# Patient Record
Sex: Male | Born: 1940 | Race: White | Hispanic: No | Marital: Single | Smoking: Never smoker
Health system: Southern US, Community
[De-identification: ages and names within clinical notes are randomized; demographics above are authoritative.]

## PROBLEM LIST (undated history)

## (undated) DIAGNOSIS — I10 Essential (primary) hypertension: Secondary | ICD-10-CM

## (undated) DIAGNOSIS — R7303 Prediabetes: Secondary | ICD-10-CM

## (undated) DIAGNOSIS — E785 Hyperlipidemia, unspecified: Secondary | ICD-10-CM

## (undated) DIAGNOSIS — R0609 Other forms of dyspnea: Secondary | ICD-10-CM

## (undated) DIAGNOSIS — N183 Chronic kidney disease, stage 3 unspecified: Secondary | ICD-10-CM

## (undated) DIAGNOSIS — M2550 Pain in unspecified joint: Secondary | ICD-10-CM

## (undated) DIAGNOSIS — G4730 Sleep apnea, unspecified: Secondary | ICD-10-CM

## (undated) DIAGNOSIS — F329 Major depressive disorder, single episode, unspecified: Secondary | ICD-10-CM

## (undated) DIAGNOSIS — M7989 Other specified soft tissue disorders: Secondary | ICD-10-CM

## (undated) DIAGNOSIS — N400 Enlarged prostate without lower urinary tract symptoms: Secondary | ICD-10-CM

## (undated) DIAGNOSIS — G5600 Carpal tunnel syndrome, unspecified upper limb: Secondary | ICD-10-CM

## (undated) DIAGNOSIS — F419 Anxiety disorder, unspecified: Secondary | ICD-10-CM

## (undated) DIAGNOSIS — R0600 Dyspnea, unspecified: Secondary | ICD-10-CM

## (undated) DIAGNOSIS — R0602 Shortness of breath: Secondary | ICD-10-CM

## (undated) DIAGNOSIS — M1990 Unspecified osteoarthritis, unspecified site: Secondary | ICD-10-CM

## (undated) DIAGNOSIS — M549 Dorsalgia, unspecified: Secondary | ICD-10-CM

## (undated) DIAGNOSIS — G709 Myoneural disorder, unspecified: Secondary | ICD-10-CM

## (undated) DIAGNOSIS — Z87442 Personal history of urinary calculi: Secondary | ICD-10-CM

## (undated) DIAGNOSIS — IMO0002 Reserved for concepts with insufficient information to code with codable children: Secondary | ICD-10-CM

## (undated) DIAGNOSIS — K219 Gastro-esophageal reflux disease without esophagitis: Secondary | ICD-10-CM

## (undated) DIAGNOSIS — J45909 Unspecified asthma, uncomplicated: Secondary | ICD-10-CM

## (undated) DIAGNOSIS — R1310 Dysphagia, unspecified: Secondary | ICD-10-CM

## (undated) DIAGNOSIS — Z8619 Personal history of other infectious and parasitic diseases: Secondary | ICD-10-CM

## (undated) DIAGNOSIS — T7840XA Allergy, unspecified, initial encounter: Secondary | ICD-10-CM

## (undated) DIAGNOSIS — K429 Umbilical hernia without obstruction or gangrene: Secondary | ICD-10-CM

## (undated) DIAGNOSIS — F32A Depression, unspecified: Secondary | ICD-10-CM

## (undated) HISTORY — DX: Sleep apnea, unspecified: G47.30

## (undated) HISTORY — DX: Myoneural disorder, unspecified: G70.9

## (undated) HISTORY — DX: Other forms of dyspnea: R06.09

## (undated) HISTORY — DX: Anxiety disorder, unspecified: F41.9

## (undated) HISTORY — DX: Dysphagia, unspecified: R13.10

## (undated) HISTORY — DX: Unspecified osteoarthritis, unspecified site: M19.90

## (undated) HISTORY — DX: Carpal tunnel syndrome, unspecified upper limb: G56.00

## (undated) HISTORY — PX: HERNIA REPAIR: SHX51

## (undated) HISTORY — DX: Hyperlipidemia, unspecified: E78.5

## (undated) HISTORY — DX: Essential (primary) hypertension: I10

## (undated) HISTORY — PX: AMPUTATION FINGER: SHX6594

## (undated) HISTORY — DX: Reserved for concepts with insufficient information to code with codable children: IMO0002

## (undated) HISTORY — DX: Allergy, unspecified, initial encounter: T78.40XA

## (undated) HISTORY — DX: Personal history of other infectious and parasitic diseases: Z86.19

## (undated) HISTORY — DX: Prediabetes: R73.03

## (undated) HISTORY — DX: Unspecified asthma, uncomplicated: J45.909

## (undated) HISTORY — DX: Shortness of breath: R06.02

## (undated) HISTORY — PX: CATARACT EXTRACTION, BILATERAL: SHX1313

## (undated) HISTORY — DX: Depression, unspecified: F32.A

## (undated) HISTORY — DX: Dyspnea, unspecified: R06.00

## (undated) HISTORY — DX: Pain in unspecified joint: M25.50

## (undated) HISTORY — PX: BACK SURGERY: SHX140

## (undated) HISTORY — PX: OTHER SURGICAL HISTORY: SHX169

## (undated) HISTORY — DX: Major depressive disorder, single episode, unspecified: F32.9

---

## 1998-03-21 ENCOUNTER — Inpatient Hospital Stay (HOSPITAL_COMMUNITY): Admission: RE | Admit: 1998-03-21 | Discharge: 1998-03-25 | Payer: Self-pay | Admitting: Neurosurgery

## 1998-08-14 ENCOUNTER — Ambulatory Visit (HOSPITAL_COMMUNITY): Admission: RE | Admit: 1998-08-14 | Discharge: 1998-08-14 | Payer: Self-pay | Admitting: Internal Medicine

## 1998-11-14 ENCOUNTER — Ambulatory Visit (HOSPITAL_COMMUNITY): Admission: RE | Admit: 1998-11-14 | Discharge: 1998-11-14 | Payer: Self-pay | Admitting: Neurosurgery

## 1998-11-14 ENCOUNTER — Encounter: Payer: Self-pay | Admitting: Neurosurgery

## 2000-05-20 ENCOUNTER — Encounter: Admission: RE | Admit: 2000-05-20 | Discharge: 2000-05-20 | Payer: Self-pay | Admitting: Neurosurgery

## 2000-05-20 ENCOUNTER — Encounter: Payer: Self-pay | Admitting: Neurosurgery

## 2000-05-27 ENCOUNTER — Encounter: Admission: RE | Admit: 2000-05-27 | Discharge: 2000-05-27 | Payer: Self-pay | Admitting: Emergency Medicine

## 2000-05-27 ENCOUNTER — Encounter: Payer: Self-pay | Admitting: Emergency Medicine

## 2000-10-14 ENCOUNTER — Encounter: Admission: RE | Admit: 2000-10-14 | Discharge: 2000-11-24 | Payer: Self-pay | Admitting: Neurology

## 2000-11-23 ENCOUNTER — Encounter: Payer: Self-pay | Admitting: Neurosurgery

## 2000-11-23 ENCOUNTER — Encounter: Admission: RE | Admit: 2000-11-23 | Discharge: 2000-11-23 | Payer: Self-pay | Admitting: Neurosurgery

## 2000-12-02 ENCOUNTER — Ambulatory Visit (HOSPITAL_COMMUNITY): Admission: RE | Admit: 2000-12-02 | Discharge: 2000-12-02 | Payer: Self-pay | Admitting: Neurosurgery

## 2000-12-02 ENCOUNTER — Encounter: Payer: Self-pay | Admitting: Neurosurgery

## 2001-05-31 ENCOUNTER — Encounter: Admission: RE | Admit: 2001-05-31 | Discharge: 2001-05-31 | Payer: Self-pay | Admitting: Emergency Medicine

## 2001-05-31 ENCOUNTER — Encounter: Payer: Self-pay | Admitting: Emergency Medicine

## 2001-11-10 HISTORY — PX: BACK SURGERY: SHX140

## 2001-11-10 HISTORY — PX: OTHER SURGICAL HISTORY: SHX169

## 2003-08-02 ENCOUNTER — Ambulatory Visit (HOSPITAL_COMMUNITY): Admission: RE | Admit: 2003-08-02 | Discharge: 2003-08-02 | Payer: Self-pay | Admitting: *Deleted

## 2005-01-14 ENCOUNTER — Ambulatory Visit: Payer: Self-pay | Admitting: Family Medicine

## 2005-02-12 ENCOUNTER — Ambulatory Visit (HOSPITAL_BASED_OUTPATIENT_CLINIC_OR_DEPARTMENT_OTHER): Admission: RE | Admit: 2005-02-12 | Discharge: 2005-02-12 | Payer: Self-pay | Admitting: Otolaryngology

## 2006-08-26 ENCOUNTER — Encounter: Payer: Self-pay | Admitting: Pulmonary Disease

## 2007-06-17 ENCOUNTER — Ambulatory Visit (HOSPITAL_COMMUNITY): Admission: RE | Admit: 2007-06-17 | Discharge: 2007-06-17 | Payer: Self-pay | Admitting: Cardiovascular Disease

## 2008-02-28 ENCOUNTER — Ambulatory Visit: Payer: Self-pay | Admitting: Pulmonary Disease

## 2008-02-28 DIAGNOSIS — E785 Hyperlipidemia, unspecified: Secondary | ICD-10-CM | POA: Insufficient documentation

## 2008-02-28 DIAGNOSIS — J309 Allergic rhinitis, unspecified: Secondary | ICD-10-CM | POA: Insufficient documentation

## 2008-02-28 DIAGNOSIS — G4730 Sleep apnea, unspecified: Secondary | ICD-10-CM | POA: Insufficient documentation

## 2008-03-29 ENCOUNTER — Ambulatory Visit: Payer: Self-pay | Admitting: Pulmonary Disease

## 2008-04-08 ENCOUNTER — Ambulatory Visit (HOSPITAL_COMMUNITY): Admission: RE | Admit: 2008-04-08 | Discharge: 2008-04-08 | Payer: Self-pay | Admitting: Family Medicine

## 2008-04-24 ENCOUNTER — Ambulatory Visit (HOSPITAL_COMMUNITY): Admission: RE | Admit: 2008-04-24 | Discharge: 2008-04-24 | Payer: Self-pay | Admitting: Family Medicine

## 2008-06-13 ENCOUNTER — Ambulatory Visit: Payer: Self-pay | Admitting: Pulmonary Disease

## 2008-12-07 ENCOUNTER — Encounter: Admission: RE | Admit: 2008-12-07 | Discharge: 2008-12-07 | Payer: Self-pay | Admitting: Surgery

## 2008-12-20 ENCOUNTER — Encounter: Admission: RE | Admit: 2008-12-20 | Discharge: 2008-12-20 | Payer: Self-pay | Admitting: Neurosurgery

## 2009-09-26 ENCOUNTER — Ambulatory Visit (HOSPITAL_COMMUNITY): Admission: RE | Admit: 2009-09-26 | Discharge: 2009-09-26 | Payer: Self-pay | Admitting: Neurosurgery

## 2010-12-01 ENCOUNTER — Encounter: Payer: Self-pay | Admitting: Family Medicine

## 2010-12-01 ENCOUNTER — Encounter: Payer: Self-pay | Admitting: Neurosurgery

## 2011-02-12 LAB — BASIC METABOLIC PANEL
CO2: 28 mEq/L (ref 19–32)
Calcium: 8.5 mg/dL (ref 8.4–10.5)
GFR calc Af Amer: >60 mL/min (ref >60)
GFR calc non Af Amer: 57 mL/min — ABNORMAL LOW (ref >60)
Potassium: 3.8 mEq/L (ref 3.5–5.1)
Sodium: 138 mEq/L (ref 135–145)

## 2011-02-12 LAB — CBC
HCT: 44.7 % (ref 39.0–52.0)
Hemoglobin: 15.4 g/dL (ref 13.0–17.0)
MCHC: 34.5 g/dL (ref 30.0–36.0)
RBC: 4.65 MIL/uL (ref 4.22–5.81)

## 2011-02-12 LAB — TYPE AND SCREEN: Antibody Screen: NEGATIVE

## 2011-03-25 NOTE — Cardiovascular Report (Signed)
NAMEHARVIN, KONICEK NO.:  [id number]   MEDICAL RECORD NO.:  [id number]          PATIENT TYPE:  OIB   LOCATION:  2899                         FACILITY:  [facility]   PHYSICIAN:  Nanetta Batty, M.D.   DATE OF BIRTH:  1941/06/14   DATE OF PROCEDURE:  06/17/2007  DATE OF DISCHARGE:                            CARDIAC CATHETERIZATION   [name] is a 70 year old white male patient of Dr. Ellis Parents referred for  tachypalpitations and chest pain.  He has a history of negative Myoview  several years ago.  His other problems include hyperlipidemia and  positive family for heart disease.  He presents now for diagnostic  coronary arteriography to define his anatomy and rule out ischemic  etiology.   PROCEDURE DESCRIPTION:  The patient is brought to the second floor [facility] cardiac catheterization laboratory in the postabsorptive state.  He  was premedicated with p.o. Valium.  His right groin was prepped and  shaved in the usual sterile fashion.  Xylocaine 1% was used for local  anesthesia.  A 6-French sheath was inserted into the right femoral  artery using standard Seldinger technique.  Six-French right and left  Judkins diagnostic catheters as well as French pigtail catheter were  used for selective cholangiography and left ventriculography  respectively.  Visipaque dye was used for the entirety of the case.  Retrograde aorta, ventricular and pullback pressures were recorded.   HEMODYNAMIC RESULTS:  1. Aortic systolic pressure 147, diastolic pressure 72.  2. Left ventricular systolic pressure 147, end-diastolic pressure 22.   SELECTIVE CHOLANGIOGRAPHY:  1. Left main normal.  2. LAD normal.  3. Left circumflex normal.  4. Right coronary artery is dominant and normal.   LEFT VENTRICULOGRAPHY:  RAO left ventriculogram was performed using 24  mL of Visipaque dye at 12 mL per second.  The overall LVEF was estimated  at greater than 60% without focal wall motion  abnormalities.   IMPRESSION:  Mr. [name] has essentially normal coronaries and normal left  ventricular function.  I believe his chest pain is noncardiac.  The  sheath was removed and pressure was held on the groin to achieve  hemostasis.  The patient left the lab in stable condition.  He will be  discharged home later today as an outpatient and will see me back in the  office next week.      Nanetta Batty, M.D.  Electronically Signed     JB/MEDQ  D:  06/17/2007  T:  06/17/2007  Job:  [id number]   cc:   2nd floor [facility]  [facility]  Brett Canales A. Cleta Alberts, M.D.

## 2011-03-28 NOTE — Procedures (Signed)
NAME:  [name], [name]               ACCOUNT NO.:  [id number]   MEDICAL RECORD NO.:  [id number]          PATIENT TYPE:  OUT   LOCATION:  SLEEP CENTER                 FACILITY:  [facility]   PHYSICIAN:  Clinton D. Maple Hudson, M.D. DATE OF BIRTH:  [birth year]   DATE OF STUDY:  02/12/2005                              NOCTURNAL POLYSOMNOGRAM   INDICATION FOR STUDY:  Hypersomnia with sleep apnea.  Epworth Sleepiness  Score 12/24, BMI 27, weight 200 pounds.   SLEEP ARCHITECTURE:  Total sleep time 297 minutes with sleep efficiency 83%.  Stage I was 30%, stage II 51%, stage III and IV were 6%, and REM was 13% of  total sleep time.  Sleep latency 4 minutes,  REM latency 90 minutes, awake  after sleep onset 17 minutes, arousal index 30.7 which is increased.  Took  Tylenol for leg pain.   RESPIRATORY DATA:  Respiratory disturbance index (RDI, AHI), 31.7  obstructive events per hour indicating moderately severe obstructive sleep  apnea/hypopnea syndrome.  There were 142 obstructive apneas and 15  hypopneas.  Most events were recorded while supine or on the left side.  REM  RDI 6.2.  Technician indicates patient did not maintain sleep sufficiently  to qualify for CPAP titration by split protocol, and the patient did not  like initial presentation of CPAP, stating his that his wife would give him  a hard time about it.   OXYGEN DATA:  Loud snoring with oxygen desaturation to a nadir of 74%.  Mean  oxygen saturation was 93% on room air.   CARDIAC DATA:  Normal sinus rhythm with occasional premature junctional  contraction.   MOVEMENT/PARASOMNIA:  Occasional leg jerk.   IMPRESSION/RECOMMENDATION:  1.  Moderately severe obstructive sleep apnea/hypopnea syndrome, RDI 31.7      per hour with loud snoring and oxygen desaturation to 74%.  2.  Use of Tylenol for leg pain.  3.  Consider return for CPAP titration or evaluate with alternative therapy      as appropriate.      CDY/MEDQ  D:  02/16/2005 13:17:24   T:  02/16/2005 18:27:12  Job:  [id number]

## 2011-07-09 ENCOUNTER — Emergency Department (HOSPITAL_COMMUNITY): Payer: Medicare Other

## 2011-07-09 ENCOUNTER — Emergency Department (HOSPITAL_COMMUNITY)
Admission: EM | Admit: 2011-07-09 | Discharge: 2011-07-09 | Discharge status: Against Medical Advice | Payer: Medicare Other | Attending: Emergency Medicine | Admitting: Emergency Medicine

## 2011-07-09 DIAGNOSIS — Z79899 Other long term (current) drug therapy: Secondary | ICD-10-CM | POA: Insufficient documentation

## 2011-07-09 DIAGNOSIS — M549 Dorsalgia, unspecified: Secondary | ICD-10-CM | POA: Insufficient documentation

## 2011-07-09 DIAGNOSIS — W208XXA Other cause of strike by thrown, projected or falling object, initial encounter: Secondary | ICD-10-CM | POA: Insufficient documentation

## 2011-07-09 DIAGNOSIS — S51809A Unspecified open wound of unspecified forearm, initial encounter: Secondary | ICD-10-CM | POA: Insufficient documentation

## 2011-07-09 DIAGNOSIS — G8929 Other chronic pain: Secondary | ICD-10-CM | POA: Insufficient documentation

## 2011-07-09 DIAGNOSIS — Y990 Civilian activity done for income or pay: Secondary | ICD-10-CM | POA: Insufficient documentation

## 2011-10-22 ENCOUNTER — Ambulatory Visit (INDEPENDENT_AMBULATORY_CARE_PROVIDER_SITE_OTHER): Payer: Medicare Other

## 2011-10-22 DIAGNOSIS — E236 Other disorders of pituitary gland: Secondary | ICD-10-CM

## 2011-11-09 ENCOUNTER — Ambulatory Visit (INDEPENDENT_AMBULATORY_CARE_PROVIDER_SITE_OTHER): Payer: Medicare Other

## 2011-11-09 DIAGNOSIS — J069 Acute upper respiratory infection, unspecified: Secondary | ICD-10-CM

## 2011-11-09 DIAGNOSIS — E236 Other disorders of pituitary gland: Secondary | ICD-10-CM

## 2011-11-14 DIAGNOSIS — IMO0002 Reserved for concepts with insufficient information to code with codable children: Secondary | ICD-10-CM | POA: Diagnosis not present

## 2011-11-14 DIAGNOSIS — M47817 Spondylosis without myelopathy or radiculopathy, lumbosacral region: Secondary | ICD-10-CM | POA: Diagnosis not present

## 2011-12-08 ENCOUNTER — Ambulatory Visit (INDEPENDENT_AMBULATORY_CARE_PROVIDER_SITE_OTHER): Payer: Medicare Other

## 2011-12-08 DIAGNOSIS — E329 Disease of thymus, unspecified: Secondary | ICD-10-CM

## 2011-12-17 DIAGNOSIS — IMO0002 Reserved for concepts with insufficient information to code with codable children: Secondary | ICD-10-CM | POA: Diagnosis not present

## 2011-12-17 DIAGNOSIS — M47817 Spondylosis without myelopathy or radiculopathy, lumbosacral region: Secondary | ICD-10-CM | POA: Diagnosis not present

## 2011-12-25 ENCOUNTER — Ambulatory Visit (INDEPENDENT_AMBULATORY_CARE_PROVIDER_SITE_OTHER): Payer: Medicare Other | Admitting: Emergency Medicine

## 2011-12-25 DIAGNOSIS — I779 Disorder of arteries and arterioles, unspecified: Secondary | ICD-10-CM | POA: Diagnosis not present

## 2011-12-25 DIAGNOSIS — M79609 Pain in unspecified limb: Secondary | ICD-10-CM

## 2011-12-25 DIAGNOSIS — I739 Peripheral vascular disease, unspecified: Secondary | ICD-10-CM

## 2011-12-25 DIAGNOSIS — M79606 Pain in leg, unspecified: Secondary | ICD-10-CM

## 2011-12-25 DIAGNOSIS — E291 Testicular hypofunction: Secondary | ICD-10-CM | POA: Diagnosis not present

## 2011-12-25 MED ORDER — TESTOSTERONE ENANTHATE 200 MG/ML IM SOLN
150.0000 mg | Freq: Once | INTRAMUSCULAR | Status: AC
  Administered 2011-12-25: 150 mg via INTRAMUSCULAR

## 2011-12-25 NOTE — Progress Notes (Signed)
  Subjective:    Patient ID: [name], male    DOB: 1941-10-28, 71 y.o.   MRN: [id number]  HPI patient has been to his neurologist. He received epidural injections. He has received facet injections he is having persistent pain in his back and down the right leg. When he spoke with the neurologist he commented on the vascular changes of his right leg and suggested he get that checked.    Review of Systems     Objective:   Physical Exam the dorsalis pedis and posterior tibial pulses of the right leg are normal. There are venous stasis changes involving the right lower leg.        Assessment & Plan:  My assessment is that the pain in his leg is not related to a vascular component. He does have venous stasis changes to suggest to do arterial and venous studies at the suggestion of his neurologist.

## 2012-01-03 ENCOUNTER — Other Ambulatory Visit: Payer: Self-pay | Admitting: Emergency Medicine

## 2012-01-04 ENCOUNTER — Other Ambulatory Visit: Payer: Self-pay | Admitting: Internal Medicine

## 2012-01-04 MED ORDER — DOXAZOSIN MESYLATE 8 MG PO TABS: 8.0000 mg | ORAL_TABLET | Freq: Every day | ORAL | Status: DC

## 2012-01-05 ENCOUNTER — Other Ambulatory Visit: Payer: Self-pay | Admitting: Emergency Medicine

## 2012-01-13 ENCOUNTER — Ambulatory Visit (INDEPENDENT_AMBULATORY_CARE_PROVIDER_SITE_OTHER): Payer: Medicare Other | Admitting: Physician Assistant

## 2012-01-13 VITALS — BP 136/80 | HR 80 | Temp 97.9°F | Resp 16 | Ht 69.5 in | Wt 225.0 lb

## 2012-01-13 DIAGNOSIS — E291 Testicular hypofunction: Secondary | ICD-10-CM

## 2012-01-13 MED ORDER — TESTOSTERONE CYPIONATE 200 MG/ML IM SOLN
300.0000 mg | INTRAMUSCULAR | Status: DC
  Administered 2012-01-13: 300 mg via INTRAMUSCULAR

## 2012-01-13 NOTE — Progress Notes (Signed)
  Subjective:    Patient ID: [name], male    DOB: [birth year], 71 y.o.   MRN: [id number]  HPI Here for testosterone injection.    Review of Systems     Objective:   Physical Exam        Assessment & Plan:  Ok for 1.5 ml testosterone injection

## 2012-01-14 ENCOUNTER — Ambulatory Visit
Admission: RE | Admit: 2012-01-14 | Discharge: 2012-01-14 | Disposition: A | Discharge status: Home / Self Care | Payer: Self-pay | Source: Ambulatory Visit | Attending: Emergency Medicine | Admitting: Emergency Medicine

## 2012-01-14 DIAGNOSIS — M79609 Pain in unspecified limb: Secondary | ICD-10-CM | POA: Diagnosis not present

## 2012-01-14 DIAGNOSIS — I739 Peripheral vascular disease, unspecified: Secondary | ICD-10-CM

## 2012-01-14 DIAGNOSIS — M79606 Pain in leg, unspecified: Secondary | ICD-10-CM

## 2012-01-14 DIAGNOSIS — R609 Edema, unspecified: Secondary | ICD-10-CM | POA: Diagnosis not present

## 2012-01-15 ENCOUNTER — Ambulatory Visit
Admission: RE | Admit: 2012-01-15 | Discharge: 2012-01-15 | Disposition: A | Discharge status: Home / Self Care | Payer: Self-pay | Source: Ambulatory Visit | Attending: Emergency Medicine | Admitting: Emergency Medicine

## 2012-01-15 ENCOUNTER — Telehealth: Payer: Self-pay | Admitting: Radiology

## 2012-01-15 DIAGNOSIS — I739 Peripheral vascular disease, unspecified: Secondary | ICD-10-CM

## 2012-01-15 DIAGNOSIS — M79606 Pain in leg, unspecified: Secondary | ICD-10-CM

## 2012-01-15 DIAGNOSIS — M79609 Pain in unspecified limb: Secondary | ICD-10-CM | POA: Diagnosis not present

## 2012-01-15 NOTE — Telephone Encounter (Signed)
LMOM TO INFORM PT OF HIS NORMAL RESULTS.

## 2012-01-15 NOTE — Telephone Encounter (Signed)
Message copied by Luretha Murphy on Thu Jan 15, 2012  9:44 AM ------      Message from: Lesle Chris A      Created: Thu Jan 15, 2012  9:29 AM       Please call [name] and let him know there was no there is no evidence of clot on his of ultrasound.

## 2012-01-15 NOTE — Telephone Encounter (Signed)
Message copied by Luretha Murphy on Thu Jan 15, 2012  9:45 AM ------      Message from: Lesle Chris A      Created: Thu Jan 15, 2012  9:29 AM       Please call Mr. Dingley and let him know there was no there is no evidence of clot on his of ultrasound.

## 2012-01-25 ENCOUNTER — Telehealth: Payer: Self-pay

## 2012-01-25 NOTE — Telephone Encounter (Signed)
Dr Cleta Alberts Needs 2 referrals. Was here about 2 wks ago and talked to dr Cleta Alberts about possible referrals. 1)blistering on legs 2)refer to dr Lovell Sheehan (neuro). Has been to this office years ago. They told him best to get new referral

## 2012-01-25 NOTE — Telephone Encounter (Signed)
Chart in Daub box.

## 2012-01-26 ENCOUNTER — Other Ambulatory Visit: Payer: Self-pay | Admitting: Emergency Medicine

## 2012-01-26 DIAGNOSIS — M545 Low back pain, unspecified: Secondary | ICD-10-CM

## 2012-01-26 DIAGNOSIS — I878 Other specified disorders of veins: Secondary | ICD-10-CM

## 2012-01-26 NOTE — Telephone Encounter (Signed)
Notified pt that Dr Cleta Alberts has started the referrals for the two specialists he was requesting to see. Pt agreed

## 2012-01-30 ENCOUNTER — Other Ambulatory Visit: Payer: Self-pay

## 2012-01-30 DIAGNOSIS — I872 Venous insufficiency (chronic) (peripheral): Secondary | ICD-10-CM

## 2012-02-04 ENCOUNTER — Ambulatory Visit (INDEPENDENT_AMBULATORY_CARE_PROVIDER_SITE_OTHER): Payer: Medicare Other | Admitting: Family Medicine

## 2012-02-04 DIAGNOSIS — R140 Abdominal distension (gaseous): Secondary | ICD-10-CM

## 2012-02-04 DIAGNOSIS — E236 Other disorders of pituitary gland: Secondary | ICD-10-CM | POA: Diagnosis not present

## 2012-02-04 DIAGNOSIS — E291 Testicular hypofunction: Secondary | ICD-10-CM

## 2012-02-04 DIAGNOSIS — R5383 Other fatigue: Secondary | ICD-10-CM

## 2012-02-04 DIAGNOSIS — R5381 Other malaise: Secondary | ICD-10-CM

## 2012-02-04 MED ORDER — TESTOSTERONE CYPIONATE 200 MG/ML IM SOLN
300.0000 mg | INTRAMUSCULAR | Status: DC
  Administered 2012-02-04: 300 mg via INTRAMUSCULAR

## 2012-02-04 MED ORDER — TESTOSTERONE CYPIONATE 200 MG/ML IM SOLN: INTRAMUSCULAR | Status: DC

## 2012-02-04 NOTE — Progress Notes (Signed)
Addended by: Elvina Sidle on: 02/04/2012 08:13 PM   Modules accepted: Orders, Level of Service

## 2012-02-04 NOTE — Progress Notes (Addendum)
Ok for testosterone injection. Has a standing order for injection every 3 weeks.  [name]  [name] gets 1.5 cc of Dep0- testosterone 200 mg per mL every 3 weeks. He's been doing fine on this regimen.  Has to speak to me tonight because he's been getting extremely tired after eating a heavy meal. He finds that if he has big breakfast with cereal he cannot function for hours and has to lie down. He knows he notices the same thing but other mealtimes. If he has a light meal he has not have this quite fatigued.  Objective: I spent 40 minutes with [name] discussing diet and suggesting that he cut down on his carbohydrates and to lose some weight.  Assessment: Patient probably has prediabetes and needs to trim down. He understands this is going to try changing his diet and lose weight.

## 2012-02-11 ENCOUNTER — Encounter: Payer: Self-pay | Admitting: Vascular Surgery

## 2012-02-12 ENCOUNTER — Encounter (INDEPENDENT_AMBULATORY_CARE_PROVIDER_SITE_OTHER): Payer: Medicare Other | Admitting: *Deleted

## 2012-02-12 ENCOUNTER — Ambulatory Visit (INDEPENDENT_AMBULATORY_CARE_PROVIDER_SITE_OTHER): Payer: Medicare Other | Admitting: Vascular Surgery

## 2012-02-12 ENCOUNTER — Encounter: Payer: Self-pay | Admitting: Vascular Surgery

## 2012-02-12 VITALS — BP 125/76 | HR 83 | Resp 16 | Ht 71.0 in | Wt 232.2 lb

## 2012-02-12 DIAGNOSIS — M79609 Pain in unspecified limb: Secondary | ICD-10-CM | POA: Insufficient documentation

## 2012-02-12 DIAGNOSIS — I872 Venous insufficiency (chronic) (peripheral): Secondary | ICD-10-CM | POA: Diagnosis not present

## 2012-02-12 NOTE — Progress Notes (Signed)
[facility] HISTORY AND PHYSICAL   History of Present Illness:  Patient is a 71 y.o. year old male who presents for evaluation of leg swelling burning and aching.  The patient states his right leg is worse than his left. This is been going on for several years. He also has severe degenerative disc disease and facet multiple procedures related to this. He occasionally has some numbness and tingling in his right foot.  He also has difficulty healing up ulcers in his right leg. He has had several exacerbations and remissions of this. He currently has no open ulcers. He denies any prior history of DVT. He has been placed on diuretics in the past for leg swelling. He's had no prior lower extremity interventions. His legs get full heavy and more achy after he is on his feet all day. He also complains of a warm sensation in his right foot occasionally followed by very cool sensations and other temperature type sensations.The swelling and aching is somewhat relieved by elevating his legs. Other medical problems include sleep apnea and degenerative neck and lumbar spine disease.   Past Medical History  Diagnosis Date  . Arthritis   . 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 Sleep apnea   . 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 Epidural steroid injections     every 6 months   Dr. Despina Arias  . Hernia repair 2003  . Back surgery 2003    L4, L5     Social History History  Substance Use Topics  . Smoking status: Never Smoker   . Smokeless tobacco: Not on file  . Alcohol Use: Not on file    Family History No family history on file.  Allergies  Allergies  Allergen Reactions  . Keflex      Current Outpatient Prescriptions  Medication Sig Dispense Refill  . buPROPion (WELLBUTRIN XL) 300 MG 24 hr tablet TAKE 1 TABLET BY MOUTH ONCE A DAY  30 tablet  0  . doxazosin (CARDURA) 8 MG tablet Take 1  tablet (8 mg total) by mouth at bedtime.  30 tablet  3  . testosterone cypionate (DEPOTESTOTERONE CYPIONATE) 200 MG/ML injection 1.5 ml q3 weeks  10 mL  0    ROS:   General:  No weight loss, Fever, chills  HEENT: No recent headaches, no nasal bleeding, no visual changes, no sore throat  Neurologic: No dizziness, blackouts, seizures. No recent symptoms of stroke or mini- stroke. No recent episodes of slurred speech, or temporary blindness.  Cardiac: No recent episodes of chest pain/pressure, no shortness of breath at rest.  + shortness of breath with exertion.  Denies history of atrial fibrillation or irregular heartbeat  Vascular: No history of rest pain in feet.  No history of claudication.  + history of non-healing ulcer, No history of DVT   Pulmonary: No home oxygen, no productive cough, no hemoptysis,  No asthma or wheezing  Musculoskeletal:  [ ]  Arthritis, [x ] Low back pain,  [x ] Joint pain  Hematologic:No history of hypercoagulable state.  No history of easy bleeding.  No history of anemia  Gastrointestinal: No hematochezia or melena,  + gastroesophageal reflux, no trouble swallowing  Urinary: [ ]  chronic Kidney disease, [ ]  on HD - [ ]  MWF or [ ]  TTHS, [ ]  Burning with urination, [ ]  Frequent urination, [ ]  Difficulty urinating;  Skin: + rashes occasionally gaiter area bilat legs  Psychological: No history of anxiety,  No history of depression   Physical Examination  Filed Vitals:   02/12/12 1326  BP: 125/76  Pulse: 83  Resp: 16  Height: 5\' 11"  (1.803 m)  Weight: 232 lb 3.2 oz (105.325 kg)   General:  Alert and oriented, no acute distress HEENT: Normal Neck: No bruit or JVD Pulmonary: Clear to auscultation bilaterally Cardiac: Regular Rate and Rhythm without murmur Abdomen: Soft, non-tender, non-distended, no mass, no scars Skin: No rash, brawny staining and dry skin bilat lower extremity calf area, no obvious varicosities Extremity Pulses:  2+ radial,  brachial, femoral, dorsalis pedis, posterior tibial pulses bilaterally Musculoskeletal: No deformity biltat lower extremity trace edema  Neurologic: Upper and lower extremity motor 5/5 and symmetric  DATA: Had a venous duplex exam performed of his right lower extremity today. This showed reflux in the deep and superficial systems. The right greater saphenous is incompetent throughout its course.   ASSESSMENT: Bilateral lower extremity venous incompetence right greater than left with duplex evidence of right greater saphenous vein reflux and also deep vein reflux   PLAN:  Pathophysiology of venous disease was discussed with the patient today. I discussed with him wearing bilateral compression stockings. Because of his back problems he does not know if he will be only get these on his lower extremities as he has difficulty placing regular socks on his feet.  He will try 30 mm compression to see if he can get the stockings on. We could consider laser ablation of his greater saphenous vein at some point future if he does not have improvement with compression alone. I do not believe the burning sensation in his foot and the changes of cold and heat temperature in his foot are related to his venous disease. This is much more likely neuropathy or neurologic in nature. His arterial tree has intact pulses all the way down to his feet. I do not believe he has arterial etiology of his symptoms. He will followup on as-needed basis.  Fabienne Bruns, MD [facility] Office: [phone] Pager: [phone]

## 2012-02-20 NOTE — Procedures (Unsigned)
LOWER EXTREMITY VENOUS REFLUX EXAM  INDICATION:  Right lower extremity venous stasis.  EXAM:  Using color-flow imaging and pulse Doppler spectral analysis, the right common femoral, femoral, popliteal, posterior tibial, great and small saphenous veins are evaluated.  There is evidence suggesting deep venous insufficiency in the right lower extremity.  The right saphenofemoral junction is not competent with Reflux of >543milliseconds. The right great saphenous vein is not competent with Reflux of >569milliseconds with the caliber as described below.   The right proximal small saphenous vein demonstrates competency.  GSV Diameter (used if found to be incompetent only)                                           Right    Left Proximal Greater Saphenous Vein           0.65 cm  cm Proximal-to-mid-thigh                     0.51 cm  cm Mid thigh                                 0.45 cm  cm Mid-distal thigh                          cm       cm Distal thigh                              0.34 cm  cm Knee                                      0.39 cm  cm  IMPRESSION: 1. The right great saphenous vein is not competent with Reflux of     >545milliseconds. 2. The right great saphenous vein is not tortuous. 3. The right deep venous system is not competent with Reflux of     >540milliseconds. 4. The right small saphenous vein is competent.  ___________________________________________ Janetta Hora. Fields, MD  EM/MEDQ  D:  02/12/2012  T:  02/12/2012  Job:  [id number]

## 2012-02-26 ENCOUNTER — Telehealth: Payer: Self-pay

## 2012-02-26 NOTE — Telephone Encounter (Signed)
Pt CB and I gave him instr's from Dr Cleta Alberts. Pt agreed

## 2012-02-26 NOTE — Telephone Encounter (Signed)
Please call OK to  take diuretic twice a day. He is return to clinic 2 weeks for b.loodwork

## 2012-02-26 NOTE — Telephone Encounter (Signed)
Please advise on this.  

## 2012-02-26 NOTE — Telephone Encounter (Signed)
LMOM to call back

## 2012-02-26 NOTE — Telephone Encounter (Signed)
Dr. Cleta Alberts   Please is asking if he can increase his medications to two a day.  States the water retention in his legs is keeping them very swollen.  Please call [phone]

## 2012-02-29 ENCOUNTER — Ambulatory Visit (INDEPENDENT_AMBULATORY_CARE_PROVIDER_SITE_OTHER): Payer: Medicare Other | Admitting: Emergency Medicine

## 2012-02-29 VITALS — BP 128/68 | HR 70 | Temp 98.1°F | Resp 16 | Ht 69.5 in | Wt 235.0 lb

## 2012-02-29 DIAGNOSIS — E291 Testicular hypofunction: Secondary | ICD-10-CM

## 2012-02-29 MED ORDER — TESTOSTERONE CYPIONATE 200 MG/ML IM SOLN
300.0000 mg | INTRAMUSCULAR | Status: AC
  Administered 2012-02-29: 300 mg via INTRAMUSCULAR

## 2012-03-15 ENCOUNTER — Other Ambulatory Visit: Payer: Self-pay | Admitting: Emergency Medicine

## 2012-03-15 ENCOUNTER — Telehealth: Payer: Self-pay

## 2012-03-15 ENCOUNTER — Other Ambulatory Visit: Payer: Self-pay | Admitting: Physician Assistant

## 2012-03-15 ENCOUNTER — Other Ambulatory Visit: Payer: Self-pay | Admitting: Internal Medicine

## 2012-03-15 MED ORDER — BUPROPION HCL ER (XL) 300 MG PO TB24: 300.0000 mg | ORAL_TABLET | ORAL | Status: DC

## 2012-03-15 MED ORDER — DOXAZOSIN MESYLATE 8 MG PO TABS: 8.0000 mg | ORAL_TABLET | Freq: Every day | ORAL | Status: DC

## 2012-03-15 MED ORDER — FUROSEMIDE 40 MG PO TABS: 40.0000 mg | ORAL_TABLET | Freq: Every day | ORAL | Status: DC

## 2012-03-15 NOTE — Telephone Encounter (Signed)
Pt called multiple times asking about his medication RFs to [facility]. Rxs were RFd, but only for 30 days instead of 90 days d/t pt being overdue for f/up on his meds. Pt has been in several times for testosterone shot and/or acute problems, but not for labs/ med f/up. CPE due June/July. Pt states it costs him a lot more $ that he does not have for 30 days at a time. Pt does not understand why he wasn't told at any of his recent visits that he was due for med f/up. Dr Cleta Alberts, do you want to OK the RFs for 90 days on pt's Wellbutrin, Cardura, and Lasix, and when does he need to f/up for these?

## 2012-03-15 NOTE — Telephone Encounter (Signed)
Spoke with Dr. Cleta Alberts, he is ok with doing 90 day rx and having patient RTC for appt.  Patient notified and will call back to schedule appt.

## 2012-03-16 ENCOUNTER — Ambulatory Visit (INDEPENDENT_AMBULATORY_CARE_PROVIDER_SITE_OTHER): Payer: Medicare Other | Admitting: Family Medicine

## 2012-03-16 VITALS — BP 145/88 | HR 73 | Temp 97.7°F | Resp 18 | Ht 69.0 in | Wt 235.0 lb

## 2012-03-16 DIAGNOSIS — L02619 Cutaneous abscess of unspecified foot: Secondary | ICD-10-CM

## 2012-03-16 DIAGNOSIS — L0390 Cellulitis, unspecified: Secondary | ICD-10-CM

## 2012-03-16 DIAGNOSIS — E236 Other disorders of pituitary gland: Secondary | ICD-10-CM

## 2012-03-16 DIAGNOSIS — I10 Essential (primary) hypertension: Secondary | ICD-10-CM | POA: Diagnosis not present

## 2012-03-16 DIAGNOSIS — L03119 Cellulitis of unspecified part of limb: Secondary | ICD-10-CM | POA: Diagnosis not present

## 2012-03-16 DIAGNOSIS — F329 Major depressive disorder, single episode, unspecified: Secondary | ICD-10-CM

## 2012-03-16 DIAGNOSIS — R600 Localized edema: Secondary | ICD-10-CM

## 2012-03-16 DIAGNOSIS — R5383 Other fatigue: Secondary | ICD-10-CM

## 2012-03-16 DIAGNOSIS — M549 Dorsalgia, unspecified: Secondary | ICD-10-CM

## 2012-03-16 DIAGNOSIS — R5381 Other malaise: Secondary | ICD-10-CM | POA: Diagnosis not present

## 2012-03-16 DIAGNOSIS — F410 Panic disorder [episodic paroxysmal anxiety] without agoraphobia: Secondary | ICD-10-CM

## 2012-03-16 DIAGNOSIS — E291 Testicular hypofunction: Secondary | ICD-10-CM

## 2012-03-16 DIAGNOSIS — F32A Depression, unspecified: Secondary | ICD-10-CM

## 2012-03-16 LAB — TSH: TSH: 1.404 u[IU]/mL (ref 0.350–4.500)

## 2012-03-16 MED ORDER — HYDROCODONE-ACETAMINOPHEN 5-500 MG PO CAPS: 1.0000 | ORAL_CAPSULE | Freq: Three times a day (TID) | ORAL | Status: AC | PRN

## 2012-03-16 MED ORDER — LORAZEPAM 1 MG PO TABS: 0.5000 mg | ORAL_TABLET | Freq: Two times a day (BID) | ORAL | Status: AC | PRN

## 2012-03-16 MED ORDER — TESTOSTERONE CYPIONATE 200 MG/ML IM SOLN
300.0000 mg | INTRAMUSCULAR | Status: AC
  Administered 2012-03-16 – 2012-04-07 (×2): 300 mg via INTRAMUSCULAR
  Administered 2012-06-08: 220 mg via INTRAMUSCULAR
  Administered 2012-06-28: 300 mg via INTRAMUSCULAR

## 2012-03-16 MED ORDER — DOXAZOSIN MESYLATE 8 MG PO TABS: 8.0000 mg | ORAL_TABLET | Freq: Every day | ORAL | Status: DC

## 2012-03-16 MED ORDER — BUPROPION HCL ER (XL) 300 MG PO TB24: 300.0000 mg | ORAL_TABLET | ORAL | Status: DC

## 2012-03-16 MED ORDER — MUPIROCIN 2 % EX OINT: TOPICAL_OINTMENT | Freq: Two times a day (BID) | CUTANEOUS | Status: AC

## 2012-03-16 MED ORDER — FUROSEMIDE 40 MG PO TABS: 40.0000 mg | ORAL_TABLET | Freq: Every day | ORAL | Status: DC

## 2012-03-16 NOTE — Progress Notes (Signed)
Mr. [name] is a 71 year old single man who works in an [employer] business. He comes in today to get his testosterone shot and discuss nonhealing sores on his legs. He's had these sores on both of his shins for about a month now. They have slowly healed somewhat but the rate of healing has been slow and every time he bumps his shins the scabs come off and he starts bleeding again.  Patient also notes quite a bit of fatigue in the last month or 2 and he would like to see if the testosterone is the appropriate level.  Patient also needs his medications refilled. He has panic disorder for which he takes for his apparent, anxiety and depression for which he takes bupropion, hydrocodone for his back pain which he takes once a day, and blood pressure medicine. He is now taking Prilosec over-the-counter which is controlling his reflux and gagging reflex. He notes that if he doesn't take this regularly, after 2 or 3 days he starts to have his symptoms returned.  Objective: No acute distress, talkative gentleman, overweight, HEENT: Unremarkable  Chest: Clear to auscultation  Heart: Regular no murmur or gallop  Skin: Patient has 2+ ankle edema bilaterally with superficial ulcerations on both shins worse on the right  Patient is wearing a back brace  Assessment: Hypergonadism, fatigue, panic disorder, chronic pain, anxiety depression, and edema  Plan: Check labs: Testosterone and TSH Give testosterone shot Refill medicines Followup 3 weeks

## 2012-03-17 ENCOUNTER — Encounter: Payer: Medicare Other | Admitting: Vascular Surgery

## 2012-03-17 LAB — TESTOSTERONE, FREE, TOTAL, SHBG
Sex Hormone Binding: 26 nmol/L (ref 13–71)
Testosterone, Free: 86.2 pg/mL (ref 47.0–244.0)
Testosterone-% Free: 2.3 % (ref 1.6–2.9)
Testosterone: 374.99 ng/dL (ref 300–890)

## 2012-03-29 ENCOUNTER — Telehealth: Payer: Self-pay

## 2012-03-29 NOTE — Telephone Encounter (Signed)
Patient received message from Dr. Milus Glazier regarding testosterone levels. Would like call back, should he be increasing dosage or frequency of injections?

## 2012-03-30 NOTE — Telephone Encounter (Signed)
Advised pt of your note. Just FYI pt feels that he should increase his dose since he is in the low normal range.  He will be contacting urologist.

## 2012-03-30 NOTE — Telephone Encounter (Signed)
PLEASE ADVISE.

## 2012-03-30 NOTE — Telephone Encounter (Signed)
Continue current dose.  Testosterone adequate.

## 2012-03-31 ENCOUNTER — Telehealth: Payer: Self-pay | Admitting: Family Medicine

## 2012-03-31 ENCOUNTER — Encounter: Payer: Medicare Other | Admitting: Physician Assistant

## 2012-04-01 ENCOUNTER — Telehealth: Payer: Self-pay

## 2012-04-01 NOTE — Telephone Encounter (Signed)
Dr. Milus Glazier pt returned your phone call.

## 2012-04-02 NOTE — Telephone Encounter (Signed)
I will try to call patient this weekend.  I have been extremely busy with some very sick patients the last week.

## 2012-04-02 NOTE — Telephone Encounter (Signed)
Dr. Elbert Ewings - please call patient again [phone]

## 2012-04-05 NOTE — Telephone Encounter (Signed)
LMOM for pt that Dr L has not forgotten him and had been very busy will several very sick pts last week,  and will try to give him a call in next couple of days to discuss his ?s.

## 2012-04-06 NOTE — Telephone Encounter (Signed)
Called pt to make sure his number is correct and pt stated his number is correct in system and Dr L had just tried an incorrect number. Pt stated that he really does not need for Dr L to call him back though. He doesn't need to ask him any ?s at this time.

## 2012-04-07 ENCOUNTER — Ambulatory Visit (INDEPENDENT_AMBULATORY_CARE_PROVIDER_SITE_OTHER): Payer: Medicare Other | Admitting: Emergency Medicine

## 2012-04-07 VITALS — BP 144/69 | HR 79 | Temp 97.9°F | Resp 16 | Ht 69.5 in | Wt 239.0 lb

## 2012-04-07 DIAGNOSIS — R609 Edema, unspecified: Secondary | ICD-10-CM | POA: Diagnosis not present

## 2012-04-07 DIAGNOSIS — R600 Localized edema: Secondary | ICD-10-CM

## 2012-04-07 DIAGNOSIS — E236 Other disorders of pituitary gland: Secondary | ICD-10-CM

## 2012-04-07 DIAGNOSIS — E291 Testicular hypofunction: Secondary | ICD-10-CM

## 2012-04-07 LAB — POCT URINALYSIS DIPSTICK
Bilirubin, UA: NEGATIVE
Blood, UA: NEGATIVE
Leukocytes, UA: NEGATIVE
Nitrite, UA: NEGATIVE
Protein, UA: NEGATIVE
Urobilinogen, UA: 0.2
pH, UA: 7

## 2012-04-07 LAB — POCT CBC
Hemoglobin: 16.3 g/dL (ref 14.1–18.1)
Lymph, poc: 1.3 (ref 0.6–3.4)
MCH, POC: 31.8 pg — AB (ref 27–31.2)
MCHC: 32.7 g/dL (ref 31.8–35.4)
MID (cbc): 0.4 (ref 0–0.9)
MPV: 8.7 fL (ref 0–99.8)
POC Granulocyte: 5 (ref 2–6.9)
POC MID %: 6.5 %M (ref 0–12)
Platelet Count, POC: 229 10*3/uL (ref 142–424)
RDW, POC: 13.1 %
WBC: 6.7 10*3/uL (ref 4.6–10.2)

## 2012-04-07 LAB — BASIC METABOLIC PANEL
BUN: 15 mg/dL (ref 6–23)
Calcium: 9.1 mg/dL (ref 8.4–10.5)
Chloride: 99 mEq/L (ref 96–112)
Creat: 1.56 mg/dL — ABNORMAL HIGH (ref 0.50–1.35)

## 2012-04-07 MED ORDER — FUROSEMIDE 40 MG PO TABS: ORAL_TABLET | ORAL | Status: DC

## 2012-04-07 MED ORDER — POTASSIUM CHLORIDE CRYS ER 20 MEQ PO TBCR: 20.0000 meq | EXTENDED_RELEASE_TABLET | Freq: Two times a day (BID) | ORAL | Status: DC

## 2012-04-07 NOTE — Progress Notes (Signed)
  Subjective:    Patient ID: [name], male    DOB: [birth year], 71 y.o.   MRN: [id number]  HPI patient enters because he has had progressive swelling in his legs. Initially he had a lot of swelling in the right leg. He subsequently had venous Dopplers done of the legs and they revealed no evidence of clot. He also had arterial Dopplers done these were normal. The swelling now is primarily related to left leg. He had an injury to the left knee and required wearing an Ace wrap at work.    Review of Systems     Objective:   Physical Exam patient is alert and cooperative and not in any distress. He has a stasis ulcer medial side of the right lower leg with venous stasis changes in bilateral varicosities. Left leg has 2+ pitting edema which extends up to just below the patella. Examination of the knee reveals some swelling and medial joint tenderness.        Assessment & Plan:  Patient was requesting increased dose and his testosterone but I advised him that his level was normal and I would not increase the dose. He is going to check a go for medical to see if he might find a pair of support hose that he would be comfortable getting along. Increase in peripheral edema he is to weigh himself regular. We'll go ahead and put him on Lasix 40 twice a day and K-Lor con 20 mEq twice a day. He is to weigh himself regularly and give me a followup call in approximately 2 weeks to give me a progress report about his weight and his edema.

## 2012-04-12 ENCOUNTER — Telehealth: Payer: Self-pay

## 2012-04-12 NOTE — Telephone Encounter (Signed)
PT STATES WE HAD CALLED HIM AND HE WAS PRETTY SURE IT WAS REGARDING HIS LABS PLEASE CALL [phone]

## 2012-04-13 NOTE — Telephone Encounter (Signed)
Pt notified about his labs

## 2012-04-22 ENCOUNTER — Ambulatory Visit: Payer: Medicare Other

## 2012-04-22 ENCOUNTER — Encounter: Payer: Self-pay | Admitting: Emergency Medicine

## 2012-04-22 ENCOUNTER — Ambulatory Visit (INDEPENDENT_AMBULATORY_CARE_PROVIDER_SITE_OTHER): Payer: Medicare Other | Admitting: Emergency Medicine

## 2012-04-22 VITALS — BP 147/70 | HR 73 | Temp 98.1°F | Resp 18 | Ht 69.5 in | Wt 237.0 lb

## 2012-04-22 DIAGNOSIS — R609 Edema, unspecified: Secondary | ICD-10-CM | POA: Diagnosis not present

## 2012-04-22 LAB — COMPREHENSIVE METABOLIC PANEL
Albumin: 3.9 g/dL (ref 3.5–5.2)
BUN: 16 mg/dL (ref 6–23)
CO2: 29 mEq/L (ref 19–32)
Glucose, Bld: 93 mg/dL (ref 70–99)
Sodium: 139 mEq/L (ref 135–145)
Total Bilirubin: 0.5 mg/dL (ref 0.3–1.2)
Total Protein: 6.6 g/dL (ref 6.0–8.3)

## 2012-04-22 LAB — POCT CBC
HCT, POC: 45.9 % (ref 43.5–53.7)
Hemoglobin: 15.1 g/dL (ref 14.1–18.1)
Lymph, poc: 1.5 (ref 0.6–3.4)
MCHC: 32.9 g/dL (ref 31.8–35.4)
MCV: 95.8 fL (ref 80–97)
POC Granulocyte: 4.3 (ref 2–6.9)
POC LYMPH PERCENT: 23.2 %L (ref 10–50)
RDW, POC: 13.7 %
WBC: 6.4 10*3/uL (ref 4.6–10.2)

## 2012-04-22 NOTE — Progress Notes (Signed)
  Subjective:    Patient ID: [name], male    DOB: 11-04-1941, 71 y.o.   MRN: [id number]  HPI    Review of Systems     Objective:   Physical Exam   [facility] reading (PRIMARY) by  Dr.Toriann Spadoni patient shows signs of previous granulomatous disease. Heart size is normal no infiltrates.       Assessment & Plan:

## 2012-04-22 NOTE — Progress Notes (Signed)
  Subjective:    Patient ID: [name], male    DOB: 01/30/1941, 71 y.o.   MRN: [id number]  HPI patient and complaining of increasing swelling and pain in both legs the he had Doppler studies done in February and these were within normal limits. Without evidence of a clot the he enters today because of increasing swelling he is unable to tolerate the proposed plan to get him on to he has been to the vascular surgeon Dr. Vear Clock for evaluation. He continues to complain of increasing swelling and discomfort    Review of Systems he specifically denies chest pain shortness of breath or other cardiac symptoms the     Objective:   Physical Exam physical exam reveals significant varicose veins of both legs. There is significant swelling from the knees down with stasis changes and ulceration on the right leg        Assessment & Plan:  Patient has had progressive increase in swelling in his legs despite high-dose Lasix the his last creatinine that was done was slightly elevated and this will need to be rechecked prior to increasing his diuretics.

## 2012-04-23 NOTE — Progress Notes (Signed)
Patient here for testosterone injection only. No provider patient encounter occurred.

## 2012-05-10 ENCOUNTER — Ambulatory Visit (INDEPENDENT_AMBULATORY_CARE_PROVIDER_SITE_OTHER): Payer: Medicare Other | Admitting: Physician Assistant

## 2012-05-10 DIAGNOSIS — E291 Testicular hypofunction: Secondary | ICD-10-CM

## 2012-05-10 MED ORDER — TESTOSTERONE CYPIONATE 200 MG/ML IM SOLN: 300.0000 mg | Freq: Once | INTRAMUSCULAR | Status: DC

## 2012-05-10 NOTE — Progress Notes (Signed)
   Patient ID: ELFORD EVILSIZER MRN: [id number], DOB: 09/18/1941, 71 y.o. Date of Encounter: 05/10/2012, 6:04 PM  Primary Physician: Lucilla Edin, MD  Chief Complaint: Here for testosterone injection  71 y.o. year old male here for testosterone injection. Last injection 03/16/12.  Last PSA 3.4. Ok to give testosterone injection. Will need office visit prior to next injection to check PSA/DRE. This was a nursing only encounter. No provider/patient encounter occurred today.    Signed, Eula Listen, PA-C 05/10/2012 6:04 PM

## 2012-05-19 ENCOUNTER — Ambulatory Visit (INDEPENDENT_AMBULATORY_CARE_PROVIDER_SITE_OTHER): Payer: Medicare Other | Admitting: Emergency Medicine

## 2012-05-19 VITALS — BP 106/68 | HR 98 | Temp 98.1°F | Resp 18 | Ht 68.5 in | Wt 231.2 lb

## 2012-05-19 DIAGNOSIS — S81009A Unspecified open wound, unspecified knee, initial encounter: Secondary | ICD-10-CM

## 2012-05-19 DIAGNOSIS — R609 Edema, unspecified: Secondary | ICD-10-CM | POA: Diagnosis not present

## 2012-05-19 DIAGNOSIS — R600 Localized edema: Secondary | ICD-10-CM

## 2012-05-19 DIAGNOSIS — S81801A Unspecified open wound, right lower leg, initial encounter: Secondary | ICD-10-CM

## 2012-05-19 DIAGNOSIS — S81809A Unspecified open wound, unspecified lower leg, initial encounter: Secondary | ICD-10-CM | POA: Diagnosis not present

## 2012-05-19 DIAGNOSIS — S91009A Unspecified open wound, unspecified ankle, initial encounter: Secondary | ICD-10-CM | POA: Diagnosis not present

## 2012-05-19 LAB — BASIC METABOLIC PANEL
CO2: 31 mEq/L (ref 19–32)
Calcium: 9.1 mg/dL (ref 8.4–10.5)
Glucose, Bld: 94 mg/dL (ref 70–99)
Potassium: 3.4 mEq/L — ABNORMAL LOW (ref 3.5–5.3)
Sodium: 138 mEq/L (ref 135–145)

## 2012-05-19 NOTE — Progress Notes (Signed)
  Subjective:    Patient ID: [name], male    DOB: Nov 04, 1941, 71 y.o.   MRN: [id number]  HPI patient in to recheck the swelling in both of his legs. He has had venous and arterial Dopplers he has significant varicose veins and significant swelling in lower extremities related to his venous insufficiency. He has been on increasing doses of diuretics and currently is on Lasix 40 twice a day and potassium twice a day as well as Zaroxolyn 5 mg every other day he states when he is up on his legs any period of time he has significant leg swelling. He has had significant fluctuations in his weight at home. The swelling in his legs is definitely dependent on the amount of time he is up on his feet . He also suffers from severe back pain and is due to see Dr. Epimenio Foot his neurologist tomorrow.    Review of Systems     Objective:   Physical Exam being 1 x 2 cm sore on the right shin. The patient has significant bilateral varicosities pulses are 2+ and symmetrical dorsalis pedis and posterior tibial. 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 He will stay on his increased dose of diuretics. I checked to be met today to check on the status of his renal function. A culture was done of the lesion on his right shin and he'll continue soap and water cleaning followed by Bactroban to the open sores. Patient will followup with the vascular surgeon next week and will followup with his neurologist tomorrow. A letter will be written describing his medical condition and difficulty he is having.

## 2012-05-20 DIAGNOSIS — IMO0002 Reserved for concepts with insufficient information to code with codable children: Secondary | ICD-10-CM | POA: Diagnosis not present

## 2012-05-20 DIAGNOSIS — M47817 Spondylosis without myelopathy or radiculopathy, lumbosacral region: Secondary | ICD-10-CM | POA: Diagnosis not present

## 2012-05-20 DIAGNOSIS — M7989 Other specified soft tissue disorders: Secondary | ICD-10-CM | POA: Diagnosis not present

## 2012-05-22 LAB — WOUND CULTURE: Gram Stain: NONE SEEN

## 2012-05-24 ENCOUNTER — Encounter: Payer: Self-pay | Admitting: Vascular Surgery

## 2012-05-24 DIAGNOSIS — M25569 Pain in unspecified knee: Secondary | ICD-10-CM | POA: Diagnosis not present

## 2012-05-24 DIAGNOSIS — IMO0002 Reserved for concepts with insufficient information to code with codable children: Secondary | ICD-10-CM | POA: Diagnosis not present

## 2012-05-24 DIAGNOSIS — M79609 Pain in unspecified limb: Secondary | ICD-10-CM | POA: Diagnosis not present

## 2012-05-25 ENCOUNTER — Ambulatory Visit (INDEPENDENT_AMBULATORY_CARE_PROVIDER_SITE_OTHER): Payer: Medicare Other | Admitting: Vascular Surgery

## 2012-05-25 ENCOUNTER — Encounter (INDEPENDENT_AMBULATORY_CARE_PROVIDER_SITE_OTHER): Payer: Medicare Other | Admitting: *Deleted

## 2012-05-25 ENCOUNTER — Other Ambulatory Visit: Payer: Self-pay | Admitting: *Deleted

## 2012-05-25 ENCOUNTER — Encounter: Payer: Self-pay | Admitting: Vascular Surgery

## 2012-05-25 VITALS — BP 141/76 | HR 94 | Resp 18 | Ht 71.0 in | Wt 233.2 lb

## 2012-05-25 DIAGNOSIS — I872 Venous insufficiency (chronic) (peripheral): Secondary | ICD-10-CM | POA: Diagnosis not present

## 2012-05-25 DIAGNOSIS — I83893 Varicose veins of bilateral lower extremities with other complications: Secondary | ICD-10-CM

## 2012-05-25 NOTE — Progress Notes (Signed)
Problems with Activities of Daily Living Secondary to Leg Pain  1. Mr. Henton works in [employer] and has long hours of prolonged standing and moving furniture which is difficult due to leg pain.  2. Mr. Lazaro states he has had to severely limit exercise due to leg pain and swelling.  3. Mr. Wilbert states he has difficulty doing yard work due to leg pain and swelling.  Rankin, Neena Rhymes   Failure of  Conservative Therapy:  1. Worn 20-30 mm Hg thigh high compression hose >3 months with no relief of symptoms.  2. Frequently elevates legs-no relief of symptoms  3. Taken Ibuprofen 600 Mg TID with no relief of symptoms.  The patient continues to have severe discomfort is unable to tolerate his compression garments due to increased pain. He does have ulceration over the pretibial area which is very slowly healing.  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 Reviewed his venous duplex with him. This does show reflux throughout the right great saphenous vein. I have recommended laser ablation for improvement of venous hypertension. He does have some swelling of the left leg and duplex shows some deep venous and superficial incompetence on the left. We will schedule this at his earliest convenience

## 2012-06-04 NOTE — Procedures (Unsigned)
LOWER EXTREMITY VENOUS REFLUX EXAM  INDICATION:  Left lower extremity swelling.  EXAM:  Using color-flow imaging and pulse Doppler spectral analysis, the left common femoral, femoral, popliteal, posterior tibial, great and small saphenous veins were evaluated.  There is evidence suggesting deep venous insufficiency in the left common femoral vein.  The left saphenofemoral junction is competent.  The left GSV is competent.  The left proximal small saphenous vein demonstrates competency.  GSV Diameter (used if found to be incompetent only)                                           Right    Left Proximal Greater Saphenous Vein           cm       cm Proximal-to-mid-thigh                     cm       cm Mid thigh                                 cm       cm Mid-distal thigh                          cm       cm Distal thigh                              cm       cm Knee                                      cm       cm  IMPRESSION: 1. Left great saphenous vein is competent. 2. The deep venous system is not competent with reflux of >500     milliseconds in the common femoral vein. 3. The left small saphenous vein is competent.  ___________________________________________ Larina Earthly, M.D.  LT/MEDQ  D:  05/25/2012  T:  05/25/2012  Job:  [id number]

## 2012-06-08 ENCOUNTER — Ambulatory Visit (INDEPENDENT_AMBULATORY_CARE_PROVIDER_SITE_OTHER): Payer: Medicare Other

## 2012-06-08 DIAGNOSIS — E291 Testicular hypofunction: Secondary | ICD-10-CM

## 2012-06-08 DIAGNOSIS — M1710 Unilateral primary osteoarthritis, unspecified knee: Secondary | ICD-10-CM | POA: Diagnosis not present

## 2012-06-08 DIAGNOSIS — M25569 Pain in unspecified knee: Secondary | ICD-10-CM | POA: Diagnosis not present

## 2012-06-28 ENCOUNTER — Other Ambulatory Visit: Payer: Self-pay | Admitting: Emergency Medicine

## 2012-06-28 ENCOUNTER — Ambulatory Visit (INDEPENDENT_AMBULATORY_CARE_PROVIDER_SITE_OTHER): Payer: Medicare Other

## 2012-06-28 DIAGNOSIS — E291 Testicular hypofunction: Secondary | ICD-10-CM | POA: Diagnosis not present

## 2012-07-28 ENCOUNTER — Ambulatory Visit (INDEPENDENT_AMBULATORY_CARE_PROVIDER_SITE_OTHER): Payer: Medicare Other | Admitting: Family Medicine

## 2012-07-28 ENCOUNTER — Telehealth: Payer: Self-pay | Admitting: Emergency Medicine

## 2012-07-28 ENCOUNTER — Other Ambulatory Visit: Payer: Self-pay | Admitting: Emergency Medicine

## 2012-07-28 ENCOUNTER — Ambulatory Visit: Payer: Medicare Other

## 2012-07-28 VITALS — BP 100/68 | HR 59 | Temp 98.3°F | Resp 18 | Ht 68.5 in | Wt 233.6 lb

## 2012-07-28 DIAGNOSIS — J029 Acute pharyngitis, unspecified: Secondary | ICD-10-CM

## 2012-07-28 DIAGNOSIS — R220 Localized swelling, mass and lump, head: Secondary | ICD-10-CM | POA: Diagnosis not present

## 2012-07-28 DIAGNOSIS — J309 Allergic rhinitis, unspecified: Secondary | ICD-10-CM

## 2012-07-28 DIAGNOSIS — E291 Testicular hypofunction: Secondary | ICD-10-CM

## 2012-07-28 DIAGNOSIS — R221 Localized swelling, mass and lump, neck: Secondary | ICD-10-CM

## 2012-07-28 LAB — POCT CBC
Granulocyte percent: 65.9 %G (ref 37–80)
HCT, POC: 47.6 % (ref 43.5–53.7)
Hemoglobin: 15.2 g/dL (ref 14.1–18.1)
Lymph, poc: 1.7 (ref 0.6–3.4)
MCH, POC: 32.1 pg — AB (ref 27–31.2)
MCV: 100.4 fL — AB (ref 80–97)
MID (cbc): 0.5 (ref 0–0.9)
MPV: 9.2 fL (ref 0–99.8)
POC Granulocyte: 4.2 (ref 2–6.9)
POC LYMPH PERCENT: 26.7 %L (ref 10–50)
RBC: 4.74 M/uL (ref 4.69–6.13)

## 2012-07-28 MED ORDER — TESTOSTERONE CYPIONATE 200 MG/ML IM SOLN
300.0000 mg | Freq: Once | INTRAMUSCULAR | Status: AC
  Administered 2012-07-28: 300 mg via INTRAMUSCULAR

## 2012-07-28 MED ORDER — FLUTICASONE PROPIONATE 50 MCG/ACT NA SUSP: 2.0000 | Freq: Every day | NASAL | Status: DC

## 2012-07-28 NOTE — Assessment & Plan Note (Signed)
Patient was given testosterone injection today.

## 2012-07-28 NOTE — Progress Notes (Signed)
  Subjective:    Patient ID: [name], male    DOB: 02-16-1941, 71 y.o.   MRN: [id number]  HPI 71 yo male here for anterior neck swelling. Patient states that he was cleaning out a storage unit with a lot of dusty furniture and mold on Sunday. Patient states within hours she started having some swelling of his anterior neck. Patient denies any shortness of breath, fevers, chills, states that he's had a little bit of trouble with swallowing. Patient has never had this before but had multiple infections of the tonsils previously. Patient denies any new medications, and any travel history, any recent sick contacts. Patient states that he feels like himself otherwise. Patient has been continuing to take his antihistamine which is a Zyrtec a regular basis. Patient decided to come in today because the swelling did not seem to be going down.   Review of Systems As stated above in history of present illness. Patient denies any pain just a fullness in his neck.    Objective:   Physical Exam Vitals reviewed General: No apparent distress alert and oriented x3 mood and affect normal patient able to speak but does sound congested. HEENT: Patient's pupils are equal round reactive to light and accommodation extraocular movements are intact, nares is patent the patient does have clear rhinorrhea and minor swelling of the turbinates bilaterally. Patient's throat exam does show that he does have a significant postnasal drip and some mild erythema of the posterior pharynx. Patient also has some erythema and very shallow ulcer of the uvula. On neck exam patient has significant swelling of the submandibular glands. This is so large that is unable to palpate patient's thyroid. They do not appear to be tender. Respiratory: Patient has clear to auscultation but does not have any stridor. No wheezes crackles or rales Cardiovascular: Regular rate and rhythm 1/6 systolic ejection murmur that is benign mostly the left  lower sternal border the Skin: Patient has no rash no erythema.   x-rays were ordered but patient declined. Patient states he wanted to discuss this with his primary care provider  Lab Results  Component Value Date   WBC 6.4 07/28/2012   HGB 15.2 07/28/2012   HCT 47.6 07/28/2012   MCV 100.4* 07/28/2012   PLT 144* 09/24/2009        Assessment & Plan:

## 2012-07-28 NOTE — Assessment & Plan Note (Addendum)
Patient has an acute swelling of the  submandibular glands. Likely this is not thyroid. Differential includes infectious versus allergic reaction. Patient is not taking any new medications and has no recent travel history. Patient has no perirectal or swelling or parotid gland swelling and no rash making mumps very unlikely. Patient's white blood cell count normal which makes infection less likely Patient declined imaging secondary to radiation. Discuss with patient at length stating that did want to see if he had any Epiglottis swelling. Patient warned of the potential traumatizing aspects of this would occur. At this time we will have patient Patient will followup in 24 hours and see either his primary care provider or Dr. Neva Seat for further evaluation. Warned of potential red flags as well as when to seek medical attention. Patient given information about salivary stones as well as salivary gland infections. Patient was offered prednisone which she declined as well today. Patient would take Flonase which I did refill for today to

## 2012-07-28 NOTE — Telephone Encounter (Signed)
Please call patient and be sure he follows up with me this week regarding his neck swelling

## 2012-07-28 NOTE — Progress Notes (Signed)
Reviewed and agree.

## 2012-07-28 NOTE — Patient Instructions (Addendum)
The white blood cell count is normal today. This does not appear to be infectious but still if he gets any high fevers, shortness of breath or trouble breathing please call 911 immediately. Please followup with Dr. Cleta Alberts tomorrow at your earliest convenience. I think he can suggest potentially starting you on either other antihistamines or prednisone to try to help with the swelling.    Salivary Gland Infection A salivary gland infection can be caused by a virus, bacteria from the mouth, or a stone. Mumps and other viruses may settle in one or more of the saliva glands. This will result in swelling, pain, and difficulty eating. Bacteria may cause a more severe infection in a salivary gland. A salivary stone blocking the flow of saliva can make this worse. These infections may be related to other medical problems. Some of these are dehydration, recent surgery, poor nutrition, and some medications. TREATMENT  Treatment of a salivary gland infection depends on the cause. Mumps and other virus infections do not require antibiotics. If bacteria cause the infection, then antibiotics are needed to get rid of the infection. If there is a salivary stone blocking the duct, minor surgery to remove the stone may be needed.  HOME CARE INSTRUCTIONS   Get plenty of rest, increase your fluids, and use warm compresses on the swollen area for 15 to 20 minutes 4 times per day or as often as feels good to you.   Suck on hard candy or chew sugarless gum to promote saliva production.   Only take over-the-counter or prescription medicines for pain, discomfort, or fever as directed by your caregiver.  SEEK IMMEDIATE MEDICAL CARE IF:   You have increased swelling or pain or pain not relieved with medications.   You develop chills or a fever.   Any of your problems are getting worse rather than better.  Document Released: 12/04/2004 Document Revised: 10/16/2011 Document Reviewed: 10/27/2005 [facility] Patient Information  2012 [place], [place].  Salivary Stone Your exam shows you have a stone in one of your saliva glands. These small stones form around a mucous plug in the ducts of the glands and cause the saliva in the gland to be blocked. This makes the gland swollen and painful, especially when you eat. If repeated episodes occur, the gland can become infected. Sometimes these stones can be seen on x-ray. Treatment includes stimulating the production of saliva to push the stone out. You should suck on a lemon or sour candies several times daily. Antibiotic medicine may be needed if the gland is infected. Increasing fluids, applying warm compresses to the swollen area 3-4 times daily, and massaging the gland from back to front may encourage drainage and passage of the stone. Surgical treatment to remove the stone is sometimes necessary, so proper medical follow up is very important. Call your doctor for an appointment as recommended. Call right away if you have a high fever, severe headache, vomiting, uncontrolled pain, or other serious symptoms. Document Released: 12/04/2004 Document Revised: 10/16/2011 Document Reviewed: 10/27/2005 [facility] Patient Information 2012 [place], [place].

## 2012-07-29 ENCOUNTER — Telehealth: Payer: Self-pay | Admitting: Emergency Medicine

## 2012-07-29 NOTE — Telephone Encounter (Signed)
Please be sure Mr. [name] was called to followup with me later in the week. Either tomorrow or Sat.

## 2012-07-29 NOTE — Telephone Encounter (Signed)
Left message for him to call me back about this.  

## 2012-07-29 NOTE — Progress Notes (Signed)
Patient discussed with Dr. Katrinka Blazing and patient also examined by me.  Agree with differential and recommendations per Dr. Michaelle Copas note, including close follow up.

## 2012-07-30 ENCOUNTER — Ambulatory Visit (INDEPENDENT_AMBULATORY_CARE_PROVIDER_SITE_OTHER): Payer: Medicare Other | Admitting: Emergency Medicine

## 2012-07-30 VITALS — BP 118/76 | HR 94 | Temp 97.8°F | Resp 16 | Ht 68.5 in | Wt 231.0 lb

## 2012-07-30 DIAGNOSIS — J029 Acute pharyngitis, unspecified: Secondary | ICD-10-CM

## 2012-07-30 DIAGNOSIS — K1120 Sialoadenitis, unspecified: Secondary | ICD-10-CM

## 2012-07-30 MED ORDER — VALACYCLOVIR HCL 1 G PO TABS: 1000.0000 mg | ORAL_TABLET | Freq: Two times a day (BID) | ORAL | Status: DC

## 2012-07-30 MED ORDER — FIRST-DUKES MOUTHWASH MT SUSP: OROMUCOSAL | Status: DC

## 2012-07-30 NOTE — Telephone Encounter (Signed)
I have left 2 messages with patient and he has not returned my calls yet, he is advised he needs to come in and he needs to call me also.

## 2012-07-30 NOTE — Progress Notes (Signed)
  Subjective:    Patient ID: [name], male    DOB: 12/12/1940, 71 y.o.   MRN: [id number]  HPI  Pt rtc for follow up, tonsils feeling swollen, has been exposed to heavy dust and mold, denies fever, chills, is feeling fatigued, ulcer on uvula,  Denies eating any hot food that could have contributed to ulcer,  Difficulty swallowing at times, sensitivity to heat/cold.  Submaxillary glands are swollen. Patient was noted to have prominent submandibular glands in the night but was not placed on treatment   Review of Systems     Objective:   Physical Exam patient is alert and cooperative and does not appear ill. His neck is supple. His chest is clear to examination of the uvula reveals a 1 by one half centimeter ulceration on the right lateral portion.        Assessment & Plan:  Patient here with a uvulitis. I do her routine culture for virus and also done a strep screen . He does have prominent submandibular glands so I feel an ENT evaluation is in order for that. I'm going to give him some 2 weeks for his sore throat and cover him without effect for his ulceration

## 2012-07-30 NOTE — Telephone Encounter (Signed)
Patient here now to be seen  

## 2012-07-30 NOTE — Telephone Encounter (Signed)
Called again about this. Left another message for him to call me back about this.

## 2012-08-02 DIAGNOSIS — J309 Allergic rhinitis, unspecified: Secondary | ICD-10-CM | POA: Diagnosis not present

## 2012-08-02 DIAGNOSIS — J342 Deviated nasal septum: Secondary | ICD-10-CM | POA: Diagnosis not present

## 2012-08-02 DIAGNOSIS — K219 Gastro-esophageal reflux disease without esophagitis: Secondary | ICD-10-CM | POA: Diagnosis not present

## 2012-08-02 DIAGNOSIS — R1310 Dysphagia, unspecified: Secondary | ICD-10-CM | POA: Diagnosis not present

## 2012-08-04 NOTE — Progress Notes (Signed)
This encounter was created in error - please disregard.

## 2012-08-09 ENCOUNTER — Telehealth: Payer: Self-pay

## 2012-08-09 DIAGNOSIS — I10 Essential (primary) hypertension: Secondary | ICD-10-CM

## 2012-08-09 MED ORDER — DOXAZOSIN MESYLATE 8 MG PO TABS: 8.0000 mg | ORAL_TABLET | Freq: Every day | ORAL | Status: DC

## 2012-08-09 NOTE — Telephone Encounter (Signed)
Pt had called about his Rx for doxazosin and stated he needs a 90 day supply instead of 30 day and is at pharmacy now. Called [facility] who stated that they had just faxed [place] a request for #90 of doxazosin and also RF of pt's Flurazepam which pt said Dr Cleta Alberts had originally written for him. I OK'd a 90 day RF of doxazosin, but will forward this message to Dr Cleta Alberts to review RF req for Flurazepam.  Pt's chart [id number] w/faxed req for Flurazepam is in Dr Ellis Parents box.

## 2012-08-10 NOTE — Telephone Encounter (Signed)
LMOM for pt to CB. We need to verify strength and dose of Flurazepam as pt has been taking it recently. [facility] has not filled it before and our records of the Rx are from 01/2011 and need to verify it is still 15 mg Qhs as needed.

## 2012-08-10 NOTE — Telephone Encounter (Signed)
It is fine to give him both medications with a 90 day supply with refills

## 2012-08-10 NOTE — Telephone Encounter (Signed)
Is fine to give him both medications with a 90 day supply with refills

## 2012-08-11 ENCOUNTER — Other Ambulatory Visit: Payer: Self-pay | Admitting: Radiology

## 2012-08-11 NOTE — Telephone Encounter (Signed)
LMOM to CB to verify strength

## 2012-08-12 MED ORDER — FLURAZEPAM HCL 15 MG PO CAPS: 15.0000 mg | ORAL_CAPSULE | Freq: Every evening | ORAL | Status: DC | PRN

## 2012-08-12 NOTE — Telephone Encounter (Signed)
It is okay for him to be on flurazepam 15 mg 1 each bedtime when necessary. He can have #30 with 5 refills. I do not want to send in 90 sleeping pills.

## 2012-08-12 NOTE — Telephone Encounter (Signed)
Pt stated he doesn't have to take the Flurazepam all the time but verified that he takes 15 mg and sig is take 1 at bedtime as needed. Dr Cleta Alberts is it OK to RF #90? Any add'l RFs?

## 2012-08-12 NOTE — Telephone Encounter (Signed)
Called Rx into [facility] and notified pt that it was done.

## 2012-08-19 ENCOUNTER — Telehealth: Payer: Self-pay

## 2012-08-19 NOTE — Telephone Encounter (Signed)
Left message, he should eliminate dairy products. 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 He is advised to call back if he has further questions.

## 2012-08-19 NOTE — Telephone Encounter (Signed)
DAUB - PT WOULD LIKE TO KNOW HOW CAN YOU TELL IF YOU ARE LACTOSE INTOLERANT AND IF HE IS, IS THERE ANYTHING HE CAN TAKE TO HELP?  [phone]

## 2012-09-03 ENCOUNTER — Ambulatory Visit (INDEPENDENT_AMBULATORY_CARE_PROVIDER_SITE_OTHER): Payer: Medicare Other | Admitting: *Deleted

## 2012-09-03 DIAGNOSIS — E236 Other disorders of pituitary gland: Secondary | ICD-10-CM | POA: Diagnosis not present

## 2012-09-03 DIAGNOSIS — E291 Testicular hypofunction: Secondary | ICD-10-CM

## 2012-09-03 MED ORDER — TESTOSTERONE CYPIONATE 200 MG/ML IM SOLN
300.0000 mg | Freq: Once | INTRAMUSCULAR | Status: AC
  Administered 2012-09-03: 300 mg via INTRAMUSCULAR

## 2012-09-28 ENCOUNTER — Ambulatory Visit (INDEPENDENT_AMBULATORY_CARE_PROVIDER_SITE_OTHER): Payer: Medicare Other | Admitting: Family Medicine

## 2012-09-28 ENCOUNTER — Other Ambulatory Visit: Payer: Self-pay | Admitting: Emergency Medicine

## 2012-09-28 VITALS — BP 151/87 | HR 78 | Temp 97.9°F | Resp 18 | Ht 68.5 in | Wt 243.0 lb

## 2012-09-28 DIAGNOSIS — Z23 Encounter for immunization: Secondary | ICD-10-CM

## 2012-09-28 DIAGNOSIS — E291 Testicular hypofunction: Secondary | ICD-10-CM

## 2012-09-28 MED ORDER — TESTOSTERONE CYPIONATE 100 MG/ML IM SOLN
300.0000 mg | Freq: Once | INTRAMUSCULAR | Status: AC
  Administered 2012-09-28: 300 mg via INTRAMUSCULAR

## 2012-09-28 NOTE — Progress Notes (Signed)
  Subjective:    Patient ID: [name], male    DOB: 1941/03/24, 71 y.o.   MRN: [id number]  HPI Patient here today for testosterone injection.    Review of Systems     Objective:   Physical Exam        Assessment & Plan:

## 2012-10-04 ENCOUNTER — Telehealth: Payer: Self-pay

## 2012-10-04 NOTE — Telephone Encounter (Signed)
Patient is calling about refill for hydrocodone and lorazepam. *(I saw the fax this morning it should be at the nurse's station in the refill request pile) He says it is the fourth time they have sent that request. He is wondering what the status on this is. He would like to pick this up from [facility] tonight if possible.  He also has a question for Dr Cleta Alberts: His dominant hand goes numb occasionally when writing or typing on the computer. No pain. Happens about 3 times a day and then goes back to normal. He is wondering why this happens and what he can do about it. I encouraged him to come in for a visit, but he says Dr. Cleta Alberts usually helps him out with these questions.  Best [phone]

## 2012-10-05 MED ORDER — LORAZEPAM 1 MG PO TABS: ORAL_TABLET | ORAL | Status: DC

## 2012-10-05 MED ORDER — HYDROCODONE-ACETAMINOPHEN 5-325 MG PO TABS
1.0000 | ORAL_TABLET | Freq: Three times a day (TID) | ORAL | Status: DC | PRN
Start: 2012-10-05 — End: 2012-11-16

## 2012-10-05 NOTE — Telephone Encounter (Signed)
Patient can have a refill on his hydrocodone and lorazepam. Regarding the numbness in his hand I would have to examine him to see the source of this.

## 2012-10-08 ENCOUNTER — Telehealth: Payer: Self-pay

## 2012-10-08 NOTE — Telephone Encounter (Signed)
PT PICKED UP HIS HYDROCODONE REFILL, BUT THE DOSAGE WAS WRONG.  IT WAS SUPPOSED TO BE 500 MG AND IT WAS ONLY 350 MG.  CAN WE PLEASE SEND IN THE CORRECT ONE?  IN REGARDS TO THE NUMBNESS IN HIS RIGHT ARM HE PLANS ON COMING IN TO SEE DR. DAUB ON Monday, DEC. 2.  [phone]

## 2012-10-08 NOTE — Telephone Encounter (Signed)
Tylenol dose was decreased, but amt of narcotic same, left message to advise.

## 2012-10-09 ENCOUNTER — Other Ambulatory Visit: Payer: Self-pay | Admitting: Family Medicine

## 2012-10-09 ENCOUNTER — Encounter: Payer: Self-pay | Admitting: Family Medicine

## 2012-10-09 DIAGNOSIS — M549 Dorsalgia, unspecified: Secondary | ICD-10-CM

## 2012-10-20 ENCOUNTER — Ambulatory Visit (INDEPENDENT_AMBULATORY_CARE_PROVIDER_SITE_OTHER): Payer: Medicare Other | Admitting: Emergency Medicine

## 2012-10-20 ENCOUNTER — Ambulatory Visit: Payer: Medicare Other

## 2012-10-20 VITALS — BP 130/80 | HR 87 | Temp 98.2°F | Resp 18 | Ht 69.0 in | Wt 244.0 lb

## 2012-10-20 DIAGNOSIS — E291 Testicular hypofunction: Secondary | ICD-10-CM

## 2012-10-20 DIAGNOSIS — R5381 Other malaise: Secondary | ICD-10-CM | POA: Diagnosis not present

## 2012-10-20 DIAGNOSIS — R5383 Other fatigue: Secondary | ICD-10-CM | POA: Diagnosis not present

## 2012-10-20 DIAGNOSIS — M25539 Pain in unspecified wrist: Secondary | ICD-10-CM | POA: Diagnosis not present

## 2012-10-20 DIAGNOSIS — Z125 Encounter for screening for malignant neoplasm of prostate: Secondary | ICD-10-CM

## 2012-10-20 DIAGNOSIS — R209 Unspecified disturbances of skin sensation: Secondary | ICD-10-CM

## 2012-10-20 DIAGNOSIS — R200 Anesthesia of skin: Secondary | ICD-10-CM

## 2012-10-20 DIAGNOSIS — G4730 Sleep apnea, unspecified: Secondary | ICD-10-CM

## 2012-10-20 LAB — BASIC METABOLIC PANEL
Glucose, Bld: 93 mg/dL (ref 70–99)
Potassium: 3.9 mEq/L (ref 3.5–5.3)
Sodium: 138 mEq/L (ref 135–145)

## 2012-10-20 LAB — POCT CBC
MCH, POC: 30.7 pg (ref 27–31.2)
MCV: 99.6 fL — AB (ref 80–97)
MID (cbc): 0.5 (ref 0–0.9)
POC LYMPH PERCENT: 22.5 %L (ref 10–50)
Platelet Count, POC: 197 10*3/uL (ref 142–424)
RBC: 4.56 M/uL — AB (ref 4.69–6.13)
WBC: 6.7 10*3/uL (ref 4.6–10.2)

## 2012-10-20 MED ORDER — TESTOSTERONE CYPIONATE 200 MG/ML IM SOLN
300.0000 mg | Freq: Once | INTRAMUSCULAR | Status: AC
  Administered 2012-10-20: 300 mg via INTRAMUSCULAR

## 2012-10-20 NOTE — Progress Notes (Signed)
  Subjective:    Patient ID: [name], male    DOB: Jun 02, 1941, 71 y.o.   MRN: [id number]  HPI presents today with tingling numbness in fingers R hand. This has been going on for at least 8 weeks. He is not sure whether it is related to the neck disease he has with radiculopathy down the right arm where there is another problem related to the numbness in his hand. Problem #2 is left knee pain. He's been to the orthopedist and recommended to have a toe and knee but is not interested in at the present time. He is currently wearing a brace. Problem #3 is low back pain. T  He sees Dr. Epimenio Foot in [place] and gets injections to help with his pain. 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 He is here to get his testosterone injection for this. He states his incredibly fatigued in the morning. He states he does not want to get out of bed. He's been having vivid dreams at night. He is known to have obstructive sleep apnea. He says significantly problems with swelling in his legs. He has bilateral varicose veins. He is unable to get on  support stockings.   Review of Systems     Objective:   Physical Exam HEENT is unremarkable neck supple his chest was clear cardiac regular rate no murmurs. Phalen's Tinel's Finkelstein and dorsiflexion testing of the right hand are normal. He is no focal weakness of the right hand.  [facility] reading (PRIMARY) by  Dr Cleta Alberts  severe degenerative change changes especially at the base of the first meta-carpal. No fractures        Assessment & Plan:  Glands schedule an EMG nerve conduction study to be sure this is carpal tunnel syndrome and not related to radiculopathy from his neck. Have set him up to see Dr. Gery Pray cardiologist to rule out a cardiomyopathy/congestive heart failure as the etiology of his daytime fatigue.  also made a referral to Dr. Vickey Huger  to check patient to be sure the CPAP settings he has are appropriate.

## 2012-10-21 LAB — TESTOSTERONE, FREE, TOTAL, SHBG: Sex Hormone Binding: 30 nmol/L (ref 13–71)

## 2012-10-25 ENCOUNTER — Telehealth: Payer: Self-pay

## 2012-10-25 NOTE — Telephone Encounter (Signed)
Sleep study faxed with confirmation to [facility].

## 2012-10-25 NOTE — Telephone Encounter (Signed)
KISSA FROM [facility] STATES THEY NEED THE SLEEP STUDY DONE ON PT PLEASE FAX TO [phone] AND THE PHONE NUMBER IS [phone]

## 2012-11-15 ENCOUNTER — Telehealth: Payer: Self-pay

## 2012-11-15 NOTE — Telephone Encounter (Signed)
[facility] CALLING TO REFILL HYDROCODONE. PLEASE CALL KATIE BACK.

## 2012-11-16 ENCOUNTER — Ambulatory Visit: Payer: Medicare Other | Admitting: Cardiovascular Disease

## 2012-11-16 ENCOUNTER — Telehealth: Payer: Self-pay | Admitting: Emergency Medicine

## 2012-11-16 MED ORDER — HYDROCODONE-ACETAMINOPHEN 5-325 MG PO TABS: 1.0000 | ORAL_TABLET | Freq: Three times a day (TID) | ORAL | Status: DC | PRN

## 2012-11-16 NOTE — Telephone Encounter (Signed)
Called in for him. 

## 2012-11-16 NOTE — Telephone Encounter (Signed)
Please call [facility] and give him 2 refills on his hydrocodone prescription

## 2012-11-16 NOTE — Telephone Encounter (Signed)
Per Amy, she called this into [facility] only with one refill.

## 2012-11-16 NOTE — Telephone Encounter (Signed)
Please check the record and be sure his hydrocodone prescription was called in to [facility].

## 2012-11-17 DIAGNOSIS — M47817 Spondylosis without myelopathy or radiculopathy, lumbosacral region: Secondary | ICD-10-CM | POA: Diagnosis not present

## 2012-11-17 DIAGNOSIS — M545 Low back pain, unspecified: Secondary | ICD-10-CM | POA: Diagnosis not present

## 2012-11-17 DIAGNOSIS — IMO0002 Reserved for concepts with insufficient information to code with codable children: Secondary | ICD-10-CM | POA: Diagnosis not present

## 2012-11-17 DIAGNOSIS — M4310 Spondylolisthesis, site unspecified: Secondary | ICD-10-CM | POA: Diagnosis not present

## 2012-11-17 DIAGNOSIS — M25569 Pain in unspecified knee: Secondary | ICD-10-CM | POA: Diagnosis not present

## 2012-11-18 ENCOUNTER — Ambulatory Visit (INDEPENDENT_AMBULATORY_CARE_PROVIDER_SITE_OTHER): Payer: Medicare Other

## 2012-11-18 DIAGNOSIS — E291 Testicular hypofunction: Secondary | ICD-10-CM

## 2012-11-18 MED ORDER — TESTOSTERONE CYPIONATE 100 MG/ML IM SOLN
350.0000 mg | Freq: Once | INTRAMUSCULAR | Status: AC
  Administered 2012-11-18: 350 mg via INTRAMUSCULAR

## 2012-11-22 ENCOUNTER — Telehealth: Payer: Self-pay | Admitting: *Deleted

## 2012-11-22 ENCOUNTER — Other Ambulatory Visit: Payer: Self-pay | Admitting: Family Medicine

## 2012-11-22 DIAGNOSIS — E291 Testicular hypofunction: Secondary | ICD-10-CM

## 2012-11-22 MED ORDER — TESTOSTERONE CYPIONATE 200 MG/ML IM SOLN: INTRAMUSCULAR | Status: DC

## 2012-11-22 NOTE — Telephone Encounter (Signed)
PHARMACY REQUESTING REFILL ON TESTOSTERONE INJECTION LAST FILL ON 06/28/12

## 2012-11-23 DIAGNOSIS — G5600 Carpal tunnel syndrome, unspecified upper limb: Secondary | ICD-10-CM | POA: Diagnosis not present

## 2012-11-23 DIAGNOSIS — G542 Cervical root disorders, not elsewhere classified: Secondary | ICD-10-CM | POA: Diagnosis not present

## 2012-12-10 DIAGNOSIS — IMO0002 Reserved for concepts with insufficient information to code with codable children: Secondary | ICD-10-CM | POA: Diagnosis not present

## 2012-12-10 DIAGNOSIS — M542 Cervicalgia: Secondary | ICD-10-CM | POA: Diagnosis not present

## 2012-12-10 DIAGNOSIS — M545 Low back pain, unspecified: Secondary | ICD-10-CM | POA: Diagnosis not present

## 2012-12-10 DIAGNOSIS — M5412 Radiculopathy, cervical region: Secondary | ICD-10-CM | POA: Diagnosis not present

## 2012-12-10 DIAGNOSIS — M47817 Spondylosis without myelopathy or radiculopathy, lumbosacral region: Secondary | ICD-10-CM | POA: Diagnosis not present

## 2012-12-14 ENCOUNTER — Ambulatory Visit (INDEPENDENT_AMBULATORY_CARE_PROVIDER_SITE_OTHER): Payer: Medicare Other | Admitting: Emergency Medicine

## 2012-12-14 VITALS — BP 114/73 | HR 90 | Temp 98.6°F | Resp 17 | Ht 69.0 in | Wt 234.0 lb

## 2012-12-14 DIAGNOSIS — E291 Testicular hypofunction: Secondary | ICD-10-CM

## 2012-12-14 DIAGNOSIS — R12 Heartburn: Secondary | ICD-10-CM

## 2012-12-14 DIAGNOSIS — R6881 Early satiety: Secondary | ICD-10-CM | POA: Diagnosis not present

## 2012-12-14 NOTE — Patient Instructions (Addendum)
Gastritis, Adult Gastritis is soreness and swelling (inflammation) of the lining of the stomach. Gastritis can develop as a sudden onset (acute) or long-term (chronic) condition. If gastritis is not treated, it can lead to stomach bleeding and ulcers. CAUSES  Gastritis occurs when the stomach lining is weak or damaged. Digestive juices from the stomach then inflame the weakened stomach lining. The stomach lining may be weak or damaged due to viral or bacterial infections. One common bacterial infection is the Helicobacter pylori infection. Gastritis can also result from excessive alcohol consumption, taking certain medicines, or having too much acid in the stomach.  SYMPTOMS  In some cases, there are no symptoms. When symptoms are present, they may include:  Pain or a burning sensation in the upper abdomen.  Nausea.  Vomiting.  An uncomfortable feeling of fullness after eating. DIAGNOSIS  Your caregiver may suspect you have gastritis based on your symptoms and a physical exam. To determine the cause of your gastritis, your caregiver may perform the following:  Blood or stool tests to check for the H pylori bacterium.  Gastroscopy. A thin, flexible tube (endoscope) is passed down the esophagus and into the stomach. The endoscope has a light and camera on the end. Your caregiver uses the endoscope to view the inside of the stomach.  Taking a tissue sample (biopsy) from the stomach to examine under a microscope. TREATMENT  Depending on the cause of your gastritis, medicines may be prescribed. If you have a bacterial infection, such as an H pylori infection, antibiotics may be given. If your gastritis is caused by too much acid in the stomach, H2 blockers or antacids may be given. Your caregiver may recommend that you stop taking aspirin, ibuprofen, or other nonsteroidal anti-inflammatory drugs (NSAIDs). HOME CARE INSTRUCTIONS  Only take over-the-counter or prescription medicines as directed by  your caregiver.  If you were given antibiotic medicines, take them as directed. Finish them even if you start to feel better.  Drink enough fluids to keep your urine clear or pale yellow.  Avoid foods and drinks that make your symptoms worse, such as:  Caffeine or alcoholic drinks.  Chocolate.  Peppermint or mint flavorings.  Garlic and onions.  Spicy foods.  Citrus fruits, such as oranges, lemons, or limes.  Tomato-based foods such as sauce, chili, salsa, and pizza.  Fried and fatty foods.  Eat small, frequent meals instead of large meals. SEEK IMMEDIATE MEDICAL CARE IF:   You have black or dark red stools.  You vomit blood or material that looks like coffee grounds.  You are unable to keep fluids down.  Your abdominal pain gets worse.  You have a fever.  You do not feel better after 1 week.  You have any other questions or concerns. MAKE SURE YOU:  Understand these instructions.  Will watch your condition.  Will get help right away if you are not doing well or get worse. Document Released: 10/21/2001 Document Revised: 04/27/2012 Document Reviewed: 12/10/2011 [employer] Patient Information 2013 [employer].  

## 2012-12-14 NOTE — Progress Notes (Signed)
[facility] [address] [place] [address] [phone]  Date:  12/14/2012   Name:  [name]   DOB:  Sep 21, 1941   MRN:  [id number]  PCP:  Lucilla Edin, MD    Chief Complaint: Hypogonadism and Abdominal Pain   History of Present Illness:  [name] is a 72 y.o. very pleasant male patient who presents with the following:  Has problems with early satiety.  History of long term use of H2 blocker, currently on prilosec.  Seems to be controlling his indigestion but feels badly after eating a full meal with fatigue and tiredness.  Denies any nausea or vomiting.  Preparing to travel to the [place] and is facing an 18 hour flight.  Patient Active Problem List  Diagnosis  . HYPERLIPIDEMIA  . ALLERGIC RHINITIS  . SLEEP APNEA  . Pain in limb  . Hypogonadism male  . Unspecified venous (peripheral) insufficiency  . Swelling in head/neck    Past Medical History  Diagnosis Date  . Arthritis   . 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 Sleep apnea   . 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    Past Surgical History  Procedure Date  . Epidural steroid injections     every 6 months   Dr. Despina Arias  . Hernia repair 2003  . Back surgery 2003    L4, L5    History  Substance Use Topics  . Smoking status: Never Smoker   . Smokeless tobacco: Never Used  . Alcohol Use: No    Family History  Problem Relation Age of Onset  . Thyroid disease Mother   . Heart disease Father     Allergies  Allergen Reactions  . Cephalexin     Medication list has been reviewed and updated.  Current Outpatient Prescriptions on File Prior to Visit  Medication Sig Dispense Refill  . aspirin 81 MG tablet Take 81 mg by mouth daily.      [name] buPROPion (WELLBUTRIN XL) 300 MG 24 hr tablet TAKE 1 TABLET BY MOUTH EVERY MORNING  90 tablet  0  . calcium carbonate 200 MG capsule Take 250 mg by mouth daily.      . cetirizine (ZYRTEC) 10 MG chewable  tablet Chew 10 mg by mouth daily.      [name] doxazosin (CARDURA) 8 MG tablet TAKE 1 TABLET BY MOUTH DAILY AT BEDTIME  90 tablet  0  . flurazepam (DALMANE) 15 MG capsule Take 1 capsule (15 mg total) by mouth at bedtime as needed for sleep.  30 capsule  5  . fluticasone (FLONASE) 50 MCG/ACT nasal spray Place 2 sprays into the nose daily.  16 g  6  . furosemide (LASIX) 40 MG tablet TAKE 1 TABLET BY MOUTH ONCE A DAY  90 tablet  3  . HYDROcodone-acetaminophen (NORCO/VICODIN) 5-325 MG per tablet Take 1 tablet by mouth every 8 (eight) hours as needed for pain.  90 tablet  1  . HYDROcodone-acetaminophen (VICODIN) 5-500 MG per tablet Take 1 tablet by mouth every 6 (six) hours as needed. Pt unsure of dosage.      [name] LORazepam (ATIVAN) 1 MG tablet Take one half tablet bid prn anxiety  90 tablet  0  . metolazone (ZAROXOLYN) 2.5 MG tablet Take 2.5 mg by mouth every other day.      . Multiple Vitamin (MULTIVITAMIN) tablet Take 1 tablet by mouth daily.      [name]  omeprazole (PRILOSEC) 40 MG capsule Take 40 mg by mouth daily.      . potassium chloride SA (K-DUR,KLOR-CON) 20 MEQ tablet Take 1 tablet (20 mEq total) by mouth 2 (two) times daily.  180 tablet  3  . testosterone cypionate (DEPOTESTOTERONE CYPIONATE) 200 MG/ML injection 1.5 ml q3 weeks  10 mL  1  . Diphenhyd-Hydrocort-Nystatin (FIRST-DUKES MOUTHWASH) SUSP 1 teaspoon his rinse gargle and spit 4 times a day  120 mL  1  . valACYclovir (VALTREX) 1000 MG tablet Take 1 tablet (1,000 mg total) by mouth 2 (two) times daily.  20 tablet  0   Current Facility-Administered Medications on File Prior to Visit  Medication Dose Route Frequency Provider Last Rate Last Dose  . testosterone cypionate (DEPOTESTOTERONE CYPIONATE) injection 300 mg  300 mg Intramuscular Once Sondra Barges, PA-C        Review of Systems:  As per HPI, otherwise negative.    Physical Examination: Filed Vitals:   12/14/12 1744  BP: 114/73  Pulse: 90  Temp: 98.6 F (37 C)  Resp: 17   Filed  Vitals:   12/14/12 1744  Height: 5\' 9"  (1.753 m)  Weight: 234 lb (106.142 kg)   Body mass index is 34.56 kg/(m^2). Ideal Body Weight: Weight in (lb) to have BMI = 25: 168.9    GEN: WDWN, NAD, Non-toxic, Alert & Oriented x 3 HEENT: Atraumatic, Normocephalic.  Ears and Nose: No external deformity. EXTR: No clubbing/cyanosis/edema NEURO: Normal gait.  PSYCH: Normally interactive. Conversant. Not depressed or anxious appearing.  Calm demeanor.    Assessment and Plan: Hypogonadism Early satiety H pylori Injection 350 mg testosterone Follow up with Dr Hulan Saas, Tessa Lerner, MD

## 2012-12-16 LAB — H. PYLORI ANTIBODY, IGG: H Pylori IgG: 0.46 {ISR}

## 2012-12-20 ENCOUNTER — Encounter: Payer: Self-pay | Admitting: Radiology

## 2012-12-20 ENCOUNTER — Telehealth: Payer: Self-pay | Admitting: Radiology

## 2012-12-20 NOTE — Telephone Encounter (Signed)
I spoke to patient to advise of negative H Pylori labs, he states he is improving some but still not well.

## 2013-01-07 ENCOUNTER — Encounter: Payer: Self-pay | Admitting: Physician Assistant

## 2013-01-07 ENCOUNTER — Ambulatory Visit (INDEPENDENT_AMBULATORY_CARE_PROVIDER_SITE_OTHER): Payer: Medicare Other | Admitting: Physician Assistant

## 2013-01-07 VITALS — BP 132/79 | HR 64 | Temp 98.1°F | Resp 16 | Ht 68.0 in | Wt 234.0 lb

## 2013-01-07 DIAGNOSIS — E291 Testicular hypofunction: Secondary | ICD-10-CM | POA: Diagnosis not present

## 2013-01-07 MED ORDER — TESTOSTERONE CYPIONATE 200 MG/ML IM SOLN
300.0000 mg | Freq: Once | INTRAMUSCULAR | Status: DC
  Administered 2013-01-07: 300 mg via INTRAMUSCULAR

## 2013-01-07 NOTE — Progress Notes (Signed)
Patient here for testosterone injection. He is receiving 350 mg q3weeks. Ok to give. Not seen by a provider today.

## 2013-01-15 ENCOUNTER — Other Ambulatory Visit: Payer: Self-pay | Admitting: Physician Assistant

## 2013-01-19 ENCOUNTER — Telehealth: Payer: Self-pay

## 2013-01-19 NOTE — Telephone Encounter (Signed)
[facility] requesting refill on Lorazepam 1mg .

## 2013-01-19 NOTE — Telephone Encounter (Signed)
Okay to refill the medication he can have 3 refills

## 2013-01-20 MED ORDER — LORAZEPAM 1 MG PO TABS: ORAL_TABLET | ORAL | Status: DC

## 2013-01-20 NOTE — Telephone Encounter (Signed)
Sent to [facility]  

## 2013-01-20 NOTE — Telephone Encounter (Signed)
Printed please sign so I can fax to [employer]

## 2013-01-25 ENCOUNTER — Telehealth: Payer: Self-pay | Admitting: Physician Assistant

## 2013-01-25 NOTE — Telephone Encounter (Signed)
Encounter opened in error

## 2013-01-26 ENCOUNTER — Telehealth: Payer: Self-pay | Admitting: Radiology

## 2013-01-26 ENCOUNTER — Ambulatory Visit (INDEPENDENT_AMBULATORY_CARE_PROVIDER_SITE_OTHER): Payer: Medicare Other | Admitting: Radiology

## 2013-01-26 DIAGNOSIS — E291 Testicular hypofunction: Secondary | ICD-10-CM | POA: Diagnosis not present

## 2013-01-26 MED ORDER — TESTOSTERONE CYPIONATE 200 MG/ML IM SOLN
2000.0000 mg | Freq: Once | INTRAMUSCULAR | Status: AC
  Administered 2013-01-26: 2000 mg via INTRAMUSCULAR

## 2013-01-26 NOTE — Telephone Encounter (Signed)
Patient presented to office asking for a letter from Dr Cleta Alberts he needs letter to be written for him so he can present this to Marvia Pickles, he is asking to expedite a fiance visa for his fiance who is from the [place]. He is expecting to have lumbar surgery by Dr Lovell Sheehan and needs her to care for him.[name] Unfortunately Dr Cleta Alberts will not be able to write this for him. His surgeon should be able to help by writing a letter explaining urgency of need for lumbar surgery. Documented in Chart per Dr Cleta Alberts. To you for chart review.

## 2013-01-27 ENCOUNTER — Telehealth: Payer: Self-pay | Admitting: *Deleted

## 2013-01-27 NOTE — Telephone Encounter (Signed)
Discussed with Mr. [name] Dr. Bosie Hanson instructions that he would not write the letter that Mr. [name] was requesting without being seen by Dr. Arbie Hanson first (Mr. [name]'s last visit with Dr. Arbie Hanson was on 05-25-2012.  Dr. Arbie Hanson needs a current assessment and needs to update Mr. [name]'s clinical record.  Mr. [name] states he is not happy with this outcome and stated that we were being insensitive to his needs.  I offered again to make a follow up appointment with Dr. Arbie Hanson and then pre-cert for the endovenous laser ablation procedure.  Mr. [name] declined the offer.

## 2013-01-27 NOTE — Telephone Encounter (Signed)
Mr Plemons came to the clinic today requesting to see [facility] &/or Dr Arbie Cookey. I went out and brought him into my office to talk with him. First I told him Dr Arbie Cookey and Lamar Laundry were both out of the office on Wed. Then asked what he needed. He showed me a letter Dr Arbie Cookey had written for him in July. 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 I told Mr Pringle that I would discuss this with Dr Arbie Cookey on Thursday and would be back in touch with him. I,also, told him he would probably have to be seen again since it had been so long ago.  01/27/2013: I discussed Mr Bierly's need with Dr Arbie Cookey.He said he would have to see him again and that our standard of care was that a patient would need to be driven home the day of the procedure; but, did not need a caregiver following the procedure. Dr Arbie Cookey said that typically his patients are off Thursday and Friday then have the weekend and go back to work on Monday. The day of the procedure we ask them to elevate leg and rest but after that there are no restrictions. I have informed Lamar Laundry of this discussion.

## 2013-02-08 ENCOUNTER — Telehealth: Payer: Self-pay

## 2013-02-08 DIAGNOSIS — R972 Elevated prostate specific antigen [PSA]: Secondary | ICD-10-CM | POA: Diagnosis not present

## 2013-02-08 DIAGNOSIS — E291 Testicular hypofunction: Secondary | ICD-10-CM | POA: Diagnosis not present

## 2013-02-08 MED ORDER — LORAZEPAM 1 MG PO TABS: ORAL_TABLET | ORAL | Status: DC

## 2013-02-08 NOTE — Telephone Encounter (Signed)
Received fax from [facility] that they did not ever receive the Rx we sent in for lorazepam 1 mg on 01/20/13 and need RF now. I called in Rx as written on 01/20/13 and will update EPIC record to correct date.

## 2013-02-10 ENCOUNTER — Encounter: Payer: Self-pay | Admitting: Emergency Medicine

## 2013-02-14 DIAGNOSIS — N401 Enlarged prostate with lower urinary tract symptoms: Secondary | ICD-10-CM | POA: Diagnosis not present

## 2013-02-14 DIAGNOSIS — N138 Other obstructive and reflux uropathy: Secondary | ICD-10-CM | POA: Diagnosis not present

## 2013-02-14 DIAGNOSIS — R972 Elevated prostate specific antigen [PSA]: Secondary | ICD-10-CM | POA: Diagnosis not present

## 2013-02-14 DIAGNOSIS — N529 Male erectile dysfunction, unspecified: Secondary | ICD-10-CM | POA: Diagnosis not present

## 2013-02-14 DIAGNOSIS — E291 Testicular hypofunction: Secondary | ICD-10-CM | POA: Diagnosis not present

## 2013-02-15 ENCOUNTER — Ambulatory Visit (INDEPENDENT_AMBULATORY_CARE_PROVIDER_SITE_OTHER): Payer: Medicare Other | Admitting: Physician Assistant

## 2013-02-15 DIAGNOSIS — E291 Testicular hypofunction: Secondary | ICD-10-CM | POA: Diagnosis not present

## 2013-02-15 MED ORDER — TESTOSTERONE CYPIONATE 200 MG/ML IM SOLN
350.0000 mg | Freq: Once | INTRAMUSCULAR | Status: AC
  Administered 2013-02-15: 350 mg via INTRAMUSCULAR

## 2013-02-15 NOTE — Progress Notes (Signed)
   Patient ID: [name] MRN: [id number], DOB: June 26, 1941, 72 y.o. Date of Encounter: 02/15/2013, 8:32 PM  Primary Physician: Lucilla Edin, MD  Chief Complaint: Here for testosterone injection  72 y.o. male here for testosterone injection. Last injection 01/26/13.  Last PSA was 10/2012. Ok to give testosterone injection. This was a nursing only encounter. No provider/patient encounter occurred today.    SignedEula Listen, PA-C 02/15/2013 8:32 PM

## 2013-02-21 ENCOUNTER — Ambulatory Visit (INDEPENDENT_AMBULATORY_CARE_PROVIDER_SITE_OTHER): Payer: Medicare Other | Admitting: Physician Assistant

## 2013-02-21 DIAGNOSIS — E291 Testicular hypofunction: Secondary | ICD-10-CM | POA: Diagnosis not present

## 2013-02-21 MED ORDER — TESTOSTERONE CYPIONATE 200 MG/ML IM SOLN
100.0000 mg | INTRAMUSCULAR | Status: DC
  Administered 2013-02-21 – 2014-08-01 (×20): 100 mg via INTRAMUSCULAR

## 2013-02-21 NOTE — Progress Notes (Signed)
  Subjective:    Patient ID: [name], male    DOB: 1941/05/25, 72 y.o.   MRN: [id number]  HPI Per urologist, the dose on his testosterone injection needs to be changed from 1.87mLs/3 weeks to 0.53mL weekly, Dr. Merla Riches said this is fine. His last PSA was 10/2012.   Review of Systems not done     Objective:   Physical Exam Not done     Assessment & Plan:  Ok to give 0.65mL today. Dose change on testosterone to 0.53mL weekly.

## 2013-02-24 ENCOUNTER — Encounter: Payer: Self-pay | Admitting: Cardiovascular Disease

## 2013-03-01 ENCOUNTER — Ambulatory Visit (INDEPENDENT_AMBULATORY_CARE_PROVIDER_SITE_OTHER): Payer: Medicare Other

## 2013-03-01 DIAGNOSIS — E291 Testicular hypofunction: Secondary | ICD-10-CM

## 2013-03-03 ENCOUNTER — Other Ambulatory Visit: Payer: Self-pay | Admitting: Neurosurgery

## 2013-03-03 DIAGNOSIS — M549 Dorsalgia, unspecified: Secondary | ICD-10-CM

## 2013-03-03 DIAGNOSIS — M5410 Radiculopathy, site unspecified: Secondary | ICD-10-CM

## 2013-03-07 ENCOUNTER — Ambulatory Visit (INDEPENDENT_AMBULATORY_CARE_PROVIDER_SITE_OTHER): Payer: Medicare Other | Admitting: Family Medicine

## 2013-03-07 ENCOUNTER — Ambulatory Visit
Admission: RE | Admit: 2013-03-07 | Discharge: 2013-03-07 | Disposition: A | Discharge status: Home / Self Care | Payer: Medicare Other | Source: Ambulatory Visit | Attending: Neurosurgery | Admitting: Neurosurgery

## 2013-03-07 DIAGNOSIS — M48061 Spinal stenosis, lumbar region without neurogenic claudication: Secondary | ICD-10-CM | POA: Diagnosis not present

## 2013-03-07 DIAGNOSIS — M47817 Spondylosis without myelopathy or radiculopathy, lumbosacral region: Secondary | ICD-10-CM | POA: Diagnosis not present

## 2013-03-07 DIAGNOSIS — E291 Testicular hypofunction: Secondary | ICD-10-CM

## 2013-03-07 DIAGNOSIS — M549 Dorsalgia, unspecified: Secondary | ICD-10-CM

## 2013-03-07 DIAGNOSIS — M5410 Radiculopathy, site unspecified: Secondary | ICD-10-CM

## 2013-03-07 MED ORDER — TESTOSTERONE CYPIONATE 200 MG/ML IM SOLN
100.0000 mg | INTRAMUSCULAR | Status: AC
  Administered 2013-03-07 – 2013-05-12 (×6): 100 mg via INTRAMUSCULAR

## 2013-03-07 NOTE — Progress Notes (Signed)
Pt was here for his weekly testosterone injection of 100mg  that was changed by his urologist on 4/14.  The orders were updated in the computer and the instructions were changed on the patient's medications to allow for less confusion in the future.  This was a nurse visit only.

## 2013-03-21 ENCOUNTER — Ambulatory Visit (INDEPENDENT_AMBULATORY_CARE_PROVIDER_SITE_OTHER): Payer: Medicare Other

## 2013-03-21 VITALS — BP 125/76 | HR 89 | Temp 97.8°F | Resp 16

## 2013-03-21 DIAGNOSIS — E291 Testicular hypofunction: Secondary | ICD-10-CM | POA: Diagnosis not present

## 2013-03-22 ENCOUNTER — Other Ambulatory Visit: Payer: Self-pay | Admitting: Neurosurgery

## 2013-03-22 DIAGNOSIS — M542 Cervicalgia: Secondary | ICD-10-CM | POA: Diagnosis not present

## 2013-03-22 DIAGNOSIS — IMO0002 Reserved for concepts with insufficient information to code with codable children: Secondary | ICD-10-CM | POA: Diagnosis not present

## 2013-03-24 ENCOUNTER — Telehealth: Payer: Self-pay | Admitting: *Deleted

## 2013-03-24 ENCOUNTER — Inpatient Hospital Stay: Admission: RE | Admit: 2013-03-24 | Payer: PRIVATE HEALTH INSURANCE | Source: Ambulatory Visit

## 2013-03-24 ENCOUNTER — Other Ambulatory Visit: Payer: Self-pay | Admitting: *Deleted

## 2013-03-24 MED ORDER — HYDROCODONE-ACETAMINOPHEN 5-325 MG PO TABS: 1.0000 | ORAL_TABLET | Freq: Three times a day (TID) | ORAL | Status: DC | PRN

## 2013-03-24 MED ORDER — LORAZEPAM 1 MG PO TABS: ORAL_TABLET | ORAL | Status: DC

## 2013-03-24 NOTE — Telephone Encounter (Signed)
Per Dr. Cleta Alberts 30 day supply ok with 3 refills. rx's faxed to [facility].

## 2013-03-24 NOTE — Telephone Encounter (Signed)
Pt would like his hydrocodone and lorazepam refilled for 90 days.  Last refill for hydrocodone was 3/7 and lorazepam was 4/1.

## 2013-03-28 ENCOUNTER — Ambulatory Visit (INDEPENDENT_AMBULATORY_CARE_PROVIDER_SITE_OTHER): Payer: Medicare Other | Admitting: Radiology

## 2013-03-28 VITALS — BP 132/72 | HR 76 | Temp 97.8°F | Resp 16 | Ht 69.5 in | Wt 240.0 lb

## 2013-03-28 DIAGNOSIS — E291 Testicular hypofunction: Secondary | ICD-10-CM | POA: Diagnosis not present

## 2013-04-01 ENCOUNTER — Ambulatory Visit
Admission: RE | Admit: 2013-04-01 | Discharge: 2013-04-01 | Disposition: A | Discharge status: Home / Self Care | Payer: Medicare Other | Source: Ambulatory Visit | Attending: Neurosurgery | Admitting: Neurosurgery

## 2013-04-01 DIAGNOSIS — M5030 Other cervical disc degeneration, unspecified cervical region: Secondary | ICD-10-CM | POA: Diagnosis not present

## 2013-04-01 DIAGNOSIS — M542 Cervicalgia: Secondary | ICD-10-CM

## 2013-04-01 DIAGNOSIS — M4802 Spinal stenosis, cervical region: Secondary | ICD-10-CM | POA: Diagnosis not present

## 2013-04-01 DIAGNOSIS — M47812 Spondylosis without myelopathy or radiculopathy, cervical region: Secondary | ICD-10-CM | POA: Diagnosis not present

## 2013-04-11 ENCOUNTER — Ambulatory Visit (INDEPENDENT_AMBULATORY_CARE_PROVIDER_SITE_OTHER): Payer: Medicare Other | Admitting: Family Medicine

## 2013-04-11 ENCOUNTER — Other Ambulatory Visit: Payer: Self-pay | Admitting: Emergency Medicine

## 2013-04-11 DIAGNOSIS — E291 Testicular hypofunction: Secondary | ICD-10-CM | POA: Diagnosis not present

## 2013-04-18 ENCOUNTER — Ambulatory Visit (INDEPENDENT_AMBULATORY_CARE_PROVIDER_SITE_OTHER): Payer: Medicare Other | Admitting: Radiology

## 2013-04-18 DIAGNOSIS — E291 Testicular hypofunction: Secondary | ICD-10-CM | POA: Diagnosis not present

## 2013-04-18 MED ORDER — TESTOSTERONE CYPIONATE 100 MG/ML IM SOLN
200.0000 mg | Freq: Once | INTRAMUSCULAR | Status: AC
  Administered 2013-04-18: 200 mg via INTRAMUSCULAR

## 2013-04-25 ENCOUNTER — Ambulatory Visit (INDEPENDENT_AMBULATORY_CARE_PROVIDER_SITE_OTHER): Payer: Medicare Other | Admitting: Internal Medicine

## 2013-04-25 VITALS — BP 130/78 | HR 98 | Temp 98.3°F | Resp 18 | Ht 69.0 in | Wt 235.0 lb

## 2013-04-25 DIAGNOSIS — Z23 Encounter for immunization: Secondary | ICD-10-CM

## 2013-04-25 DIAGNOSIS — K219 Gastro-esophageal reflux disease without esophagitis: Secondary | ICD-10-CM | POA: Diagnosis not present

## 2013-04-25 DIAGNOSIS — N429 Disorder of prostate, unspecified: Secondary | ICD-10-CM

## 2013-04-25 DIAGNOSIS — R5383 Other fatigue: Secondary | ICD-10-CM

## 2013-04-25 DIAGNOSIS — R5381 Other malaise: Secondary | ICD-10-CM | POA: Diagnosis not present

## 2013-04-25 DIAGNOSIS — E291 Testicular hypofunction: Secondary | ICD-10-CM | POA: Diagnosis not present

## 2013-04-25 DIAGNOSIS — E785 Hyperlipidemia, unspecified: Secondary | ICD-10-CM

## 2013-04-25 MED ORDER — METOLAZONE 2.5 MG PO TABS: 2.5000 mg | ORAL_TABLET | Freq: Every day | ORAL | Status: DC

## 2013-04-25 MED ORDER — FUROSEMIDE 40 MG PO TABS: ORAL_TABLET | ORAL | Status: DC

## 2013-04-25 MED ORDER — POTASSIUM CHLORIDE CRYS ER 20 MEQ PO TBCR: 20.0000 meq | EXTENDED_RELEASE_TABLET | Freq: Two times a day (BID) | ORAL | Status: DC

## 2013-04-25 NOTE — Progress Notes (Signed)
Subjective:    Patient ID: [name], male    DOB: 1941-07-28, 72 y.o.   MRN: [id number]   HPI Patients presents today and would like to get his testetrone shot. He also would like to check and see when he had a Tdap booster as his daughter is expecting a baby. He also would like a refill on pottassium, Lasix, and Metolazone. Patient also has some questions about prilosec- he is concerned because he read the precautions which had a warning against taking the medication for more than two years. He also states that he has been burping burping a lot and has been lethargic after eating his evening meal and this has been happening a couple of months. If he eats regular portions he will get increased reflux at night with certain foods .Patient states that he switched to the Prilosec a couple of years ago, this has generally been helpful. Patient also states that he has had difficulty getting up in the morning. He is on CPAP and gets 8 hours of interrupted sleep. He has good energy during the middle part of the day, and is often able to stay up late. He mentions that he has recently changed is administration time on Wellbutrin from evening to in the morning.    see History of change in testosterone administration to weekly to see if it changed the effectiveness  Daughter expecting baby and so he requests pertussis vaccine  Patient Active Problem List   Diagnosis Date Noted  . Swelling in head/neck 07/28/2012  . 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 Pain in limb 02/12/2012  . HYPERLIPIDEMIA 02/28/2008  . ALLERGIC RHINITIS 02/28/2008  . SLEEP APNEA 02/28/2008   Current outpatient prescriptions:aspirin 81 MG tablet, Take 81 mg by mouth daily., Disp: , Rfl: ;  buPROPion (WELLBUTRIN XL) 300 MG 24 hr tablet, TAKE 1 TABLET BY MOUTH EVERY MORNING, Disp: 90 tablet, Rfl: 0;  calcium carbonate 200 MG capsule, Take 250 mg by mouth daily., Disp: , Rfl: ;   cetirizine (ZYRTEC) 10 MG chewable tablet, Chew 10 mg by mouth daily., Disp: , Rfl:  doxazosin (CARDURA) 8 MG tablet, TAKE 1 TABLET BY MOUTH DAILY AT BEDTIME, Disp: 90 tablet, Rfl: 0;  flurazepam (DALMANE) 15 MG capsule, Take 1 capsule (15 mg total) by mouth at bedtime as needed for sleep., Disp: 30 capsule, Rfl: 5;  fluticasone (FLONASE) 50 MCG/ACT nasal spray, Place 2 sprays into the nose daily., Disp: 16 g, Rfl: 6;  furosemide (LASIX) 40 MG tablet, TAKE 1 TABLET BY MOUTH ONCE A DAY, Disp: 90 tablet, Rfl: 3 HYDROcodone-acetaminophen (NORCO/VICODIN) 5-325 MG per tablet, Take 1 tablet by mouth every 8 (eight) hours as needed for pain., Disp: 90 tablet, Rfl: 3;  LORazepam (ATIVAN) 1 MG tablet, Take one tablet bid prn anxiety, Disp: 90 tablet, Rfl: 3;  metolazone (ZAROXOLYN) 2.5 MG tablet, Take 1 tablet (2.5 mg total) by mouth daily., Disp: 90 tablet, Rfl: 3;  Multiple Vitamin (MULTIVITAMIN) tablet, Take 1 tablet by mouth daily., Disp: , Rfl:  omeprazole (PRILOSEC) 40 MG capsule, Take 40 mg by mouth daily., Disp: , Rfl: ;  potassium chloride SA (K-DUR,KLOR-CON) 20 MEQ tablet, Take 1 tablet (20 mEq total) by mouth 2 (two) times daily., Disp: 180 tablet, Rfl: 3;  Diphenhyd-Hydrocort-Nystatin (FIRST-DUKES MOUTHWASH) SUSP, 1 teaspoon his rinse gargle and spit 4 times a day, Disp: 120 mL, Rfl: 1 HYDROcodone-acetaminophen (VICODIN) 5-500 MG per tablet, Take 1 tablet by mouth every 6 (six)  hours as needed. Pt unsure of dosage., Disp: , Rfl: ;  valACYclovir (VALTREX) 1000 MG tablet, Take 1 tablet (1,000 mg total) by mouth 2 (two) times daily., Disp: 20 tablet, Rfl: 0 Current facility-administered medications:testosterone cypionate (DEPOTESTOTERONE CYPIONATE) injection 100 mg, 100 mg, Intramuscular, Weekly, Anders Simmonds, PA-C, 100 mg at 03/01/13 1945;  testosterone cypionate (DEPOTESTOTERONE CYPIONATE) injection 100 mg, 100 mg, Intramuscular, Weekly, Morrell Riddle, PA-C, 100 mg at 04/11/13 1951    Review of  Systems  Constitutional: Negative for fever and appetite change.  HENT: Negative for neck pain.   Eyes: Negative.   Respiratory: Negative for shortness of breath.   Cardiovascular: Positive for leg swelling. Negative for chest pain and palpitations.  Gastrointestinal: Positive for abdominal pain (describes it as burning and irritating after eating, that disipates after an hour) and constipation. Negative for nausea, vomiting and diarrhea.  Genitourinary: Negative for frequency.  Allergic/Immunologic: Negative.   Hematological: Negative.   Psychiatric/Behavioral: Negative.    he has a lot of general symptoms of aches and pains without specific swelling or redness     Objective:   Physical Exam  Vitals reviewed. Constitutional: He appears well-developed and well-nourished.  HENT:  Head: Normocephalic and atraumatic.  Eyes: Conjunctivae are normal.  Neck: Normal range of motion.  Cardiovascular: Normal rate, regular rhythm and normal heart sounds.   Pulmonary/Chest: Effort normal.  Abdominal: Soft. Bowel sounds are normal. He exhibits no distension and no mass. There is no tenderness. There is no rebound and no guarding.  Musculoskeletal: Normal range of motion.          Assessment & Plan:  Assessment-  Other malaise and fatigue - Plan: Comprehensive metabolic panel, CBC with Differential, TSH, t4  Esophageal reflux - Plan: CBC with Differential/?endoscopy/R/O GB disorder  Hypogonadism male - Plan:, Free,total Testosterone, PSA  Need for Tdap vaccination - Plan: Tdap vaccine greater than or equal to 7yo IM  Other and unspecified hyperlipidemia - Plan: Lipid Panel  Edema-probably secondary to venous insufficiency -meds refilled  Mild renal insufficiency  Disorder of prostate must be ruled out because of chronic testosterone therapy  - Plan: PSA    Education provided to switch Wellbutrin back to the AM dose Refilled meds  Meds ordered this encounter  Medications  .  furosemide (LASIX) 40 MG tablet    Sig: TAKE 1 TABLET BY MOUTH ONCE A DAY    Dispense:  90 tablet    Refill:  3  . potassium chloride SA (K-DUR,KLOR-CON) 20 MEQ tablet    Sig: Take 1 tablet (20 mEq total) by mouth 2 (two) times daily.    Dispense:  180 tablet    Refill:  3  . metolazone (ZAROXOLYN) 2.5 MG tablet    Sig: Take 1 tablet (2.5 mg total) by mouth daily.    Dispense:  90 tablet    Refill:  3   Weekly testosterone given 100 mg  CBC with diff, PSA, CMP, Lipid profile, Testosterone, TSH, free t4

## 2013-04-26 LAB — CBC WITH DIFFERENTIAL/PLATELET
Basophils Absolute: 0 10*3/uL (ref 0.0–0.1)
Basophils Relative: 0 % (ref 0–1)
Eosinophils Absolute: 0.2 10*3/uL (ref 0.0–0.7)
Eosinophils Relative: 2 % (ref 0–5)
HCT: 47.6 % (ref 39.0–52.0)
Lymphocytes Relative: 20 % (ref 12–46)
MCH: 31.1 pg (ref 26.0–34.0)
MCHC: 35.7 g/dL (ref 30.0–36.0)
MCV: 87.2 fL (ref 78.0–100.0)
Monocytes Absolute: 0.6 10*3/uL (ref 0.1–1.0)
Platelets: 214 10*3/uL (ref 150–400)
RDW: 13.4 % (ref 11.5–15.5)
WBC: 9.5 10*3/uL (ref 4.0–10.5)

## 2013-04-26 LAB — COMPREHENSIVE METABOLIC PANEL
ALT: 33 U/L (ref 0–53)
Albumin: 4.5 g/dL (ref 3.5–5.2)
Alkaline Phosphatase: 82 U/L (ref 39–117)
Glucose, Bld: 98 mg/dL (ref 70–99)
Potassium: 2.8 mEq/L — ABNORMAL LOW (ref 3.5–5.3)
Sodium: 136 mEq/L (ref 135–145)
Total Bilirubin: 0.6 mg/dL (ref 0.3–1.2)
Total Protein: 7.7 g/dL (ref 6.0–8.3)

## 2013-04-26 LAB — LIPID PANEL
HDL: 42 mg/dL (ref >39)
LDL Cholesterol: 108 mg/dL — ABNORMAL HIGH (ref 0–99)

## 2013-04-26 LAB — TESTOSTERONE: Testosterone: 620 ng/dL (ref 300–890)

## 2013-04-26 LAB — TSH: TSH: 2.031 u[IU]/mL (ref 0.350–4.500)

## 2013-05-02 ENCOUNTER — Other Ambulatory Visit: Payer: Self-pay | Admitting: Physician Assistant

## 2013-05-03 ENCOUNTER — Ambulatory Visit (INDEPENDENT_AMBULATORY_CARE_PROVIDER_SITE_OTHER): Payer: Medicare Other

## 2013-05-03 DIAGNOSIS — E871 Hypo-osmolality and hyponatremia: Secondary | ICD-10-CM | POA: Diagnosis not present

## 2013-05-04 LAB — BASIC METABOLIC PANEL
BUN: 20 mg/dL (ref 6–23)
Creat: 1.73 mg/dL — ABNORMAL HIGH (ref 0.50–1.35)

## 2013-05-12 ENCOUNTER — Ambulatory Visit (INDEPENDENT_AMBULATORY_CARE_PROVIDER_SITE_OTHER): Payer: Medicare Other | Admitting: Family Medicine

## 2013-05-12 DIAGNOSIS — E291 Testicular hypofunction: Secondary | ICD-10-CM

## 2013-05-19 ENCOUNTER — Ambulatory Visit (INDEPENDENT_AMBULATORY_CARE_PROVIDER_SITE_OTHER): Payer: Medicare Other | Admitting: Radiology

## 2013-05-19 DIAGNOSIS — E291 Testicular hypofunction: Secondary | ICD-10-CM | POA: Diagnosis not present

## 2013-05-19 MED ORDER — TESTOSTERONE CYPIONATE 200 MG/ML IM SOLN
100.0000 mg | Freq: Once | INTRAMUSCULAR | Status: AC
  Administered 2013-05-19: 100 mg via INTRAMUSCULAR

## 2013-05-24 ENCOUNTER — Ambulatory Visit (INDEPENDENT_AMBULATORY_CARE_PROVIDER_SITE_OTHER): Payer: Medicare Other | Admitting: *Deleted

## 2013-05-24 DIAGNOSIS — Q828 Other specified congenital malformations of skin: Secondary | ICD-10-CM

## 2013-05-24 DIAGNOSIS — E291 Testicular hypofunction: Secondary | ICD-10-CM

## 2013-05-24 MED ORDER — TESTOSTERONE CYPIONATE 200 MG/ML IM SOLN
100.0000 mg | Freq: Once | INTRAMUSCULAR | Status: AC
  Administered 2013-05-24: 100 mg via INTRAMUSCULAR

## 2013-06-01 ENCOUNTER — Ambulatory Visit (INDEPENDENT_AMBULATORY_CARE_PROVIDER_SITE_OTHER): Payer: Medicare Other | Admitting: *Deleted

## 2013-06-01 DIAGNOSIS — E291 Testicular hypofunction: Secondary | ICD-10-CM

## 2013-06-06 ENCOUNTER — Encounter: Payer: Self-pay | Admitting: Internal Medicine

## 2013-06-13 ENCOUNTER — Ambulatory Visit (INDEPENDENT_AMBULATORY_CARE_PROVIDER_SITE_OTHER): Payer: Medicare Other | Admitting: *Deleted

## 2013-06-13 DIAGNOSIS — E291 Testicular hypofunction: Secondary | ICD-10-CM

## 2013-06-26 ENCOUNTER — Ambulatory Visit (INDEPENDENT_AMBULATORY_CARE_PROVIDER_SITE_OTHER): Payer: Medicare Other

## 2013-06-26 DIAGNOSIS — E291 Testicular hypofunction: Secondary | ICD-10-CM

## 2013-07-05 ENCOUNTER — Ambulatory Visit (INDEPENDENT_AMBULATORY_CARE_PROVIDER_SITE_OTHER): Payer: Medicare Other | Admitting: *Deleted

## 2013-07-05 DIAGNOSIS — E291 Testicular hypofunction: Secondary | ICD-10-CM | POA: Diagnosis not present

## 2013-07-05 MED ORDER — TESTOSTERONE CYPIONATE 200 MG/ML IM SOLN
100.0000 mg | Freq: Once | INTRAMUSCULAR | Status: AC
  Administered 2013-07-05: 100 mg via INTRAMUSCULAR

## 2013-07-14 ENCOUNTER — Ambulatory Visit (INDEPENDENT_AMBULATORY_CARE_PROVIDER_SITE_OTHER): Payer: Medicare Other | Admitting: Family Medicine

## 2013-07-14 DIAGNOSIS — E291 Testicular hypofunction: Secondary | ICD-10-CM | POA: Diagnosis not present

## 2013-07-25 ENCOUNTER — Ambulatory Visit (INDEPENDENT_AMBULATORY_CARE_PROVIDER_SITE_OTHER): Payer: Medicare Other | Admitting: Radiology

## 2013-07-25 DIAGNOSIS — E291 Testicular hypofunction: Secondary | ICD-10-CM

## 2013-08-03 ENCOUNTER — Ambulatory Visit (INDEPENDENT_AMBULATORY_CARE_PROVIDER_SITE_OTHER): Payer: Medicare Other | Admitting: Family Medicine

## 2013-08-03 VITALS — BP 142/82 | HR 80 | Temp 98.2°F | Resp 18 | Ht 69.0 in | Wt 237.0 lb

## 2013-08-03 DIAGNOSIS — N189 Chronic kidney disease, unspecified: Secondary | ICD-10-CM

## 2013-08-03 DIAGNOSIS — Z79899 Other long term (current) drug therapy: Secondary | ICD-10-CM

## 2013-08-03 DIAGNOSIS — R972 Elevated prostate specific antigen [PSA]: Secondary | ICD-10-CM | POA: Diagnosis not present

## 2013-08-03 DIAGNOSIS — L988 Other specified disorders of the skin and subcutaneous tissue: Secondary | ICD-10-CM

## 2013-08-03 DIAGNOSIS — R42 Dizziness and giddiness: Secondary | ICD-10-CM

## 2013-08-03 DIAGNOSIS — E291 Testicular hypofunction: Secondary | ICD-10-CM

## 2013-08-03 DIAGNOSIS — Z23 Encounter for immunization: Secondary | ICD-10-CM

## 2013-08-03 DIAGNOSIS — R7301 Impaired fasting glucose: Secondary | ICD-10-CM

## 2013-08-03 LAB — POCT GLYCOSYLATED HEMOGLOBIN (HGB A1C): Hemoglobin A1C: 5.6

## 2013-08-03 LAB — POCT URINALYSIS DIPSTICK
Bilirubin, UA: NEGATIVE
Blood, UA: NEGATIVE
Ketones, UA: NEGATIVE
Leukocytes, UA: NEGATIVE

## 2013-08-03 NOTE — Progress Notes (Signed)
Subjective:    Patient ID: [name], male    DOB: 1940-12-21, 72 y.o.   MRN: [id number] Chief Complaint  Patient presents with  . mole on back getting larger  . neck swelling    comes and goes hard to swollow and breath   . dizziness after eating sweets  . burping early morning    on going   . testosterone injection    HPI   Has episodes of getting very dizzy and hot.  Happening after he eats and is actually have some symptoms now - started about 10-15 minutes ago intensely as he has eaten about 45 min ago.  However, still feels very hot and head feels dizzyHappens more time than not but if he eats a little at a time. If he eats any amount will get really tired so not just carbs   Gets fuzzy right in his head and will take 1/2 an asa at times because he is so worried.  Will occ get arm pain but never coincides with the dizziness.  Dizzy sxs after eating definitely getting worse over the past 2 yrs  Has chronic back pain - needs to have a low lumbar fusion but doesn't have time. 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 Has a lot of chronic swelling in his legs from vascular insufficiency.  Can't lay flat due to neck pain.  Has a mole on his back that has been itching and getting bigger - was told to have evaluated.   Sees Dr. Annabell Howells for his hypogonadism management and rx - was getting every 3 wks but not adequate so changed to qwk but has only been able to come in every 9-10d. He has an appt w/ Dr. Annabell Howells in 2-3 wks and would like his hypogonadism.  Gets his injections here rather than at Dr. Belva Crome office due to appts and need for co-pays.  Mother and daughter both have parathyroid problems, father with CAD, also thyroid abnormality in family.  Has a lot of mold in his house - wonders if this could be a cause of his pain.  Several wks ago had some swelling in his neck when he was really stressed out - seemed to be a little hard to swallow.  Now resolved.  Wonders  if he is at risk of osteopenia/porosis from 7-8 yrs of epidural steroid injections.  Would like flu shot today.  Switched from glucosamine-chrondroiton to glucosamine bid recently.  Will see if it causes any difference in pain.  Past Medical History  Diagnosis Date  . Arthritis   . 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 Sleep apnea   . 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   Current Outpatient Prescriptions on File Prior to Visit  Medication Sig Dispense Refill  . aspirin 81 MG tablet Take 81 mg by mouth daily.      [name] buPROPion (WELLBUTRIN XL) 300 MG 24 hr tablet TAKE 1 TABLET BY MOUTH EVERY MORNING  90 tablet  1  . calcium carbonate 200 MG capsule Take 250 mg by mouth daily.      . cetirizine (ZYRTEC) 10 MG chewable tablet Chew 10 mg by mouth daily.      . Diphenhyd-Hydrocort-Nystatin (FIRST-DUKES MOUTHWASH) SUSP 1 teaspoon his rinse gargle and spit 4 times a day  120 mL  1  . doxazosin (CARDURA) 8 MG tablet TAKE 1 TABLET BY MOUTH ATBEDTIME  90 tablet  1  . flurazepam (DALMANE) 15 MG capsule Take 1 capsule (15 mg total) by mouth at bedtime as needed for sleep.  30 capsule  5  . fluticasone (FLONASE) 50 MCG/ACT nasal spray Place 2 sprays into the nose daily.  16 g  6  . furosemide (LASIX) 40 MG tablet TAKE 1 TABLET BY MOUTH ONCE A DAY  90 tablet  3  . HYDROcodone-acetaminophen (NORCO/VICODIN) 5-325 MG per tablet Take 1 tablet by mouth every 8 (eight) hours as needed for pain.  90 tablet  3  . HYDROcodone-acetaminophen (VICODIN) 5-500 MG per tablet Take 1 tablet by mouth every 6 (six) hours as needed. Pt unsure of dosage.      [name] LORazepam (ATIVAN) 1 MG tablet Take one tablet bid prn anxiety  90 tablet  3  . metolazone (ZAROXOLYN) 2.5 MG tablet Take 1 tablet (2.5 mg total) by mouth daily.  90 tablet  3  . Multiple Vitamin (MULTIVITAMIN) tablet Take 1 tablet by mouth daily.      [name] omeprazole (PRILOSEC) 40 MG capsule Take 40 mg by mouth daily.      . potassium chloride  SA (K-DUR,KLOR-CON) 20 MEQ tablet Take 1 tablet (20 mEq total) by mouth 2 (two) times daily.  180 tablet  3  . valACYclovir (VALTREX) 1000 MG tablet Take 1 tablet (1,000 mg total) by mouth 2 (two) times daily.  20 tablet  0   Current Facility-Administered Medications on File Prior to Visit  Medication Dose Route Frequency Provider Last Rate Last Dose  . testosterone cypionate (DEPOTESTOTERONE CYPIONATE) injection 100 mg  100 mg Intramuscular Weekly Marzella Schlein McClung, PA-C   100 mg at 07/25/13 1904   No Active Allergies  Family History  Problem Relation Age of Onset  . Thyroid disease Mother   . Heart disease Father     Review of Systems  Constitutional: Positive for diaphoresis. Negative for fever and chills.  HENT: Positive for neck pain.   Cardiovascular: Positive for leg swelling. Negative for chest pain and palpitations.  Gastrointestinal: Negative for abdominal pain, diarrhea and constipation.  Endocrine: Positive for heat intolerance.  Musculoskeletal: Positive for myalgias, back pain, joint swelling and gait problem.  Skin: Negative for color change and wound.  Neurological: Positive for dizziness, weakness, light-headedness, numbness and headaches.      BP 132/80  Pulse 91  Temp(Src) 98.2 F (36.8 C) (Oral)  Resp 18  Ht 5\' 9"  (1.753 m)  Wt 237 lb (107.502 kg)  BMI 34.98 kg/m2  SpO2 94% Objective:   Physical Exam  Constitutional: He is oriented to person, place, and time. He appears well-developed and well-nourished. No distress.  HENT:  Head: Normocephalic and atraumatic.  Eyes: No scleral icterus.  Pulmonary/Chest: Effort normal.  Neurological: He is alert and oriented to person, place, and time.  Skin: Skin is warm and dry. He is not diaphoretic.  Large fleshy pink well-defined skin polyp  Psychiatric: He has a normal mood and affect. His behavior is normal.    Results for orders placed in visit on 08/03/13  POCT GLYCOSYLATED HEMOGLOBIN (HGB A1C)      Result  Value Range   Hemoglobin A1C 5.6    POCT URINALYSIS DIPSTICK      Result Value Range   Color, UA yellow     Clarity, UA clear     Glucose, UA neg     Bilirubin, UA neg     Ketones, UA neg     Spec Grav, UA 1.020  Blood, UA neg     pH, UA 5.5     Protein, UA neg     Urobilinogen, UA 0.2     Nitrite, UA neg     Leukocytes, UA Negative         [facility] reading (PRIMARY) by  Dr. Clelia Croft. EKG: NSR, no ischemic changes Negative orthostatics other than HR which I suspect is artifact from pt talking and moving during exam. Assessment & Plan:  Dizziness and giddiness - Plan: EKG 12-Lead, EKG 12-Lead, POCT glycosylated hemoglobin (Hb A1C), Basic metabolic panel - rec cardiology referral for Holter/event monitor but pt declines - "would rather just drop dead than lug around a monitor for a few days."  Thyroid checked 3 mos ago by Dr. Merla Riches and was nml. Calcium always nml so do not suspect PTH abnmlity.  Polyp of skin -  Benign - pt reassured - offered removal for comfort - pt declined tonight but might do in future.  Encounter for long-term (current) use of other medications - Plan: Basic metabolic panel, PSA, Testosterone  Elevated PSA - Plan: PSA - recheck as last was 4.3 - forward to Dr. Annabell Howells  Impaired fasting glucose  - Plan: POCT glycosylated hemoglobin (Hb A1C)  Hypogonadism male - Plan: Testosterone - rx'ed and managed by dr. Annabell Howells, rechecked level per pt request, will forward to Dr. Annabell Howells  Chronic renal disease, unspecified stage - Plan: POCT urinalysis dipstick, Microalbumin/Creatinine Ratio, Urine - stable but new information for pt. Recheck, with urine microalb - eGFR 39% by last labs - consider nephrology referral.

## 2013-08-03 NOTE — Patient Instructions (Addendum)
We have sent your urine off for tests to see if you have any signs of chronic renal failure in your urine. Your kidney numbers are much higher than the "normal" range but have been stable for years so may just be your normal. No signs of pre-diabetes or any metabolism abnormality.  For the next step of evaluation of your dizziness after eating you may need a heart monitor if that is a step you choose to take. We will forward your prostate and testosterone labs to Dr. Annabell Howells for further management. Please make an appointment with your PCP to discuss your concerns further or make an appointment with Dr. Chilton Si or Dr. Conley Rolls to evaluate your knee further.

## 2013-08-04 LAB — BASIC METABOLIC PANEL
BUN: 23 mg/dL (ref 6–23)
CO2: 29 mEq/L (ref 19–32)
Chloride: 95 mEq/L — ABNORMAL LOW (ref 96–112)
Sodium: 137 mEq/L (ref 135–145)

## 2013-08-05 LAB — MICROALBUMIN / CREATININE URINE RATIO
Creatinine, Urine: 105.4 mg/dL
Microalb Creat Ratio: 5.7 mg/g (ref 0.0–30.0)
Microalb, Ur: 0.6 mg/dL (ref 0.00–1.89)

## 2013-08-11 ENCOUNTER — Ambulatory Visit (INDEPENDENT_AMBULATORY_CARE_PROVIDER_SITE_OTHER): Payer: Medicare Other | Admitting: Family Medicine

## 2013-08-11 DIAGNOSIS — E291 Testicular hypofunction: Secondary | ICD-10-CM

## 2013-08-17 ENCOUNTER — Ambulatory Visit (INDEPENDENT_AMBULATORY_CARE_PROVIDER_SITE_OTHER): Payer: Medicare Other | Admitting: *Deleted

## 2013-08-17 VITALS — BP 140/78 | HR 80 | Temp 98.2°F

## 2013-08-17 DIAGNOSIS — E291 Testicular hypofunction: Secondary | ICD-10-CM

## 2013-08-22 ENCOUNTER — Ambulatory Visit (INDEPENDENT_AMBULATORY_CARE_PROVIDER_SITE_OTHER): Payer: Medicare Other | Admitting: Emergency Medicine

## 2013-08-22 ENCOUNTER — Ambulatory Visit: Payer: Medicare Other

## 2013-08-22 ENCOUNTER — Encounter: Payer: Self-pay | Admitting: Emergency Medicine

## 2013-08-22 VITALS — BP 140/78 | HR 59 | Temp 97.9°F | Resp 16 | Ht 69.0 in | Wt 244.0 lb

## 2013-08-22 DIAGNOSIS — R635 Abnormal weight gain: Secondary | ICD-10-CM

## 2013-08-22 DIAGNOSIS — Z0000 Encounter for general adult medical examination without abnormal findings: Secondary | ICD-10-CM | POA: Diagnosis not present

## 2013-08-22 DIAGNOSIS — M549 Dorsalgia, unspecified: Secondary | ICD-10-CM

## 2013-08-22 DIAGNOSIS — E291 Testicular hypofunction: Secondary | ICD-10-CM | POA: Diagnosis not present

## 2013-08-22 DIAGNOSIS — R0602 Shortness of breath: Secondary | ICD-10-CM

## 2013-08-22 DIAGNOSIS — R0609 Other forms of dyspnea: Secondary | ICD-10-CM | POA: Diagnosis not present

## 2013-08-22 DIAGNOSIS — Z125 Encounter for screening for malignant neoplasm of prostate: Secondary | ICD-10-CM

## 2013-08-22 DIAGNOSIS — R609 Edema, unspecified: Secondary | ICD-10-CM | POA: Diagnosis not present

## 2013-08-22 DIAGNOSIS — Z79899 Other long term (current) drug therapy: Secondary | ICD-10-CM

## 2013-08-22 DIAGNOSIS — R0600 Dyspnea, unspecified: Secondary | ICD-10-CM

## 2013-08-22 DIAGNOSIS — L989 Disorder of the skin and subcutaneous tissue, unspecified: Secondary | ICD-10-CM

## 2013-08-22 LAB — CBC WITH DIFFERENTIAL/PLATELET
Basophils Absolute: 0 10*3/uL (ref 0.0–0.1)
Basophils Relative: 1 % (ref 0–1)
HCT: 41.8 % (ref 39.0–52.0)
Hemoglobin: 14.8 g/dL (ref 13.0–17.0)
Lymphocytes Relative: 18 % (ref 12–46)
Monocytes Absolute: 0.7 10*3/uL (ref 0.1–1.0)
Monocytes Relative: 10 % (ref 3–12)
Neutro Abs: 4.5 10*3/uL (ref 1.7–7.7)
Platelets: 174 10*3/uL (ref 150–400)
RBC: 4.82 MIL/uL (ref 4.22–5.81)
WBC: 6.6 10*3/uL (ref 4.0–10.5)

## 2013-08-22 LAB — POCT URINALYSIS DIPSTICK
Glucose, UA: NEGATIVE
Ketones, UA: NEGATIVE
Leukocytes, UA: NEGATIVE
Nitrite, UA: NEGATIVE
Protein, UA: NEGATIVE
pH, UA: 5.5

## 2013-08-22 LAB — LIPID PANEL
Cholesterol: 154 mg/dL (ref 0–200)
HDL: 36 mg/dL — ABNORMAL LOW (ref >39)
Total CHOL/HDL Ratio: 4.3 Ratio
VLDL: 27 mg/dL (ref 0–40)

## 2013-08-22 LAB — COMPREHENSIVE METABOLIC PANEL
ALT: 25 U/L (ref 0–53)
Alkaline Phosphatase: 66 U/L (ref 39–117)
BUN: 11 mg/dL (ref 6–23)
CO2: 28 mEq/L (ref 19–32)
Creat: 1.41 mg/dL — ABNORMAL HIGH (ref 0.50–1.35)
Total Bilirubin: 0.6 mg/dL (ref 0.3–1.2)

## 2013-08-22 LAB — TSH: TSH: 2.081 u[IU]/mL (ref 0.350–4.500)

## 2013-08-22 LAB — IFOBT (OCCULT BLOOD): IFOBT: POSITIVE

## 2013-08-22 MED ORDER — HYDROCODONE-ACETAMINOPHEN 5-325 MG PO TABS: 1.0000 | ORAL_TABLET | Freq: Three times a day (TID) | ORAL | Status: DC | PRN

## 2013-08-22 NOTE — Progress Notes (Signed)
  Subjective:    Patient ID: [name], male    DOB: 01/25/1941, 72 y.o.   MRN: [id number]  HPI Pt presents to clinic for CPE.   Review of Systems  Constitutional: Positive for fatigue and unexpected weight change.  HENT: Positive for dental problem and sinus pressure.   Eyes: Positive for discharge.  Respiratory: Positive for apnea and shortness of breath.   Cardiovascular: Positive for leg swelling.  Endocrine: Negative.   Genitourinary: Positive for urgency, frequency, difficulty urinating and genital sores.  Musculoskeletal: Positive for arthralgias, back pain, gait problem, joint swelling and myalgias.  Psychiatric/Behavioral: Positive for dysphoric mood and decreased concentration. The patient is nervous/anxious.        Objective:   Physical Exam HEENT exam reveals a crusted ulcerated area behind the left ear. The neck is supple and no carotid bruits were heard. Chest exam revealed a few basilar rousable and no dullness to cardiac exam revealed a 1/6 systolic murmur at the left sternal border but no gallop rhythm. Abdomen is obese liver and spleen not enlarged or no tenderness no masses felt to extremity exam reveals severe stasis changes bilaterally with bilateral varicose veins and 2+ edema  EKG normal sinus rhythm  [facility] reading (PRIMARY) by  Dr. Cleta Alberts the right diaphragm is elevated the right hilum is prominent similar to previous. There does appear to be an increase in vascular markings and prominence to the ascending aorta.     Assessment & Plan:  Patient has been referred to Dr. Jorja Loa because of these skin abnormalities. Referral made to Dr. Allyson Sabal because of his abnormal chest x-ray and evidence of worsening failure by chest x-ray. I did increase his medications to where he now resumes his Zaroxolyn 2.5 twice a day and he will take his potassium twice a day. Hydrocodone was refilled for his chronic back pain. He is scheduled to have a repeat epidural next week with Dr.  Epimenio Foot. He remains chronically depressed. He is not audible way to kill himself but would be happy if he died as and feels his family would be better off. He is trying to get a friend here from the [place] to live with him. His house is currently been condemned but he is allowed to stay there since it has no financial value . 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

## 2013-08-22 NOTE — Progress Notes (Deleted)
Patient ID: KALIQ LEGE MRN: [id number], DOB: Sep 18, 1941 72 y.o. Date of Encounter: 08/22/2013, 11:40 AM  Primary Physician: Lucilla Edin, MD  Chief Complaint: Physical (CPE)  HPI: 72 y.o. y/o male with history noted below here for CPE.  Doing well. No issues/complaints.      Objective:   Physical Exam HEENT exam reveals a crusted ulcerated area behind the left ear. The neck is supple and no carotid bruits were heard. Chest exam revealed a few basilar rousable and no dullness to cardiac exam revealed a 1/6 systolic murmur at the left sternal border but no gallop rhythm. Abdomen is obese liver and spleen not enlarged or no tenderness no masses felt to extremity exam reveals severe stasis changes bilaterally with bilateral varicose veins and 2+ edema  EKG normal sinus rhythm  [facility] reading (PRIMARY) by  Dr. Cleta Alberts the right diaphragm is elevated the right hilum is prominent similar to previous. There does appear to be an increase in vascular markings and prominence to the ascending aorta.    Past Medical History  Diagnosis Date  . Arthritis   . 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 Sleep apnea   . 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 Epidural steroid injections      every 6 months   Dr. Despina Arias  . Hernia repair  2003  . Back surgery  2003    L4, L5      Home Meds:  Prior to Admission medications   Medication Sig Start Date End Date Taking? Authorizing Provider  aspirin 81 MG tablet Take 81 mg by mouth daily.   Yes Historical Provider, MD  buPROPion (WELLBUTRIN XL) 300 MG 24 hr tablet TAKE 1 TABLET BY MOUTH EVERY MORNING 05/02/13  Yes Tonye Pearson, MD  calcium carbonate 200 MG capsule Take 250 mg by mouth daily.   Yes Historical Provider, MD  cetirizine (ZYRTEC) 10 MG chewable tablet Chew 10 mg by mouth daily.   Yes Historical Provider, MD  doxazosin (CARDURA) 8 MG tablet TAKE 1  TABLET BY MOUTH ATBEDTIME 05/02/13  Yes Tonye Pearson, MD  flurazepam [facility]) 15 MG capsule Take 1 capsule (15 mg total) by mouth at bedtime as needed for sleep. 08/12/12  Yes [name], MD  fluticasone (FLONASE) 50 MCG/ACT nasal spray Place 2 sprays into the nose daily. 07/28/12  Yes Judi Saa, DO  glucosamine-chondroitin 500-400 MG tablet Take 1 tablet by mouth 3 (three) times daily.   Yes Historical Provider, MD  HYDROcodone-acetaminophen (NORCO/VICODIN) 5-325 MG per tablet Take 1 tablet by mouth every 8 (eight) hours as needed for pain. 03/24/13  Yes [name], MD  HYDROcodone-acetaminophen (VICODIN) 5-500 MG per tablet Take 1 tablet by mouth every 6 (six) hours as needed. Pt unsure of dosage.   Yes Historical Provider, MD  LORazepam (ATIVAN) 1 MG tablet Take one tablet bid prn anxiety 03/24/13  Yes [name], MD  metolazone (ZAROXOLYN) 2.5 MG tablet Take 1 tablet (2.5 mg total) by mouth daily. 04/25/13  Yes Tonye Pearson, MD  Multiple Vitamin (MULTIVITAMIN) tablet Take 1 tablet by mouth daily.   Yes Historical Provider, MD  omeprazole (PRILOSEC) 40 MG capsule Take 40 mg by mouth daily.   Yes Historical Provider, MD  potassium chloride SA (K-DUR,KLOR-CON) 20 MEQ tablet Take 1 tablet (20 mEq total) by mouth 2 (two) times  daily. 04/25/13  Yes Tonye Pearson, MD  Diphenhyd-Hydrocort-Nystatin (FIRST-DUKES MOUTHWASH) SUSP 1 teaspoon his rinse gargle and spit 4 times a day 07/30/12   [name], MD  furosemide (LASIX) 40 MG tablet TAKE 1 TABLET BY MOUTH ONCE A DAY 04/25/13   Tonye Pearson, MD  valACYclovir (VALTREX) 1000 MG tablet Take 1 tablet (1,000 mg total) by mouth 2 (two) times daily. 07/30/12   [name], MD    Allergies: No Known Allergies  History   Social History  . Marital Status: Single    Spouse Name: N/A    Number of Children: N/A  . Years of Education: N/A   Occupational History  . Not on file.   Social History Main Topics  . Smoking  status: Never Smoker   . Smokeless tobacco: Never Used  . Alcohol Use: No  . Drug Use: No  . Sexual Activity: Not on file   Other Topics Concern  . Not on file   Social History Narrative  . No narrative on file    Family History  Problem Relation Age of Onset  . Thyroid disease Mother   . Heart disease Father     Physical Exam: Blood pressure 140/78, pulse 59, temperature 97.9 F (36.6 C), temperature source Oral, resp. rate 16, height 5\' 9"  (1.753 m), weight 244 lb (110.678 kg), SpO2 98.00%.  General: Well developed, well nourished, in no acute distress. Consitutional: No fever, chills, positive fatigue, night sweats, lymphadenopathy, positive weight changes HEENT: Normocephalic, atraumatic. Conjunctiva pink, sclera non-icteric. Pupils 2 mm constricting to 1 mm, round, regular, and equally reactive to light and accomodation. EOMI. Internal auditory canal clear. TMs with good cone of light and without pathology. Nasal mucosa pink. Nares are without discharge. No sinus tenderness. Oral mucosa pink. Dentition***. Pharynx without exudate.   Neck: Supple. Trachea midline. No thyromegaly. Full ROM. No lymphadenopathy. Lungs: Clear to auscultation bilaterally without wheezes, rales, or rhonchi. Breathing is of normal effort and unlabored. Respiratory: No cough, hemoptysis, positive SOB, or wheezing, positive apnea. Cardiovascular: RRR with S1 S2. No murmurs, rubs, or gallops appreciated. Distal pulses 2+ symmetrically. No carotid or abdominal bruits. Positive leg swelling Abdomen: Soft, non-tender, non-distended with normoactive bowel sounds. No hepatosplenomegaly or masses. No rebound/guarding. No CVA tenderness. Without hernias.  Gastrointestinal: No anorexia, dysphagia, reflux, pain, nausea, vomiting, hematemesis, diarrhea, constipation, BRBPR, or melena. Rectal: No external hemorrhoids or fissures. Rectal vault without masses.***  Genitourinary: *** circumcised male. No penile lesions.  Testes descended bilaterally, and smooth without tenderness or masses.  Musculoskeletal: Full range of motion and 5/5 strength throughout. Without swelling, atrophy, tenderness, crepitus, or warmth. Extremities without clubbing, cyanosis, or edema. Calves supple. Skin: Warm and moist without erythema, ecchymosis, wounds, or rash. Neurological: No headache, dizziness, syncope, seizures, tremors, memory loss, coordination problems, or paresthesias. Psychological: No anxiety, depression, hallucinations, SI/HI. Neuro: A+Ox3. CN II-XII grossly intact. Moves all extremities spontaneously. Full sensation throughout. Normal gait. DTR 2+ throughout upper and lower extremities. Finger to nose intact. Psych:  Responds to questions appropriately with a normal affect. Endocrine: No fatigue, polydipsia, polyphagia, polyuria, or known diabetes.   Assessment & Plan:  Patient has been referred to Dr. Jorja Loa because of these skin abnormalities. Referral made to Dr. Allyson Sabal because of his abnormal chest x-ray and evidence of worsening failure by chest x-ray. I did increase his medications to where he now resumes his Zaroxolyn 2.5 twice a day and he will take his potassium twice a day. Hydrocodone was  refilled for his chronic back pain. He is scheduled to have a repeat epidural next week with Dr. Epimenio Foot. He remains chronically depressed. He is not audible way to kill himself but would be happy if he died as and feels his family would be better off. He is trying to get a friend here from the [place] to live with him. His house is currently been condemned but he is allowed to stay there since it has no financial value . 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   Studies: CBC, CMET, Lipid, PSA, TSH, Vitamin D all pending. UA:   Assessment/Plan:  72 y.o. y/o male here for CPE

## 2013-08-23 LAB — BRAIN NATRIURETIC PEPTIDE: Brain Natriuretic Peptide: 12.7 pg/mL (ref 0.0–100.0)

## 2013-08-24 ENCOUNTER — Telehealth: Payer: Self-pay

## 2013-08-24 NOTE — Telephone Encounter (Signed)
Pharm requests RF of depo-testosterone 200 mg/ml.

## 2013-08-25 NOTE — Telephone Encounter (Signed)
Thank you. I have called him to advise him to follow up with Dr Annabell Howells. Left message for him to call me back.

## 2013-08-25 NOTE — Telephone Encounter (Signed)
Also looks like urology was concerned about elevated PSA in April, he was changed at that time to Axiron. Dr Cleta Alberts advised.

## 2013-08-25 NOTE — Telephone Encounter (Signed)
His testosterone was checked in September, but note on lab indicated follow up with urologist, who is prescribing the testosterone.

## 2013-08-25 NOTE — Telephone Encounter (Signed)
Ok to refill his testosterone. Be sure patient did return to clinic to have his blood work done .

## 2013-08-25 NOTE — Telephone Encounter (Signed)
URI prescribing person needs to be the urologist.

## 2013-08-26 ENCOUNTER — Ambulatory Visit (INDEPENDENT_AMBULATORY_CARE_PROVIDER_SITE_OTHER): Payer: Medicare Other | Admitting: *Deleted

## 2013-08-26 DIAGNOSIS — E291 Testicular hypofunction: Secondary | ICD-10-CM

## 2013-08-26 MED ORDER — TESTOSTERONE CYPIONATE 200 MG/ML IM SOLN: 100.0000 mg | Freq: Once | INTRAMUSCULAR | Status: DC

## 2013-08-26 NOTE — Telephone Encounter (Signed)
He can have one refill and enough medication to last him until his urology appointment

## 2013-08-26 NOTE — Telephone Encounter (Signed)
Patient advised the urologist should advise on this. He indicates he only has enough for 1 dose, he is scheduled to see them in one month. I have called pharmacy and advised them to send request to [facility], he is asking if you can fill it until he sees the urologist. Please advise.

## 2013-08-28 MED ORDER — TESTOSTERONE CYPIONATE 200 MG/ML IM SOLN: 100.0000 mg | INTRAMUSCULAR | Status: DC

## 2013-08-28 NOTE — Telephone Encounter (Signed)
Rx sent to [facility]

## 2013-08-29 DIAGNOSIS — M47817 Spondylosis without myelopathy or radiculopathy, lumbosacral region: Secondary | ICD-10-CM | POA: Diagnosis not present

## 2013-08-29 DIAGNOSIS — M25569 Pain in unspecified knee: Secondary | ICD-10-CM | POA: Diagnosis not present

## 2013-08-29 DIAGNOSIS — IMO0002 Reserved for concepts with insufficient information to code with codable children: Secondary | ICD-10-CM | POA: Diagnosis not present

## 2013-09-01 ENCOUNTER — Ambulatory Visit (INDEPENDENT_AMBULATORY_CARE_PROVIDER_SITE_OTHER): Payer: Medicare Other

## 2013-09-01 DIAGNOSIS — E291 Testicular hypofunction: Secondary | ICD-10-CM

## 2013-09-12 ENCOUNTER — Ambulatory Visit (INDEPENDENT_AMBULATORY_CARE_PROVIDER_SITE_OTHER): Payer: Medicare Other | Admitting: Radiology

## 2013-09-12 DIAGNOSIS — E291 Testicular hypofunction: Secondary | ICD-10-CM

## 2013-09-12 DIAGNOSIS — Z23 Encounter for immunization: Secondary | ICD-10-CM | POA: Diagnosis not present

## 2013-09-22 ENCOUNTER — Ambulatory Visit (INDEPENDENT_AMBULATORY_CARE_PROVIDER_SITE_OTHER): Payer: Medicare Other | Admitting: *Deleted

## 2013-09-22 DIAGNOSIS — E291 Testicular hypofunction: Secondary | ICD-10-CM | POA: Diagnosis not present

## 2013-10-03 ENCOUNTER — Other Ambulatory Visit: Payer: Self-pay | Admitting: Radiology

## 2013-10-03 ENCOUNTER — Ambulatory Visit (INDEPENDENT_AMBULATORY_CARE_PROVIDER_SITE_OTHER): Payer: Medicare Other | Admitting: Emergency Medicine

## 2013-10-03 VITALS — BP 110/72 | HR 65 | Temp 98.2°F | Resp 18 | Ht 69.0 in | Wt 238.0 lb

## 2013-10-03 DIAGNOSIS — I509 Heart failure, unspecified: Secondary | ICD-10-CM

## 2013-10-03 DIAGNOSIS — R609 Edema, unspecified: Secondary | ICD-10-CM

## 2013-10-03 DIAGNOSIS — E291 Testicular hypofunction: Secondary | ICD-10-CM | POA: Diagnosis not present

## 2013-10-03 DIAGNOSIS — M549 Dorsalgia, unspecified: Secondary | ICD-10-CM

## 2013-10-03 LAB — BASIC METABOLIC PANEL
BUN: 18 mg/dL (ref 6–23)
CO2: 26 mEq/L (ref 19–32)
Chloride: 101 mEq/L (ref 96–112)
Creat: 1.29 mg/dL (ref 0.50–1.35)
Glucose, Bld: 85 mg/dL (ref 70–99)

## 2013-10-03 MED ORDER — TESTOSTERONE CYPIONATE 200 MG/ML IM SOLN: 100.0000 mg | INTRAMUSCULAR | Status: DC

## 2013-10-03 NOTE — Progress Notes (Signed)
Depo Testosterone .5ml given in the right UOQ at 215pm AER

## 2013-10-03 NOTE — Progress Notes (Addendum)
This chart was scribed for Lesle Chris, MD by Joaquin Music, ED Scribe. This patient was seen in room Room/bed 10 and the patient's care was started at 1:14 PM. Subjective:    Patient ID: [name], male    DOB: Mar 05, 1941, 72 y.o.   MRN: [id number] Chief Complaint  Patient presents with  . Follow-up  . rx refills    norco   . testosterone injection   HPI [name] is a 72 y.o. male who presents to the [facility] complaining of rx refill, F/U, and testosterone injection. Pt state he has been having cramps and suspects this is due to low potassium levels.  Pt states he has not been taking double dose of his medications. He states he was not sure he had to take a double dose. He was under the impression the double dose was for the first week.  His current medications are: Current outpatient prescriptions:aspirin 81 MG tablet, Take 81 mg by mouth daily., Disp: , Rfl: ;  buPROPion (WELLBUTRIN XL) 300 MG 24 hr tablet, TAKE 1 TABLET BY MOUTH EVERY MORNING, Disp: 90 tablet, Rfl: 1;  calcium carbonate 200 MG capsule, Take 250 mg by mouth daily., Disp: , Rfl: ;  cetirizine (ZYRTEC) 10 MG chewable tablet, Chew 10 mg by mouth daily., Disp: , Rfl:  Diphenhyd-Hydrocort-Nystatin (FIRST-DUKES MOUTHWASH) SUSP, 1 teaspoon his rinse gargle and spit 4 times a day, Disp: 120 mL, Rfl: 1;  doxazosin (CARDURA) 8 MG tablet, TAKE 1 TABLET BY MOUTH ATBEDTIME, Disp: 90 tablet, Rfl: 1;  flurazepam (DALMANE) 15 MG capsule, Take 1 capsule (15 mg total) by mouth at bedtime as needed for sleep., Disp: 30 capsule, Rfl: 5 fluticasone (FLONASE) 50 MCG/ACT nasal spray, Place 2 sprays into the nose daily., Disp: 16 g, Rfl: 6;  furosemide (LASIX) 40 MG tablet, TAKE 1 TABLET BY MOUTH ONCE A DAY, Disp: 90 tablet, Rfl: 3;  glucosamine-chondroitin 500-400 MG tablet, Take 1 tablet by mouth 3 (three) times daily., Disp: , Rfl:  HYDROcodone-acetaminophen (NORCO/VICODIN) 5-325 MG per tablet, Take 1 tablet by mouth every 8  (eight) hours as needed for pain., Disp: 90 tablet, Rfl: 0;  HYDROcodone-acetaminophen (VICODIN) 5-500 MG per tablet, Take 1 tablet by mouth every 6 (six) hours as needed. Pt unsure of dosage., Disp: , Rfl: ;  LORazepam (ATIVAN) 1 MG tablet, Take one tablet bid prn anxiety, Disp: 90 tablet, Rfl: 3 metolazone (ZAROXOLYN) 2.5 MG tablet, Take 1 tablet (2.5 mg total) by mouth daily., Disp: 90 tablet, Rfl: 3;  Multiple Vitamin (MULTIVITAMIN) tablet, Take 1 tablet by mouth daily., Disp: , Rfl: ;  omeprazole (PRILOSEC) 40 MG capsule, Take 40 mg by mouth daily., Disp: , Rfl: ;  potassium chloride SA (K-DUR,KLOR-CON) 20 MEQ tablet, Take 1 tablet (20 mEq total) by mouth 2 (two) times daily., Disp: 180 tablet, Rfl: 3 testosterone cypionate (DEPOTESTOTERONE CYPIONATE) 200 MG/ML injection, Inject 0.5 mLs (100 mg total) into the muscle every 7 (seven) days., Disp: 10 mL, Rfl: 0;  valACYclovir (VALTREX) 1000 MG tablet, Take 1 tablet (1,000 mg total) by mouth 2 (two) times daily., Disp: 20 tablet, Rfl: 0 Current facility-administered medications:testosterone cypionate (DEPOTESTOTERONE CYPIONATE) injection 100 mg, 100 mg, Intramuscular, Weekly, Anders Simmonds, PA-C, 100 mg at 09/22/13 2033  Patient Active Problem List   Diagnosis Date Noted  . Swelling in head/neck 07/28/2012  . 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 Pain in limb 02/12/2012  . HYPERLIPIDEMIA 02/28/2008  . ALLERGIC RHINITIS 02/28/2008  .  SLEEP APNEA 02/28/2008   Review of Systems  All other systems reviewed and are negative.   Objective:   Physical Exam CONSTITUTIONAL: Well developed/well nourished HEAD: Normocephalic/atraumatic EYES: EOMI/PERRL ENMT: Mucous membranes moist NECK: supple no meningeal signs SPINE:entire spine nontender CV: S1/S2 noted, no murmurs/rubs/gallops noted LUNGS: Lungs are clear to auscultation bilaterally, no apparent distress his lungs were clear today there  are no rales. ABDOMEN: soft, nontender, no rebound or guarding GU:no cva tenderness NEURO: Pt is awake/alert, moves all extremitiesx4 EXTREMITIES: pulses normal, full ROM SKIN: warm, color normal PSYCH: no abnormalities of mood noted   Triage Vitals:BP 110/72  Pulse 65  Temp(Src) 98.2 F (36.8 C) (Oral)  Resp 18  Ht 5\' 9"  (1.753 m)  Wt 238 lb (110.678 kg)  BMI 36.02 kg/m2  SpO2 96% Assessment & Plan:  He has not been taking his potassium as prescribed. He is supposed to be taking it twice a day. I did go ahead and do a basic metabolic panel today to check his potassium level. He will continue his testosterone 0.5 a weekly basis. He is going to call personally and make the appointment with Dr. Terri Piedra for skin check. A second referral was done to Dr. Allyson Sabal for cardiac evaluation. He is currently taking Lasix 40 every day he takes Zaroxolyn every other day and he will be taking potassium twice a day. He does have a elevated creatinine level.  I personally performed the services described in this documentation, which was scribed in my presence. The recorded information has been reviewed and is accurate.

## 2013-10-03 NOTE — Telephone Encounter (Signed)
Patient requesting his pain medication renewed, pended please advise.

## 2013-10-04 MED ORDER — HYDROCODONE-ACETAMINOPHEN 5-325 MG PO TABS: 1.0000 | ORAL_TABLET | Freq: Three times a day (TID) | ORAL | Status: DC | PRN

## 2013-10-18 ENCOUNTER — Ambulatory Visit (INDEPENDENT_AMBULATORY_CARE_PROVIDER_SITE_OTHER): Payer: Medicare Other | Admitting: *Deleted

## 2013-10-18 DIAGNOSIS — E291 Testicular hypofunction: Secondary | ICD-10-CM

## 2013-10-27 ENCOUNTER — Ambulatory Visit (INDEPENDENT_AMBULATORY_CARE_PROVIDER_SITE_OTHER): Payer: Medicare Other | Admitting: Radiology

## 2013-10-27 DIAGNOSIS — E291 Testicular hypofunction: Secondary | ICD-10-CM | POA: Diagnosis not present

## 2013-10-27 DIAGNOSIS — R7989 Other specified abnormal findings of blood chemistry: Secondary | ICD-10-CM

## 2013-10-27 MED ORDER — TESTOSTERONE CYPIONATE 100 MG/ML IM SOLN
200.0000 mg | Freq: Once | INTRAMUSCULAR | Status: AC
  Administered 2013-10-27: 200 mg via INTRAMUSCULAR

## 2013-11-03 ENCOUNTER — Ambulatory Visit (INDEPENDENT_AMBULATORY_CARE_PROVIDER_SITE_OTHER): Payer: Medicare Other | Admitting: *Deleted

## 2013-11-03 VITALS — BP 136/72 | HR 72 | Temp 98.2°F | Resp 18 | Ht 69.0 in | Wt 228.8 lb

## 2013-11-03 DIAGNOSIS — E291 Testicular hypofunction: Secondary | ICD-10-CM

## 2013-11-03 MED ORDER — TESTOSTERONE CYPIONATE 200 MG/ML IM SOLN
100.0000 mg | INTRAMUSCULAR | Status: DC
  Administered 2013-11-03 – 2014-09-08 (×11): 100 mg via INTRAMUSCULAR

## 2013-11-03 NOTE — Addendum Note (Signed)
Addended by: Ihor Dow on: 11/03/2013 03:38 PM   Modules accepted: Level of Service

## 2013-11-09 ENCOUNTER — Other Ambulatory Visit: Payer: Self-pay | Admitting: Internal Medicine

## 2013-11-10 HISTORY — PX: VASCULAR SURGERY: SHX849

## 2013-11-15 ENCOUNTER — Ambulatory Visit (INDEPENDENT_AMBULATORY_CARE_PROVIDER_SITE_OTHER): Payer: Medicare Other | Admitting: Family Medicine

## 2013-11-15 DIAGNOSIS — E291 Testicular hypofunction: Secondary | ICD-10-CM

## 2013-11-22 ENCOUNTER — Telehealth: Payer: Self-pay

## 2013-11-22 ENCOUNTER — Other Ambulatory Visit: Payer: Self-pay | Admitting: Family Medicine

## 2013-11-22 DIAGNOSIS — M549 Dorsalgia, unspecified: Secondary | ICD-10-CM

## 2013-11-22 NOTE — Telephone Encounter (Signed)
Dissented prescription for Wellbutrin for one year period. Refilled Norco 5-325  one daily #90 no refills

## 2013-11-22 NOTE — Telephone Encounter (Signed)
Med renew  HYDROcodone-acetaminophen (NORCO/VICODIN) 5-325 MG per tablet buPROPion (WELLBUTRIN XL) 300 MG 24 hr tablet   [phone]

## 2013-11-23 ENCOUNTER — Ambulatory Visit (INDEPENDENT_AMBULATORY_CARE_PROVIDER_SITE_OTHER): Payer: Medicare Other | Admitting: *Deleted

## 2013-11-23 DIAGNOSIS — E291 Testicular hypofunction: Secondary | ICD-10-CM | POA: Diagnosis not present

## 2013-11-23 MED ORDER — HYDROCODONE-ACETAMINOPHEN 5-325 MG PO TABS: 1.0000 | ORAL_TABLET | Freq: Three times a day (TID) | ORAL | Status: DC | PRN

## 2013-11-23 MED ORDER — BUPROPION HCL ER (XL) 300 MG PO TB24: ORAL_TABLET | ORAL | Status: DC

## 2013-11-23 NOTE — Telephone Encounter (Signed)
Verified with Dr. Everlene Farrier. Ok to send prescriptions. Pt notified. Norco was faxed over to [facility].

## 2013-12-02 ENCOUNTER — Other Ambulatory Visit: Payer: Self-pay | Admitting: Family Medicine

## 2013-12-05 ENCOUNTER — Ambulatory Visit (INDEPENDENT_AMBULATORY_CARE_PROVIDER_SITE_OTHER): Payer: Medicare Other | Admitting: Radiology

## 2013-12-05 DIAGNOSIS — E291 Testicular hypofunction: Secondary | ICD-10-CM | POA: Diagnosis not present

## 2013-12-06 ENCOUNTER — Other Ambulatory Visit: Payer: Self-pay

## 2013-12-06 MED ORDER — LORAZEPAM 1 MG PO TABS: ORAL_TABLET | ORAL | Status: DC

## 2013-12-06 NOTE — Telephone Encounter (Signed)
Pharm reqs RF of lorazepam. Pended for review.

## 2013-12-07 NOTE — Telephone Encounter (Signed)
faxed

## 2013-12-14 ENCOUNTER — Ambulatory Visit (INDEPENDENT_AMBULATORY_CARE_PROVIDER_SITE_OTHER): Payer: Medicare Other | Admitting: Radiology

## 2013-12-14 DIAGNOSIS — E291 Testicular hypofunction: Secondary | ICD-10-CM | POA: Diagnosis not present

## 2013-12-19 ENCOUNTER — Ambulatory Visit (INDEPENDENT_AMBULATORY_CARE_PROVIDER_SITE_OTHER): Payer: Medicare Other | Admitting: Internal Medicine

## 2013-12-19 VITALS — BP 146/84 | HR 78 | Temp 97.6°F | Resp 18 | Ht 69.0 in | Wt 219.8 lb

## 2013-12-19 DIAGNOSIS — R634 Abnormal weight loss: Secondary | ICD-10-CM | POA: Diagnosis not present

## 2013-12-19 DIAGNOSIS — L03119 Cellulitis of unspecified part of limb: Secondary | ICD-10-CM

## 2013-12-19 DIAGNOSIS — M79609 Pain in unspecified limb: Secondary | ICD-10-CM

## 2013-12-19 DIAGNOSIS — R112 Nausea with vomiting, unspecified: Secondary | ICD-10-CM

## 2013-12-19 DIAGNOSIS — L02419 Cutaneous abscess of limb, unspecified: Secondary | ICD-10-CM | POA: Diagnosis not present

## 2013-12-19 DIAGNOSIS — L03115 Cellulitis of right lower limb: Secondary | ICD-10-CM

## 2013-12-19 DIAGNOSIS — E291 Testicular hypofunction: Secondary | ICD-10-CM

## 2013-12-19 DIAGNOSIS — J019 Acute sinusitis, unspecified: Secondary | ICD-10-CM | POA: Diagnosis not present

## 2013-12-19 DIAGNOSIS — R5381 Other malaise: Secondary | ICD-10-CM | POA: Diagnosis not present

## 2013-12-19 DIAGNOSIS — R5383 Other fatigue: Secondary | ICD-10-CM | POA: Diagnosis not present

## 2013-12-19 DIAGNOSIS — M47816 Spondylosis without myelopathy or radiculopathy, lumbar region: Secondary | ICD-10-CM

## 2013-12-19 DIAGNOSIS — M47812 Spondylosis without myelopathy or radiculopathy, cervical region: Secondary | ICD-10-CM

## 2013-12-19 DIAGNOSIS — I872 Venous insufficiency (chronic) (peripheral): Secondary | ICD-10-CM

## 2013-12-19 DIAGNOSIS — N289 Disorder of kidney and ureter, unspecified: Secondary | ICD-10-CM

## 2013-12-19 LAB — POCT URINALYSIS DIPSTICK
Bilirubin, UA: NEGATIVE
Blood, UA: NEGATIVE
GLUCOSE UA: NEGATIVE
Ketones, UA: NEGATIVE
Leukocytes, UA: NEGATIVE
NITRITE UA: NEGATIVE
PROTEIN UA: NEGATIVE
Spec Grav, UA: 1.015
UROBILINOGEN UA: 0.2
pH, UA: 5

## 2013-12-19 LAB — POCT UA - MICROSCOPIC ONLY

## 2013-12-19 LAB — POCT CBC
GRANULOCYTE PERCENT: 71 % (ref 37–80)
HCT, POC: 48.3 % (ref 43.5–53.7)
Hemoglobin: 14.2 g/dL (ref 14.1–18.1)
Lymph, poc: 1.2 (ref 0.6–3.4)
MCH, POC: 29 pg (ref 27–31.2)
MCHC: 29.4 g/dL — AB (ref 31.8–35.4)
MCV: 98.6 fL — AB (ref 80–97)
MID (CBC): 0.4 (ref 0–0.9)
MPV: 9.8 fL (ref 0–99.8)
PLATELET COUNT, POC: 174 10*3/uL (ref 142–424)
POC Granulocyte: 4 (ref 2–6.9)
POC LYMPH PERCENT: 21 %L (ref 10–50)
POC MID %: 8 % (ref 0–12)
RBC: 4.9 M/uL (ref 4.69–6.13)
RDW, POC: 13.3 %
WBC: 5.6 10*3/uL (ref 4.6–10.2)

## 2013-12-19 MED ORDER — AMOXICILLIN 500 MG PO CAPS: 1000.0000 mg | ORAL_CAPSULE | Freq: Two times a day (BID) | ORAL | Status: AC

## 2013-12-19 MED ORDER — TESTOSTERONE CYPIONATE 200 MG/ML IM SOLN
100.0000 mg | Freq: Once | INTRAMUSCULAR | Status: AC
  Administered 2013-12-19: 100 mg via INTRAMUSCULAR

## 2013-12-19 NOTE — Progress Notes (Addendum)
This chart was scribed for [name] Lin, MD by Einar Pheasant, ED Scribe. This [name] was seen in room 11 and the [name]'s care was started at 5:31 PM. Subjective:    [name] ID: [name], male    DOB: [birth year], 73 y.o.   MRN: [id number]  Chief Complaint  [name] presents with  . Cellulitis    R leg  . Fever    off and on for the past couple weeks  . Emesis    off and on for the past couple weeks  . Rectal Bleeding    2 days ago noticed blood in stool    HPI HPI Comments: [name] is a 73 y.o. male who presents to [facility] complaining of cellulitis- right leg that started months ago. [name] Pt is also complaining of associated fever 2 weeks ago, emesis 2 weeks ago, and hematochezia which he noticed two days ago, and congestion. Pt reports treating his flu like symptoms with OTC medication. He also reports 2 episodes or emesis last night and this morning. Pt states that he's lost 20 lbs since November. He states that he just does not feel like eating and his energy level has been fluctuating. Sometimes he feels lethargic after consumption  and does not feel like getting out of bed.   Pt reports hitting his shin which worsened his cellulitis on his right leg. He states that when he doesn't elevate his leg his symptoms worsen. He had been referred to vascular surgery a few months ago but was never able to find time to have surgery due to his busy work schedule.   [name] Active Problem List   Diagnosis Date Noted  . Swelling in head/neck 07/28/2012  . 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 Hypogonadism male----he is here for an injection tonight as well  03/16/2012  . Pain in limb 02/12/2012  . HYPERLIPIDEMIA 02/28/2008  . ALLERGIC RHINITIS 02/28/2008  . SLEEP APNEA 02/28/2008   Past Medical History  Diagnosis Date  . Arthritis   . 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 Sleep apnea   . Hx of [facility]  spotted fever childhood  . Allergy   . Depression   . Neuromuscular disorder   . Anxiety    Past Surgical History  Procedure Laterality Date  . Epidural steroid injections      every 6 months   Dr. Arlice Colt  . Hernia repair  2003  . Back surgery  2003    L4, L5   No Known Allergies Prior to Admission medications   Medication Sig Start Date End Date Taking? Authorizing Provider  aspirin 81 MG tablet Take 81 mg by mouth daily.   Yes Historical Provider, MD  buPROPion (WELLBUTRIN XL) 300 MG 24 hr tablet TAKE 1 TABLET BY MOUTH EVERY MORNING 11/23/13  Yes [name], MD  calcium carbonate 200 MG capsule Take 250 mg by mouth daily.   Yes Historical Provider, MD  cetirizine (ZYRTEC) 10 MG chewable tablet Chew 10 mg by mouth daily.   Yes Historical Provider, MD  Diphenhyd-Hydrocort-Nystatin (FIRST-DUKES MOUTHWASH) SUSP 1 teaspoon his rinse gargle and spit 4 times a day 07/30/12  Yes [name], MD  doxazosin (CARDURA) 8 MG tablet TAKE 1 TABLET BY MOUTH ATBEDTIME 05/02/13  Yes [name], MD  flurazepam [facility]) 15 MG capsule Take 1 capsule (15 mg total) by mouth at bedtime as needed for sleep. 08/12/12  Yes [name], MD  fluticasone (FLONASE) 50 MCG/ACT nasal spray Place 2 sprays into the nose daily. 07/28/12  Yes Lyndal Pulley, DO  furosemide (LASIX) 40 MG tablet TAKE 1 TABLET BY MOUTH ONCE A DAY 04/25/13  Yes [name], MD  glucosamine-chondroitin 500-400 MG tablet Take 1 tablet by mouth 3 (three) times daily.   Yes Historical Provider, MD  HYDROcodone-acetaminophen (NORCO/VICODIN) 5-325 MG per tablet Take 1 tablet by mouth every 8 (eight) hours as needed. 11/23/13  Yes [name], MD  LORazepam (ATIVAN) 1 MG tablet Take one tablet bid prn anxiety 12/06/13  Yes [name], MD  metolazone (ZAROXOLYN) 2.5 MG tablet Take 1 tablet (2.5 mg total) by mouth daily. 04/25/13  Yes [name], MD  Multiple Vitamin (MULTIVITAMIN) tablet Take 1 tablet by mouth  daily.   Yes Historical Provider, MD  mupirocin ointment (BACTROBAN) 2 % Apply topically 2 (two) times daily. [name] NEEDS OFFICE VISIT FOR ADDITIONAL REFILLS 12/02/13  Yes Mancel Bale, PA-C  omeprazole (PRILOSEC) 40 MG capsule Take 40 mg by mouth daily.   Yes Historical Provider, MD  potassium chloride SA (K-DUR,KLOR-CON) 20 MEQ tablet Take 1 tablet (20 mEq total) by mouth 2 (two) times daily. 04/25/13  Yes [name], MD  testosterone cypionate (DEPOTESTOTERONE CYPIONATE) 200 MG/ML injection Inject 0.5 mLs (100 mg total) into the muscle every 7 (seven) days. 10/03/13  Yes [name], MD  valACYclovir (VALTREX) 1000 MG tablet Take 1 tablet (1,000 mg total) by mouth 2 (two) times daily. 07/30/12  Yes [name], MD      Review of Systems  Constitutional: Positive for fever, activity change, appetite change, fatigue and unexpected weight change. Negative for diaphoresis.       His fatigue is at its worse after eating he has a one-hour slump often requiring a nap after a meal  HENT: Positive for congestion, nosebleeds, rhinorrhea and sinus pressure.   Eyes: Negative for visual disturbance.  Respiratory: Negative for cough, chest tightness and shortness of breath.   Cardiovascular: Negative for chest pain.  Gastrointestinal: Positive for nausea and vomiting. Negative for abdominal pain and diarrhea.  Genitourinary: Negative for difficulty urinating.  Musculoskeletal: Positive for arthralgias.  Skin: Negative for rash.  Neurological: Negative for headaches.  Hematological: Negative for adenopathy. Does not bruise/bleed easily.  Psychiatric/Behavioral:       Sleep apnea responding well to CPAP        Triage vitals: BP 146/84  Pulse 78  Temp(Src) 97.6 F (36.4 C) (Oral)  Resp 18  Ht 5\' 9"  (1.753 m)  Wt 219 lb 12.8 oz (99.701 kg)  BMI 32.44 kg/m2  SpO2 96% Objective:   Physical Exam  Nursing note and vitals reviewed. Constitutional: He is oriented to person, place, and  time. He appears well-developed and well-nourished. No distress.  HENT:  Head: Normocephalic and atraumatic.  Mouth/Throat: Posterior oropharyngeal erythema present.  There is maxillary area tenderness to percussion and purulent nasal discharge  Eyes: Conjunctivae and EOM are normal. Pupils are equal, round, and reactive to light.  Neck: Neck supple. No tracheal deviation present.  Cardiovascular: Normal rate, regular rhythm, normal heart sounds and intact distal pulses.   No murmur heard. Pulmonary/Chest: Effort normal and breath sounds normal. No respiratory distress. He has no wheezes. He exhibits no tenderness.  Abdominal: Soft. Bowel sounds are normal. He exhibits no mass. There is no tenderness.  Neurological: He is alert and oriented to person, place,  and time.  Skin: Skin is warm and dry.  The right leg has obvious vascular insufficiency with an open sore mid tibia that has minimal cellulitis around it and no exudate/there is some scant discharge on the bandage////mild pitting//induration w/ incr pigmentation around wound   Psychiatric: He has a normal mood and affect. His behavior is normal.    Results for orders placed in visit on 12/19/13  COMPLETE METABOLIC PANEL WITH GFR      Result Value Ref Range   Sodium 138  135 - 145 mEq/L   Potassium 3.9  3.5 - 5.3 mEq/L   Chloride 102  96 - 112 mEq/L   CO2 26  19 - 32 mEq/L   Glucose, Bld 82  70 - 99 mg/dL   BUN 20  6 - 23 mg/dL   Creat 1.45 (*) 0.50 - 1.35 mg/dL   Total Bilirubin 0.6  0.2 - 1.2 mg/dL   Alkaline Phosphatase 85  39 - 117 U/L   AST 20  0 - 37 U/L   ALT 22  0 - 53 U/L   Total Protein 6.9  6.0 - 8.3 g/dL   Albumin 4.0  3.5 - 5.2 g/dL   Calcium 8.9  8.4 - 10.5 mg/dL   GFR, Est African American 55 (*)    GFR, Est Non African American 48 (*)   TSH      Result Value Ref Range   TSH 1.889  0.350 - 4.500 uIU/mL  POCT CBC      Result Value Ref Range   WBC 5.6  4.6 - 10.2 K/uL   Lymph, poc 1.2  0.6 - 3.4   POC  LYMPH PERCENT 21.0  10 - 50 %L   MID (cbc) 0.4  0 - 0.9   POC MID % 8.0  0 - 12 %M   POC Granulocyte 4.0  2 - 6.9   Granulocyte percent 71.0  37 - 80 %G   RBC 4.90  4.69 - 6.13 M/uL   Hemoglobin 14.2  14.1 - 18.1 g/dL   HCT, POC 48.3  43.5 - 53.7 %   MCV 98.6 (*) 80 - 97 fL   MCH, POC 29.0  27 - 31.2 pg   MCHC 29.4 (*) 31.8 - 35.4 g/dL   RDW, POC 13.3     Platelet Count, POC 174  142 - 424 K/uL   MPV 9.8  0 - 99.8 fL  POCT URINALYSIS DIPSTICK      Result Value Ref Range   Color, UA yellow     Clarity, UA clear     Glucose, UA neg     Bilirubin, UA neg     Ketones, UA neg     Spec Grav, UA 1.015     Blood, UA neg     pH, UA 5.0     Protein, UA neg     Urobilinogen, UA 0.2     Nitrite, UA neg     Leukocytes, UA Negative           Assessment & Plan:  Acute sinusitis, unspecified -   Weight loss -decreased appetite--labs  Fatigue - labs  Cellulitis of leg, right -secondary to venous insufficiency chronic nonhealing wound  Refer to vascular surgery for opinion  Nausea with vomiting - uncertain etiology currently resolved/? 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 testosterone cypionate (DEPOTESTOTERONE CYPIONATE) injection 100 mg    Sig:   . amoxicillin (AMOXIL) 500 MG capsule    Sig: Take 2 capsules (1,000 mg total) by mouth 2 (two) times daily.    Dispense:  40 capsule    Refill:  0   Decongestants/Tylenol    I have completed the [name] encounter in its entirety as documented by the scribe, with editing by me where necessary. Robert P. Laney Pastor, M.D.

## 2013-12-20 LAB — COMPLETE METABOLIC PANEL WITH GFR
ALBUMIN: 4 g/dL (ref 3.5–5.2)
ALK PHOS: 85 U/L (ref 39–117)
ALT: 22 U/L (ref 0–53)
AST: 20 U/L (ref 0–37)
BILIRUBIN TOTAL: 0.6 mg/dL (ref 0.2–1.2)
BUN: 20 mg/dL (ref 6–23)
CO2: 26 meq/L (ref 19–32)
Calcium: 8.9 mg/dL (ref 8.4–10.5)
Chloride: 102 mEq/L (ref 96–112)
Creat: 1.45 mg/dL — ABNORMAL HIGH (ref 0.50–1.35)
GFR, Est African American: 55 mL/min — ABNORMAL LOW
GFR, Est Non African American: 48 mL/min — ABNORMAL LOW
GLUCOSE: 82 mg/dL (ref 70–99)
POTASSIUM: 3.9 meq/L (ref 3.5–5.3)
SODIUM: 138 meq/L (ref 135–145)
Total Protein: 6.9 g/dL (ref 6.0–8.3)

## 2013-12-20 LAB — TSH: TSH: 1.889 u[IU]/mL (ref 0.350–4.500)

## 2013-12-21 DIAGNOSIS — M47812 Spondylosis without myelopathy or radiculopathy, cervical region: Secondary | ICD-10-CM | POA: Insufficient documentation

## 2013-12-21 DIAGNOSIS — N289 Disorder of kidney and ureter, unspecified: Secondary | ICD-10-CM | POA: Insufficient documentation

## 2013-12-21 DIAGNOSIS — M47816 Spondylosis without myelopathy or radiculopathy, lumbar region: Secondary | ICD-10-CM | POA: Insufficient documentation

## 2013-12-23 ENCOUNTER — Other Ambulatory Visit: Payer: Self-pay | Admitting: *Deleted

## 2013-12-23 DIAGNOSIS — L97909 Non-pressure chronic ulcer of unspecified part of unspecified lower leg with unspecified severity: Secondary | ICD-10-CM

## 2013-12-24 ENCOUNTER — Encounter: Payer: Self-pay | Admitting: Internal Medicine

## 2013-12-26 ENCOUNTER — Ambulatory Visit (INDEPENDENT_AMBULATORY_CARE_PROVIDER_SITE_OTHER): Payer: Medicare Other | Admitting: *Deleted

## 2013-12-26 DIAGNOSIS — E291 Testicular hypofunction: Secondary | ICD-10-CM

## 2013-12-26 MED ORDER — TESTOSTERONE CYPIONATE 100 MG/ML IM SOLN
200.0000 mg | INTRAMUSCULAR | Status: DC
  Administered 2013-12-26 – 2014-08-22 (×2): 200 mg via INTRAMUSCULAR
  Administered 2014-09-15: 100 mg via INTRAMUSCULAR

## 2014-01-02 ENCOUNTER — Ambulatory Visit (INDEPENDENT_AMBULATORY_CARE_PROVIDER_SITE_OTHER): Payer: Medicare Other | Admitting: Radiology

## 2014-01-02 DIAGNOSIS — E291 Testicular hypofunction: Secondary | ICD-10-CM

## 2014-01-06 ENCOUNTER — Telehealth: Payer: Self-pay

## 2014-01-06 NOTE — Telephone Encounter (Signed)
Phone call from pt.  Requests appt. ASAP; reported hx of a sore on right shin x 6-7 weeks.  Reports there has not been improvement even though has been treated by PCP with prescription ointment.  Stated today there is increased redness involving 4-5" , both above, and below the ulcer.  Pt. reports "some drainage."  Stated "there is a hole the size of a dime, and approx. 1/16th " in depth."  Pt. stated over the past two days, the redness has increased, in the area of involvement, and c/o "shooting pains" in the open sore.  Has appt. on 01/10/14.  Advised pt. there is no provider in the office today, and he should go to Urgent care, or the ER with the above reported symptoms.  Verb. Understanding.  Agrees w/ plan.

## 2014-01-09 ENCOUNTER — Encounter: Payer: Self-pay | Admitting: Vascular Surgery

## 2014-01-10 ENCOUNTER — Ambulatory Visit (INDEPENDENT_AMBULATORY_CARE_PROVIDER_SITE_OTHER): Payer: Medicare Other | Admitting: Family Medicine

## 2014-01-10 ENCOUNTER — Ambulatory Visit (INDEPENDENT_AMBULATORY_CARE_PROVIDER_SITE_OTHER): Payer: Medicare Other | Admitting: Vascular Surgery

## 2014-01-10 ENCOUNTER — Encounter: Payer: Self-pay | Admitting: Vascular Surgery

## 2014-01-10 ENCOUNTER — Ambulatory Visit (HOSPITAL_COMMUNITY)
Admission: RE | Admit: 2014-01-10 | Discharge: 2014-01-10 | Disposition: A | Discharge status: Home / Self Care | Payer: Medicare Other | Source: Ambulatory Visit | Attending: Vascular Surgery | Admitting: Vascular Surgery

## 2014-01-10 VITALS — BP 138/80 | HR 77 | Resp 18 | Ht 71.0 in | Wt 220.8 lb

## 2014-01-10 DIAGNOSIS — R609 Edema, unspecified: Secondary | ICD-10-CM | POA: Diagnosis not present

## 2014-01-10 DIAGNOSIS — E291 Testicular hypofunction: Secondary | ICD-10-CM

## 2014-01-10 DIAGNOSIS — I83893 Varicose veins of bilateral lower extremities with other complications: Secondary | ICD-10-CM | POA: Diagnosis not present

## 2014-01-10 DIAGNOSIS — L97909 Non-pressure chronic ulcer of unspecified part of unspecified lower leg with unspecified severity: Secondary | ICD-10-CM

## 2014-01-10 NOTE — Progress Notes (Signed)
The patient presents today for evaluation of venous hypertension and venous stasis ulceration over his right pretibial area. I had last seen him in July of 2013. At that time he did not have open venous ulcers but had severe venous hypertension in his right leg. We had recommended laser ablation of his right great saphenous vein for improvement of his venous hypertension. He apparently did not feel he could miss work at that time and therefore did not follow through with the planned procedure and the knots been seen again until today. He has had a multiple month history of progressive changes of venous hypertension and has had several months of open ulceration over the pretibial area. 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 He does have marked swelling in his right leg. He does report occasional swelling in his left leg but there is none to present today. He also reports bilateral foot numbness which may be neuropathy. He does have a has been wearing compression garments with no effect. He does triple antibiotic over the open ulcer.  Past Medical History  Diagnosis Date  . Arthritis   . 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 Sleep apnea   . Hx of [facility] spotted fever childhood  . Allergy   . Depression   . Neuromuscular disorder   . Anxiety     History  Substance Use Topics  . Smoking status: Never Smoker   . Smokeless tobacco: Never Used  . Alcohol Use: No    Family History  Problem Relation Age of Onset  . Thyroid disease Mother   . Heart disease Father   . Diabetes Maternal Grandfather     No Known Allergies  Current outpatient prescriptions:amoxicillin (AMOXIL) 500 MG capsule, , Disp: , Rfl: ;  buPROPion (WELLBUTRIN XL) 300 MG 24 hr tablet, TAKE 1 TABLET BY MOUTH EVERY MORNING, Disp: 90 tablet, Rfl: 3;  calcium carbonate 200 MG capsule, Take 250 mg by mouth daily., Disp: , Rfl: ;  cetirizine (ZYRTEC) 10 MG  chewable tablet, Chew 10 mg by mouth daily., Disp: , Rfl:  doxazosin (CARDURA) 8 MG tablet, TAKE 1 TABLET BY MOUTH ATBEDTIME, Disp: 90 tablet, Rfl: 1;  esomeprazole (NEXIUM) 20 MG packet, Take 20 mg by mouth daily before breakfast., Disp: , Rfl: ;  flurazepam (DALMANE) 15 MG capsule, Take 1 capsule (15 mg total) by mouth at bedtime as needed for sleep., Disp: 30 capsule, Rfl: 5;  furosemide (LASIX) 40 MG tablet, TAKE 1 TABLET BY MOUTH ONCE A DAY, Disp: 90 tablet, Rfl: 3 gabapentin (NEURONTIN) 300 MG capsule, Take 300 mg by mouth 3 (three) times daily., Disp: , Rfl: ;  glucosamine-chondroitin 500-400 MG tablet, Take 1 tablet by mouth 3 (three) times daily., Disp: , Rfl: ;  HYDROcodone-acetaminophen (NORCO/VICODIN) 5-325 MG per tablet, Take 1 tablet by mouth every 8 (eight) hours as needed., Disp: 90 tablet, Rfl: 0;  LORazepam (ATIVAN) 1 MG tablet, Take one tablet bid prn anxiety, Disp: 90 tablet, Rfl: 1 meloxicam (MOBIC) 15 MG tablet, Take 15 mg by mouth daily., Disp: , Rfl: ;  methocarbamol (ROBAXIN) 500 MG tablet, Take 500 mg by mouth as needed for muscle spasms., Disp: , Rfl: ;  metolazone (ZAROXOLYN) 2.5 MG tablet, Take 1 tablet (2.5 mg total) by mouth daily., Disp: 90 tablet, Rfl: 3;  Multiple Vitamin (MULTIVITAMIN) tablet, Take 1 tablet by mouth daily., Disp: , Rfl:  omeprazole (PRILOSEC) 40 MG  capsule, Take 40 mg by mouth daily., Disp: , Rfl: ;  potassium chloride SA (K-DUR,KLOR-CON) 20 MEQ tablet, Take 1 tablet (20 mEq total) by mouth 2 (two) times daily., Disp: 180 tablet, Rfl: 3;  testosterone cypionate (DEPOTESTOTERONE CYPIONATE) 200 MG/ML injection, Inject 0.5 mLs (100 mg total) into the muscle every 7 (seven) days., Disp: 10 mL, Rfl: 0;  aspirin 81 MG tablet, Take 81 mg by mouth daily., Disp: , Rfl:  Diphenhyd-Hydrocort-Nystatin (FIRST-DUKES MOUTHWASH) SUSP, 1 teaspoon his rinse gargle and spit 4 times a day, Disp: 120 mL, Rfl: 1;  fluticasone (FLONASE) 50 MCG/ACT nasal spray, Place 2 sprays into  the nose daily., Disp: 16 g, Rfl: 6;  mupirocin ointment (BACTROBAN) 2 %, Apply topically 2 (two) times daily. PATIENT NEEDS OFFICE VISIT FOR ADDITIONAL REFILLS, Disp: 22 g, Rfl: 0 valACYclovir (VALTREX) 1000 MG tablet, Take 1 tablet (1,000 mg total) by mouth 2 (two) times daily., Disp: 20 tablet, Rfl: 0 Current facility-administered medications:testosterone cypionate (DEPOTESTOTERONE CYPIONATE) injection 100 mg, 100 mg, Intramuscular, Weekly, Argentina Donovan, PA-C, 100 mg at 01/02/14 2030;  testosterone cypionate (DEPOTESTOTERONE CYPIONATE) injection 100 mg, 100 mg, Intramuscular, Weekly, Wardell Honour, MD, 100 mg at 11/23/13 1818 testosterone cypionate (DEPOTESTOTERONE CYPIONATE) injection 200 mg, 200 mg, Intramuscular, Weekly, [name], MD, 200 mg at 12/26/13 1427  BP 138/80  Pulse 77  Resp 18  Ht 5\' 11"  (1.803 m)  Wt 220 lb 12.8 oz (100.154 kg)  BMI 30.81 kg/m2  Body mass index is 30.81 kg/(m^2).       Physical exam well-developed well-nourished gentleman acute distress Respirations are nonlabored He does have 2+ dorsalis pedis pulses bilaterally No swelling in his left leg and no skin changes Marked changes of venous hypertension in his right leg with hemosiderin deposits and erythema in an open venous ulcer over the pretibial area which is approximately 2-1/2 cm in diameter. Neurologically he is grossly intact  Venous duplex today reveals reflux in his great saphenous vein on the right. He also has reflux in his common femoral and femoral vein.  Impression and plan progression of severe venous hypertension now with open venous ulcer. I discussed this at length with the patient. I explained this is not limb threatening since he has no evidence of arterial insufficiency. He is having severe pain associated with this is a disabled from his prolonged venous ulceration. We'll start him today on Silvadene dressing and Ace wrap over this and instructed him on the use of this. I  feel that he would be an excellent candidate for ablation. He has worn compression but there is no clear documentation of this. He will discuss this with his primary care physician. I did explain that he may require 3 months conservative trial of we cannot confirm recent documentation of high compression usage. We will tentatively see him in 3 months for continued followup

## 2014-01-11 ENCOUNTER — Telehealth: Payer: Self-pay

## 2014-01-11 NOTE — Telephone Encounter (Signed)
PT WENT TO SEE ANOTHER DR YESTERDAY AND WOULD LIKE TO DISCUSS WITH DR DAUB WHAT WAS SAID PLEASE CALL [phone]

## 2014-01-12 ENCOUNTER — Telehealth: Payer: Self-pay

## 2014-01-12 NOTE — Telephone Encounter (Signed)
Patient called wanting to speak to Dr. Everlene Farrier. Patient stated that he needs to speak with Dr. Everlene Farrier today (Thursday 01-12-2014).     Thank You!!!

## 2014-01-12 NOTE — Telephone Encounter (Signed)
Spoke to pt. He wished only speek to dr Everlene Farrier concerning his doctor visit. Please give him a call at [phone]

## 2014-01-13 NOTE — Telephone Encounter (Signed)
I called patient and left a message.

## 2014-01-17 ENCOUNTER — Ambulatory Visit (INDEPENDENT_AMBULATORY_CARE_PROVIDER_SITE_OTHER): Payer: Medicare Other | Admitting: Radiology

## 2014-01-17 DIAGNOSIS — E291 Testicular hypofunction: Secondary | ICD-10-CM

## 2014-01-17 MED ORDER — TESTOSTERONE CYPIONATE 100 MG/ML IM SOLN
200.0000 mg | Freq: Once | INTRAMUSCULAR | Status: AC
  Administered 2014-01-17: 200 mg via INTRAMUSCULAR

## 2014-01-23 ENCOUNTER — Ambulatory Visit (INDEPENDENT_AMBULATORY_CARE_PROVIDER_SITE_OTHER): Payer: Medicare Other | Admitting: Radiology

## 2014-01-23 DIAGNOSIS — E291 Testicular hypofunction: Secondary | ICD-10-CM | POA: Diagnosis not present

## 2014-01-23 MED ORDER — TESTOSTERONE CYPIONATE 100 MG/ML IM SOLN
200.0000 mg | Freq: Once | INTRAMUSCULAR | Status: AC
  Administered 2014-01-23: 200 mg via INTRAMUSCULAR

## 2014-02-02 ENCOUNTER — Ambulatory Visit (INDEPENDENT_AMBULATORY_CARE_PROVIDER_SITE_OTHER): Payer: Medicare Other | Admitting: *Deleted

## 2014-02-02 DIAGNOSIS — E291 Testicular hypofunction: Secondary | ICD-10-CM | POA: Diagnosis not present

## 2014-02-07 ENCOUNTER — Other Ambulatory Visit: Payer: Self-pay | Admitting: Internal Medicine

## 2014-02-10 ENCOUNTER — Telehealth: Payer: Self-pay | Admitting: Family Medicine

## 2014-02-10 DIAGNOSIS — E291 Testicular hypofunction: Secondary | ICD-10-CM

## 2014-02-10 DIAGNOSIS — Z8639 Personal history of other endocrine, nutritional and metabolic disease: Secondary | ICD-10-CM

## 2014-02-10 DIAGNOSIS — M549 Dorsalgia, unspecified: Secondary | ICD-10-CM

## 2014-02-10 NOTE — Telephone Encounter (Signed)
Patient wants to get his testosterone refilled urgently- he says his pharmacy has not received this rx. At [place] on [address].   Pharmacy number call:  [phone] hit [phone]

## 2014-02-10 NOTE — Telephone Encounter (Signed)
Patient request refill for testosterone injection 200 mg q wk and hydrocodone 5/325.

## 2014-02-11 MED ORDER — TESTOSTERONE CYPIONATE 200 MG/ML IM SOLN: 100.0000 mg | INTRAMUSCULAR | Status: DC

## 2014-02-11 MED ORDER — HYDROCODONE-ACETAMINOPHEN 5-325 MG PO TABS: 1.0000 | ORAL_TABLET | Freq: Three times a day (TID) | ORAL | Status: DC | PRN

## 2014-02-11 NOTE — Telephone Encounter (Signed)
It is okay to refill both medication. Check the record and be sure he has had a PSA and testosterone level done in the last 6 months. If he has not he needs to come on at these 2 levels drawn.

## 2014-02-11 NOTE — Telephone Encounter (Signed)
Spoke with pt and printed meds. He is going to [facility] today for his testosterone injection and we will draw his labs at that time. Labs ordered.

## 2014-02-12 NOTE — Telephone Encounter (Signed)
I called this in for him yesterday

## 2014-02-15 ENCOUNTER — Other Ambulatory Visit (INDEPENDENT_AMBULATORY_CARE_PROVIDER_SITE_OTHER): Payer: Medicare Other | Admitting: *Deleted

## 2014-02-15 DIAGNOSIS — E291 Testicular hypofunction: Secondary | ICD-10-CM

## 2014-02-15 DIAGNOSIS — Z862 Personal history of diseases of the blood and blood-forming organs and certain disorders involving the immune mechanism: Secondary | ICD-10-CM

## 2014-02-15 DIAGNOSIS — Z8639 Personal history of other endocrine, nutritional and metabolic disease: Secondary | ICD-10-CM | POA: Diagnosis not present

## 2014-02-15 DIAGNOSIS — Z125 Encounter for screening for malignant neoplasm of prostate: Secondary | ICD-10-CM

## 2014-02-15 NOTE — Progress Notes (Signed)
Pt here for lab draw and testosterone shot .

## 2014-02-16 LAB — COMPREHENSIVE METABOLIC PANEL
ALBUMIN: 4.3 g/dL (ref 3.5–5.2)
ALK PHOS: 82 U/L (ref 39–117)
ALT: 33 U/L (ref 0–53)
AST: 33 U/L (ref 0–37)
BILIRUBIN TOTAL: 0.7 mg/dL (ref 0.2–1.2)
BUN: 20 mg/dL (ref 6–23)
CO2: 24 mEq/L (ref 19–32)
Calcium: 9.2 mg/dL (ref 8.4–10.5)
Chloride: 102 mEq/L (ref 96–112)
Creat: 1.69 mg/dL — ABNORMAL HIGH (ref 0.50–1.35)
GLUCOSE: 86 mg/dL (ref 70–99)
Potassium: 3.8 mEq/L (ref 3.5–5.3)
Sodium: 140 mEq/L (ref 135–145)
Total Protein: 7.5 g/dL (ref 6.0–8.3)

## 2014-02-17 LAB — TESTOSTERONE, FREE, TOTAL, SHBG
SEX HORMONE BINDING: 24 nmol/L (ref 13–71)
TESTOSTERONE: 502 ng/dL (ref 300–890)
Testosterone, Free: 124.8 pg/mL (ref 47.0–244.0)
Testosterone-% Free: 2.5 % (ref 1.6–2.9)

## 2014-02-17 LAB — PSA: PSA: 3.42 ng/mL (ref <=4.00)

## 2014-02-28 ENCOUNTER — Ambulatory Visit (INDEPENDENT_AMBULATORY_CARE_PROVIDER_SITE_OTHER): Payer: Medicare Other | Admitting: Family Medicine

## 2014-02-28 ENCOUNTER — Telehealth: Payer: Self-pay | Admitting: Family Medicine

## 2014-02-28 DIAGNOSIS — E291 Testicular hypofunction: Secondary | ICD-10-CM | POA: Diagnosis not present

## 2014-02-28 DIAGNOSIS — N289 Disorder of kidney and ureter, unspecified: Secondary | ICD-10-CM

## 2014-02-28 NOTE — Telephone Encounter (Signed)
Spoke to pt in office, he does want to have the renal US.  At what point does less functioning mean his kidney function stops?

## 2014-03-01 NOTE — Telephone Encounter (Signed)
LMVM to CB.  Put in order for renal US.

## 2014-03-01 NOTE — Telephone Encounter (Signed)
Please schedule a renal ultrasound. Typically with kidney dysfunction it is a slow decline over functioning of the kidney that may take 5-10 years. We follow this by routine blood work .

## 2014-03-03 NOTE — Telephone Encounter (Signed)
LMVM to CB. 

## 2014-03-06 NOTE — Telephone Encounter (Signed)
LMVM to CB. 

## 2014-03-07 ENCOUNTER — Ambulatory Visit (INDEPENDENT_AMBULATORY_CARE_PROVIDER_SITE_OTHER): Payer: Medicare Other | Admitting: Physician Assistant

## 2014-03-07 DIAGNOSIS — E291 Testicular hypofunction: Secondary | ICD-10-CM

## 2014-03-07 NOTE — Progress Notes (Signed)
   Subjective:    Patient ID: [name], male    DOB: 02-04-1941, 73 y.o.   MRN: [id number]  HPI here for testosterone injection 100 mg every 7 days. Last given 02/28/2014    Review of Systems     Objective:   Physical Exam        Assessment & Plan:  Testosterone injection 100 mg (0.5 ML) given

## 2014-03-07 NOTE — Telephone Encounter (Signed)
LMVM to CB. 

## 2014-03-08 NOTE — Telephone Encounter (Signed)
LMVM to CB. 

## 2014-03-08 NOTE — Telephone Encounter (Signed)
Spoke to patient.  Gave him message that renal US was being scheduled and they would call him with appointment time and date.  Also that kidney fx is a slow decline and can take up to 5-10 years to show up and that Dr. Everlene Farrier said he would monitor it with blood work. Patient verbalized understanding and was very grateful for phone call.

## 2014-03-10 ENCOUNTER — Telehealth: Payer: Self-pay | Admitting: *Deleted

## 2014-03-10 NOTE — Telephone Encounter (Signed)
Patient called to inquire if we have received a letter from Dr. Everlene Farrier stating that he had been in compression hose prior to Dr.Early's appt on 01-10-14. He states that he the silvadene tx and Ace wraps were helping for a period of time but the wound is not getting any better. He is still having pain and is having trouble sleeping. He says that he is afebrile but is having some odor coming from his wound. He states that "he will just drop in to the office and be seen on Monday morning before he goes out of town on Monday afternoon". I told him that we would like him to make an appt to be seen and that Dr. Donnetta Hutching was not in the office on Monday. I gave him Sonya's direct number and asked him to call us before coming over. He got very agitated when I told him this and said he may just go to the ED or urgent care this weekend because of his leg pain. I instructed him to go to a [facility] facility so that they would be able to see all documentation and vascular studies in [facility]. He said he would think about it and may call Sonya on Monday. Patient voiced understanding but couldn't understand why we would not just let him come by and drop in on Monday morning.

## 2014-03-10 NOTE — Telephone Encounter (Signed)
Message copied by Mena Goes on Fri Mar 10, 2014  3:57 PM ------      Message from: Merleen Nicely      Created: Fri Mar 10, 2014  3:27 PM      Regarding: pt. in pain       Contact: [phone]       Patient is in pain. Wants to be worked in on Monday. Please call.            Thanks      Ebony Hail ------

## 2014-03-13 ENCOUNTER — Ambulatory Visit (INDEPENDENT_AMBULATORY_CARE_PROVIDER_SITE_OTHER): Payer: Medicare Other | Admitting: Emergency Medicine

## 2014-03-13 ENCOUNTER — Encounter: Payer: Self-pay | Admitting: *Deleted

## 2014-03-13 VITALS — BP 144/80 | HR 59 | Temp 97.5°F | Resp 16 | Ht 68.5 in | Wt 230.0 lb

## 2014-03-13 DIAGNOSIS — S81809A Unspecified open wound, unspecified lower leg, initial encounter: Secondary | ICD-10-CM

## 2014-03-13 DIAGNOSIS — S81009A Unspecified open wound, unspecified knee, initial encounter: Secondary | ICD-10-CM

## 2014-03-13 DIAGNOSIS — S81801A Unspecified open wound, right lower leg, initial encounter: Secondary | ICD-10-CM

## 2014-03-13 DIAGNOSIS — S91009A Unspecified open wound, unspecified ankle, initial encounter: Secondary | ICD-10-CM

## 2014-03-13 MED ORDER — DOXYCYCLINE HYCLATE 100 MG PO TABS: 100.0000 mg | ORAL_TABLET | Freq: Two times a day (BID) | ORAL | Status: DC

## 2014-03-13 NOTE — Progress Notes (Signed)
   Subjective:    Patient ID: [name], male    DOB: Feb 04, 1941, 73 y.o.   MRN: [id number]  HPI  73 y.o. Male presents to clinic today for right leg pain. Has concerns about a sore on right leg that doesn't seem to be getting any better.States that sore has been persistent since January. Reports that it just doesn't seem to be getting any better. States that he changes dressing every day or to. Sees vein doctor that he reports gave a zinc ointment to put on this ulcer. He states he has been wearing his support stockings every day since January.   Review of Systems     Objective:   Physical Exam There is a 2 cm deep ulcer or over the right mid shin. There is surrounding redness and tenderness around the ulceration. There is a purulent odor to the exudate.       Assessment & Plan:  Patient with a 2 cm nonhealing ulcer anterior right shin. Referral made to the wound center culture was done. He will recheck here in one week with Dr. Laney Hanson. He will be on doxycycline and Xeroform application.

## 2014-03-13 NOTE — Patient Instructions (Signed)
Please see Dr. Martin Majestic in 1 week. A referral has been made to the wound center.

## 2014-03-15 LAB — WOUND CULTURE
GRAM STAIN: NONE SEEN
GRAM STAIN: NONE SEEN
ORGANISM ID, BACTERIA: NORMAL

## 2014-03-16 ENCOUNTER — Encounter: Payer: Self-pay | Admitting: Surgery

## 2014-03-16 ENCOUNTER — Telehealth: Payer: Self-pay | Admitting: *Deleted

## 2014-03-16 DIAGNOSIS — I87319 Chronic venous hypertension (idiopathic) with ulcer of unspecified lower extremity: Secondary | ICD-10-CM | POA: Diagnosis not present

## 2014-03-16 NOTE — Telephone Encounter (Signed)
The wound culture showed normal skin flora.  Please fax to the wound center and let the patient know.

## 2014-03-16 NOTE — Telephone Encounter (Signed)
Spoke with pt , results were faxed

## 2014-03-16 NOTE — Telephone Encounter (Signed)
Called and left message on patients phone regarding culture. Left msg for him to call us back and let us know which would care center he was at .

## 2014-03-16 NOTE — Telephone Encounter (Signed)
Patient called regarding wound culture done on Monday by Dr. Everlene Farrier. Patient states that he is at wound care center at [facility] today and was curious as to the results could you review this please.

## 2014-03-16 NOTE — Telephone Encounter (Signed)
Results have been faxed

## 2014-03-16 NOTE — Telephone Encounter (Signed)
Patient called back but didn't specify which amy. Amy L said to take message.  The wound care center at [facility] needs his culture labs to treat him now (he is currently there) please fax that to [phone]

## 2014-03-21 ENCOUNTER — Ambulatory Visit (INDEPENDENT_AMBULATORY_CARE_PROVIDER_SITE_OTHER): Payer: Medicare Other

## 2014-03-21 DIAGNOSIS — E291 Testicular hypofunction: Secondary | ICD-10-CM | POA: Diagnosis not present

## 2014-03-21 DIAGNOSIS — E349 Endocrine disorder, unspecified: Secondary | ICD-10-CM

## 2014-03-21 MED ORDER — TESTOSTERONE CYPIONATE 100 MG/ML IM SOLN: 200.0000 mg | Freq: Once | INTRAMUSCULAR | Status: DC

## 2014-03-25 ENCOUNTER — Other Ambulatory Visit: Payer: Self-pay | Admitting: Emergency Medicine

## 2014-03-25 NOTE — Telephone Encounter (Signed)
PT IS VERY ANXIOUS TO GET HIS MEDS REFILLED

## 2014-04-10 ENCOUNTER — Encounter: Payer: Self-pay | Admitting: Surgery

## 2014-04-10 ENCOUNTER — Ambulatory Visit (INDEPENDENT_AMBULATORY_CARE_PROVIDER_SITE_OTHER): Payer: Medicare Other | Admitting: Emergency Medicine

## 2014-04-10 VITALS — BP 110/88 | HR 62 | Temp 97.6°F | Resp 18 | Ht 69.0 in | Wt 233.8 lb

## 2014-04-10 DIAGNOSIS — M549 Dorsalgia, unspecified: Secondary | ICD-10-CM

## 2014-04-10 DIAGNOSIS — S81801A Unspecified open wound, right lower leg, initial encounter: Secondary | ICD-10-CM

## 2014-04-10 DIAGNOSIS — S81809A Unspecified open wound, unspecified lower leg, initial encounter: Secondary | ICD-10-CM

## 2014-04-10 DIAGNOSIS — E349 Endocrine disorder, unspecified: Secondary | ICD-10-CM

## 2014-04-10 DIAGNOSIS — S91009A Unspecified open wound, unspecified ankle, initial encounter: Secondary | ICD-10-CM

## 2014-04-10 DIAGNOSIS — S81009A Unspecified open wound, unspecified knee, initial encounter: Secondary | ICD-10-CM

## 2014-04-10 DIAGNOSIS — E291 Testicular hypofunction: Secondary | ICD-10-CM | POA: Diagnosis not present

## 2014-04-10 MED ORDER — DOXYCYCLINE HYCLATE 100 MG PO TABS: 100.0000 mg | ORAL_TABLET | Freq: Two times a day (BID) | ORAL | Status: DC

## 2014-04-10 MED ORDER — TESTOSTERONE CYPIONATE 100 MG/ML IM SOLN
200.0000 mg | Freq: Once | INTRAMUSCULAR | Status: AC
  Administered 2014-04-10: 200 mg via INTRAMUSCULAR

## 2014-04-10 MED ORDER — SILDENAFIL CITRATE 100 MG PO TABS: 50.0000 mg | ORAL_TABLET | Freq: Every day | ORAL | Status: DC | PRN

## 2014-04-10 MED ORDER — HYDROCODONE-ACETAMINOPHEN 5-325 MG PO TABS: ORAL_TABLET | ORAL | Status: DC

## 2014-04-10 NOTE — Progress Notes (Addendum)
This chart was scribed for [name], MD by Einar Pheasant, ED Scribe. This patient was seen in room 9 and the patient's care was started at 10:33 AM.  Subjective:    Patient ID: [name], male    DOB: 1941/02/27, 73 y.o.   MRN: [id number]  Chief Complaint  Patient presents with  . Follow-up    on leg  . Injections    testosterone injection  . Medication Refill    hydrocodone    HPI HPI Comments: [name] is a 73 y.o. male who presents to the [facility] for a follow up on his right left. He was seen by me on 03/13/14 complaining of right leg pain. He was referred to the wound center and a culture was done. He was prescribed Doxycycline and Xeroform application. Today, pt states that he is doing well. He states that he is being seen at the wound center. Pt states that he ran out of his antibiotics and would like to renew them. He states that last night he was experiencing a "pinching" pain in his right leg  Pt is also here to received his testosterone injection. He would also like a refill on his hydrocodone, which he takes for his leg and back. States that he takes about 2 pills a day.   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   Patient Active Problem List   Diagnosis Date Noted  . Ulcer of lower limb, unspecified 01/10/2014  . Varicose veins of lower extremities with other complications 29/51/8841  . 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 Renal insufficiency 12/21/2013  . Swelling in head/neck 07/28/2012  . 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 Pain in limb 02/12/2012  . HYPERLIPIDEMIA 02/28/2008  . ALLERGIC RHINITIS 02/28/2008  . SLEEP APNEA 02/28/2008   Past Medical History  Diagnosis Date  . Arthritis   . 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 Sleep apnea   . Hx of [facility] spotted  fever childhood  . Allergy   . Depression   . Neuromuscular disorder   . Anxiety    Past Surgical History  Procedure Laterality Date  . Epidural steroid injections      every 6 months   Dr. Arlice Colt  . Hernia repair  2003  . Back surgery  2003    L4, L5   No Known Allergies Prior to Admission medications   Medication Sig Start Date End Date Taking? Authorizing Provider  amoxicillin (AMOXIL) 500 MG capsule  12/19/13  Yes Historical Provider, MD  aspirin 81 MG tablet Take 81 mg by mouth daily.   Yes Historical Provider, MD  buPROPion (WELLBUTRIN XL) 300 MG 24 hr tablet TAKE 1 TABLET BY MOUTH EVERY MORNING 11/23/13  Yes [name], MD  calcium carbonate 200 MG capsule Take 250 mg by mouth daily.   Yes Historical Provider, MD  cetirizine (ZYRTEC) 10 MG chewable tablet Chew 10 mg by mouth daily.   Yes Historical Provider, MD  Diphenhyd-Hydrocort-Nystatin (FIRST-DUKES MOUTHWASH) SUSP 1 teaspoon his rinse gargle and spit 4 times a day 07/30/12  Yes [name], MD  doxazosin (CARDURA) 8 MG tablet TAKE 1 TABLET BY MOUTH ATBEDTIME   Yes Chelle S Jeffery, PA-C  doxycycline (VIBRA-TABS) 100 MG tablet Take 1 tablet (100 mg total) by mouth 2 (  two) times daily. 03/13/14  Yes [name], MD  esomeprazole (NEXIUM) 20 MG packet Take 20 mg by mouth daily before breakfast.   Yes Historical Provider, MD  flurazepam (DALMANE) 15 MG capsule Take 1 capsule (15 mg total) by mouth at bedtime as needed for sleep. 08/12/12  Yes [name], MD  fluticasone (FLONASE) 50 MCG/ACT nasal spray Place 2 sprays into the nose daily. 07/28/12  Yes Lyndal Pulley, DO  furosemide (LASIX) 40 MG tablet TAKE 1 TABLET BY MOUTH ONCE A DAY 04/25/13  Yes [name], MD  gabapentin (NEURONTIN) 300 MG capsule Take 300 mg by mouth 3 (three) times daily.   Yes Historical Provider, MD  glucosamine-chondroitin 500-400 MG tablet Take 1 tablet by mouth 3 (three) times daily.   Yes Historical Provider, MD    HYDROcodone-acetaminophen (NORCO/VICODIN) 5-325 MG per tablet Take 1 tablet by mouth every 8 (eight) hours as needed. 02/11/14  Yes [name], MD  LORazepam (ATIVAN) 1 MG tablet Take one tablet bid prn anxiety 12/06/13  Yes [name], MD  meloxicam (MOBIC) 15 MG tablet Take 15 mg by mouth daily.   Yes Historical Provider, MD  methocarbamol (ROBAXIN) 500 MG tablet Take 500 mg by mouth as needed for muscle spasms.   Yes Historical Provider, MD  metolazone (ZAROXOLYN) 2.5 MG tablet Take 1 tablet (2.5 mg total) by mouth daily. 04/25/13  Yes [name], MD  Multiple Vitamin (MULTIVITAMIN) tablet Take 1 tablet by mouth daily.   Yes Historical Provider, MD  mupirocin ointment (BACTROBAN) 2 % Apply topically 2 (two) times daily. PATIENT NEEDS OFFICE VISIT FOR ADDITIONAL REFILLS 12/02/13  Yes Mancel Bale, PA-C  omeprazole (PRILOSEC) 40 MG capsule Take 40 mg by mouth daily.   Yes Historical Provider, MD  potassium chloride SA (K-DUR,KLOR-CON) 20 MEQ tablet Take 1 tablet (20 mEq total) by mouth 2 (two) times daily. 04/25/13  Yes [name], MD  testosterone cypionate (DEPOTESTOTERONE CYPIONATE) 200 MG/ML injection Inject 0.5 mLs (100 mg total) into the muscle every 7 (seven) days. 02/11/14  Yes [name], MD  valACYclovir (VALTREX) 1000 MG tablet Take 1 tablet (1,000 mg total) by mouth 2 (two) times daily. 07/30/12  Yes [name], MD   History   Social History  . Marital Status: Single    Spouse Name: N/A    Number of Children: N/A  . Years of Education: N/A   Occupational History  . Antiques/Real Estate    Social History Main Topics  . Smoking status: Never Smoker   . Smokeless tobacco: Never Used  . Alcohol Use: No  . Drug Use: No  . Sexual Activity: Yes    Birth Control/ Protection: Condom   Other Topics Concern  . Not on file   Social History Narrative   Single. Education: Other.    Review of Systems  Constitutional: Negative for fatigue and unexpected weight  change.  Eyes: Negative for visual disturbance.  Respiratory: Negative for cough, chest tightness and shortness of breath.   Cardiovascular: Negative for chest pain, palpitations and leg swelling.  Gastrointestinal: Negative for abdominal pain and blood in stool.  Musculoskeletal: Positive for arthralgias (right leg).  Neurological: Negative for dizziness, light-headedness and headaches.   Objective:   Physical Exam CONSTITUTIONAL: Well developed/well nourished HEAD: Normocephalic/atraumatic EYES: EOMI/PERRL ENMT: Mucous membranes moist NECK: supple no meningeal signs SPINE:entire spine nontender CV: S1/S2 noted, no murmurs/rubs/gallops noted LUNGS: Lungs are clear to auscultation bilaterally, no  apparent distress ABDOMEN: soft, nontender, no rebound or guarding GU:no cva tenderness NEURO: Pt is awake/alert, moves all extremitiesx4 EXTREMITIES: pulses normal, full ROM SKIN: warm, color normal; He has a 2 cm open ulceration over the right mid tibia. There is skin discoloration around the ulceration. His compression dressing is on. He has bilateral severe varicose veins.  PSYCH: no abnormalities of mood noted   Filed Vitals:   04/10/14 1003  BP: 110/88  Pulse: 62  Temp: 97.6 F (36.4 C)  TempSrc: Oral  Resp: 18  Height: 5\' 9"  (1.753 m)  Weight: 233 lb 12.8 oz (106.051 kg)  SpO2: 95%    Assessment & Plan:  Medical status is about the same. He still has a nonhealing wound over the right mid shin. This was cleaned with soap followed by application of Xeroform. His medications including the antibiotics were refilled. His antibodies for refill of his pain medications were refilled and he was given a prescription for Viagra.  I personally performed the services described in this documentation, which was scribed in my presence. The recorded information has been reviewed and is accurate.

## 2014-04-12 ENCOUNTER — Telehealth: Payer: Self-pay

## 2014-04-12 NOTE — Telephone Encounter (Signed)
Pharm faxed request for pt to try the generic Viagra which is not covered by ins, but cheaper for pt to pay OOP. I have pended it for equvalent of 1 tab dosing, but you may want to write the equv of 1/2 tab - 1 tab and I don't know how many you want to give pt since not running through ins.

## 2014-04-13 ENCOUNTER — Other Ambulatory Visit: Payer: Self-pay | Admitting: Emergency Medicine

## 2014-04-13 ENCOUNTER — Other Ambulatory Visit: Payer: Self-pay | Admitting: Radiology

## 2014-04-13 DIAGNOSIS — N529 Male erectile dysfunction, unspecified: Secondary | ICD-10-CM

## 2014-04-13 MED ORDER — SILDENAFIL CITRATE 20 MG PO TABS: ORAL_TABLET | ORAL | Status: DC

## 2014-04-13 NOTE — Telephone Encounter (Signed)
I have printed a prescription. It is at [address]. We can fax it or give it to him

## 2014-04-13 NOTE — Telephone Encounter (Signed)
I resent this electronically.

## 2014-04-17 ENCOUNTER — Encounter: Payer: Self-pay | Admitting: Vascular Surgery

## 2014-04-18 ENCOUNTER — Telehealth: Payer: Self-pay

## 2014-04-18 ENCOUNTER — Ambulatory Visit (INDEPENDENT_AMBULATORY_CARE_PROVIDER_SITE_OTHER): Payer: Medicare Other | Admitting: Vascular Surgery

## 2014-04-18 ENCOUNTER — Encounter: Payer: Self-pay | Admitting: Vascular Surgery

## 2014-04-18 VITALS — BP 136/76 | HR 77 | Resp 18 | Ht 71.0 in | Wt 233.0 lb

## 2014-04-18 DIAGNOSIS — L97909 Non-pressure chronic ulcer of unspecified part of unspecified lower leg with unspecified severity: Secondary | ICD-10-CM

## 2014-04-18 DIAGNOSIS — I83893 Varicose veins of bilateral lower extremities with other complications: Secondary | ICD-10-CM | POA: Diagnosis not present

## 2014-04-18 DIAGNOSIS — M549 Dorsalgia, unspecified: Secondary | ICD-10-CM

## 2014-04-18 NOTE — Telephone Encounter (Signed)
Please call patient tell him I will have to take care of this tomorrow when I am at 102 not tonight.

## 2014-04-18 NOTE — Progress Notes (Signed)
Here today for continued followup of his right leg venous hypertension. He has had initial worsening of his pretibial venous ulcer. He has undergone a course of oral antibiotics and was also seen in the wound center [facility]. This has improved with some increase her compression. He does continue to have pain associated with this and does have marked swelling and very slowly healing venous ulcer.  Past Medical History  Diagnosis Date  . Arthritis   . 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 Sleep apnea   . Hx of [facility] spotted fever childhood  . Allergy   . Depression   . Neuromuscular disorder   . Anxiety     History  Substance Use Topics  . Smoking status: Never Smoker   . Smokeless tobacco: Never Used  . Alcohol Use: No    Family History  Problem Relation Age of Onset  . Thyroid disease Mother   . Heart disease Father   . Diabetes Maternal Grandfather     No Known Allergies  Current outpatient prescriptions:amoxicillin (AMOXIL) 500 MG capsule, , Disp: , Rfl: ;  aspirin 81 MG tablet, Take 81 mg by mouth daily., Disp: , Rfl: ;  buPROPion (WELLBUTRIN XL) 300 MG 24 hr tablet, TAKE 1 TABLET BY MOUTH EVERY MORNING, Disp: 90 tablet, Rfl: 3;  calcium carbonate 200 MG capsule, Take 250 mg by mouth daily., Disp: , Rfl: ;  cetirizine (ZYRTEC) 10 MG chewable tablet, Chew 10 mg by mouth daily., Disp: , Rfl:  Diphenhyd-Hydrocort-Nystatin (FIRST-DUKES MOUTHWASH) SUSP, 1 teaspoon his rinse gargle and spit 4 times a day, Disp: 120 mL, Rfl: 1;  doxazosin (CARDURA) 8 MG tablet, TAKE 1 TABLET BY MOUTH ATBEDTIME, Disp: 90 tablet, Rfl: 0;  doxycycline (VIBRA-TABS) 100 MG tablet, Take 1 tablet (100 mg total) by mouth 2 (two) times daily., Disp: 20 tablet, Rfl: 0;  esomeprazole (NEXIUM) 20 MG packet, Take 20 mg by mouth daily before breakfast., Disp: , Rfl:  flurazepam (DALMANE) 15 MG capsule, Take 1 capsule (15 mg total) by mouth at bedtime as needed for sleep., Disp: 30  capsule, Rfl: 5;  fluticasone (FLONASE) 50 MCG/ACT nasal spray, Place 2 sprays into the nose daily., Disp: 16 g, Rfl: 6;  furosemide (LASIX) 40 MG tablet, TAKE 1 TABLET BY MOUTH ONCE A DAY, Disp: 90 tablet, Rfl: 3;  gabapentin (NEURONTIN) 300 MG capsule, Take 300 mg by mouth 3 (three) times daily., Disp: , Rfl:  glucosamine-chondroitin 500-400 MG tablet, Take 1 tablet by mouth 3 (three) times daily., Disp: , Rfl: ;  HYDROcodone-acetaminophen (NORCO/VICODIN) 5-325 MG per tablet, Take 1-2 tablets per day as needed for back or leg pain., Disp: 60 tablet, Rfl: 0;  LORazepam (ATIVAN) 1 MG tablet, Take one tablet bid prn anxiety, Disp: 90 tablet, Rfl: 1;  meloxicam (MOBIC) 15 MG tablet, Take 15 mg by mouth daily., Disp: , Rfl:  methocarbamol (ROBAXIN) 500 MG tablet, Take 500 mg by mouth as needed for muscle spasms., Disp: , Rfl: ;  metolazone (ZAROXOLYN) 2.5 MG tablet, Take 1 tablet (2.5 mg total) by mouth daily., Disp: 90 tablet, Rfl: 3;  Multiple Vitamin (MULTIVITAMIN) tablet, Take 1 tablet by mouth daily., Disp: , Rfl: ;  mupirocin ointment (BACTROBAN) 2 %, Apply topically 2 (two) times daily. PATIENT NEEDS OFFICE VISIT FOR ADDITIONAL REFILLS, Disp: 22 g, Rfl: 0 omeprazole (PRILOSEC) 40 MG capsule, Take 40 mg by mouth daily., Disp: , Rfl: ;  potassium chloride SA (K-DUR,KLOR-CON) 20 MEQ tablet,  Take 1 tablet (20 mEq total) by mouth 2 (two) times daily., Disp: 180 tablet, Rfl: 3;  sildenafil (REVATIO) 20 MG tablet, Take 5 tablets daily as needed for erectile dysfunction., Disp: 50 tablet, Rfl: 11;  sildenafil (REVATIO) 20 MG tablet, Take 3 tablets one hour prior to intercourse, Disp: 30 tablet, Rfl: 2 sildenafil (VIAGRA) 100 MG tablet, Take 0.5-1 tablets (50-100 mg total) by mouth daily as needed for erectile dysfunction., Disp: 5 tablet, Rfl: 11;  testosterone cypionate (DEPOTESTOTERONE CYPIONATE) 200 MG/ML injection, Inject 0.5 mLs (100 mg total) into the muscle every 7 (seven) days., Disp: 10 mL, Rfl: 0;   valACYclovir (VALTREX) 1000 MG tablet, Take 1 tablet (1,000 mg total) by mouth 2 (two) times daily., Disp: 20 tablet, Rfl: 0 Current facility-administered medications:testosterone cypionate (DEPOTESTOTERONE CYPIONATE) injection 100 mg, 100 mg, Intramuscular, Weekly, [place] Donovan, PA-C, 100 mg at 01/10/14 1925;  testosterone cypionate (DEPOTESTOTERONE CYPIONATE) injection 100 mg, 100 mg, Intramuscular, Weekly, Wardell Honour, MD, 100 mg at 03/21/14 1949 testosterone cypionate (DEPOTESTOTERONE CYPIONATE) injection 200 mg, 200 mg, Intramuscular, Weekly, [name], MD, 200 mg at 12/26/13 1427  BP 136/76  Pulse 77  Resp 18  Ht 5\' 11"  (1.803 m)  Wt 233 lb (105.688 kg)  BMI 32.51 kg/m2  Body mass index is 32.51 kg/(m^2).       Physical exam he does have palpable dorsalis pedis pulses bilaterally He hasn't no significant swelling in his left leg. Marked pitting edema in his right leg from his knee distally. He has a 2-3 cm superficial clean ulceration over the pretibial area on his distal pretibial region.  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 Have recommended ablation. I did reimage this with SonoSite ultrasound today and this does show a dilated great saphenous vein reflux. I explained the procedure of laser ablation as an outpatient under local anesthesia. Explained the slight risk of DVT with the procedure. He wishes to proceed as soon as possible
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
## 2014-04-18 NOTE — Telephone Encounter (Signed)
LMOM for pt w/Dr Daub's info.

## 2014-04-18 NOTE — Telephone Encounter (Signed)
Spoke to pt to let him know dr Everlene Farrier is not in, however he will be in tomorrow and will be happy to refill these medication tomorrow.  [name] was rude stating, 'well he was at 43 today". At this point I explained that I was sorry, however Dr Everlene Farrier will be happy to take care of this tomorrow.  Pt stated he already planned his whole day around waiting for this refill, but that's ok he guessed he will just wait until tomorrow.  This is not the first time he has been rude. I  just wanted to make you aware of this conversation.
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
## 2014-04-19 ENCOUNTER — Other Ambulatory Visit: Payer: Self-pay | Admitting: Emergency Medicine

## 2014-04-19 DIAGNOSIS — M549 Dorsalgia, unspecified: Secondary | ICD-10-CM

## 2014-04-19 MED ORDER — HYDROCODONE-ACETAMINOPHEN 5-325 MG PO TABS: ORAL_TABLET | ORAL | Status: DC

## 2014-04-19 MED ORDER — DOXYCYCLINE HYCLATE 100 MG PO TABS: 100.0000 mg | ORAL_TABLET | Freq: Two times a day (BID) | ORAL | Status: DC

## 2014-04-19 NOTE — Addendum Note (Signed)
Addended by: Elwyn Reach A on: 04/19/2014 09:26 AM   Modules accepted: Orders
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
## 2014-04-19 NOTE — Telephone Encounter (Signed)
Notified pt hydrocodone Rx ready to p/up. Resent Abx to [facility].

## 2014-04-21 ENCOUNTER — Ambulatory Visit: Payer: Medicare Other | Admitting: *Deleted

## 2014-04-25 ENCOUNTER — Encounter: Payer: Self-pay | Admitting: Vascular Surgery

## 2014-04-26 ENCOUNTER — Ambulatory Visit: Payer: Medicare Other | Admitting: Vascular Surgery

## 2014-04-26 ENCOUNTER — Other Ambulatory Visit: Payer: Self-pay | Admitting: *Deleted

## 2014-04-26 DIAGNOSIS — L97909 Non-pressure chronic ulcer of unspecified part of unspecified lower leg with unspecified severity: Secondary | ICD-10-CM

## 2014-04-26 DIAGNOSIS — I83893 Varicose veins of bilateral lower extremities with other complications: Secondary | ICD-10-CM

## 2014-05-01 ENCOUNTER — Ambulatory Visit (INDEPENDENT_AMBULATORY_CARE_PROVIDER_SITE_OTHER): Payer: Medicare Other | Admitting: Radiology

## 2014-05-01 DIAGNOSIS — E349 Endocrine disorder, unspecified: Secondary | ICD-10-CM

## 2014-05-01 DIAGNOSIS — E291 Testicular hypofunction: Secondary | ICD-10-CM

## 2014-05-10 ENCOUNTER — Other Ambulatory Visit: Payer: Self-pay | Admitting: Internal Medicine

## 2014-05-10 ENCOUNTER — Other Ambulatory Visit: Payer: Self-pay | Admitting: Physician Assistant

## 2014-05-10 ENCOUNTER — Ambulatory Visit (INDEPENDENT_AMBULATORY_CARE_PROVIDER_SITE_OTHER): Payer: Medicare Other | Admitting: *Deleted

## 2014-05-10 DIAGNOSIS — E291 Testicular hypofunction: Secondary | ICD-10-CM | POA: Diagnosis not present

## 2014-05-10 DIAGNOSIS — E349 Endocrine disorder, unspecified: Secondary | ICD-10-CM

## 2014-05-10 NOTE — Telephone Encounter (Signed)
Heather ran [facility] and no one has filled the missing Rx written 04/10/14. I called and LMOM for Officer Jones and advised he can CB if he would like me to fax him the report which I have kept in my alph pending folder.

## 2014-05-19 ENCOUNTER — Ambulatory Visit (INDEPENDENT_AMBULATORY_CARE_PROVIDER_SITE_OTHER): Payer: Medicare Other | Admitting: Emergency Medicine

## 2014-05-19 DIAGNOSIS — M546 Pain in thoracic spine: Secondary | ICD-10-CM

## 2014-05-19 MED ORDER — HYDROCODONE-ACETAMINOPHEN 5-325 MG PO TABS: ORAL_TABLET | ORAL | Status: DC

## 2014-05-19 NOTE — Progress Notes (Signed)
   Subjective:    Patient ID: [name], male    DOB: November 06, 1941, 73 y.o.   MRN: [id number]  HPI Pt here for his testosterone shot and asked for a refill on his Hydrocodone. Dr. Everlene Farrier asked to have this printed for him. Signed and given to pt.   Review of Systems     Objective:   Physical Exam        Assessment & Plan:  Patient not examined. He was given his testosterone shot and a prescription for his hydrocodone.

## 2014-05-31 ENCOUNTER — Encounter: Payer: Self-pay | Admitting: Vascular Surgery

## 2014-06-01 ENCOUNTER — Encounter: Payer: Self-pay | Admitting: Vascular Surgery

## 2014-06-01 ENCOUNTER — Ambulatory Visit (INDEPENDENT_AMBULATORY_CARE_PROVIDER_SITE_OTHER): Payer: Medicare Other | Admitting: Vascular Surgery

## 2014-06-01 VITALS — BP 133/72 | HR 73 | Resp 16 | Ht 70.5 in | Wt 220.0 lb

## 2014-06-01 DIAGNOSIS — I83893 Varicose veins of bilateral lower extremities with other complications: Secondary | ICD-10-CM

## 2014-06-01 DIAGNOSIS — L97909 Non-pressure chronic ulcer of unspecified part of unspecified lower leg with unspecified severity: Secondary | ICD-10-CM

## 2014-06-01 HISTORY — PX: ENDOVENOUS ABLATION SAPHENOUS VEIN W/ LASER: SUR449

## 2014-06-01 NOTE — Progress Notes (Signed)
   Laser Ablation Procedure      Date: 06/01/2014    [name] DOB:1940-11-28  Consent signed: Yes  Surgeon:T.F. Makailee Nudelman  Procedure: Laser Ablation: right Greater Saphenous Vein  BP 133/72  Pulse 73  Resp 16  Ht 5' 10.5" (1.791 m)  Wt 220 lb (99.791 kg)  BMI 31.11 kg/m2  Start time: 11:00am   End time: 12:00pm  Tumescent Anesthesia: 450 cc 0.9% NaCl with 50 cc Lidocaine HCL with 1% Epi and 15 cc 8.4% NaHCO3  Local Anesthesia: 5 cc Lidocaine HCL and NaHCO3 (ratio 2:1)  Continuous Mode: 15 Watts Total Energy 2873 Joules Total Time3:11       Patient tolerated procedure well: Yes    Description of Procedure:  After marking the course of the saphenous vein and the secondary varicosities in the standing position, the patient was placed on the operating table in the supine position, and the right leg was prepped and draped in sterile fashion. Local anesthetic was administered, and under ultrasound guidance the saphenous vein was accessed with a micro needle and guide wire; then the micro puncture sheath was placed. A guide wire was inserted to the saphenofemoral junction, followed by a 5 french sheath.  The position of the sheath and then the laser fiber below the junction was confirmed using the ultrasound and visualization of the aiming beam.  Tumescent anesthesia was administered along the course of the saphenous vein using ultrasound guidance. Protective laser glasses were placed on the patient, and the laser was fired at at 15 watt continuous mode.  For a total of 2873 joules.  A steri strip was applied to the puncture site.   ABD pads and thigh high compression stockings were applied.  Ace wrap bandages were applied  at the top of the saphenofemoral junction.  Blood loss was less than 15 cc.  The patient ambulated out of the operating room having tolerated the procedure well.

## 2014-06-05 ENCOUNTER — Telehealth: Payer: Self-pay | Admitting: *Deleted

## 2014-06-05 NOTE — Telephone Encounter (Signed)
    06/05/2014  Time: 4:51 PM   Patient Name: [name]  Patient of: T.F. Early  Procedure:Laser Ablation right greater saphenous vein 06-01-2014  Reached patient at home and checked  His status  Yes    Comments/Actions Taken: Mr. Gaertner states no problems with pain or swelling.  States he is having difficulty with compression hose staying up on his thigh (rolling down).  Advised after the compression dressing is removed on Saturday, to use Spanx like garment over the compression hose to prevent roll down.  Reviewed post procedural instructions and reminded him of post laser ablation duplex and V[facility] Fu with Dr. Donnetta Hutching on 06-08-2014.        @SIGNATURE @

## 2014-06-06 ENCOUNTER — Ambulatory Visit (INDEPENDENT_AMBULATORY_CARE_PROVIDER_SITE_OTHER): Payer: Medicare Other

## 2014-06-06 DIAGNOSIS — E291 Testicular hypofunction: Secondary | ICD-10-CM

## 2014-06-06 DIAGNOSIS — E349 Endocrine disorder, unspecified: Secondary | ICD-10-CM

## 2014-06-06 MED ORDER — TESTOSTERONE CYPIONATE 200 MG/ML IM SOLN
200.0000 mg | Freq: Once | INTRAMUSCULAR | Status: AC
  Administered 2014-06-06: 200 mg via INTRAMUSCULAR

## 2014-06-07 ENCOUNTER — Encounter: Payer: Self-pay | Admitting: Vascular Surgery

## 2014-06-08 ENCOUNTER — Telehealth: Payer: Self-pay | Admitting: Family Medicine

## 2014-06-08 ENCOUNTER — Ambulatory Visit (HOSPITAL_COMMUNITY)
Admission: RE | Admit: 2014-06-08 | Discharge: 2014-06-08 | Disposition: A | Discharge status: Home / Self Care | Payer: Medicare Other | Source: Ambulatory Visit | Attending: Vascular Surgery | Admitting: Vascular Surgery

## 2014-06-08 ENCOUNTER — Encounter: Payer: Self-pay | Admitting: Vascular Surgery

## 2014-06-08 ENCOUNTER — Ambulatory Visit (INDEPENDENT_AMBULATORY_CARE_PROVIDER_SITE_OTHER): Payer: Medicare Other | Admitting: Vascular Surgery

## 2014-06-08 VITALS — BP 118/78 | HR 83 | Resp 18 | Ht 69.0 in | Wt 220.0 lb

## 2014-06-08 DIAGNOSIS — L97909 Non-pressure chronic ulcer of unspecified part of unspecified lower leg with unspecified severity: Secondary | ICD-10-CM

## 2014-06-08 DIAGNOSIS — I83893 Varicose veins of bilateral lower extremities with other complications: Secondary | ICD-10-CM | POA: Diagnosis not present

## 2014-06-08 NOTE — Telephone Encounter (Signed)
Lorazepam refill request. Last filled: 03/24/13 # 90  Refills: 3 Please advise

## 2014-06-08 NOTE — Progress Notes (Signed)
The patient presents today for followup of laser ablation of his right great saphenous vein on 06/01/2014. He has been up and active since the procedure. He has the usual amount of mild discomfort in the medial aspect of his right thigh. He reports that he does not feel well in general today. Nothing specific the just enough to his usual self with some diaphoresis. He is hemodynamically stable and afebrile.  Is a right leg looks quite good today. He has palpable dorsalis pedis pulse and has mild bruising on the medial aspect of the thigh the laser ablation site with no evidence of skin loss.  Venous duplex today was reviewed with the patient. This does show a closure of his great saphenous vein from the proximal calf to the saphenofemoral junction. There is no evidence of DVT.  Impression and plan successful ablation of great saphenous vein for correction of her superficial venous hypertension. He will continue to wear his compression garment for one additional week. He does work is very faithfully and will continue this even after the prescribed time. I explained this is quite important for him for improve chances of healing his venous ulcer and reduce risk for recurrence. He will see [place] again on an as-needed basis

## 2014-06-09 ENCOUNTER — Other Ambulatory Visit: Payer: Self-pay | Admitting: Emergency Medicine

## 2014-06-09 MED ORDER — LORAZEPAM 1 MG PO TABS: ORAL_TABLET | ORAL | Status: DC

## 2014-06-09 NOTE — Telephone Encounter (Signed)
Pt advised- script in pick up drawer.

## 2014-06-09 NOTE — Telephone Encounter (Signed)
Prescription has been written.

## 2014-06-10 ENCOUNTER — Emergency Department (HOSPITAL_COMMUNITY)
Admission: EM | Admit: 2014-06-10 | Discharge: 2014-06-10 | Disposition: A | Discharge status: Home / Self Care | Payer: Medicare Other | Attending: Emergency Medicine | Admitting: Emergency Medicine

## 2014-06-10 ENCOUNTER — Encounter (HOSPITAL_COMMUNITY): Payer: Self-pay | Admitting: Emergency Medicine

## 2014-06-10 DIAGNOSIS — S0100XA Unspecified open wound of scalp, initial encounter: Secondary | ICD-10-CM | POA: Diagnosis not present

## 2014-06-10 DIAGNOSIS — IMO0002 Reserved for concepts with insufficient information to code with codable children: Secondary | ICD-10-CM | POA: Insufficient documentation

## 2014-06-10 DIAGNOSIS — S0101XA Laceration without foreign body of scalp, initial encounter: Secondary | ICD-10-CM

## 2014-06-10 DIAGNOSIS — Z8739 Personal history of other diseases of the musculoskeletal system and connective tissue: Secondary | ICD-10-CM | POA: Insufficient documentation

## 2014-06-10 DIAGNOSIS — S0990XA Unspecified injury of head, initial encounter: Secondary | ICD-10-CM | POA: Insufficient documentation

## 2014-06-10 DIAGNOSIS — Y9389 Activity, other specified: Secondary | ICD-10-CM | POA: Insufficient documentation

## 2014-06-10 DIAGNOSIS — Y9289 Other specified places as the place of occurrence of the external cause: Secondary | ICD-10-CM | POA: Diagnosis not present

## 2014-06-10 HISTORY — DX: Dorsalgia, unspecified: M54.9

## 2014-06-10 NOTE — ED Provider Notes (Signed)
CSN: [id number]     Arrival date & time 06/10/14  2011 History  This chart was scribed for Jeannett Senior, PA-C, working with Richarda Blade, MD by Steva Colder, ED Scribe. The patient was seen in room TR08C/TR08C at 8:47 PM.   Chief Complaint  Patient presents with  . Head Injury     The history is provided by the patient. No language interpreter was used.   HPI Comments: [name] is a 73 y.o. male who presents to the Emergency Department complaining of a head injury onset today. He states that he was working on a truck and he hit his head on the truck. He states that he hit his head on the bolt that holds on the rear view mirror. He states that it bled a lot when the incident first occurred. He states that his next door neighbor is a doctor and informed him to come in. He denies LOC, HA, nausea, vomiting, visual disturbance. He states that his tetanus is UTD. He states that he does not take any blood thinners.   Past Medical History  Diagnosis Date  . Arthritis   . Back pain    Past Surgical History  Procedure Laterality Date  . Back surgery    . Vascular surgery    . Hernia repair     No family history on file. History  Substance Use Topics  . Smoking status: Never Smoker   . Smokeless tobacco: Not on file  . Alcohol Use: No    Review of Systems  Eyes: Negative for visual disturbance.  Gastrointestinal: Negative for nausea and vomiting.  Neurological: Negative for syncope and headaches.    Allergies  Review of patient's allergies indicates no known allergies.  Home Medications   Prior to Admission medications   Not on File   BP 149/76  Pulse 93  Temp(Src) 98.6 F (37 C) (Oral)  Resp 18  SpO2 96%  Physical Exam  Nursing note and vitals reviewed. Constitutional: He is oriented to person, place, and time. He appears well-developed and well-nourished. No distress.  HENT:  Head: Normocephalic and atraumatic.  Eyes: Conjunctivae and EOM are normal.  Pupils are equal, round, and reactive to light.  Neck: Normal range of motion. Neck supple. No tracheal deviation present.  Cardiovascular: Normal rate.   Pulmonary/Chest: Effort normal. No respiratory distress.  Musculoskeletal: Normal range of motion.  Neurological: He is alert and oriented to person, place, and time.  5/5 and equal upper and lower extremity strength bilaterally. Equal grip strength bilaterally. Normal finger to nose and heel to shin. No pronator drift. Patellar reflexes 2+   Skin: Skin is warm and dry.  3cm laceration to top of the scalp  Psychiatric: He has a normal mood and affect. His behavior is normal.    ED Course  Procedures (including critical care time) DIAGNOSTIC STUDIES: Oxygen Saturation is 96% on room air, normal by my interpretation.    COORDINATION OF CARE: 8:57 PM-Discussed treatment plan which includes Laceration repair with pt at bedside and pt agreed to plan.   Labs Review Labs Reviewed - No data to display  Imaging Review No results found.  LACERATION REPAIR Performed by: Renold Genta Authorized by: Jeannett Senior A Consent: Verbal consent obtained. Risks and benefits: risks, benefits and alternatives were discussed Consent given by: patient Patient identity confirmed: provided demographic data Prepped and Draped in normal sterile fashion Wound explored  Laceration Location: scalp  Laceration Length: 3cm  No Foreign Bodies seen  or palpated  Anesthesia:none  Amount of cleaning: standard  Skin closure: staples  Number of sutures: 3  Technique: surgical staples  Patient tolerance: Patient tolerated the procedure well with no immediate complications.   MDM   Final diagnoses:  Scalp laceration, initial encounter    Pt here with head injury. Not anticoagulated. Normal neurological exam. No headache, amnesia, LOC, visual disturbances. No indication for CT or any imaging. Tetanus up to date. Laceration  repaired with staples. Home with follow up as needed. Return precautions discussed.   Filed Vitals:   06/10/14 2019  BP: 149/76  Pulse: 93  Temp: 98.6 F (37 C)  TempSrc: Oral  Resp: 18  SpO2: 96%     I personally performed the services described in this documentation, which was scribed in my presence. The recorded information has been reviewed and is accurate.    Renold Genta, PA-C 06/10/14 [id number]

## 2014-06-10 NOTE — ED Notes (Signed)
Pt states he hit his head on the bolt that holds on the rear view mirror.  Pt denies LOC.  Bleeding controlled.  Pt states his neighbor is a doctor and told him he may need a staple or two.

## 2014-06-10 NOTE — ED Provider Notes (Signed)
  Face-to-face evaluation   History: Accidental cut to head, while working on a car. No significant contusion to the head  Physical exam: Alert, calm, cooperative. Laceration, left parietal, not bleeding after staple closure  Medical screening examination/treatment/procedure(s) were conducted as a shared visit with non-physician practitioner(s) and myself.  I personally evaluated the patient during the encounter  Richarda Blade, MD 06/11/14 [phone]

## 2014-06-10 NOTE — Discharge Instructions (Signed)
Keep laceration clean and dry. Apply pressure for bleeding. Bacitracin twice a day. Follow up for staple removal in 7-10 days.    Laceration Care, Adult A laceration is a cut or lesion that goes through all layers of the skin and into the tissue just beneath the skin. TREATMENT  Some lacerations may not require closure. Some lacerations may not be able to be closed due to an increased risk of infection. It is important to see your caregiver as soon as possible after an injury to minimize the risk of infection and maximize the opportunity for successful closure. If closure is appropriate, pain medicines may be given, if needed. The wound will be cleaned to help prevent infection. Your caregiver will use stitches (sutures), staples, wound glue (adhesive), or skin adhesive strips to repair the laceration. These tools bring the skin edges together to allow for faster healing and a better cosmetic outcome. However, all wounds will heal with a scar. Once the wound has healed, scarring can be minimized by covering the wound with sunscreen during the day for 1 full year. HOME CARE INSTRUCTIONS  For sutures or staples:  Keep the wound clean and dry.  If you were given a bandage (dressing), you should change it at least once a day. Also, change the dressing if it becomes wet or dirty, or as directed by your caregiver.  Wash the wound with soap and water 2 times a day. Rinse the wound off with water to remove all soap. Pat the wound dry with a clean towel.  After cleaning, apply a thin layer of the antibiotic ointment as recommended by your caregiver. This will help prevent infection and keep the dressing from sticking.  You may shower as usual after the first 24 hours. Do not soak the wound in water until the sutures are removed.  Only take over-the-counter or prescription medicines for pain, discomfort, or fever as directed by your caregiver.  Get your sutures or staples removed as directed by your  caregiver. For skin adhesive strips:  Keep the wound clean and dry.  Do not get the skin adhesive strips wet. You may bathe carefully, using caution to keep the wound dry.  If the wound gets wet, pat it dry with a clean towel.  Skin adhesive strips will fall off on their own. You may trim the strips as the wound heals. Do not remove skin adhesive strips that are still stuck to the wound. They will fall off in time. For wound adhesive:  You may briefly wet your wound in the shower or bath. Do not soak or scrub the wound. Do not swim. Avoid periods of heavy perspiration until the skin adhesive has fallen off on its own. After showering or bathing, gently pat the wound dry with a clean towel.  Do not apply liquid medicine, cream medicine, or ointment medicine to your wound while the skin adhesive is in place. This may loosen the film before your wound is healed.  If a dressing is placed over the wound, be careful not to apply tape directly over the skin adhesive. This may cause the adhesive to be pulled off before the wound is healed.  Avoid prolonged exposure to sunlight or tanning lamps while the skin adhesive is in place. Exposure to ultraviolet light in the first year will darken the scar.  The skin adhesive will usually remain in place for 5 to 10 days, then naturally fall off the skin. Do not pick at the adhesive film. You  may need a tetanus shot if:  You cannot remember when you had your last tetanus shot.  You have never had a tetanus shot. If you get a tetanus shot, your arm may swell, get red, and feel warm to the touch. This is common and not a problem. If you need a tetanus shot and you choose not to have one, there is a rare chance of getting tetanus. Sickness from tetanus can be serious. SEEK MEDICAL CARE IF:   You have redness, swelling, or increasing pain in the wound.  You see a red line that goes away from the wound.  You have yellowish-white fluid (pus) coming from  the wound.  You have a fever.  You notice a bad smell coming from the wound or dressing.  Your wound breaks open before or after sutures have been removed.  You notice something coming out of the wound such as wood or glass.  Your wound is on your hand or foot and you cannot move a finger or toe. SEEK IMMEDIATE MEDICAL CARE IF:   Your pain is not controlled with prescribed medicine.  You have severe swelling around the wound causing pain and numbness or a change in color in your arm, hand, leg, or foot.  Your wound splits open and starts bleeding.  You have worsening numbness, weakness, or loss of function of any joint around or beyond the wound.  You develop painful lumps near the wound or on the skin anywhere on your body. MAKE SURE YOU:   Understand these instructions.  Will watch your condition.  Will get help right away if you are not doing well or get worse. Document Released: 10/27/2005 Document Revised: 01/19/2012 Document Reviewed: 04/22/2011 [facility] Patient Information 2015 [place], [place]. This information is not intended to replace advice given to you by your health care provider. Make sure you discuss any questions you have with your health care provider.

## 2014-06-12 ENCOUNTER — Encounter: Payer: Self-pay | Admitting: Vascular Surgery

## 2014-06-13 ENCOUNTER — Encounter: Payer: Self-pay | Admitting: Vascular Surgery

## 2014-06-15 ENCOUNTER — Ambulatory Visit (INDEPENDENT_AMBULATORY_CARE_PROVIDER_SITE_OTHER): Payer: Medicare Other | Admitting: Family Medicine

## 2014-06-15 ENCOUNTER — Other Ambulatory Visit: Payer: Self-pay | Admitting: Emergency Medicine

## 2014-06-15 DIAGNOSIS — E291 Testicular hypofunction: Secondary | ICD-10-CM | POA: Diagnosis not present

## 2014-06-15 DIAGNOSIS — E349 Endocrine disorder, unspecified: Secondary | ICD-10-CM

## 2014-06-15 NOTE — Telephone Encounter (Signed)
Pt came in for his testosterone injection and asked about this refill request.  Pt was advised to RTC. He will be in tomorrow.

## 2014-06-15 NOTE — Telephone Encounter (Signed)
Dr Everlene Farrier, you saw pt just after we gave him a RF of this last month w/note that he needed to RTC for more, but he didn't talk to you about HTN. Do you want to RF? I do see that you did a CMP in Apr and there was a concern about renal function.

## 2014-06-17 ENCOUNTER — Telehealth: Payer: Self-pay | Admitting: Emergency Medicine

## 2014-06-17 NOTE — Telephone Encounter (Signed)
The pharmacy at [facility] called and said that they do not accept electronic prescriptions for controlled substances. They need a paper copy in order to refill the patient's Lorazapam.

## 2014-06-17 NOTE — Telephone Encounter (Deleted)
The pharmacy at [facility] called and said that they do not accept electronic prescriptions for controlled substances.  They need a paper copy in order to refill the patient's

## 2014-06-18 ENCOUNTER — Other Ambulatory Visit: Payer: Self-pay | Admitting: Emergency Medicine

## 2014-06-18 MED ORDER — LORAZEPAM 1 MG PO TABS
ORAL_TABLET | ORAL | Status: DC
Start: 2014-06-18 — End: 2014-12-13

## 2014-06-18 NOTE — Telephone Encounter (Signed)
Prescription left at the team leaders desk. for patient to pick up.

## 2014-06-18 NOTE — Telephone Encounter (Signed)
It looks like this printed out, did you give it to the patient?

## 2014-06-19 NOTE — Telephone Encounter (Signed)
LMOM that rx is up front for p/u 

## 2014-06-26 ENCOUNTER — Ambulatory Visit (INDEPENDENT_AMBULATORY_CARE_PROVIDER_SITE_OTHER): Payer: Medicare Other | Admitting: Internal Medicine

## 2014-06-26 VITALS — BP 120/80 | HR 84 | Temp 98.3°F | Resp 16 | Ht 69.0 in | Wt 232.0 lb

## 2014-06-26 DIAGNOSIS — Z4802 Encounter for removal of sutures: Secondary | ICD-10-CM

## 2014-06-26 DIAGNOSIS — S0100XA Unspecified open wound of scalp, initial encounter: Secondary | ICD-10-CM | POA: Diagnosis not present

## 2014-06-26 DIAGNOSIS — L989 Disorder of the skin and subcutaneous tissue, unspecified: Secondary | ICD-10-CM | POA: Diagnosis not present

## 2014-06-26 DIAGNOSIS — E291 Testicular hypofunction: Secondary | ICD-10-CM | POA: Diagnosis not present

## 2014-06-26 NOTE — Progress Notes (Signed)
Subjective:  This chart was scribed for [name] Lin, MD by Jeanell Sparrow, ED Scribe. This patient was seen in room 2 and the patient's care was started at 8:19 PM.   Patient ID: [name], male    DOB: 01-01-1941, 73 y.o.   MRN: [id number] Chief Complaint  Patient presents with  . Suture / Staple Removal    from scalp  . Injections    Testosterone    HPI HPI Comments: [name] is a 73 y.o. male who presents to the [facility] complaining of a need for staple removal. He states that he had a head laceration that occurred at work. Staples were placed on August 1 and he needs removal today  He reports that he has some lesions on his back that concern him. Over his upper back there are things that seem to become irritated or itch with frequency and then return to normal. He cannot see these lesions. Long history of sun exposure  He is here for his regular testosterone injection.  Patient Active Problem List   Diagnosis Date Noted  . Ulcer of lower limb, unspecified 01/10/2014  . Varicose veins of lower extremities with other complications 96/22/2979  . 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 Renal insufficiency 12/21/2013  . Swelling in head/neck 07/28/2012  . 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 Pain in limb 02/12/2012  . HYPERLIPIDEMIA 02/28/2008  . ALLERGIC RHINITIS 02/28/2008  . SLEEP APNEA 02/28/2008     No Known Allergies Prior to Admission medications   Medication Sig Start Date End Date Taking? Authorizing Provider  amoxicillin (AMOXIL) 500 MG capsule  12/19/13  Yes Historical Provider, MD  aspirin 81 MG tablet Take 81 mg by mouth daily.   Yes Historical Provider, MD  buPROPion (WELLBUTRIN XL) 300 MG 24 hr tablet TAKE 1 TABLET BY MOUTH EVERY MORNING 11/23/13  Yes [name], MD  calcium carbonate 200 MG capsule Take 250 mg by mouth daily.    Yes Historical Provider, MD  cetirizine (ZYRTEC) 10 MG chewable tablet Chew 10 mg by mouth daily.   Yes Historical Provider, MD  Diphenhyd-Hydrocort-Nystatin (FIRST-DUKES MOUTHWASH) SUSP 1 teaspoon his rinse gargle and spit 4 times a day 07/30/12  Yes [name], MD  doxazosin (CARDURA) 8 MG tablet Take 1 tablet (8 mg total) by mouth daily.   Yes [name], MD  doxycycline (VIBRA-TABS) 100 MG tablet Take 1 tablet (100 mg total) by mouth 2 (two) times daily. 04/19/14  Yes [name], MD  esomeprazole (NEXIUM) 20 MG packet Take 20 mg by mouth daily before breakfast.   Yes Historical Provider, MD  flurazepam (DALMANE) 15 MG capsule Take 1 capsule (15 mg total) by mouth at bedtime as needed for sleep. 08/12/12  Yes [name], MD  furosemide (LASIX) 40 MG tablet Take 1 tablet (40 mg total) by mouth daily. PATIENT NEEDS CHECK UP FOR ADDITIONAL REFILLS   Yes [name], MD  gabapentin (NEURONTIN) 300 MG capsule Take 300 mg by mouth 3 (three) times daily.   Yes Historical Provider, MD  glucosamine-chondroitin 500-400 MG tablet Take 1 tablet by mouth 3 (three) times daily.   Yes Historical Provider, MD  HYDROcodone-acetaminophen (NORCO/VICODIN) 5-325 MG per tablet Take 1-2 tablets per day as needed for back or leg pain. 05/19/14  Yes [name], MD  LORazepam (ATIVAN) 1 MG tablet Take one tablet  bid prn anxiety 06/18/14  Yes [name], MD  meloxicam (MOBIC) 15 MG tablet Take 15 mg by mouth daily.   Yes Historical Provider, MD  methocarbamol (ROBAXIN) 500 MG tablet Take 500 mg by mouth as needed for muscle spasms.   Yes Historical Provider, MD  metolazone (ZAROXOLYN) 2.5 MG tablet Take 1 tablet (2.5 mg total) by mouth daily. 04/25/13  Yes [name], MD  Multiple Vitamin (MULTIVITAMIN) tablet Take 1 tablet by mouth daily.   Yes Historical Provider, MD  mupirocin ointment (BACTROBAN) 2 % Apply topically 2 (two) times daily. PATIENT NEEDS OFFICE VISIT FOR ADDITIONAL REFILLS 12/02/13  Yes  Mancel Bale, PA-C  omeprazole (PRILOSEC) 40 MG capsule Take 40 mg by mouth daily.   Yes Historical Provider, MD  potassium chloride SA (K-DUR,KLOR-CON) 20 MEQ tablet Take 1 tablet (20 mEq total) by mouth 2 (two) times daily. PATIENT NEEDS OFFICE VISIT FOR ADDITIONAL REFILLS   Yes [name], MD  sildenafil (REVATIO) 20 MG tablet Take 5 tablets daily as needed for erectile dysfunction. 04/13/14  Yes [name], MD  sildenafil (REVATIO) 20 MG tablet Take 3 tablets one hour prior to intercourse 04/13/14  Yes [name], MD  sildenafil (VIAGRA) 100 MG tablet Take 0.5-1 tablets (50-100 mg total) by mouth daily as needed for erectile dysfunction. 04/10/14  Yes [name], MD  valACYclovir (VALTREX) 1000 MG tablet Take 1 tablet (1,000 mg total) by mouth 2 (two) times daily. 07/30/12  Yes [name], MD  fluticasone (FLONASE) 50 MCG/ACT nasal spray Place 2 sprays into the nose daily. 07/28/12   Lyndal Pulley, DO  testosterone cypionate (DEPOTESTOTERONE CYPIONATE) 200 MG/ML injection Inject 0.5 mLs (100 mg total) into the muscle every 7 (seven) days. 02/11/14   [name], MD       Review of Systems No fever or night sweats No abnormal weight loss No chest pains palpitation shortness of breath or edema No headaches or dizziness     Objective:   Physical Exam  Nursing note and vitals reviewed. Constitutional: He is oriented to person, place, and time. He appears well-developed and well-nourished. No distress.  HENT:  Head: Normocephalic.  Eyes: Conjunctivae and EOM are normal. Pupils are equal, round, and reactive to light.  Neck: Neck supple.  Cardiovascular: Normal rate.   Pulmonary/Chest: Effort normal.  Neurological: He is alert and oriented to person, place, and time. No cranial nerve deficit.  Skin: Skin is warm and dry.  Scalp wound well healed. Staples removed.   Mole survey of neck and back reveal normal findings with no extra crusting lesion or dysplastic lesions.     there is one atypical area that has been observed for several years without change the right periscapular area        Assessment & Plan:  I have completed the patient encounter in its entirety as documented by the scribe, with editing by me where necessary. Correna Meacham P. Laney Pastor, M.D. Hypogonadism male  Skin lesion  Encounter for staple removal  Open wound of scalp, without mention of complication  Testosterone shot given

## 2014-07-08 NOTE — Progress Notes (Signed)
Patient presenting for testosterone injection only.

## 2014-07-10 ENCOUNTER — Telehealth: Payer: Self-pay

## 2014-07-10 NOTE — Telephone Encounter (Signed)
HYDROcodone-acetaminophen (NORCO/VICODIN) 5-325 MG per tablet   Patient is requesting to pick  This up before five today.    [phone] (H)

## 2014-07-11 ENCOUNTER — Ambulatory Visit (INDEPENDENT_AMBULATORY_CARE_PROVIDER_SITE_OTHER): Payer: Medicare Other

## 2014-07-11 ENCOUNTER — Other Ambulatory Visit: Payer: Self-pay | Admitting: Emergency Medicine

## 2014-07-11 ENCOUNTER — Telehealth: Payer: Self-pay

## 2014-07-11 DIAGNOSIS — E291 Testicular hypofunction: Secondary | ICD-10-CM

## 2014-07-11 DIAGNOSIS — M546 Pain in thoracic spine: Secondary | ICD-10-CM

## 2014-07-11 MED ORDER — DOXAZOSIN MESYLATE 8 MG PO TABS: 8.0000 mg | ORAL_TABLET | Freq: Every day | ORAL | Status: DC

## 2014-07-11 MED ORDER — HYDROCODONE-ACETAMINOPHEN 5-325 MG PO TABS: ORAL_TABLET | ORAL | Status: DC

## 2014-07-11 MED ORDER — POTASSIUM CHLORIDE CRYS ER 20 MEQ PO TBCR: 20.0000 meq | EXTENDED_RELEASE_TABLET | Freq: Two times a day (BID) | ORAL | Status: DC

## 2014-07-11 NOTE — Telephone Encounter (Signed)
Pt called for third time to check on prescription put in yesterday, let him know that the request had been directed to Dr. Everlene Farrier, and  to allot for 24-72 hours, and he would receive a call when ready for pick up. Pt stated that he would be int the [facility] [address] this afternoon, and wants it to be ready for pick up then.

## 2014-07-11 NOTE — Telephone Encounter (Signed)
This has been done by Dr Everlene Farrier.

## 2014-07-11 NOTE — Telephone Encounter (Signed)
Pt needs a RF of his potassium. Advised him that he needs to get his blood drawn. He is also due for a testosterone check in 4 weeks. Will be seen for an OV when he comes in next for his testosterone injection.

## 2014-07-15 ENCOUNTER — Telehealth: Payer: Self-pay

## 2014-07-15 NOTE — Telephone Encounter (Signed)
Patient wanted to know since he had his leg surgery done 4 week ago does he need to still take "Lasix", please advise. Patients call back number is [phone]

## 2014-07-17 NOTE — Telephone Encounter (Signed)
LMOM for pt to CB.  

## 2014-07-20 NOTE — Telephone Encounter (Signed)
LM for pt to RTC- labs not done since April- last rx stated OV needed for further refills.

## 2014-07-21 ENCOUNTER — Ambulatory Visit (INDEPENDENT_AMBULATORY_CARE_PROVIDER_SITE_OTHER): Payer: Medicare Other | Admitting: Emergency Medicine

## 2014-07-21 VITALS — BP 134/72 | HR 83 | Temp 97.8°F | Resp 18 | Ht 69.0 in | Wt 233.0 lb

## 2014-07-21 DIAGNOSIS — M545 Low back pain, unspecified: Secondary | ICD-10-CM

## 2014-07-21 DIAGNOSIS — R5381 Other malaise: Secondary | ICD-10-CM | POA: Diagnosis not present

## 2014-07-21 DIAGNOSIS — G4733 Obstructive sleep apnea (adult) (pediatric): Secondary | ICD-10-CM

## 2014-07-21 DIAGNOSIS — R5383 Other fatigue: Secondary | ICD-10-CM | POA: Diagnosis not present

## 2014-07-21 DIAGNOSIS — R209 Unspecified disturbances of skin sensation: Secondary | ICD-10-CM

## 2014-07-21 DIAGNOSIS — R200 Anesthesia of skin: Secondary | ICD-10-CM

## 2014-07-21 DIAGNOSIS — E291 Testicular hypofunction: Secondary | ICD-10-CM

## 2014-07-21 DIAGNOSIS — Z23 Encounter for immunization: Secondary | ICD-10-CM | POA: Diagnosis not present

## 2014-07-21 DIAGNOSIS — E876 Hypokalemia: Secondary | ICD-10-CM

## 2014-07-21 DIAGNOSIS — Z9989 Dependence on other enabling machines and devices: Secondary | ICD-10-CM

## 2014-07-21 LAB — POCT CBC
Granulocyte percent: 73.6 %G (ref 37–80)
HCT, POC: 50 % (ref 43.5–53.7)
HEMOGLOBIN: 16.2 g/dL (ref 14.1–18.1)
Lymph, poc: 1.3 (ref 0.6–3.4)
MCH, POC: 31.9 pg — AB (ref 27–31.2)
MCHC: 32.4 g/dL (ref 31.8–35.4)
MCV: 98.4 fL — AB (ref 80–97)
MID (cbc): 0.6 (ref 0–0.9)
MPV: 7.7 fL (ref 0–99.8)
POC GRANULOCYTE: 5.2 (ref 2–6.9)
POC LYMPH PERCENT: 18.1 %L (ref 10–50)
POC MID %: 8.3 %M (ref 0–12)
Platelet Count, POC: 187 10*3/uL (ref 142–424)
RBC: 5.08 M/uL (ref 4.69–6.13)
RDW, POC: 14.2 %
WBC: 7 10*3/uL (ref 4.6–10.2)

## 2014-07-21 MED ORDER — TESTOSTERONE CYPIONATE 200 MG/ML IM SOLN: 100.0000 mg | INTRAMUSCULAR | Status: DC

## 2014-07-21 MED ORDER — BETAMETHASONE DIPROPIONATE AUG 0.05 % EX CREA: TOPICAL_CREAM | Freq: Two times a day (BID) | CUTANEOUS | Status: DC

## 2014-07-21 NOTE — Progress Notes (Signed)
Subjective:  This chart was scribed for [name], MD by Terressa Koyanagi, ED Scribe. This patient was seen in room 5 and the patient's care was started at 5:35 PM.      Patient ID: [name], male    DOB: 19-Mar-1941, 73 y.o.   MRN: [id number]  HPI HPI Comments: [name] is a 73 y.o. male, with medical Hx noted below, who presents to the [facility] for multiple complaints.   Testosterone Check: Pt reports he needs to get his testosterone injection today.   Rash: Pt also complains of erythematous rash to both his left and right forearms due to poison ivy exposure. Pt reports using topical creams with some relief, however, wishes for some alternative treatments.  Fatigue: Pt also complains of intermittent fatigue. Pt reports he has not been tested recently for sleep apnea.   Numbness of Right Arm: Pt reports that in the morning, while he is trying to work on his lap top, his right arm goes to sleep and he has to shake the arm for a bit before sensation returns.   Lasix & Potassium Meds: Pt reports the edema in his LE have subsided quite a bit since his surgery a few weeks ago, however, he states that his toes feel like they are dehydrated; pt states "it feels like my toes are wrapped in saran wrap." Pt is taking lasix 1x a day a potassium pill 1 a day.    Past Medical History  Diagnosis Date  . 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 Sleep apnea   . 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 Allergy   . Depression   . Neuromuscular disorder   . Anxiety   . Arthritis   . Back pain    Review of Systems  Constitutional: Positive for fatigue. Negative for fever and chills.  Musculoskeletal:       Edema LE  Skin: Positive for rash (upper extremities bilaterally).  Neurological: Positive for numbness (Right Arm).  Psychiatric/Behavioral: Negative for confusion.      Objective:   Physical Exam CONSTITUTIONAL: Well developed/well  nourished HEAD: Normocephalic/atraumatic EYES: EOMI/PERRL ENMT: Mucous membranes moist NECK: supple no meningeal signs SPINE:entire spine nontender CV: S1/S2 noted, no murmurs/rubs/gallops noted LUNGS: Lungs are clear to auscultation bilaterally, no apparent distress ABDOMEN: soft, nontender, no rebound or guarding GU:no cva tenderness NEURO: Pt is awake/alert, moves all extremitiesx4 EXTREMITIES: pulses normal, full ROM SKIN: warm, color normal. Firm vesicles with redness both forearms. 1x1 wound on right shin which is covered by Xeroform dressing. Pt is wearing support stockings.  PSYCH: no abnormalities of mood noted  Results for orders placed in visit on 07/21/14  POCT CBC      Result Value Ref Range   WBC 7.0  4.6 - 10.2 K/uL   Lymph, poc 1.3  0.6 - 3.4   POC LYMPH PERCENT 18.1  10 - 50 %L   MID (cbc) 0.6  0 - 0.9   POC MID % 8.3  0 - 12 %M   POC Granulocyte 5.2  2 - 6.9   Granulocyte percent 73.6  37 - 80 %G   RBC 5.08  4.69 - 6.13 M/uL   Hemoglobin 16.2  14.1 - 18.1 g/dL   HCT, POC 50.0  43.5 - 53.7 %   MCV 98.4 (*) 80 - 97 fL   MCH, POC 31.9 (*) 27 - 31.2 pg  MCHC 32.4  31.8 - 35.4 g/dL   RDW, POC 14.2     Platelet Count, POC 187  142 - 424 K/uL   MPV 7.7  0 - 99.8 fL       Assessment & Plan:  Testosterone level was done today. He had a PSA done in April. He needs to have this done in about 3 months. Have set him up for a repeat sleep study. 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 His creatinine was elevated last visit this will be repeated along with potassium level. He is currently taken one potassium pill and one Lasix pill daily

## 2014-07-22 LAB — COMPREHENSIVE METABOLIC PANEL
ALBUMIN: 4.2 g/dL (ref 3.5–5.2)
ALK PHOS: 93 U/L (ref 39–117)
ALT: 30 U/L (ref 0–53)
AST: 26 U/L (ref 0–37)
BUN: 21 mg/dL (ref 6–23)
CALCIUM: 9.2 mg/dL (ref 8.4–10.5)
CHLORIDE: 101 meq/L (ref 96–112)
CO2: 25 mEq/L (ref 19–32)
Creat: 1.47 mg/dL — ABNORMAL HIGH (ref 0.50–1.35)
Glucose, Bld: 86 mg/dL (ref 70–99)
POTASSIUM: 4.1 meq/L (ref 3.5–5.3)
SODIUM: 137 meq/L (ref 135–145)
Total Bilirubin: 0.6 mg/dL (ref 0.2–1.2)
Total Protein: 7 g/dL (ref 6.0–8.3)

## 2014-07-22 LAB — TSH: TSH: 1.684 u[IU]/mL (ref 0.350–4.500)

## 2014-07-24 LAB — TESTOSTERONE, FREE, TOTAL, SHBG
Sex Hormone Binding: 30 nmol/L (ref 13–71)
TESTOSTERONE: 405 ng/dL (ref 300–890)
Testosterone, Free: 87.1 pg/mL (ref 47.0–244.0)
Testosterone-% Free: 2.2 % (ref 1.6–2.9)

## 2014-07-28 ENCOUNTER — Institutional Professional Consult (permissible substitution): Payer: Medicare Other | Admitting: Neurology

## 2014-08-01 ENCOUNTER — Ambulatory Visit (INDEPENDENT_AMBULATORY_CARE_PROVIDER_SITE_OTHER): Payer: Medicare Other

## 2014-08-01 ENCOUNTER — Telehealth: Payer: Self-pay

## 2014-08-01 ENCOUNTER — Other Ambulatory Visit: Payer: Self-pay | Admitting: Emergency Medicine

## 2014-08-01 ENCOUNTER — Other Ambulatory Visit: Payer: Self-pay | Admitting: Internal Medicine

## 2014-08-01 DIAGNOSIS — E291 Testicular hypofunction: Secondary | ICD-10-CM

## 2014-08-01 MED ORDER — FUROSEMIDE 40 MG PO TABS: 40.0000 mg | ORAL_TABLET | Freq: Every day | ORAL | Status: DC

## 2014-08-01 MED ORDER — POTASSIUM CHLORIDE CRYS ER 20 MEQ PO TBCR: 20.0000 meq | EXTENDED_RELEASE_TABLET | Freq: Every day | ORAL | Status: DC

## 2014-08-01 NOTE — Telephone Encounter (Signed)
Pharmacy called advising that patient wants a 90 day supply of medications they requested

## 2014-08-01 NOTE — Progress Notes (Signed)
   Subjective:    Patient ID: [name], male    DOB: [birth year], 73 y.o.   MRN: [id number]  HPI  Pt needs a RF of his potassium while he is here for his testosterone injection. See labs. K+ RF'ed   Review of Systems     Objective:   Physical Exam        Assessment & Plan:

## 2014-08-03 ENCOUNTER — Institutional Professional Consult (permissible substitution): Payer: Medicare Other | Admitting: Neurology

## 2014-08-03 MED ORDER — FUROSEMIDE 40 MG PO TABS: 40.0000 mg | ORAL_TABLET | Freq: Every day | ORAL | Status: DC

## 2014-08-03 MED ORDER — POTASSIUM CHLORIDE CRYS ER 20 MEQ PO TBCR: 20.0000 meq | EXTENDED_RELEASE_TABLET | Freq: Every day | ORAL | Status: DC

## 2014-08-13 ENCOUNTER — Telehealth: Payer: Self-pay

## 2014-08-13 DIAGNOSIS — M546 Pain in thoracic spine: Secondary | ICD-10-CM

## 2014-08-13 NOTE — Telephone Encounter (Signed)
Patient requesting a refill on his "Hydrocodone". Please call patient when ready to be picked up at [phone]

## 2014-08-14 MED ORDER — HYDROCODONE-ACETAMINOPHEN 5-325 MG PO TABS: ORAL_TABLET | ORAL | Status: DC

## 2014-08-14 NOTE — Telephone Encounter (Signed)
Meds refill

## 2014-08-14 NOTE — Telephone Encounter (Signed)
LMOM that rx is ready for p/u 

## 2014-08-22 ENCOUNTER — Telehealth: Payer: Self-pay

## 2014-08-22 ENCOUNTER — Ambulatory Visit (INDEPENDENT_AMBULATORY_CARE_PROVIDER_SITE_OTHER): Payer: Medicare Other | Admitting: *Deleted

## 2014-08-22 ENCOUNTER — Telehealth: Payer: Self-pay | Admitting: *Deleted

## 2014-08-22 DIAGNOSIS — E291 Testicular hypofunction: Secondary | ICD-10-CM | POA: Diagnosis not present

## 2014-08-22 MED ORDER — TESTOSTERONE CYPIONATE 200 MG/ML IM SOLN: 100.0000 mg | INTRAMUSCULAR | Status: DC

## 2014-08-22 NOTE — Telephone Encounter (Signed)
The patient called about his prescription written during his visit on 08/22/14 for Hydrocodone  He said that it was written for 30 days, but he would like a prescription written for 90 days because the cost is lower.  He uses the pharmacy at [facility], which was closing for the night, so they could not call in to request the change.  Patient CB#: [phone]

## 2014-08-22 NOTE — Telephone Encounter (Signed)
Needs a refill on testosterone sent to [facility]

## 2014-08-23 NOTE — Telephone Encounter (Signed)
Pt advised.

## 2014-08-23 NOTE — Telephone Encounter (Signed)
Faxed script to the pharmacy.

## 2014-08-23 NOTE — Telephone Encounter (Signed)
rx for >30d controlled substance not allowed

## 2014-08-30 ENCOUNTER — Ambulatory Visit (INDEPENDENT_AMBULATORY_CARE_PROVIDER_SITE_OTHER): Payer: Medicare Other | Admitting: Radiology

## 2014-08-30 DIAGNOSIS — E291 Testicular hypofunction: Secondary | ICD-10-CM | POA: Diagnosis not present

## 2014-08-30 DIAGNOSIS — E349 Endocrine disorder, unspecified: Secondary | ICD-10-CM

## 2014-09-08 ENCOUNTER — Ambulatory Visit (INDEPENDENT_AMBULATORY_CARE_PROVIDER_SITE_OTHER): Payer: Medicare Other | Admitting: *Deleted

## 2014-09-08 DIAGNOSIS — E291 Testicular hypofunction: Secondary | ICD-10-CM | POA: Diagnosis not present

## 2014-09-14 DIAGNOSIS — M542 Cervicalgia: Secondary | ICD-10-CM | POA: Diagnosis not present

## 2014-09-14 DIAGNOSIS — M25569 Pain in unspecified knee: Secondary | ICD-10-CM | POA: Diagnosis not present

## 2014-09-14 DIAGNOSIS — M5416 Radiculopathy, lumbar region: Secondary | ICD-10-CM | POA: Diagnosis not present

## 2014-09-14 DIAGNOSIS — M47817 Spondylosis without myelopathy or radiculopathy, lumbosacral region: Secondary | ICD-10-CM | POA: Diagnosis not present

## 2014-09-15 ENCOUNTER — Ambulatory Visit (INDEPENDENT_AMBULATORY_CARE_PROVIDER_SITE_OTHER): Payer: Medicare Other | Admitting: Radiology

## 2014-09-15 VITALS — BP 138/70 | HR 70

## 2014-09-15 DIAGNOSIS — E291 Testicular hypofunction: Secondary | ICD-10-CM | POA: Diagnosis not present

## 2014-09-15 MED ORDER — TESTOSTERONE CYPIONATE 100 MG/ML IM SOLN
100.0000 mg | INTRAMUSCULAR | Status: AC
  Administered 2014-09-26: 100 mg via INTRAMUSCULAR

## 2014-09-22 DIAGNOSIS — M47817 Spondylosis without myelopathy or radiculopathy, lumbosacral region: Secondary | ICD-10-CM | POA: Diagnosis not present

## 2014-09-22 DIAGNOSIS — M5416 Radiculopathy, lumbar region: Secondary | ICD-10-CM | POA: Diagnosis not present

## 2014-09-26 ENCOUNTER — Telehealth: Payer: Self-pay | Admitting: Emergency Medicine

## 2014-09-26 ENCOUNTER — Ambulatory Visit (INDEPENDENT_AMBULATORY_CARE_PROVIDER_SITE_OTHER): Payer: Medicare Other

## 2014-09-26 DIAGNOSIS — E291 Testicular hypofunction: Secondary | ICD-10-CM | POA: Diagnosis not present

## 2014-09-26 NOTE — Telephone Encounter (Signed)
Patient requested a refill on Hydrocodone. Wants to pick this up this evening when he comes in for his shot.   [phone]

## 2014-09-27 ENCOUNTER — Other Ambulatory Visit: Payer: Self-pay | Admitting: Emergency Medicine

## 2014-09-27 DIAGNOSIS — M546 Pain in thoracic spine: Secondary | ICD-10-CM

## 2014-09-27 MED ORDER — HYDROCODONE-ACETAMINOPHEN 5-325 MG PO TABS: ORAL_TABLET | ORAL | Status: DC

## 2014-09-27 NOTE — Telephone Encounter (Signed)
Notified pt on VM Rx is ready. 

## 2014-10-10 ENCOUNTER — Ambulatory Visit (INDEPENDENT_AMBULATORY_CARE_PROVIDER_SITE_OTHER): Payer: Medicare Other | Admitting: Radiology

## 2014-10-10 ENCOUNTER — Telehealth: Payer: Self-pay

## 2014-10-10 DIAGNOSIS — E291 Testicular hypofunction: Secondary | ICD-10-CM

## 2014-10-10 NOTE — Progress Notes (Signed)
   Subjective:    Patient ID: [name], male    DOB: 04/01/1941, 73 y.o.   MRN: [id number]  HPI Patient presents for follow up testosterone injection. I explained to him I can not give the injection out of the bottle he presented with. The bottle was opened on 08/30/14. According to CDC guidelines can only use the injectable for 28 days after bottle opened.    Review of Systems     Objective:   Physical Exam        Assessment & Plan:  Patient would like to discuss with Dr Everlene Farrier.

## 2014-10-10 NOTE — Telephone Encounter (Signed)
Pt called and states he was seen at [facility] today and the staff did not give him his immunization because the vial was already opened. Pt states the vial is always open when he comes to have this done as it is a reusable vial. Pt would like to speak to Dr. Laney Pastor to find out what's going on. Please return call and advise. CB # [phone]

## 2014-10-12 NOTE — Telephone Encounter (Signed)
LM for rtn call. 

## 2014-10-16 NOTE — Telephone Encounter (Signed)
Patient is very rude and angry in regards to this matter. He is demanding Dr. Laney Pastor or Dr. Everlene Farrier call him back. This is a ridiculous policy that we have decided to interpret to our advantage. He states that he has looked into the CDC policy and only sees this rule as it relates to medication stored within a facility that administers injections to multiple patients and utilizes a multi use vial.   I pulled up the CDC website and read the policy from their website that clearly states medication use by date should not exceed 28 days from the opened date. He became very upset. States he wants a call back from [facility] or [facility] and hung up on me.

## 2014-10-16 NOTE — Telephone Encounter (Signed)
Lm on the additional contact number listed.

## 2014-10-16 NOTE — Telephone Encounter (Signed)
LM for rtn call. 

## 2014-10-16 NOTE — Telephone Encounter (Signed)
Patient is calling back returning [place]. Please advise call back number is: [phone]

## 2014-10-17 DIAGNOSIS — N401 Enlarged prostate with lower urinary tract symptoms: Secondary | ICD-10-CM | POA: Diagnosis not present

## 2014-10-17 DIAGNOSIS — E291 Testicular hypofunction: Secondary | ICD-10-CM | POA: Diagnosis not present

## 2014-10-17 DIAGNOSIS — R351 Nocturia: Secondary | ICD-10-CM | POA: Diagnosis not present

## 2014-10-17 DIAGNOSIS — R312 Other microscopic hematuria: Secondary | ICD-10-CM | POA: Diagnosis not present

## 2014-10-17 DIAGNOSIS — R350 Frequency of micturition: Secondary | ICD-10-CM | POA: Diagnosis not present

## 2014-10-20 DIAGNOSIS — N400 Enlarged prostate without lower urinary tract symptoms: Secondary | ICD-10-CM | POA: Diagnosis not present

## 2014-10-20 DIAGNOSIS — K429 Umbilical hernia without obstruction or gangrene: Secondary | ICD-10-CM | POA: Diagnosis not present

## 2014-10-20 DIAGNOSIS — N401 Enlarged prostate with lower urinary tract symptoms: Secondary | ICD-10-CM | POA: Diagnosis not present

## 2014-10-20 DIAGNOSIS — R312 Other microscopic hematuria: Secondary | ICD-10-CM | POA: Diagnosis not present

## 2014-10-20 DIAGNOSIS — N201 Calculus of ureter: Secondary | ICD-10-CM | POA: Diagnosis not present

## 2014-10-20 DIAGNOSIS — N200 Calculus of kidney: Secondary | ICD-10-CM | POA: Diagnosis not present

## 2014-10-25 DIAGNOSIS — N200 Calculus of kidney: Secondary | ICD-10-CM | POA: Diagnosis not present

## 2014-10-25 DIAGNOSIS — N183 Chronic kidney disease, stage 3 (moderate): Secondary | ICD-10-CM | POA: Diagnosis not present

## 2014-10-25 DIAGNOSIS — N201 Calculus of ureter: Secondary | ICD-10-CM | POA: Diagnosis not present

## 2014-10-25 DIAGNOSIS — R351 Nocturia: Secondary | ICD-10-CM | POA: Diagnosis not present

## 2014-10-25 DIAGNOSIS — N401 Enlarged prostate with lower urinary tract symptoms: Secondary | ICD-10-CM | POA: Diagnosis not present

## 2014-11-19 ENCOUNTER — Telehealth: Payer: Self-pay

## 2014-11-19 NOTE — Telephone Encounter (Signed)
Pt wants refill on his HYDROcodone-acetaminophen (NORCO/VICODIN) 5-325 MG per tablet [[id number] prescription. Please advise at [phone]

## 2014-11-20 ENCOUNTER — Other Ambulatory Visit: Payer: Self-pay | Admitting: Emergency Medicine

## 2014-11-20 DIAGNOSIS — M546 Pain in thoracic spine: Secondary | ICD-10-CM

## 2014-11-20 MED ORDER — HYDROCODONE-ACETAMINOPHEN 5-325 MG PO TABS: ORAL_TABLET | ORAL | Status: DC

## 2014-11-20 NOTE — Telephone Encounter (Signed)
LMOM that this is ready for p/u

## 2014-11-20 NOTE — Telephone Encounter (Signed)
Call patient he can pick up prescription at [phone]

## 2014-11-22 ENCOUNTER — Ambulatory Visit: Payer: Medicare Other | Admitting: Neurology

## 2014-11-28 DIAGNOSIS — N200 Calculus of kidney: Secondary | ICD-10-CM | POA: Diagnosis not present

## 2014-11-28 DIAGNOSIS — N201 Calculus of ureter: Secondary | ICD-10-CM | POA: Diagnosis not present

## 2014-12-12 ENCOUNTER — Ambulatory Visit (INDEPENDENT_AMBULATORY_CARE_PROVIDER_SITE_OTHER): Payer: Medicare Other | Admitting: Neurology

## 2014-12-12 ENCOUNTER — Encounter: Payer: Self-pay | Admitting: Neurology

## 2014-12-12 ENCOUNTER — Other Ambulatory Visit: Payer: Self-pay | Admitting: Emergency Medicine

## 2014-12-12 VITALS — BP 140/90 | Resp 16 | Ht 70.0 in | Wt 251.2 lb

## 2014-12-12 DIAGNOSIS — M47896 Other spondylosis, lumbar region: Secondary | ICD-10-CM | POA: Diagnosis not present

## 2014-12-12 DIAGNOSIS — M25562 Pain in left knee: Secondary | ICD-10-CM | POA: Diagnosis not present

## 2014-12-12 DIAGNOSIS — M5416 Radiculopathy, lumbar region: Secondary | ICD-10-CM | POA: Diagnosis not present

## 2014-12-12 DIAGNOSIS — M546 Pain in thoracic spine: Secondary | ICD-10-CM

## 2014-12-12 DIAGNOSIS — M47816 Spondylosis without myelopathy or radiculopathy, lumbar region: Secondary | ICD-10-CM | POA: Insufficient documentation

## 2014-12-12 MED ORDER — HYDROCODONE-ACETAMINOPHEN 5-325 MG PO TABS: ORAL_TABLET | ORAL | Status: DC

## 2014-12-12 MED ORDER — GABAPENTIN 300 MG PO CAPS: 300.0000 mg | ORAL_CAPSULE | Freq: Three times a day (TID) | ORAL | Status: DC

## 2014-12-12 NOTE — Patient Instructions (Signed)

## 2014-12-12 NOTE — Progress Notes (Signed)
[facility]  PATIENT: [name] DOB: [birth year]  REFERRING CLINICIAN: [name] HISTORY FROM: Patient REASON FOR VISIT: Pain Management   HISTORICAL  CHIEF COMPLAINT:  Chief Complaint  Patient presents with  . Back Pain    Sts. he has had more numbness across lower back, in right lower leg and foot.     HISTORY OF PRESENT ILLNESS:  Mr., Hanson is a 74 yo man with pain in the mid-back.   Current pain is mild in the morning but increases by noon.   Hydrocodone usually helps reduce the pain and then it often builds up again.    In the past, he has had ESI and facet joint injections with benefit.    RFA (medial branch) was not beneficial, though.     He is not having much radiating leg pain now but still gets numbness and has a lot of swelling in the right foot.   The edema is helped by TED stockings.    Lasix helps the edema.  He reports left knee pain.   Pain is only in the knee and is increased by walking, especially stairs.     Knee injections have helped in the past x 2-3 months.     He is also on methocarbamol but only takes sparingly.    Gabapentin 300 mg po tid has helped.    REVIEW OF SYSTEMS:  Constitutional: No fevers, chills, sweats, or change in appetite Eyes: No visual changes, double vision, eye pain Ear, nose and throat: No hearing loss, ear pain, nasal congestion, sore throat Cardiovascular: No chest pain, palpitations Respiratory:  No shortness of breath at rest or with exertion.   No wheezes GastrointestinaI: No nausea, vomiting, diarrhea, abdominal pain, fecal incontinence Genitourinary:  No dysuria, urinary retention or frequency.  No nocturia. Musculoskeletal:  see above Integumentary: No rash, pruritus, skin lesions.  Notes right leg edema and has h/o vein stripping Neurological: as above Psychiatric: No depression at this time.  No anxiety Endocrine: No palpitations, diaphoresis, change in appetite, change in weigh or increased  thirst Hematologic/Lymphatic:  No anemia, purpura, petechiae. Allergic/Immunologic: No itchy/runny eyes, nasal congestion, recent allergic reactions, rashes  ALLERGIES: No Known Allergies  HOME MEDICATIONS: Outpatient Prescriptions Prior to Visit  Medication Sig Dispense Refill  . amoxicillin (AMOXIL) 500 MG capsule     . aspirin 81 MG tablet Take 81 mg by mouth daily.    [name] buPROPion (WELLBUTRIN XL) 300 MG 24 hr tablet TAKE 1 TABLET BY MOUTH EVERY MORNING 90 tablet 3  . calcium carbonate 200 MG capsule Take 250 mg by mouth daily.    . cetirizine (ZYRTEC) 10 MG chewable tablet Chew 10 mg by mouth daily.    [name] doxazosin (CARDURA) 8 MG tablet Take 1 tablet (8 mg total) by mouth daily. 90 tablet 1  . esomeprazole (NEXIUM) 20 MG packet Take 20 mg by mouth daily before breakfast.    . flurazepam (DALMANE) 15 MG capsule Take 1 capsule (15 mg total) by mouth at bedtime as needed for sleep. 30 capsule 5  . fluticasone (FLONASE) 50 MCG/ACT nasal spray Place 2 sprays into the nose daily. 16 g 6  . furosemide (LASIX) 40 MG tablet Take 1 tablet (40 mg total) by mouth daily. 90 tablet 0  . gabapentin (NEURONTIN) 300 MG capsule Take 300 mg by mouth 3 (three) times daily.    [name] glucosamine-chondroitin 500-400 MG tablet Take 1 tablet by mouth 3 (three) times daily.    [name]  HYDROcodone-acetaminophen (NORCO/VICODIN) 5-325 MG per tablet Take 1-2 tablets per day as needed for back or leg pain. 60 tablet 0  . LORazepam (ATIVAN) 1 MG tablet Take one tablet bid prn anxiety 60 tablet 2  . meloxicam (MOBIC) 15 MG tablet Take 15 mg by mouth daily.    . methocarbamol (ROBAXIN) 500 MG tablet Take 500 mg by mouth as needed for muscle spasms.    . metolazone (ZAROXOLYN) 2.5 MG tablet Take 1 tablet (2.5 mg total) by mouth daily. 90 tablet 3  . Multiple Vitamin (MULTIVITAMIN) tablet Take 1 tablet by mouth daily.    [name] omeprazole (PRILOSEC) 40 MG capsule Take 40 mg by mouth daily.    . potassium chloride SA (K-DUR,KLOR-CON) 20  MEQ tablet Take 1 tablet (20 mEq total) by mouth daily. 90 tablet 0  . testosterone cypionate (DEPOTESTOTERONE CYPIONATE) 200 MG/ML injection Inject 0.5 mLs (100 mg total) into the muscle every 7 (seven) days. 10 mL 5  . augmented betamethasone dipropionate (DIPROLENE AF) 0.05 % cream Apply topically 2 (two) times daily. (Patient not taking: Reported on 12/12/2014) 50 g 0  . Diphenhyd-Hydrocort-Nystatin (FIRST-DUKES MOUTHWASH) SUSP 1 teaspoon his rinse gargle and spit 4 times a day (Patient not taking: Reported on 12/12/2014) 120 mL 1  . doxycycline (VIBRA-TABS) 100 MG tablet Take 1 tablet (100 mg total) by mouth 2 (two) times daily. (Patient not taking: Reported on 12/12/2014) 20 tablet 0  . mupirocin ointment (BACTROBAN) 2 % Apply topically 2 (two) times daily. PATIENT NEEDS OFFICE VISIT FOR ADDITIONAL REFILLS (Patient not taking: Reported on 12/12/2014) 22 g 0  . sildenafil (REVATIO) 20 MG tablet Take 5 tablets daily as needed for erectile dysfunction. (Patient not taking: Reported on 12/12/2014) 50 tablet 11  . valACYclovir (VALTREX) 1000 MG tablet Take 1 tablet (1,000 mg total) by mouth 2 (two) times daily. (Patient not taking: Reported on 12/12/2014) 20 tablet 0   No facility-administered medications prior to visit.    PAST MEDICAL HISTORY: Past Medical History  Diagnosis Date  . 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 Sleep apnea   . 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 Allergy   . Depression   . Neuromuscular disorder   . Anxiety   . Arthritis   . Back pain     PAST SURGICAL HISTORY: Past Surgical History  Procedure Laterality Date  . Epidural steroid injections      every 6 months   Dr. Arlice Colt  . Hernia repair  2003  . Back surgery  2003    L4, L5  . Endovenous ablation saphenous vein w/ laser Right 06-01-2014    EVLA right greater saphenous vein by Curt Jews MD   . Back surgery    . Vascular surgery    . Hernia repair      FAMILY HISTORY: Family  History  Problem Relation Age of Onset  . Thyroid disease Mother   . Heart disease Father   . Diabetes Maternal Grandfather     SOCIAL HISTORY:  History   Social History  . Marital Status: Single    Spouse Name: N/A    Number of Children: N/A  . Years of Education: N/A   Occupational History  . Antiques/Real Estate    Social History Main Topics  . Smoking status: Never Smoker   . Smokeless tobacco: Not on file  . Alcohol Use: No  . Drug Use: No  . Sexual Activity: Yes  Birth Control/ Protection: Condom   Other Topics Concern  . Not on file   Social History Narrative   ** Merged History Encounter **       Single. Education: Other.      PHYSICAL EXAM  Filed Vitals:   12/12/14 1316  BP: 140/90  Resp: 16  Height: 5\' 10"  (1.778 m)  Weight: 251 lb 3.2 oz (113.944 kg)    Body mass index is 36.04 kg/(m^2).   General: The patient is well-developed and well-nourished and in no acute distress  Eyes:  Funduscopic exam shows normal optic discs and retinal vessels.  Neck: The neck is supple, no carotid bruits are noted.  The neck is nontender.  Respiratory: The respiratory examination is clear.  Cardiovascular: The cardiovascular examination reveals a regular rate and rhythm, no murmurs, gallops or rubs are noted.  Skin: Extremities are without significant edema.  Neurologic Exam  Mental status: The patient is alert and oriented x 3 at the time of the examination. The patient has apparent normal recent and remote memory, with an apparently normal attention span and concentration ability.   Speech is normal.  Cranial nerves: Extraocular movements are full. Pupils are equal, round, and reactive to light and accomodation.  Visual fields are full.  Facial symmetry is present. There is good facial sensation to soft touch bilaterally.Facial strength is normal.  Trapezius and sternocleidomastoid strength is normal. No dysarthria is noted.  The tongue is midline, and  the patient has symmetric elevation of the soft palate. No obvious hearing deficits are noted.  Motor:  Muscle bulk and tone are normal. Strength is  5 / 5 in all 4 extremities.   Sensory: Sensory testing is intact to pinprick, soft touch, vibration sensation, and position sense on all 4 extremities.  Coordination: Cerebellar testing reveals good finger-nose-finger and heel-to-shin bilaterally.  Gait and station: Station and gait are normal. Tandem gait is normal. Romberg is negative.   Reflexes: Deep tendon reflexes are symmetric and normal bilaterally. Plantar responses are normal.    DIAGNOSTIC DATA (LABS, IMAGING, TESTING) - I reviewed patient records, labs, notes, testing and imaging myself where available.  Lab Results  Component Value Date   WBC 7.0 07/21/2014   HGB 16.2 07/21/2014   HCT 50.0 07/21/2014   MCV 98.4* 07/21/2014   PLT 174 08/22/2013      Component Value Date/Time   NA 137 07/21/2014 1756   K 4.1 07/21/2014 1756   CL 101 07/21/2014 1756   CO2 25 07/21/2014 1756   GLUCOSE 86 07/21/2014 1756   BUN 21 07/21/2014 1756   CREATININE 1.47* 07/21/2014 1756   CREATININE 1.25 09/24/2009 1549   CALCIUM 9.2 07/21/2014 1756   PROT 7.0 07/21/2014 1756   ALBUMIN 4.2 07/21/2014 1756   AST 26 07/21/2014 1756   ALT 30 07/21/2014 1756   ALKPHOS 93 07/21/2014 1756   BILITOT 0.6 07/21/2014 1756   GFRNONAA 48* 12/19/2013 1813   GFRNONAA 57* 09/24/2009 1549   GFRAA 55* 12/19/2013 1813   GFRAA  09/24/2009 1549    >60        The eGFR has been calculated using the MDRD equation. This calculation has not been validated in all clinical situations. eGFR's persistently <60 mL/min signify possible Chronic Kidney Disease.   Lab Results  Component Value Date   CHOL 154 08/22/2013   HDL 36* 08/22/2013   LDLCALC 91 08/22/2013   TRIG 135 08/22/2013   CHOLHDL 4.3 08/22/2013   Lab Results  Component  Value Date   HGBA1C 5.6 08/03/2013   No results found for:  VITAMINB12 Lab Results  Component Value Date   TSH 1.684 07/21/2014   No components found for: VITAMIND     ASSESSMENT AND PLAN  Lumbar radiculopathy - Plan: Ambulatory referral to Neurosurgery  Facet hypertrophy of lumbar region - Plan: Ambulatory referral to Neurosurgery  Left knee pain  Left-sided thoracic back pain - Plan: HYDROcodone-acetaminophen (NORCO/VICODIN) 5-325 MG per tablet   In summary, Mr. Mansel is a 74 year old man with back pain that is likely due to facet hypertrophy who also has  occasional lumbar radiculopathy and left knee pain.   He transferred his care to me at [facility] neurologic. Her, I am no longer doing pain management procedures and we discussed that he might prefer to be followed elsewhere for the pain management. He had seen Dr. Carloyn Manner at [facility] in the past and would like to follow-up with pain management there. We will make a referral for that. In the interim, I will renew his medicines additionally, a recurrence of the left knee pain that responded to an injection in the past. Using sterile technique 40 mg of Depo-Medrol in [place] was injected he tolerated the procedure well  Mr. Kappes  will follow up as needed and is advised to call if he has new or worsening symptoms before he is able to get established elsewhere.  Jimel A. Felecia Shelling, MD, PhD 06/15/3816, 7:11 PM Certified in Neurology, Clinical Neurophysiology, Sleep Medicine, Pain Medicine and Neuroimaging  [facility] [address] [place], [place] [address] [phone]

## 2014-12-13 ENCOUNTER — Other Ambulatory Visit: Payer: Self-pay

## 2014-12-13 MED ORDER — LORAZEPAM 1 MG PO TABS: ORAL_TABLET | ORAL | Status: DC

## 2014-12-13 NOTE — Telephone Encounter (Signed)
Called in.

## 2014-12-13 NOTE — Telephone Encounter (Signed)
Pharm reqs RF of lorazepam. Pended.

## 2014-12-16 ENCOUNTER — Ambulatory Visit (INDEPENDENT_AMBULATORY_CARE_PROVIDER_SITE_OTHER): Payer: Medicare Other

## 2014-12-16 ENCOUNTER — Ambulatory Visit (INDEPENDENT_AMBULATORY_CARE_PROVIDER_SITE_OTHER): Payer: Medicare Other | Admitting: Internal Medicine

## 2014-12-16 VITALS — BP 158/88 | HR 73 | Temp 97.7°F | Resp 19 | Ht 68.5 in | Wt 244.6 lb

## 2014-12-16 DIAGNOSIS — E291 Testicular hypofunction: Secondary | ICD-10-CM

## 2014-12-16 DIAGNOSIS — M47816 Spondylosis without myelopathy or radiculopathy, lumbar region: Secondary | ICD-10-CM | POA: Diagnosis not present

## 2014-12-16 DIAGNOSIS — L97919 Non-pressure chronic ulcer of unspecified part of right lower leg with unspecified severity: Secondary | ICD-10-CM

## 2014-12-16 DIAGNOSIS — E785 Hyperlipidemia, unspecified: Secondary | ICD-10-CM

## 2014-12-16 DIAGNOSIS — N429 Disorder of prostate, unspecified: Secondary | ICD-10-CM | POA: Diagnosis not present

## 2014-12-16 DIAGNOSIS — M47812 Spondylosis without myelopathy or radiculopathy, cervical region: Secondary | ICD-10-CM | POA: Diagnosis not present

## 2014-12-16 DIAGNOSIS — R609 Edema, unspecified: Secondary | ICD-10-CM

## 2014-12-16 DIAGNOSIS — R0600 Dyspnea, unspecified: Secondary | ICD-10-CM

## 2014-12-16 DIAGNOSIS — G4730 Sleep apnea, unspecified: Secondary | ICD-10-CM | POA: Diagnosis not present

## 2014-12-16 DIAGNOSIS — R0609 Other forms of dyspnea: Secondary | ICD-10-CM | POA: Diagnosis not present

## 2014-12-16 DIAGNOSIS — M47896 Other spondylosis, lumbar region: Secondary | ICD-10-CM

## 2014-12-16 DIAGNOSIS — R5383 Other fatigue: Secondary | ICD-10-CM

## 2014-12-16 DIAGNOSIS — N289 Disorder of kidney and ureter, unspecified: Secondary | ICD-10-CM

## 2014-12-16 MED ORDER — DOXYCYCLINE HYCLATE 100 MG PO TABS: 100.0000 mg | ORAL_TABLET | Freq: Two times a day (BID) | ORAL | Status: DC

## 2014-12-16 NOTE — Progress Notes (Addendum)
Subjective:  This chart was scribed for [name] Lin, MD by Dellis Filbert, ED Scribe at [facility].The patient was seen in exam room 02 and the patient's care was started at 3:08 PM.   Patient ID: [name], male    DOB: 19-Apr-1941, 74 y.o.   MRN: [id number] Chief Complaint  Patient presents with  . Leg Swelling    beleives place on shin is re-infected, within last week  . Shortness of Breath    last 1-2 weeks, worseing   HPI HPI Comments: PLEAS CARNEAL is a 74 y.o. male who presents to [facility] complaining of leg swelling, onset 2 weeks ago he believes a blister on his right shin is re-infected, within last week. Had a vein removed in his right leg which left him with a blister. Pt states that last night the blister was sore. The pain is primarily at the sight of the blister. Pt does have some drainage from the site of the sore. Pt states the way he applies his band-aid has improved his drainage.  He has less drainage the last couple days. He takes zinc oxide and mupirocin for relief. In the past he has taken an oral antibiotic due to an infection. Pt does have a history of intermittent leg swelling. He takes potassium supplements daily. Pt has been to [place] for wound care center, for similar complaints.  Pt reports being very exhausted and fatigued for the past few weeks. He does have SOB with activity. Pt states he cannot eat a full meal due to indigestion, this gives him intermittent abdominal pain. Notes weight gain due to exhaustion and a new project he is working on which keeps him sitting in front of his computer. He denies diarrhea and constipation. Last colonoscopy was 7-8 years ago. He gets testosterone once every 3 weeks. Pt states that his energy level varies from the time he gets his shot, towards the end of the 3 weeks pt notices his decrease in energy. Pt has noticed some resting tremors in both hands.  Patient Active Problem List   Diagnosis Date  Noted  . Lumbar radiculopathy 12/12/2014  . Facet hypertrophy of lumbar region 12/12/2014  . Left knee pain 12/12/2014  . Ulcer of lower limb, unspecified 01/10/2014  . Varicose veins of lower extremities with other complications 54/56/2563  . 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 Renal insufficiency 12/21/2013  . Swelling in head/neck 07/28/2012  . 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 Pain in limb 02/12/2012  . HYPERLIPIDEMIA 02/28/2008  . ALLERGIC RHINITIS 02/28/2008  . SLEEP APNEA 02/28/2008   Past Medical History  Diagnosis Date  . 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 Sleep apnea   . 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 Allergy   . Depression   . Neuromuscular disorder   . Anxiety   . Arthritis   . Back pain    Past Surgical History  Procedure Laterality Date  . Epidural steroid injections      every 6 months   Dr. Arlice Colt  . Hernia repair  2003  . Back surgery  2003    L4, L5  . Endovenous ablation saphenous vein w/ laser Right 06-01-2014    EVLA right greater saphenous vein by Curt Jews MD   . Back surgery    . Vascular surgery    .  Hernia repair     No Known Allergies Prior to Admission medications   Medication Sig Start Date End Date Taking? Authorizing Provider  amoxicillin (AMOXIL) 500 MG capsule  12/19/13  Yes Historical Provider, MD  aspirin 81 MG tablet Take 81 mg by mouth daily.   Yes Historical Provider, MD  augmented betamethasone dipropionate (DIPROLENE AF) 0.05 % cream Apply topically 2 (two) times daily. 07/21/14  Yes [name], MD  buPROPion (WELLBUTRIN XL) 300 MG 24 hr tablet TAKE 1 TABLET BY MOUTH EVERY MORNING 11/23/13  Yes [name], MD  calcium carbonate 200 MG capsule Take 250 mg by mouth daily.   Yes Historical Provider, MD  cetirizine (ZYRTEC) 10 MG chewable tablet Chew 10 mg by mouth daily.   Yes Historical  Provider, MD  Diphenhyd-Hydrocort-Nystatin (FIRST-DUKES MOUTHWASH) SUSP 1 teaspoon his rinse gargle and spit 4 times a day 07/30/12  Yes [name], MD  doxazosin (CARDURA) 8 MG tablet Take 1 tablet (8 mg total) by mouth daily. 07/11/14  Yes [name], MD  doxycycline (VIBRA-TABS) 100 MG tablet Take 1 tablet (100 mg total) by mouth 2 (two) times daily. 04/19/14  Yes [name], MD  esomeprazole (NEXIUM) 20 MG packet Take 20 mg by mouth daily before breakfast.   Yes Historical Provider, MD  flurazepam (DALMANE) 15 MG capsule Take 1 capsule (15 mg total) by mouth at bedtime as needed for sleep. 08/12/12  Yes [name], MD  fluticasone (FLONASE) 50 MCG/ACT nasal spray Place 2 sprays into the nose daily. 07/28/12  Yes Lyndal Pulley, DO  furosemide (LASIX) 40 MG tablet Take 1 tablet (40 mg total) by mouth daily. 08/03/14  Yes Mancel Bale, PA-C  gabapentin (NEURONTIN) 300 MG capsule Take 1 capsule (300 mg total) by mouth 3 (three) times daily. 12/12/14  Yes Kaniel A. Sater, MD  glucosamine-chondroitin 500-400 MG tablet Take 1 tablet by mouth 3 (three) times daily.   Yes Historical Provider, MD  HYDROcodone-acetaminophen (NORCO/VICODIN) 5-325 MG per tablet Take 2 - 3  tablets per day as needed for back or leg pain. 12/12/14  Yes Andrew A. Sater, MD  LORazepam (ATIVAN) 1 MG tablet Take one tablet bid prn anxiety 12/13/14  Yes [name], MD  meloxicam (MOBIC) 15 MG tablet Take 15 mg by mouth daily.   Yes Historical Provider, MD  methocarbamol (ROBAXIN) 500 MG tablet Take 500 mg by mouth as needed for muscle spasms.   Yes Historical Provider, MD  metolazone (ZAROXOLYN) 2.5 MG tablet Take 1 tablet (2.5 mg total) by mouth daily. PATIENT NEEDS OFFICE VISIT FOR ADDITIONAL REFILLS 12/13/14  Yes [name], MD  Multiple Vitamin (MULTIVITAMIN) tablet Take 1 tablet by mouth daily.   Yes Historical Provider, MD  mupirocin ointment (BACTROBAN) 2 % Apply topically 2 (two) times daily. PATIENT NEEDS  OFFICE VISIT FOR ADDITIONAL REFILLS 12/02/13  Yes Mancel Bale, PA-C  omeprazole (PRILOSEC) 40 MG capsule Take 40 mg by mouth daily.   Yes Historical Provider, MD  potassium chloride SA (K-DUR,KLOR-CON) 20 MEQ tablet Take 1 tablet (20 mEq total) by mouth daily. 08/03/14  Yes Mancel Bale, PA-C  sildenafil (REVATIO) 20 MG tablet Take 5 tablets daily as needed for erectile dysfunction. 04/13/14  Yes [name], MD  testosterone cypionate (DEPOTESTOTERONE CYPIONATE) 200 MG/ML injection Inject 0.5 mLs (100 mg total) into the muscle every 7 (seven) days. 08/22/14  Yes [name], MD  valACYclovir (VALTREX) 1000 MG  tablet Take 1 tablet (1,000 mg total) by mouth 2 (two) times daily. 07/30/12  Yes [name], MD   Review of Systems  Constitutional: Positive for fatigue and unexpected weight change.  Respiratory: Positive for shortness of breath.   Cardiovascular: Positive for leg swelling.  Gastrointestinal: Positive for abdominal pain. Negative for diarrhea and constipation.  Skin: Positive for wound.       Objective:  BP 158/88 mmHg  Pulse 73  Temp(Src) 97.7 F (36.5 C) (Oral)  Resp 19  Ht 5' 8.5" (1.74 m)  Wt 244 lb 9.6 oz (110.95 kg)  BMI 36.65 kg/m2  SpO2 95%  Physical Exam  Constitutional: He is oriented to person, place, and time. He appears well-developed and well-nourished. No distress.  HENT:  Head: Normocephalic and atraumatic.  Eyes: Pupils are equal, round, and reactive to light.  Neck: Normal range of motion.  Cardiovascular: Normal rate and regular rhythm.   Pulmonary/Chest: Effort normal. No respiratory distress.  Musculoskeletal: Normal range of motion. He exhibits no edema.  His vascular ulcer on the right lower extremity has purulence centrally with 2 cm x 3 cm redness but without induration or extensive erythema or tenderness Distal pulses are full  Neurological: He is alert and oriented to person, place, and time.  Skin: Skin is warm and dry.    Psychiatric: He has a normal mood and affect. His behavior is normal.  Nursing note and vitals reviewed.  [facility] reading (PRIMARY) by  Dr. Laney Pastor no acute changes. 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 Jeffie Pollock this week but their office is not set up to do weekly  Injections----see the information below  Renal insufficiency - Plan: Comprehensive metabolic panel  Facet hypertrophy of lumbar region - Plan: Ambulatory referral to Orthopedic Surgery to continue physiatry previously provided  by Dr.Sater  DOE (dyspnea on exertion) - Plan: DG Chest 2 View, TSH, Ambulatory referral to Cardiology--- he needs to have a stress test to  rule out coronary artery disease as a cause of his recent increase in fatigue and shortness of breath with activity  Other fatigue - Plan: TSH  Edema - Plan: TSH  Disorder of prostate - Plan: PSA must be followed because of testosterone therapy   Meds ordered this encounter  Medications  . doxycycline (VIBRA-TABS) 100 MG tablet    Sig: Take 1 tablet (100 mg total) by mouth 2 (two) times daily.    Dispense:  20 tablet    Refill:  0     When should multi-dose vials be discarded? CDC: Medication vials should always be discarded whenever sterility is compromised or questionable. In addition, the [place]  Pharmacopeia (USP) General Chapter 797 [16] recommends the following for multi-dose vials of sterile pharmaceuticals:  If a multi-dose has been opened or accessed (e.g., needle-punctured) the vial should be dated and discarded within 55  days unless the manufacturer specifies a different (shorter or longer) date for that opened vial.(HIS VIAL SAYS GOOD FOR 3 MONTHs  This interpretation would make it seem like a single user with a multidose vial can go by the instructions on the medication bottle and not have to adhere to a 28 day limit which would be imposed on our practice if we were keeping this vial and using it for multiple patients--- [facility] to see if they agree that I see no reason he should have to change systolic use use for many years(I'll have [facility] call)    55 minute ov-->50% direct consultation I have completed the patient encounter in its entirety as documented by the scribe, with editing by me where necessary. Tammara Massing P. Laney Pastor, M.D.

## 2014-12-16 NOTE — Patient Instructions (Signed)
When should multi-dose vials be discarded? CDC Medication vials should always be discarded whenever sterility is compromised or questionable. In addition, the [place] Pharmacopeia (USP) General Chapter 797 [16] recommends the following for multi-dose vials of sterile pharmaceuticals:  If a multi-dose has been opened or accessed (e.g., needle-punctured) the vial should be dated and discarded within 28 days unless the manufacturer specifies a different (shorter or longer) date for that opened vial.(HIS VIAL SAYS GOOD FOR 3 MONTHS)

## 2014-12-17 LAB — COMPREHENSIVE METABOLIC PANEL
ALT: 32 U/L (ref 0–53)
AST: 27 U/L (ref 0–37)
Albumin: 3.9 g/dL (ref 3.5–5.2)
Alkaline Phosphatase: 83 U/L (ref 39–117)
BUN: 19 mg/dL (ref 6–23)
CHLORIDE: 102 meq/L (ref 96–112)
CO2: 27 meq/L (ref 19–32)
CREATININE: 1.37 mg/dL — AB (ref 0.50–1.35)
Calcium: 9.3 mg/dL (ref 8.4–10.5)
Glucose, Bld: 98 mg/dL (ref 70–99)
Potassium: 4 mEq/L (ref 3.5–5.3)
SODIUM: 137 meq/L (ref 135–145)
Total Bilirubin: 0.7 mg/dL (ref 0.2–1.2)
Total Protein: 6.8 g/dL (ref 6.0–8.3)

## 2014-12-17 LAB — CBC WITH DIFFERENTIAL/PLATELET
BASOS ABS: 0 10*3/uL (ref 0.0–0.1)
Basophils Relative: 0 % (ref 0–1)
EOS ABS: 0.3 10*3/uL (ref 0.0–0.7)
Eosinophils Relative: 4 % (ref 0–5)
HEMATOCRIT: 44.3 % (ref 39.0–52.0)
Hemoglobin: 15.4 g/dL (ref 13.0–17.0)
LYMPHS ABS: 1.4 10*3/uL (ref 0.7–4.0)
Lymphocytes Relative: 20 % (ref 12–46)
MCH: 32.6 pg (ref 26.0–34.0)
MCHC: 34.8 g/dL (ref 30.0–36.0)
MCV: 93.9 fL (ref 78.0–100.0)
MONOS PCT: 8 % (ref 3–12)
MPV: 10 fL (ref 8.6–12.4)
Monocytes Absolute: 0.6 10*3/uL (ref 0.1–1.0)
Neutro Abs: 4.8 10*3/uL (ref 1.7–7.7)
Neutrophils Relative %: 68 % (ref 43–77)
Platelets: 163 10*3/uL (ref 150–400)
RBC: 4.72 MIL/uL (ref 4.22–5.81)
RDW: 14.1 % (ref 11.5–15.5)
WBC: 7 10*3/uL (ref 4.0–10.5)

## 2014-12-17 LAB — LIPID PANEL
CHOL/HDL RATIO: 4.9 ratio
CHOLESTEROL: 178 mg/dL (ref 0–200)
HDL: 36 mg/dL — AB (ref >39)
LDL Cholesterol: 83 mg/dL (ref 0–99)
Triglycerides: 297 mg/dL — ABNORMAL HIGH (ref <150)
VLDL: 59 mg/dL — AB (ref 0–40)

## 2014-12-17 LAB — TESTOSTERONE: Testosterone: 323 ng/dL (ref 300–890)

## 2014-12-17 LAB — TSH: TSH: 1.763 u[IU]/mL (ref 0.350–4.500)

## 2014-12-18 DIAGNOSIS — N201 Calculus of ureter: Secondary | ICD-10-CM | POA: Diagnosis not present

## 2014-12-18 LAB — PSA: PSA: 3.14 ng/mL (ref <=4.00)

## 2014-12-19 ENCOUNTER — Encounter: Payer: Self-pay | Admitting: Internal Medicine

## 2014-12-19 NOTE — Progress Notes (Signed)
I spoke to Richardson Landry, pharmacist w/[facility] out[facility]. He stated that he is most comfortable following the 28 day CDC rule, BUT if the label on the vial is a MANUFACTURER'S label, that advises that the vial can be used for up to 3 mos, he would have to agree that the pt could do so. He stated that he has many pt's using insulin vials for that long, but if the label was put on the bottle by the pharmacy and not the manufacturer, he would follow the CDC guidelines.

## 2014-12-25 ENCOUNTER — Other Ambulatory Visit: Payer: Self-pay | Admitting: Emergency Medicine

## 2014-12-25 ENCOUNTER — Telehealth: Payer: Self-pay

## 2014-12-25 ENCOUNTER — Ambulatory Visit (INDEPENDENT_AMBULATORY_CARE_PROVIDER_SITE_OTHER): Payer: Medicare Other

## 2014-12-25 DIAGNOSIS — E291 Testicular hypofunction: Secondary | ICD-10-CM

## 2014-12-25 DIAGNOSIS — E876 Hypokalemia: Secondary | ICD-10-CM

## 2014-12-25 MED ORDER — TESTOSTERONE CYPIONATE 200 MG/ML IM SOLN
200.0000 mg | Freq: Once | INTRAMUSCULAR | Status: AC
  Administered 2014-12-25: 200 mg via INTRAMUSCULAR

## 2014-12-25 NOTE — Telephone Encounter (Signed)
Pt states he takes 2 potassium a day,so his refill ran out faster,that needs to be addressed with [facility], He also states he needs refill on Diprolene and his most recent antibiotic   Best phone for pt is [phone]

## 2014-12-26 ENCOUNTER — Institutional Professional Consult (permissible substitution): Payer: Medicare Other | Admitting: Neurology

## 2014-12-26 NOTE — Telephone Encounter (Signed)
I was substituting for Dr. Delsa Sale him this request please

## 2014-12-26 NOTE — Telephone Encounter (Signed)
Dr Laney Pastor, you just saw pt for many chronic issues, but don't see this med discussed. OK to give RFs?

## 2014-12-27 ENCOUNTER — Encounter: Payer: Self-pay | Admitting: Neurology

## 2014-12-27 NOTE — Telephone Encounter (Signed)
Dr Everlene Farrier, I'm not sure whether to send this to you or Dr Laney Pastor who saw pt last, but will send to you since you are PCP and pt seems to be rather complicated. Your notes at 07/21/14 say that pt is taking 1 K+ tab daily and then lab notes from that visit advise to stay at that dose. Med list at last OV showed pt at 1 tab daily. Do you want him taking 2 tabs? Also please advise on Diprolene and Abx. Pended Rxs as last written.

## 2014-12-28 MED ORDER — DOXYCYCLINE HYCLATE 100 MG PO TABS: 100.0000 mg | ORAL_TABLET | Freq: Two times a day (BID) | ORAL | Status: DC

## 2014-12-28 MED ORDER — POTASSIUM CHLORIDE CRYS ER 20 MEQ PO TBCR: 40.0000 meq | EXTENDED_RELEASE_TABLET | Freq: Every day | ORAL | Status: DC

## 2014-12-28 MED ORDER — BETAMETHASONE DIPROPIONATE AUG 0.05 % EX CREA
TOPICAL_CREAM | Freq: Two times a day (BID) | CUTANEOUS | Status: DC
Start: 2014-12-28 — End: 2015-05-22

## 2014-12-28 NOTE — Telephone Encounter (Signed)
Left message for pt to call back  °

## 2014-12-28 NOTE — Telephone Encounter (Signed)
RFs were sent and lab ordered. Waiting for pt CB to inform/instruct.

## 2014-12-28 NOTE — Telephone Encounter (Signed)
Dr Everlene Farrier, do you want to RF this?

## 2014-12-28 NOTE — Telephone Encounter (Signed)
Okay to fill the Diprolene. Okay to refill his doxycycline. His last potassium level was normal and he needs to stay on the dose he has been taking. Put in a future order for a b met to be done in about a month

## 2014-12-29 NOTE — Telephone Encounter (Signed)
His labs are stable he has some degree of renal dysfunction which has been stable over the last year. His triglycerides are high with a low HDL which will improve with weight loss. His PSA and testosterone levels are normal

## 2014-12-29 NOTE — Telephone Encounter (Signed)
Spoke with [name], advised message from Dr. Everlene Farrier and advised him to come in for a blood draw. [name] agreed. Dr. Everlene Farrier can you review his labs and advise. [name] asked about these but I did not see any comments on the labs.

## 2015-01-02 ENCOUNTER — Ambulatory Visit: Payer: Medicare Other

## 2015-01-02 DIAGNOSIS — E291 Testicular hypofunction: Secondary | ICD-10-CM

## 2015-01-03 ENCOUNTER — Other Ambulatory Visit: Payer: Self-pay | Admitting: Family Medicine

## 2015-01-03 DIAGNOSIS — E291 Testicular hypofunction: Secondary | ICD-10-CM

## 2015-01-03 MED ORDER — TESTOSTERONE CYPIONATE 100 MG/ML IM SOLN: 100.0000 mg | INTRAMUSCULAR | Status: DC

## 2015-01-03 NOTE — Telephone Encounter (Signed)
New RX testosterone cypionate 100mg /ml inject 1 ml into the muscle q 7 days. Patient notified and voiced understanding.

## 2015-01-04 ENCOUNTER — Ambulatory Visit (INDEPENDENT_AMBULATORY_CARE_PROVIDER_SITE_OTHER): Payer: Medicare Other

## 2015-01-04 DIAGNOSIS — E291 Testicular hypofunction: Secondary | ICD-10-CM

## 2015-01-04 MED ORDER — TESTOSTERONE CYPIONATE 200 MG/ML IM SOLN
200.0000 mg | Freq: Once | INTRAMUSCULAR | Status: AC
  Administered 2015-01-04: 200 mg via INTRAMUSCULAR

## 2015-01-13 ENCOUNTER — Other Ambulatory Visit: Payer: Self-pay | Admitting: Emergency Medicine

## 2015-01-13 ENCOUNTER — Telehealth: Payer: Self-pay

## 2015-01-13 NOTE — Telephone Encounter (Signed)
The patient called to request refill of doxycycline called into [facility] on [address].  The patient may be reached at [phone] to discuss.

## 2015-01-15 ENCOUNTER — Ambulatory Visit (INDEPENDENT_AMBULATORY_CARE_PROVIDER_SITE_OTHER): Payer: Medicare Other | Admitting: Physician Assistant

## 2015-01-15 DIAGNOSIS — E291 Testicular hypofunction: Secondary | ICD-10-CM

## 2015-01-15 MED ORDER — TESTOSTERONE CYPIONATE 100 MG/ML IM SOLN
100.0000 mg | INTRAMUSCULAR | Status: DC
  Administered 2015-01-15 – 2015-02-21 (×3): 100 mg via INTRAMUSCULAR

## 2015-01-15 NOTE — Progress Notes (Signed)
Patient ID: [name], male   DOB: 1941-09-21, 74 y.o.   MRN: [id number] Presents for testosterone injection and training for self-injection.  There is an issue regarding how long the medication is safe to use once the vial is initially accessed. The CDC web site states 28 days.  The patient reports that he has done additional research, read the actual studies and called the CDC, and that the 28-day expiration is actually arbitrary, and only applies to multi-use vials used for multiple patients.  He also reports that at two other offices in town he's been told that the 28-day rule doesn't apply and that they do not follow it.  I agree to do some additional investigating, but for now we agree that the best thing to do is to teach him how to self inject. We still advise that he follow whatever guidelines the CDC recommends, but cannot prevent him from self-injecting after the expiration date.  Patient trained on sterile preparation, drawing up and self-injecting into the upper outer thigh. He was observed to perform the tasks correctly.  Advised that he needs a Sharps container to discard the used needles and that he can obtain one at his pharmacy.

## 2015-01-15 NOTE — Telephone Encounter (Signed)
This request is in the Rx refill pool and was sent to Dr. Everlene Farrier.

## 2015-01-15 NOTE — Telephone Encounter (Signed)
Do you want to RF again? Do you want to put more than one RF if he stays on this chronically?

## 2015-01-16 NOTE — Telephone Encounter (Signed)
Call patient and clarify why he is on the doxycycline and how he takes the medication. Also clarify when he first started on the doxycycline

## 2015-01-24 ENCOUNTER — Ambulatory Visit: Payer: Medicare Other | Admitting: *Deleted

## 2015-01-24 DIAGNOSIS — E349 Endocrine disorder, unspecified: Secondary | ICD-10-CM

## 2015-01-24 DIAGNOSIS — E291 Testicular hypofunction: Secondary | ICD-10-CM | POA: Diagnosis not present

## 2015-01-24 NOTE — Progress Notes (Unsigned)
   Subjective:    Patient ID: [name], male    DOB: 07-Feb-1941, 74 y.o.   MRN: [id number]  HPI Pt came in today for his Depo-Testosterone injection.  Pt last received injection by Harrison Mons, PA-C on 01/15/2015.  He was given 100mg /ml (59ml) injection in the LUOQ.  The bottle that was used was recently opened on 01/15/2015.  Pt states that he will start self-injections after this new bottle 28 days is up.\  This injection was given by Nadara Mustard, CMA  Review of Systems     Objective:   Physical Exam        Assessment & Plan:

## 2015-01-26 ENCOUNTER — Other Ambulatory Visit: Payer: Self-pay | Admitting: Internal Medicine

## 2015-01-26 NOTE — Telephone Encounter (Signed)
The original message for this refill request was put in on March 7 and the patient still has not heard from [place] about this.  He is needing this for the leg infection he has had before.  [phone]

## 2015-01-26 NOTE — Telephone Encounter (Signed)
Doxycycline 100mg  1 BID #20 for leg infection.

## 2015-01-29 NOTE — Telephone Encounter (Signed)
Spoke to  Pt, he states was first prescribed doxy by dr Everlene Farrier apx 3 months ago. Then prescribed  apx 3-4 weeks  By dr Laney Pastor for the infection on his leg. i informed him a refill was sent to his pharm 01/26/2015

## 2015-01-29 NOTE — Telephone Encounter (Signed)
Dr Everlene Farrier, pt was just in for check up in Feb, but I don't see HTN addressed. Do you want to OK RFs or RTC for HTN follow up?

## 2015-01-30 ENCOUNTER — Telehealth: Payer: Self-pay | Admitting: Neurology

## 2015-01-30 MED ORDER — GABAPENTIN 300 MG PO CAPS: 300.0000 mg | ORAL_CAPSULE | Freq: Three times a day (TID) | ORAL | Status: DC

## 2015-01-30 NOTE — Telephone Encounter (Addendum)
Patient is calling about Rx Gabapentin 300 mg.  He states he received a written 30 day prescription(1 tablet 3 times a day) February when he was here from Dr. Felecia Shelling and that Dr Felecia Shelling was going to refill for 90 days once his medical records were received here.  He is out and would like to pick up this afternoon.  Patient would like to use [phone].   Please call.

## 2015-01-30 NOTE — Telephone Encounter (Signed)
Per RAS, ok for 90 day supply of Gabapentin.  I have escribed this to [facility], and I spoke with Delfino Lovett and let him know this has been done/fim

## 2015-01-31 ENCOUNTER — Encounter: Payer: Self-pay | Admitting: Cardiovascular Disease

## 2015-01-31 ENCOUNTER — Ambulatory Visit (INDEPENDENT_AMBULATORY_CARE_PROVIDER_SITE_OTHER): Payer: Medicare Other | Admitting: Cardiovascular Disease

## 2015-01-31 ENCOUNTER — Ambulatory Visit: Payer: Medicare Other

## 2015-01-31 VITALS — BP 126/82 | HR 63 | Ht 70.0 in | Wt 249.0 lb

## 2015-01-31 DIAGNOSIS — R0609 Other forms of dyspnea: Secondary | ICD-10-CM | POA: Diagnosis not present

## 2015-01-31 DIAGNOSIS — R0600 Dyspnea, unspecified: Secondary | ICD-10-CM

## 2015-01-31 NOTE — Patient Instructions (Signed)
Your physician has requested that you have an echocardiogram. Echocardiography is a painless test that uses sound waves to create images of your heart. It provides your doctor with information about the size and shape of your heart and how well your heart's chambers and valves are working. This procedure takes approximately one hour. There are no restrictions for this procedure.  Your physician recommends that you schedule a follow-up appointment if echo is abnormal otherwise appointment on an as needed basis  No other changes

## 2015-01-31 NOTE — Assessment & Plan Note (Signed)
Patient complains of fatigue and dyspnea on exertion. I last saw him 8 years ago. He has gained 40 pounds. I performed cardiac catheterization on him 06/17/07 which is entirely normal. He denies chest pain. I doubt whether his dyspnea is cardiac in nature although I am going to get a 2-D echo to define his LV function. If this is normal, I suggested weight reduction and I will see him back when necessary.

## 2015-01-31 NOTE — Progress Notes (Signed)
01/31/2015 [place]   [birth year]  [id number]  Primary Physician DAUB, Lina Sayre, MD Primary Cardiologist: Lorretta Harp MD Renae Gloss   HPI:  [name] is a 74 year old moderately overweight divorced Caucasian male father of 3 living children (1 daughter committed suicide since I saw him last) who is a patient of Dr. Everlene Farrier and was referred back for evaluation of dyspnea. I last saw him in the office 06/23/07. His cardiac risk factor profile is notable for mild hyperlipidemia and family history the father who had an open heart surgery in his 56s. Because of chest pain and a mildly abnormal Cardiolite I performed cardiac catheterization on him 8//7/08 which was entirely normal. He does have problems with his back and knee. He's had venous stripping by Dr. Donnetta Hutching for reflux and swelling last year. He's been diagnosed with obstructive sleep apnea where C Pap which is beneficial for him. He has gained 40 pounds since I last saw him. He does not exercise because of physical limitations regarding his back and knees.    Current Outpatient Prescriptions  Medication Sig Dispense Refill  . aspirin 81 MG tablet Take 81 mg by mouth daily.    [name] augmented betamethasone dipropionate (DIPROLENE AF) 0.05 % cream Apply topically 2 (two) times daily. 50 g 0  . buPROPion (WELLBUTRIN XL) 300 MG 24 hr tablet TAKE 1 TABLET BY MOUTH EVERY MORNING 90 tablet 1  . calcium carbonate 200 MG capsule Take 250 mg by mouth daily.    . cetirizine (ZYRTEC) 10 MG chewable tablet Chew 10 mg by mouth daily.    [name] doxazosin (CARDURA) 8 MG tablet TAKE 1 TABLET BY MOUTH ONCE A DAY 90 tablet 1  . doxycycline (VIBRA-TABS) 100 MG tablet TAKE 1 TABLET BY MOUTH 2 TIMES DAILY. 20 tablet 0  . esomeprazole (NEXIUM) 20 MG packet Take 20 mg by mouth daily before breakfast.    . flurazepam (DALMANE) 15 MG capsule Take 1 capsule (15 mg total) by mouth at bedtime as needed for sleep. 30 capsule 5  . fluticasone (FLONASE) 50  MCG/ACT nasal spray Place 2 sprays into the nose daily. 16 g 6  . furosemide (LASIX) 40 MG tablet Take 1 tablet (40 mg total) by mouth daily. 90 tablet 0  . gabapentin (NEURONTIN) 300 MG capsule Take 1 capsule (300 mg total) by mouth 3 (three) times daily. 270 capsule 1  . glucosamine-chondroitin 500-400 MG tablet Take 1 tablet by mouth 3 (three) times daily.    [name] HYDROcodone-acetaminophen (NORCO/VICODIN) 5-325 MG per tablet Take 2 - 3  tablets per day as needed for back or leg pain. 90 tablet 0  . LORazepam (ATIVAN) 1 MG tablet Take one tablet bid prn anxiety 60 tablet 1  . meloxicam (MOBIC) 15 MG tablet Take 15 mg by mouth daily.    . methocarbamol (ROBAXIN) 500 MG tablet Take 500 mg by mouth as needed for muscle spasms.    . Multiple Vitamin (MULTIVITAMIN) tablet Take 1 tablet by mouth daily.    . mupirocin ointment (BACTROBAN) 2 % Apply topically 2 (two) times daily. PATIENT NEEDS OFFICE VISIT FOR ADDITIONAL REFILLS 22 g 0  . omeprazole (PRILOSEC) 40 MG capsule Take 40 mg by mouth daily.    . potassium chloride SA (K-DUR,KLOR-CON) 20 MEQ tablet Take 2 tablets (40 mEq total) by mouth daily. 180 tablet 0  . sildenafil (REVATIO) 20 MG tablet Take 5 tablets daily as needed for erectile dysfunction. 50 tablet 11  .  testosterone cypionate (DEPOTESTOTERONE CYPIONATE) 100 MG/ML injection Inject 1 mL (100 mg total) into the muscle every 7 (seven) days. For IM use only 10 mL 0   Current Facility-Administered Medications  Medication Dose Route Frequency Provider Last Rate Last Dose  . testosterone cypionate (DEPOTESTOTERONE CYPIONATE) injection 100 mg  100 mg Intramuscular Q7 days Fara Chute, PA-C   100 mg at 01/24/15 1917    No Known Allergies  History   Social History  . Marital Status: Single    Spouse Name: N/A  . Number of Children: N/A  . Years of Education: N/A   Occupational History  . Antiques/Real Estate    Social History Main Topics  . Smoking status: Never Smoker   .  Smokeless tobacco: Not on file  . Alcohol Use: No  . Drug Use: No  . Sexual Activity: Yes    Birth Control/ Protection: Condom   Other Topics Concern  . Not on file   Social History Narrative   ** Merged History Encounter **       Single. Education: Other.      Review of Systems: General: negative for chills, fever, night sweats or weight changes.  Cardiovascular: negative for chest pain, dyspnea on exertion, edema, orthopnea, palpitations, paroxysmal nocturnal dyspnea or shortness of breath Dermatological: negative for rash Respiratory: negative for cough or wheezing Urologic: negative for hematuria Abdominal: negative for nausea, vomiting, diarrhea, bright red blood per rectum, melena, or hematemesis Neurologic: negative for visual changes, syncope, or dizziness All other systems reviewed and are otherwise negative except as noted above.    Blood pressure 126/82, pulse 63, height 5\' 10"  (1.778 m), weight 249 lb (112.946 kg).  General appearance: alert and no distress Neck: no adenopathy, no carotid bruit, no JVD, supple, symmetrical, trachea midline and thyroid not enlarged, symmetric, no tenderness/mass/nodules Lungs: clear to auscultation bilaterally Heart: regular rate and rhythm, S1, S2 normal, no murmur, click, rub or gallop Extremities: 2+ edema on the right with a compression stocking  EKG normal sinus rhythm at 63 without ST or T-wave changes. I personally reviewed this EKG  ASSESSMENT AND PLAN:   Hyperlipidemia History of hyperlipidemia not on statin therapy with this measures a lipid profile performed 12/16/14 revealing a total cholesterol 178, LDL of 83 and HDL of 36   Sleep apnea History of obstructive sleep apnea on C Pap   Dyspnea on exertion Patient complains of fatigue and dyspnea on exertion. I last saw him 8 years ago. He has gained 40 pounds. I performed cardiac catheterization on him 06/17/07 which is entirely normal. He denies chest pain. I doubt  whether his dyspnea is cardiac in nature although I am going to get a 2-D echo to define his LV function. If this is normal, I suggested weight reduction and I will see him back when necessary.       Lorretta Harp MD FACP,FACC,FAHA, [facility] 01/31/2015 12:59 PM

## 2015-01-31 NOTE — Assessment & Plan Note (Signed)
History of obstructive sleep apnea on C Pap

## 2015-01-31 NOTE — Assessment & Plan Note (Signed)
History of hyperlipidemia not on statin therapy with this measures a lipid profile performed 12/16/14 revealing a total cholesterol 178, LDL of 83 and HDL of 36

## 2015-02-01 ENCOUNTER — Ambulatory Visit (INDEPENDENT_AMBULATORY_CARE_PROVIDER_SITE_OTHER): Payer: Medicare Other | Admitting: Radiology

## 2015-02-01 DIAGNOSIS — E291 Testicular hypofunction: Secondary | ICD-10-CM | POA: Diagnosis not present

## 2015-02-01 MED ORDER — TESTOSTERONE CYPIONATE 100 MG/ML IM SOLN
100.0000 mg | Freq: Once | INTRAMUSCULAR | Status: AC
  Administered 2015-02-01: 100 mg via INTRAMUSCULAR

## 2015-02-01 NOTE — Progress Notes (Signed)
   Subjective:    Patient ID: [name], male    DOB: [birth year], 74 y.o.   MRN: [id number]  HPI Patient presented in the office today , for his weekly Testosterone injection. Was given 100 mg/ml (1cc) in the RUOQ.   Review of Systems     Objective:   Physical Exam        Assessment & Plan:

## 2015-02-08 ENCOUNTER — Ambulatory Visit (INDEPENDENT_AMBULATORY_CARE_PROVIDER_SITE_OTHER): Payer: Medicare Other

## 2015-02-08 DIAGNOSIS — E349 Endocrine disorder, unspecified: Secondary | ICD-10-CM

## 2015-02-08 DIAGNOSIS — E291 Testicular hypofunction: Secondary | ICD-10-CM

## 2015-02-08 MED ORDER — TESTOSTERONE CYPIONATE 200 MG/ML IM SOLN
200.0000 mg | Freq: Once | INTRAMUSCULAR | Status: AC
  Administered 2015-02-08: 200 mg via INTRAMUSCULAR

## 2015-02-09 ENCOUNTER — Ambulatory Visit (HOSPITAL_COMMUNITY)
Admission: RE | Admit: 2015-02-09 | Discharge: 2015-02-09 | Disposition: A | Discharge status: Home / Self Care | Payer: Medicare Other | Source: Ambulatory Visit | Attending: Cardiovascular Disease | Admitting: Cardiovascular Disease

## 2015-02-09 DIAGNOSIS — R0600 Dyspnea, unspecified: Secondary | ICD-10-CM

## 2015-02-09 DIAGNOSIS — R0609 Other forms of dyspnea: Secondary | ICD-10-CM | POA: Diagnosis not present

## 2015-02-09 NOTE — Progress Notes (Signed)
2D Echocardiogram Complete.  02/09/2015   [name] [place], RDCS

## 2015-02-12 ENCOUNTER — Encounter: Payer: Self-pay | Admitting: *Deleted

## 2015-02-15 ENCOUNTER — Ambulatory Visit (INDEPENDENT_AMBULATORY_CARE_PROVIDER_SITE_OTHER): Payer: Medicare Other

## 2015-02-15 DIAGNOSIS — E291 Testicular hypofunction: Secondary | ICD-10-CM | POA: Diagnosis not present

## 2015-02-15 DIAGNOSIS — E349 Endocrine disorder, unspecified: Secondary | ICD-10-CM

## 2015-02-15 MED ORDER — TESTOSTERONE CYPIONATE 200 MG/ML IM SOLN
200.0000 mg | Freq: Once | INTRAMUSCULAR | Status: AC
  Administered 2015-02-15: 200 mg via INTRAMUSCULAR

## 2015-02-21 ENCOUNTER — Ambulatory Visit (INDEPENDENT_AMBULATORY_CARE_PROVIDER_SITE_OTHER): Payer: Medicare Other

## 2015-02-21 DIAGNOSIS — E291 Testicular hypofunction: Secondary | ICD-10-CM | POA: Diagnosis not present

## 2015-02-21 DIAGNOSIS — E349 Endocrine disorder, unspecified: Secondary | ICD-10-CM

## 2015-02-22 ENCOUNTER — Telehealth: Payer: Self-pay

## 2015-02-22 NOTE — Telephone Encounter (Signed)
Refill Request    HYDROcodone-acetaminophen (NORCO/VICODIN) 5-325 MG per tablet   [phone]

## 2015-02-23 ENCOUNTER — Telehealth: Payer: Self-pay

## 2015-02-23 ENCOUNTER — Other Ambulatory Visit: Payer: Self-pay | Admitting: Emergency Medicine

## 2015-02-23 DIAGNOSIS — M546 Pain in thoracic spine: Secondary | ICD-10-CM

## 2015-02-23 MED ORDER — HYDROCODONE-ACETAMINOPHEN 5-325 MG PO TABS: ORAL_TABLET | ORAL | Status: DC

## 2015-02-23 NOTE — Telephone Encounter (Signed)
Left message to return call 

## 2015-02-23 NOTE — Telephone Encounter (Signed)
Pt is asking for Ebony Hail to call him back.  He will not elaborate on what [phone]

## 2015-02-27 ENCOUNTER — Telehealth: Payer: Self-pay

## 2015-02-27 ENCOUNTER — Other Ambulatory Visit: Payer: Self-pay | Admitting: Internal Medicine

## 2015-02-27 ENCOUNTER — Ambulatory Visit (INDEPENDENT_AMBULATORY_CARE_PROVIDER_SITE_OTHER): Payer: Medicare Other

## 2015-02-27 DIAGNOSIS — M546 Pain in thoracic spine: Secondary | ICD-10-CM

## 2015-02-27 DIAGNOSIS — E291 Testicular hypofunction: Secondary | ICD-10-CM

## 2015-02-27 MED ORDER — TESTOSTERONE CYPIONATE 200 MG/ML IM SOLN
200.0000 mg | Freq: Once | INTRAMUSCULAR | Status: AC
  Administered 2015-02-27: 200 mg via INTRAMUSCULAR

## 2015-02-27 MED ORDER — HYDROCODONE-ACETAMINOPHEN 5-325 MG PO TABS: ORAL_TABLET | ORAL | Status: DC

## 2015-02-27 NOTE — Telephone Encounter (Signed)
Dr. Everlene Farrier,  [facility] pharmacy called regarding pt's Hydrocodone rx. They received two different sigs and wanted to know which one is correct. The only sig I saw was take 2-3 tablets per day for back/leg pain.

## 2015-02-27 NOTE — Telephone Encounter (Signed)
Spoke to Windell Hummingbird regarding pt's rx. Attempted to call pharmacy back to have them fax both rx's, but they closed at 7.

## 2015-02-28 NOTE — Progress Notes (Signed)
Thank you [name] for helping with [name]  prescription

## 2015-03-05 DIAGNOSIS — M25562 Pain in left knee: Secondary | ICD-10-CM | POA: Diagnosis not present

## 2015-03-05 DIAGNOSIS — M1712 Unilateral primary osteoarthritis, left knee: Secondary | ICD-10-CM | POA: Diagnosis not present

## 2015-03-08 ENCOUNTER — Telehealth: Payer: Self-pay | Admitting: *Deleted

## 2015-03-08 NOTE — Telephone Encounter (Signed)
Dr Marlou Sa requesting surgical clearance for left total knee arthroplasty on MR [place]. The procedure has not been scheduled yet.   I will defer to Dr Gwenlyn Found.

## 2015-03-08 NOTE — Telephone Encounter (Signed)
Recent normal 2-D echo, normal course by cath in 08, cleared for total knee replacement at low cardiovascular risk

## 2015-03-09 ENCOUNTER — Other Ambulatory Visit (HOSPITAL_COMMUNITY): Payer: Self-pay | Admitting: Orthopedic Surgery

## 2015-03-09 NOTE — Telephone Encounter (Signed)
Dr Gwenlyn Found is available for inpatient consultation.

## 2015-03-09 NOTE — Telephone Encounter (Signed)
I will fax this information to [employer].

## 2015-03-13 ENCOUNTER — Ambulatory Visit (INDEPENDENT_AMBULATORY_CARE_PROVIDER_SITE_OTHER): Payer: Medicare Other

## 2015-03-13 DIAGNOSIS — E291 Testicular hypofunction: Secondary | ICD-10-CM | POA: Diagnosis not present

## 2015-03-13 DIAGNOSIS — E349 Endocrine disorder, unspecified: Secondary | ICD-10-CM

## 2015-03-13 MED ORDER — TESTOSTERONE CYPIONATE 100 MG/ML IM SOLN
100.0000 mg | Freq: Once | INTRAMUSCULAR | Status: AC
  Administered 2015-03-13: 100 mg via INTRAMUSCULAR

## 2015-03-16 ENCOUNTER — Encounter (HOSPITAL_COMMUNITY): Payer: Self-pay

## 2015-03-16 ENCOUNTER — Inpatient Hospital Stay (HOSPITAL_COMMUNITY)
Admission: RE | Admit: 2015-03-16 | Discharge: 2015-03-16 | Disposition: A | Discharge status: Home / Self Care | Payer: Self-pay | Source: Ambulatory Visit

## 2015-03-16 HISTORY — DX: Gastro-esophageal reflux disease without esophagitis: K21.9

## 2015-03-16 NOTE — Progress Notes (Signed)
Cardiologist is Dr.Jonathan Gwenlyn Found with last visit in epic from 3/23-16  Echo report in epic from 2016  Heart cath report in epic from 2008  Sleep study report in epic from 2006  EKG in epic from 01-31-15  CXR in epic from 12-17-14  Medical Md is

## 2015-03-16 NOTE — Pre-Procedure Instructions (Signed)
[name]  03/16/2015   Your procedure is scheduled on:  Thurs, May 12 @ 11:30 AM  Report to [facility] En[facility]ance [facility]  at 9:30 AM.  Call this number if you have problems the morning of surgery: [phone]   Remember:   Do not eat food or drink liquids after midnight.   Take these medicines the morning of surgery with A SIP OF WATER: Wellbutrin(Bupropion),Zyrtec(Cetirizine),Cardura(Doxazosin),Nexium(Esomeprazole),Doxycycline(Vibra),Flonase(Fluticasone),Gabapentin(Neurontin),Pain Pill(if needed),Ativan(Lorazepam),and Omeprazole(Prilosec)               Stop taking your Mobic and Aspirin along with any Vitamins or Herbal Medications. No Goody's,BC's,Aleve,Ibuprofen,or Fish Oil.   Do not wear jewelry.  Do not wear lotions, powders, or colognes. You may wear deodorant.  Men may shave face and neck.  Do not bring valuables to the hospital.  [facility] is not responsible                  for any belongings or valuables.               Contacts, dentures or bridgework may not be worn into surgery.  Leave suitcase in the car. After surgery it may be brought to your room.  For patients admitted to the hospital, discharge time is determined by your                treatment team.               Patients discharged the day of surgery will not be allowed to drive  home.    Special Instructions:  [place] - Preparing for Surgery  Before surgery, you can play an important role.  Because skin is not sterile, your skin needs to be as free of germs as possible.  You can reduce the number of germs on you skin by washing with CHG (chlorahexidine gluconate) soap before surgery.  CHG is an antiseptic cleaner which kills germs and bonds with the skin to continue killing germs even after washing.  Please DO NOT use if you have an allergy to CHG or antibacterial soaps.  If your skin becomes reddened/irritated stop using the CHG and inform your nurse when you arrive at Short Stay.  Do not shave (including  legs and underarms) for at least 48 hours prior to the first CHG shower.  You may shave your face.  Please follow these instructions carefully:   1.  Shower with CHG Soap the night before surgery and the                                morning of Surgery.  2.  If you choose to wash your hair, wash your hair first as usual with your       normal shampoo.  3.  After you shampoo, rinse your hair and body thoroughly to remove the                      Shampoo.  4.  Use CHG as you would any other liquid soap.  You can apply chg directly       to the skin and wash gently with scrungie or a clean washcloth.  5.  Apply the CHG Soap to your body ONLY FROM THE NECK DOWN.        Do not use on open wounds or open sores.  Avoid contact with your eyes,  ears, mouth and genitals (private parts).  Wash genitals (private parts)       with your normal soap.  6.  Wash thoroughly, paying special attention to the area where your surgery        will be performed.  7.  Thoroughly rinse your body with warm water from the neck down.  8.  DO NOT shower/wash with your normal soap after using and rinsing off       the CHG Soap.  9.  Pat yourself dry with a clean towel.            10.  Wear clean pajamas.            11.  Place clean sheets on your bed the night of your first shower and do not        sleep with pets.  Day of Surgery  Do not apply any lotions/deoderants the morning of surgery.  Please wear clean clothes to the hospital/surgery center.     Please read over the following fact sheets that you were given: Pain Booklet, Coughing and Deep Breathing, MRSA Information and Surgical Site Infection Prevention

## 2015-03-21 ENCOUNTER — Ambulatory Visit (HOSPITAL_COMMUNITY): Admission: RE | Admit: 2015-03-21 | Payer: Medicare Other | Source: Ambulatory Visit

## 2015-03-21 ENCOUNTER — Encounter (HOSPITAL_COMMUNITY)
Admission: RE | Admit: 2015-03-21 | Discharge: 2015-03-21 | Disposition: A | Discharge status: Home / Self Care | Payer: Medicare Other | Source: Ambulatory Visit | Attending: Orthopedic Surgery | Admitting: Orthopedic Surgery

## 2015-03-21 ENCOUNTER — Encounter (HOSPITAL_COMMUNITY): Payer: Self-pay

## 2015-03-21 DIAGNOSIS — Z01818 Encounter for other preprocedural examination: Secondary | ICD-10-CM

## 2015-03-21 HISTORY — DX: Umbilical hernia without obstruction or gangrene: K42.9

## 2015-03-21 HISTORY — DX: Personal history of urinary calculi: Z87.442

## 2015-03-21 LAB — URINALYSIS, ROUTINE W REFLEX MICROSCOPIC
BILIRUBIN URINE: NEGATIVE
GLUCOSE, UA: NEGATIVE mg/dL
Hgb urine dipstick: NEGATIVE
Ketones, ur: NEGATIVE mg/dL
Leukocytes, UA: NEGATIVE
Nitrite: NEGATIVE
Protein, ur: NEGATIVE mg/dL
Specific Gravity, Urine: 1.012 (ref 1.005–1.030)
Urobilinogen, UA: 0.2 mg/dL (ref 0.0–1.0)
pH: 5 (ref 5.0–8.0)

## 2015-03-21 LAB — TYPE AND SCREEN
ABO/RH(D): O POS
Antibody Screen: NEGATIVE

## 2015-03-21 LAB — BASIC METABOLIC PANEL
Anion gap: 8 (ref 5–15)
BUN: 20 mg/dL (ref 6–20)
CALCIUM: 9 mg/dL (ref 8.9–10.3)
CHLORIDE: 104 mmol/L (ref 101–111)
CO2: 25 mmol/L (ref 22–32)
CREATININE: 1.76 mg/dL — AB (ref 0.61–1.24)
GFR calc non Af Amer: 37 mL/min — ABNORMAL LOW (ref >60)
GFR, EST AFRICAN AMERICAN: 42 mL/min — AB (ref >60)
Glucose, Bld: 121 mg/dL — ABNORMAL HIGH (ref 70–99)
Potassium: 3.9 mmol/L (ref 3.5–5.1)
Sodium: 137 mmol/L (ref 135–145)

## 2015-03-21 LAB — CBC
HEMATOCRIT: 45.2 % (ref 39.0–52.0)
Hemoglobin: 15.5 g/dL (ref 13.0–17.0)
MCH: 32.1 pg (ref 26.0–34.0)
MCHC: 34.3 g/dL (ref 30.0–36.0)
MCV: 93.6 fL (ref 78.0–100.0)
PLATELETS: 158 10*3/uL (ref 150–400)
RBC: 4.83 MIL/uL (ref 4.22–5.81)
RDW: 11.9 % (ref 11.5–15.5)
WBC: 5.7 10*3/uL (ref 4.0–10.5)

## 2015-03-21 LAB — SURGICAL PCR SCREEN
MRSA, PCR: NEGATIVE
Staphylococcus aureus: NEGATIVE

## 2015-03-21 LAB — PROTIME-INR
INR: 1.14 (ref 0.00–1.49)
Prothrombin Time: 14.7 seconds (ref 11.6–15.2)

## 2015-03-21 MED ORDER — CEFAZOLIN SODIUM-DEXTROSE 2-3 GM-% IV SOLR
2.0000 g | INTRAVENOUS | Status: AC
  Administered 2015-03-22: 2 g via INTRAVENOUS
  Filled 2015-03-21: qty 50

## 2015-03-21 MED ORDER — CHLORHEXIDINE GLUCONATE 4 % EX LIQD
60.0000 mL | CUTANEOUS | Status: DC
  Filled 2015-03-21: qty 60

## 2015-03-21 NOTE — Progress Notes (Signed)
Anesthesia Chart Review:  Pt is 74 year old male scheduled for L total knee arthroplasty on 03/22/2015 with Dr. Marlou Sa.   Cardiologist is Dr. Gwenlyn Found, last office visit 01/31/2015.   PMH includes: HTN, OSA, hyperlipidemia  Medications include: ASA, cardura, lasix, revatio  Chest x-ray 12/16/2014 reviewed. Hypoinflation without acute cardiopulmonary disease.   EKG 01/31/2015: NSR.   Echo 02/09/2015: - Left ventricle: The cavity size was normal. Wall thickness was normal. Systolic function was normal. The estimated ejection fraction was in the range of 55% to 60%. Wall motion was normal; there were no regional wall motion abnormalities. Doppler parameters are consistent with abnormal left ventricular relaxation (grade 1 diastolic dysfunction). - Aortic valve: There was trivial regurgitation. - Aortic root: The aortic root was mildly dilated. - Left atrium: The atrium was mildly dilated. Impressions:- Normal LV function; grade 1 diastolic dysfunction; mild LAE;trace AI/MR.  Cardiac cath 06/17/2007: 1. Left main normal. 2. LAD normal. 3. Left circumflex normal. 4. Right coronary artery is dominant and normal.  Dr. Gwenlyn Found has given cardiac clearance for procedure in Epic telephone encounter dated 03/08/2015.   If no changes, I anticipate pt can proceed with surgery as scheduled.   Willeen Cass, FNP-BC [facility]/Anesthesiology Phone: [phone] 03/21/2015 2:32 PM

## 2015-03-21 NOTE — Pre-Procedure Instructions (Addendum)
[name]  03/21/2015   Your procedure is scheduled on:  Thurs, May 12   Report to [facility] Entrance A  at 9:30 AM.  Call this number if you have problems the morning of surgery: [phone]   Remember:   Do not eat food or drink liquids after midnight.   Take these medicines the morning of surgery with A SIP OF WATER: Wellbutrin(Bupropion),,Cardura(Doxazosin),Nexium(Esomeprazole)or prilosec,,,Pain Pill(if needed),Ativan(Lorazepam)               Stop taking your Mobic(meloxicam) and Aspirin along with any Vitamins or Herbal Medications. No Goody's,BC's,Aleve,Ibuprofen,or Fish Oil.,OR ,glucosamine   Do not wear jewelry.  Do not wear lotions, powders, or colognes. You may wear deodorant.  Men may shave face and neck.  Do not bring valuables to the hospital.  [facility] is not responsible                  for any belongings or valuables.               Contacts, dentures or bridgework may not be worn into surgery.  Leave suitcase in the car. After surgery it may be brought to your room.  For patients admitted to the hospital, discharge time is determined by your                treatment team.               Patients discharged the day of surgery will not be allowed to drive  home.    Special Instructions:   - Preparing for Surgery  Before surgery, you can play an important role.  Because skin is not sterile, your skin needs to be as free of germs as possible.  You can reduce the number of germs on you skin by washing with CHG (chlorahexidine gluconate) soap before surgery.  CHG is an antiseptic cleaner which kills germs and bonds with the skin to continue killing germs even after washing.  Please DO NOT use if you have an allergy to CHG or antibacterial soaps.  If your skin becomes reddened/irritated stop using the CHG and inform your nurse when you arrive at Short Stay.  Do not shave (including legs and underarms) for at least 48 hours prior to the first CHG shower.   You may shave your face.  Please follow these instructions carefully:   1.  Shower with CHG Soap the night before surgery and the                                morning of Surgery.  2.  If you choose to wash your hair, wash your hair first as usual with your       normal shampoo.  3.  After you shampoo, rinse your hair and body thoroughly to remove the                      Shampoo.  4.  Use CHG as you would any other liquid soap.  You can apply chg directly       to the skin and wash gently with scrungie or a clean washcloth.  5.  Apply the CHG Soap to your body ONLY FROM THE NECK DOWN.        Do not use on open wounds or open sores.  Avoid contact with your eyes,       ears,  mouth and genitals (private parts).  Wash genitals (private parts)       with your normal soap.  6.  Wash thoroughly, paying special attention to the area where your surgery        will be performed.  7.  Thoroughly rinse your body with warm water from the neck down.  8.  DO NOT shower/wash with your normal soap after using and rinsing off       the CHG Soap.  9.  Pat yourself dry with a clean towel.            10.  Wear clean pajamas.            11.  Place clean sheets on your bed the night of your first shower and do not        sleep with pets.  Day of Surgery  Do not apply any lotions/deoderants the morning of surgery.  Please wear clean clothes to the hospital/surgery center.     Please read over the following fact sheets that you were given: Pain Booklet, Coughing and Deep Breathing, MRSA Information and Surgical Site Infection Prevention

## 2015-03-22 ENCOUNTER — Encounter (HOSPITAL_COMMUNITY): Payer: Self-pay | Admitting: *Deleted

## 2015-03-22 ENCOUNTER — Inpatient Hospital Stay (HOSPITAL_COMMUNITY): Payer: Medicare Other | Admitting: Anesthesiology

## 2015-03-22 ENCOUNTER — Encounter (HOSPITAL_COMMUNITY)
Admission: RE | Disposition: A | Discharge status: Skilled Nursing Facility | Payer: Self-pay | Source: Ambulatory Visit | Attending: Orthopedic Surgery

## 2015-03-22 ENCOUNTER — Inpatient Hospital Stay (HOSPITAL_COMMUNITY)
Admission: RE | Admit: 2015-03-22 | Discharge: 2015-03-25 | DRG: 470 | Disposition: A | Discharge status: Skilled Nursing Facility | Payer: Medicare Other | Source: Ambulatory Visit | Attending: Orthopedic Surgery | Admitting: Orthopedic Surgery

## 2015-03-22 ENCOUNTER — Inpatient Hospital Stay (HOSPITAL_COMMUNITY): Payer: Medicare Other | Admitting: Emergency Medicine

## 2015-03-22 DIAGNOSIS — F419 Anxiety disorder, unspecified: Secondary | ICD-10-CM | POA: Diagnosis present

## 2015-03-22 DIAGNOSIS — M5416 Radiculopathy, lumbar region: Secondary | ICD-10-CM | POA: Diagnosis not present

## 2015-03-22 DIAGNOSIS — E785 Hyperlipidemia, unspecified: Secondary | ICD-10-CM | POA: Diagnosis present

## 2015-03-22 DIAGNOSIS — K219 Gastro-esophageal reflux disease without esophagitis: Secondary | ICD-10-CM | POA: Diagnosis present

## 2015-03-22 DIAGNOSIS — R278 Other lack of coordination: Secondary | ICD-10-CM | POA: Diagnosis not present

## 2015-03-22 DIAGNOSIS — J302 Other seasonal allergic rhinitis: Secondary | ICD-10-CM | POA: Diagnosis present

## 2015-03-22 DIAGNOSIS — M1712 Unilateral primary osteoarthritis, left knee: Principal | ICD-10-CM | POA: Diagnosis present

## 2015-03-22 DIAGNOSIS — F329 Major depressive disorder, single episode, unspecified: Secondary | ICD-10-CM | POA: Diagnosis present

## 2015-03-22 DIAGNOSIS — Z96652 Presence of left artificial knee joint: Secondary | ICD-10-CM | POA: Diagnosis not present

## 2015-03-22 DIAGNOSIS — I872 Venous insufficiency (chronic) (peripheral): Secondary | ICD-10-CM | POA: Diagnosis not present

## 2015-03-22 DIAGNOSIS — Z89022 Acquired absence of left finger(s): Secondary | ICD-10-CM | POA: Diagnosis not present

## 2015-03-22 DIAGNOSIS — K210 Gastro-esophageal reflux disease with esophagitis: Secondary | ICD-10-CM | POA: Diagnosis not present

## 2015-03-22 DIAGNOSIS — G8918 Other acute postprocedural pain: Secondary | ICD-10-CM | POA: Diagnosis not present

## 2015-03-22 DIAGNOSIS — Z79891 Long term (current) use of opiate analgesic: Secondary | ICD-10-CM | POA: Diagnosis not present

## 2015-03-22 DIAGNOSIS — G4730 Sleep apnea, unspecified: Secondary | ICD-10-CM | POA: Diagnosis not present

## 2015-03-22 DIAGNOSIS — M5136 Other intervertebral disc degeneration, lumbar region: Secondary | ICD-10-CM | POA: Diagnosis not present

## 2015-03-22 DIAGNOSIS — Z7982 Long term (current) use of aspirin: Secondary | ICD-10-CM | POA: Diagnosis not present

## 2015-03-22 DIAGNOSIS — Z79899 Other long term (current) drug therapy: Secondary | ICD-10-CM

## 2015-03-22 DIAGNOSIS — G4733 Obstructive sleep apnea (adult) (pediatric): Secondary | ICD-10-CM | POA: Diagnosis present

## 2015-03-22 DIAGNOSIS — M179 Osteoarthritis of knee, unspecified: Secondary | ICD-10-CM | POA: Diagnosis not present

## 2015-03-22 DIAGNOSIS — R2681 Unsteadiness on feet: Secondary | ICD-10-CM | POA: Diagnosis not present

## 2015-03-22 DIAGNOSIS — E291 Testicular hypofunction: Secondary | ICD-10-CM | POA: Diagnosis not present

## 2015-03-22 DIAGNOSIS — J189 Pneumonia, unspecified organism: Secondary | ICD-10-CM

## 2015-03-22 DIAGNOSIS — G4700 Insomnia, unspecified: Secondary | ICD-10-CM | POA: Diagnosis not present

## 2015-03-22 DIAGNOSIS — R0602 Shortness of breath: Secondary | ICD-10-CM | POA: Diagnosis not present

## 2015-03-22 DIAGNOSIS — M6281 Muscle weakness (generalized): Secondary | ICD-10-CM | POA: Diagnosis not present

## 2015-03-22 DIAGNOSIS — Z471 Aftercare following joint replacement surgery: Secondary | ICD-10-CM | POA: Diagnosis not present

## 2015-03-22 DIAGNOSIS — M25562 Pain in left knee: Secondary | ICD-10-CM | POA: Diagnosis not present

## 2015-03-22 DIAGNOSIS — M1710 Unilateral primary osteoarthritis, unspecified knee: Secondary | ICD-10-CM | POA: Diagnosis present

## 2015-03-22 HISTORY — PX: TOTAL KNEE ARTHROPLASTY: SHX125

## 2015-03-22 SURGERY — ARTHROPLASTY, KNEE, TOTAL
Anesthesia: Monitor Anesthesia Care | Site: Knee | Laterality: Left

## 2015-03-22 MED ORDER — MIDAZOLAM HCL 2 MG/2ML IJ SOLN
INTRAMUSCULAR | Status: AC
  Filled 2015-03-22: qty 2

## 2015-03-22 MED ORDER — LACTATED RINGERS IV SOLN
INTRAVENOUS | Status: DC
  Administered 2015-03-22 (×3): via INTRAVENOUS

## 2015-03-22 MED ORDER — HYDROMORPHONE HCL 1 MG/ML IJ SOLN: 1.0000 mg | INTRAMUSCULAR | Status: DC | PRN

## 2015-03-22 MED ORDER — ACETAMINOPHEN 325 MG PO TABS
650.0000 mg | ORAL_TABLET | Freq: Four times a day (QID) | ORAL | Status: DC | PRN
Start: 2015-03-22 — End: 2015-03-25

## 2015-03-22 MED ORDER — ARTIFICIAL TEARS OP OINT
TOPICAL_OINTMENT | OPHTHALMIC | Status: AC
  Filled 2015-03-22: qty 3.5

## 2015-03-22 MED ORDER — NEOSTIGMINE METHYLSULFATE 10 MG/10ML IV SOLN
INTRAVENOUS | Status: AC
  Filled 2015-03-22: qty 1

## 2015-03-22 MED ORDER — BUPIVACAINE LIPOSOME 1.3 % IJ SUSP
20.0000 mL | INTRAMUSCULAR | Status: DC
  Filled 2015-03-22: qty 20

## 2015-03-22 MED ORDER — GABAPENTIN 300 MG PO CAPS
300.0000 mg | ORAL_CAPSULE | Freq: Three times a day (TID) | ORAL | Status: DC
  Administered 2015-03-22 – 2015-03-25 (×8): 300 mg via ORAL
  Filled 2015-03-22 (×8): qty 1

## 2015-03-22 MED ORDER — EPHEDRINE SULFATE 50 MG/ML IJ SOLN
INTRAMUSCULAR | Status: DC | PRN
  Administered 2015-03-22: 10 mg via INTRAVENOUS

## 2015-03-22 MED ORDER — LIDOCAINE HCL (CARDIAC) 20 MG/ML IV SOLN
INTRAVENOUS | Status: DC | PRN
  Administered 2015-03-22: 100 mg via INTRAVENOUS

## 2015-03-22 MED ORDER — FENTANYL CITRATE (PF) 100 MCG/2ML IJ SOLN
INTRAMUSCULAR | Status: DC | PRN
  Administered 2015-03-22 (×3): 50 ug via INTRAVENOUS
  Administered 2015-03-22: 100 ug via INTRAVENOUS

## 2015-03-22 MED ORDER — MIDAZOLAM HCL 2 MG/2ML IJ SOLN
2.0000 mg | Freq: Once | INTRAMUSCULAR | Status: DC
Start: 2015-03-22 — End: 2015-03-22

## 2015-03-22 MED ORDER — ACETAMINOPHEN 10 MG/ML IV SOLN
INTRAVENOUS | Status: AC
  Filled 2015-03-22: qty 100

## 2015-03-22 MED ORDER — MEPERIDINE HCL 25 MG/ML IJ SOLN: 6.2500 mg | INTRAMUSCULAR | Status: DC | PRN

## 2015-03-22 MED ORDER — BUPIVACAINE LIPOSOME 1.3 % IJ SUSP
INTRAMUSCULAR | Status: DC | PRN
  Administered 2015-03-22: 20 mL

## 2015-03-22 MED ORDER — FUROSEMIDE 40 MG PO TABS
40.0000 mg | ORAL_TABLET | Freq: Every day | ORAL | Status: DC
  Administered 2015-03-23 – 2015-03-25 (×3): 40 mg via ORAL
  Filled 2015-03-22 (×4): qty 1

## 2015-03-22 MED ORDER — LIDOCAINE HCL (CARDIAC) 20 MG/ML IV SOLN
INTRAVENOUS | Status: AC
  Filled 2015-03-22: qty 5

## 2015-03-22 MED ORDER — SUCCINYLCHOLINE CHLORIDE 20 MG/ML IJ SOLN
INTRAMUSCULAR | Status: DC | PRN
  Administered 2015-03-22: 120 mg via INTRAVENOUS

## 2015-03-22 MED ORDER — PROMETHAZINE HCL 25 MG/ML IJ SOLN
6.2500 mg | INTRAMUSCULAR | Status: DC | PRN
  Administered 2015-03-22: 12.5 mg via INTRAVENOUS

## 2015-03-22 MED ORDER — ADULT MULTIVITAMIN W/MINERALS CH
1.0000 | ORAL_TABLET | Freq: Every day | ORAL | Status: DC
  Administered 2015-03-23 – 2015-03-25 (×3): 1 via ORAL
  Filled 2015-03-22 (×3): qty 1

## 2015-03-22 MED ORDER — LORAZEPAM 1 MG PO TABS
1.0000 mg | ORAL_TABLET | Freq: Two times a day (BID) | ORAL | Status: DC
  Administered 2015-03-23 – 2015-03-25 (×5): 1 mg via ORAL
  Filled 2015-03-22 (×6): qty 1

## 2015-03-22 MED ORDER — ESOMEPRAZOLE MAGNESIUM 20 MG PO PACK: 20.0000 mg | PACK | Freq: Every day | ORAL | Status: DC

## 2015-03-22 MED ORDER — GLYCOPYRROLATE 0.2 MG/ML IJ SOLN
INTRAMUSCULAR | Status: DC | PRN
  Administered 2015-03-22: .6 mg via INTRAVENOUS

## 2015-03-22 MED ORDER — EPHEDRINE SULFATE 50 MG/ML IJ SOLN
INTRAMUSCULAR | Status: AC
  Filled 2015-03-22: qty 1

## 2015-03-22 MED ORDER — TRANEXAMIC ACID 1000 MG/10ML IV SOLN
2000.0000 mg | INTRAVENOUS | Status: DC | PRN
  Administered 2015-03-22: 2000 mg via INTRAVENOUS

## 2015-03-22 MED ORDER — POTASSIUM CHLORIDE IN NACL 20-0.9 MEQ/L-% IV SOLN
INTRAVENOUS | Status: AC
  Administered 2015-03-22: 21:00:00 via INTRAVENOUS
  Filled 2015-03-22 (×3): qty 1000

## 2015-03-22 MED ORDER — PHENOL 1.4 % MT LIQD: 1.0000 | OROMUCOSAL | Status: DC | PRN

## 2015-03-22 MED ORDER — BUPIVACAINE-EPINEPHRINE (PF) 0.5% -1:200000 IJ SOLN
INTRAMUSCULAR | Status: AC
  Filled 2015-03-22: qty 30

## 2015-03-22 MED ORDER — CLONIDINE HCL (ANALGESIA) 100 MCG/ML EP SOLN
EPIDURAL | Status: DC | PRN
  Administered 2015-03-22: 1 mL via INTRA_ARTICULAR

## 2015-03-22 MED ORDER — GLYCOPYRROLATE 0.2 MG/ML IJ SOLN
INTRAMUSCULAR | Status: AC
  Filled 2015-03-22: qty 3

## 2015-03-22 MED ORDER — ONDANSETRON HCL 4 MG/2ML IJ SOLN
INTRAMUSCULAR | Status: AC
  Filled 2015-03-22: qty 2

## 2015-03-22 MED ORDER — MORPHINE SULFATE 4 MG/ML IJ SOLN
INTRAMUSCULAR | Status: DC | PRN
  Administered 2015-03-22: 8 mg

## 2015-03-22 MED ORDER — DOXAZOSIN MESYLATE 8 MG PO TABS
8.0000 mg | ORAL_TABLET | Freq: Every day | ORAL | Status: DC
  Administered 2015-03-22 – 2015-03-25 (×4): 8 mg via ORAL
  Filled 2015-03-22 (×5): qty 1

## 2015-03-22 MED ORDER — METOCLOPRAMIDE HCL 5 MG PO TABS: 5.0000 mg | ORAL_TABLET | Freq: Three times a day (TID) | ORAL | Status: DC | PRN

## 2015-03-22 MED ORDER — FENTANYL CITRATE (PF) 250 MCG/5ML IJ SOLN
INTRAMUSCULAR | Status: AC
  Filled 2015-03-22: qty 5

## 2015-03-22 MED ORDER — ONDANSETRON HCL 4 MG/2ML IJ SOLN: 4.0000 mg | Freq: Four times a day (QID) | INTRAMUSCULAR | Status: DC | PRN

## 2015-03-22 MED ORDER — ACETAMINOPHEN 10 MG/ML IV SOLN
1000.0000 mg | Freq: Once | INTRAVENOUS | Status: AC
  Administered 2015-03-22: 1000 mg via INTRAVENOUS

## 2015-03-22 MED ORDER — DOCUSATE SODIUM 100 MG PO CAPS
100.0000 mg | ORAL_CAPSULE | Freq: Two times a day (BID) | ORAL | Status: DC
  Administered 2015-03-22 – 2015-03-25 (×6): 100 mg via ORAL
  Filled 2015-03-22 (×6): qty 1

## 2015-03-22 MED ORDER — HYDROMORPHONE HCL 1 MG/ML IJ SOLN
INTRAMUSCULAR | Status: AC
  Filled 2015-03-22: qty 1

## 2015-03-22 MED ORDER — METOCLOPRAMIDE HCL 5 MG/ML IJ SOLN: 5.0000 mg | Freq: Three times a day (TID) | INTRAMUSCULAR | Status: DC | PRN

## 2015-03-22 MED ORDER — NEOSTIGMINE METHYLSULFATE 10 MG/10ML IV SOLN
INTRAVENOUS | Status: DC | PRN
  Administered 2015-03-22: 4 mg via INTRAVENOUS

## 2015-03-22 MED ORDER — PROPOFOL 10 MG/ML IV BOLUS
INTRAVENOUS | Status: DC | PRN
  Administered 2015-03-22: 200 mg via INTRAVENOUS

## 2015-03-22 MED ORDER — CEFAZOLIN SODIUM-DEXTROSE 2-3 GM-% IV SOLR
2.0000 g | Freq: Three times a day (TID) | INTRAVENOUS | Status: AC
  Administered 2015-03-22 – 2015-03-23 (×2): 2 g via INTRAVENOUS
  Filled 2015-03-22 (×2): qty 50

## 2015-03-22 MED ORDER — ARTIFICIAL TEARS OP OINT
TOPICAL_OINTMENT | OPHTHALMIC | Status: DC | PRN
  Administered 2015-03-22: 1 via OPHTHALMIC

## 2015-03-22 MED ORDER — 0.9 % SODIUM CHLORIDE (POUR BTL) OPTIME
TOPICAL | Status: DC | PRN
  Administered 2015-03-22: 1000 mL

## 2015-03-22 MED ORDER — LORATADINE 10 MG PO TABS
10.0000 mg | ORAL_TABLET | Freq: Every day | ORAL | Status: DC
  Administered 2015-03-23 – 2015-03-25 (×3): 10 mg via ORAL
  Filled 2015-03-22 (×3): qty 1

## 2015-03-22 MED ORDER — MIDAZOLAM HCL 5 MG/5ML IJ SOLN
INTRAMUSCULAR | Status: DC | PRN
  Administered 2015-03-22: 2 mg via INTRAVENOUS

## 2015-03-22 MED ORDER — CLONIDINE HCL (ANALGESIA) 100 MCG/ML EP SOLN
150.0000 ug | EPIDURAL | Status: DC
  Filled 2015-03-22: qty 1.5

## 2015-03-22 MED ORDER — SODIUM CHLORIDE 0.9 % IJ SOLN
INTRAMUSCULAR | Status: DC | PRN
  Administered 2015-03-22: 40 mL

## 2015-03-22 MED ORDER — OXYCODONE HCL 5 MG PO TABS
5.0000 mg | ORAL_TABLET | ORAL | Status: DC | PRN
  Administered 2015-03-23 – 2015-03-25 (×7): 10 mg via ORAL
  Filled 2015-03-22 (×8): qty 2

## 2015-03-22 MED ORDER — TRANEXAMIC ACID 1000 MG/10ML IV SOLN
2000.0000 mg | INTRAVENOUS | Status: DC
  Filled 2015-03-22: qty 20

## 2015-03-22 MED ORDER — FENTANYL CITRATE (PF) 100 MCG/2ML IJ SOLN
100.0000 ug | Freq: Once | INTRAMUSCULAR | Status: AC
  Administered 2015-03-22: 100 ug via INTRAVENOUS

## 2015-03-22 MED ORDER — ROCURONIUM BROMIDE 100 MG/10ML IV SOLN
INTRAVENOUS | Status: DC | PRN
  Administered 2015-03-22: 50 mg via INTRAVENOUS

## 2015-03-22 MED ORDER — BUPROPION HCL ER (XL) 300 MG PO TB24
300.0000 mg | ORAL_TABLET | Freq: Every morning | ORAL | Status: DC
  Administered 2015-03-23 – 2015-03-25 (×3): 300 mg via ORAL
  Filled 2015-03-22 (×4): qty 1

## 2015-03-22 MED ORDER — PANTOPRAZOLE SODIUM 40 MG PO TBEC
40.0000 mg | DELAYED_RELEASE_TABLET | Freq: Every day | ORAL | Status: DC
  Administered 2015-03-22 – 2015-03-25 (×4): 40 mg via ORAL
  Filled 2015-03-22 (×4): qty 1

## 2015-03-22 MED ORDER — MENTHOL 3 MG MT LOZG
1.0000 | LOZENGE | OROMUCOSAL | Status: DC | PRN
  Filled 2015-03-22 (×2): qty 9

## 2015-03-22 MED ORDER — CALCIUM CARBONATE ANTACID 500 MG PO CHEW
1.0000 | CHEWABLE_TABLET | Freq: Every day | ORAL | Status: DC
  Administered 2015-03-23 – 2015-03-25 (×3): 200 mg via ORAL
  Filled 2015-03-22 (×4): qty 1

## 2015-03-22 MED ORDER — MORPHINE SULFATE 4 MG/ML IJ SOLN
INTRAMUSCULAR | Status: AC
  Filled 2015-03-22: qty 2

## 2015-03-22 MED ORDER — PROPOFOL 10 MG/ML IV BOLUS
INTRAVENOUS | Status: AC
  Filled 2015-03-22: qty 40

## 2015-03-22 MED ORDER — BUPIVACAINE HCL (PF) 0.25 % IJ SOLN
INTRAMUSCULAR | Status: DC | PRN
  Administered 2015-03-22: 30 mL

## 2015-03-22 MED ORDER — POTASSIUM CHLORIDE CRYS ER 20 MEQ PO TBCR
40.0000 meq | EXTENDED_RELEASE_TABLET | Freq: Every day | ORAL | Status: DC
  Administered 2015-03-22 – 2015-03-25 (×4): 40 meq via ORAL
  Filled 2015-03-22 (×4): qty 2

## 2015-03-22 MED ORDER — FENTANYL CITRATE (PF) 100 MCG/2ML IJ SOLN
INTRAMUSCULAR | Status: AC
  Administered 2015-03-22: 100 ug via INTRAVENOUS
  Filled 2015-03-22: qty 2

## 2015-03-22 MED ORDER — ACETAMINOPHEN 650 MG RE SUPP: 650.0000 mg | Freq: Four times a day (QID) | RECTAL | Status: DC | PRN

## 2015-03-22 MED ORDER — RIVAROXABAN 10 MG PO TABS
10.0000 mg | ORAL_TABLET | Freq: Every day | ORAL | Status: DC
  Administered 2015-03-23 – 2015-03-25 (×3): 10 mg via ORAL
  Filled 2015-03-22 (×3): qty 1

## 2015-03-22 MED ORDER — ONDANSETRON HCL 4 MG PO TABS: 4.0000 mg | ORAL_TABLET | Freq: Four times a day (QID) | ORAL | Status: DC | PRN

## 2015-03-22 MED ORDER — ONDANSETRON HCL 4 MG/2ML IJ SOLN
INTRAMUSCULAR | Status: DC | PRN
  Administered 2015-03-22: 4 mg via INTRAVENOUS

## 2015-03-22 MED ORDER — ROCURONIUM BROMIDE 50 MG/5ML IV SOLN
INTRAVENOUS | Status: AC
  Filled 2015-03-22: qty 1

## 2015-03-22 MED ORDER — TEMAZEPAM 15 MG PO CAPS: 15.0000 mg | ORAL_CAPSULE | Freq: Every evening | ORAL | Status: DC | PRN

## 2015-03-22 MED ORDER — CALCIUM CARBONATE 200 MG PO CAPS: 200.0000 mg | ORAL_CAPSULE | Freq: Every day | ORAL | Status: DC

## 2015-03-22 MED ORDER — ONE-DAILY MULTI VITAMINS PO TABS: 1.0000 | ORAL_TABLET | Freq: Every day | ORAL | Status: DC

## 2015-03-22 MED ORDER — MENTHOL 3 MG MT LOZG
LOZENGE | OROMUCOSAL | Status: AC
  Filled 2015-03-22: qty 9

## 2015-03-22 MED ORDER — METHOCARBAMOL 500 MG PO TABS
500.0000 mg | ORAL_TABLET | Freq: Four times a day (QID) | ORAL | Status: DC | PRN
  Filled 2015-03-22: qty 1

## 2015-03-22 MED ORDER — SODIUM CHLORIDE 0.9 % IJ SOLN
INTRAMUSCULAR | Status: AC
  Filled 2015-03-22: qty 10

## 2015-03-22 MED ORDER — HYDROMORPHONE HCL 1 MG/ML IJ SOLN
0.2500 mg | INTRAMUSCULAR | Status: DC | PRN
  Administered 2015-03-22 (×2): 0.5 mg via INTRAVENOUS

## 2015-03-22 SURGICAL SUPPLY — 77 items
BANDAGE ELASTIC 4 VELCRO ST LF (GAUZE/BANDAGES/DRESSINGS) ×2 IMPLANT
BANDAGE ELASTIC 6 VELCRO ST LF (GAUZE/BANDAGES/DRESSINGS) ×1 IMPLANT
BANDAGE ESMARK 6X9 LF (GAUZE/BANDAGES/DRESSINGS) ×1 IMPLANT
BLADE SAG 18X100X1.27 (BLADE) ×3 IMPLANT
BLADE SAW SGTL 13.0X1.19X90.0M (BLADE) ×1 IMPLANT
BNDG CMPR 9X6 STRL LF SNTH (GAUZE/BANDAGES/DRESSINGS) ×1
BNDG CMPR MED 10X6 ELC LF (GAUZE/BANDAGES/DRESSINGS) ×3
BNDG COHESIVE 6X5 TAN STRL LF (GAUZE/BANDAGES/DRESSINGS) ×2 IMPLANT
BNDG ELASTIC 6X10 VLCR STRL LF (GAUZE/BANDAGES/DRESSINGS) ×6 IMPLANT
BNDG ESMARK 6X9 LF (GAUZE/BANDAGES/DRESSINGS) ×2
BOWL SMART MIX CTS (DISPOSABLE) ×1 IMPLANT
CAPT KNEE TOTAL 3 ×1 IMPLANT
CEMENT BONE SIMPLEX SPEEDSET (Cement) ×2 IMPLANT
COVER SURGICAL LIGHT HANDLE (MISCELLANEOUS) ×2 IMPLANT
CUFF TOURNIQUET SINGLE 34IN LL (TOURNIQUET CUFF) ×1 IMPLANT
CUFF TOURNIQUET SINGLE 44IN (TOURNIQUET CUFF) IMPLANT
DRAPE IMP U-DRAPE 54X76 (DRAPES) ×2 IMPLANT
DRAPE INCISE IOBAN 66X45 STRL (DRAPES) ×1 IMPLANT
DRAPE ORTHO SPLIT 77X108 STRL (DRAPES) ×6
DRAPE SURG ORHT 6 SPLT 77X108 (DRAPES) ×3 IMPLANT
DRAPE U-SHAPE 47X51 STRL (DRAPES) ×2 IMPLANT
DRSG AQUACEL AG ADV 3.5X10 (GAUZE/BANDAGES/DRESSINGS) ×2 IMPLANT
DRSG PAD ABDOMINAL 8X10 ST (GAUZE/BANDAGES/DRESSINGS) ×3 IMPLANT
DURAPREP 26ML APPLICATOR (WOUND CARE) ×2 IMPLANT
ELECT REM PT RETURN 9FT ADLT (ELECTROSURGICAL) ×2
ELECTRODE REM PT RTRN 9FT ADLT (ELECTROSURGICAL) ×1 IMPLANT
FACESHIELD WRAPAROUND (MASK) ×2 IMPLANT
FACESHIELD WRAPAROUND OR TEAM (MASK) ×1 IMPLANT
GAUZE SPONGE 4X4 12PLY STRL (GAUZE/BANDAGES/DRESSINGS) ×2 IMPLANT
GAUZE XEROFORM 1X8 LF (GAUZE/BANDAGES/DRESSINGS) ×2 IMPLANT
GAUZE XEROFORM 5X9 LF (GAUZE/BANDAGES/DRESSINGS) ×2 IMPLANT
GLOVE BIOGEL PI IND STRL 7.5 (GLOVE) ×1 IMPLANT
GLOVE BIOGEL PI IND STRL 8 (GLOVE) ×2 IMPLANT
GLOVE BIOGEL PI INDICATOR 7.5 (GLOVE) ×1
GLOVE BIOGEL PI INDICATOR 8 (GLOVE) ×2
GLOVE ECLIPSE 7.0 STRL STRAW (GLOVE) ×4 IMPLANT
GLOVE SURG ORTHO 8.0 STRL STRW (GLOVE) ×4 IMPLANT
GOWN STRL REUS W/ TWL LRG LVL3 (GOWN DISPOSABLE) ×3 IMPLANT
GOWN STRL REUS W/TWL 2XL LVL3 (GOWN DISPOSABLE) ×2 IMPLANT
GOWN STRL REUS W/TWL LRG LVL3 (GOWN DISPOSABLE) ×6
HANDPIECE INTERPULSE COAX TIP (DISPOSABLE) ×2
HOOD PEEL AWAY FACE SHEILD DIS (HOOD) ×6 IMPLANT
IMMOBILIZER KNEE 20 (SOFTGOODS) IMPLANT
IMMOBILIZER KNEE 22 UNIV (SOFTGOODS) ×1 IMPLANT
IMMOBILIZER KNEE 24 THIGH 36 (MISCELLANEOUS) IMPLANT
IMMOBILIZER KNEE 24 UNIV (MISCELLANEOUS)
KIT BASIN OR (CUSTOM PROCEDURE TRAY) ×2 IMPLANT
KIT ROOM TURNOVER OR (KITS) ×2 IMPLANT
MANIFOLD NEPTUNE II (INSTRUMENTS) ×2 IMPLANT
NDL 18GX1X1/2 (RX/OR ONLY) (NEEDLE) ×1 IMPLANT
NDL SPNL 18GX3.5 QUINCKE PK (NEEDLE) ×1 IMPLANT
NEEDLE 18GX1X1/2 (RX/OR ONLY) (NEEDLE) ×2 IMPLANT
NEEDLE HYPO 22GX1.5 SAFETY (NEEDLE) ×4 IMPLANT
NEEDLE SPNL 18GX3.5 QUINCKE PK (NEEDLE) ×2 IMPLANT
NS IRRIG 1000ML POUR BTL (IV SOLUTION) ×4 IMPLANT
PACK TOTAL JOINT (CUSTOM PROCEDURE TRAY) ×2 IMPLANT
PACK UNIVERSAL I (CUSTOM PROCEDURE TRAY) ×2 IMPLANT
PAD ARMBOARD 7.5X6 YLW CONV (MISCELLANEOUS) ×4 IMPLANT
PAD CAST 4YDX4 CTTN HI CHSV (CAST SUPPLIES) ×1 IMPLANT
PADDING CAST COTTON 4X4 STRL (CAST SUPPLIES) ×2
PADDING CAST COTTON 6X4 STRL (CAST SUPPLIES) ×4 IMPLANT
RUBBERBAND STERILE (MISCELLANEOUS) IMPLANT
SET HNDPC FAN SPRY TIP SCT (DISPOSABLE) IMPLANT
SPONGE LAP 18X18 X RAY DECT (DISPOSABLE) IMPLANT
STAPLER VISISTAT 35W (STAPLE) ×2 IMPLANT
SUCTION FRAZIER TIP 10 FR DISP (SUCTIONS) ×2 IMPLANT
SUT VIC AB 0 CTB1 27 (SUTURE) ×6 IMPLANT
SUT VIC AB 1 CT1 27 (SUTURE) ×10
SUT VIC AB 1 CT1 27XBRD ANBCTR (SUTURE) ×5 IMPLANT
SUT VIC AB 2-0 CT1 27 (SUTURE) ×4
SUT VIC AB 2-0 CT1 TAPERPNT 27 (SUTURE) ×2 IMPLANT
SYR 30ML LL (SYRINGE) ×6 IMPLANT
SYR TB 1ML LUER SLIP (SYRINGE) ×2 IMPLANT
TOWEL OR 17X24 6PK STRL BLUE (TOWEL DISPOSABLE) ×4 IMPLANT
TOWEL OR 17X26 10 PK STRL BLUE (TOWEL DISPOSABLE) ×4 IMPLANT
TRAY FOLEY CATH 16FRSI W/METER (SET/KITS/TRAYS/PACK) IMPLANT
WATER STERILE IRR 1000ML POUR (IV SOLUTION) ×4 IMPLANT

## 2015-03-22 NOTE — Progress Notes (Signed)
Beth, CRNA at bedside.

## 2015-03-22 NOTE — Anesthesia Preprocedure Evaluation (Addendum)
Anesthesia Evaluation  Patient identified by MRN, date of birth, ID band Patient awake    Reviewed: Allergy & Precautions, NPO status , Patient's Chart, lab work & pertinent test results  Airway Mallampati: II  TM Distance: >3 FB Neck ROM: Full    Dental no notable dental hx.    Pulmonary shortness of breath, sleep apnea , Recent URI ,  breath sounds clear to auscultation  Pulmonary exam normal       Cardiovascular hypertension, Pt. on medications + Peripheral Vascular Disease Normal cardiovascular examRhythm:Regular Rate:Normal  Echo 02/2015 - Left ventricle: The cavity size was normal. Wall thickness wasnormal. Systolic function was normal. The estimated ejectionfraction was in the range of 55% to 60%. Wall motion was normal;there were no regional wall motion abnormalities. Dopplerparameters are cnsistent with abnormal left ventricularrelaxation (grade 1 diastolic dysfunction). - Aortic valve: There was trivial regurgitation. - Aortic root: The aortic root was mildly dilated. - Left atrium: The atrium was mildly dilated.  Impressions: - Normal LV function; grade 1 diastolic dysfunction; mild LAE;trace AI/MR.    Neuro/Psych PSYCHIATRIC DISORDERS Anxiety Depression negative neurological ROS     GI/Hepatic Neg liver ROS, GERD-  ,  Endo/Other  negative endocrine ROS  Renal/GU CRF and Renal InsufficiencyRenal disease     Musculoskeletal  (+) Arthritis -,   Abdominal   Peds  Hematology negative hematology ROS (+)   Anesthesia Other Findings   Reproductive/Obstetrics negative OB ROS                         Anesthesia Physical Anesthesia Plan  ASA: III  Anesthesia Plan: Spinal, Regional and MAC   Post-op Pain Management:    Induction:   Airway Management Planned:   Additional Equipment:   Intra-op Plan:   Post-operative Plan:   Informed Consent: I have reviewed the patients  History and Physical, chart, labs and discussed the procedure including the risks, benefits and alternatives for the proposed anesthesia with the patient or authorized representative who has indicated his/her understanding and acceptance.   Dental advisory given  Plan Discussed with: CRNA  Anesthesia Plan Comments:        Anesthesia Quick Evaluation

## 2015-03-22 NOTE — Progress Notes (Signed)
Orthopedic Tech Progress Note Patient Details:  [name] [facility] 03-Jul-1941 [id number]  CPM Left Knee CPM Left Knee: On Left Knee Flexion (Degrees): 40 Left Knee Extension (Degrees): 0  Ortho Devices Ortho Device/Splint Location: applied ohf to bed, and footsie roll Ortho Device/Splint Interventions: Ordered, Application   Braulio Bosch 03/22/2015, 4:19 PM

## 2015-03-22 NOTE — Progress Notes (Signed)
Spoke with Ebony Hail regarding order for PTT day of surgery, could not find any medical reason for test; therefore, discontinued.

## 2015-03-22 NOTE — H&P (Signed)
TOTAL KNEE ADMISSION H&P  Patient is being admitted for left total knee arthroplasty.  Subjective:  Chief Complaint:left knee pain.  HPI: [name], 74 y.o. male, has a history of pain and functional disability in the left knee due to arthritis and has failed non-surgical conservative treatments for greater than 12 weeks to includeNSAID's and/or analgesics, corticosteriod injections, flexibility and strengthening excercises, use of assistive devices and activity modification.  Onset of symptoms was gradual, starting >10 years ago with gradually worsening course since that time. The patient noted prior procedures on the knee to include  arthroscopy and menisectomy on the left knee(s).  Patient currently rates pain in the left knee(s) at 9 out of 10 with activity. Patient has night pain, worsening of pain with activity and weight bearing, pain that interferes with activities of daily living, pain with passive range of motion, crepitus and joint swelling.  Patient has evidence of subchondral sclerosis, periarticular osteophytes and joint space narrowing by imaging studies. This patient has had worsening of pain in both knees recently. There is no active infection.  Patient Active Problem List   Diagnosis Date Noted  . Dyspnea on exertion 01/31/2015  . Lumbar radiculopathy 12/12/2014  . Facet hypertrophy of lumbar region 12/12/2014  . Left knee pain 12/12/2014  . Ulcer of lower limb 01/10/2014  . Varicose veins of lower extremities with other complications 18/84/1660  . 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 Renal insufficiency 12/21/2013  . Swelling in head/neck 07/28/2012  . 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 Pain in limb 02/12/2012  . Hyperlipidemia 02/28/2008  . ALLERGIC RHINITIS 02/28/2008  . Sleep apnea 02/28/2008   Past Medical History  Diagnosis Date  . 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 Sleep apnea   . 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 Allergy     takes Zyrtec daily  . Neuromuscular disorder   . Arthritis   . Back pain   . Dyspnea on exertion   . Hyperlipidemia   . Depression     takes Wellbutrin daily  . GERD (gastroesophageal reflux disease)     takes Nexium and Omeprazole daily  . Anxiety     takes Ativan daily as needed  . Hypertension   . History of kidney stones   . Umbilical hernia     Past Surgical History  Procedure Laterality Date  . Epidural steroid injections      every 6 months   Dr. Arlice Colt  . Hernia repair  2003  . Back surgery  2003    L4, L5  . Endovenous ablation saphenous vein w/ laser Right 06-01-2014    EVLA right greater saphenous vein by Curt Jews MD   . Back surgery    . Vascular surgery    . Hernia repair      hernia inguinal  . 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 testosterone cypionate (DEPOTESTOTERONE CYPIONATE) injection 100 mg  100 mg Intramuscular Q7 days Daphane Shepherd, PA-C   100 mg at 02/21/15 1816   Prescriptions prior to admission  Medication Sig Dispense Refill Last Dose  . aspirin 81 MG tablet Take 40.5 mg by mouth daily.    Past Week at Unknown time  . augmented  betamethasone dipropionate (DIPROLENE AF) 0.05 % cream Apply topically 2 (two) times daily. 50 g 0 Past Week at Unknown time  . buPROPion (WELLBUTRIN XL) 300 MG 24 hr tablet TAKE 1 TABLET BY MOUTH EVERY MORNING 90 tablet 1 Past Week at Unknown time  . calcium carbonate 200 MG capsule Take 200 mg by mouth daily.    Past Week at Unknown time  . cetirizine (ZYRTEC) 10 MG tablet Take 10 mg by mouth daily as needed for allergies.   Past Week at Unknown time  . doxazosin (CARDURA) 8 MG tablet TAKE 1 TABLET BY MOUTH ONCE A DAY 90 tablet 1 03/21/2015 at Unknown time  .  esomeprazole (NEXIUM) 20 MG packet Take 20 mg by mouth daily before breakfast.   03/21/2015 at Unknown time  . fluticasone (FLONASE) 50 MCG/ACT nasal spray Place 2 sprays into the nose daily. 16 g 6 Past Week at Unknown time  . furosemide (LASIX) 40 MG tablet Take 1 tablet (40 mg total) by mouth daily. 90 tablet 0 03/21/2015 at Unknown time  . gabapentin (NEURONTIN) 300 MG capsule Take 1 capsule (300 mg total) by mouth 3 (three) times daily. 270 capsule 1 Past Week at Unknown time  . glucosamine-chondroitin 500-400 MG tablet Take 1 tablet by mouth 3 (three) times daily.   03/21/2015 at Unknown time  . HYDROcodone-acetaminophen (NORCO/VICODIN) 5-325 MG per tablet 1 tablet every 6-8 hours as needed for back pain 90 tablet 0 03/21/2015 at Unknown time  . LORazepam (ATIVAN) 1 MG tablet Take one tablet bid prn anxiety 60 tablet 1 Past Week at Unknown time  . meloxicam (MOBIC) 15 MG tablet Take 15 mg by mouth daily.   Past Week at Unknown time  . methocarbamol (ROBAXIN) 500 MG tablet Take 500 mg by mouth every 6 (six) hours as needed for muscle spasms.    Past Week at Unknown time  . Multiple Vitamin (MULTIVITAMIN) tablet Take 1 tablet by mouth daily.   03/21/2015 at Unknown time  . potassium chloride SA (K-DUR,KLOR-CON) 20 MEQ tablet Take 2 tablets (40 mEq total) by mouth daily. 180 tablet 0 03/21/2015 at Unknown time  . sildenafil (REVATIO) 20 MG tablet Take 5 tablets daily as needed for erectile dysfunction. 50 tablet 11 Past Month at Unknown time  . testosterone cypionate (DEPOTESTOTERONE CYPIONATE) 100 MG/ML injection Inject 1 mL (100 mg total) into the muscle every 7 (seven) days. For IM use only 10 mL 0 Past Week at Unknown time  . doxycycline (VIBRA-TABS) 100 MG tablet TAKE 1 TABLET BY MOUTH 2 TIMES DAILY. (Patient not taking: Reported on 03/21/2015) 20 tablet 0 Not Taking at Unknown time  . flurazepam (DALMANE) 15 MG capsule Take 1 capsule (15 mg total) by mouth at bedtime as needed for sleep. 30 capsule 5  More than a month at Unknown time  . mupirocin ointment (BACTROBAN) 2 % Apply topically 2 (two) times daily. PATIENT NEEDS OFFICE VISIT FOR ADDITIONAL REFILLS 22 g 0 More than a month at Unknown time  . omeprazole (PRILOSEC) 40 MG capsule Take 40 mg by mouth daily as needed (reflux).    More than a month at Unknown time   No Known Allergies  History  Substance Use Topics  . Smoking status: Never Smoker   . Smokeless tobacco: Never Used  . Alcohol Use: No    Family History  Problem Relation Age of Onset  . Thyroid disease Mother   . Heart disease Father   . Diabetes Maternal Grandfather  Review of Systems  Constitutional: Negative.   HENT: Negative.   Eyes: Negative.   Respiratory: Negative.   Cardiovascular: Negative.   Gastrointestinal: Negative.   Genitourinary: Negative.   Musculoskeletal: Positive for joint pain.  Skin: Negative.   Neurological: Negative.   Endo/Heme/Allergies: Negative.   Psychiatric/Behavioral: Negative.     Objective:  Physical Exam  Constitutional: He appears well-developed.  HENT:  Head: Normocephalic.  Eyes: Pupils are equal, round, and reactive to light.  Neck: Normal range of motion.  Cardiovascular: Normal rate.   Respiratory: Effort normal.  Neurological: He is alert.  Skin: Skin is warm.  Psychiatric: He has a normal mood and affect.  left knee skin intact - ext mechanism intact - dp 2/4 - df pf 5/5 collaterals stable rom 8 - 120  Vital signs in last 24 hours: Temp:  [97.9 F (36.6 C)-98.4 F (36.9 C)] 97.9 F (36.6 C) (05/12 1020) Pulse Rate:  [56-71] 63 (05/12 1200) Resp:  [18] 18 (05/12 1200) BP: (147-164)/(61-91) 163/84 mmHg (05/12 1200) SpO2:  [95 %-99 %] 99 % (05/12 1200) Weight:  [114.034 kg (251 lb 6.4 oz)] 114.034 kg (251 lb 6.4 oz) (05/12 1020)  Labs:   Estimated body mass index is 36.07 kg/(m^2) as calculated from the following:   Height as of this encounter: 5\' 10"  (1.778 m).   Weight as of this encounter:  114.034 kg (251 lb 6.4 oz).   Imaging Review Plain radiographs demonstrate severe degenerative joint disease of the left knee(s). The overall alignment ismild varus. The bone quality appears to be good for age and reported activity level.  Assessment/Plan:  End stage arthritis, left knee   The patient history, physical examination, clinical judgment of the provider and imaging studies are consistent with end stage degenerative joint disease of the left knee(s) and total knee arthroplasty is deemed medically necessary. The treatment options including medical management, injection therapy arthroscopy and arthroplasty were discussed at length. The risks and benefits of total knee arthroplasty were presented and reviewed. The risks due to aseptic loosening, infection, stiffness, patella tracking problems, thromboembolic complications and other imponderables were discussed. The patient acknowledged the explanation, agreed to proceed with the plan and consent was signed. Patient is being admitted for inpatient treatment for surgery, pain control, PT, OT, prophylactic antibiotics, VTE prophylaxis, progressive ambulation and ADL's and discharge planning. The patient is planning to be discharged to skilled nursing facility

## 2015-03-22 NOTE — Anesthesia Procedure Notes (Addendum)
Procedure Name: Intubation Date/Time: 03/22/2015 12:53 PM Performed by: Scheryl Darter Pre-anesthesia Checklist: Patient identified, Emergency Drugs available, Suction available, Patient being monitored and Timeout performed Patient Re-evaluated:Patient Re-evaluated prior to inductionOxygen Delivery Method: Circle system utilized Preoxygenation: Pre-oxygenation with 100% oxygen Intubation Type: IV induction Ventilation: Two handed mask ventilation required Laryngoscope Size: Miller and 3 Grade View: Grade II Tube type: Oral Tube size: 8.0 mm Number of attempts: 1 Airway Equipment and Method: Stylet Placement Confirmation: ETT inserted through vocal cords under direct vision,  positive ETCO2 and breath sounds checked- equal and bilateral Secured at: 24 cm Tube secured with: Tape Dental Injury: Teeth and Oropharynx as per pre-operative assessment  Comments: Anterior larynx, obesity    Performed by: Scheryl Darter    Anesthesia Regional Block:  Adductor canal block  Pre-Anesthetic Checklist: ,, timeout performed, Correct Patient, Correct Site, Correct Laterality, Correct Procedure, Correct Position, site marked, Risks and benefits discussed, Surgical consent,  Pre-op evaluation,  Post-op pain management  Laterality: Left  Prep: chloraprep       Needles:  Injection technique: Single-shot  Needle Type: Stimiplex     Needle Length: 9cm 9 cm Needle Gauge: 21 and 21 G    Additional Needles:  Procedures: ultrasound guided (picture in chart) Adductor canal block Narrative:  Injection made incrementally with aspirations every 5 mL.  Performed by: Personally  Anesthesiologist: Nolon Nations  Additional Notes: BP cuff, EKG monitors applied. Sedation begun. Artery and nerve location verified with U/S and anesthetic injected incrementally, slowly, and after negative aspirations under direct u/s guidance. Good fascial /perineural spread. Tolerated well.

## 2015-03-22 NOTE — Anesthesia Postprocedure Evaluation (Signed)
Anesthesia Post Note  Patient: [name]  Procedure(s) Performed: Procedure(s) (LRB): TOTAL KNEE ARTHROPLASTY (Left)  Anesthesia type: General  + adductor canal block  Patient location: PACU  Post pain: Pain level controlled  Post assessment: Post-op Vital signs reviewed  Last Vitals: BP 158/89 mmHg  Pulse 70  Temp(Src) 36.4 C (Oral)  Resp 14  Ht 5\' 10"  (1.778 m)  Wt 251 lb 6.4 oz (114.034 kg)  BMI 36.07 kg/m2  SpO2 98%  Post vital signs: Reviewed  Level of consciousness: sedated  Complications: No apparent anesthesia complications

## 2015-03-22 NOTE — Transfer of Care (Signed)
Immediate Anesthesia Transfer of Care Note  Patient: [name]  Procedure(s) Performed: Procedure(s): TOTAL KNEE ARTHROPLASTY (Left)  Patient Location: PACU  Anesthesia Type:General  Level of Consciousness: awake, alert  and patient cooperative  Airway & Oxygen Therapy: Patient Spontanous Breathing and Patient connected to nasal cannula oxygen  Post-op Assessment: Post -op Vital signs reviewed and stable and Patient moving all extremities  Post vital signs: Reviewed and stable  Last Vitals:  Filed Vitals:   03/22/15 1200  BP: 163/84  Pulse: 63  Temp:   Resp: 18    Complications: No apparent anesthesia complications

## 2015-03-22 NOTE — Brief Op Note (Signed)
03/22/2015  3:35 PM  PATIENT:  [name]  74 y.o. male  PRE-OPERATIVE DIAGNOSIS:  LEFT KNEE OSTEOARTHRITIS  POST-OPERATIVE DIAGNOSIS:  LEFT KNEE OSTEOARTHRITIS  PROCEDURE:  Procedure(s): TOTAL KNEE ARTHROPLASTY  SURGEON:  Surgeon(s): Meredith Pel, MD  ASSISTANT: carla bethune and April green rnfa  ANESTHESIA:   general  EBL: 50 ml    Total I/O In: 2000 [I.V.:2000] Out: -   BLOOD ADMINISTERED: none  DRAINS: none   LOCAL MEDICATIONS USED:  exparel marcaine mso4 clonidine  SPECIMEN:  No Specimen  COUNTS:  YES  TOURNIQUET:  96 min at 300 mm hg  DICTATION: .Other Dictation: Dictation Number [id number]  PLAN OF CARE: Admit to inpatient   PATIENT DISPOSITION:  PACU - hemodynamically stable

## 2015-03-23 ENCOUNTER — Inpatient Hospital Stay (HOSPITAL_COMMUNITY): Payer: Medicare Other

## 2015-03-23 ENCOUNTER — Telehealth: Payer: Self-pay

## 2015-03-23 ENCOUNTER — Encounter (HOSPITAL_COMMUNITY): Payer: Self-pay | Admitting: General Practice

## 2015-03-23 LAB — URINE CULTURE: Colony Count: 7000

## 2015-03-23 MED ORDER — RIVAROXABAN 10 MG PO TABS: 10.0000 mg | ORAL_TABLET | Freq: Every day | ORAL | Status: DC

## 2015-03-23 MED ORDER — OXYCODONE HCL 5 MG PO TABS: 5.0000 mg | ORAL_TABLET | ORAL | Status: DC | PRN

## 2015-03-23 MED ORDER — DOCUSATE SODIUM 100 MG PO CAPS: 100.0000 mg | ORAL_CAPSULE | Freq: Two times a day (BID) | ORAL | Status: DC

## 2015-03-23 NOTE — Clinical Social Work Note (Signed)
Clinical Social Work Assessment  Patient Details  Name: [name] MRN: [id number] Date of Birth: 10/28/1941  Date of referral:  03/23/15               Reason for consult:  Facility Placement                Permission sought to share information with:  Facility Sport and exercise psychologist, Other Magazine features editor) Permission granted to share information::  Yes, Verbal Permission Granted  Name::     Manual Meier, Oneta Rack Fax [phone]  Agency::   [facility])  Relationship::     Contact Information:     Housing/Transportation Living arrangements for the past 2 months:  Single Family Home Source of Information:  Patient Patient Interpreter Needed:  None Criminal Activity/Legal Involvement Pertinent to Current Situation/Hospitalization:  Yes ([employer] Monday letter needed-faxed) Significant Relationships:  Adult Children Lives with:    Do you feel safe going back to the place where you live?  No (fall risk) Need for family participation in patient care:     Care giving concerns:  No family at bedside.  Patient is alert and oriented x4.  No involvement necessary.   Social Worker assessment / plan:  CSW met with patient at bedside.  Patient has pre-arranged STR at [place] at time of discharge.  CSW confirmed plan with [facility].  Discharge projected for Sunday.  Employment status:  Retired Forensic scientist:  Medicare PT Recommendations:    Information / Referral to community resources:  [place]  Patient/Family's Response to care:  Patient is appreciative of CSW assistance.  Patient/Family's Understanding of and Emotional Response to Diagnosis, Current Treatment, and Prognosis:  Patient is hopeful to regain "better" mobility in the one leg and total independence.  Patient states that he will have the other knee replaced as soon as this one heals.  Patient presents as appropriate regarding dx and px.  Emotional Assessment Appearance:  Appears younger than  stated age Attitude/Demeanor/Rapport:   (appropriate) Affect (typically observed):  Accepting, Adaptable, Appropriate, Calm, Stable, Pleasant, Hopeful Orientation:  Oriented to Self, Oriented to Place, Oriented to  Time, Oriented to Situation Alcohol / Substance use:  Not Applicable Psych involvement (Current and /or in the community):  No (Comment)  Discharge Needs  Concerns to be addressed:  No discharge needs identified Readmission within the last 30 days:    Current discharge risk:  None Barriers to Discharge:  No Barriers Identified   Nonnie Done, LCSW [phone]  Psychiatric & Orthopedics (5N 1-8) Clinical Social Worker    03/23/2015, 12:21 PM

## 2015-03-23 NOTE — Progress Notes (Signed)
Physical Therapy Treatment Patient Details Name: [name] MRN: [id number] DOB: 14-Jan-1941 Today's Date: 03/23/2015    History of Present Illness Pt is a 74 y/o M s/p L TKA.  Pt's PMH includes DDD L4-S1, dypnea on exertion, depression, anxiety, HTN, and umbilical hernia.    PT Comments    Pt ambulated 60 ft this session and progressed w/ therapeutic exercises this session.  Pt limited by fatigue during ambulation.  Pt will benefit from continued skilled PT services to increase functional independence and safety.  Follow Up Recommendations  SNF;Supervision/Assistance - 24 hour     Equipment Recommendations   (TBD by next venue of care)    Recommendations for Other Services       Precautions / Restrictions Precautions Precautions: Fall;Knee Precaution Comments: Reviewed no pillow under knee; pt did not remember this precaution Required Braces or Orthoses: Knee Immobilizer - Left Knee Immobilizer - Left: Other (comment) (Pt wearing KI upon arrival, no specific order) Restrictions Weight Bearing Restrictions: Yes LLE Weight Bearing: Weight bearing as tolerated    Mobility  Bed Mobility               General bed mobility comments: in recliner  Transfers Overall transfer level: Needs assistance Equipment used: Rolling walker (2 wheeled) Transfers: Sit to/from Stand Sit to Stand: Min assist         General transfer comment: Min assist to stabilize RW during sit>stand and cues to push from chair rather than pull on RW  Ambulation/Gait Ambulation/Gait assistance: Min guard Ambulation Distance (Feet): 60 Feet Assistive device: Rolling walker (2 wheeled) Gait Pattern/deviations: Step-to pattern;Antalgic;Trunk flexed;Decreased stance time - left;Decreased stride length   Gait velocity interpretation: Below normal speed for age/gender General Gait Details: Dec gait speed, WB through BUEs to offload LLE, trunk flexed.  Cues to maintain walker at safe distance from  body for support.     Stairs            Wheelchair Mobility    Modified Rankin (Stroke Patients Only)       Balance Overall balance assessment: Needs assistance Sitting-balance support: Feet supported;No upper extremity supported Sitting balance-Leahy Scale: Fair     Standing balance support: Bilateral upper extremity supported;During functional activity Standing balance-Leahy Scale: Fair                      Cognition Arousal/Alertness: Awake/alert Behavior During Therapy: WFL for tasks assessed/performed Overall Cognitive Status: Within Functional Limits for tasks assessed       Memory: Decreased recall of precautions              Exercises Total Joint Exercises Long Arc Quad: AROM;Left;10 reps;Seated Knee Flexion: AROM;Left;10 reps;Standing Marching in Standing: AROM;Both;10 reps;Standing    General Comments        Pertinent Vitals/Pain Pain Assessment: 0-10 Pain Score: 2  Pain Location: L knee Pain Descriptors / Indicators: Aching;Tightness Pain Intervention(s): Limited activity within patient's tolerance;Monitored during session;Repositioned    Home Living                      Prior Function            PT Goals (current goals can now be found in the care plan section) Acute Rehab PT Goals Patient Stated Goal: to go to rehab Progress towards PT goals: Progressing toward goals    Frequency  7X/week    PT Plan Current plan remains appropriate    Co-evaluation  End of Session Equipment Utilized During Treatment: Gait belt Activity Tolerance: Patient limited by fatigue Patient left: in chair;with call bell/phone within reach     Time: 1426-1446 PT Time Calculation (min) (ACUTE ONLY): 20 min  Charges:  $Gait Training: 8-22 mins                    G Codes:      Joslyn Hy PT, [place] [phone] Pager: [phone] 03/23/2015, 3:11 PM

## 2015-03-23 NOTE — Progress Notes (Signed)
Subjective: Pt stable - pain ok   Objective: Vital signs in last 24 hours: Temp:  [97.4 F (36.3 C)-99.8 F (37.7 C)] 99.8 F (37.7 C) (05/13 0506) Pulse Rate:  [55-70] 65 (05/13 0506) Resp:  [10-22] 15 (05/13 0506) BP: (104-164)/(48-98) 122/55 mmHg (05/13 0506) SpO2:  [91 %-99 %] 95 % (05/13 0506) FiO2 (%):  [28 %] 28 % (05/12 1908) Weight:  [114.034 kg (251 lb 6.4 oz)] 114.034 kg (251 lb 6.4 oz) (05/12 1020)  Intake/Output from previous day: 05/12 0701 - 05/13 0700 In: 2816.3 [I.V.:2816.3] Out: 350 [Urine:325; Blood:25] Intake/Output this shift:    Exam:  Neurovascular intact Sensation intact distally Dorsiflexion/Plantar flexion intact  Labs:  Recent Labs  03/21/15 1412  HGB 15.5    Recent Labs  03/21/15 1412  WBC 5.7  RBC 4.83  HCT 45.2  PLT 158    Recent Labs  03/21/15 1412  NA 137  K 3.9  CL 104  CO2 25  BUN 20  CREATININE 1.76*  GLUCOSE 121*  CALCIUM 9.0    Recent Labs  03/21/15 1412  INR 1.14    Assessment/Plan: Plan cpm and pt today - cpm 6 hours per 8   DEAN,GREGORY SCOTT 03/23/2015, 8:01 AM

## 2015-03-23 NOTE — Op Note (Signed)
NAMEROEN, MACGOWAN NO.:  [id number]  MEDICAL RECORD NO.:  [id number]  LOCATION:  5N02C                        FACILITY:  [facility]  PHYSICIAN:  Anderson Malta, M.D.    DATE OF BIRTH:  [birth year]  DATE OF PROCEDURE:  03/22/2015 DATE OF DISCHARGE:                              OPERATIVE REPORT   PREOPERATIVE DIAGNOSIS:  Left knee degenerative arthritis.  POSTOPERATIVE DIAGNOSIS:  Left knee degenerative arthritis.  PROCEDURE:  Left total knee replacement utilizing Stryker Triathlon posterior cruciate retaining, size 6 femur, 6 tibia, 9 mm polyethylene insert, 38 mm 3-pegged cemented patella.  SURGEON:  Anderson Malta, M.D.  ASSISTANT:  Laure Kidney, RNFA and April Green, RNFA.  INDICATIONS:  [name] is a patient with left knee arthritis, presents for operative management after explanation of risks and benefits and failure of conservative management.  PROCEDURE IN DETAIL:  The patient was brought to the operating room where initially spinal anesthesia was attempted, but it failed thus general anesthetic was utilized.  Left leg was prescrubbed with alcohol and Betadine, allowed to air dry, prepped with DuraPrep solution, draped in a sterile manner.  Charlie Pitter was used to cover the operative field.  Time- out was called.  Left leg elevated and exsanguinated with Esmarch wrap. Tourniquet was inflated.  Anterior approach to knee was made.  Skin and subcutaneous tissues were sharply divided.  Median parapatellar approach was made, marked with #1 Vicryl suture.  Fat pad was partially excised. Lateral patellofemoral ligament released.  Osteophytes removed.  ACL removed.  Minimal soft tissue dissection performed medially enough to place a Z retractor.  Soft tissue removed from the anterior distal femur.  With the knee flexed, guide rod was placed into the tibial canal.  Intramedullary alignment was used to make cut parallel to the mechanical axis of the tibia.   Collateral posterior structures were protected.  11 mm cut made off the least affected lateral tibial plateau.  11 mm cut was required to get 1 to 2 mm under the more affected eburnated medial tibial plateau.  Intramedullary alignment then used, flex rod and distal cut on the femur, this was made at 8 mm. Femur was then sized to a size 6.  Anterior, posterior, and chamfer cuts were made with collaterals protected.  Nine poly spacer then gave symmetric flexion and extension gap, and he did achieve full extension. Osteophytes were removed from the posterior condyles.  Menisci were excised.  Tibia was keel punched.  Patella was prepared cutting down from 24 to 13.  11 mm patella was placed.  Trial components were positioned including a 9 mm spacer.  The patient achieved full extension, full flexion, excellent patellar tracking, no thumbs technique.  The trial implants removed, 3 L of irrigating solution utilized, Exparel injected peripherally into the capsule and joint. Thorough irrigation again performed.  Topical tranexamic acid then utilized for 2 minutes.  Then the components were cemented into position.  Thorough irrigation again performed, excess cement removed. After cement hardening had occurred, tourniquet was released, same stability parameters and range of motion parameters were maintained. Thorough irrigation again performed and the skin edges were anesthetized using Marcaine.  Knee was  then closed using #1 Vicryl suture to reapproximate the arthrotomy, 0-Vicryl suture, 2-0 Vicryl suture, and a running 3-0 Monocryl.  Aquacel dressing applied.  Bulky dressing applied, knee immobilizer.  The patient tolerated the procedure well without immediate complications.  Transferred to the recovery room in a stable condition.     Anderson Malta, M.D.     GSD/MEDQ  D:  03/22/2015  T:  03/23/2015  Job:  [id number]

## 2015-03-23 NOTE — Telephone Encounter (Signed)
If he wants to run by on the way to [facility] rehabilitation and get a Prevnar shot that would be fine. He has had the previous pneumonia 23 vaccine . They may be able to give him the Prevnar at the rehabilitation facility. Let me know what he wants to do

## 2015-03-23 NOTE — Care Management Note (Signed)
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 Patient underwent a left total knee arthroplasty.                  Action/Plan:   Patient is for shortterm rehab at [facility]. Will go to [facility].                    Expected Discharge Date: 03/25/15                  Expected Discharge Plan: Skilled Nursing Facility   In-House Referral:  Clinical Social Work  Discharge planning Services  CM Consult  Post Acute Care Choice:  NA Choice offered to:  NA  DME Arranged:    DME Agency:     HH Arranged:    [place] Agency:     Status of Service:  Completed, signed off  Medicare Important Message Given:    Date Medicare IM Given:    Medicare IM give by:    Date Additional Medicare IM Given:    Additional Medicare Important Message give by:     If discussed at [place] of Stay Meetings, dates discussed:    Additional Comments:  Ninfa Meeker, RN 03/23/2015, 10:11 AM

## 2015-03-23 NOTE — Discharge Instructions (Addendum)
Rivaroxaban oral tablets °What is this medicine? °RIVAROXABAN (ri va ROX a ban) is an anticoagulant (blood thinner). It is used to treat blood clots in the lungs or in the veins. It is also used after knee or hip surgeries to prevent blood clots. It is also used to lower the chance of stroke in people with a medical condition called atrial fibrillation. °This medicine may be used for other purposes; ask your health care provider or pharmacist if you have questions. °COMMON BRAND NAME(S): Xarelto, Xarelto Starter Pack °What should I tell my health care provider before I take this medicine? °They need to know if you have any of these conditions: °-bleeding disorders °-bleeding in the brain °-blood in your stools (black or tarry stools) or if you have blood in your vomit °-history of stomach bleeding °-kidney disease °-liver disease °-low blood counts, like low white cell, platelet, or red cell counts °-recent or planned spinal or epidural procedure °-take medicines that treat or prevent blood clots °-an unusual or allergic reaction to rivaroxaban, other medicines, foods, dyes, or preservatives °-pregnant or trying to get pregnant °-breast-feeding °How should I use this medicine? °Take this medicine by mouth with a glass of water. Follow the directions on the prescription label. Take your medicine at regular intervals. Do not take it more often than directed. Do not stop taking except on your doctor's advice. Stopping this medicine may increase your risk of a blot clot. Be sure to refill your prescription before you run out of medicine. °If you are taking this medicine after hip or knee replacement surgery, take it with or without food. If you are taking this medicine for atrial fibrillation, take it with your evening meal. If you are taking this medicine to treat blood clots, take it with food at the same time each day. If you are unable to swallow your tablet, you may crush the tablet and mix it in applesauce. Then,  immediately eat the applesauce. You should eat more food right after you eat the applesauce containing the crushed tablet. °Talk to your pediatrician regarding the use of this medicine in children. Special care may be needed. °Overdosage: If you think you have taken too much of this medicine contact a poison control center or emergency room at once. °NOTE: This medicine is only for you. Do not share this medicine with others. °What if I miss a dose? °If you take your medicine once a day and miss a dose, take the missed dose as soon as you remember. If you take your medicine twice a day and miss a dose, take the missed dose immediately. In this instance, 2 tablets may be taken at the same time. The next day you should take 1 tablet twice a day as directed. °What may interact with this medicine? °-aspirin and aspirin-like medicines °-certain antibiotics like erythromycin, azithromycin, and clarithromycin °-certain medicines for fungal infections like ketoconazole and itraconazole °-certain medicines for irregular heart beat like amiodarone, quinidine, dronedarone °-certain medicines for seizures like carbamazepine, phenytoin °-certain medicines that treat or prevent blood clots like warfarin, enoxaparin, and dalteparin °-conivaptan °-diltiazem °-felodipine °-indinavir °-lopinavir; ritonavir °-NSAIDS, medicines for pain and inflammation, like ibuprofen or naproxen °-ranolazine °-rifampin °-ritonavir °-St. John's wort °-verapamil °This list may not describe all possible interactions. Give your health care provider a list of all the medicines, herbs, non-prescription drugs, or dietary supplements you use. Also tell them if you smoke, drink alcohol, or use illegal drugs. Some items may interact with your medicine. °What   should I watch for while using this medicine? Visit your doctor or health care professional for regular checks on your progress. Your condition will be monitored carefully while you are receiving this  medicine. Notify your doctor or health care professional and seek emergency treatment if you develop breathing problems; changes in vision; chest pain; severe, sudden headache; pain, swelling, warmth in the leg; trouble speaking; sudden numbness or weakness of the face, arm, or leg. These can be signs that your condition has gotten worse. If you are going to have surgery, tell your doctor or health care professional that you are taking this medicine. Tell your health care professional that you use this medicine before you have a spinal or epidural procedure. Sometimes people who take this medicine have bleeding problems around the spine when they have a spinal or epidural procedure. This bleeding is very rare. If you have a spinal or epidural procedure while on this medicine, call your health care professional immediately if you have back pain, numbness or tingling (especially in your legs and feet), muscle weakness, paralysis, or loss of bladder or bowel control. Avoid sports and activities that might cause injury while you are using this medicine. Severe falls or injuries can cause unseen bleeding. Be careful when using sharp tools or knives. Consider using an Copy. Take special care brushing or flossing your teeth. Report any injuries, bruising, or red spots on the skin to your doctor or health care professional. What side effects may I notice from receiving this medicine? Side effects that you should report to your doctor or health care professional as soon as possible: -allergic reactions like skin rash, itching or hives, swelling of the face, lips, or tongue -back pain -redness, blistering, peeling or loosening of the skin, including inside the mouth -signs and symptoms of bleeding such as bloody or black, tarry stools; red or dark-brown urine; spitting up blood or brown material that looks like coffee grounds; red spots on the skin; unusual bruising or bleeding from the eye, gums, or  nose Side effects that usually do not require medical attention (Report these to your doctor or health care professional if they continue or are bothersome.): -dizziness -muscle pain This list may not describe all possible side effects. Call your doctor for medical advice about side effects. You may report side effects to FDA at 1-800-FDA-1088. Where should I keep my medicine? Keep out of the reach of children. Store at room temperature between 15 and 30 degrees C (59 and 86 degrees F). Throw away any unused medicine after the expiration date. NOTE: This sheet is a summary. It may not cover all possible information. If you have questions about this medicine, talk to your doctor, pharmacist, or health care provider.  2015, [employer]vier/Gold Standard. (2014-02-16 18:47:48) INSTRUCTIONS AFTER JOINT REPLACEMENT   o Remove items at home which could result in a fall. This includes throw rugs or furniture in walking pathways o ICE to the affected joint every three hours while awake for 30 minutes at a time, for at least the first 3-5 days, and then as needed for pain and swelling.  Continue to use ice for pain and swelling. You may notice swelling that will progress down to the foot and ankle.  This is normal after surgery.  Elevate your leg when you are not up walking on it.   o Continue to use the breathing machine you got in the hospital (incentive spirometer) which will help keep your temperature down.  It is  common for your temperature to cycle up and down following surgery, especially at night when you are not up moving around and exerting yourself.  The breathing machine keeps your lungs expanded and your temperature down.   DIET:  As you were doing prior to hospitalization, we recommend a well-balanced diet.  DRESSING / WOUND CARE / SHOWERING  Keep the surgical dressing until follow up.  The dressing is water proof, so you can shower without any extra covering.  IF THE DRESSING FALLS OFF or the  wound gets wet inside, change the dressing with sterile gauze.  Please use good hand washing techniques before changing the dressing.  Do not use any lotions or creams on the incision until instructed by your surgeon.    ACTIVITY  o Increase activity slowly as tolerated, but follow the weight bearing instructions below.   o No driving for 6 weeks or until further direction given by your physician.  You cannot drive while taking narcotics.  o No lifting or carrying greater than 10 lbs. until further directed by your surgeon. o Avoid periods of inactivity such as sitting longer than an hour when not asleep. This helps prevent blood clots.  o You may return to work once you are authorized by your doctor.     WEIGHT BEARING   Weight bearing as tolerated with assist device (walker, cane, etc) as directed, use it as long as suggested by your surgeon or therapist, typically at least 4-6 weeks.   EXERCISES  Results after joint replacement surgery are often greatly improved when you follow the exercise, range of motion and muscle strengthening exercises prescribed by your doctor. Safety measures are also important to protect the joint from further injury. Any time any of these exercises cause you to have increased pain or swelling, decrease what you are doing until you are comfortable again and then slowly increase them. If you have problems or questions, call your caregiver or physical therapist for advice.   Rehabilitation is important following a joint replacement. After just a few days of immobilization, the muscles of the leg can become weakened and shrink (atrophy).  These exercises are designed to build up the tone and strength of the thigh and leg muscles and to improve motion. Often times heat used for twenty to thirty minutes before working out will loosen up your tissues and help with improving the range of motion but do not use heat for the first two weeks following surgery (sometimes heat  can increase post-operative swelling).   These exercises can be done on a training (exercise) mat, on the floor, on a table or on a bed. Use whatever works the best and is most comfortable for you.    Use music or television while you are exercising so that the exercises are a pleasant break in your day. This will make your life better with the exercises acting as a break in your routine that you can look forward to.   Perform all exercises about fifteen times, three times per day or as directed.  You should exercise both the operative leg and the other leg as well.  Exercises include:    Quad Sets - Tighten up the muscle on the front of the thigh (Quad) and hold for 5-10 seconds.    Straight Leg Raises - With your knee straight (if you were given a brace, keep it on), lift the leg to 60 degrees, hold for 3 seconds, and slowly lower the leg.  Perform this  exercise against resistance later as your leg gets stronger.   Leg Slides: Lying on your back, slowly slide your foot toward your buttocks, bending your knee up off the floor (only go as far as is comfortable). Then slowly slide your foot back down until your leg is flat on the floor again.   Angel Wings: Lying on your back spread your legs to the side as far apart as you can without causing discomfort.   Hamstring Strength:  Lying on your back, push your heel against the floor with your leg straight by tightening up the muscles of your buttocks.  Repeat, but this time bend your knee to a comfortable angle, and push your heel against the floor.  You may put a pillow under the heel to make it more comfortable if necessary.   A rehabilitation program following joint replacement surgery can speed recovery and prevent re-injury in the future due to weakened muscles. Contact your doctor or a physical therapist for more information on knee rehabilitation.    CONSTIPATION  Constipation is defined medically as fewer than three stools per week and  severe constipation as less than one stool per week.  Even if you have a regular bowel pattern at home, your normal regimen is likely to be disrupted due to multiple reasons following surgery.  Combination of anesthesia, postoperative narcotics, change in appetite and fluid intake all can affect your bowels.   YOU MUST use at least one of the following options; they are listed in order of increasing strength to get the job done.  They are all available over the counter, and you may need to use some, POSSIBLY even all of these options:    Drink plenty of fluids (prune juice may be helpful) and high fiber foods Colace 100 mg by mouth twice a day  Senokot for constipation as directed and as needed Dulcolax (bisacodyl), take with full glass of water  Miralax (polyethylene glycol) once or twice a day as needed.  If you have tried all these things and are unable to have a bowel movement in the first 3-4 days after surgery call either your surgeon or your primary doctor.    If you experience loose stools or diarrhea, hold the medications until you stool forms back up.  If your symptoms do not get better within 1 week or if they get worse, check with your doctor.  If you experience "the worst abdominal pain ever" or develop nausea or vomiting, please contact the office immediately for further recommendations for treatment.   ITCHING:  If you experience itching with your medications, try taking only a single pain pill, or even half a pain pill at a time.  You can also use Benadryl over the counter for itching or also to help with sleep.   TED HOSE STOCKINGS:  Use stockings on both legs until for at least 2 weeks or as directed by physician office. They may be removed at night for sleeping.  MEDICATIONS:  See your medication summary on the After Visit Summary that nursing will review with you.  You may have some home medications which will be placed on hold until you complete the course of blood thinner  medication.  It is important for you to complete the blood thinner medication as prescribed.  PRECAUTIONS:  If you experience chest pain or shortness of breath - call 911 immediately for transfer to the hospital emergency department.   If you develop a fever greater that 101 F, purulent  drainage from wound, increased redness or drainage from wound, foul odor from the wound/dressing, or calf pain - CONTACT YOUR SURGEON.                                                   FOLLOW-UP APPOINTMENTS:  If you do not already have a post-op appointment, please call the office for an appointment to be seen by your surgeon.  Guidelines for how soon to be seen are listed in your After Visit Summary, but are typically between 1-4 weeks after surgery.  OTHER INSTRUCTIONS:   Knee Replacement:  Do not place pillow under knee, focus on keeping the knee straight while resting. CPM instructions: 0-90 degrees, 2 hours in the morning, 2 hours in the afternoon, and 2 hours in the evening. Place foam block, curve side up under heel at all times except when in CPM or when walking.  DO NOT modify, tear, cut, or change the foam block in any way.  MAKE SURE YOU:   Understand these instructions.   Get help right away if you are not doing well or get worse.    Thank you for letting us be a part of your medical care team.  It is a privilege we respect greatly.  We hope these instructions will help you stay on track for a fast and full recovery!

## 2015-03-23 NOTE — Progress Notes (Signed)
OT Cancellation Note  Patient Details Name: [name] MRN: [id number] DOB: 03/03/1941   Cancelled Treatment:    Reason Eval/Treat Not Completed: OT screened, no needs identified, will sign off. Plan is for patient to discharge > SNF. No acute OT needs identified, all needs can be met in SNF. Please send text page to OT services if any questions, concerns, or with new orders: [phone] OR call office at [phone]. Thank you for the order.   CLAY,PATRICIA , MS, OTR/L, CLT Pager: [phone]  03/23/2015, 10:13 AM 

## 2015-03-23 NOTE — Discharge Summary (Signed)
Physician Discharge Summary  Patient ID: [name] MRN: [id number] DOB/AGE: Aug 15, 1941 74 y.o.  Admit date: 03/22/2015 Discharge date: 03/25/2015  Admission Diagnoses:  Active Problems:   Degenerative arthritis of knee   Discharge Diagnoses:  Same  Surgeries: Procedure(s): TOTAL KNEE ARTHROPLASTY on 03/22/2015   Consultants:    Discharged Condition: Stable  Hospital Course: [name] is an 74 y.o. male who was admitted 03/22/2015 with a chief complaint of left knee pain, and found to have a diagnosis of left knee arthritis.  They were brought to the operating room on 03/22/2015 and underwent the above named procedures. He tolerated the procedure well and was mobilized with PT on POD 1. Made slow but steady progress with PT and was transferred to snf on POD 3   Antibiotics given:  Anti-infectives    Start     Dose/Rate Route Frequency Ordered Stop   03/22/15 2200  ceFAZolin (ANCEF) IVPB 2 g/50 mL premix     2 g 100 mL/hr over 30 Minutes Intravenous 3 times per day 03/22/15 1907 03/23/15 0645   03/22/15 1100  ceFAZolin (ANCEF) IVPB 2 g/50 mL premix     2 g 100 mL/hr over 30 Minutes Intravenous To Surgery 03/21/15 1327 03/22/15 1231    .  Recent vital signs:  Filed Vitals:   03/23/15 0506  BP: 122/55  Pulse: 65  Temp: 99.8 F (37.7 C)  Resp: 15    Recent laboratory studies:  Results for orders placed or performed during the hospital encounter of 03/21/15  Surgical pcr screen  Result Value Ref Range   MRSA, PCR NEGATIVE NEGATIVE   Staphylococcus aureus NEGATIVE NEGATIVE  Urine culture  Result Value Ref Range   Specimen Description URINE, CLEAN CATCH    Special Requests NONE    Colony Count      7,000 COLONIES/ML Performed at [facility]    Culture      INSIGNIFICANT GROWTH Performed at [facility]    Report Status 03/23/2015 FINAL   Basic metabolic panel  Result Value Ref Range   Sodium 137 135 - 145 mmol/L   Potassium 3.9 3.5  - 5.1 mmol/L   Chloride 104 101 - 111 mmol/L   CO2 25 22 - 32 mmol/L   Glucose, Bld 121 (H) 70 - 99 mg/dL   BUN 20 6 - 20 mg/dL   Creatinine, Ser 1.76 (H) 0.61 - 1.24 mg/dL   Calcium 9.0 8.9 - 10.3 mg/dL   GFR calc non Af Amer 37 (L) >60 mL/min   GFR calc Af Amer 42 (L) >60 mL/min   Anion gap 8 5 - 15  CBC  Result Value Ref Range   WBC 5.7 4.0 - 10.5 K/uL   RBC 4.83 4.22 - 5.81 MIL/uL   Hemoglobin 15.5 13.0 - 17.0 g/dL   HCT 45.2 39.0 - 52.0 %   MCV 93.6 78.0 - 100.0 fL   MCH 32.1 26.0 - 34.0 pg   MCHC 34.3 30.0 - 36.0 g/dL   RDW 11.9 11.5 - 15.5 %   Platelets 158 150 - 400 K/uL  Urinalysis, Routine w reflex microscopic  Result Value Ref Range   Color, Urine YELLOW YELLOW   APPearance CLEAR CLEAR   Specific Gravity, Urine 1.012 1.005 - 1.030   pH 5.0 5.0 - 8.0   Glucose, UA NEGATIVE NEGATIVE mg/dL   Hgb urine dipstick NEGATIVE NEGATIVE   Bilirubin Urine NEGATIVE NEGATIVE   Ketones, ur NEGATIVE NEGATIVE mg/dL  Protein, ur NEGATIVE NEGATIVE mg/dL   Urobilinogen, UA 0.2 0.0 - 1.0 mg/dL   Nitrite NEGATIVE NEGATIVE   Leukocytes, UA NEGATIVE NEGATIVE  PT- INR at PAT visit (Pre-admission Testing)  Result Value Ref Range   Prothrombin Time 14.7 11.6 - 15.2 seconds   INR 1.14 0.00 - 1.49  Type and screen  Result Value Ref Range   ABO/RH(D) O POS    Antibody Screen NEG    Sample Expiration 03/24/2015     Discharge Medications:     Medication List    STOP taking these medications        aspirin 81 MG tablet     doxycycline 100 MG tablet  Commonly known as:  VIBRA-TABS     glucosamine-chondroitin 500-400 MG tablet     HYDROcodone-acetaminophen 5-325 MG per tablet  Commonly known as:  NORCO/VICODIN     meloxicam 15 MG tablet  Commonly known as:  MOBIC     mupirocin ointment 2 %  Commonly known as:  BACTROBAN     testosterone cypionate 100 MG/ML injection  Commonly known as:  DEPOTESTOTERONE CYPIONATE      TAKE these medications        augmented  betamethasone dipropionate 0.05 % cream  Commonly known as:  DIPROLENE AF  Apply topically 2 (two) times daily.     buPROPion 300 MG 24 hr tablet  Commonly known as:  WELLBUTRIN XL  TAKE 1 TABLET BY MOUTH EVERY MORNING     calcium carbonate 200 MG capsule  Take 200 mg by mouth daily.     cetirizine 10 MG tablet  Commonly known as:  ZYRTEC  Take 10 mg by mouth daily as needed for allergies.     docusate sodium 100 MG capsule  Commonly known as:  COLACE  Take 1 capsule (100 mg total) by mouth 2 (two) times daily.     doxazosin 8 MG tablet  Commonly known as:  CARDURA  TAKE 1 TABLET BY MOUTH ONCE A DAY     flurazepam 15 MG capsule  Commonly known as:  DALMANE  Take 1 capsule (15 mg total) by mouth at bedtime as needed for sleep.     fluticasone 50 MCG/ACT nasal spray  Commonly known as:  FLONASE  Place 2 sprays into the nose daily.     furosemide 40 MG tablet  Commonly known as:  LASIX  Take 1 tablet (40 mg total) by mouth daily.     gabapentin 300 MG capsule  Commonly known as:  NEURONTIN  Take 1 capsule (300 mg total) by mouth 3 (three) times daily.     LORazepam 1 MG tablet  Commonly known as:  ATIVAN  Take one tablet bid prn anxiety     methocarbamol 500 MG tablet  Commonly known as:  ROBAXIN  Take 500 mg by mouth every 6 (six) hours as needed for muscle spasms.     multivitamin tablet  Take 1 tablet by mouth daily.     NEXIUM 20 MG packet  Generic drug:  esomeprazole  Take 20 mg by mouth daily before breakfast.     omeprazole 40 MG capsule  Commonly known as:  PRILOSEC  Take 40 mg by mouth daily as needed (reflux).     potassium chloride SA 20 MEQ tablet  Commonly known as:  K-DUR,KLOR-CON  Take 2 tablets (40 mEq total) by mouth daily.     rivaroxaban 10 MG Tabs tablet  Commonly known as:  XARELTO  Take 1  tablet (10 mg total) by mouth daily with breakfast.     sildenafil 20 MG tablet  Commonly known as:  REVATIO  Take 5 tablets daily as needed for  erectile dysfunction.        Diagnostic Studies: No results found.  Disposition: 01-Home or Self Care      Discharge Instructions    Call MD / Call 911    Complete by:  As directed   If you experience chest pain or shortness of breath, CALL 911 and be transported to the hospital emergency room.  If you develope a fever above 101 F, pus (white drainage) or increased drainage or redness at the wound, or calf pain, call your surgeon's office.     Constipation Prevention    Complete by:  As directed   Drink plenty of fluids.  Prune juice may be helpful.  You may use a stool softener, such as Colace (over the counter) 100 mg twice a day.  Use MiraLax (over the counter) for constipation as needed.     Diet - low sodium heart healthy    Complete by:  As directed      Discharge instructions    Complete by:  As directed   Weight bearing as tolerated  Keep incision dry cpm and PT 6 hours per day combined     Increase activity slowly as tolerated    Complete by:  As directed               Signed: DEAN,GREGORY SCOTT 03/23/2015, 8:15 AM

## 2015-03-23 NOTE — Evaluation (Addendum)
Physical Therapy Evaluation Patient Details Name: [name] MRN: [id number] DOB: 10/12/1941 Today's Date: 03/23/2015   History of Present Illness  Pt is a 74 y/o M s/p L TKA.  Pt's PMH includes DDD L4-S1, dypnea on exertion, depression, anxiety, HTN, and umbilical hernia.  Clinical Impression  Pt is s/p L TKA resulting in the deficits listed below (see PT Problem List). Pt ambulated 40 ft this session w/ RW.  Pt has no assist at home and has a full flight to get to 2nd floor where bedroom/bathroom is.   Pt will benefit from skilled PT to increase their independence and safety with mobility to allow discharge to the venue listed below.      Follow Up Recommendations SNF;Supervision/Assistance - 24 hour    Equipment Recommendations       Recommendations for Other Services       Precautions / Restrictions Precautions Precautions: Fall;Knee Precaution Comments: Reviewed no pillow under knee; pt did not remember this precaution Required Braces or Orthoses: Knee Immobilizer - Left Knee Immobilizer - Left: Other (comment) (Pt wearing KI upon arrival, no specific order) Restrictions Weight Bearing Restrictions: Yes LLE Weight Bearing: Weight bearing as tolerated      Mobility  Bed Mobility               General bed mobility comments: in recliner  Transfers Overall transfer level: Needs assistance Equipment used: Rolling walker (2 wheeled) Transfers: Sit to/from Stand Sit to Stand: Min assist         General transfer comment: Min assist to stabilize RW during sit>stand and cues to push from chair rather than pull on RW  Ambulation/Gait Ambulation/Gait assistance: Min guard Ambulation Distance (Feet): 60 Feet Assistive device: Rolling walker (2 wheeled) Gait Pattern/deviations: Step-to pattern;Antalgic;Trunk flexed;Decreased stance time - left;Decreased stride length   Gait velocity interpretation: Below normal speed for age/gender General Gait Details: Dec  gait speed, WB through BUEs to offload LLE, trunk flexed.  Cues to maintain walker at safe distance from body for support.    Stairs            Wheelchair Mobility    Modified Rankin (Stroke Patients Only)       Balance Overall balance assessment: Needs assistance Sitting-balance support: Feet supported;No upper extremity supported Sitting balance-Leahy Scale: Fair     Standing balance support: Bilateral upper extremity supported;During functional activity Standing balance-Leahy Scale: Fair                               Pertinent Vitals/Pain Pain Assessment: 0-10 Pain Score: 2  Pain Location: L knee Pain Descriptors / Indicators: Aching;Tightness Pain Intervention(s): Limited activity within patient's tolerance;Monitored during session;Repositioned    Home Living                        Prior Function                 Hand Dominance        Extremity/Trunk Assessment                         Communication      Cognition Arousal/Alertness: Awake/alert Behavior During Therapy: WFL for tasks assessed/performed Overall Cognitive Status: Within Functional Limits for tasks assessed       Memory: Decreased recall of precautions  General Comments      Exercises Total Joint Exercises Long Arc Quad: AROM;Left;10 reps;Seated Knee Flexion: AROM;Left;10 reps;Standing Marching in Standing: AROM;Both;10 reps;Standing      Assessment/Plan    PT Assessment    PT Diagnosis     PT Problem List    PT Treatment Interventions     PT Goals (Current goals can be found in the Care Plan section) Acute Rehab PT Goals Patient Stated Goal: to go to rehab    Frequency 7X/week   Barriers to discharge        Co-evaluation               End of Session Equipment Utilized During Treatment: Gait belt Activity Tolerance: Patient limited by fatigue Patient left: in chair;with call bell/phone within  reach Nurse Communication: Mobility status         Time: 9179-1505 PT Time Calculation (min) (ACUTE ONLY): 20 min   Charges:         PT G CodesJoslyn Hy PT, DPT [phone] Pager: [phone] 03/23/2015, 3:10 PM

## 2015-03-23 NOTE — Plan of Care (Signed)
Problem: Consults Goal: Diagnosis- Total Joint Replacement Outcome: Completed/Met Date Met:  03/23/15 Primary Total Knee Left

## 2015-03-23 NOTE — Telephone Encounter (Signed)
PATIENT STATES HE GOT A "NEW KNEE" ON Wednesday. HE WILL BE GOING TO [facility] NEXT WEEK. HE WOULD LIKE TO KNOW IF HE SHOULD COME IN THE OFFICE TO GET A PNEUMONIA SHOT FIRST? BEST PHONE [phone]  [place]

## 2015-03-24 LAB — CBC
HCT: 40.7 % (ref 39.0–52.0)
Hemoglobin: 13.4 g/dL (ref 13.0–17.0)
MCH: 31.4 pg (ref 26.0–34.0)
MCHC: 32.9 g/dL (ref 30.0–36.0)
MCV: 95.3 fL (ref 78.0–100.0)
Platelets: 136 10*3/uL — ABNORMAL LOW (ref 150–400)
RBC: 4.27 MIL/uL (ref 4.22–5.81)
RDW: 12.4 % (ref 11.5–15.5)
WBC: 8.6 10*3/uL (ref 4.0–10.5)

## 2015-03-24 LAB — BASIC METABOLIC PANEL
Anion gap: 8 (ref 5–15)
BUN: 14 mg/dL (ref 6–20)
CALCIUM: 7.9 mg/dL — AB (ref 8.9–10.3)
CO2: 26 mmol/L (ref 22–32)
CREATININE: 1.68 mg/dL — AB (ref 0.61–1.24)
Chloride: 101 mmol/L (ref 101–111)
GFR calc Af Amer: 45 mL/min — ABNORMAL LOW (ref >60)
GFR calc non Af Amer: 39 mL/min — ABNORMAL LOW (ref >60)
Glucose, Bld: 129 mg/dL — ABNORMAL HIGH (ref 65–99)
POTASSIUM: 3.8 mmol/L (ref 3.5–5.1)
SODIUM: 135 mmol/L (ref 135–145)

## 2015-03-24 MED ORDER — GUAIFENESIN ER 600 MG PO TB12
600.0000 mg | ORAL_TABLET | Freq: Two times a day (BID) | ORAL | Status: DC | PRN
  Administered 2015-03-24 – 2015-03-25 (×2): 600 mg via ORAL
  Filled 2015-03-24 (×2): qty 1

## 2015-03-24 MED ORDER — PNEUMOCOCCAL 13-VAL CONJ VACC IM SUSP
0.5000 mL | INTRAMUSCULAR | Status: AC
  Administered 2015-03-25: 0.5 mL via INTRAMUSCULAR
  Filled 2015-03-24: qty 0.5

## 2015-03-24 MED ORDER — PANTOPRAZOLE SODIUM 40 MG PO TBEC: 40.0000 mg | DELAYED_RELEASE_TABLET | Freq: Every day | ORAL | Status: DC

## 2015-03-24 NOTE — Progress Notes (Signed)
Physical Therapy Treatment Patient Details Name: [name] MRN: [id number] DOB: [birth year] Today's Date: 03/24/2015    History of Present Illness Pt is a 74 y/o M s/p L TKA.  Pt's PMH includes DDD L4-S1, dypnea on exertion, depression, anxiety, HTN, and umbilical hernia.    PT Comments    Patient progressing well although needs incr time to complete exercises.  Will continue to follow patient while on this venue of care to progress mobility.   Follow Up Recommendations  SNF;Supervision/Assistance - 24 hour     Equipment Recommendations  Rolling walker with 5" wheels;3in1 (PT) (TBD by next venue of care)    Recommendations for Other Services       Precautions / Restrictions Precautions Precautions: Fall;Knee Precaution Booklet Issued: Yes (comment) Precaution Comments: Reviewed no pillow under knee with return verbal understanding Required Braces or Orthoses: Knee Immobilizer - Left Knee Immobilizer - Left: Other (comment);On when out of bed or walking (Pt wearing KI upon arrival, no specific order) Restrictions Weight Bearing Restrictions: Yes LLE Weight Bearing: Weight bearing as tolerated    Mobility  Bed Mobility               General bed mobility comments: OOB to chair upon arrival. Patient declined return to bed  Transfers Overall transfer level: Needs assistance Equipment used: Rolling walker (2 wheeled) Transfers: Sit to/from Stand Sit to Stand: Min assist         General transfer comment: min cues for leg management and hand placement  Ambulation/Gait Ambulation/Gait assistance: Min guard Ambulation Distance (Feet): 160 Feet Assistive device: Rolling walker (2 wheeled) Gait Pattern/deviations: Step-through pattern;Trunk flexed     General Gait Details: good lateral weight shift onto Left   Stairs            Wheelchair Mobility    Modified Rankin (Stroke Patients Only)       Balance     Sitting balance-Leahy Scale: Good                              Cognition Arousal/Alertness: Awake/alert Behavior During Therapy: WFL for tasks assessed/performed Overall Cognitive Status: Within Functional Limits for tasks assessed                      Exercises Total Joint Exercises Ankle Circles/Pumps: AROM;Both;10 reps;Seated Quad Sets: AROM;Left;10 reps;Seated Towel Squeeze: AROM;Both;10 reps;Seated Short Arc Quad: AROM;Left;10 reps;Seated Heel Slides: AAROM;Left;10 reps;Seated Hip ABduction/ADduction: AROM;Both;10 reps;Seated Straight Leg Raises: Left;10 reps;Supine;AAROM Long [employer]: Left;10 reps;Seated;AAROM;Other (comment) (assist for terminal knee ext) Knee Flexion: Left;10 reps;AAROM;Seated    General Comments        Pertinent Vitals/Pain Pain Assessment: 0-10 Pain Score: 3  Pain Location: left knee Pain Descriptors / Indicators: Aching;Sore;Pressure;Tiring Pain Intervention(s): Limited activity within patient's tolerance;Monitored during session    Home Living                      Prior Function            PT Goals (current goals can now be found in the care plan section) Acute Rehab PT Goals Patient Stated Goal: to go to [facility] PT Goal Formulation: With patient Time For Goal Achievement: 03/30/15 Potential to Achieve Goals: Good Progress towards PT goals: Progressing toward goals    Frequency  7X/week    PT Plan Current plan remains appropriate    Co-evaluation  End of Session Equipment Utilized During Treatment: Gait belt;Left knee immobilizer Activity Tolerance: Patient tolerated treatment well;No increased pain Patient left: in chair;with call bell/phone within reach     Time: 2202-5427 PT Time Calculation (min) (ACUTE ONLY): 44 min  Charges:  $Gait Training: 8-22 mins $Therapeutic Exercise: 23-37 mins                    G CodesMalka So, [place] [phone]  [place] 03/24/2015, 5:02 PM

## 2015-03-24 NOTE — Progress Notes (Signed)
Patient ID: [name], male   DOB: Jul 08, 1941, 74 y.o.   MRN: [id number] Labs and vitals normal.  Chest x-ray with no evidence of pneumonia.  Apparently will go to [facility].  Will order Protonix and pneumovax.

## 2015-03-24 NOTE — Progress Notes (Signed)
Physical Therapy Treatment Patient Details Name: [name] MRN: [id number] DOB: Jun 11, 1941 Today's Date: 03/24/2015    History of Present Illness Pt is a 74 y/o M s/p L TKA.  Pt's PMH includes DDD L4-S1, dypnea on exertion, depression, anxiety, HTN, and umbilical hernia.    PT Comments    Patient progressing well although he is concerned about sore throat, congestion (productive cough).  Utilized knee immobilizer during gait for safety.  Therapy will continue to follow to assist with progression of mobility and ROM gains.  Follow Up Recommendations  SNF;Supervision/Assistance - 24 hour     Equipment Recommendations  Rolling walker with 5" wheels;3in1 (PT) (TBD by next venue of care)    Recommendations for Other Services       Precautions / Restrictions Precautions Precautions: Fall;Knee Precaution Booklet Issued: Yes (comment) Precaution Comments: Reviewed no pillow under knee with return verbal understanding Required Braces or Orthoses: Knee Immobilizer - Left Knee Immobilizer - Left: Other (comment);On when out of bed or walking (Pt wearing KI upon arrival, no specific order) Restrictions Weight Bearing Restrictions: Yes LLE Weight Bearing: Weight bearing as tolerated    Mobility  Bed Mobility Overal bed mobility: Modified Independent             General bed mobility comments: with raised HOB and Rt rail  Transfers Overall transfer level: Needs assistance Equipment used: Rolling walker (2 wheeled) Transfers: Sit to/from Stand Sit to Stand: Min assist         General transfer comment: mod cues for leg management and hand placement  Ambulation/Gait Ambulation/Gait assistance: Min guard Ambulation Distance (Feet): 110 Feet Assistive device: Rolling walker (2 wheeled) Gait Pattern/deviations: Step-through pattern;Trunk flexed     General Gait Details: good lateral weight shift onto Left   Stairs            Wheelchair Mobility    Modified  Rankin (Stroke Patients Only)       Balance Overall balance assessment: Needs assistance Sitting-balance support: No upper extremity supported;Feet supported Sitting balance-Leahy Scale: Good     Standing balance support: Bilateral upper extremity supported                        Cognition Arousal/Alertness: Awake/alert Behavior During Therapy: WFL for tasks assessed/performed Overall Cognitive Status: Within Functional Limits for tasks assessed                      Exercises Total Joint Exercises Ankle Circles/Pumps: AROM;Both;10 reps;Seated Quad Sets: AROM;Left;10 reps;Seated Towel Squeeze: AROM;Both;10 reps;Seated Hip ABduction/ADduction: AROM;Both;10 reps;Seated Straight Leg Raises: Left;10 reps;Supine;AAROM Long Arc Quad: AROM;Left;10 reps;Seated Knee Flexion: Left;10 reps;AAROM;Seated Goniometric ROM: 89 degrees flexion in sitting    General Comments        Pertinent Vitals/Pain Pain Assessment: 0-10 Pain Score: 2  Pain Location: left knee Pain Descriptors / Indicators: Sore;Aching Pain Intervention(s): Limited activity within patient's tolerance;Monitored during session    Home Living                      Prior Function            PT Goals (current goals can now be found in the care plan section) Acute Rehab PT Goals Patient Stated Goal: to go to [facility] PT Goal Formulation: With patient Time For Goal Achievement: 03/30/15 Potential to Achieve Goals: Good Progress towards PT goals: Progressing toward goals    Frequency  7X/week  PT Plan Current plan remains appropriate    Co-evaluation             End of Session Equipment Utilized During Treatment: Gait belt;Left knee immobilizer Activity Tolerance: Patient tolerated treatment well;No increased pain Patient left: in chair;with call bell/phone within reach     Time: 0856-0952 PT Time Calculation (min) (ACUTE ONLY): 56 min  Charges:  $Gait Training:  8-22 mins $Therapeutic Exercise: 23-37 mins $Therapeutic Activity: 8-22 mins                    G CodesMalka So, [place] [phone]  [place] 03/24/2015, 10:57 AM

## 2015-03-25 DIAGNOSIS — Z89022 Acquired absence of left finger(s): Secondary | ICD-10-CM | POA: Diagnosis not present

## 2015-03-25 DIAGNOSIS — M792 Neuralgia and neuritis, unspecified: Secondary | ICD-10-CM | POA: Diagnosis not present

## 2015-03-25 DIAGNOSIS — N289 Disorder of kidney and ureter, unspecified: Secondary | ICD-10-CM | POA: Diagnosis not present

## 2015-03-25 DIAGNOSIS — K5900 Constipation, unspecified: Secondary | ICD-10-CM | POA: Diagnosis not present

## 2015-03-25 DIAGNOSIS — K219 Gastro-esophageal reflux disease without esophagitis: Secondary | ICD-10-CM | POA: Diagnosis not present

## 2015-03-25 DIAGNOSIS — Z79891 Long term (current) use of opiate analgesic: Secondary | ICD-10-CM | POA: Diagnosis not present

## 2015-03-25 DIAGNOSIS — M1712 Unilateral primary osteoarthritis, left knee: Secondary | ICD-10-CM | POA: Diagnosis not present

## 2015-03-25 DIAGNOSIS — G4730 Sleep apnea, unspecified: Secondary | ICD-10-CM | POA: Diagnosis not present

## 2015-03-25 DIAGNOSIS — M25562 Pain in left knee: Secondary | ICD-10-CM | POA: Diagnosis present

## 2015-03-25 DIAGNOSIS — E785 Hyperlipidemia, unspecified: Secondary | ICD-10-CM | POA: Diagnosis not present

## 2015-03-25 DIAGNOSIS — G4733 Obstructive sleep apnea (adult) (pediatric): Secondary | ICD-10-CM | POA: Diagnosis not present

## 2015-03-25 DIAGNOSIS — E291 Testicular hypofunction: Secondary | ICD-10-CM | POA: Diagnosis not present

## 2015-03-25 DIAGNOSIS — J309 Allergic rhinitis, unspecified: Secondary | ICD-10-CM | POA: Diagnosis not present

## 2015-03-25 DIAGNOSIS — Z96652 Presence of left artificial knee joint: Secondary | ICD-10-CM | POA: Diagnosis not present

## 2015-03-25 DIAGNOSIS — I872 Venous insufficiency (chronic) (peripheral): Secondary | ICD-10-CM | POA: Diagnosis not present

## 2015-03-25 DIAGNOSIS — G4700 Insomnia, unspecified: Secondary | ICD-10-CM | POA: Diagnosis not present

## 2015-03-25 DIAGNOSIS — K210 Gastro-esophageal reflux disease with esophagitis: Secondary | ICD-10-CM | POA: Diagnosis not present

## 2015-03-25 DIAGNOSIS — J302 Other seasonal allergic rhinitis: Secondary | ICD-10-CM | POA: Diagnosis not present

## 2015-03-25 DIAGNOSIS — Z79899 Other long term (current) drug therapy: Secondary | ICD-10-CM | POA: Diagnosis not present

## 2015-03-25 DIAGNOSIS — E876 Hypokalemia: Secondary | ICD-10-CM | POA: Diagnosis not present

## 2015-03-25 DIAGNOSIS — Z471 Aftercare following joint replacement surgery: Secondary | ICD-10-CM | POA: Diagnosis not present

## 2015-03-25 DIAGNOSIS — R278 Other lack of coordination: Secondary | ICD-10-CM | POA: Diagnosis not present

## 2015-03-25 DIAGNOSIS — M6281 Muscle weakness (generalized): Secondary | ICD-10-CM | POA: Diagnosis not present

## 2015-03-25 DIAGNOSIS — F329 Major depressive disorder, single episode, unspecified: Secondary | ICD-10-CM | POA: Diagnosis not present

## 2015-03-25 DIAGNOSIS — M5416 Radiculopathy, lumbar region: Secondary | ICD-10-CM | POA: Diagnosis not present

## 2015-03-25 DIAGNOSIS — F419 Anxiety disorder, unspecified: Secondary | ICD-10-CM | POA: Diagnosis not present

## 2015-03-25 DIAGNOSIS — Z7982 Long term (current) use of aspirin: Secondary | ICD-10-CM | POA: Diagnosis not present

## 2015-03-25 DIAGNOSIS — G629 Polyneuropathy, unspecified: Secondary | ICD-10-CM | POA: Diagnosis not present

## 2015-03-25 DIAGNOSIS — M5136 Other intervertebral disc degeneration, lumbar region: Secondary | ICD-10-CM | POA: Diagnosis not present

## 2015-03-25 DIAGNOSIS — R2681 Unsteadiness on feet: Secondary | ICD-10-CM | POA: Diagnosis not present

## 2015-03-25 NOTE — Progress Notes (Signed)
Physical Therapy Treatment Patient Details Name: [name] MRN: [id number] DOB: 05-Aug-1941 Today's Date: 03/25/2015    History of Present Illness Pt is a 74 y/o M s/p L TKA.  Pt's PMH includes DDD L4-S1, dypnea on exertion, depression, anxiety, HTN, and umbilical hernia.    PT Comments    Progressing well. Possible d/c to [facility] today.  Follow Up Recommendations  SNF;Supervision/Assistance - 24 hour     Equipment Recommendations  Rolling walker with 5" wheels;3in1 (PT)    Recommendations for Other Services       Precautions / Restrictions Precautions Precautions: Fall;Knee Precaution Comments: Reviewed no pillow under knee with return verbal understanding Required Braces or Orthoses: Knee Immobilizer - Left Knee Immobilizer - Left: On when out of bed or walking Restrictions LLE Weight Bearing: Weight bearing as tolerated    Mobility  Bed Mobility Overal bed mobility: Needs Assistance Bed Mobility: Supine to Sit     Supine to sit: Min assist        Transfers   Equipment used: Rolling walker (2 wheeled)   Sit to Stand: Min assist         General transfer comment: verbal cues for hand placement and sequencing  Ambulation/Gait Ambulation/Gait assistance: Min guard Ambulation Distance (Feet): 300 Feet Assistive device: Rolling walker (2 wheeled) Gait Pattern/deviations: Step-through pattern;Trunk flexed   Gait velocity interpretation: Below normal speed for age/gender     Stairs            Wheelchair Mobility    Modified Rankin (Stroke Patients Only)       Balance                                    Cognition Arousal/Alertness: Awake/alert Behavior During Therapy: WFL for tasks assessed/performed Overall Cognitive Status: Within Functional Limits for tasks assessed                      Exercises Total Joint Exercises Ankle Circles/Pumps: AROM;Both;20 reps Quad Sets: AROM;Left;10 reps Heel Slides:  AAROM;Left;10 reps Goniometric ROM: 0-80 L knee AAROM in supine    General Comments        Pertinent Vitals/Pain Pain Assessment: 0-10 Pain Score: 5  Pain Location: left knee Pain Intervention(s): Monitored during session;Repositioned    Home Living                      Prior Function            PT Goals (current goals can now be found in the care plan section) Acute Rehab PT Goals Patient Stated Goal: to go to [facility] PT Goal Formulation: With patient Time For Goal Achievement: 03/30/15 Potential to Achieve Goals: Good Progress towards PT goals: Progressing toward goals    Frequency  7X/week    PT Plan Current plan remains appropriate    Co-evaluation             End of Session Equipment Utilized During Treatment: Gait belt;Left knee immobilizer Activity Tolerance: Patient tolerated treatment well;No increased pain Patient left: Other (comment);with call bell/phone within reach (in bathroom)     Time: 8841-6606 PT Time Calculation (min) (ACUTE ONLY): 32 min  Charges:  $Gait Training: 8-22 mins $Therapeutic Exercise: 8-22 mins                    G Codes:  Lorriane Shire 03/25/2015, 9:17 AM

## 2015-03-25 NOTE — Progress Notes (Signed)
[name] discharged to [facility] per MD order. All questions and concerns answered. Copy of instructions and scripts sent with patient..   Patient escorted by  PTAR.  No distress noted upon discharge.   [name] [place] 03/25/2015 2:00 PM

## 2015-03-25 NOTE — Progress Notes (Signed)
Patient ID: [name], male   DOB: 1941-08-11, 74 y.o.   MRN: [id number] No acute changes.  Looks great overall.  Feeling better.  Incision clean and dry. Calf soft.  Can be discharged to [facility].

## 2015-03-25 NOTE — Progress Notes (Signed)
CPAP has been set on 14cm.

## 2015-03-25 NOTE — Progress Notes (Signed)
Report given to Otila Kluver, Therapist, sports at [facility].

## 2015-03-25 NOTE — Clinical Social Work Placement (Signed)
   CLINICAL SOCIAL WORK PLACEMENT  NOTE  Date:  03/25/2015  Patient Details  Name: [name] MRN: [id number] Date of Birth: 1941/09/10  Clinical Social Work is seeking post-discharge placement for this patient at the [place] level of care (*CSW will initial, date and re-position this form in  chart as items are completed):  Yes   Patient/family provided with [place] Work Department's list of facilities offering this level of care within the geographic area requested by the patient (or if unable, by the patient's family).  Yes   Patient/family informed of their freedom to choose among providers that offer the needed level of care, that participate in Medicare, Medicaid or managed care program needed by the patient, have an available bed and are willing to accept the patient.  Yes   Patient/family informed of [place]'s ownership interest in [facility] and [facility], as well as of the fact that they are under no obligation to receive care at these facilities.  PASRR submitted to EDS on 03/23/15     PASRR number received on 03/23/15     Existing PASRR number confirmed on       FL2 transmitted to all facilities in geographic area requested by pt/family on 03/23/15     FL2 transmitted to all facilities within larger geographic area on       Patient informed that his/her managed care company has contracts with or will negotiate with certain facilities, including the following:        Yes   Patient/family informed of bed offers received.  Patient chooses bed at [facility]     Physician recommends and patient chooses bed at      Patient to be transferred to [facility] on 03/25/15.  Patient to be transferred to facility by Ambulance Corey Harold)     Patient family notified on 03/25/15 of transfer.  Name of family member notified:  Meta Hatchet - daughter; patient called son Randall Hiss as well     PHYSICIAN Please sign FL2, Please prepare  prescriptions, Please prepare priority discharge summary, including medications     Additional Comment: Ok per MD for d/c today to SNF- [facility] for short term rehab care.  Patient's son will bring CPAP to facility; CSW contacted respiratory therapy and parameters for CPAP documented and placed in packet for the SNF.  Nursing notified to call report. No further CSW needs identified. Patient noted to be very pleasant and had positive attitude re: d/c   CSW signing off. Lorie Phenix. Murrell Redden [phone]     _______________________________________________ Williemae Area, LCSW 03/25/2015, 10:55 AM

## 2015-03-26 ENCOUNTER — Other Ambulatory Visit: Payer: Self-pay | Admitting: *Deleted

## 2015-03-26 ENCOUNTER — Non-Acute Institutional Stay (SKILLED_NURSING_FACILITY): Payer: Medicare Other | Admitting: Adult Health

## 2015-03-26 ENCOUNTER — Encounter: Payer: Self-pay | Admitting: Adult Health

## 2015-03-26 DIAGNOSIS — G629 Polyneuropathy, unspecified: Secondary | ICD-10-CM

## 2015-03-26 DIAGNOSIS — K219 Gastro-esophageal reflux disease without esophagitis: Secondary | ICD-10-CM | POA: Diagnosis not present

## 2015-03-26 DIAGNOSIS — M1712 Unilateral primary osteoarthritis, left knee: Secondary | ICD-10-CM

## 2015-03-26 DIAGNOSIS — N289 Disorder of kidney and ureter, unspecified: Secondary | ICD-10-CM | POA: Diagnosis not present

## 2015-03-26 DIAGNOSIS — F329 Major depressive disorder, single episode, unspecified: Secondary | ICD-10-CM | POA: Diagnosis not present

## 2015-03-26 DIAGNOSIS — K5900 Constipation, unspecified: Secondary | ICD-10-CM

## 2015-03-26 DIAGNOSIS — F32A Depression, unspecified: Secondary | ICD-10-CM

## 2015-03-26 DIAGNOSIS — F419 Anxiety disorder, unspecified: Secondary | ICD-10-CM

## 2015-03-26 DIAGNOSIS — J309 Allergic rhinitis, unspecified: Secondary | ICD-10-CM | POA: Diagnosis not present

## 2015-03-26 DIAGNOSIS — I872 Venous insufficiency (chronic) (peripheral): Secondary | ICD-10-CM

## 2015-03-26 MED ORDER — FLURAZEPAM HCL 15 MG PO CAPS: 15.0000 mg | ORAL_CAPSULE | Freq: Every evening | ORAL | Status: DC | PRN

## 2015-03-26 NOTE — Telephone Encounter (Signed)
[facility] 

## 2015-03-26 NOTE — Telephone Encounter (Signed)
Spoke with patient he states that he [place] IF HE HAD EVER GOTTEN THE PNEUM SHOT BUT HE DID GET ONE IN MAY 2016

## 2015-03-26 NOTE — Telephone Encounter (Signed)
lmom to call back 

## 2015-03-27 ENCOUNTER — Encounter (HOSPITAL_COMMUNITY): Payer: Self-pay | Admitting: Orthopedic Surgery

## 2015-03-27 ENCOUNTER — Non-Acute Institutional Stay (SKILLED_NURSING_FACILITY): Payer: Medicare Other | Admitting: Internal Medicine

## 2015-03-27 DIAGNOSIS — M1712 Unilateral primary osteoarthritis, left knee: Secondary | ICD-10-CM | POA: Diagnosis not present

## 2015-03-27 DIAGNOSIS — K219 Gastro-esophageal reflux disease without esophagitis: Secondary | ICD-10-CM

## 2015-03-27 DIAGNOSIS — F32A Depression, unspecified: Secondary | ICD-10-CM

## 2015-03-27 DIAGNOSIS — I872 Venous insufficiency (chronic) (peripheral): Secondary | ICD-10-CM

## 2015-03-27 DIAGNOSIS — M792 Neuralgia and neuritis, unspecified: Secondary | ICD-10-CM

## 2015-03-27 DIAGNOSIS — K5900 Constipation, unspecified: Secondary | ICD-10-CM | POA: Diagnosis not present

## 2015-03-27 DIAGNOSIS — G4700 Insomnia, unspecified: Secondary | ICD-10-CM

## 2015-03-27 DIAGNOSIS — E876 Hypokalemia: Secondary | ICD-10-CM

## 2015-03-27 DIAGNOSIS — F329 Major depressive disorder, single episode, unspecified: Secondary | ICD-10-CM

## 2015-03-27 NOTE — Progress Notes (Addendum)
Patient ID: [name], male   DOB: 14-Mar-1941, 74 y.o.   MRN: [id number]     [facility]   PCP: DAUB, Lina Sayre, MD  Code Status: full code  No Known Allergies  Chief Complaint  Patient presents with  . New Admit To SNF     HPI:  74 year old patient is here for short term rehabilitation post hospital admission from 03/22/15-03/25/15 with degenerative arthritis of his left knee. He underwent left total knee arthroplasty. He is seen in his room today. He has worked with therapy. He mentions being uncomfortable post therapy with soreness in his knee. He has CPM machine on. He would like his gabapentin changed to 300 mg 2 tab at bedtime and potassium supplement changed to his home regimen- does not remember the dosing. His pain is under control with current pain regimen. No other concerns.  Review of Systems:  Constitutional: Negative for fever, chills, diaphoresis.  HENT: Negative for headache, congestion Respiratory: Negative for cough, shortness of breath and wheezing.   Cardiovascular: Negative for chest pain, palpitations, leg swelling.  Gastrointestinal: Negative for heartburn, nausea, vomiting, abdominal pain. Had a bowel movement today Genitourinary: Negative for dysuria Musculoskeletal: Negative for back pain, falls. Has left knee pain Skin: Negative for itching, rash.  Neurological: Negative for dizziness, tingling, focal weakness Psychiatric/Behavioral: Negative for depression   Past Medical History  Diagnosis Date  . 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 Sleep apnea   . Hx of [facility] spotted fever childhood  . Allergy     takes Zyrtec daily  . Neuromuscular disorder   . Arthritis   . Back pain   . Dyspnea on exertion   . Hyperlipidemia   . Depression     takes Wellbutrin daily  . GERD (gastroesophageal reflux disease)     takes Nexium and Omeprazole daily  . Anxiety     takes Ativan daily as needed  .  Hypertension   . History of kidney stones   . Umbilical hernia    Past Surgical History  Procedure Laterality Date  . Epidural steroid injections      every 6 months   Dr. Arlice Colt  . Hernia repair  2003  . Back surgery  2003    L4, L5  . Endovenous ablation saphenous vein w/ laser Right 06-01-2014    EVLA right greater saphenous vein by Curt Jews MD   . Back surgery    . Vascular surgery    . Hernia repair      hernia inguinal  . 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 Total knee arthroplasty Left 03/22/2015    Procedure: TOTAL KNEE ARTHROPLASTY;  Surgeon: Meredith Pel, MD;  Location: [facility];  Service: Orthopedics;  Laterality: Left;   Social History:   reports that he has never smoked. He has never used smokeless tobacco. He reports that he does not drink alcohol or use illicit drugs.  Family History  Problem Relation Age of Onset  . Thyroid disease Mother   . Heart disease Father   . Diabetes Maternal Grandfather     Medications: Patient's Medications  New Prescriptions   No medications on file  Previous Medications   AUGMENTED BETAMETHASONE DIPROPIONATE (DIPROLENE AF) 0.05 % CREAM    Apply topically 2 (two) times daily.   BUPROPION (WELLBUTRIN XL)  300 MG 24 HR TABLET    TAKE 1 TABLET BY MOUTH EVERY MORNING   CALCIUM CARBONATE 200 MG CAPSULE    Take 200 mg by mouth daily.    CETIRIZINE (ZYRTEC) 10 MG TABLET    Take 10 mg by mouth daily as needed for allergies.   DOCUSATE SODIUM (COLACE) 100 MG CAPSULE    Take 1 capsule (100 mg total) by mouth 2 (two) times daily.   DOXAZOSIN (CARDURA) 8 MG TABLET    TAKE 1 TABLET BY MOUTH ONCE A DAY   ESOMEPRAZOLE (NEXIUM) 20 MG PACKET    Take 20 mg by mouth daily before breakfast.   FLURAZEPAM (DALMANE) 15 MG CAPSULE    Take 1 capsule (15 mg total) by mouth at bedtime as needed for sleep.   FLUTICASONE (FLONASE) 50 MCG/ACT NASAL SPRAY    Place 2 sprays into the  nose daily.   FUROSEMIDE (LASIX) 40 MG TABLET    Take 1 tablet (40 mg total) by mouth daily.   GABAPENTIN (NEURONTIN) 300 MG CAPSULE    Take 1 capsule (300 mg total) by mouth 3 (three) times daily.   LORAZEPAM (ATIVAN) 1 MG TABLET    Take one tablet bid prn anxiety   METHOCARBAMOL (ROBAXIN) 500 MG TABLET    Take 500 mg by mouth every 6 (six) hours as needed for muscle spasms.    MULTIPLE VITAMIN (MULTIVITAMIN) TABLET    Take 1 tablet by mouth daily.   OMEPRAZOLE (PRILOSEC) 40 MG CAPSULE    Take 40 mg by mouth daily as needed (reflux).    OXYCODONE (OXY IR/ROXICODONE) 5 MG IMMEDIATE RELEASE TABLET    Take 1-2 tablets (5-10 mg total) by mouth every 3 (three) hours as needed for breakthrough pain.   POTASSIUM CHLORIDE SA (K-DUR,KLOR-CON) 20 MEQ TABLET    Take 2 tablets (40 mEq total) by mouth daily.   RIVAROXABAN (XARELTO) 10 MG TABS TABLET    Take 1 tablet (10 mg total) by mouth daily with breakfast.   SILDENAFIL (REVATIO) 20 MG TABLET    Take 5 tablets daily as needed for erectile dysfunction.  Modified Medications   No medications on file  Discontinued Medications   No medications on file     Physical Exam: Filed Vitals:   03/27/15 1345  BP: 164/88  Pulse: 84  Temp: 99.6 F (37.6 C)  Resp: 18  Weight: 251 lb 9.6 oz (114.125 kg)  SpO2: 96%    General- elderly male, obese, in no acute distress Head- normocephalic, atraumatic Nose- no maxillary or frontal sinus tenderness, no nasal discharge Throat- moist mucus membrane Eyes- no pallor, no icterus, no discharge, normal conjunctiva, normal sclera Neck- no cervical lymphadenopathy, no jugular vein distension Abdomen- bowel sounds present, soft, non tender Musculoskeletal- able to move all 4 extremities, left leg in CPM machine, left knee surgical incision with aquacel dressing, left leg trace edema  Neurological- no focal deficit Skin- warm and dry Psychiatry- alert and oriented to person, place and time, normal mood and  affect    Labs reviewed: Basic Metabolic Panel:  Recent Labs  12/16/14 1617 03/21/15 1412 03/24/15 0721  NA 137 137 135  K 4.0 3.9 3.8  CL 102 104 101  CO2 27 25 26   GLUCOSE 98 121* 129*  BUN 19 20 14   CREATININE 1.37* 1.76* 1.68*  CALCIUM 9.3 9.0 7.9*   Liver Function Tests:  Recent Labs  07/21/14 1756 12/16/14 1617  AST 26 27  ALT 30 32  ALKPHOS  93 83  BILITOT 0.6 0.7  PROT 7.0 6.8  ALBUMIN 4.2 3.9   No results for input(s): LIPASE, AMYLASE in the last 8760 hours. No results for input(s): AMMONIA in the last 8760 hours. 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 Continue robaxin 500 mg q6h prn for muscle spasm, oxyIR 5 mg 1-2 tab q3h prn for pain. Has follow up with orthopedics. Will have him work with physical therapy and occupational therapy team to help with gait training and muscle strengthening exercises.fall precautions. Skin care. Encourage to be out of bed. Continue xarelto for dvt prophylaxis  Venous insufficiency On lasix 40 mg daily and kcl supplement. Check bmp  Depression Continue wellbutrin 300 mg daily  Constipation On colace 100 mg bid. Hydration encouraged  gerd Stable, continue nexium 20 mg daily  Insomnia Continue flurazepam 15 mg qhs prn  Neuropathic pain On neurontin 300 mg tid. Reviewed his office note from February and march from his pcp and cardiology office. He has been on gabapentin 300 mg tid, continue this  Hypokalemia On kcl 20 meq 2 tab daily, pt would like this to be changed to twice a day. Will check bmp and assess further. Also verified dosing in prior OV notes and patient has been on 20 meq 2 tab daily on last cardiology note.  Elevated bp reading BP 164/88 mmHg  Pulse 84  Temp(Src) 99.6 F (37.6 C)   Resp 18  Wt 251 lb 9.6 oz (114.125 kg)  SpO2 96% No know history of HTN. Elevated bp reading today. Check bp q shift for a week anfd if > 3 readings are greater than 140/90, consider adding an antihypertensive    Goals of care: short term rehabilitation   Labs/tests ordered: bmp    Blanchie Serve, MD  [place] (Monday-Friday 8 am - 5 pm) [phone] (afterhours)

## 2015-03-30 ENCOUNTER — Encounter (HOSPITAL_COMMUNITY): Payer: Self-pay | Admitting: Orthopedic Surgery

## 2015-04-02 ENCOUNTER — Non-Acute Institutional Stay (SKILLED_NURSING_FACILITY): Payer: Medicare Other | Admitting: Adult Health

## 2015-04-02 ENCOUNTER — Encounter: Payer: Self-pay | Admitting: Adult Health

## 2015-04-02 DIAGNOSIS — F329 Major depressive disorder, single episode, unspecified: Secondary | ICD-10-CM | POA: Diagnosis not present

## 2015-04-02 DIAGNOSIS — N289 Disorder of kidney and ureter, unspecified: Secondary | ICD-10-CM

## 2015-04-02 DIAGNOSIS — M1712 Unilateral primary osteoarthritis, left knee: Secondary | ICD-10-CM

## 2015-04-02 DIAGNOSIS — K5900 Constipation, unspecified: Secondary | ICD-10-CM

## 2015-04-02 DIAGNOSIS — K219 Gastro-esophageal reflux disease without esophagitis: Secondary | ICD-10-CM | POA: Diagnosis not present

## 2015-04-02 DIAGNOSIS — G629 Polyneuropathy, unspecified: Secondary | ICD-10-CM | POA: Diagnosis not present

## 2015-04-02 DIAGNOSIS — F419 Anxiety disorder, unspecified: Secondary | ICD-10-CM

## 2015-04-02 DIAGNOSIS — I872 Venous insufficiency (chronic) (peripheral): Secondary | ICD-10-CM

## 2015-04-02 DIAGNOSIS — F32A Depression, unspecified: Secondary | ICD-10-CM

## 2015-04-02 DIAGNOSIS — G4700 Insomnia, unspecified: Secondary | ICD-10-CM | POA: Diagnosis not present

## 2015-04-02 DIAGNOSIS — J309 Allergic rhinitis, unspecified: Secondary | ICD-10-CM | POA: Diagnosis not present

## 2015-04-02 NOTE — Progress Notes (Signed)
Patient ID: [name], male   DOB: November 10, 1941, 74 y.o.   MRN: [id number]   03/26/15  Facility:  [facility] Home Location:  [place] Room Number: 307-P LEVEL OF CARE:  SNF (31)   Chief Complaint  Patient presents with  . Hospitalization Follow-up    Osteoarthritis S/P left total knee arthroplasty, depression, obstipation, allergic rhinitis, neuropathy, GERD, renal insufficiency and Venous insufficiency    HISTORY OF PRESENT ILLNESS:  This is a 74 year old male was readmitted to [facility] on 03/25/15 from [facility] with osteoarthritis S/P left total knee arthroplasty. He has PMH of renal insufficiency, hyperlipidemia, allergic rhinitis, sleep apnea, lumbar radiculopathy and hypogonadism.  He has been admitted for a short-term rehabilitation.  PAST MEDICAL HISTORY:  Past Medical History  Diagnosis Date  . 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 Sleep apnea   . Hx of [facility] spotted fever childhood  . Allergy     takes Zyrtec daily  . Neuromuscular disorder   . Arthritis   . Back pain   . Dyspnea on exertion   . Hyperlipidemia   . Depression     takes Wellbutrin daily  . GERD (gastroesophageal reflux disease)     takes Nexium and Omeprazole daily  . Anxiety     takes Ativan daily as needed  . Hypertension   . History of kidney stones   . Umbilical hernia     CURRENT MEDICATIONS: Reviewed per MAR/see medication list  No Known Allergies   REVIEW OF SYSTEMS:  GENERAL: no change in appetite, no fatigue, no weight changes, no fever, chills or weakness RESPIRATORY: no cough, SOB, DOE, wheezing, hemoptysis CARDIAC: no chest pain, edema or palpitations GI: no abdominal pain, diarrhea, constipation, heart burn, nausea or vomiting  PHYSICAL EXAMINATION  GENERAL: no acute distress, obese SKIN:  Left knee surgical incision has aquacel dressing, no redness, dry EYES: conjunctivae normal, sclerae normal,  normal eye lids NECK: supple, trachea midline, no neck masses, no thyroid tenderness, no thyromegaly LYMPHATICS: no LAN in the neck, no supraclavicular LAN RESPIRATORY: breathing is even & unlabored, BS CTAB CARDIAC: RRR, no murmur,no extra heart sounds, LLE edema 1+ GI: abdomen soft, normal BS, no masses, no tenderness, no hepatomegaly, no splenomegaly EXTREMITIES: Able to move 4 extremities PSYCHIATRIC: the patient is alert & oriented to person, affect & behavior appropriate  LABS/RADIOLOGY: Labs reviewed: Basic Metabolic Panel:  Recent Labs  12/16/14 1617 03/21/15 1412 03/24/15 0721  NA 137 137 135  K 4.0 3.9 3.8  CL 102 104 101  CO2 27 25 26   GLUCOSE 98 121* 129*  BUN 19 20 14   CREATININE 1.37* 1.76* 1.68*  CALCIUM 9.3 9.0 7.9*   Liver Function Tests:  Recent Labs  07/21/14 1756 12/16/14 1617  AST 26 27  ALT 30 32  ALKPHOS 93 83  BILITOT 0.6 0.7  PROT 7.0 6.8  ALBUMIN 4.2 3.9   CBC:  Recent Labs  12/16/14 1617 03/21/15 1412 03/24/15 0721  WBC 7.0 5.7 8.6  NEUTROABS 4.8  --   --   HGB 15.4 15.5 13.4  HCT 44.3 45.2 40.7  MCV 93.9 93.6 95.3  PLT 163 158 136*   Lipid Panel:  Recent Labs  12/16/14 1617  HDL 36*    Dg Chest Port 1 View  03/23/2015   CLINICAL DATA:  Acute onset of shortness of breath. Initial encounter.  EXAM: PORTABLE CHEST - 1 VIEW  COMPARISON:  Chest radiograph from 12/16/2014  FINDINGS: The lungs are slightly hypoexpanded. Mild vascular crowding and vascular congestion are seen. Peribronchial thickening is noted. No definite focal airspace consolidation is seen to suggest pneumonia. No pleural effusion or pneumothorax identified.  The cardiomediastinal silhouette is normal in size. No acute osseous abnormalities are identified.  IMPRESSION: Lungs slightly hypoexpanded. Mild vascular congestion noted. Peribronchial thickening noted. No definite focal airspace consolidation seen.   Electronically Signed   By: Garald Balding M.D.   On:  03/23/2015 23:36    ASSESSMENT/PLAN:  Osteoarthritis S/P left total knee arthroplasty - for rehabilitation; continue Xarelto 10 mg 1 tab by mouth daily for DVT prophylaxis; Robaxin 500 mg 1 tab by mouth every 6 hours when necessary for muscle spasm; and oxycodone 5 mg 1-2 tabs by mouth every 3 hours when necessary for pain Depression - mood is stable; continue Wellbutrin XL 300 mg 1 tab by mouth every morning Constipation - continue Colace 100 mg by mouth twice a day Allergic rhinitis - continue Flonase 50 g/ACT 2 nasal sprays in thedaily and Zyrtec 10 mg 1 tab by mouth daily when necessary Neuropathy - continue Neurontin 300 mg 1 capsule by mouth 3 times a day GERD - continue Nexium 20 mg 1 by mouth daily and Prilosec 40 mg 1 by mouth daily when necessary Renal insufficiency - creatinine 1.68; will monitor Venous Insufficiency -  continue Lasix 40 mg 1 tab PO Q D and K-dur 20 meq take 2 tabs PO Q D Anxiety - mood is stable; Ativan 1 mg PO BID PRN   Goals of care:  Short-term rehabilitation   Spent 50 minutes in patient care.    [facility], NP [employer] [phone]

## 2015-04-02 NOTE — Progress Notes (Signed)
Patient ID: [name], male   DOB: 1940-12-05, 74 y.o.   MRN: [id number]   04/02/15  Facility:  Nursing Home Location:  [place] Room Number: 307-P LEVEL OF CARE:  SNF (31)   Chief Complaint  Patient presents with  . Discharge Note    Osteoarthritis S/P left total knee arthroplasty, depression, constipation, insomnia, allergic rhinitis, neuropathy, GERD, renal insufficiency, anxiety and venous insufficiency    HISTORY OF PRESENT ILLNESS:  This is a 74 year old male who is for discharge home with Home health PT. DME: Rolling walker and bedside commode. He has been admitted to [facility] on 03/25/15 from [facility] with osteoarthritis S/P left total knee arthroplasty. He has PMH of renal insufficiency, hyperlipidemia, allergic rhinitis, sleep apnea, lumbar radiculopathy and hypogonadism.  Patient was admitted to this facility for short-term rehabilitation after the patient's recent hospitalization.  Patient has completed SNF rehabilitation and therapy has cleared the patient for discharge.  PAST MEDICAL HISTORY:  Past Medical History  Diagnosis Date  . 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 Sleep apnea   . Hx of [facility] spotted fever childhood  . Allergy     takes Zyrtec daily  . Neuromuscular disorder   . Arthritis   . Back pain   . Dyspnea on exertion   . Hyperlipidemia   . Depression     takes Wellbutrin daily  . GERD (gastroesophageal reflux disease)     takes Nexium and Omeprazole daily  . Anxiety     takes Ativan daily as needed  . Hypertension   . History of kidney stones   . Umbilical hernia     CURRENT MEDICATIONS: Reviewed per MAR/see medication list  No Known Allergies   REVIEW OF SYSTEMS:  GENERAL: no change in appetite, no fatigue, no weight changes, no fever, chills or weakness RESPIRATORY: no cough, SOB, DOE, wheezing, hemoptysis CARDIAC: no chest pain, edema or palpitations GI: no  abdominal pain, diarrhea, constipation, heart burn, nausea or vomiting  PHYSICAL EXAMINATION  GENERAL: no acute distress, obese SKIN:  Left knee surgical incision has aquacel dressing, no redness, dry NECK: supple, trachea midline, no neck masses, no thyroid tenderness, no thyromegaly LYMPHATICS: no LAN in the neck, no supraclavicular LAN RESPIRATORY: breathing is even & unlabored, BS CTAB CARDIAC: RRR, no murmur,no extra heart sounds, LLE edema 1+ GI: abdomen soft, normal BS, no masses, no tenderness, no hepatomegaly, no splenomegaly EXTREMITIES: Able to move 4 extremities PSYCHIATRIC: the patient is alert & oriented to person, affect & behavior appropriate  LABS/RADIOLOGY: Labs reviewed: 03/28/15  sodium 135 potassium 3.9 glucose 87 BUN 19 creatinine 1.46 calcium 8.1 Basic Metabolic Panel:  Recent Labs  12/16/14 1617 03/21/15 1412 03/24/15 0721  NA 137 137 135  K 4.0 3.9 3.8  CL 102 104 101  CO2 27 25 26   GLUCOSE 98 121* 129*  BUN 19 20 14   CREATININE 1.37* 1.76* 1.68*  CALCIUM 9.3 9.0 7.9*   Liver Function Tests:  Recent Labs  07/21/14 1756 12/16/14 1617  AST 26 27  ALT 30 32  ALKPHOS 93 83  BILITOT 0.6 0.7  PROT 7.0 6.8  ALBUMIN 4.2 3.9   CBC:  Recent Labs  12/16/14 1617 03/21/15 1412 03/24/15 0721  WBC 7.0 5.7 8.6  NEUTROABS 4.8  --   --   HGB 15.4 15.5 13.4  HCT 44.3 45.2 40.7  MCV 93.9 93.6 95.3  PLT 163 158  136*   Lipid Panel:  Recent Labs  12/16/14 1617  HDL 36*    Dg Chest Port 1 View  03/23/2015   CLINICAL DATA:  Acute onset of shortness of breath. Initial encounter.  EXAM: PORTABLE CHEST - 1 VIEW  COMPARISON:  Chest radiograph from 12/16/2014  FINDINGS: The lungs are slightly hypoexpanded. Mild vascular crowding and vascular congestion are seen. Peribronchial thickening is noted. No definite focal airspace consolidation is seen to suggest pneumonia. No pleural effusion or pneumothorax identified.  The cardiomediastinal silhouette is  normal in size. No acute osseous abnormalities are identified.  IMPRESSION: Lungs slightly hypoexpanded. Mild vascular congestion noted. Peribronchial thickening noted. No definite focal airspace consolidation seen.   Electronically Signed   By: Garald Balding M.D.   On: 03/23/2015 23:36    ASSESSMENT/PLAN:  Osteoarthritis S/P left total knee arthroplasty - for Home health PT; continue Xarelto 10 mg 1 tab by mouth daily for DVT prophylaxis; Robaxin 500 mg 1 tab by mouth every 6 hours when necessary for muscle spasm; and oxycodone 5 mg 1-2 tabs by mouth every 3 hours when necessary for pain Depression - mood is stable; continue Wellbutrin XL 300 mg 1 tab by mouth every morning Constipation - continue Colace 100 mg by mouth twice a day Allergic rhinitis - continue Flonase 50 g/ACT 2 nasal sprays in thedaily and Zyrtec 10 mg 1 tab by mouth daily when necessary Neuropathy - continue Neurontin 300 mg 2 capsules = 600 mg by mouth Q HS GERD - continue Nexium 20 mg 1 by mouth daily and Prilosec 40 mg 1 by mouth daily when necessary Renal insufficiency - creatinine 1.46; trending down from 5/14 1.68 Venous Insufficiency -  continue Lasix 40 mg 1 tab PO Q D and decrease  K-dur 20 meq take 1 tabs PO Q D Anxiety - mood is stable;  decrease Ativan 0.5 mg 1 tab PO Q 12 hours Insomnia - continue Dalmane 15 mg 1 capsule PO Q HS PRN    I have filled out patient's discharge paperwork and written prescriptions.  Patient will receive home health PT.  DME provided: rolling walker and bedside commode  Total discharge time: Greater than 30 minutes  Discharge time involved coordination of the discharge process with social worker, nursing staff and therapy department. Medical justification for home health services/DME verified.    [facility], NP [employer] [phone]

## 2015-04-03 ENCOUNTER — Other Ambulatory Visit: Payer: Self-pay | Admitting: *Deleted

## 2015-04-03 MED ORDER — LORAZEPAM 0.5 MG PO TABS: ORAL_TABLET | ORAL | Status: DC

## 2015-04-03 NOTE — Telephone Encounter (Signed)
[facility] 

## 2015-04-05 ENCOUNTER — Ambulatory Visit (INDEPENDENT_AMBULATORY_CARE_PROVIDER_SITE_OTHER): Payer: Medicare Other | Admitting: Family Medicine

## 2015-04-05 ENCOUNTER — Telehealth: Payer: Self-pay | Admitting: *Deleted

## 2015-04-05 ENCOUNTER — Ambulatory Visit (INDEPENDENT_AMBULATORY_CARE_PROVIDER_SITE_OTHER): Payer: Medicare Other

## 2015-04-05 VITALS — BP 114/78 | HR 102 | Temp 99.4°F | Resp 18 | Ht 70.0 in | Wt 229.4 lb

## 2015-04-05 DIAGNOSIS — R8299 Other abnormal findings in urine: Secondary | ICD-10-CM | POA: Diagnosis not present

## 2015-04-05 DIAGNOSIS — R52 Pain, unspecified: Secondary | ICD-10-CM | POA: Diagnosis not present

## 2015-04-05 DIAGNOSIS — M5135 Other intervertebral disc degeneration, thoracolumbar region: Secondary | ICD-10-CM

## 2015-04-05 DIAGNOSIS — R8290 Unspecified abnormal findings in urine: Secondary | ICD-10-CM | POA: Diagnosis not present

## 2015-04-05 DIAGNOSIS — R198 Other specified symptoms and signs involving the digestive system and abdomen: Secondary | ICD-10-CM

## 2015-04-05 DIAGNOSIS — M1712 Unilateral primary osteoarthritis, left knee: Secondary | ICD-10-CM | POA: Diagnosis not present

## 2015-04-05 DIAGNOSIS — R42 Dizziness and giddiness: Secondary | ICD-10-CM | POA: Diagnosis not present

## 2015-04-05 LAB — POCT CBC
Granulocyte percent: 74.9 %G (ref 37–80)
HCT, POC: 45.4 % (ref 43.5–53.7)
HEMOGLOBIN: 14.4 g/dL (ref 14.1–18.1)
Lymph, poc: 1.7 (ref 0.6–3.4)
MCH, POC: 29.9 pg (ref 27–31.2)
MCHC: 31.7 g/dL — AB (ref 31.8–35.4)
MCV: 94.3 fL (ref 80–97)
MID (CBC): 0.8 (ref 0–0.9)
MPV: 7.2 fL (ref 0–99.8)
PLATELET COUNT, POC: 427 10*3/uL — AB (ref 142–424)
POC Granulocyte: 7.6 — AB (ref 2–6.9)
POC LYMPH %: 17.1 % (ref 10–50)
POC MID %: 8 %M (ref 0–12)
RBC: 4.82 M/uL (ref 4.69–6.13)
RDW, POC: 13.2 %
WBC: 10.1 10*3/uL (ref 4.6–10.2)

## 2015-04-05 LAB — COMPREHENSIVE METABOLIC PANEL
ALK PHOS: 112 U/L (ref 39–117)
ALT: 23 U/L (ref 0–53)
AST: 22 U/L (ref 0–37)
Albumin: 4 g/dL (ref 3.5–5.2)
BILIRUBIN TOTAL: 0.9 mg/dL (ref 0.2–1.2)
BUN: 22 mg/dL (ref 6–23)
CO2: 26 meq/L (ref 19–32)
CREATININE: 1.5 mg/dL — AB (ref 0.50–1.35)
Calcium: 9.4 mg/dL (ref 8.4–10.5)
Chloride: 99 mEq/L (ref 96–112)
GLUCOSE: 107 mg/dL — AB (ref 70–99)
Potassium: 4.3 mEq/L (ref 3.5–5.3)
Sodium: 137 mEq/L (ref 135–145)
Total Protein: 7.5 g/dL (ref 6.0–8.3)

## 2015-04-05 LAB — POCT UA - MICROSCOPIC ONLY
Bacteria, U Microscopic: NEGATIVE
CASTS, UR, LPF, POC: NEGATIVE
Crystals, Ur, HPF, POC: NEGATIVE
Epithelial cells, urine per micros: NEGATIVE
MUCUS UA: NEGATIVE
RBC, urine, microscopic: NEGATIVE
Yeast, UA: NEGATIVE

## 2015-04-05 LAB — POCT URINALYSIS DIPSTICK
BILIRUBIN UA: NEGATIVE
Blood, UA: NEGATIVE
Glucose, UA: NEGATIVE
KETONES UA: NEGATIVE
NITRITE UA: NEGATIVE
Protein, UA: NEGATIVE
SPEC GRAV UA: 1.015
UROBILINOGEN UA: 0.2
pH, UA: 5.5

## 2015-04-05 NOTE — Telephone Encounter (Signed)
Patient called and stated that he needed a prior authorization for his Xarelto. I called [employer] and requested the prior authorization and tried to explain to them that patient was discharged from facility so usually the facility would initiate that authorization or the patient's primary provider. [employer] pharmacist to fax authorization.

## 2015-04-05 NOTE — Progress Notes (Signed)
Subjective:  Patient ID: [name], male    DOB: 10/03/1941  Age: 74 y.o. MRN: [id number]  74 year old patient who has been seen usually by Dr. Laney Pastor or Dr. Everlene Farrier.  Patient underwent a left total knee arthroplasty 2 weeks ago. It and before that he was having some feeling of fullness in discomfort up into his hiatal hernia and difficulty eating. However since surgery and since being in rehabilitation he has continued on with just not feeling good. He feels weak and tired, with a little exertion he gets worn out easily. He has had some dizziness and lightheadedness and feels like he might go down but he doesn't have room spinning sensation. He's not actually passed out. He has a history of hiatal hernia and that is bothers him some despite taking the Nexium. He has problems with his epigastrium feeling full. He looks at food and doesn't feel like he can eat it. The bowels been moving on a fairly regular basis. He was on hydrocodone 3 surgery, and is currently taking oxycodone for the pain but only taking about 3 times a day. The rest of the time he just lives with pain. He has had decreased mobility of his knee, an orthopedist wants him to keep working it harder and is going to get the mechanical exerciser for him. He is on other medications for hypogonadism, reflux, arms with varicose veins in the past. He is not diabetic. He has been very active prior to surgery. He would like to be able to get back to doing more seen.   Objective:   No major distress. Moderately overweight there is loss some weight through this ordeal. TMs normal. Throat clear. Neck supple without nodes thyromegaly. Chest is clear process. Heart regular without murmurs. Abdomen has normal bowel sounds, soft without masses or tenderness. He does have a little umbilical hernia. Genitorectal exam not done. Extremities unremarkable. Not really swollen.  [facility] reading (PRIMARY) by  Dr. Linna Darner Nonspecific abdomen.  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 and probable compression fracture  Results for orders placed or performed in visit on 04/05/15  POCT CBC  Result Value Ref Range   WBC 10.1 4.6 - 10.2 K/uL   Lymph, poc 1.7 0.6 - 3.4   POC LYMPH PERCENT 17.1 10 - 50 %L   MID (cbc) 0.8 0 - 0.9   POC MID % 8.0 0 - 12 %M   POC Granulocyte 7.6 (A) 2 - 6.9   Granulocyte percent 74.9 37 - 80 %G   RBC 4.82 4.69 - 6.13 M/uL   Hemoglobin 14.4 14.1 - 18.1 g/dL   HCT, POC 45.4 43.5 - 53.7 %   MCV 94.3 80 - 97 fL   MCH, POC 29.9 27 - 31.2 pg   MCHC 31.7 (A) 31.8 - 35.4 g/dL   RDW, POC 13.2 %   Platelet Count, POC 427 (A) 142 - 424 K/uL   MPV 7.2 0 - 99.8 fL  POCT UA - Microscopic Only  Result Value Ref Range   WBC, Ur, HPF, POC 1-10    RBC, urine, microscopic neg    Bacteria, U Microscopic neg    Mucus, UA neg    Epithelial cells, urine per micros neg    Crystals, Ur, HPF, POC neg    Casts, Ur, LPF, POC neg    Yeast, UA neg   POCT urinalysis dipstick  Result Value Ref Range   Color, UA yellow    Clarity, UA clear  Glucose, UA neg    Bilirubin, UA neg    Ketones, UA neg    Spec Grav, UA 1.015    Blood, UA neg    pH, UA 5.5    Protein, UA neg    Urobilinogen, UA 0.2    Nitrite, UA neg    Leukocytes, UA Trace    . Discussed with patient that the urinalysis is not quite normal but not a definite infection. Will get a culture of it. I think that there is little to be done specifically differently for right now pending the results of the additional labs.   Assessment & Plan:   Assessment: Generalized malaise Abdominal fullness History of reflux Status post knee arthroplasty Pain medication therapy Dizziness Hypogonadism  Plan:  Explained needs to give himself the testosterone vaccine when the bowel is been open for over 28 days. That is our office policy.  Patient Instructions  Drink plenty of fluids.   Suggest taking MiraLAX one half to one dose daily as needed to try and  keep bowels moving regularly, but taking regular doses of prunes is another option.  If symptoms are getting worse he is to let me know. I will let him know the results of the labs when they return in the few days.  I think that most of the symptoms will gradually improve with time post surgery.  Give self testosterone shots     Anaysia Germer, MD 04/05/2015

## 2015-04-05 NOTE — Patient Instructions (Signed)
Drink plenty of fluids.   Suggest taking MiraLAX one half to one dose daily as needed to try and keep bowels moving regularly, but taking regular doses of prunes is another option.  If symptoms are getting worse he is to let me know. I will let him know the results of the labs when they return in the few days.  I think that most of the symptoms will gradually improve with time post surgery.  Give self testosterone shots

## 2015-04-06 LAB — URINE CULTURE
COLONY COUNT: NO GROWTH
Organism ID, Bacteria: NO GROWTH

## 2015-04-06 NOTE — Telephone Encounter (Signed)
Submitted Prior Authorization through [email or web address]. Awaiting determination.

## 2015-04-06 NOTE — Telephone Encounter (Signed)
Received fax from [facility] [phone] and medication has been approved. Patient notified.

## 2015-04-10 NOTE — Addendum Note (Signed)
Addendum  created 04/10/15 1706 by Nolon Nations, MD   Modules edited: Anesthesia Blocks and Procedures, Clinical Notes   Clinical Notes:  File: [id number]

## 2015-04-13 ENCOUNTER — Ambulatory Visit: Payer: Medicare Other

## 2015-04-16 ENCOUNTER — Ambulatory Visit (INDEPENDENT_AMBULATORY_CARE_PROVIDER_SITE_OTHER): Payer: Medicare Other

## 2015-04-16 ENCOUNTER — Ambulatory Visit (INDEPENDENT_AMBULATORY_CARE_PROVIDER_SITE_OTHER): Payer: Medicare Other | Admitting: Internal Medicine

## 2015-04-16 VITALS — BP 100/70 | HR 107 | Temp 98.4°F | Resp 17 | Ht 68.75 in | Wt 221.8 lb

## 2015-04-16 DIAGNOSIS — R634 Abnormal weight loss: Secondary | ICD-10-CM

## 2015-04-16 DIAGNOSIS — R5383 Other fatigue: Secondary | ICD-10-CM

## 2015-04-16 DIAGNOSIS — R140 Abdominal distension (gaseous): Secondary | ICD-10-CM

## 2015-04-16 DIAGNOSIS — M79675 Pain in left toe(s): Secondary | ICD-10-CM | POA: Diagnosis not present

## 2015-04-16 DIAGNOSIS — D225 Melanocytic nevi of trunk: Secondary | ICD-10-CM

## 2015-04-16 DIAGNOSIS — R351 Nocturia: Secondary | ICD-10-CM

## 2015-04-16 DIAGNOSIS — R110 Nausea: Secondary | ICD-10-CM

## 2015-04-16 DIAGNOSIS — D235 Other benign neoplasm of skin of trunk: Secondary | ICD-10-CM

## 2015-04-16 DIAGNOSIS — K5909 Other constipation: Secondary | ICD-10-CM

## 2015-04-16 DIAGNOSIS — R0609 Other forms of dyspnea: Secondary | ICD-10-CM

## 2015-04-16 DIAGNOSIS — K2100 Gastro-esophageal reflux disease with esophagitis, without bleeding: Secondary | ICD-10-CM

## 2015-04-16 DIAGNOSIS — B351 Tinea unguium: Secondary | ICD-10-CM

## 2015-04-16 DIAGNOSIS — R0600 Dyspnea, unspecified: Secondary | ICD-10-CM

## 2015-04-16 DIAGNOSIS — R630 Anorexia: Secondary | ICD-10-CM

## 2015-04-16 DIAGNOSIS — Z96652 Presence of left artificial knee joint: Secondary | ICD-10-CM | POA: Diagnosis not present

## 2015-04-16 LAB — POCT CBC
Granulocyte percent: 73.6 %G (ref 37–80)
HCT, POC: 48.7 % (ref 43.5–53.7)
Hemoglobin: 16 g/dL (ref 14.1–18.1)
Lymph, poc: 1.6 (ref 0.6–3.4)
MCH: 30.6 pg (ref 27–31.2)
MCHC: 32.8 g/dL (ref 31.8–35.4)
MCV: 93.2 fL (ref 80–97)
MID (cbc): 0.6 (ref 0–0.9)
MPV: 7.1 fL (ref 0–99.8)
PLATELET COUNT, POC: 240 10*3/uL (ref 142–424)
POC GRANULOCYTE: 5.9 (ref 2–6.9)
POC LYMPH %: 19.5 % (ref 10–50)
POC MID %: 6.9 %M (ref 0–12)
RBC: 5.22 M/uL (ref 4.69–6.13)
RDW, POC: 13.1 %
WBC: 8 10*3/uL (ref 4.6–10.2)

## 2015-04-16 LAB — POCT UA - MICROSCOPIC ONLY
CASTS, UR, LPF, POC: NEGATIVE
Crystals, Ur, HPF, POC: NEGATIVE
Mucus, UA: NEGATIVE
YEAST UA: NEGATIVE

## 2015-04-16 LAB — POCT URINALYSIS DIPSTICK
Bilirubin, UA: NEGATIVE
GLUCOSE UA: NEGATIVE
Ketones, UA: NEGATIVE
Leukocytes, UA: NEGATIVE
Nitrite, UA: NEGATIVE
Protein, UA: NEGATIVE
Spec Grav, UA: <=1.005
Urobilinogen, UA: 0.2
pH, UA: 6

## 2015-04-16 MED ORDER — ONDANSETRON HCL 8 MG PO TABS: 8.0000 mg | ORAL_TABLET | Freq: Three times a day (TID) | ORAL | Status: DC | PRN

## 2015-04-16 MED ORDER — POLYETHYLENE GLYCOL 3350 17 GM/SCOOP PO POWD: 17.0000 g | Freq: Two times a day (BID) | ORAL | Status: DC | PRN

## 2015-04-16 NOTE — Progress Notes (Addendum)
Subjective:  This chart was scribed for [name], MD by Moises Blood, Medical Scribe. This patient was seen in Room 8 and the patient's care was started at 5:34 PM.     Patient ID: [name], male    DOB: February 26, 1941, 74 y.o.   MRN: [id number]  HPI [name] is a 74 y.o. male who presents to [facility] for follow forced significant easy fatigability after having knee replacement 03/22/15. He feels fatigue and feels nauseous after eating a minimal amount, with gagging. He can't breathe easily at some points. He gets tired after getting up and walking a short way, and after 15 minutes, he has to sit down. He initially had some coughing but has since resolved. His appetite is greatly reduced. He has a history of reflux and takes Nexium but his symptoms have been much worse without response. He has been extremely constipated. His abdomen has been distended and he has lots of gas. He has greatly reduced his narcotics post surgery thinking that is the cause of his constipation and even eventually pushed himself to diarrhea with laxatives 2 days ago.  He also mentions having a headache last night and took aspirin to small relief.   He has also had some sharp left toe pain for several days. He has a problem with thickened nails but this seems worse.   He also notes having some intermittent fever, nocturia 3 or 4, but no dysuria. At his evaluation with Dr. Linna Darner on 04/05/2015 he's urine culture was negative. He is on Cardura.  He complains of coldness in his right lower extremity compared to the left.his is not a recent problem and seemed to follow-up venous surgery. He was having swelling in both lower extremities being sedentary after surgery which is improved with use of compression stockings and the administration of Lasix and potassium.  He continues with intense knee pain on the left postsurgical but is improving slowly with physical therapy.  Over the past 2-3 days he has had  intermittent headaches which have responded aspirin. He also feels dizzy when he gets up from sitting. He is sleeping 10-15 hours a day due to his fatigue which is very unlike him.  He notices a tingling feeling on the top of his head and the back of his neck like that of being sunburned over the past week.  He has lost 30 lbs since operation. Unsure if this is due to reduced intake or some other process. His gastrointestinal symptoms get much worse anytime he tries to eat.   he continues on Xarelto for DVT /PE prophylaxis post surgery    Patient Active Problem List   Diagnosis Date Noted  . Degenerative arthritis of knee 03/22/2015  . Dyspnea on exertion 01/31/2015  . Lumbar radiculopathy 12/12/2014  . Facet hypertrophy of lumbar region 12/12/2014  . Left knee pain 12/12/2014  . Ulcer of lower limb 01/10/2014  . Varicose veins of lower extremities with other complications 54/65/0354  . 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 Renal insufficiency 12/21/2013  . Swelling in head/neck 07/28/2012  . Venous (peripheral) insufficiency 05/25/2012  . 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 Pain in limb 02/12/2012  . Hyperlipidemia 02/28/2008  . Allergic rhinitis 02/28/2008  . Sleep apnea 02/28/2008     Current Outpatient Prescriptions on File Prior to Visit  Medication Sig Dispense Refill  . augmented betamethasone dipropionate (DIPROLENE AF) 0.05 % cream Apply topically 2 (two) times daily. 50 g 0  .  buPROPion (WELLBUTRIN XL) 300 MG 24 hr tablet TAKE 1 TABLET BY MOUTH EVERY MORNING 90 tablet 1  . calcium carbonate 200 MG capsule Take 200 mg by mouth daily.     . cetirizine (ZYRTEC) 10 MG tablet Take 10 mg by mouth daily as needed for allergies.    [name] docusate sodium (COLACE) 100 MG capsule Take 1 capsule (100 mg total) by mouth 2 (two) times daily. 10 capsule 0  . doxazosin (CARDURA) 8 MG tablet TAKE 1 TABLET BY MOUTH ONCE A DAY 90 tablet 1  . esomeprazole  (NEXIUM) 20 MG packet Take 20 mg by mouth daily before breakfast.    . flurazepam (DALMANE) 15 MG capsule Take 1 capsule (15 mg total) by mouth at bedtime as needed for sleep. 30 capsule 0  . fluticasone (FLONASE) 50 MCG/ACT nasal spray Place 2 sprays into the nose daily. 16 g 6  . furosemide (LASIX) 40 MG tablet Take 1 tablet (40 mg total) by mouth daily. 90 tablet 0  . gabapentin (NEURONTIN) 300 MG capsule Take 300 mg by mouth at bedtime. Take 2 capsules = 600 mg Q HS    . LORazepam (ATIVAN) 0.5 MG tablet Take one tablet by mouth every 12 hours for anxiety 60 tablet 5  . methocarbamol (ROBAXIN) 500 MG tablet Take 500 mg by mouth every 6 (six) hours as needed for muscle spasms.     . Multiple Vitamin (MULTIVITAMIN) tablet Take 1 tablet by mouth daily.    [name] omeprazole (PRILOSEC) 40 MG capsule Take 40 mg by mouth daily as needed (reflux).     [name] oxyCODONE (OXY IR/ROXICODONE) 5 MG immediate release tablet Take 1-2 tablets (5-10 mg total) by mouth every 3 (three) hours as needed for breakthrough pain. 60 tablet 0  . potassium chloride SA (K-DUR,KLOR-CON) 20 MEQ tablet Take 2 tablets (40 mEq total) by mouth daily. 180 tablet 0  . rivaroxaban (XARELTO) 10 MG TABS tablet Take 1 tablet (10 mg total) by mouth daily with breakfast. 12 tablet 0  . sildenafil (REVATIO) 20 MG tablet Take 5 tablets daily as needed for erectile dysfunction. 50 tablet 11   No current facility-administered medications on file prior to visit.      Review of Systems  Constitutional: Positive for activity change, appetite change, fatigue and unexpected weight change. Negative for fever and chills.  HENT: Positive for trouble swallowing. Negative for mouth sores, rhinorrhea, sinus pressure and sore throat.   Eyes: Negative for visual disturbance.  Respiratory: Positive for choking, chest tightness and shortness of breath. Negative for wheezing.   Cardiovascular: Positive for leg swelling. Negative for chest pain and palpitations.   Gastrointestinal: Positive for nausea, constipation and abdominal distention. Negative for abdominal pain and anal bleeding.  Genitourinary: Positive for frequency. Negative for dysuria.  Musculoskeletal: Positive for gait problem. Negative for back pain and joint swelling.  Skin: Negative for rash.       There is an unusual dark growth on his back which he is concerned about and would like referral to dermatology  Neurological: Positive for weakness, light-headedness and headaches. Negative for tremors, speech difficulty and numbness.  Hematological: Does not bruise/bleed easily.       Objective:   Physical Exam  Constitutional: He is oriented to person, place, and time. He appears well-developed. He appears distressed.  He appears frail  HENT:  Right Ear: External ear normal.  Left Ear: External ear normal.  Nose: Nose normal.  Mouth/Throat: Oropharynx is clear and moist.  Eyes: Conjunctivae and EOM are normal. Pupils are equal, round, and reactive to light.  Neck: Neck supple. No thyromegaly present.  Cardiovascular: Normal rate, regular rhythm, normal heart sounds and intact distal pulses.   No murmur heard. No carotid bruits  Pulmonary/Chest: Effort normal and breath sounds normal. No respiratory distress. He has no wheezes. He has no rales.  Abdominal:  Mildly distended. Tympanitic. No masses or organomegaly. Mildly tender periumbilical and lower quadrants without rebound or guarding  Musculoskeletal: He exhibits no edema.  R lower extrem sl cold/blue when dependent but good perip pulse. The toes are all affected by fungal changes with thickened nails and there is marked tenderness at the tip of the left toe where callus is formed. No erythema. There are no areas of calf tenderness or thigh tenderness to suggest DVT. Sensation is intact in both lower extremities The left knee is still swollen post surgery but without erythema and with a fair range of motion  Lymphadenopathy:     He has no cervical adenopathy.  Neurological: He is alert and oriented to person, place, and time. No cranial nerve deficit.  Skin:  There is an atypical nevus on the right mid back which he is concerned about  Psychiatric: Judgment and thought content normal.    BP 100/70 mmHg  Pulse 107  Temp(Src) 98.4 F (36.9 C) (Oral)  Resp 17  Ht 5' 8.75" (1.746 m)  Wt 221 lb 12.8 oz (100.608 kg)  BMI 33.00 kg/m2      [facility] reading (PRIMARY) by  Dr. Chrissie Noa unchanged//Abd with less air distension but still with lots of stool particularly on the right side Results for orders placed or performed in visit on 04/16/15  POCT CBC  Result Value Ref Range   WBC 8.0 4.6 - 10.2 K/uL   Lymph, poc 1.6 0.6 - 3.4   POC LYMPH PERCENT 19.5 10 - 50 %L   MID (cbc) 0.6 0 - 0.9   POC MID % 6.9 0 - 12 %M   POC Granulocyte 5.9 2 - 6.9   Granulocyte percent 73.6 37 - 80 %G   RBC 5.22 4.69 - 6.13 M/uL   Hemoglobin 16.0 14.1 - 18.1 g/dL   HCT, POC 48.7 43.5 - 53.7 %   MCV 93.2 80 - 97 fL   MCH, POC 30.6 27 - 31.2 pg   MCHC 32.8 31.8 - 35.4 g/dL   RDW, POC 13.1 %   Platelet Count, POC 240 142 - 424 K/uL   MPV 7.1 0 - 99.8 fL  POCT UA - Microscopic Only  Result Value Ref Range   WBC, Ur, HPF, POC 0-2    RBC, urine, microscopic 0-3    Bacteria, U Microscopic trace    Mucus, UA neg    Epithelial cells, urine per micros 0-2    Crystals, Ur, HPF, POC neg    Casts, Ur, LPF, POC neg    Yeast, UA neg   POCT urinalysis dipstick  Result Value Ref Range   Color, UA yellow    Clarity, UA clear    Glucose, UA neg    Bilirubin, UA neg    Ketones, UA neg    Spec Grav, UA <=1.005    Blood, UA small    pH, UA 6.0    Protein, UA neg    Urobilinogen, UA 0.2    Nitrite, UA neg    Leukocytes, UA Negative      Assessment & Plan:  Easy fatigability - Plan:  TSH, Comprehensive metabolic panel///? Secondary to poor nutritional status postoperative with weight loss  Dyspnea on exertion - this may be  secondary to diaphragm pressure from his abdominal distention but will be followed closely on PE prophylaxis  Abdominal distention w/ constipation - Plan: MiraLAX twice a day/small feedings/ Ambulatory referral to Gastroenterology Appetite loss  Loss of weight  Reflux esophagitis relapsing Nausea without vomiting  Nocturia -- secondary to fluid intake  Pain of left great toe - Plan: Ambulatory referral to Podiatry///? Foreign body Onychomycosis  Atypical nevus of right midback - Plan: Ambulatory referral to Dermatology  Somewhat low blood pressure for him-Cont stockings but stop lasix and KCl  He needs close follow-up in 5-7 days or sooner if worse I have completed this 50 min patient encounter in its entirety as documented by the scribe, with editing by me where necessary. Robert P. Laney Pastor, M.D.

## 2015-04-17 ENCOUNTER — Telehealth: Payer: Self-pay

## 2015-04-17 ENCOUNTER — Encounter: Payer: Self-pay | Admitting: Internal Medicine

## 2015-04-17 LAB — COMPREHENSIVE METABOLIC PANEL
ALBUMIN: 3.9 g/dL (ref 3.5–5.2)
ALT: 20 U/L (ref 0–53)
AST: 21 U/L (ref 0–37)
Alkaline Phosphatase: 124 U/L — ABNORMAL HIGH (ref 39–117)
BUN: 14 mg/dL (ref 6–23)
CHLORIDE: 98 meq/L (ref 96–112)
CO2: 30 meq/L (ref 19–32)
CREATININE: 1.4 mg/dL — AB (ref 0.50–1.35)
Calcium: 9.5 mg/dL (ref 8.4–10.5)
GLUCOSE: 91 mg/dL (ref 70–99)
Potassium: 3.6 mEq/L (ref 3.5–5.3)
Sodium: 138 mEq/L (ref 135–145)
TOTAL PROTEIN: 7.6 g/dL (ref 6.0–8.3)
Total Bilirubin: 0.6 mg/dL (ref 0.2–1.2)

## 2015-04-17 LAB — TSH: TSH: 2.021 u[IU]/mL (ref 0.350–4.500)

## 2015-04-17 NOTE — Telephone Encounter (Signed)
Pt wants Dr. Laney Pastor to know that the referral he was set up with can't see him until 06/18/15. He feels this is to far out. He would like to know what Dr. Laney Pastor thinks about this. Please advise at [phone]

## 2015-04-18 NOTE — Telephone Encounter (Signed)
He needs a GI consult in the next 10days for post op problem Try anyone at [facility] even FNP or PA and then try [facility] or Dr Benson [place]

## 2015-04-19 ENCOUNTER — Encounter: Payer: Self-pay | Admitting: Internal Medicine

## 2015-04-19 DIAGNOSIS — M179 Osteoarthritis of knee, unspecified: Secondary | ICD-10-CM | POA: Diagnosis not present

## 2015-04-19 NOTE — Telephone Encounter (Signed)
Referrals can you help with this ?

## 2015-04-20 DIAGNOSIS — M179 Osteoarthritis of knee, unspecified: Secondary | ICD-10-CM | POA: Diagnosis not present

## 2015-04-24 DIAGNOSIS — M179 Osteoarthritis of knee, unspecified: Secondary | ICD-10-CM | POA: Diagnosis not present

## 2015-04-24 NOTE — Telephone Encounter (Signed)
Needs refills on oxyCODONE (OXY IR/ROXICODONE) 5 MG immediate release tablet And testosterone  [phone]

## 2015-04-24 NOTE — Telephone Encounter (Signed)
Advise on refill. 

## 2015-04-26 DIAGNOSIS — M179 Osteoarthritis of knee, unspecified: Secondary | ICD-10-CM | POA: Diagnosis not present

## 2015-04-27 ENCOUNTER — Other Ambulatory Visit: Payer: Self-pay | Admitting: Internal Medicine

## 2015-04-27 DIAGNOSIS — E291 Testicular hypofunction: Secondary | ICD-10-CM

## 2015-04-27 MED ORDER — TESTOSTERONE CYPIONATE 200 MG/ML IM SOLN: 100.0000 mg | INTRAMUSCULAR | Status: DC

## 2015-04-27 MED ORDER — OXYCODONE HCL 5 MG PO TABS: 5.0000 mg | ORAL_TABLET | ORAL | Status: DC | PRN

## 2015-04-30 ENCOUNTER — Ambulatory Visit (INDEPENDENT_AMBULATORY_CARE_PROVIDER_SITE_OTHER): Payer: Medicare Other

## 2015-04-30 ENCOUNTER — Other Ambulatory Visit: Payer: Self-pay | Admitting: Emergency Medicine

## 2015-04-30 DIAGNOSIS — R6881 Early satiety: Secondary | ICD-10-CM | POA: Diagnosis not present

## 2015-04-30 DIAGNOSIS — K219 Gastro-esophageal reflux disease without esophagitis: Secondary | ICD-10-CM | POA: Diagnosis not present

## 2015-04-30 DIAGNOSIS — L539 Erythematous condition, unspecified: Secondary | ICD-10-CM

## 2015-04-30 DIAGNOSIS — E291 Testicular hypofunction: Secondary | ICD-10-CM | POA: Diagnosis not present

## 2015-04-30 DIAGNOSIS — R110 Nausea: Secondary | ICD-10-CM | POA: Diagnosis not present

## 2015-04-30 DIAGNOSIS — E349 Endocrine disorder, unspecified: Secondary | ICD-10-CM

## 2015-04-30 DIAGNOSIS — M179 Osteoarthritis of knee, unspecified: Secondary | ICD-10-CM | POA: Diagnosis not present

## 2015-04-30 NOTE — Progress Notes (Deleted)
   Subjective:    Patient ID: [name], male    DOB: [birth year], 74 y.o.   MRN: [id number]  HPI    Review of Systems     Objective:   Physical Exam        Assessment & Plan:

## 2015-04-30 NOTE — Progress Notes (Signed)
Patient came in today for testosterone injection. Was given 0.36ml in the right upper outer quadrant. [place] [id number] Lot [id number] EXP 10/2016

## 2015-05-02 ENCOUNTER — Telehealth: Payer: Self-pay | Admitting: Family Medicine

## 2015-05-02 ENCOUNTER — Telehealth: Payer: Self-pay

## 2015-05-02 DIAGNOSIS — M546 Pain in thoracic spine: Secondary | ICD-10-CM

## 2015-05-02 DIAGNOSIS — M179 Osteoarthritis of knee, unspecified: Secondary | ICD-10-CM | POA: Diagnosis not present

## 2015-05-02 DIAGNOSIS — N289 Disorder of kidney and ureter, unspecified: Secondary | ICD-10-CM

## 2015-05-02 NOTE — Telephone Encounter (Signed)
Pt would like a CB from [facility]. He said they were talking earlier. Please advise at [phone]

## 2015-05-02 NOTE — Telephone Encounter (Signed)
Patient returned call. He agreed to have renal U/S. Order has been placed.

## 2015-05-02 NOTE — Telephone Encounter (Signed)
Per Dr. Everlene Farrier he did not have Renal U/S last year, he needs to have this done due to renal insufficiency, will he agree for [place] to get this set up for him and have this performed?   Left message to return call

## 2015-05-03 NOTE — Telephone Encounter (Signed)
Patient called back asking for Allie, Dr. Laney Pastor, or Dr. Everlene Farrier.

## 2015-05-04 NOTE — Telephone Encounter (Signed)
Left message for pt to call back  °

## 2015-05-07 ENCOUNTER — Other Ambulatory Visit: Payer: Self-pay | Admitting: Nurse Practitioner

## 2015-05-09 ENCOUNTER — Other Ambulatory Visit: Payer: Self-pay

## 2015-05-09 ENCOUNTER — Ambulatory Visit (INDEPENDENT_AMBULATORY_CARE_PROVIDER_SITE_OTHER): Payer: Medicare Other | Admitting: Radiology

## 2015-05-09 DIAGNOSIS — E349 Endocrine disorder, unspecified: Secondary | ICD-10-CM

## 2015-05-09 DIAGNOSIS — E291 Testicular hypofunction: Secondary | ICD-10-CM | POA: Diagnosis not present

## 2015-05-09 MED ORDER — TESTOSTERONE CYPIONATE 200 MG/ML IM SOLN
100.0000 mg | Freq: Once | INTRAMUSCULAR | Status: AC
  Administered 2015-05-09: 100 mg via INTRAMUSCULAR

## 2015-05-09 MED ORDER — METHOCARBAMOL 500 MG PO TABS: 500.0000 mg | ORAL_TABLET | Freq: Four times a day (QID) | ORAL | Status: DC | PRN

## 2015-05-09 NOTE — Telephone Encounter (Signed)
Pt came in tonight for his Testosterone injection & mentioned that he never received a call back. Patient had two concerns.   1) Pt wants to know why his testosterone was changed from the smaller 122ml bottle to the 27ml bottle. Pt states that he needs to stay on the smaller bottle.  2) pt states that his Hydrocodone was changed to Oxycodone while in rehab & it causes constipation. Pt will be out of his medication and will need a refill next week. He would like to go back on the Hydrocodone.  Please advise  Pt callback: [phone]

## 2015-05-09 NOTE — Addendum Note (Signed)
Addended by: Gustavus Bryant on: 05/09/2015 08:42 PM   Modules accepted: Level of Service

## 2015-05-09 NOTE — Telephone Encounter (Signed)
Dr Laney Pastor, you just saw pt for check up, but don't see this med discussed. Do you want to OK RFs?

## 2015-05-11 MED ORDER — HYDROCODONE-ACETAMINOPHEN 5-325 MG PO TABS: ORAL_TABLET | ORAL | Status: DC

## 2015-05-11 NOTE — Telephone Encounter (Signed)
Ok to change future test orders to 100cc by call in Will change to vicodin Meds ordered this encounter  Medications  . HYDROcodone-acetaminophen (NORCO/VICODIN) 5-325 MG per tablet    Sig: 1 tablet every 6-8 hours as needed for back pain    Dispense:  90 tablet    Refill:  0

## 2015-05-11 NOTE — Telephone Encounter (Signed)
Called pt to let him know. Left message for pt to call back.

## 2015-05-11 NOTE — Telephone Encounter (Signed)
Because he has elevated creatinine he should not use meloxicam on a regular basis===kidney damage

## 2015-05-11 NOTE — Telephone Encounter (Signed)
Spoke with pt, advised message and Rx ready to pick up. He would also would like to request a Rx for Meloxicam 15mg , take one daily. (verified dose at [facility])

## 2015-05-16 NOTE — Telephone Encounter (Signed)
Called pt again.   Left message to call back.

## 2015-05-21 ENCOUNTER — Ambulatory Visit: Payer: Medicare Other | Admitting: Family Medicine

## 2015-05-21 DIAGNOSIS — E291 Testicular hypofunction: Secondary | ICD-10-CM | POA: Diagnosis not present

## 2015-05-21 MED ORDER — TESTOSTERONE CYPIONATE 200 MG/ML IM SOLN
100.0000 mg | Freq: Once | INTRAMUSCULAR | Status: AC
  Administered 2015-05-21: 100 mg via INTRAMUSCULAR

## 2015-05-22 ENCOUNTER — Encounter (HOSPITAL_COMMUNITY): Payer: Self-pay | Admitting: Emergency Medicine

## 2015-05-22 ENCOUNTER — Emergency Department (HOSPITAL_COMMUNITY)
Admission: EM | Admit: 2015-05-22 | Discharge: 2015-05-23 | Disposition: A | Discharge status: Home / Self Care | Payer: Medicare Other | Attending: Emergency Medicine | Admitting: Emergency Medicine

## 2015-05-22 ENCOUNTER — Emergency Department (HOSPITAL_COMMUNITY): Payer: Medicare Other

## 2015-05-22 DIAGNOSIS — E860 Dehydration: Secondary | ICD-10-CM | POA: Diagnosis not present

## 2015-05-22 DIAGNOSIS — IMO0002 Reserved for concepts with insufficient information to code with codable children: Secondary | ICD-10-CM

## 2015-05-22 DIAGNOSIS — Z7951 Long term (current) use of inhaled steroids: Secondary | ICD-10-CM | POA: Diagnosis not present

## 2015-05-22 DIAGNOSIS — Z8619 Personal history of other infectious and parasitic diseases: Secondary | ICD-10-CM | POA: Diagnosis not present

## 2015-05-22 DIAGNOSIS — F329 Major depressive disorder, single episode, unspecified: Secondary | ICD-10-CM | POA: Diagnosis not present

## 2015-05-22 DIAGNOSIS — K219 Gastro-esophageal reflux disease without esophagitis: Secondary | ICD-10-CM | POA: Diagnosis not present

## 2015-05-22 DIAGNOSIS — Z87442 Personal history of urinary calculi: Secondary | ICD-10-CM | POA: Insufficient documentation

## 2015-05-22 DIAGNOSIS — F419 Anxiety disorder, unspecified: Secondary | ICD-10-CM | POA: Diagnosis not present

## 2015-05-22 DIAGNOSIS — I10 Essential (primary) hypertension: Secondary | ICD-10-CM | POA: Insufficient documentation

## 2015-05-22 DIAGNOSIS — M1990 Unspecified osteoarthritis, unspecified site: Secondary | ICD-10-CM | POA: Insufficient documentation

## 2015-05-22 DIAGNOSIS — G709 Myoneural disorder, unspecified: Secondary | ICD-10-CM | POA: Diagnosis not present

## 2015-05-22 DIAGNOSIS — R42 Dizziness and giddiness: Secondary | ICD-10-CM | POA: Diagnosis not present

## 2015-05-22 DIAGNOSIS — Z7901 Long term (current) use of anticoagulants: Secondary | ICD-10-CM | POA: Insufficient documentation

## 2015-05-22 DIAGNOSIS — Z79899 Other long term (current) drug therapy: Secondary | ICD-10-CM | POA: Insufficient documentation

## 2015-05-22 DIAGNOSIS — H8110 Benign paroxysmal vertigo, unspecified ear: Secondary | ICD-10-CM | POA: Insufficient documentation

## 2015-05-22 LAB — URINALYSIS, ROUTINE W REFLEX MICROSCOPIC
BILIRUBIN URINE: NEGATIVE
Glucose, UA: NEGATIVE mg/dL
KETONES UR: NEGATIVE mg/dL
NITRITE: NEGATIVE
Protein, ur: NEGATIVE mg/dL
Specific Gravity, Urine: 1.016 (ref 1.005–1.030)
Urobilinogen, UA: 0.2 mg/dL (ref 0.0–1.0)
pH: 5.5 (ref 5.0–8.0)

## 2015-05-22 LAB — CBC
HCT: 46.5 % (ref 39.0–52.0)
Hemoglobin: 16 g/dL (ref 13.0–17.0)
MCH: 31.6 pg (ref 26.0–34.0)
MCHC: 34.4 g/dL (ref 30.0–36.0)
MCV: 91.9 fL (ref 78.0–100.0)
Platelets: 206 10*3/uL (ref 150–400)
RBC: 5.06 MIL/uL (ref 4.22–5.81)
RDW: 13.3 % (ref 11.5–15.5)
WBC: 9.3 10*3/uL (ref 4.0–10.5)

## 2015-05-22 LAB — BASIC METABOLIC PANEL
ANION GAP: 11 (ref 5–15)
BUN: 12 mg/dL (ref 6–20)
CO2: 26 mmol/L (ref 22–32)
Calcium: 9.1 mg/dL (ref 8.9–10.3)
Chloride: 101 mmol/L (ref 101–111)
Creatinine, Ser: 2.01 mg/dL — ABNORMAL HIGH (ref 0.61–1.24)
GFR calc Af Amer: 36 mL/min — ABNORMAL LOW (ref >60)
GFR, EST NON AFRICAN AMERICAN: 31 mL/min — AB (ref >60)
Glucose, Bld: 130 mg/dL — ABNORMAL HIGH (ref 65–99)
Potassium: 3.2 mmol/L — ABNORMAL LOW (ref 3.5–5.1)
Sodium: 138 mmol/L (ref 135–145)

## 2015-05-22 LAB — URINE MICROSCOPIC-ADD ON

## 2015-05-22 LAB — CBG MONITORING, ED: GLUCOSE-CAPILLARY: 110 mg/dL — AB (ref 65–99)

## 2015-05-22 MED ORDER — MECLIZINE HCL 25 MG PO TABS
25.0000 mg | ORAL_TABLET | Freq: Once | ORAL | Status: AC
  Administered 2015-05-22: 25 mg via ORAL
  Filled 2015-05-22: qty 1

## 2015-05-22 MED ORDER — SODIUM CHLORIDE 0.9 % IV BOLUS (SEPSIS)
1000.0000 mL | Freq: Once | INTRAVENOUS | Status: AC
  Administered 2015-05-22: 1000 mL via INTRAVENOUS

## 2015-05-22 NOTE — ED Notes (Signed)
Patient given sandwich.  

## 2015-05-22 NOTE — ED Provider Notes (Signed)
CSN: [id number]     Arrival date & time 05/22/15  1948 History   First MD Initiated Contact with Patient 05/22/15 2031     Chief Complaint  Patient presents with  . Loss of Consciousness  . Dizziness     (Consider location/radiation/quality/duration/timing/severity/associated sxs/prior Treatment) HPI Comments: Patient is a 74 year old male with a past medical history of hypertension, GERD, hyperlipidemia, and seasonal allergies who presents with dizziness that started today about 2 hours prior to arrival. 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 Patient reports when he sat down, he may have lost consciousness while sitting in the chair. He did not fall and denies any head trauma. The dizziness is worse with head movement. Sitting still makes the symptoms better. Patient reports having mild similar symptoms over the past 3 weeks but today it became worse. No other associated symptoms.    Past Medical History  Diagnosis Date  . 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 Sleep apnea   . 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 Allergy     takes Zyrtec daily  . Neuromuscular disorder   . Arthritis   . Back pain   . Dyspnea on exertion   . Hyperlipidemia   . Depression     takes Wellbutrin daily  . GERD (gastroesophageal reflux disease)     takes Nexium and Omeprazole daily  . Anxiety     takes Ativan daily as needed  . Hypertension   . History of kidney stones   . Umbilical hernia    Past Surgical History  Procedure Laterality Date  . Epidural steroid injections      every 6 months   Dr. Arlice Colt  . Hernia repair  2003  . Back surgery  2003    L4, L5  . Endovenous ablation saphenous vein w/ laser Right 06-01-2014    EVLA right greater saphenous vein by Curt Jews MD   . Back surgery    . Vascular surgery    . Hernia repair      hernia inguinal  . 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 Thyroid disease Mother   . Heart disease Father   . Diabetes Maternal Grandfather    History  Substance Use Topics  . Smoking status: Never Smoker   . Smokeless tobacco: Never Used  . Alcohol Use: No    Review of Systems  Constitutional: Negative for fever, chills and fatigue.  HENT: Negative for trouble swallowing.   Eyes: Negative for visual disturbance.  Respiratory: Negative for shortness of breath.   Cardiovascular: Negative for chest pain and palpitations.  Gastrointestinal: Negative for nausea, vomiting, abdominal pain and diarrhea.  Genitourinary: Negative for dysuria and difficulty urinating.  Musculoskeletal: Negative for arthralgias and neck pain.  Skin: Negative for color change.  Neurological: Positive for dizziness. Negative for weakness.  Psychiatric/Behavioral: Negative for dysphoric mood.      Allergies  Review of patient's allergies indicates no known allergies.  Home Medications   Prior to Admission medications  Medication Sig Start Date End Date Taking? Authorizing Provider  augmented betamethasone dipropionate (DIPROLENE AF) 0.05 % cream Apply topically 2 (two) times daily. 12/28/14   [name], MD  buPROPion (WELLBUTRIN XL) 300 MG 24 hr tablet TAKE 1 TABLET BY MOUTH EVERY MORNING 12/29/14   [name], MD  calcium carbonate 200 MG capsule Take 200 mg by mouth daily.     Historical Provider, MD  cetirizine (ZYRTEC) 10 MG tablet Take 10 mg by mouth daily as needed for allergies.    Historical Provider, MD  docusate sodium (COLACE) 100 MG capsule Take 1 capsule (100 mg total) by mouth 2 (two) times daily. 03/23/15   Meredith Pel, MD   doxazosin (CARDURA) 8 MG tablet TAKE 1 TABLET BY MOUTH ONCE A DAY 01/29/15   [name], MD  esomeprazole (NEXIUM) 20 MG packet Take 20 mg by mouth daily before breakfast.    Historical Provider, MD  flurazepam (DALMANE) 15 MG capsule Take 1 capsule (15 mg total) by mouth at bedtime as needed for sleep. 03/26/15   Hennie Duos, MD  fluticasone (FLONASE) 50 MCG/ACT nasal spray Place 2 sprays into the nose daily. 07/28/12   Lyndal Pulley, DO  furosemide (LASIX) 40 MG tablet TAKE 1 TABLET BY MOUTH DAILY. 05/01/15   [name], MD  gabapentin (NEURONTIN) 300 MG capsule Take 300 mg by mouth at bedtime. Take 2 capsules = 600 mg Q HS    Historical Provider, MD  HYDROcodone-acetaminophen (NORCO/VICODIN) 5-325 MG per tablet 1 tablet every 6-8 hours as needed for back pain 05/11/15 06/06/15  [name], MD  LORazepam (ATIVAN) 0.5 MG tablet Take one tablet by mouth every 12 hours for anxiety 04/03/15   Lauree Chandler, NP  methocarbamol (ROBAXIN) 500 MG tablet Take 1 tablet (500 mg total) by mouth every 6 (six) hours as needed for muscle spasms. 05/09/15   [name], MD  Multiple Vitamin (MULTIVITAMIN) tablet Take 1 tablet by mouth daily.    Historical Provider, MD  omeprazole (PRILOSEC) 40 MG capsule Take 40 mg by mouth daily as needed (reflux).     Historical Provider, MD  ondansetron (ZOFRAN) 8 MG tablet Take 1 tablet (8 mg total) by mouth every 8 (eight) hours as needed for nausea or vomiting. 04/16/15   [name], MD  polyethylene glycol powder (GLYCOLAX/MIRALAX) powder Take 17 g by mouth 2 (two) times daily as needed. 04/16/15   [name], MD  potassium chloride SA (K-DUR,KLOR-CON) 20 MEQ tablet Take 2 tablets (40 mEq total) by mouth daily. 12/28/14   [name], MD  rivaroxaban (XARELTO) 10 MG TABS tablet Take 1 tablet (10 mg total) by mouth daily with breakfast. 03/23/15   Meredith Pel, MD  sildenafil (REVATIO) 20 MG tablet Take 5 tablets daily as needed for  erectile dysfunction. 04/13/14   [name], MD  testosterone cypionate (DEPOTESTOSTERONE CYPIONATE) 200 MG/ML injection Inject 0.5 mLs (100 mg total) into the muscle every 7 (seven) days. 04/27/15   [name], MD   BP 129/79 mmHg  Pulse 98  Temp(Src) 98.2 F (36.8 C) (Oral)  Resp 16  Ht 5\' 10"  (1.778 m)  Wt 231 lb (104.781 kg)  BMI 33.15 kg/m2  SpO2 95% Physical Exam  Constitutional: He is oriented to person, place, and time. He appears well-developed and well-nourished. No distress.  HENT:  Head: Normocephalic and atraumatic.  Mouth/Throat: Oropharynx is clear and moist. No oropharyngeal  exudate.  Eyes: Conjunctivae and EOM are normal. Pupils are equal, round, and reactive to light.  Neck: Normal range of motion.  Cardiovascular: Normal rate and regular rhythm.  Exam reveals no gallop and no friction rub.   No murmur heard. Pulmonary/Chest: Effort normal and breath sounds normal. He has no wheezes. He has no rales. He exhibits no tenderness.  Abdominal: Soft. He exhibits no distension. There is no tenderness. There is no rebound.  Musculoskeletal: Normal range of motion.  Neurological: He is alert and oriented to person, place, and time. No cranial nerve deficit. Coordination normal.  Extremity strength and sensation equal and intact bilaterally. Speech is goal-oriented. Moves limbs without ataxia.   Skin: Skin is warm and dry.  Psychiatric: He has a normal mood and affect. His behavior is normal.  Nursing note and vitals reviewed.   ED Course  Procedures (including critical care time) Labs Review Labs Reviewed  BASIC METABOLIC PANEL - Abnormal; Notable for the following:    Potassium 3.2 (*)    Glucose, Bld 130 (*)    Creatinine, Ser 2.01 (*)    GFR calc non Af Amer 31 (*)    GFR calc Af Amer 36 (*)    All other components within normal limits  URINALYSIS, ROUTINE W REFLEX MICROSCOPIC (NOT AT [facility]) - Abnormal; Notable for the following:    Hgb urine dipstick  SMALL (*)    Leukocytes, UA SMALL (*)    All other components within normal limits  CBG MONITORING, ED - Abnormal; Notable for the following:    Glucose-Capillary 110 (*)    All other components within normal limits  CBC  URINE MICROSCOPIC-ADD ON    Imaging Review Ct Head Wo Contrast  05/22/2015   CLINICAL DATA:  Dizziness  EXAM: CT HEAD WITHOUT CONTRAST  TECHNIQUE: Contiguous axial images were obtained from the base of the skull through the vertex without intravenous contrast.  COMPARISON:  04/08/2008  FINDINGS: No skull fracture is noted. Paranasal sinuses and mastoid air cells are unremarkable. No intracranial hemorrhage, mass effect or midline shift. Mild cerebral atrophy. Ventricular size is stable from prior exam. No mass lesion is noted on this unenhanced scan. No acute cortical infarction.  IMPRESSION: No acute intracranial abnormality.  No significant change.   Electronically Signed   By: Lahoma Crocker M.D.   On: 05/22/2015 21:11     EKG Interpretation None      MDM   Final diagnoses:  Dehydration  Positional vertigo, unspecified laterality    8:37 PM Labs and CT head pending. Vitals stable and patient afebrile.   10:14 PM Patient's head CT shows no acute changes. Labs show mildly elevated creatinine at 2.01, suggesting dehydration. No other acute changes. Patient will have fluids and meclizine for symptoms. Patient will likely be discharged with meclizine and instructions to stay hydrated. Patient's symptoms likely BPPV and less likely peripheral circulation stroke. Dr. Vanita Panda saw the patient and agreeable to plan.   Alvina Chou, PA-C 05/23/15 0348  Carmin Muskrat, MD 05/23/15 2050

## 2015-05-22 NOTE — ED Notes (Signed)
Patient here complaint of possible loss of consciousness today. States that he was at work and realized later that he had lost some time. Has felt dizzy for 3-4 weeks; today worsened. Any movement drastically exacerbates dizziness. Recent hx of knee replacement with complication of ventilator associated infection. Explains that he hasn't felt right since then.

## 2015-05-23 DIAGNOSIS — E860 Dehydration: Secondary | ICD-10-CM | POA: Diagnosis not present

## 2015-05-23 MED ORDER — MECLIZINE HCL 50 MG PO TABS: 25.0000 mg | ORAL_TABLET | Freq: Three times a day (TID) | ORAL | Status: DC | PRN

## 2015-05-23 NOTE — ED Notes (Signed)
Pt able to dress self in room. Gait steady and even.

## 2015-05-23 NOTE — Discharge Instructions (Signed)
Take Meclizine as needed for dizziness. Refer to attached documents for more information. Follow up with your doctor for further evaluation.

## 2015-05-28 DIAGNOSIS — R1310 Dysphagia, unspecified: Secondary | ICD-10-CM | POA: Diagnosis not present

## 2015-05-28 DIAGNOSIS — K219 Gastro-esophageal reflux disease without esophagitis: Secondary | ICD-10-CM | POA: Diagnosis not present

## 2015-05-29 DIAGNOSIS — R1310 Dysphagia, unspecified: Secondary | ICD-10-CM | POA: Diagnosis not present

## 2015-05-29 DIAGNOSIS — K219 Gastro-esophageal reflux disease without esophagitis: Secondary | ICD-10-CM | POA: Diagnosis not present

## 2015-05-29 DIAGNOSIS — R110 Nausea: Secondary | ICD-10-CM | POA: Diagnosis not present

## 2015-05-31 ENCOUNTER — Ambulatory Visit (INDEPENDENT_AMBULATORY_CARE_PROVIDER_SITE_OTHER): Payer: Medicare Other | Admitting: *Deleted

## 2015-05-31 DIAGNOSIS — E291 Testicular hypofunction: Secondary | ICD-10-CM

## 2015-05-31 DIAGNOSIS — E349 Endocrine disorder, unspecified: Secondary | ICD-10-CM

## 2015-05-31 MED ORDER — TESTOSTERONE CYPIONATE 200 MG/ML IM SOLN
200.0000 mg | Freq: Once | INTRAMUSCULAR | Status: AC
Start: 2015-05-31 — End: 2015-05-31
  Administered 2015-05-31: 200 mg via INTRAMUSCULAR

## 2015-05-31 NOTE — Progress Notes (Signed)
   Subjective:    Patient ID: [name], male    DOB: 1941/01/24, 74 y.o.   MRN: [id number]  HPI Patient here today for testosterone injection.   Review of Systems     Objective:   Physical Exam        Assessment & Plan:

## 2015-06-08 ENCOUNTER — Other Ambulatory Visit: Payer: Self-pay | Admitting: Emergency Medicine

## 2015-06-11 ENCOUNTER — Ambulatory Visit (INDEPENDENT_AMBULATORY_CARE_PROVIDER_SITE_OTHER): Payer: Medicare Other

## 2015-06-11 ENCOUNTER — Telehealth: Payer: Self-pay | Admitting: Physician Assistant

## 2015-06-11 DIAGNOSIS — E291 Testicular hypofunction: Secondary | ICD-10-CM | POA: Diagnosis not present

## 2015-06-11 MED ORDER — SYRINGE (REUSABLE) 3 ML MISC: Status: DC

## 2015-06-11 MED ORDER — "NEEDLE (DISP) 21G X 1-1/2"" MISC": Status: DC

## 2015-06-11 NOTE — Progress Notes (Signed)
   Subjective:    Patient ID: [name], male    DOB: 29-Jul-1941, 74 y.o.   MRN: [id number]  HPI Pt came to have testosterone injection. Bottled was opened on 04/29/2016. Due to being more than 28 days since opening of bottle I did not give injection.   Pt did not have the supplies needed at home to self inject. I spoke to [employer], (PA-C) who sent in needles and syringes to pt's pharmacy.    Review of Systems     Objective:   Physical Exam        Assessment & Plan:

## 2015-06-11 NOTE — Telephone Encounter (Signed)
Spoke with patient and Geophysical data processor.  Per the pharmacy, they sent fax and electronic requests for refill of lorazepam to [place] on 04/30/2015. There is no request noted in our system.  EMR indicates a prescription for lorazepam 0.5 mg #60, RF x 5 written by Ms. Dewaine Oats, NP on 04/03/2015. This was while the patient was at [facility] recovering from surgery. There is no record of that prescription on the [place] Controlled Substance Database.  The patient requests BOTH flurazepam (for HS, PRN) and lorazepam 1 mg BID PRN.

## 2015-06-12 ENCOUNTER — Other Ambulatory Visit: Payer: Self-pay | Admitting: Emergency Medicine

## 2015-06-12 MED ORDER — FLURAZEPAM HCL 15 MG PO CAPS: 15.0000 mg | ORAL_CAPSULE | Freq: Every evening | ORAL | Status: DC | PRN

## 2015-06-12 MED ORDER — LORAZEPAM 0.5 MG PO TABS
0.5000 mg | ORAL_TABLET | Freq: Two times a day (BID) | ORAL | Status: DC | PRN
Start: 2015-06-12 — End: 2015-12-21

## 2015-06-12 NOTE — Telephone Encounter (Signed)
Prescriptions will be faxed to his pharmacy.

## 2015-06-18 DIAGNOSIS — M179 Osteoarthritis of knee, unspecified: Secondary | ICD-10-CM | POA: Diagnosis not present

## 2015-06-20 ENCOUNTER — Telehealth: Payer: Self-pay

## 2015-06-20 NOTE — Telephone Encounter (Signed)
Pt also is needing a refill on his pain medication to be sent to [facility]

## 2015-06-21 NOTE — Telephone Encounter (Signed)
He is on hydrocodone. He can have it refilled. 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

## 2015-06-21 NOTE — Telephone Encounter (Signed)
There is no information in the database for this patient. What pain medication does he want? I don't see any in his medications list. Please clarify. If it is hydrocodone, I am willing to refill for 1 week only. But he needs to come in to see Dr. Everlene Farrier at the walk in clinic. There is increased risk of AMS, delirium, sedation in his age group. Please clarify what pain med he wants and I can help with this.

## 2015-06-21 NOTE — Telephone Encounter (Signed)
[name] spoke with patient and he stated he can wait for Dr. Everlene Farrier to write when he returns to office.

## 2015-06-21 NOTE — Telephone Encounter (Signed)
Patient is calling to follow up on refill. He states he would like his prescription today since he plans on going to [employer]. He would states that Ebony Hail should be able to help him and he would like for her to give him a call.

## 2015-06-21 NOTE — Telephone Encounter (Signed)
Dr. Everlene Farrier is out for a week. Please advise.

## 2015-06-22 MED ORDER — HYDROCODONE-ACETAMINOPHEN 5-325 MG PO TABS: 1.0000 | ORAL_TABLET | Freq: Four times a day (QID) | ORAL | Status: DC | PRN

## 2015-06-22 NOTE — Telephone Encounter (Signed)
Left message letting pt know Rx is ready to pick up.

## 2015-06-22 NOTE — Telephone Encounter (Signed)
Can we write Rx. Thanks.

## 2015-06-22 NOTE — Telephone Encounter (Signed)
Script printed. Please notify patient it is ready for pick up. Thank you!

## 2015-06-25 DIAGNOSIS — M179 Osteoarthritis of knee, unspecified: Secondary | ICD-10-CM | POA: Diagnosis not present

## 2015-07-02 DIAGNOSIS — M179 Osteoarthritis of knee, unspecified: Secondary | ICD-10-CM | POA: Diagnosis not present

## 2015-07-10 ENCOUNTER — Other Ambulatory Visit: Payer: Self-pay | Admitting: Emergency Medicine

## 2015-07-10 ENCOUNTER — Telehealth: Payer: Self-pay

## 2015-07-10 DIAGNOSIS — M5441 Lumbago with sciatica, right side: Secondary | ICD-10-CM

## 2015-07-10 DIAGNOSIS — M5442 Lumbago with sciatica, left side: Principal | ICD-10-CM

## 2015-07-10 MED ORDER — HYDROCODONE-ACETAMINOPHEN 5-325 MG PO TABS: 1.0000 | ORAL_TABLET | Freq: Four times a day (QID) | ORAL | Status: DC | PRN

## 2015-07-10 MED ORDER — GABAPENTIN 300 MG PO CAPS: ORAL_CAPSULE | ORAL | Status: DC

## 2015-07-10 NOTE — Telephone Encounter (Signed)
Needs a refill on is hydrocodone and gabapentin

## 2015-07-13 ENCOUNTER — Telehealth: Payer: Self-pay | Admitting: Emergency Medicine

## 2015-07-13 NOTE — Telephone Encounter (Signed)
Patient received his medication at 104. Patient is requesting a 90 day prescription instead of a 30 day of the Hydrocondone-Aceptaminophen 5-325 MG

## 2015-07-16 NOTE — Telephone Encounter (Signed)
Dr Daub? Please advise. 

## 2015-07-16 NOTE — Telephone Encounter (Signed)
I do not write 90 day prescriptions for hydrocodone anymore. It has to be a 30 day prescription. They are becoming very strict regarding pain medication.

## 2015-07-18 NOTE — Telephone Encounter (Signed)
Left detailed voicemail letting pt know.

## 2015-08-06 ENCOUNTER — Other Ambulatory Visit: Payer: Self-pay | Admitting: Emergency Medicine

## 2015-08-06 ENCOUNTER — Other Ambulatory Visit: Payer: Self-pay | Admitting: Physician Assistant

## 2015-08-06 ENCOUNTER — Telehealth: Payer: Self-pay

## 2015-08-06 DIAGNOSIS — M5441 Lumbago with sciatica, right side: Secondary | ICD-10-CM

## 2015-08-06 DIAGNOSIS — M5442 Lumbago with sciatica, left side: Principal | ICD-10-CM

## 2015-08-06 MED ORDER — HYDROCODONE-ACETAMINOPHEN 5-325 MG PO TABS: 1.0000 | ORAL_TABLET | Freq: Four times a day (QID) | ORAL | Status: DC | PRN

## 2015-08-06 NOTE — Telephone Encounter (Signed)
Pt is needing a refill on his hydrocodone.    

## 2015-08-07 NOTE — Telephone Encounter (Signed)
Notified pt on VM ready. 

## 2015-08-14 ENCOUNTER — Encounter: Payer: Self-pay | Admitting: Emergency Medicine

## 2015-08-20 ENCOUNTER — Telehealth: Payer: Self-pay

## 2015-08-20 DIAGNOSIS — E291 Testicular hypofunction: Secondary | ICD-10-CM

## 2015-08-20 NOTE — Telephone Encounter (Signed)
Patient called in stating that he needs his testosterone cypionate (DEPOTESTOSTERONE CYPIONATE) 200 MG/ML injection refilled but he stated that Dr. Laney Pastor and Fanny Skates know that he is suppose to have a lower dose and it wasn't prescribed that way the last time and he needs it to be this time. He uses the [facility] [address], [place].

## 2015-08-21 MED ORDER — TESTOSTERONE CYPIONATE 200 MG/ML IM SOLN: 100.0000 mg | INTRAMUSCULAR | Status: DC

## 2015-08-21 NOTE — Telephone Encounter (Signed)
Meds ordered this encounter  Medications  . testosterone cypionate (DEPOTESTOSTERONE CYPIONATE) 200 MG/ML injection    Sig: Inject 0.5 mLs (100 mg total) into the muscle every 7 (seven) days.    Dispense:  10 mL    Refill:  5   This is way its listed

## 2015-08-22 NOTE — Telephone Encounter (Signed)
Called in as Rxd.

## 2015-08-27 ENCOUNTER — Ambulatory Visit (INDEPENDENT_AMBULATORY_CARE_PROVIDER_SITE_OTHER): Payer: Medicare Other | Admitting: Internal Medicine

## 2015-08-27 VITALS — BP 138/88 | HR 97 | Temp 97.9°F | Resp 20 | Ht 68.0 in | Wt 247.4 lb

## 2015-08-27 DIAGNOSIS — R739 Hyperglycemia, unspecified: Secondary | ICD-10-CM

## 2015-08-27 DIAGNOSIS — L03115 Cellulitis of right lower limb: Secondary | ICD-10-CM | POA: Diagnosis not present

## 2015-08-27 DIAGNOSIS — E291 Testicular hypofunction: Secondary | ICD-10-CM | POA: Diagnosis not present

## 2015-08-27 DIAGNOSIS — E876 Hypokalemia: Secondary | ICD-10-CM | POA: Diagnosis not present

## 2015-08-27 DIAGNOSIS — M546 Pain in thoracic spine: Secondary | ICD-10-CM | POA: Diagnosis not present

## 2015-08-27 DIAGNOSIS — L989 Disorder of the skin and subcutaneous tissue, unspecified: Secondary | ICD-10-CM | POA: Diagnosis not present

## 2015-08-27 DIAGNOSIS — E349 Endocrine disorder, unspecified: Secondary | ICD-10-CM

## 2015-08-27 DIAGNOSIS — I872 Venous insufficiency (chronic) (peripheral): Secondary | ICD-10-CM | POA: Diagnosis not present

## 2015-08-27 DIAGNOSIS — M5416 Radiculopathy, lumbar region: Secondary | ICD-10-CM

## 2015-08-27 DIAGNOSIS — R7309 Other abnormal glucose: Secondary | ICD-10-CM | POA: Diagnosis not present

## 2015-08-27 MED ORDER — TESTOSTERONE CYPIONATE 200 MG/ML IM SOLN
100.0000 mg | Freq: Once | INTRAMUSCULAR | Status: AC
  Administered 2015-08-27: 100 mg via INTRAMUSCULAR

## 2015-08-27 MED ORDER — HYDROCODONE-ACETAMINOPHEN 5-325 MG PO TABS: ORAL_TABLET | ORAL | Status: AC

## 2015-08-27 MED ORDER — AMOXICILLIN-POT CLAVULANATE 875-125 MG PO TABS: 1.0000 | ORAL_TABLET | Freq: Two times a day (BID) | ORAL | Status: DC

## 2015-08-27 MED ORDER — CEFTRIAXONE SODIUM 1 G IJ SOLR
1.0000 g | Freq: Once | INTRAMUSCULAR | Status: AC
  Administered 2015-08-27: 1 g via INTRAMUSCULAR

## 2015-08-27 NOTE — Progress Notes (Addendum)
Subjective:  This chart was scribed for [name] Lin, MD by [name], ED Scribe. This patient was seen in room 11 and the patient's care was started at 8:43 PM.  Patient ID: [name], male    DOB: February 04, 1941, 74 y.o.   MRN: [id number]  HPI  Chief Complaint  Patient presents with  . Leg Swelling    C/O right leg swelling  . Injections    needs testosterone inj  . Flu Vaccine   HPI Comments: [name] is a 74 y.o. male who presents to the [facility] complaining of worsening right leg swelling.  Pt scraped his right lower leg on a pallet about 1 week ago. Since then, he's had increase swelling, redness and pain to right lower leg and foot. He has been wearing his compression hose as well as elevated leg during the night. He's also tried topical zinc. Past history of peripheral venous insufficiency status post surgery by vascular. Has been doing well until now. History of intermittent peripheral edema controlled with Lasix plus potassium. Recent potassium level was low. This was just after surgery and hospitalization for knee.  Pt has hx knee surgery and chronic back pain. Has been taking hydrocodone, last prescription was written 9/26 by Dr. Everlene Farrier for 30 days, but only for 30 tablets and his usual prescription is for 90 tablets. There was confusion and his phone call for refill request thinking he was asking for a 90 day supply. He was started and increased on hydrocodone over year ago to his current dose of 2-3 tablets a day which enables him to work through his back pains.[name] He is almost out of medication and would like a refill for 90 tablets..  Pt no longer finds as much relief with nexium for gerd.Not ready to change anything. Pt would also like a flu shot and testosterone injection.   Past Medical History  Diagnosis Date  . 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 Sleep apnea   . 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  Allergy     takes Zyrtec daily  . Neuromuscular disorder ([place])   . Arthritis   . Back pain   . Dyspnea on exertion   . Hyperlipidemia   . Depression     takes Wellbutrin daily  . GERD (gastroesophageal reflux disease)     takes Nexium and Omeprazole daily  . Anxiety     takes Ativan daily as needed  . Hypertension   . History of kidney stones   . Umbilical hernia    No Known Allergies Prior to Admission medications   Medication Sig Start Date End Date Taking? Authorizing Provider  buPROPion (WELLBUTRIN XL) 300 MG 24 hr tablet Take 1 tablet (300 mg total) by mouth daily. PATIENT NEEDS OFFICE VISIT FOR ADDITIONAL REFILLS 08/07/15  Yes [name], MD  calcium carbonate 200 MG capsule Take 200 mg by mouth daily.    Yes Historical Provider, MD  cetirizine (ZYRTEC) 10 MG tablet Take 10 mg by mouth daily.    Yes Historical Provider, MD  docusate sodium (COLACE) 100 MG capsule Take 1 capsule (100 mg total) by mouth 2 (two) times daily. 03/23/15  Yes Scott Marcene Duos, MD  doxazosin (CARDURA) 8 MG tablet Take 1 tablet (8 mg total) by mouth daily. PATIENT NEEDS OFFICE VISIT FOR ADDITIONAL REFILLS -overdue for follow up 08/07/15  Yes [name], MD  esomeprazole (  NEXIUM) 20 MG packet Take 20 mg by mouth daily before breakfast.   Yes Historical Provider, MD  flurazepam (DALMANE) 15 MG capsule Take 1 capsule (15 mg total) by mouth at bedtime as needed for sleep. 06/12/15  Yes [name], MD  fluticasone (FLONASE) 50 MCG/ACT nasal spray Place 2 sprays into the nose daily. 07/28/12  Yes Lyndal Pulley, DO  furosemide (LASIX) 40 MG tablet TAKE 1 TABLET BY MOUTH DAILY. 05/01/15  Yes [name], MD  gabapentin (NEURONTIN) 300 MG capsule Take 1-2 capsules at bedtime 07/10/15  Yes [name], MD  glucosamine-chondroitin 500-400 MG tablet Take 1 tablet by mouth 2 (two) times daily.   Yes Historical Provider, MD  HYDROcodone-acetaminophen (NORCO) 5-325 MG per tablet Take 1 tablet by mouth every 6  (six) hours as needed. 08/06/15  Yes [name], MD  LORazepam (ATIVAN) 0.5 MG tablet Take 1 tablet (0.5 mg total) by mouth 2 (two) times daily as needed for anxiety. 06/12/15  Yes [name], MD  meclizine (ANTIVERT) 50 MG tablet Take 0.5 tablets (25 mg total) by mouth 3 (three) times daily as needed for dizziness. 05/23/15  Yes Kaitlyn Szekalski, PA-C  methocarbamol (ROBAXIN) 500 MG tablet Take 1 tablet (500 mg total) by mouth every 6 (six) hours as needed for muscle spasms. 05/09/15  Yes [name], MD  Multiple Vitamin (MULTIVITAMIN) tablet Take 1 tablet by mouth daily.   Yes Historical Provider, MD  NEEDLE, DISP, 21 G 21G X 1-1/2" MISC Use to inject depotestosterone. 06/11/15  Yes [name], PA-C  polyethylene glycol powder (GLYCOLAX/MIRALAX) powder Take 17 g by mouth 2 (two) times daily as needed. 04/16/15  Yes [name], MD  potassium chloride SA (K-DUR,KLOR-CON) 20 MEQ tablet TAKE 2 TABLETS BY MOUTH DAILY. 06/09/15  Yes [name], MD  sildenafil (REVATIO) 20 MG tablet Take 5 tablets daily as needed for erectile dysfunction. 04/13/14  Yes [name], MD  Syringe, Reusable, 3 ML MISC Use to inject depotestosterone. 06/11/15  Yes [name], PA-C  testosterone cypionate (DEPOTESTOSTERONE CYPIONATE) 200 MG/ML injection Inject 0.5 mLs (100 mg total) into the muscle every 7 (seven) days. 08/21/15  Yes [name], MD   Review of Systems  Cardiovascular: Positive for leg swelling.  Skin: Positive for color change and wound.   Objective:   Physical Exam  Constitutional: He is oriented to person, place, and time. He appears well-developed and well-nourished. No distress.  HENT:  Head: Normocephalic and atraumatic.  Eyes: Conjunctivae and EOM are normal.  Neck: Neck supple.  Cardiovascular: Normal rate.   Pulmonary/Chest: Effort normal.  Musculoskeletal: Normal range of motion. He exhibits edema.  Wound anteriorly over LE with 2 areas denuded skin and a large  surrounding area of cellulitis that is tender to palpation. No underlying abscess. Calf nontender. There is dependent edema around the ankle that is moderate but without tenderness. Peripheral pulses are intact. No edema of the left leg.  Neurological: He is alert and oriented to person, place, and time.  Skin: Skin is warm and dry.  Psychiatric: He has a normal mood and affect. His behavior is normal.  Nursing note and vitals reviewed.   Filed Vitals:   08/27/15 2028  BP: 138/88  Pulse: 97  Temp: 97.9 F (36.6 C)  TempSrc: Oral  Resp: 20  Height: 5\' 8"  (1.727 m)  Weight: 247 lb 6 oz (112.209 kg)  SpO2: 96%   Assessment & Plan:  Testosterone deficiency - Plan: Give injection/check levels  Left-sided thoracic back pain  Lumbar radiculopathy  - Plan: HYDROcodone-acetaminophen (NORCO/VICODIN) 5-325 MG tablet #90 as given since 6/15  Uses 1-2 doses a day to keep working--See Dr Perfecto Kingdom notes  Venous (peripheral) insufficiency with new trauma to shin and 2ndary Cellulitis of leg, right with prominent dependent edema  -Rocephin 1 g  -Augmentin 875 twice a day  -Leg elevation as much as possible  -Continue compression stockings  -Follow-up 48 hours if not doing extremely well  Hyperglycemia - Plan: Hemoglobin A1c  Hypokalemia thought secondary to use of Lasix with incomplete potassium supplementation. Now on 10 mEq a day plus when necessary Lasix for dependent edema  - Plan: Comprehensive metabolic panel  Lesion of skin of face - Plan: Ambulatory referral to Dermatology to r/o [facility]  F/u for routine care w/ Dr Everlene Farrier for HM  By signing my name below, I, [name], attest that this documentation has been prepared under the direction and in the presence of [name] Lin, MD.  Electronically Signed: [name], ED Scribe. 08/27/2015. 9:15 PM.  I have completed the patient encounter in its entirety as documented by the scribe, with editing by me where necessary. Robert P.  Laney Pastor, M.D.

## 2015-08-28 LAB — COMPREHENSIVE METABOLIC PANEL
ALBUMIN: 4 g/dL (ref 3.6–5.1)
ALT: 29 U/L (ref 9–46)
AST: 31 U/L (ref 10–35)
Alkaline Phosphatase: 98 U/L (ref 40–115)
BUN: 18 mg/dL (ref 7–25)
CALCIUM: 8.9 mg/dL (ref 8.6–10.3)
CO2: 27 mmol/L (ref 20–31)
Chloride: 99 mmol/L (ref 98–110)
Creat: 1.65 mg/dL — ABNORMAL HIGH (ref 0.70–1.18)
Glucose, Bld: 127 mg/dL — ABNORMAL HIGH (ref 65–99)
Potassium: 3.6 mmol/L (ref 3.5–5.3)
Sodium: 137 mmol/L (ref 135–146)
Total Bilirubin: 0.5 mg/dL (ref 0.2–1.2)
Total Protein: 7.2 g/dL (ref 6.1–8.1)

## 2015-08-28 LAB — CBC WITH DIFFERENTIAL/PLATELET
BASOS ABS: 0 10*3/uL (ref 0.0–0.1)
Basophils Relative: 0 % (ref 0–1)
EOS ABS: 0.2 10*3/uL (ref 0.0–0.7)
Eosinophils Relative: 3 % (ref 0–5)
HEMATOCRIT: 45.4 % (ref 39.0–52.0)
Hemoglobin: 15.7 g/dL (ref 13.0–17.0)
LYMPHS ABS: 1.3 10*3/uL (ref 0.7–4.0)
LYMPHS PCT: 16 % (ref 12–46)
MCH: 31.3 pg (ref 26.0–34.0)
MCHC: 34.6 g/dL (ref 30.0–36.0)
MCV: 90.4 fL (ref 78.0–100.0)
MPV: 10.5 fL (ref 8.6–12.4)
Monocytes Absolute: 0.6 10*3/uL (ref 0.1–1.0)
Monocytes Relative: 7 % (ref 3–12)
NEUTROS PCT: 74 % (ref 43–77)
Neutro Abs: 6 10*3/uL (ref 1.7–7.7)
PLATELETS: 188 10*3/uL (ref 150–400)
RBC: 5.02 MIL/uL (ref 4.22–5.81)
RDW: 13.7 % (ref 11.5–15.5)
WBC: 8.1 10*3/uL (ref 4.0–10.5)

## 2015-08-28 LAB — HEMOGLOBIN A1C
Hgb A1c MFr Bld: 5.7 % — ABNORMAL HIGH (ref <5.7)
MEAN PLASMA GLUCOSE: 117 mg/dL — AB (ref <117)

## 2015-08-29 LAB — TESTOSTERONE, FREE, TOTAL, SHBG
SEX HORMONE BINDING: 24 nmol/L (ref 22–77)
TESTOSTERONE-% FREE: 2.5 % (ref 1.6–2.9)
TESTOSTERONE: 531 ng/dL (ref 300–890)
Testosterone, Free: 133.1 pg/mL (ref 47.0–244.0)

## 2015-08-31 ENCOUNTER — Encounter: Payer: Self-pay | Admitting: Internal Medicine

## 2015-09-07 ENCOUNTER — Telehealth: Payer: Self-pay | Admitting: Internal Medicine

## 2015-09-07 DIAGNOSIS — I739 Peripheral vascular disease, unspecified: Secondary | ICD-10-CM

## 2015-09-07 NOTE — Telephone Encounter (Signed)
Pt called and wanted to find out about his lab results. He wanted to see how much of the potassium he needs to take and how his testosterone level was.  He stated that he has a few abx left and wanted to know if he should have a refill of this abx, since his leg is still red to the area and does not seem much better.    Dr. Laney Pastor please advise on his lab results.

## 2015-09-08 NOTE — Telephone Encounter (Signed)
Sent letter with results on 10/21 " "Below are the results from your recent visit: Everything looks good. Testosterone well replaced. Hemoglobin A1c does not establish diagnosis of diabetes at this point.  Creatinine continues to be slightly elevated but there are no changes you have to make at this point other than weight loss which would be helpful."  If leg not responding to antibiotics then we need to have the vascular surgeons seen him to see if lack of blood flow is the issue---I'll set up the referral

## 2015-09-12 NOTE — Telephone Encounter (Signed)
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

## 2015-09-12 NOTE — Telephone Encounter (Signed)
Patient would like to know how much potassium he should be taking. He states he is out of the antibiotic, there is some redness in the infection but it is much better than it was and swelling has significantly decreased. He would also like his Meloxicam refilled. He has received his lab results. He will becoming in today for his testosterone shot.

## 2015-09-13 ENCOUNTER — Ambulatory Visit (INDEPENDENT_AMBULATORY_CARE_PROVIDER_SITE_OTHER): Payer: Medicare Other | Admitting: *Deleted

## 2015-09-13 DIAGNOSIS — E291 Testicular hypofunction: Secondary | ICD-10-CM

## 2015-09-13 DIAGNOSIS — Z23 Encounter for immunization: Secondary | ICD-10-CM

## 2015-09-13 MED ORDER — TESTOSTERONE CYPIONATE 200 MG/ML IM SOLN
100.0000 mg | Freq: Once | INTRAMUSCULAR | Status: AC
  Administered 2015-09-13: 100 mg via INTRAMUSCULAR

## 2015-09-13 NOTE — Telephone Encounter (Signed)
Left message for pt to call back., 

## 2015-09-13 NOTE — Progress Notes (Signed)
Patient here today for testosterone injection and flu vaccine.

## 2015-09-13 NOTE — Telephone Encounter (Signed)
[name] came in to get his testosterone injections.  He ask if a prescription for Meloxicam had been sent to his pharmacy.  I advised him that per [name] note that he should not be taking Meloxicam due to decreased kidney function. He states that him and [name] discussed this the last time he saw him and told him that he could take the meloxicam because his kidney functions levels really didn't change that much while he was taking the Meloxicam in the past. He is also wanting to know if his prescription for ampicillin needs to be extended a little longer.  He states his leg looks much better but there are still a few red spots left.  I advised I would send another note to [name] for his recommendations.

## 2015-09-16 IMAGING — CT CT HEAD W/O CM
1 series · 16 of 30 positions shown, 20 images · non-contrast
Comparison: 04/08/2008

CLINICAL DATA: Dizziness

EXAM:
CT HEAD WITHOUT CONTRAST
TECHNIQUE: Contiguous axial images were obtained from the base of the skull
through the vertex without intravenous contrast.

[Series 2: head 5.0 h30s · axial · 0.46mm/px · z∈[-183,-33]mm · 16 of 34 slices shown, 20 images]
[im 2/34  brain]
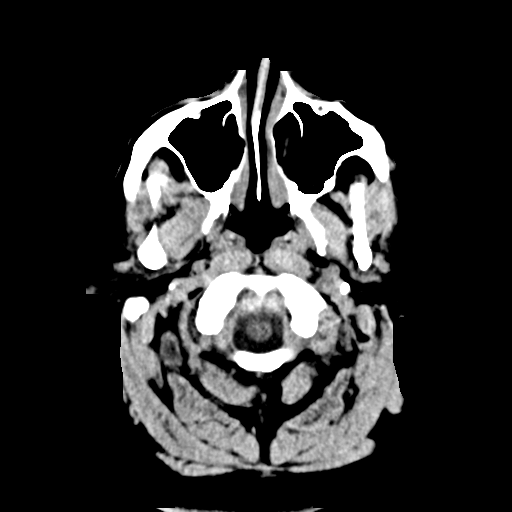
[im 2/34  bone]
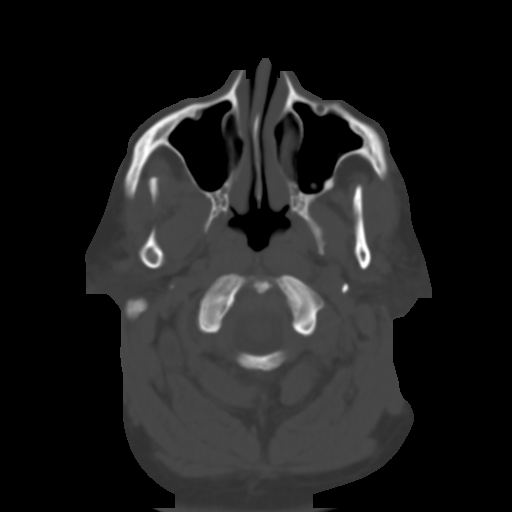
[im 4/34  brain]
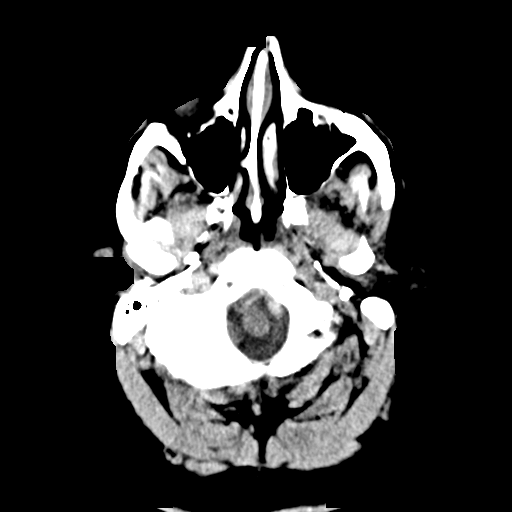
[im 6/34  brain]
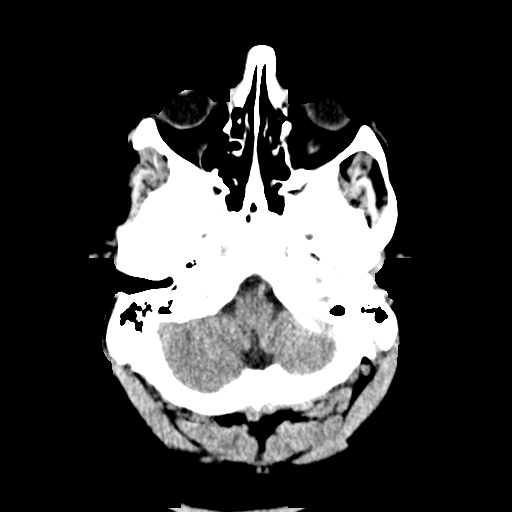
[im 8/34  brain]
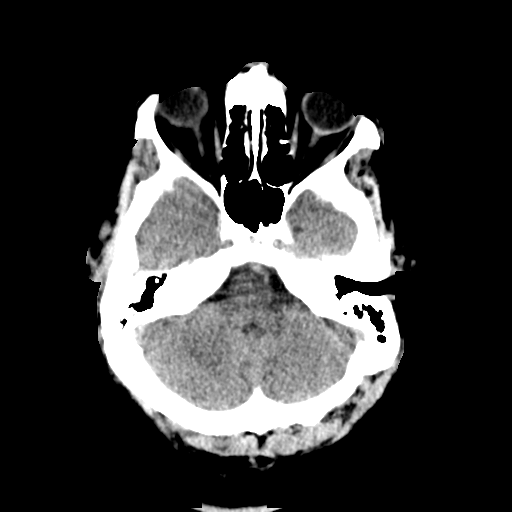
[im 10/34  brain]
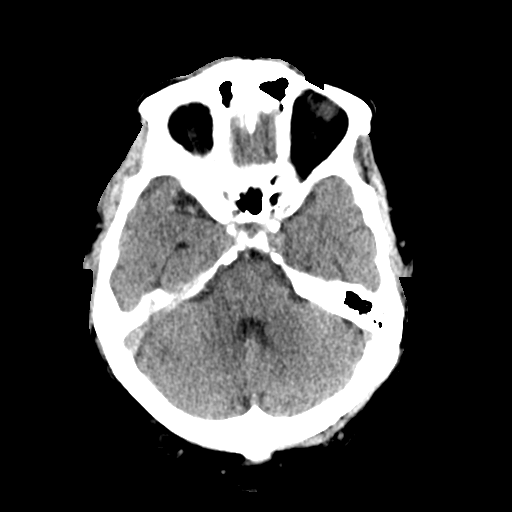
[im 10/34  bone]
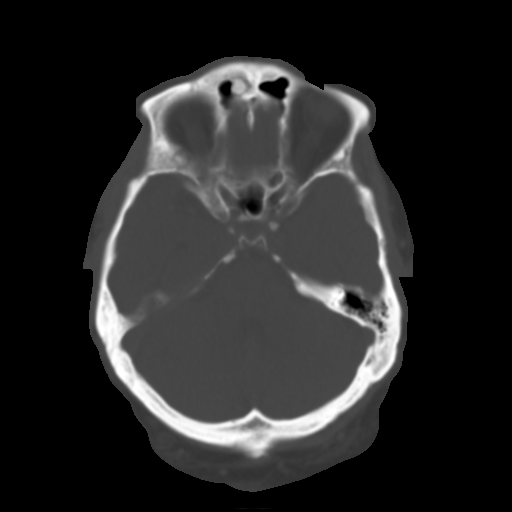
[im 12/34  brain]
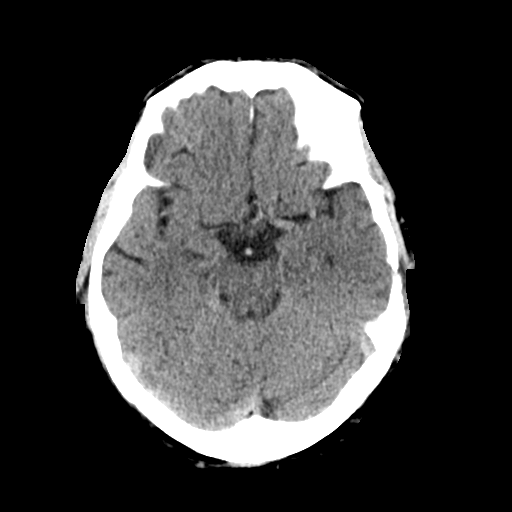
[im 14/34  brain]
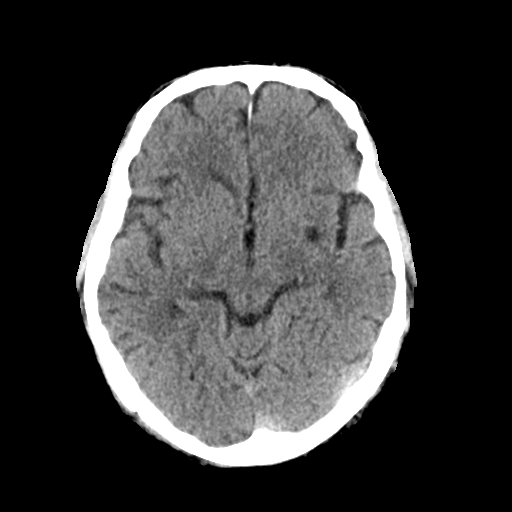
[im 16/34  brain]
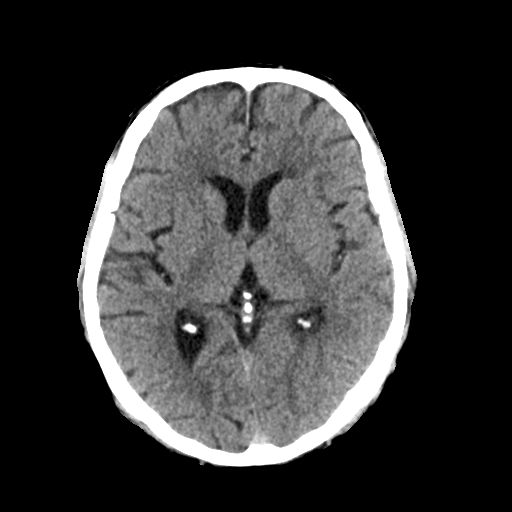
[im 18/34  brain]
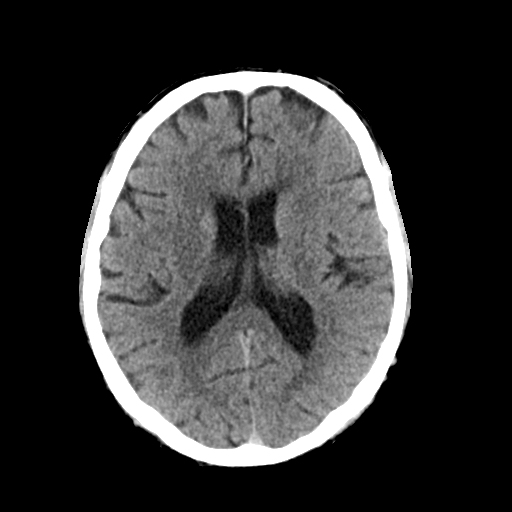
[im 18/34  bone]
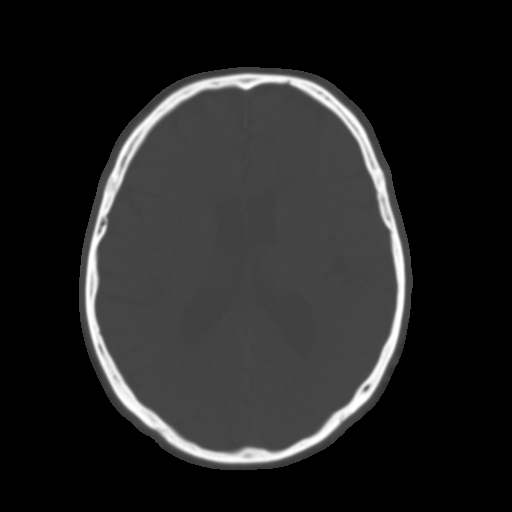
[im 20/34  brain]
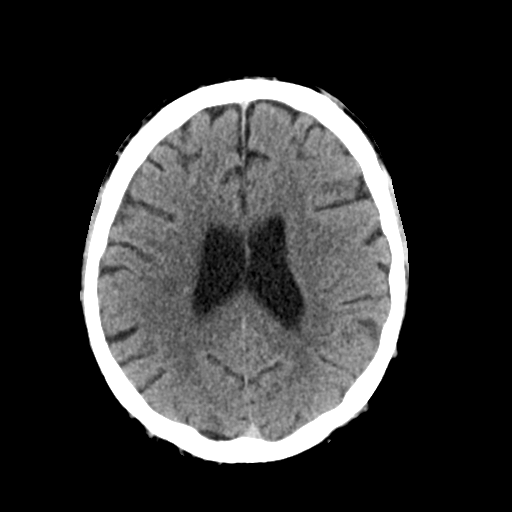
[im 22/34  brain]
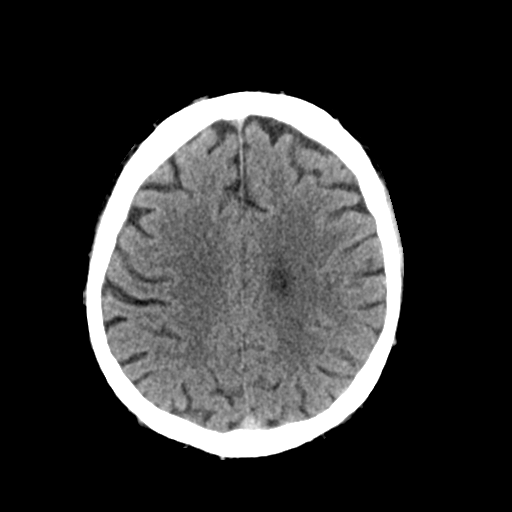
[im 24/34  brain]
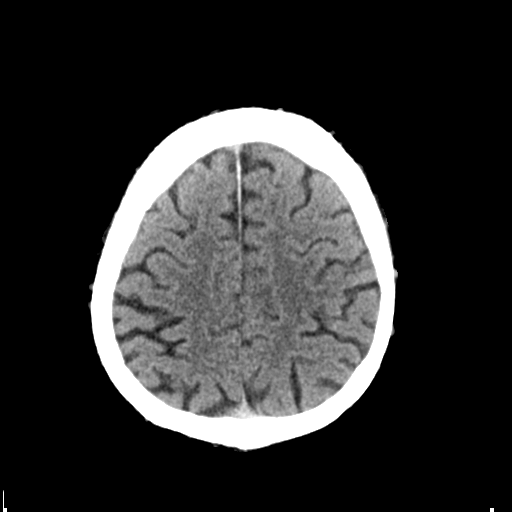
[im 26/34  brain]
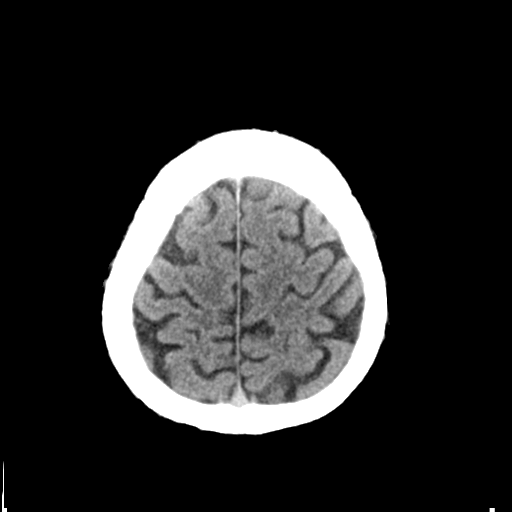
[im 26/34  bone]
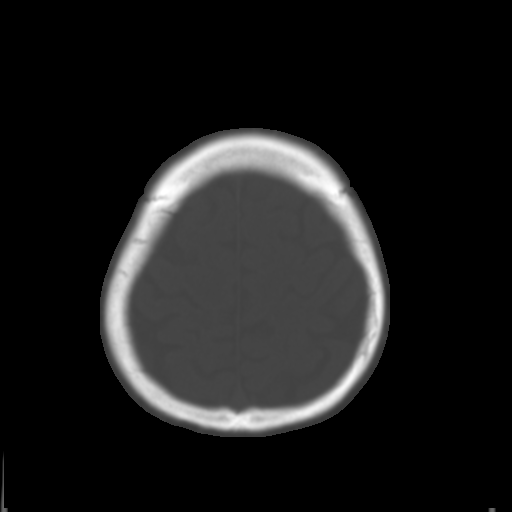
[im 28/34  brain]
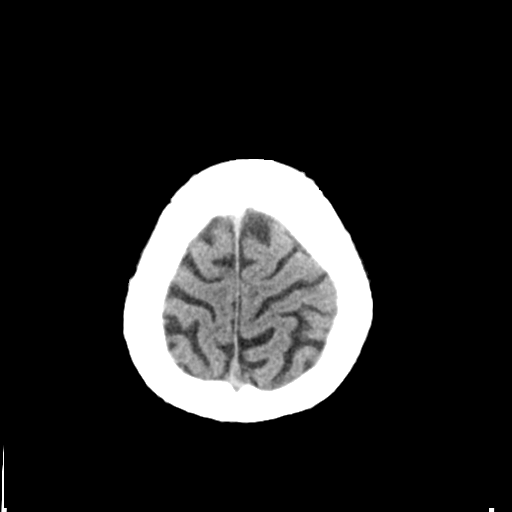
[im 30/34  brain]
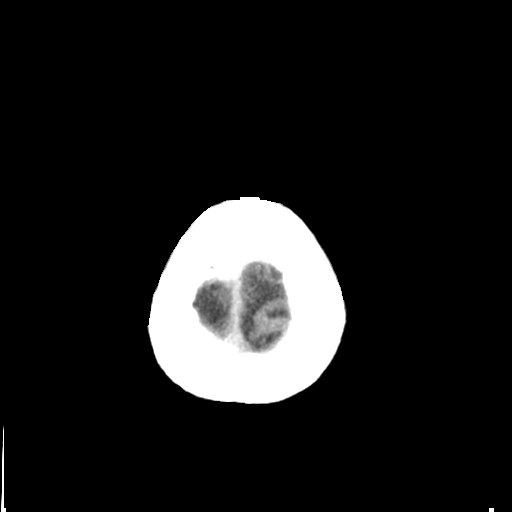
[im 32/34  brain]
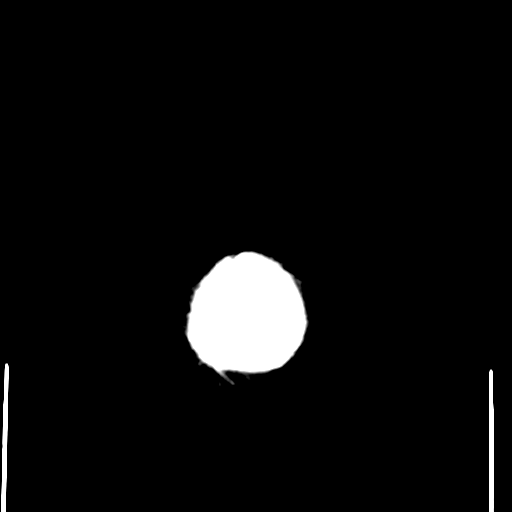

[16 of 30 positions shown; findings below may reference images not displayed]

FINDINGS: No skull fracture is noted. Paranasal sinuses and mastoid air cells
are unremarkable. No intracranial hemorrhage, mass effect or midline
shift. Mild cerebral atrophy. Ventricular size is stable from prior
exam. No mass lesion is noted on this unenhanced scan. No acute
cortical infarction.
IMPRESSION: No acute intracranial abnormality.  No significant change.

## 2015-09-17 ENCOUNTER — Other Ambulatory Visit: Payer: Self-pay | Admitting: Emergency Medicine

## 2015-09-17 ENCOUNTER — Telehealth: Payer: Self-pay

## 2015-09-17 MED ORDER — MELOXICAM 7.5 MG PO TABS: 7.5000 mg | ORAL_TABLET | Freq: Every day | ORAL | Status: DC

## 2015-09-17 MED ORDER — AMOXICILLIN-POT CLAVULANATE 875-125 MG PO TABS: 1.0000 | ORAL_TABLET | Freq: Two times a day (BID) | ORAL | Status: DC

## 2015-09-17 NOTE — Telephone Encounter (Signed)
i forgot he is willing to risk kidney injury since he is in so much pain Both sent in Meds ordered this encounter  Medications  . amoxicillin-clavulanate (AUGMENTIN) 875-125 MG tablet    Sig: Take 1 tablet by mouth 2 (two) times daily.    Dispense:  20 tablet    Refill:  0  . meloxicam (MOBIC) 7.5 MG tablet    Sig: Take 1 tablet (7.5 mg total) by mouth daily.    Dispense:  30 tablet    Refill:  2   He should still f/u with vascular surgery about his infection so they can measure the bloodflow to his legs

## 2015-09-17 NOTE — Telephone Encounter (Signed)
Pt requesting refills for his Doxazopin, and Dupropion  Best phone for pt is [place]

## 2015-09-18 NOTE — Telephone Encounter (Signed)
Rxs sent

## 2015-09-26 ENCOUNTER — Telehealth: Payer: Self-pay | Admitting: Emergency Medicine

## 2015-09-26 NOTE — Telephone Encounter (Signed)
Call patient and let him know he did not have the carotid ultrasound that we ordered. Ask if we can go ahead and reschedule this so schedule bilateral carotid ultrasound.

## 2015-10-05 NOTE — Telephone Encounter (Signed)
Left message for pt to call back  °

## 2015-10-16 ENCOUNTER — Telehealth: Payer: Self-pay

## 2015-10-16 ENCOUNTER — Other Ambulatory Visit: Payer: Self-pay | Admitting: Emergency Medicine

## 2015-10-16 DIAGNOSIS — M5442 Lumbago with sciatica, left side: Principal | ICD-10-CM

## 2015-10-16 DIAGNOSIS — M5441 Lumbago with sciatica, right side: Secondary | ICD-10-CM

## 2015-10-16 MED ORDER — METHOCARBAMOL 500 MG PO TABS: 500.0000 mg | ORAL_TABLET | Freq: Four times a day (QID) | ORAL | Status: DC | PRN

## 2015-10-16 MED ORDER — HYDROCODONE-ACETAMINOPHEN 5-325 MG PO TABS: 1.0000 | ORAL_TABLET | Freq: Four times a day (QID) | ORAL | Status: DC | PRN

## 2015-10-16 NOTE — Telephone Encounter (Signed)
Please advise on the 2 refills listed.

## 2015-10-16 NOTE — Telephone Encounter (Signed)
LMVM that patient's RX (Norco) is ready to pickup at front desk of [facility] center.

## 2015-10-16 NOTE — Telephone Encounter (Addendum)
Pt states he is out of his HYDROCODONE 5-325 MG. Also is out of ROBAXIN 500 MG and another medicine. Please call [phone]     [facility]

## 2015-10-22 MED ORDER — HYDROCODONE-ACETAMINOPHEN 5-325 MG PO TABS: ORAL_TABLET | ORAL | Status: DC

## 2015-10-22 NOTE — Telephone Encounter (Signed)
Pt came into pick up Rx from [facility] and they sent him to [facility] to ask for change in Rx. Dr Laney Pastor always has written for #90 which will last him a whole month. Dr Everlene Farrier, changed Rx to Q 6 hrs which would be #120 for a month's Rx, but only wrote for #30. Pt is in lobby waiting for Dr Everlene Farrier to review. Pended for max dose of Q6hrs for a month, #120.

## 2015-11-10 ENCOUNTER — Ambulatory Visit (INDEPENDENT_AMBULATORY_CARE_PROVIDER_SITE_OTHER): Payer: Medicare Other

## 2015-11-10 ENCOUNTER — Ambulatory Visit (INDEPENDENT_AMBULATORY_CARE_PROVIDER_SITE_OTHER): Payer: Medicare Other | Admitting: Internal Medicine

## 2015-11-10 VITALS — BP 132/72 | HR 63 | Temp 97.7°F | Resp 16 | Ht 69.0 in | Wt 241.6 lb

## 2015-11-10 DIAGNOSIS — R739 Hyperglycemia, unspecified: Secondary | ICD-10-CM | POA: Diagnosis not present

## 2015-11-10 DIAGNOSIS — M25572 Pain in left ankle and joints of left foot: Secondary | ICD-10-CM

## 2015-11-10 DIAGNOSIS — R5383 Other fatigue: Secondary | ICD-10-CM | POA: Diagnosis not present

## 2015-11-10 DIAGNOSIS — M25579 Pain in unspecified ankle and joints of unspecified foot: Secondary | ICD-10-CM | POA: Diagnosis not present

## 2015-11-10 LAB — POCT CBC
Granulocyte percent: 70 %G (ref 37–80)
HCT, POC: 48 % (ref 43.5–53.7)
Hemoglobin: 16.6 g/dL (ref 14.1–18.1)
LYMPH, POC: 1.5 (ref 0.6–3.4)
MCH: 31.2 pg (ref 27–31.2)
MCHC: 34.6 g/dL (ref 31.8–35.4)
MCV: 90.3 fL (ref 80–97)
MID (CBC): 0.4 (ref 0–0.9)
MPV: 7.6 fL (ref 0–99.8)
PLATELET COUNT, POC: 185 10*3/uL (ref 142–424)
POC Granulocyte: 4.4 (ref 2–6.9)
POC LYMPH %: 23.8 % (ref 10–50)
POC MID %: 6.2 % (ref 0–12)
RBC: 5.32 M/uL (ref 4.69–6.13)
RDW, POC: 13.6 %
WBC: 6.3 10*3/uL (ref 4.6–10.2)

## 2015-11-10 LAB — POCT GLYCOSYLATED HEMOGLOBIN (HGB A1C): Hemoglobin A1C: 5.5

## 2015-11-10 NOTE — Progress Notes (Signed)
Subjective:    Patient ID: [name], male    DOB: [birth year], 74 y.o.   MRN: [id number]  HPI  Chief Complaint  Patient presents with  . Fatigue  . Foot Pain    LEFT X 2 WEEKS  L foot pain forefoot at night when still Sleeps elevated duye to venous insufficiency Throbs--2 w/no injury  Energy level waxes and wanes recently Was working 10-12h days Cold hands yesterday, then wiped out Wakes often with the pain   Current outpatient prescriptions:  .  buPROPion (WELLBUTRIN XL) 300 MG 24 hr tablet, TAKE 1 TABLET BY MOUTH DAILY *NEEDS OFFICE VISITFOR ADDITIONAL REFILLS, Disp: 30 tablet, Rfl: 2 .  calcium carbonate 200 MG capsule, Take 200 mg by mouth daily. , Disp: , Rfl:  .  cetirizine (ZYRTEC) 10 MG tablet, Take 10 mg by mouth daily. , Disp: , Rfl:  .  doxazosin (CARDURA) 8 MG tablet, TAKE 1 TABLET BY MOUTH DAILY *NEEDS OFFICE VISITFOR ADDITIONAL REFILLS- OVERDUE FOR FOLLOW UP, Disp: 30 tablet, Rfl: 2 .  esomeprazole (NEXIUM) 20 MG packet, Take 20 mg by mouth daily before breakfast., Disp: , Rfl:  .  flurazepam (DALMANE) 15 MG capsule, Take 1 capsule (15 mg total) by mouth at bedtime as needed for sleep., Disp: 30 capsule, Rfl: 3 .  fluticasone (FLONASE) 50 MCG/ACT nasal spray, Place 2 sprays into the nose daily., Disp: 16 g, Rfl: 6 .  furosemide (LASIX) 40 MG tablet, TAKE 1 TABLET BY MOUTH DAILY., Disp: 90 tablet, Rfl: 0 .  gabapentin (NEURONTIN) 300 MG capsule, Take 1-2 capsules at bedtime, Disp: 180 capsule, Rfl: 1 .  glucosamine-chondroitin 500-400 MG tablet, Take 1 tablet by mouth 2 (two) times daily., Disp: , Rfl:  .  HYDROcodone-acetaminophen (NORCO) 5-325 MG tablet, 1 tab every 8 hours, Disp: 90 tablet, Rfl: 0 .  LORazepam (ATIVAN) 0.5 MG tablet, Take 1 tablet (0.5 mg total) by mouth 2 (two) times daily as needed for anxiety., Disp: 30 tablet, Rfl: 3 .  meloxicam (MOBIC) 7.5 MG tablet, uses maybe once a weekTake 1 tablet (7.5 mg total) by mouth daily., Disp: 30 tablet,  Rfl: 2 .  methocarbamol (ROBAXIN) 500 MG tablet, Take 1 tablet (500 mg total) by mouth every 6 (six) hours as needed for muscle spasms., Disp: 60 tablet, Rfl: 3 .  Multiple Vitamin (MULTIVITAMIN) tablet, Take 1 tablet by mouth daily., Disp: , Rfl:  .  NEEDLE, DISP, 21 G 21G X 1-1/2" MISC, Use to inject depotestosterone., Disp: 50 each, Rfl: 0 .  potassium chloride SA (K-DUR,KLOR-CON) 20 MEQ tablet, TAKE 2 TABLETS BY MOUTH DAILY., Disp: 180 tablet, Rfl: 1 .  Syringe, Reusable, 3 ML MISC, Use to inject depotestosterone., Disp: 1 each, Rfl: 0 .  testosterone cypionate (DEPOTESTOSTERONE CYPIONATE) 200 MG/ML injection, Inject 0.5 mLs (100 mg total) into the muscle every 7 (seven) days., Disp: 10 mL, Rfl: 5 .  sildenafil (REVATIO) 20 MG tablet, Take 5 tablets daily as needed for erectile dysfunction. (Patient not taking: Reported on 11/10/2015), Disp: 50 tablet, Rfl: 11   Review of Systems  Constitutional: Positive for fatigue. Negative for fever, appetite change and unexpected weight change.  HENT: Negative for trouble swallowing.   Eyes: Negative for visual disturbance.  Respiratory: Negative for shortness of breath and wheezing.   Cardiovascular: Negative for chest pain and palpitations.       Leg swelling fr chr ven insuff-wears compr stock  Gastrointestinal: Negative for abdominal pain.  Genitourinary: Negative for difficulty urinating.  Neurological: Negative for headaches.       Objective:   Physical Exam BP 132/72 mmHg  Pulse 63  Temp(Src) 97.7 F (36.5 C) (Oral)  Resp 16  Ht 5\' 9"  (1.753 m)  Wt 241 lb 9.6 oz (109.589 kg)  BMI 35.66 kg/m2  SpO2 98% HEENT clear No thyromegaly Heart regular without murmur Lungs clear Left foot is remarkably tender at the base of the first metatarsal with some mild swelling forefoot but no redness and no change in range of motion of the toes or ankle Mild edema w/ compr stock Good perip pulse No sens loss  Wt Readings from Last 3 Encounters:   11/10/15 241 lb 9.6 oz (109.589 kg)  08/27/15 247 lb 6 oz (112.209 kg)  05/22/15 231 lb (104.781 kg)   [facility] reading (PRIMARY) by  Dr. Stasia Cavalier bony changes around prox 1st metatars      Assessment & Plan:  Pain in joint, ankle and foot, left - Plan: POCT CBC, CK, Uric acid, Sedimentation Rate, DG Foot Complete Left  Other fatigue - Plan: Comprehensive metabolic panel, TSH, CK  Hyperglycemia - Plan: POCT glycosylated hemoglobin (Hb A1C)  Mild renal insuff--stable  No orders  were placed in this encounter and he will use his 7.5 melox 2 qd til labs available  Addendum 11/12/15 Labs Results for orders placed or performed in visit on 11/10/15  Comprehensive metabolic panel  Result Value Ref Range   Sodium 138 135 - 146 mmol/L   Potassium 3.6 3.5 - 5.3 mmol/L   Chloride 100 98 - 110 mmol/L   CO2 26 20 - 31 mmol/L   Glucose, Bld 117 (H) 65 - 99 mg/dL   BUN 13 7 - 25 mg/dL   Creat 1.36 (H) 0.70 - 1.18 mg/dL   Total Bilirubin 0.5 0.2 - 1.2 mg/dL   Alkaline Phosphatase 99 40 - 115 U/L   AST 28 10 - 35 U/L   ALT 30 9 - 46 U/L   Total Protein 7.2 6.1 - 8.1 g/dL   Albumin 3.9 3.6 - 5.1 g/dL   Calcium 8.5 (L) 8.6 - 10.3 mg/dL  TSH  Result Value Ref Range   TSH 2.359 0.350 - 4.500 uIU/mL  CK  Result Value Ref Range   Total CK 236 (H) 7 - 232 U/L  Uric acid  Result Value Ref Range   Uric Acid, Serum 7.3 4.0 - 7.8 mg/dL  Sedimentation Rate  Result Value Ref Range   Sed Rate 7 0 - 20 mm/hr  POCT CBC  Result Value Ref Range   WBC 6.3 4.6 - 10.2 K/uL   Lymph, poc 1.5 0.6 - 3.4   POC LYMPH PERCENT 23.8 10 - 50 %L   MID (cbc) 0.4 0 - 0.9   POC MID % 6.2 0 - 12 %M   POC Granulocyte 4.4 2 - 6.9   Granulocyte percent 70.0 37 - 80 %G   RBC 5.32 4.69 - 6.13 M/uL   Hemoglobin 16.6 14.1 - 18.1 g/dL   HCT, POC 48.0 43.5 - 53.7 %   MCV 90.3 80 - 97 fL   MCH, POC 31.2 27 - 31.2 pg   MCHC 34.6 31.8 - 35.4 g/dL   RDW, POC 13.6 %   Platelet Count, POC 185 142 - 424 K/uL   MPV 7.6  0 - 99.8 fL  POCT glycosylated hemoglobin (Hb A1C)  Result Value Ref Range   Hemoglobin A1C 5.5    If he isn't responding to  melox will consider trial of colchicine See no answer for fatigue from labs

## 2015-11-11 LAB — COMPREHENSIVE METABOLIC PANEL
ALT: 30 U/L (ref 9–46)
AST: 28 U/L (ref 10–35)
Albumin: 3.9 g/dL (ref 3.6–5.1)
Alkaline Phosphatase: 99 U/L (ref 40–115)
BUN: 13 mg/dL (ref 7–25)
CHLORIDE: 100 mmol/L (ref 98–110)
CO2: 26 mmol/L (ref 20–31)
CREATININE: 1.36 mg/dL — AB (ref 0.70–1.18)
Calcium: 8.5 mg/dL — ABNORMAL LOW (ref 8.6–10.3)
GLUCOSE: 117 mg/dL — AB (ref 65–99)
Potassium: 3.6 mmol/L (ref 3.5–5.3)
SODIUM: 138 mmol/L (ref 135–146)
TOTAL PROTEIN: 7.2 g/dL (ref 6.1–8.1)
Total Bilirubin: 0.5 mg/dL (ref 0.2–1.2)

## 2015-11-11 LAB — SEDIMENTATION RATE: Sed Rate: 7 mm/hr (ref 0–20)

## 2015-11-11 LAB — CK: Total CK: 236 U/L — ABNORMAL HIGH (ref 7–232)

## 2015-11-11 LAB — TSH: TSH: 2.359 u[IU]/mL (ref 0.350–4.500)

## 2015-11-11 LAB — URIC ACID: Uric Acid, Serum: 7.3 mg/dL (ref 4.0–7.8)

## 2015-11-12 ENCOUNTER — Telehealth: Payer: Self-pay

## 2015-11-12 NOTE — Telephone Encounter (Signed)
Patient lost his cell phone and wants to make sure we use this number temporarily for lab results until he finds his phone. [phone]

## 2015-11-12 NOTE — Telephone Encounter (Signed)
Please put this as contact number in the patient's chart.

## 2015-11-12 NOTE — Telephone Encounter (Signed)
See labs 

## 2015-11-19 ENCOUNTER — Other Ambulatory Visit: Payer: Self-pay | Admitting: Internal Medicine

## 2015-11-20 ENCOUNTER — Telehealth: Payer: Self-pay

## 2015-11-20 NOTE — Telephone Encounter (Signed)
Patient states Dr. Laney Pastor increased his meloxicam from 1 per day to 2 per day. He says the refill was sent to [facility] and refilled but it was for 1 per day instead of 2 per day. Can we change it? He only has enough for 15 days. Cb# [phone].

## 2015-11-22 ENCOUNTER — Telehealth: Payer: Self-pay

## 2015-11-22 MED ORDER — MELOXICAM 7.5 MG PO TABS: ORAL_TABLET | ORAL | Status: DC

## 2015-11-22 NOTE — Telephone Encounter (Signed)
Patient is calling for lab results and would also like a refill for hydrocodone

## 2015-11-22 NOTE — Telephone Encounter (Signed)
Left message,, Rx sent in.

## 2015-11-22 NOTE — Telephone Encounter (Signed)
Sent to [facility]  

## 2015-11-22 NOTE — Telephone Encounter (Signed)
Patient current number is [phone]

## 2015-11-23 ENCOUNTER — Other Ambulatory Visit: Payer: Self-pay | Admitting: Emergency Medicine

## 2015-11-23 DIAGNOSIS — M5442 Lumbago with sciatica, left side: Principal | ICD-10-CM

## 2015-11-23 DIAGNOSIS — M5441 Lumbago with sciatica, right side: Secondary | ICD-10-CM

## 2015-11-23 MED ORDER — HYDROCODONE-ACETAMINOPHEN 5-325 MG PO TABS: ORAL_TABLET | ORAL | Status: DC

## 2015-11-23 NOTE — Telephone Encounter (Signed)
See labs. Left message on machine to call back

## 2015-11-23 NOTE — Telephone Encounter (Signed)
LMOM for pt that Rx is ready, also lab letter mailed. Advised if he wants to know results sooner he may call back.

## 2015-11-23 NOTE — Telephone Encounter (Signed)
[name] was seen by Dr. Laney Pastor. The results of his lab work should come from Dr. Laney Pastor who saw him. I did refill his hydrocodone.

## 2015-12-11 ENCOUNTER — Other Ambulatory Visit: Payer: Self-pay | Admitting: Emergency Medicine

## 2015-12-21 ENCOUNTER — Other Ambulatory Visit: Payer: Self-pay

## 2015-12-21 MED ORDER — LORAZEPAM 0.5 MG PO TABS: 0.5000 mg | ORAL_TABLET | Freq: Two times a day (BID) | ORAL | Status: DC | PRN

## 2015-12-21 NOTE — Telephone Encounter (Signed)
Pharm reqs RF of lorazepam. Pended. 

## 2015-12-24 ENCOUNTER — Telehealth: Payer: Self-pay | Admitting: *Deleted

## 2015-12-24 NOTE — Telephone Encounter (Signed)
[facility] sent over form in regards to pt taking Lorazepam and Hydrocodone.  Please sign?  Form is in Dr. Caren Griffins box.

## 2015-12-24 NOTE — Telephone Encounter (Signed)
Called in.

## 2015-12-25 NOTE — Telephone Encounter (Signed)
Dr Laney Pastor I have placed this in your box for you to address.

## 2015-12-31 ENCOUNTER — Telehealth: Payer: Self-pay

## 2015-12-31 NOTE — Telephone Encounter (Signed)
Pt is in [place] and left his doxazosin at home and would like to have [place] to call [facility] and to speak with April and she would help to refill 7 days worth -[phone]  His number is [phone]

## 2015-12-31 NOTE — Telephone Encounter (Signed)
Called and spoke with April @ [facility]. Gave verbal to fill # 7. Patient notified and voiced understanding

## 2016-01-07 ENCOUNTER — Other Ambulatory Visit: Payer: Self-pay

## 2016-01-07 ENCOUNTER — Telehealth: Payer: Self-pay

## 2016-01-07 ENCOUNTER — Telehealth: Payer: Self-pay | Admitting: Family Medicine

## 2016-01-07 DIAGNOSIS — E291 Testicular hypofunction: Secondary | ICD-10-CM

## 2016-01-07 DIAGNOSIS — E349 Endocrine disorder, unspecified: Secondary | ICD-10-CM

## 2016-01-07 MED ORDER — TESTOSTERONE CYPIONATE 100 MG/ML IM SOLN: 100.0000 mg | INTRAMUSCULAR | Status: DC

## 2016-01-07 NOTE — Telephone Encounter (Signed)
Pharm reqs RFs of flurazepam. Dr Everlene Farrier, you are the only provider who has Rxd this for pt in [facility], but pt has seen Dr Laney Pastor the last several check ups, so I wasn't sure who to send this to?

## 2016-01-07 NOTE — Telephone Encounter (Signed)
Patient called regarding testosterone refill. Refill was for 200mg /ml inject 0.5 ml into muscle every 7 days

## 2016-01-07 NOTE — Telephone Encounter (Signed)
Pt states this is a follow up call from the last one for [name]-stated she would know what it was about   Best number [phone]

## 2016-01-07 NOTE — Telephone Encounter (Signed)
Has been getting 100mg /ml inject 1 ml into muscle q 7days due to the multi-dose vial guidelines. Last RX called in and went by 08/2015 rx. Gave verbal to pharmacist at [facility] to fill 100mg /ml. Patient notified and voiced understanding.

## 2016-01-08 MED ORDER — FLURAZEPAM HCL 15 MG PO CAPS: 15.0000 mg | ORAL_CAPSULE | Freq: Every evening | ORAL | Status: DC | PRN

## 2016-01-08 NOTE — Telephone Encounter (Signed)
Faxed

## 2016-01-13 NOTE — Telephone Encounter (Signed)
Returned patient call. He had questions about flurazepam refill request. Refill done

## 2016-01-16 ENCOUNTER — Telehealth: Payer: Self-pay

## 2016-01-16 NOTE — Telephone Encounter (Signed)
Pt needs a refill on Hydrocodone  Please advise  [phone]

## 2016-01-17 ENCOUNTER — Other Ambulatory Visit: Payer: Self-pay | Admitting: Emergency Medicine

## 2016-01-17 DIAGNOSIS — M5442 Lumbago with sciatica, left side: Principal | ICD-10-CM

## 2016-01-17 DIAGNOSIS — M5441 Lumbago with sciatica, right side: Secondary | ICD-10-CM

## 2016-01-17 MED ORDER — HYDROCODONE-ACETAMINOPHEN 5-325 MG PO TABS: ORAL_TABLET | ORAL | Status: DC

## 2016-01-17 NOTE — Telephone Encounter (Signed)
LMVM RX ready to pickup at front desk of [facility] appointment center.

## 2016-01-25 ENCOUNTER — Other Ambulatory Visit: Payer: Self-pay | Admitting: Emergency Medicine

## 2016-01-25 NOTE — Telephone Encounter (Signed)
Pt is needing to get a refill on welbutrin and lasix pt states he is almost out  And [facility] will be sending over a request for these meds as well  Best number [phone]

## 2016-01-26 ENCOUNTER — Other Ambulatory Visit: Payer: Self-pay | Admitting: *Deleted

## 2016-01-26 DIAGNOSIS — E291 Testicular hypofunction: Secondary | ICD-10-CM

## 2016-01-26 DIAGNOSIS — I872 Venous insufficiency (chronic) (peripheral): Secondary | ICD-10-CM

## 2016-01-26 MED ORDER — FUROSEMIDE 40 MG PO TABS: 40.0000 mg | ORAL_TABLET | Freq: Every day | ORAL | Status: DC

## 2016-01-26 MED ORDER — BUPROPION HCL ER (XL) 300 MG PO TB24: ORAL_TABLET | ORAL | Status: DC

## 2016-02-07 ENCOUNTER — Other Ambulatory Visit: Payer: Self-pay | Admitting: Emergency Medicine

## 2016-02-09 ENCOUNTER — Telehealth: Payer: Self-pay

## 2016-02-09 MED ORDER — DOXAZOSIN MESYLATE 8 MG PO TABS: ORAL_TABLET | ORAL | Status: DC

## 2016-02-09 NOTE — Telephone Encounter (Signed)
Pt would like a refill on his doxazosin (CARDURA) 8 MG tablet [id number]. He was told he needs an OV before he can get a refill on this by his pharmacy. He says he should be able to get this because he was seen recently here. He uses [facility] and they close at 6. Please advise at [phone]

## 2016-02-09 NOTE — Telephone Encounter (Signed)
One refill sent in for doxazosin.  Notes are confusing.  When would you like for patient to come back in?

## 2016-02-10 NOTE — Telephone Encounter (Signed)
He has been seen mostly for acute problems. He should have a regular checkup at some time in the next 4-6 weeks.

## 2016-02-18 ENCOUNTER — Other Ambulatory Visit: Payer: Self-pay | Admitting: Internal Medicine

## 2016-02-28 NOTE — Telephone Encounter (Signed)
Spoke with pt

## 2016-03-10 ENCOUNTER — Telehealth: Payer: Self-pay

## 2016-03-10 ENCOUNTER — Other Ambulatory Visit: Payer: Self-pay | Admitting: Emergency Medicine

## 2016-03-10 NOTE — Telephone Encounter (Signed)
Patient is calling to request a refill for hydrocodone

## 2016-03-10 NOTE — Telephone Encounter (Signed)
Patient is calling to request a refill for hydrocodone.

## 2016-03-11 ENCOUNTER — Telehealth: Payer: Self-pay

## 2016-03-11 ENCOUNTER — Ambulatory Visit (INDEPENDENT_AMBULATORY_CARE_PROVIDER_SITE_OTHER): Payer: Medicare Other | Admitting: Emergency Medicine

## 2016-03-11 VITALS — BP 142/82 | HR 81 | Temp 97.8°F | Resp 16 | Ht 69.0 in | Wt 241.0 lb

## 2016-03-11 DIAGNOSIS — G4700 Insomnia, unspecified: Secondary | ICD-10-CM | POA: Diagnosis not present

## 2016-03-11 DIAGNOSIS — N400 Enlarged prostate without lower urinary tract symptoms: Secondary | ICD-10-CM | POA: Diagnosis not present

## 2016-03-11 DIAGNOSIS — M5442 Lumbago with sciatica, left side: Secondary | ICD-10-CM

## 2016-03-11 DIAGNOSIS — R739 Hyperglycemia, unspecified: Secondary | ICD-10-CM | POA: Diagnosis not present

## 2016-03-11 DIAGNOSIS — Z91048 Other nonmedicinal substance allergy status: Secondary | ICD-10-CM | POA: Diagnosis not present

## 2016-03-11 DIAGNOSIS — K2100 Gastro-esophageal reflux disease with esophagitis, without bleeding: Secondary | ICD-10-CM

## 2016-03-11 DIAGNOSIS — R600 Localized edema: Secondary | ICD-10-CM

## 2016-03-11 DIAGNOSIS — E785 Hyperlipidemia, unspecified: Secondary | ICD-10-CM

## 2016-03-11 DIAGNOSIS — M5441 Lumbago with sciatica, right side: Secondary | ICD-10-CM

## 2016-03-11 DIAGNOSIS — R609 Edema, unspecified: Secondary | ICD-10-CM | POA: Diagnosis not present

## 2016-03-11 DIAGNOSIS — Z125 Encounter for screening for malignant neoplasm of prostate: Secondary | ICD-10-CM

## 2016-03-11 DIAGNOSIS — E291 Testicular hypofunction: Secondary | ICD-10-CM | POA: Diagnosis not present

## 2016-03-11 DIAGNOSIS — F329 Major depressive disorder, single episode, unspecified: Secondary | ICD-10-CM

## 2016-03-11 DIAGNOSIS — F411 Generalized anxiety disorder: Secondary | ICD-10-CM | POA: Diagnosis not present

## 2016-03-11 DIAGNOSIS — M159 Polyosteoarthritis, unspecified: Secondary | ICD-10-CM

## 2016-03-11 DIAGNOSIS — M5430 Sciatica, unspecified side: Secondary | ICD-10-CM | POA: Diagnosis not present

## 2016-03-11 DIAGNOSIS — F32A Depression, unspecified: Secondary | ICD-10-CM

## 2016-03-11 DIAGNOSIS — Z9109 Other allergy status, other than to drugs and biological substances: Secondary | ICD-10-CM

## 2016-03-11 DIAGNOSIS — M150 Primary generalized (osteo)arthritis: Secondary | ICD-10-CM | POA: Diagnosis not present

## 2016-03-11 LAB — COMPLETE METABOLIC PANEL WITH GFR
ALT: 24 U/L (ref 9–46)
AST: 22 U/L (ref 10–35)
Albumin: 4.2 g/dL (ref 3.6–5.1)
Alkaline Phosphatase: 91 U/L (ref 40–115)
BUN: 16 mg/dL (ref 7–25)
CO2: 27 mmol/L (ref 20–31)
Calcium: 8.9 mg/dL (ref 8.6–10.3)
Chloride: 102 mmol/L (ref 98–110)
Creat: 1.59 mg/dL — ABNORMAL HIGH (ref 0.70–1.18)
GFR, Est African American: 49 mL/min — ABNORMAL LOW (ref >=60)
GFR, Est Non African American: 42 mL/min — ABNORMAL LOW (ref >=60)
Glucose, Bld: 88 mg/dL (ref 65–99)
Potassium: 4 mmol/L (ref 3.5–5.3)
Sodium: 137 mmol/L (ref 135–146)
Total Bilirubin: 0.7 mg/dL (ref 0.2–1.2)
Total Protein: 7.1 g/dL (ref 6.1–8.1)

## 2016-03-11 LAB — POCT CBC
Granulocyte percent: 75.3 %G (ref 37–80)
HCT, POC: 46.7 % (ref 43.5–53.7)
Hemoglobin: 16.6 g/dL (ref 14.1–18.1)
LYMPH, POC: 1.4 (ref 0.6–3.4)
MCH: 32.4 pg — AB (ref 27–31.2)
MCHC: 35.6 g/dL — AB (ref 31.8–35.4)
MCV: 90.9 fL (ref 80–97)
MID (CBC): 0.4 (ref 0–0.9)
MPV: 7.1 fL (ref 0–99.8)
PLATELET COUNT, POC: 210 10*3/uL (ref 142–424)
POC Granulocyte: 5.5 (ref 2–6.9)
POC LYMPH %: 19.2 % (ref 10–50)
POC MID %: 5.5 %M (ref 0–12)
RBC: 5.14 M/uL (ref 4.69–6.13)
RDW, POC: 13.6 %
WBC: 7.3 10*3/uL (ref 4.6–10.2)

## 2016-03-11 LAB — LIPID PANEL
CHOLESTEROL: 175 mg/dL (ref 125–200)
HDL: 38 mg/dL — ABNORMAL LOW (ref >=40)
LDL Cholesterol: 97 mg/dL (ref <130)
TRIGLYCERIDES: 199 mg/dL — AB (ref <150)
Total CHOL/HDL Ratio: 4.6 Ratio (ref <=5.0)
VLDL: 40 mg/dL — AB (ref <30)

## 2016-03-11 LAB — GLUCOSE, POCT (MANUAL RESULT ENTRY): POC GLUCOSE: 83 mg/dL (ref 70–99)

## 2016-03-11 LAB — POCT GLYCOSYLATED HEMOGLOBIN (HGB A1C): Hemoglobin A1C: 5.8

## 2016-03-11 MED ORDER — FUROSEMIDE 40 MG PO TABS: 40.0000 mg | ORAL_TABLET | Freq: Every day | ORAL | Status: DC

## 2016-03-11 MED ORDER — HYDROCODONE-ACETAMINOPHEN 5-325 MG PO TABS: ORAL_TABLET | ORAL | Status: DC

## 2016-03-11 MED ORDER — LORAZEPAM 0.5 MG PO TABS: 0.5000 mg | ORAL_TABLET | Freq: Two times a day (BID) | ORAL | Status: DC | PRN

## 2016-03-11 MED ORDER — BUPROPION HCL ER (XL) 300 MG PO TB24: ORAL_TABLET | ORAL | Status: DC

## 2016-03-11 MED ORDER — MELOXICAM 7.5 MG PO TABS: ORAL_TABLET | ORAL | Status: DC

## 2016-03-11 MED ORDER — ESOMEPRAZOLE MAGNESIUM 20 MG PO CPDR: 20.0000 mg | DELAYED_RELEASE_CAPSULE | Freq: Every day | ORAL | Status: DC

## 2016-03-11 MED ORDER — METHOCARBAMOL 500 MG PO TABS
500.0000 mg | ORAL_TABLET | Freq: Four times a day (QID) | ORAL | Status: DC | PRN
Start: 2016-03-11 — End: 2021-08-08

## 2016-03-11 MED ORDER — FLUTICASONE PROPIONATE 50 MCG/ACT NA SUSP: 2.0000 | Freq: Every day | NASAL | Status: DC

## 2016-03-11 MED ORDER — DOXAZOSIN MESYLATE 8 MG PO TABS
ORAL_TABLET | ORAL | Status: DC
Start: 2016-03-11 — End: 2023-12-02

## 2016-03-11 MED ORDER — POTASSIUM CHLORIDE CRYS ER 20 MEQ PO TBCR: 40.0000 meq | EXTENDED_RELEASE_TABLET | Freq: Every day | ORAL | Status: DC

## 2016-03-11 MED ORDER — ESOMEPRAZOLE MAGNESIUM 20 MG PO PACK: 20.0000 mg | PACK | Freq: Every day | ORAL | Status: DC

## 2016-03-11 MED ORDER — GABAPENTIN 300 MG PO CAPS: ORAL_CAPSULE | ORAL | Status: DC

## 2016-03-11 MED ORDER — FLURAZEPAM HCL 15 MG PO CAPS: 15.0000 mg | ORAL_CAPSULE | Freq: Every evening | ORAL | Status: DC | PRN

## 2016-03-11 NOTE — Patient Instructions (Signed)
     IF you received an x-ray today, you will receive an invoice from [facility]. Please contact [facility] at [phone] with questions or concerns regarding your invoice.   IF you received labwork today, you will receive an invoice from [facility]. Please contact [facility] at [phone] with questions or concerns regarding your invoice.   Our billing staff will not be able to assist you with questions regarding bills from these companies.  You will be contacted with the lab results as soon as they are available. The fastest way to get your results is to activate your My Chart account. Instructions are located on the last page of this paperwork. If you have not heard from us regarding the results in 2 weeks, please contact this office.      

## 2016-03-11 NOTE — Progress Notes (Addendum)
By signing my name below, I, Mesha Guinyard, attest that this documentation has been prepared under the direction and in the presence of [name], MD.  Electronically Signed: Verlee Monte, Medical Scribe. 03/11/2016. 1:10 PM.  Chief Complaint:  Chief Complaint  Patient presents with  . Medication Refill    HPI: [name] is a 75 y.o. male who [facility] for a medication refill. Pt wants to get his blood work to check his testosterone levels. Pt takes hydrocodone QD or BID if he's having a hard day at work. Pt mentions his hernia gets uncomfortable when he's "irregular".   Past Medical History  Diagnosis Date  . 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 Sleep apnea   . 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 Allergy     takes Zyrtec daily  . Neuromuscular disorder ([place])   . Arthritis   . Back pain   . Dyspnea on exertion   . Hyperlipidemia   . Depression     takes Wellbutrin daily  . GERD (gastroesophageal reflux disease)     takes Nexium and Omeprazole daily  . Anxiety     takes Ativan daily as needed  . Hypertension   . History of kidney stones   . Umbilical hernia    Past Surgical History  Procedure Laterality Date  . Epidural steroid injections      every 6 months   Dr. Arlice Colt  . Hernia repair  2003  . Back surgery  2003    L4, L5  . Endovenous ablation saphenous vein w/ laser Right 06-01-2014    EVLA right greater saphenous vein by Curt Jews MD   . Back surgery    . Vascular surgery    . Hernia repair      hernia inguinal  . 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 Marital Status: Single    Spouse Name: N/A  . Number of Children: N/A    . Years of Education: N/A   Occupational History  . Antiques/Real Estate    Social History Main Topics  . Smoking status: Never Smoker   . Smokeless tobacco: Never Used  . Alcohol Use: No  . Drug Use: No  . Sexual Activity: Yes    Birth Control/ Protection: Condom   Other Topics Concern  . None   Social History Narrative   ** Merged History Encounter **       Single. Education: Other.    Family History  Problem Relation Age of Onset  . Thyroid disease Mother   . Heart disease Father   . Diabetes Maternal Grandfather    No Known Allergies Prior to Admission medications   Medication Sig Start Date End Date Taking? Authorizing Provider  buPROPion (WELLBUTRIN XL) 300 MG 24 hr tablet TAKE 1 TABLET BY MOUTH DAILY *NEEDS OFFICE VISIT 01/26/16  Yes [name], MD  calcium carbonate 200 MG capsule Take 200 mg by mouth daily.    Yes Historical Provider, MD  cetirizine (ZYRTEC) 10 MG tablet Take 10 mg by mouth daily.    Yes Historical Provider, MD  doxazosin (CARDURA)  8 MG tablet TAKE 1 TABLET BY MOUTH DAILY *NEEDS OFFICE VISITOR ADDITIONAL REFILLS-OVERDUE FOR FOLLOW UP 02/09/16  Yes [name], MD  esomeprazole (NEXIUM) 20 MG packet Take 20 mg by mouth daily before breakfast.   Yes Historical Provider, MD  flurazepam (DALMANE) 15 MG capsule Take 1 capsule (15 mg total) by mouth at bedtime as needed for sleep. 01/08/16  Yes [name], MD  fluticasone (FLONASE) 50 MCG/ACT nasal spray Place 2 sprays into the nose daily. 07/28/12  Yes Lyndal Pulley, DO  furosemide (LASIX) 40 MG tablet Take 1 tablet (40 mg total) by mouth daily. 01/26/16  Yes [name], MD  gabapentin (NEURONTIN) 300 MG capsule Take 1-2 capsules at bedtime 07/10/15  Yes [name], MD  glucosamine-chondroitin 500-400 MG tablet Take 1 tablet by mouth 2 (two) times daily.   Yes Historical Provider, MD  HYDROcodone-acetaminophen (NORCO) 5-325 MG tablet 1 tab every 8 hours 01/17/16  Yes [name], MD   LORazepam (ATIVAN) 0.5 MG tablet Take 1 tablet (0.5 mg total) by mouth 2 (two) times daily as needed for anxiety. 12/21/15  Yes [name], MD  meloxicam (MOBIC) 7.5 MG tablet TAKE 1 TABLET BY MOUTH DAILY OR TWICE DAILY IF NEEDED FOR THE NEXT MONTH 02/20/16  Yes [name], MD  methocarbamol (ROBAXIN) 500 MG tablet Take 1 tablet (500 mg total) by mouth every 6 (six) hours as needed for muscle spasms. 10/16/15  Yes [name], MD  Multiple Vitamin (MULTIVITAMIN) tablet Take 1 tablet by mouth daily.   Yes Historical Provider, MD  NEEDLE, DISP, 21 G 21G X 1-1/2" MISC Use to inject depotestosterone. 06/11/15  Yes [name], PA-C  potassium chloride SA (K-DUR,KLOR-CON) 20 MEQ tablet TAKE 2 TABLETS BY MOUTH DAILY. 06/09/15  Yes [name], MD  Syringe, Reusable, 3 ML MISC Use to inject depotestosterone. 06/11/15  Yes [name], PA-C  testosterone cypionate (DEPOTESTOTERONE CYPIONATE) 100 MG/ML injection Inject 1 mL (100 mg total) into the muscle every 7 (seven) days. For IM use only 01/07/16  Yes [name], MD  sildenafil (REVATIO) 20 MG tablet Take 5 tablets daily as needed for erectile dysfunction. Patient not taking: Reported on 11/10/2015 04/13/14   [name], MD    ROS: The patient denies fevers, chills, night sweats, unintentional weight loss, chest pain, palpitations, wheezing, dyspnea on exertion, nausea, vomiting, abdominal pain, dysuria, hematuria, melena, numbness, weakness, or tingling. Musculoskeletal: Pt has chronic problems with back. He had previous injections. He wants to go back for repeat epidural injections. He went to a orthopedic specialist, but can't remember which one. GU: He continues to have difficulty with urination. When he feels the urge to urinate he has to go quickly. He takes 100 mg of testosterone a week. He sates he need blood work today.  All other systems have been reviewed and were otherwise negative with the exception of those mentioned in the  HPI and as above.    PHYSICAL EXAM: Filed Vitals:   03/11/16 1156  BP: 142/82  Pulse: 81  Temp: 97.8 F (36.6 C)  Resp: 16   Body mass index is 35.57 kg/(m^2).   General: Alert, no acute distress HEENT:  Normocephalic, atraumatic, oropharynx patent. Throat nl.  Eye: Juliette Mangle [facility] Cardiovascular:  Regular rate and rhythm, no rubs murmurs or gallops.  No Carotid bruits, radial pulse intact. No pedal edema.  Respiratory: Clear to auscultation bilaterally.  No wheezes, rales, or rhonchi.  No cyanosis, no use of accessory musculature Abdominal: No organomegaly, abdomen is soft and non-tender, positive bowel sounds.  No masses. Abdomen obese 3 cm reducible hernia Musculoskeletal: Gait intact. No tenderness. Support stocking on 3+ edema GU: prostate enlarged. left lobe greater than right  Skin: No rashes. Neurologic: Facial musculature symmetric. Psychiatric: Patient acts appropriately throughout our interaction. Lymphatic: No cervical or submandibular lymphadenopathy  LABS: Results for orders placed or performed in visit on 03/11/16  POCT CBC  Result Value Ref Range   WBC 7.3 4.6 - 10.2 K/uL   Lymph, poc 1.4 0.6 - 3.4   POC LYMPH PERCENT 19.2 10 - 50 %L   MID (cbc) 0.4 0 - 0.9   POC MID % 5.5 0 - 12 %M   POC Granulocyte 5.5 2 - 6.9   Granulocyte percent 75.3 37 - 80 %G   RBC 5.14 4.69 - 6.13 M/uL   Hemoglobin 16.6 14.1 - 18.1 g/dL   HCT, POC 46.7 43.5 - 53.7 %   MCV 90.9 80 - 97 fL   MCH, POC 32.4 (A) 27 - 31.2 pg   MCHC 35.6 (A) 31.8 - 35.4 g/dL   RDW, POC 13.6 %   Platelet Count, POC 210 142 - 424 K/uL   MPV 7.1 0 - 99.8 fL  POCT glycosylated hemoglobin (Hb A1C)  Result Value Ref Range   Hemoglobin A1C 5.8   POCT glucose (manual entry)  Result Value Ref Range   POC Glucose 83 70 - 99 mg/dl     Meds ordered this encounter  Medications  . potassium chloride SA (K-DUR,KLOR-CON) 20 MEQ tablet    Sig: Take 2 tablets (40 mEq total) by mouth daily.    Dispense:  180  tablet    Refill:  1  . methocarbamol (ROBAXIN) 500 MG tablet    Sig: Take 1 tablet (500 mg total) by mouth every 6 (six) hours as needed for muscle spasms.    Dispense:  60 tablet    Refill:  3  . DISCONTD: meloxicam (MOBIC) 7.5 MG tablet    Sig: TAKE 1 TABLET BY MOUTH DAILY    Dispense:  60 tablet    Refill:  0    PER DR Julious Langlois PATIENT NEEDS REG CHECK UP FOR ADDITIONAL REFILLS  . LORazepam (ATIVAN) 0.5 MG tablet    Sig: Take 1 tablet (0.5 mg total) by mouth 2 (two) times daily as needed for anxiety.    Dispense:  30 tablet    Refill:  5  . HYDROcodone-acetaminophen (NORCO) 5-325 MG tablet    Sig: 1 tab every 8 hours    Dispense:  90 tablet    Refill:  0  . gabapentin (NEURONTIN) 300 MG capsule    Sig: Take 1-2 capsules at bedtime    Dispense:  180 capsule    Refill:  1  . furosemide (LASIX) 40 MG tablet    Sig: Take 1 tablet (40 mg total) by mouth daily.    Dispense:  30 tablet    Refill:  11    Office visit needed for refills  . fluticasone (FLONASE) 50 MCG/ACT nasal spray    Sig: Place 2 sprays into both nostrils daily.    Dispense:  16 g    Refill:  6  . flurazepam (DALMANE) 15 MG capsule    Sig: Take 1 capsule (15 mg total) by mouth at bedtime as needed for sleep.    Dispense:  30 capsule    Refill:  5  .  DISCONTD: esomeprazole (NEXIUM) 20 MG packet    Sig: Take 20 mg by mouth daily before breakfast.    Dispense:  30 each    Refill:  11  . doxazosin (CARDURA) 8 MG tablet    Sig: TAKE 1 TABLET BY MOUTH DAILY  . Take at night    Dispense:  30 tablet    Refill:  11    No more refills without office visit  . buPROPion (WELLBUTRIN XL) 300 MG 24 hr tablet    Sig: TAKE 1 TABLET BY MOUTH DAILY *NEEDS OFFICE VISIT    Dispense:  30 tablet    Refill:  11    Office visit needed for refills     EKG/XRAY:   Primary read interpreted by Dr. Everlene Farrier at [facility].   ASSESSMENT/PLAN: Meds were refilled. There were no major changes. He is taking 1-2 hydrocodone a day for pain in his  back and knee. I did refer him back to [facility] for their evaluation to see if he would benefit from epidurals. He does have a lot of urinary symptoms. He currently is on Cardura. He is seen Dr. Donnie Aho in the past. We will see what his PSA is doing and make further decisions once that his back. He overall is doing well with his depression and anxiety. Things are better with his 2 children.I personally performed the services described in this documentation, which was scribed in my presence. The recorded information has been reviewed and is accurate. He was concerned about his memory. He scored 30 out of 30 on his Mini-Mental status exam. I reassured him with this.   Gross sideeffects, risk and benefits, and alternatives of medications d/w patient. Patient is aware that all medications have potential sideeffects and we are unable to predict every sideeffect or drug-drug interaction that may occur.  [name] MD 03/11/2016 1:10 PM

## 2016-03-11 NOTE — Telephone Encounter (Signed)
Pharm called to ask for verification of two Rxs that were just sent over for meloxicam and esomeprazole. I checked w/Dr Everlene Farrier and re-sent the Rxs per Dr Perfecto Kingdom instr's. Called pharm back and advised they were re-sent.

## 2016-03-11 NOTE — Telephone Encounter (Signed)
Pt came in for OV and got this Rx.

## 2016-03-12 LAB — PSA, MEDICARE: PSA: 3.6 ng/mL (ref <=4.00)

## 2016-03-14 LAB — TESTOS,TOTAL,FREE AND SHBG (FEMALE)
SEX HORMONE BINDING GLOB.: 25 nmol/L (ref 22–77)
Testosterone, Free: 149.6 pg/mL — ABNORMAL HIGH (ref 30.0–135.0)
Testosterone,Total,LC/MS/MS: 671 ng/dL (ref 250–1100)

## 2016-03-17 ENCOUNTER — Other Ambulatory Visit: Payer: Self-pay | Admitting: Emergency Medicine

## 2016-03-17 DIAGNOSIS — E291 Testicular hypofunction: Secondary | ICD-10-CM

## 2016-03-19 DIAGNOSIS — M25572 Pain in left ankle and joints of left foot: Secondary | ICD-10-CM | POA: Diagnosis not present

## 2016-03-19 DIAGNOSIS — M25561 Pain in right knee: Secondary | ICD-10-CM | POA: Diagnosis not present

## 2016-03-19 DIAGNOSIS — M545 Low back pain: Secondary | ICD-10-CM | POA: Diagnosis not present

## 2016-03-20 ENCOUNTER — Other Ambulatory Visit: Payer: Self-pay | Admitting: Orthopedic Surgery

## 2016-03-20 DIAGNOSIS — M25572 Pain in left ankle and joints of left foot: Secondary | ICD-10-CM

## 2016-04-01 DIAGNOSIS — M961 Postlaminectomy syndrome, not elsewhere classified: Secondary | ICD-10-CM | POA: Diagnosis not present

## 2016-04-01 DIAGNOSIS — M4806 Spinal stenosis, lumbar region: Secondary | ICD-10-CM | POA: Diagnosis not present

## 2016-04-01 DIAGNOSIS — M5416 Radiculopathy, lumbar region: Secondary | ICD-10-CM | POA: Diagnosis not present

## 2016-04-02 ENCOUNTER — Ambulatory Visit
Admission: RE | Admit: 2016-04-02 | Discharge: 2016-04-02 | Disposition: A | Discharge status: Home / Self Care | Payer: Medicare Other | Source: Ambulatory Visit | Attending: Orthopedic Surgery | Admitting: Orthopedic Surgery

## 2016-04-02 DIAGNOSIS — M25572 Pain in left ankle and joints of left foot: Secondary | ICD-10-CM

## 2016-04-04 ENCOUNTER — Ambulatory Visit
Admission: RE | Admit: 2016-04-04 | Discharge: 2016-04-04 | Disposition: A | Discharge status: Home / Self Care | Payer: Medicare Other | Source: Ambulatory Visit | Attending: Orthopedic Surgery | Admitting: Orthopedic Surgery

## 2016-04-04 DIAGNOSIS — M65872 Other synovitis and tenosynovitis, left ankle and foot: Secondary | ICD-10-CM | POA: Diagnosis not present

## 2016-04-15 DIAGNOSIS — M4806 Spinal stenosis, lumbar region: Secondary | ICD-10-CM | POA: Diagnosis not present

## 2016-04-15 DIAGNOSIS — M961 Postlaminectomy syndrome, not elsewhere classified: Secondary | ICD-10-CM | POA: Diagnosis not present

## 2016-04-15 DIAGNOSIS — M5416 Radiculopathy, lumbar region: Secondary | ICD-10-CM | POA: Diagnosis not present

## 2016-04-17 DIAGNOSIS — M76812 Anterior tibial syndrome, left leg: Secondary | ICD-10-CM | POA: Diagnosis not present

## 2016-04-21 DIAGNOSIS — N200 Calculus of kidney: Secondary | ICD-10-CM | POA: Diagnosis not present

## 2016-04-21 DIAGNOSIS — R3915 Urgency of urination: Secondary | ICD-10-CM | POA: Diagnosis not present

## 2016-04-21 DIAGNOSIS — E291 Testicular hypofunction: Secondary | ICD-10-CM | POA: Diagnosis not present

## 2016-04-21 DIAGNOSIS — N320 Bladder-neck obstruction: Secondary | ICD-10-CM | POA: Diagnosis not present

## 2016-04-21 DIAGNOSIS — R3121 Asymptomatic microscopic hematuria: Secondary | ICD-10-CM | POA: Diagnosis not present

## 2016-05-15 DIAGNOSIS — M545 Low back pain: Secondary | ICD-10-CM | POA: Diagnosis not present

## 2016-05-19 ENCOUNTER — Ambulatory Visit (INDEPENDENT_AMBULATORY_CARE_PROVIDER_SITE_OTHER): Payer: Medicare Other

## 2016-05-19 ENCOUNTER — Ambulatory Visit (INDEPENDENT_AMBULATORY_CARE_PROVIDER_SITE_OTHER): Payer: Medicare Other | Admitting: Emergency Medicine

## 2016-05-19 VITALS — BP 124/80 | HR 79 | Temp 98.2°F | Resp 17 | Ht 69.0 in | Wt 240.0 lb

## 2016-05-19 DIAGNOSIS — R635 Abnormal weight gain: Secondary | ICD-10-CM | POA: Diagnosis not present

## 2016-05-19 DIAGNOSIS — R1032 Left lower quadrant pain: Secondary | ICD-10-CM | POA: Diagnosis not present

## 2016-05-19 DIAGNOSIS — K429 Umbilical hernia without obstruction or gangrene: Secondary | ICD-10-CM | POA: Diagnosis not present

## 2016-05-19 DIAGNOSIS — M5442 Lumbago with sciatica, left side: Secondary | ICD-10-CM | POA: Diagnosis not present

## 2016-05-19 DIAGNOSIS — M5441 Lumbago with sciatica, right side: Secondary | ICD-10-CM | POA: Diagnosis not present

## 2016-05-19 DIAGNOSIS — N200 Calculus of kidney: Secondary | ICD-10-CM | POA: Diagnosis not present

## 2016-05-19 LAB — COMPLETE METABOLIC PANEL WITH GFR
ALT: 23 U/L (ref 9–46)
AST: 21 U/L (ref 10–35)
Albumin: 4.1 g/dL (ref 3.6–5.1)
Alkaline Phosphatase: 86 U/L (ref 40–115)
BUN: 13 mg/dL (ref 7–25)
CALCIUM: 9.1 mg/dL (ref 8.6–10.3)
CHLORIDE: 99 mmol/L (ref 98–110)
CO2: 26 mmol/L (ref 20–31)
CREATININE: 1.51 mg/dL — AB (ref 0.70–1.18)
GFR, EST AFRICAN AMERICAN: 51 mL/min — AB (ref >=60)
GFR, EST NON AFRICAN AMERICAN: 45 mL/min — AB (ref >=60)
Glucose, Bld: 88 mg/dL (ref 65–99)
Potassium: 4.1 mmol/L (ref 3.5–5.3)
Sodium: 137 mmol/L (ref 135–146)
Total Bilirubin: 0.9 mg/dL (ref 0.2–1.2)
Total Protein: 7.1 g/dL (ref 6.1–8.1)

## 2016-05-19 LAB — POCT CBC
GRANULOCYTE PERCENT: 74.4 % (ref 37–80)
HCT, POC: 47.1 % (ref 43.5–53.7)
Hemoglobin: 16.7 g/dL (ref 14.1–18.1)
LYMPH, POC: 1.4 (ref 0.6–3.4)
MCH, POC: 32.3 pg — AB (ref 27–31.2)
MCHC: 35.6 g/dL — AB (ref 31.8–35.4)
MCV: 90.9 fL (ref 80–97)
MID (CBC): 0.2 (ref 0–0.9)
MPV: 7.3 fL (ref 0–99.8)
PLATELET COUNT, POC: 184 10*3/uL (ref 142–424)
POC Granulocyte: 4.8 (ref 2–6.9)
POC LYMPH %: 21.9 % (ref 10–50)
POC MID %: 3.7 %M (ref 0–12)
RBC: 5.18 M/uL (ref 4.69–6.13)
RDW, POC: 13.3 %
WBC: 6.5 10*3/uL (ref 4.6–10.2)

## 2016-05-19 LAB — POCT URINALYSIS DIP (MANUAL ENTRY)
BILIRUBIN UA: NEGATIVE
BILIRUBIN UA: NEGATIVE
Glucose, UA: NEGATIVE
LEUKOCYTES UA: NEGATIVE
Nitrite, UA: NEGATIVE
PH UA: 6
PROTEIN UA: NEGATIVE
RBC UA: NEGATIVE
SPEC GRAV UA: 1.01
Urobilinogen, UA: 0.2

## 2016-05-19 LAB — POC MICROSCOPIC URINALYSIS (UMFC): MUCUS RE: ABSENT

## 2016-05-19 MED ORDER — HYDROCODONE-ACETAMINOPHEN 5-325 MG PO TABS: ORAL_TABLET | ORAL | Status: DC

## 2016-05-19 NOTE — Progress Notes (Addendum)
By signing my name below, I, [name], attest that this documentation has been prepared under the direction and in the presence of [name] Queen, MD.  Electronically Signed: Thea Hanson, ED Scribe. 05/19/2016. 11:40 AM.  Chief Complaint:  Chief Complaint  Patient presents with  . Abdominal Pain    LLQ    HPI: [name] is a 75 y.o. male who reports to [facility] today complaining of left lower abdominal pain that began 5 days ago. Pt states while taking off his socks 5 days ago, he felt a sharp pain in left lower abdomen. He reports pain became worse last night finding himself doubling over with sitting and standing but pain subsided. He reports associated abdominal distention. He reports normal BM. He has hx of hernia.  Pt was seen by Dr. Benson Norway 1 year ago for abdominal distention.   Past Medical History  Diagnosis Date  . 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 Sleep apnea   . Hx of [facility] spotted fever childhood  . Allergy     takes Zyrtec daily  . Neuromuscular disorder ([place])   . Arthritis   . Back pain   . Dyspnea on exertion   . Hyperlipidemia   . Depression     takes Wellbutrin daily  . GERD (gastroesophageal reflux disease)     takes Nexium and Omeprazole daily  . Anxiety     takes Ativan daily as needed  . Hypertension   . History of kidney stones   . Umbilical hernia    Past Surgical History  Procedure Laterality Date  . Epidural steroid injections      every 6 months   Dr. Arlice Colt  . Hernia repair  2003  . Back surgery  2003    L4, L5  . Endovenous ablation saphenous vein w/ laser Right 06-01-2014    EVLA right greater saphenous vein by Curt Jews MD   . Back surgery    . Vascular surgery    . Hernia repair      hernia inguinal  . 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 Marital Status: Single    Spouse Name: N/A  . Number of Children: N/A  . Years of Education: N/A   Occupational History  . Antiques/Real Estate    Social History Main Topics  . Smoking status: Never Smoker   . Smokeless tobacco: Never Used  . Alcohol Use: No  . Drug Use: No  . Sexual Activity: Yes    Birth Control/ Protection: Condom   Other Topics Concern  . Not on file   Social History Narrative   ** Merged History Encounter **       Single. Education: Other.    Family History  Problem Relation Age of Onset  . Thyroid disease Mother   . Heart disease Father   . Diabetes Maternal Grandfather    No Known Allergies Prior to Admission medications   Medication Sig Start Date End Date Taking? Authorizing Provider  buPROPion (WELLBUTRIN XL) 300 MG 24 hr tablet TAKE 1 TABLET BY MOUTH DAILY *NEEDS OFFICE VISIT 03/11/16  Yes [name], MD  calcium carbonate 200 MG capsule Take 200 mg by mouth daily.    Yes Historical Provider, MD  cetirizine (ZYRTEC) 10 MG tablet Take 10 mg by mouth daily.    Yes Historical Provider, MD  doxazosin (CARDURA) 8 MG tablet TAKE 1 TABLET BY MOUTH DAILY  . Take at night 03/11/16  Yes [name], MD  esomeprazole (NEXIUM) 20 MG capsule Take 1 capsule (20 mg total) by mouth daily. 03/11/16  Yes [name], MD  flurazepam [facility]) 15 MG capsule Take 1 capsule (15 mg total) by mouth at bedtime as needed for sleep. 03/11/16  Yes [name], MD  fluticasone (FLONASE) 50 MCG/ACT nasal spray Place 2 sprays into both nostrils daily. 03/11/16  Yes [name], MD  furosemide (LASIX) 40 MG tablet Take 1 tablet (40 mg total) by mouth daily. 03/11/16  Yes [name], MD  gabapentin (NEURONTIN) 300 MG capsule Take 1-2 capsules at bedtime 03/11/16  Yes [name], MD  glucosamine-chondroitin 500-400 MG tablet Take 1 tablet by mouth 2  (two) times daily.   Yes Historical Provider, MD  HYDROcodone-acetaminophen (NORCO) 5-325 MG tablet 1 tab every 8 hours 03/11/16  Yes [name], MD  LORazepam (ATIVAN) 0.5 MG tablet Take 1 tablet (0.5 mg total) by mouth 2 (two) times daily as needed for anxiety. 03/11/16  Yes [name], MD  meloxicam (MOBIC) 7.5 MG tablet TAKE 1-2 TABLETS BY MOUTH DAILY 03/11/16  Yes [name], MD  methocarbamol (ROBAXIN) 500 MG tablet Take 1 tablet (500 mg total) by mouth every 6 (six) hours as needed for muscle spasms. 03/11/16  Yes [name], MD  Multiple Vitamin (MULTIVITAMIN) tablet Take 1 tablet by mouth daily.   Yes Historical Provider, MD  NEEDLE, DISP, 21 G 21G X 1-1/2" MISC Use to inject depotestosterone. 06/11/15  Yes [name] Jeffery, PA-C  potassium chloride SA (K-DUR,KLOR-CON) 20 MEQ tablet Take 2 tablets (40 mEq total) by mouth daily. 03/11/16  Yes [name], MD  Syringe, Reusable, 3 ML MISC Use to inject depotestosterone. 06/11/15  Yes [name] Jeffery, PA-C  testosterone cypionate (DEPOTESTOTERONE CYPIONATE) 100 MG/ML injection Inject 1 mL (100 mg total) into the muscle every 7 (seven) days. For IM use only 01/07/16  Yes [name] Koyanagi, MD  sildenafil (REVATIO) 20 MG tablet Take 5 tablets daily as needed for erectile dysfunction. Patient not taking: Reported on 11/10/2015 04/13/14   [name], MD     ROS: The patient denies fevers, chills, night sweats, unintentional weight loss, chest pain, palpitations, wheezing, dyspnea on exertion, nausea, vomiting, dysuria, hematuria, melena, numbness, weakness, or tingling.   All other systems have been reviewed and were otherwise negative with the exception of those mentioned in the HPI and as above.    PHYSICAL EXAM: Filed Vitals:   05/19/16 1112  BP: 124/80  Pulse: 79  Temp: 98.2 F (36.8 C)  Resp: 17   Body mass index is 35.43 kg/(m^2).   General: Alert, no acute distress HEENT:  Normocephalic, atraumatic, oropharynx patent. Eye:  [name] Cardiovascular:  Regular rate and rhythm, no rubs murmurs or gallops.  No Carotid bruits, radial pulse intact. No pedal edema.  Respiratory: Clear to auscultation bilaterally.  No wheezes, rales, or rhonchi.  No cyanosis, no use of accessory musculature Abdominal: No organomegaly, abdomen is soft and non-tender, positive bowel sounds.  No masses. Musculoskeletal: Gait intact. No edema, tenderness Skin: No rashes. Neurologic: Facial musculature symmetric. Psychiatric: Patient  acts appropriately throughout our interaction. Lymphatic: No cervical or submandibular lymphadenopathy  LABS: Results for orders placed or performed in visit on 05/19/16  POCT CBC  Result Value Ref Range   WBC 6.5 4.6 - 10.2 K/uL   Lymph, poc 1.4 0.6 - 3.4   POC LYMPH PERCENT 21.9 10 - 50 %L   MID (cbc) 0.2 0 - 0.9   POC MID % 3.7 0 - 12 %M   POC Granulocyte 4.8 2 - 6.9   Granulocyte percent 74.4 37 - 80 %G   RBC 5.18 4.69 - 6.13 M/uL   Hemoglobin 16.7 14.1 - 18.1 g/dL   HCT, POC 47.1 43.5 - 53.7 %   MCV 90.9 80 - 97 fL   MCH, POC 32.3 (A) 27 - 31.2 pg   MCHC 35.6 (A) 31.8 - 35.4 g/dL   RDW, POC 13.3 %   Platelet Count, POC 184 142 - 424 K/uL   MPV 7.3 0 - 99.8 fL  POCT Microscopic Urinalysis (UMFC)  Result Value Ref Range   WBC,UR,HPF,POC None None WBC/hpf   RBC,UR,HPF,POC None None RBC/hpf   Bacteria Few (A) None, Too numerous to count   Mucus Absent Absent   Epithelial Cells, UR Per Microscopy None None, Too numerous to count cells/hpf  POCT urinalysis dipstick  Result Value Ref Range   Color, UA yellow yellow   Clarity, UA clear clear   Glucose, UA negative negative   Bilirubin, UA negative negative   Ketones, POC UA negative negative   Spec Grav, UA 1.010    Blood, UA negative negative   pH, UA 6.0    Protein Ur, POC negative negative   Urobilinogen, UA 0.2    Nitrite, UA Negative Negative   Leukocytes, UA Negative Negative   EKG/XRAY:   Primary read interpreted by Dr. Everlene Farrier  at [facility]. Dg Abd Acute W/chest  05/19/2016  CLINICAL DATA:  Onset of sharp left lower abdominal pain 5 days ago while bending over. Some abdominal distension. Known umbilical hernia, bilateral kidney stones EXAM: DG ABDOMEN ACUTE W/ 1V CHEST COMPARISON:  Noncontrast abdominal and pelvic CT scan of April 21, 2016 and KUB images of April 16, 2015. FINDINGS: The lungs are mildly hypoinflated. The interstitial markings are coarse. The heart is top-normal in size. The pulmonary vascularity is not engorged. There is no pleural effusion. There is degenerative disc disease of the thoracic spine. There is gentle S shaped thoracolumbar scoliosis. Within the abdomen multiple subcentimeter kidney stones are present bilaterally. There phleboliths within the pelvis. No definite ureteral stones are observed. The bowel gas pattern is unremarkable. IMPRESSION: 1. Multiple bilateral kidney stones without definite ureteral or bladder stones. No acute bowel abnormality. 2. Coarse lung markings bilaterally. No alveolar pneumonia nor pulmonary edema. 3. Gentle S shaped thoracolumbar scoliosis with multilevel degenerative disc disease. Electronically Signed   By: David  Martinique M.D.   On: 05/19/2016 12:30   ASSESSMENT/PLAN: Patient does have a fairly large umbilical hernia. This is easily reducible. Films done showed no acute abnormalities. He does have bilateral kidney stones. He had a noncontrast CT recently which did not show any major abnormalities left lower abdomen. I have requested copies of his office note from when he saw Dr. Benson Norway last year. I could not find a copy of his last colonoscopy but will try and also obtain this. Referral made back to Dr. Benson Norway for his help.I personally performed the services described in this documentation, which was scribed in my presence. The recorded information  has been reviewed and is accurate. I did make a referral to general surgery to talk with them about repair of his umbilical hernia. Also  made referral to a nutritionist to help with his weight issue.I personally performed the services described in this documentation, which was scribed in my presence. The recorded information has been reviewed and is accurate.   Gross sideeffects, risk and benefits, and alternatives of medications d/w patient. Patient is aware that all medications have potential sideeffects and we are unable to predict every sideeffect or drug-drug interaction that may occur.  [name] Queen MD 05/19/2016 11:40 AM

## 2016-05-19 NOTE — Patient Instructions (Signed)
     IF you received an x-ray today, you will receive an invoice from [facility]. Please contact [facility] at [phone] with questions or concerns regarding your invoice.   IF you received labwork today, you will receive an invoice from [facility]. Please contact [facility] at [phone] with questions or concerns regarding your invoice.   Our billing staff will not be able to assist you with questions regarding bills from these companies.  You will be contacted with the lab results as soon as they are available. The fastest way to get your results is to activate your My Chart account. Instructions are located on the last page of this paperwork. If you have not heard from us regarding the results in 2 weeks, please contact this office.      

## 2016-05-20 DIAGNOSIS — R3121 Asymptomatic microscopic hematuria: Secondary | ICD-10-CM | POA: Diagnosis not present

## 2016-05-20 DIAGNOSIS — R350 Frequency of micturition: Secondary | ICD-10-CM | POA: Diagnosis not present

## 2016-05-26 ENCOUNTER — Other Ambulatory Visit: Payer: Self-pay | Admitting: Emergency Medicine

## 2016-05-26 ENCOUNTER — Telehealth: Payer: Self-pay

## 2016-05-26 DIAGNOSIS — R1084 Generalized abdominal pain: Secondary | ICD-10-CM

## 2016-05-26 NOTE — Telephone Encounter (Signed)
Please let patient know I sent in a referral to Dr. Benson Norway to evaluate him for colonoscopy.

## 2016-05-26 NOTE — Telephone Encounter (Signed)
Dr. Everlene Farrier, I called the [facility] in regards to patient's colonoscopy. Per the center, pt. Had an endoscopy last year in June. The center stated they would be happy to do one for him, all they would need is a referral since he is an established patient.

## 2016-05-27 NOTE — Telephone Encounter (Signed)
[name] I think this was done already.

## 2016-05-28 NOTE — Telephone Encounter (Signed)
The referral was sent on 05/21/16 to [facility] requesting Dr Benson Norway.

## 2016-05-29 ENCOUNTER — Other Ambulatory Visit: Payer: Self-pay | Admitting: Emergency Medicine

## 2016-05-29 ENCOUNTER — Telehealth: Payer: Self-pay

## 2016-05-29 DIAGNOSIS — R109 Unspecified abdominal pain: Secondary | ICD-10-CM

## 2016-05-29 NOTE — Telephone Encounter (Signed)
SPoke w/pt re:  Colonoscopy last done with [facility] 2004.  Pt had Air Contrast Barium Enema 2010 with Kaylyn Lim, MD.   Pt has referral placed to Dr Benson Norway for Colonoscopy ASAP.  Has appointment with [facility] 06/06/2016.

## 2016-06-05 DIAGNOSIS — K429 Umbilical hernia without obstruction or gangrene: Secondary | ICD-10-CM | POA: Diagnosis not present

## 2016-06-17 DIAGNOSIS — M79672 Pain in left foot: Secondary | ICD-10-CM | POA: Diagnosis not present

## 2016-06-23 ENCOUNTER — Ambulatory Visit: Payer: Self-pay | Admitting: Surgery

## 2016-06-23 DIAGNOSIS — R140 Abdominal distension (gaseous): Secondary | ICD-10-CM | POA: Diagnosis not present

## 2016-06-23 DIAGNOSIS — R1032 Left lower quadrant pain: Secondary | ICD-10-CM | POA: Diagnosis not present

## 2016-06-23 DIAGNOSIS — K429 Umbilical hernia without obstruction or gangrene: Secondary | ICD-10-CM | POA: Diagnosis not present

## 2016-06-23 DIAGNOSIS — Z1211 Encounter for screening for malignant neoplasm of colon: Secondary | ICD-10-CM | POA: Diagnosis not present

## 2016-06-23 NOTE — Patient Instructions (Addendum)
[name]  06/23/2016   Your procedure is scheduled on: 06-27-16  Report to [facility]  Entrance take [facility]  elevators to 3rd floor to  [name] at 530  AM.  Call this number if you have problems the morning of surgery [phone]   Remember: ONLY 1 PERSON MAY GO WITH YOU TO SHORT STAY TO GET  READY MORNING OF [name].  Do not eat food or drink liquids :After Midnight.  BRING CPAP MASK AND TUBING   Take these medicines the morning of surgery with A SIP OF WATER: ZRYTEC, BUPROPION, NEXIUM, FLONASE NASAL SPRAY, HYDROCODNE IF NEEDED, LORAZEPAM IF NEEDED                               You may not have any metal on your body including hair pins and              piercings  Do not wear jewelry, make-up, lotions, powders or perfumes, deodorant             Do not wear nail polish.  Do not shave  48 hours prior to surgery.              Men may shave face and neck.   Do not bring valuables to the hospital. [name].  Contacts, dentures or bridgework may not be worn into surgery.  Leave suitcase in the car. After surgery it may be brought to your room.                  Please read over the following fact sheets you were given: _____________________________________________________________________             [facility] - Preparing for Surgery Before surgery, you can play an important role.  Because skin is not sterile, your skin needs to be as free of germs as possible.  You can reduce the number of germs on your skin by washing with CHG (chlorahexidine gluconate) soap before surgery.  CHG is an antiseptic cleaner which kills germs and bonds with the skin to continue killing germs even after washing. Please DO NOT use if you have an allergy to CHG or antibacterial soaps.  If your skin becomes reddened/irritated stop using the CHG and inform your nurse when you arrive at Short Stay. Do not shave  (including legs and underarms) for at least 48 hours prior to the first CHG shower.  You may shave your face/neck. Please follow these instructions carefully:  1.  Shower with CHG Soap the night before surgery and the  morning of Surgery.  2.  If you choose to wash your hair, wash your hair first as usual with your  normal  shampoo.  3.  After you shampoo, rinse your hair and body thoroughly to remove the  shampoo.                           4.  Use CHG as you would any other liquid soap.  You can apply chg directly  to the skin and wash  Gently with a scrungie or clean washcloth.  5.  Apply the CHG Soap to your body ONLY FROM THE NECK DOWN.   Do not use on face/ open                           Wound or open sores. Avoid contact with eyes, ears mouth and genitals (private parts).                       Wash face,  Genitals (private parts) with your normal soap.             6.  Wash thoroughly, paying special attention to the area where your surgery  will be performed.  7.  Thoroughly rinse your body with warm water from the neck down.  8.  DO NOT shower/wash with your normal soap after using and rinsing off  the CHG Soap.                9.  Pat yourself dry with a clean towel.            10.  Wear clean pajamas.            11.  Place clean sheets on your bed the night of your first shower and do not  sleep with pets. Day of Surgery : Do not apply any lotions/deodorants the morning of surgery.  Please wear clean clothes to the hospital/surgery center.  FAILURE TO FOLLOW THESE INSTRUCTIONS MAY RESULT IN THE CANCELLATION OF YOUR SURGERY PATIENT SIGNATURE_________________________________  NURSE SIGNATURE__________________________________  ________________________________________________________________________

## 2016-06-24 ENCOUNTER — Encounter (HOSPITAL_COMMUNITY): Payer: Self-pay

## 2016-06-24 ENCOUNTER — Encounter (HOSPITAL_COMMUNITY)
Admission: RE | Admit: 2016-06-24 | Discharge: 2016-06-24 | Disposition: A | Discharge status: Home / Self Care | Payer: Medicare Other | Source: Ambulatory Visit | Attending: Surgery | Admitting: Surgery

## 2016-06-24 DIAGNOSIS — Z9989 Dependence on other enabling machines and devices: Secondary | ICD-10-CM | POA: Diagnosis not present

## 2016-06-24 DIAGNOSIS — F419 Anxiety disorder, unspecified: Secondary | ICD-10-CM | POA: Diagnosis not present

## 2016-06-24 DIAGNOSIS — Z96652 Presence of left artificial knee joint: Secondary | ICD-10-CM | POA: Diagnosis not present

## 2016-06-24 DIAGNOSIS — G4733 Obstructive sleep apnea (adult) (pediatric): Secondary | ICD-10-CM | POA: Diagnosis not present

## 2016-06-24 DIAGNOSIS — M1990 Unspecified osteoarthritis, unspecified site: Secondary | ICD-10-CM | POA: Diagnosis not present

## 2016-06-24 DIAGNOSIS — K429 Umbilical hernia without obstruction or gangrene: Secondary | ICD-10-CM | POA: Diagnosis not present

## 2016-06-24 DIAGNOSIS — F329 Major depressive disorder, single episode, unspecified: Secondary | ICD-10-CM | POA: Diagnosis not present

## 2016-06-24 DIAGNOSIS — N400 Enlarged prostate without lower urinary tract symptoms: Secondary | ICD-10-CM | POA: Diagnosis not present

## 2016-06-24 DIAGNOSIS — Z23 Encounter for immunization: Secondary | ICD-10-CM | POA: Diagnosis not present

## 2016-06-24 DIAGNOSIS — K219 Gastro-esophageal reflux disease without esophagitis: Secondary | ICD-10-CM | POA: Diagnosis not present

## 2016-06-24 DIAGNOSIS — Z89029 Acquired absence of unspecified finger(s): Secondary | ICD-10-CM | POA: Diagnosis not present

## 2016-06-24 HISTORY — DX: Benign prostatic hyperplasia without lower urinary tract symptoms: N40.0

## 2016-06-24 HISTORY — DX: Other specified soft tissue disorders: M79.89

## 2016-06-24 LAB — BASIC METABOLIC PANEL
Anion gap: 8 (ref 5–15)
BUN: 15 mg/dL (ref 6–20)
CHLORIDE: 101 mmol/L (ref 101–111)
CO2: 28 mmol/L (ref 22–32)
Calcium: 8.8 mg/dL — ABNORMAL LOW (ref 8.9–10.3)
Creatinine, Ser: 1.57 mg/dL — ABNORMAL HIGH (ref 0.61–1.24)
GFR, EST AFRICAN AMERICAN: 48 mL/min — AB (ref >60)
GFR, EST NON AFRICAN AMERICAN: 41 mL/min — AB (ref >60)
Glucose, Bld: 104 mg/dL — ABNORMAL HIGH (ref 65–99)
POTASSIUM: 3.9 mmol/L (ref 3.5–5.1)
Sodium: 137 mmol/L (ref 135–145)

## 2016-06-24 LAB — CBC
HEMATOCRIT: 46.6 % (ref 39.0–52.0)
HEMOGLOBIN: 16.4 g/dL (ref 13.0–17.0)
MCH: 31.5 pg (ref 26.0–34.0)
MCHC: 35.2 g/dL (ref 30.0–36.0)
MCV: 89.4 fL (ref 78.0–100.0)
Platelets: 178 10*3/uL (ref 150–400)
RBC: 5.21 MIL/uL (ref 4.22–5.81)
RDW: 13.1 % (ref 11.5–15.5)
WBC: 7.3 10*3/uL (ref 4.0–10.5)

## 2016-06-26 NOTE — Progress Notes (Signed)
Called and left message on [phone] with time change for surgery on 06/27/16.  Patient to arrive at 0900am surgery at 1100am.  Patient instructed to call back so we know message received.

## 2016-06-27 ENCOUNTER — Encounter (HOSPITAL_COMMUNITY): Payer: Self-pay | Admitting: Registered Nurse

## 2016-06-27 ENCOUNTER — Observation Stay (HOSPITAL_COMMUNITY)
Admission: RE | Admit: 2016-06-27 | Discharge: 2016-06-28 | Disposition: A | Discharge status: Home / Self Care | Payer: Medicare Other | Source: Ambulatory Visit | Attending: Surgery | Admitting: Surgery

## 2016-06-27 ENCOUNTER — Ambulatory Visit (HOSPITAL_COMMUNITY): Payer: Medicare Other | Admitting: Registered Nurse

## 2016-06-27 ENCOUNTER — Encounter (HOSPITAL_COMMUNITY)
Admission: RE | Disposition: A | Discharge status: Home / Self Care | Payer: Self-pay | Source: Ambulatory Visit | Attending: Surgery

## 2016-06-27 DIAGNOSIS — G4733 Obstructive sleep apnea (adult) (pediatric): Secondary | ICD-10-CM | POA: Diagnosis not present

## 2016-06-27 DIAGNOSIS — F329 Major depressive disorder, single episode, unspecified: Secondary | ICD-10-CM | POA: Diagnosis not present

## 2016-06-27 DIAGNOSIS — K219 Gastro-esophageal reflux disease without esophagitis: Secondary | ICD-10-CM | POA: Insufficient documentation

## 2016-06-27 DIAGNOSIS — K429 Umbilical hernia without obstruction or gangrene: Principal | ICD-10-CM | POA: Insufficient documentation

## 2016-06-27 DIAGNOSIS — M1990 Unspecified osteoarthritis, unspecified site: Secondary | ICD-10-CM | POA: Diagnosis not present

## 2016-06-27 DIAGNOSIS — F419 Anxiety disorder, unspecified: Secondary | ICD-10-CM | POA: Diagnosis not present

## 2016-06-27 DIAGNOSIS — Z9989 Dependence on other enabling machines and devices: Secondary | ICD-10-CM | POA: Insufficient documentation

## 2016-06-27 DIAGNOSIS — N400 Enlarged prostate without lower urinary tract symptoms: Secondary | ICD-10-CM | POA: Diagnosis not present

## 2016-06-27 DIAGNOSIS — Z96652 Presence of left artificial knee joint: Secondary | ICD-10-CM | POA: Insufficient documentation

## 2016-06-27 DIAGNOSIS — Z89029 Acquired absence of unspecified finger(s): Secondary | ICD-10-CM | POA: Insufficient documentation

## 2016-06-27 DIAGNOSIS — M5441 Lumbago with sciatica, right side: Secondary | ICD-10-CM

## 2016-06-27 DIAGNOSIS — Z23 Encounter for immunization: Secondary | ICD-10-CM | POA: Insufficient documentation

## 2016-06-27 DIAGNOSIS — N189 Chronic kidney disease, unspecified: Secondary | ICD-10-CM | POA: Diagnosis not present

## 2016-06-27 DIAGNOSIS — M5442 Lumbago with sciatica, left side: Secondary | ICD-10-CM

## 2016-06-27 HISTORY — PX: UMBILICAL HERNIA REPAIR: SHX196

## 2016-06-27 SURGERY — REPAIR, HERNIA, UMBILICAL, LAPAROSCOPIC
Anesthesia: General | Site: Abdomen

## 2016-06-27 MED ORDER — CEFAZOLIN SODIUM-DEXTROSE 2-4 GM/100ML-% IV SOLN
INTRAVENOUS | Status: AC
  Filled 2016-06-27: qty 100

## 2016-06-27 MED ORDER — FENTANYL CITRATE (PF) 100 MCG/2ML IJ SOLN
INTRAMUSCULAR | Status: DC | PRN
  Administered 2016-06-27 (×3): 50 ug via INTRAVENOUS

## 2016-06-27 MED ORDER — FENTANYL CITRATE (PF) 100 MCG/2ML IJ SOLN
INTRAMUSCULAR | Status: AC
  Filled 2016-06-27: qty 4

## 2016-06-27 MED ORDER — FLUTICASONE PROPIONATE 50 MCG/ACT NA SUSP
2.0000 | Freq: Every day | NASAL | Status: DC
  Administered 2016-06-28: 2 via NASAL
  Filled 2016-06-27: qty 16

## 2016-06-27 MED ORDER — CEFAZOLIN SODIUM-DEXTROSE 2-4 GM/100ML-% IV SOLN
2.0000 g | INTRAVENOUS | Status: AC
  Administered 2016-06-27: 2 g via INTRAVENOUS
  Filled 2016-06-27: qty 100

## 2016-06-27 MED ORDER — ONDANSETRON HCL 4 MG/2ML IJ SOLN
INTRAMUSCULAR | Status: AC
  Filled 2016-06-27: qty 2

## 2016-06-27 MED ORDER — CHLORHEXIDINE GLUCONATE CLOTH 2 % EX PADS: 6.0000 | MEDICATED_PAD | Freq: Once | CUTANEOUS | Status: DC

## 2016-06-27 MED ORDER — HEPARIN SODIUM (PORCINE) 5000 UNIT/ML IJ SOLN
5000.0000 [IU] | Freq: Three times a day (TID) | INTRAMUSCULAR | Status: DC
  Administered 2016-06-27 – 2016-06-28 (×3): 5000 [IU] via SUBCUTANEOUS
  Filled 2016-06-27 (×3): qty 1

## 2016-06-27 MED ORDER — SUGAMMADEX SODIUM 200 MG/2ML IV SOLN
INTRAVENOUS | Status: AC
  Filled 2016-06-27: qty 2

## 2016-06-27 MED ORDER — SUCCINYLCHOLINE CHLORIDE 20 MG/ML IJ SOLN
INTRAMUSCULAR | Status: DC | PRN
  Administered 2016-06-27: 100 mg via INTRAVENOUS

## 2016-06-27 MED ORDER — LORATADINE 10 MG PO TABS
10.0000 mg | ORAL_TABLET | Freq: Every day | ORAL | Status: DC
  Administered 2016-06-28: 10 mg via ORAL
  Filled 2016-06-27: qty 1

## 2016-06-27 MED ORDER — SUGAMMADEX SODIUM 200 MG/2ML IV SOLN
INTRAVENOUS | Status: DC | PRN
  Administered 2016-06-27: 200 mg via INTRAVENOUS

## 2016-06-27 MED ORDER — CALCIUM CARBONATE 200 MG PO CAPS: 200.0000 mg | ORAL_CAPSULE | Freq: Every day | ORAL | Status: DC

## 2016-06-27 MED ORDER — LIDOCAINE HCL (CARDIAC) 20 MG/ML IV SOLN
INTRAVENOUS | Status: AC
  Filled 2016-06-27: qty 5

## 2016-06-27 MED ORDER — HYDROCODONE-ACETAMINOPHEN 5-325 MG PO TABS
1.0000 | ORAL_TABLET | ORAL | Status: DC | PRN
  Administered 2016-06-28: 1 via ORAL
  Filled 2016-06-27: qty 1

## 2016-06-27 MED ORDER — BUPROPION HCL ER (XL) 150 MG PO TB24
150.0000 mg | ORAL_TABLET | Freq: Every day | ORAL | Status: DC
  Administered 2016-06-28: 150 mg via ORAL
  Filled 2016-06-27: qty 1

## 2016-06-27 MED ORDER — ONDANSETRON HCL 4 MG/2ML IJ SOLN: 4.0000 mg | Freq: Four times a day (QID) | INTRAMUSCULAR | Status: DC | PRN

## 2016-06-27 MED ORDER — GABAPENTIN 300 MG PO CAPS
300.0000 mg | ORAL_CAPSULE | Freq: Every day | ORAL | Status: DC
  Administered 2016-06-27: 600 mg via ORAL
  Filled 2016-06-27: qty 2

## 2016-06-27 MED ORDER — PROPOFOL 10 MG/ML IV BOLUS
INTRAVENOUS | Status: AC
  Filled 2016-06-27: qty 20

## 2016-06-27 MED ORDER — LORAZEPAM 0.5 MG PO TABS: 0.5000 mg | ORAL_TABLET | Freq: Two times a day (BID) | ORAL | Status: DC | PRN

## 2016-06-27 MED ORDER — ROCURONIUM BROMIDE 100 MG/10ML IV SOLN
INTRAVENOUS | Status: AC
  Filled 2016-06-27: qty 1

## 2016-06-27 MED ORDER — PNEUMOCOCCAL VAC POLYVALENT 25 MCG/0.5ML IJ INJ
0.5000 mL | INJECTION | INTRAMUSCULAR | Status: AC
  Administered 2016-06-28: 0.5 mL via INTRAMUSCULAR
  Filled 2016-06-27 (×2): qty 0.5

## 2016-06-27 MED ORDER — ADULT MULTIVITAMIN W/MINERALS CH
1.0000 | ORAL_TABLET | Freq: Every day | ORAL | Status: DC
  Administered 2016-06-28: 1 via ORAL
  Filled 2016-06-27 (×2): qty 1

## 2016-06-27 MED ORDER — KCL IN DEXTROSE-NACL 20-5-0.45 MEQ/L-%-% IV SOLN
INTRAVENOUS | Status: DC
  Administered 2016-06-27: 100 mL/h via INTRAVENOUS
  Administered 2016-06-28: 04:00:00 via INTRAVENOUS
  Filled 2016-06-27 (×3): qty 1000

## 2016-06-27 MED ORDER — CALCIUM CARBONATE ANTACID 500 MG PO CHEW
1.0000 | CHEWABLE_TABLET | Freq: Every evening | ORAL | Status: DC
  Filled 2016-06-27 (×2): qty 1

## 2016-06-27 MED ORDER — TEMAZEPAM 15 MG PO CAPS: 15.0000 mg | ORAL_CAPSULE | Freq: Every evening | ORAL | Status: DC | PRN

## 2016-06-27 MED ORDER — METHOCARBAMOL 500 MG PO TABS
500.0000 mg | ORAL_TABLET | Freq: Four times a day (QID) | ORAL | Status: DC | PRN
  Administered 2016-06-28: 500 mg via ORAL
  Filled 2016-06-27: qty 1

## 2016-06-27 MED ORDER — GLUCOSAMINE-CHONDROITIN 500-400 MG PO TABS: 1.0000 | ORAL_TABLET | Freq: Two times a day (BID) | ORAL | Status: DC

## 2016-06-27 MED ORDER — ONDANSETRON 4 MG PO TBDP: 4.0000 mg | ORAL_TABLET | Freq: Four times a day (QID) | ORAL | Status: DC | PRN

## 2016-06-27 MED ORDER — PANTOPRAZOLE SODIUM 40 MG PO TBEC
40.0000 mg | DELAYED_RELEASE_TABLET | Freq: Every day | ORAL | Status: DC
  Administered 2016-06-28: 40 mg via ORAL
  Filled 2016-06-27: qty 1

## 2016-06-27 MED ORDER — FUROSEMIDE 20 MG PO TABS
40.0000 mg | ORAL_TABLET | Freq: Every day | ORAL | Status: DC
  Administered 2016-06-28: 40 mg via ORAL
  Filled 2016-06-27 (×2): qty 2

## 2016-06-27 MED ORDER — HYDROMORPHONE HCL 1 MG/ML IJ SOLN: 0.2500 mg | INTRAMUSCULAR | Status: DC | PRN

## 2016-06-27 MED ORDER — HEPARIN SODIUM (PORCINE) 5000 UNIT/ML IJ SOLN
5000.0000 [IU] | Freq: Once | INTRAMUSCULAR | Status: AC
  Administered 2016-06-27: 5000 [IU] via SUBCUTANEOUS
  Filled 2016-06-27: qty 1

## 2016-06-27 MED ORDER — 0.9 % SODIUM CHLORIDE (POUR BTL) OPTIME
TOPICAL | Status: DC | PRN
  Administered 2016-06-27: 1000 mL

## 2016-06-27 MED ORDER — ONDANSETRON HCL 4 MG/2ML IJ SOLN
INTRAMUSCULAR | Status: DC | PRN
  Administered 2016-06-27: 4 mg via INTRAVENOUS

## 2016-06-27 MED ORDER — LIDOCAINE HCL (CARDIAC) 20 MG/ML IV SOLN
INTRAVENOUS | Status: DC | PRN
  Administered 2016-06-27: 80 mg via INTRAVENOUS

## 2016-06-27 MED ORDER — ROCURONIUM BROMIDE 100 MG/10ML IV SOLN
INTRAVENOUS | Status: DC | PRN
  Administered 2016-06-27: 10 mg via INTRAVENOUS
  Administered 2016-06-27: 20 mg via INTRAVENOUS

## 2016-06-27 MED ORDER — EPHEDRINE SULFATE 50 MG/ML IJ SOLN
INTRAMUSCULAR | Status: DC | PRN
  Administered 2016-06-27: 5 mg via INTRAVENOUS

## 2016-06-27 MED ORDER — BUPIVACAINE LIPOSOME 1.3 % IJ SUSP
20.0000 mL | Freq: Once | INTRAMUSCULAR | Status: AC
  Administered 2016-06-27: 20 mL
  Filled 2016-06-27: qty 20

## 2016-06-27 MED ORDER — PROPOFOL 10 MG/ML IV BOLUS
INTRAVENOUS | Status: DC | PRN
  Administered 2016-06-27: 150 mg via INTRAVENOUS

## 2016-06-27 MED ORDER — LACTATED RINGERS IV SOLN
INTRAVENOUS | Status: DC
  Administered 2016-06-27: 1000 mL via INTRAVENOUS

## 2016-06-27 MED ORDER — MORPHINE SULFATE (PF) 2 MG/ML IV SOLN: 1.0000 mg | INTRAVENOUS | Status: DC | PRN

## 2016-06-27 MED ORDER — DOXAZOSIN MESYLATE 1 MG PO TABS
1.0000 mg | ORAL_TABLET | Freq: Every day | ORAL | Status: DC
  Administered 2016-06-27: 1 mg via ORAL
  Filled 2016-06-27: qty 1

## 2016-06-27 SURGICAL SUPPLY — 42 items
BINDER ABDOMINAL 12 ML 46-62 (SOFTGOODS) IMPLANT
COVER SURGICAL LIGHT HANDLE (MISCELLANEOUS) ×2 IMPLANT
DECANTER SPIKE VIAL GLASS SM (MISCELLANEOUS) ×2 IMPLANT
DEVICE SECURE STRAP 25 ABSORB (INSTRUMENTS) IMPLANT
DEVICE TROCAR PUNCTURE CLOSURE (ENDOMECHANICALS) IMPLANT
DISSECTOR BLUNT TIP ENDO 5MM (MISCELLANEOUS) IMPLANT
DRAIN CHANNEL 19F RND (DRAIN) IMPLANT
DRAPE LAPAROSCOPIC ABDOMINAL (DRAPES) ×2 IMPLANT
ELECT PENCIL ROCKER SW 15FT (MISCELLANEOUS) ×2 IMPLANT
ELECT REM PT RETURN 9FT ADLT (ELECTROSURGICAL) ×2
ELECTRODE REM PT RTRN 9FT ADLT (ELECTROSURGICAL) ×1 IMPLANT
EVACUATOR SILICONE 100CC (DRAIN) IMPLANT
GLOVE BIOGEL M 8.0 STRL (GLOVE) ×2 IMPLANT
GOWN SPEC L4 XLG W/TWL (GOWN DISPOSABLE) ×2 IMPLANT
GOWN STRL REUS W/TWL XL LVL3 (GOWN DISPOSABLE) ×6 IMPLANT
IRRIG SUCT STRYKERFLOW 2 WTIP (MISCELLANEOUS)
IRRIGATION SUCT STRKRFLW 2 WTP (MISCELLANEOUS) IMPLANT
KIT BASIN OR (CUSTOM PROCEDURE TRAY) ×2 IMPLANT
LIQUID BAND (GAUZE/BANDAGES/DRESSINGS) ×1 IMPLANT
MARKER SKIN DUAL TIP RULER LAB (MISCELLANEOUS) ×1 IMPLANT
MESH VENTRALEX ST 8CM LRG (Mesh General) ×1 IMPLANT
NDL SPNL 22GX3.5 QUINCKE BK (NEEDLE) IMPLANT
NEEDLE SPNL 22GX3.5 QUINCKE BK (NEEDLE) ×2 IMPLANT
PAD POSITIONING PINK XL (MISCELLANEOUS) IMPLANT
POSITIONER SURGICAL ARM (MISCELLANEOUS) IMPLANT
SHEARS CURVED HARMONIC AC 45CM (MISCELLANEOUS) ×1 IMPLANT
SLEEVE XCEL OPT CAN 5 100 (ENDOMECHANICALS) ×3 IMPLANT
SOLUTION ANTI FOG 6CC (MISCELLANEOUS) ×2 IMPLANT
STAPLER VISISTAT 35W (STAPLE) IMPLANT
STRIP CLOSURE SKIN 1/2X4 (GAUZE/BANDAGES/DRESSINGS) IMPLANT
SUT NOVA 0 T19/GS 22DT (SUTURE) ×2 IMPLANT
SUT NOVA NAB DX-16 0-1 5-0 T12 (SUTURE) IMPLANT
SUT PDS AB 1 CT1 27 (SUTURE) ×1 IMPLANT
SUT VIC AB 4-0 SH 18 (SUTURE) ×2 IMPLANT
TACKER 5MM HERNIA 3.5CML NAB (ENDOMECHANICALS) ×2 IMPLANT
TOWEL OR 17X26 10 PK STRL BLUE (TOWEL DISPOSABLE) ×3 IMPLANT
TOWEL OR NON WOVEN STRL DISP B (DISPOSABLE) ×2 IMPLANT
TRAY FOLEY W/METER SILVER 16FR (SET/KITS/TRAYS/PACK) ×2 IMPLANT
TRAY LAPAROSCOPIC (CUSTOM PROCEDURE TRAY) ×2 IMPLANT
TROCAR BLADELESS OPT 5 100 (ENDOMECHANICALS) ×2 IMPLANT
TROCAR XCEL NON-BLD 11X100MML (ENDOMECHANICALS) ×1 IMPLANT
TUBING INSUF HEATED (TUBING) ×2 IMPLANT

## 2016-06-27 NOTE — H&P (Signed)
Chief Complaint:  Umbilical hernia  History of Present Illness:  [name] is an 75 y.o. male on whom I did bilateral inguinal herniae in early 2000s.  He presented with a prominent symptomatic umbilical hernia which he wanted repaired.  He lives alone.   Past Medical History:  Diagnosis Date  . Allergy    takes Zyrtec daily  . Anxiety    takes Ativan daily as needed  . Arthritis   . Back pain   . BPH (benign prostatic hyperplasia)    takes doxazosin for  . 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 Depression    takes Wellbutrin daily  . Dyspnea on exertion    with exertion  . GERD (gastroesophageal reflux disease)    takes Nexium and Omeprazole daily  . History of kidney stones   . Hx of [facility] spotted fever childhood  . Hyperlipidemia   . Neuromuscular disorder ([place])   . Sleep apnea   . Swelling of lower extremity    right more than leg leg  . Umbilical hernia     Past Surgical History:  Procedure Laterality Date  . 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 BACK SURGERY  2003   L4, L5  . BACK SURGERY    . ENDOVENOUS ABLATION SAPHENOUS VEIN W/ LASER Right 06-01-2014   EVLA right greater saphenous vein by Curt Jews MD   . epidural steroid injections        [facility]  . hernia repair  2003  . HERNIA REPAIR     hernia inguinal x 2  . TOTAL KNEE ARTHROPLASTY Left 03/22/2015   Procedure: TOTAL KNEE ARTHROPLASTY;  Surgeon: Meredith Pel, MD;  Location: [place];  Service: Orthopedics;  Laterality: Left;  [name] VASCULAR SURGERY  2015   right leg    Current Facility-Administered Medications  Medication Dose Route Frequency Provider Last Rate Last Dose  . ceFAZolin (ANCEF) IVPB 2g/100 mL premix  2 g Intravenous On Call to [place], MD      . Chlorhexidine Gluconate Cloth 2 % PADS 6 each  6 each Topical Once Johnathan Hausen, MD       And  . Chlorhexidine Gluconate Cloth 2 % PADS 6 each  6 each Topical Once  Johnathan Hausen, MD      . lactated ringers infusion   Intravenous Continuous Roderic Palau, MD 125 mL/hr at 06/27/16 1051 1,000 mL at 06/27/16 1051   Review of patient's allergies indicates no known allergies. Family History  Problem Relation Age of Onset  . Thyroid disease Mother   . Heart disease Father   . Diabetes Maternal Grandfather    Social History:   reports that he has never smoked. He has never used smokeless tobacco. He reports that he does not drink alcohol or use drugs.   REVIEW OF SYSTEMS : Negative except for see problem list  Physical Exam:   Blood pressure (!) 152/91, pulse 77, temperature 97.8 F (36.6 C), temperature source Oral, resp. rate 20, height 5\' 10"  (1.778 m), weight 107.5 kg (237 lb), SpO2 97 %. Body mass index is 34.01 kg/m.  Gen:  WDWN WM NAD  Neurological: Alert and oriented to person, place, and time. Motor and sensory function is grossly intact  Head: Normocephalic and atraumatic.  Eyes: Conjunctivae are normal. Pupils are equal, round, and reactive to light. No scleral icterus.  Neck: Normal range  of motion. Neck supple. No tracheal deviation or thyromegaly present.  Cardiovascular:  SR without murmurs or gallops.  No carotid bruits Breast:  Not examined Respiratory: Effort normal.  No respiratory distress. No chest wall tenderness. Breath sounds normal.  No wheezes, rales or rhonchi.  Abdomen:  Protrusion in umbilicus marked in holding area GU:  Not examined Musculoskeletal: Normal range of motion. Extremities are nontender. No cyanosis, edema or clubbing noted Lymphadenopathy: No cervical, preauricular, postauricular or axillary adenopathy is present Skin: Skin is warm and dry. No rash noted. No diaphoresis. No erythema. No pallor. Pscyh: Normal mood and affect. Behavior is normal. Judgment and thought content normal.   LABORATORY RESULTS: No results found for this or any previous visit (from the past 48 hour(s)).   RADIOLOGY  RESULTS: No results found.  Problem List: Patient Active Problem List   Diagnosis Date Noted  . Degenerative arthritis of knee 03/22/2015  . Dyspnea on exertion 01/31/2015  . Lumbar radiculopathy 12/12/2014  . Facet hypertrophy of lumbar region 12/12/2014  . Left knee pain 12/12/2014  . Ulcer of lower limb ([place]) 01/10/2014  . Varicose veins of lower extremities with other complications [id number]  . 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 Renal insufficiency 12/21/2013  . Swelling in head/neck 07/28/2012  . Venous (peripheral) insufficiency 05/25/2012  . 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 Pain in limb 02/12/2012  . Hyperlipidemia 02/28/2008  . Allergic rhinitis 02/28/2008  . Sleep apnea 02/28/2008    Assessment & Plan: Umbilical hernia-symptomatic for lap assisted repair.      Matt B. Hassell Done, MD, [facility] [phone] beeper [phone]  06/27/2016 12:05 PM

## 2016-06-27 NOTE — Op Note (Signed)
Surgeon: Kaylyn Lim, MD, FACS  Asst:  none  Anes:  general  Procedure: Lap assisted umbilical hernia repair with 8 cm Ventralex mesh patch  Diagnosis: Large umbilical herni  Complications: none  EBL:   minimal cc  Drains: none  Description of Procedure:  The patient was taken to OR 1 at [facility].  After anesthesia was administered and the patient was prepped a timeout was performed.  Access to the abdomen was achieved with a 5 mm Optiview through the left upper quadrant.  The abdomen was insufflated and examined.  The prior hernia repairs in the groin were intact.  There were multiple loops of small bowel that appears matted together and were consistent with possible prior incarceration or other injury.    A curvilinear incision was made and the sac was dissected from the umbilical skin.  This large, fatty sac was excised in toto.  An 8 cm Ventralex mesh patch was inserted and sewn in place with horizontal mattress sutures of 0 Novafil.  The fascia was closed over this with a running 0 PDS.  The abdomen was reinflated and the mesh was lying nicely and in good position.    The patient tolerated the procedure well and was taken to the PACU in stable condition.     Matt B. Hassell Done, [place], [facility], [place]

## 2016-06-27 NOTE — Transfer of Care (Signed)
Immediate Anesthesia Transfer of Care Note  Patient: [name]  Procedure(s) Performed: Procedure(s): LAPAROSCOPIC ASSISTED REPAIR OF UMBILICAL HERINA WITH MESH (N/A)  Patient Location: PACU  Anesthesia Type:General  Level of Consciousness: awake, alert , oriented and patient cooperative  Airway & Oxygen Therapy: Patient Spontanous Breathing and Patient connected to face mask oxygen  Post-op Assessment: Report given to RN, Post -op Vital signs reviewed and stable and Patient moving all extremities  Post vital signs: Reviewed and stable  Last Vitals:  Vitals:   06/27/16 0928  BP: (!) 152/91  Pulse: 77  Resp: 20  Temp: 36.6 C    Last Pain:  Vitals:   06/27/16 0928  TempSrc: Oral      Patients Stated Pain Goal: 4 ([id number])  Complications: No apparent anesthesia complications

## 2016-06-27 NOTE — Progress Notes (Signed)
PHARMACIST - PHYSICIAN ORDER COMMUNICATION  CONCERNING: P&T Medication Policy on Herbal Medications  DESCRIPTION:  This patient's order for:  Glucosamine/Chondroitin  has been noted.  This product(s) is classified as an "herbal" or natural product. Due to a lack of definitive safety studies or FDA approval, nonstandard manufacturing practices, plus the potential risk of unknown drug-drug interactions while on inpatient medications, the Pharmacy and Therapeutics Committee does not permit the use of "herbal" or natural products of this type within [facility].   ACTION TAKEN: The pharmacy department is unable to verify this order at this time and your patient has been informed of this safety policy. Please reevaluate patient's clinical condition at discharge and address if the herbal or natural product(s) should be resumed at that time.  Netta Cedars, PharmD, BCPS 06/27/2016@3 :38 PM

## 2016-06-27 NOTE — Anesthesia Procedure Notes (Signed)
Procedure Name: Intubation Date/Time: 06/27/2016 12:33 PM Performed by: Carleene Cooper A Pre-anesthesia Checklist: Patient identified, Timeout performed, Patient being monitored, Emergency Drugs available and Suction available Patient Re-evaluated:Patient Re-evaluated prior to inductionOxygen Delivery Method: Circle system utilized Preoxygenation: Pre-oxygenation with 100% oxygen Intubation Type: IV induction Ventilation: Mask ventilation without difficulty Laryngoscope Size: Mac and 4 Grade View: Grade I Tube type: Oral Tube size: 7.5 mm Number of attempts: 1 Airway Equipment and Method: Stylet Placement Confirmation: ETT inserted through vocal cords under direct vision,  positive ETCO2 and breath sounds checked- equal and bilateral Secured at: 23 cm Tube secured with: Tape Dental Injury: Teeth and Oropharynx as per pre-operative assessment

## 2016-06-27 NOTE — Anesthesia Preprocedure Evaluation (Addendum)
Anesthesia Evaluation  Patient identified by MRN, date of birth, ID band Patient awake    Reviewed: Allergy & Precautions, H&P , NPO status , Patient's Chart, lab work & pertinent test results  Airway Mallampati: II  TM Distance: >3 FB Neck ROM: Full    Dental no notable dental hx. (+) Teeth Intact, Dental Advisory Given   Pulmonary sleep apnea and Continuous Positive Airway Pressure Ventilation ,    Pulmonary exam normal breath sounds clear to auscultation       Cardiovascular negative cardio ROS   Rhythm:Regular Rate:Normal     Neuro/Psych Anxiety Depression negative neurological ROS     GI/Hepatic Neg liver ROS, GERD  Medicated and Controlled,  Endo/Other  negative endocrine ROS  Renal/GU negative Renal ROS  negative genitourinary   Musculoskeletal  (+) Arthritis , Osteoarthritis,    Abdominal   Peds  Hematology negative hematology ROS (+)   Anesthesia Other Findings   Reproductive/Obstetrics negative OB ROS                            Anesthesia Physical Anesthesia Plan  ASA: III  Anesthesia Plan: General   Post-op Pain Management:    Induction: Intravenous  Airway Management Planned: Oral ETT  Additional Equipment:   Intra-op Plan:   Post-operative Plan: Extubation in OR  Informed Consent: I have reviewed the patients History and Physical, chart, labs and discussed the procedure including the risks, benefits and alternatives for the proposed anesthesia with the patient or authorized representative who has indicated his/her understanding and acceptance.   Dental advisory given  Plan Discussed with: CRNA  Anesthesia Plan Comments:         Anesthesia Quick Evaluation

## 2016-06-27 NOTE — Anesthesia Postprocedure Evaluation (Signed)
Anesthesia Post Note  Patient: [name]  Procedure(s) Performed: Procedure(s) (LRB): LAPAROSCOPIC ASSISTED REPAIR OF UMBILICAL HERINA WITH MESH (N/A)  Patient location during evaluation: PACU Anesthesia Type: General Level of consciousness: awake and alert Pain management: pain level controlled Vital Signs Assessment: post-procedure vital signs reviewed and stable Respiratory status: spontaneous breathing, nonlabored ventilation, respiratory function stable and patient connected to nasal cannula oxygen Cardiovascular status: blood pressure returned to baseline and stable Postop Assessment: no signs of nausea or vomiting Anesthetic complications: no    Last Vitals:  Vitals:   06/27/16 1430 06/27/16 1445  BP: (!) 163/88 (!) 161/90  Pulse: (!) 50 (!) 48  Resp: 14 11  Temp:  36.6 C    Last Pain:  Vitals:   06/27/16 1445  TempSrc:   PainSc: 3                  Adamariz Gillott,W. EDMOND

## 2016-06-28 DIAGNOSIS — K429 Umbilical hernia without obstruction or gangrene: Secondary | ICD-10-CM | POA: Diagnosis not present

## 2016-06-28 DIAGNOSIS — N400 Enlarged prostate without lower urinary tract symptoms: Secondary | ICD-10-CM | POA: Diagnosis not present

## 2016-06-28 DIAGNOSIS — F329 Major depressive disorder, single episode, unspecified: Secondary | ICD-10-CM | POA: Diagnosis not present

## 2016-06-28 DIAGNOSIS — F419 Anxiety disorder, unspecified: Secondary | ICD-10-CM | POA: Diagnosis not present

## 2016-06-28 DIAGNOSIS — K219 Gastro-esophageal reflux disease without esophagitis: Secondary | ICD-10-CM | POA: Diagnosis not present

## 2016-06-28 DIAGNOSIS — G4733 Obstructive sleep apnea (adult) (pediatric): Secondary | ICD-10-CM | POA: Diagnosis not present

## 2016-06-28 NOTE — Progress Notes (Signed)
Discharge instructions reviewed with patient utilizing teach back method. No questions at this time. Patient discharged to home. 

## 2016-06-28 NOTE — Discharge Instructions (Signed)
HERNIA REPAIR: POST OP INSTRUCTIONS ° °###################################################################### ° °EAT °Gradually transition to a high fiber diet with a fiber supplement over the next few weeks after discharge.  Start with a pureed / full liquid diet (see below) ° °WALK °Walk an hour a day.  Control your pain to do that.   ° °CONTROL PAIN °Control pain so that you can walk, sleep, tolerate sneezing/coughing, go up/down stairs. ° °HAVE A BOWEL MOVEMENT DAILY °Keep your bowels regular to avoid problems.  OK to try a laxative to override constipation.  OK to use an antidairrheal to slow down diarrhea.  Call if not better after 2 tries ° °CALL IF YOU HAVE PROBLEMS/CONCERNS °Call if you are still struggling despite following these instructions. °Call if you have concerns not answered by these instructions ° °###################################################################### ° ° ° °1. DIET: Follow a light bland diet the first 24 hours after arrival home, such as soup, liquids, crackers, etc.  Be sure to include lots of fluids daily.  Avoid fast food or heavy meals as your are more likely to get nauseated.  Eat a low fat the next few days after surgery. °2. Take your usually prescribed home medications unless otherwise directed. °3. PAIN CONTROL: °a. Pain is best controlled by a usual combination of three different methods TOGETHER: °i. Ice/Heat °ii. Over the counter pain medication °iii. Prescription pain medication °b. Most patients will experience some swelling and bruising around the hernia(s) such as the bellybutton, groins, or old incisions.  Ice packs or heating pads (30-60 minutes up to 6 times a day) will help. Use ice for the first few days to help decrease swelling and bruising, then switch to heat to help relax tight/sore spots and speed recovery.  Some people prefer to use ice alone, heat alone, alternating between ice & heat.  Experiment to what works for you.  Swelling and bruising can take  several weeks to resolve.   °c. It is helpful to take an over-the-counter pain medication regularly for the first few weeks.  Choose one of the following that works best for you: °i. Naproxen (Aleve, etc)  Two 220mg tabs twice a day °ii. Ibuprofen (Advil, etc) Three 200mg tabs four times a day (every meal & bedtime) °iii. Acetaminophen (Tylenol, etc) 325-650mg four times a day (every meal & bedtime) °d. A  prescription for pain medication should be given to you upon discharge.  Take your pain medication as prescribed.  °i. If you are having problems/concerns with the prescription medicine (does not control pain, nausea, vomiting, rash, itching, etc), please call us [phone] to see if we need to switch you to a different pain medicine that will work better for you and/or control your side effect better. °ii. If you need a refill on your pain medication, please contact your pharmacy.  They will contact our office to request authorization. Prescriptions will not be filled after 5 pm or on week-ends. °4. Avoid getting constipated.  Between the surgery and the pain medications, it is common to experience some constipation.  Increasing fluid intake and taking a fiber supplement (such as Metamucil, Citrucel, FiberCon, MiraLax, etc) 1-2 times a day regularly will usually help prevent this problem from occurring.  A mild laxative (prune juice, Milk of Magnesia, MiraLax, etc) should be taken according to package directions if there are no bowel movements after 48 hours.   °5. Wash / shower every day.  You may shower over the dressings as they are waterproof.   °6. Remove   your waterproof bandages 5 days after surgery.  You may leave the incision open to air.  You may replace a dressing/Band-Aid to cover the incision for comfort if you wish.  Continue to shower over incision(s) after the dressing is off.    7. ACTIVITIES as tolerated:   a. You may resume regular (light) daily activities beginning the next day--such  as daily self-care, walking, climbing stairs--gradually increasing activities as tolerated.  If you can walk 30 minutes without difficulty, it is safe to try more intense activity such as jogging, treadmill, bicycling, low-impact aerobics, swimming, etc. b. Save the most intensive and strenuous activity for last such as sit-ups, heavy lifting, contact sports, etc  Refrain from any heavy lifting or straining until you are off narcotics for pain control.   c. DO NOT PUSH THROUGH PAIN.  Let pain be your guide: If it hurts to do something, don't do it.  Pain is your body warning you to avoid that activity for another week until the pain goes down. d. You may drive when you are no longer taking prescription pain medication, you can comfortably wear a seatbelt, and you can safely maneuver your car and apply brakes. e. Dennis Bast may have sexual intercourse when it is comfortable.  8. FOLLOW UP in our office a. Please call [facility] at [phone] to set up an appointment to see your surgeon in the office for a follow-up appointment approximately 2-3 weeks after your surgery. b. Make sure that you call for this appointment the day you arrive home to insure a convenient appointment time. 9.  IF YOU HAVE DISABILITY OR FAMILY LEAVE FORMS, BRING THEM TO THE OFFICE FOR PROCESSING.  DO NOT GIVE THEM TO YOUR DOCTOR.  WHEN TO CALL us [phone]: 1. Poor pain control 2. Reactions / problems with new medications (rash/itching, nausea, etc)  3. Fever over 101.5 F (38.5 C) 4. Inability to urinate 5. Nausea and/or vomiting 6. Worsening swelling or bruising 7. Continued bleeding from incision. 8. Increased pain, redness, or drainage from the incision   The clinic staff is available to answer your questions during regular business hours (8:30am-5pm).  Please dont hesitate to call and ask to speak to one of our nurses for clinical concerns.   If you have a medical emergency, go to the nearest emergency room or call  911.  A surgeon from [facility] is always on call at the hospitals in [facility], [place], [place], [place], [place]  [address] ?  [address] [address], [place], [place]   [address] MAIN: [phone] ? TOLL FREE: [phone] ? FAX: [phone] [email or web address]  Hernia, Adult A hernia is the bulging of an organ or tissue through a weak spot in the muscles of the abdomen (abdominal wall). Hernias develop most often near the navel or groin. There are many kinds of hernias. Common kinds include:  Femoral hernia. This kind of hernia develops under the groin in the upper thigh area.  Inguinal hernia. This kind of hernia develops in the groin or scrotum.  Umbilical hernia. This kind of hernia develops near the navel.  Hiatal hernia. This kind of hernia causes part of the stomach to be pushed up into the chest.  Incisional hernia. This kind of hernia bulges through a scar from an abdominal surgery. CAUSES This condition may be caused by:  Heavy lifting.  Coughing over a long period of time.  Straining to have a bowel movement.  An incision made during an abdominal surgery.  A birth defect (congenital defect).  Excess weight or obesity.  Smoking.  Poor nutrition.  Cystic fibrosis.  Excess fluid in the abdomen.  Undescended testicles. SYMPTOMS Symptoms of a hernia include:  A lump on the abdomen. This is the first sign of a hernia. The lump may become more obvious with standing, straining, or coughing. It may get bigger over time if it is not treated or if the condition causing it is not treated.  Pain. A hernia is usually painless, but it may become painful over time if treatment is delayed. The pain is usually dull and may get worse with standing or lifting heavy objects. Sometimes a hernia gets tightly squeezed in the weak spot (strangulated) or stuck there (incarcerated) and causes additional symptoms.  These symptoms may include:  Vomiting.  Nausea.  Constipation.  Irritability. DIAGNOSIS A hernia may be diagnosed with:  A physical exam. During the exam your health care provider may ask you to cough or to make a specific movement, because a hernia is usually more visible when you move.  Imaging tests. These can include:  X-rays.  Ultrasound.  CT scan. TREATMENT A hernia that is small and painless may not need to be treated. A hernia that is large or painful may be treated with surgery. Inguinal hernias may be treated with surgery to prevent incarceration or strangulation. Strangulated hernias are always treated with surgery, because lack of blood to the trapped organ or tissue can cause it to die. Surgery to treat a hernia involves pushing the bulge back into place and repairing the weak part of the abdomen. HOME CARE INSTRUCTIONS  Avoid straining.  Do not lift anything heavier than 10 lb (4.5 kg).  Lift with your leg muscles, not your back muscles. This helps avoid strain.  When coughing, try to cough gently.  Prevent constipation. Constipation leads to straining with bowel movements, which can make a hernia worse or cause a hernia repair to break down. You can prevent constipation by:  Eating a high-fiber diet that includes plenty of fruits and vegetables.  Drinking enough fluids to keep your urine clear or pale yellow. Aim to drink 6-8 glasses of water per day.  Using a stool softener as directed by your health care provider.  Lose weight, if you are overweight.  Do not use any tobacco products, including cigarettes, chewing tobacco, or electronic cigarettes. If you need help quitting, ask your health care provider.  Keep all follow-up visits as directed by your health care provider. This is important. Your health care provider may need to monitor your condition. SEEK MEDICAL CARE IF:  You have swelling, redness, and pain in the affected area.  Your bowel  habits change. SEEK IMMEDIATE MEDICAL CARE IF:  You have a fever.  You have abdominal pain that is getting worse.  You feel nauseous or you vomit.  You cannot push the hernia back in place by gently pressing on it while you are lying down.  The hernia:  Changes in shape or size.  Is stuck outside the abdomen.  Becomes discolored.  Feels hard or tender.   This information is not intended to replace advice given to you by your health care provider. Make sure you discuss any questions you have with your health care provider.   Document Released: 10/27/2005 Document Revised: 11/17/2014 Document Reviewed: 09/06/2014 Elsevier Interactive Patient Education [employer].

## 2016-06-28 NOTE — Discharge Summary (Signed)
Physician Discharge Summary  Patient ID: [name] MRN: [id number] DOB/AGE: February 20, 1941 75 y.o.  Admit date: 06/27/2016 Discharge date: 06/28/2016  Patient Care Team: [name], MD as PCP - General (Family Medicine) Irine Seal, MD as Attending Physician (Urology) [name], MD (Family Medicine)  Admission Diagnoses: Principal Problem:   Umbilical hernia s/p lap repair with mesh 06/27/2016   Discharge Diagnoses:  Principal Problem:   Umbilical hernia s/p lap repair with mesh 06/27/2016   POST-OPERATIVE DIAGNOSIS:   Large umbilical hernia  SURGERY:  06/27/2016  Procedure(s): LAPAROSCOPIC ASSISTED REPAIR OF UMBILICAL HERINA WITH MESH  SURGEON:    Surgeon(s): Johnathan Hausen, MD  Consults: None  Hospital Course:   The patient underwent the surgery above.  Postoperatively, the patient gradually mobilized and advanced to a solid diet.  Pain and other symptoms were treated aggressively.    By the time of discharge, the patient was walking well the hallways, eating food, having flatus.  Pain was well-controlled on an oral medications.  Based on meeting discharge criteria and continuing to recover, I felt it was safe for the patient to be discharged from the hospital to further recover with close followup. Postoperative recommendations were discussed in detail.  They are written as well.   Significant Diagnostic Studies:  No results found for this or any previous visit (from the past 72 hour(s)).  No results found.  Discharge Exam: Blood pressure 118/60, pulse 60, temperature 98.2 F (36.8 C), temperature source Oral, resp. rate 18, height 5\' 10"  (1.778 m), weight 107.5 kg (237 lb), SpO2 98 %.  General: Pt awake/alert/oriented x4 in no major acute distress Eyes: PERRL, normal EOM. Sclera nonicteric Neuro: CN II-XII intact w/o focal sensory/motor deficits. Lymph: No head/neck/groin lymphadenopathy Psych:  No delerium/psychosis/paranoia HENT: Normocephalic,  Mucus membranes moist.  No thrush Neck: Supple, No tracheal deviation Chest: No pain.  Good respiratory excursion. CV:  Pulses intact.  Regular rhythm MS: Normal AROM mjr joints.  No obvious deformity Abdomen: Soft, Nondistended.  Obese.  Mild TTP at bellybutton.  Rest is nontender.  No incarcerated hernias. Ext:  SCDs BLE.  No significant edema.  No cyanosis Skin: No petechiae / purpura  Discharged Condition: good   Past Medical History:  Diagnosis Date  . Allergy    takes Zyrtec daily  . Anxiety    takes Ativan daily as needed  . Arthritis   . Back pain   . BPH (benign prostatic hyperplasia)    takes doxazosin for  . 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 Depression    takes Wellbutrin daily  . Dyspnea on exertion    with exertion  . GERD (gastroesophageal reflux disease)    takes Nexium and Omeprazole daily  . History of kidney stones   . 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 Hyperlipidemia   . Neuromuscular disorder ([place])   . Sleep apnea   . Swelling of lower extremity    right more than leg leg  . Umbilical hernia     Past Surgical History:  Procedure Laterality Date  . 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 BACK SURGERY  2003   L4, L5  . BACK SURGERY    . ENDOVENOUS ABLATION SAPHENOUS VEIN W/ LASER Right 06-01-2014   EVLA right greater saphenous vein by Curt Jews MD   . epidural steroid injections        [facility] dr  [place]  . hernia repair  2003  . HERNIA REPAIR     hernia inguinal x 2  . 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 Marital status: Single    Spouse name: N/A  . Number of children: N/A  . Years of education: N/A   Occupational History  . [employer] Retired   Social  History Main Topics  . Smoking status: Never Smoker  . Smokeless tobacco: Never Used  . Alcohol use No  . Drug use: No  . Sexual activity: Yes    Birth control/ protection: Condom   Other Topics Concern  . Not on file   Social History Narrative   ** Merged History Encounter **       Single. Education: Other.     Family History  Problem Relation Age of Onset  . Thyroid disease Mother   . Heart disease Father   . Diabetes Maternal Grandfather     Current Facility-Administered Medications  Medication Dose Route Frequency Provider Last Rate Last Dose  . buPROPion (WELLBUTRIN XL) 24 hr tablet 150 mg  150 mg Oral Daily Johnathan Hausen, MD   150 mg at 06/28/16 0945  . calcium carbonate (TUMS - dosed in mg elemental calcium) chewable tablet 200 mg of elemental calcium  1 tablet Oral QPM Johnathan Hausen, MD      . dextrose 5 % and 0.45 % NaCl with KCl 20 mEq/L infusion   Intravenous Continuous Johnathan Hausen, MD 100 mL/hr at 06/28/16 0346    . doxazosin (CARDURA) tablet 1 mg  1 mg Oral QHS Johnathan Hausen, MD   1 mg at 06/27/16 2126  . fluticasone (FLONASE) 50 MCG/ACT nasal spray 2 spray  2 spray Each Nare Daily Johnathan Hausen, MD   2 spray at 06/28/16 1047  . furosemide (LASIX) tablet 40 mg  40 mg Oral Daily Johnathan Hausen, MD   40 mg at 06/28/16 0945  . gabapentin (NEURONTIN) capsule 300-600 mg  300-600 mg Oral QHS Johnathan Hausen, MD   600 mg at 06/27/16 2125  . heparin injection 5,000 Units  5,000 Units Subcutaneous Q8H Johnathan Hausen, MD   5,000 Units at 06/28/16 [phone]  . HYDROcodone-acetaminophen (NORCO/VICODIN) 5-325 MG per tablet 1 tablet  1 tablet Oral Q4H PRN Johnathan Hausen, MD   1 tablet at 06/28/16 0945  . loratadine (CLARITIN) tablet 10 mg  10 mg Oral Daily Johnathan Hausen, MD   10 mg at 06/28/16 0945  . LORazepam (ATIVAN) tablet 0.5 mg  0.5 mg Oral BID PRN Johnathan Hausen, MD      . methocarbamol (ROBAXIN) tablet 500 mg  500 mg Oral Q6H PRN Johnathan Hausen, MD   500 mg at 06/28/16  0945  . morphine 2 MG/ML injection 1 mg  1 mg Intravenous Q1H PRN Johnathan Hausen, MD      . multivitamin with minerals tablet 1 tablet  1 tablet Oral Daily Johnathan Hausen, MD   1 tablet at 06/28/16 0946  . ondansetron (ZOFRAN-ODT) disintegrating tablet 4 mg  4 mg Oral Q6H PRN Johnathan Hausen, MD       Or  . ondansetron [facility]) injection 4 mg  4 mg Intravenous Q6H PRN Johnathan Hausen, MD      . pantoprazole (PROTONIX) EC tablet 40  mg  40 mg Oral Daily Johnathan Hausen, MD   40 mg at 06/28/16 0946  . temazepam (RESTORIL) capsule 15 mg  15 mg Oral QHS PRN Johnathan Hausen, MD         No Known Allergies  Disposition: 01-Home or Self Care  Discharge Instructions    Call MD for:    Complete by:  As directed   Temperature > 101.80F   Call MD for:  extreme fatigue    Complete by:  As directed   Call MD for:  hives    Complete by:  As directed   Call MD for:  persistant nausea and vomiting    Complete by:  As directed   Call MD for:  redness, tenderness, or signs of infection (pain, swelling, redness, odor or green/yellow discharge around incision site)    Complete by:  As directed   Call MD for:  severe uncontrolled pain    Complete by:  As directed   Diet - low sodium heart healthy    Complete by:  As directed   Start with bland, low residue diet for a few days, then advance to a heart healthy (low fat, high fiber) diet.  If you feel nauseated or constipated, simplify to a liquid only diet for 48 hours until you are feeling better (no more nausea, farting/passing gas, having a bowel movement, etc...).  If you cannot tolerate even drinking liquids, or feeling worse, let your surgeon know or go to the Emergency Department for help.   Discharge instructions    Complete by:  As directed   Please see discharge instruction sheets.   Also refer to any handouts/printouts that may have been given from the [facility] surgery office (if you visited [place] there before surgery) Please call our office if you have any  questions or concerns [phone]   Discharge wound care:    Complete by:  As directed   If you have closed incisions: Shower and bathe over these incisions with soap and water every day.  It is OK to wash over the dressings: they are waterproof. Remove all surgical dressings on postoperative day #3.  You do not need to replace dressings over the closed incisions unless you feel more comfortable with a Band-Aid covering it.   If you have an open wound: That requires packing, so please see wound care instructions.   In general, remove all dressings, wash wound with soap and water and then replace with saline moistened gauze.  Do the dressing change at least every day.    Please call our office [phone] if you have further questions.   Driving Restrictions    Complete by:  As directed   No driving until off narcotics and can safely swerve away without pain during an emergency   Increase activity slowly    Complete by:  As directed   Lifting restrictions    Complete by:  As directed   Avoid heavy lifting initially, <20 pounds at first.   Do not push through pain.   You have no specific weight limit: If it hurts to do, DON'T DO IT.    If you feel no pain, you are not injuring anything.  Pain will protect you from injury.   Coughing and sneezing are far more stressful to your incision than any lifting.   Avoid resuming heavy lifting (>50 pounds) or other intense activity until off all narcotic pain medications.   When want to exercise more, give yourself 2  weeks to gradually get back to full intense exercise/activity.   May shower / Bathe    Complete by:  As directed   [place].  It is fine for dressings or wounds to be washed/rinsed.  Use gentle soap & water.  This will help the incisions and/or wounds get clean & minimize infection.   May walk up steps    Complete by:  As directed   Sexual Activity Restrictions    Complete by:  As directed   Sexual activity as tolerated.  Do not  push through pain.  Pain will protect you from injury.   Walk with assistance    Complete by:  As directed   Walk over an hour a day.  May use a walker/cane/companion to help with balance and stamina.       Medication List    TAKE these medications   buPROPion 300 MG 24 hr tablet Commonly known as:  WELLBUTRIN XL TAKE 1 TABLET BY MOUTH DAILY *NEEDS OFFICE VISIT   calcium carbonate 200 MG capsule Take 200 mg by mouth daily.   cetirizine 10 MG tablet Commonly known as:  ZYRTEC Take 10 mg by mouth every morning.   doxazosin 8 MG tablet Commonly known as:  CARDURA TAKE 1 TABLET BY MOUTH DAILY  . Take at night What changed:  when to take this  additional instructions   esomeprazole 20 MG capsule Commonly known as:  NEXIUM Take 1 capsule (20 mg total) by mouth daily.   flurazepam 15 MG capsule Commonly known as:  DALMANE Take 1 capsule (15 mg total) by mouth at bedtime as needed for sleep.   fluticasone 50 MCG/ACT nasal spray Commonly known as:  FLONASE Place 2 sprays into both nostrils daily.   furosemide 40 MG tablet Commonly known as:  LASIX Take 1 tablet (40 mg total) by mouth daily.   gabapentin 300 MG capsule Commonly known as:  NEURONTIN Take 1-2 capsules at bedtime   glucosamine-chondroitin 500-400 MG tablet Take 1 tablet by mouth 2 (two) times daily.   HYDROcodone-acetaminophen 5-325 MG tablet Commonly known as:  NORCO 1 tab every 8 hours   LORazepam 0.5 MG tablet Commonly known as:  ATIVAN Take 1 tablet (0.5 mg total) by mouth 2 (two) times daily as needed for anxiety.   meloxicam 7.5 MG tablet Commonly known as:  MOBIC TAKE 1-2 TABLETS BY MOUTH DAILY   methocarbamol 500 MG tablet Commonly known as:  ROBAXIN Take 1 tablet (500 mg total) by mouth every 6 (six) hours as needed for muscle spasms.   multivitamin tablet Take 1 tablet by mouth daily.   NEEDLE (DISP) 21 G 21G X 1-1/2" Misc Use to inject depotestosterone.   potassium chloride SA  20 MEQ tablet Commonly known as:  K-DUR,KLOR-CON Take 2 tablets (40 mEq total) by mouth daily. What changed:  how much to take  when to take this   sildenafil 20 MG tablet Commonly known as:  REVATIO Take 5 tablets daily as needed for erectile dysfunction.   Syringe (Reusable) 3 ML Misc Use to inject depotestosterone.   testosterone cypionate 100 MG/ML injection Commonly known as:  DEPOTESTOTERONE CYPIONATE Inject 1 mL (100 mg total) into the muscle every 7 (seven) days. For IM use only      Follow-up Information    Pedro Earls, MD.   Specialty:  General Surgery Contact information: [address] [place] [address] [phone]  Signed: Morton Peters, M.D., F.A.C.S. Gastrointestinal and Minimally Invasive Surgery [facility] [address] [place], [place] [address] [phone] Main / Paging   06/28/2016, 11:21 AM

## 2016-07-02 ENCOUNTER — Encounter (HOSPITAL_COMMUNITY): Payer: Self-pay | Admitting: Surgery

## 2016-07-21 ENCOUNTER — Telehealth: Payer: Self-pay

## 2016-07-21 DIAGNOSIS — R1032 Left lower quadrant pain: Secondary | ICD-10-CM | POA: Diagnosis not present

## 2016-07-21 DIAGNOSIS — Z1211 Encounter for screening for malignant neoplasm of colon: Secondary | ICD-10-CM | POA: Diagnosis not present

## 2016-07-21 NOTE — Telephone Encounter (Signed)
Patient is calling to request a refill for hydrocodone.   [phone]

## 2016-07-23 ENCOUNTER — Ambulatory Visit (INDEPENDENT_AMBULATORY_CARE_PROVIDER_SITE_OTHER): Payer: Medicare Other | Admitting: Emergency Medicine

## 2016-07-23 ENCOUNTER — Ambulatory Visit (INDEPENDENT_AMBULATORY_CARE_PROVIDER_SITE_OTHER): Payer: Medicare Other

## 2016-07-23 VITALS — BP 138/80 | HR 79 | Temp 98.1°F | Resp 16 | Ht 69.0 in | Wt 242.0 lb

## 2016-07-23 DIAGNOSIS — M25512 Pain in left shoulder: Secondary | ICD-10-CM | POA: Diagnosis not present

## 2016-07-23 DIAGNOSIS — R600 Localized edema: Secondary | ICD-10-CM

## 2016-07-23 DIAGNOSIS — G4733 Obstructive sleep apnea (adult) (pediatric): Secondary | ICD-10-CM

## 2016-07-23 DIAGNOSIS — Z23 Encounter for immunization: Secondary | ICD-10-CM | POA: Diagnosis not present

## 2016-07-23 DIAGNOSIS — H539 Unspecified visual disturbance: Secondary | ICD-10-CM

## 2016-07-23 DIAGNOSIS — R413 Other amnesia: Secondary | ICD-10-CM

## 2016-07-23 DIAGNOSIS — M5441 Lumbago with sciatica, right side: Secondary | ICD-10-CM

## 2016-07-23 DIAGNOSIS — M5442 Lumbago with sciatica, left side: Secondary | ICD-10-CM | POA: Diagnosis not present

## 2016-07-23 DIAGNOSIS — L989 Disorder of the skin and subcutaneous tissue, unspecified: Secondary | ICD-10-CM | POA: Diagnosis not present

## 2016-07-23 DIAGNOSIS — F411 Generalized anxiety disorder: Secondary | ICD-10-CM

## 2016-07-23 DIAGNOSIS — Z9989 Dependence on other enabling machines and devices: Secondary | ICD-10-CM

## 2016-07-23 DIAGNOSIS — M19012 Primary osteoarthritis, left shoulder: Secondary | ICD-10-CM | POA: Diagnosis not present

## 2016-07-23 MED ORDER — ESOMEPRAZOLE MAGNESIUM 20 MG PO CPDR: 20.0000 mg | DELAYED_RELEASE_CAPSULE | Freq: Every day | ORAL | 1 refills | Status: DC

## 2016-07-23 MED ORDER — HYDROCODONE-ACETAMINOPHEN 5-325 MG PO TABS: ORAL_TABLET | ORAL | 0 refills | Status: DC

## 2016-07-23 MED ORDER — MELOXICAM 7.5 MG PO TABS: ORAL_TABLET | ORAL | 1 refills | Status: DC

## 2016-07-23 MED ORDER — GABAPENTIN 300 MG PO CAPS: ORAL_CAPSULE | ORAL | 1 refills | Status: DC

## 2016-07-23 MED ORDER — POTASSIUM CHLORIDE CRYS ER 20 MEQ PO TBCR: 20.0000 meq | EXTENDED_RELEASE_TABLET | Freq: Two times a day (BID) | ORAL | 3 refills | Status: DC

## 2016-07-23 MED ORDER — LORAZEPAM 0.5 MG PO TABS: ORAL_TABLET | ORAL | 5 refills | Status: DC

## 2016-07-23 NOTE — Progress Notes (Addendum)
Patient ID: [name], male   DOB: 1941/05/10, 75 y.o.   MRN: [id number]    By signing my name below I, Tereasa Coop, attest that this documentation has been prepared under the direction and in the presence of [name], MD. Electonically Signed. Tereasa Coop, Scribe 07/23/2016 at 11:59 AM  Chief Complaint:  Chief Complaint  Patient presents with  . Follow-up    generalize health follow up   . Medication Refill    Hydrocodone and Lorazepam , and all chronic medications 90 day supply    HPI: [name] is a 75 y.o. male who reports to [facility] today for follow up evaluation and medication refill.   Pt reports that he never uses hydrocodone and lorazepam together. Pt only takes hydrocodone in the mornings if he worked hard the day before and did a lot of heavy lifting. Pt takes lorazepam as needed, usually at night to help with sleep. Pt also takes Wellbutrin.  Pt reports that he has been noticing multiple scabs and moles. Pt was referred to dermatology a year ago. Pt did not make it to the referral appointment and is requesting another dermatology referral.   Pt also reports of intermittent left shoulder pain that is unchanged with movement. Pt denies any strain or injury to shoulder.  Pt reports difficulty "getting going" in the morning. Pt has sleep apnea and uses he CPAP machine faithfully. CPAP machine has not been checked up on in a long time.   Pt wants to discuss new PCP options.   Pt is scheduled for a colonoscopy next week.  Pt has not seen an eye doctor in 10 years and is reporting "fuzzy vision" and requesting referral to eye doctor.   Pt states that he had his pneumonia and flu vaccine within the past month.   Tetanus shot is up to date.  Immunization History  Administered Date(s) Administered  . Influenza Split 09/28/2012  . Influenza,inj,Quad PF,36+ Mos 08/03/2013, 07/21/2014, 09/13/2015, 07/23/2016  . Pneumococcal Conjugate-13 03/25/2015  . Pneumococcal  Polysaccharide-23 04/29/2011, 06/28/2016  . Tdap 04/25/2013     Past Medical History:  Diagnosis Date  . Allergy    takes Zyrtec daily  . Anxiety    takes Ativan daily as needed  . Arthritis   . Back pain   . BPH (benign prostatic hyperplasia)    takes doxazosin for  . 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 Depression    takes Wellbutrin daily  . Dyspnea on exertion    with exertion  . GERD (gastroesophageal reflux disease)    takes Nexium and Omeprazole daily  . History of kidney stones   . 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 Hyperlipidemia   . Neuromuscular disorder ([place])   . Sleep apnea   . Swelling of lower extremity    right more than leg leg  . Umbilical hernia    Past Surgical History:  Procedure Laterality Date  . 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 BACK SURGERY  2003   L4, L5  . BACK SURGERY    . ENDOVENOUS ABLATION SAPHENOUS VEIN W/ LASER Right 06-01-2014   EVLA right greater saphenous vein by Curt Jews MD   . epidural steroid injections        [facility]  . hernia repair  2003  . HERNIA REPAIR     hernia inguinal x 2  .  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 UMBILICAL HERNIA REPAIR N/A 06/27/2016   Procedure: LAPAROSCOPIC ASSISTED REPAIR OF UMBILICAL HERINA WITH MESH;  Surgeon: Johnathan Hausen, MD;  Location[facility];  Service: General;  Laterality: N/A;  . VASCULAR SURGERY  2015   right leg   Social History   Social History  . Marital status: Single    Spouse name: N/A  . Number of children: N/A  . Years of education: N/A   Occupational History  . Antiques/Real Estate Retired   Social History Main Topics  . Smoking status: Never Smoker  . Smokeless tobacco: Never Used  . Alcohol use No  . Drug use: No  . Sexual activity: Yes    Birth control/  protection: Condom   Other Topics Concern  . None   Social History Narrative   ** Merged History Encounter **       Single. Education: Other.    Family History  Problem Relation Age of Onset  . Thyroid disease Mother   . Heart disease Father   . Diabetes Maternal Grandfather    No Known Allergies Prior to Admission medications   Medication Sig Start Date End Date Taking? Authorizing Provider  buPROPion (WELLBUTRIN XL) 300 MG 24 hr tablet TAKE 1 TABLET BY MOUTH DAILY *NEEDS OFFICE VISIT 03/11/16  Yes [name], MD  calcium carbonate 200 MG capsule Take 200 mg by mouth daily.    Yes Historical Provider, MD  cetirizine (ZYRTEC) 10 MG tablet Take 10 mg by mouth every morning.    Yes Historical Provider, MD  doxazosin (CARDURA) 8 MG tablet TAKE 1 TABLET BY MOUTH DAILY  . Take at night Patient taking differently: every evening. TAKE 1 TABLET BY MOUTH DAILY  . Take at night 03/11/16  Yes [name], MD  esomeprazole (NEXIUM) 20 MG capsule Take 1 capsule (20 mg total) by mouth daily. 03/11/16  Yes [name], MD  flurazepam [facility]) 15 MG capsule Take 1 capsule (15 mg total) by mouth at bedtime as needed for sleep. 03/11/16  Yes [name], MD  fluticasone (FLONASE) 50 MCG/ACT nasal spray Place 2 sprays into both nostrils daily. 03/11/16  Yes [name], MD  furosemide (LASIX) 40 MG tablet Take 1 tablet (40 mg total) by mouth daily. 03/11/16  Yes [name], MD  gabapentin (NEURONTIN) 300 MG capsule Take 1-2 capsules at bedtime 03/11/16  Yes [name], MD  glucosamine-chondroitin 500-400 MG tablet Take 1 tablet by mouth 2 (two) times daily.   Yes Historical Provider, MD  HYDROcodone-acetaminophen (NORCO) 5-325 MG tablet 1 tab every 8 hours 05/19/16  Yes [name], MD  LORazepam (ATIVAN) 0.5 MG tablet Take 1 tablet (0.5 mg total) by mouth 2 (two) times daily as needed for anxiety. 03/11/16  Yes [name], MD  meloxicam (MOBIC) 7.5 MG tablet TAKE 1-2 TABLETS BY MOUTH DAILY 03/11/16   Yes [name], MD  methocarbamol (ROBAXIN) 500 MG tablet Take 1 tablet (500 mg total) by mouth every 6 (six) hours as needed for muscle spasms. 03/11/16  Yes [name], MD  Multiple Vitamin (MULTIVITAMIN) tablet Take 1 tablet by mouth daily.   Yes Historical Provider, MD  NEEDLE, DISP, 21 G 21G X 1-1/2" MISC Use to inject depotestosterone. 06/11/15  Yes [name], PA-C  potassium chloride SA (  K-DUR,KLOR-CON) 20 MEQ tablet Take 2 tablets (40 mEq total) by mouth daily. Patient taking differently: Take 20 mEq by mouth 2 (two) times daily.  03/11/16  Yes [name], MD  sildenafil (REVATIO) 20 MG tablet Take 5 tablets daily as needed for erectile dysfunction. 04/13/14  Yes [name], MD  Syringe, Reusable, 3 ML MISC Use to inject depotestosterone. 06/11/15  Yes [name], PA-C  testosterone cypionate (DEPOTESTOTERONE CYPIONATE) 100 MG/ML injection Inject 1 mL (100 mg total) into the muscle every 7 (seven) days. For IM use only 01/07/16  Yes [name], MD     ROS: The patient denies fevers, chills, night sweats, unintentional weight loss, chest pain, palpitations, wheezing, dyspnea on exertion, nausea, vomiting, abdominal pain, dysuria, hematuria, melena, numbness, weakness, or tingling. Pt is positive for left shoulder pain.  All other systems have been reviewed and were otherwise negative with the exception of those mentioned in the HPI and as above.    PHYSICAL EXAM: Vitals:   07/23/16 1025  BP: 138/80  Pulse: 79  Resp: 16  Temp: 98.1 F (36.7 C)   Body mass index is 35.74 kg/m.   General: Alert, no acute distress HEENT:  Normocephalic, atraumatic, oropharynx patent. Eye: [facility] Cardiovascular:  Regular rate and rhythm, no rubs murmurs or gallops.  No Carotid bruits, radial pulse intact. No pedal edema.  Respiratory: Clear to auscultation bilaterally.  No wheezes, rales, or rhonchi.  No cyanosis, no use of accessory musculature Abdominal: No  organomegaly, abdomen is soft and non-tender, positive bowel sounds.  No masses. Musculoskeletal: Gait intact. No edema, tenderness Skin: No rashes. Pt has an old healing bruise on rt cheek from a fall. Neurologic: Facial musculature symmetric. Psychiatric: Patient acts appropriately throughout our interaction. Lymphatic: No cervical or submandibular lymphadenopathy    LABS:    EKG/XRAY:   Primary read interpreted by Dr. [facility]lene Farrier at [facility].  Dg Shoulder Left  Result Date: 07/23/2016 CLINICAL DATA:  Generalized left shoulder pain 3 weeks.  No injury. EXAM: LEFT SHOULDER - 2+ VIEW COMPARISON:  Chest x-ray 05/19/2016 FINDINGS: There are Mach degenerative changes of the glenohumeral joint and mild degenerate change of the [facility] joint. No acute fracture or dislocation. IMPRESSION: No acute findings. Degenerative changes as described glenohumeral joint worse in the [facility] joint. Electronically Signed   By: Marin Olp M.D.   On: 07/23/2016 11:48     ASSESSMENT/PLAN: I told him he will need to choose a PCP. I did refill his meds.He was pulled up on the [place] controlled reporting system. He knows he cannot take  lorazepam with hydrocodone. I did refill his pain medicines for the month. I did not refill his sleep aid. I told him he has Neurontin for sleep. Referrals were made to dermatology ophthalmology and to referrals for neurology 1 for general neurological evaluation of cognitive decline in the second to see Dr. Brett Fairy regarding the status of his CPAP machine and whether he it is on the correct settings. He continues to be very depressed. I told him to make a time I could see him next week and we would only address depression.I personally performed the services described in this documentation, which was scribed in my presence. The recorded information has been reviewed and is accurate. I also gave him information regarding [facility] is controlled substance policy for his records.I personally performed the  services described in this documentation, which was scribed in my presence. The recorded information has been reviewed and is  accurate. He was asking about hep A and hep B vaccines. He has had hepatitis B vaccines in the past. I tried to order an A vaccine but it was not approved under Medicare. He declined to pay  for the vaccine at the present time.I personally performed the services described in this documentation, which was scribed in my presence. The recorded information has been reviewed and is accurate.   Gross sideeffects, risk and benefits, and alternatives of medications d/w patient. Patient is aware that all medications have potential sideeffects and we are unable to predict every sideeffect or drug-drug interaction that may occur.  [name] MD 07/23/2016 11:00 AM

## 2016-07-23 NOTE — Patient Instructions (Addendum)
[facility] Policy for Prescribing Controlled Substances (Revised 09/2012) 1. Prescriptions for controlled substances will be filled by ONE provider at [facility] with whom you have established and developed a plan for your care, including follow-up. 2. You are encouraged to schedule an appointment with your prescriber at our appointment center for follow-up visits whenever possible. 3. If you request a prescription for the controlled substance while at [facility] for an acute problem (with someone other than your regular prescriber), you MAY be given a ONE-TIME prescription for a 30-day supply of the controlled substance, to allow time for you to return to see your regular prescriber for additional prescriptions.     IF you received an x-ray today, you will receive an invoice from [facility]. Please contact [facility] at [phone] with questions or concerns regarding your invoice.   IF you received labwork today, you will receive an invoice from [facility]. Please contact [facility] at [phone] with questions or concerns regarding your invoice.   Our billing staff will not be able to assist you with questions regarding bills from these companies.  You will be contacted with the lab results as soon as they are available. The fastest way to get your results is to activate your My Chart account. Instructions are located on the last page of this paperwork. If you have not heard from us regarding the results in 2 weeks, please contact this office.      

## 2016-07-23 NOTE — Telephone Encounter (Signed)
Patient came in for evaluation 

## 2016-07-25 ENCOUNTER — Other Ambulatory Visit: Payer: Self-pay | Admitting: Physician Assistant

## 2016-07-31 ENCOUNTER — Ambulatory Visit (INDEPENDENT_AMBULATORY_CARE_PROVIDER_SITE_OTHER): Payer: Medicare Other | Admitting: Emergency Medicine

## 2016-07-31 VITALS — BP 126/82 | HR 86 | Temp 98.1°F | Resp 18 | Ht 69.0 in | Wt 243.0 lb

## 2016-07-31 DIAGNOSIS — F32A Depression, unspecified: Secondary | ICD-10-CM

## 2016-07-31 DIAGNOSIS — M5442 Lumbago with sciatica, left side: Secondary | ICD-10-CM

## 2016-07-31 DIAGNOSIS — M5441 Lumbago with sciatica, right side: Secondary | ICD-10-CM | POA: Diagnosis not present

## 2016-07-31 DIAGNOSIS — F411 Generalized anxiety disorder: Secondary | ICD-10-CM

## 2016-07-31 DIAGNOSIS — F329 Major depressive disorder, single episode, unspecified: Secondary | ICD-10-CM | POA: Diagnosis not present

## 2016-07-31 DIAGNOSIS — Z23 Encounter for immunization: Secondary | ICD-10-CM | POA: Diagnosis not present

## 2016-07-31 DIAGNOSIS — G4733 Obstructive sleep apnea (adult) (pediatric): Secondary | ICD-10-CM | POA: Diagnosis not present

## 2016-07-31 NOTE — Progress Notes (Addendum)
Subjective:  This chart was scribed for [name] MD, by Tamsen Roers, at [facility].  This patient was seen in room 12 and the patient's care was started at 9:56 AM.   Chief Complaint  Patient presents with  . Follow-up     Patient ID: [name], male    DOB: 05/23/1941, 75 y.o.   MRN: [id number]  HPI HPI Comments: [name] is a 75 y.o. male with a history of depression who presents to the [facility] for a follow up.   Patient does not have a partner at the moment and states that he is too "independent" for a relationship.  His children are gone and no longer live at home.  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  He enjoys what he does at work but feels that it is making him very exhausted (especially when he doesn't have any clients and feels unproductive).  He feels like when he starts getting tired, his back starts hurting more.  Patient was doing PT for his knee (about a year ago) but does not go anymore.  Patient would like to start an exercise program so he can lose 30 pounds (which is his goal).  He started swimming in the summer and enjoyed this as a form of exercise.  Patient does not eat much at work but does eat junk food when he gets home and starts snacking.  He does not cook and would like to create a better regime for himself.    Patient is not able to sell his home as it on a storm drain and it is not worth anything where he can make a profit.    Patient is requesting a note in order for him to keep his CPAP machine.  He is happy with using it.  Patient has 4 grandchildren.    Patient Active Problem List   Diagnosis Date Noted  . Umbilical hernia s/p lap repair with mesh 06/27/2016 06/27/2016  . Degenerative arthritis of knee 03/22/2015  . Dyspnea on exertion 01/31/2015  . Lumbar  radiculopathy 12/12/2014  . Facet hypertrophy of lumbar region 12/12/2014  . Left knee pain 12/12/2014  . Ulcer of lower limb ([place]) 01/10/2014  . Varicose veins of lower extremities with other complications [id number]  . 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 Renal insufficiency 12/21/2013  . Swelling in head/neck 07/28/2012  . Venous (peripheral) insufficiency 05/25/2012  . 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 Pain in limb 02/12/2012  . Hyperlipidemia 02/28/2008  . Allergic rhinitis 02/28/2008  . Sleep apnea 02/28/2008   Past Medical History:  Diagnosis Date  . Allergy    takes Zyrtec daily  . Anxiety    takes Ativan daily as needed  . Arthritis   . Back pain   . BPH (benign prostatic hyperplasia)    takes doxazosin for  . 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 Depression    takes Wellbutrin daily  . Dyspnea on exertion    with exertion  . GERD (gastroesophageal reflux disease)    takes Nexium and Omeprazole daily  . History of kidney stones   . Hx of [facility] spotted fever childhood  . Hyperlipidemia   . Neuromuscular disorder ([place])   .  Sleep apnea   . Swelling of lower extremity    right more than leg leg  . Umbilical hernia    Past Surgical History:  Procedure Laterality Date  . 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 BACK SURGERY  2003   L4, L5  . BACK SURGERY    . ENDOVENOUS ABLATION SAPHENOUS VEIN W/ LASER Right 06-01-2014   EVLA right greater saphenous vein by Curt Jews MD   . epidural steroid injections        [facility]  . hernia repair  2003  . HERNIA REPAIR     hernia inguinal x 2  . TOTAL KNEE ARTHROPLASTY Left 03/22/2015   Procedure: TOTAL KNEE ARTHROPLASTY;  Surgeon: Meredith Pel, MD;  Location: [place];  Service: Orthopedics;  Laterality: Left;  . UMBILICAL HERNIA REPAIR N/A 06/27/2016   Procedure: LAPAROSCOPIC ASSISTED REPAIR OF UMBILICAL HERINA WITH  MESH;  Surgeon: Johnathan Hausen, MD;  [facility];  Service: General;  Laterality: N/A;  . VASCULAR SURGERY  2015   right leg   No Known Allergies Prior to Admission medications   Medication Sig Start Date End Date Taking? Authorizing Provider  B-D 3CC LUER-LOK SYR 21GX1-1/2 21G X 1-1/2" 3 ML MISC USE TO INJECT DEPOTESTOSTERONE. 07/28/16   [name], MD  buPROPion (WELLBUTRIN XL) 300 MG 24 hr tablet TAKE 1 TABLET BY MOUTH DAILY *NEEDS OFFICE VISIT 03/11/16   [name], MD  calcium carbonate 200 MG capsule Take 200 mg by mouth daily.     Historical Provider, MD  cetirizine (ZYRTEC) 10 MG tablet Take 10 mg by mouth every morning.     Historical Provider, MD  doxazosin (CARDURA) 8 MG tablet TAKE 1 TABLET BY MOUTH DAILY  . Take at night Patient taking differently: every evening. TAKE 1 TABLET BY MOUTH DAILY  . Take at night 03/11/16   [name], MD  esomeprazole (NEXIUM) 20 MG capsule Take 1 capsule (20 mg total) by mouth daily. 07/23/16   [name], MD  flurazepam [facility]) 15 MG capsule Take 1 capsule (15 mg total) by mouth at bedtime as needed for sleep. 03/11/16   [name], MD  fluticasone (FLONASE) 50 MCG/ACT nasal spray Place 2 sprays into both nostrils daily. 03/11/16   [name], MD  furosemide (LASIX) 40 MG tablet Take 1 tablet (40 mg total) by mouth daily. 03/11/16   [name], MD  gabapentin (NEURONTIN) 300 MG capsule Take 1-2 capsules at bedtime 07/23/16   [name], MD  glucosamine-chondroitin 500-400 MG tablet Take 1 tablet by mouth 2 (two) times daily.    Historical Provider, MD  HYDROcodone-acetaminophen [facility]) 5-325 MG tablet 1 tab every 8 hours 07/23/16   [name], MD  LORazepam (ATIVAN) 0.5 MG tablet Take 1 tablet as needed for stress on a daily basis. Not to be taken with pain meds 07/23/16   [name], MD  meloxicam (MOBIC) 7.5 MG tablet TAKE 1-2 TABLETS BY MOUTH DAILY 07/23/16   [name], MD  methocarbamol (ROBAXIN) 500 MG tablet Take 1  tablet (500 mg total) by mouth every 6 (six) hours as needed for muscle spasms. 03/11/16   [name], MD  Multiple Vitamin (MULTIVITAMIN) tablet Take 1 tablet by mouth daily.    Historical Provider, MD  NEEDLE, DISP, 21 G 21G X 1-1/2" MISC Use to inject depotestosterone. 06/11/15   Chelle  Jeffery, PA-C  potassium chloride SA (K-DUR,KLOR-CON) 20 MEQ tablet Take 1 tablet (20 mEq total) by mouth 2 (two) times daily. 07/23/16   [name], MD  sildenafil (REVATIO) 20 MG tablet Take 5 tablets daily as needed for erectile dysfunction. 04/13/14   [name], MD  Syringe, Reusable, 3 ML MISC Use to inject depotestosterone. 06/11/15   [name], PA-C  testosterone cypionate (DEPOTESTOTERONE CYPIONATE) 100 MG/ML injection Inject 1 mL (100 mg total) into the muscle every 7 (seven) days. For IM use only 01/07/16   [name], MD   Social History   Social History  . Marital status: Single    Spouse name: N/A  . Number of children: N/A  . Years of education: N/A   Occupational History  . Antiques/Real Estate Retired   Social History Main Topics  . Smoking status: Never Smoker  . Smokeless tobacco: Never Used  . Alcohol use No  . Drug use: No  . Sexual activity: Yes    Birth control/ protection: Condom   Other Topics Concern  . Not on file   Social History Narrative   ** Merged History Encounter **       Single. Education: Other.       Review of Systems  Constitutional: Negative for chills and fever.  Eyes: Negative for pain, redness and itching.  Musculoskeletal: Positive for back pain. Negative for neck pain and neck stiffness.       Objective:   Physical Exam Vitals:   07/31/16 0949  BP: 126/82  Pulse: 86  Resp: 18  Temp: 98.1 F (36.7 C)  TempSrc: Oral  SpO2: 95%  Weight: 243 lb (110.2 kg)  Height: 5\' 9"  (1.753 m)    CONSTITUTIONAL: Well developed/well nourished HEAD: Normocephalic/atraumatic EYES: EOMI/PERRL CV: S1/S2 noted, no murmurs/rubs/gallops  noted LUNGS: Lungs are clear to auscultation bilaterally, no apparent distress NEURO: Pt is awake/alert/appropriate, moves all extremitiesx4.  No facial droop.   EXTREMITIES: pulses normal/equal, full ROM SKIN: warm, color normal PSYCH: no abnormalities of mood noted, alert and oriented to situation      Assessment & Plan:  We had a long discussion about things he could do to decrease his depression. I encouraged him to be active with family. I encouraged him to work on weight loss. I encouraged him to go speak with the [facility] and see if he qualifies under silver sneakers. If he does not qualify I will be happy to refer him to physical therapy to work on strengthening core muscles and strengthening of his lower extremities. He is deconditioned. He will continue with his CPAP machine. Letter was written so he can obtain a new machine.I personally performed the services described in this documentation, which was scribed in my presence. The recorded information has been reviewed and is accurate.Patient given hepatitis A vaccine. He has concerns about exposure to coworkers where he works.  [name], MD

## 2016-07-31 NOTE — Patient Instructions (Signed)
     IF you received an x-ray today, you will receive an invoice from [facility]. Please contact [facility] at [phone] with questions or concerns regarding your invoice.   IF you received labwork today, you will receive an invoice from [facility]. Please contact [facility] at [phone] with questions or concerns regarding your invoice.   Our billing staff will not be able to assist you with questions regarding bills from these companies.  You will be contacted with the lab results as soon as they are available. The fastest way to get your results is to activate your My Chart account. Instructions are located on the last page of this paperwork. If you have not heard from us regarding the results in 2 weeks, please contact this office.      

## 2016-08-06 DIAGNOSIS — Z96652 Presence of left artificial knee joint: Secondary | ICD-10-CM | POA: Diagnosis not present

## 2016-08-06 DIAGNOSIS — M25561 Pain in right knee: Secondary | ICD-10-CM | POA: Diagnosis not present

## 2016-08-06 DIAGNOSIS — M545 Low back pain: Secondary | ICD-10-CM | POA: Diagnosis not present

## 2016-08-11 ENCOUNTER — Ambulatory Visit (INDEPENDENT_AMBULATORY_CARE_PROVIDER_SITE_OTHER): Payer: Medicare Other | Admitting: Orthopedic Surgery

## 2016-08-11 DIAGNOSIS — H35371 Puckering of macula, right eye: Secondary | ICD-10-CM | POA: Diagnosis not present

## 2016-08-11 DIAGNOSIS — H2512 Age-related nuclear cataract, left eye: Secondary | ICD-10-CM | POA: Diagnosis not present

## 2016-08-11 DIAGNOSIS — H25813 Combined forms of age-related cataract, bilateral: Secondary | ICD-10-CM | POA: Diagnosis not present

## 2016-08-13 DIAGNOSIS — L309 Dermatitis, unspecified: Secondary | ICD-10-CM | POA: Diagnosis not present

## 2016-08-13 DIAGNOSIS — D3617 Benign neoplasm of peripheral nerves and autonomic nervous system of trunk, unspecified: Secondary | ICD-10-CM | POA: Diagnosis not present

## 2016-08-13 DIAGNOSIS — C44319 Basal cell carcinoma of skin of other parts of face: Secondary | ICD-10-CM | POA: Diagnosis not present

## 2016-08-13 DIAGNOSIS — L570 Actinic keratosis: Secondary | ICD-10-CM | POA: Diagnosis not present

## 2016-08-13 DIAGNOSIS — D485 Neoplasm of uncertain behavior of skin: Secondary | ICD-10-CM | POA: Diagnosis not present

## 2016-08-13 DIAGNOSIS — D2111 Benign neoplasm of connective and other soft tissue of right upper limb, including shoulder: Secondary | ICD-10-CM | POA: Diagnosis not present

## 2016-08-14 ENCOUNTER — Encounter (INDEPENDENT_AMBULATORY_CARE_PROVIDER_SITE_OTHER): Payer: Medicare Other | Admitting: Physical Medicine and Rehabilitation

## 2016-08-14 DIAGNOSIS — M48061 Spinal stenosis, lumbar region without neurogenic claudication: Secondary | ICD-10-CM | POA: Diagnosis not present

## 2016-08-19 ENCOUNTER — Encounter: Payer: Self-pay | Admitting: Physician Assistant

## 2016-08-19 ENCOUNTER — Other Ambulatory Visit (INDEPENDENT_AMBULATORY_CARE_PROVIDER_SITE_OTHER): Payer: Self-pay | Admitting: Physical Medicine and Rehabilitation

## 2016-08-19 DIAGNOSIS — M542 Cervicalgia: Secondary | ICD-10-CM

## 2016-08-19 DIAGNOSIS — C4491 Basal cell carcinoma of skin, unspecified: Secondary | ICD-10-CM | POA: Insufficient documentation

## 2016-08-27 ENCOUNTER — Other Ambulatory Visit: Payer: Medicare Other

## 2016-08-30 ENCOUNTER — Ambulatory Visit
Admission: RE | Admit: 2016-08-30 | Discharge: 2016-08-30 | Disposition: A | Discharge status: Home / Self Care | Payer: Medicare Other | Source: Ambulatory Visit | Attending: Physical Medicine and Rehabilitation | Admitting: Physical Medicine and Rehabilitation

## 2016-08-30 DIAGNOSIS — M542 Cervicalgia: Secondary | ICD-10-CM | POA: Diagnosis not present

## 2016-09-11 ENCOUNTER — Encounter: Payer: Self-pay | Admitting: Emergency Medicine

## 2016-09-18 DIAGNOSIS — C44319 Basal cell carcinoma of skin of other parts of face: Secondary | ICD-10-CM | POA: Diagnosis not present

## 2016-09-18 DIAGNOSIS — L988 Other specified disorders of the skin and subcutaneous tissue: Secondary | ICD-10-CM | POA: Diagnosis not present

## 2016-09-25 DIAGNOSIS — Z4802 Encounter for removal of sutures: Secondary | ICD-10-CM | POA: Diagnosis not present

## 2016-10-13 ENCOUNTER — Telehealth: Payer: Self-pay

## 2016-10-13 DIAGNOSIS — F411 Generalized anxiety disorder: Secondary | ICD-10-CM

## 2016-10-13 NOTE — Telephone Encounter (Signed)
Patient is calling because the pharmacy faxed [place] a refill request for lorazepam and it's been over a week. Patient asked if it can possibly be filled today since he's been waiting for a while. Please advise!   [phone]

## 2016-10-13 NOTE — Telephone Encounter (Signed)
Benzodiazepines with hydrocodone is not appropriate.  No refills of benzo now.  He will need to see a provider who is comfortable with coadministration. Philis Fendt, MS, PA-C 7:55 PM, 10/13/2016

## 2016-10-13 NOTE — Telephone Encounter (Signed)
We did not receive a req from pharm over a week ago, but did get one a few days ago. Rx was printed at 9/13 OV for #30 + 5 RFs of lorazepam along with a Rx for hydrocodone. I called [facility] and they pulled the hard copy and the only Rx on it is for hydrocodone. LMOM for pt to CB to advise whether he may still have the lorazepam Rx at home.

## 2016-10-16 NOTE — Telephone Encounter (Signed)
lmtcb to discuss current use og hydrocodone and alprazolam.

## 2016-10-29 ENCOUNTER — Telehealth: Payer: Self-pay

## 2016-10-29 NOTE — Telephone Encounter (Signed)
I have reviewed the chart and the reduction in dose was intentional and Dr. Everlene Farrier is trying to reduce the risk of a bad outcome by reducing his dose.  I can discuss this with him (Dr. Everlene Farrier) but he has been reducing/stopping benzos on all of his chronic pain patients.  I will discuss the possibility of an early refill however please make patient aware this may not occur. Philis Fendt, MS, PA-C 9:30 PM, 10/29/2016

## 2016-10-29 NOTE — Telephone Encounter (Signed)
Pt called, he had not received VM that Santiago Glad left. Discussed problem with taking lorazepam and hydrocodone and how many providers will not be comfortable Rxing these together. Pt stated there is a problem with the last Rxs Dr Everlene Farrier wrote in Sept. He did find the Rx with his ppw, but the directions changed to just once a day. He stated that it used to be up to twice daily prn, and that this limits how many or often the pharm can refill his lorazepam. Pt would like to come in and see Dr Everlene Farrier next time he is in, but he has a balance. Larene Beach is checking w/Nikki so that she can give pt this info.  Dr Everlene Farrier, in case pt can not come in, can you check on correct dosing for Rx of lorazepam? Did you intend to decrease his dose or was this unintentional?

## 2016-10-29 NOTE — Telephone Encounter (Signed)
Pt needs to speak with dr. Everlene Farrier about his medications he would like. Dr. Everlene Farrier to call him at his earliest convenience   [phone]

## 2016-10-30 NOTE — Telephone Encounter (Signed)
Dr Everlene Farrier, I will forward this to you, but please see all notes under 10/13/16 for info about what pt is calling about.

## 2016-10-31 ENCOUNTER — Telehealth: Payer: Self-pay | Admitting: Emergency Medicine

## 2016-10-31 ENCOUNTER — Ambulatory Visit (INDEPENDENT_AMBULATORY_CARE_PROVIDER_SITE_OTHER): Payer: Medicare Other | Admitting: Urgent Care

## 2016-10-31 ENCOUNTER — Telehealth: Payer: Self-pay

## 2016-10-31 VITALS — BP 136/80 | HR 74 | Temp 97.9°F | Resp 18 | Ht 69.0 in | Wt 246.0 lb

## 2016-10-31 DIAGNOSIS — Z7189 Other specified counseling: Secondary | ICD-10-CM

## 2016-10-31 NOTE — Telephone Encounter (Signed)
Patient called and was advised he needed to return to clinic to discuss the appropriate medication she he should be on to be safe. Message left on his answering machine. Hopefully he will decide to come in and discuss this

## 2016-10-31 NOTE — Progress Notes (Signed)
Patient left without being seen. Dr. Everlene Farrier called patient and discussed need to establish PCP and continue working on weaning off of Xanax.

## 2016-10-31 NOTE — Progress Notes (Deleted)
    MRN: [id number] DOB: 1941/09/05  Subjective:   [name] is a 75 y.o. male presenting for chief complaint of Medication Problem (xanax)     [name] has a current medication list which includes the following prescription(s): b-d 3cc luer-lok syr 21gx1-1/2, bupropion, calcium carbonate, cetirizine, doxazosin, esomeprazole, flurazepam, fluticasone, furosemide, gabapentin, glucosamine-chondroitin, hydrocodone-acetaminophen, lorazepam, meloxicam, methocarbamol, multivitamin, needle (disp) 21 g, potassium chloride sa, sildenafil, syringe (reusable), and testosterone cypionate. Also has No Known Allergies.  [name]  has a past medical history of Allergy; Anxiety; Arthritis; Back pain; BPH (benign prostatic hyperplasia); Degenerative disc disease (15 years); Depression; Dyspnea on exertion; GERD (gastroesophageal reflux disease); History of kidney stones; [facility] spotted fever (childhood); Hyperlipidemia; Neuromuscular disorder ([place]); Sleep apnea; Swelling of lower extremity; and Umbilical hernia. Also  has a past surgical history that includes epidural steroid injections; hernia repair (2003); Endovenous ablation saphenous vein w/ laser (Right, 06-01-2014); Amputation finger; Total knee arthroplasty (Left, 03/22/2015); Vascular surgery (2015); Back surgery (2003); Back surgery; Hernia repair; and Umbilical hernia repair (N/A, 06/27/2016).  Objective:   Vitals: BP 136/80 (BP Location: Right Arm, Patient Position: Sitting, Cuff Size: Large)   Pulse 74   Temp 97.9 F (36.6 C) (Oral)   Resp 18   Ht 5\' 9"  (1.753 m)   Wt 246 lb (111.6 kg)   SpO2 96%   BMI 36.33 kg/m   Physical Exam  No results found for this or any previous visit (from the past 24 hour(s)).  Assessment and Plan :     [name] Eagles, PA-C [facility] [phone] 10/31/2016 11:57 AM

## 2016-10-31 NOTE — Telephone Encounter (Signed)
I called and spoke with Mr. [name]. I told him it was essential that he come in and sit down with a provider and clarify the exact instructions on his prescription. He also had some billing questions and I advised him he could discuss this with the billing department at same time. I told him I was not writing long-term prescriptions for my former patients since I left October 1. He was agreeable to make an appointment to see one of the other providers to make sure his medications are accurate.

## 2016-10-31 NOTE — Telephone Encounter (Signed)
fyi

## 2016-10-31 NOTE — Patient Instructions (Signed)
     IF you received an x-ray today, you will receive an invoice from [facility]. Please contact [facility] at [phone] with questions or concerns regarding your invoice.   IF you received labwork today, you will receive an invoice from [facility]. Please contact [facility] at [phone] with questions or concerns regarding your invoice.   Our billing staff will not be able to assist you with questions regarding bills from these companies.  You will be contacted with the lab results as soon as they are available. The fastest way to get your results is to activate your My Chart account. Instructions are located on the last page of this paperwork. If you have not heard from us regarding the results in 2 weeks, please contact this office.     

## 2016-10-31 NOTE — Telephone Encounter (Signed)
Pt called stating that he had a missed call from Dr Everlene Farrier. Would like a call back please.

## 2016-10-31 NOTE — Telephone Encounter (Signed)
Discussed case with Dr. Everlene Farrier. Patient needs an office visit to discuss tapering off of Xanax.

## 2016-12-09 DIAGNOSIS — D125 Benign neoplasm of sigmoid colon: Secondary | ICD-10-CM | POA: Diagnosis not present

## 2016-12-09 DIAGNOSIS — Z1211 Encounter for screening for malignant neoplasm of colon: Secondary | ICD-10-CM | POA: Diagnosis not present

## 2016-12-09 DIAGNOSIS — D120 Benign neoplasm of cecum: Secondary | ICD-10-CM | POA: Diagnosis not present

## 2016-12-09 DIAGNOSIS — K635 Polyp of colon: Secondary | ICD-10-CM | POA: Diagnosis not present

## 2016-12-09 DIAGNOSIS — K5730 Diverticulosis of large intestine without perforation or abscess without bleeding: Secondary | ICD-10-CM | POA: Diagnosis not present

## 2016-12-11 ENCOUNTER — Telehealth: Payer: Self-pay | Admitting: Cardiovascular Disease

## 2016-12-11 NOTE — Telephone Encounter (Signed)
Received records from [facility] for appointment on 12/30/16 with Dr Gwenlyn Found.  Records put with Dr Kennon Holter schedule for 12/30/16. lp

## 2016-12-12 ENCOUNTER — Telehealth: Payer: Self-pay | Admitting: *Deleted

## 2016-12-12 NOTE — Telephone Encounter (Signed)
Open in error

## 2016-12-19 ENCOUNTER — Other Ambulatory Visit: Payer: Self-pay | Admitting: Emergency Medicine

## 2016-12-20 NOTE — Telephone Encounter (Signed)
Last ov 10/29/16

## 2016-12-22 DIAGNOSIS — R197 Diarrhea, unspecified: Secondary | ICD-10-CM | POA: Diagnosis not present

## 2016-12-22 DIAGNOSIS — R1032 Left lower quadrant pain: Secondary | ICD-10-CM | POA: Diagnosis not present

## 2016-12-22 DIAGNOSIS — M545 Low back pain: Secondary | ICD-10-CM | POA: Diagnosis not present

## 2016-12-22 NOTE — Telephone Encounter (Signed)
Patient is due for follow. Please let him know that we sent a courtesy refill and that he needs to set up follow up with any provider here.

## 2016-12-23 ENCOUNTER — Encounter: Payer: Self-pay | Admitting: Cardiovascular Disease

## 2016-12-23 ENCOUNTER — Ambulatory Visit (INDEPENDENT_AMBULATORY_CARE_PROVIDER_SITE_OTHER): Payer: Medicare Other | Admitting: Cardiovascular Disease

## 2016-12-23 VITALS — BP 153/81 | HR 69 | Ht 70.0 in | Wt 251.6 lb

## 2016-12-23 DIAGNOSIS — E7800 Pure hypercholesterolemia, unspecified: Secondary | ICD-10-CM | POA: Diagnosis not present

## 2016-12-23 DIAGNOSIS — R0609 Other forms of dyspnea: Secondary | ICD-10-CM

## 2016-12-23 DIAGNOSIS — R0602 Shortness of breath: Secondary | ICD-10-CM | POA: Diagnosis not present

## 2016-12-23 DIAGNOSIS — G4730 Sleep apnea, unspecified: Secondary | ICD-10-CM | POA: Diagnosis not present

## 2016-12-23 DIAGNOSIS — R0600 Dyspnea, unspecified: Secondary | ICD-10-CM

## 2016-12-23 NOTE — Assessment & Plan Note (Signed)
History of hyperlipidemia not on statin therapy followed by his most recent lipid profile performed 03/11/16 revealed total cholesterol 175, LDL 97 and HDL of 38.

## 2016-12-23 NOTE — Assessment & Plan Note (Signed)
History of dyspnea on exertion worse in the recent past. I did perform cardiac catheterization on him 06/17/07 which was entirely normal. EF has been normal by 2-D echocardiogram exercise/1/16. We will repeat a 2-D echo and perform carotid Myoview stress test.

## 2016-12-23 NOTE — Patient Instructions (Signed)
Medication Instructions: Your physician recommends that you continue on your current medications as directed. Please refer to the Current Medication list given to you today.   Testing/Procedures: Your physician has requested that you have an echocardiogram. Echocardiography is a painless test that uses sound waves to create images of your heart. It provides your doctor with information about the size and shape of your heart and how well your heart's chambers and valves are working. This procedure takes approximately one hour. There are no restrictions for this procedure.  Your physician has requested that you have a lexiscan myoview. For further information please visit [email or web address]. Please follow instruction sheet, as given.  Follow-Up: Your physician recommends that you schedule a follow-up appointment after testing is completed.   Any Other Special Instructions will be listed below:  Pharmacologic Stress Electrocardiogram A pharmacologic stress electrocardiogram is a heart (cardiac) test that uses nuclear imaging to evaluate the blood supply to your heart. This test may also be called a pharmacologic stress electrocardiography. Pharmacologic means that a medicine is used to increase your heart rate and blood pressure.  This stress test is done to find areas of poor blood flow to the heart by determining the extent of coronary artery disease (CAD). Some people exercise on a treadmill, which naturally increases the blood flow to the heart. For those people unable to exercise on a treadmill, a medicine is used. This medicine stimulates your heart and will cause your heart to beat harder and more quickly, as if you were exercising.  Pharmacologic stress tests can help determine:  The adequacy of blood flow to your heart during increased levels of activity in order to clear you for discharge home.  The extent of coronary artery blockage caused by CAD.  Your prognosis if you have suffered  a heart attack.  The effectiveness of cardiac procedures done, such as an angioplasty, which can increase the circulation in your coronary arteries.  Causes of chest pain or pressure. LET [facility] CARE PROVIDER KNOW ABOUT:  Any allergies you have.  All medicines you are taking, including vitamins, herbs, eye drops, creams, and over-the-counter medicines.  Previous problems you or members of your family have had with the use of anesthetics.  Any blood disorders you have.  Previous surgeries you have had.  Medical conditions you have.  Possibility of pregnancy, if this applies.  If you are currently breastfeeding. RISKS AND COMPLICATIONS Generally, this is a safe procedure. However, as with any procedure, complications can occur. Possible complications include:  You develop pain or pressure in the following areas:  Chest.  Jaw or neck.  Between your shoulder blades.  Radiating down your left arm.  Headache.  Dizziness or light-headedness.  Shortness of breath.  Increased or irregular heartbeat.  Low blood pressure.  Nausea or vomiting.  Flushing.  Redness going up the arm and slight pain during injection of medicine.  Heart attack (rare). BEFORE THE PROCEDURE   Avoid all forms of caffeine for 24 hours before your test or as directed by your health care provider. This includes coffee, tea (even decaffeinated tea), caffeinated sodas, chocolate, cocoa, and certain pain medicines.  Follow your health care provider's instructions regarding eating and drinking before the test.  Take your medicines as directed at regular times with water unless instructed otherwise. Exceptions may include:  If you have diabetes, ask how you are to take your insulin or pills. It is common to adjust insulin dosing the morning of the test.  If you  are taking beta-blocker medicines, it is important to talk to your health care provider about these medicines well before the date of  your test. Taking beta-blocker medicines may interfere with the test. In some cases, these medicines need to be changed or stopped 24 hours or more before the test.  If you wear a nitroglycerin patch, it may need to be removed prior to the test. Ask your health care provider if the patch should be removed before the test.  If you use an inhaler for any breathing condition, bring it with you to the test.  If you are an outpatient, bring a snack so you can eat right after the stress phase of the test.  Do not smoke for 4 hours prior to the test or as directed by your health care provider.  Do not apply lotions, powders, creams, or oils on your chest prior to the test.  Wear comfortable shoes and clothing. Let your health care provider know if you were unable to complete or follow the preparations for your test. PROCEDURE   Multiple patches (electrodes) will be put on your chest. If needed, small areas of your chest may be shaved to get better contact with the electrodes. Once the electrodes are attached to your body, multiple wires will be attached to the electrodes, and your heart rate will be monitored.  An IV access will be started. A nuclear trace (isotope) is given. The isotope may be given intravenously, or it may be swallowed. Nuclear refers to several types of radioactive isotopes, and the nuclear isotope lights up the arteries so that the nuclear images are clear. The isotope is absorbed by your body. This results in low radiation exposure.  A resting nuclear image is taken to show how your heart functions at rest.  A medicine is given through the IV access.  A second scan is done about 1 hour after the medicine injection and determines how your heart functions under stress.  During this stress phase, you will be connected to an electrocardiogram machine. Your blood pressure and oxygen levels will be monitored. AFTER THE PROCEDURE   Your heart rate and blood pressure will be  monitored after the test.  You may return to your normal schedule, including diet,activities, and medicines, unless your health care provider tells you otherwise. This information is not intended to replace advice given to you by your health care provider. Make sure you discuss any questions you have with your health care provider. Document Released: 03/15/2009 Document Revised: 11/01/2013 Document Reviewed: 07/04/2013 Elsevier Interactive Patient Education  2017 [employer].    If you need a refill on your cardiac medications before your next appointment, please call your pharmacy.

## 2016-12-23 NOTE — Progress Notes (Signed)
12/23/2016 [name]   10-18-1941  [id number]  Primary Physician No primary care provider on file. Primary Cardiologist: Lorretta Harp MD Renae Gloss  HPI:  [name] is a 76 year old moderately overweight divorced Caucasian male father of 3 living children (1 daughter committed suicide since I saw him last) who is a patient of [name] and was referred back for evaluation of dyspnea. I last saw him in the office 01/31/15. His cardiac risk factor profile is notable for mild hyperlipidemia and family history the father who had an open heart surgery in his 76s. Because of chest pain and a mildly abnormal Cardiolite I performed cardiac catheterization on him 8//7/08 which was entirely normal. He does have problems with his back and knee. He's had venous stripping by [name] for reflux and swelling last year. He's been diagnosed with obstructive sleep apnea where C Pap which is beneficial for him.  He does not exercise because of physical limitations regarding his back and knees. I saw him last 2 years ago. Over the last several months he relates increasing dyspnea on exertion.   Current Outpatient Prescriptions  Medication Sig Dispense Refill  . B-D 3CC LUER-LOK SYR 21GX1-1/2 21G X 1-1/2" 3 ML MISC USE TO INJECT DEPOTESTOSTERONE. 20 each 0  . buPROPion (WELLBUTRIN XL) 300 MG 24 hr tablet TAKE 1 TABLET BY MOUTH DAILY *NEEDS OFFICE VISIT 30 tablet 11  . calcium carbonate 200 MG capsule Take 200 mg by mouth daily.     . cetirizine (ZYRTEC) 10 MG tablet Take 10 mg by mouth every morning.     [name] doxazosin (CARDURA) 8 MG tablet TAKE 1 TABLET BY MOUTH DAILY  . Take at night (Patient taking differently: every evening. TAKE 1 TABLET BY MOUTH DAILY  . Take at night) 30 tablet 11  . esomeprazole (NEXIUM) 20 MG capsule Take 1 capsule (20 mg total) by mouth daily. 90 capsule 1  . flurazepam (DALMANE) 15 MG capsule Take 1 capsule (15 mg total) by mouth at bedtime as needed for sleep. 30  capsule 5  . fluticasone (FLONASE) 50 MCG/ACT nasal spray Place 2 sprays into both nostrils daily. 16 g 6  . furosemide (LASIX) 20 MG tablet TAKE 2 TABLETS BY MOUTH ONCE A DAY 60 tablet 0  . furosemide (LASIX) 40 MG tablet Take 1 tablet (40 mg total) by mouth daily. 30 tablet 11  . gabapentin (NEURONTIN) 300 MG capsule Take 1-2 capsules at bedtime 180 capsule 1  . glucosamine-chondroitin 500-400 MG tablet Take 1 tablet by mouth 2 (two) times daily.    [name] HYDROcodone-acetaminophen (NORCO) 5-325 MG tablet 1 tab every 8 hours 90 tablet 0  . LORazepam (ATIVAN) 0.5 MG tablet Take 1 tablet as needed for stress on a daily basis. Not to be taken with pain meds 30 tablet 5  . meloxicam (MOBIC) 7.5 MG tablet TAKE 1-2 TABLETS BY MOUTH DAILY 60 tablet 1  . methocarbamol (ROBAXIN) 500 MG tablet Take 1 tablet (500 mg total) by mouth every 6 (six) hours as needed for muscle spasms. 60 tablet 3  . Multiple Vitamin (MULTIVITAMIN) tablet Take 1 tablet by mouth daily.    [name] NEEDLE, DISP, 21 G 21G X 1-1/2" MISC Use to inject depotestosterone. 50 each 0  . potassium chloride SA (K-DUR,KLOR-CON) 20 MEQ tablet Take 1 tablet (20 mEq total) by mouth 2 (two) times daily. 18 tablet 3  . sildenafil (REVATIO) 20 MG tablet Take 5 tablets daily as  needed for erectile dysfunction. 50 tablet 11  . Syringe, Reusable, 3 ML MISC Use to inject depotestosterone. 1 each 0  . testosterone cypionate (DEPOTESTOTERONE CYPIONATE) 100 MG/ML injection Inject 1 mL (100 mg total) into the muscle every 7 (seven) days. For IM use only 10 mL 1   No current facility-administered medications for this visit.     No Known Allergies  Social History   Social History  . Marital status: Single    Spouse name: N/A  . Number of children: N/A  . Years of education: N/A   Occupational History  . [employer]/Real Estate Retired   Social History Main Topics  . Smoking status: Never Smoker  . Smokeless tobacco: Never Used  . Alcohol use No  . Drug  use: No  . Sexual activity: Yes    Birth control/ protection: Condom   Other Topics Concern  . Not on file   Social History Narrative   ** Merged History Encounter **       Single. Education: Other.      Review of Systems: General: negative for chills, fever, night sweats or weight changes.  Cardiovascular: negative for chest pain, dyspnea on exertion, edema, orthopnea, palpitations, paroxysmal nocturnal dyspnea or shortness of breath Dermatological: negative for rash Respiratory: negative for cough or wheezing Urologic: negative for hematuria Abdominal: negative for nausea, vomiting, diarrhea, bright red blood per rectum, melena, or hematemesis Neurologic: negative for visual changes, syncope, or dizziness All other systems reviewed and are otherwise negative except as noted above.    Blood pressure (!) 153/81, pulse 69, height 5\' 10"  (1.778 m), weight 251 lb 9.6 oz (114.1 kg).  General appearance: alert and no distress Neck: no adenopathy, no carotid bruit, no JVD, supple, symmetrical, trachea midline and thyroid not enlarged, symmetric, no tenderness/mass/nodules Lungs: clear to auscultation bilaterally Heart: regular rate and rhythm, S1, S2 normal, no murmur, click, rub or gallop Extremities: 1+ bilateral pitting lower extremity edema  EKG sinus rhythm at 69 without ST or T-wave changes. I personally reviewed this EKG  ASSESSMENT AND PLAN:   Hyperlipidemia History of hyperlipidemia not on statin therapy followed by his most recent lipid profile performed 03/11/16 revealed total cholesterol 175, LDL 97 and HDL of 38.  Sleep apnea History of obstructive sleep apnea improved with CPAP  Dyspnea on exertion History of dyspnea on exertion worse in the recent past. I did perform cardiac catheterization on him 06/17/07 which was entirely normal. EF has been normal by 2-D echocardiogram exercise/1/16. We will repeat a 2-D echo and perform carotid Myoview stress  test.      Lorretta Harp MD [facility], [facility] 12/23/2016 5:06 PM

## 2016-12-23 NOTE — Assessment & Plan Note (Signed)
History of obstructive sleep apnea improved with CPAP

## 2016-12-24 ENCOUNTER — Ambulatory Visit (HOSPITAL_COMMUNITY): Payer: Medicare Other

## 2016-12-29 ENCOUNTER — Ambulatory Visit (HOSPITAL_COMMUNITY): Payer: Medicare Other

## 2016-12-29 ENCOUNTER — Encounter: Payer: Self-pay | Admitting: Family Medicine

## 2016-12-29 DIAGNOSIS — D126 Benign neoplasm of colon, unspecified: Secondary | ICD-10-CM | POA: Insufficient documentation

## 2016-12-30 ENCOUNTER — Ambulatory Visit: Payer: Medicare Other | Admitting: Cardiovascular Disease

## 2016-12-31 ENCOUNTER — Telehealth: Payer: Self-pay | Admitting: Cardiovascular Disease

## 2016-12-31 ENCOUNTER — Telehealth (INDEPENDENT_AMBULATORY_CARE_PROVIDER_SITE_OTHER): Payer: Self-pay | Admitting: Radiology

## 2016-12-31 ENCOUNTER — Telehealth (INDEPENDENT_AMBULATORY_CARE_PROVIDER_SITE_OTHER): Payer: Self-pay | Admitting: Physical Medicine and Rehabilitation

## 2016-12-31 NOTE — Telephone Encounter (Signed)
Need return office visit before prescription we will try to avoid narcotic pain medicine if possible

## 2016-12-31 NOTE — Telephone Encounter (Signed)
Patient called and is requesting a refill norco/hydrocodone, please advise.  He saw you for knees, Duda for foot and Ernestina Patches for his back, last seen here in clinic on 08/14/2016 by Ernestina Patches.  Sharol Given saw him 08/11/2016 and you on 08/06/2016.

## 2017-01-01 NOTE — Telephone Encounter (Signed)
Can you please call patient and tell him what Dr Marlou Sa said?  And schedule a ROV for him?   See my note, depends on what is hurting him as to who he needs to see I think- call me with questions.  Thanks so much!!

## 2017-01-01 NOTE — Telephone Encounter (Signed)
Ok to repeat for lumbar issues, we also had ordered cervical MRI around that time, so if confusing then ov first

## 2017-01-02 NOTE — Telephone Encounter (Signed)
Scheduled for 01/13/17 at 300.

## 2017-01-05 ENCOUNTER — Ambulatory Visit (INDEPENDENT_AMBULATORY_CARE_PROVIDER_SITE_OTHER): Payer: Medicare Other | Admitting: Orthopedic Surgery

## 2017-01-05 DIAGNOSIS — M1711 Unilateral primary osteoarthritis, right knee: Secondary | ICD-10-CM

## 2017-01-05 MED ORDER — HYDROCODONE-ACETAMINOPHEN 5-325 MG PO TABS: ORAL_TABLET | ORAL | 0 refills | Status: DC

## 2017-01-05 NOTE — Progress Notes (Signed)
Office Visit Note   Patient: [name]           Date of Birth: 1940-11-29           MRN: [id number] Visit Date: 01/05/2017 Requested by: No referring provider defined for this encounter. PCP: No PCP Per Patient  Subjective: No chief complaint on file.   HPI [name] is a 76 year old patient with bilateral knee pain.  He's had left knee replacement is doing recently well also has right knee arthritis.  Would like to have a steroid injection into that knee.  Try tramadol nonsteroidals which doesn't help.  He takes occasional hydrocodone which does help.  He states it is not addicted.  Prior records of pain medicine use are reviewed.              Review of Systems All systems reviewed are negative as they relate to the chief complaint within the history of present illness.  Patient denies  fevers or chills.    Assessment & Plan: Visit Diagnoses: No diagnosis found.  Plan: Impression is right knee pain with arthritis.  Injection performed today.  I'll refill his hydrocodone but he will need to seek further refills from other providers in the near future.  We do not do chronic long-term pain medicine.  Follow-up as needed  Follow-Up Instructions: Return if symptoms worsen or fail to improve.   Orders:  No orders of the defined types were placed in this encounter.  No orders of the defined types were placed in this encounter.     Procedures: Large Joint Inj Date/Time: 01/08/2017 1:21 AM Performed by: Meredith Pel Authorized by: Meredith Pel   Consent Given by:  Patient Site marked: the procedure site was marked   Timeout: prior to procedure the correct patient, procedure, and site was verified   Indications:  Pain, joint swelling and diagnostic evaluation Location:  Knee Site:  R knee Prep: patient was prepped and draped in usual sterile fashion   Needle Size:  18 G Needle Length:  1.5 inches Approach:  Superolateral Ultrasound Guidance: No     Fluoroscopic Guidance: No   Arthrogram: No   Medications:  5 mL lidocaine 1 %; 4 mL bupivacaine 0.25 %; 40 mg methylPREDNISolone acetate 40 MG/ML Patient tolerance:  Patient tolerated the procedure well with no immediate complications     Clinical Data: No additional findings.  Objective: Vital Signs: There were no vitals taken for this visit.  Physical Exam   Constitutional: Patient appears well-developed HEENT:  Head: Normocephalic Eyes:EOM are normal Neck: Normal range of motion Cardiovascular: Normal rate Pulmonary/chest: Effort normal Neurologic: Patient is alert Skin: Skin is warm Psychiatric: Patient has normal mood and affect   Ortho Exam right knee exam demonstrates trace effusion and good range of motion medial lateral joint line tenderness stable, crucial ligaments palpable pedal pulses no groin pain with internal/external rotation leg no other masses lymph adenopathy or skin changes noted in the right leg region or knee region.   Specialty Comments:  No specialty comments available.  Imaging: No results found.   PMFS History: Patient Active Problem List   Diagnosis Date Noted  . Tubular adenoma of colon 12/29/2016  . Basal cell carcinoma 08/19/2016  . Umbilical hernia s/p lap repair with mesh 06/27/2016 06/27/2016  . Degenerative arthritis of knee 03/22/2015  . Dyspnea on exertion 01/31/2015  . Lumbar radiculopathy 12/12/2014  . Facet hypertrophy of lumbar region 12/12/2014  . Left knee pain 12/12/2014  .  Ulcer of lower limb ([place]) 01/10/2014  . Varicose veins of lower extremities with other complications 123XX123  . 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 Renal insufficiency 12/21/2013  . Swelling in head/neck 07/28/2012  . Venous (peripheral) insufficiency 05/25/2012  . 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 Pain in limb 02/12/2012  . Hyperlipidemia 02/28/2008  . Allergic rhinitis 02/28/2008  . Sleep apnea  02/28/2008   Past Medical History:  Diagnosis Date  . Allergy    takes Zyrtec daily  . Anxiety    takes Ativan daily as needed  . Arthritis   . Back pain   . BPH (benign prostatic hyperplasia)    takes doxazosin for  . 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 Depression    takes Wellbutrin daily  . Dyspnea on exertion    with exertion  . GERD (gastroesophageal reflux disease)    takes Nexium and Omeprazole daily  . History of kidney stones   . Hx of [facility] spotted fever childhood  . Hyperlipidemia   . Neuromuscular disorder ([place])   . Sleep apnea   . Swelling of lower extremity    right more than leg leg  . Umbilical hernia     Family History  Problem Relation Age of Onset  . Thyroid disease Mother   . Heart disease Father   . Diabetes Maternal Grandfather     Past Surgical History:  Procedure Laterality Date  . 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 BACK SURGERY  2003   L4, L5  . BACK SURGERY    . ENDOVENOUS ABLATION SAPHENOUS VEIN W/ LASER Right 06-01-2014   EVLA right greater saphenous vein by Curt Jews MD   . epidural steroid injections        [facility] dr newton  . hernia repair  2003  . HERNIA REPAIR     hernia inguinal x 2  . TOTAL KNEE ARTHROPLASTY Left 03/22/2015   Procedure: TOTAL KNEE ARTHROPLASTY;  Surgeon: Meredith Pel, MD;  Location: [place];  Service: Orthopedics;  Laterality: Left;  . UMBILICAL HERNIA REPAIR N/A 06/27/2016   Procedure: LAPAROSCOPIC ASSISTED REPAIR OF UMBILICAL HERINA WITH MESH;  Surgeon: Johnathan Hausen, MD;  Location: [facility];  Service: General;  Laterality: N/A;  . VASCULAR SURGERY  2015   right leg   Social History   Occupational History  . [employer]/Real Estate Retired   Social History Main Topics  . Smoking status: Never Smoker  . Smokeless tobacco: Never Used  . Alcohol use No  . Drug use: No  . Sexual activity: Yes    Birth control/ protection: Condom

## 2017-01-06 ENCOUNTER — Telehealth: Payer: Self-pay | Admitting: Cardiovascular Disease

## 2017-01-06 ENCOUNTER — Encounter (INDEPENDENT_AMBULATORY_CARE_PROVIDER_SITE_OTHER): Payer: Self-pay | Admitting: Orthopedic Surgery

## 2017-01-06 ENCOUNTER — Ambulatory Visit (INDEPENDENT_AMBULATORY_CARE_PROVIDER_SITE_OTHER): Payer: Medicare Other | Admitting: Orthopedic Surgery

## 2017-01-06 DIAGNOSIS — M6701 Short Achilles tendon (acquired), right ankle: Secondary | ICD-10-CM

## 2017-01-06 DIAGNOSIS — I872 Venous insufficiency (chronic) (peripheral): Secondary | ICD-10-CM | POA: Diagnosis not present

## 2017-01-06 DIAGNOSIS — M205X2 Other deformities of toe(s) (acquired), left foot: Secondary | ICD-10-CM | POA: Insufficient documentation

## 2017-01-06 DIAGNOSIS — M6702 Short Achilles tendon (acquired), left ankle: Secondary | ICD-10-CM | POA: Diagnosis not present

## 2017-01-06 DIAGNOSIS — M898X7 Other specified disorders of bone, ankle and foot: Secondary | ICD-10-CM | POA: Insufficient documentation

## 2017-01-06 NOTE — Telephone Encounter (Signed)
New message  ° ° °Patient calling for test results.   °

## 2017-01-06 NOTE — Telephone Encounter (Signed)
Follow up    Pt is returning Nathan's call.

## 2017-01-06 NOTE — Progress Notes (Signed)
Office Visit Note   Patient: [name]           Date of Birth: 1941/01/12           MRN: [id number] Visit Date: 01/06/2017              Requested by: No referring provider defined for this encounter. PCP: No PCP Per Patient  Chief Complaint  Patient presents with  . Left Foot - Pain    HPI: Patient is a 76 y.o male who presents today for reevaluation of left foot. He had previous heel cord contracture with second metatarsalgia. He was pending gastrocnemius recession and PIP resection for fixed clawing of 2nd toe and Weil osteotomy of the 2nd metatarsal. Patient ended up cancelling his pain subsiding. He is taking meloxicam for his back pain and meloxicam on occasion. He complains of severe pain at times when he can hardly walk. Maxcine Ham, RT    Assessment & Plan: Visit Diagnoses:  1. Venous (peripheral) insufficiency   2. Claw toe, acquired, left   3. Achilles tendon contracture, bilateral   4. Pain in metatarsus of both feet     Plan: Recommended heel cord stretching for the Achilles contracture. Patient states he has difficulty considering surgery at this time. I have recommended and encouraged conservative treatment first to see if this will relieve his symptoms. Recommended a new balance walking sneaker that would unload pressure from the forefoot is currently wearing croc slip on sandals. Recommended arch supports to further unload the metatarsal heads.  Discussed if he fails conservative treatment options would be a gastrocnemius recession a Weil osteotomy for the second metatarsal plantar plate repair and resection of the PIP joint of the second toe left foot.  Follow-Up Instructions: Return if symptoms worsen or fail to improve.   Ortho Exam On examination patient is alert oriented no adenopathy well-dressed normal affect normal respiratory effort he does have an antalgic gait he is a good dorsalis pedis pulse bilaterally. He has dorsiflexion to neutral  with heel cord tightness. Pain is reproduced with palpation beneath the second metatarsal head left foot. He has fixed clawing of the second toe with callus over the PIP joint left foot second toe. There is dislocation of the MTP joint second toe left foot. There is no redness no cellulitis no ulceration.  Imaging: No results found.  Orders:  No orders of the defined types were placed in this encounter.  No orders of the defined types were placed in this encounter.    Procedures: No procedures performed  Clinical Data: No additional findings.  Subjective: Review of Systems  Objective: Vital Signs: There were no vitals taken for this visit.  Specialty Comments:  No specialty comments available.  PMFS History: Patient Active Problem List   Diagnosis Date Noted  . Claw toe, acquired, left 01/06/2017  . Achilles tendon contracture, bilateral 01/06/2017  . Pain in metatarsus of both feet 01/06/2017  . Tubular adenoma of colon 12/29/2016  . Basal cell carcinoma 08/19/2016  . Umbilical hernia s/p lap repair with mesh 06/27/2016 06/27/2016  . Degenerative arthritis of knee 03/22/2015  . Dyspnea on exertion 01/31/2015  . Lumbar radiculopathy 12/12/2014  . Facet hypertrophy of lumbar region 12/12/2014  . Left knee pain 12/12/2014  . Ulcer of lower limb ([place]) 01/10/2014  . Varicose veins of lower extremities with other complications 123XX123  . 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 Renal insufficiency 12/21/2013  .  Swelling in head/neck 07/28/2012  . Venous (peripheral) insufficiency 05/25/2012  . 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 Pain in limb 02/12/2012  . Hyperlipidemia 02/28/2008  . Allergic rhinitis 02/28/2008  . Sleep apnea 02/28/2008   Past Medical History:  Diagnosis Date  . Allergy    takes Zyrtec daily  . Anxiety    takes Ativan daily as needed  . Arthritis   . Back pain   . BPH (benign prostatic hyperplasia)    takes  doxazosin for  . 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 Depression    takes Wellbutrin daily  . Dyspnea on exertion    with exertion  . GERD (gastroesophageal reflux disease)    takes Nexium and Omeprazole daily  . History of kidney stones   . Hx of [facility] spotted fever childhood  . Hyperlipidemia   . Neuromuscular disorder ([place])   . Sleep apnea   . Swelling of lower extremity    right more than leg leg  . Umbilical hernia     Family History  Problem Relation Age of Onset  . Thyroid disease Mother   . Heart disease Father   . Diabetes Maternal Grandfather     Past Surgical History:  Procedure Laterality Date  . 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 BACK SURGERY  2003   L4, L5  . BACK SURGERY    . ENDOVENOUS ABLATION SAPHENOUS VEIN W/ LASER Right 06-01-2014   EVLA right greater saphenous vein by Curt Jews MD   . epidural steroid injections        [facility] dr [facility]  . hernia repair  2003  . HERNIA REPAIR     hernia inguinal x 2  . TOTAL KNEE ARTHROPLASTY Left 03/22/2015   Procedure: TOTAL KNEE ARTHROPLASTY;  Surgeon: Meredith Pel, MD;  Location: [place];  Service: Orthopedics;  Laterality: Left;  . UMBILICAL HERNIA REPAIR N/A 06/27/2016   Procedure: LAPAROSCOPIC ASSISTED REPAIR OF UMBILICAL HERINA WITH MESH;  Surgeon: Johnathan Hausen, MD;  Location: [facility];  Service: General;  Laterality: N/A;  . VASCULAR SURGERY  2015   right leg   Social History   Occupational History  . [employer]/Real Estate Retired   Social History Main Topics  . Smoking status: Never Smoker  . Smokeless tobacco: Never Used  . Alcohol use No  . Drug use: No  . Sexual activity: Yes    Birth control/ protection: Condom

## 2017-01-06 NOTE — Telephone Encounter (Signed)
Left msg for pt to call. 

## 2017-01-06 NOTE — Telephone Encounter (Signed)
Spoke to patient, he voiced frustration as he'd had tests done 2 weeks ago, and has yet to hear anything about the results. He wasn't clear on which tests he had done. However, does state he went to [facility] for one of these (so, possibly, he had the echocardiogram done there?) He knows he hasn't had stress test yet. This still needs to be scheduled.  I am not seeing results of the echo and am needing to find out what happened to this. I also don't see images for his ECG -- he requests results of these also.  If echo was done but report not scanned in, who can we contact to get assistance with this so that provider can read?

## 2017-01-06 NOTE — Telephone Encounter (Signed)
Called patient. I do not see that he has any recent test results. Was seen in office and testing ordered - he may be calling to schedule this. I've sent a msg to schedulers to see if this can be done.

## 2017-01-08 ENCOUNTER — Encounter (INDEPENDENT_AMBULATORY_CARE_PROVIDER_SITE_OTHER): Payer: Self-pay | Admitting: Orthopedic Surgery

## 2017-01-08 DIAGNOSIS — M1711 Unilateral primary osteoarthritis, right knee: Secondary | ICD-10-CM

## 2017-01-08 MED ORDER — BUPIVACAINE HCL 0.25 % IJ SOLN
4.0000 mL | INTRAMUSCULAR | Status: AC | PRN
  Administered 2017-01-08: 4 mL via INTRA_ARTICULAR

## 2017-01-08 MED ORDER — LIDOCAINE HCL 1 % IJ SOLN
5.0000 mL | INTRAMUSCULAR | Status: AC | PRN
  Administered 2017-01-08: 5 mL

## 2017-01-08 MED ORDER — METHYLPREDNISOLONE ACETATE 40 MG/ML IJ SUSP
40.0000 mg | INTRAMUSCULAR | Status: AC | PRN
  Administered 2017-01-08: 40 mg via INTRA_ARTICULAR

## 2017-01-12 NOTE — Telephone Encounter (Signed)
Pt has not had echo or stress test done yet, does not even appear to have been scheduled. Results of EKG are in Dr. Rachel Bo note from ov on 2/13, but unsure what happened to the EKG. I will find out.

## 2017-01-13 ENCOUNTER — Ambulatory Visit (INDEPENDENT_AMBULATORY_CARE_PROVIDER_SITE_OTHER): Payer: Medicare Other | Admitting: Physical Medicine and Rehabilitation

## 2017-01-13 ENCOUNTER — Encounter (INDEPENDENT_AMBULATORY_CARE_PROVIDER_SITE_OTHER): Payer: Self-pay | Admitting: Physical Medicine and Rehabilitation

## 2017-01-13 ENCOUNTER — Ambulatory Visit (INDEPENDENT_AMBULATORY_CARE_PROVIDER_SITE_OTHER): Payer: Self-pay

## 2017-01-13 VITALS — BP 145/81 | HR 67 | Temp 98.4°F

## 2017-01-13 DIAGNOSIS — M4316 Spondylolisthesis, lumbar region: Secondary | ICD-10-CM

## 2017-01-13 DIAGNOSIS — M5416 Radiculopathy, lumbar region: Secondary | ICD-10-CM

## 2017-01-13 DIAGNOSIS — M48062 Spinal stenosis, lumbar region with neurogenic claudication: Secondary | ICD-10-CM

## 2017-01-13 DIAGNOSIS — M4802 Spinal stenosis, cervical region: Secondary | ICD-10-CM

## 2017-01-13 MED ORDER — METHYLPREDNISOLONE ACETATE 80 MG/ML IJ SUSP
80.0000 mg | Freq: Once | INTRAMUSCULAR | Status: AC
  Administered 2017-01-15: 80 mg

## 2017-01-13 MED ORDER — LIDOCAINE HCL (PF) 1 % IJ SOLN: 0.3300 mL | Freq: Once | INTRAMUSCULAR | Status: DC

## 2017-01-13 NOTE — Progress Notes (Signed)
[name] - 76 y.o. male MRN [id number]  Date of birth: 10-Feb-1941  Office Visit Note: Visit Date: 01/13/2017 PCP: No PCP Per Patient Referred by: No ref. provider found  Subjective: Chief Complaint  Patient presents with  . Lower Back - Pain   HPI: Mr. [name] is a pleasant but somewhat anxious 76 year old gentleman who we have seen over the past year or so. He has a known stenosis at L4-5 with listhesis and quite significant stenosis. He goes through periods where he'll get primarily left-sided radicular pain with standing and ambulating and this has increased again over the last few weeks. He reports there were times he probably should've come in but he just didn't. It seems like it always flares up around the time of the [employer].  He is having a lot of left side pain but today pain is all the way across lower back. Worse with increased activity and walking long periods. Throbbing pain at times. Interestingly, he also comments about his cervical spine. We did obtain an MRI of his cervical spine last year and he felt a follow-up. He states that no one ever contacted him but we do have records that he was contacted on multiple occasions about that. However we should follow up with him in a couple weeks to see how he does with the injection today which was a planned L4 transforaminal injection and will evaluate fully his cervical spine at that point. I did show his MRI to him today because he was anxious to see what it looks like. Also of note he does not have a primary care physician and really needs to have a primary care physician and we may be able to see if we can try to find one for him.    ROS Otherwise per HPI.  Assessment & Plan: Visit Diagnoses:  1. Lumbar radiculopathy   2. Spinal stenosis of lumbar region with neurogenic claudication   3. Spondylolisthesis of lumbar region   4. Spinal stenosis of cervical region     Plan: Findings:  Bilateral L4 transforaminal  epidural steroid injection.    Meds & Orders:  Meds ordered this encounter  Medications  . DISCONTD: lidocaine (PF) (XYLOCAINE) 1 % injection 0.3 mL  . methylPREDNISolone acetate (DEPO-MEDROL) injection 80 mg    Orders Placed This Encounter  Procedures  . XR C-ARM NO REPORT  . Epidural Steroid injection    Follow-up: Return in about 3 weeks (around 02/03/2017) for review Cspine.   Procedures: No procedures performed  Lumbosacral Transforaminal Epidural Steroid Injection - Infraneural Approach with Fluoroscopic Guidance  Patient: [name]      Date of Birth: 09/23/1941 MRN: [id number] PCP: No PCP Per Patient      Visit Date: 01/13/2017   Universal Protocol:    Date/Time: 03/06/183:16 PM  Consent Given By: the patient  Position: PRONE   Additional Comments: Vital signs were monitored before and after the procedure. Patient was prepped and draped in the usual sterile fashion. The correct patient, procedure, and site was verified.   Injection Procedure Details:  Procedure Site One Meds Administered:  Meds ordered this encounter  Medications  . lidocaine (PF) (XYLOCAINE) 1 % injection 0.3 mL  . methylPREDNISolone acetate (DEPO-MEDROL) injection 80 mg      Laterality: Bilateral  Location/Site:  L4-L5  Needle size: 22 G  Needle type: Spinal  Needle Placement: Transforaminal  Findings:  -Contrast Used: 1 mL iohexol 180 mg iodine/mL   -Comments: Excellent  flow of contrast along the nerve and into the epidural space.  Procedure Details: After squaring off the end-plates of the desired vertebral level to get a true AP view, the C-arm was obliqued to the painful side so that the superior articulating process is positioned about 1/3 the length of the inferior endplate.  The needle was aimed toward the junction of the superior articular process and the transverse process of the inferior vertebrae. The needle's initial entry is in the lower third of the foramen  through Kambin's triangle. The soft tissues overlying this target were infiltrated with 2-3 ml. of 1% Lidocaine without Epinephrine.  The spinal needle was then inserted and advanced toward the target using a "trajectory" view along the fluoroscope beam.  Under AP and lateral visualization, the needle was advanced so it did not puncture dura and did not traverse medially beyond the 6 o'clock position of the pedicle. Bi-planar projections were used to confirm position. Aspiration was confirmed to be negative for CSF and/or blood. A 1-2 ml. volume of Isovue-250 was injected and flow of contrast was noted at each level. Radiographs were obtained for documentation purposes.   After attaining the desired flow of contrast documented above, a 0.5 to 1.0 ml test dose of 0.25% Marcaine was injected into each respective transforaminal space.  The patient was observed for 90 seconds post injection.  After no sensory deficits were reported, and normal lower extremity motor function was noted,   the above injectate was administered so that equal amounts of the injectate were placed at each foramen (level) into the transforaminal epidural space.   Additional Comments:  The patient tolerated the procedure well Dressing: Band-Aid    Post-procedure details: Patient was observed during the procedure. Post-procedure instructions were reviewed.  Patient left the clinic in stable condition.   Clinical History: IMPRESSION: Mild progressive degenerative changes when compared to prior exam. Summary of pertinent findings includes:  C1-2 degenerative changes with small amount of fluid right C1-2 lateral mass articulation.  C2-3 no significant spinal stenosis or foraminal narrowing.  C3-4 multifactorial mild to moderate foraminal narrowing greater on the left. Vertebral arteries extends into neural foramen. Spur posterior superior aspect of the C4 vertebra greatest left paracentral position. Mild indentation  ventral thecal sac and minimal left-sided cord contact.  C4-5 broad-based disc osteophyte complex greater to the right. Spinal stenosis and cord flattening greater on the right. Multifactorial marked bilateral foraminal narrowing greater on the right.  C5-6 broad-based disc osteophyte complex greater to the right. Mild right-sided cord flattening. Multifactorial mild to moderate right foraminal narrowing.  C6-7 broad-based disc osteophyte complex slightly greater to left. Ventral thecal sac narrowing with minimal left-sided cord contact. Multifactorial marked bilateral foraminal narrowing.  C7-T1 bulge and spur with narrowing ventral thecal sac. Moderate bilateral foraminal narrowing.   Electronically Signed By: Genia Del M.D. On: 08/30/2016 13:03  He reports that he has never smoked. He has never used smokeless tobacco.   Recent Labs  03/11/16 1357  HGBA1C 5.8    Objective:  VS:  HT:    WT:   BMI:     BP:(!) 145/81  HR:67bpm  TEMP:98.4 F (36.9 C)( )  RESP:95 % Physical Exam  Musculoskeletal:  Patient ambulates without aid with a forward flexed spine. He is stiff with lumbar extension with concordant pain he has good distal strength without any deficits and no clonus.    Ortho Exam Imaging: No results found.  Past Medical/Family/Surgical/Social History: Medications & Allergies reviewed per EMR  Patient Active Problem List   Diagnosis Date Noted  . Spinal stenosis of lumbar region with neurogenic claudication 01/15/2017  . Spondylolisthesis of lumbar region 01/15/2017  . Claw toe, acquired, left 01/06/2017  . Achilles tendon contracture, bilateral 01/06/2017  . Pain in metatarsus of both feet 01/06/2017  . Tubular adenoma of colon 12/29/2016  . Basal cell carcinoma 08/19/2016  . Umbilical hernia s/p lap repair with mesh 06/27/2016 06/27/2016  . Degenerative arthritis of knee 03/22/2015  . Dyspnea on exertion 01/31/2015  . Lumbar radiculopathy 12/12/2014    . Facet hypertrophy of lumbar region 12/12/2014  . Left knee pain 12/12/2014  . Ulcer of lower limb ([place]) 01/10/2014  . Varicose veins of lower extremities with other complications 123XX123  . 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 Renal insufficiency 12/21/2013  . Swelling in head/neck 07/28/2012  . Venous (peripheral) insufficiency 05/25/2012  . 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 Pain in limb 02/12/2012  . Hyperlipidemia 02/28/2008  . Allergic rhinitis 02/28/2008  . Sleep apnea 02/28/2008   Past Medical History:  Diagnosis Date  . Allergy    takes Zyrtec daily  . Anxiety    takes Ativan daily as needed  . Arthritis   . Back pain   . BPH (benign prostatic hyperplasia)    takes doxazosin for  . 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 Depression    takes Wellbutrin daily  . Dyspnea on exertion    with exertion  . GERD (gastroesophageal reflux disease)    takes Nexium and Omeprazole daily  . History of kidney stones   . Hx of [facility] spotted fever childhood  . Hyperlipidemia   . Neuromuscular disorder ([place])   . Sleep apnea   . Swelling of lower extremity    right more than leg leg  . Umbilical hernia    Family History  Problem Relation Age of Onset  . Thyroid disease Mother   . Heart disease Father   . Diabetes Maternal Grandfather    Past Surgical History:  Procedure Laterality Date  . 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 BACK SURGERY  2003   L4, L5  . BACK SURGERY    . ENDOVENOUS ABLATION SAPHENOUS VEIN W/ LASER Right 06-01-2014   EVLA right greater saphenous vein by Curt Jews MD   . epidural steroid injections        [facility] dr Jaylissa Felty  . hernia repair  2003  . HERNIA REPAIR     hernia inguinal x 2  . TOTAL KNEE ARTHROPLASTY Left 03/22/2015   Procedure: TOTAL KNEE ARTHROPLASTY;  Surgeon: Meredith Pel, MD;  Location: [place];  Service: Orthopedics;  Laterality:  Left;  . UMBILICAL HERNIA REPAIR N/A 06/27/2016   Procedure: LAPAROSCOPIC ASSISTED REPAIR OF UMBILICAL HERINA WITH MESH;  Surgeon: Johnathan Hausen, MD;  [facility];  Service: General;  Laterality: N/A;  . VASCULAR SURGERY  2015   right leg   Social History   Occupational History  . [employer]/Real Estate Retired   Social History Main Topics  . Smoking status: Never Smoker  . Smokeless tobacco: Never Used  . Alcohol use No  . Drug use: No  . Sexual activity: Yes    Birth control/ protection: Condom

## 2017-01-13 NOTE — Procedures (Signed)
Lumbosacral Transforaminal Epidural Steroid Injection - Infraneural Approach with Fluoroscopic Guidance  Patient: [name]      Date of Birth: 12-26-1940 MRN: [id number] PCP: No PCP Per Patient      Visit Date: 01/13/2017   Universal Protocol:    Date/Time: 03/06/183:16 PM  Consent Given By: the patient  Position: PRONE   Additional Comments: Vital signs were monitored before and after the procedure. Patient was prepped and draped in the usual sterile fashion. The correct patient, procedure, and site was verified.   Injection Procedure Details:  Procedure Site One Meds Administered:  Meds ordered this encounter  Medications  . lidocaine (PF) (XYLOCAINE) 1 % injection 0.3 mL  . methylPREDNISolone acetate (DEPO-MEDROL) injection 80 mg      Laterality: Bilateral  Location/Site:  L4-L5  Needle size: 22 G  Needle type: Spinal  Needle Placement: Transforaminal  Findings:  -Contrast Used: 1 mL iohexol 180 mg iodine/mL   -Comments: Excellent flow of contrast along the nerve and into the epidural space.  Procedure Details: After squaring off the end-plates of the desired vertebral level to get a true AP view, the C-arm was obliqued to the painful side so that the superior articulating process is positioned about 1/3 the length of the inferior endplate.  The needle was aimed toward the junction of the superior articular process and the transverse process of the inferior vertebrae. The needle's initial entry is in the lower third of the foramen through Kambin's triangle. The soft tissues overlying this target were infiltrated with 2-3 ml. of 1% Lidocaine without Epinephrine.  The spinal needle was then inserted and advanced toward the target using a "trajectory" view along the fluoroscope beam.  Under AP and lateral visualization, the needle was advanced so it did not puncture dura and did not traverse medially beyond the 6 o'clock position of the pedicle. Bi-planar  projections were used to confirm position. Aspiration was confirmed to be negative for CSF and/or blood. A 1-2 ml. volume of Isovue-250 was injected and flow of contrast was noted at each level. Radiographs were obtained for documentation purposes.   After attaining the desired flow of contrast documented above, a 0.5 to 1.0 ml test dose of 0.25% Marcaine was injected into each respective transforaminal space.  The patient was observed for 90 seconds post injection.  After no sensory deficits were reported, and normal lower extremity motor function was noted,   the above injectate was administered so that equal amounts of the injectate were placed at each foramen (level) into the transforaminal epidural space.   Additional Comments:  The patient tolerated the procedure well Dressing: Band-Aid    Post-procedure details: Patient was observed during the procedure. Post-procedure instructions were reviewed.  Patient left the clinic in stable condition.

## 2017-01-13 NOTE — Patient Instructions (Signed)

## 2017-01-15 ENCOUNTER — Other Ambulatory Visit: Payer: Self-pay | Admitting: Urgent Care

## 2017-01-15 ENCOUNTER — Other Ambulatory Visit: Payer: Self-pay | Admitting: Emergency Medicine

## 2017-01-15 DIAGNOSIS — M4316 Spondylolisthesis, lumbar region: Secondary | ICD-10-CM | POA: Insufficient documentation

## 2017-01-15 DIAGNOSIS — M5440 Lumbago with sciatica, unspecified side: Secondary | ICD-10-CM

## 2017-01-15 DIAGNOSIS — M48062 Spinal stenosis, lumbar region with neurogenic claudication: Secondary | ICD-10-CM | POA: Insufficient documentation

## 2017-01-15 DIAGNOSIS — M5416 Radiculopathy, lumbar region: Secondary | ICD-10-CM | POA: Diagnosis not present

## 2017-01-15 NOTE — Telephone Encounter (Signed)
10/2016 last ov

## 2017-01-16 NOTE — Telephone Encounter (Signed)
Patient needs an office visit. Please let him know that we need to see him to prescribed him medications.

## 2017-01-19 DIAGNOSIS — R197 Diarrhea, unspecified: Secondary | ICD-10-CM | POA: Diagnosis not present

## 2017-01-19 DIAGNOSIS — R140 Abdominal distension (gaseous): Secondary | ICD-10-CM | POA: Diagnosis not present

## 2017-01-19 DIAGNOSIS — M545 Low back pain: Secondary | ICD-10-CM | POA: Diagnosis not present

## 2017-01-19 DIAGNOSIS — R1032 Left lower quadrant pain: Secondary | ICD-10-CM | POA: Diagnosis not present

## 2017-01-22 ENCOUNTER — Ambulatory Visit (INDEPENDENT_AMBULATORY_CARE_PROVIDER_SITE_OTHER): Payer: Medicare Other | Admitting: Physical Medicine and Rehabilitation

## 2017-01-22 ENCOUNTER — Encounter (INDEPENDENT_AMBULATORY_CARE_PROVIDER_SITE_OTHER): Payer: Self-pay

## 2017-01-22 ENCOUNTER — Telehealth (INDEPENDENT_AMBULATORY_CARE_PROVIDER_SITE_OTHER): Payer: Self-pay | Admitting: Physical Medicine and Rehabilitation

## 2017-01-22 ENCOUNTER — Encounter (INDEPENDENT_AMBULATORY_CARE_PROVIDER_SITE_OTHER): Payer: Self-pay | Admitting: Physical Medicine and Rehabilitation

## 2017-01-22 VITALS — BP 126/67 | HR 62

## 2017-01-22 DIAGNOSIS — M4316 Spondylolisthesis, lumbar region: Secondary | ICD-10-CM | POA: Diagnosis not present

## 2017-01-22 DIAGNOSIS — M48062 Spinal stenosis, lumbar region with neurogenic claudication: Secondary | ICD-10-CM | POA: Diagnosis not present

## 2017-01-22 DIAGNOSIS — M47816 Spondylosis without myelopathy or radiculopathy, lumbar region: Secondary | ICD-10-CM

## 2017-01-22 DIAGNOSIS — M47896 Other spondylosis, lumbar region: Secondary | ICD-10-CM

## 2017-01-22 NOTE — Progress Notes (Signed)
[name] - 76 y.o. male MRN [id number]  Date of birth: 1941/05/01  Office Visit Note: Visit Date: 01/22/2017 PCP: No PCP Per Patient Referred by: No ref. provider found  Subjective: Chief Complaint  Patient presents with  . Neck - Pain   HPI: Mr. [name] is a 76 year old gentleman that I am seeing now over the past year initially for lumbar spine issues including listhesis with pretty significant stenosis and prior lumbar surgery. Last injection we performed was a bilateral L4 transforaminal injection and he states he didn't get as much relief as he normally does. He spends probably 15 minutes today speaking of symptoms of his low back and hip and leg issues. Mainly they're worse with standing and ambulating and twisting and turning and movement. There are tending to be worse during the day but also worse when he first gets up. He states that his back pain is really the more severe problem but he does endorse neck pain which I'll review below. He is very curious and spends an inordinate amount of time trying to figure out why he has the pain and why the injection was not as beneficial. The simple fact of the matter is a did review images with him today and he is a somewhat difficult injection in terms of the stenosis and narrowing and listhesis. And I do feel like particularly on the left side that there was some probably a better injection that was more epidural oriented. Although the flow of contrast was decent. We reviewed all his images with him again today as well. In terms of his leg pain he denies any focal weakness or foot drop. He has not been to physical therapy recently. He is also trying to find a primary care physician to take over. He has been followed by an urgent care family practitioner.  In terms of his neck pain he states it is really not the most severe issues that he is having but again he spends probably 15 more minutes discussing every symptom that he can think up because  he feels like if he can tell me all of the symptoms that we will figure out what's wrong. We do have an MRI of his cervical spine and this was done in October. He unfortunately failed to follow-up at that point stating that no one ever called them but regardless the staff does show multiple messages that were sent to him. He does have significant degenerative changes of the cervical spine. He has reversal of the normal lordosis particularly at the C5-6 area. The C5-6 area seems to be the worst level and would correspond somewhat with his more right-sided neck complaints. His neck in general gets some stiffness and cracking and popping with turning. He gets some pain referred into the shoulder nothing down the arms or hands. No numbness tingling in the hands. But he does have multilevel disease quite significant. Both his neck and low back are chronic long-term problems.  His case is complicated by anxiety and depression. He does take Wellbutrin. He has had a history of using some Ativan. He was given hydrocodone by Dr. Marlou Sa in February. He does take gabapentin.     Lower back- this time injection has not done what it normally does. Significant difficulty with back since injection.  Neck- hasn't been bothering him recently. When it does hurt it tends to radiate to right arm. Neck feels better if he keeps head bent slightly forward. Review of Systems  Constitutional: Negative  for chills, fever, malaise/fatigue and weight loss.  HENT: Negative for hearing loss and sinus pain.   Eyes: Negative for blurred vision, double vision and photophobia.  Respiratory: Negative for cough and shortness of breath.   Cardiovascular: Negative for chest pain, palpitations and leg swelling.  Gastrointestinal: Negative for abdominal pain, nausea and vomiting.  Genitourinary: Negative for flank pain.  Musculoskeletal: Positive for back pain, joint pain and neck pain. Negative for myalgias.  Skin: Negative for itching and  rash.  Neurological: Negative for tremors, focal weakness and weakness.  Endo/Heme/Allergies: Negative.   Psychiatric/Behavioral: Negative for depression.  All other systems reviewed and are negative.  Otherwise per HPI.  Assessment & Plan: Visit Diagnoses:  1. Spondylolisthesis of lumbar region   2. Facet hypertrophy of lumbar region   3. Spinal stenosis of lumbar region with neurogenic claudication     Plan: Findings:  Chronic worsening severe lumbar spine pain with referral into the left more than right hip and leg. This is likely secondary to grade 1 listhesis with pretty significant stenosis. He's had prior lumbar surgery. His pain complaints are also common. By history of depression and anxiety. He is taking gabapentin he has had some hydrocodone by Dr. Marlou Sa. Last injection did seem to help very much. He's had gotten relief in the past from transforaminal injection at the level of stenosis. I think the right thing to do is to repeat this 1 and see how he does with a very well-placed injection and see if that does make a difference. He obviously is someone that could be a surgical referral for the stenosis and listhesis. We also need to try to help him get a primary care physician as well. In terms of his neck pain this is chronic and ongoing. His cervical spine is very degenerative with stenosis as well. I told her these probably at increased risk for central cord syndrome with any kind of trauma. His symptoms are not quite as severe as the lumbar spine. Obviously we could look at goal epidural injection and we discussed the risk to benefit ratio of this. He wants to hold off on this at this point. I have encouraged him as well to probably look at physical therapy for at least a short course and is to think about that as well. I spent more than 45 minutes speaking face-to-face with the patient with 50% of the time in counseling.    Meds & Orders: No orders of the defined types were placed in  this encounter.  No orders of the defined types were placed in this encounter.   Follow-up: Return for Bilateral L4 transforaminal epidural steroid injection..   Procedures: No procedures performed  No notes on file   Clinical History: IMPRESSION: Mild progressive degenerative changes when compared to prior exam. Summary of pertinent findings includes:  C1-2 degenerative changes with small amount of fluid right C1-2 lateral mass articulation.  C2-3 no significant spinal stenosis or foraminal narrowing.  C3-4 multifactorial mild to moderate foraminal narrowing greater on the left. Vertebral arteries extends into neural foramen. Spur posterior superior aspect of the C4 vertebra greatest left paracentral position. Mild indentation ventral thecal sac and minimal left-sided cord contact.  C4-5 broad-based disc osteophyte complex greater to the right. Spinal stenosis and cord flattening greater on the right. Multifactorial marked bilateral foraminal narrowing greater on the right.  C5-6 broad-based disc osteophyte complex greater to the right. Mild right-sided cord flattening. Multifactorial mild to moderate right foraminal narrowing.  C6-7 broad-based  disc osteophyte complex slightly greater to left. Ventral thecal sac narrowing with minimal left-sided cord contact. Multifactorial marked bilateral foraminal narrowing.  C7-T1 bulge and spur with narrowing ventral thecal sac. Moderate bilateral foraminal narrowing.   Electronically Signed By: Genia Del M.D. On: 08/30/2016 13:03  He reports that he has never smoked. He has never used smokeless tobacco.   Recent Labs  03/11/16 1357  HGBA1C 5.8    Objective:  VS:  HT:    WT:   BMI:     BP:126/67  HR:62bpm  TEMP: ( )  RESP:  Physical Exam  Constitutional: He is oriented to person, place, and time. He appears well-developed and well-nourished. No distress.  HENT:  Head: Normocephalic and atraumatic.  Nose: Nose  normal.  Mouth/Throat: Oropharynx is clear and moist.  Eyes: Conjunctivae are normal. Pupils are equal, round, and reactive to light.  Neck: Neck supple. No tracheal deviation present. No thyromegaly present.  Cardiovascular: Regular rhythm and intact distal pulses.   Pulmonary/Chest: Effort normal and breath sounds normal.  Abdominal: Soft. He exhibits no distension.  Musculoskeletal: He exhibits no deformity.  General appearance: NAD, conversant  Psych: Appropriate affect, alert and oriented to person, place and time  Eyes: anicteric sclerae, moist conjunctivae; no lid-lag; PERRLA Lungs: normal respiratory effort and no intercostal retractions, no wheezing CVA: normal pulses Extremities: No peripheral edema  Skin: Normal temperature, turgor and texture; no rash, ulcers or subcutaneous nodules MSK:/Neuro:   On manual muscle testing there is 5/5 strength in the distal muscle groups of the lower extremities bilaterally without deficits. There is no clonus  bilaterally.  Shows no scoliosis but significant tightness throughout the lumbar spine with concordant low back pain upon extension rotation of the lumbar spine. No pain with hip rotation internal or external. No pain over the greater trochanters. Good distal strength. He does have some tenderness in the paraspinal quadratus region with focal trigger points. Cervical exam shows forward flexed cervical spine with limited rotation right and left at end ranges. More pain with extension than forward flexion. Equivocal Spurling's test to the right. He has no shoulder impingement signs although he does get some pain at the end ranges of internal rotation and external rotation. He has good upper extremity strength bilaterally with a negative Hoffmann's test bilaterally. Muscle stretch reflexes were not really obtainable in the upper extremity due to the patient's inability really to relax.  Lymphadenopathy:    He has no cervical adenopathy.    Neurological: He is alert and oriented to person, place, and time.  Skin: Skin is warm. No rash noted.  Psychiatric: He has a normal mood and affect. His behavior is normal.  Nursing note and vitals reviewed.   Ortho Exam Imaging: No results found.  Past Medical/Family/Surgical/Social History: Medications & Allergies reviewed per EMR Patient Active Problem List   Diagnosis Date Noted  . Spinal stenosis of lumbar region with neurogenic claudication 01/15/2017  . Spondylolisthesis of lumbar region 01/15/2017  . Claw toe, acquired, left 01/06/2017  . Achilles tendon contracture, bilateral 01/06/2017  . Pain in metatarsus of both feet 01/06/2017  . Tubular adenoma of colon 12/29/2016  . Basal cell carcinoma 08/19/2016  . Umbilical hernia s/p lap repair with mesh 06/27/2016 06/27/2016  . Degenerative arthritis of knee 03/22/2015  . Dyspnea on exertion 01/31/2015  . Lumbar radiculopathy 12/12/2014  . Facet hypertrophy of lumbar region 12/12/2014  . Left knee pain 12/12/2014  . Ulcer of lower limb ([place]) 01/10/2014  . Varicose veins  of lower extremities with other complications 27/78/2423  . 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 Renal insufficiency 12/21/2013  . Swelling in head/neck 07/28/2012  . Venous (peripheral) insufficiency 05/25/2012  . 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 Pain in limb 02/12/2012  . Hyperlipidemia 02/28/2008  . Allergic rhinitis 02/28/2008  . Sleep apnea 02/28/2008   Past Medical History:  Diagnosis Date  . Allergy    takes Zyrtec daily  . Anxiety    takes Ativan daily as needed  . Arthritis   . Back pain   . BPH (benign prostatic hyperplasia)    takes doxazosin for  . 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 Depression    takes Wellbutrin daily  . Dyspnea on exertion    with exertion  . GERD (gastroesophageal reflux disease)    takes Nexium and Omeprazole daily  . History of kidney stones   . Hx  of [facility] spotted fever childhood  . Hyperlipidemia   . Neuromuscular disorder ([place])   . Sleep apnea   . Swelling of lower extremity    right more than leg leg  . Umbilical hernia    Family History  Problem Relation Age of Onset  . Thyroid disease Mother   . Heart disease Father   . Diabetes Maternal Grandfather    Past Surgical History:  Procedure Laterality Date  . 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 BACK SURGERY  2003   L4, L5  . BACK SURGERY    . ENDOVENOUS ABLATION SAPHENOUS VEIN W/ LASER Right 06-01-2014   EVLA right greater saphenous vein by Curt Jews MD   . epidural steroid injections        [facility] dr Chaniqua Brisby  . hernia repair  2003  . HERNIA REPAIR     hernia inguinal x 2  . TOTAL KNEE ARTHROPLASTY Left 03/22/2015   Procedure: TOTAL KNEE ARTHROPLASTY;  Surgeon: Meredith Pel, MD;  Location: [facility];  Service: Orthopedics;  Laterality: Left;  . UMBILICAL HERNIA REPAIR N/A 06/27/2016   Procedure: LAPAROSCOPIC ASSISTED REPAIR OF UMBILICAL HERINA WITH MESH;  Surgeon: Johnathan Hausen, MD;  Location: [facility];  Service: General;  Laterality: N/A;  . VASCULAR SURGERY  2015   right leg   Social History   Occupational History  . [employer]/Real Estate Retired   Social History Main Topics  . Smoking status: Never Smoker  . Smokeless tobacco: Never Used  . Alcohol use No  . Drug use: No  . Sexual activity: Yes    Birth control/ protection: Condom

## 2017-01-23 NOTE — Telephone Encounter (Signed)
Called patient and gave him the information/ phone numbers.

## 2017-01-23 NOTE — Telephone Encounter (Signed)
Tell him to contact  [facility], [phone] , [place] [phone]

## 2017-01-24 DIAGNOSIS — R600 Localized edema: Secondary | ICD-10-CM | POA: Diagnosis not present

## 2017-01-26 ENCOUNTER — Encounter (INDEPENDENT_AMBULATORY_CARE_PROVIDER_SITE_OTHER): Payer: Self-pay | Admitting: Physical Medicine and Rehabilitation

## 2017-02-03 ENCOUNTER — Ambulatory Visit (INDEPENDENT_AMBULATORY_CARE_PROVIDER_SITE_OTHER): Payer: Medicare Other | Admitting: Physical Medicine and Rehabilitation

## 2017-02-03 ENCOUNTER — Encounter (INDEPENDENT_AMBULATORY_CARE_PROVIDER_SITE_OTHER): Payer: Self-pay | Admitting: Physical Medicine and Rehabilitation

## 2017-02-03 ENCOUNTER — Ambulatory Visit (INDEPENDENT_AMBULATORY_CARE_PROVIDER_SITE_OTHER): Payer: Self-pay

## 2017-02-03 ENCOUNTER — Encounter (HOSPITAL_COMMUNITY): Payer: Self-pay | Admitting: Cardiovascular Disease

## 2017-02-03 VITALS — BP 154/96 | HR 84 | Temp 98.4°F

## 2017-02-03 DIAGNOSIS — M48062 Spinal stenosis, lumbar region with neurogenic claudication: Secondary | ICD-10-CM

## 2017-02-03 DIAGNOSIS — M5416 Radiculopathy, lumbar region: Secondary | ICD-10-CM

## 2017-02-03 MED ORDER — LIDOCAINE HCL (PF) 1 % IJ SOLN
0.3300 mL | Freq: Once | INTRAMUSCULAR | Status: AC
  Administered 2017-02-03: 0.3 mL

## 2017-02-03 MED ORDER — METHYLPREDNISOLONE ACETATE 80 MG/ML IJ SUSP
80.0000 mg | Freq: Once | INTRAMUSCULAR | Status: AC
  Administered 2017-02-03: 80 mg

## 2017-02-03 NOTE — Patient Instructions (Signed)

## 2017-02-03 NOTE — Progress Notes (Signed)
NOA CONSTANTE - 76 y.o. male MRN [id number]  Date of birth: [birth year]  Office Visit Note: Visit Date: 02/03/2017 PCP: No PCP Per Patient Referred by: No ref. provider found  Subjective: Chief Complaint  Patient presents with  . Lower Back - Pain   HPI: Mr.  Hanson is a 76 year old gentleman who is here today for planned left L4 transforaminal injection. No change in symptoms. Please see our prior evaluation and management note for further details and justification.    ROS Otherwise per HPI.  Assessment & Plan: Visit Diagnoses:  1. Lumbar radiculopathy   2. Spinal stenosis of lumbar region with neurogenic claudication     Plan: Findings:  Left L4 transforaminal epidural steroid injection. Injection looked well placed with good flow of contrast but there is very tight foraminal and lateral recess stenosis here. Unfortunately this patient may be looking at more of a surgical resolution to this at some point.    Meds & Orders:  Meds ordered this encounter  Medications  . lidocaine (PF) (XYLOCAINE) 1 % injection 0.3 mL  . methylPREDNISolone acetate (DEPO-MEDROL) injection 80 mg    Orders Placed This Encounter  Procedures  . XR C-ARM NO REPORT  . Epidural Steroid injection    Follow-up: Return if symptoms worsen or fail to improve, 2 weeks.   Procedures: No procedures performed  Lumbosacral Transforaminal Epidural Steroid Injection - Infraneural Approach with Fluoroscopic Guidance  Patient: [name]      Date of Birth: [birth year] MRN: [id number] PCP: No PCP Per Patient      Visit Date: 02/03/2017   Universal Protocol:    Date/Time: 03/29/185:53 AM  Consent Given By: the patient  Position: PRONE   Additional Comments: Vital signs were monitored before and after the procedure. Patient was prepped and draped in the usual sterile fashion. The correct patient, procedure, and site was verified.   Injection Procedure Details:  Procedure Site One Meds  Administered:  Meds ordered this encounter  Medications  . lidocaine (PF) (XYLOCAINE) 1 % injection 0.3 mL  . methylPREDNISolone acetate (DEPO-MEDROL) injection 80 mg      Laterality: Left  Location/Site:  L4-L5  Needle size: 22 G  Needle type: Spinal  Needle Placement: Transforaminal  Findings:  -Contrast Used: 1 mL iohexol 180 mg iodine/mL   -Comments: Excellent flow of contrast along the nerve and into the epidural space.  Procedure Details: After squaring off the end-plates of the desired vertebral level to get a true AP view, the C-arm was obliqued to the painful side so that the superior articulating process is positioned about 1/3 the length of the inferior endplate.  The needle was aimed toward the junction of the superior articular process and the transverse process of the inferior vertebrae. The needle's initial entry is in the lower third of the foramen through Kambin's triangle. The soft tissues overlying this target were infiltrated with 2-3 ml. of 1% Lidocaine without Epinephrine.  The spinal needle was then inserted and advanced toward the target using a "trajectory" view along the fluoroscope beam.  Under AP and lateral visualization, the needle was advanced so it did not puncture dura and did not traverse medially beyond the 6 o'clock position of the pedicle. Bi-planar projections were used to confirm position. Aspiration was confirmed to be negative for CSF and/or blood. A 1-2 ml. volume of Isovue-250 was injected and flow of contrast was noted at each level. Radiographs were obtained for documentation purposes.   After attaining the  desired flow of contrast documented above, a 0.5 to 1.0 ml test dose of 0.25% Marcaine was injected into each respective transforaminal space.  The patient was observed for 90 seconds post injection.  After no sensory deficits were reported, and normal lower extremity motor function was noted,   the above injectate was administered so that  equal amounts of the injectate were placed at each foramen (level) into the transforaminal epidural space.   Additional Comments:  The patient tolerated the procedure well Dressing: Band-Aid    Post-procedure details: Patient was observed during the procedure. Post-procedure instructions were reviewed.  Patient left the clinic in stable condition.   Clinical History: Cervical MRI 08/30/2016 IMPRESSION: Mild progressive degenerative changes when compared to prior exam. Summary of pertinent findings includes:  C1-2 degenerative changes with small amount of fluid right C1-2 lateral mass articulation.  C2-3 no significant spinal stenosis or foraminal narrowing.  C3-4 multifactorial mild to moderate foraminal narrowing greater on the left. Vertebral arteries extends into neural foramen. Spur posterior superior aspect of the C4 vertebra greatest left paracentral position. Mild indentation ventral thecal sac and minimal left-sided cord contact.  C4-5 broad-based disc osteophyte complex greater to the right. Spinal stenosis and cord flattening greater on the right. Multifactorial marked bilateral foraminal narrowing greater on the right.  C5-6 broad-based disc osteophyte complex greater to the right. Mild right-sided cord flattening. Multifactorial mild to moderate right foraminal narrowing.  C6-7 broad-based disc osteophyte complex slightly greater to left. Ventral thecal sac narrowing with minimal left-sided cord contact. Multifactorial marked bilateral foraminal narrowing.  C7-T1 bulge and spur with narrowing ventral thecal sac. Moderate bilateral foraminal narrowing.  He reports that he has never smoked. He has never used smokeless tobacco.   Recent Labs  03/11/16 1357  HGBA1C 5.8    Objective:  VS:  HT:    WT:   BMI:     BP:(!) 154/96  HR:84bpm  TEMP:98.4 F (36.9 C)( )  RESP:95 % Physical Exam  Musculoskeletal:  The patient ambulates without aid with a  forward flexed spine with good distal strength.    Ortho Exam Imaging: No results found.  Past Medical/Family/Surgical/Social History: Medications & Allergies reviewed per EMR Patient Active Problem List   Diagnosis Date Noted  . Spinal stenosis of lumbar region with neurogenic claudication 01/15/2017  . Spondylolisthesis of lumbar region 01/15/2017  . Claw toe, acquired, left 01/06/2017  . Achilles tendon contracture, bilateral 01/06/2017  . Pain in metatarsus of both feet 01/06/2017  . Tubular adenoma of colon 12/29/2016  . Basal cell carcinoma 08/19/2016  . Umbilical hernia s/p lap repair with mesh 06/27/2016 06/27/2016  . Degenerative arthritis of knee 03/22/2015  . Dyspnea on exertion 01/31/2015  . Lumbar radiculopathy 12/12/2014  . Facet hypertrophy of lumbar region 12/12/2014  . Left knee pain 12/12/2014  . Ulcer of lower limb ([place]) 01/10/2014  . Varicose veins of lower extremities with other complications 83/38/2505  . 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 Renal insufficiency 12/21/2013  . Swelling in head/neck 07/28/2012  . Venous (peripheral) insufficiency 05/25/2012  . 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 Pain in limb 02/12/2012  . Hyperlipidemia 02/28/2008  . Allergic rhinitis 02/28/2008  . Sleep apnea 02/28/2008   Past Medical History:  Diagnosis Date  . Allergy    takes Zyrtec daily  . Anxiety    takes Ativan daily as needed  . Arthritis   . Back pain   . BPH (benign prostatic hyperplasia)  takes doxazosin for  . 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 Depression    takes Wellbutrin daily  . Dyspnea on exertion    with exertion  . GERD (gastroesophageal reflux disease)    takes Nexium and Omeprazole daily  . History of kidney stones   . Hx of [facility] spotted fever childhood  . Hyperlipidemia   . Neuromuscular disorder ([place])   . Sleep apnea   . Swelling of lower extremity    right more than  leg leg  . Umbilical hernia    Family History  Problem Relation Age of Onset  . Thyroid disease Mother   . Heart disease Father   . Diabetes Maternal Grandfather    Past Surgical History:  Procedure Laterality Date  . 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 BACK SURGERY  2003   L4, L5  . BACK SURGERY    . ENDOVENOUS ABLATION SAPHENOUS VEIN W/ LASER Right 06-01-2014   EVLA right greater saphenous vein by Curt Jews MD   . epidural steroid injections        [facility] dr Taylar Hartsough  . hernia repair  2003  . HERNIA REPAIR     hernia inguinal x 2  . TOTAL KNEE ARTHROPLASTY Left 03/22/2015   Procedure: TOTAL KNEE ARTHROPLASTY;  Surgeon: Meredith Pel, MD;  Location: [place];  Service: Orthopedics;  Laterality: Left;  . UMBILICAL HERNIA REPAIR N/A 06/27/2016   Procedure: LAPAROSCOPIC ASSISTED REPAIR OF UMBILICAL HERINA WITH MESH;  Surgeon: Johnathan Hausen, MD;  Location: [facility];  Service: General;  Laterality: N/A;  . VASCULAR SURGERY  2015   right leg   Social History   Occupational History  . [employer]iques/Real Estate Retired   Social History Main Topics  . Smoking status: Never Smoker  . Smokeless tobacco: Never Used  . Alcohol use No  . Drug use: No  . Sexual activity: Yes    Birth control/ protection: Condom

## 2017-02-05 DIAGNOSIS — R3121 Asymptomatic microscopic hematuria: Secondary | ICD-10-CM | POA: Diagnosis not present

## 2017-02-05 NOTE — Procedures (Signed)
Lumbosacral Transforaminal Epidural Steroid Injection - Infraneural Approach with Fluoroscopic Guidance  Patient: [name]      Date of Birth: Feb 17, 1941 MRN: [id number] PCP: No PCP Per Patient      Visit Date: 02/03/2017   Universal Protocol:    Date/Time: 03/29/185:53 AM  Consent Given By: the patient  Position: PRONE   Additional Comments: Vital signs were monitored before and after the procedure. Patient was prepped and draped in the usual sterile fashion. The correct patient, procedure, and site was verified.   Injection Procedure Details:  Procedure Site One Meds Administered:  Meds ordered this encounter  Medications  . lidocaine (PF) (XYLOCAINE) 1 % injection 0.3 mL  . methylPREDNISolone acetate (DEPO-MEDROL) injection 80 mg      Laterality: Left  Location/Site:  L4-L5  Needle size: 22 G  Needle type: Spinal  Needle Placement: Transforaminal  Findings:  -Contrast Used: 1 mL iohexol 180 mg iodine/mL   -Comments: Excellent flow of contrast along the nerve and into the epidural space.  Procedure Details: After squaring off the end-plates of the desired vertebral level to get a true AP view, the C-arm was obliqued to the painful side so that the superior articulating process is positioned about 1/3 the length of the inferior endplate.  The needle was aimed toward the junction of the superior articular process and the transverse process of the inferior vertebrae. The needle's initial entry is in the lower third of the foramen through Kambin's triangle. The soft tissues overlying this target were infiltrated with 2-3 ml. of 1% Lidocaine without Epinephrine.  The spinal needle was then inserted and advanced toward the target using a "trajectory" view along the fluoroscope beam.  Under AP and lateral visualization, the needle was advanced so it did not puncture dura and did not traverse medially beyond the 6 o'clock position of the pedicle. Bi-planar projections  were used to confirm position. Aspiration was confirmed to be negative for CSF and/or blood. A 1-2 ml. volume of Isovue-250 was injected and flow of contrast was noted at each level. Radiographs were obtained for documentation purposes.   After attaining the desired flow of contrast documented above, a 0.5 to 1.0 ml test dose of 0.25% Marcaine was injected into each respective transforaminal space.  The patient was observed for 90 seconds post injection.  After no sensory deficits were reported, and normal lower extremity motor function was noted,   the above injectate was administered so that equal amounts of the injectate were placed at each foramen (level) into the transforaminal epidural space.   Additional Comments:  The patient tolerated the procedure well Dressing: Band-Aid    Post-procedure details: Patient was observed during the procedure. Post-procedure instructions were reviewed.  Patient left the clinic in stable condition.

## 2017-02-06 ENCOUNTER — Telehealth (HOSPITAL_COMMUNITY): Payer: Self-pay

## 2017-02-06 NOTE — Telephone Encounter (Signed)
Encounter complete. 

## 2017-02-10 ENCOUNTER — Telehealth (HOSPITAL_COMMUNITY): Payer: Self-pay

## 2017-02-10 NOTE — Telephone Encounter (Signed)
Encounter complete. 

## 2017-02-11 ENCOUNTER — Ambulatory Visit (HOSPITAL_COMMUNITY)
Admission: RE | Admit: 2017-02-11 | Discharge: 2017-02-11 | Disposition: A | Discharge status: Home / Self Care | Payer: Medicare Other | Source: Ambulatory Visit | Attending: Cardiology | Admitting: Cardiology

## 2017-02-11 DIAGNOSIS — N401 Enlarged prostate with lower urinary tract symptoms: Secondary | ICD-10-CM | POA: Diagnosis not present

## 2017-02-11 DIAGNOSIS — R3915 Urgency of urination: Secondary | ICD-10-CM | POA: Diagnosis not present

## 2017-02-11 DIAGNOSIS — R0602 Shortness of breath: Secondary | ICD-10-CM

## 2017-02-11 DIAGNOSIS — E291 Testicular hypofunction: Secondary | ICD-10-CM | POA: Diagnosis not present

## 2017-02-17 ENCOUNTER — Telehealth (HOSPITAL_COMMUNITY): Payer: Self-pay

## 2017-02-17 DIAGNOSIS — H35371 Puckering of macula, right eye: Secondary | ICD-10-CM | POA: Diagnosis not present

## 2017-02-17 DIAGNOSIS — H25813 Combined forms of age-related cataract, bilateral: Secondary | ICD-10-CM | POA: Diagnosis not present

## 2017-02-17 NOTE — Telephone Encounter (Signed)
Encounter complete. 

## 2017-02-18 ENCOUNTER — Other Ambulatory Visit: Payer: Self-pay

## 2017-02-18 ENCOUNTER — Ambulatory Visit (HOSPITAL_COMMUNITY): Payer: Medicare Other | Attending: Cardiology

## 2017-02-18 DIAGNOSIS — I10 Essential (primary) hypertension: Secondary | ICD-10-CM | POA: Insufficient documentation

## 2017-02-18 DIAGNOSIS — R0602 Shortness of breath: Secondary | ICD-10-CM | POA: Diagnosis not present

## 2017-02-19 ENCOUNTER — Ambulatory Visit (HOSPITAL_COMMUNITY)
Admission: RE | Admit: 2017-02-19 | Discharge: 2017-02-19 | Disposition: A | Discharge status: Home / Self Care | Payer: Medicare Other | Source: Ambulatory Visit | Attending: Cardiovascular Disease | Admitting: Cardiovascular Disease

## 2017-02-19 DIAGNOSIS — G4733 Obstructive sleep apnea (adult) (pediatric): Secondary | ICD-10-CM | POA: Insufficient documentation

## 2017-02-19 DIAGNOSIS — R0602 Shortness of breath: Secondary | ICD-10-CM

## 2017-02-19 DIAGNOSIS — R0609 Other forms of dyspnea: Secondary | ICD-10-CM | POA: Insufficient documentation

## 2017-02-19 DIAGNOSIS — Z8249 Family history of ischemic heart disease and other diseases of the circulatory system: Secondary | ICD-10-CM | POA: Diagnosis not present

## 2017-02-19 LAB — MYOCARDIAL PERFUSION IMAGING
CHL CUP NUCLEAR SDS: 2
CHL CUP NUCLEAR SRS: 1
CHL CUP RESTING HR STRESS: 52 {beats}/min
CSEPPHR: 75 {beats}/min
LV dias vol: 138 mL (ref 62–150)
LVSYSVOL: 76 mL
NUC STRESS TID: 1.1
SSS: 3

## 2017-02-19 MED ORDER — AMINOPHYLLINE 25 MG/ML IV SOLN
75.0000 mg | Freq: Once | INTRAVENOUS | Status: AC
  Administered 2017-02-19: 75 mg via INTRAVENOUS

## 2017-02-19 MED ORDER — TECHNETIUM TC 99M TETROFOSMIN IV KIT
9.9000 | PACK | Freq: Once | INTRAVENOUS | Status: AC | PRN
  Administered 2017-02-19: 9.9 via INTRAVENOUS
  Filled 2017-02-19: qty 10

## 2017-02-19 MED ORDER — TECHNETIUM TC 99M TETROFOSMIN IV KIT
31.4000 | PACK | Freq: Once | INTRAVENOUS | Status: AC | PRN
  Administered 2017-02-19: 31.4 via INTRAVENOUS
  Filled 2017-02-19: qty 32

## 2017-02-19 MED ORDER — REGADENOSON 0.4 MG/5ML IV SOLN
0.4000 mg | Freq: Once | INTRAVENOUS | Status: AC
  Administered 2017-02-19: 0.4 mg via INTRAVENOUS

## 2017-02-24 ENCOUNTER — Telehealth: Payer: Self-pay | Admitting: Cardiovascular Disease

## 2017-02-24 NOTE — Telephone Encounter (Signed)
ECHOCARDIOGRAM COMPLETE  Order: [id number]  Status:  Final result Visible to patient:  No (Not Released) Dx:  SOB (shortness of breath)  Notes recorded by Therisa Doyne on 02/24/2017 at 2:37 PM EDT Left detail message with results, ok per DPR, and to call back if any questions.   ------  Notes recorded by Lorretta Harp, MD on 02/18/2017 at 5:34 PM EDT Essentially normal study. Repeat when clinically indicated.

## 2017-02-24 NOTE — Telephone Encounter (Signed)
Myocardial Perfusion Imaging  Order: [id number]  Status:  Final result Visible to patient:  No (Not Released) Dx:  SOB (shortness of breath)  Notes recorded by Therisa Doyne on 02/24/2017 at 1:51 PM EDT Left detail message with results, ok per DPR, and to call back if any questions.   ------  Notes recorded by Lorretta Harp, MD on 02/20/2017 at 6:29 AM EDT Essentially normal study. Repeat when clinically indicated.

## 2017-02-25 ENCOUNTER — Other Ambulatory Visit: Payer: Self-pay | Admitting: Physician Assistant

## 2017-02-25 DIAGNOSIS — M5440 Lumbago with sciatica, unspecified side: Secondary | ICD-10-CM

## 2017-02-27 ENCOUNTER — Telehealth (INDEPENDENT_AMBULATORY_CARE_PROVIDER_SITE_OTHER): Payer: Self-pay

## 2017-02-27 DIAGNOSIS — R600 Localized edema: Secondary | ICD-10-CM | POA: Diagnosis not present

## 2017-02-27 DIAGNOSIS — M545 Low back pain: Secondary | ICD-10-CM | POA: Diagnosis not present

## 2017-02-27 DIAGNOSIS — G6289 Other specified polyneuropathies: Secondary | ICD-10-CM | POA: Diagnosis not present

## 2017-02-27 NOTE — Telephone Encounter (Signed)
Patient left vm requesting an injection for right side low back pain. Had Lt L4-5 TF on 02/03/17 and he said no pain on left now and only pain on right.

## 2017-03-02 NOTE — Telephone Encounter (Signed)
Scheduled for 03/11/17 at 1445 with driver and no blood thinners.

## 2017-03-02 NOTE — Telephone Encounter (Signed)
Left message for pt to call back for scheduling.

## 2017-03-02 NOTE — Telephone Encounter (Signed)
Ok x 1

## 2017-03-03 DIAGNOSIS — E291 Testicular hypofunction: Secondary | ICD-10-CM | POA: Diagnosis not present

## 2017-03-03 DIAGNOSIS — K219 Gastro-esophageal reflux disease without esophagitis: Secondary | ICD-10-CM | POA: Diagnosis not present

## 2017-03-03 DIAGNOSIS — N401 Enlarged prostate with lower urinary tract symptoms: Secondary | ICD-10-CM | POA: Diagnosis not present

## 2017-03-03 DIAGNOSIS — R7303 Prediabetes: Secondary | ICD-10-CM | POA: Diagnosis not present

## 2017-03-03 DIAGNOSIS — J301 Allergic rhinitis due to pollen: Secondary | ICD-10-CM | POA: Diagnosis not present

## 2017-03-03 DIAGNOSIS — G4733 Obstructive sleep apnea (adult) (pediatric): Secondary | ICD-10-CM | POA: Diagnosis not present

## 2017-03-03 DIAGNOSIS — R600 Localized edema: Secondary | ICD-10-CM | POA: Diagnosis not present

## 2017-03-03 DIAGNOSIS — E782 Mixed hyperlipidemia: Secondary | ICD-10-CM | POA: Diagnosis not present

## 2017-03-03 DIAGNOSIS — F411 Generalized anxiety disorder: Secondary | ICD-10-CM | POA: Diagnosis not present

## 2017-03-03 DIAGNOSIS — M48062 Spinal stenosis, lumbar region with neurogenic claudication: Secondary | ICD-10-CM | POA: Diagnosis not present

## 2017-03-11 ENCOUNTER — Encounter (INDEPENDENT_AMBULATORY_CARE_PROVIDER_SITE_OTHER): Payer: Self-pay | Admitting: Physical Medicine and Rehabilitation

## 2017-03-11 ENCOUNTER — Encounter (INDEPENDENT_AMBULATORY_CARE_PROVIDER_SITE_OTHER): Payer: Self-pay

## 2017-03-11 ENCOUNTER — Ambulatory Visit (INDEPENDENT_AMBULATORY_CARE_PROVIDER_SITE_OTHER): Payer: Medicare Other | Admitting: Physical Medicine and Rehabilitation

## 2017-03-11 ENCOUNTER — Ambulatory Visit (INDEPENDENT_AMBULATORY_CARE_PROVIDER_SITE_OTHER): Payer: Medicare Other

## 2017-03-11 VITALS — BP 134/68 | HR 71

## 2017-03-11 DIAGNOSIS — M5416 Radiculopathy, lumbar region: Secondary | ICD-10-CM | POA: Diagnosis not present

## 2017-03-11 DIAGNOSIS — M48062 Spinal stenosis, lumbar region with neurogenic claudication: Secondary | ICD-10-CM | POA: Diagnosis not present

## 2017-03-11 MED ORDER — LIDOCAINE HCL (PF) 1 % IJ SOLN
2.0000 mL | Freq: Once | INTRAMUSCULAR | Status: AC
  Administered 2017-03-11: 2 mL

## 2017-03-11 MED ORDER — METHYLPREDNISOLONE ACETATE 80 MG/ML IJ SUSP
80.0000 mg | Freq: Once | INTRAMUSCULAR | Status: AC
  Administered 2017-03-11: 80 mg

## 2017-03-11 MED ORDER — IIOPAMIDOL (ISOVUE-250) INJECTION 51%
3.0000 mL | Freq: Once | INTRAVENOUS | Status: AC
  Administered 2017-03-11: 3 mL
  Filled 2017-03-11: qty 50

## 2017-03-11 NOTE — Progress Notes (Signed)
[name] - 76 y.o. male MRN [id number]  Date of birth: [birth year]  Office Visit Note: Visit Date: 03/11/2017 PCP: No PCP Per Patient Referred by: No ref. provider found  Subjective: Chief Complaint  Patient presents with  . Lower Back - Pain   HPI: [name] is a 76 year old gentleman with known stenosis and listhesis of L4 on L5 and also stenosis at L5-S1. Last injection was a left L4 transforaminal epidural steroid injection with good relief of his left-sided complaints. He is now having right-sided symptoms again. At this point I think is wise to try one more injection if he gets good relief for several months that may be something we can repeat. We have had conversations over and over with him considering the fact that he does have the stenosis and it's not going anywhere if not getting worse. Depending on relief I would probably update his lumbar spine MRI. He would probably be a good surgical candidate although that would be a fairly decent surgery with likely fusion. He could ultimately be a spinal cord stimulator trial candidate.    ROS Otherwise per HPI.  Assessment & Plan: Visit Diagnoses:  1. Lumbar radiculopathy   2. Spinal stenosis of lumbar region with neurogenic claudication     Plan: Findings:   Right L4 transforaminal epidural steroid injection.    Meds & Orders:  Meds ordered this encounter  Medications  . lidocaine (PF) (XYLOCAINE) 1 % injection 2 mL  . iopamidol (ISOVUE-250) 51 % injection 3 mL  . methylPREDNISolone acetate (DEPO-MEDROL) injection 80 mg    Orders Placed This Encounter  Procedures  . XR C-ARM NO REPORT  . Epidural Steroid injection    Follow-up: Return if symptoms worsen or fail to improve.   Procedures: No procedures performed  Lumbosacral Transforaminal Epidural Steroid Injection - Infraneural Approach with Fluoroscopic Guidance  Patient: [name]      Date of Birth: 05-Feb-1941 MRN: [id number] PCP: No PCP Per  Patient      Visit Date: 03/11/2017   Universal Protocol:    Date/Time: 05/02/183:00 PM  Consent Given By: the patient  Position: PRONE   Additional Comments: Vital signs were monitored before and after the procedure. Patient was prepped and draped in the usual sterile fashion. The correct patient, procedure, and site was verified.   Injection Procedure Details:  Procedure Site One Meds Administered:  Meds ordered this encounter  Medications  . lidocaine (PF) (XYLOCAINE) 1 % injection 2 mL  . iopamidol (ISOVUE-250) 51 % injection 3 mL  . methylPREDNISolone acetate (DEPO-MEDROL) injection 80 mg      Laterality: Right  Location/Site:  L4-L5  Needle size: 22 G  Needle type: Spinal  Needle Placement: Transforaminal  Findings:  -Contrast Used: 1 mL iohexol 180 mg iodine/mL   -Comments: Excellent flow of contrast along the nerve and into the epidural space.  Procedure Details: After squaring off the end-plates of the desired vertebral level to get a true AP view, the C-arm was obliqued to the painful side so that the superior articulating process is positioned about 1/3 the length of the inferior endplate.  The needle was aimed toward the junction of the superior articular process and the transverse process of the inferior vertebrae. The needle's initial entry is in the lower third of the foramen through Kambin's triangle. The soft tissues overlying this target were infiltrated with 2-3 ml. of 1% Lidocaine without Epinephrine.  The spinal needle was then inserted and advanced toward  the target using a "trajectory" view along the fluoroscope beam.  Under AP and lateral visualization, the needle was advanced so it did not puncture dura and did not traverse medially beyond the 6 o'clock position of the pedicle. Bi-planar projections were used to confirm position. Aspiration was confirmed to be negative for CSF and/or blood. A 1-2 ml. volume of Isovue-250 was injected and flow of  contrast was noted at each level. Radiographs were obtained for documentation purposes.   After attaining the desired flow of contrast documented above, a 0.5 to 1.0 ml test dose of 0.25% Marcaine was injected into each respective transforaminal space.  The patient was observed for 90 seconds post injection.  After no sensory deficits were reported, and normal lower extremity motor function was noted,   the above injectate was administered so that equal amounts of the injectate were placed at each foramen (level) into the transforaminal epidural space.   Additional Comments:  The patient tolerated the procedure well Dressing: Band-Aid    Post-procedure details: Patient was observed during the procedure. Post-procedure instructions were reviewed.  Patient left the clinic in stable condition.   Clinical History: Lumbar Spine MRI 2014 L3-4:  A diffuse bulging annulus, short pedicles and facet disease contribute to moderate spinal and bilateral lateral recess stenosis and mild bilateral foraminal stenosis.  This appears relatively stable.  L4-5:  Degenerative anterolisthesis of L4 with a bulging uncovered disc, short pedicles and severe facet disease contributing to moderately severe spinal and bilateral lateral recess stenosis and moderately severe bilateral foraminal stenosis.  These findings relatively stable.  L5-S1:  Persistent focal central disc protrusion and severe facet disease contributing to spinal and lateral recess stenosis with persistent mass effect on the right S1 nerve root.  No significant foraminal stenosis.  IMPRESSION: Severe degenerative lumbar spondylosis with multilevel multifactorial spinal, lateral recess and foraminal stenosis as specifically discussed above at the individual levels.  No significant interval change when compared the prior study.  Cervical MRI 08/30/2016 IMPRESSION: Mild progressive degenerative changes when compared to prior  exam. Summary of pertinent findings includes:  C1-2 degenerative changes with small amount of fluid right C1-2 lateral mass articulation.  C2-3 no significant spinal stenosis or foraminal narrowing.  C3-4 multifactorial mild to moderate foraminal narrowing greater on the left. Vertebral arteries extends into neural foramen. Spur posterior superior aspect of the C4 vertebra greatest left paracentral position. Mild indentation ventral thecal sac and minimal left-sided cord contact.  C4-5 broad-based disc osteophyte complex greater to the right. Spinal stenosis and cord flattening greater on the right. Multifactorial marked bilateral foraminal narrowing greater on the right.  C5-6 broad-based disc osteophyte complex greater to the right. Mild right-sided cord flattening. Multifactorial mild to moderate right foraminal narrowing.  C6-7 broad-based disc osteophyte complex slightly greater to left. Ventral thecal sac narrowing with minimal left-sided cord contact. Multifactorial marked bilateral foraminal narrowing.  C7-T1 bulge and spur with narrowing ventral thecal sac. Moderate bilateral foraminal narrowing.  He reports that he has never smoked. He has never used smokeless tobacco. No results for input(s): HGBA1C, LABURIC in the last 8760 hours.  Objective:  VS:  HT:    WT:   BMI:     BP:134/68  HR:71bpm  TEMP: ( )  RESP:94 % Physical Exam  Musculoskeletal:  The patient ambulates without aid with a forward flexed spine with good distal strength.    Ortho Exam Imaging: Xr C-arm No Report  Result Date: 03/11/2017 Please see Notes or Procedures tab for  imaging impression.   Past Medical/Family/Surgical/Social History: Medications & Allergies reviewed per EMR Patient Active Problem List   Diagnosis Date Noted  . Spinal stenosis of lumbar region with neurogenic claudication 01/15/2017  . Spondylolisthesis of lumbar region 01/15/2017  . Claw toe, acquired, left 01/06/2017   . Achilles tendon contracture, bilateral 01/06/2017  . Pain in metatarsus of both feet 01/06/2017  . Tubular adenoma of colon 12/29/2016  . Basal cell carcinoma 08/19/2016  . Umbilical hernia s/p lap repair with mesh 06/27/2016 06/27/2016  . Degenerative arthritis of knee 03/22/2015  . Dyspnea on exertion 01/31/2015  . Lumbar radiculopathy 12/12/2014  . Facet hypertrophy of lumbar region 12/12/2014  . Left knee pain 12/12/2014  . Ulcer of lower limb ([place]) 01/10/2014  . Varicose veins of lower extremities with other complications 86/76/1950  . 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 Renal insufficiency 12/21/2013  . Swelling in head/neck 07/28/2012  . Venous (peripheral) insufficiency 05/25/2012  . 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 Pain in limb 02/12/2012  . Hyperlipidemia 02/28/2008  . Allergic rhinitis 02/28/2008  . Sleep apnea 02/28/2008   Past Medical History:  Diagnosis Date  . Allergy    takes Zyrtec daily  . Anxiety    takes Ativan daily as needed  . Arthritis   . Back pain   . BPH (benign prostatic hyperplasia)    takes doxazosin for  . 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 Depression    takes Wellbutrin daily  . Dyspnea on exertion    with exertion  . GERD (gastroesophageal reflux disease)    takes Nexium and Omeprazole daily  . History of kidney stones   . 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 Hyperlipidemia   . Neuromuscular disorder ([place])   . Sleep apnea   . Swelling of lower extremity    right more than leg leg  . Umbilical hernia    Family History  Problem Relation Age of Onset  . Thyroid disease Mother   . Heart disease Father   . Diabetes Maternal Grandfather    Past Surgical History:  Procedure Laterality Date  . 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 BACK SURGERY  2003   L4, L5  . BACK SURGERY    . ENDOVENOUS ABLATION SAPHENOUS VEIN W/ LASER Right 06-01-2014    EVLA right greater saphenous vein by Curt Jews MD   . epidural steroid injections        [facility] dr Ernesha Ramone  . hernia repair  2003  . HERNIA REPAIR     hernia inguinal x 2  . TOTAL KNEE ARTHROPLASTY Left 03/22/2015   Procedure: TOTAL KNEE ARTHROPLASTY;  Surgeon: Meredith Pel, MD;  Location: [place];  Service: Orthopedics;  Laterality: Left;  . UMBILICAL HERNIA REPAIR N/A 06/27/2016   Procedure: LAPAROSCOPIC ASSISTED REPAIR OF UMBILICAL HERINA WITH MESH;  Surgeon: Johnathan Hausen, MD;  Location: [facility];  Service: General;  Laterality: N/A;  . VASCULAR SURGERY  2015   right leg   Social History   Occupational History  . Antiques/Real Estate Retired   Social History Main Topics  . Smoking status: Never Smoker  . Smokeless tobacco: Never Used  . Alcohol use No  . Drug use: No  . Sexual activity: Yes    Birth control/ protection: Condom

## 2017-03-11 NOTE — Patient Instructions (Signed)

## 2017-03-11 NOTE — Progress Notes (Deleted)
Right side low back pain. Comes and goes. Worse with increased activitu. Right foot feels hot when on feel a lot. States left side is doing well since last injection.

## 2017-03-11 NOTE — Procedures (Signed)
Lumbosacral Transforaminal Epidural Steroid Injection - Infraneural Approach with Fluoroscopic Guidance  Patient: [name]      Date of Birth: [birth year] MRN: [id number] PCP: No PCP Per Patient      Visit Date: 03/11/2017   Universal Protocol:    Date/Time: 05/02/183:00 PM  Consent Given By: the patient  Position: PRONE   Additional Comments: Vital signs were monitored before and after the procedure. Patient was prepped and draped in the usual sterile fashion. The correct patient, procedure, and site was verified.   Injection Procedure Details:  Procedure Site One Meds Administered:  Meds ordered this encounter  Medications  . lidocaine (PF) (XYLOCAINE) 1 % injection 2 mL  . iopamidol (ISOVUE-250) 51 % injection 3 mL  . methylPREDNISolone acetate (DEPO-MEDROL) injection 80 mg      Laterality: Right  Location/Site:  L4-L5  Needle size: 22 G  Needle type: Spinal  Needle Placement: Transforaminal  Findings:  -Contrast Used: 1 mL iohexol 180 mg iodine/mL   -Comments: Excellent flow of contrast along the nerve and into the epidural space.  Procedure Details: After squaring off the end-plates of the desired vertebral level to get a true AP view, the C-arm was obliqued to the painful side so that the superior articulating process is positioned about 1/3 the length of the inferior endplate.  The needle was aimed toward the junction of the superior articular process and the transverse process of the inferior vertebrae. The needle's initial entry is in the lower third of the foramen through Kambin's triangle. The soft tissues overlying this target were infiltrated with 2-3 ml. of 1% Lidocaine without Epinephrine.  The spinal needle was then inserted and advanced toward the target using a "trajectory" view along the fluoroscope beam.  Under AP and lateral visualization, the needle was advanced so it did not puncture dura and did not traverse medially beyond the 6 o'clock  position of the pedicle. Bi-planar projections were used to confirm position. Aspiration was confirmed to be negative for CSF and/or blood. A 1-2 ml. volume of Isovue-250 was injected and flow of contrast was noted at each level. Radiographs were obtained for documentation purposes.   After attaining the desired flow of contrast documented above, a 0.5 to 1.0 ml test dose of 0.25% Marcaine was injected into each respective transforaminal space.  The patient was observed for 90 seconds post injection.  After no sensory deficits were reported, and normal lower extremity motor function was noted,   the above injectate was administered so that equal amounts of the injectate were placed at each foramen (level) into the transforaminal epidural space.   Additional Comments:  The patient tolerated the procedure well Dressing: Band-Aid    Post-procedure details: Patient was observed during the procedure. Post-procedure instructions were reviewed.  Patient left the clinic in stable condition.

## 2017-03-13 ENCOUNTER — Other Ambulatory Visit: Payer: Self-pay | Admitting: Emergency Medicine

## 2017-03-13 DIAGNOSIS — F411 Generalized anxiety disorder: Secondary | ICD-10-CM

## 2017-03-13 MED ORDER — LORAZEPAM 0.5 MG PO TABS: ORAL_TABLET | ORAL | 0 refills | Status: DC

## 2017-03-13 NOTE — Telephone Encounter (Signed)
Called to [facility]

## 2017-03-13 NOTE — Telephone Encounter (Signed)
Please call patient to schedule visit to follow-up and establish care with new PCP. Lorazepam filled x 1 month.

## 2017-03-16 DIAGNOSIS — J069 Acute upper respiratory infection, unspecified: Secondary | ICD-10-CM | POA: Diagnosis not present

## 2017-03-16 DIAGNOSIS — R0602 Shortness of breath: Secondary | ICD-10-CM | POA: Diagnosis not present

## 2017-03-26 DIAGNOSIS — G4733 Obstructive sleep apnea (adult) (pediatric): Secondary | ICD-10-CM | POA: Diagnosis not present

## 2017-03-26 DIAGNOSIS — R7303 Prediabetes: Secondary | ICD-10-CM | POA: Diagnosis not present

## 2017-03-26 DIAGNOSIS — N183 Chronic kidney disease, stage 3 (moderate): Secondary | ICD-10-CM | POA: Diagnosis not present

## 2017-03-26 DIAGNOSIS — F411 Generalized anxiety disorder: Secondary | ICD-10-CM | POA: Diagnosis not present

## 2017-05-22 DIAGNOSIS — F411 Generalized anxiety disorder: Secondary | ICD-10-CM | POA: Diagnosis not present

## 2017-05-22 DIAGNOSIS — R140 Abdominal distension (gaseous): Secondary | ICD-10-CM | POA: Diagnosis not present

## 2017-05-22 DIAGNOSIS — L570 Actinic keratosis: Secondary | ICD-10-CM | POA: Diagnosis not present

## 2017-05-22 DIAGNOSIS — F5101 Primary insomnia: Secondary | ICD-10-CM | POA: Diagnosis not present

## 2017-06-15 ENCOUNTER — Telehealth (INDEPENDENT_AMBULATORY_CARE_PROVIDER_SITE_OTHER): Payer: Self-pay | Admitting: Radiology

## 2017-06-15 ENCOUNTER — Telehealth (INDEPENDENT_AMBULATORY_CARE_PROVIDER_SITE_OTHER): Payer: Self-pay | Admitting: *Deleted

## 2017-06-15 NOTE — Telephone Encounter (Signed)
Patient is wanitng to see Dr. Ernestina Patches about an injection for his back.  Please advise

## 2017-06-15 NOTE — Telephone Encounter (Signed)
Pt called stating he gets inj in left knee and is needing another one. I put him for 8/27 but he stated he may need to be seen sooner.

## 2017-06-16 NOTE — Telephone Encounter (Signed)
Tried calling patient. LMVM advising him that if he should need injection sooner would need to see different provider since Dr Marlou Sa would be out of office.

## 2017-06-16 NOTE — Telephone Encounter (Signed)
This has already been sched .

## 2017-06-16 NOTE — Telephone Encounter (Signed)
Ok for L4 transforaminal injection either side or bilateral

## 2017-06-23 ENCOUNTER — Telehealth: Payer: Self-pay | Admitting: Vascular Surgery

## 2017-06-23 DIAGNOSIS — I83202 Varicose veins of unspecified lower extremity with both ulcer of calf and inflammation: Secondary | ICD-10-CM | POA: Diagnosis not present

## 2017-06-23 DIAGNOSIS — R600 Localized edema: Secondary | ICD-10-CM | POA: Diagnosis not present

## 2017-06-23 NOTE — Telephone Encounter (Signed)
-----   Message from Rica Records, RN sent at 06/23/2017  7:58 AM EDT ----- Regarding: scheduling [name] is pt. of TFE. Laser ablation of R GSV done 06-01-2014.  Not seen in office since. Now, he has bilateral lower extremity pain, swelling, and difficulty moving. He needs new pt. appointment (> 3 years) and bilateral venous reflux study. He requests an appt. ASAP with TFE. OK to split lab and appt. If can get him in sooner. TFE will need venous reflux before he will see him.

## 2017-06-23 NOTE — Telephone Encounter (Signed)
Per instructions from [place], I scheduled this patient for an appointment on 08/18/17 at 12 noon for lab study and also to see TFE at 1pm. This is his first available new patient appt.  I mailed new patient information to the patient as well as place him on a wait/cancellation list. awt

## 2017-06-24 DIAGNOSIS — R5383 Other fatigue: Secondary | ICD-10-CM | POA: Diagnosis not present

## 2017-06-24 DIAGNOSIS — R140 Abdominal distension (gaseous): Secondary | ICD-10-CM | POA: Diagnosis not present

## 2017-06-24 DIAGNOSIS — E6609 Other obesity due to excess calories: Secondary | ICD-10-CM | POA: Diagnosis not present

## 2017-06-25 DIAGNOSIS — H35373 Puckering of macula, bilateral: Secondary | ICD-10-CM | POA: Diagnosis not present

## 2017-06-25 DIAGNOSIS — H25811 Combined forms of age-related cataract, right eye: Secondary | ICD-10-CM | POA: Diagnosis not present

## 2017-06-25 DIAGNOSIS — H25812 Combined forms of age-related cataract, left eye: Secondary | ICD-10-CM | POA: Diagnosis not present

## 2017-06-29 DIAGNOSIS — I83202 Varicose veins of unspecified lower extremity with both ulcer of calf and inflammation: Secondary | ICD-10-CM | POA: Diagnosis not present

## 2017-06-30 ENCOUNTER — Ambulatory Visit (INDEPENDENT_AMBULATORY_CARE_PROVIDER_SITE_OTHER): Payer: Medicare Other | Admitting: Physical Medicine and Rehabilitation

## 2017-06-30 ENCOUNTER — Ambulatory Visit (INDEPENDENT_AMBULATORY_CARE_PROVIDER_SITE_OTHER): Payer: Medicare Other

## 2017-06-30 ENCOUNTER — Encounter (INDEPENDENT_AMBULATORY_CARE_PROVIDER_SITE_OTHER): Payer: Self-pay | Admitting: Physical Medicine and Rehabilitation

## 2017-06-30 VITALS — BP 145/81 | HR 64

## 2017-06-30 DIAGNOSIS — M5416 Radiculopathy, lumbar region: Secondary | ICD-10-CM | POA: Diagnosis not present

## 2017-06-30 DIAGNOSIS — M48062 Spinal stenosis, lumbar region with neurogenic claudication: Secondary | ICD-10-CM | POA: Diagnosis not present

## 2017-06-30 DIAGNOSIS — M4316 Spondylolisthesis, lumbar region: Secondary | ICD-10-CM | POA: Diagnosis not present

## 2017-06-30 DIAGNOSIS — I872 Venous insufficiency (chronic) (peripheral): Secondary | ICD-10-CM

## 2017-06-30 MED ORDER — BETAMETHASONE SOD PHOS & ACET 6 (3-3) MG/ML IJ SUSP
12.0000 mg | Freq: Once | INTRAMUSCULAR | Status: AC
  Administered 2017-06-30: 12 mg

## 2017-06-30 MED ORDER — LIDOCAINE HCL (PF) 1 % IJ SOLN
2.0000 mL | Freq: Once | INTRAMUSCULAR | Status: AC
  Administered 2017-06-30: 2 mL

## 2017-06-30 NOTE — Progress Notes (Deleted)
Pain across lower back. "It moves around." Pain down both legs. Comes and goes. Some days no pain at all. Can't relate to any certain movement or activity.

## 2017-06-30 NOTE — Patient Instructions (Signed)

## 2017-07-01 ENCOUNTER — Encounter (INDEPENDENT_AMBULATORY_CARE_PROVIDER_SITE_OTHER): Payer: Self-pay | Admitting: Physical Medicine and Rehabilitation

## 2017-07-01 DIAGNOSIS — H25812 Combined forms of age-related cataract, left eye: Secondary | ICD-10-CM | POA: Diagnosis not present

## 2017-07-01 NOTE — Procedures (Cosign Needed)
Lumbosacral Transforaminal Epidural Steroid Injection - Infraneural Approach with Fluoroscopic Guidance  Patient: [name]      Date of Birth: [birth year] MRN: [id number] PCP: Patient, No Pcp Per      Visit Date: 06/30/2017   Universal Protocol:     Consent Given By: the patient  Position: PRONE   Additional Comments: Vital signs were monitored before and after the procedure. Patient was prepped and draped in the usual sterile fashion. The correct patient, procedure, and site was verified.   Injection Procedure Details:  Procedure Site One Meds Administered:  Meds ordered this encounter  Medications  . lidocaine (PF) (XYLOCAINE) 1 % injection 2 mL  . betamethasone acetate-betamethasone sodium phosphate (CELESTONE) injection 12 mg      Laterality: Bilateral  Location/Site:  L4-L5  Needle size: 22 G  Needle type: Spinal  Needle Placement: Transforaminal  Findings:  -Contrast Used: 1 mL iohexol 180 mg iodine/mL   -Comments: Excellent flow of contrast along the nerve and into the epidural space.  Procedure Details: After squaring off the end-plates of the desired vertebral level to get a true AP view, the C-arm was obliqued to the painful side so that the superior articulating process is positioned about 1/3 the length of the inferior endplate.  The needle was aimed toward the junction of the superior articular process and the transverse process of the inferior vertebrae. The needle's initial entry is in the lower third of the foramen through Kambin's triangle. The soft tissues overlying this target were infiltrated with 2-3 ml. of 1% Lidocaine without Epinephrine.  The spinal needle was then inserted and advanced toward the target using a "trajectory" view along the fluoroscope beam.  Under AP and lateral visualization, the needle was advanced so it did not puncture dura and did not traverse medially beyond the 6 o'clock position of the pedicle. Bi-planar projections  were used to confirm position. Aspiration was confirmed to be negative for CSF and/or blood. A 1-2 ml. volume of Isovue-250 was injected and flow of contrast was noted at each level. Radiographs were obtained for documentation purposes.   After attaining the desired flow of contrast documented above, a 0.5 to 1.0 ml test dose of 0.25% Marcaine was injected into each respective transforaminal space.  The patient was observed for 90 seconds post injection.  After no sensory deficits were reported, and normal lower extremity motor function was noted,   the above injectate was administered so that equal amounts of the injectate were placed at each foramen (level) into the transforaminal epidural space.   Additional Comments:  The patient tolerated the procedure well Dressing: Band-Aid    Post-procedure details: Patient was observed during the procedure. Post-procedure instructions were reviewed.  Patient left the clinic in stable condition.

## 2017-07-01 NOTE — Addendum Note (Signed)
Addended by: Raymondo Band on: 07/01/2017 06:11 AM   Modules accepted: Level of Service

## 2017-07-01 NOTE — Progress Notes (Signed)
[name] - 76 y.o. male MRN [id number]  Date of birth: [birth year]  Office Visit Note: Visit Date: 06/30/2017 PCP: Patient, No Pcp Per Referred by: No ref. provider found  Subjective: Chief Complaint  Patient presents with  . Lower Back - Pain  . Left Lower Leg - Pain, Edema, Weakness  . Right Lower Leg - Pain, Edema, Weakness  . Right Knee - Pain   HPI: [name] is a 76 year old gentleman with chronic pain complaints and essentially chronic multiple joint and body part pain. He continues to work and does a lot of lifting at work. The last time we saw him was in May of this year and we completed a right L4 transforaminal epidural steroid injection with good relief of his symptoms up until just the past several weeks and almost a month. Essentially weak at about 2 and half months of relief of his low back and bilateral thigh pain. Prior to that injection a few weeks before that we completed bilateral L4 transforaminal epidural steroid injections. We have completed these injections on an intermittent ASIS and he is actually done fairly well just on the last corset didn't do as well and has not lasted as long. He has an MRI from 2014 showing moderate multifactorial stenosis at L4-5 with multilevel facet arthropathy. His symptoms are worsening pain across the low back. He says he can move around left and right but essentially bilateral. It's worse with standing and better at rest with sitting. He can be very intermittent and on some days and he has no pain at all on some days. He does get pain down both legs with paresthesia. He feels like his legs are getting weaker and weaker. He can't relate his pain to a certain activity other than he can be worse with movement. He does report once again that the injections in his lumbar spine have helped his back and legs when they are done.  Nonetheless the patient provides a long history today of venous insufficiency and prior procedures done on his  veins by Dr. early. He is actually contacted Dr. early recently for complaints of leg pain with increased swelling of both legs. Evidently there was some issue in getting back to see them because it had been several years now he is seeing someone at [facility]. They have evidently done a test of his venous system and I'm unsure which test they have done. He reports he is following up with them. He does get extensive swelling in both legs and does wear support stockings. He also complains about increasing girth around his midsection and he is working with his GI doctor all this as well. Again multiple somatic complaints.  Lastly he is followed by Dr. Marlou Sa in our office for his knees. He is scheduled to have any injections soon. He has a prior left total knee arthroplasty and pain in the right knee. Continues to have pain in the left knee and after arthroplasty. He reports the injections in the knee do help. These have ongoing symptoms of knee pain. He asked today about the effects of steroid medications in the injections. This is despite the fact that on every occasion of spoken with him we have actually talked about this at length.    Review of Systems  Constitutional: Negative for chills, fever, malaise/fatigue and weight loss.  HENT: Negative for hearing loss and sinus pain.   Eyes: Negative for blurred vision, double vision and photophobia.  Respiratory: Negative for  cough and shortness of breath.   Cardiovascular: Positive for leg swelling. Negative for chest pain and palpitations.  Gastrointestinal: Positive for abdominal pain. Negative for nausea and vomiting.  Genitourinary: Negative for flank pain.  Musculoskeletal: Positive for back pain and joint pain. Negative for myalgias.  Skin: Negative for itching and rash.  Neurological: Positive for tingling and weakness. Negative for tremors and focal weakness.  Endo/Heme/Allergies: Negative.   Psychiatric/Behavioral: Negative for depression.   All other systems reviewed and are negative.  Otherwise per HPI.  Assessment & Plan: Visit Diagnoses:  1. Lumbar radiculopathy   2. Spinal stenosis of lumbar region with neurogenic claudication   3. Spondylolisthesis of lumbar region   4. Venous (peripheral) insufficiency     Plan: Findings:  In terms of his back and leg pain and I think this is likely mostly lumbar stenosis type symptoms into the legs and we have had this discussion logically about him getting better with the injections and that is diagnostic in terms of the stenosis. We've also talked about his MRI being from 2014. At this point I think it would be wise to repeat the injection once again with gone over the fact that it is diagnostic and for him to really look at his symptoms to see if he gets better after the injection. We talked about steroid injections and steroid medications and the risk and benefits versus long-term steroids which is sort of a different problem. Intermittent injection should not be a problem with her not done very frequently. He does have other medical issues and we have to keep into account for this. If he doesn't get relief of his leg symptoms he is continue follow-up with his vascular doctors. If he didn't get relief with the injection at all that I think they should continue with the workup of his vascular complaints as this may be an issue as well. He does have significant swelling and is on Lasix. Again we have spoken at length today for over 30 minutes based on logically how to continue treating his symptoms would try to figure out where the source of his pain is coming from. I think there is some underlying anxiety depression as well may be a factor. I would update his MRI if he did not get much relief from the injection.  In terms of his knee pain is following up with Dr. Marlou Sa. We have discussed again the issues of steroid medication as long as her intermittently done well think there is an issue at  this point. We have done very few of the injections and is been several months apart we have diagnosed. It is something to continue to monitor which we always do.    Meds & Orders:  Meds ordered this encounter  Medications  . lidocaine (PF) (XYLOCAINE) 1 % injection 2 mL  . betamethasone acetate-betamethasone sodium phosphate (CELESTONE) injection 12 mg    Orders Placed This Encounter  Procedures  . XR C-ARM NO REPORT  . Epidural Steroid injection    Follow-up: Return if symptoms worsen or fail to improve.   Procedures: No procedures performed  Lumbosacral Transforaminal Epidural Steroid Injection - Infraneural Approach with Fluoroscopic Guidance  Patient: [name]      Date of Birth: 1941-07-24 MRN: [id number] PCP: Patient, No Pcp Per      Visit Date: 06/30/2017   Universal Protocol:     Consent Given By: the patient  Position: PRONE   Additional Comments: Vital signs were monitored before  and after the procedure. Patient was prepped and draped in the usual sterile fashion. The correct patient, procedure, and site was verified.   Injection Procedure Details:  Procedure Site One Meds Administered:  Meds ordered this encounter  Medications  . lidocaine (PF) (XYLOCAINE) 1 % injection 2 mL  . betamethasone acetate-betamethasone sodium phosphate (CELESTONE) injection 12 mg      Laterality: Bilateral  Location/Site:  L4-L5  Needle size: 22 G  Needle type: Spinal  Needle Placement: Transforaminal  Findings:  -Contrast Used: 1 mL iohexol 180 mg iodine/mL   -Comments: Excellent flow of contrast along the nerve and into the epidural space.  Procedure Details: After squaring off the end-plates of the desired vertebral level to get a true AP view, the C-arm was obliqued to the painful side so that the superior articulating process is positioned about 1/3 the length of the inferior endplate.  The needle was aimed toward the junction of the superior articular  process and the transverse process of the inferior vertebrae. The needle's initial entry is in the lower third of the foramen through Kambin's triangle. The soft tissues overlying this target were infiltrated with 2-3 ml. of 1% Lidocaine without Epinephrine.  The spinal needle was then inserted and advanced toward the target using a "trajectory" view along the fluoroscope beam.  Under AP and lateral visualization, the needle was advanced so it did not puncture dura and did not traverse medially beyond the 6 o'clock position of the pedicle. Bi-planar projections were used to confirm position. Aspiration was confirmed to be negative for CSF and/or blood. A 1-2 ml. volume of Isovue-250 was injected and flow of contrast was noted at each level. Radiographs were obtained for documentation purposes.   After attaining the desired flow of contrast documented above, a 0.5 to 1.0 ml test dose of 0.25% Marcaine was injected into each respective transforaminal space.  The patient was observed for 90 seconds post injection.  After no sensory deficits were reported, and normal lower extremity motor function was noted,   the above injectate was administered so that equal amounts of the injectate were placed at each foramen (level) into the transforaminal epidural space.   Additional Comments:  The patient tolerated the procedure well Dressing: Band-Aid    Post-procedure details: Patient was observed during the procedure. Post-procedure instructions were reviewed.  Patient left the clinic in stable condition.   Clinical History: Lumbar Spine MRI 2014 L3-4:  A diffuse bulging annulus, short pedicles and facet disease contribute to moderate spinal and bilateral lateral recess stenosis and mild bilateral foraminal stenosis.  This appears relatively stable.  L4-5:  Degenerative anterolisthesis of L4 with a bulging uncovered disc, short pedicles and severe facet disease contributing to moderately severe  spinal and bilateral lateral recess stenosis and moderately severe bilateral foraminal stenosis.  These findings relatively stable.  L5-S1:  Persistent focal central disc protrusion and severe facet disease contributing to spinal and lateral recess stenosis with persistent mass effect on the right S1 nerve root.  No significant foraminal stenosis.  IMPRESSION: Severe degenerative lumbar spondylosis with multilevel multifactorial spinal, lateral recess and foraminal stenosis as specifically discussed above at the individual levels.  No significant interval change when compared the prior study.  Cervical MRI 08/30/2016 IMPRESSION: Mild progressive degenerative changes when compared to prior exam. Summary of pertinent findings includes:  C1-2 degenerative changes with small amount of fluid right C1-2 lateral mass articulation.  C2-3 no significant spinal stenosis or foraminal narrowing.  C3-4 multifactorial mild  to moderate foraminal narrowing greater on the left. Vertebral arteries extends into neural foramen. Spur posterior superior aspect of the C4 vertebra greatest left paracentral position. Mild indentation ventral thecal sac and minimal left-sided cord contact.  C4-5 broad-based disc osteophyte complex greater to the right. Spinal stenosis and cord flattening greater on the right. Multifactorial marked bilateral foraminal narrowing greater on the right.  C5-6 broad-based disc osteophyte complex greater to the right. Mild right-sided cord flattening. Multifactorial mild to moderate right foraminal narrowing.  C6-7 broad-based disc osteophyte complex slightly greater to left. Ventral thecal sac narrowing with minimal left-sided cord contact. Multifactorial marked bilateral foraminal narrowing.  C7-T1 bulge and spur with narrowing ventral thecal sac. Moderate bilateral foraminal narrowing.  He reports that he has never smoked. He has never used smokeless tobacco. No  results for input(s): HGBA1C, LABURIC in the last 8760 hours.  Objective:  VS:  HT:    WT:   BMI:     BP:(!) 145/81  HR:64bpm  TEMP: ( )  RESP:95 % Physical Exam  Constitutional: He is oriented to person, place, and time. He appears well-developed and well-nourished. No distress.  HENT:  Head: Normocephalic and atraumatic.  Eyes: Pupils are equal, round, and reactive to light. Conjunctivae are normal.  Neck: Neck supple.  Cardiovascular: Regular rhythm and intact distal pulses.   Pulmonary/Chest: Effort normal. No respiratory distress.  Musculoskeletal:  Patient is slow to rise from a seated position. He has a forward flexed lumbar spine. He is stiff with extension and rotation which does cause pain. No pain over the greater trochanters and no pain with hip movement. Brief look at his right knee does not show any active swelling or joint line tenderness. He has good distal strength. He does have significant swelling in the bilateral lower legs.  Neurological: He is alert and oriented to person, place, and time.  Skin: Skin is warm and dry. No rash noted. No erythema.  Psychiatric: He has a normal mood and affect.  Nursing note and vitals reviewed.   Ortho Exam Imaging: Xr C-arm No Report  Result Date: 06/30/2017 Please see Notes or Procedures tab for imaging impression.   Past Medical/Family/Surgical/Social History: Medications & Allergies reviewed per EMR Patient Active Problem List   Diagnosis Date Noted  . Spinal stenosis of lumbar region with neurogenic claudication 01/15/2017  . Spondylolisthesis of lumbar region 01/15/2017  . Claw toe, acquired, left 01/06/2017  . Achilles tendon contracture, bilateral 01/06/2017  . Pain in metatarsus of both feet 01/06/2017  . Tubular adenoma of colon 12/29/2016  . Basal cell carcinoma 08/19/2016  . Umbilical hernia s/p lap repair with mesh 06/27/2016 06/27/2016  . Degenerative arthritis of knee 03/22/2015  . Dyspnea on exertion  01/31/2015  . Lumbar radiculopathy 12/12/2014  . Facet hypertrophy of lumbar region 12/12/2014  . Left knee pain 12/12/2014  . Ulcer of lower limb ([place]) 01/10/2014  . Varicose veins of lower extremities with other complications 21/19/4174  . 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 Renal insufficiency 12/21/2013  . Swelling in head/neck 07/28/2012  . Venous (peripheral) insufficiency 05/25/2012  . 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 Pain in limb 02/12/2012  . Hyperlipidemia 02/28/2008  . Allergic rhinitis 02/28/2008  . Sleep apnea 02/28/2008   Past Medical History:  Diagnosis Date  . Allergy    takes Zyrtec daily  . Anxiety    takes Ativan daily as needed  . Arthritis   . Back pain   . BPH (  benign prostatic hyperplasia)    takes doxazosin for  . 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 Depression    takes Wellbutrin daily  . Dyspnea on exertion    with exertion  . GERD (gastroesophageal reflux disease)    takes Nexium and Omeprazole daily  . History of kidney stones   . 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 Hyperlipidemia   . Neuromuscular disorder ([place])   . Sleep apnea   . Swelling of lower extremity    right more than leg leg  . Umbilical hernia    Family History  Problem Relation Age of Onset  . Thyroid disease Mother   . Heart disease Father   . Diabetes Maternal Grandfather    Past Surgical History:  Procedure Laterality Date  . 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 BACK SURGERY  2003   L4, L5  . BACK SURGERY    . ENDOVENOUS ABLATION SAPHENOUS VEIN W/ LASER Right 06-01-2014   EVLA right greater saphenous vein by Curt Jews MD   . epidural steroid injections        [facility] dr Jayzen Paver  . hernia repair  2003  . HERNIA REPAIR     hernia inguinal x 2  . TOTAL KNEE ARTHROPLASTY Left 03/22/2015   Procedure: TOTAL KNEE ARTHROPLASTY;  Surgeon: Meredith Pel, MD;  Location:  [facility];  Service: Orthopedics;  Laterality: Left;  . UMBILICAL HERNIA REPAIR N/A 06/27/2016   Procedure: LAPAROSCOPIC ASSISTED REPAIR OF UMBILICAL HERINA WITH MESH;  Surgeon: Johnathan Hausen, MD;  Location: [facility];  Service: General;  Laterality: N/A;  . VASCULAR SURGERY  2015   right leg   Social History   Occupational History  . [employer] Retired   Social History Main Topics  . Smoking status: Never Smoker  . Smokeless tobacco: Never Used  . Alcohol use No  . Drug use: No  . Sexual activity: Yes    Birth control/ protection: Condom

## 2017-07-06 ENCOUNTER — Ambulatory Visit (INDEPENDENT_AMBULATORY_CARE_PROVIDER_SITE_OTHER): Payer: Medicare Other | Admitting: Orthopedic Surgery

## 2017-07-06 ENCOUNTER — Encounter (INDEPENDENT_AMBULATORY_CARE_PROVIDER_SITE_OTHER): Payer: Self-pay | Admitting: Orthopedic Surgery

## 2017-07-06 DIAGNOSIS — Z7689 Persons encountering health services in other specified circumstances: Secondary | ICD-10-CM

## 2017-07-06 DIAGNOSIS — M1711 Unilateral primary osteoarthritis, right knee: Secondary | ICD-10-CM

## 2017-07-06 MED ORDER — HYDROCODONE-ACETAMINOPHEN 5-325 MG PO TABS: ORAL_TABLET | ORAL | 0 refills | Status: DC

## 2017-07-08 ENCOUNTER — Ambulatory Visit (INDEPENDENT_AMBULATORY_CARE_PROVIDER_SITE_OTHER): Payer: Medicare Other | Admitting: Orthopedic Surgery

## 2017-07-08 DIAGNOSIS — M898X7 Other specified disorders of bone, ankle and foot: Secondary | ICD-10-CM

## 2017-07-08 DIAGNOSIS — M1711 Unilateral primary osteoarthritis, right knee: Secondary | ICD-10-CM | POA: Diagnosis not present

## 2017-07-08 DIAGNOSIS — M205X2 Other deformities of toe(s) (acquired), left foot: Secondary | ICD-10-CM

## 2017-07-08 DIAGNOSIS — M6701 Short Achilles tendon (acquired), right ankle: Secondary | ICD-10-CM

## 2017-07-08 DIAGNOSIS — M6702 Short Achilles tendon (acquired), left ankle: Secondary | ICD-10-CM

## 2017-07-08 MED ORDER — METHYLPREDNISOLONE ACETATE 40 MG/ML IJ SUSP
40.0000 mg | INTRAMUSCULAR | Status: AC | PRN
  Administered 2017-07-08: 40 mg via INTRA_ARTICULAR

## 2017-07-08 MED ORDER — BUPIVACAINE HCL 0.25 % IJ SOLN
4.0000 mL | INTRAMUSCULAR | Status: AC | PRN
  Administered 2017-07-08: 4 mL via INTRA_ARTICULAR

## 2017-07-08 MED ORDER — LIDOCAINE HCL 1 % IJ SOLN
5.0000 mL | INTRAMUSCULAR | Status: AC | PRN
  Administered 2017-07-08: 5 mL

## 2017-07-08 NOTE — Progress Notes (Signed)
Office Visit Note   Patient: [name]           Date of Birth: 12/30/1940           MRN: [id number] Visit Date: 07/06/2017 Requested by: No referring provider defined for this encounter. PCP: Patient, No Pcp Per  Subjective: Chief Complaint  Patient presents with  . Right Knee - Pain    HPI: [name] is a 27 show patient with right knee pain.  Had left total knee replacement and that is doing marginally but reasonably well.  Right knee was injected to 2618.  Describes swelling weakness giving way but no locking or popping.  He would like to have left leg physical therapy.  He also has some foot issues but he seen Dr. Sharol Given next week for all of those issues to be addressed.  He like to have a repeat injection into the right knee today.              ROS: All systems reviewed are negative as they relate to the chief complaint within the history of present illness.  Patient denies  fevers or chills.   Assessment & Plan: Visit Diagnoses:  1. Referral of patient     Plan: Impression is right knee arthritis.  Plan is right knee aspiration and injection today along with physical therapy for left leg strengthening refill Norco one per day #45 with no refills.  Follow up with me as needed.  Follow-Up Instructions: Return if symptoms worsen or fail to improve.   Orders:  Orders Placed This Encounter  Procedures  . Ambulatory referral to Physical Therapy   Meds ordered this encounter  Medications  . HYDROcodone-acetaminophen (NORCO) 5-325 MG tablet    Sig: 1 po q d prn pain    Dispense:  45 tablet    Refill:  0      Procedures: Large Joint Inj Date/Time: 07/08/2017 9:30 PM Performed by: Meredith Pel Authorized by: Meredith Pel   Consent Given by:  Patient Site marked: the procedure site was marked   Timeout: prior to procedure the correct patient, procedure, and site was verified   Indications:  Pain, joint swelling and diagnostic evaluation Location:   Knee Site:  R knee Prep: patient was prepped and draped in usual sterile fashion   Needle Size:  18 G Needle Length:  1.5 inches Approach:  Superolateral Ultrasound Guidance: No   Fluoroscopic Guidance: No   Arthrogram: No   Medications:  5 mL lidocaine 1 %; 4 mL bupivacaine 0.25 %; 40 mg methylPREDNISolone acetate 40 MG/ML Aspiration Attempted: Yes   Aspirate amount (mL):  25 Aspirate:  Yellow Patient tolerance:  Patient tolerated the procedure well with no immediate complications     Clinical Data: No additional findings.  Objective: Vital Signs: There were no vitals taken for this visit.  Physical Exam:   Constitutional: Patient appears well-developed HEENT:  Head: Normocephalic Eyes:EOM are normal Neck: Normal range of motion Cardiovascular: Normal rate Pulmonary/chest: Effort normal Neurologic: Patient is alert Skin: Skin is warm Psychiatric: Patient has normal mood and affect    Ortho Exam: Orthopedic exam demonstrates excellent range of motion on the left total knee replacement to about 110.  No effusion in the left knee.  On the right knee there is an effusion and tenderness to palpation diffusely.  Pedal pulses palpable.  No groin pain with internal rotation leg.  No other masses lymph adenopathy or skin changes noted in the right knee  region.  Extensor mechanism is intact.  Specialty Comments:  No specialty comments available.  Imaging: No results found.   PMFS History: Patient Active Problem List   Diagnosis Date Noted  . Spinal stenosis of lumbar region with neurogenic claudication 01/15/2017  . Spondylolisthesis of lumbar region 01/15/2017  . Claw toe, acquired, left 01/06/2017  . Achilles tendon contracture, bilateral 01/06/2017  . Pain in metatarsus of both feet 01/06/2017  . Tubular adenoma of colon 12/29/2016  . Basal cell carcinoma 08/19/2016  . Umbilical hernia s/p lap repair with mesh 06/27/2016 06/27/2016  . Degenerative arthritis of knee  03/22/2015  . Dyspnea on exertion 01/31/2015  . Lumbar radiculopathy 12/12/2014  . Facet hypertrophy of lumbar region 12/12/2014  . Left knee pain 12/12/2014  . Ulcer of lower limb ([place]) 01/10/2014  . Varicose veins of lower extremities with other complications 74/06/1447  . 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 Renal insufficiency 12/21/2013  . Swelling in head/neck 07/28/2012  . Venous (peripheral) insufficiency 05/25/2012  . 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 Pain in limb 02/12/2012  . Hyperlipidemia 02/28/2008  . Allergic rhinitis 02/28/2008  . Sleep apnea 02/28/2008   Past Medical History:  Diagnosis Date  . Allergy    takes Zyrtec daily  . Anxiety    takes Ativan daily as needed  . Arthritis   . Back pain   . BPH (benign prostatic hyperplasia)    takes doxazosin for  . 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 Depression    takes Wellbutrin daily  . Dyspnea on exertion    with exertion  . GERD (gastroesophageal reflux disease)    takes Nexium and Omeprazole daily  . History of kidney stones   . Hx of [facility] spotted fever childhood  . Hyperlipidemia   . Neuromuscular disorder ([place])   . Sleep apnea   . Swelling of lower extremity    right more than leg leg  . Umbilical hernia     Family History  Problem Relation Age of Onset  . Thyroid disease Mother   . Heart disease Father   . Diabetes Maternal Grandfather     Past Surgical History:  Procedure Laterality Date  . 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 BACK SURGERY  2003   L4, L5  . BACK SURGERY    . ENDOVENOUS ABLATION SAPHENOUS VEIN W/ LASER Right 06-01-2014   EVLA right greater saphenous vein by Curt Jews MD   . epidural steroid injections        [facility] dr [facility]  . hernia repair  2003  . HERNIA REPAIR     hernia inguinal x 2  . TOTAL KNEE ARTHROPLASTY Left 03/22/2015   Procedure: TOTAL KNEE ARTHROPLASTY;   Surgeon: Meredith Pel, MD;  Location: [place];  Service: Orthopedics;  Laterality: Left;  . UMBILICAL HERNIA REPAIR N/A 06/27/2016   Procedure: LAPAROSCOPIC ASSISTED REPAIR OF UMBILICAL HERINA WITH MESH;  Surgeon: Johnathan Hausen, MD;  Location: [facility];  Service: General;  Laterality: N/A;  . VASCULAR SURGERY  2015   right leg   Social History   Occupational History  . [employer] Retired   Social History Main Topics  . Smoking status: Never Smoker  . Smokeless tobacco: Never Used  . Alcohol use No  . Drug use: No  . Sexual activity: Yes    Birth control/ protection: Condom

## 2017-07-08 NOTE — Progress Notes (Signed)
Office Visit Note   Patient: [name]           Date of Birth: 1941/04/10           MRN: [id number] Visit Date: 07/08/2017              Requested by: No referring provider defined for this encounter. PCP: Patient, No Pcp Per  No chief complaint on file.   HPI: Patient is a 76 y.o male who presents today for reevaluation of bilateral feet. Complains of pain to bilateral balls of feet with ambulation as well some rolling out of his right foot with ambulation. States if he puts something in his shoe under the outside of his foot this will make his stand up straight and walk better. Has used over the counter orthotics with no relief. Is in crocs today with a heel pad on right and an orthotic on left.   Had previous heel cord contracture with second metatarsalgia. He was pending gastrocnemius recession and PIP resection for fixed clawing of 2nd toe and Weil osteotomy of the 2nd metatarsal about 6 months ago. Patient ended up cancelling as his pain subsided. He is taking meloxicam for his back pain.    Assessment & Plan: Visit Diagnoses:  1. Achilles tendon contracture, bilateral   2. Claw toe, acquired, left   3. Pain in metatarsus of both feet     Plan: Recommended heel cord stretching for the Achilles contracture. Recommended he have custom orthotics made, may benefit from lateral posting on right. Will refer to [facility].  Follow-Up Instructions: Return if symptoms worsen or fail to improve.   Ortho Exam On examination patient is alert oriented no adenopathy well-dressed normal affect normal respiratory effort he does have an antalgic gait. he is a good dorsalis pedis pulse bilaterally. Edema to bilateral lower extremities with compression garments in place. He has dorsiflexion to neutral with heel cord tightness. Pain is reproduced with palpation beneath the second metatarsal head left foot. He has fixed clawing of the second toe with callus over the PIP joint  left foot second toe. There is dislocation of the MTP joint second toe left foot. There is no redness no cellulitis no ulceration. Right ankle ligaments non tender. Ankle is stable. No swelling.  Imaging: No results found.  Orders:  Orders Placed This Encounter  Procedures  . Ambulatory referral to Podiatry   No orders of the defined types were placed in this encounter.    Procedures: No procedures performed  Clinical Data: No additional findings.  Subjective: Review of Systems  Constitutional: Negative for chills and fever.  Cardiovascular: Positive for leg swelling.  Musculoskeletal: Positive for arthralgias and gait problem.  Neurological: Negative for weakness and numbness.    Objective: Vital Signs: There were no vitals taken for this visit.  Specialty Comments:  No specialty comments available.  PMFS History: Patient Active Problem List   Diagnosis Date Noted  . Spinal stenosis of lumbar region with neurogenic claudication 01/15/2017  . Spondylolisthesis of lumbar region 01/15/2017  . Claw toe, acquired, left 01/06/2017  . Achilles tendon contracture, bilateral 01/06/2017  . Pain in metatarsus of both feet 01/06/2017  . Tubular adenoma of colon 12/29/2016  . Basal cell carcinoma 08/19/2016  . Umbilical hernia s/p lap repair with mesh 06/27/2016 06/27/2016  . Degenerative arthritis of knee 03/22/2015  . Dyspnea on exertion 01/31/2015  . Lumbar radiculopathy 12/12/2014  . Facet hypertrophy of lumbar region 12/12/2014  .  Left knee pain 12/12/2014  . Ulcer of lower limb ([place]) 01/10/2014  . Varicose veins of lower extremities with other complications 36/14/4315  . 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 Renal insufficiency 12/21/2013  . Swelling in head/neck 07/28/2012  . Venous (peripheral) insufficiency 05/25/2012  . 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 Pain in limb 02/12/2012  . Hyperlipidemia 02/28/2008  . Allergic  rhinitis 02/28/2008  . Sleep apnea 02/28/2008   Past Medical History:  Diagnosis Date  . Allergy    takes Zyrtec daily  . Anxiety    takes Ativan daily as needed  . Arthritis   . Back pain   . BPH (benign prostatic hyperplasia)    takes doxazosin for  . 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 Depression    takes Wellbutrin daily  . Dyspnea on exertion    with exertion  . GERD (gastroesophageal reflux disease)    takes Nexium and Omeprazole daily  . History of kidney stones   . Hx of [facility] spotted fever childhood  . Hyperlipidemia   . Neuromuscular disorder ([place])   . Sleep apnea   . Swelling of lower extremity    right more than leg leg  . Umbilical hernia     Family History  Problem Relation Age of Onset  . Thyroid disease Mother   . Heart disease Father   . Diabetes Maternal Grandfather     Past Surgical History:  Procedure Laterality Date  . 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 BACK SURGERY  2003   L4, L5  . BACK SURGERY    . ENDOVENOUS ABLATION SAPHENOUS VEIN W/ LASER Right 06-01-2014   EVLA right greater saphenous vein by Curt Jews MD   . epidural steroid injections        [facility]  . hernia repair  2003  . HERNIA REPAIR     hernia inguinal x 2  . TOTAL KNEE ARTHROPLASTY Left 03/22/2015   Procedure: TOTAL KNEE ARTHROPLASTY;  Surgeon: Meredith Pel, MD;  Location: [place];  Service: Orthopedics;  Laterality: Left;  . UMBILICAL HERNIA REPAIR N/A 06/27/2016   Procedure: LAPAROSCOPIC ASSISTED REPAIR OF UMBILICAL HERINA WITH MESH;  Surgeon: Johnathan Hausen, MD;  Location: [facility];  Service: General;  Laterality: N/A;  . VASCULAR SURGERY  2015   right leg   Social History   Occupational History  . [employer]/Real Estate Retired   Social History Main Topics  . Smoking status: Never Smoker  . Smokeless tobacco: Never Used  . Alcohol use No  . Drug use: No  . Sexual activity: Yes    Birth control/  protection: Condom

## 2017-07-16 DIAGNOSIS — I8312 Varicose veins of left lower extremity with inflammation: Secondary | ICD-10-CM | POA: Diagnosis not present

## 2017-07-16 DIAGNOSIS — I8311 Varicose veins of right lower extremity with inflammation: Secondary | ICD-10-CM | POA: Diagnosis not present

## 2017-07-16 DIAGNOSIS — I83893 Varicose veins of bilateral lower extremities with other complications: Secondary | ICD-10-CM | POA: Diagnosis not present

## 2017-07-17 ENCOUNTER — Telehealth (INDEPENDENT_AMBULATORY_CARE_PROVIDER_SITE_OTHER): Payer: Self-pay | Admitting: Physical Medicine and Rehabilitation

## 2017-07-17 DIAGNOSIS — M4807 Spinal stenosis, lumbosacral region: Secondary | ICD-10-CM

## 2017-07-17 DIAGNOSIS — M4316 Spondylolisthesis, lumbar region: Secondary | ICD-10-CM

## 2017-07-17 NOTE — Telephone Encounter (Signed)
Order placed and patient notified 

## 2017-07-17 NOTE — Telephone Encounter (Signed)
Time to repeat MRI lspine, last was 2014 and injections looked "perfect"

## 2017-07-20 DIAGNOSIS — H2511 Age-related nuclear cataract, right eye: Secondary | ICD-10-CM | POA: Diagnosis not present

## 2017-07-21 ENCOUNTER — Telehealth (INDEPENDENT_AMBULATORY_CARE_PROVIDER_SITE_OTHER): Payer: Self-pay | Admitting: Radiology

## 2017-07-21 DIAGNOSIS — E291 Testicular hypofunction: Secondary | ICD-10-CM | POA: Diagnosis not present

## 2017-07-21 NOTE — Telephone Encounter (Signed)
Patient came in states that he just got a ticket for his handicap placard being expired and that he needed to get a new form to be able to get one.  I had Dr. Sharol Given to complete this form and it was given to the patient to take to the [facility] to get a new tag.

## 2017-07-22 DIAGNOSIS — H2511 Age-related nuclear cataract, right eye: Secondary | ICD-10-CM | POA: Diagnosis not present

## 2017-07-22 DIAGNOSIS — H25811 Combined forms of age-related cataract, right eye: Secondary | ICD-10-CM | POA: Diagnosis not present

## 2017-07-23 ENCOUNTER — Encounter: Payer: Medicare Other | Admitting: Vascular Surgery

## 2017-07-23 ENCOUNTER — Encounter (HOSPITAL_COMMUNITY): Payer: Medicare Other

## 2017-07-24 ENCOUNTER — Encounter: Payer: Self-pay | Admitting: Podiatry

## 2017-07-24 ENCOUNTER — Ambulatory Visit (INDEPENDENT_AMBULATORY_CARE_PROVIDER_SITE_OTHER): Payer: Medicare Other

## 2017-07-24 ENCOUNTER — Ambulatory Visit (INDEPENDENT_AMBULATORY_CARE_PROVIDER_SITE_OTHER): Payer: Medicare Other | Admitting: Podiatry

## 2017-07-24 ENCOUNTER — Other Ambulatory Visit: Payer: Self-pay | Admitting: Podiatry

## 2017-07-24 VITALS — BP 162/89 | HR 60

## 2017-07-24 DIAGNOSIS — M2041 Other hammer toe(s) (acquired), right foot: Secondary | ICD-10-CM | POA: Diagnosis not present

## 2017-07-24 DIAGNOSIS — M2042 Other hammer toe(s) (acquired), left foot: Secondary | ICD-10-CM

## 2017-07-24 DIAGNOSIS — R52 Pain, unspecified: Secondary | ICD-10-CM | POA: Diagnosis not present

## 2017-07-24 DIAGNOSIS — M2142 Flat foot [pes planus] (acquired), left foot: Secondary | ICD-10-CM | POA: Diagnosis not present

## 2017-07-24 NOTE — Progress Notes (Signed)
Subjective:    Patient ID: [name], male    DOB: 23-Jun-1941, 76 y.o.   MRN: [id number]  HPI this patient presents the office with chief complaint of pain in both feet.  He states that his is severe hammer toe on the left foot that is severely contracted.  He says there is pain that is present behind this hammer toe on his left foot that causes extreme pain and discomfort up his foot  during the course of the day.  He also says that he has venous problems which also leads to swelling by 6 PM at night.  He also says he has a painful second toe on the right foot, which is also a hammertoe.  He says that he has balance problems, especially on his right foot, which causes him to pressure on the outside of his right foot and this shoe on the right foot breaks down shoes tread.  He was previously evaluated by [facility] about 2 weeks ago.  He was diagnosed with a claw toe, second left and surgery was recommended for the correction of this hammertoe position.  He was also diagnosed with tight Achilles tendons, which dorsiflex to the perpendicular.  He was given achilles tendon stretches.  He was referred to this office so he can be evaluated by a Liliane Channel. He presents the office to discuss the correction of the second toe of the left foot surgically. He also remarks that he frequently loses his balance because of his right foot.    Review of Systems  Constitutional: Positive for activity change, fatigue and unexpected weight change.  HENT:       Sinus problems  Eyes: Positive for pain, itching and visual disturbance.  Respiratory: Positive for shortness of breath.        Difficult breathing  Endocrine: Positive for heat intolerance and polyuria.  Genitourinary: Positive for frequency and urgency.  Musculoskeletal: Positive for back pain and gait problem.       Muscle pain  Neurological: Positive for weakness and light-headedness.  Hematological: Bruises/bleeds easily.       Slow to heal         Objective:   Physical Exam GENERAL APPEARANCE: Alert, conversant. Appropriately groomed. No acute distress.  VASCULAR: Pedal pulses are  palpable at  [facility] and PT bilateral.  Capillary refill time is immediate to all digits,  Normal temperature gradient.  Swelling secondary to venous stasis . NEUROLOGIC: sensation is normal to 5.07 monofilament at 5/5 sites bilateral.  Light touch is intact bilateral, Muscle strength normal.   MUSCULOSKELETAL: Muscle power was evaluated and the dorsiflexion on both feet is 1 out of 4.  Weak dorsiflexors.   Bony prominence retrocalcaneally right foot.  Upon weight bearing a normal foot profile is noted on his right foot. Upon weight-bearing there is a flattened foot profile on his left foot.  Severely dorsiflexed second digit left foot.  Mild hammer toe second right foot.   Patient points to the shaft of the second metatarsal left foot as site of pain. No motion IPJ  Right hallux.    DERMATOLOGIC: skin color, texture, and turgor are within normal limits.  No preulcerative lesions or ulcers  are seen, no interdigital maceration noted.  No open lesions present.  Digital nails are asymptomatic. No drainage noted.        Assessment & Plan:  Hammer toe second  B/L   Balance problems especially right foot.   IE  X-rays were taken  of both his right and his left foot.  His right foot reveals dorsal arthritic changes in the rear foot. Patient has arthritic changes in the IPJ of the right hallux.  Hammertoe deformity, second digit right foot noted.  Examination of his left foot does reveal a plantar flexexed  Talus.  This leads to his flatfootedness left foot.  Severe second digit hammertoe, left foot.  After my evaluation of his feet. I  discussed the condition with this patient.  I had 2 major impressions after my exam.  I first recommended that he be evaluated and treated by Liliane Channel  to help support both feet and help to limit his balance problems.  If the balance  problems and feet pain are resolved. I do not recommend surgery.  If he still has problems with his feet following evaluation and treatment by Liliane Channel,  I told him at that time, I might consider surgical correction. RTC for appointment with Liliane Channel.   Gardiner Barefoot DPM

## 2017-07-27 ENCOUNTER — Ambulatory Visit: Payer: Medicare Other | Admitting: Orthotics

## 2017-07-27 DIAGNOSIS — R52 Pain, unspecified: Secondary | ICD-10-CM

## 2017-07-27 DIAGNOSIS — M2142 Flat foot [pes planus] (acquired), left foot: Secondary | ICD-10-CM

## 2017-07-27 DIAGNOSIS — M2041 Other hammer toe(s) (acquired), right foot: Secondary | ICD-10-CM

## 2017-07-27 NOTE — Progress Notes (Signed)
[name] wants to "think" about getting f/o since Medicare doesn't cover them.  He thought Medicare or his supplemental policy would.   He will let us know how to procede.

## 2017-07-29 DIAGNOSIS — I83891 Varicose veins of right lower extremities with other complications: Secondary | ICD-10-CM | POA: Diagnosis not present

## 2017-07-29 DIAGNOSIS — I8311 Varicose veins of right lower extremity with inflammation: Secondary | ICD-10-CM | POA: Diagnosis not present

## 2017-08-05 ENCOUNTER — Telehealth (INDEPENDENT_AMBULATORY_CARE_PROVIDER_SITE_OTHER): Payer: Self-pay | Admitting: Physical Medicine and Rehabilitation

## 2017-08-07 ENCOUNTER — Ambulatory Visit
Admission: RE | Admit: 2017-08-07 | Discharge: 2017-08-07 | Disposition: A | Discharge status: Home / Self Care | Payer: Medicare Other | Source: Ambulatory Visit | Attending: Physical Medicine and Rehabilitation | Admitting: Physical Medicine and Rehabilitation

## 2017-08-07 DIAGNOSIS — M4316 Spondylolisthesis, lumbar region: Secondary | ICD-10-CM

## 2017-08-07 DIAGNOSIS — M4807 Spinal stenosis, lumbosacral region: Secondary | ICD-10-CM | POA: Diagnosis not present

## 2017-08-10 NOTE — Telephone Encounter (Signed)
Scheduled for 08/26/17.

## 2017-08-12 DIAGNOSIS — R3915 Urgency of urination: Secondary | ICD-10-CM | POA: Diagnosis not present

## 2017-08-13 DIAGNOSIS — R3915 Urgency of urination: Secondary | ICD-10-CM | POA: Diagnosis not present

## 2017-08-18 ENCOUNTER — Encounter: Payer: Medicare Other | Admitting: Vascular Surgery

## 2017-08-18 ENCOUNTER — Encounter (HOSPITAL_COMMUNITY): Payer: Medicare Other

## 2017-08-19 ENCOUNTER — Ambulatory Visit (INDEPENDENT_AMBULATORY_CARE_PROVIDER_SITE_OTHER): Payer: Medicare Other | Admitting: Physical Medicine and Rehabilitation

## 2017-08-19 ENCOUNTER — Encounter (INDEPENDENT_AMBULATORY_CARE_PROVIDER_SITE_OTHER): Payer: Self-pay | Admitting: Physical Medicine and Rehabilitation

## 2017-08-19 VITALS — BP 125/61 | HR 56

## 2017-08-19 DIAGNOSIS — Z0000 Encounter for general adult medical examination without abnormal findings: Secondary | ICD-10-CM | POA: Diagnosis not present

## 2017-08-19 DIAGNOSIS — R7303 Prediabetes: Secondary | ICD-10-CM | POA: Diagnosis not present

## 2017-08-19 DIAGNOSIS — F331 Major depressive disorder, recurrent, moderate: Secondary | ICD-10-CM | POA: Diagnosis not present

## 2017-08-19 DIAGNOSIS — R3915 Urgency of urination: Secondary | ICD-10-CM | POA: Diagnosis not present

## 2017-08-19 DIAGNOSIS — N183 Chronic kidney disease, stage 3 (moderate): Secondary | ICD-10-CM | POA: Diagnosis not present

## 2017-08-19 DIAGNOSIS — S80811A Abrasion, right lower leg, initial encounter: Secondary | ICD-10-CM | POA: Diagnosis not present

## 2017-08-19 DIAGNOSIS — F411 Generalized anxiety disorder: Secondary | ICD-10-CM | POA: Diagnosis not present

## 2017-08-19 DIAGNOSIS — Z23 Encounter for immunization: Secondary | ICD-10-CM | POA: Diagnosis not present

## 2017-08-19 DIAGNOSIS — B3789 Other sites of candidiasis: Secondary | ICD-10-CM | POA: Diagnosis not present

## 2017-08-19 DIAGNOSIS — G4733 Obstructive sleep apnea (adult) (pediatric): Secondary | ICD-10-CM | POA: Diagnosis not present

## 2017-08-19 DIAGNOSIS — M4316 Spondylolisthesis, lumbar region: Secondary | ICD-10-CM | POA: Diagnosis not present

## 2017-08-19 DIAGNOSIS — M4807 Spinal stenosis, lumbosacral region: Secondary | ICD-10-CM

## 2017-08-19 DIAGNOSIS — E782 Mixed hyperlipidemia: Secondary | ICD-10-CM | POA: Diagnosis not present

## 2017-08-19 DIAGNOSIS — B3749 Other urogenital candidiasis: Secondary | ICD-10-CM | POA: Diagnosis not present

## 2017-08-19 DIAGNOSIS — N401 Enlarged prostate with lower urinary tract symptoms: Secondary | ICD-10-CM | POA: Diagnosis not present

## 2017-08-19 DIAGNOSIS — N5201 Erectile dysfunction due to arterial insufficiency: Secondary | ICD-10-CM | POA: Diagnosis not present

## 2017-08-19 DIAGNOSIS — M48062 Spinal stenosis, lumbar region with neurogenic claudication: Secondary | ICD-10-CM | POA: Diagnosis not present

## 2017-08-19 DIAGNOSIS — E291 Testicular hypofunction: Secondary | ICD-10-CM | POA: Diagnosis not present

## 2017-08-19 NOTE — Progress Notes (Deleted)
Here for MRI review. Pain has been worse. Thinks "we didn't hit it with the epidural this time."

## 2017-08-20 ENCOUNTER — Telehealth (INDEPENDENT_AMBULATORY_CARE_PROVIDER_SITE_OTHER): Payer: Self-pay | Admitting: Orthopaedic Surgery

## 2017-08-20 ENCOUNTER — Encounter (INDEPENDENT_AMBULATORY_CARE_PROVIDER_SITE_OTHER): Payer: Self-pay | Admitting: Physical Medicine and Rehabilitation

## 2017-08-20 DIAGNOSIS — I8311 Varicose veins of right lower extremity with inflammation: Secondary | ICD-10-CM | POA: Diagnosis not present

## 2017-08-20 DIAGNOSIS — I83891 Varicose veins of right lower extremities with other complications: Secondary | ICD-10-CM | POA: Diagnosis not present

## 2017-08-20 NOTE — Progress Notes (Signed)
WARNER LADUCA - 76 y.o. male MRN [id number]  Date of birth: [birth year]  Office Visit Note: Visit Date: 08/19/2017 PCP: [name], No Pcp Per Referred by: No ref. provider found  Subjective: Chief Complaint  [name] presents with  . Lower Back - Pain  . Right Hip - Pain  . Left Hip - Pain   HPI: Mr. Greenfeld is a 76 year old gentleman that we've seen on a few occasions for his low back and radicular type pain. His brief history is that he had prior lumbar laminectomy for decompression at L4-5 with Dr. Carloyn Manner.  He was treated for neurologic condition which I'm unsure of exactly what he had. During that course he had facet joint blocks and ablation procedures of the facet joints. He was referred to see [place] through Dr. Marlou Sa in our office he treats his knees. We ultimately completed epidural injection with good relief of his symptoms but short-lived. We have completed about 4 epidural injections at L4 from a transforaminal approach. He had a listhesis and quite a bit of facet arthropathy on x-ray. We ultimately decided because he didn't get much relief with the last injection even though reviewing those images it was well placed to obtain MRI of the lumbar spine which she is here today to review. This basically shows significant severe multifactorial stenosis at L4-5 with facet arthropathy and listhesis. He has moderate to severe at L3-4 and moderate at L5. He has facet arthropathy throughout. He has no other real conditions of the lumbar spine than the degenerative disc loss as well as the noted above changes. This does fit with the symptoms. His course is complicated by history of renal insufficiency as well as use of diuretics for swelling. He reports continued weight gain. He reports persistent back pain and feels like the injections didn't do much last time. He was told at one point that he would need a multilevel fusion. He is really reluctant to pursue surgery. His cases, but depression and anxiety.      Review of Systems  Constitutional: Negative for chills, fever, malaise/fatigue and weight loss.       Weight gain  HENT: Negative for hearing loss and sinus pain.   Eyes: Negative for blurred vision, double vision and photophobia.  Respiratory: Negative for cough and shortness of breath.   Cardiovascular: Positive for leg swelling. Negative for chest pain and palpitations.  Gastrointestinal: Negative for abdominal pain, nausea and vomiting.  Genitourinary: Positive for frequency. Negative for flank pain.  Musculoskeletal: Positive for back pain and joint pain. Negative for myalgias.  Skin: Negative for itching and rash.  Neurological: Positive for weakness. Negative for tremors and focal weakness.  Endo/Heme/Allergies: Negative.   Psychiatric/Behavioral: Negative for depression.  All other systems reviewed and are negative.  Otherwise per HPI.  Assessment & Plan: Visit Diagnoses:  1. Spinal stenosis of lumbosacral region   2. Spondylolisthesis of lumbar region   3. Spinal stenosis of lumbar region with neurogenic claudication     Plan: Findings:  Chronic worsening severe low back pain and bilateral hip and buttock pain consistent with facet arthropathy and facet mediated low back pain as well as severe stenosis particularly at L4-5 with consistent findings here as well. MRI cooperates for worsening of these levels. We have also seen him in the past for cervical spine issues and he has the same sort of findings in the cervical spine as well. He has degenerative spine in general with facet arthropathy and stenosis. Prior injection at  L4 from a transforaminal approach at night in him much relief and was well placed. We'll discuss different alternatives including medication management and injection as well as surgical. The next step I think is to repeat the injection one time because he says he did get relief once before with the same injection. I think is worth trying one more time since he  has gotten relief and we'll just see if that helps. Obviously if it helps for many months at a time and is beneficial we could repeat that. We also want to get him seen by Dr. Lorin Mercy for surgical evaluation. I think he would do well with decompression of the L3-4 and L4-5 area but it may require lumbar fusion but he needs to hear this for more the surgeons.  He had an extremely large amount of questions today regarding injections as well as his MRI and potential surgery and uses of his lumbar support brace.Greater than 50% of this visit (total visit time was 40 minutes was spent in counseling and coordination of care discussing the above issues.    Meds & Orders: No orders of the defined types were placed in this encounter.   Orders Placed This Encounter  Procedures  . Ambulatory referral to Orthopedic Surgery    Follow-up: Return for Bilateral L4 transforaminal epidural steroid injection..   Procedures: No procedures performed  No notes on file   Clinical History: Lumbar Spine MRI 2014 L3-4:  A diffuse bulging annulus, short pedicles and facet disease contribute to moderate spinal and bilateral lateral recess stenosis and mild bilateral foraminal stenosis.  This appears relatively stable.  L4-5:  Degenerative anterolisthesis of L4 with a bulging uncovered disc, short pedicles and severe facet disease contributing to moderately severe spinal and bilateral lateral recess stenosis and moderately severe bilateral foraminal stenosis.  These findings relatively stable.  L5-S1:  Persistent focal central disc protrusion and severe facet disease contributing to spinal and lateral recess stenosis with persistent mass effect on the right S1 nerve root.  No significant foraminal stenosis.  IMPRESSION: Severe degenerative lumbar spondylosis with multilevel multifactorial spinal, lateral recess and foraminal stenosis as specifically discussed above at the individual levels.  No significant  interval change when compared the prior study.  Cervical MRI 08/30/2016 IMPRESSION: Mild progressive degenerative changes when compared to prior exam. Summary of pertinent findings includes:  C1-2 degenerative changes with small amount of fluid right C1-2 lateral mass articulation.  C2-3 no significant spinal stenosis or foraminal narrowing.  C3-4 multifactorial mild to moderate foraminal narrowing greater on the left. Vertebral arteries extends into neural foramen. Spur posterior superior aspect of the C4 vertebra greatest left paracentral position. Mild indentation ventral thecal sac and minimal left-sided cord contact.  C4-5 broad-based disc osteophyte complex greater to the right. Spinal stenosis and cord flattening greater on the right. Multifactorial marked bilateral foraminal narrowing greater on the right.  C5-6 broad-based disc osteophyte complex greater to the right. Mild right-sided cord flattening. Multifactorial mild to moderate right foraminal narrowing.  C6-7 broad-based disc osteophyte complex slightly greater to left. Ventral thecal sac narrowing with minimal left-sided cord contact. Multifactorial marked bilateral foraminal narrowing.  C7-T1 bulge and spur with narrowing ventral thecal sac. Moderate bilateral foraminal narrowing.  He reports that he has never smoked. He has never used smokeless tobacco. No results for input(s): HGBA1C, LABURIC in the last 8760 hours.  Objective:  VS:  HT:    WT:   BMI:     BP:125/61  HR:(!) 56bpm  TEMP: ( )  RESP:  Physical Exam  Constitutional: He is oriented to person, place, and time. He appears well-developed and well-nourished. No distress.  HENT:  Head: Normocephalic and atraumatic.  Nose: Nose normal.  Mouth/Throat: Oropharynx is clear and moist.  Eyes: Pupils are equal, round, and reactive to light. Conjunctivae are normal.  Neck: Neck supple. No tracheal deviation present.  Cardiovascular: Regular rhythm and  intact distal pulses.   Pulmonary/Chest: Effort normal and breath sounds normal. No respiratory distress.  Abdominal: Soft. He exhibits distension. There is no tenderness.  Musculoskeletal: He exhibits no deformity.  [name] ambulates without aid. He has some difficulty rising from a seated position and does have pain with extension and rotation of the lumbar spine. He has no pain over the greater trochanters. He has no pain with hip rotation. He has good distal strength without clonus. He does have swelling in both legs with 2+ edema  Lymphadenopathy:    He has no cervical adenopathy.  Neurological: He is alert and oriented to person, place, and time. Coordination normal.  Skin: Skin is warm and dry. No rash noted. No erythema.  Psychiatric: He has a normal mood and affect. His behavior is normal.  Nursing note and vitals reviewed.   Ortho Exam Imaging: No results found.  Past Medical/Family/Surgical/Social History: Medications & Allergies reviewed per EMR [name] Active Problem List   Diagnosis Date Noted  . Spinal stenosis of lumbar region with neurogenic claudication 01/15/2017  . Spondylolisthesis of lumbar region 01/15/2017  . Claw toe, acquired, left 01/06/2017  . Achilles tendon contracture, bilateral 01/06/2017  . Pain in metatarsus of both feet 01/06/2017  . Tubular adenoma of colon 12/29/2016  . Basal cell carcinoma 08/19/2016  . Umbilical hernia s/p lap repair with mesh 06/27/2016 06/27/2016  . Degenerative arthritis of knee 03/22/2015  . Dyspnea on exertion 01/31/2015  . Lumbar radiculopathy 12/12/2014  . Facet hypertrophy of lumbar region 12/12/2014  . Left knee pain 12/12/2014  . Ulcer of lower limb ([place]) 01/10/2014  . Varicose veins of lower extremities with other complications 71/69/6789  . 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 Renal insufficiency 12/21/2013  . Swelling in head/neck 07/28/2012  . Venous (peripheral)  insufficiency 05/25/2012  . 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 Pain in limb 02/12/2012  . Hyperlipidemia 02/28/2008  . Allergic rhinitis 02/28/2008  . Sleep apnea 02/28/2008   Past Medical History:  Diagnosis Date  . Allergy    takes Zyrtec daily  . Anxiety    takes Ativan daily as needed  . Arthritis   . Back pain   . BPH (benign prostatic hyperplasia)    takes doxazosin for  . 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 Depression    takes Wellbutrin daily  . Dyspnea on exertion    with exertion  . GERD (gastroesophageal reflux disease)    takes Nexium and Omeprazole daily  . History of kidney stones   . Hx of [facility] spotted fever childhood  . Hyperlipidemia   . Neuromuscular disorder ([place])   . Sleep apnea   . Swelling of lower extremity    right more than leg leg  . Umbilical hernia    Family History  Problem Relation Age of Onset  . Thyroid disease Mother   . Heart disease Father   . Diabetes Maternal Grandfather    Past Surgical History:  Procedure Laterality Date  . AMPUTATION FINGER  lft hand middle and second fingers  . BACK SURGERY  2003   L4, L5  . BACK SURGERY    . ENDOVENOUS ABLATION SAPHENOUS VEIN W/ LASER Right 06-01-2014   EVLA right greater saphenous vein by Curt Jews MD   . epidural steroid injections        [facility] dr Avarey Yaeger  . hernia repair  2003  . HERNIA REPAIR     hernia inguinal x 2  . TOTAL KNEE ARTHROPLASTY Left 03/22/2015   Procedure: TOTAL KNEE ARTHROPLASTY;  Surgeon: Meredith Pel, MD;  Location: [facility];  Service: Orthopedics;  Laterality: Left;  . UMBILICAL HERNIA REPAIR N/A 06/27/2016   Procedure: LAPAROSCOPIC ASSISTED REPAIR OF UMBILICAL HERINA WITH MESH;  Surgeon: Johnathan Hausen, MD;  Location: [facility];  Service: General;  Laterality: N/A;  . VASCULAR SURGERY  2015   right leg   Social History   Occupational History  . [employer]/Real Estate Retired   Social History Main Topics  . Smoking status:  Never Smoker  . Smokeless tobacco: Never Used  . Alcohol use No  . Drug use: No  . Sexual activity: Yes    Birth control/ protection: Condom

## 2017-08-20 NOTE — Telephone Encounter (Signed)
I contacted the patient to schedule his appointment.  I did not see any time available until the 5th of November.  He wanted to see if there was any other time or days that he can be seen earlier than that day.  CB#[phone].  Thank you.

## 2017-08-24 NOTE — Telephone Encounter (Signed)
I called patient. He is having an ESI with Dr. Ernestina Patches on 09/02/2017. I advised that we would want to see him a couple of weeks after injection to see how it helped. He will keep appt on 11/9.

## 2017-08-26 ENCOUNTER — Ambulatory Visit (INDEPENDENT_AMBULATORY_CARE_PROVIDER_SITE_OTHER): Payer: Medicare Other

## 2017-08-26 ENCOUNTER — Ambulatory Visit (INDEPENDENT_AMBULATORY_CARE_PROVIDER_SITE_OTHER): Payer: Medicare Other | Admitting: Physical Medicine and Rehabilitation

## 2017-08-26 ENCOUNTER — Encounter (INDEPENDENT_AMBULATORY_CARE_PROVIDER_SITE_OTHER): Payer: Self-pay | Admitting: Physical Medicine and Rehabilitation

## 2017-08-26 VITALS — BP 136/71 | HR 66 | Temp 97.7°F

## 2017-08-26 DIAGNOSIS — M4807 Spinal stenosis, lumbosacral region: Secondary | ICD-10-CM

## 2017-08-26 DIAGNOSIS — M5416 Radiculopathy, lumbar region: Secondary | ICD-10-CM

## 2017-08-26 MED ORDER — BETAMETHASONE SOD PHOS & ACET 6 (3-3) MG/ML IJ SUSP
12.0000 mg | Freq: Once | INTRAMUSCULAR | Status: AC
  Administered 2017-08-26: 12 mg

## 2017-08-26 MED ORDER — LIDOCAINE HCL (PF) 1 % IJ SOLN
2.0000 mL | Freq: Once | INTRAMUSCULAR | Status: AC
  Administered 2017-08-26: 2 mL

## 2017-08-26 NOTE — Progress Notes (Deleted)
+  Driver= Cindy, -bt's, - Dye allergy

## 2017-08-26 NOTE — Patient Instructions (Signed)

## 2017-09-02 ENCOUNTER — Encounter (INDEPENDENT_AMBULATORY_CARE_PROVIDER_SITE_OTHER): Payer: Medicare Other | Admitting: Physical Medicine and Rehabilitation

## 2017-09-07 ENCOUNTER — Encounter (INDEPENDENT_AMBULATORY_CARE_PROVIDER_SITE_OTHER): Payer: Self-pay | Admitting: Physical Medicine and Rehabilitation

## 2017-09-07 NOTE — Procedures (Signed)
[name] is a 76 year old gentleman who was recently seen for reevaluation of his lumbar spine and we did update his MRI.  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  He has symptoms consistent with this.  Prior injections have offered some relief.  We did agree to try one more injection.  We did have him follow up with a spine surgeon.  His case is complicated by heart disease.  Lumbosacral Transforaminal Epidural Steroid Injection - Sub-Pedicular Approach with Fluoroscopic Guidance  Patient: [name]      Date of Birth: 1941-05-14 MRN: [id number] PCP: Patient, No Pcp Per      Visit Date: 08/26/2017   Universal Protocol:    Date/Time: 08/26/2017  Consent Given By: the patient  Position: PRONE  Additional Comments: Vital signs were monitored before and after the procedure. Patient was prepped and draped in the usual sterile fashion. The correct patient, procedure, and site was verified.   Injection Procedure Details:  Procedure Site One Meds Administered:  Meds ordered this encounter  Medications  . lidocaine (PF) (XYLOCAINE) 1 % injection 2 mL  . betamethasone acetate-betamethasone sodium phosphate (CELESTONE) injection 12 mg    Laterality: Bilateral  Location/Site:  L4-L5  Needle size: 22 G  Needle type: Spinal  Needle Placement: Transforaminal  Findings:  -Contrast Used: 1 mL iohexol 180 mg iodine/mL   -Comments: Excellent flow of contrast along the nerve and into the epidural space.  Procedure Details: After squaring off the end-plates to get a true AP view, the C-arm was positioned so that an oblique view of the foramen as noted above was visualized. The target area is just inferior to the "nose of the scotty dog" or sub pedicular. The soft tissues overlying this structure were infiltrated with 2-3 ml. of 1% Lidocaine without Epinephrine.  The spinal needle was inserted toward the target using a "trajectory" view along the  fluoroscope beam.  Under AP and lateral visualization, the needle was advanced so it did not puncture dura and was located close the 6 O'Clock position of the pedical in AP tracterory. Biplanar projections were used to confirm position. Aspiration was confirmed to be negative for CSF and/or blood. A 1-2 ml. volume of Isovue-250 was injected and flow of contrast was noted at each level. Radiographs were obtained for documentation purposes.   After attaining the desired flow of contrast documented above, a 0.5 to 1.0 ml test dose of 0.25% Marcaine was injected into each respective transforaminal space.  The patient was observed for 90 seconds post injection.  After no sensory deficits were reported, and normal lower extremity motor function was noted,   the above injectate was administered so that equal amounts of the injectate were placed at each foramen (level) into the transforaminal epidural space.   Additional Comments:  The patient tolerated the procedure well Dressing: Band-Aid    Post-procedure details: Patient was observed during the procedure. Post-procedure instructions were reviewed.  Patient left the clinic in stable condition.

## 2017-09-09 DIAGNOSIS — L97211 Non-pressure chronic ulcer of right calf limited to breakdown of skin: Secondary | ICD-10-CM | POA: Diagnosis not present

## 2017-09-09 DIAGNOSIS — I8311 Varicose veins of right lower extremity with inflammation: Secondary | ICD-10-CM | POA: Diagnosis not present

## 2017-09-11 ENCOUNTER — Ambulatory Visit (INDEPENDENT_AMBULATORY_CARE_PROVIDER_SITE_OTHER): Payer: Medicare Other | Admitting: Orthopaedic Surgery

## 2017-09-11 ENCOUNTER — Encounter (INDEPENDENT_AMBULATORY_CARE_PROVIDER_SITE_OTHER): Payer: Self-pay | Admitting: Orthopaedic Surgery

## 2017-09-11 VITALS — BP 148/81 | HR 72 | Temp 97.4°F | Ht 70.0 in | Wt 251.0 lb

## 2017-09-11 DIAGNOSIS — M4316 Spondylolisthesis, lumbar region: Secondary | ICD-10-CM | POA: Diagnosis not present

## 2017-09-11 DIAGNOSIS — M48062 Spinal stenosis, lumbar region with neurogenic claudication: Secondary | ICD-10-CM

## 2017-09-16 ENCOUNTER — Encounter (INDEPENDENT_AMBULATORY_CARE_PROVIDER_SITE_OTHER): Payer: Self-pay | Admitting: Orthopaedic Surgery

## 2017-09-16 NOTE — Progress Notes (Signed)
Office Visit Note   Patient: [name]           Date of Birth: [birth year]           MRN: [id number] Visit Date: 09/11/2017              Requested by: Magnus Sinning, MD [address] [place], [place] [address] PCP: Patient, No Pcp Per   Assessment & Plan: Visit Diagnoses:  1. Spondylolisthesis of lumbar region   2. Spinal stenosis of lumbar region with neurogenic claudication     Plan: The patient continues to work.  He is interested in getting rid of the pain but does not want anything done that would slow him down from his current work.  He has multilevel changes and would require a multilevel decompression as well as stabilization due to foraminal stenosis and instability at L4-5 and with moderate changes at adjacent levels he likely would need multilevel instrumentation at the time of his decompression.  We will have him see Dr. Louanne Skye to review his most recent MRI scan.  We discussed problems with adjacent level progression if only a limited decompression at one level on would be addressed at the L4-5 level.  Follow-Up Instructions: No Follow-up on file.   Orders:  No orders of the defined types were placed in this encounter.  No orders of the defined types were placed in this encounter.     Procedures: No procedures performed   Clinical Data: No additional findings.   Subjective: Chief Complaint  Patient presents with  . Lower Back - Pain    HPI 76 year old male seen with chronic back pain.  He has had several other spine surgeons see him in the past.  He has had epidurals in the past most recently with Dr.Newton on 08/26/2017.  Previously got relief with epidural sometimes now the injections do not help.  He has burning in his legs he has had recent venous ablation therapy has swelling in his legs and uses Lasix.  He has pain and stiffness in the morning and has increased pain with prolonged ambulation or standing.  Previous flexion-extension lateral  lumbar x-ray showed shifting of 4 mm from 10-14 mm at L4-5 with flexion-extension x-rays.  He has moderate stenosis at L3-4 and L5-S1 with severe stenosis at L4-5.  Moderate stenosis at L2-3 with moderate foraminal stenosis.  Only mild narrowing at L1-2.  Patient has multilevel changes of moderate to severe stenosis with instability at L4-5 and disc degeneration at multiple levels with some lumbar curvature on the left apex at L1 to less than 20 degrees.  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  He states there is no one else who can do what he does.  He has been featured on some TV shows and states with prolonged standing he has more problems.  He gets relief with standing or leaning forward.   Review of Systems review of systems positive for increased BMI, renal insufficiency, sleep apnea, lumbar spondylolisthesis with stenosis, cervical disc degeneration, previous hernia surgery.  Venous insufficiency.  Total knee arthroplasty on the left by Dr. Marlou Sa otherwise negative as it pertains HPI.   Objective: Vital Signs: BP (!) 148/81 (BP Location: Left Arm, Patient Position: Sitting, Cuff Size: Large)   Pulse 72   Temp (!) 97.4 F (36.3 C) (Oral)  Ht 5\' 10"  (1.778 m)   Wt 251 lb (113.9 kg)   BMI 36.01 kg/m   Physical Exam  Constitutional: He is oriented to person, place, and time. He appears well-developed and well-nourished.  HENT:  Head: Normocephalic and atraumatic.  Eyes: EOM are normal. Pupils are equal, round, and reactive to light.  Neck: No tracheal deviation present. No thyromegaly present.  Cardiovascular: Normal rate.  Pulmonary/Chest: Effort normal. He has no wheezes.  Abdominal: Soft. Bowel sounds are normal.  Neurological: He is alert and oriented to person, place, and time.  Skin: Skin is warm and dry. Capillary refill takes less than 2  seconds.  Psychiatric: He has a normal mood and affect. His behavior is normal. Judgment and thought content normal.    Ortho Exam well-healed total knee arthroplasty.  He is able to get from sitting to standing ambulates in the office.  No significant pain with hip range of motion.  Ankle jerk intact. Specialty Comments:  No specialty comments available.  Imaging: CLINICAL DATA:  Low back pain with bilateral sciatica.  EXAM: MRI LUMBAR SPINE WITHOUT CONTRAST  TECHNIQUE: Multiplanar, multisequence MR imaging of the lumbar spine was performed. No intravenous contrast was administered.  COMPARISON:  Lumbar spine MRI 03/07/2017  FINDINGS: Segmentation:  Standard.  Alignment: Grade 1 retrolisthesis at L3-L4. Grade 1 anterolisthesis at L4-L5.  Vertebrae: Superior L2 and inferior L1 endplate abnormalities are unchanged .  Conus medullaris: Extends to the L1 level and appears normal.  Paraspinal and other soft tissues: Negative.  Disc levels:  Above the T12 level, there is no disc herniation, spinal canal stenosis or neuroforaminal stenosis.  T12-L1: Normal disc space and facets. No spinal canal or neuroforaminal stenosis.  L1-L2: Disc space narrowing with posterior disc osteophyte complex, unchanged. Mild spinal canal stenosis. Severe bilateral neural foraminal stenosis, unchanged.  L2-L3: Diffuse disc bulge, worsened from the prior study, with bilateral moderate facet hypertrophy, results in moderate spinal canal stenosis. Moderate right and left neural foraminal stenosis, unchanged.  L3-L4: Worsened, moderate spinal canal stenosis due to combination of disc bulge and facet hypertrophy. Mild right and moderate left neural foraminal stenosis is unchanged.  L4-L5: Grade 1 anterolisthesis with pseudo disc bulge and severe bilateral facet hypertrophy, unchanged. Severe spinal canal stenosis and severe bilateral neural foraminal stenosis is  unchanged.  L5-S1: Disc space narrowing with small bulge and moderate bilateral facet hypertrophy, unchanged. Moderate spinal canal stenosis and bilateral neural foraminal stenosis, unchanged.  Visualized sacrum: Normal.  IMPRESSION: 1. Worsening of degenerative disc disease at L2-L3 and L3-L4 with moderate spinal canal stenosis at both levels and moderate bilateral L2 and left L3 neural foraminal stenosis. 2. Severe spinal canal stenosis and bilateral neural foraminal stenosis at L4-L5 with grade 1 anterolisthesis and severe facet arthrosis, unchanged. 3. Unchanged moderate spinal canal stenosis at L5-S1 with moderate bilateral neural foraminal stenosis and moderate facet arthrosis. 4. Unchanged severe bilateral L1 neural foraminal stenosis.   Electronically Signed   By: Ulyses Jarred M.D.   On: 08/07/2017 20:23   PMFS History: Patient Active Problem List   Diagnosis Date Noted  . Spinal stenosis of lumbar region with neurogenic claudication 01/15/2017  . Spondylolisthesis of lumbar region 01/15/2017  . Claw toe, acquired, left 01/06/2017  . Achilles tendon contracture, bilateral 01/06/2017  . Pain in metatarsus of both feet 01/06/2017  . Tubular adenoma of colon 12/29/2016  . Basal cell carcinoma 08/19/2016  . Umbilical hernia s/p lap repair with mesh 06/27/2016 06/27/2016  . Degenerative arthritis  of knee 03/22/2015  . Dyspnea on exertion 01/31/2015  . Lumbar radiculopathy 12/12/2014  . Facet hypertrophy of lumbar region 12/12/2014  . Left knee pain 12/12/2014  . Ulcer of lower limb ([place]) 01/10/2014  . Varicose veins of lower extremities with other complications 58/52/7782  . 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 Renal insufficiency 12/21/2013  . Swelling in head/neck 07/28/2012  . Venous (peripheral) insufficiency 05/25/2012  . 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 Pain in limb 02/12/2012  . Hyperlipidemia 02/28/2008  .  Allergic rhinitis 02/28/2008  . Sleep apnea 02/28/2008   Past Medical History:  Diagnosis Date  . Allergy    takes Zyrtec daily  . Anxiety    takes Ativan daily as needed  . Arthritis   . Back pain   . BPH (benign prostatic hyperplasia)    takes doxazosin for  . 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 Depression    takes Wellbutrin daily  . Dyspnea on exertion    with exertion  . GERD (gastroesophageal reflux disease)    takes Nexium and Omeprazole daily  . History of kidney stones   . Hx of [facility] spotted fever childhood  . Hyperlipidemia   . Neuromuscular disorder ([place])   . Sleep apnea   . Swelling of lower extremity    right more than leg leg  . Umbilical hernia     Family History  Problem Relation Age of Onset  . Thyroid disease Mother   . Heart disease Father   . Diabetes Maternal Grandfather     Past Surgical History:  Procedure Laterality Date  . 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 BACK SURGERY  2003   L4, L5  . BACK SURGERY    . ENDOVENOUS ABLATION SAPHENOUS VEIN W/ LASER Right 06-01-2014   EVLA right greater saphenous vein by Curt Jews MD   . epidural steroid injections        [facility] dr [facility]  . hernia repair  2003  . HERNIA REPAIR     hernia inguinal x 2  . VASCULAR SURGERY  2015   right leg   Social History   Occupational History  . 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 Smoking status: Never Smoker  . Smokeless tobacco: Never Used  Substance and Sexual Activity  . Alcohol use: No  . Drug use: No  . Sexual activity: Yes    Birth control/protection: Condom

## 2017-09-18 ENCOUNTER — Ambulatory Visit (INDEPENDENT_AMBULATORY_CARE_PROVIDER_SITE_OTHER): Payer: Medicare Other | Admitting: Orthopaedic Surgery

## 2017-09-25 DIAGNOSIS — G4733 Obstructive sleep apnea (adult) (pediatric): Secondary | ICD-10-CM | POA: Diagnosis not present

## 2017-09-25 DIAGNOSIS — R5383 Other fatigue: Secondary | ICD-10-CM | POA: Diagnosis not present

## 2017-09-25 DIAGNOSIS — F331 Major depressive disorder, recurrent, moderate: Secondary | ICD-10-CM | POA: Diagnosis not present

## 2017-09-25 DIAGNOSIS — I8312 Varicose veins of left lower extremity with inflammation: Secondary | ICD-10-CM | POA: Diagnosis not present

## 2017-09-25 DIAGNOSIS — F411 Generalized anxiety disorder: Secondary | ICD-10-CM | POA: Diagnosis not present

## 2017-09-25 DIAGNOSIS — Z0000 Encounter for general adult medical examination without abnormal findings: Secondary | ICD-10-CM | POA: Diagnosis not present

## 2017-09-25 DIAGNOSIS — I83892 Varicose veins of left lower extremities with other complications: Secondary | ICD-10-CM | POA: Diagnosis not present

## 2017-09-25 DIAGNOSIS — N183 Chronic kidney disease, stage 3 (moderate): Secondary | ICD-10-CM | POA: Diagnosis not present

## 2017-09-25 DIAGNOSIS — R7303 Prediabetes: Secondary | ICD-10-CM | POA: Diagnosis not present

## 2017-09-25 DIAGNOSIS — E291 Testicular hypofunction: Secondary | ICD-10-CM | POA: Diagnosis not present

## 2017-09-25 DIAGNOSIS — Z23 Encounter for immunization: Secondary | ICD-10-CM | POA: Diagnosis not present

## 2017-09-25 DIAGNOSIS — E782 Mixed hyperlipidemia: Secondary | ICD-10-CM | POA: Diagnosis not present

## 2017-09-25 DIAGNOSIS — B3789 Other sites of candidiasis: Secondary | ICD-10-CM | POA: Diagnosis not present

## 2017-09-28 DIAGNOSIS — I8312 Varicose veins of left lower extremity with inflammation: Secondary | ICD-10-CM | POA: Diagnosis not present

## 2017-09-29 ENCOUNTER — Ambulatory Visit (INDEPENDENT_AMBULATORY_CARE_PROVIDER_SITE_OTHER): Payer: Medicare Other | Admitting: Orthopedic Surgery

## 2017-09-29 ENCOUNTER — Encounter (INDEPENDENT_AMBULATORY_CARE_PROVIDER_SITE_OTHER): Payer: Self-pay | Admitting: Orthopedic Surgery

## 2017-09-29 DIAGNOSIS — M6702 Short Achilles tendon (acquired), left ankle: Secondary | ICD-10-CM | POA: Diagnosis not present

## 2017-09-29 DIAGNOSIS — M205X2 Other deformities of toe(s) (acquired), left foot: Secondary | ICD-10-CM | POA: Diagnosis not present

## 2017-09-29 NOTE — Progress Notes (Signed)
Office Visit Note   Patient: [name]           Date of Birth: Jul 21, 1941           MRN: [id number] Visit Date: 09/29/2017              Requested by: No referring provider defined for this encounter. PCP: Patient, No Pcp Per  Chief Complaint  Patient presents with  . Right Foot - Follow-up  . Left Foot - Follow-up      HPI: Is a 76 year old gentleman who presents complaining of pain across his midfoot on the left.  He states he can hardly walk due to pain he has had a history of Achilles tendon contracture he has had metatarsalgia he has clawing of the second and third toes.  Patient states that he has tried orthotics and has tried shoewear without relief.    He is status post vein ablation and status post a using a thigh-high compression stocking for his vascular insufficiency.  Assessment & Plan: Visit Diagnoses:  1. Achilles tendon contracture, left   2. Claw toe, left     Plan: Will have the patient get a stiff soled pair of shoes.  He states that with his back he has difficulty getting shoes on discussed the importance of a stiff soled shoe to unload the Lisfranc joint.  Recommended sole orthotics to further support the arch and unload the Lisfranc joint.  Patient was given instruction and demonstrated heel cord stretching to be 5 times a day a minute at a time.  Discussed that if he fails conservative therapy he should follow-up we would obtain 3 view radiographs of the left foot and consider gastrocnemius recession as well as internal fixation across the Lisfranc joint.  Follow-Up Instructions: Return if symptoms worsen or fail to improve.   Ortho Exam  Patient is alert, oriented, no adenopathy, well-dressed, normal affect, normal respiratory effort. Examination patient has a good dorsalis pedis pulse he is wearing compression stockings he does have venous insufficiency.  Patient is point tender to palpation across the Lisfranc complex.  The metatarsal heads are  nontender to palpation he has fixed clawing of the lesser toes.  Patient has significant heel cord contracture with dorsiflexion short of neutral.  Imaging: No results found. No images are attached to the encounter.  Labs: Lab Results  Component Value Date   HGBA1C 5.8 03/11/2016   HGBA1C 5.5 11/10/2015   HGBA1C 5.7 (H) 08/27/2015   ESRSEDRATE 7 11/10/2015   LABURIC 7.3 11/10/2015   REPTSTATUS 03/23/2015 FINAL 03/21/2015   GRAMSTAIN No WBC Seen 03/13/2014   GRAMSTAIN No Squamous Epithelial Cells Seen 03/13/2014   GRAMSTAIN Rare Gram Positive Cocci In Pairs 03/13/2014   CULT  03/21/2015    INSIGNIFICANT GROWTH Performed at [place] 04/05/2015    @LABSALLVALUES (HGBA1)@  @BMI1 @  Orders:  No orders of the defined types were placed in this encounter.  No orders of the defined types were placed in this encounter.    Procedures: No procedures performed  Clinical Data: No additional findings.  ROS:  All other systems negative, except as noted in the HPI. Review of Systems  Objective: Vital Signs: There were no vitals taken for this visit.  Specialty Comments:  No specialty comments available.  PMFS History: Patient Active Problem List   Diagnosis Date Noted  . Spinal stenosis of lumbar region with neurogenic claudication 01/15/2017  . Spondylolisthesis of lumbar region 01/15/2017  .  Claw toe, acquired, left 01/06/2017  . Achilles tendon contracture, bilateral 01/06/2017  . Pain in metatarsus of both feet 01/06/2017  . Tubular adenoma of colon 12/29/2016  . Basal cell carcinoma 08/19/2016  . Umbilical hernia s/p lap repair with mesh 06/27/2016 06/27/2016  . Degenerative arthritis of knee 03/22/2015  . Dyspnea on exertion 01/31/2015  . Lumbar radiculopathy 12/12/2014  . Facet hypertrophy of lumbar region 12/12/2014  . Left knee pain 12/12/2014  . Ulcer of lower limb ([place]) 01/10/2014  . Varicose veins of lower extremities with  other complications 63/89/3734  . 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 Renal insufficiency 12/21/2013  . Swelling in head/neck 07/28/2012  . Venous (peripheral) insufficiency 05/25/2012  . 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 Pain in limb 02/12/2012  . Hyperlipidemia 02/28/2008  . Allergic rhinitis 02/28/2008  . Sleep apnea 02/28/2008   Past Medical History:  Diagnosis Date  . Allergy    takes Zyrtec daily  . Anxiety    takes Ativan daily as needed  . Arthritis   . Back pain   . BPH (benign prostatic hyperplasia)    takes doxazosin for  . 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 Depression    takes Wellbutrin daily  . Dyspnea on exertion    with exertion  . GERD (gastroesophageal reflux disease)    takes Nexium and Omeprazole daily  . History of kidney stones   . Hx of [facility] spotted fever childhood  . Hyperlipidemia   . Neuromuscular disorder ([place])   . Sleep apnea   . Swelling of lower extremity    right more than leg leg  . Umbilical hernia     Family History  Problem Relation Age of Onset  . Thyroid disease Mother   . Heart disease Father   . Diabetes Maternal Grandfather     Past Surgical History:  Procedure Laterality Date  . 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 BACK SURGERY  2003   L4, L5  . BACK SURGERY    . ENDOVENOUS ABLATION SAPHENOUS VEIN W/ LASER Right 06-01-2014   EVLA right greater saphenous vein by Curt Jews MD   . epidural steroid injections        [facility]  . hernia repair  2003  . HERNIA REPAIR     hernia inguinal x 2  . TOTAL KNEE ARTHROPLASTY Left 03/22/2015   Procedure: TOTAL KNEE ARTHROPLASTY;  Surgeon: Meredith Pel, MD;  Location: [place];  Service: Orthopedics;  Laterality: Left;  . UMBILICAL HERNIA REPAIR N/A 06/27/2016   Procedure: LAPAROSCOPIC ASSISTED REPAIR OF UMBILICAL HERINA WITH MESH;  Surgeon: Johnathan Hausen, MD;  Location: [facility];  Service: General;  Laterality: N/A;  . VASCULAR SURGERY  2015   right leg   Social History   Occupational History  . 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 Smoking status: Never Smoker  . Smokeless tobacco: Never Used  Substance and Sexual Activity  . Alcohol use: No  . Drug use: No  . Sexual activity: Yes    Birth control/protection: Condom

## 2017-09-30 ENCOUNTER — Ambulatory Visit (INDEPENDENT_AMBULATORY_CARE_PROVIDER_SITE_OTHER): Payer: Medicare Other | Admitting: Specialist

## 2017-09-30 ENCOUNTER — Encounter (INDEPENDENT_AMBULATORY_CARE_PROVIDER_SITE_OTHER): Payer: Self-pay | Admitting: Specialist

## 2017-09-30 VITALS — BP 129/67 | HR 74 | Ht 70.0 in | Wt 251.0 lb

## 2017-09-30 DIAGNOSIS — M4726 Other spondylosis with radiculopathy, lumbar region: Secondary | ICD-10-CM | POA: Diagnosis not present

## 2017-09-30 DIAGNOSIS — M48062 Spinal stenosis, lumbar region with neurogenic claudication: Secondary | ICD-10-CM

## 2017-09-30 DIAGNOSIS — M5136 Other intervertebral disc degeneration, lumbar region: Secondary | ICD-10-CM

## 2017-09-30 MED ORDER — TRAMADOL-ACETAMINOPHEN 37.5-325 MG PO TABS: 1.0000 | ORAL_TABLET | Freq: Four times a day (QID) | ORAL | 0 refills | Status: DC | PRN

## 2017-09-30 NOTE — Patient Instructions (Addendum)
Avoid bending, stooping and avoid lifting weights greater than 10 lbs. Avoid prolong standing and walking. Avoid frequent bending and stooping  No lifting greater than 10 lbs. May use ice or moist heat for pain. Weight loss is of benefit. Handicap license is approved.  

## 2017-09-30 NOTE — Progress Notes (Signed)
Office Visit Note   Patient: [name]           Date of Birth: [birth year]           MRN: [id number] Visit Date: 09/30/2017              Requested by: No referring provider defined for this encounter. PCP: Patient, No Pcp Per   Assessment & Plan: Visit Diagnoses:  1. Spinal stenosis of lumbar region with neurogenic claudication   2. Degenerative disc disease, lumbar   3. Other spondylosis with radiculopathy, lumbar region     Plan:Avoid bending, stooping and avoid lifting weights greater than 10 lbs. Avoid prolong standing and walking. Avoid frequent bending and stooping  No lifting greater than 10 lbs. May use ice or moist heat for pain. Weight loss is of benefit. Handicap license is approved.    Follow-Up Instructions: Return in about 3 weeks (around 10/21/2017).   Orders:  No orders of the defined types were placed in this encounter.  No orders of the defined types were placed in this encounter.     Procedures: No procedures performed   Clinical Data: No additional findings.   Subjective: Chief Complaint  Patient presents with  . Lower Back - Follow-up    76 year old male with history of back and leg pain, he has been followed by Dr. Ernestina Patches and Dr. Lorin Mercy and he was referred for evaluation of consideration of lumbar fusion or decompression. He has a previous history of evaluation with Dr. Glenna Fellows and he is trying to lose weight but is unsuccessful with getting weight off. 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 He is able to walk with a buggy, has a venous concern in both legs, on lasix and has had venous ablation on the left side recently this week. The right leg was ablated a year ago. He feels as though the leg continue to swell by the end of the day. He uses thigh high compression stockings on the left side. He has back pain more than leg pain. Pain with bending and  walking and stooping and lifting is painful. Riding in a car is not as bad, post 50-60 miles he has to void. No bowel or bladder difficulty, He has frequency of urination but prostate is okay. There is numbness and feelings of tightness in the feet. He feels the pain mainly in his back. Left foot is weak as is the right with inability to stand on the toes. Left TKR replacement but doesn't feel like it improved his status. He wants to know if surgery might help.     Review of Systems  Constitutional: Negative.   HENT: Negative.   Eyes: Negative.   Respiratory: Negative.   Cardiovascular: Negative.   Gastrointestinal: Negative.   Endocrine: Negative.   Genitourinary: Negative.   Musculoskeletal: Negative.   Skin: Negative.   Allergic/Immunologic: Negative.   Neurological: Negative.   Hematological: Negative.   Psychiatric/Behavioral: Negative.      Objective: Vital Signs: BP 129/67 (BP Location: Left Arm, Patient Position: Sitting)   Pulse 74   Ht 5\' 10"  (1.778 m)   Wt 251 lb (113.9 kg)   BMI 36.01 kg/m   Physical Exam  Ortho Exam  Specialty Comments:  No specialty comments available.  Imaging: No results found.   PMFS History: Patient Active Problem List   Diagnosis Date Noted  .  Spinal stenosis of lumbar region with neurogenic claudication 01/15/2017  . Spondylolisthesis of lumbar region 01/15/2017  . Claw toe, acquired, left 01/06/2017  . Achilles tendon contracture, bilateral 01/06/2017  . Pain in metatarsus of both feet 01/06/2017  . Tubular adenoma of colon 12/29/2016  . Basal cell carcinoma 08/19/2016  . Umbilical hernia s/p lap repair with mesh 06/27/2016 06/27/2016  . Degenerative arthritis of knee 03/22/2015  . Dyspnea on exertion 01/31/2015  . Lumbar radiculopathy 12/12/2014  . Facet hypertrophy of lumbar region 12/12/2014  . Left knee pain 12/12/2014  . Ulcer of lower limb ([place]) 01/10/2014  . Varicose veins of lower extremities with other  complications 81/19/1478  . 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 Renal insufficiency 12/21/2013  . Swelling in head/neck 07/28/2012  . Venous (peripheral) insufficiency 05/25/2012  . 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 Pain in limb 02/12/2012  . Hyperlipidemia 02/28/2008  . Allergic rhinitis 02/28/2008  . Sleep apnea 02/28/2008   Past Medical History:  Diagnosis Date  . Allergy    takes Zyrtec daily  . Anxiety    takes Ativan daily as needed  . Arthritis   . Back pain   . BPH (benign prostatic hyperplasia)    takes doxazosin for  . 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 Depression    takes Wellbutrin daily  . Dyspnea on exertion    with exertion  . GERD (gastroesophageal reflux disease)    takes Nexium and Omeprazole daily  . History of kidney stones   . 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 Hyperlipidemia   . Neuromuscular disorder ([place])   . Sleep apnea   . Swelling of lower extremity    right more than leg leg  . Umbilical hernia     Family History  Problem Relation Age of Onset  . Thyroid disease Mother   . Heart disease Father   . Diabetes Maternal Grandfather     Past Surgical History:  Procedure Laterality Date  . 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 BACK SURGERY  2003   L4, L5  . BACK SURGERY    . ENDOVENOUS ABLATION SAPHENOUS VEIN W/ LASER Right 06-01-2014   EVLA right greater saphenous vein by Curt Jews MD   . epidural steroid injections        [facility]  . hernia repair  2003  . HERNIA REPAIR     hernia inguinal x 2  . TOTAL KNEE ARTHROPLASTY Left 03/22/2015   Procedure: TOTAL KNEE ARTHROPLASTY;  Surgeon: Meredith Pel, MD;  Location: [name];  Service: Orthopedics;  Laterality: Left;  . UMBILICAL HERNIA REPAIR N/A 06/27/2016   Procedure: LAPAROSCOPIC ASSISTED REPAIR OF UMBILICAL HERINA WITH MESH;  Surgeon: Johnathan Hausen, MD;  Location: [facility];   Service: General;  Laterality: N/A;  . VASCULAR SURGERY  2015   right leg   Social History   Occupational History  . 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 Smoking status: Never Smoker  . Smokeless tobacco: Never Used  Substance and Sexual Activity  . Alcohol use: No  . Drug use: No  . Sexual activity: Yes    Birth control/protection: Condom

## 2017-10-09 ENCOUNTER — Other Ambulatory Visit (INDEPENDENT_AMBULATORY_CARE_PROVIDER_SITE_OTHER): Payer: Self-pay

## 2017-10-09 ENCOUNTER — Telehealth (INDEPENDENT_AMBULATORY_CARE_PROVIDER_SITE_OTHER): Payer: Self-pay | Admitting: Orthopedic Surgery

## 2017-10-09 DIAGNOSIS — R29898 Other symptoms and signs involving the musculoskeletal system: Secondary | ICD-10-CM

## 2017-10-09 NOTE — Telephone Encounter (Signed)
His PT received wrong information from MD per patient. He refused to go into detail but wanted a returned call back.

## 2017-10-09 NOTE — Telephone Encounter (Signed)
IC s/w patient and he stated that his referral for P.T. Has been delayed. I reviewed and saw notes in chart from 11/28 that needed to be referred to neuro rehab. This referral was corrected. I advised patient done.

## 2017-10-14 DIAGNOSIS — I83892 Varicose veins of left lower extremities with other complications: Secondary | ICD-10-CM | POA: Diagnosis not present

## 2017-10-14 DIAGNOSIS — I83812 Varicose veins of left lower extremities with pain: Secondary | ICD-10-CM | POA: Diagnosis not present

## 2017-10-14 DIAGNOSIS — I8312 Varicose veins of left lower extremity with inflammation: Secondary | ICD-10-CM | POA: Diagnosis not present

## 2017-11-11 DIAGNOSIS — I83892 Varicose veins of left lower extremities with other complications: Secondary | ICD-10-CM | POA: Diagnosis not present

## 2017-11-11 DIAGNOSIS — I8312 Varicose veins of left lower extremity with inflammation: Secondary | ICD-10-CM | POA: Diagnosis not present

## 2017-11-13 DIAGNOSIS — H01025 Squamous blepharitis left lower eyelid: Secondary | ICD-10-CM | POA: Diagnosis not present

## 2017-11-13 DIAGNOSIS — H35373 Puckering of macula, bilateral: Secondary | ICD-10-CM | POA: Diagnosis not present

## 2017-11-13 DIAGNOSIS — H01024 Squamous blepharitis left upper eyelid: Secondary | ICD-10-CM | POA: Diagnosis not present

## 2017-11-13 DIAGNOSIS — H04123 Dry eye syndrome of bilateral lacrimal glands: Secondary | ICD-10-CM | POA: Diagnosis not present

## 2017-11-13 DIAGNOSIS — H01021 Squamous blepharitis right upper eyelid: Secondary | ICD-10-CM | POA: Diagnosis not present

## 2017-11-13 DIAGNOSIS — Z961 Presence of intraocular lens: Secondary | ICD-10-CM | POA: Diagnosis not present

## 2017-11-13 DIAGNOSIS — H01022 Squamous blepharitis right lower eyelid: Secondary | ICD-10-CM | POA: Diagnosis not present

## 2017-11-23 DIAGNOSIS — G4733 Obstructive sleep apnea (adult) (pediatric): Secondary | ICD-10-CM | POA: Diagnosis not present

## 2017-11-30 ENCOUNTER — Other Ambulatory Visit (HOSPITAL_BASED_OUTPATIENT_CLINIC_OR_DEPARTMENT_OTHER): Payer: Self-pay

## 2017-11-30 DIAGNOSIS — G4710 Hypersomnia, unspecified: Secondary | ICD-10-CM

## 2017-11-30 DIAGNOSIS — G4730 Sleep apnea, unspecified: Secondary | ICD-10-CM

## 2017-11-30 DIAGNOSIS — R0683 Snoring: Secondary | ICD-10-CM

## 2017-12-09 ENCOUNTER — Encounter (HOSPITAL_BASED_OUTPATIENT_CLINIC_OR_DEPARTMENT_OTHER): Payer: Medicare Other

## 2018-01-15 ENCOUNTER — Ambulatory Visit (INDEPENDENT_AMBULATORY_CARE_PROVIDER_SITE_OTHER): Payer: Medicare Other | Admitting: Orthopaedic Surgery

## 2018-01-15 ENCOUNTER — Encounter (INDEPENDENT_AMBULATORY_CARE_PROVIDER_SITE_OTHER): Payer: Self-pay | Admitting: Orthopaedic Surgery

## 2018-01-15 ENCOUNTER — Telehealth (INDEPENDENT_AMBULATORY_CARE_PROVIDER_SITE_OTHER): Payer: Self-pay | Admitting: *Deleted

## 2018-01-15 ENCOUNTER — Ambulatory Visit (INDEPENDENT_AMBULATORY_CARE_PROVIDER_SITE_OTHER): Payer: Medicare Other

## 2018-01-15 VITALS — BP 151/77 | HR 50 | Ht 70.0 in | Wt 240.0 lb

## 2018-01-15 DIAGNOSIS — Z791 Long term (current) use of non-steroidal anti-inflammatories (NSAID): Secondary | ICD-10-CM

## 2018-01-15 DIAGNOSIS — M79671 Pain in right foot: Secondary | ICD-10-CM

## 2018-01-15 DIAGNOSIS — S90111A Contusion of right great toe without damage to nail, initial encounter: Secondary | ICD-10-CM | POA: Diagnosis not present

## 2018-01-15 LAB — BASIC METABOLIC PANEL
BUN/Creatinine Ratio: 11 (calc) (ref 6–22)
BUN: 19 mg/dL (ref 7–25)
CO2: 27 mmol/L (ref 20–32)
Calcium: 9 mg/dL (ref 8.6–10.3)
Chloride: 100 mmol/L (ref 98–110)
Creat: 1.72 mg/dL — ABNORMAL HIGH (ref 0.70–1.18)
GLUCOSE: 78 mg/dL (ref 65–99)
Potassium: 4.8 mmol/L (ref 3.5–5.3)
Sodium: 137 mmol/L (ref 135–146)

## 2018-01-15 NOTE — Progress Notes (Signed)
Office Visit Note   Patient: [name]           Date of Birth: 1941/03/11           MRN: [id number] Visit Date: 01/15/2018              Requested by: No referring provider defined for this encounter. PCP: Patient, No Pcp Per   Assessment & Plan: Visit Diagnoses:  1. Pain in right foot     Plan: He will elevate his foot is much as he can he has no evidence of infection negative for fracture considerable arthritic changes in both feet and I would recommend rechecking a uric acid.  He may have some gout in addition to the recent trauma.  I will call him with the lab results.  Follow-Up Instructions: No Follow-up on file.   Orders:  Orders Placed This Encounter  Procedures  . XR Foot Complete Right   No orders of the defined types were placed in this encounter.     Procedures: No procedures performed   Clinical Data: No additional findings.   Subjective: Chief Complaint  Patient presents with  . Right Great Toe - Pain    HPI 77 year old male seen with right toe pain and swelling after Sunday piece of furniture the 2 people caring got dumped onto his right great toe along the medial aspect of the IP joints getting it.  He is continue to work he always wears teds that are at least 30 compression due to chronic venous stasis problems.  He is worn some leaving the toes out on the right side has had increased pain to the point where he had great trouble walking.  He takes meloxicam at night he has had one episode of elevated uric acid 7.3 in 2016.  His creatinines have been mildly elevated at the 1.5-1.6 range.  He has had problems with his left foot with midfoot foot collapse and degenerative changes at the midfoot.  Shoes with arch buildup have been recommended by Dr. due to.  He has some clawing deformity of his toes on the left particularly second toe.  Achilles tendon contracture.  Review of Systems updated unchanged from last office visit 14 point  updated.   Objective: Vital Signs: BP (!) 151/77   Pulse (!) 50   Ht 5\' 10"  (1.778 m)   Wt 240 lb (108.9 kg)   BMI 34.44 kg/m   Physical Exam  Constitutional: He is oriented to person, place, and time. He appears well-developed and well-nourished.  HENT:  Head: Normocephalic and atraumatic.  Eyes: EOM are normal. Pupils are equal, round, and reactive to light.  Neck: No tracheal deviation present. No thyromegaly present.  Cardiovascular: Normal rate.  Pulmonary/Chest: Effort normal. He has no wheezes.  Abdominal: Soft. Bowel sounds are normal.  Neurological: He is alert and oriented to person, place, and time.  Skin: Skin is warm and dry. Capillary refill takes less than 2 seconds.  Psychiatric: He has a normal mood and affect. His behavior is normal. Judgment and thought content normal.    Ortho Exam patient has abrasion and a blister partially unroofed along the medial aspect of the right great toe.  There is slight erythema of the toe no definite cellulitis.  Pulses are palpable.  Chronic bilateral venous stasis discoloration without ulceration over the pretibial areas.  Pulses in the left foot are normal.  Plantar surface of the foot is normal.  Planus deformity left foot with midfoot  collapse.  Specialty Comments:  No specialty comments available.  Imaging: No results found.   PMFS History: Patient Active Problem List   Diagnosis Date Noted  . Spinal stenosis of lumbar region with neurogenic claudication 01/15/2017  . Spondylolisthesis of lumbar region 01/15/2017  . Claw toe, acquired, left 01/06/2017  . Achilles tendon contracture, bilateral 01/06/2017  . Pain in metatarsus of both feet 01/06/2017  . Tubular adenoma of colon 12/29/2016  . Basal cell carcinoma 08/19/2016  . Umbilical hernia s/p lap repair with mesh 06/27/2016 06/27/2016  . Degenerative arthritis of knee 03/22/2015  . Dyspnea on exertion 01/31/2015  . Lumbar radiculopathy 12/12/2014  . Facet  hypertrophy of lumbar region 12/12/2014  . Left knee pain 12/12/2014  . Ulcer of lower limb ([place]) 01/10/2014  . Varicose veins of lower extremities with other complications 84/16/6063  . 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 Renal insufficiency 12/21/2013  . Swelling in head/neck 07/28/2012  . Venous (peripheral) insufficiency 05/25/2012  . 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 Pain in limb 02/12/2012  . Hyperlipidemia 02/28/2008  . Allergic rhinitis 02/28/2008  . Sleep apnea 02/28/2008   Past Medical History:  Diagnosis Date  . Allergy    takes Zyrtec daily  . Anxiety    takes Ativan daily as needed  . Arthritis   . Back pain   . BPH (benign prostatic hyperplasia)    takes doxazosin for  . 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 Depression    takes Wellbutrin daily  . Dyspnea on exertion    with exertion  . GERD (gastroesophageal reflux disease)    takes Nexium and Omeprazole daily  . History of kidney stones   . 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 Hyperlipidemia   . Neuromuscular disorder ([place])   . Sleep apnea   . Swelling of lower extremity    right more than leg leg  . Umbilical hernia     Family History  Problem Relation Age of Onset  . Thyroid disease Mother   . Heart disease Father   . Diabetes Maternal Grandfather     Past Surgical History:  Procedure Laterality Date  . 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 BACK SURGERY  2003   L4, L5  . BACK SURGERY    . ENDOVENOUS ABLATION SAPHENOUS VEIN W/ LASER Right 06-01-2014   EVLA right greater saphenous vein by Curt Jews MD   . epidural steroid injections        [facility] dr newton  . hernia repair  2003  . HERNIA REPAIR     hernia inguinal x 2  . TOTAL KNEE ARTHROPLASTY Left 03/22/2015   Procedure: TOTAL KNEE ARTHROPLASTY;  Surgeon: Meredith Pel, MD;  Location: [place];  Service: Orthopedics;  Laterality: Left;  .  UMBILICAL HERNIA REPAIR N/A 06/27/2016   Procedure: LAPAROSCOPIC ASSISTED REPAIR OF UMBILICAL HERINA WITH MESH;  Surgeon: Johnathan Hausen, MD;  Location: [facility];  Service: General;  Laterality: N/A;  . VASCULAR SURGERY  2015   right leg   Social History   Occupational History  . 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 Smoking status: Never Smoker  . Smokeless tobacco: Never Used  Substance and Sexual Activity  . Alcohol use: No  . Drug use: No  . Sexual activity: Yes    Birth control/protection: Condom

## 2018-01-16 LAB — URIC ACID: URIC ACID, SERUM: 7 mg/dL (ref 4.0–8.0)

## 2018-01-18 NOTE — Telephone Encounter (Signed)
Yes ok 

## 2018-01-18 NOTE — Telephone Encounter (Signed)
Called pt to schedule appt, vm was full and could not leave a message.

## 2018-01-19 ENCOUNTER — Encounter (INDEPENDENT_AMBULATORY_CARE_PROVIDER_SITE_OTHER): Payer: Self-pay | Admitting: Orthopaedic Surgery

## 2018-02-01 DIAGNOSIS — R0789 Other chest pain: Secondary | ICD-10-CM | POA: Diagnosis not present

## 2018-02-01 DIAGNOSIS — B338 Other specified viral diseases: Secondary | ICD-10-CM | POA: Diagnosis not present

## 2018-02-02 ENCOUNTER — Ambulatory Visit (INDEPENDENT_AMBULATORY_CARE_PROVIDER_SITE_OTHER): Payer: Medicare Other | Admitting: Physical Medicine and Rehabilitation

## 2018-02-02 ENCOUNTER — Encounter (INDEPENDENT_AMBULATORY_CARE_PROVIDER_SITE_OTHER): Payer: Self-pay | Admitting: Physical Medicine and Rehabilitation

## 2018-02-02 ENCOUNTER — Ambulatory Visit (INDEPENDENT_AMBULATORY_CARE_PROVIDER_SITE_OTHER): Payer: Medicare Other

## 2018-02-02 VITALS — BP 164/95 | HR 82 | Temp 98.3°F

## 2018-02-02 DIAGNOSIS — M5416 Radiculopathy, lumbar region: Secondary | ICD-10-CM | POA: Diagnosis not present

## 2018-02-02 MED ORDER — BETAMETHASONE SOD PHOS & ACET 6 (3-3) MG/ML IJ SUSP
12.0000 mg | Freq: Once | INTRAMUSCULAR | Status: AC
  Administered 2018-02-02: 12 mg

## 2018-02-02 NOTE — Procedures (Signed)
Lumbosacral Transforaminal Epidural Steroid Injection - Sub-Pedicular Approach with Fluoroscopic Guidance  Patient: [name]      Date of Birth: [birth year] MRN: [id number] PCP: Patient, No Pcp Per      Visit Date: 02/02/2018   Universal Protocol:    Date/Time: 02/02/2018  Consent Given By: the patient  Position: PRONE  Additional Comments: Vital signs were monitored before and after the procedure. Patient was prepped and draped in the usual sterile fashion. The correct patient, procedure, and site was verified.   Injection Procedure Details:  Procedure Site One Meds Administered:  Meds ordered this encounter  Medications  . betamethasone acetate-betamethasone sodium phosphate (CELESTONE) injection 12 mg    Laterality: Bilateral  Location/Site:  L4-L5  Needle size: 22 G  Needle type: Spinal  Needle Placement: Transforaminal  Findings:    -Comments: Excellent flow of contrast along the nerve and into the epidural space.  Procedure Details: After squaring off the end-plates to get a true AP view, the C-arm was positioned so that an oblique view of the foramen as noted above was visualized. The target area is just inferior to the "nose of the scotty dog" or sub pedicular. The soft tissues overlying this structure were infiltrated with 2-3 ml. of 1% Lidocaine without Epinephrine.  The spinal needle was inserted toward the target using a "trajectory" view along the fluoroscope beam.  Under AP and lateral visualization, the needle was advanced so it did not puncture dura and was located close the 6 O'Clock position of the pedical in AP tracterory. Biplanar projections were used to confirm position. Aspiration was confirmed to be negative for CSF and/or blood. A 1-2 ml. volume of Isovue-250 was injected and flow of contrast was noted at each level. Radiographs were obtained for documentation purposes.   After attaining the desired flow of contrast documented above, a 0.5  to 1.0 ml test dose of 0.25% Marcaine was injected into each respective transforaminal space.  The patient was observed for 90 seconds post injection.  After no sensory deficits were reported, and normal lower extremity motor function was noted,   the above injectate was administered so that equal amounts of the injectate were placed at each foramen (level) into the transforaminal epidural space.   Additional Comments:  The patient tolerated the procedure well Dressing: Band-Aid    Post-procedure details: Patient was observed during the procedure. Post-procedure instructions were reviewed.  Patient left the clinic in stable condition.

## 2018-02-02 NOTE — Progress Notes (Signed)
 .  Numeric Pain Rating Scale and Functional Assessment Average Pain 8   In the last MONTH (on 0-10 scale) has pain interfered with the following?  1. General activity like being  able to carry out your everyday physical activities such as walking, climbing stairs, carrying groceries, or moving a chair?  Rating(8)   +Driver, -BT, -Dye Allergies.  

## 2018-02-02 NOTE — Patient Instructions (Signed)

## 2018-02-09 ENCOUNTER — Encounter: Payer: Self-pay | Admitting: Physical Therapy

## 2018-02-09 ENCOUNTER — Other Ambulatory Visit: Payer: Self-pay

## 2018-02-09 ENCOUNTER — Ambulatory Visit: Payer: Medicare Other | Attending: Orthopedic Surgery | Admitting: Physical Therapy

## 2018-02-09 DIAGNOSIS — R2689 Other abnormalities of gait and mobility: Secondary | ICD-10-CM | POA: Diagnosis not present

## 2018-02-09 DIAGNOSIS — M6281 Muscle weakness (generalized): Secondary | ICD-10-CM | POA: Diagnosis not present

## 2018-02-09 DIAGNOSIS — R2681 Unsteadiness on feet: Secondary | ICD-10-CM | POA: Insufficient documentation

## 2018-02-09 DIAGNOSIS — R293 Abnormal posture: Secondary | ICD-10-CM | POA: Diagnosis not present

## 2018-02-09 DIAGNOSIS — M5441 Lumbago with sciatica, right side: Secondary | ICD-10-CM | POA: Diagnosis not present

## 2018-02-09 DIAGNOSIS — M25662 Stiffness of left knee, not elsewhere classified: Secondary | ICD-10-CM | POA: Diagnosis not present

## 2018-02-09 DIAGNOSIS — G8929 Other chronic pain: Secondary | ICD-10-CM | POA: Diagnosis not present

## 2018-02-09 NOTE — Therapy (Signed)
[facility] [address] [place], [place], [address] Phone: [phone]   Fax:  [phone]  Physical Therapy Evaluation  Patient Details  Name: [name] MRN: [id number] Date of Birth: 09/01/1941 Referring Provider: Meredith Pel, MD    Encounter Date: 02/09/2018  PT End of Session - 02/09/18 1603    Visit Number  1    Number of Visits  17    Date for PT Re-Evaluation  04/06/18    PT Start Time  6606    PT Stop Time  1500    PT Time Calculation (min)  57 min    Equipment Utilized During Treatment  Gait belt    Activity Tolerance  Patient tolerated treatment well    Behavior During Therapy  [facility] for tasks assessed/performed;Anxious       Past Medical History:  Diagnosis Date  . Allergy    takes Zyrtec daily  . Anxiety    takes Ativan daily as needed  . Arthritis   . Back pain   . BPH (benign prostatic hyperplasia)    takes doxazosin for  . 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 Depression    takes Wellbutrin daily  . Dyspnea on exertion    with exertion  . GERD (gastroesophageal reflux disease)    takes Nexium and Omeprazole daily  . History of kidney stones   . 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 Hyperlipidemia   . Neuromuscular disorder ([place])   . Sleep apnea   . Swelling of lower extremity    right more than leg leg  . Umbilical hernia     Past Surgical History:  Procedure Laterality Date  . 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 BACK SURGERY  2003   L4, L5  . BACK SURGERY    . ENDOVENOUS ABLATION SAPHENOUS VEIN W/ LASER Right 06-01-2014   EVLA right greater saphenous vein by Curt Jews MD   . epidural steroid injections        [facility]  . hernia repair  2003  . HERNIA REPAIR     hernia inguinal x 2  . TOTAL KNEE ARTHROPLASTY Left 03/22/2015   Procedure: TOTAL KNEE ARTHROPLASTY;  Surgeon: Meredith Pel, MD;   Location: [facility];  Service: Orthopedics;  Laterality: Left;  . UMBILICAL HERNIA REPAIR N/A 06/27/2016   Procedure: LAPAROSCOPIC ASSISTED REPAIR OF UMBILICAL HERINA WITH MESH;  Surgeon: Johnathan Hausen, MD;  Location: [facility];  Service: General;  Laterality: N/A;  . VASCULAR SURGERY  2015   right leg    There were no vitals filed for this visit.   Subjective Assessment - 02/09/18 1414    Subjective  Pt is a 77 y/o M who has been referred to OPPT by Meredith Pel, MD for Bilateral LE weakness on 10/09/2017. pt last epidural was last Tuesday(3/26). Patient reports intermittent relief with injections. When it helps it can last up to 6 months. (pt reports he works in [employer] that is starting soon). Pt reports he has said no to surgery in the past. Pt reports he also has a Right knee surgery coming up.  pt reports he had significant R leg numbness yesterday. Pt reports not being able to fully extend his knee while standing and his RLE is shorter than his Lf. Pt is concerned about his loss of LE strength. Pt reports  that due to his bathroom being on the secound floor, he often steps outside to urinate to avoid stairs. Pt  also must descend stairs to basement often to pump water out. He reports stairs are steep and can be difficult. patient is also trying to improve function soon in order to take part in a work event coming up in the next month or so.     Pertinent History  MRI 08/07/2017)DDD(L2-L3 and L3-L4)moderate Spinal stenosis. L2-L3 neural foramina stenosis. severe spinal canal stenosis and bilateral neural foramina stenosis L4-L5. Hyperlipidemia, venous insufficiency, basal cell carcinoma(2017), spondylolisthesis, Achilles tendon contracture( bilateral).finger amputation, ( L3-4 back surgery, L TKA, Obesity, R knee OA    Limitations  Walking;Standing;House hold activities    Patient Stated Goals  Pt would like to improve leg strength for work.    Currently in Pain?  Yes 0/10     Pain  Score  -- " large about" no number givem    Pain Location  Back    Pain Orientation  Lower    Pain Descriptors / Indicators  Aching;Burning    Pain Type  Chronic pain    Pain Radiating Towards  bilateral feet    Pain Onset  More than a month ago    Pain Frequency  Constant    Aggravating Factors   standing up right    Pain Relieving Factors  medication and elevation of feet and    Effect of Pain on Daily Activities  standing,          [facility] PT Assessment - 02/09/18 1400      Assessment   Medical Diagnosis  Spinal stenosis     Referring Provider  Meredith Pel, MD     Onset Date/Surgical Date  10/09/17 referral to PT    Hand Dominance  Right    Prior Therapy  Yes PT      Precautions   Precautions  Fall      Restrictions   Weight Bearing Restrictions  No      Balance Screen   Has the patient fallen in the past 6 months  No    Has the patient had a decrease in activity level because of a fear of falling?   No    Is the patient reluctant to leave their home because of a fear of falling?   No      Home Environment   Living Environment  Private residence    Living Arrangements  Alone    Available Help at Discharge  -- none    Type of Walker to enter    Entrance Stairs-Number of Steps  3    Entrance Stairs-Rails  Cannot reach both;Right;Left    Home Layout  Two level    Alternate Level Stairs-Number of Steps  20 20 to 2nd floor & 14 to basement    Alternate Level Stairs-Rails  Left none for the basement    Home Equipment  Walker - 2 wheels;Cane - single point      Prior Function   Level of Independence  Independent with basic ADLs    Vocation  Full time employment    Vocation Requirements  [employer]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 Small dry sore on Rt. Great toe nail bed,  yellow/green discoloration of Lt. Great toe (superficial). Bilateral UE and LW dry flakey skin. RLE is significantly cooler to the touch than Rt.  bilateralcalf muscle wasting       Sensation   Light Touch  Impaired by gross assessment altered  foot ankle and medial shin sensation       Posture/Postural Control   Posture/Postural Control  Postural limitations    Postural Limitations  Forward head;Rounded Shoulders;Increased lumbar lordosis;Flexed trunk      ROM / Strength   AROM / PROM / Strength  Strength;AROM      AROM   Overall AROM   Deficits    AROM Assessment Site  Knee    Right/Left Knee  Left    Left Knee Extension  -5 5 degrees short of full extension       Strength   Overall Strength  Deficits    Strength Assessment Site  Knee;Ankle;Hip    Right/Left Hip  Left;Right    Right Hip Flexion  4/5    Left Hip Flexion  4/5    Right/Left Knee  Right;Left    Right Knee Extension  4/5    Left Knee Extension  4/5    Right/Left Ankle  Right;Left    Right Ankle Dorsiflexion  3+/5    Right Ankle Plantar Flexion  3/5    Right Ankle Inversion  3+/5    Right Ankle Eversion  3+/5    Left Ankle Dorsiflexion  4/5    Left Ankle Plantar Flexion  4/5    Left Ankle Inversion  3+/5    Left Ankle Eversion  3/5      Transfers   Transfers  Sit to Stand;Stand to Sit    Sit to Stand  4: Min guard    Stand to Sit  4: Min guard    Comments  unsteady on feet (unable to Plantarflex R ankle in standing      Ambulation/Gait   Ambulation/Gait  Yes    Ambulation/Gait Assistance  5: Supervision    Ambulation/Gait Assistance Details  unsteady on feet     Ambulation Distance (Feet)  120 Feet    Assistive device  None    Gait Pattern  Left flexed knee in stance;Lateral trunk lean to left;Decreased weight shift to right;Decreased trunk rotation;Trunk flexed;Poor foot clearance - left;Step-through  pattern;Decreased step length - left;Decreased step length - right    Ambulation Surface  Level;Indoor    Gait velocity  3.14ft/sec      Standardized Balance Assessment   Standardized Balance Assessment  Timed Up and Go Test      Timed Up and Go Test   TUG  Normal TUG    Normal TUG (seconds)  9.09    TUG Comments  TUG score does not indicate falls risk                 Objective measurements completed on examination: See above findings.              PT Education - 02/09/18 1602    Education provided  Yes    Education Details  PT POC, Falls risk reduction    Person(s) Educated  Patient    Methods  Explanation;Demonstration    Comprehension  Verbalized understanding;Returned demonstration       PT Short Term Goals - 02/09/18 1710      PT SHORT TERM GOAL #  1   Title  Patient will be Independent with initial HEP to facilitate progress during PT sessions(03/09/2018 target date for all goals)    Time  4    Period  Weeks    Status  New    Target Date  03/09/18      PT SHORT TERM GOAL #2   Title  Patient will demonstrate 560ft ambulation with LRAD Mod I(outdoors) with good foot clearance to improve muscular endurance     Time  4    Period  Weeks    Status  New    Target Date  03/09/18      PT SHORT TERM GOAL #3   Title  Pt will demonstrate stair negotiating 16 steps (total) with single railing, at supervision level in order to simulate home set and address muscular endurance    Time  4    Period  Weeks    Status  New    Target Date  03/09/18      PT SHORT TERM GOAL #4   Title  Pt will demonstrate or verbalize methods used to reduce acute pain flare ups     Time  4    Period  Weeks    Status  New    Target Date  03/09/18        PT Long Term Goals - 02/09/18 1712      PT LONG TERM GOAL #1   Title  Pt will demonstrate stair negotiation 24 steps (total) mod I with one hand rail  to simulate home environment and to address muscular endurance. (all LTGs  target date 04/09/2018)    Time  8    Period  Weeks    Status  New    Target Date  04/09/18      PT LONG TERM GOAL #2   Title  Pt will ambulate 1070ft with LRAD at Mod I over grass and pavement in order to simulate home and work environment.     Time  8    Period  Weeks    Status  New    Target Date  04/09/18      PT LONG TERM GOAL #3   Title  Pt will demonstrate ability to pick up small objects from ankle, knee height (5-15#) with correct lifting mechanics simulating work related tasks    Time  8    Period  Weeks    Status  New    Target Date  04/09/18      PT LONG TERM GOAL #4   Title  Pt will be independent with ongoing HEP/Fitness program in order to maintain function & help control pain.     Time  8    Period  Weeks    Status  New    Target Date  04/09/18             Plan - 02/09/18 1703    Clinical Impression Statement  Pt presents to PT with significant a history of Lumbar back pain that radiates down bilateral LE. Patient has had intermittent reduction in pain with spinal injections. Pt also presents with muscle weakness(R>L) as well as reduction in Lt. Knee ROM both greatly contributing to patients gait deviations. Patient currently is ambulating with a gait speed of 3.73ft/second indicating he currently is at a community level ambulatory pace. Patient also completed the time up and go assessment with a time of 9.09sec. Also indicating low falls risk with dynamic gait. Patient is currently ambulating  without any assistive devices and demonstrates adequate dynamic gait stability to be considered a low falls risk. However, based on patient muscular weakness and subjective reports of poor foot clearance and frequent near falls from catching feet an objects, Pt remains a falls risk.   In order for patient to reach his goals of improving his functional strength and ability to work in his antique [employer] (work that requires long walking and lifting demands) pt will greatly  benefit form PT in order to address issues above.     History and Personal Factors relevant to plan of care:  MRI 08/07/2017)DDD(L2-L3 and L3-L4)moderate Spinal stenosis. L2-L3 neural foramina stenosis. severe spinal canal stenosis and bilateral neural foramina stenosis L4-L5. Hyperlipidemia, venous insufficiency, basal cell carcinoma(2017), spondylolisthesis, Achilles tendon contracture( bilateral).finger amputation, ( L3-4 back surgery, L TKA, Obesity, R knee OA    Clinical Presentation  Evolving    Clinical Presentation due to:  progression of spinal stenosis and level of progressing muscle weakness    Clinical Decision Making  Moderate    Rehab Potential  Good    Clinical Impairments Affecting Rehab Potential  good motivation    PT Frequency  2x / week    PT Duration  8 weeks    PT Treatment/Interventions  ADLs/Self Care Home Management;Biofeedback;Cryotherapy;Electrical Stimulation;Iontophoresis 4mg /ml Dexamethasone;Moist Heat;Traction;Ultrasound;Gait training;Stair training;Functional mobility training;Therapeutic activities;Therapeutic exercise;Balance training;Neuromuscular re-education;Patient/family education;Orthotic Fit/Training;Compression bandaging;Passive range of motion;Energy conservation    PT Next Visit Plan  establish inital HEP, review patients current HEP and fitness plan for safety.     Consulted and Agree with Plan of Care  Patient       Patient will benefit from skilled therapeutic intervention in order to improve the following deficits and impairments:  Abnormal gait, Decreased skin integrity, Impaired sensation, Pain, Decreased mobility, Postural dysfunction, Decreased activity tolerance, Decreased range of motion, Decreased strength, Difficulty walking, Impaired flexibility, Obesity  Visit Diagnosis: Muscle weakness (generalized)  Other abnormalities of gait and mobility  Chronic bilateral low back pain with right-sided sciatica  Stiffness of left knee, not  elsewhere classified  Unsteadiness on feet  Abnormal posture     Problem List Patient Active Problem List   Diagnosis Date Noted  . Spinal stenosis of lumbar region with neurogenic claudication 01/15/2017  . Spondylolisthesis of lumbar region 01/15/2017  . Claw toe, acquired, left 01/06/2017  . Achilles tendon contracture, bilateral 01/06/2017  . Pain in metatarsus of both feet 01/06/2017  . Tubular adenoma of colon 12/29/2016  . Basal cell carcinoma 08/19/2016  . Umbilical hernia s/p lap repair with mesh 06/27/2016 06/27/2016  . Degenerative arthritis of knee 03/22/2015  . Dyspnea on exertion 01/31/2015  . Lumbar radiculopathy 12/12/2014  . Facet hypertrophy of lumbar region 12/12/2014  . Left knee pain 12/12/2014  . Ulcer of lower limb ([place]) 01/10/2014  . Varicose veins of lower extremities with other complications 49/44/9675  . 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 Renal insufficiency 12/21/2013  . Swelling in head/neck 07/28/2012  . Venous (peripheral) insufficiency 05/25/2012  . 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 Pain in limb 02/12/2012  . Hyperlipidemia 02/28/2008  . Allergic rhinitis 02/28/2008  . Sleep apnea 02/28/2008   Waunita Schooner SPT 02/09/2018, 5:15 PM  WALDRON,ROBIN PT, DPT 02/10/2018, 6:55 AM  [facility] [address] [place], [place], [address] Phone: [phone]   Fax:  [phone]  Name: [name] MRN: [id number] Date of Birth: [birth year]

## 2018-02-10 NOTE — Progress Notes (Signed)
[name] - 77 y.o. male MRN [id number]  Date of birth: 1941-05-02  Office Visit Note: Visit Date: 02/02/2018 PCP: Patient, No Pcp Per Referred by: No ref. provider found  Subjective: Chief Complaint  Patient presents with  . Lower Back - Pain  . Right Leg - Pain  . Left Leg - Pain   HPI: [name] is a 77 year old gentleman that we have seen in the past for interventional procedure of the lumbar spine.  He has known facet arthropathy and listhesis and stenosis particularly the L4-5 level but really multilevel facet arthropathy.  He is followed in the office by Dr. Lorin Mercy and is actually seeing Dr. Louanne Skye for evaluation for possible surgical intervention of the lumbar spine.  It appears Dr. Louanne Skye 1 to see him back for reevaluation but he has not gone back to see him.  He comes in today with pain in the back and radiating down both legs.  He says is been going on for several weeks and progressively worsening.  Last injection was performed in October of last year which was a bilateral L4 transforaminal epidural steroid injection with really good relief.  He reports any movement especially lifting is making things worse.  He does report easing of pain with lying down with his feet elevated.  He still reports significant swelling of the legs and he continues on a diuretic.  He is going to start trying to do some swimming he is using a back brace at times.  He also reports testosterone use weekly and this is something is done for quite some time and is wondering if that does anything in terms of his back pain.  I doubt the testosterone replacement has any issues in terms of his back pain.   ROS Otherwise per HPI.  Assessment & Plan: Visit Diagnoses:  1. Lumbar radiculopathy     Plan: No additional findings.   Meds & Orders:  Meds ordered this encounter  Medications  . betamethasone acetate-betamethasone sodium phosphate (CELESTONE) injection 12 mg    Orders Placed This Encounter    Procedures  . XR C-ARM NO REPORT  . Epidural Steroid injection    Follow-up: Return if symptoms worsen or fail to improve.   Procedures: No procedures performed  Lumbosacral Transforaminal Epidural Steroid Injection - Sub-Pedicular Approach with Fluoroscopic Guidance  Patient: [name]      Date of Birth: 07-20-1941 MRN: [id number] PCP: Patient, No Pcp Per      Visit Date: 02/02/2018   Universal Protocol:    Date/Time: 02/02/2018  Consent Given By: the patient  Position: PRONE  Additional Comments: Vital signs were monitored before and after the procedure. Patient was prepped and draped in the usual sterile fashion. The correct patient, procedure, and site was verified.   Injection Procedure Details:  Procedure Site One Meds Administered:  Meds ordered this encounter  Medications  . betamethasone acetate-betamethasone sodium phosphate (CELESTONE) injection 12 mg    Laterality: Bilateral  Location/Site:  L4-L5  Needle size: 22 G  Needle type: Spinal  Needle Placement: Transforaminal  Findings:    -Comments: Excellent flow of contrast along the nerve and into the epidural space.  Procedure Details: After squaring off the end-plates to get a true AP view, the C-arm was positioned so that an oblique view of the foramen as noted above was visualized. The target area is just inferior to the "nose of the scotty dog" or sub pedicular. The soft tissues overlying this structure were  infiltrated with 2-3 ml. of 1% Lidocaine without Epinephrine.  The spinal needle was inserted toward the target using a "trajectory" view along the fluoroscope beam.  Under AP and lateral visualization, the needle was advanced so it did not puncture dura and was located close the 6 O'Clock position of the pedical in AP tracterory. Biplanar projections were used to confirm position. Aspiration was confirmed to be negative for CSF and/or blood. A 1-2 ml. volume of Isovue-250 was injected  and flow of contrast was noted at each level. Radiographs were obtained for documentation purposes.   After attaining the desired flow of contrast documented above, a 0.5 to 1.0 ml test dose of 0.25% Marcaine was injected into each respective transforaminal space.  The patient was observed for 90 seconds post injection.  After no sensory deficits were reported, and normal lower extremity motor function was noted,   the above injectate was administered so that equal amounts of the injectate were placed at each foramen (level) into the transforaminal epidural space.   Additional Comments:  The patient tolerated the procedure well Dressing: Band-Aid    Post-procedure details: Patient was observed during the procedure. Post-procedure instructions were reviewed.  Patient left the clinic in stable condition.    Clinical History: Lumbar Spine MRI 2014 L3-4:  A diffuse bulging annulus, short pedicles and facet disease contribute to moderate spinal and bilateral lateral recess stenosis and mild bilateral foraminal stenosis.  This appears relatively stable.  L4-5:  Degenerative anterolisthesis of L4 with a bulging uncovered disc, short pedicles and severe facet disease contributing to moderately severe spinal and bilateral lateral recess stenosis and moderately severe bilateral foraminal stenosis.  These findings relatively stable.  L5-S1:  Persistent focal central disc protrusion and severe facet disease contributing to spinal and lateral recess stenosis with persistent mass effect on the right S1 nerve root.  No significant foraminal stenosis.  IMPRESSION: Severe degenerative lumbar spondylosis with multilevel multifactorial spinal, lateral recess and foraminal stenosis as specifically discussed above at the individual levels.  No significant interval change when compared the prior study.  Cervical MRI 08/30/2016 IMPRESSION: Mild progressive degenerative changes when compared to  prior exam. Summary of pertinent findings includes:  C1-2 degenerative changes with small amount of fluid right C1-2 lateral mass articulation.  C2-3 no significant spinal stenosis or foraminal narrowing.  C3-4 multifactorial mild to moderate foraminal narrowing greater on the left. Vertebral arteries extends into neural foramen. Spur posterior superior aspect of the C4 vertebra greatest left paracentral position. Mild indentation ventral thecal sac and minimal left-sided cord contact.  C4-5 broad-based disc osteophyte complex greater to the right. Spinal stenosis and cord flattening greater on the right. Multifactorial marked bilateral foraminal narrowing greater on the right.  C5-6 broad-based disc osteophyte complex greater to the right. Mild right-sided cord flattening. Multifactorial mild to moderate right foraminal narrowing.  C6-7 broad-based disc osteophyte complex slightly greater to left. Ventral thecal sac narrowing with minimal left-sided cord contact. Multifactorial marked bilateral foraminal narrowing.  C7-T1 bulge and spur with narrowing ventral thecal sac. Moderate bilateral foraminal narrowing.   He reports that he has never smoked. He has never used smokeless tobacco.  Recent Labs    01/15/18 1432  LABURIC 7.0    Objective:  VS:  HT:    WT:   BMI:     BP:(!) 164/95  HR:82bpm  TEMP:98.3 F (36.8 C)(Oral)  RESP:96 % Physical Exam  Musculoskeletal:  He is stiff with extension and has concordant pain with extension of  the lumbar spine.  He has no pain with hip rotation has no pain over the greater trochanters.  He has good distal strength.  He does have swelling in both legs.    Ortho Exam Imaging: No results found.  Past Medical/Family/Surgical/Social History: Medications & Allergies reviewed per EMR, new medications updated. Patient Active Problem List   Diagnosis Date Noted  . Spinal stenosis of lumbar region with neurogenic claudication  01/15/2017  . Spondylolisthesis of lumbar region 01/15/2017  . Claw toe, acquired, left 01/06/2017  . Achilles tendon contracture, bilateral 01/06/2017  . Pain in metatarsus of both feet 01/06/2017  . Tubular adenoma of colon 12/29/2016  . Basal cell carcinoma 08/19/2016  . Umbilical hernia s/p lap repair with mesh 06/27/2016 06/27/2016  . Degenerative arthritis of knee 03/22/2015  . Dyspnea on exertion 01/31/2015  . Lumbar radiculopathy 12/12/2014  . Facet hypertrophy of lumbar region 12/12/2014  . Left knee pain 12/12/2014  . Ulcer of lower limb ([place]) 01/10/2014  . Varicose veins of lower extremities with other complications 75/64/3329  . 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 Renal insufficiency 12/21/2013  . Swelling in head/neck 07/28/2012  . Venous (peripheral) insufficiency 05/25/2012  . 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 Pain in limb 02/12/2012  . Hyperlipidemia 02/28/2008  . Allergic rhinitis 02/28/2008  . Sleep apnea 02/28/2008   Past Medical History:  Diagnosis Date  . Allergy    takes Zyrtec daily  . Anxiety    takes Ativan daily as needed  . Arthritis   . Back pain   . BPH (benign prostatic hyperplasia)    takes doxazosin for  . 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 Depression    takes Wellbutrin daily  . Dyspnea on exertion    with exertion  . GERD (gastroesophageal reflux disease)    takes Nexium and Omeprazole daily  . History of kidney stones   . Hx of [facility] spotted fever childhood  . Hyperlipidemia   . Neuromuscular disorder ([place])   . Sleep apnea   . Swelling of lower extremity    right more than leg leg  . Umbilical hernia    Family History  Problem Relation Age of Onset  . Thyroid disease Mother   . Heart disease Father   . Diabetes Maternal Grandfather    Past Surgical History:  Procedure Laterality Date  . 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 BACK SURGERY   2003   L4, L5  . BACK SURGERY    . ENDOVENOUS ABLATION SAPHENOUS VEIN W/ LASER Right 06-01-2014   EVLA right greater saphenous vein by Curt Jews MD   . epidural steroid injections        [facility] dr Vernon Maish  . hernia repair  2003  . HERNIA REPAIR     hernia inguinal x 2  . TOTAL KNEE ARTHROPLASTY Left 03/22/2015   Procedure: TOTAL KNEE ARTHROPLASTY;  Surgeon: Meredith Pel, MD;  Location: [facility];  Service: Orthopedics;  Laterality: Left;  . UMBILICAL HERNIA REPAIR N/A 06/27/2016   Procedure: LAPAROSCOPIC ASSISTED REPAIR OF UMBILICAL HERINA WITH MESH;  Surgeon: Johnathan Hausen, MD;  Location: [facility];  Service: General;  Laterality: N/A;  . VASCULAR SURGERY  2015   right leg   Social History   Occupational History  . 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 Smoking status: Never Smoker  .  Smokeless tobacco: Never Used  Substance and Sexual Activity  . Alcohol use: No  . Drug use: No  . Sexual activity: Yes    Birth control/protection: Condom

## 2018-02-12 DIAGNOSIS — R05 Cough: Secondary | ICD-10-CM | POA: Diagnosis not present

## 2018-02-12 DIAGNOSIS — J209 Acute bronchitis, unspecified: Secondary | ICD-10-CM | POA: Diagnosis not present

## 2018-02-12 DIAGNOSIS — R0989 Other specified symptoms and signs involving the circulatory and respiratory systems: Secondary | ICD-10-CM | POA: Diagnosis not present

## 2018-02-12 DIAGNOSIS — R42 Dizziness and giddiness: Secondary | ICD-10-CM | POA: Diagnosis not present

## 2018-02-15 ENCOUNTER — Encounter: Payer: Self-pay | Admitting: Physical Therapy

## 2018-02-15 ENCOUNTER — Ambulatory Visit: Payer: Medicare Other | Admitting: Physical Therapy

## 2018-02-15 DIAGNOSIS — R2689 Other abnormalities of gait and mobility: Secondary | ICD-10-CM

## 2018-02-15 DIAGNOSIS — M6281 Muscle weakness (generalized): Secondary | ICD-10-CM

## 2018-02-15 DIAGNOSIS — R5382 Chronic fatigue, unspecified: Secondary | ICD-10-CM | POA: Diagnosis not present

## 2018-02-15 DIAGNOSIS — R2681 Unsteadiness on feet: Secondary | ICD-10-CM | POA: Diagnosis not present

## 2018-02-15 DIAGNOSIS — M5441 Lumbago with sciatica, right side: Secondary | ICD-10-CM | POA: Diagnosis not present

## 2018-02-15 DIAGNOSIS — M25662 Stiffness of left knee, not elsewhere classified: Secondary | ICD-10-CM

## 2018-02-15 DIAGNOSIS — N183 Chronic kidney disease, stage 3 (moderate): Secondary | ICD-10-CM | POA: Diagnosis not present

## 2018-02-15 DIAGNOSIS — R293 Abnormal posture: Secondary | ICD-10-CM

## 2018-02-15 DIAGNOSIS — R7303 Prediabetes: Secondary | ICD-10-CM | POA: Diagnosis not present

## 2018-02-15 DIAGNOSIS — G4733 Obstructive sleep apnea (adult) (pediatric): Secondary | ICD-10-CM | POA: Diagnosis not present

## 2018-02-15 DIAGNOSIS — G8929 Other chronic pain: Secondary | ICD-10-CM

## 2018-02-15 DIAGNOSIS — F411 Generalized anxiety disorder: Secondary | ICD-10-CM | POA: Diagnosis not present

## 2018-02-15 DIAGNOSIS — M48062 Spinal stenosis, lumbar region with neurogenic claudication: Secondary | ICD-10-CM | POA: Diagnosis not present

## 2018-02-15 DIAGNOSIS — E782 Mixed hyperlipidemia: Secondary | ICD-10-CM | POA: Diagnosis not present

## 2018-02-15 DIAGNOSIS — F331 Major depressive disorder, recurrent, moderate: Secondary | ICD-10-CM | POA: Diagnosis not present

## 2018-02-15 NOTE — Therapy (Signed)
[facility] [address] [place], [place], [address] Phone: [phone]   Fax:  [phone]  Physical Therapy Treatment  Patient Details  Name: [name] MRN: [id number] Date of Birth: 1941/01/25 Referring Provider: Meredith Pel, MD    Encounter Date: 02/15/2018  PT End of Session - 02/15/18 1451    Visit Number  2    Number of Visits  17    Date for PT Re-Evaluation  04/06/18    PT Start Time  1324    PT Stop Time  1451    PT Time Calculation (min)  46 min    Equipment Utilized During Treatment  Gait belt    Activity Tolerance  Patient tolerated treatment well;Patient limited by pain    Behavior During Therapy  [facility] for tasks assessed/performed;Anxious       Past Medical History:  Diagnosis Date  . Allergy    takes Zyrtec daily  . Anxiety    takes Ativan daily as needed  . Arthritis   . Back pain   . BPH (benign prostatic hyperplasia)    takes doxazosin for  . 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 Depression    takes Wellbutrin daily  . Dyspnea on exertion    with exertion  . GERD (gastroesophageal reflux disease)    takes Nexium and Omeprazole daily  . History of kidney stones   . Hx of [facility] spotted fever childhood  . Hyperlipidemia   . Neuromuscular disorder ([place])   . Sleep apnea   . Swelling of lower extremity    right more than leg leg  . Umbilical hernia     Past Surgical History:  Procedure Laterality Date  . 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 BACK SURGERY  2003   L4, L5  . BACK SURGERY    . ENDOVENOUS ABLATION SAPHENOUS VEIN W/ LASER Right 06-01-2014   EVLA right greater saphenous vein by Curt Jews MD   . epidural steroid injections        [facility] dr [place]  . hernia repair  2003  . HERNIA REPAIR     hernia inguinal x 2  . TOTAL KNEE ARTHROPLASTY Left 03/22/2015   Procedure: TOTAL KNEE ARTHROPLASTY;  Surgeon: Meredith Pel, MD;  Location: [facility];  Service: Orthopedics;  Laterality: Left;  . UMBILICAL HERNIA REPAIR N/A 06/27/2016   Procedure: LAPAROSCOPIC ASSISTED REPAIR OF UMBILICAL HERINA WITH MESH;  Surgeon: Johnathan Hausen, MD;  Location[facility];  Service: General;  Laterality: N/A;  . VASCULAR SURGERY  2015   right leg    There were no vitals filed for this visit.  Subjective Assessment - 02/15/18 1408    Subjective  pt reports no falls since last PT session. 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 pt reports he is not willing to try working with a Rollator. pt reports he is able to sit about 30 min before the pain  is significant with his upper back.     Pertinent History  MRI 08/07/2017)DDD(L2-L3 and L3-L4)moderate Spinal stenosis. L2-L3 neural foramina stenosis. severe spinal canal stenosis and bilateral neural foramina stenosis L4-L5. Hyperlipidemia, venous insufficiency, basal cell carcinoma(2017), spondylolisthesis, Achilles tendon contracture( bilateral).finger amputation, ( L3-4 back surgery, L TKA, Obesity, R knee OA    Limitations  Walking;Standing;House hold activities  Patient Stated Goals  Pt would like to improve leg strength for work.    Currently in Pain?  Yes    Pain Score  -- no number given     Pain Location  Back    Pain Orientation  Lower;Right    Pain Descriptors / Indicators  Aching    Pain Type  Chronic pain    Pain Onset  More than a month ago    Pain Frequency  Constant    Multiple Pain Sites  Yes    Pain Score  0 in am8-9/10 and when sitting 0/10     Pain Location  Foot    Pain Orientation  Left    Pain Descriptors / Indicators  Aching;Sharp    Pain Type  Chronic pain    Pain Onset  1 to 4 weeks ago    Pain Frequency  Intermittent morning and after stops    Aggravating Factors   starting movements after rest    Pain Relieving Factors  continuous movement                         [facility] Adult PT Treatment/Exercise - 02/15/18 1405      Transfers   Transfers  Supine to Sit;Sit to Stand;Stand to Sit    Sit to Stand  6: Modified independent (Device/Increase time)    Stand to Sit  6: Modified independent (Device/Increase time)    Supine to Sit  4: Min assist multiple attemps. needed HHA to get up    Supine to Sit Details (indicate cue type and reason)  PT demo log rolling and sit to stand, min A    Supine to Sit Details  Verbal cues for sequencing;Verbal cues for technique;Verbal cues for precautions/safety;Tactile cues for placement;Tactile cues for posture      Ambulation/Gait   Ambulation/Gait  Yes    Ambulation/Gait Assistance  5: Supervision    Ambulation Distance (Feet)  120 Feet    Assistive device  None    Gait Pattern  Left flexed knee in stance;Lateral trunk lean to left;Decreased weight shift to right;Decreased trunk rotation;Trunk flexed;Poor foot clearance - left;Step-through pattern;Decreased step length - left;Decreased step length - right      Posture/Postural Control   Posture/Postural Control  Postural limitations    Postural Limitations  Forward head;Rounded Shoulders;Increased lumbar lordosis;Flexed trunk    Posture Comments  HEP: chin tuck schoulder retraction PT demo, verbal cues, tactile cues       Self-Care   Self-Care  ADL's;Posture    ADL's  pt instructed to place resting spots around warehouse in order  to rest during long distance ambulations where he reports his LBP increases  and is reduced with  seated rest breaks. pt instructed to do log roll transfer(HEP) to get in to and out of bed.  to reduce LBP    Posture  HEP, Patient instructed to take standing breaks during long duration seated activities( computer work)       Exercises   Exercises  Other Exercises    Other Exercises   HEP: double knee to chest and supine lumbar rotations. PT verbal cues, tactile cues, manual manipulation for correct movement  patterns see HEP for details             PT Education - 02/15/18 1510    Education provided  Yes    Education Details  rest breaks when back pain has increased during long duration standing activites, movement  breaks  during long duration sitting tasks, OA and morning pain, sleeping positions (pillows and body position), transfers, HEP exercises    Person(s) Educated  Patient    Methods  Explanation;Demonstration    Comprehension  Verbalized understanding;Need further instruction       PT Short Term Goals - 02/09/18 1710      PT SHORT TERM GOAL #1   Title  Patient will be Independent with initial HEP to facilitate progress during PT sessions(03/09/2018 target date for all goals)    Time  4    Period  Weeks    Status  New    Target Date  03/09/18      PT SHORT TERM GOAL #2   Title  Patient will demonstrate 559ft ambulation with LRAD Mod I(outdoors) with good foot clearance to improve muscular endurance     Time  4    Period  Weeks    Status  New    Target Date  03/09/18      PT SHORT TERM GOAL #3   Title  Pt will demonstrate stair negotiating 16 steps (total) with single railing, at supervision level in order to simulate home set and address muscular endurance    Time  4    Period  Weeks    Status  New    Target Date  03/09/18      PT SHORT TERM GOAL #4   Title  Pt will demonstrate or verbalize methods used to reduce acute pain flare ups     Time  4    Period  Weeks    Status  New    Target Date  03/09/18        PT Long Term Goals - 02/09/18 1712      PT LONG TERM GOAL #1   Title  Pt will demonstrate stair negotiation 24 steps (total) mod I with one hand rail  to simulate home environment and to address muscular endurance. (all LTGs target date 04/09/2018)    Time  8    Period  Weeks    Status  New    Target Date  04/09/18      PT LONG TERM GOAL #2   Title  Pt will ambulate 1019ft with LRAD at Mod I over grass and pavement in order to simulate home and work  environment.     Time  8    Period  Weeks    Status  New    Target Date  04/09/18      PT LONG TERM GOAL #3   Title  Pt will demonstrate ability to pick up small objects from ankle, knee height (5-15#) with correct lifting mechanics simulating work related tasks    Time  8    Period  Weeks    Status  New    Target Date  04/09/18      PT LONG TERM GOAL #4   Title  Pt will be independent with ongoing HEP/Fitness program in order to maintain function & help control pain.     Time  8    Period  Weeks    Status  New    Target Date  04/09/18            Plan - 02/15/18 1520    Clinical Impression Statement  During today's PT session education was provided to patient about home and work modifications in order to further reduce patients Low back pain. 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 PT also was instructed on proper transfers into and out of his bed in order to reduce LBP when getting up in the morning and going to bed. PT also established initial HEP attempting to reduce LBP flare ups and correct posture. Pt is enjoys telling PT specifics about all subjects discussed and often needs redirecting on task at hand.     Rehab Potential  Good    Clinical Impairments Affecting Rehab Potential  good motivation    PT Frequency  2x / week    PT Duration  8 weeks    PT Next Visit Plan  Review patients current HEP and fitness plan for safety.( [employer] equipment that is safe to use)    Consulted and Agree with Plan of Care  Patient       Patient will benefit from skilled therapeutic intervention in order to improve the following deficits and impairments:  Abnormal gait, Decreased skin  integrity, Impaired sensation, Pain, Decreased mobility, Postural dysfunction, Decreased activity tolerance, Decreased range of motion, Decreased strength, Difficulty walking, Impaired flexibility, Obesity  Visit Diagnosis: Muscle weakness (generalized)  Other abnormalities of gait and mobility  Chronic bilateral low back pain with right-sided sciatica  Stiffness of left knee, not elsewhere classified  Unsteadiness on feet  Abnormal posture     Problem List Patient Active Problem List   Diagnosis Date Noted  . Spinal stenosis of lumbar region with neurogenic claudication 01/15/2017  . Spondylolisthesis of lumbar region 01/15/2017  . Claw toe, acquired, left 01/06/2017  . Achilles tendon contracture, bilateral 01/06/2017  . Pain in metatarsus of both feet 01/06/2017  . Tubular adenoma of colon 12/29/2016  . Basal cell carcinoma 08/19/2016  . Umbilical hernia s/p lap repair with mesh 06/27/2016 06/27/2016  . Degenerative arthritis of knee 03/22/2015  . Dyspnea on exertion 01/31/2015  . Lumbar radiculopathy 12/12/2014  . Facet hypertrophy of lumbar region 12/12/2014  . Left knee pain 12/12/2014  . Ulcer of lower limb ([place]) 01/10/2014  . Varicose veins of lower extremities with other complications 95/63/8756  . 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 Renal insufficiency 12/21/2013  . Swelling in head/neck 07/28/2012  . Venous (peripheral) insufficiency 05/25/2012  . 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 Pain in limb 02/12/2012  . Hyperlipidemia 02/28/2008  . Allergic rhinitis 02/28/2008  . Sleep apnea 02/28/2008    Waunita Schooner SPT 02/15/2018, 3:21 PM  [facility] [address] [place], [place], [address] Phone: [phone]   Fax:  [phone]  Name: [name] MRN: [id number] Date of Birth: 1941/05/10

## 2018-02-15 NOTE — Patient Instructions (Signed)
Shoulder Retraction    Tuck in chin and gently pull shoulders back. Hold _1-3___ seconds. Repeat __10__ times. Do _2___ sessions per day.  http://gt2.exer.us/4   Copyright  VHI. All rights reserved.  Lumbar Rotation (Non-Weight Bearing)    Feet on floor, slowly rock knees from side to side in small, pain-free range of motion. Allow lower back to rotate slightly. Repeat _20___ times per set. Do __2__ sets per session. Do _2___ sessions per day.  http://orth.exer.us/160   Copyright  VHI. All rights reserved.  BED MOBILITY: Side-Lying to Sit    Lie on side, move legs to edge of bed. Push down with both hands while moving legs off bed to reach sitting position. When you get up in the morning an when you get ready to get back in to bed.    Copyright  VHI. All rights reserved.

## 2018-02-17 ENCOUNTER — Encounter: Payer: Self-pay | Admitting: Physical Therapy

## 2018-02-17 ENCOUNTER — Ambulatory Visit: Payer: Medicare Other | Admitting: Physical Therapy

## 2018-02-17 ENCOUNTER — Telehealth (INDEPENDENT_AMBULATORY_CARE_PROVIDER_SITE_OTHER): Payer: Self-pay | Admitting: Physical Medicine and Rehabilitation

## 2018-02-17 VITALS — BP 153/63 | HR 73

## 2018-02-17 DIAGNOSIS — M6281 Muscle weakness (generalized): Secondary | ICD-10-CM

## 2018-02-17 DIAGNOSIS — M5441 Lumbago with sciatica, right side: Secondary | ICD-10-CM | POA: Diagnosis not present

## 2018-02-17 DIAGNOSIS — R2689 Other abnormalities of gait and mobility: Secondary | ICD-10-CM

## 2018-02-17 DIAGNOSIS — G8929 Other chronic pain: Secondary | ICD-10-CM | POA: Diagnosis not present

## 2018-02-17 DIAGNOSIS — R2681 Unsteadiness on feet: Secondary | ICD-10-CM

## 2018-02-17 DIAGNOSIS — M25662 Stiffness of left knee, not elsewhere classified: Secondary | ICD-10-CM

## 2018-02-17 DIAGNOSIS — R293 Abnormal posture: Secondary | ICD-10-CM

## 2018-02-17 NOTE — Telephone Encounter (Signed)
Patient states that his pain is mostly in his lower back. Scheduled for facet injections 03/08/18.

## 2018-02-17 NOTE — Telephone Encounter (Signed)
Injection looked good, could try facet if mostly back. Otherwise?

## 2018-02-17 NOTE — Therapy (Signed)
[facility] [address] [place], [place], [address] Phone: [phone]   Fax:  [phone]  Physical Therapy Treatment  Patient Details  Name: [name] MRN: [id number] Date of Birth: September 18, 1941 Referring Provider: Meredith Pel, MD    Encounter Date: 02/17/2018  PT End of Session - 02/17/18 1403    Visit Number  3    Number of Visits  17    Date for PT Re-Evaluation  04/06/18    PT Start Time  8938    PT Stop Time  1408    PT Time Calculation (min)  45 min    Activity Tolerance  Patient tolerated treatment well    Behavior During Therapy  [facility] for tasks assessed/performed;Impulsive;Restless       Past Medical History:  Diagnosis Date  . Allergy    takes Zyrtec daily  . Anxiety    takes Ativan daily as needed  . Arthritis   . Back pain   . BPH (benign prostatic hyperplasia)    takes doxazosin for  . 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 Depression    takes Wellbutrin daily  . Dyspnea on exertion    with exertion  . GERD (gastroesophageal reflux disease)    takes Nexium and Omeprazole daily  . History of kidney stones   . 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 Hyperlipidemia   . Neuromuscular disorder ([place])   . Sleep apnea   . Swelling of lower extremity    right more than leg leg  . Umbilical hernia     Past Surgical History:  Procedure Laterality Date  . 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 BACK SURGERY  2003   L4, L5  . BACK SURGERY    . ENDOVENOUS ABLATION SAPHENOUS VEIN W/ LASER Right 06-01-2014   EVLA right greater saphenous vein by Curt Jews MD   . epidural steroid injections        [facility]  . hernia repair  2003  . HERNIA REPAIR     hernia inguinal x 2  . TOTAL KNEE ARTHROPLASTY Left 03/22/2015   Procedure: TOTAL KNEE ARTHROPLASTY;  Surgeon: Meredith Pel, MD;  Location: [place];  Service: Orthopedics;   Laterality: Left;  . UMBILICAL HERNIA REPAIR N/A 06/27/2016   Procedure: LAPAROSCOPIC ASSISTED REPAIR OF UMBILICAL HERINA WITH MESH;  Surgeon: Johnathan Hausen, MD;  Location: [facility];  Service: General;  Laterality: N/A;  . VASCULAR SURGERY  2015   right leg    Vitals:   02/17/18 1323  BP: (!) 153/63  Pulse: 73    Subjective Assessment - 02/17/18 1323    Subjective  Pt reports he is feeling much better today. pt report he was working yesterday 4-5 hours standing  pain around 3.5 hours. patient founds that forward flexion helped reduce pain. pt reports he has been doing exercises to help reduce his pain. pt continues to have trouble with bed moblity due to collapsed mattris. pt reports he is back on melosicam on monday and reports mild pain reduction in feet     Pertinent History  MRI 08/07/2017)DDD(L2-L3 and L3-L4)moderate Spinal stenosis. L2-L3 neural foramina stenosis. severe spinal canal stenosis and bilateral neural foramina stenosis L4-L5. Hyperlipidemia, venous insufficiency, basal cell carcinoma(2017), spondylolisthesis, Achilles tendon contracture( bilateral).finger amputation, ( L3-4 back surgery, L TKA, Obesity, R knee OA    Limitations  Walking;Standing;House  hold activities    Patient Stated Goals  Pt would like to improve leg strength for work.    Currently in Pain?  Yes    Pain Score  0-No pain    Pain Score  2                       [facility] Adult PT Treatment/Exercise - 02/17/18 1315      Transfers   Transfers  Supine to Sit;Sit to Stand;Stand to Sit    Sit to Stand  6: Modified independent (Device/Increase time)    Stand to Sit  6: Modified independent (Device/Increase time)      Ambulation/Gait   Ambulation/Gait  Yes    Ambulation/Gait Assistance  6: Modified independent (Device/Increase time)    Ambulation Distance (Feet)  130 Feet    Assistive device  None    Gait Pattern  Left flexed knee in stance;Lateral trunk lean to left;Decreased weight shift to  right;Decreased trunk rotation;Trunk flexed;Poor foot clearance - left;Step-through pattern;Decreased step length - left;Decreased step length - right      Exercises   Other Exercises   Nustep 5 min level 3 and SCI fit level 2.0 5.5 min              PT Education - 02/17/18 1332    Education provided  Yes    Education Details  [employer] aquatic exercise, [employer] fitness classes, water shoes and wound prevention.  Nu step and Scifit set up and use. balanced diet     Person(s) Educated  Patient    Methods  Explanation;Demonstration    Comprehension  Verbalized understanding;Returned demonstration       PT Short Term Goals - 02/09/18 1710      PT SHORT TERM GOAL #1   Title  Patient will be Independent with initial HEP to facilitate progress during PT sessions(03/09/2018 target date for all goals)    Time  4    Period  Weeks    Status  New    Target Date  03/09/18      PT SHORT TERM GOAL #2   Title  Patient will demonstrate 546ft ambulation with LRAD Mod I(outdoors) with good foot clearance to improve muscular endurance     Time  4    Period  Weeks    Status  New    Target Date  03/09/18      PT SHORT TERM GOAL #3   Title  Pt will demonstrate stair negotiating 16 steps (total) with single railing, at supervision level in order to simulate home set and address muscular endurance    Time  4    Period  Weeks    Status  New    Target Date  03/09/18      PT SHORT TERM GOAL #4   Title  Pt will demonstrate or verbalize methods used to reduce acute pain flare ups     Time  4    Period  Weeks    Status  New    Target Date  03/09/18        PT Long Term Goals - 02/09/18 1712      PT LONG TERM GOAL #1   Title  Pt will demonstrate stair negotiation 24 steps (total) mod I with one hand rail  to simulate home environment and to address muscular endurance. (all LTGs target date 04/09/2018)    Time  8    Period  Weeks    Status  New    Target Date  04/09/18      PT LONG TERM GOAL #2    Title  Pt will ambulate 1095ft with LRAD at Mod I over grass and pavement in order to simulate home and work environment.     Time  8    Period  Weeks    Status  New    Target Date  04/09/18      PT LONG TERM GOAL #3   Title  Pt will demonstrate ability to pick up small objects from ankle, knee height (5-15#) with correct lifting mechanics simulating work related tasks    Time  8    Period  Weeks    Status  New    Target Date  04/09/18      PT LONG TERM GOAL #4   Title  Pt will be independent with ongoing HEP/Fitness program in order to maintain function & help control pain.     Time  8    Period  Weeks    Status  New    Target Date  04/09/18            Plan - 02/17/18 1431    Clinical Impression Statement  During today's PT session patient was educated set up and use of exercise equipment at the [facility]. Patient has been educated on aquatic fitness, per Pt request, at the [facility] and the benefits of aquatic exercises at the [facility]. During session patient completed short bouts of exercise on both the nustep and scifit steppers. Pt was eager to quickly increase resistance and duration in order to feel significant work. Patient also educated on benefits of increased activity level and how to safely progress duration followed by resistance in order to avoid excessive muscle soreness. Pt continues to be very impulsive and require maximal verbal cues to keep focus on the task at hand.     Rehab Potential  Good    Clinical Impairments Affecting Rehab Potential  good motivation    PT Frequency  2x / week    PT Duration  8 weeks    PT Treatment/Interventions  ADLs/Self Care Home Management;Biofeedback;Cryotherapy;Electrical Stimulation;Iontophoresis 4mg /ml Dexamethasone;Moist Heat;Traction;Ultrasound;Gait training;Stair training;Functional mobility training;Therapeutic activities;Therapeutic exercise;Balance training;Neuromuscular re-education;Patient/family education;Orthotic  Fit/Training;Compression bandaging;Passive range of motion;Energy conservation    PT Next Visit Plan  follow up on [employer] exercises and what he has done at the gym, work on stair trainin gand Production manager and Agree with Plan of Care  Patient       Patient will benefit from skilled therapeutic intervention in order to improve the following deficits and impairments:  Abnormal gait, Decreased skin integrity, Impaired sensation, Pain, Decreased mobility, Postural dysfunction, Decreased activity tolerance, Decreased range of motion, Decreased strength, Difficulty walking, Impaired flexibility, Obesity  Visit Diagnosis: Muscle weakness (generalized)  Other abnormalities of gait and mobility  Chronic bilateral low back pain with right-sided sciatica  Stiffness of left knee, not elsewhere classified  Unsteadiness on feet  Abnormal posture     Problem List Patient Active Problem List   Diagnosis Date Noted  . Spinal stenosis of lumbar region with neurogenic claudication 01/15/2017  . Spondylolisthesis of lumbar region 01/15/2017  . Claw toe, acquired, left 01/06/2017  . Achilles tendon contracture, bilateral 01/06/2017  . Pain in metatarsus of both feet 01/06/2017  . Tubular adenoma of colon 12/29/2016  . Basal cell carcinoma 08/19/2016  . Umbilical hernia s/p lap repair with mesh 06/27/2016 06/27/2016  . Degenerative  arthritis of knee 03/22/2015  . Dyspnea on exertion 01/31/2015  . Lumbar radiculopathy 12/12/2014  . Facet hypertrophy of lumbar region 12/12/2014  . Left knee pain 12/12/2014  . Ulcer of lower limb ([place]) 01/10/2014  . Varicose veins of lower extremities with other complications 50/72/2575  . 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 Renal insufficiency 12/21/2013  . Swelling in head/neck 07/28/2012  . Venous (peripheral) insufficiency 05/25/2012  . 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 Pain in limb 02/12/2012   . Hyperlipidemia 02/28/2008  . Allergic rhinitis 02/28/2008  . Sleep apnea 02/28/2008   Waunita Schooner SPT 02/17/2018, 2:34 PM  WALDRON,ROBIN PT, DPT 02/18/2018, 7:40 AM  [facility] [address] [place], [place], [address] Phone: [phone]   Fax:  [phone]  Name: ZAKARIE STURDIVANT MRN: [id number] Date of Birth: 01-Jul-1941

## 2018-02-23 ENCOUNTER — Encounter: Payer: Self-pay | Admitting: Physical Therapy

## 2018-02-23 ENCOUNTER — Ambulatory Visit: Payer: Medicare Other | Admitting: Physical Therapy

## 2018-02-23 DIAGNOSIS — M5441 Lumbago with sciatica, right side: Secondary | ICD-10-CM

## 2018-02-23 DIAGNOSIS — M6281 Muscle weakness (generalized): Secondary | ICD-10-CM | POA: Diagnosis not present

## 2018-02-23 DIAGNOSIS — G8929 Other chronic pain: Secondary | ICD-10-CM

## 2018-02-23 DIAGNOSIS — R2689 Other abnormalities of gait and mobility: Secondary | ICD-10-CM | POA: Diagnosis not present

## 2018-02-23 DIAGNOSIS — M25662 Stiffness of left knee, not elsewhere classified: Secondary | ICD-10-CM | POA: Diagnosis not present

## 2018-02-23 DIAGNOSIS — R293 Abnormal posture: Secondary | ICD-10-CM

## 2018-02-23 DIAGNOSIS — R2681 Unsteadiness on feet: Secondary | ICD-10-CM

## 2018-02-23 NOTE — Therapy (Signed)
[facility] [address] [place], [place], [address] Phone: [phone]   Fax:  [phone]  Physical Therapy Treatment  Patient Details  Name: [name] MRN: [id number] Date of Birth: June 04, 1941 Referring Provider: Meredith Pel, MD    Encounter Date: 02/23/2018  PT End of Session - 02/23/18 1109    Visit Number  4    Number of Visits  17    Date for PT Re-Evaluation  04/06/18    PT Start Time  1107    PT Stop Time  1145    PT Time Calculation (min)  38 min    Equipment Utilized During Treatment  Gait belt    Activity Tolerance  Patient tolerated treatment well    Behavior During Therapy  [facility] for tasks assessed/performed;Impulsive;Restless       Past Medical History:  Diagnosis Date  . Allergy    takes Zyrtec daily  . Anxiety    takes Ativan daily as needed  . Arthritis   . Back pain   . BPH (benign prostatic hyperplasia)    takes doxazosin for  . 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 Depression    takes Wellbutrin daily  . Dyspnea on exertion    with exertion  . GERD (gastroesophageal reflux disease)    takes Nexium and Omeprazole daily  . History of kidney stones   . 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 Hyperlipidemia   . Neuromuscular disorder ([place])   . Sleep apnea   . Swelling of lower extremity    right more than leg leg  . Umbilical hernia     Past Surgical History:  Procedure Laterality Date  . 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 BACK SURGERY  2003   L4, L5  . BACK SURGERY    . ENDOVENOUS ABLATION SAPHENOUS VEIN W/ LASER Right 06-01-2014   EVLA right greater saphenous vein by Curt Jews MD   . epidural steroid injections        [facility]  . hernia repair  2003  . HERNIA REPAIR     hernia inguinal x 2  . TOTAL KNEE ARTHROPLASTY Left 03/22/2015   Procedure: TOTAL KNEE ARTHROPLASTY;  Surgeon: Meredith Pel, MD;  Location: [place];  Service: Orthopedics;  Laterality: Left;  . UMBILICAL HERNIA REPAIR N/A 06/27/2016   Procedure: LAPAROSCOPIC ASSISTED REPAIR OF UMBILICAL HERINA WITH MESH;  Surgeon: Johnathan Hausen, MD;  Location[facility];  Service: General;  Laterality: N/A;  . VASCULAR SURGERY  2015   right leg    There were no vitals filed for this visit.  Subjective Assessment - 02/23/18 1112    Subjective  pt reports no falls since last PT session. patient reports that he has not yet gone to the [facility] due to increase in work. Pt reports he continues to get AM pain reduction during HEP stretching.     Pertinent History  MRI 08/07/2017)DDD(L2-L3 and L3-L4)moderate Spinal stenosis. L2-L3 neural foramina stenosis. severe spinal canal stenosis and bilateral neural foramina stenosis L4-L5. Hyperlipidemia, venous insufficiency, basal cell carcinoma(2017), spondylolisthesis, Achilles tendon contracture( bilateral).finger amputation, ( L3-4 back surgery, L TKA, Obesity, R knee OA    Limitations  Walking;Standing;House hold activities    Patient Stated Goals  Pt would like to improve leg strength for work.  [facility] Adult PT Treatment/Exercise - 02/23/18 1107      Transfers   Transfers  Supine to Sit;Sit to Stand;Stand to Sit    Stand to Sit  6: Modified independent (Device/Increase time)      Ambulation/Gait   Ambulation/Gait  Yes    Ambulation/Gait Assistance  6: Modified independent (Device/Increase time)    Ambulation Distance (Feet)  120 Feet    Assistive device  None    Gait Pattern  Left flexed knee in stance;Lateral trunk lean to left;Decreased weight shift to right;Decreased trunk rotation;Trunk flexed;Poor foot clearance - left;Step-through pattern;Decreased step length - left;Decreased step length - right    Ambulation Surface  Level;Indoor    Stairs  Yes    Stairs Assistance  4: Min guard Impulsive on stairs lead to minG for safety     Stair Management  Technique  One rail Right;One rail Left;No rails;Alternating pattern    Number of Stairs  4 6reps    Height of Stairs  6      Therapeutic Activites    Therapeutic Activities  --          Balance Exercises - 02/23/18 1107      Balance Exercises: Standing   Rockerboard  Anterior/posterior;Lateral;Head turns;EO;EC;5 reps;Intermittent UE support 5 reps each. Maximal verbal/tactile cues , in //bars minG         PT Education - 02/23/18 1109    Education provided  Yes    Education Details  compression socks, sock replacements don and doff compression socks , [employer] fitness,    Person(s) Educated  Patient    Methods  Explanation    Comprehension  Verbalized understanding       PT Short Term Goals - 02/09/18 1710      PT SHORT TERM GOAL #1   Title  Patient will be Independent with initial HEP to facilitate progress during PT sessions(03/09/2018 target date for all goals)    Time  4    Period  Weeks    Status  New    Target Date  03/09/18      PT SHORT TERM GOAL #2   Title  Patient will demonstrate 586ft ambulation with LRAD Mod I(outdoors) with good foot clearance to improve muscular endurance     Time  4    Period  Weeks    Status  New    Target Date  03/09/18      PT SHORT TERM GOAL #3   Title  Pt will demonstrate stair negotiating 16 steps (total) with single railing, at supervision level in order to simulate home set and address muscular endurance    Time  4    Period  Weeks    Status  New    Target Date  03/09/18      PT SHORT TERM GOAL #4   Title  Pt will demonstrate or verbalize methods used to reduce acute pain flare ups     Time  4    Period  Weeks    Status  New    Target Date  03/09/18        PT Long Term Goals - 02/09/18 1712      PT LONG TERM GOAL #1   Title  Pt will demonstrate stair negotiation 24 steps (total) mod I with one hand rail  to simulate home environment and to address muscular endurance. (all LTGs target date 04/09/2018)    Time  8     Period  Weeks  Status  New    Target Date  04/09/18      PT LONG TERM GOAL #2   Title  Pt will ambulate 1051ft with LRAD at Mod I over grass and pavement in order to simulate home and work environment.     Time  8    Period  Weeks    Status  New    Target Date  04/09/18      PT LONG TERM GOAL #3   Title  Pt will demonstrate ability to pick up small objects from ankle, knee height (5-15#) with correct lifting mechanics simulating work related tasks    Time  8    Period  Weeks    Status  New    Target Date  04/09/18      PT LONG TERM GOAL #4   Title  Pt will be independent with ongoing HEP/Fitness program in order to maintain function & help control pain.     Time  8    Period  Weeks    Status  New    Target Date  04/09/18            Plan - 02/23/18 1155    Clinical Impression Statement  During todays PT session patient continues to require maximal verbal cues to remain on task during PT sessions. During session patient continues to be impulsive during PT session requiring min G during stair training and balance training. Patient continues to make progress with lumbar pain reduction in the AM with use of his HEP stretches. Based on subjective information of increasing static and dynamic balance when he is holding on to something PT suggested he use a AD( cane) to improve balance in open areas. Pt is reluctant to the Idea of using any types of AD in the community.     Rehab Potential  Good    Clinical Impairments Affecting Rehab Potential  good motivation    PT Frequency  2x / week    PT Duration  8 weeks    PT Treatment/Interventions  ADLs/Self Care Home Management;Biofeedback;Cryotherapy;Electrical Stimulation;Iontophoresis 4mg /ml Dexamethasone;Moist Heat;Traction;Ultrasound;Gait training;Stair training;Functional mobility training;Therapeutic activities;Therapeutic exercise;Balance training;Neuromuscular re-education;Patient/family education;Orthotic Fit/Training;Compression  bandaging;Passive range of motion;Energy conservation    PT Next Visit Plan  follow up on [employer] exercises and what he has done at the gym, work on stair training and Engineer, production  as well as balance training. ( rocker board and stepping pattern)    Consulted and Agree with Plan of Care  Patient       Patient will benefit from skilled therapeutic intervention in order to improve the following deficits and impairments:  Abnormal gait, Decreased skin integrity, Impaired sensation, Pain, Decreased mobility, Postural dysfunction, Decreased activity tolerance, Decreased range of motion, Decreased strength, Difficulty walking, Impaired flexibility, Obesity  Visit Diagnosis: Muscle weakness (generalized)  Other abnormalities of gait and mobility  Chronic bilateral low back pain with right-sided sciatica  Stiffness of left knee, not elsewhere classified  Unsteadiness on feet  Abnormal posture     Problem List Patient Active Problem List   Diagnosis Date Noted  . Spinal stenosis of lumbar region with neurogenic claudication 01/15/2017  . Spondylolisthesis of lumbar region 01/15/2017  . Claw toe, acquired, left 01/06/2017  . Achilles tendon contracture, bilateral 01/06/2017  . Pain in metatarsus of both feet 01/06/2017  . Tubular adenoma of colon 12/29/2016  . Basal cell carcinoma 08/19/2016  . Umbilical hernia s/p lap repair with mesh 06/27/2016 06/27/2016  . Degenerative arthritis  of knee 03/22/2015  . Dyspnea on exertion 01/31/2015  . Lumbar radiculopathy 12/12/2014  . Facet hypertrophy of lumbar region 12/12/2014  . Left knee pain 12/12/2014  . Ulcer of lower limb ([place]) 01/10/2014  . Varicose veins of lower extremities with other complications 76/81/1572  . 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 Renal insufficiency 12/21/2013  . Swelling in head/neck 07/28/2012  . Venous (peripheral) insufficiency 05/25/2012  . Hypogonadism male  03/16/2012  . Pain in limb 02/12/2012  . Hyperlipidemia 02/28/2008  . Allergic rhinitis 02/28/2008  . Sleep apnea 02/28/2008    Waunita Schooner SPT 02/23/2018, 11:56 AM  [facility] [address] [place], [place], [address] Phone: [phone]   Fax:  [phone]  Name: [name] MRN: [id number] Date of Birth: 12-15-1940

## 2018-02-24 ENCOUNTER — Ambulatory Visit (INDEPENDENT_AMBULATORY_CARE_PROVIDER_SITE_OTHER): Payer: Medicare Other | Admitting: Physical Medicine and Rehabilitation

## 2018-02-24 ENCOUNTER — Ambulatory Visit (INDEPENDENT_AMBULATORY_CARE_PROVIDER_SITE_OTHER): Payer: Medicare Other

## 2018-02-24 ENCOUNTER — Encounter (INDEPENDENT_AMBULATORY_CARE_PROVIDER_SITE_OTHER): Payer: Self-pay | Admitting: Physical Medicine and Rehabilitation

## 2018-02-24 VITALS — BP 163/96 | HR 61 | Temp 97.6°F

## 2018-02-24 DIAGNOSIS — M47816 Spondylosis without myelopathy or radiculopathy, lumbar region: Secondary | ICD-10-CM

## 2018-02-24 DIAGNOSIS — M545 Low back pain: Secondary | ICD-10-CM

## 2018-02-24 DIAGNOSIS — G8929 Other chronic pain: Secondary | ICD-10-CM

## 2018-02-24 MED ORDER — METHYLPREDNISOLONE ACETATE 80 MG/ML IJ SUSP
80.0000 mg | Freq: Once | INTRAMUSCULAR | Status: AC
  Administered 2018-02-24: 80 mg

## 2018-02-24 NOTE — Progress Notes (Signed)
 .  Numeric Pain Rating Scale and Functional Assessment Average Pain 8   In the last MONTH (on 0-10 scale) has pain interfered with the following?  1. General activity like being  able to carry out your everyday physical activities such as walking, climbing stairs, carrying groceries, or moving a chair?  Rating(7)   +Driver, -BT, -Dye Allergies.  

## 2018-02-24 NOTE — Patient Instructions (Signed)

## 2018-02-25 ENCOUNTER — Ambulatory Visit: Payer: Medicare Other | Admitting: Physical Therapy

## 2018-03-01 ENCOUNTER — Ambulatory Visit: Payer: Medicare Other | Admitting: Physical Therapy

## 2018-03-05 ENCOUNTER — Encounter: Payer: Self-pay | Admitting: Physical Therapy

## 2018-03-05 ENCOUNTER — Ambulatory Visit: Payer: Medicare Other | Admitting: Physical Therapy

## 2018-03-05 DIAGNOSIS — M5441 Lumbago with sciatica, right side: Secondary | ICD-10-CM

## 2018-03-05 DIAGNOSIS — R2689 Other abnormalities of gait and mobility: Secondary | ICD-10-CM

## 2018-03-05 DIAGNOSIS — R293 Abnormal posture: Secondary | ICD-10-CM

## 2018-03-05 DIAGNOSIS — M6281 Muscle weakness (generalized): Secondary | ICD-10-CM | POA: Diagnosis not present

## 2018-03-05 DIAGNOSIS — M25662 Stiffness of left knee, not elsewhere classified: Secondary | ICD-10-CM | POA: Diagnosis not present

## 2018-03-05 DIAGNOSIS — R2681 Unsteadiness on feet: Secondary | ICD-10-CM | POA: Diagnosis not present

## 2018-03-05 DIAGNOSIS — G8929 Other chronic pain: Secondary | ICD-10-CM | POA: Diagnosis not present

## 2018-03-05 NOTE — Procedures (Signed)
Lumbar Facet Joint Intra-Articular Injection(s) with Fluoroscopic Guidance  Patient: [name]      Date of Birth: 07-27-1941 MRN: [id number] PCP: Patient, No Pcp Per      Visit Date: 02/24/2018   Universal Protocol:    Date/Time: 02/24/2018  Consent Given By: the patient  Position: PRONE   Additional Comments: Vital signs were monitored before and after the procedure. Patient was prepped and draped in the usual sterile fashion. The correct patient, procedure, and site was verified.   Injection Procedure Details:  Procedure Site One Meds Administered:  Meds ordered this encounter  Medications  . methylPREDNISolone acetate (DEPO-MEDROL) injection 80 mg     Laterality: Bilateral  Location/Site:  L4-L5  Needle size: 22 guage  Needle type: Spinal  Needle Placement: Articular  Findings:  -Comments: Excellent flow of contrast producing a partial arthrogram.  Procedure Details: The fluoroscope beam is vertically oriented in AP, and the inferior recess is visualized beneath the lower pole of the inferior apophyseal process, which represents the target point for needle insertion. When direct visualization is difficult the target point is located at the medial projection of the vertebral pedicle. The region overlying each aforementioned target is locally anesthetized with a 1 to 2 ml. volume of 1% Lidocaine without Epinephrine.   The spinal needle was inserted into each of the above mentioned facet joints using biplanar fluoroscopic guidance. A 0.25 to 0.5 ml. volume of Isovue-250 was injected and a partial facet joint arthrogram was obtained. A single spot film was obtained of the resulting arthrogram.    One to 1.25 ml of the steroid/anesthetic solution was then injected into each of the facet joints noted above.   Additional Comments:  The patient tolerated the procedure well Dressing: Band-Aid    Post-procedure details: Patient was observed during the  procedure. Post-procedure instructions were reviewed.  Patient left the clinic in stable condition.

## 2018-03-05 NOTE — Progress Notes (Signed)
AUDRIC VENN - 77 y.o. male MRN [id number]  Date of birth: 1941-01-27  Office Visit Note: Visit Date: 02/24/2018 PCP: Patient, No Pcp Per Referred by: No ref. provider found  Subjective: Chief Complaint  Patient presents with  . Middle Back - Pain   HPI: Mr. Gendron is a 77 year old gentleman who is followed by most of the orthopedic surgeons in our office and I have been seeing him on few occasions over the last year or so for his low back and radicular leg pain.  He is also been evaluated by Dr. Louanne Skye in terms of spine surgery evaluation.  Patient has severe facet arthropathy with listhesis of L4 on L5 with fairly severe stenosis multifactorial.  Patient returns after having had bilateral L4 transforaminal injection completed a few weeks ago with no relief.  Review of fluoroscopic images shows well-placed epidural injection.  Without getting any relief at all from the injection and those injections being that well-placed I will try facet joint blocks see if that helps his back pain.  We may be a point of diminishing returns with his stenosis.   ROS Otherwise per HPI.  Assessment & Plan: Visit Diagnoses:  1. Spondylosis without myelopathy or radiculopathy, lumbar region   2. Chronic bilateral low back pain without sciatica     Plan: No additional findings.   Meds & Orders:  Meds ordered this encounter  Medications  . methylPREDNISolone acetate (DEPO-MEDROL) injection 80 mg    Orders Placed This Encounter  Procedures  . Facet Injection  . XR C-ARM NO REPORT    Follow-up: Return if symptoms worsen or fail to improve.   Procedures: No procedures performed  No notes on file   Clinical History: Lumbar Spine MRI 2014 L3-4:  A diffuse bulging annulus, short pedicles and facet disease contribute to moderate spinal and bilateral lateral recess stenosis and mild bilateral foraminal stenosis.  This appears relatively stable.  L4-5:  Degenerative anterolisthesis of L4 with a  bulging uncovered disc, short pedicles and severe facet disease contributing to moderately severe spinal and bilateral lateral recess stenosis and moderately severe bilateral foraminal stenosis.  These findings relatively stable.  L5-S1:  Persistent focal central disc protrusion and severe facet disease contributing to spinal and lateral recess stenosis with persistent mass effect on the right S1 nerve root.  No significant foraminal stenosis.  IMPRESSION: Severe degenerative lumbar spondylosis with multilevel multifactorial spinal, lateral recess and foraminal stenosis as specifically discussed above at the individual levels.  No significant interval change when compared the prior study.  Cervical MRI 08/30/2016 IMPRESSION: Mild progressive degenerative changes when compared to prior exam. Summary of pertinent findings includes:  C1-2 degenerative changes with small amount of fluid right C1-2 lateral mass articulation.  C2-3 no significant spinal stenosis or foraminal narrowing.  C3-4 multifactorial mild to moderate foraminal narrowing greater on the left. Vertebral arteries extends into neural foramen. Spur posterior superior aspect of the C4 vertebra greatest left paracentral position. Mild indentation ventral thecal sac and minimal left-sided cord contact.  C4-5 broad-based disc osteophyte complex greater to the right. Spinal stenosis and cord flattening greater on the right. Multifactorial marked bilateral foraminal narrowing greater on the right.  C5-6 broad-based disc osteophyte complex greater to the right. Mild right-sided cord flattening. Multifactorial mild to moderate right foraminal narrowing.  C6-7 broad-based disc osteophyte complex slightly greater to left. Ventral thecal sac narrowing with minimal left-sided cord contact. Multifactorial marked bilateral foraminal narrowing.  C7-T1 bulge and spur with narrowing ventral  thecal sac. Moderate bilateral  foraminal narrowing.   He reports that he has never smoked. He has never used smokeless tobacco.  Recent Labs    01/15/18 1432  LABURIC 7.0    Objective:  VS:  HT:    WT:   BMI:     BP:(!) 163/96  HR:61bpm  TEMP:97.6 F (36.4 C)(Oral)  RESP:97 % Physical Exam  Ortho Exam Imaging: No results found.  Past Medical/Family/Surgical/Social History: Medications & Allergies reviewed per EMR, new medications updated. Patient Active Problem List   Diagnosis Date Noted  . Spinal stenosis of lumbar region with neurogenic claudication 01/15/2017  . Spondylolisthesis of lumbar region 01/15/2017  . Claw toe, acquired, left 01/06/2017  . Achilles tendon contracture, bilateral 01/06/2017  . Pain in metatarsus of both feet 01/06/2017  . Tubular adenoma of colon 12/29/2016  . Basal cell carcinoma 08/19/2016  . Umbilical hernia s/p lap repair with mesh 06/27/2016 06/27/2016  . Degenerative arthritis of knee 03/22/2015  . Dyspnea on exertion 01/31/2015  . Lumbar radiculopathy 12/12/2014  . Facet hypertrophy of lumbar region 12/12/2014  . Left knee pain 12/12/2014  . Ulcer of lower limb ([place]) 01/10/2014  . Varicose veins of lower extremities with other complications 16/08/9603  . 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 Renal insufficiency 12/21/2013  . Swelling in head/neck 07/28/2012  . Venous (peripheral) insufficiency 05/25/2012  . 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 Pain in limb 02/12/2012  . Hyperlipidemia 02/28/2008  . Allergic rhinitis 02/28/2008  . Sleep apnea 02/28/2008   Past Medical History:  Diagnosis Date  . Allergy    takes Zyrtec daily  . Anxiety    takes Ativan daily as needed  . Arthritis   . Back pain   . BPH (benign prostatic hyperplasia)    takes doxazosin for  . 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 Depression    takes Wellbutrin daily  . Dyspnea on exertion    with exertion  . GERD (gastroesophageal  reflux disease)    takes Nexium and Omeprazole daily  . History of kidney stones   . Hx of [facility] spotted fever childhood  . Hyperlipidemia   . Neuromuscular disorder ([place])   . Sleep apnea   . Swelling of lower extremity    right more than leg leg  . Umbilical hernia    Family History  Problem Relation Age of Onset  . Thyroid disease Mother   . Heart disease Father   . Diabetes Maternal Grandfather    Past Surgical History:  Procedure Laterality Date  . 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 BACK SURGERY  2003   L4, L5  . BACK SURGERY    . ENDOVENOUS ABLATION SAPHENOUS VEIN W/ LASER Right 06-01-2014   EVLA right greater saphenous vein by Curt Jews MD   . epidural steroid injections        [facility] dr Cletis Clack  . hernia repair  2003  . HERNIA REPAIR     hernia inguinal x 2  . TOTAL KNEE ARTHROPLASTY Left 03/22/2015   Procedure: TOTAL KNEE ARTHROPLASTY;  Surgeon: Meredith Pel, MD;  Location: [facility];  Service: Orthopedics;  Laterality: Left;  . UMBILICAL HERNIA REPAIR N/A 06/27/2016   Procedure: LAPAROSCOPIC ASSISTED REPAIR OF UMBILICAL HERINA WITH MESH;  Surgeon: Johnathan Hausen, MD;  Location: [facility];  Service: General;  Laterality: N/A;  . VASCULAR SURGERY  2015  right leg   Social History   Occupational History  . 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 Smoking status: Never Smoker  . Smokeless tobacco: Never Used  Substance and Sexual Activity  . Alcohol use: No  . Drug use: No  . Sexual activity: Yes    Birth control/protection: Condom

## 2018-03-06 NOTE — Therapy (Signed)
[facility] [address] [place], [place], [address] Phone: [phone]   Fax:  [phone]  Physical Therapy Treatment  Patient Details  Name: [name] MRN: [id number] Date of Birth: 1941/11/01 Referring Provider: Meredith Pel, MD    Encounter Date: 03/05/2018  PT End of Session - 03/05/18 1657    Visit Number  5    Number of Visits  17    Date for PT Re-Evaluation  04/06/18    PT Start Time  5643    PT Stop Time  1400    PT Time Calculation (min)  32 min    Equipment Utilized During Treatment  Gait belt    Activity Tolerance  Patient tolerated treatment well    Behavior During Therapy  [facility] for tasks assessed/performed       Past Medical History:  Diagnosis Date  . Allergy    takes Zyrtec daily  . Anxiety    takes Ativan daily as needed  . Arthritis   . Back pain   . BPH (benign prostatic hyperplasia)    takes doxazosin for  . 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 Depression    takes Wellbutrin daily  . Dyspnea on exertion    with exertion  . GERD (gastroesophageal reflux disease)    takes Nexium and Omeprazole daily  . History of kidney stones   . Hx of [facility] spotted fever childhood  . Hyperlipidemia   . Neuromuscular disorder ([place])   . Sleep apnea   . Swelling of lower extremity    right more than leg leg  . Umbilical hernia     Past Surgical History:  Procedure Laterality Date  . 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 BACK SURGERY  2003   L4, L5  . BACK SURGERY    . ENDOVENOUS ABLATION SAPHENOUS VEIN W/ LASER Right 06-01-2014   EVLA right greater saphenous vein by Curt Jews MD   . epidural steroid injections        [facility] dr [place]  . hernia repair  2003  . HERNIA REPAIR     hernia inguinal x 2  . TOTAL KNEE ARTHROPLASTY Left 03/22/2015   Procedure: TOTAL KNEE ARTHROPLASTY;  Surgeon: Meredith Pel, MD;  Location:  [facility];  Service: Orthopedics;  Laterality: Left;  . UMBILICAL HERNIA REPAIR N/A 06/27/2016   Procedure: LAPAROSCOPIC ASSISTED REPAIR OF UMBILICAL HERINA WITH MESH;  Surgeon: Johnathan Hausen, MD;  Location: [facility];  Service: General;  Laterality: N/A;  . VASCULAR SURGERY  2015   right leg    There were no vitals filed for this visit.  Subjective Assessment - 03/05/18 1329    Subjective  No falls.  He had shot into facets L4-5 on 02/24/2018 and thinks it may help some. But his activities vary so much it is hard to tell.  (Pended)     Pertinent History  MRI 08/07/2017)DDD(L2-L3 and L3-L4)moderate Spinal stenosis. L2-L3 neural foramina stenosis. severe spinal canal stenosis and bilateral neural foramina stenosis L4-L5. Hyperlipidemia, venous insufficiency, basal cell carcinoma(2017), spondylolisthesis, Achilles tendon contracture( bilateral).finger amputation, ( L3-4 back surgery, L TKA, Obesity, R knee OA    Limitations  Walking;Standing;House hold activities    Patient Stated Goals  Pt would like to improve leg strength for work.    Currently in Pain?  Yes  (Pended)     Pain Score  2   (  Pended)     Pain Location  Back  (Pended)     Pain Orientation  Lower;Right  (Pended)     Pain Descriptors / Indicators  Aching  (Pended)     Pain Type  Chronic pain  (Pended)     Pain Onset  More than a month ago  (Pended)     Pain Frequency  Constant  (Pended)     Aggravating Factors   standing upright  (Pended)     Pain Relieving Factors  medications & elevatoin of feet  (Pended)        Therapeutic Exercise: Seated stretches - trunk rotation, hamstring stretch and pelvic rocking lumbar extension/flexion. Standing stretches - at counter for support back extension, trunk rotation, standing on stair gastroc stretch,  All stretches held for 2 -3 deep breathes.                         PT Education - 03/05/18 1345    Education provided  Yes    Education Details  elevation of LEs above  heart with use of pillows under LEs on bed or couch, ankle exercises of DF/PF, circles or A-Z,  stretches in sitting & standing    Person(s) Educated  Patient    Methods  Explanation;Demonstration;Verbal cues    Comprehension  Verbalized understanding;Returned demonstration;Need further instruction       PT Short Term Goals - 03/05/18 1659      PT SHORT TERM GOAL #1   Title  Patient will be Independent with initial HEP to facilitate progress during PT sessions(03/09/2018 target date for all goals)    Baseline  MET 03/05/2018    Time  4    Period  Weeks    Status  Achieved      PT SHORT TERM GOAL #2   Title  Patient will demonstrate 56f ambulation with LRAD Mod I(outdoors) with good foot clearance to improve muscular endurance     Time  4    Period  Weeks    Status  On-going    Target Date  03/09/18      PT SHORT TERM GOAL #3   Title  Pt will demonstrate stair negotiating 16 steps (total) with single railing, at supervision level in order to simulate home set and address muscular endurance    Baseline  MET 03/05/2018    Time  4    Period  Weeks    Status  Achieved      PT SHORT TERM GOAL #4   Title  Pt will demonstrate or verbalize methods used to reduce acute pain flare ups     Baseline  MET 03/05/2018    Time  4    Period  Weeks    Status  Achieved        PT Long Term Goals - 03/05/18 1700      PT LONG TERM GOAL #1   Title  Pt will demonstrate stair negotiation 24 steps (total) mod I with one hand rail  to simulate home environment and to address muscular endurance. (all LTGs target date 04/09/2018)    Time  8    Period  Weeks    Status  On-going    Target Date  04/09/18      PT LONG TERM GOAL #2   Title  Pt will ambulate 10010fwith LRAD at Mod I over grass and pavement in order to simulate home and work environment.  Time  8    Period  Weeks    Status  On-going    Target Date  04/09/18      PT LONG TERM GOAL #3   Title  Pt will demonstrate ability to pick  up small objects from ankle, knee height (5-15#) with correct lifting mechanics simulating work related tasks    Time  8    Period  Weeks    Status  On-going    Target Date  04/09/18      PT LONG TERM GOAL #4   Title  Pt will be independent with ongoing HEP/Fitness program in order to maintain function & help control pain.     Time  8    Period  Weeks    Status  On-going    Target Date  04/09/18            Plan - 03/05/18 1701    Clinical Impression Statement  Patient met 3 STGs checked. Plans to check remaining gait STG next session. Pt verbalizes understanding of stretches that he can perform in sitting or standing to relief his back or change positions/ go for a short walk.     Rehab Potential  Good    Clinical Impairments Affecting Rehab Potential  good motivation    PT Frequency  2x / week    PT Duration  8 weeks    PT Treatment/Interventions  ADLs/Self Care Home Management;Biofeedback;Cryotherapy;Electrical Stimulation;Iontophoresis '4mg'$ /ml Dexamethasone;Moist Heat;Traction;Ultrasound;Gait training;Stair training;Functional mobility training;Therapeutic activities;Therapeutic exercise;Balance training;Neuromuscular re-education;Patient/family education;Orthotic Fit/Training;Compression bandaging;Passive range of motion;Energy conservation    PT Next Visit Plan  check remaining STG gait, follow up on [employer] exercises and what he has done at the gym    Consulted and Agree with Plan of Care  Patient       Patient will benefit from skilled therapeutic intervention in order to improve the following deficits and impairments:  Abnormal gait, Decreased skin integrity, Impaired sensation, Pain, Decreased mobility, Postural dysfunction, Decreased activity tolerance, Decreased range of motion, Decreased strength, Difficulty walking, Impaired flexibility, Obesity  Visit Diagnosis: Muscle weakness (generalized)  Other abnormalities of gait and mobility  Chronic bilateral low back pain  with right-sided sciatica  Unsteadiness on feet  Abnormal posture     Problem List Patient Active Problem List   Diagnosis Date Noted  . Spinal stenosis of lumbar region with neurogenic claudication 01/15/2017  . Spondylolisthesis of lumbar region 01/15/2017  . Claw toe, acquired, left 01/06/2017  . Achilles tendon contracture, bilateral 01/06/2017  . Pain in metatarsus of both feet 01/06/2017  . Tubular adenoma of colon 12/29/2016  . Basal cell carcinoma 08/19/2016  . Umbilical hernia s/p lap repair with mesh 06/27/2016 06/27/2016  . Degenerative arthritis of knee 03/22/2015  . Dyspnea on exertion 01/31/2015  . Lumbar radiculopathy 12/12/2014  . Facet hypertrophy of lumbar region 12/12/2014  . Left knee pain 12/12/2014  . Ulcer of lower limb ([place]) 01/10/2014  . Varicose veins of lower extremities with other complications 09/98/3382  . 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 Renal insufficiency 12/21/2013  . Swelling in head/neck 07/28/2012  . Venous (peripheral) insufficiency 05/25/2012  . 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 Pain in limb 02/12/2012  . Hyperlipidemia 02/28/2008  . Allergic rhinitis 02/28/2008  . Sleep apnea 02/28/2008    Jamey Reas PT, DPT 03/06/2018, 5:08 PM  [facility] [address] [place], [place], [address] Phone: [phone]   Fax:  [phone]  Name: [name]  [name] MRN: [id number] Date of Birth: 04/07/1941

## 2018-03-08 ENCOUNTER — Encounter (INDEPENDENT_AMBULATORY_CARE_PROVIDER_SITE_OTHER): Payer: Medicare Other | Admitting: Physical Medicine and Rehabilitation

## 2018-03-08 ENCOUNTER — Ambulatory Visit: Payer: Medicare Other | Admitting: Physical Therapy

## 2018-03-08 ENCOUNTER — Encounter: Payer: Self-pay | Admitting: Physical Therapy

## 2018-03-08 DIAGNOSIS — G8929 Other chronic pain: Secondary | ICD-10-CM | POA: Diagnosis not present

## 2018-03-08 DIAGNOSIS — M6281 Muscle weakness (generalized): Secondary | ICD-10-CM

## 2018-03-08 DIAGNOSIS — R293 Abnormal posture: Secondary | ICD-10-CM

## 2018-03-08 DIAGNOSIS — R2681 Unsteadiness on feet: Secondary | ICD-10-CM | POA: Diagnosis not present

## 2018-03-08 DIAGNOSIS — R2689 Other abnormalities of gait and mobility: Secondary | ICD-10-CM | POA: Diagnosis not present

## 2018-03-08 DIAGNOSIS — M25662 Stiffness of left knee, not elsewhere classified: Secondary | ICD-10-CM | POA: Diagnosis not present

## 2018-03-08 DIAGNOSIS — M5441 Lumbago with sciatica, right side: Secondary | ICD-10-CM

## 2018-03-08 NOTE — Patient Instructions (Signed)
Habituation - Tip Card  1.The goal of habituation training is to assist in decreasing symptoms of vertigo, dizziness, or nausea provoked by specific head and body motions. 2.These exercises may initially increase symptoms; however, be persistent and work through symptoms. With repetition and time, the exercises will assist in reducing or eliminating symptoms. 3.Exercises should be stopped and discussed with the therapist if you experience any of the following: - Sudden change or fluctuation in hearing - New onset of ringing in the ears, or increase in current intensity - Any fluid discharge from the ear - Severe pain in neck or back - Extreme nausea  Copyright  VHI. All rights reserved.   Habituation - Sit to Side-Lying   Sit on edge of bed. Lie down onto the right side and hold until dizziness stops, plus 20 seconds.  Return to sitting and wait until dizziness stops, plus 20 seconds.  Repeat to the left side. Repeat sequence 5 times per session. Do 2 sessions per day.  Copyright  VHI. All rights reserved.     

## 2018-03-09 NOTE — Therapy (Signed)
[facility] [address] [place], [place], [address] Phone: [phone]   Fax:  [phone]  Physical Therapy Treatment  Patient Details  Name: [name] MRN: [id number] Date of Birth: 08/18/1941 Referring Provider: Meredith Pel, MD    Encounter Date: 03/08/2018  PT End of Session - 03/08/18 1537    Visit Number  6    Number of Visits  17    Date for PT Re-Evaluation  04/06/18    PT Start Time  3546    PT Stop Time  1338    PT Time Calculation (min)  40 min    Activity Tolerance  Patient tolerated treatment well    Behavior During Therapy  [facility] for tasks assessed/performed       Past Medical History:  Diagnosis Date  . Allergy    takes Zyrtec daily  . Anxiety    takes Ativan daily as needed  . Arthritis   . Back pain   . BPH (benign prostatic hyperplasia)    takes doxazosin for  . 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 Depression    takes Wellbutrin daily  . Dyspnea on exertion    with exertion  . GERD (gastroesophageal reflux disease)    takes Nexium and Omeprazole daily  . History of kidney stones   . 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 Hyperlipidemia   . Neuromuscular disorder ([place])   . Sleep apnea   . Swelling of lower extremity    right more than leg leg  . Umbilical hernia     Past Surgical History:  Procedure Laterality Date  . 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 BACK SURGERY  2003   L4, L5  . BACK SURGERY    . ENDOVENOUS ABLATION SAPHENOUS VEIN W/ LASER Right 06-01-2014   EVLA right greater saphenous vein by Curt Jews MD   . epidural steroid injections        [facility]  . hernia repair  2003  . HERNIA REPAIR     hernia inguinal x 2  . TOTAL KNEE ARTHROPLASTY Left 03/22/2015   Procedure: TOTAL KNEE ARTHROPLASTY;  Surgeon: Meredith Pel, MD;  Location: [facility];  Service: Orthopedics;  Laterality: Left;  .  UMBILICAL HERNIA REPAIR N/A 06/27/2016   Procedure: LAPAROSCOPIC ASSISTED REPAIR OF UMBILICAL HERINA WITH MESH;  Surgeon: Johnathan Hausen, MD;  Location: [facility];  Service: General;  Laterality: N/A;  . VASCULAR SURGERY  2015   right leg    There were no vitals filed for this visit.  Subjective Assessment - 03/08/18 1300    Subjective  He has been dizzy over the last 2-3 days. He feels like his BP is low.     Pertinent History  MRI 08/07/2017)DDD(L2-L3 and L3-L4)moderate Spinal stenosis. L2-L3 neural foramina stenosis. severe spinal canal stenosis and bilateral neural foramina stenosis L4-L5. Hyperlipidemia, venous insufficiency, basal cell carcinoma(2017), spondylolisthesis, Achilles tendon contracture( bilateral).finger amputation, ( L3-4 back surgery, L TKA, Obesity, R knee OA    Limitations  Walking;Standing;House hold activities    Patient Stated Goals  Pt would like to improve leg strength for work.    Currently in Pain?  No/denies             Vestibular Assessment - 03/08/18 1300      Positional Testing   Dix-Hallpike  Dix-Hallpike Right;Dix-Hallpike Left  Sidelying Test  Sidelying Right;Sidelying Left    Horizontal Canal Testing  Horizontal Canal Right;Horizontal Canal Left      Dix-Hallpike Right   Dix-Hallpike Right Duration  none    Dix-Hallpike Right Symptoms  No nystagmus unable to get 30 degrees neck extension *tight c-spine*      Dix-Hallpike Left   Dix-Hallpike Left Duration  none    Dix-Hallpike Left Symptoms  No nystagmus only able to achieve neutral c-spine/ no extension      Sidelying Right   Sidelying Right Duration  no dizziness increase, no nystagmus right sidelying to sitting dizziness no nystagmus    Sidelying Right Symptoms  No nystagmus      Sidelying Left   Sidelying Left Duration  up beat for 10 seconds,     Sidelying Left Symptoms  Upbeat Nystagmus      Horizontal Canal Right   Horizontal Canal Right Duration  none    Horizontal Canal Right  Symptoms  Normal      Horizontal Canal Left   Horizontal Canal Left Duration  none    Horizontal Canal Left Symptoms  Normal      Orthostatics   BP supine (x 5 minutes)  155/68 back of eyes feel funny but not dizzy    HR supine (x 5 minutes)  63    BP sitting  145/74 dizziness  6/10, light headed    HR sitting  69    BP standing (after 1 minute)  147/80 dizzy 4/10, unstable several shuffling steps to stabilize    HR standing (after 1 minute)  73    BP standing (after 3 minutes)  162/89 dizziness 1/10 cloudy feeling,     HR standing (after 3 minutes)  75                Vestibular Treatment/Exercise - 03/08/18 1337      Vestibular Treatment/Exercise   Vestibular Treatment Provided  Habituation    Habituation Exercises  [name] Daroff   Number of Reps   2    Symptom Description   Symptoms have improved with repetition.  PT provided for HEP.             PT Education - 03/08/18 1339    Education provided  Yes    Education Details  HEP: habituation brandt daroff     Person(s) Educated  Patient    Methods  Explanation;Demonstration;Handout    Comprehension  Verbalized understanding;Returned demonstration       PT Short Term Goals - 03/05/18 1659      PT SHORT TERM GOAL #1   Title  Patient will be Independent with initial HEP to facilitate progress during PT sessions(03/09/2018 target date for all goals)    Baseline  MET 03/05/2018    Time  4    Period  Weeks    Status  Achieved      PT SHORT TERM GOAL #2   Title  Patient will demonstrate 542f ambulation with LRAD Mod I(outdoors) with good foot clearance to improve muscular endurance     Time  4    Period  Weeks    Status  On-going    Target Date  03/09/18      PT SHORT TERM GOAL #3   Title  Pt will demonstrate stair negotiating 16 steps (total) with single railing, at supervision level in order to simulate home set and address muscular endurance    Baseline  MET  03/05/2018    Time  4     Period  Weeks    Status  Achieved      PT SHORT TERM GOAL #4   Title  Pt will demonstrate or verbalize methods used to reduce acute pain flare ups     Baseline  MET 03/05/2018    Time  4    Period  Weeks    Status  Achieved        PT Long Term Goals - 03/05/18 1700      PT LONG TERM GOAL #1   Title  Pt will demonstrate stair negotiation 24 steps (total) mod I with one hand rail  to simulate home environment and to address muscular endurance. (all LTGs target date 04/09/2018)    Time  8    Period  Weeks    Status  On-going    Target Date  04/09/18      PT LONG TERM GOAL #2   Title  Pt will ambulate 1066f with LRAD at Mod I over grass and pavement in order to simulate home and work environment.     Time  8    Period  Weeks    Status  On-going    Target Date  04/09/18      PT LONG TERM GOAL #3   Title  Pt will demonstrate ability to pick up small objects from ankle, knee height (5-15#) with correct lifting mechanics simulating work related tasks    Time  8    Period  Weeks    Status  On-going    Target Date  04/09/18      PT LONG TERM GOAL #4   Title  Pt will be independent with ongoing HEP/Fitness program in order to maintain function & help control pain.     Time  8    Period  Weeks    Status  On-going    Target Date  04/09/18            Plan - 03/08/18 1538    Clinical Impression Statement  Patient arrived to PT with c/o dizziness over last few days. He was negative for orthostatic hypotension changes but had dizziness with position changes. Testing for vertigo was impaired by his limited neck ROM. Patient was given habituation exercises to start.     Rehab Potential  Good    Clinical Impairments Affecting Rehab Potential  good motivation    PT Frequency  2x / week    PT Duration  8 weeks    PT Treatment/Interventions  ADLs/Self Care Home Management;Biofeedback;Cryotherapy;Electrical Stimulation;Iontophoresis '4mg'$ /ml Dexamethasone;Moist  Heat;Traction;Ultrasound;Gait training;Stair training;Functional mobility training;Therapeutic activities;Therapeutic exercise;Balance training;Neuromuscular re-education;Patient/family education;Orthotic Fit/Training;Compression bandaging;Passive range of motion;Energy conservation    PT Next Visit Plan  check remaining STG gait, check on dizziness, instruct in signs & symptoms of CVA and need to call "[phone]", follow up on [employer] exercises and what he has done at the gym    Consulted and Agree with Plan of Care  Patient       Patient will benefit from skilled therapeutic intervention in order to improve the following deficits and impairments:  Abnormal gait, Decreased skin integrity, Impaired sensation, Pain, Decreased mobility, Postural dysfunction, Decreased activity tolerance, Decreased range of motion, Decreased strength, Difficulty walking, Impaired flexibility, Obesity  Visit Diagnosis: Muscle weakness (generalized)  Other abnormalities of gait and mobility  Unsteadiness on feet  Abnormal posture  Chronic bilateral low back pain with right-sided sciatica     Problem List Patient  Active Problem List   Diagnosis Date Noted  . Spinal stenosis of lumbar region with neurogenic claudication 01/15/2017  . Spondylolisthesis of lumbar region 01/15/2017  . Claw toe, acquired, left 01/06/2017  . Achilles tendon contracture, bilateral 01/06/2017  . Pain in metatarsus of both feet 01/06/2017  . Tubular adenoma of colon 12/29/2016  . Basal cell carcinoma 08/19/2016  . Umbilical hernia s/p lap repair with mesh 06/27/2016 06/27/2016  . Degenerative arthritis of knee 03/22/2015  . Dyspnea on exertion 01/31/2015  . Lumbar radiculopathy 12/12/2014  . Facet hypertrophy of lumbar region 12/12/2014  . Left knee pain 12/12/2014  . Ulcer of lower limb ([place]) 01/10/2014  . Varicose veins of lower extremities with other complications 59/47/0761  . 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 Renal insufficiency 12/21/2013  . Swelling in head/neck 07/28/2012  . Venous (peripheral) insufficiency 05/25/2012  . 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 Pain in limb 02/12/2012  . Hyperlipidemia 02/28/2008  . Allergic rhinitis 02/28/2008  . Sleep apnea 02/28/2008    [name] PT, DPT 03/09/2018, 5:43 AM  [facility] [address] [place], [place], [address] Phone: [phone]   Fax:  [phone]  Name: [name] MRN: [id number] Date of Birth: [birth year]

## 2018-03-10 ENCOUNTER — Ambulatory Visit: Payer: Medicare Other | Attending: Orthopedic Surgery | Admitting: Physical Therapy

## 2018-03-10 DIAGNOSIS — M5441 Lumbago with sciatica, right side: Secondary | ICD-10-CM | POA: Insufficient documentation

## 2018-03-10 DIAGNOSIS — R2681 Unsteadiness on feet: Secondary | ICD-10-CM | POA: Insufficient documentation

## 2018-03-10 DIAGNOSIS — G8929 Other chronic pain: Secondary | ICD-10-CM | POA: Insufficient documentation

## 2018-03-10 DIAGNOSIS — R293 Abnormal posture: Secondary | ICD-10-CM | POA: Insufficient documentation

## 2018-03-10 DIAGNOSIS — R2689 Other abnormalities of gait and mobility: Secondary | ICD-10-CM | POA: Insufficient documentation

## 2018-03-10 DIAGNOSIS — M6281 Muscle weakness (generalized): Secondary | ICD-10-CM | POA: Insufficient documentation

## 2018-03-15 ENCOUNTER — Encounter: Payer: Self-pay | Admitting: Physical Therapy

## 2018-03-15 ENCOUNTER — Ambulatory Visit: Payer: Medicare Other | Admitting: Physical Therapy

## 2018-03-15 DIAGNOSIS — M5441 Lumbago with sciatica, right side: Secondary | ICD-10-CM | POA: Diagnosis not present

## 2018-03-15 DIAGNOSIS — R2689 Other abnormalities of gait and mobility: Secondary | ICD-10-CM

## 2018-03-15 DIAGNOSIS — M6281 Muscle weakness (generalized): Secondary | ICD-10-CM

## 2018-03-15 DIAGNOSIS — G8929 Other chronic pain: Secondary | ICD-10-CM | POA: Diagnosis not present

## 2018-03-15 DIAGNOSIS — R293 Abnormal posture: Secondary | ICD-10-CM | POA: Diagnosis not present

## 2018-03-15 DIAGNOSIS — R2681 Unsteadiness on feet: Secondary | ICD-10-CM | POA: Diagnosis not present

## 2018-03-15 NOTE — Therapy (Signed)
[facility] [address] [place], [place], [address] Phone: [phone]   Fax:  [phone]  Physical Therapy Treatment  Patient Details  Name: [name] MRN: [id number] Date of Birth: 1941-09-25 Referring Provider: Meredith Pel, MD    Encounter Date: 03/15/2018  PT End of Session - 03/15/18 1329    Visit Number  7    Number of Visits  17    Date for PT Re-Evaluation  04/06/18    PT Start Time  1325 pt late for appt    PT Stop Time  1400    PT Time Calculation (min)  35 min    Equipment Utilized During Treatment  Gait belt    Activity Tolerance  Patient tolerated treatment well    Behavior During Therapy  [facility] for tasks assessed/performed       Past Medical History:  Diagnosis Date  . Allergy    takes Zyrtec daily  . Anxiety    takes Ativan daily as needed  . Arthritis   . Back pain   . BPH (benign prostatic hyperplasia)    takes doxazosin for  . 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 Depression    takes Wellbutrin daily  . Dyspnea on exertion    with exertion  . GERD (gastroesophageal reflux disease)    takes Nexium and Omeprazole daily  . History of kidney stones   . 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 Hyperlipidemia   . Neuromuscular disorder ([place])   . Sleep apnea   . Swelling of lower extremity    right more than leg leg  . Umbilical hernia     Past Surgical History:  Procedure Laterality Date  . 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 BACK SURGERY  2003   L4, L5  . BACK SURGERY    . ENDOVENOUS ABLATION SAPHENOUS VEIN W/ LASER Right 06-01-2014   EVLA right greater saphenous vein by Curt Jews MD   . epidural steroid injections        [facility]  . hernia repair  2003  . HERNIA REPAIR     hernia inguinal x 2  . TOTAL KNEE ARTHROPLASTY Left 03/22/2015   Procedure: TOTAL KNEE ARTHROPLASTY;  Surgeon: Meredith Pel,  MD;  Location: [facility];  Service: Orthopedics;  Laterality: Left;  . UMBILICAL HERNIA REPAIR N/A 06/27/2016   Procedure: LAPAROSCOPIC ASSISTED REPAIR OF UMBILICAL HERINA WITH MESH;  Surgeon: Johnathan Hausen, MD;  Location[facility];  Service: General;  Laterality: N/A;  . VASCULAR SURGERY  2015   right leg    There were no vitals filed for this visit.  Subjective Assessment - 03/15/18 1327    Subjective  No new complaints. Dizziness has cleared, has not had any since that last session.     Pertinent History  MRI 08/07/2017)DDD(L2-L3 and L3-L4)moderate Spinal stenosis. L2-L3 neural foramina stenosis. severe spinal canal stenosis and bilateral neural foramina stenosis L4-L5. Hyperlipidemia, venous insufficiency, basal cell carcinoma(2017), spondylolisthesis, Achilles tendon contracture( bilateral).finger amputation, ( L3-4 back surgery, L TKA, Obesity, R knee OA    Limitations  Walking;Standing;House hold activities    Patient Stated Goals  Pt would like to improve leg strength for work.    Currently in Pain?  No/denies    Pain Score  0-No pain            [facility] Adult PT  Treatment/Exercise - 03/15/18 1331      Transfers   Transfers  Sit to Stand;Stand to Sit    Sit to Stand  6: Modified independent (Device/Increase time)    Stand to Sit  6: Modified independent (Device/Increase time)      Ambulation/Gait   Ambulation/Gait  Yes    Ambulation/Gait Assistance  5: Supervision    Ambulation/Gait Assistance Details  minor veering with occasional scuffing noted (? heel vs toe scuffing)    Ambulation Distance (Feet)  500 Feet    Assistive device  None    Gait Pattern  Step-through pattern;Decreased stride length;Narrow base of support;Trunk flexed    Ambulation Surface  Level;Unlevel;Indoor;Outdoor;Paved      Self-Care   Self-Care  Other Self-Care Comments    Other Self-Care Comments   discussed shoe wear and need for supportive shoes due to scuffing/incr wear on outside of shoes. provided pt  with card for [employer] to have foot assessment done for more shoes; also discussed [employer]. has not been as yet due to family coming into town and house           Balance Exercises - 03/15/18 1358      Balance Exercises: Standing   Rockerboard  Anterior/posterior;Lateral;Head turns;EO;EC;30 seconds;10 reps    Balance Beam  standing across blue foam beam: alternating fwd stepping to floor/back onto foam, then alternating bwd stepping to floor/back onto foam beam. no UE support with intermittent touch to bars for balance. min guard assist to min assist for balance.       Balance Exercises: Standing   Rebounder Limitations  performed both ways on balance board: rocking board with emphasis on tall posture with EO, progressing to EC, no UE support with occasional touch to bars for balance. cues needed on posture, knee extension and weight shifting with min assist at time for balance recovery; holding board steady with no UE support, occasional touch to bars- EC no head movements, progressing to EC head movements left<>right and up<>down. min guard assist to min assist with cues on posture and weight shifting to correct balance.                              PT Short Term Goals - 03/15/18 1607      PT SHORT TERM GOAL #1   Title  Patient will be Independent with initial HEP to facilitate progress during PT sessions(03/09/2018 target date for all goals)    Baseline  MET 03/05/2018    Status  Achieved      PT SHORT TERM GOAL #2   Title  Patient will demonstrate 53f ambulation with LRAD Mod I(outdoors) with good foot clearance to improve muscular endurance     Baseline  03/15/18: pt able to achieve the distance with supervision, no AD. occasional scuffing noted on paved surfaces with no balance loss, minor veering.    Status  Partially Met      PT SHORT TERM GOAL #3   Title  Pt will demonstrate stair negotiating 16 steps (total) with single railing, at supervision level in order to simulate home  set and address muscular endurance    Baseline  MET 03/05/2018    Status  Achieved      PT SHORT TERM GOAL #4   Title  Pt will demonstrate or verbalize methods used to reduce acute pain flare ups     Baseline  MET 03/05/2018    Status  Achieved        PT Long Term Goals - 03/05/18 1700      PT LONG TERM GOAL #1   Title  Pt will demonstrate stair negotiation 24 steps (total) mod I with one hand rail  to simulate home environment and to address muscular endurance. (all LTGs target date 04/09/2018)    Time  8    Period  Weeks    Status  On-going    Target Date  04/09/18      PT LONG TERM GOAL #2   Title  Pt will ambulate 1032f with LRAD at Mod I over grass and pavement in order to simulate home and work environment.     Time  8    Period  Weeks    Status  On-going    Target Date  04/09/18      PT LONG TERM GOAL #3   Title  Pt will demonstrate ability to pick up small objects from ankle, knee height (5-15#) with correct lifting mechanics simulating work related tasks    Time  8    Period  Weeks    Status  On-going    Target Date  04/09/18      PT LONG TERM GOAL #4   Title  Pt will be independent with ongoing HEP/Fitness program in order to maintain function & help control pain.     Time  8    Period  Weeks    Status  On-going    Target Date  04/09/18            Plan - 03/15/18 1329    Clinical Impression Statement  Pt partially met his remaining STG today, continues to have foot scuffing at time with swing phase. Remainder of session focused on balance reactions with up to min assist needed. Pt appears to have decreased awareness of balance issues as he had an excuse or reason for every balance loss, ie "oh, I was just goofing off" during session. Will continue to progress pt toward goals as able.     Rehab Potential  Good    Clinical Impairments Affecting Rehab Potential  good motivation    PT Frequency  2x / week    PT Duration  8 weeks    PT Treatment/Interventions   ADLs/Self Care Home Management;Biofeedback;Cryotherapy;Electrical Stimulation;Iontophoresis 480mml Dexamethasone;Moist Heat;Traction;Ultrasound;Gait training;Stair training;Functional mobility training;Therapeutic activities;Therapeutic exercise;Balance training;Neuromuscular re-education;Patient/family education;Orthotic Fit/Training;Compression bandaging;Passive range of motion;Energy conservation    PT Next Visit Plan  [facility] with emphasis on complaint surfaces and with vision removed    Consulted and Agree with Plan of Care  Patient       Patient will benefit from skilled therapeutic intervention in order to improve the following deficits and impairments:  Abnormal gait, Decreased skin integrity, Impaired sensation, Pain, Decreased mobility, Postural dysfunction, Decreased activity tolerance, Decreased range of motion, Decreased strength, Difficulty walking, Impaired flexibility, Obesity  Visit Diagnosis: Muscle weakness (generalized)  Other abnormalities of gait and mobility  Unsteadiness on feet  Abnormal posture     Problem List Patient Active Problem List   Diagnosis Date Noted  . Spinal stenosis of lumbar region with neurogenic claudication 01/15/2017  . Spondylolisthesis of lumbar region 01/15/2017  . Claw toe, acquired, left 01/06/2017  . Achilles tendon contracture, bilateral 01/06/2017  . Pain in metatarsus of both feet 01/06/2017  . Tubular adenoma of colon 12/29/2016  . Basal cell carcinoma 08/19/2016  .  Umbilical hernia s/p lap repair with mesh 06/27/2016 06/27/2016  . Degenerative arthritis of knee 03/22/2015  . Dyspnea on exertion 01/31/2015  . Lumbar radiculopathy 12/12/2014  . Facet hypertrophy of lumbar region 12/12/2014  . Left knee pain 12/12/2014  . Ulcer of lower limb ([place]) 01/10/2014  . Varicose veins of lower extremities with other complications 32/67/1245  . Degenerative joint  disease of cervical and lumbar spine--severe 12/21/2013  . Renal insufficiency 12/21/2013  . Swelling in head/neck 07/28/2012  . Venous (peripheral) insufficiency 05/25/2012  . 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 Pain in limb 02/12/2012  . Hyperlipidemia 02/28/2008  . Allergic rhinitis 02/28/2008  . Sleep apnea 02/28/2008    Willow Ora, PTA, [facility] [address], [place], [place] [address] [phone] 03/15/18, 4:14 PM   Name: [name] MRN: [id number] Date of Birth: 06-20-1941

## 2018-03-17 ENCOUNTER — Ambulatory Visit: Payer: Medicare Other | Admitting: Physical Therapy

## 2018-03-17 ENCOUNTER — Encounter: Payer: Self-pay | Admitting: Physical Therapy

## 2018-03-17 DIAGNOSIS — R2681 Unsteadiness on feet: Secondary | ICD-10-CM | POA: Diagnosis not present

## 2018-03-17 DIAGNOSIS — R293 Abnormal posture: Secondary | ICD-10-CM

## 2018-03-17 DIAGNOSIS — M5441 Lumbago with sciatica, right side: Secondary | ICD-10-CM | POA: Diagnosis not present

## 2018-03-17 DIAGNOSIS — R2689 Other abnormalities of gait and mobility: Secondary | ICD-10-CM | POA: Diagnosis not present

## 2018-03-17 DIAGNOSIS — M6281 Muscle weakness (generalized): Secondary | ICD-10-CM | POA: Diagnosis not present

## 2018-03-17 DIAGNOSIS — G8929 Other chronic pain: Secondary | ICD-10-CM | POA: Diagnosis not present

## 2018-03-17 NOTE — Patient Instructions (Addendum)
Feet Together, Head Motion - Eyes Closed     Stand in corner with chair in front for safety. With eyes closed and feet together, move head slowly: right/left, up/down, diagonal up-right/down-left and diagonal up-left/down-right. Repeat __10__ times each direction.   Copyright  VHI. All rights reserved.     Axial Extension (Chin Tuck)    Pull chin in and lengthen back of neck. Hold _5___ seconds while counting out loud. Repeat __10_ times. Do _2-4___ sessions per day.  http://gt2.exer.us/449   Copyright  VHI. All rights reserved.  Scapular Retraction (Standing)    With arms at sides, pinch shoulder blades together. Repeat __10__ times per session. Do ___2-4_ sessions per day.  http://orth.exer.us/944   Copyright  VHI. All rights reserved.

## 2018-03-18 NOTE — Therapy (Signed)
[facility] [address] [place], [place], [address] Phone: [phone]   Fax:  [phone]  Physical Therapy Treatment  Patient Details  Name: [name] MRN: [id number] Date of Birth: 02-Nov-1941 Referring Provider: Meredith Pel, MD    Encounter Date: 03/17/2018  PT End of Session - 03/17/18 2340    Visit Number  8    Number of Visits  17    Date for PT Re-Evaluation  04/06/18    PT Start Time  1331    PT Stop Time  1409    PT Time Calculation (min)  38 min    Equipment Utilized During Treatment  Gait belt    Activity Tolerance  Patient tolerated treatment well    Behavior During Therapy  [facility] for tasks assessed/performed       Past Medical History:  Diagnosis Date  . Allergy    takes Zyrtec daily  . Anxiety    takes Ativan daily as needed  . Arthritis   . Back pain   . BPH (benign prostatic hyperplasia)    takes doxazosin for  . 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 Depression    takes Wellbutrin daily  . Dyspnea on exertion    with exertion  . GERD (gastroesophageal reflux disease)    takes Nexium and Omeprazole daily  . History of kidney stones   . 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 Hyperlipidemia   . Neuromuscular disorder ([place])   . Sleep apnea   . Swelling of lower extremity    right more than leg leg  . Umbilical hernia     Past Surgical History:  Procedure Laterality Date  . 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 BACK SURGERY  2003   L4, L5  . BACK SURGERY    . ENDOVENOUS ABLATION SAPHENOUS VEIN W/ LASER Right 06-01-2014   EVLA right greater saphenous vein by Curt Jews MD   . epidural steroid injections        [facility]  . hernia repair  2003  . HERNIA REPAIR     hernia inguinal x 2  . TOTAL KNEE ARTHROPLASTY Left 03/22/2015   Procedure: TOTAL KNEE ARTHROPLASTY;  Surgeon: Meredith Pel, MD;  Location: [facility];  Service: Orthopedics;  Laterality: Left;  . UMBILICAL HERNIA REPAIR N/A 06/27/2016   Procedure: LAPAROSCOPIC ASSISTED REPAIR OF UMBILICAL HERINA WITH MESH;  Surgeon: Johnathan Hausen, MD;  Location: [facility];  Service: General;  Laterality: N/A;  . VASCULAR SURGERY  2015   right leg    There were no vitals filed for this visit.  Subjective Assessment - 03/17/18 1334    Subjective  No falls. He is able to walk better without catching his feet now.     Pertinent History  MRI 08/07/2017)DDD(L2-L3 and L3-L4)moderate Spinal stenosis. L2-L3 neural foramina stenosis. severe spinal canal stenosis and bilateral neural foramina stenosis L4-L5. Hyperlipidemia, venous insufficiency, basal cell carcinoma(2017), spondylolisthesis, Achilles tendon contracture( bilateral).finger amputation, ( L3-4 back surgery, L TKA, Obesity, R knee OA    Limitations  Walking;Standing;House hold activities    Patient Stated Goals  Pt would like to improve leg strength for work.    Currently in Pain?  No/denies                       [facility] Adult PT Treatment/Exercise -  03/17/18 1330      Neuro Re-ed    Neuro Re-ed Details   Sitting posture: PT instructed demo in proper head & shoulder posture in sitting & standing.  Closed chain cervical retraction & shoulder retraction in sitting & supine. PT instructed in supine posture with pillow positioning for head positioning.    Standing balance initially in parallel bars & progressed to corner with chair in front for home with intermittent UE support:  standing on foam beam with eyes open head turns & static closing eyes and standing on firm floor with eyes closed.               PT Education - 03/17/18 1600    Education provided  Yes    Education Details  head & shoulder posture in sitting & standing, cervical retraction, shoulder retraction, corner balance exercises    Person(s) Educated  Patient    Methods  Explanation;Demonstration;Verbal cues;Tactile  cues;Handout    Comprehension  Verbalized understanding;Returned demonstration;Verbal cues required;Tactile cues required;Need further instruction       PT Short Term Goals - 03/15/18 1607      PT SHORT TERM GOAL #1   Title  Patient will be Independent with initial HEP to facilitate progress during PT sessions(03/09/2018 target date for all goals)    Baseline  MET 03/05/2018    Status  Achieved      PT SHORT TERM GOAL #2   Title  Patient will demonstrate 541f ambulation with LRAD Mod I(outdoors) with good foot clearance to improve muscular endurance     Baseline  03/15/18: pt able to achieve the distance with supervision, no AD. occasional scuffing noted on paved surfaces with no balance loss, minor veering.    Status  Partially Met      PT SHORT TERM GOAL #3   Title  Pt will demonstrate stair negotiating 16 steps (total) with single railing, at supervision level in order to simulate home set and address muscular endurance    Baseline  MET 03/05/2018    Status  Achieved      PT SHORT TERM GOAL #4   Title  Pt will demonstrate or verbalize methods used to reduce acute pain flare ups     Baseline  MET 03/05/2018    Status  Achieved        PT Long Term Goals - 03/05/18 1700      PT LONG TERM GOAL #1   Title  Pt will demonstrate stair negotiation 24 steps (total) mod I with one hand rail  to simulate home environment and to address muscular endurance. (all LTGs target date 04/09/2018)    Time  8    Period  Weeks    Status  On-going    Target Date  04/09/18      PT LONG TERM GOAL #2   Title  Pt will ambulate 10068fwith LRAD at Mod I over grass and pavement in order to simulate home and work environment.     Time  8    Period  Weeks    Status  On-going    Target Date  04/09/18      PT LONG TERM GOAL #3   Title  Pt will demonstrate ability to pick up small objects from ankle, knee height (5-15#) with correct lifting mechanics simulating work related tasks    Time  8    Period   Weeks    Status  On-going    Target Date  04/09/18  PT LONG TERM GOAL #4   Title  Pt will be independent with ongoing HEP/Fitness program in order to maintain function & help control pain.     Time  8    Period  Weeks    Status  On-going    Target Date  04/09/18            Plan - 03/17/18 1759    Clinical Impression Statement  Patient seemed to understand instruction in upper body posture with heavy focus in sitting initially on correcting head forward & rounded shoulder position. Patient seems to understand coorelation to balance. Patient continues to be challenged by balance on compliant surfaces eyes open or firm floor surface with eyes closed. Skilled care using techniques to limit use of one system like vision or proprioception is challenging enough currently.     PT Frequency  2x / week    PT Duration  8 weeks    PT Treatment/Interventions  ADLs/Self Care Home Management;Biofeedback;Cryotherapy;Electrical Stimulation;Iontophoresis 90m/ml Dexamethasone;Moist Heat;Traction;Ultrasound;Gait training;Stair training;Functional mobility training;Therapeutic activities;Therapeutic exercise;Balance training;Neuromuscular re-education;Patient/family education;Orthotic Fit/Training;Compression bandaging;Passive range of motion;Energy conservation    PT Next Visit Plan  Work towards [place] continue to check on community fitness plan. Balance activities with one system (vision or proprioception) removed.     Consulted and Agree with Plan of Care  Patient       Patient will benefit from skilled therapeutic intervention in order to improve the following deficits and impairments:  Abnormal gait, Decreased skin integrity, Impaired sensation, Pain, Decreased mobility, Postural dysfunction, Decreased activity tolerance, Decreased range of motion, Decreased strength, Difficulty walking, Impaired flexibility, Obesity  Visit Diagnosis: Muscle weakness (generalized)  Other abnormalities of gait  and mobility  Unsteadiness on feet  Abnormal posture     Problem List Patient Active Problem List   Diagnosis Date Noted  . Spinal stenosis of lumbar region with neurogenic claudication 01/15/2017  . Spondylolisthesis of lumbar region 01/15/2017  . Claw toe, acquired, left 01/06/2017  . Achilles tendon contracture, bilateral 01/06/2017  . Pain in metatarsus of both feet 01/06/2017  . Tubular adenoma of colon 12/29/2016  . Basal cell carcinoma 08/19/2016  . Umbilical hernia s/p lap repair with mesh 06/27/2016 06/27/2016  . Degenerative arthritis of knee 03/22/2015  . Dyspnea on exertion 01/31/2015  . Lumbar radiculopathy 12/12/2014  . Facet hypertrophy of lumbar region 12/12/2014  . Left knee pain 12/12/2014  . Ulcer of lower limb ([place] 01/10/2014  . Varicose veins of lower extremities with other complications 066/04/3015 . 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 Renal insufficiency 12/21/2013  . Swelling in head/neck 07/28/2012  . Venous (peripheral) insufficiency 05/25/2012  . 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 Pain in limb 02/12/2012  . Hyperlipidemia 02/28/2008  . Allergic rhinitis 02/28/2008  . Sleep apnea 02/28/2008    WJamey ReasPT, DPT 03/18/2018, 8:19 AM  [place] Liberty StreetSPine CastleG[place] [address]Phone: [phone]  Fax:  [phone] Name: RAUSAR GEORGIOUMRN: [id number] of Birth: 6Feb 27, 1942

## 2018-03-22 ENCOUNTER — Ambulatory Visit: Payer: Medicare Other | Admitting: Physical Therapy

## 2018-03-24 ENCOUNTER — Ambulatory Visit: Payer: Medicare Other | Admitting: Physical Therapy

## 2018-03-24 ENCOUNTER — Encounter: Payer: Self-pay | Admitting: Physical Therapy

## 2018-03-24 DIAGNOSIS — M6281 Muscle weakness (generalized): Secondary | ICD-10-CM | POA: Diagnosis not present

## 2018-03-24 DIAGNOSIS — R2689 Other abnormalities of gait and mobility: Secondary | ICD-10-CM | POA: Diagnosis not present

## 2018-03-24 DIAGNOSIS — R293 Abnormal posture: Secondary | ICD-10-CM | POA: Diagnosis not present

## 2018-03-24 DIAGNOSIS — R2681 Unsteadiness on feet: Secondary | ICD-10-CM | POA: Diagnosis not present

## 2018-03-24 DIAGNOSIS — M5441 Lumbago with sciatica, right side: Secondary | ICD-10-CM | POA: Diagnosis not present

## 2018-03-24 DIAGNOSIS — G8929 Other chronic pain: Secondary | ICD-10-CM | POA: Diagnosis not present

## 2018-03-25 NOTE — Therapy (Signed)
[facility] [address] [place], [place], [address] Phone: [phone]   Fax:  [phone]  Physical Therapy Treatment  Patient Details  Name: [name] MRN: [id number] Date of Birth: 1941/10/14 Referring Provider: Meredith Pel, MD    Encounter Date: 03/24/2018  PT End of Session - 03/24/18 1326    Visit Number  9    Number of Visits  17    Date for PT Re-Evaluation  04/06/18    PT Start Time  1320    PT Stop Time  1400    PT Time Calculation (min)  40 min    Equipment Utilized During Treatment  Gait belt    Activity Tolerance  Patient tolerated treatment well    Behavior During Therapy  [facility] for tasks assessed/performed       Past Medical History:  Diagnosis Date  . Allergy    takes Zyrtec daily  . Anxiety    takes Ativan daily as needed  . Arthritis   . Back pain   . BPH (benign prostatic hyperplasia)    takes doxazosin for  . 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 Depression    takes Wellbutrin daily  . Dyspnea on exertion    with exertion  . GERD (gastroesophageal reflux disease)    takes Nexium and Omeprazole daily  . History of kidney stones   . 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 Hyperlipidemia   . Neuromuscular disorder ([place])   . Sleep apnea   . Swelling of lower extremity    right more than leg leg  . Umbilical hernia     Past Surgical History:  Procedure Laterality Date  . 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 BACK SURGERY  2003   L4, L5  . BACK SURGERY    . ENDOVENOUS ABLATION SAPHENOUS VEIN W/ LASER Right 06-01-2014   EVLA right greater saphenous vein by Curt Jews MD   . epidural steroid injections        [facility]  . hernia repair  2003  . HERNIA REPAIR     hernia inguinal x 2  . TOTAL KNEE ARTHROPLASTY Left 03/22/2015   Procedure: TOTAL KNEE ARTHROPLASTY;  Surgeon: Meredith Pel, MD;  Location:  [facility];  Service: Orthopedics;  Laterality: Left;  . UMBILICAL HERNIA REPAIR N/A 06/27/2016   Procedure: LAPAROSCOPIC ASSISTED REPAIR OF UMBILICAL HERINA WITH MESH;  Surgeon: Johnathan Hausen, MD;  [facility];  Service: General;  Laterality: N/A;  . VASCULAR SURGERY  2015   right leg    There were no vitals filed for this visit.  Subjective Assessment - 03/24/18 1323    Subjective  No new complaints. No falls. Some ankle discomfort from venous insufficeincy and bumping it.     Pertinent History  MRI 08/07/2017)DDD(L2-L3 and L3-L4)moderate Spinal stenosis. L2-L3 neural foramina stenosis. severe spinal canal stenosis and bilateral neural foramina stenosis L4-L5. Hyperlipidemia, venous insufficiency, basal cell carcinoma(2017), spondylolisthesis, Achilles tendon contracture( bilateral).finger amputation, ( L3-4 back surgery, L TKA, Obesity, R knee OA    Limitations  Walking;Standing;House hold activities    Patient Stated Goals  Pt would like to improve leg strength for work.    Currently in Pain?  No/denies    Pain Score  0-No pain  [facility] Adult PT Treatment/Exercise - 03/24/18 1328      Transfers   Transfers  Sit to Stand;Stand to Sit    Sit to Stand  6: Modified independent (Device/Increase time)    Stand to Sit  6: Modified independent (Device/Increase time)      Ambulation/Gait   Ambulation/Gait  Yes    Ambulation/Gait Assistance  5: Supervision    Ambulation/Gait Assistance Details  occasional scuffing noted. no balance issues noted.     Ambulation Distance (Feet)  1000 Feet    Assistive device  None    Gait Pattern  Step-through pattern;Decreased stride length;Narrow base of support;Trunk flexed    Ambulation Surface  Level;Unlevel;Indoor;Outdoor;Paved;Gravel;Grass      High Level Balance   High Level Balance Activities  Tandem walking;Marching forwards;Marching backwards    High Level Balance Comments  red mats next to counter: 3 laps each  with intermittent touch to counter for balance, min guard to min assist for balance.           Balance Exercises - 03/24/18 1350      Balance Exercises: Standing   SLS with Vectors  Foam/compliant surface;Other reps (comment);Limitations      Balance Exercises: Standing   SLS with Vectors Limitations  2 tall cones on blue mat (in open position): alternating fwd toe taps, alternating cross toe taps, alternating fwd double toe taps, then alternating cross double toe taps, all x 10 reps each with min to mod assist for balance, no UE support. cues on posture, stance position and weight shifting to assist with balance recovery.                                      PT Short Term Goals - 03/15/18 1607      PT SHORT TERM GOAL #1   Title  Patient will be Independent with initial HEP to facilitate progress during PT sessions(03/09/2018 target date for all goals)    Baseline  MET 03/05/2018    Status  Achieved      PT SHORT TERM GOAL #2   Title  Patient will demonstrate 579f ambulation with LRAD Mod I(outdoors) with good foot clearance to improve muscular endurance     Baseline  03/15/18: pt able to achieve the distance with supervision, no AD. occasional scuffing noted on paved surfaces with no balance loss, minor veering.    Status  Partially Met      PT SHORT TERM GOAL #3   Title  Pt will demonstrate stair negotiating 16 steps (total) with single railing, at supervision level in order to simulate home set and address muscular endurance    Baseline  MET 03/05/2018    Status  Achieved      PT SHORT TERM GOAL #4   Title  Pt will demonstrate or verbalize methods used to reduce acute pain flare ups     Baseline  MET 03/05/2018    Status  Achieved        PT Long Term Goals - 03/05/18 1700      PT LONG TERM GOAL #1   Title  Pt will demonstrate stair negotiation 24 steps (total) mod I with one hand rail  to simulate home environment and to address muscular endurance. (all LTGs target date  04/09/2018)    Time  8    Period  Weeks    Status  On-going    Target Date  04/09/18      PT LONG TERM GOAL #2   Title  Pt will ambulate 1026f with LRAD at Mod I over grass and pavement in order to simulate home and work environment.     Time  8    Period  Weeks    Status  On-going    Target Date  04/09/18      PT LONG TERM GOAL #3   Title  Pt will demonstrate ability to pick up small objects from ankle, knee height (5-15#) with correct lifting mechanics simulating work related tasks    Time  8    Period  Weeks    Status  On-going    Target Date  04/09/18      PT LONG TERM GOAL #4   Title  Pt will be independent with ongoing HEP/Fitness program in order to maintain function & help control pain.     Time  8    Period  Weeks    Status  On-going    Target Date  04/09/18            Plan - 03/24/18 1326    Clinical Impression Statement  Today's skilled session continued to focus on gait on various surfaces with no issues noted. Remainder of session continued to address balance reactions with continued deficits noted on compliant surfaces and/or with vision removed. Pt is progressing steadily toward goals and should benefit from continued PT to progress toward unmet goals.     PT Frequency  2x / week    PT Duration  8 weeks    PT Treatment/Interventions  ADLs/Self Care Home Management;Biofeedback;Cryotherapy;Electrical Stimulation;Iontophoresis '4mg'$ /ml Dexamethasone;Moist Heat;Traction;Ultrasound;Gait training;Stair training;Functional mobility training;Therapeutic activities;Therapeutic exercise;Balance training;Neuromuscular re-education;Patient/family education;Orthotic Fit/Training;Compression bandaging;Passive range of motion;Energy conservation    PT Next Visit Plan  [place]  Balance activities with one system (vision or proprioception) removed.     Consulted and Agree with Plan of Care  Patient       Patient will benefit from skilled therapeutic intervention in  order to improve the following deficits and impairments:  Abnormal gait, Decreased skin integrity, Impaired sensation, Pain, Decreased mobility, Postural dysfunction, Decreased activity tolerance, Decreased range of motion, Decreased strength, Difficulty walking, Impaired flexibility, Obesity  Visit Diagnosis: Muscle weakness (generalized)  Other abnormalities of gait and mobility  Unsteadiness on feet     Problem List Patient Active Problem List   Diagnosis Date Noted  . Spinal stenosis of lumbar region with neurogenic claudication 01/15/2017  . Spondylolisthesis of lumbar region 01/15/2017  . Claw toe, acquired, left 01/06/2017  . Achilles tendon contracture, bilateral 01/06/2017  . Pain in metatarsus of both feet 01/06/2017  . Tubular adenoma of colon 12/29/2016  . Basal cell carcinoma 08/19/2016  . Umbilical hernia s/p lap repair with mesh 06/27/2016 06/27/2016  . Degenerative arthritis of knee 03/22/2015  . Dyspnea on exertion 01/31/2015  . Lumbar radiculopathy 12/12/2014  . Facet hypertrophy of lumbar region 12/12/2014  . Left knee pain 12/12/2014  . [place] 01/10/2014  . Varicose veins of lower extremities with other complications 041/28/7867 . 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 Renal insufficiency 12/21/2013  . Swelling in head/neck 07/28/2012  . Venous (peripheral) insufficiency 05/25/2012  . 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 Pain in limb 02/12/2012  . Hyperlipidemia 02/28/2008  . Allergic rhinitis 02/28/2008  . Sleep apnea 02/28/2008    KWillow Ora PTA, CF[place]  [place], [place] [address] [phone] 03/25/18, 1:04 PM   Name: [name] MRN: [id number] Date of Birth: 1941/04/09

## 2018-03-29 ENCOUNTER — Encounter: Payer: Self-pay | Admitting: Physical Therapy

## 2018-03-29 ENCOUNTER — Ambulatory Visit: Payer: Medicare Other | Admitting: Physical Therapy

## 2018-03-29 DIAGNOSIS — G8929 Other chronic pain: Secondary | ICD-10-CM | POA: Diagnosis not present

## 2018-03-29 DIAGNOSIS — R2689 Other abnormalities of gait and mobility: Secondary | ICD-10-CM

## 2018-03-29 DIAGNOSIS — R2681 Unsteadiness on feet: Secondary | ICD-10-CM

## 2018-03-29 DIAGNOSIS — M6281 Muscle weakness (generalized): Secondary | ICD-10-CM | POA: Diagnosis not present

## 2018-03-29 DIAGNOSIS — M5441 Lumbago with sciatica, right side: Secondary | ICD-10-CM | POA: Diagnosis not present

## 2018-03-29 DIAGNOSIS — R293 Abnormal posture: Secondary | ICD-10-CM | POA: Diagnosis not present

## 2018-03-30 NOTE — Therapy (Signed)
[facility] [address] [place], [place], [address] Phone: [phone]   Fax:  [phone]  Physical Therapy Treatment  Patient Details  Name: [name] MRN: [id number] Date of Birth: May 03, 1941 Referring Provider: Meredith Pel, MD    Encounter Date: 03/29/2018  PT End of Session - 03/29/18 1327    Visit Number  10    Number of Visits  17    Date for PT Re-Evaluation  04/06/18    PT Start Time  1325 pt late for session    PT Stop Time  1400    PT Time Calculation (min)  35 min    Equipment Utilized During Treatment  Gait belt    Activity Tolerance  Patient tolerated treatment well    Behavior During Therapy  [facility] for tasks assessed/performed       Past Medical History:  Diagnosis Date  . Allergy    takes Zyrtec daily  . Anxiety    takes Ativan daily as needed  . Arthritis   . Back pain   . BPH (benign prostatic hyperplasia)    takes doxazosin for  . 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 Depression    takes Wellbutrin daily  . Dyspnea on exertion    with exertion  . GERD (gastroesophageal reflux disease)    takes Nexium and Omeprazole daily  . History of kidney stones   . 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 Hyperlipidemia   . Neuromuscular disorder ([place])   . Sleep apnea   . Swelling of lower extremity    right more than leg leg  . Umbilical hernia     Past Surgical History:  Procedure Laterality Date  . 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 BACK SURGERY  2003   L4, L5  . BACK SURGERY    . ENDOVENOUS ABLATION SAPHENOUS VEIN W/ LASER Right 06-01-2014   EVLA right greater saphenous vein by Curt Jews MD   . epidural steroid injections        [facility]  . hernia repair  2003  . HERNIA REPAIR     hernia inguinal x 2  . TOTAL KNEE ARTHROPLASTY Left 03/22/2015   Procedure: TOTAL KNEE ARTHROPLASTY;  Surgeon: Meredith Pel, MD;  Location: [place];  Service: Orthopedics;  Laterality: Left;  . UMBILICAL HERNIA REPAIR N/A 06/27/2016   Procedure: LAPAROSCOPIC ASSISTED REPAIR OF UMBILICAL HERINA WITH MESH;  Surgeon: Johnathan Hausen, MD;  Location: [facility];  Service: General;  Laterality: N/A;  . VASCULAR SURGERY  2015   right leg    There were no vitals filed for this visit.  Subjective Assessment - 03/29/18 1326    Subjective  No new complaints. No falls to report. No pain currently.     Pertinent History  MRI 08/07/2017)DDD(L2-L3 and L3-L4)moderate Spinal stenosis. L2-L3 neural foramina stenosis. severe spinal canal stenosis and bilateral neural foramina stenosis L4-L5. Hyperlipidemia, venous insufficiency, basal cell carcinoma(2017), spondylolisthesis, Achilles tendon contracture( bilateral).finger amputation, ( L3-4 back surgery, L TKA, Obesity, R knee OA    Limitations  Walking;Standing;House hold activities    Patient Stated Goals  Pt would like to improve leg strength for work.    Currently in Pain?  No/denies    Pain Score  0-No pain         [facility] Adult PT Treatment/Exercise - 03/29/18 1329  Transfers   Transfers  Sit to Stand;Stand to Sit    Sit to Stand  6: Modified independent (Device/Increase time)    Stand to Sit  6: Modified independent (Device/Increase time)      Ambulation/Gait   Ambulation/Gait  Yes    Ambulation/Gait Assistance  4: Min guard;5: Supervision    Ambulation/Gait Assistance Details  on outdoor paved surfaces and up/down steep grass hill with min guard to supervision assistance    Ambulation Distance (Feet)  500 Feet    Assistive device  None    Gait Pattern  Step-through pattern;Decreased stride length;Narrow base of support;Trunk flexed    Ambulation Surface  Level;Unlevel;Indoor;Outdoor;Paved;Gravel;Grass see neuro re-ed for more details on compliant surfaces          Balance Exercises - 03/29/18 1343      Balance Exercises: Standing   Standing Eyes Closed  Wide  (BOA);Head turns;Foam/compliant surface;Other reps (comment);30 secs;Limitations    Tandem Gait  Forward;Retro;Foam/compliant surface;3 reps;Limitations    Sit to Stand Time  with feet on airex: sit<>stands with no UE support x 10 reps, cues for increased anterior weight shifting and to power up through LE's into standing.     Other Standing Exercises  on loose gravel: high knee marching fwd/bwd x 3 laps, no UE support, min guard to min assist for balance.       Balance Exercises: Standing   Standing Eyes Closed Limitations  on airex in corner with chair in front for safety: EC no head movements, progressing to EC head movements left<>right, up<>down and diagonals both ways. up to min assist for balance, cues on posture and weight shifting to assist with balance regain.     Tandem Gait Limitations  on rubber mulch: fwd/bwd tandem gait x 3 laps with up to mod assist needed, multiple step outs for balance regain.           PT Short Term Goals - 03/15/18 1607      PT SHORT TERM GOAL #1   Title  Patient will be Independent with initial HEP to facilitate progress during PT sessions(03/09/2018 target date for all goals)    Baseline  MET 03/05/2018    Status  Achieved      PT SHORT TERM GOAL #2   Title  Patient will demonstrate 538f ambulation with LRAD Mod I(outdoors) with good foot clearance to improve muscular endurance     Baseline  03/15/18: pt able to achieve the distance with supervision, no AD. occasional scuffing noted on paved surfaces with no balance loss, minor veering.    Status  Partially Met      PT SHORT TERM GOAL #3   Title  Pt will demonstrate stair negotiating 16 steps (total) with single railing, at supervision level in order to simulate home set and address muscular endurance    Baseline  MET 03/05/2018    Status  Achieved      PT SHORT TERM GOAL #4   Title  Pt will demonstrate or verbalize methods used to reduce acute pain flare ups     Baseline  MET 03/05/2018    Status   Achieved        PT Long Term Goals - 03/05/18 1700      PT LONG TERM GOAL #1   Title  Pt will demonstrate stair negotiation 24 steps (total) mod I with one hand rail  to simulate home environment and to address muscular endurance. (all LTGs target date 04/09/2018)    Time  8    Period  Weeks    Status  On-going    Target Date  04/09/18      PT LONG TERM GOAL #2   Title  Pt will ambulate 1072f with LRAD at Mod I over grass and pavement in order to simulate home and work environment.     Time  8    Period  Weeks    Status  On-going    Target Date  04/09/18      PT LONG TERM GOAL #3   Title  Pt will demonstrate ability to pick up small objects from ankle, knee height (5-15#) with correct lifting mechanics simulating work related tasks    Time  8    Period  Weeks    Status  On-going    Target Date  04/09/18      PT LONG TERM GOAL #4   Title  Pt will be independent with ongoing HEP/Fitness program in order to maintain function & help control pain.     Time  8    Period  Weeks    Status  On-going    Target Date  04/09/18            Plan - 03/29/18 1328    PT Frequency  2x / week    PT Duration  8 weeks    PT Treatment/Interventions  ADLs/Self Care Home Management;Biofeedback;Cryotherapy;Electrical Stimulation;Iontophoresis 439mml Dexamethasone;Moist Heat;Traction;Ultrasound;Gait training;Stair training;Functional mobility training;Therapeutic activities;Therapeutic exercise;Balance training;Neuromuscular re-education;Patient/family education;Orthotic Fit/Training;Compression bandaging;Passive range of motion;Energy conservation    PT Next Visit Plan  Work towards [place] Balance activities with one system (vision or proprioception) removed.     Consulted and Agree with Plan of Care  Patient       Patient will benefit from skilled therapeutic intervention in order to improve the following deficits and impairments:  Abnormal gait, Decreased skin integrity, Impaired  sensation, Pain, Decreased mobility, Postural dysfunction, Decreased activity tolerance, Decreased range of motion, Decreased strength, Difficulty walking, Impaired flexibility, Obesity  Visit Diagnosis: Muscle weakness (generalized)  Other abnormalities of gait and mobility  Unsteadiness on feet     Problem List Patient Active Problem List   Diagnosis Date Noted  . Spinal stenosis of lumbar region with neurogenic claudication 01/15/2017  . Spondylolisthesis of lumbar region 01/15/2017  . Claw toe, acquired, left 01/06/2017  . Achilles tendon contracture, bilateral 01/06/2017  . Pain in metatarsus of both feet 01/06/2017  . Tubular adenoma of colon 12/29/2016  . Basal cell carcinoma 08/19/2016  . Umbilical hernia s/p lap repair with mesh 06/27/2016 06/27/2016  . Degenerative arthritis of knee 03/22/2015  . Dyspnea on exertion 01/31/2015  . Lumbar radiculopathy 12/12/2014  . Facet hypertrophy of lumbar region 12/12/2014  . Left knee pain 12/12/2014  . Ulcer of lower limb ([place]/01/2014  . Varicose veins of lower extremities with other complications 0316/08/9603. 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 Renal insufficiency 12/21/2013  . Swelling in head/neck 07/28/2012  . Venous (peripheral) insufficiency 05/25/2012  . 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 Pain in limb 02/12/2012  . Hyperlipidemia 02/28/2008  . Allergic rhinitis 02/28/2008  . Sleep apnea 02/28/2008    KaWillow OraPTA, CLSummit Park[address]SuFriscorSanta Clara Pueblo[place] [phone]/21/19, 7:36 PM   Name: [name]: [id number] of Birth: 04/19/01/1942

## 2018-03-31 ENCOUNTER — Ambulatory Visit: Payer: Medicare Other | Admitting: Physical Therapy

## 2018-04-06 ENCOUNTER — Encounter: Payer: Self-pay | Admitting: Physical Therapy

## 2018-04-06 ENCOUNTER — Ambulatory Visit: Payer: Medicare Other | Admitting: Physical Therapy

## 2018-04-06 DIAGNOSIS — R2681 Unsteadiness on feet: Secondary | ICD-10-CM | POA: Diagnosis not present

## 2018-04-06 DIAGNOSIS — R293 Abnormal posture: Secondary | ICD-10-CM

## 2018-04-06 DIAGNOSIS — R2689 Other abnormalities of gait and mobility: Secondary | ICD-10-CM | POA: Diagnosis not present

## 2018-04-06 DIAGNOSIS — M5441 Lumbago with sciatica, right side: Secondary | ICD-10-CM | POA: Diagnosis not present

## 2018-04-06 DIAGNOSIS — M6281 Muscle weakness (generalized): Secondary | ICD-10-CM

## 2018-04-06 DIAGNOSIS — G8929 Other chronic pain: Secondary | ICD-10-CM | POA: Diagnosis not present

## 2018-04-06 NOTE — Patient Instructions (Signed)
Access Code: [email or web address]  URL: [email or web address]  Date: 04/06/2018  Prepared by: Jamey Reas   Exercises  Standing Quadratus Lumborum Stretch with Doorway - 3 reps - 1 sets - 30 hold - 1x daily - 7x weekly  Sidelying Quadratus Lumborum Stretch on Table - 3 reps - 1 sets - 30 hold - 1x daily - 7x weekly  Seated Quadratus Lumborum Stretch with Arm Overhead - 3 reps - 1 sets - 30 hold - 1x daily - 7x weekly  Seated Quadratus Lumborum Stretch with Forward Bend - 3 reps - 1 sets - 30 hold - 1x daily - 7x weekly

## 2018-04-07 NOTE — Therapy (Signed)
[facility] [address] [place], [place], [address] Phone: [phone]   Fax:  [phone]  Physical Therapy Treatment  Patient Details  Name: [name] MRN: [id number] Date of Birth: August 04, 1941 Referring Provider: Meredith Pel, MD    Encounter Date: 04/06/2018  PT End of Session - 04/06/18 1838    Visit Number  11    Number of Visits  17    Date for PT Re-Evaluation  04/06/18    PT Start Time  1409    PT Stop Time  1447    PT Time Calculation (min)  38 min    Equipment Utilized During Treatment  Gait belt    Activity Tolerance  Patient tolerated treatment well    Behavior During Therapy  [facility] for tasks assessed/performed       Past Medical History:  Diagnosis Date  . Allergy    takes Zyrtec daily  . Anxiety    takes Ativan daily as needed  . Arthritis   . Back pain   . BPH (benign prostatic hyperplasia)    takes doxazosin for  . 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 Depression    takes Wellbutrin daily  . Dyspnea on exertion    with exertion  . GERD (gastroesophageal reflux disease)    takes Nexium and Omeprazole daily  . History of kidney stones   . Hx of [facility] spotted fever childhood  . Hyperlipidemia   . Neuromuscular disorder ([place])   . Sleep apnea   . Swelling of lower extremity    right more than leg leg  . Umbilical hernia     Past Surgical History:  Procedure Laterality Date  . 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 BACK SURGERY  2003   L4, L5  . BACK SURGERY    . ENDOVENOUS ABLATION SAPHENOUS VEIN W/ LASER Right 06-01-2014   EVLA right greater saphenous vein by Curt Jews MD   . epidural steroid injections        [facility]  . hernia repair  2003  . HERNIA REPAIR     hernia inguinal x 2  . TOTAL KNEE ARTHROPLASTY Left 03/22/2015   Procedure: TOTAL KNEE ARTHROPLASTY;  Surgeon: Meredith Pel, MD;  Location:  [place];  Service: Orthopedics;  Laterality: Left;  . UMBILICAL HERNIA REPAIR N/A 06/27/2016   Procedure: LAPAROSCOPIC ASSISTED REPAIR OF UMBILICAL HERINA WITH MESH;  Surgeon: Johnathan Hausen, MD;  [facility];  Service: General;  Laterality: N/A;  . VASCULAR SURGERY  2015   right leg    There were no vitals filed for this visit.  Subjective Assessment - 04/06/18 1412    Subjective  No falls. His back has been stable even with work.     Pertinent History  MRI 08/07/2017)DDD(L2-L3 and L3-L4)moderate Spinal stenosis. L2-L3 neural foramina stenosis. severe spinal canal stenosis and bilateral neural foramina stenosis L4-L5. Hyperlipidemia, venous insufficiency, basal cell carcinoma(2017), spondylolisthesis, Achilles tendon contracture( bilateral).finger amputation, ( L3-4 back surgery, L TKA, Obesity, R knee OA    Limitations  Walking;Standing;House hold activities    Patient Stated Goals  Pt would like to improve leg strength for work.    Currently in Pain?  No/denies                       [facility] Adult PT Treatment/Exercise - 04/06/18 1410  Transfers   Transfers  Sit to Stand;Stand to Sit    Sit to Stand  6: Modified independent (Device/Increase time);Without upper extremity assist;From chair/3-in-1    Stand to Sit  6: Modified independent (Device/Increase time);Without upper extremity assist;To chair/3-in-1      Ambulation/Gait   Stairs  Yes    Stairs Assistance  6: Modified independent (Device/Increase time)    Stair Management Technique  Alternating pattern;Forwards;One rail Right intermittent light touch on right rail    Number of Stairs  24 24 total (6 reps of 4 steps in PT clinic)        Access Code: [email or web address]  URL: [email or web address]  Date: 04/06/2018  Prepared by: Jamey Reas   Exercises  Standing Quadratus Lumborum Stretch with Doorway - 3 reps - 1 sets - 30 hold - 1x daily - 7x weekly  Sidelying Quadratus Lumborum Stretch on Table - 3  reps - 1 sets - 30 hold - 1x daily - 7x weekly  Seated Quadratus Lumborum Stretch with Arm Overhead - 3 reps - 1 sets - 30 hold - 1x daily - 7x weekly  Seated Quadratus Lumborum Stretch with Forward Bend - 3 reps - 1 sets - 30 hold - 1x daily - 7x weekly       PT Education - 04/06/18 1430    Education provided  Yes    Education Details  area of back pain that he points out pain is Quadratus Lumborum. PT used internet to educate on muscle. Added Quadratus Lumborum stretches (4 options) to HEP. posture /extension standing at door frame or back to counter for stretches to relief prolonged trunk flexion.     Person(s) Educated  Patient    Methods  Explanation;Demonstration;Verbal cues;Tactile cues;Handout    Comprehension  Verbalized understanding       PT Short Term Goals - 03/15/18 1607      PT SHORT TERM GOAL #1   Title  Patient will be Independent with initial HEP to facilitate progress during PT sessions(03/09/2018 target date for all goals)    Baseline  MET 03/05/2018    Status  Achieved      PT SHORT TERM GOAL #2   Title  Patient will demonstrate 5108f ambulation with LRAD Mod I(outdoors) with good foot clearance to improve muscular endurance     Baseline  03/15/18: pt able to achieve the distance with supervision, no AD. occasional scuffing noted on paved surfaces with no balance loss, minor veering.    Status  Partially Met      PT SHORT TERM GOAL #3   Title  Pt will demonstrate stair negotiating 16 steps (total) with single railing, at supervision level in order to simulate home set and address muscular endurance    Baseline  MET 03/05/2018    Status  Achieved      PT SHORT TERM GOAL #4   Title  Pt will demonstrate or verbalize methods used to reduce acute pain flare ups     Baseline  MET 03/05/2018    Status  Achieved        PT Long Term Goals - 04/06/18 1843      PT LONG TERM GOAL #1   Title  Pt will demonstrate stair negotiation 24 steps (total) mod I with one hand  rail  to simulate home environment and to address muscular endurance. (all LTGs target date 04/09/2018)    Baseline  MET 04/06/2018    Time  8    Period  Weeks  Status  Achieved      PT LONG TERM GOAL #2   Title  Pt will ambulate 1036f with LRAD at Mod I over grass and pavement in order to simulate home and work environment.     Time  8    Period  Weeks    Status  On-going    Target Date  04/09/18      PT LONG TERM GOAL #3   Title  Pt will demonstrate ability to pick up small objects from ankle, knee height (5-15#) with correct lifting mechanics simulating work related tasks    Time  8    Period  Weeks    Status  On-going    Target Date  04/09/18      PT LONG TERM GOAL #4   Title  Pt will be independent with ongoing HEP/Fitness program in order to maintain function & help control pain.     Baseline  MET 04/06/2018    Time  8    Period  Weeks    Status  Achieved            Plan - 04/06/18 1844    Clinical Impression Statement  Patient seems to understand PT instruction in back stretch for Quadratus Lumborum muscle and standing back extension / posture stretch after prolonged flexion with work activities.  He verbalizes understanding of ongoing HEP / fitness recommendation. He met 2 LTGs checked today with other 2 to be checked next session.     PT Frequency  2x / week    PT Duration  8 weeks    PT Treatment/Interventions  ADLs/Self Care Home Management;Biofeedback;Cryotherapy;Electrical Stimulation;Iontophoresis '4mg'$ /ml Dexamethasone;Moist Heat;Traction;Ultrasound;Gait training;Stair training;Functional mobility training;Therapeutic activities;Therapeutic exercise;Balance training;Neuromuscular re-education;Patient/family education;Orthotic Fit/Training;Compression bandaging;Passive range of motion;Energy conservation    PT Next Visit Plan  check remaining 2 LTGs and discharge.     Consulted and Agree with Plan of Care  Patient       Patient will benefit from skilled  therapeutic intervention in order to improve the following deficits and impairments:  Abnormal gait, Decreased skin integrity, Impaired sensation, Pain, Decreased mobility, Postural dysfunction, Decreased activity tolerance, Decreased range of motion, Decreased strength, Difficulty walking, Impaired flexibility, Obesity  Visit Diagnosis: Muscle weakness (generalized)  Other abnormalities of gait and mobility  Unsteadiness on feet  Abnormal posture     Problem List Patient Active Problem List   Diagnosis Date Noted  . Spinal stenosis of lumbar region with neurogenic claudication 01/15/2017  . Spondylolisthesis of lumbar region 01/15/2017  . Claw toe, acquired, left 01/06/2017  . Achilles tendon contracture, bilateral 01/06/2017  . Pain in metatarsus of both feet 01/06/2017  . Tubular adenoma of colon 12/29/2016  . Basal cell carcinoma 08/19/2016  . Umbilical hernia s/p lap repair with mesh 06/27/2016 06/27/2016  . Degenerative arthritis of knee 03/22/2015  . Dyspnea on exertion 01/31/2015  . Lumbar radiculopathy 12/12/2014  . Facet hypertrophy of lumbar region 12/12/2014  . Left knee pain 12/12/2014  . Ulcer of lower limb ([place] 01/10/2014  . Varicose veins of lower extremities with other complications 029/93/7169 . 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 Renal insufficiency 12/21/2013  . Swelling in head/neck 07/28/2012  . Venous (peripheral) insufficiency 05/25/2012  . 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 Pain in limb 02/12/2012  . Hyperlipidemia 02/28/2008  . Allergic rhinitis 02/28/2008  . Sleep apnea 02/28/2008    Dayon Witt PT, DPT 04/07/2018, 10:49 AM  [facility]  [place], [place], [address] Phone: [phone]   Fax:  [phone]  Name: [name] MRN: [id number] Date of Birth: [birth year]

## 2018-04-08 ENCOUNTER — Encounter: Payer: Self-pay | Admitting: Physical Therapy

## 2018-04-08 ENCOUNTER — Ambulatory Visit: Payer: Medicare Other | Admitting: Physical Therapy

## 2018-04-08 DIAGNOSIS — M5441 Lumbago with sciatica, right side: Secondary | ICD-10-CM | POA: Diagnosis not present

## 2018-04-08 DIAGNOSIS — M6281 Muscle weakness (generalized): Secondary | ICD-10-CM | POA: Diagnosis not present

## 2018-04-08 DIAGNOSIS — R2689 Other abnormalities of gait and mobility: Secondary | ICD-10-CM | POA: Diagnosis not present

## 2018-04-08 DIAGNOSIS — R2681 Unsteadiness on feet: Secondary | ICD-10-CM | POA: Diagnosis not present

## 2018-04-08 DIAGNOSIS — G8929 Other chronic pain: Secondary | ICD-10-CM

## 2018-04-08 DIAGNOSIS — R293 Abnormal posture: Secondary | ICD-10-CM | POA: Diagnosis not present

## 2018-04-08 NOTE — Therapy (Addendum)
[facility] [address] [place], [place], [address] Phone: [phone]   Fax:  [phone]  Physical Therapy Treatment  Patient Details  Name: [name] MRN: [id number] Date of Birth: [birth year] Referring Provider: Meredith Pel, MD    Encounter Date: 04/08/2018  PT End of Session - 04/08/18 1328    Visit Number  12    Number of Visits  17    Date for PT Re-Evaluation  04/06/18    PT Start Time  1324 pt late for session today    PT Stop Time  1400    PT Time Calculation (min)  36 min    Equipment Utilized During Treatment  Gait belt    Activity Tolerance  Patient tolerated treatment well    Behavior During Therapy  [facility] for tasks assessed/performed       Past Medical History:  Diagnosis Date  . Allergy    takes Zyrtec daily  . Anxiety    takes Ativan daily as needed  . Arthritis   . Back pain   . BPH (benign prostatic hyperplasia)    takes doxazosin for  . 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 Depression    takes Wellbutrin daily  . Dyspnea on exertion    with exertion  . GERD (gastroesophageal reflux disease)    takes Nexium and Omeprazole daily  . History of kidney stones   . 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 Hyperlipidemia   . Neuromuscular disorder ([place])   . Sleep apnea   . Swelling of lower extremity    right more than leg leg  . Umbilical hernia     Past Surgical History:  Procedure Laterality Date  . 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 BACK SURGERY  2003   L4, L5  . BACK SURGERY    . ENDOVENOUS ABLATION SAPHENOUS VEIN W/ LASER Right 06-01-2014   EVLA right greater saphenous vein by Curt Jews MD   . epidural steroid injections        [facility] dr [place]  . hernia repair  2003  . HERNIA REPAIR     hernia inguinal x 2  . TOTAL KNEE ARTHROPLASTY Left 03/22/2015   Procedure: TOTAL KNEE ARTHROPLASTY;  Surgeon: Meredith Pel, MD;  Location: [place];  Service: Orthopedics;  Laterality: Left;  . UMBILICAL HERNIA REPAIR N/A 06/27/2016   Procedure: LAPAROSCOPIC ASSISTED REPAIR OF UMBILICAL HERINA WITH MESH;  Surgeon: Johnathan Hausen, MD;  Location: [facility];  Service: General;  Laterality: N/A;  . VASCULAR SURGERY  2015   right leg    There were no vitals filed for this visit.  Subjective Assessment - 04/08/18 1327    Subjective  No new complaints.  No falls. Has been doing the new stretches issued at last session.     Pertinent History  MRI 08/07/2017)DDD(L2-L3 and L3-L4)moderate Spinal stenosis. L2-L3 neural foramina stenosis. severe spinal canal stenosis and bilateral neural foramina stenosis L4-L5. Hyperlipidemia, venous insufficiency, basal cell carcinoma(2017), spondylolisthesis, Achilles tendon contracture( bilateral).finger amputation, ( L3-4 back surgery, L TKA, Obesity, R knee OA    Limitations  Walking;Standing;House hold activities    Patient Stated Goals  Pt would like to improve leg strength for work.    Currently in Pain?  No/denies    Pain Score  0-No pain           [facility]  Adult PT Treatment/Exercise - 04/08/18 1331      Transfers   Transfers  Sit to Stand;Stand to Sit    Sit to Stand  6: Modified independent (Device/Increase time);Without upper extremity assist;From chair/3-in-1    Stand to Sit  6: Modified independent (Device/Increase time);Without upper extremity assist;To chair/3-in-1      Ambulation/Gait   Ambulation/Gait  Yes    Ambulation/Gait Assistance  6: Modified independent (Device/Increase time)    Ambulation Distance (Feet)  1000 Feet    Assistive device  None    Gait Pattern  Step-through pattern    Ambulation Surface  Level;Unlevel;Indoor;Outdoor;Paved;Grass      Therapeutic Activites    Therapeutic Activities  Lifting    Lifting  pt educated with demo by PTA on proper lifting from floor, knee high, waist high and carrying of a loaded crate on level surfaces and  stairs. Pt was able to return demo of all of this during session.                      PT Short Term Goals - 03/15/18 1607      PT SHORT TERM GOAL #1   Title  Patient will be Independent with initial HEP to facilitate progress during PT sessions(03/09/2018 target date for all goals)    Baseline  MET 03/05/2018    Status  Achieved      PT SHORT TERM GOAL #2   Title  Patient will demonstrate 574f ambulation with LRAD Mod I(outdoors) with good foot clearance to improve muscular endurance     Baseline  03/15/18: pt able to achieve the distance with supervision, no AD. occasional scuffing noted on paved surfaces with no balance loss, minor veering.    Status  Partially Met      PT SHORT TERM GOAL #3   Title  Pt will demonstrate stair negotiating 16 steps (total) with single railing, at supervision level in order to simulate home set and address muscular endurance    Baseline  MET 03/05/2018    Status  Achieved      PT SHORT TERM GOAL #4   Title  Pt will demonstrate or verbalize methods used to reduce acute pain flare ups     Baseline  MET 03/05/2018    Status  Achieved        PT Long Term Goals - 04/08/18 1329      PT LONG TERM GOAL #1   Title  Pt will demonstrate stair negotiation 24 steps (total) mod I with one hand rail  to simulate home environment and to address muscular endurance. (all LTGs target date 04/09/2018)    Baseline  MET 04/06/2018    Status  Achieved      PT LONG TERM GOAL #2   Title  Pt will ambulate 10072fwith LRAD at Mod I over grass and pavement in order to simulate home and work environment.     Baseline  04/08/18: met today    Time  --    Period  --    Status  Achieved      PT LONG TERM GOAL #3   Title  Pt will demonstrate ability to pick up small objects from ankle, knee height (5-15#) with correct lifting mechanics simulating work related tasks    Baseline  04/08/18: met today    Time  --    Period  --    Status  Achieved      PT LONG TERM GOAL #4  Title  Pt will be independent with ongoing HEP/Fitness program in order to maintain function & help control pain.     Baseline  MET 04/06/2018    Status  Achieved         Plan - 04/08/18 1329    Clinical Impression Statement  Today's skilled session focused on checking remaining LTGs with both goals met. No issues reported with session. Pt agreeable to discharge today.     PT Frequency  2x / week    PT Duration  8 weeks    PT Treatment/Interventions  ADLs/Self Care Home Management;Biofeedback;Cryotherapy;Electrical Stimulation;Iontophoresis '4mg'$ /ml Dexamethasone;Moist Heat;Traction;Ultrasound;Gait training;Stair training;Functional mobility training;Therapeutic activities;Therapeutic exercise;Balance training;Neuromuscular re-education;Patient/family education;Orthotic Fit/Training;Compression bandaging;Passive range of motion;Energy conservation    PT Next Visit Plan  discharge per PT plan of care    Consulted and Agree with Plan of Care  Patient       Patient will benefit from skilled therapeutic intervention in order to improve the following deficits and impairments:  Abnormal gait, Decreased skin integrity, Impaired sensation, Pain, Decreased mobility, Postural dysfunction, Decreased activity tolerance, Decreased range of motion, Decreased strength, Difficulty walking, Impaired flexibility, Obesity  Visit Diagnosis: Muscle weakness (generalized)  Other abnormalities of gait and mobility  Unsteadiness on feet  Chronic bilateral low back pain with right-sided sciatica     Problem List Patient Active Problem List   Diagnosis Date Noted  . Spinal stenosis of lumbar region with neurogenic claudication 01/15/2017  . Spondylolisthesis of lumbar region 01/15/2017  . Claw toe, acquired, left 01/06/2017  . Achilles tendon contracture, bilateral 01/06/2017  . Pain in metatarsus of both feet 01/06/2017  . Tubular adenoma of colon 12/29/2016  . Basal cell carcinoma 08/19/2016  .  Umbilical hernia s/p lap repair with mesh 06/27/2016 06/27/2016  . Degenerative arthritis of knee 03/22/2015  . Dyspnea on exertion 01/31/2015  . Lumbar radiculopathy 12/12/2014  . Facet hypertrophy of lumbar region 12/12/2014  . Left knee pain 12/12/2014  . Ulcer of lower limb ([place]) 01/10/2014  . Varicose veins of lower extremities with other complications 17/61/6073  . 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 Renal insufficiency 12/21/2013  . Swelling in head/neck 07/28/2012  . Venous (peripheral) insufficiency 05/25/2012  . 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 Pain in limb 02/12/2012  . Hyperlipidemia 02/28/2008  . Allergic rhinitis 02/28/2008  . Sleep apnea 02/28/2008    Willow Ora, PTA, [facility] [address] [place], [place] [address] [phone] 04/08/18, 10:34 PM   Name: [name] MRN: [id number] Date of Birth: 1941/10/29  PHYSICAL THERAPY DISCHARGE SUMMARY  Visits from Start of Care: 12  Current functional level related to goals / functional outcomes: PT Long Term Goals - 04/08/18 1329      PT LONG TERM GOAL #1   Title  Pt will demonstrate stair negotiation 24 steps (total) mod I with one hand rail  to simulate home environment and to address muscular endurance. (all LTGs target date 04/09/2018)    Baseline  MET 04/06/2018    Status  Achieved      PT LONG TERM GOAL #2   Title  Pt will ambulate 1035f with LRAD at Mod I over grass and pavement in order to simulate home and work environment.     Baseline  04/08/18: met today    Time  --    Period  --    Status  Achieved      PT LONG TERM GOAL #3  Title  Pt will demonstrate ability to pick up small objects from ankle, knee height (5-15#) with correct lifting mechanics simulating work related tasks    Baseline  04/08/18: met today    Time  --    Period  --    Status  Achieved      PT LONG TERM GOAL #4   Title  Pt will be independent with  ongoing HEP/Fitness program in order to maintain function & help control pain.     Baseline  MET 04/06/2018    Status  Achieved        Remaining deficits: Arthritic pain limits activity tolerance. He has impaired flexibility & flexed posture but is aware of HEP.   Education / Equipment: HEP   Plan: Patient agrees to discharge.  Patient goals were met. Patient is being discharged due to meeting the stated rehab goals.  ?????         Jamey Reas, PT, DPT PT Specializing in [place] 04/12/18 10:50 AM Phone:  [phone]  Fax:  [phone] [facility] [address] [place], [place] [address]

## 2018-04-26 ENCOUNTER — Telehealth (INDEPENDENT_AMBULATORY_CARE_PROVIDER_SITE_OTHER): Payer: Self-pay | Admitting: *Deleted

## 2018-04-26 NOTE — Telephone Encounter (Signed)
blateral vs right L4 TF esi

## 2018-04-29 DIAGNOSIS — R600 Localized edema: Secondary | ICD-10-CM | POA: Diagnosis not present

## 2018-04-29 DIAGNOSIS — R1031 Right lower quadrant pain: Secondary | ICD-10-CM | POA: Diagnosis not present

## 2018-04-29 DIAGNOSIS — M62830 Muscle spasm of back: Secondary | ICD-10-CM | POA: Diagnosis not present

## 2018-04-29 DIAGNOSIS — M48062 Spinal stenosis, lumbar region with neurogenic claudication: Secondary | ICD-10-CM | POA: Diagnosis not present

## 2018-05-19 DIAGNOSIS — R948 Abnormal results of function studies of other organs and systems: Secondary | ICD-10-CM | POA: Diagnosis not present

## 2018-05-19 DIAGNOSIS — E291 Testicular hypofunction: Secondary | ICD-10-CM | POA: Diagnosis not present

## 2018-05-25 ENCOUNTER — Encounter (INDEPENDENT_AMBULATORY_CARE_PROVIDER_SITE_OTHER): Payer: Self-pay | Admitting: Physical Medicine and Rehabilitation

## 2018-05-25 ENCOUNTER — Ambulatory Visit (INDEPENDENT_AMBULATORY_CARE_PROVIDER_SITE_OTHER): Payer: Medicare Other | Admitting: Physical Medicine and Rehabilitation

## 2018-05-25 ENCOUNTER — Ambulatory Visit (INDEPENDENT_AMBULATORY_CARE_PROVIDER_SITE_OTHER): Payer: Self-pay

## 2018-05-25 VITALS — BP 153/86 | HR 77

## 2018-05-25 DIAGNOSIS — M5416 Radiculopathy, lumbar region: Secondary | ICD-10-CM | POA: Diagnosis not present

## 2018-05-25 DIAGNOSIS — M4316 Spondylolisthesis, lumbar region: Secondary | ICD-10-CM

## 2018-05-25 DIAGNOSIS — M48062 Spinal stenosis, lumbar region with neurogenic claudication: Secondary | ICD-10-CM

## 2018-05-25 MED ORDER — BETAMETHASONE SOD PHOS & ACET 6 (3-3) MG/ML IJ SUSP
12.0000 mg | Freq: Once | INTRAMUSCULAR | Status: AC
  Administered 2018-05-25: 12 mg

## 2018-05-25 NOTE — Progress Notes (Signed)
 .  Numeric Pain Rating Scale and Functional Assessment Average Pain 8   In the last MONTH (on 0-10 scale) has pain interfered with the following?  1. General activity like being  able to carry out your everyday physical activities such as walking, climbing stairs, carrying groceries, or moving a chair?  Rating(7)   +Driver, -BT, -Dye Allergies.  

## 2018-05-25 NOTE — Patient Instructions (Signed)

## 2018-05-27 DIAGNOSIS — R972 Elevated prostate specific antigen [PSA]: Secondary | ICD-10-CM | POA: Diagnosis not present

## 2018-05-27 DIAGNOSIS — R3915 Urgency of urination: Secondary | ICD-10-CM | POA: Diagnosis not present

## 2018-05-27 DIAGNOSIS — N5201 Erectile dysfunction due to arterial insufficiency: Secondary | ICD-10-CM | POA: Diagnosis not present

## 2018-05-27 DIAGNOSIS — N401 Enlarged prostate with lower urinary tract symptoms: Secondary | ICD-10-CM | POA: Diagnosis not present

## 2018-05-27 DIAGNOSIS — E291 Testicular hypofunction: Secondary | ICD-10-CM | POA: Diagnosis not present

## 2018-06-07 NOTE — Procedures (Signed)
Lumbosacral Transforaminal Epidural Steroid Injection - Sub-Pedicular Approach with Fluoroscopic Guidance  Patient: [name]      Date of Birth: December 29, 1940 MRN: [id number] PCP: Dineen Kid, MD      Visit Date: 05/25/2018   Universal Protocol:    Date/Time: 05/25/2018  Consent Given By: the patient  Position: PRONE  Additional Comments: Vital signs were monitored before and after the procedure. Patient was prepped and draped in the usual sterile fashion. The correct patient, procedure, and site was verified.   Injection Procedure Details:  Procedure Site One Meds Administered:  Meds ordered this encounter  Medications  . betamethasone acetate-betamethasone sodium phosphate (CELESTONE) injection 12 mg    Laterality: Right  Location/Site:  L3-L4  Needle size: 22 G  Needle type: Spinal  Needle Placement: Transforaminal  Findings:    -Comments: Excellent flow of contrast along the nerve and into the epidural space.  Procedure Details: After squaring off the end-plates to get a true AP view, the C-arm was positioned so that an oblique view of the foramen as noted above was visualized. The target area is just inferior to the "nose of the scotty dog" or sub pedicular. The soft tissues overlying this structure were infiltrated with 2-3 ml. of 1% Lidocaine without Epinephrine.  The spinal needle was inserted toward the target using a "trajectory" view along the fluoroscope beam.  Under AP and lateral visualization, the needle was advanced so it did not puncture dura and was located close the 6 O'Clock position of the pedical in AP tracterory. Biplanar projections were used to confirm position. Aspiration was confirmed to be negative for CSF and/or blood. A 1-2 ml. volume of Isovue-250 was injected and flow of contrast was noted at each level. Radiographs were obtained for documentation purposes.   After attaining the desired flow of contrast documented above, a 0.5 to 1.0  ml test dose of 0.25% Marcaine was injected into each respective transforaminal space.  The patient was observed for 90 seconds post injection.  After no sensory deficits were reported, and normal lower extremity motor function was noted,   the above injectate was administered so that equal amounts of the injectate were placed at each foramen (level) into the transforaminal epidural space.   Additional Comments:  The patient tolerated the procedure well Dressing: Band-Aid    Post-procedure details: Patient was observed during the procedure. Post-procedure instructions were reviewed.  Patient left the clinic in stable condition.

## 2018-06-07 NOTE — Progress Notes (Signed)
[name] - 77 y.o. male MRN [id number]  Date of birth: May 26, 1941  Office Visit Note: Visit Date: 05/25/2018 PCP: Dineen Kid, MD Referred by: No ref. provider found  Subjective: Chief Complaint  Patient presents with  . Lower Back - Pain  . Right Leg - Numbness   HPI: Mr. [name] is a 77 year old gentleman that returns today with continued chronic severe recalcitrant lower back and hip and leg pain with paresthesia.  His pain mostly is on the right side at this point referring down the leg and more of an L4 distribution.  He has known listhesis of L4 on L5 with pretty severe multifactorial stenosis at this level.  Last time I saw him was in April and we completed facet joint block which was really ineffective.  We did try that because previous epidural did not help as well.  He has had intermittent results with epidural injection with him sometimes helping for several months.  I think it is wise to go ahead today and try an L3 injection above the level of severe stenosis as he does have stenosis at L3-4 as well and may be getting medication at that level will do more good than the very tight spot.  He has sought out spine surgery evaluation through Dr. Louanne Skye.  He may wish to look at a neurosurgeon if possible.  He has other health concerns however.  I am not sure how he would do with spinal cord stimulator trial but that is an option.   ROS Otherwise per HPI.  Assessment & Plan: Visit Diagnoses:  1. Lumbar radiculopathy   2. Spinal stenosis of lumbar region with neurogenic claudication   3. Spondylolisthesis of lumbar region     Plan: No additional findings.   Meds & Orders:  Meds ordered this encounter  Medications  . betamethasone acetate-betamethasone sodium phosphate (CELESTONE) injection 12 mg    Orders Placed This Encounter  Procedures  . XR C-ARM NO REPORT  . Epidural Steroid injection    Follow-up: Return if symptoms worsen or fail to improve.   Procedures: No  procedures performed  Lumbosacral Transforaminal Epidural Steroid Injection - Sub-Pedicular Approach with Fluoroscopic Guidance  Patient: [name]      Date of Birth: 19-Feb-1941 MRN: [id number] PCP: Dineen Kid, MD      Visit Date: 05/25/2018   Universal Protocol:    Date/Time: 05/25/2018  Consent Given By: the patient  Position: PRONE  Additional Comments: Vital signs were monitored before and after the procedure. Patient was prepped and draped in the usual sterile fashion. The correct patient, procedure, and site was verified.   Injection Procedure Details:  Procedure Site One Meds Administered:  Meds ordered this encounter  Medications  . betamethasone acetate-betamethasone sodium phosphate (CELESTONE) injection 12 mg    Laterality: Right  Location/Site:  L3-L4  Needle size: 22 G  Needle type: Spinal  Needle Placement: Transforaminal  Findings:    -Comments: Excellent flow of contrast along the nerve and into the epidural space.  Procedure Details: After squaring off the end-plates to get a true AP view, the C-arm was positioned so that an oblique view of the foramen as noted above was visualized. The target area is just inferior to the "nose of the scotty dog" or sub pedicular. The soft tissues overlying this structure were infiltrated with 2-3 ml. of 1% Lidocaine without Epinephrine.  The spinal needle was inserted toward the target using a "trajectory" view along the fluoroscope beam.  Under AP and lateral visualization, the needle was advanced so it did not puncture dura and was located close the 6 O'Clock position of the pedical in AP tracterory. Biplanar projections were used to confirm position. Aspiration was confirmed to be negative for CSF and/or blood. A 1-2 ml. volume of Isovue-250 was injected and flow of contrast was noted at each level. Radiographs were obtained for documentation purposes.   After attaining the desired flow of contrast  documented above, a 0.5 to 1.0 ml test dose of 0.25% Marcaine was injected into each respective transforaminal space.  The patient was observed for 90 seconds post injection.  After no sensory deficits were reported, and normal lower extremity motor function was noted,   the above injectate was administered so that equal amounts of the injectate were placed at each foramen (level) into the transforaminal epidural space.   Additional Comments:  The patient tolerated the procedure well Dressing: Band-Aid    Post-procedure details: Patient was observed during the procedure. Post-procedure instructions were reviewed.  Patient left the clinic in stable condition.     Clinical History: Lumbar Spine MRI 2014 L3-4:  A diffuse bulging annulus, short pedicles and facet disease contribute to moderate spinal and bilateral lateral recess stenosis and mild bilateral foraminal stenosis.  This appears relatively stable.  L4-5:  Degenerative anterolisthesis of L4 with a bulging uncovered disc, short pedicles and severe facet disease contributing to moderately severe spinal and bilateral lateral recess stenosis and moderately severe bilateral foraminal stenosis.  These findings relatively stable.  L5-S1:  Persistent focal central disc protrusion and severe facet disease contributing to spinal and lateral recess stenosis with persistent mass effect on the right S1 nerve root.  No significant foraminal stenosis.  IMPRESSION: Severe degenerative lumbar spondylosis with multilevel multifactorial spinal, lateral recess and foraminal stenosis as specifically discussed above at the individual levels.  No significant interval change when compared the prior study.  Cervical MRI 08/30/2016 IMPRESSION: Mild progressive degenerative changes when compared to prior exam. Summary of pertinent findings includes:  C1-2 degenerative changes with small amount of fluid right C1-2 lateral mass  articulation.  C2-3 no significant spinal stenosis or foraminal narrowing.  C3-4 multifactorial mild to moderate foraminal narrowing greater on the left. Vertebral arteries extends into neural foramen. Spur posterior superior aspect of the C4 vertebra greatest left paracentral position. Mild indentation ventral thecal sac and minimal left-sided cord contact.  C4-5 broad-based disc osteophyte complex greater to the right. Spinal stenosis and cord flattening greater on the right. Multifactorial marked bilateral foraminal narrowing greater on the right.  C5-6 broad-based disc osteophyte complex greater to the right. Mild right-sided cord flattening. Multifactorial mild to moderate right foraminal narrowing.  C6-7 broad-based disc osteophyte complex slightly greater to left. Ventral thecal sac narrowing with minimal left-sided cord contact. Multifactorial marked bilateral foraminal narrowing.  C7-T1 bulge and spur with narrowing ventral thecal sac. Moderate bilateral foraminal narrowing.   He reports that he has never smoked. He has never used smokeless tobacco.  Recent Labs    01/15/18 1432  LABURIC 7.0    Objective:  VS:  HT:    WT:   BMI:     BP:(!) 153/86  HR:77bpm  TEMP: ( )  RESP:  Physical Exam  Ortho Exam Imaging: No results found.  Past Medical/Family/Surgical/Social History: Medications & Allergies reviewed per EMR, new medications updated. Patient Active Problem List   Diagnosis Date Noted  . Spinal stenosis of lumbar region with neurogenic claudication 01/15/2017  .  Spondylolisthesis of lumbar region 01/15/2017  . Claw toe, acquired, left 01/06/2017  . Achilles tendon contracture, bilateral 01/06/2017  . Pain in metatarsus of both feet 01/06/2017  . Tubular adenoma of colon 12/29/2016  . Basal cell carcinoma 08/19/2016  . Umbilical hernia s/p lap repair with mesh 06/27/2016 06/27/2016  . Degenerative arthritis of knee 03/22/2015  . Dyspnea on exertion  01/31/2015  . Lumbar radiculopathy 12/12/2014  . Facet hypertrophy of lumbar region 12/12/2014  . Left knee pain 12/12/2014  . Ulcer of lower limb ([place]) 01/10/2014  . Varicose veins of lower extremities with other complications 16/57/9038  . 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 Renal insufficiency 12/21/2013  . Swelling in head/neck 07/28/2012  . Venous (peripheral) insufficiency 05/25/2012  . 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 Pain in limb 02/12/2012  . Hyperlipidemia 02/28/2008  . Allergic rhinitis 02/28/2008  . Sleep apnea 02/28/2008   Past Medical History:  Diagnosis Date  . Allergy    takes Zyrtec daily  . Anxiety    takes Ativan daily as needed  . Arthritis   . Back pain   . BPH (benign prostatic hyperplasia)    takes doxazosin for  . 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 Depression    takes Wellbutrin daily  . Dyspnea on exertion    with exertion  . GERD (gastroesophageal reflux disease)    takes Nexium and Omeprazole daily  . History of kidney stones   . 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 Hyperlipidemia   . Neuromuscular disorder ([place])   . Sleep apnea   . Swelling of lower extremity    right more than leg leg  . Umbilical hernia    Family History  Problem Relation Age of Onset  . Thyroid disease Mother   . Heart disease Father   . Diabetes Maternal Grandfather    Past Surgical History:  Procedure Laterality Date  . 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 BACK SURGERY  2003   L4, L5  . BACK SURGERY    . ENDOVENOUS ABLATION SAPHENOUS VEIN W/ LASER Right 06-01-2014   EVLA right greater saphenous vein by Curt Jews MD   . epidural steroid injections        [facility] dr Shontia Gillooly  . hernia repair  2003  . HERNIA REPAIR     hernia inguinal x 2  . TOTAL KNEE ARTHROPLASTY Left 03/22/2015   Procedure: TOTAL KNEE ARTHROPLASTY;  Surgeon: Meredith Pel, MD;  Location:  [facility] [name];  Service: Orthopedics;  Laterality: Left;  . UMBILICAL HERNIA REPAIR N/A 06/27/2016   Procedure: LAPAROSCOPIC ASSISTED REPAIR OF UMBILICAL HERINA WITH MESH;  Surgeon: Johnathan Hausen, MD;  Location: [facility];  Service: General;  Laterality: N/A;  . VASCULAR SURGERY  2015   right leg   Social History   Occupational History  . 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 Smoking status: Never Smoker  . Smokeless tobacco: Never Used  Substance and Sexual Activity  . Alcohol use: No  . Drug use: No  . Sexual activity: Yes    Birth control/protection: Condom

## 2018-06-08 DIAGNOSIS — L97211 Non-pressure chronic ulcer of right calf limited to breakdown of skin: Secondary | ICD-10-CM | POA: Diagnosis not present

## 2018-06-14 DIAGNOSIS — S0340XA Sprain of jaw, unspecified side, initial encounter: Secondary | ICD-10-CM | POA: Diagnosis not present

## 2018-06-16 DIAGNOSIS — I8311 Varicose veins of right lower extremity with inflammation: Secondary | ICD-10-CM | POA: Diagnosis not present

## 2018-06-23 DIAGNOSIS — L97211 Non-pressure chronic ulcer of right calf limited to breakdown of skin: Secondary | ICD-10-CM | POA: Diagnosis not present

## 2018-06-23 DIAGNOSIS — I83892 Varicose veins of left lower extremities with other complications: Secondary | ICD-10-CM | POA: Diagnosis not present

## 2018-07-13 DIAGNOSIS — R600 Localized edema: Secondary | ICD-10-CM | POA: Diagnosis not present

## 2018-07-19 DIAGNOSIS — R3915 Urgency of urination: Secondary | ICD-10-CM | POA: Diagnosis not present

## 2018-07-19 DIAGNOSIS — N401 Enlarged prostate with lower urinary tract symptoms: Secondary | ICD-10-CM | POA: Diagnosis not present

## 2018-07-23 ENCOUNTER — Encounter (INDEPENDENT_AMBULATORY_CARE_PROVIDER_SITE_OTHER): Payer: Self-pay | Admitting: Family Medicine

## 2018-07-23 ENCOUNTER — Ambulatory Visit (INDEPENDENT_AMBULATORY_CARE_PROVIDER_SITE_OTHER): Payer: Medicare Other

## 2018-07-23 ENCOUNTER — Ambulatory Visit (INDEPENDENT_AMBULATORY_CARE_PROVIDER_SITE_OTHER): Payer: Medicare Other | Admitting: Family Medicine

## 2018-07-23 DIAGNOSIS — M25511 Pain in right shoulder: Secondary | ICD-10-CM

## 2018-07-23 DIAGNOSIS — H6980 Other specified disorders of Eustachian tube, unspecified ear: Secondary | ICD-10-CM | POA: Diagnosis not present

## 2018-07-23 DIAGNOSIS — J0110 Acute frontal sinusitis, unspecified: Secondary | ICD-10-CM | POA: Diagnosis not present

## 2018-07-23 MED ORDER — ETODOLAC 400 MG PO TABS: 400.0000 mg | ORAL_TABLET | Freq: Two times a day (BID) | ORAL | 3 refills | Status: DC | PRN

## 2018-07-23 NOTE — Progress Notes (Signed)
Office Visit Note   Patient: [name]           Date of Birth: June 12, 1941           MRN: [id number] Visit Date: 07/23/2018 Requested by: Dineen Kid, MD [address], [place] [id number] PCP: Dineen Kid, MD  Subjective: Chief Complaint  Patient presents with  . Right Upper Arm - Pain    Pain x approximately 5 days.  Remembers a sharp pain after lifting something heavy "with a jerk." Wakes him from sleep.    HPI: He is a 77 year old right-hand-dominant male with right shoulder pain.  About 4 or 5 days ago he was pushing a piece of furniture and felt a sudden pain in the biceps area.  He has had severe pain with certain movements since then.  Pain radiates toward the shoulder and to the mid biceps area.  Meloxicam does not seem to be helping.               Objective: Vital Signs: There were no vitals taken for this visit.  Physical Exam:  Right shoulder: No Popeye deformity.  Tender near the long head biceps tendon and in the biceps muscle belly.  Isometric rotator cuff strength is still 5/5 with minimal pain.  Full active range of motion of the shoulder.  Imaging: X-rays of the right shoulder: Mild glenohumeral arthritis, mild/moderate AC joint DJD with inferior spurring of the distal clavicle.  No sign of fracture or other acute abnormality.  Assessment & Plan: 1.  Right shoulder pain, possible strain of biceps muscle.  Cannot rule out subscapularis tear or partial biceps tear. -We will try a different anti-inflammatory.  Range of motion exercises at home to prevent stiffness.  Could contemplate physical therapy if symptoms persist, or MRI scan to look for rotator cuff tear if severe pain persists.   Follow-Up Instructions: No follow-ups on file.       Procedures: None   PMFS History: Patient Active Problem List   Diagnosis Date Noted  . Spinal stenosis of lumbar region with neurogenic claudication 01/15/2017  . Spondylolisthesis of lumbar region  01/15/2017  . Claw toe, acquired, left 01/06/2017  . Achilles tendon contracture, bilateral 01/06/2017  . Pain in metatarsus of both feet 01/06/2017  . Tubular adenoma of colon 12/29/2016  . Basal cell carcinoma 08/19/2016  . Umbilical hernia s/p lap repair with mesh 06/27/2016 06/27/2016  . Degenerative arthritis of knee 03/22/2015  . Dyspnea on exertion 01/31/2015  . Lumbar radiculopathy 12/12/2014  . Facet hypertrophy of lumbar region 12/12/2014  . Left knee pain 12/12/2014  . Ulcer of lower limb ([place]) 01/10/2014  . Varicose veins of lower extremities with other complications 19/14/7829  . 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 Renal insufficiency 12/21/2013  . Swelling in head/neck 07/28/2012  . Venous (peripheral) insufficiency 05/25/2012  . 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 Pain in limb 02/12/2012  . Hyperlipidemia 02/28/2008  . Allergic rhinitis 02/28/2008  . Sleep apnea 02/28/2008   Past Medical History:  Diagnosis Date  . Allergy    takes Zyrtec daily  . Anxiety    takes Ativan daily as needed  . Arthritis   . Back pain   . BPH (benign prostatic hyperplasia)    takes doxazosin for  . 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 Depression    takes Wellbutrin daily  . Dyspnea on exertion    with exertion  .  GERD (gastroesophageal reflux disease)    takes Nexium and Omeprazole daily  . History of kidney stones   . 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 Hyperlipidemia   . Neuromuscular disorder ([place])   . Sleep apnea   . Swelling of lower extremity    right more than leg leg  . Umbilical hernia     Family History  Problem Relation Age of Onset  . Thyroid disease Mother   . Heart disease Father   . Diabetes Maternal Grandfather     Past Surgical History:  Procedure Laterality Date  . 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 BACK SURGERY  2003   L4, L5  . BACK SURGERY    .  ENDOVENOUS ABLATION SAPHENOUS VEIN W/ LASER Right 06-01-2014   EVLA right greater saphenous vein by Curt Jews MD   . epidural steroid injections        [facility] dr [place]ton  . hernia repair  2003  . HERNIA REPAIR     hernia inguinal x 2  . TOTAL KNEE ARTHROPLASTY Left 03/22/2015   Procedure: TOTAL KNEE ARTHROPLASTY;  Surgeon: Meredith Pel, MD;  Location: [facility];  Service: Orthopedics;  Laterality: Left;  . UMBILICAL HERNIA REPAIR N/A 06/27/2016   Procedure: LAPAROSCOPIC ASSISTED REPAIR OF UMBILICAL HERINA WITH MESH;  Surgeon: Johnathan Hausen, MD;  Location: [facility];  Service: General;  Laterality: N/A;  . VASCULAR SURGERY  2015   right leg   Social History   Occupational History  . 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 Smoking status: Never Smoker  . Smokeless tobacco: Never Used  Substance and Sexual Activity  . Alcohol use: No  . Drug use: No  . Sexual activity: Yes    Birth control/protection: Condom

## 2018-08-02 DIAGNOSIS — J45909 Unspecified asthma, uncomplicated: Secondary | ICD-10-CM | POA: Diagnosis not present

## 2018-08-04 DIAGNOSIS — J209 Acute bronchitis, unspecified: Secondary | ICD-10-CM | POA: Diagnosis not present

## 2018-08-06 DIAGNOSIS — L97211 Non-pressure chronic ulcer of right calf limited to breakdown of skin: Secondary | ICD-10-CM | POA: Diagnosis not present

## 2018-08-06 DIAGNOSIS — I83892 Varicose veins of left lower extremities with other complications: Secondary | ICD-10-CM | POA: Diagnosis not present

## 2018-08-06 DIAGNOSIS — I8312 Varicose veins of left lower extremity with inflammation: Secondary | ICD-10-CM | POA: Diagnosis not present

## 2018-08-12 DIAGNOSIS — L97221 Non-pressure chronic ulcer of left calf limited to breakdown of skin: Secondary | ICD-10-CM | POA: Diagnosis not present

## 2018-08-13 ENCOUNTER — Other Ambulatory Visit: Payer: Self-pay | Admitting: Family Medicine

## 2018-08-13 ENCOUNTER — Ambulatory Visit
Admission: RE | Admit: 2018-08-13 | Discharge: 2018-08-13 | Disposition: A | Discharge status: Home / Self Care | Payer: Medicare Other | Source: Ambulatory Visit | Attending: Family Medicine | Admitting: Family Medicine

## 2018-08-13 DIAGNOSIS — R0602 Shortness of breath: Secondary | ICD-10-CM | POA: Diagnosis not present

## 2018-08-13 DIAGNOSIS — R065 Mouth breathing: Secondary | ICD-10-CM | POA: Diagnosis not present

## 2018-08-13 DIAGNOSIS — R059 Cough, unspecified: Secondary | ICD-10-CM

## 2018-08-13 DIAGNOSIS — M48062 Spinal stenosis, lumbar region with neurogenic claudication: Secondary | ICD-10-CM | POA: Diagnosis not present

## 2018-08-13 DIAGNOSIS — R05 Cough: Secondary | ICD-10-CM

## 2018-08-13 MED ORDER — IOPAMIDOL (ISOVUE-370) INJECTION 76%
75.0000 mL | Freq: Once | INTRAVENOUS | Status: AC | PRN
  Administered 2018-08-13: 75 mL via INTRAVENOUS

## 2018-08-17 DIAGNOSIS — D229 Melanocytic nevi, unspecified: Secondary | ICD-10-CM | POA: Diagnosis not present

## 2018-08-17 DIAGNOSIS — L821 Other seborrheic keratosis: Secondary | ICD-10-CM | POA: Diagnosis not present

## 2018-08-17 DIAGNOSIS — L570 Actinic keratosis: Secondary | ICD-10-CM | POA: Diagnosis not present

## 2018-08-17 DIAGNOSIS — D485 Neoplasm of uncertain behavior of skin: Secondary | ICD-10-CM | POA: Diagnosis not present

## 2018-08-27 DIAGNOSIS — I83892 Varicose veins of left lower extremities with other complications: Secondary | ICD-10-CM | POA: Diagnosis not present

## 2018-08-27 DIAGNOSIS — Z23 Encounter for immunization: Secondary | ICD-10-CM | POA: Diagnosis not present

## 2018-08-27 DIAGNOSIS — I8312 Varicose veins of left lower extremity with inflammation: Secondary | ICD-10-CM | POA: Diagnosis not present

## 2018-09-09 ENCOUNTER — Other Ambulatory Visit (INDEPENDENT_AMBULATORY_CARE_PROVIDER_SITE_OTHER): Payer: Self-pay | Admitting: Physical Medicine and Rehabilitation

## 2018-09-09 ENCOUNTER — Telehealth (INDEPENDENT_AMBULATORY_CARE_PROVIDER_SITE_OTHER): Payer: Self-pay | Admitting: Radiology

## 2018-09-09 MED ORDER — ACETAMINOPHEN-CODEINE #3 300-30 MG PO TABS: 1.0000 | ORAL_TABLET | Freq: Three times a day (TID) | ORAL | 0 refills | Status: AC | PRN

## 2018-09-09 NOTE — Telephone Encounter (Signed)
Patient on schedule Tues. 09/21/18 at 2:30 PM. Requests to be called if sooner appointment is available.  Rt L3-L4 TF

## 2018-09-09 NOTE — Telephone Encounter (Signed)
Patient left message requesting another injection. Last injection worked great. 05/25/18 right L3-L4 TF. Increased pain x 5 days.  Ok to repeat?  Call # [phone]

## 2018-09-09 NOTE — Progress Notes (Signed)
Mild pain med acute pain until injection.

## 2018-09-09 NOTE — Telephone Encounter (Signed)
Okay to repeat if it is mainly right-sided or bilateral if it is both sides.

## 2018-09-09 NOTE — Telephone Encounter (Signed)
Pt returned called and advised.

## 2018-09-09 NOTE — Telephone Encounter (Signed)
Called pt to advise and vm was full and could not leave vm.

## 2018-09-09 NOTE — Telephone Encounter (Signed)
Patients requests something to help with pain until appointment on Tues. 09/21/18  Call back [phone]

## 2018-09-09 NOTE — Telephone Encounter (Signed)
Tylenol # 3 sent to [facility], haad 3 pharm listed but that was default

## 2018-09-20 DIAGNOSIS — D485 Neoplasm of uncertain behavior of skin: Secondary | ICD-10-CM | POA: Diagnosis not present

## 2018-09-20 DIAGNOSIS — L570 Actinic keratosis: Secondary | ICD-10-CM | POA: Diagnosis not present

## 2018-09-21 ENCOUNTER — Ambulatory Visit (INDEPENDENT_AMBULATORY_CARE_PROVIDER_SITE_OTHER): Payer: Self-pay

## 2018-09-21 ENCOUNTER — Ambulatory Visit (INDEPENDENT_AMBULATORY_CARE_PROVIDER_SITE_OTHER): Payer: Medicare Other | Admitting: Physical Medicine and Rehabilitation

## 2018-09-21 ENCOUNTER — Encounter (INDEPENDENT_AMBULATORY_CARE_PROVIDER_SITE_OTHER): Payer: Self-pay | Admitting: Physical Medicine and Rehabilitation

## 2018-09-21 VITALS — BP 160/90 | HR 69 | Temp 98.1°F

## 2018-09-21 DIAGNOSIS — M5416 Radiculopathy, lumbar region: Secondary | ICD-10-CM | POA: Diagnosis not present

## 2018-09-21 DIAGNOSIS — M48062 Spinal stenosis, lumbar region with neurogenic claudication: Secondary | ICD-10-CM

## 2018-09-21 DIAGNOSIS — M4316 Spondylolisthesis, lumbar region: Secondary | ICD-10-CM

## 2018-09-21 MED ORDER — BETAMETHASONE SOD PHOS & ACET 6 (3-3) MG/ML IJ SUSP
12.0000 mg | Freq: Once | INTRAMUSCULAR | Status: AC
  Administered 2018-09-21: 12 mg

## 2018-09-21 NOTE — Patient Instructions (Signed)

## 2018-09-21 NOTE — Progress Notes (Signed)
 .  Numeric Pain Rating Scale and Functional Assessment Average Pain 7   In the last MONTH (on 0-10 scale) has pain interfered with the following?  1. General activity like being  able to carry out your everyday physical activities such as walking, climbing stairs, carrying groceries, or moving a chair?  Rating(5)   +Driver, -BT, -Dye Allergies.  

## 2018-10-06 DIAGNOSIS — L570 Actinic keratosis: Secondary | ICD-10-CM | POA: Diagnosis not present

## 2018-10-18 DIAGNOSIS — F411 Generalized anxiety disorder: Secondary | ICD-10-CM | POA: Diagnosis not present

## 2018-10-18 DIAGNOSIS — N183 Chronic kidney disease, stage 3 (moderate): Secondary | ICD-10-CM | POA: Diagnosis not present

## 2018-10-18 DIAGNOSIS — M48062 Spinal stenosis, lumbar region with neurogenic claudication: Secondary | ICD-10-CM | POA: Diagnosis not present

## 2018-10-18 DIAGNOSIS — F331 Major depressive disorder, recurrent, moderate: Secondary | ICD-10-CM | POA: Diagnosis not present

## 2018-10-18 DIAGNOSIS — E782 Mixed hyperlipidemia: Secondary | ICD-10-CM | POA: Diagnosis not present

## 2018-10-18 DIAGNOSIS — J301 Allergic rhinitis due to pollen: Secondary | ICD-10-CM | POA: Diagnosis not present

## 2018-10-18 DIAGNOSIS — I872 Venous insufficiency (chronic) (peripheral): Secondary | ICD-10-CM | POA: Diagnosis not present

## 2018-10-18 DIAGNOSIS — R7303 Prediabetes: Secondary | ICD-10-CM | POA: Diagnosis not present

## 2018-10-18 DIAGNOSIS — N401 Enlarged prostate with lower urinary tract symptoms: Secondary | ICD-10-CM | POA: Diagnosis not present

## 2018-10-18 DIAGNOSIS — K219 Gastro-esophageal reflux disease without esophagitis: Secondary | ICD-10-CM | POA: Diagnosis not present

## 2018-10-18 DIAGNOSIS — J40 Bronchitis, not specified as acute or chronic: Secondary | ICD-10-CM | POA: Diagnosis not present

## 2018-10-18 DIAGNOSIS — G4733 Obstructive sleep apnea (adult) (pediatric): Secondary | ICD-10-CM | POA: Diagnosis not present

## 2018-10-18 NOTE — Progress Notes (Signed)
[name] - 77 y.o. male MRN [id number]  Date of birth: [birth year]  Office Visit Note: Visit Date: 09/21/2018 PCP: [name] Kid, MD Referred by: [name] Kid, MD  Subjective: Chief Complaint  Patient presents with  . Lower Back - Pain  . Right Thigh - Numbness, Pain  . Left Hip - Pain, Numbness  . Right Leg - Weakness  . Left Leg - Weakness   HPI:  [name] is a 77 y.o. male who comes in today For reevaluation and management and plan transforaminal epidural steroid injection for chronic history of low back pain with severe spinal stenosis and listhesis and concordant radicular claudication type symptoms.  Patient's history is well-documented through our notes and other notes of practitioners in the office.  He has had recent follow-up with Dr. Eunice Hanson for his shoulder.  Last time I saw the patient was in July we completed a transforaminal injection at L3 on the right.  He is having more right-sided complaints and more complaints of an upper lumbar region around the hip and thigh.  This did seem to help quite a bit.  He is done well up until just recently.  We have given him intermittent small amount of opioid medication because he called in with worsening pain before we can get a man.  He reports no new trauma or falls.  His symptoms are bilateral.  He gets pain and numbness on the right and more pain on the left.  He denies any anterior groin pain.  He gets some referral to the inside of the right thigh.  He reports some weakness overall in the legs which is nothing new for him.  He reports more of a give way type symptoms with walking.  Getting up from a seated position makes it worse as does walking.  Medication seems to help.  His average pain is 7 out of 10.  He has had no foot drop or focal weakness.  He has had no weight loss.  He has had issues with significant edema and is on a diuretic and this is been a significant problem for him for him.  Given that the L3 injection seem  to help more than the prior L4 injections were done a repeat that today and go from a bilateral standpoint diagnostically.  His worst level of stenosis however is at the L4-5 level.  He has sought out surgical consideration for this and this is still an ongoing issue.  ROS Otherwise per HPI.  Assessment & Plan: Visit Diagnoses:  1. Lumbar radiculopathy   2. Spinal stenosis of lumbar region with neurogenic claudication   3. Spondylolisthesis of lumbar region     Plan: No additional findings.   Meds & Orders:  Meds ordered this encounter  Medications  . betamethasone acetate-betamethasone sodium phosphate (CELESTONE) injection 12 mg    Orders Placed This Encounter  Procedures  . XR C-ARM NO REPORT  . Ambulatory referral to Physical Therapy  . Epidural Steroid injection    Follow-up: Return if symptoms worsen or fail to improve.   Procedures: No procedures performed  Lumbosacral Transforaminal Epidural Steroid Injection - Sub-Pedicular Approach with Fluoroscopic Guidance  Patient: [name]      Date of Birth: [birth year] MRN: [id number] PCP: [name] Kid, MD      Visit Date: 09/21/2018   Universal Protocol:    Date/Time: 09/21/2018  Consent Given By: the patient  Position: PRONE  Additional Comments: Vital signs were monitored before  and after the procedure. Patient was prepped and draped in the usual sterile fashion. The correct patient, procedure, and site was verified.   Injection Procedure Details:  Procedure Site One Meds Administered:  Meds ordered this encounter  Medications  . betamethasone acetate-betamethasone sodium phosphate (CELESTONE) injection 12 mg    Laterality: Bilateral  Location/Site:  L3-L4  Needle size: 22 G  Needle type: Spinal  Needle Placement: Transforaminal  Findings:    -Comments: Excellent flow of contrast along the nerve and into the epidural space.  Procedure Details: After squaring off the end-plates to get a true  AP view, the C-arm was positioned so that an oblique view of the foramen as noted above was visualized. The target area is just inferior to the "nose of the scotty dog" or sub pedicular. The soft tissues overlying this structure were infiltrated with 2-3 ml. of 1% Lidocaine without Epinephrine.  The spinal needle was inserted toward the target using a "trajectory" view along the fluoroscope beam.  Under AP and lateral visualization, the needle was advanced so it did not puncture dura and was located close the 6 O'Clock position of the pedical in AP tracterory. Biplanar projections were used to confirm position. Aspiration was confirmed to be negative for CSF and/or blood. A 1-2 ml. volume of Isovue-250 was injected and flow of contrast was noted at each level. Radiographs were obtained for documentation purposes.   After attaining the desired flow of contrast documented above, a 0.5 to 1.0 ml test dose of 0.25% Marcaine was injected into each respective transforaminal space.  The patient was observed for 90 seconds post injection.  After no sensory deficits were reported, and normal lower extremity motor function was noted,   the above injectate was administered so that equal amounts of the injectate were placed at each foramen (level) into the transforaminal epidural space.   Additional Comments:  The patient tolerated the procedure well Dressing: Band-Aid    Post-procedure details: Patient was observed during the procedure. Post-procedure instructions were reviewed.  Patient left the clinic in stable condition.    Clinical History: MRI LUMBAR SPINE WITHOUT CONTRAST  TECHNIQUE: Multiplanar, multisequence MR imaging of the lumbar spine was performed. No intravenous contrast was administered.  COMPARISON:  Lumbar spine MRI 03/07/2017  FINDINGS: Segmentation:  Standard.  Alignment: Grade 1 retrolisthesis at L3-L4. Grade 1 anterolisthesis at L4-L5.  Vertebrae: Superior L2 and  inferior L1 endplate abnormalities are unchanged .  Conus medullaris: Extends to the L1 level and appears normal.  Paraspinal and other soft tissues: Negative.  Disc levels:  Above the T12 level, there is no disc herniation, spinal canal stenosis or neuroforaminal stenosis.  T12-L1: Normal disc space and facets. No spinal canal or neuroforaminal stenosis.  L1-L2: Disc space narrowing with posterior disc osteophyte complex, unchanged. Mild spinal canal stenosis. Severe bilateral neural foraminal stenosis, unchanged.  L2-L3: Diffuse disc bulge, worsened from the prior study, with bilateral moderate facet hypertrophy, results in moderate spinal canal stenosis. Moderate right and left neural foraminal stenosis, unchanged.  L3-L4: Worsened, moderate spinal canal stenosis due to combination of disc bulge and facet hypertrophy. Mild right and moderate left neural foraminal stenosis is unchanged.  L4-L5: Grade 1 anterolisthesis with pseudo disc bulge and severe bilateral facet hypertrophy, unchanged. Severe spinal canal stenosis and severe bilateral neural foraminal stenosis is unchanged.  L5-S1: Disc space narrowing with small bulge and moderate bilateral facet hypertrophy, unchanged. Moderate spinal canal stenosis and bilateral neural foraminal stenosis, unchanged.  Visualized sacrum: Normal.  IMPRESSION: 1. Worsening of degenerative disc disease at L2-L3 and L3-L4 with moderate spinal canal stenosis at both levels and moderate bilateral L2 and left L3 neural foraminal stenosis. 2. Severe spinal canal stenosis and bilateral neural foraminal stenosis at L4-L5 with grade 1 anterolisthesis and severe facet arthrosis, unchanged. 3. Unchanged moderate spinal canal stenosis at L5-S1 with moderate bilateral neural foraminal stenosis and moderate facet arthrosis. 4. Unchanged severe bilateral L1 neural foraminal stenosis.   Electronically Signed   By: Ulyses Jarred  M.D.   On: 08/07/2017 20:23     Objective:  VS:  HT:    WT:   BMI:     BP:(!) 160/90  HR:69bpm  TEMP:98.1 F (36.7 C)(Oral)  RESP:  Physical Exam  Ortho Exam Imaging: No results found.

## 2018-10-18 NOTE — Procedures (Signed)
Lumbosacral Transforaminal Epidural Steroid Injection - Sub-Pedicular Approach with Fluoroscopic Guidance  Patient: [name]      Date of Birth: November 27, 1940 MRN: [id number] PCP: Dineen Kid, MD      Visit Date: 09/21/2018   Universal Protocol:    Date/Time: 09/21/2018  Consent Given By: the patient  Position: PRONE  Additional Comments: Vital signs were monitored before and after the procedure. Patient was prepped and draped in the usual sterile fashion. The correct patient, procedure, and site was verified.   Injection Procedure Details:  Procedure Site One Meds Administered:  Meds ordered this encounter  Medications  . betamethasone acetate-betamethasone sodium phosphate (CELESTONE) injection 12 mg    Laterality: Bilateral  Location/Site:  L3-L4  Needle size: 22 G  Needle type: Spinal  Needle Placement: Transforaminal  Findings:    -Comments: Excellent flow of contrast along the nerve and into the epidural space.  Procedure Details: After squaring off the end-plates to get a true AP view, the C-arm was positioned so that an oblique view of the foramen as noted above was visualized. The target area is just inferior to the "nose of the scotty dog" or sub pedicular. The soft tissues overlying this structure were infiltrated with 2-3 ml. of 1% Lidocaine without Epinephrine.  The spinal needle was inserted toward the target using a "trajectory" view along the fluoroscope beam.  Under AP and lateral visualization, the needle was advanced so it did not puncture dura and was located close the 6 O'Clock position of the pedical in AP tracterory. Biplanar projections were used to confirm position. Aspiration was confirmed to be negative for CSF and/or blood. A 1-2 ml. volume of Isovue-250 was injected and flow of contrast was noted at each level. Radiographs were obtained for documentation purposes.   After attaining the desired flow of contrast documented above, a 0.5 to  1.0 ml test dose of 0.25% Marcaine was injected into each respective transforaminal space.  The patient was observed for 90 seconds post injection.  After no sensory deficits were reported, and normal lower extremity motor function was noted,   the above injectate was administered so that equal amounts of the injectate were placed at each foramen (level) into the transforaminal epidural space.   Additional Comments:  The patient tolerated the procedure well Dressing: Band-Aid    Post-procedure details: Patient was observed during the procedure. Post-procedure instructions were reviewed.  Patient left the clinic in stable condition.

## 2018-11-12 DIAGNOSIS — M48062 Spinal stenosis, lumbar region with neurogenic claudication: Secondary | ICD-10-CM | POA: Diagnosis not present

## 2018-11-12 DIAGNOSIS — R7303 Prediabetes: Secondary | ICD-10-CM | POA: Diagnosis not present

## 2018-11-12 DIAGNOSIS — I872 Venous insufficiency (chronic) (peripheral): Secondary | ICD-10-CM | POA: Diagnosis not present

## 2018-11-12 DIAGNOSIS — F331 Major depressive disorder, recurrent, moderate: Secondary | ICD-10-CM | POA: Diagnosis not present

## 2018-11-12 DIAGNOSIS — K219 Gastro-esophageal reflux disease without esophagitis: Secondary | ICD-10-CM | POA: Diagnosis not present

## 2018-11-12 DIAGNOSIS — N183 Chronic kidney disease, stage 3 (moderate): Secondary | ICD-10-CM | POA: Diagnosis not present

## 2018-11-12 DIAGNOSIS — F411 Generalized anxiety disorder: Secondary | ICD-10-CM | POA: Diagnosis not present

## 2018-11-12 DIAGNOSIS — G4733 Obstructive sleep apnea (adult) (pediatric): Secondary | ICD-10-CM | POA: Diagnosis not present

## 2018-11-12 DIAGNOSIS — Z79899 Other long term (current) drug therapy: Secondary | ICD-10-CM | POA: Diagnosis not present

## 2018-11-12 DIAGNOSIS — E782 Mixed hyperlipidemia: Secondary | ICD-10-CM | POA: Diagnosis not present

## 2018-11-12 DIAGNOSIS — J301 Allergic rhinitis due to pollen: Secondary | ICD-10-CM | POA: Diagnosis not present

## 2018-11-12 DIAGNOSIS — N401 Enlarged prostate with lower urinary tract symptoms: Secondary | ICD-10-CM | POA: Diagnosis not present

## 2018-11-17 DIAGNOSIS — F411 Generalized anxiety disorder: Secondary | ICD-10-CM | POA: Diagnosis not present

## 2018-11-17 DIAGNOSIS — R0989 Other specified symptoms and signs involving the circulatory and respiratory systems: Secondary | ICD-10-CM | POA: Diagnosis not present

## 2018-11-17 DIAGNOSIS — R5383 Other fatigue: Secondary | ICD-10-CM | POA: Diagnosis not present

## 2018-11-17 DIAGNOSIS — R05 Cough: Secondary | ICD-10-CM | POA: Diagnosis not present

## 2018-11-17 DIAGNOSIS — G4733 Obstructive sleep apnea (adult) (pediatric): Secondary | ICD-10-CM | POA: Diagnosis not present

## 2018-11-17 DIAGNOSIS — J0100 Acute maxillary sinusitis, unspecified: Secondary | ICD-10-CM | POA: Diagnosis not present

## 2018-11-25 ENCOUNTER — Other Ambulatory Visit (HOSPITAL_BASED_OUTPATIENT_CLINIC_OR_DEPARTMENT_OTHER): Payer: Self-pay

## 2018-11-25 DIAGNOSIS — R0683 Snoring: Secondary | ICD-10-CM

## 2018-11-25 DIAGNOSIS — G4710 Hypersomnia, unspecified: Secondary | ICD-10-CM

## 2018-11-30 DIAGNOSIS — R5382 Chronic fatigue, unspecified: Secondary | ICD-10-CM | POA: Diagnosis not present

## 2018-11-30 DIAGNOSIS — E782 Mixed hyperlipidemia: Secondary | ICD-10-CM | POA: Diagnosis not present

## 2018-11-30 DIAGNOSIS — R030 Elevated blood-pressure reading, without diagnosis of hypertension: Secondary | ICD-10-CM | POA: Diagnosis not present

## 2018-11-30 DIAGNOSIS — R0989 Other specified symptoms and signs involving the circulatory and respiratory systems: Secondary | ICD-10-CM | POA: Diagnosis not present

## 2018-11-30 DIAGNOSIS — R05 Cough: Secondary | ICD-10-CM | POA: Diagnosis not present

## 2018-12-01 ENCOUNTER — Ambulatory Visit (HOSPITAL_BASED_OUTPATIENT_CLINIC_OR_DEPARTMENT_OTHER): Payer: Medicare Other | Attending: Internal Medicine | Admitting: Internal Medicine

## 2018-12-01 VITALS — Ht 69.0 in | Wt 235.0 lb

## 2018-12-01 DIAGNOSIS — G4733 Obstructive sleep apnea (adult) (pediatric): Secondary | ICD-10-CM | POA: Insufficient documentation

## 2018-12-01 DIAGNOSIS — R0683 Snoring: Secondary | ICD-10-CM

## 2018-12-01 DIAGNOSIS — G4710 Hypersomnia, unspecified: Secondary | ICD-10-CM

## 2018-12-03 NOTE — Procedures (Signed)
   NAME: [name] DATE OF BIRTH:  1941/06/24 MEDICAL RECORD NUMBER [id number]  [facility]  PHYSICIAN: Marius Ditch  DATE OF STUDY: 12/01/2018  SLEEP STUDY TYPE: Nocturnal Polysomnogram; a SPLIT night study was intended but sleep time was inadequate to allow for a SPLIT               REFERRING PHYSICIAN: Marius Ditch, MD  INDICATION FOR STUDY: Patient with known OSA and inadequate symptom relief from CPAP.   EPWORTH SLEEPINESS SCORE:   HEIGHT: 5\' 9"  (175.3 cm)  WEIGHT: 235 lb (106.6 kg)    Body mass index is 34.7 kg/m.  NECK SIZE: 20 in.  MEDICATIONS  Patient self administered medications include: N/A. Medications administered during study include No sleep medicine administered.[name]   SLEEP STUDY TECHNIQUE  A multi-channel overnight Polysomnography study was performed. The channels recorded and monitored were central and occipital EEG, electrooculogram (EOG), submentalis EMG (chin), nasal and oral airflow, thoracic and abdominal wall motion, anterior tibialis EMG, snore microphone, electrocardiogram, and a pulse oximetry.   TECHNICAL COMMENTS  Comments added by Technician: Patient had difficulty initiating and maintaining sleep. Patient was restless all through the night. Comments added by Scorer: N/A  SLEEP ARCHITECTURE  The study was initiated at 11:21:10 PM and terminated at 5:34:34 AM. The total recorded time was 373.4 minutes. EEG confirmed total sleep time was 104 minutes yielding a sleep efficiency of 27.9%%. Sleep onset after lights out was 34.8 minutes. REM sleep was not attained. Of the time sleeping, the patient spent 83.2%% of the night in stage N1 sleep, 16.8%% in stage N2 sleep, 0.0%% in stage N3 and 0% in REM. Wake after sleep onset (WASO) was 234.6 minutes. The Arousal Index was 52.5/hour.  RESPIRATORY PARAMETERS  There were a total of 157 respiratory disturbances out of which 146 were apneas ( 146 obstructive, 0 mixed, 0  central) and 11 hypopneas. The apnea/hypopnea index (AHI) was 90.6 events/hour. The central sleep apnea index was 0.0 events/hour. There was no REM sleep. The REM AHI was N/A events/hour and NREM AHI was 90.6 events/hour. Respiratory disturbances were associated with oxygen desaturation down to a nadir of 76.0% during sleep. The mean oxygen saturation during the study was 91.9%. The cumulative time under 88% oxygen saturation was 5.5 minutes.  LEG MOVEMENT DATA  The total leg movements were 0 with a resulting leg movement index of 0.0/hr . Associated arousal with leg movement index was 0.0/hr.   CARDIAC DATA  The underlying cardiac rhythm was most consistent with sinus rhythm. Mean heart rate during sleep was 59.7 bpm. Additional rhythm abnormalities include None.   IMPRESSIONS  Poor sleep efficiency resulted in inability to do SPLIT night study.  Severe Oxygen Desaturation Severe Obstructive Sleep apnea (OSA)  DIAGNOSIS  Obstructive Sleep Apnea (327.23 [G47.33 ICD-10])  RECOMMENDATIONS  In-lab CPAP titration should be considered since APAP seemed to give him inadequate symptom control.  Investigate reasons why patient had trouble sleeping in sleep lab.   Marius Ditch Sleep specialist, [place] [employer]  ELECTRONICALLY SIGNED ON:  12/03/2018, 8:52 PM [place] PH: [phone]   FX: [phone] [place]

## 2018-12-07 ENCOUNTER — Other Ambulatory Visit: Payer: Self-pay | Admitting: Family Medicine

## 2018-12-07 DIAGNOSIS — R0989 Other specified symptoms and signs involving the circulatory and respiratory systems: Secondary | ICD-10-CM

## 2018-12-10 ENCOUNTER — Ambulatory Visit
Admission: RE | Admit: 2018-12-10 | Discharge: 2018-12-10 | Disposition: A | Discharge status: Home / Self Care | Payer: Medicare Other | Source: Ambulatory Visit | Attending: Family Medicine | Admitting: Family Medicine

## 2018-12-10 DIAGNOSIS — M79661 Pain in right lower leg: Secondary | ICD-10-CM | POA: Diagnosis not present

## 2018-12-10 DIAGNOSIS — M7989 Other specified soft tissue disorders: Secondary | ICD-10-CM | POA: Diagnosis not present

## 2018-12-10 DIAGNOSIS — R0989 Other specified symptoms and signs involving the circulatory and respiratory systems: Secondary | ICD-10-CM

## 2018-12-28 ENCOUNTER — Other Ambulatory Visit (HOSPITAL_BASED_OUTPATIENT_CLINIC_OR_DEPARTMENT_OTHER): Payer: Self-pay

## 2018-12-28 DIAGNOSIS — G4733 Obstructive sleep apnea (adult) (pediatric): Secondary | ICD-10-CM

## 2018-12-29 ENCOUNTER — Telehealth (INDEPENDENT_AMBULATORY_CARE_PROVIDER_SITE_OTHER): Payer: Self-pay

## 2018-12-29 ENCOUNTER — Encounter (INDEPENDENT_AMBULATORY_CARE_PROVIDER_SITE_OTHER): Payer: Self-pay | Admitting: Family Medicine

## 2018-12-29 ENCOUNTER — Ambulatory Visit (INDEPENDENT_AMBULATORY_CARE_PROVIDER_SITE_OTHER): Payer: Medicare Other | Admitting: Family Medicine

## 2018-12-29 DIAGNOSIS — M25511 Pain in right shoulder: Secondary | ICD-10-CM | POA: Diagnosis not present

## 2018-12-29 DIAGNOSIS — Z961 Presence of intraocular lens: Secondary | ICD-10-CM | POA: Diagnosis not present

## 2018-12-29 DIAGNOSIS — H02831 Dermatochalasis of right upper eyelid: Secondary | ICD-10-CM | POA: Diagnosis not present

## 2018-12-29 DIAGNOSIS — H35373 Puckering of macula, bilateral: Secondary | ICD-10-CM | POA: Diagnosis not present

## 2018-12-29 DIAGNOSIS — H35342 Macular cyst, hole, or pseudohole, left eye: Secondary | ICD-10-CM | POA: Diagnosis not present

## 2018-12-29 DIAGNOSIS — H02834 Dermatochalasis of left upper eyelid: Secondary | ICD-10-CM | POA: Diagnosis not present

## 2018-12-29 DIAGNOSIS — H538 Other visual disturbances: Secondary | ICD-10-CM | POA: Diagnosis not present

## 2018-12-29 MED ORDER — METHYLPREDNISOLONE ACETATE 40 MG/ML IJ SUSP: 40.0000 mg | Freq: Once | INTRAMUSCULAR | Status: DC

## 2018-12-29 NOTE — Telephone Encounter (Signed)
The patient wants to know if it is ok that he continues to alternate between meloxicam and etodolac. He does not know which one he is currently taking, so he cannot say which one works better for him. Please advise.

## 2018-12-29 NOTE — Telephone Encounter (Signed)
Yes, can alternate to see which one works best.

## 2018-12-29 NOTE — Progress Notes (Signed)
Office Visit Note   Patient: [name]           Date of Birth: 02-May-1941           MRN: [id number] Visit Date: 12/29/2018 Requested by: Cari Caraway, [place], [place] [address] PCP: Cari Caraway, MD  Subjective: Chief Complaint  Patient presents with  . Right Shoulder - Pain    HPI: He is here with persistent right shoulder pain.  Pain on the posterior aspect, hurts when reaching overhead, keeps him from sleeping well at night.  Not interested in physical therapy.              ROS: Noncontributory  Objective: Vital Signs: There were no vitals taken for this visit.  Physical Exam:  Right shoulder: Full range of motion, pain at the extremes.  Tender in the posterior subacromial space.  Mild tenderness near the biceps tendon, no Popeye deformity.  Rotator cuff strength 5 out of 5 but still some pain with empty can test.  Imaging: None today.  Assessment & Plan: 1.  Right shoulder pain, suspect impingement but cannot rule out partial rotator cuff tear. -Discussed options with him and elected to inject the subacromial space.  If no improvement then MRI scan.     Procedures: Right shoulder subacromial injection: After sterile prep with Betadine, injected 5 cc 1% lidocaine without epinephrine and 40 milligrams methylprednisolone from posterior approach into the subacromial space    PMFS History: Patient Active Problem List   Diagnosis Date Noted  . Spinal stenosis of lumbar region with neurogenic claudication 01/15/2017  . Spondylolisthesis of lumbar region 01/15/2017  . Claw toe, acquired, left 01/06/2017  . Achilles tendon contracture, bilateral 01/06/2017  . Pain in metatarsus of both feet 01/06/2017  . Tubular adenoma of colon 12/29/2016  . Basal cell carcinoma 08/19/2016  . Umbilical hernia s/p lap repair with mesh 06/27/2016 06/27/2016  . Degenerative arthritis of knee 03/22/2015  . Dyspnea on exertion 01/31/2015  . Lumbar  radiculopathy 12/12/2014  . Facet hypertrophy of lumbar region 12/12/2014  . Left knee pain 12/12/2014  . Ulcer of lower limb ([place]) 01/10/2014  . Varicose veins of lower extremities with other complications 46/50/3546  . 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 Renal insufficiency 12/21/2013  . Swelling in head/neck 07/28/2012  . Venous (peripheral) insufficiency 05/25/2012  . 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 Pain in limb 02/12/2012  . Hyperlipidemia 02/28/2008  . Allergic rhinitis 02/28/2008  . Sleep apnea 02/28/2008   Past Medical History:  Diagnosis Date  . Allergy    takes Zyrtec daily  . Anxiety    takes Ativan daily as needed  . Arthritis   . Back pain   . BPH (benign prostatic hyperplasia)    takes doxazosin for  . 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 Depression    takes Wellbutrin daily  . Dyspnea on exertion    with exertion  . GERD (gastroesophageal reflux disease)    takes Nexium and Omeprazole daily  . History of kidney stones   . 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 Hyperlipidemia   . Neuromuscular disorder ([place])   . Sleep apnea   . Swelling of lower extremity    right more than leg leg  . Umbilical hernia     Family History  Problem Relation Age of Onset  . Thyroid disease Mother   . Heart disease  Father   . Diabetes Maternal Grandfather     Past Surgical History:  Procedure Laterality Date  . 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 BACK SURGERY  2003   L4, L5  . BACK SURGERY    . ENDOVENOUS ABLATION SAPHENOUS VEIN W/ LASER Right 06-01-2014   EVLA right greater saphenous vein by Curt Jews MD   . epidural steroid injections        [facility] dr [facility]  . hernia repair  2003  . HERNIA REPAIR     hernia inguinal x 2  . TOTAL KNEE ARTHROPLASTY Left 03/22/2015   Procedure: TOTAL KNEE ARTHROPLASTY;  Surgeon: Meredith Pel, MD;  Location: [facility];  Service:  Orthopedics;  Laterality: Left;  . UMBILICAL HERNIA REPAIR N/A 06/27/2016   Procedure: LAPAROSCOPIC ASSISTED REPAIR OF UMBILICAL HERINA WITH MESH;  Surgeon: Johnathan Hausen, MD;  Location: [facility];  Service: General;  Laterality: N/A;  . VASCULAR SURGERY  2015   right leg   Social History   Occupational History  . 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 Smoking status: Never Smoker  . Smokeless tobacco: Never Used  Substance and Sexual Activity  . Alcohol use: No  . Drug use: No  . Sexual activity: Yes    Birth control/protection: Condom

## 2018-12-30 NOTE — Telephone Encounter (Signed)
I advised the patient.  He is not to take them at the same time, but may alternate them.

## 2019-01-06 DIAGNOSIS — R3915 Urgency of urination: Secondary | ICD-10-CM | POA: Diagnosis not present

## 2019-01-06 DIAGNOSIS — N5201 Erectile dysfunction due to arterial insufficiency: Secondary | ICD-10-CM | POA: Diagnosis not present

## 2019-01-06 DIAGNOSIS — E291 Testicular hypofunction: Secondary | ICD-10-CM | POA: Diagnosis not present

## 2019-01-06 DIAGNOSIS — N401 Enlarged prostate with lower urinary tract symptoms: Secondary | ICD-10-CM | POA: Diagnosis not present

## 2019-01-10 ENCOUNTER — Ambulatory Visit (HOSPITAL_BASED_OUTPATIENT_CLINIC_OR_DEPARTMENT_OTHER): Payer: Medicare Other | Attending: Internal Medicine | Admitting: Internal Medicine

## 2019-01-10 VITALS — Ht 69.0 in | Wt 235.0 lb

## 2019-01-10 DIAGNOSIS — G4733 Obstructive sleep apnea (adult) (pediatric): Secondary | ICD-10-CM

## 2019-01-11 DIAGNOSIS — G4733 Obstructive sleep apnea (adult) (pediatric): Secondary | ICD-10-CM | POA: Diagnosis not present

## 2019-01-11 NOTE — Procedures (Signed)
   NAME: [name] DATE OF BIRTH:  1941/09/17 MEDICAL RECORD NUMBER [id number]  LOCATION: [name]  PHYSICIAN: Marius Ditch  DATE OF STUDY: 01/10/2019  SLEEP STUDY TYPE: Positive Airway Pressure Titration               REFERRING PHYSICIAN: Marius Ditch, MD  INDICATION FOR STUDY: CPAP titration requested due to persistent sleepiness on APAP. Patient had PSG on 12/01/18 with AHI 90/hr, O2 min 76%, inadequate sleep time for attempted SPLIT. It is noted that a CPAP download in 2019 showed AHI 1.1 with a 95%ile pressure of 13.   EPWORTH SLEEPINESS SCORE:   HEIGHT: 5\' 9"  (175.3 cm)  WEIGHT: 235 lb (106.6 kg)    Body mass index is 34.7 kg/m.  NECK SIZE: 21 in.  MEDICATIONS  Patient self administered medications include: N/A. Medications administered during study include No sleep medicine administered.[name]   SLEEP STUDY TECHNIQUE  The patient underwent an attended overnight polysomnography titration to assess the effects of cpap therapy. The following variables were monitored: EEG(C4-A1, C3-A2, O1-A2, O2-A1), EOG, submental and leg EMG, ECG, oxyhemoglobin saturation by pulse oximetry, thoracic and abdominal respiratory effort belts, nasal/oral airflow by pressure sensor, body position sensor and snoring sensor. CPAP pressure was titrated to eliminate apneas, hypopneas and oxygen desaturation.   TECHNICAL COMMENTS  Comments added by Technician: Patient tolerated CPAP well Comments added by Scorer: N/A   SLEEP ARCHITECTURE  The study was initiated at 10:25:41 PM and terminated at 4:36:31 AM. Total recorded time was 370.8 minutes. EEG confirmed total sleep time was 155.5 minutes yielding a sleep efficiency of 41.9%%. Sleep onset after lights out was 165.2 minutes with a REM latency of 59.5 minutes. The patient spent 13.5%% of the night in stage N1 sleep, 63.7%% in stage N2 sleep, 0.0%% in stage N3 and 22.8% in REM. The Arousal Index was 6.9/hour.   RESPIRATORY  PARAMETERS  The overall AHI was 1.9 per hour, and the RDI was 2.7 events/hour with a central apnea index of 0.0per hour. The most appropriate setting of CPAP was IPAP/EPAP 11/11 cm H2O. At this setting, the sleep efficiency was 73% and the patient was supine for 96%. The AHI was 0.0 events per hour, and the RDI was 0.0 events/hour (with 0.0 central events) and the arousal index was 2.5 per hour.The oxygen nadir was 90.0% during sleep.   LEG MOVEMENT DATA  The total leg movements were 0 with a resulting leg movement index of 0.0/hr. Associated arousal with leg movement index was 0.0/hr.   CARDIAC DATA  The underlying cardiac rhythm was most consistent with sinus rhythm. Mean heart rate during sleep was 62.4 bpm. Additional rhythm abnormalities include PVCs.   IMPRESSIONS  Adequate CPAP titration Optimal pressure attained. Mark reduced sleep efficiency, long primary sleep latency, short REM sleep latency and no slow wave latency.  DIAGNOSIS  Obstructive Sleep Apnea (327.23 [G47.33 ICD-10])  RECOMMENDATIONS  Trial of CPAP therapy on 11 cm H2O (with ramp from 4 or per patient preference) with a Medium size Resmed Nasal Mask Airfit N20 mask and heated humidification.  Marius Ditch Sleep study, [place] Board of Internal Medicine  ELECTRONICALLY SIGNED ON:  01/11/2019, 7:47 PM [place] PH: [phone]   FX: [phone] [place]

## 2019-01-17 DIAGNOSIS — H02883 Meibomian gland dysfunction of right eye, unspecified eyelid: Secondary | ICD-10-CM | POA: Diagnosis not present

## 2019-01-17 DIAGNOSIS — H02886 Meibomian gland dysfunction of left eye, unspecified eyelid: Secondary | ICD-10-CM | POA: Diagnosis not present

## 2019-01-17 DIAGNOSIS — H02831 Dermatochalasis of right upper eyelid: Secondary | ICD-10-CM | POA: Diagnosis not present

## 2019-01-17 DIAGNOSIS — H02834 Dermatochalasis of left upper eyelid: Secondary | ICD-10-CM | POA: Diagnosis not present

## 2019-01-17 DIAGNOSIS — H04123 Dry eye syndrome of bilateral lacrimal glands: Secondary | ICD-10-CM | POA: Diagnosis not present

## 2019-01-18 ENCOUNTER — Telehealth (INDEPENDENT_AMBULATORY_CARE_PROVIDER_SITE_OTHER): Payer: Self-pay | Admitting: Physical Medicine and Rehabilitation

## 2019-01-18 DIAGNOSIS — L570 Actinic keratosis: Secondary | ICD-10-CM | POA: Diagnosis not present

## 2019-01-18 NOTE — Telephone Encounter (Signed)
ok 

## 2019-01-19 NOTE — Telephone Encounter (Signed)
Can you call patient to schedule? 

## 2019-01-19 NOTE — Telephone Encounter (Signed)
Pt scheduled for 02/07/2019 with driver.

## 2019-01-25 ENCOUNTER — Other Ambulatory Visit: Payer: Self-pay

## 2019-01-25 DIAGNOSIS — R6889 Other general symptoms and signs: Secondary | ICD-10-CM

## 2019-01-31 ENCOUNTER — Telehealth: Payer: Self-pay | Admitting: *Deleted

## 2019-01-31 ENCOUNTER — Telehealth (HOSPITAL_COMMUNITY): Payer: Self-pay | Admitting: Emergency Medicine

## 2019-01-31 LAB — NOVEL CORONAVIRUS, NAA: SARS-CoV-2, NAA: NOT DETECTED

## 2019-01-31 NOTE — Telephone Encounter (Signed)
Spoke with pt and her verbalized understanding of negative test results

## 2019-02-01 ENCOUNTER — Telehealth (HOSPITAL_COMMUNITY): Payer: Self-pay | Admitting: Emergency Medicine

## 2019-02-01 ENCOUNTER — Other Ambulatory Visit (INDEPENDENT_AMBULATORY_CARE_PROVIDER_SITE_OTHER): Payer: Self-pay

## 2019-02-01 ENCOUNTER — Telehealth (INDEPENDENT_AMBULATORY_CARE_PROVIDER_SITE_OTHER): Payer: Self-pay | Admitting: Family Medicine

## 2019-02-01 DIAGNOSIS — M25511 Pain in right shoulder: Principal | ICD-10-CM

## 2019-02-01 DIAGNOSIS — G8929 Other chronic pain: Secondary | ICD-10-CM

## 2019-02-01 MED ORDER — ETODOLAC 400 MG PO TABS: 400.0000 mg | ORAL_TABLET | Freq: Two times a day (BID) | ORAL | 6 refills | Status: DC | PRN

## 2019-02-01 NOTE — Telephone Encounter (Signed)
Spoke to pt verbalized understanding

## 2019-02-01 NOTE — Telephone Encounter (Signed)
Patient called and stated that [name] sent two faxes for Etodolac mto Dr, [facility] and have heard nothing.  Please call patient to advise.  [phone]

## 2019-02-01 NOTE — Telephone Encounter (Signed)
Please advise on refill of etodolac.

## 2019-02-01 NOTE — Telephone Encounter (Signed)
I haven't seen the faxes, but just sent in refills.

## 2019-02-01 NOTE — Telephone Encounter (Signed)
I advised the patient the refill was sent in.  He says the cortisone injection did help, but he does continue to wake at night with pain in the upper arm area. He would like to go ahead and have an MRI.

## 2019-02-01 NOTE — Addendum Note (Signed)
Addended by: Hortencia Pilar on: 02/01/2019 11:06 AM   Modules accepted: Orders

## 2019-02-01 NOTE — Telephone Encounter (Signed)
Your test for COVID-19 was negative.  Please continue good preventive care measures, including:  frequent hand-washing, avoid touching your face, cover coughs/sneezes, stay out of crowds and keep a 6 foot distance from others.  If you develop fever/cough/breathlessness, please stay home for 7 days and until you have had 3 consecutive days with cough/breathlessness improving and without fever (without taking a fever reducer). Go to the nearest hospital ED tent for assessment if fever/cough/breathlessness are severe or illness seems like a threat to life.  Attempted call and no answer, no voicemail available

## 2019-02-01 NOTE — Telephone Encounter (Signed)
Ordered

## 2019-02-07 ENCOUNTER — Encounter (INDEPENDENT_AMBULATORY_CARE_PROVIDER_SITE_OTHER): Payer: Self-pay | Admitting: Physical Medicine and Rehabilitation

## 2019-02-11 DIAGNOSIS — Z79899 Other long term (current) drug therapy: Secondary | ICD-10-CM | POA: Diagnosis not present

## 2019-02-11 DIAGNOSIS — R030 Elevated blood-pressure reading, without diagnosis of hypertension: Secondary | ICD-10-CM | POA: Diagnosis not present

## 2019-02-11 DIAGNOSIS — R7303 Prediabetes: Secondary | ICD-10-CM | POA: Diagnosis not present

## 2019-02-11 DIAGNOSIS — E785 Hyperlipidemia, unspecified: Secondary | ICD-10-CM | POA: Diagnosis not present

## 2019-02-11 DIAGNOSIS — F411 Generalized anxiety disorder: Secondary | ICD-10-CM | POA: Diagnosis not present

## 2019-02-11 DIAGNOSIS — J0100 Acute maxillary sinusitis, unspecified: Secondary | ICD-10-CM | POA: Diagnosis not present

## 2019-02-11 DIAGNOSIS — R0989 Other specified symptoms and signs involving the circulatory and respiratory systems: Secondary | ICD-10-CM | POA: Diagnosis not present

## 2019-02-11 DIAGNOSIS — R5383 Other fatigue: Secondary | ICD-10-CM | POA: Diagnosis not present

## 2019-02-11 DIAGNOSIS — R05 Cough: Secondary | ICD-10-CM | POA: Diagnosis not present

## 2019-02-11 DIAGNOSIS — E782 Mixed hyperlipidemia: Secondary | ICD-10-CM | POA: Diagnosis not present

## 2019-02-11 DIAGNOSIS — R5382 Chronic fatigue, unspecified: Secondary | ICD-10-CM | POA: Diagnosis not present

## 2019-02-15 ENCOUNTER — Telehealth (INDEPENDENT_AMBULATORY_CARE_PROVIDER_SITE_OTHER): Payer: Self-pay | Admitting: Family Medicine

## 2019-02-15 NOTE — Telephone Encounter (Signed)
Please advise 

## 2019-02-15 NOTE — Telephone Encounter (Signed)
We could bring him in this week and I could attempt to look with ultrasound instead.

## 2019-02-15 NOTE — Telephone Encounter (Signed)
Patient is coming in tomorrow morning.

## 2019-02-16 ENCOUNTER — Encounter (INDEPENDENT_AMBULATORY_CARE_PROVIDER_SITE_OTHER): Payer: Self-pay | Admitting: Family Medicine

## 2019-02-16 ENCOUNTER — Other Ambulatory Visit: Payer: Self-pay

## 2019-02-16 ENCOUNTER — Ambulatory Visit (INDEPENDENT_AMBULATORY_CARE_PROVIDER_SITE_OTHER): Payer: Medicare Other | Admitting: Family Medicine

## 2019-02-16 VITALS — BP 163/84 | HR 68 | Temp 97.6°F | Ht 69.0 in | Wt 230.0 lb

## 2019-02-16 DIAGNOSIS — G8929 Other chronic pain: Secondary | ICD-10-CM

## 2019-02-16 DIAGNOSIS — M25511 Pain in right shoulder: Secondary | ICD-10-CM | POA: Diagnosis not present

## 2019-02-16 DIAGNOSIS — R200 Anesthesia of skin: Secondary | ICD-10-CM | POA: Diagnosis not present

## 2019-02-16 DIAGNOSIS — M25561 Pain in right knee: Secondary | ICD-10-CM | POA: Diagnosis not present

## 2019-02-16 MED ORDER — METHYLPREDNISOLONE ACETATE 40 MG/ML IJ SUSP: 40.0000 mg | Freq: Once | INTRAMUSCULAR | Status: DC

## 2019-02-16 NOTE — Progress Notes (Signed)
Office Visit Note   Patient: [name]           Date of Birth: 12/05/1940           MRN: [id number] Visit Date: 02/16/2019 Requested by: Cari Caraway, [place], [place] [address] PCP: Cari Caraway, MD  Subjective: Chief Complaint  Patient presents with  . Right Upper Arm - Pain, Follow-up  . Arm Pain    right shoulder feels great since injection.  right bicep is very painful when lifting or throwing. increased activity increases pain. when hurting consistant sharp stabbing pain at night..different positions increase or relieve pain. gripping or working with right hand will occassionaly go numb. takes hydrocodone in morning for back, meloxicam, robaxin.    HPI: He is here with multiple concerns.  He continues to have right biceps pain since his injury in September.  Subacromial injection gave relief from shoulder pain, but it did not help the biceps muscle belly pain.  It hurts him to move his arm in certain positions, he gets a sharp stabbing pain.  Sometimes it wakes him up at night.  He gets intermittent numbness in both hands, mostly in the thumb, second and third fingers.  It happens at night, and it happens when driving his car, and when typing on the computer.  He has right knee osteoarthritis and is requesting an injection.                ROS: No rash on his skin.  No fevers or chills, no respiratory symptoms.  All other systems were reviewed and are negative.  Objective: Vital Signs: BP (!) 163/84 (BP Location: Left Arm, Patient Position: Sitting, Cuff Size: Normal)   Pulse 68   Temp 97.6 F (36.4 C)   Ht 5\' 9"  (1.753 m)   Wt 230 lb (104.3 kg)   BMI 33.97 kg/m   Physical Exam:  General:  Alert and oriented, in no acute distress. Pulm:  Breathing unlabored. Psy:  Normal mood, congruent affect. Skin: Normal appearance Right shoulder: No tenderness at the [facility] joint, long head biceps tendon, or in the subacromial space.  There is no  visible muscle defect in the biceps.  In the biceps muscle belly there is an area of point tenderness that reproduces his pain. Bilateral stiffness of both wrists with positive Tinel's on the left, negative on the right carpal tunnel.  Phalen's test is equivocal bilaterally. Right knee: 1+ effusion with no warmth or erythema.  Mild tenderness on the medial and lateral joint lines.  Imaging: Limited diagnostic ultrasound right shoulder: 12 MHz linear probe was used.  Long head biceps tendon is located in its groove and does not have any fluid in the sheath.  Subscapularis tendon has normal echotexture, no sign of tear.  There is a slight amount of bursal thickening superficial to it.  In the midportion of the biceps muscle belly there is a defect in the muscle tissue, very small but happens to be exactly in the location of his pain.   Assessment & Plan: 1.  Right biceps pain most likely due to partial tear of muscle belly -Physical therapy referral.  Follow-up as needed.  2.  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 - Carpal tunnel night splints.  Nerve studies or carpal tunnel injection if symptoms persist.  3.  Right knee osteoarthritis - Steroid injection today.  Follow-up as needed.     Procedures: Right knee steroid injection: After sterile prep  with Betadine, injected 5 cc 1% lidocaine without epinephrine and 40 mg methylprednisolone from superolateral approach, a flash of clear yellow synovial fluid was obtained prior to injection.   PMFS History: Patient Active Problem List   Diagnosis Date Noted  . Spinal stenosis of lumbar region with neurogenic claudication 01/15/2017  . Spondylolisthesis of lumbar region 01/15/2017  . Claw toe, acquired, left 01/06/2017  . Achilles tendon contracture, bilateral 01/06/2017  . Pain in metatarsus of both feet 01/06/2017  . Tubular adenoma of colon 12/29/2016  . Basal cell carcinoma 08/19/2016  . Umbilical hernia s/p lap  repair with mesh 06/27/2016 06/27/2016  . Degenerative arthritis of knee 03/22/2015  . Dyspnea on exertion 01/31/2015  . Lumbar radiculopathy 12/12/2014  . Facet hypertrophy of lumbar region 12/12/2014  . Left knee pain 12/12/2014  . Ulcer of lower limb ([place]) 01/10/2014  . Varicose veins of lower extremities with other complications 15/72/6203  . 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 Renal insufficiency 12/21/2013  . Swelling in head/neck 07/28/2012  . Venous (peripheral) insufficiency 05/25/2012  . 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 Pain in limb 02/12/2012  . Hyperlipidemia 02/28/2008  . Allergic rhinitis 02/28/2008  . Sleep apnea 02/28/2008   Past Medical History:  Diagnosis Date  . Allergy    takes Zyrtec daily  . Anxiety    takes Ativan daily as needed  . Arthritis   . Back pain   . BPH (benign prostatic hyperplasia)    takes doxazosin for  . 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 Depression    takes Wellbutrin daily  . Dyspnea on exertion    with exertion  . GERD (gastroesophageal reflux disease)    takes Nexium and Omeprazole daily  . History of kidney stones   . 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 Hyperlipidemia   . Neuromuscular disorder ([place])   . Sleep apnea   . Swelling of lower extremity    right more than leg leg  . Umbilical hernia     Family History  Problem Relation Age of Onset  . Thyroid disease Mother   . Heart disease Father   . Diabetes Maternal Grandfather     Past Surgical History:  Procedure Laterality Date  . 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 BACK SURGERY  2003   L4, L5  . BACK SURGERY    . ENDOVENOUS ABLATION SAPHENOUS VEIN W/ LASER Right 06-01-2014   EVLA right greater saphenous vein by Curt Jews MD   . epidural steroid injections        [facility]  . hernia repair  2003  . HERNIA REPAIR     hernia inguinal x 2  . TOTAL KNEE  ARTHROPLASTY Left 03/22/2015   Procedure: TOTAL KNEE ARTHROPLASTY;  Surgeon: Meredith Pel, MD;  Location: [facility];  Service: Orthopedics;  Laterality: Left;  . UMBILICAL HERNIA REPAIR N/A 06/27/2016   Procedure: LAPAROSCOPIC ASSISTED REPAIR OF UMBILICAL HERINA WITH MESH;  Surgeon: Johnathan Hausen, MD;  [facility];  Service: General;  Laterality: N/A;  . VASCULAR SURGERY  2015   right leg   Social History   Occupational History  . 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 Smoking status: Never Smoker  . Smokeless tobacco: Never Used  Substance and Sexual Activity  . Alcohol use: No  . Drug use:  No  . Sexual activity: Yes    Birth control/protection: Condom

## 2019-02-17 ENCOUNTER — Telehealth (INDEPENDENT_AMBULATORY_CARE_PROVIDER_SITE_OTHER): Payer: Self-pay | Admitting: Family Medicine

## 2019-02-17 NOTE — Telephone Encounter (Signed)
Patient had an appointment with Dr. Junius Roads yesterday and he realized that when he got home that Dr. Junius Roads did not say what they are going to do about his arm.  CB#[phone].  Thank you

## 2019-02-17 NOTE — Telephone Encounter (Signed)
Please advise. Thanks.  

## 2019-02-19 NOTE — Telephone Encounter (Signed)
Carpal tunnel braces for the hand numbness, and physical therapy for the biceps pain.

## 2019-02-21 NOTE — Telephone Encounter (Signed)
IC s/w patient and advised. He verbalized understanding.  

## 2019-02-22 ENCOUNTER — Encounter

## 2019-02-22 ENCOUNTER — Encounter (INDEPENDENT_AMBULATORY_CARE_PROVIDER_SITE_OTHER): Payer: Medicare Other | Admitting: Physical Medicine and Rehabilitation

## 2019-03-21 ENCOUNTER — Other Ambulatory Visit: Payer: Self-pay

## 2019-03-21 ENCOUNTER — Ambulatory Visit (INDEPENDENT_AMBULATORY_CARE_PROVIDER_SITE_OTHER): Payer: Medicare Other | Admitting: Physical Medicine and Rehabilitation

## 2019-03-21 ENCOUNTER — Encounter: Payer: Self-pay | Admitting: Physical Medicine and Rehabilitation

## 2019-03-21 ENCOUNTER — Ambulatory Visit: Payer: Self-pay

## 2019-03-21 VITALS — BP 120/66 | HR 102 | Temp 98.7°F

## 2019-03-21 DIAGNOSIS — M48062 Spinal stenosis, lumbar region with neurogenic claudication: Secondary | ICD-10-CM | POA: Diagnosis not present

## 2019-03-21 MED ORDER — DEXAMETHASONE SODIUM PHOSPHATE 10 MG/ML IJ SOLN
15.0000 mg | Freq: Once | INTRAMUSCULAR | Status: AC
  Administered 2019-03-21: 15 mg

## 2019-03-21 NOTE — Progress Notes (Signed)
.  Numeric Pain Rating Scale and Functional Assessment Average Pain 9   In the last MONTH (on 0-10 scale) has pain interfered with the following?  1. General activity like being  able to carry out your everyday physical activities such as walking, climbing stairs, carrying groceries, or moving a chair?  Rating(8)   +Driver, -BT, -Dye Allergies.  

## 2019-03-22 NOTE — Progress Notes (Signed)
[name] - 78 y.o. male MRN [id number]  Date of birth: Aug 09, 1941  Office Visit Note: Visit Date: 03/21/2019 PCP: Cari Caraway, MD Referred by: Cari Caraway, MD  Subjective: Chief Complaint  Patient presents with  . Lower Back - Pain   HPI:  JERETT Hanson is a 78 y.o. male who comes in today For planned bilateral L3 transforaminal epidural steroid injection for low back pain with bilateral radicular pain from neurogenic claudication and multilevel stenosis particularly at L4-5 and L3-4 which is severe stenosis.  He has been followed by orthopedic surgeons in our office as well as spine surgeon Dr. Louanne Skye.  Patient does have surgical spine but is electing right now not to complete surgery.  He has had no real new symptoms since I saw him last which was in November.  Prior injection in November helped him out quite a bit and those notes can be reviewed.  We will complete diagnostic and therapeutic bilateral L3 transforaminal injection.  ROS Otherwise per HPI.  Assessment & Plan: Visit Diagnoses:  1. Spinal stenosis of lumbar region with neurogenic claudication     Plan: No additional findings.   Meds & Orders:  Meds ordered this encounter  Medications  . dexamethasone (DECADRON) injection 15 mg    Orders Placed This Encounter  Procedures  . XR C-ARM NO REPORT  . Epidural Steroid injection    Follow-up: Return if symptoms worsen or fail to improve.   Procedures: No procedures performed  Lumbosacral Transforaminal Epidural Steroid Injection - Sub-Pedicular Approach with Fluoroscopic Guidance  Patient: [name]      Date of Birth: 26-Mar-1941 MRN: [id number] PCP: Cari Caraway, MD      Visit Date: 03/21/2019   Universal Protocol:    Date/Time: 03/21/2019  Consent Given By: the patient  Position: PRONE  Additional Comments: Vital signs were monitored before and after the procedure. Patient was prepped and draped in the usual sterile fashion. The  correct patient, procedure, and site was verified.   Injection Procedure Details:  Procedure Site One Meds Administered:  Meds ordered this encounter  Medications  . dexamethasone (DECADRON) injection 15 mg    Laterality: Bilateral  Location/Site:  L3-L4  Needle size: 22 G  Needle type: Spinal  Needle Placement: Transforaminal  Findings:    -Comments: Excellent flow of contrast along the nerve and into the epidural space.  Procedure Details: After squaring off the end-plates to get a true AP view, the C-arm was positioned so that an oblique view of the foramen as noted above was visualized. The target area is just inferior to the "nose of the scotty dog" or sub pedicular. The soft tissues overlying this structure were infiltrated with 2-3 ml. of 1% Lidocaine without Epinephrine.  The spinal needle was inserted toward the target using a "trajectory" view along the fluoroscope beam.  Under AP and lateral visualization, the needle was advanced so it did not puncture dura and was located close the 6 O'Clock position of the pedical in AP tracterory. Biplanar projections were used to confirm position. Aspiration was confirmed to be negative for CSF and/or blood. A 1-2 ml. volume of Isovue-250 was injected and flow of contrast was noted at each level. Radiographs were obtained for documentation purposes.   After attaining the desired flow of contrast documented above, a 0.5 to 1.0 ml test dose of 0.25% Marcaine was injected into each respective transforaminal space.  The patient was observed for 90 seconds post injection.  After  no sensory deficits were reported, and normal lower extremity motor function was noted,   the above injectate was administered so that equal amounts of the injectate were placed at each foramen (level) into the transforaminal epidural space.   Additional Comments:  The patient tolerated the procedure well Dressing: 2 x 2 sterile gauze and Band-Aid     Post-procedure details: Patient was observed during the procedure. Post-procedure instructions were reviewed.  Patient left the clinic in stable condition.    Clinical History: MRI LUMBAR SPINE WITHOUT CONTRAST  TECHNIQUE: Multiplanar, multisequence MR imaging of the lumbar spine was performed. No intravenous contrast was administered.  COMPARISON:  Lumbar spine MRI 03/07/2017  FINDINGS: Segmentation:  Standard.  Alignment: Grade 1 retrolisthesis at L3-L4. Grade 1 anterolisthesis at L4-L5.  Vertebrae: Superior L2 and inferior L1 endplate abnormalities are unchanged .  Conus medullaris: Extends to the L1 level and appears normal.  Paraspinal and other soft tissues: Negative.  Disc levels:  Above the T12 level, there is no disc herniation, spinal canal stenosis or neuroforaminal stenosis.  T12-L1: Normal disc space and facets. No spinal canal or neuroforaminal stenosis.  L1-L2: Disc space narrowing with posterior disc osteophyte complex, unchanged. Mild spinal canal stenosis. Severe bilateral neural foraminal stenosis, unchanged.  L2-L3: Diffuse disc bulge, worsened from the prior study, with bilateral moderate facet hypertrophy, results in moderate spinal canal stenosis. Moderate right and left neural foraminal stenosis, unchanged.  L3-L4: Worsened, moderate spinal canal stenosis due to combination of disc bulge and facet hypertrophy. Mild right and moderate left neural foraminal stenosis is unchanged.  L4-L5: Grade 1 anterolisthesis with pseudo disc bulge and severe bilateral facet hypertrophy, unchanged. Severe spinal canal stenosis and severe bilateral neural foraminal stenosis is unchanged.  L5-S1: Disc space narrowing with small bulge and moderate bilateral facet hypertrophy, unchanged. Moderate spinal canal stenosis and bilateral neural foraminal stenosis, unchanged.  Visualized sacrum: Normal.  IMPRESSION: 1. Worsening of  degenerative disc disease at L2-L3 and L3-L4 with moderate spinal canal stenosis at both levels and moderate bilateral L2 and left L3 neural foraminal stenosis. 2. Severe spinal canal stenosis and bilateral neural foraminal stenosis at L4-L5 with grade 1 anterolisthesis and severe facet arthrosis, unchanged. 3. Unchanged moderate spinal canal stenosis at L5-S1 with moderate bilateral neural foraminal stenosis and moderate facet arthrosis. 4. Unchanged severe bilateral L1 neural foraminal stenosis.   Electronically Signed   By: Ulyses Jarred M.D.   On: 08/07/2017 20:23     Objective:  VS:  HT:    WT:   BMI:     BP:120/66  HR:(!) 102bpm  TEMP:98.7 F (37.1 C)(Temporal)  RESP:  Physical Exam  Ortho Exam Imaging: Xr C-arm No Report  Result Date: 03/21/2019 Please see Notes tab for imaging impression.

## 2019-03-22 NOTE — Procedures (Signed)
Lumbosacral Transforaminal Epidural Steroid Injection - Sub-Pedicular Approach with Fluoroscopic Guidance  Patient: [name]      Date of Birth: 1941/07/07 MRN: [id number] PCP: Cari Caraway, MD      Visit Date: 03/21/2019   Universal Protocol:    Date/Time: 03/21/2019  Consent Given By: the patient  Position: PRONE  Additional Comments: Vital signs were monitored before and after the procedure. Patient was prepped and draped in the usual sterile fashion. The correct patient, procedure, and site was verified.   Injection Procedure Details:  Procedure Site One Meds Administered:  Meds ordered this encounter  Medications  . dexamethasone (DECADRON) injection 15 mg    Laterality: Bilateral  Location/Site:  L3-L4  Needle size: 22 G  Needle type: Spinal  Needle Placement: Transforaminal  Findings:    -Comments: Excellent flow of contrast along the nerve and into the epidural space.  Procedure Details: After squaring off the end-plates to get a true AP view, the C-arm was positioned so that an oblique view of the foramen as noted above was visualized. The target area is just inferior to the "nose of the scotty dog" or sub pedicular. The soft tissues overlying this structure were infiltrated with 2-3 ml. of 1% Lidocaine without Epinephrine.  The spinal needle was inserted toward the target using a "trajectory" view along the fluoroscope beam.  Under AP and lateral visualization, the needle was advanced so it did not puncture dura and was located close the 6 O'Clock position of the pedical in AP tracterory. Biplanar projections were used to confirm position. Aspiration was confirmed to be negative for CSF and/or blood. A 1-2 ml. volume of Isovue-250 was injected and flow of contrast was noted at each level. Radiographs were obtained for documentation purposes.   After attaining the desired flow of contrast documented above, a 0.5 to 1.0 ml test dose of 0.25% Marcaine  was injected into each respective transforaminal space.  The patient was observed for 90 seconds post injection.  After no sensory deficits were reported, and normal lower extremity motor function was noted,   the above injectate was administered so that equal amounts of the injectate were placed at each foramen (level) into the transforaminal epidural space.   Additional Comments:  The patient tolerated the procedure well Dressing: 2 x 2 sterile gauze and Band-Aid    Post-procedure details: Patient was observed during the procedure. Post-procedure instructions were reviewed.  Patient left the clinic in stable condition.

## 2019-04-19 ENCOUNTER — Other Ambulatory Visit: Payer: Self-pay | Admitting: Pediatric Intensive Care

## 2019-04-19 DIAGNOSIS — Z20822 Contact with and (suspected) exposure to covid-19: Secondary | ICD-10-CM

## 2019-04-20 DIAGNOSIS — L570 Actinic keratosis: Secondary | ICD-10-CM | POA: Diagnosis not present

## 2019-04-20 DIAGNOSIS — L821 Other seborrheic keratosis: Secondary | ICD-10-CM | POA: Diagnosis not present

## 2019-04-20 DIAGNOSIS — D229 Melanocytic nevi, unspecified: Secondary | ICD-10-CM | POA: Diagnosis not present

## 2019-04-21 ENCOUNTER — Telehealth: Payer: Self-pay | Admitting: Pediatric Intensive Care

## 2019-04-21 LAB — NOVEL CORONAVIRUS, NAA: SARS-CoV-2, NAA: NOT DETECTED

## 2019-04-21 NOTE — Telephone Encounter (Signed)
Pt returned call and I advised him he was negative for COVID19/not detected

## 2019-04-25 DIAGNOSIS — M26622 Arthralgia of left temporomandibular joint: Secondary | ICD-10-CM | POA: Diagnosis not present

## 2019-04-25 DIAGNOSIS — K219 Gastro-esophageal reflux disease without esophagitis: Secondary | ICD-10-CM | POA: Diagnosis not present

## 2019-04-25 DIAGNOSIS — F418 Other specified anxiety disorders: Secondary | ICD-10-CM | POA: Diagnosis not present

## 2019-04-25 DIAGNOSIS — E782 Mixed hyperlipidemia: Secondary | ICD-10-CM | POA: Diagnosis not present

## 2019-04-25 DIAGNOSIS — M48062 Spinal stenosis, lumbar region with neurogenic claudication: Secondary | ICD-10-CM | POA: Diagnosis not present

## 2019-04-25 DIAGNOSIS — Z79899 Other long term (current) drug therapy: Secondary | ICD-10-CM | POA: Diagnosis not present

## 2019-04-25 DIAGNOSIS — R7303 Prediabetes: Secondary | ICD-10-CM | POA: Diagnosis not present

## 2019-04-25 DIAGNOSIS — R0602 Shortness of breath: Secondary | ICD-10-CM | POA: Diagnosis not present

## 2019-05-02 ENCOUNTER — Telehealth: Payer: Self-pay | Admitting: Family Medicine

## 2019-05-02 NOTE — Telephone Encounter (Signed)
Called in about getting MRI scheduled

## 2019-05-03 NOTE — Telephone Encounter (Signed)
I called and sw pt and he stated he called [facility] to schedule appt for MRI and they told him he had to contact us so we can call them, I called [facility] and sw Manus Gunning and she will contact pt to schedule appt

## 2019-05-04 DIAGNOSIS — R140 Abdominal distension (gaseous): Secondary | ICD-10-CM | POA: Diagnosis not present

## 2019-05-04 DIAGNOSIS — R5383 Other fatigue: Secondary | ICD-10-CM | POA: Diagnosis not present

## 2019-05-04 DIAGNOSIS — K219 Gastro-esophageal reflux disease without esophagitis: Secondary | ICD-10-CM | POA: Diagnosis not present

## 2019-05-23 DIAGNOSIS — K219 Gastro-esophageal reflux disease without esophagitis: Secondary | ICD-10-CM | POA: Diagnosis not present

## 2019-05-23 DIAGNOSIS — E291 Testicular hypofunction: Secondary | ICD-10-CM | POA: Diagnosis not present

## 2019-05-23 DIAGNOSIS — F418 Other specified anxiety disorders: Secondary | ICD-10-CM | POA: Diagnosis not present

## 2019-05-23 DIAGNOSIS — E782 Mixed hyperlipidemia: Secondary | ICD-10-CM | POA: Diagnosis not present

## 2019-05-23 DIAGNOSIS — J301 Allergic rhinitis due to pollen: Secondary | ICD-10-CM | POA: Diagnosis not present

## 2019-05-23 DIAGNOSIS — M26622 Arthralgia of left temporomandibular joint: Secondary | ICD-10-CM | POA: Diagnosis not present

## 2019-05-23 DIAGNOSIS — N401 Enlarged prostate with lower urinary tract symptoms: Secondary | ICD-10-CM | POA: Diagnosis not present

## 2019-05-23 DIAGNOSIS — M48062 Spinal stenosis, lumbar region with neurogenic claudication: Secondary | ICD-10-CM | POA: Diagnosis not present

## 2019-05-23 DIAGNOSIS — R0602 Shortness of breath: Secondary | ICD-10-CM | POA: Diagnosis not present

## 2019-05-25 ENCOUNTER — Ambulatory Visit
Admission: RE | Admit: 2019-05-25 | Discharge: 2019-05-25 | Disposition: A | Discharge status: Home / Self Care | Payer: Medicare Other | Source: Ambulatory Visit | Attending: Family Medicine | Admitting: Family Medicine

## 2019-05-25 DIAGNOSIS — M25511 Pain in right shoulder: Secondary | ICD-10-CM

## 2019-05-25 DIAGNOSIS — G8929 Other chronic pain: Secondary | ICD-10-CM

## 2019-05-26 ENCOUNTER — Telehealth: Payer: Self-pay | Admitting: Family Medicine

## 2019-05-26 NOTE — Telephone Encounter (Signed)
MRI shows some arthritis in the shoulder, and also a torn rotator cuff tendon.    Options include surgical consult for rotator cuff repair, or possibly trial of glenohumeral injection.

## 2019-05-26 NOTE — Telephone Encounter (Signed)
I called and gave the patient his MRI results. He is opting for the [facility] injection. He is also continuing to have pain in his foot and would like this addressed again at the visit. I scheduled an appointment for tomorrow afternoon with Dr. Junius Roads.

## 2019-05-27 ENCOUNTER — Ambulatory Visit (INDEPENDENT_AMBULATORY_CARE_PROVIDER_SITE_OTHER): Payer: Medicare Other | Admitting: Family Medicine

## 2019-05-27 ENCOUNTER — Other Ambulatory Visit: Payer: Self-pay

## 2019-05-27 ENCOUNTER — Encounter: Payer: Self-pay | Admitting: Family Medicine

## 2019-05-27 DIAGNOSIS — G8929 Other chronic pain: Secondary | ICD-10-CM

## 2019-05-27 DIAGNOSIS — M19079 Primary osteoarthritis, unspecified ankle and foot: Secondary | ICD-10-CM | POA: Diagnosis not present

## 2019-05-27 DIAGNOSIS — M79672 Pain in left foot: Secondary | ICD-10-CM

## 2019-05-27 DIAGNOSIS — M25511 Pain in right shoulder: Secondary | ICD-10-CM

## 2019-05-27 NOTE — Progress Notes (Signed)
Office Visit Note   Patient: [name]           Date of Birth: 1941/09/02           MRN: [id number] Visit Date: 05/27/2019 Requested by: Cari Caraway, MD [place],  [place] [address] PCP: Cari Caraway, MD  Subjective: No chief complaint on file.   HPI: He is here with a couple concerns.  He is here to discuss MRI results right shoulder.  MRI showed partial rotator cuff tear as well as glenohumeral arthritis and degenerative labrum tear.  He would like to try another injection prior to surgery.  He has been seen here and at podiatry for chronic bilateral foot pain.  His left foot is hurting quite a bit, very difficult to put weight on it.  Pain seems to be in the midfoot radiating toward the toes.               ROS: No fever or chills.  All other systems were reviewed and are negative.  Objective: Vital Signs: There were no vitals taken for this visit.  Physical Exam:  General:  Alert and oriented, in no acute distress. Pulm:  Breathing unlabored. Psy:  Normal mood, congruent affect. Skin: No skin breakdown or rash. Right shoulder: Still has pretty good range of motion.  Pain with empty can test but adequate rotator cuff strength. Left foot: Pain seems to be most pronounced at the third and fourth TMT joints.   Imaging: None today.  Old foot x-rays show collapse of longitudinal arch with midfoot DJD.  Assessment & Plan: 1.  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  If no improvement, then possible surgery per Dr. Marlou Sa.  2.  Chronic left foot pain most likely related to midfoot DJD -MRI to further evaluate.     Procedures: Ultrasound guided right glenohumeral injection: After sterile prep with Betadine, injected 8 cc 1% lidocaine without epinephrine and 40 mg methylprednisolone from posterior approach into the glenohumeral joint.   Injectate was seen filling the joint capsule.  He had modest improvement in pain during the anesthetic phase.    PMFS History: Patient Active Problem List   Diagnosis Date Noted  . Spinal stenosis of lumbar region with neurogenic claudication 01/15/2017  . Spondylolisthesis of lumbar region 01/15/2017  . Claw toe, acquired, left 01/06/2017  . Achilles tendon contracture, bilateral 01/06/2017  . Pain in metatarsus of both feet 01/06/2017  . Tubular adenoma of colon 12/29/2016  . Basal cell carcinoma 08/19/2016  . Umbilical hernia s/p lap repair with mesh 06/27/2016 06/27/2016  . Degenerative arthritis of knee 03/22/2015  . Dyspnea on exertion 01/31/2015  . Lumbar radiculopathy 12/12/2014  . Facet hypertrophy of lumbar region 12/12/2014  . Left knee pain 12/12/2014  . Ulcer of lower limb ([place]) 01/10/2014  . Varicose veins of lower extremities with other complications 00/93/8182  . 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 Renal insufficiency 12/21/2013  . Swelling in head/neck 07/28/2012  . Venous (peripheral) insufficiency 05/25/2012  . 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 Pain in limb 02/12/2012  . Hyperlipidemia 02/28/2008  . Allergic rhinitis 02/28/2008  . Sleep apnea 02/28/2008   Past Medical History:  Diagnosis Date  . Allergy    takes Zyrtec daily  . Anxiety    takes Ativan daily as needed  . Arthritis   . Back pain   .  BPH (benign prostatic hyperplasia)    takes doxazosin for  . 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 Depression    takes Wellbutrin daily  . Dyspnea on exertion    with exertion  . GERD (gastroesophageal reflux disease)    takes Nexium and Omeprazole daily  . History of kidney stones   . Hx of [facility] spotted fever childhood  . Hyperlipidemia   . Neuromuscular disorder ([place])   . Sleep apnea   . Swelling of lower extremity    right more than leg leg  . Umbilical hernia     Family History   Problem Relation Age of Onset  . Thyroid disease Mother   . Heart disease Father   . Diabetes Maternal Grandfather     Past Surgical History:  Procedure Laterality Date  . 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 BACK SURGERY  2003   L4, L5  . BACK SURGERY    . ENDOVENOUS ABLATION SAPHENOUS VEIN W/ LASER Right 06-01-2014   EVLA right greater saphenous vein by Curt Jews MD   . epidural steroid injections        [facility] dr newton  . hernia repair  2003  . HERNIA REPAIR     hernia inguinal x 2  . TOTAL KNEE ARTHROPLASTY Left 03/22/2015   Procedure: TOTAL KNEE ARTHROPLASTY;  Surgeon: Meredith Pel, MD;  Location: [facility];  Service: Orthopedics;  Laterality: Left;  . UMBILICAL HERNIA REPAIR N/A 06/27/2016   Procedure: LAPAROSCOPIC ASSISTED REPAIR OF UMBILICAL HERINA WITH MESH;  Surgeon: Johnathan Hausen, MD;  Location: [facility];  Service: General;  Laterality: N/A;  . VASCULAR SURGERY  2015   right leg   Social History   Occupational History  . 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 Smoking status: Never Smoker  . Smokeless tobacco: Never Used  Substance and Sexual Activity  . Alcohol use: No  . Drug use: No  . Sexual activity: Yes    Birth control/protection: Condom

## 2019-05-30 ENCOUNTER — Other Ambulatory Visit: Payer: Self-pay | Admitting: *Deleted

## 2019-05-30 DIAGNOSIS — Z20822 Contact with and (suspected) exposure to covid-19: Secondary | ICD-10-CM

## 2019-06-05 LAB — NOVEL CORONAVIRUS, NAA: SARS-CoV-2, NAA: NOT DETECTED

## 2019-06-13 ENCOUNTER — Telehealth: Payer: Self-pay | Admitting: Radiology

## 2019-06-13 NOTE — Telephone Encounter (Signed)
This has been received, signed, and given to Tammy in Medical Records to fax back.

## 2019-06-13 NOTE — Telephone Encounter (Signed)
Received voicemail on triage line from [place] with [facility]. He states this is a mutual patient and he has faxed paperwork this morning that would need to be completed by Dr. Junius Roads and faxed back as soon as possible.  If we did not receive paperwork, [name] would like a return call to [phone].

## 2019-06-20 ENCOUNTER — Telehealth: Payer: Self-pay

## 2019-06-20 DIAGNOSIS — C44311 Basal cell carcinoma of skin of nose: Secondary | ICD-10-CM | POA: Diagnosis not present

## 2019-06-20 DIAGNOSIS — L570 Actinic keratosis: Secondary | ICD-10-CM | POA: Diagnosis not present

## 2019-06-20 NOTE — Telephone Encounter (Signed)
Will you take care of this please?

## 2019-06-20 NOTE — Telephone Encounter (Signed)
[name] with [facility] would like last office note faxed to [phone].  Cb# is [phone].  Please advise.  Thank you.

## 2019-06-21 ENCOUNTER — Ambulatory Visit: Payer: Medicare Other | Admitting: Cardiovascular Disease

## 2019-06-21 NOTE — Telephone Encounter (Signed)
faxed

## 2019-07-01 ENCOUNTER — Other Ambulatory Visit: Payer: Self-pay

## 2019-07-01 ENCOUNTER — Telehealth: Payer: Self-pay | Admitting: Family Medicine

## 2019-07-01 ENCOUNTER — Ambulatory Visit
Admission: RE | Admit: 2019-07-01 | Discharge: 2019-07-01 | Disposition: A | Discharge status: Home / Self Care | Payer: Medicare Other | Source: Ambulatory Visit | Attending: Family Medicine | Admitting: Family Medicine

## 2019-07-01 DIAGNOSIS — M19072 Primary osteoarthritis, left ankle and foot: Secondary | ICD-10-CM | POA: Diagnosis not present

## 2019-07-01 DIAGNOSIS — Z20822 Contact with and (suspected) exposure to covid-19: Secondary | ICD-10-CM

## 2019-07-01 DIAGNOSIS — M19079 Primary osteoarthritis, unspecified ankle and foot: Secondary | ICD-10-CM

## 2019-07-01 NOTE — Telephone Encounter (Signed)
Foot MRI shows lots of arthritis in the midfoot, but there also appears to be a stress fracture in the 4th metatarsal bone.  I recommend protecting the foot in a short fracture boot for about 3-6 weeks to let the bone heal.  If midfoot pain persists after that, then we'll consider a cortisone injection.  But for now, will avoid cortisone since it can delay stress fracture healing.

## 2019-07-02 LAB — NOVEL CORONAVIRUS, NAA: SARS-CoV-2, NAA: NOT DETECTED

## 2019-07-04 NOTE — Telephone Encounter (Signed)
I called the patient with his MRI results. He will be moving stuff out of a warehouse over the next 4-5 weeks. He would like to have his right knee checked again/possible cortisone injection, and get the fracture boot. He would also like to get a refill on an antiinflammatory (etodolac vs meloxicam) for his shoulder, foot and allover body aches. Will discuss this tomorrow. I will check [facility] in the meantime, to see which is more affordable for him.

## 2019-07-05 ENCOUNTER — Other Ambulatory Visit: Payer: Medicare Other

## 2019-07-05 ENCOUNTER — Ambulatory Visit: Payer: Medicare Other

## 2019-07-05 ENCOUNTER — Ambulatory Visit (INDEPENDENT_AMBULATORY_CARE_PROVIDER_SITE_OTHER): Payer: Medicare Other | Admitting: Family Medicine

## 2019-07-05 ENCOUNTER — Encounter: Payer: Self-pay | Admitting: Family Medicine

## 2019-07-05 DIAGNOSIS — M79672 Pain in left foot: Secondary | ICD-10-CM

## 2019-07-05 DIAGNOSIS — G8929 Other chronic pain: Secondary | ICD-10-CM

## 2019-07-05 DIAGNOSIS — M25561 Pain in right knee: Secondary | ICD-10-CM | POA: Diagnosis not present

## 2019-07-05 MED ORDER — MELOXICAM 15 MG PO TABS: 7.5000 mg | ORAL_TABLET | Freq: Every day | ORAL | 3 refills | Status: DC | PRN

## 2019-07-05 NOTE — Progress Notes (Signed)
I saw and examined the patient with Dr. Mayer Masker and agree with assessment and plan as outlined.  Left knee injected today.  Fracture boot applied to right foot.  Return in 4-6 weeks for recheck of foot.

## 2019-07-05 NOTE — Progress Notes (Signed)
[name] - 78 y.o. male MRN [id number]  Date of birth: [birth year]  Office Visit Note: Visit Date: 07/05/2019 PCP: Cari Caraway, MD Referred by: Cari Caraway, MD  Subjective: Chief Complaint  Patient presents with  . Right Foot - Pain    Stress fracture in 4th metatarsal, on MRI.   . Right Knee - Pain   HPI: [name] is a 78 y.o. male who comes in today for follow up of left foot pain as well as right knee pain.   He was seen on 7/17 for chronic bilateral foot pain with left pain worse than right and difficulty bearing weight on left foot. MRI was obtained which revealed arthritis in midfoot as well as a stress fracture in his 4th metatarsal. He would like alternatives to wearing a short fracture boot as he will be moving stuff out of a warehouse the next 4-5 weeks.   He also has arthritis in his right knee and would like a repeat corticosteroid injection as this has helped in the past. Currently having pain with walking and stairs.  Requesting refill of meloxicam.   ROS Otherwise per HPI.  Assessment & Plan: Visit Diagnoses:  1. Chronic pain of right knee   2. Left foot pain     Plan: - short fracture boot, wear with activity for 3-6 weeks to allow bone to heal - corticosteroid injection in right knee  - will order bone density studies given history of frequent steroid injections in spine, etc   Meds & Orders:  Meds ordered this encounter  Medications  . meloxicam (MOBIC) 15 MG tablet    Sig: Take 0.5-1 tablets (7.5-15 mg total) by mouth daily as needed for pain.    Dispense:  90 tablet    Refill:  3   No orders of the defined types were placed in this encounter.   Follow-up: No follow-ups on file.   Procedures: Procedure performed: knee intraarticular corticosteroid injection; palpation guided  Consent obtained and verified. Time-out conducted. Noted no overlying erythema, induration, or other signs of local infection. The superolateral right  knee was palpated and marked. The overlying skin was prepped in a sterile fashion. Topical analgesic spray: Ethyl chloride. Joint: right knee Needle: 25 gauge, 1.5 " Completed without difficulty. Meds: 3 cc 1% lidocaine, 40 mg methylprednisolone   Advised to call if fevers/chills, erythema, induration, drainage, or persistent bleeding.   Clinical History: No specialty comments available.   He reports that he has never smoked. He has never used smokeless tobacco. No results for input(s): HGBA1C, LABURIC in the last 8760 hours.  Objective:  VS:  HT:    WT:   BMI:     BP:   HR: bpm  TEMP: ( )  RESP:  Physical Exam  PHYSICAL EXAM: Gen: NAD, alert, cooperative with exam, well-appearing HEENT: clear conjunctiva,  CV:  no edema, capillary refill brisk, normal rate Resp: non-labored Skin: no rashes, normal turgor  Neuro: no gross deficits.  Psych:  alert and oriented  Ortho Exam  Right Knee: - Inspection: Mild effusion, no erythema or bruising. Skin intact - Palpation: mild TTP over medial joint line - ROM: full active ROM with flexion and extension in knee. Crepitus with movement  - Strength: 5/5 strength - Neuro/vasc: NV intact - Special Tests: - LIGAMENTS: negative anterior and posterior drawer, no MCL or LCL laxity  -- PF JOINT: nml patellar mobility bilaterally  Left foot: TTP at 4th tarsal-metatarsal joint Full ROM of  foot  Imaging: No results found.  Past Medical/Family/Surgical/Social History: Medications & Allergies reviewed per EMR, new medications updated. Patient Active Problem List   Diagnosis Date Noted  . Spinal stenosis of lumbar region with neurogenic claudication 01/15/2017  . Spondylolisthesis of lumbar region 01/15/2017  . Claw toe, acquired, left 01/06/2017  . Achilles tendon contracture, bilateral 01/06/2017  . Pain in metatarsus of both feet 01/06/2017  . Tubular adenoma of colon 12/29/2016  . Basal cell carcinoma 08/19/2016  . Umbilical  hernia s/p lap repair with mesh 06/27/2016 06/27/2016  . Degenerative arthritis of knee 03/22/2015  . Dyspnea on exertion 01/31/2015  . Lumbar radiculopathy 12/12/2014  . Facet hypertrophy of lumbar region 12/12/2014  . Left knee pain 12/12/2014  . Ulcer of lower limb ([place]) 01/10/2014  . Varicose veins of lower extremities with other complications [id number]  . 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 Renal insufficiency 12/21/2013  . Swelling in head/neck 07/28/2012  . Venous (peripheral) insufficiency 05/25/2012  . 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 Pain in limb 02/12/2012  . Hyperlipidemia 02/28/2008  . Allergic rhinitis 02/28/2008  . Sleep apnea 02/28/2008   Past Medical History:  Diagnosis Date  . Allergy    takes Zyrtec daily  . Anxiety    takes Ativan daily as needed  . Arthritis   . Back pain   . BPH (benign prostatic hyperplasia)    takes doxazosin for  . 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 Depression    takes Wellbutrin daily  . Dyspnea on exertion    with exertion  . GERD (gastroesophageal reflux disease)    takes Nexium and Omeprazole daily  . History of kidney stones   . 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 Hyperlipidemia   . Neuromuscular disorder ([place])   . Sleep apnea   . Swelling of lower extremity    right more than leg leg  . Umbilical hernia    Family History  Problem Relation Age of Onset  . Thyroid disease Mother   . Heart disease Father   . Diabetes Maternal Grandfather    Past Surgical History:  Procedure Laterality Date  . 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 BACK SURGERY  2003   L4, L5  . BACK SURGERY    . ENDOVENOUS ABLATION SAPHENOUS VEIN W/ LASER Right 06-01-2014   EVLA right greater saphenous vein by Curt Jews MD   . epidural steroid injections        [facility] dr new[facility]  . hernia repair  2003  . HERNIA REPAIR     hernia inguinal x 2  .  TOTAL KNEE ARTHROPLASTY Left 03/22/2015   Procedure: TOTAL KNEE ARTHROPLASTY;  Surgeon: Meredith Pel, MD;  Location: [facility];  Service: Orthopedics;  Laterality: Left;  . UMBILICAL HERNIA REPAIR N/A 06/27/2016   Procedure: LAPAROSCOPIC ASSISTED REPAIR OF UMBILICAL HERINA WITH MESH;  Surgeon: Johnathan Hausen, MD;  Location: [facility];  Service: General;  Laterality: N/A;  . VASCULAR SURGERY  2015   right leg   Social History   Occupational History  . 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 Smoking status: Never Smoker  . Smokeless tobacco: Never Used  Substance and Sexual Activity  . Alcohol use: No  . Drug use: No  . Sexual activity: Yes    Birth control/protection: Condom

## 2019-07-06 ENCOUNTER — Other Ambulatory Visit: Payer: Self-pay | Admitting: Family Medicine

## 2019-07-06 DIAGNOSIS — C44311 Basal cell carcinoma of skin of nose: Secondary | ICD-10-CM | POA: Diagnosis not present

## 2019-07-06 DIAGNOSIS — M84376G Stress fracture, unspecified foot, subsequent encounter for fracture with delayed healing: Secondary | ICD-10-CM

## 2019-07-06 DIAGNOSIS — M85871 Other specified disorders of bone density and structure, right ankle and foot: Secondary | ICD-10-CM

## 2019-07-12 DIAGNOSIS — M48062 Spinal stenosis, lumbar region with neurogenic claudication: Secondary | ICD-10-CM | POA: Diagnosis not present

## 2019-07-12 DIAGNOSIS — M25511 Pain in right shoulder: Secondary | ICD-10-CM | POA: Diagnosis not present

## 2019-07-12 DIAGNOSIS — M1711 Unilateral primary osteoarthritis, right knee: Secondary | ICD-10-CM | POA: Diagnosis not present

## 2019-07-12 DIAGNOSIS — M50122 Cervical disc disorder at C5-C6 level with radiculopathy: Secondary | ICD-10-CM | POA: Diagnosis not present

## 2019-07-12 DIAGNOSIS — Z23 Encounter for immunization: Secondary | ICD-10-CM | POA: Diagnosis not present

## 2019-07-14 DIAGNOSIS — M1711 Unilateral primary osteoarthritis, right knee: Secondary | ICD-10-CM | POA: Diagnosis not present

## 2019-07-14 DIAGNOSIS — M50122 Cervical disc disorder at C5-C6 level with radiculopathy: Secondary | ICD-10-CM | POA: Diagnosis not present

## 2019-07-14 DIAGNOSIS — M48062 Spinal stenosis, lumbar region with neurogenic claudication: Secondary | ICD-10-CM | POA: Diagnosis not present

## 2019-07-14 DIAGNOSIS — M25511 Pain in right shoulder: Secondary | ICD-10-CM | POA: Diagnosis not present

## 2019-07-19 DIAGNOSIS — M50122 Cervical disc disorder at C5-C6 level with radiculopathy: Secondary | ICD-10-CM | POA: Diagnosis not present

## 2019-07-19 DIAGNOSIS — M48062 Spinal stenosis, lumbar region with neurogenic claudication: Secondary | ICD-10-CM | POA: Diagnosis not present

## 2019-07-19 DIAGNOSIS — M25511 Pain in right shoulder: Secondary | ICD-10-CM | POA: Diagnosis not present

## 2019-07-19 DIAGNOSIS — M1711 Unilateral primary osteoarthritis, right knee: Secondary | ICD-10-CM | POA: Diagnosis not present

## 2019-07-21 DIAGNOSIS — M48062 Spinal stenosis, lumbar region with neurogenic claudication: Secondary | ICD-10-CM | POA: Diagnosis not present

## 2019-07-21 DIAGNOSIS — M50122 Cervical disc disorder at C5-C6 level with radiculopathy: Secondary | ICD-10-CM | POA: Diagnosis not present

## 2019-07-21 DIAGNOSIS — M1711 Unilateral primary osteoarthritis, right knee: Secondary | ICD-10-CM | POA: Diagnosis not present

## 2019-07-21 DIAGNOSIS — M25511 Pain in right shoulder: Secondary | ICD-10-CM | POA: Diagnosis not present

## 2019-08-01 ENCOUNTER — Ambulatory Visit: Payer: Medicare Other | Admitting: Cardiology

## 2019-08-02 DIAGNOSIS — M48062 Spinal stenosis, lumbar region with neurogenic claudication: Secondary | ICD-10-CM | POA: Diagnosis not present

## 2019-08-02 DIAGNOSIS — M1711 Unilateral primary osteoarthritis, right knee: Secondary | ICD-10-CM | POA: Diagnosis not present

## 2019-08-02 DIAGNOSIS — M50122 Cervical disc disorder at C5-C6 level with radiculopathy: Secondary | ICD-10-CM | POA: Diagnosis not present

## 2019-08-02 DIAGNOSIS — M25511 Pain in right shoulder: Secondary | ICD-10-CM | POA: Diagnosis not present

## 2019-08-04 ENCOUNTER — Encounter: Payer: Self-pay | Admitting: Cardiovascular Disease

## 2019-08-04 ENCOUNTER — Other Ambulatory Visit: Payer: Self-pay

## 2019-08-04 ENCOUNTER — Ambulatory Visit (INDEPENDENT_AMBULATORY_CARE_PROVIDER_SITE_OTHER): Payer: Medicare Other | Admitting: Cardiovascular Disease

## 2019-08-04 VITALS — BP 159/79 | HR 80 | Ht 69.5 in | Wt 254.0 lb

## 2019-08-04 DIAGNOSIS — R0600 Dyspnea, unspecified: Secondary | ICD-10-CM | POA: Diagnosis not present

## 2019-08-04 DIAGNOSIS — R0602 Shortness of breath: Secondary | ICD-10-CM | POA: Diagnosis not present

## 2019-08-04 NOTE — Assessment & Plan Note (Signed)
History of dyspnea on exertion of unclear etiology.  I did perform cardiac catheterization on him 06/17/2007 which was entirely normal.  2D echo is abnormal as well.  There is no history of tobacco abuse.  I am going refer him to a pulmonologist for further evaluation.

## 2019-08-04 NOTE — Assessment & Plan Note (Signed)
History of obstructive sleep apnea on CPAP. 

## 2019-08-04 NOTE — Patient Instructions (Addendum)
Medication Instructions:  Your physician recommends that you continue on your current medications as directed. Please refer to the Current Medication list given to you today.  If you need a refill on your cardiac medications before your next appointment, please call your pharmacy.   Lab work: none If you have labs (blood work) drawn today and your tests are completely normal, you will receive your results only by: [name] MyChart Message (if you have MyChart) OR . A paper copy in the mail If you have any lab test that is abnormal or we need to change your treatment, we will call you to review the results.  Testing/Procedures: Your physician has requested that you have an echocardiogram. Echocardiography is a painless test that uses sound waves to create images of your heart. It provides your doctor with information about the size and shape of your heart and how well your heart's chambers and valves are working. This procedure takes approximately one hour. There are no restrictions for this procedure. LOCATION: [facility]: [name], [name], [name] [address] TO BE SCHEDULED   Follow-Up: At [facility], you and your health needs are our priority.  As part of our continuing mission to provide you with exceptional heart care, we have created designated Provider Care Teams. These Care Teams include your primary Cardiologist (physician) and Advanced Practice Providers (APPs -  Physician Assistants and Nurse Practitioners) who all work together to provide you with the care you need, when you need it. You will need a follow up appointment in 12 months with [name].  Please call our office 2 months in advance to schedule this/each appointment.    Any Other Special Instructions Will Be Listed Below (If Applicable). REFERRAL TO DR. [name] PULMONOLOGY

## 2019-08-04 NOTE — Assessment & Plan Note (Signed)
History of hyperlipidemia on statin therapy with lipid profile performed 04/25/2019 revealing a total cholesterol 113, LDL 49 and HDL 36.

## 2019-08-04 NOTE — Progress Notes (Signed)
08/04/2019 [name]   June 27, 1941  [id number]  Primary Physician Cari Caraway, MD Primary Cardiologist: Lorretta Harp MD Lupe Carney, [place]  HPI:  [name] is a 78 y.o.  moderately overweight divorced Caucasian male father of 3 living children (1 daughter committed suicide since I saw him last) who is a patient of Dr. Everlene Farrier and was referred back for evaluation of dyspnea. I last saw him in the office  12/23/2016. His cardiac risk factor profile is notable for mild hyperlipidemia and family history the father who had an open heart surgery in his 62s. Because of chest pain and a mildly abnormal Cardiolite I performed cardiac catheterization on him 8//7/08 which was entirely normal. He does have problems with his back and knee. He's had venous stripping by Dr. Donnetta Hutching for reflux and swelling last year. He's been diagnosed with obstructive sleep apnea where C Pap which is beneficial for him.  He does not exercise because of physical limitations regarding his back and knees.  Since I saw him 2-1/2 years ago he continues to be limited by dyspnea as well as joint pain.  His last 2D echo performed 02/18/2017 revealed normal LV systolic function with grade 1 diastolic dysfunction.  He really denies chest pain.  There is no history of tobacco abuse.  Current Meds  Medication Sig  . B-D 3CC LUER-LOK SYR 21GX1-1/2 21G X 1-1/2" 3 ML MISC USE TO INJECT DEPOTESTOSTERONE.  [name] buPROPion (WELLBUTRIN XL) 300 MG 24 hr tablet TAKE 1 TABLET BY MOUTH DAILY *NEEDS OFFICE VISIT  . calcium carbonate 200 MG capsule Take 200 mg by mouth daily.   . cetirizine (ZYRTEC) 10 MG tablet Take 10 mg by mouth every morning.   . citalopram (CELEXA) 10 MG tablet Take 10 mg by mouth daily.  [name] doxazosin (CARDURA) 4 MG tablet Take 4 mg by mouth daily.  [name] doxazosin (CARDURA) 8 MG tablet TAKE 1 TABLET BY MOUTH DAILY  . Take at night (Patient taking differently: every evening. TAKE 1 TABLET BY MOUTH DAILY  . Take at  night)  . esomeprazole (NEXIUM) 20 MG capsule Take 1 capsule (20 mg total) by mouth daily.  [name] etodolac (LODINE) 400 MG tablet Take 1 tablet (400 mg total) by mouth 2 (two) times daily as needed.  . flurazepam (DALMANE) 15 MG capsule Take 1 capsule (15 mg total) by mouth at bedtime as needed for sleep.  . fluticasone (FLONASE) 50 MCG/ACT nasal spray Place 2 sprays into both nostrils daily.  . Fluticasone-Salmeterol (ADVAIR) 250-50 MCG/DOSE AEPB   . furosemide (LASIX) 20 MG tablet TAKE 2 TABLETS BY MOUTH ONCE A DAY  . gabapentin (NEURONTIN) 300 MG capsule TAKE 1 TO 2 CAPSULES BY MOUTH AT BEDTIME  . glucosamine-chondroitin 500-400 MG tablet Take 1 tablet by mouth 2 (two) times daily.  [name] HYDROcodone-acetaminophen (NORCO) 5-325 MG tablet 1 po q d prn pain  . ipratropium (ATROVENT) 0.06 % nasal spray   . LORazepam (ATIVAN) 0.5 MG tablet Take 1 tablet as needed for stress on a daily basis. Not to be taken with pain meds  . LORazepam (ATIVAN) 1 MG tablet TAKE 1 TABLET BY MOUTH TWICE DAILY AS NEEDED FOR 30 DAYS  . meloxicam (MOBIC) 15 MG tablet Take 0.5-1 tablets (7.5-15 mg total) by mouth daily as needed for pain.  . meloxicam (MOBIC) 7.5 MG tablet TAKE 1-2 TABLETS BY MOUTH DAILY  . methocarbamol (ROBAXIN) 500 MG tablet Take 1 tablet (500 mg total) by  mouth every 6 (six) hours as needed for muscle spasms.  . Multiple Vitamin (MULTIVITAMIN) tablet Take 1 tablet by mouth daily.  [name] NEEDLE, DISP, 21 G 21G X 1-1/2" MISC Use to inject depotestosterone.  . Potassium Chloride ER 20 MEQ TBCR Take 1 tablet by mouth 2 (two) times daily with a meal.  . potassium chloride SA (K-DUR,KLOR-CON) 20 MEQ tablet Take 1 tablet (20 mEq total) by mouth 2 (two) times daily.  [name] PROAIR HFA 108 (90 Base) MCG/ACT inhaler   . rosuvastatin (CRESTOR) 5 MG tablet Take 5 mg by mouth daily.  . sildenafil (REVATIO) 20 MG tablet Take 5 tablets daily as needed for erectile dysfunction.  . Syringe, Reusable, 3 ML MISC Use to inject  depotestosterone.  . testosterone cypionate (DEPOTESTOTERONE CYPIONATE) 100 MG/ML injection Inject 1 mL (100 mg total) into the muscle every 7 (seven) days. For IM use only   Current Facility-Administered Medications for the 08/04/19 encounter (Office Visit) with Lorretta Harp, MD  Medication  . methylPREDNISolone acetate (DEPO-MEDROL) injection 40 mg  . methylPREDNISolone acetate (DEPO-MEDROL) injection 40 mg     No Known Allergies  Social History   Socioeconomic History  . Marital status: Single    Spouse name: Not on file  . Number of children: Not on file  . Years of education: Not on file  . Highest education level: Not on file  Occupational History  . Occupation: Emergency planning/management officer: RETIRED  Social Needs  . Financial resource strain: Not on file  . Food insecurity    Worry: Not on file    Inability: Not on file  . Transportation needs    Medical: Not on file    Non-medical: Not on file  Tobacco Use  . Smoking status: Never Smoker  . Smokeless tobacco: Never Used  Substance and Sexual Activity  . Alcohol use: No  . Drug use: No  . Sexual activity: Yes    Birth control/protection: Condom  Lifestyle  . Physical activity    Days per week: Not on file    Minutes per session: Not on file  . Stress: Not on file  Relationships  . Social Herbalist on phone: Not on file    Gets together: Not on file    Attends religious service: Not on file    Active member of club or organization: Not on file    Attends meetings of clubs or organizations: Not on file    Relationship status: Not on file  . Intimate partner violence    Fear of current or ex partner: Not on file    Emotionally abused: Not on file    Physically abused: Not on file    Forced sexual activity: Not on file  Other Topics Concern  . Not on file  Social History Narrative   ** Merged History Encounter **       Single. Education: Other.      Review of Systems: General:  negative for chills, fever, night sweats or weight changes.  Cardiovascular: negative for chest pain, dyspnea on exertion, edema, orthopnea, palpitations, paroxysmal nocturnal dyspnea or shortness of breath Dermatological: negative for rash Respiratory: negative for cough or wheezing Urologic: negative for hematuria Abdominal: negative for nausea, vomiting, diarrhea, bright red blood per rectum, melena, or hematemesis Neurologic: negative for visual changes, syncope, or dizziness All other systems reviewed and are otherwise negative except as noted above.    Blood pressure (!) 159/79, pulse 80,  height 5' 9.5" (1.765 m), weight 254 lb (115.2 kg), SpO2 96 %.  General appearance: alert and no distress Neck: no adenopathy, no carotid bruit, no JVD, supple, symmetrical, trachea midline and thyroid not enlarged, symmetric, no tenderness/mass/nodules Lungs: clear to auscultation bilaterally Heart: regular rate and rhythm, S1, S2 normal, no murmur, click, rub or gallop Extremities: extremities normal, atraumatic, no cyanosis or edema Pulses: 2+ and symmetric Skin: Skin color, texture, turgor normal. No rashes or lesions Neurologic: Alert and oriented X 3, normal strength and tone. Normal symmetric reflexes. Normal coordination and gait  EKG not performed today  ASSESSMENT AND PLAN:   Hyperlipidemia History of hyperlipidemia on statin therapy with lipid profile performed 04/25/2019 revealing a total cholesterol 113, LDL 49 and HDL 36.  Sleep apnea History of obstructive sleep apnea on CPAP.  Renal insufficiency History of mild to moderate renal insufficiency with serum creatinines in the 1.5 range.  Dyspnea on exertion History of dyspnea on exertion of unclear etiology.  I did perform cardiac catheterization on him 06/17/2007 which was entirely normal.  2D echo is abnormal as well.  There is no history of tobacco abuse.  I am going refer him to a pulmonologist for further evaluation.       Lorretta Harp MD FACP,FACC,FAHA, [facility] 08/04/2019 9:40 AM

## 2019-08-04 NOTE — Assessment & Plan Note (Signed)
History of mild to moderate renal insufficiency with serum creatinines in the 1.5 range.

## 2019-08-05 ENCOUNTER — Ambulatory Visit (INDEPENDENT_AMBULATORY_CARE_PROVIDER_SITE_OTHER): Payer: Medicare Other

## 2019-08-05 ENCOUNTER — Ambulatory Visit (INDEPENDENT_AMBULATORY_CARE_PROVIDER_SITE_OTHER): Payer: Medicare Other | Admitting: Pulmonary Disease

## 2019-08-05 ENCOUNTER — Encounter: Payer: Self-pay | Admitting: Pulmonary Disease

## 2019-08-05 VITALS — BP 138/72 | HR 67 | Temp 97.9°F | Ht 69.0 in | Wt 254.8 lb

## 2019-08-05 DIAGNOSIS — R0609 Other forms of dyspnea: Secondary | ICD-10-CM

## 2019-08-05 DIAGNOSIS — K921 Melena: Secondary | ICD-10-CM | POA: Diagnosis not present

## 2019-08-05 DIAGNOSIS — R0600 Dyspnea, unspecified: Secondary | ICD-10-CM

## 2019-08-05 DIAGNOSIS — J9809 Other diseases of bronchus, not elsewhere classified: Secondary | ICD-10-CM | POA: Diagnosis not present

## 2019-08-05 LAB — CBC WITH DIFFERENTIAL/PLATELET
Basophils Absolute: 0 10*3/uL (ref 0.0–0.1)
Basophils Relative: 0.6 % (ref 0.0–3.0)
Eosinophils Absolute: 0.2 10*3/uL (ref 0.0–0.7)
Eosinophils Relative: 4 % (ref 0.0–5.0)
HCT: 44.6 % (ref 39.0–52.0)
Hemoglobin: 15.2 g/dL (ref 13.0–17.0)
Lymphocytes Relative: 18.9 % (ref 12.0–46.0)
Lymphs Abs: 1.1 10*3/uL (ref 0.7–4.0)
MCHC: 34 g/dL (ref 30.0–36.0)
MCV: 91.7 fl (ref 78.0–100.0)
Monocytes Absolute: 0.6 10*3/uL (ref 0.1–1.0)
Monocytes Relative: 10.5 % (ref 3.0–12.0)
Neutro Abs: 3.7 10*3/uL (ref 1.4–7.7)
Neutrophils Relative %: 66 % (ref 43.0–77.0)
Platelets: 156 10*3/uL (ref 150.0–400.0)
RBC: 4.87 Mil/uL (ref 4.22–5.81)
RDW: 14.8 % (ref 11.5–15.5)
WBC: 5.6 10*3/uL (ref 4.0–10.5)

## 2019-08-05 LAB — COMPREHENSIVE METABOLIC PANEL
ALT: 29 U/L (ref 0–53)
AST: 34 U/L (ref 0–37)
Albumin: 4 g/dL (ref 3.5–5.2)
Alkaline Phosphatase: 82 U/L (ref 39–117)
BUN: 17 mg/dL (ref 6–23)
CO2: 27 mEq/L (ref 19–32)
Calcium: 9.1 mg/dL (ref 8.4–10.5)
Chloride: 101 mEq/L (ref 96–112)
Creatinine, Ser: 1.64 mg/dL — ABNORMAL HIGH (ref 0.40–1.50)
GFR: 40.8 mL/min — ABNORMAL LOW (ref >60.00)
Glucose, Bld: 92 mg/dL (ref 70–99)
Potassium: 3.9 mEq/L (ref 3.5–5.1)
Sodium: 138 mEq/L (ref 135–145)
Total Bilirubin: 0.7 mg/dL (ref 0.2–1.2)
Total Protein: 6.8 g/dL (ref 6.0–8.3)

## 2019-08-05 LAB — BRAIN NATRIURETIC PEPTIDE: Pro B Natriuretic peptide (BNP): 42 pg/mL (ref 0.0–100.0)

## 2019-08-05 MED ORDER — BUDESONIDE-FORMOTEROL FUMARATE 160-4.5 MCG/ACT IN AERO: 2.0000 | INHALATION_SPRAY | Freq: Two times a day (BID) | RESPIRATORY_TRACT | 3 refills | Status: DC

## 2019-08-05 MED ORDER — BUDESONIDE-FORMOTEROL FUMARATE 160-4.5 MCG/ACT IN AERO: 2.0000 | INHALATION_SPRAY | Freq: Two times a day (BID) | RESPIRATORY_TRACT | 0 refills | Status: DC

## 2019-08-05 NOTE — Patient Instructions (Addendum)
We will check comprehensive metabolic panel, CBC with differential, IgE, hypersensitivity panel Schedule pulmonary function test, chest x-ray We will start you on a medication called Symbicort 160.  Use 2 puffs twice daily  Continue to use the Nexium daily.  Will refer [facility] to [facility] gastroenterology for further evaluation of the blood you are noticing in your stool Follow-up in 1 month after pulmonary function test.

## 2019-08-05 NOTE — Addendum Note (Signed)
Addended by: Hildred Alamin I on: 08/05/2019 11:19 AM   Modules accepted: Orders

## 2019-08-05 NOTE — Progress Notes (Signed)
[name] AMICONE    MS:[id number]    [birth year]  Primary Care Physician:McNeill, Abigail Butts, MD  Referring Physician: Lorretta Harp, MD [address] [place],  [place] [address]  Chief complaint: Consult for dyspnea  HPI: 78 year old with history of sleep apnea, allergic rhinitis, hyperlipidemia Referred for evaluation of dyspnea.  Symptoms have been going on for the past 2 years.  He has dyspnea on exertion and sometimes at rest.  Associated with wheeze, chest tightness.  Currently on albuterol inhaler.  Previously had tried Advair but cannot tell if it made a difference.  He stopped using it as it was too expensive.  Has seasonal allergies, significant issues with GERD and is on Nexium.  History also noted for colonic polyps.  Previously followed by Dr. Benson Norway Reports seeing blood in the stool recently and intermittent right upper quadrant pain.  Follows with Dr. Gwenlyn Found, cardiology with normal cardiac catheterization in 2008.  Echocardiogram and stress test in 2018 within normal limits.  Symptoms not felt to be related to the heart.  Has bilateral lower extremity edema which is attributed to venous insufficiency.  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  Reports significant basement dampness and flooding at home with mold issues.  No hot tub, Jacuzzi, down pillows, comforters Smoking history: Never smoker Travel history: No significant travel history Relevant family history: No significant family history of lung disease  Outpatient Encounter Medications as of 08/05/2019  Medication Sig  . B-D 3CC LUER-LOK SYR 21GX1-1/2 21G X 1-1/2" 3 ML MISC USE TO INJECT DEPOTESTOSTERONE.  [name] buPROPion (WELLBUTRIN XL) 300 MG 24 hr tablet TAKE 1 TABLET BY MOUTH DAILY *NEEDS OFFICE VISIT  . calcium carbonate 200 MG capsule Take 200 mg by mouth daily.   . cetirizine (ZYRTEC) 10 MG tablet Take 10 mg by  mouth every morning.   . citalopram (CELEXA) 10 MG tablet Take 10 mg by mouth daily.  [name] doxazosin (CARDURA) 4 MG tablet Take 4 mg by mouth daily.  [name] doxazosin (CARDURA) 8 MG tablet TAKE 1 TABLET BY MOUTH DAILY  . Take at night (Patient taking differently: every evening. TAKE 1 TABLET BY MOUTH DAILY  . Take at night)  . esomeprazole (NEXIUM) 20 MG capsule Take 1 capsule (20 mg total) by mouth daily.  [name] etodolac (LODINE) 400 MG tablet Take 1 tablet (400 mg total) by mouth 2 (two) times daily as needed.  . flurazepam (DALMANE) 15 MG capsule Take 1 capsule (15 mg total) by mouth at bedtime as needed for sleep.  . fluticasone (FLONASE) 50 MCG/ACT nasal spray Place 2 sprays into both nostrils daily.  . furosemide (LASIX) 20 MG tablet TAKE 2 TABLETS BY MOUTH ONCE A DAY  . gabapentin (NEURONTIN) 300 MG capsule TAKE 1 TO 2 CAPSULES BY MOUTH AT BEDTIME  . glucosamine-chondroitin 500-400 MG tablet Take 1 tablet by mouth 2 (two) times daily.  [name] HYDROcodone-acetaminophen (NORCO) 5-325 MG tablet 1 po q d prn pain  . ipratropium (ATROVENT) 0.06 % nasal spray   . LORazepam (ATIVAN) 0.5 MG tablet Take 1 tablet as needed for stress on a daily basis. Not to be taken with pain meds  . LORazepam (ATIVAN) 1 MG tablet TAKE 1 TABLET BY MOUTH TWICE DAILY AS NEEDED FOR 30 DAYS  . meloxicam (MOBIC) 15 MG tablet Take 0.5-1 tablets (7.5-15 mg total) by mouth daily as needed for pain.  [name]  meloxicam (MOBIC) 7.5 MG tablet TAKE 1-2 TABLETS BY MOUTH DAILY  . methocarbamol (ROBAXIN) 500 MG tablet Take 1 tablet (500 mg total) by mouth every 6 (six) hours as needed for muscle spasms.  . Multiple Vitamin (MULTIVITAMIN) tablet Take 1 tablet by mouth daily.  [name] NEEDLE, DISP, 21 G 21G X 1-1/2" MISC Use to inject depotestosterone.  . Potassium Chloride ER 20 MEQ TBCR Take 1 tablet by mouth 2 (two) times daily with a meal.  . potassium chloride SA (K-DUR,KLOR-CON) 20 MEQ tablet Take 1 tablet (20 mEq total) by mouth 2 (two) times daily.  [name]  PROAIR HFA 108 (90 Base) MCG/ACT inhaler   . rosuvastatin (CRESTOR) 5 MG tablet Take 5 mg by mouth daily.  . sildenafil (REVATIO) 20 MG tablet Take 5 tablets daily as needed for erectile dysfunction.  . Syringe, Reusable, 3 ML MISC Use to inject depotestosterone.  . testosterone cypionate (DEPOTESTOTERONE CYPIONATE) 100 MG/ML injection Inject 1 mL (100 mg total) into the muscle every 7 (seven) days. For IM use only  . [DISCONTINUED] Fluticasone-Salmeterol (ADVAIR) 250-50 MCG/DOSE AEPB    Facility-Administered Encounter Medications as of 08/05/2019  Medication  . methylPREDNISolone acetate (DEPO-MEDROL) injection 40 mg  . methylPREDNISolone acetate (DEPO-MEDROL) injection 40 mg    Allergies as of 08/05/2019  . (No Known Allergies)    Past Medical History:  Diagnosis Date  . Allergy    takes Zyrtec daily  . Anxiety    takes Ativan daily as needed  . Arthritis   . Back pain   . BPH (benign prostatic hyperplasia)    takes doxazosin for  . 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 Depression    takes Wellbutrin daily  . Dyspnea on exertion    with exertion  . GERD (gastroesophageal reflux disease)    takes Nexium and Omeprazole daily  . History of kidney stones   . 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 Hyperlipidemia   . Neuromuscular disorder ([place])   . Sleep apnea   . Swelling of lower extremity    right more than leg leg  . Umbilical hernia     Past Surgical History:  Procedure Laterality Date  . 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 BACK SURGERY  2003   L4, L5  . BACK SURGERY    . ENDOVENOUS ABLATION SAPHENOUS VEIN W/ LASER Right 06-01-2014   EVLA right greater saphenous vein by Curt Jews MD   . epidural steroid injections        [facility] dr [place]ton  . hernia repair  2003  . HERNIA REPAIR     hernia inguinal x 2  . 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 UMBILICAL HERNIA REPAIR N/A 06/27/2016   Procedure: LAPAROSCOPIC ASSISTED REPAIR OF UMBILICAL HERINA WITH MESH;  Surgeon: Johnathan Hausen, MD;  Location: [facility];  Service: General;  Laterality: N/A;  . VASCULAR SURGERY  2015   right leg    Family History  Problem Relation Age of Onset  . Thyroid disease Mother   . Heart disease Father   . Diabetes Maternal Grandfather     Social History   Socioeconomic History  . Marital status: Single    Spouse name: Not on file  . Number of children: Not on file  .  Years of education: Not on file  . Highest education level: Not on file  Occupational History  . Occupation: Emergency planning/management officer: RETIRED  Social Needs  . Financial resource strain: Not on file  . Food insecurity    Worry: Not on file    Inability: Not on file  . Transportation needs    Medical: Not on file    Non-medical: Not on file  Tobacco Use  . Smoking status: Never Smoker  . Smokeless tobacco: Never Used  Substance and Sexual Activity  . Alcohol use: No  . Drug use: No  . Sexual activity: Yes    Birth control/protection: Condom  Lifestyle  . Physical activity    Days per week: Not on file    Minutes per session: Not on file  . Stress: Not on file  Relationships  . Social Herbalist on phone: Not on file    Gets together: Not on file    Attends religious service: Not on file    Active member of club or organization: Not on file    Attends meetings of clubs or organizations: Not on file    Relationship status: Not on file  . Intimate partner violence    Fear of current or ex partner: Not on file    Emotionally abused: Not on file    Physically abused: Not on file    Forced sexual activity: Not on file  Other Topics Concern  . Not on file  Social History Narrative   ** Merged History Encounter **       Single. Education: Other.     Review of systems: Review  of Systems  Constitutional: Negative for fever and chills.  HENT: Negative.   Eyes: Negative for blurred vision.  Respiratory: as per HPI  Cardiovascular: Negative for chest pain and palpitations.  Gastrointestinal: Negative for vomiting, diarrhea, blood per rectum. Genitourinary: Negative for dysuria, urgency, frequency and hematuria.  Musculoskeletal: Negative for myalgias, back pain and joint pain.  Skin: Negative for itching and rash.  Neurological: Negative for dizziness, tremors, focal weakness, seizures and loss of consciousness.  Endo/Heme/Allergies: Negative for environmental allergies.  Psychiatric/Behavioral: Negative for depression, suicidal ideas and hallucinations.  All other systems reviewed and are negative.  Physical Exam: Blood pressure 138/72, pulse 67, temperature 97.9 F (36.6 C), temperature source Temporal, height 5\' 9"  (1.753 m), weight 254 lb 12.8 oz (115.6 kg), SpO2 96 %. Gen:      No acute distress, obese HEENT:  EOMI, sclera anicteric Neck:     No masses; no thyromegaly Lungs:    Clear to auscultation bilaterally; normal respiratory effort CV:         Regular rate and rhythm; no murmurs Abd:      + bowel sounds; soft, non-tender; no palpable masses, no distension Ext:    1-2+ edema; adequate peripheral perfusion Skin:      Warm and dry; no rash Neuro: alert and oriented x 3 Psych: normal mood and affect  Data Reviewed: Imaging: CTA 08/13/2018 no pulmonary embolism.  Bronchial wall thickening.  Dependent atelectasis. I have reviewed the images personally.  Cardiac: Echocardiogram 02/18/2017- LVEF 0000000, grade 1 diastolic dysfunction  Nuclear stress test 02/19/2017-mild diffuse hypokinesis, EF 45%.  Low risk study with no ischemia identified.  Sleep PSG 12/01/2018- severe sleep apnea with AHI 90.6 desaturations to 76%. CPAP titration/2/20- recommend trial CPAP on 11  Assessment:  Evaluation for dyspnea Suspect reactive airway disease, asthma.  Has  significant mold exposure at home Schedule chest x-ray, pulmonary function test Check CBC, IgE, hypersensitivity panel Previous CT noted for dependent atelectasis.  Consider high-resolution CT for evaluation of interstitial lung disease if there is evidence of restriction on pulmonary function test  Start Symbicort 160  GERD, blood in stool Check hemoglobin on CBC and LFTs as he has intermittent right upper quadrant pain Needs follow-up with GI to evaluate peptic ulcer disease and colon polyps He used to see Dr. Benson Norway but prefers referral to a different specialist  Continue Nexium  Severe Obstructive sleep apnea On CPAP Review download  Plan/Recommendations: - Symbicort 160 - Chest x-ray, PFTs - CBC, IgE, HP panel - GI referral - CPAP download  Marshell Garfinkel MD [facility] 08/05/2019, 9:56 AM  CC: Lorretta Harp, MD

## 2019-08-05 NOTE — Addendum Note (Signed)
Addended by: Suzzanne Cloud E on: 08/05/2019 10:53 AM   Modules accepted: Orders

## 2019-08-08 LAB — IGE: IgE (Immunoglobulin E), Serum: 89 kU/L (ref <=114)

## 2019-08-09 ENCOUNTER — Telehealth: Payer: Self-pay

## 2019-08-09 DIAGNOSIS — M50122 Cervical disc disorder at C5-C6 level with radiculopathy: Secondary | ICD-10-CM | POA: Diagnosis not present

## 2019-08-09 DIAGNOSIS — M48062 Spinal stenosis, lumbar region with neurogenic claudication: Secondary | ICD-10-CM | POA: Diagnosis not present

## 2019-08-09 DIAGNOSIS — M1711 Unilateral primary osteoarthritis, right knee: Secondary | ICD-10-CM | POA: Diagnosis not present

## 2019-08-09 DIAGNOSIS — R300 Dysuria: Secondary | ICD-10-CM | POA: Diagnosis not present

## 2019-08-09 DIAGNOSIS — M25511 Pain in right shoulder: Secondary | ICD-10-CM | POA: Diagnosis not present

## 2019-08-09 DIAGNOSIS — N472 Paraphimosis: Secondary | ICD-10-CM | POA: Diagnosis not present

## 2019-08-09 LAB — HYPERSENSITIVITY PNEUMONITIS
A. Pullulans Abs: NEGATIVE
A.Fumigatus #1 Abs: NEGATIVE
Micropolyspora faeni, IgG: NEGATIVE
Pigeon Serum Abs: NEGATIVE
Thermoact. Saccharii: NEGATIVE
Thermoactinomyces vulgaris, IgG: NEGATIVE

## 2019-08-09 NOTE — Telephone Encounter (Signed)
ROI fax to Dr. Benson Norway for patient records

## 2019-08-10 ENCOUNTER — Other Ambulatory Visit: Payer: Self-pay

## 2019-08-10 ENCOUNTER — Ambulatory Visit (HOSPITAL_COMMUNITY)
Admission: RE | Admit: 2019-08-10 | Discharge: 2019-08-10 | Disposition: A | Discharge status: Home / Self Care | Payer: Medicare Other | Source: Ambulatory Visit | Attending: Cardiovascular Disease | Admitting: Cardiovascular Disease

## 2019-08-10 ENCOUNTER — Other Ambulatory Visit (HOSPITAL_COMMUNITY): Payer: Medicare Other

## 2019-08-10 DIAGNOSIS — I088 Other rheumatic multiple valve diseases: Secondary | ICD-10-CM | POA: Insufficient documentation

## 2019-08-10 DIAGNOSIS — R0600 Dyspnea, unspecified: Secondary | ICD-10-CM | POA: Diagnosis not present

## 2019-08-10 NOTE — Progress Notes (Signed)
  Echocardiogram 2D Echocardiogram has been performed.  [name] 08/10/2019, 11:27 AM

## 2019-08-11 DIAGNOSIS — M48062 Spinal stenosis, lumbar region with neurogenic claudication: Secondary | ICD-10-CM | POA: Diagnosis not present

## 2019-08-11 DIAGNOSIS — M25511 Pain in right shoulder: Secondary | ICD-10-CM | POA: Diagnosis not present

## 2019-08-11 DIAGNOSIS — M1711 Unilateral primary osteoarthritis, right knee: Secondary | ICD-10-CM | POA: Diagnosis not present

## 2019-08-11 DIAGNOSIS — M50122 Cervical disc disorder at C5-C6 level with radiculopathy: Secondary | ICD-10-CM | POA: Diagnosis not present

## 2019-08-12 DIAGNOSIS — R1011 Right upper quadrant pain: Secondary | ICD-10-CM | POA: Diagnosis not present

## 2019-08-12 DIAGNOSIS — R222 Localized swelling, mass and lump, trunk: Secondary | ICD-10-CM | POA: Diagnosis not present

## 2019-08-15 ENCOUNTER — Other Ambulatory Visit: Payer: Self-pay | Admitting: Surgery

## 2019-08-15 DIAGNOSIS — R222 Localized swelling, mass and lump, trunk: Secondary | ICD-10-CM

## 2019-08-16 ENCOUNTER — Ambulatory Visit
Admission: RE | Admit: 2019-08-16 | Discharge: 2019-08-16 | Disposition: A | Discharge status: Home / Self Care | Payer: Medicare Other | Source: Ambulatory Visit | Attending: Surgery | Admitting: Surgery

## 2019-08-16 DIAGNOSIS — R222 Localized swelling, mass and lump, trunk: Secondary | ICD-10-CM

## 2019-08-16 MED ORDER — IOPAMIDOL (ISOVUE-300) INJECTION 61%
50.0000 mL | Freq: Once | INTRAVENOUS | Status: AC | PRN
  Administered 2019-08-16: 50 mL via INTRAVENOUS

## 2019-08-18 DIAGNOSIS — M48062 Spinal stenosis, lumbar region with neurogenic claudication: Secondary | ICD-10-CM | POA: Diagnosis not present

## 2019-08-18 DIAGNOSIS — M50122 Cervical disc disorder at C5-C6 level with radiculopathy: Secondary | ICD-10-CM | POA: Diagnosis not present

## 2019-08-18 DIAGNOSIS — M25511 Pain in right shoulder: Secondary | ICD-10-CM | POA: Diagnosis not present

## 2019-08-18 DIAGNOSIS — M1711 Unilateral primary osteoarthritis, right knee: Secondary | ICD-10-CM | POA: Diagnosis not present

## 2019-08-19 ENCOUNTER — Telehealth: Payer: Self-pay

## 2019-08-19 DIAGNOSIS — R222 Localized swelling, mass and lump, trunk: Secondary | ICD-10-CM | POA: Diagnosis not present

## 2019-08-19 NOTE — Telephone Encounter (Signed)
   [facility] Pre-operative Risk Assessment    Request for surgical clearance:  1. What type of surgery is being performed?  EXCISION OF RIGHT CHEST WALL MASS    2. When is this surgery scheduled?  TBD   3. What type of clearance is required (medical clearance vs. Pharmacy clearance to hold med vs. Both)?  MEDICAL  4. Are there any medications that need to be held prior to surgery and how long? NONE LISTED   5. Practice name and name of physician performing surgery? [facility] ATTN:[facility]MIRA    6. What is your office phone number [phone]    7.   What is your office fax number [phone]  8.   Anesthesia type (None, local, MAC, general) ? GENERAL

## 2019-08-23 DIAGNOSIS — M1711 Unilateral primary osteoarthritis, right knee: Secondary | ICD-10-CM | POA: Diagnosis not present

## 2019-08-23 DIAGNOSIS — M48062 Spinal stenosis, lumbar region with neurogenic claudication: Secondary | ICD-10-CM | POA: Diagnosis not present

## 2019-08-23 DIAGNOSIS — M50122 Cervical disc disorder at C5-C6 level with radiculopathy: Secondary | ICD-10-CM | POA: Diagnosis not present

## 2019-08-23 DIAGNOSIS — M25511 Pain in right shoulder: Secondary | ICD-10-CM | POA: Diagnosis not present

## 2019-08-23 NOTE — Telephone Encounter (Signed)
Left message for the patient to call back and speak to the preop APP. 

## 2019-08-23 NOTE — Telephone Encounter (Signed)
Confirmed with surgery scheduler, surgery is on the outside of rib cage, anterior to the right 10th rib.

## 2019-08-25 NOTE — Telephone Encounter (Signed)
Patient called back, the connection was very poor and was unable to hear anything besides static.  Rosaria Ferries, PA-C 08/25/2019 1:41 PM [facility] [phone]

## 2019-08-29 NOTE — Telephone Encounter (Signed)
He had a neg MV and nl 2D echo 2 years ago. Cleared at low risk  JJB

## 2019-08-29 NOTE — Telephone Encounter (Signed)
   Primary Cardiologist: Quay Burow, MD  [name] 78 year old male with a past medical history of venous insufficiency, sleep apnea, hypogonadism, lumbar radiculopathy, ulcer of lower limb, degenerative arthritis of knee, basal cell carcinoma, Achilles tendon contracture bilateral, renal insufficiency, hyperlipidemia, and dyspnea on exertion. Underwent myocardial perfusion stress test on 02/19/2017 which showed EF 45%, mild diffuse hypokinesis, LVEF 45 to 54%, low risk study.  Cardiac catheterization on 06/17/2007 was normal.  Chart reviewed as part of pre-operative protocol coverage. Given past medical history and time since last visit, based on ACC/AHA guidelines, JANIEL RUNKEL would be at acceptable risk for the planned procedure without further cardiovascular testing.   His RCRI is a class II with 0.9% risk of a major cardiac event.  He is currently not taking any blood thinning agent or aspirin at this time.  I will route this recommendation to the requesting party via Epic fax function and remove from pre-op pool.  Please call with questions.  Jossie Ng. Neila Teem NP-C  08/29/2019, 3:02 PM

## 2019-09-01 DIAGNOSIS — M25511 Pain in right shoulder: Secondary | ICD-10-CM | POA: Diagnosis not present

## 2019-09-01 DIAGNOSIS — M48062 Spinal stenosis, lumbar region with neurogenic claudication: Secondary | ICD-10-CM | POA: Diagnosis not present

## 2019-09-01 DIAGNOSIS — M50122 Cervical disc disorder at C5-C6 level with radiculopathy: Secondary | ICD-10-CM | POA: Diagnosis not present

## 2019-09-01 DIAGNOSIS — M1711 Unilateral primary osteoarthritis, right knee: Secondary | ICD-10-CM | POA: Diagnosis not present

## 2019-09-06 ENCOUNTER — Other Ambulatory Visit: Payer: Self-pay

## 2019-09-06 DIAGNOSIS — M25511 Pain in right shoulder: Secondary | ICD-10-CM | POA: Diagnosis not present

## 2019-09-06 DIAGNOSIS — M50122 Cervical disc disorder at C5-C6 level with radiculopathy: Secondary | ICD-10-CM | POA: Diagnosis not present

## 2019-09-06 DIAGNOSIS — M79642 Pain in left hand: Secondary | ICD-10-CM | POA: Diagnosis not present

## 2019-09-06 DIAGNOSIS — M1711 Unilateral primary osteoarthritis, right knee: Secondary | ICD-10-CM | POA: Diagnosis not present

## 2019-09-06 DIAGNOSIS — Z20822 Contact with and (suspected) exposure to covid-19: Secondary | ICD-10-CM

## 2019-09-06 DIAGNOSIS — E291 Testicular hypofunction: Secondary | ICD-10-CM | POA: Diagnosis not present

## 2019-09-06 DIAGNOSIS — M48062 Spinal stenosis, lumbar region with neurogenic claudication: Secondary | ICD-10-CM | POA: Diagnosis not present

## 2019-09-07 LAB — NOVEL CORONAVIRUS, NAA: SARS-CoV-2, NAA: NOT DETECTED

## 2019-09-14 ENCOUNTER — Telehealth: Payer: Self-pay | Admitting: Physical Medicine and Rehabilitation

## 2019-09-14 NOTE — Telephone Encounter (Signed)
ok 

## 2019-09-15 DIAGNOSIS — M1711 Unilateral primary osteoarthritis, right knee: Secondary | ICD-10-CM | POA: Diagnosis not present

## 2019-09-15 DIAGNOSIS — R0600 Dyspnea, unspecified: Secondary | ICD-10-CM | POA: Diagnosis not present

## 2019-09-15 DIAGNOSIS — K219 Gastro-esophageal reflux disease without esophagitis: Secondary | ICD-10-CM | POA: Diagnosis not present

## 2019-09-15 DIAGNOSIS — G479 Sleep disorder, unspecified: Secondary | ICD-10-CM | POA: Diagnosis not present

## 2019-09-15 DIAGNOSIS — F411 Generalized anxiety disorder: Secondary | ICD-10-CM | POA: Diagnosis not present

## 2019-09-15 DIAGNOSIS — M48062 Spinal stenosis, lumbar region with neurogenic claudication: Secondary | ICD-10-CM | POA: Diagnosis not present

## 2019-09-15 DIAGNOSIS — M50122 Cervical disc disorder at C5-C6 level with radiculopathy: Secondary | ICD-10-CM | POA: Diagnosis not present

## 2019-09-15 DIAGNOSIS — M25511 Pain in right shoulder: Secondary | ICD-10-CM | POA: Diagnosis not present

## 2019-09-15 NOTE — Telephone Encounter (Signed)
Left message #1

## 2019-09-16 ENCOUNTER — Other Ambulatory Visit: Payer: Self-pay

## 2019-09-16 ENCOUNTER — Ambulatory Visit
Admission: RE | Admit: 2019-09-16 | Discharge: 2019-09-16 | Disposition: A | Discharge status: Home / Self Care | Payer: Medicare Other | Source: Ambulatory Visit | Attending: Family Medicine | Admitting: Family Medicine

## 2019-09-16 DIAGNOSIS — Z13820 Encounter for screening for osteoporosis: Secondary | ICD-10-CM | POA: Diagnosis not present

## 2019-09-16 DIAGNOSIS — M84376G Stress fracture, unspecified foot, subsequent encounter for fracture with delayed healing: Secondary | ICD-10-CM

## 2019-09-16 DIAGNOSIS — M85871 Other specified disorders of bone density and structure, right ankle and foot: Secondary | ICD-10-CM

## 2019-09-16 NOTE — Telephone Encounter (Signed)
Patient called back. Returned his call and left message #2.

## 2019-09-19 NOTE — Telephone Encounter (Signed)
Scheduled for 11/25 at 1400 with driver.

## 2019-09-27 ENCOUNTER — Telehealth: Payer: Self-pay | Admitting: Pulmonary Disease

## 2019-09-27 DIAGNOSIS — H02831 Dermatochalasis of right upper eyelid: Secondary | ICD-10-CM | POA: Diagnosis not present

## 2019-09-27 DIAGNOSIS — H02834 Dermatochalasis of left upper eyelid: Secondary | ICD-10-CM | POA: Diagnosis not present

## 2019-09-27 DIAGNOSIS — H35373 Puckering of macula, bilateral: Secondary | ICD-10-CM | POA: Diagnosis not present

## 2019-09-27 DIAGNOSIS — H35342 Macular cyst, hole, or pseudohole, left eye: Secondary | ICD-10-CM | POA: Diagnosis not present

## 2019-09-27 DIAGNOSIS — H0288B Meibomian gland dysfunction left eye, upper and lower eyelids: Secondary | ICD-10-CM | POA: Diagnosis not present

## 2019-09-27 DIAGNOSIS — Z961 Presence of intraocular lens: Secondary | ICD-10-CM | POA: Diagnosis not present

## 2019-09-27 DIAGNOSIS — H04123 Dry eye syndrome of bilateral lacrimal glands: Secondary | ICD-10-CM | POA: Diagnosis not present

## 2019-09-27 DIAGNOSIS — H0288A Meibomian gland dysfunction right eye, upper and lower eyelids: Secondary | ICD-10-CM | POA: Diagnosis not present

## 2019-09-27 NOTE — Telephone Encounter (Signed)
Printed & placed in folder to be scheduled.  

## 2019-09-29 DIAGNOSIS — R1011 Right upper quadrant pain: Secondary | ICD-10-CM | POA: Diagnosis not present

## 2019-09-29 DIAGNOSIS — R140 Abdominal distension (gaseous): Secondary | ICD-10-CM | POA: Diagnosis not present

## 2019-09-29 DIAGNOSIS — R0602 Shortness of breath: Secondary | ICD-10-CM | POA: Diagnosis not present

## 2019-09-29 DIAGNOSIS — K219 Gastro-esophageal reflux disease without esophagitis: Secondary | ICD-10-CM | POA: Diagnosis not present

## 2019-10-04 DIAGNOSIS — M19031 Primary osteoarthritis, right wrist: Secondary | ICD-10-CM | POA: Diagnosis not present

## 2019-10-04 DIAGNOSIS — G5602 Carpal tunnel syndrome, left upper limb: Secondary | ICD-10-CM | POA: Diagnosis not present

## 2019-10-04 DIAGNOSIS — M25551 Pain in right hip: Secondary | ICD-10-CM | POA: Diagnosis not present

## 2019-10-04 DIAGNOSIS — G5601 Carpal tunnel syndrome, right upper limb: Secondary | ICD-10-CM | POA: Diagnosis not present

## 2019-10-05 ENCOUNTER — Ambulatory Visit (INDEPENDENT_AMBULATORY_CARE_PROVIDER_SITE_OTHER): Payer: Medicare Other | Admitting: Physical Medicine and Rehabilitation

## 2019-10-05 ENCOUNTER — Ambulatory Visit: Payer: Self-pay

## 2019-10-05 ENCOUNTER — Encounter: Payer: Self-pay | Admitting: Physical Medicine and Rehabilitation

## 2019-10-05 ENCOUNTER — Other Ambulatory Visit: Payer: Self-pay

## 2019-10-05 VITALS — BP 141/83 | HR 96

## 2019-10-05 DIAGNOSIS — M48062 Spinal stenosis, lumbar region with neurogenic claudication: Secondary | ICD-10-CM

## 2019-10-05 DIAGNOSIS — R202 Paresthesia of skin: Secondary | ICD-10-CM | POA: Diagnosis not present

## 2019-10-05 DIAGNOSIS — M21371 Foot drop, right foot: Secondary | ICD-10-CM

## 2019-10-05 DIAGNOSIS — M5416 Radiculopathy, lumbar region: Secondary | ICD-10-CM | POA: Diagnosis not present

## 2019-10-05 NOTE — Progress Notes (Signed)
Pt states pain in the lower back that radiates into the right leg all the way down. Pt also numbness in right leg and right foot. Pt states pain started over a month ago. Pt states an increase in activities makes pain worse. Hydrocodone helps with pain when its really bad.   .Numeric Pain Rating Scale and Functional Assessment Average Pain 7   In the last MONTH (on 0-10 scale) has pain interfered with the following?  1. General activity like being  able to carry out your everyday physical activities such as walking, climbing stairs, carrying groceries, or moving a chair?  Rating(8)   +Driver, -BT, -Dye Allergies.

## 2019-10-12 ENCOUNTER — Telehealth: Payer: Self-pay | Admitting: Physical Medicine and Rehabilitation

## 2019-10-12 DIAGNOSIS — M4316 Spondylolisthesis, lumbar region: Secondary | ICD-10-CM

## 2019-10-12 DIAGNOSIS — M48062 Spinal stenosis, lumbar region with neurogenic claudication: Secondary | ICD-10-CM

## 2019-10-12 DIAGNOSIS — M5416 Radiculopathy, lumbar region: Secondary | ICD-10-CM

## 2019-10-13 ENCOUNTER — Encounter: Payer: Self-pay | Admitting: Pulmonary Disease

## 2019-10-13 ENCOUNTER — Ambulatory Visit (INDEPENDENT_AMBULATORY_CARE_PROVIDER_SITE_OTHER): Payer: Medicare Other | Admitting: Pulmonary Disease

## 2019-10-13 ENCOUNTER — Other Ambulatory Visit: Payer: Self-pay

## 2019-10-13 DIAGNOSIS — R0602 Shortness of breath: Secondary | ICD-10-CM

## 2019-10-13 LAB — BRAIN NATRIURETIC PEPTIDE: Pro B Natriuretic peptide (BNP): 19 pg/mL (ref 0.0–100.0)

## 2019-10-13 NOTE — Progress Notes (Signed)
[name]    OP:[id number]    June 17, 1941  Primary Care Physician:McNeill, Abigail Butts, MD  Referring Physician: Cari Caraway, MD [name],  [place] [id number]  Chief complaint: Follow up for dyspnea  HPI: 78 year old with history of sleep apnea, allergic rhinitis, hyperlipidemia Referred for evaluation of dyspnea.  Symptoms have been going on for the past 2 years.  He has dyspnea on exertion and sometimes at rest.  Associated with wheeze, chest tightness.  Currently on albuterol inhaler.  Previously had tried Advair but cannot tell if it made a difference.  He stopped using it as it was too expensive.  Has seasonal allergies, significant issues with GERD and is on Nexium.  History also noted for colonic polyps.  Previously followed by Dr. Benson Norway Reports seeing blood in the stool recently and intermittent right upper quadrant pain.  Follows with Dr. Gwenlyn Found, cardiology with normal cardiac catheterization in 2008.  Echocardiogram and stress test in 2018 within normal limits.  Symptoms not felt to be related to the heart.  Has bilateral lower extremity edema which is attributed to venous insufficiency.  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  Reports significant basement dampness and flooding at home with mold issues.  No hot tub, Jacuzzi, down pillows, comforters Smoking history: Never smoker Travel history: No significant travel history Relevant family history: No significant family history of lung disease  Interim history: Started on Symbicort inhaler.  Cannot tell if it is making a difference.  He is using it only once daily. Continues to have significant dyspnea on exertion  Outpatient Encounter Medications as of 10/13/2019  Medication Sig  . B-D 3CC LUER-LOK SYR 21GX1-1/2 21G X 1-1/2" 3 ML MISC USE TO INJECT DEPOTESTOSTERONE.  . budesonide-formoterol (SYMBICORT) 160-4.5  MCG/ACT inhaler Inhale 2 puffs into the lungs 2 (two) times daily.  . budesonide-formoterol (SYMBICORT) 160-4.5 MCG/ACT inhaler Inhale 2 puffs into the lungs 2 (two) times daily.  [name] buPROPion (WELLBUTRIN XL) 300 MG 24 hr tablet TAKE 1 TABLET BY MOUTH DAILY *NEEDS OFFICE VISIT  . calcium carbonate 200 MG capsule Take 200 mg by mouth daily.   . cetirizine (ZYRTEC) 10 MG tablet Take 10 mg by mouth every morning.   . citalopram (CELEXA) 10 MG tablet Take 10 mg by mouth daily.  [name] doxazosin (CARDURA) 4 MG tablet Take 4 mg by mouth daily.  [name] doxazosin (CARDURA) 8 MG tablet TAKE 1 TABLET BY MOUTH DAILY  . Take at night (Patient taking differently: every evening. TAKE 1 TABLET BY MOUTH DAILY  . Take at night)  . esomeprazole (NEXIUM) 20 MG capsule Take 1 capsule (20 mg total) by mouth daily.  [name] etodolac (LODINE) 400 MG tablet Take 1 tablet (400 mg total) by mouth 2 (two) times daily as needed.  . flurazepam (DALMANE) 15 MG capsule Take 1 capsule (15 mg total) by mouth at bedtime as needed for sleep.  . fluticasone (FLONASE) 50 MCG/ACT nasal spray Place 2 sprays into both nostrils daily.  . furosemide (LASIX) 20 MG tablet TAKE 2 TABLETS BY MOUTH ONCE A DAY  . gabapentin (NEURONTIN) 300 MG capsule TAKE 1 TO 2 CAPSULES BY MOUTH AT BEDTIME  . glucosamine-chondroitin 500-400 MG tablet Take 1 tablet by mouth 2 (two) times daily.  [name] HYDROcodone-acetaminophen (NORCO) 5-325 MG tablet 1 po q d prn pain  . ipratropium (ATROVENT) 0.06 % nasal spray   .  LORazepam (ATIVAN) 0.5 MG tablet Take 1 tablet as needed for stress on a daily basis. Not to be taken with pain meds  . LORazepam (ATIVAN) 1 MG tablet TAKE 1 TABLET BY MOUTH TWICE DAILY AS NEEDED FOR 30 DAYS  . meloxicam (MOBIC) 15 MG tablet Take 0.5-1 tablets (7.5-15 mg total) by mouth daily as needed for pain.  . meloxicam (MOBIC) 7.5 MG tablet TAKE 1-2 TABLETS BY MOUTH DAILY  . methocarbamol (ROBAXIN) 500 MG tablet Take 1 tablet (500 mg total) by mouth every 6  (six) hours as needed for muscle spasms.  . Multiple Vitamin (MULTIVITAMIN) tablet Take 1 tablet by mouth daily.  [name] NEEDLE, DISP, 21 G 21G X 1-1/2" MISC Use to inject depotestosterone.  . Potassium Chloride ER 20 MEQ TBCR Take 1 tablet by mouth 2 (two) times daily with a meal.  . potassium chloride SA (K-DUR,KLOR-CON) 20 MEQ tablet Take 1 tablet (20 mEq total) by mouth 2 (two) times daily.  [name] PROAIR HFA 108 (90 Base) MCG/ACT inhaler   . rosuvastatin (CRESTOR) 5 MG tablet Take 5 mg by mouth daily.  . sildenafil (REVATIO) 20 MG tablet Take 5 tablets daily as needed for erectile dysfunction.  . Syringe, Reusable, 3 ML MISC Use to inject depotestosterone.  . testosterone cypionate (DEPOTESTOTERONE CYPIONATE) 100 MG/ML injection Inject 1 mL (100 mg total) into the muscle every 7 (seven) days. For IM use only   No facility-administered encounter medications on file as of 10/13/2019.     Allergies as of 10/13/2019  . (No Known Allergies)   Physical Exam: Blood pressure 138/72, pulse 67, temperature 97.9 F (36.6 C), temperature source Temporal, height 5\' 9"  (1.753 m), weight 254 lb 12.8 oz (115.6 kg), SpO2 96 %. Gen:      No acute distress, obese HEENT:  EOMI, sclera anicteric Neck:     No masses; no thyromegaly Lungs:    Clear to auscultation bilaterally; normal respiratory effort CV:         Regular rate and rhythm; no murmurs Abd:      + bowel sounds; soft, non-tender; no palpable masses, no distension Ext:    1-2+ edema; adequate peripheral perfusion Skin:      Warm and dry; no rash Neuro: alert and oriented x 3 Psych: normal mood and affect  Data Reviewed: Imaging: CTA 08/13/2018 no pulmonary embolism.  Bronchial wall thickening.  Dependent atelectasis.  CT chest 08/16/2019-mild dependent atelectasis, No evidence of interstitial lung disease. I have reviewed the images personally.  Cardiac: Echocardiogram 02/18/2017- LVEF 0000000, grade 1 diastolic dysfunction  Nuclear stress test  02/19/2017-mild diffuse hypokinesis, EF 45%.  Low risk study with no ischemia identified.   Labs: CMP 08/05/2019-significant for creatinine of 1.64, hepatic panel within normal limits CBC 08/05/2019-WBC 5.6, eos 4, absolute eosinophil count 224 IgE 08/05/2019-89 Hypersensitivity panel 08/05/2019-negative BNP 08/05/2019-42  SARS-CoV-2 09/06/2019-negative  Sleep PSG 12/01/2018- severe sleep apnea with AHI 90.6 desaturations to 76%. CPAP titration/2/20- recommend trial CPAP on 11  Assessment:  Evaluation for dyspnea Suspect reactive airway disease, asthma.  Has significant mold exposure at home Check mold antibody panel, basic CTD serologies, D-dimer and BNP Advised him to start using the Symbicort twice daily for maximal effect PFTs scheduled for January  He has had multiple CTs over the years noted for dependent atelectasis.  We will get a high-res CT for better evaluation  GERD, blood in stool, Intermittent right upper quadrant pain Hemoglobin has been stable.  Follows with Dr. Paulita Fujita, GI Continue Nexium  Severe Obstructive sleep apnea On CPAP Review download  Plan/Recommendations: - Symbicort twice daily - ANA, CCP, rheumatoid factor, mold antibody panel, D-dimer, BNP - High-res CT - CPAP download  Marshell Garfinkel MD [facility] 10/13/2019, 11:37 AM  CC: Cari Caraway, MD

## 2019-10-13 NOTE — Patient Instructions (Signed)
We will schedule you for high-res CT for better evaluation of your lungs Start using the Symbicort 2 puffs twice daily for maximal effect We will check some basic labs including ANA, rheumatoid factor, CCP, mold antibody panel, D-dimer, BNP Follow-up in 1 month after pulmonary function test

## 2019-10-14 ENCOUNTER — Telehealth: Payer: Self-pay

## 2019-10-14 ENCOUNTER — Other Ambulatory Visit: Payer: Self-pay

## 2019-10-14 DIAGNOSIS — R7989 Other specified abnormal findings of blood chemistry: Secondary | ICD-10-CM

## 2019-10-14 DIAGNOSIS — Z20822 Contact with and (suspected) exposure to covid-19: Secondary | ICD-10-CM

## 2019-10-14 LAB — ALLERGY PANEL 11, MOLD GROUP
Allergen, A. alternata, m6: <0.1 kU/L
Allergen, Mucor Racemosus, M4: <0.1 kU/L
Aspergillus fumigatus, m3: <0.1 kU/L
CLADOSPORIUM HERBARUM (M2) IGE: <0.1 kU/L
CLASS: 0
CLASS: 0
Candida Albicans: <0.1 kU/L
Class: 0
Class: 0
Class: 0

## 2019-10-14 LAB — RHEUMATOID FACTOR: Rheumatoid fact SerPl-aCnc: <14 IU/mL (ref <14)

## 2019-10-14 LAB — D-DIMER, QUANTITATIVE: D-Dimer, Quant: 0.84 mcg/mL FEU — ABNORMAL HIGH (ref <0.50)

## 2019-10-14 LAB — CYCLIC CITRUL PEPTIDE ANTIBODY, IGG: Cyclic Citrullin Peptide Ab: <16 UNITS

## 2019-10-14 LAB — ANA: Anti Nuclear Antibody (ANA): NEGATIVE

## 2019-10-14 LAB — INTERPRETATION:

## 2019-10-14 NOTE — Telephone Encounter (Signed)
I called and spoke with the patient and made him aware of the lab results and the change in his CT scan. He verbalized understanding and  a CTA of the chest has been ordered to change current order.

## 2019-10-14 NOTE — Telephone Encounter (Signed)
-----   Message from Marshell Garfinkel, MD sent at 10/14/2019  2:01 PM EST ----- D-dimer is elevated. Change high-res CT to CT angiogram to evaluate for PE

## 2019-10-15 LAB — NOVEL CORONAVIRUS, NAA: SARS-CoV-2, NAA: NOT DETECTED

## 2019-10-17 ENCOUNTER — Telehealth: Payer: Self-pay | Admitting: Pulmonary Disease

## 2019-10-17 ENCOUNTER — Telehealth: Payer: Self-pay | Admitting: Radiology

## 2019-10-17 DIAGNOSIS — R0602 Shortness of breath: Secondary | ICD-10-CM

## 2019-10-17 NOTE — Telephone Encounter (Signed)
Advised pt [name] has the order and would be giving him a call but he could call them too to expedite the apt.

## 2019-10-17 NOTE — Telephone Encounter (Signed)
CT is tomorrow.[name]

## 2019-10-17 NOTE — Telephone Encounter (Signed)
Yes please inform the patient that with numbness and weakness of that right leg shot is not going to help the situation and that he really probably does need to look at surgery for this or he could likely have permanent nerve damage.  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  He would be wise to either try to see Dr. Louanne Skye or a neurosurgeon.  He does not have to get the AFO although it would make him less likely to trip and fall.

## 2019-10-17 NOTE — Telephone Encounter (Signed)
Patient is calling to check the status of his MRI--he says that he hasn't heard anything regarding it.  Thanks

## 2019-10-17 NOTE — Telephone Encounter (Signed)
Spoke with [place]. She stated that the patient is scheduled for a CT scan tomorrow. Before the CT can be done, the patient will need to have a I-stat creatinine order placed. I advised her that I would check with Dr. Vaughan Browner first. She has requested to have this order printed and faxed over to her at [phone].   Spoke with Dr. Vaughan Browner. He stated that he was ok with the order.   Will go ahead and place the order. I will also fax it to her. Nothing further needed at time of call.

## 2019-10-18 ENCOUNTER — Other Ambulatory Visit: Payer: Self-pay

## 2019-10-18 ENCOUNTER — Ambulatory Visit (HOSPITAL_COMMUNITY)
Admission: RE | Admit: 2019-10-18 | Discharge: 2019-10-18 | Disposition: A | Discharge status: Home / Self Care | Payer: Medicare Other | Source: Ambulatory Visit | Attending: Pulmonary Disease | Admitting: Pulmonary Disease

## 2019-10-18 ENCOUNTER — Ambulatory Visit
Admission: RE | Admit: 2019-10-18 | Discharge: 2019-10-18 | Disposition: A | Discharge status: Home / Self Care | Payer: Medicare Other | Source: Ambulatory Visit | Attending: Physical Medicine and Rehabilitation | Admitting: Physical Medicine and Rehabilitation

## 2019-10-18 DIAGNOSIS — R7989 Other specified abnormal findings of blood chemistry: Secondary | ICD-10-CM | POA: Insufficient documentation

## 2019-10-18 DIAGNOSIS — M4316 Spondylolisthesis, lumbar region: Secondary | ICD-10-CM

## 2019-10-18 DIAGNOSIS — M48062 Spinal stenosis, lumbar region with neurogenic claudication: Secondary | ICD-10-CM

## 2019-10-18 DIAGNOSIS — M48061 Spinal stenosis, lumbar region without neurogenic claudication: Secondary | ICD-10-CM | POA: Diagnosis not present

## 2019-10-18 DIAGNOSIS — R0602 Shortness of breath: Secondary | ICD-10-CM | POA: Diagnosis not present

## 2019-10-18 DIAGNOSIS — M5416 Radiculopathy, lumbar region: Secondary | ICD-10-CM

## 2019-10-18 LAB — POCT I-STAT CREATININE: Creatinine, Ser: 1.7 mg/dL — ABNORMAL HIGH (ref 0.61–1.24)

## 2019-10-18 MED ORDER — IOHEXOL 350 MG/ML SOLN
100.0000 mL | Freq: Once | INTRAVENOUS | Status: AC | PRN
  Administered 2019-10-18: 100 mL via INTRAVENOUS

## 2019-10-18 MED ORDER — SODIUM CHLORIDE (PF) 0.9 % IJ SOLN
INTRAMUSCULAR | Status: AC
  Filled 2019-10-18: qty 50

## 2019-10-19 ENCOUNTER — Other Ambulatory Visit: Payer: Self-pay

## 2019-10-19 ENCOUNTER — Telehealth: Payer: Self-pay

## 2019-10-19 DIAGNOSIS — Z20828 Contact with and (suspected) exposure to other viral communicable diseases: Secondary | ICD-10-CM | POA: Diagnosis not present

## 2019-10-19 DIAGNOSIS — Z20822 Contact with and (suspected) exposure to covid-19: Secondary | ICD-10-CM

## 2019-10-19 NOTE — Telephone Encounter (Signed)
Patient is scheduled for a repeat injection on 12/31. He states that he needs something sooner because he can hardly use his leg. I advised him that, as per your discussion with him in the office and your last phone call note, he needs to see a spine surgeon or neurosurgeon for this. He asked why we had not yet referred him to a Psychologist, sport and exercise. I reminded him that he told me last week that he would not be able to have surgery any time soon. He did agree today to a referral and requested Dr. Ellene Route. I have placed that referral.

## 2019-10-19 NOTE — Telephone Encounter (Signed)
Yes, thank you. 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 I have sugested surgical referral in the past as well. He may have seen Dr. Louanne Skye in the past.

## 2019-10-19 NOTE — Telephone Encounter (Signed)
[facility] HIM dept faxed request for medical records to Dr. Benson Norway. 10/19/19 vlm

## 2019-10-21 LAB — NOVEL CORONAVIRUS, NAA: SARS-CoV-2, NAA: NOT DETECTED

## 2019-10-31 ENCOUNTER — Other Ambulatory Visit: Payer: Self-pay | Admitting: Neurological Surgery

## 2019-10-31 DIAGNOSIS — M5416 Radiculopathy, lumbar region: Secondary | ICD-10-CM

## 2019-11-10 ENCOUNTER — Ambulatory Visit (INDEPENDENT_AMBULATORY_CARE_PROVIDER_SITE_OTHER): Payer: Medicare Other | Admitting: Physical Medicine and Rehabilitation

## 2019-11-10 ENCOUNTER — Other Ambulatory Visit: Payer: Self-pay

## 2019-11-10 ENCOUNTER — Ambulatory Visit: Payer: Self-pay

## 2019-11-10 VITALS — BP 164/88 | HR 93

## 2019-11-10 DIAGNOSIS — M48062 Spinal stenosis, lumbar region with neurogenic claudication: Secondary | ICD-10-CM

## 2019-11-10 DIAGNOSIS — M4316 Spondylolisthesis, lumbar region: Secondary | ICD-10-CM

## 2019-11-10 DIAGNOSIS — M21371 Foot drop, right foot: Secondary | ICD-10-CM

## 2019-11-10 DIAGNOSIS — M5416 Radiculopathy, lumbar region: Secondary | ICD-10-CM

## 2019-11-10 MED ORDER — METHYLPREDNISOLONE ACETATE 80 MG/ML IJ SUSP: 40.0000 mg | Freq: Once | INTRAMUSCULAR | Status: DC

## 2019-11-10 NOTE — Progress Notes (Signed)
Pt states pain in the middle of the lower back that radiates into the right leg all the way down with numbness in right foot. Pt states pain started awhile ago. Resting and pain medication helps some with pain. Lifting, standing, and house work makes pain worse.   .Numeric Pain Rating Scale and Functional Assessment Average Pain 6   In the last MONTH (on 0-10 scale) has pain interfered with the following?  1. General activity like being  able to carry out your everyday physical activities such as walking, climbing stairs, carrying groceries, or moving a chair?  Rating(7)   +Driver, -BT, -Dye Allergies.

## 2019-11-22 ENCOUNTER — Other Ambulatory Visit (HOSPITAL_COMMUNITY)
Admission: RE | Admit: 2019-11-22 | Discharge: 2019-11-22 | Disposition: A | Discharge status: Home / Self Care | Payer: Medicare Other | Source: Ambulatory Visit | Attending: Pulmonary Disease | Admitting: Pulmonary Disease

## 2019-11-22 DIAGNOSIS — Z01812 Encounter for preprocedural laboratory examination: Secondary | ICD-10-CM | POA: Insufficient documentation

## 2019-11-22 DIAGNOSIS — Z20822 Contact with and (suspected) exposure to covid-19: Secondary | ICD-10-CM | POA: Diagnosis not present

## 2019-11-23 ENCOUNTER — Telehealth: Payer: Self-pay | Admitting: Gastroenterology

## 2019-11-23 ENCOUNTER — Other Ambulatory Visit: Payer: Self-pay

## 2019-11-23 ENCOUNTER — Encounter (HOSPITAL_COMMUNITY): Payer: Self-pay

## 2019-11-23 ENCOUNTER — Ambulatory Visit (HOSPITAL_COMMUNITY)
Admission: RE | Admit: 2019-11-23 | Discharge: 2019-11-23 | Disposition: A | Discharge status: Home / Self Care | Payer: Medicare Other | Source: Ambulatory Visit | Attending: Neurological Surgery | Admitting: Neurological Surgery

## 2019-11-23 ENCOUNTER — Ambulatory Visit (HOSPITAL_COMMUNITY): Payer: Medicare Other

## 2019-11-23 DIAGNOSIS — M5416 Radiculopathy, lumbar region: Secondary | ICD-10-CM

## 2019-11-23 LAB — NOVEL CORONAVIRUS, NAA (HOSP ORDER, SEND-OUT TO REF LAB; TAT 18-24 HRS): SARS-CoV-2, NAA: NOT DETECTED

## 2019-11-25 ENCOUNTER — Ambulatory Visit: Payer: Medicare Other

## 2019-11-25 ENCOUNTER — Other Ambulatory Visit: Payer: Self-pay

## 2019-11-25 DIAGNOSIS — R0600 Dyspnea, unspecified: Secondary | ICD-10-CM

## 2019-11-25 DIAGNOSIS — R0609 Other forms of dyspnea: Secondary | ICD-10-CM

## 2019-11-25 LAB — PULMONARY FUNCTION TEST
DL/VA % pred: 112 %
DL/VA: 4.45 ml/min/mmHg/L
DLCO unc % pred: 90 %
DLCO unc: 21.42 ml/min/mmHg
FEF 25-75 Post: 2.65 L/sec
FEF 25-75 Pre: 2.27 L/sec
FEF2575-%Change-Post: 16 %
FEF2575-%Pred-Post: 135 %
FEF2575-%Pred-Pre: 116 %
FEV1-%Change-Post: 5 %
FEV1-%Pred-Post: 93 %
FEV1-%Pred-Pre: 88 %
FEV1-Post: 2.61 L
FEV1-Pre: 2.48 L
FEV1FVC-%Change-Post: 4 %
FEV1FVC-%Pred-Pre: 108 %
FEV6-%Change-Post: 1 %
FEV6-%Pred-Post: 88 %
FEV6-%Pred-Pre: 86 %
FEV6-Post: 3.21 L
FEV6-Pre: 3.16 L
FEV6FVC-%Change-Post: 0 %
FEV6FVC-%Pred-Post: 107 %
FEV6FVC-%Pred-Pre: 106 %
FVC-%Change-Post: 1 %
FVC-%Pred-Post: 82 %
FVC-%Pred-Pre: 81 %
FVC-Post: 3.21 L
FVC-Pre: 3.17 L
Post FEV1/FVC ratio: 81 %
Post FEV6/FVC ratio: 100 %
Pre FEV1/FVC ratio: 78 %
Pre FEV6/FVC Ratio: 100 %
RV % pred: 108 %
RV: 2.77 L
TLC % pred: 87 %
TLC: 5.93 L

## 2019-12-05 ENCOUNTER — Ambulatory Visit (INDEPENDENT_AMBULATORY_CARE_PROVIDER_SITE_OTHER): Payer: Medicare Other | Admitting: Pulmonary Disease

## 2019-12-05 ENCOUNTER — Telehealth: Payer: Self-pay | Admitting: Pulmonary Disease

## 2019-12-05 ENCOUNTER — Encounter: Payer: Self-pay | Admitting: Pulmonary Disease

## 2019-12-05 ENCOUNTER — Other Ambulatory Visit: Payer: Self-pay

## 2019-12-05 VITALS — BP 130/64 | HR 80 | Temp 98.2°F | Ht 69.0 in | Wt 252.4 lb

## 2019-12-05 DIAGNOSIS — R0602 Shortness of breath: Secondary | ICD-10-CM | POA: Diagnosis not present

## 2019-12-05 DIAGNOSIS — R7989 Other specified abnormal findings of blood chemistry: Secondary | ICD-10-CM

## 2019-12-05 DIAGNOSIS — R0600 Dyspnea, unspecified: Secondary | ICD-10-CM

## 2019-12-05 DIAGNOSIS — Z6834 Body mass index (BMI) 34.0-34.9, adult: Secondary | ICD-10-CM

## 2019-12-05 DIAGNOSIS — R0609 Other forms of dyspnea: Secondary | ICD-10-CM

## 2019-12-05 DIAGNOSIS — E669 Obesity, unspecified: Secondary | ICD-10-CM

## 2019-12-05 LAB — CBC WITH DIFFERENTIAL/PLATELET
Basophils Absolute: 0 10*3/uL (ref 0.0–0.1)
Basophils Relative: 0.6 % (ref 0.0–3.0)
Eosinophils Absolute: 0.2 10*3/uL (ref 0.0–0.7)
Eosinophils Relative: 3.1 % (ref 0.0–5.0)
HCT: 46.5 % (ref 39.0–52.0)
Hemoglobin: 15.4 g/dL (ref 13.0–17.0)
Lymphocytes Relative: 15.2 % (ref 12.0–46.0)
Lymphs Abs: 0.9 10*3/uL (ref 0.7–4.0)
MCHC: 33.2 g/dL (ref 30.0–36.0)
MCV: 92.8 fl (ref 78.0–100.0)
Monocytes Absolute: 0.5 10*3/uL (ref 0.1–1.0)
Monocytes Relative: 8.7 % (ref 3.0–12.0)
Neutro Abs: 4.3 10*3/uL (ref 1.4–7.7)
Neutrophils Relative %: 72.4 % (ref 43.0–77.0)
Platelets: 164 10*3/uL (ref 150.0–400.0)
RBC: 5.01 Mil/uL (ref 4.22–5.81)
RDW: 14.1 % (ref 11.5–15.5)
WBC: 6 10*3/uL (ref 4.0–10.5)

## 2019-12-05 LAB — FERRITIN: Ferritin: 15.4 ng/mL — ABNORMAL LOW (ref 22.0–322.0)

## 2019-12-05 NOTE — Telephone Encounter (Signed)
Spoke with patient and advised his request would be forwarded to Dr. Vaughan Browner. Patient asked for a facility that is in network with his insurance.

## 2019-12-05 NOTE — Telephone Encounter (Signed)
Called the patient back and advised once the doctor has had a chance to review the results and advise of the recommendations he would be notified. The patient stated he received results in my chart.  Dr. Vaughan Browner can you review and advise of recommendations based on lab results received? Thank you.

## 2019-12-05 NOTE — Progress Notes (Signed)
[name]    OP:[id number]    1941/08/28  Primary Care Physician:McNeill, Abigail Butts, MD  Referring Physician: Cari Caraway, MD [place],  [place] [address]  Chief complaint: Follow up for dyspnea  HPI: 79 year old with history of sleep apnea, allergic rhinitis, hyperlipidemia Referred for evaluation of dyspnea.  Symptoms have been going on for the past 2 years.  He has dyspnea on exertion and sometimes at rest.  Associated with wheeze, chest tightness.  Currently on albuterol inhaler.  Previously had tried Advair but cannot tell if it made a difference.  He stopped using it as it was too expensive.  Has seasonal allergies, significant issues with GERD and is on Nexium.  History also noted for colonic polyps.  Previously followed by Dr. Benson Norway Reports seeing blood in the stool recently and intermittent right upper quadrant pain.  Follows with Dr. Gwenlyn Found, cardiology with normal cardiac catheterization in 2008.  Echocardiogram and stress test in 2018 within normal limits.  Symptoms not felt to be related to the heart.  Has bilateral lower extremity edema which is attributed to venous insufficiency.  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  Reports significant basement dampness and flooding at home with mold issues.  No hot tub, Jacuzzi, down pillows, comforters Smoking history: Never smoker Travel history: No significant travel history Relevant family history: No significant family history of lung disease  Interim history: Started on Symbicort inhaler.  He is not using it on a regular basis.. Continues to have significant dyspnea on exertion   Outpatient Encounter Medications as of 12/05/2019  Medication Sig  . B-D 3CC LUER-LOK SYR 21GX1-1/2 21G X 1-1/2" 3 ML MISC USE TO INJECT DEPOTESTOSTERONE.  [name] buPROPion (WELLBUTRIN XL) 300 MG 24 hr tablet TAKE 1 TABLET BY MOUTH DAILY *NEEDS  OFFICE VISIT  . calcium carbonate 200 MG capsule Take 200 mg by mouth daily.   . cetirizine (ZYRTEC) 10 MG tablet Take 10 mg by mouth every morning.   [name] doxazosin (CARDURA) 8 MG tablet TAKE 1 TABLET BY MOUTH DAILY  . Take at night (Patient taking differently: every evening. TAKE 1 TABLET BY MOUTH DAILY  . Take at night)  . esomeprazole (NEXIUM) 20 MG capsule Take 1 capsule (20 mg total) by mouth daily.  [name] etodolac (LODINE) 400 MG tablet Take 1 tablet (400 mg total) by mouth 2 (two) times daily as needed.  . flurazepam (DALMANE) 15 MG capsule Take 1 capsule (15 mg total) by mouth at bedtime as needed for sleep.  . fluticasone (FLONASE) 50 MCG/ACT nasal spray Place 2 sprays into both nostrils daily.  . furosemide (LASIX) 20 MG tablet TAKE 2 TABLETS BY MOUTH ONCE A DAY  . gabapentin (NEURONTIN) 300 MG capsule TAKE 1 TO 2 CAPSULES BY MOUTH AT BEDTIME  . glucosamine-chondroitin 500-400 MG tablet Take 1 tablet by mouth 2 (two) times daily.  [name] HYDROcodone-acetaminophen (NORCO) 5-325 MG tablet 1 po q d prn pain  . ipratropium (ATROVENT) 0.06 % nasal spray   . meloxicam (MOBIC) 15 MG tablet Take 0.5-1 tablets (7.5-15 mg total) by mouth daily as needed for pain.  . meloxicam (MOBIC) 7.5 MG tablet TAKE 1-2 TABLETS BY MOUTH DAILY  . methocarbamol (ROBAXIN) 500 MG tablet Take 1 tablet (500 mg total) by mouth every 6 (six) hours as needed for muscle spasms.  . Multiple Vitamin (MULTIVITAMIN) tablet Take 1 tablet  by mouth daily.  [name] NEEDLE, DISP, 21 G 21G X 1-1/2" MISC Use to inject depotestosterone.  . Potassium Chloride ER 20 MEQ TBCR Take 1 tablet by mouth 2 (two) times daily with a meal.  . potassium chloride SA (K-DUR,KLOR-CON) 20 MEQ tablet Take 1 tablet (20 mEq total) by mouth 2 (two) times daily.  [name] PROAIR HFA 108 (90 Base) MCG/ACT inhaler   . rosuvastatin (CRESTOR) 5 MG tablet Take 5 mg by mouth daily.  . sildenafil (REVATIO) 20 MG tablet Take 5 tablets daily as needed for erectile dysfunction.  .  Syringe, Reusable, 3 ML MISC Use to inject depotestosterone.  . testosterone cypionate (DEPOTESTOTERONE CYPIONATE) 100 MG/ML injection Inject 1 mL (100 mg total) into the muscle every 7 (seven) days. For IM use only  . [DISCONTINUED] doxazosin (CARDURA) 4 MG tablet Take 4 mg by mouth daily.  . budesonide-formoterol (SYMBICORT) 160-4.5 MCG/ACT inhaler Inhale 2 puffs into the lungs 2 (two) times daily. (Patient not taking: Reported on 12/05/2019)  . LORazepam (ATIVAN) 0.5 MG tablet Take 1 tablet as needed for stress on a daily basis. Not to be taken with pain meds (Patient not taking: Reported on 12/05/2019)  . LORazepam (ATIVAN) 1 MG tablet TAKE 1 TABLET BY MOUTH TWICE DAILY AS NEEDED FOR 30 DAYS  . [DISCONTINUED] budesonide-formoterol (SYMBICORT) 160-4.5 MCG/ACT inhaler Inhale 2 puffs into the lungs 2 (two) times daily.  . [DISCONTINUED] citalopram (CELEXA) 10 MG tablet Take 10 mg by mouth daily.   Facility-Administered Encounter Medications as of 12/05/2019  Medication  . methylPREDNISolone acetate (DEPO-MEDROL) injection 40 mg    Allergies as of 12/05/2019  . (No Known Allergies)   Physical Exam: Blood pressure 130/64, pulse 80, temperature 98.2 F (36.8 C), temperature source Temporal, height 5\' 9"  (1.753 m), weight 252 lb 6.4 oz (114.5 kg), SpO2 97 %. Gen:      No acute distress HEENT:  EOMI, sclera anicteric Neck:     No masses; no thyromegaly Lungs:    Clear to auscultation bilaterally; normal respiratory effort CV:         Regular rate and rhythm; no murmurs Abd:      + bowel sounds; soft, non-tender; no palpable masses, no distension Ext:    No edema; adequate peripheral perfusion Skin:      Warm and dry; no rash Neuro: alert and oriented x 3 Psych: normal mood and affect  Data Reviewed: Imaging: CTA 08/13/2018 no pulmonary embolism.  Bronchial wall thickening.  Dependent atelectasis.  CT chest 08/16/2019-mild dependent atelectasis, No evidence of interstitial lung disease. I  have reviewed the images personally.  CTA 10/18/2019-no pulmonary embolism or acute disease I have reviewed the images personally.  PFTs  11/25/2019 FVC 3.21 [82%], FEV1 2.61 [93%], F/F 81, TLC 5.93 [87%], DLCO 21.42 [90%] Normal test.  Cardiac: Echocardiogram 02/18/2017- LVEF 0000000, grade 1 diastolic dysfunction  Nuclear stress test 02/19/2017-mild diffuse hypokinesis, EF 45%.  Low risk study with no ischemia identified.   Labs: CMP 08/05/2019-significant for creatinine of 1.64, hepatic panel within normal limits CBC 08/05/2019-WBC 5.6, eos 4, absolute eosinophil count 224 IgE 08/05/2019-89 Hypersensitivity panel 08/05/2019-negative BNP 08/05/2019-42  SARS-CoV-2 09/06/2019-negative  Sleep PSG 12/01/2018- severe sleep apnea with AHI 90.6 desaturations to 76%. CPAP titration/2/20- recommend trial CPAP on 11  Assessment:  Evaluation for dyspnea Suspect reactive airway disease, asthma, with contribution from deconditioning, body habitus.  He has significant mold exposure at home but no evidence of hypersensitivity pneumonitis on labs or CT scan  Advised him  to start using the Symbicort twice daily for maximal effect He will benefit from weight loss with diet and exercise but his mobility is limited with back issues, spinal stenosis and he is being worked up for this by Dr. Ellene Route.  CT reviewed with mild dependent atelectasis and no evidence of interstitial lung disease.  PFTs are normal.  GERD, blood in stool, Intermittent right upper quadrant pain Scheduled to see [facility] Continue Nexium Check CBC to ensure it is not anemic.  Severe Obstructive sleep apnea On CPAP  Plan/Recommendations: - Symbicort twice daily - Encouraged to lose weight - Check CBC  Marshell Garfinkel MD  Pulmonary and Critical Care 12/05/2019, 9:24 AM  CC: Cari Caraway, MD

## 2019-12-05 NOTE — Telephone Encounter (Signed)
Spoke with pt, Dr. Vaughan Browner is ok with referring him to healthy weight loss clinic ([facility]). Order placed. Pt aware.  Nothing further needed at this time- will close encounter.

## 2019-12-05 NOTE — Patient Instructions (Addendum)
We will check a CBC with differential, IgE, iron studies today  Need to use the Symbicort every day twice daily for maximum effect We will refer to pharmacy for inhaler review and training Follow-up with GI regarding blood in the stools  Follow-up in 3 months.

## 2019-12-06 ENCOUNTER — Ambulatory Visit (HOSPITAL_COMMUNITY)
Admission: RE | Admit: 2019-12-06 | Discharge: 2019-12-06 | Disposition: A | Discharge status: Home / Self Care | Payer: Medicare Other | Source: Ambulatory Visit | Attending: Neurological Surgery | Admitting: Neurological Surgery

## 2019-12-06 ENCOUNTER — Other Ambulatory Visit: Payer: Self-pay

## 2019-12-06 DIAGNOSIS — M5416 Radiculopathy, lumbar region: Secondary | ICD-10-CM

## 2019-12-06 DIAGNOSIS — M48061 Spinal stenosis, lumbar region without neurogenic claudication: Secondary | ICD-10-CM | POA: Diagnosis not present

## 2019-12-06 DIAGNOSIS — M5116 Intervertebral disc disorders with radiculopathy, lumbar region: Secondary | ICD-10-CM | POA: Insufficient documentation

## 2019-12-06 DIAGNOSIS — Q762 Congenital spondylolisthesis: Secondary | ICD-10-CM | POA: Diagnosis not present

## 2019-12-06 LAB — IRON, TOTAL/TOTAL IRON BINDING CAP
%SAT: 28 % (calc) (ref 20–48)
Iron: 95 ug/dL (ref 50–180)
TIBC: 340 mcg/dL (calc) (ref 250–425)

## 2019-12-06 LAB — IGE: IgE (Immunoglobulin E), Serum: 85 kU/L (ref <=114)

## 2019-12-06 MED ORDER — HYDROCODONE-ACETAMINOPHEN 5-325 MG PO TABS: 1.0000 | ORAL_TABLET | ORAL | Status: DC | PRN

## 2019-12-06 MED ORDER — ONDANSETRON HCL 4 MG/2ML IJ SOLN: 4.0000 mg | Freq: Four times a day (QID) | INTRAMUSCULAR | Status: DC | PRN

## 2019-12-06 MED ORDER — LIDOCAINE HCL (PF) 1 % IJ SOLN
5.0000 mL | Freq: Once | INTRAMUSCULAR | Status: AC
  Administered 2019-12-06: 5 mL

## 2019-12-06 MED ORDER — IOHEXOL 180 MG/ML  SOLN
20.0000 mL | Freq: Once | INTRAMUSCULAR | Status: AC | PRN
  Administered 2019-12-06: 12 mL via INTRATHECAL

## 2019-12-06 MED ORDER — DIAZEPAM 5 MG PO TABS
10.0000 mg | ORAL_TABLET | Freq: Once | ORAL | Status: AC
  Administered 2019-12-06: 10 mg via ORAL
  Filled 2019-12-06 (×2): qty 2

## 2019-12-06 NOTE — Progress Notes (Signed)
Discharge instructions reviewed with pt voices understanding.  

## 2019-12-06 NOTE — Progress Notes (Signed)
Spoke with Otila Kluver in Radiology States Dr Ellene Route is ok to proceed. (pt doesn't have any one to stay with him after procedure)

## 2019-12-06 NOTE — Progress Notes (Signed)
Spoke with Otila Kluver in radiology informed her that pt does not have any one to stay with him after the procedure. Otila Kluver states she will let Dr Ellene Route know and call me back.

## 2019-12-06 NOTE — Progress Notes (Signed)
This RN received report from [place], Therapist, sports at 1330.

## 2019-12-06 NOTE — Discharge Instructions (Signed)
Myelogram  A myelogram is an imaging test. This test checks for problems in the spinal cord and the places where nerves attach to the spinal cord (nerve roots). A dye (contrast material) is put into your spine before the X-ray. This provides a clearer image for your doctor to see. You may need this test if you have a spinal cord problem that cannot be diagnosed with other imaging tests. You may also have this test to check your spine after surgery. Tell a doctor about:  Any allergies you have, especially to iodine.  All medicines you are taking, including vitamins, herbs, eye drops, creams, and over-the-counter medicines.  Any problems you or family members have had with anesthetic medicines or dye.  Any blood disorders you have.  Any surgeries you have had.  Any medical conditions you have or have had, including asthma.  Whether you are pregnant or may be pregnant. What are the risks? Generally, this is a safe procedure. However, problems may occur, including:  Infection.  Bleeding.  Allergic reaction to medicines or dyes.  Damage to your spinal cord or nerves.  Leaking of spinal fluid. This can cause a headache.  Damage to kidneys.  Seizures. This is rare. What happens before the procedure?  Follow instructions from your doctor about what you cannot eat or drink. You may be asked to drink more fluids.  Ask your doctor about changing or stopping your normal medicines. This is important if you take diabetes medicines or blood thinners.  Plan to have someone take you home from the hospital or clinic.  If you will be going home right after the procedure, plan to have someone with you for 24 hours. What happens during the procedure?  You will lie face down on a table.  Your doctor will find the best injection site on your spine. This is most often in the lower back.  This area will be washed with soap.  You will be given a medicine to numb the area (local  anesthetic).  Your doctor will place a long needle into the space around your spinal cord.  A sample of spinal fluid may be taken. This may be sent to the lab for testing.  The dye will be injected into the space around your spinal cord.  The exam table may be tilted. This helps the dye flow up or down your spine.  The X-ray will take images of your spinal cord.  A bandage (dressing) may be placed over the area where the dye was injected. The procedure may vary among doctors and hospitals. What can I expect after the procedure?  You may be monitored until you leave the hospital or clinic. This includes checking your blood pressure, heart rate, breathing rate, and blood oxygen level.  You may feel sore at the injection site. You may have a mild headache.  You will be told to lie flat with your head raised (elevated). This lowers the risk of a headache.  It is up to you to get the results of your procedure. Ask your doctor, or the department that is doing the procedure, when your results will be ready. Follow these instructions at home:   Rest as told by your doctor. Lie flat with your head slightly elevated.  Do not bend, lift, or do hard work for 24-48 hours, or as told by your doctor.  Take over-the-counter and prescription medicines only as told by your doctor.  Take care of your bandage as told by your   doctor.  Drink enough fluid to keep your pee (urine) pale yellow.  Bathe or shower as told by your doctor. Contact a doctor if:  You have a fever.  You have a headache that lasts longer than 24 hours.  You feel sick to your stomach (nauseous).  You vomit.  Your neck is stiff.  Your legs feel numb.  You cannot pee.  You cannot poop (no bowel movement).  You have a rash.  You are itchy or sneezing. Get help right away if:  You have new symptoms or your symptoms get worse.  You have a seizure.  You have trouble breathing. Summary  A myelogram is an  imaging test that checks for problems in the spinal cord and the places where nerves attach to the spinal cord (nerve roots).  Before the procedure, follow instructions from your doctor. You will be told what not to eat or drink, or what medicines to change or stop.  After the procedure, you will be told to lie flat with your head raised (elevated). This will lower your risk of a headache.  Do not bend, lift, or do any hard work for 24-48 hours, or as told by your doctor.  Contact a doctor if you have a stiff neck or numb legs. Get help right away if your symptoms get worse, or you have a seizure or trouble breathing. This information is not intended to replace advice given to you by your health care provider. Make sure you discuss any questions you have with your health care provider. Document Revised: 01/05/2019 Document Reviewed: 01/06/2019 Elsevier Patient Education  2020 [employer] Inc.  

## 2019-12-06 NOTE — Procedures (Signed)
Mr. 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  This is gotten progressively worse and more refractory in the recent past and he is now advised regarding myelography and a post myelogram CAT scan as he has multiple levels of spondylitic stenosis and motion films are desired to evaluate for dynamic component to his stenosis.  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

## 2019-12-07 DIAGNOSIS — R030 Elevated blood-pressure reading, without diagnosis of hypertension: Secondary | ICD-10-CM | POA: Diagnosis not present

## 2019-12-07 DIAGNOSIS — M4316 Spondylolisthesis, lumbar region: Secondary | ICD-10-CM | POA: Diagnosis not present

## 2019-12-07 DIAGNOSIS — Z6837 Body mass index (BMI) 37.0-37.9, adult: Secondary | ICD-10-CM | POA: Diagnosis not present

## 2019-12-07 DIAGNOSIS — M48062 Spinal stenosis, lumbar region with neurogenic claudication: Secondary | ICD-10-CM | POA: Diagnosis not present

## 2019-12-07 NOTE — Telephone Encounter (Signed)
I called and spoke with patient on 1/26 He has mild decrease in ferritin levels but iron and hemoglobin is normal Awaiting GI eval.  He will discuss discuss iron replacement with his primary care and GI

## 2019-12-08 DIAGNOSIS — R790 Abnormal level of blood mineral: Secondary | ICD-10-CM | POA: Diagnosis not present

## 2019-12-08 DIAGNOSIS — K219 Gastro-esophageal reflux disease without esophagitis: Secondary | ICD-10-CM | POA: Diagnosis not present

## 2019-12-08 DIAGNOSIS — J4520 Mild intermittent asthma, uncomplicated: Secondary | ICD-10-CM | POA: Diagnosis not present

## 2019-12-08 DIAGNOSIS — F5101 Primary insomnia: Secondary | ICD-10-CM | POA: Diagnosis not present

## 2019-12-08 DIAGNOSIS — M48062 Spinal stenosis, lumbar region with neurogenic claudication: Secondary | ICD-10-CM | POA: Diagnosis not present

## 2019-12-08 DIAGNOSIS — G479 Sleep disorder, unspecified: Secondary | ICD-10-CM | POA: Diagnosis not present

## 2019-12-14 NOTE — Progress Notes (Signed)
Referring Provider: Marshell Garfinkel, MD Primary Care Physician:  Cari Caraway, MD  Reason for Consultation:  Blood in the stool, "gut"   IMPRESSION:  Blood in the stool without associated anemia    - no obvious source identified on review of colonoscopy from 2018    - add a daily stool bulking agent such as Metamucil    - colonoscopy recommended Dyspepsia presenting with eructation, early satiety, flatus, post-prandial bloating, and epigastric pain    - ? Related to GERD or a concurrent process    - maximize treatment of GERD    - plan EGD for further evaluation    - consider a trial of FDGard while evaluation for organic etiologies is underway GERD with ongoing symptoms despite daily Nexium and Carafate    - increased Nexium to 40 mg twice daily    - increased Carafate to QID    - reviewed lifestyle modifications, not lying down immediately after dinner may improve his symptoms    - plan EGD to reassess for disease activity Fatigue    - etiology of fatigue is unclear, ? Related to GI symptoms    - consider celiac - will obtain duodenal biopsies on EGD Sigmoid diverticulosis History of colon polyps    - tubular adenoma and SSA on colonoscopy with Dr. Benson Norway 2018    - surveillance recommended in 5 years No known family history of colon cancer or polyps  PLAN: Increasing Nexium to 40 mg twice daily May use Carafate 1 g QID Add a daily dose of Metamucil for stool bulking Reviewed lifestyle modifications for reflux Colonoscopy EGD Abdominal ultrasound if endoscopic evaluation is negative Impedance pH and manometry testing if appropriate after EGD  I spent over 70 minutes of time, including in depth chart review,  face-to-face time with the patient, coordinating care and questions/concerns about the patient regarding our endoscopy center requirements for a driver, ordering studies and medications as appropriate, and documentation of the encounter.     HPI: [name] is  a 79 y.o. male self-referred for the "gut."  The history is obtained to the patient, review of his electronic health record, and 17 pages of records provided from the office of Dr. Benson Norway. He has sleep apnea, allergic rhinitis, asthma, prediabetes, hypertension, obesity, hyperlipidemia and GERD.  Umbilical hernia repair with Dr. Zigmund Daniel 2017. Takes testosterone.  Wanted to transfer care to a new gastroenterologist. Retired [employer]. Attended [name] in [place], [name].  Currently works with [name].  He has been followed by Dr. Benson Norway for several years with his last visit 05/04/2019. EGD with Dr. Benson Norway for dysphagia 05/29/2015 showed a normal esophagus including esophageal biopsies.  Normal stomach and duodenum.  Impedance pH and manometry testing recommended but never scheduled. At that time of his most recent office visit 05/04/19 he was reporting reflux, abdominal distention with a firm abdomen, and fatigue.  His symptoms were previously controlled on Nexium alternating with omeprazole.  Dr. Benson Norway added Carafate 1 g tablets 4 times daily.  Dr. Benson Norway noted that he had gained 25 pounds since their initial consultation.   History of diarrhea that improved with VSL #3.  History of colon polyps previously followed by Dr. Benson Norway.  Colonoscopy with Dr. Benson Norway 12/09/16: sigmoid diverticulosis, a small tubular adenoma and a small sessile serrated adenoma.  Surveillance colonoscopy recommended in 5 years.  Prior concerns today include: Severe reflux that "takes him to the floor."  Reflux for years, but historically not this bad.  Eructation,  early satiety, flatus, bloating, and epigastric pain.  Symptoms occur within minutes of eating. Drinking water causes the eructation.  Symptoms worse if he lies down after eating.  Has identified some foods that are triggers but he is unable to name them. Chips is the one that he can name.   He was previously on Nexium. He has been uses generic esmeprozole once tablet  daily.  One year ago he was prescribed Carafate. He will use it QD-BID for symptom control.  Has never used it QID. Has not tired any other OTC meds, herbal therapies, acupunture, Chinese therapies, or diet changes to manage his symptoms.  All symptoms are worse over the last 6-8 months. Associated shortness of breath and poor energy.  Fatigue has him particularly concerned.  Appetite is normal.  Weight stable despite efforts to lose weight.  Unable to exercise due to back pain.   Reports seeing blood in the stool recently. Not with every BM but seen every 3-5 days.   Labs 12/05/19: Hemoglobin 15.4 No recent abdominal imaging  Limited knowledge about family history. No known family history of colon cancer or polyps. No family history of uterine/endometrial cancer, pancreatic cancer or gastric/stomach cancer.   Past Medical History:  Diagnosis Date  . Allergy    takes Zyrtec daily  . Anxiety    takes Ativan daily as needed  . Arthritis   . Back pain   . BPH (benign prostatic hyperplasia)    takes doxazosin for  . 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 Depression    takes Wellbutrin daily  . Dyspnea on exertion    with exertion  . GERD (gastroesophageal reflux disease)    takes Nexium and Omeprazole daily  . History of kidney stones   . 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 Hyperlipidemia   . Neuromuscular disorder ([place])   . Sleep apnea   . Swelling of lower extremity    right more than leg leg  . Umbilical hernia     Past Surgical History:  Procedure Laterality Date  . 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 BACK SURGERY  2003   L4, L5  . BACK SURGERY    . ENDOVENOUS ABLATION SAPHENOUS VEIN W/ LASER Right 06-01-2014   EVLA right greater saphenous vein by Curt Jews MD   . epidural steroid injections        [facility] dr newton  . hernia repair  2003  . HERNIA REPAIR     hernia inguinal x 2  . 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 UMBILICAL HERNIA REPAIR N/A 06/27/2016   Procedure: LAPAROSCOPIC ASSISTED REPAIR OF UMBILICAL HERINA WITH MESH;  Surgeon: Johnathan Hausen, MD;  Location: [facility];  Service: General;  Laterality: N/A;  . VASCULAR SURGERY  2015   right leg    Current Outpatient Medications  Medication Sig Dispense Refill  . B-D 3CC LUER-LOK SYR 21GX1-1/2 21G X 1-1/2" 3 ML MISC USE TO INJECT DEPOTESTOSTERONE. 20 each 0  . budesonide-formoterol (SYMBICORT) 160-4.5 MCG/ACT inhaler Inhale 2 puffs into the lungs 2 (two) times daily. 1 Inhaler 3  . buPROPion (WELLBUTRIN XL) 300 MG 24 hr tablet TAKE 1 TABLET BY MOUTH DAILY *NEEDS OFFICE VISIT 30 tablet 11  . calcium carbonate 200 MG capsule Take  200 mg by mouth daily.     . cetirizine (ZYRTEC) 10 MG tablet Take 10 mg by mouth every morning.     [name] doxazosin (CARDURA) 8 MG tablet TAKE 1 TABLET BY MOUTH DAILY  . Take at night (Patient taking differently: every evening. TAKE 1 TABLET BY MOUTH DAILY  . Take at night) 30 tablet 11  . esomeprazole (NEXIUM) 20 MG capsule Take 1 capsule (20 mg total) by mouth daily. 90 capsule 1  . etodolac (LODINE) 400 MG tablet Take 1 tablet (400 mg total) by mouth 2 (two) times daily as needed. 60 tablet 6  . flurazepam (DALMANE) 15 MG capsule Take 1 capsule (15 mg total) by mouth at bedtime as needed for sleep. 30 capsule 5  . fluticasone (FLONASE) 50 MCG/ACT nasal spray Place 2 sprays into both nostrils daily. 16 g 6  . furosemide (LASIX) 20 MG tablet TAKE 2 TABLETS BY MOUTH ONCE A DAY 60 tablet 0  . gabapentin (NEURONTIN) 300 MG capsule TAKE 1 TO 2 CAPSULES BY MOUTH AT BEDTIME 60 capsule 0  . glucosamine-chondroitin 500-400 MG tablet Take 1 tablet by mouth 2 (two) times daily.    [name] HYDROcodone-acetaminophen (NORCO) 5-325 MG tablet 1 po q d prn pain 45 tablet 0  . ipratropium  (ATROVENT) 0.06 % nasal spray     . LORazepam (ATIVAN) 0.5 MG tablet Take 1 tablet as needed for stress on a daily basis. Not to be taken with pain meds (Patient not taking: Reported on 12/05/2019) 30 tablet 0  . LORazepam (ATIVAN) 1 MG tablet TAKE 1 TABLET BY MOUTH TWICE DAILY AS NEEDED FOR 30 DAYS    . meloxicam (MOBIC) 15 MG tablet Take 0.5-1 tablets (7.5-15 mg total) by mouth daily as needed for pain. 90 tablet 3  . meloxicam (MOBIC) 7.5 MG tablet TAKE 1-2 TABLETS BY MOUTH DAILY 60 tablet 1  . methocarbamol (ROBAXIN) 500 MG tablet Take 1 tablet (500 mg total) by mouth every 6 (six) hours as needed for muscle spasms. 60 tablet 3  . Multiple Vitamin (MULTIVITAMIN) tablet Take 1 tablet by mouth daily.    [name] NEEDLE, DISP, 21 G 21G X 1-1/2" MISC Use to inject depotestosterone. 50 each 0  . Potassium Chloride ER 20 MEQ TBCR Take 1 tablet by mouth 2 (two) times daily with a meal.    . potassium chloride SA (K-DUR,KLOR-CON) 20 MEQ tablet Take 1 tablet (20 mEq total) by mouth 2 (two) times daily. 18 tablet 3  . PROAIR HFA 108 (90 Base) MCG/ACT inhaler     . rosuvastatin (CRESTOR) 5 MG tablet Take 5 mg by mouth daily.    . sildenafil (REVATIO) 20 MG tablet Take 5 tablets daily as needed for erectile dysfunction. 50 tablet 11  . Syringe, Reusable, 3 ML MISC Use to inject depotestosterone. 1 each 0  . testosterone cypionate (DEPOTESTOTERONE CYPIONATE) 100 MG/ML injection Inject 1 mL (100 mg total) into the muscle every 7 (seven) days. For IM use only 10 mL 1   Current Facility-Administered Medications  Medication Dose Route Frequency Provider Last Rate Last Admin  . methylPREDNISolone acetate (DEPO-MEDROL) injection 40 mg  40 mg Other Once Magnus Sinning, MD        Allergies as of 12/15/2019  . (No Known Allergies)    Family History  Problem Relation Age of Onset  . Thyroid disease Mother   . Heart disease Father   . Diabetes Maternal Grandfather     Social History   Socioeconomic  History    . Marital status: Single    Spouse name: Not on file  . Number of children: Not on file  . Years of education: Not on file  . Highest education level: Not on file  Occupational History  . 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 Smoking status: Never Smoker  . Smokeless tobacco: Never Used  Substance and Sexual Activity  . Alcohol use: No  . Drug use: No  . Sexual activity: Yes    Birth control/protection: Condom  Other Topics Concern  . Not on file  Social History Narrative   ** Merged History Encounter **       Single. Education: Other.    Social Determinants of Health   Financial Resource Strain:   . Difficulty of Paying Living Expenses: Not on file  Food Insecurity:   . Worried About Charity fundraiser in the Last Year: Not on file  . Ran Out of Food in the Last Year: Not on file  Transportation Needs:   . Lack of Transportation (Medical): Not on file  . Lack of Transportation (Non-Medical): Not on file  Physical Activity:   . Days of Exercise per Week: Not on file  . Minutes of Exercise per Session: Not on file  Stress:   . Feeling of Stress : Not on file  Social Connections:   . Frequency of Communication with Friends and Family: Not on file  . Frequency of Social Gatherings with Friends and Family: Not on file  . Attends Religious Services: Not on file  . Active Member of Clubs or Organizations: Not on file  . Attends Archivist Meetings: Not on file  . Marital Status: Not on file  Intimate Partner Violence:   . Fear of Current or Ex-Partner: Not on file  . Emotionally Abused: Not on file  . Physically Abused: Not on file  . Sexually Abused: Not on file    Review of Systems: 12 system ROS is negative except as noted above with the additions of arthritis, back pain, blood in the urine, vision changes, fatigue, shortness of breath, sleeping problems, swelling of the feet, excessive urination.   Physical  Exam: General:   Alert,  well-nourished, pleasant and cooperative in NAD Head:  Normocephalic and atraumatic. Eyes:  Sclera clear, no icterus.   Conjunctiva pink. Ears:  Normal auditory acuity. Nose:  No deformity, discharge,  or lesions. Mouth:  No deformity or lesions.   Neck:  Supple; no masses or thyromegaly. Lungs:  Clear throughout to auscultation.   No wheezes. Heart:  Regular rate and rhythm; no murmurs. Abdomen:  Soft, protuberent, central obesity, nontender, nondistended, normal bowel sounds, no rebound or guarding. No hepatosplenomegaly.   Rectal:  Deferred  Msk:  Symmetrical. No boney deformities LAD: No inguinal or umbilical LAD Extremities:  No clubbing or edema. Neurologic:  Alert and  oriented x4;  grossly nonfocal Skin:  Intact without significant lesions or rashes. Psych:  Alert and cooperative. Normal mood and affect.    Bradlee Bridgers L. Tarri Glenn, MD, MPH 12/14/2019, 5:29 PM

## 2019-12-15 ENCOUNTER — Telehealth: Payer: Self-pay | Admitting: Pulmonary Disease

## 2019-12-15 ENCOUNTER — Ambulatory Visit (INDEPENDENT_AMBULATORY_CARE_PROVIDER_SITE_OTHER): Payer: Medicare Other | Admitting: Gastroenterology

## 2019-12-15 ENCOUNTER — Encounter: Payer: Self-pay | Admitting: Gastroenterology

## 2019-12-15 VITALS — BP 148/72 | HR 84 | Temp 98.1°F | Ht 69.5 in | Wt 256.0 lb

## 2019-12-15 DIAGNOSIS — R140 Abdominal distension (gaseous): Secondary | ICD-10-CM

## 2019-12-15 DIAGNOSIS — K219 Gastro-esophageal reflux disease without esophagitis: Secondary | ICD-10-CM | POA: Diagnosis not present

## 2019-12-15 DIAGNOSIS — Z9109 Other allergy status, other than to drugs and biological substances: Secondary | ICD-10-CM

## 2019-12-15 DIAGNOSIS — K921 Melena: Secondary | ICD-10-CM

## 2019-12-15 MED ORDER — PROAIR HFA 108 (90 BASE) MCG/ACT IN AERS: 1.0000 | INHALATION_SPRAY | Freq: Four times a day (QID) | RESPIRATORY_TRACT | 6 refills | Status: DC | PRN

## 2019-12-15 MED ORDER — FLUTICASONE PROPIONATE 50 MCG/ACT NA SUSP: 2.0000 | Freq: Every day | NASAL | 3 refills | Status: DC

## 2019-12-15 MED ORDER — ESOMEPRAZOLE MAGNESIUM 40 MG PO CPDR: 40.0000 mg | DELAYED_RELEASE_CAPSULE | Freq: Two times a day (BID) | ORAL | 3 refills | Status: DC

## 2019-12-15 MED ORDER — BUDESONIDE-FORMOTEROL FUMARATE 160-4.5 MCG/ACT IN AERO: 2.0000 | INHALATION_SPRAY | Freq: Two times a day (BID) | RESPIRATORY_TRACT | 3 refills | Status: DC

## 2019-12-15 NOTE — Telephone Encounter (Signed)
See other phone notes on pt. Multiple issues to handle. Quentin Ore, CMA, lmtcb for pt.

## 2019-12-15 NOTE — Telephone Encounter (Signed)
Ran eligibility check and found no active prescription coverage. Called [name] and pharmacy tech advised that patient only fills with Goodrx or other discount cards. [place] patient assistance would be best option for this patient.  12:25 PM Beatriz Chancellor, CPhT

## 2019-12-15 NOTE — Telephone Encounter (Signed)
Patient is returning phone call. Patient phone number is [phone]. 

## 2019-12-15 NOTE — Patient Instructions (Signed)
Increase your Nexium to 40 mg to twice daily.   Use Carafate 1 gram four times a day.   Patient advised to avoid spicy, acidic, citrus, chocolate, mints, fruit and fruit juices.  Limit the intake of caffeine, alcohol and Soda.  Don't exercise too soon after eating.  Don't lie down within 3-4 hours of eating.  Elevate the head of your bed.  It has been recommended to you by your physician that you have a(n) endoscopy/colonoscopy completed. Per your request, we did not schedule the procedure(s) today. Please contact our office at [phone] should you decide to have the procedure completed. You will be scheduled for a pre-visit and procedure at that time.

## 2019-12-15 NOTE — Telephone Encounter (Signed)
I will route this to Dr. Vaughan Browner and wait for further recommendations

## 2019-12-15 NOTE — Telephone Encounter (Signed)
Called pt but unable to reach. Left message for pt to return call. 

## 2019-12-15 NOTE — Telephone Encounter (Signed)
ATC pt, no answer. Left message for pt to call back.  

## 2019-12-15 NOTE — Telephone Encounter (Signed)
Patient in office to get refills on medication. App of day signed hand Rx for patient. Proair, symbicort, flonase.  Patient also scheduled with the pharmacy team.   Patient states the symbicort even with Good Rx is going to be over $100.   Dr. Vaughan Browner please advise if change inhalers or wait for recommendations from pharmacy  Routing to pharm team, any help for this patient to stay on symbicort?

## 2019-12-16 ENCOUNTER — Ambulatory Visit: Payer: Medicare Other

## 2019-12-16 NOTE — Telephone Encounter (Signed)
There was another message open on this issue. I have mailed out the patient assistance forms for the pt.

## 2019-12-16 NOTE — Telephone Encounter (Signed)
Spoke with pt. States that Symbicort is too expensive due to him not having prescription drug coverage. Advised him that I would mail him a copy of the patient assistance form. He verbalized understanding and I verified his address.

## 2019-12-16 NOTE — Telephone Encounter (Signed)
Spoke with pt. Advised him that currently there is a wait list for the weight loss clinic but they should get in touch with him to set up an appointment. Pt verbalized understanding. Nothing further was needed.

## 2019-12-19 ENCOUNTER — Telehealth: Payer: Self-pay | Admitting: Pulmonary Disease

## 2019-12-19 MED ORDER — ALBUTEROL SULFATE HFA 108 (90 BASE) MCG/ACT IN AERS: 2.0000 | INHALATION_SPRAY | Freq: Four times a day (QID) | RESPIRATORY_TRACT | 1 refills | Status: DC | PRN

## 2019-12-19 NOTE — Telephone Encounter (Signed)
Spoke with the pt  I advised that we can not email the forms for pt assistance, but we did mail him some on 12/16/19  I verified his address  I refilled his albuterol hfa per his request  Nothing further needed

## 2019-12-19 NOTE — Telephone Encounter (Signed)
Patient states has not gotten coupon thru email.

## 2019-12-23 ENCOUNTER — Ambulatory Visit (INDEPENDENT_AMBULATORY_CARE_PROVIDER_SITE_OTHER): Payer: Medicare Other | Admitting: Pharmacist

## 2019-12-23 ENCOUNTER — Other Ambulatory Visit: Payer: Self-pay

## 2019-12-23 DIAGNOSIS — R0602 Shortness of breath: Secondary | ICD-10-CM

## 2019-12-23 DIAGNOSIS — G4730 Sleep apnea, unspecified: Secondary | ICD-10-CM | POA: Diagnosis not present

## 2019-12-23 NOTE — Progress Notes (Signed)
Subjective:  Patient presents today to [facility] to see pharmacy team for inhaler optimization.  Past medical history includes allergic rhinitis, GERD, varicose veins, OSA, hypogonadism, lumbar radiculopathy, degerative arthritis, basal cell carcinoma, HLD, and hx of umbilical hernia s/p lap repair (2017). Patient was last seen and referred by Dr. Vaughan Browner on 12/05/19. Dr. Vaughan Browner is in the process of working patient up to determine underlying cause of dyspnea; he suspects reactive airway disease, asthma, with contribution from deconditioning/body habitus. He was initiated on Symbicort, however, has not been able to use it due to cost (patient does not have insurance).  Patient presents to initial appt with pharmacy team. Patient has not been adherent to maintenance inhalers; he reports he uses them every "couple" days. He states he has not noticed a difference in his breathing when using maintenance inhalers. Patient ran out of Symbicort 4 days before pharmacy appt. He complains that his dyspnea is the worst upon exertion. He specifies he has the most difficulty breathing when moving objects around at his job (reports working at [employer]). He is adamant about not wanting to purchase insurance coverage for prescription medications.   Respiratory Medications Current: albuterol prn Tried in past: Symbicort (cost), Advair (cost) Patient reports adherence challenges  Respiratory Symptomatology  Triggers: exertion, SOB: depends on activity Chest pain: sometimes, with max exertion Coughing at night: 0 days/week Coughing with exertion: denies Frequency of rescue inhaler use: 2-3x per day  ER visits in last year: never  Hospitalizations in last year: never  Objective: No Known Allergies  Outpatient Encounter Medications as of 12/23/2019  Medication Sig Note  . albuterol (VENTOLIN HFA) 108 (90 Base) MCG/ACT inhaler Inhale 2 puffs into the lungs every 6 (six) hours as needed for wheezing  or shortness of breath.   . B-D 3CC LUER-LOK SYR 21GX1-1/2 21G X 1-1/2" 3 ML MISC USE TO INJECT DEPOTESTOSTERONE.   . budesonide-formoterol (SYMBICORT) 160-4.5 MCG/ACT inhaler Inhale 2 puffs into the lungs 2 (two) times daily.   [name] buPROPion (WELLBUTRIN XL) 300 MG 24 hr tablet TAKE 1 TABLET BY MOUTH DAILY *NEEDS OFFICE VISIT   . calcium carbonate 200 MG capsule Take 200 mg by mouth daily.    . cetirizine (ZYRTEC) 10 MG tablet Take 10 mg by mouth every morning.    [name] doxazosin (CARDURA) 8 MG tablet TAKE 1 TABLET BY MOUTH DAILY  . Take at night (Patient taking differently: every evening. TAKE 1 TABLET BY MOUTH DAILY  . Take at night) 06/24/2016: For prostate, not HBP.    [name] esomeprazole (NEXIUM) 40 MG capsule Take 1 capsule (40 mg total) by mouth 2 (two) times daily before a meal.   . etodolac (LODINE) 400 MG tablet Take 1 tablet (400 mg total) by mouth 2 (two) times daily as needed.   . fluticasone (FLONASE) 50 MCG/ACT nasal spray Place 2 sprays into both nostrils daily.   . furosemide (LASIX) 20 MG tablet TAKE 2 TABLETS BY MOUTH ONCE A DAY   . gabapentin (NEURONTIN) 300 MG capsule TAKE 1 TO 2 CAPSULES BY MOUTH AT BEDTIME   . glucosamine-chondroitin 500-400 MG tablet Take 1 tablet by mouth 2 (two) times daily.   [name] HYDROcodone-acetaminophen (NORCO) 5-325 MG tablet 1 po q d prn pain   . ipratropium (ATROVENT) 0.06 % nasal spray    . meloxicam (MOBIC) 15 MG tablet Take 0.5-1 tablets (7.5-15 mg total) by mouth daily as needed for pain.   . methocarbamol (ROBAXIN) 500 MG tablet Take 1 tablet (  500 mg total) by mouth every 6 (six) hours as needed for muscle spasms.   . Multiple Vitamin (MULTIVITAMIN) tablet Take 1 tablet by mouth daily.   [name] NEEDLE, DISP, 21 G 21G X 1-1/2" MISC Use to inject depotestosterone.   . potassium chloride SA (K-DUR,KLOR-CON) 20 MEQ tablet Take 1 tablet (20 mEq total) by mouth 2 (two) times daily.   [name] PROAIR HFA 108 (90 Base) MCG/ACT inhaler Inhale 1-2 puffs into the lungs every 6  (six) hours as needed for wheezing or shortness of breath.   . rosuvastatin (CRESTOR) 5 MG tablet Take 5 mg by mouth daily.   . sildenafil (REVATIO) 20 MG tablet Take 5 tablets daily as needed for erectile dysfunction.   . Syringe, Reusable, 3 ML MISC Use to inject depotestosterone.   . testosterone cypionate (DEPOTESTOTERONE CYPIONATE) 100 MG/ML injection Inject 1 mL (100 mg total) into the muscle every 7 (seven) days. For IM use only    Facility-Administered Encounter Medications as of 12/23/2019  Medication  . methylPREDNISolone acetate (DEPO-MEDROL) injection 40 mg     Immunization History  Administered Date(s) Administered  . Hepatitis A, Adult 07/31/2016  . Influenza Split 09/28/2012  . Influenza,inj,Quad PF,6+ Mos 08/03/2013, 07/21/2014, 09/13/2015, 07/23/2016, 07/14/2019  . Pneumococcal Conjugate-13 03/25/2015  . Pneumococcal Polysaccharide-23 04/29/2011, 06/28/2016  . Tdap 04/25/2013     PFTs PFT Results Latest Ref Rng & Units 11/25/2019  FVC-Pre L 3.17  FVC-Predicted Pre % 81  FVC-Post L 3.21  FVC-Predicted Post % 82  Pre FEV1/FVC % % 78  Post FEV1/FCV % % 81  FEV1-Pre L 2.48  FEV1-Predicted Pre % 88  FEV1-Post L 2.61  DLCO UNC% % 90  DLCO COR %Predicted % 112  TLC L 5.93  TLC % Predicted % 87  RV % Predicted % 108     Chest X-ray  08/13/2018 no pulmonary embolism.  Bronchial wall thickening.  Dependent atelectasis.  08/16/2019-mild dependent atelectasis, No evidence of interstitial lung disease.  11/07/2019-no pulmonary embolism or acute disease  Eosinophils Most recent blood eosinophil count was 0.21 cells/microL taken on 12/05/19.   PIFR  Inspiratory flow measured using the In-check DIAL G16 and was in range of 30-90 for use of Medium DPI device (Ellipta). Patient scored 90.  Assessment and Plan  1. Inhaler Optimization a. PIFR i. Inspiratory flow measured using the In-check DIAL G16 and was in range of 30-90 for use of Medium DPI device (Ellipta).  Patient scored 90. b. Optimal inhaler for patient would be HFA/DPI/pMDI considering his PIFR score. Prefer to keep patient on Symbicort to keep regimen as simple as possible. Will complete a patient assistance application today for AZ&me for future coverage of Symbicort. Considering patient is completely out of maintenance inhaler medication will supply patient with a sample of Breo in the interim time frame until he is able to receive Symbicort in the mail.  i. Patient was counseled on the purpose, proper use, and adverse effects of Symbicort and Breo inhalers.  Instructed patient to rinse mouth with water after using in order to prevent fungal infection.  Patient verbalized understanding. ii. Reviewed appropriate use of maintenance vs rescue inhalers.  Stressed importance of using maintenance inhaler daily and rescue inhaler only as needed.  Patient verbalized understanding. iii. Demonstrated proper inhaler technique using Symbicort and Ellipta demo inhaler.  Patient able to demonstrate proper inhaler technique using teach back method. Patient was given sample in office today (lot # [id number]; expiration date 02/08/20). Due to time constraints unable  to have patient administer his first dose of sample in office, however, patient did confirm he felt comfortable enough to administer first dose of Breo Ellipta at home.   2. Medication Reconciliation a. A drug regimen assessment was performed, including review of allergies, interactions, disease-state management, dosing and immunization history. Medications were reviewed with the patient, including name, instructions, indication, goals of therapy, potential side effects, importance of adherence, and safe use. b. Drug interaction(s):  i. Calcium carbonate and esomeprazole: calcium carbonate requires acidic environment for absorption which is not adeqaute using esomperazole. --> Consider using calcium citrate 300 mg (equivalent of calcium carbonate 200  mg).  3. Immunizations a. Patient is indicated for the shingles vaccinations. Due to time constraints unable to address at today's appt; will address at future follow up.  This appointment required 60 minutes of patient care (this includes precharting, chart review, review of results, face-to-face care, etc.).  Thank you for involving pharmacy to assist in providing this patient's care.   Drexel Iha, PharmD PGY2 Ambulatory Care Pharmacy Resident

## 2019-12-23 NOTE — Patient Instructions (Signed)
It was a pleasure seeing you in clinic today [name]  Today the plan is... 1. START using Breo Ellipta one puff daily. Remember to rinse your mouth out afterwards please! 2. We will apply to a patient assistance program to get Symbicort. When Symbicort comes in the mail you will stop using breo ellipta and start using Symbicort. Use Symbicort two puffs twice daily. Remember to rinse your mouth out afterwards please!  Please call the PharmD clinic at [phone] if you have any questions that you would like to speak with a pharmacist about [name], Museum/gallery conservator).

## 2019-12-26 ENCOUNTER — Telehealth: Payer: Self-pay | Admitting: Pharmacist

## 2019-12-26 NOTE — Telephone Encounter (Signed)
Called patient on 12/26/2019 at 9:13 AM and left HIPAA-compliant VM with instructions to call [facility] clinic back   Patient has not financial documents for AZ&me patient assistance application. Unable to send application until patient supplies pharmacy team with financial documents.  Drexel Iha, PharmD PGY2 Ambulatory Care Pharmacy Resident

## 2019-12-28 DIAGNOSIS — M542 Cervicalgia: Secondary | ICD-10-CM | POA: Diagnosis not present

## 2019-12-28 DIAGNOSIS — M48062 Spinal stenosis, lumbar region with neurogenic claudication: Secondary | ICD-10-CM | POA: Diagnosis not present

## 2019-12-29 ENCOUNTER — Ambulatory Visit: Payer: Medicare Other | Admitting: Gastroenterology

## 2019-12-30 NOTE — Telephone Encounter (Signed)
Appointment scheduled 12-15-2019

## 2020-01-05 DIAGNOSIS — M47812 Spondylosis without myelopathy or radiculopathy, cervical region: Secondary | ICD-10-CM | POA: Diagnosis not present

## 2020-01-30 DIAGNOSIS — L03312 Cellulitis of back [any part except buttock]: Secondary | ICD-10-CM | POA: Diagnosis not present

## 2020-01-30 DIAGNOSIS — M79604 Pain in right leg: Secondary | ICD-10-CM | POA: Diagnosis not present

## 2020-01-30 DIAGNOSIS — R600 Localized edema: Secondary | ICD-10-CM | POA: Diagnosis not present

## 2020-01-30 DIAGNOSIS — M79671 Pain in right foot: Secondary | ICD-10-CM | POA: Diagnosis not present

## 2020-01-30 DIAGNOSIS — M79674 Pain in right toe(s): Secondary | ICD-10-CM | POA: Diagnosis not present

## 2020-02-06 DIAGNOSIS — M5416 Radiculopathy, lumbar region: Secondary | ICD-10-CM | POA: Diagnosis not present

## 2020-02-28 DIAGNOSIS — R7309 Other abnormal glucose: Secondary | ICD-10-CM | POA: Diagnosis not present

## 2020-02-28 DIAGNOSIS — E782 Mixed hyperlipidemia: Secondary | ICD-10-CM | POA: Diagnosis not present

## 2020-02-28 DIAGNOSIS — S91101D Unspecified open wound of right great toe without damage to nail, subsequent encounter: Secondary | ICD-10-CM | POA: Diagnosis not present

## 2020-02-28 DIAGNOSIS — R790 Abnormal level of blood mineral: Secondary | ICD-10-CM | POA: Diagnosis not present

## 2020-02-28 DIAGNOSIS — N183 Chronic kidney disease, stage 3 unspecified: Secondary | ICD-10-CM | POA: Diagnosis not present

## 2020-03-06 ENCOUNTER — Ambulatory Visit (HOSPITAL_COMMUNITY)
Admission: RE | Admit: 2020-03-06 | Discharge: 2020-03-06 | Disposition: A | Discharge status: Home / Self Care | Payer: Medicare Other | Source: Ambulatory Visit | Attending: Vascular Surgery | Admitting: Vascular Surgery

## 2020-03-06 ENCOUNTER — Encounter: Payer: Self-pay | Admitting: Vascular Surgery

## 2020-03-06 ENCOUNTER — Ambulatory Visit (INDEPENDENT_AMBULATORY_CARE_PROVIDER_SITE_OTHER): Payer: Medicare Other | Admitting: Vascular Surgery

## 2020-03-06 ENCOUNTER — Other Ambulatory Visit: Payer: Self-pay

## 2020-03-06 VITALS — BP 154/88 | HR 84 | Temp 97.0°F | Resp 22 | Ht 69.0 in | Wt 251.6 lb

## 2020-03-06 DIAGNOSIS — M79606 Pain in leg, unspecified: Secondary | ICD-10-CM

## 2020-03-06 DIAGNOSIS — L97919 Non-pressure chronic ulcer of unspecified part of right lower leg with unspecified severity: Secondary | ICD-10-CM

## 2020-03-06 NOTE — Progress Notes (Signed)
Patient name: [name] MRN: [id number] DOB: 1941/01/04 Sex: male  REASON FOR CONSULT: Evaluate coolness to right leg with right great toe ulcer  HPI: [name] is a 79 y.o. male, with history of degenerative disc disease, hyperlipidemia, prediabetes, lower extremity venous insufficiency that presents for evaluation of cold right lower extremity with toe ulcer.  Patient states that his foot has felt cold for a while.  He has noted a ulcer on the tip of his right great toe approximately 3 weeks ago.  He thought this was initially showing some progress but since then not sure it is really healing.  He has tried antibiotics and antibiotic cream from his PCP.  He denies any previous lower extremity revascularizations.  He has had bilateral saphenous vein interventions according to him.  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  States he wears compression socks.  No significant pain in the foot.  Possibly some subjective numbness.  Past Medical History:  Diagnosis Date  . Allergy    takes Zyrtec daily  . Anxiety    takes Ativan daily as needed  . Arthritis   . Asthma   . Back pain   . BPH (benign prostatic hyperplasia)    takes doxazosin for  . 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 Depression    takes Wellbutrin daily  . Dyspnea on exertion    with exertion  . GERD (gastroesophageal reflux disease)    takes Nexium and Omeprazole daily  . History of kidney stones   . Hx of [facility] spotted fever childhood  . Hyperlipidemia   . Neuromuscular disorder ([place])   . Prediabetes   . Sleep apnea   . Swelling of lower extremity    right more than leg leg  . Umbilical hernia     Past Surgical History:  Procedure Laterality Date  . 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 BACK SURGERY  2003   L4, L5  . BACK SURGERY    . CATARACT EXTRACTION, BILATERAL    . ENDOVENOUS ABLATION SAPHENOUS VEIN W/ LASER Right  06-01-2014   EVLA right greater saphenous vein by Curt Jews MD   . epidural steroid injections        [facility] dr [place]  . hernia repair  2003  . HERNIA REPAIR     hernia inguinal x 2  . 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 UMBILICAL HERNIA REPAIR N/A 06/27/2016   Procedure: LAPAROSCOPIC ASSISTED REPAIR OF UMBILICAL HERINA WITH MESH;  Surgeon: Johnathan Hausen, MD;  Location: [facility];  Service: General;  Laterality: N/A;  . VASCULAR SURGERY  2015   right leg    Family History  Problem Relation Age of Onset  . Thyroid disease Mother   . Heart disease Father   . Diabetes Maternal Grandfather   . Colon cancer Neg Hx   . Esophageal cancer Neg Hx     SOCIAL HISTORY: Social History   Socioeconomic History  . Marital status: Single    Spouse name: Not on file  . Number of children: Not on file  . Years of education: Not on file  . Highest education level: Not on file  Occupational History  . Occupation: [employer]iques/[employer]  Employer: RETIRED  Tobacco Use  . Smoking status: Never Smoker  . Smokeless tobacco: Never Used  Substance and Sexual Activity  . Alcohol use: No  . Drug use: No  . Sexual activity: Yes    Birth control/protection: Condom  Other Topics Concern  . Not on file  Social History Narrative   ** Merged History Encounter **       Single. Education: Other.    Social Determinants of Health   Financial Resource Strain:   . Difficulty of Paying Living Expenses:   Food Insecurity:   . Worried About Charity fundraiser in the Last Year:   . Arboriculturist in the Last Year:   Transportation Needs:   . Film/video editor (Medical):   [name] Lack of Transportation (Non-Medical):   Physical Activity:   . Days of Exercise per Week:   . Minutes of Exercise per Session:   Stress:   . Feeling of Stress :   Social  Connections:   . Frequency of Communication with Friends and Family:   . Frequency of Social Gatherings with Friends and Family:   . Attends Religious Services:   . Active Member of Clubs or Organizations:   . Attends Archivist Meetings:   [name] Marital Status:   Intimate Partner Violence:   . Fear of Current or Ex-Partner:   . Emotionally Abused:   [name] Physically Abused:   . Sexually Abused:     No Known Allergies  Current Outpatient Medications  Medication Sig Dispense Refill  . albuterol (VENTOLIN HFA) 108 (90 Base) MCG/ACT inhaler Inhale 2 puffs into the lungs every 6 (six) hours as needed for wheezing or shortness of breath. 18 g 1  . B-D 3CC LUER-LOK SYR 21GX1-1/2 21G X 1-1/2" 3 ML MISC USE TO INJECT DEPOTESTOSTERONE. 20 each 0  . budesonide-formoterol (SYMBICORT) 160-4.5 MCG/ACT inhaler Inhale 2 puffs into the lungs 2 (two) times daily. 1 Inhaler 3  . buPROPion (WELLBUTRIN XL) 300 MG 24 hr tablet TAKE 1 TABLET BY MOUTH DAILY *NEEDS OFFICE VISIT 30 tablet 11  . calcium carbonate 200 MG capsule Take 200 mg by mouth daily.     . cetirizine (ZYRTEC) 10 MG tablet Take 10 mg by mouth every morning.     [name] doxazosin (CARDURA) 8 MG tablet TAKE 1 TABLET BY MOUTH DAILY  . Take at night (Patient taking differently: every evening. TAKE 1 TABLET BY MOUTH DAILY  . Take at night) 30 tablet 11  . esomeprazole (NEXIUM) 40 MG capsule Take 1 capsule (40 mg total) by mouth 2 (two) times daily before a meal. 60 capsule 3  . etodolac (LODINE) 400 MG tablet Take 1 tablet (400 mg total) by mouth 2 (two) times daily as needed. 60 tablet 6  . fluticasone (FLONASE) 50 MCG/ACT nasal spray Place 2 sprays into both nostrils daily. 16 g 3  . furosemide (LASIX) 20 MG tablet TAKE 2 TABLETS BY MOUTH ONCE A DAY 60 tablet 0  . gabapentin (NEURONTIN) 300 MG capsule TAKE 1 TO 2 CAPSULES BY MOUTH AT BEDTIME 60 capsule 0  . glucosamine-chondroitin 500-400 MG tablet Take 1 tablet by mouth 2 (two) times daily.    [name]  HYDROcodone-acetaminophen (NORCO) 5-325 MG tablet 1 po q d prn pain 45 tablet 0  . ipratropium (ATROVENT) 0.06 % nasal spray     . meloxicam (MOBIC) 15 MG tablet Take 0.5-1 tablets (7.5-15 mg total) by mouth daily as needed for pain. 90 tablet  3  . methocarbamol (ROBAXIN) 500 MG tablet Take 1 tablet (500 mg total) by mouth every 6 (six) hours as needed for muscle spasms. 60 tablet 3  . Multiple Vitamin (MULTIVITAMIN) tablet Take 1 tablet by mouth daily.    [name] NEEDLE, DISP, 21 G 21G X 1-1/2" MISC Use to inject depotestosterone. 50 each 0  . potassium chloride SA (K-DUR,KLOR-CON) 20 MEQ tablet Take 1 tablet (20 mEq total) by mouth 2 (two) times daily. 18 tablet 3  . PROAIR HFA 108 (90 Base) MCG/ACT inhaler Inhale 1-2 puffs into the lungs every 6 (six) hours as needed for wheezing or shortness of breath. 8 g 6  . rosuvastatin (CRESTOR) 5 MG tablet Take 5 mg by mouth daily.    . sildenafil (REVATIO) 20 MG tablet Take 5 tablets daily as needed for erectile dysfunction. 50 tablet 11  . Syringe, Reusable, 3 ML MISC Use to inject depotestosterone. 1 each 0  . testosterone cypionate (DEPOTESTOTERONE CYPIONATE) 100 MG/ML injection Inject 1 mL (100 mg total) into the muscle every 7 (seven) days. For IM use only 10 mL 1   Current Facility-Administered Medications  Medication Dose Route Frequency Provider Last Rate Last Admin  . methylPREDNISolone acetate (DEPO-MEDROL) injection 40 mg  40 mg Other Once Magnus Sinning, MD        REVIEW OF SYSTEMS:  [X]  denotes positive finding, [ ]  denotes negative finding Cardiac  Comments:  Chest pain or chest pressure:    Shortness of breath upon exertion:    Short of breath when lying flat:    Irregular heart rhythm:        Vascular    Pain in calf, thigh, or hip brought on by ambulation:    Pain in feet at night that wakes you up from your sleep:     Blood clot in your veins:    Leg swelling:         Pulmonary    Oxygen at home:    Productive cough:       Wheezing:         Neurologic    Sudden weakness in arms or legs:     Sudden numbness in arms or legs:     Sudden onset of difficulty speaking or slurred speech:    Temporary loss of vision in one eye:     Problems with dizziness:         Gastrointestinal    Blood in stool:     Vomited blood:         Genitourinary    Burning when urinating:     Blood in urine:        Psychiatric    Major depression:         Hematologic    Bleeding problems:    Problems with blood clotting too easily:        Skin    Rashes or ulcers:        Constitutional    Fever or chills:      PHYSICAL EXAM: Vitals:   03/06/20 1252  BP: (!) 154/88  Pulse: 84  Resp: (!) 22  Temp: (!) 97 F (36.1 C)  TempSrc: Temporal  SpO2: 95%  Weight: 251 lb 9.6 oz (114.1 kg)  Height: 5\' 9"  (1.753 m)    GENERAL: The patient is a well-nourished male, in no acute distress. The vital signs are documented above. CARDIAC: There is a regular rate and rhythm.  VASCULAR:  Palpable femoral pulses bilaterally Right  DP and PT palpable Ulcer on the tip of the right great toe pictured below Significant cyanosis to the right leg consistent with venous congestion and skin thickening PULMONARY: There is good air exchange bilaterally without wheezing or rales. ABDOMEN: Soft and non-tender with normal pitched bowel sounds.  MUSCULOSKELETAL: There are no major deformities or cyanosis. NEUROLOGIC: No focal weakness or paresthesias are detected. SKIN: There are no ulcers or rashes noted. PSYCHIATRIC: The patient has a normal affect.      DATA:   ABIs show triphasic waveform at the ankle with lABI >1  Assessment/Plan:  79 year old male presents with cool right lower extremity with distal ulceration on the right great toe as pictured above over the past 2 to 3 weeks.  I can actually palpate a pedal pulse on exam and he has triphasic waveforms at the ankle.  I discussed that I would expect he has enough perfusion to  heal this toe ulceration.  That being said, he is still very concerned about limb loss.  As a result I have offered arteriogram of the right lower extremity just to ensure he has adequate perfusion and this may all be related to small vessel disease.  I think it is reasonable to proceed with arteriogram since he feels the wound is not making any progress.  May ultimately require toe amputation.  I instructed him to start Betadine paint to the tip of the toe.  Feel most of the discoloration is related to his underlying venous disease but this ulcer is certainly in traditional arterial distribution.     Marty Heck, MD [facility] Office: [phone]

## 2020-03-06 NOTE — H&P (View-Only) (Signed)
Patient name: [name] MRN: [id number] DOB: [birth year] Sex: male  REASON FOR CONSULT: Evaluate coolness to right leg with right great toe ulcer  HPI: [name] is a 79 y.o. male, with history of degenerative disc disease, hyperlipidemia, prediabetes, lower extremity venous insufficiency that presents for evaluation of cold right lower extremity with toe ulcer.  Patient states that his foot has felt cold for a while.  He has noted a ulcer on the tip of his right great toe approximately 3 weeks ago.  He thought this was initially showing some progress but since then not sure it is really healing.  He has tried antibiotics and antibiotic cream from his PCP.  He denies any previous lower extremity revascularizations.  He has had bilateral saphenous vein interventions according to him.  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  States he wears compression socks.  No significant pain in the foot.  Possibly some subjective numbness.  Past Medical History:  Diagnosis Date  . Allergy    takes Zyrtec daily  . Anxiety    takes Ativan daily as needed  . Arthritis   . Asthma   . Back pain   . BPH (benign prostatic hyperplasia)    takes doxazosin for  . 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 Depression    takes Wellbutrin daily  . Dyspnea on exertion    with exertion  . GERD (gastroesophageal reflux disease)    takes Nexium and Omeprazole daily  . History of kidney stones   . 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 Hyperlipidemia   . Neuromuscular disorder ([place])   . Prediabetes   . Sleep apnea   . Swelling of lower extremity    right more than leg leg  . Umbilical hernia     Past Surgical History:  Procedure Laterality Date  . 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 BACK SURGERY  2003   L4, L5  . BACK SURGERY    . CATARACT EXTRACTION, BILATERAL    . ENDOVENOUS ABLATION SAPHENOUS VEIN W/ LASER Right  06-01-2014   EVLA right greater saphenous vein by Curt Jews MD   . epidural steroid injections        [facility]  . hernia repair  2003  . HERNIA REPAIR     hernia inguinal x 2  . 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 UMBILICAL HERNIA REPAIR N/A 06/27/2016   Procedure: LAPAROSCOPIC ASSISTED REPAIR OF UMBILICAL HERINA WITH MESH;  Surgeon: Johnathan Hausen, MD;  Location: [facility];  Service: General;  Laterality: N/A;  . VASCULAR SURGERY  2015   right leg    Family History  Problem Relation Age of Onset  . Thyroid disease Mother   . Heart disease Father   . Diabetes Maternal Grandfather   . Colon cancer Neg Hx   . Esophageal cancer Neg Hx     SOCIAL HISTORY: Social History   Socioeconomic History  . Marital status: Single    Spouse name: Not on file  . Number of children: Not on file  . Years of education: Not on file  . Highest education level: Not on file  Occupational History  . Occupation: [employer]iques/[employer]  Employer: RETIRED  Tobacco Use  . Smoking status: Never Smoker  . Smokeless tobacco: Never Used  Substance and Sexual Activity  . Alcohol use: No  . Drug use: No  . Sexual activity: Yes    Birth control/protection: Condom  Other Topics Concern  . Not on file  Social History Narrative   ** Merged History Encounter **       Single. Education: Other.    Social Determinants of Health   Financial Resource Strain:   . Difficulty of Paying Living Expenses:   Food Insecurity:   . Worried About Charity fundraiser in the Last Year:   . Arboriculturist in the Last Year:   Transportation Needs:   . Film/video editor (Medical):   [name] Lack of Transportation (Non-Medical):   Physical Activity:   . Days of Exercise per Week:   . Minutes of Exercise per Session:   Stress:   . Feeling of Stress :   Social  Connections:   . Frequency of Communication with Friends and Family:   . Frequency of Social Gatherings with Friends and Family:   . Attends Religious Services:   . Active Member of Clubs or Organizations:   . Attends Archivist Meetings:   [name] Marital Status:   Intimate Partner Violence:   . Fear of Current or Ex-Partner:   . Emotionally Abused:   [name] Physically Abused:   . Sexually Abused:     No Known Allergies  Current Outpatient Medications  Medication Sig Dispense Refill  . albuterol (VENTOLIN HFA) 108 (90 Base) MCG/ACT inhaler Inhale 2 puffs into the lungs every 6 (six) hours as needed for wheezing or shortness of breath. 18 g 1  . B-D 3CC LUER-LOK SYR 21GX1-1/2 21G X 1-1/2" 3 ML MISC USE TO INJECT DEPOTESTOSTERONE. 20 each 0  . budesonide-formoterol (SYMBICORT) 160-4.5 MCG/ACT inhaler Inhale 2 puffs into the lungs 2 (two) times daily. 1 Inhaler 3  . buPROPion (WELLBUTRIN XL) 300 MG 24 hr tablet TAKE 1 TABLET BY MOUTH DAILY *NEEDS OFFICE VISIT 30 tablet 11  . calcium carbonate 200 MG capsule Take 200 mg by mouth daily.     . cetirizine (ZYRTEC) 10 MG tablet Take 10 mg by mouth every morning.     [name] doxazosin (CARDURA) 8 MG tablet TAKE 1 TABLET BY MOUTH DAILY  . Take at night (Patient taking differently: every evening. TAKE 1 TABLET BY MOUTH DAILY  . Take at night) 30 tablet 11  . esomeprazole (NEXIUM) 40 MG capsule Take 1 capsule (40 mg total) by mouth 2 (two) times daily before a meal. 60 capsule 3  . etodolac (LODINE) 400 MG tablet Take 1 tablet (400 mg total) by mouth 2 (two) times daily as needed. 60 tablet 6  . fluticasone (FLONASE) 50 MCG/ACT nasal spray Place 2 sprays into both nostrils daily. 16 g 3  . furosemide (LASIX) 20 MG tablet TAKE 2 TABLETS BY MOUTH ONCE A DAY 60 tablet 0  . gabapentin (NEURONTIN) 300 MG capsule TAKE 1 TO 2 CAPSULES BY MOUTH AT BEDTIME 60 capsule 0  . glucosamine-chondroitin 500-400 MG tablet Take 1 tablet by mouth 2 (two) times daily.    [name]  HYDROcodone-acetaminophen (NORCO) 5-325 MG tablet 1 po q d prn pain 45 tablet 0  . ipratropium (ATROVENT) 0.06 % nasal spray     . meloxicam (MOBIC) 15 MG tablet Take 0.5-1 tablets (7.5-15 mg total) by mouth daily as needed for pain. 90 tablet  3  . methocarbamol (ROBAXIN) 500 MG tablet Take 1 tablet (500 mg total) by mouth every 6 (six) hours as needed for muscle spasms. 60 tablet 3  . Multiple Vitamin (MULTIVITAMIN) tablet Take 1 tablet by mouth daily.    [name] NEEDLE, DISP, 21 G 21G X 1-1/2" MISC Use to inject depotestosterone. 50 each 0  . potassium chloride SA (K-DUR,KLOR-CON) 20 MEQ tablet Take 1 tablet (20 mEq total) by mouth 2 (two) times daily. 18 tablet 3  . PROAIR HFA 108 (90 Base) MCG/ACT inhaler Inhale 1-2 puffs into the lungs every 6 (six) hours as needed for wheezing or shortness of breath. 8 g 6  . rosuvastatin (CRESTOR) 5 MG tablet Take 5 mg by mouth daily.    . sildenafil (REVATIO) 20 MG tablet Take 5 tablets daily as needed for erectile dysfunction. 50 tablet 11  . Syringe, Reusable, 3 ML MISC Use to inject depotestosterone. 1 each 0  . testosterone cypionate (DEPOTESTOTERONE CYPIONATE) 100 MG/ML injection Inject 1 mL (100 mg total) into the muscle every 7 (seven) days. For IM use only 10 mL 1   Current Facility-Administered Medications  Medication Dose Route Frequency Provider Last Rate Last Admin  . methylPREDNISolone acetate (DEPO-MEDROL) injection 40 mg  40 mg Other Once Magnus Sinning, MD        REVIEW OF SYSTEMS:  [X]  denotes positive finding, [ ]  denotes negative finding Cardiac  Comments:  Chest pain or chest pressure:    Shortness of breath upon exertion:    Short of breath when lying flat:    Irregular heart rhythm:        Vascular    Pain in calf, thigh, or hip brought on by ambulation:    Pain in feet at night that wakes you up from your sleep:     Blood clot in your veins:    Leg swelling:         Pulmonary    Oxygen at home:    Productive cough:       Wheezing:         Neurologic    Sudden weakness in arms or legs:     Sudden numbness in arms or legs:     Sudden onset of difficulty speaking or slurred speech:    Temporary loss of vision in one eye:     Problems with dizziness:         Gastrointestinal    Blood in stool:     Vomited blood:         Genitourinary    Burning when urinating:     Blood in urine:        Psychiatric    Major depression:         Hematologic    Bleeding problems:    Problems with blood clotting too easily:        Skin    Rashes or ulcers:        Constitutional    Fever or chills:      PHYSICAL EXAM: Vitals:   03/06/20 1252  BP: (!) 154/88  Pulse: 84  Resp: (!) 22  Temp: (!) 97 F (36.1 C)  TempSrc: Temporal  SpO2: 95%  Weight: 251 lb 9.6 oz (114.1 kg)  Height: 5\' 9"  (1.753 m)    GENERAL: The patient is a well-nourished male, in no acute distress. The vital signs are documented above. CARDIAC: There is a regular rate and rhythm.  VASCULAR:  Palpable femoral pulses bilaterally Right  DP and PT palpable Ulcer on the tip of the right great toe pictured below Significant cyanosis to the right leg consistent with venous congestion and skin thickening PULMONARY: There is good air exchange bilaterally without wheezing or rales. ABDOMEN: Soft and non-tender with normal pitched bowel sounds.  MUSCULOSKELETAL: There are no major deformities or cyanosis. NEUROLOGIC: No focal weakness or paresthesias are detected. SKIN: There are no ulcers or rashes noted. PSYCHIATRIC: The patient has a normal affect.      DATA:   ABIs show triphasic waveform at the ankle with lABI >1  Assessment/Plan:  79 year old male presents with cool right lower extremity with distal ulceration on the right great toe as pictured above over the past 2 to 3 weeks.  I can actually palpate a pedal pulse on exam and he has triphasic waveforms at the ankle.  I discussed that I would expect he has enough perfusion to  heal this toe ulceration.  That being said, he is still very concerned about limb loss.  As a result I have offered arteriogram of the right lower extremity just to ensure he has adequate perfusion and this may all be related to small vessel disease.  I think it is reasonable to proceed with arteriogram since he feels the wound is not making any progress.  May ultimately require toe amputation.  I instructed him to start Betadine paint to the tip of the toe.  Feel most of the discoloration is related to his underlying venous disease but this ulcer is certainly in traditional arterial distribution.     Marty Heck, MD [facility] Office: [phone]

## 2020-03-13 ENCOUNTER — Other Ambulatory Visit (HOSPITAL_COMMUNITY)
Admission: RE | Admit: 2020-03-13 | Discharge: 2020-03-13 | Disposition: A | Discharge status: Home / Self Care | Payer: Medicare Other | Source: Ambulatory Visit | Attending: Vascular Surgery | Admitting: Vascular Surgery

## 2020-03-13 ENCOUNTER — Other Ambulatory Visit (HOSPITAL_COMMUNITY): Payer: Medicare Other

## 2020-03-13 DIAGNOSIS — R030 Elevated blood-pressure reading, without diagnosis of hypertension: Secondary | ICD-10-CM | POA: Diagnosis not present

## 2020-03-13 DIAGNOSIS — Z20822 Contact with and (suspected) exposure to covid-19: Secondary | ICD-10-CM | POA: Insufficient documentation

## 2020-03-13 DIAGNOSIS — Z01812 Encounter for preprocedural laboratory examination: Secondary | ICD-10-CM | POA: Insufficient documentation

## 2020-03-13 DIAGNOSIS — M5416 Radiculopathy, lumbar region: Secondary | ICD-10-CM | POA: Diagnosis not present

## 2020-03-13 DIAGNOSIS — Z6835 Body mass index (BMI) 35.0-35.9, adult: Secondary | ICD-10-CM | POA: Diagnosis not present

## 2020-03-13 LAB — SARS CORONAVIRUS 2 (TAT 6-24 HRS): SARS Coronavirus 2: NEGATIVE

## 2020-03-14 ENCOUNTER — Ambulatory Visit (HOSPITAL_COMMUNITY)
Admission: RE | Admit: 2020-03-14 | Discharge: 2020-03-14 | Disposition: A | Discharge status: Home / Self Care | Payer: Medicare Other | Attending: Vascular Surgery | Admitting: Vascular Surgery

## 2020-03-14 ENCOUNTER — Encounter (HOSPITAL_COMMUNITY)
Admission: RE | Disposition: A | Discharge status: Home / Self Care | Payer: Self-pay | Source: Home / Self Care | Attending: Vascular Surgery

## 2020-03-14 ENCOUNTER — Other Ambulatory Visit: Payer: Self-pay

## 2020-03-14 DIAGNOSIS — M1990 Unspecified osteoarthritis, unspecified site: Secondary | ICD-10-CM | POA: Diagnosis not present

## 2020-03-14 DIAGNOSIS — N400 Enlarged prostate without lower urinary tract symptoms: Secondary | ICD-10-CM | POA: Diagnosis not present

## 2020-03-14 DIAGNOSIS — Z96652 Presence of left artificial knee joint: Secondary | ICD-10-CM | POA: Diagnosis not present

## 2020-03-14 DIAGNOSIS — Z89022 Acquired absence of left finger(s): Secondary | ICD-10-CM | POA: Insufficient documentation

## 2020-03-14 DIAGNOSIS — Z79899 Other long term (current) drug therapy: Secondary | ICD-10-CM | POA: Diagnosis not present

## 2020-03-14 DIAGNOSIS — E785 Hyperlipidemia, unspecified: Secondary | ICD-10-CM | POA: Diagnosis not present

## 2020-03-14 DIAGNOSIS — Z7951 Long term (current) use of inhaled steroids: Secondary | ICD-10-CM | POA: Insufficient documentation

## 2020-03-14 DIAGNOSIS — Z8349 Family history of other endocrine, nutritional and metabolic diseases: Secondary | ICD-10-CM | POA: Insufficient documentation

## 2020-03-14 DIAGNOSIS — G709 Myoneural disorder, unspecified: Secondary | ICD-10-CM | POA: Diagnosis not present

## 2020-03-14 DIAGNOSIS — L97519 Non-pressure chronic ulcer of other part of right foot with unspecified severity: Secondary | ICD-10-CM | POA: Insufficient documentation

## 2020-03-14 DIAGNOSIS — I70235 Atherosclerosis of native arteries of right leg with ulceration of other part of foot: Secondary | ICD-10-CM | POA: Insufficient documentation

## 2020-03-14 DIAGNOSIS — F419 Anxiety disorder, unspecified: Secondary | ICD-10-CM | POA: Diagnosis not present

## 2020-03-14 DIAGNOSIS — R7303 Prediabetes: Secondary | ICD-10-CM | POA: Diagnosis not present

## 2020-03-14 DIAGNOSIS — Z8249 Family history of ischemic heart disease and other diseases of the circulatory system: Secondary | ICD-10-CM | POA: Diagnosis not present

## 2020-03-14 DIAGNOSIS — G4730 Sleep apnea, unspecified: Secondary | ICD-10-CM | POA: Insufficient documentation

## 2020-03-14 HISTORY — PX: ABDOMINAL AORTOGRAM W/LOWER EXTREMITY: CATH118223

## 2020-03-14 LAB — POCT I-STAT, CHEM 8
BUN: 17 mg/dL (ref 8–23)
Calcium, Ion: 1.1 mmol/L — ABNORMAL LOW (ref 1.15–1.40)
Chloride: 100 mmol/L (ref 98–111)
Creatinine, Ser: 1.6 mg/dL — ABNORMAL HIGH (ref 0.61–1.24)
Glucose, Bld: 87 mg/dL (ref 70–99)
HCT: 47 % (ref 39.0–52.0)
Hemoglobin: 16 g/dL (ref 13.0–17.0)
Potassium: 4 mmol/L (ref 3.5–5.1)
Sodium: 139 mmol/L (ref 135–145)
TCO2: 30 mmol/L (ref 22–32)

## 2020-03-14 SURGERY — ABDOMINAL AORTOGRAM W/LOWER EXTREMITY
Anesthesia: LOCAL

## 2020-03-14 MED ORDER — SODIUM CHLORIDE 0.9% FLUSH: 3.0000 mL | Freq: Two times a day (BID) | INTRAVENOUS | Status: DC

## 2020-03-14 MED ORDER — MIDAZOLAM HCL 2 MG/2ML IJ SOLN
INTRAMUSCULAR | Status: DC | PRN
  Administered 2020-03-14 (×2): 1 mg via INTRAVENOUS

## 2020-03-14 MED ORDER — LIDOCAINE HCL (PF) 1 % IJ SOLN
INTRAMUSCULAR | Status: AC
  Filled 2020-03-14: qty 30

## 2020-03-14 MED ORDER — FENTANYL CITRATE (PF) 100 MCG/2ML IJ SOLN
INTRAMUSCULAR | Status: DC | PRN
  Administered 2020-03-14: 50 ug via INTRAVENOUS
  Administered 2020-03-14: 25 ug via INTRAVENOUS

## 2020-03-14 MED ORDER — SODIUM CHLORIDE 0.9% FLUSH: 3.0000 mL | INTRAVENOUS | Status: DC | PRN

## 2020-03-14 MED ORDER — ACETAMINOPHEN 325 MG PO TABS: 650.0000 mg | ORAL_TABLET | ORAL | Status: DC | PRN

## 2020-03-14 MED ORDER — FENTANYL CITRATE (PF) 100 MCG/2ML IJ SOLN
INTRAMUSCULAR | Status: AC
  Filled 2020-03-14: qty 2

## 2020-03-14 MED ORDER — HEPARIN (PORCINE) IN NACL 1000-0.9 UT/500ML-% IV SOLN
INTRAVENOUS | Status: AC
  Filled 2020-03-14: qty 1000

## 2020-03-14 MED ORDER — LABETALOL HCL 5 MG/ML IV SOLN: 10.0000 mg | INTRAVENOUS | Status: DC | PRN

## 2020-03-14 MED ORDER — SODIUM CHLORIDE 0.9 % IV SOLN: INTRAVENOUS | Status: DC

## 2020-03-14 MED ORDER — OXYCODONE HCL 5 MG PO TABS: 5.0000 mg | ORAL_TABLET | ORAL | Status: DC | PRN

## 2020-03-14 MED ORDER — HEPARIN (PORCINE) IN NACL 1000-0.9 UT/500ML-% IV SOLN
INTRAVENOUS | Status: DC | PRN
  Administered 2020-03-14: 500 mL

## 2020-03-14 MED ORDER — MIDAZOLAM HCL 2 MG/2ML IJ SOLN
INTRAMUSCULAR | Status: AC
  Filled 2020-03-14: qty 2

## 2020-03-14 MED ORDER — HYDRALAZINE HCL 20 MG/ML IJ SOLN: 5.0000 mg | INTRAMUSCULAR | Status: DC | PRN

## 2020-03-14 MED ORDER — SODIUM CHLORIDE 0.9 % IV SOLN: 250.0000 mL | INTRAVENOUS | Status: DC | PRN

## 2020-03-14 MED ORDER — HYDRALAZINE HCL 20 MG/ML IJ SOLN
INTRAMUSCULAR | Status: DC | PRN
  Administered 2020-03-14: 10 mg via INTRAVENOUS

## 2020-03-14 MED ORDER — SODIUM CHLORIDE 0.9 % WEIGHT BASED INFUSION: 1.0000 mL/kg/h | INTRAVENOUS | Status: DC

## 2020-03-14 MED ORDER — LIDOCAINE HCL (PF) 1 % IJ SOLN
INTRAMUSCULAR | Status: DC | PRN
  Administered 2020-03-14: 30 mL

## 2020-03-14 MED ORDER — ONDANSETRON HCL 4 MG/2ML IJ SOLN: 4.0000 mg | Freq: Four times a day (QID) | INTRAMUSCULAR | Status: DC | PRN

## 2020-03-14 MED ORDER — IODIXANOL 320 MG/ML IV SOLN
INTRAVENOUS | Status: DC | PRN
  Administered 2020-03-14: 12:00:00 15 mL via INTRA_ARTERIAL

## 2020-03-14 MED ORDER — HYDRALAZINE HCL 20 MG/ML IJ SOLN
INTRAMUSCULAR | Status: AC
  Filled 2020-03-14: qty 1

## 2020-03-14 SURGICAL SUPPLY — 17 items
CATH NAVICROSS ANG 65CM (CATHETERS) IMPLANT
CATH OMNI FLUSH 5F 65CM (CATHETERS) ×1 IMPLANT
CATHETER NAVICROSS ANG 65CM (CATHETERS) ×2
CLOSURE MYNX CONTROL 5F (Vascular Products) ×1 IMPLANT
DEVICE TORQUE H2O (MISCELLANEOUS) ×1 IMPLANT
FILTER CO2 0.2 MICRON (VASCULAR PRODUCTS) ×1 IMPLANT
GUIDEWIRE ANGLED .035X150CM (WIRE) ×1 IMPLANT
KIT MICROPUNCTURE NIT STIFF (SHEATH) ×1 IMPLANT
KIT PV (KITS) ×2 IMPLANT
RESERVOIR CO2 (VASCULAR PRODUCTS) ×2 IMPLANT
SET FLUSH CO2 (MISCELLANEOUS) ×1 IMPLANT
SHEATH PINNACLE 5F 10CM (SHEATH) ×1 IMPLANT
SHEATH PROBE COVER 6X72 (BAG) ×1 IMPLANT
SYR MEDRAD MARK 7 150ML (SYRINGE) ×2 IMPLANT
TRANSDUCER W/STOPCOCK (MISCELLANEOUS) ×2 IMPLANT
TRAY PV CATH (CUSTOM PROCEDURE TRAY) ×2 IMPLANT
WIRE BENTSON .035X145CM (WIRE) ×1 IMPLANT

## 2020-03-14 NOTE — Op Note (Signed)
    Patient name: [name] MRN: [id number] DOB: 09-25-1941 Sex: male  03/14/2020 Pre-operative Diagnosis: Right toe ulcer Post-operative diagnosis:  Same Surgeon:  [name] Procedure Performed:  1.  Ultrasound-guided access, left femoral artery  2.  Abdominal aortogram  3.  Bilateral lower extremity runoff  4.  Second-order catheterization  5.  Conscious sedation, 37 minutes  6.  Closure device, Mynx    Indications: The patient has a right great toe wound and comes in today for arterial evaluation.  Procedure:  The patient was identified in the holding area and taken to room 8.  The patient was then placed supine on the table and prepped and draped in the usual sterile fashion.  A time out was called.  Conscious sedation was administered with the use of IV fentanyl and Versed under continuous physician and nurse monitoring.  Heart rate, blood pressure, and oxygen saturation were continuously monitored.  Total sedation time was 37 minutes.  Ultrasound was used to evaluate the left common femoral artery.  It was patent .  A digital ultrasound image was acquired.  A micropuncture needle was used to access the left common femoral artery under ultrasound guidance.  An 018 wire was advanced without resistance and a micropuncture sheath was placed.  The 018 wire was removed and a benson wire was placed.  The micropuncture sheath was exchanged for a 5 french sheath.  An omniflush catheter was advanced over the wire to the level of L-1.  An abdominal angiogram with CO2 was obtained.  Next the cath was pulled down to the aortic bifurcation, and images of the pelvis and both legs were obtained until I could no longer visualize things with CO2.  At this point, across the aortic bifurcation with a Nava cross catheter and a Glidewire, and place a catheter in the right common femoral artery and performed additional images of the right leg.  Injections through the sheath on the left were obtained to get  additional imaging of the left leg.  Findings:   Aortogram: No significant renal artery stenosis.  The infrarenal abdominal aorta is widely patent.  Bilateral common and external iliac arteries are widely patent.  Right Lower Extremity: The right common femoral profundofemoral and superficial femoral artery are widely patent.  Popliteal artery is widely patent.  There is three-vessel runoff.  There is an intact pedal arch.  Left Lower Extremity: Left common femoral profundofemoral and superficial femoral artery widely patent.  The popliteal artery is widely patent.  There is three-vessel runoff.  Intervention: None  Impression:  #1  No significant aortoiliac occlusive disease  #2  No significant outflow disease  #3  Three-vessel runoff bilaterally    *  V. [name], M.D., [facility] [place] Office: [phone] Pager:  [phone]

## 2020-03-14 NOTE — Discharge Instructions (Signed)
Femoral Site Care This sheet gives you information about how to care for yourself after your procedure. Your health care provider may also give you more specific instructions. If you have problems or questions, contact your health care provider. What can I expect after the procedure? After the procedure, it is common to have:  Bruising that usually fades within 1-2 weeks.  Tenderness at the site. Follow these instructions at home: Wound care  Follow instructions from your health care provider about how to take care of your insertion site. Make sure you: ? Wash your hands with soap and water before you change your bandage (dressing). If soap and water are not available, use hand sanitizer. ? Change your dressing as told by your health care provider. ? Leave stitches (sutures), skin glue, or adhesive strips in place. These skin closures may need to stay in place for 2 weeks or longer. If adhesive strip edges start to loosen and curl up, you may trim the loose edges. Do not remove adhesive strips completely unless your health care provider tells you to do that.  Do not take baths, swim, or use a hot tub until your health care provider approves.  You may shower 24-48 hours after the procedure or as told by your health care provider. ? Gently wash the site with plain soap and water. ? Pat the area dry with a clean towel. ? Do not rub the site. This may cause bleeding.  Do not apply powder or lotion to the site. Keep the site clean and dry.  Check your femoral site every day for signs of infection. Check for: ? Redness, swelling, or pain. ? Fluid or blood. ? Warmth. ? Pus or a bad smell. Activity  For the first 2-3 days after your procedure, or as long as directed: ? Avoid climbing stairs as much as possible. ? Do not squat.  Do not lift anything that is heavier than 10 lb (4.5 kg), or the limit that you are told, until your health care provider says that it is safe.  Rest as  directed. ? Avoid sitting for a long time without moving. Get up to take short walks every 1-2 hours.  Do not drive for 24 hours if you were given a medicine to help you relax (sedative). General instructions  Take over-the-counter and prescription medicines only as told by your health care provider.  Keep all follow-up visits as told by your health care provider. This is important. Contact a health care provider if you have:  A fever or chills.  You have redness, swelling, or pain around your insertion site. Get help right away if:  The catheter insertion area swells very fast.  You pass out.  You suddenly start to sweat or your skin gets clammy.  The catheter insertion area is bleeding, and the bleeding does not stop when you hold steady pressure on the area.  The area near or just beyond the catheter insertion site becomes pale, cool, tingly, or numb. These symptoms may represent a serious problem that is an emergency. Do not wait to see if the symptoms will go away. Get medical help right away. Call your local emergency services (911 in the [place] Do not drive yourself to the hospital. Summary  After the procedure, it is common to have bruising that usually fades within 1-2 weeks.  Check your femoral site every day for signs of infection.  Do not lift anything that is heavier than 10 lb (4.5 kg), or the   limit that you are told, until your health care provider says that it is safe. This information is not intended to replace advice given to you by your health care provider. Make sure you discuss any questions you have with your health care provider. Document Revised: 11/09/2017 Document Reviewed: 11/09/2017 Elsevier Patient Education  2020 [employer]  

## 2020-03-14 NOTE — Interval H&P Note (Signed)
History and Physical Interval Note:  03/14/2020 10:41 AM  [name]  has presented today for surgery, with the diagnosis of pad.  The various methods of treatment have been discussed with the patient and family. After consideration of risks, benefits and other options for treatment, the patient has consented to  Procedure(s): ABDOMINAL AORTOGRAM W/LOWER EXTREMITY (N/A) as a surgical intervention.  The patient's history has been reviewed, patient examined, no change in status, stable for surgery.  I have reviewed the patient's chart and labs.  Questions were answered to the patient's satisfaction.     [name]

## 2020-03-26 ENCOUNTER — Ambulatory Visit (INDEPENDENT_AMBULATORY_CARE_PROVIDER_SITE_OTHER): Payer: Medicare Other | Admitting: Physician Assistant

## 2020-03-26 ENCOUNTER — Ambulatory Visit (INDEPENDENT_AMBULATORY_CARE_PROVIDER_SITE_OTHER): Payer: Medicare Other

## 2020-03-26 ENCOUNTER — Other Ambulatory Visit: Payer: Self-pay

## 2020-03-26 ENCOUNTER — Encounter: Payer: Self-pay | Admitting: Orthopedic Surgery

## 2020-03-26 VITALS — Ht 69.0 in | Wt 245.0 lb

## 2020-03-26 DIAGNOSIS — M25551 Pain in right hip: Secondary | ICD-10-CM

## 2020-03-26 MED ORDER — PREDNISONE 10 MG PO TABS: 20.0000 mg | ORAL_TABLET | Freq: Every day | ORAL | 1 refills | Status: DC

## 2020-03-26 NOTE — Progress Notes (Signed)
Office Visit Note   Patient: [name]           Date of Birth: 07/03/1941           MRN: [id number] Visit Date: 03/26/2020              Requested by: Cari Caraway, [place],  [place] [address] PCP: Cari Caraway, MD  Chief Complaint  Patient presents with  . Right Hip - Pain      HPI: This is a pleasant gentleman with a long history of lower back pain.  He is seen by Dr. Ellene Route who has recommended surgery.  He had an MRI done in December which showed disc protrusion as well as stenosis.  He comes in today saying that he has pain in the lower back which is exaggerated only when he goes to pick something up.  He feels it is on the bone.  When he goes to straighten up he can feel pain that is significant enough to him that he has feelings like his back or his hip is locked up.  He denies any groin pain he denies any new pain other than this.  He admits he has been lifting a lot of heavy things regarding his work.  If he does not lift things the pain seems to go away  Assessment & Plan: Visit Diagnoses:  1. Pain in right hip     Plan: Reviewed x-rays and exam with Dr. Sharol Given.  This right-sided pain he is Dr. Sharol Given feels is still secondary to his back.  I will start him on a short course of prednisone orally to see if this helps him.  Otherwise I would refer him back to Dr. Ellene Route to further evaluate these new symptoms He will take 20 mg of prednisone and then wean down on this as his symptoms resolved.  He knows to take this with food.  I have asked that he not take excessive doses of other anti-inflammatories while he is taking prednisone Follow-Up Instructions: No follow-ups on file.   Ortho Exam  Patient is alert, oriented, no adenopathy, well-dressed, normal affect, normal respiratory effort. Focused examination demonstrates no focal tenderness.  He can reproduce the symptoms when he bends over and lifts up it is at the right lower back.  Unchanged distal  exam no new focal weakness or paresthesias.  Imaging: No results found. No images are attached to the encounter.  Labs: Lab Results  Component Value Date   HGBA1C 5.8 03/11/2016   HGBA1C 5.5 11/10/2015   HGBA1C 5.7 (H) 08/27/2015   ESRSEDRATE 7 11/10/2015   LABURIC 7.0 01/15/2018   LABURIC 7.3 11/10/2015   REPTSTATUS 03/23/2015 FINAL 03/21/2015   GRAMSTAIN No WBC Seen 03/13/2014   GRAMSTAIN No Squamous Epithelial Cells Seen 03/13/2014   GRAMSTAIN Rare Gram Positive Cocci In Pairs 03/13/2014   CULT  03/21/2015    INSIGNIFICANT GROWTH Performed at [facility] 04/05/2015     Lab Results  Component Value Date   ALBUMIN 4.0 08/05/2019   ALBUMIN 4.1 05/19/2016   ALBUMIN 4.2 03/11/2016   LABURIC 7.0 01/15/2018   LABURIC 7.3 11/10/2015    No results found for: MG No results found for: VD25OH  No results found for: PREALBUMIN CBC EXTENDED Latest Ref Rng & Units 03/14/2020 12/05/2019 08/05/2019  WBC 4.0 - 10.5 K/uL - 6.0 5.6  RBC 4.22 - 5.81 Mil/uL - 5.01 4.87  HGB 13.0 -  17.0 g/dL 16.0 15.4 15.2  HCT 39.0 - 52.0 % 47.0 46.5 44.6  PLT 150.0 - 400.0 K/uL - 164.0 156.0  NEUTROABS 1.4 - 7.7 K/uL - 4.3 3.7  LYMPHSABS 0.7 - 4.0 K/uL - 0.9 1.1     Body mass index is 36.18 kg/m.  Orders:  Orders Placed This Encounter  Procedures  . XR HIP UNILAT W OR W/O PELVIS 2-3 VIEWS RIGHT   Meds ordered this encounter  Medications  . predniSONE (DELTASONE) 10 MG tablet    Sig: Take 2 tablets (20 mg total) by mouth daily with breakfast.    Dispense:  60 tablet    Refill:  1     Procedures: No procedures performed  Clinical Data: No additional findings.  ROS:  All other systems negative, except as noted in the HPI. Review of Systems  Objective: Vital Signs: Ht 5\' 9"  (1.753 m)   Wt 245 lb (111.1 kg)   BMI 36.18 kg/m   Specialty Comments:  No specialty comments available.  PMFS History: Patient Active Problem List   Diagnosis Date  Noted  . Spinal stenosis of lumbar region with neurogenic claudication 01/15/2017  . Spondylolisthesis of lumbar region 01/15/2017  . Claw toe, acquired, left 01/06/2017  . Achilles tendon contracture, bilateral 01/06/2017  . Pain in metatarsus of both feet 01/06/2017  . Tubular adenoma of colon 12/29/2016  . Basal cell carcinoma 08/19/2016  . Umbilical hernia s/p lap repair with mesh 06/27/2016 06/27/2016  . Degenerative arthritis of knee 03/22/2015  . Dyspnea on exertion 01/31/2015  . Lumbar radiculopathy 12/12/2014  . Facet hypertrophy of lumbar region 12/12/2014  . Left knee pain 12/12/2014  . Ulcer of lower limb ([place]) 01/10/2014  . Varicose veins of lower extremities with other complications [id number]  . 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 Renal insufficiency 12/21/2013  . Swelling in head/neck 07/28/2012  . Venous (peripheral) insufficiency 05/25/2012  . 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 Pain in limb 02/12/2012  . Hyperlipidemia 02/28/2008  . Allergic rhinitis 02/28/2008  . Sleep apnea 02/28/2008   Past Medical History:  Diagnosis Date  . Allergy    takes Zyrtec daily  . Anxiety    takes Ativan daily as needed  . Arthritis   . Asthma   . Back pain   . BPH (benign prostatic hyperplasia)    takes doxazosin for  . 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 Depression    takes Wellbutrin daily  . Dyspnea on exertion    with exertion  . GERD (gastroesophageal reflux disease)    takes Nexium and Omeprazole daily  . History of kidney stones   . Hx of [facility] spotted fever childhood  . Hyperlipidemia   . Neuromuscular disorder ([place])   . Prediabetes   . Sleep apnea   . Swelling of lower extremity    right more than leg leg  . Umbilical hernia     Family History  Problem Relation Age of Onset  . Thyroid disease Mother   . Heart disease Father   . Diabetes Maternal Grandfather   . Colon cancer Neg Hx   .  Esophageal cancer Neg Hx     Past Surgical History:  Procedure Laterality Date  . ABDOMINAL AORTOGRAM W/LOWER EXTREMITY N/A 03/14/2020   Procedure: ABDOMINAL AORTOGRAM W/LOWER EXTREMITY;  Surgeon: Serafina Mitchell, MD;  Location: [facility];  Service: Vascular;  Laterality: N/A;  . AMPUTATION FINGER  lft hand middle and second fingers  . BACK SURGERY  2003   L4, L5  . BACK SURGERY    . CATARACT EXTRACTION, BILATERAL    . ENDOVENOUS ABLATION SAPHENOUS VEIN W/ LASER Right 06-01-2014   EVLA right greater saphenous vein by Curt Jews MD   . epidural steroid injections        [facility] dr [place]  . hernia repair  2003  . HERNIA REPAIR     hernia inguinal x 2  . TOTAL KNEE ARTHROPLASTY Left 03/22/2015   Procedure: TOTAL KNEE ARTHROPLASTY;  Surgeon: Meredith Pel, MD;  Location: [facility];  Service: Orthopedics;  Laterality: Left;  . UMBILICAL HERNIA REPAIR N/A 06/27/2016   Procedure: LAPAROSCOPIC ASSISTED REPAIR OF UMBILICAL HERINA WITH MESH;  Surgeon: Johnathan Hausen, MD;  Location: [facility];  Service: General;  Laterality: N/A;  . VASCULAR SURGERY  2015   right leg   Social History   Occupational History  . 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 Smoking status: Never Smoker  . Smokeless tobacco: Never Used  Substance and Sexual Activity  . Alcohol use: No  . Drug use: No  . Sexual activity: Yes    Birth control/protection: Condom

## 2020-04-05 DIAGNOSIS — M5416 Radiculopathy, lumbar region: Secondary | ICD-10-CM | POA: Diagnosis not present

## 2020-04-10 DIAGNOSIS — R3915 Urgency of urination: Secondary | ICD-10-CM | POA: Diagnosis not present

## 2020-04-10 DIAGNOSIS — N401 Enlarged prostate with lower urinary tract symptoms: Secondary | ICD-10-CM | POA: Diagnosis not present

## 2020-04-10 DIAGNOSIS — E291 Testicular hypofunction: Secondary | ICD-10-CM | POA: Diagnosis not present

## 2020-04-11 ENCOUNTER — Telehealth: Payer: Self-pay | Admitting: Family Medicine

## 2020-04-11 NOTE — Telephone Encounter (Signed)
Do you still have this, so that you could refax?

## 2020-04-11 NOTE — Telephone Encounter (Signed)
Rep from [facility] called asked if form has been signed and faxed back from Dr Junius Roads. I advised Rep the form will be faxed back as soon as possible.

## 2020-04-11 NOTE — Telephone Encounter (Signed)
Rep from [facility] called. Says the RX was cut off. Would like for the RX to be re faxed

## 2020-04-12 NOTE — Telephone Encounter (Signed)
Terri  I re-faxed  Mr. Kienitz form again per your request. Thx Judie Petit

## 2020-04-13 ENCOUNTER — Encounter: Payer: Self-pay | Admitting: Physical Medicine and Rehabilitation

## 2020-04-13 NOTE — Procedures (Signed)
Lumbar Epidural Steroid Injection - Interlaminar Approach with Fluoroscopic Guidance  Patient: [name]      Date of Birth: 1941/02/28 MRN: [id number] PCP: Cari Caraway, MD      Visit Date: 11/10/2019   Universal Protocol:     Consent Given By: the patient  Position: PRONE  Additional Comments: Vital signs were monitored before and after the procedure. Patient was prepped and draped in the usual sterile fashion. The correct patient, procedure, and site was verified.   Injection Procedure Details:  Procedure Site One Meds Administered:  Meds ordered this encounter  Medications  . methylPREDNISolone acetate (DEPO-MEDROL) injection 40 mg     Laterality: Right  Location/Site:  L5-S1  Needle size: 20 G  Needle type: Tuohy  Needle Placement: Paramedian epidural  Findings:   -Comments: Excellent flow of contrast into the epidural space.  Procedure Details: Using a paramedian approach from the side mentioned above, the region overlying the inferior lamina was localized under fluoroscopic visualization and the soft tissues overlying this structure were infiltrated with 4 ml. of 1% Lidocaine without Epinephrine. The Tuohy needle was inserted into the epidural space using a paramedian approach.   The epidural space was localized using loss of resistance along with lateral and bi-planar fluoroscopic views.  After negative aspirate for air, blood, and CSF, a 2 ml. volume of Isovue-250 was injected into the epidural space and the flow of contrast was observed. Radiographs were obtained for documentation purposes.    The injectate was administered into the level noted above.   Additional Comments:  The patient tolerated the procedure well Dressing: 2 x 2 sterile gauze and Band-Aid    Post-procedure details: Patient was observed during the procedure. Post-procedure instructions were reviewed.  Patient left the clinic in stable condition.

## 2020-04-13 NOTE — Progress Notes (Addendum)
[name] - 79 y.o. male MRN [id number]  Date of birth: 07-12-1941  Office Visit Note: Visit Date: 10/05/2019 PCP: Cari Caraway, MD Referred by: Cari Caraway, MD  Subjective: Chief Complaint  Patient presents with  . Lower Back - Pain  . Right Leg - Pain, Numbness  . Right Foot - Numbness   HPI: [name] is a 79 y.o. male who comes in today For reevaluation and management of chronic worsening severe low back pain with bilateral leg pain but now complaining of in particular right L5 radicular pain into the foot with weakness.  The last time we saw the patient was in May 2020 and we completed facet joint blocks because of mainly low back pain.  His history from a spine standpoint as he has an MRI from 2018 which is again reviewed today and reviewed with the patient.  The cyst moderate stenosis at L2-3 and L3-4 with severe stenosis at L4-5 with listhesis at this level.  He is actually seen Dr. Rodell Perna and Dr. Jeneen Rinks neck in the office concerning his spine condition.  He has declined surgery do to the fact that he really does not want surgery and he would get intermittent relief with epidural injection.  He is also followed in the office by Dr. Eunice Blase and I believe he is seeing Dr. Sharol Given as well.  He comes in today with history of 6 weeks of progressive right leg pain with numbness into the right foot in a predominantly L5 distribution with weakness with dorsiflexion but good plantar flexion.  He does have symptoms into the great toe.  No real symptoms on the left.  Worse with standing and ambulating.  Any activity seems to make it worse.  Hydrocodone has been helping with his pain when it is really bad.  He rates his symptoms as a 7 out of 10 at this point.  He is commenting that he is dragging his foot at times and tripping a little bit when he walks.  I had a long discussion with him today about severe stenosis of the lumbar spine and radicular weakness.  He describes no  change in bowel or bladder function although it has been an issue with him because he is on diuretics.  He did not have any trauma he has not had any unintended weight loss etc.  He has had significant weakness but not progressive weakness.  He has had no again bladder retention or anything severe.  Review of Systems  Musculoskeletal: Positive for back pain and joint pain.  Neurological: Positive for tingling and focal weakness.  All other systems reviewed and are negative.  Otherwise per HPI.  Assessment & Plan: Visit Diagnoses:  1. Spinal stenosis of lumbar region with neurogenic claudication   2. Lumbar radiculopathy   3. Foot drop, right   4. Paresthesia of skin     Plan: Findings:  Chronic pain syndrome and chronic history of lumbar stenosis and lumbar spondylolisthesis particular at L4-5 with severe narrowing.  Has been evaluated in the past by spine surgeons in the office Dr. Basil Dess Dr. Rodell Perna.  He now has right radicular pain and focal weakness.  Is not progressive and the patient does want an injection but I have also told him about the risk associated with nerve compression at this level.  I have cautioned him on red flag symptoms that should they occur he needs to go straight to the emergency room.  He understands this  and really just wants an injection to see if it will help.  I think he would do well with a AFO for the right foot and I tried to explain that to him at length.  Depending on how the injection goes I will be very quick to repeat MRI of the lumbar spine.  I do think he needs surgical consultation whether it is once again with Dr. Louanne Skye or one of the neurosurgeons.  He has commented that he has had recommendations for Dr. Ellene Route.    Meds & Orders: No orders of the defined types were placed in this encounter.   Orders Placed This Encounter  Procedures  . XR C-ARM NO REPORT    Follow-up: Return for MRI of the lumbar spine depending on relief with injection.    Procedures: Lumbosacral Transforaminal Epidural Steroid Injection - Sub-Pedicular Approach with Fluoroscopic Guidance  Patient: [name]      Date of Birth: [birth year] MRN: [id number] PCP: Cari Caraway, MD      Visit Date: 10/05/2019   Universal Protocol:    Date/Time: 10/05/2019  Consent Given By: the patient  Position: PRONE  Additional Comments: Vital signs were monitored before and after the procedure. Patient was prepped and draped in the usual sterile fashion. The correct patient, procedure, and site was verified.   Injection Procedure Details:  Procedure Site One Meds Administered: No orders of the defined types were placed in this encounter.   Laterality: Left  Location/Site:  L4-L5  Needle size: 60 G  Needle type: Spinal  Needle Placement: Transforaminal  Findings:    -Comments: Excellent flow of contrast along the nerve and into the epidural space.  Procedure Details: After squaring off the end-plates to get a true AP view, the C-arm was positioned so that an oblique view of the foramen as noted above was visualized. The target area is just inferior to the "nose of the scotty dog" or sub pedicular. The soft tissues overlying this structure were infiltrated with 2-3 ml. of 1% Lidocaine without Epinephrine.  The spinal needle was inserted toward the target using a "trajectory" view along the fluoroscope beam.  Under AP and lateral visualization, the needle was advanced so it did not puncture dura and was located close the 6 O'Clock position of the pedical in AP tracterory. Biplanar projections were used to confirm position. Aspiration was confirmed to be negative for CSF and/or blood. A 1-2 ml. volume of Isovue-250 was injected and flow of contrast was noted at each level. Radiographs were obtained for documentation purposes.   After attaining the desired flow of contrast documented above, a 0.5 to 1.0 ml test dose of 0.25% Marcaine was injected into  each respective transforaminal space.  The patient was observed for 90 seconds post injection.  After no sensory deficits were reported, and normal lower extremity motor function was noted,   the above injectate was administered so that equal amounts of the injectate were placed at each foramen (level) into the transforaminal epidural space.   Additional Comments:  The patient tolerated the procedure well Dressing: 2 x 2 sterile gauze and Band-Aid    Post-procedure details: Patient was observed during the procedure. Post-procedure instructions were reviewed.  Patient left the clinic in stable condition.    Clinical History: CLINICAL DATA:  Low back pain with bilateral sciatica.  EXAM: MRI LUMBAR SPINE WITHOUT CONTRAST  TECHNIQUE: Multiplanar, multisequence MR imaging of the lumbar spine was performed. No intravenous contrast was administered.  COMPARISON:  Lumbar  spine MRI 03/07/2017  FINDINGS: Segmentation:  Standard.  Alignment: Grade 1 retrolisthesis at L3-L4. Grade 1 anterolisthesis at L4-L5.  Vertebrae: Superior L2 and inferior L1 endplate abnormalities are unchanged .  Conus medullaris: Extends to the L1 level and appears normal.  Paraspinal and other soft tissues: Negative.  Disc levels:  Above the T12 level, there is no disc herniation, spinal canal stenosis or neuroforaminal stenosis.  T12-L1: Normal disc space and facets. No spinal canal or neuroforaminal stenosis.  L1-L2: Disc space narrowing with posterior disc osteophyte complex, unchanged. Mild spinal canal stenosis. Severe bilateral neural foraminal stenosis, unchanged.  L2-L3: Diffuse disc bulge, worsened from the prior study, with bilateral moderate facet hypertrophy, results in moderate spinal canal stenosis. Moderate right and left neural foraminal stenosis, unchanged.  L3-L4: Worsened, moderate spinal canal stenosis due to combination of disc bulge and facet hypertrophy.  Mild right and moderate left neural foraminal stenosis is unchanged.  L4-L5: Grade 1 anterolisthesis with pseudo disc bulge and severe bilateral facet hypertrophy, unchanged. Severe spinal canal stenosis and severe bilateral neural foraminal stenosis is unchanged.  L5-S1: Disc space narrowing with small bulge and moderate bilateral facet hypertrophy, unchanged. Moderate spinal canal stenosis and bilateral neural foraminal stenosis, unchanged.  Visualized sacrum: Normal.  IMPRESSION: 1. Worsening of degenerative disc disease at L2-L3 and L3-L4 with moderate spinal canal stenosis at both levels and moderate bilateral L2 and left L3 neural foraminal stenosis. 2. Severe spinal canal stenosis and bilateral neural foraminal stenosis at L4-L5 with grade 1 anterolisthesis and severe facet arthrosis, unchanged. 3. Unchanged moderate spinal canal stenosis at L5-S1 with moderate bilateral neural foraminal stenosis and moderate facet arthrosis. 4. Unchanged severe bilateral L1 neural foraminal stenosis.   Electronically Signed   By: Ulyses Jarred M.D.   On: 08/07/2017 20:23   He reports that he has never smoked. He has never used smokeless tobacco. No results for input(s): HGBA1C, LABURIC in the last 8760 hours.  Objective:  VS:  HT:    WT:   BMI:     BP:(!) 141/83  HR:96bpm  TEMP: ( )  RESP:  Physical Exam Vitals and nursing note reviewed.  Constitutional:      General: He is not in acute distress.    Appearance: Normal appearance. He is well-developed. He is obese.  HENT:     Head: Normocephalic and atraumatic.  Eyes:     Conjunctiva/sclera: Conjunctivae normal.     Pupils: Pupils are equal, round, and reactive to light.  Cardiovascular:     Rate and Rhythm: Normal rate.     Pulses: Normal pulses.     Heart sounds: Normal heart sounds.  Pulmonary:     Effort: Pulmonary effort is normal. No respiratory distress.  Musculoskeletal:     Cervical back: Normal range of  motion and neck supple. No rigidity.     Right lower leg: Edema present.     Left lower leg: Edema present.     Comments: Patient ambulates with a forward flexed lumbar spine.  He is slow to rise from seated position to full extension.  He has a negative slump test bilaterally no pain over the greater trochanters no pain with hip rotation.  He has decreased sensation in L5 dermatome on the right.  He does not appear to have focal atrophy of any musculature but he is having a lot of lower extremity edema.  He is weak and dorsiflexion on the right with 3+ out of 5.  He has good plantar flexion.  Skin:    General: Skin is warm and dry.     Findings: No erythema or rash.  Neurological:     General: No focal deficit present.     Mental Status: He is alert and oriented to person, place, and time.     Sensory: Sensory deficit present.     Motor: Weakness present.     Coordination: Coordination normal.     Gait: Gait abnormal.  Psychiatric:        Mood and Affect: Mood normal.        Behavior: Behavior normal.     Ortho Exam  Imaging: No results found.  Past Medical/Family/Surgical/Social History: Medications & Allergies reviewed per EMR, new medications updated. Patient Active Problem List   Diagnosis Date Noted  . Spinal stenosis of lumbar region with neurogenic claudication 01/15/2017  . Spondylolisthesis of lumbar region 01/15/2017  . Claw toe, acquired, left 01/06/2017  . Achilles tendon contracture, bilateral 01/06/2017  . Pain in metatarsus of both feet 01/06/2017  . Tubular adenoma of colon 12/29/2016  . Basal cell carcinoma 08/19/2016  . Umbilical hernia s/p lap repair with mesh 06/27/2016 06/27/2016  . Degenerative arthritis of knee 03/22/2015  . Dyspnea on exertion 01/31/2015  . Lumbar radiculopathy 12/12/2014  . Facet hypertrophy of lumbar region 12/12/2014  . Left knee pain 12/12/2014  . Ulcer of lower limb ([place]) 01/10/2014  . Varicose veins of lower extremities with  other complications 97/67/3419  . 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 Renal insufficiency 12/21/2013  . Swelling in head/neck 07/28/2012  . Venous (peripheral) insufficiency 05/25/2012  . 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 Pain in limb 02/12/2012  . Hyperlipidemia 02/28/2008  . Allergic rhinitis 02/28/2008  . Sleep apnea 02/28/2008   Past Medical History:  Diagnosis Date  . Allergy    takes Zyrtec daily  . Anxiety    takes Ativan daily as needed  . Arthritis   . Asthma   . Back pain   . BPH (benign prostatic hyperplasia)    takes doxazosin for  . 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 Depression    takes Wellbutrin daily  . Dyspnea on exertion    with exertion  . GERD (gastroesophageal reflux disease)    takes Nexium and Omeprazole daily  . History of kidney stones   . 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 Hyperlipidemia   . Neuromuscular disorder ([place])   . Prediabetes   . Sleep apnea   . Swelling of lower extremity    right more than leg leg  . Umbilical hernia    Family History  Problem Relation Age of Onset  . Thyroid disease Mother   . Heart disease Father   . Diabetes Maternal Grandfather   . Colon cancer Neg Hx   . Esophageal cancer Neg Hx    Past Surgical History:  Procedure Laterality Date  . ABDOMINAL AORTOGRAM W/LOWER EXTREMITY N/A 03/14/2020   Procedure: ABDOMINAL AORTOGRAM W/LOWER EXTREMITY;  Surgeon: Serafina Mitchell, MD;  Location: [facility];  Service: Vascular;  Laterality: N/A;  . 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 BACK SURGERY  2003   L4, L5  . BACK SURGERY    . CATARACT EXTRACTION, BILATERAL    . ENDOVENOUS ABLATION SAPHENOUS VEIN W/ LASER Right 06-01-2014   EVLA right greater saphenous vein by Curt Jews MD   . epidural  steroid injections        [facility] dr [facility]  . hernia repair  2003  . HERNIA REPAIR     hernia inguinal x 2  . TOTAL  KNEE ARTHROPLASTY Left 03/22/2015   Procedure: TOTAL KNEE ARTHROPLASTY;  Surgeon: Meredith Pel, MD;  Location: [facility];  Service: Orthopedics;  Laterality: Left;  . UMBILICAL HERNIA REPAIR N/A 06/27/2016   Procedure: LAPAROSCOPIC ASSISTED REPAIR OF UMBILICAL HERINA WITH MESH;  Surgeon: Johnathan Hausen, MD;  Location: [facility];  Service: General;  Laterality: N/A;  . VASCULAR SURGERY  2015   right leg   Social History   Occupational History  . 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 Smoking status: Never Smoker  . Smokeless tobacco: Never Used  Substance and Sexual Activity  . Alcohol use: No  . Drug use: No  . Sexual activity: Yes    Birth control/protection: Condom

## 2020-04-13 NOTE — Progress Notes (Signed)
[name] - 79 y.o. male MRN [id number]  Date of birth: 22-Nov-1940  Office Visit Note: Visit Date: 11/10/2019 PCP: Cari Caraway, MD Referred by: Cari Caraway, MD  Subjective: Chief Complaint  Patient presents with  . Lower Back - Pain  . Right Leg - Pain   HPI:  [name] is a 79 y.o. male who comes in today For reevaluation and management status post bilateral L4 transforaminal injection a few weeks ago and lumbar spine MRI which has been updated.  Please see our prior notes for further details and justification.  Patient continues to have mid lower back pain radiating the right leg down to the foot with numbness tingling and weakness.  Weakness has not been progressive but is still fairly weak with some foot slapping and foot drop on the right.  Much less pain on the left.  No bowel or bladder changes.  Patient does have bladder difficulties with history of diuretic use.  He called back after the last injection with it not helping and wanted another injection.  I tried to have a long talk with him about the fact that he seems to be having nerve damage at this level of the prior stenosis causing L5 pain.  We did update the MRI this is reviewed with him today and reviewed below.  He has progressive stenosis at L2-3 and L3-4 and L4-5.  He has listhesis of L4 on L5.  He has a very tight spot at L4-5 which is only gotten worse.  He does have disc protrusion at L5-S1 which would be more likely to affect the S1 nerve root and he is having symptoms more L5.  He has no problem with plantar flexion.  Patient is adamant today for an injection so we will go ahead and complete interlaminar injection below the level of stenosis and just see from an interlaminar approach to gives him any relief from a pain standpoint.  We will make referral to Dr. Ellene Route at [facility] and will also try to get set up an AFO for his right foot.  I am not sure I could afford it any  stronger that he needs surgical consultation.  We did talk about red flag complaints that would be even more severe in terms of neurogenic type issues and those were gone over at great length including urinary retention and bowel bladder incontinence.  ROS Otherwise per HPI.  Assessment & Plan: Visit Diagnoses:  1. Spinal stenosis of lumbar region with neurogenic claudication   2. Lumbar radiculopathy   3. Spondylolisthesis of lumbar region   4. Foot drop, right     Plan: No additional findings.   Meds & Orders:  Meds ordered this encounter  Medications  . methylPREDNISolone acetate (DEPO-MEDROL) injection 40 mg    Orders Placed This Encounter  Procedures  . XR C-ARM NO REPORT  . Epidural Steroid injection    Follow-up: Return for Referral to Dr. Kristeen Miss.   Procedures: No procedures performed  Lumbar Epidural Steroid Injection - Interlaminar Approach with Fluoroscopic Guidance  Patient: [name]      Date of Birth: 1941/09/06 MRN: [id number] PCP: Cari Caraway, MD      Visit Date: 11/10/2019   Universal Protocol:     Consent Given By: the patient  Position: PRONE  Additional Comments: Vital signs were monitored before and after the procedure. Patient was prepped and draped in the usual sterile fashion. The correct patient, procedure, and  site was verified.   Injection Procedure Details:  Procedure Site One Meds Administered:  Meds ordered this encounter  Medications  . methylPREDNISolone acetate (DEPO-MEDROL) injection 40 mg     Laterality: Right  Location/Site:  L5-S1  Needle size: 20 G  Needle type: Tuohy  Needle Placement: Paramedian epidural  Findings:   -Comments: Excellent flow of contrast into the epidural space.  Procedure Details: Using a paramedian approach from the side mentioned above, the region overlying the inferior lamina was localized under fluoroscopic visualization and the soft tissues overlying this structure were  infiltrated with 4 ml. of 1% Lidocaine without Epinephrine. The Tuohy needle was inserted into the epidural space using a paramedian approach.   The epidural space was localized using loss of resistance along with lateral and bi-planar fluoroscopic views.  After negative aspirate for air, blood, and CSF, a 2 ml. volume of Isovue-250 was injected into the epidural space and the flow of contrast was observed. Radiographs were obtained for documentation purposes.    The injectate was administered into the level noted above.   Additional Comments:  The patient tolerated the procedure well Dressing: 2 x 2 sterile gauze and Band-Aid    Post-procedure details: Patient was observed during the procedure. Post-procedure instructions were reviewed.  Patient left the clinic in stable condition.     Clinical History: MRI LUMBAR SPINE WITHOUT CONTRAST    TECHNIQUE:  Multiplanar, multisequence MR imaging of the lumbar spine was  performed. No intravenous contrast was administered.    COMPARISON: Are lumbar spine 08/07/2017    FINDINGS:  Segmentation: 5 non rib-bearing lumbar type vertebral bodies are  present. The lowest fully formed vertebral body is L5.    Alignment: Slight retrolisthesis at L1-2 and L2-3 is stable. Grade 1  anterolisthesis L4-5 measures 7 mm, slightly increased. Levoconvex  curvature is centered at L2.    Vertebrae: Progressive sclerotic endplate changes are present on the  right at L2-3. Progressive fatty endplate changes are present on the  right at L1-2. There is progression of mild chronic endplate changes  on the left at L4-5 and L5-S1.    Conus medullaris and cauda equina: Conus extends to the L1 level.  Conus and cauda equina appear normal.    Paraspinal and other soft tissues: Limited imaging the abdomen is  unremarkable. There is no significant adenopathy. No solid organ  lesions are present.    Disc levels:    T12-L1: Negative.    L1-2: A  broad-based disc protrusion is present. Moderate facet  hypertrophy is worse on the left. Progressive synovial cyst  contributes to moderate to severe right subarticular stenosis. Mild  left subarticular narrowing is present. Moderate foraminal stenosis  has progressed, worse right than left.    L2-3: A broad-based disc protrusion is present. Moderate facet  hypertrophy is noted bilaterally. Progressive moderate central canal  stenosis is present. Severe left and moderate right subarticular  narrowing is present. Severe left and moderate right foraminal  stenosis is present.    L3-4: A broad-based disc protrusion is present. Progressive facet  hypertrophy is evident. Facet spurring contributes to moderate  subarticular stenosis, left greater than right. Moderate left and  mild right foraminal stenosis is present.    L4-5: There is uncovering of a broad-based disc protrusion. Advanced  facet hypertrophy is noted. Progressive severe central canal  stenosis is present. Severe left and moderate right foraminal  stenosis is present.    L5-S1: A central disc protrusion is present. Moderate  central canal  stenosis has progressed. Severe right and moderate left subarticular  narrowing is present. Moderate foraminal stenosis is present  bilaterally.    IMPRESSION:  1. Progressive multilevel spondylosis of the lumbar spine as  described.  2. Progressive moderate to severe right subarticular and moderate  foraminal stenosis at L1-2.  3. Progressive moderate central canal stenosis at L2-3 with severe  left and moderate right subarticular stenosis.  4. Severe left and moderate right foraminal stenosis at L2-3.  5. Moderate subarticular and left greater than right foraminal  stenosis at L3-4.  6. Progressive severe central canal stenosis and severe left and  moderate right foraminal stenosis at L4-5.  7. Moderate central canal stenosis with severe right and moderate  left subarticular  and moderate foraminal stenosis bilaterally at  L5-S1.      Electronically Signed  By: San Morelle M.D.  On: 10/19/2019 08:40     Objective:  VS:  HT:    WT:   BMI:     BP:(!) 164/88  HR:93bpm  TEMP: ( )  RESP:  Physical Exam Constitutional:      General: He is not in acute distress.    Appearance: Normal appearance. He is not ill-appearing.  HENT:     Head: Normocephalic and atraumatic.     Right Ear: External ear normal.     Left Ear: External ear normal.  Eyes:     Extraocular Movements: Extraocular movements intact.  Cardiovascular:     Rate and Rhythm: Normal rate.     Pulses: Normal pulses.  Abdominal:     General: There is no distension.     Palpations: Abdomen is soft.  Musculoskeletal:        General: No tenderness or signs of injury.     Right lower leg: No edema.     Left lower leg: No edema.     Comments: Patient ambulates without aid with a weak right foot with foot drop.  He has decreased sensation in L5 dermatome on the right.  He has no pain with hip rotation he has good strength in the proximal lower limbs.  He has weakness with dorsiflexion on the right with good plantar flexion.  Skin:    Findings: No erythema or rash.  Neurological:     General: No focal deficit present.     Mental Status: He is alert and oriented to person, place, and time.     Sensory: No sensory deficit.     Motor: No weakness or abnormal muscle tone.     Coordination: Coordination normal.  Psychiatric:        Mood and Affect: Mood normal.        Behavior: Behavior normal.      Imaging: No results found.

## 2020-05-07 ENCOUNTER — Telehealth: Payer: Self-pay | Admitting: Physical Medicine and Rehabilitation

## 2020-05-07 NOTE — Telephone Encounter (Signed)
Patient called. He would like an appointment with Dr. Ernestina Patches. His call back number is

## 2020-05-08 DIAGNOSIS — G5602 Carpal tunnel syndrome, left upper limb: Secondary | ICD-10-CM | POA: Diagnosis not present

## 2020-05-17 ENCOUNTER — Other Ambulatory Visit: Payer: Self-pay | Admitting: Physician Assistant

## 2020-05-30 ENCOUNTER — Other Ambulatory Visit: Payer: Self-pay | Admitting: Physician Assistant

## 2020-05-30 ENCOUNTER — Telehealth: Payer: Self-pay | Admitting: Cardiovascular Disease

## 2020-05-30 NOTE — Telephone Encounter (Signed)
Pt c/o Shortness Of Breath: STAT if SOB developed within the last 24 hours or pt is noticeably SOB on the phone  1. Are you currently SOB (can you hear that pt is SOB on the phone)? Yes   2. How long have you been experiencing SOB? Last few days   3. Are you SOB when sitting or when up moving around? Both   4. Are you currently experiencing any other symptoms?

## 2020-05-30 NOTE — Telephone Encounter (Signed)
Call was sent in from schedulers as a stat call.  Patient is complaining of SOB, and stating that for the past few nights he has noticed when he is sleeping he can feel is pulse "beating in his head" he states when he gets up he becomes dizzy, and he becomes hot feeling very quickly, he complaints of having chest discomfort, and swelling in his legs, he also states he becomes exhausted quickly.   Patient was advised that I did not have any open spots at this time, and would have Dr.Berry review- if okay to wait for appointment in a few weeks.  I did suggest to patient if it continues to worsen to go to ER, and patient refused to go to ER due to covid at this time.  He will wait until he is able to be seen, patient states he will come in the waiting room until an appointment time became available he did not mind to do this, and I did advise with him that we can only have but so many in the office at once due to covid restrictions, so we would have to call him with an appointment.  Patient verbalized understanding.

## 2020-05-31 ENCOUNTER — Telehealth: Payer: Self-pay

## 2020-05-31 ENCOUNTER — Telehealth: Payer: Self-pay | Admitting: Pulmonary Disease

## 2020-05-31 NOTE — Telephone Encounter (Signed)
Left voice mail to see if he couldbe more specific

## 2020-05-31 NOTE — Telephone Encounter (Signed)
Patient called. He would like an appointment with Dr. Ernestina Patches. His call back number is   [phone]

## 2020-05-31 NOTE — Telephone Encounter (Signed)
Have patient see an APP in the office next week

## 2020-05-31 NOTE — Telephone Encounter (Signed)
Called and left message for patient to return call. Message says patient is requesting Breo samples but this medication is not on his profile. Patient is on Symbicort. Waiting for call.

## 2020-05-31 NOTE — Telephone Encounter (Signed)
Patient's pharmacy visit was in February. Per [employer], application was not received. Per notes, pharmacist was awaiting income documents. [employer] does not require at this time. Located patient's application and faxed to [employer]. Will follow up with [place] on status and update patient.   Please advise on Breo samples for patient.

## 2020-05-31 NOTE — Telephone Encounter (Signed)
Patient called in wanting to ask questions about medication

## 2020-06-01 ENCOUNTER — Telehealth: Payer: Self-pay

## 2020-06-01 ENCOUNTER — Telehealth: Payer: Self-pay | Admitting: Orthopedic Surgery

## 2020-06-01 MED ORDER — BREO ELLIPTA 100-25 MCG/INH IN AEPB: 1.0000 | INHALATION_SPRAY | Freq: Every day | RESPIRATORY_TRACT | 0 refills | Status: DC

## 2020-06-01 NOTE — Telephone Encounter (Signed)
Spoke with patient. He is ok with using Breo 100. I advised him that I would place 2 samples up front for him. He verbalized understanding of instructions.   Nothing further needed at time of call.

## 2020-06-01 NOTE — Telephone Encounter (Signed)
Ok to give samples of breo 200 if we have the samples

## 2020-06-01 NOTE — Telephone Encounter (Signed)
Pt called back about this, please return call.  

## 2020-06-01 NOTE — Telephone Encounter (Signed)
Pt states he missed a call from [facility] and would like fore her to try him again.   [phone]

## 2020-06-01 NOTE — Telephone Encounter (Signed)
Patient called stating that he would like to have a call back from Dr. Sharol Given due to not having a call back from [facility].  Cb# [phone].  Please advise.  Thank you.

## 2020-06-01 NOTE — Telephone Encounter (Signed)
I checked the samples closet but we only have a few samples of Breo 100.

## 2020-06-01 NOTE — Telephone Encounter (Signed)
Pt has an appt with Dr. Sharol Given on Monday at 8:45 pt very unhappy that his message was not relayed in its detail when he called yesterday and that his rx for prednisone was declined and that no one called him to make an appt for follow up once it had been declined.

## 2020-06-01 NOTE — Telephone Encounter (Signed)
Breo 100 is ok

## 2020-06-01 NOTE — Telephone Encounter (Signed)
Called and spoke with pt. Notified Dr.Berry wanted to get him an appt with an APP next week but there was nothing available. Able to schedule pt with Coletta Memos NP on 06/11/20. Pt states he does not want to see anyone but Dr.Berry. states he has seen APPs before and this doesn't work for him. Able to schedule pt with Dr.Berry on 06/15/20 at 9:00am. Pt reports he is still having the SOB, chest discomfort and "beating" in his head. He reports the beating wakes him up at 3am and the very top of his head just beats. Reported that if these symptoms became worse we recommend he go to the ED to be evaluated. Pt states he does not want to go to the ED because he does not want to get Covid. Pt thankful for the call and verbalized understanding with his appt with Dr.Berry. no other questions at this time.

## 2020-06-01 NOTE — Telephone Encounter (Signed)
LVM2CB 7/23  Per Dr.Berry, "Have patient see an APP in the office next week" No available appts next week with app. Able to schedule appt with Coletta Memos on 06/11/20 at 3:15pm.

## 2020-06-01 NOTE — Telephone Encounter (Signed)
Duplicate message pt has been called.

## 2020-06-01 NOTE — Telephone Encounter (Signed)
Scheduled for OV on 7/28. Patient states that he called in June and never received a call back. I do see a telephone encounter in his cart that he was requesting an appointment, but the call was not routed so we did not know he had called.

## 2020-06-01 NOTE — Telephone Encounter (Signed)
Patient called to check on status of callback.

## 2020-06-01 NOTE — Telephone Encounter (Signed)
Patient returned call, transferred to nurse [facility].

## 2020-06-01 NOTE — Telephone Encounter (Signed)
Patient still waiting on financial paperwork to come through. He is currently not taking Symbicort. He is requesting a sample of Breo but it is not on his profile. He reports needing something until the Symbicort assistance comes through. Please advise.

## 2020-06-04 ENCOUNTER — Ambulatory Visit (INDEPENDENT_AMBULATORY_CARE_PROVIDER_SITE_OTHER): Payer: Medicare Other | Admitting: Orthopedic Surgery

## 2020-06-04 ENCOUNTER — Other Ambulatory Visit: Payer: Self-pay

## 2020-06-04 ENCOUNTER — Encounter: Payer: Self-pay | Admitting: Orthopedic Surgery

## 2020-06-04 DIAGNOSIS — M25532 Pain in left wrist: Secondary | ICD-10-CM

## 2020-06-04 DIAGNOSIS — S39001A Unspecified injury of muscle, fascia and tendon of abdomen, initial encounter: Secondary | ICD-10-CM

## 2020-06-04 DIAGNOSIS — G5602 Carpal tunnel syndrome, left upper limb: Secondary | ICD-10-CM

## 2020-06-04 DIAGNOSIS — M25531 Pain in right wrist: Secondary | ICD-10-CM | POA: Diagnosis not present

## 2020-06-04 NOTE — Progress Notes (Signed)
Office Visit Note   Patient: [name]           Date of Birth: [birth year]           MRN: [id number] Visit Date: 06/04/2020              Requested by: Cari Caraway, [place],  [place] [address] PCP: Cari Caraway, MD  Chief Complaint  Patient presents with  . Right Hip - Pain      HPI: Patient is a 79 year old gentleman who complains of abdominal fascia pain just proximal to the iliac crest on the right.  Patient states the pain is worse with squatting and he has to push in on the fascia to stand up.  Patient states he has seen Dr. Zigmund Daniel in general surgery and was told that he does not have a fascial defect.  Patient states he has had a history of multiple hernias.  Patient also complains of numbness and tingling in the thumb index long and ring finger in the left hand.  Patient also complains of chronic lower back pain that states he is having this addressed with Dr. Ernestina Patches.  Assessment & Plan: Visit Diagnoses:  1. Bilateral wrist pain   2. Injury of fascia of abdomen   3. Carpal tunnel syndrome on left     Plan: Recommended weight loss to decrease the stress on the fascia of the abdomen.  We will set up a nerve conduction study to rule out carpal tunnel syndrome on the left.  Follow-Up Instructions: Return in about 4 weeks (around 07/02/2020).   Ortho Exam  Patient is alert, oriented, no adenopathy, well-dressed, normal affect, normal respiratory effort. Examination patient area of tenderness is just proximal to the fascial insertion on the iliac crest on the right.  There is no pain to palpation of the iliac crest no pain over the lateral femoral cutaneous nerve.  Pain is primarily over the fascia with increased abdominal pressure I cannot palpate a fascial defect.  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  Patient does have  decreased grip strength on the left.  Phalen's and Tinel's test are negative.  Imaging: No results found. No images are attached to the encounter.  Labs: Lab Results  Component Value Date   HGBA1C 5.8 03/11/2016   HGBA1C 5.5 11/10/2015   HGBA1C 5.7 (H) 08/27/2015   ESRSEDRATE 7 11/10/2015   LABURIC 7.0 01/15/2018   LABURIC 7.3 11/10/2015   REPTSTATUS 03/23/2015 FINAL 03/21/2015   GRAMSTAIN No WBC Seen 03/13/2014   GRAMSTAIN No Squamous Epithelial Cells Seen 03/13/2014   GRAMSTAIN Rare Gram Positive Cocci In Pairs 03/13/2014   CULT  03/21/2015    INSIGNIFICANT GROWTH Performed at [place] 04/05/2015     Lab Results  Component Value Date   ALBUMIN 4.0 08/05/2019   ALBUMIN 4.1 05/19/2016   ALBUMIN 4.2 03/11/2016   LABURIC 7.0 01/15/2018   LABURIC 7.3 11/10/2015    No results found for: MG No results found for: VD25OH  No results found for: PREALBUMIN CBC EXTENDED Latest Ref Rng & Units 03/14/2020 12/05/2019 08/05/2019  WBC 4.0 - 10.5 K/uL - 6.0 5.6  RBC 4.22 - 5.81 Mil/uL - 5.01 4.87  HGB 13.0 - 17.0 g/dL 16.0 15.4 15.2  HCT 39 - 52 % 47.0 46.5 44.6  PLT 150 -  400 K/uL - 164.0 156.0  NEUTROABS 1.4 - 7.7 K/uL - 4.3 3.7  LYMPHSABS 0.7 - 4.0 K/uL - 0.9 1.1     There is no height or weight on file to calculate BMI.  Orders:  Orders Placed This Encounter  Procedures  . Ambulatory referral to Physical Medicine Rehab   No orders of the defined types were placed in this encounter.    Procedures: No procedures performed  Clinical Data: No additional findings.  ROS:  All other systems negative, except as noted in the HPI. Review of Systems  Objective: Vital Signs: There were no vitals taken for this visit.  Specialty Comments:  No specialty comments available.  PMFS History: Patient Active Problem List   Diagnosis Date Noted  . Spinal stenosis of lumbar region with neurogenic claudication 01/15/2017  . Spondylolisthesis  of lumbar region 01/15/2017  . Claw toe, acquired, left 01/06/2017  . Achilles tendon contracture, bilateral 01/06/2017  . Pain in metatarsus of both feet 01/06/2017  . Tubular adenoma of colon 12/29/2016  . Basal cell carcinoma 08/19/2016  . Umbilical hernia s/p lap repair with mesh 06/27/2016 06/27/2016  . Degenerative arthritis of knee 03/22/2015  . Dyspnea on exertion 01/31/2015  . Lumbar radiculopathy 12/12/2014  . Facet hypertrophy of lumbar region 12/12/2014  . Left knee pain 12/12/2014  . Ulcer of lower limb ([place]) 01/10/2014  . Varicose veins of lower extremities with other complications 89/38/1017  . 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 Renal insufficiency 12/21/2013  . Swelling in head/neck 07/28/2012  . Venous (peripheral) insufficiency 05/25/2012  . 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 Pain in limb 02/12/2012  . Hyperlipidemia 02/28/2008  . Allergic rhinitis 02/28/2008  . Sleep apnea 02/28/2008   Past Medical History:  Diagnosis Date  . Allergy    takes Zyrtec daily  . Anxiety    takes Ativan daily as needed  . Arthritis   . Asthma   . Back pain   . BPH (benign prostatic hyperplasia)    takes doxazosin for  . 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 Depression    takes Wellbutrin daily  . Dyspnea on exertion    with exertion  . GERD (gastroesophageal reflux disease)    takes Nexium and Omeprazole daily  . History of kidney stones   . 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 Hyperlipidemia   . Neuromuscular disorder ([place])   . Prediabetes   . Sleep apnea   . Swelling of lower extremity    right more than leg leg  . Umbilical hernia     Family History  Problem Relation Age of Onset  . Thyroid disease Mother   . Heart disease Father   . Diabetes Maternal Grandfather   . Colon cancer Neg Hx   . Esophageal cancer Neg Hx     Past Surgical History:  Procedure Laterality Date  . ABDOMINAL AORTOGRAM  W/LOWER EXTREMITY N/A 03/14/2020   Procedure: ABDOMINAL AORTOGRAM W/LOWER EXTREMITY;  Surgeon: Serafina Mitchell, MD;  Location: [place] [facility];  Service: Vascular;  Laterality: N/A;  . 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 BACK SURGERY  2003   L4, L5  . BACK SURGERY    . CATARACT EXTRACTION, BILATERAL    . ENDOVENOUS ABLATION SAPHENOUS VEIN W/ LASER Right 06-01-2014   EVLA right greater saphenous vein by Curt Jews MD   . epidural  steroid injections        [facility] dr [place]  . hernia repair  2003  . HERNIA REPAIR     hernia inguinal x 2  . TOTAL KNEE ARTHROPLASTY Left 03/22/2015   Procedure: TOTAL KNEE ARTHROPLASTY;  Surgeon: Meredith Pel, MD;  Location: [place];  Service: Orthopedics;  Laterality: Left;  . UMBILICAL HERNIA REPAIR N/A 06/27/2016   Procedure: LAPAROSCOPIC ASSISTED REPAIR OF UMBILICAL HERINA WITH MESH;  Surgeon: Johnathan Hausen, MD;  Location: [facility];  Service: General;  Laterality: N/A;  . VASCULAR SURGERY  2015   right leg   Social History   Occupational History  . 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 Smoking status: Never Smoker  . Smokeless tobacco: Never Used  Vaping Use  . Vaping Use: Never used  Substance and Sexual Activity  . Alcohol use: No  . Drug use: No  . Sexual activity: Yes    Birth control/protection: Condom

## 2020-06-05 NOTE — Telephone Encounter (Signed)
Called [employer] to check status of Symbicort Patient Assistance.    Received notification from  [employer] regarding an Approval for Symbicort patient assistance from 06/04/20 to 06/03/21. 1st shipment will go out in 5-7 business days.  Phone number:[phone]  Called patient to advise

## 2020-06-06 ENCOUNTER — Other Ambulatory Visit: Payer: Self-pay

## 2020-06-06 ENCOUNTER — Ambulatory Visit (INDEPENDENT_AMBULATORY_CARE_PROVIDER_SITE_OTHER): Payer: Medicare Other | Admitting: Physical Medicine and Rehabilitation

## 2020-06-06 ENCOUNTER — Encounter: Payer: Self-pay | Admitting: Physical Medicine and Rehabilitation

## 2020-06-06 VITALS — BP 140/75 | HR 79

## 2020-06-06 DIAGNOSIS — M5416 Radiculopathy, lumbar region: Secondary | ICD-10-CM

## 2020-06-06 DIAGNOSIS — M48062 Spinal stenosis, lumbar region with neurogenic claudication: Secondary | ICD-10-CM

## 2020-06-06 DIAGNOSIS — M21371 Foot drop, right foot: Secondary | ICD-10-CM | POA: Diagnosis not present

## 2020-06-06 DIAGNOSIS — R202 Paresthesia of skin: Secondary | ICD-10-CM | POA: Diagnosis not present

## 2020-06-06 NOTE — Progress Notes (Signed)
Low back pain on both sides right more than left. Sharp pains. Right leg pain with sitting. Has "learned how to deal with foot drop."  Patient reports that he was late today because he is having dizziness and blurred vision and generally feels unwell.  Numeric Pain Rating Scale and Functional Assessment Average Pain 7   In the last MONTH (on 0-10 scale) has pain interfered with the following?  1. General activity like being  able to carry out your everyday physical activities such as walking, climbing stairs, carrying groceries, or moving a chair?  Rating(7)

## 2020-06-07 ENCOUNTER — Encounter: Payer: Medicare Other | Admitting: Physical Medicine and Rehabilitation

## 2020-06-07 ENCOUNTER — Encounter: Payer: Self-pay | Admitting: *Deleted

## 2020-06-08 ENCOUNTER — Encounter: Payer: Self-pay | Admitting: Physical Medicine and Rehabilitation

## 2020-06-08 DIAGNOSIS — M21371 Foot drop, right foot: Secondary | ICD-10-CM | POA: Insufficient documentation

## 2020-06-08 DIAGNOSIS — R202 Paresthesia of skin: Secondary | ICD-10-CM | POA: Insufficient documentation

## 2020-06-08 NOTE — Progress Notes (Signed)
[name] - 79 y.o. male MRN [id number]  Date of birth: 10-07-1941  Office Visit Note: Visit Date: 06/06/2020 PCP: Cari Caraway, MD Referred by: Cari Caraway, MD  Subjective: Chief Complaint  Patient presents with   Lower Back - Pain   HPI: [name] is a 79 y.o. male who comes in today For evaluation management of back pain and right more than left hip and leg pain.  Patient is well-known to's and really well-known to our practice.  Most recently he has been seen by Dr. Meridee Score and he is assistant.  The patient has a history of lumbar spine stenosis at multiple levels particular at L4-5 with listhesis and severe stenosis and most recently foot drop on the right.  He has been seeing Dr. Kristeen Miss at [facility].  He does have follow-up with him.  He has not really wanted to get an AFO for his foot.  He still reports numbness and tingling in the foot.  He reports with increased swelling that he has in the lower extremities combined with stockings it actually seems to help him when he walks.  He does report over the last several days he has just been not feeling well in general.  He is having some dizziness and vision changes to a degree.  No vertigo symptoms no falls.  No sign or symptoms of infection with cough or headache or fevers etc.  His main complaint is in his legs right leg worse with sitting does get some relief with moving around but also pain with standing and walking.  Again he says" that he has learned how to deal with the foot drop."  He was placed on a course of prednisone for almost 8 weeks by Dr. Sharol Given.  He just came off prednisone a few days ago.  He does feel overall that he is bloated.  He does not have any increased swelling today in the legs compared to normal.  He is on a diuretic.  He does have an appointment with his cardiologist upcoming.  He has not seen his primary care physician.  Vital signs today were normal.  He has  not noted any new weakness or bowel or bladder changes.  No falls.  Interestingly he just saw Dr. Sharol Given couple days ago but the complaints were more related to his hands and electrodiagnostic study of the hands was ordered at that time.  Review of Systems  Constitutional: Positive for malaise/fatigue.  Musculoskeletal: Positive for back pain and joint pain.  Neurological: Positive for tingling and focal weakness.  All other systems reviewed and are negative.  Otherwise per HPI.  Assessment & Plan: Visit Diagnoses:  1. Spinal stenosis of lumbar region with neurogenic claudication   2. Lumbar radiculopathy   3. Paresthesia of skin   4. Right foot drop     Plan: Findings:  1.  Chronic worsening severe at times back pain with radicular type pain with paresthesia on the right.  Unfortunately the right sided paresthesias down into the foot are likely just going to stay there with his severe stenosis until there is decompression or if there is any chance of nerve recovery.  He has foot drop on the right which is pretty weak he does have some motion in the right foot.  I think it would be fine to complete transforaminal injection again at the level of stenosis from a transforaminal approach with a small flow of medication just to see  if we get him some pain relief.  I tried to tell him on numerous occasions is not good to make the tingling or the weakness better.  He is considering surgical treatment by Dr. Ellene Route.  He continues to follow-up with him.  2.  In terms of his just feeling not very well lately I think it may be to coming off the 8-week course of prednisone.  It sounds like some of the effects of just maybe the prednisone itself may be making him feel as he does.  He does not carry a diagnosis of diabetes although obviously the prednisone could elevate his blood glucose.  He has eaten this morning.  I have asked him that if he could follow-up with his primary care physician that would be the  first start.  I do think it is probably nothing to worry about his vital signs appear well his blood pressure is good.  It does not seem to be adrenal crisis he has good blood pressure at this point.    Meds & Orders: No orders of the defined types were placed in this encounter.  No orders of the defined types were placed in this encounter.   Follow-up: Return for Bilateral L4 transforaminal injection.   Procedures: No procedures performed  No notes on file   Clinical History: MRI LUMBAR SPINE WITHOUT CONTRAST    TECHNIQUE:  Multiplanar, multisequence MR imaging of the lumbar spine was  performed. No intravenous contrast was administered.    COMPARISON: Are lumbar spine 08/07/2017    FINDINGS:  Segmentation: 5 non rib-bearing lumbar type vertebral bodies are  present. The lowest fully formed vertebral body is L5.    Alignment: Slight retrolisthesis at L1-2 and L2-3 is stable. Grade 1  anterolisthesis L4-5 measures 7 mm, slightly increased. Levoconvex  curvature is centered at L2.    Vertebrae: Progressive sclerotic endplate changes are present on the  right at L2-3. Progressive fatty endplate changes are present on the  right at L1-2. There is progression of mild chronic endplate changes  on the left at L4-5 and L5-S1.    Conus medullaris and cauda equina: Conus extends to the L1 level.  Conus and cauda equina appear normal.    Paraspinal and other soft tissues: Limited imaging the abdomen is  unremarkable. There is no significant adenopathy. No solid organ  lesions are present.    Disc levels:    T12-L1: Negative.    L1-2: A broad-based disc protrusion is present. Moderate facet  hypertrophy is worse on the left. Progressive synovial cyst  contributes to moderate to severe right subarticular stenosis. Mild  left subarticular narrowing is present. Moderate foraminal stenosis  has progressed, worse right than left.    L2-3: A broad-based disc protrusion  is present. Moderate facet  hypertrophy is noted bilaterally. Progressive moderate central canal  stenosis is present. Severe left and moderate right subarticular  narrowing is present. Severe left and moderate right foraminal  stenosis is present.    L3-4: A broad-based disc protrusion is present. Progressive facet  hypertrophy is evident. Facet spurring contributes to moderate  subarticular stenosis, left greater than right. Moderate left and  mild right foraminal stenosis is present.    L4-5: There is uncovering of a broad-based disc protrusion. Advanced  facet hypertrophy is noted. Progressive severe central canal  stenosis is present. Severe left and moderate right foraminal  stenosis is present.    L5-S1: A central disc protrusion is present. Moderate central canal  stenosis  has progressed. Severe right and moderate left subarticular  narrowing is present. Moderate foraminal stenosis is present  bilaterally.    IMPRESSION:  1. Progressive multilevel spondylosis of the lumbar spine as  described.  2. Progressive moderate to severe right subarticular and moderate  foraminal stenosis at L1-2.  3. Progressive moderate central canal stenosis at L2-3 with severe  left and moderate right subarticular stenosis.  4. Severe left and moderate right foraminal stenosis at L2-3.  5. Moderate subarticular and left greater than right foraminal  stenosis at L3-4.  6. Progressive severe central canal stenosis and severe left and  moderate right foraminal stenosis at L4-5.  7. Moderate central canal stenosis with severe right and moderate  left subarticular and moderate foraminal stenosis bilaterally at  L5-S1.      Electronically Signed  By: San Morelle M.D.  On: 10/19/2019 08:40   He reports that he has never smoked. He has never used smokeless tobacco. No results for input(s): HGBA1C, LABURIC in the last 8760 hours.  Objective:  VS:  HT:     WT:    BMI:       BP:(!) 140/75   HR:79bpm   TEMP: ( )   RESP:  Physical Exam Vitals and nursing note reviewed.  Constitutional:      General: He is not in acute distress.    Appearance: Normal appearance. He is well-developed. He is obese.  HENT:     Head: Normocephalic and atraumatic.  Eyes:     Conjunctiva/sclera: Conjunctivae normal.     Pupils: Pupils are equal, round, and reactive to light.  Cardiovascular:     Rate and Rhythm: Normal rate.     Pulses: Normal pulses.     Heart sounds: Normal heart sounds.  Pulmonary:     Effort: Pulmonary effort is normal. No respiratory distress.  Abdominal:     General: There is distension.     Palpations: Abdomen is soft. There is no mass.     Tenderness: There is no guarding or rebound.  Musculoskeletal:     Cervical back: Normal range of motion and neck supple. No rigidity.     Right lower leg: Edema present.     Left lower leg: Edema present.     Comments: Patient ambulates without aid.  He really is not dragging the right foot but he can tell that he is doing some hip hiking.  He has strength loss in the right foot chronic with dorsiflexion.  He has no pain with hip rotation.  He has pain with standing and extension.  Skin:    General: Skin is warm and dry.     Findings: No erythema or rash.  Neurological:     General: No focal deficit present.     Mental Status: He is alert and oriented to person, place, and time.     Sensory: No sensory deficit.     Coordination: Coordination normal.     Gait: Gait normal.  Psychiatric:        Mood and Affect: Mood normal.        Behavior: Behavior normal.     Ortho Exam  Imaging: No results found.  Past Medical/Family/Surgical/Social History: Medications & Allergies reviewed per EMR, new medications updated. Patient Active Problem List   Diagnosis Date Noted   Paresthesia of skin 06/08/2020   Right foot drop 06/08/2020   Spinal stenosis of lumbar region with neurogenic claudication 01/15/2017    Spondylolisthesis of lumbar region 01/15/2017  Claw toe, acquired, left 01/06/2017   Achilles tendon contracture, bilateral 01/06/2017   Pain in metatarsus of both feet 01/06/2017   Tubular adenoma of colon 12/29/2016   Basal cell carcinoma 75/08/2584   Umbilical hernia s/p lap repair with mesh 06/27/2016 06/27/2016   Degenerative arthritis of knee 03/22/2015   Dyspnea on exertion 01/31/2015   Lumbar radiculopathy 12/12/2014   Facet hypertrophy of lumbar region 12/12/2014   Left knee pain 12/12/2014   Ulcer of lower limb ([place]) 01/10/2014   Varicose veins of lower extremities with other complications 27/78/2423   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 12/21/2013   Renal insufficiency 12/21/2013   Swelling in head/neck 07/28/2012   Venous (peripheral) insufficiency 05/25/2012   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   Pain in limb 02/12/2012   Hyperlipidemia 02/28/2008   Allergic rhinitis 02/28/2008   Sleep apnea 02/28/2008   Past Medical History:  Diagnosis Date   Allergy    takes Zyrtec daily   Anxiety    takes Ativan daily as needed   Arthritis    Asthma    Back pain    BPH (benign prostatic hyperplasia)    takes doxazosin for   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   L4, L5 ,S1   Depression    takes Wellbutrin daily   Dyspnea on exertion    with exertion   GERD (gastroesophageal reflux disease)    takes Nexium and Omeprazole daily   History of kidney stones    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   Hyperlipidemia    Neuromuscular disorder ([place])    Prediabetes    Sleep apnea    Swelling of lower extremity    right more than leg leg   Umbilical hernia    Family History  Problem Relation Age of Onset   Thyroid disease Mother    Heart disease Father    Diabetes Maternal Grandfather    Colon cancer Neg Hx    Esophageal cancer Neg Hx    Past Surgical History:  Procedure Laterality Date    ABDOMINAL AORTOGRAM W/LOWER EXTREMITY N/A 03/14/2020   Procedure: ABDOMINAL AORTOGRAM W/LOWER EXTREMITY;  Surgeon: Serafina Mitchell, MD;  Location: [facility];  Service: Vascular;  Laterality: N/A;   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   BACK SURGERY  2003   L4, L5   BACK SURGERY     CATARACT EXTRACTION, BILATERAL     ENDOVENOUS ABLATION SAPHENOUS VEIN W/ LASER Right 06-01-2014   EVLA right greater saphenous vein by Curt Jews MD    epidural steroid injections        [facility] dr Benetta Maclaren   hernia repair  2003   HERNIA REPAIR     hernia inguinal x 2   TOTAL KNEE ARTHROPLASTY Left 03/22/2015   Procedure: TOTAL KNEE ARTHROPLASTY;  Surgeon: Meredith Pel, MD;  Location: [facility];  Service: Orthopedics;  Laterality: Left;   UMBILICAL HERNIA REPAIR N/A 06/27/2016   Procedure: LAPAROSCOPIC ASSISTED REPAIR OF UMBILICAL HERINA WITH MESH;  Surgeon: Johnathan Hausen, MD;  Location: [facility];  Service: General;  Laterality: N/A;   VASCULAR SURGERY  2015   right leg   Social History   Occupational History   Occupation: [employer]: RETIRED  Tobacco Use   Smoking status: Never Smoker   Smokeless tobacco: Never Used  Vaping Use   Vaping Use: Never used  Substance and Sexual Activity  Alcohol use: No   Drug use: No   Sexual activity: Yes    Birth control/protection: Condom

## 2020-06-11 ENCOUNTER — Ambulatory Visit: Payer: Medicare Other | Admitting: General Practice

## 2020-06-12 ENCOUNTER — Ambulatory Visit: Payer: Self-pay

## 2020-06-12 ENCOUNTER — Other Ambulatory Visit: Payer: Self-pay

## 2020-06-12 ENCOUNTER — Ambulatory Visit (INDEPENDENT_AMBULATORY_CARE_PROVIDER_SITE_OTHER): Payer: Medicare Other | Admitting: Physical Medicine and Rehabilitation

## 2020-06-12 ENCOUNTER — Encounter: Payer: Self-pay | Admitting: Physical Medicine and Rehabilitation

## 2020-06-12 VITALS — BP 134/76 | HR 80

## 2020-06-12 DIAGNOSIS — M48062 Spinal stenosis, lumbar region with neurogenic claudication: Secondary | ICD-10-CM

## 2020-06-12 DIAGNOSIS — M5416 Radiculopathy, lumbar region: Secondary | ICD-10-CM | POA: Diagnosis not present

## 2020-06-12 DIAGNOSIS — M25551 Pain in right hip: Secondary | ICD-10-CM | POA: Diagnosis not present

## 2020-06-12 MED ORDER — METHYLPREDNISOLONE ACETATE 80 MG/ML IJ SUSP
80.0000 mg | Freq: Once | INTRAMUSCULAR | Status: AC
  Administered 2020-06-12: 80 mg

## 2020-06-12 NOTE — Progress Notes (Signed)
Pt states right lower back that travels to right leg. Pt states breathing and moving makes the pain worse. Pt states sitting still for a minutes helps with the pain.  Numeric Pain Rating Scale and Functional Assessment Average Pain 9   In the last MONTH (on 0-10 scale) has pain interfered with the following?  1. General activity like being  able to carry out your everyday physical activities such as walking, climbing stairs, carrying groceries, or moving a chair?  Rating(9)   +Driver, -BT, -Dye Allergies.

## 2020-06-13 NOTE — Progress Notes (Signed)
[name] - 79 y.o. male MRN [id number]  Date of birth: Oct 03, 1941  Office Visit Note: Visit Date: 06/12/2020 PCP: Cari Caraway, MD Referred by: Cari Caraway, MD  Subjective: No chief complaint on file.  HPI:  [name] is a 79 y.o. male who comes in today at the request of Dr. Laurence Spates for planned Bilateral L4-L5 Lumbar epidural steroid injection with fluoroscopic guidance.  The patient has failed conservative care including home exercise, medications, time and activity modification.  This injection will be diagnostic and hopefully therapeutic.  Please see requesting physician notes for further details and justification.  Referring: Kristeen Miss, MD   Once again, even after recent office visit and discussion, he asks question about his right leg weakness and numbness. He has been counseled for many years on lumbar stenosis, nerve root pain and damage. He continues to have foot drop. Please refer to prior notes and notes from his neurosurgeon Dr. Ellene Route.   ROS Otherwise per HPI.  Assessment & Plan: Visit Diagnoses:  1. Spinal stenosis of lumbar region with neurogenic claudication   2. Lumbar radiculopathy     Plan: No additional findings.   Meds & Orders:  Meds ordered this encounter  Medications  . methylPREDNISolone acetate (DEPO-MEDROL) injection 80 mg    Orders Placed This Encounter  Procedures  . XR C-ARM NO REPORT  . Epidural Steroid injection    Follow-up: Return if symptoms worsen or fail to improve.   Procedures: No procedures performed  Lumbosacral Transforaminal Epidural Steroid Injection - Sub-Pedicular Approach with Fluoroscopic Guidance  Patient: [name]      Date of Birth: 1941-08-28 MRN: [id number] PCP: Cari Caraway, MD      Visit Date: 06/12/2020   Universal Protocol:    Date/Time: 06/12/2020  Consent Given By: the patient  Position: PRONE  Additional Comments: Vital signs were monitored before and after the  procedure. Patient was prepped and draped in the usual sterile fashion. The correct patient, procedure, and site was verified.   Injection Procedure Details:  Procedure Site One Meds Administered:  Meds ordered this encounter  Medications  . methylPREDNISolone acetate (DEPO-MEDROL) injection 80 mg    Laterality: Bilateral  Location/Site:  L4-L5  Needle size: 22 G  Needle type: Spinal  Needle Placement: Transforaminal  Findings:    -Comments: Excellent flow of contrast along the nerve, nerve root and into the epidural space.  Procedure Details: After squaring off the end-plates to get a true AP view, the C-arm was positioned so that an oblique view of the foramen as noted above was visualized. The target area is just inferior to the "nose of the scotty dog" or sub pedicular. The soft tissues overlying this structure were infiltrated with 2-3 ml. of 1% Lidocaine without Epinephrine.  The spinal needle was inserted toward the target using a "trajectory" view along the fluoroscope beam.  Under AP and lateral visualization, the needle was advanced so it did not puncture dura and was located close the 6 O'Clock position of the pedical in AP tracterory. Biplanar projections were used to confirm position. Aspiration was confirmed to be negative for CSF and/or blood. A 1-2 ml. volume of Isovue-250 was injected and flow of contrast was noted at each level. Radiographs were obtained for documentation purposes.   After attaining the desired flow of contrast documented above, a 0.5 to 1.0 ml test dose of 0.25% Marcaine was injected into each respective transforaminal space.  The patient was observed for 90  seconds post injection.  After no sensory deficits were reported, and normal lower extremity motor function was noted,   the above injectate was administered so that equal amounts of the injectate were placed at each foramen (level) into the transforaminal epidural space.   Additional  Comments:  The patient tolerated the procedure well Dressing: 2 x 2 sterile gauze and Band-Aid    Post-procedure details: Patient was observed during the procedure. Post-procedure instructions were reviewed.  Patient left the clinic in stable condition.      Clinical History: MRI LUMBAR SPINE WITHOUT CONTRAST    TECHNIQUE:  Multiplanar, multisequence MR imaging of the lumbar spine was  performed. No intravenous contrast was administered.    COMPARISON: Are lumbar spine 08/07/2017    FINDINGS:  Segmentation: 5 non rib-bearing lumbar type vertebral bodies are  present. The lowest fully formed vertebral body is L5.    Alignment: Slight retrolisthesis at L1-2 and L2-3 is stable. Grade 1  anterolisthesis L4-5 measures 7 mm, slightly increased. Levoconvex  curvature is centered at L2.    Vertebrae: Progressive sclerotic endplate changes are present on the  right at L2-3. Progressive fatty endplate changes are present on the  right at L1-2. There is progression of mild chronic endplate changes  on the left at L4-5 and L5-S1.    Conus medullaris and cauda equina: Conus extends to the L1 level.  Conus and cauda equina appear normal.    Paraspinal and other soft tissues: Limited imaging the abdomen is  unremarkable. There is no significant adenopathy. No solid organ  lesions are present.    Disc levels:    T12-L1: Negative.    L1-2: A broad-based disc protrusion is present. Moderate facet  hypertrophy is worse on the left. Progressive synovial cyst  contributes to moderate to severe right subarticular stenosis. Mild  left subarticular narrowing is present. Moderate foraminal stenosis  has progressed, worse right than left.    L2-3: A broad-based disc protrusion is present. Moderate facet  hypertrophy is noted bilaterally. Progressive moderate central canal  stenosis is present. Severe left and moderate right subarticular  narrowing is present. Severe left and  moderate right foraminal  stenosis is present.    L3-4: A broad-based disc protrusion is present. Progressive facet  hypertrophy is evident. Facet spurring contributes to moderate  subarticular stenosis, left greater than right. Moderate left and  mild right foraminal stenosis is present.    L4-5: There is uncovering of a broad-based disc protrusion. Advanced  facet hypertrophy is noted. Progressive severe central canal  stenosis is present. Severe left and moderate right foraminal  stenosis is present.    L5-S1: A central disc protrusion is present. Moderate central canal  stenosis has progressed. Severe right and moderate left subarticular  narrowing is present. Moderate foraminal stenosis is present  bilaterally.    IMPRESSION:  1. Progressive multilevel spondylosis of the lumbar spine as  described.  2. Progressive moderate to severe right subarticular and moderate  foraminal stenosis at L1-2.  3. Progressive moderate central canal stenosis at L2-3 with severe  left and moderate right subarticular stenosis.  4. Severe left and moderate right foraminal stenosis at L2-3.  5. Moderate subarticular and left greater than right foraminal  stenosis at L3-4.  6. Progressive severe central canal stenosis and severe left and  moderate right foraminal stenosis at L4-5.  7. Moderate central canal stenosis with severe right and moderate  left subarticular and moderate foraminal stenosis bilaterally at  L5-S1.  Electronically Signed  By: San Morelle M.D.  On: 10/19/2019 08:40     Objective:  VS:  HT:    WT:   BMI:     BP:134/76  HR:80bpm  TEMP: ( )  RESP:  Physical Exam Constitutional:      General: He is not in acute distress.    Appearance: Normal appearance. He is not ill-appearing.  HENT:     Head: Normocephalic and atraumatic.     Right Ear: External ear normal.     Left Ear: External ear normal.  Eyes:     Extraocular Movements: Extraocular  movements intact.  Cardiovascular:     Rate and Rhythm: Normal rate.     Pulses: Normal pulses.  Abdominal:     General: There is no distension.     Palpations: Abdomen is soft.  Musculoskeletal:        General: Swelling present. No tenderness or signs of injury.     Right lower leg: Edema present.     Left lower leg: Edema present.     Comments: Patient has good distal strength without clonus.  Skin:    Findings: No erythema or rash.  Neurological:     General: No focal deficit present.     Mental Status: He is alert and oriented to person, place, and time.     Sensory: Sensory deficit present.     Motor: Weakness present. No abnormal muscle tone.     Coordination: Coordination normal.     Comments: Right foot drop  Psychiatric:        Mood and Affect: Mood normal.        Behavior: Behavior normal.      Imaging: XR C-ARM NO REPORT  Result Date: 06/12/2020 Please see Notes tab for imaging impression.

## 2020-06-13 NOTE — Procedures (Signed)
Lumbosacral Transforaminal Epidural Steroid Injection - Sub-Pedicular Approach with Fluoroscopic Guidance  Patient: [name]      Date of Birth: 09/29/1941 MRN: [id number] PCP: Cari Caraway, MD      Visit Date: 06/12/2020   Universal Protocol:    Date/Time: 06/12/2020  Consent Given By: the patient  Position: PRONE  Additional Comments: Vital signs were monitored before and after the procedure. Patient was prepped and draped in the usual sterile fashion. The correct patient, procedure, and site was verified.   Injection Procedure Details:  Procedure Site One Meds Administered:  Meds ordered this encounter  Medications  . methylPREDNISolone acetate (DEPO-MEDROL) injection 80 mg    Laterality: Bilateral  Location/Site:  L4-L5  Needle size: 22 G  Needle type: Spinal  Needle Placement: Transforaminal  Findings:    -Comments: Excellent flow of contrast along the nerve, nerve root and into the epidural space.  Procedure Details: After squaring off the end-plates to get a true AP view, the C-arm was positioned so that an oblique view of the foramen as noted above was visualized. The target area is just inferior to the "nose of the scotty dog" or sub pedicular. The soft tissues overlying this structure were infiltrated with 2-3 ml. of 1% Lidocaine without Epinephrine.  The spinal needle was inserted toward the target using a "trajectory" view along the fluoroscope beam.  Under AP and lateral visualization, the needle was advanced so it did not puncture dura and was located close the 6 O'Clock position of the pedical in AP tracterory. Biplanar projections were used to confirm position. Aspiration was confirmed to be negative for CSF and/or blood. A 1-2 ml. volume of Isovue-250 was injected and flow of contrast was noted at each level. Radiographs were obtained for documentation purposes.   After attaining the desired flow of contrast documented above, a 0.5 to 1.0 ml  test dose of 0.25% Marcaine was injected into each respective transforaminal space.  The patient was observed for 90 seconds post injection.  After no sensory deficits were reported, and normal lower extremity motor function was noted,   the above injectate was administered so that equal amounts of the injectate were placed at each foramen (level) into the transforaminal epidural space.   Additional Comments:  The patient tolerated the procedure well Dressing: 2 x 2 sterile gauze and Band-Aid    Post-procedure details: Patient was observed during the procedure. Post-procedure instructions were reviewed.  Patient left the clinic in stable condition.

## 2020-06-14 DIAGNOSIS — G5603 Carpal tunnel syndrome, bilateral upper limbs: Secondary | ICD-10-CM | POA: Diagnosis not present

## 2020-06-14 DIAGNOSIS — M25531 Pain in right wrist: Secondary | ICD-10-CM | POA: Diagnosis not present

## 2020-06-14 DIAGNOSIS — M25551 Pain in right hip: Secondary | ICD-10-CM | POA: Diagnosis not present

## 2020-06-14 DIAGNOSIS — M25532 Pain in left wrist: Secondary | ICD-10-CM | POA: Diagnosis not present

## 2020-06-15 ENCOUNTER — Encounter: Payer: Self-pay | Admitting: Cardiovascular Disease

## 2020-06-15 ENCOUNTER — Ambulatory Visit (INDEPENDENT_AMBULATORY_CARE_PROVIDER_SITE_OTHER): Payer: Medicare Other | Admitting: Cardiovascular Disease

## 2020-06-15 ENCOUNTER — Telehealth: Payer: Self-pay | Admitting: Radiology

## 2020-06-15 ENCOUNTER — Other Ambulatory Visit: Payer: Self-pay

## 2020-06-15 VITALS — BP 151/87 | HR 88 | Ht 69.5 in | Wt 262.0 lb

## 2020-06-15 DIAGNOSIS — R002 Palpitations: Secondary | ICD-10-CM

## 2020-06-15 DIAGNOSIS — E782 Mixed hyperlipidemia: Secondary | ICD-10-CM

## 2020-06-15 DIAGNOSIS — R0609 Other forms of dyspnea: Secondary | ICD-10-CM

## 2020-06-15 DIAGNOSIS — Z6838 Body mass index (BMI) 38.0-38.9, adult: Secondary | ICD-10-CM | POA: Diagnosis not present

## 2020-06-15 DIAGNOSIS — R0600 Dyspnea, unspecified: Secondary | ICD-10-CM

## 2020-06-15 DIAGNOSIS — G4730 Sleep apnea, unspecified: Secondary | ICD-10-CM | POA: Diagnosis not present

## 2020-06-15 DIAGNOSIS — E669 Obesity, unspecified: Secondary | ICD-10-CM

## 2020-06-15 DIAGNOSIS — M48062 Spinal stenosis, lumbar region with neurogenic claudication: Secondary | ICD-10-CM | POA: Diagnosis not present

## 2020-06-15 NOTE — Assessment & Plan Note (Signed)
History of hyperlipidemia on statin therapy with lipid profile performed 02/28/2020 revealing total cholesterol 123, LDL 49 HDL 47.

## 2020-06-15 NOTE — Patient Instructions (Addendum)
Medication Instructions:  Your physician recommends that you continue on your current medications as directed. Please refer to the Current Medication list given to you today.  *If you need a refill on your cardiac medications before your next appointment, please call your pharmacy*  Testing: ZIO XT- Long Term Monitor Instructions   Your physician has requested you wear your ZIO patch monitor____14___days.   This is a single patch monitor.  [employer] supplies one patch monitor per enrollment.  Additional stickers are not available.   Please do not apply patch if you will be having a Nuclear Stress Test, Echocardiogram, Cardiac CT, MRI, or Chest Xray during the time frame you would be wearing the monitor. The patch cannot be worn during these tests.  You cannot remove and re-apply the ZIO XT patch monitor.   Your ZIO patch monitor will be sent USPS Priority mail from [facility] directly to your home address. The monitor may also be mailed to a PO BOX if home delivery is not available.   It may take 3-5 days to receive your monitor after you have been enrolled.   Once you have received you monitor, please review enclosed instructions.  Your monitor has already been registered assigning a specific monitor serial # to you.   Applying the monitor   Shave hair from upper left chest.   Hold abrader disc by orange tab.  Rub abrader in 40 strokes over left upper chest as indicated in your monitor instructions.   Clean area with 4 enclosed alcohol pads .  Use all pads to assure are is cleaned thoroughly.  Let dry.   Apply patch as indicated in monitor instructions.  Patch will be place under collarbone on left side of chest with arrow pointing upward.   Rub patch adhesive wings for 2 minutes.Remove white label marked "1".  Remove white label marked "2".  Rub patch adhesive wings for 2 additional minutes.   While looking in a mirror, press and release button in center of patch.  A small  green light will flash 3-4 times .  This will be your only indicator the monitor has been turned on.     Do not shower for the first 24 hours.  You may shower after the first 24 hours.   Press button if you feel a symptom. You will hear a small click.  Record Date, Time and Symptom in the Patient Log Book.   When you are ready to remove patch, follow instructions on last 2 pages of Patient Log Book.  Stick patch monitor onto last page of Patient Log Book.   Place Patient Log Book in [place] box.  Use locking tab on box and tape box closed securely.  The [employer] and [employer] has [employer] on it.  Please place in mailbox as soon as possible.  Your physician should have your test results approximately 7 days after the monitor has been mailed back to [facility].   Call [facility] at [phone] if you have questions regarding your ZIO XT patch monitor.  Call them immediately if you see an orange light blinking on your monitor.   If your monitor falls off in less than 4 days contact our Monitor department at [phone].  If your monitor becomes loose or falls off after 4 days call [employer] at [phone] for suggestions on securing your monitor.    Follow-Up: At [facility], you and your health needs are our priority.  As part of our continuing  mission to provide you with exceptional heart care, we have created designated Provider Care Teams.  These Care Teams include your primary Cardiologist (physician) and Advanced Practice Providers (APPs -  Physician Assistants and Nurse Practitioners) who all work together to provide you with the care you need, when you need it.  We recommend signing up for the patient portal called "MyChart".  Sign up information is provided on this After Visit Summary.  MyChart is used to connect with patients for Virtual Visits (Telemedicine).  Patients are able to view lab/test results, encounter notes, upcoming appointments, etc.  Non-urgent  messages can be sent to your provider as well.   To learn more about what you can do with MyChart, go to [email or web address].    Your next appointment:   6 month(s) with one of the following Advanced Practice Providers on your designated Care Team:    Kerin Ransom, PA-C  [place], [place]  Coletta Memos, [place]  12 month(s) with Dr. Gwenlyn Found     Other Instructions  You have been referred to Dr. Dennard Nip  [facility] [address] [place]. [place], [place] [address] [phone]

## 2020-06-15 NOTE — Addendum Note (Signed)
Addended by: Fidel Levy on: 06/15/2020 10:23 AM   Modules accepted: Orders

## 2020-06-15 NOTE — Assessment & Plan Note (Signed)
History of dyspnea on exertion which is probably multifactorial from obesity, diastolic dysfunction and reactive airways disease.

## 2020-06-15 NOTE — Progress Notes (Signed)
06/15/2020 [name]   10-Apr-1941  [id number]  Primary Physician Cari Caraway, MD Primary Cardiologist: Lorretta Harp MD Lupe Carney, [place]  HPI:  [name] is a 79 y.o.  moderately overweight divorced Caucasian male father of 3 living children (1 daughter committed suicide since I saw him last) who is a patient of Dr. Everlene Farrier and was referred back for evaluation of dyspnea. I last saw him in the office  08/04/2019. His cardiac risk factor profile is notable for mild hyperlipidemia and family history the father who had an open heart surgery in his 45s. Because of chest pain and a mildly abnormal Cardiolite I performed cardiac catheterization on him 8//7/08 which was entirely normal. He does have problems with his back and knee. He's had venous stripping by Dr. Donnetta Hutching for reflux and swelling last year. He's been diagnosed with obstructive sleep apnea where C Pap which is beneficial for him. He does not exercise because of physical limitations regarding his back and knees.  Since I saw him a year ago he continues to be limited by dyspnea as well as joint pain.  His last 2D echo performed 08/10/2019 revealed normal LV systolic function with grade 1 diastolic dysfunction.  He really denies chest pain.  There is no history of tobacco abuse.  He does have very somatic complaints of feeling diaphoretic and having a slow heart rate in the morning but I do not think these are necessarily relevant.   Current Meds  Medication Sig  . B-D 3CC LUER-LOK SYR 21GX1-1/2 21G X 1-1/2" 3 ML MISC USE TO INJECT DEPOTESTOSTERONE.  . budesonide-formoterol (SYMBICORT) 160-4.5 MCG/ACT inhaler Inhale 2 puffs into the lungs 2 (two) times daily.  [name] buPROPion (WELLBUTRIN XL) 300 MG 24 hr tablet TAKE 1 TABLET BY MOUTH DAILY *NEEDS OFFICE VISIT (Patient taking differently: Take 300 mg by mouth daily. )  . calcium carbonate 200 MG capsule Take 200 mg by mouth daily.   . cetirizine (ZYRTEC) 10 MG tablet Take  10 mg by mouth daily.   [name] doxazosin (CARDURA) 8 MG tablet TAKE 1 TABLET BY MOUTH DAILY  . Take at night (Patient taking differently: Take 8 mg by mouth at bedtime. )  . esomeprazole (NEXIUM) 40 MG capsule Take 1 capsule (40 mg total) by mouth 2 (two) times daily before a meal. (Patient taking differently: Take 40 mg by mouth daily as needed (reflux). )  . etodolac (LODINE) 400 MG tablet Take 1 tablet (400 mg total) by mouth 2 (two) times daily as needed. (Patient taking differently: Take 400 mg by mouth daily. )  . fluticasone (FLONASE) 50 MCG/ACT nasal spray Place 2 sprays into both nostrils daily. (Patient taking differently: Place 2 sprays into both nostrils daily as needed for allergies. )  . fluticasone furoate-vilanterol (BREO ELLIPTA) 100-25 MCG/INH AEPB Inhale 1 puff into the lungs daily.  . furosemide (LASIX) 20 MG tablet TAKE 2 TABLETS BY MOUTH ONCE A DAY (Patient taking differently: Take 20 mg by mouth daily. )  . gabapentin (NEURONTIN) 300 MG capsule TAKE 1 TO 2 CAPSULES BY MOUTH AT BEDTIME (Patient taking differently: Take 600 mg by mouth at bedtime. )  . glucosamine-chondroitin 500-400 MG tablet Take 1 tablet by mouth 2 (two) times daily.  [name] HYDROcodone-acetaminophen (NORCO) 5-325 MG tablet 1 po q d prn pain (Patient taking differently: Take 1 tablet by mouth daily. Additional taking in the afternoon as needed)  . ipratropium (ATROVENT) 0.06 % nasal spray Place  1 spray into both nostrils daily as needed for rhinitis.   [name] LORazepam (ATIVAN) 1 MG tablet Take 0.5-1 mg by mouth at bedtime.  . meloxicam (MOBIC) 15 MG tablet Take 0.5-1 tablets (7.5-15 mg total) by mouth daily as needed for pain.  . methocarbamol (ROBAXIN) 500 MG tablet Take 1 tablet (500 mg total) by mouth every 6 (six) hours as needed for muscle spasms. (Patient taking differently: Take 500 mg by mouth in the morning and at bedtime. )  . Multiple Vitamin (MULTIVITAMIN) tablet Take 1 tablet by mouth daily.  [name] NEEDLE, DISP, 21  G 21G X 1-1/2" MISC Use to inject depotestosterone.  . potassium chloride SA (K-DUR,KLOR-CON) 20 MEQ tablet Take 1 tablet (20 mEq total) by mouth 2 (two) times daily.  [name] PROAIR HFA 108 (90 Base) MCG/ACT inhaler Inhale 1-2 puffs into the lungs every 6 (six) hours as needed for wheezing or shortness of breath.  . rosuvastatin (CRESTOR) 5 MG tablet Take 5 mg by mouth daily.  . Syringe, Reusable, 3 ML MISC Use to inject depotestosterone.  . testosterone cypionate (DEPOTESTOTERONE CYPIONATE) 100 MG/ML injection Inject 1 mL (100 mg total) into the muscle every 7 (seven) days. For IM use only  . testosterone cypionate (DEPOTESTOTERONE CYPIONATE) 100 MG/ML injection SMARTSIG:1.5 Milliliter(s) IM Once a Week     No Known Allergies  Social History   Socioeconomic History  . Marital status: Single    Spouse name: Not on file  . Number of children: Not on file  . Years of education: Not on file  . Highest education level: Not on file  Occupational History  . 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 Smoking status: Never Smoker  . Smokeless tobacco: Never Used  Vaping Use  . Vaping Use: Never used  Substance and Sexual Activity  . Alcohol use: No  . Drug use: No  . Sexual activity: Yes    Birth control/protection: Condom  Other Topics Concern  . Not on file  Social History Narrative   ** Merged History Encounter **       Single. Education: Other.    Social Determinants of Health   Financial Resource Strain:   . Difficulty of Paying Living Expenses:   Food Insecurity:   . Worried About Charity fundraiser in the Last Year:   . Arboriculturist in the Last Year:   Transportation Needs:   . Film/video editor (Medical):   [name] Lack of Transportation (Non-Medical):   Physical Activity:   . Days of Exercise per Week:   . Minutes of Exercise per Session:   Stress:   . Feeling of Stress :   Social Connections:   . Frequency of Communication with Friends  and Family:   . Frequency of Social Gatherings with Friends and Family:   . Attends Religious Services:   . Active Member of Clubs or Organizations:   . Attends Archivist Meetings:   [name] Marital Status:   Intimate Partner Violence:   . Fear of Current or Ex-Partner:   . Emotionally Abused:   [name] Physically Abused:   . Sexually Abused:      Review of Systems: General: negative for chills, fever, night sweats or weight changes.  Cardiovascular: negative for chest pain, dyspnea on exertion, edema, orthopnea, palpitations, paroxysmal nocturnal dyspnea or shortness of breath Dermatological: negative for rash Respiratory: negative for cough or wheezing Urologic: negative for hematuria Abdominal: negative for nausea,  vomiting, diarrhea, bright red blood per rectum, melena, or hematemesis Neurologic: negative for visual changes, syncope, or dizziness All other systems reviewed and are otherwise negative except as noted above.    Blood pressure (!) 151/87, pulse 88, height 5' 9.5" (1.765 m), weight 262 lb (118.8 kg), SpO2 97 %.  General appearance: alert and no distress Neck: no adenopathy, no carotid bruit, no JVD, supple, symmetrical, trachea midline and thyroid not enlarged, symmetric, no tenderness/mass/nodules Lungs: clear to auscultation bilaterally Heart: regular rate and rhythm, S1, S2 normal, no murmur, click, rub or gallop Extremities: extremities normal, atraumatic, no cyanosis or edema Pulses: 2+ and symmetric Skin: Skin color, texture, turgor normal. No rashes or lesions Neurologic: Alert and oriented X 3, normal strength and tone. Normal symmetric reflexes. Normal coordination and gait  EKG sinus rhythm at 88 without ST or T wave changes.  I personally reviewed this EKG.  ASSESSMENT AND PLAN:   Hyperlipidemia History of hyperlipidemia on statin therapy with lipid profile performed 02/28/2020 revealing total cholesterol 123, LDL 49 HDL 47.  Sleep apnea History of  obstructive sleep apnea on CPAP which she benefits from  Dyspnea on exertion History of dyspnea on exertion which is probably multifactorial from obesity, diastolic dysfunction and reactive airways disease.      Lorretta Harp MD FACP,FACC,FAHA, [facility] 06/15/2020 10:04 AM

## 2020-06-15 NOTE — Telephone Encounter (Signed)
Enrolled patient for a 14 day Zio monitor to be mailed to patients home.  

## 2020-06-15 NOTE — Assessment & Plan Note (Signed)
History of obstructive sleep apnea on CPAP which she benefits from 

## 2020-06-18 ENCOUNTER — Telehealth: Payer: Self-pay

## 2020-06-18 NOTE — Telephone Encounter (Signed)
   Primary Cardiologist: Quay Burow, MD  Chart reviewed as part of pre-operative protocol coverage. Given past medical history and time since last visit, based on ACC/AHA guidelines, YASSER HEPP would be at acceptable risk for the planned procedure without further cardiovascular testing.   I will route this recommendation to the requesting party via Epic fax function and remove from pre-op pool.  Please call with questions.  Jossie Ng. Lillyauna Jenkinson NP-C    06/18/2020, 3:19 PM [address] Office [phone] Fax [phone]

## 2020-06-18 NOTE — Telephone Encounter (Signed)
   [name] Pre-operative Risk Assessment    Request for surgical clearance:  1. What type of surgery is being performed? L1-2 L2-3 L3-4 Anterolateral lumbar interbody fusion with posterior percutaneous pedicle screw fixation from L1-S1  2. When is this surgery scheduled? TBD  3. What type of clearance is required (medical clearance vs. Pharmacy clearance to hold med vs. Both)? Both  4. Are there any medications that need to be held prior to surgery and how long? None listed  5. Practice name and name of physician performing surgery? Dr. Kristeen Miss   6. What is the office phone number? [phone]   7.   What is the office fax number? [phone]  8.   Anesthesia type (None, local, MAC, general) ? Not listed    [name] 06/18/2020, 1:47 PM  _________________________________________________________________   (provider comments below)

## 2020-06-19 DIAGNOSIS — R269 Unspecified abnormalities of gait and mobility: Secondary | ICD-10-CM | POA: Diagnosis not present

## 2020-06-19 DIAGNOSIS — M545 Low back pain: Secondary | ICD-10-CM | POA: Diagnosis not present

## 2020-06-19 DIAGNOSIS — M25551 Pain in right hip: Secondary | ICD-10-CM | POA: Diagnosis not present

## 2020-06-19 DIAGNOSIS — M6281 Muscle weakness (generalized): Secondary | ICD-10-CM | POA: Diagnosis not present

## 2020-06-21 ENCOUNTER — Encounter (INDEPENDENT_AMBULATORY_CARE_PROVIDER_SITE_OTHER): Payer: Self-pay

## 2020-06-21 ENCOUNTER — Other Ambulatory Visit: Payer: Self-pay | Admitting: Neurological Surgery

## 2020-06-21 ENCOUNTER — Other Ambulatory Visit (HOSPITAL_COMMUNITY): Payer: Self-pay | Admitting: Neurological Surgery

## 2020-06-21 DIAGNOSIS — M48062 Spinal stenosis, lumbar region with neurogenic claudication: Secondary | ICD-10-CM

## 2020-06-27 ENCOUNTER — Telehealth: Payer: Self-pay | Admitting: Cardiovascular Disease

## 2020-06-27 DIAGNOSIS — M545 Low back pain: Secondary | ICD-10-CM | POA: Diagnosis not present

## 2020-06-27 DIAGNOSIS — M6281 Muscle weakness (generalized): Secondary | ICD-10-CM | POA: Diagnosis not present

## 2020-06-27 DIAGNOSIS — M25551 Pain in right hip: Secondary | ICD-10-CM | POA: Diagnosis not present

## 2020-06-27 DIAGNOSIS — R269 Unspecified abnormalities of gait and mobility: Secondary | ICD-10-CM | POA: Diagnosis not present

## 2020-06-27 NOTE — Telephone Encounter (Signed)
Patient is calling in because he states that Dr. Gwenlyn Found referred him to a [facility] Weight [facility] Program at [facility]. He states that it will be $100 for him to go and he does not have that kind of money on hand. He wanted to let Dr. Gwenlyn Found know.

## 2020-06-28 NOTE — Telephone Encounter (Signed)
Thx for letting me know 

## 2020-06-29 NOTE — Telephone Encounter (Signed)
[name], I wasn't sure how to forward the patient's mychart message to you, so I'm hoping this gets it to you.  Just wanted to share as a FYI.  Thank you,  [facility]

## 2020-07-02 ENCOUNTER — Ambulatory Visit: Payer: Medicare Other | Admitting: Orthopedic Surgery

## 2020-07-04 ENCOUNTER — Telehealth: Payer: Self-pay | Admitting: Gastroenterology

## 2020-07-04 DIAGNOSIS — M545 Low back pain: Secondary | ICD-10-CM | POA: Diagnosis not present

## 2020-07-04 DIAGNOSIS — G4733 Obstructive sleep apnea (adult) (pediatric): Secondary | ICD-10-CM | POA: Diagnosis not present

## 2020-07-04 DIAGNOSIS — I872 Venous insufficiency (chronic) (peripheral): Secondary | ICD-10-CM | POA: Diagnosis not present

## 2020-07-04 DIAGNOSIS — K219 Gastro-esophageal reflux disease without esophagitis: Secondary | ICD-10-CM | POA: Diagnosis not present

## 2020-07-04 DIAGNOSIS — R7309 Other abnormal glucose: Secondary | ICD-10-CM | POA: Diagnosis not present

## 2020-07-04 DIAGNOSIS — N1832 Chronic kidney disease, stage 3b: Secondary | ICD-10-CM | POA: Diagnosis not present

## 2020-07-04 DIAGNOSIS — F331 Major depressive disorder, recurrent, moderate: Secondary | ICD-10-CM | POA: Diagnosis not present

## 2020-07-04 DIAGNOSIS — Z01810 Encounter for preprocedural cardiovascular examination: Secondary | ICD-10-CM | POA: Diagnosis not present

## 2020-07-04 DIAGNOSIS — G8929 Other chronic pain: Secondary | ICD-10-CM | POA: Diagnosis not present

## 2020-07-04 DIAGNOSIS — E782 Mixed hyperlipidemia: Secondary | ICD-10-CM | POA: Diagnosis not present

## 2020-07-04 DIAGNOSIS — R0600 Dyspnea, unspecified: Secondary | ICD-10-CM | POA: Diagnosis not present

## 2020-07-04 NOTE — Telephone Encounter (Signed)
Offered pt an appt with PA tomorrow but pt wants to see Dr. Tarri Glenn. Pt scheduled to see Dr. Tarri Glenn 07/20/20@8 :30am. Pt aware of appt,.

## 2020-07-04 NOTE — Telephone Encounter (Signed)
Pt states that he has been having lower abd pain on the right side of his abdomen. Pain has been going on for a while but it has worsened since a month ago. He would some advise.

## 2020-07-04 NOTE — Telephone Encounter (Signed)
Left message for pt to call back  °

## 2020-07-05 ENCOUNTER — Other Ambulatory Visit: Payer: Self-pay

## 2020-07-05 ENCOUNTER — Ambulatory Visit (HOSPITAL_COMMUNITY)
Admission: RE | Admit: 2020-07-05 | Discharge: 2020-07-05 | Disposition: A | Discharge status: Home / Self Care | Payer: Medicare Other | Source: Ambulatory Visit | Attending: Neurological Surgery | Admitting: Neurological Surgery

## 2020-07-05 DIAGNOSIS — M47816 Spondylosis without myelopathy or radiculopathy, lumbar region: Secondary | ICD-10-CM | POA: Diagnosis not present

## 2020-07-05 DIAGNOSIS — Z01818 Encounter for other preprocedural examination: Secondary | ICD-10-CM | POA: Diagnosis not present

## 2020-07-05 DIAGNOSIS — M4186 Other forms of scoliosis, lumbar region: Secondary | ICD-10-CM | POA: Diagnosis not present

## 2020-07-05 DIAGNOSIS — M48062 Spinal stenosis, lumbar region with neurogenic claudication: Secondary | ICD-10-CM | POA: Diagnosis not present

## 2020-07-05 DIAGNOSIS — M4316 Spondylolisthesis, lumbar region: Secondary | ICD-10-CM | POA: Diagnosis not present

## 2020-07-09 DIAGNOSIS — R269 Unspecified abnormalities of gait and mobility: Secondary | ICD-10-CM | POA: Diagnosis not present

## 2020-07-09 DIAGNOSIS — M25551 Pain in right hip: Secondary | ICD-10-CM | POA: Diagnosis not present

## 2020-07-09 DIAGNOSIS — M545 Low back pain: Secondary | ICD-10-CM | POA: Diagnosis not present

## 2020-07-09 DIAGNOSIS — M6281 Muscle weakness (generalized): Secondary | ICD-10-CM | POA: Diagnosis not present

## 2020-07-09 DIAGNOSIS — G4733 Obstructive sleep apnea (adult) (pediatric): Secondary | ICD-10-CM | POA: Diagnosis not present

## 2020-07-11 ENCOUNTER — Telehealth: Payer: Self-pay | Admitting: Pulmonary Disease

## 2020-07-11 NOTE — Telephone Encounter (Signed)
Surgical form received, signed by Dr. Vaughan Browner on 07/10/20.  Attempted to fax x2 to [phone], received fax call report, states no answer.  I call this morning and verified that the fax # is correct, given [phone] to fax the surgical clearance form to attention Jessica. Received fax call report, verified successful transmission.

## 2020-07-18 ENCOUNTER — Encounter (INDEPENDENT_AMBULATORY_CARE_PROVIDER_SITE_OTHER): Payer: Self-pay

## 2020-07-18 DIAGNOSIS — M5416 Radiculopathy, lumbar region: Secondary | ICD-10-CM | POA: Diagnosis not present

## 2020-07-18 DIAGNOSIS — M48062 Spinal stenosis, lumbar region with neurogenic claudication: Secondary | ICD-10-CM | POA: Diagnosis not present

## 2020-07-20 ENCOUNTER — Telehealth: Payer: Self-pay | Admitting: Physical Medicine and Rehabilitation

## 2020-07-20 ENCOUNTER — Ambulatory Visit (INDEPENDENT_AMBULATORY_CARE_PROVIDER_SITE_OTHER): Payer: Medicare Other | Admitting: Physical Medicine and Rehabilitation

## 2020-07-20 ENCOUNTER — Ambulatory Visit (INDEPENDENT_AMBULATORY_CARE_PROVIDER_SITE_OTHER): Payer: Medicare Other | Admitting: Gastroenterology

## 2020-07-20 ENCOUNTER — Encounter: Payer: Self-pay | Admitting: Physical Medicine and Rehabilitation

## 2020-07-20 ENCOUNTER — Encounter: Payer: Self-pay | Admitting: Gastroenterology

## 2020-07-20 ENCOUNTER — Other Ambulatory Visit: Payer: Self-pay

## 2020-07-20 VITALS — BP 154/80 | HR 76 | Ht 67.75 in | Wt 254.5 lb

## 2020-07-20 DIAGNOSIS — K219 Gastro-esophageal reflux disease without esophagitis: Secondary | ICD-10-CM | POA: Diagnosis not present

## 2020-07-20 DIAGNOSIS — D509 Iron deficiency anemia, unspecified: Secondary | ICD-10-CM

## 2020-07-20 DIAGNOSIS — R1031 Right lower quadrant pain: Secondary | ICD-10-CM

## 2020-07-20 DIAGNOSIS — R194 Change in bowel habit: Secondary | ICD-10-CM | POA: Diagnosis not present

## 2020-07-20 DIAGNOSIS — R202 Paresthesia of skin: Secondary | ICD-10-CM | POA: Diagnosis not present

## 2020-07-20 MED ORDER — NA SULFATE-K SULFATE-MG SULF 17.5-3.13-1.6 GM/177ML PO SOLN: 1.0000 | Freq: Once | ORAL | 0 refills | Status: AC

## 2020-07-20 NOTE — Telephone Encounter (Signed)
Patient called requesting an immedicate call back from [place]. Patient phone number is [phone].

## 2020-07-20 NOTE — Progress Notes (Signed)
Referring Provider: Cari Caraway, MD Primary Care Physician:  Cari Caraway, MD  Chief complaint: Abdominal pain, change in bowel habits   IMPRESSION:  RLQ abdominal pain with altered bowel habits - worse over the last month    - Colonoscopy recommended to evaluate for organic GI disease    - Symptoms may be related to referred lumbar impingement Blood in the stool without associated anemia, labs consistent with iron deficiency    - no obvious source identified on review of colonoscopy from 2018    - add a daily stool bulking agent such as Metamucil    - colonoscopy and EGD recommended Dyspepsia presenting with eructation, early satiety, flatus, post-prandial bloating, and epigastric pain    - ? Related to GERD or a concurrent process    - Improved on increased PPI therapy for possible GERD    - plan EGD for further evaluation    - consider a trial of FDGard while evaluation for organic etiologies is underway GERD with ongoing symptoms despite daily Nexium and Carafate    - increased Nexium to 40 mg twice daily    - increased Carafate to QID    - reviewed lifestyle modifications, not lying down immediately after dinner may improve his symptoms    - plan EGD to reassess for disease activity Fatigue    - etiology of fatigue is unclear, ? Related to GI symptoms    - consider celiac - will obtain duodenal biopsies on EGD Sigmoid diverticulosis History of colon polyps    - tubular adenoma and SSA on colonoscopy with Dr. Benson Norway 2018    -Dr. Benson Norway recommended surveillance in 5 years No known family history of colon cancer or polyps      PLAN: - Continue Nexium to 40 mg twice daily - May use Carafate 1 g QID and/or TUMS to control his symptoms - Add a daily dose of Metamucil for stool bulking - Iron, ferritin - Colonoscopy - EGD - Abdominal ultrasound or contrasted CT scan if endoscopic evaluation is negative - Information provided today regarding possible transportation  options as this is what prevented him from proceeding with endoscopy earlier this year     HPI: [name] is a 79 y.o. male self-referred for the "gut" and initially seen in consultation 12/15/19. Endoscopic evaluation was recommended at that time. The patient returns today in follow-up. He presented late after going to the pulmonary office first thinking that his appointment was there. The interval history is obtained to the patient and review of his electronic health record. He has sleep apnea on CPAP, allergic rhinitis, prediabetes, hypertension, obesity, diastolic dysfunction, reactive airways disease,  hyperlipidemia and GERD.  Umbilical hernia repair with Dr. Zigmund Daniel 2017. Takes testosterone.  Previously followed by Dr. Benson Norway and Dr. Paulita Fujita. Wanted to transfer care to a new gastroenterologist. Retired [employer]. Attended [name] in [name], [name].  Currently works with [name].  He has been followed by Dr. Benson Norway for several years with his last visit 05/04/2019. EGD with Dr. Benson Norway for dysphagia 05/29/2015 showed a normal esophagus including esophageal biopsies.  Normal stomach and duodenum.  Impedance pH and manometry testing recommended but never scheduled. At that time of his most recent office visit 05/04/19 he was reporting reflux, abdominal distention with a firm abdomen, and fatigue.  His symptoms were previously controlled on Nexium alternating with omeprazole.  Dr. Benson Norway added Carafate 1 g tablets 4 times daily.  Dr. Benson Norway noted that he had gained 25 pounds since  their initial consultation.  Saw Dr. Paulita Fujita in 2020, although those records are not available to me.   History of diarrhea that improved with VSL #3.  History of colon polyps previously followed by Dr. Benson Norway.  Colonoscopy with Dr. Benson Norway 12/09/16: sigmoid diverticulosis, a small tubular adenoma and a small sessile serrated adenoma.  Surveillance colonoscopy recommended in 5 years.  Concerns at the time of his initial  visit included reflux for years, but recently more severe over the last 6-8 months as well as eructation, early satiety, flatus, bloating, and epigastric pain.  Symptoms occur within minutes of eating. Has identified some foods that are triggers but he is unable to name them.   He was previously on Nexium. One year ago he was prescribed Carafate. He will use it QD-BID for symptom control.  Has never used it QID. Has not tired any other OTC meds, herbal therapies, acupunture, Chinese therapies, or diet changes to manage his symptoms. Associated shortness of breath and poor energy.  Fatigue had him particularly concerned. Appetite normal. Weight stable.  Reports seeing blood in the stool recently. Not with every BM but seen every 3-5 days. Colonoscopy and EGD recommending as well as abdominal ultrasound if endoscopy was negative. These studies were not performed because he does not have a driver who can stay and wait during the procedure.   Returns today with a history of chronic RLQ pain that is worse over the last month, now with altered bowel habits. Bloating, flatus, eructation, and early satiety has improved on the high dose of Nexium. He notes a temporal association with stress. He has several friends from high school who have been dying.  He has unintentionally gained weight and is looking forward to working with [facility].   Labs 12/05/19: iron 95, ferritin 15.4 Labs 03/14/20: hemoglobin 16 No recent abdominal imaging CT lumbar spine 07/05/20: 1. 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 Neural impingement at each lumbar level as described above and demonstrated by myelography earlier this year. 3. T9-T12 spondylitic ankylosis.  Limited knowledge about family history. No known family history of colon cancer or polyps. No family history of uterine/endometrial cancer, pancreatic cancer or gastric/stomach cancer.   Past Medical History:    Diagnosis Date  . Allergy    takes Zyrtec daily  . Anxiety    takes Ativan daily as needed  . Arthritis   . Asthma   . Back pain   . BPH (benign prostatic hyperplasia)    takes doxazosin for  . 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 Depression    takes Wellbutrin daily  . Dyspnea on exertion    with exertion  . GERD (gastroesophageal reflux disease)    takes Nexium and Omeprazole daily  . History of kidney stones   . 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 Hyperlipidemia   . Neuromuscular disorder ([place])   . Prediabetes   . Sleep apnea   . Swelling of lower extremity    right more than leg leg  . Umbilical hernia     Past Surgical History:  Procedure Laterality Date  . ABDOMINAL AORTOGRAM W/LOWER EXTREMITY N/A 03/14/2020   Procedure: ABDOMINAL AORTOGRAM W/LOWER EXTREMITY;  Surgeon: Serafina Mitchell, MD;  Location: [facility];  Service: Vascular;  Laterality: N/A;  . 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 BACK SURGERY  2003  L4, L5  . BACK SURGERY    . CATARACT EXTRACTION, BILATERAL    . ENDOVENOUS ABLATION SAPHENOUS VEIN W/ LASER Right 06-01-2014   EVLA right greater saphenous vein by Curt Jews MD   . epidural steroid injections        [facility] dr [place]ton  . hernia repair  2003  . HERNIA REPAIR     hernia inguinal x 2  . TOTAL KNEE ARTHROPLASTY Left 03/22/2015   Procedure: TOTAL KNEE ARTHROPLASTY;  Surgeon: Meredith Pel, MD;  Location: [facility];  Service: Orthopedics;  Laterality: Left;  . UMBILICAL HERNIA REPAIR N/A 06/27/2016   Procedure: LAPAROSCOPIC ASSISTED REPAIR OF UMBILICAL HERINA WITH MESH;  Surgeon: Johnathan Hausen, MD;  Location: [facility];  Service: General;  Laterality: N/A;  . VASCULAR SURGERY  2015   right leg    Current Outpatient Medications  Medication Sig Dispense Refill  . B-D 3CC LUER-LOK SYR 21GX1-1/2 21G X 1-1/2" 3 ML MISC USE TO INJECT DEPOTESTOSTERONE. 20 each 0  .  budesonide-formoterol (SYMBICORT) 160-4.5 MCG/ACT inhaler Inhale 2 puffs into the lungs 2 (two) times daily. 1 Inhaler 3  . buPROPion (WELLBUTRIN XL) 300 MG 24 hr tablet TAKE 1 TABLET BY MOUTH DAILY *NEEDS OFFICE VISIT (Patient taking differently: Take 300 mg by mouth daily. ) 30 tablet 11  . calcium carbonate 200 MG capsule Take 200 mg by mouth daily.     . cetirizine (ZYRTEC) 10 MG tablet Take 10 mg by mouth daily.     [name] doxazosin (CARDURA) 8 MG tablet TAKE 1 TABLET BY MOUTH DAILY  . Take at night (Patient taking differently: Take 8 mg by mouth at bedtime. ) 30 tablet 11  . esomeprazole (NEXIUM) 40 MG capsule Take 1 capsule (40 mg total) by mouth 2 (two) times daily before a meal. (Patient taking differently: Take 40 mg by mouth daily as needed (reflux). ) 60 capsule 3  . etodolac (LODINE) 400 MG tablet Take 1 tablet (400 mg total) by mouth 2 (two) times daily as needed. (Patient taking differently: Take 400 mg by mouth daily. ) 60 tablet 6  . fluticasone (FLONASE) 50 MCG/ACT nasal spray Place 2 sprays into both nostrils daily. (Patient taking differently: Place 2 sprays into both nostrils daily as needed for allergies. ) 16 g 3  . fluticasone furoate-vilanterol (BREO ELLIPTA) 100-25 MCG/INH AEPB Inhale 1 puff into the lungs daily. 28 each 0  . furosemide (LASIX) 20 MG tablet TAKE 2 TABLETS BY MOUTH ONCE A DAY (Patient taking differently: Take 20 mg by mouth daily. ) 60 tablet 0  . gabapentin (NEURONTIN) 300 MG capsule TAKE 1 TO 2 CAPSULES BY MOUTH AT BEDTIME (Patient taking differently: Take 600 mg by mouth at bedtime. ) 60 capsule 0  . glucosamine-chondroitin 500-400 MG tablet Take 1 tablet by mouth 2 (two) times daily.    [name] HYDROcodone-acetaminophen (NORCO) 5-325 MG tablet 1 po q d prn pain (Patient taking differently: Take 1 tablet by mouth daily. Additional taking in the afternoon as needed) 45 tablet 0  . ipratropium (ATROVENT) 0.06 % nasal spray Place 1 spray into both nostrils daily as needed  for rhinitis.     [name] LORazepam (ATIVAN) 1 MG tablet Take 0.5-1 mg by mouth at bedtime.    . meloxicam (MOBIC) 15 MG tablet Take 0.5-1 tablets (7.5-15 mg total) by mouth daily as needed for pain. 90 tablet 3  . methocarbamol (ROBAXIN) 500 MG tablet Take 1 tablet (500 mg total) by mouth every  6 (six) hours as needed for muscle spasms. (Patient taking differently: Take 500 mg by mouth in the morning and at bedtime. ) 60 tablet 3  . Multiple Vitamin (MULTIVITAMIN) tablet Take 1 tablet by mouth daily.    [name] NEEDLE, DISP, 21 G 21G X 1-1/2" MISC Use to inject depotestosterone. 50 each 0  . potassium chloride SA (K-DUR,KLOR-CON) 20 MEQ tablet Take 1 tablet (20 mEq total) by mouth 2 (two) times daily. 18 tablet 3  . PROAIR HFA 108 (90 Base) MCG/ACT inhaler Inhale 1-2 puffs into the lungs every 6 (six) hours as needed for wheezing or shortness of breath. 8 g 6  . rosuvastatin (CRESTOR) 5 MG tablet Take 5 mg by mouth daily.    . Syringe, Reusable, 3 ML MISC Use to inject depotestosterone. 1 each 0  . testosterone cypionate (DEPOTESTOTERONE CYPIONATE) 100 MG/ML injection Inject 1 mL (100 mg total) into the muscle every 7 (seven) days. For IM use only 10 mL 1  . testosterone cypionate (DEPOTESTOTERONE CYPIONATE) 100 MG/ML injection SMARTSIG:1.5 Milliliter(s) IM Once a Week     No current facility-administered medications for this visit.    Allergies as of 07/20/2020  . (No Known Allergies)    Family History  Problem Relation Age of Onset  . Thyroid disease Mother   . Heart disease Father   . Diabetes Maternal Grandfather   . Colon cancer Neg Hx   . Esophageal cancer Neg Hx     Social History   Socioeconomic History  . Marital status: Single    Spouse name: Not on file  . Number of children: Not on file  . Years of education: Not on file  . Highest education level: Not on file  Occupational History  . 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 Smoking status: Never  Smoker  . Smokeless tobacco: Never Used  Vaping Use  . Vaping Use: Never used  Substance and Sexual Activity  . Alcohol use: No  . Drug use: No  . Sexual activity: Yes    Birth control/protection: Condom  Other Topics Concern  . Not on file  Social History Narrative   ** Merged History Encounter **       Single. Education: Other.    Social Determinants of Health   Financial Resource Strain:   . Difficulty of Paying Living Expenses: Not on file  Food Insecurity:   . Worried About Charity fundraiser in the Last Year: Not on file  . Ran Out of Food in the Last Year: Not on file  Transportation Needs:   . Lack of Transportation (Medical): Not on file  . Lack of Transportation (Non-Medical): Not on file  Physical Activity:   . Days of Exercise per Week: Not on file  . Minutes of Exercise per Session: Not on file  Stress:   . Feeling of Stress : Not on file  Social Connections:   . Frequency of Communication with Friends and Family: Not on file  . Frequency of Social Gatherings with Friends and Family: Not on file  . Attends Religious Services: Not on file  . Active Member of Clubs or Organizations: Not on file  . Attends Archivist Meetings: Not on file  . Marital Status: Not on file  Intimate Partner Violence:   . Fear of Current or Ex-Partner: Not on file  . Emotionally Abused: Not on file  . Physically Abused: Not on file  . Sexually Abused:  Not on file     Physical Exam: General:   Alert,  well-nourished, pleasant and cooperative in NAD Head:  Normocephalic and atraumatic. Eyes:  Sclera clear, no icterus.   Conjunctiva pink. Ears:  Normal auditory acuity. Abdomen:  Soft, protuberent, central obesity, nontender, nondistended, normal bowel sounds, no rebound or guarding. No hepatosplenomegaly.   Msk:  Symmetrical. No boney deformities LAD: No inguinal or umbilical LAD Extremities:  No clubbing or edema. Neurologic:  Alert and  oriented x4;  grossly  nonfocal Skin:  Intact without significant lesions or rashes. Psych:  Alert and cooperative. Normal mood and affect.    Anevay Campanella L. Tarri Glenn, MD, MPH 07/20/2020, 8:40 AM

## 2020-07-20 NOTE — Progress Notes (Signed)
Left hand constantly numb. Very little sensations. Can't feel keys to type. Pain in lft hand sometimes. Sometimes has symptoms on right late in the day. Right hand dominant Numeric Pain Rating Scale and Functional Assessment Average Pain 8   In the last MONTH (on 0-10 scale) has pain interfered with the following?  1. General activity like being  able to carry out your everyday physical activities such as walking, climbing stairs, carrying groceries, or moving a chair?  Rating(9)

## 2020-07-20 NOTE — Patient Instructions (Addendum)
Please use Metamucil at least once daily to try to even out your bowel habits.  Continue to take Nexium and Tums to control your gas and bloating.  I have recommended endoscopy. Hopefully we can identify transportation so that you can have this procedure performed.   If you are age 79 or older, your body mass index should be between 23-30. Your Body mass index is 38.98 kg/m. If this is out of the aforementioned range listed, please consider follow up with your Primary Care Provider.  If you are age 80 or younger, your body mass index should be between 19-25. Your Body mass index is 38.98 kg/m. If this is out of the aformentioned range listed, please consider follow up with your Primary Care Provider.   You have been scheduled for a colonoscopy. Please follow written instructions given to you at your visit today.  Please pick up your prep supplies at the pharmacy within the next 1-3 days. If you use inhalers (even only as needed), please bring them with you on the day of your procedure.  We have sent the following medications to your pharmacy for you to pick up at your convenience: Suprep  It was a pleasure to see you today!  Dr. Tarri Glenn

## 2020-07-24 NOTE — Procedures (Signed)
EMG & NCV Findings: Evaluation of the left median motor and the right median motor nerves showed prolonged distal onset latency (L4.4, R6.7 ms), reduced amplitude (L0.2, R2.3 mV), and decreased conduction velocity (Elbow-Wrist, L30, R48 m/s).  The left median (across palm) sensory nerve showed no response (Wrist) and no response (Palm).  The right median (across palm) sensory nerve showed no response (Palm) and prolonged distal peak latency (4.6 ms).  The left ulnar sensory nerve showed no response (Wrist).  The right ulnar sensory nerve showed prolonged distal peak latency (6.9 ms), reduced amplitude (6.9 V), and decreased conduction velocity (Wrist-5th Digit, 20 m/s).  All remaining nerves (as indicated in the following tables) were within normal limits.  Left vs. Right side comparison data for the median motor nerve indicates abnormal L-R latency difference (2.3 ms), abnormal L-R amplitude difference (91.3 %), and abnormal L-R velocity difference (Elbow-Wrist, 18 m/s).  All remaining left vs. right side differences were within normal limits.    Needle evaluation of the left abductor pollicis brevis muscle showed increased insertional activity, increased spontaneous activity, and diminished recruitment.  All remaining muscles (as indicated in the following table) showed no evidence of electrical instability.    Impression: The above electrodiagnostic study is ABNORMAL and reveals evidence of  1.  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 The lesion is characterized by sensory and motor demyelination with evidence of significant axonal injury.   2. 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  No significant evidence of other focal nerve neuropathy, brachial plexopathy or cervical radiculopathy.  Recommendations: 1.  Follow-up with referring physician. 2.   Continue current management of symptoms. 3.  Suggest surgical evaluation.  ___________________________ Laurence Spates FAAPMR Board Certified, American Board of Physical Medicine and Rehabilitation    Nerve Conduction Studies Anti Sensory Summary Table   Stim Site NR Peak (ms) Norm Peak (ms) P-T Amp (V) Norm P-T Amp Site1 Site2 Delta-P (ms) Dist (cm) Vel (m/s) Norm Vel (m/s)  Left Median Acr Palm Anti Sensory (2nd Digit)  32.3C  Wrist *NR  <3.6  >10 Wrist Palm  0.0    Palm *NR  <2.0          Right Median Acr Palm Anti Sensory (2nd Digit)  30.3C  Wrist    *4.6 <3.6 14.4 >10 Wrist Palm  0.0    Palm *NR  <2.0          Left Radial Anti Sensory (Base 1st Digit)  31.5C  Wrist    2.5 <3.1 13.4  Wrist Base 1st Digit 2.5 0.0    Right Radial Anti Sensory (Base 1st Digit)  30.4C  Wrist    2.5 <3.1 8.3  Wrist Base 1st Digit 2.5 0.0    Left Ulnar Anti Sensory (5th Digit)  32.4C  Wrist *NR  <3.7  >15.0 Wrist 5th Digit  14.0  >38  Right Ulnar Anti Sensory (5th Digit)  30.6C  Wrist    *6.9 <3.7 *6.9 >15.0 Wrist 5th Digit 6.9 14.0 *20 >38   Motor Summary Table   Stim Site NR Onset (ms) Norm Onset (ms) O-P Amp (mV) Norm O-P Amp Site1 Site2 Delta-0 (ms) Dist (cm) Vel (m/s) Norm Vel (m/s)  Left Median Motor (Abd Poll Brev)  32C  Wrist    *4.4 <4.2 *0.2 >5 Elbow Wrist 7.4 22.0 *30 >50  Elbow    11.8  0.1  Right Median Motor (Abd Poll Brev)  30.5C  Wrist    *6.7 <4.2 *2.3 >5 Elbow Wrist 4.9 23.5 *48 >50  Elbow    11.6  1.2         Left Ulnar Motor (Abd Dig Min)  32.1C  Wrist    3.4 <4.2 8.4 >3 B Elbow Wrist 3.9 21.5 55 >53  B Elbow    7.3  6.4  A Elbow B Elbow 1.7 10.0 59 >53  A Elbow    9.0  6.7         Right Ulnar Motor (Abd Dig Min)  31.7C  Wrist    3.2 <4.2 8.9 >3 B Elbow Wrist 3.9 21.0 54 >53  B Elbow    7.1  8.2  A Elbow B Elbow 1.7 10.0 59 >53  A Elbow    8.8  7.3          EMG   Side Muscle Nerve Root Ins Act Fibs Psw Amp Dur Poly Recrt Int Fraser Din Comment  Left Abd Poll  Brev Median C8-T1 *Incr *3+ *3+ Nml Nml 0 *Reduced Nml   Left 1stDorInt Ulnar C8-T1 Nml Nml Nml Nml Nml 0 Nml Nml   Left PronatorTeres Median C6-7 Nml Nml Nml Nml Nml 0 Nml Nml   Left Biceps Musculocut C5-6 Nml Nml Nml Nml Nml 0 Nml Nml   Left Deltoid Axillary C5-6 Nml Nml Nml Nml Nml 0 Nml Nml     Nerve Conduction Studies Anti Sensory Left/Right Comparison   Stim Site L Lat (ms) R Lat (ms) L-R Lat (ms) L Amp (V) R Amp (V) L-R Amp (%) Site1 Site2 L Vel (m/s) R Vel (m/s) L-R Vel (m/s)  Median Acr Palm Anti Sensory (2nd Digit)  32.3C  Wrist  *4.6   14.4  Wrist Palm     Palm             Radial Anti Sensory (Base 1st Digit)  31.5C  Wrist 2.5 2.5 0.0 13.4 8.3 38.1 Wrist Base 1st Digit     Ulnar Anti Sensory (5th Digit)  32.4C  Wrist  *6.9   *6.9  Wrist 5th Digit  20    Motor Left/Right Comparison   Stim Site L Lat (ms) R Lat (ms) L-R Lat (ms) L Amp (mV) R Amp (mV) L-R Amp (%) Site1 Site2 L Vel (m/s) R Vel (m/s) L-R Vel (m/s)  Median Motor (Abd Poll Brev)  32C  Wrist *4.4 *6.7 *2.3 *0.2 *2.3 *91.3 Elbow Wrist *30 *48 *18  Elbow 11.8 11.6 0.2 0.1 1.2 91.7       Ulnar Motor (Abd Dig Min)  32.1C  Wrist 3.4 3.2 0.2 8.4 8.9 5.6 B Elbow Wrist 55 54 1  B Elbow 7.3 7.1 0.2 6.4 8.2 22.0 A Elbow B Elbow 59 59 0  A Elbow 9.0 8.8 0.2 6.7 7.3 8.2          Waveforms:

## 2020-07-24 NOTE — Progress Notes (Signed)
[name] - 79 y.o. male MRN [id number]  Date of birth: 03-29-1941  Office Visit Note: Visit Date: 07/20/2020 PCP: Cari Caraway, MD Referred by: Cari Caraway, MD  Subjective: Chief Complaint  Patient presents with  . Left Hand - Numbness, Pain  . Right Hand - Numbness   HPI: [name] is a 79 y.o. male who comes in today at the request of Dr. Meridee Score for electrodiagnostic study of the Bilateral upper extremities.  Patient is Right hand dominant.  He reports that the left hand is constantly numb at this point in predominantly the radial digits but can feel like the whole hand.  He reports that there is very little sensation at this point and he really cannot feel the keys to try to type on the keyboard.  He has had this off and on but never sticking with him quite as long.  He gets similar symptoms on the right but just not as bad.  The symptoms on the right can occur later in the day.  He has had prior electrodiagnostic study in 2014 by Dr. Margette Fast at [facility] that did show moderate median nerve neuropathy on the right with perhaps underlying mild C6 radiculopathy..  He does have some neck pain but no frank radicular pain in the arms.   ROS Otherwise per HPI.  Assessment & Plan: Visit Diagnoses:  1. Paresthesia of skin     Plan: Impression: The above electrodiagnostic study is ABNORMAL and reveals evidence of  1.  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 The lesion is characterized by sensory and motor demyelination with evidence of significant axonal injury.   2. 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  No significant evidence of other focal nerve neuropathy, brachial plexopathy or cervical radiculopathy.  Recommendations: 1.  Follow-up with referring physician. 2.  Continue current  management of symptoms. 3.  Suggest surgical evaluation.  Meds & Orders: No orders of the defined types were placed in this encounter.   Orders Placed This Encounter  Procedures  . NCV with EMG (electromyography)    Follow-up: Return for  Meridee Score, M.D..   Procedures: No procedures performed  EMG & NCV Findings: Evaluation of the left median motor and the right median motor nerves showed prolonged distal onset latency (L4.4, R6.7 ms), reduced amplitude (L0.2, R2.3 mV), and decreased conduction velocity (Elbow-Wrist, L30, R48 m/s).  The left median (across palm) sensory nerve showed no response (Wrist) and no response (Palm).  The right median (across palm) sensory nerve showed no response (Palm) and prolonged distal peak latency (4.6 ms).  The left ulnar sensory nerve showed no response (Wrist).  The right ulnar sensory nerve showed prolonged distal peak latency (6.9 ms), reduced amplitude (6.9 V), and decreased conduction velocity (Wrist-5th Digit, 20 m/s).  All remaining nerves (as indicated in the following tables) were within normal limits.  Left vs. Right side comparison data for the median motor nerve indicates abnormal L-R latency difference (2.3 ms), abnormal L-R amplitude difference (91.3 %), and abnormal L-R velocity difference (Elbow-Wrist, 18 m/s).  All remaining left vs. right side differences were within normal limits.    Needle evaluation of the left abductor pollicis brevis muscle showed increased insertional activity, increased spontaneous activity, and diminished recruitment.  All remaining muscles (as indicated in the following table) showed no evidence of electrical instability.  Impression: The above electrodiagnostic study is ABNORMAL and reveals evidence of  1.  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 The lesion is characterized by sensory and motor demyelination  with evidence of significant axonal injury.   2. 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  No significant evidence of other focal nerve neuropathy, brachial plexopathy or cervical radiculopathy.  Recommendations: 1.  Follow-up with referring physician. 2.  Continue current management of symptoms. 3.  Suggest surgical evaluation.  ___________________________ Laurence Spates FAAPMR Board Certified, American Board of Physical Medicine and Rehabilitation    Nerve Conduction Studies Anti Sensory Summary Table   Stim Site NR Peak (ms) Norm Peak (ms) P-T Amp (V) Norm P-T Amp Site1 Site2 Delta-P (ms) Dist (cm) Vel (m/s) Norm Vel (m/s)  Left Median Acr Palm Anti Sensory (2nd Digit)  32.3C  Wrist *NR  <3.6  >10 Wrist Palm  0.0    Palm *NR  <2.0          Right Median Acr Palm Anti Sensory (2nd Digit)  30.3C  Wrist    *4.6 <3.6 14.4 >10 Wrist Palm  0.0    Palm *NR  <2.0          Left Radial Anti Sensory (Base 1st Digit)  31.5C  Wrist    2.5 <3.1 13.4  Wrist Base 1st Digit 2.5 0.0    Right Radial Anti Sensory (Base 1st Digit)  30.4C  Wrist    2.5 <3.1 8.3  Wrist Base 1st Digit 2.5 0.0    Left Ulnar Anti Sensory (5th Digit)  32.4C  Wrist *NR  <3.7  >15.0 Wrist 5th Digit  14.0  >38  Right Ulnar Anti Sensory (5th Digit)  30.6C  Wrist    *6.9 <3.7 *6.9 >15.0 Wrist 5th Digit 6.9 14.0 *20 >38   Motor Summary Table   Stim Site NR Onset (ms) Norm Onset (ms) O-P Amp (mV) Norm O-P Amp Site1 Site2 Delta-0 (ms) Dist (cm) Vel (m/s) Norm Vel (m/s)  Left Median Motor (Abd Poll Brev)  32C  Wrist    *4.4 <4.2 *0.2 >5 Elbow Wrist 7.4 22.0 *30 >50  Elbow    11.8  0.1         Right Median Motor (Abd Poll Brev)  30.5C  Wrist    *6.7 <4.2 *2.3 >5 Elbow Wrist 4.9 23.5 *48 >50  Elbow    11.6  1.2         Left Ulnar Motor (Abd Dig Min)  32.1C  Wrist    3.4 <4.2 8.4 >3 B Elbow Wrist 3.9 21.5 55 >53  B Elbow    7.3  6.4  A Elbow B Elbow 1.7 10.0 59 >53  A  Elbow    9.0  6.7         Right Ulnar Motor (Abd Dig Min)  31.7C  Wrist    3.2 <4.2 8.9 >3 B Elbow Wrist 3.9 21.0 54 >53  B Elbow    7.1  8.2  A Elbow B Elbow 1.7 10.0 59 >53  A Elbow    8.8  7.3          EMG   Side Muscle Nerve Root Ins Act Fibs Psw Amp Dur Poly Recrt Int Fraser Din Comment  Left Abd Poll Brev Median C8-T1 *Incr *3+ *3+ Nml Nml 0 *Reduced Nml   Left 1stDorInt Ulnar C8-T1 Nml Nml Nml Nml  Nml 0 Nml Nml   Left PronatorTeres Median C6-7 Nml Nml Nml Nml Nml 0 Nml Nml   Left Biceps Musculocut C5-6 Nml Nml Nml Nml Nml 0 Nml Nml   Left Deltoid Axillary C5-6 Nml Nml Nml Nml Nml 0 Nml Nml     Nerve Conduction Studies Anti Sensory Left/Right Comparison   Stim Site L Lat (ms) R Lat (ms) L-R Lat (ms) L Amp (V) R Amp (V) L-R Amp (%) Site1 Site2 L Vel (m/s) R Vel (m/s) L-R Vel (m/s)  Median Acr Palm Anti Sensory (2nd Digit)  32.3C  Wrist  *4.6   14.4  Wrist Palm     Palm             Radial Anti Sensory (Base 1st Digit)  31.5C  Wrist 2.5 2.5 0.0 13.4 8.3 38.1 Wrist Base 1st Digit     Ulnar Anti Sensory (5th Digit)  32.4C  Wrist  *6.9   *6.9  Wrist 5th Digit  20    Motor Left/Right Comparison   Stim Site L Lat (ms) R Lat (ms) L-R Lat (ms) L Amp (mV) R Amp (mV) L-R Amp (%) Site1 Site2 L Vel (m/s) R Vel (m/s) L-R Vel (m/s)  Median Motor (Abd Poll Brev)  32C  Wrist *4.4 *6.7 *2.3 *0.2 *2.3 *91.3 Elbow Wrist *30 *48 *18  Elbow 11.8 11.6 0.2 0.1 1.2 91.7       Ulnar Motor (Abd Dig Min)  32.1C  Wrist 3.4 3.2 0.2 8.4 8.9 5.6 B Elbow Wrist 55 54 1  B Elbow 7.3 7.1 0.2 6.4 8.2 22.0 A Elbow B Elbow 59 59 0  A Elbow 9.0 8.8 0.2 6.7 7.3 8.2          Waveforms:                      Clinical History: 11/23/2012 electrodiagnostic study of the right upper limb Impression: 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  Some underlying possible right C6 chronic mild radiculopathy. Performed by Dr. Margette Fast at [facility]   He reports that he has never smoked. He has never used smokeless tobacco. No results for input(s): HGBA1C, LABURIC in the last 8760 hours.  Objective:  VS:  HT:    WT:   BMI:     BP:   HR: bpm  TEMP: ( )  RESP:  Physical Exam Musculoskeletal:        General: No tenderness.     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 There is no swelling, color changes, allodynia or dystrophic changes. There is 5 out of 5 strength in the bilateral wrist extension, finger abduction and long finger flexion.  Decreased sensation in the left median nerve distribution.   There is a negative Hoffmann's test bilaterally.  Skin:    General: Skin is warm and dry.     Findings: No erythema or rash.  Neurological:     General: No focal deficit present.     Mental Status: He is alert and oriented to person, place, and time.     Sensory: No sensory deficit.     Motor: No weakness or abnormal muscle tone.     Coordination: Coordination normal.     Gait: Gait normal.  Psychiatric:  Mood and Affect: Mood normal.        Behavior: Behavior normal.        Thought Content: Thought content normal.     Ortho Exam  Imaging: No results found.  Past Medical/Family/Surgical/Social History: Medications & Allergies reviewed per EMR, new medications updated. Patient Active Problem List   Diagnosis Date Noted  . Paresthesia of skin 06/08/2020  . Right foot drop 06/08/2020  . Spinal stenosis of lumbar region with neurogenic claudication 01/15/2017  . Spondylolisthesis of lumbar region 01/15/2017  . Claw toe, acquired, left 01/06/2017  . Achilles tendon contracture, bilateral 01/06/2017  . Pain in metatarsus of both feet 01/06/2017  . Tubular adenoma of colon 12/29/2016  . Basal cell carcinoma 08/19/2016  . Umbilical hernia s/p lap repair with mesh  06/27/2016 06/27/2016  . Degenerative arthritis of knee 03/22/2015  . Dyspnea on exertion 01/31/2015  . Lumbar radiculopathy 12/12/2014  . Facet hypertrophy of lumbar region 12/12/2014  . Left knee pain 12/12/2014  . Ulcer of lower limb ([place]) 01/10/2014  . Varicose veins of lower extremities with other complications 83/66/2947  . 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 Renal insufficiency 12/21/2013  . Swelling in head/neck 07/28/2012  . Venous (peripheral) insufficiency 05/25/2012  . 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 Pain in limb 02/12/2012  . Hyperlipidemia 02/28/2008  . Allergic rhinitis 02/28/2008  . Sleep apnea 02/28/2008   Past Medical History:  Diagnosis Date  . Allergy    takes Zyrtec daily  . Anxiety    takes Ativan daily as needed  . Arthritis   . Asthma   . Back pain   . BPH (benign prostatic hyperplasia)    takes doxazosin for  . 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 Depression    takes Wellbutrin daily  . Dyspnea on exertion    with exertion  . GERD (gastroesophageal reflux disease)    takes Nexium and Omeprazole daily  . History of kidney stones   . Hx of [facility] spotted fever childhood  . Hyperlipidemia   . Neuromuscular disorder ([place])   . Prediabetes   . Sleep apnea   . Swelling of lower extremity    right more than leg leg  . Umbilical hernia    Family History  Problem Relation Age of Onset  . Thyroid disease Mother   . Heart disease Father   . Diabetes Maternal Grandfather   . Colon cancer Neg Hx   . Esophageal cancer Neg Hx    Past Surgical History:  Procedure Laterality Date  . ABDOMINAL AORTOGRAM W/LOWER EXTREMITY N/A 03/14/2020   Procedure: ABDOMINAL AORTOGRAM W/LOWER EXTREMITY;  Surgeon: Serafina Mitchell, MD;  Location: [facility];  Service: Vascular;  Laterality: N/A;  . 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 BACK SURGERY  2003   L4, L5  . BACK  SURGERY    . CATARACT EXTRACTION, BILATERAL    . ENDOVENOUS ABLATION SAPHENOUS VEIN W/ LASER Right 06-01-2014   EVLA right greater saphenous vein by Curt Jews MD   . epidural steroid injections        [facility] dr Ashani Pumphrey  . hernia repair  2003  . HERNIA REPAIR     hernia inguinal x 2  . TOTAL KNEE ARTHROPLASTY Left 03/22/2015   Procedure: TOTAL KNEE ARTHROPLASTY;  Surgeon: Meredith Pel, MD;  Location: [place];  Service: Orthopedics;  Laterality:  Left;  . UMBILICAL HERNIA REPAIR N/A 06/27/2016   Procedure: LAPAROSCOPIC ASSISTED REPAIR OF UMBILICAL HERINA WITH MESH;  Surgeon: Johnathan Hausen, MD;  Location: [facility];  Service: General;  Laterality: N/A;  . VASCULAR SURGERY  2015   right leg   Social History   Occupational History  . 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 Smoking status: Never Smoker  . Smokeless tobacco: Never Used  Vaping Use  . Vaping Use: Never used  Substance and Sexual Activity  . Alcohol use: No  . Drug use: No  . Sexual activity: Yes    Birth control/protection: Condom

## 2020-07-27 ENCOUNTER — Telehealth: Payer: Self-pay | Admitting: Gastroenterology

## 2020-07-27 NOTE — Telephone Encounter (Signed)
Pts ECL rescheduled to 08/16/20. Prep instructions reviewed with pt and he knows how to adjust the second dose of prep to start at 8:30am.

## 2020-07-31 ENCOUNTER — Encounter: Payer: Medicare Other | Admitting: Gastroenterology

## 2020-07-31 ENCOUNTER — Other Ambulatory Visit: Payer: Self-pay | Admitting: Gastroenterology

## 2020-08-06 ENCOUNTER — Telehealth: Payer: Self-pay | Admitting: Gastroenterology

## 2020-08-06 ENCOUNTER — Encounter: Payer: Self-pay | Admitting: Orthopedic Surgery

## 2020-08-06 ENCOUNTER — Ambulatory Visit (INDEPENDENT_AMBULATORY_CARE_PROVIDER_SITE_OTHER): Payer: Medicare Other | Admitting: Orthopedic Surgery

## 2020-08-06 VITALS — Ht 67.75 in | Wt 254.0 lb

## 2020-08-06 DIAGNOSIS — G5603 Carpal tunnel syndrome, bilateral upper limbs: Secondary | ICD-10-CM | POA: Diagnosis not present

## 2020-08-06 NOTE — Progress Notes (Signed)
Office Visit Note   Patient: [name]           Date of Birth: 1941/01/02           MRN: [id number] Visit Date: 08/06/2020              Requested by: Cari Caraway, [place],  [name] [id number] PCP: Cari Caraway, MD  Chief Complaint  Patient presents with  . Right Hand - Follow-up    EMG review  . Left Hand - Follow-up    EMG review      HPI: Patient is a 79 year old gentleman who presents in follow-up status post nerve conduction studies of both upper extremities.  Patient states he has most pain and symptoms in the left upper extremity compared to the right he states that yesterday he had some increased pain in the right hand.  Patient states that he occasionally has pain over the iliac crest on the right and massage has decreased the symptoms.  Patient states that he has been evaluated by Dr. Ellene Route and is considering L1-S1 fusion.   Assessment & Plan: Visit Diagnoses: No diagnosis found.  Plan: Discussed that with the fusion patient would have to be able to fully weight-bear through his hands to use a walker recommended proceeding with the carpal tunnel release on the left prior to considering the L1 S1 fusion.  Risks and benefits of surgery were discussed including infection neurovascular injury need for additional surgery.  Would plan for outpatient surgery at [facility] day surgery patient would like to set this up at his convenience.  Follow-Up Instructions: Return if symptoms worsen or fail to improve, for Follow-up 1 week after surgery.   Ortho Exam  Patient is alert, oriented, no adenopathy, well-dressed, normal affect, normal respiratory effort. Examination patient has a slow antalgic gait difficulty getting from a sitting.  Wound and positioning.  He has decreased grip strength bilaterally with a positive Phalen's and Tinel's test bilaterally.  Review of his nerve conduction studies shows severe bilateral left worse than right median nerve  entrapment at the wrist.  No evidence of other focal nerve neuropathy.  Imaging: No results found. No images are attached to the encounter.  Labs: Lab Results  Component Value Date   HGBA1C 5.8 03/11/2016   HGBA1C 5.5 11/10/2015   HGBA1C 5.7 (H) 08/27/2015   ESRSEDRATE 7 11/10/2015   LABURIC 7.0 01/15/2018   LABURIC 7.3 11/10/2015   REPTSTATUS 03/23/2015 FINAL 03/21/2015   GRAMSTAIN No WBC Seen 03/13/2014   GRAMSTAIN No Squamous Epithelial Cells Seen 03/13/2014   GRAMSTAIN Rare Gram Positive Cocci In Pairs 03/13/2014   CULT  03/21/2015    INSIGNIFICANT GROWTH Performed at [place] 04/05/2015     Lab Results  Component Value Date   ALBUMIN 4.0 08/05/2019   ALBUMIN 4.1 05/19/2016   ALBUMIN 4.2 03/11/2016   LABURIC 7.0 01/15/2018   LABURIC 7.3 11/10/2015    No results found for: MG No results found for: VD25OH  No results found for: PREALBUMIN CBC EXTENDED Latest Ref Rng & Units 03/14/2020 12/05/2019 08/05/2019  WBC 4.0 - 10.5 K/uL - 6.0 5.6  RBC 4.22 - 5.81 Mil/uL - 5.01 4.87  HGB 13.0 - 17.0 g/dL 16.0 15.4 15.2  HCT 39 - 52 % 47.0 46.5 44.6  PLT 150 - 400 K/uL - 164.0 156.0  NEUTROABS 1.4 - 7.7 K/uL - 4.3 3.7  LYMPHSABS 0.7 - 4.0 K/uL -  0.9 1.1     Body mass index is 38.91 kg/m.  Orders:  No orders of the defined types were placed in this encounter.  No orders of the defined types were placed in this encounter.    Procedures: No procedures performed  Clinical Data: No additional findings.  ROS:  All other systems negative, except as noted in the HPI. Review of Systems  Objective: Vital Signs: Ht 5' 7.75" (1.721 m)   Wt 254 lb (115.2 kg)   BMI 38.91 kg/m   Specialty Comments:  No specialty comments available.  PMFS History: Patient Active Problem List   Diagnosis Date Noted  . Paresthesia of skin 06/08/2020  . Right foot drop 06/08/2020  . Spinal stenosis of lumbar region with neurogenic claudication  01/15/2017  . Spondylolisthesis of lumbar region 01/15/2017  . Claw toe, acquired, left 01/06/2017  . Achilles tendon contracture, bilateral 01/06/2017  . Pain in metatarsus of both feet 01/06/2017  . Tubular adenoma of colon 12/29/2016  . Basal cell carcinoma 08/19/2016  . Umbilical hernia s/p lap repair with mesh 06/27/2016 06/27/2016  . Degenerative arthritis of knee 03/22/2015  . Dyspnea on exertion 01/31/2015  . Lumbar radiculopathy 12/12/2014  . Facet hypertrophy of lumbar region 12/12/2014  . Left knee pain 12/12/2014  . Ulcer of lower limb ([place]) 01/10/2014  . Varicose veins of lower extremities with other complications 58/07/9832  . 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 Renal insufficiency 12/21/2013  . Swelling in head/neck 07/28/2012  . Venous (peripheral) insufficiency 05/25/2012  . 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 Pain in limb 02/12/2012  . Hyperlipidemia 02/28/2008  . Allergic rhinitis 02/28/2008  . Sleep apnea 02/28/2008   Past Medical History:  Diagnosis Date  . Allergy    takes Zyrtec daily  . Anxiety    takes Ativan daily as needed  . Arthritis   . Asthma   . Back pain   . BPH (benign prostatic hyperplasia)    takes doxazosin for  . 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 Depression    takes Wellbutrin daily  . Dyspnea on exertion    with exertion  . GERD (gastroesophageal reflux disease)    takes Nexium and Omeprazole daily  . History of kidney stones   . 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 Hyperlipidemia   . Neuromuscular disorder ([place])   . Prediabetes   . Sleep apnea   . Swelling of lower extremity    right more than leg leg  . Umbilical hernia     Family History  Problem Relation Age of Onset  . Thyroid disease Mother   . Heart disease Father   . Diabetes Maternal Grandfather   . Colon cancer Neg Hx   . Esophageal cancer Neg Hx     Past Surgical History:  Procedure  Laterality Date  . ABDOMINAL AORTOGRAM W/LOWER EXTREMITY N/A 03/14/2020   Procedure: ABDOMINAL AORTOGRAM W/LOWER EXTREMITY;  Surgeon: Serafina Mitchell, MD;  Location: [place] [facility];  Service: Vascular;  Laterality: N/A;  . 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 BACK SURGERY  2003   L4, L5  . BACK SURGERY    . CATARACT EXTRACTION, BILATERAL    . ENDOVENOUS ABLATION SAPHENOUS VEIN W/ LASER Right 06-01-2014   EVLA right greater saphenous vein by Curt Jews MD   . epidural steroid injections  [facility] dr [place]  . hernia repair  2003  . HERNIA REPAIR     hernia inguinal x 2  . TOTAL KNEE ARTHROPLASTY Left 03/22/2015   Procedure: TOTAL KNEE ARTHROPLASTY;  Surgeon: Meredith Pel, MD;  Location: [place];  Service: Orthopedics;  Laterality: Left;  . UMBILICAL HERNIA REPAIR N/A 06/27/2016   Procedure: LAPAROSCOPIC ASSISTED REPAIR OF UMBILICAL HERINA WITH MESH;  Surgeon: Johnathan Hausen, MD;  Location: [facility];  Service: General;  Laterality: N/A;  . VASCULAR SURGERY  2015   right leg   Social History   Occupational History  . 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 Smoking status: Never Smoker  . Smokeless tobacco: Never Used  Vaping Use  . Vaping Use: Never used  Substance and Sexual Activity  . Alcohol use: No  . Drug use: No  . Sexual activity: Yes    Birth control/protection: Condom

## 2020-08-06 NOTE — Telephone Encounter (Signed)
Pt states his suprep copay was 100.00, reports he cannot afford this and is requesting something cheaper.   Pt provided with a Plenvu sample prep, updated instructions provided to the pt.

## 2020-08-06 NOTE — Telephone Encounter (Signed)
Pt is requesting a call back from a nurse regarding his upcoming procedure. Pt did not wish to specify.

## 2020-08-09 ENCOUNTER — Encounter (INDEPENDENT_AMBULATORY_CARE_PROVIDER_SITE_OTHER): Payer: Self-pay | Admitting: Family Medicine

## 2020-08-09 ENCOUNTER — Ambulatory Visit (INDEPENDENT_AMBULATORY_CARE_PROVIDER_SITE_OTHER): Payer: Medicare Other | Admitting: Family Medicine

## 2020-08-09 ENCOUNTER — Other Ambulatory Visit: Payer: Self-pay

## 2020-08-09 VITALS — BP 154/88 | HR 62 | Temp 98.2°F | Ht 68.0 in | Wt 255.0 lb

## 2020-08-09 DIAGNOSIS — R0602 Shortness of breath: Secondary | ICD-10-CM | POA: Diagnosis not present

## 2020-08-09 DIAGNOSIS — K219 Gastro-esophageal reflux disease without esophagitis: Secondary | ICD-10-CM

## 2020-08-09 DIAGNOSIS — G8929 Other chronic pain: Secondary | ICD-10-CM

## 2020-08-09 DIAGNOSIS — Z1331 Encounter for screening for depression: Secondary | ICD-10-CM | POA: Diagnosis not present

## 2020-08-09 DIAGNOSIS — Z9989 Dependence on other enabling machines and devices: Secondary | ICD-10-CM | POA: Diagnosis not present

## 2020-08-09 DIAGNOSIS — E65 Localized adiposity: Secondary | ICD-10-CM | POA: Diagnosis not present

## 2020-08-09 DIAGNOSIS — D508 Other iron deficiency anemias: Secondary | ICD-10-CM | POA: Diagnosis not present

## 2020-08-09 DIAGNOSIS — E7849 Other hyperlipidemia: Secondary | ICD-10-CM

## 2020-08-09 DIAGNOSIS — G4733 Obstructive sleep apnea (adult) (pediatric): Secondary | ICD-10-CM | POA: Diagnosis not present

## 2020-08-09 DIAGNOSIS — M545 Low back pain, unspecified: Secondary | ICD-10-CM

## 2020-08-09 DIAGNOSIS — F3289 Other specified depressive episodes: Secondary | ICD-10-CM

## 2020-08-09 DIAGNOSIS — R5383 Other fatigue: Secondary | ICD-10-CM | POA: Diagnosis not present

## 2020-08-09 DIAGNOSIS — Z6838 Body mass index (BMI) 38.0-38.9, adult: Secondary | ICD-10-CM | POA: Diagnosis not present

## 2020-08-13 NOTE — Progress Notes (Signed)
Dear Dr. Gwenlyn Found,   Thank you for referring [name] to our clinic. The following note includes my evaluation and treatment recommendations.  Chief Complaint:   [name] (MR# [id number]) is a 79 y.o. male who presents for evaluation and treatment of obesity and related comorbidities. Current BMI is Body mass index is 38.77 kg/m. [name] has been struggling with his weight for many years and has been unsuccessful in either losing weight, maintaining weight loss, or reaching his healthy weight goal.  [name] is currently in the action stage of change and ready to dedicate time achieving and maintaining a healthier weight. [name] is interested in becoming our patient and working on intensive lifestyle modifications including (but not limited to) diet and exercise for weight loss.  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  He is single.  He is scheduled for a colonoscopy next week.  He had labs a few weeks ago at Dr. Baldomero Lamy office.  [name] provided the following food recall today:  Breakfast:  Broccoli quiche Kristopher Hanson) or bran raisin muffin or liver pudding and cheese. Lunch:  [place] chicken covered in dressing (takes off skin), collard greens. Dinner:  Best boy, bean salad ([employer]) in vinegar.   He takes an apple vinegar shot in the morning.  He likes fruit pies, sometimes ice cream (no sugar added).  Drinks:  Instant coffee in the morning, water.  Rarely diet soda.  [name]'s habits were reviewed today and are as follows: his desired weight loss is 70 pounds, he started gaining weight after hurting his back, his heaviest weight ever was 260 pounds, he snacks frequently in the evenings, he is frequently drinking liquids with calories and he struggles with emotional eating.  Depression Screen [name]'s Food and Mood (modified PHQ-9) score was 10.  Depression screen PHQ 2/9 08/09/2020  Decreased Interest 3    Down, Depressed, Hopeless 2  PHQ - 2 Score 5  Altered sleeping 0  Tired, decreased energy 3  Change in appetite 1  Feeling bad or failure about yourself  1  Trouble concentrating 0  Moving slowly or fidgety/restless 0  Suicidal thoughts 0  PHQ-9 Score 10  Difficult doing work/chores Somewhat difficult  Some recent data might be hidden   Assessment/Plan:   1. Other fatigue Chadwin admits to daytime somnolence and denies waking up still tired. Patent has a history of symptoms of daytime fatigue and snoring. Keary generally gets 8 or 9 hours of sleep per night, and states that he has generally restful sleep. Snoring is present. Apneic episodes are not present. Epworth Sleepiness Score is 3.  [name] uses a CPAP machine.  [name] does feel that his weight is causing his energy to be lower than it should be. Fatigue may be related to obesity, depression or many other causes. Labs will be ordered, and in the meanwhile, Faron will focus on self care including making healthy food choices, increasing physical activity and focusing on stress reduction.  2. SOB (shortness of breath) on exertion Aedon notes increasing shortness of breath with exercising and seems to be worsening over time with weight gain. He notes getting out of breath sooner with activity than he used to. This has gotten worse recently. [name] denies shortness of breath at rest or orthopnea.  [name] does feel that he gets out of breath more easily that he used to when he exercises. [name]'s shortness of breath appears to be obesity related and exercise  induced. He has agreed to work on weight loss and gradually increase exercise to treat his exercise induced shortness of breath. Will continue to monitor closely.  3. Other hyperlipidemia Not optimized. [name] is taking Crestor. We will request updated labs from [name], pateint's PCP.  Lab Results  Component Value Date   ALT 29 08/05/2019   AST 34 08/05/2019    ALKPHOS 82 08/05/2019   BILITOT 0.7 08/05/2019   Lab Results  Component Value Date   CHOL 175 03/11/2016   HDL 38 (L) 03/11/2016   LDLCALC 97 03/11/2016   TRIG 199 (H) 03/11/2016   CHOLHDL 4.6 03/11/2016   4. Gastroesophageal reflux disease We discussed several lifestyle modifications today and he will continue to work on diet, exercise and weight loss efforts.   Counseling . If a person has gastroesophageal reflux disease (GERD), food and stomach acid move back up into the esophagus and cause symptoms or problems such as damage to the esophagus. . Anti-reflux measures include: raising the head of the bed, avoiding tight clothing or belts, avoiding eating late at night, not lying down shortly after mealtime, and achieving weight loss. . Avoid ASA, NSAID's, caffeine, alcohol, and tobacco.  . OTC Pepcid and/or Tums are often very helpful for as needed use.  [name] However, for persisting chronic or daily symptoms, stronger medications like Omeprazole may be needed. . You may need to avoid foods and drinks such as: ? Coffee and tea (with or without caffeine). ? Drinks that contain alcohol. ? Energy drinks and sports drinks. ? Bubbly (carbonated) drinks or sodas. ? Chocolate and cocoa. ? Peppermint and mint flavorings. ? Garlic and onions. ? Horseradish. ? Spicy and acidic foods. These include peppers, chili powder, curry powder, vinegar, hot sauces, and BBQ sauce. ? Citrus fruit juices and citrus fruits, such as oranges, lemons, and limes. ? Tomato-based foods. These include red sauce, chili, salsa, and pizza with red sauce. ? Fried and fatty foods. These include donuts, french fries, potato chips, and high-fat dressings. ? High-fat meats. These include hot dogs, rib eye steak, sausage, ham, and bacon.  5. Other iron deficiency anemia Nutrition: Iron-rich foods include dark leafy greens, red and white meats, eggs, seafood, and beans.  Certain foods and drinks prevent your body from  absorbing iron properly. Avoid eating these foods in the same meal as iron-rich foods or with iron supplements. These foods include: coffee, black tea, and red wine; milk, dairy products, and foods that are high in calcium; beans and soybeans; whole grains. Constipation can be a side effect of iron supplementation. Increased water and fiber intake are helpful. Water goal: > 2 liters/day. Fiber goal: > 25 grams/day.  6. Chronic low back pain, unspecified back pain laterality, unspecified whether sciatica present Maria sees Dr. Ellene Route for his back pain.  He says he is "on the verge" of an L1-S1 fusion.  7. Visceral obesity Visceral adipose tissue is a hormonally active component of total body fat. This body composition phenotype is associated with medical disorders such as metabolic syndrome, cardiovascular disease and several malignancies including prostate, breast, and colorectal cancers. Goal: Lose 7-10% of starting weight. Visceral fat rating should be < 13.  8. OSA on CPAP Brandon has a diagnosis of sleep apnea. He reports that he is using a CPAP regularly.   9. Other depression, with emotional eating PHQ-9 is 10.  Garret is struggling with emotional eating and using food for comfort to the extent that it is negatively impacting his health.  He has been working on behavior modification techniques to help reduce his emotional eating and has been unsuccessful. He shows no sign of suicidal or homicidal ideations.  10. Class 2 severe obesity with serious comorbidity and body mass index (BMI) of 38.0 to 38.9 in adult, unspecified obesity type ([place])  Faraaz is currently in the action stage of change and his goal is to continue with weight loss efforts. I recommend Carmello begin the structured treatment plan as follows:  He has agreed to keeping a food journal and adhering to recommended goals of 1200-1500 calories and 95+ grams of protein.  Exercise goals: No exercise has been prescribed at this  time.   Behavioral modification strategies: increasing lean protein intake, decreasing simple carbohydrates, increasing vegetables and increasing water intake.  He was informed of the importance of frequent follow-up visits to maximize his success with intensive lifestyle modifications for his multiple health conditions. He was informed we would discuss his lab results at his next visit unless there is a critical issue that needs to be addressed sooner. Antoino agreed to keep his next visit at the agreed upon time to discuss these results.  Objective:   Blood pressure (!) 154/88, pulse 62, temperature 98.2 F (36.8 C), temperature source Temporal, height 5\' 8"  (1.727 m), weight 255 lb (115.7 kg), SpO2 95 %. Body mass index is 38.77 kg/m.  Indirect Calorimeter completed today shows a VO2 of 278 and a REE of 1933.  His calculated basal metabolic rate is 9798 thus his basal metabolic rate is worse than expected.  General: Cooperative, alert, well developed, in no acute distress. HEENT: Conjunctivae and lids unremarkable. Cardiovascular: Regular rhythm.  Lungs: Normal work of breathing. Neurologic: No focal deficits.   Lab Results  Component Value Date   CREATININE 1.60 (H) 03/14/2020   BUN 17 03/14/2020   NA 139 03/14/2020   K 4.0 03/14/2020   CL 100 03/14/2020   CO2 27 08/05/2019   Lab Results  Component Value Date   ALT 29 08/05/2019   AST 34 08/05/2019   ALKPHOS 82 08/05/2019   BILITOT 0.7 08/05/2019   Lab Results  Component Value Date   HGBA1C 5.8 03/11/2016   HGBA1C 5.5 11/10/2015   HGBA1C 5.7 (H) 08/27/2015   HGBA1C 5.6 08/03/2013   Lab Results  Component Value Date   TSH 2.359 11/10/2015   Lab Results  Component Value Date   CHOL 175 03/11/2016   HDL 38 (L) 03/11/2016   LDLCALC 97 03/11/2016   TRIG 199 (H) 03/11/2016   CHOLHDL 4.6 03/11/2016   Lab Results  Component Value Date   WBC 6.0 12/05/2019   HGB 16.0 03/14/2020   HCT 47.0 03/14/2020   MCV 92.8  12/05/2019   PLT 164.0 12/05/2019   Lab Results  Component Value Date   IRON 95 12/05/2019   TIBC 340 12/05/2019   FERRITIN 15.4 (L) 12/05/2019   Obesity Behavioral Intervention:   Approximately 15 minutes were spent on the discussion below.  ASK: We discussed the diagnosis of obesity with Vashaun today and Cy agreed to give [place] permission to discuss obesity behavioral modification therapy today.  ASSESS: Millan has the diagnosis of obesity and his BMI today is 38.8. Maynor is in the action stage of change.   ADVISE: Zaidan was educated on the multiple health risks of obesity as well as the benefit of weight loss to improve his health. He was advised of the need for long term treatment and the importance of lifestyle  modifications to improve his current health and to decrease his risk of future health problems.  AGREE: Multiple dietary modification options and treatment options were discussed and Raistlin agreed to follow the recommendations documented in the above note.  ARRANGE: Jaelon was educated on the importance of frequent visits to treat obesity as outlined per CMS and USPSTF guidelines and agreed to schedule his next follow up appointment today.  Attestation Statements:   Reviewed by clinician on day of visit: allergies, medications, problem list, medical history, surgical history, family history, social history, and previous encounter notes.  I, Water quality scientist, CMA, am acting as transcriptionist for Briscoe Deutscher, DO  I have reviewed the above documentation for accuracy and completeness, and I agree with the above. Briscoe Deutscher, DO

## 2020-08-16 ENCOUNTER — Encounter: Payer: Medicare Other | Admitting: Gastroenterology

## 2020-08-22 ENCOUNTER — Ambulatory Visit (INDEPENDENT_AMBULATORY_CARE_PROVIDER_SITE_OTHER): Payer: Medicare Other | Admitting: Family Medicine

## 2020-08-22 ENCOUNTER — Encounter (INDEPENDENT_AMBULATORY_CARE_PROVIDER_SITE_OTHER): Payer: Self-pay

## 2020-08-24 DIAGNOSIS — Z23 Encounter for immunization: Secondary | ICD-10-CM | POA: Diagnosis not present

## 2020-08-30 ENCOUNTER — Other Ambulatory Visit: Payer: Self-pay | Admitting: Urology

## 2020-08-30 NOTE — Telephone Encounter (Signed)
This is a [facility] patient.  Not sure why this got sent via [facility] but I did it in [facility] since the patient called there for the refill.

## 2020-09-05 ENCOUNTER — Other Ambulatory Visit: Payer: Self-pay

## 2020-09-05 ENCOUNTER — Ambulatory Visit (AMBULATORY_SURGERY_CENTER): Payer: Medicare Other | Admitting: Gastroenterology

## 2020-09-05 ENCOUNTER — Encounter: Payer: Self-pay | Admitting: Gastroenterology

## 2020-09-05 VITALS — BP 111/62 | HR 59 | Temp 97.7°F | Resp 16 | Ht 67.0 in | Wt 254.0 lb

## 2020-09-05 DIAGNOSIS — K2270 Barrett's esophagus without dysplasia: Secondary | ICD-10-CM

## 2020-09-05 DIAGNOSIS — D125 Benign neoplasm of sigmoid colon: Secondary | ICD-10-CM

## 2020-09-05 DIAGNOSIS — K219 Gastro-esophageal reflux disease without esophagitis: Secondary | ICD-10-CM | POA: Diagnosis not present

## 2020-09-05 DIAGNOSIS — D123 Benign neoplasm of transverse colon: Secondary | ICD-10-CM

## 2020-09-05 DIAGNOSIS — K5730 Diverticulosis of large intestine without perforation or abscess without bleeding: Secondary | ICD-10-CM

## 2020-09-05 DIAGNOSIS — D126 Benign neoplasm of colon, unspecified: Secondary | ICD-10-CM

## 2020-09-05 DIAGNOSIS — R194 Change in bowel habit: Secondary | ICD-10-CM

## 2020-09-05 DIAGNOSIS — D122 Benign neoplasm of ascending colon: Secondary | ICD-10-CM | POA: Diagnosis not present

## 2020-09-05 DIAGNOSIS — R195 Other fecal abnormalities: Secondary | ICD-10-CM | POA: Diagnosis not present

## 2020-09-05 DIAGNOSIS — K317 Polyp of stomach and duodenum: Secondary | ICD-10-CM

## 2020-09-05 DIAGNOSIS — D124 Benign neoplasm of descending colon: Secondary | ICD-10-CM

## 2020-09-05 DIAGNOSIS — D121 Benign neoplasm of appendix: Secondary | ICD-10-CM

## 2020-09-05 MED ORDER — SODIUM CHLORIDE 0.9 % IV SOLN: 500.0000 mL | INTRAVENOUS | Status: DC

## 2020-09-05 NOTE — Op Note (Signed)
[facility] Patient Name: [name] Procedure Date: 09/05/2020 2:25 PM MRN: [id number] Endoscopist: Thornton Park MD, MD Age: 79 Referring MD:  Date of Birth: 07-08-1941 Gender: Male Account #: [id number] Procedure:                Upper GI endoscopy Indications:              Dyspepsia                           RLQ abdominal pain with altered bowel habits -                            worse over the last month                           Blood in the stool without associated anemia, labs                            consistent with iron deficiency                           Dyspepsia presenting with eructation, early                            satiety, flatus, post-prandial bloating, and                            epigastric pain                           GERD with ongoing symptoms despite daily Nexium and                            Carafate Medicines:                Monitored Anesthesia Care Procedure:                Pre-Anesthesia Assessment:                           - Prior to the procedure, a History and Physical                            was performed, and patient medications and                            allergies were reviewed. The patient's tolerance of                            previous anesthesia was also reviewed. The risks                            and benefits of the procedure and the sedation                            options and risks were discussed with the patient.  All questions were answered, and informed consent                            was obtained. Prior Anticoagulants: The patient has                            taken no previous anticoagulant or antiplatelet                            agents. ASA Grade Assessment: III - A patient with                            severe systemic disease. After reviewing the risks                            and benefits, the patient was deemed in                            satisfactory  condition to undergo the procedure.                           After obtaining informed consent, the endoscope was                            passed under direct vision. Throughout the                            procedure, the patient's blood pressure, pulse, and                            oxygen saturations were monitored continuously. The                            Endoscope was introduced through the mouth, and                            advanced to the third part of duodenum. The upper                            GI endoscopy was accomplished without difficulty.                            The patient tolerated the procedure well. Scope In: Scope Out: Findings:                 Two tongues of salmon-colored mucosa were present                            at 37 cm. No other visible abnormalities were                            present. The maximum longitudinal extent of these  esophageal mucosal changes was 1 cm in length.                            Biopsies were taken with a cold forceps for                            histology. Estimated blood loss was minimal.                           Diffuse minimal inflammation was found in the                            gastric body. Biopsies were taken from the antrum,                            body, and fundus with a cold forceps for histology.                            Estimated blood loss was minimal.                           Multiple small sessile polyps with no bleeding and                            no stigmata of recent bleeding were found in the                            gastric fundus and in the gastric body. Biopsies                            were taken with a cold forceps for histology.                            Estimated blood loss was minimal.                           The examined duodenum was normal. Biopsies were                            taken with a cold forceps for histology. Estimated                             blood loss was minimal.                           The cardia and gastric fundus were normal on                            retroflexion.                           The exam was otherwise without abnormality. Complications:            No immediate complications. Estimated blood loss:  Minimal. Estimated Blood Loss:     Estimated blood loss was minimal. Impression:               - Salmon-colored mucosa suspicious for                            short-segment Barrett's esophagus. Biopsied.                           - Gastritis. Biopsied.                           - Multiple gastric polyps. Biopsied.                           - Normal examined duodenum. Biopsied.                           - The examination was otherwise normal. Recommendation:           - Patient has a contact number available for                            emergencies. The signs and symptoms of potential                            delayed complications were discussed with the                            patient. Return to normal activities tomorrow.                            Written discharge instructions were provided to the                            patient.                           - Resume previous diet.                           - Continue present medications.                           - No aspirin, ibuprofen, naproxen, or other                            non-steroidal anti-inflammatory drugs.                           - Await pathology results.                           - Proceed with colonoscopy as previously planned. Thornton Park MD, MD 09/05/2020 3:26:24 PM This report has been signed electronically.

## 2020-09-05 NOTE — Progress Notes (Signed)
Called to room to assist during endoscopic procedure.  Patient ID and intended procedure confirmed with present staff. Received instructions for my participation in the procedure from the performing physician.  

## 2020-09-05 NOTE — Progress Notes (Signed)
To PACU, VSS. Report to [email or web address] 

## 2020-09-05 NOTE — Op Note (Signed)
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                            Benson [place] 2018                           -Dr. Benson [place] recommended surveillance in 5 years                           No known family history of colon cancer or polyps Medicines:                Monitored Anesthesia Care Procedure:                Pre-Anesthesia Assessment:                           - Prior to the procedure, a History and Physical                            was performed, and patient medications and                            allergies were reviewed. The patient's tolerance of                            previous anesthesia was also reviewed. The risks                            and benefits of the procedure and the sedation                            options and risks were discussed with the patient.  All questions were answered, and informed consent                            was obtained. Prior Anticoagulants: The patient has                            taken no previous anticoagulant or antiplatelet                            agents. ASA Grade Assessment: III - A patient with                            severe systemic disease. After reviewing the risks                            and benefits, the patient was deemed in                            satisfactory condition to undergo the  procedure.                           After obtaining informed consent, the colonoscope                            was passed under direct vision. Throughout the                            procedure, the patient's blood pressure, pulse, and                            oxygen saturations were monitored continuously. The                            Colonoscope was introduced through the anus and                            advanced to the the cecum, identified by                            appendiceal orifice and ileocecal valve. The                            colonoscopy was performed with moderate difficulty                            due to significant looping. Successful completion                            of the procedure was aided by applying abdominal                            pressure. The patient tolerated the procedure well.  The quality of the bowel preparation was good                            although there were areas with residual stool/prep.                            Over 1L of liquid was removed from the colon during                            the procedure. The ileocecal valve, appendiceal                            orifice, and rectum were photographed. Scope In: 2:57:36 PM Scope Out: 3:20:35 PM Scope Withdrawal Time: 0 hours 19 minutes 9 seconds  Total Procedure Duration: 0 hours 22 minutes 59 seconds  Findings:                 The perianal and digital rectal examinations were                            normal.                           Multiple small and large-mouthed diverticula were                            found in the sigmoid colon and descending colon.                           Six sessile polyps were found in the sigmoid colon                            (1), splenic flexure (1), hepatic flexure (2),                            ascending colon (1) and appendiceal orifice (1).                            The polyps were 1 to 2 mm in size.  These polyps                            were removed with a cold snare. Resection and                            retrieval were complete. Estimated blood loss was                            minimal.                           The examined colon was otherwise normal. Biopsies                            were taken with a cold forceps for histology.  Biopsies for histology were taken with a cold                            forceps from the right colon and left colon for                            evaluation of microscopic colitis. Estimated blood                            loss was minimal.                           The exam was otherwise without abnormality on                            direct and retroflexion views except for internal                            hemorrhoids. Complications:            No immediate complications. Estimated blood loss:                            Minimal. Estimated Blood Loss:     Estimated blood loss was minimal. Impression:               - Diverticulosis in the sigmoid colon and in the                            descending colon.                           - Six 1 to 2 mm polyps in the sigmoid colon, at the                            splenic flexure, at the hepatic flexure, in the                            ascending colon and at the appendiceal orifice,                            removed with a cold snare. Resected and retrieved.                           - The entire examined colon is normal. Biopsied.                           - The examination was otherwise normal on direct                            and retroflexion views. Recommendation:           - Patient has a contact number available for  emergencies. The signs and symptoms of potential                            delayed complications were discussed with the                            patient. Return to normal activities tomorrow.                             Written discharge instructions were provided to the                            patient.                           - Follow a high fiber diet. Drink at least 64                            ounces of water daily. Add a daily stool bulking                            agent such as psyllium (an exampled would be                            Metamucil).                           - Continue present medications.                           - Await pathology results.                           - Repeat colonoscopy date to be determined after                            pending pathology results are reviewed for                            surveillance.                           - Emerging evidence supports eating a diet of                            fruits, vegetables, grains, calcium, and yogurt                            while reducing red meat and alcohol may reduce the                            risk of colon cancer. Thornton Park MD, MD 09/05/2020 3:32:03 PM This report has been signed electronically.

## 2020-09-05 NOTE — Patient Instructions (Signed)
Handout given;  Diverticulosis, Polyps, High fiber Continue current medications await pathology results Resume previous diet No aspirin , ibuprofen, naproxen, Aleve, or any other NSAIDS Follow a high fiber diet   YOU HAD AN ENDOSCOPIC PROCEDURE TODAY AT [place]:   Refer to the procedure report that was given to you for any specific questions about what was found during the examination.  If the procedure report does not answer your questions, please call your gastroenterologist to clarify.  If you requested that your care partner not be given the details of your procedure findings, then the procedure report has been included in a sealed envelope for you to review at your convenience later.  YOU SHOULD EXPECT: Some feelings of bloating in the abdomen. Passage of more gas than usual.  Walking can help get rid of the air that was put into your GI tract during the procedure and reduce the bloating. If you had a lower endoscopy (such as a colonoscopy or flexible sigmoidoscopy) you may notice spotting of blood in your stool or on the toilet paper. If you underwent a bowel prep for your procedure, you may not have a normal bowel movement for a few days.  Please Note:  You might notice some irritation and congestion in your nose or some drainage.  This is from the oxygen used during your procedure.  There is no need for concern and it should clear up in a day or so.  SYMPTOMS TO REPORT IMMEDIATELY:   Following lower endoscopy (colonoscopy or flexible sigmoidoscopy):  Excessive amounts of blood in the stool  Significant tenderness or worsening of abdominal pains  Swelling of the abdomen that is new, acute  Fever of 100F or higher   Following upper endoscopy (EGD)  Vomiting of blood or coffee ground material  New chest pain or pain under the shoulder blades  Painful or persistently difficult swallowing  New shortness of breath  Fever of 100F or higher  Black, tarry-looking  stools  For urgent or emergent issues, a gastroenterologist can be reached at any hour by calling [phone]. Do not use MyChart messaging for urgent concerns.    DIET:  We do recommend a small meal at first, but then you may proceed to your regular diet.  Drink plenty of fluids but you should avoid alcoholic beverages for 24 hours.  ACTIVITY:  You should plan to take it easy for the rest of today and you should NOT DRIVE or use heavy machinery until tomorrow (because of the sedation medicines used during the test).    FOLLOW UP: Our staff will call the number listed on your records 48-72 hours following your procedure to check on you and address any questions or concerns that you may have regarding the information given to you following your procedure. If we do not reach you, we will leave a message.  We will attempt to reach you two times.  During this call, we will ask if you have developed any symptoms of COVID 19. If you develop any symptoms (ie: fever, flu-like symptoms, shortness of breath, cough etc.) before then, please call [phone].  If you test positive for Covid 19 in the 2 weeks post procedure, please call and report this information to [place].    If any biopsies were taken you will be contacted by phone or by letter within the next 1-3 weeks.  Please call us at [phone] if you have not heard about the biopsies in 3 weeks.  SIGNATURES/CONFIDENTIALITY: You and/or your care partner have signed paperwork which will be entered into your electronic medical record.  These signatures attest to the fact that that the information above on your After Visit Summary has been reviewed and is understood.  Full responsibility of the confidentiality of this discharge information lies with you and/or your care-partner.

## 2020-09-06 ENCOUNTER — Telehealth: Payer: Self-pay | Admitting: Physical Medicine and Rehabilitation

## 2020-09-06 DIAGNOSIS — M48062 Spinal stenosis, lumbar region with neurogenic claudication: Secondary | ICD-10-CM

## 2020-09-06 DIAGNOSIS — M5416 Radiculopathy, lumbar region: Secondary | ICD-10-CM

## 2020-09-06 NOTE — Telephone Encounter (Signed)
Patient called requesting a call back from Dr. Ernestina Patches. Patient did not disclose the nature of his call. Patient phone number is [phone].

## 2020-09-07 ENCOUNTER — Telehealth: Payer: Self-pay | Admitting: *Deleted

## 2020-09-07 NOTE — Telephone Encounter (Signed)
Follow up call made. 

## 2020-09-07 NOTE — Telephone Encounter (Signed)
  Follow up Call-  Call back number 09/05/2020  Post procedure Call Back phone  # [phone]  Permission to leave phone message Yes  Some recent data might be hidden     Patient questions:  Do you have a fever, pain , or abdominal swelling? No. Pain Score  0 *  Have you tolerated food without any problems? Yes.    Have you been able to return to your normal activities? Yes.    Do you have any questions about your discharge instructions: Diet   No. Medications  No. Follow up visit  No.  Do you have questions or concerns about your Care? No.  Actions: * If pain score is 4 or above: No action needed, pain <4.  1. Have you developed a fever since your procedure? no  2.   Have you had an respiratory symptoms (SOB or cough) since your procedure? no  3.   Have you tested positive for COVID 19 since your procedure no  4.   Have you had any family members/close contacts diagnosed with the COVID 19 since your procedure?  no   If yes to any of these questions please route to Joylene John, RN and Joella Prince, RN

## 2020-09-10 NOTE — Telephone Encounter (Signed)
Left message #1

## 2020-09-11 ENCOUNTER — Ambulatory Visit (HOSPITAL_BASED_OUTPATIENT_CLINIC_OR_DEPARTMENT_OTHER): Admission: RE | Admit: 2020-09-11 | Payer: Medicare Other | Source: Home / Self Care | Admitting: Orthopedic Surgery

## 2020-09-11 ENCOUNTER — Encounter (HOSPITAL_BASED_OUTPATIENT_CLINIC_OR_DEPARTMENT_OTHER): Admission: RE | Payer: Self-pay | Source: Home / Self Care

## 2020-09-11 SURGERY — CARPAL TUNNEL RELEASE
Anesthesia: Choice | Laterality: Left

## 2020-09-12 ENCOUNTER — Telehealth: Payer: Self-pay | Admitting: Physical Medicine and Rehabilitation

## 2020-09-12 NOTE — Telephone Encounter (Signed)
Patient would like you to make a referral to the surgeon at [facility] that he says you mentioned to him. Please advise.

## 2020-09-12 NOTE — Telephone Encounter (Signed)
Patient returned call asked for a call back as soon as possible.  The number to contact patient is [phone]

## 2020-09-12 NOTE — Telephone Encounter (Signed)
[place]. Not specific surgeon. Dr. Robyn Haber is lead

## 2020-09-12 NOTE — Telephone Encounter (Signed)
See previous message

## 2020-09-12 NOTE — Telephone Encounter (Signed)
Referral placed.

## 2020-09-14 ENCOUNTER — Encounter: Payer: Self-pay | Admitting: Gastroenterology

## 2020-09-14 NOTE — Progress Notes (Signed)
Opened in error

## 2020-09-26 ENCOUNTER — Telehealth: Payer: Self-pay | Admitting: Physical Medicine and Rehabilitation

## 2020-09-26 NOTE — Telephone Encounter (Signed)
Patient called asked for a call back concerning referral to [facility] for his back. The number to contact patient is [phone]

## 2020-09-27 NOTE — Telephone Encounter (Signed)
Called patient to advise that referral was faxed to [facility] on 11/4. Gave him the phone number to call.

## 2020-10-17 ENCOUNTER — Telehealth: Payer: Self-pay | Admitting: Physical Medicine and Rehabilitation

## 2020-10-17 NOTE — Telephone Encounter (Signed)
Patient states he need a return call back from [place]. Please call patient as soon as possible at [phone].

## 2020-10-17 NOTE — Telephone Encounter (Signed)
Called patient and will reach out to [facility] tomorrow.

## 2020-10-18 DIAGNOSIS — R600 Localized edema: Secondary | ICD-10-CM | POA: Diagnosis not present

## 2020-10-18 DIAGNOSIS — S90911A Unspecified superficial injury of right ankle, initial encounter: Secondary | ICD-10-CM | POA: Diagnosis not present

## 2020-10-18 NOTE — Telephone Encounter (Signed)
Faxed additional reports to Dr. Vivi Barrack- [facility].

## 2020-10-25 DIAGNOSIS — G959 Disease of spinal cord, unspecified: Secondary | ICD-10-CM | POA: Diagnosis not present

## 2020-10-25 DIAGNOSIS — M21371 Foot drop, right foot: Secondary | ICD-10-CM | POA: Diagnosis not present

## 2020-10-25 DIAGNOSIS — M5441 Lumbago with sciatica, right side: Secondary | ICD-10-CM | POA: Diagnosis not present

## 2020-10-25 DIAGNOSIS — R29898 Other symptoms and signs involving the musculoskeletal system: Secondary | ICD-10-CM | POA: Diagnosis not present

## 2020-10-25 DIAGNOSIS — R2689 Other abnormalities of gait and mobility: Secondary | ICD-10-CM | POA: Diagnosis not present

## 2020-10-25 DIAGNOSIS — M4802 Spinal stenosis, cervical region: Secondary | ICD-10-CM | POA: Diagnosis not present

## 2020-10-25 DIAGNOSIS — M4156 Other secondary scoliosis, lumbar region: Secondary | ICD-10-CM | POA: Diagnosis not present

## 2020-10-25 DIAGNOSIS — M545 Low back pain, unspecified: Secondary | ICD-10-CM | POA: Diagnosis not present

## 2020-10-25 DIAGNOSIS — G8929 Other chronic pain: Secondary | ICD-10-CM | POA: Diagnosis not present

## 2020-11-01 ENCOUNTER — Other Ambulatory Visit (HOSPITAL_BASED_OUTPATIENT_CLINIC_OR_DEPARTMENT_OTHER): Payer: Self-pay | Admitting: Orthopaedic Surgery

## 2020-11-01 DIAGNOSIS — M4802 Spinal stenosis, cervical region: Secondary | ICD-10-CM

## 2020-11-14 ENCOUNTER — Other Ambulatory Visit: Payer: Self-pay

## 2020-11-14 ENCOUNTER — Ambulatory Visit
Admission: RE | Admit: 2020-11-14 | Discharge: 2020-11-14 | Disposition: A | Discharge status: Home / Self Care | Payer: Medicare Other | Source: Ambulatory Visit | Attending: Orthopaedic Surgery | Admitting: Orthopaedic Surgery

## 2020-11-14 DIAGNOSIS — M4802 Spinal stenosis, cervical region: Secondary | ICD-10-CM | POA: Diagnosis not present

## 2020-12-17 ENCOUNTER — Other Ambulatory Visit (INDEPENDENT_AMBULATORY_CARE_PROVIDER_SITE_OTHER): Payer: Self-pay | Admitting: Family Medicine

## 2020-12-21 DIAGNOSIS — J301 Allergic rhinitis due to pollen: Secondary | ICD-10-CM | POA: Diagnosis not present

## 2020-12-21 DIAGNOSIS — N183 Chronic kidney disease, stage 3 unspecified: Secondary | ICD-10-CM | POA: Diagnosis not present

## 2020-12-21 DIAGNOSIS — F411 Generalized anxiety disorder: Secondary | ICD-10-CM | POA: Diagnosis not present

## 2020-12-21 DIAGNOSIS — L989 Disorder of the skin and subcutaneous tissue, unspecified: Secondary | ICD-10-CM | POA: Diagnosis not present

## 2020-12-21 DIAGNOSIS — G479 Sleep disorder, unspecified: Secondary | ICD-10-CM | POA: Diagnosis not present

## 2020-12-21 DIAGNOSIS — K219 Gastro-esophageal reflux disease without esophagitis: Secondary | ICD-10-CM | POA: Diagnosis not present

## 2020-12-21 DIAGNOSIS — E782 Mixed hyperlipidemia: Secondary | ICD-10-CM | POA: Diagnosis not present

## 2020-12-21 DIAGNOSIS — G4733 Obstructive sleep apnea (adult) (pediatric): Secondary | ICD-10-CM | POA: Diagnosis not present

## 2020-12-21 DIAGNOSIS — J4520 Mild intermittent asthma, uncomplicated: Secondary | ICD-10-CM | POA: Diagnosis not present

## 2020-12-21 DIAGNOSIS — M48062 Spinal stenosis, lumbar region with neurogenic claudication: Secondary | ICD-10-CM | POA: Diagnosis not present

## 2020-12-21 DIAGNOSIS — N401 Enlarged prostate with lower urinary tract symptoms: Secondary | ICD-10-CM | POA: Diagnosis not present

## 2020-12-21 DIAGNOSIS — E291 Testicular hypofunction: Secondary | ICD-10-CM | POA: Diagnosis not present

## 2020-12-29 NOTE — Progress Notes (Deleted)
Cardiology Office Note:    Date:  12/29/2020   ID:  [name], DOB 1941-10-29, MRN [id number]  PCP:  Cari Caraway, MD  Cardiologist:  Quay Burow, MD  Electrophysiologist:  None   Referring MD: Cari Caraway, MD   Chief Complaint: follow-up of chronic dyspnea   History of Present Illness:    [name] is a 80 y.o. male with a remote history of chest pain with normal coronary arteries on cardiac catheterization in 2008, chronic dyspnea, chronic lower extremity venous insufficiency s/p endovenous ablation of right saphenous vein in 05/2014, hypertension, hyperlipidemia, pre-diabetes, sleep apnea on CPAP, GERD, and anxiety/depression who is followed by Dr. Gwenlyn Found and presents today for routine follow-up.   Patient has a history of chest pain and underwent cardiac catheterization 2008 which showed normal coronaries. He underwent repeat Myoview in 02/2017 due to increasing dyspnea on exertion and this showed no evidence of ischemia. Last Echo is 07/2019 showed LVEF of 60-65% with grade 1 diastolic dysfunction and mild AI.   Patient has chronic lower extremity edema due to venous insuffiency and underwent endovenous ablation of right saphenous vein by Dr. Sherren Mocha in 2015. He was seen by Dr. Carlis Abbott in 02/2020 for further evaluation of cold right lower extremity with toe ulcer. Vascular ultrasound with ABIs were ordered and showed normal ABIs and TBIs bilaterally. He ultimately underwent peripheral angiogram in 03/2020 which showed no significant aortoiliac disease or significant outflow disease with three-vessel runoff bilaterally.   Patient was last seen by Dr. Gwenlyn Found in 06/2020 at which time he continued to be limited by dyspnea as well as joint pain but denied chest pain. He also reported very somatic complaints of feeling diaphoretic and having a slow heart rate in the morning but these were not felt to be related. Dyspnea felt to be multifactorial due to obesity, mild diasotlic dysfunction,  and reactive airways. Zio monitor was ordered for further evaluation of possible bradycardia. Unfortunately it does not look like this has been completed.  Patient presents today for follow-up. ***  Chronic Dyspnea - *** - Myoview in 02/2017 showed no ischemia.  - Last Echo in 07/2019 showed 60-65% with only grade 1 diastolic dysfunction. - Felt to be multifactorial due to obesity, mild diastolic dysfunction, and reactive airway disease. ***  Chronic Venous Insufficiency - *** - Continue Lasix 40mg  daily.  Obstructive Sleep Apnea - Compliant with CPAP. ***  Hypertension  Hyperlipidemia - Lipid panel from ***: *** - Continue Crestor 5mg  daily.  Past Medical History:  Diagnosis Date  . Allergy    takes Zyrtec daily  . Anxiety    takes Ativan daily as needed  . Arthritis   . Asthma   . Back pain   . BPH (benign prostatic hyperplasia)    takes doxazosin for  . Carpal tunnel syndrome   . 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 Depression    takes Wellbutrin daily  . Dyspnea on exertion    with exertion  . GERD (gastroesophageal reflux disease)    takes Nexium and Omeprazole daily  . History of kidney stones   . HTN (hypertension)   . Hx of [facility] spotted fever childhood  . Hyperlipidemia   . Joint pain   . Neuromuscular disorder ([place])   . Prediabetes   . Sleep apnea   . SOB (shortness of breath)   . Swallowing difficulty   . Swelling of lower extremity    right more than  leg leg  . Umbilical hernia     Past Surgical History:  Procedure Laterality Date  . ABDOMINAL AORTOGRAM W/LOWER EXTREMITY N/A 03/14/2020   Procedure: ABDOMINAL AORTOGRAM W/LOWER EXTREMITY;  Surgeon: Serafina Mitchell, MD;  Location: [facility];  Service: Vascular;  Laterality: N/A;  . 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 BACK SURGERY  2003   L4, L5  . BACK SURGERY    . CATARACT EXTRACTION, BILATERAL    . ENDOVENOUS ABLATION SAPHENOUS VEIN W/  LASER Right 06-01-2014   EVLA right greater saphenous vein by Curt Jews MD   . epidural steroid injections        [facility] dr [facility]  . hernia repair  2003  . HERNIA REPAIR     hernia inguinal x 2  . 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 UMBILICAL HERNIA REPAIR N/A 06/27/2016   Procedure: LAPAROSCOPIC ASSISTED REPAIR OF UMBILICAL HERINA WITH MESH;  Surgeon: Johnathan Hausen, MD;  Location: [facility];  Service: General;  Laterality: N/A;  . VASCULAR SURGERY  2015   right leg    Current Medications: No outpatient medications have been marked as taking for the 12/31/20 encounter (Appointment) with Darreld Mclean, PA-C.     Allergies:   Patient has no known allergies.   Social History   Socioeconomic History  . Marital status: Single    Spouse name: Not on file  . Number of children: Not on file  . Years of education: Not on file  . Highest education level: Not on file  Occupational History  . 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 Smoking status: Never Smoker  . Smokeless tobacco: Never Used  Vaping Use  . Vaping Use: Never used  Substance and Sexual Activity  . Alcohol use: No  . Drug use: No  . Sexual activity: Yes    Birth control/protection: Condom  Other Topics Concern  . Not on file  Social History Narrative   ** Merged History Encounter **       Single. Education: Other.    Social Determinants of Health   Financial Resource Strain: Not on file  Food Insecurity: Not on file  Transportation Needs: Not on file  Physical Activity: Not on file  Stress: Not on file  Social Connections: Not on file     Family History: The patient's family history includes Diabetes in his maternal grandfather; Heart disease in his father; Hypertension in his father; Thyroid disease in his mother. There is no  history of Colon cancer or Esophageal cancer.  ROS:   Please see the history of present illness.     EKGs/Labs/Other Studies Reviewed:    The following studies were reviewed today:  Myoview 02/19/2017:  Nuclear stress EF: 45%. Mild diffuse hypokinesis  The left ventricular ejection fraction is mildly decreased (45-54%). Note, echocardiogram demonstrates normal ejection fraction.  There was no ST segment deviation noted during stress.  This is a low risk study. No ischemia identified. _______________  Echocardiogram 08/10/2019: Impressions: 1. Left ventricular ejection fraction, by visual estimation, is 60 to  65%. The left ventricle has normal function. There is no left ventricular  hypertrophy.  2. Left ventricular diastolic Doppler parameters are consistent with  impaired relaxation pattern of LV diastolic filling.  3. Global right  ventricle has normal systolic function.The right  ventricular size is normal. No increase in right ventricular wall  thickness.  4. Left atrial size was normal.  5. Right atrial size was normal.  6. The mitral valve is grossly normal. Trace mitral valve regurgitation.  7. The tricuspid valve is grossly normal. Tricuspid valve regurgitation  is trivial.  8. The aortic valve is tricuspid Aortic valve regurgitation is mild by  color flow Doppler. Mild aortic valve sclerosis without stenosis.  9. The pulmonic valve was grossly normal. Pulmonic valve regurgitation is  mild by color flow Doppler.  10. The inferior vena cava is dilated in size with <50% respiratory  variability, suggesting right atrial pressure of 15 mmHg.  _______________  Peripheral Angiogram 03/14/2020: Findings: - Aortogram: No significant renal artery stenosis.  The infrarenal abdominal aorta is widely patent.  Bilateral common and external iliac arteries are widely patent. - Right Lower Extremity: The right common femoral profundofemoral and superficial femoral artery  are widely patent.  Popliteal artery is widely patent.  There is three-vessel runoff.  There is an intact pedal arch. - Left Lower Extremity: Left common femoral profundofemoral and superficial femoral artery widely patent.  The popliteal artery is widely patent.  There is three-vessel runoff.   EKG:  EKG not ordered today.   Recent Labs: 03/14/2020: BUN 17; Creatinine, Ser 1.60; Hemoglobin 16.0; Potassium 4.0; Sodium 139  Recent Lipid Panel    Component Value Date/Time   CHOL 175 03/11/2016 1340   TRIG 199 (H) 03/11/2016 1340   HDL 38 (L) 03/11/2016 1340   CHOLHDL 4.6 03/11/2016 1340   VLDL 40 (H) 03/11/2016 1340   LDLCALC 97 03/11/2016 1340    Physical Exam:    Vital Signs: There were no vitals taken for this visit.    Wt Readings from Last 3 Encounters:  09/05/20 254 lb (115.2 kg)  08/09/20 255 lb (115.7 kg)  08/06/20 254 lb (115.2 kg)     General: 80 y.o. male in no acute distress. HEENT: Normocephalic and atraumatic. Sclera clear. EOMs intact. Neck: Supple. No carotid bruits. No JVD. Heart: *** RRR. Distinct S1 and S2. No murmurs, gallops, or rubs. Radial and distal pedal pulses 2+ and equal bilaterally. Lungs: No increased work of breathing. Clear to ausculation bilaterally. No wheezes, rhonchi, or rales.  Abdomen: Soft, non-distended, and non-tender to palpation. Bowel sounds present in all 4 quadrants.  MSK: Normal strength and tone for age. *** Extremities: No lower extremity edema.    Skin: Warm and dry. Neuro: Alert and oriented x3. No focal deficits. Psych: Normal affect. Responds appropriately.   Assessment:    No diagnosis found.  Plan:     Disposition: Follow up in ***   Medication Adjustments/Labs and Tests Ordered: Current medicines are reviewed at length with the patient today.  Concerns regarding medicines are outlined above.  No orders of the defined types were placed in this encounter.  No orders of the defined types were placed in this  encounter.   There are no Patient Instructions on file for this visit.   Signed, Darreld Mclean, PA-C  12/29/2020 12:42 PM    [facility]

## 2020-12-31 ENCOUNTER — Ambulatory Visit: Payer: Medicare Other | Admitting: Student

## 2020-12-31 DIAGNOSIS — N183 Chronic kidney disease, stage 3 unspecified: Secondary | ICD-10-CM | POA: Diagnosis not present

## 2020-12-31 DIAGNOSIS — R7303 Prediabetes: Secondary | ICD-10-CM | POA: Diagnosis not present

## 2020-12-31 DIAGNOSIS — K219 Gastro-esophageal reflux disease without esophagitis: Secondary | ICD-10-CM | POA: Diagnosis not present

## 2020-12-31 DIAGNOSIS — G4733 Obstructive sleep apnea (adult) (pediatric): Secondary | ICD-10-CM | POA: Diagnosis not present

## 2020-12-31 DIAGNOSIS — N1832 Chronic kidney disease, stage 3b: Secondary | ICD-10-CM | POA: Diagnosis not present

## 2020-12-31 DIAGNOSIS — L03031 Cellulitis of right toe: Secondary | ICD-10-CM | POA: Diagnosis not present

## 2020-12-31 DIAGNOSIS — G479 Sleep disorder, unspecified: Secondary | ICD-10-CM | POA: Diagnosis not present

## 2020-12-31 DIAGNOSIS — I872 Venous insufficiency (chronic) (peripheral): Secondary | ICD-10-CM | POA: Diagnosis not present

## 2020-12-31 DIAGNOSIS — E782 Mixed hyperlipidemia: Secondary | ICD-10-CM | POA: Diagnosis not present

## 2020-12-31 DIAGNOSIS — J301 Allergic rhinitis due to pollen: Secondary | ICD-10-CM | POA: Diagnosis not present

## 2020-12-31 DIAGNOSIS — L97511 Non-pressure chronic ulcer of other part of right foot limited to breakdown of skin: Secondary | ICD-10-CM | POA: Diagnosis not present

## 2020-12-31 DIAGNOSIS — Z79899 Other long term (current) drug therapy: Secondary | ICD-10-CM | POA: Diagnosis not present

## 2020-12-31 DIAGNOSIS — L97311 Non-pressure chronic ulcer of right ankle limited to breakdown of skin: Secondary | ICD-10-CM | POA: Diagnosis not present

## 2020-12-31 DIAGNOSIS — G8929 Other chronic pain: Secondary | ICD-10-CM | POA: Diagnosis not present

## 2020-12-31 DIAGNOSIS — J4520 Mild intermittent asthma, uncomplicated: Secondary | ICD-10-CM | POA: Diagnosis not present

## 2021-01-02 DIAGNOSIS — L97311 Non-pressure chronic ulcer of right ankle limited to breakdown of skin: Secondary | ICD-10-CM | POA: Diagnosis not present

## 2021-01-02 DIAGNOSIS — L03031 Cellulitis of right toe: Secondary | ICD-10-CM | POA: Diagnosis not present

## 2021-01-02 DIAGNOSIS — Z5189 Encounter for other specified aftercare: Secondary | ICD-10-CM | POA: Diagnosis not present

## 2021-01-02 DIAGNOSIS — L97512 Non-pressure chronic ulcer of other part of right foot with fat layer exposed: Secondary | ICD-10-CM | POA: Diagnosis not present

## 2021-01-14 ENCOUNTER — Telehealth: Payer: Self-pay | Admitting: Orthopedic Surgery

## 2021-01-14 ENCOUNTER — Telehealth: Payer: Self-pay | Admitting: Physical Medicine and Rehabilitation

## 2021-01-14 NOTE — Telephone Encounter (Signed)
Do you have a surgical sheet on this pt?

## 2021-01-14 NOTE — Telephone Encounter (Signed)
Ok for pain not weaknes and ask what [name] thinks?

## 2021-01-14 NOTE — Telephone Encounter (Signed)
Pt called stating he would like to go ahead and set up his carpal tunnel surgery   [phone]

## 2021-01-14 NOTE — Telephone Encounter (Signed)
Bilateral L4 TF 06/12/20. Please advise.

## 2021-01-14 NOTE — Telephone Encounter (Signed)
Pt called stating he would like to get an injection   [phone]

## 2021-01-15 NOTE — Telephone Encounter (Signed)
Patient reports his pain is more on the left this time. Scheduled for 3/23 at 1400 with driver.

## 2021-01-17 DIAGNOSIS — L97511 Non-pressure chronic ulcer of other part of right foot limited to breakdown of skin: Secondary | ICD-10-CM | POA: Diagnosis not present

## 2021-01-17 DIAGNOSIS — M79672 Pain in left foot: Secondary | ICD-10-CM | POA: Diagnosis not present

## 2021-01-18 ENCOUNTER — Telehealth: Payer: Self-pay | Admitting: Physical Medicine and Rehabilitation

## 2021-01-18 NOTE — Telephone Encounter (Signed)
Pt called stating he has been trying to get in touch with courtney and would like a CB  [phone]

## 2021-01-18 NOTE — Telephone Encounter (Signed)
Patient wanted to see if we had any cancellations. I advised that we would call him if anyone cancels before his appointment on 3/23.

## 2021-01-24 ENCOUNTER — Ambulatory Visit: Payer: Medicare Other | Admitting: Podiatry

## 2021-01-30 ENCOUNTER — Encounter: Payer: Self-pay | Admitting: Physical Medicine and Rehabilitation

## 2021-01-30 ENCOUNTER — Other Ambulatory Visit: Payer: Self-pay

## 2021-01-30 ENCOUNTER — Ambulatory Visit (INDEPENDENT_AMBULATORY_CARE_PROVIDER_SITE_OTHER): Payer: Medicare Other | Admitting: Physical Medicine and Rehabilitation

## 2021-01-30 ENCOUNTER — Ambulatory Visit: Payer: Self-pay

## 2021-01-30 VITALS — BP 160/84 | HR 78

## 2021-01-30 DIAGNOSIS — M48062 Spinal stenosis, lumbar region with neurogenic claudication: Secondary | ICD-10-CM

## 2021-01-30 DIAGNOSIS — M5416 Radiculopathy, lumbar region: Secondary | ICD-10-CM

## 2021-01-30 DIAGNOSIS — G5602 Carpal tunnel syndrome, left upper limb: Secondary | ICD-10-CM

## 2021-01-30 DIAGNOSIS — M21371 Foot drop, right foot: Secondary | ICD-10-CM

## 2021-01-30 DIAGNOSIS — R202 Paresthesia of skin: Secondary | ICD-10-CM

## 2021-01-30 MED ORDER — BETAMETHASONE SOD PHOS & ACET 6 (3-3) MG/ML IJ SUSP
12.0000 mg | Freq: Once | INTRAMUSCULAR | Status: AC
  Administered 2021-01-30: 12 mg

## 2021-01-30 NOTE — Patient Instructions (Signed)

## 2021-01-30 NOTE — Progress Notes (Signed)
Hydrocodone 5mg  PCP, Pt state lower back pain on both sides mostly his left. Pt state walking, standing and sitting makes the pain worse. Pt state he takes pain meds to help ease the pain. Pt has hx of inj on 06/12/20 pt state it helped.  Numeric Pain Rating Scale and Functional Assessment Average Pain 2   In the last MONTH (on 0-10 scale) has pain interfered with the following?  1. General activity like being  able to carry out your everyday physical activities such as walking, climbing stairs, carrying groceries, or moving a chair?  Rating(7)   +Driver, -BT, -Dye Allergies.

## 2021-01-30 NOTE — Progress Notes (Signed)
[name] - 80 y.o. male MRN [id number]  Date of birth: 1941-09-01  Office Visit Note: Visit Date: 01/30/2021 PCP: Cari Caraway, MD Referred by: Cari Caraway, MD  Subjective: Chief Complaint  Patient presents with  . Lower Back - Pain   HPI: [name] is a 80 y.o. male who comes in today For continued evaluation and management of worsening low back pain with bilateral radicular type leg pain and right sided foot drop.  Please see our prior notes for further details and justification as we have seen this patient on many occasions.  He has had surgical consultation with Dr. Kristeen Miss at [facility] and Barbee Cough, MD at the [facility].  The notes from Dr. Fletcher Anon were reviewed.  The patient has not followed up with him recently and there is an outstanding MRI of the cervical spine.  The patient has had physical therapy they have had medication management they are on a small amount of hydrocodone from her primary care physician Dr. Leonides Schanz.  Patient complains of worsening severe low back pain worse with standing and walking better at rest.  Better with the hydrocodone.  No new focal weakness or bowel or bladder changes.  He does have increased urination Sharol Given being on chronic diuretic.  He has had the right foot drop for some time this is not worsened.  He is unable to wear an AFO because he has a hard time getting it on and off.  He still continues to have paresthesias particularly on the right.  There has been talk about surgical decompression and fusion and he has had 2 opinions now and continues to follow-up with Dr. Fletcher Anon.  He rates his average pain is a 2 out of 10 but he says when he is trying to stand and walk it is very limiting to what he can do at that point and the pain is greatly increased at that point.  He also endorses neck pain with paresthesias in the hands.  He is scheduled to have left-sided carpal tunnel release  performed by Dr. Meridee Score.  Electrodiagnostic studies had been performed.  At the time he did not show any great deal of cervical radiculopathy.  His clinical symptoms in the hand include a positive flick sign with symptoms in a classic median nerve distribution on the left.  He reports constant symptoms now on the left.  Some difficulty manipulating fine objects etc.  Review of Systems  Musculoskeletal: Positive for back pain, joint pain and neck pain.  Neurological: Positive for tingling.  All other systems reviewed and are negative.  Otherwise per HPI.  Assessment & Plan: Visit Diagnoses:    ICD-10-CM   1. Lumbar radiculopathy  M54.16 XR C-ARM NO REPORT    Epidural Steroid injection    betamethasone acetate-betamethasone sodium phosphate (CELESTONE) injection 12 mg  2. Spinal stenosis of lumbar region with neurogenic claudication  M48.062 XR C-ARM NO REPORT    Epidural Steroid injection    betamethasone acetate-betamethasone sodium phosphate (CELESTONE) injection 12 mg  3. Right foot drop  M21.371 XR C-ARM NO REPORT    Epidural Steroid injection    betamethasone acetate-betamethasone sodium phosphate (CELESTONE) injection 12 mg  4. Paresthesia of skin  R20.2   5. Carpal tunnel syndrome, left upper limb  G56.02      Plan: Findings:  1.  Chronic worsening severe axial low back pain with referral pain in the hips and buttocks and a  pretty classic neurogenic claudication type symptom.  He does have a positive grocery cart sign it is better with forward flexion while walking.  He has difficulty standing and ambulating.  He is better at rest.  He has had injections in the past which were beneficial.  These tend to last anywhere from 2 to 6 months.  The foot drop is not gotten any worse he still has weakness with dorsiflexion on the right.  He does trips.  AFO had been ordered for him but he cannot wear it do to difficulty getting on and off Duda body habitus and pain.  He has had follow-ups  and consultations with 2 neurosurgeons.  He continues to follow with Dr. Fletcher Anon at [facility].  They will review MRI etc. of the cervical spine.  We will complete today epidural injection diagnostically and hopefully therapeutically as this is helped him in the past and he is in quite a bit of pain.  He will continue with pain medication through primary care physician.  2.  Neck pain with tingling numbness particular in the left hand.  His left hand tingling in the seems to be pretty classic carpal tunnel syndrome this was confirmed electrodiagnostic study.  I did encourage him to continue with plans for having the surgical decompression of his left carpal tunnel as I think that will help him.  We discussed at length nerves and nerve compression and recovery etc.  He is asked a lot of questions about recovery from surgery which I encouraged him to talk Dr. Sharol Given.    Meds & Orders:  Meds ordered this encounter  Medications  . betamethasone acetate-betamethasone sodium phosphate (CELESTONE) injection 12 mg    Orders Placed This Encounter  Procedures  . XR C-ARM NO REPORT  . Epidural Steroid injection    Follow-up: Return if symptoms worsen or fail to improve.   Procedures: No procedures performed  Lumbosacral Transforaminal Epidural Steroid Injection - Sub-Pedicular Approach with Fluoroscopic Guidance  Patient: [name]      Date of Birth: [birth year] MRN: [id number] PCP: Cari Caraway, MD      Visit Date: 01/30/2021   Universal Protocol:    Date/Time: 01/30/2021  Consent Given By: the patient  Position: PRONE  Additional Comments: Vital signs were monitored before and after the procedure. Patient was prepped and draped in the usual sterile fashion. The correct patient, procedure, and site was verified.   Injection Procedure Details:   Procedure diagnoses: Lumbar radiculopathy [M54.16]    Meds Administered:  Meds ordered this encounter  Medications  . betamethasone  acetate-betamethasone sodium phosphate (CELESTONE) injection 12 mg    Laterality: Bilateral  Location/Site:  L4-L5  Needle:6.0 in., 22 ga.  Short bevel or Quincke spinal needle  Needle Placement: Transforaminal  Findings:    -Comments: Excellent flow of contrast along the nerve, nerve root and into the epidural space.  Procedure Details: After squaring off the end-plates to get a true AP view, the C-arm was positioned so that an oblique view of the foramen as noted above was visualized. The target area is just inferior to the "nose of the scotty dog" or sub pedicular. The soft tissues overlying this structure were infiltrated with 2-3 ml. of 1% Lidocaine without Epinephrine.  The spinal needle was inserted toward the target using a "trajectory" view along the fluoroscope beam.  Under AP and lateral visualization, the needle was advanced so it did not puncture dura and was located close the 6 O'Clock position of the  pedical in AP tracterory. Biplanar projections were used to confirm position. Aspiration was confirmed to be negative for CSF and/or blood. A 1-2 ml. volume of Isovue-250 was injected and flow of contrast was noted at each level. Radiographs were obtained for documentation purposes.   After attaining the desired flow of contrast documented above, a 0.5 to 1.0 ml test dose of 0.25% Marcaine was injected into each respective transforaminal space.  The patient was observed for 90 seconds post injection.  After no sensory deficits were reported, and normal lower extremity motor function was noted,   the above injectate was administered so that equal amounts of the injectate were placed at each foramen (level) into the transforaminal epidural space.   Additional Comments:  The patient tolerated the procedure well Dressing: 2 x 2 sterile gauze and Band-Aid    Post-procedure details: Patient was observed during the procedure. Post-procedure instructions were reviewed.  Patient  left the clinic in stable condition.      Clinical History: CT LUMBAR MYELOGRAM FINDINGS:  T10-11: No stenosis. Facet degeneration and ligamentous calcification. Solid bridging anterior osteophytes.  T11-12: No stenosis.  T12-L1: Mild bulging of the disc. No compressive stenosis. Conus tip at this level.  L1-2: 2 mm retrolisthesis. Moderate multifactorial spinal stenosis. Disc space narrowing. Endplate osteophytes. Facet and ligamentous hypertrophy. Foraminal stenosis on both sides that could compress the L1 nerves.  L2-3: 2 mm retrolisthesis. Chronic disc degeneration with loss of disc height and vacuum phenomenon. Endplate osteophytes. Facet and ligamentous hypertrophy. Severe multifactorial spinal stenosis likely to cause neural compression. Bilateral foraminal stenosis that could compress the L2 nerves.  L3-4: 2 mm retrolisthesis. Endplate osteophytes and bulging of the disc. Facet and ligamentous hypertrophy. Severe multifactorial spinal stenosis likely to cause neural compression. Bilateral foraminal stenosis that could affect the exiting L3 nerves.  L4-5: Chronic facet arthropathy with anterolisthesis of 1 cm. Disc degeneration with disc space narrowing and vacuum phenomenon. Endplate osteophytes and bulging of the disc. Severe stenosis of the central canal and both neural foramina likely to cause neural compression.  L5-S1: Chronic disc degeneration with loss of disc height and vacuum phenomenon. Endplate osteophytes. Facet hypertrophy and degeneration with some ligamentous calcification. Severe stenosis of the subarticular lateral recesses and neural foramina.  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 Curvature convex to the left in the thoracolumbar junction region and towards the right in the lower lumbar region. 2 mm retrolisthesis L1-2, L2-3 and L3-4. 1 cm anterolisthesis L4-5.  Severe  multifactorial spinal stenosis and neural foraminal stenosis likely to cause neural compression at L2-3, L3-4, L4-5 and L5-S1. Moderate multifactorial canal stenosis at L1-2. Bilateral foraminal stenosis at that level.   Electronically Signed   By: Nelson Chimes M.D.   On: 12/06/2019 10:12   He reports that he has never smoked. He has never used smokeless tobacco. No results for input(s): HGBA1C, LABURIC in the last 8760 hours.  Objective:  VS:  HT:    WT:   BMI:     BP:(!) 160/84  HR:78bpm  TEMP: ( )  RESP:  Physical Exam Vitals and nursing note reviewed.  Constitutional:      General: He is not in acute distress.    Appearance: Normal appearance. He is obese. He is not ill-appearing.  HENT:     Head: Normocephalic and atraumatic.     Right Ear: External ear normal.     Left Ear: External ear normal.     Nose: No congestion.  Eyes:     Extraocular Movements: Extraocular movements intact.  Cardiovascular:     Rate and Rhythm: Normal rate.     Pulses: Normal pulses.  Pulmonary:     Effort: Pulmonary effort is normal. No respiratory distress.  Abdominal:     General: There is no distension.     Palpations: Abdomen is soft.  Musculoskeletal:        General: No tenderness or signs of injury.     Cervical back: Neck supple.     Right lower leg: No edema.     Left lower leg: No edema.     Comments: Patient has difficulty going from sit to stand has pain with facet loading and extension.  He has decreased symptoms with forward flexion.  Is negative slump test bilaterally has good strength on the left he has 4 out of 5 dorsiflexion on the right.  Examination of his neck shows decreased range of motion of the cervical spine he has decreased sensation median nerve distribution on the left.  Skin:    Findings: No erythema or rash.  Neurological:     General: No focal deficit present.     Mental Status: He is alert and oriented to person, place, and time.     Sensory: No  sensory deficit.     Motor: No weakness or abnormal muscle tone.     Coordination: Coordination normal.  Psychiatric:        Mood and Affect: Mood normal.        Behavior: Behavior normal.     Ortho Exam  Imaging: XR C-ARM NO REPORT  Result Date: 01/30/2021 Please see Notes tab for imaging impression.   Past Medical/Family/Surgical/Social History: Medications & Allergies reviewed per EMR, new medications updated. Patient Active Problem List   Diagnosis Date Noted  . Paresthesia of skin 06/08/2020  . Right foot drop 06/08/2020  . Spinal stenosis of lumbar region with neurogenic claudication 01/15/2017  . Spondylolisthesis of lumbar region 01/15/2017  . Claw toe, acquired, left 01/06/2017  . Achilles tendon contracture, bilateral 01/06/2017  . Pain in metatarsus of both feet 01/06/2017  . Tubular adenoma of colon 12/29/2016  . Basal cell carcinoma 08/19/2016  . Umbilical hernia s/p lap repair with mesh 06/27/2016 06/27/2016  . Degenerative arthritis of knee 03/22/2015  . Dyspnea on exertion 01/31/2015  . Lumbar radiculopathy 12/12/2014  . Facet hypertrophy of lumbar region 12/12/2014  . Left knee pain 12/12/2014  . Ulcer of lower limb ([place]) 01/10/2014  . Varicose veins of lower extremities with other complications 49/70/2637  . 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 Renal insufficiency 12/21/2013  . Swelling in head/neck 07/28/2012  . Venous (peripheral) insufficiency 05/25/2012  . 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 Pain in limb 02/12/2012  . Hyperlipidemia 02/28/2008  . Allergic rhinitis 02/28/2008  . Sleep apnea 02/28/2008   Past Medical History:  Diagnosis Date  . Allergy    takes Zyrtec daily  . Anxiety    takes Ativan daily as needed  . Arthritis   . Asthma   . Back pain   . BPH (benign prostatic hyperplasia)    takes doxazosin for  . Carpal tunnel syndrome   . 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  Depression    takes Wellbutrin daily  . Dyspnea on exertion    with exertion  . GERD (gastroesophageal reflux disease)    takes Nexium and Omeprazole daily  . History  of kidney stones   . HTN (hypertension)   . 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 Hyperlipidemia   . Joint pain   . Neuromuscular disorder ([place])   . Prediabetes   . Sleep apnea   . SOB (shortness of breath)   . Swallowing difficulty   . Swelling of lower extremity    right more than leg leg  . Umbilical hernia    Family History  Problem Relation Age of Onset  . Thyroid disease Mother   . Heart disease Father   . Hypertension Father   . Diabetes Maternal Grandfather   . Colon cancer Neg Hx   . Esophageal cancer Neg Hx    Past Surgical History:  Procedure Laterality Date  . ABDOMINAL AORTOGRAM W/LOWER EXTREMITY N/A 03/14/2020   Procedure: ABDOMINAL AORTOGRAM W/LOWER EXTREMITY;  Surgeon: Serafina Mitchell, MD;  Location: [facility];  Service: Vascular;  Laterality: N/A;  . 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 BACK SURGERY  2003   L4, L5  . BACK SURGERY    . CATARACT EXTRACTION, BILATERAL    . ENDOVENOUS ABLATION SAPHENOUS VEIN W/ LASER Right 06-01-2014   EVLA right greater saphenous vein by Curt Jews MD   . epidural steroid injections        [facility] dr Kei Mcelhiney  . hernia repair  2003  . HERNIA REPAIR     hernia inguinal x 2  . TOTAL KNEE ARTHROPLASTY Left 03/22/2015   Procedure: TOTAL KNEE ARTHROPLASTY;  Surgeon: Meredith Pel, MD;  Location: [facility];  Service: Orthopedics;  Laterality: Left;  . UMBILICAL HERNIA REPAIR N/A 06/27/2016   Procedure: LAPAROSCOPIC ASSISTED REPAIR OF UMBILICAL HERINA WITH MESH;  Surgeon: Johnathan Hausen, MD;  Location: [facility];  Service: General;  Laterality: N/A;  . VASCULAR SURGERY  2015   right leg   Social History   Occupational History  . 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 Smoking status:  Never Smoker  . Smokeless tobacco: Never Used  Vaping Use  . Vaping Use: Never used  Substance and Sexual Activity  . Alcohol use: No  . Drug use: No  . Sexual activity: Yes    Birth control/protection: Condom

## 2021-01-30 NOTE — Procedures (Signed)
Lumbosacral Transforaminal Epidural Steroid Injection - Sub-Pedicular Approach with Fluoroscopic Guidance  Patient: [name]      Date of Birth: 04-Apr-1941 MRN: [id number] PCP: Cari Caraway, MD      Visit Date: 01/30/2021   Universal Protocol:    Date/Time: 01/30/2021  Consent Given By: the patient  Position: PRONE  Additional Comments: Vital signs were monitored before and after the procedure. Patient was prepped and draped in the usual sterile fashion. The correct patient, procedure, and site was verified.   Injection Procedure Details:   Procedure diagnoses: Lumbar radiculopathy [M54.16]    Meds Administered:  Meds ordered this encounter  Medications  . betamethasone acetate-betamethasone sodium phosphate (CELESTONE) injection 12 mg    Laterality: Bilateral  Location/Site:  L4-L5  Needle:6.0 in., 22 ga.  Short bevel or Quincke spinal needle  Needle Placement: Transforaminal  Findings:    -Comments: Excellent flow of contrast along the nerve, nerve root and into the epidural space.  Procedure Details: After squaring off the end-plates to get a true AP view, the C-arm was positioned so that an oblique view of the foramen as noted above was visualized. The target area is just inferior to the "nose of the scotty dog" or sub pedicular. The soft tissues overlying this structure were infiltrated with 2-3 ml. of 1% Lidocaine without Epinephrine.  The spinal needle was inserted toward the target using a "trajectory" view along the fluoroscope beam.  Under AP and lateral visualization, the needle was advanced so it did not puncture dura and was located close the 6 O'Clock position of the pedical in AP tracterory. Biplanar projections were used to confirm position. Aspiration was confirmed to be negative for CSF and/or blood. A 1-2 ml. volume of Isovue-250 was injected and flow of contrast was noted at each level. Radiographs were obtained for documentation purposes.    After attaining the desired flow of contrast documented above, a 0.5 to 1.0 ml test dose of 0.25% Marcaine was injected into each respective transforaminal space.  The patient was observed for 90 seconds post injection.  After no sensory deficits were reported, and normal lower extremity motor function was noted,   the above injectate was administered so that equal amounts of the injectate were placed at each foramen (level) into the transforaminal epidural space.   Additional Comments:  The patient tolerated the procedure well Dressing: 2 x 2 sterile gauze and Band-Aid    Post-procedure details: Patient was observed during the procedure. Post-procedure instructions were reviewed.  Patient left the clinic in stable condition.

## 2021-02-07 ENCOUNTER — Encounter (HOSPITAL_BASED_OUTPATIENT_CLINIC_OR_DEPARTMENT_OTHER): Payer: Medicare Other | Attending: Internal Medicine | Admitting: Internal Medicine

## 2021-02-07 ENCOUNTER — Other Ambulatory Visit: Payer: Self-pay

## 2021-02-07 DIAGNOSIS — M48062 Spinal stenosis, lumbar region with neurogenic claudication: Secondary | ICD-10-CM | POA: Insufficient documentation

## 2021-02-07 DIAGNOSIS — L97518 Non-pressure chronic ulcer of other part of right foot with other specified severity: Secondary | ICD-10-CM | POA: Diagnosis not present

## 2021-02-07 DIAGNOSIS — G6289 Other specified polyneuropathies: Secondary | ICD-10-CM | POA: Diagnosis not present

## 2021-02-07 DIAGNOSIS — L97512 Non-pressure chronic ulcer of other part of right foot with fat layer exposed: Secondary | ICD-10-CM | POA: Diagnosis not present

## 2021-02-07 NOTE — Progress Notes (Signed)
[name], Hanson ([id number]) Visit Report for 02/07/2021 Abuse/Suicide Risk Screen Details Patient Name: [name]: [name], [name] [name] 02/07/2021 2:45 PM Medical Record Number: [id number] Patient Account Number: [id number] Date of Birth/Sex: Treating RN: Dec 18, 1940 (80 y.o. [name] Primary Care Geoffery Aultman: [name] Other Clinician: Referring Lessie Manigo: Treating Franci Oshana/Extender: [name] Weeks in Treatment: 0 Abuse/Suicide Risk Screen Items Answer ABUSE RISK SCREEN: Has anyone close to you tried to hurt or harm you recentlyo No Do you feel uncomfortable with anyone in your familyo No Has anyone forced you do things that you didnt want to doo No Electronic Signature(s) Signed: 02/07/2021 5:36:16 PM By: Lorrin Jackson Entered By: Lorrin Jackson on 02/07/2021 15:21:54 -------------------------------------------------------------------------------- Activities of Daily Living Details Patient Name: [name]: [name], Hanson [name] 02/07/2021 2:45 PM Medical Record Number: [id number] Patient Account Number: [id number] Date of Birth/Sex: Treating RN: [birth year] (80 y.o. [name] Primary Care Corde Antonini: [name] Other Clinician: Referring Geri Hepler: Treating Rahima Fleishman/Extender: [name] Weeks in Treatment: 0 Activities of Daily Living Items Answer Activities of Daily Living (Please select one for each item) Drive Automobile Completely Able T Medications ake Completely Able Use T elephone Completely Able Care for Appearance Completely Able Use T oilet Completely Able Bath / Shower Completely Able Dress Self Completely Able Feed Self Completely Able Walk Completely Able Get In / Out Bed Completely Able Housework Completely Able Prepare Meals Completely [place] Completely Able Shop for Self Completely Able Electronic Signature(s) Signed: 02/07/2021 5:36:16 PM By: Lorrin Jackson Entered  By: Lorrin Jackson on 02/07/2021 15:22:18 -------------------------------------------------------------------------------- Education Screening Details Patient Name: [name]: [name] [name] 02/07/2021 2:45 PM Medical Record Number: [id number] Patient Account Number: [id number] Date of Birth/Sex: Treating RN: 20-Aug-1941 (80 y.o. [name] Primary Care Merle Cirelli: [name] Other Clinician: Referring Venora Kautzman: Treating Markeria Goetsch/Extender: [name] Weeks in Treatment: 0 Primary Learner Assessed: Patient Learning Preferences/Education Level/Primary Language Learning Preference: Explanation, Demonstration, Printed Material Highest Education Level: College or Above Preferred Language: English Cognitive Barrier Language Barrier: No Translator Needed: No Memory Deficit: No Emotional Barrier: No Cultural/Religious Beliefs Affecting Medical Care: No Physical Barrier Impaired Vision: No Impaired Hearing: No Decreased Hand dexterity: No Knowledge/Comprehension Knowledge Level: High Comprehension Level: High Ability to understand written instructions: High Ability to understand verbal instructions: High Motivation Anxiety Level: Calm Cooperation: Cooperative Education Importance: Acknowledges Need Interest in Health Problems: Asks Questions Perception: Coherent Willingness to Engage in Self-Management High Activities: Readiness to Engage in Self-Management High Activities: Electronic Signature(s) Signed: 02/07/2021 5:36:16 PM By: Lorrin Jackson Entered By: Lorrin Jackson on 02/07/2021 15:23:10 -------------------------------------------------------------------------------- Fall Risk Assessment Details Patient Name: [name]: [name] [name] 02/07/2021 2:45 PM Medical Record Number: [id number] Patient Account Number: [id number] Date of Birth/Sex: Treating RN: 1941-08-13 (80 y.o. [name] Primary Care [place] Searles:  [name] Other Clinician: Referring Lidia Clavijo: Treating Shaia Porath/Extender: [name] Weeks in Treatment: 0 Fall Risk Assessment Items Have you had 2 or more falls in the last 12 monthso 0 No Have you had any fall that resulted in injury in the last 12 monthso 0 No FALLS RISK SCREEN History of falling - immediate or within 3 months 0 No Secondary diagnosis (Do you have 2 or more medical diagnoseso) 0 No Ambulatory aid None/bed rest/wheelchair/nurse 0 Yes Crutches/cane/walker 0 No Furniture 0 No Intravenous therapy Access/Saline/Heparin Lock 0 No Gait/Transferring Normal/ bed rest/ wheelchair 0 Yes Weak (  short steps with or without shuffle, stooped but able to lift head while walking, may seek 0 No support from furniture) Impaired (short steps with shuffle, may have difficulty arising from chair, head down, impaired 0 No balance) Mental Status Oriented to own ability 0 Yes Electronic Signature(s) Signed: 02/07/2021 5:36:16 PM By: Lorrin Jackson Entered By: Lorrin Jackson on 02/07/2021 15:29:00 -------------------------------------------------------------------------------- Foot Assessment Details Patient Name: [name]: [name] [name] 02/07/2021 2:45 PM Medical Record Number: [id number] Patient Account Number: [id number] Date of Birth/Sex: Treating RN: [birth year] (80 y.o. [name] Primary Care Hiroto Saltzman: [name] Other Clinician: Referring Kemari Narez: Treating Syncere Kaminski/Extender: [name] Weeks in Treatment: 0 Foot Assessment Items Site Locations + = Sensation present, - = Sensation absent, C = Callus, U = Ulcer R = Redness, W = Warmth, M = Maceration, PU = Pre-ulcerative lesion F = Fissure, S = Swelling, D = Dryness Assessment Right: Left: Other Deformity: No No Prior Foot Ulcer: No No Prior Amputation: No No Charcot Joint: No No Ambulatory Status: Ambulatory Without Help Gait:  Steady Electronic Signature(s) Signed: 02/07/2021 5:36:16 PM By: Lorrin Jackson Entered By: Lorrin Jackson on 02/07/2021 15:33:09 -------------------------------------------------------------------------------- Nutrition Risk Screening Details Patient Name: [name]: [name] [name] 02/07/2021 2:45 PM Medical Record Number: [id number] Patient Account Number: [id number] Date of Birth/Sex: Treating RN: [birth year] (80 y.o. [name] Primary Care Ercell Razon: [name] Other Clinician: Referring Glema Takaki: Treating Verdene Creson/Extender: [name] Weeks in Treatment: 0 Height (in): Weight (lbs): Body Mass Index (BMI): Nutrition Risk Screening Items Score Screening NUTRITION RISK SCREEN: I have an illness or condition that made me change the kind and/or amount of food I eat 0 No I eat fewer than two meals per day 0 No I eat few fruits and vegetables, or milk products 0 No I have three or more drinks of beer, liquor or wine almost every day 0 No I have tooth or mouth problems that make it hard for me to eat 0 No I don't always have enough money to buy the food I need 0 No I eat alone most of the time 0 No I take three or more different prescribed or over-the-counter drugs a day 1 Yes Without wanting to, I have lost or gained 10 pounds in the last six months 0 No I am not always physically able to shop, cook and/or feed myself 0 No Nutrition Protocols Good Risk Protocol 0 No interventions needed Moderate Risk Protocol High Risk Proctocol Risk Level: Good Risk Score: 1 Electronic Signature(s) Signed: 02/07/2021 5:36:16 PM By: Lorrin Jackson Entered By: Lorrin Jackson on 02/07/2021 15:23:26

## 2021-02-07 NOTE — Progress Notes (Signed)
[name], [name] ([id number]) Visit Report for 02/07/2021 Allergy List Details Patient Name: [name]: [name], [name] [name] 02/07/2021 2:45 PM Medical Record Number: [id number] Patient Account Number: [id number] Date of Birth/Sex: Treating RN: 16-Mar-1941 (80 y.o. [name] Primary Care Lissette Schenk: [name] Other Clinician: Referring Karan Ramnauth: Treating Aviela Blundell/Extender: [name] Weeks in Treatment: 0 Allergies Active Allergies No Known Allergies Allergy Notes Electronic Signature(s) Signed: 02/07/2021 5:36:16 PM By: Lorrin Jackson Entered By: Lorrin Jackson on 02/07/2021 15:10:30 -------------------------------------------------------------------------------- Arrival Information Details Patient Name: [name]: [name] [name] 02/07/2021 2:45 PM Medical Record Number: [id number] Patient Account Number: [id number] Date of Birth/Sex: Treating RN: [birth year] (80 y.o. [name] Primary Care Batul Diego: [name] Other Clinician: Referring Josanne Boerema: Treating Keyoni Lapinski/Extender: [name] Weeks in Treatment: 0 Visit Information Patient Arrived: Ambulatory Arrival Time: 15:08 Transfer Assistance: None Patient Identification Verified: Yes Secondary Verification Process Completed: Yes Patient Requires Transmission-Based Precautions: No Patient Has Alerts: Yes Patient Alerts: R ABI non compressible Electronic Signature(s) Signed: 02/07/2021 5:36:16 PM By: Lorrin Jackson Entered By: Lorrin Jackson on 02/07/2021 15:46:45 -------------------------------------------------------------------------------- Clinic Level of Care Assessment Details Patient Name: [name]: [name], Hanson [name] 02/07/2021 2:45 PM Medical Record Number: [id number] Patient Account Number: [id number] Date of Birth/Sex: Treating RN: 08-May-1941 (80 y.o. [name] Primary Care Linn Goetze: [name] Other  Clinician: Referring Neilan Rizzo: Treating Brevin Mcfadden/Extender: [name] Weeks in Treatment: 0 Clinic Level of Care Assessment Items TOOL 1 Quantity Score X- 1 0 Use when EandM and Procedure is performed on INITIAL visit ASSESSMENTS - Nursing Assessment / Reassessment X- 1 20 General Physical Exam (combine w/ comprehensive assessment (listed just below) when performed on new pt. evals) X- 1 25 Comprehensive Assessment (HX, ROS, Risk Assessments, Wounds Hx, etc.) ASSESSMENTS - Wound and Skin Assessment / Reassessment X- 1 10 Dermatologic / Skin Assessment (not related to wound area) ASSESSMENTS - Ostomy and/or Continence Assessment and Care _0  - 0 Incontinence Assessment and Management _1  - 0 Ostomy Care Assessment and Management (repouching, etc.) PROCESS - Coordination of Care X - Simple Patient / Family Education for ongoing care 1 15 _2  - 0 Complex (extensive) Patient / Family Education for ongoing care X- 1 10 Staff obtains Programmer, systems, Records, T Results / Process Orders est _3  - 0 Staff telephones HHA, Nursing Homes / Clarify orders / etc _4  - 0 Routine Transfer to another Facility (non-emergent condition) _5  - 0 Routine Hospital Admission (non-emergent condition) X- 1 15 New Admissions / Biomedical engineer / Ordering NPWT Apligraf, etc. , _6  - 0 Emergency Hospital Admission (emergent condition) PROCESS - Special Needs _7  - 0 Pediatric / Minor Patient Management _8  - 0 Isolation Patient Management _9  - 0 Hearing / Language / Visual special needs _10  - 0 Assessment of Community assistance (transportation, D/C planning, etc.) _11  - 0 Additional assistance / Altered mentation _12  - 0 Support Surface(s) Assessment (bed, cushion, seat, etc.) INTERVENTIONS - Miscellaneous _13  - 0 External ear exam _14  - 0 Patient Transfer (multiple staff / Civil Service fast streamer / Similar devices) _15  - 0 Simple Staple / Suture removal (25 or less) _16  - 0 Complex  Staple / Suture removal (26 or more) _17  - 0 Hypo/Hyperglycemic Management (do not check if billed separately) X- 1 15 Ankle / Brachial Index (ABI) - do not check if billed separately Has the patient been seen at the hospital within the last three years: Yes Total Score: 110 Level  Of Care: New/Established - Level 3 Electronic Signature(s) Signed: 02/07/2021 5:47:39 PM By: Deon Pilling Entered By: Deon Pilling on 02/07/2021 17:12:38 -------------------------------------------------------------------------------- Encounter Discharge Information Details Patient Name: [name]: [name] [name] 02/07/2021 2:45 PM Medical Record Number: [id number] Patient Account Number: [id number] Date of Birth/Sex: Treating RN: 1941/01/10 (80 y.o. [name] Primary Care Tasmia Blumer: [name] Other Clinician: Referring Schae Cando: Treating Manna Gose/Extender: [name] Weeks in Treatment: 0 Encounter Discharge Information Items Post Procedure Vitals Discharge Condition: Stable Temperature (F): 98.4 Ambulatory Status: Ambulatory Pulse (bpm): 88 Discharge Destination: Home Respiratory Rate (breaths/min): 18 Transportation: Private Auto Blood Pressure (mmHg): 149/79 Accompanied By: self Schedule Follow-up Appointment: Yes Clinical Summary of Care: Patient Declined Electronic Signature(s) Signed: 02/07/2021 5:43:47 PM By: Baruch Gouty RN, BSN Entered By: Baruch Gouty on 02/07/2021 17:03:19 -------------------------------------------------------------------------------- Lower Extremity Assessment Details Patient Name: [name]: [name] [name] 02/07/2021 2:45 PM Medical Record Number: [id number] Patient Account Number: [id number] Date of Birth/Sex: Treating RN: 1940/12/15 (80 y.o. [name] Primary Care Iyanna Drummer: [name] Other Clinician: Referring Sheneka Schrom: Treating Toryn Mcclinton/Extender: [name] Weeks in Treatment: 0 Edema Assessment Assessed: [name]: No] [name]: Yes] Edema: [Left: Ye] [Right: s] Calf Left: Right: Point of Measurement: 33 cm From Medial Instep 41.2 cm Ankle Left: Right: Point of Measurement: 10 cm From Medial Instep 24 cm Vascular Assessment Pulses: Dorsalis Pedis Palpable: [Right:Yes] Notes Right ABI=Non compressible Electronic Signature(s) Signed: 02/07/2021 5:36:16 PM By: Lorrin Jackson Entered By: Lorrin Jackson on 02/07/2021 15:46:13 -------------------------------------------------------------------------------- Multi Wound Chart Details Patient Name: [name]: [name] [name] 02/07/2021 2:45 PM Medical Record Number: [id number] Patient Account Number: [id number] Date of Birth/Sex: Treating RN: April 15, 1941 (80 y.o. [name], [name] Primary Care Mitcheal Sweetin: [name] Other Clinician: Referring Edelin Fryer: Treating Elwin Tsou/Extender: [name] Weeks in Treatment: 0 Vital Signs Height(in): Pulse(bpm): 66 Weight(lbs): Blood Pressure(mmHg): 149/79 Body Mass Index(BMI): Temperature(F): 98.4 Respiratory Rate(breaths/min): 20 Photos: [1:No Photos Right T Great oe] [N/A:N/A N/A] Wound Location: [1:Other Lesion] [N/A:N/A] Wounding Event: [1:Lesion] [N/A:N/A] Primary Etiology: [1:Cataracts, Chronic sinus] [N/A:N/A] Comorbid History: [1:problems/congestion, Asthma, Sleep Apnea, Hypertension, Peripheral Venous Disease, Osteoarthritis 01/10/2020] [N/A:N/A] Date Acquired: [1:0] [N/A:N/A] Weeks of Treatment: [1:Open] [N/A:N/A] Wound Status: [1:1x1.2x0.2] [N/A:N/A] Measurements L x W x D (cm) [1:0.942] [N/A:N/A] A (cm) : rea [1:0.188] [N/A:N/A] Volume (cm) : [1:Full Thickness Without Exposed] [N/A:N/A] Classification: [1:Support Structures Medium] [N/A:N/A] Exudate A mount: [1:Serosanguineous] [N/A:N/A] Exudate Type: [1:red, brown] [N/A:N/A] Exudate Color: [1:Distinct, outline attached] [N/A:N/A] Wound Margin:  [1:Medium (34-66%)] [N/A:N/A] Granulation A mount: [1:Red] [N/A:N/A] Granulation Quality: [1:Medium (34-66%)] [N/A:N/A] Necrotic A mount: [1:Fat Layer (Subcutaneous Tissue): Yes N/A] Exposed Structures: [1:Fascia: No Tendon: No Muscle: No Joint: No Bone: No None] [N/A:N/A] Epithelialization: [1:Debridement - Excisional] [N/A:N/A] Debridement: Pre-procedure Verification/Time Out 16:05 [N/A:N/A] Taken: [1:Lidocaine 4% T opical Solution] [N/A:N/A] Pain Control: [1:Subcutaneous, Slough] [N/A:N/A] Tissue Debrided: [1:Skin/Subcutaneous Tissue] [N/A:N/A] Level: [1:2.25] [N/A:N/A] Debridement A (sq cm): [1:rea Curette] [N/A:N/A] Instrument: [1:Moderate] [N/A:N/A] Bleeding: [1:Silver Nitrate] [N/A:N/A] Hemostasis A chieved: [1:0] [N/A:N/A] Procedural Pain: [1:0] [N/A:N/A] Post Procedural Pain: [1:Procedure was tolerated well] [N/A:N/A] Debridement Treatment Response: [1:1x1.2x0.2] [N/A:N/A] Post Debridement Measurements L x W x D (cm) [1:0.188] [N/A:N/A] Post Debridement Volume: (cm) [1:Debridement] [N/A:N/A] Treatment Notes Wound #1 (Toe Great) Wound Laterality: Right Cleanser Wound Cleanser Discharge Instruction: Cleanse the wound with wound cleanser prior to applying a clean dressing using gauze sponges, not tissue or cotton balls. Peri-Wound Care Topical Primary Dressing Promogran Prisma Matrix, 4.34 (  sq in) (silver collagen) Discharge Instruction: Moisten collagen with saline or hydrogel Secondary Dressing Woven Gauze Sponges 2x2 in Discharge Instruction: Apply over primary dressing as directed. Secured With Conforming Stretch Gauze Bandage, Sterile 2x75 (in/in) Discharge Instruction: Secure with stretch gauze as directed. Compression Wrap Compression Stockings Add-Ons Electronic Signature(s) Signed: 02/07/2021 5:43:02 PM By: Linton Ham MD Signed: 02/07/2021 5:47:39 PM By: Deon Pilling Entered By: Linton Ham on 02/07/2021  17:03:48 -------------------------------------------------------------------------------- Multi-Disciplinary Care Plan Details Patient Name: [name]: [name] [name] 02/07/2021 2:45 PM Medical Record Number: [id number] Patient Account Number: [id number] Date of Birth/Sex: Treating RN: 10/14/1941 (80 y.o. [name], [name] Primary Care Allicia Culley: [name] Other Clinician: Referring Starlit Raburn: Treating Shavelle Runkel/Extender: [name] Weeks in Treatment: 0 Active Inactive Necrotic Tissue Nursing Diagnoses: Impaired tissue integrity related to necrotic/devitalized tissue Knowledge deficit related to management of necrotic/devitalized tissue Goals: Necrotic/devitalized tissue will be minimized in the wound bed Date Initiated: 02/07/2021 Target Resolution Date: 04/24/2021 Goal Status: Active Patient/caregiver will verbalize understanding of reason and process for debridement of necrotic tissue Date Initiated: 02/07/2021 Target Resolution Date: 02/15/2021 Goal Status: Active Interventions: Assess patient pain level pre-, during and post procedure and prior to discharge Provide education on necrotic tissue and debridement process Treatment Activities: T ordered outside of clinic : 02/07/2021 est Notes: Orientation to the Wound Care Program Nursing Diagnoses: Knowledge deficit related to the wound healing center program Goals: Patient/caregiver will verbalize understanding of the [place] Date Initiated: 02/07/2021 Target Resolution Date: 02/15/2021 Goal Status: Active Interventions: Provide education on orientation to the wound center Notes: Pain, Acute or Chronic Nursing Diagnoses: Pain, acute or chronic: actual or potential Potential alteration in comfort, pain Goals: Patient will verbalize adequate pain control and receive pain control interventions during procedures as needed Date Initiated: 02/07/2021 Target Resolution  Date: 02/15/2021 Goal Status: Active Patient/caregiver will verbalize comfort level met Date Initiated: 02/07/2021 Target Resolution Date: 02/15/2021 Goal Status: Active Interventions: Complete pain assessment as per visit requirements Provide education on pain management Reposition patient for comfort Treatment Activities: Administer pain control measures as ordered : 02/07/2021 Notes: Wound/Skin Impairment Nursing Diagnoses: Knowledge deficit related to ulceration/compromised skin integrity Goals: Patient/caregiver will verbalize understanding of skin care regimen Date Initiated: 02/07/2021 Target Resolution Date: 02/15/2021 Goal Status: Active Interventions: Assess patient/caregiver ability to obtain necessary supplies Assess patient/caregiver ability to perform ulcer/skin care regimen upon admission and as needed Provide education on ulcer and skin care Treatment Activities: Skin care regimen initiated : 02/07/2021 Topical wound management initiated : 02/07/2021 Notes: Electronic Signature(s) Signed: 02/07/2021 5:47:39 PM By: Deon Pilling Entered By: Deon Pilling on 02/07/2021 16:08:05 -------------------------------------------------------------------------------- Pain Assessment Details Patient Name: [name]: [name] [name] 02/07/2021 2:45 PM Medical Record Number: [id number] Patient Account Number: [id number] Date of Birth/Sex: Treating RN: Dec 11, 1940 (80 y.o. [name] Primary Care Maika Mcelveen: [name] Other Clinician: Referring Astor Gentle: Treating Katrice Goel/Extender: [name] Weeks in Treatment: 0 Active Problems Location of Pain Severity and Description of Pain Patient Has Paino No Site Locations Pain Management and Medication Current Pain Management: Electronic Signature(s) Signed: 02/07/2021 5:36:16 PM By: Lorrin Jackson Entered By: Lorrin Jackson on 02/07/2021  15:41:28 -------------------------------------------------------------------------------- Patient/Caregiver Education Details Patient Name: [name]: [name] RD Viona Gilmore 3/31/2022andnbsp2:45 PM Medical Record Number: [id number] Patient Account Number: [id number] Date of Birth/Gender: Treating RN: [birth year] (80 y.o. [name] Primary Care Physician: [name] Other Clinician: Referring Physician: Treating Physician/Extender: Cecil Cranker  W Weeks in Treatment: 0 Education Assessment Education Provided To: Patient Education Topics Provided Offloading: Handouts: What is Offloadingo Methods: Explain/Verbal, Printed Responses: Reinforcements needed [place]: o Handouts: Welcome T [facility] o Methods: Explain/Verbal, Printed Responses: Reinforcements needed Wound Debridement: Handouts: Wound Debridement Methods: Explain/Verbal, Printed Responses: Reinforcements needed Electronic Signature(s) Signed: 02/07/2021 5:47:39 PM By: Deon Pilling Signed: 02/07/2021 5:47:39 PM By: Deon Pilling Entered By: Deon Pilling on 02/07/2021 16:09:42 -------------------------------------------------------------------------------- Wound Assessment Details Patient Name: [name]: [name] [name] 02/07/2021 2:45 PM Medical Record Number: [id number] Patient Account Number: [id number] Date of Birth/Sex: Treating RN: [birth year] (80 y.o. [name] Primary Care Kanon Colunga: [name] Other Clinician: Referring Yamen Castrogiovanni: Treating Berk Pilot/Extender: [name] Weeks in Treatment: 0 Wound Status Wound Number: 1 Primary Neuropathic Ulcer-Non Diabetic Etiology: Wound Location: Right T Great oe Wound Open Wounding Event: Other Lesion Status: Date Acquired: 01/10/2020 Comorbid Cataracts, Chronic sinus problems/congestion, Asthma, Sleep Weeks Of Treatment: 0 History: Apnea,  Hypertension, Peripheral Venous Disease, Osteoarthritis Clustered Wound: No Photos Wound Measurements Length: (cm) 1 Width: (cm) 1.2 Depth: (cm) 0.2 Area: (cm) 0.942 Volume: (cm) 0.188 % Reduction in Area: 0% % Reduction in Volume: 0% Epithelialization: None Tunneling: No Undermining: No Wound Description Classification: Full Thickness Without Exposed Support Structures Wound Margin: Distinct, outline attached Exudate Amount: Medium Exudate Type: Serosanguineous Exudate Color: red, brown Foul Odor After Cleansing: No Slough/Fibrino Yes Wound Bed Granulation Amount: Medium (34-66%) Exposed Structure Granulation Quality: Red Fascia Exposed: No Necrotic Amount: Medium (34-66%) Fat Layer (Subcutaneous Tissue) Exposed: Yes Necrotic Quality: Adherent Slough Tendon Exposed: No Muscle Exposed: No Joint Exposed: No Bone Exposed: No Treatment Notes Wound #1 (Toe Great) Wound Laterality: Right Cleanser Wound Cleanser Discharge Instruction: Cleanse the wound with wound cleanser prior to applying a clean dressing using gauze sponges, not tissue or cotton balls. Peri-Wound Care Topical Primary Dressing Promogran Prisma Matrix, 4.34 (sq in) (silver collagen) Discharge Instruction: Moisten collagen with saline or hydrogel Secondary Dressing Woven Gauze Sponges 2x2 in Discharge Instruction: Apply over primary dressing as directed. Secured With Conforming Stretch Gauze Bandage, Sterile 2x75 (in/in) Discharge Instruction: Secure with stretch gauze as directed. Compression Wrap Compression Stockings Add-Ons Electronic Signature(s) Signed: 02/07/2021 5:28:26 PM By: Sandre Kitty Signed: 02/07/2021 5:36:16 PM By: Lorrin Jackson Entered By: Sandre Kitty on 02/07/2021 17:05:22 -------------------------------------------------------------------------------- Vitals Details Patient Name: [name]: [name] [name] 02/07/2021 2:45 PM Medical Record Number:  [id number] Patient Account Number: [id number] Date of Birth/Sex: Treating RN: 1941-11-09 (80 y.o. [name] Primary Care Haneen Bernales: [name] Other Clinician: Referring Raliegh Scobie: Treating Antwane Grose/Extender: [name] Weeks in Treatment: 0 Vital Signs Time Taken: 15:09 Temperature (F): 98.4 Pulse (bpm): 88 Respiratory Rate (breaths/min): 20 Blood Pressure (mmHg): 149/79 Reference Range: 80 - 120 mg / dl Electronic Signature(s) Signed: 02/07/2021 5:36:16 PM By: Lorrin Jackson Entered By: Lorrin Jackson on 02/07/2021 15:09:45

## 2021-02-07 NOTE — Progress Notes (Signed)
TRINI, CHRISTIANSEN ([id number]) Visit Report for 02/07/2021 Chief Complaint Document Details Patient Name: [name]: [name], [name] [name] 02/07/2021 2:45 PM Medical Record Number: [id number] Patient Account Number: [id number] Date of Birth/Sex: Treating RN: 1941-11-02 (80 y.o. [name] Primary Care Provider: [name] Other Clinician: Referring Provider: Treating Provider/Extender: [name] Weeks in Treatment: 0 Information Obtained from: Patient Chief Complaint 02/07/2021; patient is here for review of a wound on the tip of his right great toe Electronic Signature(s) Signed: 02/07/2021 5:43:02 PM By: Linton Ham MD Entered By: Linton Ham on 02/07/2021 17:06:56 -------------------------------------------------------------------------------- Debridement Details [name] 02/07/2021 2:45 PM Medical Record Number: [id number] Patient Account Number: [id number] Date of Birth/Sex: Treating RN: 1941-10-17 (80 y.o. [name] Primary Care Provider: [name] Other Clinician: Referring Provider: Treating Provider/Extender: [name] Weeks in Treatment: 0 Debridement Performed for Assessment: Wound #1 Right T Great oe Performed By: Physician Ricard Dillon., MD Debridement Type: Debridement Level of Consciousness (Pre-procedure): Responds to Painful Stimuli Pre-procedure Verification/Time Out Yes - 16:05 Taken: Start Time: 16:06 Pain Control: Lidocaine 4% T opical Solution T Area Debrided (L x W): otal 1.5 (cm) x 1.5 (cm) = 2.25 (cm) Tissue and other material debrided: Viable, Non-Viable, Slough, Subcutaneous, Skin: Dermis , Skin: Epidermis, Fibrin/Exudate, Slough Level: Skin/Subcutaneous Tissue Debridement Description: Excisional Instrument: Curette Bleeding: Moderate Hemostasis Achieved: Silver Nitrate End Time: 16:10 Procedural Pain: 0 Post Procedural  Pain: 0 Response to Treatment: Procedure was tolerated well Level of Consciousness (Post- Awake and Alert procedure): Post Debridement Measurements of Total Wound Length: (cm) 1 Width: (cm) 1.2 Depth: (cm) 0.2 Volume: (cm) 0.188 Character of Wound/Ulcer Post Debridement: Improved Post Procedure Diagnosis Same as Pre-procedure Electronic Signature(s) Signed: 02/07/2021 5:43:02 PM By: Linton Ham MD Signed: 02/07/2021 5:47:39 PM By: Deon Pilling Entered By: Linton Ham on 02/07/2021 17:03:59 -------------------------------------------------------------------------------- HPI Details [name] 02/07/2021 2:45 PM Medical Record Number: [id number] Patient Account Number: [id number] Date of Birth/Sex: Treating RN: [birth year] (80 y.o. [name] Primary Care Provider: [name] Other Clinician: Referring Provider: Treating Provider/Extender: [name] Weeks in Treatment: 0 History of Present Illness HPI Description: ADMISSION 02/07/2021 This is a 80 year old man who is not a diabetic "prediabetic" who comes in today letter asking [place] to look at an area on his right first toe tip that has been there about a year. He has been getting this dressed that [employer] and he had Xeroform on this with gauze. The patient is not exactly sure how this came about but he is not able to dress the wound himself because of limitations due to what sounds like spinal stenosis and pain. He had a wound on the ankle as well but that healed over. He was seen by vascular in May 2021. This was related to a right great toe wound. He had an angiogram that was completely normal on both sides good perfusion right into the feet. He also looks like he has lymphedema and he has compression stockings but he cannot get them on and off so he essentially leaves he is on even when he showering Past medical history includes prediabetes, low back pain  secondary I think the lumbar spinal stenosis has been offered a decompressive laminectomy but he has not gone forth with this. 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 ABI was noncompressible in our clinic on the right Electronic Signature(s) Signed: 02/07/2021 5:43:02 PM By: Linton Ham MD Entered By: Linton Ham on 02/07/2021 17:11:48 -------------------------------------------------------------------------------- Physical Exam Details [name] 02/07/2021 2:45 PM Medical Record Number: [id number] Patient Account Number: [id number] Date of Birth/Sex: Treating RN: Sep 02, 1941 (80 y.o. [name] Primary Care Provider: [name] Other Clinician: Referring Provider: Treating Provider/Extender: [name] Weeks in Treatment: 0 Constitutional Patient is hypertensive.. Pulse regular and within target range for patient.[name] Respirations regular, non-labored and within target range.. Temperature is normal and within the target range for the patient.[name] Appears in no distress. Respiratory work of breathing is normal. Cardiovascular Pedal pulses are normal on the right. Edema present in both extremities. This is nonpitting very dry scaly skin. Psychiatric appears at normal baseline. Notes Wound exam; the wound is on the tip of the right great toe. Nonviable necrotic surface which I debrided with a #3 curette this bled freely hemostasis with silver nitrate. He also had a lot of dry skin and necrotic debris around his nailbed I removed a lot of this and there is some mycotic nail I think growing back over this I did not investigate this further I do not think this is connected with the wound he is concerned about. Electronic Signature(s) Signed: 02/07/2021 5:43:02 PM By: Linton Ham MD Entered By: Linton Ham on 02/07/2021  17:11:02 -------------------------------------------------------------------------------- Physician Orders Details [name] 02/07/2021 2:45 PM Medical Record Number: [id number] Patient Account Number: [id number] Date of Birth/Sex: Treating RN: [birth year] (80 y.o. [name] Primary Care Provider: [name] Other Clinician: Referring Provider: Treating Provider/Extender: [name] Weeks in Treatment: 0 Verbal / Phone Orders: No Diagnosis Coding Follow-up Appointments ppointment in 1 week. - Thursday Return A Nurse Visit: - Monday or Tuesday Bathing/ Shower/ Hygiene May shower with protection but do not get wound dressing(s) wet. - use a cast protector. Edema Control - Lymphedema / SCD / Other Elevate legs to the level of the heart or above for 30 minutes daily and/or when sitting, a frequency of: - throughout the day. Avoid standing for long periods of time. Exercise regularly Moisturize legs daily. - both legs every night before bed. Off-Loading Open toe surgical shoe to: - wound center clinic to provide surgical shoe to patient to wear while walking and standing to right foot. Other: - Stop wearing the Croc slip on shoes. 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 Ensure no pressure of on the tip right great toe. Wound Treatment Wound #1 - T Great oe Wound Laterality: Right Cleanser: Wound Cleanser 2 x Per Week Discharge Instructions: Cleanse the wound with wound cleanser prior to applying a clean dressing using gauze sponges, not tissue or cotton balls. Prim Dressing: Promogran Prisma Matrix, 4.34 (sq in) (silver collagen) 2 x Per Week ary Discharge Instructions: Moisten collagen with saline or hydrogel Secondary Dressing: Woven Gauze Sponges 2x2 in 2 x Per Week Discharge Instructions: Apply over primary dressing as directed. Secured With: Child psychotherapist, Sterile 2x75 (in/in)  2 x Per Week Discharge Instructions: Secure with stretch gauze as directed. Radiology X-ray, toes right great toe - x-ray of right great toe related to nonhealing wound to toe. ICD code CPT code Patient Medications llergies: No Known Allergies A Notifications Medication Indication Start End lidocaine DOSE topical 4 % gel -  gel topical applied only in clinic. Electronic Signature(s) Signed: 02/07/2021 5:43:02 PM By: Linton Ham MD Signed: 02/07/2021 5:47:39 PM By: Deon Pilling Entered By: Deon Pilling on 02/07/2021 16:23:08 Prescription 02/07/2021 -------------------------------------------------------------------------------- Theora Master MD Patient Name: Provider: Nov 10, 1941 [id number] Date of Birth: NPI#: Jerilynn Mages [id number] Sex: DEA #: [phone] [id number] Phone #: License #: [employer] Patient Address: [address], [place] [address] [place], [place] [address] [phone] Allergies No Known Allergies Provider's Orders X-ray, toes right great toe - x-ray of right great toe related to nonhealing wound to toe. ICD code CPT code Hand Signature: Date(s): Prescription 02/07/2021 Marcy, Lisbeth Renshaw MD Patient Name: Provider: 06-09-1941 [id number] Date of Birth: NPI#: Jerilynn Mages [id number] Sex: DEA #: [phone] [id number] Phone #: License #: [place] Patient Address: [name], [place] [address] [place], [place] [address] [phone] Allergies No Known Allergies Medication Medication: Route: Strength: Form: lidocaine topical 4% gel Class: TOPICAL LOCAL ANESTHETICS Dose: Frequency / Time: Indication: gel topical applied only in clinic. Number of Refills: Number of Units: 0 Generic Substitution: Start Date: End Date: Administered at Facility: Substitution Permitted  Yes Time Administered: Time Discontinued: Note to Pharmacy: Hand Signature: Date(s): Electronic Signature(s) Signed: 02/07/2021 5:43:02 PM By: Linton Ham MD Signed: 02/07/2021 5:47:39 PM By: Deon Pilling Entered By: Deon Pilling on 02/07/2021 16:23:09 -------------------------------------------------------------------------------- Problem List Details [name] 02/07/2021 2:45 PM Medical Record Number: [id number] Patient Account Number: [id number] Date of Birth/Sex: Treating RN: 12/04/1940 (80 y.o. 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 02/07/2021 2:45 PM Medical Record Number: [id number] Patient Account Number: [id number] Date of Birth/Sex: Treating RN: May 17, 1941 (80 y.o. [name] Primary Care Provider: [name] Other Clinician: Referring Provider: Treating Provider/Extender: [name] Weeks in Treatment: 0 Subjective Chief Complaint Information obtained from Patient 02/07/2021; patient is here for review of a wound on the tip of his right great toe History of Present Illness (HPI) ADMISSION 02/07/2021 This is a 80 year old man who is not a diabetic "prediabetic" who comes in  today letter asking [place] to look at an area on his right first toe tip that has been there about a year. He has been getting this dressed that [employer] and he had Xeroform on this with gauze. The patient is not exactly sure how this came about but he is not able to dress the wound himself because of limitations due to what sounds like spinal stenosis and pain. He had a wound on the ankle as well but that healed over. He was seen by vascular in May 2021. This was related to a right great toe wound. He had an angiogram that was completely normal on both sides good perfusion right into the feet. He also looks like he has lymphedema and he has compression  stockings but he cannot get them on and off so he essentially leaves he is on even when he showering Past medical history includes prediabetes, low back pain secondary I think the lumbar spinal stenosis has been offered a decompressive laminectomy but he has not gone forth with this. 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 ABI was noncompressible in our clinic on the right Patient History Information obtained from Patient. Allergies No Known Allergies Family History Diabetes - Maternal Grandparents, Heart Disease - Father, Hypertension - Father, Stroke - Father, Thyroid Problems - Mother,Child, No family history of Cancer, Hereditary Spherocytosis, Kidney Disease, Lung Disease, Seizures, Tuberculosis. Social History Never smoker, Marital Status - Single, Alcohol Use - Rarely, Drug Use - No History, Caffeine Use - Daily - coffee. Medical History Eyes Patient has history of Cataracts - 2019 Ear/Nose/Mouth/Throat Patient has history of Chronic sinus problems/congestion Respiratory Patient has history of Asthma, Sleep Apnea Cardiovascular Patient has history of Hypertension, Peripheral Venous Disease Musculoskeletal Patient has history of Osteoarthritis Medical A Surgical History  Notes nd Gastrointestinal GERD Genitourinary BPH Review of Systems (ROS) Eyes Complains or has symptoms of Glasses / Contacts - Reading. Cardiovascular Denies complaints or symptoms of Chest pain. Gastrointestinal Denies complaints or symptoms of Frequent diarrhea, Nausea, Vomiting. Endocrine Denies complaints or symptoms of Heat/cold intolerance. Neurologic Denies complaints or symptoms of Numbness/parasthesias. Psychiatric Denies complaints or symptoms of Claustrophobia, Suicidal. Objective Constitutional Patient is hypertensive.. Pulse regular and within target range for patient.[name] Respirations regular, non-labored and within target range.. Temperature is normal and within the target range for the patient.[name] Appears in no distress. Vitals Time Taken: 3:09 PM, Temperature: 98.4 F, Pulse: 88 bpm, Respiratory Rate: 20 breaths/min, Blood Pressure: 149/79 mmHg. Respiratory work of breathing is normal. Cardiovascular Pedal pulses are normal on the right. Edema present in both extremities. This is nonpitting very dry scaly skin. Psychiatric appears at normal baseline. General Notes: Wound exam; the wound is on the tip of the right great toe. Nonviable necrotic surface which I debrided with a #3 curette this bled freely hemostasis with silver nitrate. ooHe also had a lot of dry skin and necrotic debris around his nailbed I removed a lot of this and there is some mycotic nail I think growing back over this I did not investigate this further I do not think this is connected with the wound he is concerned about. Integumentary (Hair, Skin) Wound #1 status is Open. Original cause of wound was Other Lesion. The date acquired was: 01/10/2020. The wound is located on the Right T Great. The wound oe measures 1cm length x 1.2cm width x 0.2cm depth; 0.942cm^2 area and 0.188cm^3 volume. There is Fat Layer (Subcutaneous Tissue) exposed. There is no tunneling or undermining noted. There is a medium  amount of serosanguineous drainage noted. The wound margin is distinct with the outline attached to the wound base. There is medium (34-66%) red granulation within the wound bed. There is a medium (34-66%) amount of necrotic tissue within the wound bed including Adherent Slough. 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 There was a Excisional Skin/Subcutaneous Tissue oe Debridement with a total area of 2.25 sq cm performed by Ricard Dillon., MD. With the following  instrument(s): Curette to remove Viable and Non-Viable tissue/material. Material removed includes Subcutaneous Tissue, Slough, Skin: Dermis, Skin: Epidermis, and Fibrin/Exudate after achieving pain control using Lidocaine 4% T opical Solution. A time out was conducted at 16:05, prior to the start of the procedure. A Moderate amount of bleeding was controlled with Silver Nitrate. The procedure was tolerated well with a pain level of 0 throughout and a pain level of 0 following the procedure. Post Debridement Measurements: 1cm length x 1.2cm width x 0.2cm depth; 0.188cm^3 volume. Character of Wound/Ulcer Post Debridement is improved. Post procedure Diagnosis Wound #1: Same as Pre-Procedure Plan Follow-up Appointments: Return Appointment in 1 week. - Thursday Nurse Visit: - Monday or Tuesday Bathing/ Shower/ Hygiene: May shower with protection but do not get wound dressing(s) wet. - use a cast protector. Edema Control - Lymphedema / SCD / Other: Elevate legs to the level of the heart or above for 30 minutes daily and/or when sitting, a frequency of: - throughout the day. Avoid standing for long periods of time. Exercise regularly Moisturize legs daily. - both legs every  night before bed. Off-Loading: Open toe surgical shoe to: - wound center clinic to provide surgical shoe to patient to wear while walking and standing to right foot. Other: - Stop wearing the Croc slip on shoes. 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 Ensure no pressure of on the tip right great toe. Radiology ordered were: X-ray, toes right great toe - x-ray of right great toe related to nonhealing wound to toe. ICD code CPT code The following medication(s) was prescribed: lidocaine topical 4 % gel gel topical applied only in clinic. was prescribed at facility WOUND #1: - T Great Wound Laterality: Right oe Cleanser: Wound Cleanser 2 x Per Week/ Discharge Instructions: Cleanse the wound with wound cleanser prior to applying a clean dressing using gauze sponges, not tissue or cotton balls. Prim Dressing: Promogran Prisma Matrix, 4.34 (sq in) (silver collagen) 2 x Per Week/ ary Discharge Instructions: Moisten collagen with saline or hydrogel Secondary Dressing: Woven Gauze Sponges 2x2 in 2 x Per Week/ Discharge Instructions: Apply over primary dressing as directed. Secured With: Child psychotherapist, Sterile 2x75 (in/in) 2 x Per Week/ Discharge Instructions: Secure with stretch gauze as directed. Moistened silver collagen kerlix and a toe sock ooGoing to x-ray the right great toe as well. This wound has been present for more than a year. He episodically complains of pain in the area although this may be neuropathic ooThe patient already tells [place] that he cannot dress this himself and he lives alone. Furthermore he came into the clinic in crocs and these are notorious for causing wounds on the tips of the toes secondary to movement of the feet and abrasion against the front of the shoe. We gave him a surgical sandal will see if he can wear this. 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 I simply do not see a way around this I spent 35 minutes to review this patient's past medical history, face-to-face evaluation and preparation of this record Electronic Signature(s) Signed: 02/07/2021 5:43:02 PM By: Linton Ham MD Entered By: Linton Ham on 02/07/2021 17:13:35 -------------------------------------------------------------------------------- HxROS Details [name] 02/07/2021 2:45 PM Medical Record Number: [id number] Patient Account Number:  [id number] Date of Birth/Sex: Treating RN: [birth year] (80 y.o. Marcheta Hanson Primary Care Provider: [name] Other Clinician: Referring Provider: Treating Provider/Extender: [name] Weeks in Treatment: 0 Information Obtained From Patient Eyes Complaints and Symptoms: Positive for: Glasses / Contacts - Reading Medical History: Positive for: Cataracts - 2019 Cardiovascular Complaints and Symptoms: Negative for: Chest pain Medical History: Positive for: Hypertension; Peripheral Venous Disease Gastrointestinal Complaints and Symptoms: Negative for: Frequent diarrhea; Nausea; Vomiting Medical History: Past Medical History Notes: GERD Endocrine Complaints and Symptoms: Negative for: Heat/cold intolerance Neurologic Complaints and Symptoms: Negative for: Numbness/parasthesias Psychiatric Complaints and Symptoms: Negative for: Claustrophobia; Suicidal Ear/Nose/Mouth/Throat Medical History: Positive for: Chronic sinus problems/congestion Hematologic/Lymphatic Respiratory Medical History: Positive for: Asthma; Sleep Apnea Genitourinary Medical History: Past Medical History Notes: BPH Immunological Integumentary (Skin) Musculoskeletal Medical History: Positive for: Osteoarthritis Oncologic HBO Extended History  Items Ear/Nose/Mouth/Throat: Eyes: Chronic sinus Cataracts problems/congestion Immunizations Pneumococcal Vaccine: Received Pneumococcal Vaccination: Yes Implantable Devices None Family and Social History Cancer: No; Diabetes: Yes - Maternal Grandparents; Heart Disease: Yes - Father; Hereditary Spherocytosis: No; Hypertension: Yes - Father; Kidney Disease: No; Lung Disease: No; Seizures: No; Stroke: Yes - Father; Thyroid Problems: Yes - Mother,Child; Tuberculosis: No; Never smoker; Marital Status - Single; Alcohol Use: Rarely; Drug Use: No History; Caffeine Use: Daily - coffee; Financial Concerns: No; Food, Clothing or Shelter Needs: No; Support System Lacking: No; Transportation Concerns: No Electronic Signature(s) Signed: 02/07/2021 5:36:16 PM By: Lorrin Jackson Signed: 02/07/2021 5:43:02 PM By: Linton Ham MD Entered By: Lorrin Jackson on 02/07/2021 15:21:46 -------------------------------------------------------------------------------- SuperBill Details [name] 02/07/2021 Medical Record Number: [id number] Patient Account Number: [id number] Date of Birth/Sex: Treating RN: Mar 07, 1941 (80 y.o. 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, lumbar region with neurogenic claudication Facility Procedures CPT4 Code: [id number] Description: [facility] VISIT-LEV 3 EST PT Modifier: Quantity: 1 CPT4 Code: [id number] Description: 02542 - DEB SUBQ TISSUE 20 SQ CM/< ICD-10 Diagnosis Description L97.518 Non-pressure chronic ulcer of other part of right foot with other specified sever Modifier: ity Quantity: 1 Physician Procedures : CPT4 Code  Description Modifier [id number] WC PHYS LEVEL 3 NEW PT 25 ICD-10 Diagnosis Description L97.518 Non-pressure chronic ulcer of other part of right foot with other specified severity M48.062 Spinal stenosis, lumbar region with neurogenic  claudication [id number] - WC PHYS SUBQ TISS 20 SQ CM 1 ICD-10 Diagnosis Description L97.518 Non-pressure chronic ulcer of other part of right foot with other specified severity Quantity: 1 Electronic Signature(s) Signed: 02/07/2021 5:43:02 PM By: Linton Ham MD Entered By: Linton Ham on 02/07/2021 17:14:39

## 2021-02-08 ENCOUNTER — Other Ambulatory Visit: Payer: Self-pay | Admitting: Physician Assistant

## 2021-02-08 ENCOUNTER — Other Ambulatory Visit (HOSPITAL_COMMUNITY): Payer: Medicare Other

## 2021-02-08 ENCOUNTER — Other Ambulatory Visit: Payer: Self-pay

## 2021-02-08 ENCOUNTER — Encounter (HOSPITAL_BASED_OUTPATIENT_CLINIC_OR_DEPARTMENT_OTHER)
Admission: RE | Admit: 2021-02-08 | Discharge: 2021-02-08 | Disposition: A | Discharge status: Home / Self Care | Payer: Medicare Other | Source: Ambulatory Visit | Attending: Orthopedic Surgery | Admitting: Orthopedic Surgery

## 2021-02-08 ENCOUNTER — Encounter (HOSPITAL_BASED_OUTPATIENT_CLINIC_OR_DEPARTMENT_OTHER): Payer: Self-pay | Admitting: Orthopedic Surgery

## 2021-02-08 DIAGNOSIS — Z01812 Encounter for preprocedural laboratory examination: Secondary | ICD-10-CM | POA: Insufficient documentation

## 2021-02-08 LAB — BASIC METABOLIC PANEL
Anion gap: 8 (ref 5–15)
BUN: 21 mg/dL (ref 8–23)
CO2: 27 mmol/L (ref 22–32)
Calcium: 9.1 mg/dL (ref 8.9–10.3)
Chloride: 102 mmol/L (ref 98–111)
Creatinine, Ser: 1.77 mg/dL — ABNORMAL HIGH (ref 0.61–1.24)
GFR, Estimated: 39 mL/min — ABNORMAL LOW (ref >60)
Glucose, Bld: 112 mg/dL — ABNORMAL HIGH (ref 70–99)
Potassium: 4 mmol/L (ref 3.5–5.1)
Sodium: 137 mmol/L (ref 135–145)

## 2021-02-08 NOTE — Progress Notes (Signed)

## 2021-02-09 ENCOUNTER — Other Ambulatory Visit (HOSPITAL_COMMUNITY): Payer: Medicare Other | Attending: Orthopedic Surgery

## 2021-02-11 ENCOUNTER — Other Ambulatory Visit (HOSPITAL_COMMUNITY)
Admission: RE | Admit: 2021-02-11 | Discharge: 2021-02-11 | Disposition: A | Discharge status: Home / Self Care | Payer: Medicare Other | Source: Ambulatory Visit | Attending: Orthopedic Surgery | Admitting: Orthopedic Surgery

## 2021-02-11 ENCOUNTER — Encounter (HOSPITAL_BASED_OUTPATIENT_CLINIC_OR_DEPARTMENT_OTHER): Payer: Medicare Other | Attending: Internal Medicine | Admitting: Internal Medicine

## 2021-02-11 ENCOUNTER — Encounter (HOSPITAL_COMMUNITY): Payer: Self-pay | Admitting: Anesthesiology

## 2021-02-11 ENCOUNTER — Other Ambulatory Visit: Payer: Self-pay

## 2021-02-11 DIAGNOSIS — Z01812 Encounter for preprocedural laboratory examination: Secondary | ICD-10-CM | POA: Insufficient documentation

## 2021-02-11 DIAGNOSIS — M48062 Spinal stenosis, lumbar region with neurogenic claudication: Secondary | ICD-10-CM | POA: Insufficient documentation

## 2021-02-11 DIAGNOSIS — L97518 Non-pressure chronic ulcer of other part of right foot with other specified severity: Secondary | ICD-10-CM | POA: Insufficient documentation

## 2021-02-11 DIAGNOSIS — Z20822 Contact with and (suspected) exposure to covid-19: Secondary | ICD-10-CM | POA: Diagnosis not present

## 2021-02-11 LAB — SARS CORONAVIRUS 2 (TAT 6-24 HRS): SARS Coronavirus 2: NEGATIVE

## 2021-02-11 NOTE — Progress Notes (Addendum)
Text reminder sent to patient to go for covid test today before 3pm in order to have surgery tomorrow. Called Dr Jess Barters office and left message on VM for Malachy Mood to inform that patient had not yet gone for covid testing, and advised that if test not collected by end of day, case would be dropped to the depot since it is a 7:30 case.

## 2021-02-12 ENCOUNTER — Ambulatory Visit (HOSPITAL_BASED_OUTPATIENT_CLINIC_OR_DEPARTMENT_OTHER): Admission: RE | Admit: 2021-02-12 | Payer: Medicare Other | Source: Home / Self Care | Admitting: Orthopedic Surgery

## 2021-02-12 SURGERY — CARPAL TUNNEL RELEASE
Anesthesia: Choice | Laterality: Left

## 2021-02-12 MED ORDER — LIDOCAINE HCL (PF) 1 % IJ SOLN
INTRAMUSCULAR | Status: AC
  Filled 2021-02-12: qty 30

## 2021-02-12 NOTE — H&P (Signed)
[name] is an 80 y.o. male.   Chief Complaint: left carpal tunnel Syndrome HPI: Patient is a 80 year old gentleman who presents in follow-up status post nerve conduction studies of both upper extremities.  Patient states he has most pain and symptoms in the left upper extremity compared to the right he states that yesterday he had some increased pain in the right hand.  Patient states that he occasionally has pain over the iliac crest on the right and massage has decreased the symptoms.  Past Medical History:  Diagnosis Date  . Allergy    takes Zyrtec daily  . Anxiety    takes Ativan daily as needed  . Arthritis   . Asthma   . Back pain   . BPH (benign prostatic hyperplasia)    takes doxazosin for  . Carpal tunnel syndrome   . 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 Depression    takes Wellbutrin daily  . Dyspnea on exertion    with exertion  . GERD (gastroesophageal reflux disease)    takes Nexium and Omeprazole daily  . History of kidney stones   . HTN (hypertension)   . 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 Hyperlipidemia   . Joint pain   . Neuromuscular disorder ([place])   . Prediabetes   . Sleep apnea    uses CPAP nightly  . SOB (shortness of breath)   . Swallowing difficulty   . Swelling of lower extremity    right more than leg leg  . Umbilical hernia     Past Surgical History:  Procedure Laterality Date  . ABDOMINAL AORTOGRAM W/LOWER EXTREMITY N/A 03/14/2020   Procedure: ABDOMINAL AORTOGRAM W/LOWER EXTREMITY;  Surgeon: [name] Mitchell, MD;  Location: [facility];  Service: Vascular;  Laterality: N/A;  . 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 BACK SURGERY  2003   L4, L5  . BACK SURGERY    . CATARACT EXTRACTION, BILATERAL    . ENDOVENOUS ABLATION SAPHENOUS VEIN W/ LASER Right 06-01-2014   EVLA right greater saphenous vein by [name] Jews MD   . epidural steroid injections        [facility] dr [name]  .  hernia repair  2003  . HERNIA REPAIR     hernia inguinal x 2  . TOTAL KNEE ARTHROPLASTY Left 03/22/2015   Procedure: TOTAL KNEE ARTHROPLASTY;  Surgeon: [name] Pel, MD;  Location: [facility];  Service: Orthopedics;  Laterality: Left;  . UMBILICAL HERNIA REPAIR N/A 06/27/2016   Procedure: LAPAROSCOPIC ASSISTED REPAIR OF UMBILICAL HERINA WITH MESH;  Surgeon: [name] Hausen, MD;  Location: [facility];  Service: General;  Laterality: N/A;  . VASCULAR SURGERY  2015   right leg    Family History  Problem Relation Age of Onset  . Thyroid disease Mother   . Heart disease Father   . Hypertension Father   . Diabetes Maternal Grandfather   . Colon cancer Neg Hx   . Esophageal cancer Neg Hx    Social History:  reports that he has never smoked. He has never used smokeless tobacco. He reports current alcohol use. He reports that he does not use drugs.  Allergies: No Known Allergies  No medications prior to admission.    Results for orders placed or performed during the hospital encounter of 02/11/21 (from the past 48 hour(s))  SARS CORONAVIRUS 2 (TAT 6-24 HRS) Nasopharyngeal Nasopharyngeal Swab  Status: None   Collection Time: 02/11/21 12:24 PM   Specimen: Nasopharyngeal Swab  Result Value Ref Range   SARS Coronavirus 2 NEGATIVE NEGATIVE    Comment: (NOTE) SARS-CoV-2 target nucleic acids are NOT DETECTED.  The SARS-CoV-2 RNA is generally detectable in upper and lower respiratory specimens during the acute phase of infection. Negative results do not preclude SARS-CoV-2 infection, do not rule out co-infections with other pathogens, and should not be used as the sole basis for treatment or other patient management decisions. Negative results must be combined with clinical observations, patient history, and epidemiological information. The expected result is Negative.  Fact Sheet for Patients: SugarRoll.be  Fact Sheet for Healthcare  Providers: https://www.woods-mathews.com/  This test is not yet approved or cleared by the [place] FDA and  has been authorized for detection and/or diagnosis of SARS-CoV-2 by FDA under an Emergency Use Authorization (EUA). This EUA will remain  in effect (meaning this test can be used) for the duration of the COVID-19 declaration under Se ction 564(b)(1) of the Act, 21 U.S.C. section 360bbb-3(b)(1), unless the authorization is terminated or revoked sooner.  Performed at [facility], [place] [address] [place], [place] [address]    No results found.  Review of Systems  All other systems reviewed and are negative.   Height 5\' 7"  (1.702 m), weight 114.8 kg. Physical Exam  Patient is alert, oriented, no adenopathy, well-dressed, normal affect, normal respiratory effort. Examination patient has a slow antalgic gait difficulty getting from a sitting.  Wound and positioning.  He has decreased grip strength bilaterally with a positive Phalen's and Tinel's test bilaterally.  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  No evidence of other focal nerve neuropathy.Heart RRR Lungs Clear Assessment/Plan   Plan: Discussed that with the fusion patient would have to be able to fully weight-bear through his hands to use a walker recommended proceeding with the carpal tunnel release on the left prior to considering the L1 S1 fusion.  Risks and benefits of surgery were discussed including infection neurovascular injury need for additional surgery.  Would plan for outpatient surgery at [facility] day surgery patient would like to set this up at his convenience. Bevely Hanson [name] Hogen, PA 02/12/2021, 6:40 AM

## 2021-02-13 ENCOUNTER — Ambulatory Visit
Admission: RE | Admit: 2021-02-13 | Discharge: 2021-02-13 | Disposition: A | Discharge status: Home / Self Care | Payer: Medicare Other | Source: Ambulatory Visit | Attending: Physician Assistant | Admitting: Physician Assistant

## 2021-02-13 ENCOUNTER — Other Ambulatory Visit: Payer: Self-pay | Admitting: Physician Assistant

## 2021-02-13 ENCOUNTER — Ambulatory Visit: Payer: Medicare Other | Admitting: Podiatry

## 2021-02-13 DIAGNOSIS — L97519 Non-pressure chronic ulcer of other part of right foot with unspecified severity: Secondary | ICD-10-CM | POA: Diagnosis not present

## 2021-02-13 DIAGNOSIS — M7989 Other specified soft tissue disorders: Secondary | ICD-10-CM | POA: Diagnosis not present

## 2021-02-13 DIAGNOSIS — L97518 Non-pressure chronic ulcer of other part of right foot with other specified severity: Secondary | ICD-10-CM

## 2021-02-13 DIAGNOSIS — L98499 Non-pressure chronic ulcer of skin of other sites with unspecified severity: Secondary | ICD-10-CM | POA: Diagnosis not present

## 2021-02-13 NOTE — Progress Notes (Signed)
[name], [name] ([id number]) Visit Report for 02/11/2021 SuperBill Details Patient Name: [name]: [name], [name] [name] 02/11/2021 Medical Record Number: [id number] Patient Account Number: [id number] Date of Birth/Sex: Treating RN: 04-May-1941 (80 y.o. 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

## 2021-02-13 NOTE — Progress Notes (Signed)
[name], [name] ([id number]) Visit Report for 02/11/2021 Arrival Information Details Patient Name: [name]: [name], [name] [name] 02/11/2021 11:15 A M Medical Record Number: [id number] Patient Account Number: [id number] Date of Birth/Sex: Treating RN: 1940/11/11 (80 y.o. [name] Primary Care [name]: [name] Other Clinician: Referring [name]: Treating [name]/Extender: [name] Weeks in Treatment: 0 Visit Information History Since Last Visit Added or deleted any medications: No Patient Arrived: Ambulatory Any new allergies or adverse reactions: No Arrival Time: 11:08 Had a fall or experienced change in No Accompanied By: self activities of daily living that may affect Transfer Assistance: None risk of falls: Patient Identification Verified: Yes Signs or symptoms of abuse/neglect since last visito No Secondary Verification Process Completed: Yes Hospitalized since last visit: No Patient Requires Transmission-Based Precautions: No Implantable device outside of the clinic excluding No Patient Has Alerts: Yes cellular tissue based products placed in the center Patient Alerts: R ABI non compressible since last visit: Has Dressing in Place as Prescribed: Yes Pain Present Now: No Electronic Signature(s) Signed: 02/11/2021 4:59:21 PM By: Lorrin Jackson Entered By: Lorrin Jackson on 02/11/2021 11:16:10 -------------------------------------------------------------------------------- Clinic Level of Care Assessment Details Patient Name: [name]: [name] [name] 02/11/2021 11:15 A M Medical Record Number: [id number] Patient Account Number: [id number] Date of Birth/Sex: Treating RN: 05/02/1941 (80 y.o. [name], [name] Primary Care Ardeth Repetto: [name] Other Clinician: Referring [name]: Treating [name]/Extender: [name] Weeks in Treatment: 0 Clinic Level of Care Assessment Items TOOL 4  Quantity Score X- 1 0 Use when only an EandM is performed on FOLLOW-UP visit ASSESSMENTS - Nursing Assessment / Reassessment X- 1 10 Reassessment of Co-morbidities (includes updates in patient status) X- 1 5 Reassessment of Adherence to Treatment Plan ASSESSMENTS - Wound and Skin A ssessment / Reassessment X - Simple Wound Assessment / Reassessment - one wound 1 5 []  - 0 Complex Wound Assessment / Reassessment - multiple wounds X- 1 10 Dermatologic / Skin Assessment (not related to wound area) ASSESSMENTS - Focused Assessment X- 1 5 Circumferential Edema Measurements - multi extremities X- 1 10 Nutritional Assessment / Counseling / Intervention []  - 0 Lower Extremity Assessment (monofilament, tuning fork, pulses) []  - 0 Peripheral Arterial Disease Assessment (using hand held doppler) ASSESSMENTS - Ostomy and/or Continence Assessment and Care []  - 0 Incontinence Assessment and Management []  - 0 Ostomy Care Assessment and Management (repouching, etc.) PROCESS - Coordination of Care X - Simple Patient / Family Education for ongoing care 1 15 []  - 0 Complex (extensive) Patient / Family Education for ongoing care X- 1 10 Staff obtains Programmer, systems, Records, T Results / Process Orders est []  - 0 Staff telephones HHA, Nursing Homes / Clarify orders / etc []  - 0 Routine Transfer to another Facility (non-emergent condition) []  - 0 Routine Hospital Admission (non-emergent condition) []  - 0 New Admissions / Biomedical engineer / Ordering NPWT Apligraf, etc. , []  - 0 Emergency Hospital Admission (emergent condition) X- 1 10 Simple Discharge Coordination []  - 0 Complex (extensive) Discharge Coordination PROCESS - Special Needs []  - 0 Pediatric / Minor Patient Management []  - 0 Isolation Patient Management []  - 0 Hearing / Language / Visual special needs []  - 0 Assessment of Community assistance (transportation, D/C planning, etc.) []  - 0 Additional assistance / Altered  mentation []  - 0 Support Surface(s) Assessment (bed, cushion, seat, etc.) INTERVENTIONS - Wound Cleansing / Measurement X - Simple Wound Cleansing - one wound 1  5 []  - 0 Complex Wound Cleansing - multiple wounds X- 1 5 Wound Imaging (photographs - any number of wounds) []  - 0 Wound Tracing (instead of photographs) X- 1 5 Simple Wound Measurement - one wound []  - 0 Complex Wound Measurement - multiple wounds INTERVENTIONS - Wound Dressings X - Small Wound Dressing one or multiple wounds 1 10 []  - 0 Medium Wound Dressing one or multiple wounds []  - 0 Large Wound Dressing one or multiple wounds []  - 0 Application of Medications - topical []  - 0 Application of Medications - injection INTERVENTIONS - Miscellaneous []  - 0 External ear exam []  - 0 Specimen Collection (cultures, biopsies, blood, body fluids, etc.) []  - 0 Specimen(s) / Culture(s) sent or taken to Lab for analysis []  - 0 Patient Transfer (multiple staff / Civil Service fast streamer / Similar devices) []  - 0 Simple Staple / Suture removal (25 or less) []  - 0 Complex Staple / Suture removal (26 or more) []  - 0 Hypo / Hyperglycemic Management (close monitor of Blood Glucose) []  - 0 Ankle / Brachial Index (ABI) - do not check if billed separately X- 1 5 Vital Signs Has the patient been seen at the hospital within the last three years: Yes Total Score: 110 Level Of Care: New/Established - Level 3 Electronic Signature(s) Signed: 02/13/2021 5:28:50 PM By: Rhae Hammock RN Entered By: Rhae Hammock on 02/11/2021 11:53:44 -------------------------------------------------------------------------------- Encounter Discharge Information Details Patient Name: [name]: [name] [name] 02/11/2021 11:15 A M Medical Record Number: [id number] Patient Account Number: [id number] Date of Birth/Sex: Treating RN: 07/18/1941 (80 y.o. [name], [name] Primary Care [name]: [name] Other Clinician: Referring  [name]: Treating Danyle Boening/Extender: [name] Weeks in Treatment: 0 Encounter Discharge Information Items Discharge Condition: Stable Ambulatory Status: Ambulatory Discharge Destination: Home Transportation: Private Auto Accompanied By: self Schedule Follow-up Appointment: Yes Clinical Summary of Care: Patient Declined Electronic Signature(s) Signed: 02/13/2021 5:28:50 PM By: Rhae Hammock RN Entered By: Rhae Hammock on 02/11/2021 11:52:05 -------------------------------------------------------------------------------- Patient/Caregiver Education Details Patient Name: [name]: [name] [name] 4/4/2022andnbsp11:15 A M Medical Record Number: [id number] Patient Account Number: [id number] Date of Birth/Gender: Treating RN: 07-Nov-1941 (80 y.o. [name] Primary Care Physician: [name] Other Clinician: Referring Physician: Treating Physician/Extender: Leonard Downing in Treatment: 0 Education Assessment Education Provided To: Patient Education Topics Provided Welcome T [facility]: o Methods: Explain/Verbal Responses: State content correctly Electronic Signature(s) Signed: 02/13/2021 5:28:50 PM By: Rhae Hammock RN Entered By: Rhae Hammock on 02/11/2021 11:51:52 -------------------------------------------------------------------------------- Wound Assessment Details Patient Name: [name]: [name] [name] 02/11/2021 11:15 A M Medical Record Number: [id number] Patient Account Number: [id number] Date of Birth/Sex: Treating RN: 07-30-1941 (80 y.o. [name] Primary Care Renda Pohlman: [name] Other Clinician: Referring Khloey Chern: Treating Ivory Bail/Extender: [name] Weeks in Treatment: 0 Wound Status Wound Number: 1 Primary Etiology: Neuropathic Ulcer-Non Diabetic Wound Location: Right T Great oe Wound Status: Open Wounding  Event: Other Lesion Date Acquired: 01/10/2020 Weeks Of Treatment: 0 Clustered Wound: No Wound Measurements Length: (cm) 1 Width: (cm) 1.2 Depth: (cm) 0.2 Area: (cm) 0.942 Volume: (cm) 0.188 % Reduction in Area: 0% % Reduction in Volume: 0% Wound Description Classification: Full Thickness Without Exposed Support Structur es Treatment Notes Wound #1 (Toe Great) Wound Laterality: Right Cleanser Wound Cleanser Discharge Instruction: Cleanse the wound with wound cleanser prior to applying a clean dressing using gauze sponges, not tissue or cotton  balls. Peri-Wound Care Topical Primary Dressing Promogran Prisma Matrix, 4.34 (sq in) (silver collagen) Discharge Instruction: Moisten collagen with saline or hydrogel Secondary Dressing Woven Gauze Sponges 2x2 in Discharge Instruction: Apply over primary dressing as directed. Secured With Conforming Stretch Gauze Bandage, Sterile 2x75 (in/in) Discharge Instruction: Secure with stretch gauze as directed. Compression Wrap Compression Stockings Add-Ons Electronic Signature(s) Signed: 02/11/2021 4:59:21 PM By: Lorrin Jackson Entered By: Lorrin Jackson on 02/11/2021 11:16:21 -------------------------------------------------------------------------------- Vitals Details Patient Name: [name]: [name] [name] 02/11/2021 11:15 A M Medical Record Number: [id number] Patient Account Number: [id number] Date of Birth/Sex: Treating RN: 1941/08/09 (80 y.o. [name] Primary Care Akaisha Truman: [name] Other Clinician: Referring Law Corsino: Treating Danne Vasek/Extender: [name] Weeks in Treatment: 0 Vital Signs Time Taken: 11:17 Temperature (F): 97.6 Pulse (bpm): 67 Respiratory Rate (breaths/min): 20 Blood Pressure (mmHg): 183/83 Reference Range: 80 - 120 mg / dl Electronic Signature(s) Signed: 02/11/2021 4:59:21 PM By: Lorrin Jackson Entered By: Lorrin Jackson on 02/11/2021 11:17:24

## 2021-02-14 ENCOUNTER — Encounter (HOSPITAL_BASED_OUTPATIENT_CLINIC_OR_DEPARTMENT_OTHER): Payer: Medicare Other | Admitting: Internal Medicine

## 2021-02-14 ENCOUNTER — Other Ambulatory Visit: Payer: Self-pay

## 2021-02-14 DIAGNOSIS — G5601 Carpal tunnel syndrome, right upper limb: Secondary | ICD-10-CM | POA: Diagnosis not present

## 2021-02-14 DIAGNOSIS — L97512 Non-pressure chronic ulcer of other part of right foot with fat layer exposed: Secondary | ICD-10-CM | POA: Diagnosis not present

## 2021-02-14 DIAGNOSIS — G5602 Carpal tunnel syndrome, left upper limb: Secondary | ICD-10-CM | POA: Diagnosis not present

## 2021-02-14 DIAGNOSIS — M48062 Spinal stenosis, lumbar region with neurogenic claudication: Secondary | ICD-10-CM | POA: Diagnosis not present

## 2021-02-14 DIAGNOSIS — L97518 Non-pressure chronic ulcer of other part of right foot with other specified severity: Secondary | ICD-10-CM | POA: Diagnosis not present

## 2021-02-15 NOTE — Progress Notes (Signed)
[name], [name] ([id number]) Visit Report for 02/14/2021 Arrival Information Details Patient Name: [name]: [name], [name] [name] 02/14/2021 9:15 A M Medical Record Number: [id number] Patient Account Number: [id number] Date of Birth/Sex: Treating RN: [birth year] (80 y.o. [name], [name] Primary Care Provider: Leitha Hanson Other Clinician: Referring Provider: Treating Provider/Extender: [name] Weeks in Treatment: 1 Visit Information History Since Last Visit Added or deleted any medications: No Patient Arrived: Ambulatory Any new allergies or adverse reactions: No Arrival Time: 09:19 Had a fall or experienced change in No Accompanied By: self activities of daily living that may affect Transfer Assistance: None risk of falls: Patient Identification Verified: Yes Signs or symptoms of abuse/neglect since last visito No Secondary Verification Process Completed: Yes Hospitalized since last visit: No Patient Requires Transmission-Based Precautions: No Implantable device outside of the clinic excluding No Patient Has Alerts: Yes cellular tissue based products placed in the center Patient Alerts: R ABI non compressible since last visit: Has Dressing in Place as Prescribed: Yes Pain Present Now: No Electronic Signature(s) Signed: 02/14/2021 4:58:23 PM By: [name] Entered By: [name] on 02/14/2021 09:19:33 -------------------------------------------------------------------------------- Clinic Level of Care Assessment Details Patient Name: [name]: [name], [name] [name] 02/14/2021 9:15 A M Medical Record Number: [id number] Patient Account Number: [id number] Date of Birth/Sex: Treating RN: December 21, 1940 (80 y.o. [name], [name] Primary Care Provider: Leitha Hanson Other Clinician: Referring Provider: Treating Provider/Extender: [name] Weeks in Treatment: 1 Clinic Level of Care Assessment Items TOOL 4  Quantity Score X- 1 0 Use when only an EandM is performed on FOLLOW-UP visit ASSESSMENTS - Nursing Assessment / Reassessment X- 1 10 Reassessment of Co-morbidities (includes updates in patient status) X- 1 5 Reassessment of Adherence to Treatment Plan ASSESSMENTS - Wound and Skin A ssessment / Reassessment X - Simple Wound Assessment / Reassessment - one wound 1 5 [] - 0 Complex Wound Assessment / Reassessment - multiple wounds X- 1 10 Dermatologic / Skin Assessment (not related to wound area) ASSESSMENTS - Focused Assessment X- 1 5 Circumferential Edema Measurements - multi extremities X- 1 10 Nutritional Assessment / Counseling / Intervention [] - 0 Lower Extremity Assessment (monofilament, tuning fork, pulses) [] - 0 Peripheral Arterial Disease Assessment (using hand held doppler) ASSESSMENTS - Ostomy and/or Continence Assessment and Care [] - 0 Incontinence Assessment and Management [] - 0 Ostomy Care Assessment and Management (repouching, etc.) PROCESS - Coordination of Care X - Simple Patient / Family Education for ongoing care 1 15 [] - 0 Complex (extensive) Patient / Family Education for ongoing care X- 1 10 Staff obtains Programmer, systems, Records, T Results / Process Orders est [] - 0 Staff telephones HHA, Nursing Homes / Clarify orders / etc [] - 0 Routine Transfer to another Facility (non-emergent condition) [] - 0 Routine Hospital Admission (non-emergent condition) [] - 0 New Admissions / Biomedical engineer / Ordering NPWT Apligraf, etc. , [] - 0 Emergency Hospital Admission (emergent condition) X- 1 10 Simple Discharge Coordination [] - 0 Complex (extensive) Discharge Coordination PROCESS - Special Needs [] - 0 Pediatric / Minor Patient Management [] - 0 Isolation Patient Management [] - 0 Hearing / Language / Visual special needs [] - 0 Assessment of Community assistance (transportation, D/C planning, etc.) [] - 0 Additional assistance / Altered  mentation [] - 0 Support Surface(s) Assessment (bed, cushion, seat, etc.) INTERVENTIONS - Wound Cleansing / Measurement X - Simple Wound Cleansing - one wound 1  5 [] - 0 Complex Wound Cleansing - multiple wounds X- 1 5 Wound Imaging (photographs - any number of wounds) [] - 0 Wound Tracing (instead of photographs) X- 1 5 Simple Wound Measurement - one wound [] - 0 Complex Wound Measurement - multiple wounds INTERVENTIONS - Wound Dressings X - Small Wound Dressing one or multiple wounds 1 10 [] - 0 Medium Wound Dressing one or multiple wounds [] - 0 Large Wound Dressing one or multiple wounds [] - 0 Application of Medications - topical [] - 0 Application of Medications - injection INTERVENTIONS - Miscellaneous [] - 0 External ear exam [] - 0 Specimen Collection (cultures, biopsies, blood, body fluids, etc.) [] - 0 Specimen(s) / Culture(s) sent or taken to Lab for analysis [] - 0 Patient Transfer (multiple staff / Civil Service fast streamer / Similar devices) [] - 0 Simple Staple / Suture removal (25 or less) [] - 0 Complex Staple / Suture removal (26 or more) [] - 0 Hypo / Hyperglycemic Management (close monitor of Blood Glucose) [] - 0 Ankle / Brachial Index (ABI) - do not check if billed separately X- 1 5 Vital Signs Has the patient been seen at the hospital within the last three years: Yes Total Score: 110 Level Of Care: New/Established - Level 3 Electronic Signature(s) Signed: 02/15/2021 4:53:51 PM By: [name] Entered By: [name] on 02/14/2021 10:11:54 -------------------------------------------------------------------------------- Encounter Discharge Information Details Patient Name: [name]: [name] Banker [name] 02/14/2021 9:15 A M Medical Record Number: [id number] Patient Account Number: [id number] Date of Birth/Sex: Treating RN: Jan 25, 1941 (80 y.o. [name] Primary Care [name]: [name] Other Clinician: Referring [name]: Treating  [name]/Extender: [name] Weeks in Treatment: 1 Encounter Discharge Information Items Discharge Condition: Stable Ambulatory Status: Ambulatory Discharge Destination: Home Transportation: Private Auto Accompanied By: self Schedule Follow-up Appointment: Yes Clinical Summary of Care: Electronic Signature(s) Signed: 02/15/2021 4:53:51 PM By: [name] Entered By: [name] on 02/14/2021 11:11:05 -------------------------------------------------------------------------------- Lower Extremity Assessment Details Patient Name: [name]: [name], [name] [name] 02/14/2021 9:15 A M Medical Record Number: [id number] Patient Account Number: [id number] Date of Birth/Sex: Treating RN: 1941-06-16 (80 y.o. [name] Primary Care Jionni Helming: [name] Other Clinician: Referring Naraly Fritcher: Treating Rikki Trosper/Extender: [name] Weeks in Treatment: 1 Edema Assessment Assessed: [name]: No] [name]: No] Edema: [Left: Ye] [Right: s] Calf Left: Right: Point of Measurement: 33 cm From Medial Instep 38 cm Ankle Left: Right: Point of Measurement: 10 cm From Medial Instep 24.6 cm Vascular Assessment Pulses: Dorsalis Pedis Palpable: [Right:Yes] Electronic Signature(s) Signed: 02/14/2021 5:35:17 PM By: Lorrin Jackson Entered By: Lorrin Jackson on 02/14/2021 09:27:43 -------------------------------------------------------------------------------- Multi Wound Chart Details Patient Name: [name]: [name] Banker [name] 02/14/2021 9:15 A M Medical Record Number: [id number] Patient Account Number: [id number] Date of Birth/Sex: Treating RN: January 23, 1941 (80 y.o. 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] [N/A:N/A] Primary Etiology: [1:Cataracts, Chronic sinus] [N/A:N/A] Comorbid History: [1:problems/congestion, Asthma, Sleep Apnea, Hypertension, Peripheral Venous Disease, Osteoarthritis 01/10/2020] [N/A:N/A] Date Acquired: [1:1] [N/A:N/A] Weeks of Treatment: [1:Open] [N/A:N/A] Wound Status: [1:0.7x1x0.2] [N/A:N/A] Measurements L x W x D (cm) [1:0.55] [N/A:N/A] A (cm) : rea [  1:0.11] [N/A:N/A] Volume (cm) : [1:41.60%] [N/A:N/A] % Reduction in Area: [1:41.50%] [N/A:N/A] % Reduction in Volume: [1:Full Thickness Without Exposed] [N/A:N/A] Classification: [1:Support Structures Medium] [N/A:N/A] Exudate Amount: [1:Serosanguineous] [N/A:N/A] Exudate Type: [1:red, brown] [N/A:N/A] Exudate Color: [1:Distinct, outline attached] [N/A:N/A] Wound Margin: [1:Medium (34-66%)] [N/A:N/A] Granulation Amount: [1:Red] [N/A:N/A] Granulation Quality: [1:Small (1-33%)] [N/A:N/A] Necrotic Amount: [1:Fat Layer (Subcutaneous Tissue): Yes N/A] Exposed Structures: [1:Fascia: No Tendon: No Muscle: No Joint: No Bone: No Small (1-33%)] [N/A:N/A] Treatment Notes Electronic Signature(s) Signed: 02/14/2021 5:22:04 PM By: Linton Ham MD Signed: 02/15/2021 4:53:51 PM By: [name] Entered By: Linton Ham on 02/14/2021 10:57:50 -------------------------------------------------------------------------------- Multi-Disciplinary Care Plan Details Patient Name: [name]: [name] Banker [name] 02/14/2021 9:15 A M Medical Record Number: [id number] Patient Account Number: [id number] Date of Birth/Sex: Treating RN: 07/30/1941 (80 y.o. [name], [name] Primary Care Provider: Leitha Hanson Other Clinician: Referring Provider: Treating Provider/Extender: [name] Weeks in  Treatment: 1 Active Inactive Necrotic Tissue Nursing Diagnoses: Impaired tissue integrity related to necrotic/devitalized tissue Knowledge deficit related to management of necrotic/devitalized tissue Goals: Necrotic/devitalized tissue will be minimized in the wound bed Date Initiated: 02/07/2021 Target Resolution Date: 04/24/2021 Goal Status: Active Patient/caregiver will verbalize understanding of reason and process for debridement of necrotic tissue Date Initiated: 02/07/2021 Target Resolution Date: 02/15/2021 Goal Status: Active Interventions: Assess patient pain level pre-, during and post procedure and prior to discharge Provide education on necrotic tissue and debridement process Treatment Activities: T ordered outside of clinic : 02/07/2021 est Notes: Pain, Acute or Chronic Nursing Diagnoses: Pain, acute or chronic: actual or potential Potential alteration in comfort, pain Goals: Patient will verbalize adequate pain control and receive pain control interventions during procedures as needed Date Initiated: 02/07/2021 Target Resolution Date: 02/15/2021 Goal Status: Active Patient/caregiver will verbalize comfort level met Date Initiated: 02/07/2021 Target Resolution Date: 02/15/2021 Goal Status: Active Interventions: Complete pain assessment as per visit requirements Provide education on pain management Reposition patient for comfort Treatment Activities: Administer pain control measures as ordered : 02/07/2021 Notes: Wound/Skin Impairment Nursing Diagnoses: Knowledge deficit related to ulceration/compromised skin integrity Goals: Patient/caregiver will verbalize understanding of skin care regimen Date Initiated: 02/07/2021 Target Resolution Date: 02/15/2021 Goal Status: Active Interventions: Assess patient/caregiver ability to obtain necessary supplies Assess patient/caregiver ability to perform ulcer/skin care regimen upon admission and as needed Provide education on ulcer  and skin care Treatment Activities: Skin care regimen initiated : 02/07/2021 Topical wound management initiated : 02/07/2021 Notes: Electronic Signature(s) Signed: 02/15/2021 4:53:51 PM By: [name] Entered By: [name] on 02/14/2021 09:22:16 -------------------------------------------------------------------------------- Pain Assessment Details Patient Name: [name]: [name] Banker [name] 02/14/2021 9:15 A M Medical Record Number: [id number] Patient Account Number: [id number] Date of Birth/Sex: Treating RN: 1941/04/28 (80 y.o. [name] Primary Care Provider: Leitha Hanson Other Clinician: Referring Provider: Treating Provider/Extender: [name] Weeks in Treatment: 1 Active Problems Location of Pain Severity and Description of Pain Patient Has Paino No Site Locations Pain Management and Medication Current Pain Management: Electronic Signature(s) Signed: 02/14/2021 4:58:23 PM By: [name] Signed: 02/15/2021 4:53:51 PM By: [name] Entered By: [name] on 02/14/2021 09:21:01 -------------------------------------------------------------------------------- Patient/Caregiver Education Details Patient Name: [name]: [name] Banker [name] 4/7/2022andnbsp9:15 A M Medical Record Number: [id number] Patient Account Number: [id number] Date of Birth/Gender: Treating RN: October 21, 1941 (80 y.o. [name] Primary Care Physician: [name] Other Clinician: Referring Physician: Treating Physician/Extender: [name] Weeks in Treatment: 1 Education Assessment  Education Provided To: Patient Education Topics Provided Pain: Handouts: A Guide to Pain Control Methods: Explain/Verbal Responses: Reinforcements needed Electronic Signature(s) Signed: 02/15/2021 4:53:51 PM By: [name] Entered By: [name] on 02/14/2021  09:22:27 -------------------------------------------------------------------------------- Wound Assessment Details Patient Name: [name]: [name] Banker [name] 02/14/2021 9:15 A M Medical Record Number: [id number] Patient Account Number: [id number] Date of Birth/Sex: Treating RN: 04-13-1941 (80 y.o. 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 Great oe Wound Open Wounding Event: Other Lesion Status: Date Acquired: 01/10/2020 Comorbid Cataracts, Chronic sinus problems/congestion, Asthma, Sleep Weeks Of Treatment: 1 History: Apnea, Hypertension, Peripheral Venous Disease, Osteoarthritis Clustered Wound: No Photos Wound Measurements Length: (cm) 0.7 Width: (cm) 1 Depth: (cm) 0.2 Area: (cm) 0.55 Volume: (cm) 0.11 % Reduction in Area: 41.6% % Reduction in Volume: 41.5% Epithelialization: Small (1-33%) Tunneling: No Undermining: No Wound Description Classification: Full Thickness Without Exposed Support Structures Wound Margin: Distinct, outline attached Exudate Amount: Medium Exudate Type: Serosanguineous Exudate Color: red, brown Foul Odor After Cleansing: No Slough/Fibrino Yes Wound Bed Granulation Amount: Medium (34-66%) Exposed Structure Granulation Quality: Red Fascia Exposed: No Necrotic Amount: Small (1-33%) Fat Layer (Subcutaneous Tissue) Exposed: Yes Necrotic Quality: Adherent Slough Tendon Exposed: No Muscle Exposed: No Joint Exposed: No Bone Exposed: No Treatment Notes Wound #1 (Toe Great) Wound Laterality: Right Cleanser Wound Cleanser Discharge Instruction: Cleanse the wound with wound cleanser prior to applying a clean dressing using gauze sponges, not tissue or cotton balls. Peri-Wound Care Topical Primary  Dressing Promogran Prisma Matrix, 4.34 (sq in) (silver collagen) Discharge Instruction: Moisten collagen with saline or hydrogel Secondary Dressing Woven Gauze Sponges 2x2 in Discharge Instruction: Apply over primary dressing as directed. Secured With Conforming Stretch Gauze Bandage, Sterile 2x75 (in/in) Discharge Instruction: Secure with stretch gauze as directed. Compression Wrap Compression Stockings Add-Ons Notes patient refused surgical shoe related to driving and then unlevel to other shoe causing back pain. MD aware. Electronic Signature(s) Signed: 02/14/2021 4:58:23 PM By: [name] Signed: 02/15/2021 4:53:51 PM By: [name] Entered By: [name] on 02/14/2021 16:36:02 -------------------------------------------------------------------------------- Vitals Details Patient Name: [name]: [name] Banker [name] 02/14/2021 9:15 A M Medical Record Number: [id number] Patient Account Number: [id number] Date of Birth/Sex: Treating RN: 01/21/1941 (80 y.o. [name] Primary Care Provider: Leitha Hanson Other Clinician: Referring Provider: Treating Provider/Extender: [name] Weeks in Treatment: 1 Vital Signs Time Taken: 09:19 Temperature (F): 98.3 Pulse (bpm): 72 Respiratory Rate (breaths/min): 20 Blood Pressure (mmHg): 168/72 Reference Range: 80 - 120 mg / dl Electronic Signature(s) Signed: 02/14/2021 4:58:23 PM By: [name] Entered By: [name] on 02/14/2021 09:20:57

## 2021-02-15 NOTE — Progress Notes (Signed)
[name], Hanson ([id number]) Visit Report for 02/14/2021 HPI Details Patient Name: [name]: [name], [name] [name] 02/14/2021 9:15 A M Medical Record Number: [id number] Patient Account Number: [id number] Date of Birth/Sex: Treating RN: May 18, 1941 (80 y.o. [name] Primary Care Provider: [name] Other Clinician: Referring Provider: Treating Provider/Extender: [name] Weeks in Treatment: 1 History of Present Illness HPI Description: ADMISSION 02/07/2021 This is a 80 year old man who is not a diabetic "prediabetic" who comes in today letter asking [place] to look at an area on his right first toe tip that has been there about a year. He has been getting this dressed that [employer] and he had Xeroform on this with gauze. The patient is not exactly sure how this came about but he is not able to dress the wound himself because of limitations due to what sounds like spinal stenosis and pain. He had a wound on the ankle as well but that healed over. He was seen by vascular in May 2021. This was related to a right great toe wound. He had an angiogram that was completely normal on both sides good perfusion right into the feet. He also looks like he has lymphedema and he has compression stockings but he cannot get them on and off so he essentially leaves he is on even when he showering Past medical history includes prediabetes, low back pain secondary I think the lumbar spinal stenosis has been offered a decompressive laminectomy but he has not gone forth with this. 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 ABI was noncompressible in our clinic on the right right first toe. 4/7; patient I admitted the clinic last week. He has an area on the tip of his right first toe. We cleaned this off last week and have been using silver collagen. He is in the same crocs today that he wore last week and I told him not  to use. For 1 reason or another he could not handle a surgical sandal. 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 02/14/2021 9:15 A M Medical Record Number: [id number] Patient Account Number: [id number] Date of Birth/Sex: Treating RN: 1940/12/10 (80 y.o. [name] Primary Care Provider: [name] Other Clinician: Referring Provider: Treating Provider/Extender: [name] Weeks in Treatment: 1 Constitutional Patient is hypertensive.. Pulse regular and within target range for patient.[name] Respirations regular, non-labored and within target range.. Temperature is normal and within the target range for the patient.[name] Appears in no distress. Musculoskeletal There is pain over the Achilles part of his right heel although I cannot localize this. He has minimal range in ankle dorsi and plantar flexion. Notes Wound exam; the area of the tip of her right great toe. Surface looks a lot better today. I am not sure about the actual surface area. No debridement is required. Wound bed still looks a little dry but not infected. Electronic Signature(s) Signed: 02/14/2021 5:22:04 PM By: Linton Ham MD Signed: 02/14/2021 5:22:04 PM By: Linton Ham MD Entered By: Linton Ham on 02/14/2021 11:00:57 -------------------------------------------------------------------------------- Physician Orders Details Patient Name: [name]: [name] [name] 02/14/2021 9:15 A M Medical Record Number: [id number] Patient Account  Number: [id number] Date of Birth/Sex: Treating RN: [birth year] (80 y.o. 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 - Thursday Return A Nurse Visit: - Monday or Tuesday Bathing/ Shower/ Hygiene May shower with protection but do not get wound dressing(s) wet. - use a cast protector. Edema Control - Lymphedema / SCD / Other Elevate legs to the level of the heart or above for 30 minutes daily and/or when sitting, a frequency of: - throughout the day. Avoid standing for long periods of time. Exercise regularly Moisturize legs daily. - both legs every night before bed. Off-Loading Open toe surgical shoe to: - wound center clinic to provide surgical shoe to patient to wear while walking and standing to right foot. Other: - Stop wearing the Croc slip on shoes. 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 Ensure no pressure of on the tip right great toe. Wound Treatment Wound #1 - T Great oe Wound Laterality: Right Cleanser: Wound Cleanser 2 x Per Week Discharge Instructions: Cleanse the wound with wound cleanser prior to applying a clean dressing using gauze sponges, not tissue or cotton balls. Prim Dressing: Promogran Prisma Matrix, 4.34 (sq in) (silver collagen) 2 x Per Week ary Discharge Instructions: Moisten collagen with saline or hydrogel Secondary Dressing: Woven Gauze Sponges 2x2 in 2 x Per Week Discharge Instructions: Apply over primary dressing as directed. Secured With: Child psychotherapist, Sterile 2x75 (in/in) 2 x Per Week Discharge Instructions: Secure with stretch gauze as directed. Electronic Signature(s) Signed: 02/14/2021 5:22:04 PM By: Linton Ham MD Signed:  02/15/2021 4:53:51 PM By: Deon Pilling Entered By: Deon Pilling on 02/14/2021 10:10:47 -------------------------------------------------------------------------------- Problem List Details Patient Name: [name]: [name] [name] 02/14/2021 9:15 A M Medical Record Number: [id number] Patient Account Number: [id number] Date of Birth/Sex: Treating RN: [birth year] (80 y.o. 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 02/14/2021 9:15 A M Medical Record Number: [id number] Patient Account Number: [id number] Date of Birth/Sex: Treating RN: [birth year] (80 y.o. [name] Primary Care Provider: [name] Other Clinician: Referring Provider: Treating Provider/Extender: [name] Weeks in Treatment: 1 Subjective History of Present Illness (HPI) ADMISSION 02/07/2021 This is a 80 year old man who is not a diabetic "prediabetic" who comes in today letter asking [place] to look at an area on his right first toe tip that has been there about a year. He has been getting this dressed that [employer] and he had Xeroform on this with gauze. The patient is not exactly sure how this came about but he is not able to dress the wound himself because of limitations due  to what sounds like spinal stenosis and  pain. He had a wound on the ankle as well but that healed over. He was seen by vascular in May 2021. This was related to a right great toe wound. He had an angiogram that was completely normal on both sides good perfusion right into the feet. He also looks like he has lymphedema and he has compression stockings but he cannot get them on and off so he essentially leaves he is on even when he showering Past medical history includes prediabetes, low back pain secondary I think the lumbar spinal stenosis has been offered a decompressive laminectomy but he has not gone forth with this. 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 ABI was noncompressible in our clinic on the right right first toe. 4/7; patient I admitted the clinic last week. He has an area on the tip of his right first toe. We cleaned this off last week and have been using silver collagen. He is in the same crocs today that he wore last week and I told him not to use. For 1 reason or another he could not handle a surgical sandal. 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 He is not a diabetic Objective Constitutional Patient is hypertensive.. Pulse regular and within target range for patient.[name] Respirations regular, non-labored and within target range.. Temperature is normal and within the target range for the patient.[name] Appears in no distress. Vitals Time Taken: 9:19 AM, Temperature: 98.3 F, Pulse: 72 bpm, Respiratory Rate: 20 breaths/min, Blood Pressure: 168/72 mmHg. Musculoskeletal There is pain over the Achilles part of his right heel although I cannot localize this. He has minimal range in ankle dorsi and plantar flexion. General Notes: Wound exam; the area of the tip of her right great toe. Surface looks a lot better today. I am not sure about the actual surface area. No debridement is required. Wound bed  still looks a little dry but not infected. Integumentary (Hair, Skin) Wound #1 status is Open. Original cause of wound was Other Lesion. The date acquired was: 01/10/2020. The wound has been in treatment 1 weeks. The wound is located on the Right T Great. The wound measures 0.7cm length x 1cm width x 0.2cm depth; 0.55cm^2 area and 0.11cm^3 volume. There is Fat Layer oe (Subcutaneous Tissue) exposed. There is no tunneling or undermining noted. There is a medium amount of serosanguineous drainage noted. The wound margin is distinct with the outline attached to the wound base. There is medium (34-66%) red granulation within the wound bed. There is a small (1-33%) amount of necrotic tissue within the wound bed including Adherent Slough. 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 - Thursday Nurse Visit: - Monday or Tuesday Bathing/ Shower/ Hygiene: May shower with protection but do not get wound dressing(s) wet. - use a cast protector. Edema Control - Lymphedema / SCD / Other: Elevate legs to the level of the heart or above for 30 minutes daily and/or when sitting, a frequency of: - throughout the day. Avoid standing for long periods of time. Exercise regularly Moisturize legs daily. - both legs every night before bed. Off-Loading: Open toe surgical shoe to: - wound center clinic to provide surgical shoe to patient to wear while walking and standing to right foot. Other: - Stop wearing  the Croc slip on shoes. 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 Ensure no pressure of on the tip right great toe. WOUND #1: - T Great Wound Laterality: Right oe Cleanser: Wound Cleanser 2 x Per Week/ Discharge Instructions: Cleanse the wound with wound cleanser prior to applying a clean dressing using gauze  sponges, not tissue or cotton balls. Prim Dressing: Promogran Prisma Matrix, 4.34 (sq in) (silver collagen) 2 x Per Week/ ary Discharge Instructions: Moisten collagen with saline or hydrogel Secondary Dressing: Woven Gauze Sponges 2x2 in 2 x Per Week/ Discharge Instructions: Apply over primary dressing as directed. Secured With: Child psychotherapist, Sterile 2x75 (in/in) 2 x Per Week/ Discharge Instructions: Secure with stretch gauze as directed. #1 thankfully his wound actually looks some better. Some improvement in the condition of the wound and some improvement in the surface area. No debridement was necessary 2. He does not have anybody to help him change dressings and he cannot seem to do this himself so we are bringing him in for nurse visits to change the wound dressing. 3. He cannot seem to manage his surgical sandal because of driving issues and balance etc. 4. I have asked him to find a different pair of shoes other than crocs as they do not seem to help people with foot wounds 5. I have advised him to follow-up with orthopedics perhaps Achilles tendinitis or Achilles tendon bursitis. He is going to [facility] which is satisfactory Electronic Signature(s) Signed: 02/14/2021 5:22:04 PM By: Linton Ham MD Entered By: Linton Ham on 02/14/2021 11:02:46 -------------------------------------------------------------------------------- SuperBill Details Patient Name: [name]: [name] [name] 02/14/2021 Medical Record Number: [id number] Patient Account Number: [id number] Date of Birth/Sex: Treating RN: 02-05-1941 (80 y.o. [name] Primary Care Provider: [name] Other Clinician: Referring Provider: Treating Provider/Extender: [name] Weeks in Treatment: 1 Diagnosis Coding ICD-10 Codes Code Description [phone] Non-pressure chronic ulcer of other part of right foot with other specified severity M48.062  Spinal stenosis, lumbar region with neurogenic claudication Facility Procedures CPT4 Code: [id number] Description: 99213 - WOUND CARE VISIT-LEV 3 EST PT Modifier: Quantity: 1 Physician Procedures : CPT4 Code Description Modifier [id number] - WC PHYS LEVEL 3 - EST PT ICD-10 Diagnosis Description L97.518 Non-pressure chronic ulcer of other part of right foot with other specified severity M48.062 Spinal stenosis, lumbar region with neurogenic  claudication Quantity: 1 Electronic Signature(s) Signed: 02/14/2021 5:22:04 PM By: Linton Ham MD Entered By: Linton Ham on 02/14/2021 11:03:01

## 2021-02-19 ENCOUNTER — Other Ambulatory Visit: Payer: Self-pay

## 2021-02-19 ENCOUNTER — Encounter (HOSPITAL_BASED_OUTPATIENT_CLINIC_OR_DEPARTMENT_OTHER): Payer: Medicare Other | Admitting: Internal Medicine

## 2021-02-19 DIAGNOSIS — G629 Polyneuropathy, unspecified: Secondary | ICD-10-CM | POA: Diagnosis not present

## 2021-02-19 DIAGNOSIS — L97518 Non-pressure chronic ulcer of other part of right foot with other specified severity: Secondary | ICD-10-CM | POA: Diagnosis not present

## 2021-02-19 DIAGNOSIS — L97512 Non-pressure chronic ulcer of other part of right foot with fat layer exposed: Secondary | ICD-10-CM | POA: Diagnosis not present

## 2021-02-19 DIAGNOSIS — M48062 Spinal stenosis, lumbar region with neurogenic claudication: Secondary | ICD-10-CM | POA: Diagnosis not present

## 2021-02-21 ENCOUNTER — Encounter (HOSPITAL_BASED_OUTPATIENT_CLINIC_OR_DEPARTMENT_OTHER): Payer: Medicare Other | Admitting: Internal Medicine

## 2021-02-21 NOTE — Progress Notes (Signed)
DOSS, CYBULSKI ([id number]) Visit Report for 02/19/2021 Arrival Information Details Patient Name: [name]: [name], [name] [name] 02/19/2021 10:30 A M Medical Record Number: [id number] Patient Account Number: [id number] Date of Birth/Sex: Treating RN: Sep 26, 1941 (80 y.o. [name], [name] Primary Care Luke Rigsbee: [name] Other Clinician: Referring Seraphine Gudiel: Treating Ranard Harte/Extender: [name] Weeks in Treatment: 1 Visit Information History Since Last Visit Added or deleted any medications: No Patient Arrived: Ambulatory Any new allergies or adverse reactions: No Arrival Time: 11:26 Had a fall or experienced change in No Accompanied By: self activities of daily living that may affect Transfer Assistance: None risk of falls: Patient Identification Verified: Yes Signs or symptoms of abuse/neglect since last visito No Secondary Verification Process Completed: Yes Hospitalized since last visit: No Patient Requires Transmission-Based Precautions: No Implantable device outside of the clinic excluding No Patient Has Alerts: Yes cellular tissue based products placed in the center Patient Alerts: R ABI non compressible since last visit: Has Dressing in Place as Prescribed: Yes Pain Present Now: No Electronic Signature(s) Signed: 02/20/2021 8:29:36 AM By: Sandre Kitty Entered By: Sandre Kitty on 02/19/2021 11:26:43 -------------------------------------------------------------------------------- Encounter Discharge Information Details Patient Name: [name]: [name] Banker [name] 02/19/2021 10:30 A M Medical Record Number: [id number] Patient Account Number: [id number] Date of Birth/Sex: Treating RN: 06-13-1941 (80 y.o. [name] Primary Care Surya Folden: [name] Other Clinician: Referring Lyndell Gillyard: Treating Tamala Manzer/Extender: [name] Weeks in Treatment: 1 Encounter Discharge Information Items Post  Procedure Vitals Discharge Condition: Stable Temperature (F): 98.3 Ambulatory Status: Ambulatory Pulse (bpm): 97 Discharge Destination: Home Respiratory Rate (breaths/min): 20 Transportation: Private Auto Blood Pressure (mmHg): 152/83 Schedule Follow-up Appointment: Yes Clinical Summary of Care: Provided on 02/19/2021 Form Type Recipient Paper Patient Patient Electronic Signature(s) Signed: 02/19/2021 12:05:02 PM By: Lorrin Jackson Entered By: Lorrin Jackson on 02/19/2021 12:05:02 -------------------------------------------------------------------------------- Lower Extremity Assessment Details Patient Name: [name]: [name] Banker [name] 02/19/2021 10:30 A M Medical Record Number: [id number] Patient Account Number: [id number] Date of Birth/Sex: Treating RN: [birth year] (80 y.o. [name] Primary Care Lataysha Vohra: [name] Other Clinician: Referring Hanan Mcwilliams: Treating Raynelle Fujikawa/Extender: [name] Weeks in Treatment: 1 Edema Assessment Assessed: [name]: No] [name]: Yes] Edema: [Left: Ye] [Right: s] Calf Left: Right: Point of Measurement: 33 cm From Medial Instep 37.4 cm Ankle Left: Right: Point of Measurement: 10 cm From Medial Instep 26 cm Vascular Assessment Pulses: Dorsalis Pedis Palpable: [Right:Yes] Electronic Signature(s) Signed: 02/19/2021 5:38:21 PM By: Lorrin Jackson Entered By: Lorrin Jackson on 02/19/2021 11:32:52 -------------------------------------------------------------------------------- Multi Wound Chart Details Patient Name: [name]: [name] Banker [name] 02/19/2021 10:30 A M Medical Record Number: [id number] Patient Account Number: [id number] Date of Birth/Sex: Treating RN: 10-15-1941 (80 y.o. 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] [N/A:N/A] Primary Etiology: [1:Cataracts, Chronic sinus] [N/A:N/A] Comorbid History: [1:problems/congestion, Asthma, Sleep Apnea, Hypertension, Peripheral Venous Disease, Osteoarthritis 01/10/2020] [N/A:N/A] Date Acquired: [1:1] [N/A:N/A] Weeks of Treatment: [1:Open] [N/A:N/A] Wound Status: [1:0.6x0.9x0.2] [N/A:N/A] Measurements L x W x D (cm) [1:0.424] [N/A:N/A] A (cm) : rea [1:0.085] [N/A:N/A] Volume (cm) : [1:55.00%] [N/A:N/A] % Reduction in Area: [1:54.80%] [N/A:N/A] % Reduction in Volume: [1:Full  Thickness Without Exposed] [N/A:N/A] Classification: [1:Support Structures Medium] [N/A:N/A] Exudate A mount: [1:Serosanguineous] [N/A:N/A] Exudate Type: [1:red, brown] [N/A:N/A] Exudate Color: [1:Distinct, outline attached] [N/A:N/A] Wound Margin: [1:Large (67-100%)] [N/A:N/A] Granulation A mount: [1:Red] [N/A:N/A] Granulation Quality: [1:Small (1-33%)] [N/A:N/A] Necrotic A mount: [1:Fat Layer (Subcutaneous Tissue): Yes N/A] Exposed Structures: [1:Fascia: No Tendon: No Muscle: No Joint: No Bone: No Small (1-33%)] [N/A:N/A] Epithelialization: [1:Debridement - Excisional] [N/A:N/A] Debridement: Pre-procedure Verification/Time Out 11:55 [N/A:N/A] Taken: [1:Lidocaine 4% T opical Solution] [N/A:N/A] Pain Control: [1:Subcutaneous, Slough] [N/A:N/A] Tissue Debrided: [1:Skin/Subcutaneous Tissue] [N/A:N/A] Level: [1:0.8] [N/A:N/A] Debridement A (sq cm): [1:rea Curette] [N/A:N/A] Instrument: [1:Moderate] [N/A:N/A] Bleeding: [1:Silver Nitrate] [N/A:N/A] Hemostasis A chieved: [1:0] [N/A:N/A] Procedural Pain: [1:0] [N/A:N/A] Post Procedural Pain: [1:Procedure was tolerated well]  [N/A:N/A] Debridement Treatment Response: [1:0.6x0.9x0.2] [N/A:N/A] Post Debridement Measurements L x W x D (cm) [1:0.085] [N/A:N/A] Post Debridement Volume: (cm) [1:Callused periwound, foot erythema] [N/A:N/A] Assessment Notes: [1:Debridement] [N/A:N/A] Treatment Notes Wound #1 (Toe Great) Wound Laterality: Right Cleanser Wound Cleanser Discharge Instruction: Cleanse the wound with wound cleanser prior to applying a clean dressing using gauze sponges, not tissue or cotton balls. Peri-Wound Care Topical Primary Dressing Promogran Prisma Matrix, 4.34 (sq in) (silver collagen) Discharge Instruction: Moisten collagen with saline or hydrogel Secondary Dressing Woven Gauze Sponges 2x2 in Discharge Instruction: Apply over primary dressing as directed. Secured With Conforming Stretch Gauze Bandage, Sterile 2x75 (in/in) Discharge Instruction: Secure with stretch gauze as directed. Compression Wrap Compression Stockings Add-Ons Electronic Signature(s) Signed: 02/19/2021 4:33:39 PM By: Linton Ham MD Signed: 02/21/2021 5:45:48 PM By: Rhae Hammock RN Entered By: Linton Ham on 02/19/2021 12:34:30 -------------------------------------------------------------------------------- Multi-Disciplinary Care Plan Details Patient Name: [name]: [name] Banker [name] 02/19/2021 10:30 A M Medical Record Number: [id number] Patient Account Number: [id number] Date of Birth/Sex: Treating RN: 02/01/1941 (80 y.o. [name], [name] Primary Care Ely Spragg: [name] Other Clinician: Referring Justyn Boyson: Treating Shruthi Northrup/Extender: [name] Weeks in Treatment: 1 Active Inactive Necrotic Tissue Nursing Diagnoses: Impaired tissue integrity related to necrotic/devitalized tissue Knowledge deficit related to management of necrotic/devitalized tissue Goals: Necrotic/devitalized tissue will be minimized in the wound bed Date Initiated: 02/07/2021 Target  Resolution Date: 04/24/2021 Goal Status: Active Patient/caregiver will verbalize understanding of reason and process for debridement of necrotic tissue Date Initiated: 02/07/2021 Target Resolution Date: 03/08/2021 Goal Status: Active Interventions: Assess patient pain level pre-, during and post procedure and prior to discharge Provide education on necrotic tissue and debridement process Treatment Activities: T ordered outside of clinic : 02/07/2021 est Notes: Pain, Acute or Chronic Nursing Diagnoses: Pain, acute or chronic: actual or potential Potential alteration in comfort, pain Goals: Patient will verbalize adequate pain control and receive pain control interventions during procedures as needed Date Initiated: 02/07/2021 Target Resolution Date: 03/08/2021 Goal Status: Active Patient/caregiver will verbalize comfort level met Date Initiated: 02/07/2021 Target Resolution Date: 03/08/2021 Goal Status: Active Interventions: Complete pain assessment as per visit requirements Provide education on pain management Reposition patient for comfort Treatment Activities: Administer pain control measures as ordered : 02/07/2021 Notes: Wound/Skin Impairment Nursing Diagnoses: Knowledge deficit related to ulceration/compromised skin integrity Goals: Patient/caregiver will verbalize understanding of skin care regimen Date Initiated: 02/07/2021 Target Resolution Date: 03/08/2021 Goal Status: Active Interventions: Assess patient/caregiver ability to obtain necessary supplies Assess patient/caregiver ability to perform ulcer/skin care regimen upon admission and as needed Provide education on ulcer and skin care Treatment Activities: Skin care regimen initiated : 02/07/2021 Topical wound management initiated : 02/07/2021 Notes: Electronic Signature(s) Signed: 02/19/2021 6:06:00 PM By: Deon Pilling Entered  By: Deon Pilling on 02/19/2021  11:51:49 -------------------------------------------------------------------------------- Pain Assessment Details Patient Name: [name]: [name], [name] [name] 02/19/2021 10:30 A M Medical Record Number: [id number] Patient Account Number: [id number] Date of Birth/Sex: Treating RN: 03/14/1941 (80 y.o. [name], [name] Primary Care Shella Lahman: [name] Other Clinician: Referring Tiffaney Heimann: Treating Reza Crymes/Extender: [name] Weeks in Treatment: 1 Active Problems Location of Pain Severity and Description of Pain Patient Has Paino No Site Locations Pain Management and Medication Current Pain Management: Electronic Signature(s) Signed: 02/20/2021 8:29:36 AM By: Sandre Kitty Signed: 02/21/2021 5:45:48 PM By: Rhae Hammock RN Entered By: Sandre Kitty on 02/19/2021 11:27:18 -------------------------------------------------------------------------------- Patient/Caregiver Education Details Patient Name: [name]: [name] Banker [name] 4/12/2022andnbsp10:30 A M Medical Record Number: [id number] Patient Account Number: [id number] Date of Birth/Gender: Treating RN: October 06, 1941 (80 y.o. [name] Primary Care Physician: [name] Other Clinician: Referring Physician: Treating Physician/Extender: Leonard Downing in Treatment: 1 Education Assessment Education Provided To: Patient Education Topics Provided Pain: Handouts: A Guide to Pain Control Methods: Explain/Verbal Responses: Reinforcements needed Electronic Signature(s) Signed: 02/19/2021 6:06:00 PM By: Deon Pilling Entered By: Deon Pilling on 02/19/2021 11:52:00 -------------------------------------------------------------------------------- Wound Assessment Details Patient Name: [name]: [name] Banker [name] 02/19/2021 10:30 A M Medical Record Number: [id number] Patient Account Number: [id number] Date of Birth/Sex: Treating  RN: [birth year] (80 y.o. 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 T Great oe Wound Open Wounding Event: Other Lesion Status: Date Acquired: 01/10/2020 Comorbid Cataracts, Chronic sinus problems/congestion, Asthma, Sleep Weeks Of Treatment: 1 History: Apnea, Hypertension, Peripheral Venous Disease, Osteoarthritis Clustered Wound: No Photos Wound Measurements Length: (cm) 0.6 Width: (cm) 0.9 Depth: (cm) 0.2 Area: (cm) 0.424 Volume: (cm) 0.085 % Reduction in Area: 55% % Reduction in Volume: 54.8% Epithelialization: Small (1-33%) Tunneling: No Undermining: No Wound Description Classification: Full Thickness Without Exposed Support Structures Wound Margin: Distinct, outline attached Exudate Amount: Medium Exudate Type: Serosanguineous Exudate Color: red, brown Foul Odor After Cleansing: No Slough/Fibrino Yes Wound Bed Granulation Amount: Large (67-100%) Exposed Structure Granulation Quality: Red Fascia Exposed: No Necrotic Amount: Small (1-33%) Fat Layer (Subcutaneous Tissue) Exposed: Yes Necrotic Quality: Adherent Slough Tendon Exposed: No Muscle Exposed: No Joint Exposed: No Bone Exposed: No Assessment Notes Callused periwound, foot erythema Treatment Notes Wound #1 (Toe Great) Wound Laterality: Right Cleanser Wound Cleanser Discharge Instruction: Cleanse the wound with wound cleanser prior to applying a clean dressing using gauze sponges, not tissue or cotton balls. Peri-Wound Care Topical Primary Dressing Promogran Prisma Matrix, 4.34 (sq in) (silver collagen) Discharge Instruction: Moisten collagen with saline or hydrogel Secondary Dressing Woven Gauze Sponges 2x2 in Discharge Instruction: Apply over primary dressing as  directed. Secured With Conforming Stretch Gauze Bandage, Sterile 2x75 (in/in) Discharge Instruction: Secure with stretch gauze as directed. Compression Wrap Compression Stockings Add-Ons Electronic Signature(s) Signed: 02/19/2021 5:38:21 PM By: Lorrin Jackson Signed: 02/20/2021 8:29:36 AM By: Sandre Kitty Entered By: Sandre Kitty on 02/19/2021 16:42:25 -------------------------------------------------------------------------------- Vitals Details Patient Name: [name]: [name] Banker [name] 02/19/2021 10:30 A M Medical Record Number: [id number] Patient Account Number: [id number] Date of Birth/Sex: Treating RN: 07/12/1941 (80 y.o. [name] Primary Care Glendora Clouatre: [name] Other Clinician: Referring Amel Gianino: Treating Jaxtin Raimondo/Extender: [name] Weeks in Treatment: 1 Vital Signs Time Taken: 11:26 Temperature (F):  98.3 Pulse (bpm): 97 Respiratory Rate (breaths/min): 20 Blood Pressure (mmHg): 152/83 Reference Range: 80 - 120 mg / dl Electronic Signature(s) Signed: 02/20/2021 8:29:36 AM By: Sandre Kitty Entered By: Sandre Kitty on 02/19/2021 11:27:06

## 2021-02-21 NOTE — Telephone Encounter (Signed)
Monitor was never returned and marked "lost" in the [employer] system. Order will be cancelled 

## 2021-02-21 NOTE — Progress Notes (Signed)
[name], [name] ([id number]) Visit Report for 02/19/2021 Debridement Details Patient Name: [name]: [name], BUELOW [name] 02/19/2021 10:30 A M Medical Record Number: [id number] Patient Account Number: [id number] Date of Birth/Sex: Treating RN: 03/11/1941 (80 y.o. [name], [name] Primary Care Provider: [name] Other Clinician: Referring Provider: Treating Provider/Extender: [name] Weeks in Treatment: 1 Debridement Performed for Assessment: Wound #1 Right T Great oe Performed By: Physician Ricard Dillon., MD Debridement Type: Debridement Level of Consciousness (Pre-procedure): Awake and Alert Pre-procedure Verification/Time Out Yes - 11:55 Taken: Start Time: 11:56 Pain Control: Lidocaine 4% T opical Solution T Area Debrided (L x W): otal 0.8 (cm) x 1 (cm) = 0.8 (cm) Tissue and other material debrided: Viable, Non-Viable, Slough, Subcutaneous, Skin: Dermis , Skin: Epidermis, Fibrin/Exudate, Slough Level: Skin/Subcutaneous Tissue Debridement Description: Excisional Instrument: Curette Bleeding: Moderate Hemostasis Achieved: Silver Nitrate End Time: 12:01 Procedural Pain: 0 Post Procedural Pain: 0 Response to Treatment: Procedure was tolerated well Level of Consciousness (Post- Awake and Alert procedure): Post Debridement Measurements of Total Wound Length: (cm) 0.6 Width: (cm) 0.9 Depth: (cm) 0.2 Volume: (cm) 0.085 Character of Wound/Ulcer Post Debridement: Improved Post Procedure Diagnosis Same as Pre-procedure Electronic Signature(s) Signed: 02/19/2021 4:33:39 PM By: Linton Ham MD Signed: 02/21/2021 5:45:48 PM By: Rhae Hammock RN Entered By: Linton Ham on 02/19/2021 12:34:47 -------------------------------------------------------------------------------- HPI Details Patient Name: [name]: [name] [name] 02/19/2021 10:30 A M Medical Record Number: [id number] Patient Account Number:  [id number] Date of Birth/Sex: Treating RN: [birth year] (80 y.o. [name], [name] Primary Care Provider: [name] Other Clinician: Referring Provider: Treating Provider/Extender: [name] Weeks in Treatment: 1 History of Present Illness HPI Description: ADMISSION 02/07/2021 This is a 80 year old man who is not a diabetic "prediabetic" who comes in today letter asking [place] to look at an area on his right first toe tip that has been there about a year. He has been getting this dressed that [employer] and he had Xeroform on this with gauze. The patient is not exactly sure how this came about but he is not able to dress the wound himself because of limitations due to what sounds like spinal stenosis and pain. He had a wound on the ankle as well but that healed over. He was seen by vascular in May 2021. This was related to a right great toe wound. He had an angiogram that was completely normal on both sides good perfusion right into the feet. He also looks like he has lymphedema and he has compression stockings but he cannot get them on and off so he essentially leaves he is on even when he showering Past medical history includes prediabetes, low back pain secondary I think the lumbar spinal stenosis has been offered a decompressive laminectomy but he has not gone forth with this. 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 ABI was noncompressible in our clinic on the right right first toe. 4/7; patient I admitted the clinic last week. He has an area on the tip of his right first toe. We cleaned this off last week and have been using silver collagen. He is in the same crocs today that he wore last week and I told him not to use. For 1 reason or another he could not handle a surgical sandal. 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 He is not a diabetic  4/12; right first toe wound  chronic. We have been using silver collagen. Once again he comes in then the same pair of old crocs that he comes in every week. 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 His x-ray suggested suspicious for osteomyelitis with bone irregularity at the tuft of the right great toe Electronic Signature(s) Signed: 02/19/2021 4:33:39 PM By: Linton Ham MD Entered By: Linton Ham on 02/19/2021 12:35:37 -------------------------------------------------------------------------------- Physical Exam Details Patient Name: [name]: [name] [name] 02/19/2021 10:30 A M Medical Record Number: [id number] Patient Account Number: [id number] Date of Birth/Sex: Treating RN: 06-23-1941 (80 y.o. [name] Primary Care Provider: [name] Other Clinician: Referring Provider: Treating Provider/Extender: [name] Weeks in Treatment: 1 Notes Wound exam; the area at the tip of the right great toe eschar around the wound I think in a dirty condition. Surface gritty nonviable. I used a #3 curette to clean this up. He has quite a bit of bleeding. Hemostasis with silver nitrate and a pressure dressing. This does not probe to bone. There is no evidence of surrounding infection Electronic Signature(s) Signed: 02/19/2021 4:33:39 PM By: Linton Ham MD Entered By: Linton Ham on 02/19/2021 12:37:57 -------------------------------------------------------------------------------- Physician Orders Details Patient Name: [name]: [name] [name] 02/19/2021 10:30 A M Medical Record Number: [id number] Patient Account Number: [id number] Date of Birth/Sex: Treating RN: 03-01-1941 (80 y.o. 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 - Thursday 02/28/2021 Return A Nurse Visit: - Friday 02/22/2021 Tuesday 02/26/2021 Bathing/ Shower/ Hygiene May shower with protection but do not get wound dressing(s) wet. - use a cast protector. Edema Control - Lymphedema / SCD / Other Elevate legs to the level of the heart or above for 30 minutes daily and/or when sitting, a frequency of: - throughout the day. Avoid standing for long periods of time. Exercise regularly Moisturize legs daily. - both legs every night before bed. Off-Loading Open toe surgical shoe to: - wound center clinic to provide surgical shoe to patient to wear while walking and standing to right foot. Other: - Stop wearing the Croc slip on shoes. 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 Ensure no pressure of on the tip right great toe. Wound Treatment Wound #1 - T Great oe Wound Laterality: Right Cleanser: Wound Cleanser 2 x Per Week Discharge Instructions: Cleanse the wound with wound cleanser prior to applying a clean dressing using gauze sponges, not tissue or cotton balls. Prim Dressing: Promogran Prisma Matrix, 4.34 (sq in) (silver collagen) 2 x Per Week ary Discharge Instructions: Moisten collagen with saline or hydrogel Secondary Dressing: Woven Gauze Sponges 2x2 in 2 x Per Week Discharge Instructions: Apply over primary dressing as directed. Secured With: Child psychotherapist, Sterile 2x75 (in/in) 2 x Per Week Discharge Instructions: Secure with stretch gauze as directed. Electronic Signature(s) Signed: 02/19/2021 4:33:39 PM By: Linton Ham MD Signed: 02/19/2021 6:06:00 PM By: Deon Pilling Entered By: Deon Pilling on 02/19/2021  12:02:25 -------------------------------------------------------------------------------- Problem List Details Patient Name: [name]: [name] [name] 02/19/2021 10:30 A M Medical Record Number:  [id number] Patient Account Number: [id number] Date of Birth/Sex: Treating RN: Jun 27, 1941 (80 y.o. 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 02/19/2021 10:30 A M Medical Record Number: [id number] Patient Account Number: [id number] Date of Birth/Sex: Treating RN: December 13, 1940 (80 y.o. [name], [name] Primary Care Provider: [name] Other Clinician: Referring Provider: Treating Provider/Extender: [name] Weeks in Treatment: 1 Subjective History of Present Illness (HPI) ADMISSION 02/07/2021 This is a 80 year old man who is not a diabetic "prediabetic" who comes in today letter asking [place] to look at an area on his right first toe tip that has been there about a year. He has been getting this dressed that [employer] and he had Xeroform on this with gauze. The patient is not exactly sure how this came about but he is not able to dress the wound himself because of limitations due to what sounds like spinal stenosis and pain. He had a wound on the  ankle as well but that healed over. He was seen by vascular in May 2021. This was related to a right great toe wound. He had an angiogram that was completely normal on both sides good perfusion right into the feet. He also looks like he has lymphedema and he has compression stockings but he cannot get them on and off so he essentially leaves he is on even when he showering Past medical history includes prediabetes, low back pain secondary I think the lumbar spinal stenosis has been offered a decompressive laminectomy but he has not gone forth with this. 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 ABI was noncompressible in our clinic on the right right first toe. 4/7; patient I admitted the clinic last week. He has an area on the tip of his right first toe. We cleaned this off last week and have been using silver collagen. He is in the same crocs today that he wore last week and I told him not to use. For 1 reason or another he could not handle a surgical sandal. 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 He is not a diabetic 4/12; right first toe wound chronic. We have been using silver collagen. Once again he comes in then the same pair of old crocs that he comes in every week. 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 His x-ray suggested suspicious for osteomyelitis with bone irregularity at the tuft of the right great toe Objective Constitutional Vitals Time Taken: 11:26 AM, Temperature: 98.3 F, Pulse: 97 bpm, Respiratory Rate: 20 breaths/min, Blood Pressure: 152/83 mmHg. Integumentary (Hair, Skin) Wound #1 status is Open. Original cause of wound was Other Lesion. The date acquired was: 01/10/2020. The wound has been in treatment 1 weeks. The wound is located on the Right T Great. The wound measures 0.6cm length x 0.9cm width x 0.2cm depth; 0.424cm^2  area and  0.085cm^3 volume. There is Fat Layer oe (Subcutaneous Tissue) exposed. There is no tunneling or undermining noted. There is a medium amount of serosanguineous drainage noted. The wound margin is distinct with the outline attached to the wound base. There is large (67-100%) red granulation within the wound bed. There is a small (1-33%) amount of necrotic tissue within the wound bed including Adherent Slough. 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 There was a Excisional Skin/Subcutaneous Tissue oe Debridement with a total area of 0.8 sq cm performed by Ricard Dillon., MD. With the following instrument(s): Curette to remove Viable and Non-Viable tissue/material. Material removed includes Subcutaneous Tissue, Slough, Skin: Dermis, Skin: Epidermis, and Fibrin/Exudate after achieving pain control using Lidocaine 4% T opical Solution. A time out was conducted at 11:55, prior to the start of the procedure. A Moderate amount of bleeding was controlled with Silver Nitrate. The procedure was tolerated well with a pain level of 0 throughout and a pain level of 0 following the procedure. Post Debridement Measurements: 0.6cm length x 0.9cm width x 0.2cm depth; 0.085cm^3 volume. Character of Wound/Ulcer Post Debridement is improved. Post procedure Diagnosis Wound #1: Same as Pre-Procedure Plan Follow-up Appointments: Return Appointment in 1 week. - Thursday 02/28/2021 Nurse Visit: - Friday 02/22/2021 Tuesday 02/26/2021 Bathing/ Shower/ Hygiene: May shower with protection but do not get wound dressing(s) wet. - use a cast protector. Edema Control - Lymphedema / SCD / Other: Elevate legs to the  level of the heart or above for 30 minutes daily and/or when sitting, a frequency of: - throughout the day. Avoid standing for long periods of time. Exercise regularly Moisturize legs daily. - both legs every night before bed. Off-Loading: Open toe surgical shoe to: - wound center clinic to provide surgical shoe to patient to wear while walking and standing to right foot. Other: - Stop wearing the Croc slip on shoes. 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 Ensure no pressure of on the tip right great toe. WOUND #1: - T Great Wound Laterality: Right oe Cleanser: Wound Cleanser 2 x Per Week/ Discharge Instructions: Cleanse the wound with wound cleanser prior to applying a clean dressing using gauze sponges, not tissue or cotton balls. Prim Dressing: Promogran Prisma Matrix, 4.34 (sq in) (silver collagen) 2 x Per Week/ ary Discharge Instructions: Moisten collagen with saline or hydrogel Secondary Dressing: Woven Gauze Sponges 2x2 in 2 x Per Week/ Discharge Instructions: Apply over primary dressing as directed. Secured With: Child psychotherapist, Sterile 2x75 (in/in) 2 x Per Week/ Discharge Instructions: Secure with stretch gauze as directed. 1. 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 2. This is slightly smaller but the surface of this no better. 3. I have continued to talk to him about foot wear. Crocs are not good for wounds since the foot slips all over the place and then including the tip of his right great toe. A neuropathic state he would not feel this. 4. I have reviewed the x-ray I do not see the evidence of cortical irregularityo Osteomyelitis although if this wound does not respond in the next 2 or 3 weeks then he is going to need an MRI. I talked to him about this today  5. I continue to think the bigger issue here is an adequate offloading of this area. I do not know what he is wearing at work although he says there is plenty of room to accommodate  his toe Electronic Signature(s) Signed: 02/19/2021 4:33:39 PM By: Linton Ham MD Entered By: Linton Ham on 02/19/2021 12:42:47 -------------------------------------------------------------------------------- SuperBill Details Patient Name: [name]: [name] [name] 02/19/2021 Medical Record Number: [id number] Patient Account Number: [id number] Date of Birth/Sex: Treating RN: 09/21/1941 (80 y.o. 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, lumbar region with neurogenic claudication Facility Procedures CPT4 Code: [id number] Description: 05397 - DEB SUBQ TISSUE 20 SQ CM/< ICD-10 Diagnosis Description L97.518 Non-pressure chronic ulcer of other part of right foot with other specified sev Modifier: erity Quantity: 1 Physician Procedures : CPT4 Code Description Modifier [id number] - WC PHYS SUBQ TISS 20 SQ CM ICD-10 Diagnosis Description L97.518 Non-pressure chronic ulcer of other part of right foot with other specified severity Quantity: 1 Electronic Signature(s) Signed: 02/19/2021 4:33:39 PM By: Linton Ham MD Entered By: Linton Ham on 02/19/2021 12:43:14

## 2021-02-22 ENCOUNTER — Encounter (HOSPITAL_BASED_OUTPATIENT_CLINIC_OR_DEPARTMENT_OTHER): Payer: Medicare Other | Admitting: Internal Medicine

## 2021-02-26 ENCOUNTER — Other Ambulatory Visit: Payer: Self-pay

## 2021-02-26 ENCOUNTER — Encounter (HOSPITAL_BASED_OUTPATIENT_CLINIC_OR_DEPARTMENT_OTHER): Payer: Medicare Other | Admitting: Internal Medicine

## 2021-02-26 DIAGNOSIS — M48062 Spinal stenosis, lumbar region with neurogenic claudication: Secondary | ICD-10-CM | POA: Diagnosis not present

## 2021-02-26 DIAGNOSIS — L97518 Non-pressure chronic ulcer of other part of right foot with other specified severity: Secondary | ICD-10-CM

## 2021-02-27 NOTE — Progress Notes (Signed)
TEGH, FRANEK ([id number]) Visit Report for 02/26/2021 Arrival Information Details Patient Name: [name]: [name], [name] [name] 02/26/2021 10:30 A M Medical Record Number: [id number] Patient Account Number: [id number] Date of Birth/Sex: Treating RN: Mar 19, 1941 (80 y.o. Ernestene Mention Primary Care Heinrich [name]: [name] Other Clinician: Referring Jishnu Jenniges: Treating Alysse Rathe/Extender: Alean Rinne Weeks in Treatment: 2 Visit Information History Since Last Visit Added or deleted any medications: No Patient Arrived: Ambulatory Any new allergies or adverse reactions: No Arrival Time: 11:02 Had a fall or experienced change in No Accompanied By: self activities of daily living that may affect Transfer Assistance: None risk of falls: Patient Identification Verified: Yes Signs or symptoms of abuse/neglect since last visito No Secondary Verification Process Completed: Yes Hospitalized since last visit: No Patient Requires Transmission-Based Precautions: No Implantable device outside of the clinic excluding No Patient Has Alerts: Yes cellular tissue based products placed in the center Patient Alerts: R ABI non compressible since last visit: Has Dressing in Place as Prescribed: Yes Pain Present Now: Yes Electronic Signature(s) Signed: 02/27/2021 6:20:01 PM By: Baruch Gouty RN, BSN Entered By: Baruch Gouty on 02/26/2021 11:03:22 -------------------------------------------------------------------------------- Clinic Level of Care Assessment Details Patient Name: [name]: [name] Banker [name] 02/26/2021 10:30 A M Medical Record Number: [id number] Patient Account Number: [id number] Date of Birth/Sex: Treating RN: Jul 04, 1941 (80 y.o. Ernestene Mention Primary Care Aissa Lisowski: [name] Other Clinician: Referring Avia Merkley: Treating Ardyn Forge/Extender: Alean Rinne Weeks in Treatment: 2 Clinic Level of Care Assessment  Items TOOL 4 Quantity Score []  - 0 Use when only an EandM is performed on FOLLOW-UP visit ASSESSMENTS - Nursing Assessment / Reassessment X- 1 10 Reassessment of Co-morbidities (includes updates in patient status) X- 1 5 Reassessment of Adherence to Treatment Plan ASSESSMENTS - Wound and Skin A ssessment / Reassessment X - Simple Wound Assessment / Reassessment - one wound 1 5 []  - 0 Complex Wound Assessment / Reassessment - multiple wounds []  - 0 Dermatologic / Skin Assessment (not related to wound area) ASSESSMENTS - Focused Assessment []  - 0 Circumferential Edema Measurements - multi extremities []  - 0 Nutritional Assessment / Counseling / Intervention []  - 0 Lower Extremity Assessment (monofilament, tuning fork, pulses) []  - 0 Peripheral Arterial Disease Assessment (using hand held doppler) ASSESSMENTS - Ostomy and/or Continence Assessment and Care []  - 0 Incontinence Assessment and Management []  - 0 Ostomy Care Assessment and Management (repouching, etc.) PROCESS - Coordination of Care X - Simple Patient / Family Education for ongoing care 1 15 []  - 0 Complex (extensive) Patient / Family Education for ongoing care X- 1 10 Staff obtains Programmer, systems, Records, T Results / Process Orders est []  - 0 Staff telephones HHA, Nursing Homes / Clarify orders / etc []  - 0 Routine Transfer to another Facility (non-emergent condition) []  - 0 Routine Hospital Admission (non-emergent condition) []  - 0 New Admissions / Biomedical engineer / Ordering NPWT Apligraf, etc. , []  - 0 Emergency Hospital Admission (emergent condition) X- 1 10 Simple Discharge Coordination []  - 0 Complex (extensive) Discharge Coordination PROCESS - Special Needs []  - 0 Pediatric / Minor Patient Management []  - 0 Isolation Patient Management []  - 0 Hearing / Language / Visual special needs []  - 0 Assessment of Community assistance (transportation, D/C planning, etc.) []  - 0 Additional  assistance / Altered mentation []  - 0 Support Surface(s) Assessment (bed, cushion, seat, etc.) INTERVENTIONS - Wound Cleansing / Measurement X - Simple Wound Cleansing - one  wound 1 5 []  - 0 Complex Wound Cleansing - multiple wounds []  - 0 Wound Imaging (photographs - any number of wounds) []  - 0 Wound Tracing (instead of photographs) []  - 0 Simple Wound Measurement - one wound []  - 0 Complex Wound Measurement - multiple wounds INTERVENTIONS - Wound Dressings X - Small Wound Dressing one or multiple wounds 1 10 []  - 0 Medium Wound Dressing one or multiple wounds []  - 0 Large Wound Dressing one or multiple wounds X- 1 5 Application of Medications - topical []  - 0 Application of Medications - injection INTERVENTIONS - Miscellaneous []  - 0 External ear exam []  - 0 Specimen Collection (cultures, biopsies, blood, body fluids, etc.) []  - 0 Specimen(s) / Culture(s) sent or taken to Lab for analysis []  - 0 Patient Transfer (multiple staff / Civil Service fast streamer / Similar devices) []  - 0 Simple Staple / Suture removal (25 or less) []  - 0 Complex Staple / Suture removal (26 or more) []  - 0 Hypo / Hyperglycemic Management (close monitor of Blood Glucose) []  - 0 Ankle / Brachial Index (ABI) - do not check if billed separately X- 1 5 Vital Signs Has the patient been seen at the hospital within the last three years: Yes Total Score: 80 Level Of Care: New/Established - Level 3 Electronic Signature(s) Signed: 02/27/2021 6:20:01 PM By: Baruch Gouty RN, BSN Entered By: Baruch Gouty on 02/26/2021 11:17:29 -------------------------------------------------------------------------------- Encounter Discharge Information Details Patient Name: [name]: [name] Banker [name] 02/26/2021 10:30 A M Medical Record Number: [id number] Patient Account Number: [id number] Date of Birth/Sex: Treating RN: 1941-03-26 (80 y.o. Ernestene Mention Primary Care Aalijah Lanphere: [name] Other  Clinician: Referring Shemeika Starzyk: Treating Abigayle Wilinski/Extender: Alean Rinne Weeks in Treatment: 2 Encounter Discharge Information Items Discharge Condition: Stable Ambulatory Status: Ambulatory Discharge Destination: Home Transportation: Private Auto Accompanied By: self Schedule Follow-up Appointment: Yes Clinical Summary of Care: Patient Declined Electronic Signature(s) Signed: 02/27/2021 6:20:01 PM By: Baruch Gouty RN, BSN Entered By: Baruch Gouty on 02/26/2021 11:16:50 -------------------------------------------------------------------------------- Pain Assessment Details Patient Name: [name]: [name] Banker [name] 02/26/2021 10:30 A M Medical Record Number: [id number] Patient Account Number: [id number] Date of Birth/Sex: Treating RN: [birth year] (80 y.o. Ernestene Mention Primary Care Lyanna Blystone: [name] Other Clinician: Referring Lile Mccurley: Treating Vencent Hauschild/Extender: Alean Rinne Weeks in Treatment: 2 Active Problems Location of Pain Severity and Description of Pain Patient Has Paino Yes Site Locations With Dressing Change: No Duration of the Pain. Constant / Intermittento Intermittent Rate the pain. Current Pain Level: 0 Worst Pain Level: 7 Least Pain Level: 0 Character of Pain Describe the Pain: Sharp, Shooting Pain Management and Medication Current Pain Management: Other: time Is the Current Pain Management Adequate: Adequate How does your wound impact your activities of daily livingo Sleep: Yes Bathing: No Appetite: No Relationship With Others: No Bladder Continence: No Emotions: No Bowel Continence: No Work: No Toileting: No Drive: No Dressing: No Hobbies: No Engineer, maintenance) Signed: 02/27/2021 6:20:01 PM By: Baruch Gouty RN, BSN Entered By: Baruch Gouty on 02/26/2021 11:05:14 -------------------------------------------------------------------------------- Patient/Caregiver  Education Details Patient Name: [name]: [name] Banker [name] 4/19/2022andnbsp10:30 A M Medical Record Number: [id number] Patient Account Number: [id number] Date of Birth/Gender: Treating RN: March 03, 1941 (80 y.o. Ernestene Mention Primary Care Physician: [name] Other Clinician: Referring Physician: Treating Physician/Extender: Lisbeth Renshaw in Treatment: 2 Education Assessment Education Provided To: Patient Education Topics Provided Wound/Skin Impairment: Methods: Explain/Verbal Responses: Reinforcements  needed, State content correctly Electronic Signature(s) Signed: 02/27/2021 6:20:01 PM By: Baruch Gouty RN, BSN Entered By: Baruch Gouty on 02/26/2021 11:16:34 -------------------------------------------------------------------------------- Wound Assessment Details Patient Name: [name]: [name] Banker [name] 02/26/2021 10:30 A M Medical Record Number: [id number] Patient Account Number: [id number] Date of Birth/Sex: Treating RN: 1941/10/19 (80 y.o. 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 Great oe Wound Open Wounding Event: Other Lesion Status: Date Acquired: 01/10/2020 Comorbid Cataracts, Chronic sinus problems/congestion, Asthma, Sleep Weeks Of Treatment: 2 History: Apnea, Hypertension, Peripheral Venous Disease, Osteoarthritis Clustered Wound: No Wound Measurements Length: (cm) 0.6 Width: (cm) 0.9 Depth: (cm) 0.2 Area: (cm) 0.424 Volume: (cm) 0.085 % Reduction in Area: 55% % Reduction in Volume: 54.8% Epithelialization: Small (1-33%) Tunneling: No Undermining: No Wound Description Classification: Full Thickness Without Exposed Support Structures Wound Margin:  Distinct, outline attached Exudate Amount: Medium Exudate Type: Serosanguineous Exudate Color: red, brown Foul Odor After Cleansing: No Slough/Fibrino Yes Wound Bed Granulation Amount: Medium (34-66%) Exposed Structure Granulation Quality: Red Fascia Exposed: No Necrotic Amount: Medium (34-66%) Fat Layer (Subcutaneous Tissue) Exposed: Yes Necrotic Quality: Adherent Slough Tendon Exposed: No Muscle Exposed: No Joint Exposed: No Bone Exposed: No Treatment Notes Wound #1 (Toe Great) Wound Laterality: Right Cleanser Wound Cleanser Discharge Instruction: Cleanse the wound with wound cleanser prior to applying a clean dressing using gauze sponges, not tissue or cotton balls. Peri-Wound Care Topical Primary Dressing Promogran Prisma Matrix, 4.34 (sq in) (silver collagen) Discharge Instruction: Moisten collagen with saline or hydrogel Secondary Dressing Woven Gauze Sponges 2x2 in Discharge Instruction: Apply over primary dressing as directed. Secured With Conforming Stretch Gauze Bandage, Sterile 2x75 (in/in) Discharge Instruction: Secure with stretch gauze as directed. Compression Wrap Compression Stockings Add-Ons Electronic Signature(s) Signed: 02/27/2021 6:20:01 PM By: Baruch Gouty RN, BSN Entered By: Baruch Gouty on 02/26/2021 11:15:28 -------------------------------------------------------------------------------- Vitals Details Patient Name: [name]: [name] Banker [name] 02/26/2021 10:30 A M Medical Record Number: [id number] Patient Account Number: [id number] Date of Birth/Sex: Treating RN: 03/10/1941 (80 y.o. Ernestene Mention Primary Care Americus Scheurich: [name] Other Clinician: Referring Damareon Lanni: Treating Franci Oshana/Extender: Alean Rinne Weeks in Treatment: 2 Vital Signs Time Taken: 11:03 Temperature (F): 97.9 Height (in): 69 Pulse (bpm): 89 Source: Stated Respiratory Rate (breaths/min): 18 Weight (lbs): 250 Blood  Pressure (mmHg): 198/73 Source: Stated Reference Range: 80 - 120 mg / dl Body Mass Index (BMI): 36.9 Electronic Signature(s) Signed: 02/27/2021 6:20:01 PM By: Baruch Gouty RN, BSN Entered By: Baruch Gouty on 02/26/2021 11:04:24

## 2021-02-28 ENCOUNTER — Other Ambulatory Visit: Payer: Self-pay

## 2021-02-28 ENCOUNTER — Encounter (HOSPITAL_BASED_OUTPATIENT_CLINIC_OR_DEPARTMENT_OTHER): Payer: Medicare Other | Admitting: Internal Medicine

## 2021-02-28 DIAGNOSIS — L97518 Non-pressure chronic ulcer of other part of right foot with other specified severity: Secondary | ICD-10-CM | POA: Diagnosis not present

## 2021-02-28 DIAGNOSIS — M48062 Spinal stenosis, lumbar region with neurogenic claudication: Secondary | ICD-10-CM | POA: Diagnosis not present

## 2021-02-28 NOTE — Progress Notes (Signed)
[name] ([id number]) Visit Report for 02/28/2021 Chief Complaint Document Details Patient Name: [name]: [name], [name] 02/28/2021 8:30 A M Medical Record Number: [id number] Patient Account Number: [id number] Date of Birth/Sex: Treating RN: 01-Feb-1941 (80 y.o. [name] Primary Care Provider: [name] Other Clinician: Referring Provider: Treating Provider/Extender: Alean Rinne Weeks in Treatment: 3 Information Obtained from: Patient Chief Complaint 02/07/2021; patient is here for review of a wound on the tip of his right great toe Electronic Signature(s) Signed: 02/28/2021 10:57:03 AM By: Kalman Shan DO Entered By: Kalman Shan on 02/28/2021 10:10:01 -------------------------------------------------------------------------------- Debridement Details Patient Name: [name]: [name] 02/28/2021 8:30 A M Medical Record Number: [id number] Patient Account Number: [id number] Date of Birth/Sex: Treating RN: September 13, 1941 (80 y.o. [name] Primary Care Provider: [name] Other Clinician: Referring Provider: Treating Provider/Extender: Alean Rinne Weeks in Treatment: 3 Debridement Performed for Assessment: Wound #1 Right T Great oe Performed By: Physician Kalman Shan, DO Debridement Type: Chemical/Enzymatic/Mechanical Agent Used: gauze and wound cleanser Level of Consciousness (Pre-procedure): Awake and Alert Pre-procedure Verification/Time Out No Taken: Bleeding: Minimum Hemostasis Achieved: Pressure Response to Treatment: Procedure was tolerated well Level of Consciousness (Post- Awake and Alert procedure): Post Debridement Measurements of Total Wound Length: (cm) 0.9 Width: (cm) 1.1 Depth: (cm) 0.2 Volume: (cm) 0.156 Character of Wound/Ulcer Post Debridement: Improved Post Procedure Diagnosis Same as Pre-procedure Electronic Signature(s) Signed: 02/28/2021  10:57:03 AM By: Kalman Shan DO Signed: 02/28/2021 5:27:07 PM By: Deon Pilling Entered By: Deon Pilling on 02/28/2021 09:39:27 -------------------------------------------------------------------------------- HPI Details Patient Name: [name]: [name] 02/28/2021 8:30 A M Medical Record Number: [id number] Patient Account Number: [id number] Date of Birth/Sex: Treating RN: August 16, 1941 (80 y.o. [name] Primary Care Provider: [name] Other Clinician: Referring Provider: Treating Provider/Extender: Alean Rinne Weeks in Treatment: 3 History of Present Illness HPI Description: ADMISSION 02/07/2021 This is a 80 year old man who is not a diabetic "prediabetic" who comes in today letter asking [place] to look at an area on his right first toe tip that has been there about a year. He has been getting this dressed that [employer] and he had Xeroform on this with gauze. The patient is not exactly sure how this came about but he is not able to dress the wound himself because of limitations due to what sounds like spinal stenosis and pain. He had a wound on the ankle as well but that healed over. He was seen by vascular in May 2021. This was related to a right great toe wound. He had an angiogram that was completely normal on both sides good perfusion right into the feet. He also looks like he has lymphedema and he has compression stockings but he cannot get them on and off so he essentially leaves he is on even when he showering Past medical history includes prediabetes, low back pain secondary I think the lumbar spinal stenosis has been offered a decompressive laminectomy but he has not gone forth with this. 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 ABI was noncompressible in our clinic on the right right first toe. 4/7; patient I admitted the clinic last week. He has an area on the tip of  his right first toe. We cleaned this off last week and have been using silver collagen. He is in the same crocs  today that he wore last week and I told him not to use. For 1 reason or another he could not handle a surgical sandal. 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 He is not a diabetic 4/12; right first toe wound chronic. We have been using silver collagen. Once again he comes in then the same pair of old crocs that he comes in every week. 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 His x-ray suggested suspicious for osteomyelitis with bone irregularity at the tuft of the right great toe 4/21; patient has been using silver collagen every other day to the wound on his right great toe. He continues to wear crocs. He has no complaints or issues today. Electronic Signature(s) Signed: 02/28/2021 10:57:03 AM By: Kalman Shan DO Entered By: Kalman Shan on 02/28/2021 10:40:16 -------------------------------------------------------------------------------- Physical Exam Details Patient Name: [name]: [name] 02/28/2021 8:30 A M Medical Record Number: [id number] Patient Account Number: [id number] Date of Birth/Sex: Treating RN: 11-24-1940 (80 y.o. [name] Primary Care Provider: [name] Other Clinician: Referring Provider: Treating Provider/Extender: Alean Rinne Weeks in Treatment: 3 Constitutional respirations regular, non-labored and within target range for patient.. Cardiovascular 2+ dorsalis pedis/posterior tibialis pulses. Notes Right great toe ulcer: Patient has granulation tissue to the wound bed although there is a gritty surface to it. This was mechanically debrided. There are no signs of infection. Overall size is slightly increased since last clinic visit. Electronic Signature(s) Signed: 02/28/2021 10:57:03 AM By: Kalman Shan DO Entered By:  Kalman Shan on 02/28/2021 10:41:22 -------------------------------------------------------------------------------- Physician Orders Details Patient Name: [name]: [name] 02/28/2021 8:30 A M Medical Record Number: [id number] Patient Account Number: [id number] Date of Birth/Sex: Treating RN: 1941/08/04 (80 y.o. 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 - Thursday Return A Nurse Visit: - Tuesday 03/05/2021 Thursday 03/07/2021 Tuesday 03/12/2021 Bathing/ Shower/ Hygiene May shower with protection but do not get wound dressing(s) wet. - use a cast protector. Edema Control - Lymphedema / SCD / Other Elevate legs to the level of the heart or above for 30 minutes daily and/or when sitting, a frequency of: - throughout the day. Avoid standing for long periods of time. Exercise regularly Moisturize legs daily. - both legs every night before bed. Off-Loading Open toe surgical shoe to: - wound center clinic to provide surgical shoe to patient to wear while walking and standing to right foot. Other: - Stop wearing the Croc slip on shoes. 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 Ensure no pressure of on the tip right great toe. Wound Treatment Wound #1 - T Great oe Wound Laterality: Right Cleanser: Wound Cleanser 2 x Per Week Discharge Instructions: Cleanse the wound with wound cleanser prior to applying a clean dressing using gauze sponges, not tissue or cotton balls. Prim Dressing: Hydrofera Blue Classic Foam, 2x2 in 2 x Per Week ary Discharge  Instructions: Moisten with saline prior to applying to wound bed Secondary Dressing: Woven Gauze Sponges 2x2 in 2 x Per Week Discharge Instructions: Apply over primary dressing as directed.  Secured With: Child psychotherapist, Sterile 2x75 (in/in) 2 x Per Week Discharge Instructions: Secure with stretch gauze as directed. Radiology MRI, lower extremity with and without contrast of right great toe. - [facility] MRI with and without contrast to right great toe related to non-healing ulcer. x-ray to right foot questioning osteomyelitis. CPT code - (ICD10 L97.518 - Non-pressure chronic ulcer of other part of right foot with other specified severity) Electronic Signature(s) Signed: 02/28/2021 10:57:03 AM By: Kalman Shan DO Entered By: Kalman Shan on 02/28/2021 10:41:36 Prescription 02/28/2021 -------------------------------------------------------------------------------- [name]. Kalman Shan DO Patient Name: Provider: 01-16-1941 [id number] Date of Birth: NPI#: Jerilynn Mages [id number] Sex: DEA #: [phone] Phone #: License #: [place] Patient Address: [address] [place], [place] [address] [place], [place] [address] [phone] Allergies No Known Allergies Provider's Orders MRI, lower extremity with and without contrast of right great toe. - ICD10: L97.518 - [facility] MRI with and without contrast to right great toe related to non-healing ulcer. x-ray to right foot questioning osteomyelitis. CPT code Hand Signature: Date(s): Electronic Signature(s) Signed: 02/28/2021 10:57:03 AM By: Kalman Shan DO Entered By: Kalman Shan on 02/28/2021 10:56:17 -------------------------------------------------------------------------------- Problem List Details Patient Name: [name]: [name] 02/28/2021 8:30 A M Medical Record Number:  [id number] Patient Account Number: [id number] Date of Birth/Sex: Treating RN: [birth year] (80 y.o. 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 02/28/2021 8:30 A M Medical Record Number: [id number] Patient Account Number: [id number] Date of Birth/Sex: Treating RN: 04-Apr-1941 (80 y.o. [name] Primary Care Provider: [name] Other Clinician: Referring Provider: Treating Provider/Extender: Alean Rinne Weeks in Treatment: 3 Subjective Chief Complaint Information obtained from Patient 02/07/2021; patient is here for review of a wound on the tip of his right great toe History of Present Illness (HPI) ADMISSION 02/07/2021 This is a 80 year old man who is not a diabetic "prediabetic" who comes in today letter asking [place] to look at an area on his right first toe tip that has been there about a year. He has been getting this dressed that [employer] and he had Xeroform on this with gauze. The patient is not exactly sure how this came about but he is not able to dress the wound himself because of limitations due to what sounds like spinal stenosis and pain. He had a wound on the ankle as well but that healed over. He was seen by vascular in May  2021. This was related to a right great toe wound. He had an angiogram that was completely normal on both sides good perfusion right into the feet. He also looks like he has lymphedema and he has compression stockings but he cannot get them on and off so he essentially leaves he is on even when he showering Past medical history includes prediabetes, low back pain secondary I think the lumbar spinal stenosis has been offered a decompressive  laminectomy but he has not gone forth with this. 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 ABI was noncompressible in our clinic on the right right first toe. 4/7; patient I admitted the clinic last week. He has an area on the tip of his right first toe. We cleaned this off last week and have been using silver collagen. He is in the same crocs today that he wore last week and I told him not to use. For 1 reason or another he could not handle a surgical sandal. 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 He is not a diabetic 4/12; right first toe wound chronic. We have been using silver collagen. Once again he comes in then the same pair of old crocs that he comes in every week. 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 His x-ray suggested suspicious for osteomyelitis with bone irregularity at the tuft of the right great toe 4/21; patient has been using silver collagen every other day to the wound on his right great toe. He continues to wear crocs. He has no complaints or issues today. Patient History Information obtained from Patient. Family History Diabetes - Maternal Grandparents, Heart Disease - Father, Hypertension - Father, Stroke - Father, Thyroid Problems - Mother,Child, No family history of Cancer, Hereditary Spherocytosis, Kidney Disease, Lung Disease, Seizures, Tuberculosis. Social History Never smoker, Marital  Status - Single, Alcohol Use - Rarely, Drug Use - No History, Caffeine Use - Daily - coffee. Medical History Eyes Patient has history of Cataracts - 2019 Ear/Nose/Mouth/Throat Patient has history of Chronic sinus problems/congestion Respiratory Patient has history of Asthma, Sleep Apnea Cardiovascular Patient has history of Hypertension, Peripheral Venous Disease Musculoskeletal Patient has history of Osteoarthritis Medical A Surgical History Notes nd Gastrointestinal GERD Genitourinary BPH Objective Constitutional respirations regular, non-labored and within target range for patient.. Vitals Time Taken: 9:14 AM, Height: 69 in, Weight: 250 lbs, BMI: 36.9, Temperature: 98 F, Pulse: 79 bpm, Respiratory Rate: 20 breaths/min, Blood Pressure: 172/64 mmHg. Cardiovascular 2+ dorsalis pedis/posterior tibialis pulses. General Notes: Right great toe ulcer: Patient has granulation tissue to the wound bed although there is a gritty surface to it. This was mechanically debrided. There are no signs of infection. Overall size is slightly increased since last clinic visit. Integumentary (Hair, Skin) Wound #1 status is Open. Original cause of wound was Other Lesion. The date acquired was: 01/10/2020. The wound has been in treatment 3 weeks. The wound is located on the Right T Great. The wound measures 0.9cm length x 1.1cm width x 0.2cm depth; 0.778cm^2 area and 0.156cm^3 volume. There is Fat Layer oe (Subcutaneous Tissue) exposed. There is no tunneling or undermining noted. There is a medium amount of serosanguineous drainage noted. The wound margin is distinct with the outline attached to the wound base. There is large (67-100%) red granulation within the wound bed. There is a small (1-33%) amount of necrotic tissue within the wound bed including Adherent Slough. 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 Would like to obtain an MRI at this time since his wound is not improving. We will need to switch from silver collagen to [facility] because of the MRI. Overall there are  no signs of infection today. 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 There was a Chemical/Enzymatic/Mechanical oe debridement performed by Kalman Shan, DO.. Other agent used was gauze and wound cleanser. A Minimum amount of bleeding was controlled with Pressure. The procedure was tolerated well. Post Debridement Measurements: 0.9cm length x 1.1cm width x 0.2cm depth; 0.156cm^3 volume. Character of Wound/Ulcer Post Debridement is improved. Post procedure Diagnosis Wound #1: Same as Pre-Procedure Plan Follow-up Appointments: Return Appointment in 2 weeks. - Thursday Nurse Visit: - Tuesday 03/05/2021 Thursday 03/07/2021 Tuesday 03/12/2021 Bathing/ Shower/ Hygiene: May shower with protection but do not get wound dressing(s) wet. - use a cast protector. Edema Control - Lymphedema / SCD / Other: Elevate legs to the level of the heart or above for 30 minutes daily and/or when sitting, a frequency of: - throughout the day. Avoid standing for long periods of time. Exercise regularly Moisturize legs daily. - both legs every night before bed. Off-Loading: Open toe surgical shoe to: - wound center clinic to provide surgical shoe to patient to wear while walking and standing to right foot. Other: - Stop wearing the Croc slip on shoes. 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 Ensure no pressure of on the tip right great toe. Radiology ordered were: MRI, lower extremity with and without contrast of right great toe. - [facility] MRI with and without contrast to right great toe related to non-healing ulcer. x-ray  to right foot questioning osteomyelitis. CPT code WOUND #1: - T Great Wound Laterality: Right oe Cleanser: Wound Cleanser 2 x Per Week/ Discharge Instructions: Cleanse the wound with wound cleanser prior to applying a clean dressing using gauze sponges, not tissue or cotton balls. Prim Dressing: Hydrofera Blue Classic Foam, 2x2 in 2 x Per Week/ ary Discharge Instructions: Moisten with saline prior to applying to wound bed Secondary Dressing: Woven Gauze Sponges 2x2 in 2 x Per Week/ Discharge Instructions: Apply over primary dressing as directed. Secured With: Child psychotherapist, Sterile 2x75 (in/in) 2 x Per Week/ Discharge Instructions: Secure with stretch gauze as directed. 1. MRI of the right foot 2. Hydrofera Blue every other day 3. Follow-up in 2 weeks Electronic Signature(s) Signed: 02/28/2021 10:57:03 AM By: Kalman Shan DO Entered By: Kalman Shan on 02/28/2021 10:44:00 -------------------------------------------------------------------------------- HxROS Details Patient Name: [name]: [name] 02/28/2021 8:30 A M Medical Record Number: [id number] Patient Account Number: [id number] Date of Birth/Sex: Treating RN: 1941/04/05 (80 y.o. [name], [name] Other Clinician: Referring Provider: Treating Provider/Extender: Alean Rinne Weeks in Treatment: 3 Information Obtained From Patient Eyes Medical History: Positive for: Cataracts - 2019 Ear/Nose/Mouth/Throat Medical History: Positive for: Chronic sinus problems/congestion Respiratory Medical History: Positive for: Asthma; Sleep Apnea Cardiovascular Medical History: Positive for: Hypertension; Peripheral Venous Disease Gastrointestinal Medical History: Past Medical History Notes: GERD Genitourinary Medical History: Past Medical History Notes: BPH Musculoskeletal Medical History: Positive for: Osteoarthritis HBO  Extended History Items Ear/Nose/Mouth/Throat: Eyes: Chronic sinus Cataracts problems/congestion Immunizations Pneumococcal Vaccine: Received Pneumococcal Vaccination: Yes Implantable Devices None Family and Social History Cancer: No; Diabetes: Yes - Maternal Grandparents; Heart Disease: Yes - Father; Hereditary Spherocytosis: No; Hypertension: Yes - Father; Kidney Disease: No; Lung Disease: No; Seizures: No; Stroke: Yes - Father; Thyroid Problems: Yes - Mother,Child; Tuberculosis: No; Never smoker; Marital Status - Single; Alcohol Use: Rarely; Drug Use: No History; Caffeine Use: Daily - coffee; Financial  Concerns: No; Food, Clothing or Shelter Needs: No; Support System Lacking: No; Transportation Concerns: No Electronic Signature(s) Signed: 02/28/2021 10:57:03 AM By: Kalman Shan DO Signed: 02/28/2021 5:27:07 PM By: Deon Pilling Entered By: Kalman Shan on 02/28/2021 10:40:22 -------------------------------------------------------------------------------- SuperBill Details Patient Name: [name]: [name] 02/28/2021 Medical Record Number: [id number] Patient Account Number: [id number] Date of Birth/Sex: Treating RN: 1941-10-01 (80 y.o. 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

## 2021-03-01 ENCOUNTER — Other Ambulatory Visit: Payer: Self-pay | Admitting: Internal Medicine

## 2021-03-01 DIAGNOSIS — L97518 Non-pressure chronic ulcer of other part of right foot with other specified severity: Secondary | ICD-10-CM

## 2021-03-05 ENCOUNTER — Encounter (HOSPITAL_BASED_OUTPATIENT_CLINIC_OR_DEPARTMENT_OTHER): Payer: Medicare Other | Admitting: Internal Medicine

## 2021-03-05 ENCOUNTER — Other Ambulatory Visit: Payer: Self-pay

## 2021-03-05 DIAGNOSIS — L97518 Non-pressure chronic ulcer of other part of right foot with other specified severity: Secondary | ICD-10-CM | POA: Diagnosis not present

## 2021-03-05 DIAGNOSIS — M48062 Spinal stenosis, lumbar region with neurogenic claudication: Secondary | ICD-10-CM | POA: Diagnosis not present

## 2021-03-05 NOTE — Progress Notes (Signed)
[name], [name] ([id number]) Visit Report for 02/28/2021 Arrival Information Details Patient Name: [name]: [name], [name] [name] 02/28/2021 8:30 A M Medical Record Number: [id number] Patient Account Number: [id number] Date of Birth/Sex: Treating RN: October 17, 1941 (80 y.o. [name], [name] Primary Care Desteni Piscopo: [name] Other Clinician: Referring Osceola Depaz: Treating Denene Alamillo/Extender: Alean Rinne Weeks in Treatment: 3 Visit Information History Since Last Visit Added or deleted any medications: No Patient Arrived: Ambulatory Any new allergies or adverse reactions: No Arrival Time: 09:14 Had a fall or experienced change in No Accompanied By: self activities of daily living that may affect Transfer Assistance: None risk of falls: Patient Identification Verified: Yes Signs or symptoms of abuse/neglect since No Secondary Verification Process Completed: Yes last visito Patient Requires Transmission-Based Precautions: No Hospitalized since last visit: No Patient Has Alerts: Yes Implantable device outside of the clinic No Patient Alerts: R ABI non compressible excluding cellular tissue based products placed in the center since last visit: Has Dressing in Place as Prescribed: Yes Has Footwear/Offloading in Place as No Prescribed: Right: Surgical Shoe with Pressure Relief Insole Pain Present Now: No Electronic Signature(s) Signed: 02/28/2021 5:27:07 PM By: Deon Pilling Entered By: Deon Pilling on 02/28/2021 09:14:53 -------------------------------------------------------------------------------- Lower Extremity Assessment Details Patient Name: [name]: [name] [name] 02/28/2021 8:30 A M Medical Record Number: [id number] Patient Account Number: [id number] Date of Birth/Sex: Treating RN: 10/31/1941 (80 y.o. [name] Primary Care Scotlyn Mccranie: [name] Other Clinician: Referring Pleasant Britz: Treating Caleyah Jr/Extender: Alean Rinne Weeks in Treatment: 3 Edema Assessment Assessed: [name]: No] [Right: Yes] Edema: [Left: Ye] [Right: s] Calf Left: Right: Point of Measurement: 33 cm From Medial Instep 37 cm Ankle Left: Right: Point of Measurement: 10 cm From Medial Instep 23 cm Vascular Assessment Pulses: Dorsalis Pedis Palpable: [Right:Yes] Electronic Signature(s) Signed: 02/28/2021 5:27:07 PM By: Deon Pilling Entered By: Deon Pilling on 02/28/2021 09:22:04 -------------------------------------------------------------------------------- Multi Wound Chart Details Patient Name: [name]: [name] [name] 02/28/2021 8:30 A M Medical Record Number: [id number] Patient Account Number: [id number] Date of Birth/Sex: Treating RN: 14-Jan-1941 (80 y.o. 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] [N/A:N/A] Primary Etiology: [1:Cataracts, Chronic sinus] [N/A:N/A] Comorbid History: [1:problems/congestion, Asthma, Sleep Apnea, Hypertension, Peripheral Venous Disease, Osteoarthritis 01/10/2020] [N/A:N/A] Date Acquired: [1:3] [N/A:N/A] Weeks of Treatment: [1:Open] [N/A:N/A] Wound Status: [1:0.9x1.1x0.2] [N/A:N/A] Measurements L x W x D (cm) [1:0.778] [N/A:N/A] A (cm) : rea [1:0.156] [N/A:N/A] Volume (cm) : [1:17.40%] [N/A:N/A] % Reduction in A [1:rea: 17.00%] [N/A:N/A] % Reduction in Volume: [1:Full Thickness Without Exposed] [N/A:N/A] Classification: [1:Support Structures Medium] [N/A:N/A] Exudate A mount: [1:Serosanguineous]  [N/A:N/A] Exudate Type: [1:red, brown] [N/A:N/A] Exudate Color: [1:Distinct, outline attached] [N/A:N/A] Wound Margin: [1:Large (67-100%)] [N/A:N/A] Granulation A mount: [1:Red] [N/A:N/A] Granulation Quality: [1:Small (1-33%)] [N/A:N/A] Necrotic A mount: [1:Fat Layer (Subcutaneous Tissue): Yes N/A] Exposed Structures: [1:Fascia: No Tendon: No Muscle: No Joint: No Bone: No Small (1-33%)] [N/A:N/A] Epithelialization: [1:Chemical/Enzymatic/Mechanical] [N/A:N/A] Debridement: [1:N/A] [N/A:N/A] Instrument: [1:Minimum] [N/A:N/A] Bleeding: [1:Pressure] [N/A:N/A] Hemostasis A chieved: Debridement Treatment Response: Procedure was tolerated well [N/A:N/A] Post Debridement Measurements L x 0.9x1.1x0.2 [N/A:N/A] W x D (cm) [1:0.156] [N/A:N/A] Post Debridement Volume: (cm) [1:Debridement] [N/A:N/A]  Procedures Performed: Treatment Notes Electronic Signature(s) Signed: 02/28/2021 10:57:03 AM By: Kalman Shan DO Signed: 02/28/2021 5:27:07 PM By: Deon Pilling Entered By: Kalman Shan on 02/28/2021 10:09:54 -------------------------------------------------------------------------------- Multi-Disciplinary Care Plan Details Patient Name: [name]: [name] [name] 02/28/2021 8:30 A M Medical Record Number: [id number] Patient Account Number: [id number] Date of Birth/Sex: Treating RN: 08/05/1941 (80 y.o. [name], [name] Primary Care Kamani Magnussen: [name] Other Clinician: Referring Cassady Stanczak: Treating Shavy Beachem/Extender: Alean Rinne Weeks in Treatment: 3 Active Inactive Necrotic Tissue Nursing Diagnoses: Impaired tissue integrity related to necrotic/devitalized tissue Knowledge deficit related to management of necrotic/devitalized tissue Goals: Necrotic/devitalized tissue will be minimized in the wound bed Date Initiated: 02/07/2021 Target Resolution Date: 04/24/2021 Goal Status: Active Patient/caregiver will verbalize understanding of reason and  process for debridement of necrotic tissue Date Initiated: 02/07/2021 Target Resolution Date: 03/29/2021 Goal Status: Active Interventions: Assess patient pain level pre-, during and post procedure and prior to discharge Provide education on necrotic tissue and debridement process Treatment Activities: T ordered outside of clinic : 02/07/2021 est Notes: Pain, Acute or Chronic Nursing Diagnoses: Pain, acute or chronic: actual or potential Potential alteration in comfort, pain Goals: Patient will verbalize adequate pain control and receive pain control interventions during procedures as needed Date Initiated: 02/07/2021 Target Resolution Date: 03/29/2021 Goal Status: Active Patient/caregiver will verbalize comfort level met Date Initiated: 02/07/2021 Date Inactivated: 02/28/2021 Target Resolution Date: 03/08/2021 Goal Status: Met Interventions: Complete pain assessment as per visit requirements Provide education on pain management Reposition patient for comfort Treatment Activities: Administer pain control measures as ordered : 02/07/2021 Notes: Electronic Signature(s) Signed: 02/28/2021 5:27:07 PM By: Deon Pilling Entered By: Deon Pilling on 02/28/2021 09:23:47 -------------------------------------------------------------------------------- Pain Assessment Details Patient Name: [name]: [name] [name] 02/28/2021 8:30 A M Medical Record Number: [id number] Patient Account Number: [id number] Date of Birth/Sex: Treating RN: 1941-05-03 (80 y.o. [name], [name] Primary Care Rasean Joos: [name] Other Clinician: Referring Tavarion Babington: Treating Whitman Meinhardt/Extender: Alean Rinne Weeks in Treatment: 3 Active Problems Location of Pain Severity and Description of Pain Patient Has Paino No Site Locations Pain Management and Medication Current Pain Management: Medication: No Cold Application: No Rest: No Massage: No Activity: No T.E.N.S.: No Heat  Application: No Leg drop or elevation: No Is the Current Pain Management Adequate: Adequate How does your wound impact your activities of daily livingo Sleep: No Bathing: No Appetite: No Relationship With Others: No Bladder Continence: No Emotions: No Bowel Continence: No Work: No Toileting: No Drive: No Dressing: No Hobbies: No Notes Per patient pain at night at times. Electronic Signature(s) Signed: 02/28/2021 5:27:07 PM By: Deon Pilling Entered By: Deon Pilling on 02/28/2021 09:20:32 -------------------------------------------------------------------------------- Patient/Caregiver Education Details Patient Name: [name]: [name] [name] 4/21/2022andnbsp8:30 A M Medical Record Number: [id number] Patient Account Number: [id number] Date of Birth/Gender: Treating RN: 27-Sep-1941 (80 y.o. [name] Primary Care Physician: [name] Other Clinician: Referring Physician: Treating Physician/Extender: Alean Rinne Weeks in Treatment: 3 Education Assessment Education Provided To: Patient Education Topics Provided Pain: Handouts: A Guide to Pain Control Methods: Explain/Verbal Responses: Reinforcements needed Electronic Signature(s) Signed: 02/28/2021 5:27:07 PM By: Deon Pilling Entered By: Deon Pilling on 02/28/2021 09:23:59 -------------------------------------------------------------------------------- Wound Assessment Details Patient Name: [name]: [name] [name] 02/28/2021 8:30 A M Medical Record Number: [id number] Patient Account Number: [id number] Date of Birth/Sex: Treating RN: 10-14-1941 (80 y.o. [name] Primary Care Arushi Partridge: [name] Other Clinician: Referring  Cionna Collantes: Treating Deanie Jupiter/Extender: Alean Rinne Weeks in Treatment: 3 Wound Status Wound Number: 1 Primary Neuropathic Ulcer-Non Diabetic Etiology: Wound Location: Right T Great oe Wound  Open Wounding Event: Other Lesion Status: Date Acquired: 01/10/2020 Comorbid Cataracts, Chronic sinus problems/congestion, Asthma, Sleep Weeks Of Treatment: 3 History: Apnea, Hypertension, Peripheral Venous Disease, Osteoarthritis Clustered Wound: No Photos Wound Measurements Length: (cm) 0.9 Width: (cm) 1.1 Depth: (cm) 0.2 Area: (cm) 0.778 Volume: (cm) 0.156 % Reduction in Area: 17.4% % Reduction in Volume: 17% Epithelialization: Small (1-33%) Tunneling: No Undermining: No Wound Description Classification: Full Thickness Without Exposed Support Structures Wound Margin: Distinct, outline attached Exudate Amount: Medium Exudate Type: Serosanguineous Exudate Color: red, brown Foul Odor After Cleansing: No Slough/Fibrino Yes Wound Bed Granulation Amount: Large (67-100%) Exposed Structure Granulation Quality: Red Fascia Exposed: No Necrotic Amount: Small (1-33%) Fat Layer (Subcutaneous Tissue) Exposed: Yes Necrotic Quality: Adherent Slough Tendon Exposed: No Muscle Exposed: No Joint Exposed: No Bone Exposed: No Electronic Signature(s) Signed: 02/28/2021 5:27:07 PM By: Deon Pilling Signed: 03/05/2021 2:09:33 PM By: Sandre Kitty Entered By: Sandre Kitty on 02/28/2021 16:44:18 -------------------------------------------------------------------------------- Vitals Details Patient Name: [name]: [name] [name] 02/28/2021 8:30 A M Medical Record Number: [id number] Patient Account Number: [id number] Date of Birth/Sex: Treating RN: Sep 09, 1941 (80 y.o. [name], [name] Primary Care Taneshia Lorence: [name] Other Clinician: Referring Leigh Blas: Treating Gabbi Whetstone/Extender: Alean Rinne Weeks in Treatment: 3 Vital Signs Time Taken: 09:14 Temperature (F): 98 Height (in): 69 Pulse (bpm): 79 Weight (lbs): 250 Respiratory Rate (breaths/min): 20 Body Mass Index (BMI): 36.9 Blood Pressure (mmHg): 172/64 Reference Range: 80 - 120 mg  / dl Electronic Signature(s) Signed: 02/28/2021 5:27:07 PM By: Deon Pilling Entered By: Deon Pilling on 02/28/2021 09:20:43

## 2021-03-06 NOTE — Progress Notes (Signed)
JOHARI, PINNEY ([id number]) Visit Report for 03/05/2021 Arrival Information Details Patient Name: [name]: PER, BEAGLEY [name] 03/05/2021 9:00 A M Medical Record Number: [id number] Patient Account Number: [id number] Date of Birth/Sex: Treating RN: Nov 06, 1941 (80 y.o. [name] Primary Care Dezyre Hoefer: [name] Other Clinician: Referring Rami Waddle: Treating Vikkie Goeden/Extender: Alean Rinne Weeks in Treatment: 3 Visit Information History Since Last Visit Added or deleted any medications: No Patient Arrived: Ambulatory Any new allergies or adverse reactions: No Arrival Time: 09:38 Had a fall or experienced change in No Transfer Assistance: None activities of daily living that may affect Patient Identification Verified: Yes risk of falls: Secondary Verification Process Completed: Yes Signs or symptoms of abuse/neglect since last visito No Patient Requires Transmission-Based Precautions: No Hospitalized since last visit: No Patient Has Alerts: Yes Implantable device outside of the clinic excluding No Patient Alerts: R ABI non compressible cellular tissue based products placed in the center since last visit: Has Dressing in Place as Prescribed: Yes Pain Present Now: No Electronic Signature(s) Signed: 03/05/2021 5:25:58 PM By: Lorrin Jackson Entered By: Lorrin Jackson on 03/05/2021 09:39:16 -------------------------------------------------------------------------------- Clinic Level of Care Assessment Details Patient Name: [name]: RODY, KEADLE [name] 03/05/2021 9:00 A M Medical Record Number: [id number] Patient Account Number: [id number] Date of Birth/Sex: Treating RN: 09-13-1941 (80 y.o. [name] Primary Care Fate Caster: [name] Other Clinician: Referring Elroy Schembri: Treating Ansel Ferrall/Extender: Alean Rinne Weeks in Treatment: 3 Clinic Level of Care Assessment Items TOOL 4 Quantity Score X- 1  0 Use when only an EandM is performed on FOLLOW-UP visit ASSESSMENTS - Nursing Assessment / Reassessment X- 1 10 Reassessment of Co-morbidities (includes updates in patient status) X- 1 5 Reassessment of Adherence to Treatment Plan ASSESSMENTS - Wound and Skin A ssessment / Reassessment X - Simple Wound Assessment / Reassessment - one wound 1 5 []  - 0 Complex Wound Assessment / Reassessment - multiple wounds []  - 0 Dermatologic / Skin Assessment (not related to wound area) ASSESSMENTS - Focused Assessment []  - 0 Circumferential Edema Measurements - multi extremities []  - 0 Nutritional Assessment / Counseling / Intervention []  - 0 Lower Extremity Assessment (monofilament, tuning fork, pulses) []  - 0 Peripheral Arterial Disease Assessment (using hand held doppler) ASSESSMENTS - Ostomy and/or Continence Assessment and Care []  - 0 Incontinence Assessment and Management []  - 0 Ostomy Care Assessment and Management (repouching, etc.) PROCESS - Coordination of Care []  - 0 Simple Patient / Family Education for ongoing care X- 1 20 Complex (extensive) Patient / Family Education for ongoing care X- 1 10 Staff obtains Programmer, systems, Records, T Results / Process Orders est []  - 0 Staff telephones HHA, Nursing Homes / Clarify orders / etc []  - 0 Routine Transfer to another Facility (non-emergent condition) []  - 0 Routine Hospital Admission (non-emergent condition) []  - 0 New Admissions / Biomedical engineer / Ordering NPWT Apligraf, etc. , []  - 0 Emergency Hospital Admission (emergent condition) []  - 0 Simple Discharge Coordination []  - 0 Complex (extensive) Discharge Coordination PROCESS - Special Needs []  - 0 Pediatric / Minor Patient Management []  - 0 Isolation Patient Management []  - 0 Hearing / Language / Visual special needs []  - 0 Assessment of Community assistance (transportation, D/C planning, etc.) []  - 0 Additional assistance / Altered mentation []  -  0 Support Surface(s) Assessment (bed, cushion, seat, etc.) INTERVENTIONS - Wound Cleansing / Measurement X - Simple Wound Cleansing - one wound 1 5 []  - 0  Complex Wound Cleansing - multiple wounds []  - 0 Wound Imaging (photographs - any number of wounds) []  - 0 Wound Tracing (instead of photographs) X- 1 5 Simple Wound Measurement - one wound []  - 0 Complex Wound Measurement - multiple wounds INTERVENTIONS - Wound Dressings X - Small Wound Dressing one or multiple wounds 1 10 []  - 0 Medium Wound Dressing one or multiple wounds []  - 0 Large Wound Dressing one or multiple wounds []  - 0 Application of Medications - topical []  - 0 Application of Medications - injection INTERVENTIONS - Miscellaneous []  - 0 External ear exam []  - 0 Specimen Collection (cultures, biopsies, blood, body fluids, etc.) []  - 0 Specimen(s) / Culture(s) sent or taken to Lab for analysis []  - 0 Patient Transfer (multiple staff / Civil Service fast streamer / Similar devices) []  - 0 Simple Staple / Suture removal (25 or less) []  - 0 Complex Staple / Suture removal (26 or more) []  - 0 Hypo / Hyperglycemic Management (close monitor of Blood Glucose) []  - 0 Ankle / Brachial Index (ABI) - do not check if billed separately X- 1 5 Vital Signs Has the patient been seen at the hospital within the last three years: Yes Total Score: 75 Level Of Care: New/Established - Level 2 Electronic Signature(s) Signed: 03/05/2021 5:52:22 PM By: Lorrin Jackson Entered By: Lorrin Jackson on 03/05/2021 17:52:03 -------------------------------------------------------------------------------- Encounter Discharge Information Details [name] 03/05/2021 9:00 A M Medical Record Number: [id number] Patient Account Number: [id number] Date of Birth/Sex: Treating RN: Nov 30, 1940 (80 y.o. [name] Primary Care Deshondra Worst: [name] Other Clinician: Referring Yexalen Deike: Treating  Davionne Dowty/Extender: Alean Rinne Weeks in Treatment: 3 Encounter Discharge Information Items Discharge Condition: Stable Ambulatory Status: Ambulatory Discharge Destination: Home Transportation: Private Auto Schedule Follow-up Appointment: Yes Clinical Summary of Care: Provided on 03/05/2021 Form Type Recipient Paper Patient Patient Electronic Signature(s) Signed: 03/05/2021 5:51:18 PM By: Lorrin Jackson Entered By: Lorrin Jackson on 03/05/2021 17:51:18 -------------------------------------------------------------------------------- Patient/Caregiver Education Details [name] 4/26/2022andnbsp9:00 A M Medical Record Number: [id number] Patient Account Number: [id number] Date of Birth/Gender: Treating RN: May 01, 1941 (80 y.o. [name] Primary Care Physician: [name] Other Clinician: Referring Physician: Treating Physician/Extender: Lisbeth Renshaw in Treatment: 3 Education Assessment Education Provided To: Patient Education Topics Provided Offloading: Methods: Explain/Verbal Responses: State content correctly Wound/Skin Impairment: Methods: Explain/Verbal Responses: State content correctly Electronic Signature(s) Signed: 03/05/2021 5:52:22 PM By: Lorrin Jackson Entered By: Lorrin Jackson on 03/05/2021 17:50:45 -------------------------------------------------------------------------------- Wound Assessment Details [name] 03/05/2021 9:00 A M Medical Record Number: [id number] Patient Account Number: [id number] Date of Birth/Sex: Treating RN: [birth year] (80 y.o. [name] Primary Care Torre Pikus: [name] Other Clinician: Referring Joaquim Tolen: Treating Keyontay Stolz/Extender: Alean Rinne Weeks in Treatment: 3 Wound Status Wound Number: 1 Primary Neuropathic Ulcer-Non Diabetic Etiology: Wound Location:  Right T Great oe Wound Open Wounding Event: Other Lesion Status: Date Acquired: 01/10/2020 Comorbid Cataracts, Chronic sinus problems/congestion, Asthma, Sleep Weeks Of Treatment: 3 History: Apnea, Hypertension, Peripheral Venous Disease, Osteoarthritis Clustered Wound: No Wound Measurements Length: (cm) 0.9 Width: (cm) 1.1 Depth: (cm) 0.2 Area: (cm) 0.778 Volume: (cm) 0.156 % Reduction in Area: 17.4% % Reduction in Volume: 17% Epithelialization: Small (1-33%) Tunneling: No Undermining: No Wound Description Classification: Full Thickness Without Exposed Support Structures Wound Margin: Distinct, outline attached Exudate Amount: Medium Exudate Type: Serosanguineous Exudate Color: red,  brown Foul Odor After Cleansing: No Slough/Fibrino Yes Wound Bed Granulation Amount: Large (67-100%) Exposed Structure Granulation Quality: Red Fascia Exposed: No Necrotic Amount: Small (1-33%) Fat Layer (Subcutaneous Tissue) Exposed: Yes Necrotic Quality: Adherent Slough Tendon Exposed: No Muscle Exposed: No Joint Exposed: No Bone Exposed: No Treatment Notes Wound #1 (Toe Great) Wound Laterality: Right Cleanser Wound Cleanser Discharge Instruction: Cleanse the wound with wound cleanser prior to applying a clean dressing using gauze sponges, not tissue or cotton balls. Peri-Wound Care Topical Primary Dressing Hydrofera Blue Classic Foam, 2x2 in Discharge Instruction: Moisten with saline prior to applying to wound bed Secondary Dressing Woven Gauze Sponges 2x2 in Discharge Instruction: Apply over primary dressing as directed. Secured With Conforming Stretch Gauze Bandage, Sterile 2x75 (in/in) Discharge Instruction: Secure with stretch gauze as directed. Compression Wrap Compression Stockings Add-Ons Electronic Signature(s) Signed: 03/05/2021 5:25:58 PM By: Lorrin Jackson Entered By: Lorrin Jackson on 03/05/2021  09:40:20 -------------------------------------------------------------------------------- Vitals Details [name] 03/05/2021 9:00 A M Medical Record Number: [id number] Patient Account Number: [id number] Date of Birth/Sex: Treating RN: 05/22/1941 (80 y.o. [name] Primary Care Osmany Azer: [name] Other Clinician: Referring Ason Heslin: Treating Wandell Scullion/Extender: Alean Rinne Weeks in Treatment: 3 Vital Signs Time Taken: 09:38 Temperature (F): 97.7 Height (in): 69 Pulse (bpm): 79 Weight (lbs): 250 Respiratory Rate (breaths/min): 18 Body Mass Index (BMI): 36.9 Blood Pressure (mmHg): 151/76 Reference Range: 80 - 120 mg / dl Electronic Signature(s) Signed: 03/05/2021 5:25:58 PM By: Lorrin Jackson Entered By: Lorrin Jackson on 03/05/2021 09:39:51

## 2021-03-07 ENCOUNTER — Encounter (HOSPITAL_BASED_OUTPATIENT_CLINIC_OR_DEPARTMENT_OTHER): Payer: Medicare Other | Admitting: Internal Medicine

## 2021-03-08 ENCOUNTER — Other Ambulatory Visit: Payer: Self-pay

## 2021-03-08 ENCOUNTER — Encounter (HOSPITAL_BASED_OUTPATIENT_CLINIC_OR_DEPARTMENT_OTHER): Payer: Medicare Other | Admitting: Internal Medicine

## 2021-03-08 DIAGNOSIS — L97518 Non-pressure chronic ulcer of other part of right foot with other specified severity: Secondary | ICD-10-CM | POA: Diagnosis not present

## 2021-03-08 DIAGNOSIS — M48062 Spinal stenosis, lumbar region with neurogenic claudication: Secondary | ICD-10-CM | POA: Diagnosis not present

## 2021-03-08 NOTE — Progress Notes (Signed)
[name], Hanson ([id number]) Visit Report for 03/08/2021 Arrival Information Details Patient Name: [name]: [name], [name] [name] 03/08/2021 9:15 A M Medical Record Number: [id number] Patient Account Number: [id number] Date of Birth/Sex: Treating RN: 1941-10-08 (80 y.o. [name] Primary Care Shelle Galdamez: [name] Other Clinician: Referring Kseniya Grunden: Treating Annina Piotrowski/Extender: Alean Rinne Weeks in Treatment: 4 Visit Information History Since Last Visit Added or deleted any medications: No Patient Arrived: Ambulatory Any new allergies or adverse reactions: No Arrival Time: 09:53 Had a fall or experienced change in No Accompanied By: self activities of daily living that may affect Transfer Assistance: None risk of falls: Patient Identification Verified: Yes Signs or symptoms of abuse/neglect since last visito No Secondary Verification Process Completed: Yes Hospitalized since last visit: No Patient Requires Transmission-Based Precautions: No Implantable device outside of the clinic excluding No Patient Has Alerts: Yes cellular tissue based products placed in the center Patient Alerts: R ABI non compressible since last visit: Has Dressing in Place as Prescribed: Yes Pain Present Now: No Electronic Signature(s) Signed: 03/08/2021 4:35:52 PM By: Sandre Kitty Entered By: Sandre Kitty on 03/08/2021 09:56:22 -------------------------------------------------------------------------------- Clinic Level of Care Assessment Details Patient Name: [name]: [name], MCKINNON [name] 03/08/2021 9:15 A M Medical Record Number: [id number] Patient Account Number: [id number] Date of Birth/Sex: Treating RN: 04-01-1941 (80 y.o. [name], [name] Primary Care Million Maharaj: [name] Other Clinician: Referring Heyward Douthit: Treating Carlie Solorzano/Extender: Alean Rinne Weeks in Treatment: 4 Clinic Level of Care Assessment Items TOOL 4  Quantity Score X- 1 0 Use when only an EandM is performed on FOLLOW-UP visit ASSESSMENTS - Nursing Assessment / Reassessment X- 1 10 Reassessment of Co-morbidities (includes updates in patient status) X- 1 5 Reassessment of Adherence to Treatment Plan ASSESSMENTS - Wound and Skin A ssessment / Reassessment X - Simple Wound Assessment / Reassessment - one wound 1 5 []  - 0 Complex Wound Assessment / Reassessment - multiple wounds X- 1 10 Dermatologic / Skin Assessment (not related to wound area) ASSESSMENTS - Focused Assessment []  - 0 Circumferential Edema Measurements - multi extremities X- 1 10 Nutritional Assessment / Counseling / Intervention []  - 0 Lower Extremity Assessment (monofilament, tuning fork, pulses) []  - 0 Peripheral Arterial Disease Assessment (using hand held doppler) ASSESSMENTS - Ostomy and/or Continence Assessment and Care []  - 0 Incontinence Assessment and Management []  - 0 Ostomy Care Assessment and Management (repouching, etc.) PROCESS - Coordination of Care X - Simple Patient / Family Education for ongoing care 1 15 []  - 0 Complex (extensive) Patient / Family Education for ongoing care X- 1 10 Staff obtains Programmer, systems, Records, T Results / Process Orders est []  - 0 Staff telephones HHA, Nursing Homes / Clarify orders / etc []  - 0 Routine Transfer to another Facility (non-emergent condition) []  - 0 Routine Hospital Admission (non-emergent condition) []  - 0 New Admissions / Biomedical engineer / Ordering NPWT Apligraf, etc. , []  - 0 Emergency Hospital Admission (emergent condition) X- 1 10 Simple Discharge Coordination []  - 0 Complex (extensive) Discharge Coordination PROCESS - Special Needs []  - 0 Pediatric / Minor Patient Management []  - 0 Isolation Patient Management []  - 0 Hearing / Language / Visual special needs []  - 0 Assessment of Community assistance (transportation, D/C planning, etc.) []  - 0 Additional assistance / Altered  mentation []  - 0 Support Surface(s) Assessment (bed, cushion, seat, etc.) INTERVENTIONS - Wound Cleansing / Measurement X - Simple Wound Cleansing - one wound 1  5 []  - 0 Complex Wound Cleansing - multiple wounds []  - 0 Wound Imaging (photographs - any number of wounds) []  - 0 Wound Tracing (instead of photographs) X- 1 5 Simple Wound Measurement - one wound []  - 0 Complex Wound Measurement - multiple wounds INTERVENTIONS - Wound Dressings X - Small Wound Dressing one or multiple wounds 1 10 []  - 0 Medium Wound Dressing one or multiple wounds []  - 0 Large Wound Dressing one or multiple wounds []  - 0 Application of Medications - topical []  - 0 Application of Medications - injection INTERVENTIONS - Miscellaneous []  - 0 External ear exam []  - 0 Specimen Collection (cultures, biopsies, blood, body fluids, etc.) []  - 0 Specimen(s) / Culture(s) sent or taken to Lab for analysis []  - 0 Patient Transfer (multiple staff / Civil Service fast streamer / Similar devices) []  - 0 Simple Staple / Suture removal (25 or less) []  - 0 Complex Staple / Suture removal (26 or more) []  - 0 Hypo / Hyperglycemic Management (close monitor of Blood Glucose) []  - 0 Ankle / Brachial Index (ABI) - do not check if billed separately X- 1 5 Vital Signs Has the patient been seen at the hospital within the last three years: Yes Total Score: 100 Level Of Care: New/Established - Level 3 Electronic Signature(s) Signed: 03/08/2021 5:30:47 PM By: Deon Pilling Entered By: Deon Pilling on 03/08/2021 11:46:08 -------------------------------------------------------------------------------- Encounter Discharge Information Details Patient Name: [name]: [name] [name] 03/08/2021 9:15 A M Medical Record Number: [id number] Patient Account Number: [id number] Date of Birth/Sex: Treating RN: 1940/12/01 (80 y.o. [name] Primary Care Takari Duncombe: [name] Other Clinician: Referring Dene Nazir: Treating  Loring Liskey/Extender: Alean Rinne Weeks in Treatment: 4 Encounter Discharge Information Items Discharge Condition: Stable Ambulatory Status: Ambulatory Discharge Destination: Home Transportation: Private Auto Accompanied By: self Schedule Follow-up Appointment: Yes Clinical Summary of Care: Electronic Signature(s) Signed: 03/08/2021 5:30:47 PM By: Deon Pilling Entered By: Deon Pilling on 03/08/2021 11:45:47 -------------------------------------------------------------------------------- Patient/Caregiver Education Details Patient Name: [name]: [name] [name] 4/29/2022andnbsp9:15 A M Medical Record Number: [id number] Patient Account Number: [id number] Date of Birth/Gender: Treating RN: 1940/11/25 (80 y.o. [name] Primary Care Physician: [name] Other Clinician: Referring Physician: Treating Physician/Extender: Alean Rinne Weeks in Treatment: 4 Education Assessment Education Provided To: Patient Education Topics Provided Pain: Handouts: A Guide to Pain Control Methods: Explain/Verbal Responses: Reinforcements needed Electronic Signature(s) Signed: 03/08/2021 5:30:47 PM By: Deon Pilling Entered By: Deon Pilling on 03/08/2021 11:45:38 -------------------------------------------------------------------------------- Wound Assessment Details Patient Name: [name]: [name] [name] 03/08/2021 9:15 A M Medical Record Number: [id number] Patient Account Number: [id number] Date of Birth/Sex: Treating RN: 07-Feb-1941 (80 y.o. 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 Peri-Wound Care Topical Primary Dressing  Hydrofera Blue Classic Foam, 2x2 in Discharge Instruction: Moisten with saline prior to applying to wound bed Secondary Dressing Woven Gauze Sponges 2x2 in Discharge Instruction: Apply over primary dressing as directed. Secured With Conforming Stretch Gauze Bandage, Sterile 2x75 (in/in) Discharge Instruction: Secure with stretch gauze as directed. Compression Wrap Compression Stockings Add-Ons Electronic Signature(s) Signed: 03/08/2021 4:35:52 PM By: Sandre Kitty Signed: 03/08/2021 5:13:52 PM By: Lorrin Jackson Entered By: Sandre Kitty on 03/08/2021 09:56:47 -------------------------------------------------------------------------------- Vitals Details Patient Name: [name]: [name] [name] 03/08/2021 9:15 A M Medical Record Number: [id number] Patient Account Number: [id number] Date of Birth/Sex: Treating RN: [birth year] (80 y.o. [name] Primary Care Marissia Blackham: [name] Other Clinician: Referring Jamisha Hoeschen: Treating Mariena Meares/Extender: Alean Rinne Weeks in Treatment: 4 Vital Signs Time Taken: 09:56 Temperature (F): 98.0 Height (in): 69 Pulse (bpm): 87 Weight (lbs): 250 Respiratory Rate (breaths/min): 18 Body Mass Index (BMI): 36.9 Blood Pressure (mmHg): 150/73 Reference Range: 80 - 120 mg / dl Electronic Signature(s) Signed: 03/08/2021 4:35:52 PM By: Sandre Kitty Entered By: Sandre Kitty on 03/08/2021 09:56:39

## 2021-03-12 ENCOUNTER — Encounter (HOSPITAL_BASED_OUTPATIENT_CLINIC_OR_DEPARTMENT_OTHER): Payer: Medicare Other | Attending: Internal Medicine | Admitting: Internal Medicine

## 2021-03-12 ENCOUNTER — Other Ambulatory Visit: Payer: Self-pay

## 2021-03-12 DIAGNOSIS — M48062 Spinal stenosis, lumbar region with neurogenic claudication: Secondary | ICD-10-CM | POA: Diagnosis not present

## 2021-03-12 DIAGNOSIS — M86171 Other acute osteomyelitis, right ankle and foot: Secondary | ICD-10-CM | POA: Diagnosis not present

## 2021-03-12 DIAGNOSIS — L97518 Non-pressure chronic ulcer of other part of right foot with other specified severity: Secondary | ICD-10-CM | POA: Diagnosis not present

## 2021-03-12 NOTE — Progress Notes (Signed)
[name], [name] ([id number]) Visit Report for 03/12/2021 SuperBill Details Patient Name: [name]: [name], [name] [name] 03/12/2021 Medical Record Number: [id number] Patient Account Number: [id number] Date of Birth/Sex: Treating RN: [birth year] (80 y.o. 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

## 2021-03-13 ENCOUNTER — Other Ambulatory Visit: Payer: Self-pay

## 2021-03-13 ENCOUNTER — Ambulatory Visit
Admission: RE | Admit: 2021-03-13 | Discharge: 2021-03-13 | Disposition: A | Discharge status: Home / Self Care | Payer: Medicare Other | Source: Ambulatory Visit | Attending: Internal Medicine | Admitting: Internal Medicine

## 2021-03-13 DIAGNOSIS — S91101A Unspecified open wound of right great toe without damage to nail, initial encounter: Secondary | ICD-10-CM | POA: Diagnosis not present

## 2021-03-13 DIAGNOSIS — L97518 Non-pressure chronic ulcer of other part of right foot with other specified severity: Secondary | ICD-10-CM

## 2021-03-13 MED ORDER — GADOBENATE DIMEGLUMINE 529 MG/ML IV SOLN
20.0000 mL | Freq: Once | INTRAVENOUS | Status: AC | PRN
  Administered 2021-03-13: 20 mL via INTRAVENOUS

## 2021-03-14 ENCOUNTER — Encounter (HOSPITAL_BASED_OUTPATIENT_CLINIC_OR_DEPARTMENT_OTHER): Payer: Medicare Other | Admitting: Internal Medicine

## 2021-03-14 DIAGNOSIS — L97512 Non-pressure chronic ulcer of other part of right foot with fat layer exposed: Secondary | ICD-10-CM | POA: Diagnosis not present

## 2021-03-14 DIAGNOSIS — M86171 Other acute osteomyelitis, right ankle and foot: Secondary | ICD-10-CM | POA: Diagnosis not present

## 2021-03-14 DIAGNOSIS — L97518 Non-pressure chronic ulcer of other part of right foot with other specified severity: Secondary | ICD-10-CM | POA: Diagnosis not present

## 2021-03-14 DIAGNOSIS — M48062 Spinal stenosis, lumbar region with neurogenic claudication: Secondary | ICD-10-CM | POA: Diagnosis not present

## 2021-03-14 NOTE — Progress Notes (Signed)
[name], [name] ([id number]) Visit Report for 03/14/2021 Arrival Information Details Patient Name: [name]: [name], [name] [name] 03/14/2021 9:00 A M Medical Record Number: [id number] Patient Account Number: [id number] Date of Birth/Sex: Treating RN: 1940-11-23 (80 y.o. [name] Primary Care Naseem Adler: [name] Other Clinician: Referring Jerzy Roepke: Treating Bricyn Labrada/Extender: [name] Weeks in Treatment: 5 Visit Information History Since Last Visit All ordered tests and consults were completed: Yes Patient Arrived: Ambulatory Added or deleted any medications: No Arrival Time: 09:43 Any new allergies or adverse reactions: No Transfer Assistance: None Had a fall or experienced change in No Patient Identification Verified: Yes activities of daily living that may affect Secondary Verification Process Completed: Yes risk of falls: Patient Requires Transmission-Based Precautions: No Signs or symptoms of abuse/neglect since last visito No Patient Has Alerts: Yes Hospitalized since last visit: No Patient Alerts: R ABI non compressible Implantable device outside of the clinic excluding No cellular tissue based products placed in the center since last visit: Has Dressing in Place as Prescribed: Yes Pain Present Now: No Electronic Signature(s) Signed: 03/14/2021 5:52:28 PM By: Lorrin Jackson Entered By: Lorrin Jackson on 03/14/2021 09:43:55 -------------------------------------------------------------------------------- Clinic Level of Care Assessment Details Patient Name: [name]: [name], [name] [name] 03/14/2021 9:00 A M Medical Record Number: [id number] Patient Account Number: [id number] Date of Birth/Sex: Treating RN: 1941-08-16 (80 y.o. [name], [name] Primary Care Deyani Hegarty: [name] Other Clinician: Referring Westin Knotts: Treating Christan Ciccarelli/Extender: [name] Weeks in Treatment: 5 Clinic Level of Care  Assessment Items TOOL 4 Quantity Score X- 1 0 Use when only an EandM is performed on FOLLOW-UP visit ASSESSMENTS - Nursing Assessment / Reassessment X- 1 10 Reassessment of Co-morbidities (includes updates in patient status) X- 1 5 Reassessment of Adherence to Treatment Plan ASSESSMENTS - Wound and Skin A ssessment / Reassessment X - Simple Wound Assessment / Reassessment - one wound 1 5 _0  - 0 Complex Wound Assessment / Reassessment - multiple wounds X- 1 10 Dermatologic / Skin Assessment (not related to wound area) ASSESSMENTS - Focused Assessment X- 1 5 Circumferential Edema Measurements - multi extremities X- 1 10 Nutritional Assessment / Counseling / Intervention _1  - 0 Lower Extremity Assessment (monofilament, tuning fork, pulses) _2  - 0 Peripheral Arterial Disease Assessment (using hand held doppler) ASSESSMENTS - Ostomy and/or Continence Assessment and Care _3  - 0 Incontinence Assessment and Management _4  - 0 Ostomy Care Assessment and Management (repouching, etc.) PROCESS - Coordination of Care X - Simple Patient / Family Education for ongoing care 1 15 _5  - 0 Complex (extensive) Patient / Family Education for ongoing care X- 1 10 Staff obtains Programmer, systems, Records, T Results / Process Orders est _6  - 0 Staff telephones HHA, Nursing Homes / Clarify orders / etc _7  - 0 Routine Transfer to another Facility (non-emergent condition) _8  - 0 Routine Hospital Admission (non-emergent condition) _9  - 0 New Admissions / Biomedical engineer / Ordering NPWT Apligraf, etc. , _10  - 0 Emergency Hospital Admission (emergent condition) X- 1 10 Simple Discharge Coordination _11  - 0 Complex (extensive) Discharge Coordination PROCESS - Special Needs _12  - 0 Pediatric / Minor Patient Management _13  - 0 Isolation Patient Management _14  - 0 Hearing / Language / Visual special needs _15  - 0 Assessment of Community assistance (transportation, D/C planning, etc.) _16  -  0 Additional assistance / Altered mentation _17  - 0 Support Surface(s) Assessment (bed, cushion, seat, etc.) INTERVENTIONS - Wound Cleansing / Measurement X - Simple Wound  Cleansing - one wound 1 5 _0  - 0 Complex Wound Cleansing - multiple wounds X- 1 5 Wound Imaging (photographs - any number of wounds) _1  - 0 Wound Tracing (instead of photographs) X- 1 5 Simple Wound Measurement - one wound _2  - 0 Complex Wound Measurement - multiple wounds INTERVENTIONS - Wound Dressings X - Small Wound Dressing one or multiple wounds 1 10 _3  - 0 Medium Wound Dressing one or multiple wounds _4  - 0 Large Wound Dressing one or multiple wounds <[id number]>_2<\/TFTDDUKGURKYHCWC>_3  - 0 Application of Medications - topical <JSEGBTDVVOHYWVPX>_1<\/GGYIRSWNIOEVOJJK>_0  - 0 Application of Medications - injection INTERVENTIONS - Miscellaneous _7  - 0 External ear exam _8  - 0 Specimen Collection (cultures, biopsies, blood, body fluids, etc.) _9  - 0 Specimen(s) / Culture(s) sent or taken to Lab for analysis _10  - 0 Patient Transfer (multiple staff / Civil Service fast streamer / Similar devices) _11  - 0 Simple Staple / Suture removal (25 or less) _12  - 0 Complex Staple / Suture removal (26 or more) _13  - 0 Hypo / Hyperglycemic Management (close monitor of Blood Glucose) _14  - 0 Ankle / Brachial Index (ABI) - do not check if billed separately X- 1 5 Vital Signs Has the patient been seen at the hospital within the last three years: Yes Total Score: 110 Level Of Care: New/Established - Level 3 Electronic Signature(s) Signed: 03/14/2021 6:21:36 PM By: Deon Pilling Entered By: Deon Pilling on 03/14/2021 10:18:43 -------------------------------------------------------------------------------- Lower Extremity Assessment Details Patient Name: [name]: [name] Banker [name] 03/14/2021 9:00 A M Medical Record Number: [id number] Patient Account Number: [id number] Date of Birth/Sex: Treating RN: Jun 26, 1941 (80 y.o. [name] Primary Care Rahil Passey: [name] Other  Clinician: Referring Andreus Cure: Treating Chyanna Flock/Extender: [name] Weeks in Treatment: 5 Edema Assessment Assessed: [name]: No] [name]: Yes] Edema: [Left: Ye] [Right: s] Calf Left: Right: Point of Measurement: 33 cm From Medial Instep 37.2 cm Ankle Left: Right: Point of Measurement: 10 cm From Medial Instep 22.5 cm Vascular Assessment Pulses: Dorsalis Pedis Palpable: [Right:Yes] Electronic Signature(s) Signed: 03/14/2021 5:52:28 PM By: Lorrin Jackson Entered By: Lorrin Jackson on 03/14/2021 09:50:50 -------------------------------------------------------------------------------- Multi Wound Chart Details Patient Name: [name]: [name] Banker [name] 03/14/2021 9:00 A M Medical Record Number: [id number] Patient Account Number: [id number] Date of Birth/Sex: Treating RN: 15-Feb-1941 (80 y.o. 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] [N/A:N/A] Primary Etiology: [1:Cataracts, Chronic sinus] [N/A:N/A] Comorbid History: [1:problems/congestion, Asthma, Sleep Apnea, Hypertension, Peripheral Venous Disease, Osteoarthritis 01/10/2020] [N/A:N/A] Date Acquired: [1:5] [N/A:N/A] Weeks of Treatment: [1:Open] [N/A:N/A] Wound Status: [1:0.9x1.1x0.2] [N/A:N/A] Measurements L x W x D (cm) [1:0.778] [N/A:N/A] A (cm) : rea [1:0.156] [N/A:N/A] Volume (cm) : [1:17.40%] [N/A:N/A] % Reduction in Area: [1:17.00%] [N/A:N/A] % Reduction in Volume: [1:Full Thickness Without Exposed] [N/A:N/A] Classification: [1:Support  Structures Medium] [N/A:N/A] Exudate Amount: [1:Serosanguineous] [N/A:N/A] Exudate Type: [1:red, brown] [N/A:N/A] Exudate Color: [1:Well defined, not attached] [N/A:N/A] Wound Margin: [1:Large (67-100%)] [N/A:N/A] Granulation Amount: [1:Pink] [N/A:N/A] Granulation Quality: [1:Small (1-33%)] [N/A:N/A] Necrotic Amount: [1:Fat Layer (Subcutaneous Tissue): Yes N/A] Exposed Structures: [1:Fascia: No Tendon: No Muscle: No Joint: No Bone: No Small (1-33%)] [N/A:N/A] Treatment Notes Electronic Signature(s) Signed: 03/14/2021 5:06:47 PM By: Linton Ham MD Signed:  03/14/2021 6:21:36 PM By: Deon Pilling Entered By: Linton Ham on 03/14/2021 10:20:53 -------------------------------------------------------------------------------- Multi-Disciplinary Care Plan Details Patient Name: [name]: [name] Banker [name] 03/14/2021 9:00 A M Medical Record Number: [id number] Patient Account Number: [id number] Date of Birth/Sex: Treating RN: Mar 02, 1941 (80 y.o. [name], [name] Primary Care Ed Rayson: [name] Other Clinician: Referring Kyanna Mahrt: Treating Jenisha Faison/Extender: [name] Weeks in Treatment: 5 Active Inactive Necrotic Tissue Nursing Diagnoses: Impaired tissue integrity related to necrotic/devitalized tissue Knowledge deficit related to management of necrotic/devitalized tissue Goals: Necrotic/devitalized tissue will be minimized in the wound bed Date Initiated: 02/07/2021 Target Resolution Date: 04/24/2021 Goal Status: Active Patient/caregiver will verbalize understanding of reason and process for debridement of necrotic tissue Date Initiated: 02/07/2021 Target Resolution Date: 03/29/2021 Goal Status: Active Interventions: Assess patient pain level pre-, during and post procedure and prior to discharge Provide education on necrotic tissue and debridement process Treatment Activities: T ordered outside of clinic :  02/07/2021 est Notes: Osteomyelitis Nursing Diagnoses: Infection: osteomyelitis Goals: Diagnostic evaluation for osteomyelitis completed as ordered Date Initiated: 03/14/2021 Target Resolution Date: 04/12/2021 Goal Status: Active Signs and symptoms for osteomyelitis will be recognized and promptly addressed Date Initiated: 03/14/2021 Target Resolution Date: 04/12/2021 Goal Status: Active Interventions: Assess for signs and symptoms of osteomyelitis resolution every visit Provide education on osteomyelitis Screen for HBO Treatment Activities: MRI : 03/13/2021 Systemic antibiotics : 03/14/2021 Notes: Pain, Acute or Chronic Nursing Diagnoses: Pain, acute or chronic: actual or potential Potential alteration in comfort, pain Goals: Patient will verbalize adequate pain control and receive pain control interventions during procedures as needed Date Initiated: 02/07/2021 Target Resolution Date: 03/29/2021 Goal Status: Active Patient/caregiver will verbalize comfort level met Date Initiated: 02/07/2021 Date Inactivated: 02/28/2021 Target Resolution Date: 03/08/2021 Goal Status: Met Interventions: Complete pain assessment as per visit requirements Provide education on pain management Reposition patient for comfort Treatment Activities: Administer pain control measures as ordered : 02/07/2021 Notes: Electronic Signature(s) Signed: 03/14/2021 6:21:36 PM By: Deon Pilling Entered By: Deon Pilling on 03/14/2021 10:06:34 -------------------------------------------------------------------------------- Pain Assessment Details Patient Name: [name]: [name] Banker [name] 03/14/2021 9:00 A M Medical Record Number: [id number] Patient Account Number: [id number] Date of Birth/Sex: Treating RN: 1940/12/25 (80 y.o. [name] Primary Care Kee Drudge: Other Clinician: [name] Referring Chamille Werntz: Treating Izeah Vossler/Extender: [name] Weeks in Treatment: 5 Active  Problems Location of Pain Severity and Description of Pain Patient Has Paino Yes Site Locations Pain Location: Generalized Pain Rate the pain. Current Pain Level: 7 Character of Pain Describe the Pain: Aching Pain Management and Medication Current Pain Management: Medication: Yes Cold Application: No Rest: Yes Massage: No Activity: No T.E.N.S.: No Heat Application: No Leg drop or elevation: No Is the Current Pain Management Adequate: Inadequate How does your wound impact your activities of daily livingo Sleep: No Bathing: No Appetite: No Relationship With Others: No Bladder Continence: No Emotions: No Bowel Continence: No Work: No Toileting: No Drive: No Dressing: No Hobbies: No Notes Patient having arthritis pain Electronic Signature(s) Signed: 03/14/2021 5:52:28 PM By: Lorrin Jackson Entered By: Lorrin Jackson on 03/14/2021 09:47:51 -------------------------------------------------------------------------------- Patient/Caregiver Education Details Patient Name: [name]: [name] Banker [name] 5/5/2022andnbsp9:00 A M Medical Record Number: [id number] Patient Account Number: [id number] Date of Birth/Gender: Treating RN: 1941-10-26 (80 y.o. [name] Primary Care Physician: [name] Other Clinician: Referring Physician: Treating Physician/Extender: Leonard Downing in Treatment: 5 Education Assessment Education Provided To: Patient Education Topics Provided Wound  Debridement: Handouts: Wound Debridement Methods: Explain/Verbal Responses: Reinforcements needed Electronic Signature(s) Signed: 03/14/2021 6:21:36 PM By: Deon Pilling Entered By: Deon Pilling on 03/14/2021 09:54:52 -------------------------------------------------------------------------------- Wound Assessment Details Patient Name: [name]: [name] Banker [name] 03/14/2021 9:00 A M Medical Record Number: [id number] Patient Account Number:  [id number] Date of Birth/Sex: Treating RN: Nov 17, 1940 (80 y.o. 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 Great oe Wound Open Wounding Event: Other Lesion Status: Date Acquired: 01/10/2020 Comorbid Cataracts, Chronic sinus problems/congestion, Asthma, Sleep Weeks Of Treatment: 5 History: Apnea, Hypertension, Peripheral Venous Disease, Osteoarthritis Clustered Wound: No Photos Wound Measurements Length: (cm) 0.9 Width: (cm) 1.1 Depth: (cm) 0.2 Area: (cm) 0.778 Volume: (cm) 0.156 % Reduction in Area: 17.4% % Reduction in Volume: 17% Epithelialization: Small (1-33%) Tunneling: No Undermining: No Wound Description Classification: Full Thickness Without Exposed Support Structures Wound Margin: Well defined, not attached Exudate Amount: Medium Exudate Type: Serosanguineous Exudate Color: red, brown Foul Odor After Cleansing: No Slough/Fibrino Yes Wound Bed Granulation Amount: Large (67-100%) Exposed Structure Granulation Quality: Pink Fascia Exposed: No Necrotic Amount: Small (1-33%) Fat Layer (Subcutaneous Tissue) Exposed: Yes Necrotic Quality: Adherent Slough Tendon Exposed: No Muscle Exposed: No Joint Exposed: No Bone Exposed: No Electronic Signature(s) Signed: 03/14/2021 5:12:45 PM By: Sandre Kitty Signed: 03/14/2021 5:52:28 PM By: Lorrin Jackson Entered By: Sandre Kitty on 03/14/2021 16:34:12 -------------------------------------------------------------------------------- Vitals Details Patient Name: [name]: [name] Banker [name] 03/14/2021 9:00 A M Medical Record Number: [id number] Patient Account Number: [id number] Date of Birth/Sex: Treating RN: [birth year] (80 y.o. [name] Primary Care [name] [name]:  [name] Other Clinician: Referring Irja Wheless: Treating Aashika Carta/Extender: [name] Weeks in Treatment: 5 Vital Signs Time Taken: 09:43 Temperature (F): 98.2 Height (in): 69 Pulse (bpm): 73 Weight (lbs): 250 Respiratory Rate (breaths/min): 20 Body Mass Index (BMI): 36.9 Blood Pressure (mmHg): 149/67 Reference Range: 80 - 120 mg / dl Electronic Signature(s) Signed: 03/14/2021 5:52:28 PM By: Lorrin Jackson Entered By: Lorrin Jackson on 03/14/2021 09:47:16

## 2021-03-14 NOTE — Progress Notes (Signed)
[name], Hanson ([id number]) Visit Report for 03/14/2021 HPI Details Patient Name: [name]: [name], [name] [name] 03/14/2021 9:00 A M Medical Record Number: [id number] Patient Account Number: [id number] Date of Birth/Sex: Treating RN: Dec 01, 1940 (80 y.o. [name] Primary Care Provider: Leitha Hanson Other Clinician: Referring Provider: Treating Provider/Extender: [name] Weeks in Treatment: 5 History of Present Illness HPI Description: ADMISSION 02/07/2021 This is a 80 year old man who is not a diabetic "prediabetic" who comes in today letter asking [place] to look at an area on his right first toe tip that has been there about a year. He has been getting this dressed that [employer] and he had Xeroform on this with gauze. The patient is not exactly sure how this came about but he is not able to dress the wound himself because of limitations due to what sounds like spinal stenosis and pain. He had a wound on the ankle as well but that healed over. He was seen by vascular in May 2021. This was related to a right great toe wound. He had an angiogram that was completely normal on both sides good perfusion right into the feet. He also looks like he has lymphedema and he has compression stockings but he cannot get them on and off so he essentially leaves he is on even when he showering Past medical history includes prediabetes, low back pain secondary I think the lumbar spinal stenosis has been offered a decompressive laminectomy but he has not gone forth with this. 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 ABI was noncompressible in our clinic on the right right first toe. 4/7; patient I admitted the clinic last week. He has an area on the tip of his right first toe. We cleaned this off last week and have been using silver collagen. He is in the same crocs today that he wore last week and I told him not  to use. For 1 reason or another he could not handle a surgical sandal. 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 He is not a diabetic 4/12; right first toe wound chronic. We have been using silver collagen. Once again he comes in then the same pair of old crocs that he comes in every week. 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 His x-ray suggested suspicious for osteomyelitis with bone irregularity at the tuft of the right great toe 4/21; patient has been using silver collagen every other day to the wound on his right great toe. He continues to wear crocs. He has no complaints or issues today. 80/5; 2-week follow-up. 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 Mild soft tissue edema with enhancement at the distal aspect of the great toe suggestive of cellulitis. Fortunately the wound actually looks better. Has been using Mudlogger) Signed: 03/14/2021 5:06:47 PM By: Linton Ham MD Entered By: Linton Ham on 03/14/2021 10:21:58 -------------------------------------------------------------------------------- Physical Exam Details Patient Name: [name]: [name] [name] 03/14/2021 9:00 A M Medical Record Number: [id number] Patient Account Number: [id number] Date of Birth/Sex: Treating RN: Feb 01, 1941 (80 y.o. [name] Primary Care Provider: Leitha Hanson Other Clinician: Referring Provider: Treating Provider/Extender: [name] Weeks in Treatment: 5 Constitutional Patient is hypertensive.. Pulse regular and within  target range for patient.[name] Respirations regular, non-labored and within target range.. Temperature is normal and within the target range for the patient.[name] Appears in no distress. Notes Wound exam; tip of his right great toe. The patient has decent looking granulation tissue there  is no probing area here. This is not currently close to bone Electronic Signature(s) Signed: 03/14/2021 5:06:47 PM By: Linton Ham MD Entered By: Linton Ham on 03/14/2021 10:25:22 -------------------------------------------------------------------------------- Physician Orders Details Patient Name: [name]: [name] [name] 03/14/2021 9:00 A M Medical Record Number: [id number] Patient Account Number: [id number] Date of Birth/Sex: Treating RN: 1941/03/08 (80 y.o. 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 - Thursday Dr. Dellia Nims Return A Nurse Visit: - Tuesday 03/19/2021 Bathing/ Shower/ Hygiene May shower with protection but do not get wound dressing(s) wet. - use a cast protector. Edema Control - Lymphedema / SCD / Other Elevate legs to the level of the heart or above for 30 minutes daily and/or when sitting, a frequency of: - throughout the day. Avoid standing for long periods of time. Exercise regularly Moisturize legs daily. - both legs every night before bed. Off-Loading Open toe surgical shoe to: - wound center clinic to provide surgical shoe to patient to wear while walking and standing to right foot. Other: - Stop wearing the Croc slip on shoes. 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 Ensure no pressure of on the tip right great toe. Additional Orders / Instructions Other: - Pick up oral antibiotics at pharmacy today. Wound Treatment Wound #1 - T Great oe Wound Laterality: Right Cleanser: Wound Cleanser 2 x Per Week Discharge  Instructions: Cleanse the wound with wound cleanser prior to applying a clean dressing using gauze sponges, not tissue or cotton balls. Prim Dressing: Hydrofera Blue Classic Foam, 2x2 in 2 x Per Week ary Discharge Instructions: Moisten with saline prior to applying to wound bed Secondary Dressing: Woven Gauze Sponges 2x2 in 2 x Per Week Discharge Instructions: Apply over primary dressing as directed. Secured With: Child psychotherapist, Sterile 2x75 (in/in) 2 x Per Week Discharge Instructions: Secure with stretch gauze as directed. Patient Medications llergies: No Known Allergies A Notifications Medication Indication Start End acute osteo rigtht first 03/14/2021 Augmentin toe DOSE oral 875 mg-125 mg tablet - 1tablet oral bid for 2 weeks Electronic Signature(s) Signed: 03/14/2021 10:29:07 AM By: Linton Ham MD Entered By: Linton Ham on 03/14/2021 10:29:06 -------------------------------------------------------------------------------- Problem List Details Patient Name: [name]: [name] [name] 03/14/2021 9:00 A M Medical Record Number: [id number] Patient Account Number: [id number] Date of Birth/Sex: Treating RN: 26-Aug-1941 (80 y.o. 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 03/14/2021 9:00 A M Medical Record Number: [id number] Patient Account Number: [id number] Date of Birth/Sex: Treating RN: 06-Nov-1941 (80 y.o. [name] Primary Care Provider: Leitha Hanson Other Clinician: Referring Provider: Treating Provider/Extender: [name] Weeks in Treatment: 5 Subjective History of Present Illness (HPI) ADMISSION 02/07/2021 This is a 80 year old man who is not a diabetic "prediabetic" who comes in today letter asking [place] to look at an area on his right first toe tip that has been there about a year. He has been getting this dressed that [employer] and he had Xeroform on this with gauze. The patient is not exactly sure how this came about but he is not able to dress the wound himself because of limitations due to what sounds like spinal stenosis and pain. He had a wound on the ankle as well but that healed over. He was seen by vascular in May 2021. This was related to a right great toe wound. He had an angiogram that was completely normal on both sides good perfusion right into the feet. He also looks like he has lymphedema and he has compression stockings but he cannot get them on and off so he essentially leaves he is on even when he showering Past medical history includes prediabetes, low back pain secondary I think the lumbar spinal stenosis has been offered a decompressive laminectomy but he has not gone forth with this. 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 ABI was noncompressible in our clinic on the right right first toe. 4/7; patient I admitted the clinic last week. He has an area on the tip of his right first toe. We cleaned this off last week and have been using silver collagen. He is in the same crocs  today that he wore last week and I told him not to use. For 1 reason or another he could not handle a surgical sandal. 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 He is not a diabetic 4/12; right first toe wound chronic. We have been using silver collagen. Once again he comes in then the same pair of old crocs that he comes in every week. 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 His x-ray suggested suspicious for osteomyelitis with bone irregularity at the tuft of the right great toe 4/21; patient has been using silver collagen every other day to the wound on his right great toe. He continues to wear crocs. He has no complaints or issues today. 80/5; 2-week follow-up. 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 Mild soft tissue edema with enhancement at the distal aspect of the great toe suggestive of cellulitis. Fortunately the wound actually looks better. Has been using Hydrofera Blue Objective Constitutional Patient is hypertensive.. Pulse regular and within target range for patient.[name] Respirations regular, non-labored and within target range.. Temperature is normal and within the target range for the patient.[name] Appears in no distress. Vitals Time Taken: 9:43 AM, Height: 69 in, Weight: 250 lbs, BMI: 36.9, Temperature: 98.2 F, Pulse: 73 bpm, Respiratory Rate: 20 breaths/min, Blood Pressure: 149/67 mmHg. General Notes: Wound exam; tip of his right great toe. The patient has decent looking granulation tissue there is no probing area here. This is not currently close  to bone Integumentary (Hair, Skin) Wound #1 status is Open. Original cause of wound was Other Lesion. The date acquired was: 01/10/2020. The wound has been in treatment 5 weeks. The wound is located on the Right T Great. The wound measures 0.9cm length x 1.1cm width x 0.2cm depth; 0.778cm^2 area and 0.156cm^3  volume. There is Fat Layer oe (Subcutaneous Tissue) exposed. There is no tunneling or undermining noted. There is a medium amount of serosanguineous drainage noted. The wound margin is well defined and not attached to the wound base. There is large (67-100%) pink granulation within the wound bed. There is a small (1-33%) amount of necrotic tissue within the wound bed including Adherent Slough. 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 - Thursday Dr. Leanord Hawking Nurse Visit: - Tuesday 03/19/2021 Bathing/ Shower/ Hygiene: May shower with protection but do not get wound dressing(s) wet. - use a cast protector. Edema Control - Lymphedema / SCD / Other: Elevate legs to the level of the heart or above for 30 minutes daily and/or when sitting, a frequency of: - throughout the day. Avoid standing for long periods of time. Exercise regularly Moisturize legs daily. - both legs every night before bed. Off-Loading: Open toe surgical shoe to: - wound center clinic to provide surgical shoe to patient to wear while walking and standing to right foot. Other: - Stop wearing the Croc slip on shoes. 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 Ensure no pressure of on the tip right great toe. Additional Orders / Instructions: Other: - Pick up oral antibiotics at pharmacy today. The following medication(s) was prescribed: Augmentin oral 875 mg-125 mg tablet 1tablet oral bid for 2 weeks for acute osteo rigtht first toe starting 03/14/2021 WOUND #1: - T Great Wound Laterality: Right oe Cleanser: Wound Cleanser 2 x Per Week/ Discharge Instructions: Cleanse the wound with wound cleanser prior to applying a clean dressing using gauze sponges, not tissue or cotton  balls. Prim Dressing: Hydrofera Blue Classic Foam, 2x2 in 2 x Per Week/ ary Discharge Instructions: Moisten with saline prior to applying to wound bed Secondary Dressing: Woven Gauze Sponges 2x2 in 2 x Per Week/ Discharge Instructions: Apply over primary dressing as directed. Secured With: Insurance underwriter, Sterile 2x75 (in/in) 2 x Per Week/ Discharge Instructions: Secure with stretch gauze as directed. #1 I am continue with Hydrofera Blue 2. I am going to give him a prolonged course of empiric Augmentin. I discussed the risk benefits of using oral antibiotics versus IV and using antibiotics at all versus an amputation of the distal tuft of the toe. He is very reluctant to consider any prolonged offloading of this toe which has been problematic in the past. 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 Fortunately things are improving 4. I am going to give him a prolonged course of Augmentin. He does have chronic renal failure which has been stable for a number of years 875 twice daily for 2 weeks at some point in time I am going to check his BUN/creatinine and inflammatory markers probably next week Electronic Signature(s) Signed: 03/14/2021 5:06:47 PM By: Baltazar Najjar MD Entered By: Baltazar Najjar on 03/14/2021 10:29:47 -------------------------------------------------------------------------------- SuperBill Details Patient Name: [name]: [name] [name]  03/14/2021 Medical Record Number: [id number] Patient Account Number: [id number] Date of Birth/Sex: Treating RN: 02-03-1941 (80 y.o. Tammy Sours Primary Care Provider: [name] Other Clinician: Referring Provider: Treating Provider/Extender: Jerelene Redden Weeks in Treatment: 5 Diagnosis Coding ICD-10 Codes Code Description [phone] Non-pressure chronic ulcer of other part of right foot with other specified severity M48.062 Spinal  stenosis, lumbar region with neurogenic claudication M86.071 Acute hematogenous osteomyelitis, right ankle and foot Facility Procedures CPT4 Code: [id number] Description: 99213 - WOUND CARE VISIT-LEV 3 EST PT Modifier: Quantity: 1 Physician Procedures : CPT4 Code Description Modifier [id number] - WC PHYS LEVEL 4 - EST PT ICD-10 Diagnosis Description L97.518 Non-pressure chronic ulcer of other part of right foot with other specified severity M86.071 Acute hematogenous osteomyelitis, right ankle and  foot Quantity: 1 Electronic Signature(s) Signed: 03/14/2021 5:06:47 PM By: Baltazar Najjar MD Entered By: Baltazar Najjar on 03/14/2021 10:29:55

## 2021-03-15 NOTE — Progress Notes (Signed)
[name], [name] ([id number]) Visit Report for 03/12/2021 Arrival Information Details Patient Name: [name]: [name], [name]. 03/12/2021 9:15 A M Medical Record Number: [id number] Patient Account Number: [id number] Date of Birth/Sex: Treating RN: 09/26/1941 (80 y.o. [name], [name] Primary Care Jannifer Fischler: [name] Other Clinician: Referring Lamari Youngers: Treating Valeriano Bain/Extender: [name] Weeks in Treatment: 4 Visit Information History Since Last Visit Added or deleted any medications: No Patient Arrived: Ambulatory Any new allergies or adverse reactions: No Arrival Time: 09:38 Had a fall or experienced change in No Accompanied By: self activities of daily living that may affect Transfer Assistance: None risk of falls: Patient Identification Verified: Yes Signs or symptoms of abuse/neglect since last visito No Secondary Verification Process Completed: Yes Hospitalized since last visit: No Patient Requires Transmission-Based Precautions: No Implantable device outside of the clinic excluding No Patient Has Alerts: Yes cellular tissue based products placed in the center Patient Alerts: R ABI non compressible since last visit: Has Dressing in Place as Prescribed: Yes Pain Present Now: No Electronic Signature(s) Signed: 03/12/2021 10:51:39 AM By: Sandre Kitty Entered By: Sandre Kitty on 03/12/2021 09:39:47 -------------------------------------------------------------------------------- Clinic Level of Care Assessment Details Patient Name: [name]: [name], [name]. 03/12/2021 9:15 A M Medical Record Number: [id number] Patient Account Number: [id number] Date of Birth/Sex: Treating RN: [birth year] (80 y.o. [name] Primary Care Odalis Jordan: [name] Other Clinician: Referring Jahvier Aldea: Treating Jahkai Yandell/Extender: [name] Weeks in Treatment: 4 Clinic Level of Care Assessment Items TOOL 4  Quantity Score X- 1 0 Use when only an EandM is performed on FOLLOW-UP visit ASSESSMENTS - Nursing Assessment / Reassessment []  - 0 Reassessment of Co-morbidities (includes updates in patient status) []  - 0 Reassessment of Adherence to Treatment Plan ASSESSMENTS - Wound and Skin A ssessment / Reassessment X - Simple Wound Assessment / Reassessment - one wound 1 5 []  - 0 Complex Wound Assessment / Reassessment - multiple wounds []  - 0 Dermatologic / Skin Assessment (not related to wound area) ASSESSMENTS - Focused Assessment []  - 0 Circumferential Edema Measurements - multi extremities []  - 0 Nutritional Assessment / Counseling / Intervention []  - 0 Lower Extremity Assessment (monofilament, tuning fork, pulses) []  - 0 Peripheral Arterial Disease Assessment (using hand held doppler) ASSESSMENTS - Ostomy and/or Continence Assessment and Care []  - 0 Incontinence Assessment and Management []  - 0 Ostomy Care Assessment and Management (repouching, etc.) PROCESS - Coordination of Care []  - 0 Simple Patient / Family Education for ongoing care X- 1 20 Complex (extensive) Patient / Family Education for ongoing care []  - 0 Staff obtains Programmer, systems, Records, T Results / Process Orders est []  - 0 Staff telephones HHA, Nursing Homes / Clarify orders / etc []  - 0 Routine Transfer to another Facility (non-emergent condition) []  - 0 Routine Hospital Admission (non-emergent condition) []  - 0 New Admissions / Biomedical engineer / Ordering NPWT Apligraf, etc. , []  - 0 Emergency Hospital Admission (emergent condition) []  - 0 Simple Discharge Coordination []  - 0 Complex (extensive) Discharge Coordination PROCESS - Special Needs []  - 0 Pediatric / Minor Patient Management []  - 0 Isolation Patient Management []  - 0 Hearing / Language / Visual special needs []  - 0 Assessment of Community assistance (transportation, D/C planning, etc.) []  - 0 Additional assistance / Altered  mentation []  - 0 Support Surface(s) Assessment (bed, cushion, seat, etc.) INTERVENTIONS - Wound Cleansing / Measurement X - Simple Wound Cleansing - one wound 1 5 []  -  0 Complex Wound Cleansing - multiple wounds []  - 0 Wound Imaging (photographs - any number of wounds) []  - 0 Wound Tracing (instead of photographs) X- 1 5 Simple Wound Measurement - one wound []  - 0 Complex Wound Measurement - multiple wounds INTERVENTIONS - Wound Dressings X - Small Wound Dressing one or multiple wounds 1 10 []  - 0 Medium Wound Dressing one or multiple wounds []  - 0 Large Wound Dressing one or multiple wounds []  - 0 Application of Medications - topical []  - 0 Application of Medications - injection INTERVENTIONS - Miscellaneous []  - 0 External ear exam []  - 0 Specimen Collection (cultures, biopsies, blood, body fluids, etc.) []  - 0 Specimen(s) / Culture(s) sent or taken to Lab for analysis []  - 0 Patient Transfer (multiple staff / Civil Service fast streamer / Similar devices) []  - 0 Simple Staple / Suture removal (25 or less) []  - 0 Complex Staple / Suture removal (26 or more) []  - 0 Hypo / Hyperglycemic Management (close monitor of Blood Glucose) []  - 0 Ankle / Brachial Index (ABI) - do not check if billed separately X- 1 5 Vital Signs Has the patient been seen at the hospital within the last three years: Yes Total Score: 50 Level Of Care: New/Established - Level 2 Electronic Signature(s) Signed: 03/12/2021 4:38:34 PM By: Lorrin Jackson Entered By: Lorrin Jackson on 03/12/2021 10:25:47 -------------------------------------------------------------------------------- Encounter Discharge Information Details Patient Name: [name]: [name]. 03/12/2021 9:15 A M Medical Record Number: [id number] Patient Account Number: [id number] Date of Birth/Sex: Treating RN: 11-12-1940 (80 y.o. [name] Primary Care Shelley Cocke: [name] Other Clinician: Referring Marletta Bousquet: Treating  Jabari Swoveland/Extender: [name] Weeks in Treatment: 4 Encounter Discharge Information Items Discharge Condition: Stable Ambulatory Status: Ambulatory Discharge Destination: Home Transportation: Private Auto Schedule Follow-up Appointment: Yes Clinical Summary of Care: Provided on 03/12/2021 Form Type Recipient Paper Patient Patient Electronic Signature(s) Signed: 03/12/2021 10:26:37 AM By: Lorrin Jackson Entered By: Lorrin Jackson on 03/12/2021 10:26:37 -------------------------------------------------------------------------------- Patient/Caregiver Education Details Patient Name: [name]: [name]. 5/3/2022andnbsp9:15 A M Medical Record Number: [id number] Patient Account Number: [id number] Date of Birth/Gender: Treating RN: [birth year] (79 y.o. [name] Primary Care Physician: [name] Other Clinician: Referring Physician: Treating Physician/Extender: [name] in Treatment: 4 Education Assessment Education Provided To: Patient Education Topics Provided Offloading: Methods: Explain/Verbal Responses: State content correctly Wound/Skin Impairment: Methods: Explain/Verbal Responses: State content correctly Electronic Signature(s) Signed: 03/12/2021 4:38:34 PM By: Lorrin Jackson Entered By: Lorrin Jackson on 03/12/2021 10:26:22 -------------------------------------------------------------------------------- Wound Assessment Details Patient Name: [name]: [name]. 03/12/2021 9:15 A M Medical Record Number: [id number] Patient Account Number: [id number] Date of Birth/Sex: Treating RN: 1941-07-16 (80 y.o. 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 Great oe Wound Status: Open Wounding Event: Other Lesion Date Acquired: 01/10/2020 Weeks Of Treatment: 4 Clustered Wound: No Wound Measurements Length: (cm) 0.9 Width: (cm) 1.1 Depth: (cm) 0.2 Area: (cm) 0.778 Volume: (cm) 0.156 % Reduction in Area: 17.4% % Reduction in Volume: 17% Wound Description Classification: Full Thickness Without Exposed Support Structur es Electronic Signature(s) Signed: 03/12/2021 10:51:39 AM By: Sandre Kitty Signed: 03/15/2021 3:14:58 PM By: Rhae Hammock RN Entered By: Sandre Kitty on 03/12/2021 09:40:15 -------------------------------------------------------------------------------- Vitals Details Patient Name: [name]: Claudean Kinds,  RICHA RD Hanson. 03/12/2021 9:15 A M Medical Record Number: [id number] Patient Account Number: [id number] Date of Birth/Sex: Treating RN: [birth year] (80 y.o. [name], [name] Primary Care Kirsty Monjaraz: [name] Other Clinician: Referring Jenney Brester: Treating Allin Frix/Extender: [name] Weeks in Treatment: 4 Vital Signs Time Taken: 09:39 Temperature (F): 97.4 Height (in): 69 Pulse (bpm): 78 Weight (lbs): 250 Respiratory Rate (breaths/min): 18 Body Mass Index (BMI): 36.9 Blood Pressure (mmHg): 151/81 Reference Range: 80 - 120 mg / dl Electronic Signature(s) Signed: 03/12/2021 10:51:39 AM By: Sandre Kitty Entered By: Sandre Kitty on 03/12/2021 09:40:06

## 2021-03-15 NOTE — Progress Notes (Signed)
ULYSESS, WITZ ([id number]) Visit Report for 02/26/2021 SuperBill Details Patient Name: [name]: [name], [name] [name] 02/26/2021 Medical Record Number: [id number] Patient Account Number: [id number] Date of Birth/Sex: Treating RN: 1941-02-11 (80 y.o. 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

## 2021-03-18 ENCOUNTER — Other Ambulatory Visit: Payer: Self-pay

## 2021-03-18 ENCOUNTER — Ambulatory Visit (INDEPENDENT_AMBULATORY_CARE_PROVIDER_SITE_OTHER): Payer: Medicare Other | Admitting: Podiatry

## 2021-03-18 ENCOUNTER — Ambulatory Visit (INDEPENDENT_AMBULATORY_CARE_PROVIDER_SITE_OTHER): Payer: Medicare Other

## 2021-03-18 DIAGNOSIS — M19072 Primary osteoarthritis, left ankle and foot: Secondary | ICD-10-CM

## 2021-03-18 DIAGNOSIS — M778 Other enthesopathies, not elsewhere classified: Secondary | ICD-10-CM

## 2021-03-18 DIAGNOSIS — S9032XA Contusion of left foot, initial encounter: Secondary | ICD-10-CM

## 2021-03-19 NOTE — Progress Notes (Signed)
MAYUR, DUMAN ([id number]) Visit Report for 03/05/2021 SuperBill Details Patient Name: [name]: [name], [name]. 03/05/2021 Medical Record Number: [id number] Patient Account Number: [id number] Date of Birth/Sex: Treating RN: 17-Jul-1941 (80 y.o. 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

## 2021-03-21 ENCOUNTER — Encounter (HOSPITAL_BASED_OUTPATIENT_CLINIC_OR_DEPARTMENT_OTHER): Payer: Medicare Other | Admitting: Internal Medicine

## 2021-03-21 ENCOUNTER — Other Ambulatory Visit: Payer: Self-pay

## 2021-03-21 DIAGNOSIS — M86171 Other acute osteomyelitis, right ankle and foot: Secondary | ICD-10-CM | POA: Diagnosis not present

## 2021-03-21 DIAGNOSIS — L97512 Non-pressure chronic ulcer of other part of right foot with fat layer exposed: Secondary | ICD-10-CM | POA: Diagnosis not present

## 2021-03-21 DIAGNOSIS — L97518 Non-pressure chronic ulcer of other part of right foot with other specified severity: Secondary | ICD-10-CM | POA: Diagnosis not present

## 2021-03-21 DIAGNOSIS — L988 Other specified disorders of the skin and subcutaneous tissue: Secondary | ICD-10-CM | POA: Diagnosis not present

## 2021-03-21 DIAGNOSIS — M48062 Spinal stenosis, lumbar region with neurogenic claudication: Secondary | ICD-10-CM | POA: Diagnosis not present

## 2021-03-22 NOTE — Progress Notes (Signed)
[name], [name] ([id number]) Visit Report for 03/08/2021 SuperBill Details Patient Name: [name]: [name], [name] 03/08/2021 Medical Record Number: [id number] Patient Account Number: [id number] Date of Birth/Sex: Treating RN: 1941-07-25 (80 y.o. 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

## 2021-03-25 ENCOUNTER — Ambulatory Visit: Payer: Medicare Other | Admitting: Podiatry

## 2021-03-25 DIAGNOSIS — J45901 Unspecified asthma with (acute) exacerbation: Secondary | ICD-10-CM | POA: Insufficient documentation

## 2021-03-25 DIAGNOSIS — F5101 Primary insomnia: Secondary | ICD-10-CM | POA: Insufficient documentation

## 2021-03-25 DIAGNOSIS — N401 Enlarged prostate with lower urinary tract symptoms: Secondary | ICD-10-CM | POA: Insufficient documentation

## 2021-03-25 DIAGNOSIS — N1832 Chronic kidney disease, stage 3b: Secondary | ICD-10-CM | POA: Insufficient documentation

## 2021-03-25 DIAGNOSIS — J4520 Mild intermittent asthma, uncomplicated: Secondary | ICD-10-CM | POA: Insufficient documentation

## 2021-03-25 DIAGNOSIS — N183 Chronic kidney disease, stage 3 unspecified: Secondary | ICD-10-CM | POA: Insufficient documentation

## 2021-03-25 DIAGNOSIS — J4530 Mild persistent asthma, uncomplicated: Secondary | ICD-10-CM | POA: Insufficient documentation

## 2021-03-25 DIAGNOSIS — F411 Generalized anxiety disorder: Secondary | ICD-10-CM | POA: Insufficient documentation

## 2021-03-25 DIAGNOSIS — G479 Sleep disorder, unspecified: Secondary | ICD-10-CM | POA: Insufficient documentation

## 2021-03-25 DIAGNOSIS — G8929 Other chronic pain: Secondary | ICD-10-CM | POA: Insufficient documentation

## 2021-03-25 DIAGNOSIS — R5382 Chronic fatigue, unspecified: Secondary | ICD-10-CM | POA: Insufficient documentation

## 2021-03-25 DIAGNOSIS — J301 Allergic rhinitis due to pollen: Secondary | ICD-10-CM | POA: Insufficient documentation

## 2021-03-25 DIAGNOSIS — E785 Hyperlipidemia, unspecified: Secondary | ICD-10-CM | POA: Insufficient documentation

## 2021-03-25 DIAGNOSIS — R7303 Prediabetes: Secondary | ICD-10-CM | POA: Insufficient documentation

## 2021-03-25 DIAGNOSIS — L97511 Non-pressure chronic ulcer of other part of right foot limited to breakdown of skin: Secondary | ICD-10-CM | POA: Insufficient documentation

## 2021-03-25 DIAGNOSIS — F331 Major depressive disorder, recurrent, moderate: Secondary | ICD-10-CM | POA: Insufficient documentation

## 2021-03-25 DIAGNOSIS — R609 Edema, unspecified: Secondary | ICD-10-CM | POA: Insufficient documentation

## 2021-03-25 DIAGNOSIS — E669 Obesity, unspecified: Secondary | ICD-10-CM | POA: Insufficient documentation

## 2021-03-25 DIAGNOSIS — G9332 Myalgic encephalomyelitis/chronic fatigue syndrome: Secondary | ICD-10-CM | POA: Insufficient documentation

## 2021-03-25 DIAGNOSIS — K219 Gastro-esophageal reflux disease without esophagitis: Secondary | ICD-10-CM | POA: Insufficient documentation

## 2021-03-25 NOTE — Progress Notes (Signed)
SIR, MALLIS ([id number]) Visit Report for 03/21/2021 Arrival Information Details Patient Name: [name]: LESTAT, GOLOB [name] 03/21/2021 8:45 A M Medical Record Number: [id number] Patient Account Number: [id number] Date of Birth/Sex: Treating RN: Nov 05, 1941 (80 y.o. [name] Primary Care Leilynn Pilat: [name] Other Clinician: Referring Torry Adamczak: Treating Maccoy Haubner/Extender: [name] Weeks in Treatment: 6 Visit Information History Since Last Visit Added or deleted any medications: No Patient Arrived: Ambulatory Any new allergies or adverse reactions: No Arrival Time: 09:30 Had a fall or experienced change in No Transfer Assistance: None activities of daily living that may affect Patient Identification Verified: Yes risk of falls: Secondary Verification Process Completed: Yes Signs or symptoms of abuse/neglect since last visito No Patient Requires Transmission-Based Precautions: No Hospitalized since last visit: No Patient Has Alerts: Yes Implantable device outside of the clinic excluding No Patient Alerts: R ABI non compressible cellular tissue based products placed in the center since last visit: Has Dressing in Place as Prescribed: Yes Pain Present Now: No Electronic Signature(s) Signed: 03/21/2021 4:36:36 PM By: Lorrin Jackson Entered By: Lorrin Jackson on 03/21/2021 09:33:15 -------------------------------------------------------------------------------- Clinic Level of Care Assessment Details Patient Name: [name]: ANGELL, HONSE [name] 03/21/2021 8:45 A M Medical Record Number: [id number] Patient Account Number: [id number] Date of Birth/Sex: Treating RN: Jul 27, 1941 (80 y.o. [name], [name] Primary Care Kyngston Pickelsimer: [name] Other Clinician: Referring Vahe Pienta: Treating Tynell Winchell/Extender: [name] Weeks in Treatment: 6 Clinic Level of Care Assessment Items TOOL 4 Quantity Score X- 1  0 Use when only an EandM is performed on FOLLOW-UP visit ASSESSMENTS - Nursing Assessment / Reassessment X- 1 10 Reassessment of Co-morbidities (includes updates in patient status) X- 1 5 Reassessment of Adherence to Treatment Plan ASSESSMENTS - Wound and Skin A ssessment / Reassessment X - Simple Wound Assessment / Reassessment - one wound 1 5 _0  - 0 Complex Wound Assessment / Reassessment - multiple wounds X- 1 10 Dermatologic / Skin Assessment (not related to wound area) ASSESSMENTS - Focused Assessment X- 1 5 Circumferential Edema Measurements - multi extremities X- 1 10 Nutritional Assessment / Counseling / Intervention X- 1 5 Lower Extremity Assessment (monofilament, tuning fork, pulses) _1  - 0 Peripheral Arterial Disease Assessment (using hand held doppler) ASSESSMENTS - Ostomy and/or Continence Assessment and Care _2  - 0 Incontinence Assessment and Management _3  - 0 Ostomy Care Assessment and Management (repouching, etc.) PROCESS - Coordination of Care X - Simple Patient / Family Education for ongoing care 1 15 _4  - 0 Complex (extensive) Patient / Family Education for ongoing care X- 1 10 Staff obtains Programmer, systems, Records, T Results / Process Orders est _5  - 0 Staff telephones HHA, Nursing Homes / Clarify orders / etc _6  - 0 Routine Transfer to another Facility (non-emergent condition) _7  - 0 Routine Hospital Admission (non-emergent condition) _8  - 0 New Admissions / Biomedical engineer / Ordering NPWT Apligraf, etc. , _9  - 0 Emergency Hospital Admission (emergent condition) X- 1 10 Simple Discharge Coordination _10  - 0 Complex (extensive) Discharge Coordination PROCESS - Special Needs _11  - 0 Pediatric / Minor Patient Management _12  - 0 Isolation Patient Management _13  - 0 Hearing / Language / Visual special needs _14  - 0 Assessment of Community assistance (transportation, D/C planning, etc.) _15  - 0 Additional assistance / Altered mentation _16  -  0 Support Surface(s) Assessment (bed, cushion, seat, etc.) INTERVENTIONS - Wound Cleansing / Measurement X - Simple Wound Cleansing - one wound 1 5 _17  -  0 Complex Wound Cleansing - multiple wounds X- 1 5 Wound Imaging (photographs - any number of wounds) _0  - 0 Wound Tracing (instead of photographs) X- 1 5 Simple Wound Measurement - one wound _1  - 0 Complex Wound Measurement - multiple wounds INTERVENTIONS - Wound Dressings X - Small Wound Dressing one or multiple wounds 1 10 _2  - 0 Medium Wound Dressing one or multiple wounds _3  - 0 Large Wound Dressing one or multiple wounds X- 1 5 Application of Medications - topical <NLGXQJJHERDEYCXK>_4<\/YJEHUDJSHFWYOVZC>_5  - 0 Application of Medications - injection INTERVENTIONS - Miscellaneous _5  - 0 External ear exam _6  - 0 Specimen Collection (cultures, biopsies, blood, body fluids, etc.) _7  - 0 Specimen(s) / Culture(s) sent or taken to Lab for analysis _8  - 0 Patient Transfer (multiple staff / Civil Service fast streamer / Similar devices) _9  - 0 Simple Staple / Suture removal (25 or less) _10  - 0 Complex Staple / Suture removal (26 or more) _11  - 0 Hypo / Hyperglycemic Management (close monitor of Blood Glucose) _12  - 0 Ankle / Brachial Index (ABI) - do not check if billed separately X- 1 5 Vital Signs Has the patient been seen at the hospital within the last three years: Yes Total Score: 120 Level Of Care: New/Established - Level 4 Electronic Signature(s) Signed: 03/25/2021 6:08:57 PM By: Rhae Hammock RN Entered By: Rhae Hammock on 03/21/2021 09:53:51 -------------------------------------------------------------------------------- Encounter Discharge Information Details [name] 03/21/2021 8:45 A M Medical Record Number: [id number] Patient Account Number: [id number] Date of Birth/Sex: Treating RN: Jun 16, 1941 (80 y.o. Ernestene Mention Primary Care Ameriah Lint: [name] Other Clinician: Referring Issaih Kaus: Treating  Leul Narramore/Extender: [name] Weeks in Treatment: 6 Encounter Discharge Information Items Discharge Condition: Stable Ambulatory Status: Ambulatory Discharge Destination: Home Transportation: Private Auto Accompanied By: self Schedule Follow-up Appointment: Yes Clinical Summary of Care: Patient Declined Electronic Signature(s) Signed: 03/25/2021 4:50:33 PM By: Baruch Gouty RN, BSN Entered By: Baruch Gouty on 03/21/2021 10:13:44 -------------------------------------------------------------------------------- Lower Extremity Assessment Details [name] 03/21/2021 8:45 A M Medical Record Number: [id number] Patient Account Number: [id number] Date of Birth/Sex: Treating RN: Dec 22, 1940 (80 y.o. [name] Primary Care Wray Goehring: [name] Other Clinician: Referring Gerson Fauth: Treating Ignacia Gentzler/Extender: [name] Weeks in Treatment: 6 Edema Assessment Assessed: [name]: No] [name]: Yes] Edema: [Left: Ye] [Right: s] Calf Left: Right: Point of Measurement: 33 cm From Medial Instep 37 cm Ankle Left: Right: Point of Measurement: 10 cm From Medial Instep 23 cm Vascular Assessment Pulses: Dorsalis Pedis Palpable: [Right:Yes] Electronic Signature(s) Signed: 03/21/2021 4:36:36 PM By: Lorrin Jackson Entered By: Lorrin Jackson on 03/21/2021 09:36:40 -------------------------------------------------------------------------------- Multi Wound Chart Details [name] 03/21/2021 8:45 A M Medical Record Number: [id number] Patient Account Number: [id number] Date of Birth/Sex: Treating RN: 1941/08/27 (80 y.o. 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] [N/A:N/A] Primary Etiology: [1:Cataracts, Chronic sinus] [N/A:N/A] Comorbid History: [1:problems/congestion, Asthma, Sleep Apnea, Hypertension, Peripheral Venous Disease, Osteoarthritis 01/10/2020] [N/A:N/A] Date Acquired: [1:6] [N/A:N/A] Weeks of Treatment: [1:Open] [N/A:N/A] Wound Status: [1:0.8x0.7x0.2] [N/A:N/A] Measurements L x W x D (cm) [1:0.44] [  N/A:N/A] A (cm) : rea [1:0.088] [N/A:N/A] Volume (cm) : [1:53.30%] [N/A:N/A] % Reduction in Area: [1:53.20%] [N/A:N/A] % Reduction in Volume: [1:Full Thickness Without Exposed] [N/A:N/A] Classification: [1:Support Structures Medium] [N/A:N/A] Exudate Amount: [1:Serosanguineous] [N/A:N/A] Exudate Type: [1:red, brown] [N/A:N/A] Exudate Color: [1:Well defined, not attached] [N/A:N/A] Wound Margin: [1:Large (67-100%)] [N/A:N/A] Granulation Amount: [1:Pink, Pale] [N/A:N/A] Granulation Quality: [1:Small (1-33%)] [N/A:N/A] Necrotic Amount: [1:Fat Layer (Subcutaneous Tissue): Yes N/A] Exposed Structures: [1:Fascia: No Tendon: No Muscle: No Joint: No Bone: No Small (1-33%)] [N/A:N/A] Treatment Notes Electronic Signature(s) Signed: 03/21/2021 3:44:17 PM By: Linton Ham MD Signed: 03/25/2021 6:08:57 PM By: Rhae Hammock RN Entered By: Linton Ham on 03/21/2021 09:55:05 -------------------------------------------------------------------------------- Multi-Disciplinary Care Plan Details [name] 03/21/2021 8:45 A M Medical Record Number: [id number] Patient Account Number: [id number] Date of Birth/Sex: Treating RN: 1941-08-10 (80 y.o. [name], [name] Primary Care Liston Thum: [name] Other Clinician: Referring  Dereka Lueras: Treating Stonewall Doss/Extender: [name] Weeks in Treatment: 6 Active Inactive Necrotic Tissue Nursing Diagnoses: Impaired tissue integrity related to necrotic/devitalized tissue Knowledge deficit related to management of necrotic/devitalized tissue Goals: Necrotic/devitalized tissue will be minimized in the wound bed Date Initiated: 02/07/2021 Target Resolution Date: 04/24/2021 Goal Status: Active Patient/caregiver will verbalize understanding of reason and process for debridement of necrotic tissue Date Initiated: 02/07/2021 Target Resolution Date: 04/05/2021 Goal Status: Active Interventions: Assess patient pain level pre-, during and post procedure and prior to discharge Provide education on necrotic tissue and debridement process Treatment Activities: T ordered outside of clinic : 02/07/2021 est Notes: Osteomyelitis Nursing Diagnoses: Infection: osteomyelitis Goals: Diagnostic evaluation for osteomyelitis completed as ordered Date Initiated: 03/14/2021 Target Resolution Date: 04/12/2021 Goal Status: Active Signs and symptoms for osteomyelitis will be recognized and promptly addressed Date Initiated: 03/14/2021 Target Resolution Date: 04/12/2021 Goal Status: Active Interventions: Assess for signs and symptoms of osteomyelitis resolution every visit Provide education on osteomyelitis Screen for HBO Treatment Activities: MRI : 03/13/2021 Systemic antibiotics : 03/14/2021 Notes: Pain, Acute or Chronic Nursing Diagnoses: Pain, acute or chronic: actual or potential Potential alteration in comfort, pain Goals: Patient will verbalize adequate pain control and receive pain control interventions during procedures as needed Date Initiated: 02/07/2021 Target Resolution Date: 04/06/2021 Goal Status: Active Patient/caregiver will verbalize comfort level met Date Initiated: 02/07/2021 Date Inactivated: 02/28/2021 Target Resolution Date: 03/08/2021 Goal Status:  Met Interventions: Complete pain assessment as per visit requirements Provide education on pain management Reposition patient for comfort Treatment Activities: Administer pain control measures as ordered : 02/07/2021 Notes: Electronic Signature(s) Signed: 03/25/2021 6:08:57 PM By: Rhae Hammock RN Entered By: Rhae Hammock on 03/21/2021 07:51:09 -------------------------------------------------------------------------------- Pain Assessment Details [name] 03/21/2021 8:45 A M Medical Record Number: [id number] Patient Account Number: [id number] Date of Birth/Sex: Treating RN: May 01, 1941 (80 y.o. [name] Primary Care Javarius Tsosie: [name] Other Clinician: Referring Finola Rosal: Treating Rea Kalama/Extender: [name] Weeks in Treatment: 6 Active Problems Location of Pain Severity and Description of Pain Patient Has Paino No Site Locations Pain Management and Medication Current Pain Management: Electronic Signature(s) Signed: 03/21/2021 4:36:36 PM By: Lorrin Jackson Entered By: Lorrin Jackson on 03/21/2021 09:35:46 -------------------------------------------------------------------------------- Patient/Caregiver Education Details [name] 5/12/2022andnbsp8:45 A M Medical Record Number: [id number] Patient Account Number: [id number] Date of Birth/Gender: Treating RN: Aug 14, 1941 (80 y.o. [name] Primary Care Physician: [name] Other Clinician: Referring Physician: Treating Physician/Extender: [name] Weeks in  Treatment: 6 Education Assessment Education Provided To: Patient Education Topics Provided Infection: Methods: Explain/Verbal Responses: Reinforcements needed Pain: Methods: Explain/Verbal Responses: Reinforcements needed Electronic Signature(s) Signed: 03/25/2021 6:08:57 PM By: Rhae Hammock  RN Entered By: Rhae Hammock on 03/21/2021 07:51:47 -------------------------------------------------------------------------------- Wound Assessment Details [name] 03/21/2021 8:45 A M Medical Record Number: [id number] Patient Account Number: [id number] Date of Birth/Sex: Treating RN: 1941-10-06 (80 y.o. 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 Wound Open Wounding Event: Other Lesion Status: Date Acquired: 01/10/2020 Comorbid Cataracts, Chronic sinus problems/congestion, Asthma, Sleep Weeks Of Treatment: 6 History: Apnea, Hypertension, Peripheral Venous Disease, Osteoarthritis Clustered Wound: No Photos Wound Measurements Length: (cm) 0.8 Width: (cm) 0.7 Depth: (cm) 0.2 Area: (cm) 0.44 Volume: (cm) 0.088 Wound Description Classification: Full Thickness Without Exposed Support Structu Wound Margin: Well defined, not attached Exudate Amount: Medium Exudate Type: Serosanguineous Exudate Color: red, brown Foul Odor After Cleansing: Slough/Fibrino % Reduction in Area: 53.3% % Reduction in Volume: 53.2% Epithelialization: Small (1-33%) Tunneling: No Undermining: No res No Yes Wound Bed Granulation Amount: Large (67-100%) Exposed Structure Granulation Quality: Pink, Pale Fascia Exposed: No Necrotic Amount: Small (1-33%) Fat Layer (Subcutaneous Tissue) Exposed: Yes Necrotic Quality: Adherent Slough Tendon Exposed: No Muscle Exposed: No Joint Exposed: No Bone Exposed: No Treatment Notes Wound #1 (Toe Great) Wound Laterality: Right Cleanser Wound Cleanser Discharge Instruction: Cleanse the wound with wound cleanser prior to applying a clean dressing using gauze sponges,  not tissue or cotton balls. Peri-Wound Care Topical Primary Dressing Hydrofera Blue Classic Foam, 2x2 in Discharge Instruction: Moisten with saline prior to applying to wound bed Secondary Dressing Woven Gauze Sponges 2x2 in Discharge Instruction: Apply over primary dressing as directed. Secured With Conforming Stretch Gauze Bandage, Sterile 2x75 (in/in) Discharge Instruction: Secure with stretch gauze as directed. Compression Wrap Compression Stockings Add-Ons Electronic Signature(s) Signed: 03/21/2021 3:37:13 PM By: Sandre Kitty Signed: 03/21/2021 4:36:36 PM By: Lorrin Jackson Entered By: Sandre Kitty on 03/21/2021 15:03:12 -------------------------------------------------------------------------------- Vitals Details [name] 03/21/2021 8:45 A M Medical Record Number: [id number] Patient Account Number: [id number] Date of Birth/Sex: Treating RN: 04-11-1941 (81 y.o. [name] Primary Care Niah Heinle: [name] Other Clinician: Referring Montoya Brandel: Treating Login Muckleroy/Extender: [name] Weeks in Treatment: 6 Vital Signs Time Taken: 09:33 Temperature (F): 97.6 Height (in): 69 Pulse (bpm): 62 Weight (lbs): 250 Respiratory Rate (breaths/min): 20 Body Mass Index (BMI): 36.9 Blood Pressure (mmHg): 152/66 Reference Range: 80 - 120 mg / dl Electronic Signature(s) Signed: 03/21/2021 4:36:36 PM By: Lorrin Jackson Entered By: Lorrin Jackson on 03/21/2021 09:35:39

## 2021-03-25 NOTE — Progress Notes (Signed)
[name], [name] ([id number]) Visit Report for 03/21/2021 HPI Details Patient Name: [name]: Hanson, [name] [name] 03/21/2021 8:45 A M Medical Record Number: [id number] Patient Account Number: [id number] Date of Birth/Sex: Treating RN: 1941/04/19 (80 y.o. Burnadette Hanson, [name] Primary Care Provider: [name] Other Clinician: Referring Provider: Treating Provider/Extender: [name] Weeks in Treatment: 6 History of Present Illness HPI Description: ADMISSION 02/07/2021 This is a 80 year old man who is not a diabetic "prediabetic" who comes in today letter asking [place] to look at an area on his right first toe tip that has been there about a year. He has been getting this dressed that [employer] and he had Xeroform on this with gauze. The patient is not exactly sure how this came about but he is not able to dress the wound himself because of limitations due to what sounds like spinal stenosis and pain. He had a wound on the ankle as well but that healed over. He was seen by vascular in May 2021. This was related to a right great toe wound. He had an angiogram that was completely normal on both sides good perfusion right into the feet. He also looks like he has lymphedema and he has compression stockings but he cannot get them on and off so he essentially leaves he is on even when he showering Past medical history includes prediabetes, low back pain secondary I think the lumbar spinal stenosis has been offered a decompressive laminectomy but he has not gone forth with this. 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 ABI was noncompressible in our clinic on the right right first toe. 4/7; patient I admitted the clinic last week. He has an area on the tip of his right first toe. We cleaned this off last week and have been using silver collagen. He is in the same crocs today that he wore last week and I told him  not to use. For 1 reason or another he could not handle a surgical sandal. 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 He is not a diabetic 4/12; right first toe wound chronic. We have been using silver collagen. Once again he comes in then the same pair of old crocs that he comes in every week. 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 His x-ray suggested suspicious for osteomyelitis with bone irregularity at the tuft of the right great toe 4/21; patient has been using silver collagen every other day to the wound on his right great toe. He continues to wear crocs. He has no complaints or issues today. 5/5; 2-week follow-up. 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 Mild soft tissue edema with enhancement at the distal aspect of the great toe suggestive of cellulitis. Fortunately the wound actually looks better. Has been using Hydrofera Blue 5/12; started him on Augmentin last week 875 twice daily he is tolerating this well. Last creatinine I see is 1.77 in early April. We are using Hydrofera Blue to the wound which actually looks better today. Electronic Signature(s) Signed: 03/21/2021 3:44:17 PM By: Linton Ham MD Entered By: Linton Ham on 03/21/2021 09:55:52 -------------------------------------------------------------------------------- Physical Exam Details Patient Name: [name]: [name] [name] 03/21/2021 8:45 A M Medical Record Number: [id number] Patient Account Number: [id number] Date of Birth/Sex:  Treating RN: Nov 05, 1941 (80 y.o. Hanson [name] Primary Care Provider: [name] Other Clinician: Referring Provider: Treating Provider/Extender: [name] Weeks in Treatment: 6 Constitutional Patient is hypertensive.. Pulse regular and within target range for patient.[name] Respirations regular, non-labored and  within target range.. Temperature is normal and within the target range for the patient.[name] Appears in no distress. Notes Wound exam; tip of the right great toe. This actually looks better. Healthy granulation tissue wound looks like it is filling in there is no surrounding erythema. No tenderness over the interphalangeal joint. Electronic Signature(s) Signed: 03/21/2021 3:44:17 PM By: Linton Ham MD Entered By: Linton Ham on 03/21/2021 09:56:49 -------------------------------------------------------------------------------- Physician Orders Details Patient Name: [name]: [name] [name] 03/21/2021 8:45 A M Medical Record Number: [id number] Patient Account Number: [id number] Date of Birth/Sex: Treating RN: 1941-05-13 (80 y.o. Hanson [name] Primary Care Provider: [name] Other Clinician: Referring Provider: Treating Provider/Extender: [name] Weeks in Treatment: 6 Verbal / Phone Orders: No Diagnosis Coding Follow-up Appointments ppointment in 1 week. - Thursday Dr. Dellia Nims Return A Nurse Visit: - Tuesday 03/19/2021 Bathing/ Shower/ Hygiene May shower with protection but do not get wound dressing(s) wet. - use a cast protector. Edema Control - Lymphedema / SCD / Other Elevate legs to the level of the heart or above for 30 minutes daily and/or when sitting, a frequency of: - throughout the day. Avoid standing for long periods of time. Exercise regularly Moisturize legs daily. - both legs every night before bed. Off-Loading Open toe surgical shoe to: - wound center clinic to provide surgical shoe to patient to wear while walking and standing to right foot. Other: - Stop wearing the Croc slip on shoes. 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 Ensure no pressure of on the tip right great toe. Wound Treatment Wound #1 - T Great oe Wound Laterality: Right Cleanser: Wound Cleanser Every Other Day Discharge  Instructions: Cleanse the wound with wound cleanser prior to applying a clean dressing using gauze sponges, not tissue or cotton balls. Prim Dressing: Hydrofera Blue Classic Foam, 2x2 in Every Other Day ary Discharge Instructions: Moisten with saline prior to applying to wound bed Secondary Dressing: Woven Gauze Sponges 2x2 in Every Other Day Discharge Instructions: Apply over primary dressing as directed. Secured With: Child psychotherapist, Sterile 2x75 (in/in) Every Other Day Discharge Instructions: Secure with stretch gauze as directed. Laboratory CBC W A Differential panel in Blood (HEM-CBC) uto LOINC Code: [phone] Convenience Name: CBC W Auto Differential panel Erythrocyte sedimentation rate (HEM) LOINC Code: [id number] Convenience Name: Sed rate-method unspecified C reactive protein [Mass/volume] in Serum or Plasma (CHEM) LOINC Code: 1988-5 Convenience Name: C Reactive Protein in serum or plasma Basic metabolic 7353 panel in Serum or Plasma (CHEM-panel) LOINC Code: 29924-2 Convenience Name: Basic Metabolic panelCMS Electronic Signature(s) Signed: 03/21/2021 3:44:17 PM By: Linton Ham MD Signed: 03/25/2021 6:08:57 PM By: Rhae Hammock RN Entered By: Rhae Hammock on 03/21/2021 09:57:50 -------------------------------------------------------------------------------- Problem List Details Patient Name: [name]: [name] [name] 03/21/2021 8:45 A M Medical Record Number: [id number] Patient Account Number: [id number] Date of Birth/Sex: Treating RN: June 26, 1941 (79 y.o. Hanson [name] Primary Care Provider: [name] Other Clinician: Referring Provider: Treating Provider/Extender: [name] Weeks in Treatment: 6 Active Problems ICD-10 Encounter Code Description Active Date MDM Diagnosis L97.518 Non-pressure chronic ulcer of other part of right foot with other specified 02/07/2021 No Yes severity M48.062  Spinal  stenosis, lumbar region with neurogenic claudication 02/07/2021 No Yes M86.171 Other acute osteomyelitis, right ankle and foot 03/21/2021 No Yes Inactive Problems Resolved Problems Electronic Signature(s) Signed: 03/21/2021 3:44:17 PM By: Linton Ham MD Entered By: Linton Ham on 03/21/2021 09:59:08 -------------------------------------------------------------------------------- Progress Note Details Patient Name: [name]: [name] [name] 03/21/2021 8:45 A M Medical Record Number: [id number] Patient Account Number: [id number] Date of Birth/Sex: Treating RN: 1941-04-18 (80 y.o. Burnadette Hanson, [name] Primary Care Provider: [name] Other Clinician: Referring Provider: Treating Provider/Extender: [name] Weeks in Treatment: 6 Subjective History of Present Illness (HPI) ADMISSION 02/07/2021 This is a 80 year old man who is not a diabetic "prediabetic" who comes in today letter asking [place] to look at an area on his right first toe tip that has been there about a year. He has been getting this dressed that [employer] and he had Xeroform on this with gauze. The patient is not exactly sure how this came about but he is not able to dress the wound himself because of limitations due to what sounds like spinal stenosis and pain. He had a wound on the ankle as well but that healed over. He was seen by vascular in May 2021. This was related to a right great toe wound. He had an angiogram that was completely normal on both sides good perfusion right into the feet. He also looks like he has lymphedema and he has compression stockings but he cannot get them on and off so he essentially leaves he is on even when he showering Past medical history includes prediabetes, low back pain secondary I think the lumbar spinal stenosis has been offered a decompressive laminectomy but he has not gone forth with this. 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 ABI was noncompressible in our clinic on the right right first toe. 4/7; patient I admitted the clinic last week. He has an area on the tip of his right first toe. We cleaned this off last week and have been using silver collagen. He is in the same crocs today that he wore last week and I told him not to use. For 1 reason or another he could not handle a surgical sandal. 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 He is not a diabetic 4/12; right first toe wound chronic. We have been using silver collagen. Once again he comes in then the same pair of old crocs that he comes in every week. 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 His x-ray suggested suspicious for osteomyelitis with bone irregularity at the tuft of the right great toe 4/21; patient has been using silver collagen every other day to the wound on his right great toe. He continues to wear crocs. He has no complaints or issues today. 5/5; 2-week follow-up. 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 Mild soft tissue edema with enhancement at the distal aspect of the great toe suggestive of cellulitis. Fortunately the wound actually looks better. Has been using Hydrofera Blue 5/12; started him on Augmentin last week 875 twice daily he is tolerating this well. Last creatinine I see is 1.77 in early April. We are using Hydrofera Blue to the wound which actually looks better today. Objective Constitutional Patient is hypertensive.. Pulse regular and  within target range for patient.[name] Respirations regular, non-labored and within target range.. Temperature is normal and within the target range for the patient.[name] Appears in no distress. Vitals Time Taken: 9:33 AM, Height: 69 in, Weight: 250 lbs, BMI: 36.9, Temperature: 97.6 F, Pulse: 62 bpm, Respiratory Rate: 20  breaths/min, Blood Pressure: 152/66 mmHg. General Notes: Wound exam; tip of the right great toe. This actually looks better. Healthy granulation tissue wound looks like it is filling in there is no surrounding erythema. No tenderness over the interphalangeal joint. Integumentary (Hair, Skin) Wound #1 status is Open. Original cause of wound was Other Lesion. The date acquired was: 01/10/2020. The wound has been in treatment 6 weeks. The wound is located on the Right T Great. The wound measures 0.8cm length x 0.7cm width x 0.2cm depth; 0.44cm^2 area and 0.088cm^3 volume. There is Fat Layer oe (Subcutaneous Tissue) exposed. There is no tunneling or undermining noted. There is a medium amount of serosanguineous drainage noted. The wound margin is well defined and not attached to the wound base. There is large (67-100%) pink, pale granulation within the wound bed. There is a small (1-33%) amount of necrotic tissue within the wound bed including Adherent Slough. 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 - Thursday Dr. Dellia Nims Nurse Visit: - Tuesday 03/19/2021 Bathing/ Shower/ Hygiene: May shower with protection but do not get wound dressing(s) wet. - use a cast protector. Edema Control - Lymphedema / SCD / Other: Elevate legs to the level of the heart or above for 30 minutes daily and/or when sitting, a frequency of: - throughout the day. Avoid standing for long periods of time. Exercise regularly Moisturize legs daily. - both legs every night before bed. Off-Loading: Open toe surgical shoe to: - wound center clinic to provide surgical shoe to patient to wear while walking and standing to right foot. Other: - Stop wearing the Croc slip on shoes. When having  to work in Nutritional therapist. Ensure no pressure of on the tip right great toe. Laboratory ordered were: CBC W Auto Differential panel, Sed rate -method unspecified, C Reactive Protein in serum or plasma, Basic Metabolic panel CMS WOUND #1: - T Great Wound Laterality: Right oe Cleanser: Wound Cleanser Every Other Day/ Discharge Instructions: Cleanse the wound with wound cleanser prior to applying a clean dressing using gauze sponges, not tissue or cotton balls. Prim Dressing: Hydrofera Blue Classic Foam, 2x2 in Every Other Day/ ary Discharge Instructions: Moisten with saline prior to applying to wound bed Secondary Dressing: Woven Gauze Sponges 2x2 in Every Other Day/ Discharge Instructions: Apply over primary dressing as directed. Secured With: Child psychotherapist, Sterile 2x75 (in/in) Every Other Day/ Discharge Instructions: Secure with stretch gauze as directed. #1 Hydrofera Blue to continue 2. I will need to renew his Augmentin next week assuming he is tolerating this well. I will give him 2 weeks at that point with 1 renewal 3. Today I am checking a BMP, CBC, sedimentation rate and C-reactive protein. As mentioned he has some degree of chronic renal failure I will probably monitor that 1 time before completion of his antibiotic Electronic Signature(s) Signed: 03/21/2021 9:59:48 AM By: Linton Ham MD Entered By: Linton Ham on 03/21/2021 09:59:47 -------------------------------------------------------------------------------- SuperBill Details Patient Name: [name]: [name] [name] 03/21/2021 Medical Record  Number: [id number] Patient Account Number: [id number] Date of Birth/Sex: Treating RN: [birth year] (80 y.o. Burnadette Hanson, [name] Primary Care Provider: [name] Other Clinician: Referring Provider: Treating Provider/Extender: [name] Weeks in Treatment: 6 Diagnosis Coding ICD-10 Codes Code  Description [phone] Non-pressure chronic ulcer of other part of right foot with other specified severity M48.062 Spinal stenosis, lumbar region with neurogenic claudication M86.171 Other acute osteomyelitis, right ankle and foot Facility Procedures CPT4 Code: [id number] Description: 99214 - WOUND CARE VISIT-LEV 4 EST PT Modifier: Quantity: 1 Physician Procedures : CPT4 Code Description Modifier [id number] - WC PHYS LEVEL 3 - EST PT ICD-10 Diagnosis Description L97.518 Non-pressure chronic ulcer of other part of right foot with other specified severity M86.171 Other acute osteomyelitis, right ankle and foot Quantity: 1 Electronic Signature(s) Signed: 03/21/2021 3:44:17 PM By: Linton Ham MD Entered By: Linton Ham on 03/21/2021 10:00:14

## 2021-03-26 ENCOUNTER — Encounter (HOSPITAL_BASED_OUTPATIENT_CLINIC_OR_DEPARTMENT_OTHER): Payer: Medicare Other | Admitting: Internal Medicine

## 2021-03-26 ENCOUNTER — Other Ambulatory Visit: Payer: Self-pay

## 2021-03-26 DIAGNOSIS — L97512 Non-pressure chronic ulcer of other part of right foot with fat layer exposed: Secondary | ICD-10-CM | POA: Diagnosis not present

## 2021-03-26 DIAGNOSIS — M86171 Other acute osteomyelitis, right ankle and foot: Secondary | ICD-10-CM | POA: Diagnosis not present

## 2021-03-26 DIAGNOSIS — M48062 Spinal stenosis, lumbar region with neurogenic claudication: Secondary | ICD-10-CM | POA: Diagnosis not present

## 2021-03-26 DIAGNOSIS — L97518 Non-pressure chronic ulcer of other part of right foot with other specified severity: Secondary | ICD-10-CM | POA: Diagnosis not present

## 2021-03-26 NOTE — Progress Notes (Signed)
NYHEEM, BINETTE ([id number]) Visit Report for 03/26/2021 HPI Details Patient Name: [name]: [name], [name] 03/26/2021 9:30 A M Medical Record Number: [id number] Patient Account Number: [id number] Date of Birth/Sex: Treating RN: 30-Jul-1941 (80 y.o. [name] Primary Care Provider: [name] Other Clinician: Referring Provider: Treating Provider/Extender: [name] Weeks in Treatment: 6 History of Present Illness HPI Description: ADMISSION 02/07/2021 This is a 80 year old man who is not a diabetic "prediabetic" who comes in today letter asking [place] to look at an area on his right first toe tip that has been there about a year. He has been getting this dressed that [employer] and he had Xeroform on this with gauze. The patient is not exactly sure how this came about but he is not able to dress the wound himself because of limitations due to what sounds like spinal stenosis and pain. He had a wound on the ankle as well but that healed over. He was seen by vascular in May 2021. This was related to a right great toe wound. He had an angiogram that was completely normal on both sides good perfusion right into the feet. He also looks like he has lymphedema and he has compression stockings but he cannot get them on and off so he essentially leaves he is on even when he showering Past medical history includes prediabetes, low back pain secondary I think the lumbar spinal stenosis has been offered a decompressive laminectomy but he has not gone forth with this. 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 ABI was noncompressible in our clinic on the right right first toe. 4/7; patient I admitted the clinic last week. He has an area on the tip of his right first toe. We cleaned this off last week and have been using silver collagen. He is in the same crocs today that he wore last week and I told him not  to use. For 1 reason or another he could not handle a surgical sandal. 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 He is not a diabetic 4/12; right first toe wound chronic. We have been using silver collagen. Once again he comes in then the same pair of old crocs that he comes in every week. 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 His x-ray suggested suspicious for osteomyelitis with bone irregularity at the tuft of the right great toe 4/21; patient has been using silver collagen every other day to the wound on his right great toe. He continues to wear crocs. He has no complaints or issues today. 5/5; 2-week follow-up. 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 Mild soft tissue edema with enhancement at the distal aspect of the great toe suggestive of cellulitis. Fortunately the wound actually looks better. Has been using Hydrofera Blue 5/12; started him on Augmentin last week 875 twice daily he is tolerating this well. Last creatinine I see is 1.77 in early April. We are using Hydrofera Blue to the wound which actually looks better today. 5/17; tolerating the Augmentin well. He says the wound is actually been hurting him episodically. Actually the surface of this looks quite good. He did not get his lab work done. 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 03/26/2021 9:30 A M Medical Record Number: [id number] Patient Account Number: [id number] Date of Birth/Sex: Treating RN: 06-03-1941 (80 y.o. [name]) Deaton, Bobbi Primary Care Provider: Pam DrownMcNeill, Wendy W Other Clinician: Referring  Provider: Treating Provider/Extender: Jerelene Reddenobson, Mirelle Biskup McNeill, Wendy W Weeks in Treatment: 6 Constitutional Patient is hypertensive.. Pulse regular and within target range for patient.[name]. Respirations regular, non-labored and within target range.. Temperature is normal and within the target range for the patient.[name]. Appears in no distress. Notes Wound exam; tip of the right great toe. This actually looks quite healthy. There is no evidence of infection nice epithelialization. No erythema. The wound does not probe to bone Electronic Signature(s) Signed: 03/26/2021 4:16:43 PM By: Baltazar Najjarobson, Cassy Sprowl MD Entered By: Baltazar Najjarobson, Kinslea Frances on 03/26/2021 10:40:59 -------------------------------------------------------------------------------- Physician Orders Details Patient Name: [name]: [name], [name] 03/26/2021 9:30 A M Medical Record Number: [id number] Patient Account Number: [id number] Date of Birth/Sex: Treating RN: 06-03-1941 (80 y.o. Charlean MerlM) Breedlove, [name] Primary Care Provider: Pam DrownMcNeill, Wendy W Other Clinician: Referring Provider: Treating Provider/Extender: Jerelene Reddenobson, Samarion Ehle McNeill, Wendy W Weeks in Treatment: 6 Verbal / Phone Orders: No Diagnosis Coding Follow-up Appointments ppointment in 2 weeks. - Dr. Leanord Hawkingobson on Thursday's!!!! Return A Bathing/ Shower/ Hygiene May shower with protection but do not get wound dressing(s) wet. - use a cast protector. Edema Control - Lymphedema / SCD / Other Elevate legs to the level of the heart or above for 30 minutes daily and/or when sitting, a frequency of: - throughout the day. Avoid standing for long periods of time. Exercise regularly Moisturize legs daily. - both legs every night before bed. Off-Loading Open toe surgical shoe to: - wound center clinic to provide surgical shoe to patient to wear while walking and standing to right foot. Other: - Stop wearing the Croc slip on shoes. 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 Ensure  no pressure of on the tip right great toe. Wound Treatment Wound #1 - T Great oe Wound Laterality: Right Cleanser: Wound Cleanser Every Other Day Discharge Instructions: Cleanse the wound with wound cleanser prior to applying a clean dressing using gauze sponges, not tissue or cotton balls. Prim Dressing: Hydrofera Blue Classic Foam, 2x2 in Every Other Day ary Discharge Instructions: Moisten with saline prior to applying to wound bed Secondary Dressing: Woven Gauze Sponges 2x2 in Every Other Day Discharge Instructions: Apply over primary dressing as directed. Secured With: Insurance underwriterConforming Stretch Gauze Bandage, Sterile 2x75 (in/in) Every Other Day Discharge Instructions: Secure with stretch gauze as directed. Patient Medications llergies: No Known Allergies A Notifications Medication Indication Start End osteomyelitis right first 03/26/2021 Augmentin toe DOSE oral 875 mg-125 mg tablet - 1 tablet oral bid for 14 days (continuing rx) Electronic Signature(s) Signed: 03/26/2021 10:42:47 AM By: Baltazar Najjarobson, Bresha Hosack MD Entered By: Baltazar Najjarobson, Tavio Biegel on 03/26/2021 10:42:46 -------------------------------------------------------------------------------- Problem List Details Patient Name: [name]: [name], [name] 03/26/2021 9:30 A M Medical Record Number: [id number] Patient Account Number: [id number] Date of Birth/Sex: Treating RN: 06-03-1941 (80 y.o. 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 03/26/2021 9:30 A M Medical Record Number: [id number] Patient Account Number: [id number] Date of Birth/Sex: Treating RN: [birth year] (80 y.o. [name] Primary Care Provider: [name] Other Clinician: Referring Provider: Treating Provider/Extender: [name] Weeks in Treatment: 6 Subjective History of Present Illness (HPI) ADMISSION 02/07/2021 This is a 80 year old man who is not a diabetic "prediabetic" who comes in today letter asking [place] to look at an area on his right first toe tip that has been there about a year. He has been getting this dressed that [employer] and he had Xeroform on this with gauze. The patient is not exactly sure how this came about but he is not able to dress the wound himself because of limitations due to what sounds like spinal stenosis and pain. He had a wound on the ankle as well but that healed over. He was seen by vascular in May 2021. This was related to a right great toe wound. He had an angiogram that was completely normal on both sides good perfusion right into the feet. He also looks like he has lymphedema and he has compression stockings but he cannot get them on and off so he essentially leaves he is on even when he showering Past medical history includes prediabetes, low back pain secondary I think the lumbar spinal stenosis has been offered a decompressive laminectomy but he has not gone forth with this. 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 ABI was noncompressible in our clinic on the right right first toe. 4/7; patient I admitted the clinic last  week. He has an area on the tip of his right first toe. We cleaned this off last week and have been using silver collagen. He is in the same crocs today that he wore last week and I told him not to use. For 1 reason or another he could not handle a surgical sandal. 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 He is not a diabetic 4/12; right first toe wound chronic. We have been using silver collagen. Once again he comes in then the same pair of old crocs that he comes in every week. 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 His x-ray suggested suspicious for osteomyelitis with bone irregularity at the tuft of the right great toe 4/21; patient has been using silver collagen every other day to the wound on his right great toe. He continues to wear crocs. He has no complaints or issues today. 5/5; 2-week follow-up. 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 Mild soft tissue edema with enhancement at the distal aspect of the great toe suggestive of cellulitis. Fortunately the wound actually looks better. Has been using Hydrofera Blue 5/12; started him on Augmentin last week 875 twice daily he is tolerating this well. Last creatinine I see is 1.77 in early April. We are using Hydrofera Blue to the wound which actually looks better today. 5/17; tolerating the Augmentin well. He says the wound is actually been hurting him episodically. Actually the surface  of this looks quite good. He did not get his lab work done. We are using Hydrofera Blue he is changing this himself Objective Constitutional Patient is hypertensive.. Pulse regular and within target range for patient.[name] Respirations regular, non-labored and within target range.. Temperature is normal and within the target range for the patient.[name] Appears in no distress. Vitals Time Taken: 10:02 AM, Height: 69 in, Weight: 250  lbs, BMI: 36.9, Temperature: 97.8 F, Pulse: 67 bpm, Respiratory Rate: 20 breaths/min, Blood Pressure: 169/73 mmHg. General Notes: Wound exam; tip of the right great toe. This actually looks quite healthy. There is no evidence of infection nice epithelialization. No erythema. The wound does not probe to bone Integumentary (Hair, Skin) Wound #1 status is Open. Original cause of wound was Other Lesion. The date acquired was: 01/10/2020. The wound has been in treatment 6 weeks. The wound is located on the Right T Great. The wound measures 0.3cm length x 0.2cm width x 0.1cm depth; 0.047cm^2 area and 0.005cm^3 volume. There is Fat Layer oe (Subcutaneous Tissue) exposed. There is no tunneling or undermining noted. There is a medium amount of serosanguineous drainage noted. The wound margin is distinct with the outline attached to the wound base. There is large (67-100%) pink, pale granulation within the wound bed. There is no necrotic tissue within the wound bed. 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 - Dr. Dellia Nims on Thursday's!!!! Bathing/ Shower/ Hygiene: May shower with protection but do not get wound dressing(s) wet. - use a cast protector. Edema Control - Lymphedema / SCD / Other: Elevate legs to the level of the heart or above for 30 minutes daily and/or when sitting, a frequency of: - throughout the day. Avoid standing for long periods of time. Exercise regularly Moisturize legs daily. - both legs every night before bed. Off-Loading: Open toe surgical shoe to: - wound center clinic to provide surgical shoe to patient to wear while walking and standing to right foot. Other: - Stop wearing the Croc slip on shoes. 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 Ensure no pressure of on the tip right great toe. The following medication(s) was prescribed: Augmentin oral 875 mg-125 mg tablet 1 tablet oral bid for 14 days (continuing rx) for osteomyelitis right first toe starting 03/26/2021 WOUND #1: - T Great Wound Laterality: Right oe Cleanser: Wound Cleanser Every Other Day/ Discharge Instructions: Cleanse the wound with wound cleanser prior to applying a clean dressing using gauze sponges, not tissue or cotton balls. Prim Dressing: Hydrofera Blue Classic Foam, 2x2 in Every Other Day/ ary Discharge Instructions: Moisten with saline prior to applying to wound bed Secondary Dressing: Woven Gauze Sponges 2x2 in Every Other Day/ Discharge Instructions: Apply over primary dressing as directed. Secured With: Child psychotherapist, Sterile 2x75 (in/in) Every Other Day/ Discharge Instructions: Secure with stretch gauze as directed. 1. I have continued his Augmentin 875 twice daily for another 2 weeks with 1 renewal this is for the osteomyelitis 2. The wound looks quite good. Using Hydrofera Blue I do not think we need to change this 3. He is complaining of pain episodically although I do not see any evidence of infection here the wound does not probe to bone and at this point. He has the same unkempt foot wear on and I cannot imagine  he is not putting pressure on this area however the wound is improving 4. I have ordered lab work on him including inflammatory markers. 5. If I can get this area to close I am not going to refer him. Continued monitoring will need to be done after this however Electronic Signature(s) Signed: 03/26/2021 4:16:43 PM By: Linton Ham MD Entered By: Linton Ham on 03/26/2021 10:44:12 -------------------------------------------------------------------------------- SuperBill Details Patient Name: [name]: [name] 03/26/2021 Medical Record Number: [id number] Patient Account  Number: [id number] Date of Birth/Sex: Treating RN: [birth year] (80 y.o. 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, lumbar region with neurogenic claudication M86.171 Other acute osteomyelitis, right ankle and foot Facility Procedures CPT4 Code: [id number] Description: 99213 - WOUND CARE VISIT-LEV 3 EST PT Modifier: Quantity: 1 Physician Procedures : CPT4 Code Description Modifier [id number] - WC PHYS LEVEL 4 - EST PT ICD-10 Diagnosis Description L97.518 Non-pressure chronic ulcer of other part of right foot with other specified severity M86.171 Other acute osteomyelitis, right ankle and foot Quantity: 1 Electronic Signature(s) Signed: 03/26/2021 4:16:43 PM By: Linton Ham MD Entered By: Linton Ham on 03/26/2021 10:44:30

## 2021-03-27 ENCOUNTER — Other Ambulatory Visit: Payer: Self-pay | Admitting: Urology

## 2021-03-28 NOTE — Progress Notes (Signed)
[name], Hanson ([id number]) Visit Report for 03/26/2021 Arrival Information Details Patient Name: [name]: [name], Hanson RD Hanson. 03/26/2021 9:30 A M Medical Record Number: [id number] Patient Account Number: [id number] Date of Birth/Sex: Treating RN: [birth year] (81 y.o. [name], [name] Primary Care Bryndan Bilyk: [name] Other Clinician: Referring Timmy Bubeck: Treating Karlei Waldo/Extender: [name] Weeks in Treatment: 6 Visit Information History Since Last Visit Added or deleted any medications: No Patient Arrived: Ambulatory Any new allergies or adverse reactions: No Arrival Time: 10:01 Had a fall or experienced change in No Accompanied By: self activities of daily living that may affect Transfer Assistance: None risk of falls: Patient Identification Verified: Yes Signs or symptoms of abuse/neglect since last visito No Secondary Verification Process Completed: Yes Hospitalized since last visit: No Patient Requires Transmission-Based Precautions: No Implantable device outside of the clinic excluding No Patient Has Alerts: Yes cellular tissue based products placed in the center Patient Alerts: R ABI non compressible since last visit: Has Dressing in Place as Prescribed: Yes Pain Present Now: No Electronic Signature(s) Signed: 03/26/2021 10:08:57 AM By: Sandre Kitty Entered By: Sandre Kitty on 03/26/2021 10:02:57 -------------------------------------------------------------------------------- Clinic Level of Care Assessment Details Patient Name: [name]: [name] RD Hanson. 03/26/2021 9:30 A M Medical Record Number: [id number] Patient Account Number: [id number] Date of Birth/Sex: Treating RN: 03-Feb-1941 (80 y.o. [name], [name] Primary Care Tylar Amborn: [name] Other Clinician: Referring Sharri Loya: Treating Markesha Hannig/Extender: [name] Weeks in Treatment: 6 Clinic Level of Care Assessment Items TOOL  4 Quantity Score X- 1 0 Use when only an EandM is performed on FOLLOW-UP visit ASSESSMENTS - Nursing Assessment / Reassessment X- 1 10 Reassessment of Co-morbidities (includes updates in patient status) X- 1 5 Reassessment of Adherence to Treatment Plan ASSESSMENTS - Wound and Skin A ssessment / Reassessment X - Simple Wound Assessment / Reassessment - one wound 1 5 []  - 0 Complex Wound Assessment / Reassessment - multiple wounds X- 1 10 Dermatologic / Skin Assessment (not related to wound area) ASSESSMENTS - Focused Assessment X- 1 5 Circumferential Edema Measurements - multi extremities []  - 0 Nutritional Assessment / Counseling / Intervention []  - 0 Lower Extremity Assessment (monofilament, tuning fork, pulses) []  - 0 Peripheral Arterial Disease Assessment (using hand held doppler) ASSESSMENTS - Ostomy and/or Continence Assessment and Care []  - 0 Incontinence Assessment and Management []  - 0 Ostomy Care Assessment and Management (repouching, etc.) PROCESS - Coordination of Care X - Simple Patient / Family Education for ongoing care 1 15 []  - 0 Complex (extensive) Patient / Family Education for ongoing care X- 1 10 Staff obtains Programmer, systems, Records, T Results / Process Orders est []  - 0 Staff telephones HHA, Nursing Homes / Clarify orders / etc []  - 0 Routine Transfer to another Facility (non-emergent condition) []  - 0 Routine Hospital Admission (non-emergent condition) []  - 0 New Admissions / Biomedical engineer / Ordering NPWT Apligraf, etc. , []  - 0 Emergency Hospital Admission (emergent condition) X- 1 10 Simple Discharge Coordination []  - 0 Complex (extensive) Discharge Coordination PROCESS - Special Needs []  - 0 Pediatric / Minor Patient Management []  - 0 Isolation Patient Management []  - 0 Hearing / Language / Visual special needs []  - 0 Assessment of Community assistance (transportation, D/C planning, etc.) []  - 0 Additional assistance /  Altered mentation []  - 0 Support Surface(s) Assessment (bed, cushion, seat, etc.) INTERVENTIONS - Wound Cleansing / Measurement X - Simple Wound Cleansing - one wound 1  5 '[]'$  - 0 Complex Wound Cleansing - multiple wounds X- 1 5 Wound Imaging (photographs - any number of wounds) $RemoveBe'[]'PZmYKrzFP$  - 0 Wound Tracing (instead of photographs) X- 1 5 Simple Wound Measurement - one wound $RemoveB'[]'FvVWjMLk$  - 0 Complex Wound Measurement - multiple wounds INTERVENTIONS - Wound Dressings X - Small Wound Dressing one or multiple wounds 1 10 $Re'[]'taW$  - 0 Medium Wound Dressing one or multiple wounds $RemoveBeforeD'[]'MVivqZRoXgYETf$  - 0 Large Wound Dressing one or multiple wounds X- 1 5 Application of Medications - topical $RemoveB'[]'RyBZUxpU$  - 0 Application of Medications - injection INTERVENTIONS - Miscellaneous $RemoveBeforeD'[]'eRiQxxOeiEVUHx$  - 0 External ear exam $Remove'[]'OSNAbNd$  - 0 Specimen Collection (cultures, biopsies, blood, body fluids, etc.) $RemoveBefor'[]'epuDjQzidisN$  - 0 Specimen(s) / Culture(s) sent or taken to Lab for analysis $RemoveBefo'[]'nFRzMEYkuwR$  - 0 Patient Transfer (multiple staff / Civil Service fast streamer / Similar devices) $RemoveBeforeDE'[]'ePWTlfwkahwLXzQ$  - 0 Simple Staple / Suture removal (25 or less) $Remove'[]'IjoRaGg$  - 0 Complex Staple / Suture removal (26 or more) $Remove'[]'HEgAVwr$  - 0 Hypo / Hyperglycemic Management (close monitor of Blood Glucose) $RemoveBefore'[]'hpjraPXgqdMFn$  - 0 Ankle / Brachial Index (ABI) - do not check if billed separately X- 1 5 Vital Signs Has the patient been seen at the hospital within the last three years: Yes Total Score: 105 Level Of Care: New/Established - Level 3 Electronic Signature(s) Signed: 03/26/2021 5:09:03 PM By: Rhae Hammock RN Entered By: Rhae Hammock on 03/26/2021 10:43:22 -------------------------------------------------------------------------------- Encounter Discharge Information Details Patient Name: [name]: [name] RD Hanson. 03/26/2021 9:30 A M Medical Record Number: [id number] Patient Account Number: [id number] Date of Birth/Sex: Treating RN: [birth year] (80 y.o. [name] Primary Care Delsy Etzkorn: [name] Other Clinician: Referring  Ulric Salzman: Treating Ike Maragh/Extender: [name] Weeks in Treatment: 6 Encounter Discharge Information Items Discharge Condition: Stable Ambulatory Status: Ambulatory Discharge Destination: Home Transportation: Private Auto Schedule Follow-up Appointment: Yes Clinical Summary of Care: Provided on 03/26/2021 Form Type Recipient Paper Patient Patient Electronic Signature(s) Signed: 03/26/2021 11:43:59 AM By: Lorrin Jackson Entered By: Lorrin Jackson on 03/26/2021 11:43:58 -------------------------------------------------------------------------------- Lower Extremity Assessment Details Patient Name: [name]: [name] RD Hanson. 03/26/2021 9:30 A M Medical Record Number: [id number] Patient Account Number: [id number] Date of Birth/Sex: Treating RN: [birth year] (80 y.o. [name] Primary Care Jessejames Steelman: [name] Other Clinician: Referring Jaicee Michelotti: Treating Tyree Fluharty/Extender: [name] Weeks in Treatment: 6 Edema Assessment Assessed: [name]: No] [name]: Yes] Edema: [Left: Ye] [Right: s] Calf Left: Right: Point of Measurement: 33 cm From Medial Instep 38 cm Ankle Left: Right: Point of Measurement: 10 cm From Medial Instep 23 cm Vascular Assessment Pulses: Dorsalis Pedis Palpable: [Right:Yes] Electronic Signature(s) Signed: 03/26/2021 5:10:48 PM By: Lorrin Jackson Entered By: Lorrin Jackson on 03/26/2021 10:11:49 -------------------------------------------------------------------------------- Multi Wound Chart Details Patient Name: [name]: [name] RD Hanson. 03/26/2021 9:30 A M Medical Record Number: [id number] Patient Account Number: [id number] Date of Birth/Sex: Treating RN: [birth year] (80 y.o. 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] [N/A:N/A] Primary Etiology: [1:Cataracts, Chronic sinus] [N/A:N/A] Comorbid History: [1:problems/congestion, Asthma, Sleep Apnea, Hypertension, Peripheral Venous Disease, Osteoarthritis 01/10/2020] [N/A:N/A] Date Acquired: [1:6] [N/A:N/A] Weeks of Treatment: [1:Open] [N/A:N/A] Wound Status: [1:0.3x0.2x0.1] [N/A:N/A] Measurements L x  Hanson x D (cm) [1:0.047] [N/A:N/A] A (cm) : rea [1:0.005] [N/A:N/A] Volume (cm) : [1:95.00%] [N/A:N/A] % Reduction in Area: [1:97.30%] [N/A:N/A] % Reduction in Volume: [1:Full Thickness Without Exposed] [N/A:N/A] Classification: [1:Support Structures Medium] [N/A:N/A] Exudate Amount: [1:Serosanguineous] [N/A:N/A] Exudate Type: [1:red, brown] [N/A:N/A] Exudate Color: [1:Distinct, outline attached] [N/A:N/A] Wound Margin: [1:Large (67-100%)] [N/A:N/A] Granulation Amount: [1:Pink, Pale] [N/A:N/A] Granulation Quality: [1:None Present (0%)] [N/A:N/A] Necrotic Amount: [1:Fat Layer (Subcutaneous Tissue): Yes N/A] Exposed Structures: [1:Fascia: No Tendon: No Muscle: No Joint: No Bone: No Large (67-100%)] [N/A:N/A] Treatment Notes Electronic Signature(s) Signed: 03/26/2021 4:16:43 PM By: Linton Ham MD Signed: 03/28/2021 6:00:49 PM By: Deon Pilling Entered By: Linton Ham on 03/26/2021 10:39:32 -------------------------------------------------------------------------------- Multi-Disciplinary Care Plan Details Patient Name: [name]: [name] RD Hanson. 03/26/2021 9:30 A M Medical Record Number: [id number] Patient Account Number: [id number] Date of Birth/Sex: Treating RN: 03-03-1941 (80 y.o. [name], [name] Primary Care Marvena Tally: [name] Other  Clinician: Referring Ellajane Stong: Treating Amair Shrout/Extender: [name] Weeks in Treatment: 6 Active Inactive Necrotic Tissue Nursing Diagnoses: Impaired tissue integrity related to necrotic/devitalized tissue Knowledge deficit related to management of necrotic/devitalized tissue Goals: Necrotic/devitalized tissue will be minimized in the wound bed Date Initiated: 02/07/2021 Target Resolution Date: 04/24/2021 Goal Status: Active Patient/caregiver will verbalize understanding of reason and process for debridement of necrotic tissue Date Initiated: 02/07/2021 Target Resolution Date: 04/05/2021 Goal Status: Active Interventions: Assess patient pain level pre-, during and post procedure and prior to discharge Provide education on necrotic tissue and debridement process Treatment Activities: T ordered outside of clinic : 02/07/2021 est Notes: Osteomyelitis Nursing Diagnoses: Infection: osteomyelitis Goals: Diagnostic evaluation for osteomyelitis completed as ordered Date Initiated: 03/14/2021 Target Resolution Date: 04/12/2021 Goal Status: Active Signs and symptoms for osteomyelitis will be recognized and promptly addressed Date Initiated: 03/14/2021 Target Resolution Date: 04/12/2021 Goal Status: Active Interventions: Assess for signs and symptoms of osteomyelitis resolution every visit Provide education on osteomyelitis Screen for HBO Treatment Activities: MRI : 03/13/2021 Systemic antibiotics : 03/14/2021 Notes: Pain, Acute or Chronic Nursing Diagnoses: Pain, acute or chronic: actual or potential Potential alteration in comfort, pain Goals: Patient will verbalize adequate pain control and receive pain control interventions during procedures as needed Date Initiated: 02/07/2021 Target Resolution Date: 04/06/2021 Goal Status: Active Patient/caregiver will verbalize comfort level met Date Initiated: 02/07/2021 Date Inactivated: 02/28/2021 Target Resolution Date:  03/08/2021 Goal Status: Met Interventions: Complete pain assessment as per visit requirements Provide education on pain management Reposition patient for comfort Treatment Activities: Administer pain control measures as ordered : 02/07/2021 Notes: Electronic Signature(s) Signed: 03/26/2021 5:09:03 PM By: Rhae Hammock RN Entered By: Rhae Hammock on 03/26/2021 10:42:04 -------------------------------------------------------------------------------- Pain Assessment Details Patient Name: [name]: [name] RD Hanson. 03/26/2021 9:30 A M Medical Record Number: [id number] Patient Account Number: [id number] Date of Birth/Sex: Treating RN: 1941-02-17 (80 y.o. [name] Primary Care Joell Buerger: [name] Other Clinician: Referring Antero Derosia: Treating Fedra Lanter/Extender: [name] Weeks in Treatment: 6 Active Problems Location of Pain Severity and Description of Pain Patient Has Paino No Site Locations Pain Management and Medication Current Pain Management: Electronic Signature(s) Signed: 03/26/2021 10:08:57 AM By: Sandre Kitty Signed: 03/28/2021 6:00:49 PM By: Deon Pilling Entered By: Sandre Kitty on 03/26/2021 10:03:21 -------------------------------------------------------------------------------- Patient/Caregiver Education Details Patient Name: [name]: [name] RD Hanson. 5/17/2022andnbsp9:30 A M Medical Record Number: [id number] Patient Account Number: [id number] Date of Birth/Gender: Treating RN: 1941-01-29 (80 y.o. [name] Primary Care Physician: [name] Other Clinician:  Referring Physician: Treating Physician/Extender: Leonard Downing in Treatment: 6 Education Assessment Education Provided To: Patient Education Topics Provided Infection: Methods: Explain/Verbal Responses: State content correctly Pain: Wound Debridement: Electronic Signature(s) Signed:  03/26/2021 5:09:03 PM By: Rhae Hammock RN Entered By: Rhae Hammock on 03/26/2021 10:42:13 -------------------------------------------------------------------------------- Wound Assessment Details Patient Name: [name]: [name] RD Hanson. 03/26/2021 9:30 A M Medical Record Number: [id number] Patient Account Number: [id number] Date of Birth/Sex: Treating RN: [birth year] (80 y.o. [name], [name] Primary Care Linken Mcglothen: [name] Other Clinician: Referring Isidor Bromell: Treating Chrishana Spargur/Extender: [name] Weeks in Treatment: 6 Wound Status Wound Number: 1 Primary Neuropathic Ulcer-Non Diabetic Etiology: Wound Location: Right T Great oe Wound Open Wounding Event: Other Lesion Status: Date Acquired: 01/10/2020 Comorbid Cataracts, Chronic sinus problems/congestion, Asthma, Sleep Weeks Of Treatment: 6 History: Apnea, Hypertension, Peripheral Venous Disease, Osteoarthritis Clustered Wound: No Photos Wound Measurements Length: (cm) 0.3 Width: (cm) 0.2 Depth: (cm) 0.1 Area: (cm) 0.047 Volume: (cm) 0.005 % Reduction in Area: 95% % Reduction in Volume: 97.3% Epithelialization: Large (67-100%) Tunneling: No Undermining: No Wound Description Classification: Full Thickness Without Exposed Support Structures Wound Margin: Distinct, outline attached Exudate Amount: Medium Exudate Type: Serosanguineous Exudate Color: red, brown Foul Odor After Cleansing: No Slough/Fibrino No Wound Bed Granulation Amount: Large (67-100%) Exposed Structure Granulation Quality: Pink, Pale Fascia Exposed: No Necrotic Amount: None Present (0%) Fat Layer (Subcutaneous Tissue) Exposed: Yes Tendon Exposed: No Muscle Exposed: No Joint Exposed: No Bone Exposed: No Treatment Notes Wound #1 (Toe Great) Wound Laterality: Right Cleanser Wound Cleanser Discharge Instruction: Cleanse the wound with wound cleanser prior to applying a clean dressing using gauze  sponges, not tissue or cotton balls. Peri-Wound Care Topical Primary Dressing Hydrofera Blue Classic Foam, 2x2 in Discharge Instruction: Moisten with saline prior to applying to wound bed Secondary Dressing Woven Gauze Sponges 2x2 in Discharge Instruction: Apply over primary dressing as directed. Secured With Conforming Stretch Gauze Bandage, Sterile 2x75 (in/in) Discharge Instruction: Secure with stretch gauze as directed. Compression Wrap Compression Stockings Add-Ons Electronic Signature(s) Signed: 03/27/2021 11:07:31 AM By: Sandre Kitty Signed: 03/28/2021 6:00:49 PM By: Deon Pilling Previous Signature: 03/26/2021 10:08:57 AM Version By: Sandre Kitty Entered By: Sandre Kitty on 03/26/2021 16:18:35 -------------------------------------------------------------------------------- Vitals Details Patient Name: [name]: [name] RD Hanson. 03/26/2021 9:30 A M Medical Record Number: [id number] Patient Account Number: [id number] Date of Birth/Sex: Treating RN: 11-14-1940 (80 y.o. [name], [name] Primary Care Raquel Sayres: [name] Other Clinician: Referring Artavious Trebilcock: Treating Hermilo Dutter/Extender: [name] Weeks in Treatment: 6 Vital Signs Time Taken: 10:02 Temperature (F): 97.8 Height (in): 69 Pulse (bpm): 67 Weight (lbs): 250 Respiratory Rate (breaths/min): 20 Body Mass Index (BMI): 36.9 Blood Pressure (mmHg): 169/73 Reference Range: 80 - 120 mg / dl Electronic Signature(s) Signed: 03/26/2021 10:08:57 AM By: Sandre Kitty Entered By: Sandre Kitty on 03/26/2021 10:03:14

## 2021-03-29 ENCOUNTER — Encounter (HOSPITAL_BASED_OUTPATIENT_CLINIC_OR_DEPARTMENT_OTHER): Payer: Medicare Other | Admitting: Internal Medicine

## 2021-03-29 ENCOUNTER — Other Ambulatory Visit: Payer: Self-pay

## 2021-03-29 ENCOUNTER — Other Ambulatory Visit: Payer: Self-pay | Admitting: Urology

## 2021-03-29 DIAGNOSIS — L97518 Non-pressure chronic ulcer of other part of right foot with other specified severity: Secondary | ICD-10-CM | POA: Diagnosis not present

## 2021-03-29 DIAGNOSIS — M48062 Spinal stenosis, lumbar region with neurogenic claudication: Secondary | ICD-10-CM | POA: Diagnosis not present

## 2021-03-29 DIAGNOSIS — E291 Testicular hypofunction: Secondary | ICD-10-CM | POA: Diagnosis not present

## 2021-03-29 DIAGNOSIS — M86171 Other acute osteomyelitis, right ankle and foot: Secondary | ICD-10-CM | POA: Diagnosis not present

## 2021-03-29 DIAGNOSIS — R948 Abnormal results of function studies of other organs and systems: Secondary | ICD-10-CM | POA: Diagnosis not present

## 2021-03-29 NOTE — Progress Notes (Signed)
CAYLEN, YARDLEY ([id number]) Visit Report for 03/29/2021 SuperBill Details Patient Name: [name]: ASIF, MUCHOW [name] 03/29/2021 Medical Record Number: [id number] Patient Account Number: [id number] Date of Birth/Sex: Treating RN: 1940/12/15 (80 y.o. 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

## 2021-03-29 NOTE — Progress Notes (Signed)
GARVIN, ELLENA ([id number]) Visit Report for 03/29/2021 Arrival Information Details Patient Name: [name]: NORWIN, ALEMAN [name] 03/29/2021 11:30 A M Medical Record Number: [id number] Patient Account Number: [id number] Date of Birth/Sex: Treating RN: [birth year] (80 y.o. [name], [name] Primary Care Desma Wilkowski: [name] Other Clinician: Referring Cabria Micalizzi: Treating Bobie Caris/Extender: [name] Weeks in Treatment: 7 Visit Information History Since Last Visit Added or deleted any medications: No Patient Arrived: Ambulatory Any new allergies or adverse reactions: No Arrival Time: 12:03 Had a fall or experienced change in No Accompanied By: self activities of daily living that may affect Transfer Assistance: None risk of falls: Patient Identification Verified: Yes Signs or symptoms of abuse/neglect since last visito No Secondary Verification Process Completed: Yes Hospitalized since last visit: No Patient Requires Transmission-Based Precautions: No Implantable device outside of the clinic excluding No Patient Has Alerts: Yes cellular tissue based products placed in the center Patient Alerts: R ABI non compressible since last visit: Has Dressing in Place as Prescribed: Yes Pain Present Now: No Electronic Signature(s) Signed: 03/29/2021 5:06:55 PM By: Deon Pilling Entered By: Deon Pilling on 03/29/2021 12:03:44 -------------------------------------------------------------------------------- Clinic Level of Care Assessment Details Patient Name: [name]: DONAVEN, CRISWELL [name] 03/29/2021 11:30 A M Medical Record Number: [id number] Patient Account Number: [id number] Date of Birth/Sex: Treating RN: 09/21/1941 (80 y.o. [name], [name] Primary Care Lacreasha Hinds: [name] Other Clinician: Referring Leilynn Pilat: Treating Lamira Borin/Extender: [name] Weeks in Treatment: 7 Clinic Level of Care Assessment Items TOOL 4  Quantity Score X- 1 0 Use when only an EandM is performed on FOLLOW-UP visit ASSESSMENTS - Nursing Assessment / Reassessment X- 1 10 Reassessment of Co-morbidities (includes updates in patient status) X- 1 5 Reassessment of Adherence to Treatment Plan ASSESSMENTS - Wound and Skin A ssessment / Reassessment X - Simple Wound Assessment / Reassessment - one wound 1 5 []  - 0 Complex Wound Assessment / Reassessment - multiple wounds X- 1 10 Dermatologic / Skin Assessment (not related to wound area) ASSESSMENTS - Focused Assessment []  - 0 Circumferential Edema Measurements - multi extremities []  - 0 Nutritional Assessment / Counseling / Intervention []  - 0 Lower Extremity Assessment (monofilament, tuning fork, pulses) []  - 0 Peripheral Arterial Disease Assessment (using hand held doppler) ASSESSMENTS - Ostomy and/or Continence Assessment and Care []  - 0 Incontinence Assessment and Management []  - 0 Ostomy Care Assessment and Management (repouching, etc.) PROCESS - Coordination of Care []  - 0 Simple Patient / Family Education for ongoing care []  - 0 Complex (extensive) Patient / Family Education for ongoing care []  - 0 Staff obtains Programmer, systems, Records, T Results / Process Orders est []  - 0 Staff telephones HHA, Nursing Homes / Clarify orders / etc []  - 0 Routine Transfer to another Facility (non-emergent condition) []  - 0 Routine Hospital Admission (non-emergent condition) []  - 0 New Admissions / Biomedical engineer / Ordering NPWT Apligraf, etc. , []  - 0 Emergency Hospital Admission (emergent condition) X- 1 10 Simple Discharge Coordination []  - 0 Complex (extensive) Discharge Coordination PROCESS - Special Needs []  - 0 Pediatric / Minor Patient Management []  - 0 Isolation Patient Management []  - 0 Hearing / Language / Visual special needs []  - 0 Assessment of Community assistance (transportation, D/C planning, etc.) []  - 0 Additional assistance / Altered  mentation []  - 0 Support Surface(s) Assessment (bed, cushion, seat, etc.) INTERVENTIONS - Wound Cleansing / Measurement X - Simple Wound Cleansing - one wound 1 5 []  -  0 Complex Wound Cleansing - multiple wounds []  - 0 Wound Imaging (photographs - any number of wounds) []  - 0 Wound Tracing (instead of photographs) X- 1 5 Simple Wound Measurement - one wound []  - 0 Complex Wound Measurement - multiple wounds INTERVENTIONS - Wound Dressings []  - 0 Small Wound Dressing one or multiple wounds []  - 0 Medium Wound Dressing one or multiple wounds []  - 0 Large Wound Dressing one or multiple wounds []  - 0 Application of Medications - topical []  - 0 Application of Medications - injection INTERVENTIONS - Miscellaneous []  - 0 External ear exam []  - 0 Specimen Collection (cultures, biopsies, blood, body fluids, etc.) []  - 0 Specimen(s) / Culture(s) sent or taken to Lab for analysis []  - 0 Patient Transfer (multiple staff / Civil Service fast streamer / Similar devices) []  - 0 Simple Staple / Suture removal (25 or less) []  - 0 Complex Staple / Suture removal (26 or more) []  - 0 Hypo / Hyperglycemic Management (close monitor of Blood Glucose) []  - 0 Ankle / Brachial Index (ABI) - do not check if billed separately []  - 0 Vital Signs Has the patient been seen at the hospital within the last three years: Yes Total Score: 50 Level Of Care: New/Established - Level 2 Electronic Signature(s) Signed: 03/29/2021 5:06:55 PM By: Deon Pilling Entered By: Deon Pilling on 03/29/2021 12:06:57 -------------------------------------------------------------------------------- Encounter Discharge Information Details [name] 03/29/2021 11:30 A M Medical Record Number: [id number] Patient Account Number: [id number] Date of Birth/Sex: Treating RN: 05/26/1941 (80 y.o. [name] Primary Care Alija Riano: [name] Other Clinician: Referring Kitty Cadavid: Treating  Shawna Wearing/Extender: [name] Weeks in Treatment: 7 Encounter Discharge Information Items Discharge Condition: Stable Ambulatory Status: Ambulatory Discharge Destination: Home Transportation: Private Auto Accompanied By: self Schedule Follow-up Appointment: Yes Clinical Summary of Care: Patient Declined Electronic Signature(s) Signed: 03/29/2021 5:06:55 PM By: Deon Pilling Entered By: Deon Pilling on 03/29/2021 12:06:20 -------------------------------------------------------------------------------- Patient/Caregiver Education Details [name] 5/20/2022andnbsp11:30 A M Medical Record Number: [id number] Patient Account Number: [id number] Date of Birth/Gender: Treating RN: [birth year] (79 y.o. [name] Primary Care Physician: [name] Other Clinician: Referring Physician: Treating Physician/Extender: Leonard Downing in Treatment: 7 Education Assessment Education Provided To: Patient Education Topics Provided Infection: Pain: Wound Debridement: Electronic Signature(s) Signed: 03/29/2021 5:06:55 PM By: Deon Pilling Entered By: Deon Pilling on 03/29/2021 12:06:07 -------------------------------------------------------------------------------- Wound Assessment Details [name] 03/29/2021 11:30 A M Medical Record Number: [id number] Patient Account Number: [id number] Date of Birth/Sex: Treating RN: 1941/06/22 (80 y.o. [name], [name] Primary Care Aliesha Dolata: [name] Other Clinician: Referring Webster Patrone: Treating Herve Haug/Extender: [name] Weeks in Treatment: 7 Wound Status Wound Number: 1 Primary Etiology: Neuropathic Ulcer-Non Diabetic Wound Location: Right T Great oe Wound Status: Open Wounding Event: Other Lesion Date Acquired: 01/10/2020 Weeks Of Treatment: 7 Clustered Wound: No Wound  Measurements Length: (cm) 0.3 Width: (cm) 0.2 Depth: (cm) 0.1 Area: (cm) 0.047 Volume: (cm) 0.005 % Reduction in Area: 95% % Reduction in Volume: 97.3% Wound Description Classification: Full Thickness Without Exposed Support Structur es Treatment Notes Wound #1 (Toe Great) Wound Laterality: Right Cleanser Wound Cleanser Discharge Instruction: Cleanse the wound with wound cleanser prior to applying a clean dressing using gauze sponges, not tissue or cotton balls. Peri-Wound Care Topical Primary Dressing Hydrofera Blue Classic Foam, 2x2 in Discharge Instruction: Moisten with  saline prior to applying to wound bed Secondary Dressing Woven Gauze Sponges 2x2 in Discharge Instruction: Apply over primary dressing as directed. Secured With Conforming Stretch Gauze Bandage, Sterile 2x75 (in/in) Discharge Instruction: Secure with stretch gauze as directed. Compression Wrap Compression Stockings Add-Ons Electronic Signature(s) Signed: 03/29/2021 5:06:55 PM By: Deon Pilling Entered By: Deon Pilling on 03/29/2021 12:05:42 -------------------------------------------------------------------------------- Vitals Details [name] 03/29/2021 11:30 A M Medical Record Number: [id number] Patient Account Number: [id number] Date of Birth/Sex: Treating RN: 06/14/1941 (80 y.o. [name], [name] Primary Care Holman Bonsignore: [name] Other Clinician: Referring Lundon Verdejo: Treating Kehinde Bowdish/Extender: [name] Weeks in Treatment: 7 Vital Signs Time Taken: 12:03 Temperature (F): 98 Height (in): 69 Pulse (bpm): 72 Weight (lbs): 250 Respiratory Rate (breaths/min): 17 Body Mass Index (BMI): 36.9 Blood Pressure (mmHg): 164/84 Reference Range: 80 - 120 mg / dl Electronic Signature(s) Signed: 03/29/2021 5:06:55 PM By: Deon Pilling Entered By: Deon Pilling on 03/29/2021 12:05:28

## 2021-04-01 DIAGNOSIS — M19072 Primary osteoarthritis, left ankle and foot: Secondary | ICD-10-CM | POA: Diagnosis not present

## 2021-04-01 DIAGNOSIS — M778 Other enthesopathies, not elsewhere classified: Secondary | ICD-10-CM | POA: Diagnosis not present

## 2021-04-01 MED ORDER — BETAMETHASONE SOD PHOS & ACET 6 (3-3) MG/ML IJ SUSP
3.0000 mg | Freq: Once | INTRAMUSCULAR | Status: AC
  Administered 2021-04-01: 3 mg via INTRA_ARTICULAR

## 2021-04-01 NOTE — Progress Notes (Signed)
   HPI: 80 y.o. male presenting today as a new patient for evaluation of left foot pain this been going on for approximately 8-12 months now.  Gradual onset.  He has intermittent pain now.  He states that he has major pain on the side of his left foot.  It radiates to the bottom of the foot.  It is aggravated with walking.  He has not done anything for treatment.  Past Medical History:  Diagnosis Date  . Allergy    takes Zyrtec daily  . Anxiety    takes Ativan daily as needed  . Arthritis   . Asthma   . Back pain   . BPH (benign prostatic hyperplasia)    takes doxazosin for  . Carpal tunnel syndrome   . 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 Depression    takes Wellbutrin daily  . Dyspnea on exertion    with exertion  . GERD (gastroesophageal reflux disease)    takes Nexium and Omeprazole daily  . History of kidney stones   . HTN (hypertension)   . Hx of [facility] spotted fever childhood  . Hyperlipidemia   . Joint pain   . Neuromuscular disorder ([place])   . Prediabetes   . Sleep apnea    uses CPAP nightly  . SOB (shortness of breath)   . Swallowing difficulty   . Swelling of lower extremity    right more than leg leg  . Umbilical hernia      Physical Exam: General: The patient is alert and oriented x3 in no acute distress.  Dermatology: Skin is warm, dry and supple bilateral lower extremities. Negative for open lesions or macerations.  Vascular: Palpable pedal pulses bilaterally. No edema or erythema noted. Capillary refill within normal limits.  Neurological: Epicritic and protective threshold grossly intact bilaterally.   Musculoskeletal Exam: Range of motion within normal limits to all pedal and ankle joints bilateral. Muscle strength 5/5 in all groups bilateral.  Pain on palpation along the lateral aspect of the left foot  Radiographic Exam:  Normal osseous mineralization. Joint spaces preserved. No fracture/dislocation/boney destruction.     Assessment: 1.  Capsulitis/DJD left foot 2.  Possible neuritis left lateral foot subfifth metatarsal   Plan of Care:  1. Patient evaluated. X-Rays reviewed.  2.  Injection of 0.5 cc Celestone Soluspan injected to the plantar aspect of the left fifth metatarsal tubercle at the area of most pain 3.  Recommend good supportive stability sneakers 4.  Patient does have history of lumbar back pain.  Continue management for the back 5.  Return to clinic on next scheduled routine foot care appointment.      Edrick Kins, DPM [facility]  Dr. Edrick Kins, DPM    [address], [place] [address]                Office [phone]  Fax [phone]

## 2021-04-02 ENCOUNTER — Encounter (HOSPITAL_BASED_OUTPATIENT_CLINIC_OR_DEPARTMENT_OTHER): Payer: Medicare Other | Admitting: Internal Medicine

## 2021-04-03 ENCOUNTER — Telehealth: Payer: Self-pay | Admitting: Pulmonary Disease

## 2021-04-03 NOTE — Telephone Encounter (Signed)
Spoke with pt and scheduled for OV on Thursday at 12 with Geraldo Pitter. Nothing further needed at this time.  Routing to [employer]  as Juluis Rainier.

## 2021-04-03 NOTE — Telephone Encounter (Signed)
He needs appointment today or tomorrow to assess further.

## 2021-04-03 NOTE — Telephone Encounter (Signed)
Spoke with pt who states has had chest congestion x 3-4 weeks with an increase in strength and depth over the last couple of days. Pt states he has increased SOB and cough over last couple days as well. Pt denies N/V/D/F/C and states he is Covid -. Pt sates he is using maintance inhaler (Symbicort) daily and Mucinex some nights before bed but does not know if it is working. Dr. Halford Chessman please advise as Dr. Vaughan Browner is currently out of the office.

## 2021-04-04 ENCOUNTER — Ambulatory Visit: Payer: Medicare Other | Admitting: Primary Care

## 2021-04-04 NOTE — Progress Notes (Deleted)
@Patient  ID: [name], male    DOB: 28-Apr-1941, 80 y.o.   MRN: [id number]  No chief complaint on file.   Referring provider: Cari Caraway, MD  HPI: 80 year old male, never smoked.  Past medical history significant for mild intermittent asthma, allergic rhinitis, sleep apnea, venous insufficiency, GERD, chronic fatigue syndrome, stage 3 kidney disease, obesity.  Patient of Dr. Vaughan Browner, last seen in office on 12/05/2019.  Previous [facility] pulmonary encounters: 11/25/19- Mannam 80 year old with history of sleep apnea, allergic rhinitis, hyperlipidemia Referred for evaluation of dyspnea.  Symptoms have been going on for the past 2 years.  He has dyspnea on exertion and sometimes at rest.  Associated with wheeze, chest tightness.  Currently on albuterol inhaler.  Previously had tried Advair but cannot tell if it made a difference.  He stopped using it as it was too expensive.  Has seasonal allergies, significant issues with GERD and is on Nexium.  History also noted for colonic polyps.  Previously followed by Dr. Benson Norway Reports seeing blood in the stool recently and intermittent right upper quadrant pain.  Follows with Dr. Gwenlyn Found, cardiology with normal cardiac catheterization in 2008.  Echocardiogram and stress test in 2018 within normal limits.  Symptoms not felt to be related to the heart.  Has bilateral lower extremity edema which is attributed to venous insufficiency.  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  Reports significant basement dampness and flooding at home with mold issues.  No hot tub, Jacuzzi, down pillows, comforters Smoking history: Never smoker Travel history: No significant travel history Relevant family history: No significant family history of lung disease  Interim history: Started on Symbicort inhaler.  He is not using it on a regular basis.. Continues to have significant dyspnea on  exertion      No Known Allergies  Immunization History  Administered Date(s) Administered  . Hepatitis A, Adult 07/31/2016  . Influenza Split 09/28/2012, 09/10/2020  . Influenza,inj,Quad PF,6+ Mos 08/03/2013, 07/21/2014, 09/13/2015, 07/23/2016, 07/14/2019  . Moderna Sars-Covid-2 Vaccination 11/21/2019, 12/19/2019, 08/24/2020  . Pneumococcal Conjugate-13 03/25/2015  . Pneumococcal Polysaccharide-23 04/29/2011, 06/28/2016  . Tdap 04/25/2013    Past Medical History:  Diagnosis Date  . Allergy    takes Zyrtec daily  . Anxiety    takes Ativan daily as needed  . Arthritis   . Asthma   . Back pain   . BPH (benign prostatic hyperplasia)    takes doxazosin for  . Carpal tunnel syndrome   . 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 Depression    takes Wellbutrin daily  . Dyspnea on exertion    with exertion  . GERD (gastroesophageal reflux disease)    takes Nexium and Omeprazole daily  . History of kidney stones   . HTN (hypertension)   . Hx of [facility] spotted fever childhood  . Hyperlipidemia   . Joint pain   . Neuromuscular disorder ([place])   . Prediabetes   . Sleep apnea    uses CPAP nightly  . SOB (shortness of breath)   . Swallowing difficulty   . Swelling of lower extremity    right more than leg leg  . Umbilical hernia     Tobacco History: Social History   Tobacco Use  Smoking Status Never Smoker  Smokeless Tobacco Never Used   Counseling given: Not Answered   Outpatient Medications Prior to Visit  Medication Sig Dispense Refill  .  B-D 3CC LUER-LOK SYR 21GX1-1/2 21G X 1-1/2" 3 ML MISC USE TO INJECT DEPOTESTOSTERONE. 20 each 0  . budesonide-formoterol (SYMBICORT) 160-4.5 MCG/ACT inhaler Inhale 2 puffs into the lungs 2 (two) times daily. 1 Inhaler 3  . buPROPion (WELLBUTRIN XL) 300 MG 24 hr tablet TAKE 1 TABLET BY MOUTH DAILY *NEEDS OFFICE VISIT (Patient taking differently: Take 300 mg by mouth daily.) 30 tablet 11  . calcium carbonate 200  MG capsule Take 200 mg by mouth daily.     . cetirizine (ZYRTEC) 10 MG tablet Take 10 mg by mouth daily.    . citalopram (CELEXA) 10 MG tablet Take by mouth.    . doxazosin (CARDURA) 8 MG tablet TAKE 1 TABLET BY MOUTH DAILY  . Take at night (Patient taking differently: Take 8 mg by mouth at bedtime.) 30 tablet 11  . esomeprazole (NEXIUM) 40 MG capsule TAKE ONE CAPSULE BY MOUTH TWICE A DAY BEFORE MEALS 90 capsule 3  . etodolac (LODINE) 400 MG tablet Take 1 tablet (400 mg total) by mouth 2 (two) times daily as needed. 180 tablet 1  . flurazepam (DALMANE) 15 MG capsule Take by mouth.    . fluticasone (FLONASE) 50 MCG/ACT nasal spray Place 2 sprays into both nostrils daily. (Patient taking differently: Place 2 sprays into both nostrils daily as needed for allergies.) 16 g 3  . fluticasone furoate-vilanterol (BREO ELLIPTA) 100-25 MCG/INH AEPB Inhale 1 puff into the lungs daily. 28 each 0  . furosemide (LASIX) 20 MG tablet TAKE 2 TABLETS BY MOUTH ONCE A DAY (Patient taking differently: Take 20 mg by mouth daily.) 60 tablet 0  . gabapentin (NEURONTIN) 300 MG capsule TAKE 1 TO 2 CAPSULES BY MOUTH AT BEDTIME (Patient taking differently: Take 600 mg by mouth at bedtime.) 60 capsule 0  . Glucosamine-Chondroit-Vit C-Mn (GLUCOSAMINE-CHONDROITIN) CAPS     . glucosamine-chondroitin 500-400 MG tablet Take 1 tablet by mouth 2 (two) times daily.    [name] HYDROcodone-acetaminophen (NORCO) 5-325 MG tablet 1 po q d prn pain (Patient taking differently: Take 1 tablet by mouth daily. Additional taking in the afternoon as needed) 45 tablet 0  . ipratropium (ATROVENT HFA) 17 MCG/ACT inhaler Inhale into the lungs.    [name] LORazepam (ATIVAN) 1 MG tablet Take 0.5-1 mg by mouth at bedtime.    . meloxicam (MOBIC) 15 MG tablet TAKE 1/2 TO 1 TABLET BY MOUTH DAILY AS NEEDED FOR PAIN 90 tablet 3  . methocarbamol (ROBAXIN) 500 MG tablet Take 1 tablet (500 mg total) by mouth every 6 (six) hours as needed for muscle spasms. (Patient taking  differently: Take 500 mg by mouth in the morning and at bedtime.) 60 tablet 3  . mirtazapine (REMERON) 15 MG tablet 1 tablet at bedtime.    . Multiple Vitamin (MULTIVITAMIN) tablet Take 1 tablet by mouth daily.    . Multiple Vitamins-Minerals (CENTRUM SILVER) tablet See admin instructions.    [name] NEEDLE, DISP, 21 G 21G X 1-1/2" MISC Use to inject depotestosterone. 50 each 0  . omeprazole (PRILOSEC) 20 MG capsule 1 capsule 30 minutes before morning meal    . potassium chloride (KLOR-CON) 20 MEQ packet Take by mouth.    . potassium chloride SA (K-DUR,KLOR-CON) 20 MEQ tablet Take 1 tablet (20 mEq total) by mouth 2 (two) times daily. 18 tablet 3  . PROAIR HFA 108 (90 Base) MCG/ACT inhaler Inhale 1-2 puffs into the lungs every 6 (six) hours as needed for wheezing or shortness of breath. 8 g 6  .  rosuvastatin (CRESTOR) 5 MG tablet Take 5 mg by mouth daily.    . sildenafil (REVATIO) 20 MG tablet Take by mouth.    . Syringe, Reusable, 3 ML MISC Use to inject depotestosterone. 1 each 0  . testosterone cypionate (DEPOTESTOTERONE CYPIONATE) 100 MG/ML injection 1 ml    . TESTOSTERONE IM Inject into the muscle once a week.     No facility-administered medications prior to visit.      Review of Systems  Review of Systems   Physical Exam  There were no vitals taken for this visit. Physical Exam   Lab Results:  CBC    Component Value Date/Time   WBC 6.0 12/05/2019 0957   RBC 5.01 12/05/2019 0957   HGB 16.0 03/14/2020 0817   HCT 47.0 03/14/2020 0817   PLT 164.0 12/05/2019 0957   MCV 92.8 12/05/2019 0957   MCV 90.9 05/19/2016 1235   MCH 31.5 06/24/2016 1500   MCHC 33.2 12/05/2019 0957   RDW 14.1 12/05/2019 0957   LYMPHSABS 0.9 12/05/2019 0957   MONOABS 0.5 12/05/2019 0957   EOSABS 0.2 12/05/2019 0957   BASOSABS 0.0 12/05/2019 0957    BMET    Component Value Date/Time   NA 137 02/08/2021 1542   K 4.0 02/08/2021 1542   CL 102 02/08/2021 1542   CO2 27 02/08/2021 1542   GLUCOSE 112  (H) 02/08/2021 1542   BUN 21 02/08/2021 1542   CREATININE 1.77 (H) 02/08/2021 1542   CREATININE 1.72 (H) 01/15/2018 1432   CALCIUM 9.1 02/08/2021 1542   GFRNONAA 39 (L) 02/08/2021 1542   GFRNONAA 45 (L) 05/19/2016 1215   GFRAA 48 (L) 06/24/2016 1500   GFRAA 51 (L) 05/19/2016 1215    BNP    Component Value Date/Time   BNP 12.7 08/22/2013 1155    ProBNP    Component Value Date/Time   PROBNP 19.0 10/13/2019 1209    Imaging: MR TOES RIGHT W WO CONTRAST  Result Date: 03/14/2021 CLINICAL DATA:  Nonhealing wound of great toe for 1 year EXAM: MRI OF THE RIGHT TOES WITHOUT AND WITH CONTRAST TECHNIQUE: Multiplanar, multisequence MR imaging of the right toes was performed both before and after administration of intravenous contrast. CONTRAST:  55mL MULTIHANCE GADOBENATE DIMEGLUMINE 529 MG/ML IV SOLN COMPARISON:  X-ray 02/13/2021 FINDINGS: Bones/Joint/Cartilage Bone marrow edema and enhancement within the distal 1.0 cm of the great toe distal phalanx with intermediate to low T1 marrow signal (series 8, image 11). Subtle erosion of the distal tuft, as seen radiographically. Remaining osseous structures of the distal forefoot are intact without additional site of bony erosion or marrow replacement. Mild degenerative changes of the first MTP joint, great toe IP joint, as well as the interphalangeal joints of the remaining digits. No joint effusion. Ligaments Intact Lisfranc ligament. Collateral ligaments of the forefoot appear intact. Muscles and Tendons Atrophy and fatty infiltration of the intrinsic foot musculature suggesting chronic denervation changes. Intact flexor and extensor tendons. No tenosynovitis. Soft tissues Mild soft tissue edema with enhancement at the distal aspect of the great toe distal phalanx. Mild dorsal subcutaneous edema. No organized or rim enhancing fluid collection. IMPRESSION: 1. Acute osteomyelitis involving the tuft of the great toe distal phalanx. 2. Mild soft tissue edema  with enhancement at the distal aspect of the great toe distal phalanx suggesting cellulitis. No organized or rim enhancing fluid collection. 3. Mild degenerative changes of the right forefoot. These results will be called to the ordering clinician or representative by the Radiologist Assistant, and  communication documented in the PACS or [employer]. Electronically Signed   By: Davina Poke D.O.   On: 03/14/2021 08:31   DG Foot Complete Left  Result Date: 03/18/2021 Please see detailed radiograph report in office note.    Assessment & Plan:   No problem-specific Assessment & Plan notes found for this encounter.     Martyn Ehrich, NP 04/04/2021

## 2021-04-05 ENCOUNTER — Encounter (HOSPITAL_BASED_OUTPATIENT_CLINIC_OR_DEPARTMENT_OTHER): Payer: Medicare Other | Admitting: Internal Medicine

## 2021-04-05 ENCOUNTER — Ambulatory Visit (INDEPENDENT_AMBULATORY_CARE_PROVIDER_SITE_OTHER): Payer: Medicare Other | Admitting: Primary Care

## 2021-04-05 ENCOUNTER — Other Ambulatory Visit: Payer: Self-pay

## 2021-04-05 ENCOUNTER — Encounter: Payer: Self-pay | Admitting: Primary Care

## 2021-04-05 ENCOUNTER — Ambulatory Visit (INDEPENDENT_AMBULATORY_CARE_PROVIDER_SITE_OTHER): Payer: Medicare Other

## 2021-04-05 VITALS — BP 132/84 | HR 77 | Temp 98.4°F | Ht 69.0 in | Wt 261.4 lb

## 2021-04-05 DIAGNOSIS — Z9109 Other allergy status, other than to drugs and biological substances: Secondary | ICD-10-CM

## 2021-04-05 DIAGNOSIS — R0602 Shortness of breath: Secondary | ICD-10-CM

## 2021-04-05 DIAGNOSIS — J4520 Mild intermittent asthma, uncomplicated: Secondary | ICD-10-CM | POA: Diagnosis not present

## 2021-04-05 DIAGNOSIS — J301 Allergic rhinitis due to pollen: Secondary | ICD-10-CM

## 2021-04-05 DIAGNOSIS — R21 Rash and other nonspecific skin eruption: Secondary | ICD-10-CM | POA: Diagnosis not present

## 2021-04-05 MED ORDER — FLUTICASONE PROPIONATE 50 MCG/ACT NA SUSP: 1.0000 | Freq: Every day | NASAL | 2 refills | Status: DC

## 2021-04-05 MED ORDER — ALBUTEROL SULFATE 108 (90 BASE) MCG/ACT IN AEPB: 2.0000 | INHALATION_SPRAY | Freq: Four times a day (QID) | RESPIRATORY_TRACT | 3 refills | Status: DC | PRN

## 2021-04-05 MED ORDER — PROAIR HFA 108 (90 BASE) MCG/ACT IN AERS: 1.0000 | INHALATION_SPRAY | Freq: Four times a day (QID) | RESPIRATORY_TRACT | 6 refills | Status: DC | PRN

## 2021-04-05 MED ORDER — GUAIFENESIN ER 600 MG PO TB12: 600.0000 mg | ORAL_TABLET | Freq: Two times a day (BID) | ORAL | 1 refills | Status: DC | PRN

## 2021-04-05 NOTE — Progress Notes (Addendum)
@Patient  ID: [name], male    DOB: 1941/04/28, 80 y.o.   MRN: [id number]  Chief Complaint  Patient presents with  . Follow-up    Congestion, SOB with exertion.    Referring provider: Cari Caraway, MD  HPI: 80 year old male, never smoked.  Past medical history significant for mild intermittent asthma, allergic rhinitis, sleep apnea, venous insufficiency, GERD, chronic fatigue syndrome, stage 3 kidney disease, obesity.  Patient of Dr. Vaughan Browner, last seen in office on 12/05/2019.  Previous [facility] pulmonary encounters: 11/25/19- Mannam 80 year old with history of sleep apnea, allergic rhinitis, hyperlipidemia Referred for evaluation of dyspnea.  Symptoms have been going on for the past 2 years.  He has dyspnea on exertion and sometimes at rest.  Associated with wheeze, chest tightness.  Currently on albuterol inhaler.  Previously had tried Advair but cannot tell if it made a difference.  He stopped using it as it was too expensive.  Has seasonal allergies, significant issues with GERD and is on Nexium.  History also noted for colonic polyps.  Previously followed by Dr. Benson Norway Reports seeing blood in the stool recently and intermittent right upper quadrant pain.  Follows with Dr. Gwenlyn Found, cardiology with normal cardiac catheterization in 2008.  Echocardiogram and stress test in 2018 within normal limits.  Symptoms not felt to be related to the heart.  Has bilateral lower extremity edema which is attributed to venous insufficiency.  Started on Symbicort inhaler.  He is not using it on a regular basis.. Continues to have significant dyspnea on exertion    04/05/2021 Interim history: Patient presents today for acute visit. He reports chest congestion for several months with some associated wheezing. Difficulty with fatigue and dyspnea with activity. Her overheats with exertion. Cough is non-productive, unable to get phlegm up. He is using Symbicort 149mxg two puffs morning and evening. He is not  taking anything over the counter for cough.    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  Reports significant basement dampness and flooding at home with mold issues.  No hot tub, Jacuzzi, down pillows, comforters Smoking history: Never smoker Travel history: No significant travel history Relevant family history: No significant family history of lung disease  No Known Allergies  Immunization History  Administered Date(s) Administered  . Hepatitis A, Adult 07/31/2016  . Influenza Split 09/28/2012, 09/10/2020  . Influenza,inj,Quad PF,6+ Mos 08/03/2013, 07/21/2014, 09/13/2015, 07/23/2016, 07/14/2019  . Moderna Sars-Covid-2 Vaccination 11/21/2019, 12/19/2019, 08/24/2020  . Pneumococcal Conjugate-13 03/25/2015  . Pneumococcal Polysaccharide-23 04/29/2011, 06/28/2016  . Tdap 04/25/2013    Past Medical History:  Diagnosis Date  . Allergy    takes Zyrtec daily  . Anxiety    takes Ativan daily as needed  . Arthritis   . Asthma   . Back pain   . BPH (benign prostatic hyperplasia)    takes doxazosin for  . Carpal tunnel syndrome   . 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 Depression    takes Wellbutrin daily  . Dyspnea on exertion    with exertion  . GERD (gastroesophageal reflux disease)    takes Nexium and Omeprazole daily  . History of kidney stones   . HTN (hypertension)   . 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 Hyperlipidemia   . Joint pain   . Neuromuscular disorder ([place])   . Prediabetes   . Sleep apnea  uses CPAP nightly  . SOB (shortness of breath)   . Swallowing difficulty   . Swelling of lower extremity    right more than leg leg  . Umbilical hernia     Tobacco History: Social History   Tobacco Use  Smoking Status Never Smoker  Smokeless Tobacco Never Used   Counseling given: Not Answered   Outpatient Medications Prior to  Visit  Medication Sig Dispense Refill  . B-D 3CC LUER-LOK SYR 21GX1-1/2 21G X 1-1/2" 3 ML MISC USE TO INJECT DEPOTESTOSTERONE. 20 each 0  . budesonide-formoterol (SYMBICORT) 160-4.5 MCG/ACT inhaler Inhale 2 puffs into the lungs 2 (two) times daily. 1 Inhaler 3  . buPROPion (WELLBUTRIN XL) 300 MG 24 hr tablet TAKE 1 TABLET BY MOUTH DAILY *NEEDS OFFICE VISIT (Patient taking differently: Take 300 mg by mouth daily.) 30 tablet 11  . calcium carbonate 200 MG capsule Take 200 mg by mouth daily.     . cetirizine (ZYRTEC) 10 MG tablet Take 10 mg by mouth daily.    . citalopram (CELEXA) 10 MG tablet Take by mouth.    . doxazosin (CARDURA) 8 MG tablet TAKE 1 TABLET BY MOUTH DAILY  . Take at night (Patient taking differently: Take 8 mg by mouth at bedtime.) 30 tablet 11  . esomeprazole (NEXIUM) 40 MG capsule TAKE ONE CAPSULE BY MOUTH TWICE A DAY BEFORE MEALS 90 capsule 3  . etodolac (LODINE) 400 MG tablet Take 1 tablet (400 mg total) by mouth 2 (two) times daily as needed. 180 tablet 1  . flurazepam (DALMANE) 15 MG capsule Take by mouth.    . furosemide (LASIX) 20 MG tablet TAKE 2 TABLETS BY MOUTH ONCE A DAY (Patient taking differently: Take 20 mg by mouth daily.) 60 tablet 0  . gabapentin (NEURONTIN) 300 MG capsule TAKE 1 TO 2 CAPSULES BY MOUTH AT BEDTIME (Patient taking differently: Take 600 mg by mouth at bedtime.) 60 capsule 0  . Glucosamine-Chondroit-Vit C-Mn (GLUCOSAMINE-CHONDROITIN) CAPS     . glucosamine-chondroitin 500-400 MG tablet Take 1 tablet by mouth 2 (two) times daily.    [name] HYDROcodone-acetaminophen (NORCO) 5-325 MG tablet 1 po q d prn pain (Patient taking differently: Take 1 tablet by mouth daily. Additional taking in the afternoon as needed) 45 tablet 0  . ipratropium (ATROVENT HFA) 17 MCG/ACT inhaler Inhale into the lungs.    [name] LORazepam (ATIVAN) 1 MG tablet Take 0.5-1 mg by mouth at bedtime.    . meloxicam (MOBIC) 15 MG tablet TAKE 1/2 TO 1 TABLET BY MOUTH DAILY AS NEEDED FOR PAIN 90  tablet 3  . methocarbamol (ROBAXIN) 500 MG tablet Take 1 tablet (500 mg total) by mouth every 6 (six) hours as needed for muscle spasms. (Patient taking differently: Take 500 mg by mouth in the morning and at bedtime.) 60 tablet 3  . mirtazapine (REMERON) 15 MG tablet 1 tablet at bedtime.    . Multiple Vitamin (MULTIVITAMIN) tablet Take 1 tablet by mouth daily.    . Multiple Vitamins-Minerals (CENTRUM SILVER) tablet See admin instructions.    [name] NEEDLE, DISP, 21 G 21G X 1-1/2" MISC Use to inject depotestosterone. 50 each 0  . omeprazole (PRILOSEC) 20 MG capsule 1 capsule 30 minutes before morning meal    . potassium chloride (KLOR-CON) 20 MEQ packet Take by mouth.    . potassium chloride SA (K-DUR,KLOR-CON) 20 MEQ tablet Take 1 tablet (20 mEq total) by mouth 2 (two) times daily. 18 tablet 3  . rosuvastatin (CRESTOR) 5 MG tablet Take  5 mg by mouth daily.    . sildenafil (REVATIO) 20 MG tablet Take by mouth.    . Syringe, Reusable, 3 ML MISC Use to inject depotestosterone. 1 each 0  . testosterone cypionate (DEPOTESTOTERONE CYPIONATE) 100 MG/ML injection 1 ml    . TESTOSTERONE IM Inject into the muscle once a week.    . fluticasone (FLONASE) 50 MCG/ACT nasal spray Place 2 sprays into both nostrils daily. (Patient taking differently: Place 2 sprays into both nostrils daily as needed for allergies.) 16 g 3  . fluticasone furoate-vilanterol (BREO ELLIPTA) 100-25 MCG/INH AEPB Inhale 1 puff into the lungs daily. (Patient not taking: Reported on 04/05/2021) 28 each 0  . PROAIR HFA 108 (90 Base) MCG/ACT inhaler Inhale 1-2 puffs into the lungs every 6 (six) hours as needed for wheezing or shortness of breath. (Patient not taking: Reported on 04/05/2021) 8 g 6   No facility-administered medications prior to visit.    Review of Systems  Review of Systems  Constitutional: Positive for fatigue.  HENT: Positive for congestion.        Left ear fullness  Respiratory: Positive for cough and wheezing.         Dyspnea  Cardiovascular: Negative.     Physical Exam  BP 132/84 (BP Location: Left Arm, Cuff Size: Normal)   Pulse 77   Temp 98.4 F (36.9 C) (Temporal)   Ht 5\' 9"  (1.753 m)   Wt 261 lb 6.4 oz (118.6 kg)   SpO2 95% Comment: RA  BMI 38.60 kg/m  Physical Exam Constitutional:      Appearance: Normal appearance.  HENT:     Right Ear: There is impacted cerumen.     Left Ear: Tympanic membrane normal.     Mouth/Throat:     Mouth: Mucous membranes are moist.     Pharynx: Oropharynx is clear.  Cardiovascular:     Rate and Rhythm: Normal rate and regular rhythm.  Pulmonary:     Effort: Pulmonary effort is normal.     Breath sounds: Normal breath sounds. No wheezing, rhonchi or rales.     Comments: CTA, poor effort  Musculoskeletal:        General: Normal range of motion.  Skin:    General: Skin is warm and dry.  Neurological:     General: No focal deficit present.     Mental Status: He is alert and oriented to person, place, and time. Mental status is at baseline.  Psychiatric:        Mood and Affect: Mood normal.        Behavior: Behavior normal.        Thought Content: Thought content normal.        Judgment: Judgment normal.      Lab Results:  CBC    Component Value Date/Time   WBC 6.0 12/05/2019 0957   RBC 5.01 12/05/2019 0957   HGB 16.0 03/14/2020 0817   HCT 47.0 03/14/2020 0817   PLT 164.0 12/05/2019 0957   MCV 92.8 12/05/2019 0957   MCV 90.9 05/19/2016 1235   MCH 31.5 06/24/2016 1500   MCHC 33.2 12/05/2019 0957   RDW 14.1 12/05/2019 0957   LYMPHSABS 0.9 12/05/2019 0957   MONOABS 0.5 12/05/2019 0957   EOSABS 0.2 12/05/2019 0957   BASOSABS 0.0 12/05/2019 0957    BMET    Component Value Date/Time   NA 137 02/08/2021 1542   K 4.0 02/08/2021 1542   CL 102 02/08/2021 1542   CO2 27 02/08/2021  1542   GLUCOSE 112 (H) 02/08/2021 1542   BUN 21 02/08/2021 1542   CREATININE 1.77 (H) 02/08/2021 1542   CREATININE 1.72 (H) 01/15/2018 1432   CALCIUM 9.1  02/08/2021 1542   GFRNONAA 39 (L) 02/08/2021 1542   GFRNONAA 45 (L) 05/19/2016 1215   GFRAA 48 (L) 06/24/2016 1500   GFRAA 51 (L) 05/19/2016 1215    BNP    Component Value Date/Time   BNP 12.7 08/22/2013 1155    ProBNP    Component Value Date/Time   PROBNP 19.0 10/13/2019 1209    Imaging: DG Chest 2 View  Result Date: 04/05/2021 CLINICAL DATA:  80 year old male with shortness of breath. EXAM: CHEST - 2 VIEW COMPARISON:  Chest radiograph dated 08/05/2019. FINDINGS: No focal consolidation, pleural effusion, or pneumothorax. There is chronic interstitial coarsening and bronchitic changes. Stable cardiac silhouette. No acute osseous pathology. Degenerative changes of the spine. IMPRESSION: No active cardiopulmonary disease. Electronically Signed   By: Anner Crete M.D.   On: 04/05/2021 15:52   MR TOES RIGHT W WO CONTRAST  Result Date: 03/14/2021 CLINICAL DATA:  Nonhealing wound of great toe for 1 year EXAM: MRI OF THE RIGHT TOES WITHOUT AND WITH CONTRAST TECHNIQUE: Multiplanar, multisequence MR imaging of the right toes was performed both before and after administration of intravenous contrast. CONTRAST:  55mL MULTIHANCE GADOBENATE DIMEGLUMINE 529 MG/ML IV SOLN COMPARISON:  X-ray 02/13/2021 FINDINGS: Bones/Joint/Cartilage Bone marrow edema and enhancement within the distal 1.0 cm of the great toe distal phalanx with intermediate to low T1 marrow signal (series 8, image 11). Subtle erosion of the distal tuft, as seen radiographically. Remaining osseous structures of the distal forefoot are intact without additional site of bony erosion or marrow replacement. Mild degenerative changes of the first MTP joint, great toe IP joint, as well as the interphalangeal joints of the remaining digits. No joint effusion. Ligaments Intact Lisfranc ligament. Collateral ligaments of the forefoot appear intact. Muscles and Tendons Atrophy and fatty infiltration of the intrinsic foot musculature suggesting  chronic denervation changes. Intact flexor and extensor tendons. No tenosynovitis. Soft tissues Mild soft tissue edema with enhancement at the distal aspect of the great toe distal phalanx. Mild dorsal subcutaneous edema. No organized or rim enhancing fluid collection. IMPRESSION: 1. Acute osteomyelitis involving the tuft of the great toe distal phalanx. 2. Mild soft tissue edema with enhancement at the distal aspect of the great toe distal phalanx suggesting cellulitis. No organized or rim enhancing fluid collection. 3. Mild degenerative changes of the right forefoot. These results will be called to the ordering clinician or representative by the Radiologist Assistant, and communication documented in the PACS or [employer]. Electronically Signed   By: Davina Poke D.O.   On: 03/14/2021 08:31   DG Foot Complete Left  Result Date: 03/18/2021 Please see detailed radiograph report in office note.    Assessment & Plan:   Mild intermittent asthma - Patient reports chest congestion for several months with associated wheezing  - Lungs clear on exam. O2 95% RA  - Compliant with Symbicort 160 two puffs twice daily; refill Albuterol hfa q 6 hours prn sob/wheezing - CXR today showed no acute process. There is chronic interstitial coarsening and bronchitic changes - Recommend patient start Mucinex 600mg  morning and evening with glass of water - Checking CBC, BMET and BNP  - Fu with Dr. Vaughan Browner in July, if cough peristing would consider getting HRCT   Allergic rhinitis due to pollen - Start Flonase nasal spray 1 puff  per nostril daily - Advised patient to ensure he is taking Cetirazine (Zyrtec) 10mg  daily    Martyn Ehrich, NP 04/05/2021

## 2021-04-05 NOTE — Assessment & Plan Note (Addendum)
-   Patient reports chest congestion for several months with associated wheezing  - Lungs clear on exam. O2 95% RA  - Compliant with Symbicort 160 two puffs twice daily; refill Albuterol hfa q 6 hours prn sob/wheezing - CXR today showed no acute process. There is chronic interstitial coarsening and bronchitic changes - Recommend patient start Mucinex 600mg  morning and evening with glass of water - Checking CBC, BMET and BNP  - Fu with Dr. Vaughan Browner in July, if cough peristing would consider getting HRCT

## 2021-04-05 NOTE — Assessment & Plan Note (Signed)
-   Start Flonase nasal spray 1 puff per nostril daily - Advised patient to ensure he is taking Cetirazine (Zyrtec) 10mg  daily

## 2021-04-05 NOTE — Patient Instructions (Addendum)
  Nice meeting you today [name], sorry you do not feel well   Recommendations: - Start Flonase nasal spray 1 puff per nostril daily - Start Mucinex 600mg  morning and evening with glass of water  - Make sure you are taking Cetirazine (Zyrtec) 10mg  daily  - Continue Symbicort 160 two puffs morning and evening (rinse mouth after use) - Use albuterol (proair) rescue inhaler 2 puffs every 4-6 hours as needed for shortness of breath - Try debrox ear drops for three days to right ear and then schedule ear lavage with PCP  Orders: - CXR today (ordered) - Labs today (ordered)  Follow-up: - July 25th, 26th or 27th with Dr. Vaughan Browner (15 min slot)

## 2021-04-06 LAB — CBC WITH DIFFERENTIAL/PLATELET
Absolute Monocytes: 458 cells/uL (ref 200–950)
Basophils Absolute: 31 cells/uL (ref 0–200)
Basophils Relative: 0.5 %
Eosinophils Absolute: 183 cells/uL (ref 15–500)
Eosinophils Relative: 3 %
HCT: 46.6 % (ref 38.5–50.0)
Hemoglobin: 15.9 g/dL (ref 13.2–17.1)
Lymphs Abs: 1031 cells/uL (ref 850–3900)
MCH: 31.3 pg (ref 27.0–33.0)
MCHC: 34.1 g/dL (ref 32.0–36.0)
MCV: 91.7 fL (ref 80.0–100.0)
MPV: 10.6 fL (ref 7.5–12.5)
Monocytes Relative: 7.5 %
Neutro Abs: 4398 cells/uL (ref 1500–7800)
Neutrophils Relative %: 72.1 %
Platelets: 162 10*3/uL (ref 140–400)
RBC: 5.08 10*6/uL (ref 4.20–5.80)
RDW: 13.5 % (ref 11.0–15.0)
Total Lymphocyte: 16.9 %
WBC: 6.1 10*3/uL (ref 3.8–10.8)

## 2021-04-06 LAB — BRAIN NATRIURETIC PEPTIDE: Brain Natriuretic Peptide: 24 pg/mL (ref <100)

## 2021-04-06 LAB — BASIC METABOLIC PANEL
BUN/Creatinine Ratio: 12 (calc) (ref 6–22)
BUN: 21 mg/dL (ref 7–25)
CO2: 30 mmol/L (ref 20–32)
Calcium: 9.9 mg/dL (ref 8.6–10.3)
Chloride: 103 mmol/L (ref 98–110)
Creat: 1.72 mg/dL — ABNORMAL HIGH (ref 0.70–1.18)
Glucose, Bld: 89 mg/dL (ref 65–99)
Potassium: 4.3 mmol/L (ref 3.5–5.3)
Sodium: 144 mmol/L (ref 135–146)

## 2021-04-09 ENCOUNTER — Encounter: Payer: Self-pay | Admitting: *Deleted

## 2021-04-09 NOTE — Progress Notes (Signed)
CBC and BNP were normal. Kidney function/creatinine was elevated but around his baseline 1.72

## 2021-04-11 ENCOUNTER — Other Ambulatory Visit: Payer: Self-pay

## 2021-04-11 ENCOUNTER — Encounter (HOSPITAL_BASED_OUTPATIENT_CLINIC_OR_DEPARTMENT_OTHER): Payer: Medicare Other | Attending: Internal Medicine | Admitting: Internal Medicine

## 2021-04-11 DIAGNOSIS — L97518 Non-pressure chronic ulcer of other part of right foot with other specified severity: Secondary | ICD-10-CM | POA: Diagnosis not present

## 2021-04-11 DIAGNOSIS — M48062 Spinal stenosis, lumbar region with neurogenic claudication: Secondary | ICD-10-CM | POA: Diagnosis not present

## 2021-04-11 DIAGNOSIS — M86171 Other acute osteomyelitis, right ankle and foot: Secondary | ICD-10-CM | POA: Diagnosis not present

## 2021-04-11 DIAGNOSIS — G629 Polyneuropathy, unspecified: Secondary | ICD-10-CM | POA: Diagnosis not present

## 2021-04-11 DIAGNOSIS — L97512 Non-pressure chronic ulcer of other part of right foot with fat layer exposed: Secondary | ICD-10-CM | POA: Diagnosis not present

## 2021-04-11 NOTE — Progress Notes (Signed)
[name], [name] ([id number]) Visit Report for 04/11/2021 Debridement Details Patient Name: [name]: [name], [name] [name] 04/11/2021 9:15 A M Medical Record Number: [id number] Patient Account Number: [id number] Date of Birth/Sex: Treating RN: 06-21-1941 (80 y.o. [name], [name] Primary Care Provider: [name] Other Clinician: Referring Provider: Treating Provider/Extender: [name] Weeks in Treatment: 9 Debridement Performed for Assessment: Wound #1 Right T Great oe Performed By: Physician Ricard Dillon., MD Debridement Type: Debridement Level of Consciousness (Pre-procedure): Awake and Alert Pre-procedure Verification/Time Out Yes - 09:58 Taken: Start Time: 09:59 Pain Control: Lidocaine 4% T opical Solution T Area Debrided (L x W): otal 1 (cm) x 1 (cm) = 1 (cm) Tissue and other material debrided: Viable, Non-Viable, Callus, Subcutaneous, Skin: Dermis , Skin: Epidermis Level: Skin/Subcutaneous Tissue Debridement Description: Excisional Instrument: Curette Bleeding: Minimum Hemostasis Achieved: Pressure End Time: 10:00 Procedural Pain: 0 Post Procedural Pain: 0 Response to Treatment: Procedure was tolerated well Level of Consciousness (Post- Awake and Alert procedure): Post Debridement Measurements of Total Wound Length: (cm) 0.6 Width: (cm) 0.7 Depth: (cm) 0.2 Volume: (cm) 0.066 Character of Wound/Ulcer Post Debridement: Improved Post Procedure Diagnosis Same as Pre-procedure Electronic Signature(s) Signed: 04/11/2021 4:50:31 PM By: Linton Ham MD Signed: 04/11/2021 5:16:10 PM By: Deon Pilling Entered By: Linton Ham on 04/11/2021 10:19:11 -------------------------------------------------------------------------------- HPI Details Patient Name: [name]: [name] [name] 04/11/2021 9:15 A M Medical Record Number: [id number] Patient Account Number: [id number] Date of Birth/Sex: Treating RN: 12-Jan-1941 (80 y.o. [name] Primary Care Provider: [name] Other Clinician: Referring Provider: Treating Provider/Extender: [name] Weeks in Treatment: 9 History of Present Illness HPI Description: ADMISSION 02/07/2021 This is a 80 year old man who is not a diabetic "prediabetic" who comes in today letter asking [place] to look at an area on his right first toe tip that has been there about a year. He has been getting this dressed that [employer] and he had Xeroform on this with gauze. The patient is not exactly sure how this came about but he is not able to dress the wound himself because of limitations due to what sounds like spinal stenosis and pain. He had a wound on the ankle as well but that healed over. He was seen by vascular in May 2021. This was related to a right great toe wound. He had an angiogram that was completely normal on both sides good perfusion right into the feet. He also looks like he has lymphedema and he has compression stockings but he cannot get them on and off so he essentially leaves he is on even when he showering Past medical history includes prediabetes, low back pain secondary I think the lumbar spinal stenosis has been offered a decompressive laminectomy but he has not gone forth with this. 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 ABI was noncompressible in our clinic on the right right first toe. 4/7; patient I admitted the clinic last week. He has an area on the tip of his right first toe. We cleaned this off last week and have been using silver collagen. He is in the same crocs today that he wore last week and I told him not to use. For 1 reason or another he could not handle a surgical sandal. 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 He is not a diabetic 4/12; right first toe  wound chronic. We have been using silver collagen. Once again he comes in then the  same pair of old crocs that he comes in every week. 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 His x-ray suggested suspicious for osteomyelitis with bone irregularity at the tuft of the right great toe 4/21; patient has been using silver collagen every other day to the wound on his right great toe. He continues to wear crocs. He has no complaints or issues today. 5/5; 2-week follow-up. 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 Mild soft tissue edema with enhancement at the distal aspect of the great toe suggestive of cellulitis. Fortunately the wound actually looks better. Has been using Hydrofera Blue 5/12; started him on Augmentin last week 875 twice daily he is tolerating this well. Last creatinine I see is 1.77 in early April. We are using Hydrofera Blue to the wound which actually looks better today. 5/17; tolerating the Augmentin well. He says the wound is actually been hurting him episodically. Actually the surface of this looks quite good. He did not get his lab work done. We are using [facility] he is changing this himself 6/2; still taking Augmentin he should be rounding into his last week. This was empirically for underlying osteomyelitis. The area on the tip of his toe is still open still with some depth but no palpable bone. We have been using Hydrofera Blue. Surface area of the wound has not been making much improvement Is work was essentially unremarkable except for a creatinine of 1.79. Reason for his renal insufficiency is not clear he is not a diabetic does not have hypertension however he does take NSAIDs and Lasix I wonder if this is the issue here. Note that his creatinine on 11/12/2018 was 1.45. Platelet count slightly low at 143 however his inflammatory markers were really in the normal range at 10 and 2 sedimentation rate and C-reactive protein respectively. Electronic  Signature(s) Signed: 04/11/2021 4:50:31 PM By: Linton Ham MD Entered By: Linton Ham on 04/11/2021 10:22:27 -------------------------------------------------------------------------------- Physical Exam Details Patient Name: [name]: [name] [name] 04/11/2021 9:15 A M Medical Record Number: [id number] Patient Account Number: [id number] Date of Birth/Sex: Treating RN: Oct 11, 1941 (80 y.o. [name] Primary Care Provider: [name] Other Clinician: Referring Provider: Treating Provider/Extender: [name] Weeks in Treatment: 9 Notes Wound exam; tip of the right great toe not particularly healthy looking today. I removed skin subcutaneous tissue around the wound margin also some debris on the surface. Hemostasis with direct pressure. There is no palpable bone no obvious surrounding infection. Electronic Signature(s) Signed: 04/11/2021 4:50:31 PM By: Linton Ham MD Entered By: Linton Ham on 04/11/2021 10:20:41 -------------------------------------------------------------------------------- Physician Orders Details Patient Name: [name]: [name] [name] 04/11/2021 9:15 A M Medical Record Number: [id number] Patient Account Number: [id number] Date of Birth/Sex: Treating RN: February 20, 1941 (80 y.o. 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 - Dr. Dellia Nims Thursday Return A Nurse Visit: - Monday or Tuesday Bathing/  Shower/ Hygiene May shower with protection  but do not get wound dressing(s) wet. - use a cast protector. Edema Control - Lymphedema / SCD / Other Elevate legs to the level of the heart or above for 30 minutes daily and/or when sitting, a frequency of: - throughout the day. Avoid standing for long periods of time. Exercise regularly Moisturize legs daily. - both legs every night before bed. Off-Loading Open toe surgical shoe to: - wound center clinic to provide surgical shoe to patient to wear while walking and standing to right foot. Other: - Stop wearing the Croc slip on shoes. 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 Ensure no pressure of on the tip right great toe. Additional Orders / Instructions Other: - continue to take oral antibiotics. Wound Treatment Wound #1 - T Great oe Wound Laterality: Right Cleanser: Wound Cleanser Every Other Day Discharge Instructions: Cleanse the wound with wound cleanser prior to applying a clean dressing using gauze sponges, not tissue or cotton balls. Prim Dressing: PolyMem Silver Non-Adhesive Dressing, 4.25x4.25 in Every Other Day ary Discharge Instructions: Apply to wound bed as instructed Secondary Dressing: Woven Gauze Sponges 2x2 in Every Other Day Discharge Instructions: Apply over primary dressing as directed. Secured With: Child psychotherapist, Sterile 2x75 (in/in) Every Other Day Discharge Instructions: Secure with stretch gauze as directed. Electronic Signature(s) Signed: 04/11/2021 4:50:31 PM By: Linton Ham MD Signed: 04/11/2021 5:16:10 PM By: Deon Pilling Entered By: Deon Pilling on 04/11/2021 10:07:03 -------------------------------------------------------------------------------- Problem List Details Patient Name: [name]: [name] [name] 04/11/2021 9:15 A M Medical Record Number: [id number] Patient Account Number: [id number] Date of Birth/Sex: Treating RN: February 22, 1941 (80 y.o. 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 04/11/2021 9:15 A M Medical Record Number: [id number] Patient Account Number: [id number] Date of Birth/Sex: Treating RN: [birth year] (80 y.o. [name] Primary Care Provider: [name] Other Clinician: Referring Provider: Treating Provider/Extender: [name] Weeks in Treatment: 9 Subjective History of Present Illness (HPI) ADMISSION 02/07/2021 This is a 80 year old man who is not a diabetic "prediabetic" who comes in today letter asking [place] to look at an area on his right first toe tip that has been there about a year. He has been getting this dressed that [employer] and he had Xeroform on this with gauze. The patient is not exactly sure how this came about but he is not able to dress the wound himself because of limitations due to what sounds like spinal stenosis and pain. He had a wound on the ankle as well but that healed over. He was seen by vascular in May 2021. This was related to a right great toe wound. He had an angiogram that was completely normal on both sides good perfusion right into the feet. He also looks like he has lymphedema and he has compression stockings but he cannot get  them on and off  so he essentially leaves he is on even when he showering Past medical history includes prediabetes, low back pain secondary I think the lumbar spinal stenosis has been offered a decompressive laminectomy but he has not gone forth with this. 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 ABI was noncompressible in our clinic on the right right first toe. 4/7; patient I admitted the clinic last week. He has an area on the tip of his right first toe. We cleaned this off last week and have been using silver collagen. He is in the same crocs today that he wore last week and I told him not to use. For 1 reason or another he could not handle a surgical sandal. 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 He is not a diabetic 4/12; right first toe wound chronic. We have been using silver collagen. Once again he comes in then the same pair of old crocs that he comes in every week. 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 His x-ray suggested suspicious for osteomyelitis with bone irregularity at the tuft of the right great toe 4/21; patient has been using silver collagen every other day to the wound on his right great toe. He continues to wear crocs. He has no complaints or issues today. 5/5; 2-week follow-up. 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 Mild soft tissue edema with enhancement at the distal aspect of the great toe suggestive of cellulitis. Fortunately the wound actually looks better. Has been using Hydrofera Blue 5/12; started him on Augmentin last week 875 twice daily he is tolerating this well. Last creatinine I see is 1.77 in early April. We are using Hydrofera Blue to the wound which actually looks better today. 5/17; tolerating the Augmentin well. He says the wound is actually been  hurting him episodically. Actually the surface of this looks quite good. He did not get his lab work done. We are using [facility] he is changing this himself 6/2; still taking Augmentin he should be rounding into his last week. This was empirically for underlying osteomyelitis. The area on the tip of his toe is still open still with some depth but no palpable bone. We have been using Hydrofera Blue. Surface area of the wound has not been making much improvement Objective Constitutional Vitals Time Taken: 9:42 AM, Height: 69 in, Weight: 250 lbs, BMI: 36.9, Temperature: 97.8 F, Pulse: 79 bpm, Respiratory Rate: 20 breaths/min, Blood Pressure: 133/68 mmHg. Integumentary (Hair, Skin) Wound #1 status is Open. Original cause of wound was Other Lesion. The date acquired was: 01/10/2020. The wound has been in treatment 9 weeks. The wound is located on the Right T Great. The wound measures 0.6cm length x 0.7cm width x 0.2cm depth; 0.33cm^2 area and 0.066cm^3 volume. There is Fat Layer oe (Subcutaneous Tissue) exposed. There is no tunneling or undermining noted. There is a medium amount of serosanguineous drainage noted. The wound margin is well defined and not attached to the wound base. There is large (67-100%) red, pink granulation within the wound bed. There is no necrotic tissue within the wound bed. 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 There was a Excisional Skin/Subcutaneous Tissue oe Debridement with a total area of 1 sq cm performed by Ricard Dillon., MD. With the following instrument(s): Curette to remove Viable and Non-Viable tissue/material. Material removed includes  Callus, Subcutaneous Tissue, Skin: Dermis, and Skin: Epidermis after achieving pain control using Lidocaine 4% T opical Solution. A time out was conducted at 09:58, prior to the start of the procedure. A Minimum amount of bleeding was controlled with Pressure. The procedure was tolerated well with a pain level of 0 throughout and a pain level of 0 following the procedure. Post Debridement Measurements: 0.6cm length x 0.7cm width x 0.2cm depth; 0.066cm^3 volume. Character of Wound/Ulcer Post Debridement is improved. Post procedure Diagnosis Wound #1: Same as Pre-Procedure Plan Follow-up Appointments: Return Appointment in 1 week. - Dr. Dellia Nims Thursday Nurse Visit: - Monday or Tuesday Bathing/ Shower/ Hygiene: May shower with protection but do not get wound dressing(s) wet. - use a cast protector. Edema Control - Lymphedema / SCD / Other: Elevate legs to the level of the heart or above for 30 minutes daily and/or when sitting, a frequency of: - throughout the day. Avoid standing for long periods of time. Exercise regularly Moisturize legs daily. - both legs every night before bed. Off-Loading: Open toe surgical shoe to: - wound center clinic to provide surgical shoe to patient to wear while walking and standing to right foot. Other: - Stop wearing the Croc slip on shoes. 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 Ensure no pressure of on the tip right great toe. Additional Orders / Instructions: Other: - continue to take oral antibiotics. WOUND #1: - T Great Wound Laterality: Right oe Cleanser: Wound Cleanser Every Other Day/ Discharge Instructions: Cleanse the wound with wound cleanser prior to applying a clean dressing using gauze sponges, not tissue or cotton balls. Prim Dressing: PolyMem Silver Non-Adhesive Dressing, 4.25x4.25 in Every Other Day/ ary Discharge Instructions: Apply to wound bed as instructed Secondary Dressing: Woven Gauze Sponges 2x2 in Every Other  Day/ Discharge Instructions: Apply over primary dressing as directed. Secured With: Child psychotherapist, Sterile 2x75 (in/in) Every Other Day/ Discharge Instructions: Secure with stretch gauze as directed. 1. I change the primary dressing to polymen 2. Should be completing his Augmentin 3. I do not know that he is adequately offloading this toe and the foot where he wears. 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 04/11/2021 Medical Record Number: [id number] Patient Account Number: [id number] Date of Birth/Sex: Treating RN: October 24, 1941 (80 y.o. 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, lumbar region with neurogenic claudication M86.171 Other acute osteomyelitis, right ankle and foot Facility Procedures CPT4 Code: [id number] Description: 11042 - DEB SUBQ TISSUE 20 SQ CM/< ICD-10 Diagnosis Description L97.518 Non-pressure chronic ulcer of other part of right foot with other specified sev Modifier: erity Quantity: 1 Physician Procedures : CPT4 Code Description Modifier [id number] 11042 - WC PHYS SUBQ TISS 20 SQ CM ICD-10 Diagnosis Description L97.518 Non-pressure chronic ulcer of other part of right foot  with other specified severity Quantity: 1 Electronic Signature(s) Signed: 04/11/2021 4:50:31 PM By: Linton Ham MD Signed: 04/11/2021 5:16:10 PM By: Deon Pilling Entered By: Deon Pilling on 04/11/2021 10:21:30

## 2021-04-12 NOTE — Progress Notes (Signed)
[name], Hanson ([id number]) Visit Report for 04/11/2021 Arrival Information Details Patient Name: [name]: [name], [name] [name] 04/11/2021 9:15 A M Medical Record Number: [id number] Patient Account Number: [id number] Date of Birth/Sex: Treating RN: [birth year] (80 y.o. [name] Primary Care Corbin Falck: [name] Other Clinician: Referring Kayd Launer: Treating Jalisha Enneking/Extender: [name] Weeks in Treatment: 9 Visit Information History Since Last Visit Added or deleted any medications: No Patient Arrived: Ambulatory Any new allergies or adverse reactions: No Arrival Time: 09:40 Had a fall or experienced change in No Transfer Assistance: None activities of daily living that may affect Patient Identification Verified: Yes risk of falls: Secondary Verification Process Completed: Yes Signs or symptoms of abuse/neglect since last visito No Patient Requires Transmission-Based Precautions: No Hospitalized since last visit: No Patient Has Alerts: Yes Implantable device outside of the clinic excluding No Patient Alerts: R ABI non compressible cellular tissue based products placed in the center since last visit: Has Dressing in Place as Prescribed: Yes Pain Present Now: No Electronic Signature(s) Signed: 04/11/2021 5:54:05 PM By: Lorrin Jackson Entered By: Lorrin Jackson on 04/11/2021 09:42:27 -------------------------------------------------------------------------------- Encounter Discharge Information Details Patient Name: [name]: [name] [name] 04/11/2021 9:15 A M Medical Record Number: [id number] Patient Account Number: [id number] Date of Birth/Sex: Treating RN: 1941-04-28 (80 y.o. Ernestene Mention Primary Care Jaydalee Bardwell: [name] Other Clinician: Referring Selwyn Reason: Treating Qiara Minetti/Extender: [name] Weeks in Treatment: 9 Encounter Discharge Information Items Post Procedure Vitals Discharge  Condition: Stable Temperature (F): 97.8 Ambulatory Status: Ambulatory Pulse (bpm): 79 Discharge Destination: Home Respiratory Rate (breaths/min): 18 Transportation: Private Auto Blood Pressure (mmHg): 133/68 Accompanied By: self Schedule Follow-up Appointment: Yes Clinical Summary of Care: Patient Declined Electronic Signature(s) Signed: 04/11/2021 5:38:56 PM By: Baruch Gouty RN, BSN Entered By: Baruch Gouty on 04/11/2021 10:26:25 -------------------------------------------------------------------------------- Lower Extremity Assessment Details Patient Name: [name]: [name] [name] 04/11/2021 9:15 A M Medical Record Number: [id number] Patient Account Number: [id number] Date of Birth/Sex: Treating RN: 01-09-1941 (80 y.o. [name] Primary Care Matheu Ploeger: [name] Other Clinician: Referring Toya Palacios: Treating Staceyann Knouff/Extender: [name] Weeks in Treatment: 9 Edema Assessment Assessed: [name]: No] [name]: Yes] Edema: [Left: Ye] [Right: s] Calf Left: Right: Point of Measurement: 33 cm From Medial Instep 33 cm Ankle Left: Right: Point of Measurement: 10 cm From Medial Instep 24 cm Vascular Assessment Pulses: Dorsalis Pedis Palpable: [Right:Yes] Electronic Signature(s) Signed: 04/11/2021 5:54:05 PM By: Lorrin Jackson Entered By: Lorrin Jackson on 04/11/2021 09:52:33 -------------------------------------------------------------------------------- Multi Wound Chart Details Patient Name: [name]: [name] [name] 04/11/2021 9:15 A M Medical Record Number: [id number] Patient Account Number: [id number] Date of Birth/Sex: Treating RN: [birth year] (80 y.o. 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] [N/A:N/A] Primary Etiology: [1:Cataracts, Chronic sinus] [N/A:N/A] Comorbid History: [1:problems/congestion, Asthma, Sleep Apnea, Hypertension, Peripheral Venous Disease, Osteoarthritis 01/10/2020] [N/A:N/A] Date Acquired: [1:9] [N/A:N/A] Weeks of Treatment: [1:Open] [N/A:N/A] Wound Status: [1:0.6x0.7x0.2] [N/A:N/A] Measurements L x W x D (cm) [1:0.33] [N/A:N/A] A (cm) : rea [1:0.066] [N/A:N/A] Volume (cm) : [1:65.00%] [N/A:N/A] % Reduction in Area: [1:64.90%] [N/A:N/A] % Reduction in Volume: [1:Full Thickness Without  Exposed] [N/A:N/A] Classification: [1:Support Structures Medium] [N/A:N/A] Exudate A mount: [1:Serosanguineous] [N/A:N/A] Exudate Type: [1:red, brown] [N/A:N/A] Exudate Color: [1:Well defined, not attached] [N/A:N/A] Wound Margin: [1:Large (67-100%)] [N/A:N/A] Granulation A mount: [1:Red, Pink] [N/A:N/A] Granulation Quality: [1:None Present (0%)] [N/A:N/A] Necrotic A mount: [1:Fat Layer (Subcutaneous Tissue): Yes N/A] Exposed Structures: [1:Fascia: No Tendon: No Muscle: No Joint: No Bone: No Medium (34-66%)] [N/A:N/A] Epithelialization: [1:Debridement - Selective/Open Wound N/A] Debridement: Pre-procedure Verification/Time Out 09:58 [N/A:N/A] Taken: [1:Lidocaine 4% Topical Solution] [N/A:N/A] Pain Control: [1:Callus] [N/A:N/A] Tissue Debrided: [1:Skin/Epidermis] [N/A:N/A] Level: [1:1] [N/A:N/A] Debridement A (sq cm): [1:rea Curette] [N/A:N/A] Instrument: [1:Minimum] [N/A:N/A] Bleeding: [1:Pressure] [N/A:N/A] Hemostasis A chieved: [1:0] [N/A:N/A] Procedural Pain: [1:0] [N/A:N/A] Post Procedural Pain: [1:Procedure was tolerated well] [N/A:N/A] Debridement Treatment Response: [1:0.6x0.7x0.2] [N/A:N/A] Post Debridement Measurements  L x W x D (cm) [1:0.066] [N/A:N/A] Post Debridement Volume: (cm) [1:Debridement] [N/A:N/A] Treatment Notes Electronic Signature(s) Signed: 04/11/2021 4:50:31 PM By: Linton Ham MD Signed: 04/11/2021 5:16:10 PM By: Deon Pilling Entered By: Linton Ham on 04/11/2021 10:17:27 -------------------------------------------------------------------------------- Multi-Disciplinary Care Plan Details Patient Name: [name]: [name] [name] 04/11/2021 9:15 A M Medical Record Number: [id number] Patient Account Number: [id number] Date of Birth/Sex: Treating RN: 1941-07-17 (80 y.o. [name], [name] Primary Care Pluma Diniz: [name] Other Clinician: Referring Deontrae Drinkard: Treating Lanea Vankirk/Extender: [name] Weeks in Treatment: 9 Active Inactive Necrotic Tissue Nursing Diagnoses: Impaired tissue integrity related to necrotic/devitalized tissue Knowledge deficit related to management of necrotic/devitalized tissue Goals: Necrotic/devitalized tissue will be minimized in the wound bed Date Initiated: 02/07/2021 Target Resolution Date: 04/24/2021 Goal Status: Active Patient/caregiver will verbalize understanding of reason and process for debridement of necrotic tissue Date Initiated: 02/07/2021 Target Resolution Date: 05/10/2021 Goal Status: Active Interventions: Assess patient pain level pre-, during and post procedure and prior to discharge Provide education on necrotic tissue and debridement process Treatment Activities: T ordered outside of clinic : 02/07/2021 est Notes: Osteomyelitis Nursing Diagnoses: Infection: osteomyelitis Goals: Diagnostic evaluation for osteomyelitis completed as ordered Date Initiated: 03/14/2021 Target Resolution Date: 05/10/2021 Goal Status: Active Signs and symptoms for osteomyelitis will be recognized and promptly addressed Date Initiated: 03/14/2021 Target Resolution Date: 05/10/2021 Goal Status:  Active Interventions: Assess for signs and symptoms of osteomyelitis resolution every visit Provide education on osteomyelitis Screen for HBO Treatment Activities: MRI : 03/13/2021 Systemic antibiotics : 03/14/2021 Notes: Electronic Signature(s) Signed: 04/11/2021 5:16:10 PM By: Deon Pilling Entered By: Deon Pilling on 04/11/2021 09:46:01 -------------------------------------------------------------------------------- Pain Assessment Details Patient Name: [name]: [name] [name] 04/11/2021 9:15 A M Medical Record Number: [id number] Patient Account Number: [id number] Date of Birth/Sex: Treating RN: [birth year] (80 y.o. [name] Primary Care Traveion Ruddock: [name] Other Clinician: Referring Lavonte Palos: Treating Kasy Iannacone/Extender: [name] Weeks in Treatment: 9 Active Problems Location of Pain Severity and Description of Pain Patient Has Paino No Site Locations Pain Management and Medication Current Pain Management: Electronic Signature(s) Signed: 04/11/2021 5:54:05 PM By: Lorrin Jackson Entered By: Lorrin Jackson on 04/11/2021 09:45:35 -------------------------------------------------------------------------------- Patient/Caregiver Education Details Patient Name: [name]: [name] [name] 6/2/2022andnbsp9:15 A M Medical Record Number: [id number] Patient Account Number: [id number] Date of Birth/Gender: Treating RN: 20-Feb-1941 (80 y.o. [name] Primary Care Physician: [name] Other Clinician: Referring Physician: Treating Physician/Extender: Leonard Downing in Treatment: 9 Education Assessment Education Provided To: Patient Education Topics Provided Infection: Handouts: Infection Prevention and Management Methods: Explain/Verbal Responses: Reinforcements needed Electronic Signature(s) Signed: 04/11/2021 5:16:10 PM By: Deon Pilling Entered By:  Deon Pilling on 04/11/2021  09:46:10 -------------------------------------------------------------------------------- Wound Assessment Details Patient Name: [name]: [name], [name] [name] 04/11/2021 9:15 A M Medical Record Number: [id number] Patient Account Number: [id number] Date of Birth/Sex: Treating RN: [birth year] (80 y.o. [name] Primary Care Lachell Rochette: [name] Other Clinician: Referring Kiamesha Samet: Treating Latifah Padin/Extender: [name] Weeks in Treatment: 9 Wound Status Wound Number: 1 Primary Neuropathic Ulcer-Non Diabetic Etiology: Wound Location: Right T Great oe Wound Open Wounding Event: Other Lesion Status: Date Acquired: 01/10/2020 Comorbid Cataracts, Chronic sinus problems/congestion, Asthma, Sleep Weeks Of Treatment: 9 History: Apnea, Hypertension, Peripheral Venous Disease, Osteoarthritis Clustered Wound: No Photos Wound Measurements Length: (cm) 0.6 Width: (cm) 0.7 Depth: (cm) 0.2 Area: (cm) 0.33 Volume: (cm) 0.066 % Reduction in Area: 65% % Reduction in Volume: 64.9% Epithelialization: Medium (34-66%) Tunneling: No Undermining: No Wound Description Classification: Full Thickness Without Exposed Support Structures Wound Margin: Well defined, not attached Exudate Amount: Medium Exudate Type: Serosanguineous Exudate Color: red, brown Foul Odor After Cleansing: No Slough/Fibrino No Wound Bed Granulation Amount: Large (67-100%) Exposed Structure Granulation Quality: Red, Pink Fascia Exposed: No Necrotic Amount: None Present (0%) Fat Layer (Subcutaneous Tissue) Exposed: Yes Tendon Exposed: No Muscle Exposed: No Joint Exposed: No Bone Exposed: No Treatment Notes Wound #1 (Toe Great) Wound Laterality: Right Cleanser Wound Cleanser Discharge Instruction: Cleanse the wound with wound cleanser prior to applying a clean dressing using gauze sponges, not tissue or cotton balls. Peri-Wound Care Topical Primary Dressing PolyMem Silver  Non-Adhesive Dressing, 4.25x4.25 in Discharge Instruction: Apply to wound bed as instructed Secondary Dressing Woven Gauze Sponges 2x2 in Discharge Instruction: Apply over primary dressing as directed. Secured With Conforming Stretch Gauze Bandage, Sterile 2x75 (in/in) Discharge Instruction: Secure with stretch gauze as directed. Compression Wrap Compression Stockings Add-Ons Electronic Signature(s) Signed: 04/11/2021 5:54:05 PM By: Lorrin Jackson Signed: 04/12/2021 5:48:41 PM By: Sandre Kitty Entered By: Sandre Kitty on 04/11/2021 16:43:17 -------------------------------------------------------------------------------- Vitals Details Patient Name: [name]: [name] [name] 04/11/2021 9:15 A M Medical Record Number: [id number] Patient Account Number: [id number] Date of Birth/Sex: Treating RN: 1941/01/10 (80 y.o. [name] Primary Care Miakoda Mcmillion: [name] Other Clinician: Referring Aisley Whan: Treating Alva Broxson/Extender: [name] Weeks in Treatment: 9 Vital Signs Time Taken: 09:42 Temperature (F): 97.8 Height (in): 69 Pulse (bpm): 79 Weight (lbs): 250 Respiratory Rate (breaths/min): 20 Body Mass Index (BMI): 36.9 Blood Pressure (mmHg): 133/68 Reference Range: 80 - 120 mg / dl Electronic Signature(s) Signed: 04/11/2021 5:54:05 PM By: Lorrin Jackson Entered By: Lorrin Jackson on 04/11/2021 09:44:54

## 2021-04-16 ENCOUNTER — Other Ambulatory Visit: Payer: Self-pay

## 2021-04-16 ENCOUNTER — Encounter (HOSPITAL_BASED_OUTPATIENT_CLINIC_OR_DEPARTMENT_OTHER): Payer: Medicare Other | Admitting: Internal Medicine

## 2021-04-16 DIAGNOSIS — L97518 Non-pressure chronic ulcer of other part of right foot with other specified severity: Secondary | ICD-10-CM | POA: Diagnosis not present

## 2021-04-16 DIAGNOSIS — M48062 Spinal stenosis, lumbar region with neurogenic claudication: Secondary | ICD-10-CM | POA: Diagnosis not present

## 2021-04-16 DIAGNOSIS — M86171 Other acute osteomyelitis, right ankle and foot: Secondary | ICD-10-CM | POA: Diagnosis not present

## 2021-04-17 NOTE — Progress Notes (Signed)
[name] ([id number]) Visit Report for 04/16/2021 Arrival Information Details Patient Name: [name]: [name] [name] 04/16/2021 10:30 A M Medical Record Number: [id number] Patient Account Number: [id number] Date of Birth/Sex: Treating RN: 02-04-1941 (80 y.o. [name] Primary Care Pebble Botkin: [name] Other Clinician: Referring Litisha Guagliardo: Treating Adrianah Prophete/Extender: [name] Weeks in Treatment: 9 Visit Information History Since Last Visit Added or deleted any medications: No Patient Arrived: Ambulatory Any new allergies or adverse reactions: No Arrival Time: 10:43 Had a fall or experienced change in No Accompanied By: self activities of daily living that may affect Transfer Assistance: None risk of falls: Patient Identification Verified: Yes Signs or symptoms of abuse/neglect since last visito No Secondary Verification Process Completed: Yes Hospitalized since last visit: No Patient Requires Transmission-Based Precautions: No Implantable device outside of the clinic excluding No Patient Has Alerts: Yes cellular tissue based products placed in the center Patient Alerts: R ABI non compressible since last visit: Has Dressing in Place as Prescribed: Yes Has Compression in Place as Prescribed: Yes Pain Present Now: No Electronic Signature(s) Signed: 04/17/2021 6:12:56 PM By: Baruch Gouty RN, BSN Entered By: Baruch Gouty on 04/16/2021 10:46:48 -------------------------------------------------------------------------------- Clinic Level of Care Assessment Details Patient Name: [name]: [name] Banker [name] 04/16/2021 10:30 A M Medical Record Number: [id number] Patient Account Number: [id number] Date of Birth/Sex: Treating RN: 05-09-1941 (80 y.o. [name] Primary Care Tresea Heine: [name] Other Clinician: Referring Galvin Aversa: Treating Lorenzo Pereyra/Extender: [name] Weeks in Treatment:  9 Clinic Level of Care Assessment Items TOOL 4 Quantity Score []  - 0 Use when only an EandM is performed on FOLLOW-UP visit ASSESSMENTS - Nursing Assessment / Reassessment X- 1 10 Reassessment of Co-morbidities (includes updates in patient status) X- 1 5 Reassessment of Adherence to Treatment Plan ASSESSMENTS - Wound and Skin A ssessment / Reassessment X - Simple Wound Assessment / Reassessment - one wound 1 5 []  - 0 Complex Wound Assessment / Reassessment - multiple wounds []  - 0 Dermatologic / Skin Assessment (not related to wound area) ASSESSMENTS - Focused Assessment []  - 0 Circumferential Edema Measurements - multi extremities []  - 0 Nutritional Assessment / Counseling / Intervention []  - 0 Lower Extremity Assessment (monofilament, tuning fork, pulses) []  - 0 Peripheral Arterial Disease Assessment (using hand held doppler) ASSESSMENTS - Ostomy and/or Continence Assessment and Care []  - 0 Incontinence Assessment and Management []  - 0 Ostomy Care Assessment and Management (repouching, etc.) PROCESS - Coordination of Care X - Simple Patient / Family Education for ongoing care 1 15 []  - 0 Complex (extensive) Patient / Family Education for ongoing care X- 1 10 Staff obtains Programmer, systems, Records, T Results / Process Orders est []  - 0 Staff telephones HHA, Nursing Homes / Clarify orders / etc []  - 0 Routine Transfer to another Facility (non-emergent condition) []  - 0 Routine Hospital Admission (non-emergent condition) []  - 0 New Admissions / Biomedical engineer / Ordering NPWT Apligraf, etc. , []  - 0 Emergency Hospital Admission (emergent condition) X- 1 10 Simple Discharge Coordination []  - 0 Complex (extensive) Discharge Coordination PROCESS - Special Needs []  - 0 Pediatric / Minor Patient Management []  - 0 Isolation Patient Management []  - 0 Hearing / Language / Visual special needs []  - 0 Assessment of Community assistance (transportation, D/C planning,  etc.) []  - 0 Additional assistance / Altered mentation []  - 0 Support Surface(s) Assessment (bed, cushion, seat, etc.) INTERVENTIONS - Wound Cleansing / Measurement  X - Simple Wound Cleansing - one wound 1 5 []  - 0 Complex Wound Cleansing - multiple wounds []  - 0 Wound Imaging (photographs - any number of wounds) []  - 0 Wound Tracing (instead of photographs) []  - 0 Simple Wound Measurement - one wound []  - 0 Complex Wound Measurement - multiple wounds INTERVENTIONS - Wound Dressings X - Small Wound Dressing one or multiple wounds 1 10 []  - 0 Medium Wound Dressing one or multiple wounds []  - 0 Large Wound Dressing one or multiple wounds X- 1 5 Application of Medications - topical []  - 0 Application of Medications - injection INTERVENTIONS - Miscellaneous []  - 0 External ear exam []  - 0 Specimen Collection (cultures, biopsies, blood, body fluids, etc.) []  - 0 Specimen(s) / Culture(s) sent or taken to Lab for analysis []  - 0 Patient Transfer (multiple staff / Civil Service fast streamer / Similar devices) []  - 0 Simple Staple / Suture removal (25 or less) []  - 0 Complex Staple / Suture removal (26 or more) []  - 0 Hypo / Hyperglycemic Management (close monitor of Blood Glucose) []  - 0 Ankle / Brachial Index (ABI) - do not check if billed separately X- 1 5 Vital Signs Has the patient been seen at the hospital within the last three years: Yes Total Score: 80 Level Of Care: New/Established - Level 3 Electronic Signature(s) Signed: 04/17/2021 6:12:56 PM By: Baruch Gouty RN, BSN Entered By: Baruch Gouty on 04/16/2021 10:59:35 -------------------------------------------------------------------------------- Encounter Discharge Information Details Patient Name: [name]: [name] Banker [name] 04/16/2021 10:30 A M Medical Record Number: [id number] Patient Account Number: [id number] Date of Birth/Sex: Treating RN: June 02, 1941 (80 y.o. [name] Primary Care Xai Frerking: [name] Other Clinician: Referring Leoma Folds: Treating Jayliana Valencia/Extender: [name] Weeks in Treatment: 9 Encounter Discharge Information Items Discharge Condition: Stable Ambulatory Status: Ambulatory Discharge Destination: Home Transportation: Private Auto Accompanied By: self Schedule Follow-up Appointment: Yes Clinical Summary of Care: Patient Declined Electronic Signature(s) Signed: 04/17/2021 6:12:56 PM By: Baruch Gouty RN, BSN Entered By: Baruch Gouty on 04/16/2021 11:00:20 -------------------------------------------------------------------------------- Patient/Caregiver Education Details Patient Name: [name]: [name] Banker [name] 6/7/2022andnbsp10:30 A M Medical Record Number: [id number] Patient Account Number: [id number] Date of Birth/Gender: Treating RN: 08-03-1941 (79 y.o. [name] Primary Care Physician: [name] Other Clinician: Referring Physician: Treating Physician/Extender: Leonard Downing in Treatment: 9 Education Assessment Education Provided To: Patient Education Topics Provided Wound Debridement: Methods: Explain/Verbal Responses: Reinforcements needed, State content correctly Electronic Signature(s) Signed: 04/17/2021 6:12:56 PM By: Baruch Gouty RN, BSN Entered By: Baruch Gouty on 04/16/2021 11:00:06 -------------------------------------------------------------------------------- Wound Assessment Details Patient Name: [name]: [name] Banker [name] 04/16/2021 10:30 A M Medical Record Number: [id number] Patient Account Number: [id number] Date of Birth/Sex: Treating RN: [birth year] (80 y.o. [name] Primary Care Nadiyah Zeis: [name] Other Clinician: Referring Danamarie Minami: Treating Torrance Stockley/Extender: [name] Weeks in Treatment: 9 Wound Status Wound Number: 1 Primary Neuropathic Ulcer-Non Diabetic Etiology: Wound Location: Right T  Great oe Wound Open Wounding Event: Other Lesion Status: Date Acquired: 01/10/2020 Comorbid Cataracts, Chronic sinus problems/congestion, Asthma, Sleep Weeks Of Treatment: 9 History: Apnea, Hypertension, Peripheral Venous Disease, Osteoarthritis Clustered Wound: No Wound Measurements Length: (cm) 0.6 Width: (cm) 0.7 Depth: (cm) 0.2 Area: (cm) 0.33 Volume: (cm) 0.066 % Reduction in Area: 65% % Reduction in Volume: 64.9% Epithelialization: Small (1-33%) Tunneling: No Undermining: No Wound Description Classification: Full Thickness With Exposed Support Structures Wound Margin: Distinct, outline  attached Exudate Amount: Medium Exudate Type: Serosanguineous Exudate Color: red, brown Foul Odor After Cleansing: No Slough/Fibrino No Wound Bed Granulation Amount: Large (67-100%) Exposed Structure Granulation Quality: Red Fascia Exposed: No Necrotic Amount: None Present (0%) Fat Layer (Subcutaneous Tissue) Exposed: Yes Tendon Exposed: No Muscle Exposed: No Joint Exposed: No Bone Exposed: Yes Treatment Notes Wound #1 (Toe Great) Wound Laterality: Right Cleanser Wound Cleanser Discharge Instruction: Cleanse the wound with wound cleanser prior to applying a clean dressing using gauze sponges, not tissue or cotton balls. Peri-Wound Care Topical Primary Dressing PolyMem Silver Non-Adhesive Dressing, 4.25x4.25 in Discharge Instruction: Apply to wound bed as instructed Secondary Dressing Woven Gauze Sponges 2x2 in Discharge Instruction: Apply over primary dressing as directed. Secured With Conforming Stretch Gauze Bandage, Sterile 2x75 (in/in) Discharge Instruction: Secure with stretch gauze as directed. Compression Wrap Compression Stockings Add-Ons Electronic Signature(s) Signed: 04/17/2021 6:12:56 PM By: Baruch Gouty RN, BSN Entered By: Baruch Gouty on 04/16/2021 10:58:58 -------------------------------------------------------------------------------- Vitals  Details Patient Name: [name]: [name] Banker [name] 04/16/2021 10:30 A M Medical Record Number: [id number] Patient Account Number: [id number] Date of Birth/Sex: Treating RN: [birth year] (80 y.o. [name] Primary Care Jace Fermin: [name] Other Clinician: Referring Jorden Mahl: Treating Kaithlyn Teagle/Extender: [name] Weeks in Treatment: 9 Vital Signs Time Taken: 10:46 Temperature (F): 98.7 Height (in): 69 Pulse (bpm): 82 Weight (lbs): 250 Respiratory Rate (breaths/min): 20 Body Mass Index (BMI): 36.9 Blood Pressure (mmHg): 140/66 Reference Range: 80 - 120 mg / dl Electronic Signature(s) Signed: 04/17/2021 6:12:56 PM By: Baruch Gouty RN, BSN Entered By: Baruch Gouty on 04/16/2021 10:49:11

## 2021-04-17 NOTE — Progress Notes (Signed)
[name], [name] ([id number]) Visit Report for 04/16/2021 SuperBill Details Patient Name: [name]: [name], [name] [name] 04/16/2021 Medical Record Number: [id number] Patient Account Number: [id number] Date of Birth/Sex: Treating RN: Apr 07, 1941 (80 y.o. 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

## 2021-04-18 ENCOUNTER — Encounter (HOSPITAL_BASED_OUTPATIENT_CLINIC_OR_DEPARTMENT_OTHER): Payer: Medicare Other | Admitting: Internal Medicine

## 2021-04-18 ENCOUNTER — Other Ambulatory Visit: Payer: Self-pay

## 2021-04-18 DIAGNOSIS — L97518 Non-pressure chronic ulcer of other part of right foot with other specified severity: Secondary | ICD-10-CM | POA: Diagnosis not present

## 2021-04-18 DIAGNOSIS — M86171 Other acute osteomyelitis, right ankle and foot: Secondary | ICD-10-CM | POA: Diagnosis not present

## 2021-04-18 DIAGNOSIS — L97514 Non-pressure chronic ulcer of other part of right foot with necrosis of bone: Secondary | ICD-10-CM | POA: Diagnosis not present

## 2021-04-18 DIAGNOSIS — M48062 Spinal stenosis, lumbar region with neurogenic claudication: Secondary | ICD-10-CM | POA: Diagnosis not present

## 2021-04-19 NOTE — Progress Notes (Signed)
DAVIS, AMBROSINI ([id number]) Visit Report for 04/18/2021 Arrival Information Details Patient Name: [name]: MAKIAH, CLAUSON [name] 04/18/2021 9:45 A M Medical Record Number: [id number] Patient Account Number: [id number] Date of Birth/Sex: Treating RN: [birth year] (80 y.o. [name] Primary Care Juni Glaab: [name] Other Clinician: Referring Jasey Cortez: Treating Dayon Witt/Extender: [name] Weeks in Treatment: 10 Visit Information History Since Last Visit Added or deleted any medications: No Patient Arrived: Ambulatory Any new allergies or adverse reactions: No Arrival Time: 10:29 Had a fall or experienced change in No Transfer Assistance: None activities of daily living that may affect Patient Identification Verified: Yes risk of falls: Secondary Verification Process Completed: Yes Signs or symptoms of abuse/neglect since last visito No Patient Requires Transmission-Based Precautions: No Hospitalized since last visit: No Patient Has Alerts: Yes Implantable device outside of the clinic excluding No Patient Alerts: R ABI non compressible cellular tissue based products placed in the center since last visit: Has Dressing in Place as Prescribed: Yes Pain Present Now: No Electronic Signature(s) Signed: 04/18/2021 5:42:55 PM By: Lorrin Jackson Entered By: Lorrin Jackson on 04/18/2021 10:33:01 -------------------------------------------------------------------------------- Clinic Level of Care Assessment Details Patient Name: [name]: PATSY, VARMA [name] 04/18/2021 9:45 A M Medical Record Number: [id number] Patient Account Number: [id number] Date of Birth/Sex: Treating RN: [birth year] (80 y.o. [name], [name] Primary Care Arnice Vanepps: [name] Other Clinician: Referring Dorinne Graeff: Treating Kaidence Sant/Extender: [name] Weeks in Treatment: 10 Clinic Level of Care Assessment Items TOOL 4 Quantity Score X- 1 0 Use  when only an EandM is performed on FOLLOW-UP visit ASSESSMENTS - Nursing Assessment / Reassessment X- 1 10 Reassessment of Co-morbidities (includes updates in patient status) X- 1 5 Reassessment of Adherence to Treatment Plan ASSESSMENTS - Wound and Skin A ssessment / Reassessment X - Simple Wound Assessment / Reassessment - one wound 1 5 []  - 0 Complex Wound Assessment / Reassessment - multiple wounds X- 1 10 Dermatologic / Skin Assessment (not related to wound area) ASSESSMENTS - Focused Assessment X- 1 5 Circumferential Edema Measurements - multi extremities X- 1 10 Nutritional Assessment / Counseling / Intervention []  - 0 Lower Extremity Assessment (monofilament, tuning fork, pulses) []  - 0 Peripheral Arterial Disease Assessment (using hand held doppler) ASSESSMENTS - Ostomy and/or Continence Assessment and Care []  - 0 Incontinence Assessment and Management []  - 0 Ostomy Care Assessment and Management (repouching, etc.) PROCESS - Coordination of Care X - Simple Patient / Family Education for ongoing care 1 15 []  - 0 Complex (extensive) Patient / Family Education for ongoing care X- 1 10 Staff obtains Programmer, systems, Records, T Results / Process Orders est []  - 0 Staff telephones HHA, Nursing Homes / Clarify orders / etc []  - 0 Routine Transfer to another Facility (non-emergent condition) []  - 0 Routine Hospital Admission (non-emergent condition) []  - 0 New Admissions / Biomedical engineer / Ordering NPWT Apligraf, etc. , []  - 0 Emergency Hospital Admission (emergent condition) X- 1 10 Simple Discharge Coordination []  - 0 Complex (extensive) Discharge Coordination PROCESS - Special Needs []  - 0 Pediatric / Minor Patient Management []  - 0 Isolation Patient Management []  - 0 Hearing / Language / Visual special needs []  - 0 Assessment of Community assistance (transportation, D/C planning, etc.) []  - 0 Additional assistance / Altered mentation []  - 0 Support  Surface(s) Assessment (bed, cushion, seat, etc.) INTERVENTIONS - Wound Cleansing / Measurement X - Simple Wound Cleansing - one wound 1 5 []  -  0 Complex Wound Cleansing - multiple wounds X- 1 5 Wound Imaging (photographs - any number of wounds) []  - 0 Wound Tracing (instead of photographs) X- 1 5 Simple Wound Measurement - one wound []  - 0 Complex Wound Measurement - multiple wounds INTERVENTIONS - Wound Dressings X - Small Wound Dressing one or multiple wounds 1 10 []  - 0 Medium Wound Dressing one or multiple wounds []  - 0 Large Wound Dressing one or multiple wounds []  - 0 Application of Medications - topical []  - 0 Application of Medications - injection INTERVENTIONS - Miscellaneous []  - 0 External ear exam []  - 0 Specimen Collection (cultures, biopsies, blood, body fluids, etc.) []  - 0 Specimen(s) / Culture(s) sent or taken to Lab for analysis []  - 0 Patient Transfer (multiple staff / Civil Service fast streamer / Similar devices) []  - 0 Simple Staple / Suture removal (25 or less) []  - 0 Complex Staple / Suture removal (26 or more) []  - 0 Hypo / Hyperglycemic Management (close monitor of Blood Glucose) []  - 0 Ankle / Brachial Index (ABI) - do not check if billed separately X- 1 5 Vital Signs Has the patient been seen at the hospital within the last three years: Yes Total Score: 110 Level Of Care: New/Established - Level 3 Electronic Signature(s) Signed: 04/18/2021 6:02:04 PM By: Deon Pilling Entered By: Deon Pilling on 04/18/2021 17:35:58 -------------------------------------------------------------------------------- Encounter Discharge Information Details [name] 04/18/2021 9:45 A M Medical Record Number: [id number] Patient Account Number: [id number] Date of Birth/Sex: Treating RN: 16-Nov-1940 (80 y.o. [name] Primary Care Domnick Chervenak: [name] Other Clinician: Referring Zanita Millman: Treating Iori Gigante/Extender: [name] Weeks in Treatment: 10 Encounter Discharge Information Items Discharge Condition: Stable Ambulatory Status: Ambulatory Discharge Destination: Home Transportation: Private Auto Schedule Follow-up Appointment: Yes Clinical Summary of Care: Provided on 04/18/2021 Form Type Recipient Paper Patient Patient Electronic Signature(s) Signed: 04/18/2021 11:10:51 AM By: Lorrin Jackson Entered By: Lorrin Jackson on 04/18/2021 11:10:51 -------------------------------------------------------------------------------- Lower Extremity Assessment Details [name] 04/18/2021 9:45 A M Medical Record Number: [id number] Patient Account Number: [id number] Date of Birth/Sex: Treating RN: 10/11/1941 (80 y.o. [name] Primary Care Christyn Gutkowski: [name] Other Clinician: Referring Tajon Moring: Treating Marquet Faircloth/Extender: [name] Weeks in Treatment: 10 Edema Assessment Assessed: [name]: No] [name]: Yes] Edema: [Left: Ye] [Right: s] Calf Left: Right: Point of Measurement: 33 cm From Medial Instep 37 cm Ankle Left: Right: Point of Measurement: 10 cm From Medial Instep 25 cm Vascular Assessment Pulses: Dorsalis Pedis Palpable: [Right:Yes] Electronic Signature(s) Signed: 04/18/2021 5:42:55 PM By: Lorrin Jackson Entered By: Lorrin Jackson on 04/18/2021 10:37:21 -------------------------------------------------------------------------------- Multi Wound Chart Details [name] 04/18/2021 9:45 A M Medical Record Number: [id number] Patient Account Number: [id number] Date of Birth/Sex: Treating RN: [birth year] (80 y.o. 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] [N/A:N/A] Primary Etiology: [1:Cataracts, Chronic sinus] [N/A:N/A] Comorbid History: [1:problems/congestion, Asthma, Sleep Apnea, Hypertension, Peripheral Venous Disease, Osteoarthritis 01/10/2020] [N/A:N/A] Date Acquired: [1:10] [N/A:N/A] Weeks of Treatment: [1:Open] [N/A:N/A] Wound Status: [1:0.5x0.6x0.3] [N/A:N/A] Measurements L x W x D (cm) [  1:0.236] [N/A:N/A] A (cm) : rea [1:0.071] [N/A:N/A] Volume (cm) : [1:74.90%] [N/A:N/A] % Reduction in Area: [1:62.20%] [N/A:N/A] % Reduction in Volume: [1:Full Thickness With Exposed Support N/A] Classification: [1:Structures Medium] [N/A:N/A] Exudate Amount: [1:Serosanguineous] [N/A:N/A] Exudate Type: [1:red, brown] [N/A:N/A] Exudate Color: [1:Distinct, outline attached] [N/A:N/A] Wound Margin: [1:Large (67-100%)] [N/A:N/A] Granulation Amount: [1:Red] [N/A:N/A] Granulation Quality: [1:None Present (0%)] [N/A:N/A] Necrotic Amount: [1:Fat Layer (Subcutaneous Tissue): Yes N/A] Exposed Structures: [1:Bone: Yes Fascia: No Tendon: No Muscle: No Joint: No Medium (34-66%)] [N/A:N/A] Epithelialization: [1:Calloused Periwound] [N/A:N/A] Treatment Notes Electronic Signature(s) Signed: 04/18/2021 4:38:36 PM By: Linton Ham MD Signed: 04/18/2021 6:02:04 PM By: Deon Pilling Entered By: Linton Ham on 04/18/2021 10:57:13 -------------------------------------------------------------------------------- Multi-Disciplinary Care Plan Details [name] 04/18/2021 9:45 A M Medical Record Number: [id number] Patient Account Number: [id number] Date of Birth/Sex: Treating RN: 06-29-1941 (80 y.o. [name], [name] Primary Care Semiyah Newgent: [name] Other  Clinician: Referring Keirston Saephanh: Treating Juna Caban/Extender: [name] Weeks in Treatment: 10 Active Inactive Necrotic Tissue Nursing Diagnoses: Impaired tissue integrity related to necrotic/devitalized tissue Knowledge deficit related to management of necrotic/devitalized tissue Goals: Necrotic/devitalized tissue will be minimized in the wound bed Date Initiated: 02/07/2021 Target Resolution Date: 05/02/2021 Goal Status: Active Patient/caregiver will verbalize understanding of reason and process for debridement of necrotic tissue Date Initiated: 02/07/2021 Target Resolution Date: 05/10/2021 Goal Status: Active Interventions: Assess patient pain level pre-, during and post procedure and prior to discharge Provide education on necrotic tissue and debridement process Treatment Activities: T ordered outside of clinic : 02/07/2021 est Notes: Osteomyelitis Nursing Diagnoses: Infection: osteomyelitis Goals: Diagnostic evaluation for osteomyelitis completed as ordered Date Initiated: 03/14/2021 Target Resolution Date: 05/10/2021 Goal Status: Active Signs and symptoms for osteomyelitis will be recognized and promptly addressed Date Initiated: 03/14/2021 Target Resolution Date: 05/10/2021 Goal Status: Active Interventions: Assess for signs and symptoms of osteomyelitis resolution every visit Provide education on osteomyelitis Screen for HBO Treatment Activities: MRI : 03/13/2021 Systemic antibiotics : 03/14/2021 Notes: Electronic Signature(s) Signed: 04/18/2021 6:02:04 PM By: Deon Pilling Entered By: Deon Pilling on 04/18/2021 10:51:48 -------------------------------------------------------------------------------- Pain Assessment Details [name] 04/18/2021 9:45 A M Medical Record Number: [id number] Patient Account Number: [id number] Date of Birth/Sex: Treating RN: [birth year] (80 y.o. [name] Primary Care Jesson Foskey:  [name] Other Clinician: Referring Deniece Rankin: Treating Conni Knighton/Extender: [name] Weeks in Treatment: 10 Active Problems Location of Pain Severity and Description of Pain Patient Has Paino No Site Locations Pain Management and Medication Current Pain Management: Electronic Signature(s) Signed: 04/18/2021 5:42:55 PM By: Lorrin Jackson Entered By: Lorrin Jackson on 04/18/2021 10:33:31 -------------------------------------------------------------------------------- Patient/Caregiver Education Details [name] 6/9/2022andnbsp9:45 A M Medical Record Number: [id number] Patient Account Number: [id number] Date of Birth/Gender: Treating RN: 1941-09-17 (80 y.o. [name] Primary Care Physician: [name] Other Clinician: Referring Physician: Treating Physician/Extender: Leonard Downing in Treatment: 10 Education Assessment Education Provided To: Patient Education Topics Provided Wound Debridement: Handouts: Wound Debridement Methods: Explain/Verbal Responses: Reinforcements needed Electronic Signature(s) Signed: 04/18/2021 6:02:04 PM By: Deon Pilling Entered By: Deon Pilling on 04/18/2021 10:52:07 -------------------------------------------------------------------------------- Wound Assessment Details [name] 04/18/2021 9:45 A M Medical Record Number: [id number] Patient Account Number: [id number] Date of Birth/Sex: Treating RN: 1941/02/15 (80 y.o. 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 Wound Open Wounding Event: Other Lesion Status: Date Acquired: 01/10/2020 Comorbid  Cataracts, Chronic sinus problems/congestion, Asthma, Sleep Weeks Of Treatment: 10 History: Apnea, Hypertension, Peripheral Venous Disease, Osteoarthritis Clustered Wound: No Photos Wound Measurements Length: (cm) 0.5 Width: (cm) 0.6 Depth: (cm) 0.3 Area: (cm) 0.236 Volume: (cm) 0.071 % Reduction in Area: 74.9% % Reduction in Volume: 62.2% Epithelialization: Medium (34-66%) Tunneling: No Undermining: No Wound Description Classification: Full Thickness With Exposed Support Structures Wound Margin: Distinct, outline attached Exudate Amount: Medium Exudate Type: Serosanguineous Exudate Color: red, brown Foul Odor After Cleansing: No Slough/Fibrino No Wound Bed Granulation Amount: Large (67-100%) Exposed Structure Granulation Quality: Red Fascia Exposed: No Necrotic Amount: None Present (0%) Fat Layer (Subcutaneous Tissue) Exposed: Yes Tendon Exposed: No Muscle Exposed: No Joint Exposed: No Bone Exposed: Yes Assessment Notes Calloused Periwound Treatment Notes Wound #1 (Toe Great) Wound Laterality: Right Cleanser Wound Cleanser Discharge Instruction: Cleanse the wound with wound cleanser prior to applying a clean dressing using gauze sponges, not tissue or cotton balls. Peri-Wound Care Topical Primary Dressing PolyMem Silver Non-Adhesive Dressing, 4.25x4.25 in Discharge Instruction: Apply to wound bed as instructed Secondary Dressing Woven Gauze Sponges 2x2 in Discharge Instruction: Apply over primary dressing as directed. Secured With Conforming Stretch Gauze Bandage, Sterile 2x75 (in/in) Discharge Instruction: Secure with stretch gauze as directed. Compression Wrap Compression Stockings Add-Ons Electronic Signature(s) Signed: 04/18/2021 5:42:55 PM By: Lorrin Jackson Signed: 04/19/2021 3:50:24 PM By: Sandre Kitty Entered By: Sandre Kitty on 04/18/2021 17:06:38 -------------------------------------------------------------------------------- [place]  Details [name] 04/18/2021 9:45 A M Medical Record Number: [id number] Patient Account Number: [id number] Date of Birth/Sex: Treating RN: [birth year] (80 y.o. [name] Primary Care Thais Silberstein: [name] Other Clinician: Referring Rashard Ryle: Treating Gavrielle Streck/Extender: [name] Weeks in Treatment: 10 Vital Signs Time Taken: 10:33 Temperature (F): 98.3 Height (in): 69 Pulse (bpm): 91 Weight (lbs): 250 Respiratory Rate (breaths/min): 20 Body Mass Index (BMI): 36.9 Blood Pressure (mmHg): 160/80 Reference Range: 80 - 120 mg / dl Electronic Signature(s) Signed: 04/18/2021 5:42:55 PM By: Lorrin Jackson Entered By: Lorrin Jackson on 04/18/2021 10:33:22

## 2021-04-19 NOTE — Progress Notes (Signed)
DAL, BLEW ([id number]) Visit Report for 04/18/2021 HPI Details Patient Name: [name]: AUGUSTO, DECKMAN [name] 04/18/2021 9:45 A M Medical Record Number: [id number] Patient Account Number: [id number] Date of Birth/Sex: Treating RN: [birth year] (80 y.o. [name] Primary Care Provider: [name] Other Clinician: Referring Provider: Treating Provider/Extender: [name] Weeks in Treatment: 10 History of Present Illness HPI Description: ADMISSION 02/07/2021 This is a 80 year old man who is not a diabetic "prediabetic" who comes in today letter asking [place] to look at an area on his right first toe tip that has been there about a year. He has been getting this dressed that [employer] and he had Xeroform on this with gauze. The patient is not exactly sure how this came about but he is not able to dress the wound himself because of limitations due to what sounds like spinal stenosis and pain. He had a wound on the ankle as well but that healed over. He was seen by vascular in May 2021. This was related to a right great toe wound. He had an angiogram that was completely normal on both sides good perfusion right into the feet. He also looks like he has lymphedema and he has compression stockings but he cannot get them on and off so he essentially leaves he is on even when he showering Past medical history includes prediabetes, low back pain secondary I think the lumbar spinal stenosis has been offered a decompressive laminectomy but he has not gone forth with this. 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 ABI was noncompressible in our clinic on the right right first toe. 4/7; patient I admitted the clinic last week. He has an area on the tip of his right first toe. We cleaned this off last week and have been using silver collagen. He is in the same crocs today that he wore last week and I told him not  to use. For 1 reason or another he could not handle a surgical sandal. 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 He is not a diabetic 4/12; right first toe wound chronic. We have been using silver collagen. Once again he comes in then the same pair of old crocs that he comes in every week. 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 His x-ray suggested suspicious for osteomyelitis with bone irregularity at the tuft of the right great toe 4/21; patient has been using silver collagen every other day to the wound on his right great toe. He continues to wear crocs. He has no complaints or issues today. 5/5; 2-week follow-up. 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 Mild soft tissue edema with enhancement at the distal aspect of the great toe suggestive of cellulitis. Fortunately the wound actually looks better. Has been using Hydrofera Blue 5/12; started him on Augmentin last week 875 twice daily he is tolerating this well. Last creatinine I see is 1.77 in early April. We are using Hydrofera Blue to the wound which actually looks better today. 5/17; tolerating the Augmentin well. He says the wound is actually been hurting him episodically. Actually the surface of this looks quite good. He did not get his lab work done. We are using [facility] he is changing this himself 6/2; still taking Augmentin  he should be rounding into his last week. This was empirically for underlying osteomyelitis. The area on the tip of his toe is still open still with some depth but no palpable bone. We have been using Hydrofera Blue. Surface area of the wound has not been making much improvement Is work was essentially unremarkable except for a creatinine of 1.79. Reason for his renal insufficiency is not clear he is not a diabetic does not have hypertension however he does take NSAIDs and  Lasix I wonder if this is the issue here. Note that his creatinine on 11/12/2018 was 1.45. Platelet count slightly low at 143 however his inflammatory markers were really in the normal range at 10 and 2 sedimentation rate and C-reactive protein respectively. 6/9; he apparently had another 2 weeks of Augmentin after we look that this last time. Therefore he should be coming into his last week next week. We are using polymen on the tip of the toe. He cannot get down to do the dressing so he comes in for a nurse visit. He comes in in crocs I have warned him against this although he says he is using a different shoe for most of the day he works in [employer]. Electronic Signature(s) Signed: 04/18/2021 4:38:36 PM By: Linton Ham MD Entered By: Linton Ham on 04/18/2021 10:58:11 -------------------------------------------------------------------------------- Physical Exam Details [name] 04/18/2021 9:45 A M Medical Record Number: [id number] Patient Account Number: [id number] Date of Birth/Sex: Treating RN: 1941/11/02 (80 y.o. [name] Primary Care Provider: [name] Other Clinician: Referring Provider: Treating Provider/Extender: [name] Weeks in Treatment: 10 Constitutional Patient is hypertensive.. Pulse regular and within target range for patient.[name] Respirations regular, non-labored and within target range.. Temperature is normal and within the target range for the patient.[name] Appears in no distress. Notes Wound exam; tip of the right great toe. This looks fairly healthy. No debridement was done. Measuring slightly smaller. This does not probe to bone no surrounding erythema. Pedal pulses are palpable at the dorsalis pedis Electronic Signature(s) Signed: 04/18/2021 4:38:36 PM By: Linton Ham MD Entered By: Linton Ham on 04/18/2021  10:59:03 -------------------------------------------------------------------------------- Physician Orders Details [name] 04/18/2021 9:45 A M Medical Record Number: [id number] Patient Account Number: [id number] Date of Birth/Sex: Treating RN: 17-Nov-1940 (80 y.o. 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 - Dr. Dellia Nims Thursday Return A Nurse Visit: - Monday or Tuesday Bathing/ Shower/ Hygiene May shower with protection but do not get wound dressing(s) wet. - use a cast protector. Edema Control - Lymphedema / SCD / Other Elevate legs to the level of the heart or above for 30 minutes daily and/or when sitting, a frequency of: - throughout the day. Avoid standing for long periods of time. Exercise regularly Moisturize legs daily. - both legs every night before bed. Off-Loading Open toe surgical shoe to: - wound center clinic to provide surgical shoe to patient to wear while walking and standing to right foot. Other: - Stop wearing the Croc slip on shoes. 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 Ensure no pressure of on the tip right great toe. Additional Orders / Instructions Other: - continue to take oral antibiotics. Wound Treatment Wound #1 - T Great oe Wound Laterality: Right Cleanser: Wound Cleanser Every Other Day Discharge Instructions: Cleanse the wound with wound cleanser prior to applying a clean dressing using gauze sponges, not tissue or cotton balls. Prim  Dressing: PolyMem Silver Non-Adhesive Dressing, 4.25x4.25 in Every Other Day ary Discharge Instructions: Apply to wound bed as instructed Secondary Dressing: Woven Gauze Sponges 2x2 in Every Other Day Discharge Instructions: Apply over primary dressing as directed. Secured With: Child psychotherapist, Sterile 2x75 (in/in) Every Other Day Discharge Instructions: Secure with stretch gauze as directed. Electronic Signature(s) Signed: 04/18/2021 4:38:36 PM By: Linton Ham MD Signed: 04/18/2021 6:02:04 PM By: Deon Pilling Entered By: Deon Pilling on 04/18/2021 10:52:49 -------------------------------------------------------------------------------- Problem List Details [name] 04/18/2021 9:45 A M Medical Record Number: [id number] Patient Account Number: [id number] Date of Birth/Sex: Treating RN: Apr 16, 1941 (80 y.o. 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 04/18/2021 9:45 A M Medical Record Number: [id number] Patient Account Number: [id number] Date of Birth/Sex: Treating RN: 12/06/1940 (80 y.o. [name] Primary Care Provider: [name] Other Clinician: Referring Provider: Treating Provider/Extender: [name] Weeks in Treatment: 10 Subjective History of Present Illness (HPI) ADMISSION 02/07/2021 This is a 80 year old man who is not a diabetic "prediabetic" who comes in today letter asking [place] to look at an area on his right first toe tip that has been there about a year. He has been getting this dressed that [employer] and he had Xeroform on this with gauze. The patient is not exactly sure how this came about but he is not able to dress the wound himself because of limitations due to what sounds like spinal stenosis and pain. He had a wound on the ankle as well but that healed over. He was seen by vascular in May 2021. This was related to a right great toe wound. He had an angiogram that was completely normal on both sides good perfusion right into the feet. He also looks like he has lymphedema and he has compression stockings but he cannot get them on and off so he essentially leaves he is on even when he showering Past medical history includes prediabetes, low back pain secondary I think the lumbar spinal stenosis has been offered a decompressive laminectomy but he has not gone forth with this. 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 ABI was noncompressible in our clinic on the right right first toe. 4/7; patient I admitted the clinic last week. He has an area on the tip of his right first toe. We cleaned this off last week and have been using silver collagen. He is in the same  crocs today that he wore last week and I told him not to use. For 1 reason or another he could not handle a surgical sandal. 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 He is not a diabetic 4/12; right first toe wound chronic. We have been using silver collagen. Once again he comes in then the same pair of old crocs that he comes in  every week. 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 His x-ray suggested suspicious for osteomyelitis with bone irregularity at the tuft of the right great toe 4/21; patient has been using silver collagen every other day to the wound on his right great toe. He continues to wear crocs. He has no complaints or issues today. 5/5; 2-week follow-up. 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 Mild soft tissue edema with enhancement at the distal aspect of the great toe suggestive of cellulitis. Fortunately the wound actually looks better. Has been using Hydrofera Blue 5/12; started him on Augmentin last week 875 twice daily he is tolerating this well. Last creatinine I see is 1.77 in early April. We are using Hydrofera Blue to the wound which actually looks better today. 5/17; tolerating the Augmentin well. He says the wound is actually been hurting him episodically. Actually the surface of this looks quite good. He did not get his lab work done. We are using [facility] he is changing this himself 6/2; still taking Augmentin he should be rounding into his last week. This was empirically for underlying osteomyelitis. The area on the tip of his toe is still open still with some depth but no palpable bone. We have been using Hydrofera Blue. Surface area of the wound has not been making much improvement Is work was essentially unremarkable except for a creatinine of 1.79. Reason for his renal insufficiency is not clear he is not a diabetic does not have hypertension however he does take NSAIDs and Lasix I wonder if this is the issue here. Note that his creatinine on 11/12/2018 was 1.45. Platelet count slightly low at 143 however his inflammatory markers were really in the normal range at 10 and 2 sedimentation rate and C-reactive protein respectively. 6/9; he apparently had another 2 weeks of  Augmentin after we look that this last time. Therefore he should be coming into his last week next week. We are using polymen on the tip of the toe. He cannot get down to do the dressing so he comes in for a nurse visit. He comes in in crocs I have warned him against this although he says he is using a different shoe for most of the day he works in [employer]. Objective Constitutional Patient is hypertensive.. Pulse regular and within target range for patient.[name] Respirations regular, non-labored and within target range.. Temperature is normal and within the target range for the patient.[name] Appears in no distress. Vitals Time Taken: 10:33 AM, Height: 69 in, Weight: 250 lbs, BMI: 36.9, Temperature: 98.3 F, Pulse: 91 bpm, Respiratory Rate: 20 breaths/min, Blood Pressure: 160/80 mmHg. General Notes: Wound exam; tip of the right great toe. This looks fairly healthy. No debridement was done. Measuring slightly smaller. This does not probe to bone no surrounding erythema. Pedal pulses are palpable at the dorsalis pedis Integumentary (Hair, Skin) Wound #1 status is Open. Original cause of wound was Other Lesion. The date acquired was: 01/10/2020. The  wound has been in treatment 10 weeks. The wound is located on the Right T Great. The wound measures 0.5cm length x 0.6cm width x 0.3cm depth; 0.236cm^2 area and 0.071cm^3 volume. There is bone and oe Fat Layer (Subcutaneous Tissue) exposed. There is no tunneling or undermining noted. There is a medium amount of serosanguineous drainage noted. The wound margin is distinct with the outline attached to the wound base. There is large (67-100%) red granulation within the wound bed. There is no necrotic tissue within the wound bed. 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 - Dr. Dellia Nims Thursday Nurse Visit: - Monday or Tuesday Bathing/ Shower/ Hygiene: May shower with protection but do not get wound dressing(s) wet. - use a cast protector. Edema Control - Lymphedema / SCD / Other: Elevate legs to the level of the heart or above for 30 minutes daily and/or when sitting, a frequency of: - throughout the day. Avoid standing for long periods of time. Exercise regularly Moisturize legs daily. - both legs every night before bed. Off-Loading: Open toe surgical shoe to: - wound center clinic to provide surgical shoe to patient to wear while walking and standing to right foot. Other: - Stop wearing the Croc slip on shoes. 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 Ensure no pressure of on the tip right great toe. Additional Orders / Instructions: Other: - continue to take oral antibiotics. WOUND #1: - T Great Wound Laterality: Right oe Cleanser: Wound Cleanser Every Other Day/ Discharge Instructions: Cleanse the wound with wound cleanser prior to applying a clean dressing using gauze sponges, not tissue or cotton balls. Prim Dressing: PolyMem Silver Non-Adhesive Dressing, 4.25x4.25 in Every Other Day/ ary Discharge Instructions: Apply to wound bed as instructed Secondary Dressing: Woven Gauze Sponges 2x2 in Every Other Day/ Discharge Instructions: Apply over primary dressing as directed. Secured With: Child psychotherapist, Sterile 2x75 (in/in) Every Other Day/ Discharge Instructions: Secure with stretch gauze as directed. 1. Continue with polymen 2. Should be entering his last week of the Augmentin which was done empirically for underlying osteomyelitis 3. The wound does not look too bad somewhat rolled edges although I did not debride this today. 4. Continued careful vigilance of the wound volume Electronic Signature(s) Signed: 04/18/2021 4:38:36  PM By: Linton Ham MD Entered By: Linton Ham on 04/18/2021 10:59:56 -------------------------------------------------------------------------------- SuperBill Details [name] 04/18/2021 Medical Record Number: [id number] Patient Account Number: [id number] Date of Birth/Sex: Treating RN: 07/01/1941 (80 y.o. 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, lumbar region with neurogenic claudication M86.171 Other acute osteomyelitis, right ankle and foot Facility Procedures CPT4 Code: [id number] Description: 99213 - WOUND CARE VISIT-LEV 3 EST PT Modifier: Quantity: 1 Physician Procedures : CPT4 Code Description Modifier [id number] - WC PHYS LEVEL 3 - EST PT ICD-10 Diagnosis Description L97.518 Non-pressure chronic ulcer of other part of right foot with other specified severity Quantity: 1 Electronic Signature(s) Signed: 04/18/2021 6:02:04 PM By: Deon Pilling Signed: 04/19/2021 5:16:47 PM By: Linton Ham MD Previous Signature: 04/18/2021  4:38:36 PM Version By: Linton Ham MD Entered By: Deon Pilling on 04/18/2021 17:36:05

## 2021-04-23 ENCOUNTER — Other Ambulatory Visit: Payer: Self-pay

## 2021-04-23 ENCOUNTER — Encounter (HOSPITAL_BASED_OUTPATIENT_CLINIC_OR_DEPARTMENT_OTHER): Payer: Medicare Other | Admitting: Internal Medicine

## 2021-04-23 ENCOUNTER — Other Ambulatory Visit: Payer: Self-pay | Admitting: Urology

## 2021-04-23 DIAGNOSIS — M79672 Pain in left foot: Secondary | ICD-10-CM | POA: Diagnosis not present

## 2021-04-23 DIAGNOSIS — G5602 Carpal tunnel syndrome, left upper limb: Secondary | ICD-10-CM | POA: Diagnosis not present

## 2021-04-23 DIAGNOSIS — M48062 Spinal stenosis, lumbar region with neurogenic claudication: Secondary | ICD-10-CM | POA: Diagnosis not present

## 2021-04-23 DIAGNOSIS — G5601 Carpal tunnel syndrome, right upper limb: Secondary | ICD-10-CM | POA: Diagnosis not present

## 2021-04-23 DIAGNOSIS — N400 Enlarged prostate without lower urinary tract symptoms: Secondary | ICD-10-CM

## 2021-04-23 DIAGNOSIS — L97518 Non-pressure chronic ulcer of other part of right foot with other specified severity: Secondary | ICD-10-CM | POA: Diagnosis not present

## 2021-04-23 DIAGNOSIS — M86171 Other acute osteomyelitis, right ankle and foot: Secondary | ICD-10-CM | POA: Diagnosis not present

## 2021-04-23 NOTE — Progress Notes (Signed)
EVANN, ERAZO ([id number]) Visit Report for 04/23/2021 SuperBill Details Patient Name: [name]: DATHAN, ATTIA [name] 04/23/2021 Medical Record Number: [id number] Patient Account Number: [id number] Date of Birth/Sex: Treating RN: Feb 25, 1941 (80 y.o. 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

## 2021-04-24 NOTE — Telephone Encounter (Signed)
No showed last appointment and hasn't been seen in the [place] office in a year.

## 2021-04-24 NOTE — Progress Notes (Signed)
[name] ([id number]) Visit Report for 04/23/2021 Arrival Information Details Patient Name: [name]: [name], [name] [name] 04/23/2021 9:15 A M Medical Record Number: [id number] Patient Account Number: [id number] Date of Birth/Sex: Treating RN: August 15, 1941 (80 y.o. [name], [name] Primary Care [name]: [name] Other Clinician: Referring Marice Guidone: Treating [name] Visit Information History Since Last Visit Added or deleted any medications: No Patient Arrived: Ambulatory Any new allergies or adverse reactions: No Arrival Time: 10:27 Had a fall or experienced change in No Accompanied By: self activities of daily living that may affect Transfer Assistance: None risk of falls: Patient Identification Verified: Yes Signs or symptoms of abuse/neglect since last visito No Secondary Verification Process Completed: Yes Hospitalized since last visit: No Patient Requires Transmission-Based Precautions: No Implantable device outside of the clinic excluding No Patient Has Alerts: Yes cellular tissue based products placed in the center Patient Alerts: R ABI non compressible since last visit: Has Dressing in Place as Prescribed: Yes Pain Present Now: No Electronic Signature(s) Signed: 04/23/2021 4:42:14 PM By: Sandre Kitty Entered By: Sandre Kitty on 04/23/2021 10:27:50 -------------------------------------------------------------------------------- Clinic Level of Care Assessment Details Patient Name: [name]: [name] [name] 04/23/2021 9:15 A M Medical Record Number: [id number] Patient Account Number: [id number] Date of Birth/Sex: Treating RN: Mar 01, 1941 (80 y.o. [name], [name] Primary Care Bracen Schum: [name] Other Clinician: Referring Reka Wist: Treating [name]/Extender: [name] Weeks in Treatment: 10 Clinic Level of Care Assessment Items TOOL  4 Quantity Score X- 1 0 Use when only an EandM is performed on FOLLOW-UP visit ASSESSMENTS - Nursing Assessment / Reassessment X- 1 10 Reassessment of Co-morbidities (includes updates in patient status) X- 1 5 Reassessment of Adherence to Treatment Plan ASSESSMENTS - Wound and Skin A ssessment / Reassessment X - Simple Wound Assessment / Reassessment - one wound 1 5 []  - 0 Complex Wound Assessment / Reassessment - multiple wounds X- 1 10 Dermatologic / Skin Assessment (not related to wound area) ASSESSMENTS - Focused Assessment []  - 0 Circumferential Edema Measurements - multi extremities X- 1 10 Nutritional Assessment / Counseling / Intervention []  - 0 Lower Extremity Assessment (monofilament, tuning fork, pulses) []  - 0 Peripheral Arterial Disease Assessment (using hand held doppler) ASSESSMENTS - Ostomy and/or Continence Assessment and Care []  - 0 Incontinence Assessment and Management []  - 0 Ostomy Care Assessment and Management (repouching, etc.) PROCESS - Coordination of Care X - Simple Patient / Family Education for ongoing care 1 15 []  - 0 Complex (extensive) Patient / Family Education for ongoing care X- 1 10 Staff obtains Programmer, systems, Records, T Results / Process Orders est []  - 0 Staff telephones HHA, Nursing Homes / Clarify orders / etc []  - 0 Routine Transfer to another Facility (non-emergent condition) []  - 0 Routine Hospital Admission (non-emergent condition) []  - 0 New Admissions / Biomedical engineer / Ordering NPWT Apligraf, etc. , []  - 0 Emergency Hospital Admission (emergent condition) X- 1 10 Simple Discharge Coordination []  - 0 Complex (extensive) Discharge Coordination PROCESS - Special Needs []  - 0 Pediatric / Minor Patient Management []  - 0 Isolation Patient Management []  - 0 Hearing / Language / Visual special needs []  - 0 Assessment of Community assistance (transportation, D/C planning, etc.) []  - 0 Additional assistance /  Altered mentation []  - 0 Support Surface(s) Assessment (bed, cushion, seat, etc.) INTERVENTIONS - Wound Cleansing / Measurement X - Simple Wound Cleansing - one wound 1  5 []  - 0 Complex Wound Cleansing - multiple wounds X- 1 5 Wound Imaging (photographs - any number of wounds) []  - 0 Wound Tracing (instead of photographs) X- 1 5 Simple Wound Measurement - one wound []  - 0 Complex Wound Measurement - multiple wounds INTERVENTIONS - Wound Dressings X - Small Wound Dressing one or multiple wounds 1 10 []  - 0 Medium Wound Dressing one or multiple wounds []  - 0 Large Wound Dressing one or multiple wounds []  - 0 Application of Medications - topical []  - 0 Application of Medications - injection INTERVENTIONS - Miscellaneous []  - 0 External ear exam []  - 0 Specimen Collection (cultures, biopsies, blood, body fluids, etc.) []  - 0 Specimen(s) / Culture(s) sent or taken to Lab for analysis []  - 0 Patient Transfer (multiple staff / Civil Service fast streamer / Similar devices) []  - 0 Simple Staple / Suture removal (25 or less) []  - 0 Complex Staple / Suture removal (26 or more) []  - 0 Hypo / Hyperglycemic Management (close monitor of Blood Glucose) []  - 0 Ankle / Brachial Index (ABI) - do not check if billed separately X- 1 5 Vital Signs Has the patient been seen at the hospital within the last three years: Yes Total Score: 105 Level Of Care: New/Established - Level 3 Electronic Signature(s) Signed: 04/23/2021 6:05:55 PM By: Deon Pilling Entered By: Deon Pilling on 04/23/2021 11:10:39 -------------------------------------------------------------------------------- Encounter Discharge Information Details Patient Name: [name]: [name] [name] 04/23/2021 9:15 A M Medical Record Number: [id number] Patient Account Number: [id number] Date of Birth/Sex: Treating RN: Oct 26, 1941 (80 y.o. [name] Primary Care Rockelle Heuerman: [name] Other Clinician: Referring  Evin Chirco: Treating Haskell Rihn/Extender: [name] Weeks in Treatment: 10 Encounter Discharge Information Items Discharge Condition: Stable Ambulatory Status: Ambulatory Discharge Destination: Home Transportation: Private Auto Accompanied By: self Schedule Follow-up Appointment: Yes Clinical Summary of Care: Electronic Signature(s) Signed: 04/23/2021 6:05:55 PM By: Deon Pilling Entered By: Deon Pilling on 04/23/2021 11:10:08 -------------------------------------------------------------------------------- Patient/Caregiver Education Details Patient Name: [name]: [name] [name] 6/14/2022andnbsp9:15 A M Medical Record Number: [id number] Patient Account Number: [id number] Date of Birth/Gender: Treating RN: 03-13-1941 (80 y.o. [name] Primary Care Physician: [name] Other Clinician: Referring Physician: Treating Physician/Extender: Leonard Downing in Treatment: 10 Education Assessment Education Provided To: Patient Education Topics Provided Infection: Handouts: CDC antimicrobial patient education_English, Infection Prevention and Management Methods: Explain/Verbal Responses: Reinforcements needed Electronic Signature(s) Signed: 04/23/2021 6:05:55 PM By: Deon Pilling Entered By: Deon Pilling on 04/23/2021 11:10:00 -------------------------------------------------------------------------------- Wound Assessment Details Patient Name: [name]: [name] [name] 04/23/2021 9:15 A M Medical Record Number: [id number] Patient Account Number: [id number] Date of Birth/Sex: Treating RN: 11/15/1940 (80 y.o. 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 Peri-Wound Care Topical  Primary Dressing PolyMem Silver Non-Adhesive Dressing, 4.25x4.25 in Discharge Instruction: Apply to wound bed as instructed Secondary Dressing Woven Gauze Sponges 2x2 in Discharge Instruction: Apply over primary dressing as directed. Secured With Conforming Stretch Gauze Bandage, Sterile 2x75 (in/in) Discharge Instruction: Secure with stretch gauze as directed. Compression Wrap Compression Stockings Add-Ons Electronic Signature(s) Signed: 04/23/2021 4:42:14 PM By: Sandre Kitty Signed: 04/24/2021 6:22:58 PM By: Rhae Hammock RN Entered By: Sandre Kitty on 04/23/2021 10:28:31 -------------------------------------------------------------------------------- Vitals Details Patient Name: [name]: [name] [name] 04/23/2021 9:15 A M Medical Record Number: [id number] Patient Account Number: [id number] Date of Birth/Sex: Treating RN: 09-10-1941 (80 y.o. [name], [name] Primary Care Iaan Oregel: [name] Other Clinician: Referring Chief Walkup: Treating Jamyah Folk/Extender: [name] Weeks in Treatment: 10 Vital Signs Time Taken: 10:27 Temperature (F): 98.3 Height (in): 69 Pulse (bpm): 90 Weight (lbs): 250 Respiratory Rate (breaths/min): 20 Body Mass Index (BMI): 36.9 Blood Pressure (mmHg): 164/73 Reference Range: 80  - 120 mg / dl Electronic Signature(s) Signed: 04/23/2021 4:42:14 PM By: Sandre Kitty Entered By: Sandre Kitty on 04/23/2021 77:11:65

## 2021-04-25 ENCOUNTER — Encounter (HOSPITAL_BASED_OUTPATIENT_CLINIC_OR_DEPARTMENT_OTHER): Payer: Medicare Other | Admitting: Internal Medicine

## 2021-04-25 ENCOUNTER — Other Ambulatory Visit: Payer: Self-pay

## 2021-04-25 DIAGNOSIS — L97512 Non-pressure chronic ulcer of other part of right foot with fat layer exposed: Secondary | ICD-10-CM | POA: Diagnosis not present

## 2021-04-25 DIAGNOSIS — L97518 Non-pressure chronic ulcer of other part of right foot with other specified severity: Secondary | ICD-10-CM | POA: Diagnosis not present

## 2021-04-25 DIAGNOSIS — L988 Other specified disorders of the skin and subcutaneous tissue: Secondary | ICD-10-CM | POA: Diagnosis not present

## 2021-04-25 DIAGNOSIS — M48062 Spinal stenosis, lumbar region with neurogenic claudication: Secondary | ICD-10-CM | POA: Diagnosis not present

## 2021-04-25 DIAGNOSIS — Z23 Encounter for immunization: Secondary | ICD-10-CM | POA: Diagnosis not present

## 2021-04-25 DIAGNOSIS — M86171 Other acute osteomyelitis, right ankle and foot: Secondary | ICD-10-CM | POA: Diagnosis not present

## 2021-04-26 NOTE — Progress Notes (Signed)
CHUONG, CASEBEER ([id number]) Visit Report for 04/25/2021 HPI Details Patient Name: [name]: MALIKAI, GUT [name] 04/25/2021 10:30 A M Medical Record Number: [id number] Patient Account Number: [id number] Date of Birth/Sex: Treating RN: Oct 26, 1941 (80 y.o. [name] Primary Care Provider: [name] Other Clinician: Referring Provider: Treating Provider/Extender: [name] Weeks in Treatment: 11 History of Present Illness HPI Description: ADMISSION 02/07/2021 This is a 80 year old man who is not a diabetic "prediabetic" who comes in today letter asking [place] to look at an area on his right first toe tip that has been there about a year. He has been getting this dressed that [employer] and he had Xeroform on this with gauze. The patient is not exactly sure how this came about but he is not able to dress the wound himself because of limitations due to what sounds like spinal stenosis and pain. He had a wound on the ankle as well but that healed over. He was seen by vascular in May 2021. This was related to a right great toe wound. He had an angiogram that was completely normal on both sides good perfusion right into the feet. He also looks like he has lymphedema and he has compression stockings but he cannot get them on and off so he essentially leaves he is on even when he showering Past medical history includes prediabetes, low back pain secondary I think the lumbar spinal stenosis has been offered a decompressive laminectomy but he has not gone forth with this. 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 ABI was noncompressible in our clinic on the right right first toe. 4/7; patient I admitted the clinic last week. He has an area on the tip of his right first toe. We cleaned this off last week and have been using silver collagen. He is in the same crocs today that he wore last week and I told him  not to use. For 1 reason or another he could not handle a surgical sandal. 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 He is not a diabetic 4/12; right first toe wound chronic. We have been using silver collagen. Once again he comes in then the same pair of old crocs that he comes in every week. 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 His x-ray suggested suspicious for osteomyelitis with bone irregularity at the tuft of the right great toe 4/21; patient has been using silver collagen every other day to the wound on his right great toe. He continues to wear crocs. He has no complaints or issues today. 5/5; 2-week follow-up. 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 Mild soft tissue edema with enhancement at the distal aspect of the great toe suggestive of cellulitis. Fortunately the wound actually looks better. Has been using Hydrofera Blue 5/12; started him on Augmentin last week 875 twice daily he is tolerating this well. Last creatinine I see is 1.77 in early April. We are using Hydrofera Blue to the wound which actually looks better today. 5/17; tolerating the Augmentin well. He says the wound is actually been hurting him episodically. Actually the surface of this looks quite good. He did not get his lab work done. We are using [facility] he is changing this himself 6/2; still taking Augmentin  he should be rounding into his last week. This was empirically for underlying osteomyelitis. The area on the tip of his toe is still open still with some depth but no palpable bone. We have been using Hydrofera Blue. Surface area of the wound has not been making much improvement Is work was essentially unremarkable except for a creatinine of 1.79. Reason for his renal insufficiency is not clear he is not a diabetic does not have hypertension however he does take NSAIDs and  Lasix I wonder if this is the issue here. Note that his creatinine on 11/12/2018 was 1.45. Platelet count slightly low at 143 however his inflammatory markers were really in the normal range at 10 and 2 sedimentation rate and C-reactive protein respectively. 6/9; he apparently had another 2 weeks of Augmentin after we look that this last time. Therefore he should be coming into his last week next week. We are using polymen on the tip of the toe. He cannot get down to do the dressing so he comes in for a nurse visit. He comes in in crocs I have warned him against this although he says he is using a different shoe for most of the day he works in [employer]. 6/16; he comes back in in crocs. I have spoken him about this before. His areas on the tip of his toe this actually looks quite good we have been using polymen. He has completed his antibiotics which we empirically gave him for underlying osteomyelitis. Everything looks better to me in spite of his noncompliance with footwear recommendation Electronic Signature(s) Signed: 04/26/2021 10:33:35 AM By: Linton Ham MD Entered By: Linton Ham on 04/25/2021 12:18:17 -------------------------------------------------------------------------------- Physical Exam Details [name] 04/25/2021 10:30 A M Medical Record Number: [id number] Patient Account Number: [id number] Date of Birth/Sex: Treating RN: [birth year] (80 y.o. [name] Primary Care Provider: Other Clinician: [name] Referring Provider: Treating Provider/Extender: [name] Weeks in Treatment: 11 Constitutional Patient is hypertensive.. Pulse regular and within target range for patient.[name] Respirations regular, non-labored and within target range.. Temperature is normal and within the target range for the patient.[name] Appears in no distress. Notes Wound exam; tip of the right great toe. This looks fairly  healthy and smaller since no debridement was done. Fortunately he has less callus. Pedal pulses are palpable Electronic Signature(s) Signed: 04/26/2021 10:33:35 AM By: Linton Ham MD Entered By: Linton Ham on 04/25/2021 12:25:18 -------------------------------------------------------------------------------- Physician Orders Details [name] 04/25/2021 10:30 A M Medical Record Number: [id number] Patient Account Number: [id number] Date of Birth/Sex: Treating RN: 01-25-1941 (81 y.o. 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 - Dr. Dellia Nims Thursday Return A Nurse Visit: - Monday or Tuesday Bathing/ Shower/ Hygiene May shower with protection but do not get wound dressing(s) wet. - use a cast protector. Edema Control - Lymphedema / SCD / Other Elevate legs to the level of the heart or above for 30 minutes daily and/or when sitting, a frequency of: - throughout the day. Avoid standing for long periods of time. Exercise regularly  Moisturize legs daily. - both legs every night before bed. Off-Loading Open toe surgical shoe to: - wound center clinic to provide surgical shoe to patient to wear while walking and standing to right foot. Other: - Stop wearing the Croc slip on shoes. 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 Ensure no pressure of on the tip right great toe. Wound Treatment Wound #1 - T Great oe Wound Laterality: Right Cleanser: Wound Cleanser Every Other  Day Discharge Instructions: Cleanse the wound with wound cleanser prior to applying a clean dressing using gauze sponges, not tissue or cotton balls. Prim Dressing: PolyMem Silver Non-Adhesive Dressing, 4.25x4.25 in Every Other Day ary Discharge Instructions: Apply to wound bed as instructed Secondary Dressing: Woven Gauze Sponges 2x2 in Every Other Day Discharge Instructions: Apply over primary dressing as directed. Secured With: Child psychotherapist, Sterile 2x75 (in/in) Every Other Day Discharge Instructions: Secure with stretch gauze as directed. Electronic Signature(s) Signed: 04/25/2021 5:31:56 PM By: Deon Pilling Signed: 04/26/2021 10:33:35 AM By: Linton Ham MD Entered By: Deon Pilling on 04/25/2021 11:50:20 -------------------------------------------------------------------------------- Problem List Details [name] 04/25/2021 10:30 A M Medical Record Number: [id number] Patient Account Number: [id number] Date of Birth/Sex: Treating RN: [birth year] (80 y.o. 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 04/25/2021 10:30 A M Medical Record Number: [id number] Patient Account Number: [id number] Date of Birth/Sex: Treating RN: 1941/10/03 (80 y.o. [name] Primary Care Provider: [name] Other Clinician: Referring Provider: Treating Provider/Extender: [name] Weeks in Treatment: 11 Subjective History of Present Illness (HPI) ADMISSION 02/07/2021 This is a 80 year old man who is not a diabetic "prediabetic" who comes in today letter asking [place] to look at an area on his right first toe tip that has been there about a year. He has been getting this dressed that [employer] and he had Xeroform on this with gauze. The patient is not exactly sure how this came about but he is not able to dress the wound himself because of limitations due to what sounds like spinal stenosis and pain. He had a wound on the ankle as well but that healed over. He was seen by vascular in May 2021. This was related to a right great toe wound. He had an angiogram that was completely normal on both sides good perfusion right into the feet. He also looks like he has lymphedema and he has compression stockings but he cannot get them on and off so he essentially leaves he is on even when he showering Past medical history includes prediabetes, low back pain secondary I think the lumbar spinal stenosis has been offered a decompressive laminectomy but he has not gone forth with this. 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 ABI was noncompressible in  our clinic on the right right first toe. 4/7; patient I admitted the clinic last week. He has an area on the tip of his right first toe. We cleaned this off last week and have been using silver collagen. He is in the same crocs today that he wore last week and I told him not to use. For 1 reason or another he could not handle a surgical sandal. 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 He is not a diabetic 4/12; right first toe wound chronic. We have been using silver collagen. Once again he comes in then the same pair of old crocs that he comes in every week. 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 His x-ray suggested suspicious for osteomyelitis with bone irregularity at the tuft of the right great toe 4/21; patient has been using silver collagen every other day to the wound on his right great toe. He continues to wear crocs. He has no complaints or issues today. 5/5; 2-week follow-up. 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 Mild soft tissue edema with enhancement at the distal aspect of the great toe suggestive of cellulitis. Fortunately the wound actually looks better. Has been using Hydrofera Blue 5/12; started him on Augmentin last week 875 twice daily he is tolerating this well. Last creatinine I see is 1.77 in early April. We are using Hydrofera Blue to the wound which actually looks better today. 5/17; tolerating the Augmentin well. He says the wound is actually been hurting him episodically. Actually the surface of this looks quite good. He did not get his lab work done. We are using [facility] he is changing this himself 6/2; still taking Augmentin he should be rounding into his last week. This was empirically for underlying osteomyelitis. The area on the tip of his toe is still open still with some depth but no palpable bone. We have been using Hydrofera Blue. Surface area of the wound has not been making much improvement Is work was essentially unremarkable except for a creatinine of 1.79. Reason for his renal insufficiency is not clear he is not a diabetic does not have hypertension however he does take NSAIDs and Lasix I wonder if this is the issue here. Note that his creatinine on 11/12/2018 was  1.45. Platelet count slightly low at 143 however his inflammatory markers were really in the normal range at 10 and 2 sedimentation rate and C-reactive protein respectively. 6/9; he apparently had another 2 weeks of Augmentin after we look that this last time. Therefore he should be coming into his last week next week. We are using polymen on the tip of the toe. He cannot get down to do the dressing so he comes in for a nurse visit. He comes in in crocs I have warned him against this although he says he is using a different shoe for most of the day he works in [employer]. 6/16; he comes back in in crocs. I have spoken him about this before. His areas on the tip of his toe this actually looks quite good we have been using polymen. He has completed his antibiotics which we empirically gave him for underlying osteomyelitis. Everything looks better to me in spite of his noncompliance with footwear recommendation Objective Constitutional Patient is hypertensive.. Pulse regular and within target range for patient.[name] Respirations regular, non-labored and within  target range.. Temperature is normal and within the target range for the patient.[name] Appears in no distress. Vitals Time Taken: 11:18 AM, Height: 69 in, Weight: 250 lbs, BMI: 36.9, Temperature: 98.3 F, Pulse: 76 bpm, Respiratory Rate: 20 breaths/min, Blood Pressure: 151/81 mmHg. General Notes: Wound exam; tip of the right great toe. This looks fairly healthy and smaller since no debridement was done. Fortunately he has less callus. Pedal pulses are palpable Integumentary (Hair, Skin) Wound #1 status is Open. Original cause of wound was Other Lesion. The date acquired was: 01/10/2020. The wound has been in treatment 11 weeks. The wound is located on the Right T Great. The wound measures 0.3cm length x 0.3cm width x 0.3cm depth; 0.071cm^2 area and 0.021cm^3 volume. There is Fat Layer oe (Subcutaneous Tissue) exposed. There is no tunneling or  undermining noted. There is a medium amount of serosanguineous drainage noted. The wound margin is well defined and not attached to the wound base. There is large (67-100%) pink, pale granulation within the wound bed. There is no necrotic tissue within the wound bed. 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 - Dr. Dellia Nims Thursday Nurse Visit: - Monday or Tuesday Bathing/ Shower/ Hygiene: May shower with protection but do not get wound dressing(s) wet. - use a cast protector. Edema Control - Lymphedema / SCD / Other: Elevate legs to the level of the heart or above for 30 minutes daily and/or when sitting, a frequency of: - throughout the day. Avoid standing for long periods of time. Exercise regularly Moisturize legs daily. - both legs every night before bed. Off-Loading: Open toe surgical shoe to: - wound center clinic to provide surgical shoe to patient to wear while walking and standing to right foot. Other: - Stop wearing the Croc slip on shoes. 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 Ensure no pressure of on the tip right great toe. WOUND #1: - T Great Wound Laterality: Right oe Cleanser: Wound Cleanser Every Other Day/ Discharge Instructions: Cleanse the wound with wound cleanser prior to applying a clean dressing using gauze sponges, not tissue or cotton balls. Prim Dressing: PolyMem Silver Non-Adhesive Dressing, 4.25x4.25 in Every Other Day/ ary Discharge Instructions: Apply to wound bed as instructed Secondary Dressing: Woven Gauze Sponges 2x2 in Every Other Day/ Discharge Instructions: Apply over primary dressing as directed. Secured With: Child psychotherapist, Sterile 2x75 (in/in) Every Other Day/ Discharge Instructions:  Secure with stretch gauze as directed. 1. Continue with polymen. 2. The patient has completed his antibiotics hopefully this is controlled his underlying bone infection 3. He is not offloading this toe properly and I been over this with him anytime Electronic Signature(s) Signed: 04/26/2021 10:33:35 AM By: Linton Ham MD Entered By: Linton Ham on 04/25/2021 12:26:06 -------------------------------------------------------------------------------- SuperBill Details [name] 04/25/2021 Medical Record Number: [id number] Patient Account Number: [id number] Date of Birth/Sex: Treating RN: November 12, 1940 (80 y.o. 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

## 2021-04-26 NOTE — Progress Notes (Signed)
AUSTON, HALFMANN ([id number]) Visit Report for 04/25/2021 Arrival Information Details Patient Name: [name]: STARLIN, STEIB [name] 04/25/2021 10:30 A M Medical Record Number: [id number] Patient Account Number: [id number] Date of Birth/Sex: Treating RN: [birth year] (80 y.o. [name] Primary Care Mikyah Alamo: [name] Other Clinician: Referring Delainey Winstanley: Treating Kallan Bischoff/Extender: [name] Weeks in Treatment: 11 Visit Information History Since Last Visit Added or deleted any medications: No Patient Arrived: Ambulatory Any new allergies or adverse reactions: No Arrival Time: 11:16 Had a fall or experienced change in No Transfer Assistance: None activities of daily living that may affect Patient Identification Verified: Yes risk of falls: Secondary Verification Process Completed: Yes Signs or symptoms of abuse/neglect since No Patient Requires Transmission-Based Precautions: No last visito Patient Has Alerts: Yes Hospitalized since last visit: No Patient Alerts: R ABI non compressible Implantable device outside of the clinic No excluding cellular tissue based products placed in the center since last visit: Has Dressing in Place as Prescribed: Yes Has Footwear/Offloading in Place as No Prescribed: Right: Surgical Shoe with Pressure Relief Insole Pain Present Now: No Electronic Signature(s) Signed: 04/25/2021 6:07:06 PM By: Lorrin Jackson Entered By: Lorrin Jackson on 04/25/2021 11:18:41 -------------------------------------------------------------------------------- Clinic Level of Care Assessment Details Patient Name: [name]: MAXWELL, LEMEN [name] 04/25/2021 10:30 A M Medical Record Number: [id number] Patient Account Number: [id number] Date of Birth/Sex: Treating RN: 03-Feb-1941 (80 y.o. [name], [name] Primary Care Dreonna Hussein: [name] Other Clinician: Referring Weaver Tweed: Treating Aldric Wenzler/Extender: [name] Weeks in Treatment: 11 Clinic Level of Care Assessment Items TOOL 4 Quantity Score X- 1 0 Use when only an EandM is performed on FOLLOW-UP visit ASSESSMENTS - Nursing Assessment / Reassessment X- 1 10 Reassessment of Co-morbidities (includes updates in patient status) X- 1 5 Reassessment of Adherence to Treatment Plan ASSESSMENTS - Wound and Skin A ssessment / Reassessment X - Simple Wound Assessment / Reassessment - one wound 1 5 []  - 0 Complex Wound Assessment / Reassessment - multiple wounds X- 1 10 Dermatologic / Skin Assessment (not related to wound area) ASSESSMENTS - Focused Assessment X- 1 5 Circumferential Edema Measurements - multi extremities X- 1 10 Nutritional Assessment / Counseling / Intervention []  - 0 Lower Extremity Assessment (monofilament, tuning fork, pulses) []  - 0 Peripheral Arterial Disease Assessment (using hand held doppler) ASSESSMENTS - Ostomy and/or Continence Assessment and Care []  - 0 Incontinence Assessment and Management []  - 0 Ostomy Care Assessment and Management (repouching, etc.) PROCESS - Coordination of Care X - Simple Patient / Family Education for ongoing care 1 15 []  - 0 Complex (extensive) Patient / Family Education for ongoing care X- 1 10 Staff obtains Programmer, systems, Records, T Results / Process Orders est []  - 0 Staff telephones HHA, Nursing Homes / Clarify orders / etc []  - 0 Routine Transfer to another Facility (non-emergent condition) []  - 0 Routine Hospital Admission (non-emergent condition) []  - 0 New Admissions / Biomedical engineer / Ordering NPWT Apligraf, etc. , []  - 0 Emergency Hospital Admission (emergent condition) X- 1 10 Simple Discharge Coordination []  - 0 Complex (extensive) Discharge Coordination PROCESS - Special Needs []  - 0 Pediatric / Minor Patient Management []  - 0 Isolation Patient Management []  - 0 Hearing / Language / Visual special needs []  - 0 Assessment of  Community assistance (transportation, D/C planning, etc.) []  - 0 Additional assistance / Altered mentation []  - 0 Support Surface(s) Assessment (bed, cushion, seat, etc.) INTERVENTIONS - Wound Cleansing /  Measurement X - Simple Wound Cleansing - one wound 1 5 []  - 0 Complex Wound Cleansing - multiple wounds X- 1 5 Wound Imaging (photographs - any number of wounds) []  - 0 Wound Tracing (instead of photographs) X- 1 5 Simple Wound Measurement - one wound []  - 0 Complex Wound Measurement - multiple wounds INTERVENTIONS - Wound Dressings X - Small Wound Dressing one or multiple wounds 1 10 []  - 0 Medium Wound Dressing one or multiple wounds []  - 0 Large Wound Dressing one or multiple wounds []  - 0 Application of Medications - topical []  - 0 Application of Medications - injection INTERVENTIONS - Miscellaneous []  - 0 External ear exam []  - 0 Specimen Collection (cultures, biopsies, blood, body fluids, etc.) []  - 0 Specimen(s) / Culture(s) sent or taken to Lab for analysis []  - 0 Patient Transfer (multiple staff / Civil Service fast streamer / Similar devices) []  - 0 Simple Staple / Suture removal (25 or less) []  - 0 Complex Staple / Suture removal (26 or more) []  - 0 Hypo / Hyperglycemic Management (close monitor of Blood Glucose) []  - 0 Ankle / Brachial Index (ABI) - do not check if billed separately X- 1 5 Vital Signs Has the patient been seen at the hospital within the last three years: Yes Total Score: 110 Level Of Care: New/Established - Level 3 Electronic Signature(s) Signed: 04/25/2021 5:31:56 PM By: Deon Pilling Entered By: Deon Pilling on 04/25/2021 13:54:38 -------------------------------------------------------------------------------- Lower Extremity Assessment Details [name] 04/25/2021 10:30 A M Medical Record Number: [id number] Patient Account Number: [id number] Date of Birth/Sex: Treating RN: July 08, 1941 (80 y.o. [name] Primary Care Kynedi Profitt: [name] Other Clinician: Referring Kingslee Dowse: Treating Lajoyce Tamura/Extender: [name] Weeks in Treatment: 11 Edema Assessment Assessed: [name]: No] [name]: Yes] Edema: [Left: Ye] [Right: s] Calf Left: Right: Point of Measurement: 33 cm From Medial Instep 39 cm Ankle Left: Right: Point of Measurement: 10 cm From Medial Instep 2.5 cm Vascular Assessment Pulses: Dorsalis Pedis Palpable: [Right:Yes] Electronic Signature(s) Signed: 04/25/2021 6:07:06 PM By: Lorrin Jackson Entered By: Lorrin Jackson on 04/25/2021 11:23:37 -------------------------------------------------------------------------------- Multi Wound Chart Details [name] 04/25/2021 10:30 A M Medical Record Number: [id number] Patient Account Number: [id number] Date of Birth/Sex: Treating RN: July 19, 1941 (80 y.o. 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] [N/A:N/A] Primary Etiology: [1:Cataracts, Chronic sinus] [N/A:N/A] Comorbid History: [1:problems/congestion, Asthma, Sleep Apnea, Hypertension, Peripheral Venous Disease, Osteoarthritis 01/10/2020] [N/A:N/A] Date Acquired: [1:11] [N/A:N/A] Weeks of Treatment: [1:Open] [N/A:N/A] Wound Status: [1:0.3x0.3x0.3] [N/A:N/A] Measurements L x W x D (cm) [1:0.071] [N/A:N/A] A (cm) : rea [1:0.021] [N/A:N/A] Volume (cm) : [1:92.50%] [N/A:N/A] % Reduction in Area: [1:88.80%] [N/A:N/A] % Reduction in Volume: [1:Full Thickness With  Exposed Support N/A] Classification: [1:Structures Medium] [N/A:N/A] Exudate Amount: [1:Serosanguineous] [N/A:N/A] Exudate Type: [1:red, brown] [N/A:N/A] Exudate Color: [1:Well defined, not attached] [N/A:N/A] Wound Margin: [1:Large (67-100%)] [N/A:N/A] Granulation Amount: [1:Pink, Pale] [N/A:N/A] Granulation Quality: [1:None Present (0%)] [N/A:N/A] Necrotic Amount: [1:Fat Layer (Subcutaneous Tissue): Yes N/A] Exposed Structures: [1:Fascia: No Tendon: No Muscle: No Joint: No Bone: No Calloused Periwound] [N/A:N/A] Treatment Notes Electronic Signature(s) Signed: 04/25/2021  5:31:56 PM By: Deon Pilling Signed: 04/26/2021 10:33:35 AM By: Linton Ham MD Entered By: Linton Ham on 04/25/2021 12:17:10 -------------------------------------------------------------------------------- Multi-Disciplinary Care Plan Details [name] 04/25/2021 10:30 A M Medical Record Number: [id number] Patient Account Number: [id number] Date of Birth/Sex: Treating RN: 01/15/1941 (80 y.o. [name], [name] Primary Care Kolbee Stallman: [name] Other Clinician: Referring Inman Fettig: Treating Gypsy Kellogg/Extender: [name] Weeks in Treatment: 11 Active Inactive Necrotic Tissue Nursing Diagnoses: Impaired tissue integrity related to necrotic/devitalized tissue Knowledge deficit related to management of necrotic/devitalized tissue Goals: Necrotic/devitalized tissue will be minimized in the wound bed Date Initiated: 02/07/2021 Target Resolution Date: 05/11/2021 Goal Status: Active Patient/caregiver will verbalize understanding of reason and process for debridement of necrotic tissue Date Initiated: 02/07/2021 Target Resolution Date: 05/10/2021 Goal Status: Active Interventions: Assess patient pain level pre-, during and post procedure and prior to discharge Provide education on necrotic tissue and debridement process Treatment Activities: T  ordered outside of clinic : 02/07/2021 est Notes: Osteomyelitis Nursing Diagnoses: Infection: osteomyelitis Goals: Diagnostic evaluation for osteomyelitis completed as ordered Date Initiated: 03/14/2021 Target Resolution Date: 05/10/2021 Goal Status: Active Signs and symptoms for osteomyelitis will be recognized and promptly addressed Date Initiated: 03/14/2021 Target Resolution Date: 05/10/2021 Goal Status: Active Interventions: Assess for signs and symptoms of osteomyelitis resolution every visit Provide education on osteomyelitis Screen for HBO Treatment Activities: MRI : 03/13/2021 Systemic antibiotics : 03/14/2021 Notes: Electronic Signature(s) Signed: 04/25/2021 5:31:56 PM By: Deon Pilling Entered By: Deon Pilling on 04/25/2021 13:53:58 -------------------------------------------------------------------------------- Pain Assessment Details [name] 04/25/2021 10:30 A M Medical Record Number: [id number] Patient Account Number: [id number] Date of Birth/Sex: Treating RN: 1941-08-08 (80 y.o. [name] Primary Care Niyam Bisping: [name] Other Clinician: Referring Darielys Giglia: Treating Dulcey Riederer/Extender: [name] Weeks in Treatment: 11 Active Problems Location of Pain Severity and Description of Pain Patient Has Paino No Site Locations Pain Management and Medication Current Pain Management: Electronic Signature(s) Signed: 04/25/2021 6:07:06 PM By: Lorrin Jackson Entered By: Lorrin Jackson on 04/25/2021 11:18:50 -------------------------------------------------------------------------------- Patient/Caregiver Education Details [name] 6/16/2022andnbsp10:30 A M Medical Record Number: [id number] Patient Account Number: [id number] Date of Birth/Gender: Treating RN: 16-Jun-1941 (80 y.o. [name] Primary Care Physician: [name] Other  Clinician: Referring Physician: Treating Physician/Extender: Leonard Downing in Treatment: 11 Education Assessment Education Provided To: Patient Education Topics Provided Wound/Skin Impairment: Handouts: Skin Care Do's and Dont's Methods: Explain/Verbal Responses: Reinforcements needed Electronic Signature(s) Signed: 04/25/2021 5:31:56 PM By: Deon Pilling Entered By: Deon Pilling on 04/25/2021 13:54:18 -------------------------------------------------------------------------------- Wound Assessment Details [name] 04/25/2021 10:30 A M Medical Record Number: [id number] Patient Account Number: [id number] Date of Birth/Sex: Treating RN: 10/07/1941 (80 y.o. 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 Great oe Wound Open Wounding Event: Other Lesion Status: Date Acquired: 01/10/2020 Comorbid Cataracts, Chronic sinus problems/congestion, Asthma, Sleep Weeks Of Treatment: 11 History: Apnea, Hypertension, Peripheral Venous Disease, Osteoarthritis Clustered Wound: No Photos Wound Measurements Length: (cm) 0.3 Width: (cm) 0.3 Depth: (cm) 0.3 Area: (cm) 0.071 Volume: (cm) 0.021 % Reduction in Area: 92.5% % Reduction in Volume: 88.8% Tunneling: No Undermining: No Wound Description Classification: Full Thickness With Exposed Support Structures  Wound Margin: Well defined, not attached Exudate Amount: Medium Exudate Type: Serosanguineous Exudate Color: red, brown Foul Odor After Cleansing: No Slough/Fibrino No Wound Bed Granulation Amount: Large (67-100%) Exposed Structure Granulation Quality: Pink, Pale Fascia Exposed: No Necrotic Amount: None Present (0%) Fat Layer  (Subcutaneous Tissue) Exposed: Yes Tendon Exposed: No Muscle Exposed: No Joint Exposed: No Bone Exposed: No Assessment Notes Calloused Periwound Electronic Signature(s) Signed: 04/25/2021 5:21:02 PM By: Sandre Kitty Signed: 04/25/2021 6:07:06 PM By: Lorrin Jackson Entered By: Sandre Kitty on 04/25/2021 16:51:20 -------------------------------------------------------------------------------- Vitals Details [name] 04/25/2021 10:30 A M Medical Record Number: [id number] Patient Account Number: [id number] Date of Birth/Sex: Treating RN: April 14, 1941 (80 y.o. [name] Primary Care Omeka Holben: [name] Other Clinician: Referring Lucila Klecka: Treating Jaydrian Corpening/Extender: [name] Weeks in Treatment: 11 Vital Signs Time Taken: 11:18 Temperature (F): 98.3 Height (in): 69 Pulse (bpm): 76 Weight (lbs): 250 Respiratory Rate (breaths/min): 20 Body Mass Index (BMI): 36.9 Blood Pressure (mmHg): 151/81 Reference Range: 80 - 120 mg / dl Electronic Signature(s) Signed: 04/25/2021 6:07:06 PM By: Lorrin Jackson Entered By: Lorrin Jackson on 04/25/2021 11:20:41

## 2021-04-30 ENCOUNTER — Other Ambulatory Visit: Payer: Self-pay

## 2021-04-30 ENCOUNTER — Encounter (HOSPITAL_BASED_OUTPATIENT_CLINIC_OR_DEPARTMENT_OTHER): Payer: Medicare Other | Admitting: Internal Medicine

## 2021-04-30 DIAGNOSIS — L97518 Non-pressure chronic ulcer of other part of right foot with other specified severity: Secondary | ICD-10-CM | POA: Diagnosis not present

## 2021-04-30 DIAGNOSIS — M86171 Other acute osteomyelitis, right ankle and foot: Secondary | ICD-10-CM | POA: Diagnosis not present

## 2021-04-30 DIAGNOSIS — M48062 Spinal stenosis, lumbar region with neurogenic claudication: Secondary | ICD-10-CM | POA: Diagnosis not present

## 2021-04-30 NOTE — Progress Notes (Signed)
AIDENJAMES, HECKMANN ([id number]) Visit Report for 04/30/2021 Arrival Information Details Patient Name: [name]: FARID, GRIGORIAN [name] 04/30/2021 8:45 A M Medical Record Number: [id number] Patient Account Number: [id number] Date of Birth/Sex: Treating RN: 01-04-1941 (80 y.o. [name], [name] Primary Care Kae Lauman: [name] Other Clinician: Referring Darus Hershman: Treating Neha Waight/Extender: [name] Weeks in Treatment: 11 Visit Information History Since Last Visit Added or deleted any medications: No Patient Arrived: Ambulatory Any new allergies or adverse reactions: No Arrival Time: 09:14 Had a fall or experienced change in No Accompanied By: self activities of daily living that may affect Transfer Assistance: None risk of falls: Patient Identification Verified: Yes Signs or symptoms of abuse/neglect since last visito No Secondary Verification Process Completed: Yes Hospitalized since last visit: No Patient Requires Transmission-Based Precautions: No Implantable device outside of the clinic excluding No Patient Has Alerts: Yes cellular tissue based products placed in the center Patient Alerts: R ABI non compressible since last visit: Has Dressing in Place as Prescribed: Yes Pain Present Now: No Electronic Signature(s) Signed: 04/30/2021 9:20:10 AM By: Sandre Kitty Entered By: Sandre Kitty on 04/30/2021 09:16:01 -------------------------------------------------------------------------------- Clinic Level of Care Assessment Details Patient Name: [name]: HARUO, STEPANEK [name] 04/30/2021 8:45 A M Medical Record Number: [id number] Patient Account Number: [id number] Date of Birth/Sex: Treating RN: [birth year] (80 y.o. [name] Primary Care Harce Volden: [name] Other Clinician: Referring Maicol Bowland: Treating Yaneli Keithley/Extender: [name] Weeks in Treatment: 11 Clinic Level of Care Assessment  Items TOOL 4 Quantity Score X- 1 0 Use when only an EandM is performed on FOLLOW-UP visit ASSESSMENTS - Nursing Assessment / Reassessment []  - 0 Reassessment of Co-morbidities (includes updates in patient status) []  - 0 Reassessment of Adherence to Treatment Plan ASSESSMENTS - Wound and Skin A ssessment / Reassessment X - Simple Wound Assessment / Reassessment - one wound 1 5 []  - 0 Complex Wound Assessment / Reassessment - multiple wounds []  - 0 Dermatologic / Skin Assessment (not related to wound area) ASSESSMENTS - Focused Assessment []  - 0 Circumferential Edema Measurements - multi extremities []  - 0 Nutritional Assessment / Counseling / Intervention []  - 0 Lower Extremity Assessment (monofilament, tuning fork, pulses) []  - 0 Peripheral Arterial Disease Assessment (using hand held doppler) ASSESSMENTS - Ostomy and/or Continence Assessment and Care []  - 0 Incontinence Assessment and Management []  - 0 Ostomy Care Assessment and Management (repouching, etc.) PROCESS - Coordination of Care []  - 0 Simple Patient / Family Education for ongoing care X- 1 20 Complex (extensive) Patient / Family Education for ongoing care []  - 0 Staff obtains Programmer, systems, Records, T Results / Process Orders est []  - 0 Staff telephones HHA, Nursing Homes / Clarify orders / etc []  - 0 Routine Transfer to another Facility (non-emergent condition) []  - 0 Routine Hospital Admission (non-emergent condition) []  - 0 New Admissions / Biomedical engineer / Ordering NPWT Apligraf, etc. , []  - 0 Emergency Hospital Admission (emergent condition) []  - 0 Simple Discharge Coordination []  - 0 Complex (extensive) Discharge Coordination PROCESS - Special Needs []  - 0 Pediatric / Minor Patient Management []  - 0 Isolation Patient Management []  - 0 Hearing / Language / Visual special needs []  - 0 Assessment of Community assistance (transportation, D/C planning, etc.) []  - 0 Additional assistance  / Altered mentation []  - 0 Support Surface(s) Assessment (bed, cushion, seat, etc.) INTERVENTIONS - Wound Cleansing / Measurement X - Simple Wound Cleansing - one wound 1 5 []  -  0 Complex Wound Cleansing - multiple wounds []  - 0 Wound Imaging (photographs - any number of wounds) []  - 0 Wound Tracing (instead of photographs) X- 1 5 Simple Wound Measurement - one wound []  - 0 Complex Wound Measurement - multiple wounds INTERVENTIONS - Wound Dressings []  - 0 Small Wound Dressing one or multiple wounds X- 1 15 Medium Wound Dressing one or multiple wounds []  - 0 Large Wound Dressing one or multiple wounds []  - 0 Application of Medications - topical []  - 0 Application of Medications - injection INTERVENTIONS - Miscellaneous []  - 0 External ear exam []  - 0 Specimen Collection (cultures, biopsies, blood, body fluids, etc.) []  - 0 Specimen(s) / Culture(s) sent or taken to Lab for analysis []  - 0 Patient Transfer (multiple staff / Civil Service fast streamer / Similar devices) []  - 0 Simple Staple / Suture removal (25 or less) []  - 0 Complex Staple / Suture removal (26 or more) []  - 0 Hypo / Hyperglycemic Management (close monitor of Blood Glucose) []  - 0 Ankle / Brachial Index (ABI) - do not check if billed separately X- 1 5 Vital Signs Has the patient been seen at the hospital within the last three years: Yes Total Score: 55 Level Of Care: New/Established - Level 2 Electronic Signature(s) Signed: 04/30/2021 4:28:34 PM By: Lorrin Jackson Entered By: Lorrin Jackson on 04/30/2021 10:16:25 -------------------------------------------------------------------------------- Encounter Discharge Information Details [name] 04/30/2021 8:45 A M Medical Record Number: [id number] Patient Account Number: [id number] Date of Birth/Sex: Treating RN: July 11, 1941 (80 y.o. [name] Primary Care Anwar Crill: [name] Other Clinician: Referring  Kathleen Likins: Treating Parnell Spieler/Extender: [name] Weeks in Treatment: 11 Encounter Discharge Information Items Discharge Condition: Stable Ambulatory Status: Ambulatory Discharge Destination: Home Transportation: Private Auto Schedule Follow-up Appointment: Yes Clinical Summary of Care: Provided on 04/30/2021 Form Type Recipient Paper Patient Patient Electronic Signature(s) Signed: 04/30/2021 10:17:36 AM By: Lorrin Jackson Entered By: Lorrin Jackson on 04/30/2021 10:17:35 -------------------------------------------------------------------------------- Patient/Caregiver Education Details [name] 6/21/2022andnbsp8:45 A M Medical Record Number: [id number] Patient Account Number: [id number] Date of Birth/Gender: Treating RN: 1941-03-07 (80 y.o. [name] Primary Care Physician: [name] Other Clinician: Referring Physician: Treating Physician/Extender: Leonard Downing in Treatment: 11 Education Assessment Education Provided To: Patient Education Topics Provided Venous: Methods: Explain/Verbal Responses: State content correctly Wound/Skin Impairment: Methods: Demonstration, Explain/Verbal, Printed Responses: State content correctly Electronic Signature(s) Signed: 04/30/2021 4:28:34 PM By: Lorrin Jackson Entered By: Lorrin Jackson on 04/30/2021 10:17:21 -------------------------------------------------------------------------------- Wound Assessment Details [name] 04/30/2021 8:45 A M Medical Record Number: [id number] Patient Account Number: [id number] Date of Birth/Sex: Treating RN: 07-11-1941 (80 y.o. [name], [name] Primary Care Elgar Scoggins: [name] Other Clinician: Referring Furman Trentman: Treating Olof Marcil/Extender: [name] Weeks in Treatment: 11 Wound Status Wound Number: 1 Primary Neuropathic  Ulcer-Non Diabetic Etiology: Wound Location: Right T Great oe Wound Open Wounding Event: Other Lesion Status: Date Acquired: 01/10/2020 Comorbid Cataracts, Chronic sinus problems/congestion, Asthma, Sleep Weeks Of Treatment: 11 History: Apnea, Hypertension, Peripheral Venous Disease, Osteoarthritis Clustered Wound: No Wound Measurements Length: (cm) 0.3 Width: (cm) 0.3 Depth: (cm) 0.3 Area: (cm) 0.071 Volume: (cm) 0.021 % Reduction in Area: 92.5% % Reduction in Volume: 88.8% Epithelialization: Medium (34-66%) Tunneling: No Undermining: No Wound Description Classification: Full Thickness With Exposed Support Structures Wound Margin: Well defined, not attached Exudate Amount: Medium Exudate Type: Serosanguineous  Exudate Color: red, brown Foul Odor After Cleansing: No Slough/Fibrino No Wound Bed Granulation Amount: Large (67-100%) Exposed Structure Granulation Quality: Pink, Pale Fascia Exposed: No Necrotic Amount: None Present (0%) Fat Layer (Subcutaneous Tissue) Exposed: Yes Tendon Exposed: No Muscle Exposed: No Joint Exposed: No Bone Exposed: No Treatment Notes Wound #1 (Toe Great) Wound Laterality: Right Cleanser Wound Cleanser Discharge Instruction: Cleanse the wound with wound cleanser prior to applying a clean dressing using gauze sponges, not tissue or cotton balls. Peri-Wound Care Topical Primary Dressing PolyMem Silver Non-Adhesive Dressing, 4.25x4.25 in Discharge Instruction: Apply to wound bed as instructed Secondary Dressing Woven Gauze Sponges 2x2 in Discharge Instruction: Apply over primary dressing as directed. Secured With Conforming Stretch Gauze Bandage, Sterile 2x75 (in/in) Discharge Instruction: Secure with stretch gauze as directed. Compression Wrap Compression Stockings Add-Ons Electronic Signature(s) Signed: 04/30/2021 10:16:50 AM By: Lorrin Jackson Signed: 04/30/2021 5:26:08 PM By: Rhae Hammock RN Previous Signature: 04/30/2021  9:20:10 AM Version By: Sandre Kitty Entered By: Lorrin Jackson on 04/30/2021 10:16:49 -------------------------------------------------------------------------------- Vitals Details [name] 04/30/2021 8:45 A M Medical Record Number: [id number] Patient Account Number: [id number] Date of Birth/Sex: Treating RN: Jan 10, 1941 (80 y.o. [name], [name] Primary Care Shareka Casale: [name] Other Clinician: Referring Chemika Nightengale: Treating Brier Firebaugh/Extender: [name] Weeks in Treatment: 11 Vital Signs Time Taken: 09:16 Temperature (F): 98.3 Height (in): 69 Pulse (bpm): 85 Weight (lbs): 250 Respiratory Rate (breaths/min): 20 Body Mass Index (BMI): 36.9 Blood Pressure (mmHg): 144/81 Reference Range: 80 - 120 mg / dl Electronic Signature(s) Signed: 04/30/2021 9:20:10 AM By: Sandre Kitty Entered By: Sandre Kitty on 04/30/2021 09:16:16

## 2021-04-30 NOTE — Progress Notes (Signed)
[name], [name] ([id number]) Visit Report for 04/30/2021 SuperBill Details Patient Name: [name]: [name], [name]. 04/30/2021 Medical Record Number: [id number] Patient Account Number: [id number] Date of Birth/Sex: Treating RN: 1941/05/13 (80 y.o. 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

## 2021-05-02 ENCOUNTER — Encounter (HOSPITAL_BASED_OUTPATIENT_CLINIC_OR_DEPARTMENT_OTHER): Payer: Medicare Other | Admitting: Internal Medicine

## 2021-05-07 ENCOUNTER — Other Ambulatory Visit: Payer: Self-pay

## 2021-05-07 ENCOUNTER — Encounter (HOSPITAL_BASED_OUTPATIENT_CLINIC_OR_DEPARTMENT_OTHER): Payer: Medicare Other | Admitting: Internal Medicine

## 2021-05-07 DIAGNOSIS — M86171 Other acute osteomyelitis, right ankle and foot: Secondary | ICD-10-CM | POA: Diagnosis not present

## 2021-05-07 DIAGNOSIS — L97518 Non-pressure chronic ulcer of other part of right foot with other specified severity: Secondary | ICD-10-CM | POA: Diagnosis not present

## 2021-05-07 DIAGNOSIS — M48062 Spinal stenosis, lumbar region with neurogenic claudication: Secondary | ICD-10-CM | POA: Diagnosis not present

## 2021-05-07 NOTE — Progress Notes (Signed)
HORRIS, SPEROS ([id number]) Visit Report for 05/07/2021 Arrival Information Details Patient Name: [name]: WENDEL, HOMEYER [name] 05/07/2021 9:15 A M Medical Record Number: [id number] Patient Account Number: [id number] Date of Birth/Sex: Treating RN: Aug 19, 1941 (80 y.o. Ernestene Mention Primary Care Emberli Ballester: [name] Other Clinician: Referring Phylliss Strege: Treating Nicolo Tomko/Extender: [name] Weeks in Treatment: 12 Visit Information History Since Last Visit Added or deleted any medications: No Patient Arrived: Ambulatory Any new allergies or adverse reactions: No Arrival Time: 09:33 Had a fall or experienced change in No Accompanied By: self activities of daily living that may affect Transfer Assistance: None risk of falls: Patient Identification Verified: Yes Signs or symptoms of abuse/neglect since last visito No Secondary Verification Process Completed: Yes Hospitalized since last visit: No Patient Requires Transmission-Based Precautions: No Implantable device outside of the clinic excluding No Patient Has Alerts: Yes cellular tissue based products placed in the center Patient Alerts: R ABI non compressible since last visit: Has Dressing in Place as Prescribed: Yes Has Compression in Place as Prescribed: Yes Pain Present Now: No Electronic Signature(s) Signed: 05/07/2021 4:32:22 PM By: Baruch Gouty RN, BSN Entered By: Baruch Gouty on 05/07/2021 09:34:32 -------------------------------------------------------------------------------- Clinic Level of Care Assessment Details [name] 05/07/2021 9:15 A M Medical Record Number: [id number] Patient Account Number: [id number] Date of Birth/Sex: Treating RN: [birth year] (80 y.o. Ernestene Mention Primary Care Javeion Cannedy: [name] Other Clinician: Referring Tylik Treese: Treating Lancelot Alyea/Extender: [name] Weeks in  Treatment: 12 Clinic Level of Care Assessment Items TOOL 4 Quantity Score []  - 0 Use when only an EandM is performed on FOLLOW-UP visit ASSESSMENTS - Nursing Assessment / Reassessment X- 1 10 Reassessment of Co-morbidities (includes updates in patient status) X- 1 5 Reassessment of Adherence to Treatment Plan ASSESSMENTS - Wound and Skin A ssessment / Reassessment X - Simple Wound Assessment / Reassessment - one wound 1 5 []  - 0 Complex Wound Assessment / Reassessment - multiple wounds []  - 0 Dermatologic / Skin Assessment (not related to wound area) ASSESSMENTS - Focused Assessment []  - 0 Circumferential Edema Measurements - multi extremities []  - 0 Nutritional Assessment / Counseling / Intervention []  - 0 Lower Extremity Assessment (monofilament, tuning fork, pulses) []  - 0 Peripheral Arterial Disease Assessment (using hand held doppler) ASSESSMENTS - Ostomy and/or Continence Assessment and Care []  - 0 Incontinence Assessment and Management []  - 0 Ostomy Care Assessment and Management (repouching, etc.) PROCESS - Coordination of Care X - Simple Patient / Family Education for ongoing care 1 15 []  - 0 Complex (extensive) Patient / Family Education for ongoing care X- 1 10 Staff obtains Programmer, systems, Records, T Results / Process Orders est []  - 0 Staff telephones HHA, Nursing Homes / Clarify orders / etc []  - 0 Routine Transfer to another Facility (non-emergent condition) []  - 0 Routine Hospital Admission (non-emergent condition) []  - 0 New Admissions / Biomedical engineer / Ordering NPWT Apligraf, etc. , []  - 0 Emergency Hospital Admission (emergent condition) X- 1 10 Simple Discharge Coordination []  - 0 Complex (extensive) Discharge Coordination PROCESS - Special Needs []  - 0 Pediatric / Minor Patient Management []  - 0 Isolation Patient Management []  - 0 Hearing / Language / Visual special needs []  - 0 Assessment of Community assistance (transportation,  D/C planning, etc.) []  - 0 Additional assistance / Altered mentation []  - 0 Support Surface(s) Assessment (bed, cushion, seat, etc.) INTERVENTIONS - Wound Cleansing / Measurement  X - Simple Wound Cleansing - one wound 1 5 []  - 0 Complex Wound Cleansing - multiple wounds []  - 0 Wound Imaging (photographs - any number of wounds) []  - 0 Wound Tracing (instead of photographs) []  - 0 Simple Wound Measurement - one wound []  - 0 Complex Wound Measurement - multiple wounds INTERVENTIONS - Wound Dressings X - Small Wound Dressing one or multiple wounds 1 10 []  - 0 Medium Wound Dressing one or multiple wounds []  - 0 Large Wound Dressing one or multiple wounds X- 1 5 Application of Medications - topical []  - 0 Application of Medications - injection INTERVENTIONS - Miscellaneous []  - 0 External ear exam []  - 0 Specimen Collection (cultures, biopsies, blood, body fluids, etc.) []  - 0 Specimen(s) / Culture(s) sent or taken to Lab for analysis []  - 0 Patient Transfer (multiple staff / Civil Service fast streamer / Similar devices) []  - 0 Simple Staple / Suture removal (25 or less) []  - 0 Complex Staple / Suture removal (26 or more) []  - 0 Hypo / Hyperglycemic Management (close monitor of Blood Glucose) []  - 0 Ankle / Brachial Index (ABI) - do not check if billed separately X- 1 5 Vital Signs Has the patient been seen at the hospital within the last three years: Yes Total Score: 80 Level Of Care: New/Established - Level 3 Electronic Signature(s) Signed: 05/07/2021 4:32:22 PM By: Baruch Gouty RN, BSN Entered By: Baruch Gouty on 05/07/2021 09:38:36 -------------------------------------------------------------------------------- Encounter Discharge Information Details [name] 05/07/2021 9:15 A M Medical Record Number: [id number] Patient Account Number: [id number] Date of Birth/Sex: Treating RN: 1941/01/06 (80 y.o. Ernestene Mention Primary Care  Brycin Kille: [name] Other Clinician: Referring [place]ua: Treating Kala Gassmann/Extender: [name] Weeks in Treatment: 12 Encounter Discharge Information Items Discharge Condition: Stable Ambulatory Status: Ambulatory Discharge Destination: Home Transportation: Private Auto Accompanied By: self Schedule Follow-up Appointment: Yes Clinical Summary of Care: Patient Declined Electronic Signature(s) Signed: 05/07/2021 4:32:22 PM By: Baruch Gouty RN, BSN Entered By: Baruch Gouty on 05/07/2021 09:41:03 -------------------------------------------------------------------------------- Patient/Caregiver Education Details [name] 6/28/2022andnbsp9:15 A M Medical Record Number: [id number] Patient Account Number: [id number] Date of Birth/Gender: Treating RN: 1940/12/10 (80 y.o. Ernestene Mention Primary Care Physician: [name] Other Clinician: Referring Physician: Treating Physician/Extender: Leonard Downing in Treatment: 12 Education Assessment Education Provided To: Patient Education Topics Provided Wound/Skin Impairment: Methods: Explain/Verbal Responses: Reinforcements needed, State content correctly [employer]) Signed: 05/07/2021 4:32:22 PM By: Baruch Gouty RN, BSN Entered By: Baruch Gouty on 05/07/2021 09:40:44 -------------------------------------------------------------------------------- Wound Assessment Details [name] 05/07/2021 9:15 A M Medical Record Number: [id number] Patient Account Number: [id number] Date of Birth/Sex: Treating RN: 06/24/1941 (80 y.o. Ernestene Mention Primary Care Merla Sawka: [name] Other Clinician: Referring Braidyn Scorsone: Treating Hindy Perrault/Extender: [name] Weeks in Treatment: 12 Wound Status Wound Number: 1 Primary Neuropathic Ulcer-Non  Diabetic Etiology: Wound Location: Right T Great oe Wound Open Wounding Event: Other Lesion Status: Date Acquired: 01/10/2020 Comorbid Cataracts, Chronic sinus problems/congestion, Asthma, Sleep Weeks Of Treatment: 12 History: Apnea, Hypertension, Peripheral Venous Disease, Osteoarthritis Clustered Wound: No Wound Measurements Length: (cm) 0.1 Width: (cm) 0.1 Depth: (cm) 0.1 Area: (cm) 0.008 Volume: (cm) 0.001 % Reduction in Area: 99.2% % Reduction in Volume: 99.5% Epithelialization: Large (67-100%) Tunneling: No Undermining: No Wound Description Classification: Full Thickness With Exposed Support Structures Wound Margin: Well defined,  not attached Exudate Amount: Small Exudate Type: Serosanguineous Exudate Color: red, brown Foul Odor After Cleansing: No Slough/Fibrino No Wound Bed Granulation Amount: Small (1-33%) Exposed Structure Granulation Quality: Pink, Pale Fascia Exposed: No Necrotic Amount: None Present (0%) Fat Layer (Subcutaneous Tissue) Exposed: Yes Tendon Exposed: No Muscle Exposed: No Joint Exposed: No Bone Exposed: No Treatment Notes Wound #1 (Toe Great) Wound Laterality: Right Cleanser Wound Cleanser Discharge Instruction: Cleanse the wound with wound cleanser prior to applying a clean dressing using gauze sponges, not tissue or cotton balls. Peri-Wound Care Topical Primary Dressing PolyMem Silver Non-Adhesive Dressing, 4.25x4.25 in Discharge Instruction: Apply to wound bed as instructed Secondary Dressing Woven Gauze Sponges 2x2 in Discharge Instruction: Apply over primary dressing as directed. Secured With Conforming Stretch Gauze Bandage, Sterile 2x75 (in/in) Discharge Instruction: Secure with stretch gauze as directed. Compression Wrap Compression Stockings Add-Ons Electronic Signature(s) Signed: 05/07/2021 4:32:22 PM By: Baruch Gouty RN, BSN Entered By: Baruch Gouty on 05/07/2021  09:37:16 -------------------------------------------------------------------------------- Vitals Details [name] 05/07/2021 9:15 A M Medical Record Number: [id number] Patient Account Number: [id number] Date of Birth/Sex: Treating RN: 1941/05/20 (80 y.o. Ernestene Mention Primary Care Avi Archuleta: [name] Other Clinician: Referring Masami Plata: Treating Priest Lockridge/Extender: [name] Weeks in Treatment: 12 Vital Signs Time Taken: 09:35 Temperature (F): 98.1 Height (in): 69 Pulse (bpm): 90 Weight (lbs): 250 Respiratory Rate (breaths/min): 20 Body Mass Index (BMI): 36.9 Blood Pressure (mmHg): 158/81 Reference Range: 80 - 120 mg / dl Electronic Signature(s) Signed: 05/07/2021 4:32:22 PM By: Baruch Gouty RN, BSN Entered By: Baruch Gouty on 05/07/2021 09:36:48

## 2021-05-07 NOTE — Progress Notes (Signed)
RED, MANDT ([id number]) Visit Report for 05/07/2021 SuperBill Details Patient Name: [name]: RUPERTO, KIERNAN [name] 05/07/2021 Medical Record Number: [id number] Patient Account Number: [id number] Date of Birth/Sex: Treating RN: 1941-07-06 (80 y.o. 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

## 2021-05-09 ENCOUNTER — Other Ambulatory Visit: Payer: Self-pay

## 2021-05-09 ENCOUNTER — Encounter (HOSPITAL_BASED_OUTPATIENT_CLINIC_OR_DEPARTMENT_OTHER): Payer: Medicare Other | Admitting: Internal Medicine

## 2021-05-09 ENCOUNTER — Other Ambulatory Visit: Payer: Self-pay | Admitting: Urology

## 2021-05-09 DIAGNOSIS — M86171 Other acute osteomyelitis, right ankle and foot: Secondary | ICD-10-CM | POA: Diagnosis not present

## 2021-05-09 DIAGNOSIS — N400 Enlarged prostate without lower urinary tract symptoms: Secondary | ICD-10-CM

## 2021-05-09 DIAGNOSIS — M48062 Spinal stenosis, lumbar region with neurogenic claudication: Secondary | ICD-10-CM | POA: Diagnosis not present

## 2021-05-09 DIAGNOSIS — L97518 Non-pressure chronic ulcer of other part of right foot with other specified severity: Secondary | ICD-10-CM | POA: Diagnosis not present

## 2021-05-09 NOTE — Telephone Encounter (Signed)
Overdue for f/u appointment.

## 2021-05-14 ENCOUNTER — Encounter (HOSPITAL_BASED_OUTPATIENT_CLINIC_OR_DEPARTMENT_OTHER): Payer: Medicare Other | Attending: Internal Medicine | Admitting: Internal Medicine

## 2021-05-14 DIAGNOSIS — L97519 Non-pressure chronic ulcer of other part of right foot with unspecified severity: Secondary | ICD-10-CM | POA: Insufficient documentation

## 2021-05-14 DIAGNOSIS — M86179 Other acute osteomyelitis, unspecified ankle and foot: Secondary | ICD-10-CM | POA: Insufficient documentation

## 2021-05-14 DIAGNOSIS — Z833 Family history of diabetes mellitus: Secondary | ICD-10-CM | POA: Insufficient documentation

## 2021-05-14 NOTE — Progress Notes (Signed)
[name], [name] ([id number]) Visit Report for 05/09/2021 Arrival Information Details Patient Name: [name]: [name], [name] [name] 05/09/2021 9:30 A M Medical Record Number: [id number] Patient Account Number: [id number] Date of Birth/Sex: Treating RN: Jan 23, 1941 (80 y.o. [name], [name] Primary Care Beryle Bagsby: [name] Other Clinician: Referring Kurstyn Larios: Treating Massie Cogliano/Extender: Alean Rinne Weeks in Treatment: 71 Visit Information History Since Last Visit Added or deleted any medications: No Patient Arrived: Ambulatory Any new allergies or adverse reactions: No Arrival Time: 10:55 Had a fall or experienced change in No Accompanied By: self activities of daily living that may affect Transfer Assistance: None risk of falls: Patient Identification Verified: Yes Signs or symptoms of abuse/neglect since last visito No Secondary Verification Process Completed: Yes Hospitalized since last visit: No Patient Requires Transmission-Based Precautions: No Implantable device outside of the clinic excluding No Patient Has Alerts: Yes cellular tissue based products placed in the center Patient Alerts: R ABI non compressible since last visit: Has Dressing in Place as Prescribed: Yes Pain Present Now: No Electronic Signature(s) Signed: 05/10/2021 10:25:49 AM By: Sandre Kitty Entered By: Sandre Kitty on 05/09/2021 10:56:35 -------------------------------------------------------------------------------- Clinic Level of Care Assessment Details Patient Name: [name]: [name], [name] [name] 05/09/2021 9:30 A M Medical Record Number: [id number] Patient Account Number: [id number] Date of Birth/Sex: Treating RN: June 27, 1941 (80 y.o. [name] Primary Care Elijio Staples: [name] Other Clinician: Referring Tameshia Bonneville: Treating Jezel Basto/Extender: Alean Rinne Weeks in Treatment: 13 Clinic Level of Care Assessment  Items TOOL 4 Quantity Score X- 1 0 Use when only an EandM is performed on FOLLOW-UP visit ASSESSMENTS - Nursing Assessment / Reassessment X- 1 10 Reassessment of Co-morbidities (includes updates in patient status) X- 1 5 Reassessment of Adherence to Treatment Plan ASSESSMENTS - Wound and Skin A ssessment / Reassessment X - Simple Wound Assessment / Reassessment - one wound 1 5 []  - 0 Complex Wound Assessment / Reassessment - multiple wounds []  - 0 Dermatologic / Skin Assessment (not related to wound area) ASSESSMENTS - Focused Assessment []  - 0 Circumferential Edema Measurements - multi extremities []  - 0 Nutritional Assessment / Counseling / Intervention []  - 0 Lower Extremity Assessment (monofilament, tuning fork, pulses) []  - 0 Peripheral Arterial Disease Assessment (using hand held doppler) ASSESSMENTS - Ostomy and/or Continence Assessment and Care []  - 0 Incontinence Assessment and Management []  - 0 Ostomy Care Assessment and Management (repouching, etc.) PROCESS - Coordination of Care X - Simple Patient / Family Education for ongoing care 1 15 []  - 0 Complex (extensive) Patient / Family Education for ongoing care X- 1 10 Staff obtains Programmer, systems, Records, T Results / Process Orders est []  - 0 Staff telephones HHA, Nursing Homes / Clarify orders / etc []  - 0 Routine Transfer to another Facility (non-emergent condition) []  - 0 Routine Hospital Admission (non-emergent condition) []  - 0 New Admissions / Biomedical engineer / Ordering NPWT Apligraf, etc. , []  - 0 Emergency Hospital Admission (emergent condition) X- 1 10 Simple Discharge Coordination []  - 0 Complex (extensive) Discharge Coordination PROCESS - Special Needs []  - 0 Pediatric / Minor Patient Management []  - 0 Isolation Patient Management []  - 0 Hearing / Language / Visual special needs []  - 0 Assessment of Community assistance (transportation, D/C planning, etc.) []  - 0 Additional  assistance / Altered mentation []  - 0 Support Surface(s) Assessment (bed, cushion, seat, etc.) INTERVENTIONS - Wound Cleansing / Measurement X - Simple Wound Cleansing - one wound 1  5 []  - 0 Complex Wound Cleansing - multiple wounds X- 1 5 Wound Imaging (photographs - any number of wounds) []  - 0 Wound Tracing (instead of photographs) X- 1 5 Simple Wound Measurement - one wound []  - 0 Complex Wound Measurement - multiple wounds INTERVENTIONS - Wound Dressings X - Small Wound Dressing one or multiple wounds 1 10 []  - 0 Medium Wound Dressing one or multiple wounds []  - 0 Large Wound Dressing one or multiple wounds []  - 0 Application of Medications - topical []  - 0 Application of Medications - injection INTERVENTIONS - Miscellaneous []  - 0 External ear exam []  - 0 Specimen Collection (cultures, biopsies, blood, body fluids, etc.) []  - 0 Specimen(s) / Culture(s) sent or taken to Lab for analysis []  - 0 Patient Transfer (multiple staff / Civil Service fast streamer / Similar devices) []  - 0 Simple Staple / Suture removal (25 or less) []  - 0 Complex Staple / Suture removal (26 or more) []  - 0 Hypo / Hyperglycemic Management (close monitor of Blood Glucose) []  - 0 Ankle / Brachial Index (ABI) - do not check if billed separately X- 1 5 Vital Signs Has the patient been seen at the hospital within the last three years: Yes Total Score: 85 Level Of Care: New/Established - Level 3 Electronic Signature(s) Signed: 05/14/2021 7:10:28 PM By: Levan Hurst RN, BSN Entered By: Levan Hurst on 05/09/2021 17:13:51 -------------------------------------------------------------------------------- Encounter Discharge Information Details Patient Name: [name]: [name] Banker [name] 05/09/2021 9:30 A M Medical Record Number: [id number] Patient Account Number: [id number] Date of Birth/Sex: Treating RN: 1941/04/02 (80 y.o. [name] Primary Care Sundra Haddix: [name] Other  Clinician: Referring Kang Ishida: Treating Myreon Wimer/Extender: Alean Rinne Weeks in Treatment: 13 Encounter Discharge Information Items Discharge Condition: Stable Ambulatory Status: Ambulatory Discharge Destination: Home Transportation: Private Auto Accompanied By: alone Schedule Follow-up Appointment: Yes Clinical Summary of Care: Patient Declined Electronic Signature(s) Signed: 05/14/2021 7:10:28 PM By: Levan Hurst RN, BSN Entered By: Levan Hurst on 05/09/2021 17:14:23 -------------------------------------------------------------------------------- Wound Assessment Details Patient Name: [name]: [name] Banker [name] 05/09/2021 9:30 A M Medical Record Number: [id number] Patient Account Number: [id number] Date of Birth/Sex: Treating RN: [birth year] (80 y.o. 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 Peri-Wound Care Topical Primary Dressing PolyMem Silver Non-Adhesive Dressing, 4.25x4.25 in Discharge Instruction: Apply to wound bed as instructed Secondary Dressing Woven Gauze Sponges 2x2 in Discharge  Instruction: Apply over primary dressing as directed. Secured With Conforming Stretch Gauze Bandage, Sterile 2x75 (in/in) Discharge Instruction: Secure with stretch gauze as directed. Compression Wrap Compression Stockings Add-Ons Electronic Signature(s) Signed: 05/09/2021 5:32:03 PM By: Rhae Hammock RN Signed: 05/10/2021 10:25:49 AM By: Sandre Kitty Entered By: Sandre Kitty on 05/09/2021 10:57:05 -------------------------------------------------------------------------------- Vitals Details Patient Name: [name]: [name] Banker [name] 05/09/2021  9:30 A M Medical Record Number: [id number] Patient Account Number: [id number] Date of Birth/Sex: Treating RN: [birth year] (80 y.o. [name], [name] Primary Care Milla Wahlberg: [name] Other Clinician: Referring Rick Warnick: Treating Anelisse Jacobson/Extender: Alean Rinne Weeks in Treatment: 13 Vital Signs Time Taken: 10:56 Temperature (F): 98.2 Height (in): 69 Pulse (bpm): 81 Weight (lbs): 250 Respiratory Rate (breaths/min): 20 Body Mass Index (BMI): 36.9 Blood Pressure (mmHg): 145/75 Reference Range: 80 - 120 mg / dl Electronic Signature(s) Signed: 05/10/2021 10:25:49 AM By: Sandre Kitty Entered By: Sandre Kitty on 05/09/2021 10:56:53

## 2021-05-14 NOTE — Progress Notes (Signed)
MORGEN, RITACCO ([id number]) Visit Report for 05/09/2021 SuperBill Details Patient Name: [name]: [name], [name]. 05/09/2021 Medical Record Number: [id number] Patient Account Number: [id number] Date of Birth/Sex: Treating RN: [birth year] (80 y.o. 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

## 2021-05-16 ENCOUNTER — Encounter (HOSPITAL_BASED_OUTPATIENT_CLINIC_OR_DEPARTMENT_OTHER): Payer: Medicare Other | Admitting: Internal Medicine

## 2021-05-17 ENCOUNTER — Telehealth: Payer: Self-pay

## 2021-05-17 NOTE — Telephone Encounter (Signed)
Patient called he is requesting a call back:[phone]

## 2021-05-17 NOTE — Telephone Encounter (Signed)
Tried calling patient to discuss. No answer.  

## 2021-05-20 ENCOUNTER — Encounter (HOSPITAL_BASED_OUTPATIENT_CLINIC_OR_DEPARTMENT_OTHER): Payer: Medicare Other | Admitting: Internal Medicine

## 2021-05-20 ENCOUNTER — Telehealth: Payer: Self-pay | Admitting: Physical Medicine and Rehabilitation

## 2021-05-20 ENCOUNTER — Other Ambulatory Visit: Payer: Self-pay

## 2021-05-20 DIAGNOSIS — N401 Enlarged prostate with lower urinary tract symptoms: Secondary | ICD-10-CM | POA: Diagnosis not present

## 2021-05-20 DIAGNOSIS — L97519 Non-pressure chronic ulcer of other part of right foot with unspecified severity: Secondary | ICD-10-CM | POA: Diagnosis not present

## 2021-05-20 DIAGNOSIS — M86179 Other acute osteomyelitis, unspecified ankle and foot: Secondary | ICD-10-CM | POA: Diagnosis not present

## 2021-05-20 DIAGNOSIS — Z833 Family history of diabetes mellitus: Secondary | ICD-10-CM | POA: Diagnosis not present

## 2021-05-20 DIAGNOSIS — L97518 Non-pressure chronic ulcer of other part of right foot with other specified severity: Secondary | ICD-10-CM | POA: Diagnosis not present

## 2021-05-20 DIAGNOSIS — E291 Testicular hypofunction: Secondary | ICD-10-CM | POA: Diagnosis not present

## 2021-05-20 DIAGNOSIS — R3915 Urgency of urination: Secondary | ICD-10-CM | POA: Diagnosis not present

## 2021-05-20 DIAGNOSIS — M48062 Spinal stenosis, lumbar region with neurogenic claudication: Secondary | ICD-10-CM | POA: Diagnosis not present

## 2021-05-20 NOTE — Progress Notes (Addendum)
[name], Hanson ([id number]) Visit Report for 05/20/2021 Arrival Information Details Patient Name: [name]: [name], Hanson [name] 05/20/2021 2:00 PM Medical Record Number: [id number] Patient Account Number: [id number] Date of Birth/Sex: Treating RN: July 27, 1941 (80 y.o. [name], [name] Primary Care Stanford Strauch: [name] Other Clinician: Referring Iveth Heidemann: Treating Alphonsa Brickle/Extender: Alean Rinne Weeks in Treatment: 14 Visit Information History Since Last Visit Added or deleted any medications: No Patient Arrived: Ambulatory Any new allergies or adverse reactions: No Arrival Time: 14:30 Had a fall or experienced change in No Accompanied By: self activities of daily living that may affect Transfer Assistance: None risk of falls: Patient Identification Verified: Yes Signs or symptoms of abuse/neglect since last visito No Secondary Verification Process Completed: Yes Hospitalized since last visit: No Patient Requires Transmission-Based Precautions: No Implantable device outside of the clinic excluding No Patient Has Alerts: Yes cellular tissue based products placed in the center Patient Alerts: R ABI non compressible since last visit: Has Dressing in Place as Prescribed: Yes Pain Present Now: No Electronic Signature(s) Signed: 05/20/2021 5:12:13 PM By: Rhae Hammock RN Entered By: Rhae Hammock on 05/20/2021 14:30:49 -------------------------------------------------------------------------------- Clinic Level of Care Assessment Details Patient Name: [name]: [name] [name] 05/20/2021 2:00 PM Medical Record Number: [id number] Patient Account Number: [id number] Date of Birth/Sex: Treating RN: 08-30-1941 (80 y.o. [name] Primary Care Tatsuo Musial: [name] Other Clinician: Referring Sayeed Weatherall: Treating Porshia Blizzard/Extender: Alean Rinne Weeks in Treatment: 14 Clinic Level of Care Assessment  Items TOOL 4 Quantity Score X- 1 0 Use when only an EandM is performed on FOLLOW-UP visit ASSESSMENTS - Nursing Assessment / Reassessment X- 1 10 Reassessment of Co-morbidities (includes updates in patient status) X- 1 5 Reassessment of Adherence to Treatment Plan ASSESSMENTS - Wound and Skin A ssessment / Reassessment X - Simple Wound Assessment / Reassessment - one wound 1 5 _0  - 0 Complex Wound Assessment / Reassessment - multiple wounds _1  - 0 Dermatologic / Skin Assessment (not related to wound area) ASSESSMENTS - Focused Assessment _2  - 0 Circumferential Edema Measurements - multi extremities _3  - 0 Nutritional Assessment / Counseling / Intervention _4  - 0 Lower Extremity Assessment (monofilament, tuning fork, pulses) _5  - 0 Peripheral Arterial Disease Assessment (using hand held doppler) ASSESSMENTS - Ostomy and/or Continence Assessment and Care _6  - 0 Incontinence Assessment and Management _7  - 0 Ostomy Care Assessment and Management (repouching, etc.) PROCESS - Coordination of Care X - Simple Patient / Family Education for ongoing care 1 15 _8  - 0 Complex (extensive) Patient / Family Education for ongoing care _9  - 0 Staff obtains Programmer, systems, Records, T Results / Process Orders est _10  - 0 Staff telephones HHA, Nursing Homes / Clarify orders / etc _11  - 0 Routine Transfer to another Facility (non-emergent condition) _12  - 0 Routine Hospital Admission (non-emergent condition) _13  - 0 New Admissions / Biomedical engineer / Ordering NPWT Apligraf, etc. , _14  - 0 Emergency Hospital Admission (emergent condition) _15  - 0 Simple Discharge Coordination _16  - 0 Complex (extensive) Discharge Coordination PROCESS - Special Needs _17  - 0 Pediatric / Minor Patient Management _18  - 0 Isolation Patient Management _19  - 0 Hearing / Language / Visual special needs _20  - 0 Assessment of Community assistance (transportation, D/C planning, etc.) _21  - 0 Additional assistance  / Altered mentation _22  - 0 Support Surface(s) Assessment (bed, cushion, seat, etc.) INTERVENTIONS - Wound Cleansing / Measurement X - Simple Wound Cleansing - one wound 1 5 _23  -  0 Complex Wound Cleansing - multiple wounds X- 1 5 Wound Imaging (photographs - any number of wounds) _0  - 0 Wound Tracing (instead of photographs) X- 1 5 Simple Wound Measurement - one wound _1  - 0 Complex Wound Measurement - multiple wounds INTERVENTIONS - Wound Dressings X - Small Wound Dressing one or multiple wounds 1 10 _2  - 0 Medium Wound Dressing one or multiple wounds _3  - 0 Large Wound Dressing one or multiple wounds <[id number]>_4<\/QIHKVQQVZDGLOVFI>_4  - 0 Application of Medications - topical <PPIRJJOACZYSAYTK>_1<\/SWFUXNATFTDDUKGU>_5  - 0 Application of Medications - injection INTERVENTIONS - Miscellaneous _6  - 0 External ear exam _7  - 0 Specimen Collection (cultures, biopsies, blood, body fluids, etc.) _8  - 0 Specimen(s) / Culture(s) sent or taken to Lab for analysis _9  - 0 Patient Transfer (multiple staff / Civil Service fast streamer / Similar devices) _10  - 0 Simple Staple / Suture removal (25 or less) _11  - 0 Complex Staple / Suture removal (26 or more) _12  - 0 Hypo / Hyperglycemic Management (close monitor of Blood Glucose) _13  - 0 Ankle / Brachial Index (ABI) - do not check if billed separately X- 1 5 Vital Signs Has the patient been seen at the hospital within the last three years: Yes Total Score: 65 Level Of Care: New/Established - Level 2 Electronic Signature(s) Signed: 05/20/2021 5:19:06 PM By: Lorrin Jackson Entered By: Lorrin Jackson on 05/20/2021 15:17:15 -------------------------------------------------------------------------------- Lower Extremity Assessment Details Patient Name: [name]: [name] [name] 05/20/2021 2:00 PM Medical Record Number: [id number] Patient Account Number: [id number] Date of Birth/Sex: Treating RN: [birth year] (80 y.o. [name], [name] Primary Care Jaquasha Carnevale: [name] Other Clinician: Referring  Johnatha Zeidman: Treating Watt Geiler/Extender: Alean Rinne Weeks in Treatment: 14 Edema Assessment Assessed: [name]: No] [name]: Yes] Edema: [Left: Ye] [Right: s] Calf Left: Right: Point of Measurement: 33 cm From Medial Instep 39 cm Ankle Left: Right: Point of Measurement: 10 cm From Medial Instep 2.5 cm Vascular Assessment Pulses: Dorsalis Pedis Palpable: [Right:Yes] Posterior Tibial Palpable: [Right:Yes] Electronic Signature(s) Signed: 05/20/2021 5:12:13 PM By: Rhae Hammock RN Entered By: Rhae Hammock on 05/20/2021 14:32:14 -------------------------------------------------------------------------------- Multi Wound Chart Details Patient Name: [name]: [name] [name] 05/20/2021 2:00 PM Medical Record Number: [id number] Patient Account Number: [id number] Date of Birth/Sex: Treating RN: 01/11/1941 (80 y.o. [name] Primary Care Merrill Deanda: [name] Other Clinician: Referring Lashawnda Hancox: Treating Winter Jocelyn/Extender: Alean Rinne Weeks in Treatment: 14 Vital Signs Height(in): 69 Pulse(bpm): 74 Weight(lbs): 250 Blood Pressure(mmHg): 135/74 Body Mass Index(BMI): 37 Temperature(F): 97.7 Respiratory Rate(breaths/min): 17 Photos: [1:No Photos Right T Great oe] [N/A:N/A N/A] Wound Location: [1:Other Lesion] [N/A:N/A] Wounding Event: [1:Neuropathic Ulcer-Non Diabetic] [N/A:N/A] Primary Etiology: [1:Cataracts, Chronic sinus] [N/A:N/A] Comorbid History: [1:problems/congestion, Asthma, Sleep Apnea, Hypertension, Peripheral Venous Disease, Osteoarthritis 01/10/2020] [N/A:N/A] Date Acquired: [1:14] [N/A:N/A] Weeks of Treatment: [1:Open] [N/A:N/A] Wound Status: [1:0.1x0.1x0.1] [N/A:N/A] Measurements L x W x D (cm) [1:0.008] [N/A:N/A] A (cm) : rea [1:0.001] [N/A:N/A] Volume (cm) : [1:99.20%] [N/A:N/A] % Reduction in Area: [1:99.50%] [N/A:N/A] % Reduction in Volume: [1:Full Thickness With Exposed Support  N/A] Classification: [1:Structures Medium] [N/A:N/A] Exudate Amount: [1:Serosanguineous] [N/A:N/A] Exudate Type: [1:red, brown] [N/A:N/A] Exudate Color: [1:Distinct, outline attached] [N/A:N/A] Wound Margin: [1:Large (67-100%)] [N/A:N/A] Granulation Amount: [1:Red, Pink] [N/A:N/A] Granulation Quality: [1:None Present (0%)] [N/A:N/A] Necrotic Amount: [1:Fat Layer (Subcutaneous Tissue): Yes N/A] Exposed Structures: [1:Fascia: No Tendon: No Muscle: No Joint: No Bone: No None] [N/A:N/A] Treatment Notes Electronic Signature(s) Signed: 05/20/2021 3:58:30 PM By: Kalman Shan DO Signed: 05/20/2021 5:19:06 PM By: Onnie Boer  Lenna Sciara By: Kalman Shan on 05/20/2021 15:52:36 -------------------------------------------------------------------------------- Multi-Disciplinary Care Plan Details Patient Name: [name]: TUVIA, WOODRICK [name] 05/20/2021 2:00 PM Medical Record Number: [id number] Patient Account Number: [id number] Date of Birth/Sex: Treating RN: [birth year] (80 y.o. [name] Primary Care Caasi Giglia: [name] Other Clinician: Referring Fleda Pagel: Treating Dequavius Kuhner/Extender: Alean Rinne Weeks in Treatment: 14 Active Inactive Wound/Skin Impairment Nursing Diagnoses: Knowledge deficit related to ulceration/compromised skin integrity Goals: Patient/caregiver will verbalize understanding of skin care regimen Date Initiated: 02/07/2021 Date Inactivated: 02/28/2021 Target Resolution Date: 03/08/2021 Goal Status: Met Ulcer/skin breakdown will heal within 14 weeks Date Initiated: 05/20/2021 Target Resolution Date: 06/03/2021 Goal Status: Active Interventions: Assess patient/caregiver ability to obtain necessary supplies Assess patient/caregiver ability to perform ulcer/skin care regimen upon admission and as needed Provide education on ulcer and skin care Treatment Activities: Skin care regimen initiated : 02/07/2021 Topical wound  management initiated : 02/07/2021 Notes: Electronic Signature(s) Signed: 05/20/2021 5:19:06 PM By: Lorrin Jackson Entered By: Lorrin Jackson on 05/20/2021 15:18:14 -------------------------------------------------------------------------------- Pain Assessment Details Patient Name: [name]: [name] [name] 05/20/2021 2:00 PM Medical Record Number: [id number] Patient Account Number: [id number] Date of Birth/Sex: Treating RN: May 13, 1941 (80 y.o. [name], [name] Primary Care Yavonne Kiss: [name] Other Clinician: Referring Irisa Grimsley: Treating Siraj Dermody/Extender: Alean Rinne Weeks in Treatment: 14 Active Problems Location of Pain Severity and Description of Pain Patient Has Paino No Site Locations Pain Management and Medication Current Pain Management: Electronic Signature(s) Signed: 05/20/2021 5:12:13 PM By: Rhae Hammock RN Entered By: Rhae Hammock on 05/20/2021 14:32:04 -------------------------------------------------------------------------------- Patient/Caregiver Education Details Patient Name: [name]: [name] [name] 7/11/2022andnbsp2:00 PM Medical Record Number: [id number] Patient Account Number: [id number] Date of Birth/Gender: Treating RN: August 23, 1941 (80 y.o. [name] Primary Care Physician: [name] Other Clinician: Referring Physician: Treating Physician/Extender: Alean Rinne Weeks in Treatment: 14 Education Assessment Education Provided To: Patient Education Topics Provided Wound/Skin Impairment: Methods: Explain/Verbal, Printed Responses: State content correctly [employer]) Signed: 05/20/2021 5:19:06 PM By: Lorrin Jackson Entered By: Lorrin Jackson on 05/20/2021 15:16:35 -------------------------------------------------------------------------------- Wound Assessment Details Patient Name: [name]: [name] [name] 05/20/2021 2:00  PM Medical Record Number: [id number] Patient Account Number: [id number] Date of Birth/Sex: Treating RN: September 23, 1941 (80 y.o. [name] Primary Care Marcas Bowsher: [name] Other Clinician: Referring Poet Hineman: Treating Octavian Godek/Extender: Alean Rinne Weeks in Treatment: 14 Wound Status Wound Number: 1 Primary Neuropathic Ulcer-Non Diabetic Etiology: Wound Location: Right T Great oe Wound Open Wounding Event: Other Lesion Status: Date Acquired: 01/10/2020 Comorbid Cataracts, Chronic sinus problems/congestion, Asthma, Sleep Weeks Of Treatment: 14 History: Apnea, Hypertension, Peripheral Venous Disease, Osteoarthritis Clustered Wound: No Photos Wound Measurements Length: (cm) 0.1 Width: (cm) 0.1 Depth: (cm) 0.1 Area: (cm) 0.008 Volume: (cm) 0.001 % Reduction in Area: 99.2% % Reduction in Volume: 99.5% Epithelialization: None Tunneling: No Undermining: No Wound Description Classification: Full Thickness With Exposed Support Structures Wound Margin: Distinct, outline attached Exudate Amount: Medium Exudate Type: Serosanguineous Exudate Color: red, brown Foul Odor After Cleansing: No Slough/Fibrino No Wound Bed Granulation Amount: Large (67-100%) Exposed Structure Granulation Quality: Red, Pink Fascia Exposed: No Necrotic Amount: None Present (0%) Fat Layer (Subcutaneous Tissue) Exposed: Yes Tendon Exposed: No Muscle Exposed: No Joint Exposed: No Bone Exposed: No Electronic Signature(s) Signed: 05/21/2021 2:49:47 PM By: Sandre Kitty Signed: 05/21/2021 5:30:58 PM By: Lorrin Jackson Previous Signature: 05/20/2021 5:19:06 PM Version By: Lorrin Jackson Entered By: Sandre Kitty on 05/21/2021  11:41:32 -------------------------------------------------------------------------------- Vitals Details Patient Name: [name]: RANVEER, WAHLSTROM [name] 05/20/2021 2:00 PM Medical Record Number: [id number] Patient Account Number:  [id number] Date of Birth/Sex: Treating RN: Nov 22, 1940 (80 y.o. [name], [name] Primary Care Damien Cisar: [name] Other Clinician: Referring Shadrach Bartunek: Treating Monserratt Knezevic/Extender: Alean Rinne Weeks in Treatment: 14 Vital Signs Time Taken: 14:31 Temperature (F): 97.7 Height (in): 69 Pulse (bpm): 74 Weight (lbs): 250 Respiratory Rate (breaths/min): 17 Body Mass Index (BMI): 36.9 Blood Pressure (mmHg): 135/74 Reference Range: 80 - 120 mg / dl Electronic Signature(s) Signed: 05/20/2021 5:12:13 PM By: Rhae Hammock RN Entered By: Rhae Hammock on 05/20/2021 14:31:46

## 2021-05-20 NOTE — Progress Notes (Signed)
ELMO, RIO ([id number]) Visit Report for 05/20/2021 Chief Complaint Document Details Patient Name: [name]: NIKOS, ANGLEMYER [name] 05/20/2021 2:00 PM Medical Record Number: [id number] Patient Account Number: [id number] Date of Birth/Sex: Treating RN: [birth year] (80 y.o. [name] Primary Care Provider: [name] Other Clinician: Referring Provider: Treating Provider/Extender: Alean Rinne Weeks in Treatment: 14 Information Obtained from: Patient Chief Complaint 02/07/2021; patient is here for review of a wound on the tip of his right great toe Electronic Signature(s) Signed: 05/20/2021 3:58:30 PM By: Kalman Shan DO Entered By: Kalman Shan on 05/20/2021 15:52:47 -------------------------------------------------------------------------------- HPI Details [name] 05/20/2021 2:00 PM Medical Record Number: [id number] Patient Account Number: [id number] Date of Birth/Sex: Treating RN: [birth year] (80 y.o. [name] Primary Care Provider: [name] Other Clinician: Referring Provider: Treating Provider/Extender: Alean Rinne Weeks in Treatment: 14 History of Present Illness HPI Description: ADMISSION 02/07/2021 This is a 80 year old man who is not a diabetic "prediabetic" who comes in today letter asking [place] to look at an area on his right first toe tip that has been there about a year. He has been getting this dressed that [employer] and he had Xeroform on this with gauze. The patient is not exactly sure how this came about but he is not able to dress the wound himself because of limitations due to what sounds like spinal stenosis and pain. He had a wound on the ankle as well but that healed over. He was seen by vascular in May 2021. This was related to a right great toe wound. He had an angiogram that was completely normal on both sides good perfusion right into the  feet. He also looks like he has lymphedema and he has compression stockings but he cannot get them on and off so he essentially leaves he is on even when he showering Past medical history includes prediabetes, low back pain secondary I think the lumbar spinal stenosis has been offered a decompressive laminectomy but he has not gone forth with this. 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 ABI was noncompressible in our clinic on the right right first toe. 4/7; patient I admitted the clinic last week. He has an area on the tip of his right first toe. We cleaned this off last week and have been using silver collagen. He is in the same crocs today that he wore last week and I told him not to use. For 1 reason or another he could not handle a surgical sandal. 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 He is not a diabetic 4/12; right first toe wound chronic. We have been using silver collagen. Once again he comes in then the same pair of old crocs that he comes in every week. 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 His x-ray suggested suspicious for osteomyelitis with bone irregularity at the tuft of the right great toe 4/21; patient has been using silver collagen every other day to the wound on his right great toe. He continues to wear crocs. He has no complaints or issues today. 5/5; 2-week follow-up. 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 Mild soft tissue edema with enhancement at the distal aspect of the great toe  suggestive of cellulitis. Fortunately the wound actually looks better. Has been using Hydrofera Blue 5/12; started him on Augmentin last week 875 twice daily he is tolerating this well. Last creatinine I see is 1.77 in early April. We are using Hydrofera Blue to the wound which actually  looks better today. 5/17; tolerating the Augmentin well. He says the wound is actually been hurting him episodically. Actually the surface of this looks quite good. He did not get his lab work done. We are using [facility] he is changing this himself 6/2; still taking Augmentin he should be rounding into his last week. This was empirically for underlying osteomyelitis. The area on the tip of his toe is still open still with some depth but no palpable bone. We have been using Hydrofera Blue. Surface area of the wound has not been making much improvement Is work was essentially unremarkable except for a creatinine of 1.79. Reason for his renal insufficiency is not clear he is not a diabetic does not have hypertension however he does take NSAIDs and Lasix I wonder if this is the issue here. Note that his creatinine on 11/12/2018 was 1.45. Platelet count slightly low at 143 however his inflammatory markers were really in the normal range at 10 and 2 sedimentation rate and C-reactive protein respectively. 6/9; he apparently had another 2 weeks of Augmentin after we look that this last time. Therefore he should be coming into his last week next week. We are using polymen on the tip of the toe. He cannot get down to do the dressing so he comes in for a nurse visit. He comes in in crocs I have warned him against this although he says he is using a different shoe for most of the day he works in [employer]. 6/16; he comes back in in crocs. I have spoken him about this before. His areas on the tip of his toe this actually looks quite good we have been using polymen. He has completed his antibiotics which we empirically gave him for underlying osteomyelitis. Everything looks better to me in spite of his noncompliance with footwear recommendation 7/11; patient has been followed through nurse visits. He was last seen by provider on 6/16. He reports improvement to the wound with the use of PolyMem. He denies  signs of infection. Electronic Signature(s) Signed: 05/20/2021 3:58:30 PM By: Kalman Shan DO Entered By: Kalman Shan on 05/20/2021 15:53:42 -------------------------------------------------------------------------------- Physical Exam Details [name] 05/20/2021 2:00 PM Medical Record Number: [id number] Patient Account Number: [id number] Date of Birth/Sex: Treating RN: August 14, 1941 (80 y.o. [name] Primary Care Provider: [name] Other Clinician: Referring Provider: Treating Provider/Extender: Alean Rinne Weeks in Treatment: 14 Constitutional respirations regular, non-labored and within target range for patient.. Cardiovascular 2+ dorsalis pedis/posterior tibialis pulses. Psychiatric pleasant and cooperative. Notes Right great toe with scab. After scab removed just a very small area of granulation tissue present. This is pretty much closed. Electronic Signature(s) Signed: 05/20/2021 3:58:30 PM By: Kalman Shan DO Entered By: Kalman Shan on 05/20/2021 15:55:24 -------------------------------------------------------------------------------- Physician Orders Details [name] 05/20/2021 2:00 PM Medical Record Number: [id number] Patient Account Number: [id number] Date of Birth/Sex: Treating RN: 1941/08/02 (80 y.o. 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 - Dr. Dellia Nims Return A Edema Control - Lymphedema / SCD / Other Elevate legs to  the level of the heart or above for 30 minutes daily and/or when sitting, a frequency of: - throughout the day. Avoid standing for long periods of time. Exercise regularly Moisturize legs daily. - both legs every night before bed. Off-Loading Open toe surgical shoe to: - wound center clinic to provide surgical shoe to patient to wear while walking and standing to right foot. Other: - Stop wearing the Croc slip on shoes. 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 Ensure no pressure of on the tip right great toe. Wound Treatment Wound #1 - T Great oe Wound Laterality: Right Cleanser: Wound Cleanser Every Other Day Discharge Instructions: Cleanse the wound with wound cleanser prior to applying a clean dressing using gauze sponges, not tissue or cotton balls. Prim Dressing: PolyMem Silver Non-Adhesive Dressing, 4.25x4.25 in Every Other Day ary Discharge Instructions: Apply to wound bed as instructed Secondary Dressing: Woven Gauze Sponges 2x2 in Every Other Day Discharge Instructions: Apply over primary dressing as directed. Secured With: Child psychotherapist, Sterile 2x75 (in/in) Every Other Day Discharge Instructions: Secure with stretch gauze as directed. Electronic Signature(s) Signed: 05/20/2021 3:58:30 PM By: Kalman Shan DO Entered By: Kalman Shan on 05/20/2021 15:55:40 -------------------------------------------------------------------------------- Problem List Details [name] 05/20/2021 2:00 PM Medical Record Number: [id number] Patient Account Number: [id number] Date of Birth/Sex: Treating RN: 05-05-1941 (80 y.o. 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 05/20/2021 2:00 PM Medical Record Number: [id number] Patient Account Number: [id number] Date of Birth/Sex: Treating RN: 12/15/1940 (80 y.o. [name] Primary Care Provider: [name] Other Clinician: Referring Provider: Treating Provider/Extender: Alean Rinne Weeks in Treatment: 14 Subjective Chief Complaint Information obtained from Patient 02/07/2021; patient is here for review of a wound on the tip of his right great toe History of Present Illness (HPI) ADMISSION 02/07/2021 This is a 80 year old man who is not a diabetic "prediabetic" who comes in today letter asking [place] to look at an area on his right first toe tip that has been there about a year. He has been getting this dressed that [employer] and he had Xeroform on this with gauze. The patient is not exactly sure how this came about but he is not able to dress the wound himself because of limitations due to what sounds like spinal stenosis and pain. He had a wound on the ankle as well but that healed over. He was seen by vascular in May 2021. This was related to a right great toe wound. He had an angiogram that was completely normal on both sides good perfusion right into the  feet. He also looks like he has lymphedema and he has compression stockings but he cannot get them on and off so he essentially leaves  he is on even when he showering Past medical history includes prediabetes, low back pain secondary I think the lumbar spinal stenosis has been offered a decompressive laminectomy but he has not gone forth with this. 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 ABI was noncompressible in our clinic on the right right first toe. 4/7; patient I admitted the clinic last week. He has an area on the tip of his right first toe. We cleaned this off last week and have been using silver collagen. He is in the same crocs today that he wore last week and I told him not to use. For 1 reason or another he could not handle a surgical sandal. 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 He is not a diabetic 4/12; right first toe wound chronic. We have been using silver collagen. Once again he comes in then the same pair of old crocs that he comes in every week. 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 His x-ray suggested suspicious for osteomyelitis with bone irregularity at the tuft of the right great toe 4/21; patient has been using silver collagen every other day to the wound on his right great toe. He continues to wear crocs. He has no complaints or issues today. 5/5; 2-week follow-up. 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 Mild soft tissue edema with enhancement at the distal aspect of the great toe suggestive of cellulitis. Fortunately the wound actually looks better. Has been using Hydrofera Blue 5/12; started him on Augmentin last week 875 twice daily he is tolerating this well. Last creatinine I see is 1.77 in early April. We are using Hydrofera Blue to the wound which actually looks better today. 5/17; tolerating the Augmentin well. He says the wound is actually been hurting him episodically. Actually the surface  of this looks quite good. He did not get his lab work done. We are using [facility] he is changing this himself 6/2; still taking Augmentin he should be rounding into his last week. This was empirically for underlying osteomyelitis. The area on the tip of his toe is still open still with some depth but no palpable bone. We have been using Hydrofera Blue. Surface area of the wound has not been making much improvement Is work was essentially unremarkable except for a creatinine of 1.79. Reason for his renal insufficiency is not clear he is not a diabetic does not have hypertension however he does take NSAIDs and Lasix I wonder if this is the issue here. Note that his creatinine on 11/12/2018 was 1.45. Platelet count slightly low at 143 however his inflammatory markers were really in the normal range at 10 and 2 sedimentation rate and C-reactive protein respectively. 6/9; he apparently had another 2 weeks of Augmentin after we look that this last time. Therefore he should be coming into his last week next week. We are using polymen on the tip of the toe. He cannot get down to do the dressing so he comes in for a nurse visit. He comes in in crocs I have warned him against this although he says he is using a different shoe  for most of the day he works in [employer]. 6/16; he comes back in in crocs. I have spoken him about this before. His areas on the tip of his toe this actually looks quite good we have been using polymen. He has completed his antibiotics which we empirically gave him for underlying osteomyelitis. Everything looks better to me in spite of his noncompliance with footwear recommendation 7/11; patient has been followed through nurse visits. He was last seen by provider on 6/16. He reports improvement to the wound with the use of PolyMem. He denies signs of infection. Patient History Information obtained from Patient. Family History Diabetes - Maternal Grandparents, Heart Disease -  Father, Hypertension - Father, Stroke - Father, Thyroid Problems - Mother,Child, No family history of Cancer, Hereditary Spherocytosis, Kidney Disease, Lung Disease, Seizures, Tuberculosis. Social History Never smoker, Marital Status - Single, Alcohol Use - Rarely, Drug Use - No History, Caffeine Use - Daily - coffee. Medical History Eyes Patient has history of Cataracts - 2019 Ear/Nose/Mouth/Throat Patient has history of Chronic sinus problems/congestion Respiratory Patient has history of Asthma, Sleep Apnea Cardiovascular Patient has history of Hypertension, Peripheral Venous Disease Musculoskeletal Patient has history of Osteoarthritis Medical A Surgical History Notes nd Gastrointestinal GERD Genitourinary BPH Objective Constitutional respirations regular, non-labored and within target range for patient.. Vitals Time Taken: 2:31 PM, Height: 69 in, Weight: 250 lbs, BMI: 36.9, Temperature: 97.7 F, Pulse: 74 bpm, Respiratory Rate: 17 breaths/min, Blood Pressure: 135/74 mmHg. Cardiovascular 2+ dorsalis pedis/posterior tibialis pulses. Psychiatric pleasant and cooperative. General Notes: Right great toe with scab. After scab removed just a very small area of granulation tissue present. This is pretty much closed. Integumentary (Hair, Skin) Wound #1 status is Open. Original cause of wound was Other Lesion. The date acquired was: 01/10/2020. The wound has been in treatment 14 weeks. The wound is located on the Right T Great. The wound measures 0.1cm length x 0.1cm width x 0.1cm depth; 0.008cm^2 area and 0.001cm^3 volume. There is Fat Layer oe (Subcutaneous Tissue) exposed. There is no tunneling or undermining noted. There is a medium amount of serosanguineous drainage noted. The wound margin is distinct with the outline attached to the wound base. There is large (67-100%) red, pink granulation within the wound bed. There is no necrotic tissue within the wound bed. 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 After scab was removed there is a small area of granulation tissue present however this appears almost closed. No signs of infection on exam. I think he will benefit from another week of PolyMem silver and aggressive offloading. He can follow-up next week. Plan Follow-up Appointments: Return Appointment in 1 week. - Dr. Dellia Nims Edema Control - Lymphedema / SCD / Other: Elevate legs to the level of the heart or above for 30 minutes daily and/or when sitting, a frequency of: - throughout the day. Avoid standing for long periods of time. Exercise regularly Moisturize legs daily. - both legs every night before bed. Off-Loading: Open toe surgical shoe to: - wound center clinic to provide surgical shoe to patient to wear while walking and standing to right foot. Other: - Stop wearing the Croc slip on shoes. 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 Ensure no pressure of on the tip right great toe. WOUND #1: Darien Ramus  oe Wound Laterality: Right Cleanser: Wound Cleanser Every Other Day/ Discharge Instructions: Cleanse the wound with wound cleanser prior to applying a clean dressing using gauze sponges, not tissue or cotton balls. Prim Dressing: PolyMem Silver Non-Adhesive Dressing, 4.25x4.25 in Every Other Day/ ary Discharge Instructions: Apply to wound bed as instructed Secondary Dressing: Woven Gauze Sponges 2x2 in Every Other Day/ Discharge Instructions: Apply over primary dressing as directed. Secured With: Child psychotherapist, Sterile 2x75 (in/in) Every Other Day/ Discharge Instructions: Secure with stretch gauze as directed. 1. PolyMem silver 2. Aggressive offloading 3. Follow-up in 1 week Electronic Signature(s) Signed:  05/20/2021 3:58:30 PM By: Kalman Shan DO Entered By: Kalman Shan on 05/20/2021 15:57:06 -------------------------------------------------------------------------------- HxROS Details [name] 05/20/2021 2:00 PM Medical Record Number: [id number] Patient Account Number: [id number] Date of Birth/Sex: Treating RN: [birth year] (80 y.o. [name] Primary Care Provider: [name] Other Clinician: Referring Provider: Treating Provider/Extender: Alean Rinne Weeks in Treatment: 14 Information Obtained From Patient Eyes Medical History: Positive for: Cataracts - 2019 Ear/Nose/Mouth/Throat Medical History: Positive for: Chronic sinus problems/congestion Respiratory Medical History: Positive for: Asthma; Sleep Apnea Cardiovascular Medical History: Positive for: Hypertension; Peripheral Venous Disease Gastrointestinal Medical History: Past Medical History Notes: GERD Genitourinary Medical History: Past Medical History Notes: BPH Musculoskeletal Medical History: Positive for: Osteoarthritis HBO Extended History Items Ear/Nose/Mouth/Throat: Eyes: Chronic sinus Cataracts problems/congestion Immunizations Pneumococcal Vaccine: Received Pneumococcal Vaccination: Yes Implantable Devices None Family and Social History Cancer: No; Diabetes: Yes - Maternal Grandparents; Heart Disease: Yes - Father; Hereditary Spherocytosis: No; Hypertension: Yes - Father; Kidney Disease: No; Lung Disease: No; Seizures: No; Stroke: Yes - Father; Thyroid Problems: Yes - Mother,Child; Tuberculosis: No; Never smoker; Marital Status - Single; Alcohol Use: Rarely; Drug Use: No History; Caffeine Use: Daily - coffee; Financial Concerns: No; Food, Clothing or Shelter Needs: No; Support System Lacking: No; Transportation Concerns: No Electronic Signature(s) Signed: 05/20/2021 3:58:30 PM By: Kalman Shan DO Signed:  05/20/2021 5:19:06 PM By: Lorrin Jackson Entered By: Kalman Shan on 05/20/2021 15:54:22 -------------------------------------------------------------------------------- SuperBill Details [name] 05/20/2021 Medical Record Number: [id number] Patient Account Number: [id number] Date of Birth/Sex: Treating RN: 1941-11-02 (80 y.o. 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

## 2021-05-20 NOTE — Telephone Encounter (Signed)
Pt calling to get in touch with Dr. Romona Curls nurse. The best call back number is [phone].

## 2021-05-21 DIAGNOSIS — G5602 Carpal tunnel syndrome, left upper limb: Secondary | ICD-10-CM | POA: Diagnosis not present

## 2021-05-21 DIAGNOSIS — M1711 Unilateral primary osteoarthritis, right knee: Secondary | ICD-10-CM | POA: Diagnosis not present

## 2021-05-21 NOTE — Telephone Encounter (Signed)
See previous message

## 2021-05-21 NOTE — Telephone Encounter (Signed)
Scheduled for 7/19 at 1430 with driver.

## 2021-05-21 NOTE — Telephone Encounter (Signed)
Bilateral L4 TF on 01/30/2021. Patient reports that the symptoms are the same and he had good relief from the last injection.

## 2021-05-27 ENCOUNTER — Telehealth: Payer: Self-pay

## 2021-05-27 NOTE — Telephone Encounter (Signed)
Pt called and would like a CB from [place] regarding his appt tomorrow.

## 2021-05-27 NOTE — Telephone Encounter (Signed)
Pt would like to speak with courtney regarding his appt. The best call back number is [phone].

## 2021-05-27 NOTE — Telephone Encounter (Signed)
Called patient and answered his questions regarding his appointment time tomorrow.

## 2021-05-28 ENCOUNTER — Ambulatory Visit: Payer: Medicare Other | Admitting: Physical Medicine and Rehabilitation

## 2021-05-28 ENCOUNTER — Encounter (HOSPITAL_BASED_OUTPATIENT_CLINIC_OR_DEPARTMENT_OTHER): Payer: Medicare Other | Admitting: Internal Medicine

## 2021-05-29 ENCOUNTER — Encounter (HOSPITAL_BASED_OUTPATIENT_CLINIC_OR_DEPARTMENT_OTHER): Payer: Medicare Other | Admitting: Internal Medicine

## 2021-05-30 ENCOUNTER — Other Ambulatory Visit: Payer: Self-pay

## 2021-05-30 ENCOUNTER — Encounter (HOSPITAL_BASED_OUTPATIENT_CLINIC_OR_DEPARTMENT_OTHER): Payer: Medicare Other | Admitting: Internal Medicine

## 2021-05-30 DIAGNOSIS — Z833 Family history of diabetes mellitus: Secondary | ICD-10-CM | POA: Diagnosis not present

## 2021-05-30 DIAGNOSIS — L97518 Non-pressure chronic ulcer of other part of right foot with other specified severity: Secondary | ICD-10-CM | POA: Diagnosis not present

## 2021-05-30 DIAGNOSIS — M86179 Other acute osteomyelitis, unspecified ankle and foot: Secondary | ICD-10-CM | POA: Diagnosis not present

## 2021-05-30 DIAGNOSIS — L97519 Non-pressure chronic ulcer of other part of right foot with unspecified severity: Secondary | ICD-10-CM | POA: Diagnosis not present

## 2021-05-30 NOTE — Progress Notes (Signed)
MASAMI, PLATA ([id number]) Visit Report for 05/30/2021 Arrival Information Details Patient Name: [name]: TECUMSEH, YEAGLEY [name] 05/30/2021 1:45 PM Medical Record Number: [id number] Patient Account Number: [id number] Date of Birth/Sex: Treating RN: 01-Oct-1941 (80 y.o. Ernestene Mention Primary Care Tye Juarez: [name] Other Clinician: Referring Khaliq Turay: Treating Jheri Mitter/Extender: [name] Weeks in Treatment: 16 Visit Information History Since Last Visit Added or deleted any medications: No Patient Arrived: [name] Any new allergies or adverse reactions: No Arrival Time: 14:24 Had a fall or experienced change in No Accompanied By: self activities of daily living that may affect Transfer Assistance: None risk of falls: Patient Identification Verified: Yes Signs or symptoms of abuse/neglect since last visito No Secondary Verification Process Completed: Yes Hospitalized since last visit: No Patient Requires Transmission-Based Precautions: No Implantable device outside of the clinic excluding No Patient Has Alerts: Yes cellular tissue based products placed in the center Patient Alerts: R ABI non compressible since last visit: Has Dressing in Place as Prescribed: Yes Pain Present Now: No Electronic Signature(s) Signed: 05/30/2021 6:07:52 PM By: Baruch Gouty RN, BSN Entered By: Baruch Gouty on 05/30/2021 14:28:37 -------------------------------------------------------------------------------- Clinic Level of Care Assessment Details [name] 05/30/2021 1:45 PM Medical Record Number: [id number] Patient Account Number: [id number] Date of Birth/Sex: Treating RN: 04-Jul-1941 (80 y.o. [name], [name] Primary Care Tekelia Kareem: [name] Other Clinician: Referring Jaskirat Schwieger: Treating Vicy Medico/Extender: [name] Weeks in Treatment: 16 Clinic Level of Care Assessment Items TOOL 4  Quantity Score X- 1 0 Use when only an EandM is performed on FOLLOW-UP visit ASSESSMENTS - Nursing Assessment / Reassessment X- 1 10 Reassessment of Co-morbidities (includes updates in patient status) X- 1 5 Reassessment of Adherence to Treatment Plan ASSESSMENTS - Wound and Skin A ssessment / Reassessment X - Simple Wound Assessment / Reassessment - one wound 1 5 []  - 0 Complex Wound Assessment / Reassessment - multiple wounds X- 1 10 Dermatologic / Skin Assessment (not related to wound area) ASSESSMENTS - Focused Assessment X- 1 5 Circumferential Edema Measurements - multi extremities X- 1 10 Nutritional Assessment / Counseling / Intervention []  - 0 Lower Extremity Assessment (monofilament, tuning fork, pulses) []  - 0 Peripheral Arterial Disease Assessment (using hand held doppler) ASSESSMENTS - Ostomy and/or Continence Assessment and Care []  - 0 Incontinence Assessment and Management []  - 0 Ostomy Care Assessment and Management (repouching, etc.) PROCESS - Coordination of Care X - Simple Patient / Family Education for ongoing care 1 15 []  - 0 Complex (extensive) Patient / Family Education for ongoing care X- 1 10 Staff obtains Programmer, systems, Records, T Results / Process Orders est []  - 0 Staff telephones HHA, Nursing Homes / Clarify orders / etc []  - 0 Routine Transfer to another Facility (non-emergent condition) []  - 0 Routine Hospital Admission (non-emergent condition) []  - 0 New Admissions / Biomedical engineer / Ordering NPWT Apligraf, etc. , []  - 0 Emergency Hospital Admission (emergent condition) X- 1 10 Simple Discharge Coordination []  - 0 Complex (extensive) Discharge Coordination PROCESS - Special Needs []  - 0 Pediatric / Minor Patient Management []  - 0 Isolation Patient Management []  - 0 Hearing / Language / Visual special needs []  - 0 Assessment of Community assistance (transportation, D/C planning, etc.) []  - 0 Additional assistance / Altered  mentation []  - 0 Support Surface(s) Assessment (bed, cushion, seat, etc.) INTERVENTIONS - Wound Cleansing / Measurement X - Simple Wound Cleansing - one wound 1  5 []  - 0 Complex Wound Cleansing - multiple wounds X- 1 5 Wound Imaging (photographs - any number of wounds) []  - 0 Wound Tracing (instead of photographs) X- 1 5 Simple Wound Measurement - one wound []  - 0 Complex Wound Measurement - multiple wounds INTERVENTIONS - Wound Dressings []  - 0 Small Wound Dressing one or multiple wounds []  - 0 Medium Wound Dressing one or multiple wounds []  - 0 Large Wound Dressing one or multiple wounds []  - 0 Application of Medications - topical []  - 0 Application of Medications - injection INTERVENTIONS - Miscellaneous []  - 0 External ear exam []  - 0 Specimen Collection (cultures, biopsies, blood, body fluids, etc.) []  - 0 Specimen(s) / Culture(s) sent or taken to Lab for analysis []  - 0 Patient Transfer (multiple staff / Civil Service fast streamer / Similar devices) []  - 0 Simple Staple / Suture removal (25 or less) []  - 0 Complex Staple / Suture removal (26 or more) []  - 0 Hypo / Hyperglycemic Management (close monitor of Blood Glucose) []  - 0 Ankle / Brachial Index (ABI) - do not check if billed separately X- 1 5 Vital Signs Has the patient been seen at the hospital within the last three years: Yes Total Score: 100 Level Of Care: New/Established - Level 3 Electronic Signature(s) Signed: 05/30/2021 6:31:47 PM By: Deon Pilling Entered By: Deon Pilling on 05/30/2021 14:42:46 -------------------------------------------------------------------------------- Encounter Discharge Information Details [name] 05/30/2021 1:45 PM Medical Record Number: [id number] Patient Account Number: [id number] Date of Birth/Sex: Treating RN: 04-27-1941 (80 y.o. [name] Primary Care Ionia Schey: [name] Other Clinician: Referring Koraline Phillipson: Treating  Bradie Sangiovanni/Extender: [name] Weeks in Treatment: 16 Encounter Discharge Information Items Discharge Condition: Stable Ambulatory Status: Cane Discharge Destination: Home Transportation: Private Auto Accompanied By: self Schedule Follow-up Appointment: No Clinical Summary of Care: Electronic Signature(s) Signed: 05/30/2021 6:31:47 PM By: Deon Pilling Entered By: Deon Pilling on 05/30/2021 14:43:11 -------------------------------------------------------------------------------- Lower Extremity Assessment Details Patient Name: [name]: KASEY, EWINGS [name] 05/30/2021 1:45 PM Medical Record Number: [id number] Patient Account Number: [id number] Date of Birth/Sex: Treating RN: August 28, 1941 (80 y.o. Ernestene Mention Primary Care Katherina Wimer: [name] Other Clinician: Referring Quamir Willemsen: Treating Tamer Baughman/Extender: [name] Weeks in Treatment: 16 Edema Assessment Assessed: [name]: No] [name]: No] Edema: [Left: Ye] [Right: s] Calf Left: Right: Point of Measurement: 33 cm From Medial Instep 39.5 cm Ankle Left: Right: Point of Measurement: 10 cm From Medial Instep 24.3 cm Vascular Assessment Pulses: Dorsalis Pedis Palpable: [Right:Yes] Electronic Signature(s) Signed: 05/30/2021 6:07:52 PM By: Baruch Gouty RN, BSN Entered By: Baruch Gouty on 05/30/2021 14:30:14 -------------------------------------------------------------------------------- Multi Wound Chart Details [name] 05/30/2021 1:45 PM Medical Record Number: [id number] Patient Account Number: [id number] Date of Birth/Sex: Treating RN: 1941-02-06 (80 y.o. 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] [N/A:N/A] Primary Etiology: [1:Cataracts, Chronic sinus] [N/A:N/A] Comorbid History: [1:problems/congestion, Asthma, Sleep Apnea, Hypertension, Peripheral Venous Disease, Osteoarthritis 01/10/2020] [N/A:N/A] Date Acquired: [1:16] [N/A:N/A] Weeks of Treatment: [1:Open] [N/A:N/A] Wound Status: [1:0x0x0] [N/A:N/A] Measurements L x W x D (cm) [1:0] [N/A:N/A] A (cm) :  rea [1:0] [N/A:N/A] Volume (cm) : [1:100.00%] [N/A:N/A] % Reduction in Area: [1:100.00%] [N/A:N/A] % Reduction in Volume: [1:Full Thickness With Exposed Support] [N/A:N/A] Classification: [1:Structures None Present] [N/A:N/A] Exudate Amount: [1:None Present (0%)] [N/A:N/A] Granulation Amount: [1:None Present (0%)] [N/A:N/A] Necrotic Amount: [1:Fascia: No] [N/A:N/A] Exposed Structures: [1:Fat Layer (Subcutaneous Tissue): No Tendon: No Muscle: No Joint: No Bone: No Large (67-100%)] [N/A:N/A] Treatment Notes Electronic Signature(s) Signed: 05/30/2021 5:24:41 PM By: Linton Ham MD Signed: 05/30/2021 6:31:47 PM By: Deon Pilling Entered By: Linton Ham on 05/30/2021 15:10:54 -------------------------------------------------------------------------------- Multi-Disciplinary Care Plan Details [name] 05/30/2021 1:45 PM Medical Record Number: [id number] Patient Account Number: [id number] Date of Birth/Sex: Treating RN: 14-Dec-1940 (80 y.o. [name] Primary Care Ferd Horrigan: [name] Other Clinician: Referring Knoxx Boeding: Treating Cruz Devilla/Extender: [name] Weeks in Treatment: 16 Active Inactive Electronic Signature(s) Signed: 05/30/2021 6:31:47 PM By: Deon Pilling Entered By: Deon Pilling on 05/30/2021  14:42:02 -------------------------------------------------------------------------------- Pain Assessment Details [name] 05/30/2021 1:45 PM Medical Record Number: [id number] Patient Account Number: [id number] Date of Birth/Sex: Treating RN: 1941/03/08 (80 y.o. Ernestene Mention Primary Care Lynne Takemoto: [name] Other Clinician: Referring Currie Dennin: Treating Juanda Luba/Extender: [name] Weeks in Treatment: 16 Active Problems Location of Pain Severity and Description of Pain Patient Has Paino No Site Locations Rate the pain. Current Pain Level: 0 Pain Management and Medication Current Pain Management: Electronic Signature(s) Signed: 05/30/2021 6:07:52 PM By: Baruch Gouty RN, BSN Entered By: Baruch Gouty on 05/30/2021 14:29:19 -------------------------------------------------------------------------------- Patient/Caregiver Education Details Patient Name: [name]: [name] RD Viona Gilmore 7/21/2022andnbsp1:45 PM Medical Record Number: [id number] Patient Account Number: [id number] Date of Birth/Gender: Treating RN: 1940-12-13 (80 y.o. [name] Primary Care Physician: [name] Other Clinician: Referring Physician: Treating Physician/Extender: Leonard Downing in Treatment: 16 Education Assessment Education Provided To: Patient Education Topics Provided Wound/Skin Impairment: Handouts: Skin Care Do's and Dont's Methods: Explain/Verbal Responses: Reinforcements needed Electronic Signature(s) Signed: 05/30/2021 6:31:47 PM By: Deon Pilling Entered By: Deon Pilling on 05/30/2021 14:42:14 -------------------------------------------------------------------------------- Wound Assessment Details [name] 05/30/2021 1:45 PM Medical Record Number: [id number] Patient Account Number: [id number] Date of Birth/Sex: Treating  RN: February 26, 1941 (80 y.o. 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 T Great oe Wound Open Wounding Event: Other Lesion Status: Date Acquired: 01/10/2020 Comorbid Cataracts, Chronic sinus problems/congestion, Asthma, Sleep Weeks Of Treatment: 16 History: Apnea, Hypertension, Peripheral Venous Disease, Osteoarthritis Clustered Wound: No Wound Measurements Length: (cm) Width: (cm) Depth: (cm) Area: (cm) Volume: (cm) 0 % Reduction in Area: 100% 0 % Reduction in Volume: 100% 0 Epithelialization: Large (67-100%) 0 Tunneling: No 0 Undermining: No Wound Description Classification: Full Thickness With Exposed Support Structures Exudate Amount: None Present Foul Odor After Cleansing: No Slough/Fibrino No Wound Bed Granulation Amount: None Present (0%) Exposed Structure Necrotic Amount: None Present (0%) Fascia Exposed: No Fat Layer (Subcutaneous Tissue) Exposed: No Tendon Exposed: No Muscle Exposed: No Joint Exposed: No Bone Exposed: No Electronic Signature(s) Signed: 05/30/2021 6:07:52 PM By: Baruch Gouty RN, BSN Entered By: Baruch Gouty on 05/30/2021 14:30:44 -------------------------------------------------------------------------------- [place] Details [name] 05/30/2021 1:45 PM Medical Record Number: [id number] Patient Account Number: [id number] Date of Birth/Sex:  Treating RN: 1941-08-20 (80 y.o. Ernestene Mention Primary Care Bryanah Sidell: [name] Other Clinician: Referring Rhiana Morash: Treating Akhilesh Sassone/Extender: [name] Weeks in Treatment: 16 Vital Signs Time Taken: 14:28 Temperature (F): 98.5 Height (in): 69 Pulse (bpm): 89 Source:  Stated Respiratory Rate (breaths/min): 18 Weight (lbs): 250 Blood Pressure (mmHg): 154/98 Source: Stated Reference Range: 80 - 120 mg / dl Body Mass Index (BMI): 36.9 Electronic Signature(s) Signed: 05/30/2021 6:07:52 PM By: Baruch Gouty RN, BSN Entered By: Baruch Gouty on 05/30/2021 14:29:06

## 2021-05-30 NOTE — Progress Notes (Signed)
[name], [name] ([id number]) Visit Report for 05/30/2021 HPI Details Patient Name: [name]: [name], [name] [name] 05/30/2021 1:45 PM Medical Record Number: [id number] Patient Account Number: [id number] Date of Birth/Sex: Treating RN: 1941-08-19 (80 y.o. [name] Primary Care Provider: [name] Other Clinician: Referring Provider: Treating Provider/Extender: [name] Weeks in Treatment: 16 History of Present Illness HPI Description: ADMISSION 02/07/2021 This is a 80 year old man who is not a diabetic "prediabetic" who comes in today letter asking [place] to look at an area on his right first toe tip that has been there about a year. He has been getting this dressed that [employer] and he had Xeroform on this with gauze. The patient is not exactly sure how this came about but he is not able to dress the wound himself because of limitations due to what sounds like spinal stenosis and pain. He had a wound on the ankle as well but that healed over. He was seen by vascular in May 2021. This was related to a right great toe wound. He had an angiogram that was completely normal on both sides good perfusion right into the feet. He also looks like he has lymphedema and he has compression stockings but he cannot get them on and off so he essentially leaves he is on even when he showering Past medical history includes prediabetes, low back pain secondary I think the lumbar spinal stenosis has been offered a decompressive laminectomy but he has not gone forth with this. 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 ABI was noncompressible in our clinic on the right right first toe. 4/7; patient I admitted the clinic last week. He has an area on the tip of his right first toe. We cleaned this off last week and have been using silver collagen. He is in the same crocs today that he wore last week and I told him not  to use. For 1 reason or another he could not handle a surgical sandal. 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 He is not a diabetic 4/12; right first toe wound chronic. We have been using silver collagen. Once again he comes in then the same pair of old crocs that he comes in every week. 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 His x-ray suggested suspicious for osteomyelitis with bone irregularity at the tuft of the right great toe 4/21; patient has been using silver collagen every other day to the wound on his right great toe. He continues to wear crocs. He has no complaints or issues today. 5/5; 2-week follow-up. 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 Mild soft tissue edema with enhancement at the distal aspect of the great toe suggestive of cellulitis. Fortunately the wound actually looks better. Has been using Hydrofera Blue 5/12; started him on Augmentin last week 875 twice daily he is tolerating this well. Last creatinine I see is 1.77 in early April. We are using Hydrofera Blue to the wound which actually looks better today. 5/17; tolerating the Augmentin well. He says the wound is actually been hurting him episodically. Actually the surface of this looks quite good. He did not get his lab work done. We are using [facility] he is changing this himself 6/2; still taking Augmentin he  should be rounding into his last week. This was empirically for underlying osteomyelitis. The area on the tip of his toe is still open still with some depth but no palpable bone. We have been using Hydrofera Blue. Surface area of the wound has not been making much improvement Is work was essentially unremarkable except for a creatinine of 1.79. Reason for his renal insufficiency is not clear he is not a diabetic does not have hypertension however he does take NSAIDs and  Lasix I wonder if this is the issue here. Note that his creatinine on 11/12/2018 was 1.45. Platelet count slightly low at 143 however his inflammatory markers were really in the normal range at 10 and 2 sedimentation rate and C-reactive protein respectively. 6/9; he apparently had another 2 weeks of Augmentin after we look that this last time. Therefore he should be coming into his last week next week. We are using polymen on the tip of the toe. He cannot get down to do the dressing so he comes in for a nurse visit. He comes in in crocs I have warned him against this although he says he is using a different shoe for most of the day he works in [employer]. 6/16; he comes back in in crocs. I have spoken him about this before. His areas on the tip of his toe this actually looks quite good we have been using polymen. He has completed his antibiotics which we empirically gave him for underlying osteomyelitis. Everything looks better to me in spite of his noncompliance with footwear recommendation 7/11; patient has been followed through nurse visits. He was last seen by provider on 6/16. He reports improvement to the wound with the use of PolyMem. He denies signs of infection. 7/21; the patient arrives with the area on the tip of his right great toe completely healed. He has the same proximal on that I see every time. I treated him empirically with Augmentin for osteomyelitis. He needs to be vigilant about this. Electronic Signature(s) Signed: 05/30/2021 5:24:41 PM By: Linton Ham MD Entered By: Linton Ham on 05/30/2021 15:13:13 -------------------------------------------------------------------------------- Physical Exam Details Patient Name: [name]: [name] [name] 05/30/2021 1:45 PM Medical Record Number: [id number] Patient Account Number: [id number] Date of Birth/Sex: Treating RN: December 17, 1940 (80 y.o. [name] Primary Care Provider: [name] Other  Clinician: Referring Provider: Treating Provider/Extender: [name] Weeks in Treatment: 16 Notes Wound exam; the area on the right great toe tip is healed. However he has nothing but skin over bone in this area it would not take much to have this reopened. There is no erythema however Electronic Signature(s) Signed: 05/30/2021 5:24:41 PM By: Linton Ham MD Entered By: Linton Ham on 05/30/2021 15:14:47 -------------------------------------------------------------------------------- Physician Orders Details Patient Name: [name]: [name] [name] 05/30/2021 1:45 PM Medical Record Number: [id number] Patient Account Number: [id number] Date of Birth/Sex: Treating RN: 12/12/1940 (80 y.o. [name] Primary Care Provider: [name] Other Clinician: Referring Provider: Treating Provider/Extender: [name] Weeks in Treatment: 16 Verbal / Phone Orders: No Diagnosis Coding Discharge From [facility] Services Discharge from [place] - call if any future wound care needs. Pad toe for protection. Edema Control - Lymphedema / SCD / Other Patient to wear own compression stockings every day. Exercise regularly Moisturize legs daily. - every night before bed. Off-Loading Other: - wear good fitting shoes. do not wear slip on Crocs. Electronic Signature(s) Signed: 05/30/2021 5:24:41  PM By: Linton Ham MD Signed: 05/30/2021 6:31:47 PM By: Deon Pilling Entered By: Deon Pilling on 05/30/2021 15:25:08 -------------------------------------------------------------------------------- Problem List Details Patient Name: [name]: [name] [name] 05/30/2021 1:45 PM Medical Record Number: [id number] Patient Account Number: [id number] Date of Birth/Sex: Treating RN: 1941/04/16 (80 y.o. 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 05/30/2021 1:45 PM Medical Record Number: [id number] Patient Account Number: [id number] Date of Birth/Sex: Treating RN: 21-Apr-1941 (80 y.o. [name] Primary Care Provider: [name] Other Clinician: Referring Provider: Treating Provider/Extender: [name] Weeks in Treatment: 16 Subjective History of Present Illness (HPI) ADMISSION 02/07/2021 This is a 80 year old man who is not a diabetic "prediabetic" who comes in today letter asking [place] to look at an area on his right first toe tip that has been there about a year. He has been getting this dressed that [employer] and he had Xeroform on this with gauze. The patient is not exactly sure how this came about but he is not able to dress the wound himself because of limitations due to what sounds like spinal stenosis and pain. He had a wound on the ankle as well but that healed over. He was seen by vascular in May 2021. This was related to a right great toe wound. He had an angiogram that was completely normal on both sides good perfusion right into the feet. He also looks like he has lymphedema and he has compression stockings but he cannot get  them on and off so he essentially leaves he is on even when he showering Past medical history includes prediabetes, low back pain secondary I think the lumbar spinal stenosis has been offered a decompressive laminectomy but he has not gone forth with this. 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 ABI was noncompressible in our clinic on the right right first toe. 4/7; patient I admitted the clinic last week. He has an area on the tip of his right first toe. We cleaned this off last week and have been using silver collagen. He is in the same crocs today that he wore last week and I told him not to use. For 1 reason or another he could not handle a surgical sandal. 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 He is not a diabetic 4/12; right first toe wound chronic. We have been using silver collagen. Once again he comes in then the same pair of old crocs that he comes in every week. 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 His x-ray suggested suspicious for osteomyelitis with bone irregularity at the tuft of the right great toe 4/21; patient has been using silver collagen every other day to the wound on his  right great toe. He continues to wear crocs. He has no complaints or issues today. 5/5; 2-week follow-up. 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 Mild soft tissue edema with enhancement at the distal aspect of the great toe suggestive of cellulitis. Fortunately the wound actually looks better. Has been using Hydrofera Blue 5/12; started him on Augmentin last week 875 twice daily he is tolerating this well. Last creatinine I see is 1.77 in early April. We are using Hydrofera Blue to the wound which actually looks better today. 5/17; tolerating the Augmentin well. He says the wound is actually been  hurting him episodically. Actually the surface of this looks quite good. He did not get his lab work done. We are using [facility] he is changing this himself 6/2; still taking Augmentin he should be rounding into his last week. This was empirically for underlying osteomyelitis. The area on the tip of his toe is still open still with some depth but no palpable bone. We have been using Hydrofera Blue. Surface area of the wound has not been making much improvement Is work was essentially unremarkable except for a creatinine of 1.79. Reason for his renal insufficiency is not clear he is not a diabetic does not have hypertension however he does take NSAIDs and Lasix I wonder if this is the issue here. Note that his creatinine on 11/12/2018 was 1.45. Platelet count slightly low at 143 however his inflammatory markers were really in the normal range at 10 and 2 sedimentation rate and C-reactive protein respectively. 6/9; he apparently had another 2 weeks of Augmentin after we look that this last time. Therefore he should be coming into his last week next week. We are using polymen on the tip of the toe. He cannot get down to do the dressing so he comes in for a nurse visit. He comes in in crocs I have warned him against this although he says he is using a different shoe for most of the day he works in [employer]. 6/16; he comes back in in crocs. I have spoken him about this before. His areas on the tip of his toe this actually looks quite good we have been using polymen. He has completed his antibiotics which we empirically gave him for underlying osteomyelitis. Everything looks better to me in spite of his noncompliance with footwear recommendation 7/11; patient has been followed through nurse visits. He was last seen by provider on 6/16. He reports improvement to the wound with the use of PolyMem. He denies signs of infection. 7/21; the patient arrives with the area on the tip of his right great toe  completely healed. He has the same proximal on that I see every time. I treated him empirically with Augmentin for osteomyelitis. He needs to be vigilant about this. Objective Constitutional Vitals Time Taken: 2:28 PM, Height: 69 in, Source: Stated, Weight: 250 lbs, Source: Stated, BMI: 36.9, Temperature: 98.5 F, Pulse: 89 bpm, Respiratory Rate: 18 breaths/min, Blood Pressure: 154/98 mmHg. Integumentary (Hair, Skin) Wound #1 status is Open. Original cause of wound was Other Lesion. The date acquired was: 01/10/2020. The wound has been in treatment 16 weeks. The wound is located on the Right T Great. The wound measures 0cm length x 0cm width x 0cm depth; 0cm^2 area and 0cm^3 volume. There is no tunneling or oe undermining noted. There is a none present amount of drainage noted. There is no granulation within the wound  bed. There is no necrotic tissue within the wound bed. 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 Pad toe for protection. Edema Control - Lymphedema / SCD / Other: Patient to wear own compression stockings every day. Exercise regularly Moisturize legs daily. - every night before bed. Off-Loading: Other: - wear good fitting shoes. do not wear slip on Crocs. 1. The right great toe tip is healed. Therefore the patient can be discharged 2. I have advised him to keep this area padded in his shoes. Once again we have the croc discussion he claims that the toe does not interact with the tip of the shoe 3. We treated him empirically for osteomyelitis he needs to be vigilant about reopening in this area perhaps for the next 10 to 12 months 4. He cannot get down to his lower legs to change compression stockings. He lives is alone. He has a  stocking on the left leg although it is all frayed on the bottom of his foot. I am doubt we can really count on him to be reliable in these areas. Electronic Signature(s) Signed: 05/30/2021 5:24:41 PM By: Linton Ham MD Signed: 05/30/2021 6:31:47 PM By: Deon Pilling Entered By: Deon Pilling on 05/30/2021 15:25:27 -------------------------------------------------------------------------------- SuperBill Details Patient Name: [name]: [name] [name] 05/30/2021 Medical Record Number: [id number] Patient Account Number: [id number] Date of Birth/Sex: Treating RN: [birth year] (80 y.o. 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

## 2021-06-03 ENCOUNTER — Ambulatory Visit: Payer: Medicare Other | Admitting: Pulmonary Disease

## 2021-06-04 ENCOUNTER — Ambulatory Visit (INDEPENDENT_AMBULATORY_CARE_PROVIDER_SITE_OTHER): Payer: Medicare Other | Admitting: Pulmonary Disease

## 2021-06-04 ENCOUNTER — Other Ambulatory Visit: Payer: Self-pay

## 2021-06-04 ENCOUNTER — Encounter: Payer: Self-pay | Admitting: Pulmonary Disease

## 2021-06-04 VITALS — BP 122/72 | HR 74 | Temp 97.8°F | Ht 69.0 in | Wt 255.4 lb

## 2021-06-04 DIAGNOSIS — R0609 Other forms of dyspnea: Secondary | ICD-10-CM

## 2021-06-04 DIAGNOSIS — J4520 Mild intermittent asthma, uncomplicated: Secondary | ICD-10-CM

## 2021-06-04 DIAGNOSIS — R0600 Dyspnea, unspecified: Secondary | ICD-10-CM

## 2021-06-04 NOTE — Patient Instructions (Signed)
Continue Symbicort Schedule high-res CT for further evaluation  Follow-up in 4 months

## 2021-06-04 NOTE — Progress Notes (Signed)
[name] THIARA    [id number]    05/04/1941  Primary Care Physician:McNeill, Abigail Butts, MD  Referring Physician: Cari Caraway, MD [place],  [place] [address]  Chief complaint: Follow up for dyspnea  HPI: 80 year old with history of sleep apnea, allergic rhinitis, hyperlipidemia Referred for evaluation of dyspnea.  Symptoms have been going on for the past 2 years.  He has dyspnea on exertion and sometimes at rest.  Associated with wheeze, chest tightness.  Currently on albuterol inhaler.  Previously had tried Advair but cannot tell if it made a difference.  He stopped using it as it was too expensive.  Has seasonal allergies, significant issues with GERD and is on Nexium.  History also noted for colonic polyps.  Previously followed by Dr. Benson Norway Reports seeing blood in the stool recently and intermittent right upper quadrant pain.  Follows with Dr. Gwenlyn Found, cardiology with normal cardiac catheterization in 2008.  Echocardiogram and stress test in 2018 within normal limits.  Symptoms not felt to be related to the heart.  Has bilateral lower extremity edema which is attributed to venous insufficiency.  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  Reports significant basement dampness and flooding at home with mold issues.  No hot tub, Jacuzzi, down pillows, comforters Smoking history: Never smoker Travel history: No significant travel history Relevant family history: No significant family history of lung disease  Interim history: Continues on Symbicort Continues to have significant dyspnea on exertion   He did not end up getting the low back surgery for spinal stenosis due to concern for his overall health status and ability to withstand surgery.  Outpatient Encounter Medications as of 06/04/2021  Medication Sig   Albuterol Sulfate (PROAIR RESPICLICK) [id number] (90 Base) MCG/ACT AEPB Inhale  2 puffs into the lungs every 6 (six) hours as needed.   B-D 3CC LUER-LOK SYR 21GX1-1/2 21G X 1-1/2" 3 ML MISC USE TO INJECT DEPOTESTOSTERONE.   budesonide-formoterol (SYMBICORT) 160-4.5 MCG/ACT inhaler Inhale 2 puffs into the lungs 2 (two) times daily.   buPROPion (WELLBUTRIN XL) 300 MG 24 hr tablet TAKE 1 TABLET BY MOUTH DAILY *NEEDS OFFICE VISIT (Patient taking differently: Take 300 mg by mouth daily.)   calcium carbonate 200 MG capsule Take 200 mg by mouth daily.    cetirizine (ZYRTEC) 10 MG tablet Take 10 mg by mouth daily.   citalopram (CELEXA) 10 MG tablet Take by mouth.   doxazosin (CARDURA) 8 MG tablet TAKE 1 TABLET BY MOUTH DAILY  . Take at night (Patient taking differently: Take 8 mg by mouth at bedtime.)   esomeprazole (NEXIUM) 40 MG capsule TAKE ONE CAPSULE BY MOUTH TWICE A DAY BEFORE MEALS   etodolac (LODINE) 400 MG tablet Take 1 tablet (400 mg total) by mouth 2 (two) times daily as needed.   flurazepam (DALMANE) 15 MG capsule Take by mouth.   fluticasone (FLONASE) 50 MCG/ACT nasal spray Place 1 spray into both nostrils daily.   furosemide (LASIX) 20 MG tablet TAKE 2 TABLETS BY MOUTH ONCE A DAY (Patient taking differently: Take 20 mg by mouth daily.)   gabapentin (NEURONTIN) 300 MG capsule TAKE 1 TO 2 CAPSULES BY MOUTH AT BEDTIME (Patient taking differently: Take 600 mg by mouth at bedtime.)   Glucosamine-Chondroit-Vit C-Mn (GLUCOSAMINE-CHONDROITIN) CAPS    glucosamine-chondroitin 500-400 MG tablet Take 1 tablet by mouth 2 (two) times daily.   guaiFENesin (MUCINEX) 600  MG 12 hr tablet Take 1 tablet (600 mg total) by mouth 2 (two) times daily as needed.   HYDROcodone-acetaminophen (NORCO) 5-325 MG tablet 1 po q d prn pain (Patient taking differently: Take 1 tablet by mouth daily. Additional taking in the afternoon as needed)   ipratropium (ATROVENT HFA) 17 MCG/ACT inhaler Inhale into the lungs.   LORazepam (ATIVAN) 1 MG tablet Take 0.5-1 mg by mouth at bedtime.   meloxicam (MOBIC) 15  MG tablet TAKE 1/2 TO 1 TABLET BY MOUTH DAILY AS NEEDED FOR PAIN   methocarbamol (ROBAXIN) 500 MG tablet Take 1 tablet (500 mg total) by mouth every 6 (six) hours as needed for muscle spasms. (Patient taking differently: Take 500 mg by mouth in the morning and at bedtime.)   mirtazapine (REMERON) 15 MG tablet 1 tablet at bedtime.   Multiple Vitamin (MULTIVITAMIN) tablet Take 1 tablet by mouth daily.   Multiple Vitamins-Minerals (CENTRUM SILVER) tablet See admin instructions.   NEEDLE, DISP, 21 G 21G X 1-1/2" MISC Use to inject depotestosterone.   omeprazole (PRILOSEC) 20 MG capsule 1 capsule 30 minutes before morning meal   potassium chloride (KLOR-CON) 20 MEQ packet Take by mouth.   potassium chloride SA (K-DUR,KLOR-CON) 20 MEQ tablet Take 1 tablet (20 mEq total) by mouth 2 (two) times daily.   rosuvastatin (CRESTOR) 5 MG tablet Take 5 mg by mouth daily.   sildenafil (REVATIO) 20 MG tablet Take by mouth.   Syringe, Reusable, 3 ML MISC Use to inject depotestosterone.   testosterone cypionate (DEPOTESTOTERONE CYPIONATE) 100 MG/ML injection 1 ml   TESTOSTERONE IM Inject into the muscle once a week.   [DISCONTINUED] fluticasone furoate-vilanterol (BREO ELLIPTA) 100-25 MCG/INH AEPB Inhale 1 puff into the lungs daily.   No facility-administered encounter medications on file as of 06/04/2021.    Allergies as of 06/04/2021   (No Known Allergies)   Physical Exam: Blood pressure 122/72, pulse 74, temperature 97.8 F (36.6 C), temperature source Oral, height '5\' 9"'$  (1.753 m), weight 255 lb 6.4 oz (115.8 kg), SpO2 97 %. Gen:      No acute distress HEENT:  EOMI, sclera anicteric Neck:     No masses; no thyromegaly Lungs:    Clear to auscultation bilaterally; normal respiratory effort CV:         Regular rate and rhythm; no murmurs Abd:      + bowel sounds; soft, non-tender; no palpable masses, no distension Ext:    No edema; adequate peripheral perfusion Skin:      Warm and dry; no rash Neuro:  alert and oriented x 3 Psych: normal mood and affect   Data Reviewed: Imaging: CTA 08/13/2018 no pulmonary embolism.  Bronchial wall thickening.  Dependent atelectasis.  CT chest 08/16/2019-mild dependent atelectasis, No evidence of interstitial lung disease. I have reviewed the images personally.  CTA 10/18/2019-no pulmonary embolism or acute disease  Chest x-ray 04/05/2021-chronic interstitial and bronchitic changes I have reviewed the images personally.  PFTs  11/25/2019 FVC 3.21 [82%], FEV1 2.61 [93%], F/F 81, TLC 5.93 [87%], DLCO 21.42 [90%] Normal test.  Cardiac: Echocardiogram 02/18/2017- LVEF 0000000, grade 1 diastolic dysfunction  Nuclear stress test 02/19/2017-mild diffuse hypokinesis, EF 45%.  Low risk study with no ischemia identified.   Labs: CMP 08/05/2019-significant for creatinine of 1.64, hepatic panel within normal limits CBC FasTRACT on 7/22-WBC 6.1, eos 3%, absolute eosinophil count 183 IgE 08/05/2019-89 Hypersensitivity panel 08/05/2019-negative  BNP 04/05/2021-24  SARS-CoV-2 09/06/2019-negative  Sleep PSG 12/01/2018- severe sleep apnea with AHI 90.6 desaturations  to 76%. CPAP titration/2/20- recommend trial CPAP on 11  Assessment:  Mild asthma Suspect reactive airway disease, asthma, with contribution from deconditioning, body habitus.  He has significant mold exposure at home but no evidence of hypersensitivity pneumonitis on labs or CT scan  Continues to have dyspnea on exertion.  Cardiac work-up noted as before with normal EF and BNP Since he continues to be symptomatic with chest x-ray showing mild interstitial changes I will get a high-res CT for better evaluation Continue Symbicort He will benefit from weight loss with diet and exercise but his mobility is limited with back issues, spinal stenosis.  GERD Continue Nexium  Severe Obstructive sleep apnea Compliant with CPAP  Plan/Recommendations: - Symbicort twice daily - Encouraged to lose  weight - High-res CT Marshell Garfinkel MD [facility] 06/04/2021, 9:37 AM  CC: Cari Caraway, MD

## 2021-06-05 ENCOUNTER — Ambulatory Visit (INDEPENDENT_AMBULATORY_CARE_PROVIDER_SITE_OTHER): Payer: Medicare Other | Admitting: Physical Medicine and Rehabilitation

## 2021-06-05 ENCOUNTER — Ambulatory Visit: Payer: Self-pay

## 2021-06-05 ENCOUNTER — Encounter: Payer: Self-pay | Admitting: Physical Medicine and Rehabilitation

## 2021-06-05 VITALS — BP 147/87 | HR 79

## 2021-06-05 DIAGNOSIS — M21371 Foot drop, right foot: Secondary | ICD-10-CM

## 2021-06-05 DIAGNOSIS — M5416 Radiculopathy, lumbar region: Secondary | ICD-10-CM

## 2021-06-05 DIAGNOSIS — M48062 Spinal stenosis, lumbar region with neurogenic claudication: Secondary | ICD-10-CM

## 2021-06-05 MED ORDER — METHYLPREDNISOLONE ACETATE 80 MG/ML IJ SUSP
80.0000 mg | Freq: Once | INTRAMUSCULAR | Status: AC
  Administered 2021-06-05: 80 mg

## 2021-06-05 NOTE — Patient Instructions (Signed)

## 2021-06-05 NOTE — Progress Notes (Signed)
Pt state lower back pain that travels to his right leg. Pt state any movement makes the pain worse. Pt state he takes pain meds to help ease his pain. Pt has hx inj on 01/30/21 pt state it helped.  Numeric Pain Rating Scale and Functional Assessment Average Pain 9   In the last MONTH (on 0-10 scale) has pain interfered with the following?  1. General activity like being  able to carry out your everyday physical activities such as walking, climbing stairs, carrying groceries, or moving a chair?  Rating(10)   +Driver, -BT, -Dye Allergies.

## 2021-06-07 NOTE — Progress Notes (Signed)
[name] - 80 y.o. male MRN [id number]  Date of birth: 1940/11/28  Office Visit Note: Visit Date: 06/05/2021 PCP: Cari Caraway, MD Referred by: Cari Caraway, MD  Subjective: Chief Complaint  Patient presents with   Lower Back - Pain   Right Leg - Pain   HPI:  [name] is a 80 y.o. male who comes in today For planned bilateral L4 transforaminal epidural steroid injection.  Prior injection in March did give him some meaningful relief of his back and hip and leg pain.  His case is complicated by severe multifactorial progressive stenosis and right-sided foot drop.  He was given prescription for AFO by myself but he continues not to wear that do to the fact that he has difficulty with swelling in the legs and he does wear stockings.  He comes in today with a hip hiking and foot slapping gait on the right.  His exam really is unchanged with chronic now foot drop.  He has been seen and evaluated by Dr. Kristeen Miss at [facility].  The patient has declined surgery at this point.  He has had no new symptoms and we will provide epidural injection today to try to get him some benefit.  He has not responded well to medications but may want to consider chronic pain management in the future since he really has no surgical step forward and in particular if epidural injections are less beneficial.  He also talks today about his hand pain and numbness.  We have completed electrodiagnostic studies of the hand showing severe carpal tunnel/median nerve neuropathy.  He canceled surgery on the left hand this past year.  Dr. Meridee Score follows him for that as well.  He also endorses some pain into the neck and shoulders and in the arms.  He has had cervical MRI performed by Dr. Barbee Cough at [facility].  Cervical MRI does show moderate narrowing and stenosis.  Patient decided not to follow-up with Dr. Fletcher Anon post MRI.  ROS Otherwise per HPI.  Assessment & Plan: Visit  Diagnoses:    ICD-10-CM   1. Lumbar radiculopathy  M54.16 XR C-ARM NO REPORT    Epidural Steroid injection    methylPREDNISolone acetate (DEPO-MEDROL) injection 80 mg    2. 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  Consider referral to [facility] pain management.  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  He probably does need release with that.  He canceled that with Dr. Sharol Given and he should follow-up with him in the future.  In terms of his neck pain and shoulder pain he should follow-up with Dr. Fletcher Anon for continued evaluation and management of that.   Meds & Orders:  Meds ordered this encounter  Medications   methylPREDNISolone acetate (DEPO-MEDROL) injection 80 mg    Orders Placed This Encounter  Procedures   XR C-ARM NO REPORT   Epidural Steroid injection    Follow-up: Return if symptoms worsen or fail to improve.   Procedures: No procedures performed  Lumbosacral Transforaminal Epidural Steroid Injection - Sub-Pedicular Approach with Fluoroscopic Guidance  Patient: [name]      Date of Birth: [birth year] MRN: [id number] PCP: Cari Caraway, MD      Visit Date: 06/05/2021  Universal Protocol:    Date/Time: 06/05/2021  Consent Given By: the patient  Position: PRONE  Additional Comments: Vital signs were monitored before and after the procedure. Patient was prepped and draped in the usual sterile fashion. The correct patient, procedure, and site was verified.   Injection Procedure Details:   Procedure diagnoses: Lumbar radiculopathy [M54.16]    Meds Administered:  Meds ordered this encounter  Medications   methylPREDNISolone acetate (DEPO-MEDROL) injection 80 mg    Laterality: Bilateral  Location/Site:  L4-L5  Needle:5.0 in., 22 ga.  Short bevel or Quincke  spinal needle  Needle Placement: Transforaminal  Findings:    -Comments: Excellent flow of contrast along the nerve, nerve root and into the epidural space.  Procedure Details: After squaring off the end-plates to get a true AP view, the C-arm was positioned so that an oblique view of the foramen as noted above was visualized. The target area is just inferior to the "nose of the scotty dog" or sub pedicular. The soft tissues overlying this structure were infiltrated with 2-3 ml. of 1% Lidocaine without Epinephrine.  The spinal needle was inserted toward the target using a "trajectory" view along the fluoroscope beam.  Under AP and lateral visualization, the needle was advanced so it did not puncture dura and was located close the 6 O'Clock position of the pedical in AP tracterory. Biplanar projections were used to confirm position. Aspiration was confirmed to be negative for CSF and/or blood. A 1-2 ml. volume of Isovue-250 was injected and flow of contrast was noted at each level. Radiographs were obtained for documentation purposes.   After attaining the desired flow of contrast documented above, a 0.5 to 1.0 ml test dose of 0.25% Marcaine was injected into each respective transforaminal space.  The patient was observed for 90 seconds post injection.  After no sensory deficits were reported, and normal lower extremity motor function was noted,   the above injectate was administered so that equal amounts of the injectate were placed at each foramen (level) into the transforaminal epidural space.   Additional Comments:  The patient tolerated the procedure well Dressing: 2 x 2 sterile gauze and Band-Aid    Post-procedure details: Patient was observed during the procedure. Post-procedure instructions were reviewed.  Patient left the clinic in stable condition.    Clinical History: CT LUMBAR MYELOGRAM FINDINGS:   T10-11: No stenosis. Facet degeneration and ligamentous calcification. Solid  bridging anterior osteophytes.   T11-12: No stenosis.   T12-L1: Mild bulging of the disc. No compressive stenosis. Conus tip at this level.   L1-2: 2 mm retrolisthesis. Moderate multifactorial spinal stenosis. Disc space narrowing. Endplate osteophytes. Facet and ligamentous hypertrophy. Foraminal stenosis on both sides that could compress the L1 nerves.   L2-3: 2 mm retrolisthesis. Chronic disc degeneration with loss of disc height and vacuum phenomenon. Endplate osteophytes. Facet and ligamentous hypertrophy. Severe multifactorial spinal stenosis likely to cause neural compression. Bilateral foraminal stenosis that could compress the L2 nerves.   L3-4: 2 mm retrolisthesis. Endplate osteophytes and bulging of the disc. Facet and ligamentous hypertrophy. Severe multifactorial spinal stenosis likely to cause neural compression. Bilateral foraminal stenosis that could affect the exiting L3 nerves.   L4-5: Chronic facet arthropathy with anterolisthesis of 1 cm. Disc degeneration with disc space narrowing and vacuum phenomenon. Endplate osteophytes and bulging of the disc. Severe stenosis of the central canal and both neural foramina likely to cause neural compression.   L5-S1: Chronic disc degeneration with loss of disc height and  vacuum phenomenon. Endplate osteophytes. Facet hypertrophy and degeneration with some ligamentous calcification. Severe stenosis of the subarticular lateral recesses and neural foramina.   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 Curvature convex to the left in the thoracolumbar junction region and towards the right in the lower lumbar region. 2 mm retrolisthesis L1-2, L2-3 and L3-4. 1 cm anterolisthesis L4-5.   Severe multifactorial spinal stenosis and neural foraminal stenosis likely to cause neural compression at L2-3, L3-4, L4-5 and L5-S1. Moderate multifactorial canal stenosis at L1-2. Bilateral  foraminal stenosis at that level.     Electronically Signed   By: Nelson Chimes M.D.   On: 12/06/2019 10:12     Objective:  VS:  HT:    WT:   BMI:     BP:(!) 147/87  HR:79bpm  TEMP: ( )  RESP:  Physical Exam Vitals and nursing note reviewed.  Constitutional:      General: He is not in acute distress.    Appearance: Normal appearance. He is obese. He is not ill-appearing.  HENT:     Head: Normocephalic and atraumatic.     Right Ear: External ear normal.     Left Ear: External ear normal.     Nose: No congestion.  Eyes:     Extraocular Movements: Extraocular movements intact.  Cardiovascular:     Rate and Rhythm: Normal rate.     Pulses: Normal pulses.  Pulmonary:     Effort: Pulmonary effort is normal. No respiratory distress.  Abdominal:     General: There is no distension.     Palpations: Abdomen is soft.  Musculoskeletal:        General: No tenderness or signs of injury.     Cervical back: Neck supple.     Right lower leg: No edema.     Left lower leg: No edema.     Comments: Patient has difficulty going from sit to stand and he does ambulate with a hip hiking foot slapping on the right.  He does have profound foot drop on the right.  He is wearing stockings today for swelling.  His legs appear less swollen than usual.  He has numbness and decreased sensation in the right L5 dermatome.  He has pain and stiffness with extension and facet loading.  No pain with hip rotation.  Skin:    Findings: No erythema or rash.  Neurological:     General: No focal deficit present.     Mental Status: He is alert and oriented to person, place, and time.     Sensory: No sensory deficit.     Motor: No weakness or abnormal muscle tone.     Coordination: Coordination normal.  Psychiatric:        Mood and Affect: Mood normal.        Behavior: Behavior normal.     Imaging: No results found.

## 2021-06-07 NOTE — Procedures (Signed)
Lumbosacral Transforaminal Epidural Steroid Injection - Sub-Pedicular Approach with Fluoroscopic Guidance  Patient: [name]      Date of Birth: Jan 26, 1941 MRN: [id number] PCP: Cari Caraway, MD      Visit Date: 06/05/2021   Universal Protocol:    Date/Time: 06/05/2021  Consent Given By: the patient  Position: PRONE  Additional Comments: Vital signs were monitored before and after the procedure. Patient was prepped and draped in the usual sterile fashion. The correct patient, procedure, and site was verified.   Injection Procedure Details:   Procedure diagnoses: Lumbar radiculopathy [M54.16]    Meds Administered:  Meds ordered this encounter  Medications   methylPREDNISolone acetate (DEPO-MEDROL) injection 80 mg    Laterality: Bilateral  Location/Site:  L4-L5  Needle:5.0 in., 22 ga.  Short bevel or Quincke spinal needle  Needle Placement: Transforaminal  Findings:    -Comments: Excellent flow of contrast along the nerve, nerve root and into the epidural space.  Procedure Details: After squaring off the end-plates to get a true AP view, the C-arm was positioned so that an oblique view of the foramen as noted above was visualized. The target area is just inferior to the "nose of the scotty dog" or sub pedicular. The soft tissues overlying this structure were infiltrated with 2-3 ml. of 1% Lidocaine without Epinephrine.  The spinal needle was inserted toward the target using a "trajectory" view along the fluoroscope beam.  Under AP and lateral visualization, the needle was advanced so it did not puncture dura and was located close the 6 O'Clock position of the pedical in AP tracterory. Biplanar projections were used to confirm position. Aspiration was confirmed to be negative for CSF and/or blood. A 1-2 ml. volume of Isovue-250 was injected and flow of contrast was noted at each level. Radiographs were obtained for documentation purposes.   After attaining the  desired flow of contrast documented above, a 0.5 to 1.0 ml test dose of 0.25% Marcaine was injected into each respective transforaminal space.  The patient was observed for 90 seconds post injection.  After no sensory deficits were reported, and normal lower extremity motor function was noted,   the above injectate was administered so that equal amounts of the injectate were placed at each foramen (level) into the transforaminal epidural space.   Additional Comments:  The patient tolerated the procedure well Dressing: 2 x 2 sterile gauze and Band-Aid    Post-procedure details: Patient was observed during the procedure. Post-procedure instructions were reviewed.  Patient left the clinic in stable condition.

## 2021-06-10 DIAGNOSIS — J4520 Mild intermittent asthma, uncomplicated: Secondary | ICD-10-CM | POA: Diagnosis not present

## 2021-06-10 DIAGNOSIS — M48062 Spinal stenosis, lumbar region with neurogenic claudication: Secondary | ICD-10-CM | POA: Diagnosis not present

## 2021-06-10 DIAGNOSIS — R5383 Other fatigue: Secondary | ICD-10-CM | POA: Diagnosis not present

## 2021-06-10 DIAGNOSIS — R7303 Prediabetes: Secondary | ICD-10-CM | POA: Diagnosis not present

## 2021-06-10 DIAGNOSIS — N1832 Chronic kidney disease, stage 3b: Secondary | ICD-10-CM | POA: Diagnosis not present

## 2021-06-10 DIAGNOSIS — G4733 Obstructive sleep apnea (adult) (pediatric): Secondary | ICD-10-CM | POA: Diagnosis not present

## 2021-06-10 DIAGNOSIS — I872 Venous insufficiency (chronic) (peripheral): Secondary | ICD-10-CM | POA: Diagnosis not present

## 2021-06-10 DIAGNOSIS — G479 Sleep disorder, unspecified: Secondary | ICD-10-CM | POA: Diagnosis not present

## 2021-06-10 DIAGNOSIS — R600 Localized edema: Secondary | ICD-10-CM | POA: Diagnosis not present

## 2021-06-10 DIAGNOSIS — I10 Essential (primary) hypertension: Secondary | ICD-10-CM | POA: Diagnosis not present

## 2021-06-10 DIAGNOSIS — E782 Mixed hyperlipidemia: Secondary | ICD-10-CM | POA: Diagnosis not present

## 2021-06-10 DIAGNOSIS — F418 Other specified anxiety disorders: Secondary | ICD-10-CM | POA: Diagnosis not present

## 2021-06-10 DIAGNOSIS — R7309 Other abnormal glucose: Secondary | ICD-10-CM | POA: Diagnosis not present

## 2021-06-11 DIAGNOSIS — Z20822 Contact with and (suspected) exposure to covid-19: Secondary | ICD-10-CM | POA: Diagnosis not present

## 2021-06-24 ENCOUNTER — Inpatient Hospital Stay: Admission: RE | Admit: 2021-06-24 | Payer: Medicare Other | Source: Ambulatory Visit

## 2021-07-10 ENCOUNTER — Other Ambulatory Visit: Payer: Medicare Other

## 2021-07-24 ENCOUNTER — Other Ambulatory Visit: Payer: Self-pay

## 2021-07-24 DIAGNOSIS — K219 Gastro-esophageal reflux disease without esophagitis: Secondary | ICD-10-CM

## 2021-07-24 MED ORDER — ESOMEPRAZOLE MAGNESIUM 40 MG PO CPDR: 40.0000 mg | DELAYED_RELEASE_CAPSULE | Freq: Two times a day (BID) | ORAL | 0 refills | Status: DC

## 2021-08-06 ENCOUNTER — Telehealth: Payer: Self-pay | Admitting: Pulmonary Disease

## 2021-08-06 NOTE — Telephone Encounter (Signed)
Pt states we helped pt apply for Symbicort pt assistance. Pt states the last shipment has not arrived and pt pt is needing medication. Pt states he is about to run out. Was told to call us if there was any interruption in shipment. Please advise [phone]

## 2021-08-07 ENCOUNTER — Emergency Department (HOSPITAL_COMMUNITY): Payer: Medicare Other

## 2021-08-07 ENCOUNTER — Emergency Department (HOSPITAL_COMMUNITY)
Admission: EM | Admit: 2021-08-07 | Discharge: 2021-08-08 | Disposition: A | Discharge status: Home / Self Care | Payer: Medicare Other | Attending: Emergency Medicine | Admitting: Emergency Medicine

## 2021-08-07 ENCOUNTER — Encounter (HOSPITAL_COMMUNITY): Payer: Self-pay

## 2021-08-07 ENCOUNTER — Other Ambulatory Visit: Payer: Self-pay

## 2021-08-07 DIAGNOSIS — K4090 Unilateral inguinal hernia, without obstruction or gangrene, not specified as recurrent: Secondary | ICD-10-CM | POA: Diagnosis not present

## 2021-08-07 DIAGNOSIS — Z85828 Personal history of other malignant neoplasm of skin: Secondary | ICD-10-CM | POA: Diagnosis not present

## 2021-08-07 DIAGNOSIS — J4520 Mild intermittent asthma, uncomplicated: Secondary | ICD-10-CM | POA: Diagnosis not present

## 2021-08-07 DIAGNOSIS — G8929 Other chronic pain: Secondary | ICD-10-CM | POA: Insufficient documentation

## 2021-08-07 DIAGNOSIS — N183 Chronic kidney disease, stage 3 unspecified: Secondary | ICD-10-CM | POA: Insufficient documentation

## 2021-08-07 DIAGNOSIS — R600 Localized edema: Secondary | ICD-10-CM | POA: Diagnosis not present

## 2021-08-07 DIAGNOSIS — Z7951 Long term (current) use of inhaled steroids: Secondary | ICD-10-CM | POA: Insufficient documentation

## 2021-08-07 DIAGNOSIS — M48061 Spinal stenosis, lumbar region without neurogenic claudication: Secondary | ICD-10-CM | POA: Diagnosis not present

## 2021-08-07 DIAGNOSIS — M542 Cervicalgia: Secondary | ICD-10-CM | POA: Diagnosis not present

## 2021-08-07 DIAGNOSIS — R0602 Shortness of breath: Secondary | ICD-10-CM

## 2021-08-07 DIAGNOSIS — I129 Hypertensive chronic kidney disease with stage 1 through stage 4 chronic kidney disease, or unspecified chronic kidney disease: Secondary | ICD-10-CM | POA: Insufficient documentation

## 2021-08-07 DIAGNOSIS — R42 Dizziness and giddiness: Secondary | ICD-10-CM | POA: Insufficient documentation

## 2021-08-07 DIAGNOSIS — R110 Nausea: Secondary | ICD-10-CM | POA: Insufficient documentation

## 2021-08-07 DIAGNOSIS — R1032 Left lower quadrant pain: Secondary | ICD-10-CM | POA: Insufficient documentation

## 2021-08-07 DIAGNOSIS — Z96652 Presence of left artificial knee joint: Secondary | ICD-10-CM | POA: Diagnosis not present

## 2021-08-07 DIAGNOSIS — M4186 Other forms of scoliosis, lumbar region: Secondary | ICD-10-CM | POA: Diagnosis not present

## 2021-08-07 DIAGNOSIS — N200 Calculus of kidney: Secondary | ICD-10-CM | POA: Diagnosis not present

## 2021-08-07 LAB — CBC WITH DIFFERENTIAL/PLATELET
Abs Immature Granulocytes: 0.03 10*3/uL (ref 0.00–0.07)
Basophils Absolute: 0 10*3/uL (ref 0.0–0.1)
Basophils Relative: 0 %
Eosinophils Absolute: 0.1 10*3/uL (ref 0.0–0.5)
Eosinophils Relative: 1 %
HCT: 49.9 % (ref 39.0–52.0)
Hemoglobin: 16.5 g/dL (ref 13.0–17.0)
Immature Granulocytes: 0 %
Lymphocytes Relative: 12 %
Lymphs Abs: 0.9 10*3/uL (ref 0.7–4.0)
MCH: 30.5 pg (ref 26.0–34.0)
MCHC: 33.1 g/dL (ref 30.0–36.0)
MCV: 92.2 fL (ref 80.0–100.0)
Monocytes Absolute: 0.5 10*3/uL (ref 0.1–1.0)
Monocytes Relative: 7 %
Neutro Abs: 6.2 10*3/uL (ref 1.7–7.7)
Neutrophils Relative %: 80 %
Platelets: 200 10*3/uL (ref 150–400)
RBC: 5.41 MIL/uL (ref 4.22–5.81)
RDW: 13.2 % (ref 11.5–15.5)
WBC: 7.8 10*3/uL (ref 4.0–10.5)
nRBC: 0 % (ref 0.0–0.2)

## 2021-08-07 LAB — URINALYSIS, ROUTINE W REFLEX MICROSCOPIC
Bilirubin Urine: NEGATIVE
Glucose, UA: NEGATIVE mg/dL
Hgb urine dipstick: NEGATIVE
Ketones, ur: NEGATIVE mg/dL
Leukocytes,Ua: NEGATIVE
Nitrite: NEGATIVE
Protein, ur: NEGATIVE mg/dL
Specific Gravity, Urine: 1.014 (ref 1.005–1.030)
pH: 5 (ref 5.0–8.0)

## 2021-08-07 LAB — COMPREHENSIVE METABOLIC PANEL
ALT: 33 U/L (ref 0–44)
AST: 35 U/L (ref 15–41)
Albumin: 3.8 g/dL (ref 3.5–5.0)
Alkaline Phosphatase: 108 U/L (ref 38–126)
Anion gap: 10 (ref 5–15)
BUN: 21 mg/dL (ref 8–23)
CO2: 27 mmol/L (ref 22–32)
Calcium: 9.3 mg/dL (ref 8.9–10.3)
Chloride: 101 mmol/L (ref 98–111)
Creatinine, Ser: 1.84 mg/dL — ABNORMAL HIGH (ref 0.61–1.24)
GFR, Estimated: 37 mL/min — ABNORMAL LOW (ref >60)
Glucose, Bld: 123 mg/dL — ABNORMAL HIGH (ref 70–99)
Potassium: 3.6 mmol/L (ref 3.5–5.1)
Sodium: 138 mmol/L (ref 135–145)
Total Bilirubin: 0.9 mg/dL (ref 0.3–1.2)
Total Protein: 7.7 g/dL (ref 6.5–8.1)

## 2021-08-07 LAB — BRAIN NATRIURETIC PEPTIDE: B Natriuretic Peptide: 15.1 pg/mL (ref 0.0–100.0)

## 2021-08-07 LAB — TROPONIN I (HIGH SENSITIVITY)
Troponin I (High Sensitivity): 11 ng/L (ref <18)
Troponin I (High Sensitivity): 11 ng/L (ref <18)

## 2021-08-07 LAB — LIPASE, BLOOD: Lipase: 72 U/L — ABNORMAL HIGH (ref 11–51)

## 2021-08-07 NOTE — Telephone Encounter (Signed)
I called the pt and he stated that he was supposed to come by and pick up the application to complete for the symbicort but he is currently in the ER at [facility].  He wanted to make PM aware of this and he is hoping that they will be able to complete his scan while he is there.  Will forward to PM to make him aware.

## 2021-08-07 NOTE — Telephone Encounter (Signed)
If  we cannot get the Symbicort assistance renewed then I am okay with switching to Breo 200

## 2021-08-07 NOTE — Telephone Encounter (Signed)
I called and spoke with patient regarding message. Patient was getting patient assistance through [employer] for Symbicort, which was approved from 07/21 to 07/22 and I informed him that the company should have sent something to re-enroll. Patient stated he received nothing and no one from our office said anything about it being only 1 year. I did apologize and printed off the application for the patient. It looks like he did try Breo 100 to see if he would like it and he cannot remember whether he liked it or not and wants to know Dr. Vaughan Browner advise on which to take.  Dr. Vaughan Browner, please advise. Thanks!

## 2021-08-07 NOTE — ED Triage Notes (Addendum)
Pt stated that he's not been feeling himself. States he's been dizzy and nauseous. Pt states that he can't lift his left leg without feeling some "growth in left groin" region. Pt states that he's had dyspnea with exertion accompanied with feeling hot.   Pt also states that he takes lasix and that it took him 15 minutes to get up flight of steps from home.

## 2021-08-07 NOTE — ED Provider Notes (Signed)
Emergency Medicine Provider Triage Evaluation Note  [name] , a 80 y.o. male  was evaluated in triage.  Pt complains of intermittent left inguinal pain for the past week.  Patient states whenever he tries to lift his left leg he will feel a bulge in the area and states he feels like it is causing limited range of motion of his left hip.  He also complains of some shortness of breath however is unsure if it was related to the pain.  He feels like his abdomen is more swollen.  He states he has been taking Lasix as indicated.  He denies any vomiting.  He states that he is having regular bowel movements and still passing gas.  Review of Systems  Positive: + groin pain, abdominal distention, SOB/dyspnea on exertion Negative: - chest pain, vomiting, constipation  Physical Exam  BP (!) 158/93 (BP Location: Left Arm)   Pulse 96   Temp 99 F (37.2 C) (Oral)   Resp 20   Ht 5\' 7"  (1.702 m)   Wt 113.4 kg   SpO2 97%   BMI 39.16 kg/m  Gen:   Awake, no distress   Resp:  Normal effort  MSK:   Moves extremities without difficulty  Other:  No obvious inguinal hernia appreciated on exam with chaperone at beside. No testicular pain or swelling. Abdomen slightly distended however no obvious TTP. LCTAB.   Medical Decision Making  Medically screening exam initiated at 5:19 PM.  Appropriate orders placed.  [name] was informed that the remainder of the evaluation will be completed by another provider, this initial triage assessment does not replace that evaluation, and the importance of remaining in the ED until their evaluation is complete.  Patient with multiple complaints.  Difficult to assess whether he is complaining of inguinal pain versus possible fluid overload.  Could be a combination of both.  Will work-up for same    [name] 08/07/21 1724    Milton Ferguson, MD 08/07/21 2140

## 2021-08-08 ENCOUNTER — Emergency Department (HOSPITAL_COMMUNITY): Payer: Medicare Other

## 2021-08-08 DIAGNOSIS — R0602 Shortness of breath: Secondary | ICD-10-CM | POA: Diagnosis not present

## 2021-08-08 DIAGNOSIS — N200 Calculus of kidney: Secondary | ICD-10-CM | POA: Diagnosis not present

## 2021-08-08 DIAGNOSIS — M4186 Other forms of scoliosis, lumbar region: Secondary | ICD-10-CM | POA: Diagnosis not present

## 2021-08-08 DIAGNOSIS — K4090 Unilateral inguinal hernia, without obstruction or gangrene, not specified as recurrent: Secondary | ICD-10-CM | POA: Diagnosis not present

## 2021-08-08 DIAGNOSIS — M48061 Spinal stenosis, lumbar region without neurogenic claudication: Secondary | ICD-10-CM | POA: Diagnosis not present

## 2021-08-08 MED ORDER — ONDANSETRON 4 MG PO TBDP
4.0000 mg | ORAL_TABLET | Freq: Once | ORAL | Status: AC
  Administered 2021-08-08: 4 mg via ORAL
  Filled 2021-08-08: qty 1

## 2021-08-08 MED ORDER — METHOCARBAMOL 500 MG PO TABS: 500.0000 mg | ORAL_TABLET | Freq: Three times a day (TID) | ORAL | 0 refills | Status: DC | PRN

## 2021-08-08 MED ORDER — IOHEXOL 350 MG/ML SOLN
70.0000 mL | Freq: Once | INTRAVENOUS | Status: AC | PRN
  Administered 2021-08-08: 70 mL via INTRAVENOUS

## 2021-08-08 MED ORDER — KETOROLAC TROMETHAMINE 15 MG/ML IJ SOLN
15.0000 mg | Freq: Once | INTRAMUSCULAR | Status: AC
  Administered 2021-08-08: 15 mg via INTRAVENOUS
  Filled 2021-08-08: qty 1

## 2021-08-08 MED ORDER — METHOCARBAMOL 500 MG PO TABS
500.0000 mg | ORAL_TABLET | Freq: Once | ORAL | Status: AC
  Administered 2021-08-08: 500 mg via ORAL
  Filled 2021-08-08: qty 1

## 2021-08-08 NOTE — ED Notes (Signed)
PA called to bedside at pt request d/t groin pain starting again at time for d/c.

## 2021-08-08 NOTE — ED Provider Notes (Signed)
[facility] EMERGENCY DEPARTMENT Provider Note   CSN: [id number] Arrival date & time: 08/07/21  1454     History Chief Complaint  Patient presents with   Groin Swelling   Dizziness   Shortness of Breath    [name] is a 80 y.o. 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  Presents to the emergency department with a chief complaint of intermittent left inguinal pain over the last week and a half.  Patient reports that pain is present when he is walking and moving his left lower leg.  At rest patient has no pain.  With movement patient rates pain 9/10 on the pain scale.  Patient also complains of pain with touch.  Patient describes pain as sharp.  Patient also reports swelling to left inguinal area.  Reports that swelling gets progressively worse throughout the day as he is more active.  Swelling is more noticeable when he is walking upstairs.  Patient reports shortness of breath with exertion.  Patient reports that this has been present over the last year and has gotten progressively worse over time.  Patient denies any associated chest pain with his symptoms.  Denies any palpitations, orthopnea, PND.  Patient uses CPAP at night.  Patient reports he has leg swelling to bilateral lower extremities at baseline, right greater than left at baseline.  Patient has been taking Lasix medication as prescribed.   Dizziness Associated symptoms: nausea, shortness of breath and weakness (generalized)   Associated symptoms: no chest pain, no headaches and no vomiting   Shortness of Breath Associated symptoms: neck pain (chronic)   Associated symptoms: no abdominal pain, no chest pain, no cough, no fever, no headaches, no rash and no vomiting       Past Medical History:   Diagnosis Date   Allergy    takes Zyrtec daily   Anxiety    takes Ativan daily as needed   Arthritis    Asthma    Back pain    BPH (benign prostatic hyperplasia)    takes doxazosin for   Carpal tunnel syndrome    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   L4, L5 ,S1   Depression    takes Wellbutrin daily   Dyspnea on exertion    with exertion   GERD (gastroesophageal reflux disease)    takes Nexium and Omeprazole daily   History of kidney stones    HTN (hypertension)    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   Hyperlipidemia    Joint pain    Neuromuscular disorder (HCC)    Prediabetes    Sleep apnea    uses CPAP nightly   SOB (shortness of breath)    Swallowing difficulty    Swelling of lower extremity    right more than leg leg   Umbilical hernia     Patient Active Problem List   Diagnosis Date Noted   Allergic rhinitis due to pollen 03/25/2021   Benign prostatic hyperplasia with lower urinary tract symptoms 03/25/2021   Chronic fatigue syndrome 03/25/2021   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   Chronic kidney disease, stage 3 unspecified ([place]) 03/25/2021   Chronic pain 03/25/2021   Dyslipidemia 03/25/2021   Edema 03/25/2021   Generalized anxiety disorder 03/25/2021   Mild intermittent asthma 03/25/2021   Moderate recurrent major depression ([place]) 03/25/2021   Morbid obesity (  [place]) 03/25/2021   Non-pressure chronic ulcer of other part of right foot limited to breakdown of skin ([place]) 03/25/2021   Prediabetes 03/25/2021   Primary insomnia 03/25/2021   Sleep disturbance 03/25/2021   Paresthesia of skin 06/08/2020   Right foot drop 06/08/2020   Body mass index (BMI) 35.0-35.9, adult 03/13/2020   Spondylosis of cervical region without myelopathy or radiculopathy 01/05/2020   Elevated blood-pressure reading, without diagnosis of hypertension 12/07/2019   Spinal stenosis of lumbar region with neurogenic claudication 01/15/2017   Spondylolisthesis of lumbar region 01/15/2017    Claw toe, acquired, left 01/06/2017   Achilles tendon contracture, bilateral 01/06/2017   Pain in metatarsus of both feet 01/06/2017   Tubular adenoma of colon 12/29/2016   Basal cell carcinoma 44/31/5400   Umbilical hernia s/p lap repair with mesh 06/27/2016 06/27/2016   Degenerative arthritis of knee 03/22/2015   Dyspnea on exertion 01/31/2015   Lumbar radiculopathy 12/12/2014   Facet hypertrophy of lumbar region 12/12/2014   Left knee pain 12/12/2014   Ulcer of lower limb ([place]) 01/10/2014   Varicose veins of lower extremities with other complications 86/76/1950   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 12/21/2013   Renal insufficiency 12/21/2013   Swelling in head/neck 07/28/2012   Venous (peripheral) insufficiency 05/25/2012   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   Pain in limb 02/12/2012   Hyperlipidemia 02/28/2008   Allergic rhinitis 02/28/2008   Sleep apnea 02/28/2008    Past Surgical History:  Procedure Laterality Date   ABDOMINAL AORTOGRAM W/LOWER EXTREMITY N/A 03/14/2020   Procedure: ABDOMINAL AORTOGRAM W/LOWER EXTREMITY;  Surgeon: Serafina Mitchell, MD;  Location: [facility];  Service: Vascular;  Laterality: N/A;   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   BACK SURGERY  2003   L4, L5   BACK SURGERY     CATARACT EXTRACTION, BILATERAL     ENDOVENOUS ABLATION SAPHENOUS VEIN W/ LASER Right 06-01-2014   EVLA right greater saphenous vein by Curt Jews MD    epidural steroid injections        [facility]   hernia repair  2003   HERNIA REPAIR     hernia inguinal x 2   TOTAL KNEE ARTHROPLASTY Left 03/22/2015   Procedure: TOTAL KNEE ARTHROPLASTY;  Surgeon: Meredith Pel, MD;  Location: [facility];  Service: Orthopedics;  Laterality: Left;   UMBILICAL HERNIA REPAIR N/A 06/27/2016   Procedure: LAPAROSCOPIC ASSISTED REPAIR OF UMBILICAL HERINA WITH MESH;  Surgeon: Johnathan Hausen, MD;  Location: [facility];  Service: General;   Laterality: N/A;   VASCULAR SURGERY  2015   right leg       Family History  Problem Relation Age of Onset   Thyroid disease Mother    Heart disease Father    Hypertension Father    Diabetes Maternal Grandfather    Colon cancer Neg Hx    Esophageal cancer Neg Hx     Social History   Tobacco Use   Smoking status: Never   Smokeless tobacco: Never  Vaping Use   Vaping Use: Never used  Substance Use Topics   Alcohol use: Yes    Comment: rare   Drug use: No    Home Medications Prior to Admission medications   Medication Sig Start Date End Date Taking? Authorizing Provider  Albuterol Sulfate (PROAIR RESPICLICK) 932 (90 Base) MCG/ACT AEPB Inhale 2 puffs into the lungs every 6 (six) hours as needed. 04/05/21   Geraldo Pitter  W, NP  B-D 3CC LUER-LOK SYR 21GX1-1/2 21G X 1-1/2" 3 ML MISC USE TO INJECT DEPOTESTOSTERONE. 07/28/16   [name], MD  budesonide-formoterol (SYMBICORT) 160-4.5 MCG/ACT inhaler Inhale 2 puffs into the lungs 2 (two) times daily. 12/15/19   Lauraine Rinne, NP  buPROPion (WELLBUTRIN XL) 300 MG 24 hr tablet TAKE 1 TABLET BY MOUTH DAILY *NEEDS OFFICE VISIT Patient taking differently: Take 300 mg by mouth daily. 03/11/16   [name], MD  calcium carbonate 200 MG capsule Take 200 mg by mouth daily.     [provider]  cetirizine (ZYRTEC) 10 MG tablet Take 10 mg by mouth daily.    [provider]  citalopram (CELEXA) 10 MG tablet Take by mouth.    [provider]  doxazosin (CARDURA) 8 MG tablet TAKE 1 TABLET BY MOUTH DAILY  . Take at night Patient taking differently: Take 8 mg by mouth at bedtime. 03/11/16   [name], MD  esomeprazole (NEXIUM) 40 MG capsule Take 1 capsule (40 mg total) by mouth 2 (two) times daily before a meal. 07/24/21   Thornton Park, MD  etodolac (LODINE) 400 MG tablet Take 1 tablet (400 mg total) by mouth 2 (two) times daily as needed. 12/17/20   Hilts, Legrand Como, MD  flurazepam [facility]) 15 MG capsule Take by  mouth.    [provider]  fluticasone (FLONASE) 50 MCG/ACT nasal spray Place 1 spray into both nostrils daily. 04/05/21   Martyn Ehrich, NP  furosemide (LASIX) 20 MG tablet TAKE 2 TABLETS BY MOUTH ONCE A DAY Patient taking differently: Take 20 mg by mouth daily. 12/22/16   Jaynee Eagles, PA-C  gabapentin (NEURONTIN) 300 MG capsule TAKE 1 TO 2 CAPSULES BY MOUTH AT BEDTIME Patient taking differently: Take 600 mg by mouth at bedtime. 01/15/17   Tereasa Coop, PA-C  Glucosamine-Chondroit-Vit C-Mn (GLUCOSAMINE-CHONDROITIN) CAPS     [provider]  glucosamine-chondroitin 500-400 MG tablet Take 1 tablet by mouth 2 (two) times daily.    [provider]  guaiFENesin (MUCINEX) 600 MG 12 hr tablet Take 1 tablet (600 mg total) by mouth 2 (two) times daily as needed. 04/05/21   Martyn Ehrich, NP  HYDROcodone-acetaminophen (NORCO) 5-325 MG tablet 1 po q d prn pain Patient taking differently: Take 1 tablet by mouth daily. Additional taking in the afternoon as needed 07/06/17   Meredith Pel, MD  ipratropium (ATROVENT HFA) 17 MCG/ACT inhaler Inhale into the lungs.    [provider]  LORazepam (ATIVAN) 1 MG tablet Take 0.5-1 mg by mouth at bedtime.    [provider]  meloxicam (MOBIC) 15 MG tablet TAKE 1/2 TO 1 TABLET BY MOUTH DAILY AS NEEDED FOR PAIN 12/17/20   Hilts, Legrand Como, MD  methocarbamol (ROBAXIN) 500 MG tablet Take 1 tablet (500 mg total) by mouth every 6 (six) hours as needed for muscle spasms. Patient taking differently: Take 500 mg by mouth in the morning and at bedtime. 03/11/16   [name], MD  mirtazapine (REMERON) 15 MG tablet 1 tablet at bedtime.    [provider]  Multiple Vitamin (MULTIVITAMIN) tablet Take 1 tablet by mouth daily.    [provider]  Multiple Vitamins-Minerals (CENTRUM SILVER) tablet See admin instructions.    [provider]  NEEDLE, DISP, 21 G 21G X 1-1/2" MISC Use to inject  depotestosterone. 06/11/15   Harrison Mons, PA  omeprazole (PRILOSEC) 20 MG capsule 1 capsule 30 minutes before morning meal  [provider]  potassium chloride (KLOR-CON) 20 MEQ packet Take by mouth.    [provider]  potassium chloride SA (K-DUR,KLOR-CON) 20 MEQ tablet Take 1 tablet (20 mEq total) by mouth 2 (two) times daily. 07/23/16   [name], MD  rosuvastatin (CRESTOR) 5 MG tablet Take 5 mg by mouth daily. 12/20/18   [provider]  sildenafil (REVATIO) 20 MG tablet Take by mouth.    [provider]  Syringe, Reusable, 3 ML MISC Use to inject depotestosterone. 06/11/15   Harrison Mons, PA  testosterone cypionate (DEPOTESTOTERONE CYPIONATE) 100 MG/ML injection 1 ml    [provider]  TESTOSTERONE IM Inject into the muscle once a week.    [provider]    Allergies    Patient has no known allergies.  Review of Systems   Review of Systems  Constitutional:  Negative for chills and fever.  Eyes:  Negative for visual disturbance.  Respiratory:  Positive for shortness of breath. Negative for cough.   Cardiovascular:  Negative for chest pain.  Gastrointestinal:  Positive for nausea. Negative for abdominal pain and vomiting.  Genitourinary:  Positive for frequency (with lasix use). Negative for difficulty urinating, dysuria, flank pain, hematuria, penile discharge, penile pain, penile swelling, scrotal swelling, testicular pain and urgency.  Musculoskeletal:  Positive for back pain (chronic) and neck pain (chronic). Negative for neck stiffness.  Skin:  Negative for color change and rash.  Neurological:  Positive for weakness (generalized). Negative for dizziness, syncope, facial asymmetry, light-headedness, numbness and headaches.  Psychiatric/Behavioral:  Negative for confusion.    Physical Exam Updated Vital Signs BP (!) 150/96   Pulse 69   Temp 98.5 F (36.9 C) (Oral)   Resp 19   Ht 5\' 7"  (1.702 m)   Wt 113.4 kg    SpO2 99%   BMI 39.16 kg/m   Physical Exam Vitals and nursing note reviewed. Exam conducted with a chaperone present (Male RN present as chaperone).  Constitutional:      General: He is not in acute distress.    Appearance: He is not ill-appearing, toxic-appearing or diaphoretic.  HENT:     Head: Normocephalic.  Eyes:     General: No scleral icterus.       Right eye: No discharge.        Left eye: No discharge.     Conjunctiva/sclera: Conjunctivae normal.  Cardiovascular:     Rate and Rhythm: Normal rate.     Pulses:          Radial pulses are 2+ on the right side and 2+ on the left side.       Femoral pulses are 2+ on the right side and 2+ on the left side. Pulmonary:     Effort: Pulmonary effort is normal. No tachypnea, bradypnea or respiratory distress.     Breath sounds: Normal breath sounds. No stridor.     Comments: Patient speaks in full complete sentences without difficulty Abdominal:     General: Abdomen is protuberant. There is no distension. There are no signs of injury.     Palpations: Abdomen is soft. There is no mass or pulsatile mass.     Tenderness: There is no abdominal tenderness. There is no guarding or rebound.     Hernia: There is no hernia in the ventral area, left inguinal area or right inguinal area.  Genitourinary:    Pubic Area: No rash or pubic lice.      Penis: Uncircumcised. No  phimosis, paraphimosis, hypospadias, erythema, tenderness, discharge, swelling or lesions.      Testes: Normal. Cremasteric reflex is present.     Epididymis:     Right: Normal.     Left: Normal.     Tanner stage (genital): 5.     Comments: Tenderness to left groin.  No rash, swelling, induration, wound, erythema noted. Musculoskeletal:     Cervical back: Neck supple. No rigidity.     Right lower leg: No swelling, deformity, lacerations, tenderness or bony tenderness. 2+ Edema present.     Left lower leg: No swelling, deformity, lacerations, tenderness or bony tenderness.  1+ Edema present.  Lymphadenopathy:     Lower Body: No right inguinal adenopathy. No left inguinal adenopathy.  Skin:    General: Skin is warm and dry.  Neurological:     General: No focal deficit present.     Mental Status: He is alert.     GCS: GCS eye subscore is 4. GCS verbal subscore is 5. GCS motor subscore is 6.     Comments: No facial asymmetry, dysarthria, aphasia.  Patient able to move all limbs equally without difficulty.  Psychiatric:        Behavior: Behavior is cooperative.    ED Results / Procedures / Treatments   Labs (all labs ordered are listed, but only abnormal results are displayed) Labs Reviewed  COMPREHENSIVE METABOLIC PANEL - Abnormal; Notable for the following components:      Result Value   Glucose, Bld 123 (*)    Creatinine, Ser 1.84 (*)    GFR, Estimated 37 (*)    All other components within normal limits  LIPASE, BLOOD - Abnormal; Notable for the following components:   Lipase 72 (*)    All other components within normal limits  CBC WITH DIFFERENTIAL/PLATELET  URINALYSIS, ROUTINE W REFLEX MICROSCOPIC  BRAIN NATRIURETIC PEPTIDE  TROPONIN I (HIGH SENSITIVITY)  TROPONIN I (HIGH SENSITIVITY)    EKG EKG Interpretation  Date/Time:  Wednesday August 07 2021 17:18:13 EDT Ventricular Rate:  94 PR Interval:  180 QRS Duration: 86 QT Interval:  346 QTC Calculation: 432 R Axis:   27 Text Interpretation: Normal sinus rhythm No significant change since last tracing Confirmed by Addison Lank [phone]) on 08/08/2021 6:23:34 AM  Radiology DG Chest 2 View  Result Date: 08/07/2021 CLINICAL DATA:  Shortness of breath.  Dizzy.  Nauseous. EXAM: CHEST - 2 VIEW COMPARISON:  Chest x-ray 04/05/2021, CT chest 10/18/2019, chest x-ray 11/17/2018 FINDINGS: The heart and mediastinal contours are within normal limits. No focal consolidation. No pulmonary edema. No pleural effusion. No pneumothorax. No acute osseous abnormality. IMPRESSION: No active cardiopulmonary  disease. Electronically Signed   By: Iven Finn M.D.   On: 08/07/2021 18:34    Procedures Procedures   Medications Ordered in ED Medications  iohexol (OMNIPAQUE) 350 MG/ML injection 70 mL (70 mLs Intravenous Contrast Given 08/08/21 0706)  methocarbamol (ROBAXIN) tablet 500 mg (500 mg Oral Given 08/08/21 0914)  ondansetron (ZOFRAN-ODT) disintegrating tablet 4 mg (4 mg Oral Given 08/08/21 0914)  ketorolac (TORADOL) 15 MG/ML injection 15 mg (15 mg Intravenous Given 08/08/21 0915)    ED Course  I have reviewed the triage vital signs and the nursing notes.  Pertinent labs & imaging results that were available during my care of the patient were reviewed by me and considered in my medical decision making (see chart for details).    MDM Rules/Calculators/A&P  Alert 80 year old male no acute stress, nontoxic-appearing.  Presents to ED with chief complaint of intermittent pain and swelling to left inguinal area as well as shortness of breath.  Pain and swelling to left ankle area have been present over the last1.5 week.  Shortness of breath has been present over the last year.  Shortness of breath has gotten progressively worse over time.  Front of breath only present with exertion.  No associated chest pain with exertion.  Lab work and imaging ordered while patient was in triage.  CMP shows creatinine elevated at 1.4; patient has history of CKD this appears to slightly above patient's baseline. CBC is unremarkable Lipase elevated at 72 Urinalysis shows no signs of infection or dehydration  Chest x-ray shows no active cardiopulmonary disease EKG shows sinus rhythm with no significant change from previous tracing Troponin 11 and 11 with delta of 0 BMP within normal limits. Low suspicion for ACS or acute CHF at this time.  CT imaging shows no acute finding, negative for inguinal hernia.  Numerous bilateral renal calculi.  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  Suspect that patient's pain is likely musculoskeletal in nature.  Patient reports that he takes hydrocodone daily for chronic pain.  Patient advised to use Tylenol additionally as needed and was warned not to go over 3000 mg of Tylenol daily.  Plan to have patient follow-up with primary care provider for repeat assessment of his groin pain and shortness of breath.  We will have patient follow-up with his pulmonologist for his shortness of breath.  Prior to leaving emergency department patient complains of throbbing pain to left groin.  Patient was reevaluated no swelling or mass noted to left groin.  +2 femoral pulses intact bilaterally.  Tenderness to palpation.  Patient given Toradol, Robaxin, and Zofran.  On reassessment patient reports improvement in his pain.  Will prescribe patient with short course of Robaxin.  Discussed results, findings, treatment and follow up. Patient advised of return precautions. Patient verbalized understanding and agreed with plan.  Patient was discussed with and evaluated by Dr. Doren Custard.   Final Clinical Impression(s) / ED Diagnoses Final diagnoses:  Shortness of breath  Left inguinal pain    Rx / DC Orders ED Discharge Orders          Ordered    methocarbamol (ROBAXIN) 500 MG tablet  Every 8 hours PRN        08/08/21 0947             Loni Beckwith, PA-C 08/08/21 0957    Godfrey Pick, MD 08/08/21 1947

## 2021-08-08 NOTE — Discharge Instructions (Addendum)
You came to the emergency department today to be assessed for your shortness of breath and pain/swelling to your left groin.  Your physical exam and lab work were reassuring.  The CT scan of your abdomen and pelvis showed no signs of a hernia to your left groin.  Your chest x-ray showed no signs of pneumonia or fluid on your lungs.  Please follow-up with your primary care provider for reassessment of your shortness of breath and left groin pain.  Please follow-up with your pulmonologist for reassessment of your shortness of breath.  Today you were prescribed Methocarbamol (Robaxin).  Methocarbamol (Robaxin) is used to treat muscle spasms/pain.  It works by helping to relax the muscles.  Drowsiness, dizziness, lightheadedness, stomach upset, nausea/vomiting, or blurred vision may occur.  Do not drive, use machinery, or do anything that needs alertness or clear vision until you can do it safely.  Do not combine this medication with alcoholic beverages, marijuana, or other central nervous system depressants.     Get help right away if: Your shortness of breath gets worse. You have shortness of breath when you are resting. You feel light-headed or you faint. You have a cough that is not controlled with medicines. You cough up blood. You have pain with breathing. You have pain in your chest, arms, shoulders, or abdomen. You have a fever. You cannot walk up stairs or exercise the way that you normally do.

## 2021-08-08 NOTE — ED Notes (Signed)
Daughter called for a update [phone].

## 2021-08-08 NOTE — ED Notes (Signed)
Pt pulled to hallway.

## 2021-08-13 ENCOUNTER — Other Ambulatory Visit: Payer: Self-pay | Admitting: Urology

## 2021-08-15 DIAGNOSIS — Z23 Encounter for immunization: Secondary | ICD-10-CM | POA: Diagnosis not present

## 2021-08-15 DIAGNOSIS — M25552 Pain in left hip: Secondary | ICD-10-CM | POA: Diagnosis not present

## 2021-08-15 NOTE — Telephone Encounter (Signed)
Sent from [employer]

## 2021-08-15 NOTE — Telephone Encounter (Signed)
LMTCB  Patient assistance application placed  upfront to be mailed out.

## 2021-08-20 ENCOUNTER — Telehealth: Payer: Self-pay | Admitting: Pulmonary Disease

## 2021-08-20 DIAGNOSIS — M1612 Unilateral primary osteoarthritis, left hip: Secondary | ICD-10-CM | POA: Diagnosis not present

## 2021-08-20 DIAGNOSIS — M1711 Unilateral primary osteoarthritis, right knee: Secondary | ICD-10-CM | POA: Diagnosis not present

## 2021-08-21 MED ORDER — BUDESONIDE-FORMOTEROL FUMARATE 160-4.5 MCG/ACT IN AERO: 2.0000 | INHALATION_SPRAY | Freq: Two times a day (BID) | RESPIRATORY_TRACT | 11 refills | Status: DC

## 2021-08-21 NOTE — Telephone Encounter (Signed)
Will forward to LT to follow up on forms.

## 2021-08-22 NOTE — Telephone Encounter (Signed)
[employer] forms signed by Dr. Vaughan Browner.  Printed Symbicort prescription and completed [employer] forms faxed to [employer] [phone].

## 2021-08-27 ENCOUNTER — Other Ambulatory Visit: Payer: Self-pay | Admitting: Orthopedic Surgery

## 2021-08-27 DIAGNOSIS — M25551 Pain in right hip: Secondary | ICD-10-CM

## 2021-08-27 NOTE — Telephone Encounter (Signed)
Per 08/20/21 encounter, Patient assistance forms signed and faxed to [employer].

## 2021-08-27 NOTE — Telephone Encounter (Signed)
Called patient to see if he had been able to complete the application. But he did not answer. Left message for him to call back.

## 2021-08-27 NOTE — Telephone Encounter (Signed)
Patient is returning phone call. Patient phone number is [phone].

## 2021-08-27 NOTE — Telephone Encounter (Signed)
Called and spoke with pt and he stated that he came to the office and completed the form a week or so ago and left this form up front for the pt assistance for the symbicort.  [name] have you seen any pt assistance forms from PM?  thanks

## 2021-09-02 ENCOUNTER — Encounter: Payer: Self-pay | Admitting: Podiatry

## 2021-09-02 ENCOUNTER — Ambulatory Visit (INDEPENDENT_AMBULATORY_CARE_PROVIDER_SITE_OTHER): Payer: Medicare Other | Admitting: Podiatry

## 2021-09-02 ENCOUNTER — Other Ambulatory Visit: Payer: Self-pay

## 2021-09-02 DIAGNOSIS — B351 Tinea unguium: Secondary | ICD-10-CM | POA: Diagnosis not present

## 2021-09-02 DIAGNOSIS — M79675 Pain in left toe(s): Secondary | ICD-10-CM | POA: Diagnosis not present

## 2021-09-02 DIAGNOSIS — M79674 Pain in right toe(s): Secondary | ICD-10-CM

## 2021-09-02 DIAGNOSIS — R7303 Prediabetes: Secondary | ICD-10-CM

## 2021-09-02 DIAGNOSIS — M19072 Primary osteoarthritis, left ankle and foot: Secondary | ICD-10-CM

## 2021-09-02 DIAGNOSIS — L97511 Non-pressure chronic ulcer of other part of right foot limited to breakdown of skin: Secondary | ICD-10-CM

## 2021-09-02 MED ORDER — CEPHALEXIN 500 MG PO CAPS: 500.0000 mg | ORAL_CAPSULE | Freq: Four times a day (QID) | ORAL | 0 refills | Status: AC

## 2021-09-02 MED ORDER — MELOXICAM 15 MG PO TABS: 15.0000 mg | ORAL_TABLET | Freq: Every day | ORAL | 0 refills | Status: DC

## 2021-09-02 NOTE — Progress Notes (Signed)
  Subjective:  Patient ID: [name], male    DOB: [birth year],   MRN: [id number]  Chief Complaint  Patient presents with   Nail Problem    Nail trim needed     80 y.o. male presents for diabetic nail care and concern for a pre-ulcerative callus on his right foot. Relates he has a history of infection in the bone in that toe and wanted it evaluated. Also relates pain in the left foot  . Denies any other pedal complaints. Denies n/v/f/c.   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 CFT <3 seconds. Normal hair growth on digits. No edema.  Skin. No lacerations or abrasions bilateral feet. Xerosis noted to bilateral lower extremities. Nails 1-5 are thickened discolored and elongated with subungual debris.  0.1 x0.1 cm granular ulcer to distal tip of right hallux noticed upon debridement. Mild erythema to digit and lower extremitiy.  Musculoskeletal: MMT 5/5 bilateral lower extremities in DF, PF, Inversion and Eversion. Deceased ROM in DF of ankle joint.  Neurological: Sensation intact to light touch.   Assessment:   1. DJD (degenerative joint disease), ankle and foot,  left   2. Non-pressure chronic ulcer of other part of right foot limited to breakdown of skin ([place])   3. Prediabetes      Plan:  Patient was evaluated and treated and all questions answered. Ulcer right hallux  -Debridement as below. -Dressed with silvadene, DSD. -Keflex provided. -Discussed glucose control and proper protein-rich diet.  -Discussed if any worsening redness, pain, fever or chills to call or may need to report to the emergency room. Patient expressed understanding. Refill of meloxicam for arthritis of left foot.    Procedure: Excisional Debridement of Wound Rationale: Removal of non-viable soft tissue from the wound to promote healing.  Anesthesia: none Pre-Debridement Wound Measurements: hyperkeratotic tissue  Post-Debridement Wound Measurements: 0.1 cm x 0.1 cm x 0.1 cm  Type of Debridement: Sharp Excisional Tissue Removed: Non-viable soft tissue Depth of Debridement: subcutaneous tissue. Technique: Sharp excisional debridement to bleeding, viable wound base.  Dressing: Dry, sterile, compression dressing. Disposition: Patient tolerated procedure well. Patient to return in 2 week for follow-up.  Return in about 2 weeks (around 09/16/2021) for wound check.   Lorenda Peck, DPM

## 2021-09-03 DIAGNOSIS — Z23 Encounter for immunization: Secondary | ICD-10-CM | POA: Diagnosis not present

## 2021-09-09 NOTE — Telephone Encounter (Signed)
ATC and relay that [employer] may take up to 4-6 weeks to determine pt eligibility.

## 2021-09-10 ENCOUNTER — Telehealth: Payer: Self-pay | Admitting: Pulmonary Disease

## 2021-09-11 ENCOUNTER — Ambulatory Visit: Payer: Medicare Other | Admitting: Podiatry

## 2021-09-11 NOTE — Telephone Encounter (Signed)
Call made to [employer] patient assistance program. I was on hold for approximately 17 minutes so I had to hang up.   Call made to patient, confirmed DOB. I made patient aware I called [employer] and was placed on hold for 20 minutes. Patient was very upset and asked that we try back again.   Will hold in triage and try  back later as there are several messages in triage.

## 2021-09-12 ENCOUNTER — Other Ambulatory Visit: Payer: Medicare Other

## 2021-09-18 ENCOUNTER — Ambulatory Visit: Payer: Medicare Other | Admitting: Podiatry

## 2021-09-20 ENCOUNTER — Other Ambulatory Visit: Payer: Self-pay

## 2021-09-20 ENCOUNTER — Ambulatory Visit
Admission: RE | Admit: 2021-09-20 | Discharge: 2021-09-20 | Disposition: A | Discharge status: Home / Self Care | Payer: Medicare Other | Source: Ambulatory Visit | Attending: Orthopedic Surgery | Admitting: Orthopedic Surgery

## 2021-09-20 DIAGNOSIS — M5137 Other intervertebral disc degeneration, lumbosacral region: Secondary | ICD-10-CM | POA: Diagnosis not present

## 2021-09-20 DIAGNOSIS — M160 Bilateral primary osteoarthritis of hip: Secondary | ICD-10-CM | POA: Diagnosis not present

## 2021-09-20 DIAGNOSIS — M25551 Pain in right hip: Secondary | ICD-10-CM

## 2021-09-20 DIAGNOSIS — M5136 Other intervertebral disc degeneration, lumbar region: Secondary | ICD-10-CM | POA: Diagnosis not present

## 2021-09-20 DIAGNOSIS — M48061 Spinal stenosis, lumbar region without neurogenic claudication: Secondary | ICD-10-CM | POA: Diagnosis not present

## 2021-09-26 DIAGNOSIS — S40012A Contusion of left shoulder, initial encounter: Secondary | ICD-10-CM | POA: Diagnosis not present

## 2021-09-26 DIAGNOSIS — M67912 Unspecified disorder of synovium and tendon, left shoulder: Secondary | ICD-10-CM | POA: Diagnosis not present

## 2021-09-26 DIAGNOSIS — G5602 Carpal tunnel syndrome, left upper limb: Secondary | ICD-10-CM | POA: Diagnosis not present

## 2021-09-26 NOTE — Telephone Encounter (Signed)
Patient checking on patient assistance forms for Symbicort inhaler. Patient assistance thru [employer]. Patient phone number is [phone].

## 2021-09-26 NOTE — Telephone Encounter (Signed)
I have attempted to call [employer] and have been on hold for over 20 minutes.  Will try and reach back out later today.

## 2021-09-27 NOTE — Telephone Encounter (Signed)
Patient checking on Symbicort inhaler. Patient phone number is [phone].

## 2021-09-27 NOTE — Telephone Encounter (Signed)
Called patient but he did not answer. Left message for him to call us back.  

## 2021-09-27 NOTE — Telephone Encounter (Signed)
Patient called upset because he has been out of medication for a month. I contacted [employer] and was told by Caryl Pina the application was received with the prescription and she will have it escalated to be processed. Caryl Pina mentioned to call back by Wednesday if we do not hear anything. [employer] will send a letter out to patient with approval or denial.  In the meantime- patient is having breathing issues because he has been without medication. Patient cannot afford to pay for the Symbicort and we do not have samples or coupons. Is there anything the patient can use in the meantime until his assistance kicks in?  Please advise.

## 2021-09-27 NOTE — Telephone Encounter (Signed)
Dr.Mannam, would you be ok with [place] providing him with a sample of Breztri to cover him until he can get the Symbicort approved from [employer]?

## 2021-09-27 NOTE — Telephone Encounter (Signed)
Ok to give samples of [employer]

## 2021-09-27 NOTE — Telephone Encounter (Signed)
Called patient to inform him that [employer] is working on his paperwork, which we should have an update by Wednesday next week. Patient wanted to know when he will get medication at his house- informed him it depended on when and if the renewal program of Symbicort is approved. Patient verbalized understanding and appreciated the follow up.  Can we give patient anything in the meantime?

## 2021-09-27 NOTE — Telephone Encounter (Signed)
I have called [place] [employer] and they are currently closed for a meeting or training.     I have called the pt and LM on VM to make him aware that we have been calling for the last couple of days and have not been able to get through.

## 2021-09-30 ENCOUNTER — Encounter: Payer: Self-pay | Admitting: Podiatry

## 2021-09-30 ENCOUNTER — Ambulatory Visit (INDEPENDENT_AMBULATORY_CARE_PROVIDER_SITE_OTHER): Payer: Medicare Other

## 2021-09-30 ENCOUNTER — Other Ambulatory Visit: Payer: Self-pay

## 2021-09-30 ENCOUNTER — Ambulatory Visit (INDEPENDENT_AMBULATORY_CARE_PROVIDER_SITE_OTHER): Payer: Medicare Other | Admitting: Podiatry

## 2021-09-30 DIAGNOSIS — L97511 Non-pressure chronic ulcer of other part of right foot limited to breakdown of skin: Secondary | ICD-10-CM | POA: Diagnosis not present

## 2021-09-30 DIAGNOSIS — R7303 Prediabetes: Secondary | ICD-10-CM | POA: Diagnosis not present

## 2021-09-30 DIAGNOSIS — I10 Essential (primary) hypertension: Secondary | ICD-10-CM | POA: Insufficient documentation

## 2021-09-30 MED ORDER — CEPHALEXIN 500 MG PO CAPS: 500.0000 mg | ORAL_CAPSULE | Freq: Four times a day (QID) | ORAL | 0 refills | Status: AC

## 2021-09-30 MED ORDER — BREZTRI AEROSPHERE 160-9-4.8 MCG/ACT IN AERO: 2.0000 | INHALATION_SPRAY | Freq: Two times a day (BID) | RESPIRATORY_TRACT | 0 refills | Status: DC

## 2021-09-30 NOTE — Telephone Encounter (Signed)
ATC left detailed message on VM. Placed Breztri samples behind check in desk. Nothing further needed at this time.

## 2021-09-30 NOTE — Addendum Note (Signed)
Addended by: Gavin Potters R on: 09/30/2021 08:45 AM   Modules accepted: Orders

## 2021-09-30 NOTE — Progress Notes (Signed)
Subjective:  Patient ID: [name], male    DOB: 1940/11/25,   MRN: [id number]  Chief Complaint  Patient presents with   Foot Pain    I am doing ok and the right big toe is turning toward the 2nd toe     80 y.o. male presents for follow-up of right toe wound. Relates he has been dressing as instructed and finished the antibiotics. Doing well. Has a history of infection in the bone in that toe. Relates he has been taking etodolac. Concern for Creatinine level and long term NSAID use.  Denies any other pedal complaints. Denies n/v/f/c.   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 CFT <3 seconds. Normal hair growth on digits. Edema noted to right lower extremity. Right foot significantly cold.   Skin. No lacerations or abrasions bilateral feet. Xerosis noted to bilateral lower extremities. Nails 1-5 are thickened discolored and elongated with subungual debris.  0.1 x 0.1 cm granular ulcer to distal tip of right hallux noticed upon debridement. Mild erythema to digit and lower extremitiy. Profuse bleeding noted to wound site.  Musculoskeletal: MMT 5/5 bilateral lower  extremities in DF, PF, Inversion and Eversion. Deceased ROM in DF of ankle joint.  Neurological: Sensation intact to light touch.   Assessment:   1. Ulcer of right foot limited to breakdown of skin ([place])   2. Prediabetes       Plan:  Patient was evaluated and treated and all questions answered. Ulcer right hallux limited to breakdown of skin X-rays reviewed. No osseous erosion noted currently to the distal right hallux.  -Debridement as below. -Dressed with silvadene, DSD. -Additional prescription for keflex provided.  -Referral for ABIs placed to evaluate cold feet.  -Dicussed trimming nails at next visit.  -Discussed glucose control and proper protein-rich diet.  -Discussed if any worsening redness, pain, fever or chills to call or may need to report to the emergency room. Patient expressed understanding.   Procedure: Excisional Debridement of Wound Rationale: Removal of non-viable soft tissue from the wound to promote healing.  Anesthesia: none Pre-Debridement Wound Measurements: hyperkeratotic tissue  Post-Debridement Wound Measurements: 0.2  cm x 0.1 cm x 0.1 cm  Type of Debridement: Sharp Excisional Tissue Removed: Non-viable soft tissue Depth of Debridement: subcutaneous tissue. Technique: Sharp excisional debridement to bleeding, viable wound base.  Dressing: Dry, sterile, compression dressing. Disposition: Patient tolerated procedure well. Patient to  return in 2 week for follow-up.  No follow-ups on file.   Lorenda Peck, DPM

## 2021-10-01 DIAGNOSIS — L97511 Non-pressure chronic ulcer of other part of right foot limited to breakdown of skin: Secondary | ICD-10-CM | POA: Diagnosis not present

## 2021-10-01 DIAGNOSIS — J301 Allergic rhinitis due to pollen: Secondary | ICD-10-CM | POA: Diagnosis not present

## 2021-10-01 DIAGNOSIS — G4733 Obstructive sleep apnea (adult) (pediatric): Secondary | ICD-10-CM | POA: Diagnosis not present

## 2021-10-01 DIAGNOSIS — E782 Mixed hyperlipidemia: Secondary | ICD-10-CM | POA: Diagnosis not present

## 2021-10-01 DIAGNOSIS — N401 Enlarged prostate with lower urinary tract symptoms: Secondary | ICD-10-CM | POA: Diagnosis not present

## 2021-10-01 DIAGNOSIS — R600 Localized edema: Secondary | ICD-10-CM | POA: Diagnosis not present

## 2021-10-01 DIAGNOSIS — F331 Major depressive disorder, recurrent, moderate: Secondary | ICD-10-CM | POA: Diagnosis not present

## 2021-10-01 DIAGNOSIS — K219 Gastro-esophageal reflux disease without esophagitis: Secondary | ICD-10-CM | POA: Diagnosis not present

## 2021-10-01 DIAGNOSIS — J4520 Mild intermittent asthma, uncomplicated: Secondary | ICD-10-CM | POA: Diagnosis not present

## 2021-10-01 DIAGNOSIS — M48062 Spinal stenosis, lumbar region with neurogenic claudication: Secondary | ICD-10-CM | POA: Diagnosis not present

## 2021-10-01 DIAGNOSIS — G479 Sleep disorder, unspecified: Secondary | ICD-10-CM | POA: Diagnosis not present

## 2021-10-02 ENCOUNTER — Ambulatory Visit (HOSPITAL_COMMUNITY)
Admission: RE | Admit: 2021-10-02 | Discharge: 2021-10-02 | Disposition: A | Discharge status: Home / Self Care | Payer: Medicare Other | Source: Ambulatory Visit | Attending: Podiatry | Admitting: Podiatry

## 2021-10-02 DIAGNOSIS — L97511 Non-pressure chronic ulcer of other part of right foot limited to breakdown of skin: Secondary | ICD-10-CM

## 2021-10-02 DIAGNOSIS — R7303 Prediabetes: Secondary | ICD-10-CM | POA: Diagnosis not present

## 2021-10-02 NOTE — Telephone Encounter (Signed)
Contacted [employer] to check on the status- spoke with Lana who processed the application while on the phone- patient is approved from 10/02/2021 through 11/09/2022. Lana sent request to pharmacy [phone]. Will mail mail to patient and fax the office a letter as well. Left detailed message on patient's voicemail informing of this information & letting him know Breztri samples are upfront to pickup- informed of holiday hours.

## 2021-10-15 ENCOUNTER — Encounter: Payer: Self-pay | Admitting: Podiatry

## 2021-10-27 DIAGNOSIS — Z20828 Contact with and (suspected) exposure to other viral communicable diseases: Secondary | ICD-10-CM | POA: Diagnosis not present

## 2021-10-28 ENCOUNTER — Other Ambulatory Visit: Payer: Self-pay

## 2021-10-28 ENCOUNTER — Ambulatory Visit (INDEPENDENT_AMBULATORY_CARE_PROVIDER_SITE_OTHER): Payer: Medicare Other | Admitting: Podiatry

## 2021-10-28 DIAGNOSIS — H35342 Macular cyst, hole, or pseudohole, left eye: Secondary | ICD-10-CM | POA: Diagnosis not present

## 2021-10-28 DIAGNOSIS — L97512 Non-pressure chronic ulcer of other part of right foot with fat layer exposed: Secondary | ICD-10-CM

## 2021-10-28 DIAGNOSIS — H04123 Dry eye syndrome of bilateral lacrimal glands: Secondary | ICD-10-CM | POA: Diagnosis not present

## 2021-10-28 DIAGNOSIS — I872 Venous insufficiency (chronic) (peripheral): Secondary | ICD-10-CM

## 2021-10-28 DIAGNOSIS — H0288A Meibomian gland dysfunction right eye, upper and lower eyelids: Secondary | ICD-10-CM | POA: Diagnosis not present

## 2021-10-28 DIAGNOSIS — H02831 Dermatochalasis of right upper eyelid: Secondary | ICD-10-CM | POA: Diagnosis not present

## 2021-10-28 DIAGNOSIS — Z961 Presence of intraocular lens: Secondary | ICD-10-CM | POA: Diagnosis not present

## 2021-10-28 DIAGNOSIS — H02834 Dermatochalasis of left upper eyelid: Secondary | ICD-10-CM | POA: Diagnosis not present

## 2021-10-28 DIAGNOSIS — H35373 Puckering of macula, bilateral: Secondary | ICD-10-CM | POA: Diagnosis not present

## 2021-10-28 DIAGNOSIS — H0288B Meibomian gland dysfunction left eye, upper and lower eyelids: Secondary | ICD-10-CM | POA: Diagnosis not present

## 2021-10-28 MED ORDER — DOXYCYCLINE HYCLATE 100 MG PO TABS: 100.0000 mg | ORAL_TABLET | Freq: Two times a day (BID) | ORAL | 0 refills | Status: DC

## 2021-10-30 NOTE — Progress Notes (Signed)
Subjective: 80 year old male presents the office today for evaluation of wounds.  He has been under the care of Dr. Blenda Mounts for this.  Unfortunate there was an issue with check-in and the patient has been waiting in the office for quite some time so is happy to see him today.  He states that he had a wound in the only changes the bandage becomes the office.  He wears compression socks but he does not change them and wears them all the time they started to get holes in them.  He showers with the compression socks on as well.  Currently denies any fevers or chills.  Objective: AAO x3, NAD DP/PT pulses palpable bilaterally, CRT less than 3 seconds Bilateral chronic edema present. Sensation decreased.   Significant dry skin present of the feet and legs upon removal to compression socks.  Ulceration noted at the distal aspect of the toe measuring 1 x 0.3 cm without any probing, undermining or tunneling.  There is no surrounding erythema, ascending cellulitis.  There is also superficial wound to the lateral aspect of right leg.  Again no drainage or pus.  No pain with calf compression, swelling, warmth, erythema  Assessment: Ulcerations right side  Plan: -All treatment options discussed with the patient including all alternatives, risks, complications.  -I cleaned the legs bilaterally remove as much the dry skin as possible.  Also sharply debrided the wound utilizing #312 Scalpel down to healthy, bleeding, granular tissue in order to help promote wound healing.  The wounds were cleaned.  Small mount of Betadine was applied followed by dressing.  I then wrapped the lower legs bilaterally from the toes to just below the knee with Unna boots.  Advised him not to get these wet. -I offered to get a home health nurse come to the house but he declines this. -ABIs completed. -Doxycycline -Monitor for any clinical signs or symptoms of infection and directed to call the office immediately should any occur or go to  the ER.  *X-ray next appointment -Patient encouraged to call the office with any questions, concerns, change in symptoms.

## 2021-11-05 ENCOUNTER — Ambulatory Visit (INDEPENDENT_AMBULATORY_CARE_PROVIDER_SITE_OTHER): Payer: Medicare Other

## 2021-11-05 ENCOUNTER — Encounter: Payer: Self-pay | Admitting: Podiatry

## 2021-11-05 ENCOUNTER — Other Ambulatory Visit: Payer: Self-pay

## 2021-11-05 ENCOUNTER — Ambulatory Visit (INDEPENDENT_AMBULATORY_CARE_PROVIDER_SITE_OTHER): Payer: Medicare Other | Admitting: Podiatry

## 2021-11-05 DIAGNOSIS — L97511 Non-pressure chronic ulcer of other part of right foot limited to breakdown of skin: Secondary | ICD-10-CM | POA: Diagnosis not present

## 2021-11-05 NOTE — Progress Notes (Signed)
°  Subjective:  Patient ID: [name], male    DOB: 1941/03/31,   MRN: [id number]  No chief complaint on file.    80 y.o. male presents for follow-up of right toe wound. Unfortunately due to check in issues was not able to see me last week and saw Dr. Jacqualyn Posey. He was placed two Unna boots at that time. Doing well. Has a history of infection in the bone in that toe.   Denies any other pedal complaints. Denies n/v/f/c.   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 CFT <3 seconds. Normal hair growth on digits. Edema noted to right lower extremity. Right foot significantly cold.   Skin. No lacerations or abrasions bilateral feet. Xerosis noted to bilateral lower extremities. Nails 1-5 are thickened discolored and elongated with subungual debris.  0.1 x 0.1 cm granular ulcer to distal tip of right hallux noticed upon debridement. Mild erythema to digit and lower extremitiy. Bilateral xeriosis noted. Swelling improved on left, right still swollen with mild erythema noted.  Musculoskeletal: MMT 5/5 bilateral lower extremities in DF, PF, Inversion and Eversion. Deceased ROM in DF  of ankle joint.  Neurological: Sensation intact to light touch.   Assessment:   1. Ulcer of right foot with fat layer exposed ([place])       Plan:  Patient was evaluated and treated and all questions answered. Ulcer right hallux limited to breakdown of skin, right leg wound ,limited to skin X-rays reviewed. No osseous erosion noted currently to the distal right hallux.  -Debridement as below. -Dressed with betadine DSD and two unna boots bilatearl.   -No abx indicated today.  -Discussed glucose control and proper protein-rich diet.  -Discussed if any worsening redness, pain, fever or chills to call or may need to report to the emergency room. Patient expressed understanding.   Procedure: Excisional Debridement of Wound Rationale: Removal of non-viable soft tissue from the wound to promote healing.  Anesthesia: none Pre-Debridement Wound Measurements: hyperkeratotic tissue  Post-Debridement Wound Measurements: 0.1  cm x 0.1 cm x 0.1 cm  Type of Debridement: Sharp Excisional Tissue Removed: Non-viable soft tissue Depth of Debridement: subcutaneous tissue. Technique: Sharp excisional debridement to bleeding, viable wound base.  Dressing: Dry, sterile, compression dressing. Disposition: Patient tolerated procedure well. Patient to return in 2 week for follow-up.  No follow-ups on file.   Lorenda Peck, DPM

## 2021-11-12 ENCOUNTER — Ambulatory Visit (INDEPENDENT_AMBULATORY_CARE_PROVIDER_SITE_OTHER): Payer: Medicare Other | Admitting: Podiatry

## 2021-11-12 ENCOUNTER — Other Ambulatory Visit: Payer: Self-pay

## 2021-11-12 DIAGNOSIS — L97511 Non-pressure chronic ulcer of other part of right foot limited to breakdown of skin: Secondary | ICD-10-CM | POA: Diagnosis not present

## 2021-11-12 DIAGNOSIS — I872 Venous insufficiency (chronic) (peripheral): Secondary | ICD-10-CM

## 2021-11-12 NOTE — Progress Notes (Signed)
Subjective:  Patient ID: [name], male    DOB: April 11, 1941,   MRN: [id number]  Chief Complaint  Patient presents with   Wound Check    Wound check      81 y.o. male presents for follow-up of right toe wound. Relates he is doing about the same. Relates the swelling has improved in the left and right.Has been keeping the bandages dry.  Has a history of infection in the bone in that toe.   Denies any other pedal complaints. Denies n/v/f/c.   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 CFT <3 seconds. Normal hair growth on digits. Edema noted to right lower extremity. Right foot significantly cold.   Skin. No lacerations or abrasions bilateral feet. Xerosis noted to bilateral lower extremities. Nails 1-5 are thickened discolored and elongated with subungual debris.  0.1 x 0.1 cm granular ulcer to distal tip of right hallux noticed upon debridement. Mild erythema to digit and lower extremitiy. Bilateral xeriosis noted. Swelling improved on left, right still swollen with mild erythema noted. 2 cm x 0.5 cm wound over dorsal right leg with drainage present. No erythema or  edema noted.  Musculoskeletal: MMT 5/5 bilateral lower extremities in DF, PF, Inversion and Eversion. Deceased ROM in DF of ankle joint.  Neurological: Sensation intact to light touch.   Assessment:   1. Ulcer of right foot, limited to breakdown of skin ([place])   2. Non-pressure chronic ulcer of other part of right foot limited to breakdown of skin ([place])   3. Venous (peripheral) insufficiency        Plan:  Patient was evaluated and treated and all questions answered. Ulcer right hallux limited to breakdown of skin, right leg wound ,limited to skin X-rays reviewed. No osseous erosion noted currently to the distal right hallux.  -Debridement as below. -Dressed with betadine DSD and  unna boot on the right.  Compression stocking on the left.  -No abx indicated today.  -Discussed glucose control and proper protein-rich diet.  -Discussed if any worsening redness, pain, fever or chills to call or may need to report to the emergency room. Patient expressed understanding.   Procedure: Excisional Debridement of Wound Rationale: Removal of non-viable soft tissue from the wound to promote healing.  Anesthesia: none Pre-Debridement Wound Measurements: hyperkeratotic tissue  Post-Debridement Wound Measurements: 0.1  cm x 0.1 cm x 0.1 cm  Type of Debridement:  Sharp Excisional Tissue Removed: Non-viable soft tissue Depth of Debridement: subcutaneous tissue. Technique: Sharp excisional debridement to bleeding, viable wound base.  Dressing: Dry, sterile, compression dressing. Disposition: Patient tolerated procedure well. Patient to return in 2 week for follow-up.  Return in about 1 week (around 11/19/2021) for wound check.   Lorenda Peck, DPM

## 2021-11-13 DIAGNOSIS — E291 Testicular hypofunction: Secondary | ICD-10-CM | POA: Diagnosis not present

## 2021-11-19 ENCOUNTER — Ambulatory Visit: Payer: Medicare Other | Admitting: Podiatry

## 2021-11-20 ENCOUNTER — Telehealth: Payer: Self-pay | Admitting: *Deleted

## 2021-11-20 DIAGNOSIS — R3915 Urgency of urination: Secondary | ICD-10-CM | POA: Diagnosis not present

## 2021-11-20 DIAGNOSIS — E291 Testicular hypofunction: Secondary | ICD-10-CM | POA: Diagnosis not present

## 2021-11-20 NOTE — Telephone Encounter (Signed)
Patient is calling to reschedule his missed appointment, locked keys,phone in car.

## 2021-11-22 ENCOUNTER — Telehealth: Payer: Self-pay | Admitting: *Deleted

## 2021-11-25 NOTE — Telephone Encounter (Signed)
error 

## 2021-11-26 ENCOUNTER — Other Ambulatory Visit: Payer: Self-pay

## 2021-11-26 ENCOUNTER — Ambulatory Visit: Payer: Medicare Other | Admitting: Podiatry

## 2021-11-26 ENCOUNTER — Ambulatory Visit (INDEPENDENT_AMBULATORY_CARE_PROVIDER_SITE_OTHER): Payer: Medicare Other | Admitting: Podiatry

## 2021-11-26 ENCOUNTER — Encounter: Payer: Self-pay | Admitting: Podiatry

## 2021-11-26 DIAGNOSIS — L97511 Non-pressure chronic ulcer of other part of right foot limited to breakdown of skin: Secondary | ICD-10-CM

## 2021-11-26 DIAGNOSIS — I872 Venous insufficiency (chronic) (peripheral): Secondary | ICD-10-CM

## 2021-11-26 NOTE — Progress Notes (Signed)
°  Subjective:  Patient ID: [name], male    DOB: 1940-12-12,   MRN: [id number]  No chief complaint on file.    81 y.o. male presents for follow-up of right toe wound. Relates he had to miss his appointment last week due to losing his keys and phone. States he almost had to miss today's appointment.  Relates the swelling has improved in the left and right.Has been keeping the bandages dry.  Has a history of infection in the bone in that toe.   Denies any other pedal complaints. Denies n/v/f/c.   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 CFT <3 seconds. Normal hair growth on digits. Edema noted to right lower extremity. Right foot significantly cold.   Skin. No lacerations or abrasions bilateral feet. Xerosis noted to bilateral lower extremities. Nails 1-5 are thickened discolored and elongated with subungual debris.  Healed wound to right hallux.  Mild erythema to digit and lower extremitiy. Bilateral xeriosis noted. Swelling worsened on the left  right still swollen with mild erythema noted. 1 cm x 0.5 cm wound over dorsal right leg with drainage present. No erythema  or edema noted.  Musculoskeletal: MMT 5/5 bilateral lower extremities in DF, PF, Inversion and Eversion. Deceased ROM in DF of ankle joint.  Neurological: Sensation intact to light touch.   Assessment:   No diagnosis found.      Plan:  Patient was evaluated and treated and all questions answered. Ulcer right hallux wound healed, right leg wound ,limited to skin X-rays reviewed. No osseous erosion noted currently to the distal right hallux.  -Debridement as below. -Right hallux wound heealed leg wound dressed with silvaden and  unna boot on the right.  Compression stocking on the left.  -No abx indicated today.  -Discussed glucose control and proper protein-rich diet.  -Discussed if any worsening redness, pain, fever or chills to call or may need to report to the emergency room. Patient expressed understanding.  Follow-up in one week.   No follow-ups on file.   Lorenda Peck, DPM

## 2021-12-02 ENCOUNTER — Other Ambulatory Visit: Payer: Self-pay

## 2021-12-02 ENCOUNTER — Telehealth: Payer: Self-pay | Admitting: *Deleted

## 2021-12-02 ENCOUNTER — Encounter (HOSPITAL_COMMUNITY): Payer: Self-pay

## 2021-12-02 ENCOUNTER — Emergency Department (HOSPITAL_COMMUNITY): Payer: Medicare Other

## 2021-12-02 ENCOUNTER — Emergency Department (HOSPITAL_COMMUNITY)
Admission: EM | Admit: 2021-12-02 | Discharge: 2021-12-02 | Disposition: A | Discharge status: Home / Self Care | Payer: Medicare Other | Attending: Emergency Medicine | Admitting: Emergency Medicine

## 2021-12-02 ENCOUNTER — Ambulatory Visit (INDEPENDENT_AMBULATORY_CARE_PROVIDER_SITE_OTHER): Payer: Medicare Other | Admitting: Podiatry

## 2021-12-02 ENCOUNTER — Encounter: Payer: Self-pay | Admitting: Podiatry

## 2021-12-02 DIAGNOSIS — L97511 Non-pressure chronic ulcer of other part of right foot limited to breakdown of skin: Secondary | ICD-10-CM | POA: Diagnosis not present

## 2021-12-02 DIAGNOSIS — R7989 Other specified abnormal findings of blood chemistry: Secondary | ICD-10-CM | POA: Diagnosis not present

## 2021-12-02 DIAGNOSIS — R0602 Shortness of breath: Secondary | ICD-10-CM

## 2021-12-02 DIAGNOSIS — R0789 Other chest pain: Secondary | ICD-10-CM | POA: Insufficient documentation

## 2021-12-02 DIAGNOSIS — I10 Essential (primary) hypertension: Secondary | ICD-10-CM | POA: Diagnosis not present

## 2021-12-02 DIAGNOSIS — R5381 Other malaise: Secondary | ICD-10-CM | POA: Diagnosis not present

## 2021-12-02 DIAGNOSIS — R42 Dizziness and giddiness: Secondary | ICD-10-CM | POA: Diagnosis not present

## 2021-12-02 DIAGNOSIS — H538 Other visual disturbances: Secondary | ICD-10-CM | POA: Diagnosis not present

## 2021-12-02 DIAGNOSIS — Z743 Need for continuous supervision: Secondary | ICD-10-CM | POA: Diagnosis not present

## 2021-12-02 DIAGNOSIS — I499 Cardiac arrhythmia, unspecified: Secondary | ICD-10-CM | POA: Diagnosis not present

## 2021-12-02 DIAGNOSIS — I872 Venous insufficiency (chronic) (peripheral): Secondary | ICD-10-CM

## 2021-12-02 LAB — COMPREHENSIVE METABOLIC PANEL
ALT: 35 U/L (ref 0–44)
AST: 47 U/L — ABNORMAL HIGH (ref 15–41)
Albumin: 4.1 g/dL (ref 3.5–5.0)
Alkaline Phosphatase: 103 U/L (ref 38–126)
Anion gap: 9 (ref 5–15)
BUN: 18 mg/dL (ref 8–23)
CO2: 29 mmol/L (ref 22–32)
Calcium: 8.6 mg/dL — ABNORMAL LOW (ref 8.9–10.3)
Chloride: 101 mmol/L (ref 98–111)
Creatinine, Ser: 1.61 mg/dL — ABNORMAL HIGH (ref 0.61–1.24)
GFR, Estimated: 43 mL/min — ABNORMAL LOW (ref >60)
Glucose, Bld: 94 mg/dL (ref 70–99)
Potassium: 3.7 mmol/L (ref 3.5–5.1)
Sodium: 139 mmol/L (ref 135–145)
Total Bilirubin: 1.3 mg/dL — ABNORMAL HIGH (ref 0.3–1.2)
Total Protein: 7.5 g/dL (ref 6.5–8.1)

## 2021-12-02 LAB — TROPONIN I (HIGH SENSITIVITY): Troponin I (High Sensitivity): 14 ng/L (ref <18)

## 2021-12-02 LAB — CBC WITH DIFFERENTIAL/PLATELET
Abs Immature Granulocytes: 0.02 10*3/uL (ref 0.00–0.07)
Basophils Absolute: 0 10*3/uL (ref 0.0–0.1)
Basophils Relative: 0 %
Eosinophils Absolute: 0.2 10*3/uL (ref 0.0–0.5)
Eosinophils Relative: 3 %
HCT: 46.8 % (ref 39.0–52.0)
Hemoglobin: 15.6 g/dL (ref 13.0–17.0)
Immature Granulocytes: 0 %
Lymphocytes Relative: 13 %
Lymphs Abs: 0.9 10*3/uL (ref 0.7–4.0)
MCH: 31.1 pg (ref 26.0–34.0)
MCHC: 33.3 g/dL (ref 30.0–36.0)
MCV: 93.4 fL (ref 80.0–100.0)
Monocytes Absolute: 0.6 10*3/uL (ref 0.1–1.0)
Monocytes Relative: 8 %
Neutro Abs: 5.2 10*3/uL (ref 1.7–7.7)
Neutrophils Relative %: 76 %
Platelets: 177 10*3/uL (ref 150–400)
RBC: 5.01 MIL/uL (ref 4.22–5.81)
RDW: 13.7 % (ref 11.5–15.5)
WBC: 7 10*3/uL (ref 4.0–10.5)
nRBC: 0 % (ref 0.0–0.2)

## 2021-12-02 LAB — LIPASE, BLOOD: Lipase: 51 U/L (ref 11–51)

## 2021-12-02 LAB — D-DIMER, QUANTITATIVE: D-Dimer, Quant: 3.15 ug/mL-FEU — ABNORMAL HIGH (ref 0.00–0.50)

## 2021-12-02 MED ORDER — IOHEXOL 350 MG/ML SOLN
75.0000 mL | Freq: Once | INTRAVENOUS | Status: AC | PRN
  Administered 2021-12-02: 75 mL via INTRAVENOUS

## 2021-12-02 MED ORDER — NITROGLYCERIN 0.4 MG SL SUBL
0.4000 mg | SUBLINGUAL_TABLET | SUBLINGUAL | Status: DC | PRN
  Administered 2021-12-02: 0.4 mg via SUBLINGUAL
  Filled 2021-12-02: qty 25

## 2021-12-02 MED ORDER — SODIUM CHLORIDE (PF) 0.9 % IJ SOLN
INTRAMUSCULAR | Status: AC
  Filled 2021-12-02: qty 50

## 2021-12-02 NOTE — ED Provider Notes (Signed)
Signout from Dr. Tyrone Nine.  81 year old male here with hypertension.  Has had malaise and belching for the last few days.  Chest congestion.  Using Afrin.  Blood pressure elevated here.  Plan is to follow-up on results of CT angio chest. Physical Exam  BP (!) 195/114    Pulse 81    Temp 97.9 F (36.6 C) (Oral)    Resp 18    Ht 5' 8.5" (1.74 m)    Wt 113.4 kg    SpO2 91%    BMI 37.46 kg/m   Physical Exam  Procedures  Procedures  ED Course / MDM    Medical Decision Making Amount and/or Complexity of Data Reviewed Labs: ordered. Radiology: ordered. ECG/medicine tests: ordered.  Risk Prescription drug management.  CT negative for acute PE.  Blood pressure slightly improved.  Patient states he is feeling a little improved.  Recommended he continue his blood pressure medications, avoid any stimulants.  Low-salt diet.  Follow-up with his treating providers.  Return to the emergency department if any worsening or concerning symptoms.  Considered admission, currently no indications.        [name] Rasmussen, MD 12/03/21 [id number]

## 2021-12-02 NOTE — ED Notes (Signed)
Pt in bed, pt denies chest pain, pt remains htn, nitro given per md instructions.

## 2021-12-02 NOTE — ED Notes (Signed)
Pt in bed, pt denies chest pain or sob.

## 2021-12-02 NOTE — Progress Notes (Signed)
Subjective:  Patient ID: [name], male    DOB: 06-Jul-1941,   MRN: [id number]  No chief complaint on file.    81 y.o. male presents for follow-up of right toe wound. Doing well. Has been keeping the bandages dry.  Has a history of infection in the bone in that toe.   Denies any other pedal complaints. Denies n/v/f/c.   Past Medical History:  Diagnosis Date   Allergy    takes Zyrtec daily   Anxiety    takes Ativan daily as needed   Arthritis    Asthma    Back pain    BPH (benign prostatic hyperplasia)    takes doxazosin for   Carpal tunnel syndrome    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   L4, L5 ,S1   Depression    takes Wellbutrin daily   Dyspnea on exertion    with exertion   GERD (gastroesophageal reflux disease)    takes Nexium and Omeprazole daily   History of kidney stones    HTN (hypertension)    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   Hyperlipidemia    Joint pain    Neuromuscular disorder ([place])    Prediabetes    Sleep apnea    uses CPAP nightly   SOB (shortness of breath)    Swallowing difficulty    Swelling of lower extremity    right more than leg leg   Umbilical hernia     Objective:  Physical Exam: Vascular: DP/PT pulses 2/4 bilateral. CFT <3 seconds. Normal hair growth on digits. Edema noted to right lower extremity. Right foot significantly cold.   Skin. No lacerations or abrasions bilateral feet. Xerosis noted to bilateral lower extremities. Nails 1-5 are thickened discolored and elongated with subungual debris.  Healed wound to right hallux.  Mild erythema to digit and lower extremitiy. Bilateral xeriosis noted. Swelling worsened on the left  right still swollen with mild erythema noted. 0.7 cm x 0.5 cm wound over dorsal right leg with drainage present. No erythema or edema noted.  Musculoskeletal: MMT 5/5 bilateral lower extremities in DF, PF, Inversion and Eversion. Deceased ROM in DF of ankle joint.  Neurological: Sensation intact to  light touch.   Assessment:   1. Non-pressure chronic ulcer of other part of right foot limited to breakdown of skin ([place])   2. Venous (peripheral) insufficiency         Plan:  Patient was evaluated and treated and all questions answered. Ulcer right hallux wound healed, right leg wound ,limited to skin X-rays reviewed. No osseous erosion noted currently to the distal right hallux.  -Debridement of right leg  wound below.  -Right hallux wound heealed leg wound dressed with silvadene and  unna boot on the right.  Compression stocking on the left.  -No abx indicated today.  -Discussed glucose control and proper protein-rich diet.  -Discussed if any worsening redness, pain, fever or chills to call or may need to report to the emergency room. Patient expressed understanding.   Procedure: Excisional Debridement of Wound Rationale: Removal of non-viable soft tissue from the wound to promote healing.  Anesthesia: none Pre-Debridement Wound Measurements: 0.7 cm x 0.5 cm x 0.2 cm  Post-Debridement Wound Measurements: 0.7 cm x 0.5 cm x 0.2cm  Type of Debridement: Sharp Excisional Tissue Removed: Non-viable soft tissue Depth of Debridement: subcutaneous tissue. Technique: Sharp excisional debridement to bleeding, viable wound base.  Dressing: Dry, sterile, compression dressing. Disposition: Patient tolerated procedure well. Patient to  return in 1 week for follow-up.  No follow-ups on file.   Follow-up in one week.   No follow-ups on file.   Lorenda Peck, DPM

## 2021-12-02 NOTE — ED Provider Notes (Signed)
[facility] DEPT Provider Note   CSN: [id number] Arrival date & time: 12/02/21  1237     History  Chief Complaint  Patient presents with   Hypertension    [name] is a 81 y.o. male.  81 yo M with a chief complaints of hypertension.  The patient tells me that he was at his podiatrist office today and asked that his blood pressure be checked.  He tells me that he has been having frequent belching over the past couple days.  He also felt like he has been a bit more congested than normal.  He took a dose of Mucinex but he thinks it was a couple days ago also did a dose of Afrin.  He denies any other change in medications.  He was hypertensive at his podiatrist office and they suggested he come to the ED and said he went to go see his cardiologist in the office who checked his blood pressure and then sent him here.  He will not specifically say that he has chest pain.  Has a dull ache in the back of his eyes when he closes them.  He denies any cough or congestion.  Denies overt headache.  Denies one-sided numbness or weakness of his difficulty speech or swallowing.  Sometimes his vision feels a bit blurry.  The history is provided by the patient.  Hypertension Associated symptoms include chest pain. Pertinent negatives include no abdominal pain, no headaches and no shortness of breath.  Illness Severity:  Moderate Onset quality:  Gradual Duration:  2 days Timing:  Intermittent Progression:  Waxing and waning Chronicity:  New Associated symptoms: chest pain and congestion   Associated symptoms: no abdominal pain, no diarrhea, no fever, no headaches, no myalgias, no rash, no shortness of breath and no vomiting       Home Medications Prior to Admission medications   Medication Sig Start Date End Date Taking? Authorizing Provider  Albuterol Sulfate (PROAIR RESPICLICK) 096 (90 Base) MCG/ACT AEPB Inhale 2 puffs into the lungs every 6 (six) hours as  needed. Patient taking differently: Inhale 2 puffs into the lungs every 6 (six) hours as needed (for shortness of breath). 04/05/21  Yes Martyn Ehrich, NP  budesonide-formoterol [facility]) 160-4.5 MCG/ACT inhaler Inhale 2 puffs into the lungs 2 (two) times daily. 08/21/21  Yes Mannam, Praveen, MD  buPROPion (WELLBUTRIN XL) 300 MG 24 hr tablet TAKE 1 TABLET BY MOUTH DAILY *NEEDS OFFICE VISIT Patient taking differently: Take 300 mg by mouth daily. 03/11/16  Yes [name], MD  calcium carbonate 200 MG capsule Take 200 mg by mouth daily.    Yes [provider]  doxazosin (CARDURA) 8 MG tablet TAKE 1 TABLET BY MOUTH DAILY  . Take at night Patient taking differently: Take 8 mg by mouth every evening. 03/11/16  Yes [name], MD  esomeprazole (NEXIUM) 40 MG capsule Take 1 capsule (40 mg total) by mouth 2 (two) times daily before a meal. Patient taking differently: Take 40 mg by mouth daily before breakfast. 07/24/21  Yes Thornton Park, MD  etodolac (LODINE) 400 MG tablet Take 1 tablet (400 mg total) by mouth 2 (two) times daily as needed. Patient taking differently: Take 400 mg by mouth See admin instructions. Take 400 mg by mouth in the evening when not taking Meloxicam 12/17/20  Yes Hilts, Michael, MD  fluticasone (FLONASE) 50 MCG/ACT nasal spray Place 1 spray into both nostrils daily. Patient taking differently: Place 1 spray  into both nostrils in the morning and at bedtime. 04/05/21  Yes Martyn Ehrich, NP  furosemide (LASIX) 40 MG tablet Take 40 mg by mouth daily.   Yes [provider]  gabapentin (NEURONTIN) 300 MG capsule TAKE 1 TO 2 CAPSULES BY MOUTH AT BEDTIME Patient taking differently: Take 600 mg by mouth at bedtime. 01/15/17  Yes Tereasa Coop, PA-C  glucosamine-chondroitin 500-400 MG tablet Take 1 tablet by mouth 2 (two) times daily.   Yes [provider]  guaiFENesin (MUCINEX) 600 MG 12 hr tablet Take 1 tablet (600 mg total) by mouth 2 (two) times  daily as needed. Patient taking differently: Take 600 mg by mouth 2 (two) times daily as needed for cough or to loosen phlegm. 04/05/21  Yes Martyn Ehrich, NP  meloxicam (MOBIC) 15 MG tablet Take 15 mg by mouth every evening.   Yes [provider]  methocarbamol (ROBAXIN) 500 MG tablet Take 1 tablet (500 mg total) by mouth every 8 (eight) hours as needed for muscle spasms. Patient taking differently: Take 500 mg by mouth in the morning and at bedtime. 08/08/21  Yes Loni Beckwith, PA-C  mirtazapine (REMERON) 15 MG tablet Take 15 mg by mouth at bedtime.   Yes [provider]  Multiple Vitamins-Minerals (CENTRUM SILVER) tablet Take 1 tablet by mouth daily.   Yes [provider]  potassium chloride SA (K-DUR,KLOR-CON) 20 MEQ tablet Take 1 tablet (20 mEq total) by mouth 2 (two) times daily. 07/23/16  Yes [name], MD  rosuvastatin (CRESTOR) 5 MG tablet Take 5 mg by mouth daily. 12/20/18  Yes [provider]  testosterone cypionate (DEPOTESTOSTERONE CYPIONATE) 200 MG/ML injection every Saturday. 08/14/21  Yes [provider]  B-D 3CC LUER-LOK SYR 21GX1-1/2 21G X 1-1/2" 3 ML MISC USE TO INJECT DEPOTESTOSTERONE. 07/28/16   [name], MD  Budeson-Glycopyrrol-Formoterol (BREZTRI AEROSPHERE) 160-9-4.8 MCG/ACT AERO Inhale 2 puffs into the lungs in the morning and at bedtime. Patient not taking: Reported on 12/02/2021 09/30/21   Marshell Garfinkel, MD  doxycycline (VIBRA-TABS) 100 MG tablet Take 1 tablet (100 mg total) by mouth 2 (two) times daily. Patient not taking: Reported on 12/02/2021 10/28/21   Trula Slade, DPM  flurazepam [facility]) 15 MG capsule Take by mouth.    [provider]  furosemide (LASIX) 20 MG tablet TAKE 2 TABLETS BY MOUTH ONCE A DAY Patient not taking: Reported on 12/02/2021 12/22/16   Jaynee Eagles, PA-C  HYDROcodone-acetaminophen [facility]) 5-325 MG tablet 1 po q d prn pain Patient not taking: Reported on 12/02/2021 07/06/17    Meredith Pel, MD  LORazepam (ATIVAN) 1 MG tablet Take 0.5-1 mg by mouth at bedtime.    [provider]  NEEDLE, DISP, 21 G 21G X 1-1/2" MISC Use to inject depotestosterone. 06/11/15   Harrison Mons, PA  sildenafil (REVATIO) 20 MG tablet Take by mouth.    [provider]  Syringe, Reusable, 3 ML MISC Use to inject depotestosterone. 06/11/15   Harrison Mons, PA      Allergies    Patient has no known allergies.    Review of Systems   Review of Systems  Constitutional:  Negative for chills and fever.  HENT:  Positive for congestion. Negative for facial swelling.   Eyes:  Positive for visual disturbance. Negative for discharge.  Respiratory:  Negative for shortness of breath.   Cardiovascular:  Positive for chest pain. Negative for palpitations.  Gastrointestinal:  Negative for abdominal pain, diarrhea and  vomiting.  Musculoskeletal:  Negative for arthralgias and myalgias.  Skin:  Negative for color change and rash.  Neurological:  Positive for light-headedness. Negative for tremors, syncope and headaches.  Psychiatric/Behavioral:  Negative for confusion and dysphoric mood.    Physical Exam Updated Vital Signs BP 125/62 (BP Location: Right Arm)    Pulse 73    Temp 98.4 F (36.9 C) (Oral)    Resp 18    Ht 5' 8.5" (1.74 m)    Wt 113.4 kg    SpO2 96%    BMI 37.46 kg/m  Physical Exam Vitals and nursing note reviewed.  Constitutional:      Appearance: He is well-developed.  HENT:     Head: Normocephalic and atraumatic.  Eyes:     Pupils: Pupils are equal, round, and reactive to light.  Neck:     Vascular: No JVD.  Cardiovascular:     Rate and Rhythm: Normal rate and regular rhythm.     Heart sounds: No murmur heard.   No friction rub. No gallop.  Pulmonary:     Effort: No respiratory distress.     Breath sounds: No wheezing.  Abdominal:     General: There is no distension.     Tenderness: There is no abdominal tenderness. There is no guarding or  rebound.  Musculoskeletal:        General: Normal range of motion.     Cervical back: Normal range of motion and neck supple.  Skin:    Coloration: Skin is not pale.     Findings: No rash.  Neurological:     Mental Status: He is alert and oriented to person, place, and time.     GCS: GCS eye subscore is 4. GCS verbal subscore is 5. GCS motor subscore is 6.     Cranial Nerves: Cranial nerves 2-12 are intact.     Sensory: Sensation is intact.     Motor: Motor function is intact.     Coordination: Coordination is intact.     Comments: No obvious neurologic deficit on exam.  Has chronic problems with both of his legs which limits exam.  No obvious weakness.  Psychiatric:        Behavior: Behavior normal.    ED Results / Procedures / Treatments   Labs (all labs ordered are listed, but only abnormal results are displayed) Labs Reviewed  COMPREHENSIVE METABOLIC PANEL - Abnormal; Notable for the following components:      Result Value   Creatinine, Ser 1.61 (*)    Calcium 8.6 (*)    AST 47 (*)    Total Bilirubin 1.3 (*)    GFR, Estimated 43 (*)    All other components within normal limits  D-DIMER, QUANTITATIVE - Abnormal; Notable for the following components:   D-Dimer, Quant 3.15 (*)    All other components within normal limits  CBC WITH DIFFERENTIAL/PLATELET  LIPASE, BLOOD  TROPONIN I (HIGH SENSITIVITY)    EKG None  Radiology CT Angio Chest PE W and/or Wo Contrast  Result Date: 12/02/2021 CLINICAL DATA:  Positive D-dimer EXAM: CT ANGIOGRAPHY CHEST WITH CONTRAST TECHNIQUE: Multidetector CT imaging of the chest was performed using the standard protocol during bolus administration of intravenous contrast. Multiplanar CT image reconstructions and MIPs were obtained to evaluate the vascular anatomy. RADIATION DOSE REDUCTION: This exam was performed according to the departmental dose-optimization program which includes automated exposure control, adjustment of the mA and/or kV  according to patient size and/or use of iterative reconstruction  technique. CONTRAST:  14mL OMNIPAQUE IOHEXOL 350 MG/ML SOLN COMPARISON:  10/18/2019 FINDINGS: Cardiovascular: No filling defects in the pulmonary arteries to suggest pulmonary emboli. Heart is normal size. Aorta is normal caliber. Scattered coronary artery and aortic calcifications. Mediastinum/Nodes: No mediastinal, hilar, or axillary adenopathy. Trachea and esophagus are unremarkable. Thyroid unremarkable. Lungs/Pleura: Lungs are clear. No focal airspace opacities or suspicious nodules. No effusions. Upper Abdomen: Imaging into the upper abdomen demonstrates no acute findings. Musculoskeletal: Chest wall soft tissues are unremarkable. No acute bony abnormality. Review of the MIP images confirms the above findings. IMPRESSION: No evidence of pulmonary embolus. Coronary artery disease. No acute cardiopulmonary disease. Aortic Atherosclerosis (ICD10-I70.0). Electronically Signed   By: Rolm Baptise M.D.   On: 12/02/2021 17:14   DG Chest Port 1 View  Result Date: 12/02/2021 CLINICAL DATA:  Hypertension EXAM: PORTABLE CHEST 1 VIEW COMPARISON:  08/07/2021 FINDINGS: No focal consolidation. No pleural effusion or pneumothorax. Stable cardiomediastinal silhouette. No acute osseous abnormality. IMPRESSION: No active disease. Electronically Signed   By: Kathreen Devoid M.D.   On: 12/02/2021 14:21    Procedures Procedures    Medications Ordered in ED Medications  sodium chloride (PF) 0.9 % injection (  Given by Other 12/02/21 1716)  iohexol (OMNIPAQUE) 350 MG/ML injection 75 mL (75 mLs Intravenous Contrast Given 12/02/21 1655)    ED Course/ Medical Decision Making/ A&P                           Medical Decision Making Amount and/or Complexity of Data Reviewed Labs: ordered. Radiology: ordered. ECG/medicine tests: ordered.  Risk Prescription drug management.   81 year old male with a chief complaint of hypertension.  He has many vague  symptoms with this lightheadedness blurred vision frequent belching abdominal discomfort.  We will obtain delta troponin.  Chest x-ray.  D-dimer.  Give a dose of nitro here.  The patient is having upper respiratory symptoms and has been taking medications that could raise his blood pressure though he denies taking them within the past 24 hours.  D-dimer here is elevated.  No anemia and no leukocytosis no change from baseline renal function.  LFTs with very trivial elevation of AST lipase normal.  Will obtain CT angiogram of the chest.  Patient care signed out to Dr. Melina Copa.  Please see his note for further details care in the ED.  The patients results and plan were reviewed and discussed.   Any x-rays performed were independently reviewed by myself.   Differential diagnosis were considered with the presenting HPI.  Medications  sodium chloride (PF) 0.9 % injection (  Given by Other 12/02/21 1716)  iohexol (OMNIPAQUE) 350 MG/ML injection 75 mL (75 mLs Intravenous Contrast Given 12/02/21 1655)    Vitals:   12/02/21 1645 12/02/21 1715 12/02/21 1735 12/02/21 1756  BP: (!) 195/114 (!) 193/91 (!) 152/73 125/62  Pulse: 81 75 81 73  Resp: 18 18 18 18   Temp:  98.2 F (36.8 C)  98.4 F (36.9 C)  TempSrc:  Oral  Oral  SpO2: 91% 97% 94% 96%  Weight:      Height:        Final diagnoses:  Primary hypertension  Malaise           Final Clinical Impression(s) / ED Diagnoses Final diagnoses:  Primary hypertension  Malaise    Rx / DC Orders ED Discharge Orders     None         Deno Etienne,  DO 12/03/21 2836

## 2021-12-02 NOTE — Discharge Instructions (Addendum)
You were seen in the emergency department for elevated blood pressure and some chest congestion.  You had blood work done that were stable from your priors.  You had a CAT scan of your chest that did not show any evidence of blood clot.  Please continue your blood pressure medication and follow-up with your treating providers.  Return to the emergency department if any worsening or concerning symptoms

## 2021-12-02 NOTE — Telephone Encounter (Signed)
The patient came in as a walk into the office today. He stated that he had been at his podiatrist today and his blood pressure was elevated. He stated that they suggested that he go to the ED but he came to the cardiologist office instead.   The patient stated that his blood pressure was 221/110 at the podiatrist and that he felt dizzy, has blurry vision and felt "off". He stated that he does not take any blood pressure medications and that his medication list is not accurate.  He has been advised that he does need to go to the ED due to his elevated blood pressure and symptoms. While in the waiting area, he had a hard time keeping his eyes open, the patient was short of breath but he denied chest pain.  EMS was called. EKG was completed. Blood pressure 190/103 with a heart rate of 84. O2 was 96%.

## 2021-12-02 NOTE — ED Notes (Signed)
Pt in bed, pt denies pain, pt states that he is ready to go home.  Pt verbalized understanding d/c instructions and follow up.  Pt from dpt via wc.

## 2021-12-02 NOTE — ED Triage Notes (Signed)
Pt to er, pt states that he was at the foot doctor and they checked his blood pressure and it was high, states that he went to his heart doctor and he sent him to the er.  Pt denies chest pain, pt denies unilateral weakness.

## 2021-12-10 ENCOUNTER — Ambulatory Visit (INDEPENDENT_AMBULATORY_CARE_PROVIDER_SITE_OTHER): Payer: Medicare Other | Admitting: Podiatry

## 2021-12-10 ENCOUNTER — Encounter: Payer: Self-pay | Admitting: Podiatry

## 2021-12-10 ENCOUNTER — Other Ambulatory Visit: Payer: Self-pay

## 2021-12-10 DIAGNOSIS — L97511 Non-pressure chronic ulcer of other part of right foot limited to breakdown of skin: Secondary | ICD-10-CM | POA: Diagnosis not present

## 2021-12-10 DIAGNOSIS — R7303 Prediabetes: Secondary | ICD-10-CM

## 2021-12-10 DIAGNOSIS — I872 Venous insufficiency (chronic) (peripheral): Secondary | ICD-10-CM

## 2021-12-10 NOTE — Progress Notes (Signed)
Subjective:  Patient ID: [name], male    DOB: Mar 31, 1941,   MRN: [id number]  No chief complaint on file.    81 y.o. male presents for follow-up of right leg wound and has bene in an [place] boot. Doing well. Has been keeping the bandages dry.  Has a history of infection in the bone in that toe on the right but continued to be healed.  Denies any other pedal complaints. Denies n/v/f/c.   Past Medical History:  Diagnosis Date   Allergy    takes Zyrtec daily   Anxiety    takes Ativan daily as needed   Arthritis    Asthma    Back pain    BPH (benign prostatic hyperplasia)    takes doxazosin for   Carpal tunnel syndrome    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   L4, L5 ,S1   Depression    takes Wellbutrin daily   Dyspnea on exertion    with exertion   GERD (gastroesophageal reflux disease)    takes Nexium and Omeprazole daily   History of kidney stones    HTN (hypertension)    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   Hyperlipidemia    Joint pain    Neuromuscular disorder ([place])    Prediabetes    Sleep apnea    uses CPAP nightly   SOB (shortness of breath)    Swallowing difficulty    Swelling of lower extremity    right more than leg leg   Umbilical hernia     Objective:  Physical Exam: Vascular: DP/PT pulses 2/4 bilateral. CFT <3 seconds. Normal hair growth on digits. Edema noted to right lower extremity. Right foot significantly cold.   Skin. No lacerations or abrasions bilateral feet. Xerosis noted to bilateral lower extremities. Nails 1-5 are thickened discolored and elongated with subungual debris.  Healed wound to right hallux.  Mild erythema to digit and lower extremitiy. Bilateral xeriosis noted. Swelling worsened on the left  right still swollen with mild erythema noted. 0.7 cm x 0.3 x 0.1 cm wound over dorsal right leg with drainage present. No erythema or edema noted.  Musculoskeletal: MMT 5/5 bilateral lower extremities in DF, PF, Inversion and  Eversion. Deceased ROM in DF of ankle joint.  Neurological: Sensation intact to light touch.   Assessment:   1. Non-pressure chronic ulcer of other part of right foot limited to breakdown of skin ([place])   2. Venous (peripheral) insufficiency   3. Prediabetes          Plan:  Patient was evaluated and treated and all questions answered. Ulcer right hallux wound healed, right leg wound ,limited to skin X-rays reviewed.  -Debridement of right leg  wound below.  -Right hallux wound heealed leg wound dressed with silvadene and  unna boot on the right.  Compression stocking on the left.  -No abx indicated today.  -Discussed glucose control and proper protein-rich diet.  -Discussed if any worsening redness, pain, fever or chills to call or may need to report to the emergency room. Patient expressed understanding.   Procedure: Excisional Debridement of Wound Rationale: Removal of non-viable soft tissue from the wound to promote healing.  Anesthesia: none Pre-Debridement Wound Measurements: Overylying eschar Post-Debridement Wound Measurements: 0.7 cm x 0.3 cm x 0.1cm right leg wound.  Type of Debridement: Sharp Excisional Tissue Removed: Non-viable soft tissue Depth of Debridement: subcutaneous tissue. Technique: Sharp excisional debridement to bleeding, viable wound base.  Dressing: Dry, sterile, compression dressing.  Disposition: Patient tolerated procedure well. Patient to return in 1 week for follow-up.  Return in about 1 week (around 12/17/2021) for wound check.   Follow-up in one week.   Return in about 1 week (around 12/17/2021) for wound check.   Lorenda Peck, DPM

## 2021-12-10 NOTE — Telephone Encounter (Signed)
AZ&Me approval and shipment notification received via fax.

## 2021-12-17 ENCOUNTER — Ambulatory Visit: Payer: Medicare Other | Admitting: Podiatry

## 2021-12-18 ENCOUNTER — Ambulatory Visit (INDEPENDENT_AMBULATORY_CARE_PROVIDER_SITE_OTHER): Payer: Medicare Other | Admitting: Podiatry

## 2021-12-18 ENCOUNTER — Other Ambulatory Visit: Payer: Self-pay

## 2021-12-18 DIAGNOSIS — I872 Venous insufficiency (chronic) (peripheral): Secondary | ICD-10-CM | POA: Diagnosis not present

## 2021-12-18 DIAGNOSIS — G5602 Carpal tunnel syndrome, left upper limb: Secondary | ICD-10-CM | POA: Diagnosis not present

## 2021-12-18 DIAGNOSIS — R7303 Prediabetes: Secondary | ICD-10-CM

## 2021-12-18 DIAGNOSIS — L97511 Non-pressure chronic ulcer of other part of right foot limited to breakdown of skin: Secondary | ICD-10-CM | POA: Diagnosis not present

## 2021-12-18 DIAGNOSIS — M25532 Pain in left wrist: Secondary | ICD-10-CM | POA: Diagnosis not present

## 2021-12-18 NOTE — Progress Notes (Signed)
Subjective:  Patient ID: [name], male    DOB: 1940-12-02,   MRN: [id number]  Chief Complaint  Patient presents with   Wound Check    Right leg wound check      81 y.o. male presents for follow-up of right leg wound and has bene in an [place] boot. Doing well. Has been keeping the bandages dry.  Has a history of infection in the bone in that toe on the right but continued to be healed.  Denies any other pedal complaints. Denies n/v/f/c.   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 CFT <3 seconds. Normal hair growth on digits. Edema noted to right lower extremity. Right foot significantly cold.   Skin. No lacerations or abrasions bilateral feet. Xerosis noted to bilateral lower extremities. Nails 1-5 are thickened discolored and elongated with subungual debris.  Healed wound to right hallux.  Mild erythema to digit and lower extremitiy. Bilateral xeriosis noted. Swelling worsened on the left  right still swollen with mild erythema noted. 0.7 cm x 0.3 x 0.1 cm wound over dorsal right leg with drainage present. No erythema or edema noted.  Musculoskeletal: MMT 5/5  bilateral lower extremities in DF, PF, Inversion and Eversion. Deceased ROM in DF of ankle joint.  Neurological: Sensation intact to light touch.   Assessment:   1. Non-pressure chronic ulcer of other part of right foot limited to breakdown of skin ([place])   2. Venous (peripheral) insufficiency   3. Prediabetes           Plan:  Patient was evaluated and treated and all questions answered. Ulcer right hallux wound healed, right leg wound ,limited to skin X-rays reviewed.  -Debridement of right leg  wound below.  -Right hallux wound heealed leg wound dressed with silvadene and compression stocking. -Discussed referral to wound care as this wound has been present for over 6 weeks and not improved.   Compression stocking on the left.  -No abx indicated today.  -Discussed glucose control and proper protein-rich diet.  -Discussed if any worsening redness, pain, fever or chills to call or may need to report to the emergency room. Patient expressed understanding.   Procedure: Excisional Debridement of Wound Rationale: Removal of non-viable soft tissue from the wound to promote healing.  Anesthesia: none Pre-Debridement Wound Measurements: Overylying eschar Post-Debridement Wound Measurements: 0.7 cm x 0.3 cm x 0.1cm right leg wound.  Type of Debridement: Sharp Excisional Tissue Removed: Non-viable soft tissue Depth of Debridement: subcutaneous tissue. Technique: Sharp excisional debridement to bleeding, viable wound base.  Dressing: Dry, sterile, compression dressing. Disposition: Patient tolerated procedure well. Patient to return in 2 week for follow-up.  No follow-ups on file.   Follow-up in one week.   No follow-ups on file.   Lorenda Peck, DPM

## 2022-01-01 ENCOUNTER — Encounter: Payer: Self-pay | Admitting: Podiatry

## 2022-01-06 ENCOUNTER — Ambulatory Visit: Payer: Medicare Other | Admitting: Podiatry

## 2022-01-23 ENCOUNTER — Telehealth: Payer: Self-pay | Admitting: Pulmonary Disease

## 2022-01-23 NOTE — Telephone Encounter (Signed)
Called and spoke to pt. Pt c/o head congestion, weakness, intermittent dizziness, mild ShOB, prod cough with white, yellow and green mucus x 5 days. Pt states since yesterday he has noticed small pea size "globs of bright red blood" mixed in with mucus when he coughs. Pt states this has happened around 5-6 times. Pt denies f/c/s, CP/tightness. Pt last seen in 05/2021 by Dr. Vaughan Browner for asthma. Pt states he is unsure if the mucus he is coughing up is PND from sinus or mucus from chest. Pt sounded very nasally when speaking due to sinus congestion. Appt scheduled for 3/17 at 1130 with Roxan Diesel, NP. Advised pt to seek emergency care with ANY new or worsening s/s, pt understood agreed to this.  ? ?Will forward to Joellen Jersey as FYI for appt tomorrow.  ?

## 2022-01-24 ENCOUNTER — Encounter: Payer: Self-pay | Admitting: Nurse Practitioner

## 2022-01-24 ENCOUNTER — Other Ambulatory Visit: Payer: Self-pay

## 2022-01-24 ENCOUNTER — Ambulatory Visit (INDEPENDENT_AMBULATORY_CARE_PROVIDER_SITE_OTHER): Payer: Medicare Other

## 2022-01-24 ENCOUNTER — Ambulatory Visit (INDEPENDENT_AMBULATORY_CARE_PROVIDER_SITE_OTHER): Payer: Medicare Other | Admitting: Nurse Practitioner

## 2022-01-24 VITALS — BP 120/78 | HR 86 | Temp 97.9°F | Ht 68.5 in | Wt 249.6 lb

## 2022-01-24 DIAGNOSIS — R051 Acute cough: Secondary | ICD-10-CM

## 2022-01-24 DIAGNOSIS — K219 Gastro-esophageal reflux disease without esophagitis: Secondary | ICD-10-CM | POA: Diagnosis not present

## 2022-01-24 DIAGNOSIS — J069 Acute upper respiratory infection, unspecified: Secondary | ICD-10-CM

## 2022-01-24 DIAGNOSIS — J019 Acute sinusitis, unspecified: Secondary | ICD-10-CM

## 2022-01-24 DIAGNOSIS — R059 Cough, unspecified: Secondary | ICD-10-CM | POA: Diagnosis not present

## 2022-01-24 DIAGNOSIS — I10 Essential (primary) hypertension: Secondary | ICD-10-CM | POA: Diagnosis not present

## 2022-01-24 DIAGNOSIS — R609 Edema, unspecified: Secondary | ICD-10-CM | POA: Diagnosis not present

## 2022-01-24 DIAGNOSIS — J4531 Mild persistent asthma with (acute) exacerbation: Secondary | ICD-10-CM

## 2022-01-24 LAB — NITRIC OXIDE: Nitric Oxide: 25

## 2022-01-24 LAB — POC INFLUENZA A&B (BINAX/QUICKVUE)
Influenza A, POC: NEGATIVE
Influenza B, POC: NEGATIVE

## 2022-01-24 LAB — POC COVID19 BINAXNOW: SARS Coronavirus 2 Ag: NEGATIVE

## 2022-01-24 MED ORDER — PREDNISONE 20 MG PO TABS: 40.0000 mg | ORAL_TABLET | Freq: Every day | ORAL | 0 refills | Status: AC

## 2022-01-24 MED ORDER — MONTELUKAST SODIUM 10 MG PO TABS: 10.0000 mg | ORAL_TABLET | Freq: Every day | ORAL | 5 refills | Status: DC

## 2022-01-24 MED ORDER — BENZONATATE 200 MG PO CAPS: 200.0000 mg | ORAL_CAPSULE | Freq: Three times a day (TID) | ORAL | 1 refills | Status: DC | PRN

## 2022-01-24 MED ORDER — AZELASTINE HCL 0.1 % NA SOLN: 2.0000 | Freq: Two times a day (BID) | NASAL | 3 refills | Status: DC

## 2022-01-24 MED ORDER — DOXYCYCLINE HYCLATE 100 MG PO TABS
100.0000 mg | ORAL_TABLET | Freq: Two times a day (BID) | ORAL | 0 refills | Status: AC
Start: 2022-01-24 — End: 2022-01-31

## 2022-01-24 NOTE — Assessment & Plan Note (Signed)
Recent hypertensive urgency requiring ED eval. Stabilized and discharged home. BP well-controlled today. Advised to follow up with cardiology as scheduled and monitor BP at home. ?

## 2022-01-24 NOTE — Patient Instructions (Addendum)
Continue Albuterol inhaler 2 puffs or 3 mL neb every 6 hours as needed for shortness of breath or wheezing. Notify if symptoms persist despite rescue inhaler/neb use. ?Continue Symbicort 2 puffs Twice daily. Brush tongue and rinse mouth afterwards  ?Continue nexium 40 mg daily  ?Continue flonase nasal spray 2 sprays each nostril daily  ?Continue lasix 40 mg daily as prescribed by cardiology ? ?-Mucinex DM Twice daily for cough/congestion ?-Saline nasal spray 2-3 times a day for nasal congestion ?-Doxycyline 100 mg Twice daily for 7 days. Take with food. Avoid direct sun exposure and wear sunscreen while taking  ?-Prednisone 40 mg for 5 days. Take in AM with food. ?-Singulair 10 mg At bedtime  ?-Astelin nasal spray 2 sprays each nostril Twice daily until nasal congestion improves  ?-Tessalon perles 1 capsule every 8 hours as needed for cough ? ?Seek emergency care if you start coughing up more blood.  ? ?Follow up in 2 weeks with Dr. Vaughan Browner or Alanson Aly. If symptoms do not improve or worsen, please contact office for sooner follow up or seek emergency care. ?

## 2022-01-24 NOTE — Progress Notes (Signed)
? ?'@Patient'$  ID: [name], male    DOB: September 28, 1941, 81 y.o.   MRN: [id number] ? ?Chief Complaint  ?Patient presents with  ? Follow-up  ?  He is having some cough, short of breath and wheezing, x few days. Has some streaky blood in sputum.  ? ? ?Referring provider: ?Cari Caraway, MD ? ?HPI: ?81 year old male, never smoker followed for mild intermittent asthma and DOE.  He is a patient Dr. Matilde Bash and last seen in office on 06/04/2021.  Past medical history significant for venous insufficiency, hypertension, allergic rhinitis, tubular adenoma of colon, chronic GERD, hypogonadism, chronic fatigue, DJD, BPH, CKD stage III, HLD, GAD, MDD, primary insomnia, history of sleep apnea. ? ?TEST/EVENTS:  ? ?06/04/2021: OV with Dr. Vaughan Browner.  Originally referred for evaluation of dyspnea with ongoing symptoms x2 years.  Associated with wheezing chest tightness.  He was on albuterol inhaler and previously tried Advair but felt it did not make a difference.  He was changed to Symbicort and at one point was on [place] but felt like Symbicort worked better for him.  Continued to have dyspnea upon exertion.  Cardiac work-up with normal EF and BMP.  Chest x-ray showed mild interstitial changes.  Plan for HRCT for better evaluation.  Does have mold exposure at home but no evidence of hypersensitivity pneumonitis on most recent imaging or labs.  Continued Symbicort twice daily.  Advised to initiate healthy weight management.  Continued on Nexium for GERD.  Compliant on CPAP. ? ?01/24/2022: Today-acute visit ?Patient presents today for acute visit.  He contacted the office yesterday with reports of head congestion, weakness, intermittent dizziness, mild shortness of breath, productive cough with white-yellow and green mucus.  He also noted a small pea-sized amount of bright red blood mixed in with his mucus when he coughs.  Reported that it happened around 5 or 6 times.  He denied any chest tightness or pain, fevers or chills.  No  recent sick exposures.  Today, he reports feeling relatively the same.  The blood in his sputum is streaky and continues to be yellow.  Does have persistent nasal congestion and occasional drainage that is white to yellow.  Does note an occasional wheeze as well.  Denies any other associated symptoms.  Continues on Symbicort twice daily.  Using albuterol occasionally.  He is currently on Flonase and has used Mucinex a time or 2. ? ?No Known Allergies ? ?Immunization History  ?Administered Date(s) Administered  ? Hepatitis A, Adult 07/31/2016  ? Influenza Split 09/28/2012, 09/10/2020  ? Influenza,inj,Quad PF,6+ Mos 08/03/2013, 07/21/2014, 09/13/2015, 07/23/2016, 07/14/2019  ? Moderna Sars-Covid-2 Vaccination 11/21/2019, 12/19/2019, 08/24/2020  ? Pneumococcal Conjugate-13 03/25/2015  ? Pneumococcal Polysaccharide-23 04/29/2011, 06/28/2016  ? Tdap 04/25/2013  ? ? ?Past Medical History:  ?Diagnosis Date  ? Allergy   ? takes Zyrtec daily  ? Anxiety   ? takes Ativan daily as needed  ? Arthritis   ? Asthma   ? Back pain   ? BPH (benign prostatic hyperplasia)   ? takes doxazosin for  ? Carpal tunnel syndrome   ? 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 L4, L5 ,S1  ? Depression   ? takes Wellbutrin daily  ? Dyspnea on exertion   ? with exertion  ? GERD (gastroesophageal reflux disease)   ? takes Nexium and Omeprazole daily  ? History of kidney stones   ? HTN (hypertension)   ? 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 Hyperlipidemia   ? Joint pain   ?  Neuromuscular disorder ([place])   ? Prediabetes   ? Sleep apnea   ? uses CPAP nightly  ? SOB (shortness of breath)   ? Swallowing difficulty   ? Swelling of lower extremity   ? right more than leg leg  ? Umbilical hernia   ? ? ?Tobacco History: ?Social History  ? ?Tobacco Use  ?Smoking Status Never  ?Smokeless Tobacco Never  ? ?Counseling given: Not Answered ? ? ?Outpatient Medications Prior to Visit  ?Medication Sig Dispense Refill  ? Albuterol Sulfate (PROAIR RESPICLICK) 810  (90 Base) MCG/ACT AEPB Inhale 2 puffs into the lungs every 6 (six) hours as needed. (Patient taking differently: Inhale 2 puffs into the lungs every 6 (six) hours as needed (for shortness of breath).) 1 each 3  ? B-D 3CC LUER-LOK SYR 21GX1-1/2 21G X 1-1/2" 3 ML MISC USE TO INJECT DEPOTESTOSTERONE. 20 each 0  ? budesonide-formoterol (SYMBICORT) 160-4.5 MCG/ACT inhaler Inhale 2 puffs into the lungs 2 (two) times daily. 1 each 11  ? buPROPion (WELLBUTRIN XL) 300 MG 24 hr tablet TAKE 1 TABLET BY MOUTH DAILY *NEEDS OFFICE VISIT (Patient taking differently: Take 300 mg by mouth daily.) 30 tablet 11  ? calcium carbonate 200 MG capsule Take 200 mg by mouth daily.     ? doxazosin (CARDURA) 8 MG tablet TAKE 1 TABLET BY MOUTH DAILY  . Take at night (Patient taking differently: Take 8 mg by mouth every evening.) 30 tablet 11  ? esomeprazole (NEXIUM) 40 MG capsule Take 1 capsule (40 mg total) by mouth 2 (two) times daily before a meal. (Patient taking differently: Take 40 mg by mouth daily before breakfast.) 60 capsule 0  ? etodolac (LODINE) 400 MG tablet Take 1 tablet (400 mg total) by mouth 2 (two) times daily as needed. (Patient taking differently: Take 400 mg by mouth See admin instructions. Take 400 mg by mouth in the evening when not taking Meloxicam) 180 tablet 1  ? flurazepam (DALMANE) 15 MG capsule Take by mouth.    ? fluticasone (FLONASE) 50 MCG/ACT nasal spray Place 1 spray into both nostrils daily. (Patient taking differently: Place 1 spray into both nostrils in the morning and at bedtime.) 16 g 2  ? furosemide (LASIX) 20 MG tablet TAKE 2 TABLETS BY MOUTH ONCE A DAY 60 tablet 0  ? furosemide (LASIX) 40 MG tablet Take 40 mg by mouth daily.    ? gabapentin (NEURONTIN) 300 MG capsule TAKE 1 TO 2 CAPSULES BY MOUTH AT BEDTIME (Patient taking differently: Take 600 mg by mouth at bedtime.) 60 capsule 0  ? glucosamine-chondroitin 500-400 MG tablet Take 1 tablet by mouth 2 (two) times daily.    ? guaiFENesin (MUCINEX) 600 MG  12 hr tablet Take 1 tablet (600 mg total) by mouth 2 (two) times daily as needed. (Patient taking differently: Take 600 mg by mouth 2 (two) times daily as needed for cough or to loosen phlegm.) 30 tablet 1  ? HYDROcodone-acetaminophen (NORCO) 5-325 MG tablet 1 po q d prn pain 45 tablet 0  ? LORazepam (ATIVAN) 1 MG tablet Take 0.5-1 mg by mouth at bedtime.    ? meloxicam (MOBIC) 15 MG tablet Take 15 mg by mouth every evening.    ? methocarbamol (ROBAXIN) 500 MG tablet Take 1 tablet (500 mg total) by mouth every 8 (eight) hours as needed for muscle spasms. (Patient taking differently: Take 500 mg by mouth in the morning and at bedtime.) 20 tablet 0  ? mirtazapine (REMERON) 15 MG tablet  Take 15 mg by mouth at bedtime.    ? Multiple Vitamins-Minerals (CENTRUM SILVER) tablet Take 1 tablet by mouth daily.    ? NEEDLE, DISP, 21 G 21G X 1-1/2" MISC Use to inject depotestosterone. 50 each 0  ? potassium chloride SA (K-DUR,KLOR-CON) 20 MEQ tablet Take 1 tablet (20 mEq total) by mouth 2 (two) times daily. 18 tablet 3  ? rosuvastatin (CRESTOR) 5 MG tablet Take 5 mg by mouth daily.    ? sildenafil (REVATIO) 20 MG tablet Take by mouth.    ? Syringe, Reusable, 3 ML MISC Use to inject depotestosterone. 1 each 0  ? testosterone cypionate (DEPOTESTOSTERONE CYPIONATE) 200 MG/ML injection every Saturday.    ? Budeson-Glycopyrrol-Formoterol (BREZTRI AEROSPHERE) 160-9-4.8 MCG/ACT AERO Inhale 2 puffs into the lungs in the morning and at bedtime. (Patient not taking: Reported on 01/24/2022) 5.9 g 0  ? doxycycline (VIBRA-TABS) 100 MG tablet Take 1 tablet (100 mg total) by mouth 2 (two) times daily. (Patient not taking: Reported on 01/24/2022) 20 tablet 0  ? ?No facility-administered medications prior to visit.  ? ? ? ?Review of Systems:  ? ?Constitutional: No weight loss or gain, night sweats, fevers, chills. +fatigue ?HEENT: No difficulty swallowing, tooth/dental problems, or sore throat. No sneezing, itching, ear ache. +nasal  congestion/drainage, occasional headaches/sinus pressure ?CV:  No chest pain, orthopnea, PND, swelling in lower extremities, anasarca, dizziness, palpitations, syncope ?Resp: +shortness of breath with exertion; product

## 2022-01-24 NOTE — Assessment & Plan Note (Addendum)
COVID and flu negative. Doxy 7 day course and supportive care measures. Continue flonase and add astelin nasal spray. See above plan. Suspect blood tinged mucus related to irritation from persistent coughing. [place] low probability for PE and no mass hemoptysis. Advised to seek emergency care if worsening or no improvement over the weekend. Close follow up. ?

## 2022-01-24 NOTE — Assessment & Plan Note (Signed)
Currently on daily lasix. Stable without any increased edema.  ?

## 2022-01-24 NOTE — Assessment & Plan Note (Addendum)
Mild asthma exacerbation likely related to URI. FeNO borderline at 25 ppb. Evidence of bronchospasm on exam. Prednisone burst. Continue Symbicort. Initiate therapy with singulair for recent eos 0.2. Discussed potential side effects - verbalized understanding. CXR without evidence of superimposed infection. Doxy course for lower respiratory infection and acute rhinosinusitis.  ? ?Patient Instructions  ?Continue Albuterol inhaler 2 puffs or 3 mL neb every 6 hours as needed for shortness of breath or wheezing. Notify if symptoms persist despite rescue inhaler/neb use. ?Continue Symbicort 2 puffs Twice daily. Brush tongue and rinse mouth afterwards  ?Continue nexium 40 mg daily  ?Continue flonase nasal spray 2 sprays each nostril daily  ?Continue lasix 40 mg daily as prescribed by cardiology ? ?-Mucinex DM Twice daily for cough/congestion ?-Saline nasal spray 2-3 times a day for nasal congestion ?-Doxycyline 100 mg Twice daily for 7 days. Take with food. Avoid direct sun exposure and wear sunscreen while taking  ?-Prednisone 40 mg for 5 days. Take in AM with food. ?-Singulair 10 mg At bedtime  ?-Astelin nasal spray 2 sprays each nostril Twice daily until nasal congestion improves  ?-Tessalon perles 1 capsule every 8 hours as needed for cough ? ?Seek emergency care if you start coughing up more blood.  ? ?Follow up in 2 weeks with Dr. Vaughan Browner or Alanson Aly. If symptoms do not improve or worsen, please contact office for sooner follow up or seek emergency care. ? ?  ?

## 2022-01-24 NOTE — Assessment & Plan Note (Signed)
Well-controlled on Nexium. Continue PPI therapy ?

## 2022-02-03 ENCOUNTER — Ambulatory Visit (INDEPENDENT_AMBULATORY_CARE_PROVIDER_SITE_OTHER): Payer: Medicare Other | Admitting: Nurse Practitioner

## 2022-02-03 ENCOUNTER — Encounter: Payer: Self-pay | Admitting: Nurse Practitioner

## 2022-02-03 ENCOUNTER — Other Ambulatory Visit: Payer: Self-pay

## 2022-02-03 VITALS — BP 126/60 | HR 78 | Temp 98.0°F | Ht 68.5 in | Wt 252.0 lb

## 2022-02-03 DIAGNOSIS — R058 Other specified cough: Secondary | ICD-10-CM | POA: Diagnosis not present

## 2022-02-03 DIAGNOSIS — J302 Other seasonal allergic rhinitis: Secondary | ICD-10-CM | POA: Diagnosis not present

## 2022-02-03 DIAGNOSIS — I10 Essential (primary) hypertension: Secondary | ICD-10-CM

## 2022-02-03 DIAGNOSIS — K219 Gastro-esophageal reflux disease without esophagitis: Secondary | ICD-10-CM

## 2022-02-03 DIAGNOSIS — J4531 Mild persistent asthma with (acute) exacerbation: Secondary | ICD-10-CM

## 2022-02-03 MED ORDER — PREDNISONE 10 MG PO TABS: ORAL_TABLET | ORAL | 0 refills | Status: DC

## 2022-02-03 MED ORDER — BREZTRI AEROSPHERE 160-9-4.8 MCG/ACT IN AERO: 2.0000 | INHALATION_SPRAY | Freq: Two times a day (BID) | RESPIRATORY_TRACT | 0 refills | Status: DC

## 2022-02-03 MED ORDER — ALBUTEROL SULFATE HFA 108 (90 BASE) MCG/ACT IN AERS: 2.0000 | INHALATION_SPRAY | Freq: Four times a day (QID) | RESPIRATORY_TRACT | 2 refills | Status: DC | PRN

## 2022-02-03 NOTE — Assessment & Plan Note (Signed)
Mild asthma exacerbation. Initially improved with short prednisone burst. Will extend with slow prednisone taper. Evidence of bronchospasm on exam. Trial step up to triple therapy. Continue singulair for recent eos 0.2. Continue trigger prevention and cough control.  ? ?Patient Instructions  ?-Stop Symbicort. Trial Breztri 2 puffs Twice daily. Brush tongue and rinse mouth afterwards. If you do not notice a difference in your breathing, you can return to Symbicort  ?-Continue Albuterol inhaler 2 puffs or 3 mL neb every 6 hours as needed for shortness of breath or wheezing. Notify if symptoms persist despite rescue inhaler/neb use. ?-Continue nexium 40 mg daily  ?-Continue flonase nasal spray 2 sprays each nostril daily  ?-Continue lasix 40 mg daily as prescribed by cardiology ?-Continue Saline nasal spray 2-3 times a day for nasal congestion ?-Continue Singulair 10 mg At bedtime  ?-Continue Astelin nasal spray 2 sprays each nostril Twice daily until nasal congestion improves  ?-Continue tessalon perles 1 capsule every 8 hours as needed for cough ? ?-Mucinex DM Twice daily for cough/congestion ?-Prednisone taper. 4 tabs for 3 days, then 3 tabs for 3 days, 2 tabs for 3 days, then 1 tab for 3 days, then stop. Take in AM with food  ? ?Follow up in 4-6 weeks with Dr. Vaughan Browner. If symptoms do not improve or worsen, please contact office for sooner follow up or seek emergency care. ? ? ?

## 2022-02-03 NOTE — Assessment & Plan Note (Addendum)
Improved. Continue trigger prevention. Mucolytic therapy advised.  ?

## 2022-02-03 NOTE — Assessment & Plan Note (Signed)
Suspect there is a component of upper airway irritation as well. Continue postnasal drainage and cough control measures.  ?

## 2022-02-03 NOTE — Patient Instructions (Addendum)
-  Stop Symbicort. Trial Breztri 2 puffs Twice daily. Brush tongue and rinse mouth afterwards. If you do not notice a difference in your breathing, you can return to Symbicort  ?-Continue Albuterol inhaler 2 puffs or 3 mL neb every 6 hours as needed for shortness of breath or wheezing. Notify if symptoms persist despite rescue inhaler/neb use. ?-Continue nexium 40 mg daily  ?-Continue flonase nasal spray 2 sprays each nostril daily  ?-Continue lasix 40 mg daily as prescribed by cardiology ?-Continue Saline nasal spray 2-3 times a day for nasal congestion ?-Continue Singulair 10 mg At bedtime  ?-Continue Astelin nasal spray 2 sprays each nostril Twice daily until nasal congestion improves  ?-Continue tessalon perles 1 capsule every 8 hours as needed for cough ? ?-Mucinex DM Twice daily for cough/congestion ?-Prednisone taper. 4 tabs for 3 days, then 3 tabs for 3 days, 2 tabs for 3 days, then 1 tab for 3 days, then stop. Take in AM with food  ? ?Follow up in 4-6 weeks with Dr. Vaughan Browner. If symptoms do not improve or worsen, please contact office for sooner follow up or seek emergency care. ?

## 2022-02-03 NOTE — Assessment & Plan Note (Signed)
Well-controlled on PPI. Continue current regimen with Nexium. ?

## 2022-02-03 NOTE — Progress Notes (Signed)
? ?'@Patient'$  ID: [name], male    DOB: 10-27-1941, 81 y.o.   MRN: [id number] ? ?Chief Complaint  ?Patient presents with  ? Follow-up  ?  Cough   ? ? ?Referring provider: ?Cari Caraway, MD ? ?HPI: ?81 year old male, never smoker followed for mild intermittent asthma and DOE.  He is a patient of Dr. Matilde Bash and last seen in office on 01/24/2022 by [facility] NP.  Past medical history significant for venous insufficiency, hypertension, allergic rhinitis, tubular adenoma of colon, chronic GERD, hypogonadism, chronic fatigue, DJD, BPH, CKD stage III, HLD, GAD, MDD, primary insomnia, history of sleep apnea. ? ?TEST/EVENTS:  ?11/25/2019 PFTs: FVC 3.21 (82), FEV1 2.61 (93), ratio 81, TLC 87, DLCOunc 90%. No significant BD ?12/02/2021 CTA chest: Lungs clear.  No suspicious pulmonary nodules.  No evidence of PE.  Atherosclerosis was present. ? ?06/04/2021: OV with Dr. Vaughan Browner.  Originally referred for evaluation of dyspnea with ongoing symptoms x2 years.  Associated with wheezing chest tightness.  He was on albuterol inhaler and previously tried Advair but felt it did not make a difference.  He was changed to Symbicort and at one point was on [place] but felt like Symbicort worked better for him.  Continued to have dyspnea upon exertion.  Cardiac work-up with normal EF and BMP.  Chest x-ray showed mild interstitial changes.  Plan for HRCT for better evaluation.  Does have mold exposure at home but no evidence of hypersensitivity pneumonitis on most recent imaging or labs.  Continued Symbicort twice daily.  Advised to initiate healthy weight management.  Continued on Nexium for GERD.  Compliant on CPAP ? ?01/24/2022: OV with Jennilyn Esteve NP. Asthma exacerbation - tx with prednisone burst and doxy course. CXR clear. Reports of minimal amount of blood tinged sputum - pink. Advised to monitor and seek emergency care if this worsens. Trigger prevention with singulair. Postnasal drainage and cough control therapies. COVID and flu were  negative.  ? ?02/03/2022: Today - follow up ?Patient presents today for follow up visit. He reports feeling better but his cough has slowly returned. It had mostly resolved after his prednisone and doxy course but has since returned. His cough is dry. He has not had anymore episodes of hemoptysis. His nasal congestion has improved with new regimen of astelin and flonase. He does have some occasional wheezing since coming off the prednisone as well. He denies orthopnea, PND or lower extremity swelling. He continues on Symbicort but feels like he is having some increased shortness of breath and that it isn't work as well anymore. He does not have a rescue inhaler.  ? ?No Known Allergies ? ?Immunization History  ?Administered Date(s) Administered  ? Hepatitis A, Adult 07/31/2016  ? Influenza Split 09/28/2012, 09/10/2020, 08/12/2021  ? Influenza,inj,Quad PF,6+ Mos 08/03/2013, 07/21/2014, 09/13/2015, 07/23/2016, 07/14/2019  ? Moderna Covid-19 Vaccine Bivalent Booster 25yr & up 09/03/2021  ? Moderna Sars-Covid-2 Vaccination 11/21/2019, 12/19/2019, 08/24/2020, 05/04/2021  ? Pneumococcal Conjugate-13 03/25/2015  ? Pneumococcal Polysaccharide-23 04/29/2011, 06/28/2016  ? Tdap 04/25/2013  ? ? ?Past Medical History:  ?Diagnosis Date  ? Allergy   ? takes Zyrtec daily  ? Anxiety   ? takes Ativan daily as needed  ? Arthritis   ? Asthma   ? Back pain   ? BPH (benign prostatic hyperplasia)   ? takes doxazosin for  ? Carpal tunnel syndrome   ? 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 L4, L5 ,S1  ? Depression   ? takes Wellbutrin daily  ? Dyspnea on  exertion   ? with exertion  ? GERD (gastroesophageal reflux disease)   ? takes Nexium and Omeprazole daily  ? History of kidney stones   ? HTN (hypertension)   ? Hx of [facility] spotted fever childhood  ? Hyperlipidemia   ? Joint pain   ? Neuromuscular disorder ([place])   ? Prediabetes   ? Sleep apnea   ? uses CPAP nightly  ? SOB (shortness of breath)   ? Swallowing difficulty   ? Swelling  of lower extremity   ? right more than leg leg  ? Umbilical hernia   ? ? ?Tobacco History: ?Social History  ? ?Tobacco Use  ?Smoking Status Never  ?Smokeless Tobacco Never  ? ?Counseling given: Not Answered ? ? ?Outpatient Medications Prior to Visit  ?Medication Sig Dispense Refill  ? B-D 3CC LUER-LOK SYR 21GX1-1/2 21G X 1-1/2" 3 ML MISC USE TO INJECT DEPOTESTOSTERONE. 20 each 0  ? benzonatate (TESSALON) 200 MG capsule Take 1 capsule (200 mg total) by mouth 3 (three) times daily as needed for cough. 30 capsule 1  ? budesonide-formoterol (SYMBICORT) 160-4.5 MCG/ACT inhaler Inhale 2 puffs into the lungs 2 (two) times daily. 1 each 11  ? buPROPion (WELLBUTRIN XL) 300 MG 24 hr tablet TAKE 1 TABLET BY MOUTH DAILY *NEEDS OFFICE VISIT (Patient taking differently: Take 300 mg by mouth daily.) 30 tablet 11  ? calcium carbonate 200 MG capsule Take 200 mg by mouth daily.     ? doxazosin (CARDURA) 8 MG tablet TAKE 1 TABLET BY MOUTH DAILY  . Take at night (Patient taking differently: Take 8 mg by mouth every evening.) 30 tablet 11  ? esomeprazole (NEXIUM) 40 MG capsule Take 1 capsule (40 mg total) by mouth 2 (two) times daily before a meal. (Patient taking differently: Take 40 mg by mouth daily before breakfast.) 60 capsule 0  ? etodolac (LODINE) 400 MG tablet Take 1 tablet (400 mg total) by mouth 2 (two) times daily as needed. (Patient taking differently: Take 400 mg by mouth See admin instructions. Take 400 mg by mouth in the evening when not taking Meloxicam) 180 tablet 1  ? flurazepam (DALMANE) 15 MG capsule Take by mouth.    ? fluticasone (FLONASE) 50 MCG/ACT nasal spray Place 1 spray into both nostrils daily. (Patient taking differently: Place 1 spray into both nostrils in the morning and at bedtime.) 16 g 2  ? furosemide (LASIX) 20 MG tablet TAKE 2 TABLETS BY MOUTH ONCE A DAY 60 tablet 0  ? furosemide (LASIX) 40 MG tablet Take 40 mg by mouth daily.    ? gabapentin (NEURONTIN) 300 MG capsule TAKE 1 TO 2 CAPSULES BY MOUTH  AT BEDTIME (Patient taking differently: Take 600 mg by mouth at bedtime.) 60 capsule 0  ? glucosamine-chondroitin 500-400 MG tablet Take 1 tablet by mouth 2 (two) times daily.    ? guaiFENesin (MUCINEX) 600 MG 12 hr tablet Take 1 tablet (600 mg total) by mouth 2 (two) times daily as needed. (Patient taking differently: Take 600 mg by mouth 2 (two) times daily as needed for cough or to loosen phlegm.) 30 tablet 1  ? HYDROcodone-acetaminophen (NORCO) 5-325 MG tablet 1 po q d prn pain 45 tablet 0  ? LORazepam (ATIVAN) 1 MG tablet Take 0.5-1 mg by mouth at bedtime.    ? meloxicam (MOBIC) 15 MG tablet Take 15 mg by mouth every evening.    ? methocarbamol (ROBAXIN) 500 MG tablet Take 1 tablet (500 mg total) by mouth  every 8 (eight) hours as needed for muscle spasms. (Patient taking differently: Take 500 mg by mouth in the morning and at bedtime.) 20 tablet 0  ? mirtazapine (REMERON) 15 MG tablet Take 15 mg by mouth at bedtime.    ? montelukast (SINGULAIR) 10 MG tablet Take 1 tablet (10 mg total) by mouth at bedtime. 30 tablet 5  ? Multiple Vitamins-Minerals (CENTRUM SILVER) tablet Take 1 tablet by mouth daily.    ? NEEDLE, DISP, 21 G 21G X 1-1/2" MISC Use to inject depotestosterone. 50 each 0  ? potassium chloride SA (K-DUR,KLOR-CON) 20 MEQ tablet Take 1 tablet (20 mEq total) by mouth 2 (two) times daily. 18 tablet 3  ? rosuvastatin (CRESTOR) 5 MG tablet Take 5 mg by mouth daily.    ? sildenafil (REVATIO) 20 MG tablet Take by mouth.    ? Syringe, Reusable, 3 ML MISC Use to inject depotestosterone. 1 each 0  ? testosterone cypionate (DEPOTESTOSTERONE CYPIONATE) 200 MG/ML injection every Saturday.    ? Albuterol Sulfate (PROAIR RESPICLICK) 076 (90 Base) MCG/ACT AEPB Inhale 2 puffs into the lungs every 6 (six) hours as needed. (Patient taking differently: Inhale 2 puffs into the lungs every 6 (six) hours as needed (for shortness of breath).) 1 each 3  ? azelastine (ASTELIN) 0.1 % nasal spray Place 2 sprays into both nostrils  2 (two) times daily. Use in each nostril as directed until symptoms improve (Patient not taking: Reported on 02/03/2022) 30 mL 3  ? ?No facility-administered medications prior to visit.  ? ? ? ?Review of Sys

## 2022-02-03 NOTE — Assessment & Plan Note (Signed)
Recent hypertensive urgency requiring ED eval. Stabilized and discharged home. BP well-controlled today. Advised to follow up with cardiology as scheduled and monitor BP at home. ?

## 2022-02-13 ENCOUNTER — Telehealth: Payer: Self-pay | Admitting: Physical Medicine and Rehabilitation

## 2022-02-13 NOTE — Telephone Encounter (Signed)
Pt called for back injection. Please call pt at [phone]. ?

## 2022-02-17 NOTE — Telephone Encounter (Signed)
Pt called and states he would like to repeat Bilateral L4 TF. Worked 75% in 05/2721  ?

## 2022-02-18 ENCOUNTER — Other Ambulatory Visit: Payer: Self-pay | Admitting: Physical Medicine and Rehabilitation

## 2022-02-18 DIAGNOSIS — M5416 Radiculopathy, lumbar region: Secondary | ICD-10-CM

## 2022-02-19 ENCOUNTER — Ambulatory Visit (INDEPENDENT_AMBULATORY_CARE_PROVIDER_SITE_OTHER): Payer: Medicare Other | Admitting: Podiatry

## 2022-02-19 ENCOUNTER — Encounter: Payer: Self-pay | Admitting: Podiatry

## 2022-02-19 DIAGNOSIS — I872 Venous insufficiency (chronic) (peripheral): Secondary | ICD-10-CM

## 2022-02-19 DIAGNOSIS — L97511 Non-pressure chronic ulcer of other part of right foot limited to breakdown of skin: Secondary | ICD-10-CM | POA: Diagnosis not present

## 2022-02-19 DIAGNOSIS — R7303 Prediabetes: Secondary | ICD-10-CM

## 2022-02-19 MED ORDER — DOXYCYCLINE HYCLATE 100 MG PO TABS: 100.0000 mg | ORAL_TABLET | Freq: Two times a day (BID) | ORAL | 0 refills | Status: AC

## 2022-02-19 NOTE — Progress Notes (Signed)
?Subjective:  ?Patient ID: [name], male    DOB: 01-05-1941,   MRN: [id number] ? ?Chief Complaint  ?Patient presents with  ? Follow-up  ? ? ? ?81 y.o. male presents for concern of right great toe wound opening up. Relates he has overall been doing well and wearing compression but noticed some worsening pain in his right great toe. Relates he has trouble getting down there to clean his feet.  Denies any other pedal complaints. Denies n/v/f/c.  ? ?Past Medical History:  ?Diagnosis Date  ? Allergy   ? takes Zyrtec daily  ? Anxiety   ? takes Ativan daily as needed  ? Arthritis   ? Asthma   ? Back pain   ? BPH (benign prostatic hyperplasia)   ? takes doxazosin for  ? Carpal tunnel syndrome   ? 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 L4, L5 ,S1  ? Depression   ? takes Wellbutrin daily  ? Dyspnea on exertion   ? with exertion  ? GERD (gastroesophageal reflux disease)   ? takes Nexium and Omeprazole daily  ? History of kidney stones   ? HTN (hypertension)   ? Hx of [facility] spotted fever childhood  ? Hyperlipidemia   ? Joint pain   ? Neuromuscular disorder ([place])   ? Prediabetes   ? Sleep apnea   ? uses CPAP nightly  ? SOB (shortness of breath)   ? Swallowing difficulty   ? Swelling of lower extremity   ? right more than leg leg  ? Umbilical hernia   ? ? ?Objective:  ?Physical Exam: ?Vascular: DP/PT pulses 2/4 bilateral. CFT <3 seconds. Normal hair growth on digits. Edema noted to right lower extremity. Right foot significantly cold.  Overall swelling appears imrpoved.  ?Skin. No lacerations or abrasions bilateral feet. Xerosis noted to bilateral lower extremities. Nails 1-5 are thickened discolored and elongated with subungual debris. Right hallux with dorsal superficial abrasion at base of lateral toe. Distal hallux with 0.2 cm x 0.2 cm x 0.2 cm wound with granular base. Some erythema surrounding which has been baseline for him.  ?Musculoskeletal: MMT 5/5 bilateral lower extremities in DF, PF, Inversion and  Eversion. Deceased ROM in DF of ankle joint.  ?Neurological: Sensation intact to light touch.  ? ?Assessment:  ? ?1. Ulcer of right foot, limited to breakdown of skin ([place])   ?2. Venous (peripheral) insufficiency   ?3. Prediabetes   ? ? ? ? ? ? ? ? ? ?Plan:  ?Patient was evaluated and treated and all questions answered. ?Ulcer right hallux wound healed, right leg wound , healed.  ?-Debridement of right toe wound.  ?-Dressed with betadine and DSD.    ?Compression stocking on the left and right.  ?-Doxycycline to pharmacy.  ?-Discussed glucose control and proper protein-rich diet.  ?-Discussed if any worsening redness, pain, fever or chills to call or may need to report to the emergency room. Patient expressed understanding. ? ? ?Procedure: Excisional Debridement of Wound ?Rationale: Removal of non-viable soft tissue from the wound to promote healing.  ?Anesthesia: none ?Pre-Debridement Wound Measurements: Overylying callus ?Post-Debridement Wound Measurements: 0.2 cm x 0.2cm x 0.2cm right  toe ?Type of Debridement: Sharp Excisional ?Tissue Removed: Non-viable soft tissue ?Depth of Debridement: subcutaneous tissue. ?Technique: Sharp excisional debridement to bleeding, viable wound base.  ?Dressing: Dry, sterile, compression dressing. ?Disposition: Patient tolerated procedure well. Patient to return in 2 week for follow-up. ? ?Return in about 1 week (around 02/26/2022) for wound check. ? ? ?  Follow-up in one week.  ? ?Return in about 1 week (around 02/26/2022) for wound check. ? ? ?Lorenda Peck, DPM  ? ? ?

## 2022-02-25 ENCOUNTER — Ambulatory Visit: Payer: Medicare Other | Admitting: Podiatry

## 2022-02-25 DIAGNOSIS — L97511 Non-pressure chronic ulcer of other part of right foot limited to breakdown of skin: Secondary | ICD-10-CM | POA: Diagnosis not present

## 2022-02-25 DIAGNOSIS — N401 Enlarged prostate with lower urinary tract symptoms: Secondary | ICD-10-CM | POA: Diagnosis not present

## 2022-02-25 DIAGNOSIS — Z79899 Other long term (current) drug therapy: Secondary | ICD-10-CM | POA: Diagnosis not present

## 2022-02-25 DIAGNOSIS — R0602 Shortness of breath: Secondary | ICD-10-CM | POA: Diagnosis not present

## 2022-02-25 DIAGNOSIS — J4520 Mild intermittent asthma, uncomplicated: Secondary | ICD-10-CM | POA: Diagnosis not present

## 2022-02-25 DIAGNOSIS — F418 Other specified anxiety disorders: Secondary | ICD-10-CM | POA: Diagnosis not present

## 2022-02-25 DIAGNOSIS — E782 Mixed hyperlipidemia: Secondary | ICD-10-CM | POA: Diagnosis not present

## 2022-02-25 DIAGNOSIS — L139 Bullous disorder, unspecified: Secondary | ICD-10-CM | POA: Diagnosis not present

## 2022-02-25 DIAGNOSIS — R7309 Other abnormal glucose: Secondary | ICD-10-CM | POA: Diagnosis not present

## 2022-02-25 DIAGNOSIS — K219 Gastro-esophageal reflux disease without esophagitis: Secondary | ICD-10-CM | POA: Diagnosis not present

## 2022-02-25 DIAGNOSIS — G479 Sleep disorder, unspecified: Secondary | ICD-10-CM | POA: Diagnosis not present

## 2022-02-26 ENCOUNTER — Ambulatory Visit (INDEPENDENT_AMBULATORY_CARE_PROVIDER_SITE_OTHER): Payer: Medicare Other | Admitting: Podiatry

## 2022-02-26 ENCOUNTER — Encounter: Payer: Self-pay | Admitting: Podiatry

## 2022-02-26 DIAGNOSIS — R7303 Prediabetes: Secondary | ICD-10-CM

## 2022-02-26 DIAGNOSIS — L97511 Non-pressure chronic ulcer of other part of right foot limited to breakdown of skin: Secondary | ICD-10-CM | POA: Diagnosis not present

## 2022-02-26 DIAGNOSIS — I872 Venous insufficiency (chronic) (peripheral): Secondary | ICD-10-CM

## 2022-02-26 NOTE — Progress Notes (Addendum)
?Subjective:  ?Patient ID: [name], male    DOB: [birth year],   MRN: [id number] ? ?Chief Complaint  ?Patient presents with  ? Foot Ulcer  ?  Right big toe I am not sure if draining   ? ? ? ?81 y.o. male presents for follow-up of right great toe. Relates he has been doing well but continues to have difficulty making it to appointments.  Relates he has trouble getting down there to clean his feet.  Denies any other pedal complaints. Denies n/v/f/c.  ? ?Past Medical History:  ?Diagnosis Date  ? Allergy   ? takes Zyrtec daily  ? Anxiety   ? takes Ativan daily as needed  ? Arthritis   ? Asthma   ? Back pain   ? BPH (benign prostatic hyperplasia)   ? takes doxazosin for  ? Carpal tunnel syndrome   ? 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 L4, L5 ,S1  ? Depression   ? takes Wellbutrin daily  ? Dyspnea on exertion   ? with exertion  ? GERD (gastroesophageal reflux disease)   ? takes Nexium and Omeprazole daily  ? History of kidney stones   ? HTN (hypertension)   ? Hx of [facility] spotted fever childhood  ? Hyperlipidemia   ? Joint pain   ? Neuromuscular disorder ([place])   ? Prediabetes   ? Sleep apnea   ? uses CPAP nightly  ? SOB (shortness of breath)   ? Swallowing difficulty   ? Swelling of lower extremity   ? right more than leg leg  ? Umbilical hernia   ? ? ?Objective:  ?Physical Exam: ?Vascular: DP/PT pulses 2/4 bilateral. CFT <3 seconds. Normal hair growth on digits. Edema noted to right lower extremity. Right foot significantly cold.  Overall swelling appears imrpoved.  ?Skin. No lacerations or abrasions bilateral feet. Xerosis noted to bilateral lower extremities. Nails 1-5 are thickened discolored and elongated with subungual debris. Right hallux with dorsal superficial abrasion at base of lateral toe. Distal hallux with 0.1 cm x 0.1 cm x 0.1 cm wound with granular base. Some erythema surrounding which has been baseline for him.  ?Musculoskeletal: MMT 5/5 bilateral lower extremities in DF, PF, Inversion and  Eversion. Deceased ROM in DF of ankle joint.  ?Neurological: Sensation intact to light touch.  ? ?Assessment:  ? ?1. Ulcer of right foot, limited to breakdown of skin ([place])   ?2. Venous (peripheral) insufficiency   ?3. Prediabetes   ? ? ? ? ? ? ? ? ? ?Plan:  ?Patient was evaluated and treated and all questions answered. ?Ulcer right hallux wound nearly healed, right leg wound , healed.  ?-Debridement of right toe wound.  ?-Dressed with betadine and DSD.    ?Compression stocking on the left and right.  ?-No abx necessary.  ?-Discussed glucose control and proper protein-rich diet.  ?-Discussed if any worsening redness, pain, fever or chills to call or may need to report to the emergency room. Patient expressed understanding. ? ? ?Procedure: Excisional Debridement of Wound ?Rationale: Removal of non-viable soft tissue from the wound to promote healing.  ?Anesthesia: none ?Pre-Debridement Wound Measurements: Overylying callus ?Post-Debridement Wound Measurements: 0.1 cm x 0.1cm x 0.1cm right  toe ?Type of Debridement: Sharp Excisional ?Tissue Removed: Non-viable soft tissue ?Depth of Debridement: subcutaneous tissue. ?Technique: Sharp excisional debridement to bleeding, viable wound base.  ?Dressing: Dry, sterile, compression dressing. ?Disposition: Patient tolerated procedure well. Patient to return in 2 week for follow-up. ? ?Return in about  2 weeks (around 03/12/2022) for wound check. ? ? ?Follow-up in one week.  ? ?Return in about 2 weeks (around 03/12/2022) for wound check. ? ? ?Lorenda Peck, DPM  ? ? ?

## 2022-02-27 ENCOUNTER — Telehealth: Payer: Self-pay | Admitting: Pulmonary Disease

## 2022-02-28 NOTE — Telephone Encounter (Signed)
Called and spoke with pt and asked if he was given application for pt assistance for the Breztri at last OV when he received the samples and he said that he does not remember receiving one. I provided pt the phone number for [employer] so he could call them and do his portion over the phone as it may be quicker than receiving the application in the mail. Stated to pt that we would fill out our portion and fax to [employer] once signed by [facility] and he verbalized understanding. ? ?Pt asked if we had any samples of Breztri and at this time we are currently out so stated to him to go back to Symbicort to use until he is able to get more Breztri. Nothing further needed. ?

## 2022-03-03 ENCOUNTER — Telehealth: Payer: Self-pay | Admitting: Dermatology

## 2022-03-03 NOTE — Telephone Encounter (Signed)
Patient is calling for a referral appointment from Cari Caraway, M.D.  May 20, 2022 appointment was offered, but patient refused and will call Dr. Baldomero Lamy office to get referral to another practice. ?

## 2022-03-03 NOTE — Telephone Encounter (Signed)
Referring office called for status update, I informed them of above message from front staff. Referral generated in Epic from paper referral all info documented and referral closed. ?

## 2022-03-04 ENCOUNTER — Other Ambulatory Visit: Payer: Self-pay | Admitting: Urology

## 2022-03-06 ENCOUNTER — Ambulatory Visit
Admission: RE | Admit: 2022-03-06 | Discharge: 2022-03-06 | Disposition: A | Discharge status: Home / Self Care | Payer: Medicare Other | Source: Ambulatory Visit | Attending: Family Medicine | Admitting: Family Medicine

## 2022-03-06 ENCOUNTER — Other Ambulatory Visit: Payer: Self-pay | Admitting: Family Medicine

## 2022-03-06 DIAGNOSIS — R0602 Shortness of breath: Secondary | ICD-10-CM

## 2022-03-06 DIAGNOSIS — L538 Other specified erythematous conditions: Secondary | ICD-10-CM | POA: Diagnosis not present

## 2022-03-06 DIAGNOSIS — Z85828 Personal history of other malignant neoplasm of skin: Secondary | ICD-10-CM | POA: Diagnosis not present

## 2022-03-06 DIAGNOSIS — L02416 Cutaneous abscess of left lower limb: Secondary | ICD-10-CM | POA: Diagnosis not present

## 2022-03-06 DIAGNOSIS — L578 Other skin changes due to chronic exposure to nonionizing radiation: Secondary | ICD-10-CM | POA: Diagnosis not present

## 2022-03-06 DIAGNOSIS — Z08 Encounter for follow-up examination after completed treatment for malignant neoplasm: Secondary | ICD-10-CM | POA: Diagnosis not present

## 2022-03-06 DIAGNOSIS — R208 Other disturbances of skin sensation: Secondary | ICD-10-CM | POA: Diagnosis not present

## 2022-03-06 DIAGNOSIS — L02212 Cutaneous abscess of back [any part, except buttock]: Secondary | ICD-10-CM | POA: Diagnosis not present

## 2022-03-11 ENCOUNTER — Other Ambulatory Visit: Payer: Self-pay | Admitting: Urology

## 2022-03-12 ENCOUNTER — Ambulatory Visit (INDEPENDENT_AMBULATORY_CARE_PROVIDER_SITE_OTHER): Payer: Medicare Other | Admitting: Podiatry

## 2022-03-12 ENCOUNTER — Encounter: Payer: Self-pay | Admitting: Podiatry

## 2022-03-12 DIAGNOSIS — L97511 Non-pressure chronic ulcer of other part of right foot limited to breakdown of skin: Secondary | ICD-10-CM

## 2022-03-12 DIAGNOSIS — I872 Venous insufficiency (chronic) (peripheral): Secondary | ICD-10-CM

## 2022-03-12 DIAGNOSIS — R7303 Prediabetes: Secondary | ICD-10-CM | POA: Diagnosis not present

## 2022-03-12 NOTE — Progress Notes (Signed)
?Subjective:  ?Patient ID: [name], male    DOB: 1941/08/27,   MRN: [id number] ? ?No chief complaint on file. ? ? ? ?81 y.o. male presents for follow-up of right great toe. Relates he has been doing well but still having pain in the right great toe and has noticed his swelling has worsened around his leg.   Denies any other pedal complaints. Denies n/v/f/c.  ? ?Past Medical History:  ?Diagnosis Date  ? Allergy   ? takes Zyrtec daily  ? Anxiety   ? takes Ativan daily as needed  ? Arthritis   ? Asthma   ? Back pain   ? BPH (benign prostatic hyperplasia)   ? takes doxazosin for  ? Carpal tunnel syndrome   ? 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 L4, L5 ,S1  ? Depression   ? takes Wellbutrin daily  ? Dyspnea on exertion   ? with exertion  ? GERD (gastroesophageal reflux disease)   ? takes Nexium and Omeprazole daily  ? History of kidney stones   ? HTN (hypertension)   ? Hx of [facility] spotted fever childhood  ? Hyperlipidemia   ? Joint pain   ? Neuromuscular disorder ([place])   ? Prediabetes   ? Sleep apnea   ? uses CPAP nightly  ? SOB (shortness of breath)   ? Swallowing difficulty   ? Swelling of lower extremity   ? right more than leg leg  ? Umbilical hernia   ? ? ?Objective:  ?Physical Exam: ?Vascular: DP/PT pulses 2/4 bilateral. CFT <3 seconds. Normal hair growth on digits. Edema noted to right lower extremity. Right foot significantly cold.  Overall swelling appears slightly worse.  ?Skin. No lacerations or abrasions bilateral feet. Xerosis noted to bilateral lower extremities. Nails 1-5 are thickened discolored and elongated with subungual debris. Right hallux with dorsal superficial abrasion at base of lateral toe. Distal hallux with 0.1 cm x 0.1 cm x 0.1 cm wound with granular base. Some erythema surrounding which has been baseline for him. New ulcer to dorsal left proximal leg about 1 cm with granular base. Erythema and edema to leg which has been baseline.  ?Musculoskeletal: MMT 5/5 bilateral lower  extremities in DF, PF, Inversion and Eversion. Deceased ROM in DF of ankle joint.  ?Neurological: Sensation intact to light touch.  ? ?Assessment:  ? ?1. Ulcer of right foot, limited to breakdown of skin ([place])   ?2. Venous (peripheral) insufficiency   ?3. Prediabetes   ?4. Non-pressure chronic ulcer of other part of right foot limited to breakdown of skin ([place])   ? ? ? ? ? ? ? ? ? ? ?Plan:  ?Patient was evaluated and treated and all questions answered. ?Ulcer right hallux wound limited to breakdown of skin , right leg wound , ?-Debridement of right toe wound.  ?-Dressed with betadine and DSD.    ?Compression stocking on the left and unna boot applied on the right.  ?-Will send in another referral to wound care for the right leg wound.   ?-No abx necessary.  ?-Discussed glucose control and proper protein-rich diet.  ?-Discussed if any worsening redness, pain, fever or chills to call or may need to report to the emergency room. Patient expressed understanding. ? ? ?Procedure: Excisional Debridement of Wound ?Rationale: Removal of non-viable soft tissue from the wound to promote healing.  ?Anesthesia: none ?Pre-Debridement Wound Measurements: Overylying callus ?Post-Debridement Wound Measurements: 0.1 cm x 0.1cm x 0.1cm right  toe ?Type of Debridement:  Sharp Excisional ?Tissue Removed: Non-viable soft tissue ?Depth of Debridement: subcutaneous tissue. ?Technique: Sharp excisional debridement to bleeding, viable wound base.  ?Dressing: Dry, sterile, compression dressing. ?Disposition: Patient tolerated procedure well. Patient to return in 2 week for follow-up. ? ?Return in about 1 week (around 03/19/2022) for wound check. ? ? ?Follow-up in one week.  Evaluate wound and remove unna boot.  ? ?Return in about 1 week (around 03/19/2022) for wound check. ? ? ?Lorenda Peck, DPM  ? ? ?

## 2022-03-13 DIAGNOSIS — Z20822 Contact with and (suspected) exposure to covid-19: Secondary | ICD-10-CM | POA: Diagnosis not present

## 2022-03-19 ENCOUNTER — Encounter (HOSPITAL_BASED_OUTPATIENT_CLINIC_OR_DEPARTMENT_OTHER): Payer: Medicare Other | Attending: General Surgery | Admitting: General Surgery

## 2022-03-19 ENCOUNTER — Ambulatory Visit: Payer: Medicare Other | Admitting: Podiatry

## 2022-03-19 DIAGNOSIS — L97512 Non-pressure chronic ulcer of other part of right foot with fat layer exposed: Secondary | ICD-10-CM | POA: Diagnosis not present

## 2022-03-19 DIAGNOSIS — N183 Chronic kidney disease, stage 3 unspecified: Secondary | ICD-10-CM | POA: Diagnosis not present

## 2022-03-19 DIAGNOSIS — I872 Venous insufficiency (chronic) (peripheral): Secondary | ICD-10-CM | POA: Diagnosis not present

## 2022-03-19 DIAGNOSIS — R7303 Prediabetes: Secondary | ICD-10-CM | POA: Diagnosis not present

## 2022-03-19 DIAGNOSIS — L97812 Non-pressure chronic ulcer of other part of right lower leg with fat layer exposed: Secondary | ICD-10-CM | POA: Insufficient documentation

## 2022-03-19 DIAGNOSIS — I129 Hypertensive chronic kidney disease with stage 1 through stage 4 chronic kidney disease, or unspecified chronic kidney disease: Secondary | ICD-10-CM | POA: Diagnosis not present

## 2022-03-19 DIAGNOSIS — Z6836 Body mass index (BMI) 36.0-36.9, adult: Secondary | ICD-10-CM | POA: Diagnosis not present

## 2022-03-19 DIAGNOSIS — G6289 Other specified polyneuropathies: Secondary | ICD-10-CM | POA: Diagnosis not present

## 2022-03-19 NOTE — Progress Notes (Addendum)
[name], [facility] ([id number]) ?Visit Report for 03/19/2022 ?Chief Complaint Document Details ?Patient Name: [name]: ?[name], [name] [name] 03/19/2022 10:00 A M ?Medical Record Number: [id number] ?Patient Account Number: [id number] ?Date of Birth/Sex: Treating RN: ?[birth year] (81 y.o. [name] ?Primary Care Provider: [name] Other Clinician: ?Referring Provider: ?Treating Provider/Extender: Fredirick Maudlin ?SIKO RA, REBECCA ?Weeks in Treatment: 0 ?Information Obtained from: Patient ?Chief Complaint ?02/07/2021; patient is here for review of a wound on the tip of his right great toe ?03/19/2022: re-opening of the right great toe wound, with additional wounds on medial aspect of right great toe and right anterior tibial surface ?Electronic Signature(s) ?Signed: 03/19/2022 11:53:35 AM By: Fredirick Maudlin MD FACS ?Entered By: Fredirick Maudlin on 03/19/2022 11:53:35 ?-------------------------------------------------------------------------------- ?Debridement Details ?Patient Name: [name]: ?ISSAIAH, [name] [name] 03/19/2022 10:00 A M ?Medical Record Number: [id number] ?Patient Account Number: [id number] ?Date of Birth/Sex: Treating RN: ?1941/06/07 (81 y.o. [name] ?Primary Care Provider: [name] Other Clinician: ?Referring Provider: ?Treating Provider/Extender: Fredirick Maudlin ?SIKO RA, REBECCA ?Weeks in Treatment: 0 ?Debridement Performed for Assessment: Wound #4 Right,Distal T Great ?oe ?Performed By: Physician Fredirick Maudlin, MD ?Debridement Type: Debridement ?Level of Consciousness (Pre-procedure): Awake and Alert ?Pre-procedure Verification/Time Out Yes - 11:08 ?Taken: ?Start Time: 11:08 ?T Area Debrided (L x W): ?otal 0.5 (cm) x 0.5 (cm) = 0.25 (cm?) ?Tissue and other material debrided: Non-Viable, Callus, Subcutaneous ?Level: Skin/Subcutaneous Tissue ?Debridement Description: Excisional ?Instrument: Curette ?Bleeding: Minimum ?Hemostasis Achieved: Pressure ?End Time:  11:10 ?Procedural Pain: 0 ?Post Procedural Pain: 0 ?Response to Treatment: Procedure was tolerated well ?Level of Consciousness (Post- Awake and Alert ?procedure): ?Post Debridement Measurements of Total Wound ?Length: (cm) 0.3 ?Width: (cm) 0.3 ?Depth: (cm) 0.2 ?Volume: (cm?) 0.014 ?Character of Wound/Ulcer Post Debridement: Improved ?Post Procedure Diagnosis ?Same as Pre-procedure ?Electronic Signature(s) ?Signed: 03/19/2022 2:22:22 PM By: Fredirick Maudlin MD FACS ?Signed: 03/20/2022 5:21:10 PM By: Levan Hurst RN, BSN ?Entered By: Levan Hurst on 03/19/2022 11:11:21 ?-------------------------------------------------------------------------------- ?HPI Details ?Patient Name: [name]: ?[name], [name] [name] 03/19/2022 10:00 A M ?Medical Record Number: [id number] ?Patient Account Number: [id number] ?Date of Birth/Sex: Treating RN: ?10-Sep-1941 (81 y.o. [name] ?Primary Care Provider: [name] Other Clinician: ?Referring Provider: ?Treating Provider/Extender: Fredirick Maudlin ?SIKO RA, REBECCA ?Weeks in Treatment: 0 ?History of Present Illness ?HPI Description: ADMISSION ?02/07/2021 ?This is a 81 year old man who is not a diabetic "prediabetic" who comes in today letter asking [place] to look at an area on his right first toe tip that has been there ?about a year. He has been getting this dressed that [employer] and he had Xeroform on this with gauze. The patient is not exactly sure how this came about but he ?is not able to dress the wound himself because of limitations due to what sounds like spinal stenosis and pain. He had a wound on the ankle as well but that ?healed over. ?He was seen by vascular in May 2021. This was related to a right great toe wound. He had an angiogram that was completely normal on both sides good ?perfusion right into the feet. He also looks like he has lymphedema and he has compression stockings but he cannot get them on and off so he essentially ?leaves he is on even when he  showering ?Past medical history includes prediabetes, low back pain secondary I think the lumbar spinal stenosis has been offered a decompressive laminectomy but he ?has not gone forth with this. 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 ?ABI was noncompressible in our clinic on the right right first toe. ?4/7; patient I admitted the clinic last week. He has an area on the tip of his right first toe. We cleaned this off last week and have been using silver collagen. ?He is in the same crocs today that he wore last week and I told him not to use. For 1 reason or another he could not handle a surgical sandal. He is active on ?his feet moving his business [[employer]]. He is not a diabetic ?4/12; right first toe wound chronic. We have been using silver collagen. Once again he comes in then the same pair of old crocs that he comes in every week. 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 His x-ray suggested suspicious for osteomyelitis with bone ?irregularity at the tuft of the right great toe ?4/21; patient has been using silver collagen every other day to the wound on his right great toe. He continues to wear crocs. He has no complaints or issues ?today. ?5/5; 2-week follow-up. 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 Mild soft tissue edema with ?enhancement at the distal aspect of the great toe suggestive of cellulitis. Fortunately the wound actually looks better. Has been using Hydrofera Blue ?5/12; started him on Augmentin last week 875 twice daily he is tolerating this well. Last creatinine I see is 1.77 in early April. We are using Hydrofera Blue to ?the wound which actually looks better today. ?5/17; tolerating the Augmentin well. He says the wound is actually been hurting him episodically. Actually the surface of this looks quite  good. He did not get ?his lab work done. We are using [facility] he is changing this himself ?6/2; still taking Augmentin he should be rounding into his last week. This was empirically for underlying osteomyelitis. The area on the tip of his toe is still open ?still with some depth but no palpable bone. We have been using Hydrofera Blue. Surface area of the wound has not been making much improvement ?Is work was essentially unremarkable except for a creatinine of 1.79. Reason for his renal insufficiency is not clear he is not a diabetic does not have ?hypertension however he does take NSAIDs and Lasix I wonder if this is the issue here. Note that his creatinine on 11/12/2018 was 1.45. Platelet count slightly ?low at 143 however his inflammatory markers were really in the normal range at 10 and 2 sedimentation rate and C-reactive protein respectively. ?6/9; he apparently had another 2 weeks of Augmentin after we look that this last time. Therefore he should be coming into his last week next week. We are ?using polymen on the tip of the toe. He cannot get down to do the dressing so he comes in for a nurse visit. He comes in in crocs I have warned him against ?this although he says he is using a different shoe for most of the day he works in [employer]. ?6/16; he comes back in in crocs. I have spoken him about this before. His areas on the tip of his toe this actually looks quite good we have been using ?polymen. He has completed his antibiotics which we empirically gave him for underlying osteomyelitis. Everything looks better to me in spite of his ?noncompliance with  footwear recommendation ?7/11; patient has been followed through nurse visits. He was last seen by provider on 6/16. He reports improvement to the wound with the use of PolyMem. He ?denies signs of infection. ?7/21; the patient arrives with the area on the tip of his right great toe completely healed. He has the same proximal on that I see every  time. I treated him ?empirically with Augmentin for osteomyelitis. He needs to be vigilant about this. ?READMISSION ?03/19/2022 ?The patient returns with a reopening of the right great toe wound. Apparently this occu

## 2022-03-20 ENCOUNTER — Ambulatory Visit (INDEPENDENT_AMBULATORY_CARE_PROVIDER_SITE_OTHER): Payer: Medicare Other | Admitting: Physical Medicine and Rehabilitation

## 2022-03-20 ENCOUNTER — Ambulatory Visit
Admission: RE | Admit: 2022-03-20 | Discharge: 2022-03-20 | Disposition: A | Discharge status: Home / Self Care | Payer: Medicare Other | Source: Ambulatory Visit | Attending: General Surgery | Admitting: General Surgery

## 2022-03-20 ENCOUNTER — Ambulatory Visit: Payer: Self-pay

## 2022-03-20 ENCOUNTER — Other Ambulatory Visit: Payer: Self-pay

## 2022-03-20 ENCOUNTER — Encounter: Payer: Self-pay | Admitting: Physical Medicine and Rehabilitation

## 2022-03-20 VITALS — BP 163/82 | HR 76

## 2022-03-20 DIAGNOSIS — M5416 Radiculopathy, lumbar region: Secondary | ICD-10-CM

## 2022-03-20 DIAGNOSIS — S81801A Unspecified open wound, right lower leg, initial encounter: Secondary | ICD-10-CM

## 2022-03-20 DIAGNOSIS — S81802A Unspecified open wound, left lower leg, initial encounter: Secondary | ICD-10-CM

## 2022-03-20 DIAGNOSIS — M7989 Other specified soft tissue disorders: Secondary | ICD-10-CM | POA: Diagnosis not present

## 2022-03-20 MED ORDER — METHYLPREDNISOLONE ACETATE 80 MG/ML IJ SUSP
80.0000 mg | Freq: Once | INTRAMUSCULAR | Status: AC
  Administered 2022-03-20: 80 mg

## 2022-03-20 NOTE — Progress Notes (Signed)
Numeric Pain Rating Scale and Functional Assessment Average Pain 3   In the last MONTH (on 0-10 scale) has pain interfered with the following?  1. General activity like being  able to carry out your everyday physical activities such as walking, climbing stairs, carrying groceries, or moving a chair?  Rating(6)  Patient is here for an injection. Ambulates with cane. Pain is worse with movements. Numbness radiates down right leg. Hurts when carrying things. Takes Hydrocodone, Methocarbamol and Meloxicam. Last injection helped for a few months.   +Driver, -BT, -Dye Allergies.

## 2022-03-20 NOTE — Progress Notes (Signed)
[name], [name] ([id number]) ?Visit Report for 03/19/2022 ?Allergy List Details ?Patient Name: [name]: ?[name], [name] 03/19/2022 10:00 A M ?Medical Record Number: [id number] ?Patient Account Number: [id number] ?Date of Birth/Sex: Treating RN: ?August 28, 1941 (81 y.o. [name] ?Primary Care [name]: [name] Other Clinician: ?Referring [name]: ?Treating [name]: [name] ?[name], [name] ?Weeks in Treatment: 0 ?Allergies ?Active Allergies ?No Known Allergies ?Allergy Notes ?Electronic Signature(s) ?Signed: 03/20/2022 5:21:10 PM By: [name] ?Entered By: [name] on 03/19/2022 10:22:49 ?-------------------------------------------------------------------------------- ?Arrival Information Details ?Patient Name: [name]: ?[name], [name] 03/19/2022 10:00 A M ?Medical Record Number: [id number] ?Patient Account Number: [id number] ?Date of Birth/Sex: Treating RN: ?1941-02-11 (81 y.o. [name] ?Primary Care [name]: [name] Other Clinician: ?Referring [name]: ?Treating [name]/Extender: [name] ?[name], [name] ?Weeks in Treatment: 0 ?Visit Information ?Patient Arrived: [name] ?Arrival Time: 10:18 ?Accompanied By: alone ?Transfer Assistance: None ?Patient Identification Verified: Yes ?Secondary Verification Process Completed: Yes ?Patient Requires Transmission-Based Precautions: No ?Patient Has Alerts: Yes ?Patient Alerts: ?R ABI: 1.14 TBI: 0.90 ?L ABI: 1.16 TBI: 0.80 ?History Since Last Visit ?Added or deleted any medications: No ?Any new allergies or adverse reactions: No ?Had a fall or experienced change in activities of daily living that may affect risk of falls: No ?Signs or symptoms of abuse/neglect since last visito No ?Hospitalized since last visit: No ?Implantable device outside of the clinic excluding cellular tissue based products placed in the center since last visit: No ?Pain Present Now: No ?Electronic  Signature(s) ?Signed: 03/20/2022 5:21:10 PM By: [name] ?Entered By: [name] on 03/19/2022 10:21:44 ?-------------------------------------------------------------------------------- ?Clinic Level of Care Assessment Details ?Patient Name: [name]: ?[name], [name] 03/19/2022 10:00 A M ?Medical Record Number: [id number] ?Patient Account Number: [id number] ?Date of Birth/Sex: Treating RN: ?02/13/1941 (81 y.o. [name] ?Primary Care [name]: [name] Other Clinician: ?Referring [name]: ?Treating [name]: [name] ?[name], [name] ?Weeks in Treatment: 0 ?Clinic Level of Care Assessment Items ?TOOL 1 Quantity Score ?X- 1 0 ?Use when EandM and Procedure is performed on INITIAL visit ?ASSESSMENTS - Nursing Assessment / Reassessment ?X- 1 20 ?General Physical Exam (combine w/ comprehensive assessment (listed just below) when performed on new pt. evals) ?X- 1 25 ?Comprehensive Assessment (HX, ROS, Risk Assessments, Wounds Hx, etc.) ?ASSESSMENTS - Wound and Skin Assessment / Reassessment ?'[]'$  - 0 ?Dermatologic / Skin Assessment (not related to wound area) ?ASSESSMENTS - Ostomy and/or Continence Assessment and Care ?'[]'$  - 0 ?Incontinence Assessment and Management ?'[]'$  - 0 ?Ostomy Care Assessment and Management (repouching, etc.) ?PROCESS - Coordination of Care ?X - Simple Patient / Family Education for ongoing care 1 15 ?'[]'$  - 0 ?Complex (extensive) Patient / Family Education for ongoing care ?X- 1 10 ?Staff obtains Consents, Records, T Results / Process Orders ?est ?'[]'$  - 0 ?Staff telephones HHA, Nursing Homes / Clarify orders / etc ?'[]'$  - 0 ?Routine Transfer to another Facility (non-emergent condition) ?'[]'$  - 0 ?Routine Hospital Admission (non-emergent condition) ?X- 1 15 ?New Admissions / Biomedical engineer / Ordering NPWT Apligraf, etc. ?, ?'[]'$  - 0 ?Emergency Hospital Admission (emergent condition) ?PROCESS - Special Needs ?'[]'$  - 0 ?Pediatric / Minor Patient  Management ?'[]'$  - 0 ?Isolation Patient Management ?'[]'$  - 0 ?Hearing / Language / Visual special needs ?'[]'$  - 0 ?Assessment of Community assistance (transportation, D/C planning, etc.) ?'[]'$  - 0 ?Additional assistance / Altered mentation ?'[]'$  - 0 ?Support Surface(s) Assessment (bed, cushion, seat, etc.) ?  INTERVENTIONS - Miscellaneous ?'[]'$  - 0 ?External ear exam ?'[]'$  - 0 ?Patient Transfer (multiple staff / Civil Service fast streamer / Similar devices) ?'[]'$  - 0 ?Simple Staple / Suture removal (25 or less) ?'[]'$  - 0 ?Complex Staple / Suture removal (26 or more) ?'[]'$  - 0 ?Hypo/Hyperglycemic Management (do not check if billed separately) ?'[]'$  - 0 ?Ankle / Brachial Index (ABI) - do not check if billed separately ?Has the patient been seen at the hospital within the last three years: Yes ?Total Score: 85 ?Level Of Care: New/Established - Level 3 ?Electronic Signature(s) ?Signed: 03/20/2022 5:21:10 PM By: [name] ?Entered By: [name] on 03/19/2022 17:40:53 ?-------------------------------------------------------------------------------- ?Compression Therapy Details ?Patient Name: ?[name]: ?[name], [name] 03/19/2022 10:00 A M ?Medical Record Number: [id number] ?Patient Account Number: [id number] ?Date of Birth/Sex: ?Treating RN: ?01-Jul-1941 (81 y.o. [name] ?Primary Care [name]: [name] ?Other Clinician: ?Referring [name]: ?Treating [name]/Extender: [name] ?[name], [name] ?Weeks in Treatment: 0 ?Compression Therapy Performed for Wound Assessment: Wound #2 Right,Anterior Lower Leg ?Performed By: Clinician [name] Hurst, RN ?Compression Type: Double Layer ?Post Procedure Diagnosis ?Same as Pre-procedure ?Electronic Signature(s) ?Signed: 03/20/2022 5:21:10 PM By: [name] ?Entered By: [name] on 03/19/2022 11:22:38 ?-------------------------------------------------------------------------------- ?Encounter Discharge Information Details ?Patient Name: ?Date of  Service: ?[name], [name] 03/19/2022 10:00 A M ?Medical Record Number: [id number] ?Patient Account Number: [id number] ?Date of Birth/Sex: ?Treating RN: ?10-Nov-1941 (81 y.o. [name] ?Primary Care Reginna Sermeno: [name] ?Other Clinician: ?Referring Abdimalik Mayorquin: ?Treating Roshon Duell/Extender: [name] ?[name], [name] ?Weeks in Treatment: 0 ?Encounter Discharge Information Items Post Procedure Vitals ?Discharge Condition: Stable ?Temperature (F): 98.0 ?Ambulatory Status: [name] ?Pulse (bpm): 71 ?Discharge Destination: Home ?Respiratory Rate (breaths/min): 20 ?Transportation: Private Auto ?Blood Pressure (mmHg): 151/59 ?Accompanied By: alone ?Schedule Follow-up Appointment: Yes ?Clinical Summary of Care: Patient Declined ?Electronic Signature(s) ?Signed: 03/20/2022 5:21:10 PM By: [name] ?Entered By: [name] on 03/19/2022 17:41:57 ?-------------------------------------------------------------------------------- ?Lower Extremity Assessment Details ?Patient Name: ?[name]: ?[name], [name] 03/19/2022 10:00 A M ?Medical Record Number: [id number] ?Patient Account Number: [id number] ?Date of Birth/Sex: ?Treating RN: ?11-17-1940 (81 y.o. [name] ?Primary Care Dylin Ihnen: [name] ?Other Clinician: ?Referring Susumu Hackler: ?Treating Breylin Dom/Extender: [name] ?[name], [name] ?Weeks in Treatment: 0 ?Edema Assessment ?Assessed: [Left: No] [Right: No] ?Edema: [Left: Ye] [Right: s] ?Calf ?Left: Right: ?Point of Measurement: 29 cm From Medial Instep 37 cm ?Ankle ?Left: Right: ?Point of Measurement: 12 cm From Medial Instep 25.7 cm ?Knee To Floor ?Left: Right: ?From Medial Instep 38 cm ?Vascular Assessment ?Pulses: ?Dorsalis Pedis ?Palpable: [Right:Yes] ?Electronic Signature(s) ?Signed: 03/20/2022 5:21:10 PM By: [name] ?Entered By: [name] on 03/19/2022  10:42:56 ?-------------------------------------------------------------------------------- ?Multi Wound Chart Details ?Patient Name: ?[name]: ?[name], [name] 03/19/2022 10:00 A M ?Medical Record Number: [id number] ?Patient Account Number: [id number] ?Date of Birth/Sex: ?Treating RN: ?[birth year] (81 y.o. M)

## 2022-03-20 NOTE — Progress Notes (Signed)
[name] ([id number]) ?Visit Report for 03/19/2022 ?Abuse Risk Screen Details ?Patient Name: [name]: ?[name], [name] [name] 03/19/2022 10:00 A M ?Medical Record Number: [id number] ?Patient Account Number: [id number] ?Date of Birth/Sex: Treating RN: ?03/17/1941 (81 y.o. [name] ?Primary Care Evertt Chouinard: [name] Other Clinician: ?Referring Audi Wettstein: ?Treating Chelsa Stout/Extender: Fredirick Maudlin ?SIKO RA, REBECCA ?Weeks in Treatment: 0 ?Abuse Risk Screen Items ?Answer ?ABUSE RISK SCREEN: ?Has anyone close to you tried to hurt or harm you recentlyo No ?Do you feel uncomfortable with anyone in your familyo No ?Has anyone forced you do things that you didnt want to doo No ?Electronic Signature(s) ?Signed: 03/20/2022 5:21:10 PM By: Levan Hurst RN, BSN ?Entered By: Levan Hurst on 03/19/2022 10:25:12 ?-------------------------------------------------------------------------------- ?Activities of Daily Living Details ?Patient Name: [name]: ?[name], [name] [name] 03/19/2022 10:00 A M ?Medical Record Number: [id number] ?Patient Account Number: [id number] ?Date of Birth/Sex: Treating RN: ?07/28/1941 (81 y.o. [name] ?Primary Care Johnie Stadel: [name] Other Clinician: ?Referring Yarlin Breisch: ?Treating Love Milbourne/Extender: Fredirick Maudlin ?SIKO RA, REBECCA ?Weeks in Treatment: 0 ?Activities of Daily Living Items ?Answer ?Activities of Daily Living (Please select one for each item) ?[place] ?T Medications ?ake Completely Able ?Use T elephone Completely Able ?Care for Appearance Completely Able ?Use T oilet Completely Able ?Bath / Shower Completely Able ?Dress Self Completely Able ?Feed Self Completely Able ?Walk Completely Able ?Get In / Out Bed Completely Able ?Housework Completely Able ?Prepare Meals Completely Able ?Handle Money Completely Able ?Shop for Self Completely Able ?Electronic Signature(s) ?Signed: 03/20/2022 5:21:10 PM By: Levan Hurst RN,  BSN ?Entered By: Levan Hurst on 03/19/2022 10:25:33 ?-------------------------------------------------------------------------------- ?Education Screening Details ?Patient Name: ?[name]: ?[name], [name] [name] 03/19/2022 10:00 A M ?Medical Record Number: [id number] ?Patient Account Number: [id number] ?Date of Birth/Sex: ?Treating RN: ?07/28/1941 (81 y.o. [name] ?Primary Care Tarica Harl: [name] ?Other Clinician: ?Referring Itza Maniaci: ?Treating Ahniyah Giancola/Extender: Fredirick Maudlin ?SIKO RA, REBECCA ?Weeks in Treatment: 0 ?Primary Learner Assessed: Patient ?Learning Preferences/Education Level/Primary Language ?Learning Preference: Explanation, Demonstration, Printed Material ?Highest Education Level: College or Above ?Preferred Language: English ?Cognitive Barrier ?Language Barrier: No ?Translator Needed: No ?Memory Deficit: No ?Emotional Barrier: No ?Cultural/Religious Beliefs Affecting Medical Care: No ?Physical Barrier ?Impaired Vision: No ?Impaired Hearing: No ?Decreased Hand dexterity: No ?Knowledge/Comprehension ?Knowledge Level: High ?Comprehension Level: High ?Ability to understand written instructions: High ?Ability to understand verbal instructions: High ?Motivation ?Anxiety Level: Calm ?Cooperation: Cooperative ?Education Importance: Acknowledges Need ?Interest in Health Problems: Asks Questions ?Perception: Coherent ?Willingness to Engage in Self-Management High ?Activities: ?Readiness to Engage in Self-Management High ?Activities: ?Electronic Signature(s) ?Signed: 03/20/2022 5:21:10 PM By: Levan Hurst RN, BSN ?Entered By: Levan Hurst on 03/19/2022 10:26:00 ?-------------------------------------------------------------------------------- ?Fall Risk Assessment Details ?Patient Name: ?[name]: ?[name], [name] [name] 03/19/2022 10:00 A M ?Medical Record Number: [id number] ?Patient Account Number: [id number] ?Date of Birth/Sex: ?Treating RN: ?26-Jun-1941 (81 y.o. [name] ?Primary Care Ch[place]uma Straus: [name] ?Other Clinician: ?Referring Ozella Comins: ?Treating Aneshia Jacquet/Extender: Fredirick Maudlin ?SIKO RA, REBECCA ?Weeks in Treatment: 0 ?Fall Risk Assessment Items ?Have you had 2 or more falls in the last 12 monthso 0 No ?Have you had any fall that resulted in injury in the last 12 monthso 0 No ?FALLS RISK SCREEN ?History of falling - immediate or within 3 months 0 No ?Secondary diagnosis (Do you have 2 or more medical diagnoseso) 15 Yes ?Ambulatory aid ?None/bed rest/wheelchair/nurse 0 No ?Crutches/cane/walker 15 Yes ?Furniture 0 No ?Intravenous therapy Access/Saline/Heparin [employer]  0 No ?Gait/Transferring ?Normal/ bed rest/ wheelchair 0 Yes ?Weak (short steps with or without shuffle, stooped but able to lift head while walking, may seek 0 No ?support from furniture) ?Impaired (short steps with shuffle, may have difficulty arising from chair, head down, impaired 0 No ?balance) ?Mental Status ?Oriented to own ability 0 Yes ?Electronic Signature(s) ?Signed: 03/20/2022 5:21:10 PM By: Levan Hurst RN, BSN ?Entered By: Levan Hurst on 03/19/2022 10:26:15 ?-------------------------------------------------------------------------------- ?Foot Assessment Details ?Patient Name: ?[name]: ?[name], [name] [name] 03/19/2022 10:00 A M ?Medical Record Number: [id number] ?Patient Account Number: [id number] ?Date of Birth/Sex: ?Treating RN: ?[birth year] (81 y.o. [name] ?Primary Care Azyah Flett: [name] ?Other Clinician: ?Referring Alera Quevedo: ?Treating Tameah Mihalko/Extender: Fredirick Maudlin ?SIKO RA, REBECCA ?Weeks in Treatment: 0 ?Foot Assessment Items ?Site Locations ?+ = Sensation present, - = Sensation absent, C = Callus, U = Ulcer ?R = Redness, W = Warmth, M = Maceration, PU = Pre-ulcerative lesion ?F = Fissure, S = Swelling, D = Dryness ?Assessment ?Right: Left: ?Other Deformity: No No ?Prior Foot Ulcer: No No ?Prior Amputation: No No ?Charcot Joint: No No ?Ambulatory  Status: Ambulatory With Help ?Assistance Device: Cane ?Gait: Steady ?Electronic Signature(s) ?Signed: 03/20/2022 5:21:10 PM By: Levan Hurst RN, BSN ?Entered By: Levan Hurst on 03/19/2022 10:49:35 ?-------------------------------------------------------------------------------- ?Nutrition Risk Screening Details ?Patient Name: ?[name]: ?[name], [name] [name] 03/19/2022 10:00 A M ?Medical Record Number: [id number] ?Patient Account Number: [id number] ?Date of Birth/Sex: ?Treating RN: ?09-24-1941 (81 y.o. [name] ?Primary Care Kaneesha Constantino: [name] ?Other Clinician: ?Referring Susa Bones: ?Treating Breyah Akhter/Extender: Fredirick Maudlin ?SIKO RA, REBECCA ?Weeks in Treatment: 0 ?Height (in): 69 ?Weight (lbs): 250 ?Body Mass Index (BMI): 36.9 ?Nutrition Risk Screening Items ?Score Screening ?NUTRITION RISK SCREEN: ?I have an illness or condition that made me change the kind and/or amount of food I eat 0 No ?I eat fewer than two meals per day 0 No ?I eat few fruits and vegetables, or milk products 0 No ?I have three or more drinks of beer, liquor or wine almost every day 0 No ?I have tooth or mouth problems that make it hard for me to eat 0 No ?I don't always have enough money to buy the food I need 0 No ?I eat alone most of the time 0 No ?I take three or more different prescribed or over-the-counter drugs a day 1 Yes ?Without wanting to, I have lost or gained 10 pounds in the last six months 0 No ?I am not always physically able to shop, cook and/or feed myself 0 No ?Nutrition Protocols ?Good Risk Protocol ?Moderate Risk Protocol 0 Provide education on nutrition ?High Risk Proctocol ?Risk Level: Good Risk ?Score: 1 ?Electronic Signature(s) ?Signed: 03/20/2022 5:21:10 PM By: Levan Hurst RN, BSN ?Entered By: Levan Hurst on 03/19/2022 10:28:15 ?

## 2022-03-20 NOTE — Patient Instructions (Signed)

## 2022-03-22 DIAGNOSIS — M1612 Unilateral primary osteoarthritis, left hip: Secondary | ICD-10-CM | POA: Diagnosis not present

## 2022-03-22 DIAGNOSIS — M25552 Pain in left hip: Secondary | ICD-10-CM | POA: Diagnosis not present

## 2022-03-22 DIAGNOSIS — M1711 Unilateral primary osteoarthritis, right knee: Secondary | ICD-10-CM | POA: Diagnosis not present

## 2022-03-22 DIAGNOSIS — M25561 Pain in right knee: Secondary | ICD-10-CM | POA: Diagnosis not present

## 2022-03-28 ENCOUNTER — Ambulatory Visit: Payer: Medicare Other | Admitting: Pulmonary Disease

## 2022-03-28 ENCOUNTER — Encounter (HOSPITAL_BASED_OUTPATIENT_CLINIC_OR_DEPARTMENT_OTHER): Payer: Medicare Other | Admitting: General Surgery

## 2022-03-31 ENCOUNTER — Ambulatory Visit: Payer: Medicare Other | Admitting: Pulmonary Disease

## 2022-04-02 ENCOUNTER — Encounter (HOSPITAL_BASED_OUTPATIENT_CLINIC_OR_DEPARTMENT_OTHER): Payer: Medicare Other | Admitting: General Surgery

## 2022-04-02 DIAGNOSIS — I129 Hypertensive chronic kidney disease with stage 1 through stage 4 chronic kidney disease, or unspecified chronic kidney disease: Secondary | ICD-10-CM | POA: Diagnosis not present

## 2022-04-02 DIAGNOSIS — L97512 Non-pressure chronic ulcer of other part of right foot with fat layer exposed: Secondary | ICD-10-CM | POA: Diagnosis not present

## 2022-04-02 DIAGNOSIS — R7303 Prediabetes: Secondary | ICD-10-CM | POA: Diagnosis not present

## 2022-04-02 DIAGNOSIS — G6289 Other specified polyneuropathies: Secondary | ICD-10-CM | POA: Diagnosis not present

## 2022-04-02 DIAGNOSIS — N183 Chronic kidney disease, stage 3 unspecified: Secondary | ICD-10-CM | POA: Diagnosis not present

## 2022-04-02 DIAGNOSIS — I872 Venous insufficiency (chronic) (peripheral): Secondary | ICD-10-CM | POA: Diagnosis not present

## 2022-04-02 DIAGNOSIS — L97812 Non-pressure chronic ulcer of other part of right lower leg with fat layer exposed: Secondary | ICD-10-CM | POA: Diagnosis not present

## 2022-04-02 NOTE — Progress Notes (Signed)
ANUP, BRIGHAM ([id number]) Visit Report for 04/02/2022 Arrival Information Details Patient Name: [name]: [name], [name] [name] 04/02/2022 9:00 A M Medical Record Number: [id number] Patient Account Number: [id number] Date of Birth/Sex: Treating RN: 1941/08/18 (81 y.o. [name] Primary Care Quanta Roher: [name] Other Clinician: Referring Kingjames Coury: Treating Cynitha Berte/Extender: Benedict Needy Weeks in Treatment: 2 Visit Information History Since Last Visit Added or deleted any medications: No Patient Arrived: [name] Any new allergies or adverse reactions: No Arrival Time: 09:21 Had a fall or experienced change in No Accompanied By: alone activities of daily living that may affect Transfer Assistance: None risk of falls: Patient Identification Verified: Yes Signs or symptoms of abuse/neglect since last visito No Secondary Verification Process Completed: Yes Hospitalized since last visit: No Patient Requires Transmission-Based Precautions: No Implantable device outside of the clinic excluding No Patient Has Alerts: Yes cellular tissue based products placed in the center Patient Alerts: R ABI: 1.14 TBI: 0.90 since last visit: L ABI: 1.16 TBI: 0.80 Has Dressing in Place as Prescribed: Yes Has Compression in Place as Prescribed: Yes Pain Present Now: Yes Electronic Signature(s) Signed: 04/02/2022 5:57:27 PM By: Levan Hurst RN, BSN Entered By: Levan Hurst on 04/02/2022 09:33:43 -------------------------------------------------------------------------------- Compression Therapy Details Patient Name: [name]: [name] Banker [name] 04/02/2022 9:00 A M Medical Record Number: [id number] Patient Account Number: [id number] Date of Birth/Sex: Treating RN: 04-Apr-1941 (81 y.o. [name] Primary Care Randi Poullard: [name] Other Clinician: Referring Salvator Seppala: Treating Grae Cannata/Extender: Benedict Needy Weeks in  Treatment: 2 Compression Therapy Performed for Wound Assessment: Wound #2 Right,Anterior Lower Leg Performed By: Clinician Levan Hurst, RN Compression Type: Double Layer Post Procedure Diagnosis Same as Pre-procedure Electronic Signature(s) Signed: 04/02/2022 5:57:27 PM By: Levan Hurst RN, BSN Entered By: Levan Hurst on 04/02/2022 09:52:47 -------------------------------------------------------------------------------- Compression Therapy Details Patient Name: [name]: [name] Banker [name] 04/02/2022 9:00 A M Medical Record Number: [id number] Patient Account Number: [id number] Date of Birth/Sex: Treating RN: 1941/08/15 (81 y.o. [name] Primary Care Preslea Rhodus: [name] Other Clinician: Referring Tiara Bartoli: Treating Mccall Lomax/Extender: Benedict Needy Weeks in Treatment: 2 Compression Therapy Performed for Wound Assessment: Wound #5 Right,Distal,Anterior Lower Leg Performed By: Clinician Levan Hurst, RN Compression Type: Double Layer Post Procedure Diagnosis Same as Pre-procedure Electronic Signature(s) Signed: 04/02/2022 5:57:27 PM By: Levan Hurst RN, BSN Entered By: Levan Hurst on 04/02/2022 09:52:47 -------------------------------------------------------------------------------- Compression Therapy Details Patient Name: [name]: [name] Banker [name] 04/02/2022 9:00 A M Medical Record Number: [id number] Patient Account Number: [id number] Date of Birth/Sex: Treating RN: 12/08/1940 (81 y.o. [name] Primary Care Saragrace Selke: [name] Other Clinician: Referring Merrie Epler: Treating Jeziah Kretschmer/Extender: Benedict Needy Weeks in Treatment: 2 Compression Therapy Performed for Wound Assessment: Wound #6 Right,Lateral,Anterior Lower Leg Performed By: Clinician Levan Hurst, RN Compression Type: Double Layer Post Procedure Diagnosis Same as Pre-procedure Electronic Signature(s) Signed:  04/02/2022 5:57:27 PM By: Levan Hurst RN, BSN Entered By: Levan Hurst on 04/02/2022 09:52:47 -------------------------------------------------------------------------------- Encounter Discharge Information Details Patient Name: [name]: [name] Banker [name] 04/02/2022 9:00 A M Medical Record Number: [id number] Patient Account Number: [id number] Date of Birth/Sex: Treating RN: Apr 01, 1941 (81 y.o. [name] Primary Care Tasia Liz: [name] Other Clinician: Referring Scotland Korver: Treating Inga Noller/Extender: Benedict Needy Weeks in Treatment: 2 Encounter Discharge Information Items Post Procedure Vitals Discharge Condition: Stable Temperature (F): 98.0 Ambulatory Status: Cane Pulse (bpm): 70 Discharge Destination: Home  Respiratory Rate (breaths/min): 18 Transportation: Private Auto Blood Pressure (mmHg): 150/68 Accompanied By: alone Schedule Follow-up Appointment: Yes Clinical Summary of Care: Patient Declined Electronic Signature(s) Signed: 04/02/2022 5:57:27 PM By: Levan Hurst RN, BSN Entered By: Levan Hurst on 04/02/2022 11:27:09 -------------------------------------------------------------------------------- Lower Extremity Assessment Details Patient Name: [name]: [name] Banker [name] 04/02/2022 9:00 A M Medical Record Number: [id number] Patient Account Number: [id number] Date of Birth/Sex: Treating RN: [birth year] (81 y.o. [name] Primary Care Talyssa Gibas: [name] Other Clinician: Referring Tynisa Vohs: Treating Chanette Demo/Extender: Benedict Needy Weeks in Treatment: 2 Edema Assessment Assessed: [name]: No] [Right: No] Edema: [Left: Ye] [Right: s] Calf Left: Right: Point of Measurement: 29 cm From Medial Instep 35.5 cm Ankle Left: Right: Point of Measurement: 12 cm From Medial Instep 26 cm Vascular Assessment Pulses: Dorsalis Pedis Palpable: [Right:Yes] Electronic  Signature(s) Signed: 04/02/2022 5:57:27 PM By: Levan Hurst RN, BSN Entered By: Levan Hurst on 04/02/2022 09:36:30 -------------------------------------------------------------------------------- Multi Wound Chart Details Patient Name: [name]: [name] Banker [name] 04/02/2022 9:00 A M Medical Record Number: [id number] Patient Account Number: [id number] Date of Birth/Sex: Treating RN: 05-01-1941 (81 y.o. 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 Etiology: Cataracts, Chronic sinus Cataracts, Chronic sinus Cataracts, Chronic sinus Comorbid History: problems/congestion, Asthma, Sleep problems/congestion, Asthma, Sleep problems/congestion, Asthma, Sleep Apnea, Hypertension, Peripheral Apnea, Hypertension, Peripheral Apnea, Hypertension, Peripheral Venous Disease, Osteoarthritis Venous Disease, Osteoarthritis Venous Disease, Osteoarthritis 03/03/2022 03/10/2022 11/10/2021 Date Acquired: '2 2 2 '$ Weeks of Treatment: Open Open Open Wound Status: No No No Wound Recurrence: 1.7x2x0.1 0.5x0.6x0.1 0.1x0.1x0.1 Measurements L x W x D (cm) 2.67 0.236 0.008 A (cm) : rea 0.267 0.024 0.001 Volume (cm) : 34.60% 86.30% 88.70% % Reduction in A rea: 34.60% 86.10% 92.90% % Reduction in Volume: Full Thickness Without Exposed Full Thickness Without Exposed Full Thickness Without Exposed Classification: Support Structures Support Structures  Support Structures Medium Medium Small Exudate A mount: Serosanguineous Serosanguineous Serosanguineous Exudate Type: red, brown red, brown red, brown Exudate Color: Flat and Intact Flat and Intact Well defined, not attached Wound Margin: Large (67-100%) Large (67-100%) None Present (0%) Granulation A mount: Red, Pink Red N/A Granulation Quality: Small (1-33%) None Present (0%) Large (67-100%) Necrotic A mount: Adherent Slough N/A Eschar Necrotic Tissue: Fat Layer (Subcutaneous Tissue): Yes Fat Layer (Subcutaneous Tissue): Yes Fat Layer (Subcutaneous Tissue): Yes Exposed Structures: Fascia: No Fascia: No Fascia: No Tendon: No Tendon: No Tendon: No Muscle: No Muscle: No Muscle: No Joint: No Joint: No Joint: No Bone: No Bone: No Bone: No Small (1-33%) Medium (34-66%) Large (67-100%) Epithelialization: Debridement - Selective/Open Wound N/A Debridement - Selective/Open Wound Debridement: Pre-procedure Verification/Time Out 09:48 N/A 09:48 Taken: [facility] N/A Necrotic/Eschar, Callus Tissue Debrided: Non-Viable Tissue N/A Non-Viable Tissue Level: 3.4 N/A 0.25 Debridement A (sq cm): rea Curette N/A Curette Instrument: Minimum N/A Minimum Bleeding: Pressure N/A Pressure Hemostasis A chieved: 0 N/A 0 Procedural Pain: 0 N/A 0 Post Procedural Pain: Procedure was tolerated well N/A Procedure was tolerated well Debridement Treatment Response: 1.7x2x0.1 N/A 0.1x0.1x0.1 Post Debridement Measurements L x W x D (cm) 0.267 N/A 0.001 Post  Debridement Volume: (cm) Compression Therapy N/A Debridement Procedures Performed: Debridement Wound Number: 5 6 N/A Photos: N/A Right, Distal, Anterior Lower Leg Right, Lateral, Anterior Lower Leg N/A Wound Location: Blister Blister N/A 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 N/A Primary Etiology: Cataracts, Chronic sinus Cataracts, Chronic sinus N/A Comorbid History: problems/congestion, Asthma, Sleep  problems/congestion, Asthma, Sleep Apnea, Hypertension, Peripheral Apnea, Hypertension, Peripheral Venous Disease, Osteoarthritis Venous Disease, Osteoarthritis 04/02/2022 04/02/2022 N/A Date Acquired: 0 0 N/A Weeks of Treatment: Open Open N/A Wound Status: No No N/A Wound Recurrence: 1.5x0.8x0.1 0.7x0.5x0.1 N/A Measurements L x W x D (cm) 0.942 0.275 N/A A (cm) : rea 0.094 0.027 N/A Volume (cm) : 0.00% 0.00% N/A % Reduction in Area: 0.00% 0.00% N/A % Reduction in Volume: Full Thickness Without Exposed Full Thickness Without Exposed N/A Classification: Support Structures Support Structures Medium Medium N/A Exudate A mount: Serous Serosanguineous N/A Exudate Type: amber red, brown N/A Exudate Color: Flat and Intact Flat and Intact N/A Wound Margin: Large (67-100%) Large (67-100%) N/A Granulation A mount: Pink, Pale Red, Pink N/A Granulation Quality: Small (1-33%) None Present (0%) N/A Necrotic A mount: Adherent Slough N/A N/A Necrotic Tissue: Fat Layer (Subcutaneous Tissue): Yes Fat Layer (Subcutaneous Tissue): Yes N/A Exposed Structures: Fascia: No Fascia: No Tendon: No Tendon: No Muscle: No Muscle: No Joint: No Joint: No Bone: No Bone: No None None N/A Epithelialization: Debridement - Selective/Open Wound N/A N/A Debridement: Pre-procedure Verification/Time Out 09:48 N/A N/A Taken: Slough N/A N/A Tissue Debrided: Non-Viable Tissue N/A N/A Level: 1.2 N/A N/A Debridement A (sq cm): rea Curette N/A N/A Instrument: Minimum N/A N/A Bleeding: Pressure N/A N/A Hemostasis A chieved: 0 N/A N/A Procedural Pain: 0 N/A N/A Post Procedural Pain: Procedure was tolerated well N/A N/A Debridement Treatment Response: 1.5x0.8x0.1 N/A N/A Post Debridement Measurements L x W x D (cm) 0.094 N/A N/A Post Debridement Volume: (cm) Compression Therapy Compression Therapy N/A Procedures Performed: Debridement Treatment Notes Electronic  Signature(s) Signed: 04/02/2022 10:03:08 AM By: Fredirick Maudlin MD FACS Signed: 04/02/2022 5:57:27 PM By: Levan Hurst RN, BSN Entered By: Fredirick Maudlin on 04/02/2022 10:03:08 -------------------------------------------------------------------------------- Multi-Disciplinary Care Plan Details Patient Name: [name]: [name] Banker [name] 04/02/2022 9:00 A M Medical Record Number: [id number] Patient Account Number: [id number] Date of Birth/Sex: Treating RN: Oct 12, 1941 (81 y.o. [name] Primary Care Pink Maye: [name] Other Clinician: Referring Nashira Mcglynn: Treating Carnelius Hammitt/Extender: Benedict Needy Weeks in Treatment: 2 Multidisciplinary Care Plan reviewed with physician Active Inactive Abuse / Safety / Falls / Self Care Management Nursing Diagnoses: Potential for falls Potential for injury related to falls Goals: Patient will not experience any injury related to falls Date Initiated: 03/19/2022 Target Resolution Date: 04/18/2022 Goal Status: Active Patient/caregiver will verbalize/demonstrate measures taken to prevent injury and/or falls Date Initiated: 03/19/2022 Target Resolution Date: 04/18/2022 Goal Status: Active Interventions: Assess Activities of Daily Living upon admission and as needed Assess fall risk on admission and as needed Assess: immobility, friction, shearing, incontinence upon admission and as needed Assess impairment of mobility on admission and as needed per policy Assess personal safety and home safety (as indicated) on admission and as needed Assess self care needs on admission and as needed Provide education on fall prevention Provide education on personal and home safety Notes: Nutrition Nursing Diagnoses: Impaired glucose control: actual or potential Potential for alteratiion in Nutrition/Potential for imbalanced nutrition Goals: Patient/caregiver will maintain therapeutic glucose control Date Initiated:  03/19/2022 Target Resolution Date: 04/18/2022 Goal Status: Active Interventions: Assess HgA1c results  as ordered upon admission and as needed Assess patient nutrition upon admission and as needed per policy Provide education on elevated blood sugars and impact on wound healing Provide education on nutrition Treatment Activities: Education provided on Nutrition : 03/19/2022 Notes: Wound/Skin Impairment Nursing Diagnoses: Impaired tissue integrity Knowledge deficit related to ulceration/compromised skin integrity Goals: Patient/caregiver will verbalize understanding of skin care regimen Date Initiated: 03/19/2022 Target Resolution Date: 04/18/2022 Goal Status: Active Interventions: Assess patient/caregiver ability to obtain necessary supplies Assess patient/caregiver ability to perform ulcer/skin care regimen upon admission and as needed Assess ulceration(s) every visit Provide education on ulcer and skin care Notes: Electronic Signature(s) Signed: 04/02/2022 5:57:27 PM By: Levan Hurst RN, BSN Entered By: Levan Hurst on 04/02/2022 09:38:49 -------------------------------------------------------------------------------- Pain Assessment Details Patient Name: [name]: [name] Banker [name] 04/02/2022 9:00 A M Medical Record Number: [id number] Patient Account Number: [id number] Date of Birth/Sex: Treating RN: 01/16/1941 (81 y.o. [name] Primary Care Zell Doucette: [name] Other Clinician: Referring March Steyer: Treating Galileah Piggee/Extender: Benedict Needy Weeks in Treatment: 2 Active Problems Location of Pain Severity and Description of Pain Patient Has Paino Yes Site Locations Pain Location: Generalized Pain With Dressing Change: Yes Duration of the Pain. Constant / Intermittento Intermittent Rate the pain. Current Pain Level: 5 Character of Pain Describe the Pain: Aching Pain Management and Medication Current Pain  Management: Medication: Yes Cold Application: No Rest: No Massage: No Activity: No T.E.N.S.: No Heat Application: No Leg drop or elevation: No Is the Current Pain Management Adequate: Adequate How does your wound impact your activities of daily livingo Sleep: No Bathing: No Appetite: No Relationship With Others: No Bladder Continence: No Emotions: No Bowel Continence: No Work: No Toileting: No Drive: No Dressing: No Hobbies: No Engineer, maintenance) Signed: 04/02/2022 5:57:27 PM By: Levan Hurst RN, BSN Entered By: Levan Hurst on 04/02/2022 09:35:49 -------------------------------------------------------------------------------- Patient/Caregiver Education Details Patient Name: [name]: [name] Banker [name] 5/24/2023andnbsp9:00 A M Medical Record Number: [id number] Patient Account Number: [id number] Date of Birth/Gender: Treating RN: April 21, 1941 (81 y.o. [name] Primary Care Physician: [name] Other Clinician: Referring Physician: Treating Physician/Extender: Benedict Needy Weeks in Treatment: 2 Education Assessment Education Provided To: Patient Education Topics Provided Wound/Skin Impairment: Methods: Explain/Verbal Responses: State content correctly Electronic Signature(s) Signed: 04/02/2022 5:57:27 PM By: Levan Hurst RN, BSN Entered By: Levan Hurst on 04/02/2022 09:40:30 -------------------------------------------------------------------------------- Wound Assessment Details Patient Name: [name]: [name] Banker [name] 04/02/2022 9:00 A M Medical Record Number: [id number] Patient Account Number: [id number] Date of Birth/Sex: Treating RN: 05-02-1941 (81 y.o. [name] Primary Care Filicia Scogin: [name] Other Clinician: Referring Chonda Baney: Treating Nile Prisk/Extender: Benedict Needy Weeks in Treatment: 2 Wound Status Wound Number: 2 Primary Venous Leg  Ulcer Etiology: Wound Location: Right, Anterior Lower Leg Wound Open Wounding Event: Blister Status: Date Acquired: 03/03/2022 Comorbid Cataracts, Chronic sinus problems/congestion, Asthma, Sleep Weeks Of Treatment: 2 History: Apnea, Hypertension, Peripheral Venous Disease, Osteoarthritis Clustered Wound: No Photos Wound Measurements Length: (cm) 1.7 Width: (cm) 2 Depth: (cm) 0.1 Area: (cm) 2.67 Volume: (cm) 0.267 % Reduction in Area: 34.6% % Reduction in Volume: 34.6% Epithelialization: Small (1-33%) Tunneling: No Undermining: No Wound Description Classification: Full Thickness Without Exposed Support Structures Wound Margin: Flat and Intact Exudate Amount: Medium Exudate Type: Serosanguineous Exudate Color: red, brown Foul Odor After Cleansing: No Slough/Fibrino Yes Wound Bed Granulation Amount: Large (67-100%) Exposed Structure Granulation Quality: Red, Pink Fascia Exposed: No Necrotic Amount: Small (1-33%)  Fat Layer (Subcutaneous Tissue) Exposed: Yes Necrotic Quality: Adherent Slough Tendon Exposed: No Muscle Exposed: No Joint Exposed: No Bone Exposed: No Treatment Notes Wound #2 (Lower Leg) Wound Laterality: Right, Anterior Cleanser Soap and Water Discharge Instruction: May shower and wash wound with dial antibacterial soap and water prior to dressing change. Wound Cleanser Discharge Instruction: Cleanse the wound with wound cleanser prior to applying a clean dressing using gauze sponges, not tissue or cotton balls. Peri-Wound Care Zinc Oxide Ointment 30g tube Discharge Instruction: Apply Zinc Oxide to periwound with each dressing change Sween Lotion (Moisturizing lotion) Discharge Instruction: Apply moisturizing lotion as directed Topical Primary Dressing KerraCel Ag Gelling Fiber Dressing, 2x2 in (silver alginate) Discharge Instruction: Apply silver alginate to wound bed as instructed Secondary Dressing ABD Pad, 5x9 Discharge Instruction: Apply over  primary dressing as directed. Zetuvit Plus 4x8 in Discharge Instruction: Apply over primary dressing as directed. Secured With Compression Wrap CoFlex TLC XL 2-layer Compression System 4x7 (in/yd) Discharge Instruction: Apply CoFlex 2-layer compression as directed. (alt for 4 layer) Compression Stockings Add-Ons Electronic Signature(s) Signed: 04/02/2022 5:57:27 PM By: Levan Hurst RN, BSN Entered By: Levan Hurst on 04/02/2022 09:40:48 -------------------------------------------------------------------------------- Wound Assessment Details Patient Name: [name]: [name] Banker [name] 04/02/2022 9:00 A M Medical Record Number: [id number] Patient Account Number: [id number] Date of Birth/Sex: Treating RN: [birth year] (81 y.o. [name] Primary Care Tinisha Etzkorn: [name] Other Clinician: Referring Monya Kozakiewicz: Treating Orra Nolde/Extender: Benedict Needy Weeks in Treatment: 2 Wound Status Wound Number: 3 Primary Neuropathic Ulcer-Non Diabetic Etiology: Wound Location: Right, Lateral T Great oe Wound Open Wounding Event: Blister Status: Date Acquired: 03/10/2022 Comorbid Cataracts, Chronic sinus problems/congestion, Asthma, Sleep Weeks Of Treatment: 2 History: Apnea, Hypertension, Peripheral Venous Disease, Osteoarthritis Clustered Wound: No Photos Wound Measurements Length: (cm) 0.5 Width: (cm) 0.6 Depth: (cm) 0.1 Area: (cm) 0.236 Volume: (cm) 0.024 % Reduction in Area: 86.3% % Reduction in Volume: 86.1% Epithelialization: Medium (34-66%) Tunneling: No Undermining: No Wound Description Classification: Full Thickness Without Exposed Support Structures Wound Margin: Flat and Intact Exudate Amount: Medium Exudate Type: Serosanguineous Exudate Color: red, brown Foul Odor After Cleansing: No Slough/Fibrino No Wound Bed Granulation Amount: Large (67-100%) Exposed Structure Granulation Quality: Red Fascia Exposed: No Necrotic  Amount: None Present (0%) Fat Layer (Subcutaneous Tissue) Exposed: Yes Tendon Exposed: No Muscle Exposed: No Joint Exposed: No Bone Exposed: No Treatment Notes Wound #3 (Toe Great) Wound Laterality: Right, Lateral Cleanser Soap and Water Discharge Instruction: May shower and wash wound with dial antibacterial soap and water prior to dressing change. Wound Cleanser Discharge Instruction: Cleanse the wound with wound cleanser prior to applying a clean dressing using gauze sponges, not tissue or cotton balls. Peri-Wound Care Topical Primary Dressing KerraCel Ag Gelling Fiber Dressing, 2x2 in (silver alginate) Discharge Instruction: Apply silver alginate to wound bed as instructed Secondary Dressing Woven Gauze Sponges 2x2 in Discharge Instruction: Apply over primary dressing as directed. Secured With Conforming Stretch Gauze Bandage, Sterile 2x75 (in/in) Discharge Instruction: Secure with stretch gauze as directed. 58M Medipore Soft Cloth Surgical T 2x10 (in/yd) ape Discharge Instruction: Secure with tape as directed. Compression Wrap Compression Stockings Add-Ons Electronic Signature(s) Signed: 04/02/2022 5:57:27 PM By: Levan Hurst RN, BSN Entered By: Levan Hurst on 04/02/2022 09:38:06 -------------------------------------------------------------------------------- Wound Assessment Details Patient Name: [name]: [name] Banker [name] 04/02/2022 9:00 A M Medical Record Number: [id number] Patient Account Number: [id number] Date of Birth/Sex: Treating RN: 23-Jun-1941 (81 y.o. [name] Primary Care Nyashia Raney: [name] Other  Clinician: Referring Wavie Hashimi: Treating Marquist Binstock/Extender: Benedict Needy Weeks in Treatment: 2 Wound Status Wound Number: 4 Primary Neuropathic Ulcer-Non Diabetic Etiology: Wound Location: Right, Distal T Great oe Wound Open Wounding Event: Gradually Appeared Status: Date Acquired: 11/10/2021 Comorbid  Cataracts, Chronic sinus problems/congestion, Asthma, Sleep Weeks Of Treatment: 2 History: Apnea, Hypertension, Peripheral Venous Disease, Osteoarthritis Clustered Wound: No Photos Wound Measurements Length: (cm) 0.1 Width: (cm) 0.1 Depth: (cm) 0.1 Area: (cm) 0.008 Volume: (cm) 0.001 % Reduction in Area: 88.7% % Reduction in Volume: 92.9% Epithelialization: Large (67-100%) Tunneling: No Undermining: No Wound Description Classification: Full Thickness Without Exposed Support Structures Wound Margin: Well defined, not attached Exudate Amount: Small Exudate Type: Serosanguineous Exudate Color: red, brown Foul Odor After Cleansing: No Slough/Fibrino Yes Wound Bed Granulation Amount: None Present (0%) Exposed Structure Necrotic Amount: Large (67-100%) Fascia Exposed: No Necrotic Quality: Eschar Fat Layer (Subcutaneous Tissue) Exposed: Yes Tendon Exposed: No Muscle Exposed: No Joint Exposed: No Bone Exposed: No Treatment Notes Wound #4 (Toe Great) Wound Laterality: Right, Distal Cleanser Soap and Water Discharge Instruction: May shower and wash wound with dial antibacterial soap and water prior to dressing change. Wound Cleanser Discharge Instruction: Cleanse the wound with wound cleanser prior to applying a clean dressing using gauze sponges, not tissue or cotton balls. Peri-Wound Care Topical Primary Dressing KerraCel Ag Gelling Fiber Dressing, 2x2 in (silver alginate) Discharge Instruction: Apply silver alginate to wound bed as instructed Secondary Dressing Drawtex 4x4 in Discharge Instruction: Apply over primary dressing as directed. Woven Gauze Sponges 2x2 in Discharge Instruction: Apply over primary dressing as directed. Secured With Conforming Stretch Gauze Bandage, Sterile 2x75 (in/in) Discharge Instruction: Secure with stretch gauze as directed. 45M Medipore Soft Cloth Surgical T 2x10 (in/yd) ape Discharge Instruction: Secure with tape as  directed. Compression Wrap Compression Stockings Add-Ons Electronic Signature(s) Signed: 04/02/2022 5:57:27 PM By: Levan Hurst RN, BSN Entered By: Levan Hurst on 04/02/2022 09:42:00 -------------------------------------------------------------------------------- Wound Assessment Details Patient Name: [name]: [name] Banker [name] 04/02/2022 9:00 A M Medical Record Number: [id number] Patient Account Number: [id number] Date of Birth/Sex: Treating RN: 1941-05-17 (81 y.o. [name] Primary Care Chimene Salo: [name] Other Clinician: Referring Barth Trella: Treating Kyshawn Teal/Extender: Benedict Needy Weeks in Treatment: 2 Wound Status Wound Number: 5 Primary Venous Leg Ulcer Etiology: Wound Location: Right, Distal, Anterior Lower Leg Wound Open Wounding Event: Blister Status: Date Acquired: 04/02/2022 Comorbid Cataracts, Chronic sinus problems/congestion, Asthma, Sleep Weeks Of Treatment: 0 History: Apnea, Hypertension, Peripheral Venous Disease, Osteoarthritis Clustered Wound: No Photos Wound Measurements Length: (cm) 1.5 Width: (cm) 0.8 Depth: (cm) 0.1 Area: (cm) 0.942 Volume: (cm) 0.094 Wound Description Classification: Full Thickness Without Exposed Support Structu Wound Margin: Flat and Intact Exudate Amount: Medium Exudate Type: Serous Exudate Color: amber Foul Odor After Cleansing: Slough/Fibrino % Reduction in Area: 0% % Reduction in Volume: 0% Epithelialization: None Tunneling: No Undermining: No res No Yes Wound Bed Granulation Amount: Large (67-100%) Exposed Structure Granulation Quality: Pink, Pale Fascia Exposed: No Necrotic Amount: Small (1-33%) Fat Layer (Subcutaneous Tissue) Exposed: Yes Necrotic Quality: Adherent Slough Tendon Exposed: No Muscle Exposed: No Joint Exposed: No Bone Exposed: No Treatment Notes Wound #5 (Lower Leg) Wound Laterality: Right, Anterior, Distal Cleanser Soap and  Water Discharge Instruction: May shower and wash wound with dial antibacterial soap and water prior to dressing change. Wound Cleanser Discharge Instruction: Cleanse the wound with wound cleanser prior to applying a clean dressing using gauze sponges, not tissue or cotton balls. Peri-Wound Care Sween Lotion (  Moisturizing lotion) Discharge Instruction: Apply moisturizing lotion as directed Topical Primary Dressing KerraCel Ag Gelling Fiber Dressing, 2x2 in (silver alginate) Discharge Instruction: Apply silver alginate to wound bed as instructed Secondary Dressing ABD Pad, 5x9 Discharge Instruction: Apply over primary dressing as directed. Zetuvit Plus 4x8 in Discharge Instruction: Apply over primary dressing as directed. Secured With Compression Wrap CoFlex TLC XL 2-layer Compression System 4x7 (in/yd) Discharge Instruction: Apply CoFlex 2-layer compression as directed. (alt for 4 layer) Compression Stockings Add-Ons Electronic Signature(s) Signed: 04/02/2022 5:57:27 PM By: Levan Hurst RN, BSN Entered By: Levan Hurst on 04/02/2022 09:42:09 -------------------------------------------------------------------------------- Wound Assessment Details Patient Name: [name]: [name] Banker [name] 04/02/2022 9:00 A M Medical Record Number: [id number] Patient Account Number: [id number] Date of Birth/Sex: Treating RN: [birth year] (81 y.o. [name] Primary Care Anothy Bufano: [name] Other Clinician: Referring Donnavin Vandenbrink: Treating Shelsea Hangartner/Extender: Benedict Needy Weeks in Treatment: 2 Wound Status Wound Number: 6 Primary Venous Leg Ulcer Etiology: Wound Location: Right, Lateral, Anterior Lower Leg Wound Open Wounding Event: Blister Status: Date Acquired: 04/02/2022 Comorbid Cataracts, Chronic sinus problems/congestion, Asthma, Sleep Weeks Of Treatment: 0 History: Apnea, Hypertension, Peripheral Venous Disease, Osteoarthritis Clustered Wound:  No Photos Wound Measurements Length: (cm) 0.7 Width: (cm) 0.5 Depth: (cm) 0.1 Area: (cm) 0.275 Volume: (cm) 0.027 % Reduction in Area: 0% % Reduction in Volume: 0% Epithelialization: None Tunneling: No Undermining: No Wound Description Classification: Full Thickness Without Exposed Support Structures Wound Margin: Flat and Intact Exudate Amount: Medium Exudate Type: Serosanguineous Exudate Color: red, brown Foul Odor After Cleansing: No Slough/Fibrino No Wound Bed Granulation Amount: Large (67-100%) Exposed Structure Granulation Quality: Red, Pink Fascia Exposed: No Necrotic Amount: None Present (0%) Fat Layer (Subcutaneous Tissue) Exposed: Yes Tendon Exposed: No Muscle Exposed: No Joint Exposed: No Bone Exposed: No Treatment Notes Wound #6 (Lower Leg) Wound Laterality: Right, Lateral, Anterior Cleanser Soap and Water Discharge Instruction: May shower and wash wound with dial antibacterial soap and water prior to dressing change. Wound Cleanser Discharge Instruction: Cleanse the wound with wound cleanser prior to applying a clean dressing using gauze sponges, not tissue or cotton balls. Peri-Wound Care Zinc Oxide Ointment 30g tube Discharge Instruction: Apply Zinc Oxide to periwound with each dressing change Sween Lotion (Moisturizing lotion) Discharge Instruction: Apply moisturizing lotion as directed Topical Primary Dressing KerraCel Ag Gelling Fiber Dressing, 2x2 in (silver alginate) Discharge Instruction: Apply silver alginate to wound bed as instructed Secondary Dressing ABD Pad, 5x9 Discharge Instruction: Apply over primary dressing as directed. Zetuvit Plus 4x8 in Discharge Instruction: Apply over primary dressing as directed. Secured With Compression Wrap CoFlex TLC XL 2-layer Compression System 4x7 (in/yd) Discharge Instruction: Apply CoFlex 2-layer compression as directed. (alt for 4 layer) Compression Stockings Add-Ons Electronic  Signature(s) Signed: 04/02/2022 5:57:27 PM By: Levan Hurst RN, BSN Entered By: Levan Hurst on 04/02/2022 09:42:15 -------------------------------------------------------------------------------- Vitals Details Patient Name: [name]: [name] Banker [name] 04/02/2022 9:00 A M Medical Record Number: [id number] Patient Account Number: [id number] Date of Birth/Sex: Treating RN: [birth year] (81 y.o. [name] Primary Care Benedicta Sultan: [name] Other Clinician: Referring Mervil Wacker: Treating Leeyah Heather/Extender: Benedict Needy Weeks in Treatment: 2 Vital Signs Time Taken: 09:21 Temperature (F): 98.0 Height (in): 69 Pulse (bpm): 70 Weight (lbs): 250 Respiratory Rate (breaths/min): 18 Body Mass Index (BMI): 36.9 Blood Pressure (mmHg): 150/68 Reference Range: 80 - 120 mg / dl Electronic Signature(s) Signed: 04/02/2022 5:57:27 PM By: Levan Hurst RN, BSN Entered By: Levan Hurst on 04/02/2022 09:34:02

## 2022-04-02 NOTE — Progress Notes (Signed)
LOGON, UTTECH ([id number]) Visit Report for 04/02/2022 Chief Complaint Document Details Patient Name: [name]: [name], [name] [name] 04/02/2022 9:00 A M Medical Record Number: [id number] Patient Account Number: [id number] Date of Birth/Sex: Treating RN: Dec 03, 1940 (81 y.o. [name] Primary Care Provider: [name] Other Clinician: Referring Provider: Treating Provider/Extender: Benedict Needy Weeks in Treatment: 2 Information Obtained from: Patient Chief Complaint 02/07/2021; patient is here for review of a wound on the tip of his right great toe 03/19/2022: re-opening of the right great toe wound, with additional wounds on medial aspect of right great toe and right anterior tibial surface Electronic Signature(s) Signed: 04/02/2022 10:03:20 AM By: Fredirick Maudlin MD FACS Entered By: Fredirick Maudlin on 04/02/2022 10:03:19 -------------------------------------------------------------------------------- Debridement Details [name] 04/02/2022 9:00 A M Medical Record Number: [id number] Patient Account Number: [id number] Date of Birth/Sex: Treating RN: 11-07-1941 (81 y.o. [name] Primary Care Provider: [name] Other Clinician: Referring Provider: Treating Provider/Extender: Benedict Needy Weeks in Treatment: 2 Debridement Performed for Assessment: Wound #2 Right,Anterior Lower Leg Performed By: Physician Fredirick Maudlin, MD Debridement Type: Debridement Severity of Tissue Pre Debridement: Fat layer exposed Level of Consciousness (Pre-procedure): Awake and Alert Pre-procedure Verification/Time Out Yes - 09:48 Taken: Start Time: 09:48 T Area Debrided (L x W): otal 1.7 (cm) x 2 (cm) = 3.4 (cm) Tissue and other material debrided: Non-Viable, Slough, Slough Level: Non-Viable Tissue Debridement Description: Selective/Open Wound Instrument: Curette Bleeding:  Minimum Hemostasis Achieved: Pressure End Time: 09:49 Procedural Pain: 0 Post Procedural Pain: 0 Response to Treatment: Procedure was tolerated well Level of Consciousness (Post- Awake and Alert procedure): Post Debridement Measurements of Total Wound Length: (cm) 1.7 Width: (cm) 2 Depth: (cm) 0.1 Volume: (cm) 0.267 Character of Wound/Ulcer Post Debridement: Improved Severity of Tissue Post Debridement: Fat layer exposed Post Procedure Diagnosis Same as Pre-procedure Electronic Signature(s) Signed: 04/02/2022 11:45:05 AM By: Fredirick Maudlin MD FACS Signed: 04/02/2022 5:57:27 PM By: Levan Hurst RN, BSN Entered By: Levan Hurst on 04/02/2022 09:50:50 -------------------------------------------------------------------------------- Debridement Details [name] 04/02/2022 9:00 A M Medical Record Number: [id number] Patient Account Number: [id number] Date of Birth/Sex: Treating RN: 04/13/1941 (81 y.o. [name] Primary Care Provider: [name] Other Clinician: Referring Provider: Treating Provider/Extender: Benedict Needy Weeks in Treatment: 2 Debridement Performed for Assessment: Wound #5 Right,Distal,Anterior Lower Leg Performed By: Physician Fredirick Maudlin, MD Debridement Type: Debridement Severity of Tissue Pre Debridement: Fat layer exposed Level of Consciousness (Pre-procedure): Awake and Alert Pre-procedure Verification/Time Out Yes - 09:48 Taken: Start Time: 09:49 T Area Debrided (L x W): otal 1.5 (cm) x 0.8 (cm) = 1.2 (cm) Tissue and other material debrided: Non-Viable, Slough, Slough Level: Non-Viable Tissue Debridement Description: Selective/Open Wound Instrument: Curette Bleeding: Minimum Hemostasis Achieved: Pressure End Time: 09:50 Procedural Pain: 0 Post Procedural Pain: 0 Response to Treatment: Procedure was tolerated well Level of Consciousness (Post- Awake and  Alert procedure): Post Debridement Measurements of Total Wound Length: (cm) 1.5 Width: (cm) 0.8 Depth: (cm) 0.1 Volume: (cm) 0.094 Character of Wound/Ulcer Post Debridement: Improved Severity of Tissue Post Debridement: Fat layer exposed Post Procedure Diagnosis Same as Pre-procedure Electronic Signature(s) Signed: 04/02/2022 11:45:05 AM By: Fredirick Maudlin MD FACS Signed: 04/02/2022 5:57:27 PM By: Levan Hurst RN, BSN Entered By: Levan Hurst on 04/02/2022 09:51:46 -------------------------------------------------------------------------------- Debridement Details [name] 04/02/2022 9:00 A M Medical  Record Number: [id number] Patient Account Number: [id number] Date of Birth/Sex: Treating RN: [birth year] (81 y.o. [name] Primary Care Provider: [name] Other Clinician: Referring Provider: Treating Provider/Extender: Benedict Needy Weeks in Treatment: 2 Debridement Performed for Assessment: Wound #4 Right,Distal T Great oe Performed By: Physician Fredirick Maudlin, MD Debridement Type: Debridement Level of Consciousness (Pre-procedure): Awake and Alert Pre-procedure Verification/Time Out Yes - 09:48 Taken: Start Time: 09:51 T Area Debrided (L x W): otal 0.5 (cm) x 0.5 (cm) = 0.25 (cm) Tissue and other material debrided: Non-Viable, Callus, Eschar Level: Non-Viable Tissue Debridement Description: Selective/Open Wound Instrument: Curette Bleeding: Minimum Hemostasis Achieved: Pressure End Time: 09:52 Procedural Pain: 0 Post Procedural Pain: 0 Response to Treatment: Procedure was tolerated well Level of Consciousness (Post- Awake and Alert procedure): Post Debridement Measurements of Total Wound Length: (cm) 0.1 Width: (cm) 0.1 Depth: (cm) 0.1 Volume: (cm) 0.001 Character of Wound/Ulcer Post Debridement: Improved Post Procedure Diagnosis Same as Pre-procedure Electronic  Signature(s) Signed: 04/02/2022 11:45:05 AM By: Fredirick Maudlin MD FACS Signed: 04/02/2022 5:57:27 PM By: Levan Hurst RN, BSN Entered By: Levan Hurst on 04/02/2022 09:52:26 -------------------------------------------------------------------------------- HPI Details [name] 04/02/2022 9:00 A M Medical Record Number: [id number] Patient Account Number: [id number] Date of Birth/Sex: Treating RN: Aug 31, 1941 (81 y.o. [name] Primary Care Provider: [name] Other Clinician: Referring Provider: Treating Provider/Extender: Benedict Needy Weeks in Treatment: 2 History of Present Illness HPI Description: ADMISSION 02/07/2021 This is a 81 year old man who is not a diabetic "prediabetic" who comes in today letter asking [place] to look at an area on his right first toe tip that has been there about a year. He has been getting this dressed that [employer] and he had Xeroform on this with gauze. The patient is not exactly sure how this came about but he is not able to dress the wound himself because of limitations due to what sounds like spinal stenosis and pain. He had a wound on the ankle as well but that healed over. He was seen by vascular in May 2021. This was related to a right great toe wound. He had an angiogram that was completely normal on both sides good perfusion right into the feet. He also looks like he has lymphedema and he has compression stockings but he cannot get them on and off so he essentially leaves he is on even when he showering Past medical history includes prediabetes, low back pain secondary I think the lumbar spinal stenosis has been offered a decompressive laminectomy but he has not gone forth with this. 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 ABI was noncompressible in our clinic on the right right first toe. 4/7; patient I admitted the  clinic last week. He has an area on the tip of his right first toe. We cleaned this off last week and have been using silver collagen. He is in the same crocs today that he wore last week and I told him not to use. For 1 reason or another he could not handle a surgical sandal. 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 He is not a diabetic 4/12; right first toe wound chronic. We have been using silver collagen. Once again he comes in then the same pair of old crocs that he comes in every week. 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 His x-ray suggested suspicious for osteomyelitis with bone irregularity at the tuft of the right great toe 4/21; patient has been using silver collagen every other day to the wound on his right great toe. He continues to wear crocs. He has no complaints or issues today. 5/5; 2-week follow-up. 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 Mild soft tissue edema with enhancement at the distal aspect of the great toe suggestive of cellulitis. Fortunately the wound actually looks better. Has been using Hydrofera Blue 5/12; started him on Augmentin last week 875 twice daily he is tolerating this well. Last creatinine I see is 1.77 in early April. We are using Hydrofera Blue to the wound which actually looks better today. 5/17; tolerating the Augmentin well. He says the wound is actually been hurting him episodically. Actually the surface of this looks quite good. He did not get his lab work done. We are using [facility] he is changing this himself 6/2; still taking Augmentin he should be rounding into his last week. This was empirically for underlying osteomyelitis. The area on the tip of his toe is still open still with some depth but no palpable bone. We have been using Hydrofera Blue. Surface area of the wound has not been making much  improvement Is work was essentially unremarkable except for a creatinine of 1.79. Reason for his renal insufficiency is not clear he is not a diabetic does not have hypertension however he does take NSAIDs and Lasix I wonder if this is the issue here. Note that his creatinine on 11/12/2018 was 1.45. Platelet count slightly low at 143 however his inflammatory markers were really in the normal range at 10 and 2 sedimentation rate and C-reactive protein respectively. 6/9; he apparently had another 2 weeks of Augmentin after we look that this last time. Therefore he should be coming into his last week next week. We are using polymen on the tip of the toe. He cannot get down to do the dressing so he comes in for a nurse visit. He comes in in crocs I have warned him against this although he says he is using a different shoe for most of the day he works in [employer]. 6/16; he comes back in in crocs. I have spoken him about this before. His areas on the tip of his toe this actually looks quite good we have been using polymen. He has completed his antibiotics which we empirically gave him for underlying osteomyelitis. Everything looks better to me in spite of his noncompliance with footwear recommendation 7/11; patient has been followed through nurse visits. He was last seen by provider on 6/16. He reports improvement to the wound with the use of PolyMem. He denies signs of infection. 7/21; the patient arrives with the area on the tip of his right great toe completely healed. He has the same proximal on that I see every time. I treated him empirically with Augmentin for osteomyelitis. He needs to be vigilant about this. READMISSION 03/19/2022 The patient returns with a reopening of the right great toe wound. Apparently this occurred in around October of last year. He was followed by podiatry for some time. They have performed x-rays but unfortunately, we do not have access to them or the results. No report  as to whether or not he might have osteomyelitis. In addition, he has a new wound on the  medial aspect of the right great toe as well as the anterior tibial surface of his right lower leg. He does state that he was wearing compression stockings when this tibial wound occurred. He continues to wear crocs. 04/02/2022: For some reason, the patient has not been seen in 2 weeks. He says that he had to reschedule his visit. Therefore his Coflex compression wrap was left in situ for 14 days. It appears that his skin became macerated and he has opened up several new superficial wounds on his right anterior tibial surface. We did get an x-ray to evaluate for osteomyelitis and none was seen. All of the pre-existing wounds are smaller today and the new wounds look like they are limited to just the breakdown of skin. Electronic Signature(s) Signed: 04/02/2022 10:04:31 AM By: Fredirick Maudlin MD FACS Entered By: Fredirick Maudlin on 04/02/2022 10:04:30 -------------------------------------------------------------------------------- Physical Exam Details [name] 04/02/2022 9:00 A M Medical Record Number: [id number] Patient Account Number: [id number] Date of Birth/Sex: Treating RN: 1941-01-21 (81 y.o. [name] Primary Care Provider: [name] Other Clinician: Referring Provider: Treating Provider/Extender: Benedict Needy Weeks in Treatment: 2 Constitutional Slightly hypertensive. . . . No acute distress. Respiratory Normal work of breathing on room air. Notes 03/23/2022: The pre-existing anterior tibial surface wound is smaller today with some good granulation tissue forming. The new wounds are all fairly superficial and appear to be limited to just the breakdown of skin. There is a little bit of slough accumulation on all of the surfaces. The ulcer on the medial portion of his right great toe is smaller as well, with just a little  bit of slough. There is some eschar and callus on the tip of his toe. Underlying this, there is just a pinhole opening in the skin. Electronic Signature(s) Signed: 04/02/2022 10:06:00 AM By: Fredirick Maudlin MD FACS Entered By: Fredirick Maudlin on 04/02/2022 10:06:00 -------------------------------------------------------------------------------- Physician Orders Details [name] 04/02/2022 9:00 A M Medical Record Number: [id number] Patient Account Number: [id number] Date of Birth/Sex: Treating RN: 1941-09-19 (81 y.o. [name] Primary Care Provider: [name] Other Clinician: Referring Provider: Treating Provider/Extender: Benedict Needy Weeks in Treatment: 2 Verbal / Phone Orders: No Diagnosis Coding ICD-10 Coding Code Description [phone] Non-pressure chronic ulcer of other part of right foot with fat layer exposed L97.812 Non-pressure chronic ulcer of other part of right lower leg with fat layer exposed I87.2 Venous insufficiency (chronic) (peripheral) N18.30 Chronic kidney disease, stage 3 unspecified E66.01 Morbid (severe) obesity due to excess calories R73.03 Prediabetes Follow-up Appointments ppointment in 1 week. - Dr. Celine Ahr - Room 4 - Thursday 6/1 at 10:00 Return A Bathing/ Shower/ Hygiene May shower with protection but do not get wound dressing(s) wet. - Ok to use cast protector Edema Control - Lymphedema / SCD / Other Elevate legs to the level of the heart or above for 30 minutes daily and/or when sitting, a frequency of: - throughout the day Avoid standing for long periods of time. Exercise regularly Moisturize legs daily. - left leg Compression stocking or Garment 20-30 mm/Hg pressure to: - left leg daily Wound Treatment Wound #2 - Lower Leg Wound Laterality: Right, Anterior Cleanser: Soap and Water 1 x Per Week Discharge Instructions: May shower and wash wound with dial antibacterial soap  and water prior to dressing change. Cleanser: Wound Cleanser 1 x Per Week Discharge Instructions: Cleanse the wound  with wound cleanser prior to applying a clean dressing using gauze sponges, not tissue or cotton balls. Peri-Wound Care: Zinc Oxide Ointment 30g tube 1 x Per Week Discharge Instructions: Apply Zinc Oxide to periwound with each dressing change Peri-Wound Care: Sween Lotion (Moisturizing lotion) 1 x Per Week Discharge Instructions: Apply moisturizing lotion as directed Prim Dressing: KerraCel Ag Gelling Fiber Dressing, 2x2 in (silver alginate) 1 x Per Week ary Discharge Instructions: Apply silver alginate to wound bed as instructed Secondary Dressing: ABD Pad, 5x9 1 x Per Week Discharge Instructions: Apply over primary dressing as directed. Secondary Dressing: Zetuvit Plus 4x8 in 1 x Per Week Discharge Instructions: Apply over primary dressing as directed. Compression Wrap: CoFlex TLC XL 2-layer Compression System 4x7 (in/yd) 1 x Per Week Discharge Instructions: Apply CoFlex 2-layer compression as directed. (alt for 4 layer) Wound #3 - T Great oe Wound Laterality: Right, Lateral Cleanser: Soap and Water 1 x Per Week Discharge Instructions: May shower and wash wound with dial antibacterial soap and water prior to dressing change. Cleanser: Wound Cleanser 1 x Per Week Discharge Instructions: Cleanse the wound with wound cleanser prior to applying a clean dressing using gauze sponges, not tissue or cotton balls. Prim Dressing: KerraCel Ag Gelling Fiber Dressing, 2x2 in (silver alginate) 1 x Per Week ary Discharge Instructions: Apply silver alginate to wound bed as instructed Secondary Dressing: Woven Gauze Sponges 2x2 in 1 x Per Week Discharge Instructions: Apply over primary dressing as directed. Secured With: Child psychotherapist, Sterile 2x75 (in/in) 1 x Per Week Discharge Instructions: Secure with stretch gauze as directed. Secured With: 62M Medipore Public affairs consultant  Surgical T 2x10 (in/yd) 1 x Per Week ape Discharge Instructions: Secure with tape as directed. Wound #4 - T Great oe Wound Laterality: Right, Distal Cleanser: Soap and Water 1 x Per Week Discharge Instructions: May shower and wash wound with dial antibacterial soap and water prior to dressing change. Cleanser: Wound Cleanser 1 x Per Week Discharge Instructions: Cleanse the wound with wound cleanser prior to applying a clean dressing using gauze sponges, not tissue or cotton balls. Prim Dressing: KerraCel Ag Gelling Fiber Dressing, 2x2 in (silver alginate) 1 x Per Week ary Discharge Instructions: Apply silver alginate to wound bed as instructed Secondary Dressing: Drawtex 4x4 in 1 x Per Week Discharge Instructions: Apply over primary dressing as directed. Secondary Dressing: Woven Gauze Sponges 2x2 in 1 x Per Week Discharge Instructions: Apply over primary dressing as directed. Secured With: Child psychotherapist, Sterile 2x75 (in/in) 1 x Per Week Discharge Instructions: Secure with stretch gauze as directed. Secured With: 62M Medipore Public affairs consultant Surgical T 2x10 (in/yd) 1 x Per Week ape Discharge Instructions: Secure with tape as directed. Wound #5 - Lower Leg Wound Laterality: Right, Anterior, Distal Cleanser: Soap and Water 1 x Per Week Discharge Instructions: May shower and wash wound with dial antibacterial soap and water prior to dressing change. Cleanser: Wound Cleanser 1 x Per Week Discharge Instructions: Cleanse the wound with wound cleanser prior to applying a clean dressing using gauze sponges, not tissue or cotton balls. Peri-Wound Care: Sween Lotion (Moisturizing lotion) 1 x Per Week Discharge Instructions: Apply moisturizing lotion as directed Prim Dressing: KerraCel Ag Gelling Fiber Dressing, 2x2 in (silver alginate) 1 x Per Week ary Discharge Instructions: Apply silver alginate to wound bed as instructed Secondary Dressing: ABD Pad, 5x9 1 x Per Week Discharge  Instructions: Apply over primary dressing as directed. Secondary Dressing: Zetuvit Plus 4x8 in 1 x Per Week  Discharge Instructions: Apply over primary dressing as directed. Compression Wrap: CoFlex TLC XL 2-layer Compression System 4x7 (in/yd) 1 x Per Week Discharge Instructions: Apply CoFlex 2-layer compression as directed. (alt for 4 layer) Wound #6 - Lower Leg Wound Laterality: Right, Lateral, Anterior Cleanser: Soap and Water 1 x Per Week Discharge Instructions: May shower and wash wound with dial antibacterial soap and water prior to dressing change. Cleanser: Wound Cleanser 1 x Per Week Discharge Instructions: Cleanse the wound with wound cleanser prior to applying a clean dressing using gauze sponges, not tissue or cotton balls. Peri-Wound Care: Zinc Oxide Ointment 30g tube 1 x Per Week Discharge Instructions: Apply Zinc Oxide to periwound with each dressing change Peri-Wound Care: Sween Lotion (Moisturizing lotion) 1 x Per Week Discharge Instructions: Apply moisturizing lotion as directed Prim Dressing: KerraCel Ag Gelling Fiber Dressing, 2x2 in (silver alginate) 1 x Per Week ary Discharge Instructions: Apply silver alginate to wound bed as instructed Secondary Dressing: ABD Pad, 5x9 1 x Per Week Discharge Instructions: Apply over primary dressing as directed. Secondary Dressing: Zetuvit Plus 4x8 in 1 x Per Week Discharge Instructions: Apply over primary dressing as directed. Compression Wrap: CoFlex TLC XL 2-layer Compression System 4x7 (in/yd) 1 x Per Week Discharge Instructions: Apply CoFlex 2-layer compression as directed. (alt for 4 layer) Electronic Signature(s) Signed: 04/02/2022 11:45:05 AM By: Fredirick Maudlin MD FACS Entered By: Fredirick Maudlin on 04/02/2022 10:06:10 -------------------------------------------------------------------------------- Problem List Details [name] 04/02/2022 9:00 A M Medical Record Number:  [id number] Patient Account Number: [id number] Date of Birth/Sex: Treating RN: 10-03-1941 (81 y.o. Jonette Eva, Briant Cedar Primary Care Provider: [name] Other Clinician: Referring Provider: Treating Provider/Extender: Benedict Needy Weeks in Treatment: 2 Active Problems ICD-10 Encounter Code Description Active Date MDM Diagnosis [phone] Non-pressure chronic ulcer of other part of right foot with fat layer exposed 03/19/2022 No Yes L97.812 Non-pressure chronic ulcer of other part of right lower leg with fat layer 03/19/2022 No Yes exposed I87.2 Venous insufficiency (chronic) (peripheral) 03/19/2022 No Yes N18.30 Chronic kidney disease, stage 3 unspecified 03/19/2022 No Yes E66.01 Morbid (severe) obesity due to excess calories 03/19/2022 No Yes R73.03 Prediabetes 03/19/2022 No Yes Inactive Problems Resolved Problems Electronic Signature(s) Signed: 04/02/2022 10:02:58 AM By: Fredirick Maudlin MD FACS Entered By: Fredirick Maudlin on 04/02/2022 10:02:58 -------------------------------------------------------------------------------- Progress Note Details [name] 04/02/2022 9:00 A M Medical Record Number: [id number] Patient Account Number: [id number] Date of Birth/Sex: Treating RN: Oct 23, 1941 (81 y.o. [name] Primary Care Provider: [name] Other Clinician: Referring Provider: Treating Provider/Extender: Benedict Needy Weeks in Treatment: 2 Subjective Chief Complaint Information obtained from Patient 02/07/2021; patient is here for review of a wound on the tip of his right great toe 03/19/2022: re-opening of the right great toe wound, with additional wounds on medial aspect of right great toe and right anterior tibial surface History of Present Illness (HPI) ADMISSION 02/07/2021 This is a 81 year old man who is not a diabetic "prediabetic" who comes in today letter asking [place] to look at an  area on his right first toe tip that has been there about a year. He has been getting this dressed that [employer] and he had Xeroform on this with gauze. The patient is not exactly sure how this came about but he is not able to dress the wound himself because of limitations due to what sounds like spinal stenosis and pain. He had a  wound on the ankle as well but that healed over. He was seen by vascular in May 2021. This was related to a right great toe wound. He had an angiogram that was completely normal on both sides good perfusion right into the feet. He also looks like he has lymphedema and he has compression stockings but he cannot get them on and off so he essentially leaves he is on even when he showering Past medical history includes prediabetes, low back pain secondary I think the lumbar spinal stenosis has been offered a decompressive laminectomy but he has not gone forth with this. 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 ABI was noncompressible in our clinic on the right right first toe. 4/7; patient I admitted the clinic last week. He has an area on the tip of his right first toe. We cleaned this off last week and have been using silver collagen. He is in the same crocs today that he wore last week and I told him not to use. For 1 reason or another he could not handle a surgical sandal. 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 He is not a diabetic 4/12; right first toe wound chronic. We have been using silver collagen. Once again he comes in then the same pair of old crocs that he comes in every week. 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 His x-ray suggested suspicious for osteomyelitis with bone irregularity at the tuft of the right great toe 4/21; patient has been using silver collagen every other day to the wound on his right great toe. He  continues to wear crocs. He has no complaints or issues today. 5/5; 2-week follow-up. 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 Mild soft tissue edema with enhancement at the distal aspect of the great toe suggestive of cellulitis. Fortunately the wound actually looks better. Has been using Hydrofera Blue 5/12; started him on Augmentin last week 875 twice daily he is tolerating this well. Last creatinine I see is 1.77 in early April. We are using Hydrofera Blue to the wound which actually looks better today. 5/17; tolerating the Augmentin well. He says the wound is actually been hurting him episodically. Actually the surface of this looks quite good. He did not get his lab work done. We are using [facility] he is changing this himself 6/2; still taking Augmentin he should be rounding into his last week. This was empirically for underlying osteomyelitis. The area on the tip of his toe is still open still with some depth but no palpable bone. We have been using Hydrofera Blue. Surface area of the wound has not been making much improvement Is work was essentially unremarkable except for a creatinine of 1.79. Reason for his renal insufficiency is not clear he is not a diabetic does not have hypertension however he does take NSAIDs and Lasix I wonder if this is the issue here. Note that his creatinine on 11/12/2018 was 1.45. Platelet count slightly low at 143 however his inflammatory markers were really in the normal range at 10 and 2 sedimentation rate and C-reactive protein respectively. 6/9; he apparently had another 2 weeks of Augmentin after we look that this last time. Therefore he should be coming into his last week next week. We are using polymen  on the tip of the toe. He cannot get down to do the dressing so he comes in for a nurse visit. He comes in in crocs I have warned him against this although he says he is using a different shoe  for most of the day he works in [employer]. 6/16; he comes back in in crocs. I have spoken him about this before. His areas on the tip of his toe this actually looks quite good we have been using polymen. He has completed his antibiotics which we empirically gave him for underlying osteomyelitis. Everything looks better to me in spite of his noncompliance with footwear recommendation 7/11; patient has been followed through nurse visits. He was last seen by provider on 6/16. He reports improvement to the wound with the use of PolyMem. He denies signs of infection. 7/21; the patient arrives with the area on the tip of his right great toe completely healed. He has the same proximal on that I see every time. I treated him empirically with Augmentin for osteomyelitis. He needs to be vigilant about this. READMISSION 03/19/2022 The patient returns with a reopening of the right great toe wound. Apparently this occurred in around October of last year. He was followed by podiatry for some time. They have performed x-rays but unfortunately, we do not have access to them or the results. No report as to whether or not he might have osteomyelitis. In addition, he has a new wound on the medial aspect of the right great toe as well as the anterior tibial surface of his right lower leg. He does state that he was wearing compression stockings when this tibial wound occurred. He continues to wear crocs. 04/02/2022: For some reason, the patient has not been seen in 2 weeks. He says that he had to reschedule his visit. Therefore his Coflex compression wrap was left in situ for 14 days. It appears that his skin became macerated and he has opened up several new superficial wounds on his right anterior tibial surface. We did get an x-ray to evaluate for osteomyelitis and none was seen. All of the pre-existing wounds are smaller today and the new wounds look like they are limited to just the breakdown of skin. Patient  History Information obtained from Patient. Family History Diabetes - Maternal Grandparents, Heart Disease - Father, Hypertension - Father, Stroke - Father, Thyroid Problems - Mother,Child, No family history of Cancer, Hereditary Spherocytosis, Kidney Disease, Lung Disease, Seizures, Tuberculosis. Social History Never smoker, Marital Status - Single, Alcohol Use - Rarely, Drug Use - No History, Caffeine Use - Daily - coffee. Medical History Eyes Patient has history of Cataracts - 2019 Ear/Nose/Mouth/Throat Patient has history of Chronic sinus problems/congestion Respiratory Patient has history of Asthma, Sleep Apnea Cardiovascular Patient has history of Hypertension, Peripheral Venous Disease Musculoskeletal Patient has history of Osteoarthritis Medical A Surgical History Notes nd Gastrointestinal GERD Genitourinary BPH Musculoskeletal Degenerative disc disease Neurologic Neuromuscular disorder Objective Constitutional Slightly hypertensive. No acute distress. Vitals Time Taken: 9:21 AM, Height: 69 in, Weight: 250 lbs, BMI: 36.9, Temperature: 98.0 F, Pulse: 70 bpm, Respiratory Rate: 18 breaths/min, Blood Pressure: 150/68 mmHg. Respiratory Normal work of breathing on room air. General Notes: 03/23/2022: The pre-existing anterior tibial surface wound is smaller today with some good granulation tissue forming. The new wounds are all fairly superficial and appear to be limited to just the breakdown of skin. There is a little bit of slough accumulation on all of the surfaces. The ulcer on the medial  portion of his right great toe is smaller as well, with just a little bit of slough. There is some eschar and callus on the tip of his toe. Underlying this, there is just a pinhole opening in the skin. Integumentary (Hair, Skin) Wound #2 status is Open. Original cause of wound was Blister. The date acquired was: 03/03/2022. The wound has been in treatment 2 weeks. The wound is located  on the Right,Anterior Lower Leg. The wound measures 1.7cm length x 2cm width x 0.1cm depth; 2.67cm^2 area and 0.267cm^3 volume. There is Fat Layer (Subcutaneous Tissue) exposed. There is no tunneling or undermining noted. There is a medium amount of serosanguineous drainage noted. The wound margin is flat and intact. There is large (67-100%) red, pink granulation within the wound bed. There is a small (1-33%) amount of necrotic tissue within the wound bed including Adherent Slough. Wound #3 status is Open. Original cause of wound was Blister. The date acquired was: 03/10/2022. The wound has been in treatment 2 weeks. The wound is located on the Right,Lateral T Great. The wound measures 0.5cm length x 0.6cm width x 0.1cm depth; 0.236cm^2 area and 0.024cm^3 volume. There is Fat oe Layer (Subcutaneous Tissue) exposed. There is no tunneling or undermining noted. There is a medium amount of serosanguineous drainage noted. The wound margin is flat and intact. There is large (67-100%) red granulation within the wound bed. There is no necrotic tissue within the wound bed. Wound #4 status is Open. Original cause of wound was Gradually Appeared. The date acquired was: 11/10/2021. The wound has been in treatment 2 weeks. The wound is located on the Right,Distal T [place]. The wound measures 0.1cm length x 0.1cm width x 0.1cm depth; 0.008cm^2 area and 0.001cm^3 volume. There oe is Fat Layer (Subcutaneous Tissue) exposed. There is no tunneling or undermining noted. There is a small amount of serosanguineous drainage noted. The wound margin is well defined and not attached to the wound base. There is no granulation within the wound bed. There is a large (67-100%) amount of necrotic tissue within the wound bed including Eschar. Wound #5 status is Open. Original cause of wound was Blister. The date acquired was: 04/02/2022. The wound is located on the Right,Distal,Anterior Lower Leg. The wound measures 1.5cm length x 0.8cm  width x 0.1cm depth; 0.942cm^2 area and 0.094cm^3 volume. There is Fat Layer (Subcutaneous Tissue) exposed. There is no tunneling or undermining noted. There is a medium amount of serous drainage noted. The wound margin is flat and intact. There is large (67-100%) pink, pale granulation within the wound bed. There is a small (1-33%) amount of necrotic tissue within the wound bed including Adherent Slough. Wound #6 status is Open. Original cause of wound was Blister. The date acquired was: 04/02/2022. The wound is located on the Right,Lateral,Anterior Lower Leg. The wound measures 0.7cm length x 0.5cm width x 0.1cm depth; 0.275cm^2 area and 0.027cm^3 volume. There is Fat Layer (Subcutaneous Tissue) exposed. There is no tunneling or undermining noted. There is a medium amount of serosanguineous drainage noted. The wound margin is flat and intact. There is large (67-100%) red, pink granulation within the wound bed. There is no necrotic tissue within the wound bed. 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 There was a Selective/Open Wound Non-Viable Tissue Debridement with a total area of 3.4 sq cm performed by Fredirick Maudlin, MD. With the following instrument(s): Curette to remove Non-Viable tissue/material. Material removed includes [facility]. No specimens were taken. A time out was conducted at 09:48, prior to the start of the procedure. A Minimum amount of bleeding was controlled with Pressure. The procedure was tolerated well  with a pain level of 0 throughout and a pain level of 0 following the procedure. Post Debridement Measurements: 1.7cm length x 2cm width x 0.1cm depth; 0.267cm^3 volume. Character of Wound/Ulcer Post Debridement is improved. Severity of Tissue Post Debridement is: Fat layer exposed. Post procedure Diagnosis Wound #2: Same as Pre-Procedure Pre-procedure diagnosis of Wound #2 is a Venous Leg Ulcer located on the Right,Anterior Lower Leg . There was a Double Layer Compression Therapy Procedure by Levan Hurst, RN. 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 There was a Selective/Open Wound Non- oe Viable Tissue Debridement with a total area of 0.25 sq cm performed by Fredirick Maudlin, MD. With the following instrument(s): Curette to remove Non-Viable tissue/material. Material removed includes Eschar and Callus and. No specimens were taken. A time out was conducted at 09:48, prior to the start of the procedure. A Minimum amount of bleeding was controlled with Pressure. The procedure was tolerated well with a pain level of 0 throughout and a pain level of 0 following the procedure. Post Debridement Measurements: 0.1cm length x 0.1cm width x 0.1cm depth; 0.001cm^3 volume. Character of Wound/Ulcer Post Debridement is improved. Post procedure Diagnosis Wound #4: Same as Pre-Procedure Wound #5 Pre-procedure diagnosis of Wound #5 is a Venous Leg Ulcer located on the Right,Distal,Anterior Lower Leg .Severity of Tissue Pre Debridement is: Fat layer exposed. There was a Selective/Open Wound Non-Viable Tissue Debridement with a total area of 1.2 sq cm performed by Fredirick Maudlin, MD. With the following instrument(s): Curette to remove Non-Viable tissue/material. Material removed includes [facility]. No specimens were taken. A time out was conducted at 09:48, prior to the start of  the procedure. A Minimum amount of bleeding was controlled with Pressure. The procedure was tolerated well with a pain level of 0 throughout and a pain level of 0 following the procedure. Post Debridement Measurements: 1.5cm length x 0.8cm width x 0.1cm depth; 0.094cm^3 volume. Character of Wound/Ulcer Post Debridement is improved. Severity of Tissue Post Debridement is: Fat layer exposed. Post procedure Diagnosis Wound #5: Same as Pre-Procedure Pre-procedure diagnosis of Wound #5 is a Venous Leg Ulcer located on the Right,Distal,Anterior Lower Leg . There was a Double Layer Compression Therapy Procedure by Levan Hurst, RN. Post procedure Diagnosis Wound #5: Same as Pre-Procedure Wound #6 Pre-procedure diagnosis of Wound #6 is a Venous Leg Ulcer located on the Right,Lateral,Anterior Lower Leg . There was a Double Layer Compression Therapy Procedure by Levan Hurst, RN. Post procedure Diagnosis Wound #6: Same as Pre-Procedure Plan Follow-up Appointments: Return Appointment in 1 week. - Dr. Celine Ahr - Room 4 - Thursday 6/1 at 10:00 Bathing/ Shower/ Hygiene: May shower with protection but do not get wound dressing(s) wet. - Ok to use cast protector Edema Control - Lymphedema / SCD / Other: Elevate legs to the level of the heart or above for 30 minutes  daily and/or when sitting, a frequency of: - throughout the day Avoid standing for long periods of time. Exercise regularly Moisturize legs daily. - left leg Compression stocking or Garment 20-30 mm/Hg pressure to: - left leg daily WOUND #2: - Lower Leg Wound Laterality: Right, Anterior Cleanser: Soap and Water 1 x Per Week/ Discharge Instructions: May shower and wash wound with dial antibacterial soap and water prior to dressing change. Cleanser: Wound Cleanser 1 x Per Week/ Discharge Instructions: Cleanse the wound with wound cleanser prior to applying a clean dressing using gauze sponges, not tissue or cotton balls. Peri-Wound Care: Zinc  Oxide Ointment 30g tube 1 x Per Week/ Discharge Instructions: Apply Zinc Oxide to periwound with each dressing change Peri-Wound Care: Sween Lotion (Moisturizing lotion) 1 x Per Week/ Discharge Instructions: Apply moisturizing lotion as directed Prim Dressing: KerraCel Ag Gelling Fiber Dressing, 2x2 in (silver alginate) 1 x Per Week/ ary Discharge Instructions: Apply silver alginate to wound bed as instructed Secondary Dressing: ABD Pad, 5x9 1 x Per Week/ Discharge Instructions: Apply over primary dressing as directed. Secondary Dressing: Zetuvit Plus 4x8 in 1 x Per Week/ Discharge Instructions: Apply over primary dressing as directed. Com pression Wrap: CoFlex TLC XL 2-layer Compression System 4x7 (in/yd) 1 x Per Week/ Discharge Instructions: Apply CoFlex 2-layer compression as directed. (alt for 4 layer) WOUND #3: - T Great Wound Laterality: Right, Lateral oe Cleanser: Soap and Water 1 x Per Week/ Discharge Instructions: May shower and wash wound with dial antibacterial soap and water prior to dressing change. Cleanser: Wound Cleanser 1 x Per Week/ Discharge Instructions: Cleanse the wound with wound cleanser prior to applying a clean dressing using gauze sponges, not tissue or cotton balls. Prim Dressing: KerraCel Ag Gelling Fiber Dressing, 2x2 in (silver alginate) 1 x Per Week/ ary Discharge Instructions: Apply silver alginate to wound bed as instructed Secondary Dressing: Woven Gauze Sponges 2x2 in 1 x Per Week/ Discharge Instructions: Apply over primary dressing as directed. Secured With: Child psychotherapist, Sterile 2x75 (in/in) 1 x Per Week/ Discharge Instructions: Secure with stretch gauze as directed. Secured With: [id number] Medipore Public affairs consultant Surgical T 2x10 (in/yd) 1 x Per Week/ ape Discharge Instructions: Secure with tape as directed. WOUND #4: - T Great Wound Laterality: Right, Distal oe Cleanser: Soap and Water 1 x Per Week/ Discharge Instructions: May shower and  wash wound with dial antibacterial soap and water prior to dressing change. Cleanser: Wound Cleanser 1 x Per Week/ Discharge Instructions: Cleanse the wound with wound cleanser prior to applying a clean dressing using gauze sponges, not tissue or cotton balls. Prim Dressing: KerraCel Ag Gelling Fiber Dressing, 2x2 in (silver alginate) 1 x Per Week/ ary Discharge Instructions: Apply silver alginate to wound bed as instructed Secondary Dressing: Drawtex 4x4 in 1 x Per Week/ Discharge Instructions: Apply over primary dressing as directed. Secondary Dressing: Woven Gauze Sponges 2x2 in 1 x Per Week/ Discharge Instructions: Apply over primary dressing as directed. Secured With: Child psychotherapist, Sterile 2x75 (in/in) 1 x Per Week/ Discharge Instructions: Secure with stretch gauze as directed. Secured With: [id number] Medipore Public affairs consultant Surgical T 2x10 (in/yd) 1 x Per Week/ ape Discharge Instructions: Secure with tape as directed. WOUND #5: - Lower Leg Wound Laterality: Right, Anterior, Distal Cleanser: Soap and Water 1 x Per Week/ Discharge Instructions: May shower and wash wound with dial antibacterial soap and water prior to dressing change. Cleanser: Wound Cleanser 1 x Per Week/ Discharge Instructions: Cleanse the wound with wound  cleanser prior to applying a clean dressing using gauze sponges, not tissue or cotton balls. Peri-Wound Care: Sween Lotion (Moisturizing lotion) 1 x Per Week/ Discharge Instructions: Apply moisturizing lotion as directed Prim Dressing: KerraCel Ag Gelling Fiber Dressing, 2x2 in (silver alginate) 1 x Per Week/ ary Discharge Instructions: Apply silver alginate to wound bed as instructed Secondary Dressing: ABD Pad, 5x9 1 x Per Week/ Discharge Instructions: Apply over primary dressing as directed. Secondary Dressing: Zetuvit Plus 4x8 in 1 x Per Week/ Discharge Instructions: Apply over primary dressing as directed. Com pression Wrap: CoFlex TLC XL 2-layer  Compression System 4x7 (in/yd) 1 x Per Week/ Discharge Instructions: Apply CoFlex 2-layer compression as directed. (alt for 4 layer) WOUND #6: - Lower Leg Wound Laterality: Right, Lateral, Anterior Cleanser: Soap and Water 1 x Per Week/ Discharge Instructions: May shower and wash wound with dial antibacterial soap and water prior to dressing change. Cleanser: Wound Cleanser 1 x Per Week/ Discharge Instructions: Cleanse the wound with wound cleanser prior to applying a clean dressing using gauze sponges, not tissue or cotton balls. Peri-Wound Care: Zinc Oxide Ointment 30g tube 1 x Per Week/ Discharge Instructions: Apply Zinc Oxide to periwound with each dressing change Peri-Wound Care: Sween Lotion (Moisturizing lotion) 1 x Per Week/ Discharge Instructions: Apply moisturizing lotion as directed Prim Dressing: KerraCel Ag Gelling Fiber Dressing, 2x2 in (silver alginate) 1 x Per Week/ ary Discharge Instructions: Apply silver alginate to wound bed as instructed Secondary Dressing: ABD Pad, 5x9 1 x Per Week/ Discharge Instructions: Apply over primary dressing as directed. Secondary Dressing: Zetuvit Plus 4x8 in 1 x Per Week/ Discharge Instructions: Apply over primary dressing as directed. Com pression Wrap: CoFlex TLC XL 2-layer Compression System 4x7 (in/yd) 1 x Per Week/ Discharge Instructions: Apply CoFlex 2-layer compression as directed. (alt for 4 layer) 03/23/2022: The pre-existing anterior tibial surface wound is smaller today with some good granulation tissue forming. The new wounds are all fairly superficial and appear to be limited to just the breakdown of skin. There is a little bit of slough accumulation on all of the surfaces. The ulcer on the medial portion of his right great toe is smaller as well, with just a little bit of slough. There is some eschar and callus on the tip of his toe. Underlying this, there is just a pinhole opening in the skin. I used a curette to debride the  slough from the anterior tibial wounds as well as the medial great toe wound. I also debrided the callus and eschar off of the tip of his great toe. I think the breakdown of his skin occurred due to maceration from not having his wrap changed in a timely fashion. We will take some measures to help protect his skin from excess moisture today including periwound zinc oxide and Zetuvit dressing along with the silver alginate and 4-layer or Coflex compression. Follow-up in 1 week. Electronic Signature(s) Signed: 04/02/2022 10:07:28 AM By: Fredirick Maudlin MD FACS Entered By: Fredirick Maudlin on 04/02/2022 10:07:28 -------------------------------------------------------------------------------- HxROS Details [name] 04/02/2022 9:00 A M Medical Record Number: [id number] Patient Account Number: [id number] Date of Birth/Sex: Treating RN: [birth year] (81 y.o. [name] Primary Care Provider: [name] Other Clinician: Referring Provider: Treating Provider/Extender: Benedict Needy Weeks in Treatment: 2 Information Obtained From Patient Eyes Medical History: Positive for: Cataracts - 2019 Ear/Nose/Mouth/Throat Medical History: Positive for: Chronic sinus problems/congestion Respiratory Medical History: Positive for: Asthma; Sleep Apnea Cardiovascular  Medical History: Positive for: Hypertension; Peripheral Venous Disease Gastrointestinal Medical History: Past Medical History Notes: GERD Genitourinary Medical History: Past Medical History Notes: BPH Musculoskeletal Medical History: Positive for: Osteoarthritis Past Medical History Notes: Degenerative disc disease Neurologic Medical History: Past Medical History Notes: Neuromuscular disorder HBO Extended History Items Ear/Nose/Mouth/Throat: Eyes: Chronic sinus Cataracts problems/congestion Immunizations Pneumococcal Vaccine: Received Pneumococcal  Vaccination: Yes Received Pneumococcal Vaccination On or After 60th Birthday: No Implantable Devices None Family and Social History Cancer: No; Diabetes: Yes - Maternal Grandparents; Heart Disease: Yes - Father; Hereditary Spherocytosis: No; Hypertension: Yes - Father; Kidney Disease: No; Lung Disease: No; Seizures: No; Stroke: Yes - Father; Thyroid Problems: Yes - Mother,Child; Tuberculosis: No; Never smoker; Marital Status - Single; Alcohol Use: Rarely; Drug Use: No History; Caffeine Use: Daily - coffee; Financial Concerns: No; Food, Clothing or Shelter Needs: No; Support System Lacking: No; Transportation Concerns: No Electronic Signature(s) Signed: 04/02/2022 11:45:05 AM By: Fredirick Maudlin MD FACS Signed: 04/02/2022 5:57:27 PM By: Levan Hurst RN, BSN Entered By: Fredirick Maudlin on 04/02/2022 10:04:37 -------------------------------------------------------------------------------- SuperBill Details [name] 04/02/2022 Medical Record Number: [id number] Patient Account Number: [id number] Date of Birth/Sex: Treating RN: 05/11/1941 (81 y.o. [name] Primary Care Provider: [name] Other Clinician: Referring Provider: Treating Provider/Extender: Benedict Needy Weeks in Treatment: 2 Diagnosis Coding ICD-10 Codes Code Description [phone] Non-pressure chronic ulcer of other part of right foot with fat layer exposed L97.812 Non-pressure chronic ulcer of other part of right lower leg with fat layer exposed I87.2 Venous insufficiency (chronic) (peripheral) N18.30 Chronic kidney disease, stage 3 unspecified E66.01 Morbid (severe) obesity due to excess calories R73.03 Prediabetes Facility Procedures CPT4 Code: [id number] Description: [phone] - DEBRIDE WOUND 1ST 20 SQ CM OR < ICD-10 Diagnosis Description L97.512 Non-pressure chronic ulcer of other part of right foot with fat layer exposed L97.812 Non-pressure  chronic ulcer of other part of right lower leg with fat layer  expos Modifier: ed Quantity: 1 Physician Procedures : CPT4 Code Description Modifier [id number] - WC PHYS LEVEL 3 - EST PT 25 ICD-10 Diagnosis Description 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

## 2022-04-03 ENCOUNTER — Ambulatory Visit: Payer: Medicare Other | Admitting: Pulmonary Disease

## 2022-04-06 NOTE — Procedures (Signed)
Lumbosacral Transforaminal Epidural Steroid Injection - Sub-Pedicular Approach with Fluoroscopic Guidance  Patient: [name]      Date of Birth: 04-Jul-1941 MRN: [id number] PCP: Cari Caraway, MD      Visit Date: 03/20/2022   Universal Protocol:    Date/Time: 03/20/2022  Consent Given By: the patient  Position: PRONE  Additional Comments: Vital signs were monitored before and after the procedure. Patient was prepped and draped in the usual sterile fashion. The correct patient, procedure, and site was verified.   Injection Procedure Details:   Procedure diagnoses: Lumbar radiculopathy [M54.16]    Meds Administered:  Meds ordered this encounter  Medications   methylPREDNISolone acetate (DEPO-MEDROL) injection 80 mg    Laterality: Bilateral  Location/Site: L4  Needle:5.0 in., 22 ga.  Short bevel or Quincke spinal needle  Needle Placement: Transforaminal  Findings:    -Comments: Excellent flow of contrast along the nerve, nerve root and into the epidural space.  Procedure Details: After squaring off the end-plates to get a true AP view, the C-arm was positioned so that an oblique view of the foramen as noted above was visualized. The target area is just inferior to the "nose of the scotty dog" or sub pedicular. The soft tissues overlying this structure were infiltrated with 2-3 ml. of 1% Lidocaine without Epinephrine.  The spinal needle was inserted toward the target using a "trajectory" view along the fluoroscope beam.  Under AP and lateral visualization, the needle was advanced so it did not puncture dura and was located close the 6 O'Clock position of the pedical in AP tracterory. Biplanar projections were used to confirm position. Aspiration was confirmed to be negative for CSF and/or blood. A 1-2 ml. volume of Isovue-250 was injected and flow of contrast was noted at each level. Radiographs were obtained for documentation purposes.   After attaining the desired  flow of contrast documented above, a 0.5 to 1.0 ml test dose of 0.25% Marcaine was injected into each respective transforaminal space.  The patient was observed for 90 seconds post injection.  After no sensory deficits were reported, and normal lower extremity motor function was noted,   the above injectate was administered so that equal amounts of the injectate were placed at each foramen (level) into the transforaminal epidural space.   Additional Comments:  The patient tolerated the procedure well Dressing: 2 x 2 sterile gauze and Band-Aid    Post-procedure details: Patient was observed during the procedure. Post-procedure instructions were reviewed.  Patient left the clinic in stable condition.

## 2022-04-06 NOTE — Progress Notes (Signed)
[name] - 81 y.o. male MRN [id number]  Date of birth: May 24, 1941  Office Visit Note: Visit Date: 03/20/2022 PCP: Cari Caraway, MD Referred by: Cari Caraway, MD  Subjective: Chief Complaint  Patient presents with   Lower Back - Pain   HPI:  [name] is a 81 y.o. male who comes in today at the request of Barnet Pall, FNP for planned Bilateral L4-5 Lumbar Transforaminal epidural steroid injection with fluoroscopic guidance.  The patient has failed conservative care including home exercise, medications, time and activity modification.  This injection will be diagnostic and hopefully therapeutic.  Please see requesting physician notes for further details and justification.  Referring provider: Dr. Legrand Como Hilts  ROS Otherwise per HPI.  Assessment & Plan: Visit Diagnoses:    ICD-10-CM   1. Lumbar radiculopathy  M54.16 XR C-ARM NO REPORT    Epidural Steroid injection    methylPREDNISolone acetate (DEPO-MEDROL) injection 80 mg      Plan: No additional findings.   Meds & Orders:  Meds ordered this encounter  Medications   methylPREDNISolone acetate (DEPO-MEDROL) injection 80 mg    Orders Placed This Encounter  Procedures   XR C-ARM NO REPORT   Epidural Steroid injection    Follow-up: Return if symptoms worsen or fail to improve.   Procedures: No procedures performed  Lumbosacral Transforaminal Epidural Steroid Injection - Sub-Pedicular Approach with Fluoroscopic Guidance  Patient: [name]      Date of Birth: 1940-12-14 MRN: [id number] PCP: Cari Caraway, MD      Visit Date: 03/20/2022   Universal Protocol:    Date/Time: 03/20/2022  Consent Given By: the patient  Position: PRONE  Additional Comments: Vital signs were monitored before and after the procedure. Patient was prepped and draped in the usual sterile fashion. The correct patient, procedure, and site was verified.   Injection Procedure Details:   Procedure diagnoses: Lumbar  radiculopathy [M54.16]    Meds Administered:  Meds ordered this encounter  Medications   methylPREDNISolone acetate (DEPO-MEDROL) injection 80 mg    Laterality: Bilateral  Location/Site: L4  Needle:5.0 in., 22 ga.  Short bevel or Quincke spinal needle  Needle Placement: Transforaminal  Findings:    -Comments: Excellent flow of contrast along the nerve, nerve root and into the epidural space.  Procedure Details: After squaring off the end-plates to get a true AP view, the C-arm was positioned so that an oblique view of the foramen as noted above was visualized. The target area is just inferior to the "nose of the scotty dog" or sub pedicular. The soft tissues overlying this structure were infiltrated with 2-3 ml. of 1% Lidocaine without Epinephrine.  The spinal needle was inserted toward the target using a "trajectory" view along the fluoroscope beam.  Under AP and lateral visualization, the needle was advanced so it did not puncture dura and was located close the 6 O'Clock position of the pedical in AP tracterory. Biplanar projections were used to confirm position. Aspiration was confirmed to be negative for CSF and/or blood. A 1-2 ml. volume of Isovue-250 was injected and flow of contrast was noted at each level. Radiographs were obtained for documentation purposes.   After attaining the desired flow of contrast documented above, a 0.5 to 1.0 ml test dose of 0.25% Marcaine was injected into each respective transforaminal space.  The patient was observed for 90 seconds post injection.  After no sensory deficits were reported, and normal lower extremity motor function was noted,   the  above injectate was administered so that equal amounts of the injectate were placed at each foramen (level) into the transforaminal epidural space.   Additional Comments:  The patient tolerated the procedure well Dressing: 2 x 2 sterile gauze and Band-Aid    Post-procedure details: Patient was observed  during the procedure. Post-procedure instructions were reviewed.  Patient left the clinic in stable condition.    Clinical History: CT LUMBAR MYELOGRAM FINDINGS:     T10-11: No stenosis. Facet degeneration and ligamentous  calcification. Solid bridging anterior osteophytes.     T11-12: No stenosis.     T12-L1: Mild bulging of the disc. No compressive stenosis. Conus tip  at this level.     L1-2: 2 mm retrolisthesis. Moderate multifactorial spinal stenosis.  Disc space narrowing. Endplate osteophytes. Facet and ligamentous  hypertrophy. Foraminal stenosis on both sides that could compress  the L1 nerves.     L2-3: 2 mm retrolisthesis. Chronic disc degeneration with loss of  disc height and vacuum phenomenon. Endplate osteophytes. Facet and  ligamentous hypertrophy. Severe multifactorial spinal stenosis  likely to cause neural compression. Bilateral foraminal stenosis  that could compress the L2 nerves.     L3-4: 2 mm retrolisthesis. Endplate osteophytes and bulging of the  disc. Facet and ligamentous hypertrophy. Severe multifactorial  spinal stenosis likely to cause neural compression. Bilateral  foraminal stenosis that could affect the exiting L3 nerves.     L4-5: Chronic facet arthropathy with anterolisthesis of 1 cm. Disc  degeneration with disc space narrowing and vacuum phenomenon.  Endplate osteophytes and bulging of the disc. Severe stenosis of the  central canal and both neural foramina likely to cause neural  compression.     L5-S1: Chronic disc degeneration with loss of disc height and vacuum  phenomenon. Endplate osteophytes. Facet hypertrophy and degeneration  with some ligamentous calcification. Severe stenosis of the  subarticular lateral recesses and neural foramina.     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 Curvature convex to the left in the thoracolumbar  junction region and towards the right in  the lower lumbar region. 2  mm retrolisthesis L1-2, L2-3 and L3-4. 1 cm anterolisthesis L4-5.     Severe multifactorial spinal stenosis and neural foraminal stenosis  likely to cause neural compression at L2-3, L3-4, L4-5 and L5-S1.  Moderate multifactorial canal stenosis at L1-2. Bilateral foraminal  stenosis at that level.        Electronically Signed    By: Nelson Chimes M.D.    On: 12/06/2019 10:12     Objective:  VS:  HT:    WT:   BMI:     BP:(!) 163/82  HR:76bpm  TEMP: ( )  RESP:  Physical Exam Vitals and nursing note reviewed.  Constitutional:      General: He is not in acute distress.    Appearance: Normal appearance. He is obese. He is not ill-appearing.  HENT:     Head: Normocephalic and atraumatic.     Right Ear: External ear normal.     Left Ear: External ear normal.     Nose: No congestion.  Eyes:     Extraocular Movements: Extraocular movements intact.  Cardiovascular:     Rate and Rhythm: Normal rate.     Pulses: Normal pulses.  Pulmonary:     Effort: Pulmonary effort is normal. No respiratory distress.  Abdominal:     General: There is no distension.     Palpations: Abdomen is soft.  Musculoskeletal:        General: No tenderness or signs of injury.     Cervical back: Neck supple.     Right lower leg: No edema.     Left lower leg: No edema.     Comments: Patient has good distal strength without clonus. Left foot weakness. No AFO.  Skin:    Findings: No erythema or rash.  Neurological:     General: No focal deficit present.     Mental Status: He is alert and oriented to person, place, and time.     Sensory: No sensory deficit.     Motor: No weakness or abnormal muscle tone.     Coordination: Coordination normal.  Psychiatric:        Mood and Affect: Mood normal.        Behavior: Behavior normal.     Imaging: No results found.

## 2022-04-09 ENCOUNTER — Ambulatory Visit (INDEPENDENT_AMBULATORY_CARE_PROVIDER_SITE_OTHER): Payer: Medicare Other | Admitting: Nurse Practitioner

## 2022-04-09 ENCOUNTER — Encounter: Payer: Self-pay | Admitting: Nurse Practitioner

## 2022-04-09 DIAGNOSIS — M25552 Pain in left hip: Secondary | ICD-10-CM

## 2022-04-09 DIAGNOSIS — I872 Venous insufficiency (chronic) (peripheral): Secondary | ICD-10-CM

## 2022-04-09 DIAGNOSIS — G4730 Sleep apnea, unspecified: Secondary | ICD-10-CM | POA: Diagnosis not present

## 2022-04-09 DIAGNOSIS — J302 Other seasonal allergic rhinitis: Secondary | ICD-10-CM

## 2022-04-09 DIAGNOSIS — J4530 Mild persistent asthma, uncomplicated: Secondary | ICD-10-CM | POA: Diagnosis not present

## 2022-04-09 MED ORDER — BREZTRI AEROSPHERE 160-9-4.8 MCG/ACT IN AERO: 2.0000 | INHALATION_SPRAY | Freq: Two times a day (BID) | RESPIRATORY_TRACT | 0 refills | Status: DC

## 2022-04-09 NOTE — Assessment & Plan Note (Signed)
Chronic with progressive worsening. No recent injury. Awaiting workup with ortho for possible surgical intervention. Feels like this negatively impacts his breathing with mobility.

## 2022-04-09 NOTE — Assessment & Plan Note (Addendum)
Overall improved since our last visit. Suspect DOE is multifactorial. Advised that I would like for him to still step up to [facility] - we will contact [employer] to see what else is needed for him to get coverage. Continue singulair for trigger prevention.  Patient Instructions  Restart Breztri 2 puffs Twice daily. Brush tongue and rinse mouth afterwards. Samples today. We are checking on your [employer] paperwork -Continue Albuterol inhaler 2 puffs or 3 mL neb every 6 hours as needed for shortness of breath or wheezing. Notify if symptoms persist despite rescue inhaler/neb use. -Continue nexium 40 mg daily  -Continue flonase nasal spray 2 sprays each nostril daily  -Continue lasix 40 mg daily as prescribed by cardiology -Continue Saline nasal spray 2-3 times a day for nasal congestion -Continue Singulair 10 mg At bedtime  -Continue Astelin nasal spray 2 sprays each nostril Twice daily as needed  Continue CPAP nightly, minimum 4-6 hours a night  Take all of your inhalers and your CPAP machine with you to surgery. Out of bed as soon as possible after surgery. Use incentive spirometer (you should be provided with one at the hospital) 10 times an hour while awake following your procedure.    Follow up in 3 months with Dr. Vaughan Browner. If symptoms do not improve or worsen, please contact office for sooner follow up or seek emergency care.

## 2022-04-09 NOTE — Patient Instructions (Addendum)
Restart Breztri 2 puffs Twice daily. Brush tongue and rinse mouth afterwards. Samples today. We are checking on your [employer] paperwork -Continue Albuterol inhaler 2 puffs or 3 mL neb every 6 hours as needed for shortness of breath or wheezing. Notify if symptoms persist despite rescue inhaler/neb use. -Continue nexium 40 mg daily  -Continue flonase nasal spray 2 sprays each nostril daily  -Continue lasix 40 mg daily as prescribed by cardiology -Continue Saline nasal spray 2-3 times a day for nasal congestion -Continue Singulair 10 mg At bedtime  -Continue Astelin nasal spray 2 sprays each nostril Twice daily as needed  Continue CPAP nightly, minimum 4-6 hours a night  Take all of your inhalers and your CPAP machine with you to surgery. Out of bed as soon as possible after surgery. Use incentive spirometer (you should be provided with one at the hospital) 10 times an hour while awake following your procedure.    Follow up in 3 months with Dr. Vaughan Browner. If symptoms do not improve or worsen, please contact office for sooner follow up or seek emergency care.

## 2022-04-09 NOTE — Assessment & Plan Note (Signed)
Stable symptoms with chronic BLE edema. Currently on lasix daily. Advise he follow up with cardiology as he hasn't been seen in a while.

## 2022-04-09 NOTE — Assessment & Plan Note (Signed)
Compliant with therapy and continues to receive good benefit. 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

## 2022-04-09 NOTE — Progress Notes (Addendum)
$'@Patient'J$  ID: [name], male    DOB: Dec 11, 1940, 81 y.o.   MRN: [id number]  Chief Complaint  Patient presents with   Follow-up    Patient still wheezing and having trouble with breath and strength.    Referring provider: Cari Caraway, MD  HPI: 81 year old male, never smoker followed for mild intermittent asthma and DOE.  He is a patient of Dr. Matilde Bash and last seen in office on 02/03/2022 by [facility] NP.  Past medical history significant for venous insufficiency, hypertension, allergic rhinitis, tubular adenoma of colon, chronic GERD, hypogonadism, chronic fatigue, DJD, BPH, CKD stage III, HLD, GAD, MDD, primary insomnia, history of sleep apnea.  TEST/EVENTS:  11/25/2019 PFTs: FVC 3.21 (82), FEV1 2.61 (93), ratio 81, TLC 87, DLCOunc 90%. No significant BD 12/02/2021 CTA chest: Lungs clear.  No suspicious pulmonary nodules.  No evidence of PE.  Atherosclerosis was present. 03/06/2022 CTA chest: mild central airways thickening. No evidence of superimposed infection, ptx or effusion  06/04/2021: OV with Dr. Vaughan Browner.  Originally referred for evaluation of dyspnea with ongoing symptoms x2 years.  Associated with wheezing chest tightness.  He was on albuterol inhaler and previously tried Advair but felt it did not make a difference.  He was changed to Symbicort and at one point was on [place] but felt like Symbicort worked better for him.  Continued to have dyspnea upon exertion.  Cardiac work-up with normal EF and BMP.  Chest x-ray showed mild interstitial changes.  Plan for HRCT for better evaluation.  Does have mold exposure at home but no evidence of hypersensitivity pneumonitis on most recent imaging or labs.  Continued Symbicort twice daily.  Advised to initiate healthy weight management.  Continued on Nexium for GERD.  Compliant on CPAP  01/24/2022: OV with Ndia Sampath NP. Asthma exacerbation - tx with prednisone burst and doxy course. CXR clear. Reports of minimal amount of blood tinged sputum -  pink. Advised to monitor and seek emergency care if this worsens. Trigger prevention with singulair. Postnasal drainage and cough control therapies. COVID and flu were negative.   02/03/2022: OV with Shanyla Marconi NP for follow up visit. He reports feeling better but his cough has slowly returned. It had mostly resolved after his prednisone and doxy course but has since returned. His cough is dry. He has not had anymore episodes of hemoptysis. He does have some occasional wheezing since coming off the prednisone as well. Extended pred taper for asthma exacerbation. Advised step up to [facility]. Continued on singulair. His nasal congestion has improved with new regimen of astelin and flonase.   04/09/2022: Today - follow up Patient presents today for intended follow-up. He reports that his breathing is relatively the same as when I saw him last. He does have issues with mobility due to significant pain in his left hip, which causes him to become more winded with activity. He is awaiting workup with ortho for possible hip surgery. He has some occasional wheezing, usually at night. His cough has resolved. He has ongoing swelling in his legs, but this is unchanged from his baseline. Currently has unaboot wrap on right leg. He denies hemoptysis, fevers, orthopnea, PND, palpitations. He is currently on Symbicort; never received notification from [employer] if he was approved for [facility] coverage. He takes singulair at bedtime and flonase - feels like his allergy symptoms are well-controlled. He does have CPAP prescribed through a different provider and reports wearing it nightly. He has not had follow up with them for a while now.  No Known Allergies  Immunization History  Administered Date(s) Administered   Hepatitis A, Adult 07/31/2016   Influenza Split 09/28/2012, 09/10/2020, 08/12/2021   Influenza,inj,Quad PF,6+ Mos 08/03/2013, 07/21/2014, 09/13/2015, 07/23/2016, 07/14/2019   Moderna Covid-19 Vaccine Bivalent Booster  92yr & up 09/03/2021   Moderna Sars-Covid-2 Vaccination 11/21/2019, 12/19/2019, 08/24/2020, 05/04/2021   Pneumococcal Conjugate-13 03/25/2015   Pneumococcal Polysaccharide-23 04/29/2011, 06/28/2016   Tdap 04/25/2013    Past Medical History:  Diagnosis Date   Allergy    takes Zyrtec daily   Anxiety    takes Ativan daily as needed   Arthritis    Asthma    Back pain    BPH (benign prostatic hyperplasia)    takes doxazosin for   Carpal tunnel syndrome    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   L4, L5 ,S1   Depression    takes Wellbutrin daily   Dyspnea on exertion    with exertion   GERD (gastroesophageal reflux disease)    takes Nexium and Omeprazole daily   History of kidney stones    HTN (hypertension)    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   Hyperlipidemia    Joint pain    [place]romuscular disorder [place]    Prediabetes    Sleep apnea    uses CPAP nightly   SOB (shortness of breath)    Swallowing difficulty    Swelling of lower extremity    right more than leg leg   Umbilical hernia     Tobacco History: Social History   Tobacco Use  Smoking Status Never  Smokeless Tobacco Never   Counseling given: Not Answered   Outpatient Medications Prior to Visit  Medication Sig Dispense Refill   albuterol (VENTOLIN HFA) 108 (90 Base) MCG/ACT inhaler Inhale 2 puffs into the lungs every 6 (six) hours as needed for wheezing or shortness of breath. 8 g 2   B-D 3CC LUER-LOK SYR 21GX1-1/2 21G X 1-1/2" 3 ML MISC USE TO INJECT DEPOTESTOSTERONE. 20 each 0   benzonatate (TESSALON) 200 MG capsule Take 1 capsule (200 mg total) by mouth 3 (three) times daily as needed for cough. 30 capsule 1   Budeson-Glycopyrrol-Formoterol (BREZTRI AEROSPHERE) 160-9-4.8 MCG/ACT AERO Inhale 2 puffs into the lungs in the morning and at bedtime. 5.9 g 0   budesonide-formoterol (SYMBICORT) 160-4.5 MCG/ACT inhaler Inhale 2 puffs into the lungs 2 (two) times daily. 1 each 11   buPROPion (WELLBUTRIN  XL) 300 MG 24 hr tablet TAKE 1 TABLET BY MOUTH DAILY *NEEDS OFFICE VISIT (Patient taking differently: Take 300 mg by mouth daily.) 30 tablet 11   calcium carbonate 200 MG capsule Take 200 mg by mouth daily.      doxazosin (CARDURA) 8 MG tablet TAKE 1 TABLET BY MOUTH DAILY  . Take at night (Patient taking differently: Take 8 mg by mouth every evening.) 30 tablet 11   esomeprazole (NEXIUM) 40 MG capsule Take 1 capsule (40 mg total) by mouth 2 (two) times daily before a meal. (Patient taking differently: Take 40 mg by mouth daily before breakfast.) 60 capsule 0   etodolac (LODINE) 400 MG tablet Take 1 tablet (400 mg total) by mouth 2 (two) times daily as needed. (Patient taking differently: Take 400 mg by mouth See admin instructions. Take 400 mg by mouth in the evening when not taking Meloxicam) 180 tablet 1   flurazepam (DALMANE) 15 MG capsule Take by mouth.     fluticasone (FLONASE) 50 MCG/ACT nasal spray Place 1 spray into both nostrils  daily. (Patient taking differently: Place 1 spray into both nostrils in the morning and at bedtime.) 16 g 2   furosemide (LASIX) 20 MG tablet TAKE 2 TABLETS BY MOUTH ONCE A DAY 60 tablet 0   furosemide (LASIX) 40 MG tablet Take 40 mg by mouth daily.     gabapentin (NEURONTIN) 300 MG capsule TAKE 1 TO 2 CAPSULES BY MOUTH AT BEDTIME (Patient taking differently: Take 600 mg by mouth at bedtime.) 60 capsule 0   glucosamine-chondroitin 500-400 MG tablet Take 1 tablet by mouth 2 (two) times daily.     guaiFENesin (MUCINEX) 600 MG 12 hr tablet Take 1 tablet (600 mg total) by mouth 2 (two) times daily as needed. (Patient taking differently: Take 600 mg by mouth 2 (two) times daily as needed for cough or to loosen phlegm.) 30 tablet 1   HYDROcodone-acetaminophen (NORCO) 5-325 MG tablet 1 po q d prn pain 45 tablet 0   LORazepam (ATIVAN) 1 MG tablet Take 0.5-1 mg by mouth at bedtime.     meloxicam (MOBIC) 15 MG tablet Take 15 mg by mouth every evening.     methocarbamol  (ROBAXIN) 500 MG tablet Take 1 tablet (500 mg total) by mouth every 8 (eight) hours as needed for muscle spasms. (Patient taking differently: Take 500 mg by mouth in the morning and at bedtime.) 20 tablet 0   mirtazapine (REMERON) 15 MG tablet Take 15 mg by mouth at bedtime.     montelukast (SINGULAIR) 10 MG tablet Take 1 tablet (10 mg total) by mouth at bedtime. 30 tablet 5   Multiple Vitamins-Minerals (CENTRUM SILVER) tablet Take 1 tablet by mouth daily.     NEEDLE, DISP, 21 G 21G X 1-1/2" MISC Use to inject depotestosterone. 50 each 0   potassium chloride SA (K-DUR,KLOR-CON) 20 MEQ tablet Take 1 tablet (20 mEq total) by mouth 2 (two) times daily. 18 tablet 3   predniSONE (DELTASONE) 10 MG tablet 4 tabs for 3 days, then 3 tabs for 3 days, 2 tabs for 3 days, then 1 tab for 3 days, then stop 30 tablet 0   rosuvastatin (CRESTOR) 5 MG tablet Take 5 mg by mouth daily.     sildenafil (REVATIO) 20 MG tablet Take by mouth.     Syringe, Reusable, 3 ML MISC Use to inject depotestosterone. 1 each 0   testosterone cypionate (DEPOTESTOSTERONE CYPIONATE) 200 MG/ML injection every Saturday.     azelastine (ASTELIN) 0.1 % nasal spray Place 2 sprays into both nostrils 2 (two) times daily. Use in each nostril as directed until symptoms improve (Patient not taking: Reported on 02/03/2022) 30 mL 3   No facility-administered medications prior to visit.     Review of Systems:   Constitutional: No weight loss or gain, night sweats, fevers, chills. +fatigue (chronic) HEENT: No headaches, difficulty swallowing, tooth/dental problems, or sore throat. No sneezing, itching, ear ache, nasal congestion, post nasal drainage  CV:  No chest pain, orthopnea, PND, swelling in lower extremities, anasarca, dizziness, palpitations, syncope Resp: +shortness of breath with exertion (baseline); minimal nocturnal wheezing. No excess mucus or change in color of mucus. No cough. No hemoptysis. No chest wall deformity MSK: +left hip  pain. No joint swelling or redness Skin: No rash, lesions, ulcerations Neuro: No dizziness or lightheadedness.  Psych: No depression or anxiety. Mood stable.     Physical Exam:  BP (!) 148/82 (BP Location: Right Arm, Patient Position: Sitting, Cuff Size: Large)   Pulse 67   Temp 98.3 F (36.8  C) (Oral)   Ht '5\' 7"'$  (1.702 m)   Wt 258 lb (117 kg)   SpO2 98%   BMI 40.41 kg/m   GEN: Pleasant, interactive; elderly; obese; in no acute distress. HEENT:  Normocephalic and atraumatic. EACs patent bilaterally. TM pearly gray with present light reflex bilaterally. PERRLA. Sclera white. Nasal turbinates pink, moist and patent bilaterally. Clear rhinorrhea present. Oropharynx pink and moist, without exudate or edema. No lesions, ulcerations, or postnasal drip.  NECK:  Supple w/ fair ROM. No JVD present. Normal carotid impulses w/o bruits. Thyroid symmetrical with no goiter or nodules palpated. No lymphadenopathy.   CV: RRR, no m/r/g, +2 pitting RLE edema, +1 LLE edema. Pulses intact, +2 bilaterally. No cyanosis, pallor or clubbing. PULMONARY:  Unlabored, regular breathing. Clear bilaterally A&P without wheezes/rales/rhonchi. No accessory muscle use. No dullness to percussion. GI: BS present and normoactive. Soft, non-tender to palpation. No organomegaly or masses detected. No CVA tenderness. MSK: No erythema, warmth or tenderness. Cap refil <2 sec all extrem. No deformities or joint swelling noted.  Neuro: A/Ox3. No focal deficits noted.   Skin: Warm, no lesions or rashe Psych: Normal affect and behavior. Judgement and thought content appropriate.     Lab Results:  CBC    Component Value Date/Time   WBC 7.0 12/02/2021 1300   RBC 5.01 12/02/2021 1300   HGB 15.6 12/02/2021 1300   HCT 46.8 12/02/2021 1300   PLT 177 12/02/2021 1300   MCV 93.4 12/02/2021 1300   MCV 90.9 05/19/2016 1235   MCH 31.1 12/02/2021 1300   MCHC 33.3 12/02/2021 1300   RDW 13.7 12/02/2021 1300   LYMPHSABS 0.9  12/02/2021 1300   MONOABS 0.6 12/02/2021 1300   EOSABS 0.2 12/02/2021 1300   BASOSABS 0.0 12/02/2021 1300    BMET    Component Value Date/Time   NA 139 12/02/2021 1300   K 3.7 12/02/2021 1300   CL 101 12/02/2021 1300   CO2 29 12/02/2021 1300   GLUCOSE 94 12/02/2021 1300   BUN 18 12/02/2021 1300   CREATININE 1.61 (H) 12/02/2021 1300   CREATININE 1.72 (H) 04/05/2021 1527   CALCIUM 8.6 (L) 12/02/2021 1300   GFRNONAA 43 (L) 12/02/2021 1300   GFRNONAA 45 (L) 05/19/2016 1215   GFRAA 48 (L) 06/24/2016 1500   GFRAA 51 (L) 05/19/2016 1215    BNP    Component Value Date/Time   BNP 15.1 08/07/2021 1721   BNP 24 04/05/2021 1527     Imaging:  Epidural Steroid injection  Result Date: 03/20/2022 Magnus Sinning, MD     04/06/2022  2:57 PM Lumbosacral Transforaminal Epidural Steroid Injection - Sub-Pedicular Approach with Fluoroscopic Guidance Patient: [name]     Date of Birth: [birth year] MRN: [id number] PCP: Cari Caraway, MD     Visit Date: 03/20/2022  Universal Protocol:   Date/Time: 03/20/2022 Consent Given By: the patient Position: PRONE Additional Comments: Vital signs were monitored before and after the procedure. Patient was prepped and draped in the usual sterile fashion. The correct patient, procedure, and site was verified. Injection Procedure Details: Procedure diagnoses: Lumbar radiculopathy [M54.16]  Meds Administered: Meds ordered this encounter Medications  methylPREDNISolone acetate (DEPO-MEDROL) injection 80 mg Laterality: Bilateral Location/Site: L4 Needle:5.0 in., 22 ga.  Short bevel or Quincke spinal needle Needle Placement: Transforaminal Findings:   -Comments: Excellent flow of contrast along the nerve, nerve root and into the epidural space. Procedure Details: After squaring off the end-plates to get a true AP view, the C-arm was positioned so  that an oblique view of the foramen as noted above was visualized. The target area is just inferior to the "nose of the  scotty dog" or sub pedicular. The soft tissues overlying this structure were infiltrated with 2-3 ml. of 1% Lidocaine without Epinephrine. The spinal needle was inserted toward the target using a "trajectory" view along the fluoroscope beam.  Under AP and lateral visualization, the needle was advanced so it did not puncture dura and was located close the 6 O'Clock position of the pedical in AP tracterory. Biplanar projections were used to confirm position. Aspiration was confirmed to be negative for CSF and/or blood. A 1-2 ml. volume of Isovue-250 was injected and flow of contrast was noted at each level. Radiographs were obtained for documentation purposes. After attaining the desired flow of contrast documented above, a 0.5 to 1.0 ml test dose of 0.25% Marcaine was injected into each respective transforaminal space.  The patient was observed for 90 seconds post injection.  After no sensory deficits were reported, and normal lower extremity motor function was noted,   the above injectate was administered so that equal amounts of the injectate were placed at each foramen (level) into the transforaminal epidural space. Additional Comments: The patient tolerated the procedure well Dressing: 2 x 2 sterile gauze and Band-Aid  Post-procedure details: Patient was observed during the procedure. Post-procedure instructions were reviewed. Patient left the clinic in stable condition.   DG Foot Complete Right  Result Date: 03/23/2022 CLINICAL DATA:  Nonhealing wound, right great toe x 1 year. EXAM: RIGHT FOOT COMPLETE - 3+ VIEW COMPARISON:  X-ray 11/05/2021; MRI 05/04/2022r. FINDINGS: There is deformity in the tip of distal phalanx of big toe with no significant interval change. No focal lytic lesions are seen. 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 Degenerative changes are noted in the interphalangeal joint  of big toe. Degenerative changes are also noted in the first and second metatarsophalangeal joints. There is 8 mm calcific density at the tip of right big toe. This may suggest an artifact on the skin or soft tissue calcification from chronic inflammation. There are no definite pockets of air in the soft tissues. There is marked soft tissue swelling over the dorsum. IMPRESSION: No definite recent fracture or dislocation is seen. No focal lytic lesions are seen. If there is clinical suspicion for osteomyelitis follow-up MRI or three-phase bone scan may be considered. Other findings as described in the body of the report. Electronically Signed   By: Elmer Picker M.D.   On: 03/23/2022 15:10   XR C-ARM NO REPORT  Result Date: 03/20/2022 Please see Notes tab for imaging impression.   methylPREDNISolone acetate (DEPO-MEDROL) injection 80 mg     Date Action Dose Route User   03/20/2022 1315 Given 80 mg Other Magnus Sinning, MD          Latest Ref Rng & Units 11/25/2019   11:06 AM  PFT Results  FVC-Pre L 3.17    FVC-Predicted Pre % 81    FVC-Post L 3.21    FVC-Predicted Post % 82    Pre FEV1/FVC % % 78    Post FEV1/FCV % % 81    FEV1-Pre L 2.48    FEV1-Predicted Pre % 88    FEV1-Post L 2.61    DLCO uncorrected ml/min/mmHg 21.42    DLCO [facility]% % 90    DLVA Predicted %  112    TLC L 5.93    TLC % Predicted % 87    RV % Predicted % 108      Lab Results  Component Value Date   NITRICOXIDE 25 01/24/2022        Assessment & Plan:   Mild persistent asthma Overall improved since our last visit. Suspect DOE is multifactorial. Advised that I would like for him to still step up to [facility] - we will contact [employer] to see what else is needed for him to get coverage. Continue singulair for trigger prevention.  Patient Instructions  Restart Breztri 2 puffs Twice daily. Brush tongue and rinse mouth afterwards. Samples today. We are checking on your [employer] paperwork -Continue Albuterol  inhaler 2 puffs or 3 mL neb every 6 hours as needed for shortness of breath or wheezing. Notify if symptoms persist despite rescue inhaler/neb use. -Continue nexium 40 mg daily  -Continue flonase nasal spray 2 sprays each nostril daily  -Continue lasix 40 mg daily as prescribed by cardiology -Continue Saline nasal spray 2-3 times a day for nasal congestion -Continue Singulair 10 mg At bedtime  -Continue Astelin nasal spray 2 sprays each nostril Twice daily as needed  Continue CPAP nightly, minimum 4-6 hours a night  Take all of your inhalers and your CPAP machine with you to surgery. Out of bed as soon as possible after surgery. Use incentive spirometer (you should be provided with one at the hospital) 10 times an hour while awake following your procedure.    Follow up in 3 months with Dr. Vaughan Browner. If symptoms do not improve or worsen, please contact office for sooner follow up or seek emergency care.    Allergic rhinitis Well-controlled on current regimen. Continue singulair and flonase.  Left hip pain Chronic with progressive worsening. No recent injury. Awaiting workup with ortho for possible surgical intervention. Feels like this negatively impacts his breathing with mobility.   Venous (peripheral) insufficiency Stable symptoms with chronic BLE edema. Currently on lasix daily. Advise he follow up with cardiology as he hasn't been seen in a while.   Sleep apnea Compliant with therapy and continues to receive good benefit. 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    I spent 32 minutes of dedicated to the care of this patient on the date of this encounter to include pre-visit review of records, face-to-face time with the patient discussing conditions above, post visit ordering of testing, clinical documentation with the electronic health record, making appropriate referrals as documented, and  communicating necessary findings to members of the patients care team.  Clayton Bibles, NP 04/09/2022  Pt aware and understands NP's role.

## 2022-04-09 NOTE — Assessment & Plan Note (Signed)
Well-controlled on current regimen. Continue singulair and flonase.

## 2022-04-10 ENCOUNTER — Other Ambulatory Visit: Payer: Self-pay | Admitting: Orthopedic Surgery

## 2022-04-10 ENCOUNTER — Encounter (HOSPITAL_BASED_OUTPATIENT_CLINIC_OR_DEPARTMENT_OTHER): Payer: Medicare Other | Attending: General Surgery | Admitting: General Surgery

## 2022-04-10 DIAGNOSIS — I129 Hypertensive chronic kidney disease with stage 1 through stage 4 chronic kidney disease, or unspecified chronic kidney disease: Secondary | ICD-10-CM | POA: Diagnosis not present

## 2022-04-10 DIAGNOSIS — R7303 Prediabetes: Secondary | ICD-10-CM | POA: Insufficient documentation

## 2022-04-10 DIAGNOSIS — L97512 Non-pressure chronic ulcer of other part of right foot with fat layer exposed: Secondary | ICD-10-CM | POA: Insufficient documentation

## 2022-04-10 DIAGNOSIS — G6289 Other specified polyneuropathies: Secondary | ICD-10-CM | POA: Diagnosis not present

## 2022-04-10 DIAGNOSIS — N183 Chronic kidney disease, stage 3 unspecified: Secondary | ICD-10-CM | POA: Insufficient documentation

## 2022-04-10 DIAGNOSIS — I872 Venous insufficiency (chronic) (peripheral): Secondary | ICD-10-CM | POA: Insufficient documentation

## 2022-04-10 DIAGNOSIS — L97812 Non-pressure chronic ulcer of other part of right lower leg with fat layer exposed: Secondary | ICD-10-CM | POA: Diagnosis not present

## 2022-04-10 DIAGNOSIS — R600 Localized edema: Secondary | ICD-10-CM | POA: Insufficient documentation

## 2022-04-10 NOTE — Progress Notes (Signed)
SENAI, KINGSLEY ([id number]) Visit Report for 04/10/2022 Arrival Information Details Patient Name: [name]: [name], [name] [name] 04/10/2022 10:00 A M Medical Record Number: [id number] Patient Account Number: [id number] Date of Birth/Sex: Treating RN: 23-Nov-1940 (81 y.o. [name] Primary Care Eban Weick: [name] Other Clinician: Referring Sholom Dulude: Treating Cniyah Sproull/Extender: Benedict Needy Weeks in Treatment: 3 Visit Information History Since Last Visit Added or deleted any medications: No Patient Arrived: Ambulatory Any new allergies or adverse reactions: No Arrival Time: 10:26 Had a fall or experienced change in No Accompanied By: self activities of daily living that may affect Transfer Assistance: Manual risk of falls: Patient Identification Verified: Yes Signs or symptoms of abuse/neglect since last visito No Secondary Verification Process Completed: Yes Hospitalized since last visit: No Patient Requires Transmission-Based Precautions: No Implantable device outside of the clinic excluding No Patient Has Alerts: Yes cellular tissue based products placed in the center Patient Alerts: R ABI: 1.14 TBI: 0.90 since last visit: L ABI: 1.16 TBI: 0.80 Has Dressing in Place as Prescribed: Yes Pain Present Now: Yes Electronic Signature(s) Signed: 04/10/2022 5:16:25 PM By: Adline Peals Entered By: Adline Peals on 04/10/2022 10:30:39 -------------------------------------------------------------------------------- Compression Therapy Details [name] 04/10/2022 10:00 A M Medical Record Number: [id number] Patient Account Number: [id number] Date of Birth/Sex: Treating RN: 02/26/1941 (81 y.o. [name] Primary Care Elester Apodaca: [name] Other Clinician: Referring Michaelangelo Mittelman: Treating Kelicia Youtz/Extender: Benedict Needy Weeks in Treatment: 3 Compression Therapy  Performed for Wound Assessment: Wound #2 Right,Anterior Lower Leg Performed By: Clinician Levan Hurst, RN Compression Type: Four Layer Post Procedure Diagnosis Same as Pre-procedure Electronic Signature(s) Signed: 04/10/2022 6:12:29 PM By: Levan Hurst RN, BSN Entered By: Levan Hurst on 04/10/2022 10:59:13 -------------------------------------------------------------------------------- Compression Therapy Details [name] 04/10/2022 10:00 A M Medical Record Number: [id number] Patient Account Number: [id number] Date of Birth/Sex: Treating RN: July 15, 1941 (81 y.o. [name] Primary Care Tashunda Vandezande: [name] Other Clinician: Referring Senie Lanese: Treating Marsha Gundlach/Extender: Benedict Needy Weeks in Treatment: 3 Compression Therapy Performed for Wound Assessment: Wound #5 Right,Distal,Anterior Lower Leg Performed By: Clinician Levan Hurst, RN Compression Type: Four Layer Post Procedure Diagnosis Same as Pre-procedure Electronic Signature(s) Signed: 04/10/2022 6:12:29 PM By: Levan Hurst RN, BSN Entered By: Levan Hurst on 04/10/2022 10:59:13 -------------------------------------------------------------------------------- Compression Therapy Details [name] 04/10/2022 10:00 A M Medical Record Number: [id number] Patient Account Number: [id number] Date of Birth/Sex: Treating RN: 18-Feb-1941 (81 y.o. [name] Primary Care Peyson Postema: [name] Other Clinician: Referring Durward Matranga: Treating Kineta Fudala/Extender: Benedict Needy Weeks in Treatment: 3 Compression Therapy Performed for Wound Assessment: Wound #6 Right,Lateral,Anterior Lower Leg Performed By: Clinician Levan Hurst, RN Compression Type: Four Layer Post Procedure Diagnosis Same as Pre-procedure Electronic Signature(s) Signed: 04/10/2022 6:12:29 PM By: Levan Hurst RN,  BSN Entered By: Levan Hurst on 04/10/2022 10:59:13 -------------------------------------------------------------------------------- Encounter Discharge Information Details [name] 04/10/2022 10:00 A M Medical Record Number: [id number] Patient Account Number: [id number] Date of Birth/Sex: Treating RN: [birth year] (81 y.o. [name] Primary Care Timmey Lamba: [name] Other Clinician: Referring Tihanna Goodson: Treating Gracieann Stannard/Extender: Benedict Needy Weeks in Treatment: 3 Encounter Discharge Information Items Post Procedure Vitals Discharge Condition: Stable Temperature (F): 98.5 Ambulatory Status: Cane Pulse (bpm): 68 Discharge Destination: Home Respiratory Rate (breaths/min): 18 Transportation: Private Auto Blood Pressure (  mmHg): 160/66 Accompanied By: alone Schedule Follow-up Appointment: Yes Clinical Summary of Care: Patient Declined Electronic Signature(s) Signed: 04/10/2022 6:12:29 PM By: Levan Hurst RN, BSN Entered By: Levan Hurst on 04/10/2022 18:06:01 -------------------------------------------------------------------------------- Lower Extremity Assessment Details [name] 04/10/2022 10:00 A M Medical Record Number: [id number] Patient Account Number: [id number] Date of Birth/Sex: Treating RN: 03/10/1941 (81 y.o. [name] Primary Care Martika Egler: [name] Other Clinician: Referring Freddrick Gladson: Treating Jojo Pehl/Extender: Benedict Needy Weeks in Treatment: 3 Edema Assessment Assessed: [Left: No] [Right: No] Edema: [Left: Ye] [Right: s] Calf Left: Right: Point of Measurement: 29 cm From Medial Instep 35.7 cm Ankle Left: Right: Point of Measurement: 12 cm From Medial Instep 29 cm Vascular Assessment Pulses: Dorsalis Pedis Palpable: [Right:Yes] Electronic Signature(s) Signed: 04/10/2022 5:16:25 PM By: Adline Peals Entered By: Adline Peals on 04/10/2022 10:35:03 -------------------------------------------------------------------------------- Multi Wound Chart Details [name] 04/10/2022 10:00 A M Medical Record Number: [id number] Patient Account Number: [id number] Date of Birth/Sex: Treating RN: [birth year] (81 y.o. 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] Primary Etiology: [2:Cataracts, Chronic sinus] [3:Cataracts, Chronic sinus] [4:Cataracts, Chronic sinus] Comorbid History: [2:problems/congestion, Asthma, Sleep problems/congestion, Asthma, Sleep problems/congestion, Asthma, Sleep Apnea, Hypertension, Peripheral Venous Disease, Osteoarthritis 03/03/2022] [3:Apnea, Hypertension, Peripheral Venous Disease,  Osteoarthritis 03/10/2022] [4:Apnea, Hypertension, Peripheral Venous Disease, Osteoarthritis 11/10/2021] Date Acquired: [2:3] [3:3] [4:3] Weeks of Treatment: [2:Open] [3:Open] [4:Open] Wound Status: [2:No] [3:No] [4:No] Wound Recurrence: [2:No] [3:No] [4:No] Clustered Wound: [2:N/A] [3:N/A] [4:N/A] Clustered Quantity: [2:1.3x2.5x0.1] [3:0.5x0.3x0.1] [4:0.1x0.1x0.1] Measurements L x W x D (cm) [2:2.553] [3:0.118] [4:0.008] A (cm) : rea [2:0.255] [3:0.012] [4:0.001] Volume (cm) : [2:37.50%] [3:93.20%] [4:88.70%] % Reduction in A [2:rea: 37.50%] [3:93.10%] [4:92.90%] %  Reduction in Volume: [2:Full Thickness Without Exposed] [3:Full Thickness Without Exposed] [4:Full Thickness Without Exposed] Classification: [2:Support Structures Medium] [3:Support Structures Medium] [4:Support Structures None Present] Exudate A mount: [2:Serosanguineous] [3:Serosanguineous] [4:N/A] Exudate Type: [2:red, brown] [3:red, brown] [4:N/A] Exudate Color: [2:Flat and Intact] [3:Flat and Intact] [4:Well defined, not attached] Wound Margin: [2:Large (67-100%)] [3:Large (67-100%)] [4:None Present (0%)] Granulation A mount: [2:Red, Pink] [3:Red] [4:N/A] Granulation Quality: [2:Small (1-33%)] [3:None Present (0%)] [4:None Present (0%)] Necrotic A mount: [2:Fat Layer (Subcutaneous Tissue): Yes Fat Layer (Subcutaneous Tissue): Yes Fascia: No] Exposed Structures: [2:Fascia: No Tendon: No Muscle: No Joint: No Bone: No Small (1-33%)] [3:Fascia: No Tendon: No Muscle: No Joint: No Bone: No Medium (34-66%)] [4:Fat Layer (Subcutaneous Tissue): No Tendon: No Muscle: No Joint: No Bone: No Large (67-100%)] Epithelialization: [2:Debridement - Selective/Open Wound Debridement - Selective/Open Wound Debridement - Selective/Open Wound] Debridement: Pre-procedure Verification/Time Out 10:52 [3:10:52] [4:10:52] Taken: [2:Slough] [3:Necrotic/Eschar] [4:Callus] Tissue Debrided: [2:Non-Viable Tissue] [3:Non-Viable Tissue] [4:Non-Viable Tissue] Level: [2:3.25] [3:0.15] [4:0.04] Debridement A (sq cm): [2:rea Curette] [3:Curette] [4:Curette] Instrument: [2:Minimum] [3:Minimum] [4:Minimum] Bleeding: [2:Pressure] [3:Pressure] [4:Pressure] Hemostasis A chieved: [2:0] [3:0] [4:0] Procedural Pain: [2:0] [3:0] [4:0] Post Procedural Pain: [2:Procedure was tolerated well] [3:Procedure was tolerated well] [4:Procedure was tolerated well] Debridement Treatment Response: [2:1.3x2.5x0.1] [3:0.5x0.3x0.1] [4:0.1x0.1x0.1] Post Debridement Measurements L x W x D (cm) [2:0.255] [3:0.012] [4:0.001]  Post Debridement Volume:  (cm) [2:Compression Therapy] [3:Debridement] [4:Debridement] Procedures Performed: [2:Debridement 5] [3:6] [4:N/A] Right, Distal, Anterior Lower Leg Right, Lateral, Anterior Lower Leg N/A Wound Location: Blister Blister N/A 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 N/A Primary Etiology: Cataracts, Chronic sinus Cataracts, Chronic sinus N/A Comorbid History: problems/congestion, Asthma, Sleep problems/congestion, Asthma, Sleep Apnea, Hypertension, Peripheral Apnea, Hypertension, Peripheral Venous Disease, Osteoarthritis Venous Disease, Osteoarthritis 04/02/2022 04/02/2022 N/A Date Acquired: 1 1 N/A Weeks of Treatment: Open Open N/A Wound Status: No No N/A Wound Recurrence: Yes No N/A Clustered Wound: 2 N/A N/A Clustered Quantity: 3x1.1x0.1 1x1x0.1 N/A Measurements L x W x D (cm) 2.592 0.785 N/A A (cm) : rea 0.259 0.079 N/A Volume (cm) : -175.20% -185.50% N/A % Reduction in Area: -175.50% -192.60% N/A % Reduction in Volume: Full Thickness Without Exposed Full Thickness Without Exposed N/A Classification: Support Structures Support Structures Medium Medium N/A Exudate A mount: Serous Serosanguineous N/A Exudate Type: amber red, brown N/A Exudate Color: Flat and Intact Flat and Intact N/A Wound Margin: Large (67-100%) Large (67-100%) N/A Granulation A mount: Pink, Pale Red, Pink N/A Granulation Quality: None Present (0%) None Present (0%) N/A Necrotic A mount: Fat Layer (Subcutaneous Tissue): Yes Fat Layer (Subcutaneous Tissue): Yes N/A Exposed Structures: Fascia: No Fascia: No Tendon: No Tendon: No Muscle: No Muscle: No Joint: No Joint: No Bone: No Bone: No Small (1-33%) Small (1-33%) N/A Epithelialization: N/A Debridement - Selective/Open Wound N/A Debridement: Pre-procedure Verification/Time Out N/A 10:52 N/A Taken: N/A Slough N/A Tissue Debrided: N/A Non-Viable Tissue N/A Level: N/A 1 N/A Debridement A (sq cm): rea N/A Curette  N/A Instrument: N/A Minimum N/A Bleeding: N/A Pressure N/A Hemostasis A chieved: N/A 0 N/A Procedural Pain: N/A 0 N/A Post Procedural Pain: N/A Procedure was tolerated well N/A Debridement Treatment Response: N/A 1x1x0.1 N/A Post Debridement Measurements L x W x D (cm) N/A 0.079 N/A Post Debridement Volume: (cm) Compression Therapy Compression Therapy N/A Procedures Performed: Debridement Treatment Notes Electronic Signature(s) Signed: 04/10/2022 11:02:21 AM By: Fredirick Maudlin MD FACS Signed: 04/10/2022 6:12:29 PM By: Levan Hurst RN, BSN Entered By: Fredirick Maudlin on 04/10/2022 11:02:21 -------------------------------------------------------------------------------- Multi-Disciplinary Care Plan Details [name] 04/10/2022 10:00 A M Medical Record Number: [id number] Patient Account Number: [id number] Date of Birth/Sex: Treating RN: 12/12/1940 (81 y.o. [name] Primary Care Jazmeen Axtell: [name] Other Clinician: Referring Skie Vitrano: Treating Garry Bochicchio/Extender: Benedict Needy Weeks in Treatment: 3 Multidisciplinary Care Plan reviewed with physician Active Inactive Abuse / Safety / Falls / Self Care Management Nursing Diagnoses: Potential for falls Potential for injury related to falls Goals: Patient will not experience any injury related to falls Date Initiated: 03/19/2022 Target Resolution Date: 04/18/2022 Goal Status: Active Patient/caregiver will verbalize/demonstrate measures taken to prevent injury and/or falls Date Initiated: 03/19/2022 Target Resolution Date: 04/18/2022 Goal Status: Active Interventions: Assess Activities of Daily Living upon admission and as needed Assess fall risk on admission and as needed Assess: immobility, friction, shearing, incontinence upon admission and as needed Assess impairment of mobility on admission and as needed per policy Assess personal safety and  home safety (as indicated) on admission and as needed Assess self care needs on admission and as needed Provide education on fall prevention Provide education on personal and home safety Notes: Nutrition Nursing Diagnoses: Impaired glucose control: actual or potential Potential for alteratiion in Nutrition/Potential for imbalanced nutrition Goals: Patient/caregiver will maintain therapeutic glucose control Date Initiated: 03/19/2022 Target Resolution Date: 04/18/2022 Goal Status: Active  Interventions: Assess HgA1c results as ordered upon admission and as needed Assess patient nutrition upon admission and as needed per policy Provide education on elevated blood sugars and impact on wound healing Provide education on nutrition Treatment Activities: Education provided on Nutrition : 03/19/2022 Notes: Wound/Skin Impairment Nursing Diagnoses: Impaired tissue integrity Knowledge deficit related to ulceration/compromised skin integrity Goals: Patient/caregiver will verbalize understanding of skin care regimen Date Initiated: 03/19/2022 Target Resolution Date: 04/18/2022 Goal Status: Active Interventions: Assess patient/caregiver ability to obtain necessary supplies Assess patient/caregiver ability to perform ulcer/skin care regimen upon admission and as needed Assess ulceration(s) every visit Provide education on ulcer and skin care Notes: Electronic Signature(s) Signed: 04/10/2022 5:16:25 PM By: Adline Peals Entered By: Adline Peals on 04/10/2022 10:38:20 -------------------------------------------------------------------------------- Pain Assessment Details [name] 04/10/2022 10:00 A M Medical Record Number: [id number] Patient Account Number: [id number] Date of Birth/Sex: Treating RN: 1941/01/23 (81 y.o. [name] Primary Care Loetta Connelley: [name] Other Clinician: Referring Felise Georgia: Treating Blas Riches/Extender:  Benedict Needy Weeks in Treatment: 3 Active Problems Location of Pain Severity and Description of Pain Patient Has Paino Yes Site Locations Pain Location: Generalized Pain Duration of the Pain. Constant / Intermittento Intermittent Rate the pain. Current Pain Level: 8 Character of Pain Describe the Pain: Stabbing Pain Management and Medication Current Pain Management: How does your wound impact your activities of daily livingo Sleep: Yes Electronic Signature(s) Signed: 04/10/2022 5:16:25 PM By: Adline Peals Entered By: Adline Peals on 04/10/2022 10:31:56 -------------------------------------------------------------------------------- Patient/Caregiver Education Details [name] 6/1/2023andnbsp10:00 A M Medical Record Number: [id number] Patient Account Number: [id number] Date of Birth/Gender: Treating RN: 06-19-1941 (81 y.o. [name] Primary Care Physician: [name] Other Clinician: Referring Physician: Treating Physician/Extender: Benedict Needy Weeks in Treatment: 3 Education Assessment Education Provided To: Patient Education Topics Provided Wound/Skin Impairment: Methods: Explain/Verbal Responses: Reinforcements needed, State content correctly Electronic Signature(s) Signed: 04/10/2022 5:16:25 PM By: Adline Peals Entered By: Adline Peals on 04/10/2022 10:38:31 -------------------------------------------------------------------------------- Wound Assessment Details [name] 04/10/2022 10:00 A M Medical Record Number: [id number] Patient Account Number: [id number] Date of Birth/Sex: Treating RN: [birth year] (81 y.o. [name] Primary Care Goldie Dimmer: [name] Other Clinician: Referring Eashan Schipani: Treating Jemell Town/Extender: Benedict Needy Weeks in Treatment: 3 Wound  Status Wound Number: 2 Primary Venous Leg Ulcer Etiology: Wound Location: Right, Anterior Lower Leg Wound Open Wounding Event: Blister Status: Date Acquired: 03/03/2022 Comorbid Cataracts, Chronic sinus problems/congestion, Asthma, Sleep Weeks Of Treatment: 3 History: Apnea, Hypertension, Peripheral Venous Disease, Osteoarthritis Clustered Wound: No Photos Wound Measurements Length: (cm) 1.3 Width: (cm) 2.5 Depth: (cm) 0.1 Area: (cm) 2.553 Volume: (cm) 0.255 % Reduction in Area: 37.5% % Reduction in Volume: 37.5% Epithelialization: Small (1-33%) Tunneling: No Undermining: No Wound Description Classification: Full Thickness Without Exposed Support Structures Wound Margin: Flat and Intact Exudate Amount: Medium Exudate Type: Serosanguineous Exudate Color: red, brown Foul Odor After Cleansing: No Slough/Fibrino Yes Wound Bed Granulation Amount: Large (67-100%) Exposed Structure Granulation Quality: Red, Pink Fascia Exposed: No Necrotic Amount: Small (1-33%) Fat Layer (Subcutaneous Tissue) Exposed: Yes Necrotic Quality: Adherent Slough Tendon Exposed: No Muscle Exposed: No Joint Exposed: No Bone Exposed: No Treatment Notes Wound #2 (Lower Leg) Wound Laterality: Right, Anterior Cleanser Soap and Water Discharge Instruction: May shower and wash wound with dial antibacterial soap and water prior to dressing change. Wound Cleanser Discharge Instruction: Cleanse  the wound with wound cleanser prior to applying a clean dressing using gauze sponges, not tissue or cotton balls. Peri-Wound Care Zinc Oxide Ointment 30g tube Discharge Instruction: Apply Zinc Oxide to periwound with each dressing change Sween Lotion (Moisturizing lotion) Discharge Instruction: Apply moisturizing lotion as directed Topical Primary Dressing KerraCel Ag Gelling Fiber Dressing, 2x2 in (silver alginate) Discharge Instruction: Apply silver alginate to wound bed as instructed Secondary  Dressing ABD Pad, 5x9 Discharge Instruction: Apply over primary dressing as directed. Zetuvit Plus 4x8 in Discharge Instruction: Apply over primary dressing as directed. Secured With Compression Wrap FourPress (4 layer compression wrap) Discharge Instruction: Apply four layer compression as directed. May also use Miliken CoFlex 2 layer compression system as alternative. Compression Stockings Add-Ons Electronic Signature(s) Signed: 04/10/2022 5:16:25 PM By: Adline Peals Entered By: Adline Peals on 04/10/2022 10:41:11 -------------------------------------------------------------------------------- Wound Assessment Details [name] 04/10/2022 10:00 A M Medical Record Number: [id number] Patient Account Number: [id number] Date of Birth/Sex: Treating RN: Aug 14, 1941 (81 y.o. [name] Primary Care Stevon Gough: [name] Other Clinician: Referring Nancie Bocanegra: Treating Katarzyna Wolven/Extender: Benedict Needy Weeks in Treatment: 3 Wound Status Wound Number: 3 Primary Neuropathic Ulcer-Non Diabetic Etiology: Wound Location: Right, Lateral T Great oe Wound Open Wounding Event: Blister Status: Date Acquired: 03/10/2022 Comorbid Cataracts, Chronic sinus problems/congestion, Asthma, Sleep Weeks Of Treatment: 3 History: Apnea, Hypertension, Peripheral Venous Disease, Osteoarthritis Clustered Wound: No Photos Wound Measurements Length: (cm) 0.5 Width: (cm) 0.3 Depth: (cm) 0.1 Area: (cm) 0.118 Volume: (cm) 0.012 Wound Description Classification: Full Thickness Without Exposed Support Struct Wound Margin: Flat and Intact Exudate Amount: Medium Exudate Type: Serosanguineous Exudate Color: red, brown Foul Odor After Cleansing: Slough/Fibrino % Reduction in Area: 93.2% % Reduction in Volume: 93.1% Epithelialization: Medium (34-66%) Tunneling: No Undermining: No ures No No Wound Bed Granulation  Amount: Large (67-100%) Exposed Structure Granulation Quality: Red Fascia Exposed: No Necrotic Amount: None Present (0%) Fat Layer (Subcutaneous Tissue) Exposed: Yes Tendon Exposed: No Muscle Exposed: No Joint Exposed: No Bone Exposed: No Treatment Notes Wound #3 (Toe Great) Wound Laterality: Right, Lateral Cleanser Soap and Water Discharge Instruction: May shower and wash wound with dial antibacterial soap and water prior to dressing change. Wound Cleanser Discharge Instruction: Cleanse the wound with wound cleanser prior to applying a clean dressing using gauze sponges, not tissue or cotton balls. Peri-Wound Care Topical Primary Dressing KerraCel Ag Gelling Fiber Dressing, 2x2 in (silver alginate) Discharge Instruction: Apply silver alginate to wound bed as instructed Secondary Dressing Woven Gauze Sponges 2x2 in Discharge Instruction: Apply over primary dressing as directed. Secured With Conforming Stretch Gauze Bandage, Sterile 2x75 (in/in) Discharge Instruction: Secure with stretch gauze as directed. 84M Medipore Soft Cloth Surgical T 2x10 (in/yd) ape Discharge Instruction: Secure with tape as directed. Compression Wrap Compression Stockings Add-Ons Electronic Signature(s) Signed: 04/10/2022 5:16:25 PM By: Adline Peals Entered By: Adline Peals on 04/10/2022 10:41:46 -------------------------------------------------------------------------------- Wound Assessment Details [name] 04/10/2022 10:00 A M Medical Record Number: [id number] Patient Account Number: [id number] Date of Birth/Sex: Treating RN: Jan 09, 1941 (81 y.o. [name] Primary Care Colum Colt: [name] Other Clinician: Referring Yang Rack: Treating Shanera Meske/Extender: Benedict Needy Weeks in Treatment: 3 Wound Status Wound Number: 4 Primary Neuropathic Ulcer-Non Diabetic Etiology: Wound Location: Right, Distal T  Great oe Wound Open Wounding Event: Gradually Appeared Status: Date Acquired: 11/10/2021 Comorbid Cataracts, Chronic sinus problems/congestion, Asthma, Sleep Weeks Of Treatment: 3 History: Apnea, Hypertension, Peripheral  Venous Disease, Osteoarthritis Clustered Wound: No Photos Wound Measurements Length: (cm) 0.1 Width: (cm) 0.1 Depth: (cm) 0.1 Area: (cm) 0.008 Volume: (cm) 0.001 % Reduction in Area: 88.7% % Reduction in Volume: 92.9% Epithelialization: Large (67-100%) Tunneling: No Undermining: No Wound Description Classification: Full Thickness Without Exposed Support Structures Wound Margin: Well defined, not attached Exudate Amount: None Present Foul Odor After Cleansing: No Slough/Fibrino No Wound Bed Granulation Amount: None Present (0%) Exposed Structure Necrotic Amount: None Present (0%) Fascia Exposed: No Fat Layer (Subcutaneous Tissue) Exposed: No Tendon Exposed: No Muscle Exposed: No Joint Exposed: No Bone Exposed: No Treatment Notes Wound #4 (Toe Great) Wound Laterality: Right, Distal Cleanser Soap and Water Discharge Instruction: May shower and wash wound with dial antibacterial soap and water prior to dressing change. Wound Cleanser Discharge Instruction: Cleanse the wound with wound cleanser prior to applying a clean dressing using gauze sponges, not tissue or cotton balls. Peri-Wound Care Topical Primary Dressing KerraCel Ag Gelling Fiber Dressing, 2x2 in (silver alginate) Discharge Instruction: Apply silver alginate to wound bed as instructed Secondary Dressing Drawtex 4x4 in Discharge Instruction: Apply over primary dressing as directed. Woven Gauze Sponges 2x2 in Discharge Instruction: Apply over primary dressing as directed. Secured With Conforming Stretch Gauze Bandage, Sterile 2x75 (in/in) Discharge Instruction: Secure with stretch gauze as directed. 25M Medipore Soft Cloth Surgical T 2x10 (in/yd) ape Discharge Instruction: Secure with  tape as directed. Compression Wrap Compression Stockings Add-Ons Electronic Signature(s) Signed: 04/10/2022 6:12:29 PM By: Levan Hurst RN, BSN Entered By: Levan Hurst on 04/10/2022 10:58:52 -------------------------------------------------------------------------------- Wound Assessment Details [name] 04/10/2022 10:00 A M Medical Record Number: [id number] Patient Account Number: [id number] Date of Birth/Sex: Treating RN: 10-17-1941 (81 y.o. [name] Primary Care Telicia Hodgkiss: [name] Other Clinician: Referring Richele Strand: Treating Yahsir Wickens/Extender: Benedict Needy Weeks in Treatment: 3 Wound Status Wound Number: 5 Primary Venous Leg Ulcer Etiology: Wound Location: Right, Distal, Anterior Lower Leg Wound Open Wounding Event: Blister Status: Date Acquired: 04/02/2022 Comorbid Cataracts, Chronic sinus problems/congestion, Asthma, Sleep Weeks Of Treatment: 1 History: Apnea, Hypertension, Peripheral Venous Disease, Osteoarthritis Clustered Wound: Yes Photos Wound Measurements Length: (cm) 3 Width: (cm) 1 Depth: (cm) 0 Clustered Quantity: 2 Area: (cm) Volume: (cm) % Reduction in Area: -175.2% .1 % Reduction in Volume: -175.5% .1 Epithelialization: Small (1-33%) Tunneling: No 2.592 Undermining: No 0.259 Wound Description Classification: Full Thickness Without Exposed Support Structures Wound Margin: Flat and Intact Exudate Amount: Medium Exudate Type: Serous Exudate Color: amber Foul Odor After Cleansing: No Slough/Fibrino No Wound Bed Granulation Amount: Large (67-100%) Exposed Structure Granulation Quality: Pink, Pale Fascia Exposed: No Necrotic Amount: None Present (0%) Fat Layer (Subcutaneous Tissue) Exposed: Yes Tendon Exposed: No Muscle Exposed: No Joint Exposed: No Bone Exposed: No Treatment Notes Wound #5 (Lower Leg) Wound Laterality: Right, Anterior,  Distal Cleanser Soap and Water Discharge Instruction: May shower and wash wound with dial antibacterial soap and water prior to dressing change. Wound Cleanser Discharge Instruction: Cleanse the wound with wound cleanser prior to applying a clean dressing using gauze sponges, not tissue or cotton balls. Peri-Wound Care Zinc Oxide Ointment 30g tube Discharge Instruction: Apply Zinc Oxide to periwound with each dressing change Sween Lotion (Moisturizing lotion) Discharge Instruction: Apply moisturizing lotion as directed Topical Primary Dressing KerraCel Ag Gelling Fiber Dressing, 2x2 in (silver alginate) Discharge Instruction: Apply silver alginate to wound bed as instructed Secondary Dressing ABD Pad, 5x9 Discharge Instruction: Apply over primary dressing as directed. Zetuvit Plus 4x8  in Discharge Instruction: Apply over primary dressing as directed. Secured With Compression Wrap FourPress (4 layer compression wrap) Discharge Instruction: Apply four layer compression as directed. May also use Miliken CoFlex 2 layer compression system as alternative. Compression Stockings Add-Ons Electronic Signature(s) Signed: 04/10/2022 5:16:25 PM By: Adline Peals Entered By: Adline Peals on 04/10/2022 10:42:31 -------------------------------------------------------------------------------- Wound Assessment Details [name] 04/10/2022 10:00 A M Medical Record Number: [id number] Patient Account Number: [id number] Date of Birth/Sex: Treating RN: July 17, 1941 (81 y.o. [name] Primary Care Burley Kopka: [name] Other Clinician: Referring Laaibah Wartman: Treating Chanceler Pullin/Extender: Benedict Needy Weeks in Treatment: 3 Wound Status Wound Number: 6 Primary Venous Leg Ulcer Etiology: Wound Location: Right, Lateral, Anterior Lower Leg Wound Open Wounding Event: Blister Status: Date Acquired: 04/02/2022 Comorbid  Cataracts, Chronic sinus problems/congestion, Asthma, Sleep Weeks Of Treatment: 1 History: Apnea, Hypertension, Peripheral Venous Disease, Osteoarthritis Clustered Wound: No Photos Wound Measurements Length: (cm) 1 Width: (cm) 1 Depth: (cm) 0.1 Area: (cm) 0.785 Volume: (cm) 0.079 % Reduction in Area: -185.5% % Reduction in Volume: -192.6% Epithelialization: Small (1-33%) Tunneling: No Undermining: No Wound Description Classification: Full Thickness Without Exposed Support Structures Wound Margin: Flat and Intact Exudate Amount: Medium Exudate Type: Serosanguineous Exudate Color: red, brown Foul Odor After Cleansing: No Slough/Fibrino No Wound Bed Granulation Amount: Large (67-100%) Exposed Structure Granulation Quality: Red, Pink Fascia Exposed: No Necrotic Amount: None Present (0%) Fat Layer (Subcutaneous Tissue) Exposed: Yes Tendon Exposed: No Muscle Exposed: No Joint Exposed: No Bone Exposed: No Treatment Notes Wound #6 (Lower Leg) Wound Laterality: Right, Lateral, Anterior Cleanser Soap and Water Discharge Instruction: May shower and wash wound with dial antibacterial soap and water prior to dressing change. Wound Cleanser Discharge Instruction: Cleanse the wound with wound cleanser prior to applying a clean dressing using gauze sponges, not tissue or cotton balls. Peri-Wound Care Zinc Oxide Ointment 30g tube Discharge Instruction: Apply Zinc Oxide to periwound with each dressing change Sween Lotion (Moisturizing lotion) Discharge Instruction: Apply moisturizing lotion as directed Topical Primary Dressing KerraCel Ag Gelling Fiber Dressing, 2x2 in (silver alginate) Discharge Instruction: Apply silver alginate to wound bed as instructed Secondary Dressing ABD Pad, 5x9 Discharge Instruction: Apply over primary dressing as directed. Zetuvit Plus 4x8 in Discharge Instruction: Apply over primary dressing as directed. Secured With Compression Wrap FourPress (4  layer compression wrap) Discharge Instruction: Apply four layer compression as directed. May also use Miliken CoFlex 2 layer compression system as alternative. Compression Stockings Add-Ons Electronic Signature(s) Signed: 04/10/2022 5:16:25 PM By: Adline Peals Entered By: Adline Peals on 04/10/2022 10:42:50 -------------------------------------------------------------------------------- Vitals Details [name] 04/10/2022 10:00 A M Medical Record Number: [id number] Patient Account Number: [id number] Date of Birth/Sex: Treating RN: July 21, 1941 (80 y.o. [name] Primary Care Gisell Buehrle: [name] Other Clinician: Referring Nailani Full: Treating Jaimere Feutz/Extender: Benedict Needy Weeks in Treatment: 3 Vital Signs Time Taken: 10:31 Temperature (F): 98.5 Height (in): 69 Pulse (bpm): 68 Weight (lbs): 250 Respiratory Rate (breaths/min): 18 Body Mass Index (BMI): 36.9 Blood Pressure (mmHg): 160/66 Reference Range: 80 - 120 mg / dl Electronic Signature(s) Signed: 04/10/2022 5:16:25 PM By: Adline Peals Entered By: Adline Peals on 04/10/2022 10:32:12

## 2022-04-10 NOTE — Progress Notes (Signed)
TYION, BOYLEN ([id number]) Visit Report for 04/10/2022 Chief Complaint Document Details Patient Name: [name]: [name], [name] [name] 04/10/2022 10:00 A M Medical Record Number: [id number] Patient Account Number: [id number] Date of Birth/Sex: Treating RN: 04-10-1941 (81 y.o. [name] Primary Care Provider: [name] Other Clinician: Referring Provider: Treating Provider/Extender: Benedict Needy Weeks in Treatment: 3 Information Obtained from: Patient Chief Complaint 02/07/2021; patient is here for review of a wound on the tip of his right great toe 03/19/2022: re-opening of the right great toe wound, with additional wounds on medial aspect of right great toe and right anterior tibial surface Electronic Signature(s) Signed: 04/10/2022 11:02:30 AM By: Fredirick Maudlin MD FACS Entered By: Fredirick Maudlin on 04/10/2022 11:02:29 -------------------------------------------------------------------------------- Debridement Details Patient Name: [name]: [name] Banker [name] 04/10/2022 10:00 A M Medical Record Number: [id number] Patient Account Number: [id number] Date of Birth/Sex: Treating RN: Aug 22, 1941 (81 y.o. [name] Primary Care Provider: [name] Other Clinician: Referring Provider: Treating Provider/Extender: Benedict Needy Weeks in Treatment: 3 Debridement Performed for Assessment: Wound #6 Right,Lateral,Anterior Lower Leg Performed By: Physician Fredirick Maudlin, MD Debridement Type: Debridement Severity of Tissue Pre Debridement: Fat layer exposed Level of Consciousness (Pre-procedure): Awake and Alert Pre-procedure Verification/Time Out Yes - 10:52 Taken: Start Time: 10:52 T Area Debrided (L x W): otal 1 (cm) x 1 (cm) = 1 (cm) Tissue and other material debrided: Non-Viable, Slough, Biofilm, Slough Level: Non-Viable Tissue Debridement Description: Selective/Open Wound Instrument: Curette Bleeding:  Minimum Hemostasis Achieved: Pressure End Time: 10:53 Procedural Pain: 0 Post Procedural Pain: 0 Response to Treatment: Procedure was tolerated well Level of Consciousness (Post- Awake and Alert procedure): Post Debridement Measurements of Total Wound Length: (cm) 1 Width: (cm) 1 Depth: (cm) 0.1 Volume: (cm) 0.079 Character of Wound/Ulcer Post Debridement: Improved Severity of Tissue Post Debridement: Fat layer exposed Post Procedure Diagnosis Same as Pre-procedure Electronic Signature(s) Signed: 04/10/2022 12:38:17 PM By: Fredirick Maudlin MD FACS Signed: 04/10/2022 6:12:29 PM By: Levan Hurst RN, BSN Entered By: Levan Hurst on 04/10/2022 10:52:54 -------------------------------------------------------------------------------- Debridement Details Patient Name: [name]: [name] Banker [name] 04/10/2022 10:00 A M Medical Record Number: [id number] Patient Account Number: [id number] Date of Birth/Sex: Treating RN: July 17, 1941 (81 y.o. [name] Primary Care Provider: [name] Other Clinician: Referring Provider: Treating Provider/Extender: Benedict Needy Weeks in Treatment: 3 Debridement Performed for Assessment: Wound #2 Right,Anterior Lower Leg Performed By: Physician Fredirick Maudlin, MD Debridement Type: Debridement Severity of Tissue Pre Debridement: Fat layer exposed Level of Consciousness (Pre-procedure): Awake and Alert Pre-procedure Verification/Time Out Yes - 10:52 Taken: Start Time: 10:53 T Area Debrided (L x W): otal 1.3 (cm) x 2.5 (cm) = 3.25 (cm) Tissue and other material debrided: Non-Viable, Slough, Biofilm, Slough Level: Non-Viable Tissue Debridement Description: Selective/Open Wound Instrument: Curette Bleeding: Minimum Hemostasis Achieved: Pressure End Time: 10:54 Procedural Pain: 0 Post Procedural Pain: 0 Response to Treatment: Procedure was tolerated well Level of Consciousness (Post- Awake and  Alert procedure): Post Debridement Measurements of Total Wound Length: (cm) 1.3 Width: (cm) 2.5 Depth: (cm) 0.1 Volume: (cm) 0.255 Character of Wound/Ulcer Post Debridement: Improved Severity of Tissue Post Debridement: Fat layer exposed Post Procedure Diagnosis Same as Pre-procedure Electronic Signature(s) Signed: 04/10/2022 12:38:17 PM By: Fredirick Maudlin MD FACS Signed: 04/10/2022 6:12:29 PM By: Levan Hurst RN, BSN Entered By: Levan Hurst on 04/10/2022 10:54:49 -------------------------------------------------------------------------------- Debridement Details Patient Name: [name]: [name] Banker [name] 04/10/2022 10:00 A  M Medical Record Number: [id number] Patient Account Number: [id number] Date of Birth/Sex: Treating RN: April 16, 1941 (81 y.o. [name] Primary Care Provider: [name] Other Clinician: Referring Provider: Treating Provider/Extender: Benedict Needy Weeks in Treatment: 3 Debridement Performed for Assessment: Wound #3 Right,Lateral T Great oe Performed By: Physician Fredirick Maudlin, MD Debridement Type: Debridement Level of Consciousness (Pre-procedure): Awake and Alert Pre-procedure Verification/Time Out Yes - 10:52 Taken: Start Time: 10:54 T Area Debrided (L x W): otal 0.5 (cm) x 0.3 (cm) = 0.15 (cm) Tissue and other material debrided: Non-Viable, Eschar Level: Non-Viable Tissue Debridement Description: Selective/Open Wound Instrument: Curette Bleeding: Minimum Hemostasis Achieved: Pressure End Time: 10:55 Procedural Pain: 0 Post Procedural Pain: 0 Response to Treatment: Procedure was tolerated well Level of Consciousness (Post- Awake and Alert procedure): Post Debridement Measurements of Total Wound Length: (cm) 0.5 Width: (cm) 0.3 Depth: (cm) 0.1 Volume: (cm) 0.012 Character of Wound/Ulcer Post Debridement: Improved Post Procedure Diagnosis Same as Pre-procedure Electronic Signature(s) Signed:  04/10/2022 12:38:17 PM By: Fredirick Maudlin MD FACS Signed: 04/10/2022 6:12:29 PM By: Levan Hurst RN, BSN Entered By: Levan Hurst on 04/10/2022 10:55:29 -------------------------------------------------------------------------------- Debridement Details Patient Name: [name]: [name] Banker [name] 04/10/2022 10:00 A M Medical Record Number: [id number] Patient Account Number: [id number] Date of Birth/Sex: Treating RN: [birth year] (81 y.o. [name] Primary Care Provider: [name] Other Clinician: Referring Provider: Treating Provider/Extender: Benedict Needy Weeks in Treatment: 3 Debridement Performed for Assessment: Wound #4 Right,Distal T Great oe Performed By: Physician Fredirick Maudlin, MD Debridement Type: Debridement Level of Consciousness (Pre-procedure): Awake and Alert Pre-procedure Verification/Time Out Yes - 10:52 Taken: Start Time: 10:55 T Area Debrided (L x W): otal 0.2 (cm) x 0.2 (cm) = 0.04 (cm) Tissue and other material debrided: Non-Viable, Callus Level: Non-Viable Tissue Debridement Description: Selective/Open Wound Instrument: Curette Bleeding: Minimum Hemostasis Achieved: Pressure End Time: 10:56 Procedural Pain: 0 Post Procedural Pain: 0 Response to Treatment: Procedure was tolerated well Level of Consciousness (Post- Awake and Alert procedure): Post Debridement Measurements of Total Wound Length: (cm) 0.1 Width: (cm) 0.1 Depth: (cm) 0.1 Volume: (cm) 0.001 Character of Wound/Ulcer Post Debridement: Improved Post Procedure Diagnosis Same as Pre-procedure Electronic Signature(s) Signed: 04/10/2022 12:38:17 PM By: Fredirick Maudlin MD FACS Signed: 04/10/2022 6:12:29 PM By: Levan Hurst RN, BSN Entered By: Levan Hurst on 04/10/2022 10:58:41 -------------------------------------------------------------------------------- HPI Details Patient Name: [name]: [name] Banker [name] 04/10/2022 10:00 A M Medical  Record Number: [id number] Patient Account Number: [id number] Date of Birth/Sex: Treating RN: 1941/03/01 (81 y.o. [name] Primary Care Provider: [name] Other Clinician: Referring Provider: Treating Provider/Extender: Benedict Needy Weeks in Treatment: 3 History of Present Illness HPI Description: ADMISSION 02/07/2021 This is a 80 year old man who is not a diabetic "prediabetic" who comes in today letter asking [place] to look at an area on his right first toe tip that has been there about a year. He has been getting this dressed that [employer] and he had Xeroform on this with gauze. The patient is not exactly sure how this came about but he is not able to dress the wound himself because of limitations due to what sounds like spinal stenosis and pain. He had a wound on the ankle as well but that healed over. He was seen by vascular in May 2021. This was related to a right great toe wound. He had an angiogram that was completely normal on both sides good perfusion right into  the feet. He also looks like he has lymphedema and he has compression stockings but he cannot get them on and off so he essentially leaves he is on even when he showering Past medical history includes prediabetes, low back pain secondary I think the lumbar spinal stenosis has been offered a decompressive laminectomy but he has not gone forth with this. 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 ABI was noncompressible in our clinic on the right right first toe. 4/7; patient I admitted the clinic last week. He has an area on the tip of his right first toe. We cleaned this off last week and have been using silver collagen. He is in the same crocs today that he wore last week and I told him not to use. For 1 reason or another he could not handle a surgical sandal. 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 He is not  a diabetic 4/12; right first toe wound chronic. We have been using silver collagen. Once again he comes in then the same pair of old crocs that he comes in every week. 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 His x-ray suggested suspicious for osteomyelitis with bone irregularity at the tuft of the right great toe 4/21; patient has been using silver collagen every other day to the wound on his right great toe. He continues to wear crocs. He has no complaints or issues today. 5/5; 2-week follow-up. 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 Mild soft tissue edema with enhancement at the distal aspect of the great toe suggestive of cellulitis. Fortunately the wound actually looks better. Has been using Hydrofera Blue 5/12; started him on Augmentin last week 875 twice daily he is tolerating this well. Last creatinine I see is 1.77 in early April. We are using Hydrofera Blue to the wound which actually looks better today. 5/17; tolerating the Augmentin well. He says the wound is actually been hurting him episodically. Actually the surface of this looks quite good. He did not get his lab work done. We are using [facility] he is changing this himself 6/2; still taking Augmentin he should be rounding into his last week. This was empirically for underlying osteomyelitis. The area on the tip of his toe is still open still with some depth but no palpable bone. We have been using Hydrofera Blue. Surface area of the wound has not been making much improvement Is work was essentially unremarkable except for a creatinine of 1.79. Reason for his renal insufficiency is not clear he is not a diabetic does not have hypertension however he does take NSAIDs and Lasix I wonder if this is the issue here. Note that his creatinine on 11/12/2018 was 1.45. Platelet count slightly low at 143 however his inflammatory  markers were really in the normal range at 10 and 2 sedimentation rate and C-reactive protein respectively. 6/9; he apparently had another 2 weeks of Augmentin after we look that this last time. Therefore he should be coming into his last week next week. We are using polymen on the tip of the toe. He cannot get down to do the dressing so he comes in for a nurse visit. He comes in in crocs I have warned him against this although he says he is using a different  shoe for most of the day he works in [employer]. 6/16; he comes back in in crocs. I have spoken him about this before. His areas on the tip of his toe this actually looks quite good we have been using polymen. He has completed his antibiotics which we empirically gave him for underlying osteomyelitis. Everything looks better to me in spite of his noncompliance with footwear recommendation 7/11; patient has been followed through nurse visits. He was last seen by provider on 6/16. He reports improvement to the wound with the use of PolyMem. He denies signs of infection. 7/21; the patient arrives with the area on the tip of his right great toe completely healed. He has the same proximal on that I see every time. I treated him empirically with Augmentin for osteomyelitis. He needs to be vigilant about this. READMISSION 03/19/2022 The patient returns with a reopening of the right great toe wound. Apparently this occurred in around October of last year. He was followed by podiatry for some time. They have performed x-rays but unfortunately, we do not have access to them or the results. No report as to whether or not he might have osteomyelitis. In addition, he has a new wound on the medial aspect of the right great toe as well as the anterior tibial surface of his right lower leg. He does state that he was wearing compression stockings when this tibial wound occurred. He continues to wear crocs. 04/02/2022: For some reason, the patient has not been  seen in 2 weeks. He says that he had to reschedule his visit. Therefore his Coflex compression wrap was left in situ for 14 days. It appears that his skin became macerated and he has opened up several new superficial wounds on his right anterior tibial surface. We did get an x-ray to evaluate for osteomyelitis and none was seen. All of the pre-existing wounds are smaller today and the new wounds look like they are limited to just the breakdown of skin. 04/10/2022: The wounds on his right anterior tibial surface are actually a little bit larger today. He seems to have some skin irritation and maceration in the area again. The tip of his toe is nearly closed as is the wound on the dorsal surface of his right great toe. Electronic Signature(s) Signed: 04/10/2022 11:03:48 AM By: Fredirick Maudlin MD FACS Entered By: Fredirick Maudlin on 04/10/2022 11:03:48 -------------------------------------------------------------------------------- Physical Exam Details Patient Name: [name]: [name] Banker [name] 04/10/2022 10:00 A M Medical Record Number: [id number] Patient Account Number: [id number] Date of Birth/Sex: Treating RN: 01-04-1941 (81 y.o. [name] Primary Care Provider: [name] Other Clinician: Referring Provider: Treating Provider/Extender: Benedict Needy Weeks in Treatment: 3 Constitutional He is hypertensive, but asymptomatic.. . . . No acute distress. Respiratory Normal work of breathing on room air. Notes 04/10/2022: The wounds on his right anterior tibial surface are actually a little bit larger today. He seems to have some skin irritation and maceration in the area again. The tip of his toe is nearly closed, as is the wound on the dorsal surface of his right great toe. Electronic Signature(s) Signed: 04/10/2022 11:04:34 AM By: Fredirick Maudlin MD FACS Entered By: Fredirick Maudlin on 04/10/2022  11:04:34 -------------------------------------------------------------------------------- Physician Orders Details Patient Name: [name]: [name] Banker [name] 04/10/2022 10:00 A M Medical Record Number: [id number] Patient Account Number: [id number] Date of Birth/Sex: Treating RN: Oct 15, 1941 (81 y.o. 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 - Dr. Celine Ahr - Room 2 - Tuesday 6/6 at 10:00 Return A Bathing/ Shower/ Hygiene May shower with protection but do not get wound dressing(s) wet. - Ok to use cast protector Edema Control - Lymphedema / SCD / Other Elevate legs to the level of the heart or above for 30 minutes daily and/or when sitting, a frequency of: - throughout the day Avoid standing for long periods of time. Exercise regularly Moisturize legs daily. - left leg Compression stocking or Garment 20-30 mm/Hg pressure to: - left leg daily Wound Treatment Wound #2 - Lower Leg Wound Laterality: Right, Anterior Cleanser: Soap and Water 1 x Per Week Discharge Instructions: May shower and wash wound with dial antibacterial soap and water prior to dressing change. Cleanser: Wound Cleanser 1 x Per Week Discharge Instructions: Cleanse the wound with wound cleanser prior to applying a clean dressing using gauze sponges, not tissue or cotton balls. Peri-Wound Care: Zinc Oxide Ointment 30g  tube 1 x Per Week Discharge Instructions: Apply Zinc Oxide to periwound with each dressing change Peri-Wound Care: Sween Lotion (Moisturizing lotion) 1 x Per Week Discharge Instructions: Apply moisturizing lotion as directed Prim Dressing: KerraCel Ag Gelling Fiber Dressing, 2x2 in (silver alginate) 1 x Per Week ary Discharge Instructions: Apply silver alginate to wound bed as instructed Secondary Dressing: ABD Pad, 5x9 1 x Per Week Discharge Instructions: Apply over primary dressing as directed. Secondary Dressing: Zetuvit Plus 4x8 in 1 x Per Week Discharge Instructions: Apply over primary dressing as directed. Compression Wrap: FourPress (4 layer compression wrap) 1 x Per Week Discharge Instructions: Apply four layer compression as directed. May also use Miliken CoFlex 2 layer compression system as alternative. Wound #3 - T Great oe Wound Laterality: Right, Lateral Cleanser: Soap and Water 1 x Per Week Discharge Instructions: May shower and wash wound with dial antibacterial soap and water prior to dressing change. Cleanser: Wound Cleanser 1 x Per Week Discharge Instructions: Cleanse the wound with wound cleanser prior to applying a clean dressing using gauze sponges, not tissue or cotton balls. Prim Dressing: KerraCel Ag Gelling Fiber Dressing, 2x2 in (silver alginate) 1 x Per Week ary Discharge Instructions: Apply silver alginate to wound bed as instructed Secondary Dressing: Woven Gauze Sponges 2x2 in 1 x Per Week Discharge Instructions: Apply over primary dressing as directed. Secured With: Child psychotherapist, Sterile 2x75 (in/in) 1 x Per Week Discharge Instructions: Secure with stretch gauze as directed. Secured With: 82M Medipore Public affairs consultant Surgical T 2x10 (in/yd) 1 x Per Week ape Discharge Instructions: Secure with tape as directed. Wound #4 - T Great oe Wound Laterality: Right, Distal Cleanser: Soap and Water 1 x Per Week Discharge Instructions: May shower and  wash wound with dial antibacterial soap and water prior to dressing change. Cleanser: Wound Cleanser 1 x Per Week Discharge Instructions: Cleanse the wound with wound cleanser prior to applying a clean dressing using gauze sponges, not tissue or cotton balls. Prim Dressing: KerraCel Ag Gelling Fiber Dressing, 2x2 in (silver alginate) 1 x  Per Week ary Discharge Instructions: Apply silver alginate to wound bed as instructed Secondary Dressing: Drawtex 4x4 in 1 x Per Week Discharge Instructions: Apply over primary dressing as directed. Secondary Dressing: Woven Gauze Sponges 2x2 in 1 x Per Week Discharge Instructions: Apply over primary dressing as directed. Secured With: Child psychotherapist, Sterile 2x75 (in/in) 1 x Per Week Discharge Instructions: Secure with stretch gauze as directed. Secured With: 87M Medipore Public affairs consultant Surgical T 2x10 (in/yd) 1 x Per Week ape Discharge Instructions: Secure with tape as directed. Wound #5 - Lower Leg Wound Laterality: Right, Anterior, Distal Cleanser: Soap and Water 1 x Per Week Discharge Instructions: May shower and wash wound with dial antibacterial soap and water prior to dressing change. Cleanser: Wound Cleanser 1 x Per Week Discharge Instructions: Cleanse the wound with wound cleanser prior to applying a clean dressing using gauze sponges, not tissue or cotton balls. Peri-Wound Care: Zinc Oxide Ointment 30g tube 1 x Per Week Discharge Instructions: Apply Zinc Oxide to periwound with each dressing change Peri-Wound Care: Sween Lotion (Moisturizing lotion) 1 x Per Week Discharge Instructions: Apply moisturizing lotion as directed Prim Dressing: KerraCel Ag Gelling Fiber Dressing, 2x2 in (silver alginate) 1 x Per Week ary Discharge Instructions: Apply silver alginate to wound bed as instructed Secondary Dressing: ABD Pad, 5x9 1 x Per Week Discharge Instructions: Apply over primary dressing as directed. Secondary Dressing: Zetuvit Plus 4x8  in 1 x Per Week Discharge Instructions: Apply over primary dressing as directed. Compression Wrap: FourPress (4 layer compression wrap) 1 x Per Week Discharge Instructions: Apply four layer compression as directed. May also use Miliken CoFlex 2 layer compression system as alternative. Wound #6 - Lower Leg Wound Laterality: Right, Lateral, Anterior Cleanser: Soap and Water 1 x Per Week Discharge Instructions: May shower and wash wound with dial antibacterial soap and water prior to dressing change. Cleanser: Wound Cleanser 1 x Per Week Discharge Instructions: Cleanse the wound with wound cleanser prior to applying a clean dressing using gauze sponges, not tissue or cotton balls. Peri-Wound Care: Zinc Oxide Ointment 30g tube 1 x Per Week Discharge Instructions: Apply Zinc Oxide to periwound with each dressing change Peri-Wound Care: Sween Lotion (Moisturizing lotion) 1 x Per Week Discharge Instructions: Apply moisturizing lotion as directed Prim Dressing: KerraCel Ag Gelling Fiber Dressing, 2x2 in (silver alginate) 1 x Per Week ary Discharge Instructions: Apply silver alginate to wound bed as instructed Secondary Dressing: ABD Pad, 5x9 1 x Per Week Discharge Instructions: Apply over primary dressing as directed. Secondary Dressing: Zetuvit Plus 4x8 in 1 x Per Week Discharge Instructions: Apply over primary dressing as directed. Compression Wrap: FourPress (4 layer compression wrap) 1 x Per Week Discharge Instructions: Apply four layer compression as directed. May also use Miliken CoFlex 2 layer compression system as alternative. Electronic Signature(s) Signed: 04/10/2022 12:38:17 PM By: Fredirick Maudlin MD FACS Entered By: Fredirick Maudlin on 04/10/2022 11:04:53 -------------------------------------------------------------------------------- Problem List Details Patient Name: [name]: [name] Banker [name] 04/10/2022 10:00 A M Medical Record Number: [id number] Patient Account Number:  [id number] Date of Birth/Sex: Treating RN: [birth year] (81 y.o. [name] Primary Care Provider: [name] Other Clinician: Referring Provider: Treating Provider/Extender: Benedict Needy Weeks in Treatment: 3 Active Problems ICD-10 Encounter Code Description Active Date MDM Diagnosis [phone] Non-pressure chronic ulcer of other part of right foot with fat layer exposed 03/19/2022 No Yes L97.812 Non-pressure chronic ulcer of other part of right lower leg with fat layer 03/19/2022 No  Yes exposed I87.2 Venous insufficiency (chronic) (peripheral) 03/19/2022 No Yes N18.30 Chronic kidney disease, stage 3 unspecified 03/19/2022 No Yes E66.01 Morbid (severe) obesity due to excess calories 03/19/2022 No Yes R73.03 Prediabetes 03/19/2022 No Yes Inactive Problems Resolved Problems Electronic Signature(s) Signed: 04/10/2022 11:02:07 AM By: Fredirick Maudlin MD FACS Entered By: Fredirick Maudlin on 04/10/2022 11:02:07 -------------------------------------------------------------------------------- Progress Note Details Patient Name: [name]: [name] Banker [name] 04/10/2022 10:00 A M Medical Record Number: [id number] Patient Account Number: [id number] Date of Birth/Sex: Treating RN: 1941/06/28 (81 y.o. [name] Primary Care Provider: [name] Other Clinician: Referring Provider: Treating Provider/Extender: Benedict Needy Weeks in Treatment: 3 Subjective Chief Complaint Information obtained from Patient 02/07/2021; patient is here for review of a wound on the tip of his right great toe 03/19/2022: re-opening of the right great toe wound, with additional wounds on medial aspect of right great toe and right anterior tibial surface History of Present Illness (HPI) ADMISSION 02/07/2021 This is a 81 year old man who is not a diabetic "prediabetic" who comes in today letter asking [place] to look at an area on his right first toe tip that  has been there about a year. He has been getting this dressed that [place] and he had Xeroform on this with gauze. The patient is not exactly sure how this came about but he is not able to dress the wound himself because of limitations due to what sounds like spinal stenosis and pain. He had a wound on the ankle as well but that healed over. He was seen by vascular in May 2021. This was related to a right great toe wound. He had an angiogram that was completely normal on both sides good perfusion right into the feet. He also looks like he has lymphedema and he has compression stockings but he cannot get them on and off so he essentially leaves he is on even when he showering Past medical history includes prediabetes, low back pain secondary I think the lumbar spinal stenosis has been offered a decompressive laminectomy but he has not gone forth with this. 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 ABI was noncompressible in our clinic on the right right first toe. 4/7; patient I admitted the clinic last week. He has an area on the tip of his right first toe. We cleaned this off last week and have been using silver collagen. He is in the same crocs today that he wore last week and I told him not to use. For 1 reason or another he could not handle a surgical sandal. 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 He is not a diabetic 4/12; right first toe wound chronic. We have been using silver collagen. Once again he comes in then the same pair of old crocs that he comes in every week. 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 His x-ray suggested suspicious for osteomyelitis with bone irregularity at the tuft of the right great toe 4/21; patient has been using silver collagen every other day to the wound on his right great toe. He continues to wear crocs. He has no  complaints or issues today. 5/5; 2-week follow-up. 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 Mild soft tissue edema with enhancement at the distal aspect of  the great toe suggestive of cellulitis. Fortunately the wound actually looks better. Has been using Hydrofera Blue 5/12; started him on Augmentin last week 875 twice daily he is tolerating this well. Last creatinine I see is 1.77 in early April. We are using Hydrofera Blue to the wound which actually looks better today. 5/17; tolerating the Augmentin well. He says the wound is actually been hurting him episodically. Actually the surface of this looks quite good. He did not get his lab work done. We are using [facility] he is changing this himself 6/2; still taking Augmentin he should be rounding into his last week. This was empirically for underlying osteomyelitis. The area on the tip of his toe is still open still with some depth but no palpable bone. We have been using Hydrofera Blue. Surface area of the wound has not been making much improvement Is work was essentially unremarkable except for a creatinine of 1.79. Reason for his renal insufficiency is not clear he is not a diabetic does not have hypertension however he does take NSAIDs and Lasix I wonder if this is the issue here. Note that his creatinine on 11/12/2018 was 1.45. Platelet count slightly low at 143 however his inflammatory markers were really in the normal range at 10 and 2 sedimentation rate and C-reactive protein respectively. 6/9; he apparently had another 2 weeks of Augmentin after we look that this last time. Therefore he should be coming into his last week next week. We are using polymen on the tip of the toe. He cannot get down to do the dressing so he comes in for a nurse visit. He comes in in crocs I have warned him against this although he says he is using a different shoe for most of the day he works in [employer]. 6/16; he comes back in in crocs. I have spoken him about this before. His areas on the tip of his toe this actually looks quite good we have been using polymen. He has completed his antibiotics which we empirically gave him for underlying osteomyelitis. Everything looks better to me in spite of his noncompliance with footwear recommendation 7/11; patient has been followed through nurse visits. He was last seen by provider on 6/16. He reports improvement to the wound with the use of PolyMem. He denies signs of infection. 7/21; the patient arrives with the area on the tip of his right great toe completely healed. He has the same proximal on that I see every time. I treated him empirically with Augmentin for osteomyelitis. He needs to be vigilant about this. READMISSION 03/19/2022 The patient returns with a reopening of the right great toe wound. Apparently this occurred in around October of last year. He was followed by podiatry for some time. They have performed x-rays but unfortunately, we do not have access to them or the results. No report as to whether or not he might have osteomyelitis. In addition, he has a new wound on the medial aspect of the right great toe as well as the anterior tibial surface of his right lower leg. He does state that he was wearing compression stockings when this tibial wound occurred. He continues to wear crocs. 04/02/2022: For some reason, the patient has not been seen in 2 weeks. He says that he had to reschedule his visit. Therefore his Coflex compression wrap was left in situ for 14 days. It appears that his skin became macerated and he has opened up several new superficial wounds on his  right anterior tibial surface. We did get an x-ray to evaluate for osteomyelitis and none was seen. All of the pre-existing wounds are smaller today and the new wounds look like they are limited to just the breakdown of skin. 04/10/2022: The wounds on his right anterior  tibial surface are actually a little bit larger today. He seems to have some skin irritation and maceration in the area again. The tip of his toe is nearly closed as is the wound on the dorsal surface of his right great toe. Patient History Information obtained from Patient. Family History Diabetes - Maternal Grandparents, Heart Disease - Father, Hypertension - Father, Stroke - Father, Thyroid Problems - Mother,Child, No family history of Cancer, Hereditary Spherocytosis, Kidney Disease, Lung Disease, Seizures, Tuberculosis. Social History Never smoker, Marital Status - Single, Alcohol Use - Rarely, Drug Use - No History, Caffeine Use - Daily - coffee. Medical History Eyes Patient has history of Cataracts - 2019 Ear/Nose/Mouth/Throat Patient has history of Chronic sinus problems/congestion Respiratory Patient has history of Asthma, Sleep Apnea Cardiovascular Patient has history of Hypertension, Peripheral Venous Disease Musculoskeletal Patient has history of Osteoarthritis Medical A Surgical History Notes nd Gastrointestinal GERD Genitourinary BPH Musculoskeletal Degenerative disc disease Neurologic Neuromuscular disorder Objective Constitutional He is hypertensive, but asymptomatic.[name] No acute distress. Vitals Time Taken: 10:31 AM, Height: 69 in, Weight: 250 lbs, BMI: 36.9, Temperature: 98.5 F, Pulse: 68 bpm, Respiratory Rate: 18 breaths/min, Blood Pressure: 160/66 mmHg. Respiratory Normal work of breathing on room air. General Notes: 04/10/2022: The wounds on his right anterior tibial surface are actually a little bit larger today. He seems to have some skin irritation and maceration in the area again. The tip of his toe is nearly closed, as is the wound on the dorsal surface of his right great toe. Integumentary (Hair, Skin) Wound #2 status is Open. Original cause of wound was Blister. The date acquired was: 03/03/2022. The wound has been in treatment 3 weeks. The wound  is located on the Right,Anterior Lower Leg. The wound measures 1.3cm length x 2.5cm width x 0.1cm depth; 2.553cm^2 area and 0.255cm^3 volume. There is Fat Layer (Subcutaneous Tissue) exposed. There is no tunneling or undermining noted. There is a medium amount of serosanguineous drainage noted. The wound margin is flat and intact. There is large (67-100%) red, pink granulation within the wound bed. There is a small (1-33%) amount of necrotic tissue within the wound bed including Adherent Slough. Wound #3 status is Open. Original cause of wound was Blister. The date acquired was: 03/10/2022. The wound has been in treatment 3 weeks. The wound is located on the Right,Lateral T Great. The wound measures 0.5cm length x 0.3cm width x 0.1cm depth; 0.118cm^2 area and 0.012cm^3 volume. There is Fat oe Layer (Subcutaneous Tissue) exposed. There is no tunneling or undermining noted. There is a medium amount of serosanguineous drainage noted. The wound margin is flat and intact. There is large (67-100%) red granulation within the wound bed. There is no necrotic tissue within the wound bed. Wound #4 status is Open. Original cause of wound was Gradually Appeared. The date acquired was: 11/10/2021. The wound has been in treatment 3 weeks. The wound is located on the Right,Distal T [place]. The wound measures 0.1cm length x 0.1cm width x 0.1cm depth; 0.008cm^2 area and 0.001cm^3 volume. There oe is no tunneling or undermining noted. There is a none present amount of drainage noted. The wound margin is well defined and not attached to the wound base. There is no  granulation within the wound bed. There is no necrotic tissue within the wound bed. Wound #5 status is Open. Original cause of wound was Blister. The date acquired was: 04/02/2022. The wound has been in treatment 1 weeks. The wound is located on the Right,Distal,Anterior Lower Leg. The wound measures 3cm length x 1.1cm width x 0.1cm depth; 2.592cm^2 area and  0.259cm^3 volume. There is Fat Layer (Subcutaneous Tissue) exposed. There is no tunneling or undermining noted. There is a medium amount of serous drainage noted. The wound margin is flat and intact. There is large (67-100%) pink, pale granulation within the wound bed. There is no necrotic tissue within the wound bed. Wound #6 status is Open. Original cause of wound was Blister. The date acquired was: 04/02/2022. The wound has been in treatment 1 weeks. The wound is located on the Right,Lateral,Anterior Lower Leg. The wound measures 1cm length x 1cm width x 0.1cm depth; 0.785cm^2 area and 0.079cm^3 volume. There is Fat Layer (Subcutaneous Tissue) exposed. There is no tunneling or undermining noted. There is a medium amount of serosanguineous drainage noted. The wound margin is flat and intact. There is large (67-100%) red, pink granulation within the wound bed. There is no necrotic tissue within the wound bed. 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 There was a Selective/Open Wound Non-Viable Tissue Debridement with a total area of 3.25 sq cm performed by Fredirick Maudlin, MD. With the following instrument(s): Curette to remove Non-Viable tissue/material. Material removed includes Slough and Biofilm and. No specimens were taken. A time out was conducted at 10:52, prior to the start of the procedure. A Minimum amount of bleeding was controlled with Pressure. The procedure was tolerated well with a pain level of 0 throughout  and a pain level of 0 following the procedure. Post Debridement Measurements: 1.3cm length x 2.5cm width x 0.1cm depth; 0.255cm^3 volume. Character of Wound/Ulcer Post Debridement is improved. Severity of Tissue Post Debridement is: Fat layer exposed. Post procedure Diagnosis Wound #2: Same as Pre-Procedure Pre-procedure diagnosis of Wound #2 is a Venous Leg Ulcer located on the Right,Anterior Lower Leg . There was a Four Layer Compression Therapy Procedure by Levan Hurst, RN. 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 There was a Selective/Open Wound Non- oe Viable Tissue Debridement with a total area of 0.15 sq cm performed by Fredirick Maudlin, MD. With the following instrument(s): Curette to remove Non-Viable tissue/material. Material removed includes Eschar. No specimens were taken. A time out was conducted at 10:52, prior to the start of the procedure. A Minimum amount of bleeding was controlled with Pressure. The procedure was tolerated well with a pain level of 0 throughout and a pain level of 0 following the procedure. Post Debridement Measurements: 0.5cm length x 0.3cm width x 0.1cm depth; 0.012cm^3 volume. Character of Wound/Ulcer Post Debridement is improved. Post procedure Diagnosis Wound #3: Same as Pre-Procedure Wound #4 Pre-procedure diagnosis of Wound #4 is a Neuropathic Ulcer-Non Diabetic located on the Right,Distal T Great .  There was a Selective/Open Wound Non- oe Viable Tissue Debridement with a total area of 0.04 sq cm performed by Fredirick Maudlin, MD. With the following instrument(s): Curette to remove Non-Viable tissue/material. Material removed includes Callus. No specimens were taken. A time out was conducted at 10:52, prior to the start of the procedure. A Minimum amount of bleeding was controlled with Pressure. The procedure was  tolerated well with a pain level of 0 throughout and a pain level of 0 following the procedure. Post Debridement Measurements: 0.1cm length x 0.1cm width x 0.1cm depth; 0.001cm^3 volume. Character of Wound/Ulcer Post Debridement is improved. Post procedure Diagnosis Wound #4: Same as Pre-Procedure Wound #6 Pre-procedure diagnosis of Wound #6 is a Venous Leg Ulcer located on the Right,Lateral,Anterior Lower Leg .Severity of Tissue Pre Debridement is: Fat layer exposed. There was a Selective/Open Wound Non-Viable Tissue Debridement with a total area of 1 sq cm performed by Fredirick Maudlin, MD. With the following instrument(s): Curette to remove Non-Viable tissue/material. Material removed includes Slough and Biofilm and. No specimens were taken. A time out was conducted at 10:52, prior to the start of the procedure. A Minimum amount of bleeding was controlled with Pressure. The procedure was tolerated well with a pain level of 0 throughout and a pain level of 0 following the procedure. Post Debridement Measurements: 1cm length x 1cm width x 0.1cm depth; 0.079cm^3 volume. Character of Wound/Ulcer Post Debridement is improved. Severity of Tissue Post Debridement is: Fat layer exposed. Post procedure Diagnosis Wound #6: Same as Pre-Procedure Pre-procedure diagnosis of Wound #6 is a Venous Leg Ulcer located on the Right,Lateral,Anterior Lower Leg . There was a Four Layer Compression Therapy Procedure by Levan Hurst, RN. Post procedure Diagnosis Wound #6: Same as Pre-Procedure Wound #5 Pre-procedure diagnosis of Wound #5 is a Venous Leg Ulcer located on the Right,Distal,Anterior Lower Leg . There was a Four Layer Compression Therapy Procedure by Levan Hurst, RN. Post procedure Diagnosis Wound #5: Same as Pre-Procedure Plan Follow-up Appointments: Return Appointment in 1 week. - Dr. Celine Ahr - Room 2 - Tuesday 6/6 at 10:00 Bathing/ Shower/ Hygiene: May shower with protection but do not get wound  dressing(s) wet. - Ok to use cast protector Edema Control - Lymphedema / SCD / Other: Elevate legs to the level of the heart or above for 30 minutes daily and/or when sitting, a frequency of: - throughout the day Avoid standing for long periods of time. Exercise regularly Moisturize legs daily. - left leg Compression stocking or Garment 20-30 mm/Hg pressure to: - left leg daily WOUND #2: - Lower Leg Wound Laterality: Right, Anterior Cleanser: Soap and Water 1 x Per Week/ Discharge Instructions: May shower and wash wound with dial antibacterial soap and water prior to dressing change. Cleanser: Wound Cleanser 1 x Per Week/ Discharge Instructions: Cleanse the wound with wound cleanser prior to applying a clean dressing using gauze sponges, not tissue or cotton balls. Peri-Wound Care: Zinc Oxide Ointment 30g tube 1 x Per Week/ Discharge Instructions: Apply Zinc Oxide to periwound with each dressing change Peri-Wound Care: Sween Lotion (Moisturizing lotion) 1 x Per Week/ Discharge Instructions: Apply moisturizing lotion as directed Prim Dressing: KerraCel Ag Gelling Fiber Dressing, 2x2 in (silver alginate) 1 x Per Week/ ary Discharge Instructions: Apply silver alginate to wound bed as instructed Secondary Dressing: ABD Pad, 5x9 1 x Per Week/ Discharge Instructions: Apply over primary dressing as directed. Secondary Dressing: Zetuvit Plus 4x8 in 1 x Per Week/ Discharge Instructions: Apply over primary dressing as directed.  Com pression Wrap: FourPress (4 layer compression wrap) 1 x Per Week/ Discharge Instructions: Apply four layer compression as directed. May also use Miliken CoFlex 2 layer compression system as alternative. WOUND #3: - T Great Wound Laterality: Right, Lateral oe Cleanser: Soap and Water 1 x Per Week/ Discharge Instructions: May shower and wash wound with dial antibacterial soap and water prior to dressing change. Cleanser: Wound Cleanser 1 x Per Week/ Discharge  Instructions: Cleanse the wound with wound cleanser prior to applying a clean dressing using gauze sponges, not tissue or cotton balls. Prim Dressing: KerraCel Ag Gelling Fiber Dressing, 2x2 in (silver alginate) 1 x Per Week/ ary Discharge Instructions: Apply silver alginate to wound bed as instructed Secondary Dressing: Woven Gauze Sponges 2x2 in 1 x Per Week/ Discharge Instructions: Apply over primary dressing as directed. Secured With: Child psychotherapist, Sterile 2x75 (in/in) 1 x Per Week/ Discharge Instructions: Secure with stretch gauze as directed. Secured With: 37M Medipore Public affairs consultant Surgical T 2x10 (in/yd) 1 x Per Week/ ape Discharge Instructions: Secure with tape as directed. WOUND #4: - T Great Wound Laterality: Right, Distal oe Cleanser: Soap and Water 1 x Per Week/ Discharge Instructions: May shower and wash wound with dial antibacterial soap and water prior to dressing change. Cleanser: Wound Cleanser 1 x Per Week/ Discharge Instructions: Cleanse the wound with wound cleanser prior to applying a clean dressing using gauze sponges, not tissue or cotton balls. Prim Dressing: KerraCel Ag Gelling Fiber Dressing, 2x2 in (silver alginate) 1 x Per Week/ ary Discharge Instructions: Apply silver alginate to wound bed as instructed Secondary Dressing: Drawtex 4x4 in 1 x Per Week/ Discharge Instructions: Apply over primary dressing as directed. Secondary Dressing: Woven Gauze Sponges 2x2 in 1 x Per Week/ Discharge Instructions: Apply over primary dressing as directed. Secured With: Child psychotherapist, Sterile 2x75 (in/in) 1 x Per Week/ Discharge Instructions: Secure with stretch gauze as directed. Secured With: 37M Medipore Public affairs consultant Surgical T 2x10 (in/yd) 1 x Per Week/ ape Discharge Instructions: Secure with tape as directed. WOUND #5: - Lower Leg Wound Laterality: Right, Anterior, Distal Cleanser: Soap and Water 1 x Per Week/ Discharge Instructions: May  shower and wash wound with dial antibacterial soap and water prior to dressing change. Cleanser: Wound Cleanser 1 x Per Week/ Discharge Instructions: Cleanse the wound with wound cleanser prior to applying a clean dressing using gauze sponges, not tissue or cotton balls. Peri-Wound Care: Zinc Oxide Ointment 30g tube 1 x Per Week/ Discharge Instructions: Apply Zinc Oxide to periwound with each dressing change Peri-Wound Care: Sween Lotion (Moisturizing lotion) 1 x Per Week/ Discharge Instructions: Apply moisturizing lotion as directed Prim Dressing: KerraCel Ag Gelling Fiber Dressing, 2x2 in (silver alginate) 1 x Per Week/ ary Discharge Instructions: Apply silver alginate to wound bed as instructed Secondary Dressing: ABD Pad, 5x9 1 x Per Week/ Discharge Instructions: Apply over primary dressing as directed. Secondary Dressing: Zetuvit Plus 4x8 in 1 x Per Week/ Discharge Instructions: Apply over primary dressing as directed. Com pression Wrap: FourPress (4 layer compression wrap) 1 x Per Week/ Discharge Instructions: Apply four layer compression as directed. May also use Miliken CoFlex 2 layer compression system as alternative. WOUND #6: - Lower Leg Wound Laterality: Right, Lateral, Anterior Cleanser: Soap and Water 1 x Per Week/ Discharge Instructions: May shower and wash wound with dial antibacterial soap and water prior to dressing change. Cleanser: Wound Cleanser 1 x Per Week/ Discharge Instructions: Cleanse the wound with wound cleanser prior  to applying a clean dressing using gauze sponges, not tissue or cotton balls. Peri-Wound Care: Zinc Oxide Ointment 30g tube 1 x Per Week/ Discharge Instructions: Apply Zinc Oxide to periwound with each dressing change Peri-Wound Care: Sween Lotion (Moisturizing lotion) 1 x Per Week/ Discharge Instructions: Apply moisturizing lotion as directed Prim Dressing: KerraCel Ag Gelling Fiber Dressing, 2x2 in (silver alginate) 1 x Per Week/ ary Discharge  Instructions: Apply silver alginate to wound bed as instructed Secondary Dressing: ABD Pad, 5x9 1 x Per Week/ Discharge Instructions: Apply over primary dressing as directed. Secondary Dressing: Zetuvit Plus 4x8 in 1 x Per Week/ Discharge Instructions: Apply over primary dressing as directed. Com pression Wrap: FourPress (4 layer compression wrap) 1 x Per Week/ Discharge Instructions: Apply four layer compression as directed. May also use Miliken CoFlex 2 layer compression system as alternative. 04/10/2022: The wounds on his right anterior tibial surface are actually a little bit larger today. He seems to have some skin irritation and maceration in the area again. The tip of his toe is nearly closed, as is the wound on the dorsal surface of his right great toe. I used a curette to debride slough from the wounds on his anterior tibial surface and eschar and slough from the toe wounds. He may be having a reaction to the foam component of the Coflex wrap and therefore I am going to put him in traditional 4-layer compression over silver alginate. Follow-up in 1 week. Electronic Signature(s) Signed: 04/10/2022 11:05:30 AM By: Fredirick Maudlin MD FACS Entered By: Fredirick Maudlin on 04/10/2022 11:05:30 -------------------------------------------------------------------------------- HxROS Details Patient Name: [name]: [name] Banker [name] 04/10/2022 10:00 A M Medical Record Number: [id number] Patient Account Number: [id number] Date of Birth/Sex: Treating RN: [birth year] (81 y.o. [name] Primary Care Provider: [name] Other Clinician: Referring Provider: Treating Provider/Extender: Benedict Needy Weeks in Treatment: 3 Information Obtained From Patient Eyes Medical History: Positive for: Cataracts - 2019 Ear/Nose/Mouth/Throat Medical History: Positive for: Chronic sinus problems/congestion Respiratory Medical History: Positive for: Asthma; Sleep  Apnea Cardiovascular Medical History: Positive for: Hypertension; Peripheral Venous Disease Gastrointestinal Medical History: Past Medical History Notes: GERD Genitourinary Medical History: Past Medical History Notes: BPH Musculoskeletal Medical History: Positive for: Osteoarthritis Past Medical History Notes: Degenerative disc disease Neurologic Medical History: Past Medical History Notes: Neuromuscular disorder HBO Extended History Items Ear/Nose/Mouth/Throat: Eyes: Chronic sinus Cataracts problems/congestion Immunizations Pneumococcal Vaccine: Received Pneumococcal Vaccination: Yes Received Pneumococcal Vaccination On or After 60th Birthday: No Implantable Devices None Family and Social History Cancer: No; Diabetes: Yes - Maternal Grandparents; Heart Disease: Yes - Father; Hereditary Spherocytosis: No; Hypertension: Yes - Father; Kidney Disease: No; Lung Disease: No; Seizures: No; Stroke: Yes - Father; Thyroid Problems: Yes - Mother,Child; Tuberculosis: No; Never smoker; Marital Status - Single; Alcohol Use: Rarely; Drug Use: No History; Caffeine Use: Daily - coffee; Financial Concerns: No; Food, Clothing or Shelter Needs: No; Support System Lacking: No; Transportation Concerns: No Electronic Signature(s) Signed: 04/10/2022 12:38:17 PM By: Fredirick Maudlin MD FACS Signed: 04/10/2022 6:12:29 PM By: Levan Hurst RN, BSN Entered By: Fredirick Maudlin on 04/10/2022 11:03:53 -------------------------------------------------------------------------------- SuperBill Details Patient Name: [name]: [name] Banker [name] 04/10/2022 Medical Record Number: [id number] Patient Account Number: [id number] Date of Birth/Sex: Treating RN: 1941-08-02 (81 y.o. [name] Primary Care Provider: [name] Other Clinician: Referring Provider: Treating Provider/Extender: Benedict Needy Weeks in Treatment: 3 Diagnosis Coding ICD-10 Codes Code  Description [phone] Non-pressure chronic ulcer of other part  of right foot with fat layer exposed L97.812 Non-pressure chronic ulcer of other part of right lower leg with fat layer exposed I87.2 Venous insufficiency (chronic) (peripheral) N18.30 Chronic kidney disease, stage 3 unspecified E66.01 Morbid (severe) obesity due to excess calories R73.03 Prediabetes Facility Procedures CPT4 Code: [id number] Description: [phone] - DEBRIDE WOUND 1ST 20 SQ CM OR < ICD-10 Diagnosis Description L97.512 Non-pressure chronic ulcer of other part of right foot with fat layer exposed L97.812 Non-pressure chronic ulcer of other part of right lower leg with fat layer  expos Modifier: ed Quantity: 1 Physician Procedures : CPT4 Code Description Modifier [id number] - WC PHYS LEVEL 3 - EST PT 25 ICD-10 Diagnosis Description L97.512 Non-pressure chronic ulcer of other part of right foot with fat layer exposed L97.812 Non-pressure chronic ulcer of other part of right  lower leg with fat layer exposed 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

## 2022-04-11 DIAGNOSIS — N481 Balanitis: Secondary | ICD-10-CM | POA: Diagnosis not present

## 2022-04-11 NOTE — Telephone Encounter (Signed)
[employer] provider form received via fax for [employer].  Completed provider form faxed to [employer] [phone].  Confirmation received.

## 2022-04-14 ENCOUNTER — Telehealth: Payer: Self-pay

## 2022-04-14 NOTE — Care Plan (Signed)
Ortho Bundle Case Management Note  Patient Details  Name: [name] MRN: [id number] Date of Birth: 01-01-1941  Spoke with patient prior to surgery. Lives alone. Son lives in [place] and Daughter lives in [place]. No one to come and stay with him. Rolling walker ordered for home use OPPT set up with [place]. He will arrange transportation per conversation today. Declined home therapy. MD updated.            DME Arranged:  Gilford Rile rolling DME Agency:  [employer]  HH Arranged:    [place] Agency:     Additional Comments: Please contact me with any questions of if this plan should need to change.  Ladell Heads,  [facility]  [phone] 04/14/2022, 5:05 PM

## 2022-04-14 NOTE — Telephone Encounter (Signed)
   Pre-operative Risk Assessment    Patient Name: [name]  DOB: [birth year] MRN: [id number]      Request for Surgical Clearance    Procedure:   Left Total Hip Arthroplasty  Date of Surgery:  Clearance 04/18/22                                 Surgeon:  Lowella Petties, MD Surgeon's Group or Practice Name:  [facility] Phone number:  [phone] Fax number:  [phone] [name]   Type of Clearance Requested:   - Medical    Type of Anesthesia:  Spinal   Additional requests/questions:    SignedWonda Horner   04/14/2022, 8:56 AM

## 2022-04-14 NOTE — Telephone Encounter (Signed)
   Name: [name]  DOB: [birth year]  MRN: [id number]  Primary Cardiologist: Quay Burow, MD  Chart reviewed as part of pre-operative protocol coverage. Because of [name]'s past medical history and time since last visit, he will require a follow-up in-office visit in order to better assess preoperative cardiovascular risk.  Pre-op covering staff: - Please schedule appointment and call patient to inform them. If patient already had an upcoming appointment within acceptable timeframe, please add "pre-op clearance" to the appointment notes so provider is aware. - Please contact requesting surgeon's office via preferred method (i.e, phone, fax) to inform them of need for appointment prior to surgery.   Patient is overdue for cardiology follow-up, last visit was in 2021.  [place], [place]  04/14/2022, 8:07 PM

## 2022-04-15 ENCOUNTER — Encounter (HOSPITAL_BASED_OUTPATIENT_CLINIC_OR_DEPARTMENT_OTHER): Payer: Medicare Other | Admitting: General Surgery

## 2022-04-15 ENCOUNTER — Inpatient Hospital Stay (HOSPITAL_COMMUNITY): Admission: RE | Admit: 2022-04-15 | Payer: Medicare Other | Source: Ambulatory Visit

## 2022-04-15 DIAGNOSIS — L97511 Non-pressure chronic ulcer of other part of right foot limited to breakdown of skin: Secondary | ICD-10-CM | POA: Diagnosis not present

## 2022-04-15 DIAGNOSIS — K219 Gastro-esophageal reflux disease without esophagitis: Secondary | ICD-10-CM | POA: Diagnosis not present

## 2022-04-15 DIAGNOSIS — L139 Bullous disorder, unspecified: Secondary | ICD-10-CM | POA: Diagnosis not present

## 2022-04-15 DIAGNOSIS — R7309 Other abnormal glucose: Secondary | ICD-10-CM | POA: Diagnosis not present

## 2022-04-15 DIAGNOSIS — J4520 Mild intermittent asthma, uncomplicated: Secondary | ICD-10-CM | POA: Diagnosis not present

## 2022-04-15 DIAGNOSIS — M1612 Unilateral primary osteoarthritis, left hip: Secondary | ICD-10-CM | POA: Diagnosis not present

## 2022-04-15 DIAGNOSIS — N1832 Chronic kidney disease, stage 3b: Secondary | ICD-10-CM | POA: Diagnosis not present

## 2022-04-15 DIAGNOSIS — G4733 Obstructive sleep apnea (adult) (pediatric): Secondary | ICD-10-CM | POA: Diagnosis not present

## 2022-04-15 DIAGNOSIS — Z01818 Encounter for other preprocedural examination: Secondary | ICD-10-CM | POA: Diagnosis not present

## 2022-04-15 NOTE — Progress Notes (Signed)
Called and spoke. With pt.  He is aware to have phone call appt on 04/16/22 at 300pm and to come to [facility] on 04/17/22 for labs at 1000am and to pick up preop instrucitons.

## 2022-04-15 NOTE — Telephone Encounter (Signed)
Spoke with patient who is agreeable to see Almyra Deforest, PA-C on 6/8 at 3:35 pm. Patient thanked me for the call.   I will fax recommendations to requested provider's office.

## 2022-04-15 NOTE — Progress Notes (Signed)
DUE TO COVID-19 ONLY  2  VISITOR IS ALLOWED TO COME WITH YOU AND STAY IN THE WAITING ROOM ONLY DURING PRE OP AND PROCEDURE DAY OF SURGERY.   4  04/18/2022 VISITOR  MAY VISIT WITH YOU AFTER SURGERY IN YOUR PRIVATE ROOM DURING VISITING HOURS ONLY! YOU MAY HAVE ONE PERSON SPEND THE NITE WITH YOU IN YOUR ROOM AFTER SURGERY.      Your procedure is scheduled on:    Report to [place]  Entrance   Report to admitting at      0745           AM DO NOT [place], PICTURE ID OR WALLET DAY OF SURGERY.      Call this number if you have problems the morning of surgery [phone]    REMEMBER: NO  SOLID FOODS , CANDY, GUM OR MINTS AFTER [place] .       [name] CLEAR LIQUIDS UNTIL    0730am             DAY OF SURGERY.      PLEASE FINISH ENSURE DRINK PER SURGEON ORDER  WHICH NEEDS TO BE COMPLETED AT   0730am       MORNING OF SURGERY.       CLEAR LIQUID DIET   Foods Allowed      WATER BLACK COFFEE ( SUGAR OK, NO MILK, CREAM OR CREAMER) REGULAR AND DECAF  TEA ( SUGAR OK NO MILK, CREAM, OR CREAMER) REGULAR AND DECAF  PLAIN JELLO ( NO RED)  FRUIT ICES ( NO RED, NO FRUIT PULP)  POPSICLES ( NO RED)  JUICE- APPLE, WHITE GRAPE AND WHITE CRANBERRY  SPORT DRINK LIKE GATORADE ( NO RED)  CLEAR BROTH ( VEGETABLE , CHICKEN OR BEEF)                                                                     BRUSH YOUR TEETH MORNING OF SURGERY AND RINSE YOUR MOUTH OUT, NO CHEWING GUM CANDY OR MINTS.     Take these medicines the morning of surgery with A SIP OF WATER:  inhalers as usual and bireng, wellbutrin, nexium, flonase    DO NOT TAKE ANY DIABETIC MEDICATIONS DAY OF YOUR SURGERY                               You may not have any metal on your body including hair pins and              piercings  Do not wear jewelry, make-up, lotions, powders or perfumes, deodorant             Do not wear nail polish on your fingernails.              IF YOU ARE A MALE AND WANT TO  SHAVE UNDER ARMS OR LEGS PRIOR TO SURGERY YOU MUST DO SO AT LEAST 48 HOURS PRIOR TO SURGERY.              Men may shave face and neck.   Do not bring valuables to the hospital. [facility] IS NOT  RESPONSIBLE   FOR VALUABLES.  Contacts, dentures or bridgework may not be worn into surgery.  Leave suitcase in the car. After surgery it may be brought to your room.     Patients discharged the day of surgery will not be allowed to drive home. IF YOU ARE HAVING SURGERY AND GOING HOME THE SAME DAY, YOU MUST HAVE AN ADULT TO DRIVE YOU HOME AND BE WITH YOU FOR 24 HOURS. YOU MAY GO HOME BY TAXI OR UBER OR ORTHERWISE, BUT AN ADULT MUST ACCOMPANY YOU HOME AND STAY WITH YOU FOR 24 HOURS.                Please read over the following fact sheets you were given: _____________________________________________________________________  [facility] - Preparing for Surgery Before surgery, you can play an important role.  Because skin is not sterile, your skin needs to be as free of germs as possible.  You can reduce the number of germs on your skin by washing with CHG (chlorahexidine gluconate) soap before surgery.  CHG is an antiseptic cleaner which kills germs and bonds with the skin to continue killing germs even after washing. Please DO NOT use if you have an allergy to CHG or antibacterial soaps.  If your skin becomes reddened/irritated stop using the CHG and inform your nurse when you arrive at Short Stay. Do not shave (including legs and underarms) for at least 48 hours prior to the first CHG shower.  You may shave your face/neck. Please follow these instructions carefully:  1.  Shower with CHG Soap the night before surgery and the  morning of Surgery.  2.  If you choose to wash your hair, wash your hair first as usual with your  normal  shampoo.  3.  After you shampoo, rinse your hair and body thoroughly to remove the  shampoo.                           4.  Use CHG as you would any other liquid  soap.  You can apply chg directly  to the skin and wash                       Gently with a scrungie or clean washcloth.  5.  Apply the CHG Soap to your body ONLY FROM THE NECK DOWN.   Do not use on face/ open                           Wound or open sores. Avoid contact with eyes, ears mouth and genitals (private parts).                       Wash face,  Genitals (private parts) with your normal soap.             6.  Wash thoroughly, paying special attention to the area where your surgery  will be performed.  7.  Thoroughly rinse your body with warm water from the neck down.  8.  DO NOT shower/wash with your normal soap after using and rinsing off  the CHG Soap.                9.  Pat yourself dry with a clean towel.            10.  Wear clean pajamas.  11.  Place clean sheets on your bed the night of your first shower and do not  sleep with pets. Day of Surgery : Do not apply any lotions/deodorants the morning of surgery.  Please wear clean clothes to the hospital/surgery center.  FAILURE TO FOLLOW THESE INSTRUCTIONS MAY RESULT IN THE CANCELLATION OF YOUR SURGERY PATIENT SIGNATURE_________________________________  NURSE SIGNATURE__________________________________  ________________________________________________________________________

## 2022-04-15 NOTE — Progress Notes (Addendum)
Anesthesia Review:  PCP: Cardiologist : per note ion epic needs cardiology in person appt for clearance- none has been made as of 04/15/22 am  Pulm- katherine cobb,NP- Lov 04/09/22  Chest x-ray : 03/06/22  EKG : 12/02/21  Echo : 2020  Stress test: 2018  Cardiac Cath :  Activity level:  Sleep Study/ CPAP : Fasting Blood Sugar :      / Checks Blood Sugar -- times a day:   PREDIABETES - does not hceck glucose at home  HGBA1C-04/16/22-  Blood Thinner/ Instructions /Last Dose: ASA / Instructions/ Last Dose :   Also pt has a right great toe wound followed by wound care- LOV 04/10/22 in epic.   PT showed up on 04/16/22 at 1015am for labs.  Admitting arrived pt.  PT came into dept and asked "how long this was going to take ' to lab personnel.  PT showed up day early for lab appt.  Labs and vitals obtained.  Nurse reviewed with pt there would be a 3pm phone call this pm to review med hx and instructions.  Instrucitons along with hibiclens and ensure drink given to pt.  PT asked if phone call could be do another time he might not be available at 3pm.  Informed pt the only appt we had.  PT wanted to know " Can I be done now"  informed pt we have pts here for other appts.  Reminded pt of cardiology appt on 04/17/2022 at 1535 pm for clearance for surgery.  Called and spoke with Myrla Halsted at office and informed of above.  PT has been very demanding at office also.  Darel Hong made aware of cardiology clearance appt on 04/17/22 at 1535 pm.   BMp done 04/16/22 rotued to Dr Jodi Geralds.  1458pm- LVMM for pt to call me to complete his preop appt for upcoming surgery.  Left phone number of [phone].  Pt called preop nurse at 1505 pm.  Medical hx and preop instructions were reviewed.  Discovered that pt is prediabetic per pt and instructed pt not to drink Ensure presurgery drink provided in bag at time of preop labs.  Pt voiced understanding since he is prediabetic.   PT aware that he may call with any questions to  [phone] .  PT voiced understanding.

## 2022-04-16 ENCOUNTER — Other Ambulatory Visit: Payer: Self-pay

## 2022-04-16 ENCOUNTER — Encounter (HOSPITAL_COMMUNITY): Payer: Self-pay

## 2022-04-16 ENCOUNTER — Encounter (HOSPITAL_COMMUNITY)
Admission: RE | Admit: 2022-04-16 | Discharge: 2022-04-16 | Disposition: A | Discharge status: Home / Self Care | Payer: Medicare Other | Source: Ambulatory Visit | Attending: Orthopedic Surgery | Admitting: Orthopedic Surgery

## 2022-04-16 ENCOUNTER — Telehealth: Payer: Self-pay | Admitting: Pulmonary Disease

## 2022-04-16 VITALS — BP 156/77 | HR 65 | Temp 97.9°F | Resp 18 | Ht 69.0 in | Wt 248.0 lb

## 2022-04-16 DIAGNOSIS — I129 Hypertensive chronic kidney disease with stage 1 through stage 4 chronic kidney disease, or unspecified chronic kidney disease: Secondary | ICD-10-CM | POA: Diagnosis present

## 2022-04-16 DIAGNOSIS — F331 Major depressive disorder, recurrent, moderate: Secondary | ICD-10-CM | POA: Diagnosis not present

## 2022-04-16 DIAGNOSIS — Z96652 Presence of left artificial knee joint: Secondary | ICD-10-CM | POA: Diagnosis present

## 2022-04-16 DIAGNOSIS — Z7951 Long term (current) use of inhaled steroids: Secondary | ICD-10-CM | POA: Diagnosis not present

## 2022-04-16 DIAGNOSIS — Z01810 Encounter for preprocedural cardiovascular examination: Secondary | ICD-10-CM | POA: Diagnosis not present

## 2022-04-16 DIAGNOSIS — E569 Vitamin deficiency, unspecified: Secondary | ICD-10-CM | POA: Diagnosis not present

## 2022-04-16 DIAGNOSIS — Z79899 Other long term (current) drug therapy: Secondary | ICD-10-CM | POA: Diagnosis not present

## 2022-04-16 DIAGNOSIS — L03031 Cellulitis of right toe: Secondary | ICD-10-CM | POA: Diagnosis not present

## 2022-04-16 DIAGNOSIS — Z538 Procedure and treatment not carried out for other reasons: Secondary | ICD-10-CM | POA: Diagnosis present

## 2022-04-16 DIAGNOSIS — L03119 Cellulitis of unspecified part of limb: Secondary | ICD-10-CM | POA: Diagnosis not present

## 2022-04-16 DIAGNOSIS — Z01812 Encounter for preprocedural laboratory examination: Secondary | ICD-10-CM | POA: Insufficient documentation

## 2022-04-16 DIAGNOSIS — M1612 Unilateral primary osteoarthritis, left hip: Secondary | ICD-10-CM | POA: Diagnosis not present

## 2022-04-16 DIAGNOSIS — G8929 Other chronic pain: Secondary | ICD-10-CM | POA: Diagnosis not present

## 2022-04-16 DIAGNOSIS — R509 Fever, unspecified: Secondary | ICD-10-CM | POA: Diagnosis not present

## 2022-04-16 DIAGNOSIS — I10 Essential (primary) hypertension: Secondary | ICD-10-CM | POA: Diagnosis not present

## 2022-04-16 DIAGNOSIS — J45909 Unspecified asthma, uncomplicated: Secondary | ICD-10-CM | POA: Insufficient documentation

## 2022-04-16 DIAGNOSIS — Z8249 Family history of ischemic heart disease and other diseases of the circulatory system: Secondary | ICD-10-CM | POA: Diagnosis not present

## 2022-04-16 DIAGNOSIS — R609 Edema, unspecified: Secondary | ICD-10-CM | POA: Diagnosis not present

## 2022-04-16 DIAGNOSIS — Z8349 Family history of other endocrine, nutritional and metabolic diseases: Secondary | ICD-10-CM | POA: Diagnosis not present

## 2022-04-16 DIAGNOSIS — F32A Depression, unspecified: Secondary | ICD-10-CM | POA: Diagnosis present

## 2022-04-16 DIAGNOSIS — R531 Weakness: Secondary | ICD-10-CM | POA: Diagnosis not present

## 2022-04-16 DIAGNOSIS — Z7401 Bed confinement status: Secondary | ICD-10-CM | POA: Diagnosis not present

## 2022-04-16 DIAGNOSIS — G4733 Obstructive sleep apnea (adult) (pediatric): Secondary | ICD-10-CM | POA: Insufficient documentation

## 2022-04-16 DIAGNOSIS — J4530 Mild persistent asthma, uncomplicated: Secondary | ICD-10-CM | POA: Diagnosis not present

## 2022-04-16 DIAGNOSIS — G4730 Sleep apnea, unspecified: Secondary | ICD-10-CM | POA: Diagnosis present

## 2022-04-16 DIAGNOSIS — M868X7 Other osteomyelitis, ankle and foot: Secondary | ICD-10-CM | POA: Diagnosis not present

## 2022-04-16 DIAGNOSIS — R7303 Prediabetes: Secondary | ICD-10-CM | POA: Diagnosis not present

## 2022-04-16 DIAGNOSIS — R600 Localized edema: Secondary | ICD-10-CM | POA: Diagnosis not present

## 2022-04-16 DIAGNOSIS — G479 Sleep disorder, unspecified: Secondary | ICD-10-CM | POA: Diagnosis not present

## 2022-04-16 DIAGNOSIS — E785 Hyperlipidemia, unspecified: Secondary | ICD-10-CM | POA: Diagnosis not present

## 2022-04-16 DIAGNOSIS — Z833 Family history of diabetes mellitus: Secondary | ICD-10-CM | POA: Diagnosis not present

## 2022-04-16 DIAGNOSIS — N183 Chronic kidney disease, stage 3 unspecified: Secondary | ICD-10-CM | POA: Diagnosis not present

## 2022-04-16 DIAGNOSIS — M25552 Pain in left hip: Secondary | ICD-10-CM | POA: Diagnosis not present

## 2022-04-16 DIAGNOSIS — J301 Allergic rhinitis due to pollen: Secondary | ICD-10-CM | POA: Diagnosis not present

## 2022-04-16 DIAGNOSIS — K219 Gastro-esophageal reflux disease without esophagitis: Secondary | ICD-10-CM | POA: Diagnosis present

## 2022-04-16 DIAGNOSIS — Z9842 Cataract extraction status, left eye: Secondary | ICD-10-CM | POA: Diagnosis not present

## 2022-04-16 DIAGNOSIS — R058 Other specified cough: Secondary | ICD-10-CM | POA: Diagnosis not present

## 2022-04-16 DIAGNOSIS — Z01818 Encounter for other preprocedural examination: Secondary | ICD-10-CM

## 2022-04-16 DIAGNOSIS — M86171 Other acute osteomyelitis, right ankle and foot: Secondary | ICD-10-CM | POA: Diagnosis not present

## 2022-04-16 DIAGNOSIS — F411 Generalized anxiety disorder: Secondary | ICD-10-CM | POA: Diagnosis not present

## 2022-04-16 DIAGNOSIS — N1832 Chronic kidney disease, stage 3b: Secondary | ICD-10-CM | POA: Insufficient documentation

## 2022-04-16 DIAGNOSIS — L089 Local infection of the skin and subcutaneous tissue, unspecified: Secondary | ICD-10-CM | POA: Diagnosis not present

## 2022-04-16 DIAGNOSIS — G9332 Myalgic encephalomyelitis/chronic fatigue syndrome: Secondary | ICD-10-CM | POA: Diagnosis not present

## 2022-04-16 DIAGNOSIS — J4520 Mild intermittent asthma, uncomplicated: Secondary | ICD-10-CM | POA: Diagnosis present

## 2022-04-16 DIAGNOSIS — F419 Anxiety disorder, unspecified: Secondary | ICD-10-CM | POA: Diagnosis present

## 2022-04-16 DIAGNOSIS — M1A9XX1 Chronic gout, unspecified, with tophus (tophi): Secondary | ICD-10-CM | POA: Diagnosis present

## 2022-04-16 DIAGNOSIS — K5900 Constipation, unspecified: Secondary | ICD-10-CM | POA: Diagnosis not present

## 2022-04-16 DIAGNOSIS — F418 Other specified anxiety disorders: Secondary | ICD-10-CM | POA: Diagnosis not present

## 2022-04-16 DIAGNOSIS — Z9841 Cataract extraction status, right eye: Secondary | ICD-10-CM | POA: Diagnosis not present

## 2022-04-16 DIAGNOSIS — L97929 Non-pressure chronic ulcer of unspecified part of left lower leg with unspecified severity: Secondary | ICD-10-CM | POA: Diagnosis present

## 2022-04-16 DIAGNOSIS — M25452 Effusion, left hip: Secondary | ICD-10-CM | POA: Diagnosis not present

## 2022-04-16 DIAGNOSIS — M869 Osteomyelitis, unspecified: Secondary | ICD-10-CM | POA: Diagnosis not present

## 2022-04-16 HISTORY — DX: Prediabetes: R73.03

## 2022-04-16 HISTORY — DX: Chronic kidney disease, stage 3 unspecified: N18.30

## 2022-04-16 LAB — CBC
HCT: 42.7 % (ref 39.0–52.0)
Hemoglobin: 14.1 g/dL (ref 13.0–17.0)
MCH: 31.5 pg (ref 26.0–34.0)
MCHC: 33 g/dL (ref 30.0–36.0)
MCV: 95.3 fL (ref 80.0–100.0)
Platelets: 162 10*3/uL (ref 150–400)
RBC: 4.48 MIL/uL (ref 4.22–5.81)
RDW: 13.3 % (ref 11.5–15.5)
WBC: 5.7 10*3/uL (ref 4.0–10.5)
nRBC: 0 % (ref 0.0–0.2)

## 2022-04-16 LAB — HEMOGLOBIN A1C
Hgb A1c MFr Bld: 5.7 % — ABNORMAL HIGH (ref 4.8–5.6)
Mean Plasma Glucose: 116.89 mg/dL

## 2022-04-16 LAB — SURGICAL PCR SCREEN
MRSA, PCR: NEGATIVE
Staphylococcus aureus: NEGATIVE

## 2022-04-16 LAB — BASIC METABOLIC PANEL
Anion gap: 8 (ref 5–15)
BUN: 27 mg/dL — ABNORMAL HIGH (ref 8–23)
CO2: 26 mmol/L (ref 22–32)
Calcium: 9.1 mg/dL (ref 8.9–10.3)
Chloride: 105 mmol/L (ref 98–111)
Creatinine, Ser: 1.71 mg/dL — ABNORMAL HIGH (ref 0.61–1.24)
GFR, Estimated: 40 mL/min — ABNORMAL LOW (ref >60)
Glucose, Bld: 103 mg/dL — ABNORMAL HIGH (ref 70–99)
Potassium: 4 mmol/L (ref 3.5–5.1)
Sodium: 139 mmol/L (ref 135–145)

## 2022-04-17 ENCOUNTER — Encounter: Payer: Self-pay | Admitting: Physician Assistant

## 2022-04-17 ENCOUNTER — Ambulatory Visit (HOSPITAL_COMMUNITY): Payer: Medicare Other | Admitting: Anesthesiology

## 2022-04-17 ENCOUNTER — Ambulatory Visit (INDEPENDENT_AMBULATORY_CARE_PROVIDER_SITE_OTHER): Payer: Medicare Other | Admitting: Physician Assistant

## 2022-04-17 ENCOUNTER — Encounter (HOSPITAL_COMMUNITY): Payer: Medicare Other

## 2022-04-17 ENCOUNTER — Encounter (HOSPITAL_COMMUNITY): Payer: Self-pay | Admitting: Orthopedic Surgery

## 2022-04-17 ENCOUNTER — Encounter (HOSPITAL_COMMUNITY): Payer: Self-pay

## 2022-04-17 VITALS — BP 154/80 | HR 74 | Ht 69.0 in

## 2022-04-17 DIAGNOSIS — N183 Chronic kidney disease, stage 3 unspecified: Secondary | ICD-10-CM | POA: Diagnosis not present

## 2022-04-17 DIAGNOSIS — E785 Hyperlipidemia, unspecified: Secondary | ICD-10-CM | POA: Diagnosis not present

## 2022-04-17 DIAGNOSIS — Z01810 Encounter for preprocedural cardiovascular examination: Secondary | ICD-10-CM | POA: Diagnosis not present

## 2022-04-17 DIAGNOSIS — R7303 Prediabetes: Secondary | ICD-10-CM

## 2022-04-17 DIAGNOSIS — I10 Essential (primary) hypertension: Secondary | ICD-10-CM

## 2022-04-17 DIAGNOSIS — J4530 Mild persistent asthma, uncomplicated: Secondary | ICD-10-CM | POA: Diagnosis not present

## 2022-04-17 NOTE — Telephone Encounter (Signed)
Left URGENT message for [name], surgery scheduler for Dr. Berenice Primas. The showed up in our office almost 1 hour late. [facility], is seeing the pt at this moment. Once the pt has been cleared we will be sure to fax over clearance notes.

## 2022-04-17 NOTE — Patient Instructions (Signed)
Medication Instructions:  No change in medication  *If you need a refill on your cardiac medications before your next appointment, please call your pharmacy*   Lab Work:  No lab work   If you have labs (blood work) drawn today and your tests are completely normal, you will receive your results only by: [place] (if you have MyChart) OR A paper copy in the mail If you have any lab test that is abnormal or we need to change your treatment, we will call you to review the results.   Testing/Procedures: No additional testing   Follow-Up: At [facility], you and your health needs are our priority.  As part of our continuing mission to provide you with exceptional heart care, we have created designated Provider Care Teams.  These Care Teams include your primary Cardiologist (physician) and Advanced Practice Providers (APPs -  Physician Assistants and Nurse Practitioners) who all work together to provide you with the care you need, when you need it.  We recommend signing up for the patient portal called "MyChart".  Sign up information is provided on this After Visit Summary.  MyChart is used to connect with patients for Virtual Visits (Telemedicine).  Patients are able to view lab/test results, encounter notes, upcoming appointments, etc.  Non-urgent messages can be sent to your provider as well.   To learn more about what you can do with MyChart, go to [email or web address].    Your next appointment:   1 year(s)  The format for your next appointment:   In Person  Provider:   Quay Burow, MD     Other Instructions  You are cleared for upcoming surgery.   Important Information About Sugar

## 2022-04-17 NOTE — Progress Notes (Signed)
Anesthesia Chart Review:   Case: [id number] Date/Time: 04/18/22 1015   Procedure: TOTAL HIP ARTHROPLASTY ANTERIOR APPROACH (Left: Hip)   Anesthesia type: Spinal   Pre-op diagnosis: LEFT HIP SEVERE DEGENERATIVE JOINT DISEASE   [facility] ROOM 01 / [facility]   Surgeons: [name], MD       DISCUSSION: Pt is 81 years old with hx HTN, pre-diabetes, OSA, asthma, CKD (stage 3b)  Pt was scheduled for cardiology appt 04/17/22 for clearance for surgery but did not show up. I notified Bethena Roys in Dr. Berenice Primas' office pt does not have clearance for surgery.   VS: BP (!) 156/77   Pulse 65   Temp 36.6 C (Oral)   Resp 18   Ht '5\' 9"'$  (1.753 m)   Wt 112.5 kg   SpO2 95%   BMI 36.62 kg/m   PROVIDERS: - PCP is Cari Caraway, MD - Pulmonologist is Marshell Garfinkel, MD. Last office visit 04/09/22 with [name], NP  - Cardiologist is Quay Burow, MD. Last office visit 06/15/20.   LABS: Labs reviewed: Acceptable for surgery. - Cr 1.71 is consistent with prior results  (all labs ordered are listed, but only abnormal results are displayed)  Labs Reviewed  BASIC METABOLIC PANEL - Abnormal; Notable for the following components:      Result Value   Glucose, Bld 103 (*)    BUN 27 (*)    Creatinine, Ser 1.71 (*)    GFR, Estimated 40 (*)    All other components within normal limits  HEMOGLOBIN A1C - Abnormal; Notable for the following components:   Hgb A1c MFr Bld 5.7 (*)    All other components within normal limits  SURGICAL PCR SCREEN  CBC  TYPE AND SCREEN     IMAGES: CXR 03/06/22:  - Mild central airways thickening may correspond to history of asthma. No focal pneumonia.  CT angio chest 12/02/21:  - No evidence of pulmonary embolus. - Coronary artery disease. - No acute cardiopulmonary disease. - Aortic Atherosclerosis   EKG 12/02/21: sinus rhythm   CV: Echo 08/10/19:  1. Left ventricular ejection fraction, by visual estimation, is 60 to 65%. The left ventricle has normal function.  There is no left ventricular hypertrophy.  2. Left ventricular diastolic Doppler parameters are consistent with impaired relaxation pattern of LV diastolic filling.  3. Global right ventricle has normal systolic function.The right ventricular size is normal. No increase in right ventricular wall thickness.  4. Left atrial size was normal.  5. Right atrial size was normal.  6. The mitral valve is grossly normal. Trace mitral valve regurgitation.  7. The tricuspid valve is grossly normal. Tricuspid valve regurgitation is trivial.  8. The aortic valve is tricuspid Aortic valve regurgitation is mild by color flow Doppler. Mild aortic valve sclerosis without stenosis.  9. The pulmonic valve was grossly normal. Pulmonic valve regurgitation is mild by color flow Doppler.  10. The inferior vena cava is dilated in size with <50% respiratory variability, suggesting right atrial pressure of 15 mmHg.    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   Hyperlipidemia    Joint pain    Neuromuscular disorder ([place])    Pre-diabetes    Prediabetes    Sleep apnea    uses CPAP nightly   SOB (shortness of breath)    Swallowing difficulty    Swelling of lower extremity    right more than leg leg   Umbilical hernia     Past Surgical History:  Procedure Laterality Date   ABDOMINAL AORTOGRAM W/LOWER EXTREMITY N/A 03/14/2020   Procedure: ABDOMINAL AORTOGRAM  W/LOWER EXTREMITY;  Surgeon: Serafina Mitchell, MD;  Location: [facility];  Service: Vascular;  Laterality: N/A;   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   BACK SURGERY  2003   L4, L5   BACK SURGERY     CATARACT EXTRACTION, BILATERAL     ENDOVENOUS ABLATION SAPHENOUS VEIN W/ LASER Right 06-01-2014   EVLA right greater saphenous vein by Curt Jews MD    epidural steroid injections        [facility]   hernia repair  2003   HERNIA REPAIR     hernia inguinal x 2   TOTAL KNEE ARTHROPLASTY Left 03/22/2015   Procedure: TOTAL KNEE ARTHROPLASTY;  Surgeon: Meredith Pel, MD;  Location: [facility];  Service: Orthopedics;  Laterality: Left;   UMBILICAL HERNIA REPAIR N/A 06/27/2016   Procedure: LAPAROSCOPIC ASSISTED REPAIR OF UMBILICAL HERINA WITH MESH;  Surgeon: Johnathan Hausen, MD;  [facility];  Service: General;  Laterality: N/A;   VASCULAR SURGERY  2015   right leg    MEDICATIONS:  albuterol (VENTOLIN HFA) 108 (90 Base) MCG/ACT inhaler   azelastine (ASTELIN) 0.1 % nasal spray   B-D 3CC LUER-LOK SYR 21GX1-1/2 21G X 1-1/2" 3 ML MISC   benzonatate (TESSALON) 200 MG capsule   Budeson-Glycopyrrol-Formoterol (BREZTRI AEROSPHERE) 160-9-4.8 MCG/ACT AERO   Budeson-Glycopyrrol-Formoterol (BREZTRI AEROSPHERE) 160-9-4.8 MCG/ACT AERO   budesonide-formoterol (SYMBICORT) 160-4.5 MCG/ACT inhaler   buPROPion (WELLBUTRIN XL) 300 MG 24 hr tablet   calcium carbonate 200 MG capsule   doxazosin (CARDURA) 8 MG tablet   esomeprazole (NEXIUM) 40 MG capsule   etodolac (LODINE) 400 MG tablet   flurazepam (DALMANE) 15 MG capsule   fluticasone (FLONASE) 50 MCG/ACT nasal spray   furosemide (LASIX) 20 MG tablet   furosemide (LASIX) 40 MG tablet   gabapentin (NEURONTIN) 300 MG capsule   glucosamine-chondroitin 500-400 MG tablet   guaiFENesin (MUCINEX) 600 MG 12 hr tablet   HYDROcodone-acetaminophen (NORCO) 5-325 MG tablet   LORazepam (ATIVAN) 1 MG tablet   meloxicam  (MOBIC) 15 MG tablet   methocarbamol (ROBAXIN) 500 MG tablet   mirtazapine (REMERON) 15 MG tablet   montelukast (SINGULAIR) 10 MG tablet   Multiple Vitamins-Minerals (CENTRUM SILVER) tablet   NEEDLE, DISP, 21 G 21G X 1-1/2" MISC   potassium chloride SA (K-DUR,KLOR-CON) 20 MEQ tablet   predniSONE (DELTASONE) 10 MG tablet   rosuvastatin (CRESTOR) 5 MG tablet   sildenafil (REVATIO) 20 MG tablet   Syringe, Reusable, 3 ML MISC   testosterone cypionate (DEPOTESTOSTERONE CYPIONATE) 200 MG/ML injection   No current facility-administered medications for this encounter.    Willeen Cass, PhD, FNP-BC  [facility] Stay [facility]/Anesthesiology Phone: [phone] 04/17/2022 4:07 PM

## 2022-04-17 NOTE — Telephone Encounter (Signed)
I will update the requesting office the pt did not show for his appt for pre op clearance today. Unable to clear the pt at this time.   Pt will need to call back and reschedule appt for pre op clearance.

## 2022-04-17 NOTE — Telephone Encounter (Signed)
    Patient Name: [name]  DOB: 12-Feb-1941 MRN: [id number]  Primary Cardiologist: Quay Burow, MD  Chart reviewed as part of pre-operative protocol coverage. Given past medical history and time since last visit, based on ACC/AHA guidelines, OLAOLUWA GRIEDER would be at acceptable risk for the planned procedure without further cardiovascular testing.   See office note on 04/17/2022, patient is at acceptable risk to proceed with left total hip surgery.  I did speak with Bethena Roys Dr. Berenice Primas' surgery scheduler prior to talking with my attending so she knows the patient should be cleared for surgery.  I have forwarded my note to Dr. Berenice Primas and also to his office.  A copy of cardiac clearance has been printed out for the patient to bring into with him to tomorrow surgery.  A copy of today's EKG was also given to the patient for tomorrow surgery as well.   The patient was advised that if he develops new symptoms prior to surgery to contact our office to arrange for a follow-up visit, and he verbalized understanding.  I will route this recommendation to the requesting party via Epic fax function and remove from pre-op pool.  Please call with questions.  [place], [place] 04/17/2022, 5:53 PM

## 2022-04-17 NOTE — Progress Notes (Signed)
Cardiology Office Note:    Date:  04/17/2022   ID:  [name], DOB 1941-02-20, MRN [id number]  PCP:  Cari Caraway, MD   [facility] Providers Cardiologist:  Quay Burow, MD     Referring MD: Cari Caraway, MD   Chief Complaint  Patient presents with   Pre-op Exam    Upcoming left total hip surgery by Dr. Berenice Primas    History of Present Illness:    [name] is a 81 y.o. male with a hx of CKD stage III, depression, hypogonadism, intermittent asthma, hypertension, hyperlipidemia, prediabetes, OSA and GERD.  He has a family history with his father having open heart surgery in his 4s.  Cardiac catheterization performed on 06/17/2007 was normal.  He underwent a venous draping by Dr. Donnetta Hutching for venous reflux and swelling.  Nuclear stress test obtained on 02/19/2017 showed EF 45%, mild diffuse hypokinesis, no ischemia was noted, overall considered low risk study.  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  Echocardiogram obtained on 08/10/2019 showed EF normal, grade 1 DD.  Patient was last seen by Dr. Gwenlyn Found on 06/15/2020 at which time he continued to have dyspnea on exertion which was felt to be multifactorial from obesity, diastolic dysfunction and reactive airway disease.  Patient was previously cleared for lumbar fusion surgery in 2021, however he never went for the surgery and he eventually opted to manage his back issue conservatively.  Since the last visit, patient was seen in the ED in January 2023 due to elevated blood pressure after using nasal Afrin.  D-dimer was elevated.  Creatinine 1.61 at the time.  CTA was negative for PE and in no acute cardiopulmonary disease.  Troponin was negative.  He has also been followed by Dr. Vaughan Browner of pulmonology service chronically due to dyspnea on exertion.  He is on breathing treatment.  Patient required steroid therapy and doxycycline in March 2023 for asthma  exacerbation.  Patient presents today for preoperative clearance prior to left total hip replacement by Dr. [name].  Talking with the patient, he denies any recent chest pain or worsening dyspnea.  He has chronic right lower extremity edema after vein stripping, the degree of right lower extremity edema has not changed recently.  He is taking 40 mg daily of Lasix.  Unfortunately, due to significant hip pain, he is unable to accomplish more than 4 METS of activity.  He can barely bear weight and has to walk very slowly with a cane.  He is EKG is unchanged.  Talking with the patient, his functional ability really deteriorated in the past week, prior to the past week, he was able to do fair amount of activity by himself.  He has not had any recent asthma exacerbation in the past 2 months.  He is lung is clear on physical exam.  Heart rate is quite regular.  I discussed his case with DOD Dr. Sallyanne Kuster, he is at acceptable risk to proceed with upcoming left total hip replacement surgery.  I have printed out a copy of his cardiac clearance letter and made a copy of today's EKG as well.   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   Hyperlipidemia    Joint pain    Neuromuscular disorder ([place])    Pre-diabetes    Prediabetes    Sleep apnea    uses CPAP nightly   SOB (shortness of breath)    Swallowing difficulty    Swelling of lower extremity     right more than leg leg   Umbilical hernia     Past Surgical History:  Procedure Laterality Date   ABDOMINAL AORTOGRAM W/LOWER EXTREMITY N/A 03/14/2020   Procedure: ABDOMINAL AORTOGRAM W/LOWER EXTREMITY;  Surgeon: Serafina Mitchell, MD;  Location: [facility];  Service: Vascular;  Laterality: N/A;   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   BACK SURGERY  2003   L4, L5   BACK SURGERY     CATARACT EXTRACTION, BILATERAL     ENDOVENOUS ABLATION SAPHENOUS VEIN W/ LASER Right 06-01-2014   EVLA right greater saphenous vein by Curt Jews MD    epidural steroid injections        [facility] dr [facility]   hernia repair  2003   HERNIA REPAIR     hernia inguinal x 2   TOTAL KNEE ARTHROPLASTY Left 03/22/2015   Procedure: TOTAL KNEE ARTHROPLASTY;  Surgeon: Meredith Pel, MD;  Location: [facility];  Service: Orthopedics;  Laterality: Left;   UMBILICAL HERNIA REPAIR N/A 06/27/2016   Procedure: LAPAROSCOPIC ASSISTED REPAIR OF UMBILICAL HERINA WITH MESH;  Surgeon: Johnathan Hausen, MD;  Location: [facility];  Service: General;  Laterality: N/A;   VASCULAR SURGERY  2015   right leg    Current Medications: No outpatient medications have been marked as taking for the 04/17/22 encounter (Office Visit) with Almyra Deforest, [place].     Allergies:   Patient has no known allergies.   Social History   Socioeconomic History   Marital status: Single    Spouse name: Not on file   Number of children: Not on file   Years of education: Not on file   Highest education level: Not on file  Occupational History   Occupation: Antiques/Real Estate    Employer: RETIRED  Tobacco Use   Smoking status: Never   Smokeless tobacco: Never  Vaping Use   Vaping Use: Never used  Substance and Sexual Activity   Alcohol use: Yes    Comment: rare   Drug use: No   Sexual activity: Yes    Birth control/protection: Condom  Other Topics Concern   Not on file  Social History Narrative   ** Merged History  Encounter **       Single. Education: Other.    Social Determinants of Health   Financial Resource Strain: Not on file  Food Insecurity: Not on file  Transportation Needs: Not on file  Physical Activity: Not on file  Stress: Not on file  Social Connections: Not on file     Family History: The patient's family history includes Diabetes in his maternal grandfather; Heart disease in his father; Hypertension in  his father; Thyroid disease in his mother. There is no history of Colon cancer or Esophageal cancer.  ROS:   Please see the history of present illness.     All other systems reviewed and are negative.  EKGs/Labs/Other Studies Reviewed:    The following studies were reviewed today:  Myoview 02/19/2017 Nuclear stress EF: 45%. Mild diffuse hypokinesis The left ventricular ejection fraction is mildly decreased (45-54%). Note, echocardiogram demonstrates normal ejection fraction. There was no ST segment deviation noted during stress. This is a low risk study. No ischemia identified.   Echo 08/10/2019 1. Left ventricular ejection fraction, by visual estimation, is 60 to  65%. The left ventricle has normal function. There is no left ventricular  hypertrophy.   2. Left ventricular diastolic Doppler parameters are consistent with  impaired relaxation pattern of LV diastolic filling.   3. Global right ventricle has normal systolic function.The right  ventricular size is normal. No increase in right ventricular wall  thickness.   4. Left atrial size was normal.   5. Right atrial size was normal.   6. The mitral valve is grossly normal. Trace mitral valve regurgitation.   7. The tricuspid valve is grossly normal. Tricuspid valve regurgitation  is trivial.   8. The aortic valve is tricuspid Aortic valve regurgitation is mild by  color flow Doppler. Mild aortic valve sclerosis without stenosis.   9. The pulmonic valve was grossly normal. Pulmonic valve regurgitation is  mild by  color flow Doppler.  10. The inferior vena cava is dilated in size with <50% respiratory  variability, suggesting right atrial pressure of 15 mmHg.   EKG:  EKG is ordered today.  The ekg ordered today demonstrates normal sinus rhythm, no significant ST-T wave changes  Recent Labs: 08/07/2021: B Natriuretic Peptide 15.1 12/02/2021: ALT 35 04/16/2022: BUN 27; Creatinine, Ser 1.71; Hemoglobin 14.1; Platelets 162; Potassium 4.0; Sodium 139  Recent Lipid Panel    Component Value Date/Time   CHOL 175 03/11/2016 1340   TRIG 199 (H) 03/11/2016 1340   HDL 38 (L) 03/11/2016 1340   CHOLHDL 4.6 03/11/2016 1340   VLDL 40 (H) 03/11/2016 1340   LDLCALC 97 03/11/2016 1340     Risk Assessment/Calculations:           Physical Exam:    VS:  BP (!) 154/80   Pulse 74   Ht '5\' 9"'$  (1.753 m)   BMI 36.62 kg/m     Wt Readings from Last 3 Encounters:  04/16/22 248 lb (112.5 kg)  04/09/22 258 lb (117 kg)  02/03/22 252 lb (114.3 kg)     GEN:  Well nourished, well developed in no acute distress HEENT: Normal NECK: No JVD; No carotid bruits LYMPHATICS: No lymphadenopathy CARDIAC: RRR, no murmurs, rubs, gallops RESPIRATORY:  Clear to auscultation without rales, wheezing or rhonchi  ABDOMEN: Soft, non-tender, non-distended MUSCULOSKELETAL:  1+ LLE edema and 3+ RLE edema; No deformity  SKIN: Warm and dry NEUROLOGIC:  Alert and oriented x 3 PSYCHIATRIC:  Normal affect   ASSESSMENT:    1. Preop cardiovascular exam   2. Primary hypertension   3. Hyperlipidemia LDL goal <70   4. Prediabetes   5. Mild persistent asthma without complication   6. Stage 3 chronic kidney disease, unspecified whether stage 3a or 3b CKD (HCC)    PLAN:    In order of problems listed above:  Preoperative clearance: Patient has upcoming left total hip replacement by Dr. [name].  He has no prior history  of CAD, although he does have family history of CAD.  He has no recent chest pain or worsening dyspnea.  He has a  history of mild persistent asthma and a previous asthma exacerbation was in March at which time he received breathing treatment, steroid and antibiotic.  Overall, he seems to be quite stable in the past 2 months.  EKG shows no acute changes.  He is functional ability is mainly limited by hip issue not by cardiac issue.  I discussed his case with DOD Dr. Sallyanne Kuster, he is at acceptable risk to proceed with upcoming hip replacement surgery without further work-up.  I have made a copy of today's cardiac clearance letter and EKG for him to take to his surgeon's office (our medical record staff already left for the day, therefore EKG was unable to be scanned in until later tomorrow).  Hypertension: Blood pressure mildly elevated today, however normally it is quite well controlled  Hyperlipidemia: On Crestor  Prediabetes: Managed by primary care provider.  Mild persistent asthma: Followed by pulmonology service.  CKD stage III: Stable on last blood work yesterday.           Medication Adjustments/Labs and Tests Ordered: Current medicines are reviewed at length with the patient today.  Concerns regarding medicines are outlined above.  No orders of the defined types were placed in this encounter.  No orders of the defined types were placed in this encounter.   Patient Instructions  Medication Instructions:  No change in medication  *If you need a refill on your cardiac medications before your next appointment, please call your pharmacy*   Lab Work:  No lab work   If you have labs (blood work) drawn today and your tests are completely normal, you will receive your results only by: [place] (if you have MyChart) OR A paper copy in the mail If you have any lab test that is abnormal or we need to change your treatment, we will call you to review the results.   Testing/Procedures: No additional testing   Follow-Up: At [facility], you and your health needs are our priority.   As part of our continuing mission to provide you with exceptional heart care, we have created designated Provider Care Teams.  These Care Teams include your primary Cardiologist (physician) and Advanced Practice Providers (APPs -  Physician Assistants and Nurse Practitioners) who all work together to provide you with the care you need, when you need it.  We recommend signing up for the patient portal called "MyChart".  Sign up information is provided on this After Visit Summary.  MyChart is used to connect with patients for Virtual Visits (Telemedicine).  Patients are able to view lab/test results, encounter notes, upcoming appointments, etc.  Non-urgent messages can be sent to your provider as well.   To learn more about what you can do with MyChart, go to [email or web address].    Your next appointment:   1 year(s)  The format for your next appointment:   In Person  Provider:   Quay Burow, MD     Other Instructions  You are cleared for upcoming surgery.   Important Information About Sugar         Hilbert Corrigan, [place]  04/17/2022 5:51 PM    [facility]

## 2022-04-17 NOTE — Telephone Encounter (Signed)
Note, patient no showed for preoperative clearance office visit.  Therefore, I was unable to clear him.

## 2022-04-18 ENCOUNTER — Encounter (HOSPITAL_COMMUNITY): Payer: Self-pay | Admitting: Orthopedic Surgery

## 2022-04-18 ENCOUNTER — Ambulatory Visit (HOSPITAL_BASED_OUTPATIENT_CLINIC_OR_DEPARTMENT_OTHER): Payer: Medicare Other | Admitting: Anesthesiology

## 2022-04-18 ENCOUNTER — Encounter (HOSPITAL_COMMUNITY)
Admission: RE | Disposition: A | Discharge status: Skilled Nursing Facility | Payer: Self-pay | Source: Home / Self Care | Attending: Orthopedic Surgery

## 2022-04-18 ENCOUNTER — Other Ambulatory Visit: Payer: Self-pay

## 2022-04-18 ENCOUNTER — Inpatient Hospital Stay (HOSPITAL_COMMUNITY)
Admission: RE | Admit: 2022-04-18 | Discharge: 2022-04-23 | DRG: 504 | Disposition: A | Discharge status: Skilled Nursing Facility | Payer: Medicare Other | Attending: Orthopedic Surgery | Admitting: Orthopedic Surgery

## 2022-04-18 ENCOUNTER — Ambulatory Visit: Admit: 2022-04-18 | Payer: Medicare Other | Admitting: Orthopedic Surgery

## 2022-04-18 ENCOUNTER — Ambulatory Visit (HOSPITAL_COMMUNITY): Payer: Medicare Other | Admitting: Anesthesiology

## 2022-04-18 DIAGNOSIS — J4520 Mild intermittent asthma, uncomplicated: Secondary | ICD-10-CM | POA: Diagnosis present

## 2022-04-18 DIAGNOSIS — M86171 Other acute osteomyelitis, right ankle and foot: Secondary | ICD-10-CM

## 2022-04-18 DIAGNOSIS — G4733 Obstructive sleep apnea (adult) (pediatric): Secondary | ICD-10-CM | POA: Diagnosis not present

## 2022-04-18 DIAGNOSIS — Z9842 Cataract extraction status, left eye: Secondary | ICD-10-CM

## 2022-04-18 DIAGNOSIS — L03031 Cellulitis of right toe: Secondary | ICD-10-CM | POA: Diagnosis present

## 2022-04-18 DIAGNOSIS — F419 Anxiety disorder, unspecified: Secondary | ICD-10-CM | POA: Diagnosis present

## 2022-04-18 DIAGNOSIS — K219 Gastro-esophageal reflux disease without esophagitis: Secondary | ICD-10-CM | POA: Diagnosis present

## 2022-04-18 DIAGNOSIS — R7303 Prediabetes: Secondary | ICD-10-CM | POA: Diagnosis present

## 2022-04-18 DIAGNOSIS — G9332 Myalgic encephalomyelitis/chronic fatigue syndrome: Secondary | ICD-10-CM | POA: Diagnosis not present

## 2022-04-18 DIAGNOSIS — N1832 Chronic kidney disease, stage 3b: Secondary | ICD-10-CM | POA: Diagnosis present

## 2022-04-18 DIAGNOSIS — Z79899 Other long term (current) drug therapy: Secondary | ICD-10-CM

## 2022-04-18 DIAGNOSIS — Z833 Family history of diabetes mellitus: Secondary | ICD-10-CM

## 2022-04-18 DIAGNOSIS — G4730 Sleep apnea, unspecified: Secondary | ICD-10-CM | POA: Diagnosis present

## 2022-04-18 DIAGNOSIS — N400 Enlarged prostate without lower urinary tract symptoms: Secondary | ICD-10-CM | POA: Diagnosis present

## 2022-04-18 DIAGNOSIS — M1612 Unilateral primary osteoarthritis, left hip: Secondary | ICD-10-CM | POA: Diagnosis present

## 2022-04-18 DIAGNOSIS — M869 Osteomyelitis, unspecified: Secondary | ICD-10-CM | POA: Diagnosis present

## 2022-04-18 DIAGNOSIS — Z96652 Presence of left artificial knee joint: Secondary | ICD-10-CM | POA: Diagnosis present

## 2022-04-18 DIAGNOSIS — K5900 Constipation, unspecified: Secondary | ICD-10-CM | POA: Diagnosis not present

## 2022-04-18 DIAGNOSIS — F418 Other specified anxiety disorders: Secondary | ICD-10-CM | POA: Diagnosis not present

## 2022-04-18 DIAGNOSIS — F331 Major depressive disorder, recurrent, moderate: Secondary | ICD-10-CM | POA: Diagnosis not present

## 2022-04-18 DIAGNOSIS — L97929 Non-pressure chronic ulcer of unspecified part of left lower leg with unspecified severity: Secondary | ICD-10-CM | POA: Diagnosis present

## 2022-04-18 DIAGNOSIS — M868X7 Other osteomyelitis, ankle and foot: Secondary | ICD-10-CM | POA: Diagnosis not present

## 2022-04-18 DIAGNOSIS — L089 Local infection of the skin and subcutaneous tissue, unspecified: Secondary | ICD-10-CM

## 2022-04-18 DIAGNOSIS — J301 Allergic rhinitis due to pollen: Secondary | ICD-10-CM | POA: Diagnosis not present

## 2022-04-18 DIAGNOSIS — R509 Fever, unspecified: Secondary | ICD-10-CM | POA: Diagnosis not present

## 2022-04-18 DIAGNOSIS — M25452 Effusion, left hip: Secondary | ICD-10-CM | POA: Diagnosis not present

## 2022-04-18 DIAGNOSIS — G8929 Other chronic pain: Secondary | ICD-10-CM | POA: Diagnosis not present

## 2022-04-18 DIAGNOSIS — R531 Weakness: Secondary | ICD-10-CM | POA: Diagnosis not present

## 2022-04-18 DIAGNOSIS — M1A9XX1 Chronic gout, unspecified, with tophus (tophi): Secondary | ICD-10-CM | POA: Diagnosis present

## 2022-04-18 DIAGNOSIS — I129 Hypertensive chronic kidney disease with stage 1 through stage 4 chronic kidney disease, or unspecified chronic kidney disease: Secondary | ICD-10-CM | POA: Diagnosis present

## 2022-04-18 DIAGNOSIS — M25552 Pain in left hip: Secondary | ICD-10-CM | POA: Diagnosis not present

## 2022-04-18 DIAGNOSIS — R609 Edema, unspecified: Secondary | ICD-10-CM | POA: Diagnosis not present

## 2022-04-18 DIAGNOSIS — Z8349 Family history of other endocrine, nutritional and metabolic diseases: Secondary | ICD-10-CM | POA: Diagnosis not present

## 2022-04-18 DIAGNOSIS — F32A Depression, unspecified: Secondary | ICD-10-CM | POA: Diagnosis present

## 2022-04-18 DIAGNOSIS — E569 Vitamin deficiency, unspecified: Secondary | ICD-10-CM | POA: Diagnosis not present

## 2022-04-18 DIAGNOSIS — Z7401 Bed confinement status: Secondary | ICD-10-CM | POA: Diagnosis not present

## 2022-04-18 DIAGNOSIS — Z9841 Cataract extraction status, right eye: Secondary | ICD-10-CM

## 2022-04-18 DIAGNOSIS — E785 Hyperlipidemia, unspecified: Secondary | ICD-10-CM | POA: Diagnosis present

## 2022-04-18 DIAGNOSIS — Z7951 Long term (current) use of inhaled steroids: Secondary | ICD-10-CM

## 2022-04-18 DIAGNOSIS — L03119 Cellulitis of unspecified part of limb: Secondary | ICD-10-CM | POA: Diagnosis not present

## 2022-04-18 DIAGNOSIS — Z8249 Family history of ischemic heart disease and other diseases of the circulatory system: Secondary | ICD-10-CM

## 2022-04-18 DIAGNOSIS — Z01818 Encounter for other preprocedural examination: Secondary | ICD-10-CM

## 2022-04-18 DIAGNOSIS — J4530 Mild persistent asthma, uncomplicated: Secondary | ICD-10-CM | POA: Diagnosis not present

## 2022-04-18 DIAGNOSIS — Z538 Procedure and treatment not carried out for other reasons: Secondary | ICD-10-CM | POA: Diagnosis present

## 2022-04-18 DIAGNOSIS — I10 Essential (primary) hypertension: Secondary | ICD-10-CM | POA: Diagnosis not present

## 2022-04-18 DIAGNOSIS — G479 Sleep disorder, unspecified: Secondary | ICD-10-CM | POA: Diagnosis not present

## 2022-04-18 DIAGNOSIS — F411 Generalized anxiety disorder: Secondary | ICD-10-CM | POA: Diagnosis not present

## 2022-04-18 DIAGNOSIS — R600 Localized edema: Secondary | ICD-10-CM | POA: Diagnosis not present

## 2022-04-18 DIAGNOSIS — R058 Other specified cough: Secondary | ICD-10-CM | POA: Diagnosis not present

## 2022-04-18 HISTORY — PX: AMPUTATION TOE: SHX6595

## 2022-04-18 LAB — TYPE AND SCREEN
ABO/RH(D): O POS
Antibody Screen: NEGATIVE

## 2022-04-18 LAB — GLUCOSE, CAPILLARY: Glucose-Capillary: 112 mg/dL — ABNORMAL HIGH (ref 70–99)

## 2022-04-18 SURGERY — ARTHROPLASTY, HIP, TOTAL, ANTERIOR APPROACH
Anesthesia: Monitor Anesthesia Care

## 2022-04-18 SURGERY — AMPUTATION, TOE
Anesthesia: General | Site: Toe | Laterality: Right

## 2022-04-18 MED ORDER — BUPIVACAINE HCL (PF) 0.25 % IJ SOLN
INTRAMUSCULAR | Status: AC
  Filled 2022-04-18: qty 30

## 2022-04-18 MED ORDER — MONTELUKAST SODIUM 10 MG PO TABS
10.0000 mg | ORAL_TABLET | Freq: Every day | ORAL | Status: DC
  Administered 2022-04-18 – 2022-04-22 (×5): 10 mg via ORAL
  Filled 2022-04-18 (×5): qty 1

## 2022-04-18 MED ORDER — PANTOPRAZOLE SODIUM 40 MG PO TBEC
40.0000 mg | DELAYED_RELEASE_TABLET | Freq: Every day | ORAL | Status: DC
  Administered 2022-04-18 – 2022-04-23 (×6): 40 mg via ORAL
  Filled 2022-04-18 (×6): qty 1

## 2022-04-18 MED ORDER — ACETAMINOPHEN 325 MG PO TABS: 325.0000 mg | ORAL_TABLET | ORAL | Status: DC | PRN

## 2022-04-18 MED ORDER — BUPIVACAINE-EPINEPHRINE (PF) 0.25% -1:200000 IJ SOLN
INTRAMUSCULAR | Status: AC
  Filled 2022-04-18: qty 30

## 2022-04-18 MED ORDER — METHOCARBAMOL 1000 MG/10ML IJ SOLN: 500.0000 mg | Freq: Four times a day (QID) | INTRAVENOUS | Status: DC | PRN

## 2022-04-18 MED ORDER — ALBUTEROL SULFATE (2.5 MG/3ML) 0.083% IN NEBU
3.0000 mL | INHALATION_SOLUTION | Freq: Four times a day (QID) | RESPIRATORY_TRACT | Status: DC | PRN
Start: 2022-04-18 — End: 2022-04-23

## 2022-04-18 MED ORDER — HYDROCODONE-ACETAMINOPHEN 5-325 MG PO TABS
1.0000 | ORAL_TABLET | ORAL | Status: DC | PRN
  Administered 2022-04-18 – 2022-04-23 (×12): 2 via ORAL
  Filled 2022-04-18 (×12): qty 2

## 2022-04-18 MED ORDER — 0.9 % SODIUM CHLORIDE (POUR BTL) OPTIME
TOPICAL | Status: DC | PRN
  Administered 2022-04-18: 1000 mL

## 2022-04-18 MED ORDER — ONDANSETRON HCL 4 MG/2ML IJ SOLN: 4.0000 mg | Freq: Four times a day (QID) | INTRAMUSCULAR | Status: DC | PRN

## 2022-04-18 MED ORDER — FENTANYL CITRATE (PF) 100 MCG/2ML IJ SOLN
INTRAMUSCULAR | Status: DC | PRN
Start: 2022-04-18 — End: 2022-04-18
  Administered 2022-04-18: 50 ug via INTRAVENOUS
  Administered 2022-04-18 (×2): 25 ug via INTRAVENOUS

## 2022-04-18 MED ORDER — BUPIVACAINE LIPOSOME 1.3 % IJ SUSP
INTRAMUSCULAR | Status: AC
  Filled 2022-04-18: qty 20

## 2022-04-18 MED ORDER — MIDAZOLAM HCL 5 MG/5ML IJ SOLN
INTRAMUSCULAR | Status: DC | PRN
  Administered 2022-04-18: 1 mg via INTRAVENOUS

## 2022-04-18 MED ORDER — TRANEXAMIC ACID-NACL 1000-0.7 MG/100ML-% IV SOLN
1000.0000 mg | INTRAVENOUS | Status: DC
  Filled 2022-04-18: qty 100

## 2022-04-18 MED ORDER — CEFAZOLIN SODIUM-DEXTROSE 2-4 GM/100ML-% IV SOLN
2.0000 g | INTRAVENOUS | Status: AC
  Administered 2022-04-18: 2 g via INTRAVENOUS
  Filled 2022-04-18: qty 100

## 2022-04-18 MED ORDER — ACETAMINOPHEN 325 MG PO TABS: 325.0000 mg | ORAL_TABLET | Freq: Four times a day (QID) | ORAL | Status: DC | PRN

## 2022-04-18 MED ORDER — BUPIVACAINE HCL 0.25 % IJ SOLN
INTRAMUSCULAR | Status: AC
  Filled 2022-04-18: qty 1

## 2022-04-18 MED ORDER — FENTANYL CITRATE PF 50 MCG/ML IJ SOSY
25.0000 ug | PREFILLED_SYRINGE | INTRAMUSCULAR | Status: DC | PRN
  Administered 2022-04-18: 50 ug via INTRAVENOUS
  Administered 2022-04-18 (×2): 25 ug via INTRAVENOUS

## 2022-04-18 MED ORDER — PROPOFOL 10 MG/ML IV BOLUS
INTRAVENOUS | Status: AC
  Filled 2022-04-18: qty 40

## 2022-04-18 MED ORDER — MOMETASONE FURO-FORMOTEROL FUM 200-5 MCG/ACT IN AERO
2.0000 | INHALATION_SPRAY | Freq: Two times a day (BID) | RESPIRATORY_TRACT | Status: DC
  Administered 2022-04-18 – 2022-04-23 (×10): 2 via RESPIRATORY_TRACT
  Filled 2022-04-18: qty 8.8

## 2022-04-18 MED ORDER — GABAPENTIN 300 MG PO CAPS
600.0000 mg | ORAL_CAPSULE | Freq: Every day | ORAL | Status: DC
  Administered 2022-04-18 – 2022-04-22 (×5): 600 mg via ORAL
  Filled 2022-04-18 (×5): qty 2

## 2022-04-18 MED ORDER — FENTANYL CITRATE (PF) 100 MCG/2ML IJ SOLN
INTRAMUSCULAR | Status: AC
  Filled 2022-04-18: qty 2

## 2022-04-18 MED ORDER — POVIDONE-IODINE 10 % EX SWAB
2.0000 "application " | Freq: Once | CUTANEOUS | Status: AC
  Administered 2022-04-18: 2 via TOPICAL

## 2022-04-18 MED ORDER — CEFTRIAXONE SODIUM 2 G IJ SOLR: 2.0000 g | INTRAMUSCULAR | Status: DC

## 2022-04-18 MED ORDER — DEXAMETHASONE SODIUM PHOSPHATE 10 MG/ML IJ SOLN
INTRAMUSCULAR | Status: DC | PRN
  Administered 2022-04-18: 4 mg via INTRAVENOUS

## 2022-04-18 MED ORDER — OXYCODONE HCL 5 MG PO TABS: 5.0000 mg | ORAL_TABLET | Freq: Once | ORAL | Status: DC | PRN

## 2022-04-18 MED ORDER — ONDANSETRON HCL 4 MG/2ML IJ SOLN: 4.0000 mg | Freq: Once | INTRAMUSCULAR | Status: DC | PRN

## 2022-04-18 MED ORDER — BUPIVACAINE HCL (PF) 0.5 % IJ SOLN
INTRAMUSCULAR | Status: AC
  Filled 2022-04-18: qty 30

## 2022-04-18 MED ORDER — POLYETHYLENE GLYCOL 3350 17 G PO PACK
17.0000 g | PACK | Freq: Every day | ORAL | Status: DC | PRN
  Filled 2022-04-18: qty 1

## 2022-04-18 MED ORDER — BUPIVACAINE LIPOSOME 1.3 % IJ SUSP: 10.0000 mL | Freq: Once | INTRAMUSCULAR | Status: DC

## 2022-04-18 MED ORDER — DOXAZOSIN MESYLATE 8 MG PO TABS
8.0000 mg | ORAL_TABLET | Freq: Every day | ORAL | Status: DC
  Administered 2022-04-18 – 2022-04-22 (×5): 8 mg via ORAL
  Filled 2022-04-18 (×6): qty 1

## 2022-04-18 MED ORDER — DOCUSATE SODIUM 100 MG PO CAPS
100.0000 mg | ORAL_CAPSULE | Freq: Two times a day (BID) | ORAL | Status: DC
  Administered 2022-04-20 – 2022-04-23 (×4): 100 mg via ORAL
  Filled 2022-04-18 (×10): qty 1

## 2022-04-18 MED ORDER — LIDOCAINE HCL (CARDIAC) PF 100 MG/5ML IV SOSY
PREFILLED_SYRINGE | INTRAVENOUS | Status: DC | PRN
  Administered 2022-04-18: 100 mg via INTRAVENOUS

## 2022-04-18 MED ORDER — FENTANYL CITRATE PF 50 MCG/ML IJ SOSY
PREFILLED_SYRINGE | INTRAMUSCULAR | Status: AC
  Filled 2022-04-18: qty 1

## 2022-04-18 MED ORDER — METHOCARBAMOL 500 MG PO TABS
500.0000 mg | ORAL_TABLET | Freq: Four times a day (QID) | ORAL | Status: DC | PRN
  Administered 2022-04-18 – 2022-04-23 (×10): 500 mg via ORAL
  Filled 2022-04-18 (×10): qty 1

## 2022-04-18 MED ORDER — MIDAZOLAM HCL 2 MG/2ML IJ SOLN
INTRAMUSCULAR | Status: AC
  Filled 2022-04-18: qty 2

## 2022-04-18 MED ORDER — CEFAZOLIN SODIUM-DEXTROSE 2-4 GM/100ML-% IV SOLN: 2.0000 g | Freq: Three times a day (TID) | INTRAVENOUS | Status: DC

## 2022-04-18 MED ORDER — VANCOMYCIN HCL 1500 MG/300ML IV SOLN
1500.0000 mg | INTRAVENOUS | Status: DC
Start: 2022-04-18 — End: 2022-04-22
  Administered 2022-04-18 – 2022-04-21 (×3): 1500 mg via INTRAVENOUS
  Filled 2022-04-18 (×3): qty 300

## 2022-04-18 MED ORDER — BUPIVACAINE HCL (PF) 0.25 % IJ SOLN
INTRAMUSCULAR | Status: AC
  Filled 2022-04-18: qty 10

## 2022-04-18 MED ORDER — OXYCODONE HCL 5 MG/5ML PO SOLN: 5.0000 mg | Freq: Once | ORAL | Status: DC | PRN

## 2022-04-18 MED ORDER — BUPIVACAINE LIPOSOME 1.3 % IJ SUSP
INTRAMUSCULAR | Status: AC
  Filled 2022-04-18: qty 10

## 2022-04-18 MED ORDER — MORPHINE SULFATE (PF) 2 MG/ML IV SOLN: 0.5000 mg | INTRAVENOUS | Status: DC | PRN

## 2022-04-18 MED ORDER — LACTATED RINGERS IV SOLN: INTRAVENOUS | Status: DC | PRN

## 2022-04-18 MED ORDER — BUPROPION HCL ER (XL) 300 MG PO TB24
450.0000 mg | ORAL_TABLET | Freq: Every morning | ORAL | Status: DC
  Administered 2022-04-19 – 2022-04-23 (×5): 450 mg via ORAL
  Filled 2022-04-18 (×5): qty 1

## 2022-04-18 MED ORDER — SODIUM CHLORIDE 0.9 % IV SOLN: INTRAVENOUS | Status: DC

## 2022-04-18 MED ORDER — SODIUM CHLORIDE 0.9 % IV SOLN
2.0000 g | INTRAVENOUS | Status: DC
  Administered 2022-04-18 – 2022-04-22 (×5): 2 g via INTRAVENOUS
  Filled 2022-04-18 (×5): qty 20

## 2022-04-18 MED ORDER — ONDANSETRON HCL 4 MG/2ML IJ SOLN
INTRAMUSCULAR | Status: DC | PRN
  Administered 2022-04-18: 4 mg via INTRAVENOUS

## 2022-04-18 MED ORDER — PROPOFOL 500 MG/50ML IV EMUL
INTRAVENOUS | Status: DC | PRN
  Administered 2022-04-18: 200 mg via INTRAVENOUS

## 2022-04-18 MED ORDER — PROPOFOL 1000 MG/100ML IV EMUL
INTRAVENOUS | Status: AC
  Filled 2022-04-18: qty 100

## 2022-04-18 MED ORDER — POTASSIUM CHLORIDE CRYS ER 20 MEQ PO TBCR
20.0000 meq | EXTENDED_RELEASE_TABLET | Freq: Two times a day (BID) | ORAL | Status: DC
Start: 2022-04-18 — End: 2022-04-23
  Administered 2022-04-18 – 2022-04-23 (×10): 20 meq via ORAL
  Filled 2022-04-18 (×10): qty 1

## 2022-04-18 MED ORDER — BISACODYL 5 MG PO TBEC
5.0000 mg | DELAYED_RELEASE_TABLET | Freq: Every day | ORAL | Status: DC | PRN
  Administered 2022-04-23: 5 mg via ORAL
  Filled 2022-04-18: qty 1

## 2022-04-18 MED ORDER — ONDANSETRON HCL 4 MG PO TABS: 4.0000 mg | ORAL_TABLET | Freq: Four times a day (QID) | ORAL | Status: DC | PRN

## 2022-04-18 MED ORDER — ROSUVASTATIN CALCIUM 5 MG PO TABS
5.0000 mg | ORAL_TABLET | Freq: Every morning | ORAL | Status: DC
  Administered 2022-04-19 – 2022-04-23 (×5): 5 mg via ORAL
  Filled 2022-04-18 (×5): qty 1

## 2022-04-18 MED ORDER — MIRTAZAPINE 15 MG PO TABS
15.0000 mg | ORAL_TABLET | Freq: Every day | ORAL | Status: DC
  Administered 2022-04-18 – 2022-04-22 (×5): 15 mg via ORAL
  Filled 2022-04-18 (×5): qty 1

## 2022-04-18 MED ORDER — MEPERIDINE HCL 50 MG/ML IJ SOLN: 6.2500 mg | INTRAMUSCULAR | Status: DC | PRN

## 2022-04-18 MED ORDER — ACETAMINOPHEN 160 MG/5ML PO SOLN: 325.0000 mg | ORAL | Status: DC | PRN

## 2022-04-18 MED ORDER — BUPIVACAINE HCL (PF) 0.25 % IJ SOLN
INTRAMUSCULAR | Status: DC | PRN
  Administered 2022-04-18: 5 mL

## 2022-04-18 SURGICAL SUPPLY — 39 items
APL SKNCLS STERI-STRIP NONHPOA (GAUZE/BANDAGES/DRESSINGS)
BAG COUNTER SPONGE SURGICOUNT (BAG) IMPLANT
BAG SPEC THK2 15X12 ZIP CLS (MISCELLANEOUS)
BAG SPNG CNTER NS LX DISP (BAG)
BAG ZIPLOCK 12X15 (MISCELLANEOUS) IMPLANT
BENZOIN TINCTURE PRP APPL 2/3 (GAUZE/BANDAGES/DRESSINGS) IMPLANT
BLADE SAW SGTL 18X1.27X75 (BLADE) ×3 IMPLANT
BLADE SURG SZ10 CARB STEEL (BLADE) ×6 IMPLANT
COVER PERINEAL POST (MISCELLANEOUS) ×3 IMPLANT
COVER SURGICAL LIGHT HANDLE (MISCELLANEOUS) ×3 IMPLANT
DRAPE FOOT SWITCH (DRAPES) ×3 IMPLANT
DRAPE STERI IOBAN 125X83 (DRAPES) ×3 IMPLANT
DRAPE U-SHAPE 47X51 STRL (DRAPES) ×6 IMPLANT
DRSG AQUACEL AG ADV 3.5X 6 (GAUZE/BANDAGES/DRESSINGS) ×3 IMPLANT
DURAPREP 26ML APPLICATOR (WOUND CARE) ×3 IMPLANT
ELECT BLADE TIP CTD 4 INCH (ELECTRODE) ×3 IMPLANT
ELECT REM PT RETURN 15FT ADLT (MISCELLANEOUS) ×3 IMPLANT
GAUZE XEROFORM 1X8 LF (GAUZE/BANDAGES/DRESSINGS) IMPLANT
GLOVE BIOGEL PI IND STRL 8 (GLOVE) ×4 IMPLANT
GLOVE BIOGEL PI INDICATOR 8 (GLOVE) ×2
GLOVE ECLIPSE 7.5 STRL STRAW (GLOVE) ×6 IMPLANT
GOWN STRL REUS W/ TWL XL LVL3 (GOWN DISPOSABLE) ×4 IMPLANT
GOWN STRL REUS W/TWL XL LVL3 (GOWN DISPOSABLE) ×4
HOLDER FOLEY CATH W/STRAP (MISCELLANEOUS) ×3 IMPLANT
HOOD PEEL AWAY FLYTE STAYCOOL (MISCELLANEOUS) ×6 IMPLANT
KIT TURNOVER KIT A (KITS) IMPLANT
NEEDLE HYPO 22GX1.5 SAFETY (NEEDLE) ×3 IMPLANT
PACK ANTERIOR HIP CUSTOM (KITS) ×3 IMPLANT
PENCIL SMOKE EVACUATOR (MISCELLANEOUS) IMPLANT
SPIKE FLUID TRANSFER (MISCELLANEOUS) ×3 IMPLANT
STAPLER VISISTAT 35W (STAPLE) IMPLANT
STRIP CLOSURE SKIN 1/2X4 (GAUZE/BANDAGES/DRESSINGS) IMPLANT
SUT ETHIBOND NAB CT1 #1 30IN (SUTURE) ×6 IMPLANT
SUT MNCRL AB 3-0 PS2 18 (SUTURE) IMPLANT
SUT VIC AB 0 CT1 36 (SUTURE) ×3 IMPLANT
SUT VIC AB 1 CT1 36 (SUTURE) ×3 IMPLANT
SUT VIC AB 2-0 CT1 27 (SUTURE) ×2
SUT VIC AB 2-0 CT1 TAPERPNT 27 (SUTURE) ×2 IMPLANT
TRAY FOLEY MTR SLVR 16FR STAT (SET/KITS/TRAYS/PACK) ×3 IMPLANT

## 2022-04-18 SURGICAL SUPPLY — 41 items
BAG COUNTER SPONGE SURGICOUNT (BAG) IMPLANT
BAG SPNG CNTER NS LX DISP (BAG)
BNDG CMPR 9X4 STRL LF SNTH (GAUZE/BANDAGES/DRESSINGS)
BNDG ELASTIC 4X5.8 VLCR STR LF (GAUZE/BANDAGES/DRESSINGS) ×1 IMPLANT
BNDG ELASTIC 6X5.8 VLCR STR LF (GAUZE/BANDAGES/DRESSINGS) ×2 IMPLANT
BNDG ESMARK 4X9 LF (GAUZE/BANDAGES/DRESSINGS) IMPLANT
BNDG GAUZE ELAST 4 BULKY (GAUZE/BANDAGES/DRESSINGS) ×1 IMPLANT
CNTNR URN SCR LID CUP LEK RST (MISCELLANEOUS) ×1 IMPLANT
CONT SPEC 4OZ STRL OR WHT (MISCELLANEOUS) ×2
COVER SURGICAL LIGHT HANDLE (MISCELLANEOUS) ×2 IMPLANT
DRAPE U-SHAPE 48X52 POLY STRL (PACKS) ×1 IMPLANT
DRSG TELFA 3X8 NADH (GAUZE/BANDAGES/DRESSINGS) ×2 IMPLANT
DURAPREP 26ML APPLICATOR (WOUND CARE) IMPLANT
ELECT PENCIL ROCKER SW 15FT (MISCELLANEOUS) ×2 IMPLANT
GAUZE SPONGE 4X4 12PLY STRL (GAUZE/BANDAGES/DRESSINGS) ×2 IMPLANT
GAUZE XEROFORM 1X8 LF (GAUZE/BANDAGES/DRESSINGS) ×1 IMPLANT
GLOVE INDICATOR 8.0 STRL GRN (GLOVE) ×2 IMPLANT
GLOVE SURG LX 7.5 STRW (GLOVE) ×1
GLOVE SURG LX STRL 7.5 STRW (GLOVE) ×1 IMPLANT
KIT BASIN OR (CUSTOM PROCEDURE TRAY) ×2 IMPLANT
MANIFOLD NEPTUNE II (INSTRUMENTS) ×2 IMPLANT
MARKER SKIN DUAL TIP RULER LAB (MISCELLANEOUS) ×2 IMPLANT
NDL HYPO 25X1 1.5 SAFETY (NEEDLE) IMPLANT
NEEDLE HYPO 25X1 1.5 SAFETY (NEEDLE) ×2 IMPLANT
NS IRRIG 1000ML POUR BTL (IV SOLUTION) ×2 IMPLANT
PACK ORTHO EXTREMITY (CUSTOM PROCEDURE TRAY) ×2 IMPLANT
PAD DRESSING TELFA 3X8 NADH (GAUZE/BANDAGES/DRESSINGS) IMPLANT
PADDING CAST ABS 4INX4YD NS (CAST SUPPLIES) ×2
PADDING CAST ABS 6INX4YD NS (CAST SUPPLIES) ×1
PADDING CAST ABS COTTON 4X4 ST (CAST SUPPLIES) IMPLANT
PADDING CAST ABS COTTON 6X4 NS (CAST SUPPLIES) IMPLANT
PADDING UNDERCAST 2 STRL (CAST SUPPLIES)
PADDING UNDERCAST 2X4 STRL (CAST SUPPLIES) IMPLANT
SPIKE FLUID TRANSFER (MISCELLANEOUS) ×2 IMPLANT
SUT ETHILON 3 0 PS 1 (SUTURE) ×2 IMPLANT
SYR 20ML LL LF (SYRINGE) IMPLANT
SYR CONTROL 10ML LL (SYRINGE) ×1 IMPLANT
TOWEL OR 17X26 10 PK STRL BLUE (TOWEL DISPOSABLE) ×2 IMPLANT
TOWEL OR NON WOVEN STRL DISP B (DISPOSABLE) ×2 IMPLANT
UNDERPAD 30X36 HEAVY ABSORB (UNDERPADS AND DIAPERS) ×3 IMPLANT
YANKAUER SUCT BULB TIP NO VENT (SUCTIONS) ×1 IMPLANT

## 2022-04-18 NOTE — Addendum Note (Signed)
Addended by: Jacqulynn Cadet on: 04/18/2022 10:14 AM   Modules accepted: Orders

## 2022-04-18 NOTE — Progress Notes (Signed)
Pharmacy Antibiotic Note  [name] is a 81 y.o. male admitted on 04/18/2022 with osteomyelitis, now s/p amputation with some ulcerative changes, concern for cellulitis vs vascular changes. Pharmacy has been consulted for IV vancomycin dosing.  Plan: Vancomycin 1500 mg IV Q 36 hrs (Goal AUC 400-550. Expected AUC: 538.8. SCr used: 1.71). Ancef discontinued by MD Rocephin per MD Monitor clinical progress, renal function, LOT and vancomycin levels as indicated F/U ID plans    Weight: 112.5 kg (248 lb)  Temp (24hrs), Avg:98 F (36.7 C), Min:97.7 F (36.5 C), Max:98.5 F (36.9 C)  Recent Labs  Lab 04/16/22 1036  WBC 5.7  CREATININE 1.71*    Estimated Creatinine Clearance: 42.6 mL/min (A) (by C-G formula based on SCr of 1.71 mg/dL (H)).    No Known Allergies  Antimicrobials this admission: 6/9 Ancef >> 6/9 6/9 Rocephin >>  6/9 Vancomycin >>   Microbiology results:  6/7 Surgical MRSA PCR: negative  Thank you for allowing pharmacy to be a part of this patient's care.  Royetta Asal, PharmD, BCPS Clinical Pharmacist [place] Please utilize Amion for appropriate phone number to reach the unit pharmacist ([place]) 04/18/2022 5:39 PM

## 2022-04-18 NOTE — Anesthesia Preprocedure Evaluation (Signed)
Anesthesia Evaluation  Patient identified by MRN, date of birth, ID band Patient awake    Reviewed: Allergy & Precautions, H&P , NPO status , Patient's Chart, lab work & pertinent test results  Airway Mallampati: II  TM Distance: >3 FB Neck ROM: Full    Dental no notable dental hx. (+) Teeth Intact, Dental Advisory Given   Pulmonary sleep apnea and Continuous Positive Airway Pressure Ventilation ,    Pulmonary exam normal breath sounds clear to auscultation       Cardiovascular hypertension, negative cardio ROS   Rhythm:Regular Rate:Normal     Neuro/Psych PSYCHIATRIC DISORDERS Anxiety Depression  Neuromuscular disease    GI/Hepatic Neg liver ROS, GERD  Medicated and Controlled,  Endo/Other  negative endocrine ROS  Renal/GU Renal disease  negative genitourinary   Musculoskeletal  (+) Arthritis , Osteoarthritis,    Abdominal   Peds  Hematology negative hematology ROS (+)   Anesthesia Other Findings   Reproductive/Obstetrics negative OB ROS                             Anesthesia Physical  Anesthesia Plan  ASA: 3  Anesthesia Plan: General   Post-op Pain Management: Minimal or no pain anticipated   Induction: Intravenous  PONV Risk Score and Plan: 2 and Ondansetron and Dexamethasone  Airway Management Planned: LMA  Additional Equipment: None  Intra-op Plan:   Post-operative Plan: Extubation in OR  Informed Consent: I have reviewed the patients History and Physical, chart, labs and discussed the procedure including the risks, benefits and alternatives for the proposed anesthesia with the patient or authorized representative who has indicated his/her understanding and acceptance.     Dental advisory given  Plan Discussed with: CRNA and Anesthesiologist  Anesthesia Plan Comments: (DISCUSSION: [name] is a 81 y.o. male with a hx of CKD stage III, depression,  hypogonadism, intermittent asthma, hypertension, hyperlipidemia, prediabetes, OSA and GERD.  Cardiac catheterization performed on 06/17/2007 was normal.  He underwent a venous draping by Dr. Donnetta Hutching for venous reflux and swelling.  Nuclear stress test obtained on 02/19/2017 showed EF 45%, mild diffuse hypokinesis, no ischemia was noted, overall considered low risk study.  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  Echocardiogram obtained on 08/10/2019 showed EF normal, grade 1 DD.  Patient was last seen by Dr. Gwenlyn Found on 06/15/2020 at which time he continued to have dyspnea on exertion which was felt to be multifactorial from obesity, diastolic dysfunction and reactive airway disease.  Patient was previously cleared for lumbar fusion surgery in 2021, however he never went for the surgery and he eventually opted to manage his back issue conservatively. Patient presents today for preoperative clearance prior to left total hip replacement by Dr. [name].  Talking with the patient, he denies any recent chest pain or worsening dyspnea.  He has chronic right lower extremity edema after vein stripping, the degree of right lower extremity edema has not changed recently.  He is taking 40 mg daily of Lasix.  Unfortunately, due to significant hip pain, he is unable to accomplish more than 4 METS of activity.  He can barely bear weight and has to walk very slowly with a cane.  He is EKG is unchanged.  Talking with the patient, his functional ability really deteriorated in the past week, prior to the past week, he was able to do fair  amount of activity by himself.  He has not had any recent asthma exacerbation in the past 2 months.  He is lung is clear on physical exam.  Heart rate is quite regular.  I discussed his case with DOD Dr. Sallyanne Kuster, he is at acceptable risk to proceed with upcoming left total hip replacement surgery.  I have printed out a copy of  his cardiac clearance letter and made a copy of today's EKG as well.  EKG 12/02/21: sinus rhythm   CV: Echo 08/10/19:  1. Left ventricular ejection fraction, by visual estimation, is 60 to 65%. The left ventricle has normal function. There is no left ventricular hypertrophy.  2. Left ventricular diastolic Doppler parameters are consistent with impaired relaxation pattern of LV diastolic filling.  3. Global right ventricle has normal systolic function.The right ventricular size is normal. No increase in right ventricular wall thickness.  4. Left atrial size was normal.  5. Right atrial size was normal.  6. The mitral valve is grossly normal. Trace mitral valve regurgitation.  7. The tricuspid valve is grossly normal. Tricuspid valve regurgitation is trivial.  8. The aortic valve is tricuspid Aortic valve regurgitation is mild by color flow Doppler. Mild aortic valve sclerosis without stenosis.  9. The pulmonic valve was grossly normal. Pulmonic valve regurgitation is mild by color flow Doppler.  10. The inferior vena cava is dilated in size with <50% respiratory variability, suggesting right atrial pressure of 15 mmHg.  )        Anesthesia Quick Evaluation

## 2022-04-18 NOTE — Brief Op Note (Signed)
04/18/2022  11:53 AM  PATIENT:  [name]  81 y.o. male  PRE-OPERATIVE DIAGNOSIS:  INFECTION RIGHT GREAT TOE  POST-OPERATIVE DIAGNOSIS:  INFECTION RIGHT GREAT TOE  PROCEDURE:  Procedure(s): AMPUTATION RIGHT GREAT TOE (Right)  SURGEON:  Surgeon(s) and Role:    [name], MD - Primary  PHYSICIAN ASSISTANT:   ASSISTANTS: blair roberts   ANESTHESIA:   general  EBL:  minimal  BLOOD ADMINISTERED:none  DRAINS: none   LOCAL MEDICATIONS USED:  MARCAINE     SPECIMEN:  Source of Specimen:  great toe  DISPOSITION OF SPECIMEN:  PATHOLOGY  COUNTS:  YES  TOURNIQUET:  * No tourniquets in log *  DICTATION: .Other Dictation: Dictation Number [id number]  PLAN OF CARE: Admit to inpatient   PATIENT DISPOSITION:  PACU - hemodynamically stable.   Delay start of Pharmacological VTE agent (>24hrs) due to surgical blood loss or risk of bleeding: yes

## 2022-04-18 NOTE — Progress Notes (Signed)
Orthopedic Tech Progress Note Patient Details:  Doctor [facility] 1941/07/05 [id number]  Ortho Devices Type of Ortho Device: Postop shoe/boot Ortho Device/Splint Location: RLE Ortho Device/Splint Interventions: Application   Post Interventions Patient Tolerated: Well  [name] [name] 04/18/2022, 4:31 PM

## 2022-04-18 NOTE — Evaluation (Signed)
Physical Therapy Evaluation Patient Details Name: [name] MRN: [id number] DOB: 03-25-1941 Today's Date: 04/18/2022  History of Present Illness  Pt is an 81yo male intending to present s/p L-THA, AA but upon pre-op was found to have osteomyelitis of R-great toe which was amputated at the distal phalanx on 04/18/22.  PMH: CKD, BPH, GERD, HTN, HLD, NM disorder, SOB, back surgery, L-TKA 2016.   Clinical Impression  CAROLINE MATTERS is a 81 y.o. male POD 0 s/p amputation of R great toe distal phalanx secondary to osteomyelitis. Pt originally scheduled for L-THA and reports high degree of pain in hip that limits his movements. Patient reports modified independence using SPC for mobility at baseline. Patient is now limited by functional impairments (see PT problem list below) and requires min assist for bed mobility and min guard for transfers, further mobility deferred secondary to hip pain and pt request. Patient instructed in exercise to facilitate ROM and circulation to manage edema. Patient will benefit from continued skilled PT interventions to address impairments and progress towards PLOF. Acute PT will follow to progress mobility and stair training in preparation for safe discharge home.       Recommendations for follow up therapy are one component of a multi-disciplinary discharge planning process, led by the attending physician.  Recommendations may be updated based on patient status, additional functional criteria and insurance authorization.  Follow Up Recommendations Follow physician's recommendations for discharge plan and follow up therapies    Assistance Recommended at Discharge Intermittent Supervision/Assistance  Patient can return home with the following  A little help with walking and/or transfers;A little help with bathing/dressing/bathroom;Assistance with cooking/housework;Assist for transportation;Help with stairs or ramp for entrance    Equipment Recommendations Rolling  walker (2 wheels)  Recommendations for Other Services       Functional Status Assessment Patient has had a recent decline in their functional status and demonstrates the ability to make significant improvements in function in a reasonable and predictable amount of time.     Precautions / Restrictions Precautions Precautions: Fall Required Braces or Orthoses: Other Brace (post-op shoe) Other Brace: post-op shoe RLE Restrictions Weight Bearing Restrictions: No      Mobility  Bed Mobility Overal bed mobility: Needs Assistance Bed Mobility: Supine to Sit, Sit to Supine     Supine to sit: Min assist Sit to supine: Min assist   General bed mobility comments: Min assist to bring BLE off and on bed.    Transfers Overall transfer level: Needs assistance Equipment used: Straight cane Transfers: Sit to/from Stand Sit to Stand: From elevated surface, Min guard           General transfer comment: Pt min guard for sit to stand transfer from elevated surface, no physical assist required.    Ambulation/Gait               General Gait Details: deferred secondary to hip pain  Stairs            Wheelchair Mobility    Modified Rankin (Stroke Patients Only)       Balance Overall balance assessment: Needs assistance Sitting-balance support: Feet supported, Single extremity supported Sitting balance-Leahy Scale: Fair     Standing balance support: Single extremity supported, Reliant on assistive device for balance Standing balance-Leahy Scale: Poor Standing balance comment: Pt stands with trunk flexed and with weight shifted to right side.  Pertinent Vitals/Pain Pain Assessment Pain Assessment: Faces Faces Pain Scale: Hurts whole lot Pain Location: Left hip Pain Descriptors / Indicators: Grimacing, Discomfort, Sore Pain Intervention(s): Limited activity within patient's tolerance, Monitored during session, Repositioned     Home Living Family/patient expects to be discharged to:: Private residence Living Arrangements: Alone   Type of Home: House Home Access: Stairs to enter Entrance Stairs-Rails: Psychiatric nurse of Steps: 5 Alternate Level Stairs-Number of Steps: 15 Home Layout: Two level;Bed/bath upstairs Home Equipment: Cane - single point      Prior Function Prior Level of Function : Independent/Modified Independent             Mobility Comments: Pt uses SPC at all times and limits mobility. ADLs Comments: Ind, but self-limits     Hand Dominance   Dominant Hand: Right    Extremity/Trunk Assessment   Upper Extremity Assessment Upper Extremity Assessment: Overall WFL for tasks assessed    Lower Extremity Assessment Lower Extremity Assessment: RLE deficits/detail;LLE deficits/detail RLE Deficits / Details: Foot in post-op shoe, calf in ace wrap with bandage underneath. Limited testing secondary to active infection. Pt has nearly zero active ank DF/PF ROM (pt chose to demonstrate motion, PT did not ask him to do so). RLE Sensation: WNL LLE Deficits / Details: Pt has limited ROM in all joints of LLE secondary to L hip pain. LLE Sensation: WNL    Cervical / Trunk Assessment Cervical / Trunk Assessment: Normal  Communication   Communication: No difficulties  Cognition Arousal/Alertness: Awake/alert Behavior During Therapy: WFL for tasks assessed/performed Overall Cognitive Status: Within Functional Limits for tasks assessed                                          General Comments      Exercises Total Joint Exercises Ankle Circles/Pumps: AROM, Left, 10 reps, Supine   Assessment/Plan    PT Assessment Patient needs continued PT services  PT Problem List Decreased strength;Decreased range of motion;Decreased activity tolerance;Decreased balance;Decreased mobility;Decreased coordination;Pain       PT Treatment Interventions Gait  training;Stair training;Functional mobility training;Therapeutic activities;Therapeutic exercise;Balance training;Neuromuscular re-education;Patient/family education    PT Goals (Current goals can be found in the Care Plan section)  Acute Rehab PT Goals Patient Stated Goal: To get my left hip replacement PT Goal Formulation: With patient Time For Goal Achievement: 04/25/22 Potential to Achieve Goals: Good    Frequency 7X/week     Co-evaluation               AM-PAC PT "6 Clicks" Mobility  Outcome Measure Help needed turning from your back to your side while in a flat bed without using bedrails?: None Help needed moving from lying on your back to sitting on the side of a flat bed without using bedrails?: A Little Help needed moving to and from a bed to a chair (including a wheelchair)?: A Little Help needed standing up from a chair using your arms (e.g., wheelchair or bedside chair)?: A Little Help needed to walk in hospital room?: A Little Help needed climbing 3-5 steps with a railing? : A Lot 6 Click Score: 18    End of Session Equipment Utilized During Treatment: Gait belt Activity Tolerance: Patient limited by pain Patient left: in bed;with call bell/phone within reach;with bed alarm set;with SCD's reapplied Nurse Communication: Mobility status PT Visit Diagnosis: Difficulty in walking, not elsewhere classified (R26.2);Pain  Pain - Right/Left: Left Pain - part of body: Hip    Time: 7619-5093 PT Time Calculation (min) (ACUTE ONLY): 24 min   Charges:   PT Evaluation $PT Eval Low Complexity: 1 Low          Coolidge Breeze, PT, DPT [facility] Department Office: [phone] Pager: [phone]  Coolidge Breeze 04/18/2022, 6:09 PM

## 2022-04-18 NOTE — Progress Notes (Signed)
The daughter called back and will try to locate her father and have him come in for hsi surgery.

## 2022-04-18 NOTE — H&P (Signed)
A pre op hand p   Chief Complaint: The patient is an 81 year old male who has been followed long-term for degenerative hip arthritis who presented for hip replacement on the left side but has an open known osteomyelitis of the great toe right side.  HPI: [name] is a 81 y.o. male who presents for evaluation of left hip for potential total hip replacement.  The patient reeks of infection and had an Haematologist on his right leg.  Removed Unna boot and identified 3 open ulcerations on the anterior aspect of the leg.  He also has a wound over the distal phalanx that is been identified as chronic osteomyelitis previously.  He recently had an x-ray that again identifies the distal phalanx is having osteomyelitis.  He is having severe hip pain and inability to get around because of severe hip pain.  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   Hyperlipidemia    Joint pain    Neuromuscular disorder (HCC)    Pre-diabetes    Prediabetes    Sleep apnea    uses CPAP nightly   SOB (shortness of breath)    Swallowing difficulty    Swelling of lower extremity    right more than leg leg   Umbilical hernia    Past Surgical History:  Procedure Laterality Date   ABDOMINAL AORTOGRAM W/LOWER EXTREMITY N/A 03/14/2020   Procedure: ABDOMINAL AORTOGRAM W/LOWER EXTREMITY;  Surgeon: Serafina Mitchell, MD;  Location: [facility];  Service: Vascular;   Laterality: N/A;   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   BACK SURGERY  2003   L4, L5   BACK SURGERY     CATARACT EXTRACTION, BILATERAL     ENDOVENOUS ABLATION SAPHENOUS VEIN W/ LASER Right 06-01-2014   EVLA right greater saphenous vein by Curt Jews MD    epidural steroid injections        [facility]   hernia repair  2003   HERNIA REPAIR     hernia inguinal x 2   TOTAL KNEE ARTHROPLASTY Left 03/22/2015   Procedure: TOTAL KNEE ARTHROPLASTY;  Surgeon: Meredith Pel, MD;  Location: [place] [facility];  Service: Orthopedics;  Laterality: Left;   UMBILICAL HERNIA REPAIR N/A 06/27/2016   Procedure: LAPAROSCOPIC ASSISTED REPAIR OF UMBILICAL HERINA WITH MESH;  Surgeon: Johnathan Hausen, MD;  Location: [facility];  Service: General;  Laterality: N/A;   VASCULAR SURGERY  2015   right leg   Social History   Socioeconomic History   Marital status: Single    Spouse name: Not on file   Number of children: Not on file   Years of education: Not on file   Highest education level: Not on file  Occupational History   Occupation: Emergency planning/management officer: RETIRED  Tobacco Use   Smoking status: Never   Smokeless tobacco: Never  Vaping Use   Vaping Use: Never used  Substance and Sexual Activity   Alcohol use: Yes    Comment: rare   Drug use: No   Sexual activity: Yes    Birth control/protection: Condom  Other Topics Concern   Not on file  Social History Narrative   ** Merged History Encounter **       Single. Education: Other.    Social Determinants of Health   Financial Resource Strain: Not on file  Food Insecurity: Not on file  Transportation Needs: Not on file  Physical Activity: Not on file  Stress: Not on file  Social Connections: Not on file   Family History  Problem Relation Age of Onset   Thyroid disease Mother    Heart disease Father    Hypertension Father    Diabetes Maternal Grandfather    Colon cancer Neg Hx    Esophageal cancer Neg  Hx    No Known Allergies Prior to Admission medications   Medication Sig Start Date End Date Taking? Authorizing Provider  albuterol (VENTOLIN HFA) 108 (90 Base) MCG/ACT inhaler Inhale 2 puffs into the lungs every 6 (six) hours as needed for wheezing or shortness of breath. 02/03/22  Yes Cobb, Karie Schwalbe, NP  budesonide-formoterol (SYMBICORT) 160-4.5 MCG/ACT inhaler Inhale 2 puffs into the lungs 2 (two) times daily. 08/21/21  Yes Mannam, Praveen, MD  buPROPion (WELLBUTRIN XL) 150 MG 24 hr tablet Take 450 mg by mouth every morning.   Yes [provider]  CALCIUM PO Take 1 tablet by mouth every morning.   Yes [provider]  doxazosin (CARDURA) 8 MG tablet TAKE 1 TABLET BY MOUTH DAILY  . Take at night Patient taking differently: Take 8 mg by mouth at bedtime. 03/11/16  Yes [name], MD  esomeprazole (NEXIUM) 20 MG capsule Take 20 mg by mouth every morning.   Yes [provider]  fluticasone (FLONASE) 50 MCG/ACT nasal spray Place 1 spray into both nostrils daily. Patient taking differently: Place 1 spray into both nostrils 2 (two) times daily as needed for allergies or rhinitis. 04/05/21  Yes Martyn Ehrich, NP  furosemide (LASIX) 40 MG tablet Take 40 mg by mouth See admin instructions. Take one tablet (40 mg) by mouth every morning, may take an additional tablet (40 mg) as needed for swelling   Yes [provider]  gabapentin (NEURONTIN) 300 MG capsule TAKE 1 TO 2 CAPSULES BY MOUTH AT BEDTIME Patient taking differently: Take 600 mg by mouth at bedtime. 01/15/17  Yes Tereasa Coop, PA-C  glucosamine-chondroitin 500-400 MG tablet Take 1 tablet by mouth 2 (two) times daily.   Yes [provider]  guaiFENesin (MUCINEX) 600 MG 12 hr tablet Take 1 tablet (600 mg total) by mouth 2 (two) times daily as needed. Patient taking differently: Take 600 mg by mouth 2 (two) times daily as needed for cough or to loosen phlegm. 04/05/21  Yes Martyn Ehrich, NP   HYDROcodone-acetaminophen (NORCO) 5-325 MG tablet 1 po q d prn pain Patient taking differently: Take 1 tablet by mouth 2 (two) times daily. 07/06/17  Yes Meredith Pel, MD  methocarbamol (ROBAXIN) 500 MG tablet Take 1 tablet (500 mg total) by mouth every 8 (eight) hours as needed for muscle spasms. Patient taking differently: Take 500 mg by mouth in the morning  and at bedtime. 08/08/21  Yes Loni Beckwith, PA-C  mirtazapine (REMERON) 15 MG tablet Take 15 mg by mouth at bedtime.   Yes [provider]  montelukast (SINGULAIR) 10 MG tablet Take 1 tablet (10 mg total) by mouth at bedtime. 01/24/22  Yes Cobb, Karie Schwalbe, NP  Multiple Vitamin (MULTIVITAMIN WITH MINERALS) TABS tablet Take 1 tablet by mouth every morning.   Yes [provider]  potassium chloride SA (K-DUR,KLOR-CON) 20 MEQ tablet Take 1 tablet (20 mEq total) by mouth 2 (two) times daily. 07/23/16  Yes [name], MD  rosuvastatin (CRESTOR) 5 MG tablet Take 5 mg by mouth every morning. 12/20/18  Yes [provider]  B-D 3CC LUER-LOK SYR 21GX1-1/2 21G X 1-1/2" 3 ML MISC USE TO INJECT DEPOTESTOSTERONE. 07/28/16   [name], MD  Budeson-Glycopyrrol-Formoterol (BREZTRI AEROSPHERE) 160-9-4.8 MCG/ACT AERO Inhale 2 puffs into the lungs in the morning and at bedtime. Patient not taking: Reported on 04/18/2022 04/09/22   Clayton Bibles, NP  meloxicam (MOBIC) 15 MG tablet Take 15 mg by mouth every evening. Patient not taking: Reported on 04/18/2022    [provider]  NEEDLE, DISP, 21 G 21G X 1-1/2" MISC Use to inject depotestosterone. 06/11/15   Harrison Mons, PA  Syringe, Reusable, 3 ML MISC Use to inject depotestosterone. 06/11/15   Harrison Mons, PA  testosterone cypionate (DEPOTESTOSTERONE CYPIONATE) 200 MG/ML injection every Saturday. Patient not taking: Reported on 04/18/2022 08/14/21   [provider]     Positive ROS: None  All other systems have been reviewed and were otherwise  negative with the exception of those mentioned in the HPI and as above.  Physical Exam: Vitals:   04/18/22 0855  BP: (!) 178/85  Pulse: 77  Resp: 18  Temp: 98.5 F (36.9 C)  SpO2: 92%    General: Alert, no acute distress Cardiovascular: No pedal edema Respiratory: No cyanosis, no use of accessory musculature GI: No organomegaly, abdomen is soft and non-tender Skin: No lesions in the area of chief complaint Neurologic: Sensation intact distally Psychiatric: Patient is competent for consent with normal mood and affect Lymphatic: No axillary or cervical lymphadenopathy  MUSCULOSKELETAL: Right leg has open ulcerated areas on the anterior aspect of the leg.  Left great toe has a crusted over ulceration that is not actively draining.  Leg is erythematous and swollen.  Assessment/Plan: LEFT HIP SEVERE DEGENERATIVE JOINT DISEASE. Open crusted wound over the distal phalanx of the right great toe with radiographic and MRI findings consistent with osteomyelitis. Modified plan: The patient had presented for left total hip replacement but at this point I am very much concerned about the possibility of infection postoperatively.  I think we need to address the distal phalanx of his right great toe and we will do that with an IP amputation today.  He will be admitted for IV antibiotics followed by oral antibiotics and continued work on the right lower extremity to get that leg and is good a position as we can for potential of performing a left total hip replacement once he is in a better situation.   The risks benefits and alternatives were discussed with the patient including but not limited to the risks of nonoperative treatment, versus surgical intervention including infection, bleeding, nerve injury, malunion, nonunion, hardware prominence, hardware failure, need for hardware removal, blood clots, cardiopulmonary complications, morbidity, mortality, among others, and they were willing to proceed.   Predicted outcome is good, although there will be at least  a six to nine month expected recovery.  Alta Corning, MD 04/18/2022 10:23 AM

## 2022-04-18 NOTE — Transfer of Care (Signed)
Immediate Anesthesia Transfer of Care Note  Patient: ELION HOCKER  Procedure(s) Performed: AMPUTATION RIGHT GREAT TOE (Right: Toe)  Patient Location: PACU  Anesthesia Type:General  Level of Consciousness: drowsy  Airway & Oxygen Therapy: Patient Spontanous Breathing and Patient connected to face mask oxygen  Post-op Assessment: Report given to RN, Post -op Vital signs reviewed and stable and Patient moving all extremities X 4  Post vital signs: Reviewed and stable  Last Vitals:  Vitals Value Taken Time  BP 172/89   Temp    Pulse 65 04/18/22 1209  Resp    SpO2 99 % 04/18/22 1209  Vitals shown include unvalidated device data.  Last Pain:  Vitals:   04/18/22 0907  TempSrc:   PainSc: 0-No pain         Complications: No notable events documented.

## 2022-04-18 NOTE — Anesthesia Preprocedure Evaluation (Addendum)
Anesthesia Evaluation  Patient identified by MRN, date of birth, ID band Patient awake    Reviewed: Allergy & Precautions, H&P , NPO status , Patient's Chart, lab work & pertinent test results  Airway Mallampati: II  TM Distance: >3 FB Neck ROM: Full    Dental no notable dental hx. (+) Teeth Intact, Dental Advisory Given   Pulmonary sleep apnea and Continuous Positive Airway Pressure Ventilation ,    Pulmonary exam normal breath sounds clear to auscultation       Cardiovascular hypertension, negative cardio ROS   Rhythm:Regular Rate:Normal     Neuro/Psych PSYCHIATRIC DISORDERS Anxiety Depression  Neuromuscular disease    GI/Hepatic Neg liver ROS, GERD  Medicated and Controlled,  Endo/Other  negative endocrine ROS  Renal/GU Renal disease  negative genitourinary   Musculoskeletal  (+) Arthritis , Osteoarthritis,    Abdominal   Peds  Hematology negative hematology ROS (+)   Anesthesia Other Findings   Reproductive/Obstetrics negative OB ROS                             Anesthesia Physical  Anesthesia Plan  ASA: 3  Anesthesia Plan: MAC and Spinal   Post-op Pain Management: Minimal or no pain anticipated   Induction: Intravenous  PONV Risk Score and Plan: 2 and Ondansetron and Dexamethasone  Airway Management Planned: Natural Airway  Additional Equipment: None  Intra-op Plan:   Post-operative Plan: Extubation in OR  Informed Consent: I have reviewed the patients History and Physical, chart, labs and discussed the procedure including the risks, benefits and alternatives for the proposed anesthesia with the patient or authorized representative who has indicated his/her understanding and acceptance.     Dental advisory given  Plan Discussed with: CRNA and Anesthesiologist  Anesthesia Plan Comments: (DISCUSSION: JACQUEES GONGORA is a 81 y.o. male with a hx of CKD stage III,  depression, hypogonadism, intermittent asthma, hypertension, hyperlipidemia, prediabetes, OSA and GERD.  Cardiac catheterization performed on 06/17/2007 was normal.  He underwent a venous draping by Dr. Donnetta Hutching for venous reflux and swelling.  Nuclear stress test obtained on 02/19/2017 showed EF 45%, mild diffuse hypokinesis, no ischemia was noted, overall considered low risk study.  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  Echocardiogram obtained on 08/10/2019 showed EF normal, grade 1 DD.  Patient was last seen by Dr. Gwenlyn Found on 06/15/2020 at which time he continued to have dyspnea on exertion which was felt to be multifactorial from obesity, diastolic dysfunction and reactive airway disease.  Patient was previously cleared for lumbar fusion surgery in 2021, however he never went for the surgery and he eventually opted to manage his back issue conservatively. Patient presents today for preoperative clearance prior to left total hip replacement by Dr. [name].  Talking with the patient, he denies any recent chest pain or worsening dyspnea.  He has chronic right lower extremity edema after vein stripping, the degree of right lower extremity edema has not changed recently.  He is taking 40 mg daily of Lasix.  Unfortunately, due to significant hip pain, he is unable to accomplish more than 4 METS of activity.  He can barely bear weight and has to walk very slowly with a cane.  He is EKG is unchanged.  Talking with the patient, his functional ability really deteriorated in the past week, prior to the past week, he was able  to do fair amount of activity by himself.  He has not had any recent asthma exacerbation in the past 2 months.  He is lung is clear on physical exam.  Heart rate is quite regular.  I discussed his case with DOD Dr. Sallyanne Kuster, he is at acceptable risk to proceed with upcoming left total hip replacement surgery.  I have printed out  a copy of his cardiac clearance letter and made a copy of today's EKG as well.  EKG 12/02/21: sinus rhythm   CV: Echo 08/10/19:  1. Left ventricular ejection fraction, by visual estimation, is 60 to 65%. The left ventricle has normal function. There is no left ventricular hypertrophy.  2. Left ventricular diastolic Doppler parameters are consistent with impaired relaxation pattern of LV diastolic filling.  3. Global right ventricle has normal systolic function.The right ventricular size is normal. No increase in right ventricular wall thickness.  4. Left atrial size was normal.  5. Right atrial size was normal.  6. The mitral valve is grossly normal. Trace mitral valve regurgitation.  7. The tricuspid valve is grossly normal. Tricuspid valve regurgitation is trivial.  8. The aortic valve is tricuspid Aortic valve regurgitation is mild by color flow Doppler. Mild aortic valve sclerosis without stenosis.  9. The pulmonic valve was grossly normal. Pulmonic valve regurgitation is mild by color flow Doppler.  10. The inferior vena cava is dilated in size with <50% respiratory variability, suggesting right atrial pressure of 15 mmHg.  )     Anesthesia Quick Evaluation

## 2022-04-18 NOTE — Op Note (Unsigned)
NAME: [name], [name]. MEDICAL RECORD NO: [id number] ACCOUNT NO: [id number] DATE OF BIRTH: August 16, 1941 FACILITY: [facility]ATION: [facility] PHYSICIAN: Alta Corning, MD  Operative Report   DATE OF PROCEDURE: 04/18/2022  IDENTIFICATION:  The patient is an 81 year old male.  PREOPERATIVE DIAGNOSIS:  Infected right great toe with significant cellulitis.  POSTOPERATIVE DIAGNOSES: Infected right great toe with significant cellulitis.  PRINCIPAL PROCEDURE:  Amputation of the right great toe through the proximal phalanx with primary closure.  SURGEON:  [name], MD  ASSISTANT:  Nehemiah Massed, PA-C, who was present for the entire case and assisted by retraction of tissues and closing to minimize OR time.  BRIEF HISTORY:  The patient is an 81 year old male who presented for left total hip replacement.  He unfortunately had a significant problem with his right lower extremity where he had multiple open wounds over the anterior aspect of the tibia.  He also  had a nidus of bacteria in the great toe.  We canceled his hip replacement, but felt that he probably need an amputation of the great toe before whenever be satisfactory to think about a left total hip replacement and after discussion, we elected to do  that today.  He was brought to the operating room for this procedure.  DESCRIPTION OF PROCEDURE:  The patient was brought to the operating room.  After adequate anesthesia was obtained with a general anesthetic, the patient was placed on the operating table.  The right lower extremity was prepped and draped in the usual  sterile fashion.  Following this, attention was turned to the right great toe where incision was made for amputation of the distal phalanx, subcutaneous tissue down to the level of the distal phalanx, which was removed.  The proximal phalanx then was  nibbled on both the condyles and then also proximally to get to good bleeding bone.  Once that was done, we checked the incision and  the wound looked to be adequate to get the toe covered. At this point, the toe edges were everted and closed with nylon  interrupted sutures  after irrigation of the wound.  Sterile compressive dressing was applied as well as some *** as well as a Telfa and some padding and then a compressive Ace wrap up the leg.  At that point, the patient was taken to recovery room,  where he was noted to be in satisfactory condition.  Estimated blood loss for procedure was minimal.   MUK D: 04/18/2022 11:56:55 am T: 04/18/2022 10:33:00 pm  JOB: [id number]/ [id number]

## 2022-04-18 NOTE — Consult Note (Addendum)
[name] Infectious Disease       Reason for Consult: osteomyelitis    Referring Physician: Dr. Berenice Primas  Principal Problem:   Osteomyelitis of great toe of right foot ([name])    [START ON 04/19/2022] buPROPion  450 mg Oral q morning   docusate sodium  100 mg Oral BID   doxazosin  8 mg Oral QHS   fentaNYL       fentaNYL       gabapentin  600 mg Oral QHS   mirtazapine  15 mg Oral QHS   mometasone-formoterol  2 puff Inhalation BID   montelukast  10 mg Oral QHS   pantoprazole  40 mg Oral Daily   potassium chloride SA  20 mEq Oral BID   [START ON 04/19/2022] rosuvastatin  5 mg Oral q morning    Recommendations: Vancomycin + ceftriaxone I will stop the cefazolin since it is redundant At discharge, I recommend oral doxycycline 100 mg twice a day for 10 days and cefadroxil 1000 mg twice a day, also 10 days I will arrange follow up for 04/30/22 at 2:15  Assessment: Osteomyelitis now s/p amputation with some ulcerative changes, concern for cellulitis vs vascular changes.   Picture from his phone reviewed of the mid shin area.  Followed by wound care for this.  Will treat as above and then can likely proceed with THA per the timing by Dr. Berenice Primas now that the toe is removed.    Dr. Baxter Flattery available if needed over the weekend, otherwise I will see him in the office after discharge.    Antibiotics: cefazolin  HPI: [name] is a 81 y.o. male with a history of diabetes and degenerative hip arthritis who had come in for a left total hip arthroplasty.  On presentation, he was noted to have concerns for a right cellulitis and open ulcerations and right great toe osteomyelitis on previous MRI.  THA was changed to amputation of the osteomyelitis and now post op.  Plan for THA at a later date. He is followed by wound care and recently established with [name].     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   Hyperlipidemia    Joint pain    Neuromuscular disorder (HCC)    Pre-diabetes    Prediabetes    Sleep apnea    uses CPAP nightly   SOB (shortness of breath)    Swallowing difficulty    Swelling of lower extremity    right more than leg leg   Umbilical hernia     Social History   Tobacco Use   Smoking status: Never  Smokeless tobacco: Never  Vaping Use   Vaping Use: Never used  Substance Use Topics   Alcohol use: Yes    Comment: rare   Drug use: No    Family History  Problem Relation Age of Onset   Thyroid disease Mother    Heart disease Father    Hypertension Father    Diabetes Maternal Grandfather    Colon cancer Neg Hx    Esophageal cancer Neg Hx     No Known Allergies  Physical Exam: Constitutional: in no apparent distress  Vitals:   04/18/22 1500 04/18/22 1601  BP: 134/72 (!) 157/68  Pulse: (!) 52 (!) 51  Resp: 15 16  Temp:  98 F (36.7 C)  SpO2: 94% 97%   EYES: anicteric Respiratory: normal respiratory effort Musculoskeletal: right leg wrapped   Lab Results  Component Value Date   WBC 5.7 04/16/2022   HGB 14.1 04/16/2022   HCT 42.7 04/16/2022   MCV 95.3 04/16/2022   PLT 162 04/16/2022    Lab Results  Component Value Date   CREATININE 1.71 (H) 04/16/2022   BUN 27 (H) 04/16/2022   NA 139 04/16/2022   K 4.0 04/16/2022   CL 105  04/16/2022   CO2 26 04/16/2022    Lab Results  Component Value Date   ALT 35 12/02/2021   AST 47 (H) 12/02/2021   ALKPHOS 103 12/02/2021     Microbiology: Recent Results (from the past 240 hour(s))  Surgical pcr screen     Status: None   Collection Time: 04/16/22 10:36 AM   Specimen: Nasal Mucosa; Nasal Swab  Result Value Ref Range Status   MRSA, PCR NEGATIVE NEGATIVE Final   Staphylococcus aureus NEGATIVE NEGATIVE Final    Comment: (NOTE) The Xpert SA Assay (FDA approved for NASAL specimens in patients 4 years of age and older), is one component of a comprehensive surveillance program. It is not intended to diagnose infection nor to guide or monitor treatment. Performed at [facility], [place] [address], [place], [place] [address]     Helene Bernstein W German Manke, MD [email or web address] 04/18/2022, 4:24 PM

## 2022-04-18 NOTE — Anesthesia Postprocedure Evaluation (Signed)
Anesthesia Post Note  Patient: MARCELLES CLINARD  Procedure(s) Performed: AMPUTATION RIGHT GREAT TOE (Right: Toe)     Patient location during evaluation: PACU Anesthesia Type: General Level of consciousness: awake and alert Pain management: pain level controlled Vital Signs Assessment: post-procedure vital signs reviewed and stable Respiratory status: spontaneous breathing, nonlabored ventilation, respiratory function stable and patient connected to nasal cannula oxygen Cardiovascular status: blood pressure returned to baseline and stable Postop Assessment: no apparent nausea or vomiting Anesthetic complications: no   No notable events documented.  Last Vitals:  Vitals:   04/18/22 1300 04/18/22 1315  BP: (!) 162/70 (!) 166/74  Pulse: (!) 57 (!) 58  Resp: 15 19  Temp:    SpO2: 98% 99%    Last Pain:  Vitals:   04/18/22 1315  TempSrc:   PainSc: Asleep                 Naviyah Schaffert

## 2022-04-19 ENCOUNTER — Encounter (HOSPITAL_COMMUNITY): Payer: Self-pay | Admitting: Orthopedic Surgery

## 2022-04-19 ENCOUNTER — Other Ambulatory Visit: Payer: Self-pay

## 2022-04-19 LAB — CBC
HCT: 37.6 % — ABNORMAL LOW (ref 39.0–52.0)
Hemoglobin: 12.4 g/dL — ABNORMAL LOW (ref 13.0–17.0)
MCH: 31.9 pg (ref 26.0–34.0)
MCHC: 33 g/dL (ref 30.0–36.0)
MCV: 96.7 fL (ref 80.0–100.0)
Platelets: 165 10*3/uL (ref 150–400)
RBC: 3.89 MIL/uL — ABNORMAL LOW (ref 4.22–5.81)
RDW: 13.5 % (ref 11.5–15.5)
WBC: 7.3 10*3/uL (ref 4.0–10.5)
nRBC: 0 % (ref 0.0–0.2)

## 2022-04-19 LAB — BASIC METABOLIC PANEL
Anion gap: 8 (ref 5–15)
BUN: 30 mg/dL — ABNORMAL HIGH (ref 8–23)
CO2: 27 mmol/L (ref 22–32)
Calcium: 8.8 mg/dL — ABNORMAL LOW (ref 8.9–10.3)
Chloride: 107 mmol/L (ref 98–111)
Creatinine, Ser: 1.72 mg/dL — ABNORMAL HIGH (ref 0.61–1.24)
GFR, Estimated: 40 mL/min — ABNORMAL LOW (ref >60)
Glucose, Bld: 140 mg/dL — ABNORMAL HIGH (ref 70–99)
Potassium: 3.8 mmol/L (ref 3.5–5.1)
Sodium: 142 mmol/L (ref 135–145)

## 2022-04-19 MED ORDER — CALCIUM CARBONATE ANTACID 500 MG PO CHEW: 400.0000 mg | CHEWABLE_TABLET | Freq: Three times a day (TID) | ORAL | Status: DC

## 2022-04-19 MED ORDER — ALUM & MAG HYDROXIDE-SIMETH 200-200-20 MG/5ML PO SUSP
30.0000 mL | ORAL | Status: DC | PRN
Start: 2022-04-19 — End: 2022-04-23
  Administered 2022-04-19: 30 mL via ORAL
  Filled 2022-04-19: qty 30

## 2022-04-19 MED ORDER — FLUTICASONE PROPIONATE 50 MCG/ACT NA SUSP
2.0000 | Freq: Every day | NASAL | Status: DC
  Administered 2022-04-19 – 2022-04-22 (×4): 2 via NASAL
  Filled 2022-04-19: qty 16

## 2022-04-19 MED ORDER — PHENOL 1.4 % MT LIQD
1.0000 | OROMUCOSAL | Status: DC | PRN
Start: 2022-04-19 — End: 2022-04-23
  Administered 2022-04-19: 1 via OROMUCOSAL
  Filled 2022-04-19: qty 177

## 2022-04-19 MED ORDER — SODIUM CHLORIDE 0.9 % IV SOLN: INTRAVENOUS | Status: DC | PRN

## 2022-04-19 MED ORDER — FUROSEMIDE 20 MG PO TABS
20.0000 mg | ORAL_TABLET | Freq: Two times a day (BID) | ORAL | Status: DC | PRN
  Administered 2022-04-19 – 2022-04-20 (×3): 20 mg via ORAL
  Filled 2022-04-19 (×3): qty 1

## 2022-04-19 MED ORDER — CALCIUM CARBONATE ANTACID 500 MG PO CHEW
1.0000 | CHEWABLE_TABLET | Freq: Three times a day (TID) | ORAL | Status: DC
Start: 2022-04-19 — End: 2022-04-19

## 2022-04-19 MED ORDER — CALCIUM CARBONATE ANTACID 500 MG PO CHEW
1.0000 | CHEWABLE_TABLET | Freq: Three times a day (TID) | ORAL | Status: DC | PRN
  Administered 2022-04-19 – 2022-04-21 (×3): 400 mg via ORAL
  Administered 2022-04-22: 200 mg via ORAL
  Filled 2022-04-19 (×2): qty 2
  Filled 2022-04-19: qty 1
  Filled 2022-04-19: qty 2

## 2022-04-19 NOTE — Progress Notes (Signed)
Physical Therapy Treatment Patient Details Name: [name] MRN: [id number] DOB: [birth year] Today's Date: 04/19/2022   History of Present Illness Pt is an 81 yo male presenting for L-THA, AA but was found to have osteomyelitis of R-great toe which was amputated at the distal phalanx on 04/18/22.   PMH: CKD, BPH, GERD, HTN, HLD, NM disorder, SOB, back surgery, L-TKA 2016.    PT Comments    Pt reports hx of right foot drop from previous back issues however tried an AFO and states this did not work for him.  Pt instead compensates with increased hip/knee flexion for foot clearance.  Pt educated on use of RW however prefers using his cane.  Pt encouraged to use RW especially to allow for right foot healing as well as tolerate left hip pain until THA can be performed.  Pt reports he is in the process of moving and does not have "space" for a RW.  Pt also with history of ablation of saphenous vein and had right UNNA boot prior to admission however poor knowledge of any venous disease and not elevating his LEs at rest at home.   Recommendations for follow up therapy are one component of a multi-disciplinary discharge planning process, led by the attending physician.  Recommendations may be updated based on patient status, additional functional criteria and insurance authorization.  Follow Up Recommendations  Follow physician's recommendations for discharge plan and follow up therapies     Assistance Recommended at Discharge Intermittent Supervision/Assistance  Patient can return home with the following A little help with walking and/or transfers;A little help with bathing/dressing/bathroom;Assistance with cooking/housework;Assist for transportation;Help with stairs or ramp for entrance   Equipment Recommendations  Rolling walker (2 wheels)    Recommendations for Other Services       Precautions / Restrictions Precautions Precautions: Fall Other Brace: post-op shoe R LE, also prefers L post  op shoe for height Restrictions Weight Bearing Restrictions: No RLE Weight Bearing: Weight bearing as tolerated Other Position/Activity Restrictions: with post op shoe     Mobility  Bed Mobility Overal bed mobility: Needs Assistance Bed Mobility: Supine to Sit     Supine to sit: Supervision, HOB elevated     General bed mobility comments: required elevated HOB and use of bed rail    Transfers Overall transfer level: Needs assistance Equipment used: Rolling walker (2 wheels) Transfers: Sit to/from Stand Sit to Stand: From elevated surface, Min guard           General transfer comment: verbal cues for hand placement    Ambulation/Gait Ambulation/Gait assistance: Min guard Gait Distance (Feet): 40 Feet Assistive device: Rolling walker (2 wheels) Gait Pattern/deviations: Step-to pattern, Decreased stance time - left       General Gait Details: verbal cues for safe use of RW, pt reports increased use of UE through RW to prevent hip pain and limit weight bearing on surgical foot however also wanted to use cane; pt ambulated approx 5 feet with cane however reaching for doorway and wall; pt reports this is how is usually ambulates at home; pt educated on safety and recommendation of use for RW upon d/c   Stairs             Wheelchair Mobility    Modified Rankin (Stroke Patients Only)       Balance  Cognition Arousal/Alertness: Awake/alert Behavior During Therapy: WFL for tasks assessed/performed Overall Cognitive Status: Within Functional Limits for tasks assessed                                 General Comments: very loquacious        Exercises      General Comments        Pertinent Vitals/Pain Pain Assessment Pain Assessment: Faces Faces Pain Scale: Hurts even more Pain Location: Left hip Pain Descriptors / Indicators: Grimacing, Discomfort, Sore Pain  Intervention(s): Repositioned, Monitored during session    Home Living                          Prior Function            PT Goals (current goals can now be found in the care plan section) Progress towards PT goals: Progressing toward goals    Frequency    7X/week      PT Plan Current plan remains appropriate    Co-evaluation              AM-PAC PT "6 Clicks" Mobility   Outcome Measure  Help needed turning from your back to your side while in a flat bed without using bedrails?: None Help needed moving from lying on your back to sitting on the side of a flat bed without using bedrails?: A Little Help needed moving to and from a bed to a chair (including a wheelchair)?: A Little Help needed standing up from a chair using your arms (e.g., wheelchair or bedside chair)?: A Little Help needed to walk in hospital room?: A Little Help needed climbing 3-5 steps with a railing? : A Lot 6 Click Score: 18    End of Session Equipment Utilized During Treatment: Gait belt Activity Tolerance: Patient tolerated treatment well Patient left: in chair;with call bell/phone within reach;with chair alarm set Nurse Communication: Mobility status PT Visit Diagnosis: Difficulty in walking, not elsewhere classified (R26.2);Pain Pain - Right/Left: Left Pain - part of body: Hip     Time: 2482-5003 PT Time Calculation (min) (ACUTE ONLY): 40 min  Charges:  $Gait Training: 23-37 mins                    Jannette Spanner PT, DPT Acute Rehabilitation Services Pager: [phone] Office: [place] 04/19/2022, 11:55 AM

## 2022-04-19 NOTE — Progress Notes (Signed)
Subjective: 1 Day Post-Op Procedure(s) (LRB): AMPUTATION RIGHT GREAT TOE (Right) Patient reports pain as mild.    Objective: Vital signs in last 24 hours: Temp:  [97.7 F (36.5 C)-98.8 F (37.1 C)] 98.1 F (36.7 C) (06/10 0349) Pulse Rate:  [51-77] 55 (06/10 0349) Resp:  [12-22] 17 (06/10 0349) BP: (122-178)/(60-89) 134/63 (06/10 0349) SpO2:  [91 %-100 %] 100 % (06/10 0349) Weight:  [112.5 kg] 112.5 kg (06/09 1855)  Intake/Output from previous day: 06/09 0701 - 06/10 0700 In: 2494.8 [P.O.:720; I.V.:1274.8; IV Piggyback:500] Out: 1500 [Urine:1500] Intake/Output this shift: No intake/output data recorded.  Recent Labs    04/16/22 1036 04/19/22 0343  HGB 14.1 12.4*   Recent Labs    04/16/22 1036 04/19/22 0343  WBC 5.7 7.3  RBC 4.48 3.89*  HCT 42.7 37.6*  PLT 162 165   Recent Labs    04/16/22 1036 04/19/22 0343  NA 139 142  K 4.0 3.8  CL 105 107  CO2 26 27  BUN 27* 30*  CREATININE 1.71* 1.72*  GLUCOSE 103* 140*  CALCIUM 9.1 8.8*   No results for input(s): "LABPT", "INR" in the last 72 hours.  Neurologically intact ABD soft Neurovascular intact Sensation intact distally Patient in a long-leg compressive dressing.   Assessment/Plan: 1 Day Post-Op Procedure(s) (LRB): AMPUTATION RIGHT GREAT TOE (Right) We will continue IV antibiotics per infectious disease recommendations and certainly appreciate their recommendations. Up with therapy as we need to prove that he is going to be capable of using the right leg to support him through a left total hip replacement which we will try to do as soon as we get this right leg under control in terms of cellulitis, venous stasis ulceration, and getting the toe to heal      Alta Corning 04/19/2022, 7:46 AM

## 2022-04-19 NOTE — Plan of Care (Signed)
  Problem: Education: Goal: Knowledge of General Education information will improve Description Including pain rating scale, medication(s)/side effects and non-pharmacologic comfort measures Outcome: Progressing   Problem: Health Behavior/Discharge Planning: Goal: Ability to manage health-related needs will improve Outcome: Progressing   

## 2022-04-20 LAB — CBC WITH DIFFERENTIAL/PLATELET
Abs Immature Granulocytes: 0.02 10*3/uL (ref 0.00–0.07)
Basophils Absolute: 0 10*3/uL (ref 0.0–0.1)
Basophils Relative: 1 %
Eosinophils Absolute: 0.2 10*3/uL (ref 0.0–0.5)
Eosinophils Relative: 3 %
HCT: 43.3 % (ref 39.0–52.0)
Hemoglobin: 14.4 g/dL (ref 13.0–17.0)
Immature Granulocytes: 0 %
Lymphocytes Relative: 13 %
Lymphs Abs: 0.8 10*3/uL (ref 0.7–4.0)
MCH: 31.6 pg (ref 26.0–34.0)
MCHC: 33.3 g/dL (ref 30.0–36.0)
MCV: 95 fL (ref 80.0–100.0)
Monocytes Absolute: 0.4 10*3/uL (ref 0.1–1.0)
Monocytes Relative: 7 %
Neutro Abs: 4.7 10*3/uL (ref 1.7–7.7)
Neutrophils Relative %: 76 %
Platelets: 201 10*3/uL (ref 150–400)
RBC: 4.56 MIL/uL (ref 4.22–5.81)
RDW: 13.5 % (ref 11.5–15.5)
WBC: 6.1 10*3/uL (ref 4.0–10.5)
nRBC: 0 % (ref 0.0–0.2)

## 2022-04-20 NOTE — Plan of Care (Signed)

## 2022-04-20 NOTE — Progress Notes (Signed)
Pt had refused lab x3. Pt now agreeable to draw blood, phlebotomist made aware.

## 2022-04-20 NOTE — Progress Notes (Signed)
Physical Therapy Treatment Patient Details Name: [name] MRN: [id number] DOB: 1940-11-25 Today's Date: 04/20/2022   History of Present Illness Pt is an 81 yo male presenting for L-THA, AA but was found to have osteomyelitis of R-great toe which was amputated at the distal phalanx on 04/18/22.   PMH: CKD, BPH, GERD, HTN, HLD, NM disorder, SOB, back surgery, L-TKA 2016.    PT Comments    Pt very cooperative and progressing with mobility with noted marked improvement in activity tolerance.  Pt up to hall to ambulate 400' with increased time and RW and additional 12' with cane.  Pt states hopes to have THR by the end of the week??   Recommendations for follow up therapy are one component of a multi-disciplinary discharge planning process, led by the attending physician.  Recommendations may be updated based on patient status, additional functional criteria and insurance authorization.  Follow Up Recommendations  Follow physician's recommendations for discharge plan and follow up therapies     Assistance Recommended at Discharge Intermittent Supervision/Assistance  Patient can return home with the following A little help with walking and/or transfers;A little help with bathing/dressing/bathroom;Assistance with cooking/housework;Assist for transportation;Help with stairs or ramp for entrance   Equipment Recommendations  Rolling walker (2 wheels)    Recommendations for Other Services       Precautions / Restrictions Precautions Precautions: Fall Required Braces or Orthoses: Other Brace Other Brace: post-op shoe R LE, also prefers L post op shoe for height Restrictions Weight Bearing Restrictions: Yes RLE Weight Bearing: Weight bearing as tolerated Other Position/Activity Restrictions: with post op shoe     Mobility  Bed Mobility Overal bed mobility: Needs Assistance Bed Mobility: Supine to Sit     Supine to sit: Supervision, HOB elevated     General bed mobility comments:  required elevated HOB and use of bed rail    Transfers Overall transfer level: Needs assistance Equipment used: Rolling walker (2 wheels) Transfers: Sit to/from Stand Sit to Stand: From elevated surface, Min guard           General transfer comment: verbal cues for hand placement    Ambulation/Gait Ambulation/Gait assistance: Min guard Gait Distance (Feet): 400 Feet Assistive device: Rolling walker (2 wheels), Straight cane Gait Pattern/deviations: Step-to pattern, Decreased stance time - left, Wide base of support, Shuffle, Trunk flexed Gait velocity: decr     General Gait Details: cues for posture and position from RW and with noted wide BOS and foot drop on R.  Pt requested to try with cane and able to ambulate 12' with cane and min guard - no overt LOB but appearing much more precarious with movement   Stairs             Wheelchair Mobility    Modified Rankin (Stroke Patients Only)       Balance Overall balance assessment: Needs assistance Sitting-balance support: Feet supported, Single extremity supported Sitting balance-Leahy Scale: Good     Standing balance support: No upper extremity supported Standing balance-Leahy Scale: Fair                              Cognition Arousal/Alertness: Awake/alert Behavior During Therapy: WFL for tasks assessed/performed Overall Cognitive Status: Within Functional Limits for tasks assessed  General Comments: very loquacious        Exercises      General Comments        Pertinent Vitals/Pain Pain Assessment Pain Assessment: 0-10 Pain Score: 7  Pain Location: 7/10 R shin pain, 2/10 R toe pain, 4/10 L hip pain Pain Descriptors / Indicators: Grimacing, Discomfort, Sore Pain Intervention(s): Limited activity within patient's tolerance, Monitored during session, Premedicated before session    Home Living                          Prior  Function            PT Goals (current goals can now be found in the care plan section) Acute Rehab PT Goals Patient Stated Goal: To get my left hip replacement PT Goal Formulation: With patient Time For Goal Achievement: 04/25/22 Potential to Achieve Goals: Good Progress towards PT goals: Progressing toward goals    Frequency    7X/week      PT Plan Current plan remains appropriate    Co-evaluation              AM-PAC PT "6 Clicks" Mobility   Outcome Measure  Help needed turning from your back to your side while in a flat bed without using bedrails?: None Help needed moving from lying on your back to sitting on the side of a flat bed without using bedrails?: A Little Help needed moving to and from a bed to a chair (including a wheelchair)?: A Little Help needed standing up from a chair using your arms (e.g., wheelchair or bedside chair)?: A Little Help needed to walk in hospital room?: A Little Help needed climbing 3-5 steps with a railing? : A Lot 6 Click Score: 18    End of Session Equipment Utilized During Treatment: Gait belt Activity Tolerance: Patient tolerated treatment well Patient left: in chair;with call bell/phone within reach;with chair alarm set Nurse Communication: Mobility status PT Visit Diagnosis: Difficulty in walking, not elsewhere classified (R26.2);Pain Pain - Right/Left: Left Pain - part of body: Hip     Time: 9937-1696 PT Time Calculation (min) (ACUTE ONLY): 46 min  Charges:  $Gait Training: 23-37 mins $Therapeutic Activity: 8-22 mins                     [place] Pager [phone] Office [phone]  Phill Steck 04/20/2022, 1:16 PM

## 2022-04-20 NOTE — Final Progress Note (Signed)
    Patient doing well with controlled pain, has been up with therapy yesterday. Awaiting blood draw today as refused 3am and 7am draws as he was "sleeping." Pt discussed he believes he is supposed to see wound care next door this week to ensure safe to proceed with surgery. Dr Berenice Primas to re-eval on Monday to discuss future of L THR    Physical Exam: Vitals:   04/20/22 0528 04/20/22 0747  BP: (!) 152/61   Pulse: (!) 58   Resp: 14   Temp: 97.6 F (36.4 C)   SpO2: 96% 98%    Dressing in place, CDI, lower extremities elevated, pt resting comfortably in bed, NVI  POD #2 s/p R great toe amputation for osteomyelitis, L THR pending by Dr Berenice Primas  - up with PT/OT, encourage ambulation to ensure RLE able to support pending L THR - Norco for pain, Robaxin for muscle spasms - lnfectious Disease Plan, appreciate their guidance   Vancomycin + ceftriaxone At D/C, PO doxycycline 100 mg BID x10 days and cefadroxil 1000 mg BID x10 days follow up for 04/30/22 at 2:15

## 2022-04-21 ENCOUNTER — Inpatient Hospital Stay (HOSPITAL_COMMUNITY): Payer: Medicare Other

## 2022-04-21 ENCOUNTER — Other Ambulatory Visit: Payer: Self-pay | Admitting: Orthopedic Surgery

## 2022-04-21 MED ORDER — CALCIUM CARBONATE ANTACID 500 MG PO CHEW
1.0000 | CHEWABLE_TABLET | Freq: Every day | ORAL | Status: DC
  Administered 2022-04-22 – 2022-04-23 (×2): 200 mg via ORAL
  Filled 2022-04-21 (×2): qty 1

## 2022-04-21 MED ORDER — FUROSEMIDE 40 MG PO TABS
40.0000 mg | ORAL_TABLET | Freq: Every day | ORAL | Status: DC | PRN
Start: 2022-04-21 — End: 2022-04-23

## 2022-04-21 MED ORDER — FUROSEMIDE 40 MG PO TABS
40.0000 mg | ORAL_TABLET | Freq: Every day | ORAL | Status: DC
  Administered 2022-04-21 – 2022-04-23 (×3): 40 mg via ORAL
  Filled 2022-04-21 (×3): qty 1

## 2022-04-21 NOTE — Progress Notes (Signed)
Pharmacy Antibiotic Note  [name] is a 81 y.o. male admitted on 04/18/2022 with osteomyelitis, now s/p amputation with some ulcerative changes, concern for cellulitis vs vascular changes. Pharmacy has been consulted for IV vancomycin dosing.  Day 4 Vanc/Rocephin Scr 1.72 on 6/10, not checked 6/11 WBC WNL Afebrile No cultures  Plan: Continue vancomycin 1500 mg IV Q 36 hrs for now(Goal AUC 400-550. Expected AUC: 538.8. SCr used: 1.71). Rocephin per MD Monitor clinical progress, renal function, LOT and vancomycin levels as indicated F/U ID plans - doxy/cefadroxil at discharge x 10 days    Height: 5' 9.02" (175.3 cm) Weight: 112.5 kg (248 lb 0.3 oz) IBW/kg (Calculated) : 70.74  Temp (24hrs), Avg:97.8 F (36.6 C), Min:97.7 F (36.5 C), Max:98 F (36.7 C)  Recent Labs  Lab 04/16/22 1036 04/19/22 0343 04/20/22 1310  WBC 5.7 7.3 6.1  CREATININE 1.71* 1.72*  --      Estimated Creatinine Clearance: 42.3 mL/min (A) (by C-G formula based on SCr of 1.72 mg/dL (H)).    No Known Allergies  Antimicrobials this admission: 6/9 Ancef >> 6/9 6/9 Rocephin >>  6/9 Vancomycin >>   Microbiology results:  6/7 Surgical MRSA PCR: negative  Thank you for allowing pharmacy to be a part of this patient's care.  Adrian Saran, PharmD, BCPS Secure Chat if ?s 04/21/2022 7:29 AM

## 2022-04-21 NOTE — Progress Notes (Signed)
Subjective: 3 Days Post-Op Procedure(s) (LRB): AMPUTATION RIGHT GREAT TOE (Right) Patient reports pain as mild.    Objective: Vital signs in last 24 hours: Temp:  [97.7 F (36.5 C)-98 F (36.7 C)] 97.9 F (36.6 C) (06/12 1343) Pulse Rate:  [54-94] 94 (06/12 1343) Resp:  [17-18] 18 (06/12 1343) BP: (150-158)/(73-99) 150/99 (06/12 1343) SpO2:  [94 %-96 %] 96 % (06/12 1343)  Intake/Output from previous day: 06/11 0701 - 06/12 0700 In: 1188.9 [P.O.:880; IV Piggyback:308.9] Out: 3350 [Urine:3350] Intake/Output this shift: Total I/O In: 720 [P.O.:720] Out: 500 [Urine:500]  Recent Labs    04/19/22 0343 04/20/22 1310  HGB 12.4* 14.4   Recent Labs    04/19/22 0343 04/20/22 1310  WBC 7.3 6.1  RBC 3.89* 4.56  HCT 37.6* 43.3  PLT 165 201   Recent Labs    04/19/22 0343  NA 142  K 3.8  CL 107  CO2 27  BUN 30*  CREATININE 1.72*  GLUCOSE 140*  CALCIUM 8.8*   No results for input(s): "LABPT", "INR" in the last 72 hours.  Neurologically intact ABD soft Neurovascular intact Sensation intact distally Intact pulses distally Right leg has markedly improved cellulitis.  The swelling is dramatically improved.  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   Assessment/Plan: 3 Days Post-Op Procedure(s) (LRB): AMPUTATION RIGHT GREAT TOE (Right) with continued cellulitis and some drainage from what appears to be venous stasis wounds on the anterior aspect of the right leg.  He and I and his son had a long conversation about treatment options.  We had originally discussed the possibility of trying to do a total hip this week just to try to help with his mobility.  I was a little bit concerned that there is a possibility that he had seeded the left hip and we got an MRI of the hip which shows significant fluid around the hip as well as severely collapsing femoral head.  At this point I am more thoughtful that  he needs about 2 weeks of IV antibiotic therapy to improve the situation even better.  He is likely going to need to be in a facility because of decreased mobility and a poor home situation.  At this point what we have decided is the most conservative course of action would be 2 weeks of IV antibiotic therapy to try to get the leg and is good a shape as we can get it.  This will give [place] time for the toe to heal and I will bring him back at that time for a left total hip replacement with the full understanding that he still has increased risk for infection or other complications.  We will start working immediately on an antibiotic regimen with infectious disease and a discharge plan with social work.  Discharge to SNF      Alta Corning 04/21/2022, 5:30 PM

## 2022-04-21 NOTE — Progress Notes (Signed)
Physical Therapy Treatment Patient Details Name: [name] MRN: [id number] DOB: [birth year] Today's Date: 04/21/2022   History of Present Illness Pt is an 81 yo male presenting for L-THA, AA but was found to have osteomyelitis of R-great toe which was amputated at the distal phalanx on 04/18/22.   PMH: CKD, BPH, GERD, HTN, HLD, NM disorder, SOB, back surgery, L-TKA 2016.    PT Comments    Pt ambulated 300' with RW, no loss of balance, distance limited by L hip pain and generalized fatigue.    Recommendations for follow up therapy are one component of a multi-disciplinary discharge planning process, led by the attending physician.  Recommendations may be updated based on patient status, additional functional criteria and insurance authorization.  Follow Up Recommendations  Follow physician's recommendations for discharge plan and follow up therapies     Assistance Recommended at Discharge Intermittent Supervision/Assistance  Patient can return home with the following A little help with walking and/or transfers;A little help with bathing/dressing/bathroom;Assistance with cooking/housework;Assist for transportation;Help with stairs or ramp for entrance   Equipment Recommendations  Rolling walker (2 wheels)    Recommendations for Other Services       Precautions / Restrictions Precautions Precautions: Fall Required Braces or Orthoses: Other Brace Other Brace: post-op shoe R LE, also prefers L post op shoe for height; h/o R foot drop (pt stated he's tried AFOs but they didn't work out) Restrictions Weight Bearing Restrictions: Yes RLE Weight Bearing: Weight bearing as tolerated Other Position/Activity Restrictions: with post op shoe     Mobility  Bed Mobility               General bed mobility comments: up in recliner    Transfers Overall transfer level: Needs assistance Equipment used: Rolling walker (2 wheels) Transfers: Sit to/from Stand Sit to Stand: From  elevated surface, Min guard           General transfer comment: verbal cues for hand placement    Ambulation/Gait Ambulation/Gait assistance: Min guard Gait Distance (Feet): 300 Feet Assistive device: Rolling walker (2 wheels) Gait Pattern/deviations: Step-to pattern, Decreased stance time - left, Wide base of support, Shuffle, Trunk flexed       General Gait Details: cues for posture and position from RW and with noted wide BOS and foot drop on R.   Stairs             Wheelchair Mobility    Modified Rankin (Stroke Patients Only)       Balance Overall balance assessment: Needs assistance Sitting-balance support: Feet supported, Single extremity supported Sitting balance-Leahy Scale: Good     Standing balance support: No upper extremity supported Standing balance-Leahy Scale: Fair                              Cognition Arousal/Alertness: Awake/alert Behavior During Therapy: WFL for tasks assessed/performed Overall Cognitive Status: Within Functional Limits for tasks assessed                                 General Comments: very loquacious        Exercises      General Comments        Pertinent Vitals/Pain Pain Assessment Pain Score: 6  Pain Location: L hip with walking Pain Descriptors / Indicators: Sore Pain Intervention(s): Premedicated before session    Home Living  Prior Function            PT Goals (current goals can now be found in the care plan section) Acute Rehab PT Goals Patient Stated Goal: To get my left hip replacement PT Goal Formulation: With patient Time For Goal Achievement: 04/25/22 Potential to Achieve Goals: Good Progress towards PT goals: Progressing toward goals    Frequency    7X/week      PT Plan Current plan remains appropriate    Co-evaluation              AM-PAC PT "6 Clicks" Mobility   Outcome Measure  Help needed turning from  your back to your side while in a flat bed without using bedrails?: None Help needed moving from lying on your back to sitting on the side of a flat bed without using bedrails?: A Little Help needed moving to and from a bed to a chair (including a wheelchair)?: A Little Help needed standing up from a chair using your arms (e.g., wheelchair or bedside chair)?: A Little Help needed to walk in hospital room?: A Little Help needed climbing 3-5 steps with a railing? : A Lot 6 Click Score: 18    End of Session   Activity Tolerance: Patient tolerated treatment well Patient left: in chair;with call bell/phone within reach;with chair alarm set Nurse Communication: Mobility status PT Visit Diagnosis: Difficulty in walking, not elsewhere classified (R26.2);Pain Pain - Right/Left: Left Pain - part of body: Hip     Time: 8182-9937 PT Time Calculation (min) (ACUTE ONLY): 49 min  Charges:  $Gait Training: 23-37 mins $Therapeutic Activity: 8-22 mins                    Blondell Reveal Kistler PT 04/21/2022  Acute Rehabilitation Services Pager [phone] Office [phone]

## 2022-04-21 NOTE — Care Plan (Signed)
Ortho Bundle Case Management Note  Patient Details  Name: ELISHUA RADFORD MRN: [id number] Date of Birth: [birth year]     Patient is scheduled to have is L ant THR on 04/23/22. The discharge plan is unchanged. He will discharge to home. He declined HHPT. OPPT set up with [facility].  Patient and MD in agreement with plan.                 DME Arranged:  Gilford Rile rolling DME Agency:  [employer]  HH Arranged:    [place] Agency:     Additional Comments: Please contact me with any questions of if this plan should need to change.  Ladell Heads,  [place] [facility] Specialist  [phone] 04/21/2022, 2:08 PM

## 2022-04-21 NOTE — Progress Notes (Signed)
Orthopedic Tech Progress Note Patient Details:  [name] [facility] 07/03/1941 [id number]  Ortho Devices Type of Ortho Device: Postop shoe/boot Ortho Device/Splint Location: 4 rolls of cast padding Ortho Device/Splint Interventions: Application   Post Interventions Patient Tolerated: Well  [name] 04/21/2022, 9:27 AM

## 2022-04-22 ENCOUNTER — Inpatient Hospital Stay: Payer: Self-pay

## 2022-04-22 ENCOUNTER — Inpatient Hospital Stay (HOSPITAL_COMMUNITY): Payer: Medicare Other

## 2022-04-22 DIAGNOSIS — M869 Osteomyelitis, unspecified: Principal | ICD-10-CM

## 2022-04-22 DIAGNOSIS — L03119 Cellulitis of unspecified part of limb: Secondary | ICD-10-CM

## 2022-04-22 LAB — SYNOVIAL CELL COUNT + DIFF, W/ CRYSTALS
Crystals, Fluid: NONE SEEN
WBC, Synovial: 82 /mm3 (ref 0–200)

## 2022-04-22 LAB — CREATININE, SERUM
Creatinine, Ser: 1.65 mg/dL — ABNORMAL HIGH (ref 0.61–1.24)
GFR, Estimated: 41 mL/min — ABNORMAL LOW (ref >60)

## 2022-04-22 MED ORDER — SODIUM CHLORIDE (PF) 0.9 % IJ SOLN
10.0000 mL | Freq: Once | INTRAMUSCULAR | Status: AC
  Administered 2022-04-22: 10 mL

## 2022-04-22 MED ORDER — VANCOMYCIN HCL IN DEXTROSE 1-5 GM/200ML-% IV SOLN
1000.0000 mg | INTRAVENOUS | Status: DC
Start: 2022-04-23 — End: 2022-04-23
  Administered 2022-04-23: 1000 mg via INTRAVENOUS
  Filled 2022-04-22: qty 200

## 2022-04-22 MED ORDER — IOHEXOL 300 MG/ML  SOLN
50.0000 mL | Freq: Once | INTRAMUSCULAR | Status: AC | PRN
  Administered 2022-04-22: 5 mL via INTRA_ARTERIAL

## 2022-04-22 MED ORDER — SODIUM CHLORIDE (PF) 0.9 % IJ SOLN
INTRAMUSCULAR | Status: AC
  Administered 2022-04-22: 10 mL via INTRA_ARTICULAR
  Filled 2022-04-22: qty 10

## 2022-04-22 MED ORDER — LIDOCAINE HCL 1 % IJ SOLN
INTRAMUSCULAR | Status: AC
  Administered 2022-04-22: 9 mL via SUBCUTANEOUS
  Filled 2022-04-22: qty 20

## 2022-04-22 MED ORDER — LIDOCAINE HCL (PF) 1 % IJ SOLN
30.0000 mL | Freq: Once | INTRAMUSCULAR | Status: AC
  Administered 2022-04-22: 30 mL via INTRADERMAL
  Filled 2022-04-22: qty 30

## 2022-04-22 NOTE — Progress Notes (Signed)
Subjective: 4 Days Post-Op Procedure(s) (LRB): AMPUTATION RIGHT GREAT TOE (Right) Patient reports pain as mild.    Objective: Vital signs in last 24 hours: Temp:  [97.9 F (36.6 C)-98 F (36.7 C)] 98 F (36.7 C) (06/12 2203) Pulse Rate:  [81-94] 81 (06/12 2203) Resp:  [18-19] 19 (06/12 2203) BP: (150-151)/(87-99) 151/87 (06/12 2203) SpO2:  [92 %-98 %] 98 % (06/13 0737)  Intake/Output from previous day: 06/12 0701 - 06/13 0700 In: 1480 [P.O.:1080; IV Piggyback:400] Out: 1770 [Urine:1770] Intake/Output this shift: No intake/output data recorded.  Recent Labs    04/20/22 1310  HGB 14.4   Recent Labs    04/20/22 1310  WBC 6.1  RBC 4.56  HCT 43.3  PLT 201   Recent Labs    04/22/22 0416  CREATININE 1.65*   No results for input(s): "LABPT", "INR" in the last 72 hours.  Neurologically intact ABD soft Neurovascular intact Sensation intact distally Intact pulses distally Dorsiflexion/Plantar flexion intact Dressing change yesterday there was still some pus over the areas of his anterior tibial wounds on the right side.  The incision from the toe amputation was a little dark at the skin edges.   Assessment/Plan: 4 Days Post-Op Procedure(s) (LRB): AMPUTATION RIGHT GREAT TOE (Right) Advance diet Up with therapy Discharge to SNF for 2 weeks of IV antibiotic therapy.I will follow him through the office and see what his incisions are doing.  If he is doing well then we will proceed with urgent left total hip replacement.  I just want to make sure that we get him in as good a shape as we can.      Alta Corning 04/22/2022, 8:41 AM

## 2022-04-22 NOTE — TOC Initial Note (Signed)
Transition of Care [facility]) - Initial/Assessment Note    Patient Details  Name: [name] MRN: [id number] Date of Birth: April 24, 1941  [facility] CM/SW Contact:    Lennart Pall, LCSW Phone Number: 04/22/2022, 1:00 PM  Clinical Narrative:                 Met with pt yesterday afternoon and again today to discuss dc planning needs.  Pt very pleasant and receptive to CSW assistance.  Pt confirms that he lives alone and adds that he is "in the process of trying to move... a lot of boxes around."  Has local friends but does not have 24/7 support.  Admits some frustration with his situation of postponement of THA due to need for toe amputation first.  Pt does note that MD has discussed with him this morning the likely need for SNF in order to have a couple of weeks of IV abx and therapy prior to then undergoing the THA.  He does not feel like this was a confirmed plan yet from his discussion with MD, however, I did explain that orders were placed for TOC to assist with SNF.  Pt resistant to CSW beginning SNF placement process and would like to speak with MD further.  Have alerted Dr. Berenice Primas office.  Will begin SNF placement process and follow up with pt again tomorrow.  Expected Discharge Plan: Skilled Nursing Facility Barriers to Discharge: Continued Medical Work up, SNF Pending bed offer   Patient Goals and CMS Choice Patient states their goals for this hospitalization and ongoing recovery are:: to return home following ortho surgeries      Expected Discharge Plan and Services Expected Discharge Plan: [place] In-house Referral: Clinical Social Work     Living arrangements for the past 2 months: Single Family Home                 DME Arranged: Walker rolling DME Agency: [employer]                  Prior Living Arrangements/Services Living arrangements for the past 2 months: Single Family Home Lives with:: Self Patient language and need for  interpreter reviewed:: Yes Do you feel safe going back to the place where you live?: Yes      Need for Family Participation in Patient Care: Yes (Comment) Care giver support system in place?: No (comment)   Criminal Activity/Legal Involvement Pertinent to Current Situation/Hospitalization: No - Comment as needed  Activities of Daily Living Home Assistive Devices/Equipment: Gilford Rile (specify type) ADL Screening (condition at time of admission) Patient's cognitive ability adequate to safely complete daily activities?: Yes Is the patient deaf or have difficulty hearing?: No Does the patient have difficulty seeing, even when wearing glasses/contacts?: No Does the patient have difficulty concentrating, remembering, or making decisions?: No Patient able to express need for assistance with ADLs?: Yes Does the patient have difficulty dressing or bathing?: No Independently performs ADLs?: Yes (appropriate for developmental age) Does the patient have difficulty walking or climbing stairs?: Yes Weakness of Legs: Both Weakness of Arms/Hands: None  Permission Sought/Granted Permission sought to share information with : Other (comment) Permission granted to share information with : Yes, Verbal Permission Granted  Share Information with NAME: Cyndy Freeze     Permission granted to share info w Relationship: daughter  Permission granted to share info w Contact Information: [phone]  Emotional Assessment Appearance:: Appears stated age Attitude/Demeanor/Rapport: Gracious Affect (typically observed): Accepting Orientation: : Oriented  to Self, Oriented to Place, Oriented to  Time, Oriented to Situation Alcohol / Substance Use: Not Applicable Psych Involvement: No (comment)  Admission diagnosis:  Osteomyelitis of great toe of right foot ([place]) [M86.9] Patient Active Problem List   Diagnosis Date Noted   Osteomyelitis of great toe of right foot ([place]) 04/18/2022   Left hip pain 04/09/2022   Upper  airway cough syndrome 02/03/2022   Acute rhinosinusitis 01/24/2022   Hypertension 09/30/2021   Allergic rhinitis due to pollen 03/25/2021   Benign prostatic hyperplasia with lower urinary tract symptoms 03/25/2021   Chronic fatigue syndrome 03/25/2021   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   Chronic kidney disease, stage 3 unspecified ([place]) 03/25/2021   Chronic pain 03/25/2021   Dyslipidemia 03/25/2021   Edema 03/25/2021   Generalized anxiety disorder 03/25/2021   Mild persistent asthma 03/25/2021   Moderate recurrent major depression ([place]) 03/25/2021   Morbid obesity ([place]) 03/25/2021   Non-pressure chronic ulcer of other part of right foot limited to breakdown of skin ([place]) 03/25/2021   Prediabetes 03/25/2021   Primary insomnia 03/25/2021   Sleep disturbance 03/25/2021   Paresthesia of skin 06/08/2020   Right foot drop 06/08/2020   Body mass index (BMI) 35.0-35.9, adult 03/13/2020   Spondylosis of cervical region without myelopathy or radiculopathy 01/05/2020   Elevated blood-pressure reading, without diagnosis of hypertension 12/07/2019   Spinal stenosis of lumbar region with neurogenic claudication 01/15/2017   Spondylolisthesis of lumbar region 01/15/2017   Claw toe, acquired, left 01/06/2017   Achilles tendon contracture, bilateral 01/06/2017   Pain in metatarsus of both feet 01/06/2017   Tubular adenoma of colon 12/29/2016   Basal cell carcinoma 11/12/7251   Umbilical hernia s/p lap repair with mesh 06/27/2016 06/27/2016   Degenerative arthritis of knee 03/22/2015   Dyspnea on exertion 01/31/2015   Lumbar radiculopathy 12/12/2014   Facet hypertrophy of lumbar region 12/12/2014   Left knee pain 12/12/2014   Ulcer of lower limb ([place]) 01/10/2014   Varicose veins of lower extremities with other complications 66/44/0347   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 12/21/2013   Renal insufficiency 12/21/2013   Swelling in head/neck 07/28/2012   Venous (peripheral)  insufficiency 05/25/2012   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   Pain in limb 02/12/2012   Hyperlipidemia 02/28/2008   Allergic rhinitis 02/28/2008   Sleep apnea 02/28/2008   PCP:  Cari Caraway, MD Pharmacy:   [id number] [address], [place] [address] Phone: [phone] Fax: [phone]     Social Determinants of Health (SDOH) Interventions    Readmission Risk Interventions    04/22/2022   12:57 PM  Readmission Risk Prevention Plan  Post Dischage Appt Complete  Medication Screening Complete  Transportation Screening Complete

## 2022-04-22 NOTE — Progress Notes (Signed)
PHARMACY CONSULT NOTE FOR:  OUTPATIENT  PARENTERAL ANTIBIOTIC THERAPY (OPAT)  Indication: Right great toe osteomyelitis/new fluid collection in left hip Regimen: Vancomycin 1000 mg IV every 24 hours + Ceftriaxone 2 gm IV every 24 hours  End date: 05/06/22   IV antibiotic discharge orders are pended. To discharging provider:  please sign these orders via discharge navigator,  Select New Orders & click on the button choice - Manage This Unsigned Work.     Thank you for allowing pharmacy to be a part of this patient's care.  Jimmy Footman, PharmD, BCPS, BCIDP Infectious Diseases Clinical Pharmacist Phone: [phone] 04/22/2022, 8:43 AM

## 2022-04-22 NOTE — Progress Notes (Signed)
Physical Therapy Treatment Patient Details Name: [name] MRN: [id number] DOB: February 26, 1941 Today's Date: 04/22/2022   History of Present Illness Pt is an 81 yo male presenting for L-THA, AA but was found to have osteomyelitis of R-great toe which was amputated at the distal phalanx on 04/18/22.   PMH: CKD, BPH, GERD, HTN, HLD, NM disorder, SOB, back surgery, L-TKA 2016.    PT Comments    General Comments: AxO x 3 very pleasant.  Assisted OOB to amb in hallway required increased time.  Applied B post op shoes (larger shoe on Right).  Pt able to self transition to EOB.  General transfer comment: 50% VC's on proper hand placement and safety with turns. General Gait Details: 50% VC's on proper sequencing and well as to avoid WBing thru R toe however pt unable to comply due to pain he is having in his L hip.  Pt was scheduled for a THR but toe infection had to be addressed first.  Have lean on walker due to R great toe pain and L hip pain. Pt lives Indep amb without any AD ,home alone and will need ST Rehab at SNF prior to returning safely.   Recommendations for follow up therapy are one component of a multi-disciplinary discharge planning process, led by the attending physician.  Recommendations may be updated based on patient status, additional functional criteria and insurance authorization.  Follow Up Recommendations  Follow physician's recommendations for discharge plan and follow up therapies     Assistance Recommended at Discharge Intermittent Supervision/Assistance  Patient can return home with the following A little help with walking and/or transfers;A little help with bathing/dressing/bathroom;Assistance with cooking/housework;Assist for transportation;Help with stairs or ramp for entrance   Equipment Recommendations  Rolling walker (2 wheels)    Recommendations for Other Services       Precautions / Restrictions Precautions Precautions: Fall Precaution Comments: OA L hip  (needs an urgent THR when medically stable from toe) Other Brace: post-op shoe R LE, also prefers L post op shoe for height; h/o R foot drop (pt stated he's tried AFOs but they didn't work out) Restrictions Weight Bearing Restrictions: Yes RLE Weight Bearing: Weight bearing as tolerated     Mobility  Bed Mobility Overal bed mobility: Needs Assistance Bed Mobility: Supine to Sit     Supine to sit: Supervision, HOB elevated     General bed mobility comments: self able with increased time and use of rails    Transfers Overall transfer level: Needs assistance Equipment used: Rolling walker (2 wheels) Transfers: Sit to/from Stand Sit to Stand: From elevated surface, Min guard, Min assist           General transfer comment: 50% VC's on proper hand placement and safety with turns.    Ambulation/Gait Ambulation/Gait assistance: Supervision, Min guard Gait Distance (Feet): 155 Feet Assistive device: Rolling walker (2 wheels) Gait Pattern/deviations: Step-to pattern, Decreased stance time - left, Wide base of support, Shuffle, Trunk flexed Gait velocity: decreased     General Gait Details: 50% VC's on proper sequencing and well as to avoid WBing thru R toe however pt unable to comply due to pain he is having in his L hip.  Pt was scheduled for a THR but toe infection had to be addressed first.  Have lean on walker due to R great toe pain and L hip pain.   Stairs             Wheelchair Mobility    Modified  Rankin (Stroke Patients Only)       Balance                                            Cognition Arousal/Alertness: Awake/alert Behavior During Therapy: WFL for tasks assessed/performed Overall Cognitive Status: Within Functional Limits for tasks assessed                                 General Comments: AxO x 3 very pleasant        Exercises      General Comments        Pertinent Vitals/Pain Pain Assessment Pain  Assessment: 0-10 Pain Score: 3  Pain Location: L hip with walking Pain Descriptors / Indicators: Sore, Sharp Pain Intervention(s): Monitored during session    Home Living                          Prior Function            PT Goals (current goals can now be found in the care plan section) Progress towards PT goals: Progressing toward goals    Frequency    7X/week      PT Plan Current plan remains appropriate    Co-evaluation              AM-PAC PT "6 Clicks" Mobility   Outcome Measure  Help needed turning from your back to your side while in a flat bed without using bedrails?: A Little Help needed moving from lying on your back to sitting on the side of a flat bed without using bedrails?: A Little Help needed moving to and from a bed to a chair (including a wheelchair)?: A Little Help needed standing up from a chair using your arms (e.g., wheelchair or bedside chair)?: A Little Help needed to walk in hospital room?: A Little Help needed climbing 3-5 steps with a railing? : A Lot 6 Click Score: 17    End of Session Equipment Utilized During Treatment: Gait belt Activity Tolerance: Patient limited by pain Patient left: in chair;with call bell/phone within reach;with chair alarm set Nurse Communication: Mobility status PT Visit Diagnosis: Difficulty in walking, not elsewhere classified (R26.2);Pain Pain - Right/Left: Left Pain - part of body: Hip     Time: 0932-6712 PT Time Calculation (min) (ACUTE ONLY): 22 min  Charges:  $Gait Training: 8-22 mins                     Rica Koyanagi  PTA Acute  Rehabilitation Services Pager      [phone] Office      [phone]

## 2022-04-22 NOTE — Care Management Important Message (Signed)
Important Message  Patient Details IM Letter placed in Patients room. Name: [name] MRN: [id number] Date of Birth: [birth year]   Medicare Important Message Given:  Yes     Kerin Salen 04/22/2022, 11:37 AM

## 2022-04-22 NOTE — Progress Notes (Signed)
At bedside to insert PICC, patient wants to talk to MD first before proceeding for PICC placement. Primary RN made aware. Will follow up.

## 2022-04-22 NOTE — Discharge Instructions (Signed)
Patient should be up and ambulating as much is possible.  Full weightbearing on the right side been up and ambulating.  Dressing should be left intact until we see him in the office.

## 2022-04-22 NOTE — Progress Notes (Signed)
Pharmacy Antibiotic Note  [name] is a 81 y.o. male admitted on 04/18/2022 with right great toe osteomyelitis, now s/p amputation with some ulcerative changes, concern for cellulitis vs vascular changes. Also new noted join effusion in MRI of left hip.  Pharmacy has been consulted for IV vancomycin dosing.  SCr today 1.65 (Baseline appears to be 1.6-1.7). CrCl ~ 43 ml/min.   We will switch his vancomycin to a once a day regimen today to ease dosing for his nursing facility.    Plan: Switch Vancomycin to 1000 mg every 24 hours (Predicted AUC 530 with SCr 1.65 and VD 0.5).  Rocephin per MD Monitor clinical progress, renal function,  and vancomycin levels as indicated Will discharge with 2 weeks of vancomycin and ceftriaxone ending 6/27     Height: 5' 9.02" (175.3 cm) Weight: 112.5 kg (248 lb 0.3 oz) IBW/kg (Calculated) : 70.74  Temp (24hrs), Avg:98 F (36.7 C), Min:97.9 F (36.6 C), Max:98 F (36.7 C)  Recent Labs  Lab 04/16/22 1036 04/19/22 0343 04/20/22 1310 04/22/22 0416  WBC 5.7 7.3 6.1  --   CREATININE 1.71* 1.72*  --  1.65*     Estimated Creatinine Clearance: 43.4 mL/min (A) (by C-G formula based on SCr of 1.65 mg/dL (H)).    No Known Allergies  Antimicrobials this admission: 6/9 Ancef >> 6/9 6/9 Rocephin >> (6/27) 6/9 Vancomycin >>(6/27)   Microbiology results:  6/7 Surgical MRSA PCR: negative  Thank you for allowing pharmacy to be a part of this patient's care.  Jimmy Footman, PharmD, BCPS, BCIDP Infectious Diseases Clinical Pharmacist Phone: [phone] 04/22/2022 8:34 AM

## 2022-04-22 NOTE — NC FL2 (Signed)
[place] MEDICAID FL2 LEVEL OF CARE SCREENING TOOL     IDENTIFICATION  Patient Name: [name] Birthdate: 02/15/1941 Sex: male Admission Date (Current Location): 04/18/2022  [facility] and [place] Number:  Herbalist and Address:  [facility],  [place], [place]      Provider Number: [id number]  Attending Physician Name and Address:  [name], MD  Relative Name and Phone Number:  daughter, Cyndy Freeze [phone]    Current Level of Care: Hospital Recommended Level of Care: [place] Prior Approval Number:    Date Approved/Denied:   PASRR Number: [id number]  Discharge Plan: SNF    Current Diagnoses: Patient Active Problem List   Diagnosis Date Noted   Osteomyelitis of great toe of right foot ([place]) 04/18/2022   Left hip pain 04/09/2022   Upper airway cough syndrome 02/03/2022   Acute rhinosinusitis 01/24/2022   Hypertension 09/30/2021   Allergic rhinitis due to pollen 03/25/2021   Benign prostatic hyperplasia with lower urinary tract symptoms 03/25/2021   Chronic fatigue syndrome 03/25/2021   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   Chronic kidney disease, stage 3 unspecified ([place]) 03/25/2021   Chronic pain 03/25/2021   Dyslipidemia 03/25/2021   Edema 03/25/2021   Generalized anxiety disorder 03/25/2021   Mild persistent asthma 03/25/2021   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/25/2021   Morbid obesity ([place]) 03/25/2021   Non-pressure chronic ulcer of other part of right foot limited to breakdown of skin ([place]) 03/25/2021   Prediabetes 03/25/2021   Primary insomnia 03/25/2021   Sleep disturbance 03/25/2021   Paresthesia of skin 06/08/2020   Right foot drop 06/08/2020   Body mass index (BMI) 35.0-35.9, adult 03/13/2020   Spondylosis of cervical region without myelopathy or radiculopathy 01/05/2020   Elevated blood-pressure reading, without diagnosis of hypertension 12/07/2019   Spinal stenosis of lumbar  region with neurogenic claudication 01/15/2017   Spondylolisthesis of lumbar region 01/15/2017   Claw toe, acquired, left 01/06/2017   Achilles tendon contracture, bilateral 01/06/2017   Pain in metatarsus of both feet 01/06/2017   Tubular adenoma of colon 12/29/2016   Basal cell carcinoma 44/31/5400   Umbilical hernia s/p lap repair with mesh 06/27/2016 06/27/2016   Degenerative arthritis of knee 03/22/2015   Dyspnea on exertion 01/31/2015   Lumbar radiculopathy 12/12/2014   Facet hypertrophy of lumbar region 12/12/2014   Left knee pain 12/12/2014   Ulcer of lower limb ([place]) 01/10/2014   Varicose veins of lower extremities with other complications 86/76/1950   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 12/21/2013   Renal insufficiency 12/21/2013   Swelling in head/neck 07/28/2012   Venous (peripheral) insufficiency 05/25/2012   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   Pain in limb 02/12/2012   Hyperlipidemia 02/28/2008   Allergic rhinitis 02/28/2008   Sleep apnea 02/28/2008    Orientation RESPIRATION BLADDER Height & Weight     Self, Time, Situation, Place  Normal Continent Weight: 248 lb 0.3 oz (112.5 kg) Height:  5' 9.02" (175.3 cm)  BEHAVIORAL SYMPTOMS/MOOD NEUROLOGICAL BOWEL NUTRITION STATUS      Continent Diet  AMBULATORY STATUS COMMUNICATION OF NEEDS Skin   Extensive Assist Verbally Surgical wounds (compression dressing at site of Right great toe amputation)                       Personal Care Assistance Level of Assistance  Bathing, Dressing Bathing Assistance: Limited assistance   Dressing Assistance: Limited assistance     Functional Limitations  Info             SPECIAL CARE FACTORS FREQUENCY  PT (By licensed PT), OT (By licensed OT) (IV antibiotics)     PT Frequency: 5x/wk OT Frequency: 5x/wk            Contractures Contractures Info: Not present    Additional Factors Info  Code Status, Allergies, Psychotropic Code Status  Info: Full Allergies Info: NKDA Psychotropic Info: see MAR         Current Medications (04/22/2022):  This is the current hospital active medication list Current Facility-Administered Medications  Medication Dose Route Frequency Provider Last Rate Last Admin   0.9 %  sodium chloride infusion   Intravenous Continuous Gary Fleet, PA-C   Stopped at 04/19/22 0850   0.9 %  sodium chloride infusion   Intravenous PRN [name], MD 10 mL/hr at 04/20/22 0521 New Bag at 04/20/22 0521   acetaminophen (TYLENOL) tablet 325-650 mg  325-650 mg Oral Q6H PRN Gary Fleet, PA-C       albuterol (PROVENTIL) (2.5 MG/3ML) 0.083% nebulizer solution 3 mL  3 mL Inhalation Q6H PRN Gary Fleet, PA-C       alum & mag hydroxide-simeth (MAALOX/MYLANTA) 200-200-20 MG/5ML suspension 30 mL  30 mL Oral Q4H PRN McKenzie, Lennie Muckle, PA-C   30 mL at 04/19/22 2056   bisacodyl (DULCOLAX) EC tablet 5 mg  5 mg Oral Daily PRN Gary Fleet, PA-C       buPROPion (WELLBUTRIN XL) 24 hr tablet 450 mg  450 mg Oral q morning Gary Fleet, PA-C   450 mg at 04/22/22 1050   calcium carbonate (TUMS - dosed in mg elemental calcium) chewable tablet 200 mg of elemental calcium  1 tablet Oral QAC breakfast [name], MD   200 mg of elemental calcium at 04/22/22 1051   calcium carbonate (TUMS - dosed in mg elemental calcium) chewable tablet 200-400 mg of elemental calcium  1-2 tablet Oral TID PRN [name], MD   400 mg of elemental calcium at 04/21/22 1842   cefTRIAXone (ROCEPHIN) 2 g in sodium chloride 0.9 % 100 mL IVPB  2 g Intravenous Q24H Thayer Headings, MD 200 mL/hr at 04/21/22 1848 2 g at 04/21/22 1848   docusate sodium (COLACE) capsule 100 mg  100 mg Oral BID Gary Fleet, PA-C   100 mg at 04/22/22 1051   doxazosin (CARDURA) tablet 8 mg  8 mg Oral QHS Gary Fleet, PA-C   8 mg at 04/21/22 2126   fluticasone (FLONASE) 50 MCG/ACT nasal spray 2 spray  2 spray Each Nare Daily McKenzie, Kayla J, PA-C   2 spray at 04/22/22  1051   furosemide (LASIX) tablet 40 mg  40 mg Oral Daily [name], MD   40 mg at 04/22/22 1051   furosemide (LASIX) tablet 40 mg  40 mg Oral Daily PRN [name], MD       gabapentin (NEURONTIN) capsule 600 mg  600 mg Oral QHS Gary Fleet, PA-C   600 mg at 04/21/22 2125   HYDROcodone-acetaminophen (NORCO/VICODIN) 5-325 MG per tablet 1-2 tablet  1-2 tablet Oral Q4H PRN Gary Fleet, PA-C   2 tablet at 04/22/22 1051   methocarbamol (ROBAXIN) tablet 500 mg  500 mg Oral Q6H PRN Gary Fleet, PA-C   500 mg at 04/22/22 1051   Or   methocarbamol (ROBAXIN) 500 mg in dextrose 5 % 50 mL IVPB  500 mg Intravenous Q6H PRN Gary Fleet, PA-C  mirtazapine (REMERON) tablet 15 mg  15 mg Oral QHS Gary Fleet, PA-C   15 mg at 04/21/22 2125   mometasone-formoterol (DULERA) 200-5 MCG/ACT inhaler 2 puff  2 puff Inhalation BID Gary Fleet, PA-C   2 puff at 04/22/22 0737   montelukast (SINGULAIR) tablet 10 mg  10 mg Oral QHS Gary Fleet, PA-C   10 mg at 04/21/22 2125   morphine (PF) 2 MG/ML injection 0.5-1 mg  0.5-1 mg Intravenous Q4H PRN Gary Fleet, PA-C       ondansetron [facility]) tablet 4 mg  4 mg Oral Q6H PRN Gary Fleet, PA-C       Or   ondansetron [facility]) injection 4 mg  4 mg Intravenous Q6H PRN Gary Fleet, PA-C       pantoprazole (PROTONIX) EC tablet 40 mg  40 mg Oral Daily Gary Fleet, PA-C   40 mg at 04/22/22 1051   phenol (CHLORASEPTIC) mouth spray 1 spray  1 spray Mouth/Throat PRN McKenzie, Lennie Muckle, PA-C   1 spray at 04/19/22 1204   polyethylene glycol (MIRALAX / GLYCOLAX) packet 17 g  17 g Oral Daily PRN Gary Fleet, PA-C       potassium chloride SA (KLOR-CON M) CR tablet 20 mEq  20 mEq Oral BID Gary Fleet, PA-C   20 mEq at 04/22/22 1051   rosuvastatin (CRESTOR) tablet 5 mg  5 mg Oral q morning Gary Fleet, PA-C   5 mg at 04/22/22 1050   [START ON 04/23/2022] vancomycin (VANCOCIN) IVPB 1000 mg/200 mL premix  1,000 mg Intravenous Q24H Susa Raring, [facility]          Discharge Medications: Please see discharge summary for a list of discharge medications.  Relevant Imaging Results:  Relevant Lab Results:   Additional Information anticipate IV abx ~ 2 weeks; SS# [id number]  Kinston Magnan, LCSW

## 2022-04-22 NOTE — Progress Notes (Signed)
    [place] for Infectious Disease   Reason for visit: Follow up on cellulitis  Interval History: no fever, no chills   Physical Exam: Constitutional:  Vitals:   04/22/22 0737 04/22/22 1359  BP:  139/73  Pulse:  (!) 105  Resp:    Temp:  98.6 F (37 C)  SpO2: 98% 92%   patient appears in NAD Respiratory: Normal respiratory effort; CTA B Cardiovascular: RRR MS: leg wrapped  Review of Systems: Constitutional: negative for fevers and chills  Lab Results  Component Value Date   WBC 6.1 04/20/2022   HGB 14.4 04/20/2022   HCT 43.3 04/20/2022   MCV 95.0 04/20/2022   PLT 201 04/20/2022    Lab Results  Component Value Date   CREATININE 1.65 (H) 04/22/2022   BUN 30 (H) 04/19/2022   NA 142 04/19/2022   K 3.8 04/19/2022   CL 107 04/19/2022   CO2 27 04/19/2022    Lab Results  Component Value Date   ALT 35 12/02/2021   AST 47 (H) 12/02/2021   ALKPHOS 103 12/02/2021     Microbiology: Recent Results (from the past 240 hour(s))  Surgical pcr screen     Status: None   Collection Time: 04/16/22 10:36 AM   Specimen: Nasal Mucosa; Nasal Swab  Result Value Ref Range Status   MRSA, PCR NEGATIVE NEGATIVE Final   Staphylococcus aureus NEGATIVE NEGATIVE Final    Comment: (NOTE) The Xpert SA Assay (FDA approved for NASAL specimens in patients 32 years of age and older), is one component of a comprehensive surveillance program. It is not intended to diagnose infection nor to guide or monitor treatment. Performed at [facility], [place] [address] [place], [place] [address]     Impression/Plan:  1. Cellulitis associated with a leg ulcer, some purulence - I have discussed the case with Dr. Berenice Primas and with some purulence and tolerating the antibiotics he is on, I will have him continue with IV vancomycin and ceftriaxone for 2 more weeks and he can be reassessed at that time.    2.  Osteomyelitis - now s/p amputation and no new concerns.  On antibiotics  as above.   3.  Access - I will order a picc line.  This was discussed with the patient.   Above discussed with Dr. Berenice Primas.  Will continue to follow.

## 2022-04-23 DIAGNOSIS — I5032 Chronic diastolic (congestive) heart failure: Secondary | ICD-10-CM | POA: Diagnosis present

## 2022-04-23 DIAGNOSIS — R7303 Prediabetes: Secondary | ICD-10-CM | POA: Diagnosis present

## 2022-04-23 DIAGNOSIS — Z9889 Other specified postprocedural states: Secondary | ICD-10-CM | POA: Diagnosis not present

## 2022-04-23 DIAGNOSIS — E569 Vitamin deficiency, unspecified: Secondary | ICD-10-CM | POA: Diagnosis not present

## 2022-04-23 DIAGNOSIS — F411 Generalized anxiety disorder: Secondary | ICD-10-CM | POA: Diagnosis not present

## 2022-04-23 DIAGNOSIS — M84652A Pathological fracture in other disease, left femur, initial encounter for fracture: Secondary | ICD-10-CM | POA: Diagnosis present

## 2022-04-23 DIAGNOSIS — Z471 Aftercare following joint replacement surgery: Secondary | ICD-10-CM | POA: Diagnosis not present

## 2022-04-23 DIAGNOSIS — E669 Obesity, unspecified: Secondary | ICD-10-CM | POA: Diagnosis present

## 2022-04-23 DIAGNOSIS — F418 Other specified anxiety disorders: Secondary | ICD-10-CM | POA: Diagnosis not present

## 2022-04-23 DIAGNOSIS — S72052A Unspecified fracture of head of left femur, initial encounter for closed fracture: Secondary | ICD-10-CM | POA: Diagnosis not present

## 2022-04-23 DIAGNOSIS — Z7689 Persons encountering health services in other specified circumstances: Secondary | ICD-10-CM | POA: Diagnosis not present

## 2022-04-23 DIAGNOSIS — F331 Major depressive disorder, recurrent, moderate: Secondary | ICD-10-CM | POA: Diagnosis not present

## 2022-04-23 DIAGNOSIS — N1831 Chronic kidney disease, stage 3a: Secondary | ICD-10-CM | POA: Diagnosis not present

## 2022-04-23 DIAGNOSIS — M86171 Other acute osteomyelitis, right ankle and foot: Secondary | ICD-10-CM | POA: Diagnosis not present

## 2022-04-23 DIAGNOSIS — I509 Heart failure, unspecified: Secondary | ICD-10-CM | POA: Diagnosis not present

## 2022-04-23 DIAGNOSIS — I50812 Chronic right heart failure: Secondary | ICD-10-CM | POA: Diagnosis present

## 2022-04-23 DIAGNOSIS — I83018 Varicose veins of right lower extremity with ulcer other part of lower leg: Secondary | ICD-10-CM | POA: Diagnosis present

## 2022-04-23 DIAGNOSIS — M25552 Pain in left hip: Secondary | ICD-10-CM | POA: Diagnosis not present

## 2022-04-23 DIAGNOSIS — G4730 Sleep apnea, unspecified: Secondary | ICD-10-CM | POA: Diagnosis not present

## 2022-04-23 DIAGNOSIS — R509 Fever, unspecified: Secondary | ICD-10-CM | POA: Diagnosis not present

## 2022-04-23 DIAGNOSIS — I10 Essential (primary) hypertension: Secondary | ICD-10-CM | POA: Diagnosis not present

## 2022-04-23 DIAGNOSIS — I5030 Unspecified diastolic (congestive) heart failure: Secondary | ICD-10-CM | POA: Diagnosis not present

## 2022-04-23 DIAGNOSIS — G479 Sleep disorder, unspecified: Secondary | ICD-10-CM | POA: Diagnosis not present

## 2022-04-23 DIAGNOSIS — I872 Venous insufficiency (chronic) (peripheral): Secondary | ICD-10-CM | POA: Diagnosis not present

## 2022-04-23 DIAGNOSIS — K5900 Constipation, unspecified: Secondary | ICD-10-CM | POA: Diagnosis not present

## 2022-04-23 DIAGNOSIS — G8929 Other chronic pain: Secondary | ICD-10-CM | POA: Diagnosis not present

## 2022-04-23 DIAGNOSIS — R609 Edema, unspecified: Secondary | ICD-10-CM | POA: Diagnosis not present

## 2022-04-23 DIAGNOSIS — M25531 Pain in right wrist: Secondary | ICD-10-CM | POA: Diagnosis not present

## 2022-04-23 DIAGNOSIS — N1832 Chronic kidney disease, stage 3b: Secondary | ICD-10-CM | POA: Diagnosis not present

## 2022-04-23 DIAGNOSIS — M1990 Unspecified osteoarthritis, unspecified site: Secondary | ICD-10-CM | POA: Diagnosis not present

## 2022-04-23 DIAGNOSIS — Z89411 Acquired absence of right great toe: Secondary | ICD-10-CM | POA: Diagnosis not present

## 2022-04-23 DIAGNOSIS — Z9842 Cataract extraction status, left eye: Secondary | ICD-10-CM | POA: Diagnosis not present

## 2022-04-23 DIAGNOSIS — M868X7 Other osteomyelitis, ankle and foot: Secondary | ICD-10-CM | POA: Diagnosis not present

## 2022-04-23 DIAGNOSIS — E785 Hyperlipidemia, unspecified: Secondary | ICD-10-CM | POA: Diagnosis not present

## 2022-04-23 DIAGNOSIS — R051 Acute cough: Secondary | ICD-10-CM | POA: Diagnosis not present

## 2022-04-23 DIAGNOSIS — Z89022 Acquired absence of left finger(s): Secondary | ICD-10-CM | POA: Diagnosis not present

## 2022-04-23 DIAGNOSIS — M1612 Unilateral primary osteoarthritis, left hip: Secondary | ICD-10-CM

## 2022-04-23 DIAGNOSIS — R062 Wheezing: Secondary | ICD-10-CM | POA: Diagnosis not present

## 2022-04-23 DIAGNOSIS — R058 Other specified cough: Secondary | ICD-10-CM | POA: Diagnosis not present

## 2022-04-23 DIAGNOSIS — Z9841 Cataract extraction status, right eye: Secondary | ICD-10-CM | POA: Diagnosis not present

## 2022-04-23 DIAGNOSIS — M1811 Unilateral primary osteoarthritis of first carpometacarpal joint, right hand: Secondary | ICD-10-CM | POA: Diagnosis not present

## 2022-04-23 DIAGNOSIS — Z8349 Family history of other endocrine, nutritional and metabolic diseases: Secondary | ICD-10-CM | POA: Diagnosis not present

## 2022-04-23 DIAGNOSIS — F32A Depression, unspecified: Secondary | ICD-10-CM | POA: Diagnosis present

## 2022-04-23 DIAGNOSIS — Z89421 Acquired absence of other right toe(s): Secondary | ICD-10-CM | POA: Diagnosis not present

## 2022-04-23 DIAGNOSIS — R531 Weakness: Secondary | ICD-10-CM | POA: Diagnosis not present

## 2022-04-23 DIAGNOSIS — K219 Gastro-esophageal reflux disease without esophagitis: Secondary | ICD-10-CM | POA: Diagnosis not present

## 2022-04-23 DIAGNOSIS — M879 Osteonecrosis, unspecified: Secondary | ICD-10-CM | POA: Diagnosis present

## 2022-04-23 DIAGNOSIS — L03115 Cellulitis of right lower limb: Secondary | ICD-10-CM | POA: Diagnosis present

## 2022-04-23 DIAGNOSIS — J4530 Mild persistent asthma, uncomplicated: Secondary | ICD-10-CM | POA: Diagnosis not present

## 2022-04-23 DIAGNOSIS — L97819 Non-pressure chronic ulcer of other part of right lower leg with unspecified severity: Secondary | ICD-10-CM | POA: Diagnosis present

## 2022-04-23 DIAGNOSIS — M869 Osteomyelitis, unspecified: Secondary | ICD-10-CM | POA: Diagnosis not present

## 2022-04-23 DIAGNOSIS — Z96642 Presence of left artificial hip joint: Secondary | ICD-10-CM | POA: Diagnosis not present

## 2022-04-23 DIAGNOSIS — J301 Allergic rhinitis due to pollen: Secondary | ICD-10-CM | POA: Diagnosis not present

## 2022-04-23 DIAGNOSIS — I110 Hypertensive heart disease with heart failure: Secondary | ICD-10-CM | POA: Diagnosis not present

## 2022-04-23 DIAGNOSIS — Z7401 Bed confinement status: Secondary | ICD-10-CM | POA: Diagnosis not present

## 2022-04-23 DIAGNOSIS — I130 Hypertensive heart and chronic kidney disease with heart failure and stage 1 through stage 4 chronic kidney disease, or unspecified chronic kidney disease: Secondary | ICD-10-CM | POA: Diagnosis present

## 2022-04-23 DIAGNOSIS — G4733 Obstructive sleep apnea (adult) (pediatric): Secondary | ICD-10-CM | POA: Diagnosis not present

## 2022-04-23 DIAGNOSIS — Z96652 Presence of left artificial knee joint: Secondary | ICD-10-CM | POA: Diagnosis present

## 2022-04-23 DIAGNOSIS — R093 Abnormal sputum: Secondary | ICD-10-CM | POA: Diagnosis not present

## 2022-04-23 DIAGNOSIS — N401 Enlarged prostate with lower urinary tract symptoms: Secondary | ICD-10-CM | POA: Diagnosis present

## 2022-04-23 DIAGNOSIS — G9332 Myalgic encephalomyelitis/chronic fatigue syndrome: Secondary | ICD-10-CM | POA: Diagnosis not present

## 2022-04-23 SURGERY — ARTHROPLASTY, HIP, TOTAL, ANTERIOR APPROACH
Anesthesia: Spinal | Site: Hip | Laterality: Left

## 2022-04-23 MED ORDER — SODIUM CHLORIDE 0.9% FLUSH: 10.0000 mL | INTRAVENOUS | Status: DC | PRN

## 2022-04-23 MED ORDER — ACETAMINOPHEN 325 MG PO TABS: 325.0000 mg | ORAL_TABLET | Freq: Four times a day (QID) | ORAL | 0 refills | Status: DC | PRN

## 2022-04-23 MED ORDER — HYDROCODONE-ACETAMINOPHEN 5-325 MG PO TABS: ORAL_TABLET | ORAL | 0 refills | Status: DC

## 2022-04-23 MED ORDER — CHLORHEXIDINE GLUCONATE CLOTH 2 % EX PADS
6.0000 | MEDICATED_PAD | Freq: Every day | CUTANEOUS | Status: DC
  Administered 2022-04-23: 6 via TOPICAL

## 2022-04-23 MED ORDER — DOCUSATE SODIUM 100 MG PO CAPS: 100.0000 mg | ORAL_CAPSULE | Freq: Two times a day (BID) | ORAL | 0 refills | Status: DC

## 2022-04-23 MED ORDER — VANCOMYCIN IV (FOR PTA / DISCHARGE USE ONLY): 1000.0000 mg | INTRAVENOUS | 0 refills | Status: DC

## 2022-04-23 MED ORDER — BISACODYL 5 MG PO TBEC: 5.0000 mg | DELAYED_RELEASE_TABLET | Freq: Every day | ORAL | 0 refills | Status: DC | PRN

## 2022-04-23 MED ORDER — POLYETHYLENE GLYCOL 3350 17 G PO PACK: 17.0000 g | PACK | Freq: Every day | ORAL | 0 refills | Status: DC | PRN

## 2022-04-23 MED ORDER — CEFTRIAXONE IV (FOR PTA / DISCHARGE USE ONLY): 2.0000 g | INTRAVENOUS | 0 refills | Status: DC

## 2022-04-23 NOTE — TOC Transition Note (Signed)
Transition of Care [facility]) - CM/SW Discharge Note   Patient Details  Name: [name] MRN: [id number] Date of Birth: 1941/06/03  Transition of Care [facility]) CM/SW Contact:  Lennart Pall, LCSW Phone Number: 04/23/2022, 2:06 PM   Clinical Narrative:    Pt medically ready for dc to [place] this afternoon.  Pt aware and agreeable.  PTAR called at 1:50 pm.  RN to call report to [phone].  No further TOC needs.   Final next level of care: Skilled Nursing Facility Barriers to Discharge: Barriers Resolved   Patient Goals and CMS Choice Patient states their goals for this hospitalization and ongoing recovery are:: to return home following ortho surgeries      Discharge Placement   Existing PASRR number confirmed : 04/22/22          Patient chooses bed at: [facility] Patient to be transferred to facility by: [facility] Name of family member notified: pt to notify Patient and family notified of of transfer: 04/23/22  Discharge Plan and Services In-house Referral: Clinical Social Work              DME Arranged: [place] rolling DME Agency: [employer]                  Social Determinants of Health (SDOH) Interventions     Readmission Risk Interventions    04/22/2022   12:57 PM  Readmission Risk Prevention Plan  Post Dischage Appt Complete  Medication Screening Complete  Transportation Screening Complete

## 2022-04-23 NOTE — TOC Progression Note (Signed)
Transition of Care [facility]) - Progression Note    Patient Details  Name: [name] MRN: [id number] Date of Birth: 01-30-1941  Transition of Care [facility]) CM/SW Contact  Lennart Pall, LCSW Phone Number: 04/23/2022, 10:06 AM  Clinical Narrative:    Have reviewed SNF bed offers and pt has accepted [facility].  Currently waiting for PICC placement and dc summary from MD.   Expected Discharge Plan: [place] Barriers to Discharge: Continued Medical Work up, SNF Pending bed offer  Expected Discharge Plan and Services Expected Discharge Plan: [place] In-house Referral: Clinical Social Work     Living arrangements for the past 2 months: Single Family Home                 DME Arranged: [place] rolling DME Agency: [place]                   Social Determinants of Health (SDOH) Interventions    Readmission Risk Interventions    04/22/2022   12:57 PM  Readmission Risk Prevention Plan  Post Dischage Appt Complete  Medication Screening Complete  Transportation Screening Complete

## 2022-04-23 NOTE — Discharge Summary (Signed)
Patient ID: [name] MRN: [id number] DOB/AGE: 04/01/1941 81 y.o.  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 male who was admitted 04/18/2022 for operative treatment ofOsteomyelitis of great toe of right foot (HCC). Patient has severe unremitting pain that affects sleep, daily activities, and work/hobbies. After pre-op clearance the patient was taken to the operating room on 04/18/2022 and underwent  Procedure(s): AMPUTATION RIGHT GREAT TOE.    Patient was given perioperative antibiotics:  Anti-infectives (From admission, onward)    Start     Dose/Rate Route Frequency Ordered Stop   04/23/22 0600  vancomycin (VANCOCIN) IVPB 1000 mg/200 mL  premix        1,000 mg 200 mL/hr over 60 Minutes Intravenous Every 24 hours 04/22/22 0842     04/23/22 0000  cefTRIAXone (ROCEPHIN) IVPB        2 g Intravenous Every 24 hours 04/23/22 1155 05/07/22 2359   04/23/22 0000  vancomycin IVPB        1,000 mg Intravenous Every 24 hours 04/23/22 1155 05/06/22 2359   04/18/22 2000  ceFAZolin (ANCEF) IVPB 2g/100 mL premix  Status:  Discontinued       Note to Pharmacy: We will do Ancef until Dr Comer of ID makes other recommendations   2 g 200 mL/hr over 30 Minutes Intravenous Every 8 hours 04/18/22 1554 04/18/22 1654   04/18/22 1800  vancomycin (VANCOREADY) IVPB 1500 mg/300 mL  Status:  Discontinued        1,500 mg 150 mL/hr over 120 Minutes Intravenous Every 36 hours 04/18/22 1651 04/22/22 0842   04/18/22 1800  cefTRIAXone (ROCEPHIN) 2 g in sodium chloride 0.9 % 100 mL IVPB        2 g 200 mL/hr over 30 Minutes Intravenous Every 24 hours 04/18/22 1654     04/18/22 1715  cefTRIAXone (ROCEPHIN) 2 g in sodium chloride 0.9 % 100 mL IVPB  Status:  Discontinued        2 g   200 mL/hr over 30 Minutes Intravenous Every 24 hours 04/18/22 1624 04/18/22 1625   04/18/22 0845  ceFAZolin (ANCEF) IVPB 2g/100 mL premix        2 g 200 mL/hr over 30 Minutes Intravenous On call to O.R. 04/18/22 7989 04/18/22 1114        Patient was given sequential compression devices, early ambulation, and chemoprophylaxis to prevent DVT.  Infectious disease was consulted and they recommended IV vancomycin and ceftriaxone.  A PICC line was placed prior to the patient's discharge.  Patient benefited maximally from hospital stay and there were no complications.    Recent vital signs: Patient Vitals for the past 24 hrs:  BP Temp Temp src Pulse SpO2  04/23/22 0830 -- -- -- -- 97 %  04/22/22 2243 (!) 177/89 98.2 F (36.8 C) Oral 83 94 %  04/22/22 1936 -- -- -- -- 90 %  04/22/22 1359 139/73 98.6 F (37 C) Oral (!) 105 92 %     Recent laboratory studies:  Recent Labs     04/20/22 1310 04/22/22 0416  WBC 6.1  --   HGB 14.4  --   HCT 43.3  --   PLT 201  --   CREATININE  --  1.65*     Discharge Medications:   Allergies as of 04/23/2022   No Known Allergies      Medication List     STOP taking these medications    B-D 3CC LUER-LOK SYR 21GX1-1/2 21G X 1-1/2" 3 ML Misc Generic drug: SYRINGE-NEEDLE (DISP) 3 ML   Breztri Aerosphere 160-9-4.8 MCG/ACT Aero Generic drug: Budeson-Glycopyrrol-Formoterol   fluticasone 50 MCG/ACT nasal spray Commonly known as: Flonase   glucosamine-chondroitin 500-400 MG tablet   guaiFENesin 600 MG 12 hr tablet Commonly known as: Mucinex   meloxicam 15 MG tablet Commonly known as: MOBIC   NEEDLE (DISP) 21 G 21G X 1-1/2" Misc   Syringe (Reusable) 3 ML Misc   testosterone cypionate 200 MG/ML injection Commonly known as: DEPOTESTOSTERONE CYPIONATE       TAKE these medications    acetaminophen 325 MG tablet Commonly known as: TYLENOL Take 1-2 tablets (325-650 mg total) by mouth every 6 (six) hours as needed for mild pain (pain score 1-3 or temp > 100.5).   albuterol 108 (90 Base) MCG/ACT inhaler Commonly known as: VENTOLIN HFA Inhale 2 puffs into the lungs every 6 (six) hours as needed for wheezing or shortness of breath.   bisacodyl 5 MG EC tablet Commonly known as: DULCOLAX Take 1 tablet (5 mg total) by mouth daily as needed for moderate constipation.   budesonide-formoterol 160-4.5 MCG/ACT inhaler Commonly known as: SYMBICORT Inhale 2 puffs into the lungs 2 (two) times daily.   buPROPion 150 MG 24 hr tablet Commonly known as: WELLBUTRIN XL Take 450 mg by mouth every morning.   CALCIUM PO Take 1 tablet by mouth every morning.   cefTRIAXone  IVPB Commonly known as: ROCEPHIN Inject 2 g into the vein daily for 14 days. Indication:  R great toe osteomyelitis/left hip effusion First Dose: Yes Last Day of Therapy:  05/06/22  Labs - Once weekly:  CBC/D and BMP, Labs - Every other week:  ESR and  CRP   docusate sodium 100 MG capsule Commonly known as: COLACE Take 1 capsule (100 mg total) by mouth 2 (two) times daily.   doxazosin 8 MG tablet Commonly known as: CARDURA TAKE 1 TABLET BY MOUTH DAILY  . Take at night What changed:  how much to  take how to take this when to take this additional instructions   esomeprazole 20 MG capsule Commonly known as: NEXIUM Take 20 mg by mouth every morning.   furosemide 40 MG tablet Commonly known as: LASIX Take 40 mg by mouth daily. What changed: Another medication with the same name was removed. Continue taking this medication, and follow the directions you see here.   gabapentin 300 MG capsule Commonly known as: NEURONTIN TAKE 1 TO 2 CAPSULES BY MOUTH AT BEDTIME What changed: See the new instructions.   HYDROcodone-acetaminophen 5-325 MG tablet Commonly known as: Norco 1 q 6 hrs prn pain What changed: additional instructions   methocarbamol 500 MG tablet Commonly known as: ROBAXIN Take 1 tablet (500 mg total) by mouth every 8 (eight) hours as needed for muscle spasms. What changed: when to take this   mirtazapine 15 MG tablet Commonly known as: REMERON Take 15 mg by mouth at bedtime.   montelukast 10 MG tablet Commonly known as: SINGULAIR Take 1 tablet (10 mg total) by mouth at bedtime.   multivitamin with minerals Tabs tablet Take 1 tablet by mouth every morning.   polyethylene glycol 17 g packet Commonly known as: MIRALAX / GLYCOLAX Take 17 g by mouth daily as needed for mild constipation.   potassium chloride SA 20 MEQ tablet Commonly known as: KLOR-CON M Take 1 tablet (20 mEq total) by mouth 2 (two) times daily.   rosuvastatin 5 MG tablet Commonly known as: CRESTOR Take 5 mg by mouth every morning.   vancomycin  IVPB Inject 1,000 mg into the vein daily for 13 days. Indication:  R great toe osteomyelitis/left hip effusion First Dose: Yes Last Day of Therapy:  05/06/22  Labs - Sunday/Monday:  CBC/D, BMP,  and vancomycin trough. Labs - Thursday:  BMP and vancomycin trough Labs - Every other week:  ESR and CRP               Home Infusion Instuctions  (From admission, onward)           Start     Ordered   04/23/22 0000  Home infusion instructions       Question:  Instructions  Answer:  Flushing of vascular access device: 0.9% NaCl pre/post medication administration and prn patency; Heparin 100 u/ml, 5ml for implanted ports and Heparin 10u/ml, 5ml for all other central venous catheters.   04/23/22 1155              Discharge Care Instructions  (From admission, onward)           Start     Ordered   04/23/22 0000  Weight bearing as tolerated       Comments: Wear post op shoe on right foot when up  Question:  Laterality  Answer:  bilateral   04/23/22 1155            Diagnostic Studies: DG FLUORO GUIDED NEEDLE PLC ASPIRATION/INJECTION LOC  Result Date: 04/22/2022 CLINICAL DATA:  Concern for infection EXAM: LEFT HIP ASPIRATION UNDER FLUOROSCOPY COMPARISON:  None Available. FLUOROSCOPY: Radiation Exposure Index (as provided by the fluoroscopic device): 0.7 mGy Kerma PROCEDURE: Overlying skin prepped with ChloraPrep, draped in the usual sterile fashion, and infiltrated locally with buffered Lidocaine. Curved 20 gauge spinal needle advanced to the superolateral margin of the left femoral head/neck junction utilizing an inferior to superior approach. Less than 0.5 mL serosanguineous fluid was aspirated from the joint and mixed with sterile saline. This was ultimately sent to the laboratory   for analysis. Additional lateral approach was attempted utilizing both 20 gauge and 18 gauge needles with minimal to no fluid return upon aspiration. A small amount of 1:! contrast mixed with saline was injected to ensure adequate needle placement in the joint. The patient experienced postprocedural pain. On exam, there was a normal femoral pulse, no bleeding from the puncture site, and no  significant bruising. Slight flexion of the hip and knee mildly alleviated the pain. IMPRESSION: Left hip aspiration under fluoroscopy yielding a small amount of serosanguineous fluid. Sample was sent to the laboratory for analysis. The patient experienced postprocedural pain. On exam, there was a normal femoral pulse, no bleeding from the puncture site, and no significant bruising. Slight flexion of the hip and knee mildly alleviated the pain. Recommend close clinical observation for worsening symptoms. These results were called by telephone at the time of interpretation on 04/22/2022 at approximately 10:30 AM to provider [facility] , who verbally acknowledged these results. Electronically Signed   By: Maurine Simmering M.D.   On: 04/22/2022 11:27   [place] EKG SITE RITE  Result Date: 04/22/2022 If Site Rite image not attached, placement could not be confirmed due to current cardiac rhythm.  MR HIP LEFT WO CONTRAST  Result Date: 04/21/2022 CLINICAL DATA:  Hip osteonecrosis, surgical planning. EXAM: MR OF THE LEFT HIP WITHOUT CONTRAST TECHNIQUE: Multiplanar, multisequence MR imaging was performed. No intravenous contrast was administered. COMPARISON:  Pelvic and right hip radiographs 03/26/2020. Pelvic CT 08/08/2021. Pelvic MRI 09/20/2021. FINDINGS: Bones: Interval development of moderate flattening of the left femoral head superiorly with bone marrow edema throughout the femoral head and neck. There is also progressive erosion of left acetabulum superiorly with associated mild subchondral edema. No serpiginous subchondral signal abnormalities typical for osteonecrosis. The right femoral head is intact. The visualized bony pelvis is otherwise intact. Degenerative changes are present at the L5-S1 disc space. The visualized sacroiliac joints and symphysis pubis appear normal. Articular cartilage and labrum Articular cartilage: As above, new flattening of the left femoral head with progressive erosion of the left superior  acetabulum. Labrum: Severe degeneration of the left acetabular labrum. Joint or bursal effusion Joint effusion: Moderate-sized joint effusion has enlarged compared with the prior study. Fluid extends anteriorly into the left iliopsoas bursa. Bursae: Moderate fluid in the left iliopsoas bursa, communicating with the hip joint. No other focal fluid collections. There is ill-defined edema lateral to the left greater trochanter. Muscles and tendons Muscles and tendons: The visualized gluteus, hamstring and iliopsoas tendons appear normal. There is edema within the left gluteus and adductor muscles. The piriformis muscles appear symmetric. Other findings Miscellaneous: Prominent external iliac lymph nodes are similar to the previous study, likely reactive. The visualized internal pelvic contents otherwise appear unremarkable. IMPRESSION: 1. Significant progression of left hip arthropathy compared with previous MRI of 7 months ago. There is a moderate-sized joint effusion with fluid extending into the iliopsoas bursa, flattening of the left femoral head, acetabular erosion and surrounding bone marrow and soft tissue edema. Findings are not typical for femoral head osteonecrosis. Consider indolent infection and Charcot/neuropathic joint. 2. Stable appearance of the right hip and sacroiliac joints. Spondylosis at L5-S1. 3. No focal periarticular fluid collections. Electronically Signed   By: Richardean Sale M.D.   On: 04/21/2022 13:02    Disposition: Discharge disposition: 03-Skilled Nursing Facility       Discharge Instructions     Call MD / Call 911   Complete by: As directed    If you  experience chest pain or shortness of breath, CALL 911 and be transported to the hospital emergency room.  If you develope a fever above 101 F, pus (white drainage) or increased drainage or redness at the wound, or calf pain, call your surgeon's office.   Diet general   Complete by: As directed    Home infusion instructions    Complete by: As directed    Instructions: Flushing of vascular access device: 0.9% NaCl pre/post medication administration and prn patency; Heparin 100 u/ml, 37m for implanted ports and Heparin 10u/ml, 534mfor all other central venous catheters.   Increase activity slowly as tolerated   Complete by: As directed    Post-operative opioid taper instructions:   Complete by: As directed    POST-OPERATIVE OPIOID TAPER INSTRUCTIONS: It is important to wean off of your opioid medication as soon as possible. If you do not need pain medication after your surgery it is ok to stop day one. Opioids include: Codeine, Hydrocodone(Norco, Vicodin), Oxycodone(Percocet, oxycontin) and hydromorphone amongst others.  Long term and even short term use of opiods can cause: Increased pain response Dependence Constipation Depression Respiratory depression And more.  Withdrawal symptoms can include Flu like symptoms Nausea, vomiting And more Techniques to manage these symptoms Hydrate well Eat regular healthy meals Stay active Use relaxation techniques(deep breathing, meditating, yoga) Do Not substitute Alcohol to help with tapering If you have been on opioids for less than two weeks and do not have pain than it is ok to stop all together.  Plan to wean off of opioids This plan should start within one week post op of your joint replacement. Maintain the same interval or time between taking each dose and first decrease the dose.  Cut the total daily intake of opioids by one tablet each day Next start to increase the time between doses. The last dose that should be eliminated is the evening dose.      Weight bearing as tolerated   Complete by: As directed    Wear post op shoe on right foot when up   Laterality: bilateral        Contact information for follow-up providers     GrDorna LeitzMD Follow up on 04/29/2022.   Specialty: Orthopedic Surgery Why: 4 dressing Change and wound evaluation.Call  office for appt Contact information: [address] [phone]            Contact information for after-discharge care     Destination     [facility] .   Service: Skilled Nursing Contact information: [phone]                    Signed: JaErlene Senters/14/2023, 12:00 PM

## 2022-04-23 NOTE — Progress Notes (Signed)
Peripherally Inserted Central Catheter Placement  The IV Nurse has discussed with the patient and/or persons authorized to consent for the patient, the purpose of this procedure and the potential benefits and risks involved with this procedure.  The benefits include less needle sticks, lab draws from the catheter, and the patient may be discharged home with the catheter. Risks include, but not limited to, infection, bleeding, blood clot (thrombus formation), and puncture of an artery; nerve damage and irregular heartbeat and possibility to perform a PICC exchange if needed/ordered by physician.  Alternatives to this procedure were also discussed.  Bard Power PICC patient education guide, fact sheet on infection prevention and patient information card has been provided to patient /or left at bedside.    PICC Placement Documentation  PICC Single Lumen 50/72/25 Right Basilic 38 cm 0 cm (Active)  Indication for Insertion or Continuance of Line Home intravenous therapies (PICC only) 04/23/22 1219  Exposed Catheter (cm) 0 cm 04/23/22 1219  Site Assessment Clean, Dry, Intact 04/23/22 1219  Line Status Flushed;Saline locked;Blood return noted 04/23/22 1219  Dressing Type Transparent;Securing device 04/23/22 1219  Dressing Status Antimicrobial disc in place 04/23/22 [place] Not Applicable 75/05/18 3358  Dressing Change Due 04/30/22 04/23/22 [place] 04/23/2022, 12:21 PM

## 2022-04-24 ENCOUNTER — Encounter (HOSPITAL_BASED_OUTPATIENT_CLINIC_OR_DEPARTMENT_OTHER): Payer: Medicare Other | Admitting: General Surgery

## 2022-04-24 LAB — SURGICAL PATHOLOGY

## 2022-04-24 NOTE — Telephone Encounter (Signed)
Addendum to most recent OV made for surgical clearance. Thanks.

## 2022-04-24 NOTE — Progress Notes (Signed)
Addendum 04/24/2022: Fax received from Dr. [name] with [facility] to perform a Left total hip arthroplasty on patient.  Patient needs surgery clearance   Factors that increase the risk for postoperative pulmonary complications are age, asthma, OSA, obesity  Respiratory complications generally occur in 1% of ASA Class I patients, 5% of ASA Class II and 10% of ASA Class III-IV patients These complications rarely result in mortality and include postoperative pneumonia, atelectasis, pulmonary embolism, ARDS and increased time requiring postoperative mechanical ventilation.   Overall, I recommend proceeding with the surgery if the risk for respiratory complications are outweighed by the potential benefits. This will need to be discussed between the patient and surgeon.   To reduce risks of respiratory complications, I recommend: --Pre- and post-operative incentive spirometry performed frequently while awake --Inpatient use of currently prescribed positive-pressure for OSA whenever the patient is sleeping --Inpatient use of currently prescribed inhalers; aggressive pulmonary toilet with O2 PRN, bronchodilatation as needed, and early ambulation --Short duration of surgery as much as possible and avoid paralytic if possible   1) RISK FOR PROLONGED MECHANICAL VENTILAION - > 48h  1A) Arozullah - Prolonged mech ventilation risk Arozullah Postperative Pulmonary Risk Score - for mech ventilation dependence >48h [employer], Ann [facility]urg 2000, major non-cardiac surgery) Comment Score  Type of surgery - abd ao aneurysm (27), thoracic (21), neurosurgery / upper abdominal / vascular (21), neck (11) Knee surgery 0  Emergency Surgery - (11)  0  ALbumin < 3 or poor nutritional state - (9)  0  BUN > 30 -  (8)  0  Partial or completely dependent functional status - (7)  0  COPD -  (6) asthma 6  Age - 60 to 69 (4), > 70  (6)  6  TOTAL  12  Risk Stratifcation scores  - < 10 (0.5%), 11-19 (1.8%),  20-27 (4.2%), 28-40 (10.1%), >40 (26.6%)  1.8%

## 2022-04-24 NOTE — Telephone Encounter (Signed)
Fax received from Dr. [name] with [facility] to perform a Left total hip arthroplasty on patient.  Patient needs surgery clearance. Patient was seen on 04/09/2022. Office protocol is a risk assessment can be sent to surgeon if patient has been seen in 60 days or less.   Sending to [facility] for risk assessment or recommendations if patient needs to be seen in office prior to surgical procedure.

## 2022-04-25 LAB — BODY FLUID CULTURE W GRAM STAIN
Culture: NO GROWTH
Gram Stain: NONE SEEN

## 2022-04-28 DIAGNOSIS — R062 Wheezing: Secondary | ICD-10-CM | POA: Diagnosis not present

## 2022-04-28 DIAGNOSIS — M1990 Unspecified osteoarthritis, unspecified site: Secondary | ICD-10-CM | POA: Diagnosis not present

## 2022-04-28 DIAGNOSIS — N1832 Chronic kidney disease, stage 3b: Secondary | ICD-10-CM | POA: Diagnosis not present

## 2022-04-29 ENCOUNTER — Other Ambulatory Visit: Payer: Self-pay | Admitting: Orthopedic Surgery

## 2022-04-29 DIAGNOSIS — M25531 Pain in right wrist: Secondary | ICD-10-CM | POA: Diagnosis not present

## 2022-04-29 DIAGNOSIS — M1811 Unilateral primary osteoarthritis of first carpometacarpal joint, right hand: Secondary | ICD-10-CM | POA: Diagnosis not present

## 2022-04-29 DIAGNOSIS — Z9889 Other specified postprocedural states: Secondary | ICD-10-CM | POA: Diagnosis not present

## 2022-04-30 ENCOUNTER — Telehealth: Payer: Self-pay | Admitting: *Deleted

## 2022-04-30 ENCOUNTER — Inpatient Hospital Stay: Payer: Medicare Other | Admitting: Internal Medicine

## 2022-04-30 ENCOUNTER — Telehealth: Payer: Self-pay

## 2022-04-30 NOTE — Telephone Encounter (Signed)
Called patient to see if he would be able to make it today's appointment, no answer. Left HIPAA compliant voicemail requesting callback.   Beryle Flock, RN

## 2022-04-30 NOTE — Telephone Encounter (Signed)
Called patient and started to go over [place] recommendations but patient stopped me and said someone else had already called him and got him set up with a visit with Dr Vaughan Browner. I told patient that I will put a note in the computer but if he needed anything from [place] to give our office a call. Patient verbalized understanding. Nothing further needed at this time

## 2022-04-30 NOTE — Telephone Encounter (Signed)
ATC LVMTCB x 1  

## 2022-04-30 NOTE — Telephone Encounter (Signed)
Patient called to inform the doctor that he is at [facility] which is where he was supposed to have a hip replacement on Friday.  After being there he was informed that he has pneumonia and they have postponed the surgery at this time.  He would like to speak with the nurse or doctor regarding this diagnosis and get some advice as to what his next steps should be.  Please call patient to discuss at [phone].

## 2022-04-30 NOTE — Telephone Encounter (Signed)
He can come in for OV with CXR for further evaluation or have them repeat CXR at the facility he is at, if able. Sometimes imaging can lag behind clinical improvement though. If he has already completed an abx, it may just take some time for the chest congestion to improve. I would advise he use mucinex [id number] mg Twice daily, which the provider at [facility] can order. Deep breathing exercises and OOB as much as possible. Given he has not had any increased SOB and no wheezing, do not think he needs prednisone at this point but would defer that to the provider at the facility. Thanks.

## 2022-04-30 NOTE — Telephone Encounter (Signed)
Primary Pulmonologist: Mannam Last office visit and with whom: 04/09/2022 Cobb What do we see them for (pulmonary problems): Asthma Last OV assessment/plan:   Assessment & Plan Note by Clayton Bibles, NP at 04/09/2022 12:58 PM  Author: Clayton Bibles, NP Author Type: Nurse Practitioner Filed: 04/09/2022 12:58 PM  Note Status: Written Cosign: Cosign Not Required Encounter Date: 04/09/2022  Problem: Venous (peripheral) insufficiency  Editor: Clayton Bibles, NP (Nurse Practitioner)               Stable symptoms with chronic BLE edema. Currently on lasix daily. Advise he follow up with cardiology as he hasn't been seen in a while.         Assessment & Plan Note by Clayton Bibles, NP at 04/09/2022 12:57 PM  Author: Clayton Bibles, NP Author Type: Nurse Practitioner Filed: 04/09/2022 12:57 PM  Note Status: Written Cosign: Cosign Not Required Encounter Date: 04/09/2022  Problem: Left hip pain  Editor: Clayton Bibles, NP (Nurse Practitioner)               Chronic with progressive worsening. No recent injury. Awaiting workup with ortho for possible surgical intervention. Feels like this negatively impacts his breathing with mobility.         Assessment & Plan Note by Clayton Bibles, NP at 04/09/2022 12:56 PM  Author: Clayton Bibles, NP Author Type: Nurse Practitioner Filed: 04/09/2022 12:56 PM  Note Status: Written Cosign: Cosign Not Required Encounter Date: 04/09/2022  Problem: Allergic rhinitis  Editor: Clayton Bibles, NP (Nurse Practitioner)               Well-controlled on current regimen. Continue singulair and flonase.        Patient Instructions by Clayton Bibles, NP at 04/09/2022 10:00 AM  Author: Clayton Bibles, NP Author Type: Nurse Practitioner Filed: 04/09/2022 10:52 AM  Note Status: Addendum Cosign: Cosign Not Required Encounter Date: 04/09/2022  Editor: Clayton Bibles, NP (Nurse Practitioner)      Prior Versions: 1. Clayton Bibles, NP  (Nurse Practitioner) at 04/09/2022 10:40 AM - Addendum   2. Clayton Bibles, NP (Nurse Practitioner) at 04/09/2022 10:38 AM - Addendum   3. Clayton Bibles, NP (Nurse Practitioner) at 04/09/2022 10:35 AM - Addendum   4. Clayton Bibles, NP (Nurse Practitioner) at 04/09/2022 10:34 AM - Signed    Restart Breztri 2 puffs Twice daily. Brush tongue and rinse mouth afterwards. Samples today. We are checking on your Az&Me paperwork -Continue Albuterol inhaler 2 puffs or 3 mL neb every 6 hours as needed for shortness of breath or wheezing. Notify if symptoms persist despite rescue inhaler/neb use. -Continue nexium 40 mg daily  -Continue flonase nasal spray 2 sprays each nostril daily  -Continue lasix 40 mg daily as prescribed by cardiology -Continue Saline nasal spray 2-3 times a day for nasal congestion -Continue Singulair 10 mg At bedtime  -Continue Astelin nasal spray 2 sprays each nostril Twice daily as needed   Continue CPAP nightly, minimum 4-6 hours a night   Take all of your inhalers and your CPAP machine with you to surgery. Out of bed as soon as possible after surgery. Use incentive spirometer (you should be provided with one at the hospital) 10 times an hour while awake following your procedure.    Follow up in 3 months with Dr. Vaughan Browner. If symptoms do not improve or worsen, please contact office for sooner follow up or seek emergency care.  Orthostatic Vitals Recorded in This Encounter   04/09/2022  1025     Patient Position: Sitting  BP Location: Right Arm  Cuff Size: Large   Instructions  Restart Breztri 2 puffs Twice daily. Brush tongue and rinse mouth afterwards. Samples today. We are checking on your Az&Me paperwork -Continue Albuterol inhaler 2 puffs or 3 mL neb every 6 hours as needed for shortness of breath or wheezing. Notify if symptoms persist despite rescue inhaler/neb use. -Continue nexium 40 mg daily  -Continue flonase nasal spray 2 sprays each nostril daily   -Continue lasix 40 mg daily as prescribed by cardiology -Continue Saline nasal spray 2-3 times a day for nasal congestion -Continue Singulair 10 mg At bedtime  -Continue Astelin nasal spray 2 sprays each nostril Twice daily as needed   Continue CPAP nightly, minimum 4-6 hours a night   Take all of your inhalers and your CPAP machine with you to surgery. Out of bed as soon as possible after surgery. Use incentive spirometer (you should be provided with one at the hospital) 10 times an hour while awake following your procedure.    Follow up in 3 months with Dr. Vaughan Browner. If symptoms do not improve or worsen, please contact office for sooner follow up or seek emergency care.       Was appointment offered to patient (explain)?  Offered, wanted to see if providers here could talk with provider at [facility].   Reason for call:  Called and spoke with patient, he was getting ready for his surgery at [facility] on Friday 6/9 and the anesthesiologist looked at his leg/foot and was concerned (wound center had been working with an infection for the past year, skin splitting open on shin because of fluid).  The orthopedic surgeon decided not to do the hip surgery for fear that the infection may go to the new hip.  They ended up amputating 1/2 of his big toe, put him on antibiotic and kept him in the hospital to see if things would improve so he could have the surgery.  He stayed through Tuesday and did not feel he was ready to have the hip surgery.  He is supposed to have the surgery on 7/3.  He was sent to [facility] (for rehab).  He reported some congestion in his chest/throat so they ordered a chest x-ray.  He found out he had a patch on pna.  He was put on an antibiotic and has been on it for 4-5 days and does not really see any improvement.  He denies any fever.  He reports congestion, coughing up yellow/green mucous and a sore throat.  He does not report any sob.  He is not requiring a rescue inhaler or any  nebulizer treatments.  He is using his maintenance inhaler, equivalent of Symbicort.  He is wondering what the next step should be.  I offered him an appointment and he said he has to use their transportation and a staff member has to come with them and stay with them at all times.  He was wondering if our provider could speak with the provider at [facility] and decide what is best.  He said he will get a phone # to call to speak to someone involved in his care to find out what antibiotics he is on (he is on one for pna and one for the infection in his foot/let).  He said he would call back with that information.  Advised him I would  make Katie aware.  Katie, do you have any recommendations?  (examples of things to ask: : When did symptoms start? Fever? Cough? Productive? Color to sputum? More sputum than usual? Wheezing? Have you needed increased oxygen? Are you taking your respiratory medications? What over the counter measures have you tried?)  No Known Allergies  Immunization History  Administered Date(s) Administered   Hepatitis A, Adult 07/31/2016   Influenza Split 09/28/2012, 09/10/2020, 08/12/2021   Influenza,inj,Quad PF,6+ Mos 08/03/2013, 07/21/2014, 09/13/2015, 07/23/2016, 07/14/2019   Moderna Covid-19 Vaccine Bivalent Booster 39yr & up 09/03/2021   Moderna Sars-Covid-2 Vaccination 11/21/2019, 12/19/2019, 08/24/2020, 05/04/2021   Pneumococcal Conjugate-13 03/25/2015   Pneumococcal Polysaccharide-23 04/29/2011, 06/28/2016   Tdap 04/25/2013

## 2022-05-01 ENCOUNTER — Other Ambulatory Visit: Payer: Self-pay

## 2022-05-01 ENCOUNTER — Encounter (HOSPITAL_COMMUNITY): Payer: Self-pay | Admitting: Orthopedic Surgery

## 2022-05-01 ENCOUNTER — Telehealth (INDEPENDENT_AMBULATORY_CARE_PROVIDER_SITE_OTHER): Payer: Medicare Other | Admitting: Pulmonary Disease

## 2022-05-01 DIAGNOSIS — G4730 Sleep apnea, unspecified: Secondary | ICD-10-CM | POA: Diagnosis not present

## 2022-05-01 DIAGNOSIS — Z7689 Persons encountering health services in other specified circumstances: Secondary | ICD-10-CM | POA: Diagnosis not present

## 2022-05-01 DIAGNOSIS — R093 Abnormal sputum: Secondary | ICD-10-CM | POA: Diagnosis not present

## 2022-05-01 DIAGNOSIS — J4530 Mild persistent asthma, uncomplicated: Secondary | ICD-10-CM

## 2022-05-01 DIAGNOSIS — R051 Acute cough: Secondary | ICD-10-CM | POA: Diagnosis not present

## 2022-05-01 NOTE — Progress Notes (Unsigned)
[name]    [id number]    [birth year]  Primary Care Physician:McNeill, Abigail Butts, MD  Referring Physician: Cari Caraway, MD [place],  [place] [id number]  Chief complaint: Follow up for dyspnea  HPI: 81 year old with history of sleep apnea, allergic rhinitis, hyperlipidemia Referred for evaluation of dyspnea.  Symptoms have been going on for the past 2 years.  He has dyspnea on exertion and sometimes at rest.  Associated with wheeze, chest tightness.  Currently on albuterol inhaler.  Previously had tried Advair but cannot tell if it made a difference.  He stopped using it as it was too expensive.  Has seasonal allergies, significant issues with GERD and is on Nexium.  History also noted for colonic polyps.  Previously followed by Dr. Benson Norway Reports seeing blood in the stool recently and intermittent right upper quadrant pain.  Follows with Dr. Gwenlyn Found, cardiology with normal cardiac catheterization in 2008.  Echocardiogram and stress test in 2018 within normal limits.  Symptoms not felt to be related to the heart.  Has bilateral lower extremity edema which is attributed to venous insufficiency.  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  Reports significant basement dampness and flooding at home with mold issues.  No hot tub, Jacuzzi, down pillows, comforters Smoking history: Never smoker Travel history: No significant travel history Relevant family history: No significant family history of lung disease  Interim history: Continues on Symbicort Continues to have significant dyspnea on exertion   He did not end up getting the low back surgery for spinal stenosis due to concern for his overall health status and ability to withstand surgery.  Outpatient Encounter Medications as of 05/01/2022  Medication Sig   acetaminophen (TYLENOL) 325 MG tablet Take 1-2 tablets (325-650 mg  total) by mouth every 6 (six) hours as needed for mild pain (pain score 1-3 or temp > 100.5).   albuterol (VENTOLIN HFA) 108 (90 Base) MCG/ACT inhaler Inhale 2 puffs into the lungs every 6 (six) hours as needed for wheezing or shortness of breath.   bisacodyl (DULCOLAX) 5 MG EC tablet Take 1 tablet (5 mg total) by mouth daily as needed for moderate constipation.   budesonide-formoterol (SYMBICORT) 160-4.5 MCG/ACT inhaler Inhale 2 puffs into the lungs 2 (two) times daily.   buPROPion (WELLBUTRIN XL) 150 MG 24 hr tablet Take 450 mg by mouth every morning.   CALCIUM PO Take 1 tablet by mouth every morning.   cefTRIAXone (ROCEPHIN) IVPB Inject 2 g into the vein daily for 14 days. Indication:  R great toe osteomyelitis/left hip effusion First Dose: Yes Last Day of Therapy:  05/06/22  Labs - Once weekly:  CBC/D and BMP, Labs - Every other week:  ESR and CRP   docusate sodium (COLACE) 100 MG capsule Take 1 capsule (100 mg total) by mouth 2 (two) times daily.   doxazosin (CARDURA) 8 MG tablet TAKE 1 TABLET BY MOUTH DAILY  . Take at night (Patient taking differently: Take 8 mg by mouth at bedtime.)   esomeprazole (NEXIUM) 20 MG capsule Take 20 mg by mouth every morning.   furosemide (LASIX) 40 MG tablet Take 40 mg by mouth daily.   gabapentin (NEURONTIN) 300 MG capsule TAKE 1 TO 2 CAPSULES BY MOUTH AT BEDTIME (Patient taking differently: Take 600 mg by mouth at bedtime.)   HYDROcodone-acetaminophen (NORCO) 5-325 MG tablet 1 q 6 hrs prn pain  methocarbamol (ROBAXIN) 500 MG tablet Take 1 tablet (500 mg total) by mouth every 8 (eight) hours as needed for muscle spasms. (Patient taking differently: Take 500 mg by mouth in the morning and at bedtime.)   mirtazapine (REMERON) 15 MG tablet Take 15 mg by mouth at bedtime.   montelukast (SINGULAIR) 10 MG tablet Take 1 tablet (10 mg total) by mouth at bedtime.   Multiple Vitamin (MULTIVITAMIN WITH MINERALS) TABS tablet Take 1 tablet by mouth every morning.    polyethylene glycol (MIRALAX / GLYCOLAX) 17 g packet Take 17 g by mouth daily as needed for mild constipation.   potassium chloride SA (K-DUR,KLOR-CON) 20 MEQ tablet Take 1 tablet (20 mEq total) by mouth 2 (two) times daily.   rosuvastatin (CRESTOR) 5 MG tablet Take 5 mg by mouth every morning.   vancomycin IVPB Inject 1,000 mg into the vein daily for 13 days. Indication:  R great toe osteomyelitis/left hip effusion First Dose: Yes Last Day of Therapy:  05/06/22  Labs - Sunday/Monday:  CBC/D, BMP, and vancomycin trough. Labs - Thursday:  BMP and vancomycin trough Labs - Every other week:  ESR and CRP   No facility-administered encounter medications on file as of 05/01/2022.    Allergies as of 05/01/2022   (No Known Allergies)   Physical Exam: Blood pressure 122/72, pulse 74, temperature 97.8 F (36.6 C), temperature source Oral, height $RemoveBefo'5\' 9"'QonzJFYwIzy$  (1.753 m), weight 255 lb 6.4 oz (115.8 kg), SpO2 97 %. Gen:      No acute distress HEENT:  EOMI, sclera anicteric Neck:     No masses; no thyromegaly Lungs:    Clear to auscultation bilaterally; normal respiratory effort CV:         Regular rate and rhythm; no murmurs Abd:      + bowel sounds; soft, non-tender; no palpable masses, no distension Ext:    No edema; adequate peripheral perfusion Skin:      Warm and dry; no rash Neuro: alert and oriented x 3 Psych: normal mood and affect   Data Reviewed: Imaging: CTA 08/13/2018 no pulmonary embolism.  Bronchial wall thickening.  Dependent atelectasis.  CT chest 08/16/2019-mild dependent atelectasis, No evidence of interstitial lung disease. I have reviewed the images personally.  CTA 10/18/2019-no pulmonary embolism or acute disease  Chest x-ray 04/05/2021-chronic interstitial and bronchitic changes I have reviewed the images personally.  PFTs  11/25/2019 FVC 3.21 [82%], FEV1 2.61 [93%], F/F 81, TLC 5.93 [87%], DLCO 21.42 [90%] Normal test.  Cardiac: Echocardiogram 02/18/2017- LVEF 55-60%,  grade 1 diastolic dysfunction  Nuclear stress test 02/19/2017-mild diffuse hypokinesis, EF 45%.  Low risk study with no ischemia identified.   Labs: CMP 08/05/2019-significant for creatinine of 1.64, hepatic panel within normal limits CBC FasTRACT on 7/22-WBC 6.1, eos 3%, absolute eosinophil count 183 IgE 08/05/2019-89 Hypersensitivity panel 08/05/2019-negative  BNP 04/05/2021-24  SARS-CoV-2 09/06/2019-negative  Sleep PSG 12/01/2018- severe sleep apnea with AHI 90.6 desaturations to 76%. CPAP titration/2/20- recommend trial CPAP on 11  Assessment:  Mild asthma Suspect reactive airway disease, asthma, with contribution from deconditioning, body habitus.  He has significant mold exposure at home but no evidence of hypersensitivity pneumonitis on labs or CT scan  Continues to have dyspnea on exertion.  Cardiac work-up noted as before with normal EF and BNP Since he continues to be symptomatic with chest x-ray showing mild interstitial changes I will get a high-res CT for better evaluation Continue Symbicort He will benefit from weight loss with diet and exercise but his mobility  is limited with back issues, spinal stenosis.  GERD Continue Nexium  Severe Obstructive sleep apnea Compliant with CPAP  Plan/Recommendations: - Symbicort twice daily - Encouraged to lose weight - High-res CT Marshell Garfinkel MD [facility] 05/01/2022, 11:49 AM  CC: Cari Caraway, MD

## 2022-05-05 ENCOUNTER — Other Ambulatory Visit: Payer: Self-pay

## 2022-05-05 ENCOUNTER — Encounter (HOSPITAL_COMMUNITY): Payer: Self-pay | Admitting: Orthopedic Surgery

## 2022-05-05 ENCOUNTER — Encounter: Payer: Self-pay | Admitting: Pulmonary Disease

## 2022-05-05 ENCOUNTER — Inpatient Hospital Stay (HOSPITAL_COMMUNITY)
Admission: RE | Admit: 2022-05-05 | Discharge: 2022-05-09 | DRG: 522 | Disposition: A | Discharge status: Skilled Nursing Facility | Payer: Medicare Other | Attending: Orthopedic Surgery | Admitting: Orthopedic Surgery

## 2022-05-05 ENCOUNTER — Ambulatory Visit (HOSPITAL_COMMUNITY): Payer: Medicare Other

## 2022-05-05 ENCOUNTER — Ambulatory Visit (HOSPITAL_COMMUNITY): Payer: Medicare Other | Admitting: Anesthesiology

## 2022-05-05 ENCOUNTER — Encounter (HOSPITAL_COMMUNITY)
Admission: RE | Disposition: A | Discharge status: Skilled Nursing Facility | Payer: Self-pay | Source: Home / Self Care | Attending: Orthopedic Surgery

## 2022-05-05 DIAGNOSIS — G4733 Obstructive sleep apnea (adult) (pediatric): Secondary | ICD-10-CM | POA: Diagnosis not present

## 2022-05-05 DIAGNOSIS — E291 Testicular hypofunction: Secondary | ICD-10-CM | POA: Diagnosis present

## 2022-05-05 DIAGNOSIS — M6281 Muscle weakness (generalized): Secondary | ICD-10-CM | POA: Diagnosis not present

## 2022-05-05 DIAGNOSIS — I5032 Chronic diastolic (congestive) heart failure: Secondary | ICD-10-CM | POA: Diagnosis present

## 2022-05-05 DIAGNOSIS — F411 Generalized anxiety disorder: Secondary | ICD-10-CM | POA: Diagnosis present

## 2022-05-05 DIAGNOSIS — Z7401 Bed confinement status: Secondary | ICD-10-CM | POA: Diagnosis not present

## 2022-05-05 DIAGNOSIS — Z6836 Body mass index (BMI) 36.0-36.9, adult: Secondary | ICD-10-CM

## 2022-05-05 DIAGNOSIS — Z86010 Personal history of colonic polyps: Secondary | ICD-10-CM

## 2022-05-05 DIAGNOSIS — E669 Obesity, unspecified: Secondary | ICD-10-CM | POA: Diagnosis present

## 2022-05-05 DIAGNOSIS — J4530 Mild persistent asthma, uncomplicated: Secondary | ICD-10-CM | POA: Diagnosis present

## 2022-05-05 DIAGNOSIS — Z452 Encounter for adjustment and management of vascular access device: Secondary | ICD-10-CM | POA: Diagnosis not present

## 2022-05-05 DIAGNOSIS — M86171 Other acute osteomyelitis, right ankle and foot: Secondary | ICD-10-CM | POA: Diagnosis not present

## 2022-05-05 DIAGNOSIS — Z741 Need for assistance with personal care: Secondary | ICD-10-CM | POA: Diagnosis not present

## 2022-05-05 DIAGNOSIS — I83018 Varicose veins of right lower extremity with ulcer other part of lower leg: Secondary | ICD-10-CM | POA: Diagnosis present

## 2022-05-05 DIAGNOSIS — N1831 Chronic kidney disease, stage 3a: Secondary | ICD-10-CM | POA: Diagnosis not present

## 2022-05-05 DIAGNOSIS — Z96652 Presence of left artificial knee joint: Secondary | ICD-10-CM | POA: Diagnosis present

## 2022-05-05 DIAGNOSIS — Z8249 Family history of ischemic heart disease and other diseases of the circulatory system: Secondary | ICD-10-CM

## 2022-05-05 DIAGNOSIS — Z9841 Cataract extraction status, right eye: Secondary | ICD-10-CM | POA: Diagnosis not present

## 2022-05-05 DIAGNOSIS — G8929 Other chronic pain: Secondary | ICD-10-CM | POA: Diagnosis present

## 2022-05-05 DIAGNOSIS — F32A Depression, unspecified: Secondary | ICD-10-CM | POA: Diagnosis present

## 2022-05-05 DIAGNOSIS — M879 Osteonecrosis, unspecified: Secondary | ICD-10-CM | POA: Diagnosis present

## 2022-05-05 DIAGNOSIS — Z8349 Family history of other endocrine, nutritional and metabolic diseases: Secondary | ICD-10-CM

## 2022-05-05 DIAGNOSIS — I5030 Unspecified diastolic (congestive) heart failure: Secondary | ICD-10-CM | POA: Diagnosis not present

## 2022-05-05 DIAGNOSIS — R1314 Dysphagia, pharyngoesophageal phase: Secondary | ICD-10-CM | POA: Diagnosis not present

## 2022-05-05 DIAGNOSIS — R41841 Cognitive communication deficit: Secondary | ICD-10-CM | POA: Diagnosis not present

## 2022-05-05 DIAGNOSIS — Z89022 Acquired absence of left finger(s): Secondary | ICD-10-CM | POA: Diagnosis not present

## 2022-05-05 DIAGNOSIS — F5101 Primary insomnia: Secondary | ICD-10-CM | POA: Diagnosis present

## 2022-05-05 DIAGNOSIS — N401 Enlarged prostate with lower urinary tract symptoms: Secondary | ICD-10-CM | POA: Diagnosis present

## 2022-05-05 DIAGNOSIS — E785 Hyperlipidemia, unspecified: Secondary | ICD-10-CM | POA: Diagnosis present

## 2022-05-05 DIAGNOSIS — I110 Hypertensive heart disease with heart failure: Secondary | ICD-10-CM

## 2022-05-05 DIAGNOSIS — I130 Hypertensive heart and chronic kidney disease with heart failure and stage 1 through stage 4 chronic kidney disease, or unspecified chronic kidney disease: Secondary | ICD-10-CM | POA: Diagnosis present

## 2022-05-05 DIAGNOSIS — M19012 Primary osteoarthritis, left shoulder: Secondary | ICD-10-CM | POA: Diagnosis not present

## 2022-05-05 DIAGNOSIS — Z89421 Acquired absence of other right toe(s): Secondary | ICD-10-CM

## 2022-05-05 DIAGNOSIS — I50812 Chronic right heart failure: Secondary | ICD-10-CM | POA: Diagnosis present

## 2022-05-05 DIAGNOSIS — Z8701 Personal history of pneumonia (recurrent): Secondary | ICD-10-CM | POA: Diagnosis not present

## 2022-05-05 DIAGNOSIS — I872 Venous insufficiency (chronic) (peripheral): Secondary | ICD-10-CM | POA: Diagnosis not present

## 2022-05-05 DIAGNOSIS — M1612 Unilateral primary osteoarthritis, left hip: Secondary | ICD-10-CM | POA: Diagnosis not present

## 2022-05-05 DIAGNOSIS — L03115 Cellulitis of right lower limb: Secondary | ICD-10-CM | POA: Diagnosis present

## 2022-05-05 DIAGNOSIS — R7303 Prediabetes: Secondary | ICD-10-CM | POA: Diagnosis present

## 2022-05-05 DIAGNOSIS — L97819 Non-pressure chronic ulcer of other part of right lower leg with unspecified severity: Secondary | ICD-10-CM | POA: Diagnosis present

## 2022-05-05 DIAGNOSIS — M2550 Pain in unspecified joint: Secondary | ICD-10-CM | POA: Diagnosis not present

## 2022-05-05 DIAGNOSIS — Z9842 Cataract extraction status, left eye: Secondary | ICD-10-CM | POA: Diagnosis not present

## 2022-05-05 DIAGNOSIS — R2689 Other abnormalities of gait and mobility: Secondary | ICD-10-CM | POA: Diagnosis not present

## 2022-05-05 DIAGNOSIS — S72052A Unspecified fracture of head of left femur, initial encounter for closed fracture: Secondary | ICD-10-CM

## 2022-05-05 DIAGNOSIS — Z89411 Acquired absence of right great toe: Secondary | ICD-10-CM

## 2022-05-05 DIAGNOSIS — Z96642 Presence of left artificial hip joint: Secondary | ICD-10-CM | POA: Diagnosis not present

## 2022-05-05 DIAGNOSIS — K219 Gastro-esophageal reflux disease without esophagitis: Secondary | ICD-10-CM | POA: Diagnosis present

## 2022-05-05 DIAGNOSIS — M84652A Pathological fracture in other disease, left femur, initial encounter for fracture: Secondary | ICD-10-CM | POA: Diagnosis present

## 2022-05-05 DIAGNOSIS — Z833 Family history of diabetes mellitus: Secondary | ICD-10-CM

## 2022-05-05 DIAGNOSIS — I509 Heart failure, unspecified: Secondary | ICD-10-CM | POA: Diagnosis not present

## 2022-05-05 DIAGNOSIS — R262 Difficulty in walking, not elsewhere classified: Secondary | ICD-10-CM | POA: Diagnosis not present

## 2022-05-05 DIAGNOSIS — F418 Other specified anxiety disorders: Secondary | ICD-10-CM | POA: Diagnosis not present

## 2022-05-05 DIAGNOSIS — Z471 Aftercare following joint replacement surgery: Secondary | ICD-10-CM | POA: Diagnosis not present

## 2022-05-05 DIAGNOSIS — M6259 Muscle wasting and atrophy, not elsewhere classified, multiple sites: Secondary | ICD-10-CM | POA: Diagnosis not present

## 2022-05-05 DIAGNOSIS — Z85828 Personal history of other malignant neoplasm of skin: Secondary | ICD-10-CM

## 2022-05-05 HISTORY — PX: TOTAL HIP ARTHROPLASTY: SHX124

## 2022-05-05 LAB — BASIC METABOLIC PANEL
Anion gap: 10 (ref 5–15)
BUN: 30 mg/dL — ABNORMAL HIGH (ref 8–23)
CO2: 27 mmol/L (ref 22–32)
Calcium: 9.2 mg/dL (ref 8.9–10.3)
Chloride: 104 mmol/L (ref 98–111)
Creatinine, Ser: 1.57 mg/dL — ABNORMAL HIGH (ref 0.61–1.24)
GFR, Estimated: 44 mL/min — ABNORMAL LOW (ref >60)
Glucose, Bld: 99 mg/dL (ref 70–99)
Potassium: 3.5 mmol/L (ref 3.5–5.1)
Sodium: 141 mmol/L (ref 135–145)

## 2022-05-05 LAB — TYPE AND SCREEN
ABO/RH(D): O POS
Antibody Screen: NEGATIVE

## 2022-05-05 LAB — CBC
HCT: 42.8 % (ref 39.0–52.0)
Hemoglobin: 14.3 g/dL (ref 13.0–17.0)
MCH: 31.4 pg (ref 26.0–34.0)
MCHC: 33.4 g/dL (ref 30.0–36.0)
MCV: 94.1 fL (ref 80.0–100.0)
Platelets: 184 10*3/uL (ref 150–400)
RBC: 4.55 MIL/uL (ref 4.22–5.81)
RDW: 12.9 % (ref 11.5–15.5)
WBC: 8 10*3/uL (ref 4.0–10.5)
nRBC: 0 % (ref 0.0–0.2)

## 2022-05-05 SURGERY — ARTHROPLASTY, HIP, TOTAL, ANTERIOR APPROACH
Anesthesia: Monitor Anesthesia Care | Site: Hip | Laterality: Left

## 2022-05-05 MED ORDER — AMISULPRIDE (ANTIEMETIC) 5 MG/2ML IV SOLN
10.0000 mg | Freq: Once | INTRAVENOUS | Status: DC | PRN
Start: 2022-05-05 — End: 2022-05-05

## 2022-05-05 MED ORDER — BUPROPION HCL ER (XL) 300 MG PO TB24
450.0000 mg | ORAL_TABLET | Freq: Every morning | ORAL | Status: DC
  Administered 2022-05-06 – 2022-05-09 (×4): 450 mg via ORAL
  Filled 2022-05-05 (×4): qty 1

## 2022-05-05 MED ORDER — BUPIVACAINE LIPOSOME 1.3 % IJ SUSP: 10.0000 mL | Freq: Once | INTRAMUSCULAR | Status: DC

## 2022-05-05 MED ORDER — HYDROCODONE-ACETAMINOPHEN 5-325 MG PO TABS
1.0000 | ORAL_TABLET | Freq: Four times a day (QID) | ORAL | Status: DC | PRN
  Administered 2022-05-05 – 2022-05-06 (×3): 2 via ORAL
  Filled 2022-05-05 (×3): qty 2

## 2022-05-05 MED ORDER — ORAL CARE MOUTH RINSE: 15.0000 mL | Freq: Once | OROMUCOSAL | Status: AC

## 2022-05-05 MED ORDER — DOXAZOSIN MESYLATE 8 MG PO TABS
8.0000 mg | ORAL_TABLET | Freq: Every day | ORAL | Status: DC
  Administered 2022-05-05 – 2022-05-08 (×4): 8 mg via ORAL
  Filled 2022-05-05 (×4): qty 1

## 2022-05-05 MED ORDER — TRANEXAMIC ACID-NACL 1000-0.7 MG/100ML-% IV SOLN
1000.0000 mg | INTRAVENOUS | Status: AC
  Administered 2022-05-05: 1000 mg via INTRAVENOUS
  Filled 2022-05-05: qty 100

## 2022-05-05 MED ORDER — ONDANSETRON HCL 4 MG/2ML IJ SOLN
4.0000 mg | Freq: Four times a day (QID) | INTRAMUSCULAR | Status: DC | PRN
  Administered 2022-05-05: 4 mg via INTRAVENOUS
  Filled 2022-05-05: qty 2

## 2022-05-05 MED ORDER — SODIUM CHLORIDE 0.9 % IV SOLN
INTRAVENOUS | Status: DC
Start: 2022-05-05 — End: 2022-05-09

## 2022-05-05 MED ORDER — PANTOPRAZOLE SODIUM 40 MG PO TBEC
40.0000 mg | DELAYED_RELEASE_TABLET | Freq: Every day | ORAL | Status: DC
  Administered 2022-05-06 – 2022-05-09 (×4): 40 mg via ORAL
  Filled 2022-05-05 (×4): qty 1

## 2022-05-05 MED ORDER — PROPOFOL 10 MG/ML IV BOLUS
INTRAVENOUS | Status: AC
Start: 2022-05-05 — End: ?
  Filled 2022-05-05: qty 20

## 2022-05-05 MED ORDER — METHOCARBAMOL 500 MG IVPB - SIMPLE MED: 500.0000 mg | Freq: Four times a day (QID) | INTRAVENOUS | Status: DC | PRN

## 2022-05-05 MED ORDER — MIDAZOLAM HCL 2 MG/2ML IJ SOLN
INTRAMUSCULAR | Status: AC
Start: 2022-05-05 — End: ?
  Filled 2022-05-05: qty 2

## 2022-05-05 MED ORDER — ALUM & MAG HYDROXIDE-SIMETH 200-200-20 MG/5ML PO SUSP
30.0000 mL | Freq: Four times a day (QID) | ORAL | Status: DC | PRN
  Administered 2022-05-05: 30 mL via ORAL
  Filled 2022-05-05: qty 30

## 2022-05-05 MED ORDER — FENTANYL CITRATE PF 50 MCG/ML IJ SOSY
PREFILLED_SYRINGE | INTRAMUSCULAR | Status: AC
  Filled 2022-05-05: qty 1

## 2022-05-05 MED ORDER — ONDANSETRON HCL 4 MG/2ML IJ SOLN
INTRAMUSCULAR | Status: AC
  Filled 2022-05-05: qty 4

## 2022-05-05 MED ORDER — LIDOCAINE 2% (20 MG/ML) 5 ML SYRINGE
INTRAMUSCULAR | Status: DC | PRN
  Administered 2022-05-05: 40 mg via INTRAVENOUS

## 2022-05-05 MED ORDER — MORPHINE SULFATE (PF) 2 MG/ML IV SOLN: 0.5000 mg | INTRAVENOUS | Status: DC | PRN

## 2022-05-05 MED ORDER — TRANEXAMIC ACID-NACL 1000-0.7 MG/100ML-% IV SOLN
1000.0000 mg | Freq: Once | INTRAVENOUS | Status: AC
  Administered 2022-05-05: 1000 mg via INTRAVENOUS
  Filled 2022-05-05: qty 100

## 2022-05-05 MED ORDER — PHENYLEPHRINE HCL (PRESSORS) 10 MG/ML IV SOLN
INTRAVENOUS | Status: AC
  Filled 2022-05-05: qty 1

## 2022-05-05 MED ORDER — DEXAMETHASONE SODIUM PHOSPHATE 10 MG/ML IJ SOLN
10.0000 mg | Freq: Two times a day (BID) | INTRAMUSCULAR | Status: AC
  Administered 2022-05-05 (×2): 10 mg via INTRAVENOUS
  Filled 2022-05-05 (×2): qty 1

## 2022-05-05 MED ORDER — FENTANYL CITRATE (PF) 250 MCG/5ML IJ SOLN
INTRAMUSCULAR | Status: DC | PRN
Start: 2022-05-05 — End: 2022-05-05
  Administered 2022-05-05 (×2): 50 ug via INTRAVENOUS
  Administered 2022-05-05: 25 ug via INTRAVENOUS
  Administered 2022-05-05: 50 ug via INTRAVENOUS
  Administered 2022-05-05: 25 ug via INTRAVENOUS
  Administered 2022-05-05: 50 ug via INTRAVENOUS
  Administered 2022-05-05: 25 ug via INTRAVENOUS
  Administered 2022-05-05: 50 ug via INTRAVENOUS
  Administered 2022-05-05: 25 ug via INTRAVENOUS

## 2022-05-05 MED ORDER — PHENYLEPHRINE HCL-NACL 20-0.9 MG/250ML-% IV SOLN
INTRAVENOUS | Status: DC | PRN
  Administered 2022-05-05 (×2): 25 ug/min via INTRAVENOUS

## 2022-05-05 MED ORDER — BUPIVACAINE LIPOSOME 1.3 % IJ SUSP
INTRAMUSCULAR | Status: AC
  Filled 2022-05-05: qty 10

## 2022-05-05 MED ORDER — CEFAZOLIN SODIUM-DEXTROSE 2-4 GM/100ML-% IV SOLN
2.0000 g | INTRAVENOUS | Status: AC
  Administered 2022-05-05: 2 g via INTRAVENOUS
  Filled 2022-05-05: qty 100

## 2022-05-05 MED ORDER — DOXYCYCLINE HYCLATE 100 MG PO TABS
100.0000 mg | ORAL_TABLET | Freq: Two times a day (BID) | ORAL | Status: DC
  Administered 2022-05-07 – 2022-05-09 (×5): 100 mg via ORAL
  Filled 2022-05-05 (×5): qty 1

## 2022-05-05 MED ORDER — POTASSIUM CHLORIDE CRYS ER 20 MEQ PO TBCR
20.0000 meq | EXTENDED_RELEASE_TABLET | Freq: Two times a day (BID) | ORAL | Status: DC
  Administered 2022-05-05 – 2022-05-09 (×9): 20 meq via ORAL
  Filled 2022-05-05 (×9): qty 1

## 2022-05-05 MED ORDER — MONTELUKAST SODIUM 10 MG PO TABS
10.0000 mg | ORAL_TABLET | Freq: Every day | ORAL | Status: DC
  Administered 2022-05-05 – 2022-05-08 (×4): 10 mg via ORAL
  Filled 2022-05-05 (×4): qty 1

## 2022-05-05 MED ORDER — EPHEDRINE 5 MG/ML INJ
INTRAVENOUS | Status: AC
  Filled 2022-05-05: qty 10

## 2022-05-05 MED ORDER — PROPOFOL 1000 MG/100ML IV EMUL
INTRAVENOUS | Status: AC
  Filled 2022-05-05: qty 100

## 2022-05-05 MED ORDER — SUGAMMADEX SODIUM 500 MG/5ML IV SOLN
INTRAVENOUS | Status: DC | PRN
  Administered 2022-05-05: 400 mg via INTRAVENOUS

## 2022-05-05 MED ORDER — ACETAMINOPHEN 500 MG PO TABS
1000.0000 mg | ORAL_TABLET | Freq: Once | ORAL | Status: AC
  Administered 2022-05-05: 1000 mg via ORAL
  Filled 2022-05-05: qty 2

## 2022-05-05 MED ORDER — ALBUMIN HUMAN 5 % IV SOLN: INTRAVENOUS | Status: DC | PRN

## 2022-05-05 MED ORDER — ASPIRIN 325 MG PO TBEC
325.0000 mg | DELAYED_RELEASE_TABLET | Freq: Two times a day (BID) | ORAL | Status: DC
  Administered 2022-05-05 – 2022-05-09 (×8): 325 mg via ORAL
  Filled 2022-05-05 (×8): qty 1

## 2022-05-05 MED ORDER — DEXAMETHASONE SODIUM PHOSPHATE 10 MG/ML IJ SOLN
INTRAMUSCULAR | Status: AC
  Filled 2022-05-05: qty 1

## 2022-05-05 MED ORDER — GABAPENTIN 300 MG PO CAPS
600.0000 mg | ORAL_CAPSULE | Freq: Every day | ORAL | Status: DC
  Administered 2022-05-05 – 2022-05-08 (×4): 600 mg via ORAL
  Filled 2022-05-05 (×4): qty 2

## 2022-05-05 MED ORDER — SUGAMMADEX SODIUM 500 MG/5ML IV SOLN
INTRAVENOUS | Status: AC
  Filled 2022-05-05: qty 5

## 2022-05-05 MED ORDER — FENTANYL CITRATE PF 50 MCG/ML IJ SOSY
PREFILLED_SYRINGE | INTRAMUSCULAR | Status: AC
  Administered 2022-05-05: 50 ug via INTRAVENOUS
  Filled 2022-05-05: qty 2

## 2022-05-05 MED ORDER — SUCCINYLCHOLINE CHLORIDE 200 MG/10ML IV SOSY
PREFILLED_SYRINGE | INTRAVENOUS | Status: DC | PRN
  Administered 2022-05-05: 180 mg via INTRAVENOUS

## 2022-05-05 MED ORDER — ALBUTEROL SULFATE (2.5 MG/3ML) 0.083% IN NEBU
2.5000 mg | INHALATION_SOLUTION | Freq: Four times a day (QID) | RESPIRATORY_TRACT | Status: DC | PRN
Start: 2022-05-05 — End: 2022-05-09
  Administered 2022-05-08: 2.5 mg via RESPIRATORY_TRACT
  Filled 2022-05-05: qty 3

## 2022-05-05 MED ORDER — LIDOCAINE HCL (PF) 2 % IJ SOLN
INTRAMUSCULAR | Status: AC
  Filled 2022-05-05: qty 20

## 2022-05-05 MED ORDER — PHENYLEPHRINE 80 MCG/ML (10ML) SYRINGE FOR IV PUSH (FOR BLOOD PRESSURE SUPPORT)
PREFILLED_SYRINGE | INTRAVENOUS | Status: DC | PRN
  Administered 2022-05-05: 160 ug via INTRAVENOUS

## 2022-05-05 MED ORDER — ROCURONIUM BROMIDE 10 MG/ML (PF) SYRINGE
PREFILLED_SYRINGE | INTRAVENOUS | Status: AC
  Filled 2022-05-05: qty 20

## 2022-05-05 MED ORDER — DOCUSATE SODIUM 100 MG PO CAPS: 100.0000 mg | ORAL_CAPSULE | Freq: Two times a day (BID) | ORAL | Status: DC

## 2022-05-05 MED ORDER — FENTANYL CITRATE PF 50 MCG/ML IJ SOSY: 25.0000 ug | PREFILLED_SYRINGE | INTRAMUSCULAR | Status: DC | PRN

## 2022-05-05 MED ORDER — EPHEDRINE SULFATE-NACL 50-0.9 MG/10ML-% IV SOSY
PREFILLED_SYRINGE | INTRAVENOUS | Status: DC | PRN
  Administered 2022-05-05: 10 mg via INTRAVENOUS

## 2022-05-05 MED ORDER — POLYETHYLENE GLYCOL 3350 17 G PO PACK: 17.0000 g | PACK | Freq: Every day | ORAL | Status: DC | PRN

## 2022-05-05 MED ORDER — BUPIVACAINE-EPINEPHRINE 0.5% -1:200000 IJ SOLN
INTRAMUSCULAR | Status: AC
  Filled 2022-05-05: qty 1

## 2022-05-05 MED ORDER — PHENYLEPHRINE 80 MCG/ML (10ML) SYRINGE FOR IV PUSH (FOR BLOOD PRESSURE SUPPORT)
PREFILLED_SYRINGE | INTRAVENOUS | Status: AC
  Filled 2022-05-05: qty 20

## 2022-05-05 MED ORDER — ALBUMIN HUMAN 5 % IV SOLN
INTRAVENOUS | Status: AC
  Filled 2022-05-05: qty 250

## 2022-05-05 MED ORDER — SODIUM CHLORIDE 0.9 % IR SOLN
Status: DC | PRN
  Administered 2022-05-05 (×2): 1000 mL

## 2022-05-05 MED ORDER — WATER FOR IRRIGATION, STERILE IR SOLN
Status: DC | PRN
  Administered 2022-05-05: 2000 mL

## 2022-05-05 MED ORDER — METHOCARBAMOL 500 MG PO TABS: 500.0000 mg | ORAL_TABLET | Freq: Three times a day (TID) | ORAL | Status: DC | PRN

## 2022-05-05 MED ORDER — OXYCODONE HCL 5 MG PO TABS: 5.0000 mg | ORAL_TABLET | Freq: Once | ORAL | Status: DC | PRN

## 2022-05-05 MED ORDER — AMOXICILLIN-POT CLAVULANATE 875-125 MG PO TABS
1.0000 | ORAL_TABLET | Freq: Two times a day (BID) | ORAL | Status: DC
  Administered 2022-05-07 – 2022-05-09 (×5): 1 via ORAL
  Filled 2022-05-05 (×5): qty 1

## 2022-05-05 MED ORDER — CEFTRIAXONE IV (FOR PTA / DISCHARGE USE ONLY): 2.0000 g | INTRAVENOUS | Status: DC

## 2022-05-05 MED ORDER — FENTANYL CITRATE (PF) 250 MCG/5ML IJ SOLN
INTRAMUSCULAR | Status: AC
  Filled 2022-05-05: qty 5

## 2022-05-05 MED ORDER — LACTATED RINGERS IV SOLN: INTRAVENOUS | Status: DC

## 2022-05-05 MED ORDER — CALCIUM CARBONATE ANTACID 500 MG PO CHEW
2.0000 | CHEWABLE_TABLET | Freq: Four times a day (QID) | ORAL | Status: DC | PRN
  Administered 2022-05-05 – 2022-05-09 (×5): 400 mg via ORAL
  Filled 2022-05-05 (×6): qty 2

## 2022-05-05 MED ORDER — BUPIVACAINE-EPINEPHRINE (PF) 0.5% -1:200000 IJ SOLN
INTRAMUSCULAR | Status: DC | PRN
  Administered 2022-05-05: 30 mL

## 2022-05-05 MED ORDER — ACETAMINOPHEN 325 MG PO TABS: 325.0000 mg | ORAL_TABLET | Freq: Four times a day (QID) | ORAL | Status: DC | PRN

## 2022-05-05 MED ORDER — PROPOFOL 10 MG/ML IV BOLUS
INTRAVENOUS | Status: DC | PRN
  Administered 2022-05-05 (×2): 20 mg via INTRAVENOUS
  Administered 2022-05-05: 150 mg via INTRAVENOUS
  Administered 2022-05-05: 20 mg via INTRAVENOUS

## 2022-05-05 MED ORDER — MIDAZOLAM HCL 2 MG/2ML IJ SOLN
INTRAMUSCULAR | Status: DC | PRN
  Administered 2022-05-05 (×4): .5 mg via INTRAVENOUS

## 2022-05-05 MED ORDER — MOMETASONE FURO-FORMOTEROL FUM 200-5 MCG/ACT IN AERO
2.0000 | INHALATION_SPRAY | Freq: Two times a day (BID) | RESPIRATORY_TRACT | Status: DC
  Administered 2022-05-05 – 2022-05-09 (×9): 2 via RESPIRATORY_TRACT
  Filled 2022-05-05: qty 8.8

## 2022-05-05 MED ORDER — SODIUM CHLORIDE 0.9 % IV SOLN
2.0000 g | INTRAVENOUS | Status: AC
  Administered 2022-05-05 – 2022-05-06 (×2): 2 g via INTRAVENOUS
  Filled 2022-05-05 (×2): qty 20

## 2022-05-05 MED ORDER — MIRTAZAPINE 15 MG PO TABS
15.0000 mg | ORAL_TABLET | Freq: Every day | ORAL | Status: DC
  Administered 2022-05-05 – 2022-05-08 (×4): 15 mg via ORAL
  Filled 2022-05-05 (×4): qty 1

## 2022-05-05 MED ORDER — ADULT MULTIVITAMIN W/MINERALS CH
1.0000 | ORAL_TABLET | Freq: Every morning | ORAL | Status: DC
  Administered 2022-05-05 – 2022-05-09 (×5): 1 via ORAL
  Filled 2022-05-05 (×5): qty 1

## 2022-05-05 MED ORDER — CHLORHEXIDINE GLUCONATE CLOTH 2 % EX PADS
6.0000 | MEDICATED_PAD | Freq: Every day | CUTANEOUS | Status: DC
  Administered 2022-05-06: 6 via TOPICAL

## 2022-05-05 MED ORDER — ONDANSETRON HCL 4 MG/2ML IJ SOLN: 4.0000 mg | Freq: Once | INTRAMUSCULAR | Status: DC | PRN

## 2022-05-05 MED ORDER — ONDANSETRON HCL 4 MG PO TABS
4.0000 mg | ORAL_TABLET | Freq: Four times a day (QID) | ORAL | Status: DC | PRN
  Administered 2022-05-08: 4 mg via ORAL
  Filled 2022-05-05: qty 1

## 2022-05-05 MED ORDER — VANCOMYCIN IV (FOR PTA / DISCHARGE USE ONLY): 1000.0000 mg | INTRAVENOUS | Status: DC

## 2022-05-05 MED ORDER — BUPIVACAINE HCL 0.25 % IJ SOLN
INTRAMUSCULAR | Status: AC
  Filled 2022-05-05: qty 1

## 2022-05-05 MED ORDER — ROCURONIUM BROMIDE 10 MG/ML (PF) SYRINGE
PREFILLED_SYRINGE | INTRAVENOUS | Status: DC | PRN
  Administered 2022-05-05: 20 mg via INTRAVENOUS
  Administered 2022-05-05: 50 mg via INTRAVENOUS

## 2022-05-05 MED ORDER — ONDANSETRON HCL 4 MG/2ML IJ SOLN
INTRAMUSCULAR | Status: DC | PRN
  Administered 2022-05-05: 4 mg via INTRAVENOUS

## 2022-05-05 MED ORDER — METHOCARBAMOL 500 MG PO TABS
500.0000 mg | ORAL_TABLET | Freq: Four times a day (QID) | ORAL | Status: DC | PRN
  Administered 2022-05-05 – 2022-05-08 (×11): 500 mg via ORAL
  Filled 2022-05-05 (×13): qty 1

## 2022-05-05 MED ORDER — ROSUVASTATIN CALCIUM 5 MG PO TABS
5.0000 mg | ORAL_TABLET | Freq: Every morning | ORAL | Status: DC
  Administered 2022-05-05 – 2022-05-09 (×5): 5 mg via ORAL
  Filled 2022-05-05 (×4): qty 1

## 2022-05-05 MED ORDER — DOCUSATE SODIUM 100 MG PO CAPS
100.0000 mg | ORAL_CAPSULE | Freq: Two times a day (BID) | ORAL | Status: DC
  Administered 2022-05-05 – 2022-05-09 (×8): 100 mg via ORAL
  Filled 2022-05-05 (×8): qty 1

## 2022-05-05 MED ORDER — MENTHOL 3 MG MT LOZG
1.0000 | LOZENGE | OROMUCOSAL | Status: DC | PRN
Start: 2022-05-05 — End: 2022-05-06
  Administered 2022-05-05: 3 mg via ORAL
  Filled 2022-05-05: qty 9

## 2022-05-05 MED ORDER — POVIDONE-IODINE 10 % EX SWAB: 2.0000 | Freq: Once | CUTANEOUS | Status: DC

## 2022-05-05 MED ORDER — SUCCINYLCHOLINE CHLORIDE 200 MG/10ML IV SOSY
PREFILLED_SYRINGE | INTRAVENOUS | Status: AC
  Filled 2022-05-05: qty 20

## 2022-05-05 MED ORDER — OXYCODONE HCL 5 MG/5ML PO SOLN: 5.0000 mg | Freq: Once | ORAL | Status: DC | PRN

## 2022-05-05 MED ORDER — FUROSEMIDE 40 MG PO TABS
40.0000 mg | ORAL_TABLET | Freq: Every day | ORAL | Status: DC
Start: 2022-05-05 — End: 2022-05-09
  Administered 2022-05-05 – 2022-05-09 (×5): 40 mg via ORAL
  Filled 2022-05-05 (×5): qty 1

## 2022-05-05 MED ORDER — BISACODYL 5 MG PO TBEC
5.0000 mg | DELAYED_RELEASE_TABLET | Freq: Every day | ORAL | Status: DC | PRN
Start: 2022-05-05 — End: 2022-05-09

## 2022-05-05 MED ORDER — CHLORHEXIDINE GLUCONATE 0.12 % MT SOLN
15.0000 mL | Freq: Once | OROMUCOSAL | Status: AC
  Administered 2022-05-05: 15 mL via OROMUCOSAL

## 2022-05-05 MED ORDER — VANCOMYCIN HCL IN DEXTROSE 1-5 GM/200ML-% IV SOLN
1000.0000 mg | INTRAVENOUS | Status: AC
  Administered 2022-05-05 – 2022-05-06 (×2): 1000 mg via INTRAVENOUS
  Filled 2022-05-05 (×2): qty 200

## 2022-05-05 SURGICAL SUPPLY — 53 items
APL SKNCLS STERI-STRIP NONHPOA (GAUZE/BANDAGES/DRESSINGS)
BAG COUNTER SPONGE SURGICOUNT (BAG) IMPLANT
BAG SPEC THK2 15X12 ZIP CLS (MISCELLANEOUS) ×1
BAG SPNG CNTER NS LX DISP (BAG)
BAG ZIPLOCK 12X15 (MISCELLANEOUS) ×1 IMPLANT
BALL HIP ARTICU EZE 36 8.5 (Hips) IMPLANT
BENZOIN TINCTURE PRP APPL 2/3 (GAUZE/BANDAGES/DRESSINGS) IMPLANT
BLADE SAW SGTL 18X1.27X75 (BLADE) ×2 IMPLANT
BLADE SURG SZ10 CARB STEEL (BLADE) ×4 IMPLANT
BNDG ELASTIC 3X5.8 VLCR STR LF (GAUZE/BANDAGES/DRESSINGS) ×2 IMPLANT
BNDG ELASTIC 4X5.8 VLCR STR LF (GAUZE/BANDAGES/DRESSINGS) ×2 IMPLANT
COVER PERINEAL POST (MISCELLANEOUS) ×2 IMPLANT
COVER SURGICAL LIGHT HANDLE (MISCELLANEOUS) ×2 IMPLANT
CUP PINN GRIPTON 54 100 (Cup) ×1 IMPLANT
DRAPE FOOT SWITCH (DRAPES) ×2 IMPLANT
DRAPE STERI IOBAN 125X83 (DRAPES) ×2 IMPLANT
DRAPE U-SHAPE 47X51 STRL (DRAPES) ×4 IMPLANT
DRSG AQUACEL AG ADV 3.5X 6 (GAUZE/BANDAGES/DRESSINGS) ×2 IMPLANT
DRSG AQUACEL AG ADV 3.5X10 (GAUZE/BANDAGES/DRESSINGS) ×1 IMPLANT
DURAPREP 26ML APPLICATOR (WOUND CARE) ×2 IMPLANT
ELECT BLADE TIP CTD 4 INCH (ELECTRODE) ×2 IMPLANT
ELECT REM PT RETURN 15FT ADLT (MISCELLANEOUS) ×2 IMPLANT
ELIMINATOR HOLE APEX DEPUY (Hips) ×1 IMPLANT
GAUZE SPONGE 4X4 12PLY STRL (GAUZE/BANDAGES/DRESSINGS) ×1 IMPLANT
GAUZE XEROFORM 1X8 LF (GAUZE/BANDAGES/DRESSINGS) ×1 IMPLANT
GLOVE BIOGEL PI IND STRL 8 (GLOVE) ×2 IMPLANT
GLOVE BIOGEL PI INDICATOR 8 (GLOVE) ×2
GLOVE ECLIPSE 7.5 STRL STRAW (GLOVE) ×6 IMPLANT
GOWN STRL REUS W/ TWL XL LVL3 (GOWN DISPOSABLE) ×2 IMPLANT
GOWN STRL REUS W/TWL XL LVL3 (GOWN DISPOSABLE) ×6
HIP BALL ARTICU EZE 36 8.5 (Hips) ×2 IMPLANT
HOLDER FOLEY CATH W/STRAP (MISCELLANEOUS) ×2 IMPLANT
HOOD PEEL AWAY FLYTE STAYCOOL (MISCELLANEOUS) ×5 IMPLANT
KIT TURNOVER KIT A (KITS) ×1 IMPLANT
LINER NEUTRAL 36ID 54OD (Liner) ×1 IMPLANT
NEEDLE HYPO 22GX1.5 SAFETY (NEEDLE) ×2 IMPLANT
PACK ANTERIOR HIP CUSTOM (KITS) ×2 IMPLANT
PADDING CAST SYN 6 (CAST SUPPLIES) ×3
PADDING CAST SYNTHETIC 4 (CAST SUPPLIES) ×3
PADDING CAST SYNTHETIC 4X4 STR (CAST SUPPLIES) IMPLANT
PADDING CAST SYNTHETIC 6X4 NS (CAST SUPPLIES) IMPLANT
SPIKE FLUID TRANSFER (MISCELLANEOUS) ×2 IMPLANT
STAPLER VISISTAT 35W (STAPLE) ×1 IMPLANT
STEM FEM ACTIS HIGH SZ7 (Stem) ×1 IMPLANT
STRIP CLOSURE SKIN 1/2X4 (GAUZE/BANDAGES/DRESSINGS) IMPLANT
SUT ETHIBOND NAB CT1 #1 30IN (SUTURE) ×4 IMPLANT
SUT MNCRL AB 3-0 PS2 18 (SUTURE) IMPLANT
SUT VIC AB 0 CT1 36 (SUTURE) ×3 IMPLANT
SUT VIC AB 1 CT1 36 (SUTURE) ×2 IMPLANT
SUT VIC AB 2-0 CT1 27 (SUTURE) ×2
SUT VIC AB 2-0 CT1 TAPERPNT 27 (SUTURE) ×1 IMPLANT
TRAY FOLEY MTR SLVR 16FR STAT (SET/KITS/TRAYS/PACK) ×2 IMPLANT
TUBE SUCTION HIGH CAP CLEAR NV (SUCTIONS) ×1 IMPLANT

## 2022-05-05 NOTE — Progress Notes (Signed)
The patient had an area of osteomyelitis documented by MRI in May 2022.  He had plain films in May 2023 suggesting that there was persistent osteomyelitis.  He was scheduled for total hip arthroplasty and it was felt that this nidus of infection that had been documented by MRI and plain x-ray needed to be addressed prior to any potential surgical intervention for the left hip.  While there could be gouty deposits superimposed, the clinical situation along with imaging documentation suggested that this was an active or potentially active infection.

## 2022-05-05 NOTE — Consult Note (Addendum)
[facility] for Infectious Disease    Date of Admission:  05/05/2022     Reason for Consult:  Antibiotic recommendations     Referring Physician: Dr Berenice Primas  Current antibiotics: Ceftriaxone Vancomycin   ASSESSMENT:    81 y.o. 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 - s/p amputation with Dr Berenice Primas on 6/9 for treatment of OM - completing ~ 2 weeks of IV antibiotics with vancomycin and ceftriaxone via PICC line on 6/27 and has had improvement with antibiotics along with compressive therapy to control edema  # Left hip degenerative OA - s/p THA with Dr Berenice Primas 6/26 with no findings of infection involving the joint at time of OR  # CKD 3a - creatinine is stable at 1.5 with CrCl 45     RECOMMENDATIONS:    Will continue with current IV antibiotics ceftriaxone and vancomycin through tomorrow as this will be the initial planned end date previously outlined. Starting on 6/28, will change to doxycycline '100mg'$  BID and augmentin '875mg'$  BID for 2 more weeks as a conservative approach to hopefully prevent any development of infection involving the left hip. Remove PICC line prior to discharge Wound care, lab monitoring Follow up scheduled with myself on 7/12 at 4pm Please call as needed but will sign off for now with the plan as noted above   Principal Problem:   Primary osteoarthritis of left hip   MEDICATIONS:    Scheduled Meds:  aspirin EC  325 mg Oral BID PC   [START ON 05/06/2022] buPROPion  450 mg Oral q morning   dexamethasone (DECADRON) injection  10 mg Intravenous Q12H   docusate sodium  100 mg Oral BID   doxazosin  8 mg Oral QHS   fentaNYL       furosemide  40 mg Oral Daily   gabapentin  600 mg Oral QHS   mirtazapine  15 mg Oral QHS   mometasone-formoterol  2 puff Inhalation BID   montelukast  10 mg Oral QHS   multivitamin with minerals  1 tablet Oral q morning   [START ON 05/06/2022] pantoprazole  40 mg Oral  Daily   potassium chloride SA  20 mEq Oral BID   rosuvastatin  5 mg Oral q morning   Continuous Infusions:  sodium chloride 100 mL/hr at 05/05/22 1244   cefTRIAXone (ROCEPHIN)  IV     methocarbamol (ROBAXIN) IV     vancomycin 1,000 mg (05/05/22 1409)   PRN Meds:.acetaminophen, albuterol, alum & mag hydroxide-simeth, bisacodyl, fentaNYL, HYDROcodone-acetaminophen, methocarbamol **OR** methocarbamol (ROBAXIN) IV, morphine injection, ondansetron **OR** ondansetron (ZOFRAN) IV, polyethylene glycol  HPI:    [name] is a 81 y.o. male with a PMHx as documented below.  Recently he was admitted to the hospital from 6/9 - 6/14 at which time it was intended that he undergo left hip replacement.  However, prior to his surgery, he was found to have concern for infection in his right lower extremity with a right great toe DFU/OM along with venous stasis dermatitis and ulceration with suspected cellulitis.  As a result, his left hip surgery was cancelled and he underwent amputation of his right great toe for definitive treatment of osteomyelitis.  He also had a PICC line placed and was discharged on ceftriaxone and vancomycin x 2 weeks for his ulcers and cellulitis.  He also continued compressive wrapping to help control his edema.  He has had improvement in these symptoms and  was able to undergo left hip arthroplasty today with Dr Berenice Primas.  During the surgery there was no findings of infection involving the left hip joint.  Of note, patient had an MRI of his hip on 6/12 noting a moderate sized effusion.  This was sampled and cultures were negative.  Patient reports that his right lower leg symptoms have improved and he is tolerating the antibiotics thus far via PICC line.  We are consulted for further antibiotic recommendations.     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   Hyperlipidemia    Joint pain    Neuromuscular disorder ([place])    Pre-diabetes    Prediabetes    Sleep apnea    uses CPAP nightly   SOB (shortness of breath)    Swallowing difficulty    Swelling of lower extremity    right more than leg leg   Umbilical hernia     Social History   Tobacco Use   Smoking status: Never   Smokeless tobacco: Never  Vaping Use   Vaping Use: Never used  Substance Use Topics   Alcohol use: Yes    Comment: rare   Drug use: No    Family History  Problem Relation Age of Onset   Thyroid disease Mother    Heart disease Father    Hypertension Father    Diabetes Maternal Grandfather    Colon cancer Neg Hx    Esophageal cancer Neg Hx     No Known Allergies  Review of Systems  Constitutional: Negative.   Respiratory: Negative.    Cardiovascular:  Positive for leg swelling.  Gastrointestinal: Negative.   Musculoskeletal:  Positive for joint pain.  All other systems reviewed and are negative.   OBJECTIVE:   Blood pressure (!) 155/67, pulse 77, temperature 98.2 F (36.8 C), temperature source Oral, resp. rate 18, height '5\' 9"'$  (1.753 m), weight 110.5 kg, SpO2 96 %. Body mass index is 35.97 kg/m.  Physical Exam Constitutional:      General: He is not in acute distress.    Appearance: Normal appearance.  HENT:     Head: Normocephalic and atraumatic.  Eyes:     Extraocular Movements: Extraocular movements intact.     Conjunctiva/sclera: Conjunctivae normal.  Pulmonary:     Effort: Pulmonary effort is normal. No  respiratory distress.  Abdominal:     General: There is no distension.     Palpations: Abdomen is soft.     Tenderness: There is no abdominal tenderness.  Musculoskeletal:     Cervical back: Normal range of motion and neck supple.     Right lower leg: Edema present.     Left lower leg: Edema present.     Comments: Compression socks on left leg. Ace  wrapping compression on the right leg.  Images of prior wounds reviewed on patients phone showing improvement in swelling and erythema.  No purulent drainage at present. PICC line in place.   Skin:    General: Skin is warm and dry.  Neurological:     General: No focal deficit present.     Mental Status: He is alert and oriented to person, place, and time.  Psychiatric:        Mood and Affect: Mood normal.        Behavior: Behavior normal.      Lab Results: Lab Results  Component Value Date   WBC 8.0 05/05/2022   HGB 14.3 05/05/2022   HCT 42.8 05/05/2022   MCV 94.1 05/05/2022   PLT 184 05/05/2022    Lab Results  Component Value Date   NA 141 05/05/2022   K 3.5 05/05/2022   CO2 27 05/05/2022   GLUCOSE 99 05/05/2022   BUN 30 (H) 05/05/2022   CREATININE 1.57 (H) 05/05/2022   CALCIUM 9.2 05/05/2022   GFRNONAA 44 (L) 05/05/2022   GFRAA 48 (L) 06/24/2016    Lab Results  Component Value Date   ALT 35 12/02/2021   AST 47 (H) 12/02/2021   ALKPHOS 103 12/02/2021   BILITOT 1.3 (H) 12/02/2021    No results found for: "CRP"     Component Value Date/Time   ESRSEDRATE 7 11/10/2015 1627    I have reviewed the micro and lab results in Epic.  Imaging: DG HIP UNILAT WITH PELVIS 1V LEFT  Result Date: 05/05/2022 CLINICAL DATA:  Fluoroscopic assistance for left hip arthroplasty EXAM: DG HIP (WITH OR WITHOUT PELVIS) 1V*L* COMPARISON:  MR done on 04/22/2019 FINDINGS: Fluoroscopic images show left hip arthroplasty. Fluoroscopic time was 5 seconds. Estimated radiation dose 8.41 mGy. IMPRESSION: Fluoroscopic assistance was provided for  left hip arthroplasty. Electronically Signed   By: Elmer Picker M.D.   On: 05/05/2022 09:56   DG C-Arm 1-60 Min-No Report  Result Date: 05/05/2022 Fluoroscopy was utilized by the requesting physician.  No radiographic interpretation.   DG C-Arm 1-60 Min-No Report  Result Date: 05/05/2022 Fluoroscopy was utilized by the requesting physician.  No radiographic interpretation.     Imaging independently reviewed in Epic.  [facility] [phone] pager 05/05/2022, 2:33 PM  I have personally spent 60 minutes involved in face-to-face and non-face-to-face activities for this patient on the day of the visit. Professional time spent includes the following activities: Preparing to see the patient (review of tests), Obtaining and/or reviewing separately obtained history (admission/discharge record), Performing a medically appropriate examination and/or evaluation , Ordering medications/tests/procedures, referring and communicating with other health care professionals, Documenting clinical information in the EMR, Independently interpreting results (not separately reported), Communicating results to the patient/family/caregiver, Counseling and educating the patient/family/caregiver and Care coordination (not separately reported).

## 2022-05-05 NOTE — Anesthesia Procedure Notes (Addendum)
Procedure Name: Intubation Date/Time: 05/05/2022 8:00 AM  Performed by: Minerva Ends, CRNAPre-anesthesia Checklist: Patient identified, Emergency Drugs available, Suction available and Patient being monitored Patient Re-evaluated:Patient Re-evaluated prior to induction Oxygen Delivery Method: Circle System Utilized Preoxygenation: Pre-oxygenation with 100% oxygen Induction Type: IV induction, Rapid sequence and Cricoid Pressure applied Ventilation: Mask ventilation without difficulty Grade View: Grade III Tube type: Oral Number of attempts: 1 Airway Equipment and Method: Stylet Placement Confirmation: ETT inserted through vocal cords under direct vision, positive ETCO2 and breath sounds checked- equal and bilateral Secured at: 23 cm Tube secured with: Tape Dental Injury: Teeth and Oropharynx as per pre-operative assessment  Difficulty Due To: Difficulty was anticipated, Difficult Airway- due to reduced neck mobility, Difficult Airway- due to limited oral opening and Difficult Airway- due to anterior larynx Future Recommendations: Recommend- induction with short-acting agent, and alternative techniques readily available Comments: CONSIDER DIFFICULT INTUBATION-- IV induction Bass-- intubation AM CRNA atraumatic-- upper teeth with chipping- all teeth unchanged with laryngoscopy -- bilat BS [place] Foil

## 2022-05-06 ENCOUNTER — Encounter (HOSPITAL_COMMUNITY): Payer: Self-pay | Admitting: Orthopedic Surgery

## 2022-05-06 LAB — CBC
HCT: 33.5 % — ABNORMAL LOW (ref 39.0–52.0)
Hemoglobin: 11.4 g/dL — ABNORMAL LOW (ref 13.0–17.0)
MCH: 32.3 pg (ref 26.0–34.0)
MCHC: 34 g/dL (ref 30.0–36.0)
MCV: 94.9 fL (ref 80.0–100.0)
Platelets: 160 10*3/uL (ref 150–400)
RBC: 3.53 MIL/uL — ABNORMAL LOW (ref 4.22–5.81)
RDW: 12.9 % (ref 11.5–15.5)
WBC: 15.3 10*3/uL — ABNORMAL HIGH (ref 4.0–10.5)
nRBC: 0 % (ref 0.0–0.2)

## 2022-05-06 LAB — BASIC METABOLIC PANEL
Anion gap: 9 (ref 5–15)
BUN: 32 mg/dL — ABNORMAL HIGH (ref 8–23)
CO2: 28 mmol/L (ref 22–32)
Calcium: 8.3 mg/dL — ABNORMAL LOW (ref 8.9–10.3)
Chloride: 103 mmol/L (ref 98–111)
Creatinine, Ser: 1.51 mg/dL — ABNORMAL HIGH (ref 0.61–1.24)
GFR, Estimated: 46 mL/min — ABNORMAL LOW (ref >60)
Glucose, Bld: 175 mg/dL — ABNORMAL HIGH (ref 70–99)
Potassium: 4.2 mmol/L (ref 3.5–5.1)
Sodium: 140 mmol/L (ref 135–145)

## 2022-05-06 MED ORDER — HYDROCODONE-ACETAMINOPHEN 5-325 MG PO TABS
1.0000 | ORAL_TABLET | ORAL | Status: DC | PRN
  Administered 2022-05-06 – 2022-05-09 (×10): 2 via ORAL
  Filled 2022-05-06 (×11): qty 2

## 2022-05-06 MED ORDER — PHENOL 1.4 % MT LIQD
1.0000 | OROMUCOSAL | Status: DC | PRN
Start: 2022-05-06 — End: 2022-05-09
  Administered 2022-05-06: 1 via OROMUCOSAL
  Filled 2022-05-06: qty 177

## 2022-05-06 MED ORDER — SODIUM CHLORIDE 0.9% FLUSH
10.0000 mL | INTRAVENOUS | Status: DC | PRN
  Administered 2022-05-06 – 2022-05-07 (×2): 10 mL

## 2022-05-06 NOTE — Progress Notes (Signed)
Physical Therapy Treatment Patient Details Name: [name] MRN: [id number] DOB: [birth year] Today's Date: 05/06/2022   History of Present Illness Pt is an 81 yo male presenting for L-THA, AA but was found to have osteomyelitis of R-great toe which was amputated at the distal phalanx on 04/18/22.   PMH: CKD, BPH, GERD, HTN, HLD, NM disorder, SOB, back surgery, L-TKA 2016.    PT Comments    Pt is progressing well with therapy. He completed ambulation with RW and exercises. Currently pt requires Min assist for bed mobility, transfers, and gait. He is limited by pain, muscle weakness, and lack of ROM due to muscle tightness. Pt will benefit from skilled therapy to address balance and mobility deficits by continuing to practice gait and therapeutic exercises. Recommending pt go to SNF upon DC from hospital. Will progress as able.   Recommendations for follow up therapy are one component of a multi-disciplinary discharge planning process, led by the attending physician.  Recommendations may be updated based on patient status, additional functional criteria and insurance authorization.  Follow Up Recommendations  Follow physician's recommendations for discharge plan and follow up therapies     Assistance Recommended at Discharge Intermittent Supervision/Assistance  Patient can return home with the following A little help with walking and/or transfers;A little help with bathing/dressing/bathroom;Assistance with cooking/housework;Direct supervision/assist for medications management;Assist for transportation;Help with stairs or ramp for entrance   Equipment Recommendations  None recommended by PT    Recommendations for Other Services       Precautions / Restrictions Precautions Precautions: Fall Required Braces or Orthoses: Other Brace Other Brace: Wear Bil post-op shoes Restrictions Weight Bearing Restrictions: No RLE Weight Bearing: Weight bearing as tolerated LLE Weight Bearing: Weight  bearing as tolerated Other Position/Activity Restrictions: With post up shoes     Mobility  Bed Mobility Overal bed mobility: Needs Assistance Bed Mobility: Supine to Sit     Supine to sit: Min assist, HOB elevated     General bed mobility comments: Pt required VC's for hand placement and assist for leg lift to EOB.    Transfers Overall transfer level: Needs assistance Equipment used: Rolling walker (2 wheels) Transfers: Sit to/from Stand Sit to Stand: From elevated surface, Min assist           General transfer comment: Pt relied on bed elevated prior to powerup, and needed therapist assist to stabilize walker for standing.    Ambulation/Gait Ambulation/Gait assistance: Min assist Gait Distance (Feet): 180 Feet Assistive device: Rolling walker (2 wheels) Gait Pattern/deviations: Step-through pattern, Decreased stride length, Decreased dorsiflexion - right, Decreased dorsiflexion - left, Antalgic, Trunk flexed Gait velocity: decr     General Gait Details: Pt needed cueing for walker positioning regarding safety, pt improved excessive trunk flexion from day prior. C/o muscle tightness in the hip area.   Stairs             Wheelchair Mobility    Modified Rankin (Stroke Patients Only)       Balance Overall balance assessment: Needs assistance Sitting-balance support: Feet unsupported, No upper extremity supported Sitting balance-Leahy Scale: Good Sitting balance - Comments: Pt sat several min completing phone call at EOB without UE or feet support.   Standing balance support: Bilateral upper extremity supported, During functional activity, Reliant on assistive device for balance Standing balance-Leahy Scale: Fair Standing balance comment: Pt completed standing needing AD for support.  Cognition Arousal/Alertness: Awake/alert Behavior During Therapy: WFL for tasks assessed/performed Overall Cognitive Status: Within  Functional Limits for tasks assessed                                 General Comments: Pt alert, has preferred method for completing tasks.        Exercises Total Joint Exercises Ankle Circles/Pumps: AROM, 20 reps, Both, Seated Quad Sets: AROM, Left, 10 reps, Seated Heel Slides: 10 reps, AROM, AAROM, Left, Seated Hip ABduction/ADduction: AROM, Left, 10 reps, Seated    General Comments        Pertinent Vitals/Pain Pain Assessment Pain Assessment: 0-10 Pain Score: 5  Pain Location: Lt hip with mobility. Pain Descriptors / Indicators: Discomfort, Aching, Grimacing, Tender, Tightness Pain Intervention(s): Monitored during session, Ice applied    Home Living                          Prior Function            PT Goals (current goals can now be found in the care plan section) Acute Rehab PT Goals Patient Stated Goal: To go home PT Goal Formulation: With patient Time For Goal Achievement: 05/19/22 Potential to Achieve Goals: Good Progress towards PT goals: Progressing toward goals    Frequency    7X/week      PT Plan Current plan remains appropriate    Co-evaluation              AM-PAC PT "6 Clicks" Mobility   Outcome Measure  Help needed turning from your back to your side while in a flat bed without using bedrails?: A Little Help needed moving from lying on your back to sitting on the side of a flat bed without using bedrails?: A Little Help needed moving to and from a bed to a chair (including a wheelchair)?: A Little Help needed standing up from a chair using your arms (e.g., wheelchair or bedside chair)?: A Little Help needed to walk in hospital room?: A Little Help needed climbing 3-5 steps with a railing? : A Lot 6 Click Score: 17    End of Session Equipment Utilized During Treatment: Gait belt Activity Tolerance: Patient tolerated treatment well Patient left: in chair;with call bell/phone within reach;with chair alarm  set Nurse Communication: Mobility status PT Visit Diagnosis: Unsteadiness on feet (R26.81);Other abnormalities of gait and mobility (R26.89);Muscle weakness (generalized) (M62.81);Pain Pain - Right/Left: Left Pain - part of body: Hip     Time: 1610-9604 PT Time Calculation (min) (ACUTE ONLY): 34 min  Charges:  $Gait Training: 8-22 mins $Therapeutic Exercise: 8-22 mins $Therapeutic Activity: 8-22 mins                     Lenn Cal, SPT Acute Rehab [facility] 05/06/2022, 12:27 PM

## 2022-05-07 ENCOUNTER — Inpatient Hospital Stay (HOSPITAL_COMMUNITY): Payer: Medicare Other

## 2022-05-07 LAB — CBC
HCT: 31.6 % — ABNORMAL LOW (ref 39.0–52.0)
Hemoglobin: 10.6 g/dL — ABNORMAL LOW (ref 13.0–17.0)
MCH: 32 pg (ref 26.0–34.0)
MCHC: 33.5 g/dL (ref 30.0–36.0)
MCV: 95.5 fL (ref 80.0–100.0)
Platelets: 142 10*3/uL — ABNORMAL LOW (ref 150–400)
RBC: 3.31 MIL/uL — ABNORMAL LOW (ref 4.22–5.81)
RDW: 13.3 % (ref 11.5–15.5)
WBC: 10.4 10*3/uL (ref 4.0–10.5)
nRBC: 0 % (ref 0.0–0.2)

## 2022-05-07 MED ORDER — ASPIRIN 325 MG PO TBEC: 325.0000 mg | DELAYED_RELEASE_TABLET | Freq: Two times a day (BID) | ORAL | 0 refills | Status: DC

## 2022-05-07 MED ORDER — DOXYCYCLINE HYCLATE 100 MG PO TABS: 100.0000 mg | ORAL_TABLET | Freq: Two times a day (BID) | ORAL | 0 refills | Status: DC

## 2022-05-07 MED ORDER — AMOXICILLIN-POT CLAVULANATE 875-125 MG PO TABS: 1.0000 | ORAL_TABLET | Freq: Two times a day (BID) | ORAL | 0 refills | Status: DC

## 2022-05-07 MED ORDER — HYDROCODONE-ACETAMINOPHEN 5-325 MG PO TABS: 1.0000 | ORAL_TABLET | ORAL | 0 refills | Status: DC | PRN

## 2022-05-07 NOTE — Plan of Care (Signed)
  Problem: Education: Goal: Knowledge of General Education information will improve Description: Including pain rating scale, medication(s)/side effects and non-pharmacologic comfort measures Outcome: Progressing   Problem: Activity: Goal: Risk for activity intolerance will decrease Outcome: Progressing   Problem: Pain Managment: Goal: General experience of comfort will improve Outcome: Progressing   

## 2022-05-07 NOTE — Progress Notes (Signed)
Subjective: 2 Days Post-Op Procedure(s) (LRB): LEFT TOTAL HIP ARTHROPLASTY ANTERIOR APPROACH (Left) Patient reports pain as mild.  The patient is sitting up in the chair.  He appears comfortable.  He has no significant complaints.  He does state that he worries about his liver function as he has had some slight elevation of his LFTs in the past and also was told at [facility] skilled nursing facility after a chest x-ray there that he had "pneumonia "he wants to make sure that this has cleared up.  He denies chest pain or shortness of breath.  He is progressing with physical therapy but he still has significant functional limitations.  He is taking by mouth and voiding okay.  Objective: Vital signs in last 24 hours: Temp:  [98.3 F (36.8 C)-98.4 F (36.9 C)] 98.4 F (36.9 C) (06/28 1320) Pulse Rate:  [67-96] 96 (06/28 1320) Resp:  [18] 18 (06/28 1320) BP: (125-152)/(63-73) 152/70 (06/28 1320) SpO2:  [94 %-95 %] 94 % (06/28 1320)  Intake/Output from previous day: 06/27 0701 - 06/28 0700 In: 780 [P.O.:780] Out: 2300 [Urine:2300] Intake/Output this shift: Total I/O In: 240 [P.O.:240] Out: 550 [Urine:550]  Recent Labs    05/05/22 0649 05/06/22 0433 05/07/22 0514  HGB 14.3 11.4* 10.6*   Recent Labs    05/06/22 0433 05/07/22 0514  WBC 15.3* 10.4  RBC 3.53* 3.31*  HCT 33.5* 31.6*  PLT 160 142*   Recent Labs    05/05/22 0649 05/06/22 0433  NA 141 140  K 3.5 4.2  CL 104 103  CO2 27 28  BUN 30* 32*  CREATININE 1.57* 1.51*  GLUCOSE 99 175*  CALCIUM 9.2 8.3*   No results for input(s): "LABPT", "INR" in the last 72 hours. Left hip exam: Neurovascular intact Sensation intact distally Intact pulses distally Dorsiflexion/Plantar flexion intact Incision: scant drainage No cellulitis present Compartment soft Mild swelling in left thigh. Right lower extremity wrapped with a soft dressing from his toes up to his knee.  His toe dressing is benign.   Assessment/Plan: 2  Days Post-Op Procedure(s) (LRB): LEFT TOTAL HIP ARTHROPLASTY ANTERIOR APPROACH (Left) 2 weeks status post right partial great toe amputation, healing. Plan: Up with therapy We will order chest x-ray today to make sure he has cleared his pneumonia radiographically..  We will also order a CMET to accurately assess his liver function. PICC line is still in place, but will need to be DC'd prior to discharge to SNF. Continue mobilization. Aspirin 325 mg enteric-coated twice daily x1 month postop for DVT prophylaxis. Weight-bear as tolerated on left lower extremity.  Weight-bear as tolerated on right lower extremity with postop shoe. Continue oral doxycycline and Augmentin x2 weeks per ID. We will plan on discharge to SNF tomorrow.  I will complete his discharge summary in the a.m.    Anticipated LOS equal to or greater than 2 midnights due to - Age 65 and older with one or more of the following:  - Obesity  - Expected need for hospital services (PT, OT, Nursing) required for safe  discharge  - Anticipated need for postoperative skilled nursing care or inpatient rehab  - Active co-morbidities: Status post right partial great toe amputation 2 weeks ago. OR   - Unanticipated findings during/Post Surgery: Slow post-op progression: GI, pain control, mobility  - Patient is a high risk of re-admission due to: Non-elective hospital admission within previous 6 months   [name] 05/07/2022, 2:50 PM

## 2022-05-07 NOTE — NC FL2 (Signed)
[place] MEDICAID FL2 LEVEL OF CARE SCREENING TOOL     IDENTIFICATION  Patient Name: [name] Birthdate: 04/24/1941 Sex: male Admission Date (Current Location): 05/05/2022  [facility] and [place] Number:  Herbalist and Address:  [name],  [place]      Provider Number: [id number]  Attending Physician Name and Address:  [name], MD  Relative Name and Phone Number:  daughter, Cyndy Freeze [phone]    Current Level of Care: Hospital Recommended Level of Care: [place] Prior Approval Number:    Date Approved/Denied:   PASRR Number: [id number]  Discharge Plan: SNF    Current Diagnoses: Patient Active Problem List   Diagnosis Date Noted   Primary osteoarthritis of left hip 04/23/2022   Osteomyelitis of great toe of right foot ([place]) 04/18/2022   Left hip pain 04/09/2022   Upper airway cough syndrome 02/03/2022   Acute rhinosinusitis 01/24/2022   Hypertension 09/30/2021   Allergic rhinitis due to pollen 03/25/2021   Benign prostatic hyperplasia with lower urinary tract symptoms 03/25/2021   Chronic fatigue syndrome 03/25/2021   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   Chronic kidney disease, stage 3 unspecified ([place]) 03/25/2021   Chronic pain 03/25/2021   Dyslipidemia 03/25/2021   Edema 03/25/2021   Generalized anxiety disorder 03/25/2021   Mild persistent asthma 03/25/2021   Moderate recurrent major depression ([place]) 03/25/2021   Morbid obesity ([place]) 03/25/2021   Non-pressure chronic ulcer of other part of right foot limited to breakdown of skin ([place]) 03/25/2021   Prediabetes 03/25/2021   Primary insomnia 03/25/2021   Sleep disturbance 03/25/2021   Paresthesia of skin 06/08/2020   Right foot drop 06/08/2020   Body mass index (BMI) 35.0-35.9, adult 03/13/2020   Spondylosis of cervical region without myelopathy or radiculopathy 01/05/2020   Elevated blood-pressure reading, without diagnosis of  hypertension 12/07/2019   Spinal stenosis of lumbar region with neurogenic claudication 01/15/2017   Spondylolisthesis of lumbar region 01/15/2017   Claw toe, acquired, left 01/06/2017   Achilles tendon contracture, bilateral 01/06/2017   Pain in metatarsus of both feet 01/06/2017   Tubular adenoma of colon 12/29/2016   Basal cell carcinoma 09/38/1829   Umbilical hernia s/p lap repair with mesh 06/27/2016 06/27/2016   Degenerative arthritis of knee 03/22/2015   Dyspnea on exertion 01/31/2015   Lumbar radiculopathy 12/12/2014   Facet hypertrophy of lumbar region 12/12/2014   Left knee pain 12/12/2014   Ulcer of lower limb ([place]) 01/10/2014   Varicose veins of lower extremities with other complications 93/71/6967   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 12/21/2013   Renal insufficiency 12/21/2013   Swelling in head/neck 07/28/2012   Venous (peripheral) insufficiency 05/25/2012   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   Pain in limb 02/12/2012   Hyperlipidemia 02/28/2008   Allergic rhinitis 02/28/2008   Sleep apnea 02/28/2008    Orientation RESPIRATION BLADDER Height & Weight     Self, Time, Situation, Place  Normal Continent Weight: 243 lb 9.7 oz (110.5 kg) Height:  '5\' 9"'$  (175.3 cm)  BEHAVIORAL SYMPTOMS/MOOD NEUROLOGICAL BOWEL NUTRITION STATUS      Continent Diet  AMBULATORY STATUS COMMUNICATION OF NEEDS Skin   Extensive Assist Verbally Other (Comment) (surgical incision)                       Personal Care Assistance Level of Assistance  Bathing, Dressing Bathing Assistance: Limited assistance   Dressing Assistance: Limited assistance     Functional  Limitations Info             SPECIAL CARE FACTORS FREQUENCY  PT (By licensed PT), OT (By licensed OT)     PT Frequency: 5x/wk OT Frequency: 5x/wk            Contractures Contractures Info: Not present    Additional Factors Info  Code Status, Allergies, Psychotropic Code Status Info:  Full Allergies Info: NKDA Psychotropic Info: see MAR         Current Medications (05/07/2022):  This is the current hospital active medication list Current Facility-Administered Medications  Medication Dose Route Frequency Provider Last Rate Last Admin   0.9 %  sodium chloride infusion   Intravenous Continuous Gary Fleet, PA-C 100 mL/hr at 05/05/22 1244 New Bag at 05/05/22 1244   acetaminophen (TYLENOL) tablet 325-650 mg  325-650 mg Oral Q6H PRN Gary Fleet, PA-C       albuterol (PROVENTIL) (2.5 MG/3ML) 0.083% nebulizer solution 2.5 mg  2.5 mg Nebulization Q6H PRN Gary Fleet, PA-C       alum & mag hydroxide-simeth (MAALOX/MYLANTA) 200-200-20 MG/5ML suspension 30 mL  30 mL Oral Q6H PRN Gary Fleet, PA-C   30 mL at 05/05/22 1411   amoxicillin-clavulanate (AUGMENTIN) 875-125 MG per tablet 1 tablet  1 tablet Oral Q12H Mignon Pine, DO   1 tablet at 05/07/22 3009   aspirin EC tablet 325 mg  325 mg Oral BID PC Gary Fleet, PA-C   325 mg at 05/07/22 0854   bisacodyl (DULCOLAX) EC tablet 5 mg  5 mg Oral Daily PRN Gary Fleet, PA-C       buPROPion (WELLBUTRIN XL) 24 hr tablet 450 mg  450 mg Oral q morning Gary Fleet, PA-C   450 mg at 05/07/22 0854   calcium carbonate (TUMS - dosed in mg elemental calcium) chewable tablet 400 mg of elemental calcium  2 tablet Oral Q6H PRN [place], Jesse J, PA-C   400 mg of elemental calcium at 05/07/22 0017   docusate sodium (COLACE) capsule 100 mg  100 mg Oral BID Gary Fleet, PA-C   100 mg at 05/07/22 0854   doxazosin (CARDURA) tablet 8 mg  8 mg Oral QHS Gary Fleet, PA-C   8 mg at 05/06/22 2359   doxycycline (VIBRA-TABS) tablet 100 mg  100 mg Oral Q12H Mignon Pine, DO   100 mg at 05/07/22 0854   furosemide (LASIX) tablet 40 mg  40 mg Oral Daily Gary Fleet, PA-C   40 mg at 05/07/22 0854   gabapentin (NEURONTIN) capsule 600 mg  600 mg Oral QHS Gary Fleet, PA-C   600 mg at 05/06/22 2359   HYDROcodone-acetaminophen  (NORCO/VICODIN) 5-325 MG per tablet 1-2 tablet  1-2 tablet Oral Q4H PRN Gary Fleet, PA-C   2 tablet at 05/07/22 2330   methocarbamol (ROBAXIN) tablet 500 mg  500 mg Oral Q6H PRN Gary Fleet, PA-C   500 mg at 05/07/22 0762   Or   methocarbamol (ROBAXIN) 500 mg in dextrose 5 % 50 mL IVPB  500 mg Intravenous Q6H PRN Gary Fleet, PA-C       mirtazapine (REMERON) tablet 15 mg  15 mg Oral QHS Gary Fleet, PA-C   15 mg at 05/06/22 2359   mometasone-formoterol (DULERA) 200-5 MCG/ACT inhaler 2 puff  2 puff Inhalation BID Gary Fleet, PA-C   2 puff at 05/07/22 0828   montelukast (SINGULAIR) tablet 10 mg  10 mg Oral QHS Gary Fleet, PA-C   10 mg at 05/06/22 2359  morphine (PF) 2 MG/ML injection 0.5-1 mg  0.5-1 mg Intravenous Q2H PRN Gary Fleet, PA-C       multivitamin with minerals tablet 1 tablet  1 tablet Oral q morning Gary Fleet, PA-C   1 tablet at 05/07/22 0853   ondansetron (ZOFRAN) tablet 4 mg  4 mg Oral Q6H PRN Gary Fleet, PA-C       Or   ondansetron [facility]) injection 4 mg  4 mg Intravenous Q6H PRN Gary Fleet, PA-C   4 mg at 05/05/22 1326   pantoprazole (PROTONIX) EC tablet 40 mg  40 mg Oral Daily Gary Fleet, PA-C   40 mg at 05/07/22 0854   phenol (CHLORASEPTIC) mouth spray 1 spray  1 spray Mouth/Throat PRN Gary Fleet, PA-C   1 spray at 05/06/22 1653   polyethylene glycol (MIRALAX / GLYCOLAX) packet 17 g  17 g Oral Daily PRN Gary Fleet, PA-C       potassium chloride SA (KLOR-CON M) CR tablet 20 mEq  20 mEq Oral BID Gary Fleet, PA-C   20 mEq at 05/07/22 0854   rosuvastatin (CRESTOR) tablet 5 mg  5 mg Oral q morning Gary Fleet, PA-C   5 mg at 05/07/22 5456   sodium chloride flush (NS) 0.9 % injection 10-40 mL  10-40 mL Intracatheter PRN [name], MD   10 mL at 05/07/22 0515     Discharge Medications: Please see discharge summary for a list of discharge medications.  Relevant Imaging Results:  Relevant Lab Results:   Additional  Information SS# [id number]  Lennart Pall, LCSW

## 2022-05-07 NOTE — TOC Initial Note (Signed)
Transition of Care [facility]) - Initial/Assessment Note    Patient Details  Name: [name] MRN: [id number] Date of Birth: 02-Nov-1941  [facility] CM/SW Contact:    Lennart Pall, LCSW Phone Number: 05/07/2022, 11:57 AM  Clinical Narrative:                 Met with pt today to discuss dc planning needs - pt familiar to me from prior hospitalization.  Pt agreed that SNF rehab continues to be needed prior to his eventual return home where he is alone.  He is agreeable with CSW to start SNF bed search as he did not "hold" his bed at [facility] and they are currently full.    Expected Discharge Plan: Skilled Nursing Facility Barriers to Discharge: SNF Pending bed offer   Patient Goals and CMS Choice Patient states their goals for this hospitalization and ongoing recovery are:: return home following rehab      Expected Discharge Plan and Services Expected Discharge Plan: [place] In-house Referral: Clinical Social Work   Post Acute Care Choice: [place] Living arrangements for the past 2 months: [place]                                      Prior Living Arrangements/Services Living arrangements for the past 2 months: Single Family Home Lives with:: Self Patient language and need for interpreter reviewed:: Yes Do you feel safe going back to the place where you live?: Yes      Need for Family Participation in Patient Care: Yes (Comment) Care giver support system in place?: No (comment)   Criminal Activity/Legal Involvement Pertinent to Current Situation/Hospitalization: No - Comment as needed  Activities of Daily Living Home Assistive Devices/Equipment: Cane (specify quad or straight) ADL Screening (condition at time of admission) Patient's cognitive ability adequate to safely complete daily activities?: Yes Is the patient deaf or have difficulty hearing?: No Does the patient have difficulty seeing, even when  wearing glasses/contacts?: No Does the patient have difficulty concentrating, remembering, or making decisions?: No Patient able to express need for assistance with ADLs?: Yes Does the patient have difficulty dressing or bathing?: Yes Independently performs ADLs?: Yes (appropriate for developmental age) Communication: Independent Dressing (OT): Needs assistance Is this a change from baseline?: Pre-admission baseline Grooming: Needs assistance Is this a change from baseline?: Pre-admission baseline Feeding: Independent Bathing: Needs assistance Is this a change from baseline?: Pre-admission baseline Toileting: Needs assistance Is this a change from baseline?: Pre-admission baseline In/Out Bed: Needs assistance Is this a change from baseline?: Pre-admission baseline Walks in Home: Needs assistance Is this a change from baseline?: Pre-admission baseline Does the patient have difficulty walking or climbing stairs?: Yes Weakness of Legs: Both Weakness of Arms/Hands: None  Permission Sought/Granted Permission sought to share information with : Facility Sport and exercise psychologist, Family Supports Permission granted to share information with : Yes, Verbal Permission Granted  Share Information with NAME: Cyndy Freeze     Permission granted to share info w Relationship: daughter  Permission granted to share info w Contact Information: [phone]  Emotional Assessment Appearance:: Appears stated age Attitude/Demeanor/Rapport: Gracious Affect (typically observed): Accepting Orientation: : Oriented to Self, Oriented to Place, Oriented to  Time, Oriented to Situation Alcohol / Substance Use: Not Applicable Psych Involvement: No (comment)  Admission diagnosis:  Primary osteoarthritis of left hip [M16.12] Patient Active Problem List  Diagnosis Date Noted   Primary osteoarthritis of left hip 04/23/2022   Osteomyelitis of great toe of right foot ([place]) 04/18/2022   Left hip pain 04/09/2022    Upper airway cough syndrome 02/03/2022   Acute rhinosinusitis 01/24/2022   Hypertension 09/30/2021   Allergic rhinitis due to pollen 03/25/2021   Benign prostatic hyperplasia with lower urinary tract symptoms 03/25/2021   Chronic fatigue syndrome 03/25/2021   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   Chronic kidney disease, stage 3 unspecified ([place]) 03/25/2021   Chronic pain 03/25/2021   Dyslipidemia 03/25/2021   Edema 03/25/2021   Generalized anxiety disorder 03/25/2021   Mild persistent asthma 03/25/2021   Moderate recurrent major depression ([place]) 03/25/2021   Morbid obesity ([place]) 03/25/2021   Non-pressure chronic ulcer of other part of right foot limited to breakdown of skin ([place]) 03/25/2021   Prediabetes 03/25/2021   Primary insomnia 03/25/2021   Sleep disturbance 03/25/2021   Paresthesia of skin 06/08/2020   Right foot drop 06/08/2020   Body mass index (BMI) 35.0-35.9, adult 03/13/2020   Spondylosis of cervical region without myelopathy or radiculopathy 01/05/2020   Elevated blood-pressure reading, without diagnosis of hypertension 12/07/2019   Spinal stenosis of lumbar region with neurogenic claudication 01/15/2017   Spondylolisthesis of lumbar region 01/15/2017   Claw toe, acquired, left 01/06/2017   Achilles tendon contracture, bilateral 01/06/2017   Pain in metatarsus of both feet 01/06/2017   Tubular adenoma of colon 12/29/2016   Basal cell carcinoma 25/49/8264   Umbilical hernia s/p lap repair with mesh 06/27/2016 06/27/2016   Degenerative arthritis of knee 03/22/2015   Dyspnea on exertion 01/31/2015   Lumbar radiculopathy 12/12/2014   Facet hypertrophy of lumbar region 12/12/2014   Left knee pain 12/12/2014   Ulcer of lower limb ([place]) 01/10/2014   Varicose veins of lower extremities with other complications 15/83/0940   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 12/21/2013   Renal insufficiency 12/21/2013   Swelling in head/neck 07/28/2012   Venous  (peripheral) insufficiency 05/25/2012   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   Pain in limb 02/12/2012   Hyperlipidemia 02/28/2008   Allergic rhinitis 02/28/2008   Sleep apnea 02/28/2008   PCP:  Cari Caraway, MD Pharmacy:   [id number] [address] [place] [address] Phone: [phone] Fax: [phone]     Social Determinants of Health (SDOH) Interventions    Readmission Risk Interventions    05/07/2022   11:55 AM 04/22/2022   12:57 PM  Readmission Risk Prevention Plan  Post Dischage Appt  Complete  Medication Screening  Complete  Transportation Screening Complete Complete  PCP or Specialist Appt within 5-7 Days Complete   Home Care Screening Complete   Medication Review (RN CM) Complete

## 2022-05-07 NOTE — Progress Notes (Signed)
Physical Therapy Treatment Patient Details Name: [name] MRN: [id number] DOB: 1941-05-17 Today's Date: 05/07/2022   History of Present Illness Pt is an 81 yo male presenting for L-THA, AA but was found to have osteomyelitis of R-great toe which was amputated at the distal phalanx on 04/18/22.   PMH: CKD, BPH, GERD, HTN, HLD, NM disorder, SOB, back surgery, L-TKA 2016.    PT Comments    Patient making good progress, ambulated ~180' with Min guard/assist and demonstrated improved use/management of RW. Balance improved by pt maintaining safe proximity to RW. EOS reviewed HEP for ROM, strength, and circulation. Will continue to progress as able. Recommend rehab at SNF setting to regain independence.    Recommendations for follow up therapy are one component of a multi-disciplinary discharge planning process, led by the attending physician.  Recommendations may be updated based on patient status, additional functional criteria and insurance authorization.  Follow Up Recommendations  Skilled nursing-short term rehab (<3 hours/day) Can patient physically be transported by private vehicle: Yes   Assistance Recommended at Discharge Intermittent Supervision/Assistance  Patient can return home with the following A little help with walking and/or transfers;A little help with bathing/dressing/bathroom;Assistance with cooking/housework;Direct supervision/assist for medications management;Assist for transportation;Help with stairs or ramp for entrance   Equipment Recommendations  None recommended by PT    Recommendations for Other Services       Precautions / Restrictions Precautions Precautions: Fall Required Braces or Orthoses: Other Brace Other Brace: Wear Bil post-op shoes Restrictions Weight Bearing Restrictions: No RLE Weight Bearing: Weight bearing as tolerated Other Position/Activity Restrictions: With post up shoes     Mobility  Bed Mobility               General bed  mobility comments: pt OOB in recliner    Transfers Overall transfer level: Needs assistance Equipment used: Rolling walker (2 wheels) Transfers: Sit to/from Stand Sit to Stand: Min assist           General transfer comment: light assist to steady with rise. pt initiated power up from recliner with bil UE use and cues for technique.    Ambulation/Gait Ambulation/Gait assistance: Min assist, Min guard Gait Distance (Feet): 180 Feet Assistive device: Rolling walker (2 wheels) Gait Pattern/deviations: Step-through pattern, Decreased stride length, Decreased dorsiflexion - right, Decreased dorsiflexion - left, Antalgic, Trunk flexed Gait velocity: decr     General Gait Details: min assist at start with guarding intermittently. pt maintained improved position to RW this session and required fewer cues as balance improved slightly with safe use of RW.   Stairs             Wheelchair Mobility    Modified Rankin (Stroke Patients Only)       Balance Overall balance assessment: Needs assistance Sitting-balance support: Feet unsupported, No upper extremity supported Sitting balance-Leahy Scale: Good     Standing balance support: Bilateral upper extremity supported, During functional activity, Reliant on assistive device for balance Standing balance-Leahy Scale: Fair                              Cognition Arousal/Alertness: Awake/alert Behavior During Therapy: WFL for tasks assessed/performed Overall Cognitive Status: Within Functional Limits for tasks assessed                                 General Comments: Pt alert, has preferred  method for completing tasks.        Exercises Total Joint Exercises Ankle Circles/Pumps: AROM, 20 reps, Both, Seated Short Arc Quad: AROM, 10 reps, Seated, Both Heel Slides: 10 reps, AAROM, Left, Seated Long Arc Quad: AROM, Left, 10 reps, Seated    General Comments        Pertinent Vitals/Pain Pain  Assessment Pain Assessment: Faces Faces Pain Scale: Hurts even more Pain Location: Lt hip with mobility. Pain Descriptors / Indicators: Discomfort, Aching, Grimacing, Tender, Tightness Pain Intervention(s): Limited activity within patient's tolerance, Monitored during session, Repositioned, Premedicated before session    Home Living                          Prior Function            PT Goals (current goals can now be found in the care plan section) Acute Rehab PT Goals Patient Stated Goal: To go home PT Goal Formulation: With patient Time For Goal Achievement: 05/19/22 Potential to Achieve Goals: Good Progress towards PT goals: Progressing toward goals    Frequency    7X/week      PT Plan Current plan remains appropriate    Co-evaluation              AM-PAC PT "6 Clicks" Mobility   Outcome Measure  Help needed turning from your back to your side while in a flat bed without using bedrails?: A Little Help needed moving from lying on your back to sitting on the side of a flat bed without using bedrails?: A Little Help needed moving to and from a bed to a chair (including a wheelchair)?: A Little Help needed standing up from a chair using your arms (e.g., wheelchair or bedside chair)?: A Little Help needed to walk in hospital room?: A Little Help needed climbing 3-5 steps with a railing? : A Lot 6 Click Score: 17    End of Session Equipment Utilized During Treatment: Gait belt Activity Tolerance: Patient tolerated treatment well Patient left: in chair;with call bell/phone within reach;with chair alarm set Nurse Communication: Mobility status PT Visit Diagnosis: Unsteadiness on feet (R26.81);Other abnormalities of gait and mobility (R26.89);Muscle weakness (generalized) (M62.81);Pain Pain - Right/Left: Left Pain - part of body: Hip     Time: 1040-1104 PT Time Calculation (min) (ACUTE ONLY): 24 min  Charges:  $Gait Training: 8-22 mins $Therapeutic  Exercise: 8-22 mins                     Verner Mould, DPT Acute Rehabilitation Services Office [phone] Pager [phone]  05/07/22 12:57 PM

## 2022-05-08 LAB — COMPREHENSIVE METABOLIC PANEL
ALT: 49 U/L — ABNORMAL HIGH (ref 0–44)
AST: 48 U/L — ABNORMAL HIGH (ref 15–41)
Albumin: 2.8 g/dL — ABNORMAL LOW (ref 3.5–5.0)
Alkaline Phosphatase: 115 U/L (ref 38–126)
Anion gap: 9 (ref 5–15)
BUN: 32 mg/dL — ABNORMAL HIGH (ref 8–23)
CO2: 28 mmol/L (ref 22–32)
Calcium: 8.5 mg/dL — ABNORMAL LOW (ref 8.9–10.3)
Chloride: 105 mmol/L (ref 98–111)
Creatinine, Ser: 1.65 mg/dL — ABNORMAL HIGH (ref 0.61–1.24)
GFR, Estimated: 41 mL/min — ABNORMAL LOW (ref >60)
Glucose, Bld: 110 mg/dL — ABNORMAL HIGH (ref 70–99)
Potassium: 3.6 mmol/L (ref 3.5–5.1)
Sodium: 142 mmol/L (ref 135–145)
Total Bilirubin: 0.7 mg/dL (ref 0.3–1.2)
Total Protein: 6.2 g/dL — ABNORMAL LOW (ref 6.5–8.1)

## 2022-05-08 NOTE — Plan of Care (Signed)
  Problem: Clinical Measurements: Goal: Diagnostic test results will improve Outcome: Progressing   Problem: Clinical Measurements: Goal: Respiratory complications will improve Outcome: Progressing   Problem: Clinical Measurements: Goal: Cardiovascular complication will be avoided Outcome: Progressing   Problem: Elimination: Goal: Will not experience complications related to bowel motility Outcome: Progressing   Problem: Skin Integrity: Goal: Risk for impaired skin integrity will decrease Outcome: Progressing   

## 2022-05-08 NOTE — Telephone Encounter (Signed)
OV notes and clearance form have been faxed back to [facility]. Nothing further needed at this time.  

## 2022-05-08 NOTE — TOC Progression Note (Signed)
Transition of Care [name]) - Progression Note    Patient Details  Name: [name] MRN: [id number] Date of Birth: May 06, 1941  Transition of Care [facility]) CM/SW Contact  Servando Snare, [place] Phone Number: 05/08/2022, 3:11 PM  Clinical Narrative:   Patient chose bed at [name]. Facility can accept patient in the morning. Patient notified.     Expected Discharge Plan: Skilled Nursing Facility Barriers to Discharge: SNF Pending bed offer  Expected Discharge Plan and Services Expected Discharge Plan: [place] In-house Referral: Clinical Social Work   Post Acute Care Choice: [place] Living arrangements for the past 2 months: Single Family Home Expected Discharge Date: 05/08/22                                     Social Determinants of Health (SDOH) Interventions    Readmission Risk Interventions    05/07/2022   11:55 AM 04/22/2022   12:57 PM  Readmission Risk Prevention Plan  Post Dischage Appt  Complete  Medication Screening  Complete  Transportation Screening Complete Complete  PCP or Specialist Appt within 5-7 Days Complete   Home Care Screening Complete   Medication Review (RN CM) Complete

## 2022-05-08 NOTE — Progress Notes (Addendum)
Subjective: 3 Days Post-Op Procedure(s) (LRB): LEFT TOTAL HIP ARTHROPLASTY ANTERIOR APPROACH (Left) Patient reports pain as mild.  Taking by mouth and voiding okay.  Making slow progress with PT, but he is getting up and ambulating.  Objective: Vital signs in last 24 hours: Temp:  [97.9 F (36.6 C)-98.4 F (36.9 C)] 97.9 F (36.6 C) (06/29 0512) Pulse Rate:  [62-96] 62 (06/29 0512) Resp:  [18] 18 (06/29 0512) BP: (113-152)/(67-94) 124/67 (06/29 0512) SpO2:  [92 %-97 %] 97 % (06/29 0810)  Intake/Output from previous day: 06/28 0701 - 06/29 0700 In: 720 [P.O.:720] Out: 1600 [Urine:1600] Intake/Output this shift: Total I/O In: -  Out: 700 [Urine:700]  Recent Labs    05/06/22 0433 05/07/22 0514  HGB 11.4* 10.6*   Recent Labs    05/06/22 0433 05/07/22 0514  WBC 15.3* 10.4  RBC 3.53* 3.31*  HCT 33.5* 31.6*  PLT 160 142*   Recent Labs    05/06/22 0433 05/08/22 0317  NA 140 142  K 4.2 3.6  CL 103 105  CO2 28 28  BUN 32* 32*  CREATININE 1.51* 1.65*  GLUCOSE 175* 110*  CALCIUM 8.3* 8.5*   No results for input(s): "LABPT", "INR" in the last 72 hours. Left hip exam: Neurovascular intact Sensation intact distally Intact pulses distally Dorsiflexion/Plantar flexion intact Incision: scant drainage No cellulitis present Right lower extremity exam: Ace wrap intact.  Great toe wound benign with some scabbing.  No drainage  Assessment/Plan: 3 Days Post-Op Procedure(s) (LRB): LEFT TOTAL HIP ARTHROPLASTY ANTERIOR APPROACH (Left) Status post partial amputation right great toe for osteomyelitis approximately 2 weeks ago. Plan: Up with therapy Discharge to SNF WBAT bilateral lower extremities.  He needs to wear a postop shoe on his right foot when up. No hip precautions for left hip. Aspirin 325 mg enteric-coated twice daily until he is 1 month postop. Augmentin and doxycycline x2 weeks. Hydrocodone 5 mg as needed for pain.  Use sparingly. DC IV and PICC line prior  to discharge. Follow-up with Dr. Berenice Primas in the office in 10 to 14 days.   Anticipated LOS equal to or greater than 2 midnights due to - Age 81 and older with one or more of the following:  - Obesity  - Expected need for hospital services (PT, OT, Nursing) required for safe  discharge  - Anticipated need for postoperative skilled nursing care or inpatient rehab  - Active co-morbidities: None OR   - Unanticipated findings during/Post Surgery: Slow post-op progression: GI, pain control, mobility  - Patient is a high risk of re-admission due to: Non-elective hospital admission within previous 6 months He is status post right great toe amputation approximately 2 weeks ago.  Erlene Senters 05/08/2022, 8:18 AM

## 2022-05-08 NOTE — Discharge Summary (Cosign Needed Addendum)
Patient ID: [name] MRN: [id number] DOB/AGE: 81-30-1942 81 y.o.  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 Resolving cellulitis right lower extremity.  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  Dr. Juleen China.  Discharged Condition: Improved  Hospital Course: ROBEN SCHLIEP is an 81 y.o. male who was admitted 05/05/2022 for operative treatment ofPrimary osteoarthritis of left hip. Patient has severe unremitting pain that affects sleep, daily activities, and work/hobbies. After pre-op clearance the patient was taken to the operating room on 05/05/2022 and underwent  Procedure(s): LEFT TOTAL HIP ARTHROPLASTY ANTERIOR APPROACH.    Patient was given perioperative antibiotics:  Anti-infectives (From admission, onward)    Start     Dose/Rate Route Frequency Ordered Stop   05/07/22 1000   amoxicillin-clavulanate (AUGMENTIN) 875-125 MG per tablet 1 tablet        1 tablet Oral Every 12 hours 05/05/22 1459 05/21/22 0959   05/07/22 1000  doxycycline (VIBRA-TABS) tablet 100 mg        100 mg Oral Every 12 hours 05/05/22 1459 05/21/22 0959   05/07/22 0000  amoxicillin-clavulanate (AUGMENTIN) 875-125 MG tablet        1 tablet Oral Every 12 hours 05/07/22 1506     05/07/22 0000  doxycycline (VIBRA-TABS) 100 MG tablet        100 mg Oral Every 12 hours 05/07/22 1506     05/05/22 1530  cefTRIAXone (ROCEPHIN) 2 g in sodium chloride 0.9 % 100 mL IVPB        2 g 200 mL/hr over 30 Minutes Intravenous Every 24 hours 05/05/22 1254 05/06/22 1534   05/05/22 1400  vancomycin (VANCOCIN) IVPB 1000 mg/200 mL premix        1,000 mg 200 mL/hr over 60 Minutes Intravenous Every 24 hours 05/05/22 1331 05/06/22 1350   05/05/22 1300  cefTRIAXone (ROCEPHIN) IVPB  Status:  Discontinued        2 g Intravenous Every 24 hours 05/05/22 1210 05/05/22 1254   05/05/22 1300  vancomycin IVPB  Status:  Discontinued        1,000 mg Intravenous Every 24 hours 05/05/22 1210 05/05/22 1331  05/05/22 0700  ceFAZolin (ANCEF) IVPB 2g/100 mL premix        2 g 200 mL/hr over 30 Minutes Intravenous On call to O.R. 05/05/22 6389 05/05/22 3734        Patient was given sequential compression devices, early ambulation, and chemoprophylaxis to prevent DVT.  Pt took lassix '40Mg'$  per day for chronic right heart failure and lower extremity swelling.  Patient benefited maximally from hospital stay and there were no complications.    Recent vital signs: Patient Vitals for the past 24 hrs:  BP Temp Temp src Pulse Resp SpO2  05/08/22 0810 -- -- -- -- -- 97 %  05/08/22 0512 124/67 97.9 F (36.6 C) Oral 62 18 95 %  05/07/22 2057 (!) 113/94 98.2 F (36.8 C) Oral 79 18 92 %  05/07/22 1320 (!) 152/70 98.4 F (36.9 C) Oral 96 18 94 %     Recent laboratory studies:  Recent Labs    05/06/22 0433 05/07/22 0514 05/08/22 0317   WBC 15.3* 10.4  --   HGB 11.4* 10.6*  --   HCT 33.5* 31.6*  --   PLT 160 142*  --   NA 140  --  142  K 4.2  --  3.6  CL 103  --  105  CO2 28  --  28  BUN 32*  --  32*  CREATININE 1.51*  --  1.65*  GLUCOSE 175*  --  110*  CALCIUM 8.3*  --  8.5*     Discharge Medications:   Allergies as of 05/08/2022   No Known Allergies      Medication List     STOP taking these medications    cefTRIAXone  IVPB Commonly known as: ROCEPHIN   vancomycin  IVPB       TAKE these medications    acetaminophen 325 MG tablet Commonly known as: TYLENOL Take 1-2 tablets (325-650 mg total) by mouth every 6 (six) hours as needed for mild pain (pain score 1-3 or temp > 100.5).   albuterol 108 (90 Base) MCG/ACT inhaler Commonly known as: VENTOLIN HFA Inhale 2 puffs into the lungs every 6 (six) hours as needed for wheezing or shortness of breath.   amoxicillin-clavulanate 875-125 MG tablet Commonly known as: AUGMENTIN Take 1 tablet by mouth every 12 (twelve) hours.   aspirin EC 325 MG tablet Take 1 tablet (325 mg total) by mouth 2 (two) times daily after a meal. Take until one month post op for DVT prophlaxis   bisacodyl 5 MG EC tablet Commonly known as: DULCOLAX Take 1 tablet (5 mg total) by mouth daily as needed for moderate constipation.   budesonide-formoterol 160-4.5 MCG/ACT inhaler Commonly known as: SYMBICORT Inhale 2 puffs into the lungs 2 (two) times daily.   buPROPion 150 MG 24 hr tablet Commonly known as: WELLBUTRIN XL Take 450 mg by mouth every morning.   CALCIUM PO Take 1 tablet by mouth every morning.   docusate sodium 100 MG capsule Commonly known as: COLACE Take 1 capsule (100 mg total) by mouth 2 (two) times daily.   doxazosin 8 MG tablet Commonly known as: CARDURA TAKE 1 TABLET BY MOUTH DAILY  . Take at night What changed:  how much to take how to take this when to take this additional instructions   doxycycline 100 MG tablet Commonly known as:  VIBRA-TABS Take 1 tablet (100 mg total) by mouth every 12 (twelve) hours.   esomeprazole 20 MG capsule Commonly known as: NEXIUM Take 20  mg by mouth every morning.   furosemide 40 MG tablet Commonly known as: LASIX Take 40 mg by mouth daily.   gabapentin 300 MG capsule Commonly known as: NEURONTIN TAKE 1 TO 2 CAPSULES BY MOUTH AT BEDTIME What changed: See the new instructions.   HYDROcodone-acetaminophen 5-325 MG tablet Commonly known as: Norco Take 1-2 tablets by mouth every 4 (four) hours as needed for moderate pain (Use sparingly). What changed:  how much to take how to take this when to take this reasons to take this additional instructions   methocarbamol 500 MG tablet Commonly known as: ROBAXIN Take 1 tablet (500 mg total) by mouth every 8 (eight) hours as needed for muscle spasms. What changed: when to take this   mirtazapine 15 MG tablet Commonly known as: REMERON Take 15 mg by mouth at bedtime.   montelukast 10 MG tablet Commonly known as: SINGULAIR Take 1 tablet (10 mg total) by mouth at bedtime.   multivitamin with minerals Tabs tablet Take 1 tablet by mouth every morning.   polyethylene glycol 17 g packet Commonly known as: MIRALAX / GLYCOLAX Take 17 g by mouth daily as needed for mild constipation.   potassium chloride SA 20 MEQ tablet Commonly known as: KLOR-CON M Take 1 tablet (20 mEq total) by mouth 2 (two) times daily.   rosuvastatin 5 MG tablet Commonly known as: CRESTOR Take 5 mg by mouth every morning.               Discharge Care Instructions  (From admission, onward)           Start     Ordered   05/08/22 0000  Weight bearing as tolerated       Comments: Wear postop shoe on right foot when up.  No hip precautions for left hip.  He had an anterior total hip replacement.  Question:  Laterality  Answer:  bilateral   05/08/22 0832            Diagnostic Studies: DG Chest 2 View  Result Date: 05/07/2022 CLINICAL DATA:   Provided history: History of pneumonia. EXAM: CHEST - 2 VIEW COMPARISON:  Prior chest radiographs 03/06/2022 and earlier. FINDINGS: A right-sided PICC is present with tip projecting at the level of the mid to lower SVC. The patient's chin partially obscures the right lung apex on the AP radiograph. Heart size within normal limits. No appreciable airspace consolidation. No evidence of pleural effusion or pneumothorax. No acute bony abnormality identified. Advanced degenerative changes of the left glenohumeral joint. Degenerative changes of the spine. IMPRESSION: The patient's chin partially obscures the right lung apex on the AP radiograph. No evidence of acute cardiopulmonary abnormality. Electronically Signed   By: Kellie Simmering D.O.   On: 05/07/2022 16:06   DG HIP UNILAT WITH PELVIS 1V LEFT  Result Date: 05/05/2022 CLINICAL DATA:  Fluoroscopic assistance for left hip arthroplasty EXAM: DG HIP (WITH OR WITHOUT PELVIS) 1V*L* COMPARISON:  MR done on 04/22/2019 FINDINGS: Fluoroscopic images show left hip arthroplasty. Fluoroscopic time was 5 seconds. Estimated radiation dose 8.41 mGy. IMPRESSION: Fluoroscopic assistance was provided for left hip arthroplasty. Electronically Signed   By: Elmer Picker M.D.   On: 05/05/2022 09:56   DG C-Arm 1-60 Min-No Report  Result Date: 05/05/2022 Fluoroscopy was utilized by the requesting physician.  No radiographic interpretation.   DG C-Arm 1-60 Min-No Report  Result Date: 05/05/2022 Fluoroscopy was utilized by the requesting physician.  No radiographic interpretation.   DG FLUORO GUIDED NEEDLE PLC  ASPIRATION/INJECTION LOC  Result Date: 04/22/2022 CLINICAL DATA:  Concern for infection EXAM: LEFT HIP ASPIRATION UNDER FLUOROSCOPY COMPARISON:  None Available. FLUOROSCOPY: Radiation Exposure Index (as provided by the fluoroscopic device): 0.7 mGy Kerma PROCEDURE: Overlying skin prepped with ChloraPrep, draped in the usual sterile fashion, and infiltrated locally  with buffered Lidocaine. Curved 20 gauge spinal needle advanced to the superolateral margin of the left femoral head/neck junction utilizing an inferior to superior approach. Less than 0.5 mL serosanguineous fluid was aspirated from the joint and mixed with sterile saline. This was ultimately sent to the laboratory for analysis. Additional lateral approach was attempted utilizing both 20 gauge and 18 gauge needles with minimal to no fluid return upon aspiration. A small amount of 1:! contrast mixed with saline was injected to ensure adequate needle placement in the joint. The patient experienced postprocedural pain. On exam, there was a normal femoral pulse, no bleeding from the puncture site, and no significant bruising. Slight flexion of the hip and knee mildly alleviated the pain. IMPRESSION: Left hip aspiration under fluoroscopy yielding a small amount of serosanguineous fluid. Sample was sent to the laboratory for analysis. The patient experienced postprocedural pain. On exam, there was a normal femoral pulse, no bleeding from the puncture site, and no significant bruising. Slight flexion of the hip and knee mildly alleviated the pain. Recommend close clinical observation for worsening symptoms. These results were called by telephone at the time of interpretation on 04/22/2022 at approximately 10:30 AM to provider [facility] , who verbally acknowledged these results. Electronically Signed   By: Maurine Simmering M.D.   On: 04/22/2022 11:27   [place] EKG SITE RITE  Result Date: 04/22/2022 If Site Rite image not attached, placement could not be confirmed due to current cardiac rhythm.  MR HIP LEFT WO CONTRAST  Result Date: 04/21/2022 CLINICAL DATA:  Hip osteonecrosis, surgical planning. EXAM: MR OF THE LEFT HIP WITHOUT CONTRAST TECHNIQUE: Multiplanar, multisequence MR imaging was performed. No intravenous contrast was administered. COMPARISON:  Pelvic and right hip radiographs 03/26/2020. Pelvic CT 08/08/2021.  Pelvic MRI 09/20/2021. FINDINGS: Bones: Interval development of moderate flattening of the left femoral head superiorly with bone marrow edema throughout the femoral head and neck. There is also progressive erosion of left acetabulum superiorly with associated mild subchondral edema. No serpiginous subchondral signal abnormalities typical for osteonecrosis. The right femoral head is intact. The visualized bony pelvis is otherwise intact. Degenerative changes are present at the L5-S1 disc space. The visualized sacroiliac joints and symphysis pubis appear normal. Articular cartilage and labrum Articular cartilage: As above, new flattening of the left femoral head with progressive erosion of the left superior acetabulum. Labrum: Severe degeneration of the left acetabular labrum. Joint or bursal effusion Joint effusion: Moderate-sized joint effusion has enlarged compared with the prior study. Fluid extends anteriorly into the left iliopsoas bursa. Bursae: Moderate fluid in the left iliopsoas bursa, communicating with the hip joint. No other focal fluid collections. There is ill-defined edema lateral to the left greater trochanter. Muscles and tendons Muscles and tendons: The visualized gluteus, hamstring and iliopsoas tendons appear normal. There is edema within the left gluteus and adductor muscles. The piriformis muscles appear symmetric. Other findings Miscellaneous: Prominent external iliac lymph nodes are similar to the previous study, likely reactive. The visualized internal pelvic contents otherwise appear unremarkable. IMPRESSION: 1. Significant progression of left hip arthropathy compared with previous MRI of 7 months ago. There is a moderate-sized joint effusion with fluid extending into the iliopsoas bursa, flattening of  the left femoral head, acetabular erosion and surrounding bone marrow and soft tissue edema. Findings are not typical for femoral head osteonecrosis. Consider indolent infection and  Charcot/neuropathic joint. 2. Stable appearance of the right hip and sacroiliac joints. Spondylosis at L5-S1. 3. No focal periarticular fluid collections. Electronically Signed   By: Richardean Sale M.D.   On: 04/21/2022 13:02    Disposition: Discharge disposition: 03-Skilled Nursing Facility       Discharge Instructions     Call MD / Call 911   Complete by: As directed    If you experience chest pain or shortness of breath, CALL 911 and be transported to the hospital emergency room.  If you develope a fever above 101 F, pus (white drainage) or increased drainage or redness at the wound, or calf pain, call your surgeon's office.   Constipation Prevention   Complete by: As directed    Drink plenty of fluids.  Prune juice may be helpful.  You may use a stool softener, such as Colace (over the counter) 100 mg twice a day.  Use MiraLax (over the counter) for constipation as needed.   Diet general   Complete by: As directed    Increase activity slowly as tolerated   Complete by: As directed    Post-operative opioid taper instructions:   Complete by: As directed    POST-OPERATIVE OPIOID TAPER INSTRUCTIONS: It is important to wean off of your opioid medication as soon as possible. If you do not need pain medication after your surgery it is ok to stop day one. Opioids include: Codeine, Hydrocodone(Norco, Vicodin), Oxycodone(Percocet, oxycontin) and hydromorphone amongst others.  Long term and even short term use of opiods can cause: Increased pain response Dependence Constipation Depression Respiratory depression And more.  Withdrawal symptoms can include Flu like symptoms Nausea, vomiting And more Techniques to manage these symptoms Hydrate well Eat regular healthy meals Stay active Use relaxation techniques(deep breathing, meditating, yoga) Do Not substitute Alcohol to help with tapering If you have been on opioids for less than two weeks and do not have pain than it is ok to  stop all together.  Plan to wean off of opioids This plan should start within one week post op of your joint replacement. Maintain the same interval or time between taking each dose and first decrease the dose.  Cut the total daily intake of opioids by one tablet each day Next start to increase the time between doses. The last dose that should be eliminated is the evening dose.      Weight bearing as tolerated   Complete by: As directed    Wear postop shoe on right foot when up.  No hip precautions for left hip.  He had an anterior total hip replacement.   Laterality: bilateral        Follow-up Information     [name], MD. Schedule an appointment as soon as possible for a visit in 2 week(s).   Specialty: Orthopedic Surgery Contact information: [place] [address] [phone]               Right leg dressing can be changed in 5 days and rewrapped with Ace wrap.  Call [phone] at our office if there are any questions.   Signed: Erlene Senters 05/08/2022, 8:33 AM

## 2022-05-08 NOTE — TOC Progression Note (Signed)
Transition of Care [facility]) - Progression Note    Patient Details  Name: [name] MRN: [id number] Date of Birth: 21-Apr-1941  [facility] CM/SW Contact  Servando Snare, [place] Phone Number: 05/08/2022, 11:12 AM  Clinical Narrative:   Patient given bed offers. Awaiting bed choice.     Expected Discharge Plan: Skilled Nursing Facility Barriers to Discharge: SNF Pending bed offer  Expected Discharge Plan and Services Expected Discharge Plan: [place] In-house Referral: Clinical Social Work   Post Acute Care Choice: [facility] Living arrangements for the past 2 months: Single Family Home Expected Discharge Date: 05/08/22                                     Social Determinants of Health (SDOH) Interventions    Readmission Risk Interventions    05/07/2022   11:55 AM 04/22/2022   12:57 PM  Readmission Risk Prevention Plan  Post Dischage Appt  Complete  Medication Screening  Complete  Transportation Screening Complete Complete  PCP or Specialist Appt within 5-7 Days Complete   Home Care Screening Complete   Medication Review (RN CM) Complete

## 2022-05-08 NOTE — Progress Notes (Signed)
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   PMH: CKD, BPH, GERD, HTN, HLD, NM disorder, SOB, back surgery, L-TKA 2016.    PT Comments    Pt is progressing well with therapy. He completed ambulation and exercises. Currently pt requires Min assist for transfers and gait. Pt's limited by muscle weakness and pain. Pt will benefit from skilled therapy by addressing mobility and balance deficits with practicing gait and muscle strengthening via HEP. Recommending SNF for pt to continue therapy and secure living status. Will continue to progress in acute setting.    Recommendations for follow up therapy are one component of a multi-disciplinary discharge planning process, led by the attending physician.  Recommendations may be updated based on patient status, additional functional criteria and insurance authorization.  Follow Up Recommendations  Skilled nursing-short term rehab (<3 hours/day) Can patient physically be transported by private vehicle: Yes   Assistance Recommended at Discharge Intermittent Supervision/Assistance  Patient can return home with the following A little help with walking and/or transfers;A little help with bathing/dressing/bathroom;Assistance with cooking/housework;Assist for transportation;Help with stairs or ramp for entrance   Equipment Recommendations  None recommended by PT    Recommendations for Other Services       Precautions / Restrictions Precautions Precautions: Fall Required Braces or Orthoses: Other Brace Other Brace: Wear Bil post-op shoes Restrictions Weight Bearing Restrictions: No RLE Weight Bearing: Weight bearing as tolerated LLE Weight Bearing: Weight bearing as tolerated     Mobility  Bed  Mobility               General bed mobility comments: pt OOB in recliner    Transfers Overall transfer level: Needs assistance Equipment used: Rolling walker (2 wheels) Transfers: Sit to/from Stand Sit to Stand: Min assist           General transfer comment: Pt attemped once to stand, unable to complete full standing. Second attempt pt required therapist trunk lift assist to complete standing.    Ambulation/Gait Ambulation/Gait assistance: Min guard Gait Distance (Feet): 180 Feet Assistive device: Rolling walker (2 wheels) Gait Pattern/deviations: Step-through pattern, Decreased stride length, Antalgic, Trunk flexed Gait velocity: decr     General Gait Details: Pt needed VC's with RW and head/trunk lift. pt relied on RW for exteneral support throughout gait.   Stairs             Wheelchair Mobility    Modified Rankin (Stroke Patients Only)       Balance Overall balance assessment: Needs assistance Sitting-balance support: Feet unsupported, No upper extremity supported Sitting balance-Leahy Scale: Good Sitting balance - Comments: Pt able to sit to EOC easily.   Standing balance support: Bilateral upper extremity supported, During functional activity, Reliant on assistive device for balance Standing balance-Leahy Scale: Fair Standing balance comment: Pt reported dizziness upon initial standing, able to remain in standing with RW.                            Cognition Arousal/Alertness: Awake/alert Behavior During Therapy: WFL for tasks assessed/performed Overall Cognitive Status: Within Functional Limits for tasks assessed  General Comments: Pt was initially reluctant to do therapy d/t nausea, but complied.        Exercises Total Joint Exercises Ankle Circles/Pumps: AROM, Both, 20 reps, Seated Quad Sets: AROM, Strengthening, 15 reps, Both, Seated Heel Slides: 15 reps, AROM, Left, Seated     General Comments        Pertinent Vitals/Pain Pain Assessment Pain Assessment: 0-10 Pain Score: 5  Pain Location: Lt hip with mobility. Pain Descriptors / Indicators: Discomfort, Sore, Tender Pain Intervention(s): Monitored during session    Home Living                          Prior Function            PT Goals (current goals can now be found in the care plan section) Acute Rehab PT Goals Patient Stated Goal: To go home PT Goal Formulation: With patient Time For Goal Achievement: 05/19/22 Potential to Achieve Goals: Good Progress towards PT goals: Progressing toward goals    Frequency    7X/week      PT Plan Current plan remains appropriate    Co-evaluation              AM-PAC PT "6 Clicks" Mobility   Outcome Measure  Help needed turning from your back to your side while in a flat bed without using bedrails?: A Little Help needed moving from lying on your back to sitting on the side of a flat bed without using bedrails?: A Little Help needed moving to and from a bed to a chair (including a wheelchair)?: A Little Help needed standing up from a chair using your arms (e.g., wheelchair or bedside chair)?: A Little Help needed to walk in hospital room?: A Little Help needed climbing 3-5 steps with a railing? : A Lot 6 Click Score: 17    End of Session Equipment Utilized During Treatment: Gait belt Activity Tolerance: Patient tolerated treatment well Patient left: in chair;with call bell/phone within reach;with chair alarm set Nurse Communication: Mobility status PT Visit Diagnosis: Unsteadiness on feet (R26.81);Other abnormalities of gait and mobility (R26.89);Muscle weakness (generalized) (M62.81);Pain Pain - Right/Left: Left Pain - part of body: Hip     Time: 7262-0355 PT Time Calculation (min) (ACUTE ONLY): 36 min  Charges:  $Gait Training: 8-22 mins $Therapeutic Exercise: 8-22 mins                     Margie Ege, SPT [place] 05/08/2022, 12:20 PM

## 2022-05-08 NOTE — Care Management Important Message (Signed)
Important Message  Patient Details  Name: [name] MRN: [id number] Date of Birth: [birth year]   Medicare Important Message Given:  Yes     Memory Argue 05/08/2022, 1:26 PM

## 2022-05-08 NOTE — Progress Notes (Addendum)
Delayed entry d/t on going patient care.  Patient c/o light headedness, nausea, and dizziness. See VS below. RN offered Zofran. See MAR.  Patient worked with PT and stated that the zofran helped and after working with PT he does feel better but he still feels light headed and nauseous. Patient described it as having a "haze" or "cloudy.". Patient denies chest pain, SOB, and denies back or arm pain. PA notified. No new orders at this time, plan of care on going.      05/08/22 1010  Vitals  BP (!) 150/67  MAP (mmHg) 88  BP Location Left Arm  BP Method Automatic  Patient Position (if appropriate) Sitting  Pulse Rate 80  MEWS COLOR  MEWS Score Color Green  Oxygen Therapy  SpO2 93 %  O2 Device Room Air  Pain Assessment  Pain Scale 0-10  Pain Score 0  MEWS Score  MEWS Temp 0  MEWS Systolic 0  MEWS Pulse 0  MEWS RR 0  MEWS LOC 0  MEWS Score 0

## 2022-05-09 DIAGNOSIS — M2550 Pain in unspecified joint: Secondary | ICD-10-CM | POA: Diagnosis not present

## 2022-05-09 DIAGNOSIS — M6281 Muscle weakness (generalized): Secondary | ICD-10-CM | POA: Diagnosis not present

## 2022-05-09 DIAGNOSIS — Z96642 Presence of left artificial hip joint: Secondary | ICD-10-CM | POA: Diagnosis not present

## 2022-05-09 DIAGNOSIS — Z471 Aftercare following joint replacement surgery: Secondary | ICD-10-CM | POA: Diagnosis not present

## 2022-05-09 DIAGNOSIS — Z7401 Bed confinement status: Secondary | ICD-10-CM | POA: Diagnosis not present

## 2022-05-09 DIAGNOSIS — Z89411 Acquired absence of right great toe: Secondary | ICD-10-CM | POA: Diagnosis not present

## 2022-05-09 DIAGNOSIS — R2243 Localized swelling, mass and lump, lower limb, bilateral: Secondary | ICD-10-CM | POA: Diagnosis not present

## 2022-05-09 DIAGNOSIS — G4730 Sleep apnea, unspecified: Secondary | ICD-10-CM | POA: Diagnosis not present

## 2022-05-09 DIAGNOSIS — R1314 Dysphagia, pharyngoesophageal phase: Secondary | ICD-10-CM | POA: Diagnosis not present

## 2022-05-09 DIAGNOSIS — M1612 Unilateral primary osteoarthritis, left hip: Secondary | ICD-10-CM | POA: Diagnosis not present

## 2022-05-09 DIAGNOSIS — Z741 Need for assistance with personal care: Secondary | ICD-10-CM | POA: Diagnosis not present

## 2022-05-09 DIAGNOSIS — R262 Difficulty in walking, not elsewhere classified: Secondary | ICD-10-CM | POA: Diagnosis not present

## 2022-05-09 DIAGNOSIS — R41841 Cognitive communication deficit: Secondary | ICD-10-CM | POA: Diagnosis not present

## 2022-05-09 DIAGNOSIS — M6259 Muscle wasting and atrophy, not elsewhere classified, multiple sites: Secondary | ICD-10-CM | POA: Diagnosis not present

## 2022-05-09 DIAGNOSIS — Z719 Counseling, unspecified: Secondary | ICD-10-CM | POA: Diagnosis not present

## 2022-05-09 DIAGNOSIS — I872 Venous insufficiency (chronic) (peripheral): Secondary | ICD-10-CM | POA: Diagnosis not present

## 2022-05-09 DIAGNOSIS — R2689 Other abnormalities of gait and mobility: Secondary | ICD-10-CM | POA: Diagnosis not present

## 2022-05-09 NOTE — TOC Transition Note (Signed)
Transition of Care [facility]) - CM/SW Discharge Note  Patient Details  Name: [name] MRN: [id number] Date of Birth: 1941/03/19  [facility] CM/SW Contact:  Sherie Don, LCSW Phone Number: 05/09/2022, 11:59 AM  Clinical Narrative: Patient is medically stable to discharge to [facility] today. Patient will go to room 216 and the number for report is [phone]. Discharge summary, discharge orders, and SNF transfer report faxed to facility in hub. Medical necessity form done; PTAR scheduled. Discharge packet completed. Patient notified of discharge. RN updated. TOC signing off.  Final next level of care: Skilled Nursing Facility Barriers to Discharge: Barriers Resolved  Patient Goals and CMS Choice Patient states their goals for this hospitalization and ongoing recovery are:: return home following rehab CMS Medicare.gov Compare Post Acute Care list provided to:: Patient Choice offered to / list presented to : Patient  Discharge Placement Existing PASRR number confirmed : 05/07/22          Patient chooses bed at: [facility] Patient to be transferred to facility by: PTAR Patient and family notified of of transfer: 05/09/22  Discharge Plan and Services In-house Referral: Clinical Social Work Post Acute Care Choice: [place]           Readmission Risk Interventions    05/07/2022   11:55 AM 04/22/2022   12:57 PM  Readmission Risk Prevention Plan  Post Dischage Appt  Complete  Medication Screening  Complete  Transportation Screening Complete Complete  PCP or Specialist Appt within 5-7 Days Complete   Home Care Screening Complete   Medication Review (RN CM) Complete

## 2022-05-09 NOTE — Plan of Care (Signed)
Pt ready to be transferred to [facility] rehab via [place].

## 2022-05-14 DIAGNOSIS — R2243 Localized swelling, mass and lump, lower limb, bilateral: Secondary | ICD-10-CM | POA: Diagnosis not present

## 2022-05-15 DIAGNOSIS — Z471 Aftercare following joint replacement surgery: Secondary | ICD-10-CM | POA: Diagnosis not present

## 2022-05-15 DIAGNOSIS — I872 Venous insufficiency (chronic) (peripheral): Secondary | ICD-10-CM | POA: Diagnosis not present

## 2022-05-15 DIAGNOSIS — G4730 Sleep apnea, unspecified: Secondary | ICD-10-CM | POA: Diagnosis not present

## 2022-05-15 DIAGNOSIS — Z89411 Acquired absence of right great toe: Secondary | ICD-10-CM | POA: Diagnosis not present

## 2022-05-15 DIAGNOSIS — Z96642 Presence of left artificial hip joint: Secondary | ICD-10-CM | POA: Diagnosis not present

## 2022-05-16 DIAGNOSIS — Z719 Counseling, unspecified: Secondary | ICD-10-CM | POA: Diagnosis not present

## 2022-05-19 DIAGNOSIS — Z89411 Acquired absence of right great toe: Secondary | ICD-10-CM | POA: Diagnosis not present

## 2022-05-19 DIAGNOSIS — I872 Venous insufficiency (chronic) (peripheral): Secondary | ICD-10-CM | POA: Diagnosis not present

## 2022-05-19 DIAGNOSIS — G4730 Sleep apnea, unspecified: Secondary | ICD-10-CM | POA: Diagnosis not present

## 2022-05-19 DIAGNOSIS — Z96642 Presence of left artificial hip joint: Secondary | ICD-10-CM | POA: Diagnosis not present

## 2022-05-19 NOTE — Progress Notes (Signed)
I have been asked to address whether the patient has traumatic or pathologic fracture of the hip.  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  The patient did have osteoarthritis but had a rapidly progressive change in his symptoms when the femoral head collapse.

## 2022-05-21 ENCOUNTER — Telehealth: Payer: Self-pay

## 2022-05-21 ENCOUNTER — Ambulatory Visit: Payer: Medicare Other | Admitting: Internal Medicine

## 2022-05-21 DIAGNOSIS — Z96642 Presence of left artificial hip joint: Secondary | ICD-10-CM | POA: Insufficient documentation

## 2022-05-21 IMAGING — DX DG CHEST 2V
2 series · 2 of 2 positions shown · non-contrast
Comparison: Chest radiograph June 01, 2022

CLINICAL DATA: Cough

EXAM:
CHEST - 2 VIEW

[chest pa]
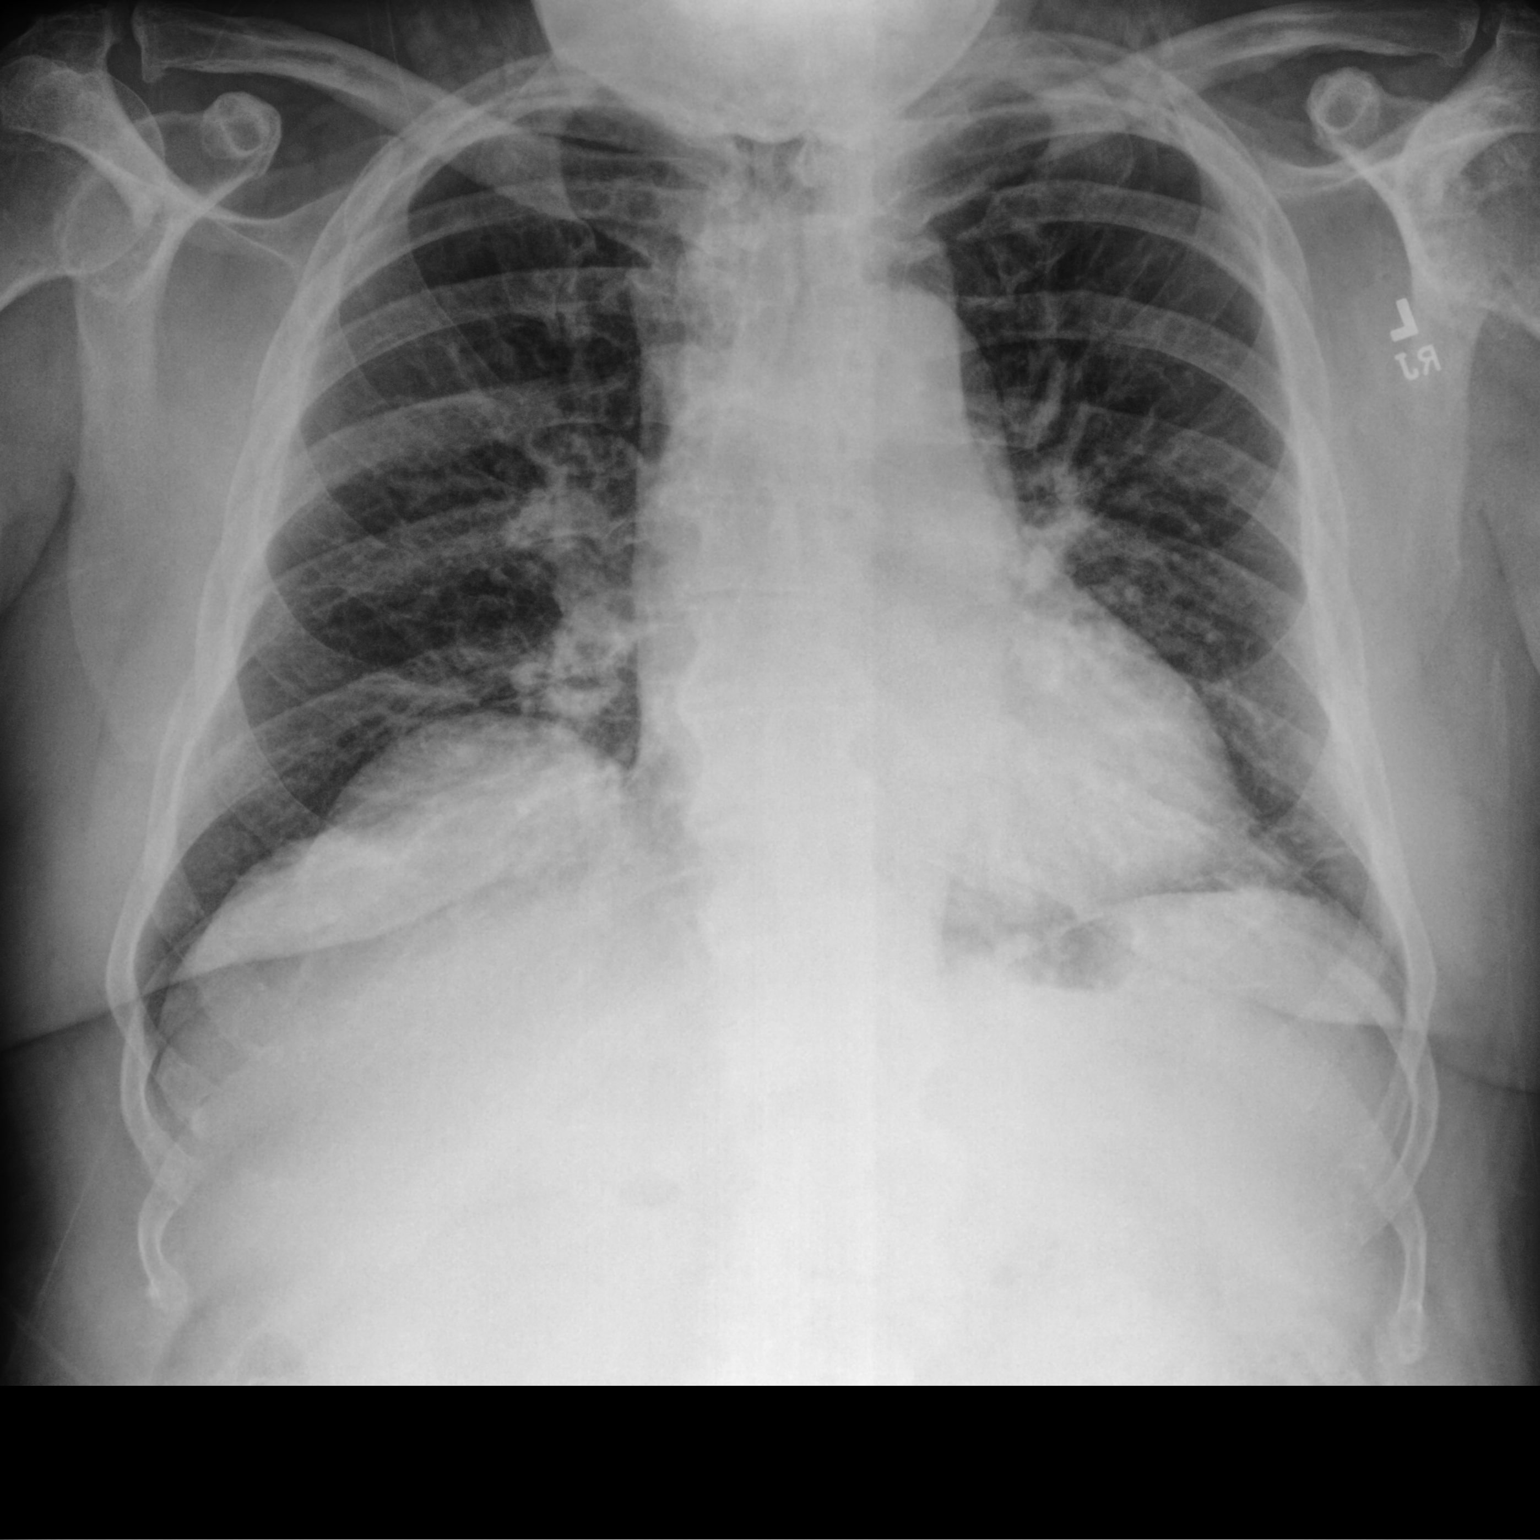

[chest lat]
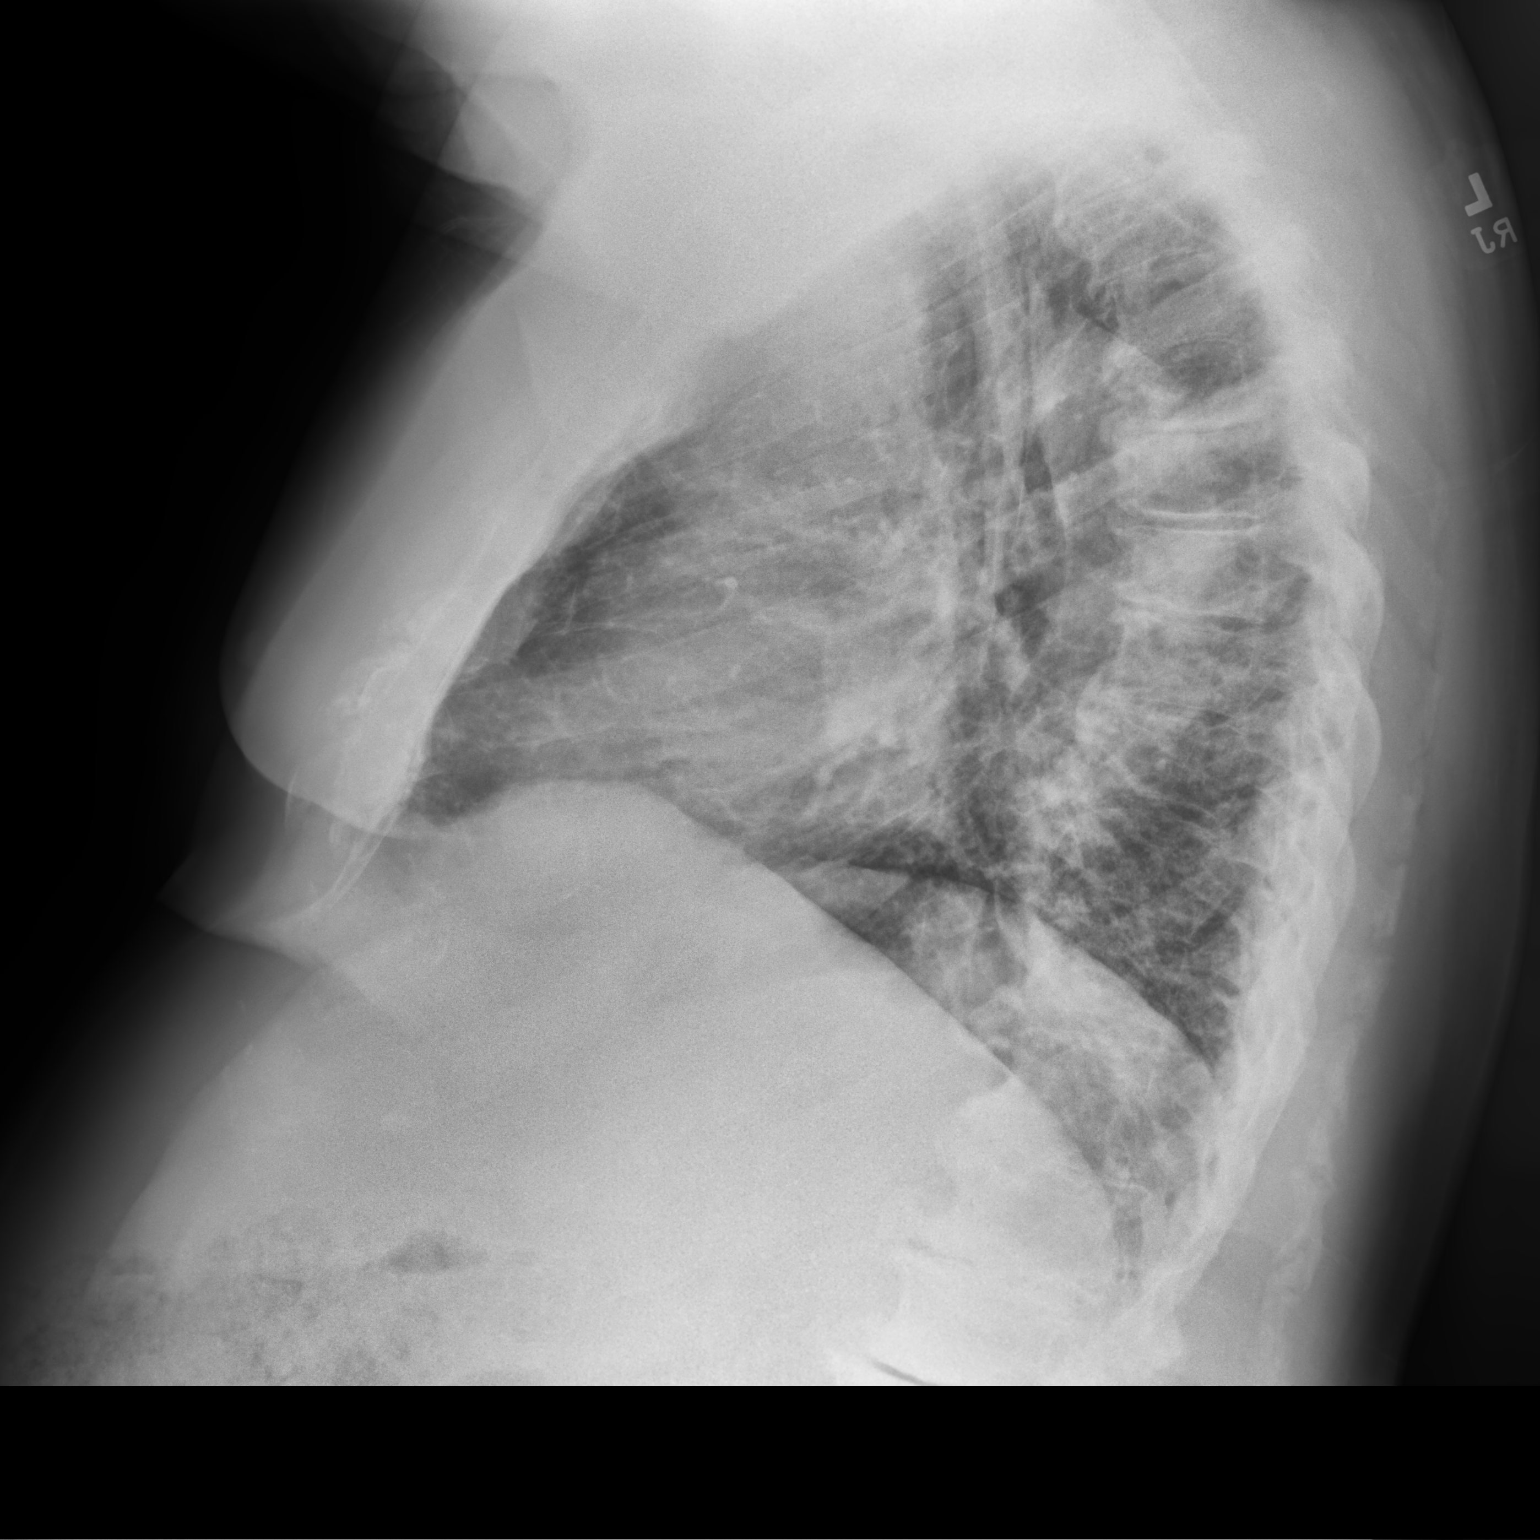

[2 of 2 positions shown; findings below may reference images not displayed]

FINDINGS: The heart size and mediastinal contours are within normal limits. No
focal consolidation. No pleural effusion. No pneumothorax. No acute
osseous abnormality. Thoracic spondylosis.
IMPRESSION: No acute cardiopulmonary disease.

## 2022-05-21 NOTE — Progress Notes (Deleted)
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 male with PMHx as below who presents to the clinic for follow up of right toe OM and recent left THA.   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  He received ~2 weeks of IV antibiotics with ceftriaxone and vancomycin via PICC line through 05/06/22 for this issue and noted to have improvement.  He then went to the OR again with Dr Berenice Primas on 05/05/22 for planned left THA.  There were no OR findings at the time concerning for infection.   As a conservative approach and to hopefully prevent any development of infection involving he hip, he was changed to doxycycline and Augmentin for 2 additional weeks ending on ***.  He was discharged to a SNF and presents today for follow up.  Since discharge, he reports follow up with orthopedics on ***.  He reports ***.    Please see A&P for the details of today's visit and status of the patient's medical problems.   Patient's Medications  New Prescriptions   No medications on file  Previous Medications   ACETAMINOPHEN (TYLENOL) 325 MG TABLET    Take 1-2 tablets (325-650 mg total) by mouth every 6 (six) hours as needed for mild pain (pain score 1-3 or temp > 100.5).   ALBUTEROL (VENTOLIN HFA) 108 (90 BASE) MCG/ACT INHALER    Inhale 2 puffs into the lungs every 6 (six) hours as needed for wheezing or shortness of breath.   AMOXICILLIN-CLAVULANATE (AUGMENTIN) 875-125 MG TABLET    Take 1 tablet by mouth every 12 (twelve) hours.   ASPIRIN EC 325 MG TABLET    Take 1 tablet (325 mg total) by mouth 2 (two) times daily after a meal. Take until one month post op for DVT prophlaxis   BISACODYL (DULCOLAX) 5 MG EC TABLET    Take 1 tablet (5 mg total) by mouth daily as needed for moderate constipation.    BUDESONIDE-FORMOTEROL (SYMBICORT) 160-4.5 MCG/ACT INHALER    Inhale 2 puffs into the lungs 2 (two) times daily.   BUPROPION (WELLBUTRIN XL) 150 MG 24 HR TABLET    Take 450 mg by mouth every morning.   CALCIUM PO    Take 1 tablet by mouth every morning.   DOCUSATE SODIUM (COLACE) 100 MG CAPSULE    Take 1 capsule (100 mg total) by mouth 2 (two) times daily.   DOXAZOSIN (CARDURA) 8 MG TABLET    TAKE 1 TABLET BY MOUTH DAILY  . Take at night   DOXYCYCLINE (VIBRA-TABS) 100 MG TABLET    Take 1 tablet (100 mg total) by mouth every 12 (twelve) hours.   ESOMEPRAZOLE (NEXIUM) 20 MG CAPSULE    Take 20 mg by mouth every morning.   FUROSEMIDE (LASIX) 40 MG TABLET    Take 40 mg by mouth daily.   GABAPENTIN (NEURONTIN) 300 MG CAPSULE    TAKE 1 TO 2 CAPSULES BY MOUTH AT BEDTIME   HYDROCODONE-ACETAMINOPHEN (NORCO) 5-325 MG TABLET    Take 1-2 tablets by mouth every 4 (four) hours as needed for moderate pain (Use sparingly).   METHOCARBAMOL (ROBAXIN) 500 MG TABLET    Take 1 tablet (500 mg total) by mouth every 8 (eight) hours as needed for muscle  spasms.   MIRTAZAPINE (REMERON) 15 MG TABLET    Take 15 mg by mouth at bedtime.   MONTELUKAST (SINGULAIR) 10 MG TABLET    Take 1 tablet (10 mg total) by mouth at bedtime.   MULTIPLE VITAMIN (MULTIVITAMIN WITH MINERALS) TABS TABLET    Take 1 tablet by mouth every morning.   POLYETHYLENE GLYCOL (MIRALAX / GLYCOLAX) 17 G PACKET    Take 17 g by mouth daily as needed for mild constipation.   POTASSIUM CHLORIDE SA (K-DUR,KLOR-CON) 20 MEQ TABLET    Take 1 tablet (20 mEq total) by mouth 2 (two) times daily.   ROSUVASTATIN (CRESTOR) 5 MG TABLET    Take 5 mg by mouth every morning.  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   L4, L5 ,S1   Depression    takes Wellbutrin daily   Dyspnea on exertion    with exertion   GERD (gastroesophageal reflux disease)    takes Nexium and Omeprazole daily   History of kidney stones    HTN (hypertension)    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   Hyperlipidemia    Joint pain    Neuromuscular disorder ([place])    Pre-diabetes    Prediabetes    Sleep apnea    uses CPAP nightly   SOB (shortness of breath)    Swallowing difficulty    Swelling of lower extremity    right more than leg leg   Umbilical hernia     Social History   Tobacco Use   Smoking status: Never   Smokeless tobacco: Never  Vaping Use   Vaping Use: Never used  Substance Use Topics   Alcohol use: Yes    Comment: rare   Drug use: No    Family History  Problem Relation Age of Onset   Thyroid disease Mother    Heart disease Father    Hypertension Father    Diabetes Maternal Grandfather    Colon cancer Neg Hx    Esophageal cancer Neg Hx     No Known Allergies  ROS   OBJECTIVE:    There were no vitals filed for this visit. There is no height or weight on file to calculate BMI.  Physical Exam   Labs and Microbiology:    Latest Ref Rng & Units 05/07/2022    5:14 AM 05/06/2022    4:33 AM 05/05/2022    6:49 AM  CBC  WBC 4.0 - 10.5 K/uL 10.4  15.3  8.0   Hemoglobin 13.0 - 17.0 g/dL 10.6  11.4  14.3   Hematocrit 39.0 - 52.0 % 31.6  33.5  42.8   Platelets 150 - 400 K/uL 142  160  184       Latest Ref Rng & Units 05/08/2022    3:17 AM 05/06/2022    4:33 AM 05/05/2022    6:49 AM  CMP  Glucose 70 - 99 mg/dL 110  175  99   BUN 8 -  23 mg/dL 32  32  30   Creatinine 0.61 - 1.24 mg/dL 1.65  1.51  1.57   Sodium 135 - 145 mmol/L 142  140  141   Potassium 3.5 - 5.1 mmol/L 3.6  4.2  3.5   Chloride 98 - 111 mmol/L 105  103  104   CO2 22 - 32 mmol/L '28   28  27   '$ Calcium 8.9 - 10.3 mg/dL 8.5  8.3  9.2   Total Protein 6.5 - 8.1 g/dL 6.2     Total Bilirubin 0.3 - 1.2 mg/dL 0.7     Alkaline Phos 38 - 126 U/L 115     AST 15 - 41 U/L 48     ALT 0 - 44 U/L 49        No results found for this or any previous visit (from the past 240 hour(s)).  Imaging: ***   ASSESSMENT & PLAN:    No problem-specific Assessment & Plan notes found for this encounter.   No orders of the defined types were placed in this encounter.    There are no diagnoses linked to this encounter.  He has completed appropriate therapy for his right toe OM s/p amputation plus several weeks of empiric antibiotics.  He has no current signs of infection involving the right leg and no issues concerning for infection involving the new left hip prosthesis.  He can follow up as needed.    Raynelle Highland for Infectious Disease [facility] 05/21/2022, 5:31 AM

## 2022-05-21 NOTE — Telephone Encounter (Signed)
Called patient to reschedule missed appointment. No answer. Left HIPAA-compliant voicemail requesting call back. Will also send MyChart message.  Aidenjames Heckmann E Twanda Stakes, RN  

## 2022-05-26 DIAGNOSIS — K219 Gastro-esophageal reflux disease without esophagitis: Secondary | ICD-10-CM | POA: Diagnosis not present

## 2022-05-26 DIAGNOSIS — Z139 Encounter for screening, unspecified: Secondary | ICD-10-CM | POA: Diagnosis not present

## 2022-05-26 DIAGNOSIS — L03115 Cellulitis of right lower limb: Secondary | ICD-10-CM | POA: Diagnosis not present

## 2022-05-26 DIAGNOSIS — Z6837 Body mass index (BMI) 37.0-37.9, adult: Secondary | ICD-10-CM | POA: Diagnosis not present

## 2022-05-26 DIAGNOSIS — M48062 Spinal stenosis, lumbar region with neurogenic claudication: Secondary | ICD-10-CM | POA: Diagnosis not present

## 2022-05-26 DIAGNOSIS — S98111A Complete traumatic amputation of right great toe, initial encounter: Secondary | ICD-10-CM | POA: Diagnosis not present

## 2022-05-26 DIAGNOSIS — M1612 Unilateral primary osteoarthritis, left hip: Secondary | ICD-10-CM | POA: Diagnosis not present

## 2022-05-26 DIAGNOSIS — R1030 Lower abdominal pain, unspecified: Secondary | ICD-10-CM | POA: Diagnosis not present

## 2022-05-26 DIAGNOSIS — R600 Localized edema: Secondary | ICD-10-CM | POA: Diagnosis not present

## 2022-05-26 DIAGNOSIS — R7989 Other specified abnormal findings of blood chemistry: Secondary | ICD-10-CM | POA: Diagnosis not present

## 2022-06-02 ENCOUNTER — Other Ambulatory Visit: Payer: Self-pay

## 2022-06-02 ENCOUNTER — Telehealth: Payer: Self-pay

## 2022-06-02 NOTE — Telephone Encounter (Signed)
   Telephone encounter was:  Unsuccessful.  06/02/2022 Name: WINNER VALERIANO MRN: [id number] DOB: 09-05-1941  Unsuccessful outbound call made today to assist with:  Food Insecurity and housing  Outreach Attempt:  1st Attempt  A HIPAA compliant voice message was left requesting a return call.  Instructed patient to call back at earliest convenience.  [facility] [facility], Care Management  [phone] [address], [place], [place] [address] Phone: [phone] Email: [email or web address]

## 2022-06-03 DIAGNOSIS — M7918 Myalgia, other site: Secondary | ICD-10-CM | POA: Diagnosis not present

## 2022-06-03 DIAGNOSIS — Z471 Aftercare following joint replacement surgery: Secondary | ICD-10-CM | POA: Diagnosis not present

## 2022-06-03 DIAGNOSIS — M545 Low back pain, unspecified: Secondary | ICD-10-CM | POA: Diagnosis not present

## 2022-06-03 DIAGNOSIS — Z96642 Presence of left artificial hip joint: Secondary | ICD-10-CM | POA: Diagnosis not present

## 2022-06-05 ENCOUNTER — Telehealth: Payer: Self-pay | Admitting: *Deleted

## 2022-06-05 ENCOUNTER — Telehealth: Payer: Self-pay

## 2022-06-05 NOTE — Chronic Care Management (AMB) (Signed)
  Care Management   Outreach Note  06/05/2022 Name: [name] MRN: [id number] DOB: July 24, 1941  An unsuccessful telephone outreach was attempted today. The patient was referred to the case management team for assistance with care management and care coordination.   Follow Up Plan:  A HIPAA compliant phone message was left for the patient providing contact information and requesting a return call.  The care management team will reach out to the patient again over the next 7 days.  If patient returns call to provider office, please advise to call [name]* at [phone].*  [place]  Direct Dial: [phone]

## 2022-06-05 NOTE — Telephone Encounter (Signed)
   Telephone encounter was:  Unsuccessful.  06/05/2022 Name: WINTER TREFZ MRN: [id number] DOB: Oct 17, 1941  Unsuccessful outbound call made today to assist with:  Food Insecurity  Outreach Attempt:  2nd Attempt  A HIPAA compliant voice message was left requesting a return call.  Instructed patient to call back at earliest convenience.  [facility], Care Management  [phone] [address], [place], [place] [address] Phone: [phone] Email: [email or web address]

## 2022-06-09 ENCOUNTER — Telehealth: Payer: Self-pay

## 2022-06-09 NOTE — Telephone Encounter (Signed)
   Telephone encounter was:  Unsuccessful.  06/09/2022 Name: [name] MRN: [id number] DOB: [birth year]  Unsuccessful outbound call made today to assist with:  Food Insecurity  Outreach Attempt:  3rd Attempt.  Referral closed unable to contact patient.  A HIPAA compliant voice message was left requesting a return call.  Instructed patient to call back at earliest convenience. [place], Care Management  [phone] [address], [place], [place] [address] Phone: [phone] Email: [email or web address]

## 2022-06-18 ENCOUNTER — Encounter (INDEPENDENT_AMBULATORY_CARE_PROVIDER_SITE_OTHER): Payer: Self-pay

## 2022-06-23 ENCOUNTER — Ambulatory Visit: Payer: Medicare Other | Admitting: Physical Medicine and Rehabilitation

## 2022-06-23 NOTE — Chronic Care Management (AMB) (Unsigned)
  Care Coordination  Outreach Note  06/23/2022 Name: PAXTYN BOYAR MRN: [id number] DOB: 10-29-1941   Care Coordination Outreach Attempts  A second unsuccessful outreach was attempted today to offer the patient with information about available care coordination services as a benefit of their health plan.     Follow Up Plan:  Additional outreach attempts will be made to offer the patient care coordination information and services.   Encounter Outcome:  No Answer  [place]  Direct Dial: [phone]

## 2022-06-26 NOTE — Chronic Care Management (AMB) (Signed)
  Care Coordination  Outreach Note  06/26/2022 Name: SIDNEY KANN MRN: [id number] DOB: 19-May-1941   Care Coordination Outreach Attempts  A third unsuccessful outreach was attempted today to offer the patient with information about available care coordination services as a benefit of their health plan.   Follow Up Plan:  No further outreach attempts will be made at this time. We have been unable to contact the patient to offer or enroll patient in care coordination services  Encounter Outcome:  No Answer  [place]: [phone]

## 2022-07-02 ENCOUNTER — Encounter: Payer: Self-pay | Admitting: Pulmonary Disease

## 2022-07-02 ENCOUNTER — Ambulatory Visit (INDEPENDENT_AMBULATORY_CARE_PROVIDER_SITE_OTHER): Payer: Medicare Other | Admitting: Pulmonary Disease

## 2022-07-02 ENCOUNTER — Ambulatory Visit (INDEPENDENT_AMBULATORY_CARE_PROVIDER_SITE_OTHER): Payer: Medicare Other

## 2022-07-02 VITALS — BP 140/78 | HR 90 | Temp 98.3°F | Ht 68.0 in | Wt 252.6 lb

## 2022-07-02 DIAGNOSIS — R0602 Shortness of breath: Secondary | ICD-10-CM

## 2022-07-02 DIAGNOSIS — J9811 Atelectasis: Secondary | ICD-10-CM | POA: Diagnosis not present

## 2022-07-02 MED ORDER — PREDNISONE 20 MG PO TABS: ORAL_TABLET | ORAL | 0 refills | Status: DC

## 2022-07-02 MED ORDER — AZITHROMYCIN 250 MG PO TABS: ORAL_TABLET | ORAL | 0 refills | Status: DC

## 2022-07-02 NOTE — Progress Notes (Signed)
[name]    [id number]    05-17-1941  Primary Care Physician:McNeill, Abigail Butts, MD  Referring Physician: Cari Caraway, MD [place] [id number]   Chief complaint: Follow up for dyspnea  HPI: 81 year old with history of sleep apnea, allergic rhinitis, hyperlipidemia Referred for evaluation of dyspnea.  Symptoms have been going on for the past 2 years.  He has dyspnea on exertion and sometimes at rest.  Associated with wheeze, chest tightness.  Currently on albuterol inhaler.  Previously had tried Advair but cannot tell if it made a difference.  He stopped using it as it was too expensive.  Has seasonal allergies, significant issues with GERD and is on Nexium.  History also noted for colonic polyps.  Previously followed by Dr. Benson Norway Reports seeing blood in the stool recently and intermittent right upper quadrant pain.  Follows with Dr. Gwenlyn Found, cardiology with normal cardiac catheterization in 2008.  Echocardiogram and stress test in 2018 within normal limits.  Symptoms not felt to be related to the heart.  Has bilateral lower extremity edema which is attributed to venous insufficiency.  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  Reports significant basement dampness and flooding at home with mold issues.  No hot tub, Jacuzzi, down pillows, comforters Smoking history: Never smoker Travel history: No significant travel history Relevant family history: No significant family history of lung disease  Interim history: He had a chest x-ray on 6/19 at skilled nursing facility showing mild lower lobe opacity suggestive of infection/atelectasis.  Follow-up chest x-ray on 6/28 did not show any acute  abnormality.  Continues on Symbicort.  States that over the past few weeks he has increasing sinus congestion, postnasal drip and cough with yellow/green mucus with worsening dyspnea, wheezing.  Outpatient Encounter Medications as of 07/02/2022  Medication Sig   acetaminophen (TYLENOL) 325 MG tablet Take 1-2 tablets (325-650 mg total) by mouth every 6 (six) hours as needed for mild pain (pain score 1-3 or temp > 100.5).   albuterol (VENTOLIN HFA) 108 (90 Base) MCG/ACT inhaler Inhale 2 puffs into the lungs every 6 (six) hours as needed for wheezing or shortness of breath.   aspirin EC 325 MG tablet Take 1 tablet (325 mg total) by mouth 2 (two) times daily after a meal. Take until one month post op for DVT prophlaxis   bisacodyl (DULCOLAX) 5 MG EC tablet Take 1 tablet (5 mg total) by mouth daily as needed for moderate constipation.   budesonide-formoterol (SYMBICORT) 160-4.5 MCG/ACT inhaler Inhale 2 puffs into the lungs 2 (two) times daily.   buPROPion (WELLBUTRIN XL) 150 MG 24 hr tablet Take 450 mg by mouth every morning.   CALCIUM PO Take 1 tablet by mouth every morning.   docusate sodium (COLACE) 100 MG capsule Take 1 capsule (100 mg total) by mouth 2 (two) times daily.   doxazosin (CARDURA) 8 MG tablet TAKE 1 TABLET BY MOUTH DAILY  . Take at night (Patient taking differently: Take 8 mg by mouth at bedtime.)   esomeprazole (NEXIUM) 20 MG capsule Take 20 mg by mouth every morning.   furosemide (LASIX) 40 MG tablet Take 40 mg by mouth daily.   gabapentin (NEURONTIN) 300 MG capsule  TAKE 1 TO 2 CAPSULES BY MOUTH AT BEDTIME (Patient taking differently: Take 600 mg by mouth at bedtime.)   HYDROcodone-acetaminophen (NORCO) 5-325 MG tablet Take 1-2 tablets by mouth every 4 (four) hours as needed for moderate pain (Use sparingly).   methocarbamol (ROBAXIN) 500 MG tablet Take 1 tablet (500 mg total) by mouth every 8 (eight) hours as needed for muscle spasms. (Patient taking differently: Take 500 mg by mouth  in the morning and at bedtime.)   mirtazapine (REMERON) 15 MG tablet Take 15 mg by mouth at bedtime.   montelukast (SINGULAIR) 10 MG tablet Take 1 tablet (10 mg total) by mouth at bedtime.   Multiple Vitamin (MULTIVITAMIN WITH MINERALS) TABS tablet Take 1 tablet by mouth every morning.   polyethylene glycol (MIRALAX / GLYCOLAX) 17 g packet Take 17 g by mouth daily as needed for mild constipation.   potassium chloride SA (K-DUR,KLOR-CON) 20 MEQ tablet Take 1 tablet (20 mEq total) by mouth 2 (two) times daily.   rosuvastatin (CRESTOR) 5 MG tablet Take 5 mg by mouth every morning.   [DISCONTINUED] amoxicillin-clavulanate (AUGMENTIN) 875-125 MG tablet Take 1 tablet by mouth every 12 (twelve) hours.   [DISCONTINUED] doxycycline (VIBRA-TABS) 100 MG tablet Take 1 tablet (100 mg total) by mouth every 12 (twelve) hours.   No facility-administered encounter medications on file as of 07/02/2022.    Allergies as of 07/02/2022   (No Known Allergies)   Physical Exam: Blood pressure (!) 140/78, pulse 90, temperature 98.3 F (36.8 C), temperature source Oral, height '5\' 8"'$  (1.727 m), weight 252 lb 9.6 oz (114.6 kg), SpO2 98 %. Gen:      No acute distress HEENT:  EOMI, sclera anicteric Neck:     No masses; no thyromegaly Lungs:    Scattered expiratory wheeze CV:         Regular rate and rhythm; no murmurs Abd:      + bowel sounds; soft, non-tender; no palpable masses, no distension Ext:    No edema; adequate peripheral perfusion Skin:      Warm and dry; no rash Neuro: alert and oriented x 3 Psych: normal mood and affect   Data Reviewed: Imaging: CTA 08/13/2018 no pulmonary embolism.  Bronchial wall thickening.  Dependent atelectasis.  CT chest 08/16/2019-mild dependent atelectasis, No evidence of interstitial lung disease. I have reviewed the images personally.  CTA 10/18/2019-no pulmonary embolism or acute disease  Chest x-ray 04/05/2021-chronic interstitial and bronchitic changes  CTA  12/02/2021-no pulmonary embolism, no acute abnormalities of the lungs.  Chest x-ray 05/07/2022-no evidence of acute cardiopulmonary abnormality. I have reviewed the images personally.  PFTs  11/25/2019 FVC 3.21 [82%], FEV1 2.61 [93%], F/F 81, TLC 5.93 [87%], DLCO 21.42 [90%] Normal test.  Cardiac: Echocardiogram 02/18/2017- LVEF 87-56%, grade 1 diastolic dysfunction  Nuclear stress test 02/19/2017-mild diffuse hypokinesis, EF 45%.  Low risk study with no ischemia identified.   Labs: CMP 08/05/2019-significant for creatinine of 1.64, hepatic panel within normal limits CBC FasTRACT on 7/22-WBC 6.1, eos 3%, absolute eosinophil count 183 IgE 08/05/2019-89 Hypersensitivity panel 08/05/2019-negative  BNP 04/05/2021-24  SARS-CoV-2 09/06/2019-negative  Sleep PSG 12/01/2018- severe sleep apnea with AHI 90.6 desaturations to 76%. CPAP titration/2/20- recommend trial CPAP on 11  Assessment:  Mild asthma Suspect reactive airway disease, asthma, with contribution from deconditioning, body habitus.  He has significant mold exposure at home but no evidence of hypersensitivity pneumonitis on labs or CT scan  Appears to have a mild exacerbation secondary to rhinosinusitis.  We will get a  chest x-ray today Treat with Z-Pak and prednisone 40 mg a day for 5 days Continue Symbicort He will benefit from weight loss with diet and exercise but his mobility is limited with back issues, spinal stenosis and orthopedic issues  GERD Continue Nexium  Severe Obstructive sleep apnea Compliant with CPAP  Plan/Recommendations: - Chest x-ray - Z-Pak, prednisone - Symbicort twice daily  Marshell Garfinkel MD [facility] 07/02/2022, 10:36 AM  CC: Cari Caraway, MD

## 2022-07-02 NOTE — Patient Instructions (Signed)
We will get chest x-ray today Prescribe Z-Pak and prednisone 40 mg a day for 5 days Continue the inhalers You can use Mucinex over-the-counter and Zyrtec for nasal congestion Follow-up in 6 months.

## 2022-07-04 DIAGNOSIS — E291 Testicular hypofunction: Secondary | ICD-10-CM | POA: Diagnosis not present

## 2022-07-04 DIAGNOSIS — N200 Calculus of kidney: Secondary | ICD-10-CM | POA: Diagnosis not present

## 2022-07-04 DIAGNOSIS — N401 Enlarged prostate with lower urinary tract symptoms: Secondary | ICD-10-CM | POA: Diagnosis not present

## 2022-07-04 DIAGNOSIS — R3915 Urgency of urination: Secondary | ICD-10-CM | POA: Diagnosis not present

## 2022-07-08 ENCOUNTER — Encounter: Payer: Self-pay | Admitting: Physical Medicine and Rehabilitation

## 2022-07-08 ENCOUNTER — Ambulatory Visit (INDEPENDENT_AMBULATORY_CARE_PROVIDER_SITE_OTHER): Payer: Medicare Other | Admitting: Physical Medicine and Rehabilitation

## 2022-07-08 DIAGNOSIS — M48062 Spinal stenosis, lumbar region with neurogenic claudication: Secondary | ICD-10-CM

## 2022-07-08 DIAGNOSIS — M5416 Radiculopathy, lumbar region: Secondary | ICD-10-CM | POA: Diagnosis not present

## 2022-07-08 DIAGNOSIS — M21371 Foot drop, right foot: Secondary | ICD-10-CM | POA: Diagnosis not present

## 2022-07-08 DIAGNOSIS — M47816 Spondylosis without myelopathy or radiculopathy, lumbar region: Secondary | ICD-10-CM | POA: Diagnosis not present

## 2022-07-08 NOTE — Progress Notes (Unsigned)
Pt state lower back pain that travels to his right hip, leg and toes. Pt state any movement makes the pain worse. Pt state he takes pain meds to help ease his pain.  Numeric Pain Rating Scale and Functional Assessment Average Pain 10 Pain Right Now 8 My pain is intermittent, constant, sharp, burning, stabbing, tingling, and aching Pain is worse with: walking, bending, sitting, standing, some activites, and laying down Pain improves with: heat/ice, medication, and injections   In the last MONTH (on 0-10 scale) has pain interfered with the following?  1. General activity like being  able to carry out your everyday physical activities such as walking, climbing stairs, carrying groceries, or moving a chair?  Rating(5)  2. Relation with others like being able to carry out your usual social activities and roles such as  activities at home, at work and in your community. Rating(6)  3. Enjoyment of life such that you have  been bothered by emotional problems such as feeling anxious, depressed or irritable?  Rating(7)

## 2022-07-08 NOTE — Progress Notes (Unsigned)
[name] - 81 y.o. male MRN [id number]  Date of birth: 15-Dec-1940  Office Visit Note: Visit Date: 07/08/2022 PCP: Cari Caraway, MD Referred by: Cari Caraway, MD  Subjective: Chief Complaint  Patient presents with   Lower Back - Pain   Right Leg - Pain   HPI: [name] is a 81 y.o. male who comes in today for evaluation of chronic, worsening and severe bilateral lower back pain radiating down right leg. Pain ongoing for several years and is exacerbated by prolonged standing/walking. He describes pain as burning, throbbing and tingling in nature, currently rates as 8 out of 10. Patient reports some relief of pain with home exercise regimen, rest and use of medications. Patient is currently taking 5-325 mg of Norco for moderate/severe pain that is prescribed by his primary care provider Dr. Cari Caraway. Patient's CT lumbar myelogram from 2021 exhibits multilevel facet hypertrophy and severe spinal canal stenosis at the levels of L3-L4 and L4-L5. Patient has undergone multiple lumbar epidural steroid injections in our office over the years that have significantly helped to alleviate pain. Most recent was bilateral L4 transforaminal epidural steroid injection on 03/20/2022, reports greater than 80% relief of pain for several weeks with this procedure. Patient has a known right foot drop that has been ongoing for several years. Patient was previously treated by Dr. Kristeen Miss at [facility]. Per the patient Dr. Ellene Route did recommend surgical intervention, however patient did not wish to proceed at that time. Patient recently underwent left total hip arthoplasty by Dr. [name] on 05/05/2022, states his recovery is going well. Patient currently using cane to assist with ambulation. He denies recent trauma or falls.     Review of Systems  Musculoskeletal:  Positive for back pain.  Neurological:  Positive for tingling, sensory change, focal weakness and weakness.  All  other systems reviewed and are negative.  Otherwise per HPI.  Assessment & Plan: Visit Diagnoses:    ICD-10-CM   1. Lumbar radiculopathy  M54.16 Ambulatory referral to Physical Medicine Rehab    2. Spinal stenosis of lumbar region with neurogenic claudication  M48.062 Ambulatory referral to Physical Medicine Rehab    3. Facet hypertrophy of lumbar region  M47.816     4. Right foot drop  M21.371 Ambulatory referral to Physical Medicine Rehab       Plan: Findings:  Chronic, worsening and severe bilateral lower back pain radiating down right leg. Patient continues to have severe pain despite good conservative therapies such as home exercise regimen, rest and use of medications. Patient's clinical presentation and exam are consistent with neurogenic claudication as a result of spinal canal stenosis. There is severe spinal canal stenosis at the levels of L3-L4 and L4-L5. Next step is to perform diagnostic and hopefully therapeutic right L5-S1 interlaminar epidural steroid injection under fluoroscopic guidance. He is not currently taking anticoagulants. Unfortunately right foot drop is unlikely to resolve due to nerve damage. We did discuss surgical intervention, however patient is not interested at this time. Patient is not currently using AFO brace and is not interested in this treatment. No new red flag symptoms noted upon exam today.     Meds & Orders: No orders of the defined types were placed in this encounter.   Orders Placed This Encounter  Procedures   Ambulatory referral to Physical Medicine Rehab    Follow-up: Return for Right L5-S1 interlaminar epidural steroid injection.   Procedures: No procedures performed  Clinical History: CT LUMBAR MYELOGRAM FINDINGS:     T10-11: No stenosis. Facet degeneration and ligamentous  calcification. Solid bridging anterior osteophytes.     T11-12: No stenosis.     T12-L1: Mild bulging of the disc. No compressive stenosis. Conus tip   at this level.     L1-2: 2 mm retrolisthesis. Moderate multifactorial spinal stenosis.  Disc space narrowing. Endplate osteophytes. Facet and ligamentous  hypertrophy. Foraminal stenosis on both sides that could compress  the L1 nerves.     L2-3: 2 mm retrolisthesis. Chronic disc degeneration with loss of  disc height and vacuum phenomenon. Endplate osteophytes. Facet and  ligamentous hypertrophy. Severe multifactorial spinal stenosis  likely to cause neural compression. Bilateral foraminal stenosis  that could compress the L2 nerves.     L3-4: 2 mm retrolisthesis. Endplate osteophytes and bulging of the  disc. Facet and ligamentous hypertrophy. Severe multifactorial  spinal stenosis likely to cause neural compression. Bilateral  foraminal stenosis that could affect the exiting L3 nerves.     L4-5: Chronic facet arthropathy with anterolisthesis of 1 cm. Disc  degeneration with disc space narrowing and vacuum phenomenon.  Endplate osteophytes and bulging of the disc. Severe stenosis of the  central canal and both neural foramina likely to cause neural  compression.     L5-S1: Chronic disc degeneration with loss of disc height and vacuum  phenomenon. Endplate osteophytes. Facet hypertrophy and degeneration  with some ligamentous calcification. Severe stenosis of the  subarticular lateral recesses and neural foramina.     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 Curvature convex to the left in the thoracolumbar  junction region and towards the right in the lower lumbar region. 2  mm retrolisthesis L1-2, L2-3 and L3-4. 1 cm anterolisthesis L4-5.     Severe multifactorial spinal stenosis and neural foraminal stenosis  likely to cause neural compression at L2-3, L3-4, L4-5 and L5-S1.  Moderate multifactorial canal stenosis at L1-2. Bilateral foraminal  stenosis at that level.        Electronically Signed    By: Nelson Chimes  M.D.    On: 12/06/2019 10:12   He reports that he has never smoked. He has never used smokeless tobacco.  Recent Labs    04/16/22 1036  HGBA1C 5.7*    Objective:  VS:  HT:    WT:   BMI:     BP:   HR: bpm  TEMP: ( )  RESP:  Physical Exam Vitals and nursing note reviewed.  HENT:     Head: Normocephalic and atraumatic.     Right Ear: External ear normal.     Left Ear: External ear normal.     Nose: Nose normal.     Mouth/Throat:     Mouth: Mucous membranes are moist.  Eyes:     Extraocular Movements: Extraocular movements intact.  Cardiovascular:     Rate and Rhythm: Normal rate.     Pulses: Normal pulses.  Pulmonary:     Effort: Pulmonary effort is normal.  Abdominal:     General: Abdomen is flat. There is no distension.  Musculoskeletal:        General: Tenderness present.     Cervical back: Normal range of motion.     Comments: Pt is slow to rise from seated position to standing. Good lumbar range of motion. Weakness noted with plantarflexion and dorsiflexion of right foot. no pain upon palpation of greater trochanters. Sensation intact bilaterally. Ambulates with  cane, gait slow and unsteady.  Skin:    General: Skin is warm and dry.     Capillary Refill: Capillary refill takes less than 2 seconds.  Neurological:     Mental Status: He is alert and oriented to person, place, and time.     Motor: Weakness present.     Gait: Gait abnormal.  Psychiatric:        Mood and Affect: Mood normal.        Behavior: Behavior normal.     Ortho Exam  Imaging: No results found.  Past Medical/Family/Surgical/Social History: Medications & Allergies reviewed per EMR, new medications updated. Patient Active Problem List   Diagnosis Date Noted   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/2022   Primary osteoarthritis of left hip 04/23/2022   Osteomyelitis of great toe of right foot ([place]) 04/18/2022   Left hip pain 04/09/2022   Upper airway cough syndrome 02/03/2022   Acute  rhinosinusitis 01/24/2022   Hypertension 09/30/2021   Allergic rhinitis due to pollen 03/25/2021   Benign prostatic hyperplasia with lower urinary tract symptoms 03/25/2021   Chronic fatigue syndrome 03/25/2021   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   Chronic kidney disease, stage 3 unspecified ([place]) 03/25/2021   Chronic pain 03/25/2021   Dyslipidemia 03/25/2021   Edema 03/25/2021   Generalized anxiety disorder 03/25/2021   Mild persistent asthma 03/25/2021   Moderate recurrent major depression ([place]) 03/25/2021   Morbid obesity ([place]) 03/25/2021   Non-pressure chronic ulcer of other part of right foot limited to breakdown of skin ([place]) 03/25/2021   Prediabetes 03/25/2021   Primary insomnia 03/25/2021   Sleep disturbance 03/25/2021   Paresthesia of skin 06/08/2020   Right foot drop 06/08/2020   Body mass index (BMI) 35.0-35.9, adult 03/13/2020   Spondylosis of cervical region without myelopathy or radiculopathy 01/05/2020   Elevated blood-pressure reading, without diagnosis of hypertension 12/07/2019   Spinal stenosis of lumbar region with neurogenic claudication 01/15/2017   Spondylolisthesis of lumbar region 01/15/2017   Claw toe, acquired, left 01/06/2017   Achilles tendon contracture, bilateral 01/06/2017   Pain in metatarsus of both feet 01/06/2017   Tubular adenoma of colon 12/29/2016   Basal cell carcinoma 08/65/7846   Umbilical hernia s/p lap repair with mesh 06/27/2016 06/27/2016   Degenerative arthritis of knee 03/22/2015   Dyspnea on exertion 01/31/2015   Lumbar radiculopathy 12/12/2014   Facet hypertrophy of lumbar region 12/12/2014   Left knee pain 12/12/2014   Ulcer of lower limb ([place]) 01/10/2014   Varicose veins of lower extremities with other complications 96/29/5284   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 12/21/2013   Renal insufficiency 12/21/2013   Swelling in head/neck 07/28/2012   Venous (peripheral) insufficiency 05/25/2012   Hypogonadism male  03/16/2012   Pain in limb 02/12/2012   Hyperlipidemia 02/28/2008   Allergic rhinitis 02/28/2008   Sleep apnea 02/28/2008   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, L5 ,S1   Depression    takes Wellbutrin daily   Dyspnea on exertion    with exertion   GERD (gastroesophageal reflux disease)  takes Nexium and Omeprazole daily   History of kidney stones    HTN (hypertension)    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   Hyperlipidemia    Joint pain    Neuromuscular disorder ([place])    Pre-diabetes    Prediabetes    Sleep apnea    uses CPAP nightly   SOB (shortness of breath)    Swallowing difficulty    Swelling of lower extremity    right more than leg leg   Umbilical hernia    Family History  Problem Relation Age of Onset   Thyroid disease Mother    Heart disease Father    Hypertension Father    Diabetes Maternal Grandfather    Colon cancer Neg Hx    Esophageal cancer Neg Hx    Past Surgical History:  Procedure Laterality Date   ABDOMINAL AORTOGRAM W/LOWER EXTREMITY N/A 03/14/2020   Procedure: ABDOMINAL AORTOGRAM W/LOWER EXTREMITY;  Surgeon: Serafina Mitchell, MD;  Location: [facility];  Service: Vascular;  Laterality: N/A;   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   AMPUTATION TOE Right 04/18/2022   Procedure: AMPUTATION RIGHT GREAT TOE;  Surgeon: [name], MD;  Location: [facility];  Service: Orthopedics;  Laterality: Right;   BACK SURGERY  2003   L4, L5   BACK SURGERY     CATARACT EXTRACTION, BILATERAL     ENDOVENOUS ABLATION SAPHENOUS VEIN W/ LASER Right 06-01-2014   EVLA right greater saphenous vein by Curt Jews MD    epidural steroid injections         [facility]   hernia repair  2003   HERNIA REPAIR     hernia inguinal x 2   TOTAL HIP ARTHROPLASTY Left 05/05/2022   Procedure: LEFT TOTAL HIP ARTHROPLASTY ANTERIOR APPROACH;  Surgeon: [name], MD;  Location: [facility];  Service: Orthopedics;  Laterality: Left;   TOTAL KNEE ARTHROPLASTY Left 03/22/2015   Procedure: TOTAL KNEE ARTHROPLASTY;  Surgeon: Meredith Pel, MD;  Location: [facility];  Service: Orthopedics;  Laterality: Left;   UMBILICAL HERNIA REPAIR N/A 06/27/2016   Procedure: LAPAROSCOPIC ASSISTED REPAIR OF UMBILICAL HERINA WITH MESH;  Surgeon: Johnathan Hausen, MD;  Location: [facility];  Service: General;  Laterality: N/A;   VASCULAR SURGERY  2015   right leg   Social History   Occupational History   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

## 2022-07-10 ENCOUNTER — Encounter (HOSPITAL_BASED_OUTPATIENT_CLINIC_OR_DEPARTMENT_OTHER): Payer: Medicare Other | Attending: General Surgery | Admitting: General Surgery

## 2022-07-10 DIAGNOSIS — N183 Chronic kidney disease, stage 3 unspecified: Secondary | ICD-10-CM | POA: Insufficient documentation

## 2022-07-10 DIAGNOSIS — L89512 Pressure ulcer of right ankle, stage 2: Secondary | ICD-10-CM | POA: Insufficient documentation

## 2022-07-10 DIAGNOSIS — T8781 Dehiscence of amputation stump: Secondary | ICD-10-CM | POA: Diagnosis not present

## 2022-07-10 DIAGNOSIS — Z96642 Presence of left artificial hip joint: Secondary | ICD-10-CM | POA: Diagnosis not present

## 2022-07-10 DIAGNOSIS — L97322 Non-pressure chronic ulcer of left ankle with fat layer exposed: Secondary | ICD-10-CM | POA: Diagnosis not present

## 2022-07-10 DIAGNOSIS — X58XXXA Exposure to other specified factors, initial encounter: Secondary | ICD-10-CM | POA: Insufficient documentation

## 2022-07-10 DIAGNOSIS — L97512 Non-pressure chronic ulcer of other part of right foot with fat layer exposed: Secondary | ICD-10-CM | POA: Insufficient documentation

## 2022-07-10 DIAGNOSIS — I872 Venous insufficiency (chronic) (peripheral): Secondary | ICD-10-CM | POA: Diagnosis not present

## 2022-07-10 DIAGNOSIS — Y835 Amputation of limb(s) as the cause of abnormal reaction of the patient, or of later complication, without mention of misadventure at the time of the procedure: Secondary | ICD-10-CM | POA: Insufficient documentation

## 2022-07-10 DIAGNOSIS — S90512A Abrasion, left ankle, initial encounter: Secondary | ICD-10-CM | POA: Insufficient documentation

## 2022-07-10 DIAGNOSIS — S91301A Unspecified open wound, right foot, initial encounter: Secondary | ICD-10-CM | POA: Diagnosis not present

## 2022-07-10 DIAGNOSIS — I129 Hypertensive chronic kidney disease with stage 1 through stage 4 chronic kidney disease, or unspecified chronic kidney disease: Secondary | ICD-10-CM | POA: Diagnosis not present

## 2022-07-10 NOTE — Progress Notes (Signed)
[name], [name] ([id number]) Visit Report for 07/10/2022 Abuse Risk Screen Details [name], [name] 07/10/2022 1:30 PM Medical Record Number: [id number] Patient Account Number: [id number] Date of Birth/Sex: Treating RN: [birth year] (81 y.o. Waldron Session Primary Care Tracey Stewart: [name] Other Clinician: Referring Evangelyne Loja: Treating Licet Dunphy/Extender: Benedict Needy Weeks in Treatment: 0 Abuse Risk Screen Items Answer ABUSE RISK SCREEN: Has anyone close to you tried to hurt or harm you recentlyo No Do you feel uncomfortable with anyone in your familyo No Has anyone forced you do things that you didnt want to doo No Electronic Signature(s) Signed: 07/10/2022 5:00:23 PM By: Blanche East RN Entered By: Blanche East on 07/10/2022 14:19:21 -------------------------------------------------------------------------------- Activities of Daily Living Details [name], [name] 07/10/2022 1:30 PM Medical Record Number: [id number] Patient Account Number: [id number] Date of Birth/Sex: Treating RN: 1940-12-22 (81 y.o. Waldron Session Primary Care Ottavio Norem: [name] Other Clinician: Referring Jaquanda Wickersham: Treating Tonette Koehne/Extender: Benedict Needy Weeks in Treatment: 0 Activities of Daily Living Items Answer Activities of Daily Living (Please select one for each item) Drive Automobile Completely Able T Medications ake Completely Able Use T elephone Completely Able Care for Appearance Completely Able Use T oilet Completely Able Bath / Shower Completely Able Dress Self Completely Able Feed Self Completely Able Walk Need Assistance Get In / Out Bed Completely Able Housework Completely Able Prepare Meals Completely [place] for Self Completely Able Electronic Signature(s) Signed: 07/10/2022 5:00:23 PM By: Blanche East RN Entered By: Blanche East on 07/10/2022 14:21:32 -------------------------------------------------------------------------------- Education Screening Details Patient Name: [name]: [name] Banker [name] 07/10/2022 1:30 PM Medical Record Number: [id number] Patient Account Number: [id number] Date of Birth/Sex: Treating RN: 1941-04-07 (81 y.o. Waldron Session Primary Care Nya Monds: [name] Other Clinician: Referring Katelin Kutsch: Treating Kejon Feild/Extender: Benedict Needy Weeks in Treatment: 0 Primary Learner Assessed: Patient Learning Preferences/Education Level/Primary Language Learning Preference: Explanation Preferred Language: English Cognitive Barrier Language Barrier: No Translator Needed: No Memory Deficit: No Emotional Barrier: No Cultural/Religious Beliefs Affecting Medical Care: No Physical Barrier Impaired Vision: No Impaired Hearing: No Decreased Hand dexterity: No Knowledge/Comprehension Knowledge Level: Medium Comprehension Level: Medium Ability to understand written instructions: Medium Ability to understand verbal instructions: Medium Motivation Anxiety Level: Calm Cooperation: Cooperative Education Importance: Acknowledges Need Interest in Health Problems: Asks Questions Perception: Coherent Willingness to Engage in Self-Management High Activities: Readiness to Engage in Self-Management High Activities: Electronic Signature(s) Signed: 07/10/2022 5:00:23 PM By: Blanche East RN Entered By: Blanche East on 07/10/2022 14:22:18 -------------------------------------------------------------------------------- Fall Risk Assessment Details Patient Name: [name]: [name] Banker [name] 07/10/2022 1:30 PM Medical Record Number: [id number] Patient Account Number: [id number] Date of Birth/Sex: Treating RN: Jan 17, 1941 (81 y.o. Waldron Session Primary Care Jalaysia Lobb: [name] Other Clinician: Referring Maleia Weems: Treating Janai Maudlin/Extender:  Benedict Needy Weeks in Treatment: 0 Fall Risk Assessment Items Have you had 2 or more falls in the last 12 monthso 0 No Have you had any fall that resulted in injury in the last 12 monthso 0 Yes FALLS RISK SCREEN History of falling - immediate or within 3 months 0 No Secondary diagnosis (Do you have 2 or more medical diagnoseso) 0 No Ambulatory aid None/bed rest/wheelchair/nurse 0 No Crutches/cane/walker 15 Yes Furniture 0 No Intravenous therapy Access/Saline/Heparin Lock 0 No Gait/Transferring Normal/ bed rest/ wheelchair 0 No Weak (short steps with or without shuffle,  stooped but able to lift head while walking, may seek 10 Yes support from furniture) Impaired (short steps with shuffle, may have difficulty arising from chair, head down, impaired 0 No balance) Mental Status Oriented to own ability 0 Yes Electronic Signature(s) Signed: 07/10/2022 5:00:23 PM By: Blanche East RN Entered By: Blanche East on 07/10/2022 14:22:48 -------------------------------------------------------------------------------- Foot Assessment Details Patient Name: [name]: [name] Banker [name] 07/10/2022 1:30 PM Medical Record Number: [id number] Patient Account Number: [id number] Date of Birth/Sex: Treating RN: 05-19-1941 (81 y.o. Waldron Session Primary Care Vaughn Frieze: [name] Other Clinician: Referring Deundra Furber: Treating Amaiyah Nordhoff/Extender: Benedict Needy Weeks in Treatment: 0 Foot Assessment Items Site Locations + = Sensation present, - = Sensation absent, C = Callus, U = Ulcer R = Redness, W = Warmth, M = Maceration, PU = Pre-ulcerative lesion F = Fissure, S = Swelling, D = Dryness Assessment Right: Left: Other Deformity: No No Prior Foot Ulcer: No No Prior Amputation: No No Charcot Joint: No No Ambulatory Status: Ambulatory With Help Assistance Device: Cane Gait: Steady Electronic Signature(s) Signed: 07/10/2022 5:00:23 PM By:  Blanche East RN Entered By: Blanche East on 07/10/2022 14:25:40 -------------------------------------------------------------------------------- Nutrition Risk Screening Details [name], [name] 07/10/2022 1:30 PM Medical Record Number: [id number] Patient Account Number: [id number] Date of Birth/Sex: Treating RN: 22-Mar-1941 (81 y.o. Waldron Session Primary Care Prabhnoor Ellenberger: [name] Other Clinician: Referring Omelia Marquart: Treating Islam Eichinger/Extender: Benedict Needy Weeks in Treatment: 0 Height (in): 68 Weight (lbs): 245 Body Mass Index (BMI): 37.2 Nutrition Risk Screening Items Score Screening NUTRITION RISK SCREEN: I have an illness or condition that made me change the kind and/or amount of food I eat 0 No I eat fewer than two meals per day 0 No I eat few fruits and vegetables, or milk products 0 No I have three or more drinks of beer, liquor or wine almost every day 0 No I have tooth or mouth problems that make it hard for me to eat 0 No I don't always have enough money to buy the food I need 0 No I eat alone most of the time 1 Yes I take three or more different prescribed or over-the-counter drugs a day 1 Yes Without wanting to, I have lost or gained 10 pounds in the last six months 2 Yes I am not always physically able to shop, cook and/or feed myself 0 No Nutrition Protocols Good Risk Protocol Moderate Risk Protocol 0 Provide education on nutrition High Risk Proctocol Risk Level: Moderate Risk Score: 4 Electronic Signature(s) Signed: 07/10/2022 5:00:23 PM By: Blanche East RN Entered By: Blanche East on 07/10/2022 14:23:56

## 2022-07-10 NOTE — Progress Notes (Signed)
TEDDIE, MEHTA ([id number]) Visit Report for 07/10/2022 Allergy List Details Patient Name: [name]: MYSHAWN, CHIRIBOGA [name] 07/10/2022 1:30 PM Medical Record Number: [id number] Patient Account Number: [id number] Date of Birth/Sex: Treating RN: 07/24/1941 (81 y.o. Waldron Session Primary Care Tod Abrahamsen: [name] Other Clinician: Referring Areon Cocuzza: Treating Shea Kapur/Extender: Benedict Needy Weeks in Treatment: 0 Allergies Active Allergies No Known Allergies Allergy Notes Electronic Signature(s) Signed: 07/10/2022 5:00:23 PM By: Blanche East RN Entered By: Blanche East on 07/10/2022 14:06:35 -------------------------------------------------------------------------------- Arrival Information Details [name] 07/10/2022 1:30 PM Medical Record Number: [id number] Patient Account Number: [id number] Date of Birth/Sex: Treating RN: 05-17-1941 (81 y.o. Waldron Session Primary Care Pape Parson: [name] Other Clinician: Referring Natacha Jepsen: Treating Henson Fraticelli/Extender: Benedict Needy Weeks in Treatment: 0 Visit Information Patient Arrived: Lyndel Pleasure Time: 14:01 Accompanied By: self Transfer Assistance: None Patient Identification Verified: Yes Secondary Verification Process Completed: Yes Patient Requires Transmission-Based Precautions: No Patient Has Alerts: Yes Patient Alerts: ABI LLE 1.16, RLE 1.14 History Since Last Visit All ordered tests and consults were completed: Yes Added or deleted any medications: No Any new allergies or adverse reactions: No Had a fall or experienced change in activities of daily living that may affect risk of falls: No Signs or symptoms of abuse/neglect since last visito No Hospitalized since last visit: No Implantable device outside of the clinic excluding cellular tissue based products placed in the center since last visit: No Electronic  Signature(s) Signed: 07/10/2022 5:00:23 PM By: Blanche East RN Entered By: Blanche East on 07/10/2022 16:10:49 -------------------------------------------------------------------------------- Clinic Level of Care Assessment Details Patient Name: [name]: CRISTINA, MATTERN [name] 07/10/2022 1:30 PM Medical Record Number: [id number] Patient Account Number: [id number] Date of Birth/Sex: Treating RN: [birth year] (81 y.o. Waldron Session Primary Care Johathan Province: [name] Other Clinician: Referring December Hedtke: Treating Rihana Kiddy/Extender: Benedict Needy Weeks in Treatment: 0 Clinic Level of Care Assessment Items TOOL 1 Quantity Score X- 1 0 Use when EandM and Procedure is performed on INITIAL visit ASSESSMENTS - Nursing Assessment / Reassessment X- 1 20 General Physical Exam (combine w/ comprehensive assessment (listed just below) when performed on new pt. evals) X- 1 25 Comprehensive Assessment (HX, ROS, Risk Assessments, Wounds Hx, etc.) ASSESSMENTS - Wound and Skin Assessment / Reassessment X- 1 10 Dermatologic / Skin Assessment (not related to wound area) ASSESSMENTS - Ostomy and/or Continence Assessment and Care '[]'$  - 0 Incontinence Assessment and Management '[]'$  - 0 Ostomy Care Assessment and Management (repouching, etc.) PROCESS - Coordination of Care X - Simple Patient / Family Education for ongoing care 1 15 '[]'$  - 0 Complex (extensive) Patient / Family Education for ongoing care X- 1 10 Staff obtains Programmer, systems, Records, T Results / Process Orders est X- 1 10 Staff telephones HHA, Nursing Homes / Clarify orders / etc X- 1 10 Routine Transfer to another Facility (non-emergent condition) '[]'$  - 0 Routine Hospital Admission (non-emergent condition) '[]'$  - 0 New Admissions / Biomedical engineer / Ordering NPWT Apligraf, etc. , '[]'$  - 0 Emergency Hospital Admission (emergent condition) PROCESS - Special Needs '[]'$  - 0 Pediatric / Minor Patient  Management '[]'$  - 0 Isolation Patient Management '[]'$  - 0 Hearing / Language / Visual special needs '[]'$  - 0 Assessment of Community assistance (transportation, D/C planning, etc.) '[]'$  - 0 Additional assistance / Altered mentation '[]'$  - 0 Support Surface(s) Assessment (bed, cushion, seat, etc.) INTERVENTIONS - Miscellaneous '[]'$  - 0  External ear exam '[]'$  - 0 Patient Transfer (multiple staff / Civil Service fast streamer / Similar devices) X- 1 5 Simple Staple / Suture removal (25 or less) '[]'$  - 0 Complex Staple / Suture removal (26 or more) '[]'$  - 0 Hypo/Hyperglycemic Management (do not check if billed separately) '[]'$  - 0 Ankle / Brachial Index (ABI) - do not check if billed separately Has the patient been seen at the hospital within the last three years: Yes Total Score: 105 Level Of Care: New/Established - Level 3 Electronic Signature(s) Signed: 07/10/2022 5:00:23 PM By: Blanche East RN Signed: 07/10/2022 5:00:23 PM By: Blanche East RN Entered By: Blanche East on 07/10/2022 14:42:23 -------------------------------------------------------------------------------- Compression Therapy Details [name] 07/10/2022 1:30 PM Medical Record Number: [id number] Patient Account Number: [id number] Date of Birth/Sex: Treating RN: April 15, 1941 (81 y.o. Waldron Session Primary Care Aizik Reh: [name] Other Clinician: Referring Edd Reppert: Treating Alinna Siple/Extender: Benedict Needy Weeks in Treatment: 0 Compression Therapy Performed for Wound Assessment: Wound #7 Right,Anterior Ankle Performed By: Clinician Blanche East, RN Compression Type: Three Layer Post Procedure Diagnosis Same as Pre-procedure Electronic Signature(s) Signed: 07/10/2022 5:00:23 PM By: Blanche East RN Entered By: Blanche East on 07/10/2022 16:16:44 -------------------------------------------------------------------------------- Encounter Discharge Information  Details [name] 07/10/2022 1:30 PM Medical Record Number: [id number] Patient Account Number: [id number] Date of Birth/Sex: Treating RN: 1941-09-03 (81 y.o. Waldron Session Primary Care Georgette Helmer: [name] Other Clinician: Referring Joseangel Nettleton: Treating Bowen Goyal/Extender: Benedict Needy Weeks in Treatment: 0 Encounter Discharge Information Items Post Procedure Vitals Discharge Condition: Stable Temperature (F): 97.9 Ambulatory Status: Cane Pulse (bpm): 81 Discharge Destination: Home Respiratory Rate (breaths/min): 18 Transportation: Private Auto Blood Pressure (mmHg): 145/78 Accompanied By: self Schedule Follow-up Appointment: Yes Clinical Summary of Care: Electronic Signature(s) Signed: 07/10/2022 5:00:23 PM By: Blanche East RN Entered By: Blanche East on 07/10/2022 16:15:32 -------------------------------------------------------------------------------- Lower Extremity Assessment Details [name] 07/10/2022 1:30 PM Medical Record Number: [id number] Patient Account Number: [id number] Date of Birth/Sex: Treating RN: 21-Feb-1941 (81 y.o. Waldron Session Primary Care Aiya Keach: [name] Other Clinician: Referring Akesha Uresti: Treating Raden Byington/Extender: Benedict Needy Weeks in Treatment: 0 Edema Assessment Assessed: [Left: No] [Right: No] E[Left: dema] [Right: :] Calf Left: Right: Point of Measurement: From Medial Instep 36 cm 40.5 cm Ankle Left: Right: Point of Measurement: From Medial Instep 26 cm 25.5 cm Knee To Floor Left: Right: From Medial Instep 46 cm 49 cm Vascular Assessment Pulses: Dorsalis Pedis Palpable: [Left:Yes] [Right:Yes] Notes May 2023: ABI LLE 1.16 RLE 1.14 Electronic Signature(s) Signed: 07/10/2022 5:00:23 PM By: Blanche East RN Entered By: Blanche East on 07/10/2022  16:12:38 -------------------------------------------------------------------------------- Multi Wound Chart Details [name] 07/10/2022 1:30 PM Medical Record Number: [id number] Patient Account Number: [id number] Date of Birth/Sex: Treating RN: Jan 24, 1941 (81 y.o. 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 N/A Wound Location: Footwear Injury Skin Tear/Laceration N/A Wounding Event: Venous Leg Ulcer T be determined o N/A Primary Etiology: Cataracts, Chronic sinus Cataracts, Chronic sinus N/A Comorbid History: problems/congestion, Asthma, Sleep problems/congestion, Asthma, Sleep Apnea, Hypertension, Peripheral Apnea, Hypertension, Peripheral Venous  Disease, Osteoarthritis Venous Disease, Osteoarthritis 07/03/2022 07/03/2022 N/A Date Acquired: 0 0 N/A Weeks of Treatment: Open Open N/A Wound Status: No No N/A Wound Recurrence: 0.6x3x0.1 1x1x0.1 N/A Measurements L x W x D (cm) 1.414 0.785 N/A A (cm) : rea 0.141 0.079 N/A Volume (cm) : 0.00% -523.00% N/A % Reduction in A rea: 0.00% -507.70% N/A % Reduction in Volume: Full Thickness Without Exposed Full Thickness Without Exposed N/A Classification: Support Structures Support Structures Medium None Present N/A Exudate A mount: Serosanguineous N/A N/A Exudate Type: red, brown N/A N/A Exudate Color: Medium (34-66%) Small (1-33%) N/A Granulation A mount: Red, Pink Pink N/A Granulation Quality: Medium (34-66%) Large (67-100%) N/A Necrotic A mount: Eschar Eschar N/A Necrotic Tissue: Small (1-33%) Small  (1-33%) N/A Epithelialization: Debridement - Selective/Open Wound Debridement - Selective/Open Wound N/A Debridement: Pre-procedure Verification/Time Out 14:45 14:45 N/A Taken: Lidocaine 5% topical ointment Lidocaine 5% topical ointment N/A Pain Control: [employer], Slough N/A Tissue Debrided: Non-Viable Tissue Skin/Epidermis N/A Level: 1.8 1 N/A Debridement A (sq cm): rea Curette Curette N/A Instrument: Minimum Minimum N/A Bleeding: Pressure Pressure N/A Hemostasis A chieved: 0 0 N/A Procedural Pain: 0 0 N/A Post Procedural Pain: Procedure was tolerated well Procedure was tolerated well N/A Debridement Treatment Response: 0.6x3x0.1 1x1x0.1 N/A Post Debridement Measurements L x W x D (cm) 0.141 0.079 N/A Post Debridement Volume: (cm) Debridement Debridement N/A Procedures Performed: Treatment Notes Electronic Signature(s) Signed: 07/10/2022 3:13:36 PM By: Fredirick Maudlin MD FACS Entered By: Fredirick Maudlin on 07/10/2022 15:13:36 -------------------------------------------------------------------------------- Multi-Disciplinary Care Plan Details [name] 07/10/2022 1:30 PM Medical Record Number: [id number] Patient Account Number: [id number] Date of Birth/Sex: Treating RN: 1941/02/27 (81 y.o. Waldron Session Primary Care Pratt Bress: [name] Other Clinician: Referring Grissel Tyrell: Treating Henrick Mcgue/Extender: Benedict Needy Weeks in Treatment: 0 Active Inactive Venous Leg Ulcer Nursing Diagnoses: Potential for venous Insuffiency (use before diagnosis confirmed) Goals: Patient will maintain optimal edema control Date Initiated: 07/10/2022 Target Resolution Date: 08/07/2022 Goal Status: Active Interventions: Assess peripheral edema status every visit. Treatment Activities: Therapeutic compression applied : 07/10/2022 Notes: Wound/Skin Impairment Nursing Diagnoses: Impaired tissue  integrity Goals: Ulcer/skin breakdown will have a volume reduction of 30% by week 4 Date Initiated: 07/10/2022 Target Resolution Date: 08/07/2022 Goal Status: Active Interventions: Provide education on ulcer and skin care Treatment Activities: Skin care regimen initiated : 07/10/2022 Topical wound management initiated : 07/10/2022 Notes: Electronic Signature(s) Signed: 07/10/2022 5:00:23 PM By: Blanche East RN Entered By: Blanche East on 07/10/2022 14:32:46 -------------------------------------------------------------------------------- Pain Assessment Details [name] 07/10/2022 1:30 PM Medical Record Number: [id number] Patient Account Number: [id number] Date of Birth/Sex: Treating RN: 1940/12/09 (81 y.o. Waldron Session Primary Care Tricia Pledger: [name] Other Clinician: Referring Ibrahima Holberg: Treating Cinnamon Morency/Extender: Benedict Needy Weeks in Treatment: 0 Active Problems Location of Pain Severity and Description of Pain Patient Has Paino No Site Locations Pain Management and Medication Current Pain Management: Electronic Signature(s) Signed: 07/10/2022 5:00:23 PM By: Blanche East RN Entered By: Blanche East on 07/10/2022 14:31:27 -------------------------------------------------------------------------------- Patient/Caregiver Education Details Patient Name: [name]: [name] RD Viona Gilmore 8/31/2023andnbsp1:30 PM Medical Record Number: [id number] Patient Account Number: [id number] Date of Birth/Gender: Treating RN: [birth year] (81 y.o. Waldron Session Primary Care Physician: [name] Other Clinician: Referring Physician: Treating Physician/Extender: Benedict Needy Weeks in Treatment: 0 Education Assessment Education Provided To: Patient Education Topics Provided Wound/Skin Impairment: Methods: Explain/Verbal Responses: Reinforcements needed,  State content  correctly Electronic Signature(s) Signed: 07/10/2022 5:00:23 PM By: Blanche East RN Entered By: Blanche East on 07/10/2022 14:33:03 -------------------------------------------------------------------------------- Wound Assessment Details [name] 07/10/2022 1:30 PM Medical Record Number: [id number] Patient Account Number: [id number] Date of Birth/Sex: Treating RN: 11-Mar-1941 (81 y.o. Waldron Session Primary Care Jameson Tormey: [name] Other Clinician: Referring Price Lachapelle: Treating Keylani Perlstein/Extender: Benedict Needy Weeks in Treatment: 0 Wound Status Wound Number: 7 Primary Venous Leg Ulcer Etiology: Wound Location: Right, Anterior Ankle Wound Open Wounding Event: Footwear Injury Status: Date Acquired: 07/03/2022 Comorbid Cataracts, Chronic sinus problems/congestion, Asthma, Sleep Weeks Of Treatment: 0 History: Apnea, Hypertension, Peripheral Venous Disease, Osteoarthritis Clustered Wound: No Photos Wound Measurements Length: (cm) 0.6 Width: (cm) 3 Depth: (cm) 0.1 Area: (cm) 1.414 Volume: (cm) 0.141 % Reduction in Area: 0% % Reduction in Volume: 0% Epithelialization: Small (1-33%) Tunneling: No Undermining: No Wound Description Classification: Full Thickness Without Exposed Support Structures Exudate Amount: Medium Exudate Type: Serosanguineous Exudate Color: red, brown Foul Odor After Cleansing: No Slough/Fibrino Yes Wound Bed Granulation Amount: Medium (34-66%) Granulation Quality: Red, Pink Necrotic Amount: Medium (34-66%) Necrotic Quality: Eschar Treatment Notes Wound #7 (Ankle) Wound Laterality: Right, Anterior Cleanser Soap and Water Discharge Instruction: May shower and wash wound with dial antibacterial soap and water prior to dressing change. Peri-Wound Care Sween Lotion (Moisturizing lotion) Discharge Instruction: Apply moisturizing lotion as directed Topical Primary  Dressing KerraCel Ag Gelling Fiber Dressing, 2x2 in (silver alginate) Discharge Instruction: Apply silver alginate to wound bed as instructed Secondary Dressing ABD Pad, 5x9 Discharge Instruction: Apply over primary dressing as directed. Secured With Compression Wrap ThreePress (3 layer compression wrap) Discharge Instruction: Apply three layer compression as directed. Compression Stockings Add-Ons Electronic Signature(s) Signed: 07/10/2022 5:00:23 PM By: Blanche East RN Entered By: Blanche East on 07/10/2022 15:05:15 -------------------------------------------------------------------------------- Wound Assessment Details [name] 07/10/2022 1:30 PM Medical Record Number: [id number] Patient Account Number: [id number] Date of Birth/Sex: Treating RN: 31-Oct-1941 (81 y.o. 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 Wound Open Wounding Event: Skin Tear/Laceration Status: Date Acquired: 07/03/2022 Comorbid Cataracts, Chronic sinus problems/congestion, Asthma, Sleep Weeks Of Treatment: 0 History: Apnea, Hypertension, Peripheral Venous Disease, Osteoarthritis Clustered Wound: No Wound Measurements Length: (cm) 1 Width: (cm) 1 Depth: (cm) 0.1 Area: (cm) 0.785 Volume: (cm) 0.079 % Reduction in Area: -523% % Reduction in Volume: -507.7% Epithelialization: Small (1-33%) Tunneling: No Undermining: No Wound Description Classification: Full Thickness Without Exposed Support Structures Exudate Amount: None Present Foul Odor After Cleansing: No Slough/Fibrino Yes Wound Bed Granulation Amount: Small (1-33%) Exposed Structure Granulation Quality: Pink Fascia Exposed: No Necrotic Amount: Large  (67-100%) Fat Layer (Subcutaneous Tissue) Exposed: Yes Necrotic Quality: Eschar Tendon Exposed: No Muscle Exposed: No Joint Exposed: No Bone Exposed: No Treatment Notes Wound #8 (Amputation Site - Toe) Wound Laterality: Right Cleanser Soap and Water Discharge Instruction: May shower and wash wound with dial antibacterial soap and water prior to dressing change. Peri-Wound Care Sween Lotion (Moisturizing lotion) Discharge Instruction: Apply moisturizing lotion as directed Topical Primary Dressing KerraCel Ag Gelling Fiber Dressing, 2x2 in (silver alginate) Discharge Instruction: Apply silver alginate to wound bed as instructed Secondary Dressing Bordered Gauze, 2x2 in Discharge Instruction: Apply over primary dressing as directed. Secured With [employer]  Medipore Soft Cloth Surgical T 2x10 (in/yd) ape Discharge Instruction: Secure with tape as directed. Compression Wrap Compression Stockings Add-Ons Electronic Signature(s) Signed: 07/10/2022 5:00:23 PM By: Blanche East RN Entered By: Blanche East on 07/10/2022 15:05:15 -------------------------------------------------------------------------------- Vitals Details [name] 07/10/2022 1:30 PM Medical Record Number: [id number] Patient Account Number: [id number] Date of Birth/Sex: Treating RN: 1941/01/19 (81 y.o. Waldron Session Primary Care Clara Smolen: [name] Other Clinician: Referring Atzin Buchta: Treating Timarie Labell/Extender: Benedict Needy Weeks in Treatment: 0 Vital Signs Time Taken: 14:03 Temperature (F): 97.9 Height (in): 68 Pulse (bpm): 81 Source: Stated Respiratory Rate (breaths/min): 20 Weight (lbs): 245 Blood Pressure (mmHg): 145/78 Source: Stated Reference Range: 80 - 120 mg / dl Body Mass Index (BMI): 37.2 Electronic Signature(s) Signed: 07/10/2022 5:00:23 PM By: Blanche East RN Entered By: Blanche East on 07/10/2022 14:06:31

## 2022-07-11 NOTE — Progress Notes (Signed)
DENSON, NICCOLI ([id number]) Visit Report for 07/10/2022 Chief Complaint Document Details Patient Name: [name]: TRAJAN, GROVE [name] 07/10/2022 1:30 PM Medical Record Number: [id number] Patient Account Number: [id number] Date of Birth/Sex: Treating RN: 10-22-1941 (81 y.o. 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 07/10/2022 1:30 PM Medical Record Number: [id number] Patient Account Number: [id number] Date of Birth/Sex: Treating RN: Apr 29, 1941 (81 y.o. Waldron Session Primary Care Provider: [name] Other Clinician: Referring Provider: Treating Provider/Extender: Benedict Needy Weeks in Treatment: 0 Debridement Performed for Assessment: Wound #7 Left,Anterior Ankle Performed By: Physician Fredirick Maudlin, MD Debridement Type: Debridement Severity of Tissue Pre Debridement: Fat layer exposed Level of Consciousness (Pre-procedure): Awake and Alert Pre-procedure Verification/Time Out Yes - 14:45 Taken: Start Time: 14:46 Pain Control: Lidocaine 5% topical ointment T Area Debrided (L x W): otal 0.6 (cm) x 3 (cm) = 1.8 (cm) Tissue and other material debrided: Non-Viable,  Slough, Slough Level: Non-Viable Tissue Debridement Description: Selective/Open Wound Instrument: Curette Bleeding: Minimum Hemostasis Achieved: Pressure Procedural Pain: 0 Post Procedural Pain: 0 Response to Treatment: Procedure was tolerated well Level of Consciousness (Post- Awake and Alert procedure): Post Debridement Measurements of Total Wound Length: (cm) 0.6 Width: (cm) 3 Depth: (cm) 0.1 Volume: (cm) 0.141 Character of Wound/Ulcer Post Debridement: Requires Further Debridement Severity of Tissue Post Debridement: Fat layer exposed Post Procedure Diagnosis Same as Pre-procedure Electronic Signature(s) Signed: 07/10/2022 3:26:23 PM By: Fredirick Maudlin MD FACS Signed: 07/10/2022 5:00:23 PM By: Blanche East RN Entered By: Blanche East on 07/10/2022 14:46:48 -------------------------------------------------------------------------------- Debridement Details [name] 07/10/2022 1:30 PM Medical Record Number: [id number] Patient Account Number: [id number] Date of Birth/Sex: Treating RN: 1941/08/13 (81 y.o. Waldron Session Primary Care Provider: [name] Other Clinician: Referring Provider: Treating Provider/Extender: Benedict Needy Weeks in Treatment: 0 Debridement Performed for Assessment: Wound #8 Right Amputation Site - Toe Performed By: Physician Fredirick Maudlin, MD Debridement Type: Debridement Level of Consciousness (Pre-procedure): Awake and Alert Pre-procedure Verification/Time Out Yes - 14:45 Taken: Start Time: 14:46 Pain Control: Lidocaine 5% topical ointment T Area Debrided (L x W): otal 1 (cm) x 1 (cm) = 1 (cm) Tissue and other material debrided: Non-Viable, Callus, Slough, Skin: Epidermis, Slough Level: Skin/Epidermis Debridement Description: Selective/Open Wound Instrument: Curette Bleeding: Minimum Hemostasis Achieved: Pressure Procedural Pain: 0 Post Procedural Pain:  0 Response to Treatment: Procedure was tolerated well Level of Consciousness (Post- Awake and Alert procedure): Post Debridement Measurements of Total Wound Length: (cm) 1 Width: (cm) 1 Depth: (cm) 0.1 Volume: (cm) 0.079 Character of Wound/Ulcer Post Debridement: Requires Further Debridement Post Procedure Diagnosis Same as Pre-procedure Electronic Signature(s) Signed: 07/10/2022 3:26:23 PM By: Fredirick Maudlin MD FACS Signed: 07/10/2022 5:00:23 PM By: Blanche East RN Entered By: Blanche East on 07/10/2022 15:05:32 -------------------------------------------------------------------------------- HPI Details [name]  07/10/2022 1:30 PM Medical Record Number: [id number] Patient Account Number: [id number] Date of Birth/Sex: Treating RN: 1941/05/22 (81 y.o. M) Primary Care Provider: [name] Other Clinician: Referring Provider: Treating Provider/Extender: Benedict Needy Weeks in Treatment: 0 History of Present Illness HPI Description: ADMISSION 02/07/2021 This is a 81 year old man who is not a diabetic "prediabetic" who comes in today letter asking [place] to look at an area on his right first toe tip that has been there about a year. He has been getting this dressed that [employer] and he had Xeroform on this with gauze. The patient is not exactly sure how this came about but he is not able to dress the wound himself because of limitations due to what sounds like spinal stenosis and pain. He had a wound on the ankle as well but that healed over. He was seen by vascular in May 2021. This was related to a right great toe wound. He had an angiogram that was completely normal on both sides good perfusion right into the feet. He also looks like he has lymphedema and he has compression stockings but he cannot get them on and off so he essentially leaves he is on even when he showering Past medical history includes prediabetes, low  back pain secondary I think the lumbar spinal stenosis has been offered a decompressive laminectomy but he has not gone forth with this. 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 ABI was noncompressible in our clinic on the right right first toe. 4/7; patient I admitted the clinic last week. He has an area on the tip of his right first toe. We cleaned this off last week and have been using silver collagen. He is in the same crocs today that he wore last week and I told him not to use. For 1 reason or another he could not handle a surgical sandal. 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 He is not a diabetic 4/12; right first toe wound chronic. We have been using silver collagen. Once again he comes in then the same pair of old crocs that he comes in every week. 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 His x-ray suggested suspicious for osteomyelitis with bone irregularity at the tuft of the right great toe 4/21; patient has been using silver collagen every other day to the wound on his right great toe. He continues to wear crocs. He has no complaints or issues today. 5/5; 2-week follow-up. 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 Mild soft tissue edema with enhancement at the distal aspect of the great toe suggestive of cellulitis. Fortunately the wound actually looks better. Has been using Hydrofera Blue 5/12; started him on Augmentin last week 875 twice daily he is tolerating this well. Last creatinine I see is 1.77 in early April. We are using Hydrofera Blue to the wound which actually looks better today. 5/17; tolerating the Augmentin well. He says the wound is actually been hurting him episodically. Actually the surface of this looks quite good. He did not get his lab work done. We are using  [facility] he is changing this himself 6/2; still taking Augmentin he should be rounding into his last week. This was empirically for underlying osteomyelitis. The area on the tip of his  toe is still open still with some depth but no palpable bone. We have been using Hydrofera Blue. Surface area of the wound has not been making much improvement Is work was essentially unremarkable except for a creatinine of 1.79. Reason for his renal insufficiency is not clear he is not a diabetic does not have hypertension however he does take NSAIDs and Lasix I wonder if this is the issue here. Note that his creatinine on 11/12/2018 was 1.45. Platelet count slightly low at 143 however his inflammatory markers were really in the normal range at 10 and 2 sedimentation rate and C-reactive protein respectively. 6/9; he apparently had another 2 weeks of Augmentin after we look that this last time. Therefore he should be coming into his last week next week. We are using polymen on the tip of the toe. He cannot get down to do the dressing so he comes in for a nurse visit. He comes in in crocs I have warned him against this although he says he is using a different shoe for most of the day he works in [employer]. 6/16; he comes back in in crocs. I have spoken him about this before. His areas on the tip of his toe this actually looks quite good we have been using polymen. He has completed his antibiotics which we empirically gave him for underlying osteomyelitis. Everything looks better to me in spite of his noncompliance with footwear recommendation 7/11; patient has been followed through nurse visits. He was last seen by provider on 6/16. He reports improvement to the wound with the use of PolyMem. He denies signs of infection. 7/21; the patient arrives with the area on the tip of his right great toe completely healed. He has the same proximal on that I see every time. I treated him empirically with Augmentin for  osteomyelitis. He needs to be vigilant about this. READMISSION 03/19/2022 The patient returns with a reopening of the right great toe wound. Apparently this occurred in around October of last year. He was followed by podiatry for some time. They have performed x-rays but unfortunately, we do not have access to them or the results. No report as to whether or not he might have osteomyelitis. In addition, he has a new wound on the medial aspect of the right great toe as well as the anterior tibial surface of his right lower leg. He does state that he was wearing compression stockings when this tibial wound occurred. He continues to wear crocs. 04/02/2022: For some reason, the patient has not been seen in 2 weeks. He says that he had to reschedule his visit. Therefore his Coflex compression wrap was left in situ for 14 days. It appears that his skin became macerated and he has opened up several new superficial wounds on his right anterior tibial surface. We did get an x-ray to evaluate for osteomyelitis and none was seen. All of the pre-existing wounds are smaller today and the new wounds look like they are limited to just the breakdown of skin. 04/10/2022: The wounds on his right anterior tibial surface are actually a little bit larger today. He seems to have some skin irritation and maceration in the area again. The tip of his toe is nearly closed as is the wound on the dorsal surface of his right great toe. READMISSION 07/10/2022 Since our last visit, the patient went to the operating room to have a left hip replacement, but in the preop holding area, his surgeon noted a foul  odor coming from his right foot. He contacted me and described the findings of his right great toe and we mutually agreed, along with the patient, that amputation would be most appropriate. 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 Since his surgery, he has been  wearing compression stockings bilaterally, but as he is unable to apply or remove them himself, he wears them for a week at a time. This appears to have resulted in a pressure injury on his right anterior ankle at the point of flexion; he has a similar looking abrasion that has not broken the skin in the same site on the left. 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 Electronic Signature(s) Signed: 07/10/2022 3:19:14 PM By: Fredirick Maudlin MD FACS Entered By: Fredirick Maudlin on 07/10/2022 15:19:14 -------------------------------------------------------------------------------- Physical Exam Details [name] 07/10/2022 1:30 PM Medical Record Number: [id number] Patient Account Number: [id number] Date of Birth/Sex: Treating RN: [birth year] (81 y.o. M) Primary Care Provider: [name] Other Clinician: Referring Provider: Treating Provider/Extender: Benedict Needy Weeks in Treatment: 0 Constitutional Slightly hypertensive. . . . No acute distress. Respiratory Normal work of breathing on room air.. Cardiovascular Leg is swollen compared to the left, but does not have pitting edema. The patient states this is baseline for him.. Notes 07/10/2022: There a pressure injury on his right anterior ankle at the point of flexion; he has a similar looking abrasion that has not broken the skin in the same site on the left. The right sided wound appears limited to breakdown of skin. 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 Under the crusting, there are a few small open areas along what appears to be the old suture line. The fat layer is exposed. Electronic Signature(s) Signed: 07/10/2022 3:20:40 PM By: Fredirick Maudlin MD FACS Entered By: Fredirick Maudlin on 07/10/2022 15:20:40 -------------------------------------------------------------------------------- Physician  Orders Details [name] 07/10/2022 1:30 PM Medical Record Number: [id number] Patient Account Number: [id number] Date of Birth/Sex: Treating RN: 04/06/1941 (81 y.o. Waldron Session Primary Care Provider: [name] Other Clinician: Referring Provider: Treating Provider/Extender: Benedict Needy Weeks in Treatment: 0 Verbal / Phone Orders: No Diagnosis Coding ICD-10 Coding Code Description [phone] Pressure ulcer of right ankle, stage 2 L97.512 Non-pressure chronic ulcer of other part of right foot with fat layer exposed T81.31XS Disruption of external operation (surgical) wound, not elsewhere classified, sequela N18.30 Chronic kidney disease, stage 3 unspecified I87.2 Venous insufficiency (chronic) (peripheral) Follow-up Appointments ppointment in 1 week. - Dr. Celine Ahr- Rm 4 Return A Friday 07/18/22 at 3:15PM Anesthetic Wound #7 Right,Anterior Ankle (In clinic) Topical Lidocaine 5% applied to wound bed - prior to debridement Wound #8 Right Amputation Site - Toe (In clinic) Topical Lidocaine 5% applied to wound bed Bathing/ Shower/ Hygiene May shower with protection but do not get wound dressing(s) wet. - Right leg- wear cast protector when bathing/ showering Edema Control - Lymphedema / SCD / Other Left Lower Extremity Patient to wear own compression stockings every day. Moisturize legs daily. Wound Treatment Wound #7 - Ankle Wound Laterality: Right, Anterior Cleanser: Soap and Water Discharge Instructions: May shower and wash wound with dial antibacterial soap and water prior to dressing change. Peri-Wound Care: Sween Lotion (Moisturizing lotion) Discharge Instructions: Apply moisturizing lotion as directed Prim Dressing: Paulina Fusi  Ag Gelling Fiber Dressing, 2x2 in (silver alginate) ary Discharge Instructions: Apply silver alginate to wound bed as instructed Secondary Dressing: ABD Pad, 5x9 Discharge Instructions:  Apply over primary dressing as directed. Compression Wrap: ThreePress (3 layer compression wrap) Discharge Instructions: Apply three layer compression as directed. Wound #8 - Amputation Site - Toe Wound Laterality: Right Cleanser: Soap and Water Discharge Instructions: May shower and wash wound with dial antibacterial soap and water prior to dressing change. Peri-Wound Care: Sween Lotion (Moisturizing lotion) Discharge Instructions: Apply moisturizing lotion as directed Prim Dressing: KerraCel Ag Gelling Fiber Dressing, 2x2 in (silver alginate) ary Discharge Instructions: Apply silver alginate to wound bed as instructed Secondary Dressing: Bordered Gauze, 2x2 in Discharge Instructions: Apply over primary dressing as directed. Secured With: 1M Medipore Public affairs consultant Surgical T 2x10 (in/yd) ape Discharge Instructions: Secure with tape as directed. Consults Podiatry - Left foot Electronic Signature(s) Signed: 07/10/2022 4:15:15 PM By: Fredirick Maudlin MD FACS Signed: 07/10/2022 5:00:23 PM By: Blanche East RN Previous Signature: 07/10/2022 3:26:23 PM Version By: Fredirick Maudlin MD FACS Entered By: Blanche East on 07/10/2022 16:14:34 -------------------------------------------------------------------------------- Problem List Details [name] 07/10/2022 1:30 PM Medical Record Number: [id number] Patient Account Number: [id number] Date of Birth/Sex: Treating RN: 1941-04-08 (81 y.o. M) Primary Care Provider: [name] Other Clinician: Referring Provider: Treating Provider/Extender: Benedict Needy Weeks in Treatment: 0 Active Problems ICD-10 Encounter Code Description Active Date MDM Diagnosis L89.512 Pressure ulcer of right ankle, stage 2 07/10/2022 No Yes L97.512 Non-pressure chronic ulcer of other part of right foot with fat layer exposed 07/10/2022 No Yes T81.31XS Disruption of external operation (surgical) wound, not  elsewhere classified, 07/10/2022 No Yes sequela N18.30 Chronic kidney disease, stage 3 unspecified 07/10/2022 No Yes I87.2 Venous insufficiency (chronic) (peripheral) 07/10/2022 No Yes Inactive Problems Resolved Problems Electronic Signature(s) Signed: 07/10/2022 3:13:29 PM By: Fredirick Maudlin MD FACS Entered By: Fredirick Maudlin on 07/10/2022 15:13:29 -------------------------------------------------------------------------------- Progress Note Details [name] 07/10/2022 1:30 PM Medical Record Number: [id number] Patient Account Number: [id number] Date of Birth/Sex: Treating RN: 03/10/1941 (81 y.o. M) Primary Care Provider: [name] Other Clinician: Referring Provider: Treating Provider/Extender: Benedict Needy Weeks in Treatment: 0 Subjective Chief Complaint Information obtained from Patient 02/07/2021; patient is here for review of a wound on the tip of his right great toe 03/19/2022: re-opening of the right great toe wound, with additional wounds on medial aspect of right great toe and right anterior tibial surface 07/10/2022: Partial dehiscence of right great toe amputation site, pressure ulcer of anterior right ankle History of Present Illness (HPI) ADMISSION 02/07/2021 This is a 81 year old man who is not a diabetic "prediabetic" who comes in today letter asking [place] to look at an area on his right first toe tip that has been there about a year. He has been getting this dressed that [employer] and he had Xeroform on this with gauze. The patient is not exactly sure how this came about but he is not able to dress the wound himself because of limitations due to what sounds like spinal stenosis and pain. He had a wound on the ankle as well but that healed over. He was seen by vascular in May 2021. This was related to a right great toe wound. He had an angiogram that was completely normal on both sides good perfusion right into  the feet. He also looks like he has lymphedema and he has  compression stockings but he cannot get them on and off so he essentially leaves he is on even when he showering Past medical history includes prediabetes, low back pain secondary I think the lumbar spinal stenosis has been offered a decompressive laminectomy but he has not gone forth with this. 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 ABI was noncompressible in our clinic on the right right first toe. 4/7; patient I admitted the clinic last week. He has an area on the tip of his right first toe. We cleaned this off last week and have been using silver collagen. He is in the same crocs today that he wore last week and I told him not to use. For 1 reason or another he could not handle a surgical sandal. 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 He is not a diabetic 4/12; right first toe wound chronic. We have been using silver collagen. Once again he comes in then the same pair of old crocs that he comes in every week. 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 His x-ray suggested suspicious for osteomyelitis with bone irregularity at the tuft of the right great toe 4/21; patient has been using silver collagen every other day to the wound on his right great toe. He continues to wear crocs. He has no complaints or issues today. 5/5; 2-week follow-up. 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 Mild soft tissue edema with enhancement at the distal aspect of the great toe suggestive of cellulitis. Fortunately the wound actually looks better. Has been using Hydrofera Blue 5/12; started him on Augmentin last week 875 twice daily he is tolerating this well. Last creatinine I see is 1.77 in early April. We are using Hydrofera Blue to the wound which actually  looks better today. 5/17; tolerating the Augmentin well. He says the wound is actually been hurting him episodically. Actually the surface of this looks quite good. He did not get his lab work done. We are using [facility] he is changing this himself 6/2; still taking Augmentin he should be rounding into his last week. This was empirically for underlying osteomyelitis. The area on the tip of his toe is still open still with some depth but no palpable bone. We have been using Hydrofera Blue. Surface area of the wound has not been making much improvement Is work was essentially unremarkable except for a creatinine of 1.79. Reason for his renal insufficiency is not clear he is not a diabetic does not have hypertension however he does take NSAIDs and Lasix I wonder if this is the issue here. Note that his creatinine on 11/12/2018 was 1.45. Platelet count slightly low at 143 however his inflammatory markers were really in the normal range at 10 and 2 sedimentation rate and C-reactive protein respectively. 6/9; he apparently had another 2 weeks of Augmentin after we look that this last time. Therefore he should be coming into his last week next week. We are using polymen on the tip of the toe. He cannot get down to do the dressing so he comes in for a nurse visit. He comes in in crocs I have warned him against this although he says he is using a different shoe for most of the day he works in [employer]. 6/16; he  comes back in in crocs. I have spoken him about this before. His areas on the tip of his toe this actually looks quite good we have been using polymen. He has completed his antibiotics which we empirically gave him for underlying osteomyelitis. Everything looks better to me in spite of his noncompliance with footwear recommendation 7/11; patient has been followed through nurse visits. He was last seen by provider on 6/16. He reports improvement to the wound with the use of PolyMem. He denies  signs of infection. 7/21; the patient arrives with the area on the tip of his right great toe completely healed. He has the same proximal on that I see every time. I treated him empirically with Augmentin for osteomyelitis. He needs to be vigilant about this. READMISSION 03/19/2022 The patient returns with a reopening of the right great toe wound. Apparently this occurred in around October of last year. He was followed by podiatry for some time. They have performed x-rays but unfortunately, we do not have access to them or the results. No report as to whether or not he might have osteomyelitis. In addition, he has a new wound on the medial aspect of the right great toe as well as the anterior tibial surface of his right lower leg. He does state that he was wearing compression stockings when this tibial wound occurred. He continues to wear crocs. 04/02/2022: For some reason, the patient has not been seen in 2 weeks. He says that he had to reschedule his visit. Therefore his Coflex compression wrap was left in situ for 14 days. It appears that his skin became macerated and he has opened up several new superficial wounds on his right anterior tibial surface. We did get an x-ray to evaluate for osteomyelitis and none was seen. All of the pre-existing wounds are smaller today and the new wounds look like they are limited to just the breakdown of skin. 04/10/2022: The wounds on his right anterior tibial surface are actually a little bit larger today. He seems to have some skin irritation and maceration in the area again. The tip of his toe is nearly closed as is the wound on the dorsal surface of his right great toe. READMISSION 07/10/2022 Since our last visit, the patient went to the operating room to have a left hip replacement, but in the preop holding area, his surgeon noted a foul odor coming from his right foot. He contacted me and described the findings of his right great toe and we mutually agreed,  along with the patient, that amputation would be most appropriate. 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 Since his surgery, he has been wearing compression stockings bilaterally, but as he is unable to apply or remove them himself, he wears them for a week at a time. This appears to have resulted in a pressure injury on his right anterior ankle at the point of flexion; he has a similar looking abrasion that has not broken the skin in the same site on the left. 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 Patient History Information obtained from Patient. Allergies No Known Allergies Family History Diabetes - Maternal Grandparents, Heart Disease - Father, Hypertension - Father, Stroke - Father, Thyroid Problems - Mother,Child, No family history of Cancer, Hereditary Spherocytosis, Kidney Disease, Lung Disease, Seizures, Tuberculosis. Social History Never smoker, Marital Status - Single, Alcohol Use - Rarely, Drug Use - No  History, Caffeine Use - Daily - coffee. Medical History Eyes Patient has history of Cataracts - 2019 Ear/Nose/Mouth/Throat Patient has history of Chronic sinus problems/congestion Respiratory Patient has history of Asthma, Sleep Apnea Cardiovascular Patient has history of Hypertension, Peripheral Venous Disease Endocrine Denies history of Type II Diabetes Musculoskeletal Patient has history of Osteoarthritis Medical A Surgical History Notes nd Gastrointestinal GERD Genitourinary BPH Musculoskeletal Degenerative disc disease Neurologic Neuromuscular disorder Objective Constitutional Slightly hypertensive. No acute distress. Vitals Time Taken: 2:03 PM, Height: 68 in, Source: Stated, Weight: 245 lbs, Source: Stated, BMI: 37.2, Temperature: 97.9 F, Pulse: 81 bpm, Respiratory Rate: 20 breaths/min, Blood Pressure: 145/78 mmHg. Respiratory Normal work of breathing on  room air.. Cardiovascular Leg is swollen compared to the left, but does not have pitting edema. The patient states this is baseline for him.. General Notes: 07/10/2022: There a pressure injury on his right anterior ankle at the point of flexion; he has a similar looking abrasion that has not broken the skin in the same site on the left. The right sided wound appears limited to breakdown of skin. 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 Under the crusting, there are a few small open areas along what appears to be the old suture line. The fat layer is exposed. Integumentary (Hair, Skin) Wound #7 status is Open. Original cause of wound was Footwear Injury. The date acquired was: 07/03/2022. The wound is located on the Right,Anterior Ankle. The wound measures 0.6cm length x 3cm width x 0.1cm depth; 1.414cm^2 area and 0.141cm^3 volume. There is no tunneling or undermining noted. There is a medium amount of serosanguineous drainage noted. There is medium (34-66%) red, pink granulation within the wound bed. There is a medium (34-66%) amount of necrotic tissue within the wound bed including Eschar. Wound #8 status is Open. Original cause of wound was Skin T ear/Laceration. The date acquired was: 07/03/2022. The wound is located on the Right Amputation Site - T The wound measures 1cm length x 1cm width x 0.1cm depth; 0.785cm^2 area and 0.079cm^3 volume. There is Fat Layer (Subcutaneous Tissue) oe. exposed. There is no tunneling or undermining noted. There is a none present amount of drainage noted. There is small (1-33%) pink granulation within the wound bed. There is a large (67-100%) amount of necrotic tissue within the wound bed including Eschar. Assessment Active Problems ICD-10 Pressure ulcer of right ankle, stage 2 Non-pressure chronic ulcer of other part of right foot with fat layer exposed Disruption of external operation (surgical) wound, not elsewhere  classified, sequela Chronic kidney disease, stage 3 unspecified Venous insufficiency (chronic) (peripheral) Procedures Wound #7 Pre-procedure diagnosis of Wound #7 is a Venous Leg Ulcer located on the Left,Anterior Ankle .Severity of Tissue Pre Debridement is: Fat layer exposed. There was a Selective/Open Wound Non-Viable Tissue Debridement with a total area of 1.8 sq cm performed by Fredirick Maudlin, MD. With the following instrument(s): Curette to remove Non-Viable tissue/material. Material removed includes [facility] after achieving pain control using Lidocaine 5% topical ointment. No specimens were taken. A time out was conducted at 14:45, prior to the start of the procedure. A Minimum amount of bleeding was controlled with Pressure. The procedure was tolerated well with a pain level of 0 throughout and a pain level of 0 following the procedure. Post Debridement Measurements: 0.6cm length x 3cm width x 0.1cm depth; 0.141cm^3 volume. Character of Wound/Ulcer Post Debridement requires further debridement. Severity of Tissue Post Debridement is: Fat layer exposed. Post procedure  Diagnosis Wound #7: Same as Pre-Procedure Wound #8 Pre-procedure diagnosis of Wound #8 is a T be determined located on the Right Amputation Site - T . There was a Selective/Open Wound Skin/Epidermis o oe Debridement with a total area of 1 sq cm performed by Fredirick Maudlin, MD. With the following instrument(s): Curette to remove Non-Viable tissue/material. Material removed includes Callus, Slough, and Skin: Epidermis after achieving pain control using Lidocaine 5% topical ointment. No specimens were taken. A time out was conducted at 14:45, prior to the start of the procedure. A Minimum amount of bleeding was controlled with Pressure. The procedure was tolerated well with a pain level of 0 throughout and a pain level of 0 following the procedure. Post Debridement Measurements: 1cm length x 1cm width x 0.1cm depth; 0.079cm^3  volume. Character of Wound/Ulcer Post Debridement requires further debridement. Post procedure Diagnosis Wound #8: Same as Pre-Procedure Plan Follow-up Appointments: Return Appointment in 1 week. - Dr. Celine Ahr- Rm 4 Friday 07/18/22 at 3:15PM Anesthetic: Wound #7 Right,Anterior Ankle: (In clinic) Topical Lidocaine 5% applied to wound bed Wound #8 Right Amputation Site - T oe: (In clinic) Topical Lidocaine 5% applied to wound bed Bathing/ Shower/ Hygiene: May shower with protection but do not get wound dressing(s) wet. - Right leg- wear cast protector when bathing/ showering Edema Control - Lymphedema / SCD / Other: Patient to wear own compression stockings every day. Moisturize legs daily. Consults ordered were: Podiatry - Left foot WOUND #7: - Ankle Wound Laterality: Right, Anterior Cleanser: Soap and Water Discharge Instructions: May shower and wash wound with dial antibacterial soap and water prior to dressing change. Peri-Wound Care: Sween Lotion (Moisturizing lotion) Discharge Instructions: Apply moisturizing lotion as directed Prim Dressing: KerraCel Ag Gelling Fiber Dressing, 2x2 in (silver alginate) ary Discharge Instructions: Apply silver alginate to wound bed as instructed Secondary Dressing: ABD Pad, 5x9 Discharge Instructions: Apply over primary dressing as directed. Com pression Wrap: ThreePress (3 layer compression wrap) Discharge Instructions: Apply three layer compression as directed. WOUND #8: - Amputation Site - T oe Wound Laterality: Right Cleanser: Soap and Water Discharge Instructions: May shower and wash wound with dial antibacterial soap and water prior to dressing change. Peri-Wound Care: Sween Lotion (Moisturizing lotion) Discharge Instructions: Apply moisturizing lotion as directed Prim Dressing: KerraCel Ag Gelling Fiber Dressing, 2x2 in (silver alginate) ary Discharge Instructions: Apply silver alginate to wound bed as instructed Secondary Dressing:  Bordered Gauze, 2x2 in Discharge Instructions: Apply over primary dressing as directed. Secured With: 33M Medipore Public affairs consultant Surgical T 2x10 (in/yd) ape Discharge Instructions: Secure with tape as directed. 07/10/2022: There a pressure injury on his right anterior ankle at the point of flexion; he has a similar looking abrasion that has not broken the skin in the same site on the left. The right sided wound appears limited to breakdown of skin. 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 Under the crusting, there are a few small open areas along what appears to be the old suture line. The fat layer is exposed. I used a curette to debride the eschar and crust off of his great toe. There was some slough in the openings that I also debrided. I debrided the slough off of the pressure wound at his ankle. We will use silver alginate to both sites with 3 layer compression. We will also help him apply his compression stockings but apply a foam pad at the flexion point of his ankle to avoid further tissue  breakdown in this area. Follow-up in 1 week. Electronic Signature(s) Signed: 07/10/2022 3:24:44 PM By: Fredirick Maudlin MD FACS Entered By: Fredirick Maudlin on 07/10/2022 15:24:44 -------------------------------------------------------------------------------- HxROS Details [name] 07/10/2022 1:30 PM Medical Record Number: [id number] Patient Account Number: [id number] Date of Birth/Sex: Treating RN: February 19, 1941 (82 y.o. Waldron Session Primary Care Provider: [name] Other Clinician: Referring Provider: Treating Provider/Extender: Benedict Needy Weeks in Treatment: 0 Information Obtained From Patient Eyes Medical History: Positive for: Cataracts - 2019 Ear/Nose/Mouth/Throat Medical History: Positive for: Chronic sinus problems/congestion Respiratory Medical History: Positive for: Asthma; Sleep  Apnea Cardiovascular Medical History: Positive for: Hypertension; Peripheral Venous Disease Gastrointestinal Medical History: Past Medical History Notes: GERD Endocrine Medical History: Negative for: Type II Diabetes Genitourinary Medical History: Past Medical History Notes: BPH Musculoskeletal Medical History: Positive for: Osteoarthritis Past Medical History Notes: Degenerative disc disease Neurologic Medical History: Past Medical History Notes: Neuromuscular disorder HBO Extended History Items Ear/Nose/Mouth/Throat: Eyes: Chronic sinus Cataracts problems/congestion Immunizations Pneumococcal Vaccine: Received Pneumococcal Vaccination: Yes Received Pneumococcal Vaccination On or After 60th Birthday: No Implantable Devices None Family and Social History Cancer: No; Diabetes: Yes - Maternal Grandparents; Heart Disease: Yes - Father; Hereditary Spherocytosis: No; Hypertension: Yes - Father; Kidney Disease: No; Lung Disease: No; Seizures: No; Stroke: Yes - Father; Thyroid Problems: Yes - Mother,Child; Tuberculosis: No; Never smoker; Marital Status - Single; Alcohol Use: Rarely; Drug Use: No History; Caffeine Use: Daily - coffee; Financial Concerns: No; Food, Clothing or Shelter Needs: No; Support System Lacking: No; Transportation Concerns: No Electronic Signature(s) Signed: 07/10/2022 2:27:27 PM By: Fredirick Maudlin MD FACS Signed: 07/10/2022 5:00:23 PM By: Blanche East RN Previous Signature: 07/10/2022 12:18:52 PM Version By: Fredirick Maudlin MD FACS Entered By: Blanche East on 07/10/2022 14:17:14 -------------------------------------------------------------------------------- SuperBill Details [name] 07/10/2022 Medical Record Number: [id number] Patient Account Number: [id number] Date of Birth/Sex: Treating RN: 1941/08/27 (81 y.o. M) Primary Care Provider: [name] Other Clinician: Referring Provider: Treating  Provider/Extender: Benedict Needy Weeks in Treatment: 0 Diagnosis Coding ICD-10 Codes Code Description [phone] Pressure ulcer of right ankle, stage 2 L97.512 Non-pressure chronic ulcer of other part of right foot with fat layer exposed T81.31XS Disruption of external operation (surgical) wound, not elsewhere classified, sequela N18.30 Chronic kidney disease, stage 3 unspecified I87.2 Venous insufficiency (chronic) (peripheral) Facility Procedures CPT4 Code: [id number] Description: 99213 - WOUND CARE VISIT-LEV 3 EST PT Modifier: 25 Quantity: 1 CPT4 Code: [id number] Description: 65465 - DEBRIDE WOUND 1ST 20 SQ CM OR < ICD-10 Diagnosis Description L89.512 Pressure ulcer of right ankle, stage 2 L97.512 Non-pressure chronic ulcer of other part of right foot with fat layer exposed Modifier: Quantity: 1 Physician Procedures : CPT4 Code Description Modifier [id number] 81275 - WC PHYS LEVEL 4 - EST PT 25 ICD-10 Diagnosis Description L89.512 Pressure ulcer of right ankle, stage 2 L97.512 Non-pressure chronic ulcer of other part of right foot with fat layer exposed T81.31XS  Disruption of external operation (surgical) wound, not elsewhere classified, sequela I87.2 Venous insufficiency (chronic) (peripheral) Quantity: 1 : [id number] 94496 - WC PHYS DEBR WO ANESTH 20 SQ CM ICD-10 Diagnosis Description L89.512 Pressure ulcer of right ankle, stage 2 L97.512 Non-pressure chronic ulcer of other part of right foot with fat layer exposed Quantity: 1 Electronic Signature(s) Signed: 07/10/2022 5:00:23 PM By: Blanche East RN Signed: 07/11/2022 7:32:40 AM By: Fredirick Maudlin MD FACS Previous Signature: 07/10/2022 3:25:05 PM Version By: Fredirick Maudlin MD  FACS Entered By: Blanche East on 07/10/2022 17:00:02

## 2022-07-15 DIAGNOSIS — Z23 Encounter for immunization: Secondary | ICD-10-CM | POA: Diagnosis not present

## 2022-07-18 ENCOUNTER — Encounter (HOSPITAL_BASED_OUTPATIENT_CLINIC_OR_DEPARTMENT_OTHER): Payer: Medicare Other | Admitting: General Surgery

## 2022-07-21 ENCOUNTER — Encounter (HOSPITAL_BASED_OUTPATIENT_CLINIC_OR_DEPARTMENT_OTHER): Payer: Medicare Other | Attending: General Surgery | Admitting: General Surgery

## 2022-07-21 DIAGNOSIS — L89512 Pressure ulcer of right ankle, stage 2: Secondary | ICD-10-CM | POA: Diagnosis not present

## 2022-07-21 DIAGNOSIS — N183 Chronic kidney disease, stage 3 unspecified: Secondary | ICD-10-CM | POA: Diagnosis not present

## 2022-07-21 DIAGNOSIS — L97812 Non-pressure chronic ulcer of other part of right lower leg with fat layer exposed: Secondary | ICD-10-CM | POA: Insufficient documentation

## 2022-07-21 DIAGNOSIS — I129 Hypertensive chronic kidney disease with stage 1 through stage 4 chronic kidney disease, or unspecified chronic kidney disease: Secondary | ICD-10-CM | POA: Insufficient documentation

## 2022-07-21 DIAGNOSIS — L97312 Non-pressure chronic ulcer of right ankle with fat layer exposed: Secondary | ICD-10-CM | POA: Diagnosis not present

## 2022-07-21 DIAGNOSIS — T8131XA Disruption of external operation (surgical) wound, not elsewhere classified, initial encounter: Secondary | ICD-10-CM | POA: Diagnosis not present

## 2022-07-21 DIAGNOSIS — I872 Venous insufficiency (chronic) (peripheral): Secondary | ICD-10-CM | POA: Insufficient documentation

## 2022-07-21 DIAGNOSIS — T8781 Dehiscence of amputation stump: Secondary | ICD-10-CM | POA: Insufficient documentation

## 2022-07-21 DIAGNOSIS — Y835 Amputation of limb(s) as the cause of abnormal reaction of the patient, or of later complication, without mention of misadventure at the time of the procedure: Secondary | ICD-10-CM | POA: Diagnosis not present

## 2022-07-21 NOTE — Progress Notes (Signed)
[name], [name] ([id number]) Visit Report for 07/21/2022 Arrival Information Details Patient Name: [name]: [name], [name] [name] 07/21/2022 2:45 PM Medical Record Number: [id number] Patient Account Number: [id number] Date of Birth/Sex: Treating RN: 17-Mar-1941 (81 y.o. [name] Primary Care Aldora Perman: [name] Other Clinician: Referring Jaleeyah Munce: Treating Lacresia Darwish/Extender: Benedict Needy Weeks in Treatment: 1 Visit Information History Since Last Visit Added or deleted any medications: No Patient Arrived: [name] Any new allergies or adverse reactions: No Arrival Time: 15:07 Had a fall or experienced change in No Accompanied By: self activities of daily living that may affect Transfer Assistance: None risk of falls: Patient Identification Verified: Yes Signs or symptoms of abuse/neglect since last visito No Secondary Verification Process Completed: Yes Hospitalized since last visit: No Patient Requires Transmission-Based Precautions: No Has Dressing in Place as Prescribed: Yes Patient Has Alerts: Yes Has Compression in Place as Prescribed: Yes Patient Alerts: ABI LLE 1.16, RLE 1.14 Pain Present Now: No Electronic Signature(s) Signed: 07/21/2022 4:51:35 PM By: Baruch Gouty RN, BSN Entered By: Baruch Gouty on 07/21/2022 15:13:41 -------------------------------------------------------------------------------- Encounter Discharge Information Details Patient Name: [name]: [name] Banker [name] 07/21/2022 2:45 PM Medical Record Number: [id number] Patient Account Number: [id number] Date of Birth/Sex: Treating RN: December 22, 1940 (81 y.o. [name] Primary Care Loreli Debruler: [name] Other Clinician: Referring Shellene Sweigert: Treating Jaimere Feutz/Extender: Benedict Needy Weeks in Treatment: 1 Encounter Discharge Information Items Post Procedure Vitals Discharge Condition: Stable Temperature (F): 98.3 Ambulatory  Status: Cane Pulse (bpm): 84 Discharge Destination: Home Respiratory Rate (breaths/min): 18 Transportation: Private Auto Blood Pressure (mmHg): 145/79 Accompanied By: self Schedule Follow-up Appointment: Yes Clinical Summary of Care: Patient Declined Electronic Signature(s) Signed: 07/21/2022 4:51:35 PM By: Baruch Gouty RN, BSN Entered By: Baruch Gouty on 07/21/2022 15:55:43 -------------------------------------------------------------------------------- Lower Extremity Assessment Details Patient Name: [name]: [name] Banker [name] 07/21/2022 2:45 PM Medical Record Number: [id number] Patient Account Number: [id number] Date of Birth/Sex: Treating RN: [birth year] (81 y.o. [name] Primary Care Thiago Ragsdale: [name] Other Clinician: Referring Bitha Fauteux: Treating Jasha Hodzic/Extender: Benedict Needy Weeks in Treatment: 1 Edema Assessment Assessed: [Left: No] [Right: No] E[Left: dema] [Right: :] Calf Left: Right: Point of Measurement: From Medial Instep 36 cm 34.7 cm Ankle Left: Right: Point of Measurement: From Medial Instep 26 cm 24.5 cm Vascular Assessment Pulses: Dorsalis Pedis Palpable: [Left:Yes] [Right:Yes] Electronic Signature(s) Signed: 07/21/2022 4:51:35 PM By: Baruch Gouty RN, BSN Entered By: Baruch Gouty on 07/21/2022 15:20:28 -------------------------------------------------------------------------------- Multi Wound Chart Details Patient Name: [name]: [name] Banker [name] 07/21/2022 2:45 PM Medical Record Number: [id number] Patient Account Number: [id number] Date of Birth/Sex: Treating RN: 09-01-1941 (81 y.o. M) Primary Care Jarrod Mcenery: [name] Other Clinician: Referring Briscoe Daniello: Treating Chike Farrington/Extender: Benedict Needy Weeks in Treatment: 1 Vital Signs Height(in): 68 Pulse(bpm): 32 Weight(lbs): 245 Blood Pressure(mmHg): 145/79 Body Mass Index(BMI):  37.2 Temperature(F): 98.3 Respiratory Rate(breaths/min): 20 Photos: [7:Right, Anterior Ankle] [8:Right Amputation Site - Toe] [N/A:N/A N/A] Wound Location: [7:Footwear Injury] [8:Skin T ear/Laceration] [N/A:N/A] Wounding Event: [7:Venous Leg Ulcer] [8:Open Surgical Wound] [N/A:N/A] Primary Etiology: [7:Cataracts, Chronic sinus] [8:Cataracts, Chronic sinus] [N/A:N/A] Comorbid History: [7:problems/congestion, Asthma, Sleep problems/congestion, Asthma, Sleep Apnea, Hypertension, Peripheral Venous Disease, Osteoarthritis 07/03/2022] [8:Apnea, Hypertension, Peripheral Venous Disease, Osteoarthritis 07/03/2022] [N/A:N/A] Date Acquired: [7:1] [8:1] [N/A:N/A] Weeks of Treatment: [7:Open] [8:Healed - Epithelialized] [N/A:N/A] Wound Status: [7:No] [8:No] [N/A:N/A] Wound Recurrence: [7:0.5x0.8x0.1] [8:0x0x0] [N/A:N/A] Measurements L x W x D (cm) [7:0.314] [8:0] [N/A:N/A] A (cm) :  rea [7:0.031] [8:0] [N/A:N/A] Volume (cm) : [7:77.80%] [8:100.00%] [N/A:N/A] % Reduction in A [7:rea: 78.00%] [8:100.00%] [N/A:N/A] % Reduction in Volume: [7:Full Thickness Without Exposed] [8:Full Thickness Without Exposed] [N/A:N/A] Classification: [7:Support Structures Medium] [8:Support Structures None Present] [N/A:N/A] Exudate A mount: [7:Serosanguineous] [8:N/A] [N/A:N/A] Exudate Type: [7:red, brown] [8:N/A] [N/A:N/A] Exudate Color: [7:Flat and Intact] [8:N/A] [N/A:N/A] Wound Margin: [7:Small (1-33%)] [8:None Present (0%)] [N/A:N/A] Granulation A mount: [7:Red, Pink] [8:N/A] [N/A:N/A] Granulation Quality: [7:Large (67-100%)] [8:None Present (0%)] [N/A:N/A] Necrotic A mount: [7:Fat Layer (Subcutaneous Tissue): Yes Fascia: No] [N/A:N/A] Exposed Structures: [7:Fascia: No Tendon: No Muscle: No Joint: No Bone: No Small (1-33%)] [8:Fat Layer (Subcutaneous Tissue): No Tendon: No Muscle: No Joint: No Bone: No Large (67-100%)] [N/A:N/A] Epithelialization: [7:Debridement - Excisional] [8:Debridement - Selective/Open Wound]  [N/A:N/A] Debridement: Pre-procedure Verification/Time Out 15:35 [8:15:35] [N/A:N/A] Taken: [7:Lidocaine 4% Topical Solution] [8:Lidocaine 4% Topical Solution] [N/A:N/A] Pain Control: [7:Subcutaneous, Slough] [8:Necrotic/Eschar] [N/A:N/A] Tissue Debrided: [7:Skin/Subcutaneous Tissue] [8:Non-Viable Tissue] [N/A:N/A] Level: [7:0.4] [8:0.5] [N/A:N/A] Debridement A (sq cm): [7:rea Curette] [8:Curette] [N/A:N/A] Instrument: [7:Minimum] [8:Minimum] [N/A:N/A] Bleeding: [7:Pressure] [8:Pressure] [N/A:N/A] Hemostasis A chieved: [7:0] [8:0] [N/A:N/A] Procedural Pain: [7:0] [8:0] [N/A:N/A] Post Procedural Pain: [7:Procedure was tolerated well] [8:Procedure was tolerated well] [N/A:N/A] Debridement Treatment Response: [7:0.5x0.8x0.1] [8:0x0x0] [N/A:N/A] Post Debridement Measurements L x W x D (cm) [7:0.031] [8:0] [N/A:N/A] Post Debridement Volume: (cm) [7:Debridement] [8:Debridement] [N/A:N/A] Treatment Notes Wound #7 (Ankle) Wound Laterality: Right, Anterior Cleanser Soap and Water Discharge Instruction: May shower and wash wound with dial antibacterial soap and water prior to dressing change. Peri-Wound Care Topical Primary Dressing KerraCel Ag Gelling Fiber Dressing, 2x2 in (silver alginate) Discharge Instruction: Apply silver alginate to wound bed as instructed Secondary Dressing ALLEVYN Gentle Border, 4x4 (in/in) Discharge Instruction: Apply over primary dressing as directed. Secured With Compression Wrap Compression Stockings Add-Ons Wound #8 (Amputation Site - Toe) Wound Laterality: Right Cleanser Peri-Wound Care Topical Primary Dressing Secondary Dressing Secured With Compression Wrap Compression Stockings Add-Ons Electronic Signature(s) Signed: 07/21/2022 4:05:12 PM By: Fredirick Maudlin MD FACS Entered By: Fredirick Maudlin on 07/21/2022 16:05:11 -------------------------------------------------------------------------------- Multi-Disciplinary Care Plan Details Patient  Name: [name]: [name] Banker [name] 07/21/2022 2:45 PM Medical Record Number: [id number] Patient Account Number: [id number] Date of Birth/Sex: Treating RN: Jul 26, 1941 (81 y.o. [name] Primary Care Gianah Batt: [name] Other Clinician: Referring Mehreen Azizi: Treating Bethani Brugger/Extender: Benedict Needy Weeks in Treatment: 1 Active Inactive Venous Leg Ulcer Nursing Diagnoses: Potential for venous Insuffiency (use before diagnosis confirmed) Goals: Patient will maintain optimal edema control Date Initiated: 07/10/2022 Target Resolution Date: 08/07/2022 Goal Status: Active Interventions: Assess peripheral edema status every visit. Treatment Activities: Therapeutic compression applied : 07/10/2022 Notes: Wound/Skin Impairment Nursing Diagnoses: Impaired tissue integrity Goals: Ulcer/skin breakdown will have a volume reduction of 30% by week 4 Date Initiated: 07/10/2022 Target Resolution Date: 08/07/2022 Goal Status: Active Interventions: Provide education on ulcer and skin care Treatment Activities: Skin care regimen initiated : 07/10/2022 Topical wound management initiated : 07/10/2022 Notes: Electronic Signature(s) Signed: 07/21/2022 4:51:35 PM By: Baruch Gouty RN, BSN Entered By: Baruch Gouty on 07/21/2022 15:30:55 -------------------------------------------------------------------------------- Pain Assessment Details Patient Name: [name]: [name] Banker [name] 07/21/2022 2:45 PM Medical Record Number: [id number] Patient Account Number: [id number] Date of Birth/Sex: Treating RN: 11-11-1940 (81 y.o. [name] Primary Care Derrick Tiegs: [name] Other Clinician: Referring Carlester Kasparek: Treating Ashleyann Shoun/Extender: Benedict Needy Weeks in Treatment: 1 Active Problems Location of Pain Severity and Description of Pain Patient Has Paino No Site Locations Rate the pain.  Current Pain Level: 0 Pain  Management and Medication Current Pain Management: Electronic Signature(s) Signed: 07/21/2022 4:51:35 PM By: Baruch Gouty RN, BSN Entered By: Baruch Gouty on 07/21/2022 15:14:17 -------------------------------------------------------------------------------- Patient/Caregiver Education Details Patient Name: [name]: [name] Banker [name] 9/11/2023andnbsp2:45 PM Medical Record Number: [id number] Patient Account Number: [id number] Date of Birth/Gender: Treating RN: 07/24/1941 (81 y.o. [name] Primary Care Physician: [name] Other Clinician: Referring Physician: Treating Physician/Extender: Benedict Needy Weeks in Treatment: 1 Education Assessment Education Provided To: Patient Education Topics Provided Venous: Methods: Explain/Verbal Responses: Reinforcements needed, State content correctly Wound/Skin Impairment: Methods: Explain/Verbal Responses: Reinforcements needed, State content correctly Electronic Signature(s) Signed: 07/21/2022 4:51:35 PM By: Baruch Gouty RN, BSN Entered By: Baruch Gouty on 07/21/2022 15:31:15 -------------------------------------------------------------------------------- Wound Assessment Details Patient Name: [name]: [name] Banker [name] 07/21/2022 2:45 PM Medical Record Number: [id number] Patient Account Number: [id number] Date of Birth/Sex: Treating RN: 24-Sep-1941 (81 y.o. [name] Primary Care Petula Rotolo: [name] Other Clinician: Referring Phaedra Colgate: Treating Jonetta Dagley/Extender: Benedict Needy Weeks in Treatment: 1 Wound Status Wound Number: 7 Primary Venous Leg Ulcer Etiology: Wound Location: Right, Anterior Ankle Wound Open Wounding Event: Footwear Injury Status: Date Acquired: 07/03/2022 Comorbid Cataracts, Chronic sinus problems/congestion, Asthma, Sleep Weeks Of Treatment: 1 History: Apnea, Hypertension, Peripheral Venous Disease,  Osteoarthritis Clustered Wound: No Photos Wound Measurements Length: (cm) 0.5 Width: (cm) 0.8 Depth: (cm) 0.1 Area: (cm) 0.314 Volume: (cm) 0.031 % Reduction in Area: 77.8% % Reduction in Volume: 78% Epithelialization: Small (1-33%) Tunneling: No Undermining: No Wound Description Classification: Full Thickness Without Exposed Support Structures Wound Margin: Flat and Intact Exudate Amount: Medium Exudate Type: Serosanguineous Exudate Color: red, brown Foul Odor After Cleansing: No Slough/Fibrino Yes Wound Bed Granulation Amount: Small (1-33%) Exposed Structure Granulation Quality: Red, Pink Fascia Exposed: No Necrotic Amount: Large (67-100%) Fat Layer (Subcutaneous Tissue) Exposed: Yes Necrotic Quality: Adherent Slough Tendon Exposed: No Muscle Exposed: No Joint Exposed: No Bone Exposed: No Treatment Notes Wound #7 (Ankle) Wound Laterality: Right, Anterior Cleanser Soap and Water Discharge Instruction: May shower and wash wound with dial antibacterial soap and water prior to dressing change. Peri-Wound Care Topical Primary Dressing KerraCel Ag Gelling Fiber Dressing, 2x2 in (silver alginate) Discharge Instruction: Apply silver alginate to wound bed as instructed Secondary Dressing ALLEVYN Gentle Border, 4x4 (in/in) Discharge Instruction: Apply over primary dressing as directed. Secured With Compression Wrap Compression Stockings Environmental education officer) Signed: 07/21/2022 4:51:35 PM By: Baruch Gouty RN, BSN Signed: 07/21/2022 5:14:23 PM By: Adline Peals Entered By: Adline Peals on 07/21/2022 15:28:04 -------------------------------------------------------------------------------- Wound Assessment Details Patient Name: [name]: [name] Banker [name] 07/21/2022 2:45 PM Medical Record Number: [id number] Patient Account Number: [id number] Date of Birth/Sex: Treating RN: 24-Apr-1941 (81 y.o. 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 Wound Healed - Epithelialized Wounding Event: Skin Tear/Laceration Status: Date Acquired: 07/03/2022 Comorbid Cataracts, Chronic sinus problems/congestion, Asthma, Sleep Weeks Of Treatment: 1 History: Apnea, Hypertension, Peripheral Venous Disease, Osteoarthritis Clustered Wound: No Photos Wound Measurements Length: (cm) Width: (cm) Depth: (cm) Area: (cm) Volume: (cm) 0 % Reduction in Area: 100% 0 % Reduction in Volume: 100% 0 Epithelialization: Large (67-100%) 0 Tunneling: No 0 Undermining: No Wound Description Classification: Full Thickness Without Exposed Support Structures Exudate Amount: None Present Foul Odor After  Cleansing: No Slough/Fibrino No Wound Bed Granulation Amount: None Present (0%) Exposed Structure Necrotic Amount: None Present (0%) Fascia Exposed: No Fat Layer (Subcutaneous Tissue) Exposed: No Tendon Exposed: No Muscle Exposed: No Joint Exposed: No Bone Exposed: No Electronic Signature(s) Signed: 07/21/2022 4:51:35 PM By: Baruch Gouty RN, BSN Signed: 07/21/2022 5:14:23 PM By: Adline Peals Entered By: Adline Peals on 07/21/2022 15:28:48 -------------------------------------------------------------------------------- Vitals Details Patient Name: [name]: [name] Banker [name] 07/21/2022 2:45 PM Medical Record Number: [id number] Patient Account Number: [id number] Date of Birth/Sex: Treating RN: May 30, 1941 (81 y.o. [name] Primary Care Joana Nolton: [name] Other Clinician: Referring Nicholas Ossa: Treating Shyheem Whitham/Extender: Benedict Needy Weeks in Treatment: 1 Vital Signs Time Taken: 15:13 Temperature (F): 98.3 Height (in): 68 Pulse (bpm):  84 Weight (lbs): 245 Respiratory Rate (breaths/min): 20 Body Mass Index (BMI): 37.2 Blood Pressure (mmHg): 145/79 Reference Range: 80 - 120 mg / dl Electronic Signature(s) Signed: 07/21/2022 4:51:35 PM By: Baruch Gouty RN, BSN Entered By: Baruch Gouty on 07/21/2022 15:14:06

## 2022-07-21 NOTE — Progress Notes (Addendum)
[name], [name] ([id number]) Visit Report for 07/21/2022 Chief Complaint Document Details Patient Name: [name]: [name] [name] 07/21/2022 2:45 PM Medical Record Number: [id number] Patient Account Number: [id number] Date of Birth/Sex: Treating RN: Oct 26, 1941 (81 y.o. M) Primary Care Provider: [name] Other Clinician: Referring Provider: Treating Provider/Extender: [name] in Treatment: 1 Information Obtained from: Patient Chief Complaint 02/07/2021; patient is here for review of a wound on the tip of his right great toe 03/19/2022: re-opening of the right great toe wound, with additional wounds on medial aspect of right great toe and right anterior tibial surface 07/10/2022: Partial dehiscence of right great toe amputation site, pressure ulcer of anterior right ankle Electronic Signature(s) Signed: 07/21/2022 4:05:19 PM By: Fredirick Maudlin MD FACS Entered By: Fredirick Maudlin on 07/21/2022 16:05:19 -------------------------------------------------------------------------------- Debridement Details Patient Name: [name]: [name] [name] 07/21/2022 2:45 PM Medical Record Number: [id number] Patient Account Number: [id number] Date of Birth/Sex: Treating RN: Mar 10, 1941 (81 y.o. [name] Primary Care Provider: [name] Other Clinician: Referring Provider: Treating Provider/Extender: [name] in Treatment: 1 Debridement Performed for Assessment: Wound #7 Right,Anterior Ankle Performed By: Physician Fredirick Maudlin, MD Debridement Type: Debridement Severity of Tissue Pre Debridement: Fat layer exposed Level of Consciousness (Pre-procedure): Awake and Alert Pre-procedure Verification/Time Out Yes - 15:35 Taken: Start Time: 15:35 Pain Control: Lidocaine 4% T opical Solution T Area Debrided (L x W): otal 0.5 (cm) x 0.8 (cm) = 0.4 (cm) Tissue and other material debrided: Viable,  Non-Viable, Slough, Subcutaneous, Slough Level: Skin/Subcutaneous Tissue Debridement Description: Excisional Instrument: Curette Bleeding: Minimum Hemostasis Achieved: Pressure Procedural Pain: 0 Post Procedural Pain: 0 Response to Treatment: Procedure was tolerated well Level of Consciousness (Post- Awake and Alert procedure): Post Debridement Measurements of Total Wound Length: (cm) 0.5 Width: (cm) 0.8 Depth: (cm) 0.1 Volume: (cm) 0.031 Character of Wound/Ulcer Post Debridement: Improved Severity of Tissue Post Debridement: Fat layer exposed Post Procedure Diagnosis Same as Pre-procedure Notes scribed by Baruch Gouty, RN for Dr. Celine Ahr Electronic Signature(s) Signed: 07/21/2022 4:36:06 PM By: Fredirick Maudlin MD FACS Signed: 07/21/2022 4:51:35 PM By: Baruch Gouty RN, BSN Entered By: Baruch Gouty on 07/21/2022 16:21:20 -------------------------------------------------------------------------------- Debridement Details Patient Name: [name]: [name] [name] 07/21/2022 2:45 PM Medical Record Number: [id number] Patient Account Number: [id number] Date of Birth/Sex: Treating RN: 1941/06/05 (81 y.o. [name] Primary Care Provider: [name] Other Clinician: Referring Provider: Treating Provider/Extender: [name] in Treatment: 1 Debridement Performed for Assessment: Wound #8 Right Amputation Site - Toe Performed By: Physician Fredirick Maudlin, MD Debridement Type: Debridement Level of Consciousness (Pre-procedure): Awake and Alert Pre-procedure Verification/Time Out Yes - 15:35 Taken: Start Time: 15:35 Pain Control: Lidocaine 4% T opical Solution T Area Debrided (L x W): otal 0.5 (cm) x 1 (cm) = 0.5 (cm) Tissue and other material debrided: Non-Viable, Eschar Level: Non-Viable Tissue Debridement Description: Selective/Open Wound Instrument: Curette Bleeding: Minimum Hemostasis Achieved:  Pressure Procedural Pain: 0 Post Procedural Pain: 0 Response to Treatment: Procedure was tolerated well Level of Consciousness (Post- Awake and Alert procedure): Post Debridement Measurements of Total Wound Length: (cm) 0 Width: (cm) 0 Depth: (cm) 0 Volume: (cm) 0 Character of Wound/Ulcer Post Debridement: Improved Post Procedure Diagnosis Same as Pre-procedure Notes scribed by Baruch Gouty, RN for Dr. Celine Ahr Electronic Signature(s) Signed: 07/21/2022 4:36:06 PM By: Fredirick Maudlin MD FACS Signed: 07/21/2022 4:51:35 PM By: Baruch Gouty RN, BSN Entered By: Johna Roles,  Linda on 07/21/2022 16:21:30 -------------------------------------------------------------------------------- HPI Details Patient Name: [name]: [name] [name] 07/21/2022 2:45 PM Medical Record Number: [id number] Patient Account Number: [id number] Date of Birth/Sex: Treating RN: January 21, 1941 (81 y.o. M) Primary Care Provider: [name] Other Clinician: Referring Provider: Treating Provider/Extender: [name] in Treatment: 1 History of Present Illness HPI Description: ADMISSION 02/07/2021 This is a 81 year old man who is not a diabetic "prediabetic" who comes in today letter asking [place] to look at an area on his right first toe tip that has been there about a year. He has been getting this dressed that Eagle and he had Xeroform on this with gauze. The patient is not exactly sure how this came about but he is not able to dress the wound himself because of limitations due to what sounds like spinal stenosis and pain. He had a wound on the ankle as well but that healed over. He was seen by vascular in May 2021. This was related to a right great toe wound. He had an angiogram that was completely normal on both sides good perfusion right into the feet. He also looks like he has lymphedema and he has compression stockings but he cannot get them on and off so he  essentially leaves he is on even when he showering Past medical history includes prediabetes, low back pain secondary I think the lumbar spinal stenosis has been offered a decompressive laminectomy but he has not gone forth with this. 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 ABI was noncompressible in our clinic on the right right first toe. 4/7; patient I admitted the clinic last week. He has an area on the tip of his right first toe. We cleaned this off last week and have been using silver collagen. He is in the same crocs today that he wore last week and I told him not to use. For 1 reason or another he could not handle a surgical sandal. 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 He is not a diabetic 4/12; right first toe wound chronic. We have been using silver collagen. Once again he comes in then the same pair of old crocs that he comes in every week. 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 His x-ray suggested suspicious for osteomyelitis with bone irregularity at the tuft of the right great toe 4/21; patient has been using silver collagen every other day to the wound on his right great toe. He continues to wear crocs. He has no complaints or issues today. 5/5; 2-week follow-up. 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 Mild soft tissue edema with enhancement at the distal aspect of the great toe suggestive of cellulitis. Fortunately the wound actually looks better. Has been using Hydrofera Blue 5/12; started him on Augmentin last week 875 twice daily he is tolerating this well. Last creatinine I see is 1.77 in early April. We are using Hydrofera Blue to the wound which actually looks better today. 5/17; tolerating the Augmentin well. He says the wound is actually been hurting him episodically.  Actually the surface of this looks quite good. He did not get his lab work done. We are using [facility] he is changing this himself 6/2; still taking Augmentin he should be rounding into  his last week. This was empirically for underlying osteomyelitis. The area on the tip of his toe is still open still with some depth but no palpable bone. We have been using Hydrofera Blue. Surface area of the wound has not been making much improvement Is work was essentially unremarkable except for a creatinine of 1.79. Reason for his renal insufficiency is not clear he is not a diabetic does not have hypertension however he does take NSAIDs and Lasix I wonder if this is the issue here. Note that his creatinine on 11/12/2018 was 1.45. Platelet count slightly low at 143 however his inflammatory markers were really in the normal range at 10 and 2 sedimentation rate and C-reactive protein respectively. 6/9; he apparently had another 2 Hanson of Augmentin after we look that this last time. Therefore he should be coming into his last week next week. We are using polymen on the tip of the toe. He cannot get down to do the dressing so he comes in for a nurse visit. He comes in in crocs I have warned him against this although he says he is using a different shoe for most of the day he works in [employer]. 6/16; he comes back in in crocs. I have spoken him about this before. His areas on the tip of his toe this actually looks quite good we have been using polymen. He has completed his antibiotics which we empirically gave him for underlying osteomyelitis. Everything looks better to me in spite of his noncompliance with footwear recommendation 7/11; patient has been followed through nurse visits. He was last seen by provider on 6/16. He reports improvement to the wound with the use of PolyMem. He denies signs of infection. 7/21; the patient arrives with the area on the tip of his right great toe completely healed. He has  the same proximal on that I see every time. I treated him empirically with Augmentin for osteomyelitis. He needs to be vigilant about this. READMISSION 03/19/2022 The patient returns with a reopening of the right great toe wound. Apparently this occurred in around October of last year. He was followed by podiatry for some time. They have performed x-rays but unfortunately, we do not have access to them or the results. No report as to whether or not he might have osteomyelitis. In addition, he has a new wound on the medial aspect of the right great toe as well as the anterior tibial surface of his right lower leg. He does state that he was wearing compression stockings when this tibial wound occurred. He continues to wear crocs. 04/02/2022: For some reason, the patient has not been seen in 2 Hanson. He says that he had to reschedule his visit. Therefore his Coflex compression wrap was left in situ for 14 days. It appears that his skin became macerated and he has opened up several new superficial wounds on his right anterior tibial surface. We did get an x-ray to evaluate for osteomyelitis and none was seen. All of the pre-existing wounds are smaller today and the new wounds look like they are limited to just the breakdown of skin. 04/10/2022: The wounds on his right anterior tibial surface are actually a little bit larger today. He seems to have some skin irritation and maceration in the area again. The tip of his toe is nearly closed as is the wound on the dorsal surface of his right great toe. READMISSION 07/10/2022 Since our last visit, the patient went to the operating room to  have a left hip replacement, but in the preop holding area, his surgeon noted a foul odor coming from his right foot. He contacted me and described the findings of his right great toe and we mutually agreed, along with the patient, that amputation would be most appropriate. 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 Since his surgery, he has been wearing compression stockings bilaterally, but as he is unable to apply or remove them himself, he wears them for a week at a time. This appears to have resulted in a pressure injury on his right anterior ankle at the point of flexion; he has a similar looking abrasion that has not broken the skin in the same site on the left. 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 07/21/2022: The right anterior ankle wound is nearly closed aside from a small open area that has some slough accumulation. The amputation site has crusting and eschar present. Once this was removed, it was revealed that the amputation site has healed. The abrasion on the left anterior ankle at the point of flexion is no longer present. Electronic Signature(s) Signed: 07/21/2022 4:06:29 PM By: Fredirick Maudlin MD FACS Entered By: Fredirick Maudlin on 07/21/2022 16:06:29 -------------------------------------------------------------------------------- Physical Exam Details Patient Name: [name]: [name] [name] 07/21/2022 2:45 PM Medical Record Number: [id number] Patient Account Number: [id number] Date of Birth/Sex: Treating RN: 1941-10-20 (81 y.o. M) Primary Care Provider: [name] Other Clinician: Referring Provider: Treating Provider/Extender: [name] in Treatment: 1 Constitutional Slightly hypertensive. . . . No acute distress.[name] Respiratory Normal work of breathing on room air.. Notes 07/21/2022: The right anterior ankle wound is nearly closed aside from a small open area that has some slough accumulation. The amputation site has crusting and eschar present. Once this was removed, it was revealed that the amputation site has healed. The abrasion on the left anterior ankle at the point of flexion is no longer present. Electronic Signature(s) Signed: 07/21/2022 4:09:20  PM By: Fredirick Maudlin MD FACS Entered By: Fredirick Maudlin on 07/21/2022 16:09:20 -------------------------------------------------------------------------------- Physician Orders Details Patient Name: [name]: [name] [name] 07/21/2022 2:45 PM Medical Record Number: [id number] Patient Account Number: [id number] Date of Birth/Sex: Treating RN: 10/17/1941 (81 y.o. [name] Primary Care Provider: [name] Other Clinician: Referring Provider: Treating Provider/Extender: [name] in Treatment: 1 Verbal / Phone Orders: No Diagnosis Coding ICD-10 Coding Code Description [phone] Pressure ulcer of right ankle, stage 2 L97.512 Non-pressure chronic ulcer of other part of right foot with fat layer exposed T81.31XS Disruption of external operation (surgical) wound, not elsewhere classified, sequela N18.30 Chronic kidney disease, stage 3 unspecified I87.2 Venous insufficiency (chronic) (peripheral) Follow-up Appointments ppointment in 1 week. - Dr. Celine Ahr- Rm 4 Return A Anesthetic Wound #7 Right,Anterior Ankle (In clinic) Topical Lidocaine 4% applied to wound bed Bathing/ Shower/ Hygiene May shower and wash wound with soap and water. Edema Control - Lymphedema / SCD / Other Left Lower Extremity Elevate legs to the level of the heart or above for 30 minutes daily and/or when sitting, a frequency of: - throughout the day Avoid standing for long periods of time. Exercise regularly Moisturize legs daily. - both legs nightly Compression stocking or Garment 20-30 mm/Hg pressure to: - both legs daily Wound Treatment Wound #7 - Ankle Wound Laterality: Right, Anterior Cleanser: Soap and Water 1  x Per Week Discharge Instructions: May shower and wash wound with dial antibacterial soap and water prior to dressing change. Prim Dressing: KerraCel Ag Gelling Fiber Dressing, 2x2 in (silver alginate) 1 x Per Week ary Discharge Instructions:  Apply silver alginate to wound bed as instructed Secondary Dressing: ALLEVYN Gentle Border, 4x4 (in/in) 1 x Per Week Discharge Instructions: Apply over primary dressing as directed. Electronic Signature(s) Signed: 07/21/2022 4:36:06 PM By: Fredirick Maudlin MD FACS Entered By: Fredirick Maudlin on 07/21/2022 16:09:38 -------------------------------------------------------------------------------- Problem List Details Patient Name: [name]: [name] [name] 07/21/2022 2:45 PM Medical Record Number: [id number] Patient Account Number: [id number] Date of Birth/Sex: Treating RN: 20-Nov-1940 (81 y.o. [name] Primary Care Provider: [name] Other Clinician: Referring Provider: Treating Provider/Extender: [name] in Treatment: 1 Active Problems ICD-10 Encounter Code Description Active Date MDM Diagnosis L89.512 Pressure ulcer of right ankle, stage 2 07/10/2022 No Yes L97.512 Non-pressure chronic ulcer of other part of right foot with fat layer exposed 07/10/2022 No Yes T81.31XS Disruption of external operation (surgical) wound, not elsewhere classified, 07/10/2022 No Yes sequela N18.30 Chronic kidney disease, stage 3 unspecified 07/10/2022 No Yes I87.2 Venous insufficiency (chronic) (peripheral) 07/10/2022 No Yes Inactive Problems Resolved Problems Electronic Signature(s) Signed: 07/21/2022 3:33:53 PM By: Fredirick Maudlin MD FACS Entered By: Fredirick Maudlin on 07/21/2022 15:33:53 -------------------------------------------------------------------------------- Progress Note Details Patient Name: [name]: [name] [name] 07/21/2022 2:45 PM Medical Record Number: [id number] Patient Account Number: [id number] Date of Birth/Sex: Treating RN: [birth year] (81 y.o. M) Primary Care Provider: [name] Other Clinician: Referring Provider: Treating Provider/Extender: [name] in Treatment:  1 Subjective Chief Complaint Information obtained from Patient 02/07/2021; patient is here for review of a wound on the tip of his right great toe 03/19/2022: re-opening of the right great toe wound, with additional wounds on medial aspect of right great toe and right anterior tibial surface 07/10/2022: Partial dehiscence of right great toe amputation site, pressure ulcer of anterior right ankle History of Present Illness (HPI) ADMISSION 02/07/2021 This is a 81 year old man who is not a diabetic "prediabetic" who comes in today letter asking [place] to look at an area on his right first toe tip that has been there about a year. He has been getting this dressed that [employer] and he had Xeroform on this with gauze. The patient is not exactly sure how this came about but he is not able to dress the wound himself because of limitations due to what sounds like spinal stenosis and pain. He had a wound on the ankle as well but that healed over. He was seen by vascular in May 2021. This was related to a right great toe wound. He had an angiogram that was completely normal on both sides good perfusion right into the feet. He also looks like he has lymphedema and he has compression stockings but he cannot get them on and off so he essentially leaves he is on even when he showering Past medical history includes prediabetes, low back pain secondary I think the lumbar spinal stenosis has been offered a decompressive laminectomy but he has not gone forth with this. 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 ABI was noncompressible in our clinic on the right right first toe. 4/7; patient I admitted the clinic last week. He has an area on the tip of his right first toe.  We cleaned this off last week and have been using silver collagen. He is in the same crocs today that he wore last week and I told him not to use. For 1 reason or another he could not handle a  surgical sandal. 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 He is not a diabetic 4/12; right first toe wound chronic. We have been using silver collagen. Once again he comes in then the same pair of old crocs that he comes in every week. 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 His x-ray suggested suspicious for osteomyelitis with bone irregularity at the tuft of the right great toe 4/21; patient has been using silver collagen every other day to the wound on his right great toe. He continues to wear crocs. He has no complaints or issues today. 5/5; 2-week follow-up. 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 Mild soft tissue edema with enhancement at the distal aspect of the great toe suggestive of cellulitis. Fortunately the wound actually looks better. Has been using Hydrofera Blue 5/12; started him on Augmentin last week 875 twice daily he is tolerating this well. Last creatinine I see is 1.77 in early April. We are using Hydrofera Blue to the wound which actually looks better today. 5/17; tolerating the Augmentin well. He says the wound is actually been hurting him episodically. Actually the surface of this looks quite good. He did not get his lab work done. We are using [facility] he is changing this himself 6/2; still taking Augmentin he should be rounding into his last week. This was empirically for underlying osteomyelitis. The area on the tip of his toe is still open still with some depth but no palpable bone. We have been using Hydrofera Blue. Surface area of the wound has not been making much improvement Is work was essentially unremarkable except for a creatinine of 1.79. Reason for his renal insufficiency is not clear he is not a diabetic does not have hypertension however he does take NSAIDs and Lasix I wonder if this is the issue here. Note that his  creatinine on 11/12/2018 was 1.45. Platelet count slightly low at 143 however his inflammatory markers were really in the normal range at 10 and 2 sedimentation rate and C-reactive protein respectively. 6/9; he apparently had another 2 Hanson of Augmentin after we look that this last time. Therefore he should be coming into his last week next week. We are using polymen on the tip of the toe. He cannot get down to do the dressing so he comes in for a nurse visit. He comes in in crocs I have warned him against this although he says he is using a different shoe for most of the day he works in [employer]. 6/16; he comes back in in crocs. I have spoken him about this before. His areas on the tip of his toe this actually looks quite good we have been using polymen. He has completed his antibiotics which we empirically gave him for underlying osteomyelitis. Everything looks better to me in spite of his noncompliance with footwear recommendation 7/11; patient has been followed through nurse visits. He was last seen by provider on 6/16. He reports improvement to the wound with the use of PolyMem. He denies signs of infection. 7/21; the patient arrives with the area on the tip of his right great  toe completely healed. He has the same proximal on that I see every time. I treated him empirically with Augmentin for osteomyelitis. He needs to be vigilant about this. READMISSION 03/19/2022 The patient returns with a reopening of the right great toe wound. Apparently this occurred in around October of last year. He was followed by podiatry for some time. They have performed x-rays but unfortunately, we do not have access to them or the results. No report as to whether or not he might have osteomyelitis. In addition, he has a new wound on the medial aspect of the right great toe as well as the anterior tibial surface of his right lower leg. He does state that he was wearing compression stockings when this tibial  wound occurred. He continues to wear crocs. 04/02/2022: For some reason, the patient has not been seen in 2 Hanson. He says that he had to reschedule his visit. Therefore his Coflex compression wrap was left in situ for 14 days. It appears that his skin became macerated and he has opened up several new superficial wounds on his right anterior tibial surface. We did get an x-ray to evaluate for osteomyelitis and none was seen. All of the pre-existing wounds are smaller today and the new wounds look like they are limited to just the breakdown of skin. 04/10/2022: The wounds on his right anterior tibial surface are actually a little bit larger today. He seems to have some skin irritation and maceration in the area again. The tip of his toe is nearly closed as is the wound on the dorsal surface of his right great toe. READMISSION 07/10/2022 Since our last visit, the patient went to the operating room to have a left hip replacement, but in the preop holding area, his surgeon noted a foul odor coming from his right foot. He contacted me and described the findings of his right great toe and we mutually agreed, along with the patient, that amputation would be most appropriate. 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 Since his surgery, he has been wearing compression stockings bilaterally, but as he is unable to apply or remove them himself, he wears them for a week at a time. This appears to have resulted in a pressure injury on his right anterior ankle at the point of flexion; he has a similar looking abrasion that has not broken the skin in the same site on the left. 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 07/21/2022: The right anterior ankle wound is nearly closed aside from a small open area that has some slough accumulation. The amputation site has crusting and eschar present. Once this was removed, it was revealed  that the amputation site has healed. The abrasion on the left anterior ankle at the point of flexion is no longer present. Patient History Information obtained from Patient. Family History Diabetes - Maternal Grandparents, Heart Disease - Father, Hypertension - Father, Stroke - Father, Thyroid Problems - Mother,Child, No family history of Cancer, Hereditary Spherocytosis, Kidney Disease, Lung Disease, Seizures, Tuberculosis. Social History Never smoker, Marital Status - Single, Alcohol Use - Rarely, Drug Use - No History, Caffeine Use - Daily - coffee. Medical History Eyes Patient has history of Cataracts - 2019 Ear/Nose/Mouth/Throat Patient has history of Chronic sinus problems/congestion Respiratory Patient has history of Asthma, Sleep Apnea Cardiovascular Patient has history of Hypertension, Peripheral Venous Disease Endocrine Denies history of Type II Diabetes  Musculoskeletal Patient has history of Osteoarthritis Medical A Surgical History Notes nd Gastrointestinal GERD Genitourinary BPH Musculoskeletal Degenerative disc disease Neurologic Neuromuscular disorder Objective Constitutional Slightly hypertensive. No acute distress.. Vitals Time Taken: 3:13 PM, Height: 68 in, Weight: 245 lbs, BMI: 37.2, Temperature: 98.3 F, Pulse: 84 bpm, Respiratory Rate: 20 breaths/min, Blood Pressure: 145/79 mmHg. Respiratory Normal work of breathing on room air.. General Notes: 07/21/2022: The right anterior ankle wound is nearly closed aside from a small open area that has some slough accumulation. The amputation site has crusting and eschar present. Once this was removed, it was revealed that the amputation site has healed. The abrasion on the left anterior ankle at the point of flexion is no longer present. Integumentary (Hair, Skin) Wound #7 status is Open. Original cause of wound was Footwear Injury. The date acquired was: 07/03/2022. The wound has been in treatment 1 Hanson.  The wound is located on the Right,Anterior Ankle. The wound measures 0.5cm length x 0.8cm width x 0.1cm depth; 0.314cm^2 area and 0.031cm^3 volume. There is Fat Layer (Subcutaneous Tissue) exposed. There is no tunneling or undermining noted. There is a medium amount of serosanguineous drainage noted. The wound margin is flat and intact. There is small (1-33%) red, pink granulation within the wound bed. There is a large (67-100%) amount of necrotic tissue within the wound bed including Adherent Slough. Wound #8 status is Healed - Epithelialized. Original cause of wound was Skin Tear/Laceration. The date acquired was: 07/03/2022. The wound has been in treatment 1 Hanson. The wound is located on the Right Amputation Site - T The wound measures 0cm length x 0cm width x 0cm depth; 0cm^2 area and 0cm^3 oe. volume. There is no tunneling or undermining noted. There is a none present amount of drainage noted. There is no granulation within the wound bed. There is no necrotic tissue within the wound bed. Assessment Active Problems ICD-10 Pressure ulcer of right ankle, stage 2 Non-pressure chronic ulcer of other part of right foot with fat layer exposed Disruption of external operation (surgical) wound, not elsewhere classified, sequela Chronic kidney disease, stage 3 unspecified Venous insufficiency (chronic) (peripheral) Procedures Wound #7 Pre-procedure diagnosis of Wound #7 is a Venous Leg Ulcer located on the Right,Anterior Ankle .Severity of Tissue Pre Debridement is: Fat layer exposed. There was a Excisional Skin/Subcutaneous Tissue Debridement with a total area of 0.4 sq cm performed by Fredirick Maudlin, MD. With the following instrument(s): Curette to remove Viable and Non-Viable tissue/material. Material removed includes Subcutaneous Tissue and Slough and after achieving pain control using Lidocaine 4% T opical Solution. No specimens were taken. A time out was conducted at 15:35, prior to the  start of the procedure. A Minimum amount of bleeding was controlled with Pressure. The procedure was tolerated well with a pain level of 0 throughout and a pain level of 0 following the procedure. Post Debridement Measurements: 0.5cm length x 0.8cm width x 0.1cm depth; 0.031cm^3 volume. Character of Wound/Ulcer Post Debridement is improved. Severity of Tissue Post Debridement is: Fat layer exposed. Post procedure Diagnosis Wound #7: Same as Pre-Procedure General Notes: scribed by Baruch Gouty, RN for Dr. Celine Ahr. Wound #8 Pre-procedure diagnosis of Wound #8 is an Open Surgical Wound located on the Right Amputation Site - T . There was a Selective/Open Wound Non-Viable oe Tissue Debridement with a total area of 0.5 sq cm performed by Fredirick Maudlin, MD. With the following instrument(s): Curette to remove Non-Viable tissue/material. Material removed includes Eschar after achieving pain control using Lidocaine  4% Topical Solution. No specimens were taken. A time out was conducted at 15:35, prior to the start of the procedure. A Minimum amount of bleeding was controlled with Pressure. The procedure was tolerated well with a pain level of 0 throughout and a pain level of 0 following the procedure. Post Debridement Measurements: 0cm length x 0cm width x 0cm depth; 0cm^3 volume. Character of Wound/Ulcer Post Debridement is improved. Post procedure Diagnosis Wound #8: Same as Pre-Procedure General Notes: scribed by Baruch Gouty, RN for Dr. Celine Ahr. Plan Follow-up Appointments: Return Appointment in 1 week. - Dr. Celine Ahr- Rm 4 Anesthetic: Wound #7 Right,Anterior Ankle: (In clinic) Topical Lidocaine 4% applied to wound bed Bathing/ Shower/ Hygiene: May shower and wash wound with soap and water. Edema Control - Lymphedema / SCD / Other: Elevate legs to the level of the heart or above for 30 minutes daily and/or when sitting, a frequency of: - throughout the day Avoid standing for long periods of  time. Exercise regularly Moisturize legs daily. - both legs nightly Compression stocking or Garment 20-30 mm/Hg pressure to: - both legs daily WOUND #7: - Ankle Wound Laterality: Right, Anterior Cleanser: Soap and Water 1 x Per Week/ Discharge Instructions: May shower and wash wound with dial antibacterial soap and water prior to dressing change. Prim Dressing: KerraCel Ag Gelling Fiber Dressing, 2x2 in (silver alginate) 1 x Per Week/ ary Discharge Instructions: Apply silver alginate to wound bed as instructed Secondary Dressing: ALLEVYN Gentle Border, 4x4 (in/in) 1 x Per Week/ Discharge Instructions: Apply over primary dressing as directed. 07/21/2022: The right anterior ankle wound is nearly closed aside from a small open area that has some slough accumulation. The amputation site has crusting and eschar present. Once this was removed, it was revealed that the amputation site has healed. The abrasion on the left anterior ankle at the point of flexion is no longer present. I used a curette to debride slough and nonviable subcutaneous tissue from the right anterior ankle wound. I debrided the eschar off of the toe amputation site which revealed that it was closed. Patient requested that he just wear his compression stockings rather than any sort of wrap. We will be sure to apply foam to the flexion point of his ankles to prevent further bunching and rubbing of his compression stockings. Continue silver alginate to the open wound. Follow-up in 1 week. Electronic Signature(s) Signed: 07/21/2022 4:36:06 PM By: Fredirick Maudlin MD FACS Signed: 07/21/2022 4:51:35 PM By: Baruch Gouty RN, BSN Previous Signature: 07/21/2022 4:10:51 PM Version By: Fredirick Maudlin MD FACS Entered By: Baruch Gouty on 07/21/2022 16:21:53 -------------------------------------------------------------------------------- HxROS Details Patient Name: [name]: [name] [name] 07/21/2022 2:45 PM Medical Record  Number: [id number] Patient Account Number: [id number] Date of Birth/Sex: Treating RN: 1941/03/02 (81 y.o. M) Primary Care Provider: [name] Other Clinician: Referring Provider: Treating Provider/Extender: [name] in Treatment: 1 Information Obtained From Patient Eyes Medical History: Positive for: Cataracts - 2019 Ear/Nose/Mouth/Throat Medical History: Positive for: Chronic sinus problems/congestion Respiratory Medical History: Positive for: Asthma; Sleep Apnea Cardiovascular Medical History: Positive for: Hypertension; Peripheral Venous Disease Gastrointestinal Medical History: Past Medical History Notes: GERD Endocrine Medical History: Negative for: Type II Diabetes Genitourinary Medical History: Past Medical History Notes: BPH Musculoskeletal Medical History: Positive for: Osteoarthritis Past Medical History Notes: Degenerative disc disease Neurologic Medical History: Past Medical History Notes: Neuromuscular disorder HBO Extended History Items Ear/Nose/Mouth/Throat: Eyes: Chronic sinus Cataracts problems/congestion Immunizations Pneumococcal Vaccine: Received Pneumococcal Vaccination: Yes Received  Pneumococcal Vaccination On or After 60th Birthday: No Implantable Devices None Family and Social History Cancer: No; Diabetes: Yes - Maternal Grandparents; Heart Disease: Yes - Father; Hereditary Spherocytosis: No; Hypertension: Yes - Father; Kidney Disease: No; Lung Disease: No; Seizures: No; Stroke: Yes - Father; Thyroid Problems: Yes - Mother,Child; Tuberculosis: No; Never smoker; Marital Status - Single; Alcohol Use: Rarely; Drug Use: No History; Caffeine Use: Daily - coffee; Financial Concerns: No; Food, Clothing or Shelter Needs: No; Support System Lacking: No; Transportation Concerns: No Electronic Signature(s) Signed: 07/21/2022 4:36:06 PM By: Fredirick Maudlin MD FACS Entered By: Fredirick Maudlin on 07/21/2022  16:08:55 -------------------------------------------------------------------------------- SuperBill Details Patient Name: [name]: [name] [name] 07/21/2022 Medical Record Number: [id number] Patient Account Number: [id number] Date of Birth/Sex: Treating RN: February 23, 1941 (81 y.o. M) Primary Care Provider: [name] Other Clinician: Referring Provider: Treating Provider/Extender: [name] in Treatment: 1 Diagnosis Coding ICD-10 Codes Code Description [phone] Pressure ulcer of right ankle, stage 2 L97.512 Non-pressure chronic ulcer of other part of right foot with fat layer exposed T81.31XS Disruption of external operation (surgical) wound, not elsewhere classified, sequela N18.30 Chronic kidney disease, stage 3 unspecified I87.2 Venous insufficiency (chronic) (peripheral) Facility Procedures CPT4 Code: [id number] Description: 11042 - DEB SUBQ TISSUE 20 SQ CM/< ICD-10 Diagnosis Description L89.512 Pressure ulcer of right ankle, stage 2 Modifier: Quantity: 1 CPT4 Code: [id number] Description: 82060 - DEBRIDE WOUND 1ST 20 SQ CM OR < ICD-10 Diagnosis Description L97.512 Non-pressure chronic ulcer of other part of right foot with fat layer exposed Modifier: Quantity: 1 Physician Procedures : CPT4 Code Description Modifier [id number] - WC PHYS LEVEL 3 - EST PT 25 ICD-10 Diagnosis Description L89.512 Pressure ulcer of right ankle, stage 2 L97.512 Non-pressure chronic ulcer of other part of right foot with fat layer exposed T81.31XS  Disruption of external operation (surgical) wound, not elsewhere classified, sequela Quantity: 1 : [id number] 95747 - WC PHYS SUBQ TISS 20 SQ CM ICD-10 Diagnosis Description L89.512 Pressure ulcer of right ankle, stage 2 Quantity: 1 : [id number] 64383 - WC PHYS DEBR WO ANESTH 20 SQ CM 1 ICD-10 Diagnosis Description L97.512 Non-pressure chronic ulcer of other part of right foot with fat layer  exposed Quantity: Electronic Signature(s) Signed: 07/21/2022 4:11:15 PM By: Fredirick Maudlin MD FACS Entered By: Fredirick Maudlin on 07/21/2022 16:11:15

## 2022-07-22 ENCOUNTER — Ambulatory Visit: Payer: Self-pay

## 2022-07-22 ENCOUNTER — Ambulatory Visit (INDEPENDENT_AMBULATORY_CARE_PROVIDER_SITE_OTHER): Payer: Medicare Other | Admitting: Physical Medicine and Rehabilitation

## 2022-07-22 VITALS — BP 158/84 | HR 96

## 2022-07-22 DIAGNOSIS — M5416 Radiculopathy, lumbar region: Secondary | ICD-10-CM | POA: Diagnosis not present

## 2022-07-22 MED ORDER — METHYLPREDNISOLONE ACETATE 80 MG/ML IJ SUSP
40.0000 mg | Freq: Once | INTRAMUSCULAR | Status: AC
  Administered 2022-07-22: 40 mg

## 2022-07-22 NOTE — Progress Notes (Signed)
Low back pain.  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

## 2022-07-28 ENCOUNTER — Encounter (HOSPITAL_BASED_OUTPATIENT_CLINIC_OR_DEPARTMENT_OTHER): Payer: Medicare Other | Admitting: General Surgery

## 2022-07-28 DIAGNOSIS — L97812 Non-pressure chronic ulcer of other part of right lower leg with fat layer exposed: Secondary | ICD-10-CM | POA: Diagnosis not present

## 2022-07-28 DIAGNOSIS — I129 Hypertensive chronic kidney disease with stage 1 through stage 4 chronic kidney disease, or unspecified chronic kidney disease: Secondary | ICD-10-CM | POA: Diagnosis not present

## 2022-07-28 DIAGNOSIS — T8781 Dehiscence of amputation stump: Secondary | ICD-10-CM | POA: Diagnosis not present

## 2022-07-28 DIAGNOSIS — I872 Venous insufficiency (chronic) (peripheral): Secondary | ICD-10-CM | POA: Diagnosis not present

## 2022-07-28 DIAGNOSIS — S81801A Unspecified open wound, right lower leg, initial encounter: Secondary | ICD-10-CM | POA: Diagnosis not present

## 2022-07-28 DIAGNOSIS — L97312 Non-pressure chronic ulcer of right ankle with fat layer exposed: Secondary | ICD-10-CM | POA: Diagnosis not present

## 2022-07-28 DIAGNOSIS — L89512 Pressure ulcer of right ankle, stage 2: Secondary | ICD-10-CM | POA: Diagnosis not present

## 2022-07-28 DIAGNOSIS — N183 Chronic kidney disease, stage 3 unspecified: Secondary | ICD-10-CM | POA: Diagnosis not present

## 2022-07-28 NOTE — Progress Notes (Signed)
LAKE, CINQUEMANI ([id number]) Visit Report for 07/28/2022 Arrival Information Details Patient Name: [name]: BOHDI, LEEDS [name] 07/28/2022 1:45 PM Medical Record Number: [id number] Patient Account Number: [id number] Date of Birth/Sex: Treating RN: 08-19-1941 (81 y.o. Waldron Session Primary Care Nuri Larmer: [name] Other Clinician: Referring Brionna Romanek: Treating Jakalyn Kratky/Extender: Benedict Needy Weeks in Treatment: 2 Visit Information History Since Last Visit All ordered tests and consults were completed: Yes Patient Arrived: [name] Added or deleted any medications: No Arrival Time: 13:58 Any new allergies or adverse reactions: No Accompanied By: self Had a fall or experienced change in No Transfer Assistance: None activities of daily living that may affect Patient Requires Transmission-Based Precautions: No risk of falls: Patient Has Alerts: Yes Signs or symptoms of abuse/neglect since last visito No Patient Alerts: ABI LLE 1.16, RLE 1.14 Hospitalized since last visit: No Implantable device outside of the clinic excluding No cellular tissue based products placed in the center since last visit: Has Dressing in Place as Prescribed: Yes Has Compression in Place as Prescribed: Yes Pain Present Now: Yes Electronic Signature(s) Signed: 07/28/2022 5:27:19 PM By: Blanche East RN Entered By: Blanche East on 07/28/2022 13:58:36 -------------------------------------------------------------------------------- Encounter Discharge Information Details [name] 07/28/2022 1:45 PM Medical Record Number: [id number] Patient Account Number: [id number] Date of Birth/Sex: Treating RN: [birth year] (81 y.o. Waldron Session Primary Care Daylan Juhnke: [name] Other Clinician: Referring Zanylah Hardie: Treating Marlee Armenteros/Extender: Benedict Needy Weeks in Treatment: 2 Encounter Discharge Information Items Post  Procedure Vitals Discharge Condition: Stable Temperature (F): 97.8 Ambulatory Status: Cane Pulse (bpm): 94 Discharge Destination: Home Respiratory Rate (breaths/min): 20 Transportation: Private Auto Blood Pressure (mmHg): 157/78 Accompanied By: self Schedule Follow-up Appointment: Yes Clinical Summary of Care: Electronic Signature(s) Signed: 07/28/2022 5:27:19 PM By: Blanche East RN Entered By: Blanche East on 07/28/2022 14:35:26 -------------------------------------------------------------------------------- Lower Extremity Assessment Details [name] 07/28/2022 1:45 PM Medical Record Number: [id number] Patient Account Number: [id number] Date of Birth/Sex: Treating RN: [birth year] (81 y.o. Waldron Session Primary Care Adda Stokes: [name] Other Clinician: Referring Chari Parmenter: Treating Stephens Shreve/Extender: Benedict Needy Weeks in Treatment: 2 Edema Assessment Assessed: [Left: No] [Right: No] E[Left: dema] [Right: :] Calf Left: Right: Point of Measurement: From Medial Instep 38 cm 40 cm Ankle Left: Right: Point of Measurement: From Medial Instep 26 cm 28 cm Vascular Assessment Pulses: Dorsalis Pedis Palpable: [Left:Yes] [Right:Yes] Electronic Signature(s) Signed: 07/28/2022 5:27:19 PM By: Blanche East RN Entered By: Blanche East on 07/28/2022 14:02:06 -------------------------------------------------------------------------------- Multi Wound Chart Details [name] 07/28/2022 1:45 PM Medical Record Number: [id number] Patient Account Number: [id number] Date of Birth/Sex: Treating RN: [birth year] (81 y.o. Waldron Session Primary Care Rebeka Kimble: [name] Other Clinician: Referring Teyanna Thielman: Treating Jaquitta Dupriest/Extender: Benedict Needy Weeks in Treatment: 2 Vital Signs Height(in): 23 Pulse(bpm): 11 Weight(lbs): 245 Blood  Pressure(mmHg): 157/78 Body Mass Index(BMI): 37.2 Temperature(F): 97.8 Respiratory Rate(breaths/min): 20 Photos: [7:Right, Anterior Ankle] [9:Right, Anterior Lower Leg] [N/A:N/A N/A] Wound Location: [7:Footwear Injury] [9:Trauma] [N/A:N/A] Wounding Event: [7:Venous Leg Ulcer] [9:Trauma, Other] [N/A:N/A] Primary Etiology: [7:Cataracts, Chronic sinus] [9:Cataracts, Chronic sinus] [N/A:N/A] Comorbid History: [7:problems/congestion, Asthma, Sleep problems/congestion, Asthma, Sleep Apnea, Hypertension, Peripheral Venous Disease, Osteoarthritis 07/03/2022] [9:Apnea, Hypertension, Peripheral Venous Disease, Osteoarthritis 07/24/2022] [N/A:N/A] Date Acquired: [7:2] [9:0] [N/A:N/A] Weeks of Treatment: [7:Open] [9:Open] [N/A:N/A] Wound Status: [7:No] [9:No] [N/A:N/A] Wound Recurrence: [7:0.5x1x0.1] [9:3x1x0.1] [N/A:N/A] Measurements L x  W x D (cm) [7:0.393] [9:2.356] [N/A:N/A] A (cm) : rea [7:0.039] [9:0.236] [N/A:N/A] Volume (cm) : [7:72.20%] [9:0.00%] [N/A:N/A] % Reduction in A [7:rea: 72.30%] [9:0.00%] [N/A:N/A] % Reduction in Volume: [7:Full Thickness Without Exposed] [9:Full Thickness Without Exposed] [N/A:N/A] Classification: [7:Support Structures Medium] [9:Support Structures N/A] [N/A:N/A] Exudate A mount: [7:Serosanguineous] [9:N/A] [N/A:N/A] Exudate Type: [7:red, brown] [9:N/A] [N/A:N/A] Exudate Color: [7:Flat and Intact] [9:Flat and Intact] [N/A:N/A] Wound Margin: [7:Small (1-33%)] [9:Large (67-100%)] [N/A:N/A] Granulation A mount: [7:Red, Pink] [9:Pink] [N/A:N/A] Granulation Quality: [7:Large (67-100%)] [9:Small (1-33%)] [N/A:N/A] Necrotic A mount: [7:Fat Layer (Subcutaneous Tissue): Yes Fat Layer (Subcutaneous Tissue): Yes N/A] Exposed Structures: [7:Fascia: No Tendon: No Muscle: No Joint: No Bone: No Small (1-33%)] [9:Fascia: No Tendon: No Muscle: No Joint: No Bone: No Medium (34-66%)] [N/A:N/A] Epithelialization: [7:Debridement - Selective/Open Wound Debridement - Selective/Open  Wound N/A] Debridement: Pre-procedure Verification/Time Out 14:20 [9:14:20] [N/A:N/A] Taken: [7:Lidocaine 5% topical ointment] [9:Lidocaine 5% topical ointment] [N/A:N/A] Pain Control: [7:Necrotic/Eschar, Slough] [9:Necrotic/Eschar, Slough] [N/A:N/A] Tissue Debrided: [7:Non-Viable Tissue] [9:Skin/Epidermis] [N/A:N/A] Level: [7:0.5] [9:3] [N/A:N/A] Debridement A (sq cm): [7:rea Curette] [9:Curette] [N/A:N/A] Instrument: [7:Minimum] [9:Minimum] [N/A:N/A] Bleeding: [7:Pressure] [9:Pressure] [N/A:N/A] Hemostasis A chieved: [7:N/A] [9:0] [N/A:N/A] Procedural Pain: [7:N/A] [9:0] [N/A:N/A] Post Procedural Pain: [7:Procedure was tolerated well] [9:Procedure was tolerated well] [N/A:N/A] Debridement Treatment Response: [7:0.5x1x0.1] [9:3x1x0.1] [N/A:N/A] Post Debridement Measurements L x W x D (cm) [7:0.039] [9:0.236] [N/A:N/A] Post Debridement Volume: (cm) [7:Debridement] [9:Debridement] [N/A:N/A] Treatment Notes Wound #7 (Ankle) Wound Laterality: Right, Anterior Cleanser Soap and Water Discharge Instruction: May shower and wash wound with dial antibacterial soap and water prior to dressing change. Peri-Wound Care Topical Primary Dressing KerraCel Ag Gelling Fiber Dressing, 2x2 in (silver alginate) Discharge Instruction: Apply silver alginate to wound bed as instructed Secondary Dressing ALLEVYN Gentle Border, 4x4 (in/in) Discharge Instruction: Apply over primary dressing as directed. Secured With Compression Wrap Compression Stockings Add-Ons Wound #9 (Lower Leg) Wound Laterality: Right, Anterior Cleanser Soap and Water Discharge Instruction: May shower and wash wound with dial antibacterial soap and water prior to dressing change. Peri-Wound Care Topical Primary Dressing KerraCel Ag Gelling Fiber Dressing, 2x2 in (silver alginate) Discharge Instruction: Apply silver alginate to wound bed as instructed Secondary Dressing ALLEVYN Gentle Border, 4x4 (in/in) Discharge Instruction:  Apply over primary dressing as directed. Secured With Compression Wrap Compression Stockings Environmental education officer) Signed: 07/28/2022 2:37:25 PM By: Fredirick Maudlin MD FACS Signed: 07/28/2022 5:27:19 PM By: Blanche East RN Entered By: Fredirick Maudlin on 07/28/2022 14:37:25 -------------------------------------------------------------------------------- Multi-Disciplinary Care Plan Details [name] 07/28/2022 1:45 PM Medical Record Number: [id number] Patient Account Number: [id number] Date of Birth/Sex: Treating RN: 09-30-1941 (81 y.o. Waldron Session Primary Care Lizbet Cirrincione: [name] Other Clinician: Referring Valia Wingard: Treating Reiner Loewen/Extender: Benedict Needy Weeks in Treatment: 2 Active Inactive Venous Leg Ulcer Nursing Diagnoses: Potential for venous Insuffiency (use before diagnosis confirmed) Goals: Patient will maintain optimal edema control Date Initiated: 07/10/2022 Target Resolution Date: 08/07/2022 Goal Status: Active Interventions: Assess peripheral edema status every visit. Treatment Activities: Therapeutic compression applied : 07/10/2022 Notes: Wound/Skin Impairment Nursing Diagnoses: Impaired tissue integrity Goals: Ulcer/skin breakdown will have a volume reduction of 30% by week 4 Date Initiated: 07/10/2022 Target Resolution Date: 08/07/2022 Goal Status: Active Interventions: Provide education on ulcer and skin care Treatment Activities: Skin care regimen initiated : 07/10/2022 Topical wound management initiated : 07/10/2022 Notes: Electronic Signature(s) Signed: 07/28/2022 5:27:19 PM By: Blanche East RN Entered By: Blanche East on 07/28/2022 14:15:17 -------------------------------------------------------------------------------- Pain Assessment Details Patient Name: Date  of Service: TRASK, VOSLER [name] 07/28/2022 1:45 PM Medical Record Number: [id number] Patient Account  Number: [id number] Date of Birth/Sex: Treating RN: [birth year] (81 y.o. Waldron Session Primary Care Sadiyah Kangas: [name] Other Clinician: Referring Kannen Moxey: Treating Albertha Beattie/Extender: Benedict Needy Weeks in Treatment: 2 Active Problems Location of Pain Severity and Description of Pain Patient Has Paino Yes Site Locations Pain Location: Generalized Pain Rate the pain. Current Pain Level: 5 Character of Pain Describe the Pain: Aching Pain Management and Medication Current Pain Management: Electronic Signature(s) Signed: 07/28/2022 5:27:19 PM By: Blanche East RN Entered By: Blanche East on 07/28/2022 13:59:10 -------------------------------------------------------------------------------- Patient/Caregiver Education Details [name] 9/18/2023andnbsp1:45 PM Medical Record Number: [id number] Patient Account Number: [id number] Date of Birth/Gender: Treating RN: August 04, 1941 (81 y.o. Waldron Session Primary Care Physician: [name] Other Clinician: Referring Physician: Treating Physician/Extender: Benedict Needy Weeks in Treatment: 2 Education Assessment Education Provided To: Patient Education Topics Provided Wound/Skin Impairment: Methods: Explain/Verbal Responses: Reinforcements needed, State content correctly Electronic Signature(s) Signed: 07/28/2022 5:27:19 PM By: Blanche East RN Entered By: Blanche East on 07/28/2022 14:15:31 -------------------------------------------------------------------------------- Wound Assessment Details [name] 07/28/2022 1:45 PM Medical Record Number: [id number] Patient Account Number: [id number] Date of Birth/Sex: Treating RN: 1941/03/16 (81 y.o. Waldron Session Primary Care Carmyn Hamm: [name] Other Clinician: Referring Gawain Crombie: Treating Merry Pond/Extender: Benedict Needy Weeks in Treatment: 2 Wound Status Wound Number: 7 Primary Venous Leg Ulcer Etiology: Wound Location: Right, Anterior Ankle Wound Open Wounding Event: Footwear Injury Status: Date Acquired: 07/03/2022 Comorbid Cataracts, Chronic sinus problems/congestion, Asthma, Sleep Weeks Of Treatment: 2 History: Apnea, Hypertension, Peripheral Venous Disease, Osteoarthritis Clustered Wound: No Photos Wound Measurements Length: (cm) 0.5 Width: (cm) 1 Depth: (cm) 0.1 Area: (cm) 0.393 Volume: (cm) 0.039 % Reduction in Area: 72.2% % Reduction in Volume: 72.3% Epithelialization: Small (1-33%) Tunneling: No Undermining: No Wound Description Classification: Full Thickness Without Exposed Support Structures Wound Margin: Flat and Intact Exudate Amount: Medium Exudate Type: Serosanguineous Exudate Color: red, brown Foul Odor After Cleansing: No Slough/Fibrino Yes Wound Bed Granulation Amount: Small (1-33%) Exposed Structure Granulation Quality: Red, Pink Fascia Exposed: No Necrotic Amount: Large (67-100%) Fat Layer (Subcutaneous Tissue) Exposed: Yes Necrotic Quality: Adherent Slough Tendon Exposed: No Muscle Exposed: No Joint Exposed: No Bone Exposed: No Treatment Notes Wound #7 (Ankle) Wound Laterality: Right, Anterior Cleanser Soap and Water Discharge Instruction: May shower and wash wound with dial antibacterial soap and water prior to dressing change. Peri-Wound Care Topical Primary Dressing KerraCel Ag Gelling Fiber Dressing, 2x2 in (silver alginate) Discharge Instruction: Apply silver alginate to wound bed as instructed Secondary Dressing ALLEVYN Gentle Border, 4x4 (in/in) Discharge Instruction: Apply over primary dressing as directed. Secured With Compression Wrap Compression Stockings Environmental education officer) Signed: 07/28/2022 5:27:19 PM By: Blanche East RN Entered By: Blanche East on 07/28/2022  14:14:23 -------------------------------------------------------------------------------- Wound Assessment Details [name] 07/28/2022 1:45 PM Medical Record Number: [id number] Patient Account Number: [id number] Date of Birth/Sex: Treating RN: [birth year] (81 y.o. Waldron Session Primary Care Tamula Morrical: [name] Other Clinician: Referring Ioma Chismar: Treating Saron Vanorman/Extender: Benedict Needy Weeks in Treatment: 2 Wound Status Wound Number: 9 Primary Trauma, Other Etiology: Wound Location: Right, Anterior Lower Leg Wound Open Wounding Event: Trauma Status: Date Acquired: 07/24/2022 Comorbid Cataracts, Chronic sinus problems/congestion, Asthma, Sleep Weeks Of Treatment: 0 History: Apnea, Hypertension, Peripheral Venous Disease,  Osteoarthritis Clustered Wound: No Photos Wound Measurements Length: (cm) 3 Width: (cm) 1 Depth: (cm) 0.1 Area: (cm) 2.356 Volume: (cm) 0.236 % Reduction in Area: 0% % Reduction in Volume: 0% Epithelialization: Medium (34-66%) Tunneling: No Undermining: No Wound Description Classification: Full Thickness Without Exposed Support Structures Wound Margin: Flat and Intact Foul Odor After Cleansing: No Slough/Fibrino Yes Wound Bed Granulation Amount: Large (67-100%) Exposed Structure Granulation Quality: Pink Fascia Exposed: No Necrotic Amount: Small (1-33%) Fat Layer (Subcutaneous Tissue) Exposed: Yes Necrotic Quality: Adherent Slough Tendon Exposed: No Muscle Exposed: No Joint Exposed: No Bone Exposed: No Treatment Notes Wound #9 (Lower Leg) Wound Laterality: Right, Anterior Cleanser Soap and Water Discharge Instruction: May shower and wash wound with dial antibacterial soap and water prior to dressing change. Peri-Wound Care Topical Primary Dressing KerraCel Ag Gelling Fiber Dressing, 2x2 in (silver alginate) Discharge Instruction: Apply silver alginate to wound bed as  instructed Secondary Dressing ALLEVYN Gentle Border, 4x4 (in/in) Discharge Instruction: Apply over primary dressing as directed. Secured With Compression Wrap Compression Stockings Environmental education officer) Signed: 07/28/2022 5:27:19 PM By: Blanche East RN Entered By: Blanche East on 07/28/2022 14:14:52 -------------------------------------------------------------------------------- Vitals Details [name] 07/28/2022 1:45 PM Medical Record Number: [id number] Patient Account Number: [id number] Date of Birth/Sex: Treating RN: 1940/12/31 (81 y.o. Waldron Session Primary Care Domnic Vantol: [name] Other Clinician: Referring Cicero Noy: Treating Lela Murfin/Extender: Benedict Needy Weeks in Treatment: 2 Vital Signs Time Taken: 13:58 Temperature (F): 97.8 Height (in): 68 Pulse (bpm): 94 Weight (lbs): 245 Respiratory Rate (breaths/min): 20 Body Mass Index (BMI): 37.2 Blood Pressure (mmHg): 157/78 Reference Range: 80 - 120 mg / dl Electronic Signature(s) Signed: 07/28/2022 5:27:19 PM By: Blanche East RN Entered By: Blanche East on 07/28/2022 13:58:59

## 2022-07-28 NOTE — Progress Notes (Signed)
[name], BERNS ([id number]) Visit Report for 07/28/2022 Chief Complaint Document Details Patient Name: [name]: [name], [name] 07/28/2022 1:45 PM Medical Record Number: [id number] Patient Account Number: [id number] Date of Birth/Sex: Treating RN: 16-May-1941 (81 y.o. Waldron Session Primary Care Provider: [name] Other Clinician: Referring Provider: Treating Provider/Extender: Benedict Needy Weeks in Treatment: 2 Information Obtained from: Patient Chief Complaint 02/07/2021; patient is here for review of a wound on the tip of his right great toe 03/19/2022: re-opening of the right great toe wound, with additional wounds on medial aspect of right great toe and right anterior tibial surface 07/10/2022: Partial dehiscence of right great toe amputation site, pressure ulcer of anterior right ankle Electronic Signature(s) Signed: 07/28/2022 2:37:34 PM By: Fredirick Maudlin MD FACS Entered By: Fredirick Maudlin on 07/28/2022 14:37:34 -------------------------------------------------------------------------------- Debridement Details Patient Name: [name]: [name] [name] 07/28/2022 1:45 PM Medical Record Number: [id number] Patient Account Number: [id number] Date of Birth/Sex: Treating RN: 04/21/1941 (81 y.o. Waldron Session Primary Care Provider: [name] Other Clinician: Referring Provider: Treating Provider/Extender: Benedict Needy Weeks in Treatment: 2 Debridement Performed for Assessment: Wound #7 Right,Anterior Ankle Performed By: Physician Fredirick Maudlin, MD Debridement Type: Debridement Severity of Tissue Pre Debridement: Fat layer exposed Level of Consciousness (Pre-procedure): Awake and Alert Pre-procedure Verification/Time Out Yes - 14:20 Taken: Start Time: 14:21 Pain Control: Lidocaine 5% topical ointment T Area Debrided (L x W): otal 0.5 (cm) x 1 (cm) = 0.5 (cm) Tissue and other material debrided:  Non-Viable, Eschar, Slough, Slough Level: Non-Viable Tissue Debridement Description: Selective/Open Wound Instrument: Curette Bleeding: Minimum Hemostasis Achieved: Pressure Response to Treatment: Procedure was tolerated well Level of Consciousness (Post- Awake and Alert procedure): Post Debridement Measurements of Total Wound Length: (cm) 0.5 Width: (cm) 1 Depth: (cm) 0.1 Volume: (cm) 0.039 Character of Wound/Ulcer Post Debridement: Requires Further Debridement Severity of Tissue Post Debridement: Fat layer exposed Post Procedure Diagnosis Same as Pre-procedure Notes Scribed for Dr. Celine Ahr by Blanche East, RN Electronic Signature(s) Signed: 07/28/2022 2:54:32 PM By: Fredirick Maudlin MD FACS Signed: 07/28/2022 5:27:19 PM By: Blanche East RN Entered By: Blanche East on 07/28/2022 14:22:23 -------------------------------------------------------------------------------- Debridement Details Patient Name: [name]: [name] [name] 07/28/2022 1:45 PM Medical Record Number: [id number] Patient Account Number: [id number] Date of Birth/Sex: Treating RN: 02/05/1941 (81 y.o. Waldron Session Primary Care Provider: [name] Other Clinician: Referring Provider: Treating Provider/Extender: Benedict Needy Weeks in Treatment: 2 Debridement Performed for Assessment: Wound #9 Right,Anterior Lower Leg Performed By: Physician Fredirick Maudlin, MD Debridement Type: Debridement Level of Consciousness (Pre-procedure): Awake and Alert Pre-procedure Verification/Time Out Yes - 14:20 Taken: Start Time: 14:21 Pain Control: Lidocaine 5% topical ointment T Area Debrided (L x W): otal 3 (cm) x 1 (cm) = 3 (cm) Tissue and other material debrided: Non-Viable, Eschar, Slough, Skin: Epidermis, Slough Level: Skin/Epidermis Debridement Description: Selective/Open Wound Instrument: Curette Bleeding: Minimum Hemostasis Achieved: Pressure Procedural Pain: 0 Post  Procedural Pain: 0 Response to Treatment: Procedure was tolerated well Level of Consciousness (Post- Awake and Alert procedure): Post Debridement Measurements of Total Wound Length: (cm) 3 Width: (cm) 1 Depth: (cm) 0.1 Volume: (cm) 0.236 Character of Wound/Ulcer Post Debridement: Requires Further Debridement Post Procedure Diagnosis Same as Pre-procedure Notes Scribed for Dr. Celine Ahr by Blanche East, RN Electronic Signature(s) Signed: 07/28/2022 2:54:32 PM By: Fredirick Maudlin MD FACS Signed: 07/28/2022 5:27:19 PM By: Blanche East RN Entered By: Blanche East on 07/28/2022 14:23:09 --------------------------------------------------------------------------------  HPI Details Patient Name: [name]: [name] [name] 07/28/2022 1:45 PM Medical Record Number: [id number] Patient Account Number: [id number] Date of Birth/Sex: Treating RN: 1941-04-20 (81 y.o. Waldron Session Primary Care Provider: [name] Other Clinician: Referring Provider: Treating Provider/Extender: Benedict Needy Weeks in Treatment: 2 History of Present Illness HPI Description: ADMISSION 02/07/2021 This is a 81 year old man who is not a diabetic "prediabetic" who comes in today letter asking [place] to look at an area on his right first toe tip that has been there about a year. He has been getting this dressed that [employer] and he had Xeroform on this with gauze. The patient is not exactly sure how this came about but he is not able to dress the wound himself because of limitations due to what sounds like spinal stenosis and pain. He had a wound on the ankle as well but that healed over. He was seen by vascular in May 2021. This was related to a right great toe wound. He had an angiogram that was completely normal on both sides good perfusion right into the feet. He also looks like he has lymphedema and he has compression stockings but he cannot get them on and off so he essentially leaves  he is on even when he showering Past medical history includes prediabetes, low back pain secondary I think the lumbar spinal stenosis has been offered a decompressive laminectomy but he has not gone forth with this. 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 ABI was noncompressible in our clinic on the right right first toe. 4/7; patient I admitted the clinic last week. He has an area on the tip of his right first toe. We cleaned this off last week and have been using silver collagen. He is in the same crocs today that he wore last week and I told him not to use. For 1 reason or another he could not handle a surgical sandal. 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 He is not a diabetic 4/12; right first toe wound chronic. We have been using silver collagen. Once again he comes in then the same pair of old crocs that he comes in every week. 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 His x-ray suggested suspicious for osteomyelitis with bone irregularity at the tuft of the right great toe 4/21; patient has been using silver collagen every other day to the wound on his right great toe. He continues to wear crocs. He has no complaints or issues today. 5/5; 2-week follow-up. 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 Mild soft tissue edema with enhancement at the distal aspect of the great toe suggestive of cellulitis. Fortunately the wound actually looks better. Has been using Hydrofera Blue 5/12; started him on Augmentin last week 875 twice daily he is tolerating this well. Last creatinine I see is 1.77 in early April. We are using Hydrofera Blue to the wound which actually looks better today. 5/17; tolerating the Augmentin well. He says the wound is actually been hurting him episodically. Actually the surface  of this looks quite good. He did not get his lab work done. We are using [facility] he is changing this himself 6/2; still taking Augmentin he should be rounding into his last week.  This was empirically for underlying osteomyelitis. The area on the tip of his toe is still open still with some depth but no palpable bone. We have been using Hydrofera Blue. Surface area of the wound has not been making much improvement Is work was essentially unremarkable except for a creatinine of 1.79. Reason for his renal insufficiency is not clear he is not a diabetic does not have hypertension however he does take NSAIDs and Lasix I wonder if this is the issue here. Note that his creatinine on 11/12/2018 was 1.45. Platelet count slightly low at 143 however his inflammatory markers were really in the normal range at 10 and 2 sedimentation rate and C-reactive protein respectively. 6/9; he apparently had another 2 weeks of Augmentin after we look that this last time. Therefore he should be coming into his last week next week. We are using polymen on the tip of the toe. He cannot get down to do the dressing so he comes in for a nurse visit. He comes in in crocs I have warned him against this although he says he is using a different shoe for most of the day he works in [employer]. 6/16; he comes back in in crocs. I have spoken him about this before. His areas on the tip of his toe this actually looks quite good we have been using polymen. He has completed his antibiotics which we empirically gave him for underlying osteomyelitis. Everything looks better to me in spite of his noncompliance with footwear recommendation 7/11; patient has been followed through nurse visits. He was last seen by provider on 6/16. He reports improvement to the wound with the use of PolyMem. He denies signs of infection. 7/21; the patient arrives with the area on the tip of his right great toe completely healed. He has the same proximal on  that I see every time. I treated him empirically with Augmentin for osteomyelitis. He needs to be vigilant about this. READMISSION 03/19/2022 The patient returns with a reopening of the right great toe wound. Apparently this occurred in around October of last year. He was followed by podiatry for some time. They have performed x-rays but unfortunately, we do not have access to them or the results. No report as to whether or not he might have osteomyelitis. In addition, he has a new wound on the medial aspect of the right great toe as well as the anterior tibial surface of his right lower leg. He does state that he was wearing compression stockings when this tibial wound occurred. He continues to wear crocs. 04/02/2022: For some reason, the patient has not been seen in 2 weeks. He says that he had to reschedule his visit. Therefore his Coflex compression wrap was left in situ for 14 days. It appears that his skin became macerated and he has opened up several new superficial wounds on his right anterior tibial surface. We did get an x-ray to evaluate for osteomyelitis and none was seen. All of the pre-existing wounds are smaller today and the new wounds look like they are limited to just the breakdown of skin. 04/10/2022: The wounds on his right anterior tibial surface are actually a little bit larger today. He seems to have some skin irritation and maceration in the area again. The tip of his toe is nearly closed as is the wound on the dorsal surface of his right great toe. READMISSION 07/10/2022 Since our last visit, the patient went to the operating room to have a left  hip replacement, but in the preop holding area, his surgeon noted a foul odor coming from his right foot. He contacted me and described the findings of his right great toe and we mutually agreed, along with the patient, that amputation would be most appropriate. 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 Since his surgery, he has been wearing compression stockings bilaterally, but as he is unable to apply or remove them himself, he wears them for a week at a time. This appears to have resulted in a pressure injury on his right anterior ankle at the point of flexion; he has a similar looking abrasion that has not broken the skin in the same site on the left. 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 07/21/2022: The right anterior ankle wound is nearly closed aside from a small open area that has some slough accumulation. The amputation site has crusting and eschar present. Once this was removed, it was revealed that the amputation site has healed. The abrasion on the left anterior ankle at the point of flexion is no longer present. 07/28/2022: The patient dropped a door on his leg and has a new wound to his right anterior tibial surface. There is some nonviable skin hanging from the cranial portion of the wound. The fat layer is exposed. The right anterior ankle wound is a little bit smaller and has some accumulated slough present. Electronic Signature(s) Signed: 07/28/2022 2:39:06 PM By: Fredirick Maudlin MD FACS Previous Signature: 07/28/2022 2:38:23 PM Version By: Fredirick Maudlin MD FACS Entered By: Fredirick Maudlin on 07/28/2022 14:39:06 -------------------------------------------------------------------------------- Physical Exam Details Patient Name: [name]: [name] [name] 07/28/2022 1:45 PM Medical Record Number: [id number] Patient Account Number: [id number] Date of Birth/Sex: Treating RN: October 20, 1941 (81 y.o. Waldron Session Primary Care Provider: [name] Other Clinician: Referring Provider: Treating Provider/Extender: Benedict Needy Weeks in Treatment: 2 Constitutional Hypertensive, asymptomatic. . . . No acute distress.[name] Respiratory Normal work of breathing on room  air.. Notes 07/28/2022: The patient dropped a door on his leg and has a new wound to his right anterior tibial surface. There is some nonviable skin hanging from the cranial portion of the wound. The fat layer is exposed. The right anterior ankle wound is a little bit smaller and has some accumulated slough present. Electronic Signature(s) Signed: 07/28/2022 2:39:37 PM By: Fredirick Maudlin MD FACS Entered By: Fredirick Maudlin on 07/28/2022 14:39:37 -------------------------------------------------------------------------------- Physician Orders Details Patient Name: [name]: [name] [name] 07/28/2022 1:45 PM Medical Record Number: [id number] Patient Account Number: [id number] Date of Birth/Sex: Treating RN: 1941/01/24 (81 y.o. 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 - Dr. Celine Ahr- Rm 4 Return A Monday 08/04/2022 at 2:30 PM Anesthetic Wound #7 Right,Anterior Ankle (In clinic) Topical Lidocaine 4% applied to wound bed - in clinic, prior to debridement Bathing/ Shower/ Hygiene May shower and wash wound with soap and water. Edema Control - Lymphedema / SCD / Other Left Lower Extremity Elevate legs to the level  of the heart or above for 30 minutes daily and/or when sitting, a frequency of: - throughout the day Avoid standing for long periods of time. Exercise regularly Moisturize legs daily. - both legs nightly Compression stocking or Garment 20-30 mm/Hg pressure to: - both legs  daily Wound Treatment Wound #7 - Ankle Wound Laterality: Right, Anterior Cleanser: Soap and Water 1 x Per Week Discharge Instructions: May shower and wash wound with dial antibacterial soap and water prior to dressing change. Prim Dressing: KerraCel Ag Gelling Fiber Dressing, 2x2 in (silver alginate) 1 x Per Week ary Discharge Instructions: Apply silver alginate to wound bed as instructed Secondary Dressing: ALLEVYN Gentle Border, 4x4 (in/in) 1 x Per Week Discharge Instructions: Apply over primary dressing as directed. Wound #9 - Lower Leg Wound Laterality: Right, Anterior Cleanser: Soap and Water 1 x Per Week Discharge Instructions: May shower and wash wound with dial antibacterial soap and water prior to dressing change. Prim Dressing: KerraCel Ag Gelling Fiber Dressing, 2x2 in (silver alginate) 1 x Per Week ary Discharge Instructions: Apply silver alginate to wound bed as instructed Secondary Dressing: ALLEVYN Gentle Border, 4x4 (in/in) 1 x Per Week Discharge Instructions: Apply over primary dressing as directed. Patient Medications llergies: No Known Allergies A Notifications Medication Indication Start End prior to debridement 07/28/2022 lidocaine DOSE topical 5 % ointment - ointment topical Electronic Signature(s) Signed: 07/28/2022 2:54:32 PM By: Fredirick Maudlin MD FACS Entered By: Fredirick Maudlin on 07/28/2022 14:40:48 Prescription 07/28/2022 -------------------------------------------------------------------------------- Oswaldo Conroy MD Patient Name: Provider: 09-Sep-1941 [id number] Date of Birth: NPI#: Jerilynn Mages [id number] Sex: DEA #: [id number] [phone] Phone #: License #: [place] Patient Address: [address], [place] [address] [place], [place] [address] [phone] Allergies No Known Allergies Medication Medication: Route: Strength: Form: lidocaine 5 %  topical ointment topical 5% ointment Class: TOPICAL LOCAL ANESTHETICS Dose: Frequency / Time: Indication: ointment topical prior to debridement Number of Refills: Number of Units: 0 Generic Substitution: Start Date: End Date: One Time Use: Substitution Permitted 07/11/5175 No Note to Pharmacy: Hand Signature: Date(s): Electronic Signature(s) Signed: 07/28/2022 2:41:47 PM By: Fredirick Maudlin MD FACS Entered By: Fredirick Maudlin on 07/28/2022 14:41:47 -------------------------------------------------------------------------------- Problem List Details Patient Name: [name]: [name] [name] 07/28/2022 1:45 PM Medical Record Number: [id number] Patient Account Number: [id number] Date of Birth/Sex: Treating RN: Sep 26, 1941 (81 y.o. Waldron Session Primary Care Provider: [name] Other Clinician: Referring Provider: Treating Provider/Extender: Benedict Needy Weeks in Treatment: 2 Active Problems ICD-10 Encounter Code Description Active Date MDM Diagnosis N18.30 Chronic kidney disease, stage 3 unspecified 07/10/2022 No Yes I87.2 Venous insufficiency (chronic) (peripheral) 07/10/2022 No Yes L97.812 Non-pressure chronic ulcer of other part of right lower leg with fat layer 07/28/2022 No Yes exposed L89.512 Pressure ulcer of right ankle, stage 2 07/10/2022 No Yes Inactive Problems ICD-10 Code Description Active Date Inactive Date T81.31XS Disruption of external operation (surgical) wound, not elsewhere classified, sequela 07/10/2022 07/10/2022 L97.512 Non-pressure chronic ulcer of other part of right foot with fat layer exposed 07/10/2022 07/10/2022 Resolved Problems Electronic Signature(s) Signed: 07/28/2022 2:38:51 PM By: Fredirick Maudlin MD FACS Previous Signature: 07/28/2022 2:37:16 PM Version By: Fredirick Maudlin MD FACS Entered By: Fredirick Maudlin on 07/28/2022  14:38:51 -------------------------------------------------------------------------------- Progress Note Details Patient Name: [name]: [name] [name] 07/28/2022 1:45 PM Medical Record Number: [id number] Patient Account Number: [id number] Date of Birth/Sex: Treating RN: 03-Nov-1941 (81 y.o. M) Zochol,  Roselyn Reef Primary Care Provider: [name] Other Clinician: Referring Provider: Treating Provider/Extender: Benedict Needy Weeks in Treatment: 2 Subjective Chief Complaint Information obtained from Patient 02/07/2021; patient is here for review of a wound on the tip of his right great toe 03/19/2022: re-opening of the right great toe wound, with additional wounds on medial aspect of right great toe and right anterior tibial surface 07/10/2022: Partial dehiscence of right great toe amputation site, pressure ulcer of anterior right ankle History of Present Illness (HPI) ADMISSION 02/07/2021 This is a 81 year old man who is not a diabetic "prediabetic" who comes in today letter asking [place] to look at an area on his right first toe tip that has been there about a year. He has been getting this dressed that [employer] and he had Xeroform on this with gauze. The patient is not exactly sure how this came about but he is not able to dress the wound himself because of limitations due to what sounds like spinal stenosis and pain. He had a wound on the ankle as well but that healed over. He was seen by vascular in May 2021. This was related to a right great toe wound. He had an angiogram that was completely normal on both sides good perfusion right into the feet. He also looks like he has lymphedema and he has compression stockings but he cannot get them on and off so he essentially leaves he is on even when he showering Past medical history includes prediabetes, low back pain secondary I think the lumbar spinal stenosis has been offered a decompressive laminectomy but he has not  gone forth with this. 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 ABI was noncompressible in our clinic on the right right first toe. 4/7; patient I admitted the clinic last week. He has an area on the tip of his right first toe. We cleaned this off last week and have been using silver collagen. He is in the same crocs today that he wore last week and I told him not to use. For 1 reason or another he could not handle a surgical sandal. 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 He is not a diabetic 4/12; right first toe wound chronic. We have been using silver collagen. Once again he comes in then the same pair of old crocs that he comes in every week. 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 His x-ray suggested suspicious for osteomyelitis with bone irregularity at the tuft of the right great toe 4/21; patient has been using silver collagen every other day to the wound on his right great toe. He continues to wear crocs. He has no complaints or issues today. 5/5; 2-week follow-up. 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 Mild soft tissue edema with enhancement at the distal aspect of the great toe suggestive of cellulitis. Fortunately the wound actually looks better. Has been using Hydrofera Blue 5/12; started him on Augmentin last week 875 twice daily he is tolerating this well. Last creatinine I see is 1.77 in early April. We are using Hydrofera Blue to the wound which actually looks better today. 5/17; tolerating the Augmentin well. He says the wound is actually been hurting him episodically. Actually the surface of this looks quite good. He did  not get his lab work done. We are using [facility] he is changing this himself 6/2; still taking Augmentin he should be rounding into his last week. This  was empirically for underlying osteomyelitis. The area on the tip of his toe is still open still with some depth but no palpable bone. We have been using Hydrofera Blue. Surface area of the wound has not been making much improvement Is work was essentially unremarkable except for a creatinine of 1.79. Reason for his renal insufficiency is not clear he is not a diabetic does not have hypertension however he does take NSAIDs and Lasix I wonder if this is the issue here. Note that his creatinine on 11/12/2018 was 1.45. Platelet count slightly low at 143 however his inflammatory markers were really in the normal range at 10 and 2 sedimentation rate and C-reactive protein respectively. 6/9; he apparently had another 2 weeks of Augmentin after we look that this last time. Therefore he should be coming into his last week next week. We are using polymen on the tip of the toe. He cannot get down to do the dressing so he comes in for a nurse visit. He comes in in crocs I have warned him against this although he says he is using a different shoe for most of the day he works in [employer]. 6/16; he comes back in in crocs. I have spoken him about this before. His areas on the tip of his toe this actually looks quite good we have been using polymen. He has completed his antibiotics which we empirically gave him for underlying osteomyelitis. Everything looks better to me in spite of his noncompliance with footwear recommendation 7/11; patient has been followed through nurse visits. He was last seen by provider on 6/16. He reports improvement to the wound with the use of PolyMem. He denies signs of infection. 7/21; the patient arrives with the area on the tip of his right great toe completely healed. He has the same proximal on that I see every time. I treated him empirically with Augmentin for osteomyelitis. He needs to be vigilant about this. READMISSION 03/19/2022 The patient returns with a reopening of the  right great toe wound. Apparently this occurred in around October of last year. He was followed by podiatry for some time. They have performed x-rays but unfortunately, we do not have access to them or the results. No report as to whether or not he might have osteomyelitis. In addition, he has a new wound on the medial aspect of the right great toe as well as the anterior tibial surface of his right lower leg. He does state that he was wearing compression stockings when this tibial wound occurred. He continues to wear crocs. 04/02/2022: For some reason, the patient has not been seen in 2 weeks. He says that he had to reschedule his visit. Therefore his Coflex compression wrap was left in situ for 14 days. It appears that his skin became macerated and he has opened up several new superficial wounds on his right anterior tibial surface. We did get an x-ray to evaluate for osteomyelitis and none was seen. All of the pre-existing wounds are smaller today and the new wounds look like they are limited to just the breakdown of skin. 04/10/2022: The wounds on his right anterior tibial surface are actually a little bit larger today. He seems to have some skin irritation and maceration in the area again. The tip of his toe is nearly closed as  is the wound on the dorsal surface of his right great toe. READMISSION 07/10/2022 Since our last visit, the patient went to the operating room to have a left hip replacement, but in the preop holding area, his surgeon noted a foul odor coming from his right foot. He contacted me and described the findings of his right great toe and we mutually agreed, along with the patient, that amputation would be most appropriate. 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 Since his surgery, he has been wearing compression stockings bilaterally, but as he is unable to apply or remove them himself, he wears them for a week at a time.  This appears to have resulted in a pressure injury on his right anterior ankle at the point of flexion; he has a similar looking abrasion that has not broken the skin in the same site on the left. 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 07/21/2022: The right anterior ankle wound is nearly closed aside from a small open area that has some slough accumulation. The amputation site has crusting and eschar present. Once this was removed, it was revealed that the amputation site has healed. The abrasion on the left anterior ankle at the point of flexion is no longer present. 07/28/2022: The patient dropped a door on his leg and has a new wound to his right anterior tibial surface. There is some nonviable skin hanging from the cranial portion of the wound. The fat layer is exposed. The right anterior ankle wound is a little bit smaller and has some accumulated slough present. Patient History Information obtained from Patient. Family History Diabetes - Maternal Grandparents, Heart Disease - Father, Hypertension - Father, Stroke - Father, Thyroid Problems - Mother,Child, No family history of Cancer, Hereditary Spherocytosis, Kidney Disease, Lung Disease, Seizures, Tuberculosis. Social History Never smoker, Marital Status - Single, Alcohol Use - Rarely, Drug Use - No History, Caffeine Use - Daily - coffee. Medical History Eyes Patient has history of Cataracts - 2019 Ear/Nose/Mouth/Throat Patient has history of Chronic sinus problems/congestion Respiratory Patient has history of Asthma, Sleep Apnea Cardiovascular Patient has history of Hypertension, Peripheral Venous Disease Endocrine Denies history of Type II Diabetes Musculoskeletal Patient has history of Osteoarthritis Medical A Surgical History Notes nd Gastrointestinal GERD Genitourinary BPH Musculoskeletal Degenerative disc disease Neurologic Neuromuscular  disorder Objective Constitutional Hypertensive, asymptomatic. No acute distress.. Vitals Time Taken: 1:58 PM, Height: 68 in, Weight: 245 lbs, BMI: 37.2, Temperature: 97.8 F, Pulse: 94 bpm, Respiratory Rate: 20 breaths/min, Blood Pressure: 157/78 mmHg. Respiratory Normal work of breathing on room air.. General Notes: 07/28/2022: The patient dropped a door on his leg and has a new wound to his right anterior tibial surface. There is some nonviable skin hanging from the cranial portion of the wound. The fat layer is exposed. The right anterior ankle wound is a little bit smaller and has some accumulated slough present. Integumentary (Hair, Skin) Wound #7 status is Open. Original cause of wound was Footwear Injury. The date acquired was: 07/03/2022. The wound has been in treatment 2 weeks. The wound is located on the Right,Anterior Ankle. The wound measures 0.5cm length x 1cm width x 0.1cm depth; 0.393cm^2 area and 0.039cm^3 volume. There is Fat Layer (Subcutaneous Tissue) exposed. There is no tunneling or undermining noted. There is a medium amount of serosanguineous drainage noted. The wound margin is flat and intact. There is small (1-33%) red, pink granulation  within the wound bed. There is a large (67-100%) amount of necrotic tissue within the wound bed including Adherent Slough. Wound #9 status is Open. Original cause of wound was Trauma. The date acquired was: 07/24/2022. The wound is located on the Right,Anterior Lower Leg. The wound measures 3cm length x 1cm width x 0.1cm depth; 2.356cm^2 area and 0.236cm^3 volume. There is Fat Layer (Subcutaneous Tissue) exposed. There is no tunneling or undermining noted. The wound margin is flat and intact. There is large (67-100%) pink granulation within the wound bed. There is a small (1-33%) amount of necrotic tissue within the wound bed including Adherent Slough. 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 There was a Selective/Open Wound Non-Viable Tissue Debridement with a total area of 0.5 sq cm performed by Fredirick Maudlin, MD. With the following instrument(s): Curette to remove Non-Viable tissue/material. Material removed includes Eschar and Slough and after achieving pain control using Lidocaine 5% topical ointment. No specimens were taken. A time out was conducted at 14:20, prior to the start of the procedure. A Minimum amount of bleeding was controlled with Pressure. The procedure was tolerated well. Post Debridement Measurements: 0.5cm length x 1cm width x 0.1cm depth; 0.039cm^3 volume. Character of Wound/Ulcer Post Debridement requires further debridement. Severity of Tissue Post Debridement is: Fat layer exposed. Post procedure Diagnosis Wound #7: Same as Pre-Procedure General Notes: Scribed for Dr. Celine Ahr by Blanche East, RN. Wound #9 Pre-procedure diagnosis of Wound #9 is a Trauma, Other located on the Right,Anterior Lower Leg . There was a Selective/Open Wound Skin/Epidermis Debridement with a total area of 3 sq cm performed by Fredirick Maudlin, MD. With the following instrument(s): Curette to remove Non-Viable tissue/material. Material removed includes Eschar, Dalton, and Skin: Epidermis after achieving pain control using Lidocaine 5% topical ointment. No specimens were taken. A time out was conducted at 14:20, prior to the start of the procedure. A Minimum amount of bleeding was controlled with Pressure. The procedure was tolerated well with a pain level of 0 throughout and a pain level of 0 following the procedure. Post Debridement Measurements: 3cm length  x 1cm width x 0.1cm depth; 0.236cm^3 volume. Character of Wound/Ulcer Post Debridement requires further debridement. Post procedure Diagnosis Wound #9: Same as Pre-Procedure General Notes: Scribed for Dr. Celine Ahr by Blanche East, RN. Plan Follow-up Appointments: Return Appointment in 1 week. - Dr. Celine Ahr- Rm 4 Monday 08/04/2022 at 2:30 PM Anesthetic: Wound #7 Right,Anterior Ankle: (In clinic) Topical Lidocaine 4% applied to wound bed - in clinic, prior to debridement Bathing/ Shower/ Hygiene: May shower and wash wound with soap and water. Edema Control - Lymphedema / SCD / Other: Elevate legs to the level of the heart or above for 30 minutes daily and/or when sitting, a frequency of: - throughout the day Avoid standing for long periods of time. Exercise regularly Moisturize legs daily. - both legs nightly Compression stocking or Garment 20-30 mm/Hg pressure to: - both legs daily The following medication(s) was prescribed: lidocaine topical 5 % ointment ointment topical for prior to debridement was prescribed at facility WOUND #7: - Ankle Wound Laterality: Right, Anterior Cleanser: Soap and Water 1 x Per Week/ Discharge Instructions: May shower and  wash wound with dial antibacterial soap and water prior to dressing change. Prim Dressing: KerraCel Ag Gelling Fiber Dressing, 2x2 in (silver alginate) 1 x Per Week/ ary Discharge Instructions: Apply silver alginate to wound bed as instructed Secondary Dressing: ALLEVYN Gentle Border, 4x4 (in/in) 1 x Per Week/ Discharge Instructions: Apply over primary dressing as directed. WOUND #9: - Lower Leg Wound Laterality: Right, Anterior Cleanser: Soap and Water 1 x Per Week/ Discharge Instructions: May shower and wash wound with dial antibacterial soap and water prior to dressing change. Prim Dressing: KerraCel Ag Gelling Fiber Dressing, 2x2 in (silver alginate) 1 x Per Week/ ary Discharge Instructions: Apply silver alginate to wound bed as  instructed Secondary Dressing: ALLEVYN Gentle Border, 4x4 (in/in) 1 x Per Week/ Discharge Instructions: Apply over primary dressing as directed. 07/28/2022: The patient dropped a door on his leg and has a new wound to his right anterior tibial surface. There is some nonviable skin hanging from the cranial portion of the wound. The fat layer is exposed. The right anterior ankle wound is a little bit smaller and has some accumulated slough present. A curette to debride the nonviable skin as well as some slough from the new wound. I debrided slough and eschar from the right anterior ankle wound. We will continue silver alginate with a foam border dressing and the patient's own compression stockings. Follow-up in 1 week. Electronic Signature(s) Signed: 07/28/2022 2:41:24 PM By: Fredirick Maudlin MD FACS Entered By: Fredirick Maudlin on 07/28/2022 14:41:24 -------------------------------------------------------------------------------- HxROS Details Patient Name: [name]: [name] [name] 07/28/2022 1:45 PM Medical Record Number: [id number] Patient Account Number: [id number] Date of Birth/Sex: Treating RN: [birth year] (81 y.o. Waldron Session Primary Care Provider: [name] Other Clinician: Referring Provider: Treating Provider/Extender: Benedict Needy Weeks in Treatment: 2 Information Obtained From Patient Eyes Medical History: Positive for: Cataracts - 2019 Ear/Nose/Mouth/Throat Medical History: Positive for: Chronic sinus problems/congestion Respiratory Medical History: Positive for: Asthma; Sleep Apnea Cardiovascular Medical History: Positive for: Hypertension; Peripheral Venous Disease Gastrointestinal Medical History: Past Medical History Notes: GERD Endocrine Medical History: Negative for: Type II Diabetes Genitourinary Medical History: Past Medical History Notes: BPH Musculoskeletal Medical History: Positive for:  Osteoarthritis Past Medical History Notes: Degenerative disc disease Neurologic Medical History: Past Medical History Notes: Neuromuscular disorder HBO Extended History Items Ear/Nose/Mouth/Throat: Eyes: Chronic sinus Cataracts problems/congestion Immunizations Pneumococcal Vaccine: Received Pneumococcal Vaccination: Yes Received Pneumococcal Vaccination On or After 60th Birthday: No Implantable Devices None Family and Social History Cancer: No; Diabetes: Yes - Maternal Grandparents; Heart Disease: Yes - Father; Hereditary Spherocytosis: No; Hypertension: Yes - Father; Kidney Disease: No; Lung Disease: No; Seizures: No; Stroke: Yes - Father; Thyroid Problems: Yes - Mother,Child; Tuberculosis: No; Never smoker; Marital Status - Single; Alcohol Use: Rarely; Drug Use: No History; Caffeine Use: Daily - coffee; Financial Concerns: No; Food, Clothing or Shelter Needs: No; Support System Lacking: No; Transportation Concerns: No Electronic Signature(s) Signed: 07/28/2022 2:54:32 PM By: Fredirick Maudlin MD FACS Signed: 07/28/2022 5:27:19 PM By: Blanche East RN Entered By: Fredirick Maudlin on 07/28/2022 14:39:10 -------------------------------------------------------------------------------- SuperBill Details Patient Name: [name]: [name] [name] 07/28/2022 Medical Record Number: [id number] Patient Account Number: [id number] Date of Birth/Sex: Treating RN: 1941-10-26 (81 y.o. Waldron Session Primary Care Provider: [name] Other Clinician: Referring Provider: Treating Provider/Extender: Benedict Needy Weeks in Treatment: 2 Diagnosis Coding ICD-10 Codes Code Description [phone] Pressure ulcer of right ankle, stage 2 N18.30 Chronic kidney disease, stage 3 unspecified  I87.2 Venous insufficiency (chronic) (peripheral) L97.812 Non-pressure chronic ulcer of other part of right lower leg with fat layer exposed Facility Procedures CPT4 Code:  [id number] Description: [id number] - DEBRIDE WOUND 1ST 20 SQ CM OR < ICD-10 Diagnosis Description L89.512 Pressure ulcer of right ankle, stage 2 L97.812 Non-pressure chronic ulcer of other part of right lower leg with fat layer expos Modifier: ed Quantity: 1 Physician Procedures : CPT4 Code Description Modifier [id number] - WC PHYS LEVEL 4 - EST PT 25 ICD-10 Diagnosis Description L89.512 Pressure ulcer of right ankle, stage 2 L97.812 Non-pressure chronic ulcer of other part of right lower leg with fat layer exposed I87.2  Venous insufficiency (chronic) (peripheral) N18.30 Chronic kidney disease, stage 3 unspecified Quantity: 1 : [id number] - WC PHYS DEBR WO ANESTH 20 SQ CM ICD-10 Diagnosis Description L89.512 Pressure ulcer of right ankle, stage 2 L97.812 Non-pressure chronic ulcer of other part of right lower leg with fat layer exposed Quantity: 1 Electronic Signature(s) Signed: 07/28/2022 2:41:43 PM By: Fredirick Maudlin MD FACS Entered By: Fredirick Maudlin on 07/28/2022 14:41:42

## 2022-08-04 ENCOUNTER — Encounter (HOSPITAL_BASED_OUTPATIENT_CLINIC_OR_DEPARTMENT_OTHER): Payer: Medicare Other | Admitting: General Surgery

## 2022-08-04 DIAGNOSIS — L97812 Non-pressure chronic ulcer of other part of right lower leg with fat layer exposed: Secondary | ICD-10-CM | POA: Diagnosis not present

## 2022-08-04 DIAGNOSIS — I129 Hypertensive chronic kidney disease with stage 1 through stage 4 chronic kidney disease, or unspecified chronic kidney disease: Secondary | ICD-10-CM | POA: Diagnosis not present

## 2022-08-04 DIAGNOSIS — I872 Venous insufficiency (chronic) (peripheral): Secondary | ICD-10-CM | POA: Diagnosis not present

## 2022-08-04 DIAGNOSIS — N183 Chronic kidney disease, stage 3 unspecified: Secondary | ICD-10-CM | POA: Diagnosis not present

## 2022-08-04 DIAGNOSIS — S81801A Unspecified open wound, right lower leg, initial encounter: Secondary | ICD-10-CM | POA: Diagnosis not present

## 2022-08-04 DIAGNOSIS — L89512 Pressure ulcer of right ankle, stage 2: Secondary | ICD-10-CM | POA: Diagnosis not present

## 2022-08-04 DIAGNOSIS — T8781 Dehiscence of amputation stump: Secondary | ICD-10-CM | POA: Diagnosis not present

## 2022-08-04 DIAGNOSIS — L97312 Non-pressure chronic ulcer of right ankle with fat layer exposed: Secondary | ICD-10-CM | POA: Diagnosis not present

## 2022-08-05 NOTE — Progress Notes (Signed)
[name] - 81 y.o. male MRN [id number]  Date of birth: 1941-10-06  Office Visit Note: Visit Date: 07/22/2022 PCP: Cari Caraway, MD Referred by: Cari Caraway, MD  Subjective: Chief Complaint  Patient presents with   Lower Back - Pain   HPI:  [name] is a 81 y.o. male who comes in today at the request of Barnet Pall, FNP for planned Right L5-S1 Lumbar Interlaminar epidural steroid injection with fluoroscopic guidance.  The patient has failed conservative care including home exercise, medications, time and activity modification.  This injection will be diagnostic and hopefully therapeutic.  Please see requesting physician notes for further details and justification.   ROS Otherwise per HPI.  Assessment & Plan: Visit Diagnoses:    ICD-10-CM   1. Lumbar radiculopathy  M54.16 XR C-ARM NO REPORT    Epidural Steroid injection    methylPREDNISolone acetate (DEPO-MEDROL) injection 40 mg      Plan: No additional findings.   Meds & Orders:  Meds ordered this encounter  Medications   methylPREDNISolone acetate (DEPO-MEDROL) injection 40 mg    Orders Placed This Encounter  Procedures   XR C-ARM NO REPORT   Epidural Steroid injection    Follow-up: Return if symptoms worsen or fail to improve.   Procedures: No procedures performed  Lumbar Epidural Steroid Injection - Interlaminar Approach with Fluoroscopic Guidance  Patient: [name]      Date of Birth: [birth year] MRN: [id number] PCP: Cari Caraway, MD      Visit Date: 07/22/2022   Universal Protocol:     Consent Given By: the patient  Position: PRONE  Additional Comments: Vital signs were monitored before and after the procedure. Patient was prepped and draped in the usual sterile fashion. The correct patient, procedure, and site was verified.   Injection Procedure Details:   Procedure diagnoses: Lumbar radiculopathy [M54.16]   Meds Administered:  Meds ordered this encounter   Medications   methylPREDNISolone acetate (DEPO-MEDROL) injection 40 mg     Laterality: Right  Location/Site:  L5-S1  Needle: 3.5 in., 20 ga. Tuohy  Needle Placement: Paramedian epidural  Findings:   -Comments: Excellent flow of contrast into the epidural space.  Procedure Details: Using a paramedian approach from the side mentioned above, the region overlying the inferior lamina was localized under fluoroscopic visualization and the soft tissues overlying this structure were infiltrated with 4 ml. of 1% Lidocaine without Epinephrine. The Tuohy needle was inserted into the epidural space using a paramedian approach.   The epidural space was localized using loss of resistance along with counter oblique bi-planar fluoroscopic views.  After negative aspirate for air, blood, and CSF, a 2 ml. volume of Isovue-250 was injected into the epidural space and the flow of contrast was observed. Radiographs were obtained for documentation purposes.    The injectate was administered into the level noted above.   Additional Comments:  No complications occurred Dressing: 2 x 2 sterile gauze and Band-Aid    Post-procedure details: Patient was observed during the procedure. Post-procedure instructions were reviewed.  Patient left the clinic in stable condition.   Clinical History: CT LUMBAR MYELOGRAM FINDINGS:     T10-11: No stenosis. Facet degeneration and ligamentous  calcification. Solid bridging anterior osteophytes.     T11-12: No stenosis.     T12-L1: Mild bulging of the disc. No compressive stenosis. Conus tip  at this level.     L1-2: 2 mm retrolisthesis. Moderate multifactorial spinal stenosis.  Disc space narrowing. Endplate  osteophytes. Facet and ligamentous  hypertrophy. Foraminal stenosis on both sides that could compress  the L1 nerves.     L2-3: 2 mm retrolisthesis. Chronic disc degeneration with loss of  disc height and vacuum phenomenon. Endplate osteophytes. Facet  and  ligamentous hypertrophy. Severe multifactorial spinal stenosis  likely to cause neural compression. Bilateral foraminal stenosis  that could compress the L2 nerves.     L3-4: 2 mm retrolisthesis. Endplate osteophytes and bulging of the  disc. Facet and ligamentous hypertrophy. Severe multifactorial  spinal stenosis likely to cause neural compression. Bilateral  foraminal stenosis that could affect the exiting L3 nerves.     L4-5: Chronic facet arthropathy with anterolisthesis of 1 cm. Disc  degeneration with disc space narrowing and vacuum phenomenon.  Endplate osteophytes and bulging of the disc. Severe stenosis of the  central canal and both neural foramina likely to cause neural  compression.     L5-S1: Chronic disc degeneration with loss of disc height and vacuum  phenomenon. Endplate osteophytes. Facet hypertrophy and degeneration  with some ligamentous calcification. Severe stenosis of the  subarticular lateral recesses and neural foramina.     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 Curvature convex to the left in the thoracolumbar  junction region and towards the right in the lower lumbar region. 2  mm retrolisthesis L1-2, L2-3 and L3-4. 1 cm anterolisthesis L4-5.     Severe multifactorial spinal stenosis and neural foraminal stenosis  likely to cause neural compression at L2-3, L3-4, L4-5 and L5-S1.  Moderate multifactorial canal stenosis at L1-2. Bilateral foraminal  stenosis at that level.        Electronically Signed    By: Nelson Chimes M.D.    On: 12/06/2019 10:12     Objective:  VS:  HT:    WT:   BMI:     BP:(!) 158/84  HR:96bpm  TEMP: ( )  RESP:  Physical Exam Vitals and nursing note reviewed.  Constitutional:      General: He is not in acute distress.    Appearance: Normal appearance. He is not ill-appearing.  HENT:     Head: Normocephalic and atraumatic.     Right Ear: External ear normal.      Left Ear: External ear normal.     Nose: No congestion.  Eyes:     Extraocular Movements: Extraocular movements intact.  Cardiovascular:     Rate and Rhythm: Normal rate.     Pulses: Normal pulses.  Pulmonary:     Effort: Pulmonary effort is normal. No respiratory distress.  Abdominal:     General: There is no distension.     Palpations: Abdomen is soft.  Musculoskeletal:        General: No tenderness or signs of injury.     Cervical back: Neck supple.     Right lower leg: No edema.     Left lower leg: No edema.     Comments: Patient has left distal strength without clonus. Right foot drop.  Skin:    Findings: No erythema or rash.  Neurological:     General: No focal deficit present.     Mental Status: He is alert and oriented to person, place, and time.     Sensory: No sensory deficit.     Motor: Weakness present. No abnormal muscle tone.     Coordination: Coordination normal.     Gait: Gait abnormal.  Psychiatric:        Mood  and Affect: Mood normal.        Behavior: Behavior normal.      Imaging: No results found.

## 2022-08-05 NOTE — Procedures (Signed)
Lumbar Epidural Steroid Injection - Interlaminar Approach with Fluoroscopic Guidance  Patient: [name]      Date of Birth: 1940/12/09 MRN: [id number] PCP: Cari Caraway, MD      Visit Date: 07/22/2022   Universal Protocol:     Consent Given By: the patient  Position: PRONE  Additional Comments: Vital signs were monitored before and after the procedure. Patient was prepped and draped in the usual sterile fashion. The correct patient, procedure, and site was verified.   Injection Procedure Details:   Procedure diagnoses: Lumbar radiculopathy [M54.16]   Meds Administered:  Meds ordered this encounter  Medications   methylPREDNISolone acetate (DEPO-MEDROL) injection 40 mg     Laterality: Right  Location/Site:  L5-S1  Needle: 3.5 in., 20 ga. Tuohy  Needle Placement: Paramedian epidural  Findings:   -Comments: Excellent flow of contrast into the epidural space.  Procedure Details: Using a paramedian approach from the side mentioned above, the region overlying the inferior lamina was localized under fluoroscopic visualization and the soft tissues overlying this structure were infiltrated with 4 ml. of 1% Lidocaine without Epinephrine. The Tuohy needle was inserted into the epidural space using a paramedian approach.   The epidural space was localized using loss of resistance along with counter oblique bi-planar fluoroscopic views.  After negative aspirate for air, blood, and CSF, a 2 ml. volume of Isovue-250 was injected into the epidural space and the flow of contrast was observed. Radiographs were obtained for documentation purposes.    The injectate was administered into the level noted above.   Additional Comments:  No complications occurred Dressing: 2 x 2 sterile gauze and Band-Aid    Post-procedure details: Patient was observed during the procedure. Post-procedure instructions were reviewed.  Patient left the clinic in stable condition.

## 2022-08-07 DIAGNOSIS — L218 Other seborrheic dermatitis: Secondary | ICD-10-CM | POA: Diagnosis not present

## 2022-08-07 DIAGNOSIS — D225 Melanocytic nevi of trunk: Secondary | ICD-10-CM | POA: Diagnosis not present

## 2022-08-07 DIAGNOSIS — L570 Actinic keratosis: Secondary | ICD-10-CM | POA: Diagnosis not present

## 2022-08-07 DIAGNOSIS — L814 Other melanin hyperpigmentation: Secondary | ICD-10-CM | POA: Diagnosis not present

## 2022-08-07 DIAGNOSIS — Z23 Encounter for immunization: Secondary | ICD-10-CM | POA: Diagnosis not present

## 2022-08-07 DIAGNOSIS — L821 Other seborrheic keratosis: Secondary | ICD-10-CM | POA: Diagnosis not present

## 2022-08-08 NOTE — Progress Notes (Signed)
[name], Hanson ([id number]) Visit Report for 08/04/2022 Chief Complaint Document Details Patient Name: [name]: [name], [name] [name] 08/04/2022 2:30 PM Medical Record Number: [id number] Patient Account Number: [id number] Date of Birth/Sex: Treating RN: 1941-05-15 (81 y.o. 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 08/04/2022 2:30 PM Medical Record Number: [id number] Patient Account Number: [id number] Date of Birth/Sex: Treating RN: 31-Jan-1941 (81 y.o. Waldron Session Primary Care Provider: [name] Other Clinician: Referring Provider: Treating Provider/Extender: Benedict Needy Weeks in Treatment: 3 Debridement Performed for Assessment: Wound #7 Right,Anterior Ankle Performed By: Physician Fredirick Maudlin, MD Debridement Type: Debridement Severity of Tissue Pre Debridement: Fat layer exposed Level of Consciousness (Pre-procedure): Awake and Alert Pre-procedure Verification/Time Out Yes - 15:05 Taken: Start Time: 15:06 Pain Control: Lidocaine 4% T opical Solution T Area Debrided (L x W): otal 0.4 (cm) x 0.6 (cm) = 0.24 (cm) Tissue and other material  debrided: Non-Viable, Slough, Slough Level: Non-Viable Tissue Debridement Description: Selective/Open Wound Instrument: Curette Bleeding: Minimum Hemostasis Achieved: Pressure Procedural Pain: 0 Post Procedural Pain: 0 Response to Treatment: Procedure was tolerated well Level of Consciousness (Post- Awake and Alert procedure): Post Debridement Measurements of Total Wound Length: (cm) 0.4 Width: (cm) 0.6 Depth: (cm) 0.1 Volume: (cm) 0.019 Character of Wound/Ulcer Post Debridement: Requires Further Debridement Severity of Tissue Post Debridement: Fat layer exposed Post Procedure Diagnosis Same as Pre-procedure Notes Scribed for Dr. Celine Ahr by Blanche East, RN Electronic Signature(s) Signed: 08/04/2022 4:13:56 PM By: Fredirick Maudlin MD FACS Signed: 08/08/2022 4:38:59 PM By: Blanche East RN Entered By: Blanche East on 08/04/2022 15:07:09 -------------------------------------------------------------------------------- Debridement Details Patient Name: [name]: [name] [name] 08/04/2022 2:30 PM Medical Record Number: [id number] Patient Account Number: [id number] Date of Birth/Sex: Treating RN: [birth year] (81 y.o. Waldron Session Primary Care Provider: [name] Other Clinician: Referring Provider: Treating Provider/Extender: Benedict Needy Weeks in Treatment: 3 Debridement Performed for Assessment: Wound #9 Right,Anterior Lower Leg Performed By: Physician Fredirick Maudlin, MD Debridement Type: Debridement Level of Consciousness (Pre-procedure): Awake and Alert Pre-procedure Verification/Time Out Yes - 15:05 Taken: Start Time: 15:06 Pain Control: Lidocaine 4% T opical Solution T Area Debrided (L x W): otal 2.2 (cm) x 1 (cm) = 2.2 (cm) Tissue and other material debrided: Viable, Non-Viable, Slough, Subcutaneous, Slough Level: Skin/Subcutaneous Tissue Debridement Description: Excisional Instrument: Curette Bleeding:  Minimum Hemostasis Achieved: Pressure Procedural Pain: 0 Post Procedural Pain: 0 Response to Treatment: Procedure was tolerated well Level of Consciousness (Post- Awake and Alert procedure): Post Debridement Measurements of Total Wound Length: (cm) 2.2 Width: (cm) 1 Depth: (cm) 0.1 Volume: (cm) 0.173 Character of Wound/Ulcer Post Debridement: Requires Further Debridement Post Procedure Diagnosis Same as Pre-procedure Notes Scribed for Dr. Celine Ahr by Blanche East, RN Electronic Signature(s) Signed: 08/04/2022 4:13:56 PM By: Fredirick Maudlin MD FACS Signed: 08/08/2022 4:38:59 PM By: Blanche East RN  Entered By: Blanche East on 08/04/2022 15:07:40 -------------------------------------------------------------------------------- HPI Details Patient Name: [name]: [name], [name] [name] 08/04/2022 2:30 PM Medical Record Number: [id number] Patient Account Number: [id number] Date of Birth/Sex: Treating RN: 1941-05-21 (81 y.o. Waldron Session Primary Care Provider: [name] Other Clinician: Referring Provider: Treating Provider/Extender: Benedict Needy Weeks in Treatment: 3 History of Present Illness HPI Description: ADMISSION 02/07/2021 This is a 81 year old man who is not a diabetic "prediabetic" who comes in today letter asking [place] to look at an area on his right first toe tip that has been there about a year. He has been getting this dressed that [employer] and he had Xeroform on this with gauze. The patient is not exactly sure how this came about but he is not able to dress the wound himself because of limitations due to what sounds like spinal stenosis and pain. He had a wound on the ankle as well but that healed over. He was seen by vascular in May 2021. This was related to a right great toe wound. He had an angiogram that was completely normal on both sides good perfusion right into the feet. He also looks like he has lymphedema and he has compression  stockings but he cannot get them on and off so he essentially leaves he is on even when he showering Past medical history includes prediabetes, low back pain secondary I think the lumbar spinal stenosis has been offered a decompressive laminectomy but he has not gone forth with this. 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 ABI was noncompressible in our clinic on the right right first toe. 4/7; patient I admitted the clinic last week. He has an area on the tip of his right first toe. We cleaned this off last week and have been using silver collagen. He is in the same crocs today that he wore last week and I told him not to use. For 1 reason or another he could not handle a surgical sandal. 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 He is not a diabetic 4/12; right first toe wound chronic. We have been using silver collagen. Once again he comes in then the same pair of old crocs that he comes in every week. 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 His x-ray suggested suspicious for osteomyelitis with bone irregularity at the tuft of the right great toe 4/21; patient has been using silver collagen every other day to the wound on his right great toe. He continues to wear crocs. He has no complaints or issues today. 5/5; 2-week follow-up. 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 Mild soft tissue edema with enhancement at the distal aspect of the great toe suggestive of cellulitis. Fortunately the wound actually looks better. Has been using Hydrofera Blue 5/12; started him on Augmentin last week 875 twice daily he is tolerating this well. Last creatinine I see is 1.77 in early April. We are using Hydrofera Blue to the wound which actually looks better today. 5/17; tolerating the Augmentin well. He says the  wound is actually been hurting him episodically. Actually the surface of this looks quite good. He did not get his lab work done. We are using [facility] he is changing this himself 6/2; still taking Augmentin  he should be rounding into his last week. This was empirically for underlying osteomyelitis. The area on the tip of his toe is still open still with some depth but no palpable bone. We have been using Hydrofera Blue. Surface area of the wound has not been making much improvement Is work was essentially unremarkable except for a creatinine of 1.79. Reason for his renal insufficiency is not clear he is not a diabetic does not have hypertension however he does take NSAIDs and Lasix I wonder if this is the issue here. Note that his creatinine on 11/12/2018 was 1.45. Platelet count slightly low at 143 however his inflammatory markers were really in the normal range at 10 and 2 sedimentation rate and C-reactive protein respectively. 6/9; he apparently had another 2 weeks of Augmentin after we look that this last time. Therefore he should be coming into his last week next week. We are using polymen on the tip of the toe. He cannot get down to do the dressing so he comes in for a nurse visit. He comes in in crocs I have warned him against this although he says he is using a different shoe for most of the day he works in [employer]. 6/16; he comes back in in crocs. I have spoken him about this before. His areas on the tip of his toe this actually looks quite good we have been using polymen. He has completed his antibiotics which we empirically gave him for underlying osteomyelitis. Everything looks better to me in spite of his noncompliance with footwear recommendation 7/11; patient has been followed through nurse visits. He was last seen by provider on 6/16. He reports improvement to the wound with the use of PolyMem. He denies signs of infection. 7/21; the patient arrives with the area on the tip  of his right great toe completely healed. He has the same proximal on that I see every time. I treated him empirically with Augmentin for osteomyelitis. He needs to be vigilant about this. READMISSION 03/19/2022 The patient returns with a reopening of the right great toe wound. Apparently this occurred in around October of last year. He was followed by podiatry for some time. They have performed x-rays but unfortunately, we do not have access to them or the results. No report as to whether or not he might have osteomyelitis. In addition, he has a new wound on the medial aspect of the right great toe as well as the anterior tibial surface of his right lower leg. He does state that he was wearing compression stockings when this tibial wound occurred. He continues to wear crocs. 04/02/2022: For some reason, the patient has not been seen in 2 weeks. He says that he had to reschedule his visit. Therefore his Coflex compression wrap was left in situ for 14 days. It appears that his skin became macerated and he has opened up several new superficial wounds on his right anterior tibial surface. We did get an x-ray to evaluate for osteomyelitis and none was seen. All of the pre-existing wounds are smaller today and the new wounds look like they are limited to just the breakdown of skin. 04/10/2022: The wounds on his right anterior tibial surface are actually a little bit larger today. He seems to have some skin irritation and maceration in the area again. The tip of his toe is nearly closed as is the wound on the dorsal surface of his right great toe. READMISSION 07/10/2022 Since our last visit, the patient went  to the operating room to have a left hip replacement, but in the preop holding area, his surgeon noted a foul odor coming from his right foot. He contacted me and described the findings of his right great toe and we mutually agreed, along with the patient, that amputation would be most appropriate. 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 Since his surgery, he has been wearing compression stockings bilaterally, but as he is unable to apply or remove them himself, he wears them for a week at a time. This appears to have resulted in a pressure injury on his right anterior ankle at the point of flexion; he has a similar looking abrasion that has not broken the skin in the same site on the left. 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 07/21/2022: The right anterior ankle wound is nearly closed aside from a small open area that has some slough accumulation. The amputation site has crusting and eschar present. Once this was removed, it was revealed that the amputation site has healed. The abrasion on the left anterior ankle at the point of flexion is no longer present. 07/28/2022: The patient dropped a door on his leg and has a new wound to his right anterior tibial surface. There is some nonviable skin hanging from the cranial portion of the wound. The fat layer is exposed. The right anterior ankle wound is a little bit smaller and has some accumulated slough present. 08/04/2022: The wound on his right anterior tibial surface is a little bit smaller today. There is still slough accumulation. The right anterior ankle wound is quite a bit smaller today and also has slough buildup on the surface. Electronic Signature(s) Signed: 08/04/2022 3:17:11 PM By: Fredirick Maudlin MD FACS Entered By: Fredirick Maudlin on 08/04/2022 15:17:11 -------------------------------------------------------------------------------- Physical Exam Details Patient Name: [name]: [name] [name] 08/04/2022 2:30 PM Medical Record Number: [id number] Patient Account Number: [id number] Date of Birth/Sex: Treating RN: 01/17/1941 (81 y.o. Waldron Session Primary Care Provider: [name] Other Clinician: Referring  Provider: Treating Provider/Extender: Benedict Needy Weeks in Treatment: 3 Constitutional . . . . No acute distress.[name] Respiratory Normal work of breathing on room air.. Notes 08/04/2022: The wound on his right anterior tibial surface is a little bit smaller today. There is still slough accumulation. The right anterior ankle wound is quite a bit smaller today and also has slough buildup on the surface. Electronic Signature(s) Signed: 08/04/2022 3:26:20 PM By: Fredirick Maudlin MD FACS Previous Signature: 08/04/2022 3:17:45 PM Version By: Fredirick Maudlin MD FACS Entered By: Fredirick Maudlin on 08/04/2022 15:26:20 -------------------------------------------------------------------------------- Physician Orders Details Patient Name: [name]: [name] [name] 08/04/2022 2:30 PM Medical Record Number: [id number] Patient Account Number: [id number] Date of Birth/Sex: Treating RN: [birth year] (81 y.o. 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 - Dr. Celine Ahr- Rm 4 Return A Monday Anesthetic Wound #7 Right,Anterior Ankle (In clinic) Topical Lidocaine 4% applied to wound bed - in clinic, prior to debridement Bathing/  Shower/ Hygiene May shower and wash wound with soap and water. Edema Control - Lymphedema / SCD / Other Left Lower Extremity Elevate legs to the level of the heart or above for 30 minutes daily and/or when sitting, a frequency of: - throughout the day Avoid  standing for long periods of time. Exercise regularly Moisturize legs daily. - both legs nightly Compression stocking or Garment 20-30 mm/Hg pressure to: - both legs daily Wound Treatment Wound #7 - Ankle Wound Laterality: Right, Anterior Cleanser: Soap and Water 1 x Per Week Discharge Instructions: May shower and wash wound with dial antibacterial soap and water prior to dressing change. Prim Dressing: KerraCel Ag Gelling Fiber Dressing, 2x2 in (silver alginate) 1 x Per Week ary Discharge Instructions: Apply silver alginate to wound bed as instructed Secondary Dressing: ALLEVYN Gentle Border, 4x4 (in/in) 1 x Per Week Discharge Instructions: Apply over primary dressing as directed. Wound #9 - Lower Leg Wound Laterality: Right, Anterior Cleanser: Soap and Water 1 x Per Week Discharge Instructions: May shower and wash wound with dial antibacterial soap and water prior to dressing change. Prim Dressing: KerraCel Ag Gelling Fiber Dressing, 2x2 in (silver alginate) 1 x Per Week ary Discharge Instructions: Apply silver alginate to wound bed as instructed Secondary Dressing: ALLEVYN Gentle Border, 4x4 (in/in) 1 x Per Week Discharge Instructions: Apply over primary dressing as directed. Electronic Signature(s) Signed: 08/04/2022 4:13:56 PM By: Fredirick Maudlin MD FACS Entered By: Fredirick Maudlin on 08/04/2022 15:27:26 -------------------------------------------------------------------------------- Problem List Details Patient Name: [name]: [name] [name] 08/04/2022 2:30 PM Medical Record Number: [id number] Patient Account Number: [id number] Date of Birth/Sex: Treating RN: 1941-10-24 (81 y.o. Waldron Session Primary Care Provider: [name] Other Clinician: Referring Provider: Treating Provider/Extender: Benedict Needy Weeks in Treatment: 3 Active Problems ICD-10 Encounter Code Description Active Date MDM Diagnosis L89.512 Pressure ulcer of  right ankle, stage 2 07/10/2022 No Yes N18.30 Chronic kidney disease, stage 3 unspecified 07/10/2022 No Yes I87.2 Venous insufficiency (chronic) (peripheral) 07/10/2022 No Yes L97.812 Non-pressure chronic ulcer of other part of right lower leg with fat layer 07/28/2022 No Yes exposed Inactive Problems ICD-10 Code Description Active Date Inactive Date L97.512 Non-pressure chronic ulcer of other part of right foot with fat layer exposed 07/10/2022 07/10/2022 T81.31XS Disruption of external operation (surgical) wound, not elsewhere classified, sequela 07/10/2022 07/10/2022 Resolved Problems Electronic Signature(s) Signed: 08/04/2022 4:13:56 PM By: Fredirick Maudlin MD FACS Signed: 08/08/2022 4:38:59 PM By: Blanche East RN Previous Signature: 08/04/2022 3:08:20 PM Version By: Fredirick Maudlin MD FACS Entered By: Blanche East on 08/04/2022 15:08:24 -------------------------------------------------------------------------------- Progress Note Details Patient Name: [name]: [name] [name] 08/04/2022 2:30 PM Medical Record Number: [id number] Patient Account Number: [id number] Date of Birth/Sex: Treating RN: [birth year] (81 y.o. 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 He has been getting this dressed that [employer] and he had Xeroform on this with gauze. The patient is not exactly sure how this came about but he is not able to dress the wound himself because of limitations due to what sounds like spinal stenosis and pain. He had a wound on the ankle as well but that healed over. He was seen by vascular in May 2021. This was related to a right great toe wound. He had an angiogram that was completely normal on both sides good perfusion right into the feet. He also looks like he has lymphedema and he has compression stockings but he cannot get them on and off so he essentially leaves he is on even when he showering Past medical history includes prediabetes, low back pain secondary I think the lumbar spinal stenosis has been offered a decompressive laminectomy but he has not gone forth with this. 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 ABI was noncompressible in our clinic on the right right first toe. 4/7; patient I admitted the clinic last week. He has an area on the tip of his right first toe. We cleaned this off last week and have been using silver collagen. He is in the same crocs today that he wore last week and I told him not to use. For 1 reason or another he could not handle a surgical sandal. 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 He is not a diabetic 4/12; right first toe wound chronic. We have been using silver collagen. Once again he comes in then the same pair of old crocs that he comes in every week. 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 His x-ray suggested suspicious for osteomyelitis with bone irregularity at the tuft of the right great toe 4/21; patient has been using silver collagen every other day to the wound on his right great toe. He continues to wear crocs. He has no  complaints or issues today. 5/5; 2-week follow-up. 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 Mild soft tissue edema with enhancement at the distal aspect of the great toe suggestive of cellulitis. Fortunately the wound actually looks better. Has been using Hydrofera Blue 5/12; started him on Augmentin last week 875 twice daily he is tolerating this well. Last creatinine I see is 1.77 in early April. We are using Hydrofera Blue to the wound which actually looks better today. 5/17; tolerating the Augmentin well. He says the wound is actually been hurting him episodically. Actually the surface of this looks quite good. He did not get his lab work done. We are using [facility] he is changing this himself 6/2; still taking Augmentin he should be rounding into his last week. This was empirically for underlying osteomyelitis. The area on the tip of his toe is still open still with some depth but no palpable bone. We have been using Hydrofera Blue. Surface area of the wound has not been making much improvement Is work was essentially unremarkable except for a creatinine of 1.79. Reason for his renal insufficiency is not clear he is not a diabetic does not have hypertension however he does take NSAIDs and Lasix I wonder if this is the issue here. Note that his creatinine on 11/12/2018 was 1.45. Platelet  count slightly low at 143 however his inflammatory markers were really in the normal range at 10 and 2 sedimentation rate and C-reactive protein respectively. 6/9; he apparently had another 2 weeks of Augmentin after we look that this last time. Therefore he should be coming into his last week next week. We are using polymen on the tip of the toe. He cannot get down to do the dressing so he comes in for a nurse visit. He comes in in crocs I have warned him against this although he says he is using a different shoe for most of the day he works in [employer]. 6/16; he comes back in in crocs. I have spoken him about this before. His areas on the tip of his toe this actually looks quite good we have been using polymen. He has completed his antibiotics which we empirically gave him for underlying osteomyelitis. Everything looks better to me in spite of his noncompliance with footwear recommendation 7/11; patient has been followed through nurse visits. He was last seen by provider on 6/16. He reports improvement to the wound with the use of PolyMem. He denies signs of infection. 7/21; the patient arrives with the area on the tip of his right great toe completely healed. He has the same proximal on that I see every time. I treated him empirically with Augmentin for osteomyelitis. He needs to be vigilant about this. READMISSION 03/19/2022 The patient returns with a reopening of the right great toe wound. Apparently this occurred in around October of last year. He was followed by podiatry for some time. They have performed x-rays but unfortunately, we do not have access to them or the results. No report as to whether or not he might have osteomyelitis. In addition, he has a new wound on the medial aspect of the right great toe as well as the anterior tibial surface of his right lower leg. He does state that he was wearing compression stockings when this tibial wound occurred. He continues to wear crocs. 04/02/2022: For some reason, the patient has not been seen in 2 weeks. He says that he had to reschedule his visit. Therefore his Coflex compression wrap was left in situ for 14 days. It appears that his skin became macerated and he has opened up several new superficial wounds on his right anterior tibial surface. We did get an x-ray to evaluate for osteomyelitis and none was seen. All of the pre-existing wounds are smaller today and the new wounds look like they are limited to just the breakdown of skin. 04/10/2022: The wounds on his right anterior  tibial surface are actually a little bit larger today. He seems to have some skin irritation and maceration in the area again. The tip of his toe is nearly closed as is the wound on the dorsal surface of his right great toe. READMISSION 07/10/2022 Since our last visit, the patient went to the operating room to have a left hip replacement, but in the preop holding area, his surgeon noted a foul odor coming from his right foot. He contacted me and described the findings of his right great toe and we mutually agreed, along with the patient, that amputation would be most appropriate. 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 Since his surgery, he has been wearing compression stockings bilaterally, but as he is unable to apply or remove them himself, he wears them for a week at a time. This  appears to have resulted in a pressure injury on his right anterior ankle at the point of flexion; he has a similar looking abrasion that has not broken the skin in the same site on the left. 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 07/21/2022: The right anterior ankle wound is nearly closed aside from a small open area that has some slough accumulation. The amputation site has crusting and eschar present. Once this was removed, it was revealed that the amputation site has healed. The abrasion on the left anterior ankle at the point of flexion is no longer present. 07/28/2022: The patient dropped a door on his leg and has a new wound to his right anterior tibial surface. There is some nonviable skin hanging from the cranial portion of the wound. The fat layer is exposed. The right anterior ankle wound is a little bit smaller and has some accumulated slough present. 08/04/2022: The wound on his right anterior tibial surface is a little bit smaller today. There is still slough accumulation. The right anterior ankle wound is quite  a bit smaller today and also has slough buildup on the surface. Patient History Information obtained from Patient. Family History Diabetes - Maternal Grandparents, Heart Disease - Father, Hypertension - Father, Stroke - Father, Thyroid Problems - Mother,Child, No family history of Cancer, Hereditary Spherocytosis, Kidney Disease, Lung Disease, Seizures, Tuberculosis. Social History Never smoker, Marital Status - Single, Alcohol Use - Rarely, Drug Use - No History, Caffeine Use - Daily - coffee. Medical History Eyes Patient has history of Cataracts - 2019 Ear/Nose/Mouth/Throat Patient has history of Chronic sinus problems/congestion Respiratory Patient has history of Asthma, Sleep Apnea Cardiovascular Patient has history of Hypertension, Peripheral Venous Disease Endocrine Denies history of Type II Diabetes Musculoskeletal Patient has history of Osteoarthritis Medical A Surgical History Notes nd Gastrointestinal GERD Genitourinary BPH Musculoskeletal Degenerative disc disease Neurologic Neuromuscular disorder Objective Constitutional No acute distress.. Vitals Time Taken: 2:42 PM, Height: 68 in, Weight: 245 lbs, BMI: 37.2, Temperature: 97.7 F, Pulse: 92 bpm, Respiratory Rate: 18 breaths/min, Blood Pressure: 135/79 mmHg. Respiratory Normal work of breathing on room air.. General Notes: 08/04/2022: The wound on his right anterior tibial surface is a little bit smaller today. There is still slough accumulation. The right anterior ankle wound is quite a bit smaller today and also has slough buildup on the surface. Integumentary (Hair, Skin) Wound #7 status is Open. Original cause of wound was Footwear Injury. The date acquired was: 07/03/2022. The wound has been in treatment 3 weeks. The wound is located on the Right,Anterior Ankle. The wound measures 0.4cm length x 0.6cm width x 0.1cm depth; 0.188cm^2 area and 0.019cm^3 volume. There is Fat Layer (Subcutaneous Tissue) exposed.  There is no tunneling or undermining noted. There is a medium amount of serosanguineous drainage noted. The wound margin is flat and intact. There is small (1-33%) red, pink granulation within the wound bed. There is a large (67-100%) amount of necrotic tissue within the wound bed including Adherent Slough. Wound #9 status is Open. Original cause of wound was Trauma. The date acquired was: 07/24/2022. The wound has been in treatment 1 weeks. The wound is located on the Right,Anterior Lower Leg. The wound measures 2.2cm length x 1cm width x 0.1cm depth; 1.728cm^2 area and 0.173cm^3 volume. There is Fat Layer (Subcutaneous Tissue) exposed. There is no tunneling or undermining noted. The wound margin is flat and intact. There is large (67-100%) pink granulation within the wound bed.  There is a small (1-33%) amount of necrotic tissue within the wound bed including Adherent Slough. 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 There was a Selective/Open Wound Non-Viable Tissue Debridement with a total area of 0.24 sq cm performed by Fredirick Maudlin, MD. With the following instrument(s): Curette to remove Non-Viable tissue/material. Material removed includes [facility] after achieving pain control using Lidocaine 4% Topical Solution. No specimens were taken. A time out was conducted at 15:05, prior to the start of the procedure. A Minimum amount of bleeding was controlled with Pressure. The procedure was tolerated well with a pain level of 0 throughout and a pain level of 0 following the procedure. Post Debridement Measurements: 0.4cm  length x 0.6cm width x 0.1cm depth; 0.019cm^3 volume. Character of Wound/Ulcer Post Debridement requires further debridement. Severity of Tissue Post Debridement is: Fat layer exposed. Post procedure Diagnosis Wound #7: Same as Pre-Procedure General Notes: Scribed for Dr. Celine Ahr by Blanche East, RN. Wound #9 Pre-procedure diagnosis of Wound #9 is a Trauma, Other located on the Right,Anterior Lower Leg . There was a Excisional Skin/Subcutaneous Tissue Debridement with a total area of 2.2 sq cm performed by Fredirick Maudlin, MD. With the following instrument(s): Curette to remove Viable and Non-Viable tissue/material. Material removed includes Subcutaneous Tissue and Slough and after achieving pain control using Lidocaine 4% T opical Solution. No specimens were taken. A time out was conducted at 15:05, prior to the start of the procedure. A Minimum amount of bleeding was controlled with Pressure. The procedure was tolerated well with a pain level of 0 throughout and a pain level of 0 following the procedure. Post Debridement Measurements: 2.2cm length x 1cm width x 0.1cm depth; 0.173cm^3 volume. Character of Wound/Ulcer Post Debridement requires further debridement. Post procedure Diagnosis Wound #9: Same as Pre-Procedure General Notes: Scribed for Dr. Celine Ahr by Blanche East, RN. Plan Follow-up Appointments: Return Appointment in 1 week. - Dr. Celine Ahr- Rm 4 Monday Anesthetic: Wound #7 Right,Anterior Ankle: (In clinic) Topical Lidocaine 4% applied to wound bed - in clinic, prior to debridement Bathing/ Shower/ Hygiene: May shower and wash wound with soap and water. Edema Control - Lymphedema / SCD / Other: Elevate legs to the level of the heart or above for 30 minutes daily and/or when sitting, a frequency of: - throughout the day Avoid standing for long periods of time. Exercise regularly Moisturize legs daily. - both legs nightly Compression stocking or Garment 20-30 mm/Hg pressure to: -  both legs daily WOUND #7: - Ankle Wound Laterality: Right, Anterior Cleanser: Soap and Water 1 x Per Week/ Discharge Instructions: May shower and wash wound with dial antibacterial soap and water prior to dressing change. Prim Dressing: KerraCel Ag Gelling Fiber Dressing, 2x2 in (silver alginate) 1 x Per Week/ ary Discharge Instructions: Apply silver alginate to wound bed as instructed Secondary Dressing: ALLEVYN Gentle Border, 4x4 (in/in) 1 x Per Week/ Discharge Instructions: Apply over primary dressing as directed. WOUND #9: - Lower Leg Wound Laterality: Right, Anterior Cleanser: Soap and Water 1 x Per Week/ Discharge Instructions: May shower and wash wound with dial antibacterial soap and water prior to dressing change. Prim Dressing: KerraCel Ag Gelling Fiber Dressing, 2x2 in (  silver alginate) 1 x Per Week/ ary Discharge Instructions: Apply silver alginate to wound bed as instructed Secondary Dressing: ALLEVYN Gentle Border, 4x4 (in/in) 1 x Per Week/ Discharge Instructions: Apply over primary dressing as directed. 08/04/2022: The wound on his right anterior tibial surface is a little bit smaller today. There is still slough accumulation. The right anterior ankle wound is quite a bit smaller today and also has slough buildup on the surface. I used a curette to debride slough from the ankle wound and slough and nonviable subcutaneous tissue from the anterior tibial wound. We will continue to apply silver alginate to both sites with foam border dressings and the patient's own compression socks. Follow-up in 1 week. Electronic Signature(s) Signed: 08/04/2022 3:28:29 PM By: Fredirick Maudlin MD FACS Entered By: Fredirick Maudlin on 08/04/2022 15:28:29 -------------------------------------------------------------------------------- HxROS Details Patient Name: [name]: [name] [name] 08/04/2022 2:30 PM Medical Record Number: [id number] Patient Account Number: [id number] Date of  Birth/Sex: Treating RN: [birth year] (81 y.o. Waldron Session Primary Care Provider: [name] Other Clinician: Referring Provider: Treating Provider/Extender: Benedict Needy Weeks in Treatment: 3 Information Obtained From Patient Eyes Medical History: Positive for: Cataracts - 2019 Ear/Nose/Mouth/Throat Medical History: Positive for: Chronic sinus problems/congestion Respiratory Medical History: Positive for: Asthma; Sleep Apnea Cardiovascular Medical History: Positive for: Hypertension; Peripheral Venous Disease Gastrointestinal Medical History: Past Medical History Notes: GERD Endocrine Medical History: Negative for: Type II Diabetes Genitourinary Medical History: Past Medical History Notes: BPH Musculoskeletal Medical History: Positive for: Osteoarthritis Past Medical History Notes: Degenerative disc disease Neurologic Medical History: Past Medical History Notes: Neuromuscular disorder HBO Extended History Items Ear/Nose/Mouth/Throat: Eyes: Chronic sinus Cataracts problems/congestion Immunizations Pneumococcal Vaccine: Received Pneumococcal Vaccination: Yes Received Pneumococcal Vaccination On or After 60th Birthday: No Implantable Devices None Family and Social History Cancer: No; Diabetes: Yes - Maternal Grandparents; Heart Disease: Yes - Father; Hereditary Spherocytosis: No; Hypertension: Yes - Father; Kidney Disease: No; Lung Disease: No; Seizures: No; Stroke: Yes - Father; Thyroid Problems: Yes - Mother,Child; Tuberculosis: No; Never smoker; Marital Status - Single; Alcohol Use: Rarely; Drug Use: No History; Caffeine Use: Daily - coffee; Financial Concerns: No; Food, Clothing or Shelter Needs: No; Support System Lacking: No; Transportation Concerns: No Electronic Signature(s) Signed: 08/04/2022 4:13:56 PM By: Fredirick Maudlin MD FACS Signed: 08/08/2022 4:38:59 PM By: Blanche East RN Entered By: Fredirick Maudlin on 08/04/2022  15:17:26 -------------------------------------------------------------------------------- SuperBill Details Patient Name: [name]: [name] [name] 08/04/2022 Medical Record Number: [id number] Patient Account Number: [id number] Date of Birth/Sex: Treating RN: [birth year] (81 y.o. Waldron Session Primary Care Provider: [name] Other Clinician: Referring Provider: Treating Provider/Extender: Benedict Needy Weeks in Treatment: 3 Diagnosis Coding ICD-10 Codes Code Description [phone] Pressure ulcer of right ankle, stage 2 N18.30 Chronic kidney disease, stage 3 unspecified I87.2 Venous insufficiency (chronic) (peripheral) L97.812 Non-pressure chronic ulcer of other part of right lower leg with fat layer exposed Facility Procedures CPT4 Code: 24580998 Description: 11042 - DEB SUBQ TISSUE 20 SQ CM/< ICD-10 Diagnosis Description L97.812 Non-pressure chronic ulcer of other part of right lower leg with fat layer expos Modifier: ed Quantity: 1 CPT4 Code: 33825053 Description: 97673 - DEBRIDE WOUND 1ST 20 SQ CM OR < ICD-10 Diagnosis Description L89.512 Pressure ulcer of right ankle, stage 2 Modifier: Quantity: 1 Physician Procedures : CPT4 Code Description Modifier [id number] 24097 - WC PHYS LEVEL 3 - EST PT 25 ICD-10 Diagnosis Description L89.512 Pressure ulcer of right ankle, stage 2 L97.812 Non-pressure chronic  ulcer of other part of right lower leg with fat layer exposed I87.2  Venous insufficiency (chronic) (peripheral) N18.30 Chronic kidney disease, stage 3 unspecified Quantity: 1 : [id number] 11042 - WC PHYS SUBQ TISS 20 SQ CM ICD-10 Diagnosis Description L97.812 Non-pressure chronic ulcer of other part of right lower leg with fat layer exposed Quantity: 1 : [id number] 80165 - WC PHYS DEBR WO ANESTH 20 SQ CM ICD-10 Diagnosis Description L89.512 Pressure ulcer of right ankle, stage 2 Quantity: 1 Electronic Signature(s) Signed: 08/04/2022 3:29:35 PM By:  Fredirick Maudlin MD FACS Entered By: Fredirick Maudlin on 08/04/2022 15:29:35

## 2022-08-08 NOTE — Progress Notes (Signed)
IZEAR, PINE ([id number]) Visit Report for 08/04/2022 Arrival Information Details Patient Name: [name]: HURSHELL, DINO [name] 08/04/2022 2:30 PM Medical Record Number: [id number] Patient Account Number: [id number] Date of Birth/Sex: Treating RN: 01-29-1941 (81 y.o. Waldron Session Primary Care Jaiceon Collister: [name] Other Clinician: Referring Yumi Insalaco: Treating Doree Kuehne/Extender: Benedict Needy Weeks in Treatment: 3 Visit Information History Since Last Visit All ordered tests and consults were completed: Yes Patient Arrived: [name] Added or deleted any medications: No Arrival Time: 14:39 Any new allergies or adverse reactions: No Accompanied By: self Had a fall or experienced change in No Transfer Assistance: None activities of daily living that may affect Patient Identification Verified: Yes risk of falls: Secondary Verification Process Completed: Yes Signs or symptoms of abuse/neglect since last visito No Patient Requires Transmission-Based Precautions: No Hospitalized since last visit: No Patient Has Alerts: Yes Has Dressing in Place as Prescribed: Yes Patient Alerts: ABI LLE 1.16, RLE 1.14 Has Compression in Place as Prescribed: Yes Pain Present Now: Yes Electronic Signature(s) Signed: 08/08/2022 4:38:59 PM By: Blanche East RN Entered By: Blanche East on 08/04/2022 14:40:09 -------------------------------------------------------------------------------- Encounter Discharge Information Details [name] 08/04/2022 2:30 PM Medical Record Number: [id number] Patient Account Number: [id number] Date of Birth/Sex: Treating RN: 04/15/1941 (81 y.o. Waldron Session Primary Care Jayde Mcallister: [name] Other Clinician: Referring Idalis Hoelting: Treating Jerrin Recore/Extender: Benedict Needy Weeks in Treatment: 3 Encounter Discharge Information Items Post Procedure Vitals Discharge Condition:  Stable Temperature (F): 97.7 Ambulatory Status: Cane Pulse (bpm): 92 Discharge Destination: Home Respiratory Rate (breaths/min): 18 Transportation: Private Auto Blood Pressure (mmHg): 135/79 Accompanied By: self Schedule Follow-up Appointment: Yes Clinical Summary of Care: Electronic Signature(s) Signed: 08/08/2022 4:38:59 PM By: Blanche East RN Entered By: Blanche East on 08/04/2022 15:09:21 -------------------------------------------------------------------------------- Lower Extremity Assessment Details [name] 08/04/2022 2:30 PM Medical Record Number: [id number] Patient Account Number: [id number] Date of Birth/Sex: Treating RN: [birth year] (81 y.o. Waldron Session Primary Care Airlie Blumenberg: [name] Other Clinician: Referring Hubert Derstine: Treating Meryl Ponder/Extender: Benedict Needy Weeks in Treatment: 3 Edema Assessment Assessed: [Left: No] [Right: No] E[Left: dema] [Right: :] Calf Left: Right: Point of Measurement: From Medial Instep 36 cm 40 cm Ankle Left: Right: Point of Measurement: From Medial Instep 25 cm 26 cm Vascular Assessment Pulses: Dorsalis Pedis Palpable: [Left:Yes] [Right:Yes] Electronic Signature(s) Signed: 08/08/2022 4:38:59 PM By: Blanche East RN Entered By: Blanche East on 08/04/2022 14:47:00 -------------------------------------------------------------------------------- Multi Wound Chart Details [name] 08/04/2022 2:30 PM Medical Record Number: [id number] Patient Account Number: [id number] Date of Birth/Sex: Treating RN: 1941/09/18 (81 y.o. Waldron Session Primary Care Joelys Staubs: [name] Other Clinician: Referring Warnie Belair: Treating Patrice Matthew/Extender: Benedict Needy Weeks in Treatment: 3 Vital Signs Height(in): 68 Pulse(bpm): 18 Weight(lbs): 245 Blood Pressure(mmHg): 135/79 Body Mass Index(BMI):  37.2 Temperature(F): 97.7 Respiratory Rate(breaths/min): 18 Photos: [7:Right, Anterior Ankle] [9:Right, Anterior Lower Leg] [N/A:N/A N/A] Wound Location: [7:Footwear Injury] [9:Trauma] [N/A:N/A] Wounding Event: [7:Venous Leg Ulcer] [9:Trauma, Other] [N/A:N/A] Primary Etiology: [7:Cataracts, Chronic sinus] [9:Cataracts, Chronic sinus] [N/A:N/A] Comorbid History: [7:problems/congestion, Asthma, Sleep problems/congestion, Asthma, Sleep Apnea, Hypertension, Peripheral Venous Disease, Osteoarthritis 07/03/2022] [9:Apnea, Hypertension, Peripheral Venous Disease, Osteoarthritis 07/24/2022] [N/A:N/A] Date Acquired: [7:3] [9:1] [N/A:N/A] Weeks of Treatment: [7:Open] [9:Open] [N/A:N/A] Wound Status: [7:No] [9:No] [N/A:N/A] Wound Recurrence: [7:0.4x0.6x0.1] [9:2.2x1x0.1] [N/A:N/A] Measurements L x W x D (cm) [7:0.188] [9:1.728] [N/A:N/A] A (cm) :  rea [7:0.019] [9:0.173] [N/A:N/A] Volume (cm) : [7:86.70%] [9:26.70%] [N/A:N/A] % Reduction in A [7:rea: 86.50%] [9:26.70%] [N/A:N/A] % Reduction in Volume: [7:Full Thickness Without Exposed] [9:Full Thickness Without Exposed] [N/A:N/A] Classification: [7:Support Structures Medium] [9:Support Structures N/A] [N/A:N/A] Exudate A mount: [7:Serosanguineous] [9:N/A] [N/A:N/A] Exudate Type: [7:red, brown] [9:N/A] [N/A:N/A] Exudate Color: [7:Flat and Intact] [9:Flat and Intact] [N/A:N/A] Wound Margin: [7:Small (1-33%)] [9:Large (67-100%)] [N/A:N/A] Granulation A mount: [7:Red, Pink] [9:Pink] [N/A:N/A] Granulation Quality: [7:Large (67-100%)] [9:Small (1-33%)] [N/A:N/A] Necrotic A mount: [7:Fat Layer (Subcutaneous Tissue): Yes Fat Layer (Subcutaneous Tissue): Yes N/A] Exposed Structures: [7:Fascia: No Tendon: No Muscle: No Joint: No Bone: No Small (1-33%)] [9:Fascia: No Tendon: No Muscle: No Joint: No Bone: No Medium (34-66%)] [N/A:N/A] Epithelialization: [7:Debridement - Selective/Open Wound Debridement - Excisional] [N/A:N/A] Debridement: Pre-procedure  Verification/Time Out 15:05 [9:15:05] [N/A:N/A] Taken: [7:Lidocaine 4% Topical Solution] [9:Lidocaine 4% Topical Solution] [N/A:N/A] Pain Control: [7:Slough] [9:Subcutaneous, Slough] [N/A:N/A] Tissue Debrided: [7:Non-Viable Tissue] [9:Skin/Subcutaneous Tissue] [N/A:N/A] Level: [7:0.24] [9:2.2] [N/A:N/A] Debridement A (sq cm): [7:rea Curette] [9:Curette] [N/A:N/A] Instrument: [7:Minimum] [9:Minimum] [N/A:N/A] Bleeding: [7:Pressure] [9:Pressure] [N/A:N/A] Hemostasis A chieved: [7:0] [9:0] [N/A:N/A] Procedural Pain: [7:0] [9:0] [N/A:N/A] Post Procedural Pain: [7:Procedure was tolerated well] [9:Procedure was tolerated well] [N/A:N/A] Debridement Treatment Response: [7:0.4x0.6x0.1] [9:2.2x1x0.1] [N/A:N/A] Post Debridement Measurements L x W x D (cm) [7:0.019] [9:0.173] [N/A:N/A] Post Debridement Volume: (cm) [7:Debridement] [9:Debridement] [N/A:N/A] Treatment Notes Electronic Signature(s) Signed: 08/04/2022 3:08:26 PM By: Fredirick Maudlin MD FACS Signed: 08/08/2022 4:38:59 PM By: Blanche East RN Entered By: Fredirick Maudlin on 08/04/2022 15:08:26 -------------------------------------------------------------------------------- Multi-Disciplinary Care Plan Details [name] 08/04/2022 2:30 PM Medical Record Number: [id number] Patient Account Number: [id number] Date of Birth/Sex: Treating RN: 01-Aug-1941 (81 y.o. Waldron Session Primary Care Zabian Swayne: [name] Other Clinician: Referring Tamela Elsayed: Treating Tylan Briguglio/Extender: Benedict Needy Weeks in Treatment: 3 Active Inactive Venous Leg Ulcer Nursing Diagnoses: Potential for venous Insuffiency (use before diagnosis confirmed) Goals: Patient will maintain optimal edema control Date Initiated: 07/10/2022 Target Resolution Date: 08/07/2022 Goal Status: Active Interventions: Assess peripheral edema status every visit. Treatment Activities: Therapeutic compression  applied : 07/10/2022 Notes: Wound/Skin Impairment Nursing Diagnoses: Impaired tissue integrity Goals: Ulcer/skin breakdown will have a volume reduction of 30% by week 4 Date Initiated: 07/10/2022 Target Resolution Date: 08/07/2022 Goal Status: Active Interventions: Provide education on ulcer and skin care Treatment Activities: Skin care regimen initiated : 07/10/2022 Topical wound management initiated : 07/10/2022 Notes: Electronic Signature(s) Signed: 08/08/2022 4:38:59 PM By: Blanche East RN Entered By: Blanche East on 08/04/2022 15:00:55 -------------------------------------------------------------------------------- Pain Assessment Details [name] 08/04/2022 2:30 PM Medical Record Number: [id number] Patient Account Number: [id number] Date of Birth/Sex: Treating RN: [birth year] (81 y.o. Waldron Session Primary Care Nikhita Mentzel: [name] Other Clinician: Referring Chief Walkup: Treating Adonai Selsor/Extender: Benedict Needy Weeks in Treatment: 3 Active Problems Location of Pain Severity and Description of Pain Patient Has Paino Yes Site Locations Rate the pain. Rate the pain. Current Pain Level: 5 Character of Pain Describe the Pain: Aching Pain Management and Medication Current Pain Management: Electronic Signature(s) Signed: 08/08/2022 4:38:59 PM By: Blanche East RN Entered By: Blanche East on 08/04/2022 14:43:12 -------------------------------------------------------------------------------- Patient/Caregiver Education Details [name] 9/25/2023andnbsp2:30 PM Medical Record Number: [id number] Patient Account Number: [id number] Date of Birth/Gender: Treating RN: Oct 23, 1941 (81 y.o. Waldron Session Primary Care Physician: [name] Other Clinician: Referring Physician: Treating Physician/Extender: Benedict Needy Weeks in Treatment:  3 Education Assessment Education Provided To: Patient Education Topics Provided Wound/Skin Impairment: Methods: Explain/Verbal Responses: Reinforcements needed, State content correctly Electronic Signature(s) Signed: 08/08/2022 4:38:59 PM By: Blanche East RN Entered By: Blanche East on 08/04/2022 15:07:58 -------------------------------------------------------------------------------- Wound Assessment Details [name] 08/04/2022 2:30 PM Medical Record Number: [id number] Patient Account Number: [id number] Date of Birth/Sex: Treating RN: [birth year] (81 y.o. Waldron Session Primary Care Jamesetta Greenhalgh: Other Clinician: [name] Referring Katheryne Gorr: Treating Saudia Smyser/Extender: Benedict Needy Weeks in Treatment: 3 Wound Status Wound Number: 7 Primary Venous Leg Ulcer Etiology: Wound Location: Right, Anterior Ankle Wound Open Wounding Event: Footwear Injury Status: Date Acquired: 07/03/2022 Comorbid Cataracts, Chronic sinus problems/congestion, Asthma, Sleep Weeks Of Treatment: 3 History: Apnea, Hypertension, Peripheral Venous Disease, Osteoarthritis Clustered Wound: No Photos Wound Measurements Length: (cm) 0.4 Width: (cm) 0.6 Depth: (cm) 0.1 Area: (cm) 0.188 Volume: (cm) 0.019 % Reduction in Area: 86.7% % Reduction in Volume: 86.5% Epithelialization: Small (1-33%) Tunneling: No Undermining: No Wound Description Classification: Full Thickness Without Exposed Support Structures Wound Margin: Flat and Intact Exudate Amount: Medium Exudate Type: Serosanguineous Exudate Color: red, brown Foul Odor After Cleansing: No Slough/Fibrino Yes Wound Bed Granulation Amount: Small (1-33%) Exposed Structure Granulation Quality: Red, Pink Fascia Exposed: No Necrotic Amount: Large (67-100%) Fat Layer (Subcutaneous Tissue) Exposed: Yes Necrotic Quality: Adherent Slough Tendon Exposed: No Muscle Exposed: No Joint  Exposed: No Bone Exposed: No Treatment Notes Wound #7 (Ankle) Wound Laterality: Right, Anterior Cleanser Soap and Water Discharge Instruction: May shower and wash wound with dial antibacterial soap and water prior to dressing change. Peri-Wound Care Topical Primary Dressing KerraCel Ag Gelling Fiber Dressing, 2x2 in (silver alginate) Discharge Instruction: Apply silver alginate to wound bed as instructed Secondary Dressing ALLEVYN Gentle Border, 4x4 (in/in) Discharge Instruction: Apply over primary dressing as directed. Secured With Compression Wrap Compression Stockings Environmental education officer) Signed: 08/08/2022 4:38:59 PM By: Blanche East RN Entered By: Blanche East on 08/04/2022 14:59:15 -------------------------------------------------------------------------------- Wound Assessment Details [name] 08/04/2022 2:30 PM Medical Record Number: [id number] Patient Account Number: [id number] Date of Birth/Sex: Treating RN: 1941/10/28 (81 y.o. Waldron Session Primary Care Ezekiah Massie: [name] Other Clinician: Referring Shanti Eichel: Treating Sheleen Conchas/Extender: Benedict Needy Weeks in Treatment: 3 Wound Status Wound Number: 9 Primary Trauma, Other Etiology: Wound Location: Right, Anterior Lower Leg Wound Open Wounding Event: Trauma Status: Date Acquired: 07/24/2022 Comorbid Cataracts, Chronic sinus problems/congestion, Asthma, Sleep Weeks Of Treatment: 1 History: Apnea, Hypertension, Peripheral Venous Disease, Osteoarthritis Clustered Wound: No Photos Wound Measurements Length: (cm) 2.2 Width: (cm) 1 Depth: (cm) 0.1 Area: (cm) 1.728 Volume: (cm) 0.173 % Reduction in Area: 26.7% % Reduction in Volume: 26.7% Epithelialization: Medium (34-66%) Tunneling: No Undermining: No Wound Description Classification: Full Thickness Without Exposed Support Structures Wound Margin: Flat and Intact Foul  Odor After Cleansing: No Slough/Fibrino Yes Wound Bed Granulation Amount: Large (67-100%) Exposed Structure Granulation Quality: Pink Fascia Exposed: No Necrotic Amount: Small (1-33%) Fat Layer (Subcutaneous Tissue) Exposed: Yes Necrotic Quality: Adherent Slough Tendon Exposed: No Muscle Exposed: No Joint Exposed: No Bone Exposed: No Treatment Notes Wound #9 (Lower Leg) Wound Laterality: Right, Anterior Cleanser Soap and Water Discharge Instruction: May shower and wash wound with dial antibacterial soap and water prior to dressing change. Peri-Wound Care Topical Primary Dressing KerraCel Ag Gelling Fiber Dressing, 2x2 in (silver alginate) Discharge Instruction: Apply silver alginate to wound bed as instructed Secondary Dressing ALLEVYN Gentle Border, 4x4 (in/in)  Discharge Instruction: Apply over primary dressing as directed. Secured With Compression Wrap Compression Stockings Environmental education officer) Signed: 08/08/2022 4:38:59 PM By: Blanche East RN Entered By: Blanche East on 08/04/2022 14:59:42 -------------------------------------------------------------------------------- Vitals Details [name] 08/04/2022 2:30 PM Medical Record Number: [id number] Patient Account Number: [id number] Date of Birth/Sex: Treating RN: Jan 22, 1941 (81 y.o. Waldron Session Primary Care Gorman Safi: [name] Other Clinician: Referring Bartosz Luginbill: Treating Amal Renbarger/Extender: Benedict Needy Weeks in Treatment: 3 Vital Signs Time Taken: 14:42 Temperature (F): 97.7 Height (in): 68 Pulse (bpm): 92 Weight (lbs): 245 Respiratory Rate (breaths/min): 18 Body Mass Index (BMI): 37.2 Blood Pressure (mmHg): 135/79 Reference Range: 80 - 120 mg / dl Electronic Signature(s) Signed: 08/08/2022 4:38:59 PM By: Blanche East RN Entered By: Blanche East on 08/04/2022 14:42:56

## 2022-08-13 ENCOUNTER — Encounter (HOSPITAL_BASED_OUTPATIENT_CLINIC_OR_DEPARTMENT_OTHER): Payer: Medicare Other | Attending: General Surgery | Admitting: General Surgery

## 2022-08-13 DIAGNOSIS — I129 Hypertensive chronic kidney disease with stage 1 through stage 4 chronic kidney disease, or unspecified chronic kidney disease: Secondary | ICD-10-CM | POA: Diagnosis not present

## 2022-08-13 DIAGNOSIS — S81801A Unspecified open wound, right lower leg, initial encounter: Secondary | ICD-10-CM | POA: Diagnosis not present

## 2022-08-13 DIAGNOSIS — I872 Venous insufficiency (chronic) (peripheral): Secondary | ICD-10-CM | POA: Insufficient documentation

## 2022-08-13 DIAGNOSIS — L89512 Pressure ulcer of right ankle, stage 2: Secondary | ICD-10-CM | POA: Insufficient documentation

## 2022-08-13 DIAGNOSIS — M86179 Other acute osteomyelitis, unspecified ankle and foot: Secondary | ICD-10-CM | POA: Insufficient documentation

## 2022-08-13 DIAGNOSIS — N183 Chronic kidney disease, stage 3 unspecified: Secondary | ICD-10-CM | POA: Diagnosis not present

## 2022-08-13 DIAGNOSIS — L97312 Non-pressure chronic ulcer of right ankle with fat layer exposed: Secondary | ICD-10-CM | POA: Diagnosis not present

## 2022-08-13 DIAGNOSIS — M1990 Unspecified osteoarthritis, unspecified site: Secondary | ICD-10-CM | POA: Diagnosis not present

## 2022-08-13 DIAGNOSIS — L97822 Non-pressure chronic ulcer of other part of left lower leg with fat layer exposed: Secondary | ICD-10-CM | POA: Diagnosis not present

## 2022-08-13 DIAGNOSIS — I890 Lymphedema, not elsewhere classified: Secondary | ICD-10-CM | POA: Diagnosis not present

## 2022-08-13 DIAGNOSIS — L97812 Non-pressure chronic ulcer of other part of right lower leg with fat layer exposed: Secondary | ICD-10-CM | POA: Diagnosis not present

## 2022-08-13 DIAGNOSIS — S81802A Unspecified open wound, left lower leg, initial encounter: Secondary | ICD-10-CM | POA: Diagnosis not present

## 2022-08-13 NOTE — Progress Notes (Signed)
RUFORD, DUDZINSKI ([id number]) Visit Report for 08/13/2022 Arrival Information Details Patient Name: [name]: NOVAH, GOZA [name] 08/13/2022 1:00 PM Medical Record Number: [id number] Patient Account Number: [id number] Date of Birth/Sex: Treating RN: [birth year] (81 y.o. Waldron Session Primary Care Suni Jarnagin: [name] Other Clinician: Referring Marcelis Wissner: Treating Sabin Gibeault/Extender: Benedict Needy Weeks in Treatment: 4 Visit Information History Since Last Visit All ordered tests and consults were completed: Yes Patient Arrived: [name] Added or deleted any medications: No Arrival Time: 13:16 Any new allergies or adverse reactions: No Accompanied By: self Had a fall or experienced change in No Transfer Assistance: None activities of daily living that may affect Patient Requires Transmission-Based Precautions: No risk of falls: Patient Has Alerts: Yes Signs or symptoms of abuse/neglect since last visito No Patient Alerts: ABI LLE 1.16, RLE 1.14 Hospitalized since last visit: No Implantable device outside of the clinic excluding No cellular tissue based products placed in the center since last visit: Has Dressing in Place as Prescribed: Yes Has Compression in Place as Prescribed: Yes Pain Present Now: Yes Electronic Signature(s) Signed: 08/13/2022 5:03:36 PM By: Blanche East RN Entered By: Blanche East on 08/13/2022 13:21:39 -------------------------------------------------------------------------------- Compression Therapy Details [name] 08/13/2022 1:00 PM Medical Record Number: [id number] Patient Account Number: [id number] Date of Birth/Sex: Treating RN: 1941/02/17 (81 y.o. Waldron Session Primary Care Uva Runkel: [name] Other Clinician: Referring Nyomi Howser: Treating Satia Winger/Extender: Benedict Needy Weeks in Treatment: 4 Compression Therapy Performed for Wound Assessment:  Wound #10 Left,Anterior Lower Leg Performed By: Clinician Blanche East, RN Compression Type: Three Layer Post Procedure Diagnosis Same as Pre-procedure Electronic Signature(s) Signed: 08/13/2022 5:03:36 PM By: Blanche East RN Entered By: Blanche East on 08/13/2022 13:54:09 -------------------------------------------------------------------------------- Compression Therapy Details [name] 08/13/2022 1:00 PM Medical Record Number: [id number] Patient Account Number: [id number] Date of Birth/Sex: Treating RN: 23-Feb-1941 (81 y.o. Waldron Session Primary Care Anderson Coppock: [name] Other Clinician: Referring Valarie Farace: Treating Vergia Chea/Extender: Benedict Needy Weeks in Treatment: 4 Compression Therapy Performed for Wound Assessment: Wound #9 Right,Anterior Lower Leg Performed By: Clinician Blanche East, RN Compression Type: Three Layer Post Procedure Diagnosis Same as Pre-procedure Electronic Signature(s) Signed: 08/13/2022 5:03:36 PM By: Blanche East RN Entered By: Blanche East on 08/13/2022 13:54:43 -------------------------------------------------------------------------------- Lower Extremity Assessment Details [name] 08/13/2022 1:00 PM Medical Record Number: [id number] Patient Account Number: [id number] Date of Birth/Sex: Treating RN: January 23, 1941 (81 y.o. Waldron Session Primary Care Aleiah Mohammed: [name] Other Clinician: Referring Cassian Torelli: Treating Micajah Dennin/Extender: Benedict Needy Weeks in Treatment: 4 Edema Assessment Assessed: [Left: No] [Right: No] E[Left: dema] [Right: :] Calf Left: Right: Point of Measurement: From Medial Instep 36.5 cm 41 cm Ankle Left: Right: Point of Measurement: From Medial Instep 24.5 cm 25 cm Vascular Assessment Pulses: Dorsalis Pedis Palpable: [Left:Yes] [Right:Yes] Electronic Signature(s) Signed:  08/13/2022 5:03:36 PM By: Blanche East RN Entered By: Blanche East on 08/13/2022 13:27:30 -------------------------------------------------------------------------------- Multi Wound Chart Details [name] 08/13/2022 1:00 PM Medical Record Number: [id number] Patient Account Number: [id number] Date of Birth/Sex: Treating RN: 1941-07-27 (81 y.o. M) Primary Care Geroge Gilliam: [name] Other Clinician: Referring Yareliz Thorstenson: Treating Geniyah Eischeid/Extender: Benedict Needy Weeks in Treatment: 4 Vital Signs Height(in): 68 Pulse(bpm): 91 Weight(lbs): 245 Blood Pressure(mmHg): 142/76 Body Mass Index(BMI): 37.2 Temperature(F): 98.4 Respiratory Rate(breaths/min): 20  Photos: Left, Anterior Lower Leg Right, Anterior Ankle Right, Anterior Lower Leg Wound Location: Skin T ear/Laceration Footwear Injury Trauma Wounding Event: Trauma, Other Venous Leg Ulcer Trauma, Other Primary Etiology: Cataracts, Chronic sinus Cataracts, Chronic sinus Cataracts, Chronic sinus Comorbid History: problems/congestion, Asthma, Sleep problems/congestion, Asthma, Sleep problems/congestion, Asthma, Sleep Apnea, Hypertension, Peripheral Apnea, Hypertension, Peripheral Apnea, Hypertension, Peripheral Venous Disease, Osteoarthritis Venous Disease, Osteoarthritis Venous Disease, Osteoarthritis 08/09/2022 07/03/2022 07/24/2022 Date Acquired: 0 4 2 Weeks of Treatment: Open Open Open Wound Status: No No No Wound Recurrence: 4x1x0.1 0.7x0.5x0.1 2x0.5x0.1 Measurements L x W x D (cm) 3.142 0.275 0.785 A (cm) : rea 0.314 0.027 0.079 Volume (cm) : N/A 80.60% 66.70% % Reduction in A rea: N/A 80.90% 66.50% % Reduction in Volume: Full Thickness Without Exposed Full Thickness Without Exposed Full Thickness Without Exposed Classification: Support Structures Support Structures Support Structures Medium Medium N/A Exudate A mount: Serosanguineous  Serosanguineous N/A Exudate Type: red, brown red, brown N/A Exudate Color: Flat and Intact Flat and Intact Flat and Intact Wound Margin: Medium (34-66%) Medium (34-66%) Large (67-100%) Granulation A mount: Pink Red, Pink Pink Granulation Quality: Medium (34-66%) Medium (34-66%) Small (1-33%) Necrotic A mount: Eschar, Adherent Slough Eschar, Adherent Slough Adherent Slough Necrotic Tissue: Fat Layer (Subcutaneous Tissue): Yes Fat Layer (Subcutaneous Tissue): Yes Fat Layer (Subcutaneous Tissue): Yes Exposed Structures: Fascia: No Fascia: No Fascia: No Tendon: No Tendon: No Tendon: No Muscle: No Muscle: No Muscle: No Joint: No Joint: No Joint: No Bone: No Bone: No N/A Small (1-33%) Medium (34-66%) Epithelialization: Debridement - Selective/Open Wound Debridement - Selective/Open Wound Debridement - Selective/Open Wound Debridement: Pre-procedure Verification/Time Out 13:48 13:48 13:48 Taken: Lidocaine 5% topical ointment Lidocaine 5% topical ointment Lidocaine 5% topical ointment Pain Control: Necrotic/Eschar Necrotic/Eschar, Psychologist, prison and probation services, Slough Tissue Debrided: Non-Viable Tissue Non-Viable Tissue Non-Viable Tissue Level: 4 0.35 1 Debridement A (sq cm): rea Curette Curette Curette Instrument: Minimum Minimum Minimum Bleeding: Pressure Pressure Pressure Hemostasis A chieved: 0 0 0 Procedural Pain: 0 0 0 Post Procedural Pain: Procedure was tolerated well Procedure was tolerated well Procedure was tolerated well Debridement Treatment Response: 4x1x0.1 0.7x0.5x0.1 2x0.5x0.1 Post Debridement Measurements L x W x D (cm) 0.314 0.027 0.079 Post Debridement Volume: (cm) Excoriation: Yes Excoriation: Yes Excoriation: No Periwound Skin Texture: Dry/Scaly: Yes Maceration: Yes Dry/Scaly: Yes Periwound Skin Moisture: Dry/Scaly: Yes Maceration: No Cool/Cold N/A Cool/Cold Temperature: Compression Therapy Debridement Compression Therapy Procedures  Performed: Debridement Debridement Treatment Notes Electronic Signature(s) Signed: 08/13/2022 2:28:59 PM By: Fredirick Maudlin MD FACS Entered By: Fredirick Maudlin on 08/13/2022 14:28:59 -------------------------------------------------------------------------------- Multi-Disciplinary Care Plan Details [name] 08/13/2022 1:00 PM Medical Record Number: [id number] Patient Account Number: [id number] Date of Birth/Sex: Treating RN: January 21, 1941 (81 y.o. Waldron Session Primary Care Amber Williard: [name] Other Clinician: Referring Gwendolen Hewlett: Treating Reyli Schroth/Extender: Benedict Needy Weeks in Treatment: 4 Active Inactive Venous Leg Ulcer Nursing Diagnoses: Potential for venous Insuffiency (use before diagnosis confirmed) Goals: Patient will maintain optimal edema control Date Initiated: 07/10/2022 Target Resolution Date: 09/04/2022 Goal Status: Active Interventions: Assess peripheral edema status every visit. Treatment Activities: Therapeutic compression applied : 07/10/2022 Notes: Wound/Skin Impairment Nursing Diagnoses: Impaired tissue integrity Goals: Ulcer/skin breakdown will have a volume reduction of 30% by week 4 Date Initiated: 07/10/2022 Date Inactivated: 08/13/2022 Target Resolution Date: 08/13/2022 Goal Status: Met Ulcer/skin breakdown will have a volume reduction of 50% by week 8 Date Initiated: 08/13/2022 Target Resolution Date: 09/17/2022 Goal Status: Active Interventions: Provide education on ulcer and skin care  Treatment Activities: Skin care regimen initiated : 07/10/2022 Topical wound management initiated : 07/10/2022 Notes: Electronic Signature(s) Signed: 08/13/2022 5:03:36 PM By: Blanche East RN Entered By: Blanche East on 08/13/2022 14:15:35 -------------------------------------------------------------------------------- Pain Assessment Details Patient Name: [name]: [name]  [name] 08/13/2022 1:00 PM Medical Record Number: [id number] Patient Account Number: [id number] Date of Birth/Sex: Treating RN: [birth year] (81 y.o. Waldron Session Primary Care Tyquarius Paglia: [name] Other Clinician: Referring Garcia Dalzell: Treating Kahlani Graber/Extender: Benedict Needy Weeks in Treatment: 4 Active Problems Location of Pain Severity and Description of Pain Patient Has Paino Yes Site Locations Rate the pain. Current Pain Level: 5 Character of Pain Describe the Pain: Aching Pain Management and Medication Current Pain Management: Electronic Signature(s) Signed: 08/13/2022 5:03:36 PM By: Blanche East RN Entered By: Blanche East on 08/13/2022 13:24:10 -------------------------------------------------------------------------------- Patient/Caregiver Education Details Patient Name: [name]: [name] RD Viona Gilmore 10/4/2023andnbsp1:00 PM Medical Record Number: [id number] Patient Account Number: [id number] Date of Birth/Gender: Treating RN: Jun 12, 1941 (81 y.o. Waldron Session Primary Care Physician: [name] Other Clinician: Referring Physician: Treating Physician/Extender: Benedict Needy Weeks in Treatment: 4 Education Assessment Education Provided To: Patient Education Topics Provided Wound/Skin Impairment: Methods: Explain/Verbal Responses: Reinforcements needed, State content correctly Electronic Signature(s) Signed: 08/13/2022 5:03:36 PM By: Blanche East RN Entered By: Blanche East on 08/13/2022 14:15:48 -------------------------------------------------------------------------------- Wound Assessment Details [name] 08/13/2022 1:00 PM Medical Record Number: [id number] Patient Account Number: [id number] Date of Birth/Sex: Treating RN: 14-Aug-1941 (81 y.o. Waldron Session Primary Care Reesha Debes: [name] Other Clinician: Referring Raeford Brandenburg: Treating  Lasya Vetter/Extender: Benedict Needy Weeks in Treatment: 4 Wound Status Wound Number: 10 Primary Trauma, Other Etiology: Wound Location: Left, Anterior Lower Leg Wound Open Wounding Event: Skin Tear/Laceration Status: Date Acquired: 08/09/2022 Comorbid Cataracts, Chronic sinus problems/congestion, Asthma, Sleep Weeks Of Treatment: 0 History: Apnea, Hypertension, Peripheral Venous Disease, Osteoarthritis Clustered Wound: No Photos Wound Measurements Length: (cm) 4 Width: (cm) 1 Depth: (cm) 0.1 Area: (cm) 3.142 Volume: (cm) 0.314 % Reduction in Area: % Reduction in Volume: Tunneling: No Undermining: No Wound Description Classification: Full Thickness Without Exposed Support Structures Wound Margin: Flat and Intact Exudate Amount: Medium Exudate Type: Serosanguineous Exudate Color: red, brown Foul Odor After Cleansing: No Slough/Fibrino Yes Wound Bed Granulation Amount: Medium (34-66%) Exposed Structure Granulation Quality: Pink Fascia Exposed: No Necrotic Amount: Medium (34-66%) Fat Layer (Subcutaneous Tissue) Exposed: Yes Necrotic Quality: Eschar, Adherent Slough Tendon Exposed: No Muscle Exposed: No Joint Exposed: No Periwound Skin Texture Texture Color No Abnormalities Noted: No No Abnormalities Noted: No Excoriation: Yes Temperature / Pain Temperature: Cool/Cold Moisture No Abnormalities Noted: No Dry / Scaly: Yes Electronic Signature(s) Signed: 08/13/2022 5:03:36 PM By: Blanche East RN Entered By: Blanche East on 08/13/2022 13:39:16 -------------------------------------------------------------------------------- Wound Assessment Details [name] 08/13/2022 1:00 PM Medical Record Number: [id number] Patient Account Number: [id number] Date of Birth/Sex: Treating RN: 10/02/1941 (81 y.o. Waldron Session Primary Care Ruffus Kamaka: [name] Other Clinician: Referring Earnestine Shipp: Treating  Blayre Papania/Extender: Benedict Needy Weeks in Treatment: 4 Wound Status Wound Number: 7 Primary Venous Leg Ulcer Etiology: Wound Location: Right, Anterior Ankle Wound Open Wounding Event: Footwear Injury Status: Date Acquired: 07/03/2022 Comorbid Cataracts, Chronic sinus problems/congestion, Asthma, Sleep Weeks Of Treatment: 4 History: Apnea, Hypertension, Peripheral Venous Disease, Osteoarthritis Clustered Wound: No Photos Wound Measurements Length: (cm) 0.7 Width: (cm) 0.5 Depth: (cm) 0.1 Area: (cm) 0.275  Volume: (cm) 0.027 % Reduction in Area: 80.6% % Reduction in Volume: 80.9% Epithelialization: Small (1-33%) Tunneling: No Undermining: No Wound Description Classification: Full Thickness Without Exposed Support Structures Wound Margin: Flat and Intact Exudate Amount: Medium Exudate Type: Serosanguineous Exudate Color: red, brown Foul Odor After Cleansing: No Slough/Fibrino Yes Wound Bed Granulation Amount: Medium (34-66%) Exposed Structure Granulation Quality: Red, Pink Fascia Exposed: No Necrotic Amount: Medium (34-66%) Fat Layer (Subcutaneous Tissue) Exposed: Yes Necrotic Quality: Eschar, Adherent Slough Tendon Exposed: No Muscle Exposed: No Joint Exposed: No Bone Exposed: No Periwound Skin Texture Texture Color No Abnormalities Noted: No No Abnormalities Noted: No Excoriation: Yes Moisture No Abnormalities Noted: No Dry / Scaly: Yes Maceration: Yes Electronic Signature(s) Signed: 08/13/2022 5:03:36 PM By: Blanche East RN Entered By: Blanche East on 08/13/2022 13:40:21 -------------------------------------------------------------------------------- Wound Assessment Details [name] 08/13/2022 1:00 PM Medical Record Number: [id number] Patient Account Number: [id number] Date of Birth/Sex: Treating RN: [birth year] (81 y.o. Waldron Session Primary Care Hanad Leino: [name] Other  Clinician: Referring Chrystina Naff: Treating Lenward Able/Extender: Benedict Needy Weeks in Treatment: 4 Wound Status Wound Number: 9 Primary Trauma, Other Etiology: Wound Location: Right, Anterior Lower Leg Wound Open Wounding Event: Trauma Status: Date Acquired: 07/24/2022 Comorbid Cataracts, Chronic sinus problems/congestion, Asthma, Sleep Weeks Of Treatment: 2 History: Apnea, Hypertension, Peripheral Venous Disease, Osteoarthritis Clustered Wound: No Photos Wound Measurements Length: (cm) 2 Width: (cm) 0.5 Depth: (cm) 0.1 Area: (cm) 0.785 Volume: (cm) 0.079 % Reduction in Area: 66.7% % Reduction in Volume: 66.5% Epithelialization: Medium (34-66%) Tunneling: No Undermining: No Wound Description Classification: Full Thickness Without Exposed Support Structures Wound Margin: Flat and Intact Foul Odor After Cleansing: No Slough/Fibrino Yes Wound Bed Granulation Amount: Large (67-100%) Exposed Structure Granulation Quality: Pink Fascia Exposed: No Necrotic Amount: Small (1-33%) Fat Layer (Subcutaneous Tissue) Exposed: Yes Necrotic Quality: Adherent Slough Tendon Exposed: No Muscle Exposed: No Joint Exposed: No Bone Exposed: No Periwound Skin Texture Texture Color No Abnormalities Noted: No No Abnormalities Noted: No Excoriation: No Temperature / Pain Temperature: Cool/Cold Moisture No Abnormalities Noted: No Dry / Scaly: Yes Maceration: No Electronic Signature(s) Signed: 08/13/2022 5:03:36 PM By: Blanche East RN Entered By: Blanche East on 08/13/2022 13:39:52 -------------------------------------------------------------------------------- Vitals Details [name] 08/13/2022 1:00 PM Medical Record Number: [id number] Patient Account Number: [id number] Date of Birth/Sex: Treating RN: 07-31-1941 (81 y.o. Waldron Session Primary Care Jamyah Folk: [name] Other Clinician: Referring  Dicy Smigel: Treating Letita Prentiss/Extender: Benedict Needy Weeks in Treatment: 4 Vital Signs Time Taken: 13:21 Temperature (F): 98.4 Height (in): 68 Pulse (bpm): 91 Weight (lbs): 245 Respiratory Rate (breaths/min): 20 Body Mass Index (BMI): 37.2 Blood Pressure (mmHg): 142/76 Reference Range: 80 - 120 mg / dl Electronic Signature(s) Signed: 08/13/2022 5:03:36 PM By: Blanche East RN Entered By: Blanche East on 08/13/2022 13:23:45

## 2022-08-13 NOTE — Progress Notes (Signed)
[name], [name] ([id number]) Visit Report for 08/13/2022 Chief Complaint Document Details Patient Name: [name]: [name], Hanson [name] 08/13/2022 1:00 PM Medical Record Number: [id number] Patient Account Number: [id number] Date of Birth/Sex: Treating RN: [birth year] (81 y.o. 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 08/13/2022 1:00 PM Medical Record Number: [id number] Patient Account Number: [id number] Date of Birth/Sex: Treating RN: October 20, 1941 (81 y.o. Waldron Session Primary Care Provider: [name] Other Clinician: Referring Provider: Treating Provider/Extender: Benedict Needy Weeks in Treatment: 4 Debridement Performed for Assessment: Wound #9 Right,Anterior Lower Leg Performed By: Physician Fredirick Maudlin, MD Debridement Type: Debridement Level of Consciousness (Pre-procedure): Awake and Alert Pre-procedure Verification/Time Out Yes - 13:48 Taken: Start Time: 13:49 Pain Control: Lidocaine 5% topical ointment T Area Debrided (L x W): otal 2 (cm) x 0.5 (cm) = 1 (cm) Tissue and other material debrided: Eschar, Slough, Slough Level: Non-Viable Tissue Debridement  Description: Selective/Open Wound Instrument: Curette Bleeding: Minimum Hemostasis Achieved: Pressure Procedural Pain: 0 Post Procedural Pain: 0 Response to Treatment: Procedure was tolerated well Level of Consciousness (Post- Awake and Alert procedure): Post Debridement Measurements of Total Wound Length: (cm) 2 Width: (cm) 0.5 Depth: (cm) 0.1 Volume: (cm) 0.079 Character of Wound/Ulcer Post Debridement: Improved Post Procedure Diagnosis Same as Pre-procedure Notes Scribed for Dr. Celine Ahr by Blanche East, RN Electronic Signature(s) Signed: 08/13/2022 4:56:11 PM By: Fredirick Maudlin MD FACS Signed: 08/13/2022 5:03:36 PM By: Blanche East RN Entered By: Blanche East on 08/13/2022 13:51:41 -------------------------------------------------------------------------------- Debridement Details Patient Name: [name]: [name] [name] 08/13/2022 1:00 PM Medical Record Number: [id number] Patient Account Number: [id number] Date of Birth/Sex: Treating RN: [birth year] (81 y.o. Waldron Session Primary Care Provider: [name] Other Clinician: Referring Provider: Treating Provider/Extender: Benedict Needy Weeks in Treatment: 4 Debridement Performed for Assessment: Wound #7 Right,Anterior Ankle Performed By: Physician Fredirick Maudlin, MD Debridement Type: Debridement Severity of Tissue Pre Debridement: Fat layer exposed Level of Consciousness (Pre-procedure): Awake and Alert Pre-procedure Verification/Time Out Yes - 13:48 Taken: Start Time: 13:49 Pain Control: Lidocaine 5% topical ointment T Area Debrided (L x W): otal 0.7 (cm) x 0.5 (cm) = 0.35 (cm) Tissue and other material debrided: Eschar, Slough, Slough Level: Non-Viable Tissue Debridement Description: Selective/Open Wound Instrument: Curette Bleeding: Minimum Hemostasis Achieved: Pressure Procedural Pain: 0 Post Procedural Pain: 0 Response to Treatment: Procedure was tolerated  well Level of Consciousness (Post- Awake and Alert procedure): Post Debridement Measurements of Total Wound Length: (cm) 0.7 Width: (cm) 0.5 Depth: (cm) 0.1 Volume: (cm) 0.027 Character of Wound/Ulcer Post Debridement: Improved Severity of Tissue Post Debridement: Fat layer exposed Post Procedure Diagnosis Same as Pre-procedure Notes Scribed for Dr. Celine Ahr by Blanche East, RN Electronic Signature(s) Signed: 08/13/2022 4:56:11 PM By: Fredirick Maudlin MD FACS Signed: 08/13/2022 5:03:36 PM By: Blanche East RN Entered By: Blanche East on 08/13/2022 13:52:12 -------------------------------------------------------------------------------- Debridement  Details Patient Name: [name]: TAB, [name] [name] 08/13/2022 1:00 PM Medical Record Number: [id number] Patient Account Number: [id number] Date of Birth/Sex: Treating RN: September 01, 1941 (81 y.o. Waldron Session Primary Care Provider: [name] Other Clinician: Referring Provider: Treating Provider/Extender: Benedict Needy Weeks in Treatment: 4 Debridement Performed for Assessment: Wound #10 Left,Anterior Lower Leg Performed By: Physician Fredirick Maudlin, MD Debridement Type: Debridement Level of Consciousness (Pre-procedure): Awake and Alert Pre-procedure Verification/Time Out Yes - 13:48 Taken: Start Time: 13:49 Pain Control: Lidocaine 5% topical ointment T Area Debrided (L x W): otal 4 (cm) x 1 (cm) = 4 (cm) Tissue and other material debrided: Non-Viable, Eschar Level: Non-Viable Tissue Debridement Description: Selective/Open Wound Instrument: Curette Bleeding: Minimum Hemostasis Achieved: Pressure Procedural Pain: 0 Post Procedural Pain: 0 Response to Treatment: Procedure was tolerated well Level of Consciousness (Post- Awake and Alert procedure): Post Debridement Measurements of Total Wound Length: (cm) 4 Width: (cm) 1 Depth: (cm) 0.1 Volume: (cm) 0.314 Character of Wound/Ulcer Post  Debridement: Improved Post Procedure Diagnosis Same as Pre-procedure Notes Scribed for Dr. Celine Ahr by Blanche East, RN Electronic Signature(s) Signed: 08/13/2022 4:56:11 PM By: Fredirick Maudlin MD FACS Signed: 08/13/2022 5:03:36 PM By: Blanche East RN Entered By: Blanche East on 08/13/2022 13:53:49 -------------------------------------------------------------------------------- HPI Details Patient Name: [name]: [name] [name] 08/13/2022 1:00 PM Medical Record Number: [id number] Patient Account Number: [id number] Date of Birth/Sex: Treating RN: 03-22-1941 (81 y.o. M) Primary Care Provider: [name] Other Clinician: Referring Provider: Treating Provider/Extender: Benedict Needy Weeks in Treatment: 4 History of Present Illness HPI Description: ADMISSION 02/07/2021 This is a 81 year old man who is not a diabetic "prediabetic" who comes in today letter asking [place] to look at an area on his right first toe tip that has been there about a year. He has been getting this dressed that [employer] and he had Xeroform on this with gauze. The patient is not exactly sure how this came about but he is not able to dress the wound himself because of limitations due to what sounds like spinal stenosis and pain. He had a wound on the ankle as well but that healed over. He was seen by vascular in May 2021. This was related to a right great toe wound. He had an angiogram that was completely normal on both sides good perfusion right into the feet. He also looks like he has lymphedema and he has compression stockings but he cannot get them on and off so he essentially leaves he is on even when he showering Past medical history includes prediabetes, low back pain secondary I think the lumbar spinal stenosis has been offered a decompressive laminectomy but he has not gone forth with this. 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 ABI was noncompressible in our clinic on the right right first toe. 4/7; patient I admitted the clinic last week. He has an area on the tip of his right first toe. We cleaned this off last week and have been using silver collagen. He is in the same crocs today that he wore last week and I told him not to use. For 1 reason or another he could not handle a surgical sandal. 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 He is not a diabetic 4/12; right first toe wound chronic. We have been using silver collagen. Once again he comes in  then the same pair of old crocs that he comes in every week. 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 His x-ray suggested suspicious for osteomyelitis with bone irregularity at the tuft of the right great toe 4/21; patient has been using silver collagen every other day to the wound on his right great toe. He continues to wear crocs. He has no complaints or issues today. 5/5; 2-week follow-up. 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 Mild soft tissue edema with enhancement at the distal aspect of the great toe suggestive of cellulitis. Fortunately the wound actually looks better. Has been using Hydrofera Blue 5/12; started him on Augmentin last week 875 twice daily he is tolerating this well. Last creatinine I see is 1.77 in early April. We are using Hydrofera Blue to the wound which actually looks better today. 5/17; tolerating the Augmentin well. He says the wound is actually been hurting him episodically. Actually the surface of this looks quite good. He did not get his lab work done. We are using [facility] he is changing this himself 6/2; still taking Augmentin he should be rounding into his last week. This was empirically for underlying osteomyelitis. The area on the tip of his toe is still open still with some depth  but no palpable bone. We have been using Hydrofera Blue. Surface area of the wound has not been making much improvement Is work was essentially unremarkable except for a creatinine of 1.79. Reason for his renal insufficiency is not clear he is not a diabetic does not have hypertension however he does take NSAIDs and Lasix I wonder if this is the issue here. Note that his creatinine on 11/12/2018 was 1.45. Platelet count slightly low at 143 however his inflammatory markers were really in the normal range at 10 and 2 sedimentation rate and C-reactive protein respectively. 6/9; he apparently had another 2 weeks of Augmentin after we look that this last time. Therefore he should be coming into his last week next week. We are using polymen on the tip of the toe. He cannot get down to do the dressing so he comes in for a nurse visit. He comes in in crocs I have warned him against this although he says he is using a different shoe for most of the day he works in [employer]. 6/16; he comes back in in crocs. I have spoken him about this before. His areas on the tip of his toe this actually looks quite good we have been using polymen. He has completed his antibiotics which we empirically gave him for underlying osteomyelitis. Everything looks better to me in spite of his noncompliance with footwear recommendation 7/11; patient has been followed through nurse visits. He was last seen by provider on 6/16. He reports improvement to the wound with the use of PolyMem. He denies signs of infection. 7/21; the patient arrives with the area on the tip of his right great toe completely healed. He has the same proximal on that I see every time. I treated him empirically with Augmentin for osteomyelitis. He needs to be vigilant about this. READMISSION 03/19/2022 The patient returns with a reopening of the right great toe wound. Apparently this occurred in around October of last year. He was followed by podiatry for some  time. They have performed x-rays but unfortunately, we do not have access to them or the results.  No report as to whether or not he might have osteomyelitis. In addition, he has a new wound on the medial aspect of the right great toe as well as the anterior tibial surface of his right lower leg. He does state that he was wearing compression stockings when this tibial wound occurred. He continues to wear crocs. 04/02/2022: For some reason, the patient has not been seen in 2 weeks. He says that he had to reschedule his visit. Therefore his Coflex compression wrap was left in situ for 14 days. It appears that his skin became macerated and he has opened up several new superficial wounds on his right anterior tibial surface. We did get an x-ray to evaluate for osteomyelitis and none was seen. All of the pre-existing wounds are smaller today and the new wounds look like they are limited to just the breakdown of skin. 04/10/2022: The wounds on his right anterior tibial surface are actually a little bit larger today. He seems to have some skin irritation and maceration in the area again. The tip of his toe is nearly closed as is the wound on the dorsal surface of his right great toe. READMISSION 07/10/2022 Since our last visit, the patient went to the operating room to have a left hip replacement, but in the preop holding area, his surgeon noted a foul odor coming from his right foot. He contacted me and described the findings of his right great toe and we mutually agreed, along with the patient, that amputation would be most appropriate. 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 Since his surgery, he has been wearing compression stockings bilaterally, but as he is unable to apply or remove them himself, he wears them for a week at a time. This appears to have resulted in a pressure injury on his right anterior ankle at the point of flexion; he has a  similar looking abrasion that has not broken the skin in the same site on the left. 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 07/21/2022: The right anterior ankle wound is nearly closed aside from a small open area that has some slough accumulation. The amputation site has crusting and eschar present. Once this was removed, it was revealed that the amputation site has healed. The abrasion on the left anterior ankle at the point of flexion is no longer present. 07/28/2022: The patient dropped a door on his leg and has a new wound to his right anterior tibial surface. There is some nonviable skin hanging from the cranial portion of the wound. The fat layer is exposed. The right anterior ankle wound is a little bit smaller and has some accumulated slough present. 08/04/2022: The wound on his right anterior tibial surface is a little bit smaller today. There is still slough accumulation. The right anterior ankle wound is quite a bit smaller today and also has slough buildup on the surface. 08/13/2022: The patient dropped another object that struck him in the left anterior tibial surface. This has resulted in wound at this location. It is fairly superficial but does involve the fat layer. The right anterior tibial surface wound has a little bit of eschar on the surface and underneath, it is quite small. The right anterior ankle wound has also contracted but does have slough and eschar buildup as well. Electronic Signature(s) Signed: 08/13/2022 2:31:08 PM By: Fredirick Maudlin MD FACS Entered By: Fredirick Maudlin on 08/13/2022  14:31:08 -------------------------------------------------------------------------------- Physical Exam Details Patient Name: [name]: [name], [name] [name] 08/13/2022 1:00 PM Medical Record Number: [id number] Patient Account Number: [id number] Date of Birth/Sex: Treating RN: 08-25-1941 (81 y.o. M) Primary Care Provider: [name] Other  Clinician: Referring Provider: Treating Provider/Extender: Benedict Needy Weeks in Treatment: 4 Constitutional Slightly hypertensive. . . . No acute distress.[name] Respiratory . Notes 08/13/2022: The patient dropped another object that struck him in the left anterior tibial surface. This has resulted in wound at this location. It is fairly superficial but does involve the fat layer. The right anterior tibial surface wound has a little bit of eschar on the surface and underneath, it is quite small. The right anterior ankle wound has also contracted but does have slough and eschar buildup as well. Electronic Signature(s) Signed: 08/13/2022 2:31:40 PM By: Fredirick Maudlin MD FACS Entered By: Fredirick Maudlin on 08/13/2022 14:31:40 -------------------------------------------------------------------------------- Physician Orders Details Patient Name: [name]: [name] [name] 08/13/2022 1:00 PM Medical Record Number: [id number] Patient Account Number: [id number] Date of Birth/Sex: Treating RN: Jan 15, 1941 (81 y.o. 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 - Dr. Celine Ahr- Rm 4 Return A Monday Anesthetic Wound #7 Right,Anterior Ankle (In clinic) Topical Lidocaine 4% applied to wound bed - in clinic, prior to debridement Bathing/ Shower/ Hygiene May shower and wash wound with soap and  water. Edema Control - Lymphedema / SCD / Other Left Lower Extremity Elevate legs to the level of the heart or above for 30 minutes daily and/or when sitting, a frequency of: - throughout the day Avoid standing for long periods of time. Exercise regularly Moisturize legs daily. - both legs nightly Wound Treatment Wound #10 - Lower Leg Wound Laterality: Left, Anterior Cleanser: Soap and Water 1 x Per Week Discharge Instructions: May shower and wash wound with dial antibacterial soap and water prior to dressing change. Prim Dressing: KerraCel Ag Gelling Fiber Dressing, 2x2 in (silver alginate) 1 x Per Week ary Discharge Instructions: Apply silver alginate to wound bed as instructed Secondary Dressing: ALLEVYN Gentle Border, 4x4 (in/in) 1 x Per Week Discharge Instructions: Apply over primary dressing as directed. Compression Wrap: ThreePress (3 layer compression wrap) 1 x Per Week Discharge Instructions: Apply three layer compression as directed. Wound #7 - Ankle Wound Laterality: Right, Anterior Cleanser: Soap and Water 1 x Per Week Discharge Instructions: May shower and wash wound with dial antibacterial soap and water prior to dressing change. Prim Dressing: KerraCel Ag Gelling Fiber Dressing, 2x2 in (silver alginate) 1 x Per Week ary Discharge Instructions: Apply silver alginate to wound bed as instructed Secondary Dressing: ALLEVYN Gentle Border, 4x4 (in/in) 1 x Per Week Discharge Instructions: Apply over primary dressing as directed. Compression Wrap: ThreePress (3 layer compression wrap) 1 x Per Week Discharge Instructions: Apply three layer compression as directed. Wound #9 - Lower Leg Wound Laterality: Right, Anterior Cleanser: Soap and Water 1 x Per Week Discharge Instructions: May shower and wash wound with dial antibacterial soap and water prior  to dressing change. Prim Dressing: KerraCel Ag Gelling Fiber Dressing, 2x2 in (silver alginate) 1 x Per Week ary Discharge  Instructions: Apply silver alginate to wound bed as instructed Secondary Dressing: ALLEVYN Gentle Border, 4x4 (in/in) 1 x Per Week Discharge Instructions: Apply over primary dressing as directed. Compression Wrap: ThreePress (3 layer compression wrap) 1 x Per Week Discharge Instructions: Apply three layer compression as directed. Electronic Signature(s) Signed: 08/13/2022 4:56:11 PM By: Fredirick Maudlin MD FACS Entered By: Fredirick Maudlin on 08/13/2022 14:32:57 -------------------------------------------------------------------------------- Problem List Details Patient Name: [name]: [name] [name] 08/13/2022 1:00 PM Medical Record Number: [id number] Patient Account Number: [id number] Date of Birth/Sex: Treating RN: 1940-11-28 (81 y.o. M) Primary Care Provider: [name] Other Clinician: Referring Provider: Treating Provider/Extender: Benedict Needy Weeks in Treatment: 4 Active Problems ICD-10 Encounter Code Description Active Date MDM Diagnosis L89.512 Pressure ulcer of right ankle, stage 2 07/10/2022 No Yes N18.30 Chronic kidney disease, stage 3 unspecified 07/10/2022 No Yes I87.2 Venous insufficiency (chronic) (peripheral) 07/10/2022 No Yes L97.812 Non-pressure chronic ulcer of other part of right lower leg with fat layer 07/28/2022 No Yes exposed L97.822 Non-pressure chronic ulcer of other part of left lower leg with fat layer exposed10/02/2022 No Yes Inactive Problems ICD-10 Code Description Active Date Inactive Date L97.512 Non-pressure chronic ulcer of other part of right foot with fat layer exposed 07/10/2022 07/10/2022 T81.31XS Disruption of external operation (surgical) wound, not elsewhere classified, sequela 07/10/2022 07/10/2022 Resolved Problems Electronic Signature(s) Signed: 08/13/2022 2:28:48 PM By: Fredirick Maudlin MD FACS Entered By: Fredirick Maudlin on 08/13/2022  14:28:48 -------------------------------------------------------------------------------- Progress Note Details Patient Name: [name]: [name] [name] 08/13/2022 1:00 PM Medical Record Number: [id number] Patient Account Number: [id number] Date of Birth/Sex: Treating RN: 10-Dec-1940 (81 y.o. 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 He has been getting this dressed that [employer] and he had Xeroform on this with gauze. The patient is not exactly sure how this came about but he is not able to dress the wound himself because of limitations due to what sounds like spinal stenosis and pain. He had a wound on the ankle as well but that healed over. He was seen by vascular in May 2021. This was related to a right great toe wound. He had an angiogram that was completely normal on both sides good perfusion right into the feet. He also looks like he has lymphedema and he has compression stockings but he cannot get them on and off so he essentially leaves he is on even when he showering Past medical history includes prediabetes, low back pain secondary I think the lumbar spinal stenosis has been offered a decompressive laminectomy but he has not gone forth with  this. 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 ABI was noncompressible in our clinic on the right right first toe. 4/7; patient I admitted the clinic last week. He has an area on the tip of his right first toe. We cleaned this off last week and have been using silver collagen. He is in the same crocs today that he wore last week and I told him not to use. For 1 reason or another he could not handle a surgical sandal. 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 He is not a diabetic 4/12; right first toe wound chronic. We have been using silver collagen. Once again he comes in then the same pair of old crocs that he comes in every week. 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 His x-ray suggested suspicious for osteomyelitis with bone irregularity at the tuft of the right great toe 4/21; patient has been using silver collagen every other day to the wound on his right great toe. He continues to wear crocs. He has no complaints or issues today. 5/5; 2-week follow-up. 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 Mild soft tissue edema with enhancement at the distal aspect of the great toe suggestive of cellulitis. Fortunately the wound actually looks better. Has been using Hydrofera Blue 5/12; started him on Augmentin last week 875 twice daily he is tolerating this well. Last creatinine I see is 1.77 in early April. We are using Hydrofera Blue to the wound which actually looks better today. 5/17; tolerating the Augmentin well. He says the wound is actually been hurting him episodically. Actually the surface of this looks quite good. He did not get his lab work done. We are using [facility] he is changing this himself 6/2; still taking Augmentin he should be rounding into his last week. This was empirically  for underlying osteomyelitis. The area on the tip of his toe is still open still with some depth but no palpable bone. We have been using Hydrofera Blue. Surface area of the wound has not been making much improvement Is work was essentially unremarkable except for a creatinine of 1.79. Reason for his renal insufficiency is not clear he is not a diabetic does not have hypertension however he does take NSAIDs and Lasix I wonder if this is the issue here. Note that his creatinine on 11/12/2018 was 1.45. Platelet count slightly low at 143 however his inflammatory markers were really in the normal range at 10 and 2 sedimentation rate and C-reactive protein respectively. 6/9; he apparently had another 2 weeks of Augmentin after we look that this last time. Therefore he should be coming into his last week next week. We are using polymen on the tip of the toe. He cannot get down to do the dressing so he comes in for a nurse visit. He comes in in crocs I have warned him against this although he says he is using a different shoe for most of the day he works in [employer]. 6/16; he comes back in in crocs. I have spoken him about this before. His areas on the tip of his toe this actually looks quite good we have been using polymen. He has completed his antibiotics which we empirically gave him for underlying osteomyelitis. Everything looks better to me in spite of his noncompliance with footwear recommendation 7/11; patient has been followed through  nurse visits. He was last seen by provider on 6/16. He reports improvement to the wound with the use of PolyMem. He denies signs of infection. 7/21; the patient arrives with the area on the tip of his right great toe completely healed. He has the same proximal on that I see every time. I treated him empirically with Augmentin for osteomyelitis. He needs to be vigilant about this. READMISSION 03/19/2022 The patient returns with a reopening of the right great toe  wound. Apparently this occurred in around October of last year. He was followed by podiatry for some time. They have performed x-rays but unfortunately, we do not have access to them or the results. No report as to whether or not he might have osteomyelitis. In addition, he has a new wound on the medial aspect of the right great toe as well as the anterior tibial surface of his right lower leg. He does state that he was wearing compression stockings when this tibial wound occurred. He continues to wear crocs. 04/02/2022: For some reason, the patient has not been seen in 2 weeks. He says that he had to reschedule his visit. Therefore his Coflex compression wrap was left in situ for 14 days. It appears that his skin became macerated and he has opened up several new superficial wounds on his right anterior tibial surface. We did get an x-ray to evaluate for osteomyelitis and none was seen. All of the pre-existing wounds are smaller today and the new wounds look like they are limited to just the breakdown of skin. 04/10/2022: The wounds on his right anterior tibial surface are actually a little bit larger today. He seems to have some skin irritation and maceration in the area again. The tip of his toe is nearly closed as is the wound on the dorsal surface of his right great toe. READMISSION 07/10/2022 Since our last visit, the patient went to the operating room to have a left hip replacement, but in the preop holding area, his surgeon noted a foul odor coming from his right foot. He contacted me and described the findings of his right great toe and we mutually agreed, along with the patient, that amputation would be most appropriate. 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 Since his surgery, he has been wearing compression stockings bilaterally, but as he is unable to apply or remove them himself, he wears them for a week at a time. This appears to  have resulted in a pressure injury on his right anterior ankle at the point of flexion; he has a similar looking abrasion that has not broken the skin in the same site on the left. 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 07/21/2022: The right anterior ankle wound is nearly closed aside from a small open area that has some slough accumulation. The amputation site has crusting and eschar present. Once this was removed, it was revealed that the amputation site has healed. The abrasion on the left anterior ankle at the point of flexion is no longer present. 07/28/2022: The patient dropped a door on his leg and has a new wound to his right anterior tibial surface. There is some nonviable skin hanging from the cranial portion of the wound. The fat layer is exposed. The right anterior ankle wound is a little bit smaller and has some accumulated slough present. 08/04/2022: The wound on his right anterior tibial surface is  a little bit smaller today. There is still slough accumulation. The right anterior ankle wound is quite a bit smaller today and also has slough buildup on the surface. 08/13/2022: The patient dropped another object that struck him in the left anterior tibial surface. This has resulted in wound at this location. It is fairly superficial but does involve the fat layer. The right anterior tibial surface wound has a little bit of eschar on the surface and underneath, it is quite small. The right anterior ankle wound has also contracted but does have slough and eschar buildup as well. Patient History Information obtained from Patient. Family History Diabetes - Maternal Grandparents, Heart Disease - Father, Hypertension - Father, Stroke - Father, Thyroid Problems - Mother,Child, No family history of Cancer, Hereditary Spherocytosis, Kidney Disease, Lung Disease, Seizures, Tuberculosis. Social History Never smoker, Marital Status - Single, Alcohol Use - Rarely,  Drug Use - No History, Caffeine Use - Daily - coffee. Medical History Eyes Patient has history of Cataracts - 2019 Ear/Nose/Mouth/Throat Patient has history of Chronic sinus problems/congestion Respiratory Patient has history of Asthma, Sleep Apnea Cardiovascular Patient has history of Hypertension, Peripheral Venous Disease Endocrine Denies history of Type II Diabetes Musculoskeletal Patient has history of Osteoarthritis Medical A Surgical History Notes nd Gastrointestinal GERD Genitourinary BPH Musculoskeletal Degenerative disc disease Neurologic Neuromuscular disorder Objective Constitutional Slightly hypertensive. No acute distress.. Vitals Time Taken: 1:21 PM, Height: 68 in, Weight: 245 lbs, BMI: 37.2, Temperature: 98.4 F, Pulse: 91 bpm, Respiratory Rate: 20 breaths/min, Blood Pressure: 142/76 mmHg. General Notes: 08/13/2022: The patient dropped another object that struck him in the left anterior tibial surface. This has resulted in wound at this location. It is fairly superficial but does involve the fat layer. The right anterior tibial surface wound has a little bit of eschar on the surface and underneath, it is quite small. The right anterior ankle wound has also contracted but does have slough and eschar buildup as well. Integumentary (Hair, Skin) Wound #10 status is Open. Original cause of wound was Skin T ear/Laceration. The date acquired was: 08/09/2022. The wound is located on the Left,Anterior Lower Leg. The wound measures 4cm length x 1cm width x 0.1cm depth; 3.142cm^2 area and 0.314cm^3 volume. There is Fat Layer (Subcutaneous Tissue) exposed. There is no tunneling or undermining noted. There is a medium amount of serosanguineous drainage noted. The wound margin is flat and intact. There is medium (34-66%) pink granulation within the wound bed. There is a medium (34-66%) amount of necrotic tissue within the wound bed including Eschar and Adherent Slough. The  periwound skin appearance exhibited: Excoriation, Dry/Scaly. Periwound temperature was noted as Cool/Cold. Wound #7 status is Open. Original cause of wound was Footwear Injury. The date acquired was: 07/03/2022. The wound has been in treatment 4 weeks. The wound is located on the Right,Anterior Ankle. The wound measures 0.7cm length x 0.5cm width x 0.1cm depth; 0.275cm^2 area and 0.027cm^3 volume. There is Fat Layer (Subcutaneous Tissue) exposed. There is no tunneling or undermining noted. There is a medium amount of serosanguineous drainage noted. The wound margin is flat and intact. There is medium (34-66%) red, pink granulation within the wound bed. There is a medium (34-66%) amount of necrotic tissue within the wound bed including Eschar and Adherent Slough. The periwound skin appearance exhibited: Excoriation, Dry/Scaly, Maceration. Wound #9 status is Open. Original cause of wound was Trauma. The date acquired was: 07/24/2022. The wound has been in treatment 2 weeks. The wound is located on the Hart  Lower Leg. The wound measures 2cm length x 0.5cm width x 0.1cm depth; 0.785cm^2 area and 0.079cm^3 volume. There is Fat Layer (Subcutaneous Tissue) exposed. There is no tunneling or undermining noted. The wound margin is flat and intact. There is large (67-100%) pink granulation within the wound bed. There is a small (1-33%) amount of necrotic tissue within the wound bed including Adherent Slough. The periwound skin appearance exhibited: Dry/Scaly. The periwound skin appearance did not exhibit: Excoriation, Maceration. Periwound temperature was noted as Cool/Cold. Assessment Active Problems ICD-10 Pressure ulcer of right ankle, stage 2 Chronic kidney disease, stage 3 unspecified Venous insufficiency (chronic) (peripheral) Non-pressure chronic ulcer of other part of right lower leg with fat layer exposed Non-pressure chronic ulcer of other part of left lower leg with fat layer  exposed Procedures Wound #10 Pre-procedure diagnosis of Wound #10 is a Trauma, Other located on the Left,Anterior Lower Leg . There was a Selective/Open Wound Non-Viable Tissue Debridement with a total area of 4 sq cm performed by Fredirick Maudlin, MD. With the following instrument(s): Curette to remove Non-Viable tissue/material. Material removed includes Eschar after achieving pain control using Lidocaine 5% topical ointment. No specimens were taken. A time out was conducted at 13:48, prior to the start of the procedure. A Minimum amount of bleeding was controlled with Pressure. The procedure was tolerated well with a pain level of 0 throughout and a pain level of 0 following the procedure. Post Debridement Measurements: 4cm length x 1cm width x 0.1cm depth; 0.314cm^3 volume. Character of Wound/Ulcer Post Debridement is improved. Post procedure Diagnosis Wound #10: Same as Pre-Procedure General Notes: Scribed for Dr. Celine Ahr by Blanche East, RN. Pre-procedure diagnosis of Wound #10 is a Trauma, Other located on the Left,Anterior Lower Leg . There was a Three Layer Compression Therapy Procedure by Blanche East, RN. Post procedure Diagnosis Wound #10: Same as Pre-Procedure Wound #7 Pre-procedure diagnosis of Wound #7 is a Venous Leg Ulcer located on the Right,Anterior Ankle .Severity of Tissue Pre Debridement is: Fat layer exposed. There was a Selective/Open Wound Non-Viable Tissue Debridement with a total area of 0.35 sq cm performed by Fredirick Maudlin, MD. With the following instrument(s): Curette Material removed includes Eschar and Slough and after achieving pain control using Lidocaine 5% topical ointment. No specimens were taken. A time out was conducted at 13:48, prior to the start of the procedure. A Minimum amount of bleeding was controlled with Pressure. The procedure was tolerated well with a pain level of 0 throughout and a pain level of 0 following the procedure. Post Debridement  Measurements: 0.7cm length x 0.5cm width x 0.1cm depth; 0.027cm^3 volume. Character of Wound/Ulcer Post Debridement is improved. Severity of Tissue Post Debridement is: Fat layer exposed. Post procedure Diagnosis Wound #7: Same as Pre-Procedure General Notes: Scribed for Dr. Celine Ahr by Blanche East, RN. Wound #9 Pre-procedure diagnosis of Wound #9 is a Trauma, Other located on the Right,Anterior Lower Leg . There was a Selective/Open Wound Non-Viable Tissue Debridement with a total area of 1 sq cm performed by Fredirick Maudlin, MD. With the following instrument(s): Curette Material removed includes Eschar and Slough and after achieving pain control using Lidocaine 5% topical ointment. No specimens were taken. A time out was conducted at 13:48, prior to the start of the procedure. A Minimum amount of bleeding was controlled with Pressure. The procedure was tolerated well with a pain level of 0 throughout and a pain level of 0 following the procedure. Post Debridement Measurements: 2cm length x 0.5cm width  x 0.1cm depth; 0.079cm^3 volume. Character of Wound/Ulcer Post Debridement is improved. Post procedure Diagnosis Wound #9: Same as Pre-Procedure General Notes: Scribed for Dr. Celine Ahr by Blanche East, RN. Pre-procedure diagnosis of Wound #9 is a Trauma, Other located on the Right,Anterior Lower Leg . There was a Three Layer Compression Therapy Procedure by Blanche East, RN. Post procedure Diagnosis Wound #9: Same as Pre-Procedure Plan Follow-up Appointments: Return Appointment in 1 week. - Dr. Celine Ahr- Rm 4 Monday Anesthetic: Wound #7 Right,Anterior Ankle: (In clinic) Topical Lidocaine 4% applied to wound bed - in clinic, prior to debridement Bathing/ Shower/ Hygiene: May shower and wash wound with soap and water. Edema Control - Lymphedema / SCD / Other: Elevate legs to the level of the heart or above for 30 minutes daily and/or when sitting, a frequency of: - throughout the day Avoid  standing for long periods of time. Exercise regularly Moisturize legs daily. - both legs nightly WOUND #10: - Lower Leg Wound Laterality: Left, Anterior Cleanser: Soap and Water 1 x Per Week/ Discharge Instructions: May shower and wash wound with dial antibacterial soap and water prior to dressing change. Prim Dressing: KerraCel Ag Gelling Fiber Dressing, 2x2 in (silver alginate) 1 x Per Week/ ary Discharge Instructions: Apply silver alginate to wound bed as instructed Secondary Dressing: ALLEVYN Gentle Border, 4x4 (in/in) 1 x Per Week/ Discharge Instructions: Apply over primary dressing as directed. Com pression Wrap: ThreePress (3 layer compression wrap) 1 x Per Week/ Discharge Instructions: Apply three layer compression as directed. WOUND #7: - Ankle Wound Laterality: Right, Anterior Cleanser: Soap and Water 1 x Per Week/ Discharge Instructions: May shower and wash wound with dial antibacterial soap and water prior to dressing change. Prim Dressing: KerraCel Ag Gelling Fiber Dressing, 2x2 in (silver alginate) 1 x Per Week/ ary Discharge Instructions: Apply silver alginate to wound bed as instructed Secondary Dressing: ALLEVYN Gentle Border, 4x4 (in/in) 1 x Per Week/ Discharge Instructions: Apply over primary dressing as directed. Com pression Wrap: ThreePress (3 layer compression wrap) 1 x Per Week/ Discharge Instructions: Apply three layer compression as directed. WOUND #9: - Lower Leg Wound Laterality: Right, Anterior Cleanser: Soap and Water 1 x Per Week/ Discharge Instructions: May shower and wash wound with dial antibacterial soap and water prior to dressing change. Prim Dressing: KerraCel Ag Gelling Fiber Dressing, 2x2 in (silver alginate) 1 x Per Week/ ary Discharge Instructions: Apply silver alginate to wound bed as instructed Secondary Dressing: ALLEVYN Gentle Border, 4x4 (in/in) 1 x Per Week/ Discharge Instructions: Apply over primary dressing as directed. Com pression  Wrap: ThreePress (3 layer compression wrap) 1 x Per Week/ Discharge Instructions: Apply three layer compression as directed. 08/13/2022: The patient dropped another object that struck him in the left anterior tibial surface. This has resulted in wound at this location. It is fairly superficial but does involve the fat layer. The right anterior tibial surface wound has a little bit of eschar on the surface and underneath, it is quite small. The right anterior ankle wound has also contracted but does have slough and eschar buildup as well. I used a curette to debride slough from the new wound and slough and eschar from his previous existing wounds. He has been trying to get by just wearing compression stockings, but I do not think they are providing adequate edema control. We are going to continue the silver alginate to all wound sites, but applied bilateral 3 layer compression. I am hopeful that this will speed up his wound healing.  Follow-up in 1 week. Electronic Signature(s) Signed: 08/13/2022 2:33:52 PM By: Fredirick Maudlin MD FACS Entered By: Fredirick Maudlin on 08/13/2022 14:33:52 -------------------------------------------------------------------------------- HxROS Details Patient Name: [name]: [name] [name] 08/13/2022 1:00 PM Medical Record Number: [id number] Patient Account Number: [id number] Date of Birth/Sex: Treating RN: [birth year] (81 y.o. M) Primary Care Provider: [name] Other Clinician: Referring Provider: Treating Provider/Extender: Benedict Needy Weeks in Treatment: 4 Information Obtained From Patient Eyes Medical History: Positive for: Cataracts - 2019 Ear/Nose/Mouth/Throat Medical History: Positive for: Chronic sinus problems/congestion Respiratory Medical History: Positive for: Asthma; Sleep Apnea Cardiovascular Medical History: Positive for: Hypertension; Peripheral Venous Disease Gastrointestinal Medical History: Past  Medical History Notes: GERD Endocrine Medical History: Negative for: Type II Diabetes Genitourinary Medical History: Past Medical History Notes: BPH Musculoskeletal Medical History: Positive for: Osteoarthritis Past Medical History Notes: Degenerative disc disease Neurologic Medical History: Past Medical History Notes: Neuromuscular disorder HBO Extended History Items Ear/Nose/Mouth/Throat: Eyes: Chronic sinus Cataracts problems/congestion Immunizations Pneumococcal Vaccine: Received Pneumococcal Vaccination: Yes Received Pneumococcal Vaccination On or After 60th Birthday: No Implantable Devices None Family and Social History Cancer: No; Diabetes: Yes - Maternal Grandparents; Heart Disease: Yes - Father; Hereditary Spherocytosis: No; Hypertension: Yes - Father; Kidney Disease: No; Lung Disease: No; Seizures: No; Stroke: Yes - Father; Thyroid Problems: Yes - Mother,Child; Tuberculosis: No; Never smoker; Marital Status - Single; Alcohol Use: Rarely; Drug Use: No History; Caffeine Use: Daily - coffee; Financial Concerns: No; Food, Clothing or Shelter Needs: No; Support System Lacking: No; Transportation Concerns: No Electronic Signature(s) Signed: 08/13/2022 4:56:11 PM By: Fredirick Maudlin MD FACS Entered By: Fredirick Maudlin on 08/13/2022 14:31:15 -------------------------------------------------------------------------------- SuperBill Details Patient Name: [name]: [name] [name] 08/13/2022 Medical Record Number: [id number] Patient Account Number: [id number] Date of Birth/Sex: Treating RN: May 13, 1941 (81 y.o. M) Primary Care Provider: [name] Other Clinician: Referring Provider: Treating Provider/Extender: Benedict Needy Weeks in Treatment: 4 Diagnosis Coding ICD-10 Codes Code Description [phone] Pressure ulcer of right ankle, stage 2 N18.30 Chronic kidney disease, stage 3 unspecified I87.2 Venous insufficiency (chronic)  (peripheral) L97.812 Non-pressure chronic ulcer of other part of right lower leg with fat layer exposed L97.822 Non-pressure chronic ulcer of other part of left lower leg with fat layer exposed Facility Procedures CPT4 Code: [id number] Description: [phone] - DEBRIDE WOUND 1ST 20 SQ CM OR < ICD-10 Diagnosis Description L89.512 Pressure ulcer of right ankle, stage 2 L97.812 Non-pressure chronic ulcer of other part of right lower leg with fat layer expos L97.822 Non-pressure chronic ulcer  of other part of left lower leg with fat layer expose Modifier: ed d Quantity: 1 Physician Procedures : CPT4 Code Description Modifier [id number] - WC PHYS LEVEL 3 - EST PT 25 ICD-10 Diagnosis Description L89.512 Pressure ulcer of right ankle, stage 2 L97.812 Non-pressure chronic ulcer of other part of right lower leg with fat layer exposed L97.822  Non-pressure chronic ulcer of other part of left lower leg with fat layer exposed I87.2 Venous insufficiency (chronic) (peripheral) Quantity: 1 Electronic Signature(s) Signed: 08/13/2022 2:34:16 PM By: Fredirick Maudlin MD FACS Entered By: Fredirick Maudlin on 08/13/2022 14:34:16

## 2022-08-19 ENCOUNTER — Other Ambulatory Visit: Payer: Self-pay | Admitting: Physical Medicine and Rehabilitation

## 2022-08-19 ENCOUNTER — Telehealth: Payer: Self-pay | Admitting: Physical Medicine and Rehabilitation

## 2022-08-19 DIAGNOSIS — M48062 Spinal stenosis, lumbar region with neurogenic claudication: Secondary | ICD-10-CM

## 2022-08-19 DIAGNOSIS — M5416 Radiculopathy, lumbar region: Secondary | ICD-10-CM

## 2022-08-19 NOTE — Telephone Encounter (Signed)
Called [name] to relay information.  [name] keeps arguing with me. Can we please call and explain to him?

## 2022-08-19 NOTE — Telephone Encounter (Signed)
Pt called and states pain is the same. No new pain going on. Pain is going down right leg but his back is killing him. Last injection worked about 60%.

## 2022-08-21 ENCOUNTER — Encounter (HOSPITAL_BASED_OUTPATIENT_CLINIC_OR_DEPARTMENT_OTHER): Payer: Medicare Other | Admitting: General Surgery

## 2022-08-21 DIAGNOSIS — L97812 Non-pressure chronic ulcer of other part of right lower leg with fat layer exposed: Secondary | ICD-10-CM | POA: Diagnosis not present

## 2022-08-21 DIAGNOSIS — L97312 Non-pressure chronic ulcer of right ankle with fat layer exposed: Secondary | ICD-10-CM | POA: Diagnosis not present

## 2022-08-21 DIAGNOSIS — M1990 Unspecified osteoarthritis, unspecified site: Secondary | ICD-10-CM | POA: Diagnosis not present

## 2022-08-21 DIAGNOSIS — L97822 Non-pressure chronic ulcer of other part of left lower leg with fat layer exposed: Secondary | ICD-10-CM | POA: Diagnosis not present

## 2022-08-21 DIAGNOSIS — M86179 Other acute osteomyelitis, unspecified ankle and foot: Secondary | ICD-10-CM | POA: Diagnosis not present

## 2022-08-21 DIAGNOSIS — S81802A Unspecified open wound, left lower leg, initial encounter: Secondary | ICD-10-CM | POA: Diagnosis not present

## 2022-08-21 DIAGNOSIS — I872 Venous insufficiency (chronic) (peripheral): Secondary | ICD-10-CM | POA: Diagnosis not present

## 2022-08-21 DIAGNOSIS — L89512 Pressure ulcer of right ankle, stage 2: Secondary | ICD-10-CM | POA: Diagnosis not present

## 2022-08-21 DIAGNOSIS — I890 Lymphedema, not elsewhere classified: Secondary | ICD-10-CM | POA: Diagnosis not present

## 2022-08-21 NOTE — Progress Notes (Signed)
ZIDANE, RENNER ([id number]) [id number] Page 1 of 10 Visit Report for 08/21/2022 Arrival Information Details Patient Name: [name]: RUTLEDGE, SELSOR [name] 08/21/2022 2:30 PM Medical Record Number: [id number] Patient Account Number: [id number] Date of Birth/Sex: Treating RN: 11/20/1940 (81 y.o. [name] Primary Care Teresa Lemmerman: [name] Other Clinician: Referring Diera Wirkkala: Treating Jossilyn Benda/Extender: Benedict Needy Weeks in Treatment: 6 Visit Information History Since Last Visit Added or deleted any medications: No Patient Arrived: [name] Any new allergies or adverse reactions: No Arrival Time: 14:54 Had a fall or experienced change in No Accompanied By: SELF activities of daily living that may affect Transfer Assistance: None risk of falls: Patient Identification Verified: Yes Signs or symptoms of abuse/neglect since last visito No Patient Requires Transmission-Based Precautions: No Hospitalized since last visit: No Patient Has Alerts: Yes Implantable device outside of the clinic excluding No Patient Alerts: ABI LLE 1.16, RLE 1.14 cellular tissue based products placed in the center since last visit: Has Dressing in Place as Prescribed: Yes Has Compression in Place as Prescribed: Yes Pain Present Now: No Electronic Signature(s) Signed: 08/21/2022 5:50:33 PM By: [name] RN Entered By: [name] on 08/21/2022 14:58:10 -------------------------------------------------------------------------------- Compression Therapy Details [name] 08/21/2022 2:30 PM Medical Record Number: [id number] Patient Account Number: [id number] Date of Birth/Sex: Treating RN: 04/06/1941 (81 y.o. [name] Primary Care Orpha Dain: [name] Other Clinician: Referring Edmonia Gonser: Treating Gredmarie Delange/Extender: Benedict Needy Weeks in Treatment:  6 Compression Therapy Performed for Wound Assessment: Wound #7 Right,Anterior Ankle Performed By: Clinician [name], RN Compression Type: Three Layer Post Procedure Diagnosis Same as Pre-procedure Electronic Signature(s) Signed: 08/21/2022 5:50:33 PM By: [name] RN Entered By: [name] on 08/21/2022 15:46:16 [name] ([id number]) [id number] Page 2 of 10 -------------------------------------------------------------------------------- Compression Therapy Details Patient Name: [name]: PIKE, [place] [name] 08/21/2022 2:30 PM Medical Record Number: [id number] Patient Account Number: [id number] Date of Birth/Sex: Treating RN: 12-Nov-1940 (81 y.o. [name] Primary Care Illias Pantano: [name] Other Clinician: Referring Dazani Norby: Treating Anniebelle Devore/Extender: Benedict Needy Weeks in Treatment: 6 Compression Therapy Performed for Wound Assessment: Wound #10 Left,Anterior Lower Leg Performed By: Clinician [name], RN Compression Type: Three Layer Post Procedure Diagnosis Same as Pre-procedure Electronic Signature(s) Signed: 08/21/2022 5:50:33 PM By: [name] RN Entered By: [name] on 08/21/2022 15:46:16 -------------------------------------------------------------------------------- Encounter Discharge Information Details [name] 08/21/2022 2:30 PM Medical Record Number: [id number] Patient Account Number: [id number] Date of Birth/Sex: Treating RN: 10-03-1941 (81 y.o. [name] Primary Care Akeem Heppler: [name] Other Clinician: Referring Laurina Fischl: Treating Malike Foglio/Extender: Benedict Needy Weeks in Treatment: 6 Encounter Discharge Information Items Post Procedure Vitals Discharge Condition: Stable Temperature (F): 98.5 Ambulatory Status: Cane Pulse (bpm): 71 Discharge Destination:  Home Respiratory Rate (breaths/min): 20 Transportation: Private Auto Blood Pressure (mmHg): 125/62 Accompanied By: self Schedule Follow-up Appointment: Yes Clinical Summary of Care: Patient Declined Electronic Signature(s) Signed: 08/21/2022 5:50:33 PM By: [name] RN Entered By: [name] on 08/21/2022 17:22:42 -------------------------------------------------------------------------------- Lower Extremity Assessment Details [name] 08/21/2022 2:30 PM Medical Record Number: [id number] Patient Account Number: [id number] Date of Birth/Sex: Treating RN: 06-Oct-1941 (81 y.o. [name] Primary Care Kurt Azimi: [name] Other Clinician: Referring Bernie Ransford: Treating Karly Pitter/Extender: Benedict Needy Weeks in Treatment: 6 Edema Assessment Assessed: [Left: No] [Right: No] [Left:  Edema] [name]: Randalyn Rhea ([id number]) [id number] Page 3 of 10 Left: Right: Point of Measurement: From Medial Instep 36.5 cm 41 cm Ankle Left: Right: Point of Measurement: From Medial Instep 24.5 cm 25 cm Vascular Assessment Pulses: Dorsalis Pedis Palpable: [Left:Yes] [Right:Yes] Electronic Signature(s) Signed: 08/21/2022 5:50:33 PM By: [name] RN Entered By: [name] on 08/21/2022 15:07:06 -------------------------------------------------------------------------------- Multi Wound Chart Details [name] 08/21/2022 2:30 PM Medical Record Number: [id number] Patient Account Number: [id number] Date of Birth/Sex: Treating RN: [birth year] (81 y.o. M) Primary Care Delia Sitar: [name] Other Clinician: Referring Yaslene Lindamood: Treating Pheobe Sandiford/Extender: Benedict Needy Weeks in Treatment: 6 Vital Signs Height(in): 68 Pulse(bpm): 71 Weight(lbs): 245 Blood Pressure(mmHg): 125/62 Body Mass Index(BMI):  37.2 Temperature(F): 98.5 Respiratory Rate(breaths/min): 20 [10:Photos:] Left, Anterior Lower Leg Right, Anterior Ankle Right, Anterior Lower Leg Wound Location: Skin Tear/Laceration Footwear Injury Trauma Wounding Event: Trauma, Other Venous Leg Ulcer Trauma, Other Primary Etiology: Cataracts, Chronic sinus Cataracts, Chronic sinus Cataracts, Chronic sinus Comorbid History: problems/congestion, Asthma, Sleep problems/congestion, Asthma, Sleep problems/congestion, Asthma, Sleep Apnea, Hypertension, Peripheral Apnea, Hypertension, Peripheral Apnea, Hypertension, Peripheral Venous Disease, Osteoarthritis Venous Disease, Osteoarthritis Venous Disease, Osteoarthritis 08/09/2022 07/03/2022 07/24/2022 Date Acquired: $RemoveBefore'1 6 3 'iOTsPcORTHsKs$ Weeks of Treatment: Open Open Open Wound Status: No No No Wound Recurrence: 0.3x0.3x0.1 0.6x0.5x0.1 0.1x0.1x0.1 Measurements L x W x D (cm) 0.071 0.236 0.008 A (cm) : rea 0.007 0.024 0.001 Volume (cm) : 97.70% 83.30% 99.70% % Reduction in Area: 97.80% 83.00% 99.60% % Reduction in Volume: Full Thickness Without Exposed Full Thickness Without Exposed Full Thickness Without Exposed Classification: Support Structures Support Structures Support Structures Medium Medium N/A Exudate Amount: Serosanguineous Serosanguineous N/A Exudate Type: red, brown red, brown N/A Exudate Color: Flat and Intact Flat and Intact Flat and Intact Wound Margin: Medium (34-66%) Small (1-33%) Medium (34-66%) Granulation Amount: JAXSON, ANGLIN ([id number]) [id number] Page 4 of 10 Pink Red, Pink Pink Granulation Quality: Medium (34-66%) Large (67-100%) Medium (34-66%) Necrotic Amount: Eschar, Adherent Slough Eschar, Adherent [place] Necrotic Tissue: Fat Layer (Subcutaneous Tissue): Yes Fat Layer (Subcutaneous Tissue): Yes Fat Layer (Subcutaneous Tissue): Yes Exposed Structures: Fascia: No Fascia: No Fascia: No Tendon:  No Tendon: No Tendon: No Muscle: No Muscle: No Muscle: No Joint: No Joint: No Joint: No Bone: No Bone: No Bone: No N/A Small (1-33%) Medium (34-66%) Epithelialization: Debridement - Selective/Open Wound Debridement - Selective/Open Wound N/A Debridement: Pre-procedure Verification/Time Out 15:41 15:41 N/A Taken: Lidocaine 5% topical ointment Lidocaine 5% topical ointment N/A Pain Control: Necrotic/Eschar Necrotic/Eschar, Slough N/A Tissue Debrided: Non-Viable Tissue Non-Viable Tissue N/A Level: 0.09 0.3 N/A Debridement A (sq cm): rea Curette Curette N/A Instrument: Minimum Minimum N/A Bleeding: Pressure Pressure N/A Hemostasis A chieved: 0 0 N/A Procedural Pain: 0 0 N/A Post Procedural Pain: Procedure was tolerated well Procedure was tolerated well N/A Debridement Treatment Response: 0.3x0.3x0.1 0.6x0.5x0.1 N/A Post Debridement Measurements L x W x D (cm) 0.007 0.024 N/A Post Debridement Volume: (cm) Excoriation: Yes Excoriation: Yes Excoriation: No Periwound Skin Texture: Dry/Scaly: Yes Maceration: Yes Dry/Scaly: Yes Periwound Skin Moisture: Dry/Scaly: Yes Maceration: No Cool/Cold N/A Cool/Cold Temperature: Debridement Debridement N/A Procedures Performed: Treatment Notes Electronic Signature(s) Signed: 08/21/2022 3:45:45 PM By: Fredirick Maudlin MD FACS Entered By: Fredirick Maudlin on 08/21/2022 15:45:45 -------------------------------------------------------------------------------- Multi-Disciplinary Care Plan Details [name] 08/21/2022 2:30 PM Medical Record Number: [id number] Patient Account Number: [id number] Date of Birth/Sex: Treating RN: [birth year] (81 y.o. [name] Primary  Care Bonnye Halle: [name] Other Clinician: Referring Quency Tober: Treating Stepahnie Campo/Extender: Benedict Needy Weeks in Treatment: 6 Active Inactive Venous Leg Ulcer Nursing  Diagnoses: Potential for venous Insuffiency (use before diagnosis confirmed) Goals: Patient will maintain optimal edema control Date Initiated: 07/10/2022 Target Resolution Date: 09/04/2022 Goal Status: Active Interventions: Assess peripheral edema status every visit. Treatment Activities: Therapeutic compression applied : 07/10/2022 Notes: Wound/Skin Impairment [name], NUSZ ([id number]) [phone] Page 5 of 10 Nursing Diagnoses: Impaired tissue integrity Goals: Ulcer/skin breakdown will have a volume reduction of 30% by week 4 Date Initiated: 07/10/2022 Date Inactivated: 08/13/2022 Target Resolution Date: 08/13/2022 Goal Status: Met Ulcer/skin breakdown will have a volume reduction of 50% by week 8 Date Initiated: 08/13/2022 Target Resolution Date: 09/17/2022 Goal Status: Active Interventions: Provide education on ulcer and skin care Treatment Activities: Skin care regimen initiated : 07/10/2022 Topical wound management initiated : 07/10/2022 Notes: Electronic Signature(s) Signed: 08/21/2022 5:50:33 PM By: [name] RN Entered By: [name] on 08/21/2022 17:20:25 -------------------------------------------------------------------------------- Pain Assessment Details [name] 08/21/2022 2:30 PM Medical Record Number: [id number] Patient Account Number: [id number] Date of Birth/Sex: Treating RN: 1941-03-21 (81 y.o. [name] Primary Care Tayva Easterday: [name] Other Clinician: Referring Keilee Denman: Treating Karianna Gusman/Extender: Benedict Needy Weeks in Treatment: 6 Active Problems Location of Pain Severity and Description of Pain Patient Has Paino No Site Locations Pain Management and Medication Current Pain Management: Electronic Signature(s) Signed: 08/21/2022 5:50:33 PM By: [name] RN Entered By: [name] on 08/21/2022 14:58:28 [name]  ([id number]) [id number] Page 6 of 10 -------------------------------------------------------------------------------- Patient/Caregiver Education Details Patient Name: [name]: MICAL, KICKLIGHTER. 10/12/2023andnbsp2:30 PM Medical Record Number: [id number] Patient Account Number: [id number] Date of Birth/Gender: Treating RN: [birth year] (81 y.o. [name] Primary Care Physician: [name] Other Clinician: Referring Physician: Treating Physician/Extender: Benedict Needy Weeks in Treatment: 6 Education Assessment Education Provided To: Patient Education Topics Provided Wound/Skin Impairment: Methods: Explain/Verbal Responses: Return demonstration correctly Electronic Signature(s) Signed: 08/21/2022 5:50:33 PM By: [name] RN Entered By: [name] on 08/21/2022 17:21:56 -------------------------------------------------------------------------------- Wound Assessment Details [name] 08/21/2022 2:30 PM Medical Record Number: [id number] Patient Account Number: [id number] Date of Birth/Sex: Treating RN: 1941/06/23 (81 y.o. [name] Primary Care Cydne Grahn: [name] Other Clinician: Referring Kati Riggenbach: Treating Jalyn Rosero/Extender: Benedict Needy Weeks in Treatment: 6 Wound Status Wound Number: 10 Primary Trauma, Other Etiology: Wound Location: Left, Anterior Lower Leg Wound Open Wounding Event: Skin Tear/Laceration Status: Date Acquired: 08/09/2022 Comorbid Cataracts, Chronic sinus problems/congestion, Asthma, Sleep Weeks Of Treatment: 1 History: Apnea, Hypertension, Peripheral Venous Disease, Osteoarthritis Clustered Wound: No Photos Wound Measurements Length: (cm) 0. Width: (cm) 0. JOHNNEY, SCARLATA ([id number]) Depth: (cm) 0 Area: (cm) Volume: (cm) 3 % Reduction in Area: 97.7% 3 % Reduction in Volume:  97.8% [id number] Page 7 of 10 .1 Tunneling: No 0.071 Undermining: No 0.007 Wound Description Classification: Full Thickness Without Exposed Suppor Wound Margin: Flat and Intact Exudate Amount: Medium Exudate Type: Serosanguineous Exudate Color: red, brown t Structures Foul Odor After Cleansing: No Slough/Fibrino Yes Wound Bed Granulation Amount: Medium (34-66%) Exposed Structure Granulation Quality: Pink Fascia Exposed: No Necrotic Amount: Medium (34-66%) Fat Layer (Subcutaneous Tissue) Exposed: Yes Necrotic Quality: Eschar, Adherent Slough Tendon Exposed: No Muscle Exposed: No Joint Exposed: No Bone Exposed: No Periwound Skin Texture Texture Color No Abnormalities Noted: No No Abnormalities Noted: No  Excoriation: Yes Temperature / Pain Temperature: Cool/Cold Moisture No Abnormalities Noted: No Dry / Scaly: Yes Treatment Notes Wound #10 (Lower Leg) Wound Laterality: Left, Anterior Cleanser Soap and Water Discharge Instruction: May shower and wash wound with dial antibacterial soap and water prior to dressing change. Peri-Wound Care Topical Primary Dressing KerraCel Ag Gelling Fiber Dressing, 2x2 in (silver alginate) Discharge Instruction: Apply silver alginate to wound bed as instructed Secondary Dressing ALLEVYN Gentle Border, 4x4 (in/in) Discharge Instruction: Apply over primary dressing as directed. Secured With Compression Wrap ThreePress (3 layer compression wrap) Discharge Instruction: Apply three layer compression as directed. Compression Stockings Add-Ons Electronic Signature(s) Signed: 08/21/2022 5:50:33 PM By: [name] RN Entered By: [name] on 08/21/2022 15:28:20 -------------------------------------------------------------------------------- Wound Assessment Details [name] 08/21/2022 2:30 PM Medical Record Number: [id number] Patient Account Number:  [id number] Date of Birth/Sex: Treating RN: 06/29/1941 (81 y.o. [place], [place], [place] ([id number]) [id number] Page 8 of 10 Primary Care Dessirae Scarola: [name] Other Clinician: Referring Jani Ploeger: Treating Murray Durrell/Extender: Benedict Needy Weeks in Treatment: 6 Wound Status Wound Number: 7 Primary Venous Leg Ulcer Etiology: Wound Location: Right, Anterior Ankle Wound Open Wounding Event: Footwear Injury Status: Date Acquired: 07/03/2022 Comorbid Cataracts, Chronic sinus problems/congestion, Asthma, Sleep Weeks Of Treatment: 6 History: Apnea, Hypertension, Peripheral Venous Disease, Osteoarthritis Clustered Wound: No Photos Wound Measurements Length: (cm) 0.6 Width: (cm) 0.5 Depth: (cm) 0.1 Area: (cm) 0.236 Volume: (cm) 0.024 % Reduction in Area: 83.3% % Reduction in Volume: 83% Epithelialization: Small (1-33%) Tunneling: No Undermining: No Wound Description Classification: Full Thickness Without Exposed Support Structures Wound Margin: Flat and Intact Exudate Amount: Medium Exudate Type: Serosanguineous Exudate Color: red, brown Foul Odor After Cleansing: No Slough/Fibrino Yes Wound Bed Granulation Amount: Small (1-33%) Exposed Structure Granulation Quality: Red, Pink Fascia Exposed: No Necrotic Amount: Large (67-100%) Fat Layer (Subcutaneous Tissue) Exposed: Yes Necrotic Quality: Eschar, Adherent Slough Tendon Exposed: No Muscle Exposed: No Joint Exposed: No Bone Exposed: No Periwound Skin Texture Texture Color No Abnormalities Noted: No No Abnormalities Noted: No Excoriation: Yes Moisture No Abnormalities Noted: No Dry / Scaly: Yes Maceration: Yes Treatment Notes Wound #7 (Ankle) Wound Laterality: Right, Anterior Cleanser Soap and Water Discharge Instruction: May shower and wash wound with dial antibacterial soap and water prior to dressing change. Peri-Wound Care Topical Primary  Dressing KerraCel Ag Gelling Fiber Dressing, 2x2 in (silver alginate) Discharge Instruction: Apply silver alginate to wound bed as instructed SUMNER, KIRCHMAN ([id number]) [phone].pdf Page 9 of 10 Secondary Dressing ALLEVYN Gentle Border, 4x4 (in/in) Discharge Instruction: Apply over primary dressing as directed. Secured With Compression Wrap ThreePress (3 layer compression wrap) Discharge Instruction: Apply three layer compression as directed. Compression Stockings Add-Ons Electronic Signature(s) Signed: 08/21/2022 5:50:33 PM By: [name] RN Entered By: [name] on 08/21/2022 15:29:15 -------------------------------------------------------------------------------- Wound Assessment Details [name] 08/21/2022 2:30 PM Medical Record Number: [id number] Patient Account Number: [id number] Date of Birth/Sex: Treating RN: 06-21-1941 (80 y.o. [name] Primary Care Natasja Niday: [name] Other Clinician: Referring Nicky Milhouse: Treating Mont Jagoda/Extender: Benedict Needy Weeks in Treatment: 6 Wound Status Wound Number: 9 Primary Trauma, Other Etiology: Wound Location: Right, Anterior Lower Leg Wound Healed - Epithelialized Wounding Event: Trauma Status: Date Acquired: 07/24/2022 Comorbid Cataracts, Chronic sinus problems/congestion, Asthma, Sleep Weeks Of Treatment: 3 History: Apnea, Hypertension, Peripheral Venous Disease, Osteoarthritis Clustered Wound: No Photos Wound Measurements Length: (cm) Width: (cm) Depth: (cm) Area: (cm) Volume: (  cm) 0 % Reduction in Area: 100% 0 % Reduction in Volume: 100% 0 Epithelialization: Large (67-100%) 0 Tunneling: No 0 Undermining: No Wound Description Classification: Full Thickness Without Exposed Support Structures Wound Margin: Flat and Intact Exudate Amount: None Present Foul Odor After Cleansing: No Slough/Fibrino  No Wound Bed Granulation Amount: None Present (0%) Exposed Structure Necrotic Amount: None Present (0%) Fascia Exposed: No Fat Layer (Subcutaneous Tissue) Exposed: No Tendon Exposed: No HAL, [place] ([id number]) [id number] Page 10 of 10 Muscle Exposed: No Joint Exposed: No Bone Exposed: No Periwound Skin Texture Texture Color No Abnormalities Noted: No No Abnormalities Noted: No Excoriation: No Temperature / Pain Temperature: Cool/Cold Moisture No Abnormalities Noted: No Dry / Scaly: Yes Maceration: No Electronic Signature(s) Signed: 08/21/2022 5:50:33 PM By: [name] RN Entered By: [name] on 08/21/2022 15:55:13 -------------------------------------------------------------------------------- Vitals Details [name] 08/21/2022 2:30 PM Medical Record Number: [id number] Patient Account Number: [id number] Date of Birth/Sex: Treating RN: 1941/07/04 (81 y.o. [name] Primary Care Archie Atilano: [name] Other Clinician: Referring Riyanna Crutchley: Treating Costantino Kohlbeck/Extender: Benedict Needy Weeks in Treatment: 6 Vital Signs Time Taken: 14:58 Temperature (F): 98.5 Height (in): 68 Pulse (bpm): 71 Weight (lbs): 245 Respiratory Rate (breaths/min): 20 Body Mass Index (BMI): 37.2 Blood Pressure (mmHg): 125/62 Reference Range: 80 - 120 mg / dl Electronic Signature(s) Signed: 08/21/2022 5:50:33 PM By: [name] RN Entered By: [name] on 08/21/2022 15:24:02

## 2022-08-21 NOTE — Progress Notes (Signed)
[name] ([id number]) [id number] Page 1 of 12 Visit Report for 08/21/2022 Chief Complaint Document Details Patient Name: [name]: [name] Hanson, [name] [name] 08/21/2022 2:30 PM Medical Record Number: [id number] Patient Account Number: [id number] Date of Birth/Sex: Treating RN: [birth year] (81 y.o. 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 08/21/2022 2:30 PM Medical Record Number: [id number] Patient Account Number: [id number] Date of Birth/Sex: Treating RN: 08/05/1941 (81 y.o. [name] Primary Care Provider: [name] Other Clinician: Referring Provider: Treating Provider/Extender: Benedict Needy Weeks in Treatment: 6 Debridement Performed for Assessment: Wound #7 Right,Anterior Ankle Performed By: Physician Fredirick Maudlin, MD Debridement Type: Debridement Severity of Tissue Pre Debridement: Fat layer exposed Level of Consciousness (Pre-procedure): Awake and Alert Pre-procedure Verification/Time Out Yes - 15:41 Taken: Start Time: 15:41 Pain Control: Lidocaine 5% topical ointment T Area Debrided (L x W): otal 0.6 (cm) x 0.5 (cm) =  0.3 (cm) Tissue and other material debrided: Non-Viable, Eschar, Slough, Slough Level: Non-Viable Tissue Debridement Description: Selective/Open Wound Instrument: Curette Bleeding: Minimum Hemostasis Achieved: Pressure End Time: 15:42 Procedural Pain: 0 Post Procedural Pain: 0 Response to Treatment: Procedure was tolerated well Level of Consciousness (Post- Awake and Alert procedure): Post Debridement Measurements of Total Wound Length: (cm) 0.6 Width: (cm) 0.5 Depth: (cm) 0.1 [name] Hanson ([id number]) [id number] Page 2 of 12 Volume: (cm) 0.024 Character of Wound/Ulcer Post Debridement: Improved Severity of Tissue Post Debridement: Fat layer exposed Post Procedure Diagnosis Same as Pre-procedure Notes Scribed for Dr. Celine Ahr by J.Scotton Electronic Signature(s) Signed: 08/21/2022 4:11:25 PM By: Fredirick Maudlin MD FACS Signed: 08/21/2022 5:50:33 PM By: [name] RN Entered By: [name] on 08/21/2022 15:43:45 -------------------------------------------------------------------------------- Debridement Details Patient Name: [name]: [name] Banker [name] 08/21/2022 2:30 PM Medical Record Number: [id number] Patient Account Number: [id number] Date of Birth/Sex: Treating RN: 07/02/1941 (81 y.o. [name] Primary Care Provider: [name] Other Clinician: Referring Provider: Treating Provider/Extender: Benedict Needy Weeks in Treatment: 6 Debridement Performed for Assessment: Wound #10 Left,Anterior Lower Leg Performed By: Physician Fredirick Maudlin, MD Debridement Type: Debridement Level of Consciousness (Pre-procedure): Awake and Alert Pre-procedure Verification/Time Out Yes - 15:41 Taken: Start Time: 15:41 Pain Control: Lidocaine 5% topical ointment T Area Debrided (L x W): otal 0.3 (cm) x 0.3 (cm) = 0.09 (cm) Tissue and other material debrided: Non-Viable, Eschar Level: Non-Viable  Tissue Debridement Description: Selective/Open Wound Instrument: Curette Bleeding: Minimum Hemostasis Achieved: Pressure End Time: 15:42 Procedural Pain: 0 Post Procedural Pain: 0 Response to Treatment: Procedure was tolerated well Level of Consciousness (Post- Awake and Alert procedure): Post Debridement Measurements of Total Wound Length: (cm) 0.3 Width: (cm) 0.3 Depth: (cm) 0.1 Volume: (cm) 0.007 Character of Wound/Ulcer Post Debridement: Improved Post Procedure Diagnosis Same as Pre-procedure Notes Scribed for Dr.Raiza Kiesel by J.Scotton Electronic Signature(s) Signed: 08/21/2022 4:11:25 PM By: Fredirick Maudlin MD FACS Signed: 08/21/2022  5:50:33 PM By: [name] RN Entered By: [name] on 08/21/2022 15:45:42 [name] Hanson ([id number]) [id number] Page 3 of 12 -------------------------------------------------------------------------------- HPI Details Patient Name: [name]: [name] Hanson, [name] [name] 08/21/2022 2:30 PM Medical Record Number: [id number] Patient Account Number: [id number] Date of Birth/Sex: Treating RN: 18-Aug-1941 (81 y.o. M) Primary Care Provider: [name] Other Clinician: Referring Provider: Treating Provider/Extender: Benedict Needy Weeks in Treatment: 6 History of Present Illness HPI Description: ADMISSION 02/07/2021 This is an 81 year old man who is not a diabetic "prediabetic" who comes in today letter asking [place] to look at an area on his right first toe tip that has been there about a year. He has been getting this dressed that [employer] and he had Xeroform on this with gauze. The patient is not exactly sure how this came about but he is not able to dress the wound himself because of limitations due to what sounds like spinal stenosis and pain. He had a wound on the ankle as well but that healed over. He was seen by vascular in May 2021. This was related to a right great toe wound. He had an  angiogram that was completely normal on both sides good perfusion right into the feet. He also looks like he has lymphedema and he has compression stockings but he cannot get them on and off so he essentially leaves he is on even when he showering Past medical history includes prediabetes, low back pain secondary I think the lumbar spinal stenosis has been offered a decompressive laminectomy but he has not gone forth with this. 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 ABI was noncompressible in our clinic on the right right first toe. 4/7; patient I admitted the clinic last week. He has an area on the tip of his right first toe. We cleaned this off last week and have been using silver collagen. He is in the same crocs today that he wore last week and I told him not to use. For 1 reason or another he could not handle a surgical sandal. 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 He is not a diabetic 4/12; right first toe wound chronic. We have been using silver collagen. Once again he comes in then the same pair of old crocs that he comes in every week. 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 His x-ray suggested suspicious for osteomyelitis with bone irregularity at the tuft of the right great toe 4/21; patient has been using silver collagen every other day to the wound on his right great toe. He continues to wear crocs. He has no complaints or issues today. 5/5; 2-week follow-up. 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 Mild soft tissue edema with enhancement at the distal aspect of the great toe suggestive of cellulitis. Fortunately the wound actually looks better. Has been using Hydrofera Blue 5/12; started him on Augmentin last week 875 twice daily he is tolerating this well. Last creatinine I see is  1.77 in early April. We are using Hydrofera Blue to the wound which actually looks better today. 5/17; tolerating the Augmentin well. He says the wound is actually been hurting him episodically. Actually the surface of this looks quite good. He did not get his lab work done. We  are using [facility] he is changing this himself 6/2; still taking Augmentin he should be rounding into his last week. This was empirically for underlying osteomyelitis. The area on the tip of his toe is still open still with some depth but no palpable bone. We have been using Hydrofera Blue. Surface area of the wound has not been making much improvement Is work was essentially unremarkable except for a creatinine of 1.79. Reason for his renal insufficiency is not clear he is not a diabetic does not have hypertension however he does take NSAIDs and Lasix I wonder if this is the issue here. Note that his creatinine on 11/12/2018 was 1.45. Platelet count slightly low at 143 however his inflammatory markers were really in the normal range at 10 and 2 sedimentation rate and C-reactive protein respectively. 6/9; he apparently had another 2 weeks of Augmentin after we look that this last time. Therefore he should be coming into his last week next week. We are using polymen on the tip of the toe. He cannot get down to do the dressing so he comes in for a nurse visit. He comes in in crocs I have warned him against this although he says he is using a different shoe for most of the day he works in [employer]. 6/16; he comes back in in crocs. I have spoken him about this before. His areas on the tip of his toe this actually looks quite good we have been using polymen. He has completed his antibiotics which we empirically gave him for underlying osteomyelitis. Everything looks better to me in spite of his noncompliance with footwear recommendation 7/11; patient has been followed through nurse visits. He was last seen by provider on  6/16. He reports improvement to the wound with the use of PolyMem. He denies signs of infection. 7/21; the patient arrives with the area on the tip of his right great toe completely healed. He has the same proximal on that I see every time. I treated him empirically with Augmentin for osteomyelitis. He needs to be vigilant about this. READMISSION 03/19/2022 The patient returns with a reopening of the right great toe wound. Apparently this occurred in around October of last year. He was followed by podiatry for some time. They have performed x-rays but unfortunately, we do not have access to them or the results. No report as to whether or not he might have osteomyelitis. In addition, he has a new wound on the medial aspect of the right great toe as well as the anterior tibial surface of his right lower leg. He does state that he was wearing compression stockings when this tibial wound occurred. He continues to wear crocs. 04/02/2022: For some reason, the patient has not been seen in 2 weeks. He says that he had to reschedule his visit. Therefore his Coflex compression wrap was left in situ for 14 days. It appears that his skin became macerated and he has opened up several new superficial wounds on his right anterior tibial surface. We did get an x-ray to evaluate for osteomyelitis and none was seen. All of the pre-existing wounds are smaller today and the new wounds look like they are limited to just the breakdown of skin. 04/10/2022: The wounds on his right anterior tibial surface are actually a little bit larger today. He seems to have some skin irritation and maceration in the area again. The tip of his toe is nearly closed as is the wound on the dorsal surface of  his right great toe. READMISSION 07/10/2022 Since our last visit, the patient went to the operating room to have a left hip replacement, but in the preop holding area, his surgeon noted a foul odor coming from his right foot. He contacted  me and described the findings of his right great toe and we mutually agreed, along with the patient, that amputation would be most appropriate. 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 Since his surgery, he has been wearing compression stockings bilaterally, but as he is unable to apply or remove them himself, he wears them for a week at a time. This appears to have resulted in a pressure injury on his right anterior ankle at the point of flexion; he has a similar looking abrasion that has not broken the skin in the same site on the left. 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 [name], [name] Hanson ([id number]) [id number] Page 4 of 12 07/21/2022: The right anterior ankle wound is nearly closed aside from a small open area that has some slough accumulation. The amputation site has crusting and eschar present. Once this was removed, it was revealed that the amputation site has healed. The abrasion on the left anterior ankle at the point of flexion is no longer present. 07/28/2022: The patient dropped a door on his leg and has a new wound to his right anterior tibial surface. There is some nonviable skin hanging from the cranial portion of the wound. The fat layer is exposed. The right anterior ankle wound is a little bit smaller and has some accumulated slough present. 08/04/2022: The wound on his right anterior tibial surface is a little bit smaller today. There is still slough accumulation. The right anterior ankle wound is quite a bit smaller today and also has slough buildup on the surface. 08/13/2022: The patient dropped another object that struck him in the left anterior tibial surface. This has resulted in wound at this location. It is fairly superficial but does involve the fat layer. The right anterior tibial surface wound has a little bit of eschar on the surface and underneath, it  is quite small. The right anterior ankle wound has also contracted but does have slough and eschar buildup as well. 08/21/2022: The right anterior tibial wound has healed. The left anterior tibial wound is quite a bit smaller and more superficial with just a little eschar on the surface. The right anterior ankle wound also continues to contract but continues to accumulate slough and eschar, as well. Electronic Signature(s) Signed: 08/21/2022 3:46:42 PM By: Fredirick Maudlin MD FACS Entered By: Fredirick Maudlin on 08/21/2022 15:46:42 -------------------------------------------------------------------------------- Physical Exam Details Patient Name: [name]: [name] Banker [name] 08/21/2022 2:30 PM Medical Record Number: [id number] Patient Account Number: [id number] Date of Birth/Sex: Treating RN: 1941/07/29 (81 y.o. M) Primary Care Provider: [name] Other Clinician: Referring Provider: Treating Provider/Extender: Benedict Needy Weeks in Treatment: 6 Constitutional . . . . No acute distress.[name] Respiratory Normal work of breathing on room air.. Notes 08/21/2022: The right anterior tibial wound has healed. The left anterior tibial wound is quite a bit smaller and more superficial with just a little eschar on the surface. The right anterior ankle wound also continues to contract but continues to accumulate slough and eschar, as well. Electronic Signature(s) Signed: 08/21/2022 3:47:09 PM By: Fredirick Maudlin MD FACS Entered By: Fredirick Maudlin on 08/21/2022 15:47:09 -------------------------------------------------------------------------------- Physician  Orders Details Patient Name: [name]: KAVEON, BLATZ [name] 08/21/2022 2:30 PM Medical Record Number: [id number] Patient Account Number: [id number] Date of Birth/Sex: Treating RN: [birth year] (81 y.o. [name] Primary Care Provider: [name] Other Clinician: Referring  Provider: Treating Provider/Extender: Benedict Needy Weeks in Treatment: 6 Verbal / Phone Orders: No Diagnosis Coding ICD-10 Coding Code Description [phone] Pressure ulcer of right ankle, stage 2 SELWYN, REASON ([id number]) [id number] Page 5 of 12 N18.30 Chronic kidney disease, stage 3 unspecified I87.2 Venous insufficiency (chronic) (peripheral) L97.812 Non-pressure chronic ulcer of other part of right lower leg with fat layer exposed L97.822 Non-pressure chronic ulcer of other part of left lower leg with fat layer exposed Follow-up Appointments ppointment in 1 week. - Dr. Celine Ahr- Rm 4 Return A Anesthetic Wound #7 Right,Anterior Ankle (In clinic) Topical Lidocaine 4% applied to wound bed - in clinic, prior to debridement Bathing/ Shower/ Hygiene May shower and wash wound with soap and water. Edema Control - Lymphedema / SCD / Other Left Lower Extremity Elevate legs to the level of the heart or above for 30 minutes daily and/or when sitting, a frequency of: - throughout the day Avoid standing for long periods of time. Exercise regularly Moisturize legs daily. - both legs nightly Wound Treatment Wound #10 - Lower Leg Wound Laterality: Left, Anterior Cleanser: Soap and Water 1 x Per Week Discharge Instructions: May shower and wash wound with dial antibacterial soap and water prior to dressing change. Prim Dressing: KerraCel Ag Gelling Fiber Dressing, 2x2 in (silver alginate) 1 x Per Week ary Discharge Instructions: Apply silver alginate to wound bed as instructed Secondary Dressing: ALLEVYN Gentle Border, 4x4 (in/in) 1 x Per Week Discharge Instructions: Apply over primary dressing as directed. Compression Wrap: ThreePress (3 layer compression wrap) 1 x Per Week Discharge Instructions: Apply three layer compression as directed. Wound #7 - Ankle Wound Laterality: Right, Anterior Cleanser: Soap and Water 1 x Per Week Discharge  Instructions: May shower and wash wound with dial antibacterial soap and water prior to dressing change. Prim Dressing: KerraCel Ag Gelling Fiber Dressing, 2x2 in (silver alginate) 1 x Per Week ary Discharge Instructions: Apply silver alginate to wound bed as instructed Secondary Dressing: ALLEVYN Gentle Border, 4x4 (in/in) 1 x Per Week Discharge Instructions: Apply over primary dressing as directed. Compression Wrap: ThreePress (3 layer compression wrap) 1 x Per Week Discharge Instructions: Apply three layer compression as directed. Wound #9 - Lower Leg Wound Laterality: Right, Anterior Cleanser: Soap and Water 1 x Per Week/30 Days Discharge Instructions: May shower and wash wound with dial antibacterial soap and water prior to dressing change. Prim Dressing: KerraCel Ag Gelling Fiber Dressing, 2x2 in (silver alginate) 1 x Per Week/30 Days ary Discharge Instructions: Apply silver alginate to wound bed as instructed Secondary Dressing: ALLEVYN Gentle Border, 4x4 (in/in) 1 x Per Week/30 Days Discharge Instructions: Apply over primary dressing as directed. Compression Wrap: ThreePress (3 layer compression wrap) 1 x Per Week/30 Days Discharge Instructions: Apply three layer compression as directed. Electronic Signature(s) Signed: 08/21/2022 4:11:25 PM By: Fredirick Maudlin MD FACS Entered By: Fredirick Maudlin on 08/21/2022 15:52:46 [name] Hanson ([id number]) [phone].pdf Page 6 of 12 -------------------------------------------------------------------------------- Problem List Details Patient Name: [name]: [name] Hanson, [name] [name] 08/21/2022 2:30 PM Medical Record Number: [id number] Patient Account Number: [id number] Date of Birth/Sex: Treating RN: 03/16/1941 (81 y.o. M) Primary Care Provider: [name] Other Clinician: Referring Provider: Treating Provider/Extender: Benedict Needy Weeks in  Treatment: 6 Active  Problems ICD-10 Encounter Code Description Active Date MDM Diagnosis L89.512 Pressure ulcer of right ankle, stage 2 07/10/2022 No Yes N18.30 Chronic kidney disease, stage 3 unspecified 07/10/2022 No Yes I87.2 Venous insufficiency (chronic) (peripheral) 07/10/2022 No Yes L97.812 Non-pressure chronic ulcer of other part of right lower leg with fat layer 07/28/2022 No Yes exposed L97.822 Non-pressure chronic ulcer of other part of left lower leg with fat layer exposed10/02/2022 No Yes Inactive Problems ICD-10 Code Description Active Date Inactive Date L97.512 Non-pressure chronic ulcer of other part of right foot with fat layer exposed 07/10/2022 07/10/2022 T81.31XS Disruption of external operation (surgical) wound, not elsewhere classified, sequela 07/10/2022 07/10/2022 Resolved Problems Electronic Signature(s) Signed: 08/21/2022 3:45:25 PM By: Fredirick Maudlin MD FACS Entered By: Fredirick Maudlin on 08/21/2022 15:45:25 -------------------------------------------------------------------------------- Progress Note Details Patient Name: [name]: [name] Banker [name] 08/21/2022 2:30 PM Medical Record Number: [id number] Patient Account Number: [id number] Date of Birth/Sex: Treating RN: Aug 10, 1941 (82 y.o. 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 He has been getting this dressed that [employer] and he had Xeroform on this with gauze. The patient is not exactly sure how this came about but he is not able to dress the wound himself because of limitations due to what sounds like spinal stenosis and pain. He had a wound on the ankle as well but that healed over. He was seen by vascular in May 2021. This was related to a right great toe wound. He had an angiogram that was completely normal on both sides good perfusion right into the feet. He also looks like he has lymphedema and he has compression stockings but he cannot get them on and off so he essentially leaves he is on even when he showering Past medical history includes prediabetes, low back pain secondary I think the lumbar spinal stenosis has been offered a decompressive laminectomy but he has not gone forth with this. 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 ABI was noncompressible in our clinic on the right right first toe. 4/7; patient I admitted the clinic last week. He has an area on the tip of his right first toe. We cleaned this off last week and have been using silver collagen. He is in the same crocs today that he wore last week and I told him not to use. For 1 reason or another he could not handle a surgical sandal. 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 He is not a diabetic 4/12; right first toe wound chronic. We have been using silver collagen. Once again he comes in then the same pair of old crocs that he comes in every  week. 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 His x-ray suggested suspicious for osteomyelitis with bone irregularity  at the tuft of the right great toe 4/21; patient has been using silver collagen every other day to the wound on his right great toe. He continues to wear crocs. He has no complaints or issues today. 5/5; 2-week follow-up. 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 Mild soft tissue edema with enhancement at the distal aspect of the great toe suggestive of cellulitis. Fortunately the wound actually looks better. Has been using Hydrofera Blue 5/12; started him on Augmentin last week 875 twice daily he is tolerating this well. Last creatinine I see is 1.77 in early April. We are using Hydrofera Blue to the wound which actually looks better today. 5/17; tolerating the Augmentin well. He says the wound is actually been hurting him episodically. Actually the surface of this looks quite good. He did not get his lab work done. We are using [facility] he is changing this himself 6/2; still taking Augmentin he should be rounding into his last week. This was empirically for underlying osteomyelitis. The area on the tip of his toe is still open still with some depth but no palpable bone. We have been using Hydrofera Blue. Surface area of the wound has not been making much improvement Is work was essentially unremarkable except for a creatinine of 1.79. Reason for his renal insufficiency is not clear he is not a diabetic does not have hypertension however he does take NSAIDs and Lasix I wonder if this is the issue here. Note that his creatinine on 11/12/2018 was 1.45. Platelet count slightly low at 143 however his inflammatory markers were really in the normal range at 10 and 2 sedimentation rate and C-reactive protein respectively. 6/9; he apparently had another 2 weeks of Augmentin after we look that this last time. Therefore he should be coming into his last week next week. We are using polymen on the tip of the toe. He cannot get down to do the  dressing so he comes in for a nurse visit. He comes in in crocs I have warned him against this although he says he is using a different shoe for most of the day he works in [employer]. 6/16; he comes back in in crocs. I have spoken him about this before. His areas on the tip of his toe this actually looks quite good we have been using polymen. He has completed his antibiotics which we empirically gave him for underlying osteomyelitis. Everything looks better to me in spite of his noncompliance with footwear recommendation 7/11; patient has been followed through nurse visits. He was last seen by provider on 6/16. He reports improvement to the wound with the use of PolyMem. He denies signs of infection. 7/21; the patient arrives with the area on the tip of his right great toe completely healed. He has the same proximal on that I see every time. I treated him empirically with Augmentin for osteomyelitis. He needs to be vigilant about this. READMISSION 03/19/2022 The patient returns with a reopening of the right great toe wound. Apparently this occurred in around October of last year. He was followed by podiatry for some time. They have performed x-rays but unfortunately, we do not have access to them or the results. No report as to whether or not he might have osteomyelitis. In  addition, he has a new wound on the medial aspect of the right great toe as well as the anterior tibial surface of his right lower leg. He does state that he was wearing compression stockings when this tibial wound occurred. He continues to wear crocs. 04/02/2022: For some reason, the patient has not been seen in 2 weeks. He says that he had to reschedule his visit. Therefore his Coflex compression wrap was left in situ for 14 days. It appears that his skin became macerated and he has opened up several new superficial wounds on his right anterior tibial surface. We did get an x-ray to evaluate for osteomyelitis and none was  seen. All of the pre-existing wounds are smaller today and the new wounds look like they are limited to just the breakdown of skin. 04/10/2022: The wounds on his right anterior tibial surface are actually a little bit larger today. He seems to have some skin irritation and maceration in the area again. The tip of his toe is nearly closed as is the wound on the dorsal surface of his right great toe. READMISSION 07/10/2022 Since our last visit, the patient went to the operating room to have a left hip replacement, but in the preop holding area, his surgeon noted a foul odor coming from his right foot. He contacted me and described the findings of his right great toe and we mutually agreed, along with the patient, that amputation would be most appropriate. 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 Since his surgery, he has been wearing compression stockings bilaterally, but as he is unable to apply or remove them himself, he wears them for a week at a time. This appears to have resulted in a pressure injury on his right anterior ankle at the point of flexion; he has a similar looking abrasion that has not broken the skin in the same site on the left. 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 07/21/2022: The right anterior ankle wound is nearly closed aside from a small open area that has some slough accumulation. The amputation site has crusting and eschar present. Once this was removed, it was revealed that the amputation site has healed. The abrasion on the left anterior ankle at the point of flexion is no longer present. 07/28/2022: The patient dropped a door on his leg and has a new wound to his right anterior tibial surface. There is some nonviable skin hanging from the cranial portion of the wound. The fat layer is exposed. The right anterior ankle wound is a little bit smaller and has some accumulated slough  present. 08/04/2022: The wound on his right anterior tibial surface is a little bit smaller today. There is still slough accumulation. The right anterior ankle wound is quite a DAIJON, WENKE ([id number]) [id number] Page 8 of 12 bit smaller today and also has slough buildup on the surface. 08/13/2022: The patient dropped another object that struck him in the left anterior tibial surface. This has resulted in wound at this location. It is fairly superficial but does involve the fat layer. The right anterior tibial surface wound has a little bit of eschar on the surface and underneath, it is quite small. The right anterior ankle wound has also contracted but does have slough and eschar buildup as well. 08/21/2022: The right anterior tibial wound has healed. The left anterior tibial wound is quite a bit smaller and  more superficial with just a little eschar on the surface. The right anterior ankle wound also continues to contract but continues to accumulate slough and eschar, as well. Patient History Information obtained from Patient. Family History Diabetes - Maternal Grandparents, Heart Disease - Father, Hypertension - Father, Stroke - Father, Thyroid Problems - Mother,Child, No family history of Cancer, Hereditary Spherocytosis, Kidney Disease, Lung Disease, Seizures, Tuberculosis. Social History Never smoker, Marital Status - Single, Alcohol Use - Rarely, Drug Use - No History, Caffeine Use - Daily - coffee. Medical History Eyes Patient has history of Cataracts - 2019 Ear/Nose/Mouth/Throat Patient has history of Chronic sinus problems/congestion Respiratory Patient has history of Asthma, Sleep Apnea Cardiovascular Patient has history of Hypertension, Peripheral Venous Disease Endocrine Denies history of Type II Diabetes Musculoskeletal Patient has history of Osteoarthritis Medical A Surgical History  Notes nd Gastrointestinal GERD Genitourinary BPH Musculoskeletal Degenerative disc disease Neurologic Neuromuscular disorder Objective Constitutional No acute distress.. Vitals Time Taken: 2:58 PM, Height: 68 in, Weight: 245 lbs, BMI: 37.2, Temperature: 98.5 F, Pulse: 71 bpm, Respiratory Rate: 20 breaths/min, Blood Pressure: 125/62 mmHg. Respiratory Normal work of breathing on room air.. General Notes: 08/21/2022: The right anterior tibial wound has healed. The left anterior tibial wound is quite a bit smaller and more superficial with just a little eschar on the surface. The right anterior ankle wound also continues to contract but continues to accumulate slough and eschar, as well. Integumentary (Hair, Skin) Wound #10 status is Open. Original cause of wound was Skin T ear/Laceration. The date acquired was: 08/09/2022. The wound has been in treatment 1 weeks. The wound is located on the Left,Anterior Lower Leg. The wound measures 0.3cm length x 0.3cm width x 0.1cm depth; 0.071cm^2 area and 0.007cm^3 volume. There is Fat Layer (Subcutaneous Tissue) exposed. There is no tunneling or undermining noted. There is a medium amount of serosanguineous drainage noted. The wound margin is flat and intact. There is medium (34-66%) pink granulation within the wound bed. There is a medium (34-66%) amount of necrotic tissue within the wound bed including Eschar and Adherent Slough. The periwound skin appearance exhibited: Excoriation, Dry/Scaly. Periwound temperature was noted as Cool/Cold. Wound #7 status is Open. Original cause of wound was Footwear Injury. The date acquired was: 07/03/2022. The wound has been in treatment 6 weeks. The wound is located on the Right,Anterior Ankle. The wound measures 0.6cm length x 0.5cm width x 0.1cm depth; 0.236cm^2 area and 0.024cm^3 volume. There is Fat Layer (Subcutaneous Tissue) exposed. There is no tunneling or undermining noted. There is a medium amount of  serosanguineous drainage noted. The wound margin is flat and intact. There is small (1-33%) red, pink granulation within the wound bed. There is a large (67-100%) amount of necrotic tissue within the wound bed including Eschar and Adherent Slough. The periwound skin appearance exhibited: Excoriation, Dry/Scaly, Maceration. Wound #9 status is Open. Original cause of wound was Trauma. The date acquired was: 07/24/2022. The wound has been in treatment 3 weeks. The wound is located on the Right,Anterior Lower Leg. The wound measures 0.1cm length x 0.1cm width x 0.1cm depth; 0.008cm^2 area and 0.001cm^3 volume. There is Fat Layer (Subcutaneous Tissue) exposed. There is no tunneling or undermining noted. The wound margin is flat and intact. There is medium (34-66%) pink granulation within the wound bed. There is a medium (34-66%) amount of necrotic tissue within the wound bed including Eschar and Adherent Slough. The periwound skin appearance exhibited: Dry/Scaly. The periwound skin appearance did not exhibit: Excoriation, Maceration.  Periwound temperature was noted as Cool/Cold. [name] Hanson, [name] Hanson ([id number]) [id number] Page 9 of 12 Assessment Active Problems ICD-10 Pressure ulcer of right ankle, stage 2 Chronic kidney disease, stage 3 unspecified Venous insufficiency (chronic) (peripheral) Non-pressure chronic ulcer of other part of right lower leg with fat layer exposed Non-pressure chronic ulcer of other part of left lower leg with fat layer exposed Procedures Wound #10 Pre-procedure diagnosis of Wound #10 is a Trauma, Other located on the Left,Anterior Lower Leg . There was a Selective/Open Wound Non-Viable Tissue Debridement with a total area of 0.09 sq cm performed by Fredirick Maudlin, MD. With the following instrument(s): Curette to remove Non-Viable tissue/material. Material removed includes Eschar after achieving pain control using Lidocaine 5% topical ointment. No  specimens were taken. A time out was conducted at 15:41, prior to the start of the procedure. A Minimum amount of bleeding was controlled with Pressure. The procedure was tolerated well with a pain level of 0 throughout and a pain level of 0 following the procedure. Post Debridement Measurements: 0.3cm length x 0.3cm width x 0.1cm depth; 0.007cm^3 volume. Character of Wound/Ulcer Post Debridement is improved. Post procedure Diagnosis Wound #10: Same as Pre-Procedure General Notes: Scribed for Dr.Kaitlen Redford by J.Scotton. Pre-procedure diagnosis of Wound #10 is a Trauma, Other located on the Left,Anterior Lower Leg . There was a Three Layer Compression Therapy Procedure by [name], RN. Post procedure Diagnosis Wound #10: Same as Pre-Procedure Wound #7 Pre-procedure diagnosis of Wound #7 is a Venous Leg Ulcer located on the Right,Anterior Ankle .Severity of Tissue Pre Debridement is: Fat layer exposed. There was a Selective/Open Wound Non-Viable Tissue Debridement with a total area of 0.3 sq cm performed by Fredirick Maudlin, MD. With the following instrument(s): Curette to remove Non-Viable tissue/material. Material removed includes Eschar and Slough and after achieving pain control using Lidocaine 5% topical ointment. A time out was conducted at 15:41, prior to the start of the procedure. A Minimum amount of bleeding was controlled with Pressure. The procedure was tolerated well with a pain level of 0 throughout and a pain level of 0 following the procedure. Post Debridement Measurements: 0.6cm length x 0.5cm width x 0.1cm depth; 0.024cm^3 volume. Character of Wound/Ulcer Post Debridement is improved. Severity of Tissue Post Debridement is: Fat layer exposed. Post procedure Diagnosis Wound #7: Same as Pre-Procedure General Notes: Scribed for Dr. Celine Ahr by J.Scotton. Pre-procedure diagnosis of Wound #7 is a Venous Leg Ulcer located on the Right,Anterior Ankle . There was a Three Layer  Compression Therapy Procedure by [name], RN. Post procedure Diagnosis Wound #7: Same as Pre-Procedure Plan Follow-up Appointments: Return Appointment in 1 week. - Dr. Celine Ahr- Rm 4 Anesthetic: Wound #7 Right,Anterior Ankle: (In clinic) Topical Lidocaine 4% applied to wound bed - in clinic, prior to debridement Bathing/ Shower/ Hygiene: May shower and wash wound with soap and water. Edema Control - Lymphedema / SCD / Other: Elevate legs to the level of the heart or above for 30 minutes daily and/or when sitting, a frequency of: - throughout the day Avoid standing for long periods of time. Exercise regularly Moisturize legs daily. - both legs nightly WOUND #10: - Lower Leg Wound Laterality: Left, Anterior Cleanser: Soap and Water 1 x Per Week/ Discharge Instructions: May shower and wash wound with dial antibacterial soap and water prior to dressing change. Prim Dressing: KerraCel Ag Gelling Fiber Dressing, 2x2 in (silver alginate) 1 x Per Week/ ary Discharge Instructions: Apply silver alginate to wound bed as instructed Secondary Dressing: ALLEVYN  Gentle Border, 4x4 (in/in) 1 x Per Week/ Discharge Instructions: Apply over primary dressing as directed. Com pression Wrap: ThreePress (3 layer compression wrap) 1 x Per Week/ Discharge Instructions: Apply three layer compression as directed. WOUND #7: - Ankle Wound Laterality: Right, Anterior Cleanser: Soap and Water 1 x Per Week/ Discharge Instructions: May shower and wash wound with dial antibacterial soap and water prior to dressing change. Prim Dressing: KerraCel Ag Gelling Fiber Dressing, 2x2 in (silver alginate) 1 x Per Week/ ary Discharge Instructions: Apply silver alginate to wound bed as instructed Secondary Dressing: ALLEVYN Gentle Border, 4x4 (in/in) 1 x Per Week/ Discharge Instructions: Apply over primary dressing as directed. Com pression Wrap: ThreePress (3 layer compression wrap) 1 x Per Week/ Discharge  Instructions: Apply three layer compression as directed. [name] Hanson, [name] Hanson ([id number]) [id number] Page 10 of 12 WOUND #9: - Lower Leg Wound Laterality: Right, Anterior Cleanser: Soap and Water 1 x Per Week/30 Days Discharge Instructions: May shower and wash wound with dial antibacterial soap and water prior to dressing change. Prim Dressing: KerraCel Ag Gelling Fiber Dressing, 2x2 in (silver alginate) 1 x Per Week/30 Days ary Discharge Instructions: Apply silver alginate to wound bed as instructed Secondary Dressing: ALLEVYN Gentle Border, 4x4 (in/in) 1 x Per Week/30 Days Discharge Instructions: Apply over primary dressing as directed. Com pression Wrap: ThreePress (3 layer compression wrap) 1 x Per Week/30 Days Discharge Instructions: Apply three layer compression as directed. 08/21/2022: The right anterior tibial wound has healed. The left anterior tibial wound is quite a bit smaller and more superficial with just a little eschar on the surface. The right anterior ankle wound also continues to contract but continues to accumulate slough and eschar, as well. I used a curette to debride eschar from the left anterior tibial wound and slough from the right anterior ankle wound. We will continue to use silver alginate to both wounds with bilateral 3 layer compression wraps. Follow-up in 1 week. Electronic Signature(s) Signed: 08/21/2022 3:53:33 PM By: Fredirick Maudlin MD FACS Entered By: Fredirick Maudlin on 08/21/2022 15:53:33 -------------------------------------------------------------------------------- HxROS Details Patient Name: [name]: [name] Banker [name] 08/21/2022 2:30 PM Medical Record Number: [id number] Patient Account Number: [id number] Date of Birth/Sex: Treating RN: May 05, 1941 (81 y.o. M) Primary Care Provider: [name] Other Clinician: Referring Provider: Treating Provider/Extender: Benedict Needy Weeks in  Treatment: 6 Information Obtained From Patient Eyes Medical History: Positive for: Cataracts - 2019 Ear/Nose/Mouth/Throat Medical History: Positive for: Chronic sinus problems/congestion Respiratory Medical History: Positive for: Asthma; Sleep Apnea Cardiovascular Medical History: Positive for: Hypertension; Peripheral Venous Disease Gastrointestinal Medical History: Past Medical History Notes: GERD Endocrine Medical History: Negative for: Type II Diabetes Genitourinary Medical History: Past Medical History NotesFRANCISCA, HARBUCK ([id number]) [id number] Page 11 of 12 BPH Musculoskeletal Medical History: Positive for: Osteoarthritis Past Medical History Notes: Degenerative disc disease Neurologic Medical History: Past Medical History Notes: Neuromuscular disorder HBO Extended History Items Ear/Nose/Mouth/Throat: Eyes: Chronic sinus Cataracts problems/congestion Immunizations Pneumococcal Vaccine: Received Pneumococcal Vaccination: Yes Received Pneumococcal Vaccination On or After 60th Birthday: No Implantable Devices None Family and Social History Cancer: No; Diabetes: Yes - Maternal Grandparents; Heart Disease: Yes - Father; Hereditary Spherocytosis: No; Hypertension: Yes - Father; Kidney Disease: No; Lung Disease: No; Seizures: No; Stroke: Yes - Father; Thyroid Problems: Yes - Mother,Child; Tuberculosis: No; Never smoker; Marital Status - Single; Alcohol Use: Rarely; Drug Use: No History; Caffeine Use: Daily - coffee; Financial Concerns: No; Food, Clothing or Shelter Needs: No; Support System Lacking:  No; Transportation Concerns: No Electronic Signature(s) Signed: 08/21/2022 4:11:25 PM By: Fredirick Maudlin MD FACS Entered By: Fredirick Maudlin on 08/21/2022 15:46:49 -------------------------------------------------------------------------------- SuperBill Details Patient Name: [name]: [name] Banker [name]  08/21/2022 Medical Record Number: [id number] Patient Account Number: [id number] Date of Birth/Sex: Treating RN: 06-01-1941 (81 y.o. M) Primary Care Provider: [name] Other Clinician: Referring Provider: Treating Provider/Extender: Benedict Needy Weeks in Treatment: 6 Diagnosis Coding ICD-10 Codes Code Description [phone] Pressure ulcer of right ankle, stage 2 N18.30 Chronic kidney disease, stage 3 unspecified I87.2 Venous insufficiency (chronic) (peripheral) L97.812 Non-pressure chronic ulcer of other part of right lower leg with fat layer exposed L97.822 Non-pressure chronic ulcer of other part of left lower leg with fat layer exposed Facility Procedures : [name] Hanson, [name] Hanson Code: [id number] Debbora Dus [phone] Description: [phone] - DEBRIDE WOUND 1ST 20 SQ CM OR < ICD-10 Diagnosis Description L89.512 Pressure ulcer of right ankle, stage 2 L97.822 Non-pressure chronic ulcer of other part of left lower leg with fat layer expose 28) [phone] Modifier: [id number] Quantity: 1 7.pdf Page 12 of 12 Physician Procedures : CPT4 Code Description Modifier [id number] - WC PHYS LEVEL 3 - EST PT 25 ICD-10 Diagnosis Description L97.822 Non-pressure chronic ulcer of other part of left lower leg with fat layer exposed L89.512 Pressure ulcer of right ankle, stage 2 I87.2  Venous insufficiency (chronic) (peripheral) N18.30 Chronic kidney disease, stage 3 unspecified Quantity: 1 : [id number] - WC PHYS DEBR WO ANESTH 20 SQ CM ICD-10 Diagnosis Description L89.512 Pressure ulcer of right ankle, stage 2 L97.822 Non-pressure chronic ulcer of other part of left lower leg with fat layer exposed Quantity: 1 Electronic Signature(s) Signed: 08/21/2022 3:54:10 PM By: Fredirick Maudlin MD FACS Entered By: Fredirick Maudlin on 08/21/2022 15:54:10

## 2022-08-27 ENCOUNTER — Encounter (HOSPITAL_BASED_OUTPATIENT_CLINIC_OR_DEPARTMENT_OTHER): Payer: Medicare Other | Admitting: General Surgery

## 2022-08-27 DIAGNOSIS — S81812A Laceration without foreign body, left lower leg, initial encounter: Secondary | ICD-10-CM | POA: Diagnosis not present

## 2022-08-27 DIAGNOSIS — L97812 Non-pressure chronic ulcer of other part of right lower leg with fat layer exposed: Secondary | ICD-10-CM | POA: Diagnosis not present

## 2022-08-27 DIAGNOSIS — I890 Lymphedema, not elsewhere classified: Secondary | ICD-10-CM | POA: Diagnosis not present

## 2022-08-27 DIAGNOSIS — M86179 Other acute osteomyelitis, unspecified ankle and foot: Secondary | ICD-10-CM | POA: Diagnosis not present

## 2022-08-27 DIAGNOSIS — L89512 Pressure ulcer of right ankle, stage 2: Secondary | ICD-10-CM | POA: Diagnosis not present

## 2022-08-27 DIAGNOSIS — M1990 Unspecified osteoarthritis, unspecified site: Secondary | ICD-10-CM | POA: Diagnosis not present

## 2022-08-27 DIAGNOSIS — L97312 Non-pressure chronic ulcer of right ankle with fat layer exposed: Secondary | ICD-10-CM | POA: Diagnosis not present

## 2022-08-27 DIAGNOSIS — L97822 Non-pressure chronic ulcer of other part of left lower leg with fat layer exposed: Secondary | ICD-10-CM | POA: Diagnosis not present

## 2022-08-27 NOTE — Progress Notes (Signed)
[name] ([id number]) [id number] Page 1 of 9 Visit Report for 08/27/2022 Arrival Information Details Patient Name: [name]: [name], [name] [name] 08/27/2022 11:00 A M Medical Record Number: [id number] Patient Account Number: [id number] Date of Birth/Sex: Treating RN: 1941/09/16 (81 y.o. [name] Primary Care [name]: [name] Other Clinician: Referring [name]: Treating [name]/Extender: [name] Weeks in Treatment: 6 Visit Information History Since Last Visit Added or deleted any medications: No Patient Arrived: [name] Any new allergies or adverse reactions: No Arrival Time: 11:23 Had a fall or experienced change in No Accompanied By: self activities of daily living that may affect Transfer Assistance: None risk of falls: Patient Identification Verified: Yes Signs or symptoms of abuse/neglect since last visito No Patient Requires Transmission-Based Precautions: No Hospitalized since last visit: No Patient Has Alerts: Yes Implantable device outside of the clinic excluding No Patient Alerts: ABI LLE 1.16, RLE 1.14 cellular tissue based products placed in the center since last visit: Has Dressing in Place as Prescribed: Yes Has Compression in Place as Prescribed: Yes Pain Present Now: Yes Electronic Signature(s) Signed: 08/27/2022 5:18:16 PM By: [name] Catholic RN Entered By: [name] on 08/27/2022 11:23:09 -------------------------------------------------------------------------------- Compression Therapy Details Patient Name: [name]: [name] [name] 08/27/2022 11:00 A M Medical Record Number: [id number] Patient Account Number: [id number] Date of Birth/Sex: Treating RN: [birth year] (81 y.o. [name] Primary Care [name]: [name] Other Clinician: Referring Lisette Mancebo: Treating [name]/Extender: [name] Weeks in Treatment:  6 Compression Therapy Performed for Wound Assessment: Wound #7 Right,Anterior Ankle Performed By: Clinician [name] Catholic, RN Compression Type: Three Layer Post Procedure Diagnosis Same as Pre-procedure Electronic Signature(s) Signed: 08/27/2022 5:18:16 PM By: [name] Catholic RN Entered By: [name] on 08/27/2022 14:10:25 [name] ([id number]) [id number] Page 2 of 9 -------------------------------------------------------------------------------- Encounter Discharge Information Details Patient Name: [name]: [name], [name] [name] 08/27/2022 11:00 A M Medical Record Number: [id number] Patient Account Number: [id number] Date of Birth/Sex: Treating RN: [birth year] (80 y.o. [name] Primary Care [name]: [name] Other Clinician: Referring [name]: Treating [name]/Extender: [name] Weeks in Treatment: 6 Encounter Discharge Information Items Post Procedure Vitals Discharge Condition: Stable Temperature (F): 97.8 Ambulatory Status: Cane Pulse (bpm): 95 Discharge Destination: Home Respiratory Rate (breaths/min): 20 Transportation: Private Auto Blood Pressure (mmHg): 129/76 Accompanied By: self Schedule Follow-up Appointment: Yes Clinical Summary of Care: Patient Declined Electronic Signature(s) Signed: 08/27/2022 5:18:16 PM By: [name] Catholic RN Entered By: [name] on 08/27/2022 17:17:53 -------------------------------------------------------------------------------- Lower Extremity Assessment Details Patient Name: [name]: [name] [name] 08/27/2022 11:00 A M Medical Record Number: [id number] Patient Account Number: [id number] Date of Birth/Sex: Treating RN: 03-23-1941 (81 y.o. [name] Primary Care Phelix Fudala: [name] Other Clinician: Referring [name]: Treating [name]/Extender: [name] Weeks in Treatment: 6 Edema  Assessment Assessed: [name]: No] [name]: No] [Left: Edema] [Right: :] Calf Left: Right: Point of Measurement: From Medial Instep 36.5 cm 41 cm Ankle Left: Right: Point of Measurement: From Medial Instep 24.5 cm 25 cm Vascular Assessment Pulses: Dorsalis Pedis Palpable: [Left:Yes] [Right:Yes] Electronic Signature(s) Signed: 08/27/2022 5:18:16 PM By: [name] Catholic RN Entered By: [name] on 08/27/2022 11:24:50 Multi Wound Chart Details -------------------------------------------------------------------------------- [name] ([id number]) [id number] Page 3 of 9 Patient Name: [name]: [name], [name] [name] 08/27/2022 11:00 A M Medical Record Number: [id number] Patient Account Number: [id number] Date of Birth/Sex: Treating RN: 11-27-1940 [id number]  y.o. [name] Primary Care [name]: [name] Other Clinician: Referring [name]: Treating [name]/Extender: [name] Weeks in Treatment: 6 Vital Signs Height(in): 59 Pulse(bpm): 95 Weight(lbs): 245 Blood Pressure(mmHg): 129/76 Body Mass Index(BMI): 37.2 Temperature(F): 97.8 Respiratory Rate(breaths/min): 20 Wound Assessments Wound Number: 10 7 N/A Photos: N/A Left, Anterior Lower Leg Right, Anterior Ankle N/A Wound Location: Skin T ear/Laceration Footwear Injury N/A Wounding Event: Trauma, Other Venous Leg Ulcer N/A Primary Etiology: Cataracts, Chronic sinus Cataracts, Chronic sinus N/A Comorbid History: problems/congestion, Asthma, Sleep problems/congestion, Asthma, Sleep Apnea, Hypertension, Peripheral Apnea, Hypertension, Peripheral Venous Disease, Osteoarthritis Venous Disease, Osteoarthritis 08/09/2022 07/03/2022 N/A Date Acquired: 2 6 N/A Weeks of Treatment: Open Open N/A Wound Status: No No N/A Wound Recurrence: 0.1x0.1x0.1 0.1x0.1x0.1 N/A Measurements L x W x D (cm) 0.008 0.008 N/A A (cm) : rea 0.001 0.001 N/A Volume (cm)  : 99.70% 99.40% N/A % Reduction in Area: 99.70% 99.30% N/A % Reduction in Volume: Full Thickness Without Exposed Full Thickness Without Exposed N/A Classification: Support Structures Support Structures Medium Medium N/A Exudate A mount: Serosanguineous Serosanguineous N/A Exudate Type: red, brown red, brown N/A Exudate Color: Flat and Intact Flat and Intact N/A Wound Margin: Small (1-33%) Small (1-33%) N/A Granulation Amount: Pink Red, Pink N/A Granulation Quality: Large (67-100%) Large (67-100%) N/A Necrotic Amount: Eschar, Adherent Slough Eschar, Adherent Slough N/A Necrotic Tissue: Fat Layer (Subcutaneous Tissue): Yes Fat Layer (Subcutaneous Tissue): Yes N/A Exposed Structures: Fascia: No Fascia: No Tendon: No Tendon: No Muscle: No Muscle: No Joint: No Joint: No Bone: No Bone: No None Small (1-33%) N/A Epithelialization: Excoriation: Yes Excoriation: No N/A Periwound Skin Texture: Induration: No Callus: No Crepitus: No Rash: No Scarring: No Dry/Scaly: Yes Maceration: No N/A Periwound Skin Moisture: Dry/Scaly: No Hemosiderin Staining: Yes N/A Periwound Skin Color: Atrophie Blanche: No Cyanosis: No Ecchymosis: No Erythema: No Mottled: No Pallor: No Rubor: No Cool/Cold No Abnormality N/A Temperature: Treatment Notes Electronic Signature(s) Signed: 08/27/2022 11:50:01 AM By: Fredirick Maudlin MD FACS [name] ([id number]) [id number] Page 4 of 9 Signed: 08/27/2022 5:18:16 PM By: [name] Catholic RN Entered By: Fredirick Maudlin on 08/27/2022 11:50:01 -------------------------------------------------------------------------------- Multi-Disciplinary Care Plan Details Patient Name: [name]: [name] [name] 08/27/2022 11:00 A M Medical Record Number: [id number] Patient Account Number: [id number] Date of Birth/Sex: Treating RN: 1941/03/06 (81 y.o. [name] Primary Care Sadarius [name]: [name]  Other Clinician: Referring Minnette Merida: Treating Gilberto Stanforth/Extender: [name] Weeks in Treatment: 6 Active Inactive Venous Leg Ulcer Nursing Diagnoses: Potential for venous Insuffiency (use before diagnosis confirmed) Goals: Patient will maintain optimal edema control Date Initiated: 07/10/2022 Target Resolution Date: 11/09/2022 Goal Status: Active Interventions: Assess peripheral edema status every visit. Treatment Activities: Therapeutic compression applied : 07/10/2022 Notes: Wound/Skin Impairment Nursing Diagnoses: Impaired tissue integrity Goals: Ulcer/skin breakdown will have a volume reduction of 30% by week 4 Date Initiated: 07/10/2022 Date Inactivated: 08/13/2022 Target Resolution Date: 08/13/2022 Goal Status: Met Ulcer/skin breakdown will have a volume reduction of 50% by week 8 Date Initiated: 08/13/2022 Target Resolution Date: 11/09/2022 Goal Status: Active Interventions: Provide education on ulcer and skin care Treatment Activities: Skin care regimen initiated : 07/10/2022 Topical wound management initiated : 07/10/2022 Notes: Electronic Signature(s) Signed: 08/27/2022 5:18:16 PM By: [name] Catholic RN Entered By: [name] on 08/27/2022 17:15:00 [name] ([id number]) [id number] Page 5 of 9 -------------------------------------------------------------------------------- Pain Assessment Details Patient Name: [name]: [name], [name] [name] 08/27/2022 11:00 A M Medical Record Number: [id number] Patient Account Number:  [id number] Date of Birth/Sex: Treating RN: [birth year] (81 y.o. [name] Primary Care Aalia Greulich: [name] Other Clinician: Referring Kinser Fellman: Treating Amare Kontos/Extender: [name] Weeks in Treatment: 6 Active Problems Location of Pain Severity and Description of Pain Patient Has Paino Yes Site Locations Pain Location: Generalized Pain With  Dressing Change: No Duration of the Pain. Constant / Intermittento Constant Rate the pain. Current Pain Level: 8 Worst Pain Level: 10 Least Pain Level: 5 Tolerable Pain Level: 3 Character of Pain Describe the Pain: Tender Pain Management and Medication Current Pain Management: Medication: Yes Cold Application: No Rest: Yes Massage: No Activity: No T.E.N.S.: No Heat Application: No Leg drop or elevation: No Is the Current Pain Management Adequate: Adequate How does your wound impact your activities of daily livingo Sleep: No Bathing: No Appetite: No Relationship With Others: No Bladder Continence: No Emotions: No Bowel Continence: No Work: No Toileting: No Drive: No Dressing: No Hobbies: No Electronic Signature(s) Signed: 08/27/2022 5:18:16 PM By: [name] Catholic RN Entered By: [name] on 08/27/2022 11:24:42 -------------------------------------------------------------------------------- Patient/Caregiver Education Details Patient Name: [name]: [name] [name] 10/18/2023andnbsp11:00 A M Medical Record Number: [id number] Patient Account Number: [id number] Date of Birth/Gender: Treating RN: 10-Feb-1941 (81 y.o. [name] Primary Care Physician: [name] Other Clinician: Referring Physician: Treating Physician/Extender: Leonard Downing in Treatment: 6 Education Assessment [name] ([id number]) [id number] Page 6 of 9 Education Provided To: Patient Education Topics Provided Wound/Skin Impairment: Methods: Explain/Verbal Responses: Return demonstration correctly Electronic Signature(s) Signed: 08/27/2022 5:18:16 PM By: [name] Catholic RN Entered By: [name] on 08/27/2022 17:15:35 -------------------------------------------------------------------------------- Wound Assessment Details Patient Name: [name]: [name] [name] 08/27/2022 11:00 A M Medical  Record Number: [id number] Patient Account Number: [id number] Date of Birth/Sex: Treating RN: 1941-02-14 (81 y.o. [name] Primary Care Altamese Deguire: [name] Other Clinician: Referring Vaani Morren: Treating Brieonna Crutcher/Extender: [name] Weeks in Treatment: 6 Wound Status Wound Number: 10 Primary Trauma, Other Etiology: Wound Location: Left, Anterior Lower Leg Wound Healed - Epithelialized Wounding Event: Skin Tear/Laceration Status: Date Acquired: 08/09/2022 Comorbid Cataracts, Chronic sinus problems/congestion, Asthma, Sleep Weeks Of Treatment: 2 History: Apnea, Hypertension, Peripheral Venous Disease, Osteoarthritis Clustered Wound: No Photos Wound Measurements Length: (cm) Width: (cm) Depth: (cm) Area: (cm) Volume: (cm) 0 % Reduction in Area: 100% 0 % Reduction in Volume: 100% 0 Epithelialization: Large (67-100%) 0 Tunneling: No 0 Undermining: No Wound Description Classification: Full Thickness Without Exposed Support Wound Margin: Flat and Intact Exudate Amount: None Present Structures Foul Odor After Cleansing: No Slough/Fibrino No Wound Bed Granulation Amount: None Present (0%) Exposed Structure Necrotic Amount: None Present (0%) Fascia Exposed: No Fat Layer (Subcutaneous Tissue) Exposed: No Tendon Exposed: No Muscle Exposed: No Joint Exposed: No [name], [name] ([id number]) [id number] Page 7 of 9 Bone Exposed: No Periwound Skin Texture Texture Color No Abnormalities Noted: Yes No Abnormalities Noted: Yes Moisture Temperature / Pain No Abnormalities Noted: Yes Temperature: No Abnormality Electronic Signature(s) Signed: 08/27/2022 5:18:16 PM By: [name] Catholic RN Entered By: [name] on 08/27/2022 11:50:52 -------------------------------------------------------------------------------- Wound Assessment Details Patient Name: [name]: [name] [name] 08/27/2022 11:00 A  M Medical Record Number: [id number] Patient Account Number: [id number] Date of Birth/Sex: Treating RN: 04-12-1941 (81 y.o. [name] Primary Care Kye Hedden: [name] Other Clinician: Referring Javi Bollman: Treating Elvin Hanson/Extender: [name] Weeks in Treatment: 6 Wound Status Wound Number: 7 Primary Venous Leg Ulcer Etiology:  Wound Location: Right, Anterior Ankle Wound Open Wounding Event: Footwear Injury Status: Date Acquired: 07/03/2022 Comorbid Cataracts, Chronic sinus problems/congestion, Asthma, Sleep Weeks Of Treatment: 6 History: Apnea, Hypertension, Peripheral Venous Disease, Osteoarthritis Clustered Wound: No Photos Wound Measurements Length: (cm) 0.1 Width: (cm) 0.1 Depth: (cm) 0.1 Area: (cm) 0.008 Volume: (cm) 0.001 % Reduction in Area: 99.4% % Reduction in Volume: 99.3% Epithelialization: Small (1-33%) Tunneling: No Undermining: No Wound Description Classification: Full Thickness Without Exposed Suppor Wound Margin: Flat and Intact Exudate Amount: Medium Exudate Type: Serosanguineous Exudate Color: red, brown t Structures Foul Odor After Cleansing: No Slough/Fibrino Yes Wound Bed Granulation Amount: Small (1-33%) Exposed Structure Granulation Quality: Red, Pink Fascia Exposed: No Necrotic Amount: Large (67-100%) Fat Layer (Subcutaneous Tissue) Exposed: Yes Necrotic Quality: Eschar, Adherent Slough Tendon Exposed: No Muscle Exposed: No Joint Exposed: No Bone Exposed: No [name], [name] ([id number]) [id number] Page 8 of 9 Periwound Skin Texture Texture Color No Abnormalities Noted: No No Abnormalities Noted: No Callus: No Atrophie Blanche: No Crepitus: No Cyanosis: No Excoriation: No Ecchymosis: No Induration: No Erythema: No Rash: No Hemosiderin Staining: Yes Scarring: No Mottled: No Pallor: No Moisture Rubor: No No Abnormalities Noted: No Dry / Scaly: No Temperature /  Pain Maceration: No Temperature: No Abnormality Treatment Notes Wound #7 (Ankle) Wound Laterality: Right, Anterior Cleanser Soap and Water Discharge Instruction: May shower and wash wound with dial antibacterial soap and water prior to dressing change. Peri-Wound Care Topical Primary Dressing KerraCel Ag Gelling Fiber Dressing, 2x2 in (silver alginate) Discharge Instruction: Apply silver alginate to wound bed as instructed Secondary Dressing ALLEVYN Gentle Border, 4x4 (in/in) Discharge Instruction: Apply over primary dressing as directed. Secured With Compression Wrap ThreePress (3 layer compression wrap) Discharge Instruction: Apply three layer compression as directed. Compression Stockings Add-Ons Electronic Signature(s) Signed: 08/27/2022 5:18:16 PM By: [name] Catholic RN Entered By: [name] on 08/27/2022 11:35:21 -------------------------------------------------------------------------------- Vitals Details Patient Name: [name]: [name] [name] 08/27/2022 11:00 A M Medical Record Number: [id number] Patient Account Number: [id number] Date of Birth/Sex: Treating RN: [birth year] (81 y.o. [name] Primary Care Norvin Ohlin: [name] Other Clinician: Referring Karrisa Didio: Treating Magalene Mclear/Extender: [name] Weeks in Treatment: 6 Vital Signs Time Taken: 11:23 Temperature (F): 97.8 Height (in): 68 Pulse (bpm): 95 Weight (lbs): 245 Respiratory Rate (breaths/min): 20 Body Mass Index (BMI): 37.2 Blood Pressure (mmHg): 129/76 Reference Range: 80 - 120 mg / dl Electronic Signature(s) [name], [name] ([id number]) [id number] Page 9 of 9 Signed: 08/27/2022 5:18:16 PM By: [name] Catholic RN Entered By: [name] on 08/27/2022 11:36:27

## 2022-08-27 NOTE — Progress Notes (Signed)
KERMITT, HARJO ([id number]) [id number] Page 1 of 10 Visit Report for 08/27/2022 Chief Complaint Document Details Patient Name: [name]: [name], [name] [name] 08/27/2022 11:00 A M Medical Record Number: [id number] Patient Account Number: [id number] Date of Birth/Sex: Treating RN: [birth year] (81 y.o. 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 08/27/2022 11:00 A M Medical Record Number: [id number] Patient Account Number: [id number] Date of Birth/Sex: Treating RN: 1941-05-25 (81 y.o. [name] Primary Care Provider: [name] Other Clinician: Referring Provider: Treating Provider/Extender: [name] Weeks in Treatment: 6 Debridement Performed for Assessment: Wound #7 Right,Anterior Ankle Performed By: Physician Ricard Dillon., MD Debridement Type: Debridement Severity of Tissue Pre Debridement: Fat layer exposed Level of Consciousness (Pre-procedure): Awake and Alert Pre-procedure Verification/Time Out Yes - 11:40 Taken: Start Time: 11:40 Pain Control: Lidocaine  5% topical ointment T Area Debrided (L x W): otal 0.1 (cm) x 0.1 (cm) = 0.01 (cm) Tissue and other material debrided: Non-Viable, Eschar, Slough, Slough Level: Non-Viable Tissue Debridement Description: Selective/Open Wound Instrument: Curette Bleeding: Minimum Hemostasis Achieved: Pressure End Time: 11:41 Procedural Pain: 0 Post Procedural Pain: 0 Response to Treatment: Procedure was tolerated well Level of Consciousness (Post- Awake and Alert procedure): Post Debridement Measurements of Total Wound Length: (cm) 0.1 Width: (cm) 0.1 [name], [name] ([id number]) [id number] Page 2 of 10 Depth: (cm) 0.1 Volume: (cm) 0.001 Character of Wound/Ulcer Post Debridement: Improved Severity of Tissue Post Debridement: Fat layer exposed Post Procedure Diagnosis Same as Pre-procedure Notes Scribed for Dr. Celine Ahr by J.Scotton Electronic Signature(s) Signed: 08/27/2022 4:28:13 PM By: Linton Ham MD Signed: 08/27/2022 5:18:16 PM By: [name] RN Entered By: [name] on 08/27/2022 12:04:38 -------------------------------------------------------------------------------- HPI Details Patient Name: [name]: [name] [name] 08/27/2022 11:00 A M Medical Record Number: [id number] Patient Account Number: [id number] Date of Birth/Sex: Treating RN: 1941-06-04 (81 y.o. [name] Primary Care Provider: [name] Other Clinician: Referring Provider: Treating Provider/Extender: [name] Weeks in Treatment: 6 History of Present Illness HPI Description: ADMISSION 02/07/2021 This is a 81 year old man who is not a diabetic "prediabetic" who comes in today letter asking [place] to look at an area on his right first toe tip that has been there about a year. He has been getting this dressed that [employer] and he had Xeroform on this with gauze. The patient is not exactly sure how this came about but he is not able to  dress the wound himself because of limitations due to what sounds like spinal stenosis and pain. He had a wound on the ankle as well but that healed over. He was seen by vascular in May 2021. This was related to a right great toe wound. He had an angiogram that was completely normal on both sides good perfusion right into the  feet. He also looks like he has lymphedema and he has compression stockings but he cannot get them on and off so he essentially leaves he is on even when he showering Past medical history includes prediabetes, low back pain secondary I think the lumbar spinal stenosis has been offered a decompressive laminectomy but he has not gone forth with this. 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 ABI was noncompressible in our clinic on the right right first toe. 4/7; patient I admitted the clinic last week. He has an area on the tip of his right first toe. We cleaned this off last week and have been using silver collagen. He is in the same crocs today that he wore last week and I told him not to use. For 1 reason or another he could not handle a surgical sandal. 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 He is not a diabetic 4/12; right first toe wound chronic. We have been using silver collagen. Once again he comes in then the same pair of old crocs that he comes in every week. 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 His x-ray suggested suspicious for osteomyelitis with bone irregularity at the tuft of the right great toe 4/21; patient has been using silver collagen every other day to the wound on his right great toe. He continues to wear crocs. He has no complaints or issues today. 5/5; 2-week follow-up. 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 Mild soft tissue edema with enhancement  at the distal aspect of the great toe suggestive of cellulitis. Fortunately the wound actually looks better. Has been using Hydrofera Blue 5/12; started him on Augmentin last week 875 twice daily he is tolerating this well. Last creatinine I see is 1.77 in early April. We are using Hydrofera Blue to the wound which actually looks better today. 5/17; tolerating the Augmentin well. He says the wound is actually been hurting him episodically. Actually the surface of this looks quite good. He did not get his lab work done. We are using [facility] he is changing this himself 6/2; still taking Augmentin he should be rounding into his last week. This was empirically for underlying osteomyelitis. The area on the tip of his toe is still open still with some depth but no palpable bone. We have been using Hydrofera Blue. Surface area of the wound has not been making much improvement Is work was essentially unremarkable except for a creatinine of 1.79. Reason for his renal insufficiency is not clear he is not a diabetic does not have hypertension however he does take NSAIDs and Lasix I wonder if this is the issue here. Note that his creatinine on 11/12/2018 was 1.45. Platelet count slightly low at 143 however his inflammatory markers were really in the normal range at 10 and 2 sedimentation rate and C-reactive protein respectively. 6/9; he apparently had another 2 weeks of Augmentin after we look that this last time. Therefore he should be coming into his last week next week. We are using polymen on the tip of the toe. He cannot get down to do the dressing so he comes in for a nurse visit. He comes in in crocs I have warned him against this although he says he is using a different shoe for  most of the day he works in [employer]. 6/16; he comes back in in crocs. I have spoken him about this before. His areas on the tip of his toe this actually looks quite good we have been using polymen. He has completed his  antibiotics which we empirically gave him for underlying osteomyelitis. Everything looks better to me in spite of his noncompliance with footwear recommendation 7/11; patient has been followed through nurse visits. He was last seen by provider on 6/16. He reports improvement to the wound with the use of PolyMem. He denies signs of infection. 7/21; the patient arrives with the area on the tip of his right great toe completely healed. He has the same proximal on that I see every time. I treated him empirically with Augmentin for osteomyelitis. He needs to be vigilant about this. [name], [name] ([id number]) [id number] Page 3 of 10 READMISSION 03/19/2022 The patient returns with a reopening of the right great toe wound. Apparently this occurred in around October of last year. He was followed by podiatry for some time. They have performed x-rays but unfortunately, we do not have access to them or the results. No report as to whether or not he might have osteomyelitis. In addition, he has a new wound on the medial aspect of the right great toe as well as the anterior tibial surface of his right lower leg. He does state that he was wearing compression stockings when this tibial wound occurred. He continues to wear crocs. 04/02/2022: For some reason, the patient has not been seen in 2 weeks. He says that he had to reschedule his visit. Therefore his Coflex compression wrap was left in situ for 14 days. It appears that his skin became macerated and he has opened up several new superficial wounds on his right anterior tibial surface. We did get an x-ray to evaluate for osteomyelitis and none was seen. All of the pre-existing wounds are smaller today and the new wounds look like they are limited to just the breakdown of skin. 04/10/2022: The wounds on his right anterior tibial surface are actually a little bit larger today. He seems to have some skin irritation and maceration in the  area again. The tip of his toe is nearly closed as is the wound on the dorsal surface of his right great toe. READMISSION 07/10/2022 Since our last visit, the patient went to the operating room to have a left hip replacement, but in the preop holding area, his surgeon noted a foul odor coming from his right foot. He contacted me and described the findings of his right great toe and we mutually agreed, along with the patient, that amputation would be most appropriate. 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 Since his surgery, he has been wearing compression stockings bilaterally, but as he is unable to apply or remove them himself, he wears them for a week at a time. This appears to have resulted in a pressure injury on his right anterior ankle at the point of flexion; he has a similar looking abrasion that has not broken the skin in the same site on the left. 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 07/21/2022: The right anterior ankle wound is nearly closed aside from a small open area that has some slough accumulation. The amputation site has crusting and eschar present. Once this was removed, it was revealed that the amputation site  has healed. The abrasion on the left anterior ankle at the point of flexion is no longer present. 07/28/2022: The patient dropped a door on his leg and has a new wound to his right anterior tibial surface. There is some nonviable skin hanging from the cranial portion of the wound. The fat layer is exposed. The right anterior ankle wound is a little bit smaller and has some accumulated slough present. 08/04/2022: The wound on his right anterior tibial surface is a little bit smaller today. There is still slough accumulation. The right anterior ankle wound is quite a bit smaller today and also has slough buildup on the surface. 08/13/2022: The patient dropped another object that  struck him in the left anterior tibial surface. This has resulted in wound at this location. It is fairly superficial but does involve the fat layer. The right anterior tibial surface wound has a little bit of eschar on the surface and underneath, it is quite small. The right anterior ankle wound has also contracted but does have slough and eschar buildup as well. 08/21/2022: The right anterior tibial wound has healed. The left anterior tibial wound is quite a bit smaller and more superficial with just a little eschar on the surface. The right anterior ankle wound also continues to contract but continues to accumulate slough and eschar, as well. 08/27/2022: The left anterior tibial wound is closed. The right anterior ankle wound is considerably smaller with just a little eschar and slough buildup. Electronic Signature(s) Signed: 08/27/2022 11:50:41 AM By: Fredirick Maudlin MD FACS Signed: 08/27/2022 4:28:13 PM By: Linton Ham MD Entered By: Fredirick Maudlin on 08/27/2022 11:50:40 -------------------------------------------------------------------------------- Physical Exam Details Patient Name: [name]: [name] [name] 08/27/2022 11:00 A M Medical Record Number: [id number] Patient Account Number: [id number] Date of Birth/Sex: Treating RN: January 31, 1941 (81 y.o. [name] Primary Care Provider: [name] Other Clinician: Referring Provider: Treating Provider/Extender: [name] Weeks in Treatment: 6 Constitutional . . . . No acute distress.[name] Respiratory Normal work of breathing on room air.. Notes 08/27/2022: The left anterior tibial wound is closed. The right anterior ankle wound is considerably smaller with just a little eschar and slough buildup. Electronic Signature(s) Signed: 08/27/2022 11:51:10 AM By: Fredirick Maudlin MD FACS Signed: 08/27/2022 4:28:13 PM By: Linton Ham MD Entered By: Fredirick Maudlin on 08/27/2022  11:51:10 [name] ([id number]) [id number] Page 4 of 10 -------------------------------------------------------------------------------- Physician Orders Details Patient Name: [name]: [name], [name] [name] 08/27/2022 11:00 A M Medical Record Number: [id number] Patient Account Number: [id number] Date of Birth/Sex: Treating RN: Jan 09, 1941 (81 y.o. [name] Primary Care Provider: [name] Other Clinician: Referring Provider: Treating Provider/Extender: [name] Weeks in Treatment: 6 Verbal / Phone Orders: No Diagnosis Coding ICD-10 Coding Code Description [phone] Pressure ulcer of right ankle, stage 2 N18.30 Chronic kidney disease, stage 3 unspecified I87.2 Venous insufficiency (chronic) (peripheral) L97.812 Non-pressure chronic ulcer of other part of right lower leg with fat layer exposed L97.822 Non-pressure chronic ulcer of other part of left lower leg with fat layer exposed Follow-up Appointments ppointment in 1 week. - Dr. Celine Ahr- Rm 2 Wednesday 09/03/22 at 2:30pm Return A Anesthetic Wound #7 Right,Anterior Ankle (In clinic) Topical Lidocaine 4% applied to wound bed - in clinic, prior to debridement Bathing/ Shower/ Hygiene May shower and wash wound with soap and water. Edema Control - Lymphedema / SCD / Other Left Lower Extremity Elevate legs to the level of the heart  or above for 30 minutes daily and/or when sitting, a frequency of: - throughout the day Avoid standing for long periods of time. Exercise regularly Moisturize legs daily. - both legs nightly Non Wound Condition Other Non Wound Condition Orders/Instructions: - Left foot Optifoam (and patient's compression stocking) Wound Treatment Wound #7 - Ankle Wound Laterality: Right, Anterior Cleanser: Soap and Water 1 x Per Week Discharge Instructions: May shower and wash wound with dial antibacterial soap and water prior to dressing  change. Prim Dressing: KerraCel Ag Gelling Fiber Dressing, 2x2 in (silver alginate) 1 x Per Week ary Discharge Instructions: Apply silver alginate to wound bed as instructed Secondary Dressing: ALLEVYN Gentle Border, 4x4 (in/in) 1 x Per Week Discharge Instructions: Apply over primary dressing as directed. Compression Wrap: ThreePress (3 layer compression wrap) 1 x Per Week Discharge Instructions: Apply three layer compression as directed. Electronic Signature(s) Signed: 08/27/2022 4:28:13 PM By: Linton Ham MD Signed: 08/27/2022 5:18:16 PM By: [name] RN Entered By: [name] on 08/27/2022 12:11:39 [name] ([id number]) [id number] Page 5 of 10 -------------------------------------------------------------------------------- Problem List Details Patient Name: [name]: [name], [name] [name] 08/27/2022 11:00 A M Medical Record Number: [id number] Patient Account Number: [id number] Date of Birth/Sex: Treating RN: [birth year] (81 y.o. [name] Primary Care Provider: [name] Other Clinician: Referring Provider: Treating Provider/Extender: [name] Weeks in Treatment: 6 Active Problems ICD-10 Encounter Code Description Active Date MDM Diagnosis L89.512 Pressure ulcer of right ankle, stage 2 07/10/2022 No Yes N18.30 Chronic kidney disease, stage 3 unspecified 07/10/2022 No Yes I87.2 Venous insufficiency (chronic) (peripheral) 07/10/2022 No Yes L97.812 Non-pressure chronic ulcer of other part of right lower leg with fat layer 07/28/2022 No Yes exposed L97.822 Non-pressure chronic ulcer of other part of left lower leg with fat layer exposed10/02/2022 No Yes Inactive Problems ICD-10 Code Description Active Date Inactive Date L97.512 Non-pressure chronic ulcer of other part of right foot with fat layer exposed 07/10/2022 07/10/2022 T81.31XS Disruption of external operation (surgical) wound, not  elsewhere classified, sequela 07/10/2022 07/10/2022 Resolved Problems Electronic Signature(s) Signed: 08/27/2022 11:49:57 AM By: Fredirick Maudlin MD FACS Signed: 08/27/2022 4:28:13 PM By: Linton Ham MD Entered By: Fredirick Maudlin on 08/27/2022 11:49:57 -------------------------------------------------------------------------------- Progress Note Details Patient Name: [name]: [name] [name] 08/27/2022 11:00 A M Medical Record Number: [id number] Patient Account Number: [id number] Date of Birth/Sex: Treating RN: [birth year] (81 y.o. 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 He has been getting this dressed that [employer] and he had Xeroform on this with gauze. The patient is not exactly sure how this came about but he is not able to dress the wound himself because of limitations due to what sounds like spinal stenosis and pain. He  had a wound on the ankle as well but that healed over. He was seen by vascular in May 2021. This was related to a right great toe wound. He had an angiogram that was completely normal on both sides good perfusion right  into the feet. He also looks like he has lymphedema and he has compression stockings but he cannot get them on and off so he essentially leaves he is on even when he showering Past medical history includes prediabetes, low back pain secondary I think the lumbar spinal stenosis has been offered a decompressive laminectomy but he has not gone forth with this. 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 ABI was noncompressible in our clinic on the right right first toe. 4/7; patient I admitted the clinic last week. He has an area on the tip of his right first toe. We cleaned this off last week and have been using silver collagen. He is in the same crocs today that he wore last week and I told him not to use. For 1 reason or another he could not handle a surgical sandal. 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 He is not a diabetic 4/12; right first toe wound chronic. We have been using silver collagen. Once again he comes in then the same pair of old crocs that he comes in every week. 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 His x-ray suggested suspicious for osteomyelitis with bone irregularity at the tuft of the right great toe 4/21; patient has been using silver collagen every other day to the wound on his right great toe. He continues to wear crocs. He has no complaints or issues today. 5/5; 2-week follow-up. 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 Mild soft tissue edema with enhancement at the distal aspect of the great toe suggestive of cellulitis. Fortunately the wound actually looks better. Has been using Hydrofera Blue 5/12; started him on Augmentin last week 875 twice daily he is tolerating this well. Last creatinine I see is 1.77 in early April. We are using Hydrofera Blue to the wound which  actually looks better today. 5/17; tolerating the Augmentin well. He says the wound is actually been hurting him episodically. Actually the surface of this looks quite good. He did not get his lab work done. We are using [facility] he is changing this himself 6/2; still taking Augmentin he should be rounding into his last week. This was empirically for underlying osteomyelitis. The area on the tip of his toe is still open still with some depth but no palpable bone. We have been using Hydrofera Blue. Surface area of the wound has not been making much improvement Is work was essentially unremarkable except for a creatinine of 1.79. Reason for his renal insufficiency is not clear he is not a diabetic does not have hypertension however he does take NSAIDs and Lasix I wonder if this is the issue here. Note that his creatinine on 11/12/2018 was 1.45. Platelet count slightly low at 143 however his inflammatory markers were really in the normal range at 10 and 2 sedimentation rate and C-reactive protein respectively. 6/9; he apparently had another 2 weeks of Augmentin after we look that this last time. Therefore he should be coming into his last week next week. We  are using polymen on the tip of the toe. He cannot get down to do the dressing so he comes in for a nurse visit. He comes in in crocs I have warned him against this although he says he is using a different shoe for most of the day he works in [employer]. 6/16; he comes back in in crocs. I have spoken him about this before. His areas on the tip of his toe this actually looks quite good we have been using polymen. He has completed his antibiotics which we empirically gave him for underlying osteomyelitis. Everything looks better to me in spite of his noncompliance with footwear recommendation 7/11; patient has been followed through nurse visits. He was last seen by provider on 6/16. He reports improvement to the wound with the use of PolyMem.  He denies signs of infection. 7/21; the patient arrives with the area on the tip of his right great toe completely healed. He has the same proximal on that I see every time. I treated him empirically with Augmentin for osteomyelitis. He needs to be vigilant about this. READMISSION 03/19/2022 The patient returns with a reopening of the right great toe wound. Apparently this occurred in around October of last year. He was followed by podiatry for some time. They have performed x-rays but unfortunately, we do not have access to them or the results. No report as to whether or not he might have osteomyelitis. In addition, he has a new wound on the medial aspect of the right great toe as well as the anterior tibial surface of his right lower leg. He does state that he was wearing compression stockings when this tibial wound occurred. He continues to wear crocs. 04/02/2022: For some reason, the patient has not been seen in 2 weeks. He says that he had to reschedule his visit. Therefore his Coflex compression wrap was left in situ for 14 days. It appears that his skin became macerated and he has opened up several new superficial wounds on his right anterior tibial surface. We did get an x-ray to evaluate for osteomyelitis and none was seen. All of the pre-existing wounds are smaller today and the new wounds look like they are limited to just the breakdown of skin. 04/10/2022: The wounds on his right anterior tibial surface are actually a little bit larger today. He seems to have some skin irritation and maceration in the area again. The tip of his toe is nearly closed as is the wound on the dorsal surface of his right great toe. READMISSION 07/10/2022 Since our last visit, the patient went to the operating room to have a left hip replacement, but in the preop holding area, his surgeon noted a foul odor coming from his right foot. He contacted me and described the findings of his right great toe and we mutually  agreed, along with the patient, that amputation would be most appropriate. 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 Since his surgery, he has been wearing compression stockings bilaterally, but as he is unable to apply or remove them himself, he wears them for a week at a time. This appears to have resulted in a pressure injury on his right anterior ankle at the point of flexion; he has a similar looking abrasion that has not broken the skin in the same site on the left. 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 07/21/2022: The  right anterior ankle wound is nearly closed aside from a small open area that has some slough accumulation. The amputation site has crusting and eschar present. Once this was removed, it was revealed that the amputation site has healed. The abrasion on the left anterior ankle at the point of flexion is no longer present. 07/28/2022: The patient dropped a door on his leg and has a new wound to his right anterior tibial surface. There is some nonviable skin hanging from the cranial portion of the wound. The fat layer is exposed. The right anterior ankle wound is a little bit smaller and has some accumulated slough present. 08/04/2022: The wound on his right anterior tibial surface is a little bit smaller today. There is still slough accumulation. The right anterior ankle wound is quite a bit smaller today and also has slough buildup on the surface. 08/13/2022: The patient dropped another object that struck him in the left anterior tibial surface. This has resulted in wound at this location. It is fairly superficial but does involve the fat layer. The right anterior tibial surface wound has a little bit of eschar on the surface and underneath, it is quite small. The right anterior ankle wound has also contracted but does have slough and eschar buildup as well. [name], [name] ([id number])  [id number] Page 7 of 10 08/21/2022: The right anterior tibial wound has healed. The left anterior tibial wound is quite a bit smaller and more superficial with just a little eschar on the surface. The right anterior ankle wound also continues to contract but continues to accumulate slough and eschar, as well. 08/27/2022: The left anterior tibial wound is closed. The right anterior ankle wound is considerably smaller with just a little eschar and slough buildup. Patient History Information obtained from Patient. Family History Diabetes - Maternal Grandparents, Heart Disease - Father, Hypertension - Father, Stroke - Father, Thyroid Problems - Mother,Child, No family history of Cancer, Hereditary Spherocytosis, Kidney Disease, Lung Disease, Seizures, Tuberculosis. Social History Never smoker, Marital Status - Single, Alcohol Use - Rarely, Drug Use - No History, Caffeine Use - Daily - coffee. Medical History Eyes Patient has history of Cataracts - 2019 Ear/Nose/Mouth/Throat Patient has history of Chronic sinus problems/congestion Respiratory Patient has history of Asthma, Sleep Apnea Cardiovascular Patient has history of Hypertension, Peripheral Venous Disease Endocrine Denies history of Type II Diabetes Musculoskeletal Patient has history of Osteoarthritis Medical A Surgical History Notes nd Gastrointestinal GERD Genitourinary BPH Musculoskeletal Degenerative disc disease Neurologic Neuromuscular disorder Objective Constitutional No acute distress.. Vitals Time Taken: 11:23 AM, Height: 68 in, Weight: 245 lbs, BMI: 37.2, Temperature: 97.8 F, Pulse: 95 bpm, Respiratory Rate: 20 breaths/min, Blood Pressure: 129/76 mmHg. Respiratory Normal work of breathing on room air.. General Notes: 08/27/2022: The left anterior tibial wound is closed. The right anterior ankle wound is considerably smaller with just a little eschar and  slough buildup. Integumentary (Hair, Skin) Wound #10 status is Healed - Epithelialized. Original cause of wound was Skin T ear/Laceration. The date acquired was: 08/09/2022. The wound has been in treatment 2 weeks. The wound is located on the Left,Anterior Lower Leg. The wound measures 0cm length x 0cm width x 0cm depth; 0cm^2 area and 0cm^3 volume. There is no tunneling or undermining noted. There is a none present amount of drainage noted. The wound margin is flat and intact. There is no granulation within the wound bed. There is no necrotic tissue within the wound bed. The periwound skin appearance had no abnormalities noted for texture.  The periwound skin appearance had no abnormalities noted for moisture. The periwound skin appearance had no abnormalities noted for color. Periwound temperature was noted as No Abnormality. Wound #7 status is Open. Original cause of wound was Footwear Injury. The date acquired was: 07/03/2022. The wound has been in treatment 6 weeks. The wound is located on the Right,Anterior Ankle. The wound measures 0.1cm length x 0.1cm width x 0.1cm depth; 0.008cm^2 area and 0.001cm^3 volume. There is Fat Layer (Subcutaneous Tissue) exposed. There is no tunneling or undermining noted. There is a medium amount of serosanguineous drainage noted. The wound margin is flat and intact. There is small (1-33%) red, pink granulation within the wound bed. There is a large (67-100%) amount of necrotic tissue within the wound bed including Eschar and Adherent Slough. The periwound skin appearance exhibited: Hemosiderin Staining. The periwound skin appearance did not exhibit: Callus, Crepitus, Excoriation, Induration, Rash, Scarring, Dry/Scaly, Maceration, Atrophie Blanche, Cyanosis, Ecchymosis, Mottled, Pallor, Rubor, Erythema. Periwound temperature was noted as No Abnormality. 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 The right anterior ankle wound is considerably smaller with just a little eschar and slough buildup. I used a curette to debride the slough and eschar from the right anterior ankle wound. We will continue silver alginate and compression wraps on the right. He did bring a pair of compression stockings with him and we will help him apply that to the left. Follow-up in 1 week. Electronic Signature(s) Signed: 08/27/2022 12:04:15 PM By: Fredirick Maudlin MD FACS Signed: 08/27/2022 4:28:13 PM By: Linton Ham MD Entered By: Fredirick Maudlin on 08/27/2022 12:04:15 -------------------------------------------------------------------------------- HxROS Details Patient Name: [name]: [name] [name] 08/27/2022 11:00 A M Medical Record Number: [id number] Patient Account Number: [id number] Date of Birth/Sex: Treating RN: 1941/06/14 (81 y.o. [name] Primary Care Provider: [name] Other Clinician: Referring Provider: Treating Provider/Extender: [name] Weeks in Treatment: 6 Information Obtained From Patient Eyes Medical History: Positive for: Cataracts - 2019 Ear/Nose/Mouth/Throat Medical History: Positive for: Chronic sinus problems/congestion Respiratory Medical History: Positive for: Asthma; Sleep Apnea Cardiovascular Medical History: Positive for: Hypertension; Peripheral Venous Disease Gastrointestinal Medical History: Past Medical History Notes: GERD Endocrine Medical History: Negative for: Type II Diabetes DARTANIAN, KNAGGS ([id number])  [id number] Page 9 of 10 Genitourinary Medical History: Past Medical History Notes: BPH Musculoskeletal Medical History: Positive for: Osteoarthritis Past Medical History Notes: Degenerative disc disease Neurologic Medical History: Past Medical History Notes: Neuromuscular disorder HBO Extended History Items Ear/Nose/Mouth/Throat: Eyes: Chronic sinus Cataracts problems/congestion Immunizations Pneumococcal Vaccine: Received Pneumococcal Vaccination: Yes Received Pneumococcal Vaccination On or After 60th Birthday: No Implantable Devices None Family and Social History Cancer: No; Diabetes: Yes - Maternal Grandparents; Heart Disease: Yes - Father; Hereditary Spherocytosis: No; Hypertension: Yes - Father; Kidney Disease: No; Lung Disease: No; Seizures: No; Stroke: Yes - Father; Thyroid Problems: Yes - Mother,Child; Tuberculosis: No; Never smoker; Marital Status - Single; Alcohol Use: Rarely; Drug Use: No History; Caffeine Use: Daily - coffee; Financial Concerns: No; Food, Clothing or Shelter Needs: No; Support System Lacking: No; Transportation Concerns: No Electronic Signature(s) Signed: 08/27/2022 12:35:35 PM By: Fredirick Maudlin MD FACS Signed: 08/27/2022  4:28:13 PM By: Linton Ham MD Signed: 08/27/2022 5:18:16 PM By: [name] RN Entered By: Fredirick Maudlin on 08/27/2022 11:50:49 -------------------------------------------------------------------------------- SuperBill Details Patient Name: [name]: [name] [name] 08/27/2022 Medical Record Number: [id number] Patient Account Number: [id number] Date of Birth/Sex: Treating RN: 07-12-1941 (81 y.o. [name] Primary Care Provider: [name] Other Clinician: Referring Provider: Treating Provider/Extender: [name] Weeks in Treatment: 6 Diagnosis Coding ICD-10 Codes Code Description [phone] Pressure ulcer of right ankle, stage 2 N18.30  Chronic kidney disease, stage 3 unspecified I87.2 Venous insufficiency (chronic) (peripheral) L97.812 Non-pressure chronic ulcer of other part of right lower leg with fat layer exposed L97.822 Non-pressure chronic ulcer of other part of left lower leg with fat layer exposed Facility Procedures : WENDALL, ISABELL CodeDebbora Dus ([id number]) [id number] ICD L8 Description: [id number] - DEBRIDE WOUND 1ST 20 SQ CM OR < -10 Diagnosis Description 9.512 Pressure ulcer of right ankle, stage 2 Modifier: [id number] 1 Quantity: Page 10 of 10 Physician Procedures : CPT4 Code Description Modifier [id number] - WC PHYS LEVEL 3 - EST PT 25 ICD-10 Diagnosis Description L89.512 Pressure ulcer of right ankle, stage 2 I87.2 Venous insufficiency (chronic) (peripheral) N18.30 Chronic kidney disease, stage 3 unspecified Quantity: 1 : [id number] - WC PHYS DEBR WO ANESTH 20 SQ CM ICD-10 Diagnosis Description L89.512 Pressure ulcer of right ankle, stage 2 Quantity: 1 Electronic Signature(s) Signed: 08/27/2022 12:06:12 PM By: Fredirick Maudlin MD FACS Signed: 08/27/2022 4:28:13 PM By: Linton Ham MD Entered By: Fredirick Maudlin on 08/27/2022 12:06:12

## 2022-09-01 ENCOUNTER — Ambulatory Visit: Payer: Self-pay

## 2022-09-01 ENCOUNTER — Ambulatory Visit (INDEPENDENT_AMBULATORY_CARE_PROVIDER_SITE_OTHER): Payer: Medicare Other | Admitting: Physical Medicine and Rehabilitation

## 2022-09-01 VITALS — BP 150/72 | HR 76

## 2022-09-01 DIAGNOSIS — M5416 Radiculopathy, lumbar region: Secondary | ICD-10-CM | POA: Diagnosis not present

## 2022-09-01 DIAGNOSIS — M21371 Foot drop, right foot: Secondary | ICD-10-CM

## 2022-09-01 DIAGNOSIS — M4316 Spondylolisthesis, lumbar region: Secondary | ICD-10-CM

## 2022-09-01 DIAGNOSIS — M48062 Spinal stenosis, lumbar region with neurogenic claudication: Secondary | ICD-10-CM

## 2022-09-01 MED ORDER — METHYLPREDNISOLONE ACETATE 80 MG/ML IJ SUSP
80.0000 mg | Freq: Once | INTRAMUSCULAR | Status: AC
  Administered 2022-09-01: 80 mg

## 2022-09-01 NOTE — Patient Instructions (Signed)

## 2022-09-01 NOTE — Progress Notes (Signed)
Numeric Pain Rating Scale and Functional Assessment Average Pain 8   In the last MONTH (on 0-10 scale) has pain interfered with the following?  1. General activity like being  able to carry out your everyday physical activities such as walking, climbing stairs, carrying groceries, or moving a chair?  Rating(10)   +Driver, -BT, -Dye Allergies.  Moving things made pain worse. Takes Hydrocodone and Methocarbamol for pain

## 2022-09-02 ENCOUNTER — Encounter (HOSPITAL_BASED_OUTPATIENT_CLINIC_OR_DEPARTMENT_OTHER): Payer: Medicare Other | Admitting: General Surgery

## 2022-09-02 DIAGNOSIS — G5602 Carpal tunnel syndrome, left upper limb: Secondary | ICD-10-CM | POA: Diagnosis not present

## 2022-09-02 DIAGNOSIS — M25551 Pain in right hip: Secondary | ICD-10-CM | POA: Diagnosis not present

## 2022-09-02 DIAGNOSIS — G5603 Carpal tunnel syndrome, bilateral upper limbs: Secondary | ICD-10-CM | POA: Diagnosis not present

## 2022-09-02 DIAGNOSIS — M1711 Unilateral primary osteoarthritis, right knee: Secondary | ICD-10-CM | POA: Diagnosis not present

## 2022-09-02 DIAGNOSIS — Z96642 Presence of left artificial hip joint: Secondary | ICD-10-CM | POA: Diagnosis not present

## 2022-09-03 ENCOUNTER — Encounter (HOSPITAL_BASED_OUTPATIENT_CLINIC_OR_DEPARTMENT_OTHER): Payer: Medicare Other | Admitting: General Surgery

## 2022-09-03 DIAGNOSIS — L97822 Non-pressure chronic ulcer of other part of left lower leg with fat layer exposed: Secondary | ICD-10-CM | POA: Diagnosis not present

## 2022-09-03 DIAGNOSIS — L97812 Non-pressure chronic ulcer of other part of right lower leg with fat layer exposed: Secondary | ICD-10-CM | POA: Diagnosis not present

## 2022-09-03 DIAGNOSIS — M86179 Other acute osteomyelitis, unspecified ankle and foot: Secondary | ICD-10-CM | POA: Diagnosis not present

## 2022-09-03 DIAGNOSIS — M1990 Unspecified osteoarthritis, unspecified site: Secondary | ICD-10-CM | POA: Diagnosis not present

## 2022-09-03 DIAGNOSIS — L89512 Pressure ulcer of right ankle, stage 2: Secondary | ICD-10-CM | POA: Diagnosis not present

## 2022-09-03 DIAGNOSIS — I890 Lymphedema, not elsewhere classified: Secondary | ICD-10-CM | POA: Diagnosis not present

## 2022-09-03 NOTE — Progress Notes (Signed)
SAIR, FAULCON ([id number]) [id number] Page 1 of 8 Visit Report for 09/03/2022 Arrival Information Details Patient Name: [name]: [name], [name] [name] 09/03/2022 3:00 PM Medical Record Number: [id number] Patient Account Number: [id number] Date of Birth/Sex: Treating RN: 1941/05/22 (81 y.o. [name] Primary Care [name]: [name] Other Clinician: Referring [name]: Treating [name]/Extender: [name] Weeks in Treatment: 7 Visit Information History Since Last Visit Added or deleted any medications: No Patient Arrived: [name] Any new allergies or adverse reactions: No Arrival Time: 15:21 Had a fall or experienced change in No Accompanied By: self activities of daily living that may affect Transfer Assistance: None risk of falls: Patient Identification Verified: Yes Signs or symptoms of abuse/neglect since last visito No Secondary Verification Process Completed: Yes Hospitalized since last visit: No Patient Requires Transmission-Based Precautions: No Implantable device outside of the clinic excluding No Patient Has Alerts: Yes cellular tissue based products placed in the center Patient Alerts: ABI LLE 1.16, RLE 1.14 since last visit: Has Dressing in Place as Prescribed: Yes Has Compression in Place as Prescribed: Yes Pain Present Now: Yes Electronic Signature(s) Signed: 09/03/2022 4:57:46 PM By: [name] Entered By: [name] on 09/03/2022 15:24:04 -------------------------------------------------------------------------------- Clinic Level of Care Assessment Details Patient Name: [name]: [name], [name] 09/03/2022 3:00 PM Medical Record Number: [id number] Patient Account Number: [id number] Date of Birth/Sex: Treating RN: 08/21/1941 (81 y.o. [name] Primary Care [name]: [name] Other Clinician: Referring [name]: Treating [name]/Extender: [name] Weeks in Treatment: 7 Clinic Level of Care Assessment Items TOOL 4 Quantity Score X- 1 0 Use when only an EandM is performed on FOLLOW-UP visit ASSESSMENTS - Nursing Assessment / Reassessment X- 1 10 Reassessment of Co-morbidities (includes updates in patient status) X- 1 5 Reassessment of Adherence to Treatment Plan ASSESSMENTS - Wound and Skin A ssessment / Reassessment X - Simple Wound Assessment / Reassessment - one wound 1 5 '[]'$  - 0 Complex Wound Assessment / Reassessment - multiple wounds '[]'$  - 0 Dermatologic / Skin Assessment (not related to wound area) ASSESSMENTS - Focused Assessment '[]'$  - 0 Circumferential Edema Measurements - multi extremities '[]'$  - 0 Nutritional Assessment / Counseling / Intervention [name] ([id number]) [id number] Page 2 of 8 '[]'$  - 0 Lower Extremity Assessment (monofilament, tuning fork, pulses) '[]'$  - 0 Peripheral Arterial Disease Assessment (using hand held doppler) ASSESSMENTS - Ostomy and/or Continence Assessment and Care '[]'$  - 0 Incontinence Assessment and Management '[]'$  - 0 Ostomy Care Assessment and Management (repouching, etc.) PROCESS - Coordination of Care X - Simple Patient / Family Education for ongoing care 1 15 '[]'$  - 0 Complex (extensive) Patient / Family Education for ongoing care '[]'$  - 0 Staff obtains Programmer, systems, Records, T Results / Process Orders est '[]'$  - 0 Staff telephones HHA, Nursing Homes / Clarify orders / etc '[]'$  - 0 Routine Transfer to another Facility (non-emergent condition) '[]'$  - 0 Routine Hospital Admission (non-emergent condition) '[]'$  - 0 New Admissions / Biomedical engineer / Ordering NPWT Apligraf, etc. , '[]'$  - 0 Emergency Hospital Admission (emergent condition) X- 1 10 Simple Discharge Coordination '[]'$  - 0 Complex (extensive) Discharge Coordination PROCESS - Special Needs '[]'$  - 0 Pediatric / Minor Patient Management '[]'$  - 0 Isolation Patient  Management '[]'$  - 0 Hearing / Language / Visual special needs '[]'$  - 0 Assessment of Community assistance (transportation, D/C planning, etc.) '[]'$  - 0 Additional assistance / Altered mentation '[]'$  - 0 Support Surface(s)  Assessment (bed, cushion, seat, etc.) INTERVENTIONS - Wound Cleansing / Measurement X - Simple Wound Cleansing - one wound 1 5 '[]'$  - 0 Complex Wound Cleansing - multiple wounds X- 1 5 Wound Imaging (photographs - any number of wounds) '[]'$  - 0 Wound Tracing (instead of photographs) X- 1 5 Simple Wound Measurement - one wound '[]'$  - 0 Complex Wound Measurement - multiple wounds INTERVENTIONS - Wound Dressings X - Small Wound Dressing one or multiple wounds 1 10 '[]'$  - 0 Medium Wound Dressing one or multiple wounds '[]'$  - 0 Large Wound Dressing one or multiple wounds '[]'$  - 0 Application of Medications - topical '[]'$  - 0 Application of Medications - injection INTERVENTIONS - Miscellaneous '[]'$  - 0 External ear exam '[]'$  - 0 Specimen Collection (cultures, biopsies, blood, body fluids, etc.) '[]'$  - 0 Specimen(s) / Culture(s) sent or taken to Lab for analysis '[]'$  - 0 Patient Transfer (multiple staff / Civil Service fast streamer / Similar devices) '[]'$  - 0 Simple Staple / Suture removal (25 or less) '[]'$  - 0 Complex Staple / Suture removal (26 or more) '[]'$  - 0 Hypo / Hyperglycemic Management (close monitor of Blood Glucose) [name], [name] ([id number]) [id number] Page 3 of 8 '[]'$  - 0 Ankle / Brachial Index (ABI) - do not check if billed separately X- 1 5 Vital Signs Has the patient been seen at the hospital within the last three years: Yes Total Score: 75 Level Of Care: New/Established - Level 2 Electronic Signature(s) Signed: 09/03/2022 4:57:50 PM By: Blanche East RN Entered By: Blanche East on 09/03/2022 15:54:37 -------------------------------------------------------------------------------- Encounter Discharge Information Details Patient Name: Date of  Service: [name] Banker [name] 09/03/2022 3:00 PM Medical Record Number: [id number] Patient Account Number: [id number] Date of Birth/Sex: Treating RN: Mar 24, 1941 (81 y.o. [name] Primary Care Saphire Barnhart: [name] Other Clinician: Referring Baylen Buckner: Treating Layana Konkel/Extender: [name] Weeks in Treatment: 7 Encounter Discharge Information Items Discharge Condition: Stable Ambulatory Status: Cane Discharge Destination: Home Transportation: Private Auto Accompanied By: self Schedule Follow-up Appointment: Yes Clinical Summary of Care: Electronic Signature(s) Signed: 09/03/2022 4:57:50 PM By: Blanche East RN Entered By: Blanche East on 09/03/2022 15:55:26 -------------------------------------------------------------------------------- Lower Extremity Assessment Details Patient Name: [name]: [name] Banker [name] 09/03/2022 3:00 PM Medical Record Number: [id number] Patient Account Number: [id number] Date of Birth/Sex: Treating RN: 08/15/1941 (81 y.o. [name] Primary Care Arda Daggs: [name] Other Clinician: Referring Krayton Wortley: Treating Lataunya Ruud/Extender: [name] Weeks in Treatment: 7 Edema Assessment Assessed: [Left: No] [Right: No] [Left: Edema] [Right: :] Calf Left: Right: Point of Measurement: From Medial Instep 36.5 cm 34.1 cm Ankle Left: Right: Point of Measurement: From Medial Instep 24.5 cm 22.5 cm Vascular Assessment Left: [[id number] Page 4 of 8Right:] Pulses: Dorsalis Pedis Palpable: [[id number] Page 4 of 8Yes] Electronic Signature(s) Signed: 09/03/2022 4:57:46 PM By: Sabas Sous By: [name] on 09/03/2022 15:30:40 -------------------------------------------------------------------------------- Multi Wound Chart Details Patient Name: [name]: [name] Banker [name] 09/03/2022 3:00  PM Medical Record Number: [id number] Patient Account Number: [id number] Date of Birth/Sex: Treating RN: 25-Feb-1941 (81 y.o. M) Primary Care Hriday Stai: [name] Other Clinician: Referring Sonia Stickels: Treating Jahred Tatar/Extender: [name] Weeks in Treatment: 7 Vital Signs Height(in): 68 Pulse(bpm): 95 Weight(lbs): 245 Blood Pressure(mmHg): 154/86 Body Mass Index(BMI): 37.2 Temperature(F): 97.9 Respiratory Rate(breaths/min): 18 [7:Photos:] [N/A:N/A] Right, Anterior Ankle N/A N/A Wound Location: Footwear Injury N/A N/A Wounding Event: Venous Leg Ulcer N/A N/A Primary Etiology: Cataracts, Chronic sinus N/A N/A  Comorbid History: problems/congestion, Asthma, Sleep Apnea, Hypertension, Peripheral Venous Disease, Osteoarthritis 07/03/2022 N/A N/A Date Acquired: 7 N/A N/A Weeks of Treatment: Open N/A N/A Wound Status: No N/A N/A Wound Recurrence: 0.3x0.4x0.1 N/A N/A Measurements L x W x D (cm) 0.094 N/A N/A A (cm) : rea 0.009 N/A N/A Volume (cm) : 93.40% N/A N/A % Reduction in Area: 93.60% N/A N/A % Reduction in Volume: Full Thickness Without Exposed N/A N/A Classification: Support Structures Small N/A N/A Exudate A mount: Serous N/A N/A Exudate Type: amber N/A N/A Exudate Color: Flat and Intact N/A N/A Wound Margin: Large (67-100%) N/A N/A Granulation Amount: Red, Pink N/A N/A Granulation Quality: Small (1-33%) N/A N/A Necrotic Amount: Eschar N/A N/A Necrotic Tissue: Fat Layer (Subcutaneous Tissue): Yes N/A N/A Exposed Structures: Fascia: No Tendon: No Muscle: No Joint: No Bone: No Large (67-100%) N/A N/A Epithelialization: Excoriation: No N/A N/A Periwound Skin TextureCADON, [name] ([id number]) [id number] Page 5 of 8 Induration: No Callus: No Crepitus: No Rash: No Scarring: No Dry/Scaly: Yes N/A N/A Periwound Skin Moisture: Maceration: No Hemosiderin Staining: Yes N/A  N/A Periwound Skin Color: Atrophie Blanche: No Cyanosis: No Ecchymosis: No Erythema: No Mottled: No Pallor: No Rubor: No No Abnormality N/A N/A Temperature: Treatment Notes Electronic Signature(s) Signed: 09/03/2022 3:40:23 PM By: Fredirick Maudlin MD FACS Entered By: Fredirick Maudlin on 09/03/2022 15:40:23 -------------------------------------------------------------------------------- Multi-Disciplinary Care Plan Details Patient Name: [name]: [name] Banker [name] 09/03/2022 3:00 PM Medical Record Number: [id number] Patient Account Number: [id number] Date of Birth/Sex: Treating RN: Jun 18, 1941 (81 y.o. [name] Primary Care Jadalynn Burr: [name] Other Clinician: Referring Marion Rosenberry: Treating Jenasis Straley/Extender: [name] Weeks in Treatment: 7 Active Inactive Electronic Signature(s) Signed: 09/03/2022 4:57:50 PM By: Blanche East RN Entered By: Blanche East on 09/03/2022 15:53:24 -------------------------------------------------------------------------------- Pain Assessment Details Patient Name: [name]: [name] Banker [name] 09/03/2022 3:00 PM Medical Record Number: [id number] Patient Account Number: [id number] Date of Birth/Sex: Treating RN: [birth year] (81 y.o. [name] Primary Care Nelsie Domino: [name] Other Clinician: Referring Rendon Howell: Treating Terryl Niziolek/Extender: [name] Weeks in Treatment: 7 Active Problems Location of Pain Severity and Description of Pain Patient Has Paino Yes Site Locations Pain Location: [name], [name] ([id number]) [id number] Page 6 of 8 Pain Location: Generalized Pain Duration of the Pain. Constant / Intermittento Intermittent Rate the pain. Current Pain Level: 3 Pain Management and Medication Current Pain Management: Electronic Signature(s) Signed: 09/03/2022 4:57:46 PM By: [name] Entered By: [name] on 09/03/2022 15:25:10 -------------------------------------------------------------------------------- Patient/Caregiver Education Details Patient Name: [name]: [name] Banker RD [name] 10/25/2023andnbsp3:00 PM Medical Record Number: [id number] Patient Account Number: [id number] Date of Birth/Gender: Treating RN: [birth year] (81 y.o. [name] Primary Care Physician: [name] Other Clinician: Referring Physician: Treating Physician/Extender: Maida Sale in Treatment: 7 Education Assessment Education Provided To: Patient Education Topics Provided Electronic Signature(s) Signed: 09/03/2022 4:57:50 PM By: Blanche East RN Entered By: Blanche East on 09/03/2022 15:53:53 -------------------------------------------------------------------------------- Wound Assessment Details Patient Name: [name]: [name] Banker [name] 09/03/2022 3:00 PM Medical Record Number: [id number] Patient Account Number: [id number] Date of Birth/Sex: Treating RN: 1941-07-21 (81 y.o. [name] Primary Care Jisela Merlino: [name] Other Clinician: Referring Hermelinda Diegel: Treating Felice Deem/Extender: [name] Weeks in Treatment: 7 Wound Status Wound Number: 7 Primary Venous Leg Ulcer [name], [name] ([id number]) [id number] Page 7 of 8 Etiology: Wound Location: Right, Anterior Ankle Wound Open Wounding Event: Footwear  Injury Status: Date Acquired: 07/03/2022 Comorbid Cataracts, Chronic sinus problems/congestion, Asthma, Sleep Weeks Of Treatment: 7 History: Apnea, Hypertension, Peripheral Venous Disease, Osteoarthritis Clustered Wound: No Photos Wound Measurements Length: (cm) Width: (cm) Depth: (cm) Area: (cm) Volume: (cm) 0 % Reduction in Area: 100% 0 % Reduction in Volume: 100% 0 Epithelialization: None 0 Tunneling: No 0 Undermining: No Wound Description Classification: Full  Thickness Without Exposed Suppor Wound Margin: Flat and Intact Exudate Amount: None Present t Structures Foul Odor After Cleansing: No Slough/Fibrino No Wound Bed Granulation Amount: Large (67-100%) Exposed Structure Granulation Quality: Pink Fascia Exposed: No Necrotic Amount: Small (1-33%) Fat Layer (Subcutaneous Tissue) Exposed: No Necrotic Quality: Eschar Tendon Exposed: No Muscle Exposed: No Joint Exposed: No Bone Exposed: No Periwound Skin Texture Texture Color No Abnormalities Noted: Yes No Abnormalities Noted: Yes Moisture Temperature / Pain No Abnormalities Noted: Yes Temperature: No Abnormality Electronic Signature(s) Signed: 09/03/2022 4:57:50 PM By: Blanche East RN Entered By: Blanche East on 09/03/2022 15:49:52 -------------------------------------------------------------------------------- Vitals Details Patient Name: [name]: [name] Banker [name] 09/03/2022 3:00 PM Medical Record Number: [id number] Patient Account Number: [id number] Date of Birth/Sex: Treating RN: 1941-06-21 (81 y.o. [name] Primary Care Caylin Nass: [name] Other Clinician: Referring [name]: Treating Donzell Coller/Extender: [name] Weeks in Treatment: 7 Vital Signs Time Taken: 15:25 Temperature (F): 97.9 Height (in): 68 Pulse (bpm): 95 Weight (lbs): 245 Respiratory Rate (breaths/min): [address] ([id number]) [id number] Page 8 of 8 Body Mass Index (BMI): 37.2 Blood Pressure (mmHg): 154/86 Reference Range: 80 - 120 mg / dl Electronic Signature(s) Signed: 09/03/2022 4:57:46 PM By: [name] Entered By: [name] on 09/03/2022 15:25:29

## 2022-09-03 NOTE — Progress Notes (Addendum)
SOLLIE, VULTAGGIO ([id number]) [id number] Page 1 of 8 Visit Report for 09/03/2022 Chief Complaint Document Details Patient Name: [name]: [name], [name] [name] 09/03/2022 3:00 PM Medical Record Number: [id number] Patient Account Number: [id number] Date of Birth/Sex: Treating RN: 05-Nov-1941 (81 y.o. M) Primary Care Provider: [name] Other Clinician: Referring Provider: Treating Provider/Extender: Benedict Needy Weeks in Treatment: 7 Information Obtained from: Patient Chief Complaint 02/07/2021; patient is here for review of a wound on the tip of his right great toe 03/19/2022: re-opening of the right great toe wound, with additional wounds on medial aspect of right great toe and right anterior tibial surface 07/10/2022: Partial dehiscence of right great toe amputation site, pressure ulcer of anterior right ankle Electronic Signature(s) Signed: 09/03/2022 3:40:29 PM By: Fredirick Maudlin MD FACS Entered By: Fredirick Maudlin on 09/03/2022 15:40:29 -------------------------------------------------------------------------------- HPI Details Patient Name: [name]: [name] [name] 09/03/2022 3:00 PM Medical Record Number: [id number] Patient Account Number: [id number] Date of Birth/Sex: Treating RN: 1941/10/18 (81 y.o. M) Primary Care Provider: [name] Other Clinician: Referring Provider: Treating Provider/Extender: Benedict Needy Weeks in Treatment: 7 History of Present Illness HPI Description: ADMISSION 02/07/2021 This is a 81 year old man who is not a diabetic "prediabetic" who comes in today letter asking [place] to look at an area on his right first toe tip that has been there about a year. He has been getting this dressed that [employer] and he had Xeroform on this with gauze. The patient is not exactly sure how this came about but he is not able to dress the wound himself because of limitations due to  what sounds like spinal stenosis and pain. He had a wound on the ankle as well but that healed over. He was seen by vascular in May 2021. This was related to a right great toe wound. He had an angiogram that was completely normal on both sides good perfusion right into the feet. He also looks like he has lymphedema and he has compression stockings but he cannot get them on and off so he essentially leaves he is on even when he showering Past medical history includes prediabetes, low back pain secondary I think the lumbar spinal stenosis has been offered a decompressive laminectomy but he has not gone forth with this. 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 ABI was noncompressible in our clinic on the right right first toe. 4/7; patient I admitted the clinic last week. He has an area on the tip of his right first toe. We cleaned this off last week and have been using silver collagen. He is in the same crocs today that he wore last week and I told him not to use. For 1 reason or another he could not handle a surgical sandal. 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 He is not a diabetic 4/12; right first toe wound chronic. We have been using silver collagen. Once again he comes in then the same pair of old crocs that he comes in every week. 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 His x-ray suggested suspicious for osteomyelitis with bone irregularity at the tuft of the right great toe 4/21; patient has been using silver collagen every other day to the wound on his right great toe. He continues to wear crocs. He  has no complaints or issues today. 5/5; 2-week follow-up. 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 Mild soft tissue edema with enhancement at the distal aspect of the great toe suggestive of  cellulitis. Fortunately the wound actually looks better. Has been using Hydrofera Blue 5/12; started him on Augmentin last week 875 twice daily he is tolerating this well. Last creatinine I see is 1.77 in early April. We are using [facility] to JOSEF, TOURIGNY ([id number]) [id number] Page 2 of 8 the wound which actually looks better today. 5/17; tolerating the Augmentin well. He says the wound is actually been hurting him episodically. Actually the surface of this looks quite good. He did not get his lab work done. We are using [facility] he is changing this himself 6/2; still taking Augmentin he should be rounding into his last week. This was empirically for underlying osteomyelitis. The area on the tip of his toe is still open still with some depth but no palpable bone. We have been using Hydrofera Blue. Surface area of the wound has not been making much improvement Is work was essentially unremarkable except for a creatinine of 1.79. Reason for his renal insufficiency is not clear he is not a diabetic does not have hypertension however he does take NSAIDs and Lasix I wonder if this is the issue here. Note that his creatinine on 11/12/2018 was 1.45. Platelet count slightly low at 143 however his inflammatory markers were really in the normal range at 10 and 2 sedimentation rate and C-reactive protein respectively. 6/9; he apparently had another 2 weeks of Augmentin after we look that this last time. Therefore he should be coming into his last week next week. We are using polymen on the tip of the toe. He cannot get down to do the dressing so he comes in for a nurse visit. He comes in in crocs I have warned him against this although he says he is using a different shoe for most of the day he works in [employer]. 6/16; he comes back in in crocs. I have spoken him about this before. His areas on the tip of his toe this actually looks quite good we have been  using polymen. He has completed his antibiotics which we empirically gave him for underlying osteomyelitis. Everything looks better to me in spite of his noncompliance with footwear recommendation 7/11; patient has been followed through nurse visits. He was last seen by provider on 6/16. He reports improvement to the wound with the use of PolyMem. He denies signs of infection. 7/21; the patient arrives with the area on the tip of his right great toe completely healed. He has the same proximal on that I see every time. I treated him empirically with Augmentin for osteomyelitis. He needs to be vigilant about this. READMISSION 03/19/2022 The patient returns with a reopening of the right great toe wound. Apparently this occurred in around October of last year. He was followed by podiatry for some time. They have performed x-rays but unfortunately, we do not have access to them or the results. No report as to whether or not he might have osteomyelitis. In addition, he has a new wound on the medial aspect of the right great toe as well as the anterior tibial surface of his right lower leg. He does state that he was wearing compression stockings when this tibial wound occurred. He continues to wear crocs. 04/02/2022: For some reason, the  patient has not been seen in 2 weeks. He says that he had to reschedule his visit. Therefore his Coflex compression wrap was left in situ for 14 days. It appears that his skin became macerated and he has opened up several new superficial wounds on his right anterior tibial surface. We did get an x-ray to evaluate for osteomyelitis and none was seen. All of the pre-existing wounds are smaller today and the new wounds look like they are limited to just the breakdown of skin. 04/10/2022: The wounds on his right anterior tibial surface are actually a little bit larger today. He seems to have some skin irritation and maceration in the area again. The tip of his toe is nearly  closed as is the wound on the dorsal surface of his right great toe. READMISSION 07/10/2022 Since our last visit, the patient went to the operating room to have a left hip replacement, but in the preop holding area, his surgeon noted a foul odor coming from his right foot. He contacted me and described the findings of his right great toe and we mutually agreed, along with the patient, that amputation would be most appropriate. 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 Since his surgery, he has been wearing compression stockings bilaterally, but as he is unable to apply or remove them himself, he wears them for a week at a time. This appears to have resulted in a pressure injury on his right anterior ankle at the point of flexion; he has a similar looking abrasion that has not broken the skin in the same site on the left. 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 07/21/2022: The right anterior ankle wound is nearly closed aside from a small open area that has some slough accumulation. The amputation site has crusting and eschar present. Once this was removed, it was revealed that the amputation site has healed. The abrasion on the left anterior ankle at the point of flexion is no longer present. 07/28/2022: The patient dropped a door on his leg and has a new wound to his right anterior tibial surface. There is some nonviable skin hanging from the cranial portion of the wound. The fat layer is exposed. The right anterior ankle wound is a little bit smaller and has some accumulated slough present. 08/04/2022: The wound on his right anterior tibial surface is a little bit smaller today. There is still slough accumulation. The right anterior ankle wound is quite a bit smaller today and also has slough buildup on the surface. 08/13/2022: The patient dropped another object that struck him in the left anterior tibial  surface. This has resulted in wound at this location. It is fairly superficial but does involve the fat layer. The right anterior tibial surface wound has a little bit of eschar on the surface and underneath, it is quite small. The right anterior ankle wound has also contracted but does have slough and eschar buildup as well. 08/21/2022: The right anterior tibial wound has healed. The left anterior tibial wound is quite a bit smaller and more superficial with just a little eschar on the surface. The right anterior ankle wound also continues to contract but continues to accumulate slough and eschar, as well. 08/27/2022: The left anterior tibial wound is closed. The right anterior ankle wound is considerably smaller with just a little eschar and slough buildup. 09/03/2022: His wounds are all healed. Electronic Signature(s)  Signed: 09/03/2022 3:40:43 PM By: Fredirick Maudlin MD FACS Entered By: Fredirick Maudlin on 09/03/2022 15:40:43 -------------------------------------------------------------------------------- Physical Exam Details Patient Name: [name]: [name] [name] 09/03/2022 3:00 PM Medical Record Number: [id number] Patient Account Number: [id number] Date of Birth/Sex: Treating RN: 11/17/1940 (81 y.o. M) Primary Care Provider: [name] Other Clinician: Referring Provider: Treating Provider/Extender: Benedict Needy Weeks in Treatment: [address] [place] ([id number]) [id number] Page 3 of 8 Constitutional Hypertensive, asymptomatic. . . . No acute distress.[name] Respiratory Normal work of breathing on room air.. Notes 09/03/2022: All wounds are healed. Electronic Signature(s) Signed: 09/03/2022 3:42:34 PM By: Fredirick Maudlin MD FACS Entered By: Fredirick Maudlin on 09/03/2022 15:42:33 -------------------------------------------------------------------------------- Physician Orders Details Patient Name: Date of  Service: [name] [name] 09/03/2022 3:00 PM Medical Record Number: [id number] Patient Account Number: [id number] Date of Birth/Sex: Treating RN: 1941-06-20 (81 y.o. 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 Edema Control - Lymphedema / SCD / Other Bilateral Lower Extremities Avoid standing for long periods of time. Patient to wear own compression stockings every day. Moisturize legs daily. Compression stocking or Garment 30-40 mm/Hg pressure to: Electronic Signature(s) Signed: 09/03/2022 4:22:09 PM By: Fredirick Maudlin MD FACS Signed: 09/03/2022 4:57:50 PM By: Blanche East RN Previous Signature: 09/03/2022 3:42:42 PM Version By: Fredirick Maudlin MD FACS Entered By: Blanche East on 09/03/2022 15:50:57 -------------------------------------------------------------------------------- Problem List Details Patient Name: [name]: [name] [name] 09/03/2022 3:00 PM Medical Record Number: [id number] Patient Account Number: [id number] Date of Birth/Sex: Treating RN: 06-11-1941 (81 y.o. M) Primary Care Provider: [name] Other Clinician: Referring Provider: Treating Provider/Extender: Benedict Needy Weeks in Treatment: [address], [place] ([id number]) [id number] Page 4 of 8 Active Problems ICD-10 Encounter Code  Description Active Date MDM Diagnosis L89.512 Pressure ulcer of right ankle, stage 2 07/10/2022 No Yes N18.30 Chronic kidney disease, stage 3 unspecified 07/10/2022 No Yes I87.2 Venous insufficiency (chronic) (peripheral) 07/10/2022 No Yes L97.812 Non-pressure chronic ulcer of other part of right lower leg with fat layer 07/28/2022 No Yes exposed L97.822 Non-pressure chronic ulcer of other part of left lower leg with fat layer exposed10/02/2022 No Yes Inactive Problems ICD-10 Code Description Active Date Inactive Date L97.512 Non-pressure chronic ulcer of other part of right foot with fat layer exposed 07/10/2022 07/10/2022 T81.31XS Disruption of external operation (surgical) wound, not elsewhere classified, sequela 07/10/2022 07/10/2022 Resolved Problems Electronic Signature(s) Signed: 09/03/2022 3:40:19 PM By: Fredirick Maudlin MD FACS Entered By: Fredirick Maudlin on 09/03/2022 15:40:19 -------------------------------------------------------------------------------- Progress Note Details Patient Name: [name]: [name] [name] 09/03/2022 3:00 PM Medical Record Number: [id number] Patient Account Number: [id number] Date of Birth/Sex: Treating RN: 1940-12-19 (81 y.o. 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 He has been getting this dressed that [employer] and he had Xeroform on this with gauze. The patient is not exactly sure how this came about but he is not able to dress the wound himself because of limitations due to what sounds like spinal stenosis and pain. He had a wound on the ankle as well but that healed over. He was seen by vascular in May 2021. This was related to a right great toe wound. He had an angiogram that was completely normal on both sides good perfusion right into the feet. He also looks like he has lymphedema and he has compression stockings but he cannot get them on and off so he essentially leaves he is on even when he showering Past medical history includes prediabetes, low back pain secondary I think the lumbar spinal stenosis has been offered a decompressive laminectomy but he has not gone forth with this. 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 ABI was noncompressible in our clinic on the right right first toe. 4/7; patient I admitted the clinic last week. He has an area on the tip of his right first toe. We cleaned this off last week and have been using silver collagen. He is in the same crocs today that he wore last week and I told him not to use. For 1 reason or another he could not handle a surgical sandal. 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 He is not a diabetic 4/12; right first toe wound chronic. We have been using silver collagen. Once again he comes in then the same pair of old crocs that he comes in every week. 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 His x-ray suggested suspicious for osteomyelitis with bone irregularity at the tuft of the right great toe 4/21; patient has been using  silver collagen every other day to the wound on his right great toe. He continues to wear crocs. He has no complaints or issues today. 5/5; 2-week follow-up. 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 Mild soft tissue edema with enhancement at the distal aspect of the great toe suggestive of cellulitis. Fortunately the wound actually looks better. Has been using Hydrofera Blue 5/12; started him on Augmentin last week 875 twice daily he is tolerating this well. Last creatinine I see is 1.77 in early April. We are using Hydrofera Blue to the wound which actually looks better today. 5/17; tolerating the Augmentin well. He says the wound is actually been hurting him episodically. Actually the surface of this looks quite good. He did not get his lab work done. We are using [facility] he is changing this himself 6/2; still taking Augmentin he should be rounding into his last week. This was empirically for underlying osteomyelitis. The area on the tip of his  toe is still open still with some depth but no palpable bone. We have been using Hydrofera Blue. Surface area of the wound has not been making much improvement Is work was essentially unremarkable except for a creatinine of 1.79. Reason for his renal insufficiency is not clear he is not a diabetic does not have hypertension however he does take NSAIDs and Lasix I wonder if this is the issue here. Note that his creatinine on 11/12/2018 was 1.45. Platelet count slightly low at 143 however his inflammatory markers were really in the normal range at 10 and 2 sedimentation rate and C-reactive protein respectively. 6/9; he apparently had another 2 weeks of Augmentin after we look that this last time. Therefore he should be coming into his last week next week. We are using polymen on the tip of the toe. He cannot get down to do the dressing so he comes in for a nurse visit. He comes in in crocs I have  warned him against this although he says he is using a different shoe for most of the day he works in [employer]. 6/16; he comes back in in crocs. I have spoken him about this before. His areas on the tip of his toe this actually looks quite good we have been using polymen. He has completed his antibiotics which we empirically gave him for underlying osteomyelitis. Everything looks better to me in spite of his noncompliance with footwear recommendation 7/11; patient has been followed through nurse visits. He was last seen by provider on 6/16. He reports improvement to the wound with the use of PolyMem. He denies signs of infection. 7/21; the patient arrives with the area on the tip of his right great toe completely healed. He has the same proximal on that I see every time. I treated him empirically with Augmentin for osteomyelitis. He needs to be vigilant about this. READMISSION 03/19/2022 The patient returns with a reopening of the right great toe wound. Apparently this occurred in around October of last year. He was followed by podiatry for some time. They have performed x-rays but unfortunately, we do not have access to them or the results. No report as to whether or not he might have osteomyelitis. In addition, he has a new wound on the medial aspect of the right great toe as well as the anterior tibial surface of his right lower leg. He does state that he was wearing compression stockings when this tibial wound occurred. He continues to wear crocs. 04/02/2022: For some reason, the patient has not been seen in 2 weeks. He says that he had to reschedule his visit. Therefore his Coflex compression wrap was left in situ for 14 days. It appears that his skin became macerated and he has opened up several new superficial wounds on his right anterior tibial surface. We did get an x-ray to evaluate for osteomyelitis and none was seen. All of the pre-existing wounds are smaller today and the new wounds  look like they are limited to just the breakdown of skin. 04/10/2022: The wounds on his right anterior tibial surface are actually a little bit larger today. He seems to have some skin irritation and maceration in the area again. The tip of his toe is nearly closed as is the wound on the dorsal surface of his right great toe. READMISSION 07/10/2022 Since our last visit, the patient went to the operating room to have a left hip replacement, but in the preop holding area, his surgeon noted a  foul odor coming from his right foot. He contacted me and described the findings of his right great toe and we mutually agreed, along with the patient, that amputation would be most appropriate. 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 Since his surgery, he has been wearing compression stockings bilaterally, but as he is unable to apply or remove them himself, he wears them for a week at a time. This appears to have resulted in a pressure injury on his right anterior ankle at the point of flexion; he has a similar looking abrasion that has not broken the skin in the same site on the left. 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 07/21/2022: The right anterior ankle wound is nearly closed aside from a small open area that has some slough accumulation. The amputation site has crusting and eschar present. Once this was removed, it was revealed that the amputation site has healed. The abrasion on the left anterior ankle at the point of flexion is no longer present. 07/28/2022: The patient dropped a door on his leg and has a new wound to his right anterior tibial surface. There is some nonviable skin hanging from the cranial portion of the wound. The fat layer is exposed. The right anterior ankle wound is a little bit smaller and has some accumulated slough present. 08/04/2022: The wound on his right anterior tibial surface is a  little bit smaller today. There is still slough accumulation. The right anterior ankle wound is quite a bit smaller today and also has slough buildup on the surface. 08/13/2022: The patient dropped another object that struck him in the left anterior tibial surface. This has resulted in wound at this location. It is fairly superficial but does involve the fat layer. The right anterior tibial surface wound has a little bit of eschar on the surface and underneath, it is quite small. The right anterior ankle wound has also contracted but does have slough and eschar buildup as well. 08/21/2022: The right anterior tibial wound has healed. The left anterior tibial wound is quite a bit smaller and more superficial with just a little eschar on the surface. The right anterior ankle wound also continues to contract but continues to accumulate slough and eschar, as well. 08/27/2022: The left anterior tibial wound is closed. The right anterior ankle wound is considerably smaller with just a little eschar and slough buildup. 09/03/2022: His wounds are all healed. Patient History Information obtained from Patient. KAMIR, SELOVER ([id number]) [id number] Page 6 of 8 Family History Diabetes - Maternal Grandparents, Heart Disease - Father, Hypertension - Father, Stroke - Father, Thyroid Problems - Mother,Child, No family history of Cancer, Hereditary Spherocytosis, Kidney Disease, Lung Disease, Seizures, Tuberculosis. Social History Never smoker, Marital Status - Single, Alcohol Use - Rarely, Drug Use - No History, Caffeine Use - Daily - coffee. Medical History Eyes Patient has history of Cataracts - 2019 Ear/Nose/Mouth/Throat Patient has history of Chronic sinus problems/congestion Respiratory Patient has history of Asthma, Sleep Apnea Cardiovascular Patient has history of Hypertension, Peripheral Venous Disease Endocrine Denies history of Type II  Diabetes Musculoskeletal Patient has history of Osteoarthritis Medical A Surgical History Notes nd Gastrointestinal GERD Genitourinary BPH Musculoskeletal Degenerative disc disease Neurologic Neuromuscular disorder Objective Constitutional Hypertensive, asymptomatic. No acute distress.. Vitals Time Taken: 3:25 PM, Height: 68 in, Weight: 245 lbs, BMI: 37.2, Temperature: 97.9 F, Pulse: 95 bpm, Respiratory Rate: 18 breaths/min, Blood Pressure:  154/86 mmHg. Respiratory Normal work of breathing on room air.. General Notes: 09/03/2022: All wounds are healed. Integumentary (Hair, Skin) Wound #7 status is Open. Original cause of wound was Footwear Injury. The date acquired was: 07/03/2022. The wound has been in treatment 7 weeks. The wound is located on the Right,Anterior Ankle. The wound measures 0.3cm length x 0.4cm width x 0.1cm depth; 0.094cm^2 area and 0.009cm^3 volume. There is Fat Layer (Subcutaneous Tissue) exposed. There is no tunneling or undermining noted. There is a small amount of serous drainage noted. The wound margin is flat and intact. There is large (67-100%) red, pink granulation within the wound bed. There is a small (1-33%) amount of necrotic tissue within the wound bed including Eschar. The periwound skin appearance had no abnormalities noted for texture. The periwound skin appearance had no abnormalities noted for moisture. The periwound skin appearance had no abnormalities noted for color. Periwound temperature was noted as No Abnormality. 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 He does still require assistance with his compression  stockings so we will help him apply them today. We will also put some padding at the ankles to avoid recurrence of the bunching and friction related injury that occurred. We will discharge him from the wound care center. TOUA, STITES ([id number]) [id number] Page 7 of 8 He may follow-up as needed. Electronic Signature(s) Signed: 09/03/2022 3:43:16 PM By: Fredirick Maudlin MD FACS Entered By: Fredirick Maudlin on 09/03/2022 15:43:16 -------------------------------------------------------------------------------- HxROS Details Patient Name: [name]: [name] [name] 09/03/2022 3:00 PM Medical Record Number: [id number] Patient Account Number: [id number] Date of Birth/Sex: Treating RN: August 23, 1941 (81 y.o. M) Primary Care Provider: [name] Other Clinician: Referring Provider: Treating Provider/Extender: Benedict Needy Weeks in Treatment: 7 Information Obtained From Patient Eyes Medical History: Positive for: Cataracts - 2019 Ear/Nose/Mouth/Throat Medical History: Positive for: Chronic sinus problems/congestion Respiratory Medical History: Positive for: Asthma; Sleep Apnea Cardiovascular Medical History: Positive for: Hypertension; Peripheral Venous Disease Gastrointestinal Medical History: Past Medical History Notes: GERD Endocrine Medical History: Negative for: Type II Diabetes Genitourinary Medical History: Past Medical History Notes: BPH Musculoskeletal Medical History: Positive for: Osteoarthritis Past Medical History Notes: Degenerative disc disease Neurologic Medical History: Past Medical History Notes: Neuromuscular disorder ISEAH, PLOUFF ([id number]) [id number] Page 8 of 8 HBO Extended History Items Ear/Nose/Mouth/Throat: Eyes: Chronic sinus Cataracts problems/congestion Immunizations Pneumococcal Vaccine: Received Pneumococcal Vaccination: Yes Received  Pneumococcal Vaccination On or After 60th Birthday: No Implantable Devices None Family and Social History Cancer: No; Diabetes: Yes - Maternal Grandparents; Heart Disease: Yes - Father; Hereditary Spherocytosis: No; Hypertension: Yes - Father; Kidney Disease: No; Lung Disease: No; Seizures: No; Stroke: Yes - Father; Thyroid Problems: Yes - Mother,Child; Tuberculosis: No; Never smoker; Marital Status - Single; Alcohol Use: Rarely; Drug Use: No History; Caffeine Use: Daily - coffee; Financial Concerns: No; Food, Clothing or Shelter Needs: No; Support System Lacking: No; Transportation Concerns: No Electronic Signature(s) Signed: 09/03/2022 3:44:07 PM By: Fredirick Maudlin MD FACS Entered By: Fredirick Maudlin on 09/03/2022 15:41:58 -------------------------------------------------------------------------------- SuperBill Details Patient Name: [name]: [name] [name] 09/03/2022 Medical Record Number: [id number] Patient Account Number: [id number] Date of Birth/Sex: Treating RN: 01/24/1941 (81 y.o. M) Primary Care  Provider: [name] Other Clinician: Referring Provider: Treating Provider/Extender: Benedict Needy Weeks in Treatment: 7 Diagnosis Coding ICD-10 Codes Code Description [phone] Pressure ulcer of right ankle, stage 2 N18.30 Chronic kidney disease, stage 3 unspecified I87.2 Venous insufficiency (chronic) (peripheral) L97.812 Non-pressure chronic ulcer of other part of right lower leg with fat layer exposed L97.822 Non-pressure chronic ulcer of other part of left lower leg with fat layer exposed Facility Procedures : CPT4 Code: [id number] Description: 03709 - WOUND CARE VISIT-LEV 2 EST PT Modifier: 25 Quantity: 1 Physician Procedures : CPT4 Code Description Modifier [id number] - WC PHYS LEVEL 3 - EST PT ICD-10 Diagnosis Description L89.512 Pressure ulcer of right ankle, stage 2 I87.2 Venous insufficiency (chronic) (peripheral) N18.30 Chronic  kidney disease, stage 3 unspecified Quantity: 1 Electronic Signature(s) Signed: 09/03/2022 4:22:09 PM By: Fredirick Maudlin MD FACS Signed: 09/03/2022 4:57:50 PM By: Blanche East RN Previous Signature: 09/03/2022 3:43:30 PM Version By: Fredirick Maudlin MD FACS Entered By: Blanche East on 09/03/2022 15:54:52

## 2022-09-08 NOTE — Procedures (Signed)
Lumbosacral Transforaminal Epidural Steroid Injection - Sub-Pedicular Approach with Fluoroscopic Guidance  Patient: [name]      Date of Birth: 02-12-1941 MRN: [id number] PCP: Cari Caraway, MD      Visit Date: 09/01/2022   Universal Protocol:    Date/Time: 09/01/2022  Consent Given By: the patient  Position: PRONE  Additional Comments: Vital signs were monitored before and after the procedure. Patient was prepped and draped in the usual sterile fashion. The correct patient, procedure, and site was verified.   Injection Procedure Details:   Procedure diagnoses: Lumbar radiculopathy [M54.16]    Meds Administered:  Meds ordered this encounter  Medications   methylPREDNISolone acetate (DEPO-MEDROL) injection 80 mg    Laterality: Right  Location/Site: L4  Needle:5.0 in., 22 ga.  Short bevel or Quincke spinal needle  Needle Placement: Transforaminal  Findings:    -Comments: Excellent flow of contrast along the nerve, nerve root and into the epidural space.  Procedure Details: After squaring off the end-plates to get a true AP view, the C-arm was positioned so that an oblique view of the foramen as noted above was visualized. The target area is just inferior to the "nose of the scotty dog" or sub pedicular. The soft tissues overlying this structure were infiltrated with 2-3 ml. of 1% Lidocaine without Epinephrine.  The spinal needle was inserted toward the target using a "trajectory" view along the fluoroscope beam.  Under AP and lateral visualization, the needle was advanced so it did not puncture dura and was located close the 6 O'Clock position of the pedical in AP tracterory. Biplanar projections were used to confirm position. Aspiration was confirmed to be negative for CSF and/or blood. A 1-2 ml. volume of Isovue-250 was injected and flow of contrast was noted at each level. Radiographs were obtained for documentation purposes.   After attaining the desired flow  of contrast documented above, a 0.5 to 1.0 ml test dose of 0.25% Marcaine was injected into each respective transforaminal space.  The patient was observed for 90 seconds post injection.  After no sensory deficits were reported, and normal lower extremity motor function was noted,   the above injectate was administered so that equal amounts of the injectate were placed at each foramen (level) into the transforaminal epidural space.   Additional Comments:  The patient tolerated the procedure well Dressing: 2 x 2 sterile gauze and Band-Aid    Post-procedure details: Patient was observed during the procedure. Post-procedure instructions were reviewed.  Patient left the clinic in stable condition.

## 2022-09-08 NOTE — Progress Notes (Signed)
[name] - 81 y.o. male MRN [id number]  Date of birth: May 11, 1941  Office Visit Note: Visit Date: 09/01/2022 PCP: Cari Caraway, MD Referred by: Cari Caraway, MD  Subjective: Chief Complaint  Patient presents with   Lower Back - Pain   HPI:  [name] is a 81 y.o. male who comes in today at the request of Barnet Pall, FNP for planned Right L4-5 Lumbar Transforaminal epidural steroid injection with fluoroscopic guidance.  The patient has failed conservative care including home exercise, medications, time and activity modification.  This injection will be diagnostic and hopefully therapeutic.  Please see requesting physician notes for further details and justification.   ROS Otherwise per HPI.  Assessment & Plan: Visit Diagnoses:    ICD-10-CM   1. Lumbar radiculopathy  M54.16 XR C-ARM NO REPORT    Epidural Steroid injection    methylPREDNISolone acetate (DEPO-MEDROL) injection 80 mg    2. 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   Meds & Orders:  Meds ordered this encounter  Medications   methylPREDNISolone acetate (DEPO-MEDROL) injection 80 mg    Orders Placed This Encounter  Procedures   XR C-ARM NO REPORT   Epidural Steroid injection    Follow-up: Return for visit to requesting provider as needed.   Procedures: No procedures performed  Lumbosacral Transforaminal Epidural Steroid Injection - Sub-Pedicular Approach with Fluoroscopic Guidance  Patient: [name]      Date of Birth: July 06, 1941 MRN: [id number] PCP: Cari Caraway, MD      Visit Date: 09/01/2022   Universal Protocol:    Date/Time: 09/01/2022  Consent Given By: the patient  Position: PRONE  Additional Comments: Vital signs were monitored before and after the procedure. Patient was prepped and draped in the usual sterile  fashion. The correct patient, procedure, and site was verified.   Injection Procedure Details:   Procedure diagnoses: Lumbar radiculopathy [M54.16]    Meds Administered:  Meds ordered this encounter  Medications   methylPREDNISolone acetate (DEPO-MEDROL) injection 80 mg    Laterality: Right  Location/Site: L4  Needle:5.0 in., 22 ga.  Short bevel or Quincke spinal needle  Needle Placement: Transforaminal  Findings:    -Comments: Excellent flow of contrast along the nerve, nerve root and into the epidural space.  Procedure Details: After squaring off the end-plates to get a true AP view, the C-arm was positioned so that an oblique view of the foramen as noted above was visualized. The target area is just inferior to the "nose of the scotty dog" or sub pedicular. The soft tissues overlying this structure were infiltrated with 2-3 ml. of 1% Lidocaine without Epinephrine.  The spinal needle was inserted toward the target using a "trajectory" view along the fluoroscope beam.  Under AP and lateral visualization, the needle was advanced so it did not puncture dura and was located close the 6 O'Clock position of the pedical in AP tracterory. Biplanar projections were used to confirm position. Aspiration was confirmed to be negative for CSF and/or blood. A 1-2 ml. volume of Isovue-250 was injected and flow of contrast was noted at each level. Radiographs were obtained for documentation purposes.   After attaining the desired flow of contrast documented above, a 0.5 to 1.0 ml test dose of 0.25% Marcaine was injected into each  respective transforaminal space.  The patient was observed for 90 seconds post injection.  After no sensory deficits were reported, and normal lower extremity motor function was noted,   the above injectate was administered so that equal amounts of the injectate were placed at each foramen (level) into the transforaminal epidural space.   Additional Comments:  The  patient tolerated the procedure well Dressing: 2 x 2 sterile gauze and Band-Aid    Post-procedure details: Patient was observed during the procedure. Post-procedure instructions were reviewed.  Patient left the clinic in stable condition.    Clinical History: CT LUMBAR MYELOGRAM FINDINGS:     T10-11: No stenosis. Facet degeneration and ligamentous  calcification. Solid bridging anterior osteophytes.     T11-12: No stenosis.     T12-L1: Mild bulging of the disc. No compressive stenosis. Conus tip  at this level.     L1-2: 2 mm retrolisthesis. Moderate multifactorial spinal stenosis.  Disc space narrowing. Endplate osteophytes. Facet and ligamentous  hypertrophy. Foraminal stenosis on both sides that could compress  the L1 nerves.     L2-3: 2 mm retrolisthesis. Chronic disc degeneration with loss of  disc height and vacuum phenomenon. Endplate osteophytes. Facet and  ligamentous hypertrophy. Severe multifactorial spinal stenosis  likely to cause neural compression. Bilateral foraminal stenosis  that could compress the L2 nerves.     L3-4: 2 mm retrolisthesis. Endplate osteophytes and bulging of the  disc. Facet and ligamentous hypertrophy. Severe multifactorial  spinal stenosis likely to cause neural compression. Bilateral  foraminal stenosis that could affect the exiting L3 nerves.     L4-5: Chronic facet arthropathy with anterolisthesis of 1 cm. Disc  degeneration with disc space narrowing and vacuum phenomenon.  Endplate osteophytes and bulging of the disc. Severe stenosis of the  central canal and both neural foramina likely to cause neural  compression.     L5-S1: Chronic disc degeneration with loss of disc height and vacuum  phenomenon. Endplate osteophytes. Facet hypertrophy and degeneration  with some ligamentous calcification. Severe stenosis of the  subarticular lateral recesses and neural foramina.     IMPRESSION:  Advanced chronic multilevel degenerative  disease throughout the  lumbar region. Curvature convex to the left in the thoracolumbar  junction region and towards the right in the lower lumbar region. 2  mm retrolisthesis L1-2, L2-3 and L3-4. 1 cm anterolisthesis L4-5.     Severe multifactorial spinal stenosis and neural foraminal stenosis  likely to cause neural compression at L2-3, L3-4, L4-5 and L5-S1.  Moderate multifactorial canal stenosis at L1-2. Bilateral foraminal  stenosis at that level.        Electronically Signed    By: Nelson Chimes M.D.    On: 12/06/2019 10:12     Objective:  VS:  HT:    WT:   BMI:     BP:(!) 150/72  HR:76bpm  TEMP: ( )  RESP:  Physical Exam Vitals and nursing note reviewed.  Constitutional:      General: He is not in acute distress.    Appearance: Normal appearance. He is obese. He is not ill-appearing.  HENT:     Head: Normocephalic and atraumatic.     Right Ear: External ear normal.     Left Ear: External ear normal.     Nose: No congestion.  Eyes:     Extraocular Movements: Extraocular movements intact.  Cardiovascular:     Rate and Rhythm: Normal rate.     Pulses: Normal pulses.  Pulmonary:  Effort: Pulmonary effort is normal. No respiratory distress.  Abdominal:     General: There is no distension.     Palpations: Abdomen is soft.  Musculoskeletal:        General: No tenderness or signs of injury.     Cervical back: Neck supple.     Right lower leg: No edema.     Left lower leg: No edema.     Comments: Patient has good distal strength without clonus. Right foot drop.  Skin:    Findings: No erythema or rash.  Neurological:     General: No focal deficit present.     Mental Status: He is alert and oriented to person, place, and time.     Sensory: No sensory deficit.     Motor: No weakness or abnormal muscle tone.     Coordination: Coordination normal.  Psychiatric:        Mood and Affect: Mood normal.        Behavior: Behavior normal.      Imaging: No results  found.

## 2022-09-10 ENCOUNTER — Ambulatory Visit (INDEPENDENT_AMBULATORY_CARE_PROVIDER_SITE_OTHER): Payer: Medicare Other | Admitting: Podiatry

## 2022-09-10 DIAGNOSIS — B351 Tinea unguium: Secondary | ICD-10-CM

## 2022-09-10 DIAGNOSIS — M79675 Pain in left toe(s): Secondary | ICD-10-CM | POA: Diagnosis not present

## 2022-09-10 DIAGNOSIS — M79674 Pain in right toe(s): Secondary | ICD-10-CM

## 2022-09-10 NOTE — Progress Notes (Signed)
   Chief Complaint  Patient presents with   Nail Problem    Nail trim   Callouses    Callus to arch on left foot     SUBJECTIVE Patient presents to office today complaining of elongated, thickened nails that cause pain while ambulating in shoes.  Patient is unable to trim their own nails.  Patient also had recent toe amputation to the right great toe performed inpatient.  He would like to have it evaluated to make sure it is healing appropriately.  He does have a long history at the wound care center for multiple recurrent wounds.  Patient is here for further evaluation and treatment.  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 Derm Skin is dry and supple bilateral. Negative open lesions or macerations. Remaining integument unremarkable. Nails are tender, long, thickened and dystrophic with subungual debris, consistent with onychomycosis, 1-5 bilateral. No signs of infection noted. Vasc  DP and PT pedal pulses palpable bilaterally. Temperature gradient within  normal limits.  Neuro Epicritic and protective threshold sensation grossly intact bilaterally.  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  H/o partial toe amputation right great toe  PLAN OF CARE 1. Patient evaluated today.  2. Instructed to maintain good pedal hygiene and foot care.  3. Mechanical debridement of nails 1-5 bilaterally performed using a nail nipper. Filed with dremel without incident.  4.  Continue compression hose daily  5.  Return to clinic p.o. RN   Edrick Kins, DPM [facility]  Dr. Edrick Kins, DPM    [address], [place] [address]                Office [phone]  Fax [phone]

## 2022-09-12 ENCOUNTER — Telehealth: Payer: Self-pay

## 2022-09-12 NOTE — Telephone Encounter (Signed)
   Name: [name]  DOB: 04-28-1941  MRN: [id number]  Primary Cardiologist: Quay Burow, MD   Preoperative team, please contact this patient and set up a phone call appointment for further preoperative risk assessment. Please obtain consent and complete medication review. Thank you for your help.  I confirm that guidance regarding antiplatelet and oral anticoagulation therapy has been completed and, if necessary, noted below (none requested).    Lenna Sciara, NP 09/12/2022, 4:58 PM [place]

## 2022-09-12 NOTE — Telephone Encounter (Signed)
1st attempt to reach pt to schedule tele visit. No answer. Lvm

## 2022-09-12 NOTE — Telephone Encounter (Signed)
   Pre-operative Risk Assessment    Patient Name: [name]  DOB: 1941-07-19 MRN: [id number]      Request for Surgical Clearance    Procedure:   RIGHT CARPAL TUNNEL RELEASE  Date of Surgery:  Clearance TBD                                 Surgeon:  DR Berenice Primas Surgeon's Group or Practice Name:  [facility] Phone number:  [phone] Fax number:  [phone]   Type of Clearance Requested:   - Medical PLEASE PROVIDE COMPLETED SURGICAL CLEARANCE FROM ALONG WITH LATEST OFFICE NOTES   Type of Anesthesia:  General    Additional requests/questions:  Please fax a copy of SURGICAL CLEARANCE to the surgeon's office.  Signed, Jeanmarie Plant Renad Jenniges  CCMA 09/12/2022, 4:39 PM

## 2022-09-15 ENCOUNTER — Telehealth: Payer: Self-pay

## 2022-09-15 NOTE — Telephone Encounter (Signed)
Contacted the patient and schedule a telehealth appointment for this week. Patient agreeable and voiced understanding.   Consent given and med list reviewed.

## 2022-09-15 NOTE — Telephone Encounter (Signed)
  Patient Consent for Virtual Visit         [name] has provided verbal consent on 09/15/2022 for a virtual visit (video or telephone).   CONSENT FOR VIRTUAL VISIT FOR:  [name]  By participating in this virtual visit I agree to the following:  I hereby voluntarily request, consent and authorize [facility] and its employed or contracted physicians, physician assistants, nurse practitioners or other licensed health care professionals (the Practitioner), to provide me with telemedicine health care services (the "Services") as deemed necessary by the treating Practitioner. I acknowledge and consent to receive the Services by the Practitioner via telemedicine. I understand that the telemedicine visit will involve communicating with the Practitioner through live audiovisual communication technology and the disclosure of certain medical information by electronic transmission. I acknowledge that I have been given the opportunity to request an in-person assessment or other available alternative prior to the telemedicine visit and am voluntarily participating in the telemedicine visit.  I understand that I have the right to withhold or withdraw my consent to the use of telemedicine in the course of my care at any time, without affecting my right to future care or treatment, and that the Practitioner or I may terminate the telemedicine visit at any time. I understand that I have the right to inspect all information obtained and/or recorded in the course of the telemedicine visit and may receive copies of available information for a reasonable fee.  I understand that some of the potential risks of receiving the Services via telemedicine include:  Delay or interruption in medical evaluation due to technological equipment failure or disruption; Information transmitted may not be sufficient (e.g. poor resolution of images) to allow for appropriate medical decision making by the  Practitioner; and/or  In rare instances, security protocols could fail, causing a breach of personal health information.  Furthermore, I acknowledge that it is my responsibility to provide information about my medical history, conditions and care that is complete and accurate to the best of my ability. I acknowledge that Practitioner's advice, recommendations, and/or decision may be based on factors not within their control, such as incomplete or inaccurate data provided by me or distortions of diagnostic images or specimens that may result from electronic transmissions. I understand that the practice of medicine is not an exact science and that Practitioner makes no warranties or guarantees regarding treatment outcomes. I acknowledge that a copy of this consent can be made available to me via my patient portal ([facility]), or I can request a printed copy by calling the office of [facility].    I understand that my insurance will be billed for this visit.   I have read or had this consent read to me. I understand the contents of this consent, which adequately explains the benefits and risks of the Services being provided via telemedicine.  I have been provided ample opportunity to ask questions regarding this consent and the Services and have had my questions answered to my satisfaction. I give my informed consent for the services to be provided through the use of telemedicine in my medical care

## 2022-09-17 ENCOUNTER — Ambulatory Visit (INDEPENDENT_AMBULATORY_CARE_PROVIDER_SITE_OTHER): Payer: Medicare Other | Admitting: Podiatry

## 2022-09-17 DIAGNOSIS — L97511 Non-pressure chronic ulcer of other part of right foot limited to breakdown of skin: Secondary | ICD-10-CM | POA: Diagnosis not present

## 2022-09-17 NOTE — Progress Notes (Signed)
Chief Complaint  Patient presents with   Wound Check    PT has a new open wound of right toe.    Foot Swelling    Follow up bilateral edema. Per pt he is in for weekly check of edema. During check in noticed pt compressions are very tight and leaving indention in feet. Pt also has a new open wound of right toe that is bleeding and draining through support hoes. Pt did notice.     SUBJECTIVE Patient presents today for follow-up evaluation of a wound to the right second toe and amputation site to the right great toe.  Patient has not change the dressings or evaluated or wash his feet since last visit.  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   Hyperlipidemia    Joint pain    Neuromuscular disorder ([place])    Pre-diabetes    Prediabetes    Sleep apnea    uses CPAP nightly   SOB (shortness of breath)    Swallowing difficulty    Swelling of lower extremity    right more than leg leg   Umbilical hernia     No Known Allergies    OBJECTIVE General Patient is awake, alert, and oriented x 3 and in no acute distress. Derm right great toe amputation site has healed.  There is a superficial wound to the dorsal aspect of the right second toe.  Please see above noted photo.  Clinically there is no indication of infection Vasc  DP and PT pedal pulses palpable bilaterally. Temperature  gradient within normal limits.  Neuro Epicritic and protective threshold sensation grossly intact bilaterally.  Musculoskeletal Exam partial toe amputation right great toe.  Chronic bilateral lower extremity PVD  ASSESSMENT 1.  Ulcer dorsal aspect of the right second toe 2.  H/o partial toe amputation right great toe  PLAN OF CARE 1. Patient evaluated today.  2.  Antibiotic Silvadene cream and a light dressing was applied to the right second toe.  Compressive wraps were applied to the right lower extremity all the way up to the level of the knee 3.  Offloading felt pad was applied to the plantar aspect of the left foot as well as his compression hose 4.  Patient states that he has been seen weekly at either the wound care center or here in the office for lower extremity care.  He is unable to dress or care for his feet.  Unfortunately weekly visits are not a long-term solution for the patient.  I will see the patient next week for wound evaluation but eventually we will need to arrange home health care weekly if insurance allows 5.  Return to clinic 1 week for wound evaluation  of the right second toe  Edrick Kins, DPM [facility]  Dr. Edrick Kins, DPM    [address], [place] [address]                Office [phone]  Fax [phone]

## 2022-09-18 NOTE — Progress Notes (Signed)
Virtual Visit via Telephone Note   Because of [name]'s co-morbid illnesses, he is at least at moderate risk for complications without adequate follow up.  This format is felt to be most appropriate for this patient at this time.  The patient did not have access to video technology/had technical difficulties with video requiring transitioning to audio format only (telephone).  All issues noted in this document were discussed and addressed.  No physical exam could be performed with this format.  Please refer to the patient's chart for his consent to telehealth for [facility].  Evaluation Performed:  Preoperative cardiovascular risk assessment _____________   Date:  09/18/2022   Patient ID:  [name], DOB June 24, 1941, MRN [id number] Patient Location:  Home Provider location:   Office  Primary Care Provider:  Cari Caraway, Hanson Primary Cardiologist:  [name] Burow, Hanson  Chief Complaint / Patient Profile   81 y.o. y/o male with a h/o CKD stage III, OSA, HLD, HTN, normal coronary arteries 2008 who is pending right carpal tunnel release and presents today for telephonic preoperative cardiovascular risk assessment.  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   Hyperlipidemia    Joint pain    Neuromuscular disorder (HCC)    Pre-diabetes    Prediabetes    Sleep apnea    uses CPAP nightly    SOB (shortness of breath)    Swallowing difficulty    Swelling of lower extremity    right more than leg leg   Umbilical hernia    Past Surgical History:  Procedure Laterality Date   ABDOMINAL AORTOGRAM W/LOWER EXTREMITY N/A 03/14/2020   Procedure: ABDOMINAL AORTOGRAM W/LOWER EXTREMITY;  Surgeon: [name] Mitchell, Hanson;  Location: [facility];  Service: Vascular;  Laterality: N/A;   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   AMPUTATION TOE Right 04/18/2022   Procedure: AMPUTATION RIGHT GREAT TOE;  Surgeon: [name] Leitz, Hanson;  Location: [facility];  Service: Orthopedics;  Laterality: Right;   BACK SURGERY  2003   L4, L5   BACK SURGERY     CATARACT EXTRACTION, BILATERAL     ENDOVENOUS ABLATION SAPHENOUS VEIN W/ LASER Right 06-01-2014   EVLA right greater saphenous vein by [name] Jews Hanson    epidural steroid injections        [facility] dr [name]   hernia repair  2003   HERNIA REPAIR     hernia inguinal x 2   TOTAL HIP ARTHROPLASTY Left 05/05/2022   Procedure: LEFT TOTAL HIP ARTHROPLASTY ANTERIOR APPROACH;  Surgeon: [name] Leitz, Hanson;  Location: [facility];  Service: Orthopedics;  Laterality: Left;   TOTAL KNEE ARTHROPLASTY Left 03/22/2015   Procedure:  TOTAL KNEE ARTHROPLASTY;  Surgeon: [name] Pel, Hanson;  Location: [facility];  Service: Orthopedics;  Laterality: Left;   UMBILICAL HERNIA REPAIR N/A 06/27/2016   Procedure: LAPAROSCOPIC ASSISTED REPAIR OF UMBILICAL HERINA WITH MESH;  Surgeon: [name] Hausen, Hanson;  Location: [facility];  Service: General;  Laterality: N/A;   VASCULAR SURGERY  2015   right leg    Allergies  No Known Allergies  History of Present Illness    [name] is a 81 y.o. male who presents via audio/video conferencing for a telehealth visit today.  Pt was last seen in cardiology clinic on 04/17/22 by [name] Deforest, PA.  At that time [name] was doing well and was cleared for hip replacement.  The patient is now pending procedure as outlined above.  Since his last visit, he  denies chest pain, shortness of breath, lower extremity edema, fatigue, palpitations, melena, hematuria, hemoptysis, diaphoresis, weakness, presyncope, syncope, orthopnea, and PND. 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   Home Medications    Prior to Admission medications   Medication Sig Start Date End Date Taking? Authorizing Provider  acetaminophen (TYLENOL) 325 MG tablet Take 1-2 tablets (325-650 mg total) by mouth every 6 (six) hours as needed for mild pain (pain score 1-3 or temp > 100.5). 04/23/22   [name]  albuterol (VENTOLIN HFA) 108 (90 Base) MCG/ACT inhaler Inhale 2 puffs into the lungs every 6 (six) hours as needed for wheezing or shortness of breath. 02/03/22   [name]  aspirin EC 325 MG tablet Take 1 tablet (325 mg total) by mouth 2 (two) times daily after a meal. Take until one month post op for DVT prophlaxis 05/07/22   [name]  bisacodyl (DULCOLAX) 5 MG EC tablet Take 1 tablet (5 mg total) by mouth daily as needed for moderate constipation. 04/23/22   [name]  budesonide-formoterol (SYMBICORT) 160-4.5 MCG/ACT inhaler Inhale 2 puffs into the lungs 2 (two) times daily. 08/21/21   [name]  buPROPion (WELLBUTRIN XL) 150 MG 24 hr tablet Take 450 mg by mouth every morning.    Provider, Historical, Hanson  CALCIUM PO Take 1 tablet by mouth every morning.    Provider, Historical, Hanson  docusate sodium (COLACE) 100 MG capsule Take 1 capsule (100 mg total) by mouth 2 (two) times daily. 04/23/22   [name]  doxazosin (CARDURA) 8 MG tablet TAKE 1 TABLET BY MOUTH DAILY  . Take at night Patient taking differently: Take 8 mg by mouth at bedtime. 03/11/16   [name] Russian, Hanson  esomeprazole (NEXIUM) 20 MG capsule Take 20 mg by mouth every morning.    Provider, Historical, Hanson  furosemide (LASIX) 40 MG tablet Take 40 mg by mouth daily.    Provider, Historical, Hanson   gabapentin (NEURONTIN) 300 MG capsule TAKE 1 TO 2 CAPSULES BY MOUTH AT BEDTIME Patient taking differently: Take 600 mg by mouth at bedtime. 01/15/17   [name], Hanson  HYDROcodone-acetaminophen (NORCO) 5-325 MG tablet Take 1-2 tablets by mouth every 4 (four) hours as needed for moderate pain (Use sparingly). 05/07/22   [name]  methocarbamol (ROBAXIN) 500 MG tablet Take 1 tablet (500 mg total) by mouth every 8 (eight) hours as needed for muscle spasms. Patient taking differently: Take 500 mg by mouth in the morning and at bedtime. 08/08/21   Loni Beckwith, Hanson  mirtazapine (REMERON) 15 MG  tablet Take 15 mg by mouth at bedtime.    Provider, Historical, Hanson  montelukast (SINGULAIR) 10 MG tablet Take 1 tablet (10 mg total) by mouth at bedtime. 01/24/22   [name]  Multiple Vitamin (MULTIVITAMIN WITH MINERALS) TABS tablet Take 1 tablet by mouth every morning.    Provider, Historical, Hanson  polyethylene glycol (MIRALAX / GLYCOLAX) 17 g packet Take 17 g by mouth daily as needed for mild constipation. 04/23/22   [name]  potassium chloride SA (K-DUR,KLOR-CON) 20 MEQ tablet Take 1 tablet (20 mEq total) by mouth 2 (two) times daily. 07/23/16   [name] Russian, Hanson  predniSONE (DELTASONE) 20 MG tablet Take '40mg'$  for 5 days 07/02/22   Marshell Garfinkel, Hanson  rosuvastatin (CRESTOR) 5 MG tablet Take 5 mg by mouth every morning. 12/20/18   Provider, Historical, Hanson    Physical Exam    Vital Signs:  [name] does not have vital signs available for review today.  Given telephonic nature of communication, physical exam is limited. AAOx3. NAD. Normal affect.  Speech and respirations are unlabored.  Accessory Clinical Findings    None  Assessment & Plan    1.  Preoperative Cardiovascular Risk Assessment: According to the Revised Cardiac Risk Index (RCRI), his Perioperative Risk of Major Cardiac Event is (%): 0.4. His Functional Capacity in METs is: 7.59 according  to the [place] Activity Status Index (DASI). The patient is doing well from a cardiac perspective. Therefore, based on ACC/AHA guidelines, the patient would be at acceptable risk for the planned procedure without further cardiovascular testing.   The patient was advised that if he develops new symptoms prior to surgery to contact our office to arrange for a follow-up visit, and he verbalized understanding.  No cardiac medications have been requested to be held.   A copy of this note will be routed to requesting surgeon.  Time:   Today, I have spent 8 minutes with the patient with telehealth technology discussing medical history, symptoms, and management plan.    Emmaline Life, Hanson-C  09/19/2022, 10:07 AM [address] Office [phone] Fax [phone]

## 2022-09-19 ENCOUNTER — Telehealth: Payer: Self-pay

## 2022-09-19 ENCOUNTER — Encounter: Payer: Self-pay | Admitting: Nurse Practitioner

## 2022-09-19 ENCOUNTER — Ambulatory Visit: Payer: Medicare Other | Attending: Interventional Cardiology | Admitting: Nurse Practitioner

## 2022-09-19 DIAGNOSIS — Z01810 Encounter for preprocedural cardiovascular examination: Secondary | ICD-10-CM

## 2022-09-19 NOTE — Telephone Encounter (Signed)
   Pre-operative Risk Assessment    Patient Name: [name]  DOB: Dec 24, 1940 MRN: [id number]      Request for Surgical Clearance    Procedure:   RIGHT CARPAL TUNNEL RELEASE  Date of Surgery:  Clearance TBD                                 Surgeon:  DR [name] Surgeon's Group or Practice Name:  [facility] Phone number:  [phone] Fax number:  [phone]   Type of Clearance Requested:   - Medical IF LABS ARE NEEDED PRIOR TO LABS   Type of Anesthesia:  General    Additional requests/questions:  Please fax a copy of CLEARANCE to the surgeon's office.  Signed, Jeanmarie Plant Zykira Matlack  CCMA 09/19/2022, 4:08 PM

## 2022-09-24 ENCOUNTER — Ambulatory Visit (INDEPENDENT_AMBULATORY_CARE_PROVIDER_SITE_OTHER): Payer: Medicare Other | Admitting: Podiatry

## 2022-09-24 DIAGNOSIS — L97511 Non-pressure chronic ulcer of other part of right foot limited to breakdown of skin: Secondary | ICD-10-CM

## 2022-09-24 DIAGNOSIS — I872 Venous insufficiency (chronic) (peripheral): Secondary | ICD-10-CM | POA: Diagnosis not present

## 2022-09-24 NOTE — Progress Notes (Signed)
   Chief Complaint  Patient presents with   Follow-up    Patient is here for follow-up for right foot great toe.    SUBJECTIVE Patient presents today for follow-up evaluation of a wound to the right second toe and amputation site to the right great toe.  Patient has not change the dressings or evaluated or wash his feet since last visit.  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   Hyperlipidemia    Joint pain    Neuromuscular disorder ([place])    Pre-diabetes    Prediabetes    Sleep apnea    uses CPAP nightly   SOB (shortness of breath)    Swallowing difficulty    Swelling of lower extremity    right more than leg leg   Umbilical hernia     No Known Allergies  OBJECTIVE General Patient is awake, alert, and oriented x 3 and in no acute distress. Derm right great toe amputation site has healed.  There is a superficial wound to the dorsal aspect of the right second toe.  Please see above noted photo.  Clinically there is no indication of infection Vasc  DP and PT pedal pulses palpable bilaterally. Temperature gradient within normal limits.  Neuro Epicritic and protective threshold sensation grossly intact bilaterally.  Musculoskeletal Exam partial toe amputation right great toe.  Chronic bilateral lower extremity PVD  ASSESSMENT 1.  Ulcer dorsal aspect of the right second toe 2.   H/o partial toe amputation right great toe 3. H/o lumbar fusion   PLAN OF CARE 1. Patient evaluated today.  2.  Antibiotic Silvadene cream and a light dressing was applied to the right second toe.  Compressive wraps were applied to the right lower extremity all the way up to the level of the knee 3.  Offloading felt pad was applied to the plantar aspect of the left foot as well as his compression hose 4.  Order placed for home health aide to come to the house 1-2x/week for compression dressing changes.  5. RTC weekly until home health is arranged  Edrick Kins, DPM [facility]  Dr. Edrick Kins, DPM    [address], [place] [address]                Office [phone]  Fax [phone]

## 2022-09-24 NOTE — Telephone Encounter (Signed)
-----   Message from Edrick Kins, DPM sent at 09/24/2022 10:58 AM EST ----- Regarding: [facility] to get this patient approved for home health to come to the his home weekly. Order placed.   Thanks, dr. Amalia Hailey

## 2022-09-29 DIAGNOSIS — G5601 Carpal tunnel syndrome, right upper limb: Secondary | ICD-10-CM | POA: Diagnosis not present

## 2022-09-30 ENCOUNTER — Telehealth: Payer: Self-pay | Admitting: *Deleted

## 2022-09-30 NOTE — Telephone Encounter (Signed)
Referral for home health has been sent to [facility], confirmation has been received -09/30/22.

## 2022-09-30 NOTE — Telephone Encounter (Signed)
-----   Message from Edrick Kins, DPM sent at 09/24/2022 10:58 AM EST ----- Regarding: [place] to get this patient approved for home health to come to the his home weekly. Order placed.   Thanks, dr. Amalia Hailey

## 2022-10-01 ENCOUNTER — Other Ambulatory Visit: Payer: Medicare Other

## 2022-10-05 DIAGNOSIS — J069 Acute upper respiratory infection, unspecified: Secondary | ICD-10-CM | POA: Diagnosis not present

## 2022-10-05 DIAGNOSIS — R062 Wheezing: Secondary | ICD-10-CM | POA: Diagnosis not present

## 2022-10-07 DIAGNOSIS — Z6838 Body mass index (BMI) 38.0-38.9, adult: Secondary | ICD-10-CM | POA: Diagnosis not present

## 2022-10-07 DIAGNOSIS — R7989 Other specified abnormal findings of blood chemistry: Secondary | ICD-10-CM | POA: Diagnosis not present

## 2022-10-07 DIAGNOSIS — M6281 Muscle weakness (generalized): Secondary | ICD-10-CM | POA: Diagnosis not present

## 2022-10-07 DIAGNOSIS — Z79899 Other long term (current) drug therapy: Secondary | ICD-10-CM | POA: Diagnosis not present

## 2022-10-07 DIAGNOSIS — J411 Mucopurulent chronic bronchitis: Secondary | ICD-10-CM | POA: Diagnosis not present

## 2022-10-08 ENCOUNTER — Ambulatory Visit (INDEPENDENT_AMBULATORY_CARE_PROVIDER_SITE_OTHER): Payer: Medicare Other | Admitting: Podiatry

## 2022-10-08 DIAGNOSIS — L03115 Cellulitis of right lower limb: Secondary | ICD-10-CM

## 2022-10-08 DIAGNOSIS — I83891 Varicose veins of right lower extremities with other complications: Secondary | ICD-10-CM | POA: Diagnosis not present

## 2022-10-08 DIAGNOSIS — R600 Localized edema: Secondary | ICD-10-CM | POA: Diagnosis not present

## 2022-10-08 NOTE — Progress Notes (Signed)
No chief complaint on file.   SUBJECTIVE Patient presents today for follow-up evaluation of a wound to the right second toe and amputation site to the right great toe.  Patient states that he has noticed over the last 3-4 days increased redness with swelling to the right lower extremity.  He does have a history of chronic right lower extremity edema.  Denies fever chills nausea malaise or any systemic signs of infections however.  Presenting for follow-up treatment evaluation  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   Hyperlipidemia    Joint pain    Neuromuscular disorder ([place])    Pre-diabetes    Prediabetes    Sleep apnea    uses CPAP nightly   SOB (shortness of breath)    Swallowing difficulty    Swelling of lower extremity    right more than leg leg   Umbilical hernia     No Known Allergies       OBJECTIVE General Patient is awake, alert, and oriented x 3 and in no acute distress. Derm right great toe amputation site has healed.  There is a superficial wound to the dorsal aspect of the right second toe.  Please see above noted photo.  Wound appears stable.  No drainage.   Vasc  DP and PT pedal pulses palpable bilaterally.  Erythema with edema noted right lower extremity compared to the contralateral limb.  Right leg is warm to touch compared to the contralateral limb. VAS [place] ABI W/WO TBI 10/02/2021   ABI Findings:   +---------+------------------+-----+---------+--------+  Right   Rt Pressure (mmHg)IndexWaveform Comment   +---------+------------------+-----+---------+--------+  Brachial 152                                       +---------+------------------+-----+---------+--------+  PTA     167               1.10 triphasic          +---------+------------------+-----+---------+--------+  DP      173               1.14 triphasic          +---------+------------------+-----+---------+--------+  Great Toe137               0.90                    +---------+------------------+-----+---------+--------+   +---------+------------------+-----+---------+-------+  Left    Lt Pressure (mmHg)IndexWaveform Comment  +---------+------------------+-----+---------+-------+  Brachial 146                                      +---------+------------------+-----+---------+-------+  PTA     172               1.13 triphasic         +---------+------------------+-----+---------+-------+  DP      177               1.16 triphasic         +---------+------------------+-----+---------+-------+  Great Toe122               0.80                   +---------+------------------+-----+---------+-------+   +-------+-----------+-----------+------------+------------+  ABI/TBIToday's ABIToday's TBIPrevious ABIPrevious TBI  +-------+-----------+-----------+------------+------------+  Right 1.14       0.90       1.25        1.25          +-------+-----------+-----------+------------+------------+  Left  1.16       0.80       1.22        0.89          +-------+-----------+-----------+------------+------------+  Neuro Epicritic and protective threshold sensation grossly intact bilaterally.  Musculoskeletal Exam partial toe amputation right great toe.  Chronic bilateral lower extremity PVD.   ASSESSMENT 1.  Ulcer dorsal aspect of the right second toe 2.   H/o partial toe amputation right great toe 3. H/o lumbar fusion  4.  Cellulitis right leg  PLAN OF CARE 1. Patient evaluated today.  2.  Due to the acute erythema with edema to the right lower extremity over the last 3-4 days I did recommend that the patient goes to the emergency department for evaluation and workup.  Concern for acute worsening infection of the right lower extremity.  Patient agrees 3.  While inpatient, it would be good to evaluate the right second toe with follow-up x-rays and possible MRI as well as updated ABIs.  Patient states that he is closely monitored with vascular and has had multiple vascular procedures performed right lower extremity 4.  If there are concerns for osteomyelitis to the right second toe recommend podiatry consultation while inpatient and amputation of the digit.  Edrick Kins, DPM [facility]  Dr. Edrick Kins, DPM    [address], [place] [address]                Office [phone]  Fax [phone]

## 2022-10-09 DIAGNOSIS — R600 Localized edema: Secondary | ICD-10-CM | POA: Diagnosis not present

## 2022-10-09 DIAGNOSIS — L03115 Cellulitis of right lower limb: Secondary | ICD-10-CM | POA: Diagnosis not present

## 2022-10-14 ENCOUNTER — Telehealth: Payer: Self-pay | Admitting: Cardiovascular Disease

## 2022-10-14 NOTE — Telephone Encounter (Signed)
Patient saw vein and vascular this past Friday and will see pulmonology tomorrow for SOB. Could hear some wheezing over phone. Patient wanted to be seen in cardiology clinic for blood pressure and his leg edema. Last BP 179/91. Patient doesn't have BP cuff at home. Made appointment with Dr. Gwenlyn Found on 12/15.

## 2022-10-14 NOTE — Telephone Encounter (Signed)
Pt c/o swelling: STAT is pt has developed SOB within 24 hours  If swelling, where is the swelling located? Right leg   How much weight have you gained and in what time span? Not sure   Have you gained 3 pounds in a day or 5 pounds in a week? Not sure   Do you have a log of your daily weights (if so, list)? No   Are you currently taking a fluid pill? Yes   Are you currently SOB? Yes, started about several weeks ago   Have you traveled recently? No   Pt states he has been swelling with some SOB. He states he went to see his other doctor and his bp 179/91

## 2022-10-15 ENCOUNTER — Encounter: Payer: Self-pay | Admitting: Pulmonary Disease

## 2022-10-15 ENCOUNTER — Other Ambulatory Visit: Payer: Medicare Other

## 2022-10-15 ENCOUNTER — Ambulatory Visit (INDEPENDENT_AMBULATORY_CARE_PROVIDER_SITE_OTHER): Payer: Medicare Other | Admitting: Pulmonary Disease

## 2022-10-15 ENCOUNTER — Ambulatory Visit (INDEPENDENT_AMBULATORY_CARE_PROVIDER_SITE_OTHER): Payer: Medicare Other

## 2022-10-15 VITALS — BP 144/82 | HR 99 | Temp 97.8°F | Ht 69.0 in | Wt 256.2 lb

## 2022-10-15 DIAGNOSIS — J4530 Mild persistent asthma, uncomplicated: Secondary | ICD-10-CM

## 2022-10-15 DIAGNOSIS — R0602 Shortness of breath: Secondary | ICD-10-CM | POA: Diagnosis not present

## 2022-10-15 MED ORDER — AZITHROMYCIN 250 MG PO TABS: ORAL_TABLET | ORAL | 0 refills | Status: DC

## 2022-10-15 NOTE — Telephone Encounter (Signed)
Spoke with patient who wants to see Dr. Gwenlyn Found, not APP. Spoke with Dr. Kennon Holter nurse who said for patient to keep appointment with Dr. Gwenlyn Found on Friday. Patient advised and was happy to keep appointment as planned.

## 2022-10-15 NOTE — Progress Notes (Unsigned)
[name]    [id number]    1941-08-17  Primary Care Physician:McNeill, Abigail Butts, MD  Referring Physician: Cari Caraway, MD [name],  [name] [id number]   Chief complaint: Follow up for dyspnea  HPI: 81 y.o.  with history of sleep apnea, allergic rhinitis, hyperlipidemia Referred for evaluation of dyspnea.  Symptoms have been going on for the past 2 years.  He has dyspnea on exertion and sometimes at rest.  Associated with wheeze, chest tightness.  Currently on albuterol inhaler.  Previously had tried Advair but cannot tell if it made a difference.  He stopped using it as it was too expensive.  Has seasonal allergies, significant issues with GERD and is on Nexium.  History also noted for colonic polyps.  Previously followed by Dr. Benson Norway Reports seeing blood in the stool recently and intermittent right upper quadrant pain.  Follows with Dr. Gwenlyn Found, cardiology with normal cardiac catheterization in 2008.  Echocardiogram and stress test in 2018 within normal limits.  Symptoms not felt to be related to the heart.  Has bilateral lower extremity edema which is attributed to venous insufficiency.  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  Reports significant basement dampness and flooding at home with mold issues.  No hot tub, Jacuzzi, down pillows, comforters Smoking history: Never smoker Travel history: No significant travel history Relevant family history: No significant family history of lung disease  Interim history: Continues on Symbicort.  States that over the past few weeks he has increasing sinus congestion, postnasal drip and cough with yellow/green mucus with worsening dyspnea, wheezing.  Outpatient  Encounter Medications as of 10/15/2022  Medication Sig   acetaminophen (TYLENOL) 325 MG tablet Take 1-2 tablets (325-650 mg total) by mouth every 6 (six) hours as needed for mild pain (pain score 1-3 or temp > 100.5).   albuterol (VENTOLIN HFA) 108 (90 Base) MCG/ACT inhaler Inhale 2 puffs into the lungs every 6 (six) hours as needed for wheezing or shortness of breath.   aspirin EC 325 MG tablet Take 1 tablet (325 mg total) by mouth 2 (two) times daily after a meal. Take until one month post op for DVT prophlaxis   bisacodyl (DULCOLAX) 5 MG EC tablet Take 1 tablet (5 mg total) by mouth daily as needed for moderate constipation.   budesonide-formoterol (SYMBICORT) 160-4.5 MCG/ACT inhaler Inhale 2 puffs into the lungs 2 (two) times daily.   buPROPion (WELLBUTRIN XL) 150 MG 24 hr tablet Take 450 mg by mouth every morning.   CALCIUM PO Take 1 tablet by mouth every morning.   docusate sodium (COLACE) 100 MG capsule Take 1 capsule (100 mg total) by mouth 2 (two) times daily.   doxazosin (CARDURA) 8 MG tablet TAKE 1 TABLET BY MOUTH DAILY  . Take at night (Patient taking differently: Take 8 mg by mouth at bedtime.)   esomeprazole (NEXIUM) 20 MG capsule Take 20 mg by mouth every morning.   furosemide (LASIX) 40 MG tablet Take 40 mg by mouth daily.   gabapentin (NEURONTIN) 300 MG capsule TAKE 1 TO 2 CAPSULES BY MOUTH AT BEDTIME (Patient taking differently: Take 600 mg by mouth at bedtime.)   HYDROcodone-acetaminophen (NORCO) 5-325 MG tablet Take 1-2 tablets by  mouth every 4 (four) hours as needed for moderate pain (Use sparingly).   methocarbamol (ROBAXIN) 500 MG tablet Take 1 tablet (500 mg total) by mouth every 8 (eight) hours as needed for muscle spasms. (Patient taking differently: Take 500 mg by mouth in the morning and at bedtime.)   mirtazapine (REMERON) 15 MG tablet Take 15 mg by mouth at bedtime.   montelukast (SINGULAIR) 10 MG tablet Take 1 tablet (10 mg total) by mouth at bedtime.   Multiple  Vitamin (MULTIVITAMIN WITH MINERALS) TABS tablet Take 1 tablet by mouth every morning.   polyethylene glycol (MIRALAX / GLYCOLAX) 17 g packet Take 17 g by mouth daily as needed for mild constipation.   potassium chloride SA (K-DUR,KLOR-CON) 20 MEQ tablet Take 1 tablet (20 mEq total) by mouth 2 (two) times daily.   predniSONE (DELTASONE) 20 MG tablet Take '40mg'$  for 5 days   rosuvastatin (CRESTOR) 5 MG tablet Take 5 mg by mouth every morning.   No facility-administered encounter medications on file as of 10/15/2022.    Allergies as of 10/15/2022   (No Known Allergies)   Physical Exam: Blood pressure (!) 140/78, pulse 90, temperature 98.3 F (36.8 C), temperature source Oral, height '5\' 8"'$  (1.727 m), weight 252 lb 9.6 oz (114.6 kg), SpO2 98 %. Gen:      No acute distress HEENT:  EOMI, sclera anicteric Neck:     No masses; no thyromegaly Lungs:    Scattered expiratory wheeze CV:         Regular rate and rhythm; no murmurs Abd:      + bowel sounds; soft, non-tender; no palpable masses, no distension Ext:    No edema; adequate peripheral perfusion Skin:      Warm and dry; no rash Neuro: alert and oriented x 3 Psych: normal mood and affect   Data Reviewed: Imaging: CTA 08/13/2018 no pulmonary embolism.  Bronchial wall thickening.  Dependent atelectasis.  CT chest 08/16/2019-mild dependent atelectasis, No evidence of interstitial lung disease. I have reviewed the images personally.  CTA 10/18/2019-no pulmonary embolism or acute disease  Chest x-ray 04/05/2021-chronic interstitial and bronchitic changes  CTA 12/02/2021-no pulmonary embolism, no acute abnormalities of the lungs.  Chest x-ray 05/07/2022-no evidence of acute cardiopulmonary abnormality. I have reviewed the images personally.  PFTs  11/25/2019 FVC 3.21 [82%], FEV1 2.61 [93%], F/F 81, TLC 5.93 [87%], DLCO 21.42 [90%] Normal test.  Cardiac: Echocardiogram 02/18/2017- LVEF 40-98%, grade 1 diastolic dysfunction  Nuclear stress  test 02/19/2017-mild diffuse hypokinesis, EF 45%.  Low risk study with no ischemia identified.   Labs: CMP 08/05/2019-significant for creatinine of 1.64, hepatic panel within normal limits CBC FasTRACT on 7/22-WBC 6.1, eos 3%, absolute eosinophil count 183 IgE 08/05/2019-89 Hypersensitivity panel 08/05/2019-negative  BNP 04/05/2021-24  SARS-CoV-2 09/06/2019-negative  Sleep PSG 12/01/2018- severe sleep apnea with AHI 90.6 desaturations to 76%. CPAP titration/2/20- recommend trial CPAP on 11  Assessment:  Mild asthma Suspect reactive airway disease, asthma, with contribution from deconditioning, body habitus.  He has significant mold exposure at home but no evidence of hypersensitivity pneumonitis on labs or CT scan  Appears to have a mild exacerbation secondary to rhinosinusitis.  We will get a chest x-ray today Treat with Z-Pak and prednisone 40 mg a day for 5 days Continue Symbicort He will benefit from weight loss with diet and exercise but his mobility is limited with back issues, spinal stenosis and orthopedic issues  GERD Continue Nexium  Severe Obstructive sleep apnea Compliant with CPAP  Plan/Recommendations: -  Chest x-ray -  - Symbicort twice daily  Marshell Garfinkel MD [facility] 10/15/2022, 1:41 PM  CC: Cari Caraway, MD

## 2022-10-15 NOTE — Patient Instructions (Signed)
Change Symbicort to Breztri inhaler Follow-up with patient assistance to see if you can get assistance for inhalers Prescribed Z-Pak, get chest x-ray Follow-up in 3 months

## 2022-10-16 ENCOUNTER — Encounter: Payer: Self-pay | Admitting: Pulmonary Disease

## 2022-10-16 DIAGNOSIS — R600 Localized edema: Secondary | ICD-10-CM | POA: Diagnosis not present

## 2022-10-16 DIAGNOSIS — L98492 Non-pressure chronic ulcer of skin of other sites with fat layer exposed: Secondary | ICD-10-CM | POA: Diagnosis not present

## 2022-10-16 DIAGNOSIS — Z6839 Body mass index (BMI) 39.0-39.9, adult: Secondary | ICD-10-CM | POA: Diagnosis not present

## 2022-10-16 DIAGNOSIS — R209 Unspecified disturbances of skin sensation: Secondary | ICD-10-CM | POA: Diagnosis not present

## 2022-10-16 DIAGNOSIS — R262 Difficulty in walking, not elsewhere classified: Secondary | ICD-10-CM | POA: Diagnosis not present

## 2022-10-16 DIAGNOSIS — J411 Mucopurulent chronic bronchitis: Secondary | ICD-10-CM | POA: Diagnosis not present

## 2022-10-16 DIAGNOSIS — S98111A Complete traumatic amputation of right great toe, initial encounter: Secondary | ICD-10-CM | POA: Diagnosis not present

## 2022-10-16 DIAGNOSIS — R350 Frequency of micturition: Secondary | ICD-10-CM | POA: Diagnosis not present

## 2022-10-16 DIAGNOSIS — M79606 Pain in leg, unspecified: Secondary | ICD-10-CM | POA: Diagnosis not present

## 2022-10-17 ENCOUNTER — Other Ambulatory Visit (HOSPITAL_COMMUNITY): Payer: Self-pay | Admitting: Family Medicine

## 2022-10-17 ENCOUNTER — Ambulatory Visit (HOSPITAL_COMMUNITY)
Admission: RE | Admit: 2022-10-17 | Discharge: 2022-10-17 | Disposition: A | Discharge status: Home / Self Care | Payer: Medicare Other | Source: Ambulatory Visit | Attending: Vascular Surgery | Admitting: Vascular Surgery

## 2022-10-17 DIAGNOSIS — I739 Peripheral vascular disease, unspecified: Secondary | ICD-10-CM

## 2022-10-20 ENCOUNTER — Telehealth: Payer: Self-pay | Admitting: *Deleted

## 2022-10-20 NOTE — Telephone Encounter (Signed)
Called and left message for patient to call back.  Breztri patient assistance application started.  Patient annual income and signature needed to fax application to [employer].

## 2022-10-21 NOTE — Telephone Encounter (Signed)
ATC patient about signing patient assistance form.  LM for patient to call back to let me know if he would like me to mail form or if he would like to come by and sign it.

## 2022-10-22 ENCOUNTER — Other Ambulatory Visit: Payer: Medicare Other

## 2022-10-22 DIAGNOSIS — R3915 Urgency of urination: Secondary | ICD-10-CM | POA: Diagnosis not present

## 2022-10-22 NOTE — Telephone Encounter (Signed)
Patient came by office to sign [employer] patient assistance application.  Application signed and completed.  Application faxed to [employer] [phone]. Confirmation received.

## 2022-10-23 ENCOUNTER — Ambulatory Visit: Payer: Medicare Other | Admitting: Orthopedic Surgery

## 2022-10-23 ENCOUNTER — Ambulatory Visit: Payer: Medicare Other | Admitting: Nurse Practitioner

## 2022-10-24 ENCOUNTER — Ambulatory Visit: Payer: Medicare Other | Admitting: Cardiovascular Disease

## 2022-10-30 DIAGNOSIS — Z961 Presence of intraocular lens: Secondary | ICD-10-CM | POA: Diagnosis not present

## 2022-10-30 DIAGNOSIS — H02834 Dermatochalasis of left upper eyelid: Secondary | ICD-10-CM | POA: Diagnosis not present

## 2022-10-30 DIAGNOSIS — H35342 Macular cyst, hole, or pseudohole, left eye: Secondary | ICD-10-CM | POA: Diagnosis not present

## 2022-10-30 DIAGNOSIS — H0288A Meibomian gland dysfunction right eye, upper and lower eyelids: Secondary | ICD-10-CM | POA: Diagnosis not present

## 2022-10-30 DIAGNOSIS — H35373 Puckering of macula, bilateral: Secondary | ICD-10-CM | POA: Diagnosis not present

## 2022-10-30 DIAGNOSIS — S71102A Unspecified open wound, left thigh, initial encounter: Secondary | ICD-10-CM | POA: Diagnosis not present

## 2022-10-30 DIAGNOSIS — W19XXXA Unspecified fall, initial encounter: Secondary | ICD-10-CM | POA: Diagnosis not present

## 2022-10-30 DIAGNOSIS — H02831 Dermatochalasis of right upper eyelid: Secondary | ICD-10-CM | POA: Diagnosis not present

## 2022-10-30 DIAGNOSIS — T148XXA Other injury of unspecified body region, initial encounter: Secondary | ICD-10-CM | POA: Diagnosis not present

## 2022-10-30 DIAGNOSIS — H04123 Dry eye syndrome of bilateral lacrimal glands: Secondary | ICD-10-CM | POA: Diagnosis not present

## 2022-10-30 DIAGNOSIS — H0288B Meibomian gland dysfunction left eye, upper and lower eyelids: Secondary | ICD-10-CM | POA: Diagnosis not present

## 2022-11-01 ENCOUNTER — Encounter (HOSPITAL_COMMUNITY): Payer: Self-pay

## 2022-11-01 ENCOUNTER — Emergency Department (HOSPITAL_COMMUNITY)
Admission: EM | Admit: 2022-11-01 | Discharge: 2022-11-02 | Discharge status: Against Medical Advice | Payer: Medicare Other | Attending: Student | Admitting: Student

## 2022-11-01 ENCOUNTER — Other Ambulatory Visit: Payer: Self-pay

## 2022-11-01 DIAGNOSIS — W1839XD Other fall on same level, subsequent encounter: Secondary | ICD-10-CM | POA: Diagnosis not present

## 2022-11-01 DIAGNOSIS — S81802A Unspecified open wound, left lower leg, initial encounter: Secondary | ICD-10-CM | POA: Diagnosis not present

## 2022-11-01 DIAGNOSIS — Z4800 Encounter for change or removal of nonsurgical wound dressing: Secondary | ICD-10-CM | POA: Insufficient documentation

## 2022-11-01 DIAGNOSIS — Z5321 Procedure and treatment not carried out due to patient leaving prior to being seen by health care provider: Secondary | ICD-10-CM | POA: Diagnosis not present

## 2022-11-01 DIAGNOSIS — S81802D Unspecified open wound, left lower leg, subsequent encounter: Secondary | ICD-10-CM | POA: Diagnosis not present

## 2022-11-01 LAB — CBC WITH DIFFERENTIAL/PLATELET
Abs Immature Granulocytes: 0.02 10*3/uL (ref 0.00–0.07)
Basophils Absolute: 0 10*3/uL (ref 0.0–0.1)
Basophils Relative: 1 %
Eosinophils Absolute: 0.2 10*3/uL (ref 0.0–0.5)
Eosinophils Relative: 3 %
HCT: 36.5 % — ABNORMAL LOW (ref 39.0–52.0)
Hemoglobin: 12.1 g/dL — ABNORMAL LOW (ref 13.0–17.0)
Immature Granulocytes: 0 %
Lymphocytes Relative: 14 %
Lymphs Abs: 0.9 10*3/uL (ref 0.7–4.0)
MCH: 32.5 pg (ref 26.0–34.0)
MCHC: 33.2 g/dL (ref 30.0–36.0)
MCV: 98.1 fL (ref 80.0–100.0)
Monocytes Absolute: 0.5 10*3/uL (ref 0.1–1.0)
Monocytes Relative: 8 %
Neutro Abs: 4.7 10*3/uL (ref 1.7–7.7)
Neutrophils Relative %: 74 %
Platelets: 165 10*3/uL (ref 150–400)
RBC: 3.72 MIL/uL — ABNORMAL LOW (ref 4.22–5.81)
RDW: 14.7 % (ref 11.5–15.5)
WBC: 6.4 10*3/uL (ref 4.0–10.5)
nRBC: 0 % (ref 0.0–0.2)

## 2022-11-01 LAB — BASIC METABOLIC PANEL
Anion gap: 9 (ref 5–15)
BUN: 28 mg/dL — ABNORMAL HIGH (ref 8–23)
CO2: 26 mmol/L (ref 22–32)
Calcium: 9.2 mg/dL (ref 8.9–10.3)
Chloride: 107 mmol/L (ref 98–111)
Creatinine, Ser: 2.08 mg/dL — ABNORMAL HIGH (ref 0.61–1.24)
GFR, Estimated: 31 mL/min — ABNORMAL LOW (ref >60)
Glucose, Bld: 122 mg/dL — ABNORMAL HIGH (ref 70–99)
Potassium: 3.6 mmol/L (ref 3.5–5.1)
Sodium: 142 mmol/L (ref 135–145)

## 2022-11-01 LAB — LACTIC ACID, PLASMA: Lactic Acid, Venous: 1.7 mmol/L (ref 0.5–1.9)

## 2022-11-01 NOTE — ED Triage Notes (Signed)
Left lower extremity wound and pain after a fall and scraping his skin off.   Began weeping and concerned for infection.

## 2022-11-01 NOTE — ED Provider Triage Note (Signed)
Emergency Medicine Provider Triage Evaluation Note  ARMOND CUTHRELL , a 81 y.o. male  was evaluated in triage.  Pt complains of wound to the left leg.  Reports that around a week ago he fell onto concrete and scraped his left leg.  Today he went to the store and when he took his pants down the dressing "ripped his skin off."  No history of diabetes.  Review of Systems  Positive:  Negative:   Physical Exam  There were no vitals taken for this visit. Gen:   Awake, no distress   Resp:  Normal effort  MSK:   Moves extremities without difficulty Other:          Medical Decision Making  Medically screening exam initiated at 7:44 PM.  Appropriate orders placed.  [name] was informed that the remainder of the evaluation will be completed by another provider, this initial triage assessment does not replace that evaluation, and the importance of remaining in the ED until their evaluation is complete.     Rhae Hammock, [place] 11/01/22 1945

## 2022-11-02 NOTE — ED Notes (Signed)
Called 3x for room placement. Eloped from waiting area.

## 2022-11-05 DIAGNOSIS — Z6841 Body Mass Index (BMI) 40.0 and over, adult: Secondary | ICD-10-CM | POA: Diagnosis not present

## 2022-11-05 DIAGNOSIS — S70312A Abrasion, left thigh, initial encounter: Secondary | ICD-10-CM | POA: Diagnosis not present

## 2022-11-06 ENCOUNTER — Telehealth: Payer: Self-pay | Admitting: Cardiovascular Disease

## 2022-11-06 ENCOUNTER — Ambulatory Visit (INDEPENDENT_AMBULATORY_CARE_PROVIDER_SITE_OTHER): Payer: Medicare Other | Admitting: Podiatry

## 2022-11-06 ENCOUNTER — Ambulatory Visit (INDEPENDENT_AMBULATORY_CARE_PROVIDER_SITE_OTHER): Payer: Medicare Other

## 2022-11-06 ENCOUNTER — Other Ambulatory Visit: Payer: Medicare Other

## 2022-11-06 DIAGNOSIS — L97512 Non-pressure chronic ulcer of other part of right foot with fat layer exposed: Secondary | ICD-10-CM | POA: Diagnosis not present

## 2022-11-06 DIAGNOSIS — L03115 Cellulitis of right lower limb: Secondary | ICD-10-CM

## 2022-11-06 DIAGNOSIS — R350 Frequency of micturition: Secondary | ICD-10-CM | POA: Diagnosis not present

## 2022-11-06 DIAGNOSIS — S91109A Unspecified open wound of unspecified toe(s) without damage to nail, initial encounter: Secondary | ICD-10-CM

## 2022-11-06 DIAGNOSIS — R7303 Prediabetes: Secondary | ICD-10-CM

## 2022-11-06 DIAGNOSIS — N401 Enlarged prostate with lower urinary tract symptoms: Secondary | ICD-10-CM | POA: Diagnosis not present

## 2022-11-06 DIAGNOSIS — I872 Venous insufficiency (chronic) (peripheral): Secondary | ICD-10-CM

## 2022-11-06 DIAGNOSIS — R3915 Urgency of urination: Secondary | ICD-10-CM | POA: Diagnosis not present

## 2022-11-06 MED ORDER — CEPHALEXIN 500 MG PO CAPS: 500.0000 mg | ORAL_CAPSULE | Freq: Three times a day (TID) | ORAL | 0 refills | Status: DC

## 2022-11-06 NOTE — Telephone Encounter (Signed)
Returned call to pt he states that he went to the podiatrist and was told that he needs to follow up tomorrow with cardiology. Found appt for Monday and pt said that he was unable to come to that appointment he has a previous appointment that he has to attend. Tried to give him an appt tues once not available for that appt either. Finally scheduled appointment with Raquel Sarna. He will arrive at 2pm.

## 2022-11-06 NOTE — Telephone Encounter (Signed)
LMTCB

## 2022-11-06 NOTE — Progress Notes (Signed)
Subjective:  Patient ID: [name], male    DOB: 1941-05-14,  MRN: [id number]  Chief Complaint  Patient presents with   Follow-up    right great toe amputated follow up on infection    81 y.o. male presents for redness and swelling in the bilateral lower extremity.  He says the right leg is worse than the left.  This has been a problem for him chronically.  He is seeing a vascular doctor and has an appointment with them next week.  At last visit he was seen by Dr. Amalia Hailey on October 08, 2022.  He was told to go to the emergency department for further workup and desiccation of both cellulitis of the right leg as well as wound to the right second toe.  He ultimately decided not to do this against those recommendations.  He has not followed up since then.  Unclear who has been doing any type of dressing care for him he says he is not able to do any dressings or wound care himself due to mobility issues.  He said that he was previously seeing Dr. Blenda Mounts once a week for wound care.  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  Also edema dry skin noted to the bilateral lower leg right worse than left similar to prior pictures.  Overall very poor foot hygiene with what appears to be feces present in between the toes of both feet as well as dry flaking skin.    Vascular:  Dorsalis Pedis artery and Posterior Tibial artery pedal pulses are 2/4 bilateral.  Capillary fill time < 3 sec to all digits.   Neruologic: Grossly intact via light touch bilateral. Protective threshold intact to all sites bilateral.   Musculoskeletal: Prior amputation of the distal aspect of the right hallux.  Edema of the bilateral lower extremity.  Gait: Unassisted, Nonantalgic.    Radiographs:  Date: 11/06/2022 XR the right foot Weightbearing AP/Lateral/Oblique   Findings: no soft tissue emphysema, no signs of osteomyelitis including no osteolysis or erosions that  are obvious about the phalanges of second toe of the right foot. Assessment:   1. Ulcer of right foot with fat layer exposed ([place])   2. Venous (peripheral) insufficiency   3. Cellulitis of right leg   4. Open toe wound, initial encounter   5. Prediabetes      Plan:  Patient was evaluated and treated and all questions answered.  #Ulcer dorsal aspect of the right second toe at the distal interphalangeal joint to the level of subcutaneous fat tissue -We discussed the etiology and factors that are a part of the wound healing process.  We also discussed the risk of infection both soft tissue and osteomyelitis from open ulceration.  Discussed the risk of limb loss if this happens or worsens. -Debridement as below. -Dressed with Betadine, DSD. -Continue home dressing changes daily with Betadine and  Band-Aid -Continue off-loading with surgical shoe. -Vascular testing patient is currently seeing vein and vascular specialist has an appointment next week -Last antibiotics: Begin course of cephalexin 500 mg 3 times daily x 10 days for bilateral lower extremity cellulitis -Imaging: x-ray reviewed, shows no signs of erosions, osteolysis, osteomyelitis or emphysema. -Discussed that I will again place a referral to wound care center for the patient.  Will ask them to get him in ahead of his currently scheduled appointment on December 03, 2021   Procedure: Excisional Debridement of Wound Rationale: Removal of non-viable soft tissue from the wound to promote healing.  Anesthesia: none Post-Debridement Wound Measurements: 1 cm x 0.4 cm x 0.3 cm  Type of Debridement: Sharp Excisional Tissue Removed: Non-viable soft tissue Depth of Debridement: subcutaneous tissue. Technique: Sharp excisional debridement to bleeding, viable wound base.  Dressing: Dry, sterile, compression dressing. Disposition: Patient tolerated procedure well.    # Bilateral lower extremity chronic edema and associated cellulitis -Discussed again with patient that I would recommend he go to the emergency department for further workup of the cellulitis and bilateral lower extremity -Patient states he has tried this in the past that has not been helpful and he had to wait too long so he left. -As he does not want to go to Emergency Department I will send in a course of 500 mg cephalexin 3 times daily for 10 days.   -Continue with compression stockings -Again discussed importance of proceeding to the emergency department if he is develops any nausea vomiting fever chills.  Return in about 2 weeks (around 11/20/2022) for Right 2nd toe wound, with Dr. Blenda Mounts.          Everitt Amber, DPM [facility]

## 2022-11-06 NOTE — Telephone Encounter (Signed)
Pt stated he was returning a call. Informed him I wasn't sure who called. He wanted to speak to someone about his swelling pt was seen at the doctor today and he was told to f/u sooner

## 2022-11-06 NOTE — Telephone Encounter (Signed)
Pt is returning call. Requesting return call.  

## 2022-11-12 ENCOUNTER — Other Ambulatory Visit: Payer: Self-pay

## 2022-11-12 ENCOUNTER — Encounter (HOSPITAL_COMMUNITY): Payer: Self-pay

## 2022-11-12 ENCOUNTER — Encounter: Payer: Self-pay | Admitting: Emergency Medicine

## 2022-11-12 ENCOUNTER — Telehealth: Payer: Self-pay | Admitting: Student

## 2022-11-12 ENCOUNTER — Ambulatory Visit (INDEPENDENT_AMBULATORY_CARE_PROVIDER_SITE_OTHER): Payer: Medicare Other

## 2022-11-12 ENCOUNTER — Ambulatory Visit: Payer: Medicare Other | Attending: Nurse Practitioner | Admitting: Nurse Practitioner

## 2022-11-12 ENCOUNTER — Inpatient Hospital Stay (HOSPITAL_COMMUNITY)
Admission: EM | Admit: 2022-11-12 | Discharge: 2022-11-23 | DRG: 193 | Disposition: A | Discharge status: Home Health | Payer: Medicare Other | Attending: Family Medicine | Admitting: Family Medicine

## 2022-11-12 ENCOUNTER — Emergency Department (HOSPITAL_COMMUNITY): Payer: Medicare Other

## 2022-11-12 ENCOUNTER — Ambulatory Visit
Admission: EM | Admit: 2022-11-12 | Discharge: 2022-11-12 | Disposition: A | Discharge status: Home / Self Care | Payer: Medicare Other | Attending: Internal Medicine | Admitting: Internal Medicine

## 2022-11-12 DIAGNOSIS — Z7951 Long term (current) use of inhaled steroids: Secondary | ICD-10-CM

## 2022-11-12 DIAGNOSIS — R918 Other nonspecific abnormal finding of lung field: Secondary | ICD-10-CM | POA: Diagnosis not present

## 2022-11-12 DIAGNOSIS — Z8249 Family history of ischemic heart disease and other diseases of the circulatory system: Secondary | ICD-10-CM

## 2022-11-12 DIAGNOSIS — R911 Solitary pulmonary nodule: Secondary | ICD-10-CM | POA: Insufficient documentation

## 2022-11-12 DIAGNOSIS — R062 Wheezing: Secondary | ICD-10-CM | POA: Diagnosis not present

## 2022-11-12 DIAGNOSIS — N1832 Chronic kidney disease, stage 3b: Secondary | ICD-10-CM | POA: Diagnosis present

## 2022-11-12 DIAGNOSIS — J9601 Acute respiratory failure with hypoxia: Principal | ICD-10-CM | POA: Diagnosis present

## 2022-11-12 DIAGNOSIS — Z89022 Acquired absence of left finger(s): Secondary | ICD-10-CM

## 2022-11-12 DIAGNOSIS — K219 Gastro-esophageal reflux disease without esophagitis: Secondary | ICD-10-CM | POA: Diagnosis present

## 2022-11-12 DIAGNOSIS — G4733 Obstructive sleep apnea (adult) (pediatric): Secondary | ICD-10-CM | POA: Insufficient documentation

## 2022-11-12 DIAGNOSIS — Z96642 Presence of left artificial hip joint: Secondary | ICD-10-CM | POA: Diagnosis present

## 2022-11-12 DIAGNOSIS — R0602 Shortness of breath: Secondary | ICD-10-CM

## 2022-11-12 DIAGNOSIS — E669 Obesity, unspecified: Secondary | ICD-10-CM | POA: Diagnosis present

## 2022-11-12 DIAGNOSIS — E785 Hyperlipidemia, unspecified: Secondary | ICD-10-CM | POA: Diagnosis present

## 2022-11-12 DIAGNOSIS — K921 Melena: Secondary | ICD-10-CM | POA: Diagnosis present

## 2022-11-12 DIAGNOSIS — J09X1 Influenza due to identified novel influenza A virus with pneumonia: Secondary | ICD-10-CM | POA: Diagnosis present

## 2022-11-12 DIAGNOSIS — Z6837 Body mass index (BMI) 37.0-37.9, adult: Secondary | ICD-10-CM

## 2022-11-12 DIAGNOSIS — J441 Chronic obstructive pulmonary disease with (acute) exacerbation: Secondary | ICD-10-CM | POA: Diagnosis present

## 2022-11-12 DIAGNOSIS — R059 Cough, unspecified: Secondary | ICD-10-CM | POA: Diagnosis not present

## 2022-11-12 DIAGNOSIS — J9621 Acute and chronic respiratory failure with hypoxia: Secondary | ICD-10-CM | POA: Diagnosis not present

## 2022-11-12 DIAGNOSIS — K649 Unspecified hemorrhoids: Secondary | ICD-10-CM | POA: Diagnosis present

## 2022-11-12 DIAGNOSIS — B338 Other specified viral diseases: Secondary | ICD-10-CM

## 2022-11-12 DIAGNOSIS — I959 Hypotension, unspecified: Secondary | ICD-10-CM | POA: Diagnosis not present

## 2022-11-12 DIAGNOSIS — J4530 Mild persistent asthma, uncomplicated: Secondary | ICD-10-CM | POA: Diagnosis present

## 2022-11-12 DIAGNOSIS — Z89411 Acquired absence of right great toe: Secondary | ICD-10-CM

## 2022-11-12 DIAGNOSIS — N400 Enlarged prostate without lower urinary tract symptoms: Secondary | ICD-10-CM | POA: Diagnosis present

## 2022-11-12 DIAGNOSIS — Z1152 Encounter for screening for COVID-19: Secondary | ICD-10-CM | POA: Diagnosis not present

## 2022-11-12 DIAGNOSIS — J1000 Influenza due to other identified influenza virus with unspecified type of pneumonia: Secondary | ICD-10-CM | POA: Diagnosis present

## 2022-11-12 DIAGNOSIS — Z833 Family history of diabetes mellitus: Secondary | ICD-10-CM

## 2022-11-12 DIAGNOSIS — J440 Chronic obstructive pulmonary disease with acute lower respiratory infection: Secondary | ICD-10-CM | POA: Diagnosis present

## 2022-11-12 DIAGNOSIS — J101 Influenza due to other identified influenza virus with other respiratory manifestations: Secondary | ICD-10-CM | POA: Diagnosis not present

## 2022-11-12 DIAGNOSIS — R7303 Prediabetes: Secondary | ICD-10-CM | POA: Diagnosis present

## 2022-11-12 DIAGNOSIS — R0902 Hypoxemia: Secondary | ICD-10-CM | POA: Diagnosis not present

## 2022-11-12 DIAGNOSIS — F32A Depression, unspecified: Secondary | ICD-10-CM | POA: Diagnosis present

## 2022-11-12 DIAGNOSIS — J4531 Mild persistent asthma with (acute) exacerbation: Secondary | ICD-10-CM | POA: Diagnosis not present

## 2022-11-12 DIAGNOSIS — B974 Respiratory syncytial virus as the cause of diseases classified elsewhere: Secondary | ICD-10-CM | POA: Diagnosis not present

## 2022-11-12 DIAGNOSIS — Z7952 Long term (current) use of systemic steroids: Secondary | ICD-10-CM

## 2022-11-12 DIAGNOSIS — J189 Pneumonia, unspecified organism: Secondary | ICD-10-CM | POA: Diagnosis not present

## 2022-11-12 DIAGNOSIS — J9809 Other diseases of bronchus, not elsewhere classified: Secondary | ICD-10-CM | POA: Diagnosis not present

## 2022-11-12 DIAGNOSIS — Z7982 Long term (current) use of aspirin: Secondary | ICD-10-CM

## 2022-11-12 DIAGNOSIS — Z8349 Family history of other endocrine, nutritional and metabolic diseases: Secondary | ICD-10-CM | POA: Diagnosis not present

## 2022-11-12 DIAGNOSIS — Z96652 Presence of left artificial knee joint: Secondary | ICD-10-CM | POA: Diagnosis present

## 2022-11-12 LAB — CBC
HCT: 37 % — ABNORMAL LOW (ref 39.0–52.0)
Hemoglobin: 12.2 g/dL — ABNORMAL LOW (ref 13.0–17.0)
MCH: 32.1 pg (ref 26.0–34.0)
MCHC: 33 g/dL (ref 30.0–36.0)
MCV: 97.4 fL (ref 80.0–100.0)
Platelets: 216 10*3/uL (ref 150–400)
RBC: 3.8 MIL/uL — ABNORMAL LOW (ref 4.22–5.81)
RDW: 14.6 % (ref 11.5–15.5)
WBC: 5.6 10*3/uL (ref 4.0–10.5)
nRBC: 0 % (ref 0.0–0.2)

## 2022-11-12 LAB — BASIC METABOLIC PANEL WITH GFR
Anion gap: 12 (ref 5–15)
BUN: 18 mg/dL (ref 8–23)
CO2: 27 mmol/L (ref 22–32)
Calcium: 9.1 mg/dL (ref 8.9–10.3)
Chloride: 99 mmol/L (ref 98–111)
Creatinine, Ser: 1.78 mg/dL — ABNORMAL HIGH (ref 0.61–1.24)
GFR, Estimated: 38 mL/min — ABNORMAL LOW
Glucose, Bld: 111 mg/dL — ABNORMAL HIGH (ref 70–99)
Potassium: 3.6 mmol/L (ref 3.5–5.1)
Sodium: 138 mmol/L (ref 135–145)

## 2022-11-12 LAB — BRAIN NATRIURETIC PEPTIDE: B Natriuretic Peptide: 52.8 pg/mL (ref 0.0–100.0)

## 2022-11-12 MED ORDER — IPRATROPIUM-ALBUTEROL 0.5-2.5 (3) MG/3ML IN SOLN
3.0000 mL | Freq: Once | RESPIRATORY_TRACT | Status: AC
  Administered 2022-11-12: 3 mL via RESPIRATORY_TRACT

## 2022-11-12 NOTE — ED Triage Notes (Signed)
Pt BIB Ems with SHOB and cough since yesterday. Pt is on 4 L  currently. Pt was picked up from the urgent care.

## 2022-11-12 NOTE — ED Notes (Signed)
Patient is being discharged from the Urgent Care and sent to the Emergency Department via EMS . Per Verna Czech PA, patient is in need of higher level of care due to need for further evaluation . Patient is aware and verbalizes understanding of plan of care.  Vitals:   11/12/22 1908 11/12/22 1909  BP:  (!) 155/77  Pulse:  75  Resp:  (!) 22  Temp:  98 F (36.7 C)  SpO2: 95% 92%

## 2022-11-12 NOTE — ED Notes (Signed)
Patient is being discharged from the Urgent Care and sent to the Emergency Department via EMS . Per Verna Czech, PA-c, patient is in need of higher level of care due to Hypoxia with low SPO2 after treatment. Patient is aware and verbalizes understanding of plan of care.  Vitals:   11/12/22 1746 11/12/22 1747  BP: (!) 160/80   Pulse: 75   Resp: (!) 22   Temp: 98 F (36.7 C)   SpO2: 91% 91%

## 2022-11-12 NOTE — Telephone Encounter (Signed)
Pt walked into the office at 4:55 pm asking for appt for his cough and SOB  He states this has been ongoing for approx 2 wks  He has some discomfort in his chest when he coughs  I advised he needs to go to ED for eval asap  Scheduled acute visit for tomorrow with Dr Lake Bells  He states he will go to [facility] right now since they have less wait time than ED, which he presented to 11/06/22 and waited 7 hours before leaving   Pt was able to ambulate to his vehicle and was speaking in complete sentences  He is aware that he needs to seek emergent care if symptoms persist despite going to UC tonight

## 2022-11-12 NOTE — ED Provider Notes (Addendum)
[facility]    CSN: [id number] Arrival date & time: 11/12/22  1724      History   Chief Complaint Chief Complaint  Patient presents with   Cough   Shortness of Breath    HPI [name] is a 82 y.o. male.   Patient presents today with a 1 week history of worsening productive cough, shortness of breath, fatigue, dyspnea on exertion.  He reports with coughing he has right-sided chest pain and is concerned that he injured her rib but denies any ongoing chest pain.  He has a history of asthma and is followed by pulmonology; has been taking these medications without improvement of symptoms.  He denies history of heart failure though he does have a history of diastolic heart failure according to last echo from 2020 but had normal EF of 60 to 65%.  Denies any known weight gain.  He has had difficulty with daily activities as even walking upstairs has been challenging.  Denies any known sick contacts.  He is currently on cephalexin for concern for infection in his toe by his podiatrist but denies additional antibiotics in the past several months.    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   Hyperlipidemia    Joint pain    Neuromuscular disorder ([place])    Pre-diabetes    Prediabetes    Sleep apnea    uses CPAP nightly   SOB (shortness of breath)    Swallowing difficulty    Swelling of lower extremity    right more than  leg leg   Umbilical hernia     Patient Active Problem List   Diagnosis Date Noted   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/2022   Primary osteoarthritis of left hip 04/23/2022   Osteomyelitis of great toe of right foot ([place]) 04/18/2022   Left hip pain 04/09/2022   Upper airway cough syndrome 02/03/2022   Acute rhinosinusitis 01/24/2022   Hypertension 09/30/2021   Allergic rhinitis due to pollen 03/25/2021   Benign prostatic hyperplasia with lower urinary tract symptoms 03/25/2021   Chronic fatigue syndrome 03/25/2021   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   Chronic kidney disease, stage 3 unspecified ([place]) 03/25/2021   Chronic pain 03/25/2021   Dyslipidemia 03/25/2021   Edema 03/25/2021   Generalized anxiety disorder 03/25/2021   Mild persistent asthma 03/25/2021   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   Morbid obesity ([place]) 03/25/2021   Non-pressure chronic ulcer of other part of right foot limited to breakdown of skin ([place]) 03/25/2021   Prediabetes 03/25/2021   Primary insomnia 03/25/2021   Sleep disturbance 03/25/2021   Paresthesia of skin 06/08/2020   Right foot drop 06/08/2020   Body mass index (BMI) 35.0-35.9, adult 03/13/2020   Spondylosis of cervical region  without myelopathy or radiculopathy 01/05/2020   Elevated blood-pressure reading, without diagnosis of hypertension 12/07/2019   Spinal stenosis of lumbar region with neurogenic claudication 01/15/2017   Spondylolisthesis of lumbar region 01/15/2017   Claw toe, acquired, left 01/06/2017   Achilles tendon contracture, bilateral 01/06/2017   Pain in metatarsus of both feet 01/06/2017   Tubular adenoma of colon 12/29/2016   Basal cell carcinoma 99/35/7017   Umbilical hernia s/p lap repair with mesh 06/27/2016 06/27/2016   Degenerative arthritis of knee 03/22/2015   Dyspnea on exertion 01/31/2015   Lumbar radiculopathy 12/12/2014   Facet hypertrophy of lumbar region 12/12/2014   Left knee pain 12/12/2014   Ulcer  of lower limb ([place]) 01/10/2014   Varicose veins of lower extremities with other complications 79/39/0300   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 12/21/2013   Renal insufficiency 12/21/2013   Swelling in head/neck 07/28/2012   Venous (peripheral) insufficiency 05/25/2012   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   Pain in limb 02/12/2012   Hyperlipidemia 02/28/2008   Allergic rhinitis 02/28/2008   Sleep apnea 02/28/2008    Past Surgical History:  Procedure Laterality Date   ABDOMINAL AORTOGRAM W/LOWER EXTREMITY N/A 03/14/2020   Procedure: ABDOMINAL AORTOGRAM W/LOWER EXTREMITY;  Surgeon: Serafina Mitchell, MD;  Location: [facility];  Service: Vascular;  Laterality: N/A;   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   AMPUTATION TOE Right 04/18/2022   Procedure: AMPUTATION RIGHT GREAT TOE;  Surgeon: [name], MD;  Location: [facility];  Service: Orthopedics;  Laterality: Right;   BACK SURGERY  2003   L4, L5   BACK SURGERY     CATARACT EXTRACTION, BILATERAL     ENDOVENOUS ABLATION SAPHENOUS VEIN W/ LASER Right 06-01-2014   EVLA right greater saphenous vein by Curt Jews MD    epidural steroid injections        [facility]   hernia repair  2003   HERNIA REPAIR     hernia inguinal x 2   TOTAL HIP ARTHROPLASTY Left 05/05/2022   Procedure: LEFT TOTAL HIP ARTHROPLASTY ANTERIOR APPROACH;  Surgeon: [name], MD;  Location: [facility];  Service: Orthopedics;  Laterality: Left;   TOTAL KNEE ARTHROPLASTY Left 03/22/2015   Procedure: TOTAL KNEE ARTHROPLASTY;  Surgeon: Meredith Pel, MD;  Location: [facility];  Service: Orthopedics;  Laterality: Left;   UMBILICAL HERNIA REPAIR N/A 06/27/2016   Procedure: LAPAROSCOPIC ASSISTED REPAIR OF UMBILICAL HERINA WITH MESH;  Surgeon: Johnathan Hausen, MD;  Location[facility];  Service: General;  Laterality: N/A;   VASCULAR SURGERY  2015   right leg       Home Medications    Prior to Admission medications    Medication Sig Start Date End Date Taking? Authorizing Provider  acetaminophen (TYLENOL) 325 MG tablet Take 1-2 tablets (325-650 mg total) by mouth every 6 (six) hours as needed for mild pain (pain score 1-3 or temp > 100.5). 04/23/22   Gary Fleet, PA-C  albuterol (VENTOLIN HFA) 108 (90 Base) MCG/ACT inhaler Inhale 2 puffs into the lungs every 6 (six) hours as needed for wheezing or shortness of breath. 02/03/22   Cobb, Karie Schwalbe, NP  aspirin EC 325 MG tablet Take 1 tablet (325 mg total) by mouth 2 (two) times daily after a meal. Take until one month post op for DVT prophlaxis 05/07/22   Gary Fleet, PA-C  azithromycin (ZITHROMAX) 250 MG tablet Take as directed 10/15/22   Marshell Garfinkel, MD  bisacodyl (DULCOLAX) 5 MG EC tablet Take 1 tablet (5 mg total) by mouth daily as needed for moderate constipation. 04/23/22   Gary Fleet, PA-C  budesonide-formoterol (SYMBICORT) 160-4.5 MCG/ACT inhaler Inhale 2 puffs into the lungs 2 (two) times daily. 08/21/21   Mannam, Hart Robinsons, MD  buPROPion (WELLBUTRIN XL) 150 MG 24 hr tablet Take 450 mg by mouth every morning.    [provider]  CALCIUM PO Take 1 tablet by mouth every morning.    [provider]  cephALEXin (KEFLEX) 500 MG capsule Take 1 capsule (500 mg total) by mouth 3 (three) times daily for 10 days. 11/06/22 11/16/22  Standiford, Nena Alexander, DPM  docusate sodium (COLACE) 100 MG capsule Take 1 capsule (100 mg total) by mouth 2 (two) times daily. 04/23/22   Gary Fleet, PA-C  doxazosin (CARDURA) 8 MG tablet TAKE 1 TABLET BY MOUTH DAILY  . Take at night Patient taking differently: Take 8 mg by mouth at bedtime. 03/11/16   [name], MD  esomeprazole (NEXIUM) 20 MG capsule Take 20 mg by mouth every morning.    [provider]  furosemide (LASIX) 40 MG tablet Take 40 mg by mouth daily.    [provider]  gabapentin (NEURONTIN) 300 MG capsule TAKE 1 TO 2 CAPSULES BY MOUTH AT BEDTIME Patient taking  differently: Take 600 mg by mouth at bedtime. 01/15/17   Tereasa Coop, PA-C  HYDROcodone-acetaminophen (NORCO) 5-325 MG tablet Take 1-2 tablets by mouth every 4 (four) hours as needed for moderate pain (Use sparingly). 05/07/22   Gary Fleet, PA-C  methocarbamol (ROBAXIN) 500 MG tablet Take 1 tablet (500 mg total) by mouth every 8 (eight) hours as needed for muscle spasms. Patient taking differently: Take 500 mg by mouth in the morning and at bedtime. 08/08/21   Loni Beckwith, PA-C  mirtazapine (REMERON) 15 MG tablet Take 15 mg by mouth at bedtime.    [provider]  montelukast (SINGULAIR) 10 MG tablet Take 1 tablet (10 mg total) by mouth at bedtime. 01/24/22   Cobb, Karie Schwalbe, NP  Multiple Vitamin (MULTIVITAMIN WITH MINERALS) TABS tablet Take 1 tablet by mouth every morning.    [provider]  polyethylene glycol (MIRALAX / GLYCOLAX) 17 g packet Take 17 g by mouth daily as needed for mild constipation. 04/23/22   Gary Fleet, PA-C  potassium chloride SA (K-DUR,KLOR-CON) 20 MEQ tablet Take 1 tablet (20 mEq total) by mouth 2 (two) times daily. 07/23/16   [name], MD  predniSONE (DELTASONE) 20 MG tablet Take '40mg'$  for 5 days 07/02/22   Marshell Garfinkel, MD  rosuvastatin (CRESTOR) 5 MG tablet Take 5 mg by mouth every morning. 12/20/18   [provider]    Family History Family History  Problem Relation Age of Onset   Thyroid disease Mother    Heart disease Father    Hypertension Father    Diabetes Maternal Grandfather    Colon cancer Neg Hx    Esophageal cancer Neg Hx     Social History Social History   Tobacco Use   Smoking status: Never   Smokeless tobacco: Never  Vaping Use   Vaping Use: Never used  Substance Use Topics   Alcohol use: Yes    Comment: rare   Drug use: No     Allergies   Patient has no known allergies.   Review of Systems Review of Systems  Constitutional:  Positive for activity change and fatigue. Negative for  appetite change  and fever.  Respiratory:  Positive for cough, chest tightness, shortness of breath and wheezing.   Cardiovascular:  Negative for chest pain, palpitations and leg swelling (at baseline for chronic venous insufficiency).  Gastrointestinal:  Negative for abdominal pain, diarrhea, nausea and vomiting.  Neurological:  Negative for dizziness, light-headedness and headaches.     Physical Exam Triage Vital Signs ED Triage Vitals  Enc Vitals Group     BP 11/12/22 1746 (!) 160/80     Pulse Rate 11/12/22 1746 75     Resp 11/12/22 1746 (!) 22     Temp 11/12/22 1746 98 F (36.7 C)     Temp Source 11/12/22 1746 Oral     SpO2 11/12/22 1746 91 %     Weight 11/12/22 1747 256 lb 2.8 oz (116.2 kg)     Height 11/12/22 1747 '5\' 9"'$  (1.753 m)     Head Circumference --      Peak Flow --      Pain Score 11/12/22 1746 10     Pain Loc --      Pain Edu? --      Excl. in [place]? --    No data found.  Updated Vital Signs BP (!) 155/77 (BP Location: Right Arm)   Pulse 75   Temp 98 F (36.7 C) (Oral)   Resp (!) 22   Ht '5\' 9"'$  (1.753 m)   Wt 256 lb 2.8 oz (116.2 kg)   SpO2 92%   BMI 37.83 kg/m   Visual Acuity Right Eye Distance:   Left Eye Distance:   Bilateral Distance:    Right Eye Near:   Left Eye Near:    Bilateral Near:     Physical Exam Vitals reviewed.  Constitutional:      General: He is awake.     Appearance: Normal appearance. He is well-developed. He is ill-appearing.     Comments: Very pleasant male appears stated age sitting in exam room coughing and wheezing  HENT:     Head: Normocephalic and atraumatic.  Cardiovascular:     Rate and Rhythm: Normal rate and regular rhythm.     Heart sounds: Normal heart sounds, S1 normal and S2 normal. No murmur heard. Pulmonary:     Effort: Pulmonary effort is normal.     Breath sounds: No stridor. Wheezing and rhonchi present. No rales.     Comments: Widespread wheezing and rhonchi Abdominal:     General: Bowel sounds are  normal.     Palpations: Abdomen is soft.     Tenderness: There is no abdominal tenderness.  Musculoskeletal:     Right lower leg: 1+ Edema present.     Left lower leg: 1+ Edema present.  Neurological:     Mental Status: He is alert.  Psychiatric:        Behavior: Behavior is cooperative.      UC Treatments / Results  Labs (all labs ordered are listed, but only abnormal results are displayed) Labs Reviewed - No data to display  EKG   Radiology DG Chest 2 View  Result Date: 11/12/2022 CLINICAL DATA:  Shortness of breath, cough EXAM: CHEST - 2 VIEW COMPARISON:  10/15/2022 FINDINGS: Cardiac size is within normal limits. There are no signs of pulmonary edema or focal pulmonary consolidation. Patient's chin is partly obscuring both apices limiting evaluation. Costophrenic angles are clear. There is no evidence of any significant pneumothorax. Degenerative changes are noted in left shoulder. IMPRESSION: There are no signs of pulmonary edema or  focal pulmonary consolidation. Evaluation of apices is limited by patient's chin. Electronically Signed   By: Elmer Picker M.D.   On: 11/12/2022 18:17    Procedures Procedures (including critical care time)  Medications Ordered in UC Medications  ipratropium-albuterol (DUONEB) 0.5-2.5 (3) MG/3ML nebulizer solution 3 mL (3 mLs Nebulization Given 11/12/22 1824)    Initial Impression / Assessment and Plan / UC Course  I have reviewed the triage vital signs and the nursing notes.  Pertinent labs & imaging results that were available during my care of the patient were reviewed by me and considered in my medical decision making (see chart for details).     EKG was obtained that showed normal sinus rhythm with ventricular rate of 77 bpm; compared to 04/17/2022 no significant change.  Chest x-ray was obtained that showed no obvious abnormality.  Given significant wheezing and rhonchi on exam.  This persisted and he actually had a drop in his oxygen  saturation to 87% following DuoNeb in clinic.  He was placed on 2 L with improvement to 92%.  Given new oxygen requirement and persistent shortness of breath recommended evaluation in the emergency room.  Patient was reluctant but agreeable.  He drove himself here and given he requires oxygen discussed that it is not safe for him to transport himself.  He was transported by EMS.  He was stable at the time of discharge.  Final Clinical Impressions(s) / UC Diagnoses   Final diagnoses:  Acute on chronic respiratory failure with hypoxia [facility])   Discharge Instructions   None    ED Prescriptions   None    PDMP not reviewed this encounter.   Terrilee Croak, PA-C 11/12/22 1907    Journee Kohen, Derry Skill, PA-C 11/12/22 1920

## 2022-11-12 NOTE — ED Notes (Signed)
EMS called

## 2022-11-12 NOTE — ED Triage Notes (Signed)
Pt c/o increase productive cough and right side rib cage pain for a week.

## 2022-11-12 NOTE — ED Provider Triage Note (Signed)
Emergency Medicine Provider Triage Evaluation Note  [name] , a 82 y.o. male  was evaluated in triage.  Pt complains of dyspnea on exertion, cough, leg swelling.  Patient reports that the symptoms began 1 week ago.  Patient was seen at urgent care earlier today and noted to have a new oxygen requirement.  The patient arrives on 4 L of oxygen via nasal cannula, denies oxygen use at home.  Patient states that at urgent care he was noted to have wheezing, rales and rhonchi all over.  The patient had a chest x-ray which was unremarkable.  Patient sent here due to new oxygen requirement.  The patient denies any fevers however does endorse sore throat, body aches and chills for the last 1 week.  Patient denies history of CHF however on chart review shows diastolic heart failure.  Patient states he does take fluid pill sometimes.  Review of Systems  Positive:  Negative:   Physical Exam  BP 134/87 (BP Location: Left Arm)   Pulse 80   Temp 98.1 F (36.7 C) (Oral)   Resp 16   SpO2 97%  Gen:   Awake, no distress   Resp:  Normal effort  MSK:   Moves extremities without difficulty  Other:  Rales and rhonchi throughout  Medical Decision Making  Medically screening exam initiated at 8:33 PM.  Appropriate orders placed.  [name] was informed that the remainder of the evaluation will be completed by another provider, this initial triage assessment does not replace that evaluation, and the importance of remaining in the ED until their evaluation is complete.     Azucena Cecil, PA-C 11/12/22 2034

## 2022-11-12 NOTE — Progress Notes (Deleted)
Office Visit    Patient Name: [name] Date of Encounter: 11/12/2022  Primary Care Provider:  Cari Caraway, MD Primary Cardiologist:  Quay Burow, MD  Chief Complaint    82 year old male with a history of bilateral lower extremity edema, hypertension, hyperlipidemia, CKD stage III, OSA, prediabetes, hypogonadism, asthma, and GERD who presents for follow-up related to lower extremity edema.  Past Medical History     Past Surgical History:  Procedure Laterality Date   ABDOMINAL AORTOGRAM W/LOWER EXTREMITY N/A 03/14/2020   Procedure: ABDOMINAL AORTOGRAM W/LOWER EXTREMITY;  Surgeon: Serafina Mitchell, MD;  Location: [facility];  Service: Vascular;  Laterality: N/A;   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   AMPUTATION TOE Right 04/18/2022   Procedure: AMPUTATION RIGHT GREAT TOE;  Surgeon: [name], MD;  [facility];  Service: Orthopedics;  Laterality: Right;   BACK SURGERY  2003   L4, L5   BACK SURGERY     CATARACT EXTRACTION, BILATERAL     ENDOVENOUS ABLATION SAPHENOUS VEIN W/ LASER Right 06-01-2014   EVLA right greater saphenous vein by Curt Jews MD    epidural steroid injections        [facility]   hernia repair  2003   HERNIA REPAIR     hernia inguinal x 2   TOTAL HIP ARTHROPLASTY Left 05/05/2022   Procedure: LEFT TOTAL HIP ARTHROPLASTY ANTERIOR APPROACH;  Surgeon: [name], MD;  [facility];  Service: Orthopedics;  Laterality: Left;   TOTAL KNEE ARTHROPLASTY Left 03/22/2015   Procedure: TOTAL KNEE ARTHROPLASTY;  Surgeon: Meredith Pel, MD;  Location: [facility];  Service: Orthopedics;  Laterality: Left;   UMBILICAL HERNIA REPAIR N/A 06/27/2016   Procedure: LAPAROSCOPIC ASSISTED REPAIR OF UMBILICAL HERINA WITH MESH;  Surgeon: Johnathan Hausen, MD;  [facility];  Service: General;  Laterality: N/A;   VASCULAR SURGERY  2015   right leg    Allergies  No Known Allergies  History of Present Illness     82 year old male with the above past medical history including bilateral lower extremity edema, hypertension, hyperlipidemia, CKD stage III, OSA, prediabetes, hypogonadism, asthma, and GERD.  He has a family history of CAD.  Cardiac catheterization in 06/2007 was normal.  He underwent prior venous stripping for venous reflux and swelling by Dr. Donnetta Hutching.  Nuclear stress test in 02/2017 showed EF 45%, mild diffuse hypokinesis, no evidence of ischemia, low risk.  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  Repeat echocardiogram in 07/2019 showed EF, G1 DD.  He has a history of asthma and follows with pulmonology.  He was last seen in the office on 04/17/2022 and was stable from a cardiac standpoint.  He was cleared for hip replacement surgery at the time.  He virtually on 09/19/2022 for preoperative cardiac evaluation and was cleared for carpal tunnel release surgery.  He contacted our office on 10/14/2022 and noted a several week history of increased lower extremity edema, shortness of breath, elevated BP.  He presents today for follow-up.  Since his last visit  Bilateral lower extremity edema: Shortness of breath: Hypertension: Hyperlipidemia: CKD stage III: OSA: Disposition:   Home Medications    Current Outpatient Medications  Medication Sig Dispense Refill   acetaminophen (TYLENOL) 325 MG tablet Take 1-2 tablets (325-650 mg total) by mouth every 6 (six) hours as needed for mild pain (pain score 1-3 or  temp > 100.5). 40 tablet 0   albuterol (VENTOLIN HFA) 108 (90 Base) MCG/ACT inhaler Inhale 2 puffs into the lungs every 6 (six) hours as needed for wheezing or shortness of breath. 8 g 2   aspirin EC 325 MG tablet Take 1 tablet (325 mg total) by mouth 2 (two) times daily after a meal. Take until one month post op for DVT prophlaxis 60 tablet 0   azithromycin (ZITHROMAX) 250 MG tablet Take as directed 6 tablet 0   bisacodyl (DULCOLAX) 5 MG EC tablet  Take 1 tablet (5 mg total) by mouth daily as needed for moderate constipation. 30 tablet 0   budesonide-formoterol (SYMBICORT) 160-4.5 MCG/ACT inhaler Inhale 2 puffs into the lungs 2 (two) times daily. 1 each 11   buPROPion (WELLBUTRIN XL) 150 MG 24 hr tablet Take 450 mg by mouth every morning.     CALCIUM PO Take 1 tablet by mouth every morning.     cephALEXin (KEFLEX) 500 MG capsule Take 1 capsule (500 mg total) by mouth 3 (three) times daily for 10 days. 30 capsule 0   docusate sodium (COLACE) 100 MG capsule Take 1 capsule (100 mg total) by mouth 2 (two) times daily. 10 capsule 0   doxazosin (CARDURA) 8 MG tablet TAKE 1 TABLET BY MOUTH DAILY  . Take at night (Patient taking differently: Take 8 mg by mouth at bedtime.) 30 tablet 11   esomeprazole (NEXIUM) 20 MG capsule Take 20 mg by mouth every morning.     furosemide (LASIX) 40 MG tablet Take 40 mg by mouth daily.     gabapentin (NEURONTIN) 300 MG capsule TAKE 1 TO 2 CAPSULES BY MOUTH AT BEDTIME (Patient taking differently: Take 600 mg by mouth at bedtime.) 60 capsule 0   HYDROcodone-acetaminophen (NORCO) 5-325 MG tablet Take 1-2 tablets by mouth every 4 (four) hours as needed for moderate pain (Use sparingly). 40 tablet 0   methocarbamol (ROBAXIN) 500 MG tablet Take 1 tablet (500 mg total) by mouth every 8 (eight) hours as needed for muscle spasms. (Patient taking differently: Take 500 mg by mouth in the morning and at bedtime.) 20 tablet 0   mirtazapine (REMERON) 15 MG tablet Take 15 mg by mouth at bedtime.     montelukast (SINGULAIR) 10 MG tablet Take 1 tablet (10 mg total) by mouth at bedtime. 30 tablet 5   Multiple Vitamin (MULTIVITAMIN WITH MINERALS) TABS tablet Take 1 tablet by mouth every morning.     polyethylene glycol (MIRALAX / GLYCOLAX) 17 g packet Take 17 g by mouth daily as needed for mild constipation. 14 each 0   potassium chloride SA (K-DUR,KLOR-CON) 20 MEQ tablet Take 1 tablet (20 mEq total) by mouth 2 (two) times daily. 18  tablet 3   predniSONE (DELTASONE) 20 MG tablet Take '40mg'$  for 5 days 10 tablet 0   rosuvastatin (CRESTOR) 5 MG tablet Take 5 mg by mouth every morning.     No current facility-administered medications for this visit.     Review of Systems    ***.  All other systems reviewed and are otherwise negative except as noted above.    Physical Exam    VS:  There were no vitals taken for this visit. , BMI There is no height or weight on file to calculate BMI.     GEN: Well nourished, well developed, in no acute distress. HEENT: normal. Neck: Supple, no JVD, carotid bruits, or masses. Cardiac: RRR, no murmurs, rubs, or gallops. No clubbing, cyanosis, edema.  Radials/DP/PT 2+ and equal bilaterally.  Respiratory:  Respirations regular and unlabored, clear to auscultation bilaterally. GI: Soft, nontender, nondistended, BS + x 4. MS: no deformity or atrophy. Skin: warm and dry, no rash. Neuro:  Strength and sensation are intact. Psych: Normal affect.  Accessory Clinical Findings    ECG personally reviewed by me today - *** - no acute changes.   Lab Results  Component Value Date   WBC 6.4 11/01/2022   HGB 12.1 (L) 11/01/2022   HCT 36.5 (L) 11/01/2022   MCV 98.1 11/01/2022   PLT 165 11/01/2022   Lab Results  Component Value Date   CREATININE 2.08 (H) 11/01/2022   BUN 28 (H) 11/01/2022   NA 142 11/01/2022   K 3.6 11/01/2022   CL 107 11/01/2022   CO2 26 11/01/2022   Lab Results  Component Value Date   ALT 49 (H) 05/08/2022   AST 48 (H) 05/08/2022   ALKPHOS 115 05/08/2022   BILITOT 0.7 05/08/2022   Lab Results  Component Value Date   CHOL 175 03/11/2016   HDL 38 (L) 03/11/2016   LDLCALC 97 03/11/2016   TRIG 199 (H) 03/11/2016   CHOLHDL 4.6 03/11/2016    Lab Results  Component Value Date   HGBA1C 5.7 (H) 04/16/2022    Assessment & Plan    1.  ***  No BP recorded.  {Refresh Note OR Click here to enter BP  :1}***   Lenna Sciara, NP 11/12/2022, 6:03 AM

## 2022-11-13 ENCOUNTER — Ambulatory Visit: Payer: Medicare Other | Admitting: Pulmonary Disease

## 2022-11-13 ENCOUNTER — Encounter (HOSPITAL_COMMUNITY): Payer: Self-pay | Admitting: Emergency Medicine

## 2022-11-13 ENCOUNTER — Telehealth: Payer: Self-pay | Admitting: Pulmonary Disease

## 2022-11-13 DIAGNOSIS — J441 Chronic obstructive pulmonary disease with (acute) exacerbation: Secondary | ICD-10-CM | POA: Diagnosis present

## 2022-11-13 DIAGNOSIS — N1832 Chronic kidney disease, stage 3b: Secondary | ICD-10-CM | POA: Diagnosis present

## 2022-11-13 DIAGNOSIS — R059 Cough, unspecified: Secondary | ICD-10-CM | POA: Diagnosis not present

## 2022-11-13 DIAGNOSIS — J09X1 Influenza due to identified novel influenza A virus with pneumonia: Secondary | ICD-10-CM | POA: Diagnosis not present

## 2022-11-13 DIAGNOSIS — J4531 Mild persistent asthma with (acute) exacerbation: Secondary | ICD-10-CM | POA: Diagnosis present

## 2022-11-13 DIAGNOSIS — K921 Melena: Secondary | ICD-10-CM | POA: Diagnosis present

## 2022-11-13 DIAGNOSIS — N400 Enlarged prostate without lower urinary tract symptoms: Secondary | ICD-10-CM | POA: Diagnosis present

## 2022-11-13 DIAGNOSIS — Z89022 Acquired absence of left finger(s): Secondary | ICD-10-CM | POA: Diagnosis not present

## 2022-11-13 DIAGNOSIS — F32A Depression, unspecified: Secondary | ICD-10-CM | POA: Diagnosis present

## 2022-11-13 DIAGNOSIS — R7303 Prediabetes: Secondary | ICD-10-CM | POA: Diagnosis present

## 2022-11-13 DIAGNOSIS — K219 Gastro-esophageal reflux disease without esophagitis: Secondary | ICD-10-CM | POA: Diagnosis present

## 2022-11-13 DIAGNOSIS — B974 Respiratory syncytial virus as the cause of diseases classified elsewhere: Secondary | ICD-10-CM | POA: Diagnosis present

## 2022-11-13 DIAGNOSIS — E669 Obesity, unspecified: Secondary | ICD-10-CM | POA: Diagnosis present

## 2022-11-13 DIAGNOSIS — J440 Chronic obstructive pulmonary disease with acute lower respiratory infection: Secondary | ICD-10-CM | POA: Diagnosis present

## 2022-11-13 DIAGNOSIS — J9601 Acute respiratory failure with hypoxia: Secondary | ICD-10-CM | POA: Diagnosis present

## 2022-11-13 DIAGNOSIS — Z96642 Presence of left artificial hip joint: Secondary | ICD-10-CM | POA: Diagnosis present

## 2022-11-13 DIAGNOSIS — Z8249 Family history of ischemic heart disease and other diseases of the circulatory system: Secondary | ICD-10-CM | POA: Diagnosis not present

## 2022-11-13 DIAGNOSIS — Z89411 Acquired absence of right great toe: Secondary | ICD-10-CM | POA: Diagnosis not present

## 2022-11-13 DIAGNOSIS — E785 Hyperlipidemia, unspecified: Secondary | ICD-10-CM | POA: Diagnosis present

## 2022-11-13 DIAGNOSIS — J1000 Influenza due to other identified influenza virus with unspecified type of pneumonia: Secondary | ICD-10-CM | POA: Diagnosis present

## 2022-11-13 DIAGNOSIS — G4733 Obstructive sleep apnea (adult) (pediatric): Secondary | ICD-10-CM | POA: Diagnosis present

## 2022-11-13 DIAGNOSIS — Z8349 Family history of other endocrine, nutritional and metabolic diseases: Secondary | ICD-10-CM | POA: Diagnosis not present

## 2022-11-13 DIAGNOSIS — Z1152 Encounter for screening for COVID-19: Secondary | ICD-10-CM | POA: Diagnosis not present

## 2022-11-13 DIAGNOSIS — K649 Unspecified hemorrhoids: Secondary | ICD-10-CM | POA: Diagnosis present

## 2022-11-13 DIAGNOSIS — J101 Influenza due to other identified influenza virus with other respiratory manifestations: Secondary | ICD-10-CM | POA: Diagnosis present

## 2022-11-13 DIAGNOSIS — Z833 Family history of diabetes mellitus: Secondary | ICD-10-CM | POA: Diagnosis not present

## 2022-11-13 DIAGNOSIS — Z6837 Body mass index (BMI) 37.0-37.9, adult: Secondary | ICD-10-CM | POA: Diagnosis not present

## 2022-11-13 LAB — RESP PANEL BY RT-PCR (RSV, FLU A&B, COVID)  RVPGX2
Influenza A by PCR: POSITIVE — AB
Influenza B by PCR: NEGATIVE
Resp Syncytial Virus by PCR: POSITIVE — AB
SARS Coronavirus 2 by RT PCR: NEGATIVE

## 2022-11-13 MED ORDER — POLYETHYLENE GLYCOL 3350 17 G PO PACK: 17.0000 g | PACK | Freq: Every day | ORAL | Status: DC | PRN

## 2022-11-13 MED ORDER — METHOCARBAMOL 500 MG PO TABS
500.0000 mg | ORAL_TABLET | Freq: Two times a day (BID) | ORAL | Status: DC
  Administered 2022-11-13 – 2022-11-23 (×20): 500 mg via ORAL
  Filled 2022-11-13 (×20): qty 1

## 2022-11-13 MED ORDER — IPRATROPIUM-ALBUTEROL 0.5-2.5 (3) MG/3ML IN SOLN
3.0000 mL | Freq: Four times a day (QID) | RESPIRATORY_TRACT | Status: DC
  Administered 2022-11-13 (×3): 3 mL via RESPIRATORY_TRACT
  Filled 2022-11-13 (×3): qty 3

## 2022-11-13 MED ORDER — LACTATED RINGERS IV SOLN: INTRAVENOUS | Status: DC

## 2022-11-13 MED ORDER — ONDANSETRON HCL 4 MG PO TABS: 4.0000 mg | ORAL_TABLET | Freq: Four times a day (QID) | ORAL | Status: DC | PRN

## 2022-11-13 MED ORDER — OSELTAMIVIR PHOSPHATE 30 MG PO CAPS: 30.0000 mg | ORAL_CAPSULE | Freq: Every day | ORAL | Status: DC

## 2022-11-13 MED ORDER — ACETAMINOPHEN 650 MG RE SUPP: 650.0000 mg | Freq: Four times a day (QID) | RECTAL | Status: DC | PRN

## 2022-11-13 MED ORDER — PANTOPRAZOLE SODIUM 40 MG PO TBEC: 40.0000 mg | DELAYED_RELEASE_TABLET | Freq: Every day | ORAL | Status: DC

## 2022-11-13 MED ORDER — ENOXAPARIN SODIUM 40 MG/0.4ML IJ SOSY
40.0000 mg | PREFILLED_SYRINGE | INTRAMUSCULAR | Status: DC
  Administered 2022-11-13 – 2022-11-23 (×10): 40 mg via SUBCUTANEOUS
  Filled 2022-11-13 (×11): qty 0.4

## 2022-11-13 MED ORDER — ACETAMINOPHEN 325 MG PO TABS: 650.0000 mg | ORAL_TABLET | Freq: Four times a day (QID) | ORAL | Status: DC | PRN

## 2022-11-13 MED ORDER — SIMETHICONE 40 MG/0.6ML PO SUSP
40.0000 mg | Freq: Once | ORAL | Status: AC
  Administered 2022-11-13: 40 mg via ORAL
  Filled 2022-11-13: qty 0.6

## 2022-11-13 MED ORDER — BISACODYL 5 MG PO TBEC: 5.0000 mg | DELAYED_RELEASE_TABLET | Freq: Every day | ORAL | Status: DC | PRN

## 2022-11-13 MED ORDER — SODIUM CHLORIDE 0.9% FLUSH
3.0000 mL | Freq: Two times a day (BID) | INTRAVENOUS | Status: DC
  Administered 2022-11-13 – 2022-11-23 (×19): 3 mL via INTRAVENOUS

## 2022-11-13 MED ORDER — ONDANSETRON HCL 4 MG/2ML IJ SOLN: 4.0000 mg | Freq: Four times a day (QID) | INTRAMUSCULAR | Status: DC | PRN

## 2022-11-13 MED ORDER — IPRATROPIUM BROMIDE 0.06 % NA SOLN
2.0000 | Freq: Every day | NASAL | Status: DC
  Administered 2022-11-15 – 2022-11-22 (×7): 2 via NASAL
  Filled 2022-11-13: qty 15

## 2022-11-13 MED ORDER — IPRATROPIUM-ALBUTEROL 0.5-2.5 (3) MG/3ML IN SOLN
3.0000 mL | Freq: Three times a day (TID) | RESPIRATORY_TRACT | Status: DC
  Administered 2022-11-14 – 2022-11-22 (×22): 3 mL via RESPIRATORY_TRACT
  Filled 2022-11-13 (×25): qty 3

## 2022-11-13 MED ORDER — HYDROCODONE-ACETAMINOPHEN 5-325 MG PO TABS
1.0000 | ORAL_TABLET | ORAL | Status: DC | PRN
  Administered 2022-11-14 – 2022-11-23 (×10): 1 via ORAL
  Filled 2022-11-13 (×2): qty 1
  Filled 2022-11-13: qty 2
  Filled 2022-11-13 (×7): qty 1

## 2022-11-13 MED ORDER — FENTANYL CITRATE PF 50 MCG/ML IJ SOSY
50.0000 ug | PREFILLED_SYRINGE | Freq: Once | INTRAMUSCULAR | Status: AC
  Administered 2022-11-13: 50 ug via INTRAVENOUS
  Filled 2022-11-13: qty 1

## 2022-11-13 MED ORDER — DOCUSATE SODIUM 100 MG PO CAPS
100.0000 mg | ORAL_CAPSULE | Freq: Two times a day (BID) | ORAL | Status: DC
  Administered 2022-11-14 – 2022-11-22 (×9): 100 mg via ORAL
  Filled 2022-11-13 (×16): qty 1

## 2022-11-13 MED ORDER — OSELTAMIVIR PHOSPHATE 30 MG PO CAPS
30.0000 mg | ORAL_CAPSULE | Freq: Two times a day (BID) | ORAL | Status: DC
  Administered 2022-11-13: 30 mg via ORAL
  Filled 2022-11-13: qty 1

## 2022-11-13 MED ORDER — ALBUTEROL SULFATE (2.5 MG/3ML) 0.083% IN NEBU: 2.5000 mg | INHALATION_SOLUTION | RESPIRATORY_TRACT | Status: DC | PRN

## 2022-11-13 MED ORDER — MIRTAZAPINE 15 MG PO TABS
15.0000 mg | ORAL_TABLET | Freq: Every day | ORAL | Status: DC
  Administered 2022-11-13 – 2022-11-22 (×10): 15 mg via ORAL
  Filled 2022-11-13 (×8): qty 1
  Filled 2022-11-13 (×2): qty 2
  Filled 2022-11-13: qty 1

## 2022-11-13 MED ORDER — OXYMETAZOLINE HCL 0.05 % NA SOLN
1.0000 | Freq: Two times a day (BID) | NASAL | Status: AC
  Administered 2022-11-13 – 2022-11-15 (×4): 1 via NASAL
  Filled 2022-11-13: qty 15
  Filled 2022-11-13: qty 30

## 2022-11-13 MED ORDER — BUPROPION HCL ER (XL) 150 MG PO TB24
450.0000 mg | ORAL_TABLET | Freq: Every morning | ORAL | Status: DC
  Administered 2022-11-14 – 2022-11-23 (×10): 450 mg via ORAL
  Filled 2022-11-13 (×11): qty 3

## 2022-11-13 MED ORDER — MONTELUKAST SODIUM 10 MG PO TABS
10.0000 mg | ORAL_TABLET | Freq: Every day | ORAL | Status: DC
  Administered 2022-11-13 – 2022-11-22 (×10): 10 mg via ORAL
  Filled 2022-11-13 (×10): qty 1

## 2022-11-13 MED ORDER — ONDANSETRON HCL 4 MG/2ML IJ SOLN
4.0000 mg | Freq: Once | INTRAMUSCULAR | Status: AC
  Administered 2022-11-13: 4 mg via INTRAVENOUS
  Filled 2022-11-13: qty 2

## 2022-11-13 MED ORDER — METHYLPREDNISOLONE SODIUM SUCC 40 MG IJ SOLR
40.0000 mg | Freq: Two times a day (BID) | INTRAMUSCULAR | Status: DC
  Administered 2022-11-13 – 2022-11-14 (×3): 40 mg via INTRAVENOUS
  Filled 2022-11-13 (×3): qty 1

## 2022-11-13 MED ORDER — ROSUVASTATIN CALCIUM 5 MG PO TABS
5.0000 mg | ORAL_TABLET | Freq: Every morning | ORAL | Status: DC
  Administered 2022-11-13 – 2022-11-23 (×11): 5 mg via ORAL
  Filled 2022-11-13 (×11): qty 1

## 2022-11-13 MED ORDER — ADULT MULTIVITAMIN W/MINERALS CH
1.0000 | ORAL_TABLET | Freq: Every morning | ORAL | Status: DC
  Administered 2022-11-14 – 2022-11-23 (×10): 1 via ORAL
  Filled 2022-11-13 (×11): qty 1

## 2022-11-13 MED ORDER — BUDESONIDE 0.5 MG/2ML IN SUSP
0.5000 mg | Freq: Two times a day (BID) | RESPIRATORY_TRACT | Status: DC
  Administered 2022-11-13 – 2022-11-22 (×20): 0.5 mg via RESPIRATORY_TRACT
  Filled 2022-11-13 (×21): qty 2

## 2022-11-13 MED ORDER — OSELTAMIVIR PHOSPHATE 30 MG PO CAPS
30.0000 mg | ORAL_CAPSULE | Freq: Two times a day (BID) | ORAL | Status: AC
  Administered 2022-11-13 – 2022-11-17 (×10): 30 mg via ORAL
  Filled 2022-11-13 (×10): qty 1

## 2022-11-13 MED ORDER — GABAPENTIN 300 MG PO CAPS
600.0000 mg | ORAL_CAPSULE | Freq: Every day | ORAL | Status: DC
  Administered 2022-11-13 – 2022-11-22 (×10): 600 mg via ORAL
  Filled 2022-11-13 (×10): qty 2

## 2022-11-13 NOTE — ED Notes (Signed)
Patient assisted with use of urinal.

## 2022-11-13 NOTE — Progress Notes (Signed)
Connected patient to continuous bedside pulse ox monitor. 

## 2022-11-13 NOTE — H&P (Addendum)
History and Physical    Patient: [name] IZT:[id number] DOB: 22-Mar-1941 DOA: 11/12/2022 DOS: the patient was seen and examined on 11/13/2022 PCP: Cari Caraway, MD  Patient coming from: Home  Chief Complaint:  Chief Complaint  Patient presents with   Shortness of Breath   Cough   HPI: [name] is a 82 y.o. 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  Patient has been having issues with recurrent respiratory symptoms since the beginning of December.  He was initially treated with a Z-Pak starting on 12/6 by his pulmonologist.  Later steroids were added by his PCP.  He never really got fully better and over the past 2 days has had increasing respiratory symptoms to the point he was having significant dyspnea at rest and on exertion.  He presented to urgent care where he was found to be hypoxemic.  He was also found to be positive for both influenza A and RSV.  He has been requiring anywhere from 3 to 5 L of oxygen.  Nasal cannula delivery has been difficult given his significant increase in nasal congestion.  Given a dose of Tamiflu which made him nauseous and caused increased reflux symptoms.  We have been asked by the emergency room team to admit this patient for further evaluation and treatment.  Upon my evaluation of the patient he was sitting on the side of the bed after having just voided.  He was having difficulty breathing and O2 sats were decreasing as low as 90%.  He has been having more mouth breathing than nasal breathing also makes it difficult for him to maintain appropriate O2 saturations on nasal cannula.  He has a productive cough of clear sputum.   Review of Systems: As mentioned in the history of present illness. All other systems reviewed and are  negative. 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   Hyperlipidemia    Joint pain    Neuromuscular disorder (HCC)    Pre-diabetes    Prediabetes    Sleep apnea    uses CPAP nightly   SOB (shortness of breath)    Swallowing difficulty    Swelling of lower extremity    right more than leg leg   Umbilical hernia    Past Surgical History:  Procedure Laterality Date   ABDOMINAL AORTOGRAM W/LOWER EXTREMITY N/A 03/14/2020   Procedure: ABDOMINAL AORTOGRAM W/LOWER EXTREMITY;  Surgeon: Serafina Mitchell, MD;  Location: [facility];  Service: Vascular;  Laterality: N/A;   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  AMPUTATION TOE Right 04/18/2022   Procedure: AMPUTATION RIGHT GREAT TOE;  Surgeon: [name], MD;  Location: [facility];  Service: Orthopedics;  Laterality: Right;   BACK SURGERY  2003   L4, L5   BACK SURGERY     CATARACT EXTRACTION, BILATERAL     ENDOVENOUS ABLATION SAPHENOUS VEIN W/ LASER Right 06-01-2014   EVLA right greater saphenous vein by Curt Jews MD    epidural steroid injections        [facility]   hernia repair  2003   HERNIA REPAIR     hernia inguinal x 2   TOTAL HIP ARTHROPLASTY Left 05/05/2022   Procedure: LEFT TOTAL HIP ARTHROPLASTY ANTERIOR APPROACH;  Surgeon: [name], MD;  Location: [facility];  Service: Orthopedics;  Laterality: Left;   TOTAL KNEE ARTHROPLASTY Left 03/22/2015    Procedure: TOTAL KNEE ARTHROPLASTY;  Surgeon: Meredith Pel, MD;  Location: [place];  Service: Orthopedics;  Laterality: Left;   UMBILICAL HERNIA REPAIR N/A 06/27/2016   Procedure: LAPAROSCOPIC ASSISTED REPAIR OF UMBILICAL HERINA WITH MESH;  Surgeon: Johnathan Hausen, MD;  Location: [facility];  Service: General;  Laterality: N/A;   VASCULAR SURGERY  2015   right leg   Social History:  reports that he has never smoked. He has never used smokeless tobacco. He reports current alcohol use. He reports that he does not use drugs.  No Known Allergies  Family History  Problem Relation Age of Onset   Thyroid disease Mother    Heart disease Father    Hypertension Father    Diabetes Maternal Grandfather    Colon cancer Neg Hx    Esophageal cancer Neg Hx     Prior to Admission medications   Medication Sig Start Date End Date Taking? Authorizing Provider  acetaminophen (TYLENOL) 325 MG tablet Take 1-2 tablets (325-650 mg total) by mouth every 6 (six) hours as needed for mild pain (pain score 1-3 or temp > 100.5). 04/23/22   Gary Fleet, PA-C  albuterol (VENTOLIN HFA) 108 (90 Base) MCG/ACT inhaler Inhale 2 puffs into the lungs every 6 (six) hours as needed for wheezing or shortness of breath. 02/03/22   Cobb, Karie Schwalbe, NP  aspirin EC 325 MG tablet Take 1 tablet (325 mg total) by mouth 2 (two) times daily after a meal. Take until one month post op for DVT prophlaxis 05/07/22   Gary Fleet, PA-C  azithromycin (ZITHROMAX) 250 MG tablet Take as directed 10/15/22   Mannam, Hart Robinsons, MD  bisacodyl (DULCOLAX) 5 MG EC tablet Take 1 tablet (5 mg total) by mouth daily as needed for moderate constipation. 04/23/22   Gary Fleet, PA-C  budesonide-formoterol (SYMBICORT) 160-4.5 MCG/ACT inhaler Inhale 2 puffs into the lungs 2 (two) times daily. 08/21/21   Mannam, Hart Robinsons, MD  buPROPion (WELLBUTRIN XL) 150 MG 24 hr tablet Take 450 mg by mouth every morning.    [provider]  CALCIUM PO Take 1 tablet  by mouth every morning.    [provider]  cephALEXin (KEFLEX) 500 MG capsule Take 1 capsule (500 mg total) by mouth 3 (three) times daily for 10 days. 11/06/22 11/16/22  Standiford, Nena Alexander, DPM  docusate sodium (COLACE) 100 MG capsule Take 1 capsule (100 mg total) by mouth 2 (two) times daily. 04/23/22   Gary Fleet, PA-C  doxazosin (CARDURA) 8 MG tablet TAKE 1 TABLET BY MOUTH DAILY  . Take at night Patient taking differently: Take 8 mg by mouth at bedtime. 03/11/16   Daub,  Loura Back, MD  esomeprazole (NEXIUM) 20 MG capsule Take 20 mg by mouth every morning.    [provider]  furosemide (LASIX) 40 MG tablet Take 40 mg by mouth daily.    [provider]  gabapentin (NEURONTIN) 300 MG capsule TAKE 1 TO 2 CAPSULES BY MOUTH AT BEDTIME Patient taking differently: Take 600 mg by mouth at bedtime. 01/15/17   Tereasa Coop, PA-C  HYDROcodone-acetaminophen (NORCO) 5-325 MG tablet Take 1-2 tablets by mouth every 4 (four) hours as needed for moderate pain (Use sparingly). 05/07/22   Gary Fleet, PA-C  methocarbamol (ROBAXIN) 500 MG tablet Take 1 tablet (500 mg total) by mouth every 8 (eight) hours as needed for muscle spasms. Patient taking differently: Take 500 mg by mouth in the morning and at bedtime. 08/08/21   Loni Beckwith, PA-C  mirtazapine (REMERON) 15 MG tablet Take 15 mg by mouth at bedtime.    [provider]  montelukast (SINGULAIR) 10 MG tablet Take 1 tablet (10 mg total) by mouth at bedtime. 01/24/22   Cobb, Karie Schwalbe, NP  Multiple Vitamin (MULTIVITAMIN WITH MINERALS) TABS tablet Take 1 tablet by mouth every morning.    [provider]  polyethylene glycol (MIRALAX / GLYCOLAX) 17 g packet Take 17 g by mouth daily as needed for mild constipation. 04/23/22   Gary Fleet, PA-C  potassium chloride SA (K-DUR,KLOR-CON) 20 MEQ tablet Take 1 tablet (20 mEq total) by mouth 2 (two) times daily. 07/23/16   [name], MD  predniSONE  (DELTASONE) 20 MG tablet Take '40mg'$  for 5 days 07/02/22   Marshell Garfinkel, MD  rosuvastatin (CRESTOR) 5 MG tablet Take 5 mg by mouth every morning. 12/20/18   [provider]    Physical Exam: Vitals:   11/13/22 0500 11/13/22 0600 11/13/22 0627 11/13/22 0630  BP: (!) 140/75   (!) 128/97  Pulse: 81 73  82  Resp: (!) 23   (!) 24  Temp:   97.9 F (36.6 C)   TempSrc:   Oral   SpO2: 96% 96%  96%   Constitutional: NAD, calm, uncomfortable 2/2 excessive nasal congestion Eyes: PERRL, lids and conjunctivae normal ENMT: Mucous membranes are moist.  Nares are boggy with clear drainage noted.    Neck: normal, supple, no masses, no thyromegaly Respiratory: Diffuse fine wheezes throughout all lung fields.  Increased work of breathing and some tachypnea with movement.  O2 sats on nasal cannula at 4 L down to 90% with movement.  O2 increased to 5 L and once patient back in stretcher and resting sats up to 97%. Cardiovascular: Regular rate and rhythm, no murmurs / rubs / gallops. No extremity edema. 2+ pedal pulses.   Abdomen: no tenderness, no masses palpated. No obvious hepatosplenomegaly difficult to assess secondary to large body habitus. Bowel sounds positive.  Musculoskeletal: no clubbing / cyanosis. No joint deformity upper and lower extremities. Good ROM, no contractures. Normal muscle tone.  Skin: no rashes, lesions, ulcers. No induration Neurologic: CN 2-12 grossly intact. Sensation intact, Strength 5/5 x all 4 extremities.  Position self from side of stretcher to bed with minimal assistance Psychiatric: Normal judgment and insight. Alert and oriented x 3. Normal mood.   Data Reviewed:  Sodium 138, potassium 3.6, CO2 27, glucose 111, BUN 18, creatinine 1.78 which is at baseline, GFR 38, BNP 52.8, WBC 5600, platelets 216,000, hemoglobin 12.2, positive for flu A and RSV  Chest x-ray without any signs of pulmonary edema or focal pulmonary consolidation.  Imaging limited by patient's body  habitus.  CT of the chest without contrast or lower lobe bronchial wall thickening with mild patchy airspace disease either atelectasis or infiltrate.  Also right middle lobe pulmonary nodules measuring up to 5 mm no follow-up needed if patient is low risk if patient high risk recommendation is for noncontrast chest CT in 12 months   Assessment /plan Acute respiratory failure with hypoxia secondary to influenza A and RSV infection associated acute bronchitis Supportive care with oxygen, Duonebs, budesonide nebs, and low-dose systemic steroids Continue Tamiflu-if patient continues to have significant nausea can discontinue this medication Utilize both Afrin intranasally twice daily and Atrovent intranasally at bedtime to help with nasal congestion symptoms Have also changed oxygen order to Ventimask to allow for more mouth breathing.  This should be humidified and help with patient's other symptoms  COPD not on chronic oxygen Bronchodilators and inhaled steroids as above  OSA on CPAP CPAP with O2 bleed in order for acute hospital stay given hypoxemia Requested nasal mask as per patient's usual regimen Gust with patient that given his nasal congestion he may need to try a fullface mask.  He is aware and will at least attempt to do that if it is indicated  Stage IIIb chronic kidney disease Function stable and at baseline of creatinine 1.7-1.8 with a GFR in the 30s Avoid any nephrotoxic medications  Pulmonary nodules Radiologist recommends for high risk patients follow-up imaging with noncontrast CT in 12 months  Prediabetes Follow serum glucose in context of receiving IV steroids-if glucose remains significantly elevated may need to follow CBGs and provide SSI  HLD Continue Crestor  GERD Continue PPI  Addendum: Chronic venous stasis, followed by vascular and vein specialist, reports recently had negative dvt studies, elevate legs , continue compression stocking, d/c ivf  He does  not have a history of copd ( he is a never smoker),  he does has h/o asthma follows pulmonology Dr Vaughan Browner     Advance Care Planning:   Code Status: Full Code   VTE Prophylaxis: Lovenox  Consults: None  Family Communication: Patient only  Severity of Illness: The appropriate patient status for this patient is INPATIENT. Inpatient status is judged to be reasonable and necessary in order to provide the required intensity of service to ensure the patient's safety. The patient's presenting symptoms, physical exam findings, and initial radiographic and laboratory data in the context of their chronic comorbidities is felt to place them at high risk for further clinical deterioration. Furthermore, it is not anticipated that the patient will be medically stable for discharge from the hospital within 2 midnights of admission.   * I certify that at the point of admission it is my clinical judgment that the patient will require inpatient hospital care spanning beyond 2 midnights from the point of admission due to high intensity of service, high risk for further deterioration and high frequency of surveillance required.*  Author: Erin Hearing, NP 11/13/2022 7:09 AM  For on call review [email or web address].

## 2022-11-13 NOTE — Telephone Encounter (Signed)
Called by Mr. [name] who was upset that he has been in the ED for several hours now has not been seen. By the time I could call him back, he was seen and has tested positive for Influenza A and RSV. From his ED chart it looks as if Tamiflu has been ordered and the hospitalist consulted for hospital admission. He states that he had a clinic appointment today with Dr. Lake Bells which he obviously will not be able to keep.

## 2022-11-13 NOTE — Plan of Care (Signed)

## 2022-11-13 NOTE — ED Provider Notes (Signed)
[facility] Emergency Department Provider Note MRN:  [id number]  Arrival date & time: 11/13/22     Chief Complaint   Shortness of Breath and Cough   History of Present Illness   [name] is a 82 y.o. year-old male with a history of CKD, obstructive lung disease presenting to the ED with chief complaint of shortness of breath.  Cough and shortness of breath for the past week, got a bit better with antibiotics and steroids from pulmonologist but now much worse over the past day or 2.  New oxygen requirement at urgent care, sent here for evaluation.  No chest pain, has been having some increased swelling in the legs over the past few weeks as well.  Review of Systems  A thorough review of systems was obtained and all systems are negative except as noted in the HPI and PMH.   Patient's Health History    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   Hyperlipidemia    Joint pain    Neuromuscular disorder (HCC)    Pre-diabetes    Prediabetes    Sleep apnea    uses CPAP nightly   SOB (shortness of breath)    Swallowing difficulty    Swelling of lower extremity    right more than leg leg   Umbilical hernia     Past Surgical History:  Procedure Laterality Date   ABDOMINAL AORTOGRAM W/LOWER EXTREMITY N/A 03/14/2020   Procedure: ABDOMINAL AORTOGRAM W/LOWER EXTREMITY;  Surgeon: Serafina Mitchell, MD;   Location: [facility];  Service: Vascular;  Laterality: N/A;   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   AMPUTATION TOE Right 04/18/2022   Procedure: AMPUTATION RIGHT GREAT TOE;  Surgeon: [name], MD;  Location: [facility];  Service: Orthopedics;  Laterality: Right;   BACK SURGERY  2003   L4, L5   BACK SURGERY     CATARACT EXTRACTION, BILATERAL     ENDOVENOUS ABLATION SAPHENOUS VEIN W/ LASER Right 06-01-2014   EVLA right greater saphenous vein by Curt Jews MD    epidural steroid injections        [facility]   hernia repair  2003   HERNIA REPAIR     hernia inguinal x 2   TOTAL HIP ARTHROPLASTY Left 05/05/2022   Procedure: LEFT TOTAL HIP ARTHROPLASTY ANTERIOR APPROACH;  Surgeon: [name], MD;  Location: [facility];  Service: Orthopedics;  Laterality: Left;   TOTAL KNEE ARTHROPLASTY Left 03/22/2015   Procedure: TOTAL KNEE ARTHROPLASTY;  Surgeon: Meredith Pel, MD;  Location: [place];  Service: Orthopedics;  Laterality: Left;   UMBILICAL HERNIA REPAIR N/A 06/27/2016   Procedure: LAPAROSCOPIC ASSISTED REPAIR OF UMBILICAL HERINA WITH MESH;  Surgeon: Johnathan Hausen, MD;  Location: [facility];  Service: General;  Laterality: N/A;   VASCULAR SURGERY  2015   right leg    Family History  Problem Relation Age of Onset   Thyroid disease Mother    Heart disease Father    Hypertension Father    Diabetes Maternal Grandfather    Colon cancer Neg Hx    Esophageal cancer Neg Hx     Social History   Socioeconomic History   Marital status: Single    Spouse name: Not on file   Number of children: Not on file   Years of education: Not on file   Highest education level: Not on file  Occupational History   Occupation: Antiques/Real Estate    Employer: RETIRED  Tobacco Use   Smoking status: Never   Smokeless tobacco: Never  Vaping Use   Vaping Use: Never used  Substance and Sexual Activity   Alcohol use: Yes    Comment: rare   Drug use: No   Sexual  activity: Yes    Birth control/protection: Condom  Other Topics Concern   Not on file  Social History Narrative   ** Merged History Encounter **       Single. Education: Other.    Social Determinants of Health   Financial Resource Strain: Not on file  Food Insecurity: Not on file  Transportation Needs: Not on file  Physical Activity: Not on file  Stress: Not on file  Social Connections: Not on file  Intimate Partner Violence: Not on file     Physical Exam   Vitals:   11/12/22 2010  BP: 134/87  Pulse: 80  Resp: 16  Temp: 98.1 F (36.7 C)  SpO2: 97%    CONSTITUTIONAL: Well-appearing, NAD NEURO/PSYCH:  Alert and oriented x 3, no focal deficits EYES:  eyes equal and reactive ENT/NECK:  no LAD, no JVD CARDIO: Regular rate, well-perfused, normal S1 and S2 PULM: Diffuse rhonchi, 5 word dyspnea GI/GU:  non-distended, non-tender MSK/SPINE:  No gross deformities, pitting edema to bilateral lower extremities SKIN:  no rash, atraumatic   *Additional and/or pertinent findings included in MDM below  Diagnostic and Interventional Summary    EKG Interpretation  Date/Time:  Wednesday November 12 2022 21:48:09 EST Ventricular Rate:  83 PR Interval:  181 QRS Duration: 99 QT Interval:  399 QTC Calculation: 469 R Axis:   35 Text Interpretation: Sinus rhythm Borderline T abnormalities, diffuse leads Confirmed by Gerlene Fee [id number]) on 11/13/2022 12:53:11 AM       Labs Reviewed  RESP PANEL BY RT-PCR (RSV, FLU A&B, COVID)  RVPGX2 - Abnormal; Notable for the following components:      Result Value   Influenza A by PCR POSITIVE (*)    Resp Syncytial Virus by PCR POSITIVE (*)    All other components within normal limits  CBC - Abnormal; Notable for the following components:   RBC 3.80 (*)    Hemoglobin 12.2 (*)    HCT 37.0 (*)    All other components within normal limits  BASIC METABOLIC PANEL - Abnormal; Notable for the following components:   Glucose, Bld 111 (*)     Creatinine, Ser 1.78 (*)    GFR, Estimated 38 (*)    All other components within normal limits  BRAIN NATRIURETIC PEPTIDE    CT Chest Wo Contrast  Final Result      Medications  oseltamivir (TAMIFLU) capsule 30 mg (has no administration in time range)  lactated ringers infusion (  has no administration in time range)  fentaNYL (SUBLIMAZE) injection 50 mcg (has no administration in time range)     Procedures  /  Critical Care .Critical Care  Performed by: Maudie Flakes, MD Authorized by: Maudie Flakes, MD   Critical care provider statement:    Critical care time (minutes):  34   Critical care was necessary to treat or prevent imminent or life-threatening deterioration of the following conditions:  Respiratory failure   Critical care was time spent personally by me on the following activities:  Development of treatment plan with patient or surrogate, discussions with consultants, evaluation of patient's response to treatment, examination of patient, ordering and review of laboratory studies, ordering and review of radiographic studies, ordering and performing treatments and interventions, pulse oximetry, re-evaluation of patient's condition and review of old charts   ED Course and Medical Decision Making  Initial Impression and Ddx Hypoxic respiratory failure requiring 4 L nasal cannula to maintain saturations in the low 90s.  5 or dyspnea on exam, diffuse rhonchi.  Otherwise reassuring vital signs, no chest pain, has some seemingly chronic lower extremity edema.  Patient has tested positive for both influenza A and RSV and so favoring these infections is the driving cause.  Remainder of workup is reassuring, no significant blood count or electrolyte disturbance, CT chest with some signs of infiltrate but no edema, BNP is normal.  Patient will be admitted.  Past medical/surgical history that increases complexity of ED encounter: Obstructive lung disease  Interpretation of  Diagnostics I personally reviewed the EKG and my interpretation is as follows: Sinus rhythm    Patient Reassessment and Ultimate Disposition/Management     Admission to hospital service.  Starting Tamiflu.  Patient management required discussion with the following services or consulting groups:  Hospitalist Service  Complexity of Problems Addressed Acute illness or injury that poses threat of life of bodily function  Additional Data Reviewed and Analyzed Further history obtained from: Prior labs/imaging results  Additional Factors Impacting ED Encounter Risk Consideration of hospitalization  Barth Kirks. Sedonia Small, MD [place] [email or web address]  Final Clinical Impressions(s) / ED Diagnoses     ICD-10-CM   1. Acute respiratory failure with hypoxia (HCC)  J96.01     2. Influenza A  J10.1     3. RSV (respiratory syncytial virus infection)  B33.8       ED Discharge Orders     None        Discharge Instructions Discussed with and Provided to Patient:   Discharge Instructions   None      Maudie Flakes, MD 11/13/22 [phone]

## 2022-11-13 NOTE — Progress Notes (Addendum)
PHARMACY NOTE:  ANTIMICROBIAL RENAL DOSAGE ADJUSTMENT  Current antimicrobial regimen includes a mismatch between antimicrobial dosage and estimated renal function.  As per policy approved by the Pharmacy & Therapeutics and Medical Executive Committees, the antimicrobial dosage will be adjusted accordingly.  Current antimicrobial dosage:  Tamiflu 30 mg daily   Indication: influ A (+)  Renal Function:  Estimated Creatinine Clearance: 40.9 mL/min (A) (by C-G formula based on SCr of 1.78 mg/dL (H)). '[]'$      On intermittent HD, scheduled: '[]'$      On CRRT    Antimicrobial dosage has been changed to:  Tamiflu 30 mg bid    Thank you for allowing pharmacy to be a part of this patient's care.  Lynelle Doctor, [facility] 11/13/2022 7:19 AM

## 2022-11-13 NOTE — Progress Notes (Deleted)
Synopsis: Referred in *** for ***  Subjective:   PATIENT ID: [name] GENDER: male DOB: 1941-06-04, MRN: [id number]   HPI  No chief complaint on file.   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   Hyperlipidemia    Joint pain    Neuromuscular disorder (HCC)    Pre-diabetes    Prediabetes    Sleep apnea    uses CPAP nightly   SOB (shortness of breath)    Swallowing difficulty    Swelling of lower extremity    right more than leg leg   Umbilical hernia      Family History  Problem Relation Age of Onset   Thyroid disease Mother    Heart disease Father    Hypertension Father    Diabetes Maternal Grandfather    Colon cancer Neg Hx    Esophageal cancer Neg Hx      Social History   Socioeconomic History   Marital status: Single    Spouse name: Not on file   Number of children: Not on file   Years of education: Not on file   Highest education level: Not on file  Occupational History   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  Education: Other.    Social Determinants of Health   Financial Resource Strain: Not on file  Food Insecurity: Not on file  Transportation Needs: Not on file  Physical Activity: Not on file  Stress: Not on file  Social Connections: Not on file  Intimate Partner Violence: Not on file     No Known Allergies   Facility-Administered Medications Prior to Visit  Medication Dose Route Frequency Provider Last Rate Last Admin   acetaminophen (TYLENOL) tablet 650 mg  650 mg Oral Q6H PRN Samella Parr, NP       Or   acetaminophen (TYLENOL) suppository 650 mg  650 mg Rectal Q6H PRN Samella Parr, NP       enoxaparin (LOVENOX) injection 40 mg  40 mg Subcutaneous Q24H Samella Parr, NP       lactated ringers infusion   Intravenous Continuous Maudie Flakes, MD 75 mL/hr at 11/13/22 0111 New Bag at 11/13/22 0111   ondansetron (ZOFRAN) tablet 4 mg  4 mg Oral Q6H PRN  Samella Parr, NP       Or   ondansetron [facility]) injection 4 mg  4 mg Intravenous Q6H PRN Samella Parr, NP       oseltamivir (TAMIFLU) capsule 30 mg  30 mg Oral BID Samella Parr, NP       sodium chloride flush (NS) 0.9 % injection 3 mL  3 mL Intravenous Q12H Samella Parr, NP       Outpatient Medications Prior to Visit  Medication Sig Dispense Refill   acetaminophen (TYLENOL) 325 MG tablet Take 1-2 tablets (325-650 mg total) by mouth every 6 (six) hours as needed for mild pain (pain score 1-3 or temp > 100.5). 40 tablet 0   albuterol (VENTOLIN HFA) 108 (90 Base) MCG/ACT inhaler Inhale 2 puffs into the lungs every 6 (six) hours as needed for wheezing or shortness of breath. 8 g 2   aspirin EC 325 MG tablet Take 1 tablet (325 mg total) by mouth 2 (two) times daily after a meal. Take until one month post op for DVT prophlaxis 60 tablet 0   azithromycin (ZITHROMAX) 250 MG tablet Take as directed 6 tablet 0   bisacodyl (DULCOLAX) 5 MG EC tablet Take 1 tablet (5 mg total) by mouth daily as needed for moderate  constipation. 30 tablet 0   budesonide-formoterol (SYMBICORT) 160-4.5 MCG/ACT inhaler Inhale 2 puffs into the lungs 2 (two) times daily. 1 each 11   buPROPion (WELLBUTRIN XL) 150 MG 24 hr tablet Take 450 mg by mouth every morning.     CALCIUM PO Take 1 tablet by mouth every morning.     cephALEXin (KEFLEX) 500 MG capsule Take 1 capsule (500 mg total) by mouth 3 (three) times daily for 10 days. 30 capsule 0   docusate sodium (COLACE) 100 MG capsule Take 1 capsule (100 mg total) by mouth 2 (two) times daily. 10 capsule 0   doxazosin (CARDURA) 8 MG tablet TAKE 1 TABLET BY MOUTH DAILY  . Take at night (Patient taking differently: Take 8 mg by mouth at bedtime.) 30 tablet 11   esomeprazole (NEXIUM) 20 MG capsule Take 20 mg by mouth every morning.     furosemide (LASIX) 40 MG tablet Take 40 mg by mouth daily.     gabapentin (NEURONTIN) 300 MG capsule TAKE 1 TO 2 CAPSULES BY MOUTH AT BEDTIME (Patient taking differently: Take 600 mg by mouth at bedtime.) 60 capsule 0   HYDROcodone-acetaminophen (NORCO) 5-325 MG tablet Take 1-2 tablets by mouth every 4 (four) hours as needed for moderate pain (Use sparingly). 40 tablet 0   methocarbamol (ROBAXIN) 500 MG tablet Take 1 tablet (500 mg total) by mouth every 8 (eight) hours as needed for muscle spasms. (Patient taking differently: Take 500 mg by mouth in the morning and at bedtime.) 20 tablet 0   mirtazapine (REMERON) 15 MG tablet Take 15 mg by mouth at bedtime.     montelukast (SINGULAIR) 10 MG tablet Take 1 tablet (10 mg total) by mouth at bedtime. 30 tablet 5   Multiple Vitamin (MULTIVITAMIN WITH MINERALS) TABS tablet Take 1 tablet by mouth every morning.     polyethylene glycol (MIRALAX / GLYCOLAX) 17 g packet Take 17 g by mouth daily as needed for mild constipation. 14 each 0   potassium chloride SA (K-DUR,KLOR-CON) 20 MEQ tablet Take 1 tablet (20 mEq total) by mouth 2 (two) times daily. 18 tablet 3   predniSONE (DELTASONE) 20 MG tablet Take '40mg'$  for 5 days  10  tablet 0   rosuvastatin (CRESTOR) 5 MG tablet Take 5 mg by mouth every morning.      ROS    Objective:  Physical Exam   There were no vitals filed for this visit.  ***  CBC    Component Value Date/Time   WBC 5.6 11/12/2022 2134   RBC 3.80 (L) 11/12/2022 2134   HGB 12.2 (L) 11/12/2022 2134   HCT 37.0 (L) 11/12/2022 2134   PLT 216 11/12/2022 2134   MCV 97.4 11/12/2022 2134   MCV 90.9 05/19/2016 1235   MCH 32.1 11/12/2022 2134   MCHC 33.0 11/12/2022 2134   RDW 14.6 11/12/2022 2134   LYMPHSABS 0.9 11/01/2022 2018   MONOABS 0.5 11/01/2022 2018   EOSABS 0.2 11/01/2022 2018   BASOSABS 0.0 11/01/2022 2018     Chest imaging:  PFT:  Labs:  Path:  Echo:  Heart Catheterization:       Assessment & Plan:   No diagnosis found.  Discussion: ***  Immunizations: Immunization History  Administered Date(s) Administered   Hepatitis A, Adult 07/31/2016   Influenza Split 09/28/2012, 09/10/2020, 08/12/2021   Influenza,inj,Quad PF,6+ Mos 08/03/2013, 07/21/2014, 09/13/2015, 07/23/2016, 07/14/2019   Moderna Covid-19 Vaccine Bivalent Booster 69yr & up 09/03/2021   Moderna Sars-Covid-2 Vaccination 11/21/2019, 12/19/2019, 08/24/2020, 05/04/2021   Pneumococcal Conjugate-13 03/25/2015   Pneumococcal Polysaccharide-23 04/29/2011, 06/28/2016   Tdap 04/25/2013    No current facility-administered medications for this visit.  Current Outpatient Medications:    acetaminophen (TYLENOL) 325 MG tablet, Take 1-2 tablets (325-650 mg total) by mouth every 6 (six) hours as needed for mild pain (pain score 1-3 or temp > 100.5)., Disp: 40 tablet, Rfl: 0   albuterol (VENTOLIN HFA) 108 (90 Base) MCG/ACT inhaler, Inhale 2 puffs into the lungs every 6 (six) hours as needed for wheezing or shortness of breath., Disp: 8 g, Rfl: 2   aspirin EC 325 MG tablet, Take 1 tablet (325 mg total) by mouth 2 (two) times daily after a meal. Take until one month post op for DVT prophlaxis, Disp: 60  tablet, Rfl: 0   azithromycin (ZITHROMAX) 250 MG tablet, Take as directed, Disp: 6 tablet, Rfl: 0   bisacodyl (DULCOLAX) 5 MG EC tablet, Take 1 tablet (5 mg total) by mouth daily as needed for moderate constipation., Disp: 30 tablet, Rfl: 0   budesonide-formoterol (SYMBICORT) 160-4.5 MCG/ACT inhaler, Inhale 2 puffs into the lungs 2 (two) times daily., Disp: 1 each, Rfl: 11   buPROPion (WELLBUTRIN XL) 150 MG 24 hr tablet, Take 450 mg by mouth every morning., Disp: , Rfl:    CALCIUM PO, Take 1 tablet by mouth every morning., Disp: , Rfl:    cephALEXin (KEFLEX) 500 MG capsule, Take 1 capsule (500 mg total) by mouth 3 (three) times daily for 10 days., Disp: 30 capsule, Rfl: 0   docusate sodium (COLACE) 100 MG capsule, Take 1 capsule (100 mg total) by mouth 2 (two) times daily., Disp: 10 capsule, Rfl: 0   doxazosin (CARDURA) 8 MG tablet, TAKE 1 TABLET BY MOUTH DAILY  . Take at night (Patient taking differently: Take 8 mg by mouth at bedtime.), Disp: 30 tablet, Rfl: 11   esomeprazole (NEXIUM) 20 MG capsule, Take 20 mg by mouth every morning., Disp: , Rfl:    furosemide (LASIX) 40 MG tablet, Take 40 mg by mouth daily., Disp: , Rfl:    gabapentin (NEURONTIN) 300 MG capsule, TAKE 1 TO 2 CAPSULES BY MOUTH AT BEDTIME (Patient taking differently: Take 600 mg by  mouth at bedtime.), Disp: 60 capsule, Rfl: 0   HYDROcodone-acetaminophen (NORCO) 5-325 MG tablet, Take 1-2 tablets by mouth every 4 (four) hours as needed for moderate pain (Use sparingly)., Disp: 40 tablet, Rfl: 0   methocarbamol (ROBAXIN) 500 MG tablet, Take 1 tablet (500 mg total) by mouth every 8 (eight) hours as needed for muscle spasms. (Patient taking differently: Take 500 mg by mouth in the morning and at bedtime.), Disp: 20 tablet, Rfl: 0   mirtazapine (REMERON) 15 MG tablet, Take 15 mg by mouth at bedtime., Disp: , Rfl:    montelukast (SINGULAIR) 10 MG tablet, Take 1 tablet (10 mg total) by mouth at bedtime., Disp: 30 tablet, Rfl: 5   Multiple  Vitamin (MULTIVITAMIN WITH MINERALS) TABS tablet, Take 1 tablet by mouth every morning., Disp: , Rfl:    polyethylene glycol (MIRALAX / GLYCOLAX) 17 g packet, Take 17 g by mouth daily as needed for mild constipation., Disp: 14 each, Rfl: 0   potassium chloride SA (K-DUR,KLOR-CON) 20 MEQ tablet, Take 1 tablet (20 mEq total) by mouth 2 (two) times daily., Disp: 18 tablet, Rfl: 3   predniSONE (DELTASONE) 20 MG tablet, Take '40mg'$  for 5 days, Disp: 10 tablet, Rfl: 0   rosuvastatin (CRESTOR) 5 MG tablet, Take 5 mg by mouth every morning., Disp: , Rfl:   Facility-Administered Medications Ordered in Other Visits:    acetaminophen (TYLENOL) tablet 650 mg, 650 mg, Oral, Q6H PRN **OR** acetaminophen (TYLENOL) suppository 650 mg, 650 mg, Rectal, Q6H PRN, Samella Parr, NP   enoxaparin (LOVENOX) injection 40 mg, 40 mg, Subcutaneous, Q24H, Samella Parr, NP   lactated ringers infusion, , Intravenous, Continuous, Maudie Flakes, MD, Last Rate: 75 mL/hr at 11/13/22 0111, New Bag at 11/13/22 0111   ondansetron (ZOFRAN) tablet 4 mg, 4 mg, Oral, Q6H PRN **OR** ondansetron (ZOFRAN) injection 4 mg, 4 mg, Intravenous, Q6H PRN, Samella Parr, NP   oseltamivir (TAMIFLU) capsule 30 mg, 30 mg, Oral, BID, Erin Hearing L, NP   sodium chloride flush (NS) 0.9 % injection 3 mL, 3 mL, Intravenous, Q12H, Samella Parr, NP

## 2022-11-14 DIAGNOSIS — J09X1 Influenza due to identified novel influenza A virus with pneumonia: Secondary | ICD-10-CM | POA: Diagnosis not present

## 2022-11-14 LAB — CBC
HCT: 33.1 % — ABNORMAL LOW (ref 39.0–52.0)
Hemoglobin: 10.8 g/dL — ABNORMAL LOW (ref 13.0–17.0)
MCH: 32.4 pg (ref 26.0–34.0)
MCHC: 32.6 g/dL (ref 30.0–36.0)
MCV: 99.4 fL (ref 80.0–100.0)
Platelets: 176 10*3/uL (ref 150–400)
RBC: 3.33 MIL/uL — ABNORMAL LOW (ref 4.22–5.81)
RDW: 14.3 % (ref 11.5–15.5)
WBC: 7.3 10*3/uL (ref 4.0–10.5)
nRBC: 0 % (ref 0.0–0.2)

## 2022-11-14 LAB — BASIC METABOLIC PANEL
Anion gap: 10 (ref 5–15)
BUN: 24 mg/dL — ABNORMAL HIGH (ref 8–23)
CO2: 27 mmol/L (ref 22–32)
Calcium: 8.5 mg/dL — ABNORMAL LOW (ref 8.9–10.3)
Chloride: 101 mmol/L (ref 98–111)
Creatinine, Ser: 1.72 mg/dL — ABNORMAL HIGH (ref 0.61–1.24)
GFR, Estimated: 39 mL/min — ABNORMAL LOW (ref >60)
Glucose, Bld: 160 mg/dL — ABNORMAL HIGH (ref 70–99)
Potassium: 4 mmol/L (ref 3.5–5.1)
Sodium: 138 mmol/L (ref 135–145)

## 2022-11-14 MED ORDER — FUROSEMIDE 40 MG PO TABS
40.0000 mg | ORAL_TABLET | Freq: Every day | ORAL | Status: DC
  Administered 2022-11-14 – 2022-11-23 (×10): 40 mg via ORAL
  Filled 2022-11-14 (×10): qty 1

## 2022-11-14 MED ORDER — FUROSEMIDE 40 MG PO TABS: 40.0000 mg | ORAL_TABLET | ORAL | Status: DC

## 2022-11-14 MED ORDER — SODIUM CHLORIDE 3 % IN NEBU
4.0000 mL | INHALATION_SOLUTION | Freq: Two times a day (BID) | RESPIRATORY_TRACT | Status: AC
  Administered 2022-11-14 – 2022-11-17 (×6): 4 mL via RESPIRATORY_TRACT
  Filled 2022-11-14 (×6): qty 4

## 2022-11-14 MED ORDER — METHYLPREDNISOLONE SODIUM SUCC 125 MG IJ SOLR
80.0000 mg | Freq: Two times a day (BID) | INTRAMUSCULAR | Status: DC
  Administered 2022-11-14 – 2022-11-18 (×8): 80 mg via INTRAVENOUS
  Filled 2022-11-14 (×9): qty 2

## 2022-11-14 MED ORDER — FLUTICASONE PROPIONATE 50 MCG/ACT NA SUSP
2.0000 | Freq: Every day | NASAL | Status: DC
  Administered 2022-11-14 – 2022-11-21 (×7): 2 via NASAL
  Filled 2022-11-14: qty 16

## 2022-11-14 MED ORDER — POTASSIUM CHLORIDE CRYS ER 20 MEQ PO TBCR
20.0000 meq | EXTENDED_RELEASE_TABLET | Freq: Two times a day (BID) | ORAL | Status: DC
  Administered 2022-11-14 – 2022-11-23 (×19): 20 meq via ORAL
  Filled 2022-11-14 (×19): qty 1

## 2022-11-14 MED ORDER — PANTOPRAZOLE SODIUM 20 MG PO TBEC
20.0000 mg | DELAYED_RELEASE_TABLET | Freq: Every day | ORAL | Status: DC
  Administered 2022-11-14 – 2022-11-16 (×3): 20 mg via ORAL
  Filled 2022-11-14 (×4): qty 1

## 2022-11-14 MED ORDER — GUAIFENESIN ER 600 MG PO TB12
600.0000 mg | ORAL_TABLET | Freq: Two times a day (BID) | ORAL | Status: DC
  Administered 2022-11-14 – 2022-11-16 (×5): 600 mg via ORAL
  Filled 2022-11-14 (×5): qty 1

## 2022-11-14 MED ORDER — FUROSEMIDE 40 MG PO TABS: 40.0000 mg | ORAL_TABLET | Freq: Every day | ORAL | Status: DC | PRN

## 2022-11-14 MED ORDER — GUAIFENESIN-DM 100-10 MG/5ML PO SYRP
5.0000 mL | ORAL_SOLUTION | ORAL | Status: DC | PRN
  Administered 2022-11-14 – 2022-11-20 (×2): 5 mL via ORAL
  Filled 2022-11-14 (×2): qty 5

## 2022-11-14 MED ORDER — PHENOL 1.4 % MT LIQD
1.0000 | OROMUCOSAL | Status: DC | PRN
  Filled 2022-11-14: qty 177

## 2022-11-14 MED ORDER — DOXAZOSIN MESYLATE 8 MG PO TABS
8.0000 mg | ORAL_TABLET | Freq: Every day | ORAL | Status: DC
  Administered 2022-11-14 – 2022-11-22 (×9): 8 mg via ORAL
  Filled 2022-11-14 (×11): qty 1

## 2022-11-14 NOTE — Progress Notes (Signed)
  Transition of Care [facility]) Screening Note   Patient Details  Name: [name] Date of Birth: [birth year]   Transition of Care [facility]) CM/SW Contact:    Vassie Moselle, [place] Phone Number: 11/14/2022, 1:18 PM    Transition of Care Department [facility]) has reviewed patient and no TOC needs have been identified at this time. We will continue to monitor patient advancement through interdisciplinary progression rounds. If new patient transition needs arise, please place a TOC consult.

## 2022-11-14 NOTE — Progress Notes (Signed)
PROGRESS NOTE    [name]  GEX:[id number] DOB: 1940-12-05 DOA: 11/12/2022 PCP: Cari Caraway, MD     Brief Narrative:   H/o asthma not on oxygen. 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 Patient has been having issues with recurrent respiratory symptoms since the beginning of December. He was initially treated with a Z-Pak starting on 12/6 by his pulmonologist. Later steroids were added by his PCP. He never really got fully better and over the past 2 days has had increasing respiratory symptoms to the point he was having significant dyspnea at rest and on exertion. He presented to urgent care where he was found to be hypoxemic. He was also found to be positive for both influenza A and RSV   Subjective:  He does not think he is getting any better, he is currently on 4liter oxygen, no hypoxia, able to talk in full sentences   Assessment & Plan:  Principal Problem:   Influenza A with pneumonia    Assessment and Plan:   Acute respiratory failure with hypoxia secondary to influenza A and RSV infection associated acute bronchitis, h/o asthma , not on home oxygen -Persistent bilateral rhonchi, he does not feel he is improving, will increase steroids, add hypertonic saline, continue Supportive care with oxygen, Duonebs, budesonide nebs, and low-dose systemic steroids, Continue Tamiflu-flonase      OSA on CPAP CPAP with O2 bleed in order for acute hospital stay given hypoxemia Requested nasal mask as per patient's usual regimen Gust with patient that given his nasal congestion he may need to try a fullface mask.  He is aware and will at least attempt to do that if it is indicated   Stage IIIb chronic kidney disease Function stable and at baseline of creatinine 1.7-1.8 with a GFR in the 30s Avoid any nephrotoxic medications    Pulmonary nodules Radiologist recommends for high risk patients follow-up imaging with noncontrast CT in 12 months   Prediabetes Follow serum glucose in context of receiving IV steroids-if glucose remains significantly elevated may need to follow CBGs and provide SSI   HLD Continue Crestor   GERD Continue PPI    Body mass index is 37.83 kg/m..meet obesity criteria      I have Reviewed nursing notes, Vitals, pain scores, I/o's, Lab results and  imaging results since pt's last encounter, details please see discussion above  I ordered the following labs:  Unresulted Labs (From admission, onward)     Start     Ordered   11/20/22 0500  Creatinine, serum  (enoxaparin (LOVENOX)    CrCl >/= 30 ml/min)  Weekly,   R     Comments: while on enoxaparin therapy    11/13/22 0708   11/15/22 0500  Procalcitonin  Daily,   R     Question:  Specimen collection method  Answer:  Lab=Lab collect   11/14/22 1907   11/15/22 0500  CBC with Differential/Platelet  Tomorrow morning,   R       Question:  Specimen collection method  Answer:  Lab=Lab collect   11/14/22 2247   11/15/22 0102  Basic metabolic panel  Daily,   R     Question:  Specimen collection method  Answer:  Lab=Lab collect   11/14/22 2247   11/15/22 0500  Magnesium  Tomorrow morning,   R       Question:  Specimen  collection method  Answer:  Lab=Lab collect   11/14/22 2247   11/15/22 0500  Phosphorus  Tomorrow morning,   R       Question:  Specimen collection method  Answer:  Lab=Lab collect   11/14/22 2248             DVT prophylaxis: enoxaparin (LOVENOX) injection 40 mg Start: 11/13/22 1000   Code Status:   Code Status: Full Code  Family Communication: none at bedside  Disposition:   Dispo: The patient is from: home              Anticipated d/c is to: home              Anticipated d/c date is: TBD  Antimicrobials:    Anti-infectives (From admission, onward)    Start     Dose/Rate Route Frequency Ordered Stop    11/13/22 1000  oseltamivir (TAMIFLU) capsule 30 mg  Status:  Discontinued        30 mg Oral Daily 11/13/22 0708 11/13/22 0723   11/13/22 1000  oseltamivir (TAMIFLU) capsule 30 mg        30 mg Oral 2 times daily 11/13/22 0723 11/18/22 0959   11/13/22 0045  oseltamivir (TAMIFLU) capsule 30 mg  Status:  Discontinued        30 mg Oral 2 times daily 11/13/22 0038 11/13/22 0712          Objective: Vitals:   11/14/22 1338 11/14/22 1604 11/14/22 2003 11/14/22 2108  BP:   (!) 140/79   Pulse:   88   Resp:   20   Temp:   99.5 F (37.5 C)   TempSrc:   Oral   SpO2: 94%  97% 94%  Weight:  116.2 kg    Height:  '5\' 9"'$  (1.753 m)      Intake/Output Summary (Last 24 hours) at 11/14/2022 2248 Last data filed at 11/14/2022 0519 Gross per 24 hour  Intake --  Output 450 ml  Net -450 ml   Filed Weights   11/14/22 1604  Weight: 116.2 kg    Examination:  General exam: alert, awake, communicative,calm, NAD Respiratory system: bilateral diffuse rhonchi. Respiratory effort normal. Cardiovascular system:  RRR.  Gastrointestinal system: Abdomen is nondistended, soft and nontender.  Normal bowel sounds heard. Central nervous system: Alert and oriented. No focal neurological deficits. Extremities:  chronic bilateral lower extremity pitting edema Skin: No rashes, lesions or ulcers Psychiatry: Judgement and insight appear normal. Mood & affect appropriate.     Data Reviewed: I have personally reviewed  labs and visualized  imaging studies since the last encounter and formulate the plan        Scheduled Meds:  budesonide  0.5 mg Nebulization BID   buPROPion  450 mg Oral q morning   docusate sodium  100 mg Oral BID   doxazosin  8 mg Oral QHS   enoxaparin (LOVENOX) injection  40 mg Subcutaneous Q24H   fluticasone  2 spray Each Nare Daily   furosemide  40 mg Oral Daily   gabapentin  600 mg Oral QHS   guaiFENesin  600 mg Oral BID   ipratropium  2 spray Each Nare QHS   ipratropium-albuterol   3 mL Nebulization TID   methocarbamol  500 mg Oral BID   methylPREDNISolone (SOLU-MEDROL) injection  80 mg Intravenous Q12H   mirtazapine  15 mg Oral QHS   montelukast  10 mg Oral QHS   multivitamin with minerals  1 tablet Oral  q morning   oseltamivir  30 mg Oral BID   oxymetazoline  1 spray Each Nare BID   pantoprazole  20 mg Oral QHS   potassium chloride SA  20 mEq Oral BID   rosuvastatin  5 mg Oral q morning   sodium chloride flush  3 mL Intravenous Q12H   sodium chloride HYPERTONIC  4 mL Nebulization BID   Continuous Infusions:   LOS: 1 day   Time spent: 26mns  FFlorencia Reasons MD PhD FACP [facility]  Available via Epic secure chat 7am-7pm for nonurgent issues Please page for urgent issues To page the attending provider between 7A-7P or the covering provider during after hours 7P-7A, please log into the web site [email or web address] and access using universal [place] password for that web site. If you do not have the password, please call the hospital operator.    11/14/2022, 10:48 PM

## 2022-11-15 ENCOUNTER — Inpatient Hospital Stay (HOSPITAL_COMMUNITY): Payer: Medicare Other

## 2022-11-15 DIAGNOSIS — J09X1 Influenza due to identified novel influenza A virus with pneumonia: Secondary | ICD-10-CM | POA: Diagnosis not present

## 2022-11-15 LAB — CBC WITH DIFFERENTIAL/PLATELET
Abs Immature Granulocytes: 0.14 10*3/uL — ABNORMAL HIGH (ref 0.00–0.07)
Basophils Absolute: 0 10*3/uL (ref 0.0–0.1)
Basophils Relative: 0 %
Eosinophils Absolute: 0 10*3/uL (ref 0.0–0.5)
Eosinophils Relative: 0 %
HCT: 32.8 % — ABNORMAL LOW (ref 39.0–52.0)
Hemoglobin: 10.9 g/dL — ABNORMAL LOW (ref 13.0–17.0)
Immature Granulocytes: 2 %
Lymphocytes Relative: 7 %
Lymphs Abs: 0.6 10*3/uL — ABNORMAL LOW (ref 0.7–4.0)
MCH: 33.1 pg (ref 26.0–34.0)
MCHC: 33.2 g/dL (ref 30.0–36.0)
MCV: 99.7 fL (ref 80.0–100.0)
Monocytes Absolute: 0.2 10*3/uL (ref 0.1–1.0)
Monocytes Relative: 3 %
Neutro Abs: 7.6 10*3/uL (ref 1.7–7.7)
Neutrophils Relative %: 88 %
Platelets: 193 10*3/uL (ref 150–400)
RBC: 3.29 MIL/uL — ABNORMAL LOW (ref 4.22–5.81)
RDW: 14.4 % (ref 11.5–15.5)
WBC: 8.6 10*3/uL (ref 4.0–10.5)
nRBC: 0 % (ref 0.0–0.2)

## 2022-11-15 LAB — BASIC METABOLIC PANEL
Anion gap: 11 (ref 5–15)
BUN: 38 mg/dL — ABNORMAL HIGH (ref 8–23)
CO2: 28 mmol/L (ref 22–32)
Calcium: 8.6 mg/dL — ABNORMAL LOW (ref 8.9–10.3)
Chloride: 102 mmol/L (ref 98–111)
Creatinine, Ser: 1.81 mg/dL — ABNORMAL HIGH (ref 0.61–1.24)
GFR, Estimated: 37 mL/min — ABNORMAL LOW (ref >60)
Glucose, Bld: 172 mg/dL — ABNORMAL HIGH (ref 70–99)
Potassium: 4.2 mmol/L (ref 3.5–5.1)
Sodium: 141 mmol/L (ref 135–145)

## 2022-11-15 LAB — PROCALCITONIN: Procalcitonin: <0.1 ng/mL

## 2022-11-15 LAB — MAGNESIUM: Magnesium: 2.2 mg/dL (ref 1.7–2.4)

## 2022-11-15 LAB — PHOSPHORUS: Phosphorus: 4.1 mg/dL (ref 2.5–4.6)

## 2022-11-15 NOTE — Progress Notes (Signed)
PROGRESS NOTE    [name]  GQB:[id number] DOB: Dec 29, 1940 DOA: 11/12/2022 PCP: Cari Caraway, MD     Brief Narrative:   H/o asthma not on oxygen. 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 Patient has been having issues with recurrent respiratory symptoms since the beginning of December. He was initially treated with a Z-Pak starting on 12/6 by his pulmonologist. Later steroids were added by his PCP. He never really got fully better and over the past 2 days has had increasing respiratory symptoms to the point he was having significant dyspnea at rest and on exertion. He presented to urgent care where he was found to be hypoxemic. He was also found to be positive for both influenza A and RSV   Subjective:  He reports is feeling better today, he thinks both nebs helped him, he is currently on 4liter oxygen, no hypoxia, able to talk in full sentences   Assessment & Plan:  Principal Problem:   Influenza A with pneumonia    Assessment and Plan:   Acute respiratory failure with hypoxia secondary to influenza A and RSV infection associated acute bronchitis, h/o asthma , not on home oxygen -appear slightly better after increasing steroids,and add hypertonic saline to duoneb, mucinex, Continue Tamiflu -continue to have significant rhonchi bilaterally, repeat cxr,  procalcitonin< 0.1, hold off on antibacterial for now     OSA on CPAP CPAP with O2 bleed in order for acute hospital stay given hypoxemia Requested nasal mask as per patient's usual regimen Gust with patient that given his nasal congestion he may need to try a fullface mask.  He is aware and will at least attempt to do that if it is indicated   Stage IIIb chronic kidney disease Function stable and at baseline of creatinine 1.7-1.8 with a GFR in the 30s Avoid any  nephrotoxic medications   Pulmonary nodules Radiologist recommends for high risk patients follow-up imaging with noncontrast CT in 12 months   Prediabetes Follow serum glucose in context of receiving IV steroids-if glucose remains significantly elevated may need to follow CBGs and provide SSI   HLD Continue Crestor   GERD Continue PPI    Body mass index is 37.83 kg/m..meet obesity criteria      I have Reviewed nursing notes, Vitals, pain scores, I/o's, Lab results and  imaging results since pt's last encounter, details please see discussion above  I ordered the following labs:  Unresulted Labs (From admission, onward)     Start     Ordered   11/20/22 0500  Creatinine, serum  (enoxaparin (LOVENOX)    CrCl >/= 30 ml/min)  Weekly,   R     Comments: while on enoxaparin therapy    11/13/22 0708   11/15/22 0500  Procalcitonin  Daily,   R     Question:  Specimen collection method  Answer:  Lab=Lab collect   11/14/22 1907   11/15/22 8280  Basic metabolic panel  Daily,   R     Question:  Specimen collection method  Answer:  Lab=Lab collect   11/14/22 2247             DVT prophylaxis: enoxaparin (LOVENOX) injection 40 mg Start: 11/13/22 1000   Code Status:   Code Status: Full Code  Family Communication: none at bedside  Disposition:   Dispo: The patient is from: home  Anticipated d/c is to: home              Anticipated d/c date is: TBD  Antimicrobials:    Anti-infectives (From admission, onward)    Start     Dose/Rate Route Frequency Ordered Stop   11/13/22 1000  oseltamivir (TAMIFLU) capsule 30 mg  Status:  Discontinued        30 mg Oral Daily 11/13/22 0708 11/13/22 0723   11/13/22 1000  oseltamivir (TAMIFLU) capsule 30 mg        30 mg Oral 2 times daily 11/13/22 0723 11/18/22 0959   11/13/22 0045  oseltamivir (TAMIFLU) capsule 30 mg  Status:  Discontinued        30 mg Oral 2 times daily 11/13/22 0038 11/13/22 0712           Objective: Vitals:   11/14/22 2108 11/15/22 0431 11/15/22 0814 11/15/22 1503  BP:  (!) 154/73  (!) 148/75  Pulse:  (!) 56  66  Resp:  18  16  Temp:  97.7 F (36.5 C)    TempSrc:  Oral  Oral  SpO2: 94% 100% 100% 100%  Weight:      Height:        Intake/Output Summary (Last 24 hours) at 11/15/2022 1556 Last data filed at 11/15/2022 0500 Gross per 24 hour  Intake --  Output 800 ml  Net -800 ml   Filed Weights   11/14/22 1604  Weight: 116.2 kg    Examination:  General exam: alert, awake, communicative,calm, NAD Respiratory system: bilateral diffuse rhonchi. Respiratory effort normal. Cardiovascular system:  RRR.  Gastrointestinal system: Abdomen is nondistended, soft and nontender.  Normal bowel sounds heard. Central nervous system: Alert and oriented. No focal neurological deficits. Extremities:  chronic bilateral lower extremity pitting edema Skin: No rashes, lesions or ulcers Psychiatry: Judgement and insight appear normal. Mood & affect appropriate.     Data Reviewed: I have personally reviewed  labs and visualized  imaging studies since the last encounter and formulate the plan        Scheduled Meds:  budesonide  0.5 mg Nebulization BID   buPROPion  450 mg Oral q morning   docusate sodium  100 mg Oral BID   doxazosin  8 mg Oral QHS   enoxaparin (LOVENOX) injection  40 mg Subcutaneous Q24H   fluticasone  2 spray Each Nare Daily   furosemide  40 mg Oral Daily   gabapentin  600 mg Oral QHS   guaiFENesin  600 mg Oral BID   ipratropium  2 spray Each Nare QHS   ipratropium-albuterol  3 mL Nebulization TID   methocarbamol  500 mg Oral BID   methylPREDNISolone (SOLU-MEDROL) injection  80 mg Intravenous Q12H   mirtazapine  15 mg Oral QHS   montelukast  10 mg Oral QHS   multivitamin with minerals  1 tablet Oral q morning   oseltamivir  30 mg Oral BID   oxymetazoline  1 spray Each Nare BID   pantoprazole  20 mg Oral QHS   potassium chloride SA  20 mEq  Oral BID   rosuvastatin  5 mg Oral q morning   sodium chloride flush  3 mL Intravenous Q12H   sodium chloride HYPERTONIC  4 mL Nebulization BID   Continuous Infusions:   LOS: 2 days   Time spent: 83mns  FFlorencia Reasons MD PhD FACP [facility]  Available via Epic secure chat 7am-7pm for nonurgent issues Please page for urgent issues To page the  attending provider between 7A-7P or the covering provider during after hours 7P-7A, please log into the web site [email or web address] and access using universal [place] password for that web site. If you do not have the password, please call the hospital operator.    11/15/2022, 3:56 PM

## 2022-11-16 DIAGNOSIS — J09X1 Influenza due to identified novel influenza A virus with pneumonia: Secondary | ICD-10-CM | POA: Diagnosis not present

## 2022-11-16 LAB — BASIC METABOLIC PANEL
Anion gap: 9 (ref 5–15)
BUN: 37 mg/dL — ABNORMAL HIGH (ref 8–23)
CO2: 27 mmol/L (ref 22–32)
Calcium: 8.7 mg/dL — ABNORMAL LOW (ref 8.9–10.3)
Chloride: 104 mmol/L (ref 98–111)
Creatinine, Ser: 1.63 mg/dL — ABNORMAL HIGH (ref 0.61–1.24)
GFR, Estimated: 42 mL/min — ABNORMAL LOW (ref >60)
Glucose, Bld: 173 mg/dL — ABNORMAL HIGH (ref 70–99)
Potassium: 4 mmol/L (ref 3.5–5.1)
Sodium: 140 mmol/L (ref 135–145)

## 2022-11-16 LAB — PROCALCITONIN: Procalcitonin: <0.1 ng/mL

## 2022-11-16 NOTE — Progress Notes (Signed)
PROGRESS NOTE    [name]  PNT:[id number] DOB: 09-30-1941 DOA: 11/12/2022 PCP: Cari Caraway, MD     Brief Narrative:   H/o asthma not on oxygen. 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 Patient has been having issues with recurrent respiratory symptoms since the beginning of December. He was initially treated with a Z-Pak starting on 12/6 by his pulmonologist. Later steroids were added by his PCP. He never really got fully better and over the past 2 days has had increasing respiratory symptoms to the point he was having significant dyspnea at rest and on exertion. He presented to urgent care where he was found to be hypoxemic. He was also found to be positive for both influenza A and RSV   Subjective:  Still very congested, cough is minimal productive with clear phlegm , he is currently on 4liter oxygen, no hypoxia, able to talk in full sentences   Assessment & Plan:  Principal Problem:   Influenza A with pneumonia    Assessment and Plan:   Acute respiratory failure with hypoxia secondary to influenza A and RSV infection associated acute bronchitis, h/o asthma , not on home oxygen -remain congested and bilateral rhonchi, continue current iv steroids, hypertonic saline to duoneb, mucinex, Continue Tamiflu, add chest PT Cxr "Bibasilar infiltrates are stable on the lateral view and slightly increase on the left on the frontal view" - procalcitonin< 0.1, hold off on antibacterial for now     OSA on CPAP CPAP with O2 bleed in order for acute hospital stay given hypoxemia Requested nasal mask as per patient's usual regimen Gust with patient that given his nasal congestion he may need to try a fullface mask.  He is aware and will at least attempt to do that if it is indicated   Stage IIIb chronic kidney disease Function stable  and at baseline of creatinine 1.7-1.8 with a GFR in the 30s Avoid any nephrotoxic medications   Pulmonary nodules Radiologist recommends for high risk patients follow-up imaging with noncontrast CT in 12 months   Prediabetes Follow serum glucose in context of receiving IV steroids-if glucose remains significantly elevated may need to follow CBGs and provide SSI   HLD Continue Crestor   GERD Continue PPI    Body mass index is 37.83 kg/m..meet obesity criteria      I have Reviewed nursing notes, Vitals, pain scores, I/o's, Lab results and  imaging results since pt's last encounter, details please see discussion above  I ordered the following labs:  Unresulted Labs (From admission, onward)     Start     Ordered   11/20/22 0500  Creatinine, serum  (enoxaparin (LOVENOX)    CrCl >/= 30 ml/min)  Weekly,   R     Comments: while on enoxaparin therapy    11/13/22 0708   11/16/22 1857  Strep pneumoniae urinary antigen  Once,   R        11/16/22 1856   11/15/22 0867  Basic metabolic panel  Daily,   R     Question:  Specimen collection method  Answer:  Lab=Lab collect   11/14/22 2247             DVT prophylaxis: enoxaparin (LOVENOX) injection 40 mg Start: 11/13/22 1000   Code Status:   Code Status: Full Code  Family Communication: none at bedside  Disposition:   Dispo:  The patient is from: home              Anticipated d/c is to: home              Anticipated d/c date is: TBD  Antimicrobials:    Anti-infectives (From admission, onward)    Start     Dose/Rate Route Frequency Ordered Stop   11/13/22 1000  oseltamivir (TAMIFLU) capsule 30 mg  Status:  Discontinued        30 mg Oral Daily 11/13/22 0708 11/13/22 0723   11/13/22 1000  oseltamivir (TAMIFLU) capsule 30 mg        30 mg Oral 2 times daily 11/13/22 0723 11/18/22 0959   11/13/22 0045  oseltamivir (TAMIFLU) capsule 30 mg  Status:  Discontinued        30 mg Oral 2 times daily 11/13/22 0038 11/13/22 0712           Objective: Vitals:   11/16/22 0357 11/16/22 0948 11/16/22 1413 11/16/22 1531  BP: (!) 169/74  (!) 135/56   Pulse: (!) 56  76   Resp: 20  (!) 24   Temp: 97.6 F (36.4 C)  97.7 F (36.5 C)   TempSrc: Oral  Oral   SpO2: 99% 95% 96% 98%  Weight:      Height:        Intake/Output Summary (Last 24 hours) at 11/16/2022 1858 Last data filed at 11/16/2022 1459 Gross per 24 hour  Intake 1659 ml  Output 2200 ml  Net -541 ml   Filed Weights   11/14/22 1604  Weight: 116.2 kg    Examination:  General exam: alert, awake, communicative,calm, NAD Respiratory system: bilateral diffuse rhonchi. Respiratory effort normal. Cardiovascular system:  RRR.  Gastrointestinal system: Abdomen is nondistended, soft and nontender.  Normal bowel sounds heard. Central nervous system: Alert and oriented. No focal neurological deficits. Extremities:  chronic bilateral lower extremity pitting edema Skin: No rashes, lesions or ulcers Psychiatry: Judgement and insight appear normal. Mood & affect appropriate.     Data Reviewed: I have personally reviewed  labs and visualized  imaging studies since the last encounter and formulate the plan        Scheduled Meds:  budesonide  0.5 mg Nebulization BID   buPROPion  450 mg Oral q morning   docusate sodium  100 mg Oral BID   doxazosin  8 mg Oral QHS   enoxaparin (LOVENOX) injection  40 mg Subcutaneous Q24H   fluticasone  2 spray Each Nare Daily   furosemide  40 mg Oral Daily   gabapentin  600 mg Oral QHS   guaiFENesin  600 mg Oral BID   ipratropium  2 spray Each Nare QHS   ipratropium-albuterol  3 mL Nebulization TID   methocarbamol  500 mg Oral BID   methylPREDNISolone (SOLU-MEDROL) injection  80 mg Intravenous Q12H   mirtazapine  15 mg Oral QHS   montelukast  10 mg Oral QHS   multivitamin with minerals  1 tablet Oral q morning   oseltamivir  30 mg Oral BID   pantoprazole  20 mg Oral QHS   potassium chloride SA  20 mEq Oral BID    rosuvastatin  5 mg Oral q morning   sodium chloride flush  3 mL Intravenous Q12H   sodium chloride HYPERTONIC  4 mL Nebulization BID   Continuous Infusions:   LOS: 3 days   Time spent: 61mns  FFlorencia Reasons MD PhD FACP [facility]  Available via Epic  secure chat 7am-7pm for nonurgent issues Please page for urgent issues To page the attending provider between 7A-7P or the covering provider during after hours 7P-7A, please log into the web site [email or web address] and access using universal [place] password for that web site. If you do not have the password, please call the hospital operator.    11/16/2022, 6:58 PM

## 2022-11-16 NOTE — Progress Notes (Signed)
Patient had attitude to RN when he called for assistant. He requested Anip NT to come and do IV dressing change for him. RN educated patient as this job will be done by this RN and Anip could not do that. Patient raised his voice and pushed RN out when this RN tried to fixed his IV.

## 2022-11-16 NOTE — Plan of Care (Signed)

## 2022-11-17 DIAGNOSIS — J09X1 Influenza due to identified novel influenza A virus with pneumonia: Secondary | ICD-10-CM | POA: Diagnosis not present

## 2022-11-17 LAB — BASIC METABOLIC PANEL
Anion gap: 9 (ref 5–15)
BUN: 38 mg/dL — ABNORMAL HIGH (ref 8–23)
CO2: 27 mmol/L (ref 22–32)
Calcium: 8.5 mg/dL — ABNORMAL LOW (ref 8.9–10.3)
Chloride: 103 mmol/L (ref 98–111)
Creatinine, Ser: 1.64 mg/dL — ABNORMAL HIGH (ref 0.61–1.24)
GFR, Estimated: 42 mL/min — ABNORMAL LOW (ref >60)
Glucose, Bld: 175 mg/dL — ABNORMAL HIGH (ref 70–99)
Potassium: 3.9 mmol/L (ref 3.5–5.1)
Sodium: 139 mmol/L (ref 135–145)

## 2022-11-17 LAB — STREP PNEUMONIAE URINARY ANTIGEN: Strep Pneumo Urinary Antigen: NEGATIVE

## 2022-11-17 MED ORDER — PANTOPRAZOLE SODIUM 20 MG PO TBEC
20.0000 mg | DELAYED_RELEASE_TABLET | Freq: Two times a day (BID) | ORAL | Status: DC
  Administered 2022-11-17 – 2022-11-23 (×13): 20 mg via ORAL
  Filled 2022-11-17 (×14): qty 1

## 2022-11-17 MED ORDER — AZITHROMYCIN 250 MG PO TABS
500.0000 mg | ORAL_TABLET | Freq: Every day | ORAL | Status: AC
  Administered 2022-11-17 – 2022-11-21 (×5): 500 mg via ORAL
  Filled 2022-11-17 (×5): qty 2

## 2022-11-17 MED ORDER — GUAIFENESIN ER 600 MG PO TB12
1200.0000 mg | ORAL_TABLET | Freq: Two times a day (BID) | ORAL | Status: DC
  Administered 2022-11-17 – 2022-11-23 (×13): 1200 mg via ORAL
  Filled 2022-11-17 (×13): qty 2

## 2022-11-17 MED ORDER — SACCHAROMYCES BOULARDII 250 MG PO CAPS
250.0000 mg | ORAL_CAPSULE | Freq: Two times a day (BID) | ORAL | Status: DC
  Administered 2022-11-17 – 2022-11-23 (×13): 250 mg via ORAL
  Filled 2022-11-17 (×13): qty 1

## 2022-11-17 MED ORDER — SODIUM CHLORIDE 0.9 % IV SOLN
1.0000 g | INTRAVENOUS | Status: DC
  Administered 2022-11-17 – 2022-11-20 (×4): 1 g via INTRAVENOUS
  Filled 2022-11-17 (×5): qty 10

## 2022-11-17 NOTE — Progress Notes (Signed)
PT wants to self manage Cpap

## 2022-11-17 NOTE — Progress Notes (Signed)
PT demonstrated hands on understanding of Flutter device. Small productive cough per PT.

## 2022-11-17 NOTE — Progress Notes (Signed)
PT continues to demonstrate hands on understanding of Flutter device. PC per PT but not at this time.

## 2022-11-17 NOTE — Progress Notes (Addendum)
PROGRESS NOTE    [name]  GYK:[id number] DOB: [birth year] DOA: 11/12/2022 PCP: Cari Caraway, MD     Brief Narrative:   H/o asthma not on oxygen. 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 Patient has been having issues with recurrent respiratory symptoms since the beginning of December. He was initially treated with a Z-Pak starting on 12/6 by his pulmonologist. Later steroids were added by his PCP. He never really got fully better and over the past 2 days has had increasing respiratory symptoms to the point he was having significant dyspnea at rest and on exertion. He presented to urgent care where he was found to be hypoxemic. He was also found to be positive for both influenza A and RSV   Subjective:  Still very congested, cough started to be more productive per RN  he is currently on 4liter oxygen, no hypoxia, able to talk in full sentences   Reports acid reflux symptoms  Reports brown stool but blood in toilet, denies ab pain Urine is clear  Assessment & Plan:  Principal Problem:   Influenza A with pneumonia    Assessment and Plan:   Acute respiratory failure with hypoxia secondary to influenza A and RSV infection associated acute bronchitis, h/o asthma , not on home oxygen -Urine strep pneumo negative, procalcitonin< 0.1, -Cxr "Bibasilar infiltrates are stable on the lateral view and slightly increase on the left on the frontal view" - continue current iv steroids, hypertonic saline to duoneb, mucinex, Tamiflu,  chest PT,  --Started empiric antibiotic Rocephin and Zithromax on 1/8 due to lack of  significant clinical improvement -cough started to become productive, bilateral rhonchi slightly improved     OSA on CPAP CPAP with O2 bleed in order for acute hospital stay given hypoxemia Requested nasal mask as per  patient's usual regimen Gust with patient that given his nasal congestion he may need to try a fullface mask.  He is aware and will at least attempt to do that if it is indicated   Stage IIIb chronic kidney disease Function stable and at baseline of creatinine 1.7-1.8 with a GFR in the 30s Avoid any nephrotoxic medications   Pulmonary nodules Radiologist recommends for high risk patients follow-up imaging with noncontrast CT in 12 months   Prediabetes Follow serum glucose in context of receiving IV steroids-if glucose remains significantly elevated may need to follow CBGs and provide SSI   HLD Continue Crestor   GERD Continue PPI, increase to bid due to reports uncontrolled symptom  Reports brown stool but blood in toilet, denies ab pain on 1/8 Urine is clear Reviewed colonoscopy from 08/2020 , did showed internal hemorrhoid and diverticula  Hemorrhoid bleed? Monitor hgb, avoid constipation    Body mass index is 37.83 kg/m..meet obesity criteria      I have Reviewed nursing notes, Vitals, pain scores, I/o's, Lab results and  imaging results since pt's last encounter, details please see discussion above  I ordered the following labs:  Unresulted Labs (From admission, onward)     Start     Ordered   11/20/22 0500  Creatinine, serum  (enoxaparin (LOVENOX)    CrCl >/= 30 ml/min)  Weekly,   R     Comments: while on enoxaparin therapy    11/13/22 0708   11/18/22 0500  CBC with Differential/Platelet  Tomorrow morning,   R  Question:  Specimen collection method  Answer:  Lab=Lab collect   11/17/22 0916   11/18/22 6378  Basic metabolic panel  Tomorrow morning,   R       Question:  Specimen collection method  Answer:  Lab=Lab collect   11/17/22 0916   11/18/22 0500  Magnesium  Tomorrow morning,   R       Question:  Specimen collection method  Answer:  Lab=Lab collect   11/17/22 0916   11/18/22 0500  Phosphorus  Tomorrow morning,   R       Question:  Specimen collection  method  Answer:  Lab=Lab collect   11/17/22 0916             DVT prophylaxis: enoxaparin (LOVENOX) injection 40 mg Start: 11/13/22 1000   Code Status:   Code Status: Full Code  Family Communication: none at bedside  Disposition:   Dispo: The patient is from: home              Anticipated d/c is to: home              Anticipated d/c date is: TBD  Antimicrobials:    Anti-infectives (From admission, onward)    Start     Dose/Rate Route Frequency Ordered Stop   11/17/22 1000  cefTRIAXone (ROCEPHIN) 1 g in sodium chloride 0.9 % 100 mL IVPB        1 g 200 mL/hr over 30 Minutes Intravenous Every 24 hours 11/17/22 0912     11/17/22 1000  azithromycin (ZITHROMAX) tablet 500 mg        500 mg Oral Daily 11/17/22 0912 11/22/22 0959   11/13/22 1000  oseltamivir (TAMIFLU) capsule 30 mg  Status:  Discontinued        30 mg Oral Daily 11/13/22 0708 11/13/22 0723   11/13/22 1000  oseltamivir (TAMIFLU) capsule 30 mg        30 mg Oral 2 times daily 11/13/22 0723 11/18/22 0959   11/13/22 0045  oseltamivir (TAMIFLU) capsule 30 mg  Status:  Discontinued        30 mg Oral 2 times daily 11/13/22 0038 11/13/22 0712          Objective: Vitals:   11/16/22 2120 11/16/22 2140 11/17/22 0511 11/17/22 0735  BP:  (!) 168/89 (!) 152/85   Pulse: 76 79 60   Resp: 19  20   Temp:   97.6 F (36.4 C)   TempSrc:   Oral   SpO2: 94%  100% 95%  Weight:      Height:        Intake/Output Summary (Last 24 hours) at 11/17/2022 0917 Last data filed at 11/17/2022 0500 Gross per 24 hour  Intake 1776 ml  Output 2750 ml  Net -974 ml   Filed Weights   11/14/22 1604  Weight: 116.2 kg    Examination:  General exam: alert, awake, communicative,calm, NAD Respiratory system: bilateral diffuse rhonchi 9 left > right). Respiratory effort normal. Cardiovascular system:  RRR.  Gastrointestinal system: Abdomen is nondistended, soft and nontender.  Normal bowel sounds heard. Central nervous system: Alert and  oriented. No focal neurological deficits. Extremities:  chronic bilateral lower extremity pitting edema Skin: No rashes, lesions or ulcers Psychiatry: Judgement and insight appear normal. Mood & affect appropriate.     Data Reviewed: I have personally reviewed  labs and visualized  imaging studies since the last encounter and formulate the plan        Scheduled Meds:  azithromycin  500 mg Oral Daily   budesonide  0.5 mg Nebulization BID   buPROPion  450 mg Oral q morning   docusate sodium  100 mg Oral BID   doxazosin  8 mg Oral QHS   enoxaparin (LOVENOX) injection  40 mg Subcutaneous Q24H   fluticasone  2 spray Each Nare Daily   furosemide  40 mg Oral Daily   gabapentin  600 mg Oral QHS   guaiFENesin  1,200 mg Oral BID   ipratropium  2 spray Each Nare QHS   ipratropium-albuterol  3 mL Nebulization TID   methocarbamol  500 mg Oral BID   methylPREDNISolone (SOLU-MEDROL) injection  80 mg Intravenous Q12H   mirtazapine  15 mg Oral QHS   montelukast  10 mg Oral QHS   multivitamin with minerals  1 tablet Oral q morning   oseltamivir  30 mg Oral BID   pantoprazole  20 mg Oral BID   potassium chloride SA  20 mEq Oral BID   rosuvastatin  5 mg Oral q morning   saccharomyces boulardii  250 mg Oral BID   sodium chloride flush  3 mL Intravenous Q12H   Continuous Infusions:  cefTRIAXone (ROCEPHIN)  IV       LOS: 4 days   Time spent: 7mns  FFlorencia Reasons MD PhD FACP [facility]  Available via Epic secure chat 7am-7pm for nonurgent issues Please page for urgent issues To page the attending provider between 7A-7P or the covering provider during after hours 7P-7A, please log into the web site [email or web address] and access using universal [place] password for that web site. If you do not have the password, please call the hospital operator.    11/17/2022, 9:17 AM

## 2022-11-17 NOTE — Care Management Important Message (Signed)
Important Message  Patient Details IM Letter given. Name: [name] MRN: [id number] Date of Birth: 11-Dec-1940   Medicare Important Message Given:  Yes     Kerin Salen 11/17/2022, 11:40 AM

## 2022-11-17 NOTE — Progress Notes (Signed)
PT refused CPT at approximately 1143. PT stated he had performed earlier. RN aware

## 2022-11-17 NOTE — Progress Notes (Signed)
PT demonstrated hands on understanding of Flutter device- NPC at this time. 

## 2022-11-18 ENCOUNTER — Ambulatory Visit: Payer: Medicare Other | Admitting: Podiatry

## 2022-11-18 DIAGNOSIS — G4733 Obstructive sleep apnea (adult) (pediatric): Secondary | ICD-10-CM | POA: Insufficient documentation

## 2022-11-18 DIAGNOSIS — E785 Hyperlipidemia, unspecified: Secondary | ICD-10-CM

## 2022-11-18 DIAGNOSIS — J09X1 Influenza due to identified novel influenza A virus with pneumonia: Secondary | ICD-10-CM | POA: Diagnosis not present

## 2022-11-18 DIAGNOSIS — J9601 Acute respiratory failure with hypoxia: Secondary | ICD-10-CM | POA: Diagnosis not present

## 2022-11-18 DIAGNOSIS — J4531 Mild persistent asthma with (acute) exacerbation: Secondary | ICD-10-CM

## 2022-11-18 DIAGNOSIS — K921 Melena: Secondary | ICD-10-CM | POA: Insufficient documentation

## 2022-11-18 DIAGNOSIS — R911 Solitary pulmonary nodule: Secondary | ICD-10-CM | POA: Insufficient documentation

## 2022-11-18 LAB — CBC WITH DIFFERENTIAL/PLATELET
Abs Immature Granulocytes: 0.85 10*3/uL — ABNORMAL HIGH (ref 0.00–0.07)
Basophils Absolute: 0.1 10*3/uL (ref 0.0–0.1)
Basophils Relative: 1 %
Eosinophils Absolute: 0 10*3/uL (ref 0.0–0.5)
Eosinophils Relative: 0 %
HCT: 36 % — ABNORMAL LOW (ref 39.0–52.0)
Hemoglobin: 12.1 g/dL — ABNORMAL LOW (ref 13.0–17.0)
Immature Granulocytes: 7 %
Lymphocytes Relative: 7 %
Lymphs Abs: 0.9 10*3/uL (ref 0.7–4.0)
MCH: 33.3 pg (ref 26.0–34.0)
MCHC: 33.6 g/dL (ref 30.0–36.0)
MCV: 99.2 fL (ref 80.0–100.0)
Monocytes Absolute: 0.7 10*3/uL (ref 0.1–1.0)
Monocytes Relative: 6 %
Neutro Abs: 9.9 10*3/uL — ABNORMAL HIGH (ref 1.7–7.7)
Neutrophils Relative %: 79 %
Platelets: 175 10*3/uL (ref 150–400)
RBC: 3.63 MIL/uL — ABNORMAL LOW (ref 4.22–5.81)
RDW: 14.4 % (ref 11.5–15.5)
WBC: 12.5 10*3/uL — ABNORMAL HIGH (ref 4.0–10.5)
nRBC: 0.2 % (ref 0.0–0.2)

## 2022-11-18 LAB — BASIC METABOLIC PANEL
Anion gap: 10 (ref 5–15)
BUN: 39 mg/dL — ABNORMAL HIGH (ref 8–23)
CO2: 26 mmol/L (ref 22–32)
Calcium: 8.5 mg/dL — ABNORMAL LOW (ref 8.9–10.3)
Chloride: 102 mmol/L (ref 98–111)
Creatinine, Ser: 1.66 mg/dL — ABNORMAL HIGH (ref 0.61–1.24)
GFR, Estimated: 41 mL/min — ABNORMAL LOW (ref >60)
Glucose, Bld: 178 mg/dL — ABNORMAL HIGH (ref 70–99)
Potassium: 4.2 mmol/L (ref 3.5–5.1)
Sodium: 138 mmol/L (ref 135–145)

## 2022-11-18 LAB — MAGNESIUM: Magnesium: 2.1 mg/dL (ref 1.7–2.4)

## 2022-11-18 LAB — PHOSPHORUS: Phosphorus: 4.2 mg/dL (ref 2.5–4.6)

## 2022-11-18 MED ORDER — PREDNISONE 20 MG PO TABS
40.0000 mg | ORAL_TABLET | Freq: Every day | ORAL | Status: DC
  Administered 2022-11-19: 40 mg via ORAL
  Filled 2022-11-18: qty 2

## 2022-11-18 NOTE — Assessment & Plan Note (Addendum)
With exacerbation PFTs 2021 not particularly bad.  Suspect there may be some component of bronchospasm here.  O2 needs still improving.  COmpleted 5 days Rocephin and azithromycin - Continue prednisone taper - Continue bronchodilators, Mucinex, etc. - Continue aggressive pulmonary toilet - Continue Singulair

## 2022-11-18 NOTE — Assessment & Plan Note (Signed)
Continue Crestor 

## 2022-11-18 NOTE — Assessment & Plan Note (Signed)
Completed 5 days oseltamivir. - Pulmonary toilet

## 2022-11-18 NOTE — Progress Notes (Signed)
Arrived to PT room for scheduled CPT. PT sitting on side of bed eating. PT interested in eating and stated he had done and will do flutter. RT will see PT approximately 1400 for nebulizer treatment.

## 2022-11-18 NOTE — Assessment & Plan Note (Signed)
Continue CPAP.  

## 2022-11-18 NOTE — Hospital Course (Addendum)
[name] is an 82 y.o. M with obesity, OSA on CPAP, CKD IIIb, and asthma who presented with cough and dyspnea.   1/3: Admitted on O2, RSV and influenza + 1/5: Started steroids 1/8: Started antibiotics

## 2022-11-18 NOTE — Assessment & Plan Note (Signed)
-   Follow up imaging at appropriate interval, repeat noncontrast CT at 12 months

## 2022-11-18 NOTE — Assessment & Plan Note (Signed)
Likely hemorrhoids, Hgb stbale - MOnitor stool output

## 2022-11-18 NOTE — Assessment & Plan Note (Signed)
Cr stable relative to baseline 1.6

## 2022-11-18 NOTE — Plan of Care (Signed)

## 2022-11-18 NOTE — Care Plan (Signed)
Spoke with son by phone

## 2022-11-18 NOTE — Progress Notes (Signed)
PT continues to utilize Flutter- has PC.

## 2022-11-18 NOTE — Assessment & Plan Note (Signed)
BMI 37

## 2022-11-18 NOTE — Progress Notes (Signed)
PT will self manage CPAP

## 2022-11-18 NOTE — Assessment & Plan Note (Signed)
Multifactorial due to RSV, influenza, age, obesity, asthma.

## 2022-11-18 NOTE — Progress Notes (Signed)
Arrived for scheduled CPT. PT sitting on side of bed eating. PT interested in eating and states he will do Flutter. States he has been doing Flutter independently. PT continues to have bilateral coarse breath sounds- but seems to be improving slightly.

## 2022-11-18 NOTE — Progress Notes (Signed)
  Progress Note   Patient: [name] MIW:[id number] DOB: [birth year] DOA: 11/12/2022     5 DOS: the patient was seen and examined on 11/18/2022  at 10:06AM      Brief hospital course: [name] is an 82 y.o. M with obesity, OSA on CPAP, CKD IIIb, and asthma who presented with cough and dyspnea.   1/3: Admitted on O2, RSV and influenza + 1/5: Started steroids 1/8: Started antibiotics     Assessment and Plan: * Influenza A with pneumonia Completed 5 days oseltamivir. - Pulmonary toilet    Acute respiratory failure with hypoxia ([place]) Multifactorial due to RSV, influenza, age, obesity, COPD.  Blood in stool Likely hemorrhoids, Hgb stbale - MOnitor stool output  Pulmonary nodules - Follow up imaging at appropriate interval, repeat noncontrast CT at 12 months  OSA (obstructive sleep apnea) - Continue CPAP  Obesity (BMI 30-39.9) BMI 37   Mild persistent asthma With exacerbation PFTs 2021 not particularly bad.  Suspect there may be some component of bronchospasm here. - Continue antibiotics, day 3 of 5 - Continue steroids, reduce to prednisone 40 daily for total 5 days - Continue bronchodilators, Mucinex, etc. - Continue aggressive pulmonary toilet - Continue Singulair  Dyslipidemia - Continue Crestor  Stage 3b chronic kidney disease (CKD) (HCC) Cr stable relateive to baseline 1.6          Subjective: No impirovement or change.  No fever, no confusion, no respiratory distress.       Physical Exam: BP (!) 159/73 (BP Location: Left Arm)   Pulse 77   Temp 98.4 F (36.9 C) (Oral)   Resp 17   Ht '5\' 9"'$  (1.753 m)   Wt 116.2 kg   SpO2 97%   BMI 37.83 kg/m   Obese adult male, sitting at the edge of the bed, no acute distress RRR, no murmurs, no peripheral edema Respiratory rate seems normal at rest, he has coarse breath sounds bilaterally, no wheezing Attention normal, affect appropriate, judgment insight appear normal    Data  Reviewed: Creatinine 1.6, no change, otherwise basic metabolic panel unremarkable Hemogram shows white blood cell count up to 12, consistent with steroid use, no change in hemoglobin, no other new findings    Family Communication: Called to son, no answer    Disposition: Status is: Inpatient Still on 4L O2, not on home O2        Author: Edwin Dada, MD 11/18/2022 1:45 PM  For on call review [email or web address]

## 2022-11-19 ENCOUNTER — Encounter (HOSPITAL_BASED_OUTPATIENT_CLINIC_OR_DEPARTMENT_OTHER): Payer: Medicare Other | Admitting: General Surgery

## 2022-11-19 DIAGNOSIS — J09X1 Influenza due to identified novel influenza A virus with pneumonia: Secondary | ICD-10-CM | POA: Diagnosis not present

## 2022-11-19 DIAGNOSIS — K921 Melena: Secondary | ICD-10-CM | POA: Diagnosis not present

## 2022-11-19 DIAGNOSIS — J9601 Acute respiratory failure with hypoxia: Secondary | ICD-10-CM | POA: Diagnosis not present

## 2022-11-19 DIAGNOSIS — E785 Hyperlipidemia, unspecified: Secondary | ICD-10-CM | POA: Diagnosis not present

## 2022-11-19 MED ORDER — PREDNISONE 10 MG PO TABS: 10.0000 mg | ORAL_TABLET | Freq: Every day | ORAL | Status: DC

## 2022-11-19 MED ORDER — PREDNISONE 20 MG PO TABS
20.0000 mg | ORAL_TABLET | Freq: Every day | ORAL | Status: DC
  Administered 2022-11-23: 20 mg via ORAL
  Filled 2022-11-19: qty 1

## 2022-11-19 MED ORDER — PREDNISONE 20 MG PO TABS
40.0000 mg | ORAL_TABLET | Freq: Every day | ORAL | Status: AC
  Administered 2022-11-20: 40 mg via ORAL
  Filled 2022-11-19: qty 2

## 2022-11-19 MED ORDER — SALINE SPRAY 0.65 % NA SOLN
1.0000 | NASAL | Status: DC | PRN
  Filled 2022-11-19: qty 44

## 2022-11-19 MED ORDER — PREDNISONE 20 MG PO TABS
30.0000 mg | ORAL_TABLET | Freq: Every day | ORAL | Status: AC
  Administered 2022-11-21 – 2022-11-22 (×2): 30 mg via ORAL
  Filled 2022-11-19 (×2): qty 1

## 2022-11-19 NOTE — Plan of Care (Signed)

## 2022-11-19 NOTE — Progress Notes (Signed)
Pt does not want to do chest vest this morning he says it does not work for him. RT will get a flutter valve for pt.

## 2022-11-19 NOTE — Progress Notes (Signed)
PT Cancellation Note  Patient Details Name: [name] MRN: [id number] DOB: [birth year]   Cancelled Treatment:    Reason Eval/Treat Not Completed: Other (comment) (pt stated he's on a phone call that he can't get off of. Will check back later today.)   Philomena Doheny PT 11/19/2022  Acute Rehabilitation Services  Office [phone]

## 2022-11-19 NOTE — Evaluation (Signed)
Physical Therapy Evaluation Patient Details Name: [name] MRN: [id number] DOB: 01-Nov-1941 Today's Date: 11/19/2022  History of Present Illness  82 y.o. M with obesity, OSA on CPAP, CKD IIIb, and asthma who presented with cough and dyspnea. Pt tested positive for flu and RSV.  Clinical Impression  Pt admitted with above diagnosis. Pt ambulated 83' with RW, SpO2 84% on room air walking, 94% on 1.5L O2 at rest, 90% on room air at rest. At baseline he ambulates with a straight cane, he reports he does not use O2 at home.  Pt currently with functional limitations due to the deficits listed below (see PT Problem List). Pt will benefit from skilled PT to increase their independence and safety with mobility to allow discharge to the venue listed below.          Recommendations for follow up therapy are one component of a multi-disciplinary discharge planning process, led by the attending physician.  Recommendations may be updated based on patient status, additional functional criteria and insurance authorization.  Follow Up Recommendations Home health PT      Assistance Recommended at Discharge Intermittent Supervision/Assistance  Patient can return home with the following  A little help with bathing/dressing/bathroom;Assistance with cooking/housework;Assist for transportation;Help with stairs or ramp for entrance    Equipment Recommendations None recommended by PT  Recommendations for Other Services       Functional Status Assessment Patient has had a recent decline in their functional status and demonstrates the ability to make significant improvements in function in a reasonable and predictable amount of time.     Precautions / Restrictions Precautions Precautions: Fall Precaution Comments: denies falls in past 6 months Restrictions Weight Bearing Restrictions: No      Mobility  Bed Mobility               General bed mobility comments: sitting up at edge of bed     Transfers Overall transfer level: Modified independent Equipment used: Rolling walker (2 wheels) Transfers: Sit to/from Stand Sit to Stand: Modified independent (Device/Increase time)                Ambulation/Gait Ambulation/Gait assistance: Supervision Gait Distance (Feet): 80 Feet Assistive device: Rolling walker (2 wheels) Gait Pattern/deviations: Step-through pattern, Decreased stride length Gait velocity: WFL     General Gait Details: steady with RW, no loss of balance, SpO2 84% on room air walking, 3/4 dyspnea  Stairs            Wheelchair Mobility    Modified Rankin (Stroke Patients Only)       Balance Overall balance assessment: Modified Independent                                           Pertinent Vitals/Pain Pain Assessment Pain Assessment: No/denies pain    Home Living Family/patient expects to be discharged to:: Private residence Living Arrangements: Alone Available Help at Discharge: Family Type of Home: House Home Access: Stairs to enter Entrance Stairs-Rails: Can reach both Entrance Stairs-Number of Steps: 3 Alternate Level Stairs-Number of Steps: 20 Home Layout: Two level Home Equipment: Cane - single Barista (2 wheels)      Prior Function Prior Level of Function : Independent/Modified Independent;Driving             Mobility Comments: Pt uses SPC when going out of home ADLs Comments: independent  Hand Dominance        Extremity/Trunk Assessment   Upper Extremity Assessment Upper Extremity Assessment: Overall WFL for tasks assessed    Lower Extremity Assessment Lower Extremity Assessment: RLE deficits/detail RLE Deficits / Details: 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), knee ext 4/5 RLE Sensation: WNL    Cervical / Trunk Assessment Cervical / Trunk Assessment: Normal  Communication   Communication: No difficulties  Cognition Arousal/Alertness: Awake/alert Behavior  During Therapy: WFL for tasks assessed/performed Overall Cognitive Status: Within Functional Limits for tasks assessed                                          General Comments      Exercises     Assessment/Plan    PT Assessment Patient needs continued PT services  PT Problem List Decreased activity tolerance;Cardiopulmonary status limiting activity;Decreased mobility       PT Treatment Interventions Gait training;Therapeutic exercise;Therapeutic activities;Functional mobility training    PT Goals (Current goals can be found in the Care Plan section)  Acute Rehab PT Goals Patient Stated Goal: return to independence PT Goal Formulation: With patient Time For Goal Achievement: 12/03/22 Potential to Achieve Goals: Good    Frequency Min 3X/week     Co-evaluation               AM-PAC PT "6 Clicks" Mobility  Outcome Measure Help needed turning from your back to your side while in a flat bed without using bedrails?: None Help needed moving from lying on your back to sitting on the side of a flat bed without using bedrails?: A Little Help needed moving to and from a bed to a chair (including a wheelchair)?: None Help needed standing up from a chair using your arms (e.g., wheelchair or bedside chair)?: None Help needed to walk in hospital room?: None Help needed climbing 3-5 steps with a railing? : A Little 6 Click Score: 22    End of Session Equipment Utilized During Treatment: Oxygen Activity Tolerance: Patient limited by fatigue Patient left: in bed;with call bell/phone within reach Nurse Communication: Mobility status PT Visit Diagnosis: Difficulty in walking, not elsewhere classified (R26.2)    Time: 1400-1433 PT Time Calculation (min) (ACUTE ONLY): 33 min   Charges:   PT Evaluation $PT Eval Moderate Complexity: 1 Mod PT Treatments $Gait Training: 8-22 mins        Blondell Reveal Kistler PT 11/19/2022  Acute Rehabilitation  Services  Office [phone]

## 2022-11-19 NOTE — Progress Notes (Signed)
  Progress Note   Patient: [name] TMA:[id number] DOB: [birth year] DOA: 11/12/2022     6 DOS: the patient was seen and examined on 11/19/2022 at 8:39 AM      Brief hospital course: [name] is an 82 y.o. M with obesity, OSA on CPAP, CKD IIIb, and asthma who presented with cough and dyspnea.   1/3: Admitted on O2, RSV and influenza + 1/5: Started steroids 1/8: Started antibiotics     Assessment and Plan: * Influenza A with pneumonia Completed 5 days oseltamivir. - Pulmonary toilet    Acute respiratory failure with hypoxia ([place]) Multifactorial due to RSV, influenza, age, obesity, COPD.  Blood in stool Likely hemorrhoids, Hgb stable - Monitor stool output  Pulmonary nodules - Follow up imaging at appropriate interval, repeat noncontrast CT at 12 months  OSA (obstructive sleep apnea) - Continue CPAP  Obesity (BMI 30-39.9) BMI 37   Mild persistent asthma With exacerbation PFTs 2021 not particularly bad.  Suspect there may be some component of bronchospasm here. - Continue Rocephin and azithromycin, day 4 of 5 - Continue prednisone but begin tapering - Continue bronchodilators, Mucinex, etc. - Continue aggressive pulmonary toilet - Continue Singulair  Dyslipidemia - Continue Crestor  Stage 3b chronic kidney disease (CKD) (HCC) Cr stable relative to baseline 1.6          Subjective: Patient has a lot of rhinorrhea, sore throat, head congestion.  No fever, no respiratory distress.     Physical Exam: BP (!) 153/75 (BP Location: Right Arm)   Pulse (!) 59   Temp (!) 97.5 F (36.4 C) (Oral)   Resp 16   Ht '5\' 9"'$  (1.753 m)   Wt 116.2 kg   SpO2 99%   BMI 37.83 kg/m   Obese adult male, sitting on the edge of the bed, no acute distress, interactive, sounds congested RRR, no murmurs, no pitting edema Respiratory rate normal, coarse rhonchi bilaterally, no wheezing Attention normal, affect appropriate, judgment insight appear normal    Data  Reviewed: No new labs  Family Communication: Patient is requested not to communicate with family    Disposition: Status is: Inpatient Patient remains on 3 L supplemental oxygen, is not on home oxygen        Author: Edwin Dada, MD 11/19/2022 10:35 AM  For on call review [email or web address].

## 2022-11-20 DIAGNOSIS — J9601 Acute respiratory failure with hypoxia: Secondary | ICD-10-CM | POA: Diagnosis not present

## 2022-11-20 DIAGNOSIS — J09X1 Influenza due to identified novel influenza A virus with pneumonia: Secondary | ICD-10-CM | POA: Diagnosis not present

## 2022-11-20 DIAGNOSIS — E785 Hyperlipidemia, unspecified: Secondary | ICD-10-CM | POA: Diagnosis not present

## 2022-11-20 DIAGNOSIS — K921 Melena: Secondary | ICD-10-CM | POA: Diagnosis not present

## 2022-11-20 LAB — CBC
HCT: 37.9 % — ABNORMAL LOW (ref 39.0–52.0)
Hemoglobin: 12.3 g/dL — ABNORMAL LOW (ref 13.0–17.0)
MCH: 32.5 pg (ref 26.0–34.0)
MCHC: 32.5 g/dL (ref 30.0–36.0)
MCV: 100 fL (ref 80.0–100.0)
Platelets: 162 10*3/uL (ref 150–400)
RBC: 3.79 MIL/uL — ABNORMAL LOW (ref 4.22–5.81)
RDW: 14.5 % (ref 11.5–15.5)
WBC: 12.3 10*3/uL — ABNORMAL HIGH (ref 4.0–10.5)
nRBC: 0.2 % (ref 0.0–0.2)

## 2022-11-20 LAB — BASIC METABOLIC PANEL
Anion gap: 10 (ref 5–15)
BUN: 41 mg/dL — ABNORMAL HIGH (ref 8–23)
CO2: 28 mmol/L (ref 22–32)
Calcium: 8.4 mg/dL — ABNORMAL LOW (ref 8.9–10.3)
Chloride: 103 mmol/L (ref 98–111)
Creatinine, Ser: 1.53 mg/dL — ABNORMAL HIGH (ref 0.61–1.24)
GFR, Estimated: 45 mL/min — ABNORMAL LOW (ref >60)
Glucose, Bld: 107 mg/dL — ABNORMAL HIGH (ref 70–99)
Potassium: 3.6 mmol/L (ref 3.5–5.1)
Sodium: 141 mmol/L (ref 135–145)

## 2022-11-20 NOTE — Progress Notes (Signed)
Physical Therapy Treatment Patient Details Name: [name] MRN: [id number] DOB: 1940/11/11 Today's Date: 11/20/2022   History of Present Illness 82 y.o. M with obesity, OSA on CPAP, CKD IIIb, and asthma who presented with cough and dyspnea. Pt tested positive for flu and RSV.    PT Comments    Pt tolerated increased ambulation distance of 160' with RW, no loss of balance, SpO2 87% on room air walking, 3/4 dyspnea. SpO2 90-92% on room air at rest.    Recommendations for follow up therapy are one component of a multi-disciplinary discharge planning process, led by the attending physician.  Recommendations may be updated based on patient status, additional functional criteria and insurance authorization.  Follow Up Recommendations  Home health PT     Assistance Recommended at Discharge Intermittent Supervision/Assistance  Patient can return home with the following A little help with bathing/dressing/bathroom;Assistance with cooking/housework;Assist for transportation;Help with stairs or ramp for entrance   Equipment Recommendations  None recommended by PT    Recommendations for Other Services       Precautions / Restrictions Precautions Precautions: Fall Precaution Comments: denies falls in past 6 months Restrictions Weight Bearing Restrictions: No     Mobility  Bed Mobility               General bed mobility comments: sitting up at edge of bed    Transfers Overall transfer level: Modified independent Equipment used: Straight cane Transfers: Sit to/from Stand Sit to Stand: Supervision           General transfer comment: mildly unsteady with SPC, pt declined use of RW for walking to bathroom from bed    Ambulation/Gait Ambulation/Gait assistance: Supervision Gait Distance (Feet): 160 Feet Assistive device: Rolling walker (2 wheels) Gait Pattern/deviations: Step-through pattern, Decreased stride length Gait velocity: WFL     General Gait Details:  steady with RW, no loss of balance, SpO2 87% on room air walking, 3/4 dyspnea   Stairs             Wheelchair Mobility    Modified Rankin (Stroke Patients Only)       Balance Overall balance assessment: Modified Independent                                          Cognition Arousal/Alertness: Awake/alert Behavior During Therapy: WFL for tasks assessed/performed Overall Cognitive Status: Within Functional Limits for tasks assessed                                          Exercises      General Comments        Pertinent Vitals/Pain Pain Assessment Pain Assessment: No/denies pain Breathing: normal Negative Vocalization: none Facial Expression: smiling or inexpressive Body Language: relaxed Consolability: no need to console PAINAD Score: 0    Home Living                          Prior Function            PT Goals (current goals can now be found in the care plan section) Acute Rehab PT Goals Patient Stated Goal: return to independence PT Goal Formulation: With patient Time For Goal Achievement: 12/03/22 Potential to Achieve Goals: Good Progress towards PT  goals: Progressing toward goals    Frequency    Min 3X/week      PT Plan Current plan remains appropriate    Co-evaluation              AM-PAC PT "6 Clicks" Mobility   Outcome Measure  Help needed turning from your back to your side while in a flat bed without using bedrails?: None Help needed moving from lying on your back to sitting on the side of a flat bed without using bedrails?: A Little Help needed moving to and from a bed to a chair (including a wheelchair)?: None Help needed standing up from a chair using your arms (e.g., wheelchair or bedside chair)?: None Help needed to walk in hospital room?: None Help needed climbing 3-5 steps with a railing? : A Little 6 Click Score: 22    End of Session Equipment Utilized During  Treatment: Oxygen Activity Tolerance: Patient limited by fatigue Patient left: in bed;with call bell/phone within reach Nurse Communication: Mobility status PT Visit Diagnosis: Difficulty in walking, not elsewhere classified (R26.2)     Time: 2440-1027 PT Time Calculation (min) (ACUTE ONLY): 26 min  Charges:  $Gait Training: 8-22 mins $Therapeutic Activity: 8-22 mins                     Blondell Reveal Kistler PT 11/20/2022  Acute Rehabilitation Services  Office [phone]

## 2022-11-20 NOTE — Progress Notes (Signed)
  Progress Note   Patient: [name] ZDG:[id number] DOB: 1941-07-06 DOA: 11/12/2022     7 DOS: the patient was seen and examined on 11/20/2022 at 10:05AM      Brief hospital course: Mr. Kasler is an 82 y.o. M with obesity, OSA on CPAP, CKD IIIb, and asthma who presented with cough and dyspnea.   1/3: Admitted on O2, RSV and influenza + 1/5: Started steroids 1/8: Started antibiotics     Assessment and Plan: * Influenza A with pneumonia Completed 5 days oseltamivir. - Pulmonary toilet    Acute respiratory failure with hypoxia ([place]) Multifactorial due to RSV, influenza, age, obesity, asthma.  Blood in stool Likely hemorrhoids, Hgb stable.  None further. - Monitor stool output  Pulmonary nodules - Follow up imaging at appropriate interval, repeat noncontrast CT at 12 months  OSA (obstructive sleep apnea) - Continue CPAP  Obesity (BMI 30-39.9) BMI 37   Mild persistent asthma With exacerbation PFTs 2021 not particularly bad.  Suspect there may be some component of bronchospasm here.  O2 needs improving day by day - Continue Rocephin and azithromycin, day 5 of 5 - Continue prednisone taper - Continue bronchodilators, Mucinex, etc. - Continue aggressive pulmonary toilet - Continue Singulair  Dyslipidemia - Continue Crestor  Stage 3b chronic kidney disease (CKD) (HCC) Cr stable relative to baseline 1.6          Subjective: Patient with persistent sinus symptoms, sore throat.  No fever, no respiratory distress.  Able to ambulate with physical therapy.     Physical Exam: BP (!) 156/76 (BP Location: Left Arm)   Pulse 68   Temp (!) 97.5 F (36.4 C) (Oral)   Resp 18   Ht '5\' 9"'$  (1.753 m)   Wt 116.2 kg   SpO2 97%   BMI 37.83 kg/m   Obese adult male, sitting on the edge of the bed, interactive and appropriate RRR, no murmurs, no peripheral edema Respiratory rate normal, some coarse breath sounds bilaterally, some wheezing bilaterally Abdomen  stented Attention normal, affect appropriate, judgment insight appear normal, speech fluent, face symmetric, moves upper extremities with normal strength and coordination     Data Reviewed: Patient metabolic panel shows creatinine 1.5, no change Blood counts show hemoglobin 12, no change, due to steroids  Family Communication: Patient is asked does not contact daily    Disposition: Status is: Inpatient The patient was admitted with influenza A, likely bacterial superinfection, and asthma flare  He has had a prolonged course due to his obesity, asthma, and age, but appears to be improving still has a last few days, weaning down to room air at rest at times  The next 1 to 2 days, he will be weaned off of oxygen completely and stable for discharge        Author: Edwin Dada, MD 11/20/2022 11:13 AM  For on call review [email or web address]

## 2022-11-20 NOTE — TOC Initial Note (Addendum)
Transition of Care [facility]) - Initial/Assessment Note    Patient Details  Name: [name] MRN: [id number] Date of Birth: 06/07/1941  Transition of Care [facility]) CM/SW Contact:    Vassie Moselle, LCSW Phone Number: 11/20/2022, 12:18 PM  Clinical Narrative:                 Met with pt to discuss recommendation for [facility]. Pt shares that his house is very old and the floor and ceiling have fallen in. He shares there is an active case with the city, his insurance, and mortgage regarding who is responsible for having it fixed. He reports that due to this his house is all packed up and not conducive for home health. Pt has been working with his PCP to get a referral for OPPT. CSW encouraged pt to follow up with PCP again at discharge to have referral placed for OPPT.  Pt shares he has a RW at home but, does not use it due to not having space in the house. Pt currently weaned to room air. TOC will continue to follow for possible home O2 need.   Expected Discharge Plan: Home/Self Care Barriers to Discharge: No Barriers Identified   Patient Goals and CMS Choice Patient states their goals for this hospitalization and ongoing recovery are:: To return home CMS Medicare.gov Compare Post Acute Care list provided to:: Patient Choice offered to / list presented to : Patient      Expected Discharge Plan and Services In-house Referral: NA Discharge Planning Services: NA Post Acute Care Choice: NA Living arrangements for the past 2 months: Single Family Home                 DME Arranged: N/A DME Agency: NA                  Prior Living Arrangements/Services Living arrangements for the past 2 months: Single Family Home Lives with:: Self Patient language and need for interpreter reviewed:: Yes Do you feel safe going back to the place where you live?: Yes      Need for Family Participation in Patient Care: No (Comment) Care giver support system in place?: No (comment) Current home  services: DME (RW) Criminal Activity/Legal Involvement Pertinent to Current Situation/Hospitalization: No - Comment as needed  Activities of Daily Living Home Assistive Devices/Equipment: Cane (specify quad or straight) ADL Screening (condition at time of admission) Patient's cognitive ability adequate to safely complete daily activities?: Yes Is the patient deaf or have difficulty hearing?: No Does the patient have difficulty seeing, even when wearing glasses/contacts?: No Does the patient have difficulty concentrating, remembering, or making decisions?: No Patient able to express need for assistance with ADLs?: Yes Does the patient have difficulty dressing or bathing?: No Independently performs ADLs?: Yes (appropriate for developmental age) Does the patient have difficulty walking or climbing stairs?: No Weakness of Legs: None Weakness of Arms/Hands: None  Permission Sought/Granted   Permission granted to share information with : No              Emotional Assessment Appearance:: Appears stated age Attitude/Demeanor/Rapport: Engaged Affect (typically observed): Pleasant Orientation: : Oriented to Self, Oriented to Place, Oriented to  Time, Oriented to Situation Alcohol / Substance Use: Not Applicable Psych Involvement: No (comment)  Admission diagnosis:  RSV (respiratory syncytial virus infection) [B33.8] Influenza A [J10.1] Influenza A with pneumonia [J09.X1] Acute respiratory failure with hypoxia ([place]) [J96.01] Patient Active Problem List   Diagnosis Date Noted   Acute  respiratory failure with hypoxia ([place]) 11/18/2022   OSA (obstructive sleep apnea) 11/18/2022   Pulmonary nodules 11/18/2022   Blood in stool 11/18/2022   Influenza A with pneumonia 11/13/2022   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/2022   Primary osteoarthritis of left hip 04/23/2022   Osteomyelitis of great toe of right foot ([place]) 04/18/2022   Left hip pain 04/09/2022   Upper airway cough syndrome  02/03/2022   Acute rhinosinusitis 01/24/2022   Hypertension 09/30/2021   Allergic rhinitis due to pollen 03/25/2021   Benign prostatic hyperplasia with lower urinary tract symptoms 03/25/2021   Chronic fatigue syndrome 03/25/2021   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   Stage 3b chronic kidney disease (CKD) ([place]) 03/25/2021   Chronic pain 03/25/2021   Dyslipidemia 03/25/2021   Edema 03/25/2021   Generalized anxiety disorder 03/25/2021   Mild persistent asthma 03/25/2021   Moderate recurrent major depression ([place]) 03/25/2021   Obesity (BMI 30-39.9) 03/25/2021   Non-pressure chronic ulcer of other part of right foot limited to breakdown of skin ([place]) 03/25/2021   Prediabetes 03/25/2021   Primary insomnia 03/25/2021   Sleep disturbance 03/25/2021   Paresthesia of skin 06/08/2020   Right foot drop 06/08/2020   Body mass index (BMI) 35.0-35.9, adult 03/13/2020   Spondylosis of cervical region without myelopathy or radiculopathy 01/05/2020   Elevated blood-pressure reading, without diagnosis of hypertension 12/07/2019   Spinal stenosis of lumbar region with neurogenic claudication 01/15/2017   Spondylolisthesis of lumbar region 01/15/2017   Claw toe, acquired, left 01/06/2017   Achilles tendon contracture, bilateral 01/06/2017   Pain in metatarsus of both feet 01/06/2017   Tubular adenoma of colon 12/29/2016   Basal cell carcinoma 00/17/4944   Umbilical hernia s/p lap repair with mesh 06/27/2016 06/27/2016   Degenerative arthritis of knee 03/22/2015   Dyspnea on exertion 01/31/2015   Lumbar radiculopathy 12/12/2014   Facet hypertrophy of lumbar region 12/12/2014   Left knee pain 12/12/2014   Ulcer of lower limb ([place]) 01/10/2014   Varicose veins of lower extremities with other complications 96/75/9163   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 12/21/2013   Renal insufficiency 12/21/2013   Swelling in head/neck 07/28/2012   Venous (peripheral) insufficiency 05/25/2012    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   Pain in limb 02/12/2012   Hyperlipidemia 02/28/2008   Allergic rhinitis 02/28/2008   Sleep apnea 02/28/2008   PCP:  Cari Caraway, MD Pharmacy:   [facility] [id number] [place], [place] [address] Phone: [phone] Fax: [phone]     Social Determinants of Health (SDOH) Social History: SDOH Screenings   Food Insecurity: No Food Insecurity (11/14/2022)  Housing: Low Risk  (11/14/2022)  Transportation Needs: No Transportation Needs (11/14/2022)  Utilities: Not At Risk (11/14/2022)  Depression (PHQ2-9): Medium Risk (08/09/2020)  Tobacco Use: Low Risk  (11/13/2022)   SDOH Interventions: Housing Interventions: Intervention Not Indicated   Readmission Risk Interventions    11/20/2022   12:16 PM 11/14/2022    1:18 PM 05/07/2022   11:55 AM  Readmission Risk Prevention Plan  Transportation Screening  Complete Complete  PCP or Specialist Appt within 5-7 Days   Complete  PCP or Specialist Appt within 3-5 Days  Complete   Home Care Screening   Complete  Medication Review (RN CM)   Complete  HRI or Home Care Consult  Complete   Social Work Consult for [place] Planning/Counseling  Complete   Palliative Care Screening  Not Applicable  Medication Review Press photographer)  Complete   PCP or Specialist appointment within 3-5 days of discharge Complete    HRI or [place] Complete    SW Recovery Care/Counseling Consult Complete    [place] Not Applicable

## 2022-11-20 NOTE — Progress Notes (Signed)
PT Cancellation Note  Patient Details Name: KARTHIK WHITTINGHILL MRN: [id number] DOB: 1941-09-24   Cancelled Treatment:    Reason Eval/Treat Not Completed: Fatigue/lethargy limiting ability to participate (pt stated he's too tired to attempt ambulation at present. Will follow.)   Philomena Doheny PT 11/20/2022  Acute Rehabilitation Services  Office [phone]

## 2022-11-20 NOTE — Progress Notes (Signed)
SATURATION QUALIFICATIONS: (This note is used to comply with regulatory documentation for home oxygen)  Patient Saturations on Room Air at Rest = 92%  Patient Saturations on Room Air while Ambulating = 87%  Patient Saturations on TBD Liters of oxygen while Ambulating = TBD  Please briefly explain why patient needs home oxygen: to maintain appropriate SpO2 levels.   Blondell Reveal Kistler PT 11/20/2022  Acute Rehabilitation Services  Office [phone]

## 2022-11-21 DIAGNOSIS — J09X1 Influenza due to identified novel influenza A virus with pneumonia: Secondary | ICD-10-CM | POA: Diagnosis not present

## 2022-11-21 DIAGNOSIS — E785 Hyperlipidemia, unspecified: Secondary | ICD-10-CM | POA: Diagnosis not present

## 2022-11-21 DIAGNOSIS — K921 Melena: Secondary | ICD-10-CM | POA: Diagnosis not present

## 2022-11-21 DIAGNOSIS — J9601 Acute respiratory failure with hypoxia: Secondary | ICD-10-CM | POA: Diagnosis not present

## 2022-11-21 MED ORDER — SODIUM CHLORIDE 0.9 % IV SOLN
1.0000 g | Freq: Once | INTRAVENOUS | Status: AC
  Administered 2022-11-21: 1 g via INTRAVENOUS
  Filled 2022-11-21: qty 10

## 2022-11-21 NOTE — Care Management Important Message (Signed)
Important Message  Patient Details  Name: [name] MRN: [id number] Date of Birth: 08/08/1941   Medicare Important Message Given:  Yes     Memory Argue 11/21/2022, 1:35 PM

## 2022-11-21 NOTE — Progress Notes (Signed)
  Progress Note   Patient: [name] FXO:[id number] DOB: [birth year] DOA: 11/12/2022     8 DOS: the patient was seen and examined on 11/21/2022 at 8:47AM      Brief hospital course: [name] is an 82 y.o. M with obesity, OSA on CPAP, CKD IIIb, and asthma who presented with cough and dyspnea.   1/3: Admitted on O2, RSV and influenza + 1/5: Started steroids 1/8: Started antibiotics     Assessment and Plan: * Influenza A with pneumonia Completed 5 days oseltamivir. - Pulmonary toilet    Acute respiratory failure with hypoxia ([place]) Multifactorial due to RSV, influenza, age, obesity, asthma.  Blood in stool Likely hemorrhoids, Hgb stable.  None further. - Monitor stool output  Pulmonary nodules - Follow up imaging at appropriate interval, repeat noncontrast CT at 12 months  OSA (obstructive sleep apnea) - Continue CPAP  Obesity (BMI 30-39.9) BMI 37   Mild persistent asthma With exacerbation PFTs 2021 not particularly bad.  Suspect there may be some component of bronchospasm here.  O2 needs still improving.  COmpleted 5 days Rocephin and azithromycin - Continue prednisone taper - Continue bronchodilators, Mucinex, etc. - Continue aggressive pulmonary toilet - Continue Singulair  Dyslipidemia - Continue Crestor  Stage 3b chronic kidney disease (CKD) (HCC) Cr stable relative to baseline 1.6          Subjective: Still complaining of sore throat, congestion, sinuses. No respiratory distress, overall improving.       Physical Exam: BP (!) 151/90 (BP Location: Right Arm)   Pulse 87   Temp 98.2 F (36.8 C) (Oral)   Resp 20   Ht '5\' 9"'$  (1.753 m)   Wt 116.2 kg   SpO2 91%   BMI 37.83 kg/m   Obese adult male, sitting back in bed, interactive and appropriate RRR, no murmurs, no peripheral edema Respiratory rate normal, lungs clear without rales or wheezes bilaterally, abdomen distended but no evidence of discomfort Attention normal, affect  appropriate, judgment and insight appear normal, speech fluent, face symmetric    Data Reviewed: No new labs  Family Communication: Patient has asked that we not contact family    Disposition: Status is: Inpatient Admitted with influenza A, likely bacterial superinfection, asthma flare, this is all resolving, likely able to wean off oxygen in the next 24 hours and discharge home tomorrow        Author: Edwin Dada, MD 11/21/2022 11:01 AM  For on call review [email or web address].

## 2022-11-21 NOTE — Progress Notes (Signed)
PT refused CPAP. Wanted to used flutter valve.

## 2022-11-22 DIAGNOSIS — J9601 Acute respiratory failure with hypoxia: Secondary | ICD-10-CM | POA: Diagnosis not present

## 2022-11-22 DIAGNOSIS — K921 Melena: Secondary | ICD-10-CM | POA: Diagnosis not present

## 2022-11-22 DIAGNOSIS — E785 Hyperlipidemia, unspecified: Secondary | ICD-10-CM | POA: Diagnosis not present

## 2022-11-22 DIAGNOSIS — J09X1 Influenza due to identified novel influenza A virus with pneumonia: Secondary | ICD-10-CM | POA: Diagnosis not present

## 2022-11-22 MED ORDER — IPRATROPIUM-ALBUTEROL 0.5-2.5 (3) MG/3ML IN SOLN
3.0000 mL | Freq: Two times a day (BID) | RESPIRATORY_TRACT | Status: DC
  Administered 2022-11-22: 3 mL via RESPIRATORY_TRACT
  Filled 2022-11-22 (×2): qty 3

## 2022-11-22 NOTE — Progress Notes (Signed)
  Progress Note   Patient: [name] [id number]:[id number] DOB: 1941-06-29 DOA: 11/12/2022     9 DOS: the patient was seen and examined on 11/22/2022        Brief hospital course: [name] is an 82 y.o. M with obesity, OSA on CPAP, CKD IIIb, and asthma who presented with cough and dyspnea.   1/3: Admitted on O2, RSV and influenza + 1/5: Started steroids 1/8: Started antibiotics     Assessment and Plan: * Mild persistent asthma With exacerbation Continue prednisone Continue bronchodilators and Mucinex Continue Singulair Wean oxygen as able   Dyslipidemia - Continue Crestor            Subjective: Still congested, still on oxygen at rest and with ambulation, but weight down considerably in the last few days     Physical Exam: BP (!) 164/87 (BP Location: Right Arm)   Pulse (!) 103   Temp 97.8 F (36.6 C) (Oral)   Resp 20   Ht '5\' 9"'$  (1.753 m)   Wt 59.6 kg   SpO2 93%   BMI 19.40 kg/m   Obese adult male, lying in bed, no acute distress Interactive and appropriate Coarse breath sounds bilaterally Attention normal, affect appropriate, judgment insight appear normal    Data Reviewed:   Family Communication:     Disposition: Status is: Inpatient Patient still requiring supplemental oxygen but is close to be weaned off, hopefully home tomorrow        Author: Edwin Dada, MD 11/22/2022 4:23 PM  For on call review [email or web address].

## 2022-11-22 NOTE — Progress Notes (Signed)
Pt ambulated in hall using fww from room 1507 to room 1514 to see that patient. Pt tolerated fairly . On room air, pt had coughing spells lasting approx 10 seconds for which his saturation dropped to 85% with slow recovery time. Heart rate remains in 110s. Lowest saturation on room air without coughing was 87% and recovered with stopping and resting. Back to room and sitting on side of bed pt saturation at rest after 5 min of sitting is 94%. Pt remains on room air with his cannula nearby for prn use.

## 2022-11-22 NOTE — Progress Notes (Signed)
RT note: Pt. seen for scheduled Aerosol tx. and stated he was able to place himself on/off CPAP when ready, made aware to notify if need, equipment checked at bedside, made aware to notify if needed, plan to recheck on rounds.

## 2022-11-23 DIAGNOSIS — J09X1 Influenza due to identified novel influenza A virus with pneumonia: Secondary | ICD-10-CM | POA: Diagnosis not present

## 2022-11-23 MED ORDER — NYSTATIN 100000 UNIT/ML MT SUSP
5.0000 mL | Freq: Four times a day (QID) | OROMUCOSAL | Status: DC
  Administered 2022-11-23: 500000 [IU] via ORAL
  Filled 2022-11-23: qty 5

## 2022-11-23 MED ORDER — ALBUTEROL SULFATE (2.5 MG/3ML) 0.083% IN NEBU: 2.5000 mg | INHALATION_SOLUTION | RESPIRATORY_TRACT | 12 refills | Status: DC | PRN

## 2022-11-23 MED ORDER — PREDNISONE 10 MG PO TABS: ORAL_TABLET | ORAL | 0 refills | Status: DC

## 2022-11-23 NOTE — TOC Transition Note (Signed)
Transition of Care [facility]) - CM/SW Discharge Note   Patient Details  Name: [name] MRN: [id number] Date of Birth: 1940/11/11  Transition of Care [facility]) CM/SW Contact:  Illene Regulus, LCSW Phone Number: 11/23/2022, 9:50 AM   Clinical Narrative:   Pt is recommended for home O2 and a nebulizer , CSW sent referral to [facility] with [place] [facility] will be delivered to pt's room prior to d/c . Per TOC note on 11/20/22 pt declined [place] services. No additional needs.   [name] CSW received a message from pt's nurse regarding some concerns the pt has at home. CSW spoke with pt, he reported not having food and central heat in his home, however, he stated he does have a space heater. CSW offered resources for food assistance pt declined to accept. Pt stated once he leaves he can set that up for himself. Pt stated he needed a ride to get his car at the urgent care. Pt reported he is waiting for his friend to call him back. Pt reported his friend may be able to arrange to pick him up after 3:00 pm. CSW informed pt the hospital can give him a ride to his car. Pt reported wanting to wait on his friend.   Final next level of care: Home/Self Care Barriers to Discharge: No Barriers Identified   Patient Goals and CMS Choice CMS Medicare.gov Compare Post Acute Care list provided to:: Patient Choice offered to / list presented to : Patient  Discharge Placement                         Discharge Plan and Services Additional resources added to the After Visit Summary for   In-house Referral: NA Discharge Planning Services: NA Post Acute Care Choice: NA          DME Arranged: N/A DME Agency: NA                  Social Determinants of Health (SDOH) Interventions SDOH Screenings   Food Insecurity: No Food Insecurity (11/14/2022)  Housing: Low Risk  (11/14/2022)  Transportation Needs: No Transportation Needs (11/14/2022)  Utilities: Not At Risk (11/14/2022)  Depression (PHQ2-9): Medium  Risk (08/09/2020)  Tobacco Use: Low Risk  (11/13/2022)     Readmission Risk Interventions    11/20/2022   12:16 PM 11/14/2022    1:18 PM 05/07/2022   11:55 AM  Readmission Risk Prevention Plan  Transportation Screening  Complete Complete  PCP or Specialist Appt within 5-7 Days   Complete  PCP or Specialist Appt within 3-5 Days  Complete   Home Care Screening   Complete  Medication Review (RN CM)   Complete  HRI or Home Care Consult  Complete   Social Work Consult for [place] Planning/Counseling  Complete   Palliative Care Screening  Not Applicable   Medication Review Press photographer)  Complete   PCP or Specialist appointment within 3-5 days of discharge Complete    HRI or [place] Complete    SW Recovery Care/Counseling Consult Complete    [place] Not Applicable

## 2022-11-23 NOTE — Progress Notes (Signed)
Pt refused neb this am. Pt requested to sleep.

## 2022-11-23 NOTE — Progress Notes (Signed)
Dc order recd earlier. IV dc previously, pt tol well. DC instructions with follow up information given to pt who voices understanding. Pt voices no further concerns or c/o, just states that we are "evicting" him. No distress noted. Pt by wheelchair to main entrance to return home via a friend who will assist him and his belongings into his house.All belongings accompany pt.

## 2022-11-23 NOTE — TOC Transition Note (Incomplete Revision)
Transition of Care [facility]) - CM/SW Discharge Note   Patient Details  Name: [name] MRN: [id number] Date of Birth: Jan 24, 1941  [facility]) CM/SW Contact:  Illene Regulus, LCSW Phone Number: 11/23/2022, 9:50 AM   Clinical Narrative:   Pt is recommended for home O2 and a nebulizer , CSW sent referral to [facility] with [place] [facility] will be delivered to pt's room prior to d/c . Per TOC note on 11/20/22 pt declined [place] services. No additional needs.   [name] CSW received a message from pt's nurse regarding some concerns the pt has at home. CSW spoke with pt, he reported not having food and central heat in his home, however, he stated he does have a space heater. CSW offered resources for food assistance pt declined to accept. Pt stated once he leaves he can set that up for himself. Pt stated he needed a ride to get his car at the urgent care. Pt reported he is waiting for his friend to call him back. Pt reported his friend may be able to arrange to pick him up after 3:00 pm. CSW informed pt the hospital can give him a ride to his car. Pt reported wanting to wait on his friend.   Final next level of care: Home/Self Care Barriers to Discharge: No Barriers Identified   Patient Goals and CMS Choice CMS Medicare.gov Compare Post Acute Care list provided to:: Patient Choice offered to / list presented to : Patient  Discharge Placement                         Discharge Plan and Services Additional resources added to the After Visit Summary for   In-house Referral: NA Discharge Planning Services: NA Post Acute Care Choice: NA          DME Arranged: N/A DME Agency: NA                  Social Determinants of Health (SDOH) Interventions SDOH Screenings   Food Insecurity: No Food Insecurity (11/14/2022)  Housing: Low Risk  (11/14/2022)  Transportation Needs: No Transportation Needs (11/14/2022)  Utilities: Not At Risk (11/14/2022)  Depression (PHQ2-9): Medium  Risk (08/09/2020)  Tobacco Use: Low Risk  (11/13/2022)     Readmission Risk Interventions    11/20/2022   12:16 PM 11/14/2022    1:18 PM 05/07/2022   11:55 AM  Readmission Risk Prevention Plan  Transportation Screening  Complete Complete  PCP or Specialist Appt within 5-7 Days   Complete  PCP or Specialist Appt within 3-5 Days  Complete   Home Care Screening   Complete  Medication Review (RN CM)   Complete  HRI or Home Care Consult  Complete   Social Work Consult for [place] Planning/Counseling  Complete   Palliative Care Screening  Not Applicable   Medication Review Press photographer)  Complete   PCP or Specialist appointment within 3-5 days of discharge Complete    HRI or [place] Complete    SW Recovery Care/Counseling Consult Complete    [place] Not Applicable

## 2022-11-23 NOTE — Discharge Summary (Signed)
Physician Discharge Summary   Patient: [name] MRN: [id number] DOB: Aug 19, 1941  Admit date:     11/12/2022  Discharge date: 11/23/22  Discharge Physician: Edwin Dada   PCP: Cari Caraway, MD     Recommendations at discharge:  Follow up with PCP [name] in 1 week for RSV, influenza, asthma flare and new O2 requirement Follow up with Dr. Vaughan Browner, Pulmonology in 1 month [name]: Please check CBC and BMP in 1 week Please obtain CT chest in 12 months to evaluate lung nodules Dr. Vaughan Browner: Please reasses O2 needs and wean as able     Discharge Diagnoses: Principal Problem:   Influenza A with pneumonia Active Problems:   Acute respiratory failure with hypoxia (HCC)   Mild persistent asthma with exacerbation   Stage 3b chronic kidney disease (CKD) (HCC)   Dyslipidemia   Obesity (BMI 30-39.9)   OSA (obstructive sleep apnea)   Pulmonary nodules   Blood in stool, likely hemorrhoids     Hospital Course: [name] is an 82 y.o. M with obesity, OSA on CPAP, CKD IIIb, and asthma who presented with cough and dyspnea.    Assessment and Plan: * Influenza A with pneumonia Respiratory synctial virus Acute respiratory failure with hypoxia ([name]) Acute asthma flare Patient presented with cough, dyspnea, required 4L supplemental O2 to maintain O2 sats.  This was likely multifactorial due to RSV, influenza, age, obesity, asthma.  Admitted on O2, RSV and influenza +, started on Tamiflu, completed course.  Not improving, and noted wheezing, so started on steroids on HD 3.    Still not improving at HD 6, and so antibiotics started for suspected bacterial superinfection.  Slowly weaned O2 from there, and stable last 3 days on room air at rest, but intermittently hypoxic at rest and with exertion, and so discharged on new supplemental O2.  Unclear if this will be tapered as an outpatient or will be lifelong.  Discharged with 4 more days steroid taper, new  nebulizer.  Needs PCP and Pulm follow up.        Blood in stool This was noted incidentally, not observed but nursing.  Hgb remained stable, likely hemorrhoids, no further hematochezia.  Pulmonary nodules Incidental finding. - Follow up imaging at appropriate interval, repeat noncontrast CT at 12 months  OSA (obstructive sleep apnea) - Continue CPAP  Obesity (BMI 30-39.9) BMI 37   Dyslipidemia On Crestor  Stage 3b chronic kidney disease (CKD) (HCC) Cr stable relative to baseline 1.6            The [facility] Controlled Substances Registry was reviewed for this patient prior to discharge.   Consultants: None Procedures performed: CT chest  Disposition: Home with home health   DISCHARGE MEDICATION: Allergies as of 11/23/2022   No Known Allergies      Medication List     STOP taking these medications    cephALEXin 500 MG capsule Commonly known as: KEFLEX       TAKE these medications    albuterol 108 (90 Base) MCG/ACT inhaler Commonly known as: VENTOLIN HFA Inhale 2 puffs into the lungs every 6 (six) hours as needed for wheezing or shortness of breath. What changed: Another medication with the same name was added. Make sure you understand how and when to take each.   albuterol (2.5 MG/3ML) 0.083% nebulizer solution Commonly known as: PROVENTIL Take 3 mLs (2.5 mg total) by nebulization every 4 (four) hours as needed for wheezing or shortness of breath. What  changed: You were already taking a medication with the same name, and this prescription was added. Make sure you understand how and when to take each.   Astepro 0.15 % Soln Generic drug: Azelastine HCl Place 2 sprays into both nostrils in the morning.   benzonatate 100 MG capsule Commonly known as: TESSALON Take 100 mg by mouth 3 (three) times daily as needed for cough.   Breztri Aerosphere 160-9-4.8 MCG/ACT Aero Generic drug: Budeson-Glycopyrrol-Formoterol Inhale 2 puffs into the lungs  in the morning and at bedtime.   buPROPion 150 MG 24 hr tablet Commonly known as: WELLBUTRIN XL Take 450 mg by mouth every morning.   CALCIUM PO Take 1 tablet by mouth every morning.   docusate sodium 100 MG capsule Commonly known as: COLACE Take 1 capsule (100 mg total) by mouth 2 (two) times daily. What changed:  when to take this reasons to take this   doxazosin 8 MG tablet Commonly known as: CARDURA TAKE 1 TABLET BY MOUTH DAILY  . Take at night What changed:  how much to take how to take this when to take this additional instructions   Flonase Allergy Relief 50 MCG/ACT nasal spray Generic drug: fluticasone Place 2 sprays into both nostrils every evening.   furosemide 40 MG tablet Commonly known as: LASIX Take 40 mg by mouth See admin instructions. Take 40 mg by mouth in the morning and an additional 40 mg once a day as needed for unresolved swelling or fluid retention   gabapentin 300 MG capsule Commonly known as: NEURONTIN TAKE 1 TO 2 CAPSULES BY MOUTH AT BEDTIME What changed: See the new instructions.   Glucosamine Chondroitin Triple Tabs Take 1 tablet by mouth in the morning and at bedtime.   HYDROcodone-acetaminophen 5-325 MG tablet Commonly known as: Norco Take 1-2 tablets by mouth every 4 (four) hours as needed for moderate pain (Use sparingly). What changed:  how much to take when to take this   methocarbamol 500 MG tablet Commonly known as: ROBAXIN Take 1 tablet (500 mg total) by mouth every 8 (eight) hours as needed for muscle spasms. What changed: when to take this   mirtazapine 15 MG tablet Commonly known as: REMERON Take 15 mg by mouth at bedtime.   montelukast 10 MG tablet Commonly known as: SINGULAIR Take 1 tablet (10 mg total) by mouth at bedtime.   multivitamin with minerals Tabs tablet Take 1 tablet by mouth every morning.   NexIUM 24HR 20 MG capsule Generic drug: esomeprazole Take 20 mg by mouth See admin instructions. Take 20 mg  by mouth in the morning 30 minutes before breakfast and an additional 20 mg once a day as needed for continued heartburn   potassium chloride SA 20 MEQ tablet Commonly known as: KLOR-CON M Take 1 tablet (20 mEq total) by mouth 2 (two) times daily.   predniSONE 10 MG tablet Commonly known as: DELTASONE Take prednisone 20 mg (two tabs) once daily for 2 days then take prednisone 10 mg (1 tab) once daily for 2 days then stop Start taking on: November 24, 2022   rosuvastatin 5 MG tablet Commonly known as: CRESTOR Take 5 mg by mouth at bedtime.   testosterone cypionate 200 MG/ML injection Commonly known as: DEPOTESTOSTERONE CYPIONATE Inject 150 mg into the muscle every Saturday.   UNKNOWN TO PATIENT Take 1 tablet by mouth See admin instructions. Unnamed urinary tablet- Take 1 tablet by mouth in the morning  Durable Medical Equipment  (From admission, onward)           Start     Ordered   11/23/22 1259  DME Oxygen  (Discharge Planning)  Once       Question Answer Comment  Length of Need 6 Months   Mode or (Route) Nasal cannula   Liters per Minute 2   Frequency Continuous (stationary and portable oxygen unit needed)   Oxygen delivery system Gas      11/23/22 1258   11/23/22 0920  For home use only DME Nebulizer machine  Once       Question Answer Comment  Patient needs a nebulizer to treat with the following condition Asthma   Length of Need Lifetime      11/23/22 0919            Follow-up Information     Cari Caraway, MD. Schedule an appointment as soon as possible for a visit in 1 week(s).   Specialty: Family Medicine Contact information: [place] [address] [phone]         Marshell Garfinkel, MD. Schedule an appointment as soon as possible for a visit in 1 month(s).   Specialty: Pulmonary Disease Contact information: [address] [place] [address] [phone]                  Discharge Instructions     Discharge instructions   Complete by: As directed    **IMPORTANT DISCHARGE INSTRUCTIONS**   From Dr. Loleta Books: You were admitted for RSV This caused a flare of your asthma, and also possibly a bacterial pneumonia You were treated with antibiotics for pneumonia and completed the course in the hospital  You were treated with steroids and your Singulair and Nexium and breathing treatments for the asthma, and this also improved  Finish steroids with 4 more days prednisone: Take prednisone 20 mg Mon and Tues, then take prednisone 10 mg Weds and Thurs  Also, I recommend you use albuterol three times daily on schedule for the next week (either in the pump, proventil) or in the nebulizer  After a week, you can reduce to using as needed.  Go see [name] in 1 week   If you have any lingering cough, you should take the cough syrup we gave you here, Robitussin (with the ingredients "GUIAFENESIN" and "DEXTROMETHORPHAN")  The purpose of the  oxygen is to keep your oxygen level at 88% or above  While you are at rest, you do not necessarily need to use it, if your oxygen level is greater than 88% You should use it will walking around  I am not certain if this is a temporary thing, or if you will need oxygen lifelong.  [name] and Dr. Vaughan Browner will have to follow up and see in a week to few weeks.  You should purchase a pulse oximeter at your pharmacy. This is a device that you put on your finger to measure your oxygen level.  They are available at any pharmacy. Use it to check your oxygen if you feel tired. If your oxygen level is ever LESS than 88% and doesn't get better despite wearing your home oxygen, you should call your primary care doctor immediately.   Increase activity slowly   Complete by: As directed        Discharge Exam: Filed Weights   11/14/22 1604 11/22/22 0612  Weight: 116.2 kg 59.6 kg    General: Pt  is alert, awake, not in acute  distress Cardiovascular: RRR, nl S1-S2, no murmurs appreciated.   No LE edema.   Respiratory: Normal respiratory rate and rhythm.  CTAB without rales or wheezes. Abdominal: Abdomen soft and non-tender.  No distension or HSM.   Neuro/Psych: Strength symmetric in upper and lower extremities.  Judgment and insight appear normal.   Condition at discharge: stable  The results of significant diagnostics from this hospitalization (including imaging, microbiology, ancillary and laboratory) are listed below for reference.   Imaging Studies: DG Chest 2 View  Result Date: 11/15/2022 CLINICAL DATA:  Cough. EXAM: CHEST - 2 VIEW COMPARISON:  Chest x-ray November 12, 2022.  Chest CT November 12, 2022. FINDINGS: Basilar opacity is best seen on the lateral view and stable. Mild increased opacity in the lateral left base on the frontal view compared to yesterday's study. The cardiomediastinal silhouette is stable. No pneumothorax. No overt edema. No other acute abnormalities. IMPRESSION: Bibasilar infiltrates are stable on the lateral view and slightly increase on the left on the frontal view. Recommend follow-up to resolution. Electronically Signed   By: Dorise Bullion III M.D.   On: 11/15/2022 10:46   CT Chest Wo Contrast  Result Date: 11/12/2022 CLINICAL DATA:  Pneumonia, complication suspected. Cough, congestion, low O2. EXAM: CT CHEST WITHOUT CONTRAST TECHNIQUE: Multidetector CT imaging of the chest was performed following the standard protocol without IV contrast. RADIATION DOSE REDUCTION: This exam was performed according to the departmental dose-optimization program which includes automated exposure control, adjustment of the mA and/or kV according to patient size and/or use of iterative reconstruction technique. COMPARISON:  01/27/2022. FINDINGS: Cardiovascular: Heart is normal in size and there is a trace pericardial effusion. Three-vessel coronary artery calcifications are noted. There is atherosclerotic  calcification of the aorta without evidence of aneurysm. The pulmonary trunk is normal in caliber. Mediastinum/Nodes: Prominent lymph nodes are noted in the mediastinum measuring up to 1.3 cm in the pretracheal space, likely reactive. No axillary lymphadenopathy. Evaluation of the hila is limited due to lack of IV contrast. The thyroid gland, trachea, and esophagus are within normal limits. There is a small hiatal hernia. Lungs/Pleura: Bronchial wall thickening is noted in the lower lobes bilaterally with patchy airspace disease in the lower lobes. No effusion or pneumothorax. There is a stable 5 mm nodule in the right middle lobe, axial image 84. A 3 mm nodule is noted in the right middle lobe, axial image 83. Upper Abdomen: No acute abnormality. Musculoskeletal: Severe degenerative changes are noted at the left glenohumeral joint. There are degenerative changes in the thoracic spine. No acute or suspicious osseous abnormality. IMPRESSION: 1. Bilateral lower lobe bronchial wall thickening with mild patchy airspace disease at the lung bases, possible atelectasis or infiltrate. 2. Right middle lobe pulmonary nodules measuring up to 5 mm. No follow-up needed if patient is low-risk (and has no known or suspected primary neoplasm). Non-contrast chest CT can be considered in 12 months if patient is high-risk. This recommendation follows the consensus statement: Guidelines for Management of Incidental Pulmonary Nodules Detected on CT Images: From the Fleischner Society 2017; Radiology 2017; 284:228-243. 3. Coronary artery calcifications. 4. Aortic atherosclerosis. Electronically Signed   By: Brett Fairy M.D.   On: 11/12/2022 21:08   DG Chest 2 View  Result Date: 11/12/2022 CLINICAL DATA:  Shortness of breath, cough EXAM: CHEST - 2 VIEW COMPARISON:  10/15/2022 FINDINGS: Cardiac size is within normal limits. There are no signs of pulmonary edema or focal pulmonary consolidation. Patient's  chin is partly obscuring both  apices limiting evaluation. Costophrenic angles are clear. There is no evidence of any significant pneumothorax. Degenerative changes are noted in left shoulder. IMPRESSION: There are no signs of pulmonary edema or focal pulmonary consolidation. Evaluation of apices is limited by patient's chin. Electronically Signed   By: Elmer Picker M.D.   On: 11/12/2022 18:17    Microbiology: Results for orders placed or performed during the hospital encounter of 11/12/22  Resp panel by RT-PCR (RSV, Flu A&B, Covid) Anterior Nasal Swab     Status: Abnormal   Collection Time: 11/12/22  8:33 PM   Specimen: Anterior Nasal Swab  Result Value Ref Range Status   SARS Coronavirus 2 by RT PCR NEGATIVE NEGATIVE Final    Comment: (NOTE) SARS-CoV-2 target nucleic acids are NOT DETECTED.  The SARS-CoV-2 RNA is generally detectable in upper respiratory specimens during the acute phase of infection. The lowest concentration of SARS-CoV-2 viral copies this assay can detect is 138 copies/mL. A negative result does not preclude SARS-Cov-2 infection and should not be used as the sole basis for treatment or other patient management decisions. A negative result may occur with  improper specimen collection/handling, submission of specimen other than nasopharyngeal swab, presence of viral mutation(s) within the areas targeted by this assay, and inadequate number of viral copies(<138 copies/mL). A negative result must be combined with clinical observations, patient history, and epidemiological information. The expected result is Negative.  Fact Sheet for Patients:  EntrepreneurPulse.com.au  Fact Sheet for Healthcare Providers:  IncredibleEmployment.be  This test is no t yet approved or cleared by the [place] FDA and  has been authorized for detection and/or diagnosis of SARS-CoV-2 by FDA under an Emergency Use Authorization (EUA). This EUA will remain  in effect (meaning  this test can be used) for the duration of the COVID-19 declaration under Section 564(b)(1) of the Act, 21 U.S.C.section 360bbb-3(b)(1), unless the authorization is terminated  or revoked sooner.       Influenza A by PCR POSITIVE (A) NEGATIVE Final   Influenza B by PCR NEGATIVE NEGATIVE Final    Comment: (NOTE) The Xpert Xpress SARS-CoV-2/FLU/RSV plus assay is intended as an aid in the diagnosis of influenza from Nasopharyngeal swab specimens and should not be used as a sole basis for treatment. Nasal washings and aspirates are unacceptable for Xpert Xpress SARS-CoV-2/FLU/RSV testing.  Fact Sheet for Patients: EntrepreneurPulse.com.au  Fact Sheet for Healthcare Providers: IncredibleEmployment.be  This test is not yet approved or cleared by the [place] FDA and has been authorized for detection and/or diagnosis of SARS-CoV-2 by FDA under an Emergency Use Authorization (EUA). This EUA will remain in effect (meaning this test can be used) for the duration of the COVID-19 declaration under Section 564(b)(1) of the Act, 21 U.S.C. section 360bbb-3(b)(1), unless the authorization is terminated or revoked.     Resp Syncytial Virus by PCR POSITIVE (A) NEGATIVE Final    Comment: (NOTE) Fact Sheet for Patients: EntrepreneurPulse.com.au  Fact Sheet for Healthcare Providers: IncredibleEmployment.be  This test is not yet approved or cleared by the [place] FDA and has been authorized for detection and/or diagnosis of SARS-CoV-2 by FDA under an Emergency Use Authorization (EUA). This EUA will remain in effect (meaning this test can be used) for the duration of the COVID-19 declaration under Section 564(b)(1) of the Act, 21 U.S.C. section 360bbb-3(b)(1), unless the authorization is terminated or revoked.  Performed at [facility], [place] [address], [place], [place] [address]    *  Note:  Due to a large number of results and/or encounters for the requested time period, some results have not been displayed. A complete set of results can be found in Results Review.    Labs: CBC: Recent Labs  Lab 11/18/22 0558 11/20/22 0633  WBC 12.5* 12.3*  NEUTROABS 9.9*  --   HGB 12.1* 12.3*  HCT 36.0* 37.9*  MCV 99.2 100.0  PLT 175 161   Basic Metabolic Panel: Recent Labs  Lab 11/17/22 0607 11/18/22 0558 11/20/22 0633  NA 139 138 141  K 3.9 4.2 3.6  CL 103 102 103  CO2 '27 26 28  '$ GLUCOSE 175* 178* 107*  BUN 38* 39* 41*  CREATININE 1.64* 1.66* 1.53*  CALCIUM 8.5* 8.5* 8.4*  MG  --  2.1  --   PHOS  --  4.2  --    Liver Function Tests: No results for input(s): "AST", "ALT", "ALKPHOS", "BILITOT", "PROT", "ALBUMIN" in the last 168 hours. CBG: No results for input(s): "GLUCAP" in the last 168 hours.  Discharge time spent: approximately 35 minutes spent on discharge counseling, evaluation of patient on day of discharge, and coordination of discharge planning with nursing, social work, pharmacy and case management  Signed: Edwin Dada, MD [facility] 11/23/2022

## 2022-11-23 NOTE — Progress Notes (Signed)
Patient has been stable on room air at rest for last 3 days since 1/12.  With exertion, he intermittently needs O2 but these needs appear to have stabilized.  My suspicion is that given his underlying asthma, obesity hypoventilation, and age, he may need lifelong O2.    He has close PCP follow up and is established with Pulmonology.  Will complete prednisone taper.  Have provided home nebulizer as well.  Patient worked with PT, whose evaluation included his home environment, and it was judged that he could safely physically navigate his home.  I verified directly with patient that he has power to his home to power an O2 concentrator.  He has friends and family available for assistance and transport.

## 2022-11-24 ENCOUNTER — Telehealth: Payer: Self-pay | Admitting: Cardiovascular Disease

## 2022-11-24 NOTE — Telephone Encounter (Signed)
Will route to MD to make aware.   Thanks!

## 2022-11-24 NOTE — Telephone Encounter (Signed)
Patient just discharged from the hospital yesterday after a 12 day stay. He would like Dr. Gwenlyn Found to review his notes.

## 2022-11-24 NOTE — Telephone Encounter (Signed)
Attempted to contact patient, LVM to call back.  Left call back number.

## 2022-11-28 DIAGNOSIS — J9691 Respiratory failure, unspecified with hypoxia: Secondary | ICD-10-CM | POA: Diagnosis not present

## 2022-11-28 DIAGNOSIS — B338 Other specified viral diseases: Secondary | ICD-10-CM | POA: Diagnosis not present

## 2022-11-28 DIAGNOSIS — R29898 Other symptoms and signs involving the musculoskeletal system: Secondary | ICD-10-CM | POA: Diagnosis not present

## 2022-11-28 DIAGNOSIS — L03115 Cellulitis of right lower limb: Secondary | ICD-10-CM | POA: Diagnosis not present

## 2022-11-28 DIAGNOSIS — I87391 Chronic venous hypertension (idiopathic) with other complications of right lower extremity: Secondary | ICD-10-CM | POA: Diagnosis not present

## 2022-11-28 DIAGNOSIS — I890 Lymphedema, not elsewhere classified: Secondary | ICD-10-CM | POA: Diagnosis not present

## 2022-11-28 DIAGNOSIS — J4541 Moderate persistent asthma with (acute) exacerbation: Secondary | ICD-10-CM | POA: Diagnosis not present

## 2022-11-28 DIAGNOSIS — Z6841 Body Mass Index (BMI) 40.0 and over, adult: Secondary | ICD-10-CM | POA: Diagnosis not present

## 2022-11-28 DIAGNOSIS — J189 Pneumonia, unspecified organism: Secondary | ICD-10-CM | POA: Diagnosis not present

## 2022-11-28 DIAGNOSIS — N1832 Chronic kidney disease, stage 3b: Secondary | ICD-10-CM | POA: Diagnosis not present

## 2022-11-28 DIAGNOSIS — J101 Influenza due to other identified influenza virus with other respiratory manifestations: Secondary | ICD-10-CM | POA: Diagnosis not present

## 2022-12-02 DIAGNOSIS — D692 Other nonthrombocytopenic purpura: Secondary | ICD-10-CM | POA: Diagnosis not present

## 2022-12-02 DIAGNOSIS — I890 Lymphedema, not elsewhere classified: Secondary | ICD-10-CM | POA: Diagnosis not present

## 2022-12-02 DIAGNOSIS — Z6841 Body Mass Index (BMI) 40.0 and over, adult: Secondary | ICD-10-CM | POA: Diagnosis not present

## 2022-12-02 DIAGNOSIS — G4733 Obstructive sleep apnea (adult) (pediatric): Secondary | ICD-10-CM | POA: Diagnosis not present

## 2022-12-02 DIAGNOSIS — R262 Difficulty in walking, not elsewhere classified: Secondary | ICD-10-CM | POA: Diagnosis not present

## 2022-12-02 DIAGNOSIS — R5382 Chronic fatigue, unspecified: Secondary | ICD-10-CM | POA: Diagnosis not present

## 2022-12-03 ENCOUNTER — Encounter (HOSPITAL_BASED_OUTPATIENT_CLINIC_OR_DEPARTMENT_OTHER): Payer: Medicare Other | Admitting: General Surgery

## 2022-12-03 NOTE — Telephone Encounter (Signed)
Called patient, LVM that Dr.Berry had reviewed notes. No changes recommended.  Keep appointment on 02/07

## 2022-12-06 DIAGNOSIS — L03116 Cellulitis of left lower limb: Secondary | ICD-10-CM | POA: Diagnosis not present

## 2022-12-06 DIAGNOSIS — S71102A Unspecified open wound, left thigh, initial encounter: Secondary | ICD-10-CM | POA: Diagnosis not present

## 2022-12-08 DIAGNOSIS — Z79899 Other long term (current) drug therapy: Secondary | ICD-10-CM | POA: Diagnosis not present

## 2022-12-08 DIAGNOSIS — I890 Lymphedema, not elsewhere classified: Secondary | ICD-10-CM | POA: Diagnosis not present

## 2022-12-08 DIAGNOSIS — N1832 Chronic kidney disease, stage 3b: Secondary | ICD-10-CM | POA: Diagnosis not present

## 2022-12-08 DIAGNOSIS — R600 Localized edema: Secondary | ICD-10-CM | POA: Diagnosis not present

## 2022-12-08 DIAGNOSIS — L03116 Cellulitis of left lower limb: Secondary | ICD-10-CM | POA: Diagnosis not present

## 2022-12-08 DIAGNOSIS — Z6841 Body Mass Index (BMI) 40.0 and over, adult: Secondary | ICD-10-CM | POA: Diagnosis not present

## 2022-12-10 DIAGNOSIS — R2689 Other abnormalities of gait and mobility: Secondary | ICD-10-CM | POA: Diagnosis not present

## 2022-12-10 DIAGNOSIS — M25552 Pain in left hip: Secondary | ICD-10-CM | POA: Diagnosis not present

## 2022-12-10 DIAGNOSIS — M79671 Pain in right foot: Secondary | ICD-10-CM | POA: Diagnosis not present

## 2022-12-10 DIAGNOSIS — R262 Difficulty in walking, not elsewhere classified: Secondary | ICD-10-CM | POA: Diagnosis not present

## 2022-12-16 ENCOUNTER — Other Ambulatory Visit: Payer: Self-pay | Admitting: Physical Medicine and Rehabilitation

## 2022-12-16 ENCOUNTER — Telehealth: Payer: Self-pay | Admitting: Physical Medicine and Rehabilitation

## 2022-12-16 DIAGNOSIS — M5416 Radiculopathy, lumbar region: Secondary | ICD-10-CM

## 2022-12-16 DIAGNOSIS — M48062 Spinal stenosis, lumbar region with neurogenic claudication: Secondary | ICD-10-CM

## 2022-12-16 NOTE — Telephone Encounter (Signed)
Patient wants to schedule for injections [phone]

## 2022-12-16 NOTE — Telephone Encounter (Signed)
Spoke with patient and he is requesting an injection. He is having the same pain as before in 08/2022. Last injection lasted about 3 months. No new injury, falls or accidents. Please advise

## 2022-12-17 ENCOUNTER — Encounter: Payer: Self-pay | Admitting: Cardiovascular Disease

## 2022-12-17 ENCOUNTER — Ambulatory Visit: Payer: Medicare Other | Attending: Cardiovascular Disease | Admitting: Cardiovascular Disease

## 2022-12-17 VITALS — BP 130/80 | HR 77 | Ht 69.0 in | Wt 279.0 lb

## 2022-12-17 DIAGNOSIS — R0609 Other forms of dyspnea: Secondary | ICD-10-CM

## 2022-12-17 DIAGNOSIS — E782 Mixed hyperlipidemia: Secondary | ICD-10-CM

## 2022-12-17 DIAGNOSIS — R2689 Other abnormalities of gait and mobility: Secondary | ICD-10-CM | POA: Diagnosis not present

## 2022-12-17 DIAGNOSIS — G4730 Sleep apnea, unspecified: Secondary | ICD-10-CM

## 2022-12-17 DIAGNOSIS — R262 Difficulty in walking, not elsewhere classified: Secondary | ICD-10-CM | POA: Diagnosis not present

## 2022-12-17 DIAGNOSIS — M25552 Pain in left hip: Secondary | ICD-10-CM | POA: Diagnosis not present

## 2022-12-17 DIAGNOSIS — R609 Edema, unspecified: Secondary | ICD-10-CM

## 2022-12-17 DIAGNOSIS — M79671 Pain in right foot: Secondary | ICD-10-CM | POA: Diagnosis not present

## 2022-12-17 MED ORDER — FUROSEMIDE 40 MG PO TABS: 40.0000 mg | ORAL_TABLET | Freq: Every day | ORAL | 3 refills | Status: DC

## 2022-12-17 NOTE — Patient Instructions (Signed)
Medication Instructions:  Your physician has recommended you make the following change in your medication:   -Increase furosemide (lasix) to '40mg'$  twice daily for 7 days then resume once daily.   *If you need a refill on your cardiac medications before your next appointment, please call your pharmacy*   Lab Work: Your physician recommends that you return for lab work in: 14 days for BMET  If you have labs (blood work) drawn today and your tests are completely normal, you will receive your results only by: Veteran (if you have MyChart) OR A paper copy in the mail If you have any lab test that is abnormal or we need to change your treatment, we will call you to review the results.   Testing/Procedures: Your physician has requested that you have an echocardiogram. Echocardiography is a painless test that uses sound waves to create images of your heart. It provides your doctor with information about the size and shape of your heart and how well your heart's chambers and valves are working. This procedure takes approximately one hour. There are no restrictions for this procedure. Please do NOT wear cologne, perfume, aftershave, or lotions (deodorant is allowed). Please arrive 15 minutes prior to your appointment time. This procedure will be done at [address]    Follow-Up: At [facility], you and your health needs are our priority.  As part of our continuing mission to provide you with exceptional heart care, we have created designated Provider Care Teams.  These Care Teams include your primary Cardiologist (physician) and Advanced Practice Providers (APPs -  Physician Assistants and Nurse Practitioners) who all work together to provide you with the care you need, when you need it.  We recommend signing up for the patient portal called "MyChart".  Sign up information is provided on this After Visit Summary.  MyChart is used to connect with patients for Virtual  Visits (Telemedicine).  Patients are able to view lab/test results, encounter notes, upcoming appointments, etc.  Non-urgent messages can be sent to your provider as well.   To learn more about what you can do with MyChart, go to [email or web address].    Your next appointment:   3 month(s)  Provider:   Fabian Sharp, PA-C, Sande Rives, PA-C, Caron Presume, PA-C, Jory Sims, DNP, ANP, Almyra Deforest, PA-C, or Diona Browner, NP      Then, Quay Burow, MD will plan to see you again in 12 month(s).

## 2022-12-17 NOTE — Assessment & Plan Note (Signed)
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  He is on furosemide once a day.  His right leg is wrapped and he does have 1-2+ left lower extremity edema.  I am going to repeat a 2D echo, and increase his furosemide to twice daily for a week and then back to once a day after that.

## 2022-12-17 NOTE — Assessment & Plan Note (Signed)
History of obstructive sleep apnea on CPAP. 

## 2022-12-17 NOTE — Progress Notes (Signed)
12/17/2022 [name]   05-01-1941  [id number]  Primary Physician Cari Caraway, MD Primary Cardiologist: Lorretta Harp MD Lupe Carney, [place]  HPI:  [name] is a 82 y.o.    moderately overweight divorced Caucasian male father of 3 living children (1 daughter committed suicide since I saw him last) who is a patient of Dr. Everlene Farrier and was referred back for evaluation of dyspnea. I last saw him in the office   06/15/2020. His cardiac risk factor profile is notable for mild hyperlipidemia and family history the father who had an open heart surgery in his 62s. Because of chest pain and a mildly abnormal Cardiolite I performed cardiac catheterization on him 8//7/08 which was entirely normal. He does have problems with his back and knee. He's had venous stripping by Dr. Donnetta Hutching for reflux and swelling last year. He's been diagnosed with obstructive sleep apnea where C Pap which is beneficial for him.  He does not exercise because of physical limitations regarding his back and knees.   He  continues to be limited by dyspnea as well as joint pain.  His last 2D echo performed 08/10/2019 revealed normal LV systolic function with grade 1 diastolic dysfunction.  He really denies chest pain.  There is no history of tobacco abuse.  He does have very somatic complaints of feeling diaphoretic and having a slow heart rate in the morning but I do not think these are necessarily relevant.  He was admitted to the hospital with pneumonia last month and was discharged approxi-3 weeks ago.  He had influenza, RSV and asthmatic flare he was discharged on oxygen but he has not used it since.  He denies chest pain but is chronically dyspneic.  Does have lower extremity edema on furosemide.   Current Meds  Medication Sig   albuterol (PROVENTIL) (2.5 MG/3ML) 0.083% nebulizer solution Take 3 mLs (2.5 mg total) by nebulization every 4 (four) hours as needed for wheezing or shortness of breath.   albuterol  (VENTOLIN HFA) 108 (90 Base) MCG/ACT inhaler Inhale 2 puffs into the lungs every 6 (six) hours as needed for wheezing or shortness of breath.   ASTEPRO 205.5 MCG/SPRAY SOLN Place 2 sprays into both nostrils in the morning.   benzonatate (TESSALON) 100 MG capsule Take 100 mg by mouth 3 (three) times daily as needed for cough.   Budeson-Glycopyrrol-Formoterol (BREZTRI AEROSPHERE) 160-9-4.8 MCG/ACT AERO Inhale 2 puffs into the lungs in the morning and at bedtime.   buPROPion (WELLBUTRIN XL) 150 MG 24 hr tablet Take 450 mg by mouth every morning.   CALCIUM PO Take 1 tablet by mouth every morning.   docusate sodium (COLACE) 100 MG capsule Take 1 capsule (100 mg total) by mouth 2 (two) times daily. (Patient taking differently: Take 100 mg by mouth 2 (two) times daily as needed for mild constipation.)   doxazosin (CARDURA) 8 MG tablet TAKE 1 TABLET BY MOUTH DAILY  . Take at night (Patient taking differently: Take 8 mg by mouth at bedtime.)   esomeprazole (NEXIUM 24HR) 20 MG capsule Take 20 mg by mouth See admin instructions. Take 20 mg by mouth in the morning 30 minutes before breakfast and an additional 20 mg once a day as needed for continued heartburn   fluticasone (FLONASE ALLERGY RELIEF) 50 MCG/ACT nasal spray Place 2 sprays into both nostrils every evening.   furosemide (LASIX) 40 MG tablet Take 40 mg by mouth See admin instructions. Take 40 mg by mouth  in the morning and an additional 40 mg once a day as needed for unresolved swelling or fluid retention   gabapentin (NEURONTIN) 300 MG capsule TAKE 1 TO 2 CAPSULES BY MOUTH AT BEDTIME (Patient taking differently: Take 600 mg by mouth at bedtime.)   HYDROcodone-acetaminophen (NORCO) 5-325 MG tablet Take 1-2 tablets by mouth every 4 (four) hours as needed for moderate pain (Use sparingly). (Patient taking differently: Take 1 tablet by mouth in the morning and at bedtime.)   methocarbamol (ROBAXIN) 500 MG tablet Take 1 tablet (500 mg total) by mouth every 8  (eight) hours as needed for muscle spasms. (Patient taking differently: Take 500 mg by mouth in the morning and at bedtime.)   mirtazapine (REMERON) 15 MG tablet Take 15 mg by mouth at bedtime.   Misc Natural Products (GLUCOSAMINE CHONDROITIN TRIPLE) TABS Take 1 tablet by mouth in the morning and at bedtime.   montelukast (SINGULAIR) 10 MG tablet Take 1 tablet (10 mg total) by mouth at bedtime.   Multiple Vitamin (MULTIVITAMIN WITH MINERALS) TABS tablet Take 1 tablet by mouth every morning.   potassium chloride SA (K-DUR,KLOR-CON) 20 MEQ tablet Take 1 tablet (20 mEq total) by mouth 2 (two) times daily.   predniSONE (DELTASONE) 10 MG tablet Take prednisone 20 mg (two tabs) once daily for 2 days then take prednisone 10 mg (1 tab) once daily for 2 days then stop   rosuvastatin (CRESTOR) 5 MG tablet Take 5 mg by mouth at bedtime.   testosterone cypionate (DEPOTESTOSTERONE CYPIONATE) 200 MG/ML injection Inject 150 mg into the muscle every Saturday.   UNKNOWN TO PATIENT Take 1 tablet by mouth See admin instructions. Unnamed urinary tablet- Take 1 tablet by mouth in the morning     No Known Allergies  Social History   Socioeconomic History   Marital status: Single    Spouse name: Not on file   Number of children: Not on file   Years of education: Not on file   Highest education level: Not on file  Occupational History   Occupation: Antiques/Real Estate    Employer: RETIRED  Tobacco Use   Smoking status: Never   Smokeless tobacco: Never  Vaping Use   Vaping Use: Never used  Substance and Sexual Activity   Alcohol use: Yes    Comment: rare   Drug use: No   Sexual activity: Yes    Birth control/protection: Condom  Other Topics Concern   Not on file  Social History Narrative   ** Merged History Encounter **       Single. Education: Other.    Social Determinants of Health   Financial Resource Strain: Not on file  Food Insecurity: No Food Insecurity (11/14/2022)   Hunger Vital Sign     Worried About Running Out of Food in the Last Year: Never true    Ran Out of Food in the Last Year: Never true  Transportation Needs: No Transportation Needs (11/14/2022)   PRAPARE - Hydrologist (Medical): No    Lack of Transportation (Non-Medical): No  Physical Activity: Not on file  Stress: Not on file  Social Connections: Not on file  Intimate Partner Violence: Not At Risk (11/14/2022)   Humiliation, Afraid, Rape, and Kick questionnaire    Fear of Current or Ex-Partner: No    Emotionally Abused: No    Physically Abused: No    Sexually Abused: No     Review of Systems: General: negative for chills, fever, night sweats  or weight changes.  Cardiovascular: negative for chest pain, dyspnea on exertion, edema, orthopnea, palpitations, paroxysmal nocturnal dyspnea or shortness of breath Dermatological: negative for rash Respiratory: negative for cough or wheezing Urologic: negative for hematuria Abdominal: negative for nausea, vomiting, diarrhea, bright red blood per rectum, melena, or hematemesis Neurologic: negative for visual changes, syncope, or dizziness All other systems reviewed and are otherwise negative except as noted above.    Blood pressure 130/80, pulse 77, height '5\' 9"'$  (1.753 m), weight 279 lb (126.6 kg), SpO2 95 %.  General appearance: alert and no distress Neck: no adenopathy, no carotid bruit, no JVD, supple, symmetrical, trachea midline, and thyroid not enlarged, symmetric, no tenderness/mass/nodules Lungs: clear to auscultation bilaterally Heart: regular rate and rhythm, S1, S2 normal, no murmur, click, rub or gallop Extremities: 1-2+ bilateral lower extremity edema. Pulses: 2+ and symmetric Skin: Skin color, texture, turgor normal. No rashes or lesions Neurologic: Grossly normal  EKG sinus rhythm at 77 with nonspecific ST and T wave changes.  I personally reviewed this EKG.  ASSESSMENT AND PLAN:   Hyperlipidemia History of  hyperlipidemia on low-dose statin therapy with lipid profile performed 12/31/2020 revealing total cholesterol 124, LDL 56 and HDL 46.  Sleep apnea History of obstructive sleep apnea on CPAP  Dyspnea on exertion History of chronic dyspnea on exertion with echo performed 08/10/2019 revealing normal LV systolic function with diastolic dysfunction.  There is no significant valvular abnormality.  I am going to repeat a 2D echocardiogram.  He has no history of tobacco abuse.  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  He is on furosemide once a day.  His right leg is wrapped and he does have 1-2+ left lower extremity edema.  I am going to repeat a 2D echo, and increase his furosemide to twice daily for a week and then back to once a day after that.     Lorretta Harp MD FACP,FACC,FAHA, [facility] 12/17/2022 11:24 AM

## 2022-12-17 NOTE — Telephone Encounter (Signed)
Spoke with patient and scheduled for 12/24/22

## 2022-12-17 NOTE — Assessment & Plan Note (Signed)
History of hyperlipidemia on low-dose statin therapy with lipid profile performed 12/31/2020 revealing total cholesterol 124, LDL 56 and HDL 46.

## 2022-12-17 NOTE — Assessment & Plan Note (Signed)
History of chronic dyspnea on exertion with echo performed 08/10/2019 revealing normal LV systolic function with diastolic dysfunction.  There is no significant valvular abnormality.  I am going to repeat a 2D echocardiogram.  He has no history of tobacco abuse.

## 2022-12-18 DIAGNOSIS — L03116 Cellulitis of left lower limb: Secondary | ICD-10-CM | POA: Diagnosis not present

## 2022-12-19 DIAGNOSIS — R262 Difficulty in walking, not elsewhere classified: Secondary | ICD-10-CM | POA: Diagnosis not present

## 2022-12-19 DIAGNOSIS — I890 Lymphedema, not elsewhere classified: Secondary | ICD-10-CM | POA: Diagnosis not present

## 2022-12-19 DIAGNOSIS — M79671 Pain in right foot: Secondary | ICD-10-CM | POA: Diagnosis not present

## 2022-12-19 DIAGNOSIS — M25552 Pain in left hip: Secondary | ICD-10-CM | POA: Diagnosis not present

## 2022-12-19 DIAGNOSIS — R2689 Other abnormalities of gait and mobility: Secondary | ICD-10-CM | POA: Diagnosis not present

## 2022-12-22 ENCOUNTER — Telehealth: Payer: Self-pay | Admitting: Pulmonary Disease

## 2022-12-22 DIAGNOSIS — R262 Difficulty in walking, not elsewhere classified: Secondary | ICD-10-CM | POA: Diagnosis not present

## 2022-12-22 DIAGNOSIS — M79671 Pain in right foot: Secondary | ICD-10-CM | POA: Diagnosis not present

## 2022-12-22 DIAGNOSIS — R2689 Other abnormalities of gait and mobility: Secondary | ICD-10-CM | POA: Diagnosis not present

## 2022-12-22 DIAGNOSIS — M25552 Pain in left hip: Secondary | ICD-10-CM | POA: Diagnosis not present

## 2022-12-22 NOTE — Telephone Encounter (Signed)
spoke w pt in regard to scheduling appt , pt states he was in hospital from 1/3-1/10 and our office was suppose to have received info about him being in the hospital and we were to reach out to sch an appt sooner. He mentions that he met with his Cardiologist since getting out of the hospital (Dr.Berry). I have scheduled pt for Dr.Mannam next available. (01/02/23)   Pt wanted something sooner and wanted to be added to a wait list and I advised pt that we do not have the wait list and he could call back to check for cancellations. Pt found it "interesting" that other [facility] facilities used the wait list function but we did not.    Just a FYI.

## 2022-12-24 ENCOUNTER — Ambulatory Visit: Payer: Self-pay

## 2022-12-24 ENCOUNTER — Ambulatory Visit (INDEPENDENT_AMBULATORY_CARE_PROVIDER_SITE_OTHER): Payer: Medicare Other | Admitting: Physical Medicine and Rehabilitation

## 2022-12-24 VITALS — BP 147/91 | HR 93

## 2022-12-24 DIAGNOSIS — M5416 Radiculopathy, lumbar region: Secondary | ICD-10-CM | POA: Diagnosis not present

## 2022-12-24 DIAGNOSIS — M21371 Foot drop, right foot: Secondary | ICD-10-CM

## 2022-12-24 DIAGNOSIS — M48062 Spinal stenosis, lumbar region with neurogenic claudication: Secondary | ICD-10-CM

## 2022-12-24 MED ORDER — METHYLPREDNISOLONE ACETATE 80 MG/ML IJ SUSP
80.0000 mg | Freq: Once | INTRAMUSCULAR | Status: AC
  Administered 2022-12-24: 80 mg

## 2022-12-24 NOTE — Progress Notes (Signed)
Functional Pain Scale - descriptive words and definitions  Unmanageable (7)  Pain interferes with normal ADL's/nothing seems to help/sleep is very difficult/active distractions are very difficult to concentrate on. Severe range order  Average Pain  varies   +Driver, -BT, -Dye Allergies.   Lower back pain on both sides

## 2022-12-29 DIAGNOSIS — M25552 Pain in left hip: Secondary | ICD-10-CM | POA: Diagnosis not present

## 2022-12-29 DIAGNOSIS — M79671 Pain in right foot: Secondary | ICD-10-CM | POA: Diagnosis not present

## 2022-12-29 DIAGNOSIS — R262 Difficulty in walking, not elsewhere classified: Secondary | ICD-10-CM | POA: Diagnosis not present

## 2022-12-29 DIAGNOSIS — R2689 Other abnormalities of gait and mobility: Secondary | ICD-10-CM | POA: Diagnosis not present

## 2023-01-02 ENCOUNTER — Ambulatory Visit (INDEPENDENT_AMBULATORY_CARE_PROVIDER_SITE_OTHER): Payer: Medicare Other | Admitting: Pulmonary Disease

## 2023-01-02 ENCOUNTER — Encounter: Payer: Self-pay | Admitting: Pulmonary Disease

## 2023-01-02 VITALS — BP 126/74 | HR 86 | Temp 98.0°F | Ht 69.0 in | Wt 277.0 lb

## 2023-01-02 DIAGNOSIS — R0602 Shortness of breath: Secondary | ICD-10-CM | POA: Diagnosis not present

## 2023-01-02 DIAGNOSIS — G4730 Sleep apnea, unspecified: Secondary | ICD-10-CM

## 2023-01-02 DIAGNOSIS — J4530 Mild persistent asthma, uncomplicated: Secondary | ICD-10-CM

## 2023-01-02 NOTE — Progress Notes (Signed)
[name]    OP:[id number]    27-Jun-1941  Primary Care Physician:McNeill, Abigail Butts, MD  Referring Physician: Cari Caraway, MD [place],   [id number]   Chief complaint: Follow up for dyspnea  HPI: 82 y.o.  with history of sleep apnea, allergic rhinitis, hyperlipidemia Referred for evaluation of dyspnea.  Symptoms have been going on for the past 2 years.  He has dyspnea on exertion and sometimes at rest.  Associated with wheeze, chest tightness.  Currently on albuterol inhaler.  Previously had tried Advair but cannot tell if it made a difference.  He stopped using it as it was too expensive.  Has seasonal allergies, significant issues with GERD and is on Nexium.  History also noted for colonic polyps.  Previously followed by Dr. Benson Norway Reports seeing blood in the stool recently and intermittent right upper quadrant pain.  Follows with Dr. Gwenlyn Found, cardiology with normal cardiac catheterization in 2008.  Echocardiogram and stress test in 2018 within normal limits.  Symptoms not felt to be related to the heart.  Has bilateral lower extremity edema which is attributed to venous insufficiency.  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  Reports significant basement dampness and flooding at home with mold issues.  No hot tub, Jacuzzi, down pillows, comforters Smoking history: Never smoker Travel history: No significant travel history Relevant family history: No significant family history of lung disease  Interim history: Symbicort changed to [place] at last visit but has not made any change to his symptoms He is still having significant issues with dyspnea on exertion which comes and goes.  It is unclear as to what  is causing this.  Denies any cough, wheezing.  Outpatient Encounter Medications as of 01/02/2023  Medication Sig   albuterol (PROVENTIL) (2.5 MG/3ML) 0.083% nebulizer solution Take 3 mLs (2.5 mg total) by nebulization every 4 (four) hours as needed for wheezing or shortness of breath.   albuterol (VENTOLIN HFA) 108 (90 Base) MCG/ACT inhaler Inhale 2 puffs into the lungs every 6 (six) hours as needed for wheezing or shortness of breath.   ASTEPRO 205.5 MCG/SPRAY SOLN Place 2 sprays into both nostrils in the morning.   benzonatate (TESSALON) 100 MG capsule Take 100 mg by mouth 3 (three) times daily as needed for cough.   Budeson-Glycopyrrol-Formoterol (BREZTRI AEROSPHERE) 160-9-4.8 MCG/ACT AERO Inhale 2 puffs into the lungs in the morning and at bedtime.   buPROPion (WELLBUTRIN XL) 150 MG 24 hr tablet Take 450 mg by mouth every morning.   CALCIUM PO Take 1 tablet by mouth every morning.   docusate sodium (COLACE) 100 MG capsule Take 1 capsule (100 mg total) by mouth 2 (two) times daily. (Patient taking differently: Take 100 mg by mouth 2 (two) times daily as needed for mild constipation.)   doxazosin (CARDURA) 8 MG tablet TAKE 1 TABLET BY MOUTH DAILY  . Take at night (Patient taking differently: Take 8 mg by mouth at bedtime.)   esomeprazole (NEXIUM 24HR) 20 MG capsule Take 20 mg by mouth See admin instructions. Take 20 mg by mouth in the morning 30 minutes before breakfast and an additional 20 mg once a day as needed for continued heartburn   fluticasone (FLONASE ALLERGY  RELIEF) 50 MCG/ACT nasal spray Place 2 sprays into both nostrils every evening.   furosemide (LASIX) 40 MG tablet Take 1 tablet (40 mg total) by mouth daily. Take 40 mg by mouth in the morning and an additional 40 mg once a day as needed for unresolved swelling or fluid retention   gabapentin (NEURONTIN) 300 MG capsule TAKE 1 TO 2 CAPSULES BY MOUTH AT BEDTIME (Patient taking differently: Take 600 mg by mouth at bedtime.)    HYDROcodone-acetaminophen (NORCO) 5-325 MG tablet Take 1-2 tablets by mouth every 4 (four) hours as needed for moderate pain (Use sparingly). (Patient taking differently: Take 1 tablet by mouth in the morning and at bedtime.)   methocarbamol (ROBAXIN) 500 MG tablet Take 1 tablet (500 mg total) by mouth every 8 (eight) hours as needed for muscle spasms. (Patient taking differently: Take 500 mg by mouth in the morning and at bedtime.)   mirtazapine (REMERON) 15 MG tablet Take 15 mg by mouth at bedtime.   Misc Natural Products (GLUCOSAMINE CHONDROITIN TRIPLE) TABS Take 1 tablet by mouth in the morning and at bedtime.   montelukast (SINGULAIR) 10 MG tablet Take 1 tablet (10 mg total) by mouth at bedtime.   Multiple Vitamin (MULTIVITAMIN WITH MINERALS) TABS tablet Take 1 tablet by mouth every morning.   potassium chloride SA (K-DUR,KLOR-CON) 20 MEQ tablet Take 1 tablet (20 mEq total) by mouth 2 (two) times daily.   predniSONE (DELTASONE) 10 MG tablet Take prednisone 20 mg (two tabs) once daily for 2 days then take prednisone 10 mg (1 tab) once daily for 2 days then stop   rosuvastatin (CRESTOR) 5 MG tablet Take 5 mg by mouth at bedtime.   testosterone cypionate (DEPOTESTOSTERONE CYPIONATE) 200 MG/ML injection Inject 150 mg into the muscle every Saturday.   UNKNOWN TO PATIENT Take 1 tablet by mouth See admin instructions. Unnamed urinary tablet- Take 1 tablet by mouth in the morning   Facility-Administered Encounter Medications as of 01/02/2023  Medication   methylPREDNISolone acetate (DEPO-MEDROL) injection 80 mg    Allergies as of 01/02/2023   (No Known Allergies)   Physical Exam: Blood pressure 126/74, pulse 86, temperature 98 F (36.7 C), temperature source Oral, height '5\' 9"'$  (1.753 m), weight 277 lb (125.6 kg), SpO2 95 %. Gen:      No acute distress HEENT:  EOMI, sclera anicteric Neck:     No masses; no thyromegaly Lungs:    Clear to auscultation bilaterally; normal respiratory effort CV:          Regular rate and rhythm; no murmurs Abd:      + bowel sounds; soft, non-tender; no palpable masses, no distension Ext:    No edema; adequate peripheral perfusion Skin:      Warm and dry; no rash Neuro: alert and oriented x 3 Psych: normal mood and affect   Data Reviewed: Imaging: CTA 08/13/2018 no pulmonary embolism.  Bronchial wall thickening.  Dependent atelectasis.  CT chest 08/16/2019-mild dependent atelectasis, No evidence of interstitial lung disease. I have reviewed the images personally.  CTA 10/18/2019-no pulmonary embolism or acute disease  Chest x-ray 04/05/2021-chronic interstitial and bronchitic changes  CTA 12/02/2021-no pulmonary embolism, no acute abnormalities of the lungs.  Chest x-ray 05/07/2022-no evidence of acute cardiopulmonary abnormality.  Chest x-ray 07/02/2022-bibasal atelectasis. I have reviewed the images personally.  PFTs  11/25/2019 FVC 3.21 [82%], FEV1 2.61 [93%], F/F 81, TLC 5.93 [87%], DLCO 21.42 [90%] Normal test.  Cardiac: Echocardiogram 02/18/2017- LVEF 0000000, grade 1 diastolic dysfunction  Nuclear stress test 02/19/2017-mild diffuse hypokinesis, EF 45%.  Low risk study with no ischemia identified.   Labs: CMP 08/05/2019-significant for creatinine of 1.64, hepatic panel within normal limits CBC FasTRACT on 7/22-WBC 6.1, eos 3%, absolute eosinophil count 183 IgE 08/05/2019-89 Hypersensitivity panel 08/05/2019-negative  BNP 04/05/2021-24  SARS-CoV-2 09/06/2019-negative  Sleep PSG 12/01/2018- severe sleep apnea with AHI 90.6 desaturations to 76%. CPAP titration/2/20- recommend trial CPAP on 11  Assessment:  Mild asthma Suspect reactive airway disease, asthma, with contribution from deconditioning, body habitus.  He has significant mold exposure at home but no evidence of hypersensitivity pneumonitis on labs or CT scan  Continue Breztri As he has persistent symptoms we will repeat a high-res CT for evaluation  GERD Continue  Nexium  Severe Obstructive sleep apnea Compliant with CPAP  Plan/Recommendations: - Continue Breztri - High-res CT  Marshell Garfinkel MD [facility] 01/02/2023, 3:38 PM  CC: Cari Caraway, MD

## 2023-01-02 NOTE — Patient Instructions (Signed)
Will get a high-resolution CT as I cannot explain why he is so short of breath Continue the inhaler Return to clinic in 3 months.

## 2023-01-05 ENCOUNTER — Telehealth: Payer: Self-pay

## 2023-01-05 ENCOUNTER — Ambulatory Visit (INDEPENDENT_AMBULATORY_CARE_PROVIDER_SITE_OTHER): Payer: Medicare Other

## 2023-01-05 ENCOUNTER — Ambulatory Visit
Admission: EM | Admit: 2023-01-05 | Discharge: 2023-01-05 | Disposition: A | Discharge status: Home / Self Care | Payer: Medicare Other | Attending: Urgent Care | Admitting: Urgent Care

## 2023-01-05 DIAGNOSIS — S20229A Contusion of unspecified back wall of thorax, initial encounter: Secondary | ICD-10-CM

## 2023-01-05 DIAGNOSIS — R079 Chest pain, unspecified: Secondary | ICD-10-CM | POA: Diagnosis not present

## 2023-01-05 DIAGNOSIS — R0902 Hypoxemia: Secondary | ICD-10-CM | POA: Diagnosis not present

## 2023-01-05 DIAGNOSIS — R0602 Shortness of breath: Secondary | ICD-10-CM

## 2023-01-05 DIAGNOSIS — J4540 Moderate persistent asthma, uncomplicated: Secondary | ICD-10-CM | POA: Diagnosis not present

## 2023-01-05 DIAGNOSIS — J189 Pneumonia, unspecified organism: Secondary | ICD-10-CM

## 2023-01-05 DIAGNOSIS — R059 Cough, unspecified: Secondary | ICD-10-CM

## 2023-01-05 DIAGNOSIS — G4733 Obstructive sleep apnea (adult) (pediatric): Secondary | ICD-10-CM

## 2023-01-05 DIAGNOSIS — J9811 Atelectasis: Secondary | ICD-10-CM | POA: Diagnosis not present

## 2023-01-05 DIAGNOSIS — M546 Pain in thoracic spine: Secondary | ICD-10-CM | POA: Diagnosis not present

## 2023-01-05 MED ORDER — PROMETHAZINE-DM 6.25-15 MG/5ML PO SYRP: 2.5000 mL | ORAL_SOLUTION | Freq: Three times a day (TID) | ORAL | 0 refills | Status: DC | PRN

## 2023-01-05 MED ORDER — AMOXICILLIN-POT CLAVULANATE 875-125 MG PO TABS: 1.0000 | ORAL_TABLET | Freq: Two times a day (BID) | ORAL | 0 refills | Status: DC

## 2023-01-05 MED ORDER — AZITHROMYCIN 250 MG PO TABS: ORAL_TABLET | ORAL | 0 refills | Status: DC

## 2023-01-05 NOTE — ED Provider Notes (Signed)
[facility]  Note:  This document was prepared using Systems analyst and may include unintentional dictation errors.  MRN: [id number] DOB: 1940-12-12  Subjective:   [name] is a 82 y.o. male presenting for suffering a fall overnight.  Patient reports that this was accidental, tripped while he was outside either on some oil or from a rock.  Landed on hard pavement/driveway.  Has since had significant mid to upper back pain, difficulty with any kind of movements.  Also has a history of asthma and allergic rhinitis.  Reports that he feels short of breath but feels like it is more related to pain from his fall.  He just had a visit with his pulmonologist 01/02/2023.  Per chart review, no major changes were made.   Current Facility-Administered Medications:    methylPREDNISolone acetate (DEPO-MEDROL) injection 80 mg, 80 mg, Other, Once, Magnus Sinning, MD  Current Outpatient Medications:    albuterol (PROVENTIL) (2.5 MG/3ML) 0.083% nebulizer solution, Take 3 mLs (2.5 mg total) by nebulization every 4 (four) hours as needed for wheezing or shortness of breath., Disp: 75 mL, Rfl: 12   albuterol (VENTOLIN HFA) 108 (90 Base) MCG/ACT inhaler, Inhale 2 puffs into the lungs every 6 (six) hours as needed for wheezing or shortness of breath., Disp: 8 g, Rfl: 2   ASTEPRO 205.5 MCG/SPRAY SOLN, Place 2 sprays into both nostrils in the morning., Disp: , Rfl:    benzonatate (TESSALON) 100 MG capsule, Take 100 mg by mouth 3 (three) times daily as needed for cough., Disp: , Rfl:    Budeson-Glycopyrrol-Formoterol (BREZTRI AEROSPHERE) 160-9-4.8 MCG/ACT AERO, Inhale 2 puffs into the lungs in the morning and at bedtime., Disp: , Rfl:    buPROPion (WELLBUTRIN XL) 150 MG 24 hr tablet, Take 450 mg by mouth every morning., Disp: , Rfl:    CALCIUM PO, Take 1 tablet by mouth every morning., Disp: , Rfl:    docusate sodium (COLACE) 100 MG capsule, Take 1 capsule (100 mg  total) by mouth 2 (two) times daily. (Patient taking differently: Take 100 mg by mouth 2 (two) times daily as needed for mild constipation.), Disp: 10 capsule, Rfl: 0   doxazosin (CARDURA) 8 MG tablet, TAKE 1 TABLET BY MOUTH DAILY  . Take at night (Patient taking differently: Take 8 mg by mouth at bedtime.), Disp: 30 tablet, Rfl: 11   esomeprazole (NEXIUM 24HR) 20 MG capsule, Take 20 mg by mouth See admin instructions. Take 20 mg by mouth in the morning 30 minutes before breakfast and an additional 20 mg once a day as needed for continued heartburn, Disp: , Rfl:    fluticasone (FLONASE ALLERGY RELIEF) 50 MCG/ACT nasal spray, Place 2 sprays into both nostrils every evening., Disp: , Rfl:    furosemide (LASIX) 40 MG tablet, Take 1 tablet (40 mg total) by mouth daily. Take 40 mg by mouth in the morning and an additional 40 mg once a day as needed for unresolved swelling or fluid retention, Disp: 100 tablet, Rfl: 3   gabapentin (NEURONTIN) 300 MG capsule, TAKE 1 TO 2 CAPSULES BY MOUTH AT BEDTIME (Patient taking differently: Take 600 mg by mouth at bedtime.), Disp: 60 capsule, Rfl: 0   HYDROcodone-acetaminophen (NORCO) 5-325 MG tablet, Take 1-2 tablets by mouth every 4 (four) hours as needed for moderate pain (Use sparingly). (Patient taking differently: Take 1 tablet by mouth in the morning and at bedtime.), Disp: 40 tablet, Rfl: 0   methocarbamol (ROBAXIN) 500 MG  tablet, Take 1 tablet (500 mg total) by mouth every 8 (eight) hours as needed for muscle spasms. (Patient taking differently: Take 500 mg by mouth in the morning and at bedtime.), Disp: 20 tablet, Rfl: 0   mirtazapine (REMERON) 15 MG tablet, Take 15 mg by mouth at bedtime., Disp: , Rfl:    Misc Natural Products (GLUCOSAMINE CHONDROITIN TRIPLE) TABS, Take 1 tablet by mouth in the morning and at bedtime., Disp: , Rfl:    montelukast (SINGULAIR) 10 MG tablet, Take 1 tablet (10 mg total) by mouth at bedtime., Disp: 30 tablet, Rfl: 5   Multiple Vitamin  (MULTIVITAMIN WITH MINERALS) TABS tablet, Take 1 tablet by mouth every morning., Disp: , Rfl:    potassium chloride SA (K-DUR,KLOR-CON) 20 MEQ tablet, Take 1 tablet (20 mEq total) by mouth 2 (two) times daily., Disp: 18 tablet, Rfl: 3   predniSONE (DELTASONE) 10 MG tablet, Take prednisone 20 mg (two tabs) once daily for 2 days then take prednisone 10 mg (1 tab) once daily for 2 days then stop, Disp: 6 tablet, Rfl: 0   rosuvastatin (CRESTOR) 5 MG tablet, Take 5 mg by mouth at bedtime., Disp: , Rfl:    testosterone cypionate (DEPOTESTOSTERONE CYPIONATE) 200 MG/ML injection, Inject 150 mg into the muscle every Saturday., Disp: , Rfl:    UNKNOWN TO PATIENT, Take 1 tablet by mouth See admin instructions. 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   Hyperlipidemia    Joint pain    Neuromuscular disorder (HCC)    Pre-diabetes    Prediabetes    Sleep apnea    uses CPAP nightly   SOB (shortness of breath)    Swallowing difficulty    Swelling of lower extremity    right more than leg leg   Umbilical hernia      Past Surgical History:  Procedure Laterality Date   ABDOMINAL AORTOGRAM W/LOWER EXTREMITY N/A 03/14/2020   Procedure: ABDOMINAL AORTOGRAM W/LOWER EXTREMITY;  Surgeon: Serafina Mitchell, MD;  Location: [facility];  Service: Vascular;   Laterality: N/A;   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   AMPUTATION TOE Right 04/18/2022   Procedure: AMPUTATION RIGHT GREAT TOE;  Surgeon: [name], MD;  Location: [facility];  Service: Orthopedics;  Laterality: Right;   BACK SURGERY  2003   L4, L5   BACK SURGERY     CATARACT EXTRACTION, BILATERAL     ENDOVENOUS ABLATION SAPHENOUS VEIN W/ LASER Right 06-01-2014   EVLA right greater saphenous vein by Curt Jews MD    epidural steroid injections        [facility]  ortho dr Ernestina Patches   hernia repair  2003   HERNIA REPAIR     hernia inguinal x 2   TOTAL HIP ARTHROPLASTY Left 05/05/2022   Procedure: LEFT TOTAL HIP ARTHROPLASTY ANTERIOR APPROACH;  Surgeon: [name], MD;  Location: [facility];  Service: Orthopedics;  Laterality: Left;   TOTAL KNEE ARTHROPLASTY Left 03/22/2015   Procedure: TOTAL KNEE ARTHROPLASTY;  Surgeon: Meredith Pel, MD;  Location: [facility];  Service: Orthopedics;  Laterality: Left;   UMBILICAL HERNIA REPAIR N/A 06/27/2016   Procedure: LAPAROSCOPIC ASSISTED REPAIR OF UMBILICAL HERINA WITH MESH;  Surgeon: Johnathan Hausen, MD;  Location: [facility];  Service: General;  Laterality: N/A;   VASCULAR SURGERY  2015   right leg    Family History  Problem Relation Age of Onset   Thyroid disease Mother    Heart disease Father    Hypertension Father    Diabetes Maternal Grandfather    Colon cancer Neg Hx    Esophageal cancer Neg Hx     Social History   Tobacco Use   Smoking status: Never   Smokeless tobacco: Never  Vaping Use   Vaping Use: Never used  Substance Use Topics   Alcohol use: Yes    Comment: rare   Drug use: No    ROS   Objective:   Vitals: BP (!) 159/90 (BP Location: Right Arm)   Pulse 75   Temp 97.7 F (36.5 C) (Oral)   Resp 14   SpO2 92%   Physical Exam Constitutional:      General: He is not in acute distress.    Appearance: Normal appearance. He is well-developed and normal weight. He is not ill-appearing, toxic-appearing or  diaphoretic.  HENT:     Head: Normocephalic and atraumatic.     Right Ear: External ear normal.     Left Ear: External ear normal.     Nose: Nose normal.     Mouth/Throat:     Mouth: Mucous membranes are moist.     Pharynx: Oropharynx is clear.  Eyes:     General: No scleral icterus.       Right eye: No discharge.        Left eye: No discharge.     Extraocular Movements: Extraocular movements intact.  Cardiovascular:     Rate and Rhythm: Normal rate and regular rhythm.     Heart sounds: Normal heart sounds. No murmur heard.    No friction rub. No gallop.  Pulmonary:     Effort: Pulmonary effort is normal. No respiratory distress.     Breath sounds: Normal breath sounds. No stridor. No wheezing, rhonchi or rales.  Musculoskeletal:     Cervical back: Normal range of motion. Tenderness (lower paraspinal muscles) present. No swelling, edema, deformity, erythema, signs of trauma, lacerations, rigidity, spasms, torticollis, bony tenderness or crepitus. Pain with movement present. Normal range of motion.     Thoracic back: Spasms, tenderness and bony tenderness present. No swelling, edema, deformity, signs of trauma or lacerations. Decreased range of motion. No scoliosis.     Lumbar back: No swelling, edema, deformity, signs of trauma, lacerations, spasms, tenderness or bony tenderness. Decreased range of motion. Negative right straight leg raise test and negative left straight leg raise test. No scoliosis.  Neurological:     Mental Status: He is alert and oriented to person, place, and time.  Psychiatric:        Mood and Affect: Mood normal.        Behavior:  Behavior normal.        Thought Content: Thought content normal.        Judgment: Judgment normal.     DG Chest 2 View  Result Date: 01/05/2023 CLINICAL DATA:  Shortness of breath, hypoxia, back pain. Coughing. Shortness of breath. EXAM: CHEST - 2 VIEW COMPARISON:  Prior chest radiographs 11/15/2022 and earlier. FINDINGS: Shallow  inspiration radiograph. Ill-defined opacities within the bilateral lung bases. No evidence of pleural effusion or pneumothorax. No acute osseous abnormality identified. IMPRESSION: Shallow inspiration radiograph. Ill-defined opacities within the bilateral lung bases, at least partly reflecting atelectasis. Superimposed pneumonia cannot be excluded. Correlate clinically and consider short-interval radiographic follow-up. Electronically Signed   By: Kellie Simmering D.O.   On: 01/05/2023 17:15   DG Thoracic Spine 2 View  Result Date: 01/05/2023 CLINICAL DATA:  Short of breath, hypoxia EXAM: THORACIC SPINE 2 VIEWS COMPARISON:  None Available. FINDINGS: Dense osteophytosis of thoracic spine. No acute loss vertebral body height and disc height. There is bilateral central venous congestion of the lungs. IMPRESSION: 1. No acute findings in the thoracic spine. 2. Dense osteophytosis. 3. Central venous congestion in the  lungs. Electronically Signed   By: Suzy Bouchard M.D.   On: 01/05/2023 17:08    Assessment and Plan :   PDMP not reviewed this encounter.  1. Pneumonia of both lungs due to infectious organism, unspecified part of lung   2. Acute bilateral thoracic back pain   3. Moderate persistent asthma without complication   4. OSA on CPAP     Creatinine clearance calculated at 70m/min.

## 2023-01-05 NOTE — Procedures (Signed)
Lumbosacral Transforaminal Epidural Steroid Injection - Sub-Pedicular Approach with Fluoroscopic Guidance  Patient: [name]      Date of Birth: August 05, 1941 MRN: [id number] PCP: Cari Caraway, MD      Visit Date: 12/24/2022   Universal Protocol:    Date/Time: 12/24/2022  Consent Given By: the patient  Position: PRONE  Additional Comments: Vital signs were monitored before and after the procedure. Patient was prepped and draped in the usual sterile fashion. The correct patient, procedure, and site was verified.   Injection Procedure Details:   Procedure diagnoses: Lumbar radiculopathy [M54.16]    Meds Administered:  Meds ordered this encounter  Medications   methylPREDNISolone acetate (DEPO-MEDROL) injection 80 mg    Laterality: Bilateral  Location/Site: L4  Needle:5.0 in., 22 ga.  Short bevel or Quincke spinal needle  Needle Placement: Transforaminal  Findings:    -Comments: Excellent flow of contrast along the nerve, nerve root and into the epidural space.  Procedure Details: After squaring off the end-plates to get a true AP view, the C-arm was positioned so that an oblique view of the foramen as noted above was visualized. The target area is just inferior to the "nose of the scotty dog" or sub pedicular. The soft tissues overlying this structure were infiltrated with 2-3 ml. of 1% Lidocaine without Epinephrine.  The spinal needle was inserted toward the target using a "trajectory" view along the fluoroscope beam.  Under AP and lateral visualization, the needle was advanced so it did not puncture dura and was located close the 6 O'Clock position of the pedical in AP tracterory. Biplanar projections were used to confirm position. Aspiration was confirmed to be negative for CSF and/or blood. A 1-2 ml. volume of Isovue-250 was injected and flow of contrast was noted at each level. Radiographs were obtained for documentation purposes.   After attaining the desired  flow of contrast documented above, a 0.5 to 1.0 ml test dose of 0.25% Marcaine was injected into each respective transforaminal space.  The patient was observed for 90 seconds post injection.  After no sensory deficits were reported, and normal lower extremity motor function was noted,   the above injectate was administered so that equal amounts of the injectate were placed at each foramen (level) into the transforaminal epidural space.   Additional Comments:  No complications occurred Dressing: 2 x 2 sterile gauze and Band-Aid    Post-procedure details: Patient was observed during the procedure. Post-procedure instructions were reviewed.  Patient left the clinic in stable condition.

## 2023-01-05 NOTE — Telephone Encounter (Signed)
Patient called triage. He fell this morning in his driveway and landed on his back. He is having a great amount of pain and unable to catch his breath. I have recommended that he be evaluated at an ED or UC. He refuses both. Barnet Pall has been made aware and is contacting patient to discuss.

## 2023-01-05 NOTE — Progress Notes (Signed)
[name] - 82 y.o. male MRN [id number]  Date of birth: 02/08/1941  Office Visit Note: Visit Date: 12/24/2022 PCP: Cari Caraway, MD Referred by: Cari Caraway, MD  Subjective: Chief Complaint  Patient presents with   Lower Back - Pain   HPI:  [name] is a 82 y.o. male who comes in today for planned repeat Bilateral L4-5  Lumbar Transforaminal epidural steroid injection with fluoroscopic guidance.  The patient has failed conservative care including home exercise, medications, time and activity modification.  This injection will be diagnostic and hopefully therapeutic.  Please see requesting physician notes for further details and justification. Patient received more than 50% pain relief from prior injection.   Referring: Barnet Pall, FNP   ROS Otherwise per HPI.  Assessment & Plan: Visit Diagnoses:    ICD-10-CM   1. Lumbar radiculopathy  M54.16 XR C-ARM NO REPORT    Epidural Steroid injection    methylPREDNISolone acetate (DEPO-MEDROL) injection 80 mg    2. 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   Meds & Orders:  Meds ordered this encounter  Medications   methylPREDNISolone acetate (DEPO-MEDROL) injection 80 mg    Orders Placed This Encounter  Procedures   XR C-ARM NO REPORT   Epidural Steroid injection    Follow-up: Return for visit to requesting provider as needed.   Procedures: No procedures performed  Lumbosacral Transforaminal Epidural Steroid Injection - Sub-Pedicular Approach with Fluoroscopic Guidance  Patient: [name]      Date of Birth: 05-Nov-1941 MRN: [id number] PCP: Cari Caraway, MD      Visit Date: 12/24/2022   Universal Protocol:    Date/Time: 12/24/2022  Consent Given By: the patient  Position: PRONE  Additional Comments: Vital signs were monitored before and after the procedure. Patient was prepped and draped  in the usual sterile fashion. The correct patient, procedure, and site was verified.   Injection Procedure Details:   Procedure diagnoses: Lumbar radiculopathy [M54.16]    Meds Administered:  Meds ordered this encounter  Medications   methylPREDNISolone acetate (DEPO-MEDROL) injection 80 mg    Laterality: Bilateral  Location/Site: L4  Needle:5.0 in., 22 ga.  Short bevel or Quincke spinal needle  Needle Placement: Transforaminal  Findings:    -Comments: Excellent flow of contrast along the nerve, nerve root and into the epidural space.  Procedure Details: After squaring off the end-plates to get a true AP view, the C-arm was positioned so that an oblique view of the foramen as noted above was visualized. The target area is just inferior to the "nose of the scotty dog" or sub pedicular. The soft tissues overlying this structure were infiltrated with 2-3 ml. of 1% Lidocaine without Epinephrine.  The spinal needle was inserted toward the target using a "trajectory" view along the fluoroscope beam.  Under AP and lateral visualization, the needle was advanced so it did not puncture dura and was located close the 6 O'Clock position of the pedical in AP tracterory. Biplanar projections were used to confirm position. Aspiration was confirmed to be negative for CSF and/or blood. A 1-2 ml. volume of Isovue-250 was injected and flow of contrast was noted at each level. Radiographs were obtained for documentation purposes.   After attaining the desired flow of contrast documented above, a 0.5 to 1.0 ml test dose of 0.25% Marcaine was injected into each  respective transforaminal space.  The patient was observed for 90 seconds post injection.  After no sensory deficits were reported, and normal lower extremity motor function was noted,   the above injectate was administered so that equal amounts of the injectate were placed at each foramen (level) into the transforaminal epidural  space.   Additional Comments:  No complications occurred Dressing: 2 x 2 sterile gauze and Band-Aid    Post-procedure details: Patient was observed during the procedure. Post-procedure instructions were reviewed.  Patient left the clinic in stable condition.    Clinical History: CT LUMBAR MYELOGRAM FINDINGS:     T10-11: No stenosis. Facet degeneration and ligamentous  calcification. Solid bridging anterior osteophytes.     T11-12: No stenosis.     T12-L1: Mild bulging of the disc. No compressive stenosis. Conus tip  at this level.     L1-2: 2 mm retrolisthesis. Moderate multifactorial spinal stenosis.  Disc space narrowing. Endplate osteophytes. Facet and ligamentous  hypertrophy. Foraminal stenosis on both sides that could compress  the L1 nerves.     L2-3: 2 mm retrolisthesis. Chronic disc degeneration with loss of  disc height and vacuum phenomenon. Endplate osteophytes. Facet and  ligamentous hypertrophy. Severe multifactorial spinal stenosis  likely to cause neural compression. Bilateral foraminal stenosis  that could compress the L2 nerves.     L3-4: 2 mm retrolisthesis. Endplate osteophytes and bulging of the  disc. Facet and ligamentous hypertrophy. Severe multifactorial  spinal stenosis likely to cause neural compression. Bilateral  foraminal stenosis that could affect the exiting L3 nerves.     L4-5: Chronic facet arthropathy with anterolisthesis of 1 cm. Disc  degeneration with disc space narrowing and vacuum phenomenon.  Endplate osteophytes and bulging of the disc. Severe stenosis of the  central canal and both neural foramina likely to cause neural  compression.     L5-S1: Chronic disc degeneration with loss of disc height and vacuum  phenomenon. Endplate osteophytes. Facet hypertrophy and degeneration  with some ligamentous calcification. Severe stenosis of the  subarticular lateral recesses and neural foramina.     IMPRESSION:  Advanced chronic  multilevel degenerative disease throughout the  lumbar region. Curvature convex to the left in the thoracolumbar  junction region and towards the right in the lower lumbar region. 2  mm retrolisthesis L1-2, L2-3 and L3-4. 1 cm anterolisthesis L4-5.     Severe multifactorial spinal stenosis and neural foraminal stenosis  likely to cause neural compression at L2-3, L3-4, L4-5 and L5-S1.  Moderate multifactorial canal stenosis at L1-2. Bilateral foraminal  stenosis at that level.        Electronically Signed    By: Nelson Chimes M.D.    On: 12/06/2019 10:12     Objective:  VS:  HT:    WT:   BMI:     BP:(!) 147/91  HR:93bpm  TEMP: ( )  RESP:  Physical Exam Vitals and nursing note reviewed.  Constitutional:      General: He is not in acute distress.    Appearance: Normal appearance. He is obese. He is not ill-appearing.  HENT:     Head: Normocephalic and atraumatic.     Right Ear: External ear normal.     Left Ear: External ear normal.     Nose: No congestion.  Eyes:     Extraocular Movements: Extraocular movements intact.  Cardiovascular:     Rate and Rhythm: Normal rate.     Pulses: Normal pulses.  Pulmonary:  Effort: Pulmonary effort is normal. No respiratory distress.  Abdominal:     General: There is no distension.     Palpations: Abdomen is soft.  Musculoskeletal:        General: No tenderness or signs of injury.     Cervical back: Neck supple.     Right lower leg: Edema present.     Left lower leg: Edema present.     Comments: Right foot drop. Good distal strength on the left. Forward flexed spine.  Skin:    Findings: No erythema or rash.  Neurological:     Mental Status: He is alert and oriented to person, place, and time.     Sensory: No sensory deficit.     Motor: Weakness present. No abnormal muscle tone.     Coordination: Coordination normal.     Gait: Gait abnormal.  Psychiatric:        Mood and Affect: Mood normal.        Behavior: Behavior  normal.      Imaging: DG Chest 2 View  Result Date: 01/05/2023 CLINICAL DATA:  Shortness of breath, hypoxia, back pain. Coughing. Shortness of breath. EXAM: CHEST - 2 VIEW COMPARISON:  Prior chest radiographs 11/15/2022 and earlier. FINDINGS: Shallow inspiration radiograph. Ill-defined opacities within the bilateral lung bases. No evidence of pleural effusion or pneumothorax. No acute osseous abnormality identified. IMPRESSION: Shallow inspiration radiograph. Ill-defined opacities within the bilateral lung bases, at least partly reflecting atelectasis. Superimposed pneumonia cannot be excluded. Correlate clinically and consider short-interval radiographic follow-up. Electronically Signed   By: Kellie Simmering D.O.   On: 01/05/2023 17:15   DG Thoracic Spine 2 View  Result Date: 01/05/2023 CLINICAL DATA:  Short of breath, hypoxia EXAM: THORACIC SPINE 2 VIEWS COMPARISON:  None Available. FINDINGS: Dense osteophytosis of thoracic spine. No acute loss vertebral body height and disc height. There is bilateral central venous congestion of the lungs. IMPRESSION: 1. No acute findings in the thoracic spine. 2. Dense osteophytosis. 3. Central venous congestion in the  lungs. Electronically Signed   By: Suzy Bouchard M.D.   On: 01/05/2023 17:08

## 2023-01-05 NOTE — ED Triage Notes (Signed)
Pt reports he slipped on oil and fell on his back on the concrete at 12 am today. Painful when breathing, coughing and moving. Havd trouble breathing. Took hydrocodone about 4:40 am.   Pt states his back hurts between his shoulders

## 2023-01-05 NOTE — ED Notes (Signed)
Resting in chair after xrays-NAD-O2 sat 94% RA

## 2023-01-12 ENCOUNTER — Ambulatory Visit (HOSPITAL_COMMUNITY): Payer: Medicare Other | Attending: Cardiovascular Disease

## 2023-01-12 ENCOUNTER — Other Ambulatory Visit (HOSPITAL_COMMUNITY): Payer: Medicare Other

## 2023-01-12 DIAGNOSIS — R0609 Other forms of dyspnea: Secondary | ICD-10-CM | POA: Diagnosis not present

## 2023-01-12 DIAGNOSIS — E782 Mixed hyperlipidemia: Secondary | ICD-10-CM | POA: Insufficient documentation

## 2023-01-12 DIAGNOSIS — G4730 Sleep apnea, unspecified: Secondary | ICD-10-CM | POA: Diagnosis not present

## 2023-01-12 DIAGNOSIS — R609 Edema, unspecified: Secondary | ICD-10-CM | POA: Diagnosis not present

## 2023-01-12 LAB — ECHOCARDIOGRAM COMPLETE
Area-P 1/2: 3.91 cm2
P 1/2 time: 591 msec
S' Lateral: 3.1 cm

## 2023-01-14 ENCOUNTER — Ambulatory Visit
Admission: RE | Admit: 2023-01-14 | Discharge: 2023-01-14 | Disposition: A | Discharge status: Home / Self Care | Payer: Medicare Other | Source: Ambulatory Visit | Attending: Pulmonary Disease | Admitting: Pulmonary Disease

## 2023-01-14 DIAGNOSIS — M19012 Primary osteoarthritis, left shoulder: Secondary | ICD-10-CM | POA: Diagnosis not present

## 2023-01-14 DIAGNOSIS — I2510 Atherosclerotic heart disease of native coronary artery without angina pectoris: Secondary | ICD-10-CM | POA: Diagnosis not present

## 2023-01-14 DIAGNOSIS — Z6841 Body Mass Index (BMI) 40.0 and over, adult: Secondary | ICD-10-CM | POA: Diagnosis not present

## 2023-01-14 DIAGNOSIS — R0602 Shortness of breath: Secondary | ICD-10-CM

## 2023-01-14 DIAGNOSIS — S20229D Contusion of unspecified back wall of thorax, subsequent encounter: Secondary | ICD-10-CM | POA: Diagnosis not present

## 2023-01-14 DIAGNOSIS — I700 Atherosclerosis of aorta: Secondary | ICD-10-CM | POA: Diagnosis not present

## 2023-01-15 DIAGNOSIS — M1611 Unilateral primary osteoarthritis, right hip: Secondary | ICD-10-CM | POA: Diagnosis not present

## 2023-01-15 DIAGNOSIS — G5602 Carpal tunnel syndrome, left upper limb: Secondary | ICD-10-CM | POA: Diagnosis not present

## 2023-01-15 DIAGNOSIS — M1711 Unilateral primary osteoarthritis, right knee: Secondary | ICD-10-CM | POA: Diagnosis not present

## 2023-01-22 DIAGNOSIS — M7061 Trochanteric bursitis, right hip: Secondary | ICD-10-CM | POA: Diagnosis not present

## 2023-01-22 DIAGNOSIS — M25512 Pain in left shoulder: Secondary | ICD-10-CM | POA: Diagnosis not present

## 2023-01-27 ENCOUNTER — Other Ambulatory Visit: Payer: Self-pay | Admitting: Urology

## 2023-01-29 ENCOUNTER — Encounter: Payer: Self-pay | Admitting: *Deleted

## 2023-01-30 ENCOUNTER — Telehealth: Payer: Self-pay | Admitting: Pulmonary Disease

## 2023-01-30 NOTE — Telephone Encounter (Signed)
[employer] patient assistance provider form for [facility] received.  Completed provider form signed by Dr. Vaughan Browner faxed to [employer] [phone]. Confirmation received.  Nothing further at this time.

## 2023-02-23 ENCOUNTER — Ambulatory Visit
Admission: EM | Admit: 2023-02-23 | Discharge: 2023-02-23 | Discharge status: Absent without leave | Payer: Medicare Other

## 2023-02-23 ENCOUNTER — Emergency Department (HOSPITAL_COMMUNITY)
Admission: EM | Admit: 2023-02-23 | Discharge: 2023-02-23 | Discharge status: Against Medical Advice | Payer: Medicare Other | Attending: Emergency Medicine | Admitting: Emergency Medicine

## 2023-02-23 ENCOUNTER — Encounter (HOSPITAL_COMMUNITY): Payer: Self-pay

## 2023-02-23 DIAGNOSIS — Z5321 Procedure and treatment not carried out due to patient leaving prior to being seen by health care provider: Secondary | ICD-10-CM | POA: Insufficient documentation

## 2023-02-23 DIAGNOSIS — M7989 Other specified soft tissue disorders: Secondary | ICD-10-CM | POA: Diagnosis not present

## 2023-02-23 DIAGNOSIS — R2243 Localized swelling, mass and lump, lower limb, bilateral: Secondary | ICD-10-CM | POA: Diagnosis not present

## 2023-02-23 LAB — CBC WITH DIFFERENTIAL/PLATELET
Abs Immature Granulocytes: 0.02 10*3/uL (ref 0.00–0.07)
Basophils Absolute: 0 10*3/uL (ref 0.0–0.1)
Basophils Relative: 0 %
Eosinophils Absolute: 0.1 10*3/uL (ref 0.0–0.5)
Eosinophils Relative: 2 %
HCT: 41.6 % (ref 39.0–52.0)
Hemoglobin: 13.4 g/dL (ref 13.0–17.0)
Immature Granulocytes: 0 %
Lymphocytes Relative: 13 %
Lymphs Abs: 1 10*3/uL (ref 0.7–4.0)
MCH: 30.6 pg (ref 26.0–34.0)
MCHC: 32.2 g/dL (ref 30.0–36.0)
MCV: 95 fL (ref 80.0–100.0)
Monocytes Absolute: 0.9 10*3/uL (ref 0.1–1.0)
Monocytes Relative: 11 %
Neutro Abs: 5.5 10*3/uL (ref 1.7–7.7)
Neutrophils Relative %: 74 %
Platelets: 221 10*3/uL (ref 150–400)
RBC: 4.38 MIL/uL (ref 4.22–5.81)
RDW: 13.3 % (ref 11.5–15.5)
WBC: 7.5 10*3/uL (ref 4.0–10.5)
nRBC: 0 % (ref 0.0–0.2)

## 2023-02-23 LAB — COMPREHENSIVE METABOLIC PANEL
ALT: 31 U/L (ref 0–44)
AST: 33 U/L (ref 15–41)
Albumin: 3.8 g/dL (ref 3.5–5.0)
Alkaline Phosphatase: 87 U/L (ref 38–126)
Anion gap: 12 (ref 5–15)
BUN: 12 mg/dL (ref 8–23)
CO2: 26 mmol/L (ref 22–32)
Calcium: 9 mg/dL (ref 8.9–10.3)
Chloride: 100 mmol/L (ref 98–111)
Creatinine, Ser: 1.88 mg/dL — ABNORMAL HIGH (ref 0.61–1.24)
GFR, Estimated: 35 mL/min — ABNORMAL LOW (ref >60)
Glucose, Bld: 119 mg/dL — ABNORMAL HIGH (ref 70–99)
Potassium: 3.2 mmol/L — ABNORMAL LOW (ref 3.5–5.1)
Sodium: 138 mmol/L (ref 135–145)
Total Bilirubin: 0.8 mg/dL (ref 0.3–1.2)
Total Protein: 7.1 g/dL (ref 6.5–8.1)

## 2023-02-23 NOTE — ED Triage Notes (Signed)
Pt reports that he has had swelling to his R leg for a while, sees wound clinic and wears compression stockings, leg is red and swollen.

## 2023-02-23 NOTE — ED Notes (Signed)
Pt told registration that he was leaving.  ?

## 2023-02-23 NOTE — ED Provider Triage Note (Signed)
Emergency Medicine Provider Triage Evaluation Note  [name] , a 82 y.o. male  was evaluated in triage.  Pt complains of right leg swelling.  Reports that he has noticed some right leg swelling for about a week or so now.  Has previously been treated for cellulitis on bilateral lower extremities.  Currently on furosemide.  Reports that he is felt that the right leg has become more red and slightly warm to the touch.  Denies any significant or worsening pain in the lower extremities.  Denies any shortness of breath or chest pain..  Review of Systems  Positive: As above Negative: As above  Physical Exam  BP (!) 175/85   Pulse (!) 105   Temp 98.6 F (37 C) (Oral)   Resp 18   SpO2 96%  Gen:   Awake, no distress   Resp:  Normal effort  MSK:   Moves extremities without difficulty  Other:  +2 pitting edema noted to the right lower extremity.  There is notable erythematous skin overlying the majority of the left lower extremity setting into the left foot with multiple superficial lesions on the right lower extremity with poor wound healing  Medical Decision Making  Medically screening exam initiated at 8:30 PM.  Appropriate orders placed.  [name] was informed that the remainder of the evaluation will be completed by another provider, this initial triage assessment does not replace that evaluation, and the importance of remaining in the ED until their evaluation is complete.    Smitty Knudsen, PA-C 02/23/23 2031

## 2023-02-24 DIAGNOSIS — L03115 Cellulitis of right lower limb: Secondary | ICD-10-CM | POA: Diagnosis not present

## 2023-02-24 DIAGNOSIS — I890 Lymphedema, not elsewhere classified: Secondary | ICD-10-CM | POA: Diagnosis not present

## 2023-02-24 DIAGNOSIS — L97219 Non-pressure chronic ulcer of right calf with unspecified severity: Secondary | ICD-10-CM | POA: Diagnosis not present

## 2023-03-02 ENCOUNTER — Encounter (HOSPITAL_BASED_OUTPATIENT_CLINIC_OR_DEPARTMENT_OTHER): Payer: Medicare Other | Attending: Internal Medicine | Admitting: Internal Medicine

## 2023-03-02 DIAGNOSIS — G629 Polyneuropathy, unspecified: Secondary | ICD-10-CM | POA: Diagnosis not present

## 2023-03-02 DIAGNOSIS — Z79899 Other long term (current) drug therapy: Secondary | ICD-10-CM | POA: Diagnosis not present

## 2023-03-02 DIAGNOSIS — Z89411 Acquired absence of right great toe: Secondary | ICD-10-CM | POA: Insufficient documentation

## 2023-03-02 DIAGNOSIS — N183 Chronic kidney disease, stage 3 unspecified: Secondary | ICD-10-CM | POA: Insufficient documentation

## 2023-03-02 DIAGNOSIS — M1990 Unspecified osteoarthritis, unspecified site: Secondary | ICD-10-CM | POA: Diagnosis not present

## 2023-03-02 DIAGNOSIS — I129 Hypertensive chronic kidney disease with stage 1 through stage 4 chronic kidney disease, or unspecified chronic kidney disease: Secondary | ICD-10-CM | POA: Insufficient documentation

## 2023-03-02 DIAGNOSIS — Z791 Long term (current) use of non-steroidal anti-inflammatories (NSAID): Secondary | ICD-10-CM | POA: Insufficient documentation

## 2023-03-02 DIAGNOSIS — I87313 Chronic venous hypertension (idiopathic) with ulcer of bilateral lower extremity: Secondary | ICD-10-CM | POA: Insufficient documentation

## 2023-03-02 DIAGNOSIS — Z96642 Presence of left artificial hip joint: Secondary | ICD-10-CM | POA: Diagnosis not present

## 2023-03-02 DIAGNOSIS — Z96652 Presence of left artificial knee joint: Secondary | ICD-10-CM | POA: Diagnosis not present

## 2023-03-02 DIAGNOSIS — J45909 Unspecified asthma, uncomplicated: Secondary | ICD-10-CM | POA: Insufficient documentation

## 2023-03-02 DIAGNOSIS — Z09 Encounter for follow-up examination after completed treatment for conditions other than malignant neoplasm: Secondary | ICD-10-CM | POA: Insufficient documentation

## 2023-03-02 DIAGNOSIS — G4730 Sleep apnea, unspecified: Secondary | ICD-10-CM | POA: Diagnosis not present

## 2023-03-02 DIAGNOSIS — I872 Venous insufficiency (chronic) (peripheral): Secondary | ICD-10-CM | POA: Insufficient documentation

## 2023-03-02 DIAGNOSIS — L97812 Non-pressure chronic ulcer of other part of right lower leg with fat layer exposed: Secondary | ICD-10-CM | POA: Insufficient documentation

## 2023-03-02 DIAGNOSIS — L97822 Non-pressure chronic ulcer of other part of left lower leg with fat layer exposed: Secondary | ICD-10-CM | POA: Diagnosis not present

## 2023-03-02 NOTE — Progress Notes (Signed)
[name], [name] ([id number]) [id number] Page 1 of 11 Visit Report for 03/02/2023 Allergy List Details Patient Name: [name]: [name] [name] 03/02/2023 12:30 PM Medical Record Number: [id number] Patient Account Number: [id number] Date of Birth/Sex: Treating RN: 12/19/1940 (82 y.o. Tammy Sours Primary Care Madyson Lukach: [name] Other Clinician: Referring Nitya Cauthon: Treating Luv Mish/Extender: [name] Weeks in Treatment: 0 Allergies Active Allergies No Known Allergies Allergy Notes Electronic Signature(s) Signed: 03/02/2023 4:08:22 PM By: Thayer Dallas Entered By: Thayer Dallas on 03/02/2023 12:58:51 -------------------------------------------------------------------------------- Arrival Information Details Patient Name: [name]: [name] [name] 03/02/2023 12:30 PM Medical Record Number: [id number] Patient Account Number: [id number] Date of Birth/Sex: Treating RN: 03-Sep-1941 (82 y.o. M) Primary Care Lew Prout: [name] Other Clinician: Referring Lyndsi Altic: Treating Aarthi Uyeno/Extender: [name] Weeks in Treatment: 0 Visit Information Patient Arrived: Danella Maiers Time: 12:49 Accompanied By: self Transfer Assistance: None Patient Identification Verified: Yes Secondary Verification Process Completed: Yes Patient Requires Transmission-Based Precautions: No Patient Has Alerts: No History Since Last Visit Electronic Signature(s) Signed: 03/02/2023 4:08:22 PM By: Thayer Dallas Entered By: Thayer Dallas on 03/02/2023 12:56:09 -------------------------------------------------------------------------------- Compression Therapy Details Patient Name: [name]: [name] [name] 03/02/2023 12:30 PM Medical Record Number: [id number] Patient Account Number: [id number] Date of Birth/Sex: Treating RN: [birth year] (82 y.o. [name] Primary Care Gaylene Moylan: [name] Other Clinician: Referring Loney Domingo: Treating Kathie Posa/Extender: [name] Weeks in Treatment: 0 Compression Therapy Performed for Wound Assessment: Wound #11 Left,Anterior Lower Leg Performed By: Clinician Redmond Pulling, RN Compression Type: Three Layer Post Procedure Diagnosis Same as Pre-procedure Electronic Signature(s) LABRANDON, KNOCH ([id number]) [id number] Page 2 of 11 Signed: 03/02/2023 4:20:37 PM By: Redmond Pulling RN, BSN Entered By: Redmond Pulling on 03/02/2023 14:02:21 -------------------------------------------------------------------------------- Compression Therapy Details Patient Name: [name]: [name] [name] 03/02/2023 12:30 PM Medical Record Number: [id number] Patient Account Number: [id number] Date of Birth/Sex: Treating RN: Mar 22, 1941 (82 y.o. [name] Primary Care Naima Veldhuizen: [name] Other Clinician: Referring Elycia Woodside: Treating Yannis Broce/Extender: [name] Weeks in Treatment: 0 Compression Therapy Performed for Wound Assessment: Wound #12 Right,Proximal,Anterior Lower Leg Performed By: Clinician Redmond Pulling, RN Compression Type: Three Layer Post Procedure Diagnosis Same as Pre-procedure Electronic Signature(s) Signed: 03/02/2023 4:20:37 PM By: Redmond Pulling RN, BSN Entered By: Redmond Pulling on 03/02/2023 14:02:21 -------------------------------------------------------------------------------- Compression Therapy Details Patient Name: [name]: [name] [name] 03/02/2023 12:30 PM Medical Record Number: [id number] Patient Account Number: [id number] Date of Birth/Sex: Treating RN: [birth year] (82 y.o. [name] Primary Care Elton Catalano: [name] Other Clinician: Referring Maryn Freelove: Treating Kiira Brach/Extender: [name] Weeks in Treatment: 0 Compression Therapy Performed for Wound Assessment: Wound  #13 Right,Distal,Anterior Lower Leg Performed By: Clinician Redmond Pulling, RN Compression Type: Three Layer Post Procedure Diagnosis Same as Pre-procedure Electronic Signature(s) Signed: 03/02/2023 4:20:37 PM By: Redmond Pulling RN, BSN Entered By: Redmond Pulling on 03/02/2023 14:02:21 -------------------------------------------------------------------------------- Encounter Discharge Information Details Patient Name: [name]: [name] [name] 03/02/2023 12:30 PM Medical Record Number: [id number] Patient Account Number: [id number] Date of Birth/Sex: Treating RN: 1941-08-03 (82 y.o. [name] Primary Care Levada Bowersox: [name] Other Clinician: Referring Kaylean Tupou: Treating Lillyen Schow/Extender: [name] Weeks in Treatment: 0 Encounter Discharge Information Items Discharge Condition: Stable Ambulatory Status: Ambulatory Discharge Destination: Home Transportation: Private Auto Accompanied By: self Schedule Follow-up Appointment: Yes Clinical Summary of Care: Patient Declined Electronic Signature(s)  Signed: 03/02/2023 4:20:37 PM By: Redmond Pulling RN, BSN [name] ([id number]) [id number] Page 3 of 11 Entered By: Redmond Pulling on 03/02/2023 15:46:10 -------------------------------------------------------------------------------- Lower Extremity Assessment Details Patient Name: [name]: [name] [name] 03/02/2023 12:30 PM Medical Record Number: [id number] Patient Account Number: [id number] Date of Birth/Sex: Treating RN: 1941-04-19 (82 y.o. M) Primary Care Roiza Wiedel: [name] Other Clinician: Referring Alhassan Everingham: Treating Marjoria Mancillas/Extender: [name] Weeks in Treatment: 0 Edema Assessment Assessed: Kyra Searles: Yes] Franne Forts: Yes] Edema: [Left: Yes] [Right: Yes] Calf Left: Right: Point of Measurement: 37 cm From Medial Instep 38.2 cm 39.5 cm Ankle Left: Right: Point of  Measurement: 11.5 cm From Medial Instep 24 cm 23.5 cm Knee To Floor Left: Right: From Medial Instep 45 cm Vascular Assessment Pulses: Dorsalis Pedis Palpable: [Left:Yes] [Right:Yes] Electronic Signature(s) Signed: 03/02/2023 4:08:22 PM By: Thayer Dallas Entered By: Thayer Dallas on 03/02/2023 13:36:54 -------------------------------------------------------------------------------- Multi Wound Chart Details Patient Name: [name]: [name] [name] 03/02/2023 12:30 PM Medical Record Number: [id number] Patient Account Number: [id number] Date of Birth/Sex: Treating RN: 21-Jan-1941 (82 y.o. M) Primary Care Mykeal Carrick: [name] Other Clinician: Referring Xzavier Swinger: Treating Caroleen Stoermer/Extender: [name] Weeks in Treatment: 0 Vital Signs Height(in): 70 Pulse(bpm): 82 Weight(lbs): 250 Blood Pressure(mmHg): 165/82 Body Mass Index(BMI): 35.9 Temperature(F): 97.7 Respiratory Rate(breaths/min): 20 [11:Photos:] Left, Anterior Lower Leg Right, Proximal, Anterior Lower Leg Right, Distal, Anterior Lower Leg Wound Location: NEHEMIAS, SAUCEDA ([id number]) [id number] Page 4 of 11 Blister Blister Blister 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 Venous Leg Ulcer Primary Etiology: Cataracts, Chronic sinus Cataracts, Chronic sinus Cataracts, Chronic sinus Comorbid History: problems/congestion, Asthma, Sleep problems/congestion, Asthma, Sleep problems/congestion, Asthma, Sleep Apnea, Hypertension, Peripheral Apnea, Hypertension, Peripheral Apnea, Hypertension, Peripheral Venous Disease, Osteoarthritis, Venous Disease, Osteoarthritis, Venous Disease, Osteoarthritis, Neuropathy Neuropathy Neuropathy 02/16/2023 02/16/2023 02/16/2023 Date Acquired: 0 0 0 Weeks of Treatment: Open Open Open Wound Status: No No No Wound Recurrence: Yes No No Clustered Wound: 2 N/A N/A Clustered Quantity: 3.5x1x0.1 1.8x1x0.1  1x0.8x0.1 Measurements L x W x D (cm) 2.749 1.414 0.628 A (cm) : rea 0.275 0.141 0.063 Volume (cm) : Full Thickness Without Exposed Full Thickness Without Exposed Full Thickness Without Exposed Classification: Support Structures Support Structures Support Structures Medium Medium Medium Exudate Amount: Serosanguineous Serosanguineous Serosanguineous Exudate Type: red, brown red, brown red, brown Exudate Color: Large (67-100%) Medium (34-66%) Small (1-33%) Granulation Amount: Red, Pink Red, Pink Red, Pink Granulation Quality: Small (1-33%) Medium (34-66%) Large (67-100%) Necrotic Amount: Fat Layer (Subcutaneous Tissue): Yes Fat Layer (Subcutaneous Tissue): Yes Fat Layer (Subcutaneous Tissue): Yes Exposed Structures: Fascia: No Fascia: No Fascia: No Tendon: No Tendon: No Tendon: No Muscle: No Muscle: No Muscle: No Joint: No Joint: No Joint: No Bone: No Bone: No Bone: No None None None Epithelialization: Compression Therapy Compression Therapy Compression Therapy Procedures Performed: Treatment Notes Electronic Signature(s) Signed: 03/02/2023 3:35:10 PM By: Geralyn Corwin DO Entered By: Geralyn Corwin on 03/02/2023 14:03:36 -------------------------------------------------------------------------------- Multi-Disciplinary Care Plan Details Patient Name: [name]: [name] [name] 03/02/2023 12:30 PM Medical Record Number: [id number] Patient Account Number: [id number] Date of Birth/Sex: Treating RN: 1941/03/30 (82 y.o. [name] Primary Care Makinsley Schiavi: [name] Other Clinician: Referring Margaretann Abate: Treating Viveka Wilmeth/Extender: [name] Weeks in Treatment: 0 Active Inactive Venous Leg Ulcer Nursing Diagnoses: Actual venous Insuffiency (use after diagnosis is confirmed) Knowledge deficit related to disease process and management Goals: Patient will maintain optimal edema control Date Initiated:  03/02/2023  Target Resolution Date: 04/08/2023 Goal Status: Active Patient/caregiver will verbalize understanding of disease process and disease management Date Initiated: 03/02/2023 Target Resolution Date: 03/31/2023 Goal Status: Active Interventions: Assess peripheral edema status every visit. Compression as ordered Provide education on venous insufficiency Notes: [address] JUSHUA, WALTMAN ([id number]) [id number] Page 5 of 11 Nursing Diagnoses: Impaired tissue integrity Knowledge deficit related to ulceration/compromised skin integrity Goals: Patient/caregiver will verbalize understanding of skin care regimen Date Initiated: 03/02/2023 Target Resolution Date: 04/08/2023 Goal Status: Active Ulcer/skin breakdown will have a volume reduction of 30% by week 4 Date Initiated: 03/02/2023 Target Resolution Date: 03/31/2023 Goal Status: Active Interventions: Assess patient/caregiver ability to obtain necessary supplies Assess patient/caregiver ability to perform ulcer/skin care regimen upon admission and as needed Assess ulceration(s) every visit Provide education on ulcer and skin care Notes: Electronic Signature(s) Signed: 03/02/2023 4:20:37 PM By: Redmond Pulling RN, BSN Entered By: Redmond Pulling on 03/02/2023 14:01:35 -------------------------------------------------------------------------------- Pain Assessment Details Patient Name: [name]: [name] [name] 03/02/2023 12:30 PM Medical Record Number: [id number] Patient Account Number: [id number] Date of Birth/Sex: Treating RN: 08-28-1941 (82 y.o. [name] Primary Care Azalya Galyon: [name] Other Clinician: Referring Raef Sprigg: Treating Kasey Hansell/Extender: [name] Weeks in Treatment: 0 Active Problems Location of Pain Severity and Description of Pain Patient Has Paino No Site Locations Pain Management and Medication Current Pain  Management: Electronic Signature(s) Signed: 03/02/2023 4:20:37 PM By: Redmond Pulling RN, BSN Entered By: Redmond Pulling on 03/02/2023 13:39:19 -------------------------------------------------------------------------------- Patient/Caregiver Education Details Patient Name: [name]: [name] [name] 4/22/2024andnbsp12:30 PM Medical Record Number: [id number] Patient Account Number: [id number] SHAHEIM, MAHAR ([id number]) [id number] Page 6 of 11 Date of Birth/Gender: Treating RN: [birth year] (82 y.o. [name] Primary Care Physician: [name] Other Clinician: Referring Physician: Treating Physician/Extender: [name] Weeks in Treatment: 0 Education Assessment Education Provided To: Patient Education Topics Provided Venous: Methods: Explain/Verbal Responses: State content correctly Wound/Skin Impairment: Methods: Explain/Verbal Responses: State content correctly Electronic Signature(s) Signed: 03/02/2023 4:20:37 PM By: Redmond Pulling RN, BSN Entered By: Redmond Pulling on 03/02/2023 14:01:53 -------------------------------------------------------------------------------- Wound Assessment Details Patient Name: [name]: [name] [name] 03/02/2023 12:30 PM Medical Record Number: [id number] Patient Account Number: [id number] Date of Birth/Sex: Treating RN: 02/26/1941 (82 y.o. [name] Primary Care Jazmine Heckman: [name] Other Clinician: Referring Jaryn Rosko: Treating Reeves Musick/Extender: [name] Weeks in Treatment: 0 Wound Status Wound Number: 11 Primary Venous Leg Ulcer Etiology: Wound Location: Left, Anterior Lower Leg Wound Open Wounding Event: Blister Status: Date Acquired: 02/16/2023 Comorbid Cataracts, Chronic sinus problems/congestion, Asthma, Sleep Weeks Of Treatment: 0 History: Apnea, Hypertension, Peripheral Venous Disease, Osteoarthritis, Clustered  Wound: Yes Neuropathy Photos Wound Measurements Length: (cm) 3.5 Width: (cm) 1 Depth: (cm) 0.1 Clustered Quantity: 2 Area: (cm) 2.749 Volume: (cm) 0.275 % Reduction in Area: % Reduction in Volume: Epithelialization: None Tunneling: No Undermining: No Wound Description Classification: Full Thickness Without Exposed Support Structures Exudate Amount: Medium Exudate Type: Serosanguineous Exudate Color: red, brown SHAMUS, DESANTIS ([id number]) Foul Odor After Cleansing: No Slough/Fibrino Yes [phone].pdf Page 7 of 11 Wound Bed Granulation Amount: Large (67-100%) Exposed Structure Granulation Quality: Red, Pink Fascia Exposed: No Necrotic Amount: Small (1-33%) Fat Layer (Subcutaneous Tissue) Exposed: Yes Necrotic Quality: Adherent Slough Tendon Exposed: No Muscle Exposed: No Joint Exposed: No Bone Exposed: No Periwound Skin Texture Texture Color No Abnormalities Noted: No No Abnormalities Noted: No Moisture No Abnormalities Noted: No Treatment Notes Wound #11 (Lower  Leg) Wound Laterality: Left, Anterior Cleanser Soap and Water Discharge Instruction: May shower and wash wound with dial antibacterial soap and water prior to dressing change. Vashe 5.8 (oz) Discharge Instruction: Cleanse the wound with Vashe prior to applying a clean dressing using gauze sponges, not tissue or cotton balls. Peri-Wound Care Sween Lotion (Moisturizing lotion) Discharge Instruction: Apply moisturizing lotion as directed Topical Gentamicin Discharge Instruction: As directed by physician Mupirocin Ointment Discharge Instruction: Apply Mupirocin (Bactroban) as instructed Primary Dressing Maxorb Extra Ag+ Alginate Dressing, 2x2 (in/in) Discharge Instruction: Apply to wound bed as instructed Secondary Dressing ABD Pad, 8x10 Discharge Instruction: Apply over primary dressing as directed. Secured With Compression Wrap Urgo K2 Lite, two layer compression system,  regular Discharge Instruction: Apply Urgo K2 Lite as directed (alternative to 3 layer compression). Compression Stockings Add-Ons Electronic Signature(s) Signed: 03/02/2023 4:20:37 PM By: Redmond Pulling RN, BSN Entered By: Redmond Pulling on 03/02/2023 13:49:16 -------------------------------------------------------------------------------- Wound Assessment Details Patient Name: [name]: [name] [name] 03/02/2023 12:30 PM Medical Record Number: [id number] Patient Account Number: [id number] Date of Birth/Sex: Treating RN: 01-01-1941 (82 y.o. [name] Primary Care Chrishana Spargur: [name] Other Clinician: Referring Alexah Kivett: Treating Sheniqua Carolan/Extender: [name] Weeks in Treatment: 0 Wound Status Wound Number: 12 Primary Venous Leg Ulcer Etiology: Wound Location: Right, Proximal, Anterior Lower Leg VALLEN, CALABRESE ([id number]) [phone].pdf Page 8 of 11 Wound Open Wounding Event: Blister Status: Date Acquired: 02/16/2023 Comorbid Cataracts, Chronic sinus problems/congestion, Asthma, Sleep Weeks Of Treatment: 0 History: Apnea, Hypertension, Peripheral Venous Disease, Osteoarthritis, Clustered Wound: No Neuropathy Photos Wound Measurements Length: (cm) 1.8 Width: (cm) 1 Depth: (cm) 0.1 Area: (cm) 1.414 Volume: (cm) 0.141 % Reduction in Area: % Reduction in Volume: Epithelialization: None Tunneling: No Undermining: No Wound Description Classification: Full Thickness Without Exposed Support Structures Exudate Amount: Medium Exudate Type: Serosanguineous Exudate Color: red, brown Foul Odor After Cleansing: No Slough/Fibrino Yes Wound Bed Granulation Amount: Medium (34-66%) Exposed Structure Granulation Quality: Red, Pink Fascia Exposed: No Necrotic Amount: Medium (34-66%) Fat Layer (Subcutaneous Tissue) Exposed: Yes Necrotic Quality: Adherent Slough Tendon Exposed: No Muscle Exposed: No Joint  Exposed: No Bone Exposed: No Periwound Skin Texture Texture Color No Abnormalities Noted: No No Abnormalities Noted: No Moisture No Abnormalities Noted: No Treatment Notes Wound #12 (Lower Leg) Wound Laterality: Right, Anterior, Proximal Cleanser Soap and Water Discharge Instruction: May shower and wash wound with dial antibacterial soap and water prior to dressing change. Vashe 5.8 (oz) Discharge Instruction: Cleanse the wound with Vashe prior to applying a clean dressing using gauze sponges, not tissue or cotton balls. Peri-Wound Care Sween Lotion (Moisturizing lotion) Discharge Instruction: Apply moisturizing lotion as directed Topical Gentamicin Discharge Instruction: As directed by physician Mupirocin Ointment Discharge Instruction: Apply Mupirocin (Bactroban) as instructed Primary Dressing Maxorb Extra Ag+ Alginate Dressing, 2x2 (in/in) Discharge Instruction: Apply to wound bed as instructed AYDRIEN, FROMAN ([id number]) [id number] Page 9 of 11 Secondary Dressing ABD Pad, 8x10 Discharge Instruction: Apply over primary dressing as directed. Secured With Compression Wrap Urgo K2 Lite, two layer compression system, regular Discharge Instruction: Apply Urgo K2 Lite as directed (alternative to 3 layer compression). Compression Stockings Add-Ons Electronic Signature(s) Signed: 03/02/2023 4:20:37 PM By: Redmond Pulling RN, BSN Entered By: Redmond Pulling on 03/02/2023 13:49:51 -------------------------------------------------------------------------------- Wound Assessment Details Patient Name: [name]: [name] [name] 03/02/2023 12:30 PM Medical Record Number: [id number] Patient Account Number: [id number] Date of Birth/Sex: Treating RN: 09-Dec-1940 (82 y.o. [name] Primary Care Shenekia Riess: [name]  Other Clinician: Referring Omarius Grantham: Treating Donne Robillard/Extender: [name] Weeks in Treatment:  0 Wound Status Wound Number: 13 Primary Venous Leg Ulcer Etiology: Wound Location: Right, Distal, Anterior Lower Leg Wound Open Wounding Event: Blister Status: Date Acquired: 02/16/2023 Comorbid Cataracts, Chronic sinus problems/congestion, Asthma, Sleep Weeks Of Treatment: 0 History: Apnea, Hypertension, Peripheral Venous Disease, Osteoarthritis, Clustered Wound: No Neuropathy Photos Wound Measurements Length: (cm) 1 Width: (cm) 0.8 Depth: (cm) 0.1 Area: (cm) 0.628 Volume: (cm) 0.063 % Reduction in Area: % Reduction in Volume: Epithelialization: None Tunneling: No Undermining: No Wound Description Classification: Full Thickness Without Exposed Support Structures Exudate Amount: Medium Exudate Type: Serosanguineous Exudate Color: red, brown Foul Odor After Cleansing: No Slough/Fibrino Yes Wound Bed Granulation Amount: Small (1-33%) Exposed Structure Granulation Quality: Red, Pink Fascia Exposed: No Necrotic Amount: Large (67-100%) Fat Layer (Subcutaneous Tissue) Exposed: Yes Necrotic Quality: Adherent Slough Tendon Exposed: No Muscle Exposed: No Joint Exposed: No Bone Exposed: No SAMAD, THON ([id number]) [id number] Page 10 of 11 Periwound Skin Texture Texture Color No Abnormalities Noted: No No Abnormalities Noted: No Moisture No Abnormalities Noted: No Treatment Notes Wound #13 (Lower Leg) Wound Laterality: Right, Anterior, Distal Cleanser Soap and Water Discharge Instruction: May shower and wash wound with dial antibacterial soap and water prior to dressing change. Vashe 5.8 (oz) Discharge Instruction: Cleanse the wound with Vashe prior to applying a clean dressing using gauze sponges, not tissue or cotton balls. Peri-Wound Care Sween Lotion (Moisturizing lotion) Discharge Instruction: Apply moisturizing lotion as directed Topical Gentamicin Discharge Instruction: As directed by physician Mupirocin Ointment Discharge  Instruction: Apply Mupirocin (Bactroban) as instructed Primary Dressing Maxorb Extra Ag+ Alginate Dressing, 2x2 (in/in) Discharge Instruction: Apply to wound bed as instructed Secondary Dressing ABD Pad, 8x10 Discharge Instruction: Apply over primary dressing as directed. Secured With Compression Wrap Urgo K2 Lite, two layer compression system, regular Discharge Instruction: Apply Urgo K2 Lite as directed (alternative to 3 layer compression). Compression Stockings Add-Ons Electronic Signature(s) Signed: 03/02/2023 4:20:37 PM By: Redmond Pulling RN, BSN Entered By: Redmond Pulling on 03/02/2023 13:50:20 -------------------------------------------------------------------------------- Vitals Details Patient Name: [name]: [name] [name] 03/02/2023 12:30 PM Medical Record Number: [id number] Patient Account Number: [id number] Date of Birth/Sex: Treating RN: 19-May-1941 (82 y.o. M) Primary Care Jude Linck: [name] Other Clinician: Referring Marja Adderley: Treating Angel Weedon/Extender: [name] Weeks in Treatment: 0 Vital Signs Time Taken: 12:56 Temperature (F): 97.7 Height (in): 70 Pulse (bpm): 82 Source: Stated Respiratory Rate (breaths/min): 20 Weight (lbs): 250 Blood Pressure (mmHg): 165/82 Source: Stated Reference Range: 80 - 120 mg / dl Body Mass Index (BMI): 35.9 Electronic Signature(s) Signed: 03/02/2023 4:08:22 PM By: Thayer Dallas Entered By: Thayer Dallas on 03/02/2023 12:58:35 [name] ([id number]) [id number] Page 11 of 11

## 2023-03-02 NOTE — Progress Notes (Signed)
RAYLEE, ADAMEC ([id number]) [phone] [id number] Page 1 of 4 Visit Report for 03/02/2023 Abuse Risk Screen Details Patient Name: [name]: [name], [name] [name] 03/02/2023 12:30 PM Medical Record Number: [id number] Patient Account Number: [id number] Date of Birth/Sex: Treating RN: Hanson[birth year] (82 y.o. M) Primary Care [name]: [name] Other Clinician: Referring [name]: Treating [name]/Extender: [name] Weeks in Treatment: 0 Abuse Risk Screen Items Answer ABUSE RISK SCREEN: Has anyone close to you tried to hurt or harm you recentlyo No Do you feel uncomfortable with anyone in your familyo No Has anyone forced you do things that you didnt want to doo No Electronic Signature(s) Signed: 03/02/2023 4:08:22 PM By: [name] Entered By: [name] on 03/02/2023 13:15:39 -------------------------------------------------------------------------------- Activities of Daily Living Details Patient Name: [name]: [name], [name] [name] 03/02/2023 12:30 PM Medical Record Number: [id number] Patient Account Number: [id number] Date of Birth/Sex: Treating RN: [name] (82 y.o. M) Primary Care [name]: [name] Other Clinician: Referring Zaharah Amir: Treating [name]/Extender: [name] Weeks in Treatment: 0 Activities of Daily Living Items Answer Activities of Daily Living (Please select one for each item) Drive Automobile Completely Able T Medications ake Completely Able Use T elephone Completely Able Care for Appearance Completely Able Use T oilet Completely Able Bath / Shower Completely Able Dress Self Completely Able Feed Self Completely Able Walk Completely Able Get In / Out Bed Completely Able Housework Completely Able Prepare Meals Completely Able Handle Money Completely Able Shop for Self Completely Able Electronic Signature(s) Signed: 03/02/2023 4:08:22 PM By: [name] Entered By: [name] on 03/02/2023 13:16:03 -------------------------------------------------------------------------------- Education Screening Details Patient Name: [name]: [name] [name] 03/02/2023 12:30 PM Medical Record Number: [id number] Patient Account Number: [id number] Date of Birth/Sex: Treating RN: [birth year] (82 y.o. M) Primary Care [name]: [name] Other Clinician: Referring [name]: Treating [name]/Extender: [name] Weeks in Treatment: [address] [name], [name] ([id number]) [id number] Page 2 of 4 Primary Learner Assessed: Patient Learning Preferences/Education Level/Primary Language Learning Preference: Explanation, Demonstration, Printed Material Highest Education Level: College or Above Preferred Language: Economist Language Barrier: No Translator Needed: No Memory Deficit: No Emotional Barrier: No Cultural/Religious Beliefs Affecting Medical Care: No Physical Barrier Impaired Vision: No Impaired Hearing: No Decreased Hand dexterity: No Knowledge/Comprehension Knowledge Level: High Comprehension Level: High Ability to understand written instructions: High Ability to understand verbal instructions: High Motivation Anxiety Level: Calm Cooperation: Cooperative Education Importance: Acknowledges Need Interest in Health Problems: Asks Questions Perception: Coherent Willingness to Engage in Self-Management High Activities: Readiness to Engage in Self-Management High Activities: Electronic Signature(s) Signed: 03/02/2023 4:08:22 PM By: [name] Entered By: [name] on 03/02/2023 13:16:55 -------------------------------------------------------------------------------- Fall Risk Assessment Details Patient Name: [name]: [name] [name] 03/02/2023 12:30 PM Medical Record Number: [id number] Patient Account Number: [id number] Date of  Birth/Sex: Treating RN: Hans[birth year] (82 y.o. M) Primary Care Crescentia Boutwell: [name] Other Clinician: Referring Evellyn Tuff: Treating Zeba Luby/Extender: [name] Weeks in Treatment: 0 Fall Risk Assessment Items Have you had 2 or more falls in the last 12 monthso 0 Yes Have you had any fall that resulted in injury in the last 12 monthso 0 Yes FALLS RISK SCREEN History of falling - immediate or within 3 months 25 Yes Secondary diagnosis (Do you have 2 or more medical diagnoseso) 0 No Ambulatory aid None/bed rest/wheelchair/nurse 0 No Crutches/cane/walker 15 Yes Furniture 0 No Intravenous therapy Access/Saline/Heparin Lock 0 No  Gait/Transferring Normal/ bed rest/ wheelchair 0 No Weak (short steps with or without shuffle, stooped but able to lift head while walking, may seek 10 Yes support from furniture) Impaired (short steps with shuffle, may have difficulty arising from chair, head down, impaired 0 No balance) Mental Status Oriented to own ability 0 No Overestimates or forgets limitations 0 No Risk Level: Medium Risk Score: [address] ([id number]) [phone] [id number] Page 3 of 4 Electronic Signature(s) -------------------------------------------------------------------------------- Foot Assessment Details Patient Name: [name]: [name], [name] [name] 03/02/2023 12:30 PM Medical Record Number: [id number] Patient Account Number: [id number] Date of Birth/Sex: Treating RN: [birth year] (82 y.o. [name] Primary Care Delona Clasby: [name] Other Clinician: Referring Demia Viera: Treating Bronsen Serano/Extender: [name] Weeks in Treatment: 0 Foot Assessment Items Site Locations + = Sensation present, - = Sensation absent, C = Callus, U = Ulcer R = Redness, W = Warmth, M = Maceration, PU = Pre-ulcerative lesion F = Fissure, S = Swelling, D = Dryness Assessment Right: Left: Other Deformity:  No No Prior Foot Ulcer: No No Prior Amputation: No No Charcot Joint: No No Ambulatory Status: Gait: Electronic Signature(s) Signed: 03/02/2023 4:20:37 PM By: Redmond Pulling RN, BSN Entered By: Redmond Pulling on 03/02/2023 13:53:58 -------------------------------------------------------------------------------- Nutrition Risk Screening Details Patient Name: [name]: [name] [name] 03/02/2023 12:30 PM Medical Record Number: [id number] Patient Account Number: [id number] Date of Birth/Sex: Treating RN: [birth year]anson (82 y.o. M) Primary Care [place] Goza: [name] Other Clinician: Referring [place]: Treating Andrena Margerum/Extender: [name] Weeks in Treatment: 0 Height (in): 70 Weight (lbs): 250 Body Mass Index (BMI): 35.9 [name], [name] ([id number]) [id number] Page 4 of 4 Nutrition Risk Screening Items Score Screening NUTRITION RISK SCREEN: I have an illness or condition that made me change the kind and/or amount of food I eat 0 No I eat fewer than two meals per day 0 No I eat few fruits and vegetables, or milk products 0 No I have three or more drinks of beer, liquor or wine almost every day 0 No I have tooth or mouth problems that make it hard for me to eat 0 No I don't always have enough money to buy the food I need 0 No I eat alone most of the time 1 Yes I take three or more different prescribed or over-the-counter drugs a day 1 Yes Without wanting to, I have lost or gained 10 pounds in the last six months 2 Yes I am not always physically able to shop, cook and/or feed myself 0 No Nutrition Protocols Good Risk Protocol Moderate Risk Protocol 0 Provide education on nutrition High Risk Proctocol Risk Level: Moderate Risk Score: 4 Electronic Signature(s) Signed: 03/02/2023 4:08:22 PM By: [name] Entered By: [name] on 03/02/2023 13:23:28

## 2023-03-03 NOTE — Progress Notes (Signed)
BRADELY, RUDIN ([id number]) [id number] Page 1 of 12 Visit Report for 03/02/2023 Chief Complaint Document Details Patient Name: [name]: [name], [name] 03/02/2023 12:30 PM Medical Record Number: [id number] Patient Account Number: [id number] Date of Birth/Sex: Treating RN: 1941/04/28 (82 y.o. M) Primary Care Provider: [name] Other Clinician: Referring Provider: Treating Provider/Extender: [name] Weeks in Treatment: 0 Information Obtained from: Patient Chief Complaint 02/07/2021; patient is here for review of a wound on the tip of his right great toe 03/19/2022: re-opening of the right great toe wound, with additional wounds on medial aspect of right great toe and right anterior tibial surface 07/10/2022: Partial dehiscence of right great toe amputation site, pressure ulcer of anterior right ankle 03/02/2023; bilateral lower extremity wounds Electronic Signature(s) Signed: 03/02/2023 3:35:10 PM By: Geralyn Corwin DO Entered By: Geralyn Corwin on 03/02/2023 14:04:03 -------------------------------------------------------------------------------- HPI Details Patient Name: [name]: [name] [name] 03/02/2023 12:30 PM Medical Record Number: [id number] Patient Account Number: [id number] Date of Birth/Sex: Treating RN: 1941-02-25 (82 y.o. M) Primary Care Provider: [name] Other Clinician: Referring Provider: Treating Provider/Extender: [name] Weeks in Treatment: 0 History of Present Illness HPI Description: ADMISSION 02/07/2021 This is a 82 year old man who is not a diabetic "prediabetic" who comes in today letter asking [place] to look at an area on his right first toe tip that has been there about a year. He has been getting this dressed that Eagle and he had Xeroform on this with gauze. The patient is not exactly sure how this came about but he is not able to dress the  wound himself because of limitations due to what sounds like spinal stenosis and pain. He had a wound on the ankle as well but that healed over. He was seen by vascular in May 2021. This was related to a right great toe wound. He had an angiogram that was completely normal on both sides good perfusion right into the feet. He also looks like he has lymphedema and he has compression stockings but he cannot get them on and off so he essentially leaves he is on even when he showering Past medical history includes prediabetes, low back pain secondary I think the lumbar spinal stenosis has been offered a decompressive laminectomy but he has not gone forth with this. 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 ABI was noncompressible in our clinic on the right right first toe. 4/7; patient I admitted the clinic last week. He has an area on the tip of his right first toe. We cleaned this off last week and have been using silver collagen. He is in the same crocs today that he wore last week and I told him not to use. For 1 reason or another he could not handle a surgical sandal. 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 He is not a diabetic 4/12; right first toe wound chronic. We have been using silver collagen. Once again he comes in then the same pair of old crocs that he comes in every week. 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 His x-ray suggested suspicious for osteomyelitis with bone irregularity at the tuft of the right great toe 4/21; patient has been using silver collagen every other day to the wound on his right great toe. He continues  to wear crocs. He has no complaints or issues today. 5/5; 2-week follow-up. 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 Mild soft tissue edema with enhancement at the  distal aspect of the great toe suggestive of cellulitis. Fortunately the wound actually looks better. Has been using Hydrofera Blue 5/12; started him on Augmentin last week 875 twice daily he is tolerating this well. Last creatinine I see is 1.77 in early April. We are using Hydrofera Blue to the wound which actually looks better today. 5/17; tolerating the Augmentin well. He says the wound is actually been hurting him episodically. Actually the surface of this looks quite good. He did not get his lab work done. We are using [facility] he is changing this himself 6/2; still taking Augmentin he should be rounding into his last week. This was empirically for underlying osteomyelitis. The area on the tip of his toe is still open still with some depth but no palpable bone. We have been using Hydrofera Blue. Surface area of the wound has not been making much improvement Is work was essentially unremarkable except for a creatinine of 1.79. Reason for his renal insufficiency is not clear he is not a diabetic does not have hypertension however he does take NSAIDs and Lasix I wonder if this is the issue here. Note that his creatinine on 11/12/2018 was 1.45. Platelet count slightly low at 143 however his inflammatory markers were really in the normal range at 10 and 2 sedimentation rate and C-reactive protein respectively. MAHMOOD, BOEHRINGER ([id number]) [id number] Page 2 of 12 6/9; he apparently had another 2 weeks of Augmentin after we look that this last time. Therefore he should be coming into his last week next week. We are using polymen on the tip of the toe. He cannot get down to do the dressing so he comes in for a nurse visit. He comes in in crocs I have warned him against this although he says he is using a different shoe for most of the day he works in [employer]. 6/16; he comes back in in crocs. I have spoken him about this before. His areas on the tip of his toe this  actually looks quite good we have been using polymen. He has completed his antibiotics which we empirically gave him for underlying osteomyelitis. Everything looks better to me in spite of his noncompliance with footwear recommendation 7/11; patient has been followed through nurse visits. He was last seen by provider on 6/16. He reports improvement to the wound with the use of PolyMem. He denies signs of infection. 7/21; the patient arrives with the area on the tip of his right great toe completely healed. He has the same proximal on that I see every time. I treated him empirically with Augmentin for osteomyelitis. He needs to be vigilant about this. READMISSION 03/19/2022 The patient returns with a reopening of the right great toe wound. Apparently this occurred in around October of last year. He was followed by podiatry for some time. They have performed x-rays but unfortunately, we do not have access to them or the results. No report as to whether or not he might have osteomyelitis. In addition, he has a new wound on the medial aspect of the right great toe as well as the anterior tibial surface of his right lower leg. He does state that he was wearing compression stockings when this tibial wound occurred. He continues to wear crocs. 04/02/2022:  For some reason, the patient has not been seen in 2 weeks. He says that he had to reschedule his visit. Therefore his Coflex compression wrap was left in situ for 14 days. It appears that his skin became macerated and he has opened up several new superficial wounds on his right anterior tibial surface. We did get an x-ray to evaluate for osteomyelitis and none was seen. All of the pre-existing wounds are smaller today and the new wounds look like they are limited to just the breakdown of skin. 04/10/2022: The wounds on his right anterior tibial surface are actually a little bit larger today. He seems to have some skin irritation and maceration in the  area again. The tip of his toe is nearly closed as is the wound on the dorsal surface of his right great toe. READMISSION 07/10/2022 Since our last visit, the patient went to the operating room to have a left hip replacement, but in the preop holding area, his surgeon noted a foul odor coming from his right foot. He contacted me and described the findings of his right great toe and we mutually agreed, along with the patient, that amputation would be most appropriate. 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 Since his surgery, he has been wearing compression stockings bilaterally, but as he is unable to apply or remove them himself, he wears them for a week at a time. This appears to have resulted in a pressure injury on his right anterior ankle at the point of flexion; he has a similar looking abrasion that has not broken the skin in the same site on the left. 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 07/21/2022: The right anterior ankle wound is nearly closed aside from a small open area that has some slough accumulation. The amputation site has crusting and eschar present. Once this was removed, it was revealed that the amputation site has healed. The abrasion on the left anterior ankle at the point of flexion is no longer present. 07/28/2022: The patient dropped a door on his leg and has a new wound to his right anterior tibial surface. There is some nonviable skin hanging from the cranial portion of the wound. The fat layer is exposed. The right anterior ankle wound is a little bit smaller and has some accumulated slough present. 08/04/2022: The wound on his right anterior tibial surface is a little bit smaller today. There is still slough accumulation. The right anterior ankle wound is quite a bit smaller today and also has slough buildup on the surface. 08/13/2022: The patient dropped another object that  struck him in the left anterior tibial surface. This has resulted in wound at this location. It is fairly superficial but does involve the fat layer. The right anterior tibial surface wound has a little bit of eschar on the surface and underneath, it is quite small. The right anterior ankle wound has also contracted but does have slough and eschar buildup as well. 08/21/2022: The right anterior tibial wound has healed. The left anterior tibial wound is quite a bit smaller and more superficial with just a little eschar on the surface. The right anterior ankle wound also continues to contract but continues to accumulate slough and eschar, as well. 08/27/2022: The left anterior tibial wound is closed. The right anterior ankle wound is considerably smaller with just a little eschar and slough buildup. 09/03/2022: His wounds are  all healed. 03/02/2023 Mr. Carnell Beavers is an 82 year old male with a past medical history of venous insufficiency that presents to clinic for a 1 month history of nonhealing wounds to his lower extremities bilaterally. He states that these occurred spontaneously. He was seen by vein and vascular who placed an Unna boot on the right and a compression stocking on the left. He has had this on for the past week. Prior to this he has compression stockings on that he leaves in place for weeks at a time. It is hard for him to take these on and off. He currently denies signs of infection. Unclear if the dressing has been used to the wound beds. Electronic Signature(s) Signed: 03/02/2023 3:35:10 PM By: Geralyn Corwin DO Entered By: Geralyn Corwin on 03/02/2023 14:06:29 -------------------------------------------------------------------------------- Physical Exam Details Patient Name: [name]: [name] [name] 03/02/2023 12:30 PM Medical Record Number: [id number] Patient Account Number: [id number] Date of Birth/Sex: Treating RN: 1941/06/13 (82 y.o. M) Primary Care Provider:  [name] Other Clinician: Referring Provider: Treating Provider/Extender: [name] Weeks in Treatment: 0 Constitutional respirations regular, non-labored and within target range for patient.. Cardiovascular 2+ dorsalis pedis/posterior tibialis pulses. Psychiatric TAYON, PAREKH ([id number]) [id number] Page 3 of 12 pleasant and cooperative. Notes Bilateral lower extremity wounds with granulation tissue and scant nonviable tissue. 2+ pitting edema to the knee. Venous stasis dermatitis. No signs of surrounding infection including increased warmth, erythema or purulent drainage. Electronic Signature(s) Signed: 03/02/2023 3:35:10 PM By: Geralyn Corwin DO Entered By: Geralyn Corwin on 03/02/2023 14:07:06 -------------------------------------------------------------------------------- Physician Orders Details Patient Name: [name]: [name] [name] 03/02/2023 12:30 PM Medical Record Number: [id number] Patient Account Number: [id number] Date of Birth/Sex: Treating RN: 1941/10/14 (82 y.o. [name] Primary Care Provider: [name] Other Clinician: Referring Provider: Treating Provider/Extender: [name] Weeks in Treatment: 0 Verbal / Phone Orders: No Diagnosis Coding ICD-10 Coding Code Description [id number] Non-pressure chronic ulcer of other part of right lower leg with fat layer exposed L97.822 Non-pressure chronic ulcer of other part of left lower leg with fat layer exposed I87.313 Chronic venous hypertension (idiopathic) with ulcer of bilateral lower extremity N18.30 Chronic kidney disease, stage 3 unspecified Follow-up Appointments ppointment in 1 week. - Dr Mikey Bussing - Room 7 Return A Anesthetic Wound #11 Left,Anterior Lower Leg (In clinic) Topical Lidocaine 4% applied to wound bed Wound #12 Right,Proximal,Anterior Lower Leg (In clinic) Topical Lidocaine 4% applied to  wound bed Wound #13 Right,Distal,Anterior Lower Leg (In clinic) Topical Lidocaine 4% applied to wound bed Bathing/ Shower/ Hygiene May shower with protection but do not get wound dressing(s) wet. Protect dressing(s) with water repellant cover (for example, large plastic bag) or a cast cover and may then take shower. Edema Control - Lymphedema / SCD / Other Bilateral Lower Extremities Elevate legs to the level of the heart or above for 30 minutes daily and/or when sitting for 3-4 times a day throughout the day. Avoid standing for long periods of time. Wound Treatment Wound #11 - Lower Leg Wound Laterality: Left, Anterior Cleanser: Soap and Water 1 x Per Week/30 Days Discharge Instructions: May shower and wash wound with dial antibacterial soap and water prior to dressing change. Cleanser: Vashe 5.8 (oz) 1 x Per Week/30 Days Discharge Instructions: Cleanse the wound with Vashe prior to applying a clean dressing using gauze sponges, not tissue or cotton balls. Peri-Wound Care: Sween Lotion (Moisturizing lotion) 1 x Per Week/30 Days Discharge Instructions: Apply  moisturizing lotion as directed Topical: Gentamicin 1 x Per Week/30 Days Discharge Instructions: As directed by physician Topical: Mupirocin Ointment 1 x Per Week/30 Days Discharge Instructions: Apply Mupirocin (Bactroban) as instructed Prim Dressing: Maxorb Extra Ag+ Alginate Dressing, 2x2 (in/in) 1 x Per Week/30 Days ary Discharge Instructions: Apply to wound bed as instructed Secondary Dressing: ABD Pad, 8x10 1 x Per Week/30 Days DEAVEN, URWIN ([id number]) [id number] Page 4 of 12 Discharge Instructions: Apply over primary dressing as directed. Compression Wrap: Urgo K2 Lite, two layer compression system, regular 1 x Per Week/30 Days Discharge Instructions: Apply Urgo K2 Lite as directed (alternative to 3 layer compression). Wound #12 - Lower Leg Wound Laterality: Right, Anterior, Proximal Cleanser:  Soap and Water 1 x Per Week/30 Days Discharge Instructions: May shower and wash wound with dial antibacterial soap and water prior to dressing change. Cleanser: Vashe 5.8 (oz) 1 x Per Week/30 Days Discharge Instructions: Cleanse the wound with Vashe prior to applying a clean dressing using gauze sponges, not tissue or cotton balls. Peri-Wound Care: Sween Lotion (Moisturizing lotion) 1 x Per Week/30 Days Discharge Instructions: Apply moisturizing lotion as directed Topical: Gentamicin 1 x Per Week/30 Days Discharge Instructions: As directed by physician Topical: Mupirocin Ointment 1 x Per Week/30 Days Discharge Instructions: Apply Mupirocin (Bactroban) as instructed Prim Dressing: Maxorb Extra Ag+ Alginate Dressing, 2x2 (in/in) 1 x Per Week/30 Days ary Discharge Instructions: Apply to wound bed as instructed Secondary Dressing: ABD Pad, 8x10 1 x Per Week/30 Days Discharge Instructions: Apply over primary dressing as directed. Compression Wrap: Urgo K2 Lite, two layer compression system, regular 1 x Per Week/30 Days Discharge Instructions: Apply Urgo K2 Lite as directed (alternative to 3 layer compression). Wound #13 - Lower Leg Wound Laterality: Right, Anterior, Distal Cleanser: Soap and Water 1 x Per Week/30 Days Discharge Instructions: May shower and wash wound with dial antibacterial soap and water prior to dressing change. Cleanser: Vashe 5.8 (oz) 1 x Per Week/30 Days Discharge Instructions: Cleanse the wound with Vashe prior to applying a clean dressing using gauze sponges, not tissue or cotton balls. Peri-Wound Care: Sween Lotion (Moisturizing lotion) 1 x Per Week/30 Days Discharge Instructions: Apply moisturizing lotion as directed Topical: Gentamicin 1 x Per Week/30 Days Discharge Instructions: As directed by physician Topical: Mupirocin Ointment 1 x Per Week/30 Days Discharge Instructions: Apply Mupirocin (Bactroban) as instructed Prim Dressing: Maxorb Extra Ag+ Alginate Dressing,  2x2 (in/in) 1 x Per Week/30 Days ary Discharge Instructions: Apply to wound bed as instructed Secondary Dressing: ABD Pad, 8x10 1 x Per Week/30 Days Discharge Instructions: Apply over primary dressing as directed. Compression Wrap: Urgo K2 Lite, two layer compression system, regular 1 x Per Week/30 Days Discharge Instructions: Apply Urgo K2 Lite as directed (alternative to 3 layer compression). Consults Podiatry - Refer to [facility] for nail clipping Patient Medications llergies: No Known Allergies A Notifications Medication Indication Start End prior to debridement 03/02/2023 lidocaine DOSE topical 4 % cream - cream topical once daily Electronic Signature(s) Signed: 03/02/2023 3:35:10 PM By: Geralyn Corwin DO Signed: 03/02/2023 4:20:37 PM By: Redmond Pulling RN, BSN Entered By: Redmond Pulling on 03/02/2023 14:42:24 [name] ([id number]) [id number] Page 5 of 12 Prescription 03/02/2023 -------------------------------------------------------------------------------- [name]. Geralyn Corwin DO Patient Name: Provider: 02/20/1941 [id number] Date of Birth: NPI#: Judie Petit [id number] Sex: DEA #: [phone] Phone #: License #: UPN: Patient Address: [address] Eligha Bridegroom [facility] [place], [place] [address] [address]  [address] [place], [place] [address] [phone] Allergies No Known Allergies Provider's Orders Podiatry - Refer to [facility] for nail clipping Hand Signature: Date(s): Electronic Signature(s) Signed: 03/02/2023 3:35:10 PM By: Geralyn Corwin DO Signed: 03/02/2023 4:20:37 PM By: Redmond Pulling RN, BSN Entered By: Redmond Pulling on 03/02/2023 14:42:25 -------------------------------------------------------------------------------- Problem List Details Patient Name: [name]: [name] [name] 03/02/2023 12:30 PM Medical Record Number:  [id number] Patient Account Number: [id number] Date of Birth/Sex: Treating RN: [birth year] (82 y.o. M) Primary Care Provider: [name] Other Clinician: Referring Provider: Treating Provider/Extender: [name] Weeks in Treatment: 0 Active Problems ICD-10 Encounter Code Description Active Date MDM Diagnosis [phone] Non-pressure chronic ulcer of other part of right lower leg with fat layer 03/02/2023 No Yes exposed L97.822 Non-pressure chronic ulcer of other part of left lower leg with fat layer exposed4/22/2024 No Yes I87.313 Chronic venous hypertension (idiopathic) with ulcer of bilateral lower extremity 03/02/2023 No Yes N18.30 Chronic kidney disease, stage 3 unspecified 03/02/2023 No Yes Inactive Problems Resolved Problems Electronic Signature(s) Signed: 03/02/2023 3:35:10 PM By: Laurell Roof ([id number]) [id number] Page 6 of 12 Entered By: Geralyn Corwin on 03/02/2023 14:03:31 -------------------------------------------------------------------------------- Progress Note Details Patient Name: [name]: CAISEN, MANGAS [name] 03/02/2023 12:30 PM Medical Record Number: [id number] Patient Account Number: [id number] Date of Birth/Sex: Treating RN: 1941/05/15 (82 y.o. M) Primary Care Provider: [name] Other Clinician: Referring Provider: Treating Provider/Extender: [name] Weeks in Treatment: 0 Subjective Chief Complaint Information obtained from Patient 02/07/2021; patient is here for review of a wound on the tip of his right great toe 03/19/2022: re-opening of the right great toe wound, with additional wounds on medial aspect of right great toe and right anterior tibial surface 07/10/2022: Partial dehiscence of right great toe amputation site, pressure ulcer of anterior right ankle 03/02/2023; bilateral lower extremity wounds History of Present Illness  (HPI) ADMISSION 02/07/2021 This is a 82 year old man who is not a diabetic "prediabetic" who comes in today letter asking [place] to look at an area on his right first toe tip that has been there about a year. He has been getting this dressed that [employer] and he had Xeroform on this with gauze. The patient is not exactly sure how this came about but he is not able to dress the wound himself because of limitations due to what sounds like spinal stenosis and pain. He had a wound on the ankle as well but that healed over. He was seen by vascular in May 2021. This was related to a right great toe wound. He had an angiogram that was completely normal on both sides good perfusion right into the feet. He also looks like he has lymphedema and he has compression stockings but he cannot get them on and off so he essentially leaves he is on even when he showering Past medical history includes prediabetes, low back pain secondary I think the lumbar spinal stenosis has been offered a decompressive laminectomy but he has not gone forth with this. 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 ABI was noncompressible in our clinic on the right right first toe. 4/7; patient I admitted the clinic last week. He has an area on the tip of his right first toe. We cleaned this off last week and have been using silver collagen. He is in the same crocs today that he  wore last week and I told him not to use. For 1 reason or another he could not handle a surgical sandal. 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 He is not a diabetic 4/12; right first toe wound chronic. We have been using silver collagen. Once again he comes in then the same pair of old crocs that he comes in every week. 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 His x-ray suggested suspicious for osteomyelitis with bone irregularity  at the tuft of the right great toe 4/21; patient has been using silver collagen every other day to the wound on his right great toe. He continues to wear crocs. He has no complaints or issues today. 5/5; 2-week follow-up. 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 Mild soft tissue edema with enhancement at the distal aspect of the great toe suggestive of cellulitis. Fortunately the wound actually looks better. Has been using Hydrofera Blue 5/12; started him on Augmentin last week 875 twice daily he is tolerating this well. Last creatinine I see is 1.77 in early April. We are using Hydrofera Blue to the wound which actually looks better today. 5/17; tolerating the Augmentin well. He says the wound is actually been hurting him episodically. Actually the surface of this looks quite good. He did not get his lab work done. We are using [facility] he is changing this himself 6/2; still taking Augmentin he should be rounding into his last week. This was empirically for underlying osteomyelitis. The area on the tip of his toe is still open still with some depth but no palpable bone. We have been using Hydrofera Blue. Surface area of the wound has not been making much improvement Is work was essentially unremarkable except for a creatinine of 1.79. Reason for his renal insufficiency is not clear he is not a diabetic does not have hypertension however he does take NSAIDs and Lasix I wonder if this is the issue here. Note that his creatinine on 11/12/2018 was 1.45. Platelet count slightly low at 143 however his inflammatory markers were really in the normal range at 10 and 2 sedimentation rate and C-reactive protein respectively. 6/9; he apparently had another 2 weeks of Augmentin after we look that this last time. Therefore he should be coming into his last week next week. We are using polymen on the tip of the toe. He cannot get down to do the  dressing so he comes in for a nurse visit. He comes in in crocs I have warned him against this although he says he is using a different shoe for most of the day he works in [employer]. 6/16; he comes back in in crocs. I have spoken him about this before. His areas on the tip of his toe this actually looks quite good we have been using polymen. He has completed his antibiotics which we empirically gave him for underlying osteomyelitis. Everything looks better to me in spite of his noncompliance with footwear recommendation 7/11; patient has been followed through nurse visits. He was last seen by provider on 6/16. He reports improvement to the wound with the use of PolyMem. He denies signs of infection. 7/21; the patient arrives with the area on the tip of his right great toe completely healed. He has the same proximal on that I see every time. I treated him empirically with Augmentin for  osteomyelitis. He needs to be vigilant about this. READMISSION 03/19/2022 The patient returns with a reopening of the right great toe wound. Apparently this occurred in around October of last year. He was followed by podiatry for some time. They have performed x-rays but unfortunately, we do not have access to them or the results. No report as to whether or not he might have osteomyelitis. In addition, he has a new wound on the medial aspect of the right great toe as well as the anterior tibial surface of his right lower leg. He does state that he was wearing compression stockings when this tibial wound occurred. He continues to wear crocs. 04/02/2022: For some reason, the patient has not been seen in 2 weeks. He says that he had to reschedule his visit. Therefore his Coflex compression wrap was left in situ for 14 days. It appears that his skin became macerated and he has opened up several new superficial wounds on his right anterior tibial surface. We did get an x-ray to evaluate for osteomyelitis and none was  seen. All of the pre-existing wounds are smaller today and the new wounds look like they are limited to just the breakdown of skin. 04/10/2022: The wounds on his right anterior tibial surface are actually a little bit larger today. He seems to have some skin irritation and maceration in the area again. The tip of his toe is nearly closed as is the wound on the dorsal surface of his right great toe. READMISSION 07/10/2022 TADARIUS, MALAND ([id number]) [id number] Page 7 of 12 Since our last visit, the patient went to the operating room to have a left hip replacement, but in the preop holding area, his surgeon noted a foul odor coming from his right foot. He contacted me and described the findings of his right great toe and we mutually agreed, along with the patient, that amputation would be most appropriate. 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 Since his surgery, he has been wearing compression stockings bilaterally, but as he is unable to apply or remove them himself, he wears them for a week at a time. This appears to have resulted in a pressure injury on his right anterior ankle at the point of flexion; he has a similar looking abrasion that has not broken the skin in the same site on the left. 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 07/21/2022: The right anterior ankle wound is nearly closed aside from a small open area that has some slough accumulation. The amputation site has crusting and eschar present. Once this was removed, it was revealed that the amputation site has healed. The abrasion on the left anterior ankle at the point of flexion is no longer present. 07/28/2022: The patient dropped a door on his leg and has a new wound to his right anterior tibial surface. There is some nonviable skin hanging from the cranial portion of the wound. The fat layer is exposed. The  right anterior ankle wound is a little bit smaller and has some accumulated slough present. 08/04/2022: The wound on his right anterior tibial surface is a little bit smaller today. There is still slough accumulation. The right anterior ankle wound is quite a bit smaller today and also has slough buildup on the surface. 08/13/2022: The patient dropped another object that struck him in the left anterior tibial surface. This has resulted in wound at this location.  It is fairly superficial but does involve the fat layer. The right anterior tibial surface wound has a little bit of eschar on the surface and underneath, it is quite small. The right anterior ankle wound has also contracted but does have slough and eschar buildup as well. 08/21/2022: The right anterior tibial wound has healed. The left anterior tibial wound is quite a bit smaller and more superficial with just a little eschar on the surface. The right anterior ankle wound also continues to contract but continues to accumulate slough and eschar, as well. 08/27/2022: The left anterior tibial wound is closed. The right anterior ankle wound is considerably smaller with just a little eschar and slough buildup. 09/03/2022: His wounds are all healed. 03/02/2023 Mr. [name] is an 82 year old male with a past medical history of venous insufficiency that presents to clinic for a 1 month history of nonhealing wounds to his lower extremities bilaterally. He states that these occurred spontaneously. He was seen by vein and vascular who placed an Unna boot on the right and a compression stocking on the left. He has had this on for the past week. Prior to this he has compression stockings on that he leaves in place for weeks at a time. It is hard for him to take these on and off. He currently denies signs of infection. Unclear if the dressing has been used to the wound beds. Patient History Information obtained from Patient. Allergies No Known  Allergies Family History Diabetes - Maternal Grandparents, Heart Disease - Father, Hypertension - Father, Stroke - Father, Thyroid Problems - Mother,Child, No family history of Cancer, Hereditary Spherocytosis, Kidney Disease, Lung Disease, Seizures, Tuberculosis. Social History Never smoker, Marital Status - Single, Alcohol Use - Rarely, Drug Use - No History, Caffeine Use - Daily - coffee. Medical History Eyes Patient has history of Cataracts - 2019 Denies history of Glaucoma, Optic Neuritis Ear/Nose/Mouth/Throat Patient has history of Chronic sinus problems/congestion Denies history of Middle ear problems Hematologic/Lymphatic Denies history of Anemia, Hemophilia, Human Immunodeficiency Virus, Lymphedema, Sickle Cell Disease Respiratory Patient has history of Asthma, Sleep Apnea Denies history of Aspiration, Chronic Obstructive Pulmonary Disease (COPD), Pneumothorax, Tuberculosis Cardiovascular Patient has history of Hypertension, Peripheral Venous Disease Denies history of Angina, Arrhythmia, Congestive Heart Failure, Coronary Artery Disease, Deep Vein Thrombosis, Hypotension, Myocardial Infarction, Peripheral Arterial Disease, Phlebitis, Vasculitis Gastrointestinal Denies history of Cirrhosis , Colitis, Crohnoos, Hepatitis A, Hepatitis B, Hepatitis C Endocrine Denies history of Type I Diabetes, Type II Diabetes Genitourinary Denies history of End Stage Renal Disease Immunological Denies history of Lupus Erythematosus, Raynaudoos, Scleroderma Integumentary (Skin) Denies history of History of Burn Musculoskeletal Patient has history of Osteoarthritis Denies history of Gout, Rheumatoid Arthritis, Osteomyelitis Neurologic Patient has history of Neuropathy Denies history of Dementia, Quadriplegia, Paraplegia, Seizure Disorder Oncologic Denies history of Received Chemotherapy, Received Radiation Psychiatric Denies history of Anorexia/bulimia, Confinement  Anxiety Hospitalization/Surgery History - right great toe amputation 2023. - right hip replacement 2023. - left knee replacement. - RSV jan 2024. Medical A Surgical History Notes nd Gastrointestinal GERD Genitourinary ARY, LAVINE ([id number]) [id number] Page 8 of 12 BPH Musculoskeletal Degenerative disc disease Neurologic Neuromuscular disorder Review of Systems (ROS) Eyes Denies complaints or symptoms of Dry Eyes, Vision Changes, Glasses / Contacts. Ear/Nose/Mouth/Throat Denies complaints or symptoms of Chronic sinus problems or rhinitis. Respiratory Complains or has symptoms of Shortness of Breath. Denies complaints or symptoms of Chronic or frequent coughs. Gastrointestinal Denies complaints or symptoms of Frequent diarrhea, Nausea, Vomiting. Genitourinary Denies complaints or symptoms  of Frequent urination. Integumentary (Skin) Complains or has symptoms of Wounds, BL Lower leg Musculoskeletal Complains or has symptoms of Muscle Weakness. Psychiatric Denies complaints or symptoms of Claustrophobia. Objective Constitutional respirations regular, non-labored and within target range for patient.. Vitals Time Taken: 12:56 PM, Height: 70 in, Source: Stated, Weight: 250 lbs, Source: Stated, BMI: 35.9, Temperature: 97.7 F, Pulse: 82 bpm, Respiratory Rate: 20 breaths/min, Blood Pressure: 165/82 mmHg. Cardiovascular 2+ dorsalis pedis/posterior tibialis pulses. Psychiatric pleasant and cooperative. General Notes: Bilateral lower extremity wounds with granulation tissue and scant nonviable tissue. 2+ pitting edema to the knee. Venous stasis dermatitis. No signs of surrounding infection including increased warmth, erythema or purulent drainage. Integumentary (Hair, Skin) Wound #11 status is Open. Original cause of wound was Blister. The date acquired was: 02/16/2023. The wound is located on the Left,Anterior Lower Leg. The wound measures 3.5cm  length x 1cm width x 0.1cm depth; 2.749cm^2 area and 0.275cm^3 volume. There is Fat Layer (Subcutaneous Tissue) exposed. There is no tunneling or undermining noted. There is a medium amount of serosanguineous drainage noted. There is large (67-100%) red, pink granulation within the wound bed. There is a small (1-33%) amount of necrotic tissue within the wound bed including Adherent Slough. Wound #12 status is Open. Original cause of wound was Blister. The date acquired was: 02/16/2023. The wound is located on the Right,Proximal,Anterior Lower Leg. The wound measures 1.8cm length x 1cm width x 0.1cm depth; 1.414cm^2 area and 0.141cm^3 volume. There is Fat Layer (Subcutaneous Tissue) exposed. There is no tunneling or undermining noted. There is a medium amount of serosanguineous drainage noted. There is medium (34-66%) red, pink granulation within the wound bed. There is a medium (34-66%) amount of necrotic tissue within the wound bed including Adherent Slough. Wound #13 status is Open. Original cause of wound was Blister. The date acquired was: 02/16/2023. The wound is located on the Right,Distal,Anterior Lower Leg. The wound measures 1cm length x 0.8cm width x 0.1cm depth; 0.628cm^2 area and 0.063cm^3 volume. There is Fat Layer (Subcutaneous Tissue) exposed. There is no tunneling or undermining noted. There is a medium amount of serosanguineous drainage noted. There is small (1-33%) red, pink granulation within the wound bed. There is a large (67-100%) amount of necrotic tissue within the wound bed including Adherent Slough. 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 ABIs are normal and patient should have adequate blood flow for healing. At this time I recommended antibiotic ointment with silver alginate under 3 layer compression. No signs of infection. He knows to not get the wraps wet or keep this on for more than 7 days. ZECHARIAH, BISSONNETTE ([id number]) [id number] Page 9 of 12 Procedures Wound #11 Pre-procedure diagnosis of Wound #11 is a Venous Leg Ulcer located on the Left,Anterior Lower Leg . There was a Three Layer Compression Therapy Procedure by Redmond Pulling, RN. Post procedure Diagnosis Wound #11: Same as Pre-Procedure Wound #12 Pre-procedure diagnosis of Wound #12 is a Venous Leg Ulcer located on the Right,Proximal,Anterior Lower Leg . There was a Three Layer Compression Therapy Procedure by Redmond Pulling, RN. Post procedure Diagnosis Wound #12: Same as Pre-Procedure Wound #13 Pre-procedure diagnosis of  Wound #13 is a Venous Leg Ulcer located on the Right,Distal,Anterior Lower Leg . There was a Three Layer Compression Therapy Procedure by Redmond Pulling, RN. Post procedure Diagnosis Wound #13: Same as Pre-Procedure Plan Follow-up Appointments: Return Appointment in 1 week. - Dr Mikey Bussing - Room 7 Anesthetic: Wound #11 Left,Anterior Lower Leg: (In clinic) Topical Lidocaine 4% applied to wound bed Wound #12 Right,Proximal,Anterior Lower Leg: (In clinic) Topical Lidocaine 4% applied to wound bed Wound #13 Right,Distal,Anterior Lower Leg: (In clinic) Topical Lidocaine 4% applied to wound bed Bathing/ Shower/ Hygiene: May shower with protection but do not get wound dressing(s) wet. Protect dressing(s) with water repellant cover (for example, large plastic bag) or a cast cover and may then take shower. Edema Control - Lymphedema / SCD / Other: Elevate legs to the level of the heart or above for 30 minutes daily and/or when sitting for 3-4 times a  day throughout the day. Avoid standing for long periods of time. The following medication(s) was prescribed: lidocaine topical 4 % cream cream topical once daily for prior to debridement was prescribed at facility WOUND #11: - Lower Leg Wound Laterality: Left, Anterior Cleanser: Soap and Water 1 x Per Week/30 Days Discharge Instructions: May shower and wash wound with dial antibacterial soap and water prior to dressing change. Cleanser: Vashe 5.8 (oz) 1 x Per Week/30 Days Discharge Instructions: Cleanse the wound with Vashe prior to applying a clean dressing using gauze sponges, not tissue or cotton balls. Peri-Wound Care: Sween Lotion (Moisturizing lotion) 1 x Per Week/30 Days Discharge Instructions: Apply moisturizing lotion as directed Topical: Gentamicin 1 x Per Week/30 Days Discharge Instructions: As directed by physician Topical: Mupirocin Ointment 1 x Per Week/30 Days Discharge Instructions: Apply Mupirocin (Bactroban) as instructed Prim Dressing: Maxorb Extra Ag+ Alginate Dressing, 2x2 (in/in) 1 x Per Week/30 Days ary Discharge Instructions: Apply to wound bed as instructed Secondary Dressing: ABD Pad, 8x10 1 x Per Week/30 Days Discharge Instructions: Apply over primary dressing as directed. Com pression Wrap: Urgo K2 Lite, two layer compression system, regular 1 x Per Week/30 Days Discharge Instructions: Apply Urgo K2 Lite as directed (alternative to 3 layer compression). WOUND #12: - Lower Leg Wound Laterality: Right, Anterior, Proximal Cleanser: Soap and Water 1 x Per Week/30 Days Discharge Instructions: May shower and wash wound with dial antibacterial soap and water prior to dressing change. Cleanser: Vashe 5.8 (oz) 1 x Per Week/30 Days Discharge Instructions: Cleanse the wound with Vashe prior to applying a clean dressing using gauze sponges, not tissue or cotton balls. Peri-Wound Care: Sween Lotion (Moisturizing lotion) 1 x Per Week/30 Days Discharge Instructions: Apply  moisturizing lotion as directed Topical: Gentamicin 1 x Per Week/30 Days Discharge Instructions: As directed by physician Topical: Mupirocin Ointment 1 x Per Week/30 Days Discharge Instructions: Apply Mupirocin (Bactroban) as instructed Prim Dressing: Maxorb Extra Ag+ Alginate Dressing, 2x2 (in/in) 1 x Per Week/30 Days ary Discharge Instructions: Apply to wound bed as instructed Secondary Dressing: ABD Pad, 8x10 1 x Per Week/30 Days Discharge Instructions: Apply over primary dressing as directed. Com pression Wrap: Urgo K2 Lite, two layer compression system, regular 1 x Per Week/30 Days Discharge Instructions: Apply Urgo K2 Lite as directed (alternative to 3 layer compression). WOUND #13: - Lower Leg Wound Laterality: Right, Anterior, Distal Cleanser: Soap and Water 1 x Per Week/30 Days Discharge Instructions: May shower and wash wound with dial antibacterial soap and water prior to dressing change. Cleanser: Vashe 5.8 (oz) 1 x Per Week/30 Days Discharge Instructions: Cleanse  the wound with Vashe prior to applying a clean dressing using gauze sponges, not tissue or cotton balls. Peri-Wound Care: Sween Lotion (Moisturizing lotion) 1 x Per Week/30 Days Discharge Instructions: Apply moisturizing lotion as directed Topical: Gentamicin 1 x Per Week/30 Days Discharge Instructions: As directed by physician Topical: Mupirocin Ointment 1 x Per Week/30 Days Discharge Instructions: Apply Mupirocin (Bactroban) as instructed Prim Dressing: Maxorb Extra Ag+ Alginate Dressing, 2x2 (in/in) 1 x Per Week/30 Days ary FITZGERALD, DUNNE ([id number]) [id number] Page 10 of 12 Discharge Instructions: Apply to wound bed as instructed Secondary Dressing: ABD Pad, 8x10 1 x Per Week/30 Days Discharge Instructions: Apply over primary dressing as directed. Compression Wrap: Urgo K2 Lite, two layer compression system, regular 1 x Per Week/30 Days Discharge Instructions: Apply Urgo K2 Lite  as directed (alternative to 3 layer compression). 1. Antibiotic ointment with silver alginate under 3 layer compression to lower extremities bilaterally 2. Follow-up in 1 week Electronic Signature(s) Signed: 03/02/2023 3:35:10 PM By: Geralyn Corwin DO Entered By: Geralyn Corwin on 03/02/2023 14:08:21 -------------------------------------------------------------------------------- HxROS Details Patient Name: [name]: [name] [name] 03/02/2023 12:30 PM Medical Record Number: [id number] Patient Account Number: [id number] Date of Birth/Sex: Treating RN: [birth year] (82 y.o. Tammy Sours Primary Care Provider: [name] Other Clinician: Referring Provider: Treating Provider/Extender: [name] Weeks in Treatment: 0 Information Obtained From Patient Eyes Complaints and Symptoms: Negative for: Dry Eyes; Vision Changes; Glasses / Contacts Medical History: Positive for: Cataracts - 2019 Negative for: Glaucoma; Optic Neuritis Ear/Nose/Mouth/Throat Complaints and Symptoms: Negative for: Chronic sinus problems or rhinitis Medical History: Positive for: Chronic sinus problems/congestion Negative for: Middle ear problems Respiratory Complaints and Symptoms: Positive for: Shortness of Breath Negative for: Chronic or frequent coughs Medical History: Positive for: Asthma; Sleep Apnea Negative for: Aspiration; Chronic Obstructive Pulmonary Disease (COPD); Pneumothorax; Tuberculosis Gastrointestinal Complaints and Symptoms: Negative for: Frequent diarrhea; Nausea; Vomiting Medical History: Negative for: Cirrhosis ; Colitis; Crohns; Hepatitis A; Hepatitis B; Hepatitis C Past Medical History Notes: GERD Genitourinary Complaints and Symptoms: Negative for: Frequent urination Medical History: Negative for: End Stage Renal Disease Past Medical History Notes: BPH Integumentary (Skin) THANG, FLETT ([id number])  [id number] Page 11 of 12 Complaints and Symptoms: Positive for: Wounds Review of System Notes: BL Lower leg Medical History: Negative for: History of Burn Musculoskeletal Complaints and Symptoms: Positive for: Muscle Weakness Medical History: Positive for: Osteoarthritis Negative for: Gout; Rheumatoid Arthritis; Osteomyelitis Past Medical History Notes: Degenerative disc disease Psychiatric Complaints and Symptoms: Negative for: Claustrophobia Medical History: Negative for: Anorexia/bulimia; Confinement Anxiety Hematologic/Lymphatic Medical History: Negative for: Anemia; Hemophilia; Human Immunodeficiency Virus; Lymphedema; Sickle Cell Disease Cardiovascular Medical History: Positive for: Hypertension; Peripheral Venous Disease Negative for: Angina; Arrhythmia; Congestive Heart Failure; Coronary Artery Disease; Deep Vein Thrombosis; Hypotension; Myocardial Infarction; Peripheral Arterial Disease; Phlebitis; Vasculitis Endocrine Medical History: Negative for: Type I Diabetes; Type II Diabetes Immunological Medical History: Negative for: Lupus Erythematosus; Raynauds; Scleroderma Neurologic Medical History: Positive for: Neuropathy Negative for: Dementia; Quadriplegia; Paraplegia; Seizure Disorder Past Medical History Notes: Neuromuscular disorder Oncologic Medical History: Negative for: Received Chemotherapy; Received Radiation HBO Extended History Items Ear/Nose/Mouth/Throat: Eyes: Chronic sinus Cataracts problems/congestion Immunizations Pneumococcal Vaccine: Received Pneumococcal Vaccination: Yes Received Pneumococcal Vaccination On or After 60th Birthday: No Implantable Devices Yes Hospitalization / Surgery History Type of Hospitalization/Surgery 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 GAREK, SCHUNEMAN ([id number]) [id number] Page 12 of 12 left knee replacement RSV jan  2024 Family and Social History Cancer: No; Diabetes:  Yes - Maternal Grandparents; Heart Disease: Yes - Father; Hereditary Spherocytosis: No; Hypertension: Yes - Father; Kidney Disease: No; Lung Disease: No; Seizures: No; Stroke: Yes - Father; Thyroid Problems: Yes - Mother,Child; Tuberculosis: No; Never smoker; Marital Status - Single; Alcohol Use: Rarely; Drug Use: No History; Caffeine Use: Daily - coffee; Financial Concerns: No; Food, Clothing or Shelter Needs: No; Support System Lacking: No; Transportation Concerns: No Electronic Signature(s) Signed: 03/02/2023 3:35:10 PM By: Geralyn Corwin DO Signed: 03/02/2023 4:08:22 PM By: Thayer Dallas Signed: 03/02/2023 4:27:44 PM By: Shawn Stall RN, BSN Previous Signature: 03/02/2023 9:25:08 AM Version By: Geralyn Corwin DO Entered By: Thayer Dallas on 03/02/2023 13:15:15 -------------------------------------------------------------------------------- SuperBill Details Patient Name: [name]: [name] [name] 03/02/2023 Medical Record Number: [id number] Patient Account Number: [id number] Date of Birth/Sex: Treating RN: 29-Jun-1941 (82 y.o. M) Primary Care Provider: [name] Other Clinician: Referring Provider: Treating Provider/Extender: [name] Weeks in Treatment: 0 Diagnosis Coding ICD-10 Codes Code Description [phone] Non-pressure chronic ulcer of other part of right lower leg with fat layer exposed L97.822 Non-pressure chronic ulcer of other part of left lower leg with fat layer exposed I87.313 Chronic venous hypertension (idiopathic) with ulcer of bilateral lower extremity N18.30 Chronic kidney disease, stage 3 unspecified Facility Procedures : CPT4: Code [id number] Description: 81 BILATERAL: Application of multi-layer venous compression system; leg (below knee), including ankle and t. 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

## 2023-03-08 NOTE — Progress Notes (Deleted)
Cardiology Office Note:    Date:  03/08/2023   ID:  [name], DOB June 06, 1941, MRN [id number]  PCP:  Gweneth Dimitri, MD  Cardiologist:  Nanetta Batty, MD  Electrophysiologist:  None   Referring MD: Gweneth Dimitri, MD   Chief Complaint: follow-up of chronic dyspnea and lower extremity edema   History of Present Illness:    [name] is a 82 y.o. male with a history of normal coronary arteries on remote cardiac catheterization in 2008 but multivessel coronary calcifications noted on chest CT in 01/2023, chronic dyspnea, chronic lower extremity venous insufficiency s/p endovenous ablation of right saphenous vein in 05/2014, s/p right great toe amputation in 04/2022 secondary to cellulitis, hypertension, hyperlipidemia, pre-diabetes, sleep apnea on CPAP, GERD, and anxiety/depression who is followed by Dr. Allyson Sabal and presents today for routine follow-up.   Patient has a history of chest pain and underwent cardiac catheterization 2008 which showed normal coronaries. He underwent repeat Myoview in 02/2017 due to increasing dyspnea on exertion and this showed no evidence of ischemia. Last Echo is 07/2019 showed LVEF of 60-65% with grade 1 diastolic dysfunction and mild AI.   Patient has chronic lower extremity edema due to venous insuffiency and underwent endovenous ablation of right saphenous vein by Dr. Tawanna Cooler in 2015. He was seen by Dr. Chestine Spore in 02/2020 for further evaluation of cold right lower extremity with toe ulcer. Vascular ultrasound with ABIs were ordered and showed normal ABIs and TBIs bilaterally. He ultimately underwent peripheral angiogram in 03/2020 which showed no significant aortoiliac disease or significant outflow disease with three-vessel runoff bilaterally. Last ABIs in 10/2022 were normal bilaterally.  Patient was admitted in 11/2022 for acute hypoxic respiratory failure secondary to influenza A pneumonia, RSV, and asthma exacerbation. He was treated with Tamiflu, antibiotics,  and steroids. He was discharged with home O2. He was last seen by Dr. Allyson Sabal in 12/2022 at which time he continued to have chronic dyspnea but no chest pain. He also reported some lower extremity edema. Lasix was increased to twice daily for 1 week and then patient was instructed to return to once daily dosing. Repeat Echo was also ordered which showed LVEF of 60-65% with normal wall motion, mild LVH, and indeterminate diastolic parameters. RV was normal and there was no evidence of significant valvular disease.   Patient presents today for follow-up. ***  Chronic Dyspnea Patient has chronic dyspnea. Cardiac work-up in the past has been unremarkable. Myoview in 2018 showed no ischemia. Last Echo in 12/2022 showed normal LV function, normal RV, and no evidence of significant valvular disease. Previously felt to be multifactorial due to reactive airway disease, obesity, deconditioning, and possibly mild diastolic dysfunction (grade 1 diastolic dysfunction noted on Echo in 2020 but indeterminate diastolic parameters on most recent Echo in 12/2022). He has been sen by Pulmonology who ordered high resolution CT in 01/2023 which showed no significant lung disease but severe tracheobronchomalacia as well as three vessel coronary artery calcifications. *** - Stable. *** - Given no significant lung disease on CT and persistent symptoms as well as three vessel coronary artery calcifications, will order coronary CTA to rule out anginal equivalent. ***  Coronary Artery Calcifications Recent high-resolution chest CT showed three vessel coronary artery calcifications..  - No chest pain but chronic dyspnea as above. - Recommend starting Aspirin 81mg  daily.  - Continue statin.  - Coronary CTA as above ***  Chronic Venous Insufficiency Patient has chronic lower extremity edema due to venous reflux s/p ablation  of right saphenous vein in 2015.  - Stable. *** - Continue Lasix 40mg  daily.  Hypertension History of  hypertension but not on any antihypertensives other than Lasix.  - BP well controlled. ***  Hyperlipidemia - Lipid panel from ***: *** - Continue Crestor 5mg  daily.  Obstructive Sleep Apnea - Compliant with CPAP. Continue. 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   Hyperlipidemia    Joint pain    Neuromuscular disorder (HCC)    Pre-diabetes    Prediabetes    Sleep apnea    uses CPAP nightly   SOB (shortness of breath)    Swallowing difficulty    Swelling of lower extremity    right more than leg leg   Umbilical hernia     Past Surgical History:  Procedure Laterality Date   ABDOMINAL AORTOGRAM W/LOWER EXTREMITY N/A 03/14/2020   Procedure: ABDOMINAL AORTOGRAM W/LOWER EXTREMITY;  Surgeon: Nada Libman, MD;  Location: [facility];  Service: Vascular;  Laterality: N/A;   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   AMPUTATION TOE Right 04/18/2022   Procedure: AMPUTATION RIGHT GREAT TOE;  Surgeon: Jodi Geralds, MD;  Location: [facility];  Service: Orthopedics;  Laterality: Right;   BACK SURGERY  2003   L4, L5   BACK SURGERY     CATARACT EXTRACTION, BILATERAL     ENDOVENOUS ABLATION SAPHENOUS VEIN W/ LASER Right 06-01-2014   EVLA right greater saphenous vein by Gretta Began MD    epidural steroid injections        [facility]   hernia repair  2003   HERNIA REPAIR     hernia inguinal x 2    TOTAL HIP ARTHROPLASTY Left 05/05/2022   Procedure: LEFT TOTAL HIP ARTHROPLASTY ANTERIOR APPROACH;  Surgeon: Jodi Geralds, MD;  Location: [facility];  Service: Orthopedics;  Laterality: Left;   TOTAL KNEE ARTHROPLASTY Left 03/22/2015   Procedure: TOTAL KNEE ARTHROPLASTY;  Surgeon: Cammy Copa, MD;  Location: [facility];  Service: Orthopedics;  Laterality: Left;   UMBILICAL HERNIA REPAIR N/A 06/27/2016   Procedure: LAPAROSCOPIC ASSISTED REPAIR OF UMBILICAL HERINA WITH MESH;  Surgeon: Luretha Murphy, MD;  Location: [facility];  Service: General;  Laterality: N/A;   VASCULAR SURGERY  2015   right leg    Current Medications: No outpatient medications have been marked as taking for the 03/18/23 encounter (Appointment) with Corrin Parker, PA-C.     Allergies:   Patient has no known allergies.   Social History   Socioeconomic History   Marital status: Single    Spouse name: Not on file   Number of children: Not on file   Years of education: Not on file   Highest education  level: Not on file  Occupational History   Occupation: Antiques/Real Environmental manager: RETIRED  Tobacco Use   Smoking status: Never   Smokeless tobacco: Never  Vaping Use   Vaping Use: Never used  Substance and Sexual Activity   Alcohol use: Yes    Comment: rare   Drug use: No   Sexual activity: Yes    Birth control/protection: Condom  Other Topics Concern   Not on file  Social History Narrative   ** Merged History Encounter **       Single. Education: Other.    Social Determinants of Health   Financial Resource Strain: Not on file  Food Insecurity: No Food Insecurity (11/14/2022)   Hunger Vital Sign    Worried About Running Out of Food in the Last Year: Never true    Ran Out of Food in the Last Year: Never true  Transportation Needs: No Transportation Needs (11/14/2022)   PRAPARE - Administrator, Civil Service (Medical): No    Lack of Transportation (Non-Medical): No  Physical Activity: Not on  file  Stress: Not on file  Social Connections: Not on file     Family History: The patient's family history includes Diabetes in his maternal grandfather; Heart disease in his father; Hypertension in his father; Thyroid disease in his mother. There is no history of Colon cancer or Esophageal cancer.  ROS:   Please see the history of present illness.     EKGs/Labs/Other Studies Reviewed:    The following studies were reviewed:  Myoview 02/19/2017: Nuclear stress EF: 45%. Mild diffuse hypokinesis The left ventricular ejection fraction is mildly decreased (45-54%). Note, echocardiogram demonstrates normal ejection fraction. There was no ST segment deviation noted during stress. This is a low risk study. No ischemia identified.  _______________  ABIs/ TBIs 10/17/2022: Summary: Right: Resting right ankle-brachial index is within normal range.   Left: Resting left ankle-brachial index is within normal range. The left  toe-brachial index is abnormal.   _______________  Echocardiogram 01/12/2023: Impressions: 1. Left ventricular ejection fraction, by estimation, is 60 to 65%. Left  ventricular ejection fraction by 3D volume is 60 %. The left ventricle has  normal function. The left ventricle has no regional wall motion  abnormalities. There is mild left  ventricular hypertrophy. Left ventricular diastolic parameters are  indeterminate.   2. Right ventricular systolic function is normal. The right ventricular  size is normal. There is normal pulmonary artery systolic pressure. The  estimated right ventricular systolic pressure is 30.1 mmHg.   3. The mitral valve is normal in structure. Trivial mitral valve  regurgitation.   4. The aortic valve is tricuspid. Aortic valve regurgitation is mild. No  aortic stenosis is present.   5. Aortic dilatation noted. There is mild dilatation of the ascending  aorta, measuring 38 mm.   6. The inferior vena cava is dilated in size with >50%  respiratory  variability, suggesting right atrial pressure of 8 mmHg.    EKG:  EKG not ordered today.   Recent Labs: 11/12/2022: B Natriuretic Peptide 52.8 11/18/2022: Magnesium 2.1 02/23/2023: ALT 31; BUN 12; Creatinine, Ser 1.88; Hemoglobin 13.4; Platelets 221; Potassium 3.2; Sodium 138  Recent Lipid Panel    Component Value Date/Time   CHOL 175 03/11/2016 1340   TRIG 199 (H) 03/11/2016 1340   HDL 38 (L) 03/11/2016 1340   CHOLHDL 4.6 03/11/2016 1340   VLDL 40 (H) 03/11/2016 1340   LDLCALC 97 03/11/2016 1340  Physical Exam:    Vital Signs: There were no vitals taken for this visit.    Wt Readings from Last 3 Encounters:  01/02/23 277 lb (125.6 kg)  12/17/22 279 lb (126.6 kg)  11/22/22 131 lb 6.3 oz (59.6 kg)     General: 82 y.o. male in no acute distress. HEENT: Normocephalic and atraumatic. Sclera clear. EOMs intact. Neck: Supple. No carotid bruits. No JVD. Heart: *** RRR. Distinct S1 and S2. No murmurs, gallops, or rubs. Radial and distal pedal pulses 2+ and equal bilaterally. Lungs: No increased work of breathing. Clear to ausculation bilaterally. No wheezes, rhonchi, or rales.  Abdomen: Soft, non-distended, and non-tender to palpation. Bowel sounds present in all 4 quadrants.  MSK: Normal strength and tone for age. *** Extremities: No lower extremity edema.    Skin: Warm and dry. Neuro: Alert and oriented x3. No focal deficits. Psych: Normal affect. Responds appropriately.   Assessment:    No diagnosis found.  Plan:     Disposition: Follow up in ***   Medication Adjustments/Labs and Tests Ordered: Current medicines are reviewed at length with the patient today.  Concerns regarding medicines are outlined above.  No orders of the defined types were placed in this encounter.  No orders of the defined types were placed in this encounter.   There are no Patient Instructions on file for this visit.   Signed, Corrin Parker, PA-C  03/08/2023 5:53 PM     [facility]

## 2023-03-09 ENCOUNTER — Ambulatory Visit (HOSPITAL_BASED_OUTPATIENT_CLINIC_OR_DEPARTMENT_OTHER): Payer: Medicare Other | Admitting: Internal Medicine

## 2023-03-09 ENCOUNTER — Encounter (HOSPITAL_BASED_OUTPATIENT_CLINIC_OR_DEPARTMENT_OTHER): Payer: Medicare Other | Admitting: Internal Medicine

## 2023-03-09 DIAGNOSIS — I87313 Chronic venous hypertension (idiopathic) with ulcer of bilateral lower extremity: Secondary | ICD-10-CM

## 2023-03-09 DIAGNOSIS — N183 Chronic kidney disease, stage 3 unspecified: Secondary | ICD-10-CM | POA: Diagnosis not present

## 2023-03-09 DIAGNOSIS — Z89411 Acquired absence of right great toe: Secondary | ICD-10-CM | POA: Diagnosis not present

## 2023-03-09 DIAGNOSIS — L97822 Non-pressure chronic ulcer of other part of left lower leg with fat layer exposed: Secondary | ICD-10-CM | POA: Diagnosis not present

## 2023-03-09 DIAGNOSIS — L97812 Non-pressure chronic ulcer of other part of right lower leg with fat layer exposed: Secondary | ICD-10-CM

## 2023-03-09 DIAGNOSIS — I129 Hypertensive chronic kidney disease with stage 1 through stage 4 chronic kidney disease, or unspecified chronic kidney disease: Secondary | ICD-10-CM | POA: Diagnosis not present

## 2023-03-09 DIAGNOSIS — Z23 Encounter for immunization: Secondary | ICD-10-CM | POA: Diagnosis not present

## 2023-03-10 NOTE — Progress Notes (Signed)
[name], [name] ([id number]) [id number] Page 1 of 11 Visit Report for 03/09/2023 Chief Complaint Document Details Patient Name: [name]: RETT, STEHLIK [name] 03/09/2023 1:30 PM Medical Record Number: [id number] Patient Account Number: [id number] Date of Birth/Sex: Treating RN: 11/11/1940 (82 y.o. 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 03/09/2023 1:30 PM Medical Record Number: [id number] Patient Account Number: [id number] Date of Birth/Sex: Treating RN: [birth year] (82 y.o. Charlean Merl, [name] Primary Care Provider: [name] Other Clinician: Referring Provider: Treating Provider/Extender: [name] Weeks in Treatment: 1 Debridement Performed for Assessment: Wound #13 Right,Distal,Anterior Lower Leg Performed By: Physician Geralyn Corwin, DO Debridement Type: Debridement Severity of Tissue Pre Debridement: Fat layer exposed Level of Consciousness (Pre-procedure): Awake and Alert Pre-procedure Verification/Time Out Yes - 13:52 Taken: Start Time: 13:52 Pain Control: Lidocaine Percent of Wound Bed  Debrided: 100% T Area Debrided (cm): otal 0.12 Tissue and other material debrided: Viable, Non-Viable, Slough, Skin: Dermis , Skin: Epidermis, Slough Level: Skin/Epidermis Debridement Description: Selective/Open Wound Instrument: Curette Bleeding: Minimum Hemostasis Achieved: Pressure End Time: 13:52 Procedural Pain: 0 Post Procedural Pain: 0 Response to Treatment: Procedure was tolerated well Level of Consciousness (Post- Awake and Alert procedure): Post Debridement Measurements of Total Wound Length: (cm) 0.5 Width: (cm) 0.3 Depth: (cm) 0.1 Volume: (cm) 0.012 Character of Wound/Ulcer Post Debridement: Improved Severity of Tissue Post Debridement: Fat layer exposed Post Procedure Diagnosis Same as Pre-procedure SHUAIB, CORSINO ([id number]) [id number] Page 2 of 11 Electronic Signature(s) Signed: 03/09/2023 3:16:56 PM By: Fonnie Mu RN Signed: 03/09/2023 3:46:02 PM By: Geralyn Corwin DO Entered By: Fonnie Mu on 03/09/2023 13:53:11 -------------------------------------------------------------------------------- HPI Details Patient Name: [name]: [name] [name] 03/09/2023 1:30 PM Medical Record Number: [id number] Patient Account Number: [id number] Date of Birth/Sex: Treating RN: 09/20/1941 (82 y.o. M) Primary Care Provider: [name] Other Clinician: Referring Provider: Treating Provider/Extender: [name] Weeks in Treatment: 1 History of Present Illness HPI Description: ADMISSION 02/07/2021 This is a 82 year old man who is not a diabetic "prediabetic" who comes in today letter asking [place] to look at an area on his right first toe tip that has been there about a year. He has been getting this dressed that [employer] and he had Xeroform on this with gauze. The patient is not exactly sure how this came about but he is not able to dress the wound himself because of limitations due to what sounds  like spinal stenosis and pain. He had a wound on the ankle as well but that healed over. He was seen by vascular in May 2021. This was related to a right great toe wound. He had an angiogram that was completely normal on both sides good perfusion right into the feet. He also looks like he has lymphedema and he has compression stockings but he cannot get them on and  off so he essentially leaves he is on even when he showering Past medical history includes prediabetes, low back pain secondary I think the lumbar spinal stenosis has been offered a decompressive laminectomy but he has not gone forth with this. 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 ABI was noncompressible in our clinic on the right right first toe. 4/7; patient I admitted the clinic last week. He has an area on the tip of his right first toe. We cleaned this off last week and have been using silver collagen. He is in the same crocs today that he wore last week and I told him not to use. For 1 reason or another he could not handle a surgical sandal. 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 He is not a diabetic 4/12; right first toe wound chronic. We have been using silver collagen. Once again he comes in then the same pair of old crocs that he comes in every week. 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 His x-ray suggested suspicious for osteomyelitis with bone irregularity at the tuft of the right great toe 4/21; patient has been using silver collagen every other day to the wound on his right great toe. He continues to wear crocs. He has no complaints or issues today. 5/5; 2-week follow-up. 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 Mild soft tissue edema with enhancement at the distal aspect of the great toe suggestive of cellulitis.  Fortunately the wound actually looks better. Has been using Hydrofera Blue 5/12; started him on Augmentin last week 875 twice daily he is tolerating this well. Last creatinine I see is 1.77 in early April. We are using Hydrofera Blue to the wound which actually looks better today. 5/17; tolerating the Augmentin well. He says the wound is actually been hurting him episodically. Actually the surface of this looks quite good. He did not get his lab work done. We are using [facility] he is changing this himself 6/2; still taking Augmentin he should be rounding into his last week. This was empirically for underlying osteomyelitis. The area on the tip of his toe is still open still with some depth but no palpable bone. We have been using Hydrofera Blue. Surface area of the wound has not been making much improvement Is work was essentially unremarkable except for a creatinine of 1.79. Reason for his renal insufficiency is not clear he is not a diabetic does not have hypertension however he does take NSAIDs and Lasix I wonder if this is the issue here. Note that his creatinine on 11/12/2018 was 1.45. Platelet count slightly low at 143 however his inflammatory markers were really in the normal range at 10 and 2 sedimentation rate and C-reactive protein respectively. 6/9; he apparently had another 2 weeks of Augmentin after we look that this last time. Therefore he should be coming into his last week next week. We are using polymen on the tip of the toe. He cannot get down to do the dressing so he comes in for a nurse visit. He comes in in crocs I have warned him against this although he says he is using a different shoe for most of the day he works in [employer]. 6/16; he comes back in in crocs. I have spoken him  about this before. His areas on the tip of his toe this actually looks quite good we have been using polymen. He has completed his antibiotics which we empirically gave him for underlying  osteomyelitis. Everything looks better to me in spite of his noncompliance with footwear recommendation 7/11; patient has been followed through nurse visits. He was last seen by provider on 6/16. He reports improvement to the wound with the use of PolyMem. He denies signs of infection. 7/21; the patient arrives with the area on the tip of his right great toe completely healed. He has the same proximal on that I see every time. I treated him empirically with Augmentin for osteomyelitis. He needs to be vigilant about this. READMISSION 03/19/2022 The patient returns with a reopening of the right great toe wound. Apparently this occurred in around October of last year. He was followed by podiatry for some time. They have performed x-rays but unfortunately, we do not have access to them or the results. No report as to whether or not he might have osteomyelitis. In addition, he has a new wound on the medial aspect of the right great toe as well as the anterior tibial surface of his right lower leg. He does state that he was wearing compression stockings when this tibial wound occurred. He continues to wear crocs. 04/02/2022: For some reason, the patient has not been seen in 2 weeks. He says that he had to reschedule his visit. Therefore his Coflex compression wrap was left in situ for 14 days. It appears that his skin became macerated and he has opened up several new superficial wounds on his right anterior tibial surface. We did get an x-ray to evaluate for osteomyelitis and none was seen. All of the pre-existing wounds are smaller today and the new wounds look like they are limited to just the breakdown of skin. 04/10/2022: The wounds on his right anterior tibial surface are actually a little bit larger today. He seems to have some skin irritation and maceration in the area again. The tip of his toe is nearly closed as is the wound on the dorsal surface of his right great toe. READMISSION 07/10/2022 Since  our last visit, the patient went to the operating room to have a left hip replacement, but in the preop holding area, his surgeon noted a foul odor coming RIGLEY, NIESS ([id number]) [id number] Page 3 of 11 from his right foot. He contacted me and described the findings of his right great toe and we mutually agreed, along with the patient, that amputation would be most appropriate. 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 Since his surgery, he has been wearing compression stockings bilaterally, but as he is unable to apply or remove them himself, he wears them for a week at a time. This appears to have resulted in a pressure injury on his right anterior ankle at the point of flexion; he has a similar looking abrasion that has not broken the skin in the same site on the left. 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 07/21/2022: The right anterior ankle wound is nearly closed aside from a small open area that has some slough accumulation. The amputation site has crusting and eschar present. Once this was removed, it was revealed that the amputation site has healed. The abrasion on the left anterior ankle at the point of flexion is no longer present. 07/28/2022: The  patient dropped a door on his leg and has a new wound to his right anterior tibial surface. There is some nonviable skin hanging from the cranial portion of the wound. The fat layer is exposed. The right anterior ankle wound is a little bit smaller and has some accumulated slough present. 08/04/2022: The wound on his right anterior tibial surface is a little bit smaller today. There is still slough accumulation. The right anterior ankle wound is quite a bit smaller today and also has slough buildup on the surface. 08/13/2022: The patient dropped another object that struck him in the left anterior tibial surface. This has  resulted in wound at this location. It is fairly superficial but does involve the fat layer. The right anterior tibial surface wound has a little bit of eschar on the surface and underneath, it is quite small. The right anterior ankle wound has also contracted but does have slough and eschar buildup as well. 08/21/2022: The right anterior tibial wound has healed. The left anterior tibial wound is quite a bit smaller and more superficial with just a little eschar on the surface. The right anterior ankle wound also continues to contract but continues to accumulate slough and eschar, as well. 08/27/2022: The left anterior tibial wound is closed. The right anterior ankle wound is considerably smaller with just a little eschar and slough buildup. 09/03/2022: His wounds are all healed. 03/02/2023 Mr. Alejandro Gamel is an 82 year old male with a past medical history of venous insufficiency that presents to clinic for a 1 month history of nonhealing wounds to his lower extremities bilaterally. He states that these occurred spontaneously. He was seen by vein and vascular who placed an Unna boot on the right and a compression stocking on the left. He has had this on for the past week. Prior to this he has compression stockings on that he leaves in place for weeks at a time. It is hard for him to take these on and off. He currently denies signs of infection. Unclear if the dressing has been used to the wound beds. 4/29; patient presents for follow-up. We have been using silver alginate with antibiotic ointment under 3 layer compression to the lower extremities bilaterally. The wounds are smaller today. Electronic Signature(s) Signed: 03/09/2023 3:46:02 PM By: Geralyn Corwin DO Entered By: Geralyn Corwin on 03/09/2023 15:07:24 -------------------------------------------------------------------------------- Physical Exam Details Patient Name: [name]: [name] [name] 03/09/2023 1:30 PM Medical  Record Number: [id number] Patient Account Number: [id number] Date of Birth/Sex: Treating RN: 1941-04-12 (82 y.o. M) Primary Care Provider: [name] Other Clinician: Referring Provider: Treating Provider/Extender: [name] Weeks in Treatment: 1 Constitutional respirations regular, non-labored and within target range for patient.. Cardiovascular 2+ dorsalis pedis/posterior tibialis pulses. Psychiatric pleasant and cooperative. Notes Bilateral lower extremity wounds with granulation tissue to all wound beds except for the anterior right leg wound. This has slough. No signs of surrounding infection. Good edema control. Electronic Signature(s) Signed: 03/09/2023 3:46:02 PM By: Geralyn Corwin DO Entered By: Geralyn Corwin on 03/09/2023 15:08:12 -------------------------------------------------------------------------------- Physician Orders Details Patient Name: [name]: [name] [name] 03/09/2023 1:30 PM Medical Record Number: [id number] Patient Account Number: [id number] Date of Birth/Sex: Treating RN: Oct 11, 1941 (82 y.o. Charlean Merl, [name] Primary Care Provider: [name] Other Clinician: Referring Provider: Treating Provider/Extender: [name] Weeks in Treatment: 1 ANIRUDH, BAIZ ([id number]) [id number] Page 4 of 11 Verbal / Phone Orders: No Diagnosis Coding Follow-up Appointments ppointment in 1  week. - w/ Dr. Mikey Bussing and Maryruth Bun RM # 9 Monday 03/16/23 @ 1:15 Return A Anesthetic Wound #11 Left,Anterior Lower Leg (In clinic) Topical Lidocaine 4% applied to wound bed Wound #13 Right,Distal,Anterior Lower Leg (In clinic) Topical Lidocaine 4% applied to wound bed Bathing/ Shower/ Hygiene May shower with protection but do not get wound dressing(s) wet. Protect dressing(s) with water repellant cover (for example, large plastic bag) or a cast cover and may then take  shower. Edema Control - Lymphedema / SCD / Other Bilateral Lower Extremities Elevate legs to the level of the heart or above for 30 minutes daily and/or when sitting for 3-4 times a day throughout the day. Avoid standing for long periods of time. Wound Treatment Wound #11 - Lower Leg Wound Laterality: Left, Anterior Cleanser: Soap and Water 1 x Per Week/30 Days Discharge Instructions: May shower and wash wound with dial antibacterial soap and water prior to dressing change. Cleanser: Vashe 5.8 (oz) 1 x Per Week/30 Days Discharge Instructions: Cleanse the wound with Vashe prior to applying a clean dressing using gauze sponges, not tissue or cotton balls. Peri-Wound Care: Sween Lotion (Moisturizing lotion) 1 x Per Week/30 Days Discharge Instructions: Apply moisturizing lotion as directed Topical: Gentamicin 1 x Per Week/30 Days Discharge Instructions: As directed by physician Topical: Mupirocin Ointment 1 x Per Week/30 Days Discharge Instructions: Apply Mupirocin (Bactroban) as instructed Prim Dressing: Maxorb Extra Ag+ Alginate Dressing, 2x2 (in/in) 1 x Per Week/30 Days ary Discharge Instructions: Apply to wound bed as instructed Secondary Dressing: ABD Pad, 8x10 1 x Per Week/30 Days Discharge Instructions: Apply over primary dressing as directed. Compression Wrap: Urgo K2 Lite, two layer compression system, regular 1 x Per Week/30 Days Discharge Instructions: Apply Urgo K2 Lite as directed (alternative to 3 layer compression). Wound #13 - Lower Leg Wound Laterality: Right, Anterior, Distal Cleanser: Soap and Water 1 x Per Week/30 Days Discharge Instructions: May shower and wash wound with dial antibacterial soap and water prior to dressing change. Cleanser: Vashe 5.8 (oz) 1 x Per Week/30 Days Discharge Instructions: Cleanse the wound with Vashe prior to applying a clean dressing using gauze sponges, not tissue or cotton balls. Peri-Wound Care: Sween Lotion (Moisturizing lotion) 1 x Per  Week/30 Days Discharge Instructions: Apply moisturizing lotion as directed Topical: Gentamicin 1 x Per Week/30 Days Discharge Instructions: As directed by physician Topical: Mupirocin Ointment 1 x Per Week/30 Days Discharge Instructions: Apply Mupirocin (Bactroban) as instructed Prim Dressing: Maxorb Extra Ag+ Alginate Dressing, 2x2 (in/in) 1 x Per Week/30 Days ary Discharge Instructions: Apply to wound bed as instructed Secondary Dressing: ABD Pad, 8x10 1 x Per Week/30 Days Discharge Instructions: Apply over primary dressing as directed. Compression Wrap: Urgo K2 Lite, two layer compression system, regular 1 x Per Week/30 Days Discharge Instructions: Apply Urgo K2 Lite as directed (alternative to 3 layer compression). DONAVON, KIMREY ([id number]) [id number] Page 5 of 11 Electronic Signature(s) Signed: 03/09/2023 3:46:02 PM By: Geralyn Corwin DO Entered By: Geralyn Corwin on 03/09/2023 15:08:20 -------------------------------------------------------------------------------- Problem List Details Patient Name: [name]: [name] [name] 03/09/2023 1:30 PM Medical Record Number: [id number] Patient Account Number: [id number] Date of Birth/Sex: Treating RN: 01-15-1941 (82 y.o. M) Primary Care Provider: [name] Other Clinician: Referring Provider: Treating Provider/Extender: [name] Weeks in Treatment: 1 Active Problems ICD-10 Encounter Code Description Active Date MDM Diagnosis 6140923903 Non-pressure chronic ulcer of other part of right lower leg with fat layer 03/02/2023 No Yes exposed L97.822 Non-pressure chronic ulcer of  other part of left lower leg with fat layer exposed4/22/2024 No Yes I87.313 Chronic venous hypertension (idiopathic) with ulcer of bilateral lower extremity 03/02/2023 No Yes N18.30 Chronic kidney disease, stage 3 unspecified 03/02/2023 No Yes Inactive Problems Resolved Problems Electronic  Signature(s) Signed: 03/09/2023 3:46:02 PM By: Geralyn Corwin DO Entered By: Geralyn Corwin on 03/09/2023 15:06:34 -------------------------------------------------------------------------------- Progress Note Details Patient Name: [name]: [name] [name] 03/09/2023 1:30 PM Medical Record Number: [id number] Patient Account Number: [id number] Date of Birth/Sex: Treating RN: 07-13-1941 (82 y.o. 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 He has been getting this dressed that [employer] and he had Xeroform on this with gauze. The patient is not exactly sure how this came about but he is not able to dress the wound himself because of limitations due to what sounds like spinal stenosis and pain. He had a wound on the ankle as well but that THURSTON, BRENDLINGER ([id number]) [id number] Page 6 of 11 healed over. He was seen by vascular in May 2021. This was related to a right great toe wound. He had an angiogram that was completely normal on both sides good perfusion right into the feet. He also looks like he has lymphedema and he has compression stockings but he cannot get them on and off  so he essentially leaves he is on even when he showering Past medical history includes prediabetes, low back pain secondary I think the lumbar spinal stenosis has been offered a decompressive laminectomy but he has not gone forth with this. 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 ABI was noncompressible in our clinic on the right right first toe. 4/7; patient I admitted the clinic last week. He has an area on the tip of his right first toe. We cleaned this off last week and have been using silver collagen. He is in the same crocs today that he wore last week and I told him not to use. For 1 reason or another he could not handle a surgical sandal. 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 He is not a diabetic 4/12; right first toe wound chronic. We have been using silver collagen. Once again he comes in then the same pair of old crocs that he comes in every week. 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 His x-ray suggested suspicious for osteomyelitis with bone irregularity at the tuft of the right great toe 4/21; patient has been using silver collagen every other day to the wound on his right great toe. He continues to wear crocs. He has no complaints or issues today. 5/5; 2-week follow-up. MRI that was  ordered last time showed acute osteomyelitis involving the tuft of the great toe distal. Mild soft tissue edema with enhancement at the distal aspect of the great toe suggestive of cellulitis. Fortunately the wound actually looks better. Has been using Hydrofera Blue 5/12; started him on Augmentin last week 875 twice daily he is tolerating this well. Last creatinine I see is 1.77 in early April. We are using Hydrofera Blue to the wound which actually looks better today. 5/17; tolerating the Augmentin well. He says the wound is actually been hurting him  episodically. Actually the surface of this looks quite good. He did not get his lab work done. We are using [facility] he is changing this himself 6/2; still taking Augmentin he should be rounding into his last week. This was empirically for underlying osteomyelitis. The area on the tip of his toe is still open still with some depth but no palpable bone. We have been using Hydrofera Blue. Surface area of the wound has not been making much improvement Is work was essentially unremarkable except for a creatinine of 1.79. Reason for his renal insufficiency is not clear he is not a diabetic does not have hypertension however he does take NSAIDs and Lasix I wonder if this is the issue here. Note that his creatinine on 11/12/2018 was 1.45. Platelet count slightly low at 143 however his inflammatory markers were really in the normal range at 10 and 2 sedimentation rate and C-reactive protein respectively. 6/9; he apparently had another 2 weeks of Augmentin after we look that this last time. Therefore he should be coming into his last week next week. We are using polymen on the tip of the toe. He cannot get down to do the dressing so he comes in for a nurse visit. He comes in in crocs I have warned him against this although he says he is using a different shoe for most of the day he works in [employer]. 6/16; he comes back in in crocs. I have spoken him about this before. His areas on the tip of his toe this actually looks quite good we have been using polymen. He has completed his antibiotics which we empirically gave him for underlying osteomyelitis. Everything looks better to me in spite of his noncompliance with footwear recommendation 7/11; patient has been followed through nurse visits. He was last seen by provider on 6/16. He reports improvement to the wound with the use of PolyMem. He denies signs of infection. 7/21; the patient arrives with the area on the tip of his right great toe completely  healed. He has the same proximal on that I see every time. I treated him empirically with Augmentin for osteomyelitis. He needs to be vigilant about this. READMISSION 03/19/2022 The patient returns with a reopening of the right great toe wound. Apparently this occurred in around October of last year. He was followed by podiatry for some time. They have performed x-rays but unfortunately, we do not have access to them or the results. No report as to whether or not he might have osteomyelitis. In addition, he has a new wound on the medial aspect of the right great toe as well as the anterior tibial surface of his right lower leg. He does state that he was wearing compression stockings when this tibial wound occurred. He continues to wear crocs. 04/02/2022: For some reason, the patient has not been seen in 2 weeks. He says that he had to reschedule his visit. Therefore his Coflex compression  wrap was left in situ for 14 days. It appears that his skin became macerated and he has opened up several new superficial wounds on his right anterior tibial surface. We did get an x-ray to evaluate for osteomyelitis and none was seen. All of the pre-existing wounds are smaller today and the new wounds look like they are limited to just the breakdown of skin. 04/10/2022: The wounds on his right anterior tibial surface are actually a little bit larger today. He seems to have some skin irritation and maceration in the area again. The tip of his toe is nearly closed as is the wound on the dorsal surface of his right great toe. READMISSION 07/10/2022 Since our last visit, the patient went to the operating room to have a left hip replacement, but in the preop holding area, his surgeon noted a foul odor coming from his right foot. He contacted me and described the findings of his right great toe and we mutually agreed, along with the patient, that amputation would be most appropriate. After he healed from the distal phalanx  toe amputation, he then had a left total hip arthroplasty. Since his surgery, he has been wearing compression stockings bilaterally, but as he is unable to apply or remove them himself, he wears them for a week at a time. This appears to have resulted in a pressure injury on his right anterior ankle at the point of flexion; he has a similar looking abrasion that has not broken the skin in the same site on the left. 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 07/21/2022: The right anterior ankle wound is nearly closed aside from a small open area that has some slough accumulation. The amputation site has crusting and eschar present. Once this was removed, it was revealed that the amputation site has healed. The abrasion on the left anterior ankle at the point of flexion is no longer present. 07/28/2022: The patient dropped a door on his leg and has a new wound to his right anterior tibial surface. There is some nonviable skin hanging from the cranial portion of the wound. The fat layer is exposed. The right anterior ankle wound is a little bit smaller and has some accumulated slough present. 08/04/2022: The wound on his right anterior tibial surface is a little bit smaller today. There is still slough accumulation. The right anterior ankle wound is quite a bit smaller today and also has slough buildup on the surface. 08/13/2022: The patient dropped another object that struck him in the left anterior tibial surface. This has resulted in wound at this location. It is fairly superficial but does involve the fat layer. The right anterior tibial surface wound has a little bit of eschar on the surface and underneath, it is quite small. The right anterior ankle wound has also contracted but does have slough and eschar buildup as well. 08/21/2022: The right anterior tibial wound has healed. The left anterior tibial wound is quite a bit smaller and more superficial with just a little  eschar on the surface. The right anterior ankle wound also continues to contract but continues to accumulate slough and eschar, as well. 08/27/2022: The left anterior tibial wound is closed. The right anterior ankle wound is considerably smaller with just a little eschar and slough buildup. 09/03/2022: His wounds are all healed. 03/02/2023 Mr. [name] is an 82 year old male with a past medical history of venous insufficiency that presents to clinic for a 1  month history of nonhealing wounds to his lower extremities bilaterally. He states that these occurred spontaneously. He was seen by vein and vascular who placed an Unna boot on the right and a compression stocking on the left. He has had this on for the past week. Prior to this he has compression stockings on that he leaves in place for weeks at a time. It is hard for him to take these on and off. He currently denies signs of infection. Unclear if the dressing has been used to the wound beds. ELIZAR, ALPERN ([id number]) [id number] Page 7 of 11 4/29; patient presents for follow-up. We have been using silver alginate with antibiotic ointment under 3 layer compression to the lower extremities bilaterally. The wounds are smaller today. Patient History Information obtained from Patient. Family History Diabetes - Maternal Grandparents, Heart Disease - Father, Hypertension - Father, Stroke - Father, Thyroid Problems - Mother,Child, No family history of Cancer, Hereditary Spherocytosis, Kidney Disease, Lung Disease, Seizures, Tuberculosis. Social History Never smoker, Marital Status - Single, Alcohol Use - Rarely, Drug Use - No History, Caffeine Use - Daily - coffee. Medical History Eyes Patient has history of Cataracts - 2019 Denies history of Glaucoma, Optic Neuritis Ear/Nose/Mouth/Throat Patient has history of Chronic sinus problems/congestion Denies history of Middle ear  problems Hematologic/Lymphatic Denies history of Anemia, Hemophilia, Human Immunodeficiency Virus, Lymphedema, Sickle Cell Disease Respiratory Patient has history of Asthma, Sleep Apnea Denies history of Aspiration, Chronic Obstructive Pulmonary Disease (COPD), Pneumothorax, Tuberculosis Cardiovascular Patient has history of Hypertension, Peripheral Venous Disease Denies history of Angina, Arrhythmia, Congestive Heart Failure, Coronary Artery Disease, Deep Vein Thrombosis, Hypotension, Myocardial Infarction, Peripheral Arterial Disease, Phlebitis, Vasculitis Gastrointestinal Denies history of Cirrhosis , Colitis, Crohnoos, Hepatitis A, Hepatitis B, Hepatitis C Endocrine Denies history of Type I Diabetes, Type II Diabetes Genitourinary Denies history of End Stage Renal Disease Immunological Denies history of Lupus Erythematosus, Raynaudoos, Scleroderma Integumentary (Skin) Denies history of History of Burn Musculoskeletal Patient has history of Osteoarthritis Denies history of Gout, Rheumatoid Arthritis, Osteomyelitis Neurologic Patient has history of Neuropathy Denies history of Dementia, Quadriplegia, Paraplegia, Seizure Disorder Oncologic Denies history of Received Chemotherapy, Received Radiation Psychiatric Denies history of Anorexia/bulimia, Confinement Anxiety Hospitalization/Surgery History - right great toe amputation 2023. - right hip replacement 2023. - left knee replacement. - RSV jan 2024. Medical A Surgical History Notes nd Gastrointestinal GERD Genitourinary BPH Musculoskeletal Degenerative disc disease Neurologic Neuromuscular disorder Objective Constitutional respirations regular, non-labored and within target range for patient.. Vitals Time Taken: 1:00 PM, Height: 70 in, Weight: 250 lbs, BMI: 35.9, Temperature: 98.1 F, Pulse: 77 bpm, Respiratory Rate: 20 breaths/min, Blood Pressure: 143/80 mmHg. Cardiovascular 2+ dorsalis pedis/posterior tibialis  pulses. Psychiatric pleasant and cooperative. General Notes: Bilateral lower extremity wounds with granulation tissue to all wound beds except for the anterior right leg wound. This has slough. No signs of surrounding infection. Good edema control. YOVANY, CLOCK ([id number]) [id number] Page 8 of 11 Integumentary (Hair, Skin) Wound #11 status is Open. Original cause of wound was Blister. The date acquired was: 02/16/2023. The wound has been in treatment 1 weeks. The wound is located on the Left,Anterior Lower Leg. The wound measures 0.1cm length x 0.1cm width x 0.1cm depth; 0.008cm^2 area and 0.001cm^3 volume. There is Fat Layer (Subcutaneous Tissue) exposed. There is no tunneling or undermining noted. There is a medium amount of serosanguineous drainage noted. There is large (67-100%) red, pink granulation within the wound bed. There is no necrotic tissue within the wound bed. The periwound  skin appearance did not exhibit: Callus, Crepitus, Excoriation, Induration, Rash, Scarring, Dry/Scaly, Maceration, Atrophie Blanche, Cyanosis, Ecchymosis, Hemosiderin Staining, Mottled, Pallor, Rubor, Erythema. Wound #12 status is Healed - Epithelialized. Original cause of wound was Blister. The date acquired was: 02/16/2023. The wound has been in treatment 1 weeks. The wound is located on the Right,Proximal,Anterior Lower Leg. The wound measures 0cm length x 0cm width x 0cm depth; 0cm^2 area and 0cm^3 volume. There is Fat Layer (Subcutaneous Tissue) exposed. There is no tunneling or undermining noted. There is a medium amount of serosanguineous drainage noted. There is large (67-100%) pink granulation within the wound bed. There is no necrotic tissue within the wound bed. The periwound skin appearance did not exhibit: Callus, Crepitus, Excoriation, Induration, Rash, Scarring, Dry/Scaly, Maceration, Atrophie Blanche, Cyanosis, Ecchymosis, Hemosiderin Staining, Mottled, Pallor, Rubor,  Erythema. Wound #13 status is Open. Original cause of wound was Blister. The date acquired was: 02/16/2023. The wound has been in treatment 1 weeks. The wound is located on the Right,Distal,Anterior Lower Leg. The wound measures 0.5cm length x 0.3cm width x 0.1cm depth; 0.118cm^2 area and 0.012cm^3 volume. There is Fat Layer (Subcutaneous Tissue) exposed. There is no tunneling or undermining noted. There is a medium amount of serosanguineous drainage noted. There is small (1-33%) red, pink granulation within the wound bed. There is a large (67-100%) amount of necrotic tissue within the wound bed including Adherent Slough. The periwound skin appearance exhibited: Dry/Scaly. The periwound skin appearance did not exhibit: Callus, Crepitus, Excoriation, Induration, Rash, Scarring, Maceration, Atrophie Blanche, Cyanosis, Ecchymosis, Hemosiderin Staining, Mottled, Pallor, Rubor, Erythema. 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 The left lower extremity wounds are almost completely closed. I debrided nonviable tissue to the right lower extremity wound. I recommended continuing with antibiotic ointment and silver alginate under 3 layer compression to lower extremities bilaterally. Follow-up in 1 week. Procedures Wound #13 Pre-procedure diagnosis of Wound #13 is a Venous Leg Ulcer located on the Right,Distal,Anterior Lower Leg .Severity of Tissue Pre Debridement is: Fat layer exposed. There was a Selective/Open Wound Skin/Epidermis Debridement with a total area of 0.12 sq cm performed by Geralyn Corwin, DO. With the following instrument(s): Curette to remove Viable and Non-Viable  tissue/material. Material removed includes Slough, Skin: Dermis, and Skin: Epidermis after achieving pain control using Lidocaine. No specimens were taken. A time out was conducted at 13:52, prior to the start of the procedure. A Minimum amount of bleeding was controlled with Pressure. The procedure was tolerated well with a pain level of 0 throughout and a pain level of 0 following the procedure. Post Debridement Measurements: 0.5cm length x 0.3cm width x 0.1cm depth; 0.012cm^3 volume. Character of Wound/Ulcer Post Debridement is improved. Severity of Tissue Post Debridement is: Fat layer exposed. Post procedure Diagnosis Wound #13: Same as Pre-Procedure Pre-procedure diagnosis of Wound #13 is a Venous Leg Ulcer located on the Right,Distal,Anterior Lower Leg . There was a Three Layer Compression Therapy Procedure by Fonnie Mu, RN. Post procedure Diagnosis Wound #13: Same as Pre-Procedure Wound #11 Pre-procedure diagnosis of Wound #11 is a Venous Leg Ulcer located on the Left,Anterior Lower Leg . There was a Three Layer Compression Therapy Procedure by Fonnie Mu, RN. Post procedure Diagnosis Wound #11: Same as Pre-Procedure Plan  Follow-up Appointments: Return Appointment in 1 week. - w/ Dr. Mikey Bussing and Maryruth Bun RM # 9 Monday 03/16/23 @ 1:15 Anesthetic: Wound #11 Left,Anterior Lower Leg: (In clinic) Topical Lidocaine 4% applied to wound bed Wound #13 Right,Distal,Anterior Lower Leg: (In clinic) Topical Lidocaine 4% applied to wound bed Bathing/ Shower/ Hygiene: May shower with protection but do not get wound dressing(s) wet. Protect dressing(s) with water repellant cover (for example, large plastic bag) or a cast cover and may then take shower. Edema Control - Lymphedema / SCD / Other: Elevate legs to the level of the heart or above for 30 minutes daily and/or when sitting for 3-4 times a day throughout the day. Avoid standing for long periods of time. LEAM, MADERO  ([id number]) [id number] Page 9 of 11 WOUND #11: - Lower Leg Wound Laterality: Left, Anterior Cleanser: Soap and Water 1 x Per Week/30 Days Discharge Instructions: May shower and wash wound with dial antibacterial soap and water prior to dressing change. Cleanser: Vashe 5.8 (oz) 1 x Per Week/30 Days Discharge Instructions: Cleanse the wound with Vashe prior to applying a clean dressing using gauze sponges, not tissue or cotton balls. Peri-Wound Care: Sween Lotion (Moisturizing lotion) 1 x Per Week/30 Days Discharge Instructions: Apply moisturizing lotion as directed Topical: Gentamicin 1 x Per Week/30 Days Discharge Instructions: As directed by physician Topical: Mupirocin Ointment 1 x Per Week/30 Days Discharge Instructions: Apply Mupirocin (Bactroban) as instructed Prim Dressing: Maxorb Extra Ag+ Alginate Dressing, 2x2 (in/in) 1 x Per Week/30 Days ary Discharge Instructions: Apply to wound bed as instructed Secondary Dressing: ABD Pad, 8x10 1 x Per Week/30 Days Discharge Instructions: Apply over primary dressing as directed. Com pression Wrap: Urgo K2 Lite, two layer compression system, regular 1 x Per Week/30 Days Discharge Instructions: Apply Urgo K2 Lite as directed (alternative to 3 layer compression). WOUND #13: - Lower Leg Wound Laterality: Right, Anterior, Distal Cleanser: Soap and Water 1 x Per Week/30 Days Discharge Instructions: May shower and wash wound with dial antibacterial soap and water prior to dressing change. Cleanser: Vashe 5.8 (oz) 1 x Per Week/30 Days Discharge Instructions: Cleanse the wound with Vashe prior to applying a clean dressing using gauze sponges, not tissue or cotton balls. Peri-Wound Care: Sween Lotion (Moisturizing lotion) 1 x Per Week/30 Days Discharge Instructions: Apply moisturizing lotion as directed Topical: Gentamicin 1 x Per Week/30 Days Discharge Instructions: As directed by physician Topical: Mupirocin Ointment 1 x  Per Week/30 Days Discharge Instructions: Apply Mupirocin (Bactroban) as instructed Prim Dressing: Maxorb Extra Ag+ Alginate Dressing, 2x2 (in/in) 1 x Per Week/30 Days ary Discharge Instructions: Apply to wound bed as instructed Secondary Dressing: ABD Pad, 8x10 1 x Per Week/30 Days Discharge Instructions: Apply over primary dressing as directed. Com pression Wrap: Urgo K2 Lite, two layer compression system, regular 1 x Per Week/30 Days Discharge Instructions: Apply Urgo K2 Lite as directed (alternative to 3 layer compression). 1. In office sharp debridement 2. Silver alginate with antibiotic ointment under 3 layer compression to the lower extremities bilaterally 3. Follow-up in 1 week Electronic Signature(s) Signed: 03/09/2023 3:46:02 PM By: Geralyn Corwin DO Entered By: Geralyn Corwin on 03/09/2023 15:10:23 -------------------------------------------------------------------------------- HxROS Details Patient Name: [name]: [name] [name] 03/09/2023 1:30 PM Medical Record Number: [id number] Patient Account Number: [id number] Date of Birth/Sex: Treating RN: 07/03/1941 (82 y.o. M) Primary Care Provider: [name] Other Clinician: Referring Provider: Treating Provider/Extender: [name] Weeks in Treatment: 1 Information Obtained From Patient Eyes Medical History: Positive  for: Cataracts - 2019 Negative for: Glaucoma; Optic Neuritis Ear/Nose/Mouth/Throat Medical History: Positive for: Chronic sinus problems/congestion Negative for: Middle ear problems Hematologic/Lymphatic Medical History: Negative for: Anemia; Hemophilia; Human Immunodeficiency Virus; Lymphedema; Sickle Cell Disease Respiratory Medical History: Positive for: Asthma; Sleep Apnea Negative for: Aspiration; Chronic Obstructive Pulmonary Disease (COPD); Pneumothorax; Tuberculosis JOREL, GRAVLIN ([id number]) [id number] Page 10 of  11 Cardiovascular Medical History: Positive for: Hypertension; Peripheral Venous Disease Negative for: Angina; Arrhythmia; Congestive Heart Failure; Coronary Artery Disease; Deep Vein Thrombosis; Hypotension; Myocardial Infarction; Peripheral Arterial Disease; Phlebitis; Vasculitis Gastrointestinal Medical History: Negative for: Cirrhosis ; Colitis; Crohns; Hepatitis A; Hepatitis B; Hepatitis C Past Medical History Notes: GERD Endocrine Medical History: Negative for: Type I Diabetes; Type II Diabetes Genitourinary Medical History: Negative for: End Stage Renal Disease Past Medical History Notes: BPH Immunological Medical History: Negative for: Lupus Erythematosus; Raynauds; Scleroderma Integumentary (Skin) Medical History: Negative for: History of Burn Musculoskeletal Medical History: Positive for: Osteoarthritis Negative for: Gout; Rheumatoid Arthritis; Osteomyelitis Past Medical History Notes: Degenerative disc disease Neurologic Medical History: Positive for: Neuropathy Negative for: Dementia; Quadriplegia; Paraplegia; Seizure Disorder Past Medical History Notes: Neuromuscular disorder Oncologic Medical History: Negative for: Received Chemotherapy; Received Radiation Psychiatric Medical History: Negative for: Anorexia/bulimia; Confinement Anxiety HBO Extended History Items Ear/Nose/Mouth/Throat: Eyes: Chronic sinus Cataracts problems/congestion Immunizations Pneumococcal Vaccine: Received Pneumococcal Vaccination: Yes Received Pneumococcal Vaccination On or After 60th Birthday: No Implantable Devices Yes Hospitalization / Surgery History MICHAH, MINTON ([id number]) [id number] Page 11 of 11 Type of Hospitalization/Surgery 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 left knee replacement RSV jan 2024 Family and Social History Cancer: No; Diabetes: Yes - Maternal Grandparents; Heart Disease: Yes - Father;  Hereditary Spherocytosis: No; Hypertension: Yes - Father; Kidney Disease: No; Lung Disease: No; Seizures: No; Stroke: Yes - Father; Thyroid Problems: Yes - Mother,Child; Tuberculosis: No; Never smoker; Marital Status - Single; Alcohol Use: Rarely; Drug Use: No History; Caffeine Use: Daily - coffee; Financial Concerns: No; Food, Clothing or Shelter Needs: No; Support System Lacking: No; Transportation Concerns: No Electronic Signature(s) Signed: 03/09/2023 3:46:02 PM By: Geralyn Corwin DO Entered By: Geralyn Corwin on 03/09/2023 15:07:29 -------------------------------------------------------------------------------- SuperBill Details Patient Name: [name]: [name] [name] 03/09/2023 Medical Record Number: [id number] Patient Account Number: [id number] Date of Birth/Sex: Treating RN: [birth year] (82 y.o. Charlean Merl, [name] Primary Care Provider: [name] Other Clinician: Referring Provider: Treating Provider/Extender: [name] Weeks in Treatment: 1 Diagnosis Coding ICD-10 Codes Code Description [phone] Non-pressure chronic ulcer of other part of right lower leg with fat layer exposed L97.822 Non-pressure chronic ulcer of other part of left lower leg with fat layer exposed I87.313 Chronic venous hypertension (idiopathic) with ulcer of bilateral lower extremity N18.30 Chronic kidney disease, stage 3 unspecified Facility Procedures : CPT4 Code: 46962952 Description: 97597 - DEBRIDE WOUND 1ST 20 SQ CM OR < ICD-10 Diagnosis Description L97.812 Non-pressure chronic ulcer of other part of right lower leg with fat layer exposed Modifier: Quantity: 1 : CPT4 Code: 84132440 Description: (Facility Use Only) 29581LT - APPLY MULTLAY COMPRS LWR LT LEG Modifier: Quantity: 1 Physician Procedures : CPT4 Code Description Modifier [id number] - WC PHYS LEVEL 3 - EST PT 25 ICD-10 Diagnosis Description L97.822 Non-pressure chronic ulcer of other part of  left lower leg with fat layer exposed I87.313 Chronic venous hypertension (idiopathic) with  ulcer of bilateral lower extremity Quantity: 1 : [id number] - WC PHYS DEBR WO ANESTH 20 SQ CM ICD-10 Diagnosis Description L97.812 Non-pressure chronic ulcer  of other part of right lower leg with fat layer exposed Quantity: 1 Electronic Signature(s) Signed: 03/09/2023 3:46:02 PM By: Geralyn Corwin DO Entered By: Geralyn Corwin on 03/09/2023 15:10:49

## 2023-03-12 ENCOUNTER — Encounter (HOSPITAL_COMMUNITY): Payer: Self-pay | Admitting: Family Medicine

## 2023-03-12 DIAGNOSIS — R600 Localized edema: Secondary | ICD-10-CM

## 2023-03-12 DIAGNOSIS — M25561 Pain in right knee: Secondary | ICD-10-CM | POA: Diagnosis not present

## 2023-03-12 DIAGNOSIS — M25552 Pain in left hip: Secondary | ICD-10-CM | POA: Diagnosis not present

## 2023-03-12 DIAGNOSIS — S83241A Other tear of medial meniscus, current injury, right knee, initial encounter: Secondary | ICD-10-CM | POA: Diagnosis not present

## 2023-03-12 DIAGNOSIS — R140 Abdominal distension (gaseous): Secondary | ICD-10-CM | POA: Diagnosis not present

## 2023-03-12 DIAGNOSIS — R110 Nausea: Secondary | ICD-10-CM | POA: Diagnosis not present

## 2023-03-12 DIAGNOSIS — Z6841 Body Mass Index (BMI) 40.0 and over, adult: Secondary | ICD-10-CM | POA: Diagnosis not present

## 2023-03-12 NOTE — Progress Notes (Signed)
[name] ([id number]) [id number] Page 1 of 11 Visit Report for 03/09/2023 Arrival Information Details Patient Name: [name]: [name], [name] [name] 03/09/2023 1:30 PM Medical Record Number: [id number] Patient Account Number: [id number] Date of Birth/Sex: Treating RN: [name] (82 y.o. M) Primary Care [name]: [name] Other Clinician: Referring [name]: Treating [name]/Extender: [name]: 1 Visit Information History Since Last Visit Added or deleted any medications: No Patient Arrived: [name] Any new allergies or adverse reactions: No Arrival Time: 12:58 Had a fall or experienced change in No Accompanied By: self activities of daily living that [name] affect Transfer Assistance: None risk of falls: Patient Identification Verified: Yes Signs or symptoms of abuse/neglect since last visito No Secondary Verification Process Completed: Yes Hospitalized since last visit: No Patient Requires Transmission-Based Precautions: No Implantable device outside of the clinic excluding No Patient Has Alerts: No cellular tissue based products placed in the center since last visit: Has Compression in Place as Prescribed: Yes Pain Present Now: Yes Electronic Signature(s) Signed: 03/11/2023 4:40:31 PM By: [name] Entered By: [name] on 03/09/2023 13:00:09 -------------------------------------------------------------------------------- Compression Therapy Details Patient Name: [name]: [name] [name] 03/09/2023 1:30 PM Medical Record Number: [id number] Patient Account Number: [id number] Date of Birth/Sex: Treating RN: Hans[birth year] (83 y.o. [name] Primary Care Keyvin Rison: [name] Other Clinician: Referring Shikita Vaillancourt: Treating Starling Christofferson/Extender: [name]: 1 Compression Therapy Performed for Wound Assessment: Wound #11  Left,Anterior Lower Leg Performed By: Clinician [name] Mu, RN Compression Type: Three Layer Post Procedure Diagnosis Same as Pre-procedure Electronic Signature(s) Signed: 03/09/2023 3:16:56 PM By: [name] Mu RN Entered By: [name] on 03/09/2023 13:53:27 -------------------------------------------------------------------------------- Compression Therapy Details Patient Name: [name]: [name] [name] 03/09/2023 1:30 PM Medical Record Number: [id number] Patient Account Number: [id number] Date of Birth/Sex: Treating RN: [birth year] (82 y.o. [name] Primary Care Valorie Mcgrory: [name] Other Clinician: Referring Kylene Zamarron: Treating Rosaria Kubin/Extender: [name]: 1 Compression Therapy Performed for Wound Assessment: Wound #13 Right,Distal,Anterior Lower Leg Performed By: Clinician [name] Mu, RN Compression Type: Three Layer Post Procedure Diagnosis Same as Pre-procedure [name], [name] ([id number]) [id number] Page 2 of 11 Electronic Signature(s) Signed: 03/09/2023 3:16:56 PM By: [name] Mu RN Entered By: [name] on 03/09/2023 13:53:27 -------------------------------------------------------------------------------- Encounter Discharge Information Details Patient Name: [name]: [name] [name] 03/09/2023 1:30 PM Medical Record Number: [id number] Patient Account Number: [id number] Date of Birth/Sex: Treating RN: [birth year]Hanson (82 y.o. Charlean Merl, [name] Primary Care Kaycee Mcgaugh: [name] Other Clinician: Referring Schawn Byas: Treating Daaiel Starlin/Extender: [name]: 1 Encounter Discharge Information Items Post Procedure Vitals Discharge Condition: Stable Temperature (F): 98.7 Ambulatory Status: Ambulatory Pulse (bpm): 74 Discharge Destination: Home Respiratory Rate (breaths/min):  17 Transportation: Private Auto Blood Pressure (mmHg): 138/84 Accompanied By: self Schedule Follow-up Appointment: Yes Clinical Summary of Care: Patient Declined Electronic Signature(s) Signed: 03/09/2023 3:16:56 PM By: [name] Mu RN Entered By: [name] on 03/09/2023 13:57:00 -------------------------------------------------------------------------------- Lower Extremity Assessment Details Patient Name: [name]: [name] [name] 03/09/2023 1:30 PM Medical Record Number: [id number] Patient Account Number: [id number] Date of Birth/Sex: Treating RN: [birth year] (82 y.o. M) Primary Care Hillery Zachman: [name] Other Clinician: Referring Xzander Gilham: Treating Ahnya Akre/Extender: [name]: 1 Edema Assessment Assessed: [Left: No] [Right: No] Edema: [Left: Yes] [Right: Yes] Calf Left: Right: Point of Measurement: 37 cm From  Medial Instep 38.5 cm 45 cm Ankle Left: Right: Point of Measurement: 11.5 cm From Medial Instep 24.2 cm 24 cm Electronic Signature(s) Signed: 03/11/2023 4:40:31 PM By: [name] Entered By: [name] on 03/09/2023 13:12:27 -------------------------------------------------------------------------------- Multi Wound Chart Details Patient Name: [name]: [name] [name] 03/09/2023 1:30 PM Medical Record Number: [id number] Patient Account Number: [id number] Date of Birth/Sex: Treating RN: Hans[birth year] (82 y.o. M) Primary Care Ebon Ketchum: [name] Other Clinician: Referring Gwynevere Lizana: Treating Aurilla Coulibaly/Extender: [name]: 1 Vital Signs [name], [name] ([id number]) [id number] Page 3 of 11 Height(in): 70 Pulse(bpm): 77 Weight(lbs): 250 Blood Pressure(mmHg): 143/80 Body Mass Index(BMI): 35.9 Temperature(F): 98.1 Respiratory Rate(breaths/min): 20 [11:Photos:] Left, Anterior Lower Leg Right, Proximal,  Anterior Lower Leg Right, Distal, Anterior Lower Leg Wound Location: Blister Blister Blister 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 Venous Leg Ulcer Primary Etiology: Cataracts, Chronic sinus Cataracts, Chronic sinus Cataracts, Chronic sinus Comorbid History: problems/congestion, Asthma, Sleep problems/congestion, Asthma, Sleep problems/congestion, Asthma, Sleep Apnea, Hypertension, Peripheral Apnea, Hypertension, Peripheral Apnea, Hypertension, Peripheral Venous Disease, Osteoarthritis, Venous Disease, Osteoarthritis, Venous Disease, Osteoarthritis, Neuropathy Neuropathy Neuropathy 02/16/2023 02/16/2023 02/16/2023 Date Acquired: 1 1 1  Weeks of Hanson: Open Healed - Epithelialized Open Wound Status: No No No Wound Recurrence: Yes No No Clustered Wound: 2 N/A N/A Clustered Quantity: 0.1x0.1x0.1 0x0x0 0.5x0.3x0.1 Measurements L x W x D (cm) 0.008 0 0.118 A (cm) : rea 0.001 0 0.012 Volume (cm) : 99.70% 100.00% 81.20% % Reduction in A rea: 99.60% 100.00% 81.00% % Reduction in Volume: Full Thickness Without Exposed Full Thickness Without Exposed Full Thickness Without Exposed Classification: Support Structures Support Structures Support Structures Medium Medium Medium Exudate A mount: Serosanguineous Serosanguineous Serosanguineous Exudate Type: red, brown red, brown red, brown Exudate Color: Large (67-100%) Large (67-100%) Small (1-33%) Granulation A mount: Red, Pink Pink Red, Pink Granulation Quality: None Present (0%) None Present (0%) Large (67-100%) Necrotic A mount: Fat Layer (Subcutaneous Tissue): Yes Fat Layer (Subcutaneous Tissue): Yes Fat Layer (Subcutaneous Tissue): Yes Exposed Structures: Fascia: No Fascia: No Fascia: No Tendon: No Tendon: No Tendon: No Muscle: No Muscle: No Muscle: No Joint: No Joint: No Joint: No Bone: No Bone: No Bone: No None None None Epithelialization: N/A N/A Debridement - Selective/Open  Wound Debridement: Pre-procedure Verification/Time Out N/A N/A 13:52 Taken: N/A N/A Lidocaine Pain Control: N/A N/A Slough Tissue Debrided: N/A N/A Skin/Epidermis Level: N/A N/A 0.12 Debridement A (sq cm): rea N/A N/A Curette Instrument: N/A N/A Minimum Bleeding: N/A N/A Pressure Hemostasis A chieved: N/A N/A 0 Procedural Pain: N/A N/A 0 Post Procedural Pain: N/A N/A Procedure was tolerated well Debridement Hanson Response: N/A N/A 0.5x0.3x0.1 Post Debridement Measurements L x W x D (cm) N/A N/A 0.012 Post Debridement Volume: (cm) Excoriation: No Excoriation: No Excoriation: No Periwound Skin Texture: Induration: No Induration: No Induration: No Callus: No Callus: No Callus: No Crepitus: No Crepitus: No Crepitus: No Rash: No Rash: No Rash: No Scarring: No Scarring: No Scarring: No Maceration: No Maceration: No Dry/Scaly: Yes Periwound Skin Moisture: Dry/Scaly: No Dry/Scaly: No Maceration: No Atrophie Blanche: No Atrophie Blanche: No Atrophie Blanche: No Periwound Skin Color: Cyanosis: No Cyanosis: No Cyanosis: No Ecchymosis: No Ecchymosis: No Ecchymosis: No Erythema: No Erythema: No Erythema: No Hemosiderin Staining: No Hemosiderin Staining: No Hemosiderin Staining: No Mottled: No Mottled: No Mottled: No Pallor: No Pallor: No Pallor: No Rubor: No Rubor: No Rubor: No [name], [name] ([id number]) [id number] Page 4 of 11 Compression Therapy N/A Compression Therapy Procedures Performed:  Debridement Hanson Notes Wound #11 (Lower Leg) Wound Laterality: Left, Anterior Cleanser Soap and Water Discharge Instruction: [name] shower and wash wound with dial antibacterial soap and water prior to dressing change. Vashe 5.8 (oz) Discharge Instruction: Cleanse the wound with Vashe prior to applying a clean dressing using gauze sponges, not tissue or cotton balls. Peri-Wound Care Sween Lotion (Moisturizing  lotion) Discharge Instruction: Apply moisturizing lotion as directed Topical Gentamicin Discharge Instruction: As directed by physician Mupirocin Ointment Discharge Instruction: Apply Mupirocin (Bactroban) as instructed Primary Dressing Maxorb Extra Ag+ Alginate Dressing, 2x2 (in/in) Discharge Instruction: Apply to wound bed as instructed Secondary Dressing ABD Pad, 8x10 Discharge Instruction: Apply over primary dressing as directed. Secured With Compression Wrap Urgo K2 Lite, two layer compression system, regular Discharge Instruction: Apply Urgo K2 Lite as directed (alternative to 3 layer compression). Compression Stockings Add-Ons Wound #12 (Lower Leg) Wound Laterality: Right, Anterior, Proximal Cleanser Peri-Wound Care Topical Primary Dressing Secondary Dressing Secured With Compression Wrap Compression Stockings Add-Ons Wound #13 (Lower Leg) Wound Laterality: Right, Anterior, Distal Cleanser Soap and Water Discharge Instruction: [name] shower and wash wound with dial antibacterial soap and water prior to dressing change. Vashe 5.8 (oz) Discharge Instruction: Cleanse the wound with Vashe prior to applying a clean dressing using gauze sponges, not tissue or cotton balls. Peri-Wound Care Sween Lotion (Moisturizing lotion) Discharge Instruction: Apply moisturizing lotion as directed Topical Gentamicin Discharge Instruction: As directed by physician [name] ([id number]) [id number] Page 5 of 11 Mupirocin Ointment Discharge Instruction: Apply Mupirocin (Bactroban) as instructed Primary Dressing Maxorb Extra Ag+ Alginate Dressing, 2x2 (in/in) Discharge Instruction: Apply to wound bed as instructed Secondary Dressing ABD Pad, 8x10 Discharge Instruction: Apply over primary dressing as directed. Secured With Compression Wrap Urgo K2 Lite, two layer compression system, regular Discharge Instruction: Apply Urgo K2 Lite as directed  (alternative to 3 layer compression). Compression Stockings Add-Ons Electronic Signature(s) Signed: 03/09/2023 3:46:02 PM By: Geralyn Corwin DO Entered By: Geralyn Corwin on 03/09/2023 15:06:39 -------------------------------------------------------------------------------- Multi-Disciplinary Care Plan Details Patient Name: [name]: [name] [name] 03/09/2023 1:30 PM Medical Record Number: [id number] Patient Account Number: [id number] Date of Birth/Sex: Treating RN: [birth year]Hanson (82 y.o. Charlean Merl, [name] Primary Care Nyjah Schwake: [name] Other Clinician: Referring Tyshan Enderle: Treating Loney Peto/Extender: [name]: 1 Active Inactive Venous Leg Ulcer Nursing Diagnoses: Actual venous Insuffiency (use after diagnosis is confirmed) Knowledge deficit related to disease process and management Goals: Patient will maintain optimal edema control Date Initiated: 03/02/2023 Target Resolution Date: 04/08/2023 Goal Status: Active Patient/caregiver will verbalize understanding of disease process and disease management Date Initiated: 03/02/2023 Target Resolution Date: 03/31/2023 Goal Status: Active Interventions: Assess peripheral edema status every visit. Compression as ordered Provide education on venous insufficiency Notes: Wound/Skin Impairment Nursing Diagnoses: Impaired tissue integrity Knowledge deficit related to ulceration/compromised skin integrity Goals: Patient/caregiver will verbalize understanding of skin care regimen Date Initiated: 03/02/2023 Target Resolution Date: 04/08/2023 Goal Status: Active Ulcer/skin breakdown will have a volume reduction of 30% by week 4 Date Initiated: 03/02/2023 Target Resolution Date: 03/31/2023 [name], [name] ([id number]) [id number].pdf Page 6 of 11 Goal Status: Active Interventions: Assess patient/caregiver ability to obtain necessary supplies Assess  patient/caregiver ability to perform ulcer/skin care regimen upon admission and as needed Assess ulceration(s) every visit Provide education on ulcer and skin care Notes: Electronic Signature(s) Signed: 03/09/2023 3:16:56 PM By: [name] Mu RN Entered By: [name] on 03/09/2023 13:55:56 -------------------------------------------------------------------------------- Pain Assessment Details Patient Name: [name]: [name] [name] 03/09/2023  1:30 PM Medical Record Number: [id number] Patient Account Number: [id number] Date of Birth/Sex: Treating RN: [birth year]Hanson (82 y.o. M) Primary Care Aarsh Fristoe: [name] Other Clinician: Referring Corlette Ciano: Treating Kyson Kupper/Extender: [name]: 1 Active Problems Location of Pain Severity and Description of Pain Patient Has Paino Yes Site Locations Pain Location: Pain in Ulcers Rate the pain. Current Pain Level: 3 Pain Management and Medication Current Pain Management: Electronic Signature(s) Signed: 03/11/2023 4:40:31 PM By: [name] Entered By: [name] on 03/09/2023 13:11:48 -------------------------------------------------------------------------------- Patient/Caregiver Education Details Patient Name: [name]: [name] RD Lacretia Nicks 4/29/2024andnbsp1:30 PM Medical Record Number: [id number] Patient Account Number: [id number] Date of Birth/Gender: Treating RN: [birth year] (83 y.o. [name] Primary Care Physician: [name] Other Clinician: Referring Physician: Treating Physician/Extender: Toma Deiters in Hanson: 1 Education Assessment Education Provided To: [name] ([id number]) [id number] Page 7 of 11 Patient Education Topics Provided Wound/Skin Impairment: Methods: Explain/Verbal Responses: Reinforcements needed, State content correctly Electronic  Signature(s) Signed: 03/09/2023 3:16:56 PM By: [name] Mu RN Entered By: [name] on 03/09/2023 13:56:05 -------------------------------------------------------------------------------- Wound Assessment Details Patient Name: [name]: [name] [name] 03/09/2023 1:30 PM Medical Record Number: [id number] Patient Account Number: [id number] Date of Birth/Sex: Treating RN: [birth year]Hanson (82 y.o. M) Primary Care Hendry Speas: [name] Other Clinician: Referring Kwamane Whack: Treating Mariyanna Mucha/Extender: [name]: 1 Wound Status Wound Number: 11 Primary Venous Leg Ulcer Etiology: Wound Location: Left, Anterior Lower Leg Wound Open Wounding Event: Blister Status: Date Acquired: 02/16/2023 Comorbid Cataracts, Chronic sinus problems/congestion, Asthma, Sleep Weeks Of Hanson: 1 History: Apnea, Hypertension, Peripheral Venous Disease, Osteoarthritis, Clustered Wound: Yes Neuropathy Photos Wound Measurements Length: (cm) Width: (cm) Depth: (cm) Clustered Quantity: Area: (cm) Volume: (cm) 0.1 % Reduction in Area: 99.7% 0.1 % Reduction in Volume: 99.6% 0.1 Epithelialization: None 2 Tunneling: No 0.008 Undermining: No 0.001 Wound Description Classification: Full Thickness Without Exposed Sup Exudate Amount: Medium Exudate Type: Serosanguineous Exudate Color: red, brown port Structures Foul Odor After Cleansing: No Slough/Fibrino Yes Wound Bed Granulation Amount: Large (67-100%) Exposed Structure Granulation Quality: Red, Pink Fascia Exposed: No Necrotic Amount: None Present (0%) Fat Layer (Subcutaneous Tissue) Exposed: Yes Tendon Exposed: No Muscle Exposed: No Joint Exposed: No Bone Exposed: No Periwound Skin Texture Texture Color No Abnormalities Noted: No No Abnormalities Noted: No [name], [name] ([id number]) [id number] Page 8 of 11 Callus: No Atrophie Blanche: No Crepitus:  No Cyanosis: No Excoriation: No Ecchymosis: No Induration: No Erythema: No Rash: No Hemosiderin Staining: No Scarring: No Mottled: No Pallor: No Moisture Rubor: No No Abnormalities Noted: No Dry / Scaly: No Maceration: No Hanson Notes Wound #11 (Lower Leg) Wound Laterality: Left, Anterior Cleanser Soap and Water Discharge Instruction: [name] shower and wash wound with dial antibacterial soap and water prior to dressing change. Vashe 5.8 (oz) Discharge Instruction: Cleanse the wound with Vashe prior to applying a clean dressing using gauze sponges, not tissue or cotton balls. Peri-Wound Care Sween Lotion (Moisturizing lotion) Discharge Instruction: Apply moisturizing lotion as directed Topical Gentamicin Discharge Instruction: As directed by physician Mupirocin Ointment Discharge Instruction: Apply Mupirocin (Bactroban) as instructed Primary Dressing Maxorb Extra Ag+ Alginate Dressing, 2x2 (in/in) Discharge Instruction: Apply to wound bed as instructed Secondary Dressing ABD Pad, 8x10 Discharge Instruction: Apply over primary dressing as directed. Secured With Compression Wrap Urgo K2 Lite, two layer compression system, regular Discharge Instruction: Apply Urgo K2 Lite as directed (alternative to 3 layer compression). Compression  Stockings Facilities manager) Signed: 03/11/2023 4:40:31 PM By: [name] Entered By: [name] on 03/09/2023 13:15:54 -------------------------------------------------------------------------------- Wound Assessment Details Patient Name: [name]: [name] [name] 03/09/2023 1:30 PM Medical Record Number: [id number] Patient Account Number: [id number] Date of Birth/Sex: Treating RN: Hanson[birth year] (82 y.o. Charlean Merl, [name] Primary Care Zykerria Tanton: [name] Other Clinician: Referring Lisandra Mathisen: Treating Grady Mohabir/Extender: [name]: 1 Wound Status Wound Number:  12 Primary Venous Leg Ulcer Etiology: Wound Location: Right, Proximal, Anterior Lower Leg Wound Healed - Epithelialized Wounding Event: Blister Status: Date Acquired: 02/16/2023 Comorbid Cataracts, Chronic sinus problems/congestion, Asthma, Sleep Weeks Of Hanson: 1 History: Apnea, Hypertension, Peripheral Venous Disease, Osteoarthritis, Clustered Wound: No Neuropathy [name], [name] ([id number]) [id number] Page 9 of 11 Photos Wound Measurements Length: (cm) Width: (cm) Depth: (cm) Area: (cm) Volume: (cm) 0 % Reduction in Area: 100% 0 % Reduction in Volume: 100% 0 Epithelialization: None 0 Tunneling: No 0 Undermining: No Wound Description Classification: Full Thickness Without Exposed Suppor Exudate Amount: Medium Exudate Type: Serosanguineous Exudate Color: red, brown t Structures Foul Odor After Cleansing: No Slough/Fibrino Yes Wound Bed Granulation Amount: Large (67-100%) Exposed Structure Granulation Quality: Pink Fascia Exposed: No Necrotic Amount: None Present (0%) Fat Layer (Subcutaneous Tissue) Exposed: Yes Tendon Exposed: No Muscle Exposed: No Joint Exposed: No Bone Exposed: No Periwound Skin Texture Texture Color No Abnormalities Noted: No No Abnormalities Noted: No Callus: No Atrophie Blanche: No Crepitus: No Cyanosis: No Excoriation: No Ecchymosis: No Induration: No Erythema: No Rash: No Hemosiderin Staining: No Scarring: No Mottled: No Pallor: No Moisture Rubor: No No Abnormalities Noted: No Dry / Scaly: No Maceration: No Hanson Notes Wound #12 (Lower Leg) Wound Laterality: Right, Anterior, Proximal Cleanser Peri-Wound Care Topical Primary Dressing Secondary Dressing Secured With Compression Wrap Compression Stockings Add-Ons Electronic Signature(s) Signed: 03/09/2023 3:16:56 PM By: [name] Mu RN Entered By: [name] on 03/09/2023 13:51:59 [name] ([id number])  [id number] Page 10 of 11 -------------------------------------------------------------------------------- Wound Assessment Details Patient Name: [name]: [name], [name] [name] 03/09/2023 1:30 PM Medical Record Number: [id number] Patient Account Number: [id number] Date of Birth/Sex: Treating RN: [birth year] Hanson (82 y.o. M) Primary Care Xzavian Semmel: [name] Other Clinician: Referring Alyzah Pelly: Treating Shivam Mestas/Extender: [name]: 1 Wound Status Wound Number: 13 Primary Venous Leg Ulcer Etiology: Wound Location: Right, Distal, Anterior Lower Leg Wound Open Wounding Event: Blister Status: Date Acquired: 02/16/2023 Comorbid Cataracts, Chronic sinus problems/congestion, Asthma, Sleep Weeks Of Hanson: 1 History: Apnea, Hypertension, Peripheral Venous Disease, Osteoarthritis, Clustered Wound: No Neuropathy Photos Wound Measurements Length: (cm) 0.5 Width: (cm) 0.3 Depth: (cm) 0.1 Area: (cm) 0.118 Volume: (cm) 0.012 % Reduction in Area: 81.2% % Reduction in Volume: 81% Epithelialization: None Tunneling: No Undermining: No Wound Description Classification: Full Thickness Without Exposed Suppor Exudate Amount: Medium Exudate Type: Serosanguineous Exudate Color: red, brown t Structures Foul Odor After Cleansing: No Slough/Fibrino Yes Wound Bed Granulation Amount: Small (1-33%) Exposed Structure Granulation Quality: Red, Pink Fascia Exposed: No Necrotic Amount: Large (67-100%) Fat Layer (Subcutaneous Tissue) Exposed: Yes Necrotic Quality: Adherent Slough Tendon Exposed: No Muscle Exposed: No Joint Exposed: No Bone Exposed: No Periwound Skin Texture Texture Color No Abnormalities Noted: No No Abnormalities Noted: No Callus: No Atrophie Blanche: No Crepitus: No Cyanosis: No Excoriation: No Ecchymosis: No Induration: No Erythema: No Rash: No Hemosiderin Staining: No Scarring:  No Mottled: No Pallor: No Moisture Rubor: No No Abnormalities Noted: No Dry / Scaly: Yes Maceration: No Hanson Notes  Wound #13 (Lower Leg) Wound Laterality: Right, Anterior, Distal [name], [name] ([id number]) [id number] Page 11 of 11 Cleanser Soap and Water Discharge Instruction: [name] shower and wash wound with dial antibacterial soap and water prior to dressing change. Vashe 5.8 (oz) Discharge Instruction: Cleanse the wound with Vashe prior to applying a clean dressing using gauze sponges, not tissue or cotton balls. Peri-Wound Care Sween Lotion (Moisturizing lotion) Discharge Instruction: Apply moisturizing lotion as directed Topical Gentamicin Discharge Instruction: As directed by physician Mupirocin Ointment Discharge Instruction: Apply Mupirocin (Bactroban) as instructed Primary Dressing Maxorb Extra Ag+ Alginate Dressing, 2x2 (in/in) Discharge Instruction: Apply to wound bed as instructed Secondary Dressing ABD Pad, 8x10 Discharge Instruction: Apply over primary dressing as directed. Secured With Compression Wrap Urgo K2 Lite, two layer compression system, regular Discharge Instruction: Apply Urgo K2 Lite as directed (alternative to 3 layer compression). Compression Stockings Add-Ons Electronic Signature(s) Signed: 03/11/2023 4:40:31 PM By: [name] Entered By: [name] on 03/09/2023 13:16:55 -------------------------------------------------------------------------------- Vitals Details Patient Name: [name]: [name] [name] 03/09/2023 1:30 PM Medical Record Number: [id number] Patient Account Number: [id number] Date of Birth/Sex: Treating RN: [birth year] Hanson (82 y.o. M) Primary Care Nanea Jared: [name] Other Clinician: Referring Desmond Szabo: Treating Karyna Bessler/Extender: [name]: 1 Vital Signs Time Taken: 13:00 Temperature (F): 98.1 Height (in): 70 Pulse (bpm):  77 Weight (lbs): 250 Respiratory Rate (breaths/min): 20 Body Mass Index (BMI): 35.9 Blood Pressure (mmHg): 143/80 Reference Range: 80 - 120 mg / dl Electronic Signature(s) Signed: 03/11/2023 4:40:31 PM By: [name] Entered By: [name] on 03/09/2023 13:02:08

## 2023-03-13 ENCOUNTER — Other Ambulatory Visit: Payer: Self-pay | Admitting: Family Medicine

## 2023-03-13 ENCOUNTER — Ambulatory Visit (HOSPITAL_BASED_OUTPATIENT_CLINIC_OR_DEPARTMENT_OTHER): Payer: Medicare Other

## 2023-03-13 DIAGNOSIS — R600 Localized edema: Secondary | ICD-10-CM

## 2023-03-13 DIAGNOSIS — R110 Nausea: Secondary | ICD-10-CM

## 2023-03-13 DIAGNOSIS — R140 Abdominal distension (gaseous): Secondary | ICD-10-CM

## 2023-03-16 ENCOUNTER — Encounter (HOSPITAL_BASED_OUTPATIENT_CLINIC_OR_DEPARTMENT_OTHER): Payer: Medicare Other | Admitting: Internal Medicine

## 2023-03-16 DIAGNOSIS — I83813 Varicose veins of bilateral lower extremities with pain: Secondary | ICD-10-CM | POA: Diagnosis not present

## 2023-03-16 DIAGNOSIS — I890 Lymphedema, not elsewhere classified: Secondary | ICD-10-CM | POA: Diagnosis not present

## 2023-03-16 DIAGNOSIS — R600 Localized edema: Secondary | ICD-10-CM | POA: Diagnosis not present

## 2023-03-16 DIAGNOSIS — I872 Venous insufficiency (chronic) (peripheral): Secondary | ICD-10-CM | POA: Diagnosis not present

## 2023-03-16 DIAGNOSIS — M7989 Other specified soft tissue disorders: Secondary | ICD-10-CM | POA: Diagnosis not present

## 2023-03-18 ENCOUNTER — Ambulatory Visit
Admission: RE | Admit: 2023-03-18 | Discharge: 2023-03-18 | Disposition: A | Discharge status: Home / Self Care | Payer: Medicare Other | Source: Ambulatory Visit | Attending: Family Medicine | Admitting: Family Medicine

## 2023-03-18 ENCOUNTER — Ambulatory Visit: Payer: Medicare Other | Admitting: Student

## 2023-03-18 DIAGNOSIS — R110 Nausea: Secondary | ICD-10-CM | POA: Diagnosis not present

## 2023-03-18 DIAGNOSIS — R600 Localized edema: Secondary | ICD-10-CM | POA: Insufficient documentation

## 2023-03-18 DIAGNOSIS — R140 Abdominal distension (gaseous): Secondary | ICD-10-CM | POA: Diagnosis not present

## 2023-03-18 DIAGNOSIS — K5730 Diverticulosis of large intestine without perforation or abscess without bleeding: Secondary | ICD-10-CM | POA: Diagnosis not present

## 2023-03-18 DIAGNOSIS — N200 Calculus of kidney: Secondary | ICD-10-CM | POA: Diagnosis not present

## 2023-03-24 ENCOUNTER — Encounter (HOSPITAL_BASED_OUTPATIENT_CLINIC_OR_DEPARTMENT_OTHER): Payer: Medicare Other | Attending: Internal Medicine | Admitting: Internal Medicine

## 2023-03-24 DIAGNOSIS — I87313 Chronic venous hypertension (idiopathic) with ulcer of bilateral lower extremity: Secondary | ICD-10-CM | POA: Diagnosis not present

## 2023-03-24 DIAGNOSIS — I129 Hypertensive chronic kidney disease with stage 1 through stage 4 chronic kidney disease, or unspecified chronic kidney disease: Secondary | ICD-10-CM | POA: Diagnosis not present

## 2023-03-24 DIAGNOSIS — Z96642 Presence of left artificial hip joint: Secondary | ICD-10-CM | POA: Insufficient documentation

## 2023-03-24 DIAGNOSIS — R7303 Prediabetes: Secondary | ICD-10-CM | POA: Insufficient documentation

## 2023-03-24 DIAGNOSIS — Z89411 Acquired absence of right great toe: Secondary | ICD-10-CM | POA: Insufficient documentation

## 2023-03-24 DIAGNOSIS — L97822 Non-pressure chronic ulcer of other part of left lower leg with fat layer exposed: Secondary | ICD-10-CM | POA: Diagnosis not present

## 2023-03-24 DIAGNOSIS — L97812 Non-pressure chronic ulcer of other part of right lower leg with fat layer exposed: Secondary | ICD-10-CM | POA: Insufficient documentation

## 2023-03-24 DIAGNOSIS — N183 Chronic kidney disease, stage 3 unspecified: Secondary | ICD-10-CM | POA: Insufficient documentation

## 2023-04-07 ENCOUNTER — Emergency Department (HOSPITAL_COMMUNITY): Payer: Medicare Other

## 2023-04-07 ENCOUNTER — Telehealth: Payer: Self-pay | Admitting: Pulmonary Disease

## 2023-04-07 ENCOUNTER — Other Ambulatory Visit: Payer: Self-pay

## 2023-04-07 ENCOUNTER — Inpatient Hospital Stay (HOSPITAL_COMMUNITY)
Admission: EM | Admit: 2023-04-07 | Discharge: 2023-04-10 | DRG: 177 | Disposition: A | Discharge status: Home / Self Care | Payer: Medicare Other | Attending: Internal Medicine | Admitting: Internal Medicine

## 2023-04-07 DIAGNOSIS — I130 Hypertensive heart and chronic kidney disease with heart failure and stage 1 through stage 4 chronic kidney disease, or unspecified chronic kidney disease: Secondary | ICD-10-CM | POA: Diagnosis present

## 2023-04-07 DIAGNOSIS — N1832 Chronic kidney disease, stage 3b: Secondary | ICD-10-CM | POA: Diagnosis present

## 2023-04-07 DIAGNOSIS — I5033 Acute on chronic diastolic (congestive) heart failure: Secondary | ICD-10-CM | POA: Diagnosis not present

## 2023-04-07 DIAGNOSIS — G4733 Obstructive sleep apnea (adult) (pediatric): Secondary | ICD-10-CM | POA: Diagnosis present

## 2023-04-07 DIAGNOSIS — E876 Hypokalemia: Secondary | ICD-10-CM | POA: Diagnosis present

## 2023-04-07 DIAGNOSIS — R609 Edema, unspecified: Secondary | ICD-10-CM | POA: Diagnosis not present

## 2023-04-07 DIAGNOSIS — Z6841 Body Mass Index (BMI) 40.0 and over, adult: Secondary | ICD-10-CM | POA: Diagnosis not present

## 2023-04-07 DIAGNOSIS — J4531 Mild persistent asthma with (acute) exacerbation: Secondary | ICD-10-CM

## 2023-04-07 DIAGNOSIS — K449 Diaphragmatic hernia without obstruction or gangrene: Secondary | ICD-10-CM | POA: Diagnosis not present

## 2023-04-07 DIAGNOSIS — R0602 Shortness of breath: Principal | ICD-10-CM

## 2023-04-07 DIAGNOSIS — I10 Essential (primary) hypertension: Secondary | ICD-10-CM | POA: Diagnosis not present

## 2023-04-07 DIAGNOSIS — R059 Cough, unspecified: Secondary | ICD-10-CM | POA: Diagnosis not present

## 2023-04-07 DIAGNOSIS — R7303 Prediabetes: Secondary | ICD-10-CM | POA: Diagnosis present

## 2023-04-07 DIAGNOSIS — N401 Enlarged prostate with lower urinary tract symptoms: Secondary | ICD-10-CM | POA: Diagnosis present

## 2023-04-07 DIAGNOSIS — Z8249 Family history of ischemic heart disease and other diseases of the circulatory system: Secondary | ICD-10-CM | POA: Diagnosis not present

## 2023-04-07 DIAGNOSIS — J45901 Unspecified asthma with (acute) exacerbation: Secondary | ICD-10-CM | POA: Diagnosis present

## 2023-04-07 DIAGNOSIS — Z833 Family history of diabetes mellitus: Secondary | ICD-10-CM | POA: Diagnosis not present

## 2023-04-07 DIAGNOSIS — J9601 Acute respiratory failure with hypoxia: Secondary | ICD-10-CM | POA: Diagnosis not present

## 2023-04-07 DIAGNOSIS — M1990 Unspecified osteoarthritis, unspecified site: Secondary | ICD-10-CM | POA: Diagnosis present

## 2023-04-07 DIAGNOSIS — R1312 Dysphagia, oropharyngeal phase: Secondary | ICD-10-CM | POA: Diagnosis present

## 2023-04-07 DIAGNOSIS — F32A Depression, unspecified: Secondary | ICD-10-CM | POA: Diagnosis present

## 2023-04-07 DIAGNOSIS — J9809 Other diseases of bronchus, not elsewhere classified: Secondary | ICD-10-CM | POA: Diagnosis present

## 2023-04-07 DIAGNOSIS — Z96652 Presence of left artificial knee joint: Secondary | ICD-10-CM | POA: Diagnosis present

## 2023-04-07 DIAGNOSIS — J40 Bronchitis, not specified as acute or chronic: Secondary | ICD-10-CM

## 2023-04-07 DIAGNOSIS — J9621 Acute and chronic respiratory failure with hypoxia: Secondary | ICD-10-CM | POA: Diagnosis present

## 2023-04-07 DIAGNOSIS — J189 Pneumonia, unspecified organism: Secondary | ICD-10-CM

## 2023-04-07 DIAGNOSIS — R140 Abdominal distension (gaseous): Secondary | ICD-10-CM | POA: Diagnosis not present

## 2023-04-07 DIAGNOSIS — J690 Pneumonitis due to inhalation of food and vomit: Principal | ICD-10-CM | POA: Diagnosis present

## 2023-04-07 DIAGNOSIS — R0600 Dyspnea, unspecified: Secondary | ICD-10-CM | POA: Diagnosis not present

## 2023-04-07 DIAGNOSIS — E785 Hyperlipidemia, unspecified: Secondary | ICD-10-CM | POA: Diagnosis present

## 2023-04-07 DIAGNOSIS — Z87442 Personal history of urinary calculi: Secondary | ICD-10-CM

## 2023-04-07 DIAGNOSIS — Z8349 Family history of other endocrine, nutritional and metabolic diseases: Secondary | ICD-10-CM

## 2023-04-07 DIAGNOSIS — K224 Dyskinesia of esophagus: Secondary | ICD-10-CM | POA: Diagnosis not present

## 2023-04-07 DIAGNOSIS — I872 Venous insufficiency (chronic) (peripheral): Secondary | ICD-10-CM | POA: Diagnosis present

## 2023-04-07 DIAGNOSIS — G8929 Other chronic pain: Secondary | ICD-10-CM | POA: Diagnosis present

## 2023-04-07 DIAGNOSIS — J9620 Acute and chronic respiratory failure, unspecified whether with hypoxia or hypercapnia: Secondary | ICD-10-CM | POA: Diagnosis not present

## 2023-04-07 DIAGNOSIS — R069 Unspecified abnormalities of breathing: Secondary | ICD-10-CM | POA: Diagnosis not present

## 2023-04-07 DIAGNOSIS — K2100 Gastro-esophageal reflux disease with esophagitis, without bleeding: Secondary | ICD-10-CM | POA: Diagnosis present

## 2023-04-07 DIAGNOSIS — Z96642 Presence of left artificial hip joint: Secondary | ICD-10-CM | POA: Diagnosis present

## 2023-04-07 DIAGNOSIS — Z1152 Encounter for screening for COVID-19: Secondary | ICD-10-CM | POA: Diagnosis not present

## 2023-04-07 DIAGNOSIS — J398 Other specified diseases of upper respiratory tract: Secondary | ICD-10-CM | POA: Diagnosis present

## 2023-04-07 DIAGNOSIS — J45909 Unspecified asthma, uncomplicated: Secondary | ICD-10-CM | POA: Diagnosis present

## 2023-04-07 DIAGNOSIS — F419 Anxiety disorder, unspecified: Secondary | ICD-10-CM | POA: Diagnosis present

## 2023-04-07 DIAGNOSIS — Z89421 Acquired absence of other right toe(s): Secondary | ICD-10-CM

## 2023-04-07 DIAGNOSIS — Z79899 Other long term (current) drug therapy: Secondary | ICD-10-CM

## 2023-04-07 DIAGNOSIS — I700 Atherosclerosis of aorta: Secondary | ICD-10-CM | POA: Diagnosis not present

## 2023-04-07 DIAGNOSIS — Z7951 Long term (current) use of inhaled steroids: Secondary | ICD-10-CM

## 2023-04-07 LAB — COMPREHENSIVE METABOLIC PANEL
ALT: 27 U/L (ref 0–44)
AST: 29 U/L (ref 15–41)
Albumin: 3.9 g/dL (ref 3.5–5.0)
Alkaline Phosphatase: 79 U/L (ref 38–126)
Anion gap: 9 (ref 5–15)
BUN: 15 mg/dL (ref 8–23)
CO2: 29 mmol/L (ref 22–32)
Calcium: 8.5 mg/dL — ABNORMAL LOW (ref 8.9–10.3)
Chloride: 99 mmol/L (ref 98–111)
Creatinine, Ser: 1.82 mg/dL — ABNORMAL HIGH (ref 0.61–1.24)
GFR, Estimated: 37 mL/min — ABNORMAL LOW (ref >60)
Glucose, Bld: 112 mg/dL — ABNORMAL HIGH (ref 70–99)
Potassium: 3.2 mmol/L — ABNORMAL LOW (ref 3.5–5.1)
Sodium: 137 mmol/L (ref 135–145)
Total Bilirubin: 0.7 mg/dL (ref 0.3–1.2)
Total Protein: 7.7 g/dL (ref 6.5–8.1)

## 2023-04-07 LAB — CBC WITH DIFFERENTIAL/PLATELET
Abs Immature Granulocytes: 0.01 10*3/uL (ref 0.00–0.07)
Basophils Absolute: 0 10*3/uL (ref 0.0–0.1)
Basophils Relative: 0 %
Eosinophils Absolute: 0.2 10*3/uL (ref 0.0–0.5)
Eosinophils Relative: 3 %
HCT: 40.3 % (ref 39.0–52.0)
Hemoglobin: 13.1 g/dL (ref 13.0–17.0)
Immature Granulocytes: 0 %
Lymphocytes Relative: 19 %
Lymphs Abs: 1.1 10*3/uL (ref 0.7–4.0)
MCH: 28.9 pg (ref 26.0–34.0)
MCHC: 32.5 g/dL (ref 30.0–36.0)
MCV: 89 fL (ref 80.0–100.0)
Monocytes Absolute: 0.5 10*3/uL (ref 0.1–1.0)
Monocytes Relative: 9 %
Neutro Abs: 3.8 10*3/uL (ref 1.7–7.7)
Neutrophils Relative %: 69 %
Platelets: 200 10*3/uL (ref 150–400)
RBC: 4.53 MIL/uL (ref 4.22–5.81)
RDW: 14.1 % (ref 11.5–15.5)
WBC: 5.5 10*3/uL (ref 4.0–10.5)
nRBC: 0 % (ref 0.0–0.2)

## 2023-04-07 LAB — TROPONIN I (HIGH SENSITIVITY)
Troponin I (High Sensitivity): 9 ng/L (ref <18)
Troponin I (High Sensitivity): 10 ng/L (ref <18)

## 2023-04-07 LAB — BRAIN NATRIURETIC PEPTIDE: B Natriuretic Peptide: 13.7 pg/mL (ref 0.0–100.0)

## 2023-04-07 LAB — SARS CORONAVIRUS 2 BY RT PCR: SARS Coronavirus 2 by RT PCR: NEGATIVE

## 2023-04-07 MED ORDER — BUDESONIDE 0.25 MG/2ML IN SUSP
0.2500 mg | Freq: Two times a day (BID) | RESPIRATORY_TRACT | Status: DC
  Administered 2023-04-07 – 2023-04-10 (×6): 0.25 mg via RESPIRATORY_TRACT
  Filled 2023-04-07 (×6): qty 2

## 2023-04-07 MED ORDER — IPRATROPIUM-ALBUTEROL 0.5-2.5 (3) MG/3ML IN SOLN
3.0000 mL | Freq: Once | RESPIRATORY_TRACT | Status: AC
  Administered 2023-04-07: 3 mL via RESPIRATORY_TRACT
  Filled 2023-04-07: qty 3

## 2023-04-07 MED ORDER — ARFORMOTEROL TARTRATE 15 MCG/2ML IN NEBU
15.0000 ug | INHALATION_SOLUTION | Freq: Two times a day (BID) | RESPIRATORY_TRACT | Status: DC
  Administered 2023-04-07 – 2023-04-10 (×6): 15 ug via RESPIRATORY_TRACT
  Filled 2023-04-07 (×6): qty 2

## 2023-04-07 MED ORDER — GABAPENTIN 300 MG PO CAPS
600.0000 mg | ORAL_CAPSULE | Freq: Every day | ORAL | Status: DC
  Administered 2023-04-07 – 2023-04-09 (×3): 600 mg via ORAL
  Filled 2023-04-07 (×3): qty 2

## 2023-04-07 MED ORDER — ONDANSETRON HCL 4 MG PO TABS: 4.0000 mg | ORAL_TABLET | Freq: Four times a day (QID) | ORAL | Status: DC | PRN

## 2023-04-07 MED ORDER — DOXAZOSIN MESYLATE 1 MG PO TABS
8.0000 mg | ORAL_TABLET | Freq: Every day | ORAL | Status: DC
  Administered 2023-04-08 – 2023-04-09 (×3): 8 mg via ORAL
  Filled 2023-04-07 (×3): qty 8

## 2023-04-07 MED ORDER — IOHEXOL 350 MG/ML SOLN
75.0000 mL | Freq: Once | INTRAVENOUS | Status: AC | PRN
  Administered 2023-04-07: 75 mL via INTRAVENOUS

## 2023-04-07 MED ORDER — ONDANSETRON HCL 4 MG/2ML IJ SOLN: 4.0000 mg | Freq: Four times a day (QID) | INTRAMUSCULAR | Status: DC | PRN

## 2023-04-07 MED ORDER — SODIUM CHLORIDE 0.9 % IV SOLN
3.0000 g | Freq: Four times a day (QID) | INTRAVENOUS | Status: DC
  Administered 2023-04-07 – 2023-04-09 (×6): 3 g via INTRAVENOUS
  Filled 2023-04-07 (×7): qty 8

## 2023-04-07 MED ORDER — ALBUTEROL SULFATE (2.5 MG/3ML) 0.083% IN NEBU: 2.5000 mg | INHALATION_SOLUTION | RESPIRATORY_TRACT | Status: DC | PRN

## 2023-04-07 MED ORDER — SODIUM CHLORIDE 0.9 % IV SOLN
500.0000 mg | Freq: Once | INTRAVENOUS | Status: AC
  Administered 2023-04-07: 500 mg via INTRAVENOUS
  Filled 2023-04-07: qty 5

## 2023-04-07 MED ORDER — POTASSIUM CHLORIDE 20 MEQ PO PACK
40.0000 meq | PACK | Freq: Once | ORAL | Status: AC
  Administered 2023-04-07: 40 meq via ORAL
  Filled 2023-04-07: qty 2

## 2023-04-07 MED ORDER — METHYLPREDNISOLONE SODIUM SUCC 40 MG IJ SOLR
40.0000 mg | Freq: Two times a day (BID) | INTRAMUSCULAR | Status: DC
  Administered 2023-04-07 – 2023-04-08 (×2): 40 mg via INTRAVENOUS
  Filled 2023-04-07 (×2): qty 1

## 2023-04-07 MED ORDER — ENOXAPARIN SODIUM 40 MG/0.4ML IJ SOSY
40.0000 mg | PREFILLED_SYRINGE | INTRAMUSCULAR | Status: DC
  Administered 2023-04-08 – 2023-04-10 (×3): 40 mg via SUBCUTANEOUS
  Filled 2023-04-07 (×3): qty 0.4

## 2023-04-07 MED ORDER — GUAIFENESIN ER 600 MG PO TB12
1200.0000 mg | ORAL_TABLET | Freq: Two times a day (BID) | ORAL | Status: DC
  Administered 2023-04-07 – 2023-04-10 (×6): 1200 mg via ORAL
  Filled 2023-04-07 (×6): qty 2

## 2023-04-07 MED ORDER — HYDROCODONE-ACETAMINOPHEN 5-325 MG PO TABS
1.0000 | ORAL_TABLET | ORAL | Status: DC | PRN
  Administered 2023-04-08: 2 via ORAL
  Administered 2023-04-09: 1 via ORAL
  Administered 2023-04-10: 2 via ORAL
  Filled 2023-04-07 (×3): qty 2

## 2023-04-07 MED ORDER — SODIUM CHLORIDE 0.9 % IV SOLN
1.0000 g | Freq: Once | INTRAVENOUS | Status: AC
  Administered 2023-04-07: 1 g via INTRAVENOUS
  Filled 2023-04-07: qty 10

## 2023-04-07 MED ORDER — MONTELUKAST SODIUM 10 MG PO TABS
10.0000 mg | ORAL_TABLET | Freq: Every day | ORAL | Status: DC
  Administered 2023-04-07 – 2023-04-09 (×3): 10 mg via ORAL
  Filled 2023-04-07 (×3): qty 1

## 2023-04-07 MED ORDER — ROSUVASTATIN CALCIUM 5 MG PO TABS
5.0000 mg | ORAL_TABLET | Freq: Every day | ORAL | Status: DC
  Administered 2023-04-07 – 2023-04-09 (×3): 5 mg via ORAL
  Filled 2023-04-07 (×3): qty 1

## 2023-04-07 MED ORDER — SODIUM CHLORIDE 3 % IN NEBU
4.0000 mL | INHALATION_SOLUTION | Freq: Two times a day (BID) | RESPIRATORY_TRACT | Status: DC
  Administered 2023-04-08: 4 mL via RESPIRATORY_TRACT
  Filled 2023-04-07 (×2): qty 4

## 2023-04-07 MED ORDER — PANTOPRAZOLE SODIUM 40 MG IV SOLR
40.0000 mg | Freq: Two times a day (BID) | INTRAVENOUS | Status: DC
  Administered 2023-04-07 – 2023-04-09 (×5): 40 mg via INTRAVENOUS
  Filled 2023-04-07 (×5): qty 10

## 2023-04-07 MED ORDER — ACETAMINOPHEN 650 MG RE SUPP: 650.0000 mg | Freq: Four times a day (QID) | RECTAL | Status: DC | PRN

## 2023-04-07 MED ORDER — ACETAMINOPHEN 325 MG PO TABS: 650.0000 mg | ORAL_TABLET | Freq: Four times a day (QID) | ORAL | Status: DC | PRN

## 2023-04-07 MED ORDER — SENNOSIDES-DOCUSATE SODIUM 8.6-50 MG PO TABS: 1.0000 | ORAL_TABLET | Freq: Every evening | ORAL | Status: DC | PRN

## 2023-04-07 MED ORDER — FUROSEMIDE 40 MG PO TABS
40.0000 mg | ORAL_TABLET | Freq: Every day | ORAL | Status: DC
  Administered 2023-04-08: 40 mg via ORAL
  Filled 2023-04-07: qty 1

## 2023-04-07 NOTE — ED Notes (Signed)
ED TO INPATIENT HANDOFF REPORT  ED Nurse Name and Phone #:  Edyth Gunnels RN [phone]  S Name/Age/Gender [name] 82 y.o. male Room/Bed: WA17/WA17  Code Status   Code Status: Prior  Home/SNF/Other Home Patient oriented to: self, place, time, and situation Is this baseline? Yes   Triage Complete: Triage complete  Chief Complaint Acute respiratory failure with hypoxia (HCC) [J96.01]  Triage Note EMS reports from home c/o increasing SOB since last week. Pt states he has ben having coughing fits and productive cough with green and brown mucus. Hx of asthma.  BP 160/90 HR 82 RR 24 Sp02 93 RA (98 on 3Ltrs 02) CBG 128  18ga LAC   Allergies No Known Allergies  Level of Care/Admitting Diagnosis ED Disposition     ED Disposition  Admit   Condition  --   Comment  Hospital Area: [facility] [[id number]]  Level of Care: Med-Surg [16]  May place patient in observation at [facility] or [facility] if equivalent level of care is available:: No  Covid Evaluation: Confirmed COVID Negative  Diagnosis: Acute respiratory failure with hypoxia [facility]) [[id number]]  Admitting Physician: Charlsie Quest [[id number]]  Attending Physician: Charlsie Quest [[id number]]          B Medical/Surgery History 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   Hyperlipidemia    Joint pain    Neuromuscular disorder (HCC)    Pre-diabetes     Prediabetes    Sleep apnea    uses CPAP nightly   SOB (shortness of breath)    Swallowing difficulty    Swelling of lower extremity    right more than leg leg   Umbilical hernia    Past Surgical History:  Procedure Laterality Date   ABDOMINAL AORTOGRAM W/LOWER EXTREMITY N/A 03/14/2020   Procedure: ABDOMINAL AORTOGRAM W/LOWER EXTREMITY;  Surgeon: Nada Libman, MD;  Location: [facility] INVASIVE [facility];  Service: Vascular;  Laterality: N/A;   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   AMPUTATION TOE Right 04/18/2022   Procedure: AMPUTATION RIGHT GREAT TOE;  Surgeon: Jodi Geralds, MD;  Location: [facility] ORS;  Service: Orthopedics;  Laterality: Right;   BACK SURGERY  2003   L4, L5   BACK SURGERY     CATARACT EXTRACTION, BILATERAL     ENDOVENOUS ABLATION SAPHENOUS VEIN W/ LASER Right 06-01-2014   EVLA right greater saphenous vein by Gretta Began MD    epidural steroid injections        [facility]   hernia repair  2003   HERNIA REPAIR     hernia inguinal x 2   TOTAL HIP ARTHROPLASTY Left 05/05/2022   Procedure: LEFT TOTAL HIP ARTHROPLASTY ANTERIOR APPROACH;  Surgeon:  Jodi Geralds, MD;  Location: [facility] ORS;  Service: Orthopedics;  Laterality: Left;   TOTAL KNEE ARTHROPLASTY Left 03/22/2015   Procedure: TOTAL KNEE ARTHROPLASTY;  Surgeon: Cammy Copa, MD;  Location: [facility] OR;  Service: Orthopedics;  Laterality: Left;   UMBILICAL HERNIA REPAIR N/A 06/27/2016   Procedure: LAPAROSCOPIC ASSISTED REPAIR OF UMBILICAL HERINA WITH MESH;  Surgeon: Luretha Murphy, MD;  Location: [facility] ORS;  Service: General;  Laterality: N/A;   VASCULAR SURGERY  2015   right leg     A IV Location/Drains/Wounds Patient Lines/Drains/Airways Status     Active Line/Drains/Airways     Name Placement date Placement time Site Days   Peripheral IV 04/07/23 18 G Anterior;Distal;Left;Upper Arm 04/07/23  1750  Arm  less than 1            Intake/Output Last 24 hours No intake or output data in the  24 hours ending 04/07/23 2148  Labs/Imaging Results for orders placed or performed during the hospital encounter of 04/07/23 (from the past 48 hour(s))  Comprehensive metabolic panel     Status: Abnormal   Collection Time: 04/07/23  6:15 PM  Result Value Ref Range   Sodium 137 135 - 145 mmol/L   Potassium 3.2 (L) 3.5 - 5.1 mmol/L   Chloride 99 98 - 111 mmol/L   CO2 29 22 - 32 mmol/L   Glucose, Bld 112 (H) 70 - 99 mg/dL    Comment: Glucose reference range applies only to samples taken after fasting for at least 8 hours.   BUN 15 8 - 23 mg/dL   Creatinine, Ser 1.61 (H) 0.61 - 1.24 mg/dL   Calcium 8.5 (L) 8.9 - 10.3 mg/dL   Total Protein 7.7 6.5 - 8.1 g/dL   Albumin 3.9 3.5 - 5.0 g/dL   AST 29 15 - 41 U/L   ALT 27 0 - 44 U/L   Alkaline Phosphatase 79 38 - 126 U/L   Total Bilirubin 0.7 0.3 - 1.2 mg/dL   GFR, Estimated 37 (L) >60 mL/min    Comment: (NOTE) Calculated using the CKD-EPI Creatinine Equation (2021)    Anion gap 9 5 - 15    Comment: Performed at [facility], [address] [place], [place] [address]  Troponin I (High Sensitivity)     Status: None   Collection Time: 04/07/23  6:15 PM  Result Value Ref Range   Troponin I (High Sensitivity) 9 <18 ng/L    Comment: (NOTE) Elevated high sensitivity troponin I (hsTnI) values and significant  changes across serial measurements may suggest ACS but many other  chronic and acute conditions are known to elevate hsTnI results.  Refer to the "Links" section for chest pain algorithms and additional  guidance. Performed at [facility], [address], [place], [place] [address]   CBC with Differential     Status: None   Collection Time: 04/07/23  6:15 PM  Result Value Ref Range   WBC 5.5 4.0 - 10.5 K/uL   RBC 4.53 4.22 - 5.81 MIL/uL   Hemoglobin 13.1 13.0 - 17.0 g/dL   HCT 11.9 14.7 - 82.9 %   MCV 89.0 80.0 - 100.0 fL   MCH 28.9 26.0 - 34.0 pg   MCHC 32.5 30.0 - 36.0 g/dL   RDW 56.2  13.0 - 86.5 %   Platelets 200 150 - 400 K/uL   nRBC 0.0 0.0 - 0.2 %   Neutrophils Relative % 69 %   Neutro Abs 3.8 1.7 -  7.7 K/uL   Lymphocytes Relative 19 %   Lymphs Abs 1.1 0.7 - 4.0 K/uL   Monocytes Relative 9 %   Monocytes Absolute 0.5 0.1 - 1.0 K/uL   Eosinophils Relative 3 %   Eosinophils Absolute 0.2 0.0 - 0.5 K/uL   Basophils Relative 0 %   Basophils Absolute 0.0 0.0 - 0.1 K/uL   Immature Granulocytes 0 %   Abs Immature Granulocytes 0.01 0.00 - 0.07 K/uL    Comment: Performed at [facility], [address], [place], [place] [address]  Brain natriuretic peptide     Status: None   Collection Time: 04/07/23  6:15 PM  Result Value Ref Range   B Natriuretic Peptide 13.7 0.0 - 100.0 pg/mL    Comment: Performed at [facility], [address], [place], [place] [address]  SARS Coronavirus 2 by RT PCR (hospital order, performed in [facility] hospital lab) *cepheid single result test* Anterior Nasal Swab     Status: None   Collection Time: 04/07/23  6:15 PM   Specimen: Anterior Nasal Swab  Result Value Ref Range   SARS Coronavirus 2 by RT PCR NEGATIVE NEGATIVE    Comment: (NOTE) SARS-CoV-2 target nucleic acids are NOT DETECTED.  The SARS-CoV-2 RNA is generally detectable in upper and lower respiratory specimens during the acute phase of infection. The lowest concentration of SARS-CoV-2 viral copies this assay can detect is 250 copies / mL. A negative result does not preclude SARS-CoV-2 infection and should not be used as the sole basis for treatment or other patient management decisions.  A negative result may occur with improper specimen collection / handling, submission of specimen other than nasopharyngeal swab, presence of viral mutation(s) within the areas targeted by this assay, and inadequate number of viral copies (<250 copies / mL). A negative result must be combined with clinical observations, patient history, and epidemiological  information.  Fact Sheet for Patients:   RoadLapTop.co.za  Fact Sheet for Healthcare Providers: http://kim-miller.com/  This test is not yet approved or  cleared by the [place] FDA and has been authorized for detection and/or diagnosis of SARS-CoV-2 by FDA under an Emergency Use Authorization (EUA).  This EUA will remain in effect (meaning this test can be used) for the duration of the COVID-19 declaration under Section 564(b)(1) of the Act, 21 U.S.C. section 360bbb-3(b)(1), unless the authorization is terminated or revoked sooner.  Performed at [facility], [address], [place], [place] [address]    *Note: Due to a large number of results and/or encounters for the requested time period, some results have not been displayed. A complete set of results can be found in Results Review.   CT Angio Chest PE W and/or Wo Contrast  Result Date: 04/07/2023 CLINICAL DATA:  Pulmonary embolism (PE) suspected, high prob EXAM: CT ANGIOGRAPHY CHEST WITH CONTRAST TECHNIQUE: Multidetector CT imaging of the chest was performed using the standard protocol during bolus administration of intravenous contrast. Multiplanar CT image reconstructions and MIPs were obtained to evaluate the vascular anatomy. RADIATION DOSE REDUCTION: This exam was performed according to the departmental dose-optimization program which includes automated exposure control, adjustment of the mA and/or kV according to patient size and/or use of iterative reconstruction technique. CONTRAST:  75mL OMNIPAQUE IOHEXOL 350 MG/ML SOLN COMPARISON:  01/14/2023 FINDINGS: Cardiovascular: SVC patent. Heart size normal. No pericardial effusion. The RV is nondilated. Satisfactory opacification of pulmonary arteries noted, and there is no evidence of pulmonary emboli. 3-vessel coronary calcifications.  Adequate contrast opacification of the thoracic aorta with no evidence of  dissection, aneurysm, or stenosis. There is bovine variant brachiocephalic arch anatomy without proximal stenosis. Scattered calcified atheromatous plaque in the descending thoracic aorta. Mediastinum/Nodes: There is narrowing of the distal trachea and proximal mainstem bronchi, increased since previous. No mediastinal hematoma or pathologic adenopathy. Lungs/Pleura: Debris in central airways of the right lower lobe and left lower lobe, with some patchy bilateral lower lobe peribronchial airspace opacities and inferior airspace consolidation, increased since most recent prior scan. Upper Abdomen: Bilateral urolithiasis largest visualized stone 1.1 cm mid left kidney, without hydronephrosis. No acute findings. Musculoskeletal: Multilevel spondylitic changes throughout the visualized lower cervical and thoracic spine, with a mild thoracic dextroscoliosis. Advanced left shoulder DJD. Review of the MIP images confirms the above findings. IMPRESSION: 1. Negative for acute PE or thoracic aortic dissection. 2. Debris in the central airways of the right lower lobe and left lower lobe, with patchy bilateral lower lobe peribronchial airspace opacities and inferior airspace consolidation, increased since most recent prior scan. 3. 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 4. Bilateral urolithiasis. 5. Coronary and aortic Atherosclerosis (ICD10-I70.0). Electronically Signed   By: Corlis Leak M.D.   On: 04/07/2023 20:33   DG Chest Portable 1 View  Result Date: 04/07/2023 CLINICAL DATA:  Cough, dyspnea EXAM: PORTABLE CHEST 1 VIEW COMPARISON:  None Available. FINDINGS: The heart size and mediastinal contours are within normal limits. Both lungs are clear. The visualized skeletal structures are unremarkable. IMPRESSION: No active disease. Electronically Signed   By: Helyn Numbers M.D.   On: 04/07/2023 18:58    Pending Labs Unresulted Labs (From admission,  onward)    None       Vitals/Pain Today's Vitals   04/07/23 1955 04/07/23 2030 04/07/23 2100 04/07/23 2140  BP: (!) 144/71 (!) 118/97 (!) 167/80   Pulse: 75 (!) 119 75   Resp: (!) 22  (!) 22   Temp:    97.7 F (36.5 C)  TempSrc:    Oral  SpO2: 95% 100% 96%   PainSc:        Isolation Precautions Airborne and Contact precautions  Medications Medications  azithromycin (ZITHROMAX) 500 mg in sodium chloride 0.9 % 250 mL IVPB (500 mg Intravenous New Bag/Given 04/07/23 2102)  ipratropium-albuterol (DUONEB) 0.5-2.5 (3) MG/3ML nebulizer solution 3 mL (3 mLs Nebulization Given 04/07/23 1817)  iohexol (OMNIPAQUE) 350 MG/ML injection 75 mL (75 mLs Intravenous Contrast Given 04/07/23 2001)  cefTRIAXone (ROCEPHIN) 1 g in sodium chloride 0.9 % 100 mL IVPB (1 g Intravenous New Bag/Given 04/07/23 2102)    Mobility walks     Focused Assessments Pulmonary Assessment Handoff:  Lung sounds: Bilateral Breath Sounds: Rhonchi, Expiratory wheezes O2 Device: Nasal Cannula O2 Flow Rate (L/min): 1.5 L/min    R Recommendations: See Admitting Provider Note  Report given to:   Additional Notes:

## 2023-04-07 NOTE — Hospital Course (Signed)
[name] is a 82 y.o. male with medical history significant for asthma, CKD stage IIIb, HTN, HLD, BPH, OSA on CPAP who is admitted with acute hypoxic respiratory failure due to aspiration pneumonia, asthma, and severe tracheobronchomalacia.

## 2023-04-07 NOTE — H&P (Addendum)
History and Physical    [name] YNW:[id number] DOB: [birth year] DOA: 04/07/2023  PCP: [name] Dimitri, MD  Patient coming from: Home  I have personally briefly reviewed patient's old medical records in [facility]  Chief Complaint: Shortness of breath, cough  HPI: [name] is a 82 y.o. male with medical history significant for asthma, CKD stage IIIb, HTN, HLD, BPH, OSA on CPAP who presented to the ED for evaluation of shortness of breath and cough.  Patient reports several days of progressive shortness of breath with cough productive of green/yellow sputum.  Dyspnea is worse when he is laying flat.  He has to have his head elevated at baseline when he sleeps.  He reports adherence to CPAP when sleeping.  He has been having significant heartburn/reflux symptoms.  He has been having intermittent episodes of choking sensation and feeling of taking mucus content into his lungs.  Patient initially went to pulmonology clinic.  He was found to be hypoxic with SpO2 84% on room air.  He was subsequently sent to the ED for further evaluation.  ED Course  Labs/Imaging on admission: I have personally reviewed following labs and imaging studies.  Initial vitals showed BP 157/138, pulse 80, RR 24, temp 98.1 F, SpO2 91% on room air, 98% on 3 L via [place].  Labs show WBC 5.5, hemoglobin 13.1, platelets 200,000, sodium 137, potassium 3.2, bicarb 29, BUN 15, creatinine 122, serum glucose 112, LFTs within normal limits, BNP 13.7, troponin 9.  SARS-CoV-2 PCR is negative.  CTA chest negative for PE or thoracic aortic dissection.  Debris in the central airways of the right lower and left lower lobes noted with patchy bilateral lower lobe peribronchial airspace opacities and inferior airspace consolidation.  This is increased compared to her most recent previous scan.  Significant narrowing of the distal trachea and proximal mainstem bronchi consistent with tracheobronchomalacia noted.  Patient  was given DuoNeb treatment, IV ceftriaxone and azithromycin.  The hospitalist service was consulted to admit for further evaluation and management.  Review of Systems: All systems reviewed and are negative except as documented in history of present illness above.   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   Hyperlipidemia    Joint pain    Neuromuscular disorder (HCC)    Pre-diabetes    Prediabetes    Sleep apnea    uses CPAP nightly   SOB (shortness of breath)    Swallowing difficulty    Swelling of lower extremity    right more than leg leg   Umbilical hernia     Past Surgical History:  Procedure Laterality Date   ABDOMINAL AORTOGRAM W/LOWER EXTREMITY N/A 03/14/2020   Procedure: ABDOMINAL AORTOGRAM W/LOWER EXTREMITY;  Surgeon: Nada Libman, MD;  Location:  [facility];  Service: Vascular;  Laterality: N/A;   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   AMPUTATION TOE Right 04/18/2022   Procedure: AMPUTATION RIGHT GREAT TOE;  Surgeon: Jodi Geralds, MD;  Location: [facility];  Service: Orthopedics;  Laterality: Right;   BACK SURGERY  2003   L4, L5   BACK SURGERY     CATARACT EXTRACTION, BILATERAL     ENDOVENOUS ABLATION SAPHENOUS VEIN W/ LASER Right 06-01-2014   EVLA right greater saphenous vein by Gretta Began MD    epidural steroid injections        [facility]   hernia repair  2003   HERNIA  REPAIR     hernia inguinal x 2   TOTAL HIP ARTHROPLASTY Left 05/05/2022   Procedure: LEFT TOTAL HIP ARTHROPLASTY ANTERIOR APPROACH;  Surgeon: Jodi Geralds, MD;  Location: [facility];  Service: Orthopedics;  Laterality: Left;   TOTAL KNEE ARTHROPLASTY Left 03/22/2015   Procedure: TOTAL KNEE ARTHROPLASTY;  Surgeon: Cammy Copa, MD;  [facility];  Service: Orthopedics;  Laterality: Left;   UMBILICAL HERNIA REPAIR N/A 06/27/2016   Procedure: LAPAROSCOPIC ASSISTED REPAIR OF UMBILICAL HERINA WITH MESH;  Surgeon: Luretha Murphy, MD;  Location: [facility];  Service: General;  Laterality: N/A;   VASCULAR SURGERY  2015   right leg    Social History:  reports that he has never smoked. He has never used smokeless tobacco. He reports current alcohol use. He reports that he does not use drugs.  No Known Allergies  Family History  Problem Relation Age of Onset   Thyroid disease Mother    Heart disease Father    Hypertension Father    Diabetes Maternal Grandfather    Colon cancer Neg Hx    Esophageal cancer Neg Hx      Prior to Admission medications   Medication Sig Start Date End Date Taking? Authorizing Provider  albuterol (PROVENTIL) (2.5 MG/3ML) 0.083% nebulizer solution Take 3 mLs (2.5 mg total) by nebulization every 4 (four) hours as needed for wheezing or shortness of breath. 11/23/22   Danford, Earl Lites, MD  albuterol (VENTOLIN HFA) 108 (90 Base) MCG/ACT inhaler Inhale 2 puffs into the lungs every 6 (six) hours as needed for wheezing or shortness of breath. 02/03/22   Cobb, Ruby Cola, NP  amoxicillin-clavulanate (AUGMENTIN) 875-125 MG tablet Take 1 tablet by mouth 2 (two) times daily. 01/05/23   Wallis Bamberg, PA-C  ASTEPRO 205.5 MCG/SPRAY SOLN Place 2 sprays into both nostrils in the morning.    [provider]  azithromycin (ZITHROMAX) 250 MG tablet Day 1: take 2 tablets. Day 2-5: Take 1 tablet daily. 01/05/23   Wallis Bamberg, PA-C  benzonatate (TESSALON) 100 MG capsule Take 100  mg by mouth 3 (three) times daily as needed for cough.    [provider]  Budeson-Glycopyrrol-Formoterol (BREZTRI AEROSPHERE) 160-9-4.8 MCG/ACT AERO Inhale 2 puffs into the lungs in the morning and at bedtime.    [provider]  buPROPion (WELLBUTRIN XL) 150 MG 24 hr tablet Take 450 mg by mouth every morning.    [provider]  CALCIUM PO Take 1 tablet by mouth every morning.    [provider]  docusate sodium (COLACE) 100 MG capsule Take 1 capsule (100 mg total) by mouth 2 (two) times daily. Patient taking differently: Take 100 mg by mouth 2 (two) times daily as needed for mild constipation. 04/23/22   Marshia Ly, PA-C  doxazosin (CARDURA) 8 MG tablet TAKE 1 TABLET BY MOUTH DAILY  . Take at night Patient taking differently: Take 8 mg by mouth at bedtime. 03/11/16   [name], MD  esomeprazole (NEXIUM 24HR) 20 MG capsule Take 20 mg by mouth See admin instructions. Take 20 mg by mouth in the morning 30 minutes before breakfast and an additional 20 mg once a day as needed for continued heartburn    [provider]  fluticasone (FLONASE ALLERGY RELIEF) 50 MCG/ACT nasal spray Place 2 sprays into both nostrils every evening.    [provider]  furosemide (LASIX) 40 MG tablet Take 1 tablet (40 mg total) by mouth daily. Take 40 mg by mouth in the morning and an additional 40 mg once a day as needed for unresolved swelling or fluid retention 12/17/22   Runell Gess, MD  gabapentin (NEURONTIN) 300 MG capsule TAKE 1 TO 2 CAPSULES BY MOUTH AT BEDTIME Patient taking differently: Take 600 mg by mouth at bedtime. 01/15/17   Ofilia Neas, PA-C  HYDROcodone-acetaminophen (NORCO) 5-325 MG tablet Take 1-2 tablets by mouth every 4 (four) hours as needed for moderate pain (Use sparingly). Patient taking differently: Take 1 tablet by mouth in the morning and at bedtime. 05/07/22   Marshia Ly, PA-C  methocarbamol (ROBAXIN) 500 MG tablet Take 1 tablet  (500 mg total) by mouth every 8 (eight) hours as needed for muscle spasms. Patient taking differently: Take 500 mg by mouth in the morning and at bedtime. 08/08/21   Haskel Schroeder, PA-C  mirtazapine (REMERON) 15 MG tablet Take 15 mg by mouth at bedtime.    [provider]  Misc Natural Products (GLUCOSAMINE CHONDROITIN TRIPLE) TABS Take 1 tablet by mouth in the morning and at bedtime.    [provider]  montelukast (SINGULAIR) 10 MG tablet Take 1 tablet (10 mg total) by mouth at bedtime. 01/24/22   Cobb, Ruby Cola, NP  Multiple Vitamin (MULTIVITAMIN WITH MINERALS) TABS tablet Take 1 tablet by mouth every morning.    [provider]  potassium chloride SA (K-DUR,KLOR-CON) 20 MEQ tablet Take 1 tablet (20 mEq total) by mouth 2 (two) times daily. 07/23/16   [name], MD  predniSONE (DELTASONE) 10 MG tablet Take prednisone 20 mg (two tabs) once daily for 2 days then take prednisone 10 mg (1 tab) once daily for 2 days then stop 11/24/22   Danford, Earl Lites, MD  promethazine-dextromethorphan (PROMETHAZINE-DM) 6.25-15 MG/5ML syrup Take 2.5 mLs by mouth 3 (three) times daily as needed for cough. 01/05/23   Wallis Bamberg, PA-C  rosuvastatin (CRESTOR) 5 MG tablet Take 5 mg by mouth at bedtime. 12/20/18   [provider]  testosterone cypionate (DEPOTESTOSTERONE CYPIONATE) 200 MG/ML injection Inject 150 mg into the muscle every Saturday.    [provider]  UNKNOWN TO PATIENT Take 1 tablet by mouth See admin instructions. Unnamed urinary tablet- Take 1 tablet by mouth in the morning    [provider]    Physical Exam: Vitals:   04/07/23 1955 04/07/23 2030 04/07/23 2100 04/07/23 2140  BP: (!) 144/71 (!) 118/97 (!) 167/80   Pulse: 75 (!) 119 75   Resp: (!) 22  (!) 22   Temp:    97.7 F (36.5 C)  TempSrc:    Oral  SpO2: 95% 100% 96%    Constitutional: Sitting up on the side of the bed, frequent cough Eyes: EOMI, lids and conjunctivae  normal ENMT: Mucous membranes are  moist. Posterior pharynx clear of any exudate or lesions.Normal dentition.  Neck: normal, supple, no masses. Respiratory: Coarse expiratory wheezing throughout the lung fields. Normal respiratory effort while on 2 L O2 via [place]. No accessory muscle use.  Frequent cough. Cardiovascular: Regular rate and rhythm, no murmurs / rubs / gallops.  Type +2 bilateral lower extremity edema. Abdomen: no tenderness, no masses palpated.  Musculoskeletal: no clubbing / cyanosis. No joint deformity upper and lower extremities. Good ROM, no contractures. Normal muscle tone.  Skin: no rashes, lesions, ulcers. No induration Neurologic:  Sensation intact. Strength 5/5 in all 4.  Psychiatric: Alert and oriented x 3. Normal mood.   EKG: Personally reviewed. Sinus rhythm, rate 74, no acute ischemic changes  Assessment/Plan Principal Problem:   Acute respiratory failure with hypoxia (HCC) Active Problems:   Hyperlipidemia   Benign prostatic hyperplasia with lower urinary tract symptoms   Stage 3b chronic kidney disease (CKD) (HCC)   OSA (obstructive sleep apnea)   Tracheobronchomalacia   Aspiration pneumonia (HCC)   Asthma   [name] is a 82 y.o. male with medical history significant for asthma, CKD stage IIIb, HTN, HLD, BPH, OSA on CPAP who is admitted with acute hypoxic respiratory failure due to aspiration pneumonia, asthma, and severe tracheobronchomalacia.  Assessment and Plan: Acute respiratory failure with hypoxia Asthma exacerbation Aspiration pneumonia Severe tracheobronchomalacia: CT shows evidence of severe tracheobronchomalacia and debris/inflammation bilateral lower lung fields.  Patient does report aspiration episodes.  He has significant wheezing throughout the lung fields.  SpO2 was 84% on room air in pulm clinic.  He does not use supplemental O2 at baseline.  Currently stable on 2-3 L O2 via [place]. -Start IV Unasyn -Start Brovana/Pulmicort, hypertonic  saline nebulizers -Albuterol nebulizers as needed -IV Solu-Medrol 40 mg twice daily -IS, FV, mucolytics, Singulair -Aspiration precaution -Continue supplemental O2 and wean as able -IV PPI BID  CKD stage IIIb: Renal function stable.  Continue to monitor.  Hypokalemia: Supplement.  Hyperlipidemia: Continue rosuvastatin.  Chronic lower extremity edema: Continue Lasix. TTE 01/12/2023 showed EF 60-65%, mild LVH.  BPH: Continue doxazosin.  Chronic pain: Continue gabapentin and Norco prn with hold parameters.  Severe OSA: CPAP nightly.   DVT prophylaxis: enoxaparin (LOVENOX) injection 40 mg Start: 04/08/23 1000 Code Status: Full code, confirmed with patient on admission Family Communication: Discussed with patient, he has discussed with family Disposition Plan: From home and likely discharge to home pending clinical progress Consults called: None Severity of Illness: The appropriate patient status for this patient is OBSERVATION. Observation status is judged to be reasonable and necessary in order to provide the required intensity of service to ensure the patient's safety. The patient's presenting symptoms, physical exam findings, and initial radiographic and laboratory data in the context of their medical condition is felt to place them at decreased risk for further clinical deterioration. Furthermore, it is anticipated that the patient will be medically stable for discharge from the hospital within 2 midnights of admission.   Darreld Mclean MD [facility]  If 7PM-7AM, please contact night-coverage [email or web address]  04/07/2023, 10:45 PM

## 2023-04-07 NOTE — ED Provider Notes (Signed)
Emergency Department Provider Note   I have reviewed the triage vital signs and the nursing notes.   HISTORY  Chief Complaint Shortness of Breath and Cough   HPI [name] is a 82 y.o. male with PMH of asthma, CKD, GERD, HTN, and LE edema chronically presents emergency department with breath and productive cough.  Symptoms have been increasing over the past 7 days.  Denies any fever.  Cough is productive with green/brown mucus at times.  He does have a history of asthma but states this does not feel similar to his asthma flares.  No wheezing.  Denies chest discomfort.  Notes that his legs are always swollen he is unsure if they are worse than normal.  He initially drove to his PCP office but they could not see him and found him to be SOB so called EMS. Patient placed on 3L [place].   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   Hyperlipidemia    Joint pain    Neuromuscular disorder (HCC)    Pre-diabetes    Prediabetes    Sleep apnea    uses CPAP nightly   SOB (shortness of breath)    Swallowing difficulty    Swelling of lower extremity    right more than leg leg   Umbilical hernia     Review of Systems  Constitutional: No fever/chills Cardiovascular: Denies chest pain. Respiratory: Positive shortness of breath and cough.  Gastrointestinal: No abdominal pain.   Musculoskeletal: Negative for back pain. Skin: Negative for  rash. Neurological: Negative for headaches.  ____________________________________________   PHYSICAL EXAM:  VITAL SIGNS: ED Triage Vitals  Enc Vitals Group     BP 04/07/23 1755 (!) 157/138     Pulse Rate 04/07/23 1755 80     Resp 04/07/23 1755 (!) 24     Temp 04/07/23 1755 98.1 F (36.7 C)     Temp Source 04/07/23 1755 Oral     SpO2 04/07/23 1755 91 %   Constitutional: Alert and oriented. Well appearing and in no acute distress. Eyes: Conjunctivae are normal. Head: Atraumatic. Nose: No congestion/rhinnorhea. Mouth/Throat: Mucous membranes are moist. Neck: No stridor.   Cardiovascular: Normal rate, regular rhythm. Good peripheral circulation. Grossly normal heart sounds.   Respiratory: Increased respiratory effort.  No retractions. Lungs with rhonchi at the right lateral lung and base. No wheezing. . Gastrointestinal: Soft and nontender. No distention.  Musculoskeletal: No lower extremity tenderness with edema and venous stasis dermatitis bilaterally. No gross deformities of extremities. Neurologic:  Normal speech and language. No gross focal neurologic deficits are appreciated.  Skin:  Skin is warm, dry and intact. No rash noted.  ____________________________________________   LABS (all labs ordered are listed, but only abnormal results are displayed)  Labs Reviewed  COMPREHENSIVE METABOLIC PANEL - Abnormal; Notable for the following components:      Result Value   Potassium 3.2 (*)    Glucose, Bld 112 (*)    Creatinine, Ser 1.82 (*)    Calcium 8.5 (*)    GFR, Estimated 37 (*)    All other components within normal limits  SARS CORONAVIRUS 2 BY RT PCR  CBC WITH DIFFERENTIAL/PLATELET  BRAIN NATRIURETIC PEPTIDE  TROPONIN I (HIGH SENSITIVITY)  TROPONIN I (HIGH SENSITIVITY)   ____________________________________________  EKG   EKG Interpretation  Date/Time:  Tuesday Apr 07 2023 18:28:16 EDT Ventricular Rate:  74 PR Interval:  203 QRS Duration: 99 QT  Interval:  427 QTC Calculation: 474 R Axis:   45 Text Interpretation: Sinus rhythm Confirmed by Alona Bene [phone]) on 04/07/2023 6:30:28 PM        ____________________________________________  RADIOLOGY  CT Angio Chest PE W and/or Wo Contrast  Result Date: 04/07/2023 CLINICAL DATA:  Pulmonary embolism (PE) suspected, high prob EXAM: CT ANGIOGRAPHY CHEST WITH CONTRAST TECHNIQUE: Multidetector CT imaging of the chest was performed using the standard protocol during bolus administration of intravenous contrast. Multiplanar CT image reconstructions and MIPs were obtained to evaluate the vascular anatomy. RADIATION DOSE REDUCTION: This exam was performed according to the departmental dose-optimization program which includes automated exposure control, adjustment of the mA and/or kV according to patient size and/or use of iterative reconstruction technique. CONTRAST:  75mL OMNIPAQUE IOHEXOL 350 MG/ML SOLN COMPARISON:  01/14/2023 FINDINGS: Cardiovascular: SVC patent. Heart size normal. No pericardial effusion. The RV is nondilated. Satisfactory opacification of pulmonary arteries noted, and there is no evidence of pulmonary emboli. 3-vessel coronary calcifications. Adequate contrast opacification of the thoracic aorta with no evidence of dissection, aneurysm, or stenosis. There is bovine variant brachiocephalic arch anatomy without proximal stenosis. Scattered calcified atheromatous plaque in the descending thoracic aorta. Mediastinum/Nodes: There is narrowing of the distal trachea and proximal mainstem bronchi, increased since previous. No mediastinal hematoma or pathologic adenopathy. Lungs/Pleura: Debris in central airways of the right lower lobe and left lower lobe, with some patchy bilateral lower lobe peribronchial airspace opacities and inferior airspace consolidation, increased since most recent prior scan. Upper Abdomen: Bilateral urolithiasis largest visualized stone 1.1 cm mid left kidney,  without hydronephrosis. No acute findings. Musculoskeletal: Multilevel spondylitic changes throughout the visualized lower cervical and thoracic spine, with a mild thoracic dextroscoliosis. Advanced left shoulder DJD. Review of the MIP images confirms the above findings. IMPRESSION: 1. Negative for acute PE or thoracic aortic dissection. 2. Debris in the central airways of the right lower lobe and left lower lobe, with patchy bilateral lower lobe peribronchial airspace opacities and inferior airspace consolidation, increased since most recent prior scan. 3. 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 4. Bilateral urolithiasis. 5. Coronary and aortic Atherosclerosis (ICD10-I70.0). Electronically Signed   By: Corlis Leak M.D.   On: 04/07/2023 20:33   DG Chest Portable 1 View  Result Date: 04/07/2023 CLINICAL DATA:  Cough, dyspnea EXAM: PORTABLE CHEST 1 VIEW COMPARISON:  None Available. FINDINGS: The heart size and mediastinal contours are within normal limits. Both lungs are clear. The visualized skeletal structures are unremarkable. IMPRESSION: No active disease. Electronically Signed   By: Helyn Numbers M.D.   On: 04/07/2023 18:58    ____________________________________________   PROCEDURES  Procedure(s) performed:   Procedures  CRITICAL CARE Performed by: Maia Plan Total critical care time: 35 minutes Critical care time was exclusive of separately billable procedures  and treating other patients. Critical care was necessary to treat or prevent imminent or life-threatening deterioration. Critical care was time spent personally by me on the following activities: development of treatment plan with patient and/or surrogate as well as nursing, discussions with consultants, evaluation of patient's response to treatment, examination of patient, obtaining history from patient or surrogate, ordering and performing treatments and  interventions, ordering and review of laboratory studies, ordering and review of radiographic studies, pulse oximetry and re-evaluation of patient's condition.  Alona Bene, MD Emergency Medicine  ____________________________________________   INITIAL IMPRESSION / ASSESSMENT AND PLAN / ED COURSE  Pertinent labs & imaging results that were available during my care of the patient were reviewed by me and considered in my medical decision making (see chart for details).   This patient is Presenting for Evaluation of SOB, which does require a range of treatment options, and is a complaint that involves a high risk of morbidity and mortality.  The Differential Diagnoses includes but is not exclusive to acute coronary syndrome, aortic dissection, pulmonary embolism, cardiac tamponade, community-acquired pneumonia, pericarditis, musculoskeletal chest wall pain, etc.   Critical Interventions-    Medications  azithromycin (ZITHROMAX) 500 mg in sodium chloride 0.9 % 250 mL IVPB (500 mg Intravenous New Bag/Given 04/07/23 2102)  ipratropium-albuterol (DUONEB) 0.5-2.5 (3) MG/3ML nebulizer solution 3 mL (3 mLs Nebulization Given 04/07/23 1817)  iohexol (OMNIPAQUE) 350 MG/ML injection 75 mL (75 mLs Intravenous Contrast Given 04/07/23 2001)  cefTRIAXone (ROCEPHIN) 1 g in sodium chloride 0.9 % 100 mL IVPB (0 g Intravenous Stopped 04/07/23 2132)    Reassessment after intervention:  SOB improved on supplemental O2.    Clinical Laboratory Tests Ordered, included CBC without anemia or leukocytosis. Troponin normal. COVID negative. Creatinine elevated to 1.82.   Radiologic Tests Ordered, included CTA PE and CXR. I independently interpreted the images and agree with radiology interpretation.   Cardiac Monitor Tracing which shows NSR.    Social Determinants of Health Risk no smoking history.   Consult complete with [facility]. Plan for admit.   Medical Decision Making: Summary:  Presents the emergency department  with shortness of breath.  He has a new oxygen requirement.  No pleuritic chest pain.  Exam does not seem consistent with asthma.  DuoNeb, however given history, while waiting on additional results.   Reevaluation with update and discussion with patient. Plan for IV abx with airway consolidation on CT. New O2 requirement. Plan for admit.   Patient's presentation is most consistent with acute presentation with potential threat to life or bodily function.   Disposition: admit  ____________________________________________  FINAL CLINICAL IMPRESSION(S) / ED DIAGNOSES  Final diagnoses:  Shortness of breath  Bronchitis  Community acquired pneumonia, unspecified laterality   Note:  This document was prepared using Dragon voice recognition software and may include unintentional dictation errors.  Alona Bene, MD, [facility] Emergency Medicine    Jesyka Slaght, Arlyss Repress, MD 04/07/23 2203

## 2023-04-07 NOTE — ED Triage Notes (Signed)
EMS reports from home c/o increasing SOB since last week. Pt states he has ben having coughing fits and productive cough with green and brown mucus. Hx of asthma.  BP 160/90 HR 82 RR 24 Sp02 93 RA (98 on 3Ltrs 02) CBG 128  18ga LAC

## 2023-04-07 NOTE — Telephone Encounter (Signed)
Pt presented to office after 5 pm stating he was SOB and feels faint after coughing spell  No appts remaining after 5 pm  Sats checked and he was 84% ra when arrived and quickly increased to 96%ra after resting  Pt able to speak in complete sentences and was not in any acute distress  Tammy Parrett examined pt  EMS was called as the pt did not feel he could drive to the hospital  Pt left via EMS to be transported to hospital  If not admitted will see him at 9:30 am for acute ov

## 2023-04-07 NOTE — Telephone Encounter (Signed)
Pt. Came in off ice with SOB and nurse and Np tammy parrett did check pt. Out EMS was called

## 2023-04-07 NOTE — ED Notes (Signed)
Pt resting in bed with no acute distress noted at this time.  

## 2023-04-08 ENCOUNTER — Encounter (HOSPITAL_COMMUNITY): Payer: Self-pay | Admitting: Internal Medicine

## 2023-04-08 ENCOUNTER — Ambulatory Visit: Payer: Medicare Other | Admitting: Student

## 2023-04-08 DIAGNOSIS — J45901 Unspecified asthma with (acute) exacerbation: Secondary | ICD-10-CM | POA: Diagnosis present

## 2023-04-08 DIAGNOSIS — J4531 Mild persistent asthma with (acute) exacerbation: Secondary | ICD-10-CM | POA: Diagnosis not present

## 2023-04-08 DIAGNOSIS — I5033 Acute on chronic diastolic (congestive) heart failure: Secondary | ICD-10-CM | POA: Diagnosis not present

## 2023-04-08 DIAGNOSIS — Z6841 Body Mass Index (BMI) 40.0 and over, adult: Secondary | ICD-10-CM | POA: Diagnosis not present

## 2023-04-08 DIAGNOSIS — Z1152 Encounter for screening for COVID-19: Secondary | ICD-10-CM | POA: Diagnosis not present

## 2023-04-08 DIAGNOSIS — R7303 Prediabetes: Secondary | ICD-10-CM | POA: Diagnosis present

## 2023-04-08 DIAGNOSIS — Z8249 Family history of ischemic heart disease and other diseases of the circulatory system: Secondary | ICD-10-CM | POA: Diagnosis not present

## 2023-04-08 DIAGNOSIS — E785 Hyperlipidemia, unspecified: Secondary | ICD-10-CM | POA: Diagnosis present

## 2023-04-08 DIAGNOSIS — J9621 Acute and chronic respiratory failure with hypoxia: Secondary | ICD-10-CM | POA: Diagnosis present

## 2023-04-08 DIAGNOSIS — K449 Diaphragmatic hernia without obstruction or gangrene: Secondary | ICD-10-CM | POA: Diagnosis not present

## 2023-04-08 DIAGNOSIS — K2100 Gastro-esophageal reflux disease with esophagitis, without bleeding: Secondary | ICD-10-CM | POA: Diagnosis present

## 2023-04-08 DIAGNOSIS — J189 Pneumonia, unspecified organism: Secondary | ICD-10-CM | POA: Diagnosis not present

## 2023-04-08 DIAGNOSIS — J40 Bronchitis, not specified as acute or chronic: Secondary | ICD-10-CM

## 2023-04-08 DIAGNOSIS — Z833 Family history of diabetes mellitus: Secondary | ICD-10-CM | POA: Diagnosis not present

## 2023-04-08 DIAGNOSIS — G8929 Other chronic pain: Secondary | ICD-10-CM | POA: Diagnosis present

## 2023-04-08 DIAGNOSIS — F32A Depression, unspecified: Secondary | ICD-10-CM | POA: Diagnosis present

## 2023-04-08 DIAGNOSIS — Z96652 Presence of left artificial knee joint: Secondary | ICD-10-CM | POA: Diagnosis present

## 2023-04-08 DIAGNOSIS — E876 Hypokalemia: Secondary | ICD-10-CM | POA: Diagnosis present

## 2023-04-08 DIAGNOSIS — Z96642 Presence of left artificial hip joint: Secondary | ICD-10-CM | POA: Diagnosis present

## 2023-04-08 DIAGNOSIS — I130 Hypertensive heart and chronic kidney disease with heart failure and stage 1 through stage 4 chronic kidney disease, or unspecified chronic kidney disease: Secondary | ICD-10-CM | POA: Diagnosis present

## 2023-04-08 DIAGNOSIS — K224 Dyskinesia of esophagus: Secondary | ICD-10-CM | POA: Diagnosis present

## 2023-04-08 DIAGNOSIS — N1832 Chronic kidney disease, stage 3b: Secondary | ICD-10-CM | POA: Diagnosis present

## 2023-04-08 DIAGNOSIS — J9620 Acute and chronic respiratory failure, unspecified whether with hypoxia or hypercapnia: Secondary | ICD-10-CM | POA: Diagnosis not present

## 2023-04-08 DIAGNOSIS — R0602 Shortness of breath: Secondary | ICD-10-CM | POA: Diagnosis present

## 2023-04-08 DIAGNOSIS — J690 Pneumonitis due to inhalation of food and vomit: Secondary | ICD-10-CM | POA: Diagnosis present

## 2023-04-08 DIAGNOSIS — N401 Enlarged prostate with lower urinary tract symptoms: Secondary | ICD-10-CM | POA: Diagnosis present

## 2023-04-08 DIAGNOSIS — R609 Edema, unspecified: Secondary | ICD-10-CM | POA: Diagnosis not present

## 2023-04-08 DIAGNOSIS — Z8349 Family history of other endocrine, nutritional and metabolic diseases: Secondary | ICD-10-CM | POA: Diagnosis not present

## 2023-04-08 DIAGNOSIS — G4733 Obstructive sleep apnea (adult) (pediatric): Secondary | ICD-10-CM | POA: Diagnosis present

## 2023-04-08 DIAGNOSIS — J9809 Other diseases of bronchus, not elsewhere classified: Secondary | ICD-10-CM | POA: Diagnosis present

## 2023-04-08 DIAGNOSIS — J9601 Acute respiratory failure with hypoxia: Secondary | ICD-10-CM | POA: Diagnosis not present

## 2023-04-08 LAB — BASIC METABOLIC PANEL
Anion gap: 10 (ref 5–15)
BUN: 16 mg/dL (ref 8–23)
CO2: 27 mmol/L (ref 22–32)
Calcium: 8.3 mg/dL — ABNORMAL LOW (ref 8.9–10.3)
Chloride: 101 mmol/L (ref 98–111)
Creatinine, Ser: 1.7 mg/dL — ABNORMAL HIGH (ref 0.61–1.24)
GFR, Estimated: 40 mL/min — ABNORMAL LOW (ref >60)
Glucose, Bld: 142 mg/dL — ABNORMAL HIGH (ref 70–99)
Potassium: 4 mmol/L (ref 3.5–5.1)
Sodium: 138 mmol/L (ref 135–145)

## 2023-04-08 LAB — CBC
HCT: 40.8 % (ref 39.0–52.0)
Hemoglobin: 13.1 g/dL (ref 13.0–17.0)
MCH: 28.5 pg (ref 26.0–34.0)
MCHC: 32.1 g/dL (ref 30.0–36.0)
MCV: 88.9 fL (ref 80.0–100.0)
Platelets: 180 10*3/uL (ref 150–400)
RBC: 4.59 MIL/uL (ref 4.22–5.81)
RDW: 14.1 % (ref 11.5–15.5)
WBC: 5.2 10*3/uL (ref 4.0–10.5)
nRBC: 0 % (ref 0.0–0.2)

## 2023-04-08 MED ORDER — IPRATROPIUM-ALBUTEROL 0.5-2.5 (3) MG/3ML IN SOLN
3.0000 mL | Freq: Three times a day (TID) | RESPIRATORY_TRACT | Status: DC
  Administered 2023-04-09 – 2023-04-10 (×4): 3 mL via RESPIRATORY_TRACT
  Filled 2023-04-08 (×4): qty 3

## 2023-04-08 MED ORDER — PHENOL 1.4 % MT LIQD: 1.0000 | OROMUCOSAL | Status: DC | PRN

## 2023-04-08 MED ORDER — FLUTICASONE PROPIONATE 50 MCG/ACT NA SUSP
1.0000 | Freq: Every day | NASAL | Status: DC
  Administered 2023-04-08 – 2023-04-10 (×3): 1 via NASAL
  Filled 2023-04-08: qty 16

## 2023-04-08 MED ORDER — FUROSEMIDE 10 MG/ML IJ SOLN
60.0000 mg | Freq: Three times a day (TID) | INTRAMUSCULAR | Status: DC
  Administered 2023-04-08 – 2023-04-09 (×3): 60 mg via INTRAVENOUS
  Filled 2023-04-08 (×3): qty 6

## 2023-04-08 MED ORDER — GUAIFENESIN-DM 100-10 MG/5ML PO SYRP
5.0000 mL | ORAL_SOLUTION | ORAL | Status: DC | PRN
  Administered 2023-04-08: 5 mL via ORAL
  Filled 2023-04-08 (×2): qty 5

## 2023-04-08 MED ORDER — IPRATROPIUM-ALBUTEROL 0.5-2.5 (3) MG/3ML IN SOLN: 3.0000 mL | Freq: Four times a day (QID) | RESPIRATORY_TRACT | Status: DC

## 2023-04-08 MED ORDER — IPRATROPIUM-ALBUTEROL 0.5-2.5 (3) MG/3ML IN SOLN
3.0000 mL | Freq: Four times a day (QID) | RESPIRATORY_TRACT | Status: DC
  Administered 2023-04-08 (×2): 3 mL via RESPIRATORY_TRACT
  Filled 2023-04-08 (×2): qty 3

## 2023-04-08 NOTE — Consult Note (Signed)
NAME:  [name], MRN:  [id number], DOB:  1941/01/23, LOS: 0 ADMISSION DATE:  04/07/2023, CONSULTATION DATE:  04/08/23 REFERRING MD:  Isidoro Donning - [facility], CHIEF COMPLAINT:  SOB hypoxia    History of Present Illness:  82 yo M PMH GERD, CKD IIIb, OSA on CPAP who presented to ED 5/28 w worse SOB/DOE. Has had worse DOE for several weeks, intermittently has productive cough. Has also been having some difficulty swallowing x 8-10 mo. Does not feel like legs are more swollen than usual, does endorse unintentional wt gain ( in chart review, looks like he is Up 25lb since December).  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  PCCM consulted 5/29 in this setting.   Pertinent  Medical History  GERD Asthma CKD IIIb HTN HLD OSA  Venous insufficiency  Significant Hospital Events: Including procedures, antibiotic start and stop dates in addition to other pertinent events   5/28 admit to [facility] SOB hypoxia started on cap coverage and steroids, CTA no PE but c/f tracheobronchomalacia, aspiration. Abx changed to [facility] 5/19 pccm consult   Interim History / Subjective:  Admitted last night   Often cannot speak in full sentences   Objective   Blood pressure (!) 163/70, pulse 63, temperature 97.9 F (36.6 C), temperature source Oral, resp. rate 20, SpO2 97 %.        Intake/Output Summary (Last 24 hours) at 04/08/2023 1004 Last data filed at 04/08/2023 0748 Gross per 24 hour  Intake 201.94 ml  Output 600 ml  Net -398.06 ml   There were no vitals filed for this visit.  Examination: General: chronically ill elderly M NAD  HENT: NCAT redundant neck tissue  Lungs: Symmetrical chest expansion  Cardiovascular: rr cap refill <3 sec  Abdomen: round soft  Extremities: BLE edema. L pedal edema. R great toe amputation.   Neuro: AAOx4  GU: defer  Resolved Hospital Problem list     Assessment & Plan:    Acute respiratory failure with hypoxia Tracheobronchomalacia Suspected asthma  GERD Dysphagia  OSA on CPAP  Possible pulmonary hypertension  Allergic rhinitis -has a neg BNP but hx is really concerning for volume excess. ECHo in march with normal EF, indeterminate diastolic parameters. Normal PASP and RVSP 30.1  -reports feeling like food sometimes gets stuck when he swallows.  -sounds like GERD sx burden ebbs and flows but can be fairly severe  P -diurese -dont think huge utility in re-ordering an ECHO, had one pretty recently  -dc steroids -cont unasyn -mucinex -SLP consult -will order IS. Has a flutter already -monteleukast, flonase -we did talk about "worst case scenario" so to speak re trach (do not at all think this is where we are at clinically) and he is clear this would never be of consideration for him   Best Practice (right click and "Reselect all SmartList Selections" daily)   Per primary  Labs   CBC: Recent Labs  Lab 04/07/23 1815 04/08/23 0546  WBC 5.5 5.2  NEUTROABS 3.8  --   HGB 13.1 13.1  HCT 40.3 40.8  MCV 89.0 88.9  PLT 200 180    Basic Metabolic Panel: Recent Labs  Lab 04/07/23 1815 04/08/23 0546  NA 137 138  K 3.2* 4.0  CL 99 101  CO2 29 27  GLUCOSE 112* 142*  BUN 15 16  CREATININE 1.82* 1.70*  CALCIUM 8.5* 8.3*   GFR:  CrCl cannot be calculated (Unknown ideal weight.). Recent Labs  Lab 04/07/23 1815 04/08/23 0546  WBC 5.5 5.2    Liver Function Tests: Recent Labs  Lab 04/07/23 1815  AST 29  ALT 27  ALKPHOS 79  BILITOT 0.7  PROT 7.7  ALBUMIN 3.9   No results for input(s): "LIPASE", "AMYLASE" in the last 168 hours. No results for input(s): "AMMONIA" in the last 168 hours.  ABG    Component Value Date/Time   TCO2 30 03/14/2020 0817     Coagulation Profile: No results for input(s): "INR", "PROTIME" in the last 168 hours.  Cardiac Enzymes: No results for input(s): "CKTOTAL", "CKMB", "CKMBINDEX", "TROPONINI" in  the last 168 hours.  HbA1C: Hemoglobin A1C  Date/Time Value Ref Range Status  03/11/2016 01:57 PM 5.8  Final  11/10/2015 04:43 PM 5.5  Final   Hgb A1c MFr Bld  Date/Time Value Ref Range Status  04/16/2022 10:36 AM 5.7 (H) 4.8 - 5.6 % Final    Comment:    (NOTE) Pre diabetes:          5.7%-6.4%  Diabetes:              >6.4%  Glycemic control for   <7.0% adults with diabetes   08/27/2015 09:20 PM 5.7 (H) <5.7 % Final    Comment:                                                                           According to the ADA Clinical Practice Recommendations for 2011, when HbA1c is used as a screening test:     >=6.5%   Diagnostic of Diabetes Mellitus            (if abnormal result is confirmed)   5.7-6.4%   Increased risk of developing Diabetes Mellitus   References:Diagnosis and Classification of Diabetes Mellitus,Diabetes Care,2011,34(Suppl 1):S62-S69 and Standards of Medical Care in         Diabetes - 2011,Diabetes Care,2011,34 (Suppl 1):S11-S61.       CBG: No results for input(s): "GLUCAP" in the last 168 hours.  Review of Systems:   Review of Systems  Constitutional: Negative.   HENT:  Positive for congestion.   Eyes: Negative.   Respiratory:  Positive for cough, sputum production and shortness of breath.   Cardiovascular:  Positive for orthopnea and leg swelling.  Gastrointestinal:  Positive for heartburn.  Genitourinary: Negative.   Musculoskeletal: Negative.   Skin:  Positive for itching and rash.  Neurological: Negative.   Endo/Heme/Allergies: Negative.      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   Surgical History:   Past Surgical History:  Procedure Laterality Date   ABDOMINAL AORTOGRAM W/LOWER EXTREMITY N/A 03/14/2020   Procedure: ABDOMINAL AORTOGRAM W/LOWER EXTREMITY;  Surgeon: Nada Libman, MD;  Location: [facility]  INVASIVE CV LAB;  Service: Vascular;  Laterality: N/A;   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   AMPUTATION TOE Right 04/18/2022   Procedure: AMPUTATION RIGHT GREAT TOE;  Surgeon: Jodi Geralds, MD;  Location: [facility]S;  Service: Orthopedics;  Laterality: Right;   BACK SURGERY  2003   L4, L5   BACK SURGERY     CATARACT EXTRACTION, BILATERAL     ENDOVENOUS ABLATION SAPHENOUS VEIN W/ LASER Right 06-01-2014   EVLA right greater saphenous vein by Gretta Began MD    epidural steroid injections        [facility]   hernia repair  2003   HERNIA REPAIR     hernia inguinal x 2   TOTAL HIP ARTHROPLASTY Left 05/05/2022   Procedure: LEFT TOTAL HIP ARTHROPLASTY ANTERIOR APPROACH;  Surgeon: Jodi Geralds, MD;  Location: [facility];  Service: Orthopedics;  Laterality: Left;   TOTAL KNEE ARTHROPLASTY Left 03/22/2015   Procedure: TOTAL KNEE ARTHROPLASTY;  Surgeon: Cammy Copa, MD;  Location: [facility];  Service: Orthopedics;  Laterality: Left;   UMBILICAL HERNIA REPAIR N/A 06/27/2016   Procedure: LAPAROSCOPIC ASSISTED REPAIR OF UMBILICAL HERINA WITH MESH;  Surgeon: Luretha Murphy, MD;  Location: [facility];  Service: General;  Laterality: N/A;   VASCULAR SURGERY  2015   right leg     Social History:   reports that he has never smoked. He has never used smokeless tobacco. He reports current alcohol use. He reports that he does not use drugs.   Family History:  His family history includes Diabetes in his maternal grandfather; Heart disease in his father;  Hypertension in his father; Thyroid disease in his mother. There is no history of Colon cancer or Esophageal cancer.   Allergies No Known Allergies   Home Medications  Prior to Admission medications   Medication Sig Start Date End Date Taking? Authorizing Provider  albuterol (VENTOLIN HFA) 108 (90 Base) MCG/ACT inhaler Inhale 2 puffs into the lungs every 6 (six) hours as needed for wheezing or shortness of breath. 02/03/22  Yes Cobb, Ruby Cola, NP  ASTEPRO 205.5 MCG/SPRAY SOLN Place 2 sprays into both nostrils in the morning.   Yes [provider]  Budeson-Glycopyrrol-Formoterol (BREZTRI AEROSPHERE) 160-9-4.8 MCG/ACT AERO Inhale 2 puffs into the lungs in the morning and at bedtime.   Yes [provider]  buPROPion (WELLBUTRIN XL) 150 MG 24 hr tablet Take 450 mg by mouth every morning.   Yes [provider]  doxazosin (CARDURA) 8 MG tablet TAKE 1 TABLET BY MOUTH DAILY  . Take at night Patient taking differently: Take 8 mg by mouth at bedtime. 03/11/16  Yes [name], MD  esomeprazole (NEXIUM 24HR) 20 MG capsule Take 20 mg by mouth See admin instructions. Take 20 mg by mouth in the morning 30 minutes before breakfast and an additional 20 mg once a day as needed for continued heartburn   Yes [provider]  fluticasone (FLONASE ALLERGY RELIEF) 50 MCG/ACT nasal spray Place 2 sprays into both nostrils every evening.   Yes [provider]  furosemide (LASIX) 40 MG tablet Take 1 tablet (40 mg total) by mouth daily. Take 40 mg by mouth in the morning and an additional 40 mg once a day as needed for unresolved swelling or fluid retention 12/17/22  Yes Runell Gess, MD  gabapentin (NEURONTIN) 300 MG capsule TAKE 1 TO 2 CAPSULES BY MOUTH AT BEDTIME Patient taking differently: Take  600 mg by mouth at bedtime. 01/15/17  Yes Ofilia Neas, PA-C  HYDROcodone-acetaminophen (NORCO) 5-325 MG tablet Take 1-2 tablets by mouth every 4 (four) hours as needed for  moderate pain (Use sparingly). Patient taking differently: Take 1 tablet by mouth in the morning and at bedtime. 05/07/22  Yes Marshia Ly, PA-C  methocarbamol (ROBAXIN) 500 MG tablet Take 1 tablet (500 mg total) by mouth every 8 (eight) hours as needed for muscle spasms. Patient taking differently: Take 500 mg by mouth in the morning and at bedtime. 08/08/21  Yes Haskel Schroeder, PA-C  mirtazapine (REMERON) 15 MG tablet Take 15 mg by mouth at bedtime.   Yes [provider]  Multiple Vitamin (MULTIVITAMIN WITH MINERALS) TABS tablet Take 1 tablet by mouth every morning.   Yes [provider]  potassium chloride SA (K-DUR,KLOR-CON) 20 MEQ tablet Take 1 tablet (20 mEq total) by mouth 2 (two) times daily. 07/23/16  Yes [name], MD  rosuvastatin (CRESTOR) 5 MG tablet Take 5 mg by mouth at bedtime. 12/20/18  Yes [provider]  testosterone cypionate (DEPOTESTOSTERONE CYPIONATE) 200 MG/ML injection Inject 150 mg into the muscle every Saturday.   Yes [provider]  albuterol (PROVENTIL) (2.5 MG/3ML) 0.083% nebulizer solution Take 3 mLs (2.5 mg total) by nebulization every 4 (four) hours as needed for wheezing or shortness of breath. Patient not taking: Reported on 04/08/2023 11/23/22   Alberteen Sam, MD  amoxicillin-clavulanate (AUGMENTIN) 875-125 MG tablet Take 1 tablet by mouth 2 (two) times daily. Patient not taking: Reported on 04/08/2023 01/05/23   Wallis Bamberg, PA-C  azithromycin (ZITHROMAX) 250 MG tablet Day 1: take 2 tablets. Day 2-5: Take 1 tablet daily. Patient not taking: Reported on 04/08/2023 01/05/23   Wallis Bamberg, PA-C  docusate sodium (COLACE) 100 MG capsule Take 1 capsule (100 mg total) by mouth 2 (two) times daily. Patient not taking: Reported on 04/08/2023 04/23/22   Marshia Ly, PA-C  montelukast (SINGULAIR) 10 MG tablet Take 1 tablet (10 mg total) by mouth at bedtime. Patient not taking: Reported on 04/08/2023 01/24/22   Noemi Chapel, NP  predniSONE (DELTASONE) 10 MG tablet Take prednisone 20 mg (two tabs) once daily for 2 days then take prednisone 10 mg (1 tab) once daily for 2 days then stop Patient not taking: Reported on 04/08/2023 11/24/22   Alberteen Sam, MD  promethazine-dextromethorphan (PROMETHAZINE-DM) 6.25-15 MG/5ML syrup Take 2.5 mLs by mouth 3 (three) times daily as needed for cough. Patient not taking: Reported on 04/08/2023 01/05/23   Wallis Bamberg, PA-C     Critical care time: na     Tessie Fass MSN, AGACNP-BC [facility] Pulmonary/Critical Care Medicine Amion for pager  04/08/2023, 11:36 AM

## 2023-04-08 NOTE — Progress Notes (Signed)
   04/08/23 0027  BiPAP/CPAP/SIPAP  $ Non-Invasive Home Ventilator  Initial  BiPAP/CPAP/SIPAP Pt Type Adult  BiPAP/CPAP/SIPAP DREAMSTATIOND  Mask Type Nasal mask (PER HOME REGIMEN)  Mask Size Large  Respiratory Rate 20 breaths/min  EPAP 10 cmH2O (HOME SETTINGS PER PT)  Flow Rate 2 lpm  Patient Home Equipment No  Auto Titrate No  CPAP/SIPAP surface wiped down Yes  BiPAP/CPAP /SiPAP Vitals  Pulse Rate 72  Resp 20  SpO2 93 %  MEWS Score/Color  MEWS Score 0  MEWS Score Color Green

## 2023-04-08 NOTE — Progress Notes (Signed)
   04/08/23 2341  BiPAP/CPAP/SIPAP  BiPAP/CPAP/SIPAP Pt Type Adult  BiPAP/CPAP/SIPAP DREAMSTATIOND  Mask Type Nasal mask (per home regimen)  Mask Size Large  Respiratory Rate 20 breaths/min  EPAP 10 cmH2O (home settings per pt)  Flow Rate 4 lpm  Patient Home Equipment No  Auto Titrate No  CPAP/SIPAP surface wiped down Yes  BiPAP/CPAP /SiPAP Vitals  Pulse Rate 74  Resp 20  SpO2 92 %  MEWS Score/Color  MEWS Score 0  MEWS Score Color [place] Si

## 2023-04-08 NOTE — Progress Notes (Signed)
[facility]                                                                              [name], is a 82 y.o. male, DOB - [birth year], ZOX:[id number] Admit date - 04/07/2023    Outpatient Primary MD for the patient is Gweneth Dimitri, MD  LOS - 0  days  Chief Complaint  Patient presents with   Shortness of Breath   Cough       Brief summary   Patient is 82 year old male with asthma, CKD stage IIIb, HTN, HLP, BPH, OSA on CPAP presented with shortness of breath and cough.  Patient reported several days of progressive shortness of breath with cough productive of greenish/yellow sputum.  Dyspnea worse when laying flat.  He had had elevated at baseline when he sleeps.  He reports adherence to CPAP and sleeping.  Having significant heartburn/reflux symptoms and intermittent episodes of choking sensation and feeling of taking mucus content into his lungs. Patient initially went to pulmonology clinic and was found to be hypoxic with sats 84% on room air and was subsequently sent to ED for further evaluation.  COVID-19 negative. CTA chest negative for PE or thoracic aortic dissection, debris's in the central area of the right lower and left lower lobes noted with patchy bilateral lower lobe peribronchial airspace opacities and inferior airspace consolidation.  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  Patient was admitted for further workup.  Assessment & Plan    Principal Problem: Acute respiratory failure with hypoxia (HCC) Acute asthma exacerbation.  Aspiration pneumonia with severe tracheobronchomalacia - CT shows evidence of severe tracheobronchomalacia and debris/inflammation bilateral lower lung fields.  -Patient also reported significant reflux symptoms with aspiration episodes.   -O2 sats 84% on room air at the pulmonology clinic, not on O2 at baseline, currently on 3 L O2 via [place] -Patient  placed on IV Unasyn, will continue. -SLP evaluation today, for now continue clears -Appreciate pulmonology recommendations, placed on aggressive IV Lasix for diuresis.  Steroids discontinued. -Aspiration precaution  Active problems  CKD stage IIIb: Renal function stable.  Continue to monitor.   Hypokalemia: -Resolved, replete as needed   Hyperlipidemia: -Continue rosuvastatin.   Chronic lower extremity edema: - 2D echo 01/12/2023 showed EF 60-65%, mild LVH. -Appreciate pulmonology recommendations, placed on aggressive IV Lasix diuresis   BPH: Continue doxazosin.   Chronic pain: Continue gabapentin and Norco prn    Severe OSA: Continue CPAP qhs   Morbid obesity Estimated body mass index is 40.91 kg/m as calculated from the following:   Height as of 01/02/23: 5\' 9"  (1.753 m).   Weight as of 01/02/23: 125.6 kg.  Code Status: Full code DVT Prophylaxis:  enoxaparin (LOVENOX) injection 40 mg Start: 04/08/23 1000   Level of Care: Level of care: Med-Surg Family Communication: No family members in the room Disposition Plan:      Remains inpatient appropriate: Workup in progress   Procedures:  None  Consultants:   Pulmonology  Antimicrobials:   Anti-infectives (From admission, onward)    Start     Dose/Rate Route Frequency Ordered Stop  04/07/23 2215  Ampicillin-Sulbactam (UNASYN) 3 g in sodium chloride 0.9 % 100 mL IVPB        3 g 200 mL/hr over 30 Minutes Intravenous Every 6 hours 04/07/23 2207     04/07/23 2100  cefTRIAXone (ROCEPHIN) 1 g in sodium chloride 0.9 % 100 mL IVPB        1 g 200 mL/hr over 30 Minutes Intravenous  Once 04/07/23 2054 04/07/23 2132   04/07/23 2100  azithromycin (ZITHROMAX) 500 mg in sodium chloride 0.9 % 250 mL IVPB        500 mg 250 mL/hr over 60 Minutes Intravenous  Once 04/07/23 2054 04/07/23 2202          Medications  arformoterol  15 mcg Nebulization BID   budesonide (PULMICORT) nebulizer solution  0.25 mg Nebulization BID    doxazosin  8 mg Oral QHS   enoxaparin (LOVENOX) injection  40 mg Subcutaneous Q24H   fluticasone  1 spray Each Nare Daily   furosemide  60 mg Intravenous Q8H   gabapentin  600 mg Oral QHS   guaiFENesin  1,200 mg Oral BID   ipratropium-albuterol  3 mL Nebulization Q6H   montelukast  10 mg Oral QHS   pantoprazole (PROTONIX) IV  40 mg Intravenous Q12H   rosuvastatin  5 mg Oral QHS      Subjective:   [name] was seen and examined today.  Multiple complaints, with congestion, cough, shortness of breath.  No acute nausea vomiting or abdominal pain. + Lower extremity swelling  Objective:   Vitals:   04/08/23 0407 04/08/23 0408 04/08/23 0742 04/08/23 0746  BP: (!) 175/86 (!) 168/87  (!) 163/70  Pulse: 60 62  63  Resp: 18   20  Temp: 97.8 F (36.6 C)   97.9 F (36.6 C)  TempSrc: Oral   Oral  SpO2: 94% 90% 94% 97%    Intake/Output Summary (Last 24 hours) at 04/08/2023 1209 Last data filed at 04/08/2023 1143 Gross per 24 hour  Intake 402 ml  Output 850 ml  Net -448 ml     Wt Readings from Last 3 Encounters:  01/02/23 125.6 kg  12/17/22 126.6 kg  11/22/22 59.6 kg     Exam General: Alert and oriented x 3, NAD, ill-appearing Cardiovascular: S1 S2 auscultated,  RRR Respiratory: Bilateral scattered rhonchi Gastrointestinal: Soft, nontender, nondistended, + bowel sounds Ext: 2+ pedal edema bilaterally Neuro: Strength 5/5 upper and lower extremities bilaterally Psych: Normal affect     Data Reviewed:  I have personally reviewed following labs    CBC Lab Results  Component Value Date   WBC 5.2 04/08/2023   RBC 4.59 04/08/2023   HGB 13.1 04/08/2023   HCT 40.8 04/08/2023   MCV 88.9 04/08/2023   MCH 28.5 04/08/2023   PLT 180 04/08/2023   MCHC 32.1 04/08/2023   RDW 14.1 04/08/2023   LYMPHSABS 1.1 04/07/2023   MONOABS 0.5 04/07/2023   EOSABS 0.2 04/07/2023   BASOSABS 0.0 04/07/2023     Last metabolic panel Lab Results  Component Value Date   NA 138  04/08/2023   K 4.0 04/08/2023   CL 101 04/08/2023   CO2 27 04/08/2023   BUN 16 04/08/2023   CREATININE 1.70 (H) 04/08/2023   GLUCOSE 142 (H) 04/08/2023   GFRNONAA 40 (L) 04/08/2023   GFRAA 48 (L) 06/24/2016   CALCIUM 8.3 (L) 04/08/2023   PHOS 4.2 11/18/2022   PROT 7.7 04/07/2023   ALBUMIN 3.9 04/07/2023   BILITOT 0.7  04/07/2023   ALKPHOS 79 04/07/2023   AST 29 04/07/2023   ALT 27 04/07/2023   ANIONGAP 10 04/08/2023    CBG (last 3)  No results for input(s): "GLUCAP" in the last 72 hours.    Coagulation Profile: No results for input(s): "INR", "PROTIME" in the last 168 hours.   Radiology Studies: I have personally reviewed the imaging studies  CT Angio Chest PE W and/or Wo Contrast  Result Date: 04/07/2023 CLINICAL DATA:  Pulmonary embolism (PE) suspected, high prob EXAM: CT ANGIOGRAPHY CHEST WITH CONTRAST TECHNIQUE: Multidetector CT imaging of the chest was performed using the standard protocol during bolus administration of intravenous contrast. Multiplanar CT image reconstructions and MIPs were obtained to evaluate the vascular anatomy. RADIATION DOSE REDUCTION: This exam was performed according to the departmental dose-optimization program which includes automated exposure control, adjustment of the mA and/or kV according to patient size and/or use of iterative reconstruction technique. CONTRAST:  75mL OMNIPAQUE IOHEXOL 350 MG/ML SOLN COMPARISON:  01/14/2023 FINDINGS: Cardiovascular: SVC patent. Heart size normal. No pericardial effusion. The RV is nondilated. Satisfactory opacification of pulmonary arteries noted, and there is no evidence of pulmonary emboli. 3-vessel coronary calcifications. Adequate contrast opacification of the thoracic aorta with no evidence of dissection, aneurysm, or stenosis. There is bovine variant brachiocephalic arch anatomy without proximal stenosis. Scattered calcified atheromatous plaque in the descending thoracic aorta. Mediastinum/Nodes: There is  narrowing of the distal trachea and proximal mainstem bronchi, increased since previous. No mediastinal hematoma or pathologic adenopathy. Lungs/Pleura: Debris in central airways of the right lower lobe and left lower lobe, with some patchy bilateral lower lobe peribronchial airspace opacities and inferior airspace consolidation, increased since most recent prior scan. Upper Abdomen: Bilateral urolithiasis largest visualized stone 1.1 cm mid left kidney, without hydronephrosis. No acute findings. Musculoskeletal: Multilevel spondylitic changes throughout the visualized lower cervical and thoracic spine, with a mild thoracic dextroscoliosis. Advanced left shoulder DJD. Review of the MIP images confirms the above findings. IMPRESSION: 1. Negative for acute PE or thoracic aortic dissection. 2. Debris in the central airways of the right lower lobe and left lower lobe, with patchy bilateral lower lobe peribronchial airspace opacities and inferior airspace consolidation, increased since most recent prior scan. 3. 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 4. Bilateral urolithiasis. 5. Coronary and aortic Atherosclerosis (ICD10-I70.0). Electronically Signed   By: Corlis Leak M.D.   On: 04/07/2023 20:33   DG Chest Portable 1 View  Result Date: 04/07/2023 CLINICAL DATA:  Cough, dyspnea EXAM: PORTABLE CHEST 1 VIEW COMPARISON:  None Available. FINDINGS: The heart size and mediastinal contours are within normal limits. Both lungs are clear. The visualized skeletal structures are unremarkable. IMPRESSION: No active disease. Electronically Signed   By: Helyn Numbers M.D.   On: 04/07/2023 18:58       Antha Niday M.D. [facility] 04/08/2023, 12:09 PM  Available via Epic secure chat 7am-7pm After 7 pm, please refer to night coverage provider listed on amion.

## 2023-04-09 ENCOUNTER — Telehealth: Payer: Self-pay | Admitting: Internal Medicine

## 2023-04-09 ENCOUNTER — Inpatient Hospital Stay: Payer: Medicare Other

## 2023-04-09 ENCOUNTER — Inpatient Hospital Stay (HOSPITAL_COMMUNITY): Payer: Medicare Other

## 2023-04-09 ENCOUNTER — Encounter (HOSPITAL_COMMUNITY): Payer: Self-pay | Admitting: Internal Medicine

## 2023-04-09 ENCOUNTER — Other Ambulatory Visit: Payer: Self-pay | Admitting: Physician Assistant

## 2023-04-09 DIAGNOSIS — J40 Bronchitis, not specified as acute or chronic: Secondary | ICD-10-CM | POA: Diagnosis not present

## 2023-04-09 DIAGNOSIS — J9620 Acute and chronic respiratory failure, unspecified whether with hypoxia or hypercapnia: Secondary | ICD-10-CM | POA: Diagnosis not present

## 2023-04-09 DIAGNOSIS — R0602 Shortness of breath: Secondary | ICD-10-CM

## 2023-04-09 DIAGNOSIS — R609 Edema, unspecified: Secondary | ICD-10-CM

## 2023-04-09 DIAGNOSIS — R42 Dizziness and giddiness: Secondary | ICD-10-CM

## 2023-04-09 DIAGNOSIS — J189 Pneumonia, unspecified organism: Secondary | ICD-10-CM | POA: Diagnosis not present

## 2023-04-09 DIAGNOSIS — J9601 Acute respiratory failure with hypoxia: Secondary | ICD-10-CM | POA: Diagnosis not present

## 2023-04-09 LAB — BASIC METABOLIC PANEL
Anion gap: 10 (ref 5–15)
BUN: 25 mg/dL — ABNORMAL HIGH (ref 8–23)
CO2: 28 mmol/L (ref 22–32)
Calcium: 8.3 mg/dL — ABNORMAL LOW (ref 8.9–10.3)
Chloride: 101 mmol/L (ref 98–111)
Creatinine, Ser: 2 mg/dL — ABNORMAL HIGH (ref 0.61–1.24)
GFR, Estimated: 33 mL/min — ABNORMAL LOW (ref >60)
Glucose, Bld: 125 mg/dL — ABNORMAL HIGH (ref 70–99)
Potassium: 3.1 mmol/L — ABNORMAL LOW (ref 3.5–5.1)
Sodium: 139 mmol/L (ref 135–145)

## 2023-04-09 LAB — MAGNESIUM: Magnesium: 2 mg/dL (ref 1.7–2.4)

## 2023-04-09 MED ORDER — FUROSEMIDE 40 MG PO TABS
40.0000 mg | ORAL_TABLET | Freq: Every day | ORAL | Status: DC
  Administered 2023-04-09 – 2023-04-10 (×2): 40 mg via ORAL
  Filled 2023-04-09 (×2): qty 1

## 2023-04-09 MED ORDER — POTASSIUM CHLORIDE CRYS ER 20 MEQ PO TBCR
40.0000 meq | EXTENDED_RELEASE_TABLET | Freq: Two times a day (BID) | ORAL | Status: AC
  Administered 2023-04-09 (×2): 40 meq via ORAL
  Filled 2023-04-09 (×2): qty 2

## 2023-04-09 MED ORDER — AMOXICILLIN-POT CLAVULANATE 875-125 MG PO TABS
1.0000 | ORAL_TABLET | Freq: Two times a day (BID) | ORAL | Status: DC
  Administered 2023-04-09 – 2023-04-10 (×3): 1 via ORAL
  Filled 2023-04-09 (×3): qty 1

## 2023-04-09 NOTE — Progress Notes (Signed)
Speech Language Pathology Treatment: Dysphagia  Patient Details Name: LEART DUKE MRN: [id number] DOB: 02-Apr-1941 Today's Date: 04/09/2023 Time: 1250-1300 SLP Time Calculation (min) (ACUTE ONLY): 10 min  Assessment / Plan / Recommendation Clinical Impression  SLP session to review esophagram that revealed normal oropharyngeal function but significant esophageal deficits as suspected. Pt endorses episode of coughing last night to near passing out not associated with po intake. Advised he speak to his cardiologist and pulmonary MD re: this event. Using teach back, all education completed - chronic aspiration risk from his GERD/esophageal dysmotility present. Given he reports drinking liquids to help clear solid stuck in esophagus is not helpful, advised he allow time for it to clear - as suspect his dysmotility causes retrograde propulsion when drinking liquids. He advised this is his practice anyway.   Advised he start intake with warm liquids - and consume small frequent meals, staying upright as much as able. He does advise he sleeps on bed with some head elevation.    Per review with pt, he is implementing many of strategies advised per his report.  No SLP follow up indicated as all educated completed.    HPI HPI: Patient is 82 year old male with asthma, CKD stage IIIb, HTN, HLP, BPH, OSA on CPAP presented with shortness of breath and cough from pulmonary office.  Patient reported several days of progressive shortness of breath with cough productive of greenish/yellow sputum. Dyspnea worse when laying flat. He had had elevated at baseline when he sleeps. He reports adherence to CPAP and sleeping. Having significant heartburn/reflux symptoms and intermittent episodes of choking sensation and feeling of taking mucus content into his lungs. COVID-19 negative. CTA chest negative for PE or thoracic aortic dissection, debris's in the central area of the right lower and left lower lobes noted with patchy  bilateral lower lobe peribronchial airspace opacities and inferior airspace consolidation. 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 Swallow eval ordered.  Pt has h/o GERD and is on PPI PTA.      SLP Plan  All goals met      Recommendations for follow up therapy are one component of a multi-disciplinary discharge planning process, led by the attending physician.  Recommendations may be updated based on patient status, additional functional criteria and insurance authorization.    Recommendations  Diet recommendations: Dysphagia 3 (mechanical soft);Thin liquid Liquids provided via: Cup;Straw Medication Administration:  (as tolerated) Supervision: Patient able to self feed Compensations: Slow rate;Small sips/bites;Other (Comment) (start intake with warm liquids)                  Oral care BID     Dysphagia, unspecified (R13.10)     All goals met   Rolena Infante, MS [facility] SLP Acute Rehab Services Office [phone]   Chales Abrahams  04/09/2023, 1:10 PM

## 2023-04-09 NOTE — Progress Notes (Signed)
   04/09/23 2320  BiPAP/CPAP/SIPAP  BiPAP/CPAP/SIPAP Pt Type Adult  BiPAP/CPAP/SIPAP DREAMSTATIOND  Mask Type Nasal mask  Mask Size Large  Respiratory Rate 20 breaths/min  EPAP 10 cmH2O (home settings per pt)  Flow Rate 4 lpm  Patient Home Equipment No  Auto Titrate No  CPAP/SIPAP surface wiped down Yes  BiPAP/CPAP /SiPAP Vitals  Pulse Rate 88  Resp 20  SpO2 93 %  MEWS Score/Color  MEWS Score 0  MEWS Score Color [place] Si

## 2023-04-09 NOTE — Progress Notes (Signed)
Staff message sent to arrange outpatient monitor

## 2023-04-09 NOTE — Consult Note (Signed)
   NAME:  [name], MRN:  [id number], DOB:  1941-06-18, LOS: 1 ADMISSION DATE:  04/07/2023, CONSULTATION DATE:  04/08/23 REFERRING MD:  Isidoro Donning - [facility], CHIEF COMPLAINT:  SOB hypoxia    History of Present Illness:  82 yo M PMH GERD, CKD IIIb, OSA on CPAP who presented to ED 5/28 w worse SOB/DOE. Has had worse DOE for several weeks, intermittently has productive cough. Has also been having some difficulty swallowing x 8-10 mo. Does not feel like legs are more swollen than usual, does endorse unintentional wt gain ( in chart review, looks like he is Up 25lb since December).  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  PCCM consulted 5/29 in this setting.   Pertinent  Medical History  GERD Asthma CKD IIIb HTN HLD OSA  Venous insufficiency  Significant Hospital Events: Including procedures, antibiotic start and stop dates in addition to other pertinent events   5/28 admit to [facility] SOB hypoxia started on cap coverage and steroids, CTA no PE but c/f tracheobronchomalacia, aspiration. Abx changed to [facility] 5/19 pccm consult   Interim History / Subjective:  Had one episode of coughing where he got dizzy yesterday. Overall otherwise breathing feeling close to back to normal.  Objective   Blood pressure (!) 142/59, pulse (!) 51, temperature 98 F (36.7 C), resp. rate 18, SpO2 95 %.    FiO2 (%):  [28 %] 28 %   Intake/Output Summary (Last 24 hours) at 04/09/2023 0828 Last data filed at 04/09/2023 0756 Gross per 24 hour  Intake 320.06 ml  Output 2975 ml  Net -2654.94 ml    There were no vitals filed for this visit.  Examination: No distress Facial plethora Chronic lymphedema Moves to command Loquacious Aox3  Mild hypo-K and Cr bump noted  Resolved Hospital Problem list     Assessment & Plan:   Acute respiratory failure with hypoxia Tracheobronchomalacia Reactive airway disease  NOS GERD Dysphagia  OSA on CPAP  Possible pulmonary hypertension  Allergic rhinitis R/o aspiration syndrome  - MBSS today - Antireflux meds, nebs as ordered, okay to go back on home triple therapy on DC - Abx from unasyn to augmentin x 6 more days - Continue PPI, allergy meds - Diuretics to PO - Replete K - qHS CPAP - Will arrange f/u next week in pulm clinic for BMP and symptom check - Long term no great solutions to this syndrome (end stage tracheobronchomalacia) other than PAP, weight loss, euvolemia and removing other inciting factors (allergies/postnasal drip, aspiration); stents do not work well, tracheostomy and constant PAP an option but patient not interested  Will be available PRN, please reach out if any questions or concerns  Myrla Halsted MD PCCM

## 2023-04-09 NOTE — Consult Note (Signed)
Cardiology Consultation   Patient ID: ARMAHN TRIERWEILER MRN: [id number]; DOB: 1941-09-29  Admit date: 04/07/2023 Date of Consult: 04/09/2023  PCP:  Gweneth Dimitri, MD   [name] Cardiologist:  Nanetta Batty, MD        Patient Profile:   [name] is a 82 y.o. male with a hx of CKD stage III, hypertension, hyperlipidemia, prediabetes, OSA on CPAP, asthma and venous insufficiency who is being seen 04/09/2023 for the evaluation of leg edema and volume overload at the request of Dr. Isidoro Donning.  History of Present Illness:   [name] is a obese 82 year old male with past medical history of CKD stage III, hypertension, hyperlipidemia, prediabetes, OSA on CPAP, asthma and venous insufficiency.  Patient previously underwent vein stripping by Dr. Arbie Cookey and has also been followed by Dr. Guss Bunde of [facility] vein specialist.  Myoview in April 2018 demonstrated EF 45%, mild diffuse hypokinesis, no ischemia was noted, overall low risk study.  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  Repeat echocardiogram in September 2020 showed normal EF, grade 1 DD.  He has chronic dyspnea on exertion that was felt to be multifactorial from obesity, diastolic dysfunction and reactive irritable disease.  He has been followed by Dr. Isaiah Serge of pulmonology service due to dyspnea on exertion.  I last saw the patient in June 2023 for preop clearance prior to left total hip surgery.  He was admitted in January 2024 with acute respiratory failure in the setting of influenza, RSV and asthmatic flare.  He was discharged on oxygen therapy however he did not use it.  He was seen by Dr. Gery Pray on 12/17/2022 again for dyspnea on exertion.  He denied any chest pain.  Echocardiogram obtained on 01/12/2023 demonstrated EF 60 to 65%, no regional wall motion abnormality, RVSP 30.1 mmHg, trivial MR, mildly dilated ascending aorta measuring at 38 mm.  He was  diagnosed with pneumonia in the ED on 01/05/2023.  According to the patient, since discharge, his breathing has been good on some days but worse other days.  He walks around with a cane and does not do much activity.  He works in a Psychologist, counselling and drinks roughly 2 L of fluid per day.  For the past 5 weeks, he has been noticing worsening leg edema.  He says his right lower extremity is chronically larger than the left side, however in the past several weeks, his left lower extremity has also been very swollen as well.  He has also been having productive cough with yellow-green sputum.  One of his major concern is after his coughing spells, he would feel faint.  He went to Dr. Shirlee More office on 04/07/2023 due to shortness of breath and feeling faint after coughing spell.  His O2 saturation was 84% on arrival and quickly increased to 96% after resting.  He was subsequently sent to [facility] for further evaluation.  Since arrival, he has been placed on 2 L nasal cannula.  Initial blood work included potassium 3.2, creatinine 1.82, BNP 13.7, serial troponin 9--> 10, normal CBC.  COVID test negative.  CTA of the chest obtained on 03/30/2023 was negative for PE or dissection, there was debris in the central airways of the right lower lobe and left lower lobe with patchy bilateral lower lobe peribronchial airspace opacity and inferior airspace consolidation which has increased since the prior scan, significant narrowing of the distal  trachea and proximal mainstem bronchi consistent with tracheobronchomalacia.  He was seen by pulmonology service who is concerned of chronic intermittent aspiration due to significant acid reflux and his tracheobronchomalacia.  However there is no good treatment for tracheobronchomalacia, PCCM recommended weight loss, Pap, euvolemia and removing allergens to help his symptom conservatively.  He is not interested in tracheostomy or constant.  Due to significant lower extremity  edema, he has been placed on IV diuresis 60 mg every 8 hours since yesterday. I/O not accurate as he had many unmeasured urine output.  Weight is not documented.  Left lower extremity edema has significantly improved.  Right lower extremity is chronically swollen.  Cardiology service consulted for possible heart failure component that contribute to his oxygen requirement.  Talking with the patient, he does not think his acid reflux is that bad.  He is more concerned about the near fainting spell he had every time he coughs.     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   Hyperlipidemia    Joint pain    Neuromuscular disorder (HCC)    Pre-diabetes    Prediabetes    Sleep apnea    uses CPAP nightly   SOB (shortness of breath)    Swallowing difficulty    Swelling of lower extremity    right more than leg leg   Umbilical hernia     Past Surgical History:  Procedure Laterality Date   ABDOMINAL AORTOGRAM W/LOWER EXTREMITY N/A 03/14/2020   Procedure: ABDOMINAL AORTOGRAM W/LOWER EXTREMITY;  Surgeon: Nada Libman, MD;  Location: [facility] INVASIVE [facility];  Service: Vascular;  Laterality: N/A;   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   AMPUTATION TOE Right 04/18/2022   Procedure: AMPUTATION RIGHT GREAT TOE;  Surgeon: Jodi Geralds, MD;  Location: [facility] ORS;  Service: Orthopedics;  Laterality: Right;   BACK SURGERY  2003   L4, L5    BACK SURGERY     CATARACT EXTRACTION, BILATERAL     ENDOVENOUS ABLATION SAPHENOUS VEIN W/ LASER Right 06-01-2014   EVLA right greater saphenous vein by Gretta Began MD    epidural steroid injections        [facility]   hernia repair  2003   HERNIA REPAIR     hernia inguinal x 2   TOTAL HIP ARTHROPLASTY Left 05/05/2022   Procedure: LEFT TOTAL HIP ARTHROPLASTY ANTERIOR APPROACH;  Surgeon: Jodi Geralds, MD;  Location: [facility] ORS;  Service: Orthopedics;  Laterality: Left;   TOTAL KNEE ARTHROPLASTY Left 03/22/2015   Procedure: TOTAL KNEE ARTHROPLASTY;  Surgeon: Cammy Copa, MD;  Location: [facility] OR;  Service: Orthopedics;  Laterality: Left;   UMBILICAL HERNIA REPAIR N/A 06/27/2016   Procedure: LAPAROSCOPIC ASSISTED REPAIR OF  UMBILICAL HERINA WITH MESH;  Surgeon: Luretha Murphy, MD;  Location: [facility] ORS;  Service: General;  Laterality: N/A;   VASCULAR SURGERY  2015   right leg     Home Medications:  Prior to Admission medications   Medication Sig Start Date End Date Taking? Authorizing Provider  albuterol (VENTOLIN HFA) 108 (90 Base) MCG/ACT inhaler Inhale 2 puffs into the lungs every 6 (six) hours as needed for wheezing or shortness of breath. 02/03/22  Yes Cobb, Ruby Cola, NP  ASTEPRO 205.5 MCG/SPRAY SOLN Place 2 sprays into both nostrils in the morning.   Yes [provider]  Budeson-Glycopyrrol-Formoterol (BREZTRI AEROSPHERE) 160-9-4.8 MCG/ACT AERO Inhale 2 puffs into the lungs in the morning and at bedtime.   Yes [provider]  buPROPion (WELLBUTRIN XL) 150 MG 24 hr tablet Take 450 mg by mouth every morning.   Yes [provider]  doxazosin (CARDURA) 8 MG tablet TAKE 1 TABLET BY MOUTH DAILY  . Take at night Patient taking differently: Take 8 mg by mouth at bedtime. 03/11/16  Yes [name], MD  esomeprazole (NEXIUM 24HR) 20 MG capsule Take 20 mg by mouth See admin instructions. Take 20 mg by mouth in the morning 30 minutes before breakfast and an  additional 20 mg once a day as needed for continued heartburn   Yes [provider]  fluticasone (FLONASE ALLERGY RELIEF) 50 MCG/ACT nasal spray Place 2 sprays into both nostrils every evening.   Yes [provider]  furosemide (LASIX) 40 MG tablet Take 1 tablet (40 mg total) by mouth daily. Take 40 mg by mouth in the morning and an additional 40 mg once a day as needed for unresolved swelling or fluid retention 12/17/22  Yes Runell Gess, MD  gabapentin (NEURONTIN) 300 MG capsule TAKE 1 TO 2 CAPSULES BY MOUTH AT BEDTIME Patient taking differently: Take 600 mg by mouth at bedtime. 01/15/17  Yes Ofilia Neas, PA-C  HYDROcodone-acetaminophen (NORCO) 5-325 MG tablet Take 1-2 tablets by mouth every 4 (four) hours as needed for moderate pain (Use sparingly). Patient taking differently: Take 1 tablet by mouth in the morning and at bedtime. 05/07/22  Yes Marshia Ly, PA-C  methocarbamol (ROBAXIN) 500 MG tablet Take 1 tablet (500 mg total) by mouth every 8 (eight) hours as needed for muscle spasms. Patient taking differently: Take 500 mg by mouth in the morning and at bedtime. 08/08/21  Yes Haskel Schroeder, PA-C  mirtazapine (REMERON) 15 MG tablet Take 15 mg by mouth at bedtime.   Yes [provider]  Multiple Vitamin (MULTIVITAMIN WITH MINERALS) TABS tablet Take 1 tablet by mouth every morning.   Yes [provider]  potassium chloride SA (K-DUR,KLOR-CON) 20 MEQ tablet Take 1 tablet (20 mEq total) by mouth 2 (two) times daily. 07/23/16  Yes [name], MD  rosuvastatin (CRESTOR) 5 MG tablet Take 5 mg by mouth at bedtime. 12/20/18  Yes [provider]  testosterone cypionate (DEPOTESTOSTERONE CYPIONATE) 200 MG/ML injection Inject 150 mg into the muscle every Saturday.   Yes [provider]  albuterol (PROVENTIL) (2.5 MG/3ML) 0.083% nebulizer solution Take 3 mLs (2.5 mg total) by nebulization every 4 (four) hours as needed for wheezing or  shortness of breath. Patient not taking: Reported on 04/08/2023 11/23/22   Alberteen Sam, MD  amoxicillin-clavulanate (AUGMENTIN) 875-125 MG tablet Take 1 tablet by mouth 2 (two) times daily. Patient not taking: Reported on 04/08/2023 01/05/23   Wallis Bamberg, PA-C  azithromycin [facility])  250 MG tablet Day 1: take 2 tablets. Day 2-5: Take 1 tablet daily. Patient not taking: Reported on 04/08/2023 01/05/23   Wallis Bamberg, PA-C  docusate sodium (COLACE) 100 MG capsule Take 1 capsule (100 mg total) by mouth 2 (two) times daily. Patient not taking: Reported on 04/08/2023 04/23/22   Marshia Ly, PA-C  montelukast (SINGULAIR) 10 MG tablet Take 1 tablet (10 mg total) by mouth at bedtime. Patient not taking: Reported on 04/08/2023 01/24/22   Noemi Chapel, NP  predniSONE (DELTASONE) 10 MG tablet Take prednisone 20 mg (two tabs) once daily for 2 days then take prednisone 10 mg (1 tab) once daily for 2 days then stop Patient not taking: Reported on 04/08/2023 11/24/22   Alberteen Sam, MD  promethazine-dextromethorphan (PROMETHAZINE-DM) 6.25-15 MG/5ML syrup Take 2.5 mLs by mouth 3 (three) times daily as needed for cough. Patient not taking: Reported on 04/08/2023 01/05/23   Wallis Bamberg, PA-C    Inpatient Medications: Scheduled Meds:  amoxicillin-clavulanate  1 tablet Oral Q12H   arformoterol  15 mcg Nebulization BID   budesonide (PULMICORT) nebulizer solution  0.25 mg Nebulization BID   doxazosin  8 mg Oral QHS   enoxaparin (LOVENOX) injection  40 mg Subcutaneous Q24H   fluticasone  1 spray Each Nare Daily   furosemide  40 mg Oral Daily   gabapentin  600 mg Oral QHS   guaiFENesin  1,200 mg Oral BID   ipratropium-albuterol  3 mL Nebulization TID   montelukast  10 mg Oral QHS   pantoprazole (PROTONIX) IV  40 mg Intravenous Q12H   potassium chloride  40 mEq Oral BID   rosuvastatin  5 mg Oral QHS   Continuous Infusions:  PRN Meds: acetaminophen **OR** acetaminophen, albuterol,  guaiFENesin-dextromethorphan, HYDROcodone-acetaminophen, ondansetron **OR** ondansetron (ZOFRAN) IV, phenol, senna-docusate  Allergies:   No Known Allergies  Social History:   Social History   Socioeconomic History   Marital status: Single    Spouse name: Not on file   Number of children: Not on file   Years of education: Not on file   Highest education level: Not on file  Occupational History   Occupation: Antiques/Real Estate    Employer: RETIRED  Tobacco Use   Smoking status: Never   Smokeless tobacco: Never  Vaping Use   Vaping Use: Never used  Substance and Sexual Activity   Alcohol use: Yes    Comment: rare   Drug use: No   Sexual activity: Yes    Birth control/protection: Condom  Other Topics Concern   Not on file  Social History Narrative   ** Merged History Encounter **       Single. Education: Other.    Social Determinants of Health   Financial Resource Strain: Not on file  Food Insecurity: No Food Insecurity (04/08/2023)   Hunger Vital Sign    Worried About Running Out of Food in the Last Year: Never true    Ran Out of Food in the Last Year: Never true  Transportation Needs: No Transportation Needs (04/08/2023)   PRAPARE - Administrator, Civil Service (Medical): No    Lack of Transportation (Non-Medical): No  Physical Activity: Not on file  Stress: Not on file  Social Connections: Not on file  Intimate Partner Violence: Patient Declined (04/08/2023)   Humiliation, Afraid, Rape, and Kick questionnaire    Fear of Current or Ex-Partner: Patient declined    Emotionally Abused: Patient declined    Physically Abused: Patient declined  Sexually Abused: Patient declined    Family History:    Family History  Problem Relation Age of Onset   Thyroid disease Mother    Heart disease Father    Hypertension Father    Diabetes Maternal Grandfather    Colon cancer Neg Hx    Esophageal cancer Neg Hx      ROS:  Please see the history of present  illness.   All other ROS reviewed and negative.     Physical Exam/Data:   Vitals:   04/08/23 2311 04/08/23 2341 04/09/23 0514 04/09/23 0747  BP: 128/67  (!) 142/59   Pulse:  74 (!) 51   Resp:  20 18   Temp:   98 F (36.7 C)   TempSrc:      SpO2:  92% 92% 95%    Intake/Output Summary (Last 24 hours) at 04/09/2023 0915 Last data filed at 04/09/2023 0756 Gross per 24 hour  Intake 320.06 ml  Output 2975 ml  Net -2654.94 ml      01/02/2023    3:34 PM 12/17/2022   10:46 AM 11/22/2022    6:12 AM  Last 3 Weights  Weight (lbs) 277 lb 279 lb 131 lb 6.3 oz  Weight (kg) 125.646 kg 126.554 kg 59.6 kg     There is no height or weight on file to calculate BMI.  General:  Well nourished, well developed, in no acute distress HEENT: normal Neck: no JVD Vascular: No carotid bruits; Distal pulses 2+ bilaterally Cardiac:  normal S1, S2; RRR; no murmur  Lungs: Diffusely diminished breath sound, no rales. Abd: soft, nontender, no hepatomegaly  Ext: No significant lower extremity edema on the left side, 1-2+ edema on the right lower extremity. Musculoskeletal:  No deformities, BUE and BLE strength normal and equal Skin: warm and dry  Neuro:  CNs 2-12 intact, no focal abnormalities noted Psych:  Normal affect   EKG:  The EKG was personally reviewed and demonstrates: Normal sinus rhythm, no significant ST-T wave changes. Telemetry:  Telemetry was personally reviewed and demonstrates: Not on telemetry  Relevant CV Studies:  Echo 01/12/2023 1. Left ventricular ejection fraction, by estimation, is 60 to 65%. Left  ventricular ejection fraction by 3D volume is 60 %. The left ventricle has  normal function. The left ventricle has no regional wall motion  abnormalities. There is mild left  ventricular hypertrophy. Left ventricular diastolic parameters are  indeterminate.   2. Right ventricular systolic function is normal. The right ventricular  size is normal. There is normal pulmonary artery  systolic pressure. The  estimated right ventricular systolic pressure is 30.1 mmHg.   3. The mitral valve is normal in structure. Trivial mitral valve  regurgitation.   4. The aortic valve is tricuspid. Aortic valve regurgitation is mild. No  aortic stenosis is present.   5. Aortic dilatation noted. There is mild dilatation of the ascending  aorta, measuring 38 mm.   6. The inferior vena cava is dilated in size with >50% respiratory  variability, suggesting right atrial pressure of 8 mmHg.   Laboratory Data:  High Sensitivity Troponin:   Recent Labs  Lab 04/07/23 1815 04/07/23 2105  TROPONINIHS 9 10     Chemistry Recent Labs  Lab 04/07/23 1815 04/08/23 0546 04/09/23 0607  NA 137 138 139  K 3.2* 4.0 3.1*  CL 99 101 101  CO2 29 27 28   GLUCOSE 112* 142* 125*  BUN 15 16 25*  CREATININE 1.82* 1.70* 2.00*  CALCIUM 8.5* 8.3*  8.3*  MG  --   --  2.0  GFRNONAA 37* 40* 33*  ANIONGAP 9 10 10     Recent Labs  Lab 04/07/23 1815  PROT 7.7  ALBUMIN 3.9  AST 29  ALT 27  ALKPHOS 79  BILITOT 0.7   Lipids No results for input(s): "CHOL", "TRIG", "HDL", "LABVLDL", "LDLCALC", "CHOLHDL" in the last 168 hours.  Hematology Recent Labs  Lab 04/07/23 1815 04/08/23 0546  WBC 5.5 5.2  RBC 4.53 4.59  HGB 13.1 13.1  HCT 40.3 40.8  MCV 89.0 88.9  MCH 28.9 28.5  MCHC 32.5 32.1  RDW 14.1 14.1  PLT 200 180   Thyroid No results for input(s): "TSH", "FREET4" in the last 168 hours.  BNP Recent Labs  Lab 04/07/23 1815  BNP 13.7    DDimer No results for input(s): "DDIMER" in the last 168 hours.   Radiology/Studies:  CT Angio Chest PE W and/or Wo Contrast  Result Date: 04/07/2023 CLINICAL DATA:  Pulmonary embolism (PE) suspected, high prob EXAM: CT ANGIOGRAPHY CHEST WITH CONTRAST TECHNIQUE: Multidetector CT imaging of the chest was performed using the standard protocol during bolus administration of intravenous contrast. Multiplanar CT image reconstructions and MIPs were obtained  to evaluate the vascular anatomy. RADIATION DOSE REDUCTION: This exam was performed according to the departmental dose-optimization program which includes automated exposure control, adjustment of the mA and/or kV according to patient size and/or use of iterative reconstruction technique. CONTRAST:  75mL OMNIPAQUE IOHEXOL 350 MG/ML SOLN COMPARISON:  01/14/2023 FINDINGS: Cardiovascular: SVC patent. Heart size normal. No pericardial effusion. The RV is nondilated. Satisfactory opacification of pulmonary arteries noted, and there is no evidence of pulmonary emboli. 3-vessel coronary calcifications. Adequate contrast opacification of the thoracic aorta with no evidence of dissection, aneurysm, or stenosis. There is bovine variant brachiocephalic arch anatomy without proximal stenosis. Scattered calcified atheromatous plaque in the descending thoracic aorta. Mediastinum/Nodes: There is narrowing of the distal trachea and proximal mainstem bronchi, increased since previous. No mediastinal hematoma or pathologic adenopathy. Lungs/Pleura: Debris in central airways of the right lower lobe and left lower lobe, with some patchy bilateral lower lobe peribronchial airspace opacities and inferior airspace consolidation, increased since most recent prior scan. Upper Abdomen: Bilateral urolithiasis largest visualized stone 1.1 cm mid left kidney, without hydronephrosis. No acute findings. Musculoskeletal: Multilevel spondylitic changes throughout the visualized lower cervical and thoracic spine, with a mild thoracic dextroscoliosis. Advanced left shoulder DJD. Review of the MIP images confirms the above findings. IMPRESSION: 1. Negative for acute PE or thoracic aortic dissection. 2. Debris in the central airways of the right lower lobe and left lower lobe, with patchy bilateral lower lobe peribronchial airspace opacities and inferior airspace consolidation, increased since most recent prior scan. 3. Significant narrowing of the  distal trachea and proximal mainstem bronchi consistent with tracheobronchomalacia. 4. Bilateral urolithiasis. 5. Coronary and aortic Atherosclerosis (ICD10-I70.0). Electronically Signed   By: Corlis Leak M.D.   On: 04/07/2023 20:33   DG Chest Portable 1 View  Result Date: 04/07/2023 CLINICAL DATA:  Cough, dyspnea EXAM: PORTABLE CHEST 1 VIEW COMPARISON:  None Available. FINDINGS: The heart size and mediastinal contours are within normal limits. Both lungs are clear. The visualized skeletal structures are unremarkable. IMPRESSION: No active disease. Electronically Signed   By: Helyn Numbers M.D.   On: 04/07/2023 18:58     Assessment and Plan:   Leg edema  -Echo in March 2024 showed normal EF, indeterminate diastolic function.  -He was placed on 60 mg every  8 hours of IV Lasix yesterday, this has been transitioned to 40 mg IV Lasix today.  I/O not accurate as he had many unmeasured urine.  Weight is not measured.  -He has a history of venous insufficiency followed by the both Dr. Arbie Cookey and the Dr. Guss Bunde.  He has chronic right lower extremity edema, however in the past 5 weeks, his left lower extremity has been more swollen than the right lower extremity.  This has resolved after overnight diuresis.  His left lower extremity appears to be euvolemic at this point.  His lung is clear.  BNP was normal on arrival.  -Will discuss with MD, I suspect the patient is euvolemic based on physical exam, may transition to a higher dose of home diuretic.  He was taking 40 mg daily of Lasix, consider switch to either 60 or 80 mg daily of oral Lasix.  Acute on chronic respiratory failure -Seen by pulmonology service, started on antibiotic.  COVID test negative.  There was some concern of chronic intermittent aspiration risk due to significant acid reflux, however patient adamantly denies this.  CTA of the chest was negative for PE however does show tracheobronchomalacia.  Per PCCM, patient is not interested in  constant Pap or tracheostomy, therefore recommended conservative management such as weight loss, euvolemia, removing inciting factor and Pap.  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   Dizziness/presyncope with cough: Could be related to both temporary drop in O2 saturation and vagal stimulation during cough.  His cough has not shown improvement after IV diuresis.  He complains of yellow-green productive cough which is also inconsistent with heart failure.  Suspect cough is more pulmonary in nature.  Acute on chronic renal insufficiency: Baseline creatinine 1.6.  Arrived with creatinine of 1.82, after diuresis, creatinine has increased to 2.0.  Patient appears to be euvolemic on exam, will discuss with MD to transition to oral diuretic tomorrow.  Hypertension: On Cardura  Hyperlipidemia: On Crestor  Prediabetes  OSA on CPAP: Compliant with CPAP therapy religiously.    Risk Assessment/Risk Scores:                For questions or updates, please contact [facility] Please consult [email or web address] for contact info under    Signed, Azalee Course, PA  04/09/2023 9:15 AM

## 2023-04-09 NOTE — Progress Notes (Signed)
04/08/23 1624  SLP Visit Information  SLP Received On 04/08/23  Subjective  Subjective pt awake in bed  Patient/Family Stated Goal none stated  General Information  HPI Patient is 82 year old male with asthma, CKD stage IIIb, HTN, HLP, BPH, OSA on CPAP presented with shortness of breath and cough from pulmonary office.  Patient reported several days of progressive shortness of breath with cough productive of greenish/yellow sputum. Dyspnea worse when laying flat. He had had elevated at baseline when he sleeps. He reports adherence to CPAP and sleeping. Having significant heartburn/reflux symptoms and intermittent episodes of choking sensation and feeling of taking mucus content into his lungs. COVID-19 negative. CTA chest negative for PE or thoracic aortic dissection, debris's in the central area of the right lower and left lower lobes noted with patchy bilateral lower lobe peribronchial airspace opacities and inferior airspace consolidation. 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 Swallow eval ordered.  Pt has h/o GERD and is on PPI PTA.  Type of Study Bedside Swallow Evaluation  Previous Swallow Assessment endoscopy 08/2020  Diet Prior to this Study Clear liquid diet  Temperature Spikes Noted Yes  Respiratory Status Nasal cannula  History of Recent Intubation No  Behavior/Cognition Alert;Cooperative;Pleasant mood  Oral Cavity Assessment Dry  Oral Care Completed by SLP Yes  Oral Cavity - Dentition Adequate natural dentition;Other (Comment) (some missing)  Vision Functional for self-feeding  Self-Feeding Abilities Able to feed self  Patient Positioning Upright in bed  Baseline Vocal Quality Low vocal intensity;Other (comment) (? hyponasal)  Volitional Cough Strong;Congested  Volitional Swallow Able to elicit  Pain Assessment  Pain Assessment No/denies pain  Oral Assessment (Complete on admission/transfer/every  shift)  Oral Assessment  (WDL) WDL  Lips Symmetrical  Teeth Missing (Comment) (some missing)  Tongue Red  Mucous Membrane(s) Pink  Saliva Moist, saliva free flowing  Level of Consciousness Alert  Is patient on any of following O2 devices? None of the above  Nutritional status No high risk factors  Oral Assessment Risk  Low Risk  Oral Motor/Sensory Function  Overall Oral Motor/Sensory Function WFL  Ice Chips  Ice chips NT  Thin Liquid  Thin Liquid WFL  Presentation Straw;Cup;Self Fed  Nectar Thick Liquid  Nectar Thick Liquid NT  Honey Thick Liquid  Honey Thick Liquid NT  Puree  Puree WFL  Presentation Self Fed;Spoon  Solid  Solid Impaired  Presentation Self Fed  Pharyngeal Phase Impairments Cough - Delayed  SLP - End of Session  Patient left in bed;with call bell/phone within reach;with bed alarm set  Nurse Communication Aspiration precautions reviewed;Swallow strategies reviewed;Diet recommendation  Suspected Esophageal Findings  Suspected Esophageal Findings Globus sensation;Other (comment) (globus)  SLP Assessment  Clinical Impression Statement (ACUTE ONLY) Pt greeted lying in bed, with very congested breathing and hyponasal.  He easily passed [place] 3 -ounce water challenge.  SLP observed him consuming solids, puree, and thin.  Pt reported sensation of "throat closing off" during session with solid intake and after coughing episode.  Frequent eructation and throat clearing noted.  Suspect pharyngeal swallow is largely intact.  Would advise dedicated esophageal evaluation.  Of note, pt saw Dr Orvan Falconer, GI, in s/p endoscopy 08/2020 with "2 tongues of salmon colored mucosa" at 37 cm in the esophagus (? Barrett's) and s/p sessile polyps in gastric fundus and body of s/p biopsy.  He reports he takes Nexium once a day and at times Tums in the evening *approx 2 times a week* especially  if eats reflux inducing foods, eat later or too fast.  SLP educated pt to importance of  swallow/respiratory coordination and need to take rest breaks if short of breath.  He has xerostomia - and thus advise he starts meals with liquids.  SLP will follow up briefly to help with dysphagia management.  SLP Visit Diagnosis Dysphagia, unspecified (R13.10)  Impact on safety and function Mild aspiration risk  Other Related Risk Factors History of pneumonia;History of GERD;History of esophageal-related issues;Deconditioning;Decreased respiratory status  Swallow Evaluation Recommendations  Recommended Consults Consider esophageal assessment  SLP Diet Recommendations Thin liquid;Regular (pt prefers soft foods - rec regular/thin to allow pt to choose foods he can tolerate better)  Liquid Administration via Cup;Straw  Supervision Patient able to self feed  Compensations Slow rate;Small sips/bites;Other (Comment) (Start intake with liquids; masticate well; Drink liquids t/o meals)  Postural Changes Seated upright at 90 degrees;Remain upright for at least 30 minutes after po intake  Treatment Plan  Oral Care Recommendations Oral care BID  Treatment Recommendations Therapy as outlined in treatment plan below  Follow Up Recommendations Follow physician's recommendations for discharge plan and follow up therapies  Functional Status Assessment Patient has had a recent decline in their functional status and demonstrates the ability to make significant improvements in function in a reasonable and predictable amount of time.  Speech Therapy Frequency (ACUTE ONLY) min 1 x/week  Treatment Duration 1 week  Interventions Aspiration precaution training;Compensatory techniques;Patient/family education  Prognosis  Prognosis for improved oropharyngeal function Guarded  Barriers to Reach Goals Time post onset  Individuals Consulted  Consulted and Agree with Results and Recommendations Patient;MD;RN  Report Sent to  Referring physician  Progression Toward Goals  Progression toward goals Progressing toward  goals  SLP Time Calculation  SLP Start Time (ACUTE ONLY) 1705  SLP Stop Time (ACUTE ONLY) 1749  SLP Time Calculation (min) (ACUTE ONLY) 44 min  SLP Evaluations  $ SLP Speech Visit 1 Visit  SLP Evaluations  $BSS Swallow 1 Procedure  $Swallowing Treatment 1 Procedure

## 2023-04-09 NOTE — Progress Notes (Signed)
[facility]                                                                              Wheeler Santangelo, is a 82 y.o. male, DOB - 23-Apr-1941, ZOX:[id number] Admit date - 04/07/2023    Outpatient Primary MD for the patient is Gweneth Dimitri, MD  LOS - 0  days  Chief Complaint  Patient presents with   Shortness of Breath   Cough       Brief summary   Patient is 82 year old male with asthma, CKD stage IIIb, HTN, HLP, BPH, OSA on CPAP presented with shortness of breath and cough.  Patient reported several days of progressive shortness of breath with cough productive of greenish/yellow sputum.  Dyspnea worse when laying flat.  He had had elevated at baseline when he sleeps.  He reports adherence to CPAP and sleeping.  Having significant heartburn/reflux symptoms and intermittent episodes of choking sensation and feeling of taking mucus content into his lungs. Patient initially went to pulmonology clinic and was found to be hypoxic with sats 84% on room air and was subsequently sent to ED for further evaluation.  COVID-19 negative. CTA chest negative for PE or thoracic aortic dissection, debris's in the central area of the right lower and left lower lobes noted with patchy bilateral lower lobe peribronchial airspace opacities and inferior airspace consolidation.  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  Patient was admitted for further workup.  Assessment & Plan    Principal Problem: Acute respiratory failure with hypoxia (HCC) Acute asthma exacerbation.  Aspiration pneumonia with severe tracheobronchomalacia - CT shows evidence of severe tracheobronchomalacia and debris/inflammation bilateral lower lung fields.  -Patient also reported significant reflux symptoms with aspiration episodes.   -O2 sats improving, 95% on 2 L -Patient placed on IV Unasyn, will continue. 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    Active problems   Chronic lower extremity edema, acute on chronic diastolic CHF: - 2D echo 01/12/2023 showed EF 60-65%, mild LVH.  Had presented with fluid overload, lower extremity edema, orthopnea, PND -Patient was placed on aggressive IV Lasix for diuresis on 5/29 - creatinine now trending up to 2.0.  Follows Dr. Allyson Sabal, requested cardiology consult  Dysphagia/aspiration/GERD -SLP evaluation on 5/29, mild aspirate risk, has reflux issue -Esophagogram today -Continue IV PPI twice daily  Acute on CKD stage IIIb: Baseline creatinine 1.5-1.8  -Trended up to 2.0 with IV Lasix diuresis   Hypokalemia: -Replaced   Hyperlipidemia: -Continue rosuvastatin.    BPH: Continue doxazosin.   Chronic pain: Continue gabapentin and Norco prn    Severe OSA: Continue CPAP qhs   Morbid obesity Estimated body mass index is 40.91 kg/m as calculated from the following:   Height as of 01/02/23: 5\' 9"  (1.753 m).   Weight as of 01/02/23: 125.6 kg.  Code Status: Full code DVT Prophylaxis:  enoxaparin (LOVENOX) injection 40 mg Start: 04/08/23 1000   Level of Care: Level of care: Med-Surg Family Communication: No family members  in the room Disposition Plan:      Remains inpatient appropriate: Workup in progress   Procedures:  None  Consultants:   Pulmonology Cardiology  Antimicrobials:   Anti-infectives (From admission, onward)    Start     Dose/Rate Route Frequency Ordered Stop   04/07/23 2215  Ampicillin-Sulbactam (UNASYN) 3 g in sodium chloride 0.9 % 100 mL IVPB        3 g 200 mL/hr over 30 Minutes Intravenous Every 6 hours 04/07/23 2207     04/07/23 2100  cefTRIAXone (ROCEPHIN) 1 g in sodium chloride 0.9 % 100 mL IVPB        1 g 200 mL/hr over 30 Minutes Intravenous   Once 04/07/23 2054 04/07/23 2132   04/07/23 2100  azithromycin (ZITHROMAX) 500 mg in sodium chloride 0.9 % 250 mL IVPB        500 mg 250 mL/hr over 60 Minutes Intravenous  Once 04/07/23 2054 04/07/23 2202          Medications  arformoterol  15 mcg Nebulization BID   budesonide (PULMICORT) nebulizer solution  0.25 mg Nebulization BID   doxazosin  8 mg Oral QHS   enoxaparin (LOVENOX) injection  40 mg Subcutaneous Q24H   fluticasone  1 spray Each Nare Daily   furosemide  60 mg Intravenous Q8H   gabapentin  600 mg Oral QHS   guaiFENesin  1,200 mg Oral BID   ipratropium-albuterol  3 mL Nebulization Q6H   montelukast  10 mg Oral QHS   pantoprazole (PROTONIX) IV  40 mg Intravenous Q12H   rosuvastatin  5 mg Oral QHS      Subjective:   Cristy Hilts was seen and examined today.  States does not feel too good today, felt dizzy with a coughing episode yesterday.  No fevers or chills.  No nausea vomiting or abdominal pain.   + Lower extremity swelling  Objective:   Vitals:   04/08/23 0407 04/08/23 0408 04/08/23 0742 04/08/23 0746  BP: (!) 175/86 (!) 168/87  (!) 163/70  Pulse: 60 62  63  Resp: 18   20  Temp: 97.8 F (36.6 C)   97.9 F (36.6 C)  TempSrc: Oral   Oral  SpO2: 94% 90% 94% 97%    Intake/Output Summary (Last 24 hours) at 04/08/2023 1209 Last data filed at 04/08/2023 1143 Gross per 24 hour  Intake 402 ml  Output 850 ml  Net -448 ml     Wt Readings from Last 3 Encounters:  01/02/23 125.6 kg  12/17/22 126.6 kg  11/22/22 59.6 kg   Physical Exam General: Alert and oriented x 3, NAD Cardiovascular: S1 S2 clear, RRR.  Respiratory: Diminished breath sounds, no wheezing Gastrointestinal: Soft, nontender, nondistended, NBS Ext: 1+ pedal edema bilaterally Neuro: no new deficits Psych: Normal affect     Data Reviewed:  I have personally reviewed following labs    CBC Lab Results  Component Value Date   WBC 5.2 04/08/2023   RBC 4.59 04/08/2023   HGB 13.1  04/08/2023   HCT 40.8 04/08/2023   MCV 88.9 04/08/2023   MCH 28.5 04/08/2023   PLT 180 04/08/2023   MCHC 32.1 04/08/2023   RDW 14.1 04/08/2023   LYMPHSABS 1.1 04/07/2023   MONOABS 0.5 04/07/2023   EOSABS 0.2 04/07/2023   BASOSABS 0.0 04/07/2023     Last metabolic panel Lab Results  Component Value Date   NA 138 04/08/2023   K 4.0 04/08/2023   CL 101 04/08/2023   CO2  27 04/08/2023   BUN 16 04/08/2023   CREATININE 1.70 (H) 04/08/2023   GLUCOSE 142 (H) 04/08/2023   GFRNONAA 40 (L) 04/08/2023   GFRAA 48 (L) 06/24/2016   CALCIUM 8.3 (L) 04/08/2023   PHOS 4.2 11/18/2022   PROT 7.7 04/07/2023   ALBUMIN 3.9 04/07/2023   BILITOT 0.7 04/07/2023   ALKPHOS 79 04/07/2023   AST 29 04/07/2023   ALT 27 04/07/2023   ANIONGAP 10 04/08/2023    CBG (last 3)  No results for input(s): "GLUCAP" in the last 72 hours.    Coagulation Profile: No results for input(s): "INR", "PROTIME" in the last 168 hours.   Radiology Studies: I have personally reviewed the imaging studies  CT Angio Chest PE W and/or Wo Contrast  Result Date: 04/07/2023 CLINICAL DATA:  Pulmonary embolism (PE) suspected, high prob EXAM: CT ANGIOGRAPHY CHEST WITH CONTRAST TECHNIQUE: Multidetector CT imaging of the chest was performed using the standard protocol during bolus administration of intravenous contrast. Multiplanar CT image reconstructions and MIPs were obtained to evaluate the vascular anatomy. RADIATION DOSE REDUCTION: This exam was performed according to the departmental dose-optimization program which includes automated exposure control, adjustment of the mA and/or kV according to patient size and/or use of iterative reconstruction technique. CONTRAST:  75mL OMNIPAQUE IOHEXOL 350 MG/ML SOLN COMPARISON:  01/14/2023 FINDINGS: Cardiovascular: SVC patent. Heart size normal. No pericardial effusion. The RV is nondilated. Satisfactory opacification of pulmonary arteries noted, and there is no evidence of pulmonary emboli.  3-vessel coronary calcifications. Adequate contrast opacification of the thoracic aorta with no evidence of dissection, aneurysm, or stenosis. There is bovine variant brachiocephalic arch anatomy without proximal stenosis. Scattered calcified atheromatous plaque in the descending thoracic aorta. Mediastinum/Nodes: There is narrowing of the distal trachea and proximal mainstem bronchi, increased since previous. No mediastinal hematoma or pathologic adenopathy. Lungs/Pleura: Debris in central airways of the right lower lobe and left lower lobe, with some patchy bilateral lower lobe peribronchial airspace opacities and inferior airspace consolidation, increased since most recent prior scan. Upper Abdomen: Bilateral urolithiasis largest visualized stone 1.1 cm mid left kidney, without hydronephrosis. No acute findings. Musculoskeletal: Multilevel spondylitic changes throughout the visualized lower cervical and thoracic spine, with a mild thoracic dextroscoliosis. Advanced left shoulder DJD. Review of the MIP images confirms the above findings. IMPRESSION: 1. Negative for acute PE or thoracic aortic dissection. 2. Debris in the central airways of the right lower lobe and left lower lobe, with patchy bilateral lower lobe peribronchial airspace opacities and inferior airspace consolidation, increased since most recent prior scan. 3. 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 4. Bilateral urolithiasis. 5. Coronary and aortic Atherosclerosis (ICD10-I70.0). Electronically Signed   By: Corlis Leak M.D.   On: 04/07/2023 20:33   DG Chest Portable 1 View  Result Date: 04/07/2023 CLINICAL DATA:  Cough, dyspnea EXAM: PORTABLE CHEST 1 VIEW COMPARISON:  None Available. FINDINGS: The heart size and mediastinal contours are within normal limits. Both lungs are clear. The visualized skeletal structures are unremarkable. IMPRESSION: No active disease. Electronically  Signed   By: Helyn Numbers M.D.   On: 04/07/2023 18:58       Sherryl Valido M.D. [facility] 04/08/2023, 12:09 PM  Available via Epic secure chat 7am-7pm After 7 pm, please refer to night coverage provider listed on amion.   On 5/29

## 2023-04-09 NOTE — Progress Notes (Unsigned)
Enrolled for [employer] to mail a ZIO XT long term holter monitor to the patients address on file.   Dr. Berry to read. 

## 2023-04-10 DIAGNOSIS — J9601 Acute respiratory failure with hypoxia: Secondary | ICD-10-CM | POA: Diagnosis not present

## 2023-04-10 DIAGNOSIS — J4531 Mild persistent asthma with (acute) exacerbation: Secondary | ICD-10-CM

## 2023-04-10 DIAGNOSIS — J40 Bronchitis, not specified as acute or chronic: Secondary | ICD-10-CM | POA: Diagnosis not present

## 2023-04-10 DIAGNOSIS — J189 Pneumonia, unspecified organism: Secondary | ICD-10-CM | POA: Diagnosis not present

## 2023-04-10 LAB — BASIC METABOLIC PANEL
Anion gap: 12 (ref 5–15)
BUN: 25 mg/dL — ABNORMAL HIGH (ref 8–23)
CO2: 29 mmol/L (ref 22–32)
Calcium: 8.7 mg/dL — ABNORMAL LOW (ref 8.9–10.3)
Chloride: 99 mmol/L (ref 98–111)
Creatinine, Ser: 2.06 mg/dL — ABNORMAL HIGH (ref 0.61–1.24)
GFR, Estimated: 32 mL/min — ABNORMAL LOW (ref >60)
Glucose, Bld: 103 mg/dL — ABNORMAL HIGH (ref 70–99)
Potassium: 3.3 mmol/L — ABNORMAL LOW (ref 3.5–5.1)
Sodium: 140 mmol/L (ref 135–145)

## 2023-04-10 LAB — MAGNESIUM: Magnesium: 2.1 mg/dL (ref 1.7–2.4)

## 2023-04-10 MED ORDER — MONTELUKAST SODIUM 10 MG PO TABS
10.0000 mg | ORAL_TABLET | Freq: Every day | ORAL | 5 refills | Status: DC
Start: 2023-04-10 — End: 2024-09-20

## 2023-04-10 MED ORDER — AMOXICILLIN-POT CLAVULANATE 875-125 MG PO TABS: 1.0000 | ORAL_TABLET | Freq: Two times a day (BID) | ORAL | 0 refills | Status: AC

## 2023-04-10 MED ORDER — PANTOPRAZOLE SODIUM 40 MG PO TBEC
40.0000 mg | DELAYED_RELEASE_TABLET | Freq: Two times a day (BID) | ORAL | Status: DC
  Administered 2023-04-10: 40 mg via ORAL
  Filled 2023-04-10: qty 1

## 2023-04-10 MED ORDER — PREDNISONE 10 MG PO TABS: ORAL_TABLET | ORAL | 0 refills | Status: DC

## 2023-04-10 MED ORDER — GUAIFENESIN-DM 100-10 MG/5ML PO SYRP: 5.0000 mL | ORAL_SOLUTION | ORAL | 0 refills | Status: DC | PRN

## 2023-04-10 MED ORDER — GUAIFENESIN ER 600 MG PO TB12: 1200.0000 mg | ORAL_TABLET | Freq: Two times a day (BID) | ORAL | 0 refills | Status: DC

## 2023-04-10 MED ORDER — ALUM & MAG HYDROXIDE-SIMETH 200-200-20 MG/5ML PO SUSP
30.0000 mL | ORAL | Status: DC | PRN
  Administered 2023-04-10: 30 mL via ORAL
  Filled 2023-04-10: qty 30

## 2023-04-10 MED ORDER — ALBUTEROL SULFATE HFA 108 (90 BASE) MCG/ACT IN AERS
2.0000 | INHALATION_SPRAY | Freq: Four times a day (QID) | RESPIRATORY_TRACT | 2 refills | Status: DC | PRN
Start: 2023-04-10 — End: 2023-12-02

## 2023-04-10 MED ORDER — PANTOPRAZOLE SODIUM 40 MG PO TBEC: 40.0000 mg | DELAYED_RELEASE_TABLET | Freq: Two times a day (BID) | ORAL | 3 refills | Status: AC

## 2023-04-10 MED ORDER — ALBUTEROL SULFATE (2.5 MG/3ML) 0.083% IN NEBU: 2.5000 mg | INHALATION_SOLUTION | RESPIRATORY_TRACT | 12 refills | Status: DC | PRN

## 2023-04-10 NOTE — Discharge Summary (Signed)
Physician Discharge Summary   Patient: [name] MRN: [id number] DOB: [birth year]  Admit date:     04/07/2023  Discharge date: 04/10/23  Discharge Physician: Thad Ranger, MD    PCP: Gweneth Dimitri, MD   Recommendations at discharge:   Continue Augmentin 875-125 mg twice daily for 5 more days Placed on prednisone taper 40 mg for 3 days, 30 mg for 3 days, 20 mg for 3 days, 10 mg for 3 days then off Outpatient cardiology appointment scheduled, patient recommended to follow-up with his pulmonologist, Dr. Isaiah Serge in next 2 weeks.  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  Patient reported several days of progressive shortness of breath with cough productive of greenish/yellow sputum.  Dyspnea worse when laying flat.  He had had elevated at baseline when he sleeps.  He reports adherence to CPAP and sleeping.  Having significant heartburn/reflux symptoms and intermittent episodes of choking sensation and feeling of taking mucus content into his lungs. Patient initially went to pulmonology clinic and was found to be hypoxic with sats 84% on room air and was subsequently sent to ED for further evaluation.   COVID-19 negative. CTA chest negative for PE or thoracic aortic dissection, debris's in the central area of the right lower and left lower lobes noted with patchy bilateral lower lobe peribronchial airspace opacities and inferior airspace consolidation.  Significant narrowing of the distal  trachea and proximal mainstem bronchi consistent with tracheobronchomalacia.   Patient was admitted for further workup.   Assessment and Plan: Acute respiratory failure with hypoxia (HCC) Acute asthma exacerbation.  Aspiration pneumonia with severe tracheobronchomalacia - CT shows evidence of severe tracheobronchomalacia and debris/inflammation bilateral lower lung fields.  -Patient also reported significant reflux symptoms with aspiration episodes.   -O2 sats improved, currently on room air, had a CPAP for nightly and as needed for naps -Patient placed on IV Unasyn, transition to oral Augmentin to continue 5 more days -Patient was seen by speech therapy  -Pulmonology consult was obtained, seen by Dr. 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   -Outpatient follow-up with his pulmonologist with Dr Isaiah Serge       Chronic lower extremity edema, acute on chronic diastolic CHF: - 2D echo 01/12/2023 showed EF 60-65%, mild LVH.  Had presented with fluid overload, lower extremity edema, orthopnea, PND -Patient was placed on aggressive IV Lasix for diuresis on 5/29 - creatinine trended up to  2.0, cardiology consulted, felt hypoxia symptoms and dizziness due to pulmonary issues. 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  Small hiatal hernia -Continue Protonix 40 mg twice a day   Acute on CKD stage IIIb: Baseline creatinine 1.5-1.8  -Trended up to 2.0 with IV Lasix diuresis, now transitioned back to oral Lasix at outpatient dose   Hypokalemia: -Replaced  Hyperlipidemia: -Continue rosuvastatin.     BPH: Continue doxazosin.   Chronic pain: Continue gabapentin and  Norco prn    Severe OSA: Continue CPAP qhs   Morbid obesity Estimated body mass index is 40.91 kg/m as calculated from the following:   Height as of 01/02/23: 5\' 9"  (1.753 m).   Weight as of 01/02/23: 125.6 kg.           Pain control - [employer] Controlled Substance Reporting System database was reviewed. and patient was instructed, not to drive, operate heavy machinery, perform activities at heights, swimming or participation in water activities or provide baby-sitting services while on Pain, Sleep and Anxiety Medications; until their outpatient Physician has advised to do so again. Also recommended to not to take more than prescribed Pain, Sleep and Anxiety Medications.  Consultants: Cardiology, pulmonology Procedures performed:   Disposition: Home Diet recommendation: Mechanical soft diet, dysphagia 3 with thin liquids  DISCHARGE MEDICATION: Allergies as of 04/10/2023   No Known Allergies      Medication List     STOP taking these medications    NexIUM 24HR 20 MG capsule Generic drug: esomeprazole       TAKE these medications    albuterol 108 (90 Base) MCG/ACT inhaler Commonly known as: VENTOLIN HFA Inhale 2 puffs into the lungs every 6 (six) hours as needed for wheezing or shortness of breath.   albuterol (2.5 MG/3ML) 0.083% nebulizer solution Commonly known as: PROVENTIL Take 3 mLs (2.5 mg total) by nebulization every 4 (four) hours as needed for wheezing or shortness of breath.   amoxicillin-clavulanate 875-125 MG tablet Commonly known as: AUGMENTIN Take 1 tablet by mouth 2 (two) times daily for 5 days.   Astepro 0.15 % Soln Generic drug: Azelastine HCl Place 2 sprays into both nostrils in the morning.   Breztri Aerosphere 160-9-4.8 MCG/ACT Aero Generic drug: Budeson-Glycopyrrol-Formoterol Inhale 2 puffs into the lungs in the morning and at bedtime.   buPROPion 150 MG 24 hr tablet Commonly known as: WELLBUTRIN XL Take 450 mg by mouth every  morning.   doxazosin 8 MG tablet Commonly known as: CARDURA TAKE 1 TABLET BY MOUTH DAILY  . Take at night What changed:  how much to take how to take this when to take this additional instructions   Flonase Allergy Relief 50 MCG/ACT nasal spray Generic drug: fluticasone Place 2 sprays into both nostrils every evening.   furosemide 40 MG tablet Commonly known as: LASIX Take 1 tablet (40 mg total) by mouth daily. Take 40 mg by mouth in the morning and an additional 40 mg once a day as needed for unresolved swelling or fluid retention   gabapentin 300 MG capsule Commonly known as: NEURONTIN TAKE 1 TO 2 CAPSULES BY MOUTH AT BEDTIME What changed: See the new instructions.   guaiFENesin 600 MG 12 hr tablet Commonly known as: MUCINEX Take 2 tablets (1,200 mg total) by mouth 2 (two) times daily.   guaiFENesin-dextromethorphan 100-10 MG/5ML syrup Commonly known as: ROBITUSSIN DM Take 5 mLs by mouth every 4 (four) hours as needed for cough (chest congestion).   HYDROcodone-acetaminophen 5-325 MG tablet Commonly known as: Norco Take 1-2 tablets by mouth every 4 (four) hours as needed for moderate pain (Use sparingly). What changed:  how much to take when to take this   methocarbamol 500 MG tablet Commonly known as: ROBAXIN Take 1 tablet (500 mg total) by mouth every 8 (eight) hours as needed for muscle spasms. What changed: when to take this  mirtazapine 15 MG tablet Commonly known as: REMERON Take 15 mg by mouth at bedtime.   montelukast 10 MG tablet Commonly known as: SINGULAIR Take 1 tablet (10 mg total) by mouth at bedtime.   multivitamin with minerals Tabs tablet Take 1 tablet by mouth every morning.   pantoprazole 40 MG tablet Commonly known as: PROTONIX Take 1 tablet (40 mg total) by mouth 2 (two) times daily before a meal.   potassium chloride SA 20 MEQ tablet Commonly known as: KLOR-CON M Take 1 tablet (20 mEq total) by mouth 2 (two) times daily.    predniSONE 10 MG tablet Commonly known as: DELTASONE Prednisone dosing: Take  Prednisone 40mg  (4 tabs) x 3 days, then taper to 30mg  (3 tabs) x 3 days, then 20mg  (2 tabs) x 3days, then 10mg  (1 tab) x 3days, then OFF. Dispense:  30 tabs, refills: None   rosuvastatin 5 MG tablet Commonly known as: CRESTOR Take 5 mg by mouth at bedtime.   testosterone cypionate 200 MG/ML injection Commonly known as: DEPOTESTOSTERONE CYPIONATE Inject 150 mg into the muscle every Saturday.        Follow-up Information     Azalee Course, [place] Follow up on 05/13/2023.   Specialties: Cardiology, Radiology Why: 11:20AM. Cardiology follow up Contact information: [address] [place] [address] [phone]         Gweneth Dimitri, MD. Schedule an appointment as soon as possible for a visit in 2 week(s).   Specialty: Family Medicine Why: for hospital follow-up Contact information: [address] [place] [address] [phone]         Chilton Greathouse, MD. Schedule an appointment as soon as possible for a visit in 2 week(s).   Specialty: Pulmonary Disease Why: for hospital follow-up Contact information: [address] [place] [address] [place] [address] [phone]                Discharge Exam: Ceasar Mons Weights   04/09/23 1000  Weight: 124.8 kg   S: No acute complaints, feeling better, hoping to go home today.  No fevers or chills  BP (!) 157/74 (BP Location: Right Arm)   Pulse 71   Temp 97.6 F (36.4 C) (Oral)   Resp 16   Ht 5\' 9"  (1.753 m)   Wt 124.8 kg   SpO2 96%   BMI 40.63 kg/m   Physical Exam General: Alert and oriented x 3, NAD Cardiovascular: S1 S2 clear, RRR.  Respiratory: Diminished breath sounds but overall improving Gastrointestinal: Soft, nontender, nondistended, NBS Ext: +trace pedal edema bilaterally Neuro: no new deficits Psych: Normal affect   Condition at discharge: fair  The results of significant diagnostics from this  hospitalization (including imaging, microbiology, ancillary and laboratory) are listed below for reference.   Imaging Studies: DG ESOPHAGUS W SINGLE CM (SOL OR THIN BA)  Result Date: 04/09/2023 CLINICAL DATA:  Patient with tracheobronchomalacia, cough, dizziness, history of GERD. Patient previously evaluated by speech pathology who recommended the patient undergo esophagram EXAM: ESOPHAGUS/BARIUM SWALLOW/TABLET STUDY TECHNIQUE: Single contrast examination was performed using thin liquid barium. This exam was performed by Mina Marble, PA-C , and was supervised and interpreted by Dr. Genevive Bi. FLUOROSCOPY: Radiation Exposure Index (as provided by the fluoroscopic device): 24.30 mGy Kerma COMPARISON:  None Available. FINDINGS: A limited exam was performed today due to patient debilitation and limited tolerance of positioning required. Swallowing: Appears normal. No vestibular penetration or aspiration seen. Pharynx: Unremarkable. Esophagus: Normal appearance. Esophageal motility: Severe dysmotility with tertiary  contractions, contrast stasis, and retrograde flow of contrast bolus visualized Hiatal Hernia: Small hiatal hernia Gastroesophageal reflux: None visualized due to significant retention of initial contrast bolus Ingested 13mm barium tablet: Not given Other: None. IMPRESSION: 1. Esophageal dysmotility suggestive of presbyesophagus. No stricture or mass. Limited exam. 2.  Small hiatal hernia Electronically Signed   By: Genevive Bi M.D.   On: 04/09/2023 12:06   CT Angio Chest PE W and/or Wo Contrast  Result Date: 04/07/2023 CLINICAL DATA:  Pulmonary embolism (PE) suspected, high prob EXAM: CT ANGIOGRAPHY CHEST WITH CONTRAST TECHNIQUE: Multidetector CT imaging of the chest was performed using the standard protocol during bolus administration of intravenous contrast. Multiplanar CT image reconstructions and MIPs were obtained to evaluate the vascular anatomy. RADIATION DOSE REDUCTION: This exam  was performed according to the departmental dose-optimization program which includes automated exposure control, adjustment of the mA and/or kV according to patient size and/or use of iterative reconstruction technique. CONTRAST:  75mL OMNIPAQUE IOHEXOL 350 MG/ML SOLN COMPARISON:  01/14/2023 FINDINGS: Cardiovascular: SVC patent. Heart size normal. No pericardial effusion. The RV is nondilated. Satisfactory opacification of pulmonary arteries noted, and there is no evidence of pulmonary emboli. 3-vessel coronary calcifications. Adequate contrast opacification of the thoracic aorta with no evidence of dissection, aneurysm, or stenosis. There is bovine variant brachiocephalic arch anatomy without proximal stenosis. Scattered calcified atheromatous plaque in the descending thoracic aorta. Mediastinum/Nodes: There is narrowing of the distal trachea and proximal mainstem bronchi, increased since previous. No mediastinal hematoma or pathologic adenopathy. Lungs/Pleura: Debris in central airways of the right lower lobe and left lower lobe, with some patchy bilateral lower lobe peribronchial airspace opacities and inferior airspace consolidation, increased since most recent prior scan. Upper Abdomen: Bilateral urolithiasis largest visualized stone 1.1 cm mid left kidney, without hydronephrosis. No acute findings. Musculoskeletal: Multilevel spondylitic changes throughout the visualized lower cervical and thoracic spine, with a mild thoracic dextroscoliosis. Advanced left shoulder DJD. Review of the MIP images confirms the above findings. IMPRESSION: 1. Negative for acute PE or thoracic aortic dissection. 2. Debris in the central airways of the right lower lobe and left lower lobe, with patchy bilateral lower lobe peribronchial airspace opacities and inferior airspace consolidation, increased since most recent prior scan. 3. Significant narrowing of the distal trachea and proximal mainstem bronchi consistent with  tracheobronchomalacia. 4. Bilateral urolithiasis. 5. Coronary and aortic Atherosclerosis (ICD10-I70.0). Electronically Signed   By: Corlis Leak M.D.   On: 04/07/2023 20:33   DG Chest Portable 1 View  Result Date: 04/07/2023 CLINICAL DATA:  Cough, dyspnea EXAM: PORTABLE CHEST 1 VIEW COMPARISON:  None Available. FINDINGS: The heart size and mediastinal contours are within normal limits. Both lungs are clear. The visualized skeletal structures are unremarkable. IMPRESSION: No active disease. Electronically Signed   By: Helyn Numbers M.D.   On: 04/07/2023 18:58   CT ABDOMEN PELVIS WO CONTRAST  Result Date: 03/18/2023 CLINICAL DATA:  Chronic nausea bloating EXAM: CT ABDOMEN AND PELVIS WITHOUT CONTRAST TECHNIQUE: Multidetector CT imaging of the abdomen and pelvis was performed following the standard protocol without IV contrast. RADIATION DOSE REDUCTION: This exam was performed according to the departmental dose-optimization program which includes automated exposure control, adjustment of the mA and/or kV according to patient size and/or use of iterative reconstruction technique. COMPARISON:  CT 08/08/2021 FINDINGS: Lower chest: Lung bases demonstrate no acute airspace disease. Coronary vascular calcification Hepatobiliary: No focal liver abnormality is seen. No gallstones, gallbladder wall thickening, or biliary dilatation. Pancreas: Unremarkable. No pancreatic ductal dilatation or  surrounding inflammatory changes. Spleen: Normal in size without focal abnormality. Adrenals/Urinary Tract: Adrenal glands are normal. Kidneys show no hydronephrosis. Numerous bilateral kidney stones. Stones measure up to 8 mm in size on the right. Stones on the left measure up to 10 mm. No ureteral stone. Bladder is normal Stomach/Bowel: Stomach is within normal limits. Appendix appears normal. No evidence of bowel wall thickening, distention, or inflammatory changes. Diverticular disease of the left colon. Vascular/Lymphatic: Moderate  aortic atherosclerosis. No aneurysm. No suspicious lymph nodes. Reproductive: Slightly enlarged prostate. Other: Negative for pelvic effusion or free air. Musculoskeletal: Grade 1 anterolisthesis L4 on L5. Trace retrolisthesis L1 on L2, L2 on L3 and L3 on L4. Advanced multilevel degenerative changes. IMPRESSION: 1. No CT evidence for acute intra-abdominal or pelvic abnormality. 2. Multiple bilateral kidney stones without hydronephrosis or ureteral stone. Diverticular disease of the left colon without acute inflammatory process. 3. Aortic atherosclerosis. Aortic Atherosclerosis (ICD10-I70.0). Electronically Signed   By: Jasmine Pang M.D.   On: 03/18/2023 19:41    Microbiology: Results for orders placed or performed during the hospital encounter of 04/07/23  SARS Coronavirus 2 by RT PCR (hospital order, performed in [facility] hospital lab) *cepheid single result test* Anterior Nasal Swab     Status: None   Collection Time: 04/07/23  6:15 PM   Specimen: Anterior Nasal Swab  Result Value Ref Range Status   SARS Coronavirus 2 by RT PCR NEGATIVE NEGATIVE Final    Comment: (NOTE) SARS-CoV-2 target nucleic acids are NOT DETECTED.  The SARS-CoV-2 RNA is generally detectable in upper and lower respiratory specimens during the acute phase of infection. The lowest concentration of SARS-CoV-2 viral copies this assay can detect is 250 copies / mL. A negative result does not preclude SARS-CoV-2 infection and should not be used as the sole basis for treatment or other patient management decisions.  A negative result may occur with improper specimen collection / handling, submission of specimen other than nasopharyngeal swab, presence of viral mutation(s) within the areas targeted by this assay, and inadequate number of viral copies (<250 copies / mL). A negative result must be combined with clinical observations, patient history, and epidemiological information.  Fact Sheet for Patients:    RoadLapTop.co.za  Fact Sheet for Healthcare Providers: http://kim-miller.com/  This test is not yet approved or  cleared by the [place] FDA and has been authorized for detection and/or diagnosis of SARS-CoV-2 by FDA under an Emergency Use Authorization (EUA).  This EUA will remain in effect (meaning this test can be used) for the duration of the COVID-19 declaration under Section 564(b)(1) of the Act, 21 U.S.C. section 360bbb-3(b)(1), unless the authorization is terminated or revoked sooner.  Performed at [facility], [address] [place], [place] [address]    *Note: Due to a large number of results and/or encounters for the requested time period, some results have not been displayed. A complete set of results can be found in Results Review.    Labs: CBC: Recent Labs  Lab 04/07/23 1815 04/08/23 0546  WBC 5.5 5.2  NEUTROABS 3.8  --   HGB 13.1 13.1  HCT 40.3 40.8  MCV 89.0 88.9  PLT 200 180   Basic Metabolic Panel: Recent Labs  Lab 04/07/23 1815 04/08/23 0546 04/09/23 0607 04/10/23 0624  NA 137 138 139 140  K 3.2* 4.0 3.1* 3.3*  CL 99 101 101 99  CO2 29 27 28 29   GLUCOSE 112* 142* 125* 103*  BUN 15 16 25* 25*  CREATININE 1.82* 1.70* 2.00* 2.06*  CALCIUM 8.5* 8.3* 8.3* 8.7*  MG  --   --  2.0 2.1   Liver Function Tests: Recent Labs  Lab 04/07/23 1815  AST 29  ALT 27  ALKPHOS 79  BILITOT 0.7  PROT 7.7  ALBUMIN 3.9   CBG: No results for input(s): "GLUCAP" in the last 168 hours.  Discharge time spent: greater than 30 minutes.  Signed: Thad Ranger, MD [facility] 04/10/2023

## 2023-04-10 NOTE — TOC Initial Note (Signed)
Transition of Care [facility]) - Initial/Assessment Note    Patient Details  Name: [name] MRN: [id number] Date of Birth: 1941/08/16  Transition of Care [facility]) CM/SW Contact:    Beckie Busing, RN Phone [phone]  04/10/2023, 12:18 PM  Clinical Narrative:                 Patient admitted from home with high risk for readmission. Patient is from home where he functions independently. Patient does have PCP that he follows on a regular basis. Patient states that he has access to medications and that he is compliant with his medication regimen. Patient states that he is discharging today and has no need for DME. TOC will sign off.  Expected Discharge Plan: Home/Self Care Barriers to Discharge: No Barriers Identified   Patient Goals and CMS Choice Patient states their goals for this hospitalization and ongoing recovery are:: Ready to go home CMS Medicare.gov Compare Post Acute Care list provided to::  (n/a)   [place] ownership interest in [facility].provided to::  (n/a)    Expected Discharge Plan and Services In-house Referral: NA Discharge Planning Services: NA Post Acute Care Choice: NA Living arrangements for the past 2 months: Single Family Home Expected Discharge Date: 04/10/23               DME Arranged: N/A DME Agency: NA       HH Arranged: NA HH Agency: NA        Prior Living Arrangements/Services Living arrangements for the past 2 months: Single Family Home Lives with:: Spouse Patient language and need for interpreter reviewed:: Yes Do you feel safe going back to the place where you live?: Yes      Need for Family Participation in Patient Care: Yes (Comment) Care giver support system in place?: Yes (comment) Current home services: Other (comment) (rolling walker) Criminal Activity/Legal Involvement Pertinent to Current Situation/Hospitalization: No - Comment as needed  Activities of Daily Living Home Assistive Devices/Equipment: CPAP,  Cane (specify quad or straight) ADL Screening (condition at time of admission) Patient's cognitive ability adequate to safely complete daily activities?: Yes Is the patient deaf or have difficulty hearing?: No Does the patient have difficulty seeing, even when wearing glasses/contacts?: No Does the patient have difficulty concentrating, remembering, or making decisions?: No Patient able to express need for assistance with ADLs?: Yes Does the patient have difficulty dressing or bathing?: No Independently performs ADLs?: Yes (appropriate for developmental age) Does the patient have difficulty walking or climbing stairs?: Yes Weakness of Legs: Both Weakness of Arms/Hands: None  Permission Sought/Granted Permission sought to share information with : Family Supports Permission granted to share information with : No              Emotional Assessment Appearance:: Appears stated age Attitude/Demeanor/Rapport: Gracious Affect (typically observed): Pleasant Orientation: : Oriented to Self, Oriented to  Time, Oriented to Place Alcohol / Substance Use: Not Applicable Psych Involvement: No (comment)  Admission diagnosis:  Shortness of breath [R06.02] Bronchitis [J40] Acute respiratory failure with hypoxia (HCC) [J96.01] Community acquired pneumonia, unspecified laterality [J18.9] Patient Active Problem List   Diagnosis Date Noted   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   Aspiration pneumonia (HCC) 04/07/2023   Asthma 04/07/2023   Acute respiratory failure with hypoxia (HCC) 11/18/2022   OSA (obstructive sleep apnea) 11/18/2022   Pulmonary nodules 11/18/2022   Blood in stool 11/18/2022   Influenza A with pneumonia 11/13/2022   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/2022   Primary osteoarthritis  of left hip 04/23/2022   Osteomyelitis of great toe of right foot (HCC) 04/18/2022   Left hip pain 04/09/2022   Upper airway cough syndrome 02/03/2022   Acute rhinosinusitis 01/24/2022    Hypertension 09/30/2021   Allergic rhinitis due to pollen 03/25/2021   Benign prostatic hyperplasia with lower urinary tract symptoms 03/25/2021   Chronic fatigue syndrome 03/25/2021   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   Stage 3b chronic kidney disease (CKD) (HCC) 03/25/2021   Chronic pain 03/25/2021   Dyslipidemia 03/25/2021   Edema 03/25/2021   Generalized anxiety disorder 03/25/2021   Mild persistent asthma 03/25/2021   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   Obesity (BMI 30-39.9) 03/25/2021   Non-pressure chronic ulcer of other part of right foot limited to breakdown of skin (HCC) 03/25/2021   Prediabetes 03/25/2021   Primary insomnia 03/25/2021   Sleep disturbance 03/25/2021   Paresthesia of skin 06/08/2020   Right foot drop 06/08/2020   Body mass index (BMI) 35.0-35.9, adult 03/13/2020   Spondylosis of cervical region without myelopathy or radiculopathy 01/05/2020   Elevated blood-pressure reading, without diagnosis of hypertension 12/07/2019   Spinal stenosis of lumbar region with neurogenic claudication 01/15/2017   Spondylolisthesis of lumbar region 01/15/2017   Claw toe, acquired, left 01/06/2017   Achilles tendon contracture, bilateral 01/06/2017   Pain in metatarsus of both feet 01/06/2017   Tubular adenoma of colon 12/29/2016   Basal cell carcinoma 08/19/2016   Umbilical hernia s/p lap repair with mesh 06/27/2016 06/27/2016   Degenerative arthritis of knee 03/22/2015   Dyspnea on exertion 01/31/2015   Lumbar radiculopathy 12/12/2014   Facet hypertrophy of lumbar region 12/12/2014   Left knee pain 12/12/2014   Ulcer of lower limb (HCC) 01/10/2014   Varicose veins of lower extremities with other complications 01/10/2014   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 12/21/2013   Renal insufficiency 12/21/2013   Swelling in head/neck 07/28/2012   Venous (peripheral) insufficiency 05/25/2012   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   Pain in limb  02/12/2012   Hyperlipidemia 02/28/2008   Allergic rhinitis 02/28/2008   Sleep apnea 02/28/2008   PCP:  Gweneth Dimitri, MD Pharmacy:   [facility] [id number] - [place], [place] - [address] [place] [address] Phone: [phone] Fax: [phone]     Social Determinants of Health (SDOH) Social History: SDOH Screenings   Food Insecurity: No Food Insecurity (04/08/2023)  Housing: Low Risk  (04/08/2023)  Transportation Needs: No Transportation Needs (04/08/2023)  Utilities: Not At Risk (04/08/2023)  Depression (PHQ2-9): Medium Risk (08/09/2020)  Tobacco Use: Low Risk  (04/09/2023)   SDOH Interventions:     Readmission Risk Interventions    04/10/2023   12:01 PM 11/20/2022   12:16 PM 11/14/2022    1:18 PM  Readmission Risk Prevention Plan  Transportation Screening Complete  Complete  PCP or Specialist Appt within 3-5 Days   Complete  HRI or Home Care Consult   Complete  Social Work Consult for Recovery Care Planning/Counseling   Complete  Palliative Care Screening   Not Applicable  Medication Review Oceanographer) Complete  Complete  PCP or Specialist appointment within 3-5 days of discharge Complete Complete   HRI or Home Care Consult Complete Complete   SW Recovery Care/Counseling Consult Complete Complete   Palliative Care Screening Not Applicable Not Applicable   Skilled Nursing Facility Not Applicable Not Applicable

## 2023-04-10 NOTE — Progress Notes (Signed)
   04/10/23 0818  BiPAP/CPAP/SIPAP  BiPAP/CPAP/SIPAP Pt Type Adult  BiPAP/CPAP/SIPAP DREAMSTATIOND  Mask Type Nasal mask  Mask Size Large  Respiratory Rate 16 breaths/min  EPAP 10 cmH2O (hr)  Flow Rate 4 lpm (lpm)  Patient Home Equipment No  Auto Titrate No  CPAP/SIPAP surface wiped down Yes  BiPAP/CPAP /SiPAP Vitals  Pulse Rate 71  Resp 16  SpO2 96 %  Bilateral Breath Sounds Coarse crackles;Diminished  MEWS Score/Color  MEWS Score 0  MEWS Score Color [place] Si

## 2023-04-10 NOTE — Progress Notes (Signed)
   04/10/23 1202  TOC Assessment  Once discharged, how will the patient get to their discharge location? Family/Friend - Photographer  Expected Discharge Plan Home/Self Care  Final next level of care Home/Self Care  Barriers to Discharge No Barriers Identified  Patient states their goals for this hospitalization and ongoing recovery are: Ready to go home  CMS Medicare.gov Compare Post Acute Care list provided to:  (n/a)  [place] ownership interest in [facility].provided to:  (n/a)  Post Acute Care Choice NA  Living arrangements for the past 2 months Single Family Home  Lives with: Spouse  Current home services Other (comment) (rolling walker)  In-house Referral NA  Discharge Planning Services NA  DME Arranged N/A  DME Agency NA  HH Arranged NA  HH Agency NA  Do you feel safe going back to the place where you live? Y  Permission sought to share information with  Family Supports  Permission granted to share information with  No  Patient language and need for interpreter reviewed: Yes  Criminal Activity/Legal Involvement Pertinent to Current Situation/Hospitalization No - Comment as needed  Need for Family Participation in Patient Care Y  Care giver support system in place? Y  Appearance: Appears stated age  Attitude/Demeanor/Rapport Gracious  Affect (typically observed) Pleasant  Orientation:  Oriented to Self;Oriented to  Time;Oriented to Place  Alcohol / Substance Use Not Applicable  Psych Involvement N

## 2023-05-03 ENCOUNTER — Ambulatory Visit
Admission: EM | Admit: 2023-05-03 | Discharge: 2023-05-03 | Disposition: A | Discharge status: Home / Self Care | Payer: Medicare Other | Attending: Urgent Care | Admitting: Urgent Care

## 2023-05-03 ENCOUNTER — Encounter: Payer: Self-pay | Admitting: Emergency Medicine

## 2023-05-03 DIAGNOSIS — N183 Chronic kidney disease, stage 3 unspecified: Secondary | ICD-10-CM | POA: Diagnosis not present

## 2023-05-03 DIAGNOSIS — L03116 Cellulitis of left lower limb: Secondary | ICD-10-CM | POA: Diagnosis not present

## 2023-05-03 DIAGNOSIS — I872 Venous insufficiency (chronic) (peripheral): Secondary | ICD-10-CM | POA: Diagnosis not present

## 2023-05-03 DIAGNOSIS — L03115 Cellulitis of right lower limb: Secondary | ICD-10-CM | POA: Diagnosis not present

## 2023-05-03 MED ORDER — CEPHALEXIN 500 MG PO CAPS: 500.0000 mg | ORAL_CAPSULE | Freq: Three times a day (TID) | ORAL | 0 refills | Status: DC

## 2023-05-03 NOTE — ED Triage Notes (Signed)
Pt reports bilateral leg swelling and redness. Primarily in the right leg. States he has vascular issues and has had to have an ulna boot placed on the right leg in the past.

## 2023-05-03 NOTE — ED Provider Notes (Addendum)
[facility] - URGENT CARE C[facility]  Note:  This document was prepared using Conservation officer, historic buildings and may include unintentional dictation errors.  MRN: [id number] DOB: 01/02/1941  Subjective:   [name] is a 82 y.o. male presenting for 1 day history of bilateral redness and slight tenderness of both his lower extremities, swelling of the feet with redness.  Reports longstanding history of venous insufficiency.  He just had extensive ultrasound imaging per patient.  Reports that they did not find any particular blood clots.  They do confirm that he has venous insufficiency, varicose veins.  Is supposed to wear compression socks but does not.  Does not clean his lower legs at home.  Reports that he goes to his doctor's office to have them cleaned once weekly.  He is requesting wound care including cleaning of his entire lower extremities, Unna boots.  He does have a wound care clinic that he goes to and can follow-up with his primary care readily.  No current facility-administered medications for this encounter.  Current Outpatient Medications:    albuterol (PROVENTIL) (2.5 MG/3ML) 0.083% nebulizer solution, Take 3 mLs (2.5 mg total) by nebulization every 4 (four) hours as needed for wheezing or shortness of breath., Disp: 75 mL, Rfl: 12   albuterol (VENTOLIN HFA) 108 (90 Base) MCG/ACT inhaler, Inhale 2 puffs into the lungs every 6 (six) hours as needed for wheezing or shortness of breath., Disp: 8 g, Rfl: 2   ASTEPRO 205.5 MCG/SPRAY SOLN, Place 2 sprays into both nostrils in the morning., Disp: , Rfl:    Budeson-Glycopyrrol-Formoterol (BREZTRI AEROSPHERE) 160-9-4.8 MCG/ACT AERO, Inhale 2 puffs into the lungs in the morning and at bedtime., Disp: , Rfl:    buPROPion (WELLBUTRIN XL) 150 MG 24 hr tablet, Take 450 mg by mouth every morning., Disp: , Rfl:    doxazosin (CARDURA) 8 MG tablet, TAKE 1 TABLET BY MOUTH DAILY  . Take at night (Patient taking differently: Take 8 mg by mouth  at bedtime.), Disp: 30 tablet, Rfl: 11   fluticasone (FLONASE ALLERGY RELIEF) 50 MCG/ACT nasal spray, Place 2 sprays into both nostrils every evening., Disp: , Rfl:    furosemide (LASIX) 40 MG tablet, Take 1 tablet (40 mg total) by mouth daily. Take 40 mg by mouth in the morning and an additional 40 mg once a day as needed for unresolved swelling or fluid retention, Disp: 100 tablet, Rfl: 3   gabapentin (NEURONTIN) 300 MG capsule, TAKE 1 TO 2 CAPSULES BY MOUTH AT BEDTIME (Patient taking differently: Take 600 mg by mouth at bedtime.), Disp: 60 capsule, Rfl: 0   guaiFENesin (MUCINEX) 600 MG 12 hr tablet, Take 2 tablets (1,200 mg total) by mouth 2 (two) times daily., Disp: 120 tablet, Rfl: 0   guaiFENesin-dextromethorphan (ROBITUSSIN DM) 100-10 MG/5ML syrup, Take 5 mLs by mouth every 4 (four) hours as needed for cough (chest congestion)., Disp: 118 mL, Rfl: 0   HYDROcodone-acetaminophen (NORCO) 5-325 MG tablet, Take 1-2 tablets by mouth every 4 (four) hours as needed for moderate pain (Use sparingly). (Patient taking differently: Take 1 tablet by mouth in the morning and at bedtime.), Disp: 40 tablet, Rfl: 0   methocarbamol (ROBAXIN) 500 MG tablet, Take 1 tablet (500 mg total) by mouth every 8 (eight) hours as needed for muscle spasms. (Patient taking differently: Take 500 mg by mouth in the morning and at bedtime.), Disp: 20 tablet, Rfl: 0   mirtazapine (REMERON) 15 MG tablet, Take 15 mg by mouth at bedtime., Disp: ,  Rfl:    montelukast (SINGULAIR) 10 MG tablet, Take 1 tablet (10 mg total) by mouth at bedtime., Disp: 30 tablet, Rfl: 5   Multiple Vitamin (MULTIVITAMIN WITH MINERALS) TABS tablet, Take 1 tablet by mouth every morning., Disp: , Rfl:    pantoprazole (PROTONIX) 40 MG tablet, Take 1 tablet (40 mg total) by mouth 2 (two) times daily before a meal., Disp: 60 tablet, Rfl: 3   potassium chloride SA (K-DUR,KLOR-CON) 20 MEQ tablet, Take 1 tablet (20 mEq total) by mouth 2 (two) times daily., Disp: 18  tablet, Rfl: 3   predniSONE (DELTASONE) 10 MG tablet, Prednisone dosing: Take  Prednisone 40mg  (4 tabs) x 3 days, then taper to 30mg  (3 tabs) x 3 days, then 20mg  (2 tabs) x 3days, then 10mg  (1 tab) x 3days, then OFF. 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   Hyperlipidemia    Joint pain    Neuromuscular disorder (HCC)    Pre-diabetes    Prediabetes    Sleep apnea    uses CPAP nightly   SOB (shortness of breath)    Swallowing difficulty    Swelling of lower extremity    right more than leg leg   Umbilical hernia      Past Surgical History:  Procedure Laterality Date   ABDOMINAL AORTOGRAM W/LOWER EXTREMITY N/A 03/14/2020   Procedure: ABDOMINAL AORTOGRAM W/LOWER EXTREMITY;  Surgeon: Nada Libman, MD;  Location: [facility];  Service: Vascular;  Laterality: N/A;   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   AMPUTATION TOE Right 04/18/2022   Procedure: AMPUTATION RIGHT GREAT TOE;  Surgeon: Jodi Geralds, MD;  Location: [facility];  Service: Orthopedics;  Laterality: Right;   BACK SURGERY  2003   L4, L5   BACK SURGERY      CATARACT EXTRACTION, BILATERAL     ENDOVENOUS ABLATION SAPHENOUS VEIN W/ LASER Right 06-01-2014   EVLA right greater saphenous vein by Gretta Began MD    epidural steroid injections        [facility]   hernia repair  2003   HERNIA REPAIR     hernia inguinal x 2   TOTAL HIP ARTHROPLASTY Left 05/05/2022   Procedure: LEFT TOTAL HIP ARTHROPLASTY ANTERIOR APPROACH;  Surgeon: Jodi Geralds, MD;  Location: [facility];  Service: Orthopedics;  Laterality: Left;   TOTAL KNEE ARTHROPLASTY Left 03/22/2015   Procedure: TOTAL  KNEE ARTHROPLASTY;  Surgeon: Cammy Copa, MD;  Location: [facility] OR;  Service: Orthopedics;  Laterality: Left;   UMBILICAL HERNIA REPAIR N/A 06/27/2016   Procedure: LAPAROSCOPIC ASSISTED REPAIR OF UMBILICAL HERINA WITH MESH;  Surgeon: Luretha Murphy, MD;  Location: [facility];  Service: General;  Laterality: N/A;   VASCULAR SURGERY  2015   right leg    Family History  Problem Relation Age of Onset   Thyroid disease Mother    Heart disease Father    Hypertension Father    Diabetes Maternal Grandfather    Colon cancer Neg Hx    Esophageal cancer Neg Hx     Social History   Tobacco Use   Smoking status: Never   Smokeless tobacco: Never  Vaping Use   Vaping Use: Never used  Substance Use Topics   Alcohol use: Yes    Comment: rare   Drug use: No    ROS   Objective:   Vitals: BP (!) 178/87 (BP Location: Right Arm)   Pulse 85   Temp 98 F (36.7 C) (Oral)   Resp 20   SpO2 93%   Physical Exam Constitutional:      General: He is not in acute distress.    Appearance: Normal appearance. He is well-developed and normal weight. He is not ill-appearing, toxic-appearing or diaphoretic.  HENT:     Head: Normocephalic and atraumatic.     Right Ear: External ear normal.     Left Ear: External ear normal.     Nose: Nose normal.     Mouth/Throat:     Pharynx: Oropharynx is clear.  Eyes:     General: No scleral icterus.       Right eye: No discharge.         Left eye: No discharge.     Extraocular Movements: Extraocular movements intact.  Cardiovascular:     Rate and Rhythm: Normal rate.  Pulmonary:     Effort: Pulmonary effort is normal.  Musculoskeletal:        General: Tenderness (distally, negative homan sign) present.     Cervical back: Normal range of motion.     Right lower leg: Edema present.     Left lower leg: No edema.     Comments: Has 2 weeping lesions over the posterior right lower leg.   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  Neurological:     Mental Status: He is alert and oriented to person, place, and time.  Psychiatric:        Mood and Affect: Mood normal.        Behavior: Behavior normal.        Thought Content: Thought content normal.        Judgment: Judgment normal.    Weeping lesions were cleaned using wound cleanser.  Bacitracin applied to a dressing and secured with Ace wrap's extending from the feet up to the knee bilaterally.  Assessment and Plan :   PDMP not reviewed this encounter.  1. Cellulitis of both lower extremities   2. Venous (peripheral) insufficiency   3. Stage 3 chronic kidney disease, unspecified whether stage 3a or 3b CKD (HCC)    Unfortunately I cannot perform the extensive wound care, wound cleaning, Unna boot application that patient is seeking.  Advised that we use cephalexin.  I did provide him with a strep coverage at his request as he has typically experience relief with  this.  Follow-up with his PCP as soon as possible, wound care clinic as well.  Will defer ER visit for workup of a DVT since he just had this workup.  Recheck with his PCP to see if they want to pursue recheck ultrasounds.  Otherwise start cephalexin now at 500 mg 3 times daily for 10 days.  Creatinine clearance level calculated at 47 mL/min based office his last blood work results.  Counseled patient on potential for adverse effects with  medications prescribed/recommended today, ER and return-to-clinic precautions discussed, patient verbalized understanding.     Wallis Bamberg, [place] 05/03/23 1454

## 2023-05-11 NOTE — Progress Notes (Signed)
EROL, ODAM ([id number]) [id number] Page 1 of 10 Visit Report for 03/24/2023 Chief Complaint Document Details Patient Name: [name]: [name], [name] [name] 03/24/2023 8:45 A M Medical Record Number: [id number] Patient Account Number: [id number] Date of Birth/Sex: Treating RN: October 28, 1941 (82 y.o. M) Primary Care Provider: [name] Other Clinician: Referring Provider: Treating Provider/Extender: [name] Weeks in Treatment: 3 Information Obtained from: Patient Chief Complaint 02/07/2021; patient is here for review of a wound on the tip of his right great toe 03/19/2022: re-opening of the right great toe wound, with additional wounds on medial aspect of right great toe and right anterior tibial surface 07/10/2022: Partial dehiscence of right great toe amputation site, pressure ulcer of anterior right ankle 03/02/2023; bilateral lower extremity wounds Electronic Signature(s) Signed: 03/24/2023 2:24:30 PM By: Geralyn Corwin DO Entered By: Geralyn Corwin on 03/24/2023 09:30:32 -------------------------------------------------------------------------------- HPI Details Patient Name: [name]: [name] [name] 03/24/2023 8:45 A M Medical Record Number: [id number] Patient Account Number: [id number] Date of Birth/Sex: Treating RN: 1941-04-16 (82 y.o. M) Primary Care Provider: [name] Other Clinician: Referring Provider: Treating Provider/Extender: [name] Weeks in Treatment: 3 History of Present Illness HPI Description: ADMISSION 02/07/2021 This is a 82 year old man who is not a diabetic "prediabetic" who comes in today letter asking [place] to look at an area on his right first toe tip that has been there about a year. He has been getting this dressed that [employer] and he had Xeroform on this with gauze. The patient is not exactly sure how this came about but he is not able to dress the  wound himself because of limitations due to what sounds like spinal stenosis and pain. He had a wound on the ankle as well but that healed over. He was seen by vascular in May 2021. This was related to a right great toe wound. He had an angiogram that was completely normal on both sides good perfusion right into the feet. He also looks like he has lymphedema and he has compression stockings but he cannot get them on and off so he essentially leaves he is on even when he showering Past medical history includes prediabetes, low back pain secondary I think the lumbar spinal stenosis has been offered a decompressive laminectomy but he has not gone forth with this. 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 ABI was noncompressible in our clinic on the right right first toe. 4/7; patient I admitted the clinic last week. He has an area on the tip of his right first toe. We cleaned this off last week and have been using silver collagen. He is in the same crocs today that he wore last week and I told him not to use. For 1 reason or another he could not handle a surgical sandal. 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 He is not a diabetic 4/12; right first toe wound chronic. We have been using silver collagen. Once again he comes in then the same pair of old crocs that he comes in every week. 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 His x-ray suggested suspicious for osteomyelitis with bone irregularity at the tuft of the right great toe 4/21; patient has been using silver collagen every other day to the wound on his right great toe.  He continues to wear crocs. He has no complaints or issues today. 5/5; 2-week follow-up. 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 Mild soft tissue edema with [name], [name]  ([id number]) [id number] Page 2 of 10 enhancement at the distal aspect of the great toe suggestive of cellulitis. Fortunately the wound actually looks better. Has been using Hydrofera Blue 5/12; started him on Augmentin last week 875 twice daily he is tolerating this well. Last creatinine I see is 1.77 in early April. We are using Hydrofera Blue to the wound which actually looks better today. 5/17; tolerating the Augmentin well. He says the wound is actually been hurting him episodically. Actually the surface of this looks quite good. He did not get his lab work done. We are using [facility] he is changing this himself 6/2; still taking Augmentin he should be rounding into his last week. This was empirically for underlying osteomyelitis. The area on the tip of his toe is still open still with some depth but no palpable bone. We have been using Hydrofera Blue. Surface area of the wound has not been making much improvement Is work was essentially unremarkable except for a creatinine of 1.79. Reason for his renal insufficiency is not clear he is not a diabetic does not have hypertension however he does take NSAIDs and Lasix I wonder if this is the issue here. Note that his creatinine on 11/12/2018 was 1.45. Platelet count slightly low at 143 however his inflammatory markers were really in the normal range at 10 and 2 sedimentation rate and C-reactive protein respectively. 6/9; he apparently had another 2 weeks of Augmentin after we look that this last time. Therefore he should be coming into his last week next week. We are using polymen on the tip of the toe. He cannot get down to do the dressing so he comes in for a nurse visit. He comes in in crocs I have warned him against this although he says he is using a different shoe for most of the day he works in [employer]. 6/16; he comes back in in crocs. I have spoken him about this before. His areas on the tip of his toe this  actually looks quite good we have been using polymen. He has completed his antibiotics which we empirically gave him for underlying osteomyelitis. Everything looks better to me in spite of his noncompliance with footwear recommendation 7/11; patient has been followed through nurse visits. He was last seen by provider on 6/16. He reports improvement to the wound with the use of PolyMem. He denies signs of infection. 7/21; the patient arrives with the area on the tip of his right great toe completely healed. He has the same proximal on that I see every time. I treated him empirically with Augmentin for osteomyelitis. He needs to be vigilant about this. READMISSION 03/19/2022 The patient returns with a reopening of the right great toe wound. Apparently this occurred in around October of last year. He was followed by podiatry for some time. They have performed x-rays but unfortunately, we do not have access to them or the results. No report as to whether or not he might have osteomyelitis. In addition, he has a new wound on the medial aspect of the right great toe as well as the anterior tibial surface of his right lower leg. He does state that he was wearing compression stockings when this tibial wound occurred. He continues to wear  crocs. 04/02/2022: For some reason, the patient has not been seen in 2 weeks. He says that he had to reschedule his visit. Therefore his Coflex compression wrap was left in situ for 14 days. It appears that his skin became macerated and he has opened up several new superficial wounds on his right anterior tibial surface. We did get an x-ray to evaluate for osteomyelitis and none was seen. All of the pre-existing wounds are smaller today and the new wounds look like they are limited to just the breakdown of skin. 04/10/2022: The wounds on his right anterior tibial surface are actually a little bit larger today. He seems to have some skin irritation and maceration in the  area again. The tip of his toe is nearly closed as is the wound on the dorsal surface of his right great toe. READMISSION 07/10/2022 Since our last visit, the patient went to the operating room to have a left hip replacement, but in the preop holding area, his surgeon noted a foul odor coming from his right foot. He contacted me and described the findings of his right great toe and we mutually agreed, along with the patient, that amputation would be most appropriate. 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 Since his surgery, he has been wearing compression stockings bilaterally, but as he is unable to apply or remove them himself, he wears them for a week at a time. This appears to have resulted in a pressure injury on his right anterior ankle at the point of flexion; he has a similar looking abrasion that has not broken the skin in the same site on the left. 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 07/21/2022: The right anterior ankle wound is nearly closed aside from a small open area that has some slough accumulation. The amputation site has crusting and eschar present. Once this was removed, it was revealed that the amputation site has healed. The abrasion on the left anterior ankle at the point of flexion is no longer present. 07/28/2022: The patient dropped a door on his leg and has a new wound to his right anterior tibial surface. There is some nonviable skin hanging from the cranial portion of the wound. The fat layer is exposed. The right anterior ankle wound is a little bit smaller and has some accumulated slough present. 08/04/2022: The wound on his right anterior tibial surface is a little bit smaller today. There is still slough accumulation. The right anterior ankle wound is quite a bit smaller today and also has slough buildup on the surface. 08/13/2022: The patient dropped another object that  struck him in the left anterior tibial surface. This has resulted in wound at this location. It is fairly superficial but does involve the fat layer. The right anterior tibial surface wound has a little bit of eschar on the surface and underneath, it is quite small. The right anterior ankle wound has also contracted but does have slough and eschar buildup as well. 08/21/2022: The right anterior tibial wound has healed. The left anterior tibial wound is quite a bit smaller and more superficial with just a little eschar on the surface. The right anterior ankle wound also continues to contract but continues to accumulate slough and eschar, as well. 08/27/2022: The left anterior tibial wound is closed. The right anterior ankle wound is considerably smaller with just a little eschar and slough buildup. 09/03/2022: His  wounds are all healed. 03/02/2023 [name] is an 82 year old male with a past medical history of venous insufficiency that presents to clinic for a 1 month history of nonhealing wounds to his lower extremities bilaterally. He states that these occurred spontaneously. He was seen by vein and vascular who placed an Unna boot on the right and a compression stocking on the left. He has had this on for the past week. Prior to this he has compression stockings on that he leaves in place for weeks at a time. It is hard for him to take these on and off. He currently denies signs of infection. Unclear if the dressing has been used to the wound beds. 4/29; patient presents for follow-up. We have been using silver alginate with antibiotic ointment under 3 layer compression to the lower extremities bilaterally. The wounds are smaller today. 5/14; patient presents for follow-up. We have been using silver alginate with antibiotic ointment under 3 layer compression to the lower extremities bilaterally. Patient's wounds have healed. He did not bring his compression stockings but has them at  home. Electronic Signature(s) Signed: 03/24/2023 2:24:30 PM By: Geralyn Corwin DO Entered By: Geralyn Corwin on 03/24/2023 09:32:01 [name] ([id number]) [id number] Page 3 of 10 -------------------------------------------------------------------------------- Physical Exam Details Patient Name: [name]: MARTIS, DOMMER [name] 03/24/2023 8:45 A M Medical Record Number: [id number] Patient Account Number: [id number] Date of Birth/Sex: Treating RN: December 19, 1940 (82 y.o. M) Primary Care Provider: [name] Other Clinician: Referring Provider: Treating Provider/Extender: [name] Weeks in Treatment: 3 Constitutional respirations regular, non-labored and within target range for patient.. Cardiovascular 2+ dorsalis pedis/posterior tibialis pulses. Psychiatric pleasant and cooperative. Notes Epithelization to the previous wound sites on the lower extremities bilaterally. Excellent edema control. Electronic Signature(s) Signed: 03/24/2023 2:24:30 PM By: Geralyn Corwin DO Entered By: Geralyn Corwin on 03/24/2023 09:32:21 -------------------------------------------------------------------------------- Physician Orders Details Patient Name: [name]: [name] [name] 03/24/2023 8:45 A M Medical Record Number: [id number] Patient Account Number: [id number] Date of Birth/Sex: Treating RN: 10/23/1941 (82 y.o. [name] Primary Care Provider: [name] Other Clinician: Referring Provider: Treating Provider/Extender: [name] Weeks in Treatment: 3 Verbal / Phone Orders: No Diagnosis Coding Discharge From [facility]charge from [facility] - purchase own compression socks. Once you have the compression socks, bring them to the office and we will put the socks on for you Bathing/ Shower/ Hygiene May shower and wash wound with soap and water. Edema Control -  Lymphedema / SCD / Other Bilateral Lower Extremities Elevate legs to the level of the heart or above for 30 minutes daily and/or when sitting for 3-4 times a day throughout the day. Avoid standing for long periods of time. Patient to wear own compression stockings every day. Moisturize legs daily. Consults Podiatry - Refer to [facility] for Toenail paring Electronic Signature(s) Signed: 03/24/2023 2:24:30 PM By: Geralyn Corwin DO Entered By: Geralyn Corwin on 03/24/2023 09:32:27 [name] ([id number]) [id number] Page 4 of 10 Prescription 03/24/2023 -------------------------------------------------------------------------------- [name]. Geralyn Corwin DO Patient Name: Provider: February 19, 1941 [id number] Date of Birth: NPI#: Judie Petit [id number] Sex: DEA #: [phone] Phone #: License #: UPN: Patient Address: [address] Eligha Bridegroom [facility] [place], [place] [address] [address] [place], [place] [address] [phone] Allergies No Known Allergies Provider's Orders Podiatry - Refer to [facility] for Toenail paring Hand  Signature: Date(s): Electronic Signature(s) Signed: 03/24/2023 2:24:30 PM By: Geralyn Corwin DO Entered By: Geralyn Corwin on 03/24/2023 09:32:27 -------------------------------------------------------------------------------- Problem List Details Patient Name: [name]: [name] [name] 03/24/2023 8:45 A M Medical Record Number: [id number] Patient Account Number: [id number] Date of Birth/Sex: Treating RN: [birth year] (82 y.o. M) Primary Care Provider: [name] Other Clinician: Referring Provider: Treating Provider/Extender: [name] Weeks in Treatment: 3 Active Problems ICD-10 Encounter Code Description Active Date MDM Diagnosis [phone] Non-pressure chronic ulcer of other part of right lower leg  with fat layer 03/02/2023 No Yes exposed L97.822 Non-pressure chronic ulcer of other part of left lower leg with fat layer exposed4/22/2024 No Yes I87.313 Chronic venous hypertension (idiopathic) with ulcer of bilateral lower extremity 03/02/2023 No Yes N18.30 Chronic kidney disease, stage 3 unspecified 03/02/2023 No Yes CONWELL, VITO ([id number]) [id number] Page 5 of 10 Inactive Problems Resolved Problems Electronic Signature(s) Signed: 03/24/2023 2:24:30 PM By: Geralyn Corwin DO Entered By: Geralyn Corwin on 03/24/2023 09:30:16 -------------------------------------------------------------------------------- Progress Note Details Patient Name: [name]: [name] [name] 03/24/2023 8:45 A M Medical Record Number: [id number] Patient Account Number: [id number] Date of Birth/Sex: Treating RN: 1941-08-24 (82 y.o. M) Primary Care Provider: [name] Other Clinician: Referring Provider: Treating Provider/Extender: [name] Weeks in Treatment: 3 Subjective Chief Complaint Information obtained from Patient 02/07/2021; patient is here for review of a wound on the tip of his right great toe 03/19/2022: re-opening of the right great toe wound, with additional wounds on medial aspect of right great toe and right anterior tibial surface 07/10/2022: Partial dehiscence of right great toe amputation site, pressure ulcer of anterior right ankle 03/02/2023; bilateral lower extremity wounds History of Present Illness (HPI) ADMISSION 02/07/2021 This is a 82 year old man who is not a diabetic "prediabetic" who comes in today letter asking [place] to look at an area on his right first toe tip that has been there about a year. He has been getting this dressed that [employer] and he had Xeroform on this with gauze. The patient is not exactly sure how this came about but he is not able to dress the wound himself because of limitations due to what sounds  like spinal stenosis and pain. He had a wound on the ankle as well but that healed over. He was seen by vascular in May 2021. This was related to a right great toe wound. He had an angiogram that was completely normal on both sides good perfusion right into the feet. He also looks like he has lymphedema and he has compression stockings but he cannot get them on and off so he essentially leaves he is on even when he showering Past medical history includes prediabetes, low back pain secondary I think the lumbar spinal stenosis has been offered a decompressive laminectomy but he has not gone forth with this. 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 ABI was noncompressible in our clinic on the right right first toe. 4/7; patient I admitted the clinic last week. He has an area on the tip of his right first toe. We cleaned this off last week and have been using silver collagen. He is in the same crocs today that he wore last week and I told him not to use. For 1 reason or another he could not handle a surgical sandal. He is active on his feet moving his business [  [employer]]. He is not a diabetic 4/12; right first toe wound chronic. We have been using silver collagen. Once again he comes in then the same pair of old crocs that he comes in every week. 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 His x-ray suggested suspicious for osteomyelitis with bone irregularity at the tuft of the right great toe 4/21; patient has been using silver collagen every other day to the wound on his right great toe. He continues to wear crocs. He has no complaints or issues today. 5/5; 2-week follow-up. 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 Mild soft tissue edema with enhancement at the distal aspect of the great toe suggestive of cellulitis.  Fortunately the wound actually looks better. Has been using Hydrofera Blue 5/12; started him on Augmentin last week 875 twice daily he is tolerating this well. Last creatinine I see is 1.77 in early April. We are using Hydrofera Blue to the wound which actually looks better today. 5/17; tolerating the Augmentin well. He says the wound is actually been hurting him episodically. Actually the surface of this looks quite good. He did not get his lab work done. We are using [facility] he is changing this himself 6/2; still taking Augmentin he should be rounding into his last week. This was empirically for underlying osteomyelitis. The area on the tip of his toe is still open still with some depth but no palpable bone. We have been using Hydrofera Blue. Surface area of the wound has not been making much improvement Is work was essentially unremarkable except for a creatinine of 1.79. Reason for his renal insufficiency is not clear he is not a diabetic does not have hypertension however he does take NSAIDs and Lasix I wonder if this is the issue here. Note that his creatinine on 11/12/2018 was 1.45. Platelet count slightly low at 143 however his inflammatory markers were really in the normal range at 10 and 2 sedimentation rate and C-reactive protein respectively. 6/9; he apparently had another 2 weeks of Augmentin after we look that this last time. Therefore he should be coming into his last week next week. We are using polymen on the tip of the toe. He cannot get down to do the dressing so he comes in for a nurse visit. He comes in in crocs I have warned him against this although he says he is using a different shoe for most of the day he works in [employer]. 6/16; he comes back in in crocs. I have spoken him about this before. His areas on the tip of his toe this actually looks quite good we have been using polymen. He has completed his antibiotics which we empirically gave him for underlying  osteomyelitis. Everything looks better to me in spite of his noncompliance with footwear recommendation 7/11; patient has been followed through nurse visits. He was last seen by provider on 6/16. He reports improvement to the wound with the use of PolyMem. He denies signs of infection. 7/21; the patient arrives with the area on the tip of his right great toe completely healed. He has the same proximal on that I see every time. I treated him empirically with Augmentin for osteomyelitis. He needs to be vigilant about this. READMISSION 03/19/2022 [name], [name] ([id number]) [id number] Page 6 of 10 The patient returns with a reopening of the right great toe wound.  Apparently this occurred in around October of last year. He was followed by podiatry for some time. They have performed x-rays but unfortunately, we do not have access to them or the results. No report as to whether or not he might have osteomyelitis. In addition, he has a new wound on the medial aspect of the right great toe as well as the anterior tibial surface of his right lower leg. He does state that he was wearing compression stockings when this tibial wound occurred. He continues to wear crocs. 04/02/2022: For some reason, the patient has not been seen in 2 weeks. He says that he had to reschedule his visit. Therefore his Coflex compression wrap was left in situ for 14 days. It appears that his skin became macerated and he has opened up several new superficial wounds on his right anterior tibial surface. We did get an x-ray to evaluate for osteomyelitis and none was seen. All of the pre-existing wounds are smaller today and the new wounds look like they are limited to just the breakdown of skin. 04/10/2022: The wounds on his right anterior tibial surface are actually a little bit larger today. He seems to have some skin irritation and maceration in the area again. The tip of his toe is nearly closed as is the  wound on the dorsal surface of his right great toe. READMISSION 07/10/2022 Since our last visit, the patient went to the operating room to have a left hip replacement, but in the preop holding area, his surgeon noted a foul odor coming from his right foot. He contacted me and described the findings of his right great toe and we mutually agreed, along with the patient, that amputation would be most appropriate. 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 Since his surgery, he has been wearing compression stockings bilaterally, but as he is unable to apply or remove them himself, he wears them for a week at a time. This appears to have resulted in a pressure injury on his right anterior ankle at the point of flexion; he has a similar looking abrasion that has not broken the skin in the same site on the left. 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 07/21/2022: The right anterior ankle wound is nearly closed aside from a small open area that has some slough accumulation. The amputation site has crusting and eschar present. Once this was removed, it was revealed that the amputation site has healed. The abrasion on the left anterior ankle at the point of flexion is no longer present. 07/28/2022: The patient dropped a door on his leg and has a new wound to his right anterior tibial surface. There is some nonviable skin hanging from the cranial portion of the wound. The fat layer is exposed. The right anterior ankle wound is a little bit smaller and has some accumulated slough present. 08/04/2022: The wound on his right anterior tibial surface is a little bit smaller today. There is still slough accumulation. The right anterior ankle wound is quite a bit smaller today and also has slough buildup on the surface. 08/13/2022: The patient dropped another object that struck him in the left anterior tibial surface. This has  resulted in wound at this location. It is fairly superficial but does involve the fat layer. The right anterior tibial surface wound has a little bit of eschar on the surface and underneath, it is quite small.  The right anterior ankle wound has also contracted but does have slough and eschar buildup as well. 08/21/2022: The right anterior tibial wound has healed. The left anterior tibial wound is quite a bit smaller and more superficial with just a little eschar on the surface. The right anterior ankle wound also continues to contract but continues to accumulate slough and eschar, as well. 08/27/2022: The left anterior tibial wound is closed. The right anterior ankle wound is considerably smaller with just a little eschar and slough buildup. 09/03/2022: His wounds are all healed. 03/02/2023 [name] is an 82 year old male with a past medical history of venous insufficiency that presents to clinic for a 1 month history of nonhealing wounds to his lower extremities bilaterally. He states that these occurred spontaneously. He was seen by vein and vascular who placed an Unna boot on the right and a compression stocking on the left. He has had this on for the past week. Prior to this he has compression stockings on that he leaves in place for weeks at a time. It is hard for him to take these on and off. He currently denies signs of infection. Unclear if the dressing has been used to the wound beds. 4/29; patient presents for follow-up. We have been using silver alginate with antibiotic ointment under 3 layer compression to the lower extremities bilaterally. The wounds are smaller today. 5/14; patient presents for follow-up. We have been using silver alginate with antibiotic ointment under 3 layer compression to the lower extremities bilaterally. Patient's wounds have healed. He did not bring his compression stockings but has them at home. Patient History Information obtained from Patient. Family  History Diabetes - Maternal Grandparents, Heart Disease - Father, Hypertension - Father, Stroke - Father, Thyroid Problems - Mother,Child, No family history of Cancer, Hereditary Spherocytosis, Kidney Disease, Lung Disease, Seizures, Tuberculosis. Social History Never smoker, Marital Status - Single, Alcohol Use - Rarely, Drug Use - No History, Caffeine Use - Daily - coffee. Medical History Eyes Patient has history of Cataracts - 2019 Denies history of Glaucoma, Optic Neuritis Ear/Nose/Mouth/Throat Patient has history of Chronic sinus problems/congestion Denies history of Middle ear problems Hematologic/Lymphatic Denies history of Anemia, Hemophilia, Human Immunodeficiency Virus, Lymphedema, Sickle Cell Disease Respiratory Patient has history of Asthma, Sleep Apnea Denies history of Aspiration, Chronic Obstructive Pulmonary Disease (COPD), Pneumothorax, Tuberculosis Cardiovascular Patient has history of Hypertension, Peripheral Venous Disease Denies history of Angina, Arrhythmia, Congestive Heart Failure, Coronary Artery Disease, Deep Vein Thrombosis, Hypotension, Myocardial Infarction, Peripheral Arterial Disease, Phlebitis, Vasculitis Gastrointestinal Denies history of Cirrhosis , Colitis, Crohns, Hepatitis A, Hepatitis B, Hepatitis C Endocrine Denies history of Type I Diabetes, Type II Diabetes Genitourinary Denies history of End Stage Renal Disease Immunological Denies history of Lupus Erythematosus, Raynauds, Scleroderma Integumentary (Skin) Denies history of History of Burn [name], [name] ([id number]) [id number] Page 7 of 10 Musculoskeletal Patient has history of Osteoarthritis Denies history of Gout, Rheumatoid Arthritis, Osteomyelitis Neurologic Patient has history of Neuropathy Denies history of Dementia, Quadriplegia, Paraplegia, Seizure Disorder Oncologic Denies history of Received Chemotherapy, Received Radiation Psychiatric Denies  history of Anorexia/bulimia, Confinement Anxiety Hospitalization/Surgery History - right great toe amputation 2023. - right hip replacement 2023. - left knee replacement. - RSV jan 2024. Medical A Surgical History Notes nd Gastrointestinal GERD Genitourinary BPH Musculoskeletal Degenerative disc disease Neurologic Neuromuscular disorder Objective Constitutional respirations regular, non-labored and within target range for patient.. Vitals Time Taken: 9:05 AM, Height: 70 in, Weight: 250 lbs, BMI: 35.9, Temperature: 98.0 F, Pulse: 79  bpm, Respiratory Rate: 22 breaths/min, Blood Pressure: 156/73 mmHg. Cardiovascular 2+ dorsalis pedis/posterior tibialis pulses. Psychiatric pleasant and cooperative. General Notes: Epithelization to the previous wound sites on the lower extremities bilaterally. Excellent edema control. Integumentary (Hair, Skin) Wound #11 status is Open. Original cause of wound was Blister. The date acquired was: 02/16/2023. The wound has been in treatment 3 weeks. The wound is located on the Left,Anterior Lower Leg. The wound measures 0cm length x 0cm width x 0cm depth; 0cm^2 area and 0cm^3 volume. There is no tunneling or undermining noted. There is a medium amount of serosanguineous drainage noted. There is no granulation within the wound bed. There is no necrotic tissue within the wound bed. The periwound skin appearance did not exhibit: Callus, Crepitus, Excoriation, Induration, Rash, Scarring, Dry/Scaly, Maceration, Atrophie Blanche, Cyanosis, Ecchymosis, Hemosiderin Staining, Mottled, Pallor, Rubor, Erythema. Wound #13 status is Open. Original cause of wound was Blister. The date acquired was: 02/16/2023. The wound has been in treatment 3 weeks. The wound is located on the Right,Distal,Anterior Lower Leg. The wound measures 0cm length x 0cm width x 0cm depth; 0cm^2 area and 0cm^3 volume. There is Fat Layer (Subcutaneous Tissue) exposed. There is no tunneling or undermining  noted. There is a medium amount of serosanguineous drainage noted. There is large (67- 100%) red, pink granulation within the wound bed. There is no necrotic tissue within the wound bed. The periwound skin appearance exhibited: Dry/Scaly. The periwound skin appearance did not exhibit: Callus, Crepitus, Excoriation, Induration, Rash, Scarring, Maceration, Atrophie Blanche, Cyanosis, Ecchymosis, Hemosiderin Staining, Mottled, Pallor, Rubor, Erythema. 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 His wounds have healed. I recommended he wear compression stockings daily. When he was initially seen this admission he had significant edema to his lower extremities bilaterally. Currently his legs have excellent edema control. He has chronic kidney disease, morbid obesity and likely pulmonary hypertension as he has OSA on CPAP. These are likely the underlying conditions exacerbating his edema. He does have venous insufficiency and has had ablations done by [facility]. He follows closely with Cardiology and had an Echo with grade 1 diastolic dysfunction. Unfortunately if he cannot control his lower extremity edema with diuretics, cpap compliance and compression stockings he will chronically have wounds. I recommended he follow-up with his primary care physician to discuss his diuretic regimen. He is understanding of this. [name], [name] ([id number]) [id number] Page 8 of 10 Plan Discharge From [facility]: Discharge from Wound Care Center - purchase own compression socks. Once you have the compression socks, bring  them to the office and we will put the socks on for you Bathing/ Shower/ Hygiene: May shower and wash wound with soap and water. Edema Control - Lymphedema / SCD / Other: Elevate legs to the level of the heart or above for 30 minutes daily and/or when sitting for 3-4 times a day throughout the day. Avoid standing for long periods of time. Patient to wear own compression stockings every day. Moisturize legs daily. Consults ordered were: Podiatry - Refer to [facility] for Toenail paring 1. Discharge from clinic due to closed wound 2. Compression stockings daily 3. Follow-up as needed Electronic Signature(s) Signed: 05/11/2023 2:12:56 PM  By: Pearletha Alfred Signed: 05/11/2023 2:57:08 PM By: Geralyn Corwin DO Previous Signature: 03/24/2023 2:24:30 PM Version By: Geralyn Corwin DO Entered By: Pearletha Alfred on 05/11/2023 14:12:56 -------------------------------------------------------------------------------- HxROS Details Patient Name: [name]: [name] [name] 03/24/2023 8:45 A M Medical Record Number: [id number] Patient Account Number: [id number] Date of Birth/Sex: Treating RN: 17-Oct-1941 (82 y.o. M) Primary Care Provider: [name] Other Clinician: Referring Provider: Treating Provider/Extender: [name] Weeks in Treatment: 3 Information Obtained From Patient Eyes Medical History: Positive for: Cataracts - 2019 Negative for: Glaucoma; Optic Neuritis Ear/Nose/Mouth/Throat Medical History: Positive for: Chronic sinus problems/congestion Negative for: Middle ear problems Hematologic/Lymphatic Medical History: Negative for: Anemia; Hemophilia; Human Immunodeficiency Virus; Lymphedema; Sickle Cell Disease Respiratory Medical History: Positive for: Asthma; Sleep Apnea Negative for: Aspiration; Chronic Obstructive Pulmonary Disease (COPD); Pneumothorax; Tuberculosis Cardiovascular Medical History: Positive for: Hypertension;  Peripheral Venous Disease Negative for: Angina; Arrhythmia; Congestive Heart Failure; Coronary Artery Disease; Deep Vein Thrombosis; Hypotension; Myocardial Infarction; Peripheral Arterial Disease; Phlebitis; Vasculitis MOHD., [name] ([id number]) [id number] Page 9 of 10 Gastrointestinal Medical History: Negative for: Cirrhosis ; Colitis; Crohns; Hepatitis A; Hepatitis B; Hepatitis C Past Medical History Notes: GERD Endocrine Medical History: Negative for: Type I Diabetes; Type II Diabetes Genitourinary Medical History: Negative for: End Stage Renal Disease Past Medical History Notes: BPH Immunological Medical History: Negative for: Lupus Erythematosus; Raynauds; Scleroderma Integumentary (Skin) Medical History: Negative for: History of Burn Musculoskeletal Medical History: Positive for: Osteoarthritis Negative for: Gout; Rheumatoid Arthritis; Osteomyelitis Past Medical History Notes: Degenerative disc disease Neurologic Medical History: Positive for: Neuropathy Negative for: Dementia; Quadriplegia; Paraplegia; Seizure Disorder Past Medical History Notes: Neuromuscular disorder Oncologic Medical History: Negative for: Received Chemotherapy; Received Radiation Psychiatric Medical History: Negative for: Anorexia/bulimia; Confinement Anxiety HBO Extended History Items Ear/Nose/Mouth/Throat: Eyes: Chronic sinus Cataracts problems/congestion Immunizations Pneumococcal Vaccine: Received Pneumococcal Vaccination: Yes Received Pneumococcal Vaccination On or After 60th Birthday: No Implantable Devices Yes Hospitalization / Surgery History Type of Hospitalization/Surgery 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 left knee replacement RSV jan 2024 Family and Social History Cancer: No; Diabetes: Yes - Maternal Grandparents; Heart Disease: Yes - Father; Hereditary Spherocytosis: No; Hypertension: Yes - Father;  Kidney Disease: No; Lung Disease: No; Seizures: No; Stroke: Yes - Father; Thyroid Problems: Yes - Mother,Child; Tuberculosis: No; Never smoker; Marital Status - [name], [name] ([id number]) [id number] Page 10 of 10 Single; Alcohol Use: Rarely; Drug Use: No History; Caffeine Use: Daily - coffee; Financial Concerns: No; Food, Clothing or Shelter Needs: No; Support System Lacking: No; Transportation Concerns: No Electronic Signature(s) Signed: 03/24/2023 2:24:30 PM By: Geralyn Corwin DO Entered By: Geralyn Corwin on 03/24/2023 09:32:14 -------------------------------------------------------------------------------- SuperBill Details Patient Name: [name]: [name] [name] 03/24/2023 Medical Record Number: [id number] Patient Account Number: [id number] Date of Birth/Sex: Treating RN: [birth year] (82 y.o. M) Primary Care Provider: [name] Other Clinician: Referring Provider: Treating Provider/Extender: [name] Weeks in Treatment: 3 Diagnosis Coding ICD-10 Codes Code Description [id number] Non-pressure chronic ulcer of other part of right lower leg with fat layer exposed L97.822 Non-pressure chronic ulcer of other part of left lower leg with fat layer exposed I87.313 Chronic venous hypertension (idiopathic) with ulcer of bilateral lower extremity N18.30 Chronic kidney disease, stage 3 unspecified Facility Procedures : CPT4 Code: [id number] Description: 99213 - WOUND CARE VISIT-LEV 3 EST PT Modifier: Quantity: 1 Physician Procedures : CPT4 Code Description Modifier [id number] 99213 - WC PHYS LEVEL 3 - EST PT ICD-10 Diagnosis Description L97.812  Non-pressure chronic ulcer of other part of right lower leg with fat layer exposed L97.822 Non-pressure chronic ulcer of other part of left  lower leg with fat layer exposed I87.313 Chronic venous hypertension (idiopathic) with ulcer of bilateral lower extremity N18.30 Chronic kidney  disease, stage 3 unspecified Quantity: 1 Electronic Signature(s) Signed: 03/24/2023 2:24:30 PM By: Geralyn Corwin DO Signed: 03/24/2023 4:28:59 PM By: Redmond Pulling RN, BSN Entered By: Redmond Pulling on 03/24/2023 10:50:20

## 2023-05-13 ENCOUNTER — Ambulatory Visit: Payer: Medicare Other | Attending: Physician Assistant | Admitting: Physician Assistant

## 2023-05-14 NOTE — Progress Notes (Signed)
This encounter was created in error - please disregard.

## 2023-05-19 DIAGNOSIS — I890 Lymphedema, not elsewhere classified: Secondary | ICD-10-CM | POA: Diagnosis not present

## 2023-05-22 DIAGNOSIS — I890 Lymphedema, not elsewhere classified: Secondary | ICD-10-CM | POA: Diagnosis not present

## 2023-06-01 DIAGNOSIS — I890 Lymphedema, not elsewhere classified: Secondary | ICD-10-CM | POA: Diagnosis not present

## 2023-06-02 DIAGNOSIS — I890 Lymphedema, not elsewhere classified: Secondary | ICD-10-CM | POA: Diagnosis not present

## 2023-06-29 DIAGNOSIS — I890 Lymphedema, not elsewhere classified: Secondary | ICD-10-CM | POA: Diagnosis not present

## 2023-07-01 DIAGNOSIS — L309 Dermatitis, unspecified: Secondary | ICD-10-CM | POA: Diagnosis not present

## 2023-07-01 DIAGNOSIS — L089 Local infection of the skin and subcutaneous tissue, unspecified: Secondary | ICD-10-CM | POA: Diagnosis not present

## 2023-07-02 DIAGNOSIS — I890 Lymphedema, not elsewhere classified: Secondary | ICD-10-CM | POA: Diagnosis not present

## 2023-07-07 ENCOUNTER — Other Ambulatory Visit: Payer: Self-pay

## 2023-07-07 ENCOUNTER — Emergency Department (HOSPITAL_BASED_OUTPATIENT_CLINIC_OR_DEPARTMENT_OTHER)
Admission: EM | Admit: 2023-07-07 | Discharge: 2023-07-07 | Disposition: A | Discharge status: Home / Self Care | Payer: Medicare Other | Attending: Emergency Medicine | Admitting: Emergency Medicine

## 2023-07-07 ENCOUNTER — Ambulatory Visit (HOSPITAL_COMMUNITY)
Admission: EM | Admit: 2023-07-07 | Discharge: 2023-07-07 | Disposition: A | Discharge status: Home / Self Care | Payer: Medicare Other

## 2023-07-07 ENCOUNTER — Encounter (HOSPITAL_COMMUNITY): Payer: Self-pay

## 2023-07-07 ENCOUNTER — Encounter (HOSPITAL_BASED_OUTPATIENT_CLINIC_OR_DEPARTMENT_OTHER): Payer: Self-pay

## 2023-07-07 ENCOUNTER — Emergency Department (HOSPITAL_BASED_OUTPATIENT_CLINIC_OR_DEPARTMENT_OTHER): Payer: Medicare Other

## 2023-07-07 DIAGNOSIS — S199XXA Unspecified injury of neck, initial encounter: Secondary | ICD-10-CM | POA: Diagnosis not present

## 2023-07-07 DIAGNOSIS — S0990XA Unspecified injury of head, initial encounter: Secondary | ICD-10-CM | POA: Diagnosis not present

## 2023-07-07 DIAGNOSIS — Y9259 Other trade areas as the place of occurrence of the external cause: Secondary | ICD-10-CM | POA: Insufficient documentation

## 2023-07-07 DIAGNOSIS — S0101XA Laceration without foreign body of scalp, initial encounter: Secondary | ICD-10-CM

## 2023-07-07 DIAGNOSIS — W208XXA Other cause of strike by thrown, projected or falling object, initial encounter: Secondary | ICD-10-CM | POA: Insufficient documentation

## 2023-07-07 DIAGNOSIS — Z23 Encounter for immunization: Secondary | ICD-10-CM | POA: Diagnosis not present

## 2023-07-07 DIAGNOSIS — S0001XA Abrasion of scalp, initial encounter: Secondary | ICD-10-CM | POA: Diagnosis not present

## 2023-07-07 DIAGNOSIS — J398 Other specified diseases of upper respiratory tract: Secondary | ICD-10-CM | POA: Diagnosis not present

## 2023-07-07 DIAGNOSIS — I6782 Cerebral ischemia: Secondary | ICD-10-CM | POA: Diagnosis not present

## 2023-07-07 DIAGNOSIS — S0081XA Abrasion of other part of head, initial encounter: Secondary | ICD-10-CM | POA: Insufficient documentation

## 2023-07-07 MED ORDER — HYDROCODONE-ACETAMINOPHEN 5-325 MG PO TABS
2.0000 | ORAL_TABLET | Freq: Once | ORAL | Status: AC
  Administered 2023-07-07: 2 via ORAL
  Filled 2023-07-07: qty 2

## 2023-07-07 MED ORDER — TETANUS-DIPHTH-ACELL PERTUSSIS 5-2.5-18.5 LF-MCG/0.5 IM SUSY
0.5000 mL | PREFILLED_SYRINGE | Freq: Once | INTRAMUSCULAR | Status: AC
  Administered 2023-07-07: 0.5 mL via INTRAMUSCULAR
  Filled 2023-07-07: qty 0.5

## 2023-07-07 NOTE — ED Notes (Signed)
Patient is being discharged from the Urgent Care and sent to the Emergency Department via POV. Per Dorann Ou, PA, patient is in need of higher level of care due to head laceration. Patient is aware and verbalizes understanding of plan of care.  Vitals:   07/07/23 1512 07/07/23 1513  BP:  (!) 121/59  Pulse: 96   Resp: 18   SpO2: 93%

## 2023-07-07 NOTE — ED Provider Notes (Signed)
[place] EMERGENCY DEPARTMENT AT [facility] Provider Note   CSN: [id number] Arrival date & time: 07/07/23  7829     History {Add pertinent medical, surgical, social history, OB history to HPI:1} Chief Complaint  Patient presents with   Head Injury    [name] is a 82 y.o. male.Who presents emergency department chief complaint of head injury.  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  Not to the ground.  He suffered a small laceration.  He is unsure of his last tetanus vaccination.  Denies loss of consciousness nausea or vomiting.  He does not take any blood thinners.  He complains of some soreness in his neck.  Head Injury      Home Medications Prior to Admission medications   Medication Sig Start Date End Date Taking? Authorizing Provider  albuterol (PROVENTIL) (2.5 MG/3ML) 0.083% nebulizer solution Take 3 mLs (2.5 mg total) by nebulization every 4 (four) hours as needed for wheezing or shortness of breath. 04/10/23   Rai, Ripudeep K, MD  albuterol (VENTOLIN HFA) 108 (90 Base) MCG/ACT inhaler Inhale 2 puffs into the lungs every 6 (six) hours as needed for wheezing or shortness of breath. 04/10/23   Rai, Ripudeep K, MD  ASTEPRO 205.5 MCG/SPRAY SOLN Place 2 sprays into both nostrils in the morning.    [provider]  Budeson-Glycopyrrol-Formoterol (BREZTRI AEROSPHERE) 160-9-4.8 MCG/ACT AERO Inhale 2 puffs into the lungs in the morning and at bedtime.    [provider]  buPROPion (WELLBUTRIN XL) 150 MG 24 hr tablet Take 450 mg by mouth every morning.    [provider]  cephALEXin (KEFLEX) 500 MG capsule Take 1 capsule (500 mg total) by mouth 3 (three) times daily. 05/03/23   Wallis Bamberg, PA-C  doxazosin (CARDURA) 8 MG tablet TAKE 1 TABLET BY MOUTH DAILY  . Take at night Patient taking differently: Take 8 mg by mouth at bedtime. 03/11/16   [name], MD   fluticasone [facility] ALLERGY RELIEF) 50 MCG/ACT nasal spray Place 2 sprays into both nostrils every evening.    [provider]  furosemide (LASIX) 40 MG tablet Take 1 tablet (40 mg total) by mouth daily. Take 40 mg by mouth in the morning and an additional 40 mg once a day as needed for unresolved swelling or fluid retention 12/17/22   Runell Gess, MD  gabapentin (NEURONTIN) 300 MG capsule TAKE 1 TO 2 CAPSULES BY MOUTH AT BEDTIME Patient taking differently: Take 600 mg by mouth at bedtime. 01/15/17   Ofilia Neas, PA-C  guaiFENesin (MUCINEX) 600 MG 12 hr tablet Take 2 tablets (1,200 mg total) by mouth 2 (two) times daily. 04/10/23   Rai, Ripudeep K, MD  guaiFENesin-dextromethorphan (ROBITUSSIN DM) 100-10 MG/5ML syrup Take 5 mLs by mouth every 4 (four) hours as needed for cough (chest congestion). 04/10/23   Rai, Delene Ruffini, MD  HYDROcodone-acetaminophen (NORCO) 5-325 MG tablet Take 1-2 tablets by mouth every 4 (four) hours as needed for moderate pain (Use sparingly). Patient taking differently: Take 1 tablet by mouth in the morning and at bedtime. 05/07/22   Marshia Ly, PA-C  methocarbamol (ROBAXIN) 500 MG tablet Take 1 tablet (500 mg total) by mouth every 8 (eight) hours as needed for muscle spasms. Patient taking differently: Take 500 mg by mouth in the morning and at bedtime. 08/08/21   Haskel Schroeder, PA-C  mirtazapine (  REMERON) 15 MG tablet Take 15 mg by mouth at bedtime.    [provider]  montelukast (SINGULAIR) 10 MG tablet Take 1 tablet (10 mg total) by mouth at bedtime. 04/10/23   Rai, Delene Ruffini, MD  Multiple Vitamin (MULTIVITAMIN WITH MINERALS) TABS tablet Take 1 tablet by mouth every morning.    [provider]  pantoprazole (PROTONIX) 40 MG tablet Take 1 tablet (40 mg total) by mouth 2 (two) times daily before a meal. 04/10/23   Rai, Ripudeep K, MD  potassium chloride SA (K-DUR,KLOR-CON) 20 MEQ tablet Take 1 tablet (20 mEq total) by mouth 2 (two)  times daily. 07/23/16   [name], MD  predniSONE (DELTASONE) 10 MG tablet Prednisone dosing: Take  Prednisone 40mg  (4 tabs) x 3 days, then taper to 30mg  (3 tabs) x 3 days, then 20mg  (2 tabs) x 3days, then 10mg  (1 tab) x 3days, then OFF. Dispense:  30 tabs, refills: None 04/10/23   Rai, Ripudeep K, MD  rosuvastatin (CRESTOR) 5 MG tablet Take 5 mg by mouth at bedtime. 12/20/18   [provider]  testosterone cypionate (DEPOTESTOSTERONE CYPIONATE) 200 MG/ML injection Inject 150 mg into the muscle every Saturday.    [provider]      Allergies    Patient has no known allergies.    Review of Systems   Review of Systems  Physical Exam Updated Vital Signs BP 110/78   Pulse 93   Temp 98.6 F (37 C) (Oral)   Resp 18   Ht 5\' 9"  (1.753 m)   Wt 113.4 kg   SpO2 93%   BMI 36.92 kg/m  Physical Exam Vitals and nursing note reviewed.  Constitutional:      General: He is not in acute distress.    Appearance: He is well-developed. He is not diaphoretic.  HENT:     Head: Normocephalic.     Comments: Small abrasion to the back of the head bleeding is controlled    Nose: Nose normal.     Mouth/Throat:     Mouth: Mucous membranes are moist.  Eyes:     General: No scleral icterus.    Conjunctiva/sclera: Conjunctivae normal.     Pupils: Pupils are equal, round, and reactive to light.     Comments: No horizontal, vertical or rotational nystagmus  Neck:     Comments: Full active and passive ROM without pain No midline or paraspinal tenderness No nuchal rigidity or meningeal signs Cardiovascular:     Rate and Rhythm: Normal rate and regular rhythm.  Pulmonary:     Effort: Pulmonary effort is normal. No respiratory distress.     Breath sounds: No wheezing or rales.  Abdominal:     General: There is no distension.     Palpations: Abdomen is soft.     Tenderness: There is no abdominal tenderness. There is no guarding or rebound.  Musculoskeletal:        General: Normal  range of motion.     Cervical back: Normal range of motion and neck supple.  Skin:    General: Skin is warm and dry.     Findings: No rash.  Neurological:     Mental Status: He is alert and oriented to person, place, and time.     Cranial Nerves: No cranial nerve deficit.     Motor: No abnormal muscle tone.     Coordination: Coordination normal.     Comments: Mental Status:  Alert, oriented, thought content appropriate. Speech fluent  without evidence of aphasia. Able to follow 2 step commands without difficulty.  Cranial Nerves:  II:  Peripheral visual fields grossly normal, pupils equal, round, reactive to light III,IV, VI: ptosis not present, extra-ocular motions intact bilaterally  V,VII: smile symmetric, facial light touch sensation equal VIII: hearing grossly normal bilaterally  IX,X: midline uvula rise  XI: bilateral shoulder shrug equal and strong XII: midline tongue extension  Motor:  5/5 in upper and lower extremities bilaterally including strong and equal grip strength and dorsiflexion/plantar flexion Sensory: Pinprick and light touch normal in all extremities.  Cerebellar: normal finger-to-nose with bilateral upper extremities CV: distal pulses palpable throughout   Psychiatric:        Behavior: Behavior normal.        Thought Content: Thought content normal.        Judgment: Judgment normal.     ED Results / Procedures / Treatments   Labs (all labs ordered are listed, but only abnormal results are displayed) Labs Reviewed - No data to display  EKG None  Radiology No results found.  Procedures Procedures  {Document cardiac monitor, telemetry assessment procedure when appropriate:1}  Medications Ordered in ED Medications - No data to display  ED Course/ Medical Decision Making/ A&P   {   Click here for ABCD2, HEART and other calculatorsREFRESH Note before signing :1}                              Medical Decision Making  ***  {Document critical care  time when appropriate:1} {Document review of labs and clinical decision tools ie heart score, Chads2Vasc2 etc:1}  {Document your independent review of radiology images, and any outside records:1} {Document your discussion with family members, caretakers, and with consultants:1} {Document social determinants of health affecting pt's care:1} {Document your decision making why or why not admission, treatments were needed:1} Final Clinical Impression(s) / ED Diagnoses Final diagnoses:  None    Rx / DC Orders ED Discharge Orders     None

## 2023-07-07 NOTE — ED Triage Notes (Signed)
Pt presents with a head laceration. Pt states he was moving things around at home in his basement and states something fell on his head and knocked him out.  C/o feeling off balance and bleeding.States he bit his tongue. Pt states he not on blood thinner.

## 2023-07-07 NOTE — Discharge Instructions (Addendum)
Get help right away if: You have sudden: Severe headache. Severe vomiting. Unequal pupil size. One is bigger than the other. Vision problems. Confusion or irritability. You have a seizure. Your symptoms get worse. You have clear or bloody fluid coming from your nose or ears. These symptoms may be an emergency. Get help right away. Call 911. Do not wait to see if the symptoms will go away. Do not drive yourself to the hospital.

## 2023-07-07 NOTE — ED Provider Notes (Signed)
I was called into triage by nursing staff to evaluate patient.  Reports that within the past hour he was working in his basement when something fell on his head.  He was alone and is unsure whether or not he lost consciousness.  He had some nausea but has not had any vomiting.  He did bite his tongue and has noticed that he has a wound on the top of his head.  He does not take any blood thinning medications.  We discussed that given his age and history I do recommend that he go to the emergency room since we do not have head CT capabilities in urgent care.  He declined EMS transport and will go directly to ER for further evaluation and management.  Small bandage was placed over skin tear on crown prior to discharge.   Jeani Hawking, PA-C 07/07/23 1525

## 2023-07-07 NOTE — ED Triage Notes (Addendum)
Pt to ED from UC c/o head injury. Reports a metal stool fell from a shelf and hit him in the head when he was at his home. Not on blood thinners. No LOC. A&O in triage. Laceration to head noted. Bleeding controlled.

## 2023-07-08 ENCOUNTER — Other Ambulatory Visit: Payer: Self-pay | Admitting: Urology

## 2023-07-08 DIAGNOSIS — M25551 Pain in right hip: Secondary | ICD-10-CM | POA: Diagnosis not present

## 2023-07-08 DIAGNOSIS — S0101XD Laceration without foreign body of scalp, subsequent encounter: Secondary | ICD-10-CM | POA: Diagnosis not present

## 2023-07-08 DIAGNOSIS — R29898 Other symptoms and signs involving the musculoskeletal system: Secondary | ICD-10-CM | POA: Diagnosis not present

## 2023-07-08 DIAGNOSIS — Z6839 Body mass index (BMI) 39.0-39.9, adult: Secondary | ICD-10-CM | POA: Diagnosis not present

## 2023-07-08 DIAGNOSIS — N400 Enlarged prostate without lower urinary tract symptoms: Secondary | ICD-10-CM

## 2023-07-14 DIAGNOSIS — M1611 Unilateral primary osteoarthritis, right hip: Secondary | ICD-10-CM | POA: Diagnosis not present

## 2023-07-14 DIAGNOSIS — M1711 Unilateral primary osteoarthritis, right knee: Secondary | ICD-10-CM | POA: Diagnosis not present

## 2023-07-27 ENCOUNTER — Telehealth: Payer: Self-pay | Admitting: *Deleted

## 2023-07-27 NOTE — Telephone Encounter (Signed)
Pre-operative Risk Assessment    Patient Name: [name]  DOB: October 17, 1941 MRN: [id number]      Request for Surgical Clearance    Procedure:   Right Total Hip Arthroplasty  Date of Surgery:  Clearance TBD                                 Surgeon:  Dr. Milly Jakob Surgeon's Group or Practice Name:  [facility] Phone number:  [phone] Fax number:  [phone]   Type of Clearance Requested:   - Medical    Type of Anesthesia:  Not Indicated   Additional requests/questions:    Signed, Emmit Pomfret   07/27/2023, 8:11 AM

## 2023-07-27 NOTE — Telephone Encounter (Signed)
1st attempt at scheduling in-office appt. lvmtrc

## 2023-07-27 NOTE — Telephone Encounter (Signed)
Name: [name]  DOB: April 14, 1941  MRN: [id number]  Primary Cardiologist: Nanetta Batty, MD  Chart reviewed as part of pre-operative protocol coverage. Because of [name]'s past medical history and time since last visit, he will require a follow-up in-office visit in order to better assess preoperative cardiovascular risk.  Pre-op covering staff: - Please schedule appointment and call patient to inform them. If patient already had an upcoming appointment within acceptable timeframe, please add "pre-op clearance" to the appointment notes so provider is aware. - Please contact requesting surgeon's office via preferred method (i.e, phone, fax) to inform them of need for appointment prior to surgery.   Rip Harbour, NP  07/27/2023, 11:09 AM

## 2023-07-28 NOTE — Telephone Encounter (Signed)
2nd attempt at scheduling in-office visit. lvmtrc

## 2023-08-04 NOTE — Telephone Encounter (Signed)
3rd and final attempt to reach pt regarding surgical clearance and the need for an IN OFFICE appointment.  Left another message for pt to call back.  Will route to the requesting surgeon's office to see if they can have pt call back.

## 2023-08-06 ENCOUNTER — Telehealth: Payer: Self-pay | Admitting: Pulmonary Disease

## 2023-08-06 NOTE — Telephone Encounter (Signed)
Fax received from Dr. Milly Jakob with [facility] to perform a right total right hip arthroplasty on patient.  Patient needs surgery clearance. Surgery is TBD. Patient was seen on 01/02/23. Office protocol is a risk assessment can be sent to surgeon if patient has been seen in 60 days or less.    Pt needs appt for risk assessment  I called him and there was no answer- LMTCB to schedule appt.

## 2023-08-07 DIAGNOSIS — I890 Lymphedema, not elsewhere classified: Secondary | ICD-10-CM | POA: Diagnosis not present

## 2023-08-11 ENCOUNTER — Telehealth: Payer: Self-pay | Admitting: Physical Medicine and Rehabilitation

## 2023-08-11 NOTE — Telephone Encounter (Signed)
Please call pt when you get back. Pt would like to set an appt for back injection ASAP. Pt phone number is [phone].

## 2023-08-12 ENCOUNTER — Telehealth: Payer: Self-pay | Admitting: Physical Medicine and Rehabilitation

## 2023-08-12 DIAGNOSIS — M5416 Radiculopathy, lumbar region: Secondary | ICD-10-CM

## 2023-08-12 NOTE — Telephone Encounter (Signed)
Patient called returning your call. CB#[phone]

## 2023-08-12 NOTE — Telephone Encounter (Signed)
LVM to return call to provide more information

## 2023-08-13 NOTE — Telephone Encounter (Signed)
Spoke with patient and he is having the same pain as before. He stated the last injection worked until about 6 weeks ago and he received more than 75% relief. No new falls, accidents or injuries. Please advise.

## 2023-08-13 NOTE — Addendum Note (Signed)
Addended by: Sharlet Salina on: 08/13/2023 04:22 PM   Modules accepted: Orders

## 2023-08-21 NOTE — Telephone Encounter (Signed)
I called and spoke with the pt and advised needs appt fr surg clearance  He says that they are aiming to do his procedure before Thanksgiving  No appt available here until 10/14/23  Dr Isaiah Serge- can we overbook you to see him to get his risk assessment done?

## 2023-08-24 ENCOUNTER — Telehealth: Payer: Self-pay | Admitting: Physical Medicine and Rehabilitation

## 2023-08-24 ENCOUNTER — Encounter: Payer: Medicare Other | Admitting: Physical Medicine and Rehabilitation

## 2023-08-24 DIAGNOSIS — R7303 Prediabetes: Secondary | ICD-10-CM | POA: Diagnosis not present

## 2023-08-24 DIAGNOSIS — R079 Chest pain, unspecified: Secondary | ICD-10-CM | POA: Diagnosis not present

## 2023-08-24 DIAGNOSIS — I10 Essential (primary) hypertension: Secondary | ICD-10-CM | POA: Diagnosis not present

## 2023-08-24 DIAGNOSIS — I872 Venous insufficiency (chronic) (peripheral): Secondary | ICD-10-CM | POA: Diagnosis not present

## 2023-08-24 DIAGNOSIS — N1832 Chronic kidney disease, stage 3b: Secondary | ICD-10-CM | POA: Diagnosis not present

## 2023-08-24 DIAGNOSIS — E782 Mixed hyperlipidemia: Secondary | ICD-10-CM | POA: Diagnosis not present

## 2023-08-24 DIAGNOSIS — Z6838 Body mass index (BMI) 38.0-38.9, adult: Secondary | ICD-10-CM | POA: Diagnosis not present

## 2023-08-24 DIAGNOSIS — R0602 Shortness of breath: Secondary | ICD-10-CM | POA: Diagnosis not present

## 2023-08-24 NOTE — Telephone Encounter (Signed)
Patient called and said that he is sick and can't come to the appointment today. CB#[phone]

## 2023-08-24 NOTE — Telephone Encounter (Signed)
F/u

## 2023-08-24 NOTE — Telephone Encounter (Signed)
Pt returning missed call to get scheduled

## 2023-08-25 ENCOUNTER — Ambulatory Visit (INDEPENDENT_AMBULATORY_CARE_PROVIDER_SITE_OTHER): Payer: Medicare Other

## 2023-08-25 ENCOUNTER — Ambulatory Visit: Payer: Medicare Other | Attending: Physician Assistant | Admitting: Physician Assistant

## 2023-08-25 ENCOUNTER — Encounter: Payer: Self-pay | Admitting: Physician Assistant

## 2023-08-25 VITALS — BP 134/80 | HR 67 | Ht 69.0 in | Wt 255.4 lb

## 2023-08-25 DIAGNOSIS — R079 Chest pain, unspecified: Secondary | ICD-10-CM

## 2023-08-25 DIAGNOSIS — R0789 Other chest pain: Secondary | ICD-10-CM | POA: Diagnosis not present

## 2023-08-25 DIAGNOSIS — G4733 Obstructive sleep apnea (adult) (pediatric): Secondary | ICD-10-CM

## 2023-08-25 DIAGNOSIS — N183 Chronic kidney disease, stage 3 unspecified: Secondary | ICD-10-CM

## 2023-08-25 DIAGNOSIS — I10 Essential (primary) hypertension: Secondary | ICD-10-CM | POA: Diagnosis not present

## 2023-08-25 DIAGNOSIS — R600 Localized edema: Secondary | ICD-10-CM

## 2023-08-25 DIAGNOSIS — R0609 Other forms of dyspnea: Secondary | ICD-10-CM | POA: Diagnosis not present

## 2023-08-25 DIAGNOSIS — E7849 Other hyperlipidemia: Secondary | ICD-10-CM

## 2023-08-25 DIAGNOSIS — R42 Dizziness and giddiness: Secondary | ICD-10-CM

## 2023-08-25 LAB — LAB REPORT - SCANNED: EGFR: 22

## 2023-08-25 NOTE — Telephone Encounter (Signed)
LVM to return call to reschedule.

## 2023-08-25 NOTE — Progress Notes (Addendum)
Cardiology Office Note:  .   Date:  08/25/2023  ID:  [name], DOB Mar 22, 1941, MRN [id number] PCP: Gweneth Dimitri, MD  [facility] Providers Cardiologist:  Nanetta Batty, MD    History of Present Illness: .   [name] is a 82 y.o. male with history of chest pain, normal cath 2008, HLD, chronic LEE, OSA on cpap, HTN, HLD.Myoview in April 2018 demonstrated EF 45%, mild diffuse hypokinesis, no ischemia was noted, overall low risk study. 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 Repeat echocardiogram in September 2020 showed normal EF, grade 1 DD. 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  Seen in the hospital 03/2023 for volume overload, BNP was low. Was having some dizziness and zio ordered but never worn. Consider outpatien lexi. Felt to have hypoxia with coughing spells that could contribute to his dizziness.  Patient went to [facility] and had chest pain and dizziness. Troponins up 51and Crt 2.83-was 2.06 03/2023.-refused to go to ED and appt made here. BP was up yest but ok today.   A week ago he wasn't feeling good and room started spinning around him.Lasted a long time. He's weak and totally exhausted just walking in here-panting. Denies chest pain but does have a lot of indigestion that causes pressure & belching-worse after going to [employer] yest eating sausage, egss and grits, belches with drinking water. Some pain in his left arm. Hurt in his chest to take a deep breath. Hasn't had for a few days. Denies chest pain now.  Chronic LEE and legs wrapped regularly. Feet get cold. Says he drinks plenty of fluid.      ROS:    Studies  Reviewed: [name]    EKG Interpretation Date/Time:  Tuesday August 25 2023 12:26:15 EDT Ventricular Rate:  70 PR Interval:  238 QRS Duration:  92 QT Interval:  444 QTC Calculation: 479 R Axis:   6  Text Interpretation: Sinus rhythm with 1st degree A-V block Nonspecific T wave abnormality Prolonged QT When compared with ECG of 07-Apr-2023 18:28, PREVIOUS ECG IS PRESENT Confirmed by [name] [phone]) on 08/25/2023 12:36:28 PM    Prior CV Studies:   Echo 01/2023 IMPRESSIONS     1. Left ventricular ejection fraction, by estimation, is 60 to 65%. Left  ventricular ejection fraction by 3D volume is 60 %. The left ventricle has  normal function. The left ventricle has no regional wall motion  abnormalities. There is mild left  ventricular hypertrophy. Left ventricular diastolic parameters are  indeterminate.   2. Right ventricular systolic function is normal. The right ventricular  size is normal. There is normal pulmonary artery systolic pressure. The  estimated right ventricular systolic pressure is 30.1 mmHg.   3. The mitral valve is normal in structure. Trivial mitral valve  regurgitation.   4. The aortic valve is tricuspid. Aortic valve regurgitation is mild. No  aortic stenosis is present.   5. Aortic dilatation noted. There is mild dilatation of the ascending  aorta, measuring 38 mm.   6. The inferior vena cava is dilated in size with >50% respiratory  variability, suggesting right atrial pressure of 8 mmHg.   FINDINGS   Left  Ventricle: Left ventricular ejection fraction, by estimation, is 60  to 65%. Left ventricular ejection fraction by 3D volume is 60 %. The left  ventricle has normal function. The left ventricle has no regional wall  motion abnormalities. The left  ventricular internal cavity size was normal in size. There is mild left  ventricular hypertrophy. Left ventricular diastolic parameters are  indeterminate.   Right Ventricle: The right ventricular size is  normal. No increase in  right ventricular wall thickness. Right ventricular systolic function is  normal. There is normal pulmonary artery systolic pressure. The tricuspid  regurgitant velocity is 2.35 m/s, and   with an assumed right atrial pressure of 8 mmHg, the estimated right  ventricular systolic pressure is 30.1 mmHg.   Left Atrium: Left atrial size was normal in size.   Right Atrium: Right atrial size was normal in size.   Pericardium: There is no evidence of pericardial effusion.   Mitral Valve: The mitral valve is normal in structure. Trivial mitral  valve regurgitation.   Tricuspid Valve: The tricuspid valve is normal in structure. Tricuspid  valve regurgitation is trivial.   Aortic Valve: The aortic valve is tricuspid. Aortic valve regurgitation is  mild. Aortic regurgitation PHT measures 591 msec. No aortic stenosis is  present.   Pulmonic Valve: The pulmonic valve was not well visualized. Pulmonic valve  regurgitation is trivial.   Aorta: The aortic root is normal in size and structure and aortic  dilatation noted. There is mild dilatation of the ascending aorta,  measuring 38 mm.   Venous: The inferior vena cava is dilated in size with greater than 50%  respiratory variability, suggesting right atrial pressure of 8 mmHg.   IAS/Shunts: The interatrial septum was not well visualized.      Risk Assessment/Calculations:             Physical Exam:   VS:  BP 134/80   Pulse 67   Ht 5\' 9"  (1.753 m)   Wt 255 lb 6.4 oz (115.8 kg)   SpO2 97%   BMI 37.72 kg/m    Wt Readings from Last 3 Encounters:  08/25/23 255 lb 6.4 oz (115.8 kg)  07/07/23 250 lb (113.4 kg)  04/09/23 275 lb 1.6 oz (124.8 kg)    ZOX:WRUEA in no acute distress NECK: No JVD; No carotid bruits CARDIAC:  RRR, no murmurs, rubs, gallops RESPIRATORY:  decreased breath sounds but Clear to auscultation without rales, wheezing or rhonchi  ABDOMEN: Soft, non-tender, non-distended EXTREMITIES:  legs  wrapped, No edema; No deformity   ASSESSMENT AND PLAN: .    Chest pain nonspecific off/on. Worse with eating and belching a lot. Also DOE. Troponin up 51 yest at [facility] but Crt 2.83.EKG NSR with nonspecific ST changes unchanged from prior tracing. Refused to go to ED. Not currently having chest pain. Discussed with Dr. Anne Fu who concurs that troponin could be elevated due to CKD. Will do further work up with [employer] with his myriad of symptoms.   Chronic DOE-lexiscan and f/u with pulmonary  HTN-controlled today.  Dizziness since he hit his head on a concrete pole.He was evaluated for a concussion. Sounds more like vertigo with room spinning. Will place zio monitor to rule out arrhythmia.   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  OSA compliant on CPAP     Informed Consent  Shared Decision Making/Informed Consent The risks [chest pain, shortness of breath, cardiac arrhythmias, dizziness, blood pressure fluctuations, myocardial infarction, stroke/transient ischemic attack, nausea, vomiting, allergic reaction, radiation exposure, metallic taste sensation and life-threatening complications (estimated to be 1 in 10,000)], benefits (risk stratification, diagnosing coronary artery disease, treatment guidance) and alternatives of a nuclear stress test were discussed in detail with Mr. Moscatello and he agrees to proceed.     Dispo: f/u with Dr. Allyson Sabal  Signed, [name], PA-C

## 2023-08-25 NOTE — Patient Instructions (Addendum)
Medication Instructions:  Your physician recommends that you continue on your current medications as directed. Please refer to the Current Medication list given to you today.  *If you need a refill on your cardiac medications before your next appointment, please call your pharmacy*   Lab Work: None ordered   If you have labs (blood work) drawn today and your tests are completely normal, you will receive your results only by: MyChart Message (if you have MyChart) OR A paper copy in the mail If you have any lab test that is abnormal or we need to change your treatment, we will call you to review the results.   Testing/Procedures: Your physician has requested that you have a lexiscan myoview ASAP. For further information please visit [email or web address] Please follow instruction sheet, as given.  A zio monitor was ordered today. It will remain on for 14 days. You will then return monitor and event diary in provided box. It takes 1-2 weeks for report to be downloaded and returned to [place]. We will call you with the results. If monitor falls off or has orange flashing light, please call Zio for further instructions.   Your physician has referred you to [facility]   Follow-Up: At [facility], you and your health needs are our priority.  As part of our continuing mission to provide you with exceptional heart care, we have created designated Provider Care Teams.  These Care Teams include your primary Cardiologist (physician) and Advanced Practice Providers (APPs -  Physician Assistants and Nurse Practitioners) who all work together to provide you with the care you need, when you need it.  We recommend signing up for the patient portal called "MyChart".  Sign up information is provided on this After Visit Summary.  MyChart is used to connect with patients for Virtual Visits (Telemedicine).  Patients are able to view lab/test results, encounter notes, upcoming appointments,  etc.  Non-urgent messages can be sent to your provider as well.   To learn more about what you can do with MyChart, go to [email or web address].    Your next appointment:   1 month(s)  Provider:   Nanetta Batty, MD     Other Instructions   You are scheduled for a Myocardial Perfusion Imaging Study on:  __________ at _______.  Please arrive 15 minutes prior to your appointment time for registration and insurance purposes.  The test will take approximately 3 to 4 hours to complete; you may bring reading material.  If someone comes with you to your appointment, they will need to remain in the main lobby due to limited space in the testing area. **If you are pregnant or breastfeeding, please notify the nuclear lab prior to your appointment**  How to prepare for your Myocardial Perfusion Test: Do not eat or drink 3 hours prior to your test, except you may have water. Do not consume products containing caffeine (regular or decaffeinated) 12 hours prior to your test. (ex: coffee, chocolate, sodas, tea). Hold Lasix the morning of your test  Do wear comfortable clothes (no dresses or overalls) and walking shoes, tennis shoes preferred (No heels or open toe shoes are allowed). Do NOT wear cologne, perfume, aftershave, or lotions (deodorant is allowed). If these instructions are not followed, your test will have to be rescheduled.    ZIO XT- Long Term Monitor Instructions  Your physician has requested you wear a ZIO patch monitor for 14 days.  This is a single patch monitor. [employer] supplies one patch monitor  per enrollment. Additional stickers are not available. Please do not apply patch if you will be having a Nuclear Stress Test,  Echocardiogram, Cardiac CT, MRI, or Chest Xray during the period you would be wearing the  monitor. The patch cannot be worn during these tests. You cannot remove and re-apply the  ZIO XT patch monitor.  Your ZIO patch monitor will be mailed 3 day USPS to your  address on file. It may take 3-5 days  to receive your monitor after you have been enrolled.  Once you have received your monitor, please review the enclosed instructions. Your monitor  has already been registered assigning a specific monitor serial # to you.  Billing and Patient Assistance Program Information  We have supplied [employer] with any of your insurance information on file for billing purposes. [employer] offers a sliding scale Patient Assistance Program for patients that do not have  insurance, or whose insurance does not completely cover the cost of the ZIO monitor.  You must apply for the Patient Assistance Program to qualify for this discounted rate.  To apply, please call [employer] at [phone], select option 4, select option 2, ask to apply for  Patient Assistance Program. Meredeth Ide will ask your household income, and how many people  are in your household. They will quote your out-of-pocket cost based on that information.  [employer] will also be able to set up a 94-month, interest-free payment plan if needed.  Applying the monitor   Shave hair from upper left chest.  Hold abrader disc by orange tab. Rub abrader in 40 strokes over the upper left chest as  indicated in your monitor instructions.  Clean area with 4 enclosed alcohol pads. Let dry.  Apply patch as indicated in monitor instructions. Patch will be placed under collarbone on left  side of chest with arrow pointing upward.  Rub patch adhesive wings for 2 minutes. Remove white label marked "1". Remove the white  label marked "2". Rub patch adhesive wings for 2 additional minutes.  While looking in a mirror, press and release button in center of patch. A small green light will  flash 3-4 times. This will be your only indicator that the monitor has been turned on.  Do not shower for the first 24 hours. You may shower after the first 24 hours.  Press the button if you feel a symptom. You will hear a small click. Record  Date, Time and  Symptom in the Patient Logbook.  When you are ready to remove the patch, follow instructions on the last 2 pages of Patient  Logbook. Stick patch monitor onto the last page of Patient Logbook.  Place Patient Logbook in the blue and white box. Use locking tab on box and tape box closed  securely. The blue and white box has prepaid postage on it. Please place it in the mailbox as  soon as possible. Your physician should have your test results approximately 7 days after the  monitor has been mailed back to [facility].  Call [facility] Customer Care at [phone] if you have questions regarding  your ZIO XT patch monitor. Call them immediately if you see an orange light blinking on your  monitor.  If your monitor falls off in less than 4 days, contact our Monitor department at [phone].  If your monitor becomes loose or falls off after 4 days call [employer] at [phone] for  suggestions on securing your monitor

## 2023-08-25 NOTE — Telephone Encounter (Signed)
APPS are full  Need to overbook [place]  Called the pt and there was no answer- LMTCB.

## 2023-08-25 NOTE — Telephone Encounter (Signed)
Are there no appointments with APPs too?  In that case it is okay to take to [facility]

## 2023-08-25 NOTE — Progress Notes (Unsigned)
ZIO XT serial # [id number] from office inventory applied in office using tincture of benzoin.  Dr. Allyson Sabal to read.

## 2023-08-26 ENCOUNTER — Encounter (HOSPITAL_COMMUNITY): Payer: Self-pay

## 2023-08-26 ENCOUNTER — Emergency Department (HOSPITAL_COMMUNITY): Payer: Medicare Other

## 2023-08-26 ENCOUNTER — Other Ambulatory Visit: Payer: Self-pay

## 2023-08-26 ENCOUNTER — Emergency Department (HOSPITAL_COMMUNITY)
Admission: EM | Admit: 2023-08-26 | Discharge: 2023-08-27 | Disposition: A | Discharge status: Home / Self Care | Payer: Medicare Other | Attending: Emergency Medicine | Admitting: Emergency Medicine

## 2023-08-26 DIAGNOSIS — I10 Essential (primary) hypertension: Secondary | ICD-10-CM | POA: Diagnosis not present

## 2023-08-26 DIAGNOSIS — R42 Dizziness and giddiness: Secondary | ICD-10-CM | POA: Diagnosis not present

## 2023-08-26 DIAGNOSIS — N289 Disorder of kidney and ureter, unspecified: Secondary | ICD-10-CM | POA: Diagnosis not present

## 2023-08-26 DIAGNOSIS — S0232XA Fracture of orbital floor, left side, initial encounter for closed fracture: Secondary | ICD-10-CM | POA: Diagnosis not present

## 2023-08-26 DIAGNOSIS — J9811 Atelectasis: Secondary | ICD-10-CM | POA: Diagnosis not present

## 2023-08-26 DIAGNOSIS — R600 Localized edema: Secondary | ICD-10-CM

## 2023-08-26 DIAGNOSIS — R29818 Other symptoms and signs involving the nervous system: Secondary | ICD-10-CM | POA: Diagnosis not present

## 2023-08-26 DIAGNOSIS — R7989 Other specified abnormal findings of blood chemistry: Secondary | ICD-10-CM | POA: Diagnosis not present

## 2023-08-26 DIAGNOSIS — I517 Cardiomegaly: Secondary | ICD-10-CM | POA: Diagnosis not present

## 2023-08-26 DIAGNOSIS — R7309 Other abnormal glucose: Secondary | ICD-10-CM | POA: Diagnosis not present

## 2023-08-26 DIAGNOSIS — N189 Chronic kidney disease, unspecified: Secondary | ICD-10-CM | POA: Insufficient documentation

## 2023-08-26 DIAGNOSIS — D649 Anemia, unspecified: Secondary | ICD-10-CM | POA: Insufficient documentation

## 2023-08-26 DIAGNOSIS — I129 Hypertensive chronic kidney disease with stage 1 through stage 4 chronic kidney disease, or unspecified chronic kidney disease: Secondary | ICD-10-CM | POA: Insufficient documentation

## 2023-08-26 DIAGNOSIS — E876 Hypokalemia: Secondary | ICD-10-CM | POA: Diagnosis not present

## 2023-08-26 DIAGNOSIS — R739 Hyperglycemia, unspecified: Secondary | ICD-10-CM

## 2023-08-26 DIAGNOSIS — I77819 Aortic ectasia, unspecified site: Secondary | ICD-10-CM | POA: Diagnosis not present

## 2023-08-26 DIAGNOSIS — H81399 Other peripheral vertigo, unspecified ear: Secondary | ICD-10-CM | POA: Diagnosis not present

## 2023-08-26 DIAGNOSIS — R110 Nausea: Secondary | ICD-10-CM | POA: Diagnosis not present

## 2023-08-26 DIAGNOSIS — G4489 Other headache syndrome: Secondary | ICD-10-CM | POA: Diagnosis not present

## 2023-08-26 DIAGNOSIS — R9089 Other abnormal findings on diagnostic imaging of central nervous system: Secondary | ICD-10-CM | POA: Diagnosis not present

## 2023-08-26 LAB — CBC
HCT: 33.6 % — ABNORMAL LOW (ref 39.0–52.0)
Hemoglobin: 11.8 g/dL — ABNORMAL LOW (ref 13.0–17.0)
MCH: 33.9 pg (ref 26.0–34.0)
MCHC: 35.1 g/dL (ref 30.0–36.0)
MCV: 96.6 fL (ref 80.0–100.0)
Platelets: 173 10*3/uL (ref 150–400)
RBC: 3.48 MIL/uL — ABNORMAL LOW (ref 4.22–5.81)
RDW: 12.3 % (ref 11.5–15.5)
WBC: 5.9 10*3/uL (ref 4.0–10.5)
nRBC: 0 % (ref 0.0–0.2)

## 2023-08-26 LAB — BASIC METABOLIC PANEL
Anion gap: 10 (ref 5–15)
BUN: 28 mg/dL — ABNORMAL HIGH (ref 8–23)
CO2: 23 mmol/L (ref 22–32)
Calcium: 8 mg/dL — ABNORMAL LOW (ref 8.9–10.3)
Chloride: 106 mmol/L (ref 98–111)
Creatinine, Ser: 2.32 mg/dL — ABNORMAL HIGH (ref 0.61–1.24)
GFR, Estimated: 27 mL/min — ABNORMAL LOW (ref >60)
Glucose, Bld: 102 mg/dL — ABNORMAL HIGH (ref 70–99)
Potassium: 2.8 mmol/L — ABNORMAL LOW (ref 3.5–5.1)
Sodium: 139 mmol/L (ref 135–145)

## 2023-08-26 LAB — TROPONIN I (HIGH SENSITIVITY)
Troponin I (High Sensitivity): 11 ng/L (ref <18)
Troponin I (High Sensitivity): 11 ng/L (ref <18)

## 2023-08-26 NOTE — ED Triage Notes (Signed)
Pt BIB [facility] from home d/t dizziness for the past week with a "posterior" HA that feels like his eyes will "cave in." BP was 238/100 for EMS. Pt reports dizziness led him to see his PCP last week & they determined he had a cardiac event & advised him to go to ED for eval, he never came in. He was told his Trops were elevated on Monday (08/24/23) & still did not come in. He saw a cardiologist yesterday who advised him the same as his PCP but he did not want to come into ED. He decided to day after his dizziness would not go away & his vision felt "hazy" (while not wearing his glasses) that he would come in for eval. A/Ox4, 78 bpm, 94% on RA, denies any CP at this time & has a 18g Lt AC PIV.

## 2023-08-26 NOTE — ED Notes (Signed)
Urine sample collected in sort and sent lab

## 2023-08-26 NOTE — ED Provider Triage Note (Signed)
Emergency Medicine Provider Triage Evaluation Note  [name] , a 82 y.o. male  was evaluated in triage.  Pt complains of dizziness.  Patient reportedly was seen by primary care provider few days ago after having several days of dizziness that began 5 days ago.  He was that his right eye feels "caved in" patient blood pressure was elevated with EMS prior to arriving.  Was seen by primary care provider few days ago with labs drawn and they informed him that he had an elevated troponin level.  He is advised to come to the emergency department a few days ago at which point he did not come in for evaluation.  Reportedly was seen by cardiology at Dr. Benay Spice office 1 or 2 days ago and they also confirmed that he likely did have some sort of cardiac event.  Feels that his dizziness is not fully resolving and his vision still appears to be somewhat hazy although he does not currently have his glasses on.  Denies any nausea, vomiting, diaphoresis.  No shortness of breath.  Review of Systems  Positive: As above Negative: As above  Physical Exam  BP (!) 147/69   Pulse 76   Temp 98.1 F (36.7 C)   Resp 18   Ht 5\' 9"  (1.753 m)   Wt 111.1 kg   SpO2 93%   BMI 36.18 kg/m  Gen:   Awake, no distress   Resp:  Normal effort  MSK:   Moves extremities without difficulty  Other:  PERRL, some dizziness with EOM testing  Medical Decision Making  Medically screening exam initiated at 7:18 PM.  Appropriate orders placed.  [name] was informed that the remainder of the evaluation will be completed by another provider, this initial triage assessment does not replace that evaluation, and the importance of remaining in the ED until their evaluation is complete.     Smitty Knudsen, PA-C 08/26/23 1919

## 2023-08-27 ENCOUNTER — Emergency Department (HOSPITAL_COMMUNITY): Payer: Medicare Other

## 2023-08-27 DIAGNOSIS — R9089 Other abnormal findings on diagnostic imaging of central nervous system: Secondary | ICD-10-CM | POA: Diagnosis not present

## 2023-08-27 DIAGNOSIS — S0232XA Fracture of orbital floor, left side, initial encounter for closed fracture: Secondary | ICD-10-CM | POA: Diagnosis not present

## 2023-08-27 DIAGNOSIS — H81399 Other peripheral vertigo, unspecified ear: Secondary | ICD-10-CM | POA: Diagnosis not present

## 2023-08-27 DIAGNOSIS — R29818 Other symptoms and signs involving the nervous system: Secondary | ICD-10-CM | POA: Diagnosis not present

## 2023-08-27 LAB — MAGNESIUM: Magnesium: 2 mg/dL (ref 1.7–2.4)

## 2023-08-27 MED ORDER — POTASSIUM CHLORIDE CRYS ER 20 MEQ PO TBCR
40.0000 meq | EXTENDED_RELEASE_TABLET | Freq: Once | ORAL | Status: AC
  Administered 2023-08-27: 40 meq via ORAL
  Filled 2023-08-27: qty 2

## 2023-08-27 MED ORDER — POTASSIUM CHLORIDE CRYS ER 20 MEQ PO TBCR
40.0000 meq | EXTENDED_RELEASE_TABLET | Freq: Once | ORAL | Status: AC
  Administered 2023-08-27: 40 meq via ORAL
  Filled 2023-08-27 (×2): qty 2

## 2023-08-27 MED ORDER — POTASSIUM CHLORIDE 10 MEQ/100ML IV SOLN
10.0000 meq | INTRAVENOUS | Status: AC
  Administered 2023-08-27 (×3): 10 meq via INTRAVENOUS
  Filled 2023-08-27 (×3): qty 100

## 2023-08-27 MED ORDER — MECLIZINE HCL 25 MG PO TABS
25.0000 mg | ORAL_TABLET | Freq: Once | ORAL | Status: AC
  Administered 2023-08-27: 25 mg via ORAL
  Filled 2023-08-27: qty 1

## 2023-08-27 MED ORDER — MECLIZINE HCL 25 MG PO TABS: 25.0000 mg | ORAL_TABLET | Freq: Three times a day (TID) | ORAL | 0 refills | Status: DC | PRN

## 2023-08-27 MED ORDER — POTASSIUM CHLORIDE CRYS ER 20 MEQ PO TBCR
40.0000 meq | EXTENDED_RELEASE_TABLET | Freq: Two times a day (BID) | ORAL | 3 refills | Status: AC
Start: 2023-08-27 — End: ?

## 2023-08-27 NOTE — ED Provider Notes (Signed)
[place] EMERGENCY DEPARTMENT AT [facility] Provider Note   CSN: [id number] Arrival date & time: 08/26/23  1848     History  Chief Complaint  Patient presents with   Dizziness   PCP labs-Elevated Troponin   Headache    [name] is a 82 y.o. male.  The history is provided by the patient.  Dizziness Associated symptoms: headaches   Headache Associated symptoms: dizziness   He has history of hypertension, prediabetes, hyperlipidemia, chronic kidney disease and comes in because of dizziness and chest pain and shortness of breath.  He started having dizziness about 6 days ago.  He describes a sense of things spinning with associated nausea and difficulty breathing.  He has also noted some intermittent chest pain.  He had fallen 2 months ago and hit the back of his head and he continues to have pain in the back of his head.  He denies loss of balance.  He denies vomiting or diaphoresis.  He had seen a physician at [facility] 2 days ago and troponin was checked and was elevated and he was advised to come into the hospital, but he declined to come in.  He did see his cardiologist who also recommended he come to the hospital but he declined that as well.  However, because symptoms were persisting, he finally decided to come in.  He notes that his dizziness is worse when he stands up.  It is also worse with head movement.  He is a non-smoker.   Home Medications Prior to Admission medications   Medication Sig Start Date End Date Taking? Authorizing Provider  albuterol (VENTOLIN HFA) 108 (90 Base) MCG/ACT inhaler Inhale 2 puffs into the lungs every 6 (six) hours as needed for wheezing or shortness of breath. 04/10/23   Rai, Ripudeep K, MD  ASTEPRO 205.5 MCG/SPRAY SOLN Place 2 sprays into both nostrils in the morning.    [provider]  Budeson-Glycopyrrol-Formoterol (BREZTRI AEROSPHERE) 160-9-4.8 MCG/ACT AERO Inhale 2 puffs into the lungs in the morning and at bedtime.     [provider]  buPROPion (WELLBUTRIN XL) 150 MG 24 hr tablet Take 450 mg by mouth every morning.    [provider]  doxazosin (CARDURA) 8 MG tablet TAKE 1 TABLET BY MOUTH DAILY  . Take at night Patient taking differently: Take 8 mg by mouth at bedtime. 03/11/16   [name], MD  fluticasone [facility] ALLERGY RELIEF) 50 MCG/ACT nasal spray Place 2 sprays into both nostrils every evening.    [provider]  furosemide (LASIX) 40 MG tablet Take 1 tablet (40 mg total) by mouth daily. Take 40 mg by mouth in the morning and an additional 40 mg once a day as needed for unresolved swelling or fluid retention 12/17/22   Runell Gess, MD  gabapentin (NEURONTIN) 300 MG capsule TAKE 1 TO 2 CAPSULES BY MOUTH AT BEDTIME Patient taking differently: Take 600 mg by mouth at bedtime. 01/15/17   Ofilia Neas, PA-C  guaiFENesin (MUCINEX) 600 MG 12 hr tablet Take 2 tablets (1,200 mg total) by mouth 2 (two) times daily. 04/10/23   Rai, Ripudeep K, MD  guaiFENesin-dextromethorphan (ROBITUSSIN DM) 100-10 MG/5ML syrup Take 5 mLs by mouth every 4 (four) hours as needed for cough (chest congestion). 04/10/23   Rai, Delene Ruffini, MD  HYDROcodone-acetaminophen (NORCO) 5-325 MG tablet Take 1-2 tablets by mouth every 4 (four) hours as needed for moderate pain (Use sparingly). Patient taking differently: Take 1 tablet by  mouth in the morning and at bedtime. 05/07/22   Marshia Ly, PA-C  methocarbamol (ROBAXIN) 500 MG tablet Take 1 tablet (500 mg total) by mouth every 8 (eight) hours as needed for muscle spasms. Patient taking differently: Take 500 mg by mouth in the morning and at bedtime. 08/08/21   Haskel Schroeder, PA-C  mirtazapine (REMERON) 15 MG tablet Take 15 mg by mouth at bedtime.    [provider]  montelukast (SINGULAIR) 10 MG tablet Take 1 tablet (10 mg total) by mouth at bedtime. 04/10/23   Rai, Delene Ruffini, MD  Multiple Vitamin (MULTIVITAMIN WITH MINERALS) TABS tablet Take  1 tablet by mouth every morning.    [provider]  pantoprazole (PROTONIX) 40 MG tablet Take 1 tablet (40 mg total) by mouth 2 (two) times daily before a meal. 04/10/23   Rai, Ripudeep K, MD  potassium chloride SA (K-DUR,KLOR-CON) 20 MEQ tablet Take 1 tablet (20 mEq total) by mouth 2 (two) times daily. 07/23/16   [name], MD  predniSONE (DELTASONE) 10 MG tablet Prednisone dosing: Take  Prednisone 40mg  (4 tabs) x 3 days, then taper to 30mg  (3 tabs) x 3 days, then 20mg  (2 tabs) x 3days, then 10mg  (1 tab) x 3days, then OFF. Dispense:  30 tabs, refills: None 04/10/23   Rai, Ripudeep K, MD  rosuvastatin (CRESTOR) 5 MG tablet Take 5 mg by mouth at bedtime. 12/20/18   [provider]  testosterone cypionate (DEPOTESTOSTERONE CYPIONATE) 200 MG/ML injection Inject 150 mg into the muscle every Saturday.    [provider]      Allergies    Patient has no known allergies.    Review of Systems   Review of Systems  Neurological:  Positive for dizziness and headaches.  All other systems reviewed and are negative.   Physical Exam Updated Vital Signs BP (!) 156/73 (BP Location: Right Arm)   Pulse 73   Temp (!) 97.5 F (36.4 C)   Resp (!) 22   Ht 5\' 9"  (1.753 m)   Wt 111.1 kg   SpO2 97%   BMI 36.18 kg/m  Physical Exam Vitals and nursing note reviewed.   82 year old male, resting comfortably and in no acute distress. Vital signs are significant for elevated blood pressure and slightly elevated respiratory rate. Oxygen saturation is 97%, which is normal. Head is normocephalic and atraumatic. PERRLA, EOMI. Oropharynx is clear. Neck is nontender and supple without adenopathy or JVD.  There are no carotid bruits. Lungs are clear without rales, wheezes, or rhonchi. Chest is nontender. Heart has regular rate and rhythm without murmur. Abdomen is soft, flat, nontender. Extremities have no cyanosis or edema, full range of motion is present. Skin is warm and dry without  rash. Neurologic: Mental status is normal, cranial nerves are intact, moves all extremities equally.  There is no nystagmus.  Dizziness is reproduced by passive head movement.  ED Results / Procedures / Treatments   Labs (all labs ordered are listed, but only abnormal results are displayed) Labs Reviewed  BASIC METABOLIC PANEL - Abnormal; Notable for the following components:      Result Value   Potassium 2.8 (*)    Glucose, Bld 102 (*)    BUN 28 (*)    Creatinine, Ser 2.32 (*)    Calcium 8.0 (*)    GFR, Estimated 27 (*)    All other components within normal limits  CBC - Abnormal; Notable for the following components:   RBC 3.48 (*)  Hemoglobin 11.8 (*)    HCT 33.6 (*)    All other components within normal limits  TROPONIN I (HIGH SENSITIVITY)  TROPONIN I (HIGH SENSITIVITY)    EKG EKG Interpretation Date/Time:  Wednesday August 26 2023 18:44:11 EDT Ventricular Rate:  76 PR Interval:  230 QRS Duration:  92 QT Interval:  424 QTC Calculation: 477 R Axis:   8  Text Interpretation: Sinus rhythm with 1st degree A-V block Nonspecific T wave abnormality Prolonged QT Abnormal ECG When compared with ECG of 25-Aug-2023 12:26, No significant change was found Confirmed by Dione Booze ([id number]) on 08/26/2023 11:06:03 PM  Radiology DG Chest 2 View  Result Date: 08/26/2023 CLINICAL DATA:  Elevated troponins EXAM: CHEST - 2 VIEW COMPARISON:  Chest x-ray 08/24/2023 FINDINGS: Generator overlies the left upper chest. Cardiac silhouette is enlarged and aorta is ectatic, unchanged. There is bibasilar atelectasis. There is no pleural effusion or pneumothorax. No acute fractures are seen. IMPRESSION: 1. Bibasilar atelectasis. 2. Stable cardiomegaly and ectatic aorta. Electronically Signed   By: Darliss Cheney M.D.   On: 08/26/2023 22:52    Procedures Procedures  Cardiac monitor shows sinus rhythm, per my interpretation.  Medications Ordered in ED Medications  potassium chloride SA  (KLOR-CON M) CR tablet 40 mEq (has no administration in time range)  potassium chloride SA (KLOR-CON M) CR tablet 40 mEq (40 mEq Oral Given 08/27/23 0235)  potassium chloride 10 mEq in 100 mL IVPB (0 mEq Intravenous Stopped 08/27/23 0553)  meclizine (ANTIVERT) tablet 25 mg (25 mg Oral Given 08/27/23 0234)    ED Course/ Medical Decision Making/ A&P                                 Medical Decision Making Amount and/or Complexity of Data Reviewed Labs: ordered. Radiology: ordered.  Risk Prescription drug management.   Dizziness which sounds like peripheral vertigo, no red flags to suggest central vertigo.  Chest pain of uncertain cause.  It does seem to be related to his vertigo, but cannot rule out cardiac cause.  History of elevated troponin which is also concerning for possible ACS.  I reviewed his electrocardiogram, my interpretation is first-degree AV block, prolonged QT interval, nonspecific T wave flattening-unchanged from prior.  Chest x-ray shows stable cardiomegaly.  Have independently viewed the images, and agree with the radiologist's interpretation.  I have reviewed his laboratory test, my interpretation is moderate to severe hypokalemia significantly lower than last value on 04/10/2023, borderline elevated random glucose level, renal insufficiency which has progressed slightly compared with 04/10/2023, mild anemia which had not been present on 04/08/2023, but had been present in January with hemoglobin in similar range.  I do note that he is on a diuretic which likely accounts for his hypokalemia.  Electrolyte disturbance could be contributing to his dizziness.  I have reviewed his past records, and note cardiology office visit on 08/25/2023 where it was advised to go to the emergency department because of history of elevated troponin.  Echocardiogram on 01/12/2023 showed normal ejection fraction with indeterminate diastolic parameters, but echocardiogram 08/10/2019 did show grade 1 diastolic  dysfunction.  I have ordered oral and intravenous potassium and I have ordered a serum magnesium level.  I have ordered a dose of meclizine and I have ordered orthostatic vital signs to be checked.  I cannot explain recent elevation in troponin with normal troponin today, suspect laboratory error for the elevated troponin.  Serum magnesium  level is normal.  He feels somewhat better following meclizine but still had dizziness with head movement so I ordered an MRI of the brain to rule out stroke.  MRI shows no evidence of acute stroke.  I have independently viewed the images, and agree with radiologist's interpretation.  I am discharging him with prescriptions for meclizine and for a higher dose of oral potassium.  He is taking 20 mill equivalents of potassium twice a day, I am instructing him to double that.  He will need to follow-up with his primary care provider in 1 week to recheck his potassium level.  Given degree of hypokalemia and ongoing diuretic use, it is possible he will need to be on an even higher dose of oral potassium.  Final Clinical Impression(s) / ED Diagnoses Final diagnoses:  Peripheral vertigo, unspecified laterality  Diuretic-induced hypokalemia  Normochromic normocytic anemia  Renal insufficiency  Elevated random blood glucose level    Rx / DC Orders ED Discharge Orders          Ordered    potassium chloride SA (KLOR-CON M) 20 MEQ tablet  2 times daily        08/27/23 0615    meclizine (ANTIVERT) 25 MG tablet  3 times daily PRN        08/27/23 0615              Dione Booze, MD 08/27/23 [phone]

## 2023-09-01 ENCOUNTER — Encounter (HOSPITAL_COMMUNITY): Payer: Self-pay

## 2023-09-01 ENCOUNTER — Telehealth (HOSPITAL_COMMUNITY): Payer: Self-pay | Admitting: *Deleted

## 2023-09-01 NOTE — Telephone Encounter (Signed)
Per DPR left detailed instructions for mpi study also sent via mychart.

## 2023-09-02 DIAGNOSIS — N179 Acute kidney failure, unspecified: Secondary | ICD-10-CM | POA: Diagnosis not present

## 2023-09-02 DIAGNOSIS — R42 Dizziness and giddiness: Secondary | ICD-10-CM | POA: Diagnosis not present

## 2023-09-02 DIAGNOSIS — R7989 Other specified abnormal findings of blood chemistry: Secondary | ICD-10-CM | POA: Diagnosis not present

## 2023-09-02 DIAGNOSIS — N183 Chronic kidney disease, stage 3 unspecified: Secondary | ICD-10-CM | POA: Diagnosis not present

## 2023-09-02 DIAGNOSIS — I890 Lymphedema, not elsewhere classified: Secondary | ICD-10-CM | POA: Diagnosis not present

## 2023-09-02 DIAGNOSIS — E876 Hypokalemia: Secondary | ICD-10-CM | POA: Diagnosis not present

## 2023-09-02 DIAGNOSIS — D649 Anemia, unspecified: Secondary | ICD-10-CM | POA: Diagnosis not present

## 2023-09-02 DIAGNOSIS — R600 Localized edema: Secondary | ICD-10-CM | POA: Diagnosis not present

## 2023-09-02 DIAGNOSIS — Z6838 Body mass index (BMI) 38.0-38.9, adult: Secondary | ICD-10-CM | POA: Diagnosis not present

## 2023-09-03 ENCOUNTER — Ambulatory Visit (HOSPITAL_COMMUNITY): Payer: Medicare Other | Attending: Cardiovascular Disease

## 2023-09-03 ENCOUNTER — Ambulatory Visit: Payer: Medicare Other | Admitting: Pulmonary Disease

## 2023-09-03 DIAGNOSIS — R079 Chest pain, unspecified: Secondary | ICD-10-CM | POA: Diagnosis not present

## 2023-09-03 DIAGNOSIS — R0789 Other chest pain: Secondary | ICD-10-CM | POA: Insufficient documentation

## 2023-09-03 DIAGNOSIS — Z23 Encounter for immunization: Secondary | ICD-10-CM | POA: Diagnosis not present

## 2023-09-03 LAB — MYOCARDIAL PERFUSION IMAGING
LV dias vol: 105 mL (ref 62–150)
LV sys vol: 38 mL
Nuc Stress EF: 64 %
Peak HR: 78 {beats}/min
Rest HR: 60 {beats}/min
Rest Nuclear Isotope Dose: 8.6 mCi
SDS: 1
SRS: 0
SSS: 1
ST Depression (mm): 0 mm
Stress Nuclear Isotope Dose: 29.3 mCi
TID: 0.93

## 2023-09-03 MED ORDER — TECHNETIUM TC 99M TETROFOSMIN IV KIT
29.3000 | PACK | Freq: Once | INTRAVENOUS | Status: AC | PRN
  Administered 2023-09-03: 29.3 via INTRAVENOUS

## 2023-09-03 MED ORDER — TECHNETIUM TC 99M TETROFOSMIN IV KIT
8.6000 | PACK | Freq: Once | INTRAVENOUS | Status: AC | PRN
  Administered 2023-09-03: 8.6 via INTRAVENOUS

## 2023-09-03 MED ORDER — REGADENOSON 0.4 MG/5ML IV SOLN
0.4000 mg | Freq: Once | INTRAVENOUS | Status: AC
Start: 2023-09-03 — End: 2023-09-03
  Administered 2023-09-03: 0.4 mg via INTRAVENOUS

## 2023-09-08 ENCOUNTER — Encounter: Payer: Self-pay | Admitting: Pulmonary Disease

## 2023-09-09 ENCOUNTER — Encounter: Payer: Self-pay | Admitting: Podiatry

## 2023-09-09 ENCOUNTER — Ambulatory Visit (INDEPENDENT_AMBULATORY_CARE_PROVIDER_SITE_OTHER): Payer: Medicare Other | Admitting: Podiatry

## 2023-09-09 VITALS — Ht 69.0 in | Wt 245.0 lb

## 2023-09-09 DIAGNOSIS — M79675 Pain in left toe(s): Secondary | ICD-10-CM | POA: Diagnosis not present

## 2023-09-09 DIAGNOSIS — M79674 Pain in right toe(s): Secondary | ICD-10-CM | POA: Diagnosis not present

## 2023-09-09 DIAGNOSIS — B351 Tinea unguium: Secondary | ICD-10-CM | POA: Diagnosis not present

## 2023-09-09 DIAGNOSIS — Z899 Acquired absence of limb, unspecified: Secondary | ICD-10-CM | POA: Diagnosis not present

## 2023-09-09 DIAGNOSIS — R7303 Prediabetes: Secondary | ICD-10-CM | POA: Diagnosis not present

## 2023-09-09 DIAGNOSIS — I872 Venous insufficiency (chronic) (peripheral): Secondary | ICD-10-CM

## 2023-09-09 NOTE — Progress Notes (Signed)
Subjective:  Patient ID: [name], male    DOB: Aug 25, 1941,   MRN: [id number]  Chief Complaint  Patient presents with   Nail Problem   Foot Pain     82 y.o. male presents as return for pain in both of his lefts. He has been lost to follow-up for a while. Relates since last seen he has had a hip replaement and lost his great toe on the right. He has been following with vein specialist and been getting unna boots once a week. . Also has concern of thickened elongated and painful nails that are difficult to trim. Requesting to have them trimmed today. Relates burning and tingling in their feet. Patient is pre-diabetic and last A1c was  Lab Results  Component Value Date   HGBA1C 5.7 (H) 04/16/2022   .   PCP:  Gweneth Dimitri, MD      Denies any other pedal complaints. Denies n/v/f/c.   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 CFT <3 seconds. Normal hair growth on digits. Edema noted to right lower extremity. Right foot significantly cold.  Overall swelling appears slightly worse.  Skin. No  lacerations or abrasions bilateral feet. Xerosis noted to bilateral lower extremities. Nails 1-5 are thickened discolored and elongated with subungual debris. Severe dirt and lack of care to the feet.  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 Bilateral unna boots present today. Musculoskeletal: MMT 5/5 bilateral lower extremities in DF, PF, Inversion and Eversion. Deceased ROM in DF of ankle joint.  Neurological: Sensation intact to light touch.   Assessment:   1. Pain due to onychomycosis of toenails of both feet   2. Prediabetes   3. Venous (peripheral) insufficiency   4. History of amputation              Plan:  Patient was evaluated and treated and all questions answered. -Discussed and educated patient on  foot care, especially with  regards to the vascular, neurological and musculoskeletal systems.  -Stressed the importance of good glycemic control and the detriment of not  controlling glucose levels in relation to the foot. -Discussed supportive shoes at all times and checking feet regularly.  -Mechanically debrided all nails 1-5 bilateral using sterile nail nipper and filed with dremel without incident  -Answered all patient questions -Patient to return  in  3 months for at risk foot care -Patient advised to call the office if any problems or questions arise in the meantime.   Return in about 3 months (around 12/10/2023) for rfc.   Louann Sjogren, DPM

## 2023-09-10 ENCOUNTER — Other Ambulatory Visit: Payer: Self-pay

## 2023-09-10 ENCOUNTER — Ambulatory Visit (INDEPENDENT_AMBULATORY_CARE_PROVIDER_SITE_OTHER): Payer: Medicare Other | Admitting: Physical Medicine and Rehabilitation

## 2023-09-10 DIAGNOSIS — M21371 Foot drop, right foot: Secondary | ICD-10-CM | POA: Diagnosis not present

## 2023-09-10 DIAGNOSIS — M5416 Radiculopathy, lumbar region: Secondary | ICD-10-CM | POA: Diagnosis not present

## 2023-09-10 DIAGNOSIS — M48062 Spinal stenosis, lumbar region with neurogenic claudication: Secondary | ICD-10-CM | POA: Diagnosis not present

## 2023-09-10 DIAGNOSIS — M4316 Spondylolisthesis, lumbar region: Secondary | ICD-10-CM | POA: Diagnosis not present

## 2023-09-10 MED ORDER — METHYLPREDNISOLONE ACETATE 40 MG/ML IJ SUSP
40.0000 mg | Freq: Once | INTRAMUSCULAR | Status: AC
Start: 2023-09-10 — End: 2023-09-10
  Administered 2023-09-10: 40 mg

## 2023-09-10 NOTE — Progress Notes (Signed)
Functional Pain Scale - descriptive words and definitions  Unmanageable (7)  Pain interferes with normal ADL's/nothing seems to help/sleep is very difficult/active distractions are very difficult to concentrate on. Severe range order  Average Pain 7  Moving around causes the most pain.  And lifting things later in the day it gets worse.  +Driver, -BT, -Dye Allergies.

## 2023-09-10 NOTE — Patient Instructions (Addendum)

## 2023-09-11 NOTE — Procedures (Signed)
Lumbosacral Transforaminal Epidural Steroid Injection - Sub-Pedicular Approach with Fluoroscopic Guidance  Patient: [name]      Date of Birth: 1941-01-29 MRN: [id number] PCP: Gweneth Dimitri, MD      Visit Date: 09/10/2023   Universal Protocol:    Date/Time: 09/10/2023  Consent Given By: the patient  Position: PRONE  Additional Comments: Vital signs were monitored before and after the procedure. Patient was prepped and draped in the usual sterile fashion. The correct patient, procedure, and site was verified.   Injection Procedure Details:   Procedure diagnoses: Lumbar radiculopathy [M54.16]    Meds Administered:  Meds ordered this encounter  Medications   methylPREDNISolone acetate (DEPO-MEDROL) injection 40 mg    Laterality: Bilateral  Location/Site: L4  Needle:5.0 in., 22 ga.  Short bevel or Quincke spinal needle  Needle Placement: Transforaminal  Findings:    -Comments: Excellent flow of contrast along the nerve, nerve root and into the epidural space.  Procedure Details: After squaring off the end-plates to get a true AP view, the C-arm was positioned so that an oblique view of the foramen as noted above was visualized. The target area is just inferior to the "nose of the scotty dog" or sub pedicular. The soft tissues overlying this structure were infiltrated with 2-3 ml. of 1% Lidocaine without Epinephrine.  The spinal needle was inserted toward the target using a "trajectory" view along the fluoroscope beam.  Under AP and lateral visualization, the needle was advanced so it did not puncture dura and was located close the 6 O'Clock position of the pedical in AP tracterory. Biplanar projections were used to confirm position. Aspiration was confirmed to be negative for CSF and/or blood. A 1-2 ml. volume of Isovue-250 was injected and flow of contrast was noted at each level. Radiographs were obtained for documentation purposes.   After attaining the desired  flow of contrast documented above, a 0.5 to 1.0 ml test dose of 0.25% Marcaine was injected into each respective transforaminal space.  The patient was observed for 90 seconds post injection.  After no sensory deficits were reported, and normal lower extremity motor function was noted,   the above injectate was administered so that equal amounts of the injectate were placed at each foramen (level) into the transforaminal epidural space.   Additional Comments:  No complications occurred Dressing: 2 x 2 sterile gauze and Band-Aid    Post-procedure details: Patient was observed during the procedure. Post-procedure instructions were reviewed.  Patient left the clinic in stable condition.

## 2023-09-11 NOTE — Progress Notes (Signed)
[name] - 82 y.o. male MRN [id number]  Date of birth: 1941/07/26  Office Visit Note: Visit Date: 09/10/2023 PCP: Gweneth Dimitri, MD Referred by: Gweneth Dimitri, MD  Subjective: Chief Complaint  Patient presents with   Lower Back - Pain   HPI:  [name] is a 82 y.o. male who comes in today at the request of Ellin Goodie, FNP for planned Bilateral L4-5 Lumbar Transforaminal epidural steroid injection with fluoroscopic guidance.  The patient has failed conservative care including home exercise, medications, time and activity modification.  This injection will be diagnostic and hopefully therapeutic.  Please see requesting physician notes for further details and justification.   ROS Otherwise per HPI.  Assessment & Plan: Visit Diagnoses:    ICD-10-CM   1. Lumbar radiculopathy  M54.16 XR C-ARM NO REPORT    Epidural Steroid injection    methylPREDNISolone acetate (DEPO-MEDROL) injection 40 mg    2. Spinal stenosis of lumbar region with neurogenic claudication  M48.062 XR C-ARM NO REPORT    Epidural Steroid injection    methylPREDNISolone acetate (DEPO-MEDROL) injection 40 mg    3. Right foot drop  M21.371 XR C-ARM NO REPORT    Epidural Steroid injection    methylPREDNISolone acetate (DEPO-MEDROL) injection 40 mg    4. Spondylolisthesis of lumbar region  M43.16 XR C-ARM NO REPORT    Epidural Steroid injection    methylPREDNISolone acetate (DEPO-MEDROL) injection 40 mg      Plan: No additional findings.   Meds & Orders:  Meds ordered this encounter  Medications   methylPREDNISolone acetate (DEPO-MEDROL) injection 40 mg    Orders Placed This Encounter  Procedures   XR C-ARM NO REPORT   Epidural Steroid injection    Follow-up: Return for visit to requesting provider as needed.   Procedures: No procedures performed  Lumbosacral Transforaminal Epidural Steroid Injection - Sub-Pedicular Approach with Fluoroscopic Guidance  Patient: [name]      Date of Birth: 14-Nov-1940 MRN: [id number] PCP: Gweneth Dimitri, MD      Visit Date: 09/10/2023   Universal Protocol:    Date/Time: 09/10/2023  Consent Given By: the patient  Position: PRONE  Additional Comments: Vital signs were monitored before and after the procedure. Patient was prepped and draped in the usual sterile fashion. The correct patient, procedure, and site was verified.   Injection Procedure Details:   Procedure diagnoses: Lumbar radiculopathy [M54.16]    Meds Administered:  Meds ordered this encounter  Medications   methylPREDNISolone acetate (DEPO-MEDROL) injection 40 mg    Laterality: Bilateral  Location/Site: L4  Needle:5.0 in., 22 ga.  Short bevel or Quincke spinal needle  Needle Placement: Transforaminal  Findings:    -Comments: Excellent flow of contrast along the nerve, nerve root and into the epidural space.  Procedure Details: After squaring off the end-plates to get a true AP view, the C-arm was positioned so that an oblique view of the foramen as noted above was visualized. The target area is just inferior to the "nose of the scotty dog" or sub pedicular. The soft tissues overlying this structure were infiltrated with 2-3 ml. of 1% Lidocaine without Epinephrine.  The spinal needle was inserted toward the target using a "trajectory" view along the fluoroscope beam.  Under AP and lateral visualization, the needle was advanced so it did not puncture dura and was located close the 6 O'Clock position of the pedical in AP tracterory. Biplanar projections were used to confirm position. Aspiration was confirmed to  be negative for CSF and/or blood. A 1-2 ml. volume of Isovue-250 was injected and flow of contrast was noted at each level. Radiographs were obtained for documentation purposes.   After attaining the desired flow of contrast documented above, a 0.5 to 1.0 ml test dose of 0.25% Marcaine was injected into each respective transforaminal  space.  The patient was observed for 90 seconds post injection.  After no sensory deficits were reported, and normal lower extremity motor function was noted,   the above injectate was administered so that equal amounts of the injectate were placed at each foramen (level) into the transforaminal epidural space.   Additional Comments:  No complications occurred Dressing: 2 x 2 sterile gauze and Band-Aid    Post-procedure details: Patient was observed during the procedure. Post-procedure instructions were reviewed.  Patient left the clinic in stable condition.    Clinical History: CT LUMBAR MYELOGRAM FINDINGS:     T10-11: No stenosis. Facet degeneration and ligamentous  calcification. Solid bridging anterior osteophytes.     T11-12: No stenosis.     T12-L1: Mild bulging of the disc. No compressive stenosis. Conus tip  at this level.     L1-2: 2 mm retrolisthesis. Moderate multifactorial spinal stenosis.  Disc space narrowing. Endplate osteophytes. Facet and ligamentous  hypertrophy. Foraminal stenosis on both sides that could compress  the L1 nerves.     L2-3: 2 mm retrolisthesis. Chronic disc degeneration with loss of  disc height and vacuum phenomenon. Endplate osteophytes. Facet and  ligamentous hypertrophy. Severe multifactorial spinal stenosis  likely to cause neural compression. Bilateral foraminal stenosis  that could compress the L2 nerves.     L3-4: 2 mm retrolisthesis. Endplate osteophytes and bulging of the  disc. Facet and ligamentous hypertrophy. Severe multifactorial  spinal stenosis likely to cause neural compression. Bilateral  foraminal stenosis that could affect the exiting L3 nerves.     L4-5: Chronic facet arthropathy with anterolisthesis of 1 cm. Disc  degeneration with disc space narrowing and vacuum phenomenon.  Endplate osteophytes and bulging of the disc. Severe stenosis of the  central canal and both neural foramina likely to cause neural   compression.     L5-S1: Chronic disc degeneration with loss of disc height and vacuum  phenomenon. Endplate osteophytes. Facet hypertrophy and degeneration  with some ligamentous calcification. Severe stenosis of the  subarticular lateral recesses and neural foramina.     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 Curvature convex to the left in the thoracolumbar  junction region and towards the right in the lower lumbar region. 2  mm retrolisthesis L1-2, L2-3 and L3-4. 1 cm anterolisthesis L4-5.     Severe multifactorial spinal stenosis and neural foraminal stenosis  likely to cause neural compression at L2-3, L3-4, L4-5 and L5-S1.  Moderate multifactorial canal stenosis at L1-2. Bilateral foraminal  stenosis at that level.        Electronically Signed    By: Paulina Fusi M.D.    On: 12/06/2019 10:12     Objective:  VS:  HT:    WT:   BMI:     BP:   HR: bpm  TEMP: ( )  RESP:  Physical Exam Vitals and nursing note reviewed.  Constitutional:      General: He is not in acute distress.    Appearance: Normal appearance. He is not ill-appearing.  HENT:     Head: Normocephalic and atraumatic.     Right Ear: External ear  normal.     Left Ear: External ear normal.     Nose: No congestion.  Eyes:     Extraocular Movements: Extraocular movements intact.  Cardiovascular:     Rate and Rhythm: Normal rate.     Pulses: Normal pulses.  Pulmonary:     Effort: Pulmonary effort is normal. No respiratory distress.  Abdominal:     General: There is no distension.     Palpations: Abdomen is soft.  Musculoskeletal:        General: Swelling and tenderness present. No signs of injury.     Cervical back: Neck supple.     Right lower leg: No edema.     Left lower leg: No edema.     Comments: Patient has good distal strength without clonus. Legs wrapped for swelling.  Skin:    Findings: No erythema or rash.  Neurological:      General: No focal deficit present.     Mental Status: He is alert and oriented to person, place, and time.     Cranial Nerves: No cranial nerve deficit.     Sensory: Sensory deficit present.     Motor: Weakness present. No abnormal muscle tone.     Coordination: Coordination normal.     Gait: Gait abnormal.  Psychiatric:        Mood and Affect: Mood normal.        Behavior: Behavior normal.      Imaging: No results found.

## 2023-09-17 DIAGNOSIS — R809 Proteinuria, unspecified: Secondary | ICD-10-CM | POA: Diagnosis not present

## 2023-09-17 DIAGNOSIS — R7303 Prediabetes: Secondary | ICD-10-CM | POA: Diagnosis not present

## 2023-09-17 DIAGNOSIS — N400 Enlarged prostate without lower urinary tract symptoms: Secondary | ICD-10-CM | POA: Diagnosis not present

## 2023-09-17 DIAGNOSIS — E876 Hypokalemia: Secondary | ICD-10-CM | POA: Diagnosis not present

## 2023-09-17 DIAGNOSIS — I129 Hypertensive chronic kidney disease with stage 1 through stage 4 chronic kidney disease, or unspecified chronic kidney disease: Secondary | ICD-10-CM | POA: Diagnosis not present

## 2023-09-17 DIAGNOSIS — R3129 Other microscopic hematuria: Secondary | ICD-10-CM | POA: Diagnosis not present

## 2023-09-17 DIAGNOSIS — I5032 Chronic diastolic (congestive) heart failure: Secondary | ICD-10-CM | POA: Diagnosis not present

## 2023-09-17 DIAGNOSIS — N1832 Chronic kidney disease, stage 3b: Secondary | ICD-10-CM | POA: Diagnosis not present

## 2023-09-18 ENCOUNTER — Other Ambulatory Visit: Payer: Self-pay | Admitting: Nephrology

## 2023-09-18 DIAGNOSIS — N1832 Chronic kidney disease, stage 3b: Secondary | ICD-10-CM

## 2023-09-18 DIAGNOSIS — N401 Enlarged prostate with lower urinary tract symptoms: Secondary | ICD-10-CM

## 2023-09-18 LAB — LAB REPORT - SCANNED
Creatinine, POC: 40.2 mg/dL
EGFR: 29

## 2023-09-24 ENCOUNTER — Encounter: Payer: Self-pay | Admitting: Registered Nurse

## 2023-09-24 ENCOUNTER — Other Ambulatory Visit: Payer: Self-pay | Admitting: Nephrology

## 2023-09-24 DIAGNOSIS — N401 Enlarged prostate with lower urinary tract symptoms: Secondary | ICD-10-CM

## 2023-09-24 DIAGNOSIS — N1832 Chronic kidney disease, stage 3b: Secondary | ICD-10-CM

## 2023-09-24 NOTE — Progress Notes (Deleted)
Cardiology Clinic Note   Patient Name: [name] Date of Encounter: 09/24/2023  Primary Care Provider:  Gweneth Dimitri, MD Primary Cardiologist:  Nanetta Batty, MD  Patient Profile     [name] 82 year old male presents to the clinic today for follow-up evaluation of his hypertension and hyperlipidemia.  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   Hyperlipidemia    Joint pain    Neuromuscular disorder (HCC)    Pre-diabetes    Prediabetes    Sleep apnea    uses CPAP nightly   SOB (shortness of breath)    Swallowing difficulty    Swelling of lower extremity    right more than leg leg   Umbilical hernia    Past Surgical History:  Procedure Laterality Date   ABDOMINAL AORTOGRAM W/LOWER EXTREMITY N/A 03/14/2020   Procedure: ABDOMINAL AORTOGRAM W/LOWER EXTREMITY;  Surgeon: Nada Libman, MD;  Location: [facility];  Service: Vascular;  Laterality: N/A;   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   AMPUTATION TOE Right 04/18/2022   Procedure: AMPUTATION RIGHT GREAT TOE;  Surgeon: Jodi Geralds, MD;  Location: [facility];  Service: Orthopedics;  Laterality: Right;   BACK SURGERY  2003   L4, L5   BACK SURGERY     CATARACT EXTRACTION, BILATERAL     ENDOVENOUS ABLATION SAPHENOUS VEIN W/ LASER Right 06-01-2014   EVLA right greater saphenous vein by Gretta Began MD     epidural steroid injections        [facility]   hernia repair  2003   HERNIA REPAIR     hernia inguinal x 2   TOTAL HIP ARTHROPLASTY Left 05/05/2022   Procedure: LEFT TOTAL HIP ARTHROPLASTY ANTERIOR APPROACH;  Surgeon: Jodi Geralds, MD;  Location: [facility];  Service: Orthopedics;  Laterality: Left;   TOTAL KNEE ARTHROPLASTY Left 03/22/2015   Procedure: TOTAL KNEE ARTHROPLASTY;  Surgeon: Cammy Copa, MD;  Location: [facility];  Service: Orthopedics;  Laterality: Left;   UMBILICAL HERNIA REPAIR N/A 06/27/2016   Procedure: LAPAROSCOPIC ASSISTED REPAIR OF UMBILICAL HERINA WITH MESH;  Surgeon: Luretha Murphy, MD;  Location: [facility];  Service: General;  Laterality: N/A;   VASCULAR SURGERY  2015   right leg    Allergies  No Known Allergies  History of Present Illness     [name] has a PMH of HTN, HLD, OSA on CPAP, mild persistent asthma, GERD, lumbar spondylolisthesis, osteomyelitis right great toe, CKD stage IIIb, DOE, chronic pain, obesity, depression, and prediabetes.  He underwent cardiac catheterization in 2008 which was normal.  He had nuclear stress testing 4/18 which showed EF 45%, mild diffuse hypokinesis and  no ischemia.  Study was low risk.  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  His repeat echocardiogram 9/20 showed normal EF, G1 DD.  His chronic DOE was felt to be multifactorial from obesity, diastolic function, and reactive airway disease.  Echocardiogram 01/12/2023 showed an EF of 60-65%, no regional wall motion abnormalities, trivial MR, mildly dilated ascending aorta measuring 38 mm.  He was seen in the hospital 5/24 with fluid volume overload.  His BNP was low.  He noted dizziness and a cardiac event monitor was ordered.  It was never worn.  Outpatient nuclear stress test was recommended.  He was felt to have hypoxia with coughing spells that could contribute to his dizziness.  He was seen by his  PCP.  He reported chest pain and dizziness.  Cardiac troponins were drawn and were noted to be 51.  His creatinine was 2.83 up from 2.06 on 524.  He refused to go to the emergency department and made cardiology appointment.  He was seen by [name] PA-C on 08/25/2023.  During that time he reported that the week prior he was feeling well.  However, he started to develop symptoms of room spinning.  His symptoms lasted for quite some time.  He felt weak and exhausted just walking.  He denied chest pain but did note indigestion and belching.  This was worse after going to the [facility] the prior day where he had sausage, eggs, and grits.  He reported some pain in his left arm.  He noted pain in his chest with taking a deep breath.  He denied active chest pain.  He was noted to have chronic lower extremity edema and was wrapping his legs regularly.  He reported drinking plenty of fluids.  Case was reviewed with Dr. Anne Fu who agreed that his troponins could be elevated due to his CKD.  Nuclear stress testing was ordered.  His stress test 09/03/2023 showed low risk and no ischemia.  It was recommended that he follow-up with GI and his PCP.  Cardiac event monitor was also ordered but has not yet resulted.  He presents to the clinic today for follow-up evaluation and states***.  *** denies chest pain, shortness of breath, lower extremity edema, fatigue, palpitations, melena, hematuria, hemoptysis, diaphoresis, weakness, presyncope, syncope, orthopnea, and PND.  Chest discomfort-denies chest pain today.  Underwent nuclear stress testing on 09/03/2023 which showed low risk and no ischemia.  His symptoms were felt to be related to dietary indiscretion and reflux.  Dizziness-continues to notice intermittent episodes of room spinning.  Blood pressure well-controlled.  Agree, appears to be related to vertigo.  Cardiac event monitor in place. Await cardiac event monitor results  Essential hypertension-BP  today***. Maintain blood pressure log Continue current medical therapy  DOE-O2 saturations***.  Chronic. Continue current medical therapy Follow-up with pulmonology  CKD-creatinine 2.32 on 08/26/2023.  Avoid nephrotoxic agents. Follows with [facility]  Venous insufficiency-bilateral lower extremity***pitting edema. Heart healthy low-sodium diet Continue lower extremity wraps Elevate lower extremities when not active   Disposition: Follow-up with Dr. Allyson Sabal or me in 4 months.  Home Medications    Prior to Admission medications   Medication Sig Start Date End Date Taking? Authorizing Provider  albuterol (VENTOLIN HFA) 108 (90 Base) MCG/ACT inhaler Inhale 2 puffs into the lungs every 6 (six) hours as needed for wheezing or shortness of breath. 04/10/23   Rai, Delene Ruffini, MD  ASTEPRO 205.5 MCG/SPRAY  SOLN Place 2 sprays into both nostrils in the morning.    [provider]  Budeson-Glycopyrrol-Formoterol (BREZTRI AEROSPHERE) 160-9-4.8 MCG/ACT AERO Inhale 2 puffs into the lungs in the morning and at bedtime.    [provider]  buPROPion (WELLBUTRIN XL) 150 MG 24 hr tablet Take 450 mg by mouth every morning.    [provider]  doxazosin (CARDURA) 8 MG tablet TAKE 1 TABLET BY MOUTH DAILY  . Take at night Patient taking differently: Take 8 mg by mouth at bedtime. 03/11/16   [name], MD  fluticasone [facility] ALLERGY RELIEF) 50 MCG/ACT nasal spray Place 2 sprays into both nostrils every evening.    [provider]  furosemide (LASIX) 40 MG tablet Take 1 tablet (40 mg total) by mouth daily. Take 40 mg by mouth in the morning and an additional 40 mg once a day as needed for unresolved swelling or fluid retention 12/17/22   Runell Gess, MD  gabapentin (NEURONTIN) 300 MG capsule TAKE 1 TO 2 CAPSULES BY MOUTH AT BEDTIME Patient taking differently: Take 600 mg by mouth at bedtime. 01/15/17   Ofilia Neas, PA-C  meclizine (ANTIVERT) 25 MG tablet Take  1 tablet (25 mg total) by mouth 3 (three) times daily as needed for dizziness. 08/27/23   Dione Booze, MD  methocarbamol (ROBAXIN) 500 MG tablet Take 1 tablet (500 mg total) by mouth every 8 (eight) hours as needed for muscle spasms. Patient taking differently: Take 500 mg by mouth in the morning and at bedtime. 08/08/21   Haskel Schroeder, PA-C  mirtazapine (REMERON) 15 MG tablet Take 15 mg by mouth at bedtime.    [provider]  montelukast (SINGULAIR) 10 MG tablet Take 1 tablet (10 mg total) by mouth at bedtime. 04/10/23   Rai, Delene Ruffini, MD  Multiple Vitamin (MULTIVITAMIN WITH MINERALS) TABS tablet Take 1 tablet by mouth every morning.    [provider]  pantoprazole (PROTONIX) 40 MG tablet Take 1 tablet (40 mg total) by mouth 2 (two) times daily before a meal. 04/10/23   Rai, Ripudeep K, MD  potassium chloride SA (KLOR-CON M) 20 MEQ tablet Take 2 tablets (40 mEq total) by mouth 2 (two) times daily. 08/27/23   Dione Booze, MD  rosuvastatin (CRESTOR) 5 MG tablet Take 5 mg by mouth at bedtime. 12/20/18   [provider]  testosterone cypionate (DEPOTESTOSTERONE CYPIONATE) 200 MG/ML injection Inject 150 mg into the muscle every Saturday.    [provider]    Family History    Family History  Problem Relation Age of Onset   Thyroid disease Mother    Heart disease Father    Hypertension Father    Diabetes Maternal Grandfather    Colon cancer Neg Hx    Esophageal cancer Neg Hx    He indicated that his mother is deceased. He indicated that his father is deceased. He indicated that his brother is alive. He indicated that his maternal grandmother is deceased. He indicated that his maternal grandfather is deceased. He indicated that his paternal grandmother is deceased. He indicated that his paternal grandfather is deceased. He indicated that two of his three daughters are alive. He indicated that the status of his neg hx is unknown.  Social History     Social History   Socioeconomic History   Marital status: Single    Spouse name: Not on file   Number of children: Not on file   Years of education: Not on  file   Highest education level: Not on file  Occupational History   Occupation: Antiques/Real Environmental manager: RETIRED  Tobacco Use   Smoking status: Never   Smokeless tobacco: Never  Vaping Use   Vaping status: Never Used  Substance and Sexual Activity   Alcohol use: Yes    Comment: rare   Drug use: No   Sexual activity: Yes    Birth control/protection: Condom  Other Topics Concern   Not on file  Social History Narrative   ** Merged History Encounter **       Single. Education: Other.    Social Determinants of Health   Financial Resource Strain: Not on file  Food Insecurity: No Food Insecurity (04/08/2023)   Hunger Vital Sign    Worried About Running Out of Food in the Last Year: Never true    Ran Out of Food in the Last Year: Never true  Transportation Needs: No Transportation Needs (04/08/2023)   PRAPARE - Administrator, Civil Service (Medical): No    Lack of Transportation (Non-Medical): No  Physical Activity: Not on file  Stress: Not on file  Social Connections: Not on file  Intimate Partner Violence: Patient Declined (04/08/2023)   Humiliation, Afraid, Rape, and Kick questionnaire    Fear of Current or Ex-Partner: Patient declined    Emotionally Abused: Patient declined    Physically Abused: Patient declined    Sexually Abused: Patient declined     Review of Systems    General:  No chills, fever, night sweats or weight changes.  Cardiovascular:  No chest pain, dyspnea on exertion, edema, orthopnea, palpitations, paroxysmal nocturnal dyspnea. Dermatological: No rash, lesions/masses Respiratory: No cough, dyspnea Urologic: No hematuria, dysuria Abdominal:   No nausea, vomiting, diarrhea, bright red blood per rectum, melena, or hematemesis Neurologic:  No visual changes, wkns, changes in  mental status. All other systems reviewed and are otherwise negative except as noted above.  Physical Exam    VS:  There were no vitals taken for this visit. , BMI There is no height or weight on file to calculate BMI. GEN: Well nourished, well developed, in no acute distress. HEENT: normal. Neck: Supple, no JVD, carotid bruits, or masses. Cardiac: RRR, no murmurs, rubs, or gallops. No clubbing, cyanosis, edema.  Radials/DP/PT 2+ and equal bilaterally.  Respiratory:  Respirations regular and unlabored, clear to auscultation bilaterally. GI: Soft, nontender, nondistended, BS + x 4. MS: no deformity or atrophy. Skin: warm and dry, no rash. Neuro:  Strength and sensation are intact. Psych: Normal affect.  Accessory Clinical Findings    Recent Labs: 04/07/2023: ALT 27; B Natriuretic Peptide 13.7 08/26/2023: BUN 28; Creatinine, Ser 2.32; Hemoglobin 11.8; Platelets 173; Potassium 2.8; Sodium 139 08/27/2023: Magnesium 2.0   Recent Lipid Panel    Component Value Date/Time   CHOL 175 03/11/2016 1340   TRIG 199 (H) 03/11/2016 1340   HDL 38 (L) 03/11/2016 1340   CHOLHDL 4.6 03/11/2016 1340   VLDL 40 (H) 03/11/2016 1340   LDLCALC 97 03/11/2016 1340    No BP recorded.  {Refresh Note OR Click here to enter BP  :1}***    ECG personally reviewed by me today- ***     Nuclear stress test 09/03/2023    The study is normal. Findings are consistent with no ischemia and no infarction. The study is low risk.   No ST deviation was noted.   LV perfusion is normal. There is no evidence of ischemia. There is  no evidence of infarction.   Left ventricular function is normal. Nuclear stress EF: 64%. The left ventricular ejection fraction is normal (55-65%). End diastolic cavity size is normal. End systolic cavity size is normal.   Prior study available for comparison from 02/19/2017.  Since that time, LVEF has improved.   Patient transiently developed rate controlled atrial fibrillation after  infusion of Lexiscan.       Assessment & Plan   1.  ***   Thomasene Ripple. Cleveland Yarbro NP-C     09/24/2023, 9:46 AM [facility] [address] Office [phone] Fax [phone]    I spent***minutes examining this patient, reviewing medications, and using patient centered shared decision making involving her cardiac care.   I spent greater than 20 minutes reviewing her past medical history,  medications, and prior cardiac tests.

## 2023-09-28 ENCOUNTER — Ambulatory Visit: Payer: Medicare Other | Attending: General Practice | Admitting: General Practice

## 2023-09-28 ENCOUNTER — Other Ambulatory Visit: Payer: Medicare Other

## 2023-09-30 ENCOUNTER — Ambulatory Visit
Admission: RE | Admit: 2023-09-30 | Discharge: 2023-09-30 | Disposition: A | Discharge status: Home / Self Care | Payer: Medicare Other | Source: Ambulatory Visit | Attending: Nephrology | Admitting: Nephrology

## 2023-09-30 DIAGNOSIS — N132 Hydronephrosis with renal and ureteral calculous obstruction: Secondary | ICD-10-CM | POA: Diagnosis not present

## 2023-09-30 DIAGNOSIS — N401 Enlarged prostate with lower urinary tract symptoms: Secondary | ICD-10-CM

## 2023-09-30 DIAGNOSIS — N1832 Chronic kidney disease, stage 3b: Secondary | ICD-10-CM

## 2023-09-30 DIAGNOSIS — L2989 Other pruritus: Secondary | ICD-10-CM | POA: Diagnosis not present

## 2023-09-30 DIAGNOSIS — L820 Inflamed seborrheic keratosis: Secondary | ICD-10-CM | POA: Diagnosis not present

## 2023-09-30 DIAGNOSIS — L570 Actinic keratosis: Secondary | ICD-10-CM | POA: Diagnosis not present

## 2023-09-30 DIAGNOSIS — D225 Melanocytic nevi of trunk: Secondary | ICD-10-CM | POA: Diagnosis not present

## 2023-09-30 DIAGNOSIS — L814 Other melanin hyperpigmentation: Secondary | ICD-10-CM | POA: Diagnosis not present

## 2023-09-30 DIAGNOSIS — Z08 Encounter for follow-up examination after completed treatment for malignant neoplasm: Secondary | ICD-10-CM | POA: Diagnosis not present

## 2023-09-30 DIAGNOSIS — L821 Other seborrheic keratosis: Secondary | ICD-10-CM | POA: Diagnosis not present

## 2023-09-30 DIAGNOSIS — Z85828 Personal history of other malignant neoplasm of skin: Secondary | ICD-10-CM | POA: Diagnosis not present

## 2023-10-02 DIAGNOSIS — I890 Lymphedema, not elsewhere classified: Secondary | ICD-10-CM | POA: Diagnosis not present

## 2023-10-03 ENCOUNTER — Other Ambulatory Visit: Payer: Self-pay

## 2023-10-03 ENCOUNTER — Emergency Department (HOSPITAL_COMMUNITY)
Admission: EM | Admit: 2023-10-03 | Discharge: 2023-10-03 | Disposition: A | Discharge status: Home / Self Care | Payer: Medicare Other | Attending: Emergency Medicine | Admitting: Emergency Medicine

## 2023-10-03 ENCOUNTER — Encounter (HOSPITAL_COMMUNITY): Payer: Self-pay

## 2023-10-03 ENCOUNTER — Emergency Department (HOSPITAL_COMMUNITY): Payer: Medicare Other

## 2023-10-03 DIAGNOSIS — T148XXA Other injury of unspecified body region, initial encounter: Secondary | ICD-10-CM

## 2023-10-03 DIAGNOSIS — S80212A Abrasion, left knee, initial encounter: Secondary | ICD-10-CM | POA: Insufficient documentation

## 2023-10-03 DIAGNOSIS — W228XXA Striking against or struck by other objects, initial encounter: Secondary | ICD-10-CM | POA: Diagnosis not present

## 2023-10-03 DIAGNOSIS — S8992XA Unspecified injury of left lower leg, initial encounter: Secondary | ICD-10-CM | POA: Diagnosis present

## 2023-10-03 DIAGNOSIS — Z96652 Presence of left artificial knee joint: Secondary | ICD-10-CM | POA: Diagnosis not present

## 2023-10-03 DIAGNOSIS — M25562 Pain in left knee: Secondary | ICD-10-CM

## 2023-10-03 NOTE — Discharge Instructions (Addendum)
You were seen in the emergency department today for knee pain.  Your imaging is thankfully reassuring with no evidence of any fracture, dislocation or disruption of your prosthetics.  I would recommend following up with your primary care provider for repeat evaluation or orthopedic surgeon for further valuation.  Please return to the emergency department if you have any acute worsening in symptoms.  If any symptoms of infection arise, return the emergency department or follow-up with your primary care provider.
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
## 2023-10-04 DIAGNOSIS — M25562 Pain in left knee: Secondary | ICD-10-CM | POA: Diagnosis not present

## 2023-10-12 DIAGNOSIS — N401 Enlarged prostate with lower urinary tract symptoms: Secondary | ICD-10-CM | POA: Diagnosis not present

## 2023-10-13 DIAGNOSIS — R42 Dizziness and giddiness: Secondary | ICD-10-CM | POA: Diagnosis not present

## 2023-10-19 DIAGNOSIS — N3941 Urge incontinence: Secondary | ICD-10-CM | POA: Diagnosis not present

## 2023-10-19 DIAGNOSIS — K449 Diaphragmatic hernia without obstruction or gangrene: Secondary | ICD-10-CM | POA: Diagnosis not present

## 2023-10-19 DIAGNOSIS — N132 Hydronephrosis with renal and ureteral calculous obstruction: Secondary | ICD-10-CM | POA: Diagnosis not present

## 2023-10-19 DIAGNOSIS — N200 Calculus of kidney: Secondary | ICD-10-CM | POA: Diagnosis not present

## 2023-10-19 DIAGNOSIS — N130 Hydronephrosis with ureteropelvic junction obstruction: Secondary | ICD-10-CM | POA: Diagnosis not present

## 2023-10-19 DIAGNOSIS — E291 Testicular hypofunction: Secondary | ICD-10-CM | POA: Diagnosis not present

## 2023-10-19 DIAGNOSIS — N134 Hydroureter: Secondary | ICD-10-CM | POA: Diagnosis not present

## 2023-10-20 DIAGNOSIS — I890 Lymphedema, not elsewhere classified: Secondary | ICD-10-CM | POA: Diagnosis not present

## 2023-10-22 ENCOUNTER — Encounter: Payer: Self-pay | Admitting: Physician Assistant

## 2023-10-22 ENCOUNTER — Ambulatory Visit: Payer: Medicare Other | Admitting: Physician Assistant

## 2023-10-22 VITALS — BP 122/70 | HR 63 | Ht 69.0 in | Wt 256.0 lb

## 2023-10-22 DIAGNOSIS — Z860101 Personal history of adenomatous and serrated colon polyps: Secondary | ICD-10-CM

## 2023-10-22 DIAGNOSIS — K5900 Constipation, unspecified: Secondary | ICD-10-CM

## 2023-10-22 DIAGNOSIS — R1013 Epigastric pain: Secondary | ICD-10-CM

## 2023-10-22 DIAGNOSIS — K2270 Barrett's esophagus without dysplasia: Secondary | ICD-10-CM

## 2023-10-22 DIAGNOSIS — K219 Gastro-esophageal reflux disease without esophagitis: Secondary | ICD-10-CM

## 2023-10-22 MED ORDER — FAMOTIDINE 40 MG PO TABS: 40.0000 mg | ORAL_TABLET | Freq: Two times a day (BID) | ORAL | 6 refills | Status: DC

## 2023-10-22 NOTE — Patient Instructions (Signed)
  Start Miralax 1 capful daily in 8 ounces of liquid.  We have sent the following medications to your pharmacy for you to pick up at your convenience: pepcid _______________________________________________________  If your blood pressure at your visit was 140/90 or greater, please contact your primary care physician to follow up on this.  _______________________________________________________  If you are age 82 or older, your body mass index should be between 23-30. Your Body mass index is 37.8 kg/m. If this is out of the aforementioned range listed, please consider follow up with your Primary Care Provider.  If you are age 55 or younger, your body mass index should be between 19-25. Your Body mass index is 37.8 kg/m. If this is out of the aformentioned range listed, please consider follow up with your Primary Care Provider.   ________________________________________________________  The [facility] providers would like to encourage you to use [facility] to communicate with providers for non-urgent requests or questions.  Due to long hold times on the telephone, sending your provider a message by [facility] may be a faster and more efficient way to get a response.  Please allow 48 business hours for a response.  Please remember that this is for non-urgent requests.  _______________________________________________________ It was a pleasure to see you today!  Thank you for trusting me with your gastrointestinal care!

## 2023-10-22 NOTE — Progress Notes (Signed)
Subjective:    Patient ID: [name], male    DOB: 07-31-1941, 82 y.o.   MRN: [id number]  HPI [name] is an 82 year old white male, previously established with Dr. Orvan Falconer who comes in today to discuss possible EGD and colonoscopy.  He was referred back by Dr. Selena Batten. He relates that he has had a lot of health issues recently, has had worsening chronic kidney disease and had been seen by nephrology within the past couple of months.  He has been on chronic PPI therapy with pantoprazole and that had been stopped due to concerns about chronic kidney disease.  He thinks that he has had another medication to take but cannot tell me the name of that medication today.  He says that his acid reflux symptoms have been worse over the past couple of months.  He says if he eats much at all he will feel full and uncomfortable and almost ill in his upper abdomen.  He also develops a lot of belching and burping which will go on for sometimes an hour after eating.  He says he has had a lot of issues with heartburn and indigestion in the past but has never felt actually sick with the symptoms or had such episodes with belching burping and some nausea in the past.  He is not having any dysphagia or odynophagia.  He is not having any pain radiating into his back, no fever or chills, no diarrhea.  He has on occasion had some pain in his right mid abdomen, and feels that he has been more constipated recently as well. He did undergo some recent cardiac workup with Myoview stress testing which was normal and EF documented at 55 to 65%. He also had Zio patch monitoring which was unremarkable. He has history of hypertension, sleep apnea without oxygen use, asthma, tracheobronchial malacia, chronic kidney disease stage IIIb, anxiety and depression.  Prior colonoscopy had been done in October 2021 per Dr. Orvan Falconer for follow-up of adenomatous colon polyps.  He was noted to have multiple diverticuli in the left colon,  had had a total of 7 diminutive polyps removed all 1 to 2 mm and random biopsies.  Path on the polyps combination of tubular adenoma, sessile serrated and hyperplastic.  And follow-up in 3 years was to be considered. He also had EGD at that time and was found to have short segment Barrett's esophagus, no dysplasia multiple gastric polyps which were fundic land polyps, duodenal and gastric biopsies were negative, no H. pylori.   Review of Systems Pertinent positive and negative review of systems were noted in the above HPI section.  All other review of systems was otherwise negative.   Outpatient Encounter Medications as of 10/22/2023  Medication Sig   albuterol (VENTOLIN HFA) 108 (90 Base) MCG/ACT inhaler Inhale 2 puffs into the lungs every 6 (six) hours as needed for wheezing or shortness of breath.   ASTEPRO 205.5 MCG/SPRAY SOLN Place 2 sprays into both nostrils in the morning.   buPROPion (WELLBUTRIN XL) 150 MG 24 hr tablet Take 450 mg by mouth every morning.   famotidine (PEPCID) 40 MG tablet Take 1 tablet (40 mg total) by mouth 2 (two) times daily.   fluticasone (FLONASE ALLERGY RELIEF) 50 MCG/ACT nasal spray Place 2 sprays into both nostrils every evening.   furosemide (LASIX) 40 MG tablet Take 1 tablet (40 mg total) by mouth daily. Take 40 mg by mouth in the morning and an additional 40 mg once a day  as needed for unresolved swelling or fluid retention   gabapentin (NEURONTIN) 300 MG capsule TAKE 1 TO 2 CAPSULES BY MOUTH AT BEDTIME (Patient taking differently: Take 600 mg by mouth at bedtime.)   methocarbamol (ROBAXIN) 500 MG tablet Take 1 tablet (500 mg total) by mouth every 8 (eight) hours as needed for muscle spasms. (Patient taking differently: Take 500 mg by mouth in the morning and at bedtime.)   mirtazapine (REMERON) 15 MG tablet Take 15 mg by mouth at bedtime.   Multiple Vitamin (MULTIVITAMIN WITH MINERALS) TABS tablet Take 1 tablet by mouth every morning.   pantoprazole (PROTONIX)  40 MG tablet Take 1 tablet (40 mg total) by mouth 2 (two) times daily before a meal.   potassium chloride SA (KLOR-CON M) 20 MEQ tablet Take 2 tablets (40 mEq total) by mouth 2 (two) times daily.   rosuvastatin (CRESTOR) 5 MG tablet Take 5 mg by mouth at bedtime.   tamsulosin (FLOMAX) 0.4 MG CAPS capsule Take 0.4 mg by mouth.   Budeson-Glycopyrrol-Formoterol (BREZTRI AEROSPHERE) 160-9-4.8 MCG/ACT AERO Inhale 2 puffs into the lungs in the morning and at bedtime. (Patient not taking: Reported on 10/22/2023)   doxazosin (CARDURA) 8 MG tablet TAKE 1 TABLET BY MOUTH DAILY  . Take at night (Patient not taking: Reported on 10/22/2023)   meclizine (ANTIVERT) 25 MG tablet Take 1 tablet (25 mg total) by mouth 3 (three) times daily as needed for dizziness. (Patient not taking: Reported on 10/22/2023)   montelukast (SINGULAIR) 10 MG tablet Take 1 tablet (10 mg total) by mouth at bedtime. (Patient not taking: Reported on 10/22/2023)   testosterone cypionate (DEPOTESTOSTERONE CYPIONATE) 200 MG/ML injection Inject 150 mg into the muscle every Saturday. (Patient not taking: Reported on 10/22/2023)   No facility-administered encounter medications on file as of 10/22/2023.   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   Aspiration pneumonia (HCC) 04/07/2023   Asthma 04/07/2023   Acute respiratory failure with hypoxia (HCC) 11/18/2022   OSA (obstructive sleep apnea) 11/18/2022   Pulmonary nodules 11/18/2022   Blood in stool 11/18/2022   Influenza A with pneumonia 11/13/2022   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/2022   Primary osteoarthritis of left hip 04/23/2022   Osteomyelitis of great toe of right foot (HCC) 04/18/2022   Left hip pain 04/09/2022   Upper airway cough syndrome 02/03/2022   Acute rhinosinusitis 01/24/2022   Hypertension 09/30/2021   Allergic rhinitis due to pollen 03/25/2021   Benign prostatic hyperplasia with lower urinary  tract symptoms 03/25/2021   Chronic fatigue syndrome 03/25/2021   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   Stage 3b chronic kidney disease (CKD) (HCC) 03/25/2021   Chronic pain 03/25/2021   Dyslipidemia 03/25/2021   Edema 03/25/2021   Generalized anxiety disorder 03/25/2021   Mild persistent asthma 03/25/2021   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   Obesity (BMI 30-39.9) 03/25/2021   Non-pressure chronic ulcer of other part of right foot limited to breakdown of skin (HCC) 03/25/2021   Prediabetes 03/25/2021   Primary insomnia 03/25/2021   Sleep disturbance 03/25/2021   Paresthesia of skin 06/08/2020   Right foot drop 06/08/2020   Body mass index (BMI) 35.0-35.9, adult 03/13/2020   Spondylosis of cervical region without myelopathy or radiculopathy 01/05/2020   Elevated blood-pressure reading, without diagnosis of hypertension 12/07/2019   Spinal stenosis of lumbar region with neurogenic claudication 01/15/2017   Spondylolisthesis of lumbar region 01/15/2017   Claw toe, acquired, left 01/06/2017  Achilles tendon contracture, bilateral 01/06/2017   Pain in metatarsus of both feet 01/06/2017   Tubular adenoma of colon 12/29/2016   Basal cell carcinoma 08/19/2016   Umbilical hernia s/p lap repair with mesh 06/27/2016 06/27/2016   Degenerative arthritis of knee 03/22/2015   Dyspnea on exertion 01/31/2015   Lumbar radiculopathy 12/12/2014   Facet hypertrophy of lumbar region 12/12/2014   Left knee pain 12/12/2014   Ulcer of lower limb (HCC) 01/10/2014   Varicose veins of bilateral lower extremities with other complications 01/10/2014   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 12/21/2013   Renal insufficiency 12/21/2013   Swelling in head/neck 07/28/2012   Venous (peripheral) insufficiency 05/25/2012   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   Pain in limb 02/12/2012   Hyperlipidemia 02/28/2008   Allergic rhinitis 02/28/2008   Sleep apnea 02/28/2008   Social History    Socioeconomic History   Marital status: Single    Spouse name: Not on file   Number of children: 2   Years of education: Not on file   Highest education level: Not on file  Occupational History   Occupation: Antiques/Real Environmental manager: RETIRED   Occupation: retired  Tobacco Use   Smoking status: Never   Smokeless tobacco: Never  Vaping Use   Vaping status: Never Used  Substance and Sexual Activity   Alcohol use: Yes    Comment: rare   Drug use: No   Sexual activity: Yes    Birth control/protection: Condom  Other Topics Concern   Not on file  Social History Narrative   ** Merged History Encounter **       Single. Education: Other.    Social Drivers of Corporate investment banker Strain: Not on file  Food Insecurity: No Food Insecurity (04/08/2023)   Hunger Vital Sign    Worried About Running Out of Food in the Last Year: Never true    Ran Out of Food in the Last Year: Never true  Transportation Needs: No Transportation Needs (04/08/2023)   PRAPARE - Administrator, Civil Service (Medical): No    Lack of Transportation (Non-Medical): No  Physical Activity: Not on file  Stress: Not on file  Social Connections: Not on file  Intimate Partner Violence: Patient Declined (04/08/2023)   Humiliation, Afraid, Rape, and Kick questionnaire    Fear of Current or Ex-Partner: Patient declined    Emotionally Abused: Patient declined    Physically Abused: Patient declined    Sexually Abused: Patient declined    [name] family history includes Diabetes in his maternal grandfather; Heart disease in his father; Hypertension in his father; Thyroid disease in his mother.      Objective:    Vitals:   10/22/23 0951  BP: 122/70  Pulse: 63    Physical Exam Well-developed well-nourished elderly white male in no acute distress.  Pleasant, height, Weight, 256 BMI 37.8  HEENT; nontraumatic normocephalic, EOMI, PE R LA, sclera anicteric. Oropharynx; not examined  today Neck; supple, no JVD Cardiovascular; regular rate and rhythm with S1-S2, no murmur rub or gallop Pulmonary; Clear bilaterally Abdomen; soft, obese, nontender, nondistended, no palpable mass or hepatosplenomegaly, bowel sounds are active Rectal; not done today Skin; benign exam, no jaundice rash or appreciable lesions Extremities; 1-2+ edema bilateral lower extremities Neuro/Psych; alert and oriented x4, grossly nonfocal mood and affect appropriate        Assessment & Plan:   #67  82 year old white male with history of longstanding chronic  GERD, now off PPI therapy over the past couple of months for nephrology due to progression of chronic kidney disease. Patient has had significant daily belching and burping, epigastric discomfort, intermittent queasiness/nausea over the past couple of months. I suspect the symptoms are secondary to poorly controlled chronic GERD, will also need to consider other intra-abdominal pathology, biliary colic etc.  #2 history of Barrett's esophagus initial diagnosis October 2021, and due for surveillance #3 history of adenomatous and sessile serrated polyps-last colonoscopy October 2021 with 7 diminutive polyps removed #4 mild constipation #5 obesity 6.  Sleep apnea with CPAP use no oxygen 7.  Chronic lower extremity edema 8.  Recent negative Myoview stress test, and EF documented 60 to 65% at that time 9.  Chronic exertional dyspnea #10 chronic kidney disease stage III 11.  BPH 10.  Hypertension #11 history of kidney stones  Plan; strict antireflux regimen,  Start famotidine 40 mg p.o. twice daily AC, prescription sent We discussed starting MiraLAX 17 g in 8 ounces of water daily for constipation symptoms. Due to his advanced age of 51 and other comorbidities, I do not feel that he needs to do further surveillance colonoscopies and this was discussed with him. We can consider repeat EGD for surveillance of Barrett's,. Would also like to proceed  with upper abdominal ultrasound, and obtain copy of his recent CT scan done by urology for review. If he does not have improvement of his upper GI symptoms with twice daily famotidine, then we may need to schedule for EGD and do further imaging. Patient was in a hurry as he had an appointment in [place] early this afternoon, we will need to call him and arrange for the upper abdominal ultrasound. Patient will be established with Dr. Adela Lank.  Kalese Ensz Oswald Hillock PA-C 10/22/2023   Cc: Gweneth Dimitri, MD

## 2023-10-23 ENCOUNTER — Other Ambulatory Visit: Payer: Self-pay

## 2023-10-23 ENCOUNTER — Other Ambulatory Visit: Payer: Self-pay | Admitting: Urology

## 2023-10-23 ENCOUNTER — Telehealth: Payer: Self-pay | Admitting: Cardiovascular Disease

## 2023-10-23 DIAGNOSIS — R142 Eructation: Secondary | ICD-10-CM

## 2023-10-23 DIAGNOSIS — R1013 Epigastric pain: Secondary | ICD-10-CM

## 2023-10-23 DIAGNOSIS — R140 Abdominal distension (gaseous): Secondary | ICD-10-CM

## 2023-10-23 NOTE — Progress Notes (Signed)
Agree with assessment and plan as outlined.  Difficult situation - he does have a short segment of Barrett's, ideally would like him on PPI to reduce risk of esophageal cancer from this segment, but understand with progressive CKD it is possible PPI could be contributing to that and how it was held. Agree with starting pepcid. May consider EGD to re-evaluate his esophagus off PPI to make sure no interval worsening of his Barrett's / progression.

## 2023-10-23 NOTE — Telephone Encounter (Signed)
   Pre-operative Risk Assessment    Patient Name: [name]  DOB: 1941/06/20 MRN: [id number]      Request for Surgical Clearance    Procedure:   Urethroscopy and laser of kidney stone   Date of Surgery:  Clearance 12/29/23                                 Surgeon:  Dr. Jovita Kussmaul  Surgeon's Group or Practice Name:  [facility]  Phone number:  [phone] Fax number:  [phone]   Type of Clearance Requested:   - Medical    Type of Anesthesia:  General    Additional requests/questions:  Does this patient need antibiotics?    Signed, Filomena Jungling   10/23/2023, 4:34 PM

## 2023-10-23 NOTE — Telephone Encounter (Signed)
.    Name: [name]  DOB: 30-Mar-1941  MRN: [id number]  Primary Cardiologist: Nanetta Batty, MD  Chart reviewed as part of pre-operative protocol coverage. The patient has an upcoming visit scheduled with Dr. Allyson Sabal on 11/16/2023 at which time clearance can be addressed in case there are any issues that would impact surgical recommendations.  Urethroscopy and laser kidney stone not scheduled until 12/29/2023 as below. I added preop FYI to appointment note so that provider is aware to address at time of outpatient visit.  Per office protocol the cardiology provider should forward their finalized clearance decision and recommendations regarding antiplatelet therapy to the requesting party below.    Patient does not need antibiotics.  I will route this message as FYI to requesting party and remove this message from the preop box as separate preop APP input not needed at this time.   Please call with any questions.  Joni Reining, NP  10/23/2023, 4:39 PM

## 2023-10-26 ENCOUNTER — Telehealth: Payer: Self-pay

## 2023-10-26 ENCOUNTER — Other Ambulatory Visit: Payer: Self-pay | Admitting: Urology

## 2023-10-26 NOTE — Telephone Encounter (Signed)
Called urology to get ct scan faxed over- was sent to voicemail by the medical records department- left voicemail and fax number

## 2023-10-26 NOTE — Telephone Encounter (Signed)
Appointment notes updated for surgical clearance.

## 2023-10-27 ENCOUNTER — Ambulatory Visit (HOSPITAL_COMMUNITY)
Admission: RE | Admit: 2023-10-27 | Discharge: 2023-10-27 | Disposition: A | Discharge status: Home / Self Care | Payer: Medicare Other | Source: Ambulatory Visit | Attending: Physician Assistant | Admitting: Physician Assistant

## 2023-10-27 DIAGNOSIS — R1013 Epigastric pain: Secondary | ICD-10-CM | POA: Insufficient documentation

## 2023-10-27 DIAGNOSIS — R142 Eructation: Secondary | ICD-10-CM | POA: Diagnosis not present

## 2023-10-27 DIAGNOSIS — I890 Lymphedema, not elsewhere classified: Secondary | ICD-10-CM | POA: Diagnosis not present

## 2023-10-27 DIAGNOSIS — N200 Calculus of kidney: Secondary | ICD-10-CM | POA: Diagnosis not present

## 2023-10-28 DIAGNOSIS — M1711 Unilateral primary osteoarthritis, right knee: Secondary | ICD-10-CM | POA: Diagnosis not present

## 2023-11-16 ENCOUNTER — Ambulatory Visit: Payer: Medicare Other | Attending: Cardiovascular Disease | Admitting: Cardiovascular Disease

## 2023-11-19 DIAGNOSIS — H0288A Meibomian gland dysfunction right eye, upper and lower eyelids: Secondary | ICD-10-CM | POA: Diagnosis not present

## 2023-11-19 DIAGNOSIS — H35373 Puckering of macula, bilateral: Secondary | ICD-10-CM | POA: Diagnosis not present

## 2023-11-19 DIAGNOSIS — Z961 Presence of intraocular lens: Secondary | ICD-10-CM | POA: Diagnosis not present

## 2023-11-19 DIAGNOSIS — H02834 Dermatochalasis of left upper eyelid: Secondary | ICD-10-CM | POA: Diagnosis not present

## 2023-11-19 DIAGNOSIS — H35342 Macular cyst, hole, or pseudohole, left eye: Secondary | ICD-10-CM | POA: Diagnosis not present

## 2023-11-19 DIAGNOSIS — H04123 Dry eye syndrome of bilateral lacrimal glands: Secondary | ICD-10-CM | POA: Diagnosis not present

## 2023-11-19 DIAGNOSIS — H02831 Dermatochalasis of right upper eyelid: Secondary | ICD-10-CM | POA: Diagnosis not present

## 2023-11-19 DIAGNOSIS — H0288B Meibomian gland dysfunction left eye, upper and lower eyelids: Secondary | ICD-10-CM | POA: Diagnosis not present

## 2023-11-30 ENCOUNTER — Other Ambulatory Visit: Payer: Self-pay | Admitting: Cardiovascular Disease

## 2023-12-02 ENCOUNTER — Ambulatory Visit (HOSPITAL_COMMUNITY)
Admission: EM | Admit: 2023-12-02 | Discharge: 2023-12-02 | Disposition: A | Discharge status: Home / Self Care | Payer: Medicare Other | Attending: Family Medicine | Admitting: Family Medicine

## 2023-12-02 ENCOUNTER — Encounter (HOSPITAL_COMMUNITY): Payer: Self-pay | Admitting: Emergency Medicine

## 2023-12-02 DIAGNOSIS — S80811A Abrasion, right lower leg, initial encounter: Secondary | ICD-10-CM

## 2023-12-02 MED ORDER — DOXYCYCLINE HYCLATE 100 MG PO CAPS: 100.0000 mg | ORAL_CAPSULE | Freq: Two times a day (BID) | ORAL | 0 refills | Status: DC

## 2023-12-02 NOTE — ED Triage Notes (Signed)
Patient states he obtained an abrasion on his RLE 3 days ago and states it looks infected.

## 2023-12-03 DIAGNOSIS — I83893 Varicose veins of bilateral lower extremities with other complications: Secondary | ICD-10-CM | POA: Diagnosis not present

## 2023-12-03 DIAGNOSIS — I890 Lymphedema, not elsewhere classified: Secondary | ICD-10-CM | POA: Diagnosis not present

## 2023-12-03 NOTE — ED Provider Notes (Signed)
[facility]   [id number] 12/02/23 Arrival Time: 1956  ASSESSMENT & PLAN:  1. Abrasion of right lower extremity, initial encounter    Will be cautious and put on doxy for any developing skin infection. Sees vascular tomorrow. Meds ordered this encounter  Medications   DISCONTD: doxycycline (VIBRAMYCIN) 100 MG capsule    Sig: Take 1 capsule (100 mg total) by mouth 2 (two) times daily.    Dispense:  14 capsule    Refill:  0   doxycycline (VIBRAMYCIN) 100 MG capsule    Sig: Take 1 capsule (100 mg total) by mouth 2 (two) times daily.    Dispense:  14 capsule    Refill:  0      Follow-up Information     Gweneth Dimitri, MD.   Specialty: Family Medicine Why: As needed. Contact information: [address] [place] [address] [phone]                 Reviewed expectations re: course of current medical issues. Questions answered. Outlined signs and symptoms indicating need for more acute intervention. Understanding verbalized. After Visit Summary given.   SUBJECTIVE: History from: Patient. [name] is a 83 y.o. male. H/O LE edema and vasc insufficiency. Patient states he obtained an abrasion on his RLE 3 days ago and states it looks infected.  Denies bleeding. Yellowish crusting. Denies fever. Is ambulatory.  OBJECTIVE:  Vitals:   12/02/23 2019  BP: (!) 155/76  Pulse: 94  Resp: 18  Temp: 97.8 F (36.6 C)  TempSrc: Oral  SpO2: 95%    General appearance: alert; no distress Extremities: edematous with venous stasis changes; lateral R lower ext with approx 3x3 cm abrasion; some yellowish crusting; without bleeding/drainage; is TTP Skin: warm and dry Neurologic: normal gait Psychological: alert and cooperative; normal mood and affect   No Known Allergies  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   Hyperlipidemia    Joint pain    Neuromuscular disorder (HCC)    Pre-diabetes    Prediabetes    Sleep apnea    uses CPAP nightly   SOB (shortness of breath)    Swallowing difficulty    Swelling of lower extremity    right more than leg leg   Umbilical hernia    Social History   Socioeconomic History   Marital status: Single    Spouse name: Not on file   Number of children: 2   Years of education: Not on file   Highest education level: Not on file  Occupational History   Occupation: Antiques/Real Environmental manager: RETIRED   Occupation: retired  Tobacco Use   Smoking status: Never   Smokeless tobacco: Never  Vaping Use   Vaping status: Never  Used  Substance and Sexual Activity   Alcohol use: Yes    Comment: rare   Drug use: No   Sexual activity: Yes    Birth control/protection: Condom  Other Topics Concern   Not on file  Social History Narrative   ** Merged History Encounter **       Single. Education: Other.    Social Drivers of Corporate investment banker Strain: Not on file  Food Insecurity: No Food Insecurity (04/08/2023)   Hunger Vital Sign    Worried About Running Out of Food in the Last Year: Never true    Ran Out of Food in the Last Year: Never true  Transportation Needs: No Transportation Needs (04/08/2023)   PRAPARE - Administrator, Civil Service (Medical): No    Lack of Transportation (Non-Medical): No  Physical Activity: Not on file  Stress: Not on file  Social Connections: Not on file  Intimate Partner  Violence: Patient Declined (04/08/2023)   Humiliation, Afraid, Rape, and Kick questionnaire    Fear of Current or Ex-Partner: Patient declined    Emotionally Abused: Patient declined    Physically Abused: Patient declined    Sexually Abused: Patient declined   Family History  Problem Relation Age of Onset   Thyroid disease Mother    Heart disease Father    Hypertension Father    Diabetes Maternal Grandfather    Colon cancer Neg Hx    Esophageal cancer Neg Hx    Past Surgical History:  Procedure Laterality Date   ABDOMINAL AORTOGRAM W/LOWER EXTREMITY N/A 03/14/2020   Procedure: ABDOMINAL AORTOGRAM W/LOWER EXTREMITY;  Surgeon: Nada Libman, MD;  Location: [facility];  Service: Vascular;  Laterality: N/A;   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   AMPUTATION TOE Right 04/18/2022   Procedure: AMPUTATION RIGHT GREAT TOE;  Surgeon: Jodi Geralds, MD;  Location: [facility];  Service: Orthopedics;  Laterality: Right;   BACK SURGERY  2003   L4, L5   BACK SURGERY     CATARACT EXTRACTION, BILATERAL     ENDOVENOUS ABLATION SAPHENOUS VEIN W/ LASER Right 06-01-2014   EVLA right greater saphenous vein by Gretta Began MD    epidural steroid injections        [facility]   hernia repair  2003   HERNIA REPAIR     hernia inguinal x 2   TOTAL HIP ARTHROPLASTY Left 05/05/2022   Procedure: LEFT TOTAL HIP ARTHROPLASTY ANTERIOR APPROACH;  Surgeon: Jodi Geralds, MD;  Location: [facility];  Service: Orthopedics;  Laterality: Left;   TOTAL KNEE ARTHROPLASTY Left 03/22/2015   Procedure: TOTAL KNEE ARTHROPLASTY;  Surgeon: Cammy Copa, MD;  Location: [facility];  Service: Orthopedics;  Laterality: Left;   UMBILICAL HERNIA REPAIR N/A 06/27/2016   Procedure: LAPAROSCOPIC ASSISTED REPAIR OF UMBILICAL HERINA WITH MESH;  Surgeon: Luretha Murphy, MD;  Location: [facility];  Service: General;  Laterality: N/A;   VASCULAR SURGERY  2015   right leg     Mardella Layman, MD 12/03/23 1417

## 2023-12-04 DIAGNOSIS — Z6839 Body mass index (BMI) 39.0-39.9, adult: Secondary | ICD-10-CM | POA: Diagnosis not present

## 2023-12-04 DIAGNOSIS — S81801A Unspecified open wound, right lower leg, initial encounter: Secondary | ICD-10-CM | POA: Diagnosis not present

## 2023-12-07 DIAGNOSIS — I890 Lymphedema, not elsewhere classified: Secondary | ICD-10-CM | POA: Diagnosis not present

## 2023-12-10 DIAGNOSIS — F411 Generalized anxiety disorder: Secondary | ICD-10-CM | POA: Diagnosis not present

## 2023-12-10 DIAGNOSIS — I10 Essential (primary) hypertension: Secondary | ICD-10-CM | POA: Diagnosis not present

## 2023-12-10 DIAGNOSIS — R7303 Prediabetes: Secondary | ICD-10-CM | POA: Diagnosis not present

## 2023-12-10 DIAGNOSIS — D638 Anemia in other chronic diseases classified elsewhere: Secondary | ICD-10-CM | POA: Diagnosis not present

## 2023-12-10 DIAGNOSIS — S20229D Contusion of unspecified back wall of thorax, subsequent encounter: Secondary | ICD-10-CM | POA: Diagnosis not present

## 2023-12-10 DIAGNOSIS — S81801A Unspecified open wound, right lower leg, initial encounter: Secondary | ICD-10-CM | POA: Diagnosis not present

## 2023-12-10 DIAGNOSIS — G479 Sleep disorder, unspecified: Secondary | ICD-10-CM | POA: Diagnosis not present

## 2023-12-10 DIAGNOSIS — I872 Venous insufficiency (chronic) (peripheral): Secondary | ICD-10-CM | POA: Diagnosis not present

## 2023-12-10 DIAGNOSIS — F331 Major depressive disorder, recurrent, moderate: Secondary | ICD-10-CM | POA: Diagnosis not present

## 2023-12-10 DIAGNOSIS — G4733 Obstructive sleep apnea (adult) (pediatric): Secondary | ICD-10-CM | POA: Diagnosis not present

## 2023-12-10 DIAGNOSIS — M48062 Spinal stenosis, lumbar region with neurogenic claudication: Secondary | ICD-10-CM | POA: Diagnosis not present

## 2023-12-18 ENCOUNTER — Telehealth: Payer: Self-pay | Admitting: *Deleted

## 2023-12-18 ENCOUNTER — Ambulatory Visit: Payer: Medicare Other | Attending: Cardiology

## 2023-12-18 DIAGNOSIS — Z01810 Encounter for preprocedural cardiovascular examination: Secondary | ICD-10-CM

## 2023-12-18 NOTE — Progress Notes (Signed)
 Virtual Visit via Telephone Note   Because of [name]'s co-morbid illnesses, he is at least at moderate risk for complications without adequate follow up.  This format is felt to be most appropriate for this patient at this time.  The patient did not have access to video technology/had technical difficulties with video requiring transitioning to audio format only (telephone).  All issues noted in this document were discussed and addressed.  No physical exam could be performed with this format.  Please refer to the patient's chart for his consent to telehealth for [facility].  Evaluation Performed:  Preoperative cardiovascular risk assessment _____________   Date:  12/18/2023   Patient ID:  [name], DOB 01/17/1941, MRN [id number] Patient Location:  Home Provider location:   Office  Primary Care Provider:  Aisha Harvey, MD Primary Cardiologist:  Dorn Lesches, MD  Chief Complaint / Patient Profile  83 y.o. y/o male with a h/o hyperlipidemia, chronic LEE, OSA on CPAP, hypertension, hyperlipidemia, chronic dyspnea on exertion felt to be multifactorial from obesity, diastolic dysfunction and reactive irritable bowel disease.  Who is pending urethroscopy and laser of kidney stone and presents today for telephonic preoperative cardiovascular risk assessment. History of Present Illness   [name] is a 83 y.o. male who presents via audio/video conferencing for a telehealth visit today.  Pt was last seen in cardiology clinic on 08/25/23 by Olivia Pavy, PA.  At that time [name] noted increased fatigue and dizziness as well as weakness.  He also reported increased indigestion.  Patient had nuclear stress test that was normal and low risk on 09/03/23.  Patient wore a cardiac monitor for 14 days that showed an average heart rate of 68 bpm with predominant underlying rhythm sinus rhythm, monitor overall stable.  It was felt that his symptoms were GI in nature.  The patient is now pending procedure as outlined above. Since his last visit, he has remained stable from a cardiac standpoint.  He reports that after starting on a potassium supplement his symptoms resolved.  He notes that his chronic dyspnea is at baseline as well as his lower extremity edema.  He denies any chest pain, fatigue, palpitations, melena, hemoptysis, diaphoresis, weakness, presyncope, syncope, orthopnea or PND. 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   L4, L5 ,S1   Depression    takes Wellbutrin  daily   Dyspnea on exertion    with exertion   GERD (gastroesophageal reflux disease)    takes Nexium  and Omeprazole daily   History of kidney stones    HTN (hypertension)    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   Hyperlipidemia    Joint pain    Neuromuscular disorder (HCC)    Pre-diabetes    Prediabetes    Sleep apnea    uses CPAP nightly   SOB (shortness of breath)    Swallowing difficulty    Swelling of lower extremity    right more than leg leg   Umbilical hernia    Past Surgical History:  Procedure Laterality  Date   ABDOMINAL AORTOGRAM W/LOWER EXTREMITY N/A 03/14/2020   Procedure: ABDOMINAL AORTOGRAM W/LOWER EXTREMITY;  Surgeon: Serene Gaile ORN, MD;  Location: [facility];  Service: Vascular;  Laterality: N/A;   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   AMPUTATION TOE Right 04/18/2022   Procedure: AMPUTATION RIGHT GREAT TOE;  Surgeon: Yvone Rush, MD;  Location: [facility];  Service: Orthopedics;  Laterality: Right;   BACK SURGERY  2003   L4, L5   BACK SURGERY     CATARACT EXTRACTION, BILATERAL     ENDOVENOUS ABLATION SAPHENOUS  VEIN W/ LASER Right 06-01-2014   EVLA right greater saphenous vein by Krystal Doing MD    epidural steroid injections        [facility]   hernia repair  2003   HERNIA REPAIR     hernia inguinal x 2   TOTAL HIP ARTHROPLASTY Left 05/05/2022   Procedure: LEFT TOTAL HIP ARTHROPLASTY ANTERIOR APPROACH;  Surgeon: Yvone Rush, MD;  Location: [facility];  Service: Orthopedics;  Laterality: Left;   TOTAL KNEE ARTHROPLASTY Left 03/22/2015   Procedure: TOTAL KNEE ARTHROPLASTY;  Surgeon: Glendia Cordella Hutchinson, MD;  Location: [facility];  Service: Orthopedics;  Laterality: Left;   UMBILICAL HERNIA REPAIR N/A 06/27/2016   Procedure: LAPAROSCOPIC ASSISTED REPAIR OF UMBILICAL HERINA WITH MESH;  Surgeon: Donnice Lunger, MD;  Location: [facility];  Service: General;  Laterality: N/A;   VASCULAR SURGERY  2015   right leg   Allergies No Known Allergies Home Medications    Prior to Admission medications   Medication Sig Start Date End Date Taking? Authorizing Provider  ASTEPRO  205.5 MCG/SPRAY SOLN Place 2 sprays into both nostrils in the morning.    [provider]  buPROPion  (WELLBUTRIN  XL) 150 MG 24 hr tablet Take 450 mg by mouth every morning.    [provider]  doxycycline  (VIBRAMYCIN ) 100 MG capsule Take 1 capsule (100 mg total) by mouth 2 (two) times daily. 12/02/23   Rolinda Rogue, MD  fluticasone  (FLONASE  ALLERGY RELIEF) 50 MCG/ACT nasal spray Place 2 sprays into both nostrils every evening.    [provider]  furosemide  (LASIX ) 40 MG tablet TAKE 1 TABLET BY MOUTH EVERY MORNING AND AN ADDITIONAL TABLET DAILY AS NEEDED FOR UNRESOLVED SWELLING OR FLUID RETENTION 12/01/23   Court Dorn PARAS, MD  gabapentin  (NEURONTIN ) 300 MG capsule TAKE 1 TO 2 CAPSULES BY MOUTH AT BEDTIME Patient taking differently: Take 600 mg by mouth at bedtime. 01/15/17   Gretta Ozell CROME, PA-C  meclizine  (ANTIVERT ) 25 MG tablet Take 1 tablet (25 mg total) by mouth 3 (three) times daily as needed for dizziness.  08/27/23   Raford Lenis, MD  methocarbamol  (ROBAXIN ) 500 MG tablet Take 1 tablet (500 mg total) by mouth every 8 (eight) hours as needed for muscle spasms. Patient taking differently: Take 500 mg by mouth in the morning and at bedtime. 08/08/21   Eudelia Maude SAUNDERS, PA-C  mirtazapine  (REMERON ) 15 MG tablet Take 15 mg by mouth at bedtime.    [provider]  montelukast  (SINGULAIR ) 10 MG tablet Take 1 tablet (10 mg total) by mouth at bedtime. 04/10/23   Rai, Nydia POUR, MD  Multiple Vitamin (MULTIVITAMIN WITH MINERALS) TABS tablet Take 1 tablet by mouth every morning.    [provider]  pantoprazole  (PROTONIX ) 40 MG tablet Take 1 tablet (40 mg total) by mouth 2 (two) times daily before a meal. 04/10/23   Rai,  Ripudeep K, MD  potassium chloride  SA (KLOR-CON  M) 20 MEQ tablet Take 2 tablets (40 mEq total) by mouth 2 (two) times daily. 08/27/23   Raford Lenis, MD  rosuvastatin  (CRESTOR ) 5 MG tablet Take 5 mg by mouth at bedtime. 12/20/18   [provider]  tamsulosin  (FLOMAX ) 0.4 MG CAPS capsule Take 0.4 mg by mouth daily.    [provider]    Physical Exam   Vital Signs:  [name] does not have vital signs available for review today. Given telephonic nature of communication, physical exam is limited. AAOx3. NAD. Normal affect.  Speech and respirations are unlabored. Accessory Clinical Findings   None Assessment & Plan    1.  Preoperative Cardiovascular Risk Assessment: Urethroscopy and laser of kidney stone [name]'s perioperative risk of a major cardiac event is 0.9% according to the Revised Cardiac Risk Index (RCRI).  Therefore, he is at low risk for perioperative complications.   His functional capacity is fair at 5.47 METs according to the Duke Activity Status Index (DASI). Recommendations: According to ACC/AHA guidelines, no further cardiovascular testing needed.  The patient may proceed to surgery at acceptable risk.    Patient does not need  antibiotics.  The patient was advised that if he develops new symptoms prior to surgery to contact our office to arrange for a follow-up visit, and he verbalized understanding.  A copy of this note will be routed to requesting surgeon.  Time:   Today, I have spent 10 minutes with the patient with telehealth technology discussing medical history, symptoms, and management plan.    Devontaye Ground D Vince Ainsley, NP  12/18/2023, 4:44 PM

## 2023-12-18 NOTE — Telephone Encounter (Signed)
  Patient Consent for Virtual Visit        DONTRELLE MAZON has provided verbal consent on 12/18/2023 for a virtual visit (video or telephone).   CONSENT FOR VIRTUAL VISIT FOR:  Charlie LELON Apt  By participating in this virtual visit I agree to the following:  I hereby voluntarily request, consent and authorize [facility] and its employed or contracted physicians, physician assistants, nurse practitioners or other licensed health care professionals (the Practitioner), to provide me with telemedicine health care services (the "Services) as deemed necessary by the treating Practitioner. I acknowledge and consent to receive the Services by the Practitioner via telemedicine. I understand that the telemedicine visit will involve communicating with the Practitioner through live audiovisual communication technology and the disclosure of certain medical information by electronic transmission. I acknowledge that I have been given the opportunity to request an in-person assessment or other available alternative prior to the telemedicine visit and am voluntarily participating in the telemedicine visit.  I understand that I have the right to withhold or withdraw my consent to the use of telemedicine in the course of my care at any time, without affecting my right to future care or treatment, and that the Practitioner or I may terminate the telemedicine visit at any time. I understand that I have the right to inspect all information obtained and/or recorded in the course of the telemedicine visit and may receive copies of available information for a reasonable fee.  I understand that some of the potential risks of receiving the Services via telemedicine include:  Delay or interruption in medical evaluation due to technological equipment failure or disruption; Information transmitted may not be sufficient (e.g. poor resolution of images) to allow for appropriate medical decision making by the  Practitioner; and/or  In rare instances, security protocols could fail, causing a breach of personal health information.  Furthermore, I acknowledge that it is my responsibility to provide information about my medical history, conditions and care that is complete and accurate to the best of my ability. I acknowledge that Practitioner's advice, recommendations, and/or decision may be based on factors not within their control, such as incomplete or inaccurate data provided by me or distortions of diagnostic images or specimens that may result from electronic transmissions. I understand that the practice of medicine is not an exact science and that Practitioner makes no warranties or guarantees regarding treatment outcomes. I acknowledge that a copy of this consent can be made available to me via my patient portal [facility] MyChart), or I can request a printed copy by calling the office of [facility].    I understand that my insurance will be billed for this visit.   I have read or had this consent read to me. I understand the contents of this consent, which adequately explains the benefits and risks of the Services being provided via telemedicine.  I have been provided ample opportunity to ask questions regarding this consent and the Services and have had my questions answered to my satisfaction. I give my informed consent for the services to be provided through the use of telemedicine in my medical care

## 2023-12-22 ENCOUNTER — Encounter (HOSPITAL_COMMUNITY)
Admission: RE | Admit: 2023-12-22 | Discharge: 2023-12-22 | Disposition: A | Discharge status: Home / Self Care | Payer: Medicare Other | Source: Ambulatory Visit | Attending: Urology | Admitting: Urology

## 2023-12-22 DIAGNOSIS — R030 Elevated blood-pressure reading, without diagnosis of hypertension: Secondary | ICD-10-CM | POA: Diagnosis not present

## 2023-12-22 DIAGNOSIS — Z01812 Encounter for preprocedural laboratory examination: Secondary | ICD-10-CM | POA: Diagnosis not present

## 2023-12-22 DIAGNOSIS — N201 Calculus of ureter: Secondary | ICD-10-CM | POA: Diagnosis not present

## 2023-12-22 DIAGNOSIS — N130 Hydronephrosis with ureteropelvic junction obstruction: Secondary | ICD-10-CM | POA: Diagnosis not present

## 2023-12-22 LAB — BASIC METABOLIC PANEL
Anion gap: 12 (ref 5–15)
BUN: 20 mg/dL (ref 8–23)
CO2: 27 mmol/L (ref 22–32)
Calcium: 9.3 mg/dL (ref 8.9–10.3)
Chloride: 102 mmol/L (ref 98–111)
Creatinine, Ser: 1.75 mg/dL — ABNORMAL HIGH (ref 0.61–1.24)
GFR, Estimated: 38 mL/min — ABNORMAL LOW (ref >60)
Glucose, Bld: 108 mg/dL — ABNORMAL HIGH (ref 70–99)
Potassium: 4.4 mmol/L (ref 3.5–5.1)
Sodium: 141 mmol/L (ref 135–145)

## 2023-12-22 LAB — CBC
HCT: 38.7 % — ABNORMAL LOW (ref 39.0–52.0)
Hemoglobin: 13.2 g/dL (ref 13.0–17.0)
MCH: 34.2 pg — ABNORMAL HIGH (ref 26.0–34.0)
MCHC: 34.1 g/dL (ref 30.0–36.0)
MCV: 100.3 fL — ABNORMAL HIGH (ref 80.0–100.0)
Platelets: 202 10*3/uL (ref 150–400)
RBC: 3.86 MIL/uL — ABNORMAL LOW (ref 4.22–5.81)
RDW: 12.2 % (ref 11.5–15.5)
WBC: 6.1 10*3/uL (ref 4.0–10.5)
nRBC: 0 % (ref 0.0–0.2)

## 2023-12-23 ENCOUNTER — Encounter (HOSPITAL_COMMUNITY): Payer: Self-pay

## 2023-12-23 ENCOUNTER — Encounter (HOSPITAL_COMMUNITY)
Admission: RE | Admit: 2023-12-23 | Discharge: 2023-12-23 | Disposition: A | Discharge status: Home / Self Care | Payer: Medicare Other | Source: Ambulatory Visit | Attending: Urology | Admitting: Urology

## 2023-12-23 ENCOUNTER — Other Ambulatory Visit: Payer: Self-pay

## 2023-12-23 VITALS — BP 136/87 | Temp 98.1°F | Resp 20

## 2023-12-23 DIAGNOSIS — G4730 Sleep apnea, unspecified: Secondary | ICD-10-CM | POA: Insufficient documentation

## 2023-12-23 DIAGNOSIS — R030 Elevated blood-pressure reading, without diagnosis of hypertension: Secondary | ICD-10-CM | POA: Insufficient documentation

## 2023-12-23 DIAGNOSIS — Z01810 Encounter for preprocedural cardiovascular examination: Secondary | ICD-10-CM | POA: Insufficient documentation

## 2023-12-23 DIAGNOSIS — N183 Chronic kidney disease, stage 3 unspecified: Secondary | ICD-10-CM | POA: Insufficient documentation

## 2023-12-23 DIAGNOSIS — N200 Calculus of kidney: Secondary | ICD-10-CM | POA: Insufficient documentation

## 2023-12-23 DIAGNOSIS — I129 Hypertensive chronic kidney disease with stage 1 through stage 4 chronic kidney disease, or unspecified chronic kidney disease: Secondary | ICD-10-CM | POA: Insufficient documentation

## 2023-12-23 NOTE — Progress Notes (Addendum)
For Anesthesia: PCP - Gweneth Dimitri, MD  Cardiologist - Runell Gess, MD  Clearance: Rip Harbour: NP: 12/18/23 Bowel Prep reminder:  Chest x-ray - 08/26/23 EKG - 08/27/23 Stress Test -  ECHO - 01/12/23 Cardiac Cath -  Pacemaker/ICD device last checked: Pacemaker orders received: Device Rep notified:  Spinal Cord Stimulator:  Sleep Study - Yes CPAP - Yes  Fasting Blood Sugar -  Checks Blood Sugar _____ times a day Date and result of last Hgb A1c-  Last dose of GLP1 agonist-  GLP1 instructions:   Last dose of SGLT-2 inhibitors-  SGLT-2 instructions:   Blood Thinner Instructions: Aspirin Instructions: Last Dose:  Activity level: Can go up a flight of stairs and activities of daily living without stopping and without chest pain and/or shortness of breath   Unble to exercise without shortness of breath  Anesthesia review: Hx: CKD 3,HTN,Pre-DIA,OSA(CPAP).  Patient denies shortness of breath, fever, cough and chest pain at PAT appointment   Patient verbalized understanding of instructions that were given to them at the PAT appointment. Patient was also instructed that they will need to review over the PAT instructions again at home before surgery.

## 2023-12-23 NOTE — Progress Notes (Signed)
Lab. Results: Creatinine: 1.75

## 2023-12-24 NOTE — Anesthesia Preprocedure Evaluation (Addendum)
Anesthesia Evaluation  Patient identified by MRN, date of birth, ID band Patient awake    Reviewed: Allergy & Precautions, H&P , NPO status , Patient's Chart, lab work & pertinent test results  Airway Mallampati: III  TM Distance: >3 FB Neck ROM: Full    Dental no notable dental hx. (+) Teeth Intact, Dental Advisory Given   Pulmonary shortness of breath, asthma , sleep apnea (severe) and Continuous Positive Airway Pressure Ventilation    Pulmonary exam normal breath sounds clear to auscultation       Cardiovascular Exercise Tolerance: Poor hypertension, Pt. on medications +CHF (diastolic heart failure; on lasix)   Rhythm:Regular Rate:Normal     Neuro/Psych  PSYCHIATRIC DISORDERS Anxiety Depression     Neuromuscular disease (lumbar radiculopathy)    GI/Hepatic Neg liver ROS,GERD  Medicated and Controlled,,  Endo/Other  prediabetes  Renal/GU Renal InsufficiencyRenal disease  negative genitourinary   Musculoskeletal  (+) Arthritis , Osteoarthritis,    Abdominal   Peds negative pediatric ROS (+)  Hematology hyperlipidemia   Anesthesia Other Findings Recent osteomyelitis with right great toe amputation; PICC on IV antibiotics  Reproductive/Obstetrics negative OB ROS                             Anesthesia Physical Anesthesia Plan  ASA: 3  Anesthesia Plan: General   Post-op Pain Management:    Induction: Intravenous  PONV Risk Score and Plan: 2 and Treatment may vary due to age or medical condition, Ondansetron and Midazolam  Airway Management Planned: LMA  Additional Equipment: None  Intra-op Plan:   Post-operative Plan: Extubation in OR  Informed Consent: I have reviewed the patients History and Physical, chart, labs and discussed the procedure including the risks, benefits and alternatives for the proposed anesthesia with the patient or authorized representative who has indicated  his/her understanding and acceptance.     Dental advisory given  Plan Discussed with: Anesthesiologist, CRNA and Surgeon  Anesthesia Plan Comments: (See PAT note 12/23/2023)        Anesthesia Quick Evaluation

## 2023-12-24 NOTE — Progress Notes (Addendum)
Anesthesia Chart Review   Case: [id number] Date/Time: 12/29/23 0945   Procedure: CYSTOSCOPY/ LEFT URETEROSCOPY/HOLMIUM LASER/STENT PLACEMENT (Left) - 60 MINUTE CASE   Anesthesia type: General   Pre-op diagnosis: Left Distal and Renal Stones   Location: [facility] / [facility]   Surgeons: Bjorn Pippin, MD       DISCUSSION:83 y.o. never smoker with h/o HTN, sleep apnea w/CPAP, CKD Stage III, BPH, renal stones scheduled for above procedure 12/29/2023 with Dr. Bjorn Pippin.   Patient had nuclear stress test that was normal and low risk on 09/03/23.  Patient wore a cardiac monitor for 14 days that showed an average heart rate of 68 bpm with predominant underlying rhythm sinus rhythm, monitor overall stable.  It was felt that his symptoms were GI in nature.   Per cardiology preoperative evaluation 12/18/2023, "Preoperative Cardiovascular Risk Assessment: Urethroscopy and laser of kidney stone Mr. Loyer's perioperative risk of a major cardiac event is 0.9% according to the Revised Cardiac Risk Index (RCRI).  Therefore, he is at low risk for perioperative complications.   His functional capacity is fair at 5.47 METs according to the [employer] Activity Status Index (DASI). Recommendations: According to ACC/AHA guidelines, no further cardiovascular testing needed.  The patient may proceed to surgery at acceptable risk.  "  Per previous anesthesia note 05/05/2022, "Difficulty Due To: Difficulty was anticipated, Difficult Airway- due to reduced neck mobility, Difficult Airway- due to limited oral opening and Difficult Airway- due to anterior larynx Future Recommendations: Recommend- induction with short-acting agent, and alternative techniques readily available Comments: CONSIDER DIFFICULT INTUBATION-- IV induction Bass-- intubation AM CRNA atraumatic-- upper teeth with chipping- all teeth unchanged with laryngoscopy -- bilat BS Bass"  VS: BP 136/87   Temp 36.7 C (Oral)   Resp 20   SpO2 97%    PROVIDERS: Gweneth Dimitri, MD is PCP   Primary Cardiologist:  Nanetta Batty, MD  LABS: Labs reviewed: Acceptable for surgery. (all labs ordered are listed, but only abnormal results are displayed)  Labs Reviewed - No data to display   IMAGES:   EKG:   CV: Myocardial Perfusion 09/03/2023    The study is normal. Findings are consistent with no ischemia and no infarction. The study is low risk.   No ST deviation was noted.   LV perfusion is normal. There is no evidence of ischemia. There is no evidence of infarction.   Left ventricular function is normal. Nuclear stress EF: 64%. The left ventricular ejection fraction is normal (55-65%). End diastolic cavity size is normal. End systolic cavity size is normal.   Prior study available for comparison from 02/19/2017.  Since that time, LVEF has improved.   Patient transiently developed rate controlled atrial fibrillation after infusion of Lexiscan.  Echo 01/12/2023 1. Left ventricular ejection fraction, by estimation, is 60 to 65%. Left  ventricular ejection fraction by 3D volume is 60 %. The left ventricle has  normal function. The left ventricle has no regional wall motion  abnormalities. There is mild left  ventricular hypertrophy. Left ventricular diastolic parameters are  indeterminate.   2. Right ventricular systolic function is normal. The right ventricular  size is normal. There is normal pulmonary artery systolic pressure. The  estimated right ventricular systolic pressure is 30.1 mmHg.   3. The mitral valve is normal in structure. Trivial mitral valve  regurgitation.   4. The aortic valve is tricuspid. Aortic valve regurgitation is mild. No  aortic stenosis is present.   5. Aortic dilatation noted. There  is mild dilatation of the ascending  aorta, measuring 38 mm.   6. The inferior vena cava is dilated in size with >50% respiratory  variability, suggesting right atrial pressure of 8 mmHg.  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   Hyperlipidemia    Joint pain    Neuromuscular disorder (HCC)    Pre-diabetes    Prediabetes    Sleep apnea    uses CPAP nightly   SOB (shortness of breath)    Swallowing difficulty    Swelling of lower extremity    right more than leg leg   Umbilical hernia     Past Surgical History:  Procedure Laterality Date   ABDOMINAL AORTOGRAM W/LOWER EXTREMITY N/A 03/14/2020   Procedure: ABDOMINAL AORTOGRAM W/LOWER EXTREMITY;  Surgeon: Nada Libman, MD;  Location: [facility];  Service: Vascular;  Laterality: N/A;   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   AMPUTATION TOE Right 04/18/2022   Procedure: AMPUTATION RIGHT GREAT TOE;  Surgeon: Jodi Geralds, MD;  Location: [facility];  Service: Orthopedics;  Laterality: Right;   BACK SURGERY  2003   L4, L5   BACK SURGERY     CATARACT EXTRACTION, BILATERAL     ENDOVENOUS ABLATION SAPHENOUS VEIN W/ LASER Right 06-01-2014   EVLA right greater saphenous vein by Gretta Began MD    epidural steroid injections        [facility] dr [place]   hernia repair  2003   HERNIA REPAIR     hernia inguinal x 2   TOTAL HIP ARTHROPLASTY Left 05/05/2022   Procedure: LEFT TOTAL HIP ARTHROPLASTY ANTERIOR APPROACH;  Surgeon: Jodi Geralds, MD;  Location: [facility];  Service: Orthopedics;  Laterality: Left;   TOTAL KNEE ARTHROPLASTY Left 03/22/2015   Procedure: TOTAL KNEE  ARTHROPLASTY;  Surgeon: Cammy Copa, MD;  Location: [facility];  Service: Orthopedics;  Laterality: Left;   UMBILICAL HERNIA REPAIR N/A 06/27/2016   Procedure: LAPAROSCOPIC ASSISTED REPAIR OF UMBILICAL HERINA WITH MESH;  Surgeon: Luretha Murphy, MD;  Location: [facility];  Service: General;  Laterality: N/A;   VASCULAR SURGERY  2015   right leg    MEDICATIONS:  ASTEPRO 205.5 MCG/SPRAY SOLN   buPROPion (WELLBUTRIN XL) 150 MG 24 hr tablet   doxycycline (VIBRAMYCIN) 100 MG capsule   fluticasone (FLONASE ALLERGY RELIEF) 50 MCG/ACT nasal spray   furosemide (LASIX) 40 MG tablet   gabapentin (NEURONTIN) 300 MG capsule   meclizine (ANTIVERT) 25 MG tablet   methocarbamol (ROBAXIN) 500 MG tablet   mirtazapine (REMERON) 15 MG tablet   montelukast (SINGULAIR) 10 MG tablet   Multiple Vitamin (MULTIVITAMIN WITH MINERALS) TABS tablet   pantoprazole (PROTONIX) 40 MG tablet   potassium chloride SA (  KLOR-CON M) 20 MEQ tablet   rosuvastatin (CRESTOR) 5 MG tablet   tamsulosin (FLOMAX) 0.4 MG CAPS capsule   No current facility-administered medications for this encounter.    Jodell Cipro Ward, PA-C [facility] [phone]

## 2023-12-28 ENCOUNTER — Ambulatory Visit
Admission: EM | Admit: 2023-12-28 | Discharge: 2023-12-28 | Disposition: A | Discharge status: Home / Self Care | Payer: Medicare Other | Attending: Family Medicine | Admitting: Family Medicine

## 2023-12-28 DIAGNOSIS — L0231 Cutaneous abscess of buttock: Secondary | ICD-10-CM | POA: Diagnosis not present

## 2023-12-28 MED ORDER — MUPIROCIN 2 % EX OINT: 1.0000 | TOPICAL_OINTMENT | Freq: Two times a day (BID) | CUTANEOUS | 0 refills | Status: DC

## 2023-12-28 MED ORDER — DOXYCYCLINE HYCLATE 100 MG PO CAPS: 100.0000 mg | ORAL_CAPSULE | Freq: Two times a day (BID) | ORAL | 0 refills | Status: DC

## 2023-12-28 NOTE — H&P (Signed)
Pt presents today for pre-operative history and physical exam in anticipation of cysto, left ureteroscopy, laser litho, and left ureteral stent placement by Dr. Annabell Howells on 12/29/23.   Cardiac clearance obtained to proceed with surgery.   Pt denies F/C, HA, CP, SOB, N/V, diarrhea/constipation, back pain, flank pain, hematuria, and dysuria.    HX:  [name] returns today in f/u for his history of hypogonadism. He is off of the testoterone because of cost and his level is <10. His Hgb is 12.7. He has been hospitalized with RSV and the flu as well as GI issues. He has progressive CKD. He has progressive LUTS with urgency with UUI and nocturia x 3-4. His IPSS is 18. He has changed from doxazosin to tamsulosin. His UA has 3-10 RBC's. He has bilateral renal stones without obstruction on CT in 5/24. His Cr was up to 2.32 in October and he had a renal US that showed mild left hydro and bilateral renal stones. He has had some swelling of the foreskin that was intermittent. He has peripheral edema and has both feet wrapped. CT stone study today shows a small left distal stone without obstruction and bilateral renal stones. The ureteral stone is not easily seen on the scout view.     ALLERGIES: No Allergies    MEDICATIONS: Nexium 1 PO Daily  Tamsulosin Hcl 0.4 mg capsule  Albuterol Sulfate  Antibiotic  BuPROPion HCl ER (XL) 300 MG Oral Tablet Extended Release 24 Hour Oral  Calcium 600 MG Oral Tablet Oral  Flonase Allergy Relief  Furosemide 40 mg tablet Oral  Gabapentin 1 PO Daily  Glucosamine Chondroitin TABS Oral  Hydrocodone-Acetaminophen 5 mg-325 mg tablet Oral  Methocarbamol 1 PO Daily  Mirtazapine  Multi-Day Vitamins TABS Oral  Potassium Chloride 20 meq tablet, ext release, particles/crystals Oral  Rosuvastatin Calcium  Testosterone Cypionate 200 mg/ml vial 0.75 ml IM Q1WK     GU PSH: Complex Uroflow - 2019, 2017       PSH Notes: Endovenous Ablation Of Incompetent Vein Laser, Back Surgery,  Coronary Angiography, W/O Concomitant Left Heart Catheteriz, Back Surgery, Hernia Repair.   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 The increase could have been associated with the stone. He has no hydro today. - 10/19/2023 Hydronephrosis, The hydro seen in October has resolved. - 10/19/2023 Primary hypogonadism, His T is castrate off of therapy but he didn't express interest in resuming. - 10/19/2023, Labs today and med refilled. F/u in 6 months with labs. , - 07/04/2022, - 2023, - 2022 (Stable), I will check labs today and have him return in 6 months with labs. , - 2020 (Stable), He is doing well on TRT. I have refilled the testostosterone. , - 2019 (Stable), His testosterone level is therapeutic so I can attribute his fatigue to low T at this time. I have suggested that he have his PCP check his thyroid function. His TSH appears to be rising slowly on historical labs in EPIC. , - 2018 (Worsening), I am going to increase him to 150mg  IM q2wks and will have him return in 6 months with a testosterone and H&H., - 2018, - 2017, Hypogonadism, testicular, - 2015 Renal calculus - 10/19/2023, He had bilateral non-obstructing stones on a CT last fall. His UA is clear  and he has no stone symptoms. , - 07/04/2022, 27-Jan-2016, Nephrolithiasis, - 2015/01/26 Ureteral calculus (Stable), He has a 6mm left distal stone. I discussed options. He will stay on tamsulosin. I will get him set up for URS in early January. I have reviewed the risks of ureteroscopy including bleeding, infection, ureteral injury, need for a stent or secondary procedures, thrombotic events and anesthetic complications. - 10/19/2023, Calculus of right ureter, - 01-26-15 Urge incontinence, He has increased urgency which could be from the distal stone  on CT. - 10/19/2023, - 11/06/2022 BPH w/LUTS - 11/06/2022, He will continue the doxazosin. , - 07/04/2022, 01/26/21, Benign prostatic hyperplasia with urinary obstruction, - January 26, 2014 Incomplete bladder emptying - 11/06/2022 Incontinence w/o Sensation - 11/06/2022 Nocturia - 11/06/2022, Nocturia, - 2015 Urinary Frequency - 11/06/2022, (Stable), - January 27, 2016, Urinary frequency, - 2015 Urinary Urgency - 11/06/2022, - 07/04/2022, January 26, 2022, 01/26/21 (Worsening), - January 26, 2019, He has a reduced stream but is emptying well. I discussed options for managing his OAB symptoms including reducing dietary irritants, Myrbetriq or an outlet procedure to relieve obstruction. He will try to reduce the bladder irritants. He will f/u in 4 months with labs for f/u of his testosterone replacement therapy. , 2018-01-26 (Worsening), He has bothersome urgency on tamsulosin. I am going to have him return for a flowrate, PVR and possible cystoscopy. , January 26, 2018, 2017/01/26 (Worsening), I discussed options and will give him oxybutynin 5mg  po qam to help with the am urgency. , Jan 26, 2017, 2016-01-27 Balanitis - 04/11/2022 Paraphimosis - 01-26-2019 Oth urogenital candidiasis, He has clotrimazole and I recommended he get it filled. 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 Chronic kidney disease stage 3 (GFR 30-60), CKD (chronic kidney disease), stage III - 26-Jan-2014 Other microscopic hematuria, Microscopic hematuria - 26-Jan-2014 ED due to arterial insufficiency, Erectile dysfunction due to arterial insufficiency - 26-Jan-2013 Elevated PSA, Elevated prostate specific antigen (PSA) - 2013/01/26      PMH Notes:  2007-10-20 15:59:46 - Note: Arthritis   NON-GU PMH: Encounter for general adult medical examination without abnormal findings, Encounter for preventive health examination - 01/26/2014 Personal history of other diseases of the circulatory system, History of varicose veins - 2015 Anxiety, Anxiety - 2014 Edema, unspecified, Edema -  Jan 26, 2013 Encounter for screening for infections with a predominantly sexual mode of transmission, Visit For: Screening Exam Venereal Disease - 26-Jan-2013 Personal history of other diseases of the digestive system, History of esophageal reflux - 01/26/13 Personal history of other mental and behavioral disorders, History of depression - 01/26/13    FAMILY HISTORY: Death In The Family Father - Runs In Family Death In The Family Mother - Runs In Family Family Health Status Number - Runs In Family Heart Disease - Father Thyroid Disorder - Mother Transient Ischemic Attack - Mother   SOCIAL HISTORY: Marital Status: Single Preferred Language: English; Ethnicity: Not Hispanic Or Latino; Race: White Current Smoking Status: Patient has never smoked.  Drinks 1 caffeinated drink per day. 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     Notes: Never A Smoker, Alcohol Use, Marital History - Single, Occupation:, Caffeine Use   REVIEW OF SYSTEMS:    GU Review Male:   Patient denies frequent urination, hard to postpone urination, burning/ pain with urination, get up at night to urinate, leakage of urine, stream starts and stops, trouble starting your stream, have to strain to urinate , erection problems, and penile pain.  Gastrointestinal (Upper):   Patient denies  nausea, vomiting, and indigestion/ heartburn.  Gastrointestinal (Lower):   Patient denies diarrhea and constipation.  Constitutional:   Patient denies fever, night sweats, weight loss, and fatigue.  Skin:   Patient denies skin rash/ lesion and itching.  Eyes:   Patient denies blurred vision and double vision.  Ears/ Nose/ Throat:   Patient denies sore throat and sinus problems.  Hematologic/Lymphatic:   Patient denies swollen glands and easy bruising.  Cardiovascular:   Patient denies leg swelling and chest pains.  Respiratory:   Patient denies cough and shortness of breath.  Endocrine:   Patient denies excessive thirst.  Musculoskeletal:   Patient denies back  pain and joint pain.  Neurological:   Patient denies headaches and dizziness.  Psychologic:   Patient denies depression and anxiety.   VITAL SIGNS:      12/22/2023 03:37 PM  Weight 240 lb / 108.86 kg  Height 69 in / 175.26 cm  BP 122/66 mmHg  Pulse 84 /min  Temperature 98.1 F / 36.7 C  BMI 35.4 kg/m   Complexity of Data:  Records Review:   Previous Patient Records  Urine Test Review:   Urinalysis   PROCEDURES:         KUB - 16109  A single view of the abdomen is obtained.      Patient confirmed No Neulasta OnPro Device.     ASSESSMENT:      ICD-10 Details  1 GU:   Hydronephrosis - N13.0   2   Ureteral calculus - N20.1      PLAN:           Schedule Return Visit/Planned Activity: Keep Scheduled Appointment - Schedule Surgery          Document Letter(s):  Created for Patient: Clinical Summary         Notes:   There are no changes in the patients history or physical exam since last evaluation by Dr. Annabell Howells. Pt is scheduled to undergo cysto, left ureteroscopy, laser litho, and stent placement on 12/29/23.   All pt's questions were answered to the best of my ability.          Next Appointment:      Next Appointment: 12/29/2023 10:00 AM    Appointment Type: Surgery     Location: [facility] [phone]    Provider: Bjorn Hanson, M.D.    Reason for Visit: [facility]--CY, Left URS, HLL \\T \ Stent

## 2023-12-28 NOTE — ED Provider Notes (Addendum)
[facility]    CSN: [id number] Arrival date & time: 12/28/23  1812      History   Chief Complaint No chief complaint on file.   HPI [name] is a 83 y.o. male presents for possible abscess.  Patient reports 1 week of a firm area to his buttock.  He has been applying jock itch cream without improvement.  States it is uncomfortable if he sits certain ways or presses on it.  No fevers, chills, drainage.  No history of MRSA.  Denies any history of abscess as well.  No other concerns at this time.  HPI  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   Hyperlipidemia    Joint pain    Neuromuscular disorder (HCC)    Pre-diabetes    Prediabetes    Sleep apnea    uses CPAP nightly   SOB (shortness of breath)    Swallowing difficulty    Swelling of lower extremity    right more than leg leg   Umbilical hernia     Patient Active Problem List   Diagnosis Date Noted   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   Aspiration pneumonia (HCC) 04/07/2023   Asthma 04/07/2023   Acute respiratory failure with hypoxia (HCC) 11/18/2022   OSA (obstructive sleep apnea) 11/18/2022   Pulmonary nodules 11/18/2022   Blood in stool 11/18/2022   Influenza A with pneumonia 11/13/2022   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/2022   Primary osteoarthritis of left hip 04/23/2022   Osteomyelitis of great toe of right foot (HCC) 04/18/2022   Left  hip pain 04/09/2022   Upper airway cough syndrome 02/03/2022   Acute rhinosinusitis 01/24/2022   Hypertension 09/30/2021   Allergic rhinitis due to pollen 03/25/2021   Benign prostatic hyperplasia with lower urinary tract symptoms 03/25/2021   Chronic fatigue syndrome 03/25/2021   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   Stage 3b chronic kidney disease (CKD) (HCC) 03/25/2021   Chronic pain 03/25/2021   Dyslipidemia 03/25/2021   Edema 03/25/2021   Generalized anxiety disorder 03/25/2021   Mild persistent asthma 03/25/2021   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   Obesity (BMI 30-39.9) 03/25/2021   Non-pressure chronic ulcer of other part of right foot limited to breakdown of skin (HCC) 03/25/2021   Prediabetes 03/25/2021   Primary insomnia 03/25/2021   Sleep disturbance 03/25/2021   Paresthesia of skin 06/08/2020   Right foot drop 06/08/2020   Body mass index (BMI) 35.0-35.9, adult 03/13/2020   Spondylosis of cervical region without myelopathy or radiculopathy 01/05/2020   Elevated blood-pressure reading, without diagnosis of hypertension 12/07/2019   Spinal stenosis of lumbar region with neurogenic claudication 01/15/2017   Spondylolisthesis of lumbar region 01/15/2017   Claw toe, acquired, left 01/06/2017   Achilles tendon contracture, bilateral  01/06/2017   Pain in metatarsus of both feet 01/06/2017   Tubular adenoma of colon 12/29/2016   Basal cell carcinoma 08/19/2016   Umbilical hernia s/p lap repair with mesh 06/27/2016 06/27/2016   Degenerative arthritis of knee 03/22/2015   Dyspnea on exertion 01/31/2015   Lumbar radiculopathy 12/12/2014   Facet hypertrophy of lumbar region 12/12/2014   Left knee pain 12/12/2014   Ulcer of lower limb (HCC) 01/10/2014   Varicose veins of bilateral lower extremities with other complications 01/10/2014   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 12/21/2013   Renal insufficiency 12/21/2013   Swelling in head/neck  07/28/2012   Venous (peripheral) insufficiency 05/25/2012   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   Pain in limb 02/12/2012   Hyperlipidemia 02/28/2008   Allergic rhinitis 02/28/2008   Sleep apnea 02/28/2008    Past Surgical History:  Procedure Laterality Date   ABDOMINAL AORTOGRAM W/LOWER EXTREMITY N/A 03/14/2020   Procedure: ABDOMINAL AORTOGRAM W/LOWER EXTREMITY;  Surgeon: Nada Libman, MD;  Location: [facility];  Service: Vascular;  Laterality: N/A;   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   AMPUTATION TOE Right 04/18/2022   Procedure: AMPUTATION RIGHT GREAT TOE;  Surgeon: Jodi Geralds, MD;  Location: [facility];  Service: Orthopedics;  Laterality: Right;   BACK SURGERY  2003   L4, L5   BACK SURGERY     CATARACT EXTRACTION, BILATERAL     ENDOVENOUS ABLATION SAPHENOUS VEIN W/ LASER Right 06-01-2014   EVLA right greater saphenous vein by Gretta Began MD    epidural steroid injections        [facility]   hernia repair  2003   HERNIA REPAIR     hernia inguinal x 2   TOTAL HIP ARTHROPLASTY Left 05/05/2022   Procedure: LEFT TOTAL HIP ARTHROPLASTY ANTERIOR APPROACH;  Surgeon: Jodi Geralds, MD;  Location: [facility];  Service: Orthopedics;  Laterality: Left;   TOTAL KNEE ARTHROPLASTY Left 03/22/2015   Procedure: TOTAL KNEE ARTHROPLASTY;  Surgeon: Cammy Copa, MD;  Location: [facility];  Service: Orthopedics;  Laterality: Left;   UMBILICAL HERNIA REPAIR N/A 06/27/2016   Procedure: LAPAROSCOPIC ASSISTED REPAIR OF UMBILICAL HERINA WITH MESH;  Surgeon: Luretha Murphy, MD;  Location: [facility];  Service: General;  Laterality: N/A;   VASCULAR SURGERY  2015   right leg       Home Medications    Prior to Admission medications   Medication Sig Start Date End Date Taking? Authorizing Provider  doxycycline (VIBRAMYCIN) 100 MG capsule Take 1 capsule (100 mg total) by mouth 2 (two) times daily for 7 days. 12/28/23 01/04/24 Yes Radford Pax, NP  mupirocin ointment  (BACTROBAN) 2 % Apply 1 Application topically 2 (two) times daily for 7 days. 12/28/23 01/04/24 Yes Radford Pax, NP  ASTEPRO 205.5 MCG/SPRAY SOLN Place 2 sprays into both nostrils in the morning.    [provider]  buPROPion (WELLBUTRIN XL) 150 MG 24 hr tablet Take 450 mg by mouth every morning.    [provider]  fluticasone (FLONASE ALLERGY RELIEF) 50 MCG/ACT nasal spray Place 2 sprays into both nostrils every evening.    [provider]  furosemide (LASIX) 40 MG tablet TAKE 1 TABLET BY MOUTH EVERY MORNING AND AN ADDITIONAL TABLET DAILY AS NEEDED FOR UNRESOLVED SWELLING OR FLUID RETENTION 12/01/23   Runell Gess, MD  gabapentin (NEURONTIN) 300 MG capsule TAKE 1 TO 2 CAPSULES BY MOUTH AT BEDTIME Patient  taking differently: Take 600 mg by mouth at bedtime. 01/15/17   Ofilia Neas, PA-C  meclizine (ANTIVERT) 25 MG tablet Take 1 tablet (25 mg total) by mouth 3 (three) times daily as needed for dizziness. 08/27/23   Dione Booze, MD  methocarbamol (ROBAXIN) 500 MG tablet Take 1 tablet (500 mg total) by mouth every 8 (eight) hours as needed for muscle spasms. Patient taking differently: Take 500 mg by mouth in the morning and at bedtime. 08/08/21   Haskel Schroeder, PA-C  mirtazapine (REMERON) 15 MG tablet Take 15 mg by mouth at bedtime.    [provider]  montelukast (SINGULAIR) 10 MG tablet Take 1 tablet (10 mg total) by mouth at bedtime. 04/10/23   Rai, Delene Ruffini, MD  Multiple Vitamin (MULTIVITAMIN WITH MINERALS) TABS tablet Take 1 tablet by mouth every morning.    [provider]  pantoprazole (PROTONIX) 40 MG tablet Take 1 tablet (40 mg total) by mouth 2 (two) times daily before a meal. 04/10/23   Rai, Ripudeep K, MD  potassium chloride SA (KLOR-CON M) 20 MEQ tablet Take 2 tablets (40 mEq total) by mouth 2 (two) times daily. 08/27/23   Dione Booze, MD  rosuvastatin (CRESTOR) 5 MG tablet Take 5 mg by mouth at bedtime. 12/20/18   [provider]  tamsulosin (FLOMAX) 0.4 MG CAPS capsule Take 0.4 mg by mouth daily.    [provider]    Family History Family History  Problem Relation Age of Onset   Thyroid disease Mother    Heart disease Father    Hypertension Father    Diabetes Maternal Grandfather    Colon cancer Neg Hx    Esophageal cancer Neg Hx     Social History Social History   Tobacco Use   Smoking status: Never   Smokeless tobacco: Never  Vaping Use   Vaping status: Never Used  Substance Use Topics   Alcohol use: Yes    Comment: rare   Drug use: No     Allergies   Patient has no known allergies.   Review of Systems Review of Systems  Skin:        Abscess buttock     Physical Exam Triage Vital Signs ED Triage Vitals [12/28/23 1826]  Encounter Vitals Group     BP (!) 140/95     Systolic BP Percentile      Diastolic BP Percentile      Pulse Rate (!) 107     Resp 18     Temp 98.4 F (36.9 C)     Temp Source Oral     SpO2 95 %     Weight      Height      Head Circumference      Peak Flow      Pain Score 0     Pain Loc      Pain Education      Exclude from Growth Chart    No data found.  Updated Vital Signs BP (!) 140/95 (BP Location: Left Arm)   Pulse (!) 107   Temp 98.4 F (36.9 C) (Oral)   Resp 18   SpO2 95%   Visual Acuity Right Eye Distance:   Left Eye Distance:   Bilateral Distance:    Right Eye Near:   Left Eye Near:    Bilateral Near:     Physical Exam Vitals and nursing note reviewed.  Constitutional:      Appearance: Normal appearance.  HENT:     Head: Normocephalic and atraumatic.  Eyes:     Pupils: Pupils are equal, round, and reactive to light.  Cardiovascular:     Rate and Rhythm: Normal rate.  Pulmonary:     Effort: Pulmonary effort is normal.  Skin:    General: Skin is warm and dry.          Comments: Some mild skin breakdown between the buttock with some mild induration to the left upper inner buttock.  No fluctuance,  erythema or drainage.  Area is mildly tender to palpation.  Neurological:     General: No focal deficit present.     Mental Status: He is alert and oriented to person, place, and time.  Psychiatric:        Mood and Affect: Mood normal.        Behavior: Behavior normal.      UC Treatments / Results  Labs (all labs ordered are listed, but only abnormal results are displayed) Labs Reviewed - No data to display   Basic metabolic panel Order: [id number]  Status: Final result     Next appt: 02/02/2024 at 10:30 AM in Gastroenterology Benancio Deeds, MD)   Test Result Released: Yes (not seen)   0 Result Notes          Component Ref Range & Units (hover) 6 d ago (12/22/23) 4 mo ago (08/26/23) 8 mo ago (04/10/23) 8 mo ago (04/09/23) 8 mo ago (04/08/23) 8 mo ago (04/07/23) 10 mo ago (02/23/23)  Sodium 141 139 140 139 138 137 138  Potassium 4.4 2.8 Low  3.3 Low  3.1 Low  4.0 3.2 Low  3.2 Low   Chloride 102 106 99 101 101 99 100  CO2 27 23 29 28 27 29 26   Glucose, Bld 108 High  102 High  CM 103 High  CM 125 High  CM 142 High  CM 112 High  CM 119 High  CM  Comment: Glucose reference range applies only to samples taken after fasting for at least 8 hours.  BUN 20 28 High  25 High  25 High  16 15 12   Creatinine, Ser 1.75 High  2.32 High  2.06 High  2.00 High  1.70 High  1.82 High  1.88 High   Calcium 9.3 8.0 Low  8.7 Low  8.3 Low  8.3 Low  8.5 Low  9.0  GFR, Estimated 38 Low  27 Low  CM 32 Low  CM 33 Low  CM 40 Low  CM 37 Low  CM 35 Low  CM  Comment: (NOTE) Calculated using the CKD-EPI Creatinine Equation (2021)  Anion gap 12 10 CM 12 CM 10 CM 10 CM 9 CM 12 CM  Comment: Performed at [facility], [address] [place], [place] [address]  Resulting Agency [facility]        Specimen Collected: 12/22/23 14:29    EKG   Radiology No results found.  Procedures Procedures (including  critical care time)  Medications Ordered in UC Medications - No data to display  Initial Impression / Assessment and Plan / UC Course  I have reviewed the triage vital signs and the nursing notes.  Pertinent labs & imaging results that were available during my care of the patient were reviewed by me and considered in my medical decision making (see chart for details).  Reviewed exam and symptoms with patient.  Will start doxycycline and mupirocin topically.  Advised warm compresses.  Instructed to follow-up with PCP in 2 to 3 days for recheck.  ER precautions reviewed and patient verbalized understanding Final Clinical Impressions(s) / UC Diagnoses   Final diagnoses:  Abscess of buttock     Discharge Instructions      Start doxycycline twice daily for 7 days.  You may do mupirocin topical antibiotic ointment to the area as well.  Keep the area clean and dry.  Follow-up with your PCP in 2 to 3 days for recheck.  Please go to the ER if you develop any worsening symptoms.  Hope you feel better soon!    ED Prescriptions     Medication Sig Dispense Auth. Provider   mupirocin ointment (BACTROBAN) 2 % Apply 1 Application topically 2 (two) times daily for 7 days. 22 g Radford Pax, NP   doxycycline (VIBRAMYCIN) 100 MG capsule Take 1 capsule (100 mg total) by mouth 2 (two) times daily for 7 days. 14 capsule Radford Pax, NP      PDMP not reviewed this encounter.   Radford Pax, NP 12/28/23 1844    Radford Pax, NP 12/28/23 [phone]

## 2023-12-28 NOTE — Discharge Instructions (Addendum)
Start doxycycline twice daily for 7 days.  You may do mupirocin topical antibiotic ointment to the area as well.  Keep the area clean and dry.  Follow-up with your PCP in 2 to 3 days for recheck.  Please go to the ER if you develop any worsening symptoms.  Hope you feel better soon!

## 2023-12-28 NOTE — ED Triage Notes (Signed)
Patient presents to UC for abscess located on coccyx area x 1 week. States he was applying jock itch cream with no improvement. Pain with movement and with sitting.   Denies drainage.

## 2023-12-29 ENCOUNTER — Other Ambulatory Visit: Payer: Self-pay

## 2023-12-29 ENCOUNTER — Observation Stay (HOSPITAL_COMMUNITY)
Admission: RE | Admit: 2023-12-29 | Discharge: 2023-12-30 | Disposition: A | Discharge status: Home / Self Care | Payer: Medicare Other | Source: Ambulatory Visit | Attending: Urology | Admitting: Urology

## 2023-12-29 ENCOUNTER — Encounter (HOSPITAL_COMMUNITY): Payer: Self-pay | Admitting: Urology

## 2023-12-29 ENCOUNTER — Ambulatory Visit (HOSPITAL_BASED_OUTPATIENT_CLINIC_OR_DEPARTMENT_OTHER): Payer: Medicare Other | Admitting: Certified Registered Nurse Anesthetist

## 2023-12-29 ENCOUNTER — Ambulatory Visit (HOSPITAL_COMMUNITY): Payer: Medicare Other | Admitting: Physician Assistant

## 2023-12-29 ENCOUNTER — Encounter (HOSPITAL_COMMUNITY)
Admission: RE | Disposition: A | Discharge status: Home / Self Care | Payer: Self-pay | Source: Ambulatory Visit | Attending: Urology

## 2023-12-29 ENCOUNTER — Observation Stay (HOSPITAL_COMMUNITY): Payer: Medicare Other

## 2023-12-29 DIAGNOSIS — N184 Chronic kidney disease, stage 4 (severe): Secondary | ICD-10-CM | POA: Insufficient documentation

## 2023-12-29 DIAGNOSIS — N132 Hydronephrosis with renal and ureteral calculous obstruction: Principal | ICD-10-CM | POA: Insufficient documentation

## 2023-12-29 DIAGNOSIS — E785 Hyperlipidemia, unspecified: Secondary | ICD-10-CM | POA: Diagnosis not present

## 2023-12-29 DIAGNOSIS — N200 Calculus of kidney: Secondary | ICD-10-CM | POA: Diagnosis not present

## 2023-12-29 DIAGNOSIS — J4530 Mild persistent asthma, uncomplicated: Secondary | ICD-10-CM

## 2023-12-29 DIAGNOSIS — L0231 Cutaneous abscess of buttock: Secondary | ICD-10-CM

## 2023-12-29 DIAGNOSIS — N1832 Chronic kidney disease, stage 3b: Secondary | ICD-10-CM

## 2023-12-29 DIAGNOSIS — Z79899 Other long term (current) drug therapy: Secondary | ICD-10-CM | POA: Insufficient documentation

## 2023-12-29 DIAGNOSIS — I10 Essential (primary) hypertension: Secondary | ICD-10-CM | POA: Diagnosis not present

## 2023-12-29 DIAGNOSIS — I129 Hypertensive chronic kidney disease with stage 1 through stage 4 chronic kidney disease, or unspecified chronic kidney disease: Secondary | ICD-10-CM | POA: Diagnosis not present

## 2023-12-29 DIAGNOSIS — N202 Calculus of kidney with calculus of ureter: Secondary | ICD-10-CM | POA: Diagnosis present

## 2023-12-29 DIAGNOSIS — J45909 Unspecified asthma, uncomplicated: Secondary | ICD-10-CM | POA: Diagnosis not present

## 2023-12-29 DIAGNOSIS — N201 Calculus of ureter: Principal | ICD-10-CM | POA: Diagnosis present

## 2023-12-29 DIAGNOSIS — R030 Elevated blood-pressure reading, without diagnosis of hypertension: Secondary | ICD-10-CM

## 2023-12-29 HISTORY — PX: CYSTOSCOPY/URETEROSCOPY/HOLMIUM LASER/STENT PLACEMENT: SHX6546

## 2023-12-29 LAB — BASIC METABOLIC PANEL
Anion gap: 13 (ref 5–15)
BUN: 23 mg/dL (ref 8–23)
CO2: 24 mmol/L (ref 22–32)
Calcium: 9.3 mg/dL (ref 8.9–10.3)
Chloride: 101 mmol/L (ref 98–111)
Creatinine, Ser: 1.82 mg/dL — ABNORMAL HIGH (ref 0.61–1.24)
GFR, Estimated: 37 mL/min — ABNORMAL LOW (ref >60)
Glucose, Bld: 163 mg/dL — ABNORMAL HIGH (ref 70–99)
Potassium: 4.4 mmol/L (ref 3.5–5.1)
Sodium: 138 mmol/L (ref 135–145)

## 2023-12-29 LAB — CBC
HCT: 37.6 % — ABNORMAL LOW (ref 39.0–52.0)
Hemoglobin: 12.8 g/dL — ABNORMAL LOW (ref 13.0–17.0)
MCH: 34 pg (ref 26.0–34.0)
MCHC: 34 g/dL (ref 30.0–36.0)
MCV: 99.7 fL (ref 80.0–100.0)
Platelets: 156 10*3/uL (ref 150–400)
RBC: 3.77 MIL/uL — ABNORMAL LOW (ref 4.22–5.81)
RDW: 12.2 % (ref 11.5–15.5)
WBC: 6.1 10*3/uL (ref 4.0–10.5)
nRBC: 0 % (ref 0.0–0.2)

## 2023-12-29 SURGERY — CYSTOSCOPY/URETEROSCOPY/HOLMIUM LASER/STENT PLACEMENT
Anesthesia: General | Site: Ureter | Laterality: Left

## 2023-12-29 MED ORDER — OXYCODONE HCL 5 MG/5ML PO SOLN: 5.0000 mg | Freq: Once | ORAL | Status: DC | PRN

## 2023-12-29 MED ORDER — CHLORHEXIDINE GLUCONATE 0.12 % MT SOLN
15.0000 mL | Freq: Once | OROMUCOSAL | Status: AC
  Administered 2023-12-29: 15 mL via OROMUCOSAL

## 2023-12-29 MED ORDER — IOHEXOL 300 MG/ML  SOLN
INTRAMUSCULAR | Status: DC | PRN
  Administered 2023-12-29: 10 mL via URETHRAL

## 2023-12-29 MED ORDER — FUROSEMIDE 40 MG PO TABS
40.0000 mg | ORAL_TABLET | Freq: Every day | ORAL | Status: DC
  Administered 2023-12-30: 40 mg via ORAL
  Filled 2023-12-29 (×2): qty 1

## 2023-12-29 MED ORDER — BUPROPION HCL ER (XL) 300 MG PO TB24
450.0000 mg | ORAL_TABLET | Freq: Every morning | ORAL | Status: DC
  Administered 2023-12-30: 450 mg via ORAL
  Filled 2023-12-29: qty 1

## 2023-12-29 MED ORDER — ACETAMINOPHEN 325 MG PO TABS: 650.0000 mg | ORAL_TABLET | ORAL | Status: DC | PRN

## 2023-12-29 MED ORDER — PANTOPRAZOLE SODIUM 40 MG PO TBEC
40.0000 mg | DELAYED_RELEASE_TABLET | Freq: Two times a day (BID) | ORAL | Status: DC
  Administered 2023-12-29 – 2023-12-30 (×2): 40 mg via ORAL
  Filled 2023-12-29 (×2): qty 1

## 2023-12-29 MED ORDER — SODIUM CHLORIDE 0.9% FLUSH
3.0000 mL | Freq: Two times a day (BID) | INTRAVENOUS | Status: DC
  Administered 2023-12-29: 3 mL via INTRAVENOUS
  Administered 2023-12-29: 10 mL via INTRAVENOUS

## 2023-12-29 MED ORDER — MONTELUKAST SODIUM 10 MG PO TABS
10.0000 mg | ORAL_TABLET | Freq: Every day | ORAL | Status: DC
  Administered 2023-12-29: 10 mg via ORAL
  Filled 2023-12-29: qty 1

## 2023-12-29 MED ORDER — FENTANYL CITRATE (PF) 100 MCG/2ML IJ SOLN
INTRAMUSCULAR | Status: DC | PRN
  Administered 2023-12-29: 50 ug via INTRAVENOUS

## 2023-12-29 MED ORDER — METHOCARBAMOL 500 MG PO TABS: 500.0000 mg | ORAL_TABLET | Freq: Three times a day (TID) | ORAL | Status: DC | PRN

## 2023-12-29 MED ORDER — CEFAZOLIN SODIUM-DEXTROSE 2-4 GM/100ML-% IV SOLN
INTRAVENOUS | Status: AC
  Filled 2023-12-29: qty 100

## 2023-12-29 MED ORDER — POTASSIUM CHLORIDE CRYS ER 20 MEQ PO TBCR
40.0000 meq | EXTENDED_RELEASE_TABLET | Freq: Two times a day (BID) | ORAL | Status: DC
  Administered 2023-12-29 – 2023-12-30 (×2): 40 meq via ORAL
  Filled 2023-12-29 (×2): qty 2

## 2023-12-29 MED ORDER — ONDANSETRON HCL 4 MG/2ML IJ SOLN: 4.0000 mg | INTRAMUSCULAR | Status: DC | PRN

## 2023-12-29 MED ORDER — ORAL CARE MOUTH RINSE: 15.0000 mL | Freq: Once | OROMUCOSAL | Status: AC

## 2023-12-29 MED ORDER — PROPOFOL 10 MG/ML IV BOLUS
INTRAVENOUS | Status: AC
  Filled 2023-12-29: qty 20

## 2023-12-29 MED ORDER — HYDROMORPHONE HCL 1 MG/ML IJ SOLN: 0.2500 mg | INTRAMUSCULAR | Status: DC | PRN

## 2023-12-29 MED ORDER — FENTANYL CITRATE (PF) 100 MCG/2ML IJ SOLN
INTRAMUSCULAR | Status: AC
  Filled 2023-12-29: qty 2

## 2023-12-29 MED ORDER — SENNOSIDES-DOCUSATE SODIUM 8.6-50 MG PO TABS: 1.0000 | ORAL_TABLET | Freq: Every evening | ORAL | Status: DC | PRN

## 2023-12-29 MED ORDER — ROSUVASTATIN CALCIUM 5 MG PO TABS
5.0000 mg | ORAL_TABLET | Freq: Every day | ORAL | Status: DC
  Administered 2023-12-29: 5 mg via ORAL
  Filled 2023-12-29: qty 1

## 2023-12-29 MED ORDER — MUPIROCIN 2 % EX OINT: 1.0000 | TOPICAL_OINTMENT | Freq: Two times a day (BID) | CUTANEOUS | Status: DC

## 2023-12-29 MED ORDER — PROPOFOL 10 MG/ML IV BOLUS
INTRAVENOUS | Status: DC | PRN
  Administered 2023-12-29: 200 mg via INTRAVENOUS

## 2023-12-29 MED ORDER — SODIUM CHLORIDE 0.9% FLUSH: 3.0000 mL | INTRAVENOUS | Status: DC | PRN

## 2023-12-29 MED ORDER — AZELASTINE HCL 0.15 % NA SOLN: 2.0000 | Freq: Every day | NASAL | Status: DC

## 2023-12-29 MED ORDER — DOXYCYCLINE HYCLATE 100 MG PO CAPS: 100.0000 mg | ORAL_CAPSULE | Freq: Two times a day (BID) | ORAL | Status: DC

## 2023-12-29 MED ORDER — DEXAMETHASONE SODIUM PHOSPHATE 10 MG/ML IJ SOLN
INTRAMUSCULAR | Status: DC | PRN
  Administered 2023-12-29: 10 mg via INTRAVENOUS

## 2023-12-29 MED ORDER — HYDROMORPHONE HCL 1 MG/ML IJ SOLN: 0.5000 mg | INTRAMUSCULAR | Status: DC | PRN

## 2023-12-29 MED ORDER — TAMSULOSIN HCL 0.4 MG PO CAPS
0.4000 mg | ORAL_CAPSULE | Freq: Every day | ORAL | Status: DC
  Administered 2023-12-29 – 2023-12-30 (×2): 0.4 mg via ORAL
  Filled 2023-12-29 (×2): qty 1

## 2023-12-29 MED ORDER — SODIUM CHLORIDE 0.9 % IR SOLN
Status: DC | PRN
  Administered 2023-12-29: 3000 mL

## 2023-12-29 MED ORDER — GABAPENTIN 300 MG PO CAPS
600.0000 mg | ORAL_CAPSULE | Freq: Every day | ORAL | Status: DC
  Administered 2023-12-29: 600 mg via ORAL
  Filled 2023-12-29: qty 2

## 2023-12-29 MED ORDER — HYOSCYAMINE SULFATE 0.125 MG SL SUBL: 0.1250 mg | SUBLINGUAL_TABLET | SUBLINGUAL | Status: DC | PRN

## 2023-12-29 MED ORDER — OXYCODONE HCL 5 MG PO TABS: 5.0000 mg | ORAL_TABLET | ORAL | Status: DC | PRN

## 2023-12-29 MED ORDER — FLUTICASONE PROPIONATE 50 MCG/ACT NA SUSP
2.0000 | Freq: Every evening | NASAL | Status: DC
  Administered 2023-12-29: 2 via NASAL
  Filled 2023-12-29: qty 16

## 2023-12-29 MED ORDER — AMISULPRIDE (ANTIEMETIC) 5 MG/2ML IV SOLN: 10.0000 mg | Freq: Once | INTRAVENOUS | Status: DC | PRN

## 2023-12-29 MED ORDER — FLEET ENEMA RE ENEM: 1.0000 | ENEMA | Freq: Once | RECTAL | Status: DC | PRN

## 2023-12-29 MED ORDER — OXYCODONE HCL 5 MG PO TABS: 5.0000 mg | ORAL_TABLET | Freq: Once | ORAL | Status: DC | PRN

## 2023-12-29 MED ORDER — SODIUM CHLORIDE 0.9 % IV SOLN: 12.5000 mg | INTRAVENOUS | Status: DC | PRN

## 2023-12-29 MED ORDER — CEFAZOLIN SODIUM-DEXTROSE 2-4 GM/100ML-% IV SOLN
2.0000 g | INTRAVENOUS | Status: AC
  Administered 2023-12-29: 2 g via INTRAVENOUS

## 2023-12-29 MED ORDER — MECLIZINE HCL 25 MG PO TABS: 25.0000 mg | ORAL_TABLET | Freq: Three times a day (TID) | ORAL | Status: DC | PRN

## 2023-12-29 MED ORDER — LACTATED RINGERS IV SOLN: INTRAVENOUS | Status: DC

## 2023-12-29 MED ORDER — DOXYCYCLINE HYCLATE 100 MG PO TABS
100.0000 mg | ORAL_TABLET | Freq: Two times a day (BID) | ORAL | Status: DC
  Administered 2023-12-29 – 2023-12-30 (×2): 100 mg via ORAL
  Filled 2023-12-29 (×2): qty 1

## 2023-12-29 MED ORDER — ZOLPIDEM TARTRATE 5 MG PO TABS: 5.0000 mg | ORAL_TABLET | Freq: Every evening | ORAL | Status: DC | PRN

## 2023-12-29 MED ORDER — LIDOCAINE 2% (20 MG/ML) 5 ML SYRINGE
INTRAMUSCULAR | Status: DC | PRN
  Administered 2023-12-29: 80 mg via INTRAVENOUS

## 2023-12-29 MED ORDER — ONDANSETRON HCL 4 MG/2ML IJ SOLN
INTRAMUSCULAR | Status: DC | PRN
  Administered 2023-12-29: 4 mg via INTRAVENOUS

## 2023-12-29 MED ORDER — BISACODYL 10 MG RE SUPP: 10.0000 mg | Freq: Every day | RECTAL | Status: DC | PRN

## 2023-12-29 MED ORDER — MIRTAZAPINE 15 MG PO TABS: 15.0000 mg | ORAL_TABLET | Freq: Every day | ORAL | Status: DC

## 2023-12-29 SURGICAL SUPPLY — 21 items
BAG URO CATCHER STRL LF (MISCELLANEOUS) ×1 IMPLANT
BASKET STONE NCOMPASS (UROLOGICAL SUPPLIES) IMPLANT
CATH URETERAL DUAL LUMEN 10F (MISCELLANEOUS) IMPLANT
CATH URETL OPEN 5X70 (CATHETERS) IMPLANT
CLOTH BEACON ORANGE TIMEOUT ST (SAFETY) ×1 IMPLANT
EXTRACTOR STONE NITINOL NGAGE (UROLOGICAL SUPPLIES) IMPLANT
GLOVE SURG SS PI 8.0 STRL IVOR (GLOVE) ×1 IMPLANT
GOWN SPEC L4 XLG W/TWL (GOWN DISPOSABLE) ×1 IMPLANT
GUIDEWIRE STR DUAL SENSOR (WIRE) ×1 IMPLANT
IV NS IRRIG 3000ML ARTHROMATIC (IV SOLUTION) ×1 IMPLANT
KIT TURNOVER KIT A (KITS) IMPLANT
LASER FIB FLEXIVA PULSE ID 365 (Laser) IMPLANT
LASER FIB FLEXIVA PULSE ID 550 (Laser) IMPLANT
LASER FIB FLEXIVA PULSE ID 910 (Laser) IMPLANT
MANIFOLD NEPTUNE II (INSTRUMENTS) ×1 IMPLANT
PACK CYSTO (CUSTOM PROCEDURE TRAY) ×1 IMPLANT
SHEATH NAVIGATOR HD 11/13X36 (SHEATH) IMPLANT
STENT URET 6FRX26 CONTOUR (STENTS) IMPLANT
TRACTIP FLEXIVA PULS ID 200XHI (Laser) IMPLANT
TUBING CONNECTING 10 (TUBING) ×1 IMPLANT
TUBING UROLOGY SET (TUBING) ×1 IMPLANT

## 2023-12-29 NOTE — Transfer of Care (Signed)
Immediate Anesthesia Transfer of Care Note  Patient: [name]  Procedure(s) Performed: CYSTOSCOPY/ LEFT URETEROSCOPY/HOLMIUM LASER/STENT PLACEMENT (Left: Ureter)  Patient Location: PACU  Anesthesia Type:General  Level of Consciousness: awake, alert , and oriented  Airway & Oxygen Therapy: Patient Spontanous Breathing and Patient connected to face mask oxygen  Post-op Assessment: Report given to RN and Post -op Vital signs reviewed and stable  Post vital signs: Reviewed and stable  Last Vitals:  Vitals Value Taken Time  BP 174/79 12/29/23 1000  Temp    Pulse 67 12/29/23 1001  Resp 11 12/29/23 1001  SpO2 100 % 12/29/23 1001  Vitals shown include unfiled device data.  Last Pain:  Vitals:   12/29/23 0753  TempSrc:   PainSc: 0-No pain         Complications: No notable events documented.

## 2023-12-29 NOTE — Discharge Instructions (Signed)
We sent your stone to the lab for analysis.  There is a string coming out of the urethra that is attached to the stent that goes to the kidney.   The stent needs to stay in until Friday morning.   You can remove the stent yourself by pulling the string or you can call the office to have it removed before your follow up on 01/12/24. [name]

## 2023-12-29 NOTE — Anesthesia Postprocedure Evaluation (Signed)
Anesthesia Post Note  Patient: [name]  Procedure(s) Performed: CYSTOSCOPY/ LEFT URETEROSCOPY/HOLMIUM LASER/STENT PLACEMENT (Left: Ureter)     Patient location during evaluation: PACU Anesthesia Type: General Level of consciousness: awake and alert Pain management: pain level controlled Vital Signs Assessment: post-procedure vital signs reviewed and stable Respiratory status: spontaneous breathing, nonlabored ventilation and respiratory function stable Cardiovascular status: blood pressure returned to baseline and stable Postop Assessment: no apparent nausea or vomiting Anesthetic complications: no   No notable events documented.  Last Vitals:  Vitals:   12/29/23 1030 12/29/23 1100  BP: (!) 156/74 (!) 153/75  Pulse: 62 (!) 58  Resp: 12 14  Temp:    SpO2: 94% 98%    Last Pain:  Vitals:   12/29/23 1100  TempSrc:   PainSc: 0-No pain                 Lowella Curb

## 2023-12-29 NOTE — Anesthesia Procedure Notes (Signed)
Procedure Name: LMA Insertion Date/Time: 12/29/2023 8:58 AM  Performed by: [place], Clydene Pugh, CRNAPre-anesthesia Checklist: Patient identified, Emergency Drugs available, Suction available and Patient being monitored Patient Re-evaluated:Patient Re-evaluated prior to induction Oxygen Delivery Method: Circle system utilized Preoxygenation: Pre-oxygenation with 100% oxygen Induction Type: IV induction Ventilation: Mask ventilation without difficulty LMA: LMA inserted and LMA with gastric port inserted LMA Size: 4.0 Number of attempts: 1 Airway Equipment and Method: Bite block Placement Confirmation: positive ETCO2 Tube secured with: Tape Dental Injury: Teeth and Oropharynx as per pre-operative assessment

## 2023-12-29 NOTE — Op Note (Signed)
Procedure: 1.  Cystoscopy with left retrograde pyelogram and interpretation. 2.  Left ureteroscopy with holmium laser application, stone extraction and placement of left double-J stent. 3.  Application of fluoroscopy.  Preop diagnosis: Left distal ureteral and renal stones.  Postop diagnosis: Left renal stones.  Surgeon: Dr. Bjorn Pippin.  Anesthesia: General.  Specimen: Stone fragments.  Drains: 6 French by 26 cm left contour double-J stent.  EBL: None.  Complications: None.  Indications: The patient is an 83 year old male who presented with left flank pain and was found to have a left distal ureteral stone along with left renal stones.  He is elected to undergo ureteroscopy for management.  Procedure: He was taken the operating room was given antibiotics.  A general anesthetic was induced.  He was placed in lithotomy position and fitted with PAS hose.  His perineum and genitalia were prepped with Betadine solution and he was draped in usual sterile fashion.  Cystoscopy was performed using a 21 [place] scope and 30 degree lens.  Examination demonstrated a normal urethra.  The external sphincter was intact.  The prostatic urethra had bilobar hyperplasia with some coaptation.  Examination of bladder revealed mild trabeculation.  There were no tumors or stones noted in the bladder.  The right ureteral orifice was unremarkable.  The left ureteral orifice had a small area of hemorrhagic change on the anterior aspect there was suggestive of recent interval stone passage.  A retrograde pyelogram was performed using the 5 [place] open-ended catheter and Omnipaque.  Left retrograde pyelogram demonstrated a normal caliber ureter to the kidney without a distal filling defect.  There was a filling defect in the lower pole consistent with known stones in this area.  A sensor wire was then advanced through the open-ended catheter to the kidney and the cystoscope was removed.  A 36 cm digital access  sheath 11 French inner core was initially passed over the wire without difficulty to the renal pelvis.  This was followed by the assembled sheath.  The inner core and wire were then removed.  A dual-lumen digital flexible scope was then passed to the kidney and the calyceal system was inspected.  Stones were found in an upper calyx, mid calyx and in the lower calyx.  Several of the stones were small enough to remove intact but there were 2 that were too large to bring through the sheath so they were repositioned to the upper pole where they were fragmented with the 242 m holmium laser fiber with the laser on the dusting setting with 0.2 J and 63 Hz on the left pedal and 0.8 J and 10 Hz on the right pedal.  A combination of settings was used to fragment the stones adequately.  The larger fragments were then removed using an engage basket which was also used to remove the smaller stones earlier.  Final inspection revealed only grit and very small fragments that were too small to retrieve and primarily the upper calyces.  Further inspection of the mid and lower calyces revealed no residual stones.  At this point the ureteroscope was removed over a wire and the sheath was removed.  The cystoscope was reinserted over the wire and a 6 [place] by 26 cm contour double-J stent was advanced the kidney under fluoroscopic guidance.  The wire was removed, a good coil in the kidney and a good coil in the bladder.  The bladder was drained and the cystoscope was removed leaving the stent string exiting the urethra.  The  string was secured to the patient's penis.  He was taken down from lithotomy position, his anesthetic was reversed and he was moved recovery in stable condition.  The stone fragments were sent to the lab.  There were no complications.

## 2023-12-29 NOTE — Interval H&P Note (Signed)
History and Physical Interval Note:  12/29/2023 8:23 AM  [name]  has presented today for surgery, with the diagnosis of Left Distal and Renal Stones.  The various methods of treatment have been discussed with the patient and family. After consideration of risks, benefits and other options for treatment, the patient has consented to  Procedure(s) with comments: CYSTOSCOPY/ LEFT URETEROSCOPY/HOLMIUM LASER/STENT PLACEMENT (Left) - 60 MINUTE CASE as a surgical intervention.  The patient's history has been reviewed, patient examined, no change in status, stable for surgery.  I have reviewed the patient's chart and labs.  Questions were answered to the patient's satisfaction.     Bjorn Pippin

## 2023-12-29 NOTE — Consult Note (Signed)
Patient followed by vascular for longstanding lymphedema. I spoke with patient over the phone, patient states he has unna boots placed weekly but is unsure if he has a wound on his legs.    Bedside nurse cut current unna boots off, assessed legs, no wounds noted.  WOC RN will place order for unna boots to be replaced by ortho tech prior to discharge 12/30/2023 for ongoing management of lymphedema.  Patient is to continue weekly follow-up with vascular after discharge.    Thank you,    Priscella Mann MSN, RN-BC, [name] [phone]

## 2023-12-29 NOTE — Plan of Care (Signed)
Patient voiding without difficulty, reports no pain at this time.    Problem: Education: Goal: Knowledge of General Education information will improve Description: Including pain rating scale, medication(s)/side effects and non-pharmacologic comfort measures Outcome: Progressing   Problem: Health Behavior/Discharge Planning: Goal: Ability to manage health-related needs will improve Outcome: Progressing   Problem: Clinical Measurements: Goal: Ability to maintain clinical measurements within normal limits will improve Outcome: Progressing Goal: Will remain free from infection Outcome: Progressing Goal: Diagnostic test results will improve Outcome: Progressing Goal: Respiratory complications will improve Outcome: Progressing Goal: Cardiovascular complication will be avoided Outcome: Progressing   Problem: Activity: Goal: Risk for activity intolerance will decrease Outcome: Progressing   Problem: Nutrition: Goal: Adequate nutrition will be maintained Outcome: Progressing   Problem: Coping: Goal: Level of anxiety will decrease Outcome: Progressing   Problem: Elimination: Goal: Will not experience complications related to bowel motility Outcome: Progressing Goal: Will not experience complications related to urinary retention Outcome: Progressing   Problem: Pain Managment: Goal: General experience of comfort will improve and/or be controlled Outcome: Progressing   Problem: Safety: Goal: Ability to remain free from injury will improve Outcome: Progressing   Problem: Skin Integrity: Goal: Risk for impaired skin integrity will decrease Outcome: Progressing

## 2023-12-29 NOTE — Progress Notes (Signed)
Orthopedic Tech Progress Note Patient Details:  [name] 03-01-1941 [id number]  Ortho Devices Type of Ortho Device: Roland Rack boot Ortho Device/Splint Location: bi lat unna boot Ortho Device/Splint Interventions: Ordered, Application, Adjustment   Post Interventions Patient Tolerated: Well Instructions Provided: Adjustment of device, Care of device  Kizzie Fantasia 12/29/2023, 3:51 PM

## 2023-12-30 ENCOUNTER — Other Ambulatory Visit (HOSPITAL_COMMUNITY): Payer: Self-pay

## 2023-12-30 ENCOUNTER — Encounter (HOSPITAL_COMMUNITY): Payer: Self-pay | Admitting: Urology

## 2023-12-30 DIAGNOSIS — J45909 Unspecified asthma, uncomplicated: Secondary | ICD-10-CM | POA: Diagnosis not present

## 2023-12-30 DIAGNOSIS — Z79899 Other long term (current) drug therapy: Secondary | ICD-10-CM | POA: Diagnosis not present

## 2023-12-30 DIAGNOSIS — N132 Hydronephrosis with renal and ureteral calculous obstruction: Secondary | ICD-10-CM | POA: Diagnosis not present

## 2023-12-30 DIAGNOSIS — N184 Chronic kidney disease, stage 4 (severe): Secondary | ICD-10-CM | POA: Diagnosis not present

## 2023-12-30 DIAGNOSIS — I129 Hypertensive chronic kidney disease with stage 1 through stage 4 chronic kidney disease, or unspecified chronic kidney disease: Secondary | ICD-10-CM | POA: Diagnosis not present

## 2023-12-30 MED ORDER — ADULT MULTIVITAMIN W/MINERALS CH
1.0000 | ORAL_TABLET | Freq: Every morning | ORAL | 0 refills | Status: DC
  Filled 2023-12-30: qty 90, 90d supply, fill #0

## 2023-12-30 MED ORDER — MUPIROCIN 2 % EX OINT
1.0000 | TOPICAL_OINTMENT | Freq: Two times a day (BID) | CUTANEOUS | 0 refills | Status: AC
  Filled 2023-12-30: qty 22, 11d supply, fill #0

## 2023-12-30 MED ORDER — PHENAZOPYRIDINE HCL 200 MG PO TABS: 200.0000 mg | ORAL_TABLET | Freq: Three times a day (TID) | ORAL | 1 refills | Status: DC | PRN

## 2023-12-30 MED ORDER — DOXYCYCLINE HYCLATE 100 MG PO CAPS
100.0000 mg | ORAL_CAPSULE | Freq: Two times a day (BID) | ORAL | 0 refills | Status: AC
  Filled 2023-12-30: qty 14, 7d supply, fill #0

## 2023-12-30 MED ORDER — AZELASTINE HCL 0.1 % NA SOLN
2.0000 | Freq: Every day | NASAL | Status: DC
Start: 2023-12-30 — End: 2023-12-30
  Administered 2023-12-30: 2 via NASAL
  Filled 2023-12-30: qty 30

## 2023-12-30 NOTE — Care Management Obs Status (Signed)
MEDICARE OBSERVATION STATUS NOTIFICATION   Patient Details  Name: [name] MRN: [id number] Date of Birth: [birth year]   Medicare Observation Status Notification Given:  Yes    Ewing Schlein, LCSW 12/30/2023, 8:16 AM

## 2023-12-30 NOTE — Progress Notes (Signed)
Discharge instructions reviewed with patient. All questions answered. Pt stated he needs some scripts send to the [facility] outpatient pharmacy. MD notified. Awaiting response.  Pt transportation home will be here around 10-11am, hope to have patient ready to d/c at that time.

## 2023-12-30 NOTE — Progress Notes (Signed)
   12/30/23 0822  TOC Brief Assessment  Insurance and Status Reviewed  Patient has primary care physician Yes  Home environment has been reviewed Resides alone  Prior level of function: Independent with ADLs at baseline  Prior/Current Home Services No current home services  Social Drivers of Health Review SDOH reviewed no interventions necessary  Readmission risk has been reviewed Yes  Transition of care needs no transition of care needs at this time

## 2023-12-30 NOTE — Plan of Care (Signed)

## 2023-12-30 NOTE — Progress Notes (Signed)
   12/30/23 0013  BiPAP/CPAP/SIPAP  $ Non-Invasive Home Ventilator  Initial  BiPAP/CPAP/SIPAP Pt Type Adult  BiPAP/CPAP/SIPAP DREAMSTATIOND  Mask Type Nasal mask  Dentures removed? Not applicable  Mask Size Large  Patient Home Equipment No  Auto Titrate Yes  BiPAP/CPAP /SiPAP Vitals  Pulse Rate 99  Resp 18  SpO2 95 %  Bilateral Breath Sounds Clear;Diminished  MEWS Score/Color  MEWS Score 0  MEWS Score Color Ch[place] Si

## 2023-12-30 NOTE — Discharge Summary (Signed)
Physician Discharge Summary  Patient ID: [name] MRN: [id number] DOB/AGE: Dec 03, 1940 83 y.o.  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   Hyperlipidemia    Joint pain    Neuromuscular disorder (HCC)    Pre-diabetes    Prediabetes    Sleep apnea    uses CPAP nightly   SOB (shortness of breath)    Swallowing difficulty    Swelling of lower extremity    right more than leg leg   Umbilical hernia     Surgeries: Procedure(s): CYSTOSCOPY/ LEFT URETEROSCOPY/HOLMIUM LASER/STENT PLACEMENT on 12/29/2023   Consultants (if any):   Discharged Condition: Improved  Hospital Course: [name] is an 83 y.o. male who was admitted 12/29/2023 with a diagnosis of Left ureteral stone and went to the operating room on 12/29/2023 and underwent the above named procedures.  He stay overnight because of transportation issues.  He is doing well this morning with resolving hematuria but some stent irritation.   He was given perioperative antibiotics:  Anti-infectives (From admission, onward)    Start     Dose/Rate Route Frequency Ordered Stop   12/29/23 2200  doxycycline (VIBRA-TABS) tablet 100 mg        100 mg Oral Every 12 hours 12/29/23 1313     12/29/23 1345  doxycycline  (VIBRAMYCIN) capsule 100 mg  Status:  Discontinued        100 mg Oral 2 times daily 12/29/23 1259 12/29/23 1313   12/29/23 0727  ceFAZolin (ANCEF) 2-4 GM/100ML-% IVPB       Note to Pharmacy: Jolyn Nap B: cabinet override      12/29/23 0727 12/29/23 0908   12/29/23 0721  ceFAZolin (ANCEF) IVPB 2g/100 mL premix        2 g 200 mL/hr over 30 Minutes Intravenous 30 min pre-op 12/29/23 0721 12/29/23 0901     .  He was given sequential compression devicesfor DVT prophylaxis.  He benefited maximally from the hospital stay and there were no complications.    Recent vital signs:  Vitals:   12/29/23 2215 12/30/23 0013  BP: 129/73   Pulse: (!) 102 99  Resp: 14 18  Temp: 98 F (36.7 C)   SpO2: 94% 95%    Recent laboratory studies:  Lab Results  Component Value Date   HGB 12.8 (L) 12/29/2023   HGB 13.2 12/22/2023   HGB 11.8 (L) 08/26/2023   Lab Results  Component Value Date   WBC 6.1 12/29/2023   PLT 156 12/29/2023   Lab Results  Component Value Date   INR 1.14 03/21/2015   Lab Results  Component Value Date   NA 138 12/29/2023   K 4.4 12/29/2023   CL 101 12/29/2023   CO2 24 12/29/2023   BUN 23 12/29/2023   CREATININE 1.82 (H) 12/29/2023   GLUCOSE 163 (H) 12/29/2023    Discharge Medications:   Allergies as of 12/30/2023   No Known Allergies      Medication List     TAKE these medications    Astepro 205.5 MCG/SPRAY Soln Generic drug: Azelastine HCl Place 2 sprays into both nostrils in the morning.   buPROPion 150 MG 24 hr tablet Commonly known as: WELLBUTRIN XL Take 450 mg by mouth every morning.   doxycycline 100 MG capsule Commonly known as: VIBRAMYCIN Take 1 capsule (100 mg total) by mouth 2 (two) times daily for 7 days.   Flonase Allergy Relief 50 MCG/ACT nasal spray Generic drug: fluticasone Place 2 sprays into both nostrils every evening.   furosemide 40 MG tablet Commonly known as: LASIX TAKE 1 TABLET BY MOUTH EVERY MORNING AND AN  ADDITIONAL TABLET DAILY AS NEEDED FOR UNRESOLVED SWELLING OR FLUID RETENTION   gabapentin 300 MG capsule Commonly known as: NEURONTIN TAKE 1 TO 2 CAPSULES BY MOUTH AT BEDTIME What changed: See the new instructions.   meclizine 25 MG tablet Commonly known as: ANTIVERT Take 1 tablet (25 mg total) by mouth 3 (three) times daily as needed for dizziness.   methocarbamol 500 MG tablet Commonly known as: ROBAXIN Take 1 tablet (500 mg total) by mouth every 8 (eight) hours as needed for muscle spasms. What changed: when to take this   mirtazapine 15 MG tablet Commonly known as: REMERON Take 15 mg by mouth at bedtime.   montelukast 10 MG tablet Commonly known as: SINGULAIR Take 1 tablet (10 mg total) by mouth at bedtime.   multivitamin with minerals Tabs tablet Take 1 tablet by mouth every morning.   mupirocin ointment 2 % Commonly known as: BACTROBAN Apply 1 Application topically 2 (two) times daily for 7 days.   pantoprazole 40 MG tablet Commonly known as: PROTONIX Take 1 tablet (40 mg total) by mouth 2 (two) times daily before a meal.   potassium chloride SA 20 MEQ tablet Commonly known as: KLOR-CON M Take 2 tablets (40 mEq total) by mouth 2 (two) times daily.   rosuvastatin 5 MG tablet Commonly known as: CRESTOR Take 5 mg by mouth at bedtime.   tamsulosin 0.4 MG Caps capsule Commonly known as: FLOMAX Take 0.4 mg by mouth daily.        Diagnostic Studies: DG C-Arm 1-60 Min-No Report Result Date: 12/29/2023 Fluoroscopy was utilized by the requesting physician.  No radiographic interpretation.    Disposition: Discharge disposition: 01-Home or Self Care       Discharge Instructions     Discharge patient   Complete by: As directed    Discharge disposition: 01-Home or Self Care   Discharge patient date: 12/30/2023   Discontinue IV   Complete by: As directed         Follow-up Information     Hillery Aldo, NP Follow up on 01/12/2024.   Specialty:  Nurse Practitioner Why: [phone] Contact information: [address] [place] [address] [phone]                  Signed: Bjorn Pippin 12/30/2023, 8:02 AM

## 2024-01-01 DIAGNOSIS — N202 Calculus of kidney with calculus of ureter: Secondary | ICD-10-CM | POA: Diagnosis not present

## 2024-01-06 LAB — CALCULI, WITH PHOTOGRAPH (CLINICAL LAB)
Calcium Oxalate Dihydrate: 20 %
Calcium Oxalate Monohydrate: 80 %
Weight Calculi: 160 mg

## 2024-01-07 DIAGNOSIS — R0989 Other specified symptoms and signs involving the circulatory and respiratory systems: Secondary | ICD-10-CM | POA: Diagnosis not present

## 2024-01-07 DIAGNOSIS — R0602 Shortness of breath: Secondary | ICD-10-CM | POA: Diagnosis not present

## 2024-01-07 DIAGNOSIS — R5381 Other malaise: Secondary | ICD-10-CM | POA: Diagnosis not present

## 2024-01-07 DIAGNOSIS — R5383 Other fatigue: Secondary | ICD-10-CM | POA: Diagnosis not present

## 2024-01-07 DIAGNOSIS — N1832 Chronic kidney disease, stage 3b: Secondary | ICD-10-CM | POA: Diagnosis not present

## 2024-01-11 DIAGNOSIS — Z6839 Body mass index (BMI) 39.0-39.9, adult: Secondary | ICD-10-CM | POA: Diagnosis not present

## 2024-01-11 DIAGNOSIS — Z711 Person with feared health complaint in whom no diagnosis is made: Secondary | ICD-10-CM | POA: Diagnosis not present

## 2024-01-11 DIAGNOSIS — N200 Calculus of kidney: Secondary | ICD-10-CM | POA: Diagnosis not present

## 2024-01-11 DIAGNOSIS — R0602 Shortness of breath: Secondary | ICD-10-CM | POA: Diagnosis not present

## 2024-01-15 ENCOUNTER — Encounter: Payer: Self-pay | Admitting: Podiatry

## 2024-01-15 ENCOUNTER — Ambulatory Visit (INDEPENDENT_AMBULATORY_CARE_PROVIDER_SITE_OTHER): Admitting: Podiatry

## 2024-01-15 DIAGNOSIS — R609 Edema, unspecified: Secondary | ICD-10-CM

## 2024-01-15 DIAGNOSIS — I872 Venous insufficiency (chronic) (peripheral): Secondary | ICD-10-CM

## 2024-01-15 DIAGNOSIS — R7303 Prediabetes: Secondary | ICD-10-CM | POA: Diagnosis not present

## 2024-01-15 MED ORDER — DOXYCYCLINE HYCLATE 100 MG PO TABS: 100.0000 mg | ORAL_TABLET | Freq: Two times a day (BID) | ORAL | 0 refills | Status: AC

## 2024-01-17 ENCOUNTER — Encounter: Payer: Self-pay | Admitting: Podiatry

## 2024-01-17 NOTE — Progress Notes (Signed)
 This patient presents to the office stating he has bleeding in his bandages right foot.  He also says  the bandage was applied incorrectly  and he has red swollen right foot  plus the bandage was not put on high enough on his right leg.  He is concerned about this unna boot causing future problems.  According to [facility] he just arrived and he was put on her schedule. There are no notes from vein and vascular so most of his history is from what he said.   I saw this patient with her since all the doctors were busy with patients.   He has history of CKD, edema , and venous insufficiency.    General Appearance  Alert, conversant and in no acute stress.  Vascular  Dorsalis pedis and posterior tibial  pulses are  not palpable due to his swelling. bilaterally.  Capillary return is within normal limits  bilaterally.. Cold feet noted. bilaterally.  Neurologic  Senn-Weinstein monofilament wire test within normal limits  bilaterally. Muscle power within normal limits bilaterally.  Nails Thick disfigured discolored nails with subungual debris  from hallux to fifth toes bilaterally. No evidence of bacterial infection or drainage bilaterally.  Orthopedic deferred since he was wearing an unna boot right leg/foot.  Skin  red swollen dorsal of right foot distal to the unna boot above the metatarsals right foot.  No signs of drainage noted.  His foot is not hot therefore probably not infected.    Swelling right foot secondary to unna boot.  ROV.  Unna boot was removed.  His proximal foot and leg has minimal swelling.  Re-application of unna boot loosely extending from toes to the upper lower leg. Dr.  Allena Katz rendered a second opinion and he suggested a course of doxycycline. Prescribed doxycycline which will be continued or stopped upon further examination. This patient will  be followed  up with Dr.  Ralene Cork on Monday   Helane Gunther DPM

## 2024-01-18 ENCOUNTER — Ambulatory Visit: Admitting: Podiatry

## 2024-01-19 ENCOUNTER — Ambulatory Visit: Admitting: Podiatry

## 2024-01-26 ENCOUNTER — Ambulatory Visit (INDEPENDENT_AMBULATORY_CARE_PROVIDER_SITE_OTHER): Admitting: Podiatry

## 2024-01-26 DIAGNOSIS — Z91199 Patient's noncompliance with other medical treatment and regimen due to unspecified reason: Secondary | ICD-10-CM

## 2024-01-26 NOTE — Progress Notes (Signed)
 No show

## 2024-02-02 ENCOUNTER — Ambulatory Visit: Payer: Medicare Other | Admitting: Gastroenterology

## 2024-02-02 NOTE — Progress Notes (Deleted)
 HPI :  Seen 10/2023:  83 year old white male, previously established with Dr. Orvan Falconer who comes in today to discuss possible EGD and colonoscopy.  He was referred back by Dr. Selena Batten. He relates that he has had a lot of health issues recently, has had worsening chronic kidney disease and had been seen by nephrology within the past couple of months.  He has been on chronic PPI therapy with pantoprazole and that had been stopped due to concerns about chronic kidney disease.  He thinks that he has had another medication to take but cannot tell me the name of that medication today.  He says that his acid reflux symptoms have been worse over the past couple of months.  He says if he eats much at all he will feel full and uncomfortable and almost ill in his upper abdomen.  He also develops a lot of belching and burping which will go on for sometimes an hour after eating.  He says he has had a lot of issues with heartburn and indigestion in the past but has never felt actually sick with the symptoms or had such episodes with belching burping and some nausea in the past.  He is not having any dysphagia or odynophagia.  He is not having any pain radiating into his back, no fever or chills, no diarrhea.  He has on occasion had some pain in his right mid abdomen, and feels that he has been more constipated recently as well. He did undergo some recent cardiac workup with Myoview stress testing which was normal and EF documented at 55 to 65%. He also had Zio patch monitoring which was unremarkable. He has history of hypertension, sleep apnea without oxygen use, asthma, tracheobronchial malacia, chronic kidney disease stage IIIb, anxiety and depression.   Prior colonoscopy had been done in October 2021 per Dr. Orvan Falconer for follow-up of adenomatous colon polyps.  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  Path on  the polyps combination of tubular adenoma, sessile serrated and hyperplastic.  And follow-up in 3 years was to be considered. He also had EGD at that time and was found to have short segment Barrett's esophagus, no dysplasia multiple gastric polyps which were fundic land polyps, duodenal and gastric biopsies were negative, no H. pylori.     83 year old white male with history of longstanding chronic GERD, now off PPI therapy over the past couple of months for nephrology due to progression of chronic kidney disease. Patient has had significant daily belching and burping, epigastric discomfort, intermittent queasiness/nausea over the past couple of months. I suspect the symptoms are secondary to poorly controlled chronic GERD, will also need to consider other intra-abdominal pathology, biliary colic etc.   #2 history of Barrett's esophagus initial diagnosis October 2021, and due for surveillance #3 history of adenomatous and sessile serrated polyps-last colonoscopy October 2021 with 7 diminutive polyps removed #4 mild constipation #5 obesity 6.  Sleep apnea with CPAP use no oxygen 7.  Chronic lower extremity edema 8.  Recent negative Myoview stress test, and EF documented 60 to 65% at that time 9.  Chronic exertional dyspnea #10 chronic kidney disease stage III 11.  BPH 10.  Hypertension #11 history of kidney stones   Plan; strict antireflux regimen,  Start famotidine 40 mg p.o. twice daily AC, prescription sent We discussed starting MiraLAX 17 g in 8 ounces  of water daily for constipation symptoms. Due to his advanced age of 24 and other comorbidities, I do not feel that he needs to do further surveillance colonoscopies and this was discussed with him. We can consider repeat EGD for surveillance of Barrett's,. Would also like to proceed with upper abdominal ultrasound, and obtain copy of his recent CT scan done by urology for review. If he does not have improvement of his upper GI symptoms  with twice daily famotidine, then we may need to schedule for EGD and do further imaging. Patient was in a hurry as he had an appointment in [place] early this afternoon, we will need to call him and arrange for the upper abdominal ultrasound. Patient will be established with Dr. Adela Lank.   Amy Oswald Hillock PA-C 10/22/2023   Agree with assessment and plan as outlined.  Difficult situation - he does have a short segment of Barrett's, ideally would like him on PPI to reduce risk of esophageal cancer from this segment, but understand with progressive CKD it is possible PPI could be contributing to that and how it was held. Agree with starting pepcid. May consider EGD to re-evaluate his esophagus off PPI to make sure no interval worsening of his Barrett's / progression.       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   Hyperlipidemia    Joint pain    Neuromuscular disorder (HCC)    Pre-diabetes    Prediabetes    Sleep apnea    uses CPAP nightly   SOB (shortness of breath)    Swallowing difficulty    Swelling of lower extremity    right more than leg leg   Umbilical hernia      Past Surgical History:  Procedure Laterality Date   ABDOMINAL AORTOGRAM W/LOWER EXTREMITY N/A 03/14/2020   Procedure: ABDOMINAL AORTOGRAM W/LOWER EXTREMITY;  Surgeon: Nada Libman, MD;  Location: [facility];  Service: Vascular;  Laterality:  N/A;   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   AMPUTATION TOE Right 04/18/2022   Procedure: AMPUTATION RIGHT GREAT TOE;  Surgeon: Jodi Geralds, MD;  Location: [facility];  Service: Orthopedics;  Laterality: Right;   BACK SURGERY  2003   L4, L5   BACK SURGERY     CATARACT EXTRACTION, BILATERAL     CYSTOSCOPY/URETEROSCOPY/HOLMIUM LASER/STENT PLACEMENT Left 12/29/2023   Procedure: CYSTOSCOPY/ LEFT URETEROSCOPY/HOLMIUM LASER/STENT PLACEMENT;  Surgeon: Bjorn Pippin, MD;  Location: [facility];  Service: Urology;  Laterality: Left;  60 MINUTE CASE   ENDOVENOUS ABLATION SAPHENOUS VEIN W/ LASER Right 06-01-2014   EVLA right greater saphenous vein by Gretta Began MD    epidural steroid injections        [facility] dr Alvester Morin  hernia repair  2003   HERNIA REPAIR     hernia inguinal x 2   TOTAL HIP ARTHROPLASTY Left 05/05/2022   Procedure: LEFT TOTAL HIP ARTHROPLASTY ANTERIOR APPROACH;  Surgeon: Jodi Geralds, MD;  Location: [facility];  Service: Orthopedics;  Laterality: Left;   TOTAL KNEE ARTHROPLASTY Left 03/22/2015   Procedure: TOTAL KNEE ARTHROPLASTY;  Surgeon: Cammy Copa, MD;  Location: [facility] OR;  Service: Orthopedics;  Laterality: Left;   UMBILICAL HERNIA REPAIR N/A 06/27/2016   Procedure: LAPAROSCOPIC ASSISTED REPAIR OF UMBILICAL HERINA WITH MESH;  Surgeon: Luretha Murphy, MD;  Location: [facility];  Service: General;  Laterality: N/A;   VASCULAR SURGERY  2015   right leg   Family History  Problem Relation Age of Onset   Thyroid disease Mother    Heart disease Father    Hypertension Father    Diabetes Maternal Grandfather    Colon cancer Neg Hx    Esophageal cancer Neg Hx    Social History   Tobacco Use   Smoking status: Never   Smokeless tobacco: Never  Vaping Use   Vaping status: Never Used  Substance Use Topics   Alcohol use: Yes    Comment: rare   Drug use: No   Current Outpatient Medications  Medication Sig Dispense Refill   ASTEPRO 205.5 MCG/SPRAY SOLN Place 2  sprays into both nostrils in the morning.     buPROPion (WELLBUTRIN XL) 150 MG 24 hr tablet Take 450 mg by mouth every morning.     fluticasone (FLONASE ALLERGY RELIEF) 50 MCG/ACT nasal spray Place 2 sprays into both nostrils every evening.     furosemide (LASIX) 40 MG tablet TAKE 1 TABLET BY MOUTH EVERY MORNING AND AN ADDITIONAL TABLET DAILY AS NEEDED FOR UNRESOLVED SWELLING OR FLUID RETENTION 135 tablet 2   gabapentin (NEURONTIN) 300 MG capsule TAKE 1 TO 2 CAPSULES BY MOUTH AT BEDTIME (Patient taking differently: Take 600 mg by mouth at bedtime.) 60 capsule 0   meclizine (ANTIVERT) 25 MG tablet Take 1 tablet (25 mg total) by mouth 3 (three) times daily as needed for dizziness. 30 tablet 0   methocarbamol (ROBAXIN) 500 MG tablet Take 1 tablet (500 mg total) by mouth every 8 (eight) hours as needed for muscle spasms. (Patient taking differently: Take 500 mg by mouth in the morning and at bedtime.) 20 tablet 0   mirtazapine (REMERON) 15 MG tablet Take 15 mg by mouth at bedtime.     montelukast (SINGULAIR) 10 MG tablet Take 1 tablet (10 mg total) by mouth at bedtime. 30 tablet 5   Multiple Vitamin (MULTIVITAMIN WITH MINERALS) TABS tablet Take 1 tablet by mouth every morning. 90 tablet 0   pantoprazole (PROTONIX) 40 MG tablet Take 1 tablet (40 mg total) by mouth 2 (two) times daily before a meal. 60 tablet 3   potassium chloride SA (KLOR-CON M) 20 MEQ tablet Take 2 tablets (40 mEq total) by mouth 2 (two) times daily. 120 tablet 3   rosuvastatin (CRESTOR) 5 MG tablet Take 5 mg by mouth at bedtime.     tamsulosin (FLOMAX) 0.4 MG CAPS capsule Take 0.4 mg by mouth daily.     No current facility-administered medications for this visit.   No Known Allergies   Review of Systems: All systems reviewed and negative except where noted in HPI.    No results found.  Physical Exam: There were no vitals taken for this visit. Constitutional: Pleasant,well-developed, ***male in no acute distress. HEENT:  Normocephalic and atraumatic.  Conjunctivae are normal. No scleral icterus. Neck supple.  Cardiovascular: Normal rate, regular rhythm.  Pulmonary/chest: Effort normal and breath sounds normal. No wheezing, rales or rhonchi. Abdominal: Soft, nondistended, nontender. Bowel sounds active throughout. There are no masses palpable. No hepatomegaly. Extremities: no edema Lymphadenopathy: No cervical adenopathy noted. Neurological: Alert and oriented to person place and time. Skin: Skin is warm and dry. No rashes noted. Psychiatric: Normal mood and affect. Behavior is normal.   ASSESSMENT: 83 y.o. male here for assessment of the following  No diagnosis found.  PLAN:   Gweneth Dimitri, MD

## 2024-02-03 ENCOUNTER — Emergency Department (HOSPITAL_COMMUNITY)

## 2024-02-03 ENCOUNTER — Emergency Department (HOSPITAL_COMMUNITY)
Admission: EM | Admit: 2024-02-03 | Discharge: 2024-02-04 | Disposition: A | Discharge status: Home / Self Care | Attending: Emergency Medicine | Admitting: Emergency Medicine

## 2024-02-03 DIAGNOSIS — S81011A Laceration without foreign body, right knee, initial encounter: Secondary | ICD-10-CM | POA: Diagnosis not present

## 2024-02-03 DIAGNOSIS — M19011 Primary osteoarthritis, right shoulder: Secondary | ICD-10-CM | POA: Diagnosis not present

## 2024-02-03 DIAGNOSIS — S80211A Abrasion, right knee, initial encounter: Secondary | ICD-10-CM | POA: Insufficient documentation

## 2024-02-03 DIAGNOSIS — D649 Anemia, unspecified: Secondary | ICD-10-CM | POA: Diagnosis not present

## 2024-02-03 DIAGNOSIS — I10 Essential (primary) hypertension: Secondary | ICD-10-CM | POA: Diagnosis not present

## 2024-02-03 DIAGNOSIS — M25519 Pain in unspecified shoulder: Secondary | ICD-10-CM | POA: Diagnosis not present

## 2024-02-03 DIAGNOSIS — S43014A Anterior dislocation of right humerus, initial encounter: Secondary | ICD-10-CM | POA: Insufficient documentation

## 2024-02-03 DIAGNOSIS — I129 Hypertensive chronic kidney disease with stage 1 through stage 4 chronic kidney disease, or unspecified chronic kidney disease: Secondary | ICD-10-CM | POA: Insufficient documentation

## 2024-02-03 DIAGNOSIS — W19XXXA Unspecified fall, initial encounter: Secondary | ICD-10-CM | POA: Diagnosis not present

## 2024-02-03 DIAGNOSIS — M25461 Effusion, right knee: Secondary | ICD-10-CM | POA: Diagnosis not present

## 2024-02-03 DIAGNOSIS — R739 Hyperglycemia, unspecified: Secondary | ICD-10-CM | POA: Insufficient documentation

## 2024-02-03 DIAGNOSIS — N289 Disorder of kidney and ureter, unspecified: Secondary | ICD-10-CM | POA: Diagnosis not present

## 2024-02-03 DIAGNOSIS — R0600 Dyspnea, unspecified: Secondary | ICD-10-CM | POA: Diagnosis not present

## 2024-02-03 DIAGNOSIS — Z743 Need for continuous supervision: Secondary | ICD-10-CM | POA: Diagnosis not present

## 2024-02-03 DIAGNOSIS — M25511 Pain in right shoulder: Secondary | ICD-10-CM | POA: Diagnosis present

## 2024-02-03 DIAGNOSIS — N189 Chronic kidney disease, unspecified: Secondary | ICD-10-CM | POA: Diagnosis not present

## 2024-02-03 DIAGNOSIS — M1711 Unilateral primary osteoarthritis, right knee: Secondary | ICD-10-CM | POA: Diagnosis not present

## 2024-02-03 DIAGNOSIS — R58 Hemorrhage, not elsewhere classified: Secondary | ICD-10-CM | POA: Diagnosis not present

## 2024-02-03 DIAGNOSIS — S43004A Unspecified dislocation of right shoulder joint, initial encounter: Secondary | ICD-10-CM | POA: Diagnosis not present

## 2024-02-03 DIAGNOSIS — J9811 Atelectasis: Secondary | ICD-10-CM | POA: Diagnosis not present

## 2024-02-03 DIAGNOSIS — I517 Cardiomegaly: Secondary | ICD-10-CM | POA: Diagnosis not present

## 2024-02-03 MED ORDER — PROPOFOL 10 MG/ML IV BOLUS
0.5000 mg/kg | Freq: Once | INTRAVENOUS | Status: AC
  Administered 2024-02-03: 55.6 mg via INTRAVENOUS
  Filled 2024-02-03: qty 20

## 2024-02-03 MED ORDER — LACTATED RINGERS IV BOLUS
500.0000 mL | Freq: Once | INTRAVENOUS | Status: AC
  Administered 2024-02-03: 500 mL via INTRAVENOUS

## 2024-02-03 MED ORDER — HYDROCODONE-ACETAMINOPHEN 5-325 MG PO TABS
1.0000 | ORAL_TABLET | Freq: Once | ORAL | Status: AC
  Administered 2024-02-03: 1 via ORAL
  Filled 2024-02-03: qty 1

## 2024-02-03 MED ORDER — FENTANYL CITRATE PF 50 MCG/ML IJ SOSY
50.0000 ug | PREFILLED_SYRINGE | Freq: Once | INTRAMUSCULAR | Status: AC
  Administered 2024-02-03: 50 ug via INTRAVENOUS
  Filled 2024-02-03: qty 1

## 2024-02-03 MED ORDER — HYDRALAZINE HCL 20 MG/ML IJ SOLN
5.0000 mg | Freq: Once | INTRAMUSCULAR | Status: AC
  Administered 2024-02-03: 5 mg via INTRAVENOUS
  Filled 2024-02-03: qty 1

## 2024-02-03 NOTE — ED Provider Notes (Signed)
 Care assumed from Dr. Criss Alvine, patient with shoulder dislocation, uncontrolled hypertension. Shoulder has been reduced, he is getting labs and chest x-ray. If he does not need admission, will need TOC consult (unable to care for himself with right arm immobilized).  I have reviewed his laboratory test, my interpretation is stable anemia, elevated random glucose which need to be followed as an outpatient, stable renal insufficiency.  Chest x-ray shows no acute cardiopulmonary process.  Have independently viewed the image, and agree with radiologist interpretation.  Blood pressure has come down very well with single dose of hydralazine.  However, he is not able to care for himself, will need either home health assistance or skilled nursing home placement.  I am holding him in the emergency department waiting for transitions of care consult.  Results for orders placed or performed during the hospital encounter of 02/03/24  Comprehensive metabolic panel   Collection Time: 02/03/24 11:40 PM  Result Value Ref Range   Sodium 138 135 - 145 mmol/L   Potassium 3.7 3.5 - 5.1 mmol/L   Chloride 105 98 - 111 mmol/L   CO2 25 22 - 32 mmol/L   Glucose, Bld 118 (H) 70 - 99 mg/dL   BUN 27 (H) 8 - 23 mg/dL   Creatinine, Ser 1.61 (H) 0.61 - 1.24 mg/dL   Calcium 9.3 8.9 - 09.6 mg/dL   Total Protein 7.2 6.5 - 8.1 g/dL   Albumin 4.0 3.5 - 5.0 g/dL   AST 40 15 - 41 U/L   ALT 41 0 - 44 U/L   Alkaline Phosphatase 90 38 - 126 U/L   Total Bilirubin 0.7 0.0 - 1.2 mg/dL   GFR, Estimated 33 (L) >60 mL/min   Anion gap 8 5 - 15  CBC with Differential   Collection Time: 02/03/24 11:40 PM  Result Value Ref Range   WBC 8.7 4.0 - 10.5 K/uL   RBC 3.74 (L) 4.22 - 5.81 MIL/uL   Hemoglobin 12.5 (L) 13.0 - 17.0 g/dL   HCT 04.5 (L) 40.9 - 81.1 %   MCV 101.3 (H) 80.0 - 100.0 fL   MCH 33.4 26.0 - 34.0 pg   MCHC 33.0 30.0 - 36.0 g/dL   RDW 91.4 78.2 - 95.6 %   Platelets 170 150 - 400 K/uL   nRBC 0.0 0.0 - 0.2 %   Neutrophils  Relative % 80 %   Neutro Abs 7.0 1.7 - 7.7 K/uL   Lymphocytes Relative 9 %   Lymphs Abs 0.8 0.7 - 4.0 K/uL   Monocytes Relative 8 %   Monocytes Absolute 0.7 0.1 - 1.0 K/uL   Eosinophils Relative 2 %   Eosinophils Absolute 0.2 0.0 - 0.5 K/uL   Basophils Relative 0 %   Basophils Absolute 0.0 0.0 - 0.1 K/uL   Immature Granulocytes 1 %   Abs Immature Granulocytes 0.05 0.00 - 0.07 K/uL  Troponin I (High Sensitivity)   Collection Time: 02/03/24 11:40 PM  Result Value Ref Range   Troponin I (High Sensitivity) 9 <18 ng/L   *Note: Due to a large number of results and/or encounters for the requested time period, some results have not been displayed. A complete set of results can be found in Results Review.   DG Chest Portable 1 View Result Date: 02/03/2024 CLINICAL DATA:  Dyspnea EXAM: PORTABLE CHEST 1 VIEW COMPARISON:  08/26/2023 FINDINGS: Cardiac shadow is enlarged but stable. The lungs are well aerated bilaterally. No focal infiltrate or effusion is seen. Stable right basilar atelectasis is  noted. No bony abnormality is noted. IMPRESSION: No acute abnormality noted. Electronically Signed   By: Alcide Clever M.D.   On: 02/03/2024 23:42   DG Shoulder Right Portable Result Date: 02/03/2024 CLINICAL DATA:  reduction EXAM: RIGHT SHOULDER - 1 VIEW COMPARISON:  X-ray right shoulder 02/03/2024 9:45 p.m. FINDINGS: Interval reduction of the shoulder. There is no evidence of fracture or dislocation. Acromioclavicular joint degenerative changes. Soft tissues are unremarkable. IMPRESSION: No acute displaced fracture or dislocation. Electronically Signed   By: Tish Frederickson M.D.   On: 02/03/2024 23:18   DG Knee Complete 4 Views Right Result Date: 02/03/2024 CLINICAL DATA:  Fall laceration to patella EXAM: RIGHT KNEE - COMPLETE 4+ VIEW COMPARISON:  None Available. FINDINGS: No fracture or malalignment. Positive for knee effusion. Tricompartment arthritis of the knee with advanced medial tibiofemoral and  patellofemoral degenerative change. IMPRESSION: No acute osseous abnormality. Knee effusion. Tricompartment arthritis. Electronically Signed   By: Jasmine Pang M.D.   On: 02/03/2024 22:08   DG Shoulder Right Result Date: 02/03/2024 CLINICAL DATA:  Fall EXAM: RIGHT SHOULDER - 2+ VIEW COMPARISON:  07/23/2018 FINDINGS: Anterior dislocation of the humeral head with respect to the glenoid fossa. No definitive acute fracture is seen. IMPRESSION: Anterior shoulder dislocation. Electronically Signed   By: Jasmine Pang M.D.   On: 02/03/2024 22:05      Dione Booze, MD 02/04/24 [phone]

## 2024-02-03 NOTE — Progress Notes (Signed)
  Respiratory Therapist at [facility]  in room number _WA08_ during procedure.  Suction with [place] at [facility] set up and ready to use. Ambu bag at [facility] and ready to use.  Patient placed on ETCO2 Nasal Cannula at 2 LPM.  Vitals at conclusion of procedure are as noted below:  Patient awake and able to verbalize name before I Left the room.    02/03/24 2251  Therapy Vitals  Pulse Rate 78  Resp 16  BP (!) 187/116  MEWS Score/Color  MEWS Score 0  MEWS Score Color Green  Oxygen Therapy/Pulse Ox  O2 Device Nasal Cannula (EtCO2 monitoring [place])  O2 Therapy Oxygen  O2 Flow Rate (L/min) 2 L/min  SpO2 100 %  ETCO2 (mmHg) 34 mmHg

## 2024-02-03 NOTE — ED Provider Notes (Signed)
 [place] EMERGENCY DEPARTMENT AT [facility] Provider Note   CSN: [id number] Arrival date & time: 02/03/24  2042     History  No chief complaint on file.   [name] is a 83 y.o. male.  HPI 83 year old male presents after a fall.  He was trying to catch a piece of paper that was flying away from him and lost his balance and fell.  Injured his right knee causing an abrasion but that is pretty mild.  He is more concerned about his right shoulder.  No numbness or weakness in his extremities.  He is pretty sure his Tdap is up-to-date.  Home Medications Prior to Admission medications   Medication Sig Start Date End Date Taking? Authorizing Provider  ASTEPRO 205.5 MCG/SPRAY SOLN Place 2 sprays into both nostrils in the morning.    [provider]  buPROPion (WELLBUTRIN XL) 150 MG 24 hr tablet Take 450 mg by mouth every morning.    [provider]  fluticasone (FLONASE ALLERGY RELIEF) 50 MCG/ACT nasal spray Place 2 sprays into both nostrils every evening.    [provider]  furosemide (LASIX) 40 MG tablet TAKE 1 TABLET BY MOUTH EVERY MORNING AND AN ADDITIONAL TABLET DAILY AS NEEDED FOR UNRESOLVED SWELLING OR FLUID RETENTION 12/01/23   Runell Gess, MD  gabapentin (NEURONTIN) 300 MG capsule TAKE 1 TO 2 CAPSULES BY MOUTH AT BEDTIME Patient taking differently: Take 600 mg by mouth at bedtime. 01/15/17   Ofilia Neas, PA-C  meclizine (ANTIVERT) 25 MG tablet Take 1 tablet (25 mg total) by mouth 3 (three) times daily as needed for dizziness. 08/27/23   Dione Booze, MD  methocarbamol (ROBAXIN) 500 MG tablet Take 1 tablet (500 mg total) by mouth every 8 (eight) hours as needed for muscle spasms. Patient taking differently: Take 500 mg by mouth in the morning and at bedtime. 08/08/21   Haskel Schroeder, PA-C  mirtazapine (REMERON) 15 MG tablet Take 15 mg by mouth at bedtime.    [provider]  montelukast (SINGULAIR) 10 MG tablet Take  1 tablet (10 mg total) by mouth at bedtime. 04/10/23   Rai, Delene Ruffini, MD  Multiple Vitamin (MULTIVITAMIN WITH MINERALS) TABS tablet Take 1 tablet by mouth every morning. 12/30/23   Bjorn Pippin, MD  pantoprazole (PROTONIX) 40 MG tablet Take 1 tablet (40 mg total) by mouth 2 (two) times daily before a meal. 04/10/23   Rai, Ripudeep K, MD  potassium chloride SA (KLOR-CON M) 20 MEQ tablet Take 2 tablets (40 mEq total) by mouth 2 (two) times daily. 08/27/23   Dione Booze, MD  rosuvastatin (CRESTOR) 5 MG tablet Take 5 mg by mouth at bedtime. 12/20/18   [provider]  tamsulosin (FLOMAX) 0.4 MG CAPS capsule Take 0.4 mg by mouth daily.    [provider]      Allergies    Patient has no known allergies.    Review of Systems   Review of Systems  Musculoskeletal:  Positive for arthralgias.  Neurological:  Negative for weakness and numbness.    Physical Exam Updated Vital Signs BP (!) 187/116   Pulse 78   Temp 97.6 F (36.4 C) (Oral)   Resp 16   Ht 5\' 9"  (1.753 m)   Wt 111.1 kg   SpO2 100%   BMI 36.18 kg/m  Physical Exam Vitals and nursing note reviewed.  Constitutional:      Appearance: He is well-developed.  HENT:  Head: Normocephalic and atraumatic.  Cardiovascular:     Rate and Rhythm: Normal rate and regular rhythm.     Pulses:          Radial pulses are 2+ on the right side.       Posterior tibial pulses are 2+ on the right side.  Pulmonary:     Effort: Pulmonary effort is normal.  Abdominal:     General: There is no distension.  Musculoskeletal:     Right shoulder: Deformity and tenderness present. Decreased range of motion.     Right hip: Normal range of motion.     Right knee: Normal range of motion. Tenderness present.     Comments: Superficial right knee abrasion Normal right hand grip strength  Skin:    General: Skin is warm and dry.  Neurological:     Mental Status: He is alert.     ED Results / Procedures / Treatments   Labs (all  labs ordered are listed, but only abnormal results are displayed) Labs Reviewed  COMPREHENSIVE METABOLIC PANEL  CBC WITH DIFFERENTIAL/PLATELET  TROPONIN I (HIGH SENSITIVITY)    EKG None  Radiology DG Shoulder Right Portable Result Date: 02/03/2024 CLINICAL DATA:  reduction EXAM: RIGHT SHOULDER - 1 VIEW COMPARISON:  X-ray right shoulder 02/03/2024 9:45 p.m. FINDINGS: Interval reduction of the shoulder. There is no evidence of fracture or dislocation. Acromioclavicular joint degenerative changes. Soft tissues are unremarkable. IMPRESSION: No acute displaced fracture or dislocation. Electronically Signed   By: Tish Frederickson M.D.   On: 02/03/2024 23:18   DG Knee Complete 4 Views Right Result Date: 02/03/2024 CLINICAL DATA:  Fall laceration to patella EXAM: RIGHT KNEE - COMPLETE 4+ VIEW COMPARISON:  None Available. FINDINGS: No fracture or malalignment. Positive for knee effusion. Tricompartment arthritis of the knee with advanced medial tibiofemoral and patellofemoral degenerative change. IMPRESSION: No acute osseous abnormality. Knee effusion. Tricompartment arthritis. Electronically Signed   By: Jasmine Pang M.D.   On: 02/03/2024 22:08   DG Shoulder Right Result Date: 02/03/2024 CLINICAL DATA:  Fall EXAM: RIGHT SHOULDER - 2+ VIEW COMPARISON:  07/23/2018 FINDINGS: Anterior dislocation of the humeral head with respect to the glenoid fossa. No definitive acute fracture is seen. IMPRESSION: Anterior shoulder dislocation. Electronically Signed   By: Jasmine Pang M.D.   On: 02/03/2024 22:05    Procedures .Sedation  Date/Time: 02/03/2024 10:59 PM  Performed by: Pricilla Loveless, MD Authorized by: Pricilla Loveless, MD   Consent:    Consent obtained:  Verbal and written   Consent given by:  Patient Universal protocol:    Immediately prior to procedure, a time out was called: yes     Patient identity confirmed:  Verbally with patient Indications:    Procedure performed:  Dislocation  reduction   Procedure necessitating sedation performed by:  Physician performing sedation Pre-sedation assessment:    Time since last food or drink:  12 hours   NPO status caution: urgency dictates proceeding with non-ideal NPO status     ASA classification: class 3 - patient with severe systemic disease     Mallampati score:  I - soft palate, uvula, fauces, pillars visible   Pre-sedation assessments completed and reviewed: airway patency, cardiovascular function, hydration status, mental status, nausea/vomiting, pain level, respiratory function and temperature   A pre-sedation assessment was completed prior to the start of the procedure Immediate pre-procedure details:    Reassessment: Patient reassessed immediately prior to procedure     Reviewed: vital signs, relevant labs/tests and NPO  status     Verified: bag valve mask available, emergency equipment available, intubation equipment available, IV patency confirmed, oxygen available and suction available   Procedure details (see MAR for exact dosages):    Preoxygenation:  Nasal cannula   Sedation:  Propofol   Intended level of sedation: deep   Intra-procedure monitoring:  Blood pressure monitoring, cardiac monitor, continuous pulse oximetry, continuous capnometry, frequent LOC assessments and frequent vital sign checks   Intra-procedure events: none     Total Provider sedation time (minutes):  10 Post-procedure details:   A post-sedation assessment was completed following the completion of the procedure.   Attendance: Constant attendance by certified staff until patient recovered     Recovery: Patient returned to pre-procedure baseline     Patient is stable for discharge or admission: yes     Procedure completion:  Tolerated well, no immediate complications .Reduction of dislocation  Date/Time: 02/03/2024 11:00 PM  Performed by: Pricilla Loveless, MD Authorized by: Pricilla Loveless, MD  Consent: Verbal consent obtained. Written consent  obtained. Risks and benefits: risks, benefits and alternatives were discussed Time out: Immediately prior to procedure a "time out" was called to verify the correct patient, procedure, equipment, support staff and site/side marked as required. Local anesthesia used: no  Anesthesia: Local anesthesia used: no  Sedation: Patient sedated: yes  Patient tolerance: patient tolerated the procedure well with no immediate complications Comments: Right shoulder reduced with Kocher technique.       Medications Ordered in ED Medications  propofol (DIPRIVAN) 10 mg/mL bolus/IV push 55.6 mg (has no administration in time range)  HYDROcodone-acetaminophen (NORCO/VICODIN) 5-325 MG per tablet 1 tablet (has no administration in time range)  hydrALAZINE (APRESOLINE) injection 5 mg (has no administration in time range)  fentaNYL (SUBLIMAZE) injection 50 mcg (50 mcg Intravenous Given 02/03/24 2142)  lactated ringers bolus 500 mL (500 mLs Intravenous New Bag/Given 02/03/24 2300)    ED Course/ Medical Decision Making/ A&P                                 Medical Decision Making Amount and/or Complexity of Data Reviewed Labs: ordered. Radiology: ordered and independent interpretation performed.    Details: Right shoulder dislocation ECG/medicine tests: ordered.  Risk Prescription drug management.   Patient presents after a fall and a right knee abrasion as well as a right shoulder dislocation.  He was given some IV fentanyl for pain.  I did try to reduce without sedation but was difficult and so we decided to do sedation.  Was then easily reduced.  However he is then found to be quite hypertensive, not improving after treatments above.  He feels like he might be a little short of breath currently but denies any chest pain.  No headache or head injury.  He does have a history of some elevated blood pressures but currently is being watched without medications.  He does have a history of CKD.  Will  evaluate with labs, EKG, chest x-ray.  Will give some more pain control. Care transferred to Dr. Preston Fleeting.        Final Clinical Impression(s) / ED Diagnoses Final diagnoses:  Anterior dislocation of right shoulder, initial encounter    Rx / DC Orders ED Discharge Orders     None         Pricilla Loveless, MD 02/03/24 2333

## 2024-02-03 NOTE — ED Triage Notes (Signed)
 Patient BIB EMS for fall. Patient was walking in parking lot and fell when trying to catch a flying piece of paper with his cane. Went down on his R knee and has abrasion to knee, also hit his right shoulder on the vehicle. Complaining of pain in right knee. Denies hitting his head, denies LOC, and on no blood thinners.     VS stable per rescue  80 99% RA  18

## 2024-02-04 LAB — CBC WITH DIFFERENTIAL/PLATELET
Abs Immature Granulocytes: 0.05 10*3/uL (ref 0.00–0.07)
Basophils Absolute: 0 10*3/uL (ref 0.0–0.1)
Basophils Relative: 0 %
Eosinophils Absolute: 0.2 10*3/uL (ref 0.0–0.5)
Eosinophils Relative: 2 %
HCT: 37.9 % — ABNORMAL LOW (ref 39.0–52.0)
Hemoglobin: 12.5 g/dL — ABNORMAL LOW (ref 13.0–17.0)
Immature Granulocytes: 1 %
Lymphocytes Relative: 9 %
Lymphs Abs: 0.8 10*3/uL (ref 0.7–4.0)
MCH: 33.4 pg (ref 26.0–34.0)
MCHC: 33 g/dL (ref 30.0–36.0)
MCV: 101.3 fL — ABNORMAL HIGH (ref 80.0–100.0)
Monocytes Absolute: 0.7 10*3/uL (ref 0.1–1.0)
Monocytes Relative: 8 %
Neutro Abs: 7 10*3/uL (ref 1.7–7.7)
Neutrophils Relative %: 80 %
Platelets: 170 10*3/uL (ref 150–400)
RBC: 3.74 MIL/uL — ABNORMAL LOW (ref 4.22–5.81)
RDW: 12.3 % (ref 11.5–15.5)
WBC: 8.7 10*3/uL (ref 4.0–10.5)
nRBC: 0 % (ref 0.0–0.2)

## 2024-02-04 LAB — COMPREHENSIVE METABOLIC PANEL WITH GFR
ALT: 41 U/L (ref 0–44)
AST: 40 U/L (ref 15–41)
Albumin: 4 g/dL (ref 3.5–5.0)
Alkaline Phosphatase: 90 U/L (ref 38–126)
Anion gap: 8 (ref 5–15)
BUN: 27 mg/dL — ABNORMAL HIGH (ref 8–23)
CO2: 25 mmol/L (ref 22–32)
Calcium: 9.3 mg/dL (ref 8.9–10.3)
Chloride: 105 mmol/L (ref 98–111)
Creatinine, Ser: 2 mg/dL — ABNORMAL HIGH (ref 0.61–1.24)
GFR, Estimated: 33 mL/min — ABNORMAL LOW (ref >60)
Glucose, Bld: 118 mg/dL — ABNORMAL HIGH (ref 70–99)
Potassium: 3.7 mmol/L (ref 3.5–5.1)
Sodium: 138 mmol/L (ref 135–145)
Total Bilirubin: 0.7 mg/dL (ref 0.0–1.2)
Total Protein: 7.2 g/dL (ref 6.5–8.1)

## 2024-02-04 LAB — TROPONIN I (HIGH SENSITIVITY): Troponin I (High Sensitivity): 9 ng/L (ref <18)

## 2024-02-04 MED ORDER — PANTOPRAZOLE SODIUM 40 MG PO TBEC
40.0000 mg | DELAYED_RELEASE_TABLET | Freq: Two times a day (BID) | ORAL | Status: DC
  Administered 2024-02-04 (×2): 40 mg via ORAL
  Filled 2024-02-04 (×2): qty 1

## 2024-02-04 MED ORDER — MIRTAZAPINE 7.5 MG PO TABS
15.0000 mg | ORAL_TABLET | Freq: Every day | ORAL | Status: DC
  Administered 2024-02-04: 15 mg via ORAL
  Filled 2024-02-04: qty 2

## 2024-02-04 MED ORDER — PROPOFOL 10 MG/ML IV BOLUS
INTRAVENOUS | Status: AC | PRN
  Administered 2024-02-03: 50 mg via INTRAVENOUS

## 2024-02-04 MED ORDER — GABAPENTIN 300 MG PO CAPS
600.0000 mg | ORAL_CAPSULE | Freq: Every day | ORAL | Status: DC
  Administered 2024-02-04: 600 mg via ORAL
  Filled 2024-02-04: qty 2

## 2024-02-04 MED ORDER — FUROSEMIDE 40 MG PO TABS
40.0000 mg | ORAL_TABLET | Freq: Every day | ORAL | Status: DC
  Administered 2024-02-04: 40 mg via ORAL
  Filled 2024-02-04: qty 1

## 2024-02-04 MED ORDER — MENTHOL 3 MG MT LOZG: 1.0000 | LOZENGE | OROMUCOSAL | Status: DC | PRN

## 2024-02-04 MED ORDER — GUAIFENESIN ER 600 MG PO TB12
600.0000 mg | ORAL_TABLET | Freq: Once | ORAL | Status: AC
  Administered 2024-02-04: 600 mg via ORAL
  Filled 2024-02-04: qty 1

## 2024-02-04 MED ORDER — METHOCARBAMOL 500 MG PO TABS
500.0000 mg | ORAL_TABLET | Freq: Two times a day (BID) | ORAL | Status: DC
  Administered 2024-02-04 (×2): 500 mg via ORAL
  Filled 2024-02-04 (×2): qty 1

## 2024-02-04 MED ORDER — HYDROCODONE-ACETAMINOPHEN 5-325 MG PO TABS: 1.0000 | ORAL_TABLET | Freq: Four times a day (QID) | ORAL | 0 refills | Status: DC | PRN

## 2024-02-04 MED ORDER — IBUPROFEN 200 MG PO TABS
400.0000 mg | ORAL_TABLET | Freq: Once | ORAL | Status: AC
  Administered 2024-02-04: 400 mg via ORAL
  Filled 2024-02-04: qty 2

## 2024-02-04 MED ORDER — ROSUVASTATIN CALCIUM 5 MG PO TABS
5.0000 mg | ORAL_TABLET | Freq: Every day | ORAL | Status: DC
  Filled 2024-02-04: qty 1

## 2024-02-04 MED ORDER — AMLODIPINE BESYLATE 5 MG PO TABS
5.0000 mg | ORAL_TABLET | Freq: Every day | ORAL | Status: DC
  Administered 2024-02-04: 5 mg via ORAL
  Filled 2024-02-04: qty 1

## 2024-02-04 MED ORDER — BUPROPION HCL ER (XL) 150 MG PO TB24
450.0000 mg | ORAL_TABLET | Freq: Every morning | ORAL | Status: DC
  Administered 2024-02-04: 450 mg via ORAL
  Filled 2024-02-04: qty 3

## 2024-02-04 MED ORDER — HYDROCODONE-ACETAMINOPHEN 5-325 MG PO TABS
1.0000 | ORAL_TABLET | Freq: Four times a day (QID) | ORAL | Status: DC | PRN
  Administered 2024-02-04 (×2): 1 via ORAL
  Filled 2024-02-04 (×3): qty 1

## 2024-02-04 MED ORDER — TAMSULOSIN HCL 0.4 MG PO CAPS
0.4000 mg | ORAL_CAPSULE | Freq: Every day | ORAL | Status: DC
  Administered 2024-02-04: 0.4 mg via ORAL
  Filled 2024-02-04: qty 1

## 2024-02-04 NOTE — Evaluation (Addendum)
 Physical Therapy Evaluation Patient Details Name: [name] MRN: [id number] DOB: [birth year] Today's Date: 02/04/2024  History of Present Illness  Pt is an 83 y/o M admitted on 02/03/24 after presenting following a fall onto R knee & shoulder. Pt found to have R shoulder anterior dislocation that was reduced with sedation in the ED. PMH: obesity, OSA on CPAP, CKD 3B, asthma  Clinical Impression  Pt seen for PT evaluation with pt agreeable to tx. 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 On this date, pt with RUE sling donned, MD recommended minimal to no weight bearing or ROM (per secure chat) to prevent further dislocation; PT educated pt on this but pt with decreased recall & ability to adhere to during mobility. Pt is able to complete bed mobility with supervision, STS with min assist. Pt attempts to ambulate with SPC in LUE & min<>mod assist but relies on PT to prevent LOB 2/2 unsteadiness on feet. Pt asking about using a walker but PT educated him on inability at this time 2/2 precautions. PT educated pt on benefits of further therapy but pt stating he'd "like to go home & see if he can make it & if not, come back". Will continue to follow pt acutely to progress mobility as able to help reduce fall risk.      If plan is discharge home, recommend the following: A lot of help with walking and/or transfers;A lot of help with bathing/dressing/bathroom;Assist for transportation;Assistance with cooking/housework;Help with stairs or ramp for entrance   Can travel by private vehicle   No    Equipment Recommendations None recommended by PT  Recommendations for Other Services    OT consult   Functional Status Assessment Patient has had a recent decline in their functional status and demonstrates the ability to make significant improvements in function in a reasonable and predictable amount of time.      Precautions / Restrictions Precautions Precautions: Fall Recall of Precautions/Restrictions: Impaired Required Braces or Orthoses: Sling Restrictions Weight Bearing Restrictions Per Provider Order: No RUE Weight Bearing Per Provider Order: Non weight bearing      Mobility  Bed Mobility Overal bed mobility: Needs Assistance Bed Mobility: Supine to Sit     Supine to sit: Supervision, HOB elevated          Transfers Overall transfer level: Needs assistance Equipment used: Straight cane Transfers: Sit to/from Stand Sit to Stand: Min assist           General transfer comment: extra time to power up to standing    Ambulation/Gait Ambulation/Gait assistance: Min assist, Mod assist Gait Distance (Feet): 4 Feet Assistive device: Straight cane Gait Pattern/deviations: Decreased step length - left, Decreased stride length, Decreased step length - right, Decreased dorsiflexion - left, Decreased dorsiflexion - right, Wide base of support Gait velocity: decreased     General Gait Details: Pt takes a few steps forwards/backwards with SPC in LUE, pt reaching for tray table with RUE despite cuing to not weight bear/move R shoulder much. Pt with decreased balance; PT providing assistance througout to prevent fall.  Stairs            Wheelchair Mobility     Tilt Bed    Modified Rankin (Stroke Patients Only)       Balance Overall balance assessment: Needs assistance Sitting-balance support: Feet supported, No upper extremity supported Sitting balance-Leahy Scale: Fair Sitting balance -  Comments: supervision static sitting   Standing balance support: Single extremity supported, During functional activity Standing balance-Leahy Scale: Poor                               Pertinent Vitals/Pain Pain Assessment Pain Assessment: No/denies pain    Home Living Family/patient expects to be discharged to:: Other (Comment) Living Arrangements:  Alone Available Help at Discharge: Family;Friend(s);Available PRN/intermittently Type of Home:  (pt is sleeping/living in office of [employer]) Home Access: Stairs to enter Entrance Stairs-Rails: Right Entrance Stairs-Number of Steps: 2   Home Layout: One level Home Equipment: Cane - single point      Prior Function               Mobility Comments: Pt reports he's ambulatory with SPC, denies falls other than the one leading to admission, still driving. 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     Extremity/Trunk Assessment   Upper Extremity Assessment Upper Extremity Assessment: RUE deficits/detail RUE Deficits / Details: RUE in sling RUE: Unable to fully assess due to immobilization    Lower Extremity Assessment Lower Extremity Assessment: Generalized weakness (BLE unna boots)       Communication        Cognition Arousal: Alert Behavior During Therapy: WFL for tasks assessed/performed   PT - Cognitive impairments: Safety/Judgement                       PT - Cognition Comments: requires ongoing education/cuing re: minimal to no weight bearing in RUE to prevent further dislocation Following commands: Intact       Cueing       General Comments General comments (skin integrity, edema, etc.): BP in LUE 126/79 mmHg MAP 91 at beginning of session. Pt received on 2L/min, weaned to room air with SpO2 >/= 90%.    Exercises     Assessment/Plan    PT Assessment Patient needs continued PT services  PT Problem List Decreased strength;Cardiopulmonary status limiting activity;Decreased coordination;Decreased activity tolerance;Decreased balance;Decreased mobility;Decreased knowledge of precautions;Decreased safety awareness;Decreased knowledge of use of DME       PT  Treatment Interventions DME instruction;Balance training;Modalities;Gait training;Stair training;Functional mobility training;Therapeutic activities;Patient/family education;Neuromuscular re-education;Manual techniques;Therapeutic exercise    PT Goals (Current goals can be found in the Care Plan section)  Acute Rehab PT Goals Patient Stated Goal: go home & see if he can make it PT Goal Formulation: With patient Time For Goal Achievement: 02/18/24 Potential to Achieve Goals: Fair    Frequency Min 2X/week     Co-evaluation               AM-PAC PT "6 Clicks" Mobility  Outcome Measure Help needed turning from your back to your side while in a flat bed without using bedrails?: A Little Help needed moving from lying on your back to sitting on the side of a flat bed without using bedrails?: A Little Help needed moving to and from a bed to a chair (including a wheelchair)?: A Little Help needed standing up from a chair using your arms (e.g., wheelchair or bedside chair)?: A Little Help needed to walk in hospital room?: A Lot Help needed climbing 3-5 steps with a railing? : A  Lot 6 Click Score: 16    End of Session Equipment Utilized During Treatment:  (RUE sling) Activity Tolerance: Patient tolerated treatment well Patient left: with call bell/phone within reach (sitting on edge of ED stretcher) Nurse Communication: Mobility status (weaned to room air) PT Visit Diagnosis: Muscle weakness (generalized) (M62.81);Unsteadiness on feet (R26.81);Difficulty in walking, not elsewhere classified (R26.2);Other abnormalities of gait and mobility (R26.89)    Time: 1610-9604 PT Time Calculation (min) (ACUTE ONLY): 23 min   Charges:   PT Evaluation $PT Eval Low Complexity: 1 Low   PT General Charges $$ ACUTE PT VISIT: 1 Visit         Aleda Grana, PT, DPT 02/04/24, 11:18 AM   Sandi Mariscal 02/04/2024, 11:16 AM

## 2024-02-04 NOTE — Progress Notes (Signed)
Awaiting PT.  

## 2024-02-04 NOTE — ED Provider Notes (Addendum)
  Physical Exam  BP (!) 142/118   Pulse (!) 113   Temp 98.3 F (36.8 C) (Oral)   Resp 16   Ht 5\' 9"  (1.753 m)   Wt 111.1 kg   SpO2 97%   BMI 36.18 kg/m   Physical Exam  Procedures  Procedures  ED Course / MDM    Medical Decision Making Amount and/or Complexity of Data Reviewed Labs: ordered. Radiology: ordered.  Risk OTC drugs. Prescription drug management.   Patient with fall and shoulder dislocation.  Unable to manage at home now.  TOC consult has been placed.  Did have abrasion on right knee.  Will have wound cleansed.       Benjiman Core, MD 02/04/24 [phone]  Has been seen by PT.  Patient does not want skilled nursing placement.  Wants to go home.  Can follow-up with Dr. Luiz Blare.    Benjiman Core, MD 02/04/24 1451

## 2024-02-04 NOTE — Care Management (Addendum)
 Transition of Care [facility]) - Emergency Department Mini Assessment   Patient Details  Name: [name] MRN: [id number] Date of Birth: [birth year]  Transition of Care [facility]) CM/SW Contact:    Lavenia Atlas, RN Phone Number: 02/04/2024, 12:08 PM   Clinical Narrative:  Jayme Cloud consult for [facility] services. Per chart review patient currently at [facility] ED due to a fall as he attempted catch a piece of paper with his cane. This RNCM spoke with patient at bedside to offer [facility] choice. Patient declines HH and reports he prefers to continue to see Dr. Luiz Blare with [facility]. Patient reports his home is a loft with multiple antiques and doesn't  want HH to come into his home. Patient request lunch and to have his knee cleaned. This RNCM notified paramedic Gavin Pound who will continue to follow. Prior to admission patient has the following DME: cane and rollator walker. Patient inquired about the process if he decides to go to a SNF after he is discharged today. This RNCM explained to contact his PCP who can assist with short term SNF placement.      Transportation at discharge: patient will call friend Britta Mccreedy who will transport.  No additional TOC following.   ED Mini Assessment: What brought you to the Emergency Department? : recent fall  Barriers to Discharge: No Barriers Identified  Barrier interventions: coordinating home health services  Means of departure: Car  Interventions which prevented an admission or readmission: Home Health Consult or Services    Patient Contact and Communications        ,          Patient states their goals for this hospitalization and ongoing recovery are:: To return home with outpatient PT services. 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   Aspiration pneumonia (HCC) 04/07/2023   Asthma 04/07/2023   Acute respiratory failure with hypoxia (HCC) 11/18/2022   OSA (obstructive sleep apnea) 11/18/2022   Pulmonary nodules 11/18/2022   Blood in stool 11/18/2022   Influenza A with pneumonia 11/13/2022   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/2022   Primary osteoarthritis of left hip 04/23/2022   Osteomyelitis of great toe of right foot (HCC) 04/18/2022   Left hip pain 04/09/2022   Upper airway cough syndrome 02/03/2022   Acute rhinosinusitis 01/24/2022   Hypertension 09/30/2021   Allergic rhinitis due to pollen 03/25/2021   Benign prostatic hyperplasia with lower urinary tract symptoms 03/25/2021   Chronic fatigue syndrome 03/25/2021   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   Stage 3b chronic kidney disease (CKD) (HCC) 03/25/2021   Chronic pain 03/25/2021   Dyslipidemia 03/25/2021   Edema 03/25/2021   Generalized anxiety disorder 03/25/2021   Mild persistent asthma 03/25/2021   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   Obesity (BMI 30-39.9) 03/25/2021   Non-pressure chronic ulcer of other part of right foot limited to breakdown of skin (HCC) 03/25/2021   Prediabetes 03/25/2021   Primary insomnia 03/25/2021   Sleep disturbance 03/25/2021   Paresthesia of skin 06/08/2020   Right foot drop 06/08/2020   Body mass index (BMI) 35.0-35.9, adult 03/13/2020   Spondylosis of cervical region without myelopathy or radiculopathy 01/05/2020   Elevated blood-pressure reading, without diagnosis of hypertension 12/07/2019   Spinal stenosis of lumbar region with neurogenic claudication 01/15/2017  Spondylolisthesis of lumbar region 01/15/2017   Claw toe, acquired, left 01/06/2017   Achilles tendon contracture, bilateral 01/06/2017   Pain in metatarsus of both feet 01/06/2017   Tubular adenoma of colon 12/29/2016   Basal cell carcinoma 08/19/2016   Umbilical hernia s/p lap repair with mesh 06/27/2016 06/27/2016    Degenerative arthritis of knee 03/22/2015   Dyspnea on exertion 01/31/2015   Lumbar radiculopathy 12/12/2014   Facet hypertrophy of lumbar region 12/12/2014   Left knee pain 12/12/2014   Ulcer of lower limb (HCC) 01/10/2014   Varicose veins of bilateral lower extremities with other complications 01/10/2014   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 [id number] [place] - [address] [place] [address] Phone: [phone] Fax: [phone]  [id number] - [place], [place] - [address] AT [facility] [address] [place] [address] Phone: [phone] Fax: [phone]  [facility] [address] [place] [address] Phone: [phone] Fax: [phone]

## 2024-02-06 DIAGNOSIS — M67911 Unspecified disorder of synovium and tendon, right shoulder: Secondary | ICD-10-CM | POA: Diagnosis not present

## 2024-02-06 DIAGNOSIS — M25551 Pain in right hip: Secondary | ICD-10-CM | POA: Diagnosis not present

## 2024-02-06 DIAGNOSIS — M25561 Pain in right knee: Secondary | ICD-10-CM | POA: Diagnosis not present

## 2024-02-08 ENCOUNTER — Telehealth: Payer: Self-pay | Admitting: *Deleted

## 2024-02-08 NOTE — Telephone Encounter (Signed)
 Pt has been scheduled to see Dr. Allyson Sabal 03/01/24, clearance will be addressed at that time.  Will route to the requesting surgeon's office to make them aware.

## 2024-02-08 NOTE — Telephone Encounter (Signed)
   Pre-operative Risk Assessment    Patient Name: [name]  DOB: 1941-03-10 MRN: [id number]   Date of last office visit: 08/25/2023 Date of next office visit: None  Request for Surgical Clearance    Procedure:   Right Total Hip Arthroplasty.  Date of Surgery:  Clearance TBD                                 Surgeon:  Dr. Milly Jakob Surgeon's Group or Practice Name:  [facility] Phone number:  [phone] Fax number:  [phone]   Type of Clearance Requested:   - Medical    Type of Anesthesia:  Spinal   Additional requests/questions:    Signed, Emmit Pomfret   02/08/2024, 12:30 PM

## 2024-02-08 NOTE — Telephone Encounter (Signed)
   Name: YAFET CLINE  DOB: 03-16-1941  MRN: [id number]  Primary Cardiologist: Nanetta Batty, MD  Chart reviewed as part of pre-operative protocol coverage. Because of Makail Watling Rayman's past medical history and time since last visit, he will require a follow-up in-office visit in order to better assess preoperative cardiovascular risk.  Pre-op covering staff: - Please schedule appointment and call patient to inform them. If patient already had an upcoming appointment within acceptable timeframe, please add "pre-op clearance" to the appointment notes so provider is aware. - Please contact requesting surgeon's office via preferred method (i.e, phone, fax) to inform them of need for appointment prior to surgery.  No medication indicated as needing held.  Sharlene Dory, PA-C  02/08/2024, 1:26 PM

## 2024-02-10 ENCOUNTER — Telehealth: Payer: Self-pay | Admitting: Pulmonary Disease

## 2024-02-10 NOTE — Telephone Encounter (Signed)
 Fax received from Dr. Milly Jakob with [facility] to perform a right total hip arthroplasty under spinal anesthesia on patient.  Patient needs surgery clearance. Surgery is TBD. Patient was seen on 01/02/23. Office protocol is a risk assessment can be sent to surgeon if patient has been seen in 60 days or less.   Pt will need visit here to discuss risk assessment    I called the pt and there was no answer- LMTCB

## 2024-02-15 ENCOUNTER — Ambulatory Visit (INDEPENDENT_AMBULATORY_CARE_PROVIDER_SITE_OTHER): Admitting: Podiatry

## 2024-02-15 ENCOUNTER — Ambulatory Visit: Admitting: Podiatry

## 2024-02-15 DIAGNOSIS — I872 Venous insufficiency (chronic) (peripheral): Secondary | ICD-10-CM

## 2024-02-15 DIAGNOSIS — B351 Tinea unguium: Secondary | ICD-10-CM

## 2024-02-15 DIAGNOSIS — M79674 Pain in right toe(s): Secondary | ICD-10-CM

## 2024-02-15 DIAGNOSIS — M79675 Pain in left toe(s): Secondary | ICD-10-CM | POA: Diagnosis not present

## 2024-02-15 DIAGNOSIS — L84 Corns and callosities: Secondary | ICD-10-CM

## 2024-02-16 NOTE — Telephone Encounter (Signed)
 Spoke with the pt and scheduled for next available appt here 04/02/24

## 2024-02-17 DIAGNOSIS — N130 Hydronephrosis with ureteropelvic junction obstruction: Secondary | ICD-10-CM | POA: Diagnosis not present

## 2024-02-17 DIAGNOSIS — N200 Calculus of kidney: Secondary | ICD-10-CM | POA: Diagnosis not present

## 2024-02-17 DIAGNOSIS — R3914 Feeling of incomplete bladder emptying: Secondary | ICD-10-CM | POA: Diagnosis not present

## 2024-02-17 DIAGNOSIS — N401 Enlarged prostate with lower urinary tract symptoms: Secondary | ICD-10-CM | POA: Diagnosis not present

## 2024-02-17 NOTE — Progress Notes (Signed)
 Subjective: No chief complaint on file.  83 year old male, who is seen several providers in our office, presents the office today with concerns of needing dressing change.  He has been in the boot on both legs to help with swelling.  Patient had a wound plantar aspect left foot.  Since he is at in the facility has not been able to visualize to see this any other wounds.  Also needs his nails trimmed dystrophic and elongated and he cannot trim them himself.    Objective: AAO x3, NAD DP/PT pulses palpable bilaterally, CRT less than 3 seconds Unna boots are present bilaterally which are removed.  Unable to appreciate any open lesions bilaterally.  The plantar aspect the left midfoot is a thick hyperkeratotic lesion upon debridement.  It is preulcerative but there is no skin breakdown.  There is no drainage or pus or signs of infection. Nails are hypertrophic, dystrophic, brittle, discolored, elongated 10. No surrounding redness or drainage. Tenderness nails 1-5 bilaterally. No open lesions. No pain with calf compression, swelling, warmth, erythema  Assessment: 83 year old male with venous insufficiency bilaterally preulcerative callus left foot, onychomycosis  Plan: Symptomatic onychomycosis -Sharply debrided nails x 10 without any complications or bleeding  Preulcerative callus left foot -Sharply debrided the hyperkeratotic lesion x 1 without any complications or bleeding  Venous insufficiency -Inhibitors were applied bilaterally.  Precautions were possible to remove these. -Patient encouraged to call the office with any questions, concerns, change in symptoms.   Return in about 1 week (around 02/22/2024) for unna boot change.  Vivi Barrack DPM

## 2024-02-18 DIAGNOSIS — K219 Gastro-esophageal reflux disease without esophagitis: Secondary | ICD-10-CM | POA: Diagnosis not present

## 2024-02-18 DIAGNOSIS — J4520 Mild intermittent asthma, uncomplicated: Secondary | ICD-10-CM | POA: Diagnosis not present

## 2024-02-18 DIAGNOSIS — Z6841 Body Mass Index (BMI) 40.0 and over, adult: Secondary | ICD-10-CM | POA: Diagnosis not present

## 2024-02-18 DIAGNOSIS — I10 Essential (primary) hypertension: Secondary | ICD-10-CM | POA: Diagnosis not present

## 2024-02-18 DIAGNOSIS — R7303 Prediabetes: Secondary | ICD-10-CM | POA: Diagnosis not present

## 2024-02-18 DIAGNOSIS — E782 Mixed hyperlipidemia: Secondary | ICD-10-CM | POA: Diagnosis not present

## 2024-02-18 DIAGNOSIS — Z789 Other specified health status: Secondary | ICD-10-CM | POA: Diagnosis not present

## 2024-02-18 DIAGNOSIS — M1611 Unilateral primary osteoarthritis, right hip: Secondary | ICD-10-CM | POA: Diagnosis not present

## 2024-02-18 DIAGNOSIS — N1832 Chronic kidney disease, stage 3b: Secondary | ICD-10-CM | POA: Diagnosis not present

## 2024-02-18 DIAGNOSIS — G4733 Obstructive sleep apnea (adult) (pediatric): Secondary | ICD-10-CM | POA: Diagnosis not present

## 2024-02-22 ENCOUNTER — Telehealth: Payer: Self-pay

## 2024-02-22 DIAGNOSIS — Z789 Other specified health status: Secondary | ICD-10-CM

## 2024-02-22 NOTE — Patient Outreach (Signed)
 Submitted faxed referral for assistance.  [facility]  Population Health Care Management Assistant  Direct Dial: [phone]  Fax: [phone] Website: [email or web address]

## 2024-02-23 ENCOUNTER — Telehealth: Payer: Self-pay | Admitting: *Deleted

## 2024-02-23 DIAGNOSIS — M25511 Pain in right shoulder: Secondary | ICD-10-CM | POA: Diagnosis not present

## 2024-02-23 NOTE — Progress Notes (Signed)
 Complex Care Management Note Care Guide Note  02/23/2024 Name: [name] MRN: [id number] DOB: Jun 08, 1941   Complex Care Management Outreach Attempts: An unsuccessful telephone outreach was attempted today to offer the patient information about available complex care management services.  Follow Up Plan:  Additional outreach attempts will be made to offer the patient complex care management information and services.   Encounter Outcome:  No Answer  Barnie Bora  [facility]  [facility], [facility] Guide  Direct Dial: [phone]  Fax [phone]

## 2024-03-01 ENCOUNTER — Ambulatory Visit: Attending: Cardiovascular Disease | Admitting: Cardiovascular Disease

## 2024-03-01 ENCOUNTER — Telehealth: Payer: Self-pay | Admitting: Physician Assistant

## 2024-03-01 DIAGNOSIS — R195 Other fecal abnormalities: Secondary | ICD-10-CM

## 2024-03-01 NOTE — Progress Notes (Signed)
 Complex Care Management Note Care Guide Note  03/01/2024 Name: [name] MRN: [id number] DOB: 07/07/1941   Complex Care Management Outreach Attempts: A second unsuccessful outreach was attempted today to offer the patient with information about available complex care management services.  Follow Up Plan:  Additional outreach attempts will be made to offer the patient complex care management information and services.   Encounter Outcome:  No Answer  Barnie Bora  [facility]  [facility], [facility] Guide  Direct Dial: [phone]  Fax [phone]

## 2024-03-01 NOTE — Progress Notes (Signed)
 Complex Care Management Note  Care Guide Note 03/01/2024 Name: [name] MRN: [id number] DOB: 08-01-1941  [name] is a 83 y.o. year old male who sees Helyn Lobstein, MD for primary care. I reached out to Delorse Fey by phone today to offer complex care management services.  [name] was given information about Complex Care Management services today including:   The Complex Care Management services include support from the care team which includes your Nurse Care Manager, Clinical Social Worker, or Pharmacist.  The Complex Care Management team is here to help remove barriers to the health concerns and goals most important to you. Complex Care Management services are voluntary, and the patient may decline or stop services at any time by request to their care team member.   Complex Care Management Consent Status: Patient agreed to services and verbal consent obtained.   Follow up plan:  Telephone appointment with complex care management team member scheduled for:  4/28  Encounter Outcome:  Patient Scheduled Patient declined for SW appointment at this time. RNCM aware.   Barnie Bora  [facility]  [facility], [facility] Guide  Direct Dial: [phone]  Fax [phone]

## 2024-03-01 NOTE — Telephone Encounter (Signed)
 Patient calls with concerns that he has had some mild burning in the rectum over the past few days with feeling that he needs to move his bowels, but states that nothing came out. Yesterday, patient describes an extremely large, formed bowel movement that was very dark/almost black and this concerned him that he may have blood in his stool. No overt BRBPR. Denies abdominal pain. Denies issues with constipation typically. Patient was advised in 10/2023 to take miralax  daily for constipation but states he did not do this as his bowels started working fine. He does not take any fiber supplementation either. He denies any chest pain or upper GI symptoms. Says he does get dizzy intermittently but this has been ongoing and occurred prior to his current GI complaints.   Of note: patient takes robaxin  500 mg twice daily.  I discussed fiber supplementation with the patient and suggested the addition of Benefiber daily. Discussed increasing fluid intake (patient is on diuretics).  Any additional recommendations?

## 2024-03-01 NOTE — Telephone Encounter (Signed)
 If he is concerned he passed blood can do a CBC to make sure Hgb stable, although with formed stool that would perhaps be less likely. Agree with fiber supplementation or Miralax  if having hard stools otherwise. If he has recurrence of stools concerning for bleeding he should let us  know, otherwise can check CBC if he was concerned about that possibility.

## 2024-03-01 NOTE — Telephone Encounter (Signed)
 Patient called and stated that he is currently having some problem but did not want to give further information. Patient is requesting that nurse give him a call back. Please advise.

## 2024-03-01 NOTE — Telephone Encounter (Signed)
 Advised patient of recommendations as per Dr General Kenner. Patient verbalizes understanding and will come for labs today or tomorrow.

## 2024-03-02 ENCOUNTER — Encounter: Payer: Self-pay | Admitting: Cardiovascular Disease

## 2024-03-02 ENCOUNTER — Telehealth: Payer: Self-pay

## 2024-03-02 NOTE — Telephone Encounter (Signed)
 Patient was transferred to the nurse line-  he needs an appointment for wound care /ulcer follow up and unna boot change - please call back to schedule

## 2024-03-03 ENCOUNTER — Encounter: Payer: Self-pay | Admitting: Pulmonary Disease

## 2024-03-03 ENCOUNTER — Ambulatory Visit: Admitting: Pulmonary Disease

## 2024-03-03 ENCOUNTER — Ambulatory Visit (INDEPENDENT_AMBULATORY_CARE_PROVIDER_SITE_OTHER): Admitting: Podiatry

## 2024-03-03 ENCOUNTER — Encounter: Payer: Self-pay | Admitting: Podiatry

## 2024-03-03 ENCOUNTER — Encounter: Payer: Self-pay | Admitting: Radiology

## 2024-03-03 DIAGNOSIS — L84 Corns and callosities: Secondary | ICD-10-CM | POA: Diagnosis not present

## 2024-03-03 DIAGNOSIS — I872 Venous insufficiency (chronic) (peripheral): Secondary | ICD-10-CM | POA: Diagnosis not present

## 2024-03-07 ENCOUNTER — Telehealth: Payer: Self-pay

## 2024-03-08 NOTE — Progress Notes (Signed)
 Subjective: Chief Complaint  Patient presents with   Wound Check    RM#11 Wound check     83 year old male, presents today requesting new wound boots.  Has not had been changed.  He does not report any fevers or chills or any recent injuries or open sores.    Objective: AAO x3, NAD DP/PT pulses palpable bilaterally, CRT less than 3 seconds Unna boots are present bilaterally which are removed.  They were quite dirty today.  Upon removal there is no open lesions.  There is a hyperkeratotic lesion left foot submetatarsal midfoot but there is no underlying ulceration, drainage or signs of infection noted today.  No open lesions bilaterally. No pain with calf compression, swelling, warmth, erythema  Assessment: 83 year old male with venous insufficiency bilaterally preulcerative callus left foot  Plan:  Preulcerative callus left foot -Sharply debrided the hyperkeratotic lesion x 1 without any complications or bleeding as a courtesy.  Venous insufficiency -Inhibitors were applied bilaterally.  Precautions were possible to remove these. -Patient encouraged to call the office with any questions, concerns, change in symptoms.   No follow-ups on file.  Charity Conch DPM

## 2024-03-10 ENCOUNTER — Ambulatory Visit: Admitting: Adult Health

## 2024-03-10 ENCOUNTER — Telehealth: Payer: Self-pay | Admitting: *Deleted

## 2024-03-10 NOTE — Telephone Encounter (Signed)
 Copied from CRM [id number]. Topic: Appointments - Scheduling Inquiry for Clinic >> Mar 08, 2024  2:16 PM Whitney O wrote: Reason for CRM: PATIENT Is calling cause he is needing a appointment this week or next week for surgery clearance . Patient had a appointment with Dr Waylan Haggard but it was a no show. Patient is needing this appointment asap. I didn't see anything till July for Dr Waylan Haggard then other providers with the same location started at the end of may . Please reach to patient to get him scheduled asap . Patient is having surgery within the next few weeks and need this appointment to be clear for surgery  [phone]  Called pt and scheduled appt with Roena Clark, NP for today at 4 pm

## 2024-03-11 ENCOUNTER — Encounter: Payer: Self-pay | Admitting: Podiatry

## 2024-03-11 ENCOUNTER — Ambulatory Visit (INDEPENDENT_AMBULATORY_CARE_PROVIDER_SITE_OTHER): Admitting: Podiatry

## 2024-03-11 DIAGNOSIS — I872 Venous insufficiency (chronic) (peripheral): Secondary | ICD-10-CM | POA: Diagnosis not present

## 2024-03-11 DIAGNOSIS — L97911 Non-pressure chronic ulcer of unspecified part of right lower leg limited to breakdown of skin: Secondary | ICD-10-CM | POA: Diagnosis not present

## 2024-03-14 ENCOUNTER — Ambulatory Visit: Admitting: Nurse Practitioner

## 2024-03-14 NOTE — Progress Notes (Signed)
 Subjective: Chief Complaint  Patient presents with   Wound Check    RM#12 needs an appointment for wound care /ulcer follow up and unna boot chang (req's weekly change)    83 year old male, presents today for Unna boots.  He states that he has compression socks but they do not do as well.  Does not report any open lesions.    Objective: AAO x3, NAD DP/PT pulses palpable bilaterally, CRT less than 3 seconds There is no open lesions at the plantar aspects of the feet today or preulcerative lesions.  Superficial area of skin breakdown noted on the right leg without any drainage or pus.  There is no fluctuation or crepitation.  Is no ascending cellulitis. No pain with calf compression, swelling, warmth, erythema  Assessment: 83 year old male with venous insufficiency superficial skin breakdown right leg  Plan:  Venous insufficiency - I cleaned the legs today with Hibiclens  as they were dirty.  Unna boots applied bilaterally.  Precautions were possible to remove these.  Discussed with him that once he is more stable getting back to wear compression socks.  Encouraged elevation. -Patient encouraged to call the office with any questions, concerns, change in symptoms.   Return in about 1 week (around 03/18/2024).  Unna boots  Charity Conch DPM

## 2024-03-15 ENCOUNTER — Telehealth: Payer: Self-pay | Admitting: *Deleted

## 2024-03-15 DIAGNOSIS — M1711 Unilateral primary osteoarthritis, right knee: Secondary | ICD-10-CM | POA: Diagnosis not present

## 2024-03-15 DIAGNOSIS — M75121 Complete rotator cuff tear or rupture of right shoulder, not specified as traumatic: Secondary | ICD-10-CM | POA: Diagnosis not present

## 2024-03-15 NOTE — Progress Notes (Signed)
 Complex Care Management Care Guide Note  03/15/2024 Name: [name] MRN: [id number] DOB: 10/14/1941  [name] is a 83 y.o. year old male who is a primary care patient of Helyn Lobstein, MD and is actively engaged with the care management team. I reached out to Delorse Fey by phone today to assist with re-scheduling  with the RN Case Manager.  Follow up plan: Unsuccessful telephone outreach attempt made. A HIPAA compliant phone message was left for the patient providing contact information and requesting a return call.  Barnie Bora  [facility]  Value[facility]Based Care Institute, [facility] Guide  Direct Dial: [phone]  Fax [phone]

## 2024-03-17 ENCOUNTER — Ambulatory Visit: Admitting: Podiatry

## 2024-03-22 NOTE — Progress Notes (Signed)
 Complex Care Management Care Guide Note  03/22/2024 Name: [name] MRN: [id number] DOB: Mar 29, 1941  [name] is a 83 y.o. year old male who is a primary care patient of Helyn Lobstein, MD and is actively engaged with the care management team. I reached out to Delorse Fey by phone today to assist with re-scheduling  with the RN Case Manager.  Follow up plan: Unsuccessful telephone outreach attempt made. A HIPAA compliant phone message was left for the patient providing contact information and requesting a return call. No further outreach attempts will be made at this time. We have been unable to contact the patient to reschedule for complex care management services.   Barnie Bora  [facility]  Value-Based Care Institute, [facility] Guide  Direct Dial: [phone]  Fax [phone]

## 2024-03-23 NOTE — Progress Notes (Unsigned)
 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 male with a PMH of HTN, HLD, intermittent asthma, CKD stage III, prediabetes, OSA (on CPAP), GERD who presents today for preoperative clearance.  [name] has been followed by Dr. Katheryne Pane since 2016 for management of chest pain and dyspnea on exertion.  He underwent an LHC for further evaluation in 2008 that showed normal coronaries.  Nuclear stress test in 2018 that showed EF of 45-50% with mild diffuse hypokinesis and no ischemia.  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  He was seen in the ED on 11/2021 due to elevated blood pressure in the setting of using Afrin.  D-dimer was elevated and CTA was negative for PE.  He suffered an asthma exacerbation in 01/2022 that  required steroid therapy and doxycycline .  He was seen in the ED on 04/09/2023 with complaint of DOE and leg edema.  He was noted to have normal BNP of 13.7 with 9-10 and COVID test was negative.  CTA of the chest was obtained was negative for PE.  He was evaluated by pulmonology services for concerns of chronic intermittent aspiration due to significant acid reflux and tracheobronchial malacia.  He was placed on IV Lasix  creatinine trended up to 2.0 symptoms felt to be related to hypoxemia and dizziness due to pulmonary issues.  He was transition to home dose of Lasix  prior to discharge.  Lexiscan .  He was last seen in the office on 08/25/2023 for follow-up.  He was seen in his PCP on 03/2023 with chest pain and dizziness with elevated troponin.  He refused to go to the ED and appointment to follow-up.  Lexiscan  Myoview  on 08/30/2023 that was normal patient is to follow-up with GI.  Event monitor for evaluation of dizziness is overall stable dominantly sinus rhythm.   He was admitted to the ED on 06/27/2024 with ureteral stone laser stent placement.   Patient denies chest pain, palpitations, dyspnea, PND, orthopnea, nausea, vomiting, dizziness,  syncope, edema, weight gain, or early satiety.   Discussed the use of AI scribe software for clinical note transcription with the patient, who gave verbal consent to proceed.  History of Present Illness    ***Notes:   Review of Systems  Please see the history of present illness.    All other systems reviewed and are otherwise negative except as noted above.  Physical Exam     Wt Readings from Last 3 Encounters:  02/03/24 245 lb (111.1 kg)  12/29/23 255 lb (115.7 kg)  10/22/23 256 lb (116.1 kg)   JY:NWGNF were no vitals filed for this visit.,There is no height or weight on file to calculate BMI. GEN: Well nourished, well developed in no acute distress Neck: No JVD; No carotid bruits Pulmonary: Clear to auscultation without rales, wheezing or  rhonchi  Cardiovascular: Normal rate. Regular rhythm. Normal S1. Normal S2.   Murmurs: There is no murmur.  ABDOMEN: Soft, non-tender, non-distended EXTREMITIES:  No edema; No deformity   EKG/LABS/ Recent Cardiac Studies   ECG personally reviewed by me today - ***  Risk Assessment/Calculations:   {Does this patient have ATRIAL FIBRILLATION?:[phone]}      Lab Results  Component Value Date   WBC 8.7 02/03/2024   HGB 12.5 (L) 02/03/2024   HCT 37.9 (L) 02/03/2024   MCV 101.3 (H) 02/03/2024   PLT 170 02/03/2024   Lab Results  Component Value Date   CREATININE 2.00 (H) 02/03/2024   BUN 27 (H) 02/03/2024   NA 138 02/03/2024   K 3.7 02/03/2024   CL 105 02/03/2024   CO2 25 02/03/2024   Lab Results  Component Value Date   CHOL 175 03/11/2016   HDL 38 (L) 03/11/2016   LDLCALC 97 03/11/2016   TRIG 199 (H) 03/11/2016   CHOLHDL 4.6 03/11/2016    Lab Results  Component Value Date   HGBA1C 5.7 (H) 04/16/2022   Assessment & Plan    Assessment and Plan Assessment & Plan     1.  Preoperative clearance: - Patient's RCRI score is 0.9%  2.  Hyperlipidemia  3.  Nonobstructive  4.  History of OSA  5.  Essential hypertension      Disposition: Follow-up with Lauro Portal, MD or APP in *** months {Are you ordering a CV Procedure (e.g. stress test, cath, DCCV, TEE, etc)?   Press F2        :[id number]}   Signed, Francene Ing, Retha Cast, NP 03/23/2024, 7:27 AM [facility]

## 2024-03-24 ENCOUNTER — Telehealth: Payer: Self-pay | Admitting: Cardiovascular Disease

## 2024-03-24 ENCOUNTER — Ambulatory Visit: Attending: Nurse Practitioner | Admitting: Nurse Practitioner

## 2024-03-24 DIAGNOSIS — I2510 Atherosclerotic heart disease of native coronary artery without angina pectoris: Secondary | ICD-10-CM

## 2024-03-24 DIAGNOSIS — I10 Essential (primary) hypertension: Secondary | ICD-10-CM

## 2024-03-24 DIAGNOSIS — G4733 Obstructive sleep apnea (adult) (pediatric): Secondary | ICD-10-CM

## 2024-03-24 DIAGNOSIS — Z01810 Encounter for preprocedural cardiovascular examination: Secondary | ICD-10-CM

## 2024-03-24 DIAGNOSIS — E782 Mixed hyperlipidemia: Secondary | ICD-10-CM

## 2024-03-24 NOTE — Telephone Encounter (Signed)
 Caller Marily Shows) is following-up on status of patient's clearance for surgery.

## 2024-03-24 NOTE — Telephone Encounter (Signed)
 I will send a message to [name], NP who is seeing pt today for preop clearance.

## 2024-03-24 NOTE — Telephone Encounter (Signed)
 Thank you [name], I will update the requesting office the pt will need to call back and reschedule appt in office for preop clearance.

## 2024-03-24 NOTE — Telephone Encounter (Signed)
 Thank you [name], I will update the requesting office the pt will need to call back and reschedule appt in office for preop clearance.       Note   [name], NP  You12 minutes ago (12:18 PM)    [name],  [name] was a no-show for his visit today.   You routed conversation to [name], NP1 hour ago (10:59 AM)   You1 hour ago (10:59 AM)    I will send a message to [name], NP who is seeing pt today for preop clearance.       Note   [name] routed conversation to [employer] hour ago (10:44 AM)   [name], [name]1 hour ago (10:44 AM)   [name] is following-up on status of patient's clearance for surgery.      Note   [name] [phone]  [name], [name]

## 2024-03-30 DIAGNOSIS — L739 Follicular disorder, unspecified: Secondary | ICD-10-CM | POA: Diagnosis not present

## 2024-03-30 DIAGNOSIS — R233 Spontaneous ecchymoses: Secondary | ICD-10-CM | POA: Diagnosis not present

## 2024-03-30 DIAGNOSIS — D225 Melanocytic nevi of trunk: Secondary | ICD-10-CM | POA: Diagnosis not present

## 2024-03-30 DIAGNOSIS — L570 Actinic keratosis: Secondary | ICD-10-CM | POA: Diagnosis not present

## 2024-03-30 DIAGNOSIS — L821 Other seborrheic keratosis: Secondary | ICD-10-CM | POA: Diagnosis not present

## 2024-03-30 DIAGNOSIS — D492 Neoplasm of unspecified behavior of bone, soft tissue, and skin: Secondary | ICD-10-CM | POA: Diagnosis not present

## 2024-03-30 DIAGNOSIS — D0461 Carcinoma in situ of skin of right upper limb, including shoulder: Secondary | ICD-10-CM | POA: Diagnosis not present

## 2024-03-30 DIAGNOSIS — L814 Other melanin hyperpigmentation: Secondary | ICD-10-CM | POA: Diagnosis not present

## 2024-04-01 ENCOUNTER — Encounter: Payer: Self-pay | Admitting: *Deleted

## 2024-04-07 ENCOUNTER — Ambulatory Visit: Admitting: Podiatry

## 2024-04-07 DIAGNOSIS — I872 Venous insufficiency (chronic) (peripheral): Secondary | ICD-10-CM

## 2024-04-07 DIAGNOSIS — D1801 Hemangioma of skin and subcutaneous tissue: Secondary | ICD-10-CM | POA: Diagnosis not present

## 2024-04-07 DIAGNOSIS — Z48817 Encounter for surgical aftercare following surgery on the skin and subcutaneous tissue: Secondary | ICD-10-CM | POA: Diagnosis not present

## 2024-04-07 DIAGNOSIS — D225 Melanocytic nevi of trunk: Secondary | ICD-10-CM | POA: Diagnosis not present

## 2024-04-07 DIAGNOSIS — L239 Allergic contact dermatitis, unspecified cause: Secondary | ICD-10-CM | POA: Diagnosis not present

## 2024-04-07 DIAGNOSIS — L821 Other seborrheic keratosis: Secondary | ICD-10-CM | POA: Diagnosis not present

## 2024-04-07 DIAGNOSIS — L814 Other melanin hyperpigmentation: Secondary | ICD-10-CM | POA: Diagnosis not present

## 2024-04-08 NOTE — Progress Notes (Signed)
 Subjective: Chief Complaint  Patient presents with   Follow-up     83 year old male, presents as a walk-in appointment for [employer].  Unfortunately he missed his last appointment due to shoulder issues.   Objective: AAO x3, NAD DP/PT pulses palpable bilaterally, CRT less than 3 seconds There are no lesions noted.  The feet were dirty.  There is no erythema or warmth. No pain with calf compression, swelling, warmth, erythema  Assessment: 83 year old male with venous insufficiency, history of ulcer  Plan:  Venous insufficiency - We cleaned the legs today with Hibiclens  as they were dirty.  Unna boots applied bilaterally.  Precautions were possible to remove these.  Discussed with him that once he is more stable getting back to wear compression socks.  Encouraged elevation. -Patient encouraged to call the office with any questions, concerns, change in symptoms.   No follow-ups on file.  Charity Conch DPM

## 2024-04-11 ENCOUNTER — Telehealth: Payer: Self-pay | Admitting: Physical Medicine and Rehabilitation

## 2024-04-11 NOTE — Telephone Encounter (Signed)
 Pt called requesting a appt for back injection. Pt phone number is [phone]. Last injection 09/10/23

## 2024-04-12 ENCOUNTER — Telehealth: Payer: Self-pay

## 2024-04-12 NOTE — Telephone Encounter (Signed)
 October 2024 % 90 Relief /function ability Duration of Relief/ Improvement---8 months Current pain score--9 Recent falls or Injuries--None Location of Pain--Lower back radiating bilaterally hips. Same Pain as last time.Aaron Aas

## 2024-04-14 ENCOUNTER — Ambulatory Visit: Admitting: Podiatry

## 2024-04-15 ENCOUNTER — Other Ambulatory Visit: Payer: Self-pay | Admitting: Physical Medicine and Rehabilitation

## 2024-04-15 DIAGNOSIS — M5416 Radiculopathy, lumbar region: Secondary | ICD-10-CM

## 2024-04-15 DIAGNOSIS — M48062 Spinal stenosis, lumbar region with neurogenic claudication: Secondary | ICD-10-CM

## 2024-04-26 ENCOUNTER — Telehealth: Payer: Self-pay

## 2024-04-26 NOTE — Telephone Encounter (Signed)
 Patient calling checking status on injection appointment

## 2024-05-06 ENCOUNTER — Telehealth: Payer: Self-pay | Admitting: Podiatry

## 2024-05-06 NOTE — Telephone Encounter (Signed)
 Patient called into the office requesting to come in later this afternoon for unna boot change. Notified patient that the office is only open a half day on Fridays. Patient requesting a call from Dr. Gershon as [place] is going [place] Monday afternoon and wants to be worked in Monday morning or see what needs to be done. Thanks

## 2024-05-06 NOTE — Telephone Encounter (Signed)
 Spoke to patient will be here Monday at 11:00am.

## 2024-05-09 ENCOUNTER — Ambulatory Visit (INDEPENDENT_AMBULATORY_CARE_PROVIDER_SITE_OTHER): Admitting: Podiatry

## 2024-05-09 VITALS — BP 136/62 | HR 104 | Resp 18

## 2024-05-09 DIAGNOSIS — I872 Venous insufficiency (chronic) (peripheral): Secondary | ICD-10-CM

## 2024-05-09 DIAGNOSIS — R2681 Unsteadiness on feet: Secondary | ICD-10-CM

## 2024-05-09 NOTE — Progress Notes (Signed)
 Subjective: Chief Complaint  Patient presents with   Venous Insufficiency   Fall    Patient had fall in office at approximately 1138 am. No visible wounds, denies pain, denies dizziness, denies headache. Patient reports he did lightly hit head. A&O x4 post fall. Recommendation given to patient that since he did hit his head it would be best to get evaluated at ED. Provider aware. Patient declined offer to call EMS. Patient also declined wheelchair when it was offered by front desk.      83 year old male, presents today for [employer].  He has not had them changed or removed since last appointment.  Does not report any fevers or chills.   Objective: AAO x3, NAD DP/PT pulses palpable bilaterally, CRT less than 3 seconds Unna boots were removed.  Hyperkeratotic preulcerative lesion on the plantar aspect left foot without any underlying ulceration, drainage or signs of infection.  There are some skin breakdown noted along the proximal aspect of the leg and appears to be like a cut.  There is no active bleeding.  1 small superficial area skin breakdown noted along the anterior aspect of the right leg just proximal to the ankle.  There is no skin erythema to these areas there is no draining pus.  No fluctuation or crepitation. No pain with calf compression, swelling, warmth, erythema  Assessment: 83 year old male with venous insufficiency, history of ulcer  Plan: Venous insufficiency - We cleaned the legs today with Hibiclens  as they were dirty.  Unna boots applied bilaterally.  Gauze was placed over the area of what appears to be superficial ulceration on the anterior aspect of the leg prior to [employer] application.  Precautions were possible to remove these.  Discussed with him that once he is more stable getting back to wear compression socks.  Encouraged elevation. -Prescription for rollator with seat given.  I offered to try to order this but he wants to do this on his  own. -Patient encouraged to call the office with any questions, concerns, change in symptoms.   --  *Of note patient was late today's appointment.  Upon coming back into the office he declined wheelchair use.  He was then brought to the exam room by the CMA.  The patient tried to sit in the exam chair but fell.  States that he did scrape his head.  He does not have any headache and no LOC.  There is no area of pinpoint tenderness on exam. Vitals were obtained.  States he is not taking any blood thinners.  As he did hit his head recommended emergency room evaluation.  He declined EMS call.  Patient was able to carry on full conversation not having any pain in his AAO x 4.  He states that he will go to the doctor on his own to get checked out. I advised EMS evaluation but he declined and states I am not interested.   Vitals:   05/09/24 1143  BP: 136/62  Pulse: (!) 104  Resp: 18  SpO2: 95%      Donnice JONELLE Fees DPM

## 2024-05-18 ENCOUNTER — Ambulatory Visit (INDEPENDENT_AMBULATORY_CARE_PROVIDER_SITE_OTHER): Admitting: Podiatry

## 2024-05-18 DIAGNOSIS — Z91199 Patient's noncompliance with other medical treatment and regimen due to unspecified reason: Secondary | ICD-10-CM

## 2024-05-18 NOTE — Progress Notes (Signed)
 No show

## 2024-05-30 ENCOUNTER — Encounter (HOSPITAL_BASED_OUTPATIENT_CLINIC_OR_DEPARTMENT_OTHER): Payer: Self-pay | Admitting: Emergency Medicine

## 2024-05-30 ENCOUNTER — Emergency Department (HOSPITAL_BASED_OUTPATIENT_CLINIC_OR_DEPARTMENT_OTHER)

## 2024-05-30 ENCOUNTER — Emergency Department (HOSPITAL_BASED_OUTPATIENT_CLINIC_OR_DEPARTMENT_OTHER)
Admission: EM | Admit: 2024-05-30 | Discharge: 2024-05-31 | Disposition: A | Discharge status: Home / Self Care | Source: Ambulatory Visit | Attending: Emergency Medicine | Admitting: Emergency Medicine

## 2024-05-30 ENCOUNTER — Other Ambulatory Visit: Payer: Self-pay

## 2024-05-30 DIAGNOSIS — N400 Enlarged prostate without lower urinary tract symptoms: Secondary | ICD-10-CM | POA: Diagnosis not present

## 2024-05-30 DIAGNOSIS — N1832 Chronic kidney disease, stage 3b: Secondary | ICD-10-CM | POA: Diagnosis not present

## 2024-05-30 DIAGNOSIS — Z833 Family history of diabetes mellitus: Secondary | ICD-10-CM | POA: Diagnosis not present

## 2024-05-30 DIAGNOSIS — G4733 Obstructive sleep apnea (adult) (pediatric): Secondary | ICD-10-CM | POA: Diagnosis not present

## 2024-05-30 DIAGNOSIS — K449 Diaphragmatic hernia without obstruction or gangrene: Secondary | ICD-10-CM | POA: Diagnosis not present

## 2024-05-30 DIAGNOSIS — E785 Hyperlipidemia, unspecified: Secondary | ICD-10-CM | POA: Diagnosis not present

## 2024-05-30 DIAGNOSIS — K625 Hemorrhage of anus and rectum: Secondary | ICD-10-CM | POA: Insufficient documentation

## 2024-05-30 DIAGNOSIS — R531 Weakness: Secondary | ICD-10-CM | POA: Diagnosis not present

## 2024-05-30 DIAGNOSIS — K219 Gastro-esophageal reflux disease without esophagitis: Secondary | ICD-10-CM | POA: Diagnosis not present

## 2024-05-30 DIAGNOSIS — K5909 Other constipation: Secondary | ICD-10-CM | POA: Diagnosis not present

## 2024-05-30 DIAGNOSIS — R0989 Other specified symptoms and signs involving the circulatory and respiratory systems: Secondary | ICD-10-CM | POA: Diagnosis not present

## 2024-05-30 DIAGNOSIS — K921 Melena: Secondary | ICD-10-CM

## 2024-05-30 DIAGNOSIS — I129 Hypertensive chronic kidney disease with stage 1 through stage 4 chronic kidney disease, or unspecified chronic kidney disease: Secondary | ICD-10-CM | POA: Diagnosis not present

## 2024-05-30 DIAGNOSIS — R197 Diarrhea, unspecified: Secondary | ICD-10-CM | POA: Insufficient documentation

## 2024-05-30 DIAGNOSIS — L03115 Cellulitis of right lower limb: Secondary | ICD-10-CM | POA: Diagnosis not present

## 2024-05-30 DIAGNOSIS — Z96652 Presence of left artificial knee joint: Secondary | ICD-10-CM | POA: Diagnosis not present

## 2024-05-30 DIAGNOSIS — I700 Atherosclerosis of aorta: Secondary | ICD-10-CM | POA: Diagnosis not present

## 2024-05-30 DIAGNOSIS — B029 Zoster without complications: Secondary | ICD-10-CM | POA: Diagnosis not present

## 2024-05-30 DIAGNOSIS — Z8349 Family history of other endocrine, nutritional and metabolic diseases: Secondary | ICD-10-CM | POA: Diagnosis not present

## 2024-05-30 DIAGNOSIS — K5900 Constipation, unspecified: Secondary | ICD-10-CM | POA: Diagnosis not present

## 2024-05-30 DIAGNOSIS — R1084 Generalized abdominal pain: Secondary | ICD-10-CM | POA: Diagnosis not present

## 2024-05-30 DIAGNOSIS — B028 Zoster with other complications: Secondary | ICD-10-CM | POA: Diagnosis not present

## 2024-05-30 DIAGNOSIS — N189 Chronic kidney disease, unspecified: Secondary | ICD-10-CM | POA: Insufficient documentation

## 2024-05-30 DIAGNOSIS — R1031 Right lower quadrant pain: Secondary | ICD-10-CM | POA: Diagnosis not present

## 2024-05-30 DIAGNOSIS — Z96642 Presence of left artificial hip joint: Secondary | ICD-10-CM | POA: Diagnosis not present

## 2024-05-30 DIAGNOSIS — J45909 Unspecified asthma, uncomplicated: Secondary | ICD-10-CM | POA: Diagnosis not present

## 2024-05-30 DIAGNOSIS — M7989 Other specified soft tissue disorders: Secondary | ICD-10-CM | POA: Diagnosis not present

## 2024-05-30 DIAGNOSIS — R7303 Prediabetes: Secondary | ICD-10-CM | POA: Diagnosis not present

## 2024-05-30 DIAGNOSIS — N200 Calculus of kidney: Secondary | ICD-10-CM | POA: Diagnosis not present

## 2024-05-30 DIAGNOSIS — F32A Depression, unspecified: Secondary | ICD-10-CM | POA: Diagnosis not present

## 2024-05-30 DIAGNOSIS — Z79899 Other long term (current) drug therapy: Secondary | ICD-10-CM | POA: Diagnosis not present

## 2024-05-30 DIAGNOSIS — R1032 Left lower quadrant pain: Secondary | ICD-10-CM | POA: Diagnosis not present

## 2024-05-30 DIAGNOSIS — E876 Hypokalemia: Secondary | ICD-10-CM | POA: Diagnosis not present

## 2024-05-30 DIAGNOSIS — R079 Chest pain, unspecified: Secondary | ICD-10-CM | POA: Diagnosis not present

## 2024-05-30 DIAGNOSIS — F419 Anxiety disorder, unspecified: Secondary | ICD-10-CM | POA: Diagnosis not present

## 2024-05-30 DIAGNOSIS — R0789 Other chest pain: Secondary | ICD-10-CM | POA: Diagnosis not present

## 2024-05-30 DIAGNOSIS — Z8249 Family history of ischemic heart disease and other diseases of the circulatory system: Secondary | ICD-10-CM | POA: Diagnosis not present

## 2024-05-30 DIAGNOSIS — K5730 Diverticulosis of large intestine without perforation or abscess without bleeding: Secondary | ICD-10-CM | POA: Diagnosis not present

## 2024-05-30 DIAGNOSIS — Z89421 Acquired absence of other right toe(s): Secondary | ICD-10-CM | POA: Diagnosis not present

## 2024-05-30 DIAGNOSIS — I872 Venous insufficiency (chronic) (peripheral): Secondary | ICD-10-CM | POA: Diagnosis not present

## 2024-05-30 LAB — COMPREHENSIVE METABOLIC PANEL WITH GFR
ALT: 44 U/L (ref 0–44)
AST: 50 U/L — ABNORMAL HIGH (ref 15–41)
Albumin: 4.3 g/dL (ref 3.5–5.0)
Alkaline Phosphatase: 138 U/L — ABNORMAL HIGH (ref 38–126)
Anion gap: 17 — ABNORMAL HIGH (ref 5–15)
BUN: 20 mg/dL (ref 8–23)
CO2: 23 mmol/L (ref 22–32)
Calcium: 9.6 mg/dL (ref 8.9–10.3)
Chloride: 99 mmol/L (ref 98–111)
Creatinine, Ser: 2.2 mg/dL — ABNORMAL HIGH (ref 0.61–1.24)
GFR, Estimated: 29 mL/min — ABNORMAL LOW (ref >60)
Glucose, Bld: 122 mg/dL — ABNORMAL HIGH (ref 70–99)
Potassium: 3.7 mmol/L (ref 3.5–5.1)
Sodium: 139 mmol/L (ref 135–145)
Total Bilirubin: 0.7 mg/dL (ref 0.0–1.2)
Total Protein: 7.3 g/dL (ref 6.5–8.1)

## 2024-05-30 LAB — CBC
HCT: 38 % — ABNORMAL LOW (ref 39.0–52.0)
Hemoglobin: 13.4 g/dL (ref 13.0–17.0)
MCH: 33.8 pg (ref 26.0–34.0)
MCHC: 35.3 g/dL (ref 30.0–36.0)
MCV: 96 fL (ref 80.0–100.0)
Platelets: 169 K/uL (ref 150–400)
RBC: 3.96 MIL/uL — ABNORMAL LOW (ref 4.22–5.81)
RDW: 12.7 % (ref 11.5–15.5)
WBC: 5.7 K/uL (ref 4.0–10.5)
nRBC: 0 % (ref 0.0–0.2)

## 2024-05-30 LAB — URINALYSIS, ROUTINE W REFLEX MICROSCOPIC
Bilirubin Urine: NEGATIVE
Glucose, UA: NEGATIVE mg/dL
Hgb urine dipstick: NEGATIVE
Ketones, ur: NEGATIVE mg/dL
Leukocytes,Ua: NEGATIVE
Nitrite: NEGATIVE
Protein, ur: NEGATIVE mg/dL
Specific Gravity, Urine: 1.009 (ref 1.005–1.030)
pH: 5.5 (ref 5.0–8.0)

## 2024-05-30 LAB — OCCULT BLOOD X 1 CARD TO LAB, STOOL: Fecal Occult Bld: POSITIVE — AB

## 2024-05-30 LAB — LIPASE, BLOOD: Lipase: 52 U/L — ABNORMAL HIGH (ref 11–51)

## 2024-05-30 MED ORDER — VALACYCLOVIR HCL 500 MG PO TABS
1000.0000 mg | ORAL_TABLET | Freq: Once | ORAL | Status: AC
  Administered 2024-05-30: 1000 mg via ORAL
  Filled 2024-05-30: qty 2

## 2024-05-30 MED ORDER — SODIUM CHLORIDE 0.9 % IV BOLUS: 500.0000 mL | Freq: Once | INTRAVENOUS | Status: DC

## 2024-05-30 MED ORDER — VALACYCLOVIR HCL 1 G PO TABS: 1000.0000 mg | ORAL_TABLET | Freq: Three times a day (TID) | ORAL | 0 refills | Status: DC

## 2024-05-30 MED ORDER — FIBERCON 625 MG PO TABS: 625.0000 mg | ORAL_TABLET | Freq: Every day | ORAL | 0 refills | Status: DC

## 2024-05-30 NOTE — ED Triage Notes (Signed)
 Diarrhea started 7 days ago ( has had a few episodes) Some pressure and pains this AM Normally has some constipation`  Also reports a rash on right thigh

## 2024-05-30 NOTE — ED Provider Notes (Signed)
 [place] EMERGENCY DEPARTMENT AT [facility] Provider Note   CSN: [id number] Arrival date & time: 05/30/24  2105     Patient presents with: Abdominal Pain   R W Leasure is a 83 y.o. male.   Pt is a 83 yo male with pmhx significant for ddd, arthritis, hld, gerd, kidney stones, bph, and ckd.  Pt presents to the ED with diarrhea and fecal incontinence.  Pt said he's had 3 episodes in the last few days where he feels like he has to have a bowel movement and has no time to get to the bathroom and has to go.  He also has a rash to his right upper thigh.  He is having intermittent abd pain.  No n/v.  No fevers.       Prior to Admission medications   Medication Sig Start Date End Date Taking? Authorizing Provider  polycarbophil (FIBERCON) 625 MG tablet Take 1 tablet (625 mg total) by mouth daily. 05/30/24  Yes Dean Clarity, MD  ASTEPRO  205.5 MCG/SPRAY SOLN Place 2 sprays into both nostrils in the morning.    [provider]  buPROPion  (WELLBUTRIN  XL) 150 MG 24 hr tablet Take 450 mg by mouth every morning.    [provider]  fluticasone  (FLONASE  ALLERGY RELIEF) 50 MCG/ACT nasal spray Place 2 sprays into both nostrils every evening.    [provider]  furosemide  (LASIX ) 40 MG tablet TAKE 1 TABLET BY MOUTH EVERY MORNING AND AN ADDITIONAL TABLET DAILY AS NEEDED FOR UNRESOLVED SWELLING OR FLUID RETENTION 12/01/23   Court Dorn PARAS, MD  gabapentin  (NEURONTIN ) 300 MG capsule TAKE 1 TO 2 CAPSULES BY MOUTH AT BEDTIME Patient taking differently: Take 600 mg by mouth at bedtime. 01/15/17   Gretta Ozell CROME, PA-C  HYDROcodone -acetaminophen  (NORCO/VICODIN) 5-325 MG tablet Take 1-2 tablets by mouth every 6 (six) hours as needed. 02/04/24   Patsey Lot, MD  meclizine  (ANTIVERT ) 25 MG tablet Take 1 tablet (25 mg total) by mouth 3 (three) times daily as needed for dizziness. 08/27/23   Raford Lenis, MD  methocarbamol  (ROBAXIN ) 500 MG tablet Take 1 tablet (500 mg  total) by mouth every 8 (eight) hours as needed for muscle spasms. Patient taking differently: Take 500 mg by mouth in the morning and at bedtime. 08/08/21   Eudelia Maude SAUNDERS, PA-C  mirtazapine  (REMERON ) 15 MG tablet Take 15 mg by mouth at bedtime.    [provider]  montelukast  (SINGULAIR ) 10 MG tablet Take 1 tablet (10 mg total) by mouth at bedtime. 04/10/23   Rai, Nydia POUR, MD  Multiple Vitamin (MULTIVITAMIN WITH MINERALS) TABS tablet Take 1 tablet by mouth every morning. 12/30/23   Watt Rush, MD  pantoprazole  (PROTONIX ) 40 MG tablet Take 1 tablet (40 mg total) by mouth 2 (two) times daily before a meal. 04/10/23   Rai, Ripudeep K, MD  potassium chloride  SA (KLOR-CON  M) 20 MEQ tablet Take 2 tablets (40 mEq total) by mouth 2 (two) times daily. 08/27/23   Raford Lenis, MD  rosuvastatin  (CRESTOR ) 5 MG tablet Take 5 mg by mouth at bedtime. 12/20/18   [provider]  tamsulosin  (FLOMAX ) 0.4 MG CAPS capsule Take 0.4 mg by mouth daily.    [provider]  valACYclovir  (VALTREX ) 1000 MG tablet Take 1 tablet (1,000 mg total) by mouth 3 (three) times daily. 05/30/24  Yes Dean Clarity, MD    Allergies: Patient has no known allergies.    Review of Systems  Gastrointestinal:  Positive for abdominal  pain.  All other systems reviewed and are negative.   Updated Vital Signs BP 126/73 (BP Location: Right Arm)   Pulse 100   Temp 98.5 F (36.9 C) (Oral)   Resp 20   SpO2 96%   Physical Exam Vitals and nursing note reviewed.  Constitutional:      Appearance: He is well-developed. He is obese.  HENT:     Head: Normocephalic and atraumatic.     Mouth/Throat:     Mouth: Mucous membranes are moist.     Pharynx: Oropharynx is clear.  Eyes:     Extraocular Movements: Extraocular movements intact.     Pupils: Pupils are equal, round, and reactive to light.  Cardiovascular:     Rate and Rhythm: Normal rate and regular rhythm.     Heart sounds: Normal heart sounds.   Pulmonary:     Effort: Pulmonary effort is normal.     Breath sounds: Normal breath sounds.  Abdominal:     General: Abdomen is flat. Bowel sounds are normal.     Palpations: Abdomen is soft.     Tenderness: There is no abdominal tenderness.  Genitourinary:    Rectum: Guaiac result positive.  Skin:    General: Skin is warm.     Capillary Refill: Capillary refill takes less than 2 seconds.     Comments: Shingles rash to right upper thigh  Neurological:     General: No focal deficit present.     Mental Status: He is alert and oriented to person, place, and time.  Psychiatric:        Mood and Affect: Mood normal.        Behavior: Behavior normal.     (all labs ordered are listed, but only abnormal results are displayed) Labs Reviewed  LIPASE, BLOOD - Abnormal; Notable for the following components:      Result Value   Lipase 52 (*)    All other components within normal limits  COMPREHENSIVE METABOLIC PANEL WITH GFR - Abnormal; Notable for the following components:   Glucose, Bld 122 (*)    Creatinine, Ser 2.20 (*)    AST 50 (*)    Alkaline Phosphatase 138 (*)    GFR, Estimated 29 (*)    Anion gap 17 (*)    All other components within normal limits  CBC - Abnormal; Notable for the following components:   RBC 3.96 (*)    HCT 38.0 (*)    All other components within normal limits  URINALYSIS, ROUTINE W REFLEX MICROSCOPIC - Abnormal; Notable for the following components:   Color, Urine COLORLESS (*)    All other components within normal limits  OCCULT BLOOD X 1 CARD TO LAB, STOOL - Abnormal; Notable for the following components:   Fecal Occult Bld POSITIVE (*)    All other components within normal limits    EKG: None  Radiology: CT ABDOMEN PELVIS WO CONTRAST Result Date: 05/30/2024 CLINICAL DATA:  Abdominal pain, acute, nonlocalized EXAM: CT ABDOMEN AND PELVIS WITHOUT CONTRAST TECHNIQUE: Multidetector CT imaging of the abdomen and pelvis was performed following the  standard protocol without IV contrast. RADIATION DOSE REDUCTION: This exam was performed according to the departmental dose-optimization program which includes automated exposure control, adjustment of the mA and/or kV according to patient size and/or use of iterative reconstruction technique. COMPARISON:  CT abdomen pelvis 03/18/2023 FINDINGS: Lower chest: No acute abnormality. Tiny hiatal hernia. Coronary artery calcification. Hepatobiliary: No focal liver abnormality. No gallstones, gallbladder wall thickening, or pericholecystic fluid.  No biliary dilatation. Pancreas: No focal lesion. Normal pancreatic contour. No surrounding inflammatory changes. No main pancreatic ductal dilatation. Spleen: Normal in size without focal abnormality. Adrenals/Urinary Tract: No adrenal nodule bilaterally. Right nephrolithiasis measuring up to 8 mm. No left nephrolithiasis. No hydronephrosis. No definite contour-deforming renal mass. No ureterolithiasis or hydroureter. The urinary bladder is unremarkable. Stomach/Bowel: Stomach is within normal limits. No evidence of bowel wall thickening or dilatation. Colonic diverticulosis. Appendix appears normal. Vascular/Lymphatic: No abdominal aorta or iliac aneurysm. Severe atherosclerotic plaque of the aorta and its branches. No abdominal, pelvic, or inguinal lymphadenopathy. Reproductive: Prostate is unremarkable. Other: No intraperitoneal free fluid. No intraperitoneal free gas. No organized fluid collection. Musculoskeletal: No abdominal wall hernia or abnormality. No suspicious lytic or blastic osseous lesions. No acute displaced fracture. Levocurvature of the thoracolumbar spine. Multilevel degenerative changes of the spine. Multilevel intervertebral disc space vacuum phenomenon. Mild retrolisthesis of L1 on L2, L2 on L3 grade 1 anterolisthesis of L4 on L5. Total left hip arthroplasty. At least mild degenerative changes of the right hip. IMPRESSION: 1. Tiny hiatal hernia. 2. Colonic  diverticulosis with no acute diverticulitis. 3. Nonobstructive right nephrolithiasis. 4.  Aortic Atherosclerosis (ICD10-I70.0). Electronically Signed   By: Morgane  Naveau M.D.   On: 05/30/2024 22:48     Procedures   Medications Ordered in the ED  valACYclovir  (VALTREX ) tablet 1,000 mg (1,000 mg Oral Given 05/30/24 2342)                                    Medical Decision Making Amount and/or Complexity of Data Reviewed Labs: ordered. Radiology: ordered.  Risk OTC drugs. Prescription drug management.   This patient presents to the ED for concern of abd pain, this involves an extensive number of treatment options, and is a complaint that carries with it a high risk of complications and morbidity.  The differential diagnosis includes fecal impaction, constipation, diverticulitis, appendicitis, cholecystitis, uti   Co morbidities that complicate the patient evaluation  ddd, arthritis, hld, gerd, kidney stones, bph, and ckd   Additional history obtained:  Additional history obtained from epic chart review  Lab Tests:  I Ordered, and personally interpreted labs.  The pertinent results include:  cbc nl, cmp nl other than cr elevated at 2.2 (stable); ua nl   Imaging Studies ordered:  I ordered imaging studies including ct abd/pelvis  I independently visualized and interpreted imaging which showed   Tiny hiatal hernia.  2. Colonic diverticulosis with no acute diverticulitis.  3. Nonobstructive right nephrolithiasis.  4.  Aortic Atherosclerosis (ICD10-I70.0).   I agree with the radiologist interpretation   Medicines ordered and prescription drug management:  I ordered medication including valtrex  for sx  Reevaluation of the patient after these medicines showed that the patient improved I have reviewed the patients home medicines and have made adjustments as needed   Test Considered:  ct   Problem List / ED Course:  Shingles:  pt started on valtrex  Abd pain  with incontinence: pt is feeling much better now.  He is able to eat/drink.   pt started on fibercon. He is to f/u with GI.  Return if worse.  Blood in stool:  pt is not on thinners.  Hgb is nl.  He likely has internal hemorrhoids.  He is unable to place a suppository.  He has seen [facility] for the same in the past.  He's told to f/u with them.  Social situation:  pt reveals to me that he [place] a [place] that does not have indoor plumbing or a bathroom.  He has to find different places to go use the facilities.     Reevaluation:  After the interventions noted above, I reevaluated the patient and found that they have :improved   Social Determinants of Health:  Lives at home   Dispostion:  After consideration of the diagnostic results and the patients response to treatment, I feel that the patent would benefit from discharge with outpatient f/u.       Final diagnoses:  Herpes zoster without complication  Diarrhea, unspecified type  Hematochezia    ED Discharge Orders          Ordered    valACYclovir  (VALTREX ) 1000 MG tablet  3 times daily        05/30/24 2334    polycarbophil (FIBERCON) 625 MG tablet  Daily        05/30/24 2335    Ambulatory referral to Gastroenterology        05/30/24 2337               Dean Clarity, MD 05/30/24 7661    Dean Clarity, MD 05/31/24 [phone]

## 2024-05-30 NOTE — ED Notes (Signed)
 Patient transported to CT

## 2024-06-01 ENCOUNTER — Emergency Department (HOSPITAL_COMMUNITY)

## 2024-06-01 ENCOUNTER — Encounter (HOSPITAL_COMMUNITY): Payer: Self-pay

## 2024-06-01 ENCOUNTER — Other Ambulatory Visit: Payer: Self-pay

## 2024-06-01 ENCOUNTER — Inpatient Hospital Stay (HOSPITAL_COMMUNITY)
Admission: EM | Admit: 2024-06-01 | Discharge: 2024-06-07 | DRG: 596 | Disposition: A | Discharge status: Home / Self Care | Attending: Internal Medicine | Admitting: Internal Medicine

## 2024-06-01 DIAGNOSIS — L03115 Cellulitis of right lower limb: Secondary | ICD-10-CM | POA: Diagnosis present

## 2024-06-01 DIAGNOSIS — B029 Zoster without complications: Secondary | ICD-10-CM | POA: Diagnosis present

## 2024-06-01 DIAGNOSIS — B028 Zoster with other complications: Secondary | ICD-10-CM

## 2024-06-01 DIAGNOSIS — E876 Hypokalemia: Secondary | ICD-10-CM | POA: Diagnosis present

## 2024-06-01 DIAGNOSIS — E785 Hyperlipidemia, unspecified: Secondary | ICD-10-CM | POA: Diagnosis present

## 2024-06-01 DIAGNOSIS — I129 Hypertensive chronic kidney disease with stage 1 through stage 4 chronic kidney disease, or unspecified chronic kidney disease: Secondary | ICD-10-CM | POA: Diagnosis present

## 2024-06-01 DIAGNOSIS — Z833 Family history of diabetes mellitus: Secondary | ICD-10-CM | POA: Diagnosis not present

## 2024-06-01 DIAGNOSIS — K5909 Other constipation: Secondary | ICD-10-CM | POA: Diagnosis present

## 2024-06-01 DIAGNOSIS — R7303 Prediabetes: Secondary | ICD-10-CM | POA: Diagnosis present

## 2024-06-01 DIAGNOSIS — Z79899 Other long term (current) drug therapy: Secondary | ICD-10-CM | POA: Diagnosis not present

## 2024-06-01 DIAGNOSIS — K219 Gastro-esophageal reflux disease without esophagitis: Secondary | ICD-10-CM | POA: Diagnosis present

## 2024-06-01 DIAGNOSIS — N1832 Chronic kidney disease, stage 3b: Secondary | ICD-10-CM | POA: Diagnosis present

## 2024-06-01 DIAGNOSIS — Z89421 Acquired absence of other right toe(s): Secondary | ICD-10-CM

## 2024-06-01 DIAGNOSIS — I872 Venous insufficiency (chronic) (peripheral): Secondary | ICD-10-CM | POA: Diagnosis present

## 2024-06-01 DIAGNOSIS — M6701 Short Achilles tendon (acquired), right ankle: Secondary | ICD-10-CM

## 2024-06-01 DIAGNOSIS — R531 Weakness: Secondary | ICD-10-CM

## 2024-06-01 DIAGNOSIS — G4733 Obstructive sleep apnea (adult) (pediatric): Secondary | ICD-10-CM | POA: Diagnosis present

## 2024-06-01 DIAGNOSIS — F32A Depression, unspecified: Secondary | ICD-10-CM | POA: Diagnosis present

## 2024-06-01 DIAGNOSIS — R5381 Other malaise: Secondary | ICD-10-CM | POA: Diagnosis present

## 2024-06-01 DIAGNOSIS — M51369 Other intervertebral disc degeneration, lumbar region without mention of lumbar back pain or lower extremity pain: Secondary | ICD-10-CM | POA: Diagnosis present

## 2024-06-01 DIAGNOSIS — J45909 Unspecified asthma, uncomplicated: Secondary | ICD-10-CM | POA: Diagnosis present

## 2024-06-01 DIAGNOSIS — G8929 Other chronic pain: Secondary | ICD-10-CM | POA: Diagnosis present

## 2024-06-01 DIAGNOSIS — K921 Melena: Secondary | ICD-10-CM | POA: Diagnosis present

## 2024-06-01 DIAGNOSIS — K5730 Diverticulosis of large intestine without perforation or abscess without bleeding: Secondary | ICD-10-CM | POA: Diagnosis present

## 2024-06-01 DIAGNOSIS — Z96652 Presence of left artificial knee joint: Secondary | ICD-10-CM | POA: Diagnosis present

## 2024-06-01 DIAGNOSIS — Z8249 Family history of ischemic heart disease and other diseases of the circulatory system: Secondary | ICD-10-CM

## 2024-06-01 DIAGNOSIS — M51379 Other intervertebral disc degeneration, lumbosacral region without mention of lumbar back pain or lower extremity pain: Secondary | ICD-10-CM | POA: Diagnosis present

## 2024-06-01 DIAGNOSIS — Z6836 Body mass index (BMI) 36.0-36.9, adult: Secondary | ICD-10-CM

## 2024-06-01 DIAGNOSIS — F419 Anxiety disorder, unspecified: Secondary | ICD-10-CM | POA: Diagnosis present

## 2024-06-01 DIAGNOSIS — R159 Full incontinence of feces: Secondary | ICD-10-CM | POA: Diagnosis present

## 2024-06-01 DIAGNOSIS — L0390 Cellulitis, unspecified: Principal | ICD-10-CM | POA: Diagnosis present

## 2024-06-01 DIAGNOSIS — Z96642 Presence of left artificial hip joint: Secondary | ICD-10-CM | POA: Diagnosis present

## 2024-06-01 DIAGNOSIS — N400 Enlarged prostate without lower urinary tract symptoms: Secondary | ICD-10-CM | POA: Diagnosis present

## 2024-06-01 DIAGNOSIS — Z8349 Family history of other endocrine, nutritional and metabolic diseases: Secondary | ICD-10-CM

## 2024-06-01 DIAGNOSIS — M7989 Other specified soft tissue disorders: Secondary | ICD-10-CM | POA: Diagnosis not present

## 2024-06-01 LAB — CBC WITH DIFFERENTIAL/PLATELET
Abs Immature Granulocytes: 0.02 K/uL (ref 0.00–0.07)
Basophils Absolute: 0 K/uL (ref 0.0–0.1)
Basophils Relative: 1 %
Eosinophils Absolute: 0.1 K/uL (ref 0.0–0.5)
Eosinophils Relative: 4 %
HCT: 39.3 % (ref 39.0–52.0)
Hemoglobin: 13.2 g/dL (ref 13.0–17.0)
Immature Granulocytes: 1 %
Lymphocytes Relative: 16 %
Lymphs Abs: 0.7 K/uL (ref 0.7–4.0)
MCH: 32.3 pg (ref 26.0–34.0)
MCHC: 33.6 g/dL (ref 30.0–36.0)
MCV: 96.1 fL (ref 80.0–100.0)
Monocytes Absolute: 0.6 K/uL (ref 0.1–1.0)
Monocytes Relative: 14 %
Neutro Abs: 2.6 K/uL (ref 1.7–7.7)
Neutrophils Relative %: 64 %
Platelets: 166 K/uL (ref 150–400)
RBC: 4.09 MIL/uL — ABNORMAL LOW (ref 4.22–5.81)
RDW: 12.7 % (ref 11.5–15.5)
WBC: 4 K/uL (ref 4.0–10.5)
nRBC: 0 % (ref 0.0–0.2)

## 2024-06-01 LAB — I-STAT CG4 LACTIC ACID, ED: Lactic Acid, Venous: 1.4 mmol/L (ref 0.5–1.9)

## 2024-06-01 LAB — COMPREHENSIVE METABOLIC PANEL WITH GFR
ALT: 51 U/L — ABNORMAL HIGH (ref 0–44)
AST: 54 U/L — ABNORMAL HIGH (ref 15–41)
Albumin: 4.1 g/dL (ref 3.5–5.0)
Alkaline Phosphatase: 115 U/L (ref 38–126)
Anion gap: 14 (ref 5–15)
BUN: 20 mg/dL (ref 8–23)
CO2: 25 mmol/L (ref 22–32)
Calcium: 9.2 mg/dL (ref 8.9–10.3)
Chloride: 100 mmol/L (ref 98–111)
Creatinine, Ser: 1.98 mg/dL — ABNORMAL HIGH (ref 0.61–1.24)
GFR, Estimated: 33 mL/min — ABNORMAL LOW (ref >60)
Glucose, Bld: 116 mg/dL — ABNORMAL HIGH (ref 70–99)
Potassium: 2.9 mmol/L — ABNORMAL LOW (ref 3.5–5.1)
Sodium: 139 mmol/L (ref 135–145)
Total Bilirubin: 1.2 mg/dL (ref 0.0–1.2)
Total Protein: 7.5 g/dL (ref 6.5–8.1)

## 2024-06-01 LAB — URINALYSIS, ROUTINE W REFLEX MICROSCOPIC
Bilirubin Urine: NEGATIVE
Glucose, UA: NEGATIVE mg/dL
Hgb urine dipstick: NEGATIVE
Ketones, ur: NEGATIVE mg/dL
Leukocytes,Ua: NEGATIVE
Nitrite: NEGATIVE
Protein, ur: NEGATIVE mg/dL
Specific Gravity, Urine: 1.006 (ref 1.005–1.030)
pH: 6 (ref 5.0–8.0)

## 2024-06-01 LAB — MAGNESIUM: Magnesium: 1.8 mg/dL (ref 1.7–2.4)

## 2024-06-01 LAB — TROPONIN I (HIGH SENSITIVITY)
Troponin I (High Sensitivity): 8 ng/L (ref <18)
Troponin I (High Sensitivity): 9 ng/L (ref <18)

## 2024-06-01 LAB — LIPASE, BLOOD: Lipase: 61 U/L — ABNORMAL HIGH (ref 11–51)

## 2024-06-01 MED ORDER — ENOXAPARIN SODIUM 40 MG/0.4ML IJ SOSY
40.0000 mg | PREFILLED_SYRINGE | INTRAMUSCULAR | Status: DC
  Administered 2024-06-02 – 2024-06-07 (×6): 40 mg via SUBCUTANEOUS
  Filled 2024-06-01 (×6): qty 0.4

## 2024-06-01 MED ORDER — ACETAMINOPHEN 650 MG RE SUPP: 650.0000 mg | Freq: Four times a day (QID) | RECTAL | Status: DC | PRN

## 2024-06-01 MED ORDER — ONDANSETRON HCL 4 MG/2ML IJ SOLN
4.0000 mg | Freq: Once | INTRAMUSCULAR | Status: AC
  Administered 2024-06-01: 4 mg via INTRAVENOUS
  Filled 2024-06-01: qty 2

## 2024-06-01 MED ORDER — ACETAMINOPHEN 325 MG PO TABS
650.0000 mg | ORAL_TABLET | Freq: Four times a day (QID) | ORAL | Status: DC | PRN
  Administered 2024-06-02 – 2024-06-06 (×2): 650 mg via ORAL
  Filled 2024-06-01 (×3): qty 2

## 2024-06-01 MED ORDER — LACTATED RINGERS IV SOLN: INTRAVENOUS | Status: AC

## 2024-06-01 MED ORDER — CEFAZOLIN SODIUM-DEXTROSE 2-4 GM/100ML-% IV SOLN
2.0000 g | Freq: Three times a day (TID) | INTRAVENOUS | Status: DC
  Administered 2024-06-02 – 2024-06-04 (×7): 2 g via INTRAVENOUS
  Filled 2024-06-01 (×9): qty 100

## 2024-06-01 MED ORDER — POTASSIUM CHLORIDE CRYS ER 20 MEQ PO TBCR
40.0000 meq | EXTENDED_RELEASE_TABLET | Freq: Once | ORAL | Status: AC
  Administered 2024-06-01: 40 meq via ORAL
  Filled 2024-06-01: qty 2

## 2024-06-01 MED ORDER — PANTOPRAZOLE SODIUM 40 MG PO TBEC
40.0000 mg | DELAYED_RELEASE_TABLET | Freq: Two times a day (BID) | ORAL | Status: DC
  Administered 2024-06-02 – 2024-06-07 (×11): 40 mg via ORAL
  Filled 2024-06-01 (×11): qty 1

## 2024-06-01 MED ORDER — POTASSIUM CHLORIDE CRYS ER 20 MEQ PO TBCR
40.0000 meq | EXTENDED_RELEASE_TABLET | Freq: Two times a day (BID) | ORAL | Status: DC
Start: 2024-06-02 — End: 2024-06-07
  Administered 2024-06-02 – 2024-06-07 (×11): 40 meq via ORAL
  Filled 2024-06-01 (×11): qty 2

## 2024-06-01 MED ORDER — GABAPENTIN 300 MG PO CAPS
600.0000 mg | ORAL_CAPSULE | Freq: Every day | ORAL | Status: DC
  Administered 2024-06-02 – 2024-06-06 (×5): 600 mg via ORAL
  Filled 2024-06-01 (×5): qty 2

## 2024-06-01 MED ORDER — FLUTICASONE PROPIONATE 50 MCG/ACT NA SUSP
2.0000 | Freq: Every evening | NASAL | Status: DC
  Administered 2024-06-02 – 2024-06-06 (×5): 2 via NASAL
  Filled 2024-06-01: qty 16

## 2024-06-01 MED ORDER — FLORANEX PO PACK
1.0000 g | PACK | Freq: Three times a day (TID) | ORAL | Status: DC
  Administered 2024-06-02 – 2024-06-07 (×14): 1 g via ORAL
  Filled 2024-06-01 (×18): qty 1

## 2024-06-01 MED ORDER — FUROSEMIDE 40 MG PO TABS
40.0000 mg | ORAL_TABLET | Freq: Every day | ORAL | Status: DC
  Administered 2024-06-02: 40 mg via ORAL
  Filled 2024-06-01: qty 1

## 2024-06-01 MED ORDER — DEXTROSE 5 % IV SOLN
700.0000 mg | Freq: Two times a day (BID) | INTRAVENOUS | Status: DC
  Administered 2024-06-01 – 2024-06-02 (×2): 700 mg via INTRAVENOUS
  Filled 2024-06-01 (×2): qty 14

## 2024-06-01 MED ORDER — HYDROCODONE-ACETAMINOPHEN 5-325 MG PO TABS
1.0000 | ORAL_TABLET | Freq: Four times a day (QID) | ORAL | Status: DC | PRN
  Administered 2024-06-01: 1 via ORAL
  Administered 2024-06-02 (×2): 2 via ORAL
  Administered 2024-06-02 – 2024-06-03 (×2): 1 via ORAL
  Administered 2024-06-03 (×2): 2 via ORAL
  Administered 2024-06-03: 1 via ORAL
  Administered 2024-06-04: 2 via ORAL
  Filled 2024-06-01: qty 1
  Filled 2024-06-01: qty 2
  Filled 2024-06-01 (×2): qty 1
  Filled 2024-06-01: qty 2
  Filled 2024-06-01 (×4): qty 1
  Filled 2024-06-01 (×2): qty 2

## 2024-06-01 MED ORDER — HYDRALAZINE HCL 20 MG/ML IJ SOLN: 5.0000 mg | Freq: Four times a day (QID) | INTRAMUSCULAR | Status: DC | PRN

## 2024-06-01 MED ORDER — SODIUM CHLORIDE 0.9 % IV SOLN: INTRAVENOUS | Status: DC

## 2024-06-01 MED ORDER — VANCOMYCIN HCL 1500 MG/300ML IV SOLN
1500.0000 mg | INTRAVENOUS | Status: DC
  Administered 2024-06-01: 1500 mg via INTRAVENOUS
  Filled 2024-06-01: qty 300

## 2024-06-01 MED ORDER — ROSUVASTATIN CALCIUM 5 MG PO TABS
5.0000 mg | ORAL_TABLET | Freq: Every day | ORAL | Status: DC
Start: 2024-06-01 — End: 2024-06-07
  Administered 2024-06-02 – 2024-06-06 (×5): 5 mg via ORAL
  Filled 2024-06-01 (×7): qty 1

## 2024-06-01 MED ORDER — POTASSIUM CHLORIDE 10 MEQ/100ML IV SOLN
10.0000 meq | Freq: Once | INTRAVENOUS | Status: AC
  Administered 2024-06-01: 10 meq via INTRAVENOUS
  Filled 2024-06-01: qty 100

## 2024-06-01 MED ORDER — MONTELUKAST SODIUM 10 MG PO TABS
10.0000 mg | ORAL_TABLET | Freq: Every day | ORAL | Status: DC
  Administered 2024-06-02 – 2024-06-06 (×5): 10 mg via ORAL
  Filled 2024-06-01 (×6): qty 1

## 2024-06-01 MED ORDER — TAMSULOSIN HCL 0.4 MG PO CAPS
0.4000 mg | ORAL_CAPSULE | Freq: Every day | ORAL | Status: DC
  Administered 2024-06-02 – 2024-06-07 (×6): 0.4 mg via ORAL
  Filled 2024-06-01 (×6): qty 1

## 2024-06-01 MED ORDER — METHOCARBAMOL 500 MG PO TABS
500.0000 mg | ORAL_TABLET | Freq: Three times a day (TID) | ORAL | Status: DC | PRN
  Administered 2024-06-02 – 2024-06-04 (×5): 500 mg via ORAL
  Filled 2024-06-01 (×6): qty 1

## 2024-06-01 MED ORDER — DEXTROSE 5 % IV SOLN
700.0000 mg | Freq: Three times a day (TID) | INTRAVENOUS | Status: DC
  Filled 2024-06-01 (×2): qty 14

## 2024-06-01 MED ORDER — BUPROPION HCL ER (XL) 300 MG PO TB24
450.0000 mg | ORAL_TABLET | Freq: Every morning | ORAL | Status: DC
  Administered 2024-06-02 – 2024-06-07 (×6): 450 mg via ORAL
  Filled 2024-06-01 (×4): qty 1
  Filled 2024-06-01: qty 3
  Filled 2024-06-01: qty 1

## 2024-06-01 MED ORDER — MIRTAZAPINE 15 MG PO TABS
15.0000 mg | ORAL_TABLET | Freq: Every day | ORAL | Status: DC
  Administered 2024-06-02 – 2024-06-06 (×5): 15 mg via ORAL
  Filled 2024-06-01 (×5): qty 1

## 2024-06-01 MED ORDER — LACTATED RINGERS IV BOLUS
1000.0000 mL | Freq: Once | INTRAVENOUS | Status: AC
  Administered 2024-06-01: 1000 mL via INTRAVENOUS

## 2024-06-01 MED ORDER — MECLIZINE HCL 25 MG PO TABS: 25.0000 mg | ORAL_TABLET | Freq: Three times a day (TID) | ORAL | Status: DC | PRN

## 2024-06-01 NOTE — ED Triage Notes (Addendum)
 Pt BIBA from home, c/o constipation and diarrhea for 5 days. Diagnosis of shingles on Sunday. Has open sores in the pelvic area. Complains of chest pain on right side that radiates to back and abd that started two days ago.  Pt stated diarrhea has been orange and yellow and comes sudden. Has not had a bm today. Denies vomiting but does feel nauseous.    BP 158/88 HR 98  O2 100 RA CBG 132

## 2024-06-01 NOTE — ED Provider Notes (Signed)
 [place] EMERGENCY DEPARTMENT AT [facility] Provider Note   CSN: [id number] Arrival date & time: 06/01/24  1515     Patient presents with: Diarrhea, Constipation, and Chest Pain   [name] is a 83 y.o. male.  {Add pertinent medical, surgical, social history, OB history to HPI:32947} HPI       83 year old male with a history of hypertension, CKD, asthma, depression, recent emergency department 2 days ago with concern for diarrhea and diagnosis of shingles who presents with concern for nausea, chest pain, diarrhea.   Feeling faint, nauseas, sick Had some diarrhea once a day, comes on suddenly and uncontrollably, fecal incontinence, went in for that and found to have shingles, right upper thigh, has spread some, worse than the other day Right leg heavy, noticed yesterday, pain Right foot pain Nausea  Also around right chest, pins and needles across right chest and then felt it into right armpit, then moved to the back and dissipated, then came back a few hours later. Went on for several days. Started having pain in right foot. Urinating frequently  Today worse day overall.  Today didn't really get up.  Felt like when starting to walk would have vomiting or diarrhea.  Stayed laying down until finally [facility] they recommended going back to the ED   Past Medical History:  Diagnosis Date  . Allergy    takes Zyrtec daily  . Anxiety    takes Ativan  daily as needed  . Arthritis   . Asthma   . Back pain   . BPH (benign prostatic hyperplasia)    takes doxazosin  for  . Carpal tunnel syndrome   . CKD (chronic kidney disease), stage III (HCC)   . 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 Depression    takes Wellbutrin  daily  . Dyspnea on exertion    with exertion  . GERD (gastroesophageal reflux disease)    takes Nexium  and Omeprazole daily  . History of kidney stones   . HTN (hypertension)   . Hx of [facility] spotted fever childhood  .  Hyperlipidemia   . Joint pain   . Neuromuscular disorder (HCC)   . Pre-diabetes   . Prediabetes   . Sleep apnea    uses CPAP nightly  . SOB (shortness of breath)   . Swallowing difficulty   . Swelling of lower extremity    right more than leg leg  . Umbilical hernia      Prior to Admission medications   Medication Sig Start Date End Date Taking? Authorizing Provider  ASTEPRO  205.5 MCG/SPRAY SOLN Place 2 sprays into both nostrils in the morning.    [provider]  buPROPion  (WELLBUTRIN  XL) 150 MG 24 hr tablet Take 450 mg by mouth every morning.    [provider]  fluticasone  (FLONASE  ALLERGY RELIEF) 50 MCG/ACT nasal spray Place 2 sprays into both nostrils every evening.    [provider]  furosemide  (LASIX ) 40 MG tablet TAKE 1 TABLET BY MOUTH EVERY MORNING AND AN ADDITIONAL TABLET DAILY AS NEEDED FOR UNRESOLVED SWELLING OR FLUID RETENTION 12/01/23   Court Dorn PARAS, MD  gabapentin  (NEURONTIN ) 300 MG capsule TAKE 1 TO 2 CAPSULES BY MOUTH AT BEDTIME Patient taking differently: Take 600 mg by mouth at bedtime. 01/15/17   Gretta Ozell CROME, PA-C  HYDROcodone -acetaminophen  (NORCO/VICODIN) 5-325 MG tablet Take 1-2 tablets by mouth every 6 (six) hours as needed. 02/04/24   Patsey Lot, MD  meclizine  (  ANTIVERT ) 25 MG tablet Take 1 tablet (25 mg total) by mouth 3 (three) times daily as needed for dizziness. 08/27/23   Raford Lenis, MD  methocarbamol  (ROBAXIN ) 500 MG tablet Take 1 tablet (500 mg total) by mouth every 8 (eight) hours as needed for muscle spasms. Patient taking differently: Take 500 mg by mouth in the morning and at bedtime. 08/08/21   Eudelia Maude SAUNDERS, PA-C  mirtazapine  (REMERON ) 15 MG tablet Take 15 mg by mouth at bedtime.    [provider]  montelukast  (SINGULAIR ) 10 MG tablet Take 1 tablet (10 mg total) by mouth at bedtime. 04/10/23   Rai, Nydia POUR, MD  Multiple Vitamin (MULTIVITAMIN WITH MINERALS) TABS tablet Take 1 tablet by mouth  every morning. 12/30/23   Watt Rush, MD  pantoprazole  (PROTONIX ) 40 MG tablet Take 1 tablet (40 mg total) by mouth 2 (two) times daily before a meal. 04/10/23   Rai, Ripudeep K, MD  polycarbophil (FIBERCON) 625 MG tablet Take 1 tablet (625 mg total) by mouth daily. 05/30/24   Dean Clarity, MD  potassium chloride  SA (KLOR-CON  M) 20 MEQ tablet Take 2 tablets (40 mEq total) by mouth 2 (two) times daily. 08/27/23   Raford Lenis, MD  rosuvastatin  (CRESTOR ) 5 MG tablet Take 5 mg by mouth at bedtime. 12/20/18   [provider]  tamsulosin  (FLOMAX ) 0.4 MG CAPS capsule Take 0.4 mg by mouth daily.    [provider]  valACYclovir  (VALTREX ) 1000 MG tablet Take 1 tablet (1,000 mg total) by mouth 3 (three) times daily. 05/30/24   Dean Clarity, MD    Allergies: Patient has no known allergies.    Review of Systems  Updated Vital Signs BP (!) 112/55 (BP Location: Right Arm)   Pulse 89   Temp (!) 97.5 F (36.4 C) (Oral)   Resp 18   Ht 5' 9 (1.753 m)   Wt 111.1 kg   SpO2 94%   BMI 36.17 kg/m   Physical Exam  (all labs ordered are listed, but only abnormal results are displayed) Labs Reviewed - No data to display  EKG: None  Radiology: CT ABDOMEN PELVIS WO CONTRAST Result Date: 05/30/2024 CLINICAL DATA:  Abdominal pain, acute, nonlocalized EXAM: CT ABDOMEN AND PELVIS WITHOUT CONTRAST TECHNIQUE: Multidetector CT imaging of the abdomen and pelvis was performed following the standard protocol without IV contrast. RADIATION DOSE REDUCTION: This exam was performed according to the departmental dose-optimization program which includes automated exposure control, adjustment of the mA and/or kV according to patient size and/or use of iterative reconstruction technique. COMPARISON:  CT abdomen pelvis 03/18/2023 FINDINGS: Lower chest: No acute abnormality. Tiny hiatal hernia. Coronary artery calcification. Hepatobiliary: No focal liver abnormality. No gallstones, gallbladder wall  thickening, or pericholecystic fluid. No biliary dilatation. Pancreas: No focal lesion. Normal pancreatic contour. No surrounding inflammatory changes. No main pancreatic ductal dilatation. Spleen: Normal in size without focal abnormality. Adrenals/Urinary Tract: No adrenal nodule bilaterally. Right nephrolithiasis measuring up to 8 mm. No left nephrolithiasis. No hydronephrosis. No definite contour-deforming renal mass. No ureterolithiasis or hydroureter. The urinary bladder is unremarkable. Stomach/Bowel: Stomach is within normal limits. No evidence of bowel wall thickening or dilatation. Colonic diverticulosis. Appendix appears normal. Vascular/Lymphatic: No abdominal aorta or iliac aneurysm. Severe atherosclerotic plaque of the aorta and its branches. No abdominal, pelvic, or inguinal lymphadenopathy. Reproductive: Prostate is unremarkable. Other: No intraperitoneal free fluid. No intraperitoneal free gas. No organized fluid collection. Musculoskeletal: No abdominal wall hernia or abnormality. No suspicious lytic or blastic osseous lesions.  No acute displaced fracture. Levocurvature of the thoracolumbar spine. Multilevel degenerative changes of the spine. Multilevel intervertebral disc space vacuum phenomenon. Mild retrolisthesis of L1 on L2, L2 on L3 grade 1 anterolisthesis of L4 on L5. Total left hip arthroplasty. At least mild degenerative changes of the right hip. IMPRESSION: 1. Tiny hiatal hernia. 2. Colonic diverticulosis with no acute diverticulitis. 3. Nonobstructive right nephrolithiasis. 4.  Aortic Atherosclerosis (ICD10-I70.0). Electronically Signed   By: Morgane  Naveau M.D.   On: 05/30/2024 22:48    {Document cardiac monitor, telemetry assessment procedure when appropriate:32947} Procedures   Medications Ordered in the ED - No data to display    {Click here for ABCD2, HEART and other calculators REFRESH Note before signing:1}                              Medical Decision Making Amount  and/or Complexity of Data Reviewed Labs: ordered.  Risk Prescription drug management.   ***  {Document critical care time when appropriate  Document review of labs and clinical decision tools ie CHADS2VASC2, etc  Document your independent review of radiology images and any outside records  Document your discussion with family members, caretakers and with consultants  Document social determinants of health affecting pt's care  Document your decision making why or why not admission, treatments were needed:32947:::1}   Final diagnoses:  None    ED Discharge Orders     None

## 2024-06-01 NOTE — H&P (Addendum)
 History and Physical    Patient: [name] DOB: [birth year] DOA: 06/01/2024 DOS: the patient was seen and examined on 06/01/2024 PCP: Aisha Harvey, MD  Patient coming from: Home  Chief Complaint:  Chief Complaint  Patient presents with   Diarrhea   Constipation   Chest Pain   HPI: [name] is a 83 y.o. 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 He presented to ED on account of multiple complaints. He had presented to the ED 2 days prior,with complaints of diarrhea. He was seen and evaluated in the ED and diagnosed with  herpes zoster lesions on his right lower extremity.  Patient was initiated on Valtrex  as outpatient and discharged home.  He presented to the emergency room today with complaints of back pain, persistent diarrhea and right lower extremity swelling and pain.  At baseline, patient lives at home by himself.  He is able to ambulate but has had difficulty ambulating today.  He denies any chest pain or palpitation.   Review of Systems: As mentioned in the history of present illness. All other systems reviewed and are negative. 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   L4, L5 ,S1   Depression    takes Wellbutrin  daily   Dyspnea on exertion    with exertion   GERD (gastroesophageal reflux disease)    takes Nexium  and Omeprazole daily   History of kidney stones    HTN (hypertension)    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   Hyperlipidemia    Joint pain    Neuromuscular disorder (HCC)    Pre-diabetes     Prediabetes    Sleep apnea    uses CPAP nightly   SOB (shortness of breath)    Swallowing difficulty    Swelling of lower extremity    right more than leg leg   Umbilical hernia    Past Surgical History:  Procedure Laterality Date   ABDOMINAL AORTOGRAM W/LOWER EXTREMITY N/A 03/14/2020   Procedure: ABDOMINAL AORTOGRAM W/LOWER EXTREMITY;  Surgeon: Serene Gaile LELON, MD;  Location: [facility] INVASIVE CV LAB;  Service: Vascular;  Laterality: N/A;   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   AMPUTATION TOE Right 04/18/2022   Procedure: AMPUTATION RIGHT GREAT TOE;  Surgeon: Yvone Rush, MD;  Location: [facility];  Service: Orthopedics;  Laterality: Right;   BACK SURGERY  2003   L4, L5   BACK SURGERY     CATARACT EXTRACTION, BILATERAL     CYSTOSCOPY/URETEROSCOPY/HOLMIUM LASER/STENT PLACEMENT Left 12/29/2023   Procedure: CYSTOSCOPY/ LEFT URETEROSCOPY/HOLMIUM LASER/STENT PLACEMENT;  Surgeon: Watt Rush, MD;  Location: [facility] OR[facility];  Service: Urology;  Laterality: Left;  60 MINUTE CASE   ENDOVENOUS ABLATION SAPHENOUS VEIN W/ LASER Right 06-01-2014   EVLA right greater saphenous vein by Krystal Doing MD    epidural steroid injections        [facility]   hernia repair  2003  HERNIA REPAIR     hernia inguinal x 2   TOTAL HIP ARTHROPLASTY Left 05/05/2022   Procedure: LEFT TOTAL HIP ARTHROPLASTY ANTERIOR APPROACH;  Surgeon: Yvone Rush, MD;  Location: [facility];  Service: Orthopedics;  Laterality: Left;   TOTAL KNEE ARTHROPLASTY Left 03/22/2015   Procedure: TOTAL KNEE ARTHROPLASTY;  Surgeon: Glendia Cordella Hutchinson, MD;  Location: [facility];  Service: Orthopedics;  Laterality: Left;   UMBILICAL HERNIA REPAIR N/A 06/27/2016   Procedure: LAPAROSCOPIC ASSISTED REPAIR OF UMBILICAL HERINA WITH MESH;  Surgeon: Donnice Lunger, MD;  Location: [facility];  Service: General;  Laterality: N/A;   VASCULAR SURGERY  2015   right leg   Social History:  reports that he has never smoked. He has never used smokeless  tobacco. He reports current alcohol use. He reports that he does not use drugs.  No Known Allergies  Family History  Problem Relation Age of Onset   Thyroid  disease Mother    Heart disease Father    Hypertension Father    Diabetes Maternal Grandfather    Colon cancer Neg Hx    Esophageal cancer Neg Hx     Prior to Admission medications   Medication Sig Start Date End Date Taking? Authorizing Provider  ASTEPRO  205.5 MCG/SPRAY SOLN Place 2 sprays into both nostrils in the morning.    [provider]  buPROPion  (WELLBUTRIN  XL) 150 MG 24 hr tablet Take 450 mg by mouth every morning.    [provider]  fluticasone  (FLONASE  ALLERGY RELIEF) 50 MCG/ACT nasal spray Place 2 sprays into both nostrils every evening.    [provider]  furosemide  (LASIX ) 40 MG tablet TAKE 1 TABLET BY MOUTH EVERY MORNING AND AN ADDITIONAL TABLET DAILY AS NEEDED FOR UNRESOLVED SWELLING OR FLUID RETENTION 12/01/23   Court Dorn PARAS, MD  gabapentin  (NEURONTIN ) 300 MG capsule TAKE 1 TO 2 CAPSULES BY MOUTH AT BEDTIME Patient taking differently: Take 600 mg by mouth at bedtime. 01/15/17   Gretta Ozell CROME, PA-C  HYDROcodone -acetaminophen  (NORCO/VICODIN) 5-325 MG tablet Take 1-2 tablets by mouth every 6 (six) hours as needed. 02/04/24   Patsey Lot, MD  meclizine  (ANTIVERT ) 25 MG tablet Take 1 tablet (25 mg total) by mouth 3 (three) times daily as needed for dizziness. 08/27/23   Raford Lenis, MD  methocarbamol  (ROBAXIN ) 500 MG tablet Take 1 tablet (500 mg total) by mouth every 8 (eight) hours as needed for muscle spasms. Patient taking differently: Take 500 mg by mouth in the morning and at bedtime. 08/08/21   Eudelia Maude SAUNDERS, PA-C  mirtazapine  (REMERON ) 15 MG tablet Take 15 mg by mouth at bedtime.    [provider]  montelukast  (SINGULAIR ) 10 MG tablet Take 1 tablet (10 mg total) by mouth at bedtime. 04/10/23   Rai, Nydia POUR, MD  Multiple Vitamin (MULTIVITAMIN WITH MINERALS)  TABS tablet Take 1 tablet by mouth every morning. 12/30/23   Watt Rush, MD  pantoprazole  (PROTONIX ) 40 MG tablet Take 1 tablet (40 mg total) by mouth 2 (two) times daily before a meal. 04/10/23   Rai, Ripudeep K, MD  polycarbophil (FIBERCON) 625 MG tablet Take 1 tablet (625 mg total) by mouth daily. 05/30/24   Dean Clarity, MD  potassium chloride  SA (KLOR-CON  M) 20 MEQ tablet Take 2 tablets (40 mEq total) by mouth 2 (two) times daily. 08/27/23   Raford Lenis, MD  rosuvastatin  (CRESTOR ) 5 MG tablet Take 5 mg by mouth at bedtime. 12/20/18   [provider]  tamsulosin  (FLOMAX ) 0.4  MG CAPS capsule Take 0.4 mg by mouth daily.    [provider]  valACYclovir  (VALTREX ) 1000 MG tablet Take 1 tablet (1,000 mg total) by mouth 3 (three) times daily. 05/30/24   Dean Clarity, MD    Physical Exam: Vitals:   06/01/24 1800 06/01/24 1900 06/01/24 2023 06/01/24 2240  BP: (!) 184/89 (!) 175/110  108/87  Pulse: 82 87  85  Resp: 16 15  16   Temp:   97.8 F (36.6 C)   TempSrc:   Oral   SpO2: 96% 100%  97%  Weight:      Height:       General: Patient is a morbidly obese gentleman who was seen seated up in bed.  He looks slightly uncomfortable.  HEENT: Oral mucosa is moist. Neck: Short: Supple. CNS: Patient is alert oriented x 3.  CNS was difficult to assess.  He has no motor deficits. albeit has right shoulder dislocation has impaired abduction. Chest: Clinically diminished due to body habitus. Cardiovascular: Regular S1-S2 with no murmur. Back: No spinal tenderness was really elicited. Extremities significant for bilateral lower extremity edema.  Right greater than left. Skin is significant for multiple vesicular lesions with surrounding erythema.  Will right lower extremity significant for multiple erythema, venous insufficiency,  Data Reviewed: Sodium is 139, potassium of 3.9, chloride is 100, bicarb 25, glucose 116, BUN 20, creatinine 1.98, AST of 61, ALT 54, WBCs 4, hemoglobin  13.2, hematocrit 39.3, platelet count is 166, neutrophil count 64.  Glucose was 116 Urinalysis was unremarkable.  CT abdomen reviewed: Tiny hiatal hernia.  Colonic diverticulosis with no diverticulitis were noted.  Nonobstructive right nephrolithiasis were reported.  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  He presents to the emergency room with complaints of generalized weakness, back pain and difficulty with ambulation.  #1.  Acute right lower extremity cellulitis: Findings concerning for possible cellulitis.  Patient has this vesicular lesions on right thigh, concerning for herpes zoster lesions.  Cellulitis may be secondary to bacterial infection.  Patient was initiated on Valtrex  2 days prior.  Patient was unable to fill medications.  He will be continued on IV acyclovir  for now.  Patient may need IV antibiotics secondary  treatment for suspected cellulitis.  #2.  Chronic back pain: Most likely secondary to neuroforaminal stenosis.  New weakness is more profound and may be likely be related to acute infection.  If symptoms do not get better, patient may need to be further evaluated with lumbosacral spine imaging.  CT or MRI may be preferable.  Optimize pain management.  #3.  Chronic venous insufficiency: Right lower extremity greater than left.  Will obtain right lower extremity duplex ultrasound to rule out DVT.  #4.  Right shoulder dislocation.  Generalized debility: Patient may benefit from physical therapy and Occupational Therapy evaluation during the course of stay.  #5.  Morbid obesity complicated by obstructive sleep apnea.  Patient will require CPAP at night  #6.  Diarrhea/loose stool: Etiology is unclear.  GI PCR has been ordered.  #7.  Hypokalemia: Potassium replacement therapy  has been ordered.  Patient will continue with potassium chloride .  #8.  GERD: Continue with PPI.  #9.BPH on Tamsulosin    Advance Care Planning:   Code Status: Full Code   Consults:   Family Communication: none  Severity of Illness: The appropriate patient  status for this patient is INPATIENT. Inpatient status is judged to be reasonable and necessary in order to provide the required intensity of service to ensure the patient's safety. The patient's presenting symptoms, physical exam findings, and initial radiographic and laboratory data in the context of their chronic comorbidities is felt to place them at high risk for further clinical deterioration. Furthermore, it is not anticipated that the patient will be medically stable for discharge from the hospital within 2 midnights of admission.   * I certify that at the point of admission it is my clinical judgment that the patient will require inpatient hospital care spanning beyond 2 midnights from the point of admission due to high intensity of service, high risk for further deterioration and high frequency of surveillance required.*  Author: Maude MARLA Dart, MD 06/01/2024 10:49 PM  For on call review [email or web address].

## 2024-06-01 NOTE — Progress Notes (Signed)
 Pharmacy Antibiotic Note  [name] is a 83 y.o. male admitted on 06/01/2024 with Herpes Zoster and cellulitis.  Pharmacy has been consulted for acyclovir  and vancomycin  dosing.  Plan: Acyclovir  700 mg (10 mg/kg using IBW) IV every 12 hours with NS at 50 ml/hr infusing as maintenance fluid Vancomycin  1500 mg IV every 48 hours (Goal AUC 400-550, eAUC 484.6, SCr used: 1.98, Vd 0.5) Monitor clinical progress, renal function, vancomycin  levels as indicated   Height: 5' 9 (175.3 cm) Weight: 111.1 kg (244 lb 14.9 oz) IBW/kg (Calculated) : 70.7  Temp (24hrs), Avg:97.5 F (36.4 C), Min:97.5 F (36.4 C), Max:97.5 F (36.4 C)  Recent Labs  Lab 05/30/24 2124 06/01/24 1535  WBC 5.7 4.0  CREATININE 2.20* 1.98*    Estimated Creatinine Clearance: 34.7 mL/min (A) (by C-G formula based on SCr of 1.98 mg/dL (H)).    No Known Allergies  Antimicrobials this admission: 7/23 acyclovir  >>  7/23 vancomycin  >>   Microbiology results: 7/23 BCx: ordered   Thank you for allowing pharmacy to be a part of this patient's care.  Eleanor EMERSON Agent, PharmD, BCPS Clinical Pharmacist [facility] 06/01/2024 7:49 PM

## 2024-06-02 ENCOUNTER — Inpatient Hospital Stay (HOSPITAL_COMMUNITY)

## 2024-06-02 DIAGNOSIS — M7989 Other specified soft tissue disorders: Secondary | ICD-10-CM | POA: Diagnosis not present

## 2024-06-02 DIAGNOSIS — L03115 Cellulitis of right lower limb: Secondary | ICD-10-CM | POA: Diagnosis not present

## 2024-06-02 LAB — BASIC METABOLIC PANEL WITH GFR
Anion gap: 15 (ref 5–15)
BUN: 17 mg/dL (ref 8–23)
CO2: 26 mmol/L (ref 22–32)
Calcium: 9.2 mg/dL (ref 8.9–10.3)
Chloride: 97 mmol/L — ABNORMAL LOW (ref 98–111)
Creatinine, Ser: 1.89 mg/dL — ABNORMAL HIGH (ref 0.61–1.24)
GFR, Estimated: 35 mL/min — ABNORMAL LOW (ref >60)
Glucose, Bld: 112 mg/dL — ABNORMAL HIGH (ref 70–99)
Potassium: 3.5 mmol/L (ref 3.5–5.1)
Sodium: 138 mmol/L (ref 135–145)

## 2024-06-02 LAB — CBC
HCT: 41.4 % (ref 39.0–52.0)
Hemoglobin: 14.2 g/dL (ref 13.0–17.0)
MCH: 33 pg (ref 26.0–34.0)
MCHC: 34.3 g/dL (ref 30.0–36.0)
MCV: 96.3 fL (ref 80.0–100.0)
Platelets: 168 K/uL (ref 150–400)
RBC: 4.3 MIL/uL (ref 4.22–5.81)
RDW: 12.7 % (ref 11.5–15.5)
WBC: 4.6 K/uL (ref 4.0–10.5)
nRBC: 0 % (ref 0.0–0.2)

## 2024-06-02 MED ORDER — VALACYCLOVIR HCL 500 MG PO TABS
1000.0000 mg | ORAL_TABLET | Freq: Two times a day (BID) | ORAL | Status: DC
  Administered 2024-06-02 – 2024-06-07 (×10): 1000 mg via ORAL
  Filled 2024-06-02 (×10): qty 2

## 2024-06-02 NOTE — Progress Notes (Signed)
 RLE venous duplex has been completed.   Results can be found under chart review under CV PROC. 06/02/2024 11:45 AM Alliyah Roesler RVT, RDMS

## 2024-06-02 NOTE — Progress Notes (Signed)
 PROGRESS NOTE  [name]  DOB: 24-Feb-1941  PCP: Aisha Harvey, MD FMW:[id number]  DOA: 06/01/2024  LOS: 1 day  Hospital Day: 2  Brief narrative: [name] is a 83 y.o. male with PMH significant for obesity, OSA on CPAP, prediabetes, HTN, HLD, CKD, chronic back pain, chronic venous insufficiency, GERD, BPH, anxiety, depression Patient lives at home alone, able to ambulate independently, sometimes uses a walker.   7/21, patient was seen in the ED for few episodes of intermittent explosive diarrhea for 7 days.  Also reported rash on his right thigh.  He was noted to have right thigh shingles, started on Valtrex .  CT abdomen showed colonic diverticulosis without diverticulitis.  FOBT was positive but hemoglobin was normal.  Patient was discharged home on Valtrex . 7/23, patient returned back to the ED again with same symptoms.  He stated that the sores in his thigh have started to open up and the redness has spread.  He had apparently not been able to pick up the prescription for Valtrex .  Also reported diarrhea persistent.  In the ED, patient was afebrile, medically stable, breathing on room air. Labs showed WC count of 4, hemoglobin 13.2 stable, potassium low at 2.9, creatinine 1.98 EKG showed 89 bpm, QTc 482 ms. He was started on IV acyclovir , IV Ancef  Admitted to [facility]   Subjective: Patient was seen and examined this morning.  Pleasant elderly Caucasian male.  Propped up in bed.  Reports back pain this morning from being uncomfortable in bed.  Had 1 episode of diarrhea earlier.  Describes it as orients to yellow without any blood in it. Right thigh picture taken Chart reviewed.  Afebrile, hemodynamically stable No labs this morning    Assessment and plan: Acute right thigh cellulitis Shingles Patient has herpetic lesions on the right upper thigh distributed in a dermatomal pattern.  Also has surrounding area of cellulitis that extends down to the knee. Agree with use of both  antiviral and antibiotics. Continue IV Ancef  and IV acyclovir . Picture attached Continue to monitor Recent Labs  Lab 05/30/24 2124 06/01/24 1535 06/01/24 2009  WBC 5.7 4.0  --   LATICACIDVEN  --   --  1.4   Acute diarrhea Reports intermittent explosive diarrhea for last 1 week.  Had 1 this morning. Continue to monitor C. difficile and GI pathogen panel ordered  Hypokalemia Secondary to diarrheal loss Replacement given.  Continue to monitor. Recent Labs  Lab 05/30/24 2124 06/01/24 1535  K 3.7 2.9*  MG  --  1.8    Chronic back pain Secondary to degenerative disc disease No new weakness. Continue pain management with Neurontin , Norco, Robaxin , Tylenol  as needed   Chronic venous insufficiency R>L Obtain ultrasound duplex to rule out DVT  PTA meds- Lasix  40 mg daily    Right shoulder dislocation Generalized debility Patient may benefit from physical therapy and Occupational Therapy evaluation during the course of stay.  HLD Crestor   GERD Continue PPI   BPH Continue tamsulosin   Morbid Obesity  Body mass index is 36.17 kg/m. Patient has been advised to make an attempt to improve diet and exercise patterns to aid in weight loss.  OSA CPAP  Anxiety/depression Bupropion , Remeron    Mobility: PT eval  Goals of care   Code Status: Full Code     DVT prophylaxis:  enoxaparin  (LOVENOX ) injection 40 mg Start: 06/02/24 1000 SCDs Start: 06/01/24 2247   Antimicrobials: Acyclovir , Ancef  Fluid: LR at 50 mL/h Consultants: None Family Communication: None at bedside  Status: Inpatient Level of care:  Telemetry currently.  Upgrade to medical telemetry because of low electrolytes  Patient is from: Home Needs to continue in-hospital care: Needs management of diarrhea Anticipated d/c to: Pending clinical course and PT recommendation       Diet:  Diet Order             Diet Heart Room service appropriate? Yes; Fluid consistency: Thin  Diet effective now                    Scheduled Meds:  buPROPion   450 mg Oral q morning   enoxaparin  (LOVENOX ) injection  40 mg Subcutaneous Q24H   fluticasone   2 spray Each Nare QPM   gabapentin   600 mg Oral QHS   lactobacillus  1 g Oral TID WC   mirtazapine   15 mg Oral QHS   montelukast   10 mg Oral QHS   pantoprazole   40 mg Oral BID AC   potassium chloride  SA  40 mEq Oral BID   rosuvastatin   5 mg Oral QHS   tamsulosin   0.4 mg Oral Daily    PRN meds: acetaminophen  **OR** acetaminophen , hydrALAZINE , HYDROcodone -acetaminophen , meclizine , methocarbamol    Infusions:   acyclovir  (ZOVIRAX ) 700 mg in dextrose  5 % 100 mL IVPB 700 mg (06/02/24 1121)    ceFAZolin  (ANCEF ) IV Stopped (06/02/24 0602)   lactated ringers  50 mL/hr at 06/01/24 2100    Antimicrobials: Anti-infectives (From admission, onward)    Start     Dose/Rate Route Frequency Ordered Stop   06/02/24 0100  ceFAZolin  (ANCEF ) IVPB 2g/100 mL premix        2 g 200 mL/hr over 30 Minutes Intravenous Every 8 hours 06/01/24 2337 06/08/24 2159   06/01/24 2100  acyclovir  (ZOVIRAX ) 700 mg in dextrose  5 % 100 mL IVPB  Status:  Discontinued        700 mg 114 mL/hr over 60 Minutes Intravenous Every 8 hours 06/01/24 1943 06/01/24 1953   06/01/24 2100  acyclovir  (ZOVIRAX ) 700 mg in dextrose  5 % 100 mL IVPB        700 mg 114 mL/hr over 60 Minutes Intravenous Every 12 hours 06/01/24 1953     06/01/24 2000  vancomycin  (VANCOREADY) IVPB 1500 mg/300 mL  Status:  Discontinued        1,500 mg 150 mL/hr over 120 Minutes Intravenous Every 48 hours 06/01/24 1940 06/01/24 2249       Objective: Vitals:   06/02/24 0931 06/02/24 1130  BP:  112/63  Pulse:  72  Resp:  14  Temp: 98 F (36.7 C)   SpO2:  96%   No intake or output data in the 24 hours ending 06/02/24 1142 Filed Weights   06/01/24 1533  Weight: 111.1 kg   Weight change:  Body mass index is 36.17 kg/m.   Physical Exam: General exam: Pleasant, elderly Caucasian male. Skin: No rashes,  lesions or ulcers. HEENT: Atraumatic, normocephalic, no obvious bleeding Lungs: Clear to auscultation bilaterally,  CVS: S1, S2, no murmur,   GI/Abd: Soft, nontender, nondistended, bowel sound present,   CNS: Alert, awake, oriented x 3 Psychiatry: Mood appropriate Extremities: Herpetic lesions noted on the inner aspect of right thigh with area of cellulitis surrounding it, trace bilateral pedal edema right more than left.    Data Review: I have personally reviewed the laboratory data and studies available.  F/u labs  Unresulted Labs (From admission, onward)     Start     Ordered   06/08/24  0500  Creatinine, serum  (enoxaparin  (LOVENOX )    CrCl >/= 30 ml/min)  Weekly,   R     Comments: while on enoxaparin  therapy    06/01/24 2249   06/03/24 0500  Basic metabolic panel with GFR  Tomorrow morning,   R        06/02/24 1142   06/03/24 0500  CBC with Differential/Platelet  Tomorrow morning,   R        06/02/24 1142   06/02/24 1142  C Difficile Quick Screen w PCR reflex  (C Difficile quick screen w PCR reflex panel )  Once, for 24 hours,   TIMED       References:    CDiff Information Tool   06/02/24 1141   06/02/24 1141  Gastrointestinal Panel by PCR , Stool  (Gastrointestinal Panel by PCR, Stool                                                                                                                                                     **Does Not include CLOSTRIDIUM DIFFICILE testing. **If CDIFF testing is needed, place order from the C Difficile Testing order set.**)  Once,   R        06/02/24 1141   06/02/24 0803  Basic metabolic panel with GFR  ONCE - STAT,   STAT        06/02/24 0802   06/02/24 0803  CBC  ONCE - STAT,   STAT        06/02/24 0802   06/01/24 1928  Blood culture (routine x 2)  BLOOD CULTURE X 2,   R      06/01/24 1929            Signed, Chapman Rota, MD [facility] 06/02/2024

## 2024-06-02 NOTE — ED Notes (Signed)
 Pt attempted for third time today to have bowel movement on BSC with no results, just feels gas.

## 2024-06-03 DIAGNOSIS — L03115 Cellulitis of right lower limb: Secondary | ICD-10-CM | POA: Diagnosis not present

## 2024-06-03 LAB — CBC WITH DIFFERENTIAL/PLATELET
Abs Immature Granulocytes: 0.01 K/uL (ref 0.00–0.07)
Basophils Absolute: 0 K/uL (ref 0.0–0.1)
Basophils Relative: 0 %
Eosinophils Absolute: 0.2 K/uL (ref 0.0–0.5)
Eosinophils Relative: 5 %
HCT: 36.6 % — ABNORMAL LOW (ref 39.0–52.0)
Hemoglobin: 12.4 g/dL — ABNORMAL LOW (ref 13.0–17.0)
Immature Granulocytes: 0 %
Lymphocytes Relative: 25 %
Lymphs Abs: 1 K/uL (ref 0.7–4.0)
MCH: 32.7 pg (ref 26.0–34.0)
MCHC: 33.9 g/dL (ref 30.0–36.0)
MCV: 96.6 fL (ref 80.0–100.0)
Monocytes Absolute: 0.6 K/uL (ref 0.1–1.0)
Monocytes Relative: 16 %
Neutro Abs: 2.2 K/uL (ref 1.7–7.7)
Neutrophils Relative %: 54 %
Platelets: 140 K/uL — ABNORMAL LOW (ref 150–400)
RBC: 3.79 MIL/uL — ABNORMAL LOW (ref 4.22–5.81)
RDW: 12.7 % (ref 11.5–15.5)
WBC: 4 K/uL (ref 4.0–10.5)
nRBC: 0 % (ref 0.0–0.2)

## 2024-06-03 LAB — BASIC METABOLIC PANEL WITH GFR
Anion gap: 11 (ref 5–15)
BUN: 16 mg/dL (ref 8–23)
CO2: 28 mmol/L (ref 22–32)
Calcium: 8.9 mg/dL (ref 8.9–10.3)
Chloride: 101 mmol/L (ref 98–111)
Creatinine, Ser: 1.79 mg/dL — ABNORMAL HIGH (ref 0.61–1.24)
GFR, Estimated: 37 mL/min — ABNORMAL LOW (ref >60)
Glucose, Bld: 116 mg/dL — ABNORMAL HIGH (ref 70–99)
Potassium: 3.5 mmol/L (ref 3.5–5.1)
Sodium: 140 mmol/L (ref 135–145)

## 2024-06-03 MED ORDER — ALUM & MAG HYDROXIDE-SIMETH 200-200-20 MG/5ML PO SUSP
30.0000 mL | Freq: Four times a day (QID) | ORAL | Status: DC | PRN
  Administered 2024-06-03: 30 mL via ORAL
  Filled 2024-06-03: qty 30

## 2024-06-03 NOTE — Evaluation (Signed)
 Physical Therapy Evaluation Patient Details Name: [name] MRN: [id number] DOB: 01-15-1941 Today's Date: 06/03/2024  History of Present Illness  [name] is an 83 yo male with explosive diarrhea. 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 Reports recent hx of fall with R shoulder dislocation and visits for LE una boot management at [facility]. Currently, patient presents with deficits outlined below most significantly decreased R shoulder ROM, R foot drop, decreased balance and activity tolerance, limited insight into strategies to compensate for deficits toward independence and safety (R LE bracing, AE/DME, driving accommodations) and decreased skin integrity limiting functional ADL's and mobility performance. Patient requires continued Acute care hospital level PT services to maximize IND and safety prior to return home with limited assist. PT recommending HHPT  for follow up to maximize pt safety and functionality in home setting.     p  If plan is discharge home, recommend the following: A little help with walking and/or transfers;A little help with bathing/dressing/bathroom;Assistance with cooking/housework;Assist for transportation;Help with stairs or ramp for entrance   Can travel by private vehicle        Equipment Recommendations None recommended by PT  Recommendations for Other Services       Functional Status Assessment Patient has had a recent decline in their functional status and demonstrates the ability to make significant improvements in function in a reasonable and  predictable amount of time.     Precautions / Restrictions Precautions Precautions: Fall Precaution/Restrictions Comments: Noted foot drop with steppage gait on R Restrictions Weight Bearing Restrictions Per Provider Order: No      Mobility  Bed Mobility Overal bed mobility: Needs Assistance Bed Mobility: Supine to Sit     Supine to sit: Min assist     General bed mobility comments: Increased time with use of bed rail and min assist to bring trunk to upright; Pt states more to hold onto at his residence    Transfers Overall transfer level: Needs assistance Equipment used: Rolling walker (2 wheels) Transfers: Sit to/from Stand Sit to Stand: Contact guard assist           General transfer comment: For safety, pt semi-lunges from beside to standing with RW    Ambulation/Gait Ambulation/Gait assistance: Contact guard assist Gait Distance (Feet): 120 Feet Assistive device: Rolling walker (2 wheels) Gait Pattern/deviations: Step-to pattern, Step-through pattern, Steppage, Shuffle, Trunk flexed Gait velocity: decr     General Gait Details: cues for posture and position from RW; noted steppage gait with near loss of R shoe  Stairs            Wheelchair Mobility     Tilt Bed    Modified Rankin (Stroke Patients Only)       Balance Overall balance assessment: Needs assistance Sitting-balance support: Feet supported, No upper extremity supported Sitting balance-Leahy Scale: Good     Standing balance support: No upper extremity supported Standing balance-Leahy Scale: Fair  Pertinent Vitals/Pain Pain Assessment Pain Assessment: No/denies pain    Home Living Family/patient expects to be discharged to:: Private residence Living Arrangements: Alone Available Help at Discharge: Family;Friend(s);Available PRN/intermittently Type of Home: Other(Comment) Arts administrator, 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, toilet/sink  only) Home Access: Stairs to enter Entrance Stairs-Rails: Right Entrance Stairs-Number of Steps: 3   Home Layout: One level Home Equipment: Cane - single point;Adaptive equipment Additional Comments: reports he places items in walkways of space for support    Prior Function Prior Level of Function : Independent/Modified Independent;Working/employed;Driving             Mobility Comments: Pt reports he's ambulatory with SPC, occasional use of RW and relies on furniture, walls etc.  Pt admits to one recent fall attempting to retrieve a reciept that had fallen; still driving. ADLs Comments: Showers at [employer] and performs ltd meal prep with microwave     Extremity/Trunk Assessment   Upper Extremity Assessment Upper Extremity Assessment: Right hand dominant;RUE deficits/detail;LUE deficits/detail RUE Deficits / Details: recent hx of fall with r shoulder dislocation and reset at [facility], remains with limited A/PROM 0-75 degrees LUE Deficits / Details: lawnmower injury with L 2nd and 3rd digit PIP amputation at 83 yo LUE Coordination: decreased fine motor    Lower Extremity Assessment Lower Extremity Assessment: Defer to PT evaluation RLE Deficits / Details: Noted foot drop with steppage gait during ambulation.  Pt reports as result of prior back injury and surgery    Cervical / Trunk Assessment Cervical / Trunk Assessment: Kyphotic  Communication   Communication Communication: No apparent difficulties    Cognition Arousal: Alert Behavior During Therapy: WFL for tasks assessed/performed, Impulsive   PT - Cognitive impairments: Safety/Judgement, No family/caregiver present to determine baseline                         Following commands: Intact       Cueing Cueing Techniques: Verbal cues     General Comments General comments (skin integrity, edema, etc.): reports recent hx of need for una boot application at [facility], had been using compression hose prior, has  open ulceration R shin, residual edema B LE's    Exercises     Assessment/Plan    PT Assessment Patient needs continued PT services  PT Problem List Decreased strength;Decreased activity tolerance;Decreased balance;Decreased mobility;Decreased knowledge of use of DME;Obesity       PT Treatment Interventions DME instruction;Gait training;Stair training;Functional mobility training;Therapeutic activities;Therapeutic exercise;Patient/family education    PT Goals (Current goals can be found in the Care Plan section)  Acute Rehab PT Goals Patient Stated Goal: REsume previous lifestyle PT Goal Formulation: With patient Time For Goal Achievement: 06/17/24 Potential to Achieve Goals: Good    Frequency Min 3X/week     Co-evaluation PT/OT/SLP Co-Evaluation/Treatment: Yes Reason for Co-Treatment: To address functional/ADL transfers PT goals addressed during session: Mobility/safety with mobility OT goals addressed during session: ADL's and self-care       AM-PAC PT 6 Clicks Mobility  Outcome Measure Help needed turning from your back to your side while in a flat bed without using bedrails?: A Little Help needed moving from lying on your back to sitting on the side of a flat bed without using bedrails?: A Little Help needed moving to and from a bed to a chair (including a wheelchair)?: A Little Help needed standing up from a chair using your arms (e.g., wheelchair or bedside chair)?: A Little Help needed  to walk in hospital room?: A Little Help needed climbing 3-5 steps with a railing? : A Little 6 Click Score: 18    End of Session Equipment Utilized During Treatment: Gait belt Activity Tolerance: Patient tolerated treatment well Patient left: in chair;with call bell/phone within reach;with chair alarm set Nurse Communication: Mobility status PT Visit Diagnosis: Difficulty in walking, not elsewhere classified (R26.2);Muscle weakness (generalized) (M62.81)    Time:  8860-8787 PT Time Calculation (min) (ACUTE ONLY): 33 min   Charges:   PT Evaluation $PT Eval Low Complexity: 1 Low   PT General Charges $$ ACUTE PT VISIT: 1 Visit         [facility] PT Acute Rehabilitation Services Office [phone]   Afton Lavalle 06/03/2024, 3:59 PM

## 2024-06-03 NOTE — Progress Notes (Signed)
 PROGRESS NOTE  [name]  DOB: December 05, 1940  PCP: Aisha Harvey, MD FMW:[id number]  DOA: 06/01/2024  LOS: 2 days  Hospital Day: 3  Brief narrative: [name] is a 83 y.o. male with PMH significant for obesity, OSA on CPAP, prediabetes, HTN, HLD, CKD, chronic back pain, chronic venous insufficiency, GERD, BPH, anxiety, depression Patient lives at home alone, able to ambulate independently, sometimes uses a walker.   7/21, patient was seen in the ED for few episodes of intermittent explosive diarrhea for 7 days.  Also reported rash on his right thigh.  He was noted to have right thigh shingles, started on Valtrex .  CT abdomen showed colonic diverticulosis without diverticulitis.  FOBT was positive but hemoglobin was normal.  Patient was discharged home on Valtrex . 7/23, patient returned back to the ED again with same symptoms.  He stated that the sores in his thigh have started to open up and the redness has spread.  He had apparently not been able to pick up the prescription for Valtrex .  Also reported diarrhea persistent.  In the ED, patient was afebrile, medically stable, breathing on room air. Labs showed WC count of 4, hemoglobin 13.2 stable, potassium low at 2.9, creatinine 1.98 EKG showed 89 bpm, QTc 482 ms. He was started on IV acyclovir , IV Ancef  Admitted to [facility]   Subjective: Patient was seen and examined this morning.. Lying in bed.  Has not had any diarrhea since yesterday.  Passing gas. Reports some pain in upper back yesterday when his back was pressed over the remote control of the TV. Chart reviewed Remains hemodynamically stable, breathing on room air Labs from this morning with creatinine stable, potassium and WBC count normal  Assessment and plan: Acute right thigh cellulitis Shingles Patient has herpetic lesions on the right upper thigh distributed in a dermatomal pattern.  Also has surrounding area of cellulitis that extends down to the knee. Currently on  IV Ancef  and IV acyclovir . Herpetic lesions are intact.  Cellulitis improving.  Continue to monitor Recent Labs  Lab 05/30/24 2124 06/01/24 1535 06/01/24 2009 06/02/24 1637 06/03/24 0334  WBC 5.7 4.0  --  4.6 4.0  LATICACIDVEN  --   --  1.4  --   --    Acute diarrhea Reported intermittent explosive diarrhea for last 1 week.  BM was yesterday morning. Continue to monitor C. difficile and GI pathogen panel ordered but not collected yet.  Hypokalemia Secondary to diarrheal loss Replacement given. Continue to monitor. Recent Labs  Lab 05/30/24 2124 06/01/24 1535 06/02/24 1637 06/03/24 0334  K 3.7 2.9* 3.5 3.5  MG  --  1.8  --   --     Chronic back pain Secondary to degenerative disc disease No new weakness. Continue pain management with Neurontin , Norco, Robaxin , Tylenol  as needed   Chronic venous insufficiency R>L Duplex is negative for DVT PTA meds- Lasix  40 mg daily   Right shoulder dislocation Generalized debility PT/OT eval ordered  HLD Crestor   GERD Continue PPI, Mylanta   BPH Continue tamsulosin   Morbid Obesity  Body mass index is 36.17 kg/m. Patient has been advised to make an attempt to improve diet and exercise patterns to aid in weight loss.  OSA CPAP  Anxiety/depression Bupropion , Remeron    Mobility: PT eval  Goals of care   Code Status: Full Code     DVT prophylaxis:  enoxaparin  (LOVENOX ) injection 40 mg Start: 06/02/24 1000 SCDs Start: 06/01/24 2247   Antimicrobials: Acyclovir , Ancef  Fluid: Can stop  IV fluid  Consultants: None Family Communication: None at bedside  Status: Inpatient Level of care:  Telemetry currently.    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  He states he does not have bathroom there. Needs to continue in-hospital care: Pending PT eval Anticipated d/c to: Pending clinical course and PT recommendation     Diet:  Diet Order              Diet Heart Room service appropriate? Yes; Fluid consistency: Thin  Diet effective now                   Scheduled Meds:  buPROPion   450 mg Oral q morning   enoxaparin  (LOVENOX ) injection  40 mg Subcutaneous Q24H   fluticasone   2 spray Each Nare QPM   gabapentin   600 mg Oral QHS   lactobacillus  1 g Oral TID WC   mirtazapine   15 mg Oral QHS   montelukast   10 mg Oral QHS   pantoprazole   40 mg Oral BID AC   potassium chloride  SA  40 mEq Oral BID   rosuvastatin   5 mg Oral QHS   tamsulosin   0.4 mg Oral Daily   valACYclovir   1,000 mg Oral BID    PRN meds: acetaminophen  **OR** acetaminophen , alum & mag hydroxide-simeth, hydrALAZINE , HYDROcodone -acetaminophen , meclizine , methocarbamol    Infusions:    ceFAZolin  (ANCEF ) IV 2 g (06/03/24 0611)    Antimicrobials: Anti-infectives (From admission, onward)    Start     Dose/Rate Route Frequency Ordered Stop   06/02/24 2200  valACYclovir  (VALTREX ) tablet 1,000 mg        1,000 mg Oral 2 times daily 06/02/24 1218 06/09/24 2159   06/02/24 0100  ceFAZolin  (ANCEF ) IVPB 2g/100 mL premix        2 g 200 mL/hr over 30 Minutes Intravenous Every 8 hours 06/01/24 2337 06/08/24 2159   06/01/24 2100  acyclovir  (ZOVIRAX ) 700 mg in dextrose  5 % 100 mL IVPB  Status:  Discontinued        700 mg 114 mL/hr over 60 Minutes Intravenous Every 8 hours 06/01/24 1943 06/01/24 1953   06/01/24 2100  acyclovir  (ZOVIRAX ) 700 mg in dextrose  5 % 100 mL IVPB  Status:  Discontinued        700 mg 114 mL/hr over 60 Minutes Intravenous Every 12 hours 06/01/24 1953 06/02/24 1218   06/01/24 2000  vancomycin  (VANCOREADY) IVPB 1500 mg/300 mL  Status:  Discontinued        1,500 mg 150 mL/hr over 120 Minutes Intravenous Every 48 hours 06/01/24 1940 06/01/24 2249       Objective: Vitals:   06/03/24 0218 06/03/24 0605  BP: 138/76 (!) 169/82  Pulse: 73 78  Resp: 18 18  Temp: 98.2 F (36.8 C) 97.9 F (36.6 C)  SpO2: 95% 92%    Intake/Output Summary  (Last 24 hours) at 06/03/2024 1039 Last data filed at 06/03/2024 0845 Gross per 24 hour  Intake 1032.83 ml  Output 850 ml  Net 182.83 ml   Filed Weights   06/01/24 1533  Weight: 111.1 kg   Weight change:  Body mass index is 36.17 kg/m.   Physical Exam: General exam: Pleasant, elderly Caucasian male. Skin: No rashes, lesions or ulcers. HEENT: Atraumatic, normocephalic, no obvious bleeding Lungs: Clear to auscultation bilaterally,  CVS: S1, S2, no murmur,   GI/Abd: Soft, nontender, nondistended, bowel sound present,  CNS: Alert, awake, oriented x 3 Psychiatry: Mood appropriate Extremities: Herpetic lesions intact, cellulitis improving.  Swelling improving.    Data Review: I have personally reviewed the laboratory data and studies available.  F/u labs  Unresulted Labs (From admission, onward)     Start     Ordered   06/08/24 0500  Creatinine, serum  (enoxaparin  (LOVENOX )    CrCl >/= 30 ml/min)  Weekly,   R     Comments: while on enoxaparin  therapy    06/01/24 2249   06/04/24 0500  Basic metabolic panel with GFR  Tomorrow morning,   R        06/03/24 1039   06/04/24 0500  CBC with Differential/Platelet  Tomorrow morning,   R        06/03/24 1039   06/02/24 1142  C Difficile Quick Screen w PCR reflex  (C Difficile quick screen w PCR reflex panel )  Once, for 24 hours,   TIMED       References:    CDiff Information Tool   06/02/24 1141   06/02/24 1141  Gastrointestinal Panel by PCR , Stool  (Gastrointestinal Panel by PCR, Stool                                                                                                                                                     **Does Not include CLOSTRIDIUM DIFFICILE testing. **If CDIFF testing is needed, place order from the C Difficile Testing order set.**)  Once,   R        06/02/24 1141            Signed, Chapman Rota, MD [facility] 06/03/2024

## 2024-06-03 NOTE — TOC Initial Note (Addendum)
 Transition of Care [facility]) - Initial/Assessment Note    Patient Details  Name: [name] MRN: [id number] Date of Birth: Feb 13, 1941  Transition of Care [facility]) CM/SW Contact:    Alfonse JONELLE Rex, RN Phone Number: 06/03/2024, 10:54 AM  Clinical Narrative:  Patient with PCP on file, resides alone, uses RW as needed but mostly independent, has family member's on file as contacts.  PT eval pending, await recommendation.   -4:24pm PT eval completed, recommendation for [facility] PT/OT, pt declined states is home will not accommodate [facility] services, patient agreeable to OPPT, confirmed OPPT facility on [facility] is the closed. Ambulatory PT referral entered.   Expected Discharge Plan: Home w Home Health Services Barriers to Discharge: Continued Medical Work up   Patient Goals and CMS Choice Patient states their goals for this hospitalization and ongoing recovery are:: return home          Expected Discharge Plan and Services       Living arrangements for the past 2 months: Single Family Home                                      Prior Living Arrangements/Services Living arrangements for the past 2 months: Single Family Home Lives with:: Self Patient language and need for interpreter reviewed:: Yes Do you feel safe going back to the place where you live?: Yes      Need for Family Participation in Patient Care: Yes (Comment) Care giver support system in place?: Yes (comment) Current home services: DME (RW,  CPAP) Criminal Activity/Legal Involvement Pertinent to Current Situation/Hospitalization: No - Comment as needed  Activities of Daily Living   ADL Screening (condition at time of admission) Independently performs ADLs?: Yes (appropriate for developmental age) Is the patient deaf or have difficulty hearing?: No Does the patient have difficulty seeing, even when wearing glasses/contacts?: No Does the patient have difficulty concentrating, remembering, or making  decisions?: No  Permission Sought/Granted                  Emotional Assessment       Orientation: : Oriented to Self, Oriented to Place, Oriented to  Time, Oriented to Situation Alcohol / Substance Use: Not Applicable Psych Involvement: No (comment)  Admission diagnosis:  Cellulitis [L03.90] 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   Aspiration pneumonia (HCC) 04/07/2023   Asthma 04/07/2023   Acute respiratory failure with hypoxia (HCC) 11/18/2022   OSA (obstructive sleep apnea) 11/18/2022   Pulmonary nodules 11/18/2022   Blood in stool 11/18/2022   Influenza A with pneumonia 11/13/2022   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/2022   Primary osteoarthritis of left hip 04/23/2022   Osteomyelitis of great toe of right foot (HCC) 04/18/2022   Left hip pain 04/09/2022   Upper airway cough syndrome 02/03/2022   Acute rhinosinusitis 01/24/2022   Hypertension 09/30/2021   Allergic rhinitis due to pollen 03/25/2021   Benign prostatic hyperplasia with lower urinary tract symptoms 03/25/2021   Chronic fatigue syndrome 03/25/2021   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   Stage 3b chronic kidney disease (CKD) (HCC) 03/25/2021   Chronic pain 03/25/2021   Dyslipidemia 03/25/2021   Edema 03/25/2021   Generalized anxiety disorder 03/25/2021   Mild persistent asthma 03/25/2021   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   Obesity (BMI 30-39.9)  03/25/2021   Non-pressure chronic ulcer of other part of right foot limited to breakdown of skin (HCC) 03/25/2021   Prediabetes 03/25/2021   Primary insomnia 03/25/2021   Sleep disturbance 03/25/2021   Paresthesia of skin 06/08/2020   Right foot drop 06/08/2020   Body mass index (BMI) 35.0-35.9, adult 03/13/2020   Spondylosis of cervical region without myelopathy or radiculopathy 01/05/2020   Elevated blood-pressure reading, without  diagnosis of hypertension 12/07/2019   Spinal stenosis of lumbar region with neurogenic claudication 01/15/2017   Spondylolisthesis of lumbar region 01/15/2017   Claw toe, acquired, left 01/06/2017   Achilles tendon contracture, bilateral 01/06/2017   Pain in metatarsus of both feet 01/06/2017   Tubular adenoma of colon 12/29/2016   Basal cell carcinoma 08/19/2016   Umbilical hernia s/p lap repair with mesh 06/27/2016 06/27/2016   Degenerative arthritis of knee 03/22/2015   Dyspnea on exertion 01/31/2015   Lumbar radiculopathy 12/12/2014   Facet hypertrophy of lumbar region 12/12/2014   Left knee pain 12/12/2014   Ulcer of lower limb (HCC) 01/10/2014   Varicose veins of bilateral lower extremities with other complications 01/10/2014   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, [place] - [address] [place] [address] Phone: [phone] Fax: [phone]  [id number] - [place], [place] - [address] AT [facility] [address] [place] [address] Phone: [phone] Fax: [phone]  [place] [facility] [address] [place] [address] Phone: [phone] Fax: [phone]     Social Drivers of Health (SDOH) Social History: SDOH Screenings   Food Insecurity: No Food Insecurity (06/02/2024)  Housing: Low Risk  (06/02/2024)  Transportation Needs: No Transportation Needs (06/02/2024)  Utilities: Not At Risk (06/02/2024)  Depression (PHQ2-9): Medium Risk (08/09/2020)  Social Connections: Moderately  Integrated (06/02/2024)  Tobacco Use: Low Risk  (06/01/2024)   SDOH Interventions:     Readmission Risk Interventions    06/03/2024   10:51 AM 04/10/2023   12:01 PM 11/20/2022   12:16 PM  Readmission Risk Prevention Plan  Transportation Screening Complete Complete   PCP or Specialist Appt within 5-7 Days Complete    Home Care Screening Complete    Medication Review (RN CM) Complete    Medication Review (RN Care Manager)  Complete   PCP or Specialist appointment within 3-5 days of discharge  Complete Complete  HRI or Home Care Consult  Complete Complete  SW Recovery Care/Counseling Consult  Complete Complete  Palliative Care Screening  Not Applicable Not Applicable  Skilled Nursing Facility  Not Applicable Not Applicable

## 2024-06-03 NOTE — Evaluation (Addendum)
 Occupational Therapy Evaluation Patient Details Name: [name] MRN: [id number] DOB: Aug 03, 1941 Today's Date: 06/03/2024   History of Present Illness   [name] is an 83 yo male with explosive diarrhea. 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 Reports recent hx of fall with R shoulder dislocation and visits for LE una boot management at [facility]. Currently, patient presents with deficits outlined below (see OT Problem List for details) most significantly decreased R shoulder ROM, R foot drop, decreased balance and activity tolerance, limited insight into strategies to compensate for deficits toward independence and safety(R LE bracing, AE/DME, driving accommodations) and decreased skin integrity limiting functional ADL's and mobility performance. Patient requires continued Acute care hospital level OT services to progress safety and functional performance, R shoulder HEP, AE training and allow for safe discharge. OT recommending HHOT services once medically stable. May require additional hired caregivers initially as needed.       If plan is discharge home, recommend the following:   A little help with walking and/or transfers;A little help with bathing/dressing/bathroom;Assistance with cooking/housework;Assist for transportation;Help with stairs or ramp for entrance     Functional Status Assessment   Patient has had a recent decline in their functional status and demonstrates the ability to make significant improvements in function in a reasonable and predictable amount of  time.     Equipment Recommendations   Other (comment) (OT will provide with info to obtain long handled sponge, sock aide as needed)      Precautions/Restrictions   Precautions Precautions: Fall Precaution/Restrictions Comments: Noted foot drop with steppage gait on R Restrictions Weight Bearing Restrictions Per Provider Order: No     Mobility Bed Mobility Overal bed mobility: Needs Assistance Bed Mobility: Supine to Sit     Supine to sit: Min assist     General bed mobility comments: Increased time with use of bed rail and min assist to bring trunk to upright; Pt states more to hold onto at his residence    Transfers Overall transfer level: Needs assistance Equipment used: Rolling walker (2 wheels) Transfers: Sit to/from Stand, Bed to chair/wheelchair/BSC Sit to Stand: Contact guard assist, From elevated surface     Step pivot transfers: Contact guard assist, From elevated surface     General transfer comment: unable to push to stand, uses momentum to rise from elevated surface grabbing RW,      Balance Overall balance assessment: Needs assistance Sitting-balance support: Feet supported, No upper extremity supported Sitting balance-Leahy Scale: Good     Standing balance support: No upper extremity supported Standing balance-Leahy Scale: Fair                             ADL either performed or assessed with clinical judgement   ADL Overall ADL's : Needs assistance/impaired Eating/Feeding: Independent   Grooming: Wash/dry hands;Wash/dry face;Sitting;Modified independent   Upper Body Bathing: Modified independent;Sitting   Lower Body Bathing: Minimal assistance;Sit to/from stand Lower Body Bathing Details (indicate cue type and reason): decreased reach to feet Upper Body Dressing : Modified  independent;Sitting   Lower Body Dressing: Minimal assistance;Sit to/from stand Lower Body Dressing Details (indicate cue type and reason): increased  time and effort for functional reach to socks and slip on crocs with increased abdominal body habitus Toilet Transfer: Contact guard assist;Grab bars;Rolling walker (2 wheels)   Toileting- Clothing Manipulation and Hygiene: Set up;Sit to/from stand Toileting - Clothing Manipulation Details (indicate cue type and reason): urianl use     Functional mobility during ADLs: Contact guard assist;Rolling walker (2 wheels) (R foot drop with steppage gait, no brace nor quality shoe besides slip on croc) General ADL Comments: effort and limitations for LB self care, skin issues distal LE's     Vision Baseline Vision/History: 0 No visual deficits Ability to See in Adequate Light: 0 Adequate Patient Visual Report: No change from baseline Vision Assessment?: No apparent visual deficits            Pertinent Vitals/Pain Pain Assessment Pain Assessment: No/denies pain     Extremity/Trunk Assessment Upper Extremity Assessment Upper Extremity Assessment: Right hand dominant;RUE deficits/detail;LUE deficits/detail RUE Deficits / Details: recent hx of fall with r shoulder dislocation and reset at [facility], remains with limited A/PROM 0-75 degrees LUE Deficits / Details: lawnmower injury with L 2nd and 3rd digit PIP amputation LUE Coordination: decreased fine motor   Lower Extremity Assessment Lower Extremity Assessment: Defer to PT evaluation RLE Deficits / Details: Noted foot drop with steppage gait during ambulation.  Pt reports as result of prior back injury and surgery   Cervical / Trunk Assessment Cervical / Trunk Assessment: Kyphotic   Communication Communication Communication: No apparent difficulties   Cognition Arousal: Alert Behavior During Therapy: WFL for tasks assessed/performed, Impulsive Cognition: No apparent impairments             OT - Cognition Comments: decreased insight into strategies that could assist with deficits                 Following commands: Intact        Cueing  General Comments   Cueing Techniques: Verbal cues  reports recent hx of need for una boot application at [facility], had been using compression hose prior, has open ulceration R shin, residual edema B LE's           Home Living Family/patient expects to be discharged to:: Private residence Living Arrangements: Alone Available Help at Discharge: Family;Friend(s);Available PRN/intermittently Type of Home: Other(Comment) Arts administrator, 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, toilet/sink only) Home Access: Stairs to enter [employer] of Steps: 3 Entrance Stairs-Rails: Right Home Layout: One level     Bathroom Shower/Tub: Other (comment) ([place] [facility] `)   Bathroom Toilet: Standard Bathroom Accessibility: Yes   Home Equipment: Cane - single point   Additional Comments: reports he places items in walkways of space for support      Prior Functioning/Environment Prior Level of Function : Independent/Modified Independent;Working/employed;Driving             Mobility Comments: Pt reports he's ambulatory with SPC, occasional use of RW and relies on furniture, walls etc.  Pt admits to one recent fall attempting to retrieve a reciept that had fallen; still driving. ADLs Comments: [facility] at [employer] and performs ltd meal prep with microwave    OT Problem List: Decreased range of motion;Decreased activity tolerance;Impaired balance (sitting and/or standing);Obesity;Increased edema   OT Treatment/Interventions: Self-care/ADL training;Therapeutic exercise;Neuromuscular education;Energy conservation;DME and/or AE instruction;Manual therapy;Therapeutic activities;Patient/family education;Balance training      OT Goals(Current  goals can be found in the care plan section)   Acute Rehab OT Goals Patient Stated Goal: to feel better OT Goal Formulation: With patient Time For Goal Achievement: 06/17/24 Potential to Achieve Goals: Fair   OT Frequency:   Min 2X/week    Co-evaluation PT/OT/SLP Co-Evaluation/Treatment: Yes Reason for Co-Treatment: To address functional/ADL transfers PT goals addressed during session: Mobility/safety with mobility OT goals addressed during session: ADL's and self-care      AM-PAC OT 6 Clicks Daily Activity     Outcome Measure Help from another person eating meals?: None Help from another person taking care of personal grooming?: None Help from another person toileting, which includes using toliet, bedpan, or urinal?: A Little Help from another person bathing (including washing, rinsing, drying)?: A Little Help from another person to put on and taking off regular upper body clothing?: None Help from another person to put on and taking off regular lower body clothing?: A Little 6 Click Score: 21   End of Session Equipment Utilized During Treatment: Gait belt;Rolling walker (2 wheels) Nurse Communication: Mobility status  Activity Tolerance: Patient limited by fatigue Patient left: in chair;with call bell/phone within reach;with chair alarm set  OT Visit Diagnosis: Unsteadiness on feet (R26.81);Other abnormalities of gait and mobility (R26.89);History of falling (Z91.81)                Time: 8864-8779 OT Time Calculation (min): 45 min Charges:  OT General Charges $OT Visit: 1 Visit OT Evaluation $OT Eval Low Complexity: 1 Low OT Treatments $Therapeutic Activity: 8-22 mins  Hyrum Shaneyfelt OT/L Acute Rehabilitation Department  [phone]  06/03/2024, 3:28 PM

## 2024-06-04 ENCOUNTER — Inpatient Hospital Stay (HOSPITAL_COMMUNITY)

## 2024-06-04 DIAGNOSIS — L03115 Cellulitis of right lower limb: Secondary | ICD-10-CM | POA: Diagnosis not present

## 2024-06-04 LAB — CBC WITH DIFFERENTIAL/PLATELET
Abs Immature Granulocytes: 0.01 K/uL (ref 0.00–0.07)
Basophils Absolute: 0 K/uL (ref 0.0–0.1)
Basophils Relative: 1 %
Eosinophils Absolute: 0.3 K/uL (ref 0.0–0.5)
Eosinophils Relative: 5 %
HCT: 34.9 % — ABNORMAL LOW (ref 39.0–52.0)
Hemoglobin: 12.1 g/dL — ABNORMAL LOW (ref 13.0–17.0)
Immature Granulocytes: 0 %
Lymphocytes Relative: 30 %
Lymphs Abs: 1.4 K/uL (ref 0.7–4.0)
MCH: 33.5 pg (ref 26.0–34.0)
MCHC: 34.7 g/dL (ref 30.0–36.0)
MCV: 96.7 fL (ref 80.0–100.0)
Monocytes Absolute: 0.6 K/uL (ref 0.1–1.0)
Monocytes Relative: 13 %
Neutro Abs: 2.4 K/uL (ref 1.7–7.7)
Neutrophils Relative %: 51 %
Platelets: 145 K/uL — ABNORMAL LOW (ref 150–400)
RBC: 3.61 MIL/uL — ABNORMAL LOW (ref 4.22–5.81)
RDW: 12.6 % (ref 11.5–15.5)
WBC: 4.8 K/uL (ref 4.0–10.5)
nRBC: 0 % (ref 0.0–0.2)

## 2024-06-04 LAB — BASIC METABOLIC PANEL WITH GFR
Anion gap: 10 (ref 5–15)
BUN: 18 mg/dL (ref 8–23)
CO2: 27 mmol/L (ref 22–32)
Calcium: 8.8 mg/dL — ABNORMAL LOW (ref 8.9–10.3)
Chloride: 101 mmol/L (ref 98–111)
Creatinine, Ser: 1.66 mg/dL — ABNORMAL HIGH (ref 0.61–1.24)
GFR, Estimated: 41 mL/min — ABNORMAL LOW (ref >60)
Glucose, Bld: 105 mg/dL — ABNORMAL HIGH (ref 70–99)
Potassium: 3.5 mmol/L (ref 3.5–5.1)
Sodium: 138 mmol/L (ref 135–145)

## 2024-06-04 MED ORDER — VANCOMYCIN HCL 1750 MG/350ML IV SOLN
1750.0000 mg | INTRAVENOUS | Status: DC
  Administered 2024-06-06: 1750 mg via INTRAVENOUS
  Filled 2024-06-04: qty 350

## 2024-06-04 MED ORDER — FUROSEMIDE 40 MG PO TABS
40.0000 mg | ORAL_TABLET | Freq: Every day | ORAL | Status: DC
Start: 2024-06-04 — End: 2024-06-07
  Administered 2024-06-04 – 2024-06-07 (×4): 40 mg via ORAL
  Filled 2024-06-04 (×4): qty 1

## 2024-06-04 MED ORDER — METHOCARBAMOL 500 MG PO TABS
500.0000 mg | ORAL_TABLET | Freq: Three times a day (TID) | ORAL | Status: DC
  Administered 2024-06-04 – 2024-06-07 (×9): 500 mg via ORAL
  Filled 2024-06-04 (×9): qty 1

## 2024-06-04 MED ORDER — VANCOMYCIN HCL 2000 MG/400ML IV SOLN
2000.0000 mg | Freq: Once | INTRAVENOUS | Status: AC
  Administered 2024-06-04: 2000 mg via INTRAVENOUS
  Filled 2024-06-04: qty 400

## 2024-06-04 MED ORDER — SODIUM CHLORIDE 0.9 % IV SOLN
2.0000 g | Freq: Once | INTRAVENOUS | Status: AC
  Administered 2024-06-04: 2 g via INTRAVENOUS
  Filled 2024-06-04: qty 12.5

## 2024-06-04 MED ORDER — SODIUM CHLORIDE 0.9 % IV SOLN
2.0000 g | Freq: Two times a day (BID) | INTRAVENOUS | Status: DC
  Administered 2024-06-04 – 2024-06-06 (×4): 2 g via INTRAVENOUS
  Filled 2024-06-04 (×4): qty 12.5

## 2024-06-04 MED ORDER — HYDROCODONE-ACETAMINOPHEN 5-325 MG PO TABS
1.0000 | ORAL_TABLET | ORAL | Status: DC | PRN
  Administered 2024-06-04 – 2024-06-07 (×11): 1 via ORAL
  Filled 2024-06-04 (×12): qty 1

## 2024-06-04 NOTE — Plan of Care (Signed)
  Problem: Education: Goal: Knowledge of General Education information will improve Description: Including pain rating scale, medication(s)/side effects and non-pharmacologic comfort measures Outcome: Not Progressing   Problem: Health Behavior/Discharge Planning: Goal: Ability to manage health-related needs will improve Outcome: Not Progressing   Problem: Clinical Measurements: Goal: Ability to maintain clinical measurements within normal limits will improve Outcome: Not Progressing Goal: Will remain free from infection Outcome: Not Progressing Goal: Diagnostic test results will improve Outcome: Not Progressing Goal: Respiratory complications will improve Outcome: Not Progressing Goal: Cardiovascular complication will be avoided Outcome: Not Progressing   Problem: Activity: Goal: Risk for activity intolerance will decrease Outcome: Not Progressing   Problem: Nutrition: Goal: Adequate nutrition will be maintained Outcome: Not Progressing   Problem: Coping: Goal: Level of anxiety will decrease Outcome: Not Progressing   Problem: Elimination: Goal: Will not experience complications related to bowel motility Outcome: Not Progressing Goal: Will not experience complications related to urinary retention Outcome: Not Progressing   Problem: Pain Managment: Goal: General experience of comfort will improve and/or be controlled Outcome: Not Progressing   Problem: Safety: Goal: Ability to remain free from injury will improve Outcome: Not Progressing   Problem: Skin Integrity: Goal: Risk for impaired skin integrity will decrease Outcome: Not Progressing   Problem: Clinical Measurements: Goal: Ability to avoid or minimize complications of infection will improve Outcome: Not Progressing   Problem: Skin Integrity: Goal: Skin integrity will improve Outcome: Not Progressing

## 2024-06-04 NOTE — Progress Notes (Signed)
   06/04/24 2052  BiPAP/CPAP/SIPAP  $ Non-Invasive Home Ventilator  Subsequent  BiPAP/CPAP/SIPAP Pt Type Adult (prefers self placement)  BiPAP/CPAP/SIPAP Resmed  Mask Type Nasal mask  Dentures removed? Not applicable  Mask Size Medium  EPAP 10 cmH2O  FiO2 (%) 21 %  Patient Home Machine No  Patient Home Mask No  Patient Home Tubing No  Auto Titrate No  CPAP/SIPAP surface wiped down Yes  Device Plugged into RED Power Outlet Yes  BiPAP/CPAP /SiPAP Vitals  Pulse Rate 77  Resp 18  SpO2 98 %  Bilateral Breath Sounds Clear;Diminished  MEWS Score/Color  MEWS Score 0  MEWS Score Color Landy

## 2024-06-04 NOTE — Progress Notes (Signed)
 PROGRESS NOTE  [name]  DOB: 30-Jun-1941  PCP: Aisha Harvey, MD FMW:[id number]  DOA: 06/01/2024  LOS: 3 days  Hospital Day: 4  Brief narrative: [name] is a 83 y.o. male with PMH significant for obesity, OSA on CPAP, prediabetes, HTN, HLD, CKD, chronic back pain, chronic venous insufficiency, GERD, BPH, anxiety, depression Patient lives at home alone, able to ambulate independently, sometimes uses a walker.   7/21, patient was seen in the ED for few episodes of intermittent explosive diarrhea for 7 days.  Also reported rash on his right thigh.  He was noted to have right thigh shingles, started on Valtrex .  CT abdomen showed colonic diverticulosis without diverticulitis.  FOBT was positive but hemoglobin was normal.  Patient was discharged home on Valtrex . 7/23, patient returned back to the ED again with same symptoms.  He stated that the sores in his thigh have started to open up and the redness has spread.  He had apparently not been able to pick up the prescription for Valtrex .  Also reported diarrhea persistent.  In the ED, patient was afebrile, medically stable, breathing on room air. Labs showed WC count of 4, hemoglobin 13.2 stable, potassium low at 2.9, creatinine 1.98 EKG showed 89 bpm, QTc 482 ms. He was started on IV acyclovir , IV Ancef  Admitted to [facility]   Subjective: Patient was seen and examined this morning. Complains of worsening back pain, abdominal pain.  Not able to have bowel movement. Right leg cellulitis seems to be worsening.  Assessment and plan: Acute right lower extremity cellulitis Shingles Patient presented with herpetic lesions on the right upper thigh distributed in a dermatomal pattern.  Also has surrounding area of cellulitis that extends down to the knee.   He has chronic stasis changes on both lower extremities.  I noticed today that the redness and right leg is worsening as well. Currently on IV Ancef  for cellulitis.  I have used  antibiotic to IV cefepime  and IV vancomycin  today. For herpetic lesions, patient is already on Valtrex . Continue to monitor Recent Labs  Lab 05/30/24 2124 06/01/24 1535 06/01/24 2009 06/02/24 1637 06/03/24 0334 06/04/24 0340  WBC 5.7 4.0  --  4.6 4.0 4.8  LATICACIDVEN  --   --  1.4  --   --   --    Diarrhea/constipation On presentation, patient reported intermittent explosive diarrhea for last 1 week.  Had 1 bowel movement in the ED.  Not since then despite multiple attempts.  Feels constipated today. Obtain abdominal x-ray.  Hypokalemia Secondary to diarrheal loss Replacement given. Continue to monitor. 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 For pain control I have scheduled Robaxin .  Continue gabapentin  nightly, Norco PRN, Tylenol  as needed   Chronic venous insufficiency R>L Duplex is negative for DVT PTA meds- Lasix  40 mg daily Currently on hold.  Creatinine improving.  Blood pressure running elevated.  Resume Lasix .  May help right lower extremity swelling improve as well  HLD Crestor   GERD Continue PPI, Mylanta   BPH Continue tamsulosin   Morbid Obesity  Body mass index is 36.17 kg/m. Patient has been advised to make  an attempt to improve diet and exercise patterns to aid in weight loss.  OSA CPAP  Anxiety/depression Bupropion , Remeron    Right shoulder dislocation Generalized debility PT/OT eval obtained.  Home with PT recommended   Mobility: PT eval  Goals of care   Code Status: Full Code     DVT prophylaxis:  enoxaparin  (LOVENOX ) injection 40 mg Start: 06/02/24 1000 SCDs Start: 06/01/24 2247   Antimicrobials: Acyclovir , cefepime ,  vancomycin  Fluid: Not on IV fluid Consultants: None Family Communication: None at bedside  Status: Inpatient Level of care:  Telemetry .    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  He states he does not have bathroom there. Needs to continue in-hospital care: Cellulitis not improving yet Anticipated d/c to: Home in 1 to 2 days hopefully    Diet:  Diet Order             Diet Heart Room service appropriate? Yes; Fluid consistency: Thin  Diet effective now                   Scheduled Meds:  buPROPion   450 mg Oral q morning   enoxaparin  (LOVENOX ) injection  40 mg Subcutaneous Q24H   fluticasone   2 spray Each Nare QPM   furosemide   40 mg Oral Daily   gabapentin   600 mg Oral QHS   lactobacillus  1 g Oral TID WC   methocarbamol   500 mg Oral TID   mirtazapine   15 mg Oral QHS   montelukast   10 mg Oral QHS   pantoprazole   40 mg Oral BID AC   potassium chloride  SA  40 mEq Oral BID   rosuvastatin   5 mg Oral QHS   tamsulosin   0.4 mg Oral Daily   valACYclovir   1,000 mg Oral BID    PRN meds: acetaminophen  **OR** acetaminophen , alum & mag hydroxide-simeth, hydrALAZINE , HYDROcodone -acetaminophen , meclizine    Infusions:   ceFEPime  (MAXIPIME ) IV     [START ON 06/06/2024] vancomycin       Antimicrobials: Anti-infectives (From admission, onward)    Start     Dose/Rate Route Frequency Ordered Stop   06/06/24 1100  vancomycin  (VANCOREADY) IVPB 1750 mg/350 mL        1,750 mg 175 mL/hr over 120 Minutes Intravenous Every 48 hours 06/04/24 1002 06/12/24 1059   06/04/24 2300  ceFEPIme  (MAXIPIME ) 2 g in sodium chloride  0.9 % 100 mL IVPB        2 g 200 mL/hr over 30 Minutes Intravenous Every 12 hours 06/04/24 1002 06/11/24 1059   06/04/24 1045  vancomycin  (VANCOREADY) IVPB 2000 mg/400 mL        2,000 mg 200 mL/hr over 120 Minutes Intravenous  Once 06/04/24 0949 06/04/24 1321   06/04/24 1045  ceFEPIme  (MAXIPIME )  2 g in sodium chloride  0.9 % 100 mL IVPB        2 g 200 mL/hr over 30 Minutes Intravenous  Once 06/04/24 0949 06/04/24 1152   06/02/24 2200  valACYclovir  (VALTREX ) tablet 1,000 mg        1,000 mg Oral 2 times daily 06/02/24 1218 06/09/24 2159   06/02/24 0100  ceFAZolin  (ANCEF ) IVPB 2g/100 mL premix  Status:  Discontinued        2 g 200 mL/hr over 30 Minutes Intravenous Every 8 hours 06/01/24 2337 06/04/24 0949   06/01/24 2100  acyclovir  (ZOVIRAX ) 700 mg in dextrose  5 % 100 mL IVPB  Status:  Discontinued        700 mg 114 mL/hr over 60 Minutes Intravenous Every 8 hours 06/01/24 1943 06/01/24 1953   06/01/24 2100  acyclovir  (ZOVIRAX ) 700 mg in dextrose  5 % 100 mL IVPB  Status:  Discontinued        700 mg 114 mL/hr over 60 Minutes Intravenous Every 12 hours 06/01/24 1953 06/02/24 1218   06/01/24 2000  vancomycin  (VANCOREADY) IVPB 1500 mg/300 mL  Status:  Discontinued        1,500 mg 150 mL/hr over 120 Minutes Intravenous Every 48 hours 06/01/24 1940 06/01/24 2249       Objective: Vitals:   06/04/24 0918 06/04/24 1325  BP: (!) 151/76 (!) 150/75  Pulse:  72  Resp:  18  Temp:  98.2 F (36.8 C)  SpO2:  95%    Intake/Output Summary (Last 24 hours) at 06/04/2024 1441 Last data filed at 06/04/2024 1300 Gross per 24 hour  Intake 640 ml  Output 200 ml  Net 440 ml   Filed Weights   06/01/24 1533  Weight: 111.1 kg   Weight change:  Body mass index is 36.17 kg/m.   Physical Exam: General exam: Pleasant, elderly Caucasian male. Skin: No rashes, lesions or ulcers. HEENT: Atraumatic, normocephalic, no obvious bleeding Lungs: Clear to auscultation bilaterally,  CVS: S1, S2, no murmur,   GI/Abd: Soft, nontender, nondistended, bowel sound present,   CNS: Alert, awake, oriented x 3 Psychiatry: Mood appropriate Extremities: Herpetic lesions intact, cellulitis in the right leg looks worse today.   Data Review: I have personally reviewed the laboratory data and studies  available.  F/u labs  Unresulted Labs (From admission, onward)     Start     Ordered   06/08/24 0500  Creatinine, serum  (enoxaparin  (LOVENOX )    CrCl >/= 30 ml/min)  Weekly,   R     Comments: while on enoxaparin  therapy    06/01/24 2249   06/05/24 0500  CBC with Differential/Platelet  Tomorrow morning,   R        06/04/24 1441   06/05/24 0500  Basic metabolic panel with GFR  Tomorrow morning,   R        06/04/24 1441   06/02/24 1142  C Difficile Quick Screen w PCR reflex  (C Difficile quick screen w PCR reflex panel )  Once, for 24 hours,   TIMED       References:    CDiff Information Tool   06/02/24 1141   06/02/24 1141  Gastrointestinal Panel by PCR , Stool  (Gastrointestinal Panel by PCR, Stool                                                                                                                                                     **Does Not include CLOSTRIDIUM DIFFICILE testing. **If CDIFF  testing is needed, place order from the C Difficile Testing order set.**)  Once,   R        06/02/24 1141            Signed, Chapman Rota, MD [facility] 06/04/2024

## 2024-06-04 NOTE — Progress Notes (Signed)
 Pharmacy Antibiotic Note  LAVELL SUPPLE is a 83 y.o. male admitted on 06/01/2024 with cellulitis.  Pharmacy has been consulted for Vanco, Cefepime  dosing.  Active Problem(s): - explosive diarrhea, R thigh shingles/cellulitis   7/21: Pt to ED with explosive diarrhea and R thigh shingles. CT abdomen showed colonic diverticulosis. FOBT +.  7/23: Back to ED with same symptoms. Open sores and redness. Not able to pick up Rx for Valtrex .   ID: cellulitis right thigh with  shingles -Diarrhea x 1 week, C.diff/GI panel pending collection - Afebrile, WBC WNL  Valtrex  7/24 >> Acyclovir  7/23 >> 7/24 Cefazolin  7/24 >>7/26 Vanco 7/26>> Cefepime  7/26>>  Plan: - Valtrex  dose adjusted for CrCl 30 to 50 - Cefepime  2g IV q12 hrs x 7d - Vanco 2g IV x 1 then,  - Vancomycin  1750 mg IV Q 48 hrs. Expected AUC: 186, SCr used: 1.66 X 7d   Height: 5' 9 (175.3 cm) Weight: 111.1 kg (244 lb 14.9 oz) IBW/kg (Calculated) : 70.7  Temp (24hrs), Avg:98.3 F (36.8 C), Min:98 F (36.7 C), Max:98.8 F (37.1 C)  Recent Labs  Lab 05/30/24 2124 06/01/24 1535 06/01/24 2009 06/02/24 1637 06/03/24 0334 06/04/24 0340  WBC 5.7 4.0  --  4.6 4.0 4.8  CREATININE 2.20* 1.98*  --  1.89* 1.79* 1.66*  LATICACIDVEN  --   --  1.4  --   --   --     Estimated Creatinine Clearance: 41.4 mL/min (A) (by C-G formula based on SCr of 1.66 mg/dL (H)).    No Known Allergies  Aser Nylund Karoline Marina, PharmD, BCPS Clinical Staff Pharmacist  [facility] 06/04/2024 10:03 AM

## 2024-06-05 DIAGNOSIS — L03115 Cellulitis of right lower limb: Secondary | ICD-10-CM | POA: Diagnosis not present

## 2024-06-05 LAB — CBC WITH DIFFERENTIAL/PLATELET
Abs Immature Granulocytes: 0.02 K/uL (ref 0.00–0.07)
Basophils Absolute: 0 K/uL (ref 0.0–0.1)
Basophils Relative: 0 %
Eosinophils Absolute: 0.3 K/uL (ref 0.0–0.5)
Eosinophils Relative: 6 %
HCT: 37.5 % — ABNORMAL LOW (ref 39.0–52.0)
Hemoglobin: 13.1 g/dL (ref 13.0–17.0)
Immature Granulocytes: 0 %
Lymphocytes Relative: 27 %
Lymphs Abs: 1.3 K/uL (ref 0.7–4.0)
MCH: 33.9 pg (ref 26.0–34.0)
MCHC: 34.9 g/dL (ref 30.0–36.0)
MCV: 96.9 fL (ref 80.0–100.0)
Monocytes Absolute: 0.5 K/uL (ref 0.1–1.0)
Monocytes Relative: 10 %
Neutro Abs: 2.8 K/uL (ref 1.7–7.7)
Neutrophils Relative %: 57 %
Platelets: 139 K/uL — ABNORMAL LOW (ref 150–400)
RBC: 3.87 MIL/uL — ABNORMAL LOW (ref 4.22–5.81)
RDW: 12.8 % (ref 11.5–15.5)
WBC: 5 K/uL (ref 4.0–10.5)
nRBC: 0 % (ref 0.0–0.2)

## 2024-06-05 LAB — BASIC METABOLIC PANEL WITH GFR
Anion gap: 12 (ref 5–15)
BUN: 16 mg/dL (ref 8–23)
CO2: 25 mmol/L (ref 22–32)
Calcium: 9.1 mg/dL (ref 8.9–10.3)
Chloride: 99 mmol/L (ref 98–111)
Creatinine, Ser: 1.5 mg/dL — ABNORMAL HIGH (ref 0.61–1.24)
GFR, Estimated: 46 mL/min — ABNORMAL LOW (ref >60)
Glucose, Bld: 115 mg/dL — ABNORMAL HIGH (ref 70–99)
Potassium: 3.6 mmol/L (ref 3.5–5.1)
Sodium: 136 mmol/L (ref 135–145)

## 2024-06-05 MED ORDER — SENNOSIDES-DOCUSATE SODIUM 8.6-50 MG PO TABS
1.0000 | ORAL_TABLET | Freq: Two times a day (BID) | ORAL | Status: DC
  Administered 2024-06-05 – 2024-06-07 (×5): 1 via ORAL
  Filled 2024-06-05 (×5): qty 1

## 2024-06-05 MED ORDER — POLYETHYLENE GLYCOL 3350 17 G PO PACK
17.0000 g | PACK | Freq: Two times a day (BID) | ORAL | Status: DC
  Administered 2024-06-05 – 2024-06-07 (×5): 17 g via ORAL
  Filled 2024-06-05 (×5): qty 1

## 2024-06-05 MED ORDER — PHENOL 1.4 % MT LIQD
1.0000 | OROMUCOSAL | Status: DC | PRN
  Filled 2024-06-05: qty 177

## 2024-06-05 NOTE — Plan of Care (Signed)
 ?  Problem: Clinical Measurements: ?Goal: Ability to maintain clinical measurements within normal limits will improve ?Outcome: Progressing ?Goal: Will remain free from infection ?Outcome: Progressing ?Goal: Diagnostic test results will improve ?Outcome: Progressing ?  ?

## 2024-06-05 NOTE — Progress Notes (Signed)
 PROGRESS NOTE    [name]  FMW:[id number] DOB: 02-09-1941 DOA: 06/01/2024 PCP: Aisha Harvey, MD   Brief Narrative:  [name] is a 83 y.o. male with PMH significant for obesity, OSA on CPAP, prediabetes, HTN, HLD, CKD 3b, chronic back pain, chronic venous insufficiency, GERD, BPH, anxiety, depression Patient lives at home alone, able to ambulate independently, sometimes uses a walker -able to perform all of his ADLs independently.    7/21, patient was seen in the ED for few episodes of intermittent explosive diarrhea for 7 days.  Also reported painful rash on his right thigh.  He was noted to have right thigh shingles, started on Valtrex .  CT abdomen showed colonic diverticulosis without diverticulitis.  FOBT was positive but hemoglobin was normal.  Patient was discharged home on Valtrex .  7/23, patient returned back to the ED again with same symptoms.  He stated that the sores in his thigh have started to open up and the redness has spread. He had not picked up the prescription for Valtrex .  Also reported diarrhea persistent. Hospitalist called for admission.  Assessment & Plan:   Principal Problem:   Cellulitis  Acute right lower extremity cellulitis, POA Acute shingles outbreak, [place] - Patient presented with herpetic lesions on the right upper thigh distributed in a dermatomal pattern - medial left lower leg - Currently vancomycin /cefalosporin - likely transition to PO in the next 24-48h pending clinical course -Continue valacyclovir    Diarrhea, POA Now complaining of acute on chronic constipation -Patient is a poor historian in regards to timeline but reported 1 week of diarrhea initially, now appears to have had 1 bowel movement every 7 days which was quite loose. - Patient has not had bowel movement since admission -additional MiraLAX  and Senokot ordered today  - Patient appears to have chronic constipation having bowel movements every 3 to 4 days at baseline,  sometimes up to a week - Abdominal x-ray unremarkable for any acute findings   Hypokalemia In the setting of GI losses, resolved, stable   Acute on chronic back pain -Chronic degenerative disc disease L4-S1 - Without neurological deficits - Early ambulation, heat/ice as appropriate - Continue home medications including acetaminophen , hydrocodone    Chronic venous insufficiency R>L Duplex is negative for DVT PTA meds- Lasix  40 mg daily Continue Lasix    HLD - Crestor  GERD - Continue PPI, Mylanta BPH - Continue tamsulosin  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 OSA - Continue CPAP Anxiety/depression - Bupropion , Remeron  Prior right shoulder dislocation - POA, reduced 02/04/24 - chronic pain Generalized debility - PT/OT eval obtained.  Home with PT recommended   DVT prophylaxis: enoxaparin  (LOVENOX ) injection 40 mg Start: 06/02/24 1000 SCDs Start: 06/01/24 2247   Code Status:   Code Status: Full Code  Family Communication: None present  Status is: Inpatient  Dispo: The patient is from: Home              Anticipated d/c is to: Home              Anticipated d/c date is: 24 to 48 hours              Patient currently not medically stable for discharge  Consultants:  None  Procedures:  None  Antimicrobials:  Valacyclovir , cefepime , vancomycin   Subjective: No acute issues or events overnight, patient denies nausea vomiting diarrhea headache fevers chills or chest pain.  He reports ongoing constipation, unable to have bowel movement again overnight.  Objective: Vitals:   06/04/24 1325 06/04/24 1828 06/04/24 2052 06/04/24 2257  BP: (!) 150/75 (!) 156/85  (!) 176/90  Pulse: 72 76 77 76  Resp: 18 16 18 18   Temp: 98.2 F (36.8 C) 98 F (36.7 C)  98.1 F (36.7 C)  TempSrc: Oral Oral  Oral  SpO2: 95% 97% 98% 94%  Weight:      Height:        Intake/Output Summary (Last 24 hours) at 06/05/2024 0759 Last data filed at 06/04/2024 2258 Gross  per 24 hour  Intake 1259.87 ml  Output 200 ml  Net 1059.87 ml   Filed Weights   06/01/24 1533  Weight: 111.1 kg    Examination:  General:  Pleasantly resting in bed, No acute distress. HEENT:  Normocephalic atraumatic.  Sclerae nonicteric, noninjected.  Extraocular movements intact bilaterally. Neck:  Without mass or deformity.  Trachea is midline. Lungs:  Clear to auscultate bilaterally without rhonchi, wheeze, or rales. Heart:  Regular rate and rhythm.  Without murmurs, rubs, or gallops. Abdomen:  Soft, nontender, nondistended.  Without guarding or rebound. Extremities: Without cyanosis, clubbing, edema, or obvious deformity. Skin: Noted right lower extremity blanching erythema with vesicular pattern following dermatome, crusted over without notable doming.  Data Reviewed: I have personally reviewed following labs and imaging studies  CBC: Recent Labs  Lab 06/01/24 1535 06/02/24 1637 06/03/24 0334 06/04/24 0340 06/05/24 0340  WBC 4.0 4.6 4.0 4.8 5.0  NEUTROABS 2.6  --  2.2 2.4 2.8  HGB 13.2 14.2 12.4* 12.1* 13.1  HCT 39.3 41.4 36.6* 34.9* 37.5*  MCV 96.1 96.3 96.6 96.7 96.9  PLT 166 168 140* 145* 139*   Basic Metabolic Panel: Recent Labs  Lab 06/01/24 1535 06/02/24 1637 06/03/24 0334 06/04/24 0340 06/05/24 0340  NA 139 138 140 138 136  K 2.9* 3.5 3.5 3.5 3.6  CL 100 97* 101 101 99  CO2 25 26 28 27 25   GLUCOSE 116* 112* 116* 105* 115*  BUN 20 17 16 18 16   CREATININE 1.98* 1.89* 1.79* 1.66* 1.50*  CALCIUM  9.2 9.2 8.9 8.8* 9.1  MG 1.8  --   --   --   --    GFR: Estimated Creatinine Clearance: 45.9 mL/min (A) (by C-G formula based on SCr of 1.5 mg/dL (H)). Liver Function Tests: Recent Labs  Lab 05/30/24 2124 06/01/24 1535  AST 50* 54*  ALT 44 51*  ALKPHOS 138* 115  BILITOT 0.7 1.2  PROT 7.3 7.5  ALBUMIN  4.3 4.1   Recent Labs  Lab 05/30/24 2124 06/01/24 1535  LIPASE 52* 61*   Sepsis Labs: Recent Labs  Lab 06/01/24 2009  LATICACIDVEN 1.4     Recent Results (from the past 240 hours)  Blood culture (routine x 2)     Status: None (Preliminary result)   Collection Time: 06/01/24  8:03 PM   Specimen: BLOOD LEFT ARM  Result Value Ref Range Status   Specimen Description   Final    BLOOD LEFT ARM Performed at [facility], [address] [place], [place] [address]    Special Requests   Final    BOTTLES DRAWN AEROBIC AND ANAEROBIC Blood Culture adequate volume Performed at [facility], [address], [place], [place] [address]    Culture   Final    NO GROWTH 3 DAYS Performed at [facility], [address], [place], [place] [address]    Report Status PENDING  Incomplete  Blood culture (routine x  2)     Status: None (Preliminary result)   Collection Time: 06/02/24  7:23 PM   Specimen: BLOOD RIGHT HAND  Result Value Ref Range Status   Specimen Description   Final    BLOOD RIGHT HAND Performed at [facility], [address], [place], [place] [address]    Special Requests   Final    BOTTLES DRAWN AEROBIC AND ANAEROBIC Blood Culture adequate volume Performed at [facility], [address] [place], [place] [address]    Culture   Final    NO GROWTH 3 DAYS Performed at [facility], [address] [place], [place] [address]    Report Status PENDING  Incomplete         Radiology Studies: DG Abd Portable 1V Result Date: 06/04/2024 CLINICAL DATA:  Constipation EXAM: PORTABLE ABDOMEN - 1 VIEW COMPARISON:  05/30/2024 CT scan FINDINGS: Body habitus reduces diagnostic sensitivity and specificity. Suspected mild subsegmental atelectasis at the lung bases. 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 Atherosclerosis is present, including aortoiliac atherosclerotic disease. Nonobstructive right nephrolithiasis observed with numerous right renal calculi. Left total hip prosthesis. Unremarkable bowel gas pattern. IMPRESSION: 1. Nonobstructive  right nephrolithiasis. 2. 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 3. Aortic Atherosclerosis (ICD10-I70.0). Electronically Signed   By: Ryan Salvage M.D.   On: 06/04/2024 13:43        Scheduled Meds:  buPROPion   450 mg Oral q morning   enoxaparin  (LOVENOX ) injection  40 mg Subcutaneous Q24H   fluticasone   2 spray Each Nare QPM   furosemide   40 mg Oral Daily   gabapentin   600 mg Oral QHS   lactobacillus  1 g Oral TID WC   methocarbamol   500 mg Oral TID   mirtazapine   15 mg Oral QHS   montelukast   10 mg Oral QHS   pantoprazole   40 mg Oral BID AC   potassium chloride  SA  40 mEq Oral BID   rosuvastatin   5 mg Oral QHS   tamsulosin   0.4 mg Oral Daily   valACYclovir   1,000 mg Oral BID   Continuous Infusions:  ceFEPime  (MAXIPIME ) IV 2 g (06/04/24 2227)   [START ON 06/06/2024] vancomycin        LOS: 4 days   Time spent:  Elsie JAYSON [place], DO [facility]  If 7PM-7AM, please contact night-coverage [email or web address]  06/05/2024, 7:59 AM

## 2024-06-05 NOTE — Plan of Care (Signed)

## 2024-06-06 ENCOUNTER — Encounter: Payer: Self-pay | Admitting: Urology

## 2024-06-06 ENCOUNTER — Other Ambulatory Visit (HOSPITAL_COMMUNITY): Payer: Self-pay

## 2024-06-06 DIAGNOSIS — B028 Zoster with other complications: Secondary | ICD-10-CM

## 2024-06-06 DIAGNOSIS — L03115 Cellulitis of right lower limb: Secondary | ICD-10-CM | POA: Diagnosis not present

## 2024-06-06 MED ORDER — DOXYCYCLINE HYCLATE 100 MG PO TABS
100.0000 mg | ORAL_TABLET | Freq: Two times a day (BID) | ORAL | Status: DC
  Administered 2024-06-07: 100 mg via ORAL
  Filled 2024-06-06: qty 1

## 2024-06-06 MED ORDER — DOXYCYCLINE HYCLATE 100 MG PO TABS
100.0000 mg | ORAL_TABLET | Freq: Two times a day (BID) | ORAL | 0 refills | Status: DC
  Filled 2024-06-06: qty 16, 8d supply, fill #0

## 2024-06-06 MED ORDER — FLORANEX PO PACK: 1.0000 g | PACK | Freq: Three times a day (TID) | ORAL | 0 refills | Status: DC

## 2024-06-06 MED ORDER — MAGNESIUM CITRATE PO SOLN
1.0000 | Freq: Once | ORAL | Status: AC
  Administered 2024-06-06: 1 via ORAL
  Filled 2024-06-06: qty 296

## 2024-06-06 MED ORDER — POLYETHYLENE GLYCOL 3350 17 G PO PACK
17.0000 g | PACK | Freq: Two times a day (BID) | ORAL | 0 refills | Status: DC
Start: 2024-06-06 — End: 2024-09-20

## 2024-06-06 MED ORDER — SENNOSIDES-DOCUSATE SODIUM 8.6-50 MG PO TABS: 1.0000 | ORAL_TABLET | Freq: Two times a day (BID) | ORAL | 0 refills | Status: DC

## 2024-06-06 NOTE — Progress Notes (Signed)
 PT Cancellation Note  Patient Details Name: [name] MRN: [id number] DOB: [birth year]   Cancelled Treatment:     Pt sitting EOB Indep with a number of reasons he can't walk with me this morning.  Pt c/o R inner thigh pain, c/o that sitting on EOB increases pressure to his back thigh but declines any/all attempts to sit in the recliner.  He was c/o about the food and the dirtiness of the room.  Pt was very clear he was not going to walk right now, Then MD arrived.    Katheryn Leap  PTA Acute  Rehabilitation Services Office M-F          [phone]

## 2024-06-06 NOTE — Discharge Summary (Signed)
 Physician Discharge Summary  [name] FMW:[id number] DOB: 1940/12/01 DOA: 06/01/2024  PCP: Aisha Harvey, MD  Admit date: 06/01/2024 Discharge date: 06/06/2024  Admitted From: Home Disposition: Home  Recommendations for Outpatient Follow-up:  Follow up with PCP in 1-2 weeks  Home Health: Patient deferred despite recommendations (set up for outpatient PT) Equipment/Devices: Bedside commode  Discharge Condition: Stable CODE STATUS: Full Diet recommendation: Low-salt low-fat low-carb diet  Brief/Interim Summary: [name] is a 83 y.o. male with PMH significant for obesity, OSA on CPAP, prediabetes, HTN, HLD, CKD 3b, chronic back pain, chronic venous insufficiency, GERD, BPH, anxiety, depression Patient lives at home alone, able to ambulate independently, sometimes uses a walker -able to perform all of his ADLs independently.     7/21, patient was seen in the ED for few episodes of intermittent explosive diarrhea for 7 days.  Also reported painful rash on his right thigh.  He was noted to have right thigh shingles, started on Valtrex .  CT abdomen showed colonic diverticulosis without diverticulitis.  FOBT was positive but hemoglobin was normal.  Patient was discharged home on Valtrex .   7/23, patient returned back to the ED again with same symptoms.  He stated that the sores in his thigh have started to open up and the redness has spread. He had not picked up the prescription for Valtrex .  Also reported diarrhea persistent. Hospitalist called for admission.  Patient admitted as above with reports of persistent diarrhea however patient had very few bowel movements here, none over the previous 4 days.  Patient's GI symptoms improved with supportive care and treatment.  He was also noted at intake to have right lower extremity cellulitis which appear to be bacterial in nature, likely stemming from concurrent shingles infection of the same area (L3 distribution).  At this time patient's  cellulitis appears to be improving, transitioning from IV to p.o. antibiotics as well as p.o. antivirals in the setting of shingles.  Recommend close follow-up with PCP in the next 1 to 2 weeks for ongoing evaluation and treatment.  Discussed need for covering his active lesions as they are highly infectious.  Patient's chronic morbid conditions otherwise appear to be quite stable.  No medication changes as below other than initiation of doxycycline  and valacyclovir .   Discharge Diagnoses:  Principal Problem:   Cellulitis  Acute right lower extremity cellulitis, POA Acute shingles outbreak, [place] - Patient presented with herpetic lesions on the right upper thigh distributed in a dermatomal pattern - medial right lower leg through inner thigh - Lower extremity cellulitis appears to be improving with decreased region of blanching erythema   Diarrhea, POA, resolved Now complaining of acute on chronic constipation -Patient is a poor historian in regards to timeline but reported 1 week of diarrhea initially, now appears to have had 1 bowel movement every 7 days which was quite loose. - Improving with MiraLAX  and Senokot as well as magnesium  citrate - Patient appears to have chronic constipation having bowel movements every 3 to 4 days at baseline, sometimes up to a week -recommend increased GI regimen at home - Abdominal x-ray unremarkable for any acute findings   Hypokalemia In the setting of GI losses, resolved, stable   Acute on chronic back pain -Chronic degenerative disc disease L4-S1 - Without neurological deficits - Early ambulation, heat/ice as appropriate - Continue home medications including acetaminophen , hydrocodone    Chronic venous insufficiency R>L Duplex is negative for DVT PTA meds- Lasix  40 mg daily Continue Lasix    HLD -  Crestor  GERD - Continue PPI, Mylanta BPH - Continue tamsulosin  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 OSA - Continue  CPAP Anxiety/depression - Bupropion , Remeron  Prior right shoulder dislocation - POA, reduced 02/04/24 - chronic pain Generalized debility - PT/OT eval obtained.  Home with PT recommended -patient deferred, will set up for outpatient PT  Discharge Instructions  Discharge Instructions     Ambulatory referral to Physical Therapy   Complete by: As directed    Call MD for:  difficulty breathing, headache or visual disturbances   Complete by: As directed    Call MD for:  extreme fatigue   Complete by: As directed    Call MD for:  hives   Complete by: As directed    Call MD for:  persistant dizziness or light-headedness   Complete by: As directed    Call MD for:  persistant nausea and vomiting   Complete by: As directed    Call MD for:  severe uncontrolled pain   Complete by: As directed    Call MD for:  temperature >100.4   Complete by: As directed    Diet - low sodium heart healthy   Complete by: As directed    Increase activity slowly   Complete by: As directed       Allergies as of 06/06/2024   No Known Allergies      Medication List     TAKE these medications    Astepro  205.5 MCG/SPRAY Soln Generic drug: Azelastine  HCl Place 2 sprays into both nostrils as needed.   buPROPion  150 MG 24 hr tablet Commonly known as: WELLBUTRIN  XL Take 450 mg by mouth every morning.   CertaVite/Antioxidants Tabs Take 1 tablet by mouth every morning.   doxycycline  100 MG tablet Commonly known as: VIBRA -TABS Take 1 tablet (100 mg total) by mouth every 12 (twelve) hours for 8 days. Start taking on: June 07, 2024   FiberCon 625 MG tablet Generic drug: polycarbophil Take 1 tablet (625 mg total) by mouth daily.   Flonase  Allergy Relief 50 MCG/ACT nasal spray Generic drug: fluticasone  Place 2 sprays into both nostrils as needed.   furosemide  40 MG tablet Commonly known as: LASIX  TAKE 1 TABLET BY MOUTH EVERY MORNING AND AN ADDITIONAL TABLET DAILY AS NEEDED FOR UNRESOLVED SWELLING  OR FLUID RETENTION What changed: See the new instructions.   gabapentin  300 MG capsule Commonly known as: NEURONTIN  TAKE 1 TO 2 CAPSULES BY MOUTH AT BEDTIME What changed: See the new instructions.   HYDROcodone -acetaminophen  5-325 MG tablet Commonly known as: NORCO/VICODIN Take 1-2 tablets by mouth every 6 (six) hours as needed.   lactobacillus Pack Take 1 packet (1 g total) by mouth 3 (three) times daily with meals.   meclizine  25 MG tablet Commonly known as: ANTIVERT  Take 1 tablet (25 mg total) by mouth 3 (three) times daily as needed for dizziness.   methocarbamol  500 MG tablet Commonly known as: ROBAXIN  Take 1 tablet (500 mg total) by mouth every 8 (eight) hours as needed for muscle spasms. What changed: when to take this   mirtazapine  15 MG tablet Commonly known as: REMERON  Take 15 mg by mouth at bedtime.   montelukast  10 MG tablet Commonly known as: SINGULAIR  Take 1 tablet (10 mg total) by mouth at bedtime.   pantoprazole  40 MG tablet Commonly known as: PROTONIX  Take 1 tablet (40 mg total) by mouth 2 (two) times daily before a meal.   polyethylene glycol 17 g packet  Commonly known as: MIRALAX  / GLYCOLAX  Take 17 g by mouth 2 (two) times daily.   potassium chloride  SA 20 MEQ tablet Commonly known as: KLOR-CON  M Take 2 tablets (40 mEq total) by mouth 2 (two) times daily.   rosuvastatin  5 MG tablet Commonly known as: CRESTOR  Take 5 mg by mouth daily.   senna-docusate 8.6-50 MG tablet Commonly known as: Senokot-S Take 1 tablet by mouth 2 (two) times daily.   tamsulosin  0.4 MG Caps capsule Commonly known as: FLOMAX  Take 0.4 mg by mouth 2 (two) times daily.   valACYclovir  1000 MG tablet Commonly known as: VALTREX  Take 1 tablet (1,000 mg total) by mouth 3 (three) times daily.               Durable Medical Equipment  (From admission, onward)           Start     Ordered   06/06/24 1319  For home use only DME Bedside commode  Once        Question:  Patient needs a bedside commode to treat with the following condition  Answer:  Ambulatory dysfunction   06/06/24 1318            Follow-up Information     [facility] Follow up.   Why: Bedside [facility] Contact information: [address] [place], [place] [address]   Phone: [phone]               No Known Allergies  Consultations: None  Procedures/Studies: DG Abd Portable 1V Result Date: 06/04/2024 CLINICAL DATA:  Constipation EXAM: PORTABLE ABDOMEN - 1 VIEW COMPARISON:  05/30/2024 CT scan FINDINGS: Body habitus reduces diagnostic sensitivity and specificity. Suspected mild subsegmental atelectasis at the lung bases. 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 Atherosclerosis is present, including aortoiliac atherosclerotic disease. Nonobstructive right nephrolithiasis observed with numerous right renal calculi. Left total hip prosthesis. Unremarkable bowel gas pattern. IMPRESSION: 1. Nonobstructive right nephrolithiasis. 2. 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 3. Aortic Atherosclerosis (ICD10-I70.0). Electronically Signed   By: Ryan Salvage M.D.   On: 06/04/2024 13:43   VAS US  LOWER EXTREMITY VENOUS (DVT) (ONLY [facility] & [facility]) Result Date: 06/02/2024  Lower Venous DVT Study Patient Name:  KENN REKOWSKI [facility]  Date of Exam:   06/02/2024 Medical Rec #: [id number]        Accession #:    [id number] Date of Birth: [birth year]        Patient Gender: M Patient Age:   56 years Exam Location:  [facility] Procedure:      VAS US  LOWER EXTREMITY VENOUS (DVT) Referring Phys: MAUDE DART --------------------------------------------------------------------------------  Indications: Pain, Swelling, and Erythema. Other Indications: Cellulits / herpes zoster infection. Risk Factors: Hx of venous insufficiency. Limitations: Poor ultrasound/tissue interface and body habitus. Comparison Study: Previous exam on 01/14/2012 was negative  for DVT Performing Technologist: Ezzie Potters RVT, RDMS  Examination Guidelines: A complete evaluation includes B-mode imaging, spectral Doppler, color Doppler, and power Doppler as needed of all accessible portions of each vessel. Bilateral testing is considered an integral part of a complete examination. Limited examinations for reoccurring indications may be performed as noted. The reflux portion of the exam is performed with the patient in reverse Trendelenburg.  +---------+---------------+---------+-----------+----------+--------------+ RIGHT    CompressibilityPhasicitySpontaneityPropertiesThrombus Aging +---------+---------------+---------+-----------+----------+--------------+ CFV      Full           Yes      Yes                                 +---------+---------------+---------+-----------+----------+--------------+  SFJ      Full                                                        +---------+---------------+---------+-----------+----------+--------------+ FV Prox  Full           Yes      Yes                                 +---------+---------------+---------+-----------+----------+--------------+ FV Mid   Full           Yes      Yes                                 +---------+---------------+---------+-----------+----------+--------------+ FV DistalFull           Yes      Yes                                 +---------+---------------+---------+-----------+----------+--------------+ PFV      Full                                                        +---------+---------------+---------+-----------+----------+--------------+ POP      Full           Yes      Yes                                 +---------+---------------+---------+-----------+----------+--------------+   +----+---------------+---------+-----------+----------+--------------+ LEFTCompressibilityPhasicitySpontaneityPropertiesThrombus Aging  +----+---------------+---------+-----------+----------+--------------+ CFV Full           Yes      Yes                                 +----+---------------+---------+-----------+----------+--------------+     Summary: RIGHT: - There is no evidence of deep vein thrombosis in the lower extremity.  - No cystic structure found in the popliteal fossa. - Ultrasound characteristics of enlarged lymph nodes are noted in the groin. Subcutaneous edema in area of calf and ankle  LEFT: - No evidence of common femoral vein obstruction.   *See table(s) above for measurements and observations. Electronically signed by Lonni Gaskins MD on 06/02/2024 at 4:48:31 PM.    Final    DG Chest Portable 1 View Result Date: 06/01/2024 CLINICAL DATA:  Recently diagnosed with shingles, presenting with right-sided chest pain that radiates to the back and abdomen x2 days. EXAM: PORTABLE CHEST 1 VIEW COMPARISON:  February 03, 2024 FINDINGS: The heart size and mediastinal contours are within normal limits. Low lung volumes are noted. No acute infiltrate, pleural effusion or pneumothorax is identified. Multilevel degenerative changes are seen throughout the thoracic spine. IMPRESSION: No active disease. Electronically Signed   By: Suzen Dials M.D.   On: 06/01/2024 20:24   CT ABDOMEN PELVIS WO CONTRAST Result Date: 05/30/2024 CLINICAL DATA:  Abdominal pain, acute, nonlocalized EXAM: CT ABDOMEN  AND PELVIS WITHOUT CONTRAST TECHNIQUE: Multidetector CT imaging of the abdomen and pelvis was performed following the standard protocol without IV contrast. RADIATION DOSE REDUCTION: This exam was performed according to the departmental dose-optimization program which includes automated exposure control, adjustment of the mA and/or kV according to patient size and/or use of iterative reconstruction technique. COMPARISON:  CT abdomen pelvis 03/18/2023 FINDINGS: Lower chest: No acute abnormality. Tiny hiatal hernia. Coronary artery  calcification. Hepatobiliary: No focal liver abnormality. No gallstones, gallbladder wall thickening, or pericholecystic fluid. No biliary dilatation. Pancreas: No focal lesion. Normal pancreatic contour. No surrounding inflammatory changes. No main pancreatic ductal dilatation. Spleen: Normal in size without focal abnormality. Adrenals/Urinary Tract: No adrenal nodule bilaterally. Right nephrolithiasis measuring up to 8 mm. No left nephrolithiasis. No hydronephrosis. No definite contour-deforming renal mass. No ureterolithiasis or hydroureter. The urinary bladder is unremarkable. Stomach/Bowel: Stomach is within normal limits. No evidence of bowel wall thickening or dilatation. Colonic diverticulosis. Appendix appears normal. Vascular/Lymphatic: No abdominal aorta or iliac aneurysm. Severe atherosclerotic plaque of the aorta and its branches. No abdominal, pelvic, or inguinal lymphadenopathy. Reproductive: Prostate is unremarkable. Other: No intraperitoneal free fluid. No intraperitoneal free gas. No organized fluid collection. Musculoskeletal: No abdominal wall hernia or abnormality. No suspicious lytic or blastic osseous lesions. No acute displaced fracture. Levocurvature of the thoracolumbar spine. Multilevel degenerative changes of the spine. Multilevel intervertebral disc space vacuum phenomenon. Mild retrolisthesis of L1 on L2, L2 on L3 grade 1 anterolisthesis of L4 on L5. Total left hip arthroplasty. At least mild degenerative changes of the right hip. IMPRESSION: 1. Tiny hiatal hernia. 2. Colonic diverticulosis with no acute diverticulitis. 3. Nonobstructive right nephrolithiasis. 4.  Aortic Atherosclerosis (ICD10-I70.0). Electronically Signed   By: Morgane  Naveau M.D.   On: 05/30/2024 22:48     Subjective: No acute issues or events overnight   Discharge Exam: Vitals:   06/06/24 0543 06/06/24 1414  BP: (!) 153/72 125/65  Pulse: 72 78  Resp: 16 20  Temp: 98.7 F (37.1 C) 97.7 F (36.5 C)   SpO2: 96% 98%   Vitals:   06/05/24 1458 06/05/24 2138 06/06/24 0543 06/06/24 1414  BP: 130/77 (!) 152/79 (!) 153/72 125/65  Pulse: (!) 105 70 72 78  Resp: 18 15 16 20   Temp: 97.8 F (36.6 C) 98 F (36.7 C) 98.7 F (37.1 C) 97.7 F (36.5 C)  TempSrc: Oral Oral  Oral  SpO2: 95% 98% 96% 98%  Weight:      Height:        General:  Pleasantly resting in bed, No acute distress. HEENT:  Normocephalic atraumatic.  Sclerae nonicteric, noninjected.  Extraocular movements intact bilaterally. Neck:  Without mass or deformity.  Trachea is midline. Lungs:  Clear to auscultate bilaterally without rhonchi, wheeze, or rales. Heart:  Regular rate and rhythm.  Without murmurs, rubs, or gallops. Abdomen:  Soft, nontender, nondistended.  Without guarding or rebound. Extremities: Without cyanosis, clubbing, edema, or obvious deformity. Skin: Noted right lower extremity blanching erythema with concurrent vesicular rash whose pattern following dermatome to the inner thigh, crusted over without notable doming  The results of significant diagnostics from this hospitalization (including imaging, microbiology, ancillary and laboratory) are listed below for reference.     Microbiology: Recent Results (from the past 240 hours)  Blood culture (routine x 2)     Status: None (Preliminary result)   Collection Time: 06/01/24  8:03 PM   Specimen: BLOOD LEFT ARM  Result Value Ref Range Status   Specimen Description  Final    BLOOD LEFT ARM Performed at [facility], [address] [place], [place] [address]    Special Requests   Final    BOTTLES DRAWN AEROBIC AND ANAEROBIC Blood Culture adequate volume Performed at [facility], [address] [place], [place] [address]    Culture   Final    NO GROWTH 4 DAYS Performed at [facility], [address] [place], [place] [address]    Report Status PENDING  Incomplete  Blood culture (routine x 2)     Status: None  (Preliminary result)   Collection Time: 06/02/24  7:23 PM   Specimen: BLOOD RIGHT HAND  Result Value Ref Range Status   Specimen Description   Final    BLOOD RIGHT HAND Performed at [facility], [address] [place], [place] [address]    Special Requests   Final    BOTTLES DRAWN AEROBIC AND ANAEROBIC Blood Culture adequate volume Performed at [facility], [address], [place], [place] [address]    Culture   Final    NO GROWTH 4 DAYS Performed at [facility], [address], [place], [place] [address]    Report Status PENDING  Incomplete     Labs:  Basic Metabolic Panel: Recent Labs  Lab 06/01/24 1535 06/02/24 1637 06/03/24 0334 06/04/24 0340 06/05/24 0340  NA 139 138 140 138 136  K 2.9* 3.5 3.5 3.5 3.6  CL 100 97* 101 101 99  CO2 25 26 28 27 25   GLUCOSE 116* 112* 116* 105* 115*  BUN 20 17 16 18 16   CREATININE 1.98* 1.89* 1.79* 1.66* 1.50*  CALCIUM  9.2 9.2 8.9 8.8* 9.1  MG 1.8  --   --   --   --    Liver Function Tests: Recent Labs  Lab 05/30/24 2124 06/01/24 1535  AST 50* 54*  ALT 44 51*  ALKPHOS 138* 115  BILITOT 0.7 1.2  PROT 7.3 7.5  ALBUMIN  4.3 4.1   Recent Labs  Lab 05/30/24 2124 06/01/24 1535  LIPASE 52* 61*   CBC: Recent Labs  Lab 06/01/24 1535 06/02/24 1637 06/03/24 0334 06/04/24 0340 06/05/24 0340  WBC 4.0 4.6 4.0 4.8 5.0  NEUTROABS 2.6  --  2.2 2.4 2.8  HGB 13.2 14.2 12.4* 12.1* 13.1  HCT 39.3 41.4 36.6* 34.9* 37.5*  MCV 96.1 96.3 96.6 96.7 96.9  PLT 166 168 140* 145* 139*   Urinalysis    Component Value Date/Time   COLORURINE STRAW (A) 06/01/2024 1646   APPEARANCEUR CLEAR 06/01/2024 1646   LABSPEC 1.006 06/01/2024 1646   PHURINE 6.0 06/01/2024 1646   GLUCOSEU NEGATIVE 06/01/2024 1646   HGBUR NEGATIVE 06/01/2024 1646   BILIRUBINUR NEGATIVE 06/01/2024 1646   BILIRUBINUR negative 05/19/2016 1228   BILIRUBINUR neg 04/16/2015 1812   KETONESUR NEGATIVE 06/01/2024 1646   PROTEINUR NEGATIVE  06/01/2024 1646   UROBILINOGEN 0.2 05/19/2016 1228   UROBILINOGEN 0.2 05/22/2015 2010   NITRITE NEGATIVE 06/01/2024 1646   LEUKOCYTESUR NEGATIVE 06/01/2024 1646   Sepsis Labs Recent Labs  Lab 06/02/24 1637 06/03/24 0334 06/04/24 0340 06/05/24 0340  WBC 4.6 4.0 4.8 5.0   Microbiology Recent Results (from the past 240 hours)  Blood culture (routine x 2)     Status: None (Preliminary result)   Collection Time: 06/01/24  8:03 PM   Specimen: BLOOD LEFT ARM  Result Value Ref Range Status   Specimen Description   Final  BLOOD LEFT ARM Performed at [facility], [address] [place], [place] [address]    Special Requests   Final    BOTTLES DRAWN AEROBIC AND ANAEROBIC Blood Culture adequate volume Performed at [facility], [address] [place], [place] [address]    Culture   Final    NO GROWTH 4 DAYS Performed at [facility], [name] [place], [place] [address]    Report Status PENDING  Incomplete  Blood culture (routine x 2)     Status: None (Preliminary result)   Collection Time: 06/02/24  7:23 PM   Specimen: BLOOD RIGHT HAND  Result Value Ref Range Status   Specimen Description   Final    BLOOD RIGHT HAND Performed at [facility], [address], [place], [place] [address]    Special Requests   Final    BOTTLES DRAWN AEROBIC AND ANAEROBIC Blood Culture adequate volume Performed at [facility], [address], [place], [place] [address]    Culture   Final    NO GROWTH 4 DAYS Performed at [facility], [address] [place], [place] [address]    Report Status PENDING  Incomplete     Time coordinating discharge: Over 30 minutes  SIGNED:   Elsie JAYSON [place], DO [facility] 06/06/2024, 3:32 PM Pager   If 7PM-7AM, please contact night-coverage [email or web address]

## 2024-06-06 NOTE — Progress Notes (Signed)
 Pt states that the person who has his house key wont be home til very late tonight and not able to go to his house to turn on his [facility] until tomorrow. Also there is not a toilet at his place, need a bedside commode. He wants to go home tomorrow morning. MD notified.

## 2024-06-06 NOTE — Plan of Care (Signed)
  Problem: Pain Managment: Goal: General experience of comfort will improve and/or be controlled Outcome: Progressing   Problem: Safety: Goal: Ability to remain free from injury will improve Outcome: Progressing   Problem: Skin Integrity: Goal: Risk for impaired skin integrity will decrease Outcome: Progressing

## 2024-06-06 NOTE — Progress Notes (Signed)
 Occupational Therapy Treatment Patient Details Name: [name] MRN: [id number] DOB: Feb 18, 1941 Today's Date: 06/06/2024   History of present illness ARNEZ STONEKING is an 83 yo male with explosive diarrhea. 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   OT comments  Patient seen for skilled OT session this afternoon. Issued and trained in [facility] sponge for LB bathing, strategies for toileting safety (BSC to be provided and urinal use also rec) and provided with training and education for R shoulder pendulums and lap slides for HEP carryover for d/c home. Recommending OPOT if able to access as patient declining HHOT services. Will continue to follow as needed to allow for safe d/c home from hospital.       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 Restrictions Weight Bearing Restrictions Per Provider Order: No       Mobility Bed Mobility Overal bed mobility:  (was on EOB anr erequested to remain)                  Transfers Overall transfer level: Needs assistance Equipment used: Rolling walker (2 wheels) Transfers: Sit to/from Stand, Bed to chair/wheelchair/BSC Sit to Stand: Supervision     Step pivot transfers: Contact guard assist     General transfer comment: cues for hand placement     Balance Overall balance assessment: Needs assistance Sitting-balance support: Feet supported, No upper extremity  supported Sitting balance-Leahy Scale: Good     Standing balance support: No upper extremity supported Standing balance-Leahy Scale: Fair                             ADL either performed or assessed with clinical judgement   ADL Overall ADL's : Needs assistance/impaired             Lower Body Bathing: Modified independent (post LH sponge education and training) Lower Body Bathing Details (indicate cue type and reason): issued and trained in use of LH sponge for LB bathing           Toilet Transfer Details (indicate cue type and reason): recommending BSC as 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, educated in splash ring, bucket and use of liners to dispose of waste, educated to use urinals as well                Extremity/Trunk Assessment Upper Extremity Assessment Upper Extremity Assessment: Right hand dominant RUE Deficits / Details: recent hx of fall with r shoulder dislocation and reset at [facility], remains with limited A/PROM 0-75 degrees; educated and trained in R UE pendulums and lap slides with handouts provided for home carryover 10 reps TID LUE Deficits / Details: lawnmower injury with L 2nd and 3rd digit PIP amputation at 83 yo LUE Coordination: decreased fine motor   Lower Extremity Assessment Lower Extremity Assessment: Defer to PT evaluation        Vision   Vision Assessment?: No apparent visual deficits  Communication Communication Communication: No apparent difficulties   Cognition Arousal: Alert Behavior During Therapy: WFL for tasks assessed/performed, Impulsive Cognition: No apparent impairments             OT - Cognition Comments: decreased insight into strategies that could assist with deficits, reports driving without any adaptations with R foot drop                 Following commands: Intact        Cueing   Cueing Techniques: Verbal cues  Exercises      Shoulder Instructions  R  shoulder pendulums and lap slides rec TID 10 reps each      General Comments continues to present with edema and R anterior shin skin breakdown    Pertinent Vitals/ Pain       Pain Assessment Pain Assessment: 0-10 Pain Score: 2  Pain Location: R LE Pain Descriptors / Indicators: Dull Pain Intervention(s): Limited activity within patient's tolerance, Repositioned   Frequency  Min 2X/week        Progress Toward Goals  OT Goals(current goals can now be found in the care plan section)  Progress towards OT goals: Progressing toward goals  Acute Rehab OT Goals Patient Stated Goal: to get a commode delivered OT Goal Formulation: With patient Time For Goal Achievement: 06/17/24 Potential to Achieve Goals: Fair  Plan      Co-evaluation                 AM-PAC OT 6 Clicks Daily Activity     Outcome Measure   Help from another person eating meals?: None Help from another person taking care of personal grooming?: None Help from another person toileting, which includes using toliet, bedpan, or urinal?: A Little Help from another person bathing (including washing, rinsing, drying)?: None Help from another person to put on and taking off regular upper body clothing?: None Help from another person to put on and taking off regular lower body clothing?: A Little 6 Click Score: 22    End of Session Equipment Utilized During Treatment: Gait belt;Rolling walker (2 wheels)  OT Visit Diagnosis: Unsteadiness on feet (R26.81);Other abnormalities of gait and mobility (R26.89);History of falling (Z91.81)   Activity Tolerance Patient limited by fatigue   Patient Left in bed;with call bell/phone within reach (insisted on EOB)   Nurse Communication Mobility status        Time: 1415-1445 OT Time Calculation (min): 30 min  Charges: OT General Charges $OT Visit: 1 Visit OT Treatments $Self Care/Home Management : 8-22 mins $Therapeutic Exercise: 8-22 mins  Braylon Grenda  OT/L Acute Rehabilitation Department  [phone]  06/06/2024, 3:56 PM

## 2024-06-06 NOTE — TOC Transition Note (Signed)
 Transition of Care [facility]) - Discharge Note   Patient Details  Name: [name] MRN: [id number] Date of Birth: 23-Nov-1940  Transition of Care [facility]) CM/SW Contact:  Alfonse JONELLE Rex, RN Phone Number: 06/06/2024, 1:22 PM   Clinical Narrative:   DC to Home. PT recommendation for [facility] PT/OT, pt declined, states his residence can not accommodate [facility] services. OPPT referral sent to [facility] [address] BSC ordered through [employer], rep-Jermaine, to be delivered to bedside, added to AVS. No further TOC needs identified at this time.     Final next level of care: OP Rehab Barriers to Discharge: Barriers Resolved   Patient Goals and CMS Choice Patient states their goals for this hospitalization and ongoing recovery are:: return home          Discharge Placement                       Discharge Plan and Services Additional resources added to the After Visit Summary for                  DME Arranged: Bedside commode DME Agency: [employer] Date DME Agency Contacted: 06/06/24 Time DME Agency Contacted: [phone] Representative spoke with at DME Agency: [place]            Social Drivers of Health (SDOH) Interventions SDOH Screenings   Food Insecurity: No Food Insecurity (06/02/2024)  Housing: Low Risk  (06/02/2024)  Transportation Needs: No Transportation Needs (06/02/2024)  Utilities: Not At Risk (06/02/2024)  Depression (PHQ2-9): Medium Risk (08/09/2020)  Social Connections: Moderately Integrated (06/02/2024)  Tobacco Use: Low Risk  (06/01/2024)     Readmission Risk Interventions    06/03/2024   10:51 AM 04/10/2023   12:01 PM 11/20/2022   12:16 PM  Readmission Risk Prevention Plan  Transportation Screening Complete Complete   PCP or Specialist Appt within 5-7 Days Complete    Home Care Screening Complete    Medication Review (RN CM) Complete    Medication Review (RN Care Manager)  Complete   PCP or Specialist appointment within 3-5 days of  discharge  Complete Complete  HRI or Home Care Consult  Complete Complete  SW Recovery Care/Counseling Consult  Complete Complete  Palliative Care Screening  Not Applicable Not Applicable  Skilled Nursing Facility  Not Applicable Not Applicable

## 2024-06-06 NOTE — Progress Notes (Signed)
   06/06/24 2225  BiPAP/CPAP/SIPAP  BiPAP/CPAP/SIPAP Pt Type Adult  BiPAP/CPAP/SIPAP Resmed  Reason BIPAP/CPAP not in use  (Pt states he places himself on machine when ready for bed.)  Mask Type Nasal mask  Dentures removed? Not applicable  EPAP 10 cmH2O  FiO2 (%) 21 %  Device Plugged into [employer] Power Outlet Yes

## 2024-06-06 NOTE — Progress Notes (Addendum)
 In to discuss discharge with patient.  We discussed that he was medically discharged per the physician.  Patient stated he had no way to get into his residence as the friend who had is key was out of town.  This friend was coming to get him when they experienced car trouble. Per patient there is no other way for him to enter his residence.  Pt stated he would reach out again to see if his ride will be here later with his keys.  He will inform staff of any updated information. Dr Lue made aware of this interaction.  No further instructions at this time.

## 2024-06-06 NOTE — Progress Notes (Signed)
   06/05/24 2235  BiPAP/CPAP/SIPAP  Reason BIPAP/CPAP not in use  (pt is not on ast this time but pt states he is able to place the machine on his own.)

## 2024-06-06 NOTE — Plan of Care (Signed)
  Problem: Education: Goal: Knowledge of General Education information will improve Description: Including pain rating scale, medication(s)/side effects and non-pharmacologic comfort measures Outcome: Progressing   Problem: Health Behavior/Discharge Planning: Goal: Ability to manage health-related needs will improve Outcome: Adequate for Discharge   Problem: Clinical Measurements: Goal: Ability to maintain clinical measurements within normal limits will improve Outcome: Progressing Goal: Will remain free from infection Outcome: Progressing Goal: Diagnostic test results will improve Outcome: Progressing Goal: Respiratory complications will improve Outcome: Progressing Goal: Cardiovascular complication will be avoided Outcome: Progressing   Problem: Activity: Goal: Risk for activity intolerance will decrease Outcome: Adequate for Discharge   Problem: Nutrition: Goal: Adequate nutrition will be maintained Outcome: Completed/Met   Problem: Coping: Goal: Level of anxiety will decrease Outcome: Progressing   Problem: Elimination: Goal: Will not experience complications related to bowel motility Outcome: Progressing Goal: Will not experience complications related to urinary retention Outcome: Completed/Met   Problem: Pain Managment: Goal: General experience of comfort will improve and/or be controlled Outcome: Progressing   Problem: Safety: Goal: Ability to remain free from injury will improve Outcome: Progressing   Problem: Skin Integrity: Goal: Risk for impaired skin integrity will decrease Outcome: Progressing   Problem: Clinical Measurements: Goal: Ability to avoid or minimize complications of infection will improve Outcome: Progressing   Problem: Skin Integrity: Goal: Skin integrity will improve Outcome: Progressing

## 2024-06-07 ENCOUNTER — Other Ambulatory Visit (HOSPITAL_COMMUNITY): Payer: Self-pay

## 2024-06-07 DIAGNOSIS — L03115 Cellulitis of right lower limb: Secondary | ICD-10-CM | POA: Diagnosis not present

## 2024-06-07 LAB — CULTURE, BLOOD (ROUTINE X 2)
Culture: NO GROWTH
Culture: NO GROWTH
Special Requests: ADEQUATE
Special Requests: ADEQUATE

## 2024-06-07 NOTE — Care Management Important Message (Signed)
 Important Message  Patient Details IM Letter given to the Patient. Name: ADEM COSTLOW MRN: [id number] Date of Birth: 11-01-1941   Important Message Given:  Yes - Medicare IM     Melba Ates 06/07/2024, 8:37 AM

## 2024-06-07 NOTE — Progress Notes (Signed)
 Discharge medication delivered to patient at bedside [facility]

## 2024-06-07 NOTE — TOC Transition Note (Signed)
 Transition of Care [facility]) - Discharge Note   Patient Details  Name: [name] MRN: [id number] Date of Birth: 05/06/1941  Transition of Care [facility]) CM/SW Contact:  Alfonse JONELLE Rex, RN Phone Number: 06/07/2024, 9:36 AM   Clinical Narrative:   DC to home order. [employer]-BSC, to be delivered to patient's home per patient request. Patient requesting assistance with transportation home as his friend who has to pick him up is having car trouble, agreeable to taxi, patient states he feels safe traveling in a taxi and will be able to get himself out of the taxi and into his home. Barrister's clerk and Liability reviewed with patient telephonically, pt voiced understanding.  OPPT arranged with [facility]. No further TOC needs identified at this time.     Final next level of care: Home/Self Care Barriers to Discharge: Barriers Resolved   Patient Goals and CMS Choice Patient states their goals for this hospitalization and ongoing recovery are:: return home          Discharge Placement                       Discharge Plan and Services Additional resources added to the After Visit Summary for                  DME Arranged: Bedside commode DME Agency: [employer] Date DME Agency Contacted: 06/06/24 Time DME Agency Contacted: [phone] Representative spoke with at DME Agency: [place]            Social Drivers of Health (SDOH) Interventions SDOH Screenings   Food Insecurity: No Food Insecurity (06/02/2024)  Housing: Low Risk  (06/02/2024)  Transportation Needs: No Transportation Needs (06/02/2024)  Utilities: Not At Risk (06/02/2024)  Depression (PHQ2-9): Medium Risk (08/09/2020)  Social Connections: Moderately Integrated (06/02/2024)  Tobacco Use: Low Risk  (06/01/2024)     Readmission Risk Interventions    06/07/2024    9:35 AM 06/03/2024   10:51 AM 04/10/2023   12:01 PM  Readmission Risk Prevention Plan   Transportation Screening Complete Complete Complete  PCP or Specialist Appt within 5-7 Days Complete Complete   Home Care Screening Complete Complete   Medication Review (RN CM) Complete Complete   Medication Review (RN Care Manager)   Complete  PCP or Specialist appointment within 3-5 days of discharge   Complete  HRI or Home Care Consult   Complete  SW Recovery Care/Counseling Consult   Complete  Palliative Care Screening   Not Applicable  Skilled Nursing Facility   Not Applicable

## 2024-06-07 NOTE — Plan of Care (Signed)
?  Problem: Skin Integrity: °Goal: Skin integrity will improve °Outcome: Progressing °  °Problem: Skin Integrity: °Goal: Skin integrity will improve °Outcome: Progressing °  °

## 2024-06-07 NOTE — Discharge Summary (Signed)
 Physician Discharge Summary  [name] FMW:[id number] DOB: Oct 25, 1941 DOA: 06/01/2024  PCP: Aisha Harvey, MD  Admit date: 06/01/2024 Discharge date: 06/07/2024  Admitted From: Home Disposition: Home  Recommendations for Outpatient Follow-up:  Follow up with PCP in 1-2 weeks  Home Health: Patient deferred despite recommendations (set up for outpatient PT) Equipment/Devices: Bedside commode  Discharge Condition: Stable CODE STATUS: Full Diet recommendation: Low-salt low-fat low-carb diet  Brief/Interim Summary: [name] is a 83 y.o. male with PMH significant for obesity, OSA on CPAP, prediabetes, HTN, HLD, CKD 3b, chronic back pain, chronic venous insufficiency, GERD, BPH, anxiety, depression Patient lives at home alone, able to ambulate independently, sometimes uses a walker -able to perform all of his ADLs independently.     7/21, patient was seen in the ED for few episodes of intermittent explosive diarrhea for 7 days.  Also reported painful rash on his right thigh.  He was noted to have right thigh shingles, started on Valtrex .  CT abdomen showed colonic diverticulosis without diverticulitis.  FOBT was positive but hemoglobin was normal.  Patient was discharged home on Valtrex .   7/23, patient returned back to the ED again with same symptoms.  He stated that the sores in his thigh have started to open up and the redness has spread. He had not picked up the prescription for Valtrex .  Also reported diarrhea persistent. Hospitalist called for admission.  Patient admitted as above with reports of persistent diarrhea however patient had very few bowel movements here, none over the previous 4 days.  Patient's GI symptoms improved with supportive care and treatment.  He was also noted at intake to have right lower extremity cellulitis which appear to be bacterial in nature, likely stemming from concurrent shingles infection of the same area (L3 distribution).  At this time patient's  cellulitis appears to be improving, transitioning from IV to p.o. antibiotics as well as p.o. antivirals in the setting of shingles.  Recommend close follow-up with PCP in the next 1 to 2 weeks for ongoing evaluation and treatment.  Discussed need for covering his active lesions as they are highly infectious.  Patient's chronic morbid conditions otherwise appear to be quite stable.  No medication changes as below other than initiation of doxycycline  and valacyclovir .  Patient remains medically stable for discharge - dispo yesterday (7/28) delayed due to social issues with access to his house.  Discharge Diagnoses:  Principal Problem:   Cellulitis  Acute right lower extremity cellulitis, POA Acute shingles outbreak, [name] - Patient presented with herpetic lesions on the right upper thigh distributed in a dermatomal pattern - medial right lower leg through inner thigh - Lower extremity cellulitis appears to be improving with decreased region of blanching erythema   Diarrhea, POA, resolved Now complaining of acute on chronic constipation -Patient is a poor historian in regards to timeline but reported 1 week of diarrhea initially, now appears to have had 1 bowel movement every 7 days which was quite loose. - Improving with MiraLAX  and Senokot as well as magnesium  citrate - Patient appears to have chronic constipation having bowel movements every 3 to 4 days at baseline, sometimes up to a week -recommend increased GI regimen at home - Abdominal x-ray unremarkable for any acute findings   Hypokalemia In the setting of GI losses, resolved, stable   Acute on chronic back pain -Chronic degenerative disc disease L4-S1 - Without neurological deficits - Early ambulation, heat/ice as appropriate - Continue home medications including acetaminophen , hydrocodone   Chronic venous insufficiency R>L Duplex is negative for DVT PTA meds- Lasix  40 mg daily Continue Lasix    HLD - Crestor  GERD - Continue  PPI, Mylanta BPH - Continue tamsulosin  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 OSA - Continue CPAP Anxiety/depression - Bupropion , Remeron  Prior right shoulder dislocation - POA, reduced 02/04/24 - chronic pain Generalized debility - PT/OT eval obtained.  Home with PT recommended -patient deferred, will set up for outpatient PT  Discharge Instructions  Discharge Instructions     Ambulatory referral to Physical Therapy   Complete by: As directed    Call MD for:  difficulty breathing, headache or visual disturbances   Complete by: As directed    Call MD for:  extreme fatigue   Complete by: As directed    Call MD for:  hives   Complete by: As directed    Call MD for:  persistant dizziness or light-headedness   Complete by: As directed    Call MD for:  persistant nausea and vomiting   Complete by: As directed    Call MD for:  severe uncontrolled pain   Complete by: As directed    Call MD for:  temperature >100.4   Complete by: As directed    Diet - low sodium heart healthy   Complete by: As directed    Increase activity slowly   Complete by: As directed       Allergies as of 06/07/2024   No Known Allergies      Medication List     TAKE these medications    Astepro  205.5 MCG/SPRAY Soln Generic drug: Azelastine  HCl Place 2 sprays into both nostrils as needed.   buPROPion  150 MG 24 hr tablet Commonly known as: WELLBUTRIN  XL Take 450 mg by mouth every morning.   CertaVite/Antioxidants Tabs Take 1 tablet by mouth every morning.   doxycycline  100 MG tablet Commonly known as: VIBRA -TABS Take 1 tablet (100 mg total) by mouth every 12 (twelve) hours for 8 days.   FiberCon 625 MG tablet Generic drug: polycarbophil Take 1 tablet (625 mg total) by mouth daily.   Flonase  Allergy Relief 50 MCG/ACT nasal spray Generic drug: fluticasone  Place 2 sprays into both nostrils as needed.   furosemide  40 MG tablet Commonly known as: LASIX  TAKE 1  TABLET BY MOUTH EVERY MORNING AND AN ADDITIONAL TABLET DAILY AS NEEDED FOR UNRESOLVED SWELLING OR FLUID RETENTION What changed: See the new instructions.   gabapentin  300 MG capsule Commonly known as: NEURONTIN  TAKE 1 TO 2 CAPSULES BY MOUTH AT BEDTIME What changed: See the new instructions.   HYDROcodone -acetaminophen  5-325 MG tablet Commonly known as: NORCO/VICODIN Take 1-2 tablets by mouth every 6 (six) hours as needed.   lactobacillus Pack Take 1 packet (1 g total) by mouth 3 (three) times daily with meals.   meclizine  25 MG tablet Commonly known as: ANTIVERT  Take 1 tablet (25 mg total) by mouth 3 (three) times daily as needed for dizziness.   methocarbamol  500 MG tablet Commonly known as: ROBAXIN  Take 1 tablet (500 mg total) by mouth every 8 (eight) hours as needed for muscle spasms. What changed: when to take this   mirtazapine  15 MG tablet Commonly known as: REMERON  Take 15 mg by mouth at bedtime.   montelukast  10 MG tablet Commonly known as: SINGULAIR  Take 1 tablet (10 mg total) by mouth at bedtime.   pantoprazole  40 MG tablet Commonly known as: PROTONIX  Take 1 tablet (40 mg total) by  mouth 2 (two) times daily before a meal.   polyethylene glycol 17 g packet Commonly known as: MIRALAX  / GLYCOLAX  Take 17 g by mouth 2 (two) times daily.   potassium chloride  SA 20 MEQ tablet Commonly known as: KLOR-CON  M Take 2 tablets (40 mEq total) by mouth 2 (two) times daily.   rosuvastatin  5 MG tablet Commonly known as: CRESTOR  Take 5 mg by mouth daily.   senna-docusate 8.6-50 MG tablet Commonly known as: Senokot-S Take 1 tablet by mouth 2 (two) times daily.   tamsulosin  0.4 MG Caps capsule Commonly known as: FLOMAX  Take 0.4 mg by mouth 2 (two) times daily.   valACYclovir  1000 MG tablet Commonly known as: VALTREX  Take 1 tablet (1,000 mg total) by mouth 3 (three) times daily.               Durable Medical Equipment  (From admission, onward)            Start     Ordered   06/06/24 1319  For home use only DME Bedside commode  Once       Question:  Patient needs a bedside commode to treat with the following condition  Answer:  Ambulatory dysfunction   06/06/24 1318            Follow-up Information     [facility] Follow up.   Why: Bedside Commode Contact information: [address] [place], [place] [address]   Phone: [phone]               No Known Allergies  Consultations: None  Procedures/Studies: DG Abd Portable 1V Result Date: 06/04/2024 CLINICAL DATA:  Constipation EXAM: PORTABLE ABDOMEN - 1 VIEW COMPARISON:  05/30/2024 CT scan FINDINGS: Body habitus reduces diagnostic sensitivity and specificity. Suspected mild subsegmental atelectasis at the lung bases. 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 Atherosclerosis is present, including aortoiliac atherosclerotic disease. Nonobstructive right nephrolithiasis observed with numerous right renal calculi. Left total hip prosthesis. Unremarkable bowel gas pattern. IMPRESSION: 1. Nonobstructive right nephrolithiasis. 2. 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 3. Aortic Atherosclerosis (ICD10-I70.0). Electronically Signed   By: Ryan Salvage M.D.   On: 06/04/2024 13:43   VAS US  LOWER EXTREMITY VENOUS (DVT) (ONLY [facility] & [facility]) Result Date: 06/02/2024  Lower Venous DVT Study Patient Name:  [name] [name]  Date of Exam:   06/02/2024 Medical Rec #: [id number]        Accession #:    [id number] Date of Birth: 05-03-1941        Patient Gender: M Patient Age:   7 years Exam Location:  [facility] Procedure:      VAS US  LOWER EXTREMITY VENOUS (DVT) Referring Phys: MAUDE DART --------------------------------------------------------------------------------  Indications: Pain, Swelling, and Erythema. Other Indications: Cellulits / herpes zoster infection. Risk Factors: Hx of venous insufficiency. Limitations: Poor ultrasound/tissue  interface and body habitus. Comparison Study: Previous exam on 01/14/2012 was negative for DVT Performing Technologist: Ezzie Potters RVT, RDMS  Examination Guidelines: A complete evaluation includes B-mode imaging, spectral Doppler, color Doppler, and power Doppler as needed of all accessible portions of each vessel. Bilateral testing is considered an integral part of a complete examination. Limited examinations for reoccurring indications may be performed as noted. The reflux portion of the exam is performed with the patient in reverse Trendelenburg.  +---------+---------------+---------+-----------+----------+--------------+ RIGHT    CompressibilityPhasicitySpontaneityPropertiesThrombus Aging +---------+---------------+---------+-----------+----------+--------------+ CFV      Full           Yes  Yes                                 +---------+---------------+---------+-----------+----------+--------------+ SFJ      Full                                                        +---------+---------------+---------+-----------+----------+--------------+ FV Prox  Full           Yes      Yes                                 +---------+---------------+---------+-----------+----------+--------------+ FV Mid   Full           Yes      Yes                                 +---------+---------------+---------+-----------+----------+--------------+ FV DistalFull           Yes      Yes                                 +---------+---------------+---------+-----------+----------+--------------+ PFV      Full                                                        +---------+---------------+---------+-----------+----------+--------------+ POP      Full           Yes      Yes                                 +---------+---------------+---------+-----------+----------+--------------+   +----+---------------+---------+-----------+----------+--------------+  LEFTCompressibilityPhasicitySpontaneityPropertiesThrombus Aging +----+---------------+---------+-----------+----------+--------------+ CFV Full           Yes      Yes                                 +----+---------------+---------+-----------+----------+--------------+     Summary: RIGHT: - There is no evidence of deep vein thrombosis in the lower extremity.  - No cystic structure found in the popliteal fossa. - Ultrasound characteristics of enlarged lymph nodes are noted in the groin. Subcutaneous edema in area of calf and ankle  LEFT: - No evidence of common femoral vein obstruction.   *See table(s) above for measurements and observations. Electronically signed by Lonni Gaskins MD on 06/02/2024 at 4:48:31 PM.    Final    DG Chest Portable 1 View Result Date: 06/01/2024 CLINICAL DATA:  Recently diagnosed with shingles, presenting with right-sided chest pain that radiates to the back and abdomen x2 days. EXAM: PORTABLE CHEST 1 VIEW COMPARISON:  February 03, 2024 FINDINGS: The heart size and mediastinal contours are within normal limits. Low lung volumes are noted. No acute infiltrate, pleural effusion or pneumothorax is identified. Multilevel degenerative changes are seen throughout the thoracic spine. IMPRESSION: No active disease.  Electronically Signed   By: Suzen Dials M.D.   On: 06/01/2024 20:24   CT ABDOMEN PELVIS WO CONTRAST Result Date: 05/30/2024 CLINICAL DATA:  Abdominal pain, acute, nonlocalized EXAM: CT ABDOMEN AND PELVIS WITHOUT CONTRAST TECHNIQUE: Multidetector CT imaging of the abdomen and pelvis was performed following the standard protocol without IV contrast. RADIATION DOSE REDUCTION: This exam was performed according to the departmental dose-optimization program which includes automated exposure control, adjustment of the mA and/or kV according to patient size and/or use of iterative reconstruction technique. COMPARISON:  CT abdomen pelvis 03/18/2023 FINDINGS: Lower chest:  No acute abnormality. Tiny hiatal hernia. Coronary artery calcification. Hepatobiliary: No focal liver abnormality. No gallstones, gallbladder wall thickening, or pericholecystic fluid. No biliary dilatation. Pancreas: No focal lesion. Normal pancreatic contour. No surrounding inflammatory changes. No main pancreatic ductal dilatation. Spleen: Normal in size without focal abnormality. Adrenals/Urinary Tract: No adrenal nodule bilaterally. Right nephrolithiasis measuring up to 8 mm. No left nephrolithiasis. No hydronephrosis. No definite contour-deforming renal mass. No ureterolithiasis or hydroureter. The urinary bladder is unremarkable. Stomach/Bowel: Stomach is within normal limits. No evidence of bowel wall thickening or dilatation. Colonic diverticulosis. Appendix appears normal. Vascular/Lymphatic: No abdominal aorta or iliac aneurysm. Severe atherosclerotic plaque of the aorta and its branches. No abdominal, pelvic, or inguinal lymphadenopathy. Reproductive: Prostate is unremarkable. Other: No intraperitoneal free fluid. No intraperitoneal free gas. No organized fluid collection. Musculoskeletal: No abdominal wall hernia or abnormality. No suspicious lytic or blastic osseous lesions. No acute displaced fracture. Levocurvature of the thoracolumbar spine. Multilevel degenerative changes of the spine. Multilevel intervertebral disc space vacuum phenomenon. Mild retrolisthesis of L1 on L2, L2 on L3 grade 1 anterolisthesis of L4 on L5. Total left hip arthroplasty. At least mild degenerative changes of the right hip. IMPRESSION: 1. Tiny hiatal hernia. 2. Colonic diverticulosis with no acute diverticulitis. 3. Nonobstructive right nephrolithiasis. 4.  Aortic Atherosclerosis (ICD10-I70.0). Electronically Signed   By: Morgane  Naveau M.D.   On: 05/30/2024 22:48     Subjective: No acute issues or events overnight   Discharge Exam: Vitals:   06/06/24 2129 06/07/24 0632  BP: (!) 155/73 (!) 152/116  Pulse: 77 68   Resp: 18 17  Temp: 97.8 F (36.6 C) 97.6 F (36.4 C)  SpO2: 95% 98%   Vitals:   06/06/24 0543 06/06/24 1414 06/06/24 2129 06/07/24 0632  BP: (!) 153/72 125/65 (!) 155/73 (!) 152/116  Pulse: 72 78 77 68  Resp: 16 20 18 17   Temp: 98.7 F (37.1 C) 97.7 F (36.5 C) 97.8 F (36.6 C) 97.6 F (36.4 C)  TempSrc:  Oral Oral Oral  SpO2: 96% 98% 95% 98%  Weight:      Height:        General:  Pleasantly resting in bed, No acute distress. HEENT:  Normocephalic atraumatic.  Sclerae nonicteric, noninjected.  Extraocular movements intact bilaterally. Neck:  Without mass or deformity.  Trachea is midline. Lungs:  Clear to auscultate bilaterally without rhonchi, wheeze, or rales. Heart:  Regular rate and rhythm.  Without murmurs, rubs, or gallops. Abdomen:  Soft, nontender, nondistended.  Without guarding or rebound. Extremities: Without cyanosis, clubbing, edema, or obvious deformity. Skin: Noted right lower extremity blanching erythema with concurrent vesicular rash whose pattern following dermatome to the inner thigh, crusted over without notable doming  The results of significant diagnostics from this hospitalization (including imaging, microbiology, ancillary and laboratory) are listed below for reference.     Microbiology: Recent Results (from the past 240 hours)  Blood culture (routine  x 2)     Status: None (Preliminary result)   Collection Time: 06/01/24  8:03 PM   Specimen: BLOOD LEFT ARM  Result Value Ref Range Status   Specimen Description   Final    BLOOD LEFT ARM Performed at [facility], [address] [place], [place] [address]    Special Requests   Final    BOTTLES DRAWN AEROBIC AND ANAEROBIC Blood Culture adequate volume Performed at [facility], [address] [place], [place] [address]    Culture   Final    NO GROWTH 4 DAYS Performed at [facility], [address], [place], [place] [address]    Report Status PENDING   Incomplete  Blood culture (routine x 2)     Status: None (Preliminary result)   Collection Time: 06/02/24  7:23 PM   Specimen: BLOOD RIGHT HAND  Result Value Ref Range Status   Specimen Description   Final    BLOOD RIGHT HAND Performed at [facility], [address], [place], [place] [address]    Special Requests   Final    BOTTLES DRAWN AEROBIC AND ANAEROBIC Blood Culture adequate volume Performed at [facility], [address] [place], [place] [address]    Culture   Final    NO GROWTH 4 DAYS Performed at [facility], [address], [place], [place] [address]    Report Status PENDING  Incomplete     Labs:  Basic Metabolic Panel: Recent Labs  Lab 06/01/24 1535 06/02/24 1637 06/03/24 0334 06/04/24 0340 06/05/24 0340  NA 139 138 140 138 136  K 2.9* 3.5 3.5 3.5 3.6  CL 100 97* 101 101 99  CO2 25 26 28 27 25   GLUCOSE 116* 112* 116* 105* 115*  BUN 20 17 16 18 16   CREATININE 1.98* 1.89* 1.79* 1.66* 1.50*  CALCIUM  9.2 9.2 8.9 8.8* 9.1  MG 1.8  --   --   --   --    Liver Function Tests: Recent Labs  Lab 06/01/24 1535  AST 54*  ALT 51*  ALKPHOS 115  BILITOT 1.2  PROT 7.5  ALBUMIN  4.1   Recent Labs  Lab 06/01/24 1535  LIPASE 61*   CBC: Recent Labs  Lab 06/01/24 1535 06/02/24 1637 06/03/24 0334 06/04/24 0340 06/05/24 0340  WBC 4.0 4.6 4.0 4.8 5.0  NEUTROABS 2.6  --  2.2 2.4 2.8  HGB 13.2 14.2 12.4* 12.1* 13.1  HCT 39.3 41.4 36.6* 34.9* 37.5*  MCV 96.1 96.3 96.6 96.7 96.9  PLT 166 168 140* 145* 139*   Urinalysis    Component Value Date/Time   COLORURINE STRAW (A) 06/01/2024 1646   APPEARANCEUR CLEAR 06/01/2024 1646   LABSPEC 1.006 06/01/2024 1646   PHURINE 6.0 06/01/2024 1646   GLUCOSEU NEGATIVE 06/01/2024 1646   HGBUR NEGATIVE 06/01/2024 1646   BILIRUBINUR NEGATIVE 06/01/2024 1646   BILIRUBINUR negative 05/19/2016 1228   BILIRUBINUR neg 04/16/2015 1812   KETONESUR NEGATIVE 06/01/2024 1646   PROTEINUR NEGATIVE  06/01/2024 1646   UROBILINOGEN 0.2 05/19/2016 1228   UROBILINOGEN 0.2 05/22/2015 2010   NITRITE NEGATIVE 06/01/2024 1646   LEUKOCYTESUR NEGATIVE 06/01/2024 1646   Sepsis Labs Recent Labs  Lab 06/02/24 1637 06/03/24 0334 06/04/24 0340 06/05/24 0340  WBC 4.6 4.0 4.8 5.0   Microbiology Recent Results (from the past 240 hours)  Blood culture (routine x 2)     Status: None (Preliminary result)   Collection Time: 06/01/24  8:03 PM   Specimen: BLOOD LEFT ARM  Result Value Ref Range Status   Specimen Description   Final    BLOOD LEFT ARM Performed at [facility], [address], [place], [place] [address]    Special Requests   Final    BOTTLES DRAWN AEROBIC AND ANAEROBIC Blood Culture adequate volume Performed at [facility], [address], [place], [place] [address]    Culture   Final    NO GROWTH 4 DAYS Performed at [facility], [address], [place], [place] [address]    Report Status PENDING  Incomplete  Blood culture (routine x 2)     Status: None (Preliminary result)   Collection Time: 06/02/24  7:23 PM   Specimen: BLOOD RIGHT HAND  Result Value Ref Range Status   Specimen Description   Final    BLOOD RIGHT HAND Performed at [facility], [address], [place], [place] [address]    Special Requests   Final    BOTTLES DRAWN AEROBIC AND ANAEROBIC Blood Culture adequate volume Performed at [facility], [address], [place], [place] [address]    Culture   Final    NO GROWTH 4 DAYS Performed at [facility], [address] [place], [place] [address]    Report Status PENDING  Incomplete     Time coordinating discharge: Over 30 minutes  SIGNED:   Elsie JAYSON Montclair, DO [facility] 06/07/2024, 8:20 AM Pager   If 7PM-7AM, please contact night-coverage [email or web address]

## 2024-06-08 ENCOUNTER — Telehealth: Payer: Self-pay | Admitting: Physical Medicine and Rehabilitation

## 2024-06-08 ENCOUNTER — Other Ambulatory Visit: Payer: Self-pay | Admitting: Physical Medicine and Rehabilitation

## 2024-06-08 DIAGNOSIS — M5416 Radiculopathy, lumbar region: Secondary | ICD-10-CM

## 2024-06-08 DIAGNOSIS — M48062 Spinal stenosis, lumbar region with neurogenic claudication: Secondary | ICD-10-CM

## 2024-06-08 NOTE — Telephone Encounter (Signed)
 Patient called and wants to be scheduled for Epidural shot. CB#[phone]

## 2024-06-13 ENCOUNTER — Ambulatory Visit (INDEPENDENT_AMBULATORY_CARE_PROVIDER_SITE_OTHER): Admitting: Podiatry

## 2024-06-13 ENCOUNTER — Ambulatory Visit (INDEPENDENT_AMBULATORY_CARE_PROVIDER_SITE_OTHER)

## 2024-06-13 DIAGNOSIS — M79661 Pain in right lower leg: Secondary | ICD-10-CM | POA: Diagnosis not present

## 2024-06-13 DIAGNOSIS — M67911 Unspecified disorder of synovium and tendon, right shoulder: Secondary | ICD-10-CM | POA: Diagnosis not present

## 2024-06-13 DIAGNOSIS — M25561 Pain in right knee: Secondary | ICD-10-CM | POA: Diagnosis not present

## 2024-06-13 DIAGNOSIS — L97522 Non-pressure chronic ulcer of other part of left foot with fat layer exposed: Secondary | ICD-10-CM | POA: Diagnosis not present

## 2024-06-13 DIAGNOSIS — L03115 Cellulitis of right lower limb: Secondary | ICD-10-CM | POA: Diagnosis not present

## 2024-06-13 DIAGNOSIS — I872 Venous insufficiency (chronic) (peripheral): Secondary | ICD-10-CM | POA: Diagnosis not present

## 2024-06-13 DIAGNOSIS — M25551 Pain in right hip: Secondary | ICD-10-CM | POA: Diagnosis not present

## 2024-06-14 MED ORDER — DOXYCYCLINE HYCLATE 100 MG PO TABS: 100.0000 mg | ORAL_TABLET | Freq: Two times a day (BID) | ORAL | 0 refills | Status: AC

## 2024-06-14 NOTE — Progress Notes (Signed)
 Subjective: Chief Complaint  Patient presents with   Foot Ulcer    Patient is here for left foot ulcer and una boot replacement patient was not wearing una boot on either foot     83 year old male, presents today for [employer] applications, bilateral lower extremity ulcerations.  Since I saw him last he states that he went to [place].  He came back he was admitted to the hospital was found to have shingles and other issues.  Has been having issues with mobility of his right leg since he was hospitalized he states.  He does not recall any injuries or falls since I saw him last.  Does not report any fevers or chills.   Objective: AAO x3, NAD DP/PT pulses palpable bilaterally, CRT less than 3 seconds Hyperkeratotic preulcerative lesion on the plantar aspect left foot with central wound present as noted below.  There is no probing to bone, undermining or tunneling.  There is no surrounding erythema, ascending cellulitis.  No drainage or pus.  Localized edema.   There are superficial areas of skin breakdown with some clear drainage mostly on the right leg.  Edema present bilaterally which has been chronic.  He has not been wearing any boots.  Erythema to bilateral legs but there is no fluctuation or crepitation. No pain with calf compression, swelling, warmth, erythema       Assessment: 83 year old male with venous insufficiency, left ulceration; leg ulcerations with cellulitis  Plan: Radiology: X-rays obtained reviewed of the left foot.  I get the best 3 views I was able to obtain today.  Chronic midfoot Charcot.  Significant arthritic changes present.  No definitive cortical changes suggest osteomyelitis. - Ordered bilateral foot and right ankle x-rays for [facility] imaging as as able not to get great x-rays in the office today.  Left foot ulceration - Medically necessary wound debridement performed today.  Sharply debrided hyperkeratotic tissue, ulceration with a #312 with scalpel down  to healthy, granular tissue.  There is no blood loss.  Tolerated well.  Prior to debridement the wound measured 0.5 x 0.1 x 0.3 cm.  After debridement the wound measured 1 x 0.3 x 0.4 cm.  Continue with saline.  Dressing applied.  Ulcerations, venous insufficiency - Unna boots were applied today.  Precautions advised when remove this. - Continue doxycycline . - Elevation - Monitor for any clinical signs or symptoms of infection and directed to call the office immediately should any occur or go to the ER.  No follow-ups on file.  Donnice JONELLE Fees DPM

## 2024-06-15 ENCOUNTER — Encounter: Payer: Self-pay | Admitting: Physical Medicine and Rehabilitation

## 2024-06-15 ENCOUNTER — Other Ambulatory Visit: Payer: Self-pay

## 2024-06-15 ENCOUNTER — Other Ambulatory Visit: Payer: Self-pay | Admitting: Orthopedic Surgery

## 2024-06-15 ENCOUNTER — Ambulatory Visit: Admitting: Physical Medicine and Rehabilitation

## 2024-06-15 DIAGNOSIS — M47816 Spondylosis without myelopathy or radiculopathy, lumbar region: Secondary | ICD-10-CM | POA: Diagnosis not present

## 2024-06-15 DIAGNOSIS — M4316 Spondylolisthesis, lumbar region: Secondary | ICD-10-CM | POA: Diagnosis not present

## 2024-06-15 DIAGNOSIS — M1711 Unilateral primary osteoarthritis, right knee: Secondary | ICD-10-CM | POA: Diagnosis not present

## 2024-06-15 DIAGNOSIS — M21371 Foot drop, right foot: Secondary | ICD-10-CM

## 2024-06-15 DIAGNOSIS — G8929 Other chronic pain: Secondary | ICD-10-CM

## 2024-06-15 DIAGNOSIS — L03115 Cellulitis of right lower limb: Secondary | ICD-10-CM | POA: Diagnosis not present

## 2024-06-15 DIAGNOSIS — M48062 Spinal stenosis, lumbar region with neurogenic claudication: Secondary | ICD-10-CM | POA: Diagnosis not present

## 2024-06-15 DIAGNOSIS — B029 Zoster without complications: Secondary | ICD-10-CM | POA: Diagnosis not present

## 2024-06-15 DIAGNOSIS — L97522 Non-pressure chronic ulcer of other part of left foot with fat layer exposed: Secondary | ICD-10-CM | POA: Diagnosis not present

## 2024-06-15 DIAGNOSIS — M5416 Radiculopathy, lumbar region: Secondary | ICD-10-CM

## 2024-06-15 DIAGNOSIS — Z6836 Body mass index (BMI) 36.0-36.9, adult: Secondary | ICD-10-CM | POA: Diagnosis not present

## 2024-06-15 NOTE — Patient Instructions (Signed)

## 2024-06-15 NOTE — Progress Notes (Signed)
 ROCHESTER SERPE - 83 y.o. male MRN [id number]  Date of birth: 20-Apr-1941  Office Visit Note: Visit Date: 06/15/2024 PCP: Aisha Harvey, MD Referred by: Aisha Harvey, MD  Subjective: Chief Complaint  Patient presents with   Lower Back - Pain   HPI: [name] is a 83 y.o. male who comes in today  for evaluation and management for chronic and worsening and severe low back pain with referral pain into both legs somewhat more left than right.  His history is well-known to us  as we have seen him over the years.  His course is complicated by significant vascular disease and recent hospitalization for cellulitis in the lower extremities.  He does usually have his lower extremities wrapped.  He has a known right foot drop.  He tells me today his back pain is really recalcitrant and just does not seem to be relieved with any medication or treatment today.  He has had intermittent success with epidural injection and facet blocks.  Lumbar spine is noted for progressive over the years very severe multilevel stenosis to the point of right foot drop.  He is ambulating with a walker.  Most of his symptoms are with standing and ambulating better at rest.  No new focal weakness.  No new recent falls or trauma.  Last time I saw him was in October of last year and completed epidural injection with some good relief for a few months.  He has had all manner of conservative and nonconservative care and is really not a surgical candidate at this point.   I spent more than 30 minutes speaking face-to-face with the patient with 50% of the time in counseling and discussing coordination of care.      Review of Systems  Musculoskeletal:  Positive for back pain, joint pain and neck pain.  Neurological:  Positive for tingling and focal weakness.  All other systems reviewed and are negative.  Otherwise per HPI.  Assessment & Plan: Visit Diagnoses:    ICD-10-CM   1. Lumbar radiculopathy  M54.16 XR C-ARM NO  REPORT    Epidural Steroid injection    2. Spinal stenosis of lumbar region with neurogenic claudication  M48.062 XR C-ARM NO REPORT    Epidural Steroid injection    3. Right foot drop  M21.371 XR C-ARM NO REPORT    Epidural Steroid injection    4. Spondylolisthesis of lumbar region  M43.16 XR C-ARM NO REPORT    Epidural Steroid injection    5. Facet hypertrophy of lumbar region  M47.816 XR C-ARM NO REPORT    Epidural Steroid injection       Plan: Findings:  Chronic severe low back and bilateral radicular leg pain with severe, very severe, multilevel lumbar central stenosis and foraminal stenosis and lateral recess stenosis.  Has done well with L4 transforaminal injections in the past and we will repeat that today.  Really not a lot of options for this patient other than potential for chronic pain management which she has never really wanted to try.  He has had surgical consideration in the past but is likely not a surgical candidate with his health problems at this point.  Will see how he does but would consider referral to chronic pain management.    Meds & Orders: No orders of the defined types were placed in this encounter.   Orders Placed This Encounter  Procedures   XR C-ARM NO REPORT   Epidural Steroid injection    Follow-up:  Return if symptoms worsen or fail to improve.   Procedures: No procedures performed  Lumbosacral Transforaminal Epidural Steroid Injection - Sub-Pedicular Approach with Fluoroscopic Guidance  Patient: [name]      Date of Birth: 1941/01/24 MRN: [id number] PCP: Aisha Harvey, MD      Visit Date: 06/15/2024   Universal Protocol:    Date/Time: 06/15/2024  Consent Given By: the patient  Position: PRONE  Additional Comments: Vital signs were monitored before and after the procedure. Patient was prepped and draped in the usual sterile fashion. The correct patient, procedure, and site was verified.   Injection Procedure Details:    Procedure diagnoses: Lumbar radiculopathy [M54.16]    Meds Administered: 40 mg Depo-Medrol   Laterality: Bilateral  Location/Site: L4  Needle:5.0 in., 22 ga.  Short bevel or Quincke spinal needle  Needle Placement: Transforaminal  Findings:    -Comments: Excellent flow of contrast along the nerve, nerve root and into the epidural space.  Right sided injection was without really difficulty at all except for normal needling around the osteophyte.  Left-sided injection had very tight foraminal space that he did get symptoms with the injectate down into the leg and foot but this did resolve.  This is typical finding we do have stenosis.  Procedure Details: After squaring off the end-plates to get a true AP view, the C-arm was positioned so that an oblique view of the foramen as noted above was visualized. The target area is just inferior to the nose of the scotty dog or sub pedicular. The soft tissues overlying this structure were infiltrated with 2-3 ml. of 1% Lidocaine  without Epinephrine .  The spinal needle was inserted toward the target using a trajectory view along the fluoroscope beam.  Under AP and lateral visualization, the needle was advanced so it did not puncture dura and was located close the 6 O'Clock position of the pedical in AP tracterory. Biplanar projections were used to confirm position. Aspiration was confirmed to be negative for CSF and/or blood. A 1-2 ml. volume of Isovue -250 was injected and flow of contrast was noted at each level. Radiographs were obtained for documentation purposes.   After attaining the desired flow of contrast documented above, a 0.5 to 1.0 ml test dose of 0.25% Marcaine  was injected into each respective transforaminal space.  The patient was observed for 90 seconds post injection.  After no sensory deficits were reported, and normal lower extremity motor function was noted,   the above injectate was administered so that equal amounts of the  injectate were placed at each foramen (level) into the transforaminal epidural space.   Additional Comments:  No complications occurred Dressing: 2 x 2 sterile gauze and Band-Aid    Post-procedure details: Patient was observed during the procedure. Post-procedure instructions were reviewed.  Patient left the clinic in stable condition.    Clinical History: CT LUMBAR MYELOGRAM FINDINGS:     T10-11: No stenosis. Facet degeneration and ligamentous  calcification. Solid bridging anterior osteophytes.     T11-12: No stenosis.     T12-L1: Mild bulging of the disc. No compressive stenosis. Conus tip  at this level.     L1-2: 2 mm retrolisthesis. Moderate multifactorial spinal stenosis.  Disc space narrowing. Endplate osteophytes. Facet and ligamentous  hypertrophy. Foraminal stenosis on both sides that could compress  the L1 nerves.     L2-3: 2 mm retrolisthesis. Chronic disc degeneration with loss of  disc height and vacuum phenomenon. Endplate osteophytes. Facet and  ligamentous hypertrophy. Severe  multifactorial spinal stenosis  likely to cause neural compression. Bilateral foraminal stenosis  that could compress the L2 nerves.     L3-4: 2 mm retrolisthesis. Endplate osteophytes and bulging of the  disc. Facet and ligamentous hypertrophy. Severe multifactorial  spinal stenosis likely to cause neural compression. Bilateral  foraminal stenosis that could affect the exiting L3 nerves.     L4-5: Chronic facet arthropathy with anterolisthesis of 1 cm. Disc  degeneration with disc space narrowing and vacuum phenomenon.  Endplate osteophytes and bulging of the disc. Severe stenosis of the  central canal and both neural foramina likely to cause neural  compression.     L5-S1: Chronic disc degeneration with loss of disc height and vacuum  phenomenon. Endplate osteophytes. Facet hypertrophy and degeneration  with some ligamentous calcification. Severe stenosis of the   subarticular lateral recesses and neural foramina.     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 Curvature convex to the left in the thoracolumbar  junction region and towards the right in the lower lumbar region. 2  mm retrolisthesis L1-2, L2-3 and L3-4. 1 cm anterolisthesis L4-5.     Severe multifactorial spinal stenosis and neural foraminal stenosis  likely to cause neural compression at L2-3, L3-4, L4-5 and L5-S1.  Moderate multifactorial canal stenosis at L1-2. Bilateral foraminal  stenosis at that level.        Electronically Signed    By: Oneil Officer M.D.    On: 12/06/2019 10:12   He reports that he has never smoked. He has never used smokeless tobacco. No results for input(s): HGBA1C, LABURIC in the last 8760 hours.  Objective:  VS:  HT:    WT:   BMI:     BP:   HR: bpm  TEMP: ( )  RESP:  Physical Exam Vitals and nursing note reviewed.  Constitutional:      General: He is not in acute distress.    Appearance: Normal appearance. He is well-developed. He is obese.  HENT:     Head: Normocephalic and atraumatic.  Eyes:     Conjunctiva/sclera: Conjunctivae normal.     Pupils: Pupils are equal, round, and reactive to light.  Cardiovascular:     Rate and Rhythm: Normal rate.     Pulses: Normal pulses.     Heart sounds: Normal heart sounds.  Pulmonary:     Effort: Pulmonary effort is normal. No respiratory distress.  Musculoskeletal:        General: Tenderness present.     Cervical back: Normal range of motion and neck supple. No rigidity.     Right lower leg: No edema.     Left lower leg: No edema.     Comments: Patient rises from a seated position with great difficulty.  He ambulates with a forward flexed lumbar spine.  He ambulates with antalgic gait and steppage gait on the right.  He has foot drop 1 out of 5 strength with dorsiflexion.  He has good plantarflexion.  His lower extremities are wrapped.  He does  have a level of venous stasis disease and edema.  He has no pain with hip rotation.  Skin:    General: Skin is warm and dry.     Findings: No erythema or rash.  Neurological:     General: No focal deficit present.     Mental Status: He is alert and oriented to person, place, and time.     Cranial Nerves: No cranial nerve deficit.  Sensory: Sensory deficit present.     Motor: Weakness present.     Coordination: Coordination normal.     Gait: Gait abnormal.  Psychiatric:        Mood and Affect: Mood normal.        Behavior: Behavior normal.     Ortho Exam  Imaging: No results found.  Past Medical/Family/Surgical/Social History: Medications & Allergies reviewed per EMR, new medications updated. 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   Aspiration pneumonia (HCC) 04/07/2023   Asthma 04/07/2023   Acute respiratory failure with hypoxia (HCC) 11/18/2022   OSA (obstructive sleep apnea) 11/18/2022   Pulmonary nodules 11/18/2022   Blood in stool 11/18/2022   Influenza A with pneumonia 11/13/2022   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/2022   Primary osteoarthritis of left hip 04/23/2022   Osteomyelitis of great toe of right foot (HCC) 04/18/2022   Left hip pain 04/09/2022   Upper airway cough syndrome 02/03/2022   Acute rhinosinusitis 01/24/2022   Hypertension 09/30/2021   Allergic rhinitis due to pollen 03/25/2021   Benign prostatic hyperplasia with lower urinary tract symptoms 03/25/2021   Chronic fatigue syndrome 03/25/2021   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   Stage 3b chronic kidney disease (CKD) (HCC) 03/25/2021   Chronic pain 03/25/2021   Dyslipidemia 03/25/2021   Edema 03/25/2021   Generalized anxiety disorder 03/25/2021   Mild persistent asthma 03/25/2021   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   Obesity (BMI 30-39.9) 03/25/2021   Non-pressure chronic  ulcer of other part of right foot limited to breakdown of skin (HCC) 03/25/2021   Prediabetes 03/25/2021   Primary insomnia 03/25/2021   Sleep disturbance 03/25/2021   Paresthesia of skin 06/08/2020   Right foot drop 06/08/2020   Body mass index (BMI) 35.0-35.9, adult 03/13/2020   Spondylosis of cervical region without myelopathy or radiculopathy 01/05/2020   Elevated blood-pressure reading, without diagnosis of hypertension 12/07/2019   Spinal stenosis of lumbar region with neurogenic claudication 01/15/2017   Spondylolisthesis of lumbar region 01/15/2017   Claw toe, acquired, left 01/06/2017   Achilles tendon contracture, bilateral 01/06/2017   Pain in metatarsus of both feet 01/06/2017   Tubular adenoma of colon 12/29/2016   Basal cell carcinoma 08/19/2016   Umbilical hernia s/p lap repair with mesh 06/27/2016 06/27/2016   Degenerative arthritis of knee 03/22/2015   Dyspnea on exertion 01/31/2015   Lumbar radiculopathy 12/12/2014   Facet hypertrophy of lumbar region 12/12/2014   Left knee pain 12/12/2014   Ulcer of lower limb (HCC) 01/10/2014   Varicose veins of bilateral lower extremities with other complications 01/10/2014   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 12/21/2013   Renal insufficiency 12/21/2013   Swelling in head/neck 07/28/2012   Venous (peripheral) insufficiency 05/25/2012   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   Pain in limb 02/12/2012   Hyperlipidemia 02/28/2008   Allergic rhinitis 02/28/2008   Sleep apnea 02/28/2008   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   L4, L5 ,S1   Depression  takes Wellbutrin  daily   Dyspnea on exertion    with exertion    GERD (gastroesophageal reflux disease)    takes Nexium  and Omeprazole daily   History of kidney stones    HTN (hypertension)    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   Hyperlipidemia    Joint pain    Neuromuscular disorder (HCC)    Pre-diabetes    Prediabetes    Sleep apnea    uses CPAP nightly   SOB (shortness of breath)    Swallowing difficulty    Swelling of lower extremity    right more than leg leg   Umbilical hernia    Family History  Problem Relation Age of Onset   Thyroid  disease Mother    Heart disease Father    Hypertension Father    Diabetes Maternal Grandfather    Colon cancer Neg Hx    Esophageal cancer Neg Hx    Past Surgical History:  Procedure Laterality Date   ABDOMINAL AORTOGRAM W/LOWER EXTREMITY N/A 03/14/2020   Procedure: ABDOMINAL AORTOGRAM W/LOWER EXTREMITY;  Surgeon: Serene Gaile ORN, MD;  Location: [facility];  Service: Vascular;  Laterality: N/A;   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   AMPUTATION TOE Right 04/18/2022   Procedure: AMPUTATION RIGHT GREAT TOE;  Surgeon: Yvone Rush, MD;  Location: [facility];  Service: Orthopedics;  Laterality: Right;   BACK SURGERY  2003   L4, L5   BACK SURGERY     CATARACT EXTRACTION, BILATERAL     CYSTOSCOPY/URETEROSCOPY/HOLMIUM LASER/STENT PLACEMENT Left 12/29/2023   Procedure: CYSTOSCOPY/ LEFT URETEROSCOPY/HOLMIUM LASER/STENT PLACEMENT;  Surgeon: Watt Rush, MD;  Location: [facility];  Service: Urology;  Laterality: Left;  60 MINUTE CASE   ENDOVENOUS ABLATION SAPHENOUS VEIN W/ LASER Right 06-01-2014   EVLA right greater saphenous vein by Krystal Doing MD    epidural steroid injections        [facility] dr Gabbrielle Mcnicholas   hernia repair  2003   HERNIA REPAIR     hernia inguinal x 2   TOTAL HIP ARTHROPLASTY Left 05/05/2022   Procedure: LEFT TOTAL HIP ARTHROPLASTY ANTERIOR APPROACH;  Surgeon: Yvone Rush, MD;  Location: [facility];  Service: Orthopedics;  Laterality: Left;   TOTAL KNEE  ARTHROPLASTY Left 03/22/2015   Procedure: TOTAL KNEE ARTHROPLASTY;  Surgeon: Glendia Cordella Hutchinson, MD;  Location: [facility];  Service: Orthopedics;  Laterality: Left;   UMBILICAL HERNIA REPAIR N/A 06/27/2016   Procedure: LAPAROSCOPIC ASSISTED REPAIR OF UMBILICAL HERINA WITH MESH;  Surgeon: Donnice Lunger, MD;  Location: [facility];  Service: General;  Laterality: N/A;   VASCULAR SURGERY  2015   right leg   Social History   Occupational History   Occupation: Clinical biochemist: RETIRED   Occupation: retired  Tobacco Use   Smoking status: Never   Smokeless tobacco: Never  Vaping Use   Vaping status: Never Used  Substance and Sexual Activity   Alcohol use: Yes    Comment: rare   Drug use: No   Sexual activity: Yes    Birth control/protection: Condom

## 2024-06-15 NOTE — Progress Notes (Signed)
 Pain Scale   Average Pain 8 Patient advising he has lower back pain that is constant and no relief.        +Driver, -BT, -Dye Allergies.

## 2024-06-20 ENCOUNTER — Ambulatory Visit: Admitting: Podiatry

## 2024-06-20 ENCOUNTER — Ambulatory Visit
Admission: RE | Admit: 2024-06-20 | Discharge: 2024-06-20 | Disposition: A | Discharge status: Home / Self Care | Source: Ambulatory Visit | Attending: Orthopedic Surgery | Admitting: Orthopedic Surgery

## 2024-06-20 DIAGNOSIS — G8929 Other chronic pain: Secondary | ICD-10-CM

## 2024-06-21 ENCOUNTER — Encounter: Payer: Self-pay | Admitting: Podiatry

## 2024-06-21 ENCOUNTER — Ambulatory Visit (INDEPENDENT_AMBULATORY_CARE_PROVIDER_SITE_OTHER): Admitting: Podiatry

## 2024-06-21 DIAGNOSIS — I872 Venous insufficiency (chronic) (peripheral): Secondary | ICD-10-CM

## 2024-06-21 DIAGNOSIS — L03115 Cellulitis of right lower limb: Secondary | ICD-10-CM

## 2024-06-21 DIAGNOSIS — L97522 Non-pressure chronic ulcer of other part of left foot with fat layer exposed: Secondary | ICD-10-CM | POA: Diagnosis not present

## 2024-06-21 MED ORDER — DOXYCYCLINE HYCLATE 100 MG PO TABS: 100.0000 mg | ORAL_TABLET | Freq: Two times a day (BID) | ORAL | 0 refills | Status: DC

## 2024-06-23 ENCOUNTER — Other Ambulatory Visit: Payer: Self-pay | Admitting: Orthopedic Surgery

## 2024-06-23 DIAGNOSIS — M67911 Unspecified disorder of synovium and tendon, right shoulder: Secondary | ICD-10-CM | POA: Diagnosis not present

## 2024-06-23 DIAGNOSIS — M1611 Unilateral primary osteoarthritis, right hip: Secondary | ICD-10-CM | POA: Diagnosis not present

## 2024-06-23 DIAGNOSIS — M545 Low back pain, unspecified: Secondary | ICD-10-CM

## 2024-06-23 DIAGNOSIS — M25561 Pain in right knee: Secondary | ICD-10-CM | POA: Diagnosis not present

## 2024-06-24 NOTE — Progress Notes (Signed)
 Subjective: Chief Complaint  Patient presents with   Foot Ulcer    left plantar ulceration; leg with cellulitis. Wearing Unna boots.      83 year old male, presents today for [employer] applications, bilateral lower extremity ulcerations.  He has been doing well.  Still concerned about weakness of his right leg.  He states that he previously seen orthopedics for this issue.  This does not seem to be new.  He currently denies any fevers or chills.  Objective: AAO x3, NAD DP/PT pulses palpable bilaterally, CRT less than 3 seconds On the plantar aspect left foot in the arch hyperkeratotic lesion with central granular wound.  The wound appears to be smaller.  There is no probing to Mon[place] or tunneling.  There is no surrounding erythema, ascending cellulitis.  No fluctuation or crepitation.  No malodor.  No obvious signs of infection noted to the wound itself. There is decreased edema to bilateral lower extremities with still some erythema which I think is a component of possible cellulitis versus also some venous stasis changes. No pain with calf compression, swelling, warmth, erythema       Assessment: 83 year old male with venous insufficiency, left ulceration; leg ulcerations with resolving cellulitis  Plan:  Left foot ulceration - Medically necessary wound debridement performed today.  Sharply debrided hyperkeratotic tissue, ulceration with a #312 with scalpel down to healthy, granular tissue.  There is no blood loss.  Tolerated well.  Wound measures approximate 0.5 x 0.3 x 0.2 cm without any probing, undermining or tunneling.  Dressing applied today.  Ulcerations, venous insufficiency - Unna boots were applied today.  Precautions advised when remove this. - Continue doxycycline . - Elevation - Monitor for any clinical signs or symptoms of infection and directed to call the office immediately should any occur or go to the ER.  Return in about 1 week (around 06/28/2024) for unna boot,  left foot ulcer.   Donnice JONELLE Fees DPM

## 2024-06-26 NOTE — Procedures (Signed)
 Lumbosacral Transforaminal Epidural Steroid Injection - Sub-Pedicular Approach with Fluoroscopic Guidance  Patient: [name]      Date of Birth: 1941-07-24 MRN: [id number] PCP: Aisha Harvey, MD      Visit Date: 06/15/2024   Universal Protocol:    Date/Time: 06/15/2024  Consent Given By: the patient  Position: PRONE  Additional Comments: Vital signs were monitored before and after the procedure. Patient was prepped and draped in the usual sterile fashion. The correct patient, procedure, and site was verified.   Injection Procedure Details:   Procedure diagnoses: Lumbar radiculopathy [M54.16]    Meds Administered: 40 mg Depo-Medrol   Laterality: Bilateral  Location/Site: L4  Needle:5.0 in., 22 ga.  Short bevel or Quincke spinal needle  Needle Placement: Transforaminal  Findings:    -Comments: Excellent flow of contrast along the nerve, nerve root and into the epidural space.  Right sided injection was without really difficulty at all except for normal needling around the osteophyte.  Left-sided injection had very tight foraminal space that he did get symptoms with the injectate down into the leg and foot but this did resolve.  This is typical finding we do have stenosis.  Procedure Details: After squaring off the end-plates to get a true AP view, the C-arm was positioned so that an oblique view of the foramen as noted above was visualized. The target area is just inferior to the nose of the scotty dog or sub pedicular. The soft tissues overlying this structure were infiltrated with 2-3 ml. of 1% Lidocaine  without Epinephrine .  The spinal needle was inserted toward the target using a trajectory view along the fluoroscope beam.  Under AP and lateral visualization, the needle was advanced so it did not puncture dura and was located close the 6 O'Clock position of the pedical in AP tracterory. Biplanar projections were used to confirm position. Aspiration was confirmed  to be negative for CSF and/or blood. A 1-2 ml. volume of Isovue -250 was injected and flow of contrast was noted at each level. Radiographs were obtained for documentation purposes.   After attaining the desired flow of contrast documented above, a 0.5 to 1.0 ml test dose of 0.25% Marcaine  was injected into each respective transforaminal space.  The patient was observed for 90 seconds post injection.  After no sensory deficits were reported, and normal lower extremity motor function was noted,   the above injectate was administered so that equal amounts of the injectate were placed at each foramen (level) into the transforaminal epidural space.   Additional Comments:  No complications occurred Dressing: 2 x 2 sterile gauze and Band-Aid    Post-procedure details: Patient was observed during the procedure. Post-procedure instructions were reviewed.  Patient left the clinic in stable condition.

## 2024-06-27 ENCOUNTER — Ambulatory Visit: Admitting: Podiatry

## 2024-06-28 ENCOUNTER — Telehealth: Payer: Self-pay | Admitting: Podiatry

## 2024-06-28 NOTE — Telephone Encounter (Signed)
 Patient called- he missed appointment for unna boot change and is wanting to know when he can get back in for the appointment. Next availability is in September. Please advise when you would like patient rescheduled. Thanks

## 2024-06-29 ENCOUNTER — Ambulatory Visit (INDEPENDENT_AMBULATORY_CARE_PROVIDER_SITE_OTHER): Admitting: Podiatry

## 2024-06-29 DIAGNOSIS — L97522 Non-pressure chronic ulcer of other part of left foot with fat layer exposed: Secondary | ICD-10-CM

## 2024-06-29 DIAGNOSIS — I890 Lymphedema, not elsewhere classified: Secondary | ICD-10-CM | POA: Diagnosis not present

## 2024-06-29 DIAGNOSIS — I872 Venous insufficiency (chronic) (peripheral): Secondary | ICD-10-CM | POA: Diagnosis not present

## 2024-06-29 NOTE — Progress Notes (Signed)
 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  He normally sees Dr. Gershon.  He has finished his doxycycline .  No fever or chills or nausea or vomiting.  Has tolerated the Unna boots well  Physical Exam:  Patient alert and oriented x 3.  No complaints of nausea, vomiting, fever, or chills  Vascular: DP pulses 2/4 bilateral. PT pulses 2/4 lateral.  Decreased edema left leg.  Some decrease in edema in right leg.. Capillary fill time immediate..  Dermatologic: Thickness ulceration plantar central foot left.  Mild to moderate clear drainage with no purulence odors or signs of infection.SABRA Burtis base of granulation tissue.  Predebridement debridement ulcer measures 4 mm wide x 4 mm long x 2 to deep.  Neurologic:   Musculoskeletal:     Diagnoses: 1.  Full-thickness Wagner grade 2 ulceration plantar left foot.. 2.  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  Ulcer appears to be improving.  Will apply Unna boots today bilaterally.  Continue elevating is much as possible.  -Debridement ulceration plantar central left foot.  Sharply debrided ulceration of all devitalized tissue with 312 blade and tissue nippers.  Minimal blood loss.  After debridement movement wound measured 5 mm in diameter and 3 mm deep.  Applied dressing.  Applied Unna boots bilaterally.  Applied [employer] with a covering of Covan and an Ace wrap to help secure dressing.  Discussed precautions with the Unna boot that he needs to take.  Return 2 weeks f/u ulcer and Unna boots with Dr. Alona

## 2024-07-04 ENCOUNTER — Ambulatory Visit
Admission: RE | Admit: 2024-07-04 | Discharge: 2024-07-04 | Disposition: A | Discharge status: Home / Self Care | Source: Ambulatory Visit | Attending: Orthopedic Surgery | Admitting: Orthopedic Surgery

## 2024-07-04 DIAGNOSIS — M545 Low back pain, unspecified: Secondary | ICD-10-CM

## 2024-07-06 ENCOUNTER — Encounter: Admitting: Physical Medicine and Rehabilitation

## 2024-07-06 ENCOUNTER — Telehealth: Payer: Self-pay | Admitting: Podiatry

## 2024-07-06 NOTE — Telephone Encounter (Signed)
 Called and left message for patient in regards to his appointment tomorrow as a reminder.

## 2024-07-07 ENCOUNTER — Ambulatory Visit: Admitting: Podiatry

## 2024-07-08 ENCOUNTER — Ambulatory Visit (INDEPENDENT_AMBULATORY_CARE_PROVIDER_SITE_OTHER): Admitting: Podiatry

## 2024-07-08 ENCOUNTER — Encounter: Payer: Self-pay | Admitting: Podiatry

## 2024-07-08 DIAGNOSIS — I872 Venous insufficiency (chronic) (peripheral): Secondary | ICD-10-CM

## 2024-07-08 DIAGNOSIS — L97522 Non-pressure chronic ulcer of other part of left foot with fat layer exposed: Secondary | ICD-10-CM

## 2024-07-08 NOTE — Progress Notes (Signed)
 Subjective: Chief Complaint  Patient presents with   Wound Check    unna boot change/ L foot ulcer plantar. 5 pain.     83 year old male, presents today for [employer] applications, bilateral lower extremity ulcerations.  He missed his appointment today presented today.  He presents here for [employer] change.  He denies any increased pain.  No fevers or chills.  No other concerns.  Objective: AAO x3, NAD DP/PT pulses palpable bilaterally, CRT less than 3 seconds On the plantar aspect left foot in the arch hyperkeratotic lesion with central granular wound.  Wound measures approximate 0.6 x 0.5 x 0.3 cm without any probing, undermining or tunneling.  There is no surrounding erythema, ascending cellulitis.  No fluctuation, crepitation or malodor. There is decreased edema present bilaterally.  There is some edema to the forefoot bilaterally. No pain with calf compression, swelling, warmth, erythema  Assessment: 83 year old male with venous insufficiency, left ulceration; leg ulcerations with resolving cellulitis  Plan:  Left foot ulceration - Today I cleaned the wound and I sharply debrided hyperkeratotic tissue.  Given ongoing nature of the wounds I would refer him to the wound care center.  Referral placed today.  Ulcerations, venous insufficiency - Unna boots were applied today.  Precautions advised when remove this. - Elevation - Monitor for any clinical signs or symptoms of infection and directed to call the office immediately should any occur or go to the ER.  Return in about 1 week (around 07/15/2024) for unna boot, ulcer left foot.  [name] Fees DPM

## 2024-07-14 ENCOUNTER — Telehealth: Payer: Self-pay | Admitting: Podiatry

## 2024-07-14 NOTE — Telephone Encounter (Signed)
 Error

## 2024-07-15 DIAGNOSIS — M47816 Spondylosis without myelopathy or radiculopathy, lumbar region: Secondary | ICD-10-CM | POA: Diagnosis not present

## 2024-07-15 DIAGNOSIS — M5126 Other intervertebral disc displacement, lumbar region: Secondary | ICD-10-CM | POA: Diagnosis not present

## 2024-07-19 ENCOUNTER — Telehealth: Payer: Self-pay | Admitting: Podiatry

## 2024-07-19 ENCOUNTER — Encounter: Payer: Self-pay | Admitting: Podiatry

## 2024-07-19 ENCOUNTER — Ambulatory Visit (INDEPENDENT_AMBULATORY_CARE_PROVIDER_SITE_OTHER): Admitting: Podiatry

## 2024-07-19 DIAGNOSIS — I872 Venous insufficiency (chronic) (peripheral): Secondary | ICD-10-CM

## 2024-07-19 DIAGNOSIS — L97522 Non-pressure chronic ulcer of other part of left foot with fat layer exposed: Secondary | ICD-10-CM | POA: Diagnosis not present

## 2024-07-19 MED ORDER — SILVER SULFADIAZINE 1 % EX CREA: 1.0000 | TOPICAL_CREAM | Freq: Every day | CUTANEOUS | 0 refills | Status: DC

## 2024-07-19 NOTE — Progress Notes (Signed)
 Subjective: Chief Complaint  Patient presents with   Foot Ulcer    Rm12 f/u unna boot bilateral.     83 year old male, presents today for [employer] boot applications, bilateral lower extremity ulcerations.  States he has had some pain over the last week.  Does not report any fevers or chills.  He states the enemas been very helpful in the right side.  He does have an appointment scheduled for the wound care center later this month.  Objective: AAO x3, NAD DP/PT pulses palpable bilaterally, CRT less than 3 seconds On the plantar aspect left foot in the arch hyperkeratotic lesion with central granular wound.  Wound measures approximate 0.8 x 0.5 x 0.3 cm without any probing, undermining or tunneling.  There is no surrounding erythema, ascending cellulitis.  No fluctuation, crepitation or malodor. Chronic bilateral lower extremity edema present. No pain with calf compression, swelling, warmth, erythema  Assessment: 83 year old male with venous insufficiency, left ulceration; leg ulcerations with resolving cellulitis  Plan:  Left foot ulceration - Today I cleaned the wound and I sharply debrided hyperkeratotic tissue.  I do think he needs more advanced wound care given the history of Charcot with a plantar wound.  I discussed with him previously try get home health care arranged but he is not able to do this for various issues he reports.  I have offered this on several occasions.  The wound does not seem to be getting better with the Unna boot so that the change in step today.  Silvadene  applied followed by dressing.  I like him to try to change the dressing.  He states he will try to find something to help through this. -Monitor for any clinical signs or symptoms of infection and directed to call the office immediately should any occur or go to the ER.   Ulcerations, venous insufficiency - Unna boots were applied today to the right side.  Precautions advised when remove this. - Elevation -  Monitor for any clinical signs or symptoms of infection and directed to call the office immediately should any occur or go to the ER.   Donnice JONELLE Fees DPM

## 2024-07-19 NOTE — Telephone Encounter (Signed)
 Patient called saying he had an appointment at 10:15 and that he was on the side of the road with a flat tire and would not be able to come for his appointment anytime soon. Patient stated he is free once his tire is fixed and can come anytime today.

## 2024-07-21 ENCOUNTER — Encounter (HOSPITAL_BASED_OUTPATIENT_CLINIC_OR_DEPARTMENT_OTHER): Admitting: Internal Medicine

## 2024-07-21 DIAGNOSIS — L97529 Non-pressure chronic ulcer of other part of left foot with unspecified severity: Secondary | ICD-10-CM | POA: Insufficient documentation

## 2024-07-21 DIAGNOSIS — R7303 Prediabetes: Secondary | ICD-10-CM | POA: Insufficient documentation

## 2024-07-21 DIAGNOSIS — M14672 Charcot's joint, left ankle and foot: Secondary | ICD-10-CM | POA: Insufficient documentation

## 2024-07-28 ENCOUNTER — Telehealth: Payer: Self-pay | Admitting: Podiatry

## 2024-07-28 ENCOUNTER — Ambulatory Visit: Admitting: Podiatry

## 2024-07-28 NOTE — Telephone Encounter (Signed)
 Patient has car trouble and won't make it to today's appointment. He's coming in for a 1 week ulcer check. Is it okay to schedule him on October 1 ?

## 2024-07-28 NOTE — Telephone Encounter (Signed)
 Patient has car trouble and cannot make it to today's appointment. You have an opening on 9/30. Is it okay to schedule him then? He's coming in for a 1 week ulcer check

## 2024-07-28 NOTE — Telephone Encounter (Signed)
 Please disregard previous message since he is a patient of Dr.Wagoner

## 2024-08-01 ENCOUNTER — Telehealth: Payer: Self-pay | Admitting: Pharmacist

## 2024-08-01 ENCOUNTER — Ambulatory Visit: Admitting: Podiatry

## 2024-08-01 NOTE — Progress Notes (Signed)
   08/01/2024  Patient ID: [name], male   DOB: 1941/09/17, 83 y.o.   MRN: [id number]  Called patient on 9/8 and 9/11- left voicemail on both occasions. Received referral from Dr. Aisha to follow-up with Ozempic titration and DM management after the PAP application has been approved/ set-up.  Called and spoke with the patient on the phone today. Said he was heading into a meeting, so the call had to be kept short.  Will check his stack of mail to see if the application is in there. Will give us  a call back at the office if he does not find it for next steps.   Of note, reports having cramps in the upper gut frequently and blood in the stool on several occasions over the past week. Does not know if he should see the ER or discuss with GI?  Message sent to Dr. Aisha to decide next steps.   [name], PharmD [facility]  Phone Number: [phone]

## 2024-08-04 ENCOUNTER — Telehealth: Payer: Self-pay | Admitting: Podiatry

## 2024-08-04 ENCOUNTER — Telehealth: Payer: Self-pay | Admitting: Gastroenterology

## 2024-08-04 DIAGNOSIS — M25551 Pain in right hip: Secondary | ICD-10-CM | POA: Diagnosis not present

## 2024-08-04 DIAGNOSIS — M67911 Unspecified disorder of synovium and tendon, right shoulder: Secondary | ICD-10-CM | POA: Diagnosis not present

## 2024-08-04 DIAGNOSIS — M5441 Lumbago with sciatica, right side: Secondary | ICD-10-CM | POA: Diagnosis not present

## 2024-08-04 NOTE — Telephone Encounter (Signed)
 Attempted to reach patient. No answer.

## 2024-08-04 NOTE — Telephone Encounter (Addendum)
 Patient called in thinking he had appointment today. I let him know that his appointment was 9/22 and that he had missed it. Patient said he needed to set up an appointment as he has been being seen weekly here. Patient was referred out to wound clinic to take over treatment of the wound. Patient is stating that this is not what they will be evaluating that they will be looking at a cyst on his foot. Patient kept stating I was wrong and that he wanted to make an appointment to see Dr.Wagoner. I gave him Dr. Jameson next available date at the end of October but he told me that was too far away that he needed his unna boot changed. Can patient please be contacted to clarify the referral and if he really does need an appointment for another issue?

## 2024-08-04 NOTE — Telephone Encounter (Signed)
 Inbound call from patient stating he's been having a burning discomfort in his rectal area and he's been having trouble sleeping patient would like to be seen in office but can not wait for our next availability in November. Patient would like to know if nurse has a sooner appt for him to be seen or If he can be advised on what to do. Please advise  Thank you

## 2024-08-05 ENCOUNTER — Encounter (HOSPITAL_BASED_OUTPATIENT_CLINIC_OR_DEPARTMENT_OTHER): Attending: General Surgery | Admitting: General Surgery

## 2024-08-05 ENCOUNTER — Ambulatory Visit (HOSPITAL_BASED_OUTPATIENT_CLINIC_OR_DEPARTMENT_OTHER): Admitting: General Surgery

## 2024-08-05 DIAGNOSIS — L97529 Non-pressure chronic ulcer of other part of left foot with unspecified severity: Secondary | ICD-10-CM | POA: Diagnosis not present

## 2024-08-05 DIAGNOSIS — M14672 Charcot's joint, left ankle and foot: Secondary | ICD-10-CM | POA: Diagnosis not present

## 2024-08-05 DIAGNOSIS — R7303 Prediabetes: Secondary | ICD-10-CM | POA: Diagnosis not present

## 2024-08-05 DIAGNOSIS — L729 Follicular cyst of the skin and subcutaneous tissue, unspecified: Secondary | ICD-10-CM | POA: Diagnosis not present

## 2024-08-05 DIAGNOSIS — K5900 Constipation, unspecified: Secondary | ICD-10-CM | POA: Diagnosis not present

## 2024-08-05 DIAGNOSIS — L97422 Non-pressure chronic ulcer of left heel and midfoot with fat layer exposed: Secondary | ICD-10-CM | POA: Diagnosis not present

## 2024-08-05 NOTE — Telephone Encounter (Signed)
 Second attempt trying to reach patient.  Reached voicemail w/ no identifiers. LVM to return call so we can obtain more information.

## 2024-08-08 NOTE — Telephone Encounter (Signed)
 Left additional message for patient to call back. Looks like he was already rescheduled to see PA on 08/23/24. Will also send mychart note since he does check this.

## 2024-08-10 ENCOUNTER — Ambulatory Visit: Admitting: Podiatry

## 2024-08-12 ENCOUNTER — Encounter (HOSPITAL_BASED_OUTPATIENT_CLINIC_OR_DEPARTMENT_OTHER): Payer: Self-pay | Admitting: Internal Medicine

## 2024-08-16 ENCOUNTER — Encounter (HOSPITAL_BASED_OUTPATIENT_CLINIC_OR_DEPARTMENT_OTHER): Attending: Internal Medicine | Admitting: General Surgery

## 2024-08-16 DIAGNOSIS — M14672 Charcot's joint, left ankle and foot: Secondary | ICD-10-CM | POA: Insufficient documentation

## 2024-08-16 DIAGNOSIS — L97529 Non-pressure chronic ulcer of other part of left foot with unspecified severity: Secondary | ICD-10-CM | POA: Insufficient documentation

## 2024-08-18 ENCOUNTER — Ambulatory Visit (HOSPITAL_BASED_OUTPATIENT_CLINIC_OR_DEPARTMENT_OTHER): Admitting: General Surgery

## 2024-08-23 ENCOUNTER — Ambulatory Visit: Admitting: Gastroenterology

## 2024-08-23 NOTE — Telephone Encounter (Signed)
 Pt was scheduled to see Harlene today and had car trouble and could not get to the office. He is c/o constipation. States his last BM was a week ago but he had to take something to make him go. He complains of being bloated and also having difficulty urinating.Discussed with pt the urination issues should be discussed with PCP or urology. Pt states he was told by urology that if he was very constipated there could be pushing on the prostate that would cause constipation. Pt very concerned as to what is causing his constipation and would like suggestions for him until he can be seen. Discussed with pt that he could try a miralax  purge. Called pt back to try and get him an appt, left message for him to call back.

## 2024-08-23 NOTE — Telephone Encounter (Signed)
 Patient requesting f/u call in regards to having constipation. Please advise.

## 2024-08-23 NOTE — Progress Notes (Unsigned)
 Ellouise Console, PA-C [address] [place], [place]  [address] Phone: [phone]   Primary Care Physician: Aisha Harvey, MD  Primary Gastroenterologist:  Ellouise Console, PA-C / Elspeth Naval, MD   Chief Complaint: Follow-up constipation, GERD, and Barrett's esophagus      HPI:   [name] is a 83 y.o. male returns for annual follow-up of constipation, GERD, and Barrett's.  Last saw Amy Esterwood PA-C 10/2023.  Discussed the use of AI scribe software for clinical note transcription with the patient, who gave verbal consent to proceed.  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  Barrett's esophagus was diagnosed in 2021 following endoscopy and colonoscopy. He takes pantoprazole  40 mg once or twice daily. Acid reflux is generally controlled with one dose in the morning, but exacerbations occur in the evening, especially after church dinners, possibly due to seasoning. He sometimes feels unwell after these meals.  Constipation is significant, with bowel movements every five to seven days. Stools begin hard and large, then become softer. He uses Equate Miralax  in the morning and a stool softener in the evening. Despite this, he experiences pain and discomfort, describing it as similar to childbirth. He has tried adjusting doses but is unsure of the optimal amount.  He experiences gut pain, particularly in the bilateral lower abdomen, described as sharp with occasional cramping, and is relieved after a BM. There is a swollen and tender anus, suspected to be a hemorrhoid. He has false urges to defecate, especially when lying down, and sometimes feels a burning sensation in the rectum described as a 'blowtorch inside'. Mucus discharge and occasional gurgling sounds are noted.     08/2020 EGD by Dr. Eda: 2 tongues of salmon-colored mucosa in the distal esophagus 1  cm in length.  Minimal gastritis.  Multiple small gastric polyps.  Normal duodenum.  Biopsies positive for Barrett's.  Negative for celiac and H. pylori.  No dysplasia.  3-year repeat (due 08/2023), pending health status.  08/2020 colonoscopy by Dr. Eda: 6 small (1 to 2 mm) polyps removed.  Biopsy showed tubular adenoma, sessile serrated polyp, and hyperplastic polyps.  Diverticulosis.  Good prep.  Moderately difficult procedure due to significant looping.  3-year repeat (due 08/2023), pending health status.  2018 colonoscopy by Dr. Rollin: Tubular adenoma and sessile serrated adenoma removed.  PMH: CKD followed by nephrology, GERD, Barrett's, history of adenomatous colon polyps, obesity, aspiration pneumonia, asthma, sleep apnea (uses CPAP), osteoarthritis, hypertension, chronic pain, anxiety, prediabetes.  Current Outpatient Medications  Medication Sig Dispense Refill   ASTEPRO  205.5 MCG/SPRAY SOLN Place 2 sprays into both nostrils as needed.     buPROPion  (WELLBUTRIN  XL) 150 MG 24 hr tablet Take 450 mg by mouth every morning.     docusate sodium  (COLACE) 100 MG capsule Take 100 mg by mouth as needed for mild constipation.     fluticasone  (FLONASE  ALLERGY RELIEF) 50 MCG/ACT nasal spray Place 2 sprays into both nostrils as needed.     furosemide  (LASIX ) 40 MG tablet TAKE 1 TABLET BY MOUTH EVERY MORNING AND AN ADDITIONAL TABLET DAILY AS NEEDED FOR UNRESOLVED SWELLING OR FLUID RETENTION 135 tablet 2   gabapentin  (NEURONTIN ) 300 MG capsule TAKE 1 TO 2 CAPSULES BY MOUTH AT BEDTIME 60 capsule 0   HYDROcodone -acetaminophen  (NORCO/VICODIN) 5-325 MG tablet Take 1-2 tablets by mouth every 6 (six) hours as needed. 10 tablet 0  meclizine  (ANTIVERT ) 25 MG tablet Take 1 tablet (25 mg total) by mouth 3 (three) times daily as needed for dizziness. 30 tablet 0   methocarbamol  (ROBAXIN ) 500 MG tablet Take 1 tablet (500 mg total) by mouth every 8 (eight) hours as needed for muscle spasms. (Patient taking  differently: Take 500 mg by mouth in the morning and at bedtime.) 20 tablet 0   mirtazapine  (REMERON ) 15 MG tablet Take 15 mg by mouth at bedtime.     montelukast  (SINGULAIR ) 10 MG tablet Take 1 tablet (10 mg total) by mouth at bedtime. 30 tablet 5   Multiple Vitamin (MULTIVITAMIN WITH MINERALS) TABS tablet Take 1 tablet by mouth every morning. 90 tablet 0   polycarbophil (FIBERCON) 625 MG tablet Take 1 tablet (625 mg total) by mouth daily. 30 tablet 0   polyethylene glycol (MIRALAX  / GLYCOLAX ) 17 g packet Take 17 g by mouth 2 (two) times daily. 14 each 0   potassium chloride  SA (KLOR-CON  M) 20 MEQ tablet Take 2 tablets (40 mEq total) by mouth 2 (two) times daily. 120 tablet 3   rosuvastatin  (CRESTOR ) 5 MG tablet Take 5 mg by mouth daily.     silver  sulfADIAZINE  (SILVADENE ) 1 % cream Apply 1 Application topically daily. 50 g 0   tamsulosin  (FLOMAX ) 0.4 MG CAPS capsule Take 0.4 mg by mouth 2 (two) times daily.     pantoprazole  (PROTONIX ) 40 MG tablet Take 1 tablet (40 mg total) by mouth 2 (two) times daily before a meal. (Patient not taking: Reported on 08/24/2024) 60 tablet 3   No current facility-administered medications for this visit.    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   L4, L5 ,S1   Depression    takes Wellbutrin  daily   Dyspnea on exertion    with exertion   GERD (gastroesophageal reflux disease)    takes Nexium  and Omeprazole daily   History of kidney stones    HTN (hypertension)    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   Hyperlipidemia    Joint pain    Neuromuscular disorder (HCC)    Pre-diabetes    Prediabetes    Sleep  apnea    uses CPAP nightly   SOB (shortness of breath)    Swallowing difficulty    Swelling of lower extremity    right more than leg leg   Umbilical hernia     Past Surgical History:  Procedure Laterality Date   ABDOMINAL AORTOGRAM W/LOWER EXTREMITY N/A 03/14/2020   Procedure: ABDOMINAL AORTOGRAM W/LOWER EXTREMITY;  Surgeon: Serene Gaile ORN, MD;  Location: [facility];  Service: Vascular;  Laterality: N/A;   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   AMPUTATION TOE Right 04/18/2022   Procedure: AMPUTATION RIGHT GREAT TOE;  Surgeon: Yvone Rush, MD;  Location: [facility];  Service: Orthopedics;  Laterality: Right;   BACK SURGERY  2003   L4, L5   BACK SURGERY     CATARACT EXTRACTION, BILATERAL  CYSTOSCOPY/URETEROSCOPY/HOLMIUM LASER/STENT PLACEMENT Left 12/29/2023   Procedure: CYSTOSCOPY/ LEFT URETEROSCOPY/HOLMIUM LASER/STENT PLACEMENT;  Surgeon: Watt Rush, MD;  Location: [facility];  Service: Urology;  Laterality: Left;  60 MINUTE CASE   ENDOVENOUS ABLATION SAPHENOUS VEIN W/ LASER Right 06-01-2014   EVLA right greater saphenous vein by Krystal Doing MD    epidural steroid injections        [facility]   hernia repair  2003   HERNIA REPAIR     hernia inguinal x 2   TOTAL HIP ARTHROPLASTY Left 05/05/2022   Procedure: LEFT TOTAL HIP ARTHROPLASTY ANTERIOR APPROACH;  Surgeon: Yvone Rush, MD;  Location: [facility];  Service: Orthopedics;  Laterality: Left;   TOTAL KNEE ARTHROPLASTY Left 03/22/2015   Procedure: TOTAL KNEE ARTHROPLASTY;  Surgeon: Glendia Cordella Hutchinson, MD;  [facility];  Service: Orthopedics;  Laterality: Left;   UMBILICAL HERNIA REPAIR N/A 06/27/2016   Procedure: LAPAROSCOPIC ASSISTED REPAIR OF UMBILICAL HERINA WITH MESH;  Surgeon: Donnice Lunger, MD;  Location: [facility];  Service: General;  Laterality: N/A;   VASCULAR SURGERY  2015   right leg    Review of Systems:    All systems reviewed and negative except where noted in HPI.    Physical  Exam:  BP 112/64 (BP Location: Left Arm, Patient Position: Sitting, Cuff Size: Large)   Pulse 88   Ht 5' 7 (1.702 m)   Wt 267 lb 8 oz (121.3 kg)   BMI 41.90 kg/m  No LMP for male patient.  General: Elderly, obese, chronically ill-appearing male, in no acute distress.  Sitting in a wheelchair.  He also has a cane. Lungs: Clear to auscultation bilaterally. Non-labored. Heart: Regular rate and rhythm, no murmurs rubs or gallops.  Abdomen: Bowel sounds are normal; Abdomen is Soft and very obese; No hepatosplenomegaly, masses or hernias;  No Abdominal Tenderness; No guarding or rebound tenderness. Rectal (performed with patient standing): Mild loose fecal incontinence of stool at the rectum.  There is a small internal hemorrhoid protruding to the right at the anus.  No significant external hemorrhoids.  No rectal masses.  Weak anal sphincter tone.  Stool is brown and Hemoccult negative. Extremities: 2+ bilateral lower extremity edema.  Compression wraps are in place. Neuro: Alert and oriented x 3.  Grossly intact.  Psych: Alert and cooperative, normal mood and affect.  Chaperone for Exam:  Alethea Blocker, CMA    Imaging Studies: No results found.  Labs: CBC    Component Value Date/Time   WBC 5.0 06/05/2024 0340   RBC 3.87 (L) 06/05/2024 0340   HGB 13.1 06/05/2024 0340   HCT 37.5 (L) 06/05/2024 0340   PLT 139 (L) 06/05/2024 0340   MCV 96.9 06/05/2024 0340   MCV 90.9 05/19/2016 1235   MCH 33.9 06/05/2024 0340   MCHC 34.9 06/05/2024 0340   RDW 12.8 06/05/2024 0340   LYMPHSABS 1.3 06/05/2024 0340   MONOABS 0.5 06/05/2024 0340   EOSABS 0.3 06/05/2024 0340   BASOSABS 0.0 06/05/2024 0340    CMP     Component Value Date/Time   NA 136 06/05/2024 0340   K 3.6 06/05/2024 0340   CL 99 06/05/2024 0340   CO2 25 06/05/2024 0340   GLUCOSE 115 (H) 06/05/2024 0340   BUN 16 06/05/2024 0340   CREATININE 1.50 (H) 06/05/2024 0340   CREATININE 1.72 (H) 04/05/2021 1527   CALCIUM  9.1  06/05/2024 0340   PROT 7.5 06/01/2024 1535   ALBUMIN  4.1 06/01/2024 1535   AST 54 (  H) 06/01/2024 1535   ALT 51 (H) 06/01/2024 1535   ALKPHOS 115 06/01/2024 1535   BILITOT 1.2 06/01/2024 1535   GFRNONAA 46 (L) 06/05/2024 0340   GFRNONAA 45 (L) 05/19/2016 1215   GFRAA 48 (L) 06/24/2016 1500   GFRAA 51 (L) 05/19/2016 1215       Assessment and Plan:   [name] is a 83 y.o. y/o male returns for follow-up of  1.  Chronic GERD - Continue pantoprazole  40 mg once daily every day and okay to take a second dose if needed for breakthrough GERD symptoms in the evening. - He can also take OTC antacid for breakthrough GERD. - Recommend Lifestyle Modifications to prevent Acid Reflux.  Rec. Avoid coffee, sodas, peppermint, garlic, onions, alcohol, citrus fruits, chocolate, tomatoes, fatty and spicey foods.  Avoid eating 2-3 hours before bedtime.    2.  Short segment Barrett's - I offered to schedule a 3-year repeat surveillance EGD for patient in hospital, however patient adamantly declined.  He does not wish to pursue any further procedures given his advanced age and poor health status.  3.  Chronic constipation - I advised patient to increase MiraLAX  to 2 capfuls once daily nightly in the morning. - I advised patient to increase Senokot S8.6/50 mg to 2 to 4 tablets at bedtime. - He is advised to adjust doses of MiraLAX  and Senokot based on bowel habits. - I recommend he take Metamucil every day. -I discussed prescription Linzess, Trulance, or Amitiza, however these are cost prohibitive. - - Recommend High Fiber diet with fruits, vegetables, and whole grains. - Drink 64 ounces of Fluids Daily.   4.  History of adenomatous colon polyps - I offered to schedule 3-year repeat surveillance colonoscopy in hospital, however patient adamantly declined.  He does not wish to pursue any further procedures given his advanced age and poor health status.  5.  Comorbidities: CKD followed by  nephrology, GERD, Barrett's, history of adenomatous colon polyps, obesity, aspiration pneumonia, asthma, sleep apnea (uses CPAP), osteoarthritis, hypertension, chronic pain, anxiety, prediabetes   Ellouise Console, PA-C  Follow up as needed.

## 2024-08-23 NOTE — Telephone Encounter (Signed)
 Spoke with [name] and scheduled him to see Ellouise Console PA 08/24/24 at 10:40am. [name] aware of appt.

## 2024-08-24 ENCOUNTER — Encounter: Payer: Self-pay | Admitting: Physician Assistant

## 2024-08-24 ENCOUNTER — Encounter (HOSPITAL_BASED_OUTPATIENT_CLINIC_OR_DEPARTMENT_OTHER): Admitting: General Surgery

## 2024-08-24 ENCOUNTER — Ambulatory Visit: Admitting: Physician Assistant

## 2024-08-24 VITALS — BP 112/64 | HR 88 | Ht 67.0 in | Wt 267.5 lb

## 2024-08-24 DIAGNOSIS — M14672 Charcot's joint, left ankle and foot: Secondary | ICD-10-CM | POA: Diagnosis not present

## 2024-08-24 DIAGNOSIS — G629 Polyneuropathy, unspecified: Secondary | ICD-10-CM | POA: Diagnosis not present

## 2024-08-24 DIAGNOSIS — K219 Gastro-esophageal reflux disease without esophagitis: Secondary | ICD-10-CM | POA: Diagnosis not present

## 2024-08-24 DIAGNOSIS — R109 Unspecified abdominal pain: Secondary | ICD-10-CM

## 2024-08-24 DIAGNOSIS — K5904 Chronic idiopathic constipation: Secondary | ICD-10-CM

## 2024-08-24 DIAGNOSIS — Z860102 Personal history of hyperplastic colon polyps: Secondary | ICD-10-CM | POA: Diagnosis not present

## 2024-08-24 DIAGNOSIS — L97422 Non-pressure chronic ulcer of left heel and midfoot with fat layer exposed: Secondary | ICD-10-CM | POA: Diagnosis not present

## 2024-08-24 DIAGNOSIS — Z860101 Personal history of adenomatous and serrated colon polyps: Secondary | ICD-10-CM | POA: Diagnosis not present

## 2024-08-24 DIAGNOSIS — K5909 Other constipation: Secondary | ICD-10-CM | POA: Diagnosis not present

## 2024-08-24 DIAGNOSIS — L97529 Non-pressure chronic ulcer of other part of left foot with unspecified severity: Secondary | ICD-10-CM | POA: Diagnosis not present

## 2024-08-24 DIAGNOSIS — K2270 Barrett's esophagus without dysplasia: Secondary | ICD-10-CM | POA: Diagnosis not present

## 2024-08-24 NOTE — Patient Instructions (Addendum)
 Please continue pantoprazole  40 Mg 1 tablet once or twice daily for acid reflux.  This medication will also help your Barrett's esophagus and will prevent esophageal cancer.  Take the pantoprazole  every day. Recommend Lifestyle Modifications to prevent Acid Reflux.  Rec. Avoid coffee, sodas, peppermint, garlic, onions, alcohol, citrus fruits, chocolate, tomatoes, fatty and spicey foods.  Avoid eating 2-3 hours before bedtime.  RAD  For constipation: -Increase MiraLAX  to 2 capfuls once daily in a drink. - Increase stool softener to 2-or 3 tablets once daily before bedtime. -Go ahead and take fiber supplement every day. -Drink 64 ounces of water  or fluids daily. -Eat 30 g of dietary fiber daily including fruits, vegetables, whole grains.  For internal hemorrhoids: - Treat underlying constipation. - Avoid hard stools, straining, or sitting on the commode for a long time. -Let us  know if you would like to try prescription hydrocortisone suppository in the future. -Let us  know if you are interested in scheduling internal hemorrhoid banding procedure with one of our physicians.  Please follow up sooner if symptoms increase or worsen  Due to recent changes in healthcare laws, you may see the results of your imaging and laboratory studies on MyChart before your provider has had a chance to review them.  We understand that in some cases there may be results that are confusing or concerning to you. Not all laboratory results come back in the same time frame and the provider may be waiting for multiple results in order to interpret others.  Please give us  48 hours in order for your provider to thoroughly review all the results before contacting the office for clarification of your results.   Thank you for trusting me with your gastrointestinal care!   Ellouise Console, PA-C _______________________________________________________  If your blood pressure at your visit was 140/90 or greater, please contact  your primary care physician to follow up on this.  _______________________________________________________  If you are age 83 or older, your body mass index should be between 23-30. Your Body mass index is 41.9 kg/m. If this is out of the aforementioned range listed, please consider follow up with your Primary Care Provider.  If you are age 54 or younger, your body mass index should be between 19-25. Your Body mass index is 41.9 kg/m. If this is out of the aformentioned range listed, please consider follow up with your Primary Care Provider.   ________________________________________________________  The [facility] providers would like to encourage you to use MYCHART to communicate with providers for non-urgent requests or questions.  Due to long hold times on the telephone, sending your provider a message by [facility] may be a faster and more efficient way to get a response.  Please allow 48 business hours for a response.  Please remember that this is for non-urgent requests.  _______________________________________________________

## 2024-08-25 NOTE — Progress Notes (Signed)
 Agree with assessment plan as outlined. Patient has a pretty short segment of Barrett's, understandable with his other medical problems if he does not want further surveillance.  Further, for short segment he would not need another exam for 5 years from his last exam.  At his age I think reasonable to hold off on further surveillance exams.  He should continue to see us  at least yearly in the office for this issue.  Thanks

## 2024-09-01 ENCOUNTER — Encounter (HOSPITAL_BASED_OUTPATIENT_CLINIC_OR_DEPARTMENT_OTHER): Admitting: General Surgery

## 2024-09-02 ENCOUNTER — Encounter (HOSPITAL_BASED_OUTPATIENT_CLINIC_OR_DEPARTMENT_OTHER): Admitting: General Surgery

## 2024-09-05 DIAGNOSIS — M5416 Radiculopathy, lumbar region: Secondary | ICD-10-CM | POA: Diagnosis not present

## 2024-09-12 ENCOUNTER — Encounter (HOSPITAL_BASED_OUTPATIENT_CLINIC_OR_DEPARTMENT_OTHER): Admitting: General Surgery

## 2024-09-12 ENCOUNTER — Encounter: Payer: Self-pay | Admitting: Radiology

## 2024-09-12 DIAGNOSIS — Z23 Encounter for immunization: Secondary | ICD-10-CM | POA: Diagnosis not present

## 2024-09-12 DIAGNOSIS — R7303 Prediabetes: Secondary | ICD-10-CM | POA: Diagnosis not present

## 2024-09-12 DIAGNOSIS — L97529 Non-pressure chronic ulcer of other part of left foot with unspecified severity: Secondary | ICD-10-CM | POA: Diagnosis not present

## 2024-09-12 DIAGNOSIS — J301 Allergic rhinitis due to pollen: Secondary | ICD-10-CM | POA: Diagnosis not present

## 2024-09-12 DIAGNOSIS — E782 Mixed hyperlipidemia: Secondary | ICD-10-CM | POA: Diagnosis not present

## 2024-09-12 DIAGNOSIS — J4541 Moderate persistent asthma with (acute) exacerbation: Secondary | ICD-10-CM | POA: Diagnosis not present

## 2024-09-12 DIAGNOSIS — N1832 Chronic kidney disease, stage 3b: Secondary | ICD-10-CM | POA: Diagnosis not present

## 2024-09-12 DIAGNOSIS — F331 Major depressive disorder, recurrent, moderate: Secondary | ICD-10-CM | POA: Diagnosis not present

## 2024-09-12 DIAGNOSIS — Z6841 Body Mass Index (BMI) 40.0 and over, adult: Secondary | ICD-10-CM | POA: Diagnosis not present

## 2024-09-12 DIAGNOSIS — R600 Localized edema: Secondary | ICD-10-CM | POA: Diagnosis not present

## 2024-09-12 DIAGNOSIS — K5909 Other constipation: Secondary | ICD-10-CM | POA: Diagnosis not present

## 2024-09-16 ENCOUNTER — Encounter (HOSPITAL_BASED_OUTPATIENT_CLINIC_OR_DEPARTMENT_OTHER): Admitting: General Surgery

## 2024-09-20 ENCOUNTER — Emergency Department (HOSPITAL_COMMUNITY)

## 2024-09-20 ENCOUNTER — Encounter (HOSPITAL_COMMUNITY): Payer: Self-pay | Admitting: *Deleted

## 2024-09-20 ENCOUNTER — Encounter (HOSPITAL_BASED_OUTPATIENT_CLINIC_OR_DEPARTMENT_OTHER): Admitting: General Surgery

## 2024-09-20 ENCOUNTER — Emergency Department (HOSPITAL_COMMUNITY): Admission: EM | Admit: 2024-09-20 | Discharge: 2024-09-21 | Disposition: A | Discharge status: Home / Self Care

## 2024-09-20 DIAGNOSIS — J45909 Unspecified asthma, uncomplicated: Secondary | ICD-10-CM | POA: Insufficient documentation

## 2024-09-20 DIAGNOSIS — Z8659 Personal history of other mental and behavioral disorders: Secondary | ICD-10-CM | POA: Diagnosis not present

## 2024-09-20 DIAGNOSIS — K760 Fatty (change of) liver, not elsewhere classified: Secondary | ICD-10-CM | POA: Diagnosis not present

## 2024-09-20 DIAGNOSIS — L03115 Cellulitis of right lower limb: Secondary | ICD-10-CM | POA: Diagnosis not present

## 2024-09-20 DIAGNOSIS — R600 Localized edema: Secondary | ICD-10-CM | POA: Insufficient documentation

## 2024-09-20 DIAGNOSIS — N183 Chronic kidney disease, stage 3 unspecified: Secondary | ICD-10-CM | POA: Insufficient documentation

## 2024-09-20 DIAGNOSIS — L03119 Cellulitis of unspecified part of limb: Secondary | ICD-10-CM

## 2024-09-20 DIAGNOSIS — K449 Diaphragmatic hernia without obstruction or gangrene: Secondary | ICD-10-CM | POA: Diagnosis not present

## 2024-09-20 DIAGNOSIS — I129 Hypertensive chronic kidney disease with stage 1 through stage 4 chronic kidney disease, or unspecified chronic kidney disease: Secondary | ICD-10-CM | POA: Insufficient documentation

## 2024-09-20 DIAGNOSIS — N200 Calculus of kidney: Secondary | ICD-10-CM | POA: Diagnosis not present

## 2024-09-20 DIAGNOSIS — L03116 Cellulitis of left lower limb: Secondary | ICD-10-CM | POA: Diagnosis not present

## 2024-09-20 DIAGNOSIS — M7989 Other specified soft tissue disorders: Secondary | ICD-10-CM | POA: Diagnosis not present

## 2024-09-20 DIAGNOSIS — Z89419 Acquired absence of unspecified great toe: Secondary | ICD-10-CM | POA: Diagnosis not present

## 2024-09-20 DIAGNOSIS — I10 Essential (primary) hypertension: Secondary | ICD-10-CM | POA: Diagnosis not present

## 2024-09-20 DIAGNOSIS — M85879 Other specified disorders of bone density and structure, unspecified ankle and foot: Secondary | ICD-10-CM | POA: Diagnosis not present

## 2024-09-20 DIAGNOSIS — K6289 Other specified diseases of anus and rectum: Secondary | ICD-10-CM | POA: Insufficient documentation

## 2024-09-20 DIAGNOSIS — Z1330 Encounter for screening examination for mental health and behavioral disorders, unspecified: Secondary | ICD-10-CM | POA: Diagnosis not present

## 2024-09-20 LAB — BASIC METABOLIC PANEL WITH GFR
Anion gap: 12 (ref 5–15)
BUN: 16 mg/dL (ref 8–23)
CO2: 26 mmol/L (ref 22–32)
Calcium: 9.8 mg/dL (ref 8.9–10.3)
Chloride: 103 mmol/L (ref 98–111)
Creatinine, Ser: 1.74 mg/dL — ABNORMAL HIGH (ref 0.61–1.24)
GFR, Estimated: 38 mL/min — ABNORMAL LOW (ref >60)
Glucose, Bld: 114 mg/dL — ABNORMAL HIGH (ref 70–99)
Potassium: 3.5 mmol/L (ref 3.5–5.1)
Sodium: 141 mmol/L (ref 135–145)

## 2024-09-20 LAB — CBC WITH DIFFERENTIAL/PLATELET
Abs Immature Granulocytes: 0.02 K/uL (ref 0.00–0.07)
Basophils Absolute: 0 K/uL (ref 0.0–0.1)
Basophils Relative: 0 %
Eosinophils Absolute: 0.2 K/uL (ref 0.0–0.5)
Eosinophils Relative: 3 %
HCT: 35.4 % — ABNORMAL LOW (ref 39.0–52.0)
Hemoglobin: 12.4 g/dL — ABNORMAL LOW (ref 13.0–17.0)
Immature Granulocytes: 0 %
Lymphocytes Relative: 21 %
Lymphs Abs: 1.1 K/uL (ref 0.7–4.0)
MCH: 33.8 pg (ref 26.0–34.0)
MCHC: 35 g/dL (ref 30.0–36.0)
MCV: 96.5 fL (ref 80.0–100.0)
Monocytes Absolute: 0.5 K/uL (ref 0.1–1.0)
Monocytes Relative: 9 %
Neutro Abs: 3.6 K/uL (ref 1.7–7.7)
Neutrophils Relative %: 67 %
Platelets: 183 K/uL (ref 150–400)
RBC: 3.67 MIL/uL — ABNORMAL LOW (ref 4.22–5.81)
RDW: 11.9 % (ref 11.5–15.5)
WBC: 5.4 K/uL (ref 4.0–10.5)
nRBC: 0 % (ref 0.0–0.2)

## 2024-09-20 LAB — PRO BRAIN NATRIURETIC PEPTIDE: Pro Brain Natriuretic Peptide: 231 pg/mL (ref <300.0)

## 2024-09-20 LAB — RESP PANEL BY RT-PCR (RSV, FLU A&B, COVID)  RVPGX2
Influenza A by PCR: NEGATIVE
Influenza B by PCR: NEGATIVE
Resp Syncytial Virus by PCR: NEGATIVE
SARS Coronavirus 2 by RT PCR: NEGATIVE

## 2024-09-20 MED ORDER — PANTOPRAZOLE SODIUM 40 MG PO TBEC: 40.0000 mg | DELAYED_RELEASE_TABLET | Freq: Two times a day (BID) | ORAL | Status: DC

## 2024-09-20 MED ORDER — CEPHALEXIN 500 MG PO CAPS: 500.0000 mg | ORAL_CAPSULE | Freq: Four times a day (QID) | ORAL | 0 refills | Status: DC

## 2024-09-20 MED ORDER — IOHEXOL 300 MG/ML  SOLN
80.0000 mL | Freq: Once | INTRAMUSCULAR | Status: AC | PRN
  Administered 2024-09-20: 80 mL via INTRAVENOUS

## 2024-09-20 MED ORDER — GABAPENTIN 300 MG PO CAPS
300.0000 mg | ORAL_CAPSULE | Freq: Three times a day (TID) | ORAL | Status: DC
  Administered 2024-09-20: 300 mg via ORAL
  Filled 2024-09-20: qty 1

## 2024-09-20 MED ORDER — DEXTROSE 5 % IV SOLN
1500.0000 mg | Freq: Once | INTRAVENOUS | Status: AC
  Administered 2024-09-20: 1500 mg via INTRAVENOUS
  Filled 2024-09-20: qty 75

## 2024-09-20 MED ORDER — CEPHALEXIN 500 MG PO CAPS
500.0000 mg | ORAL_CAPSULE | Freq: Two times a day (BID) | ORAL | Status: DC
  Administered 2024-09-20: 500 mg via ORAL
  Filled 2024-09-20: qty 1

## 2024-09-20 MED ORDER — VANCOMYCIN HCL 2000 MG/400ML IV SOLN
2000.0000 mg | Freq: Once | INTRAVENOUS | Status: DC
  Filled 2024-09-20: qty 400

## 2024-09-20 MED ORDER — CEPHALEXIN 500 MG PO CAPS: 500.0000 mg | ORAL_CAPSULE | Freq: Two times a day (BID) | ORAL | 0 refills | Status: AC

## 2024-09-20 MED ORDER — FLUTICASONE PROPIONATE 50 MCG/ACT NA SUSP: 2.0000 | NASAL | Status: DC | PRN

## 2024-09-20 MED ORDER — POTASSIUM CHLORIDE CRYS ER 20 MEQ PO TBCR
40.0000 meq | EXTENDED_RELEASE_TABLET | Freq: Two times a day (BID) | ORAL | Status: DC
  Administered 2024-09-20: 40 meq via ORAL
  Filled 2024-09-20: qty 2

## 2024-09-20 MED ORDER — ROSUVASTATIN CALCIUM 5 MG PO TABS
5.0000 mg | ORAL_TABLET | Freq: Every day | ORAL | Status: DC
  Filled 2024-09-20: qty 1

## 2024-09-20 MED ORDER — HYDROCORTISONE ACETATE 25 MG RE SUPP
25.0000 mg | Freq: Once | RECTAL | Status: AC
  Administered 2024-09-20: 25 mg via RECTAL
  Filled 2024-09-20: qty 1

## 2024-09-20 MED ORDER — FUROSEMIDE 40 MG PO TABS: 40.0000 mg | ORAL_TABLET | Freq: Every day | ORAL | Status: DC

## 2024-09-20 NOTE — ED Provider Notes (Signed)
 [place] EMERGENCY DEPARTMENT AT [facility] Provider Note   CSN: [id number] Arrival date & time: 09/20/24  1439     Patient presents with: Rectal Pain and Leg Swelling   [name] is a 83 y.o. male.   83 year old male with past medical history of bilateral chronic foot wounds as well as CKD, hypertension, and hyperlipidemia presenting to the emergency department today with lower extremity pain as well as rectal pain and inability to have a bowel movement now over the past week or so.  The patient states that he has been seen for some chronic lower extremity wounds and swelling.  Reports that these seem to be getting worse despite having his legs wrapped weekly.  He has been taking Lasix  and has been going up on this.  He reports that the redness on his feet has continued to get worse.  He does have some open wounds on his feet now.  He denies any significant shortness of breath with this.  He states that he is having some abdominal distention as well as some rectal pain.  He states that he has the urge to have a bowel movement but has not been able to now for the past 5 days or so.  He denies any associated nausea or vomiting.  He states that there is burning in his rectum when he does try to have a bowel movement.  He came to the ER today for further evaluation for this.        Prior to Admission medications   Medication Sig Start Date End Date Taking? Authorizing Provider  ASTEPRO  205.5 MCG/SPRAY SOLN Place 2 sprays into both nostrils as needed.    [provider]  buPROPion  (WELLBUTRIN  XL) 150 MG 24 hr tablet Take 450 mg by mouth every morning.    [provider]  docusate sodium  (COLACE) 100 MG capsule Take 100 mg by mouth as needed for mild constipation.    [provider]  fluticasone  (FLONASE  ALLERGY RELIEF) 50 MCG/ACT nasal spray Place 2 sprays into both nostrils as needed.    [provider]  furosemide  (LASIX ) 40 MG tablet  TAKE 1 TABLET BY MOUTH EVERY MORNING AND AN ADDITIONAL TABLET DAILY AS NEEDED FOR UNRESOLVED SWELLING OR FLUID RETENTION 12/01/23   Court Dorn PARAS, MD  gabapentin  (NEURONTIN ) 300 MG capsule TAKE 1 TO 2 CAPSULES BY MOUTH AT BEDTIME 01/15/17   Gretta Ozell CROME, PA-C  HYDROcodone -acetaminophen  (NORCO/VICODIN) 5-325 MG tablet Take 1-2 tablets by mouth every 6 (six) hours as needed. 02/04/24   Patsey Lot, MD  meclizine  (ANTIVERT ) 25 MG tablet Take 1 tablet (25 mg total) by mouth 3 (three) times daily as needed for dizziness. 08/27/23   Raford Lenis, MD  methocarbamol  (ROBAXIN ) 500 MG tablet Take 1 tablet (500 mg total) by mouth every 8 (eight) hours as needed for muscle spasms. Patient taking differently: Take 500 mg by mouth in the morning and at bedtime. 08/08/21   Eudelia Maude SAUNDERS, PA-C  mirtazapine  (REMERON ) 15 MG tablet Take 15 mg by mouth at bedtime.    [provider]  montelukast  (SINGULAIR ) 10 MG tablet Take 1 tablet (10 mg total) by mouth at bedtime. 04/10/23   Rai, Nydia POUR, MD  Multiple Vitamin (MULTIVITAMIN WITH MINERALS) TABS tablet Take 1 tablet by mouth every morning. 12/30/23   Watt Rush, MD  pantoprazole  (PROTONIX ) 40 MG tablet Take 1 tablet (40 mg total) by mouth 2 (two) times daily before a meal. Patient not  taking: Reported on 08/24/2024 04/10/23   Rai, Nydia POUR, MD  polycarbophil (FIBERCON) 625 MG tablet Take 1 tablet (625 mg total) by mouth daily. 05/30/24   Haviland, Julie, MD  polyethylene glycol (MIRALAX  / GLYCOLAX ) 17 g packet Take 17 g by mouth 2 (two) times daily. 06/06/24   Lue Elsie BROCKS, MD  potassium chloride  SA (KLOR-CON  M) 20 MEQ tablet Take 2 tablets (40 mEq total) by mouth 2 (two) times daily. 08/27/23   Raford Lenis, MD  rosuvastatin  (CRESTOR ) 5 MG tablet Take 5 mg by mouth daily. 12/20/18   [provider]  silver  sulfADIAZINE  (SILVADENE ) 1 % cream Apply 1 Application topically daily. 07/19/24   Gershon Donnice SAUNDERS, DPM  tamsulosin   (FLOMAX ) 0.4 MG CAPS capsule Take 0.4 mg by mouth 2 (two) times daily.    [provider]    Allergies: Patient has no known allergies.    Review of Systems  Cardiovascular:  Positive for leg swelling.  Gastrointestinal:  Positive for abdominal pain.  Skin:  Positive for wound.  All other systems reviewed and are negative.   Updated Vital Signs Pulse 84   Temp 98.8 F (37.1 C) (Oral)   Resp 17   Wt 123.2 kg   SpO2 96%   BMI 42.52 kg/m   Physical Exam Vitals and nursing note reviewed.   Gen: NAD, chronically ill appearing Eyes: PERRL, EOMI HEENT: no oropharyngeal swelling Neck: trachea midline Resp: clear to auscultation bilaterally Card: RRR, no murmurs, rubs, or gallops Abd: Moderately distended, the patient is tender over the left lower quadrant with deep palpation with no guarding or rebound, rectal exam chaperoned by male nursing staff shows no obvious hemorrhoids, no fissures, no stool in the rectal vault or evidence of fecal impaction Extremities: The patient's wrappings were removed from his legs and he does have some edema above and below the wrappings, there is no calf tenderness, there are multiple superficial wounds with erythema over the bilateral feet noted Vascular: Trace DP pulses bilaterally Skin: no rashes Psyc: acting appropriately   (all labs ordered are listed, but only abnormal results are displayed) Labs Reviewed  BASIC METABOLIC PANEL WITH GFR - Abnormal; Notable for the following components:      Result Value   Glucose, Bld 114 (*)    Creatinine, Ser 1.74 (*)    GFR, Estimated 38 (*)    All other components within normal limits  RESP PANEL BY RT-PCR (RSV, FLU A&B, COVID)  [id number]  PRO BRAIN NATRIURETIC PEPTIDE  CBC WITH DIFFERENTIAL/PLATELET    EKG: EKG Interpretation Date/Time:  Tuesday September 20 2024 14:59:05 EST Ventricular Rate:  85 PR Interval:  77 QRS Duration:  111 QT Interval:  410 QTC Calculation: 488 R  Axis:   64  Text Interpretation: Sinus rhythm Short PR interval Nonspecific T abnormalities, anterior leads Borderline prolonged QT interval Confirmed by Ula Barter [phone]) on 09/20/2024 3:36:49 PM  Radiology: CT ABDOMEN PELVIS W CONTRAST Result Date: 09/20/2024 CLINICAL DATA:  Lower extremity edema abdominal pain EXAM: CT ABDOMEN AND PELVIS WITH CONTRAST TECHNIQUE: Multidetector CT imaging of the abdomen and pelvis was performed using the standard protocol following bolus administration of intravenous contrast. RADIATION DOSE REDUCTION: This exam was performed according to the departmental dose-optimization program which includes automated exposure control, adjustment of the mA and/or kV according to patient size and/or use of iterative reconstruction technique. CONTRAST:  80mL OMNIPAQUE  IOHEXOL  300 MG/ML  SOLN COMPARISON:  CT 05/30/2024 FINDINGS: Lower chest: Lung bases demonstrate dependent atelectasis.  No acute airspace disease. Multi-vessel coronary vascular calcification. Small hiatal hernia Hepatobiliary: Hepatic steatosis. No calcified gallstone or biliary dilatation Pancreas: Unremarkable. No pancreatic ductal dilatation or surrounding inflammatory changes. Spleen: Normal in size without focal abnormality. Adrenals/Urinary Tract: Adrenal glands are normal. Kidneys show no hydronephrosis. Multiple right-sided kidney stones, largest is seen at the lower pole and measures 12 mm. The bladder is partially obscured by artifact Stomach/Bowel: Decompressed stomach. No dilated small bowel. No acute bowel wall thickening. Negative appendix. Moderate stool burden Vascular/Lymphatic: Aortic atherosclerosis. No aneurysm. No suspicious lymph nodes Reproductive: Negative prostate Other: Negative for ascites or free air Musculoskeletal: Left hip replacement with artifact. Scoliosis and multilevel degenerative changes of the spine. Moderate right hip arthritis IMPRESSION: 1. No CT evidence for acute intra-abdominal  or pelvic abnormality. 2. Hepatic steatosis. 3. Nonobstructing right kidney stones. 4. Aortic atherosclerosis. Aortic Atherosclerosis (ICD10-I70.0). Electronically Signed   By: Luke Bun M.D.   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 Osteopenia. No acute fracture. No focal osseous lesion. No joint dislocation. Midfoot degenerative spurring again noted. SOFT TISSUES: No radiopaque foreign body. Severe, diffuse soft tissue swelling of the foot. IMPRESSION: 1. Severe, diffuse soft tissue swelling of the foot. No radiographic findings of osteomyelitis at this time Electronically signed by: Rogelia Myers MD 09/20/2024 04:22 PM EST RP Workstation: [id number]   DG Foot Complete Left Result Date: 09/20/2024 CLINICAL DATA:  Redness. EXAM: LEFT FOOT - COMPLETE 3+ VIEW COMPARISON:  Left foot radiograph dated 06/13/2024. FINDINGS: No acute fracture or dislocation. 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 There is pes planus. There is diffuse subcutaneous edema of the forefoot. Focal skin thickening and ulceration of the plantar midfoot. No radiopaque foreign object or soft tissue gas. IMPRESSION: 1. No acute fracture or dislocation. 2. 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 3. Diffuse subcutaneous edema on focal skin thickening and ulceration of the plantar midfoot. Electronically Signed   By: Vanetta Chou M.D.   On: 09/20/2024 16:19   DG Chest 2 View Result Date: 09/20/2024 CLINICAL DATA:  Bilateral lower extremity edema 2 weeks. EXAM: CHEST - 2 VIEW COMPARISON:  06/01/2024, 08/26/2023 FINDINGS: Lungs are hypoinflated without focal airspace consolidation or effusion. Borderline stable cardiomegaly. Remainder of the exam is unchanged. IMPRESSION: Hypoinflation without  acute cardiopulmonary disease. Electronically Signed   By: Toribio Agreste M.D.   On: 09/20/2024 16:16     Procedures   Medications Ordered in the ED  iohexol  (OMNIPAQUE ) 300 MG/ML solution 80 mL (80 mLs Intravenous Contrast Given 09/20/24 1625)                                    Medical Decision Making 83 year old male with past medical history of CKD, hypertension, and hyperlipidemia as well as venous stasis presenting to the emergency department today with worsening lower extremity swelling and wounds on his lower extremities.  I will further evaluate the patient here with basic labs here to eval for worsening renal dysfunction.  Will obtain chest x-ray to evaluate for pulmonary edema.  Given the patient's abdominal pain and age we will obtain a CT scan to evaluate for obstruction, diverticulitis, or other intra-abdominal pathology.  The patient does not have findings on exam for DVT.  I will reevaluate for ultimate disposition.  The patient's labs are largely reassuring.  CBC is pending at time of signout.  The patient does have diffuse soft tissue swelling of his bilateral feet here but no osseous abnormalities.  He is covered with vancomycin  for cellulitis.  Plan is for admission to the hospitalist service for IV antibiotics.  CT abdomen does not show any concerning findings.  Amount and/or Complexity of Data Reviewed Labs: ordered. Radiology: ordered.  Risk Prescription drug management.        Final diagnoses:  Cellulitis of foot    ED Discharge Orders     None          Ula Prentice SAUNDERS, MD 09/20/24 1701

## 2024-09-20 NOTE — Progress Notes (Signed)
 ED Pharmacy Antibiotic Sign Off An antibiotic consult was received from an ED provider for Vancomycin  per pharmacy dosing for cellulitis. A chart review was completed to assess appropriateness.   The following one time order(s) were placed:  Vancomycin  2g IV  Further antibiotic and/or antibiotic pharmacy consults should be ordered by the admitting provider if indicated.   Thank you for allowing pharmacy to be a part of this patient's care.   Arvin Gauss, PharmD Clinical Pharmacist 09/20/24 5:03 PM

## 2024-09-20 NOTE — Progress Notes (Signed)
 Pt stable for dc. Pt reports not having adequate housing, stating that his loft lacks heat, making it uninhabitable under current weather conditions. CSW offered shelter resources and transportation assistance, which pt declined multiple times stating shelter is not a suitable solution. Pt stated that his PCP advised him to come to the ER to be seen and that pt should not anticipate being dc until the following day. Pt states that he has no friends or family local. CSW educated the pt regarding the purpose of the ED, clarifying that it is intended for medical emergencies rather than shelter placement. CSW staffed the case with EDP and informed that community resources was offered and declined. Per EDP, due to current weather conditions and the patient's age, dc will be deferred until the morning. No further ICM needs identified at this time.

## 2024-09-20 NOTE — ED Triage Notes (Addendum)
 Pt is here from home by ems for bilateral lower extremity edema increasing x2 weeks.  Arrives by [facility] due to his MD wanting him evaluated.  Pt takes furosemide  as prescribed.  No CP or sob. Pt also reports rectal pain which feels like a torch

## 2024-09-20 NOTE — ED Notes (Signed)
 Pic of pt feet placed on chart.  Pt has dressings in place which were placed by the wound center, this was applied 10/31 but have not yet been changed since

## 2024-09-20 NOTE — ED Provider Notes (Signed)
 Provider Suicide Risk Assessment Note  Nursing Documentation C-SSRS RISK CATEGORY: No Risk     Suicide Risk Assessment:   Based on my clinical evaluation, I estimate the patient to be at acute low risk of self-harm in the current setting. This decision is based on my review of the chart including patient's history and current presentation, interview of the patient, mental status examination, and consideration of suicide risk including evaluating suicidal ideation, plan, intent, suicidal or self-harm behaviors, risk factors, and protective factors.  Patient has following modifiable risk factors for suicide: social isolation Patient has following non-modifiable or demographic risk factors for suicide:male gender Patient has the following protective factors against suicide: no history of NSSIB Mitigation: Risk for suicide is being addressed by recommendations for: outpatient consult/treatment (low risk)   Patient denied any SI or HI to me.   Mannie Pac T, DO 09/20/24 2135

## 2024-09-20 NOTE — ED Provider Notes (Addendum)
 Patient assumed at signout.  This is an 83 year old male here today for chronic lower extremity edema, worsened over the last few weeks.  Denies fever or chills.  Patient was signed out pending CBC and reevaluation.  When I examined the patient's feet, he does have some erythema, some mild warmth. Small, clean ulcer on left plantar surface, no visible bone, no pus. Lower suspicion for osteomyelitis. While uncommon, I do believe the patient could be developing some bilateral lower extremity cellulitis.  Do not see any extensive skin breakdown on my exam.  I think the patient would benefit from Dalvance administration, as well as some p.o. Keflex  for broader coverage.  Discussed this with the patient, he expressed some concern about his ability to get back into his home this evening.  He tells me that he lives in a warehouse loft, and he does not have access to it after hours.  I have reached out to [facility] to help solve this issue.  Reassessment 7:30 PM-I had a lengthy conversation with this patient.  I again went over his issue of describing some pain with bowel movements.  Appears to be a hemorrhoid.  I provided him with an Anusol suppository.  I explained to the patient how based on my exam he did not require admission at this time is appropriate for outpatient antibiotic therapy.  He has no leukocytosis, no fever, overall normal blood work.  There is no evidence for necrotizing infection in his legs, I do not see indication for admission for IV antibiotics.  At the core of the patient's issue is he currently does not have a place to stay that has running water  and heat.  He tells me that he is going to get his heatup and running in his current living location and he needs a place to stay for the evening.  He tells me multiple friends that he has that are going to help him get his car fixed, and he tells me I have a deal with the people at [facility] about an electronic heating solution.   I explained to the  patient limitations of the emergency room for holding patient's for housing issues. I had social work come by to see the patient and provide him with some resources, but the patient was not interested in pursuing this.  I reached out to the patient's emergency contact, Heron Fabian, and she told me that the patient would be able to stay with her if he wished to.  I discussed this with the patient, he told me he would let rather sleep on the sidewalk.  The patient is alert and oriented x 3.  He does not have dementia, he is very sharp, however he does not seem to have a good plan for wintering in his current residence.  He told me he did not want me to call his son, and I will respect his wishes.  I told the patient that once he finishes his Dalvance, we can discharge him to the lobby, and he still prefers that over going to stay with his friend.  Reassessment 10:55 PM-I had another lengthy discussion with the patient.  I do have some concerns that the patient may be exhibiting some symptoms of depression.  Asked patient if he would like to talk to our psychiatry team and he tells me that he would.  I have placed a TTS referral.  He denies any SI or HI to me.  He is here voluntarily.   Mannie Pac  T, DO 09/20/24 2255    Mannie Pac T, DO 09/20/24 2323

## 2024-09-20 NOTE — BH Assessment (Signed)
 TTS Consult will be completed by [facility]. Per [facility] Coordinator, assessment scheduled for 1245 with Dr. Anastacio.

## 2024-09-20 NOTE — Discharge Instructions (Signed)
 You were treated for cellulitis in your leg with a medication called Dalvance.  This medication last for 1 week.  I have also sent you an additional medication to your pharmacy called Keflex .  You may take that for may take 4 times per day for the next week.  Please follow-up with your primary care doctor within 1 week.  Return to the emergency department for fever or worsening pain in your legs.

## 2024-09-21 DIAGNOSIS — Z1330 Encounter for screening examination for mental health and behavioral disorders, unspecified: Secondary | ICD-10-CM | POA: Diagnosis not present

## 2024-09-21 DIAGNOSIS — Z8659 Personal history of other mental and behavioral disorders: Secondary | ICD-10-CM | POA: Diagnosis not present

## 2024-09-21 DIAGNOSIS — Z6841 Body Mass Index (BMI) 40.0 and over, adult: Secondary | ICD-10-CM | POA: Diagnosis not present

## 2024-09-21 DIAGNOSIS — L03119 Cellulitis of unspecified part of limb: Secondary | ICD-10-CM | POA: Diagnosis not present

## 2024-09-21 DIAGNOSIS — R208 Other disturbances of skin sensation: Secondary | ICD-10-CM | POA: Diagnosis not present

## 2024-09-21 NOTE — Consult Note (Signed)
 [facility] Consult Note  Patient Name: [name] MRN: [id number] DOB: Sep 16, 1941 DATE OF Consult: 09/21/2024  PRIMARY PSYCHIATRIC DIAGNOSES  1.  Encounter for screening examination for mental health and behavioral disorders 2.  History of depression    RECOMMENDATIONS  Recommendations: Medication recommendations: Continue Wellbutrin  per outpatient primary provider  Non-Medication/therapeutic recommendations: Crisis line information; ED return precautions  Is inpatient psychiatric hospitalization recommended for this patient? No (Explain why): Denies suicidal and homicidal ideation, denies affective and psychotic symptoms  From a psychiatric perspective, is this patient appropriate for discharge to an outpatient setting/resource or other less restrictive environment for continued care?  Yes (Explain why): Denies suicidal and homicidal ideation, no affective or psychotic symptoms Follow-Up Telepsychiatry C/L services: We will sign off for now. Please re-consult our service if needed for any concerning changes in the patient's condition, discharge planning, or questions. Communication: Treatment team members (and family members if applicable) who were involved in treatment/care discussions and planning, and with whom we spoke or engaged with via secure text/chat, include the following: Team via Epic chat  Patient denies depressed mood, other depressive symptoms. Denies suicidal ideation, intent, plan. Denies symptoms consistent with mania/hypomania, paranoia, auditory and visual hallucinations, homicidal ideation. Patient knows the current President is Trump and the prior President was Carnella; able to state the days of the week backwards without error; recalls 3/3 words on 3-minute recall. Patient does not meet criteria for inpatient psychiatric hospitalization.   Thank you for involving us  in the care of this patient. If you have any additional questions or concerns, please call  [phone] and ask for me or the provider on-call.  TELEPSYCHIATRY ATTESTATION & CONSENT  As the provider for this telehealth consult, I attest that I verified the patient's identity using two separate identifiers, introduced myself to the patient, provided my credentials, disclosed my location, and performed this encounter via a HIPAA-compliant, real-time, face-to-face, two-way, interactive audio and video platform and with the full consent and agreement of the patient (or guardian as applicable.)  Patient physical location: ED in [facility]  Telehealth provider physical location: home office in state of [place]    Video start time: 0045 AM EST Video end time: 0103 AM EST   IDENTIFYING DATA  [name] is a 83 y.o. year-old male for whom a psychiatric consultation has been ordered by the primary provider. The patient was identified using two separate identifiers.  CHIEF COMPLAINT/REASON FOR CONSULT  concerns that the patient may be exhibiting some symptoms of depression   HISTORY OF PRESENT ILLNESS (HPI)  [name] is an 83 year old male with a history of depression, anxiety, back pain, BPH, CKD, arthritis, asthma, hypertension, hyperlipidemia who presents to the ED bilateral lower extremity edema increasing x2 weeks and burning rectal pain, also housing instability and requesting to remain in the ED for the night. Chart reviewed. Psychiatry consulted for evaluation of depression.   Per ED social worker note: Pt stable for dc. Pt reports not having adequate housing, stating that his loft lacks heat, making it uninhabitable under current weather conditions. CSW offered shelter resources and transportation assistance, which pt declined multiple times stating shelter is not a suitable solution. Pt stated that his PCP advised him to come to the ER to be seen and that pt should not anticipate being dc until the following day. Pt states that he has no friends or family local.  CSW educated the pt regarding the purpose of the ED, clarifying that it is  intended for medical emergencies rather than shelter placement. CSW staffed the case with EDP and informed that community resources was offered and declined. Per EDP, due to current weather conditions and the patient's age, dc will be deferred until the morning. No further ICM needs identified at this time.   On evaluation, patient noted to be calm, cooperative, euthymic, linear and goal directed, not appearing internally preoccupied, not responding to internal stimuli, alert and oriented x 3. Patient reports, I'm doing fine. I was asleep. Patient reports he came to the ED due to wounds that are not healing well and swelling. States he has been going to a wound center for treatment and goes once per week. He states in the past 6 weeks he has also been having trouble with constipation and/or diarrhea. Patient denies depressed mood. He states laughingly It's just a physical thing! He denies change in appetite, sleep, concentration, denies feelings of worthlessness, guilt/shame, hopelessness. Denies suicidal ideation, intent, plan. Patient has acces to firearms. Denies symptoms consistent with mania/hypomania, paranoia, auditory and visual hallucinations, homicidal ideation. Patient reports after his daughter died by suicide in 28-Sep-2000, at that time he felt depressed and started Wellbutrin . States he since lost another daughter in a motor vehicle accident. States Wellbutrin  is prescribed by his primary provider and he has been on it since the death of his daughter in Sep 28, 2000, but states he has not been depressed in recent years. Patient states, I'm not depressed, I'm just frustrated that we're not getting a handle on the wound here in the emergency room. Patient knows the current President is Trump and the prior President was Carnella; able to state the days of the week backwards without error; recalls 3/3 words on 3-minute recall. Patient states  he plans to go back to the wound center in the morning. 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 Reports he can return to his dwelling tomorrow and states he has told the ED staff he does not want shelter resources.    PAST PSYCHIATRIC HISTORY  Trauma/abuse/neglect/exploitation: Denies  Prior psych med trials: Wellbutrin , Remeron , Ativan    Suicide attempts: Denies Psychiatric hospitalizations: Denies   C-SSRS 1) In the past month have you wished you were dead or wished you could go to sleep and not wake up? []  Yes [x]   No 2) In the past month have you actually had any thoughts of killing yourself? []   Yes  [x]   No If YES to 2, ask questions 3, 4, 5, and 6. If NO to 2, go directly to question 6 3) In the past month have you been thinking about how you might do this? []   Yes  []   No 4) In the past month have you had these thoughts and had some intention of acting on them?  []   Yes []   No 5) In the past month have you started to work out or worked out the details of how to kill yourself? Do you intend to carry out this plan? []   Yes []   No 6) Have you ever done anything, started to do anything, or prepared to do anything to end your life? []   Yes [x]   No Otherwise as per HPI above.  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, L5 ,S1   Depression    takes Wellbutrin  daily   Dyspnea on exertion    with exertion   GERD (gastroesophageal reflux disease)    takes Nexium  and Omeprazole daily   History of kidney stones    HTN  (hypertension)    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   Hyperlipidemia    Joint pain    Neuromuscular disorder (HCC)    Pre-diabetes    Prediabetes    Sleep apnea    uses CPAP nightly   SOB (shortness of breath)    Swallowing difficulty    Swelling of lower extremity    right more than leg leg   Umbilical hernia      HOME MEDICATIONS  Facility Ordered Medications  Medication   [COMPLETED] iohexol  (OMNIPAQUE ) 300 MG/ML solution 80 mL   [COMPLETED] dalbavancin (DALVANCE) 1,500 mg in dextrose  5 % 500 mL IVPB   [COMPLETED] hydrocortisone (ANUSOL-HC) suppository 25 mg   fluticasone  (FLONASE ) 50 MCG/ACT nasal spray 2 spray   furosemide  (LASIX ) tablet 40 mg   gabapentin  (NEURONTIN ) capsule 300 mg   pantoprazole  (PROTONIX ) EC tablet 40 mg   potassium chloride  SA (KLOR-CON  M) CR tablet 40 mEq   rosuvastatin  (CRESTOR ) tablet 5 mg   cephALEXin  (KEFLEX ) capsule 500 mg   PTA Medications  Medication Sig   gabapentin  (NEURONTIN ) 300 MG capsule TAKE 1 TO 2 CAPSULES BY MOUTH AT BEDTIME   rosuvastatin  (CRESTOR ) 5 MG tablet Take 5 mg by mouth daily.   buPROPion  (WELLBUTRIN  XL) 150 MG 24 hr tablet Take 450 mg by mouth every morning.   ASTEPRO  205.5 MCG/SPRAY SOLN Place 2 sprays into both nostrils as needed.   fluticasone  (FLONASE  ALLERGY RELIEF) 50 MCG/ACT nasal spray Place 2 sprays into both nostrils as needed.   pantoprazole  (PROTONIX ) 40 MG tablet Take 1 tablet (40 mg total) by mouth 2 (two) times daily before a meal. (Patient not taking: Reported on 08/24/2024)   potassium chloride  SA (KLOR-CON  M) 20 MEQ tablet Take 2 tablets (40 mEq total) by mouth 2 (two) times daily.   furosemide  (LASIX ) 40 MG tablet TAKE 1 TABLET BY MOUTH EVERY MORNING AND AN ADDITIONAL TABLET DAILY AS NEEDED FOR UNRESOLVED SWELLING OR FLUID RETENTION     ALLERGIES  No Known Allergies  SOCIAL & SUBSTANCE USE HISTORY  Social History   Socioeconomic History   Marital status: Single    Spouse name: Not on  file   Number of children: 2   Years of education: Not on file   Highest education level: Not on file  Occupational History   Occupation: Antiques/Real Environmental Manager: RETIRED   Occupation: retired  Tobacco Use   Smoking status: Never   Smokeless tobacco: Never  Vaping Use   Vaping status: Never Used  Substance and Sexual Activity   Alcohol use: Yes    Comment: rare   Drug use: No   Sexual activity: Yes    Birth control/protection: Condom  Other Topics Concern   Not on file  Social History Narrative   ** Merged History Encounter **  Single. Education: Other.    Social Drivers of Corporate Investment Banker Strain: Not on file  Food Insecurity: No Food Insecurity (06/02/2024)   Hunger Vital Sign    Worried About Running Out of Food in the Last Year: Never true    Ran Out of Food in the Last Year: Never true  Transportation Needs: No Transportation Needs (06/02/2024)   PRAPARE - Administrator, Civil Service (Medical): No    Lack of Transportation (Non-Medical): No  Physical Activity: Not on file  Stress: Not on file  Social Connections: Moderately Integrated (06/02/2024)   Social Connection and Isolation Panel    Frequency of Communication with Friends and Family: Three times a week    Frequency of Social Gatherings with Friends and Family: Once a week    Attends Religious Services: 1 to 4 times per year    Active Member of [employer] or Organizations: No    Attends Engineer, Structural: 1 to 4 times per year    Marital Status: Divorced   Social History   Tobacco Use  Smoking Status Never  Smokeless Tobacco Never   Social History   Substance and Sexual Activity  Alcohol Use Yes   Comment: rare   Social History   Substance and Sexual Activity  Drug Use No     FAMILY HISTORY  Family History  Problem Relation Age of Onset   Thyroid  disease Mother    Heart disease Father    Hypertension Father    Diabetes Maternal Grandfather     Colon cancer Neg Hx    Esophageal cancer Neg Hx    Daughter: Died by suicide    MENTAL STATUS EXAM (MSE)  Mental Status Exam: General Appearance: Casual  Orientation:  Full (Time, Place, and Person)  Memory:  Immediate;   Good Recent;   Good  Concentration:  Concentration: Good  Recall:  Good  Attention  Good  Eye Contact:  Good  Speech:  Clear and Coherent  Language:  Good  Volume:  Normal  Mood: Good  Affect:  Full Range  Thought Process:  Coherent  Thought Content:  Abstract Reasoning and Computation  Suicidal Thoughts:  No  Homicidal Thoughts:  No  Judgement:  Fair  Insight:  Good  Psychomotor Activity:  Normal  Akathisia:  NA  Fund of Knowledge:  Good    Assets:  Communication Skills Resilience  Cognition:  WNL  ADL's:  Intact  AIMS (if indicated):       VITALS  Blood pressure (!) 147/78, pulse 88, temperature 98.8 F (37.1 C), temperature source Oral, resp. rate (!) 25, weight 123.2 kg, SpO2 95%.  LABS  Admission on 09/20/2024  Component Date Value Ref Range Status   Sodium 09/20/2024 141  135 - 145 mmol/L Final   Potassium 09/20/2024 3.5  3.5 - 5.1 mmol/L Final   Chloride 09/20/2024 103  98 - 111 mmol/L Final   CO2 09/20/2024 26  22 - 32 mmol/L Final   Glucose, Bld 09/20/2024 114 (H)  70 - 99 mg/dL Final   Glucose reference range applies only to samples taken after fasting for at least 8 hours.   BUN 09/20/2024 16  8 - 23 mg/dL Final   Creatinine, Ser 09/20/2024 1.74 (H)  0.61 - 1.24 mg/dL Final   Calcium  09/20/2024 9.8  8.9 - 10.3 mg/dL Final   GFR, Estimated 09/20/2024 38 (L)  >60 mL/min Final   Comment: (NOTE) Calculated using the CKD-EPI Creatinine Equation (  2021)    Anion gap 09/20/2024 12  5 - 15 Final   Performed at [facility], [address], [place], [place] [address]   Pro Brain Natriuretic Peptide 09/20/2024 231.0  <300.0 pg/mL Final   Comment: (NOTE) Age Group        Cut-Points    Interpretation  < 50 years     450  pg/mL       NT-proBNP > 450 pg/mL indicates                                ADHF is likely              50 to 75 years  900 pg/mL      NT-proBNP > 900 pg/mL indicates          ADHF is likely  > 75 years      1800 pg/mL     NT-proBNP > 1800 pg/mL indicates          ADHF is likely                           All ages    Results between       Indeterminate. Further clinical             300 and the cut-   information is needed to determine            point for age group   if ADHF is present.                                                             Elecsys proBNP II/ Elecsys proBNP II STAT           Cut-Point                       Interpretation  300 pg/mL                    NT-proBNP <300pg/mL indicates                             ADHF is not likely  Performed at [facility], [address], [place], [place] [address]    SARS Coronavirus 2 by RT PCR 09/20/2024 NEGATIVE  NEGATIVE Final   Comment: (NOTE) SARS-CoV-2 target nucleic acids are NOT DETECTED.  The SARS-CoV-2 RNA is generally detectable in upper respiratory specimens during the acute phase of infection. The lowest concentration of SARS-CoV-2 viral copies this assay can detect is 138 copies/mL. A negative result does not preclude SARS-Cov-2 infection and should not be used as the sole basis for treatment or other patient management decisions. A negative result may occur with  improper specimen collection/handling, submission of specimen other than nasopharyngeal swab, presence of viral mutation(s) within the areas targeted by this assay, and inadequate number of viral copies(<138 copies/mL). A negative result must be combined with clinical observations, patient history, and epidemiological information. The expected result is Negative.  Fact Sheet for Patients:  bloggercourse.com  Fact Sheet for Healthcare Providers:  seriousbroker.it  This test is  no                          t yet approved or cleared by the [place]  FDA and  has been authorized for detection and/or diagnosis of SARS-CoV-2 by FDA under an Emergency Use Authorization (EUA). This EUA will remain  in effect (meaning this test can be used) for the duration of the COVID-19 declaration under Section 564(b)(1) of the Act, 21 U.S.C.section 360bbb-3(b)(1), unless the authorization is terminated  or revoked sooner.       Influenza A by PCR 09/20/2024 NEGATIVE  NEGATIVE Final   Influenza B by PCR 09/20/2024 NEGATIVE  NEGATIVE Final   Comment: (NOTE) The Xpert Xpress SARS-CoV-2/FLU/RSV plus assay is intended as an aid in the diagnosis of influenza from Nasopharyngeal swab specimens and should not be used as a sole basis for treatment. Nasal washings and aspirates are unacceptable for Xpert Xpress SARS-CoV-2/FLU/RSV testing.  Fact Sheet for Patients: bloggercourse.com  Fact Sheet for Healthcare Providers: seriousbroker.it  This test is not yet approved or cleared by the [place]  FDA and has been authorized for detection and/or diagnosis of SARS-CoV-2 by FDA under an Emergency Use Authorization (EUA). This EUA will remain in effect (meaning this test can be used) for the duration of the COVID-19 declaration under Section 564(b)(1) of the Act, 21 U.S.C. section 360bbb-3(b)(1), unless the authorization is terminated or revoked.     Resp Syncytial Virus by PCR 09/20/2024 NEGATIVE  NEGATIVE Final   Comment: (NOTE) Fact Sheet for Patients: bloggercourse.com  Fact Sheet for Healthcare Providers: seriousbroker.it  This test is not yet approved or cleared by the [place]  FDA and has been authorized for detection and/or diagnosis of SARS-CoV-2 by FDA under an Emergency Use Authorization (EUA). This EUA will remain in effect (meaning this test can be used) for  the duration of the COVID-19 declaration under Section 564(b)(1) of the Act, 21 U.S.C. section 360bbb-3(b)(1), unless the authorization is terminated or revoked.  Performed at [facility], [address], [place], [place] [address]    WBC 09/20/2024 5.4  4.0 - 10.5 K/uL Final   RBC 09/20/2024 3.67 (L)  4.22 - 5.81 MIL/uL Final   Hemoglobin 09/20/2024 12.4 (L)  13.0 - 17.0 g/dL Final   HCT 88/88/7974 35.4 (L)  39.0 - 52.0 % Final   MCV 09/20/2024 96.5  80.0 - 100.0 fL Final   MCH 09/20/2024 33.8  26.0 - 34.0 pg Final   MCHC 09/20/2024 35.0  30.0 - 36.0 g/dL Final   RDW 88/88/7974 11.9  11.5 - 15.5 % Final   Platelets 09/20/2024 183  150 - 400 K/uL Final   nRBC 09/20/2024 0.0  0.0 - 0.2 % Final   Neutrophils Relative % 09/20/2024 67  % Final   Neutro Abs 09/20/2024 3.6  1.7 - 7.7 K/uL Final   Lymphocytes Relative 09/20/2024 21  % Final   Lymphs Abs 09/20/2024 1.1  0.7 - 4.0 K/uL Final   Monocytes Relative 09/20/2024 9  % Final   Monocytes Absolute 09/20/2024 0.5  0.1 - 1.0 K/uL Final   Eosinophils Relative 09/20/2024 3  % Final   Eosinophils Absolute 09/20/2024 0.2  0.0 - 0.5 K/uL Final   Basophils Relative 09/20/2024 0  % Final   Basophils Absolute 09/20/2024 0.0  0.0 - 0.1 K/uL Final   Immature Granulocytes 09/20/2024 0  % Final   Abs Immature Granulocytes 09/20/2024 0.02  0.00 - 0.07 K/uL  Final   Performed at [name], [address], [place], [place] [address]    PSYCHIATRIC REVIEW OF SYSTEMS (ROS)  ROS: Notable for the following relevant positive findings: See HPI  Additional findings:      Musculoskeletal: No abnormal movements observed      Gait & Station: Laying/Sitting      Pain Screening: Present - mild to moderate      Nutrition & Dental Concerns: n/a  RISK FORMULATION/ASSESSMENT  Is the patient experiencing any suicidal or homicidal ideations: No        Protective factors considered for safety management: Future  oriented, identifies reasons to live, history of managing stress without engaging in suicidal behavior   Risk factors/concerns considered for safety management:  Depression Age over 72 Male gender Unmarried  Is there a safety management plan with the patient and treatment team to minimize risk factors and promote protective factors: Yes           Explain: Follow up with primary care provider; crisis line information; ED return precautions  Is crisis care placement or psychiatric hospitalization recommended: No     Based on my current evaluation and risk assessment, patient is determined at this time to be at:  Low risk  *RISK ASSESSMENT Risk assessment is a dynamic process; it is possible that this patient's condition, and risk level, may change. This should be re-evaluated and managed over time as appropriate. Please re-consult psychiatric consult services if additional assistance is needed in terms of risk assessment and management. If your team decides to discharge this patient, please advise the patient how to best access emergency psychiatric services, or to call 911, if their condition worsens or they feel unsafe in any way.   Erla JAYSON Rase, MD Telepsychiatry Consult Services

## 2024-09-22 ENCOUNTER — Encounter (HOSPITAL_BASED_OUTPATIENT_CLINIC_OR_DEPARTMENT_OTHER): Admitting: General Surgery

## 2024-09-29 DIAGNOSIS — Z6841 Body Mass Index (BMI) 40.0 and over, adult: Secondary | ICD-10-CM | POA: Diagnosis not present

## 2024-09-29 DIAGNOSIS — L0390 Cellulitis, unspecified: Secondary | ICD-10-CM | POA: Diagnosis not present

## 2024-09-29 DIAGNOSIS — R600 Localized edema: Secondary | ICD-10-CM | POA: Diagnosis not present

## 2024-09-30 DIAGNOSIS — M1711 Unilateral primary osteoarthritis, right knee: Secondary | ICD-10-CM | POA: Diagnosis not present

## 2024-10-03 ENCOUNTER — Encounter (HOSPITAL_BASED_OUTPATIENT_CLINIC_OR_DEPARTMENT_OTHER): Payer: PRIVATE HEALTH INSURANCE | Attending: General Surgery | Admitting: General Surgery

## 2024-10-03 DIAGNOSIS — L97522 Non-pressure chronic ulcer of other part of left foot with fat layer exposed: Secondary | ICD-10-CM | POA: Insufficient documentation

## 2024-10-03 DIAGNOSIS — G629 Polyneuropathy, unspecified: Secondary | ICD-10-CM | POA: Diagnosis not present

## 2024-10-03 DIAGNOSIS — M14672 Charcot's joint, left ankle and foot: Secondary | ICD-10-CM | POA: Insufficient documentation

## 2024-10-03 DIAGNOSIS — L97422 Non-pressure chronic ulcer of left heel and midfoot with fat layer exposed: Secondary | ICD-10-CM | POA: Insufficient documentation

## 2024-10-03 DIAGNOSIS — S90415A Abrasion, left lesser toe(s), initial encounter: Secondary | ICD-10-CM | POA: Diagnosis not present

## 2024-10-03 DIAGNOSIS — L97819 Non-pressure chronic ulcer of other part of right lower leg with unspecified severity: Secondary | ICD-10-CM | POA: Diagnosis not present

## 2024-10-03 DIAGNOSIS — L03115 Cellulitis of right lower limb: Secondary | ICD-10-CM | POA: Diagnosis not present

## 2024-10-13 ENCOUNTER — Encounter (HOSPITAL_BASED_OUTPATIENT_CLINIC_OR_DEPARTMENT_OTHER): Payer: PRIVATE HEALTH INSURANCE | Admitting: General Surgery

## 2024-10-14 ENCOUNTER — Encounter (HOSPITAL_BASED_OUTPATIENT_CLINIC_OR_DEPARTMENT_OTHER): Attending: General Surgery | Admitting: General Surgery

## 2024-10-14 DIAGNOSIS — L97522 Non-pressure chronic ulcer of other part of left foot with fat layer exposed: Secondary | ICD-10-CM | POA: Insufficient documentation

## 2024-10-14 DIAGNOSIS — L97512 Non-pressure chronic ulcer of other part of right foot with fat layer exposed: Secondary | ICD-10-CM | POA: Diagnosis not present

## 2024-10-14 DIAGNOSIS — M14672 Charcot's joint, left ankle and foot: Secondary | ICD-10-CM | POA: Diagnosis not present

## 2024-10-14 DIAGNOSIS — L97422 Non-pressure chronic ulcer of left heel and midfoot with fat layer exposed: Secondary | ICD-10-CM | POA: Diagnosis present

## 2024-10-14 DIAGNOSIS — L03115 Cellulitis of right lower limb: Secondary | ICD-10-CM | POA: Diagnosis not present

## 2024-10-14 DIAGNOSIS — G629 Polyneuropathy, unspecified: Secondary | ICD-10-CM | POA: Diagnosis not present

## 2024-10-20 ENCOUNTER — Encounter (HOSPITAL_BASED_OUTPATIENT_CLINIC_OR_DEPARTMENT_OTHER): Payer: PRIVATE HEALTH INSURANCE | Admitting: Internal Medicine

## 2024-10-20 DIAGNOSIS — L97422 Non-pressure chronic ulcer of left heel and midfoot with fat layer exposed: Secondary | ICD-10-CM | POA: Diagnosis not present

## 2024-10-24 ENCOUNTER — Encounter (HOSPITAL_BASED_OUTPATIENT_CLINIC_OR_DEPARTMENT_OTHER): Payer: PRIVATE HEALTH INSURANCE | Admitting: Internal Medicine

## 2024-10-27 ENCOUNTER — Encounter (HOSPITAL_BASED_OUTPATIENT_CLINIC_OR_DEPARTMENT_OTHER): Payer: PRIVATE HEALTH INSURANCE | Admitting: General Surgery

## 2024-10-28 ENCOUNTER — Encounter (HOSPITAL_BASED_OUTPATIENT_CLINIC_OR_DEPARTMENT_OTHER): Admitting: General Surgery

## 2024-10-28 DIAGNOSIS — L97422 Non-pressure chronic ulcer of left heel and midfoot with fat layer exposed: Secondary | ICD-10-CM | POA: Diagnosis not present

## 2024-11-07 ENCOUNTER — Encounter (HOSPITAL_BASED_OUTPATIENT_CLINIC_OR_DEPARTMENT_OTHER): Admitting: General Surgery

## 2024-11-07 DIAGNOSIS — L97422 Non-pressure chronic ulcer of left heel and midfoot with fat layer exposed: Secondary | ICD-10-CM | POA: Diagnosis not present

## 2024-11-14 ENCOUNTER — Encounter (HOSPITAL_BASED_OUTPATIENT_CLINIC_OR_DEPARTMENT_OTHER): Payer: PRIVATE HEALTH INSURANCE | Admitting: General Surgery

## 2024-11-15 ENCOUNTER — Telehealth: Payer: Self-pay | Admitting: Pharmacist

## 2024-11-16 NOTE — Progress Notes (Signed)
" ° °  11/16/2024  Patient ID: [name] Apt, male   DOB: [birth year], 84 y.o.   MRN: [id number]  Received voicemail from patient stating he received a voicemail from me sometime before Christmas regarding PAP and that he was returning my call to discuss further. Last voicemail was left on 08/02/24, over 3+ months ago.   Called patient back- said he would like to re-discuss Ozempic PAP program. Advised he does not qualify for the program as it is only open to patients that are uninsured or have a Diabetes diagnosis. I would discuss further weight loss options with Dr. Aisha prior to sending an email regarding his options and cost.  Email sent stating below:  Good morning [name],  This is the pharmacist Emalynn Clewis with [employer]. We spoke yesterday regarding weight loss medications. I spoke with Dr. Aisha and she is very hesitant to recommend any option as they do have an increased risk of constipation. With your history of bowel issues, she is weary of recommending it but would prefer Zepbound if you are strongly encouraged to pursue an option for weight loss.   Zepbound has a 15-21% weight loss versus Wegovy/Semaglutide which is 10-16%. Most common side effects to note are: Constipation (6% to 17%), decreased appetite (5% to 11%), diarrhea (12% to 23%), nausea (12% to 29%), vomiting (5% to 13%). If you have any vomiting, we highly recommend discontinuing the medication and calling the office. This is a once weekly injection.   For pricing: Zepbound diplomatic services operational officer is cheapest: 2.5mg /0.35mL vial= $300 for 1 month 15mg /0.72mL vial= $450 (could last 6 months if doing the 2.5mg  dose) *However, to keep this pricing long-term you would have to buy the next vial within 45 days (or the 15mg  vial goes up to $995)  We can always re-discuss options at the 2-month mark if things work out. Unfortunately for the patient assistance program for Ozempic, you would need to have a Diabetes diagnosis, and  they are only covering uninsured patients for 2026. Also it's not the recommended medication that Sari would prefer for based on your medical history.  Hope this helps and let me know if you would like to try the Zepbound and we can get the process started. Thank you!   [name], PharmD, [facility] Phone Number: [phone]  "

## 2024-11-17 ENCOUNTER — Ambulatory Visit (INDEPENDENT_AMBULATORY_CARE_PROVIDER_SITE_OTHER): Admitting: Podiatry

## 2024-11-17 ENCOUNTER — Other Ambulatory Visit: Payer: Self-pay | Admitting: Physical Medicine and Rehabilitation

## 2024-11-17 ENCOUNTER — Telehealth: Payer: Self-pay | Admitting: Physical Medicine and Rehabilitation

## 2024-11-17 ENCOUNTER — Encounter: Payer: Self-pay | Admitting: Podiatry

## 2024-11-17 VITALS — Ht 67.0 in | Wt 271.5 lb

## 2024-11-17 DIAGNOSIS — M48062 Spinal stenosis, lumbar region with neurogenic claudication: Secondary | ICD-10-CM

## 2024-11-17 DIAGNOSIS — I872 Venous insufficiency (chronic) (peripheral): Secondary | ICD-10-CM

## 2024-11-17 DIAGNOSIS — L97522 Non-pressure chronic ulcer of other part of left foot with fat layer exposed: Secondary | ICD-10-CM

## 2024-11-17 DIAGNOSIS — M5416 Radiculopathy, lumbar region: Secondary | ICD-10-CM

## 2024-11-17 DIAGNOSIS — M21371 Foot drop, right foot: Secondary | ICD-10-CM

## 2024-11-17 NOTE — Telephone Encounter (Signed)
 Pt called stating want to get an injection with [facility]. Last injection was 8/1.25.Pt states has some update info that needs to be given ASAP. Please call pt at [phone]

## 2024-11-17 NOTE — Progress Notes (Unsigned)
 mri foot left

## 2024-11-21 ENCOUNTER — Encounter (HOSPITAL_BASED_OUTPATIENT_CLINIC_OR_DEPARTMENT_OTHER): Payer: PRIVATE HEALTH INSURANCE | Admitting: General Surgery

## 2024-11-24 ENCOUNTER — Inpatient Hospital Stay: Admission: RE | Admit: 2024-11-24 | Discharge: 2024-11-24 | Attending: Podiatry

## 2024-11-24 ENCOUNTER — Encounter

## 2024-11-24 DIAGNOSIS — L97522 Non-pressure chronic ulcer of other part of left foot with fat layer exposed: Secondary | ICD-10-CM

## 2024-11-25 ENCOUNTER — Ambulatory Visit: Payer: Self-pay | Admitting: Podiatry

## 2024-11-28 ENCOUNTER — Encounter (HOSPITAL_BASED_OUTPATIENT_CLINIC_OR_DEPARTMENT_OTHER): Payer: PRIVATE HEALTH INSURANCE | Admitting: General Surgery

## 2024-11-28 ENCOUNTER — Telehealth: Payer: Self-pay

## 2024-11-28 NOTE — Telephone Encounter (Signed)
 Patient called and left a message -returning Dr. Jameson call. He needs an appointment to go over MRI results. Please call to schedule. Thanks

## 2024-11-29 ENCOUNTER — Ambulatory Visit: Admitting: Podiatry

## 2024-11-29 NOTE — Telephone Encounter (Signed)
 Left a VM for the patient, letting him know he was missed at his appt today & to call us  back if he would like to reschedule with Dr. Gershon to go over his MRI results.

## 2024-11-29 NOTE — Telephone Encounter (Signed)
 Patient was a no show today for appointment. Can we try to reach out to reschedule if not done so already? Thanks!

## 2024-11-30 NOTE — Telephone Encounter (Signed)
 Patient called back in to reschedule, he siad he had, had his days confused. He was put in an urgent slot for 1/22 at 11:30 for pain & wound dressing.   *Pain in bottom of L foot *Replace bandages *Discuss MRI results

## 2024-12-01 ENCOUNTER — Ambulatory Visit: Admitting: Podiatry

## 2024-12-01 DIAGNOSIS — L97522 Non-pressure chronic ulcer of other part of left foot with fat layer exposed: Secondary | ICD-10-CM | POA: Diagnosis not present

## 2024-12-01 DIAGNOSIS — M86272 Subacute osteomyelitis, left ankle and foot: Secondary | ICD-10-CM | POA: Diagnosis not present

## 2024-12-01 NOTE — Patient Instructions (Signed)
 Pre-Operative Instructions  Congratulations, you have decided to take an important step to improving your quality of life.  You can be assured that the doctors of [facility] will be with you every step of the way.  Plan to be at the surgery center/hospital at least 1 (one) hour prior to your scheduled time unless otherwise directed by the surgical center/hospital staff.  You must have a responsible adult accompany you, remain during the surgery and drive you home.  Make sure you have directions to the surgical center/hospital and know how to get there on time. For hospital based surgery you will need to obtain a history and physical form from your family physician within 1 month prior to the date of surgery- we will give you a form for you primary physician.  We make every effort to accommodate the date you request for surgery.  There are however, times where surgery dates or times have to be moved.  We will contact you as soon as possible if a change in schedule is required.   No Aspirin/Ibuprofen for one week before surgery.  If you are on aspirin, any non-steroidal anti-inflammatory medications (Mobic, Aleve, Ibuprofen) you should stop taking it 7 days prior to your surgery.  You make take Tylenol   For pain prior to surgery.  Medications- If you are taking daily heart and blood pressure medications, seizure, reflux, allergy, asthma, anxiety, pain or diabetes medications, make sure the surgery center/hospital is aware before the day of surgery so they may notify you which medications to take or avoid the day of surgery. No food or drink after midnight the night before surgery unless directed otherwise by surgical center/hospital staff. No alcoholic beverages 24 hours prior to surgery.  No smoking 24 hours prior to or 24 hours after surgery. Wear loose pants or shorts- loose enough to fit over bandages, boots, and casts. No slip on shoes, sneakers are best. Bring your boot with you to the  surgery center/hospital.  Also bring crutches or a walker if your physician has prescribed it for you.  If you do not have this equipment, it will be provided for you after surgery. If you have not been contracted by the surgery center/hospital by the day before your surgery, call to confirm the date and time of your surgery. Leave-time from work may vary depending on the type of surgery you have.  Appropriate arrangements should be made prior to surgery with your employer. Prescriptions will be provided immediately following surgery by your doctor.  Have these filled as soon as possible after surgery and take the medication as directed. Remove nail polish on the operative foot. Wash the night before surgery.  The night before surgery wash the foot and leg well with the antibacterial soap provided and water paying special attention to beneath the toenails and in between the toes.  Rinse thoroughly with water and dry well with a towel.  Perform this wash unless told not to do so by your physician.  Enclosed: 1 Ice pack (please put in freezer the night before surgery)   1 Hibiclens skin cleaner   Pre-op Instructions  If you have any questions regarding the instructions, do not hesitate to call our office at any point during this process.   [place]: [address] [place], [place] [address] [phone]  [place]: [address] [place], [place] [address] [phone]  Dr. Donnice Fees, DPM

## 2024-12-02 ENCOUNTER — Other Ambulatory Visit: Payer: Self-pay | Admitting: Physician Assistant

## 2024-12-05 ENCOUNTER — Encounter (HOSPITAL_BASED_OUTPATIENT_CLINIC_OR_DEPARTMENT_OTHER): Payer: PRIVATE HEALTH INSURANCE | Admitting: General Surgery

## 2024-12-05 ENCOUNTER — Ambulatory Visit: Admitting: Podiatry

## 2024-12-05 NOTE — Progress Notes (Signed)
 Subjective: Chief Complaint  Patient presents with   Foot Ulcer    Ulcerated, foot, left, with fat layer exposed      84 year old male, presents today for follow-up evaluation of ulceration of the left foot and to discuss MRI results.  He states that he has appointments next And that is why he did not come to his last appointments.  He currently does not report any fevers or chills.   Objective: AAO x3, NAD DP/PT pulses palpable bilaterally, CRT less than 3 seconds On the plantar aspect left foot in the arch hyperkeratotic lesion with central granular wound.  Full-thickness granular wound noted on the plantar aspect of the left midfoot without any probing to bone, undermining or tunneling.  There are some skin irritation adjacent to the wound today likely from the bandage as it has not been changed.  On the right anterior leg there is also a superficial abrasion.  No pain with calf compression, warmth, erythema    Assessment: 84 year old male with venous insufficiency, left foot ulceration  Plan:  Left foot ulceration, concern for osteomyelitis -We reviewed the MRI and I had a long discussion with him regards to his treatment options.  I do think that surgery will be beneficial eventually to help decrease the pressure which I think is causing the ulceration however we need to make sure there is no infection.  I discussed with him the bone biopsy next week.  He is currently off of antibiotics and elected to hold off on them for now to get a accurate culture.  Discussed with him staged surgery. -The incision placement as well as the postoperative course was discussed with the patient. I discussed risks of the surgery which include, but not limited to, infection, bleeding, pain, swelling, need for further surgery, delayed or nonhealing, painful or ugly scar, numbness or sensation changes,  recurrence, transfer lesions,  DVT/PE, loss of toe/foot. Patient understands these risks and wishes to  proceed with surgery. The surgical consent was reviewed with the patient all 3 pages were signed. No promises or guarantees were given to the outcome of the procedure. All questions were answered to the best of my ability. Before the surgery the patient was encouraged to call the office if there is any further questions. The surgery will be performed an outpatient basis.  No follow-ups on file.  Donnice JONELLE Fees DPM

## 2024-12-06 ENCOUNTER — Telehealth: Payer: Self-pay | Admitting: Podiatry

## 2024-12-06 NOTE — Telephone Encounter (Signed)
 Called and left message for patient to call back and schedule date for surgery.

## 2024-12-08 ENCOUNTER — Other Ambulatory Visit: Payer: Self-pay

## 2024-12-08 ENCOUNTER — Ambulatory Visit: Payer: PRIVATE HEALTH INSURANCE | Admitting: Physical Medicine and Rehabilitation

## 2024-12-08 VITALS — BP 119/65 | HR 80

## 2024-12-08 DIAGNOSIS — M5416 Radiculopathy, lumbar region: Secondary | ICD-10-CM | POA: Diagnosis not present

## 2024-12-08 NOTE — Progress Notes (Signed)
 Pain Scale   Average Pain 5 Patient advising he having lower back pain that is constant        +Driver, -BT, -Dye Allergies.

## 2024-12-12 NOTE — Progress Notes (Signed)
 "  [name] - 84 y.o. male MRN [id number]  Date of birth: April 21, 1941  Office Visit Note: Visit Date: 12/08/2024 PCP: Aisha Harvey, MD Referred by: Aisha Harvey, MD  Subjective: Chief Complaint  Patient presents with   Lower Back - Pain   HPI:  [name] is a 84 y.o. male who comes in today for planned repeat Bilateral L4-5  Lumbar Transforaminal epidural steroid injection with fluoroscopic guidance.  The patient has failed conservative care including home exercise, medications, time and activity modification.  This injection will be diagnostic and hopefully therapeutic.  Please see requesting physician notes for further details and justification. Patient received more than 50% pain relief from prior injection.   Referring: Duwaine Pouch, FNP   ROS Otherwise per HPI.  Assessment & Plan: Visit Diagnoses:    ICD-10-CM   1. Lumbar radiculopathy  M54.16 XR C-ARM NO REPORT    Epidural Steroid injection      Plan: No additional findings.   Meds & Orders: No orders of the defined types were placed in this encounter.   Orders Placed This Encounter  Procedures   XR C-ARM NO REPORT   Epidural Steroid injection    Follow-up: Return for visit to requesting provider as needed.   Procedures: No procedures performed  Lumbosacral Transforaminal Epidural Steroid Injection - Sub-Pedicular Approach with Fluoroscopic Guidance  Patient: [name]      Date of Birth: [birth year] MRN: [id number] PCP: Aisha Harvey, MD      Visit Date: 12/08/2024   Universal Protocol:    Date/Time: 12/08/2024  Consent Given By: the patient  Position: PRONE  Additional Comments: Vital signs were monitored before and after the procedure. Patient was prepped and draped in the usual sterile fashion. The correct patient, procedure, and site was verified.   Injection Procedure Details:   Procedure diagnoses: Lumbar radiculopathy [M54.16]    Meds Administered: 40mg   DepoMedrol  Laterality: Bilateral  Location/Site: L4  Needle:5.0 in., 22 ga.  Short bevel or Quincke spinal needle  Needle Placement: Transforaminal  Findings:    -Comments: Excellent flow of contrast along the nerve, nerve root and into the epidural space.  Procedure Details: After squaring off the end-plates to get a true AP view, the C-arm was positioned so that an oblique view of the foramen as noted above was visualized. The target area is just inferior to the nose of the scotty dog or sub pedicular. The soft tissues overlying this structure were infiltrated with 2-3 ml. of 1% Lidocaine  without Epinephrine .  The spinal needle was inserted toward the target using a trajectory view along the fluoroscope beam.  Under AP and lateral visualization, the needle was advanced so it did not puncture dura and was located close the 6 O'Clock position of the pedical in AP tracterory. Biplanar projections were used to confirm position. Aspiration was confirmed to be negative for CSF and/or blood. A 1-2 ml. volume of Isovue -250 was injected and flow of contrast was noted at each level. Radiographs were obtained for documentation purposes.   After attaining the desired flow of contrast documented above, a 0.5 to 1.0 ml test dose of 0.25% Marcaine  was injected into each respective transforaminal space.  The patient was observed for 90 seconds post injection.  After no sensory deficits were reported, and normal lower extremity motor function was noted,   the above injectate was administered so that equal amounts of the injectate were placed at each foramen (level) into the transforaminal epidural space.  Additional Comments:  The patient tolerated the procedure well Dressing: 2 x 2 sterile gauze and Band-Aid    Post-procedure details: Patient was observed during the procedure. Post-procedure instructions were reviewed.  Patient left the clinic in stable condition.    Clinical History: CT  LUMBAR MYELOGRAM FINDINGS:     T10-11: No stenosis. Facet degeneration and ligamentous  calcification. Solid bridging anterior osteophytes.     T11-12: No stenosis.     T12-L1: Mild bulging of the disc. No compressive stenosis. Conus tip  at this level.     L1-2: 2 mm retrolisthesis. Moderate multifactorial spinal stenosis.  Disc space narrowing. Endplate osteophytes. Facet and ligamentous  hypertrophy. Foraminal stenosis on both sides that could compress  the L1 nerves.     L2-3: 2 mm retrolisthesis. Chronic disc degeneration with loss of  disc height and vacuum phenomenon. Endplate osteophytes. Facet and  ligamentous hypertrophy. Severe multifactorial spinal stenosis  likely to cause neural compression. Bilateral foraminal stenosis  that could compress the L2 nerves.     L3-4: 2 mm retrolisthesis. Endplate osteophytes and bulging of the  disc. Facet and ligamentous hypertrophy. Severe multifactorial  spinal stenosis likely to cause neural compression. Bilateral  foraminal stenosis that could affect the exiting L3 nerves.     L4-5: Chronic facet arthropathy with anterolisthesis of 1 cm. Disc  degeneration with disc space narrowing and vacuum phenomenon.  Endplate osteophytes and bulging of the disc. Severe stenosis of the  central canal and both neural foramina likely to cause neural  compression.     L5-S1: Chronic disc degeneration with loss of disc height and vacuum  phenomenon. Endplate osteophytes. Facet hypertrophy and degeneration  with some ligamentous calcification. Severe stenosis of the  subarticular lateral recesses and neural foramina.     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 Curvature convex to the left in the thoracolumbar  junction region and towards the right in the lower lumbar region. 2  mm retrolisthesis L1-2, L2-3 and L3-4. 1 cm anterolisthesis L4-5.     Severe multifactorial spinal stenosis and  neural foraminal stenosis  likely to cause neural compression at L2-3, L3-4, L4-5 and L5-S1.  Moderate multifactorial canal stenosis at L1-2. Bilateral foraminal  stenosis at that level.        Electronically Signed    By: Oneil Officer M.D.    On: 12/06/2019 10:12     Objective:  VS:  HT:    WT:   BMI:     BP:119/65  HR:80bpm  TEMP: ( )  RESP:  Physical Exam Vitals and nursing note reviewed.  Constitutional:      General: He is not in acute distress.    Appearance: Normal appearance. He is obese. He is not ill-appearing.  HENT:     Head: Normocephalic and atraumatic.     Right Ear: External ear normal.     Left Ear: External ear normal.     Nose: No congestion.  Eyes:     Extraocular Movements: Extraocular movements intact.  Cardiovascular:     Rate and Rhythm: Normal rate.     Pulses: Normal pulses.  Pulmonary:     Effort: Pulmonary effort is normal. No respiratory distress.  Abdominal:     General: There is no distension.     Palpations: Abdomen is soft.  Musculoskeletal:        General: Tenderness present. No signs of injury.     Cervical back: Neck supple.  Right lower leg: No edema.     Left lower leg: No edema.     Comments: Patient ambulates with a cane with a foot drop.  Both lower extremities are wrapped.  Patient has significant vascular disease.  He ambulates with difficulty with a forward flexed lumbar spine.  He has a great deal of difficulty going from sit to stand.  Skin:    Findings: No erythema or rash.  Neurological:     General: No focal deficit present.     Mental Status: He is alert and oriented to person, place, and time.     Cranial Nerves: No cranial nerve deficit.     Sensory: Sensory deficit present.     Motor: Weakness present. No abnormal muscle tone.     Coordination: Coordination normal.     Gait: Gait abnormal.  Psychiatric:        Mood and Affect: Mood normal.        Behavior: Behavior normal.      Imaging: No results  found. "

## 2024-12-13 ENCOUNTER — Telehealth: Payer: Self-pay | Admitting: Podiatry

## 2024-12-13 NOTE — Telephone Encounter (Signed)
 Patient would like to speak with provider, Please contact patient, [phone].

## 2024-12-14 NOTE — Telephone Encounter (Signed)
 Called patient per our conversation yesterday to see if he was able to get an appointment for surgical clearance. Left message for patient with my direct extension to contact me back.

## 2024-12-15 NOTE — Telephone Encounter (Signed)
 Sending patient surgical packet to [facility] to see if we are able to do surgery there instead of at hospital. Adding on even though patient has not returned my call per Dr.Wagoner

## 2024-12-26 ENCOUNTER — Encounter: Admitting: Podiatry

## 2025-01-09 ENCOUNTER — Encounter: Admitting: Podiatry

## 2025-01-23 ENCOUNTER — Encounter: Admitting: Podiatry
